# Patient Record
Sex: Male | Born: 1980 | Race: White | Hispanic: No | Marital: Married
Health system: Southern US, Academic
[De-identification: ages and names within clinical notes are randomized; demographics above are authoritative.]

## PROBLEM LIST (undated history)

## (undated) ENCOUNTER — Encounter: Attending: Internal Medicine | Primary: Internal Medicine

## (undated) ENCOUNTER — Encounter: Attending: Adult Health | Primary: Adult Health

## (undated) ENCOUNTER — Ambulatory Visit: Payer: MEDICAID

## (undated) ENCOUNTER — Telehealth

## (undated) ENCOUNTER — Encounter

## (undated) ENCOUNTER — Telehealth: Attending: Infectious Disease | Primary: Infectious Disease

## (undated) ENCOUNTER — Encounter: Attending: Hematology | Primary: Hematology

## (undated) ENCOUNTER — Ambulatory Visit

## (undated) ENCOUNTER — Encounter: Attending: Infectious Disease | Primary: Infectious Disease

## (undated) ENCOUNTER — Encounter
Attending: Student in an Organized Health Care Education/Training Program | Primary: Student in an Organized Health Care Education/Training Program

## (undated) ENCOUNTER — Ambulatory Visit: Payer: MEDICAID | Attending: Vascular Surgery | Primary: Vascular Surgery

## (undated) ENCOUNTER — Ambulatory Visit: Payer: MEDICAID | Attending: Adult Health | Primary: Adult Health

## (undated) ENCOUNTER — Ambulatory Visit: Payer: MEDICAID | Attending: Infectious Disease | Primary: Infectious Disease

## (undated) ENCOUNTER — Ambulatory Visit
Payer: MEDICAID | Attending: Student in an Organized Health Care Education/Training Program | Primary: Student in an Organized Health Care Education/Training Program

## (undated) ENCOUNTER — Telehealth: Attending: Adult Health | Primary: Adult Health

## (undated) ENCOUNTER — Encounter: Attending: Pharmacist | Primary: Pharmacist

## (undated) ENCOUNTER — Other Ambulatory Visit

## (undated) ENCOUNTER — Ambulatory Visit: Payer: MEDICAID | Attending: Hematology | Primary: Hematology

## (undated) ENCOUNTER — Telehealth
Attending: Student in an Organized Health Care Education/Training Program | Primary: Student in an Organized Health Care Education/Training Program

## (undated) ENCOUNTER — Telehealth: Attending: Dermatology | Primary: Dermatology

## (undated) ENCOUNTER — Encounter: Attending: Surgery | Primary: Surgery

## (undated) ENCOUNTER — Ambulatory Visit: Payer: MEDICAID | Attending: Physical Medicine & Rehabilitation | Primary: Physical Medicine & Rehabilitation

## (undated) ENCOUNTER — Encounter: Attending: Dermatology | Primary: Dermatology

## (undated) ENCOUNTER — Ambulatory Visit: Payer: MEDICAID | Attending: Internal Medicine | Primary: Internal Medicine

## (undated) ENCOUNTER — Encounter: Attending: General Acute Care Hospital | Primary: General Acute Care Hospital

## (undated) ENCOUNTER — Telehealth: Attending: Internal Medicine | Primary: Internal Medicine

## (undated) ENCOUNTER — Ambulatory Visit: Payer: MEDICAID | Attending: Registered" | Primary: Registered"

## (undated) ENCOUNTER — Encounter: Attending: Psychiatric/Mental Health | Primary: Psychiatric/Mental Health

## (undated) ENCOUNTER — Ambulatory Visit: Attending: Physical Medicine & Rehabilitation | Primary: Physical Medicine & Rehabilitation

## (undated) ENCOUNTER — Telehealth: Attending: Pharmacist | Primary: Pharmacist

## (undated) ENCOUNTER — Encounter: Attending: Vascular Surgery | Primary: Vascular Surgery

## (undated) ENCOUNTER — Encounter: Attending: Hematology & Oncology | Primary: Hematology & Oncology

## (undated) ENCOUNTER — Ambulatory Visit: Payer: MEDICAID | Attending: Otolaryngology | Primary: Otolaryngology

## (undated) ENCOUNTER — Telehealth: Attending: Surgery | Primary: Surgery

## (undated) ENCOUNTER — Ambulatory Visit: Attending: Psychiatric/Mental Health | Primary: Psychiatric/Mental Health

## (undated) ENCOUNTER — Ambulatory Visit
Payer: MEDICAID | Attending: Rehabilitative and Restorative Service Providers" | Primary: Rehabilitative and Restorative Service Providers"

## (undated) ENCOUNTER — Telehealth: Attending: Hematology | Primary: Hematology

## (undated) ENCOUNTER — Encounter: Attending: Ophthalmology | Primary: Ophthalmology

## (undated) ENCOUNTER — Inpatient Hospital Stay

## (undated) ENCOUNTER — Ambulatory Visit: Payer: MEDICAID | Attending: Surgery | Primary: Surgery

## (undated) ENCOUNTER — Institutional Professional Consult (permissible substitution): Payer: MEDICAID | Attending: Pharmacist | Primary: Pharmacist

## (undated) ENCOUNTER — Telehealth: Attending: Registered" | Primary: Registered"

## (undated) ENCOUNTER — Encounter: Attending: Registered" | Primary: Registered"

## (undated) ENCOUNTER — Encounter: Attending: Clinical | Primary: Clinical

## (undated) ENCOUNTER — Ambulatory Visit: Payer: MEDICAID | Attending: Pharmacist | Primary: Pharmacist

## (undated) DIAGNOSIS — G4730 Sleep apnea, unspecified: Secondary | ICD-10-CM

## (undated) DIAGNOSIS — C801 Malignant (primary) neoplasm, unspecified: Secondary | ICD-10-CM

## (undated) DIAGNOSIS — Z87442 Personal history of urinary calculi: Secondary | ICD-10-CM

## (undated) DIAGNOSIS — F419 Anxiety disorder, unspecified: Secondary | ICD-10-CM

## (undated) DIAGNOSIS — R519 Headache, unspecified: Secondary | ICD-10-CM

## (undated) DIAGNOSIS — N200 Calculus of kidney: Secondary | ICD-10-CM

## (undated) DIAGNOSIS — R51 Headache: Secondary | ICD-10-CM

## (undated) HISTORY — PX: WISDOM TOOTH EXTRACTION: SHX21

## (undated) HISTORY — DX: Malignant (primary) neoplasm, unspecified: C80.1

## (undated) HISTORY — PX: APPENDECTOMY: SHX54

## (undated) MED ORDER — TRAMADOL 50 MG TABLET: Freq: Two times a day (BID) | ORAL | 0.00000 days | Status: SS | PRN

## (undated) MED ORDER — GABAPENTIN 300 MG CAPSULE: Freq: Three times a day (TID) | ORAL | 0 days

## (undated) MED ORDER — HYDROXYZINE HCL 25 MG TABLET: Freq: Three times a day (TID) | ORAL | 0 days

## (undated) NOTE — *Deleted (*Deleted)
History and Physical    Brad Mcfarland VOZ:[id number] DOB: April 25, 1980 DOA: 01/20/2020  PCP: Mick Sell, MD   Patient coming from: Home  I have personally briefly reviewed patient's old medical records in [facility]  Chief Complaint: Dizziness/fatigue  HPI: Brad Mcfarland is a 69 y.o. male with medical history significant for anxiety disorder, PTSD, history of migraines and kidney stones who presents to the emergency room with a 5-day history of cough, myalgias, fatigue, dizziness and chest pain.  He denies having any fever but complains of excessive sweating.  He has not had any improvement despite taking DayQuil and NyQuil. He denies having any sick contacts, denies having any nausea, vomiting, abdominal pain or changes in his bowel habits.  He denies having any urinary symptoms. Labs show sodium 134, potassium 4.1, chloride 98, bicarb 22, BUN 17, creatinine 1.37, calcium 9.2, alkaline phosphatase 248, albumin 3.9, AST 41, ALT 58, total protein 7.6, total bilirubin 3.4, lactic acid 2.2 >> 1.8, white count 68,000 with marked left shift, hemoglobin 10.1, hematocrit 28.7, MCV 82.9, RDW 16.5, platelet count 21,000 Respiratory viral panel still pending Twelve-lead EKG reviewed by me shows sinus rhythm.    ED Course: ***  Review of Systems: As per HPI otherwise 10 point review of systems negative. ***   Past Medical History:  Diagnosis Date  . Anxiety    hx of panic attacks  . Headache    hx of migraines  . History of kidney stones   . Kidney stones   . Sleep apnea     Past Surgical History:  Procedure Laterality Date  . APPENDECTOMY    . KNEE ARTHROSCOPY Left 04/22/2017  . UMBILICAL HERNIA REPAIR N/A 04/11/2018   Procedure: HERNIA REPAIR UMBILICAL WITH MESH;  Surgeon: Sung Amabile, DO;  Location: [facility] ORS;  Service: General;  Laterality: N/A;  . WISDOM TOOTH EXTRACTION       reports that he has been smoking cigarettes. He has been smoking about 2.00 packs per  day. He has never used smokeless tobacco. He reports current alcohol use. He reports that he does not use drugs.  Allergies  Allergen Reactions  . Pepto-Bismol [Bismuth] Nausea And Vomiting    No family history on file.   Prior to Admission medications   Medication Sig Start Date End Date Taking? Authorizing Provider  ALPRAZolam (XANAX) 0.25 MG tablet Take 0.25 mg by mouth 2 (two) times daily as needed.  01/09/20   [provider]  baclofen (LIORESAL) 10 MG tablet Take 1 tablet (10 mg total) by mouth daily. Patient not taking: Reported on 01/20/2020 04/24/19 04/23/20  Faythe Ghee, PA-C  buPROPion (WELLBUTRIN XL) 150 MG 24 hr tablet Take 150 mg by mouth daily. 10/30/19   [provider]  DULoxetine HCl 40 MG CPEP Take 1 capsule by mouth daily. 01/14/20   [provider]  gabapentin (NEURONTIN) 300 MG capsule Take 300 mg by mouth at bedtime. 10/15/19   [provider]  HYDROcodone-acetaminophen (NORCO/VICODIN) 5-325 MG tablet Take 1 tablet by mouth every 6 (six) hours as needed for moderate pain. Patient not taking: Reported on 01/20/2020 04/24/19   Faythe Ghee, PA-C  predniSONE (STERAPRED UNI-PAK 48 TAB) 10 MG (48) TBPK tablet Take 6 pills for 2 days, 5 pills x 2d, 4 pills x 2d, 3 pills x 2d, 2 pills x 2d, 1 pill x 2d Patient not taking: Reported on 01/20/2020 04/24/19   Faythe Ghee, PA-C  traMADol (ULTRAM) 50 MG  tablet Take 50 mg by mouth daily as needed for moderate pain.  12/23/19   [provider]    Physical Exam: Vitals:   01/20/20 1405 01/20/20 1535 01/20/20 1550 01/20/20 1600  BP:  140/85 138/82 (!) 151/89  Pulse:  100 97 100  Resp:  16  (!) 24  Temp:      TempSrc:      SpO2:  100% 99% 98%  Weight: 124.7 kg     Height: 6\' 2"  (1.88 m)        Vitals:   01/20/20 1405 01/20/20 1535 01/20/20 1550 01/20/20 1600  BP:  140/85 138/82 (!) 151/89  Pulse:  100 97 100  Resp:  16  (!) 24  Temp:      TempSrc:      SpO2:  100% 99%  98%  Weight: 124.7 kg     Height: 6\' 2"  (1.88 m)       Constitutional: NAD, alert and oriented x *** Eyes: PERRL, lids and conjunctivae normal ENMT: Mucous membranes are moist.  Neck: normal, supple, no masses, no thyromegaly Respiratory: clear to auscultation bilaterally, no wheezing, no crackles. Normal respiratory effort. No accessory muscle use. *** Cardiovascular: Regular rate and rhythm,*** no murmurs / rubs / gallops. No extremity edema. 2+ pedal pulses. No carotid bruits. *** Abdomen: no tenderness, no masses palpated. No hepatosplenomegaly. Bowel sounds positive.  Musculoskeletal: no clubbing / cyanosis. No joint deformity upper and lower extremities.  Skin: no rashes, lesions, ulcers.  Neurologic: No gross focal neurologic deficit.*** Psychiatric: Normal mood and affect.   Labs on Admission: I have personally reviewed following labs and imaging studies  CBC: Recent Labs  Lab 01/20/20 1416  WBC 68.0*  NEUTROABS 0.7*  HGB 10.1*  HCT 28.7*  MCV 82.9  PLT 21*   Basic Metabolic Panel: Recent Labs  Lab 01/20/20 1416  NA 134*  K 4.1  CL 98  CO2 22  GLUCOSE 96  BUN 17  CREATININE 1.37*  CALCIUM 9.2   GFR: Estimated Creatinine Clearance: 100.6 mL/min (A) (by C-G formula based on SCr of 1.37 mg/dL (H)). Liver Function Tests: Recent Labs  Lab 01/20/20 1416  AST 41  ALT 58*  ALKPHOS 248*  BILITOT 3.4*  PROT 7.6  ALBUMIN 3.9   No results for input(s): LIPASE, AMYLASE in the last 168 hours. No results for input(s): AMMONIA in the last 168 hours. Coagulation Profile: No results for input(s): INR, PROTIME in the last 168 hours. Cardiac Enzymes: No results for input(s): CKTOTAL, CKMB, CKMBINDEX, TROPONINI in the last 168 hours. BNP (last 3 results) No results for input(s): PROBNP in the last 8760 hours. HbA1C: No results for input(s): HGBA1C in the last 72 hours. CBG: No results for input(s): GLUCAP in the last 168 hours. Lipid Profile: No results for  input(s): CHOL, HDL, LDLCALC, TRIG, CHOLHDL, LDLDIRECT in the last 72 hours. Thyroid Function Tests: No results for input(s): TSH, T4TOTAL, FREET4, T3FREE, THYROIDAB in the last 72 hours. Anemia Panel: No results for input(s): VITAMINB12, FOLATE, FERRITIN, TIBC, IRON, RETICCTPCT in the last 72 hours. Urine analysis:    Component Value Date/Time   COLORURINE YELLOW (A) 04/07/2018 1827   APPEARANCEUR CLEAR (A) 04/07/2018 1827   APPEARANCEUR Clear 12/05/2013 1016   LABSPEC 1.025 04/07/2018 1827   LABSPEC 1.021 12/05/2013 1016   PHURINE 5.0 04/07/2018 1827   GLUCOSEU NEGATIVE 04/07/2018 1827   GLUCOSEU Negative 12/05/2013 1016   HGBUR NEGATIVE 04/07/2018 1827   BILIRUBINUR NEGATIVE 04/07/2018 1827  BILIRUBINUR Negative 12/05/2013 1016   KETONESUR NEGATIVE 04/07/2018 1827   PROTEINUR NEGATIVE 04/07/2018 1827   NITRITE NEGATIVE 04/07/2018 1827   LEUKOCYTESUR NEGATIVE 04/07/2018 1827   LEUKOCYTESUR Negative 12/05/2013 1016    Radiological Exams on Admission: DG Chest 2 View  Result Date: 01/20/2020 CLINICAL DATA:  Chest pain. EXAM: CHEST - 2 VIEW COMPARISON:  July 02, 2016 FINDINGS: The heart size and mediastinal contours are within normal limits. Both lungs are clear. No pleural effusions or pneumothorax. The visualized skeletal structures are unremarkable. IMPRESSION: No acute cardiopulmonary disease. Electronically Signed   By: Feliberto Harts MD   On: 01/20/2020 15:17    EKG: Independently reviewed. ***  Assessment/Plan Active Problems:   Leukocytosis      DVT prophylaxis: ***  Code Status: ***  Family Communication: ***  Disposition Plan: Back to previous home environment Consults called: ***     Tochukwu Agbata MD [facility]     01/20/2020, 5:10 PM

---

## 2001-09-13 ENCOUNTER — Encounter: Payer: Self-pay | Admitting: Specialist

## 2001-09-13 ENCOUNTER — Ambulatory Visit: Admission: RE | Admit: 2001-09-13 | Discharge: 2001-09-14 | Payer: Self-pay | Admitting: *Deleted

## 2012-10-17 ENCOUNTER — Emergency Department: Payer: Self-pay | Admitting: Emergency Medicine

## 2012-10-17 LAB — COMPREHENSIVE METABOLIC PANEL
Albumin: 4.4 g/dL (ref 3.4–5.0)
Alkaline Phosphatase: 89 U/L (ref 50–136)
BUN: 15 mg/dL (ref 7–18)
Co2: 26 mmol/L (ref 21–32)
Creatinine: 1.23 mg/dL (ref 0.60–1.30)
EGFR (African American): >60
EGFR (Non-African Amer.): >60
Glucose: 94 mg/dL (ref 65–99)
Osmolality: 278 (ref 275–301)
Potassium: 3.8 mmol/L (ref 3.5–5.1)
SGOT(AST): 21 U/L (ref 15–37)
Sodium: 139 mmol/L (ref 136–145)
Total Protein: 7.9 g/dL (ref 6.4–8.2)

## 2012-10-17 LAB — CBC
HCT: 45.6 % (ref 40.0–52.0)
MCH: 29.7 pg (ref 26.0–34.0)
MCHC: 35.4 g/dL (ref 32.0–36.0)
Platelet: 196 10*3/uL (ref 150–440)
RBC: 5.44 10*6/uL (ref 4.40–5.90)
WBC: 8.6 10*3/uL (ref 3.8–10.6)

## 2012-10-17 LAB — URINALYSIS, COMPLETE
Bacteria: NONE SEEN
Bilirubin,UR: NEGATIVE
Glucose,UR: NEGATIVE mg/dL (ref 0–75)
Leukocyte Esterase: NEGATIVE
Nitrite: NEGATIVE
Ph: 6 (ref 4.5–8.0)
RBC,UR: 1 /HPF (ref 0–5)
Squamous Epithelial: NONE SEEN

## 2012-10-26 ENCOUNTER — Ambulatory Visit: Payer: Self-pay | Admitting: Neurology

## 2012-12-10 ENCOUNTER — Emergency Department: Payer: Self-pay | Admitting: Emergency Medicine

## 2012-12-10 LAB — URINALYSIS, COMPLETE
Bacteria: NONE SEEN
Bilirubin,UR: NEGATIVE
Glucose,UR: NEGATIVE mg/dL (ref 0–75)
Nitrite: NEGATIVE
Ph: 5 (ref 4.5–8.0)
Protein: 30
Specific Gravity: 1.021 (ref 1.003–1.030)
Squamous Epithelial: NONE SEEN
WBC UR: 2 /HPF (ref 0–5)

## 2012-12-10 LAB — COMPREHENSIVE METABOLIC PANEL
Albumin: 3.9 g/dL (ref 3.4–5.0)
Anion Gap: 6 — ABNORMAL LOW (ref 7–16)
Bilirubin,Total: 0.5 mg/dL (ref 0.2–1.0)
Chloride: 110 mmol/L — ABNORMAL HIGH (ref 98–107)
Creatinine: 1.14 mg/dL (ref 0.60–1.30)
EGFR (Non-African Amer.): >60
Glucose: 99 mg/dL (ref 65–99)
Potassium: 3.9 mmol/L (ref 3.5–5.1)
Sodium: 139 mmol/L (ref 136–145)
Total Protein: 7.1 g/dL (ref 6.4–8.2)

## 2012-12-10 LAB — CBC
MCH: 29.7 pg (ref 26.0–34.0)
MCHC: 34.8 g/dL (ref 32.0–36.0)
MCV: 85 fL (ref 80–100)
RBC: 5.42 10*6/uL (ref 4.40–5.90)
RDW: 13.3 % (ref 11.5–14.5)

## 2012-12-13 ENCOUNTER — Emergency Department: Payer: Self-pay | Admitting: Internal Medicine

## 2012-12-13 LAB — URINALYSIS, COMPLETE
Bacteria: NONE SEEN
Bilirubin,UR: NEGATIVE
Blood: NEGATIVE
Glucose,UR: NEGATIVE mg/dL
Ketone: NEGATIVE
Leukocyte Esterase: NEGATIVE
Nitrite: NEGATIVE
Ph: 8
Protein: NEGATIVE
RBC,UR: 4 /HPF
Specific Gravity: 1.014
Squamous Epithelial: NONE SEEN
WBC UR: 1 /HPF

## 2012-12-13 LAB — CBC
HCT: 47.9 %
HGB: 16.9 g/dL
MCH: 29.6 pg
MCHC: 35.2 g/dL
MCV: 84 fL
Platelet: 189 x10 3/mm 3
RBC: 5.7 x10 6/mm 3
RDW: 12.9 %
WBC: 7.4 x10 3/mm 3

## 2012-12-13 LAB — COMPREHENSIVE METABOLIC PANEL
Albumin: 4.4 g/dL (ref 3.4–5.0)
Alkaline Phosphatase: 76 U/L (ref 50–136)
Anion Gap: 5 — ABNORMAL LOW (ref 7–16)
BUN: 11 mg/dL (ref 7–18)
Chloride: 104 mmol/L (ref 98–107)
Creatinine: 1.24 mg/dL (ref 0.60–1.30)
EGFR (African American): >60
EGFR (Non-African Amer.): >60
Glucose: 94 mg/dL (ref 65–99)
Osmolality: 273 (ref 275–301)
Potassium: 4.1 mmol/L (ref 3.5–5.1)
SGOT(AST): 26 U/L (ref 15–37)
SGPT (ALT): 33 U/L (ref 12–78)
Total Protein: 8 g/dL (ref 6.4–8.2)

## 2013-08-07 ENCOUNTER — Ambulatory Visit: Payer: Self-pay | Admitting: Family Medicine

## 2013-12-05 ENCOUNTER — Emergency Department: Payer: Self-pay | Admitting: Internal Medicine

## 2013-12-05 LAB — COMPREHENSIVE METABOLIC PANEL
ALBUMIN: 4.1 g/dL (ref 3.4–5.0)
Alkaline Phosphatase: 74 U/L
Anion Gap: 3 — ABNORMAL LOW (ref 7–16)
BILIRUBIN TOTAL: 0.6 mg/dL (ref 0.2–1.0)
BUN: 16 mg/dL (ref 7–18)
CO2: 27 mmol/L (ref 21–32)
CREATININE: 1.22 mg/dL (ref 0.60–1.30)
Calcium, Total: 8.8 mg/dL (ref 8.5–10.1)
Chloride: 108 mmol/L — ABNORMAL HIGH (ref 98–107)
EGFR (Non-African Amer.): >60
Glucose: 88 mg/dL (ref 65–99)
Osmolality: 276 (ref 275–301)
Potassium: 3.9 mmol/L (ref 3.5–5.1)
SGOT(AST): 17 U/L (ref 15–37)
SGPT (ALT): 26 U/L
SODIUM: 138 mmol/L (ref 136–145)
Total Protein: 7.7 g/dL (ref 6.4–8.2)

## 2013-12-05 LAB — URINALYSIS, COMPLETE
Bacteria: NONE SEEN
Bilirubin,UR: NEGATIVE
Glucose,UR: NEGATIVE mg/dL (ref 0–75)
Hyaline Cast: 1
KETONE: NEGATIVE
Leukocyte Esterase: NEGATIVE
Nitrite: NEGATIVE
Ph: 6 (ref 4.5–8.0)
Protein: NEGATIVE
RBC,UR: 296 /HPF (ref 0–5)
SPECIFIC GRAVITY: 1.021 (ref 1.003–1.030)
Squamous Epithelial: <1
WBC UR: 1 /HPF (ref 0–5)

## 2013-12-05 LAB — CBC
HCT: 46.1 % (ref 40.0–52.0)
HGB: 15.2 g/dL (ref 13.0–18.0)
MCH: 29.3 pg (ref 26.0–34.0)
MCHC: 33 g/dL (ref 32.0–36.0)
MCV: 89 fL (ref 80–100)
PLATELETS: 193 10*3/uL (ref 150–440)
RBC: 5.2 10*6/uL (ref 4.40–5.90)
RDW: 13.3 % (ref 11.5–14.5)
WBC: 8.3 10*3/uL (ref 3.8–10.6)

## 2013-12-27 ENCOUNTER — Ambulatory Visit: Payer: Self-pay | Admitting: Unknown Physician Specialty

## 2014-01-17 ENCOUNTER — Emergency Department: Payer: Self-pay | Admitting: Emergency Medicine

## 2014-05-09 ENCOUNTER — Emergency Department (HOSPITAL_COMMUNITY): Payer: Self-pay

## 2014-05-09 ENCOUNTER — Emergency Department (HOSPITAL_COMMUNITY)
Admission: EM | Admit: 2014-05-09 | Discharge: 2014-05-10 | Disposition: A | Discharge status: Home / Self Care | Payer: Self-pay | Attending: Emergency Medicine | Admitting: Emergency Medicine

## 2014-05-09 ENCOUNTER — Encounter (HOSPITAL_COMMUNITY): Payer: Self-pay | Admitting: Vascular Surgery

## 2014-05-09 DIAGNOSIS — S9002XA Contusion of left ankle, initial encounter: Secondary | ICD-10-CM | POA: Insufficient documentation

## 2014-05-09 DIAGNOSIS — S300XXA Contusion of lower back and pelvis, initial encounter: Secondary | ICD-10-CM | POA: Insufficient documentation

## 2014-05-09 DIAGNOSIS — S20229A Contusion of unspecified back wall of thorax, initial encounter: Secondary | ICD-10-CM | POA: Insufficient documentation

## 2014-05-09 DIAGNOSIS — W1789XA Other fall from one level to another, initial encounter: Secondary | ICD-10-CM | POA: Insufficient documentation

## 2014-05-09 DIAGNOSIS — Y998 Other external cause status: Secondary | ICD-10-CM | POA: Insufficient documentation

## 2014-05-09 DIAGNOSIS — W19XXXA Unspecified fall, initial encounter: Secondary | ICD-10-CM

## 2014-05-09 DIAGNOSIS — S199XXA Unspecified injury of neck, initial encounter: Secondary | ICD-10-CM | POA: Insufficient documentation

## 2014-05-09 DIAGNOSIS — Y9289 Other specified places as the place of occurrence of the external cause: Secondary | ICD-10-CM | POA: Insufficient documentation

## 2014-05-09 DIAGNOSIS — Z720 Tobacco use: Secondary | ICD-10-CM | POA: Insufficient documentation

## 2014-05-09 DIAGNOSIS — Y9389 Activity, other specified: Secondary | ICD-10-CM | POA: Insufficient documentation

## 2014-05-09 DIAGNOSIS — S0990XA Unspecified injury of head, initial encounter: Secondary | ICD-10-CM | POA: Insufficient documentation

## 2014-05-09 LAB — CBC WITH DIFFERENTIAL/PLATELET
Basophils Absolute: 0.1 10*3/uL (ref 0.0–0.1)
Basophils Relative: 1 % (ref 0–1)
EOS ABS: 0.1 10*3/uL (ref 0.0–0.7)
EOS PCT: 1 % (ref 0–5)
HCT: 46.1 % (ref 39.0–52.0)
HEMOGLOBIN: 16 g/dL (ref 13.0–17.0)
LYMPHS ABS: 4.8 10*3/uL — AB (ref 0.7–4.0)
Lymphocytes Relative: 34 % (ref 12–46)
MCH: 29.7 pg (ref 26.0–34.0)
MCHC: 34.7 g/dL (ref 30.0–36.0)
MCV: 85.7 fL (ref 78.0–100.0)
Monocytes Absolute: 0.7 10*3/uL (ref 0.1–1.0)
Monocytes Relative: 5 % (ref 3–12)
Neutro Abs: 8.3 10*3/uL — ABNORMAL HIGH (ref 1.7–7.7)
Neutrophils Relative %: 59 % (ref 43–77)
Platelets: 213 10*3/uL (ref 150–400)
RBC: 5.38 MIL/uL (ref 4.22–5.81)
RDW: 13.4 % (ref 11.5–15.5)
WBC: 14 10*3/uL — AB (ref 4.0–10.5)

## 2014-05-09 MED ORDER — ONDANSETRON HCL 4 MG/2ML IJ SOLN
4.0000 mg | Freq: Once | INTRAMUSCULAR | Status: AC
  Administered 2014-05-09: 4 mg via INTRAVENOUS
  Filled 2014-05-09: qty 2

## 2014-05-09 MED ORDER — HYDROMORPHONE HCL 1 MG/ML IJ SOLN
1.0000 mg | Freq: Once | INTRAMUSCULAR | Status: AC
  Administered 2014-05-09: 1 mg via INTRAVENOUS
  Filled 2014-05-09: qty 1

## 2014-05-09 MED ORDER — SODIUM CHLORIDE 0.9 % IV SOLN
Freq: Once | INTRAVENOUS | Status: AC
  Administered 2014-05-09: 23:00:00 via INTRAVENOUS

## 2014-05-09 NOTE — ED Provider Notes (Addendum)
CSN: [id number]     Arrival date & time 05/09/14  2211 History   First MD Initiated Contact with Patient 05/09/14 2211     Chief Complaint  Patient presents with  . Fall     (Consider location/radiation/quality/duration/timing/severity/associated sxs/prior Treatment) HPI Comments: [place] through ceiling hitting head, neck, back on beams/ladder during the fall also having pain in left ankle   Denies LOC Transported via EMS in full immobilization  Patient is covered in Education officer, museum   Patient is a 34 y.o. male presenting with fall. The history is provided by the patient.  Fall This is a new problem. The problem occurs constantly. The problem has been unchanged. Associated symptoms include arthralgias, headaches, joint swelling and neck pain. Pertinent negatives include no abdominal pain, fever, numbness or weakness. The symptoms are aggravated by exertion. He has tried immobilization for the symptoms. The treatment provided no relief.    History reviewed. No pertinent past medical history. Past Surgical History  Procedure Laterality Date  . Appendectomy    . Wisdom tooth extraction     No family history on file. History  Substance Use Topics  . Smoking status: Heavy Tobacco Smoker -- 2.00 packs/day    Types: Cigarettes  . Smokeless tobacco: Never Used  . Alcohol Use: No    Review of Systems  Constitutional: Negative for fever.  Gastrointestinal: Negative for abdominal pain.  Musculoskeletal: Positive for back pain, joint swelling, arthralgias and neck pain.  Skin: Negative for wound.  Neurological: Positive for headaches. Negative for dizziness, weakness and numbness.  All other systems reviewed and are negative.     Allergies  Review of patient's allergies indicates not on file.  Home Medications   Prior to Admission medications   Medication Sig Start Date End Date Taking? Authorizing Provider  cyclobenzaprine (FLEXERIL) 5 MG tablet Take 1 tablet (5 mg total)  by mouth 3 (three) times daily as needed for muscle spasms. 05/10/14   Garald Balding, NP  oxyCODONE-acetaminophen (PERCOCET/ROXICET) 5-325 MG per tablet Take 1 tablet by mouth every 6 (six) hours as needed for severe pain. 05/10/14   Garald Balding, NP   BP 130/84 mmHg  Pulse 77  Resp 15  SpO2 98% Physical Exam  Constitutional: He is oriented to person, place, and time. He appears well-developed and well-nourished.  HENT:  Head: Normocephalic.  Neck: Spinous process tenderness and muscular tenderness present.    Cardiovascular: Normal rate and regular rhythm.   Pulmonary/Chest: Effort normal and breath sounds normal.  Abdominal: Soft. Bowel sounds are normal.  Musculoskeletal: He exhibits edema and tenderness.       Cervical back: He exhibits tenderness and pain. He exhibits no edema and no deformity.       Back:  Neurological: He is alert and oriented to person, place, and time.  Skin: Skin is warm and dry. No erythema.  Psychiatric: His behavior is normal.  Nursing note and vitals reviewed.   ED Course  Procedures (including critical care time) Labs Review Labs Reviewed  CBC WITH DIFFERENTIAL/PLATELET - Abnormal; Notable for the following:    WBC 14.0 (*)    Neutro Abs 8.3 (*)    Lymphs Abs 4.8 (*)    All other components within normal limits  I-STAT CHEM 8, ED    Imaging Review Dg Thoracic Spine 2 View  05/09/2014   CLINICAL DATA:  Thoracic back pain after fall.  EXAM: THORACIC SPINE - 2 VIEW  COMPARISON:  Chest radiograph 01/17/2014  FINDINGS: The  alignment is maintained. Vertebral body heights are maintained. No significant disc space narrowing. Posterior elements appear intact. There is no paravertebral soft tissue abnormality.  IMPRESSION: No fracture or subluxation of the thoracic spine.   Electronically Signed   By: Jeb Levering M.D.   On: 05/09/2014 23:35   Dg Lumbar Spine Complete  05/09/2014   CLINICAL DATA:  Lumbar back pain after fall.  EXAM: LUMBAR SPINE -  COMPLETE 4+ VIEW  COMPARISON:  None.  FINDINGS: The alignment is maintained. Vertebral body heights are normal. There is no listhesis. The posterior elements are intact. Disc spaces are preserved. No fracture. Sacroiliac joints are symmetric.  IMPRESSION: No fracture or subluxation of the lumbar spine.   Electronically Signed   By: Jeb Levering M.D.   On: 05/09/2014 23:36   Dg Ankle Complete Left  05/09/2014   CLINICAL DATA:  Left ankle pain after fall.  EXAM: LEFT ANKLE COMPLETE - 3+ VIEW  COMPARISON:  None.  FINDINGS: No fracture or dislocation. The alignment and joint spaces are maintained. The ankle mortise is preserved. Accessory ossicle noted at the talonavicular joint. There is no focal soft tissue abnormality.  IMPRESSION: No fracture or dislocation of the left ankle.   Electronically Signed   By: Jeb Levering M.D.   On: 05/09/2014 23:37   Ct Head Wo Contrast  05/09/2014   CLINICAL DATA:  Head and neck pain after fall out of attic.  EXAM: CT HEAD WITHOUT CONTRAST  CT CERVICAL SPINE WITHOUT CONTRAST  TECHNIQUE: Multidetector CT imaging of the head and cervical spine was performed following the standard protocol without intravenous contrast. Multiplanar CT image reconstructions of the cervical spine were also generated.  COMPARISON:  Head CT 10/17/2012  FINDINGS: CT HEAD FINDINGS  No intracranial hemorrhage, mass effect, or midline shift. No hydrocephalus. The basilar cisterns are patent. No evidence of territorial infarct. No intracranial fluid collection. Calvarium is intact. There is progressive echoes all beginning involving the left maxillary sinus. Scattered mucosal thickening of the ethmoid air cells.  CT CERVICAL SPINE FINDINGS  Cervical spine alignment is maintained. Vertebral body heights and intervertebral disc spaces are preserved. The dens is intact. Posterior elements appear well-aligned. There is no evidence of fracture. No prevertebral soft tissue edema.  IMPRESSION: 1. No acute  intracranial abnormality. Progressive left maxillary sinus mucosal thickening. 2. No fracture or subluxation of the cervical spine.   Electronically Signed   By: Jeb Levering M.D.   On: 05/09/2014 23:56   Ct Cervical Spine Wo Contrast  05/09/2014   CLINICAL DATA:  Head and neck pain after fall out of attic.  EXAM: CT HEAD WITHOUT CONTRAST  CT CERVICAL SPINE WITHOUT CONTRAST  TECHNIQUE: Multidetector CT imaging of the head and cervical spine was performed following the standard protocol without intravenous contrast. Multiplanar CT image reconstructions of the cervical spine were also generated.  COMPARISON:  Head CT 10/17/2012  FINDINGS: CT HEAD FINDINGS  No intracranial hemorrhage, mass effect, or midline shift. No hydrocephalus. The basilar cisterns are patent. No evidence of territorial infarct. No intracranial fluid collection. Calvarium is intact. There is progressive echoes all beginning involving the left maxillary sinus. Scattered mucosal thickening of the ethmoid air cells.  CT CERVICAL SPINE FINDINGS  Cervical spine alignment is maintained. Vertebral body heights and intervertebral disc spaces are preserved. The dens is intact. Posterior elements appear well-aligned. There is no evidence of fracture. No prevertebral soft tissue edema.  IMPRESSION: 1. No acute intracranial abnormality. Progressive left maxillary sinus  mucosal thickening. 2. No fracture or subluxation of the cervical spine.   Electronically Signed   By: Jeb Levering M.D.   On: 05/09/2014 23:56     EKG Interpretation None      MDM   Final diagnoses:  Fall  Contusion of back wall of thorax, unspecified laterality, initial encounter  Contusion of lower back, initial encounter  Ankle contusion, left, initial encounter         Garald Balding, NP 05/09/14 [place], MD 05/09/14 2331  Garald Balding, NP 05/10/14 [id number]  Orpah Greek, MD 05/10/14 [phone]

## 2014-05-09 NOTE — ED Notes (Signed)
Pt reports to the ED for eval of posterior head, low back, and neck pain. Pt arrives with LSB and C-collar in place. Pt fell through an attic ceiling onto the floor below. Estimated fall is approx 10 ft. Denies any LOC, numbness, tingling, or paralysis. Insulation noted to entire body and clothing. Pt able to move all extremities. Pt A&Ox4, resp e/u, and skin warm and dry.

## 2014-05-10 MED ORDER — OXYCODONE-ACETAMINOPHEN 5-325 MG PO TABS
1.0000 | ORAL_TABLET | Freq: Once | ORAL | Status: AC
  Administered 2014-05-10: 1 via ORAL
  Filled 2014-05-10: qty 1

## 2014-05-10 MED ORDER — OXYCODONE-ACETAMINOPHEN 5-325 MG PO TABS: 1.0000 | ORAL_TABLET | Freq: Four times a day (QID) | ORAL | Status: DC | PRN

## 2014-05-10 MED ORDER — CYCLOBENZAPRINE HCL 10 MG PO TABS
5.0000 mg | ORAL_TABLET | Freq: Once | ORAL | Status: AC
  Administered 2014-05-10: 5 mg via ORAL
  Filled 2014-05-10: qty 1

## 2014-05-10 MED ORDER — CYCLOBENZAPRINE HCL 5 MG PO TABS: 5.0000 mg | ORAL_TABLET | Freq: Three times a day (TID) | ORAL | Status: DC | PRN

## 2014-05-10 NOTE — Discharge Instructions (Signed)
Contusion °A contusion is a deep bruise. Contusions happen when an injury causes bleeding under the skin. Signs of bruising include pain, puffiness (swelling), and discolored skin. The contusion may turn blue, purple, or yellow. °HOME CARE  °· Put ice on the injured area. °¨ Put ice in a plastic bag. °¨ Place a towel between your skin and the bag. °¨ Leave the ice on for 15-20 minutes, 03-04 times a day. °· Only take medicine as told by your doctor. °· Rest the injured area. °· If possible, raise (elevate) the injured area to lessen puffiness. °GET HELP RIGHT AWAY IF:  °· You have more bruising or puffiness. °· You have pain that is getting worse. °· Your puffiness or pain is not helped by medicine. °MAKE SURE YOU:  °· Understand these instructions. °· Will watch your condition. °· Will get help right away if you are not doing well or get worse. °Document Released: 09/07/2007 Document Revised: 06/13/2011 Document Reviewed: 01/24/2011 °[employer]® Patient Information ©2015 [employer]. This information is not intended to replace advice given to you by your health care provider. Make sure you discuss any questions you have with your health care provider. ° °Cryotherapy °Cryotherapy means treatment with cold. Ice or gel packs can be used to reduce both pain and swelling. Ice is the most helpful within the first 24 to 48 hours after an injury or flare-up from overusing a muscle or joint. Sprains, strains, spasms, burning pain, shooting pain, and aches can all be eased with ice. Ice can also be used when recovering from surgery. Ice is effective, has very few side effects, and is safe for most people to use. °PRECAUTIONS  °Ice is not a safe treatment option for people with: °· Raynaud phenomenon. This is a condition affecting small blood vessels in the extremities. Exposure to cold may cause your problems to return. °· Cold hypersensitivity. There are many forms of cold hypersensitivity, including: °¨ Cold urticaria.  Red, itchy hives appear on the skin when the tissues begin to warm after being iced. °¨ Cold erythema. This is a red, itchy rash caused by exposure to cold. °¨ Cold hemoglobinuria. Red blood cells break down when the tissues begin to warm after being iced. The hemoglobin that carry oxygen are passed into the urine because they cannot combine with blood proteins fast enough. °· Numbness or altered sensitivity in the area being iced. °If you have any of the following conditions, do not use ice until you have discussed cryotherapy with your caregiver: °· Heart conditions, such as arrhythmia, angina, or chronic heart disease. °· High blood pressure. °· Healing wounds or open skin in the area being iced. °· Current infections. °· Rheumatoid arthritis. °· Poor circulation. °· Diabetes. °Ice slows the blood flow in the region it is applied. This is beneficial when trying to stop inflamed tissues from spreading irritating chemicals to surrounding tissues. However, if you expose your skin to cold temperatures for too long or without the proper protection, you can damage your skin or nerves. Watch for signs of skin damage due to cold. °HOME CARE INSTRUCTIONS °Follow these tips to use ice and cold packs safely. °· Place a dry or damp towel between the ice and skin. A damp towel will cool the skin more quickly, so you may need to shorten the time that the ice is used. °· For a more rapid response, add gentle compression to the ice. °· Ice for no more than 10 to 20 minutes at a time.   The bonier the area you are icing, the less time it will take to get the benefits of ice.  Check your skin after 5 minutes to make sure there are no signs of a poor response to cold or skin damage.  Rest 20 minutes or more between uses.  Once your skin is numb, you can end your treatment. You can test numbness by very lightly touching your skin. The touch should be so light that you do not see the skin dimple from the pressure of your  fingertip. When using ice, most people will feel these normal sensations in this order: cold, burning, aching, and numbness.  Do not use ice on someone who cannot communicate their responses to pain, such as small children or people with dementia. HOW TO MAKE AN ICE PACK Ice packs are the most common way to use ice therapy. Other methods include ice massage, ice baths, and cryosprays. Muscle creams that cause a cold, tingly feeling do not offer the same benefits that ice offers and should not be used as a substitute unless recommended by your caregiver. To make an ice pack, do one of the following:  Place crushed ice or a bag of frozen vegetables in a sealable plastic bag. Squeeze out the excess air. Place this bag inside another plastic bag. Slide the bag into a pillowcase or place a damp towel between your skin and the bag.  Mix 3 parts water with 1 part rubbing alcohol. Freeze the mixture in a sealable plastic bag. When you remove the mixture from the freezer, it will be slushy. Squeeze out the excess air. Place this bag inside another plastic bag. Slide the bag into a pillowcase or place a damp towel between your skin and the bag. SEEK MEDICAL CARE IF:  You develop white spots on your skin. This may give the skin a blotchy (mottled) appearance.  Your skin turns blue or pale.  Your skin becomes waxy or hard.  Your swelling gets worse. MAKE SURE YOU:   Understand these instructions.  Will watch your condition.  Will get help right away if you are not doing well or get worse. Document Released: 11/15/2010 Document Revised: 08/05/2013 Document Reviewed: 11/15/2010 [facility] Patient Information 2015 [place], [place]. This information is not intended to replace advice given to you by your health care provider. Make sure you discuss any questions you have with your health care provider. All your x rays and scan are normal You have been given prescriptions for pain and muscle relaxer

## 2014-08-20 ENCOUNTER — Encounter: Payer: Self-pay | Admitting: Emergency Medicine

## 2014-08-20 ENCOUNTER — Emergency Department
Admission: EM | Admit: 2014-08-20 | Discharge: 2014-08-20 | Disposition: A | Discharge status: Home / Self Care | Payer: No Typology Code available for payment source | Attending: Emergency Medicine | Admitting: Emergency Medicine

## 2014-08-20 DIAGNOSIS — S81831A Puncture wound without foreign body, right lower leg, initial encounter: Secondary | ICD-10-CM | POA: Diagnosis not present

## 2014-08-20 DIAGNOSIS — Y998 Other external cause status: Secondary | ICD-10-CM | POA: Diagnosis not present

## 2014-08-20 DIAGNOSIS — W57XXXA Bitten or stung by nonvenomous insect and other nonvenomous arthropods, initial encounter: Secondary | ICD-10-CM | POA: Insufficient documentation

## 2014-08-20 DIAGNOSIS — Y9389 Activity, other specified: Secondary | ICD-10-CM | POA: Insufficient documentation

## 2014-08-20 DIAGNOSIS — Y9289 Other specified places as the place of occurrence of the external cause: Secondary | ICD-10-CM | POA: Insufficient documentation

## 2014-08-20 DIAGNOSIS — Z720 Tobacco use: Secondary | ICD-10-CM | POA: Diagnosis not present

## 2014-08-20 DIAGNOSIS — S90561A Insect bite (nonvenomous), right ankle, initial encounter: Secondary | ICD-10-CM | POA: Insufficient documentation

## 2014-08-20 DIAGNOSIS — Z79899 Other long term (current) drug therapy: Secondary | ICD-10-CM | POA: Diagnosis not present

## 2014-08-20 MED ORDER — SULFAMETHOXAZOLE-TRIMETHOPRIM 800-160 MG PO TABS
1.0000 | ORAL_TABLET | Freq: Two times a day (BID) | ORAL | Status: DC
Start: 2014-08-20 — End: 2014-09-29

## 2014-08-20 MED ORDER — OXYCODONE-ACETAMINOPHEN 5-325 MG PO TABS
ORAL_TABLET | ORAL | Status: AC
  Filled 2014-08-20: qty 1

## 2014-08-20 MED ORDER — TRAMADOL HCL 50 MG PO TABS: 50.0000 mg | ORAL_TABLET | Freq: Four times a day (QID) | ORAL | Status: DC | PRN

## 2014-08-20 MED ORDER — BACITRACIN-NEOMYCIN-POLYMYXIN OINTMENT TUBE
TOPICAL_OINTMENT | Freq: Once | CUTANEOUS | Status: AC
  Administered 2014-08-20: 1 via TOPICAL

## 2014-08-20 MED ORDER — OXYCODONE-ACETAMINOPHEN 5-325 MG PO TABS
1.0000 | ORAL_TABLET | Freq: Once | ORAL | Status: AC
  Administered 2014-08-20: 1 via ORAL

## 2014-08-20 MED ORDER — BACITRACIN-NEOMYCIN-POLYMYXIN 400-5-5000 EX OINT
TOPICAL_OINTMENT | CUTANEOUS | Status: AC
  Filled 2014-08-20: qty 1

## 2014-08-20 NOTE — ED Notes (Signed)
Pt ambulates to stat desk with reports of ankle pain.

## 2014-08-20 NOTE — ED Provider Notes (Signed)
[facility] Emergency Department Provider Note  ____________________________________________  Time seen: Approximately 2:52 PM  I have reviewed the triage vital signs and the nursing notes.   HISTORY  Chief Complaint Insect Bite    HPI Brad Mcfarland is a 34 y.o. male patient plane of pain to the right ankle is rating of his knee. Patient state this insect bite on the anterior distal right leg with redness and swelling. Patient's rate is pain is 6/10. Patient denies any fever chills nausea vomiting associated with this incident.   History reviewed. No pertinent past medical history.  There are no active problems to display for this patient.   Past Surgical History  Procedure Laterality Date  . Appendectomy    . Wisdom tooth extraction      Current Outpatient Rx  Name  Route  Sig  Dispense  Refill  . cyclobenzaprine (FLEXERIL) 5 MG tablet   Oral   Take 1 tablet (5 mg total) by mouth 3 (three) times daily as needed for muscle spasms.   30 tablet   0   . oxyCODONE-acetaminophen (PERCOCET/ROXICET) 5-325 MG per tablet   Oral   Take 1 tablet by mouth every 6 (six) hours as needed for severe pain.   20 tablet   0   . sulfamethoxazole-trimethoprim (BACTRIM DS,SEPTRA DS) 800-160 MG per tablet   Oral   Take 1 tablet by mouth 2 (two) times daily.   20 tablet   0   . traMADol (ULTRAM) 50 MG tablet   Oral   Take 1 tablet (50 mg total) by mouth every 6 (six) hours as needed for moderate pain.   12 tablet   0     Allergies Pepto-bismol  No family history on file.  Social History History  Substance Use Topics  . Smoking status: Heavy Tobacco Smoker -- 2.00 packs/day    Types: Cigarettes  . Smokeless tobacco: Never Used  . Alcohol Use: No    Review of Systems Constitutional: No fever/chills Eyes: No visual changes. ENT: No sore throat. Cardiovascular: Denies chest pain. Respiratory: Denies shortness of breath. Gastrointestinal: No  abdominal pain.  No nausea, no vomiting.  No diarrhea.  No constipation. Genitourinary: Negative for dysuria. Musculoskeletal: Negative for back pain. Skin: Redness and swelling right lower leg. Neurological: Negative for headaches, focal weakness or numbness. 10-point ROS otherwise negative.  ____________________________________________   PHYSICAL EXAM:  VITAL SIGNS: ED Triage Vitals  Enc Vitals Group     BP 08/20/14 1343 137/99 mmHg     Pulse Rate 08/20/14 1343 109     Resp 08/20/14 1343 18     Temp 08/20/14 1343 98 F (36.7 C)     Temp Source 08/20/14 1343 Oral     SpO2 08/20/14 1343 98 %     Weight 08/20/14 1343 250 lb (113.399 kg)     Height 08/20/14 1343 6\' 1"  (1.854 m)     Head Cir --      Peak Flow --      Pain Score 08/20/14 1344 6     Pain Loc --      Pain Edu? --      Excl. in [place]? --     Constitutional: Alert and oriented. Well appearing and in no acute distress. Eyes: Conjunctivae are normal. PERRL. EOMI. Head: Atraumatic. Nose: No congestion/rhinnorhea. Mouth/Throat: Mucous membranes are moist.  Oropharynx non-erythematous. Neck: No stridor. Neck is supple and nontender. Hematological/Lymphatic/Immunilogical: No cervical lymphadenopathy. Cardiovascular: Normal rate, regular rhythm. Grossly  normal heart sounds.  Good peripheral circulation. Respiratory: Normal respiratory effort.  No retractions. Lungs CTAB. Gastrointestinal: Soft and nontender. No distention. No abdominal bruits. No CVA tenderness. Musculoskeletal: No lower extremity tenderness nor edema.  No joint effusions. Neurologic:  Normal speech and language. No gross focal neurologic deficits are appreciated. Speech is normal. No gait instability. Skin:  Small puncture wound just the right leg. Mild edema and erythema in areas tender to palpation. There is no discharge from the wound site. Psychiatric: Mood and affect are normal. Speech and behavior are  normal.  ____________________________________________   LABS (all labs ordered are listed, but only abnormal results are displayed)  Labs Reviewed - No data to display ____________________________________________  EKG   ____________________________________________  RADIOLOGY   ____________________________________________   PROCEDURES  Procedure(s) performed: None  Critical Care performed: No  ____________________________________________   INITIAL IMPRESSION / ASSESSMENT AND PLAN / ED COURSE  Pertinent labs & imaging results that were available during my care of the patient were reviewed by me and considered in my medical decision making (see chart for details).  Skin infection secondary to insect bite. ____________________________________________   FINAL CLINICAL IMPRESSION(S) / ED DIAGNOSES  Final diagnoses:  Insect bite      Sable Feil, PA-C 08/20/14 1459

## 2014-08-20 NOTE — ED Notes (Signed)
Pt reports that he was bit by a spider or a tick, his right ankle is hurting up to his right knee. It is red and swollen.

## 2014-09-29 ENCOUNTER — Ambulatory Visit (INDEPENDENT_AMBULATORY_CARE_PROVIDER_SITE_OTHER): Payer: No Typology Code available for payment source | Admitting: Podiatry

## 2014-09-29 ENCOUNTER — Encounter: Payer: Self-pay | Admitting: Podiatry

## 2014-09-29 ENCOUNTER — Ambulatory Visit (INDEPENDENT_AMBULATORY_CARE_PROVIDER_SITE_OTHER): Payer: No Typology Code available for payment source

## 2014-09-29 VITALS — BP 133/84 | HR 87 | Resp 16

## 2014-09-29 DIAGNOSIS — M109 Gout, unspecified: Secondary | ICD-10-CM

## 2014-09-29 DIAGNOSIS — M7751 Other enthesopathy of right foot: Secondary | ICD-10-CM

## 2014-09-29 DIAGNOSIS — M778 Other enthesopathies, not elsewhere classified: Secondary | ICD-10-CM

## 2014-09-29 DIAGNOSIS — M779 Enthesopathy, unspecified: Secondary | ICD-10-CM

## 2014-09-29 MED ORDER — MELOXICAM 15 MG PO TABS: 15.0000 mg | ORAL_TABLET | Freq: Every day | ORAL | Status: DC

## 2014-09-29 MED ORDER — METHYLPREDNISOLONE 4 MG PO TBPK: ORAL_TABLET | ORAL | Status: DC

## 2014-09-29 NOTE — Progress Notes (Signed)
He presents today with several weeks pain to his right foot. He states that I think he might have gout as he refers to the first metatarsophalangeal joint of the right foot. He denies redness swelling pain. Denies any changes in his past medical history medications or allergies.  Objective: Vital signs are stable alert and oriented 3. Pulses are strongly palpable. Neurologic sensory is intact bilateral. Deep tendon reflexes are intact bilateral muscle strength +5 over 5 dorsiflexion plantar flexors and inverters everters MUSCULATURE IS INTACT. HE HAS PAIN ON RANGE OF MOTION AND FLUID IS PALPABLE WITHIN THE FIRST METATARSOPHALANGEAL JOINT. RADIOGRAPHS DEMONSTRATE NO OSSEOUS ABNORMALITIES.  ASSESSMENT: CAPSULITIS FIRST METATARSOPHALANGEAL JOINT RIGHT.  PLAN: I OFFERED HIM AN INJECTION TO THE FIRST METATARSOPHALANGEAL JOINT WHICH SHE DECLINED. I WROTE A PRESCRIPTION FOR A MEDROL DOSEPAK TO BE FOLLOWED BY MELOXICAM.

## 2014-10-27 ENCOUNTER — Ambulatory Visit: Payer: No Typology Code available for payment source | Admitting: Podiatry

## 2014-12-31 ENCOUNTER — Encounter: Payer: Self-pay | Admitting: Urgent Care

## 2014-12-31 ENCOUNTER — Emergency Department
Admission: EM | Admit: 2014-12-31 | Discharge: 2014-12-31 | Disposition: A | Discharge status: Home / Self Care | Payer: No Typology Code available for payment source | Attending: Emergency Medicine | Admitting: Emergency Medicine

## 2014-12-31 DIAGNOSIS — Y9289 Other specified places as the place of occurrence of the external cause: Secondary | ICD-10-CM | POA: Insufficient documentation

## 2014-12-31 DIAGNOSIS — Y998 Other external cause status: Secondary | ICD-10-CM | POA: Insufficient documentation

## 2014-12-31 DIAGNOSIS — Y9389 Activity, other specified: Secondary | ICD-10-CM | POA: Insufficient documentation

## 2014-12-31 DIAGNOSIS — Z720 Tobacco use: Secondary | ICD-10-CM | POA: Insufficient documentation

## 2014-12-31 DIAGNOSIS — R21 Rash and other nonspecific skin eruption: Secondary | ICD-10-CM

## 2014-12-31 DIAGNOSIS — S30861A Insect bite (nonvenomous) of abdominal wall, initial encounter: Secondary | ICD-10-CM | POA: Insufficient documentation

## 2014-12-31 DIAGNOSIS — L509 Urticaria, unspecified: Secondary | ICD-10-CM | POA: Insufficient documentation

## 2014-12-31 DIAGNOSIS — W57XXXA Bitten or stung by nonvenomous insect and other nonvenomous arthropods, initial encounter: Secondary | ICD-10-CM | POA: Insufficient documentation

## 2014-12-31 DIAGNOSIS — S60561A Insect bite (nonvenomous) of right hand, initial encounter: Secondary | ICD-10-CM | POA: Insufficient documentation

## 2014-12-31 DIAGNOSIS — S60562A Insect bite (nonvenomous) of left hand, initial encounter: Secondary | ICD-10-CM | POA: Insufficient documentation

## 2014-12-31 MED ORDER — METHYLPREDNISOLONE SODIUM SUCC 125 MG IJ SOLR
INTRAMUSCULAR | Status: AC
  Administered 2014-12-31: 125 mg via INTRAMUSCULAR
  Filled 2014-12-31: qty 2

## 2014-12-31 MED ORDER — METHYLPREDNISOLONE 4 MG PO TBPK: ORAL_TABLET | ORAL | Status: DC

## 2014-12-31 MED ORDER — DIPHENHYDRAMINE HCL 25 MG PO CAPS
50.0000 mg | ORAL_CAPSULE | Freq: Once | ORAL | Status: AC
  Administered 2014-12-31: 50 mg via ORAL

## 2014-12-31 MED ORDER — DIPHENHYDRAMINE HCL 25 MG PO CAPS: 25.0000 mg | ORAL_CAPSULE | ORAL | Status: DC | PRN

## 2014-12-31 MED ORDER — METHYLPREDNISOLONE SODIUM SUCC 125 MG IJ SOLR
125.0000 mg | Freq: Once | INTRAMUSCULAR | Status: AC
  Administered 2014-12-31: 125 mg via INTRAMUSCULAR

## 2014-12-31 MED ORDER — DIPHENHYDRAMINE HCL 25 MG PO CAPS
ORAL_CAPSULE | ORAL | Status: AC
Start: 2014-12-31 — End: 2014-12-31
  Administered 2014-12-31: 50 mg via ORAL
  Filled 2014-12-31: qty 2

## 2014-12-31 NOTE — ED Notes (Addendum)
Patient presents with hives to his entire body; unknown etiology. Reports that symptoms began without unknown cause at 1300. Denies new foods, meds, soaps, etc. (+) itching. No respiratory involved noted or reported in triage.

## 2014-12-31 NOTE — ED Provider Notes (Signed)
[facility] Emergency Department Provider Note     Time seen: ----------------------------------------- 8:49 PM on 12/31/2014 -----------------------------------------    I have reviewed the triage vital signs and the nursing notes.   HISTORY  Chief Complaint Urticaria    HPI Brad Mcfarland is a 34 y.o. male who presents ER with hives to his entire body, unknown etiology. Patient reports symptoms began about 1:00 today. 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 He has not had a history of this before, denies any contacts or exposures to any allergens.   History reviewed. No pertinent past medical history.  There are no active problems to display for this patient.   Past Surgical History  Procedure Laterality Date  . Appendectomy    . Wisdom tooth extraction      Allergies Pepto-bismol  Social History Social History  Substance Use Topics  . Smoking status: Heavy Tobacco Smoker -- 2.00 packs/day    Types: Cigarettes  . Smokeless tobacco: Never Used  . Alcohol Use: No    Review of Systems Constitutional: Negative for fever. Skin: Positive for rash and itching  ____________________________________________   PHYSICAL EXAM:  VITAL SIGNS: ED Triage Vitals  Enc Vitals Group     BP 12/31/14 1951 133/88 mmHg     Pulse Rate 12/31/14 1951 79     Resp 12/31/14 1951 18     Temp 12/31/14 1951 98.5 F (36.9 C)     Temp Source 12/31/14 1951 Oral     SpO2 12/31/14 1951 99 %     Weight 12/31/14 1951 250 lb (113.399 kg)     Height 12/31/14 1951 6\' 2"  (1.88 m)     Head Cir --      Peak Flow --      Pain Score 12/31/14 1951 5     Pain Loc --      Pain Edu? --      Excl. in [place]? --     Constitutional: Alert and oriented. Well appearing and in no distress. Skin: Patient with multiple grouped bites over his trunk and upper extremities. These appear to  be insect bites of indeterminate etiology. Possible bed bugs   ____________________________________________  ED COURSE:  Pertinent labs & imaging results that were available during my care of the patient were reviewed by me and considered in my medical decision making (see chart for details). Patient received IM Solu-Medrol, oral Benadryl. ____________________________________________     FINAL ASSESSMENT AND PLAN  Rash, insect bites  Plan: Symptoms are likely bed bug related. He'll continue on steroid taper, Benadryl as needed for itching. He stable for outpatient follow-up  Earleen Newport, MD   Earleen Newport, MD 12/31/14 2051

## 2014-12-31 NOTE — ED Notes (Signed)
Patient with complaint of hives all over. Denies known exposure or contacts that would have caused the reaction. No airway involvement.

## 2014-12-31 NOTE — Discharge Instructions (Signed)
Bedbugs Bedbugs are tiny bugs that live in and around beds. During the day, they hide in mattresses and other places near beds. They come out at night and bite people lying in bed. They need blood to live and grow. Bedbugs can be found in beds anywhere. Usually, they are found in places where many people come and go (hotels, shelters, hospitals). It does not matter whether the place is dirty or clean. Getting bitten by bedbugs rarely causes a medical problem. The biggest problem can be getting rid of them. This often takes the work of a Financial risk analyst. CAUSES  Less use of pesticides. Bedbugs were common before the 1950s. Then, strong pesticides such as DDT nearly wiped them out. Today, these pesticides are not used because they harm the environment and can cause health problems.  More travel. Besides mattresses, bedbugs can also live in clothing and luggage. They can come along as people travel from place to place. Bedbugs are more common in certain parts of the world. When people travel to those areas, the bugs can come home with them.  Presence of birds and bats. Bedbugs often infest birds and bats. If you have these animals in or near your home, bedbugs may infest your house, too. SYMPTOMS It does not hurt to be bitten by a bedbug. You will probably not wake up when you are bitten. Bedbugs usually bite areas of the skin that are not covered. Symptoms may show when you wake up, or they may take a day or more to show up. Symptoms may include:  Small red bumps on the skin. These might be lined up in a row or clustered in a group.  A darker red dot in the middle of red bumps.  Blisters on the skin. There may be swelling and very bad itching. These may be signs of an allergic reaction. This does not happen often. DIAGNOSIS Bedbug bites might look and feel like other types of insect bites. The bugs do not stay on the body like ticks or lice. They bite, drop off, and crawl away to hide. Your  caregiver will probably:  Ask about your symptoms.  Ask about your recent activities and travel.  Check your skin for bedbug bites.  Ask you to check at home for signs of bedbugs. You should look for:  Spots or stains on the bed or nearby. This could be from bedbugs that were crushed or from their eggs or waste.  Bedbugs themselves. They are reddish-brown, oval, and flat. They do not fly. They are about the size of an apple seed.  Places to look for bedbugs include:  Beds. Check mattresses, headboards, box springs, and bed frames.  On drapes and curtains near the bed.  Under carpeting in the bedroom.  Behind electrical outlets.  Behind any wallpaper that is peeling.  Inside luggage. TREATMENT Most bedbug bites do not need treatment. They usually go away on their own in a few days. The bites are not dangerous. However, treatment may be needed if you have scratched so much that your skin has become infected. You may also need treatment if you are allergic to bedbug bites. Treatment options include:  A drug that stops swelling and itching (corticosteroid). Usually, a cream is rubbed on the skin. If you have a bad rash, you may be given a corticosteroid pill.  Oral antihistamines. These are pills to help control itching.  Antibiotic medicines. An antibiotic may be prescribed for infected skin. HOME CARE INSTRUCTIONS  Take any medicine prescribed by your caregiver for your bites. Follow the directions carefully.  Consider wearing pajamas with long sleeves and pant legs.  Your bedroom may need to be treated. A pest control expert should make sure the bedbugs are gone. You may need to throw away mattresses or luggage. Ask the pest control expert what you can do to keep the bedbugs from coming back. Common suggestions include:  Putting a plastic cover over your mattress.  Washing and drying your clothes and bedding in hot water and a hot dryer. The temperature should be hotter  than 120 F (48.9 C). Bedbugs are killed by high temperatures.  Vacuuming carefully all around your bed. Vacuum in all cracks and crevices where the bugs might hide. Do this often.  Carefully checking all used furniture, bedding, or clothes that you bring into your house.  Eliminating bird nests and bat roosts.  If you get bedbug bites when traveling, check all your possessions carefully before bringing them into your house. If you find any bugs on clothes or in your luggage, consider throwing those items away. SEEK MEDICAL CARE IF:  You have red bug bites that keep coming back.  You have red bug bites that itch badly.  You have bug bites that cause a skin rash.  You have scratch marks that are red and sore. SEEK IMMEDIATE MEDICAL CARE IF: You have a fever. Document Released: 04/23/2010 Document Revised: 06/13/2011 Document Reviewed: 04/23/2010 [facility] Patient Information 2015 [place], [place]. This information is not intended to replace advice given to you by your health care provider. Make sure you discuss any questions you have with your health care provider.  Rash A rash is a change in the color or texture of your skin. There are many different types of rashes. You may have other problems that accompany your rash. CAUSES   Infections.  Allergic reactions. This can include allergies to pets or foods.  Certain medicines.  Exposure to certain chemicals, soaps, or cosmetics.  Heat.  Exposure to poisonous plants.  Tumors, both cancerous and noncancerous. SYMPTOMS   Redness.  Scaly skin.  Itchy skin.  Dry or cracked skin.  Bumps.  Blisters.  Pain. DIAGNOSIS  Your caregiver may do a physical exam to determine what type of rash you have. A skin sample (biopsy) may be taken and examined under a microscope. TREATMENT  Treatment depends on the type of rash you have. Your caregiver may prescribe certain medicines. For serious conditions, you may need to see a skin  doctor (dermatologist). HOME CARE INSTRUCTIONS   Avoid the substance that caused your rash.  Do not scratch your rash. This can cause infection.  You may take cool baths to help stop itching.  Only take over-the-counter or prescription medicines as directed by your caregiver.  Keep all follow-up appointments as directed by your caregiver. SEEK IMMEDIATE MEDICAL CARE IF:  You have increasing pain, swelling, or redness.  You have a fever.  You have new or severe symptoms.  You have body aches, diarrhea, or vomiting.  Your rash is not better after 3 days. MAKE SURE YOU:  Understand these instructions.  Will watch your condition.  Will get help right away if you are not doing well or get worse. Document Released: 03/11/2002 Document Revised: 06/13/2011 Document Reviewed: 01/03/2011 [facility] Patient Information 2015 [place], [place]. This information is not intended to replace advice given to you by your health care provider. Make sure you discuss any questions you have with your health  care provider.  

## 2015-09-22 ENCOUNTER — Emergency Department
Admission: EM | Admit: 2015-09-22 | Discharge: 2015-09-22 | Disposition: A | Discharge status: Home / Self Care | Payer: Self-pay | Attending: Emergency Medicine | Admitting: Emergency Medicine

## 2015-09-22 ENCOUNTER — Other Ambulatory Visit: Payer: Self-pay

## 2015-09-22 ENCOUNTER — Encounter: Payer: Self-pay | Admitting: Emergency Medicine

## 2015-09-22 ENCOUNTER — Emergency Department: Payer: Self-pay

## 2015-09-22 DIAGNOSIS — R42 Dizziness and giddiness: Secondary | ICD-10-CM | POA: Insufficient documentation

## 2015-09-22 DIAGNOSIS — F17210 Nicotine dependence, cigarettes, uncomplicated: Secondary | ICD-10-CM | POA: Insufficient documentation

## 2015-09-22 DIAGNOSIS — H6121 Impacted cerumen, right ear: Secondary | ICD-10-CM | POA: Insufficient documentation

## 2015-09-22 LAB — CBC
HEMATOCRIT: 44.6 % (ref 40.0–52.0)
HEMOGLOBIN: 15.7 g/dL (ref 13.0–18.0)
MCH: 29.6 pg (ref 26.0–34.0)
MCHC: 35.2 g/dL (ref 32.0–36.0)
MCV: 84.2 fL (ref 80.0–100.0)
Platelets: 182 10*3/uL (ref 150–440)
RBC: 5.29 MIL/uL (ref 4.40–5.90)
RDW: 13.1 % (ref 11.5–14.5)
WBC: 10.8 10*3/uL — ABNORMAL HIGH (ref 3.8–10.6)

## 2015-09-22 LAB — BASIC METABOLIC PANEL
ANION GAP: 7 (ref 5–15)
BUN: 14 mg/dL (ref 6–20)
CALCIUM: 9 mg/dL (ref 8.9–10.3)
CO2: 21 mmol/L — AB (ref 22–32)
Chloride: 109 mmol/L (ref 101–111)
Creatinine, Ser: 1.1 mg/dL (ref 0.61–1.24)
GFR calc Af Amer: >60 mL/min (ref >60)
GFR calc non Af Amer: >60 mL/min (ref >60)
GLUCOSE: 104 mg/dL — AB (ref 65–99)
POTASSIUM: 3.8 mmol/L (ref 3.5–5.1)
Sodium: 137 mmol/L (ref 135–145)

## 2015-09-22 LAB — TROPONIN I

## 2015-09-22 MED ORDER — MECLIZINE HCL 25 MG PO TABS
25.0000 mg | ORAL_TABLET | Freq: Once | ORAL | Status: AC
  Administered 2015-09-22: 25 mg via ORAL
  Filled 2015-09-22: qty 1

## 2015-09-22 MED ORDER — METOCLOPRAMIDE HCL 5 MG/ML IJ SOLN
10.0000 mg | Freq: Once | INTRAMUSCULAR | Status: AC
  Administered 2015-09-22: 10 mg via INTRAVENOUS
  Filled 2015-09-22: qty 2

## 2015-09-22 MED ORDER — DIPHENHYDRAMINE HCL 50 MG/ML IJ SOLN
25.0000 mg | Freq: Once | INTRAMUSCULAR | Status: AC
  Administered 2015-09-22: 25 mg via INTRAVENOUS
  Filled 2015-09-22: qty 1

## 2015-09-22 MED ORDER — SODIUM CHLORIDE 0.9 % IV BOLUS (SEPSIS)
1000.0000 mL | Freq: Once | INTRAVENOUS | Status: AC
  Administered 2015-09-22: 1000 mL via INTRAVENOUS

## 2015-09-22 MED ORDER — MECLIZINE HCL 25 MG PO TABS: 25.0000 mg | ORAL_TABLET | Freq: Three times a day (TID) | ORAL | Status: DC | PRN

## 2015-09-22 MED ORDER — CARBAMIDE PEROXIDE 6.5 % OT SOLN: 5.0000 [drp] | Freq: Two times a day (BID) | OTIC | Status: DC

## 2015-09-22 NOTE — ED Notes (Signed)
Pt to ed with c/o chest pain that has been intermittent for the last few weeks,  Reports this am started with dizziness for the last hour.

## 2015-09-22 NOTE — Discharge Instructions (Signed)
Take meclizine as needed for dizziness.   Use debrox (peroxide) drops to the right ear twice daily as needed.   See your doctor.   Return to ER if you have worse dizziness, vertigo, vomiting, weakness.

## 2015-09-22 NOTE — ED Notes (Signed)
To CT Scan via stretcher. AAOx3.  Skin warm and dry. NAD. Continue to monitor.

## 2015-09-22 NOTE — ED Provider Notes (Signed)
CSN: [id number]     Arrival date & time 09/22/15  1030 History   First MD Initiated Contact with Patient 09/22/15 1106     Chief Complaint  Patient presents with  . Chest Pain     (Consider location/radiation/quality/duration/timing/severity/associated sxs/prior Treatment) The history is provided by the patient.  PIERSEN HOGENSON is a 35 y.o. male history appendectomy here presenting with chest pain, dizziness. Intermittent chest pain for the last several weeks that is not getting worse. 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 He had sudden onset of dizziness over the last hour. Felt like the room was spinning and he feels nauseated. Denies any near syncope or chest pain. States that he is otherwise healthy and no history of stroke or heart attacks.   History reviewed. No pertinent past medical history. Past Surgical History  Procedure Laterality Date  . Appendectomy    . Wisdom tooth extraction     History reviewed. No pertinent family history. Social History  Substance Use Topics  . Smoking status: Heavy Tobacco Smoker -- 2.00 packs/day    Types: Cigarettes  . Smokeless tobacco: Never Used  . Alcohol Use: No    Review of Systems  Cardiovascular: Positive for chest pain.  Neurological: Positive for dizziness.  All other systems reviewed and are negative.     Allergies  Pepto-bismol  Home Medications   Prior to Admission medications   Not on File   BP 103/64 mmHg  Pulse 80  Resp 16 Physical Exam  Constitutional: He is oriented to person, place, and time.  Uncomfortable   HENT:  Head: Normocephalic.  Mouth/Throat: Oropharynx is clear and moist.  R cerumen impaction   Eyes: Pupils are equal, round, and reactive to light.  ? R horizontal nystagmus, no rotatory or vertical nystagmus   Neck: Normal range of motion. Neck supple.  Cardiovascular: Normal rate, regular rhythm and normal heart sounds.   Pulmonary/Chest: Effort normal and breath sounds normal. No  respiratory distress. He has no wheezes. He has no rales.  Abdominal: Soft. Bowel sounds are normal. He exhibits no distension. There is no tenderness. There is no rebound.  Musculoskeletal: Normal range of motion. He exhibits no edema or tenderness.  Neurological: He is alert and oriented to person, place, and time.  CN  2-12 intact. Nl strength throughout. Nl finger to nose. Negative rhomberg. Nl gait   Skin: Skin is warm and dry.  Psychiatric: He has a normal mood and affect. His behavior is normal. Judgment and thought content normal.  Nursing note and vitals reviewed.   ED Course  .Ear Cerumen Removal Date/Time: 09/22/2015 2:02 PM Performed by: Wandra Arthurs Authorized by: Wandra Arthurs Consent: Verbal consent obtained. Risks and benefits: risks, benefits and alternatives were discussed Consent given by: patient Patient understanding: patient states understanding of the procedure being performed Patient consent: the patient's understanding of the procedure matches consent given Procedure consent: procedure consent matches procedure scheduled Relevant documents: relevant documents present and verified Test results: test results available and properly labeled Patient identity confirmed: verbally with patient Local anesthetic: none Ceruminolytics applied: Ceruminolytics applied prior to the procedure. Location details: right ear Procedure type: curette and irrigation Patient sedated: no Patient tolerance: Patient tolerated the procedure well with no immediate complications   (including critical care time)   Labs Review Labs Reviewed  BASIC METABOLIC PANEL - Abnormal; Notable for the following:    CO2 21 (*)    Glucose, Bld 104 (*)  All other components within normal limits  CBC - Abnormal; Notable for the following:    WBC 10.8 (*)    All other components within normal limits  TROPONIN I    Imaging Review Dg Chest 2 View  09/22/2015  CLINICAL DATA:  Chest pain for  several weeks. Dizziness and shortness of breath beginning today. EXAM: CHEST  2 VIEW COMPARISON:  01/17/2014 FINDINGS: The heart size and mediastinal contours are within normal limits. Both lungs are clear. The visualized skeletal structures are unremarkable. IMPRESSION: No active cardiopulmonary disease. Electronically Signed   By: Earle Gell M.D.   On: 09/22/2015 11:23   Ct Head Wo Contrast  09/22/2015  CLINICAL DATA:  Acute onset dizziness today.  Vertigo. EXAM: CT HEAD WITHOUT CONTRAST TECHNIQUE: Contiguous axial images were obtained from the base of the skull through the vertex without intravenous contrast. COMPARISON:  05/09/2014 FINDINGS: No evidence of intracranial hemorrhage, brain edema, or other signs of acute infarction. No evidence of intracranial mass lesion or mass effect. No abnormal extraaxial fluid collections identified. Ventricles are normal in size. No skull abnormality identified. Previously seen partial opacification of left maxillary sinus has resolved since previous study. IMPRESSION: Negative noncontrast head CT. Electronically Signed   By: Earle Gell M.D.   On: 09/22/2015 12:04   I have personally reviewed and evaluated these images and lab results as part of my medical decision-making.   EKG Interpretation None       ED ECG REPORT I, Morgin Halls, the attending physician, personally viewed and interpreted this ECG.   Date: 09/22/2015  EKG Time: 10:37  Rate: 86  Rhythm: normal EKG, normal sinus rhythm  Axis: normal  Intervals:none  ST&T Change: nonspecific    MDM   Final diagnoses:  None   KAZIMIR DEVISSER is a 35 y.o. male here with chest pain, dizziness. Chest pain for several weeks and I doubt ACS or PE or dissection. Dizziness and vertigo acute onset for the last hour. Does have cerumen impaction which can contribute to it. Will get CT head and give meclizine and IVF and reassess. Low suspicion for posterior stroke.   1:47 PM CT head, labs, CXR clear.  Felt better with meclizine. R ear irrigated by me and large amount of cerumen came out. Able to ambulate well. Likely peripheral vertigo from cerumen impaction. Will dc home with meclizine prn, debrox drops.      Wandra Arthurs, MD 09/22/15 [phone]

## 2015-09-22 NOTE — ED Notes (Signed)
AAOx3.  Skin warm and dry.  Denies c/o chest pain but c/o feeling dizzy.  Dizziness worsens with movement.  Denies headache.

## 2015-09-22 NOTE — ED Notes (Signed)
AAOx3.  Skin warm and dry.  NAD 

## 2015-12-28 ENCOUNTER — Emergency Department: Payer: 59

## 2015-12-28 ENCOUNTER — Encounter: Payer: Self-pay | Admitting: Emergency Medicine

## 2015-12-28 ENCOUNTER — Emergency Department
Admission: EM | Admit: 2015-12-28 | Discharge: 2015-12-28 | Disposition: A | Discharge status: Home / Self Care | Payer: 59 | Attending: Emergency Medicine | Admitting: Emergency Medicine

## 2015-12-28 DIAGNOSIS — F17210 Nicotine dependence, cigarettes, uncomplicated: Secondary | ICD-10-CM | POA: Diagnosis not present

## 2015-12-28 DIAGNOSIS — R1033 Periumbilical pain: Secondary | ICD-10-CM | POA: Diagnosis present

## 2015-12-28 DIAGNOSIS — K5732 Diverticulitis of large intestine without perforation or abscess without bleeding: Secondary | ICD-10-CM | POA: Diagnosis not present

## 2015-12-28 DIAGNOSIS — R112 Nausea with vomiting, unspecified: Secondary | ICD-10-CM

## 2015-12-28 DIAGNOSIS — R197 Diarrhea, unspecified: Secondary | ICD-10-CM

## 2015-12-28 DIAGNOSIS — K5792 Diverticulitis of intestine, part unspecified, without perforation or abscess without bleeding: Secondary | ICD-10-CM

## 2015-12-28 LAB — COMPREHENSIVE METABOLIC PANEL
ALT: 25 U/L (ref 17–63)
AST: 22 U/L (ref 15–41)
Albumin: 4.3 g/dL (ref 3.5–5.0)
Alkaline Phosphatase: 61 U/L (ref 38–126)
Anion gap: 9 (ref 5–15)
BUN: 13 mg/dL (ref 6–20)
CHLORIDE: 109 mmol/L (ref 101–111)
CO2: 22 mmol/L (ref 22–32)
CREATININE: 1.15 mg/dL (ref 0.61–1.24)
Calcium: 9.3 mg/dL (ref 8.9–10.3)
Glucose, Bld: 94 mg/dL (ref 65–99)
POTASSIUM: 4 mmol/L (ref 3.5–5.1)
Sodium: 140 mmol/L (ref 135–145)
Total Bilirubin: 0.9 mg/dL (ref 0.3–1.2)
Total Protein: 7.5 g/dL (ref 6.5–8.1)

## 2015-12-28 LAB — URINALYSIS COMPLETE WITH MICROSCOPIC (ARMC ONLY)
BILIRUBIN URINE: NEGATIVE
Bacteria, UA: NONE SEEN
Glucose, UA: NEGATIVE mg/dL
HGB URINE DIPSTICK: NEGATIVE
KETONES UR: NEGATIVE mg/dL
LEUKOCYTES UA: NEGATIVE
Nitrite: NEGATIVE
PH: 6 (ref 5.0–8.0)
Protein, ur: NEGATIVE mg/dL
RBC / HPF: NONE SEEN RBC/hpf (ref 0–5)
Specific Gravity, Urine: 1.02 (ref 1.005–1.030)
Squamous Epithelial / LPF: NONE SEEN

## 2015-12-28 LAB — CBC WITH DIFFERENTIAL/PLATELET
Basophils Absolute: 0.1 10*3/uL (ref 0–0.1)
Basophils Relative: 1 %
EOS PCT: 2 %
Eosinophils Absolute: 0.2 10*3/uL (ref 0–0.7)
HCT: 42.4 % (ref 40.0–52.0)
Hemoglobin: 15.2 g/dL (ref 13.0–18.0)
LYMPHS ABS: 2.8 10*3/uL (ref 1.0–3.6)
LYMPHS PCT: 25 %
MCH: 29.9 pg (ref 26.0–34.0)
MCHC: 35.8 g/dL (ref 32.0–36.0)
MCV: 83.5 fL (ref 80.0–100.0)
MONO ABS: 0.6 10*3/uL (ref 0.2–1.0)
Monocytes Relative: 5 %
Neutro Abs: 7.5 10*3/uL — ABNORMAL HIGH (ref 1.4–6.5)
Neutrophils Relative %: 67 %
PLATELETS: 185 10*3/uL (ref 150–440)
RBC: 5.07 MIL/uL (ref 4.40–5.90)
RDW: 13.2 % (ref 11.5–14.5)
WBC: 11.1 10*3/uL — ABNORMAL HIGH (ref 3.8–10.6)

## 2015-12-28 LAB — LIPASE, BLOOD: LIPASE: 22 U/L (ref 11–51)

## 2015-12-28 MED ORDER — IOPAMIDOL (ISOVUE-300) INJECTION 61%
100.0000 mL | Freq: Once | INTRAVENOUS | Status: AC | PRN
  Administered 2015-12-28: 100 mL via INTRAVENOUS

## 2015-12-28 MED ORDER — IOPAMIDOL (ISOVUE-300) INJECTION 61%
30.0000 mL | Freq: Once | INTRAVENOUS | Status: AC
  Administered 2015-12-28: 30 mL via ORAL

## 2015-12-28 MED ORDER — MORPHINE SULFATE (PF) 4 MG/ML IV SOLN
4.0000 mg | Freq: Once | INTRAVENOUS | Status: AC
Start: 2015-12-28 — End: 2015-12-28
  Administered 2015-12-28: 4 mg via INTRAVENOUS
  Filled 2015-12-28: qty 1

## 2015-12-28 MED ORDER — METRONIDAZOLE 500 MG PO TABS
500.0000 mg | ORAL_TABLET | Freq: Once | ORAL | Status: AC
  Administered 2015-12-28: 500 mg via ORAL
  Filled 2015-12-28: qty 1

## 2015-12-28 MED ORDER — MORPHINE SULFATE (PF) 4 MG/ML IV SOLN
INTRAVENOUS | Status: AC
  Administered 2015-12-28: 4 mg
  Filled 2015-12-28: qty 1

## 2015-12-28 MED ORDER — CIPROFLOXACIN HCL 500 MG PO TABS: 500.0000 mg | ORAL_TABLET | Freq: Two times a day (BID) | ORAL | 0 refills | Status: AC

## 2015-12-28 MED ORDER — METRONIDAZOLE 500 MG PO TABS: 500.0000 mg | ORAL_TABLET | Freq: Three times a day (TID) | ORAL | 0 refills | Status: AC

## 2015-12-28 MED ORDER — CIPROFLOXACIN HCL 500 MG PO TABS
500.0000 mg | ORAL_TABLET | Freq: Once | ORAL | Status: AC
  Administered 2015-12-28: 500 mg via ORAL
  Filled 2015-12-28: qty 1

## 2015-12-28 MED ORDER — ONDANSETRON HCL 4 MG/2ML IJ SOLN
4.0000 mg | Freq: Once | INTRAMUSCULAR | Status: AC
  Administered 2015-12-28: 4 mg via INTRAVENOUS
  Filled 2015-12-28: qty 2

## 2015-12-28 MED ORDER — SODIUM CHLORIDE 0.9 % IV BOLUS (SEPSIS)
1000.0000 mL | Freq: Once | INTRAVENOUS | Status: AC
  Administered 2015-12-28: 1000 mL via INTRAVENOUS

## 2015-12-28 MED ORDER — OXYCODONE-ACETAMINOPHEN 5-325 MG PO TABS: 1.0000 | ORAL_TABLET | Freq: Four times a day (QID) | ORAL | 0 refills | Status: DC | PRN

## 2015-12-28 NOTE — ED Notes (Signed)
Pt rerturns from CT scan at this time.

## 2015-12-28 NOTE — ED Provider Notes (Signed)
[facility] Emergency Department Provider Note   ____________________________________________   First MD Initiated Contact with Patient 12/28/15 [phone]     (approximate)  I have reviewed the triage vital signs and the nursing notes.   HISTORY  Chief Complaint Abdominal Pain   HPI Brad Mcfarland is a 34 y.o. male with a history of IBS was presenting with periumbilical abdominal pain as well as nausea, vomiting and diarrhea. He says the pain started this morning and is sharp like a stabbing pain in his knee that of 10. It does not radiate. He says that he is also had several episodes of vomiting. He says that he feels a lump just above his bellybutton. Said that he did a lot of heavy lifting and pulling this weekend that a tractor pull. Denies any known sick contacts. Had 3 bowel movements this morning already.   History reviewed. No pertinent past medical history.  There are no active problems to display for this patient.   Past Surgical History:  Procedure Laterality Date  . APPENDECTOMY    . WISDOM TOOTH EXTRACTION      Prior to Admission medications   Medication Sig Start Date End Date Taking? Authorizing Provider  carbamide peroxide (DEBROX) 6.5 % otic solution Place 5 drops into the right ear 2 (two) times daily. Patient not taking: Reported on 12/28/2015 09/22/15 09/21/16  Drenda Freeze, MD  meclizine (ANTIVERT) 25 MG tablet Take 1 tablet (25 mg total) by mouth 3 (three) times daily as needed for dizziness. Patient not taking: Reported on 12/28/2015 09/22/15   Drenda Freeze, MD    Allergies Pepto-bismol [bismuth]  No family history on file.  Social History Social History  Substance Use Topics  . Smoking status: Heavy Tobacco Smoker    Packs/day: 2.00    Types: Cigarettes  . Smokeless tobacco: Never Used  . Alcohol use No    Review of Systems Constitutional: No fever/chills Eyes: No visual changes. ENT: No sore  throat. Cardiovascular: Denies chest pain. Respiratory: Denies shortness of breath. Gastrointestinal:  No constipation. Genitourinary: Negative for dysuria. Musculoskeletal: Negative for back pain. Skin: Negative for rash. Neurological: Negative for headaches, focal weakness or numbness.  10-point ROS otherwise negative.  ____________________________________________   PHYSICAL EXAM:  VITAL SIGNS: ED Triage Vitals  Enc Vitals Group     BP 12/28/15 0940 133/90     Pulse Rate 12/28/15 0940 95     Resp 12/28/15 0940 20     Temp 12/28/15 0940 98 F (36.7 C)     Temp Source 12/28/15 0940 Oral     SpO2 12/28/15 0940 100 %     Weight 12/28/15 0941 250 lb (113.4 kg)     Height 12/28/15 0941 6\' 2"  (1.88 m)     Head Circumference --      Peak Flow --      Pain Score 12/28/15 0941 8     Pain Loc --      Pain Edu? --      Excl. in [place]? --     Constitutional: Alert and oriented. Appears uncomfortable Eyes: Conjunctivae are normal. PERRL. EOMI. Head: Atraumatic. Nose: No congestion/rhinnorhea. Mouth/Throat: Mucous membranes are moist.  Neck: No stridor.   Cardiovascular: Normal rate, regular rhythm. Grossly normal heart sounds.   Respiratory: Normal respiratory effort.  No retractions. Lungs CTAB. Gastrointestinal: Soft with moderate tenderness to the right upper quadrant with a negative Murphy sign. Also with periumbilical tenderness just superior to the umbilicus  without a palpable hernia sac. No distention.No CVA tenderness. Musculoskeletal: No lower extremity tenderness nor edema.  No joint effusions. Neurologic:  Normal speech and language. No gross focal neurologic deficits are appreciated. Skin:  Skin is warm, dry and intact. No rash noted. Psychiatric: Mood and affect are normal. Speech and behavior are normal.  ____________________________________________   LABS (all labs ordered are listed, but only abnormal results are displayed)  Labs Reviewed  CBC WITH  DIFFERENTIAL/PLATELET - Abnormal; Notable for the following:       Result Value   WBC 11.1 (*)    Neutro Abs 7.5 (*)    All other components within normal limits  URINALYSIS COMPLETEWITH MICROSCOPIC ([facility] ONLY) - Abnormal; Notable for the following:    Color, Urine YELLOW (*)    APPearance CLEAR (*)    All other components within normal limits  COMPREHENSIVE METABOLIC PANEL  LIPASE, BLOOD   ____________________________________________  EKG   ____________________________________________  RADIOLOGY  CT Abdomen Pelvis W Contrast (Accession [id number]) (Order [id number])  Imaging  Date: 12/28/2015 Department: [facility] EMERGENCY DEPARTMENT Released By/Authorizing: Orbie Pyo, MD (auto-released)  PACS Images   Show images for CT Abdomen Pelvis W Contrast  Study Result   CLINICAL DATA:  Acute epigastric and right upper quadrant abdominal pain with vomiting beginning this morning.  EXAM: CT ABDOMEN AND PELVIS WITH CONTRAST  TECHNIQUE: Multidetector CT imaging of the abdomen and pelvis was performed using the standard protocol following bolus administration of intravenous contrast.  CONTRAST:  145mL ISOVUE-300 IOPAMIDOL (ISOVUE-300) INJECTION 61%  COMPARISON:  noncontrast AP CT on 12/05/2013  FINDINGS: Lower Chest: No acute findings.  Hepatobiliary: No mass identified. Gallbladder is unremarkable. No evidence of biliary ductal dilatation.  Pancreas:  No mass or inflammatory changes.  Spleen:  Unremarkable.  Adrenals/Urinary Tract: No masses identified. No evidence of hydronephrosis.  Stomach/Bowel: Diffuse colonic diverticulosis is again demonstrated. Mild wall thickening and pericolonic inflammatory change is seen involving the transverse colon, consistent with mild diverticulitis. No evidence of abscess or extraluminal air.  Vascular/Lymphatic: No pathologically enlarged lymph nodes. No abdominal aortic  aneurysm.  Reproductive:  No mass identified.  Other: Stable small supraumbilical ventral hernia and small bilateral inguinal hernias containing only fat. No evidence of herniated bowel loops.  Musculoskeletal:  No suspicious bone lesions identified.  IMPRESSION: Mild acute diverticulitis involving transverse colon. No evidence of abscess or other complication.   Electronically Signed   By: Earle Gell M.D.   On: 12/28/2015 12:04     ____________________________________________   PROCEDURES  Procedure(s) performed:   Procedures  Critical Care performed:   ____________________________________________   INITIAL IMPRESSION / ASSESSMENT AND PLAN / ED COURSE  Pertinent labs & imaging results that were available during my care of the patient were reviewed by me and considered in my medical decision making (see chart for details).  ----------------------------------------- 2:08 PM on 12/28/2015 -----------------------------------------  Patient with mild diverticulitis found on CAT scan. Patient says after 2 doses of morphine his pain is still 5 out of 10. I offered him admission to the hospital but he said he would rather try going home with a course of Percocet as well as Cipro and Flagyl. We discussed dietary changes including increasing the fiber content of his diet. He'll be following up with his primary care doctor. We also discussed strict return precautions including worsening pain, nausea vomiting and especially fever. The patient understands the plan and is willing to comply. Patient able to tolerate by mouth contrast.  Clinical Course     ____________________________________________   FINAL CLINICAL IMPRESSION(S) / ED DIAGNOSES  Diverticulitis. Nausea vomiting and diarrhea.    NEW MEDICATIONS STARTED DURING THIS VISIT:  New Prescriptions   No medications on file     Note:  This document was prepared using Dragon voice recognition software  and may include unintentional dictation errors.    Orbie Pyo, MD 12/28/15 1409

## 2015-12-28 NOTE — ED Triage Notes (Signed)
Reports onset of abd pain this am, bulge noted by umbilicus

## 2016-01-14 ENCOUNTER — Encounter: Payer: Self-pay | Admitting: Emergency Medicine

## 2016-01-14 ENCOUNTER — Emergency Department: Payer: 59

## 2016-01-14 ENCOUNTER — Emergency Department
Admission: EM | Admit: 2016-01-14 | Discharge: 2016-01-14 | Disposition: A | Discharge status: Home / Self Care | Payer: 59 | Attending: Emergency Medicine | Admitting: Emergency Medicine

## 2016-01-14 DIAGNOSIS — J439 Emphysema, unspecified: Secondary | ICD-10-CM | POA: Diagnosis not present

## 2016-01-14 DIAGNOSIS — R109 Unspecified abdominal pain: Secondary | ICD-10-CM | POA: Diagnosis present

## 2016-01-14 DIAGNOSIS — F17210 Nicotine dependence, cigarettes, uncomplicated: Secondary | ICD-10-CM | POA: Insufficient documentation

## 2016-01-14 DIAGNOSIS — N390 Urinary tract infection, site not specified: Secondary | ICD-10-CM | POA: Diagnosis not present

## 2016-01-14 DIAGNOSIS — R319 Hematuria, unspecified: Secondary | ICD-10-CM

## 2016-01-14 HISTORY — DX: Calculus of kidney: N20.0

## 2016-01-14 LAB — CBC
HEMATOCRIT: 45.5 % (ref 40.0–52.0)
Hemoglobin: 15.7 g/dL (ref 13.0–18.0)
MCH: 29.2 pg (ref 26.0–34.0)
MCHC: 34.6 g/dL (ref 32.0–36.0)
MCV: 84.4 fL (ref 80.0–100.0)
Platelets: 196 10*3/uL (ref 150–440)
RBC: 5.39 MIL/uL (ref 4.40–5.90)
RDW: 13.3 % (ref 11.5–14.5)
WBC: 8.3 10*3/uL (ref 3.8–10.6)

## 2016-01-14 LAB — COMPREHENSIVE METABOLIC PANEL
ALT: 25 U/L (ref 17–63)
ANION GAP: 8 (ref 5–15)
AST: 23 U/L (ref 15–41)
Albumin: 4.5 g/dL (ref 3.5–5.0)
Alkaline Phosphatase: 63 U/L (ref 38–126)
BILIRUBIN TOTAL: 0.9 mg/dL (ref 0.3–1.2)
BUN: 13 mg/dL (ref 6–20)
CHLORIDE: 111 mmol/L (ref 101–111)
CO2: 22 mmol/L (ref 22–32)
Calcium: 9.2 mg/dL (ref 8.9–10.3)
Creatinine, Ser: 1.21 mg/dL (ref 0.61–1.24)
Glucose, Bld: 121 mg/dL — ABNORMAL HIGH (ref 65–99)
POTASSIUM: 3.6 mmol/L (ref 3.5–5.1)
Sodium: 141 mmol/L (ref 135–145)
TOTAL PROTEIN: 7.4 g/dL (ref 6.5–8.1)

## 2016-01-14 LAB — URINALYSIS COMPLETE WITH MICROSCOPIC (ARMC ONLY)
BILIRUBIN URINE: NEGATIVE
Glucose, UA: NEGATIVE mg/dL
KETONES UR: NEGATIVE mg/dL
LEUKOCYTES UA: NEGATIVE
NITRITE: NEGATIVE
PH: 5 (ref 5.0–8.0)
PROTEIN: 100 mg/dL — AB
SPECIFIC GRAVITY, URINE: 1.031 — AB (ref 1.005–1.030)
SQUAMOUS EPITHELIAL / LPF: NONE SEEN

## 2016-01-14 LAB — LIPASE, BLOOD: LIPASE: 20 U/L (ref 11–51)

## 2016-01-14 MED ORDER — HYDROMORPHONE HCL 1 MG/ML IJ SOLN
1.0000 mg | Freq: Once | INTRAMUSCULAR | Status: AC
  Administered 2016-01-14: 1 mg via INTRAVENOUS
  Filled 2016-01-14: qty 1

## 2016-01-14 MED ORDER — KETOROLAC TROMETHAMINE 30 MG/ML IJ SOLN
30.0000 mg | Freq: Once | INTRAMUSCULAR | Status: AC
  Administered 2016-01-14: 30 mg via INTRAVENOUS
  Filled 2016-01-14: qty 1

## 2016-01-14 MED ORDER — SULFAMETHOXAZOLE-TRIMETHOPRIM 800-160 MG PO TABS: 1.0000 | ORAL_TABLET | Freq: Two times a day (BID) | ORAL | 0 refills | Status: DC

## 2016-01-14 MED ORDER — ONDANSETRON HCL 4 MG/2ML IJ SOLN
4.0000 mg | Freq: Once | INTRAMUSCULAR | Status: AC
  Administered 2016-01-14: 4 mg via INTRAVENOUS
  Filled 2016-01-14: qty 2

## 2016-01-14 MED ORDER — IOPAMIDOL (ISOVUE-370) INJECTION 76%
100.0000 mL | Freq: Once | INTRAVENOUS | Status: AC | PRN
  Administered 2016-01-14: 100 mL via INTRAVENOUS
  Filled 2016-01-14: qty 100

## 2016-01-14 MED ORDER — SODIUM CHLORIDE 0.9 % IV BOLUS (SEPSIS)
1000.0000 mL | Freq: Once | INTRAVENOUS | Status: AC
  Administered 2016-01-14: 1000 mL via INTRAVENOUS

## 2016-01-14 MED ORDER — OXYCODONE-ACETAMINOPHEN 5-325 MG PO TABS: 1.0000 | ORAL_TABLET | ORAL | 0 refills | Status: DC | PRN

## 2016-01-14 NOTE — ED Notes (Signed)
Patient transported to CT 

## 2016-01-14 NOTE — ED Triage Notes (Signed)
Seen through ED about 2 weeks ago and was diagnosed with diverticulitis-- sent home with antibiotics and pain medication.  States abdominal pain has never completely resolved.  Today the abdominal  Pain is worse, c/o bilateral flank pain and hematuria.

## 2016-01-14 NOTE — ED Notes (Signed)
Pt c/o nausea.  

## 2016-01-14 NOTE — Discharge Instructions (Signed)
Begin taking Bactrim DS twice a day for 14 days. Increase fluids. Follow-up with [facility] clinic [place]  or primary care physician of your choice. Norco if needed for severe pain. Return to the emergency room if any severe worsening of your symptoms, nausea vomiting, fever, chills, inability to urinate.

## 2016-01-14 NOTE — ED Provider Notes (Signed)
[facility] Emergency Department Provider Note  ____________________________________________   First MD Initiated Contact with Patient 01/14/16 1230     (approximate)  I have reviewed the triage vital signs and the nursing notes.   HISTORY  Chief Complaint Abdominal Pain    HPI Brad Mcfarland is a 35 y.o. male is here with complaint of bilateral flank pain and hematuria beginning 2 days ago. Patient states that he occasionally had some initial urinary stream issues where he dribbled. He states he saw some blood in his urine. He states that he has had a history of kidney stones in the past. Patient states he's had nausea since this morning but no vomiting. Patient denies any fever or chills. He states he has had kidney stones in the past. He is currently he does not have a PCP.Patient rates his pain as an 8 out of 10 at this time.  Patient was also in the emergency room on 12/28/15 for abdominal pain at which time he was diagnosed with diverticulitis and sent home on Cipro, Flagyl and pain medication. Patient states that the pain did not completely go away. Patient did not follow-up with his primary care doctor is he has not seen Dr. Kary Kos in over 4 years.   Past Medical History:  Diagnosis Date  . Kidney stones     There are no active problems to display for this patient.   Past Surgical History:  Procedure Laterality Date  . APPENDECTOMY    . WISDOM TOOTH EXTRACTION      Prior to Admission medications   Medication Sig Start Date End Date Taking? Authorizing Provider  oxyCODONE-acetaminophen (PERCOCET) 5-325 MG tablet Take 1 tablet by mouth every 4 (four) hours as needed for severe pain. 01/14/16   Johnn Hai, PA-C  sulfamethoxazole-trimethoprim (BACTRIM DS,SEPTRA DS) 800-160 MG tablet Take 1 tablet by mouth 2 (two) times daily. 01/14/16   Johnn Hai, PA-C    Allergies Pepto-bismol [bismuth]  No family history on file.  Social  History Social History  Substance Use Topics  . Smoking status: Heavy Tobacco Smoker    Packs/day: 2.00    Types: Cigarettes  . Smokeless tobacco: Never Used  . Alcohol use No    Review of Systems Constitutional: No fever/chills ENT: No sore throat. Cardiovascular: Denies chest pain. Respiratory: Denies shortness of breath. Gastrointestinal: No abdominal pain.  Positive nausea, no vomiting.  No diarrhea.  No constipation. Genitourinary: Positive for hematuria Musculoskeletal: Positive back pain. Skin: Negative for rash. Neurological: Negative for headaches, focal weakness or numbness.  10-point ROS otherwise negative.  ____________________________________________   PHYSICAL EXAM:  VITAL SIGNS: ED Triage Vitals [01/14/16 1129]  Enc Vitals Group     BP 125/83     Pulse Rate 96     Resp 16     Temp 97.9 F (36.6 C)     Temp Source Oral     SpO2 97 %     Weight 250 lb (113.4 kg)     Height 6\' 2"  (1.88 m)     Head Circumference      Peak Flow      Pain Score 8     Pain Loc      Pain Edu?      Excl. in [place]?     Constitutional: Alert and oriented. Well appearing and in no acute distress. Eyes: Conjunctivae are normal. PERRL. EOMI. Head: Atraumatic. Nose: No congestion/rhinnorhea. Neck: No stridor.   Cardiovascular: Normal rate, regular  rhythm. Grossly normal heart sounds.  Good peripheral circulation. Respiratory: Normal respiratory effort.  No retractions. Lungs CTAB. Gastrointestinal: Soft With minimal diffuse tenderness. No distention. Bowel sounds normoactive 4 quadrants. Tender to percussion bilateral CVA areas and soft tissue tender bilaterally as well. Musculoskeletal: Moves upper and lower extremities without any difficulty. Normal gait was noted. Neurologic:  Normal speech and language. No gross focal neurologic deficits are appreciated. No gait instability. Skin:  Skin is warm, dry and intact. No rash noted. Psychiatric: Mood and affect are normal. Speech  and behavior are normal.  ____________________________________________   LABS (all labs ordered are listed, but only abnormal results are displayed)  Labs Reviewed  COMPREHENSIVE METABOLIC PANEL - Abnormal; Notable for the following:       Result Value   Glucose, Bld 121 (*)    All other components within normal limits  URINALYSIS COMPLETEWITH MICROSCOPIC ([facility] ONLY) - Abnormal; Notable for the following:    Color, Urine AMBER (*)    APPearance CLOUDY (*)    Specific Gravity, Urine 1.031 (*)    Hgb urine dipstick 3+ (*)    Protein, ur 100 (*)    Bacteria, UA RARE (*)    All other components within normal limits  URINE CULTURE  LIPASE, BLOOD  CBC    RADIOLOGY CT scan without contrast for renal stone study was negative for stone per radiologist. CT angiogram of chest/abdomen/pelvis per radiologist: IMPRESSION:  1. Negative for acute PE or thoracic aortic dissection.  2. No significant intra abdominal vascular findings.  3. Colonic diverticulosis as before.  4. Pulmonary emphysema.     ____________________________________________   PROCEDURES  Procedure(s) performed: None  Procedures  Critical Care performed: No  ____________________________________________   INITIAL IMPRESSION / ASSESSMENT AND PLAN / ED COURSE  Pertinent labs & imaging results that were available during my care of the patient were reviewed by me and considered in my medical decision making (see chart for details).  Discussed with Dr. Clearnce Hasten while patient was in the emergency room. CT angiogram chest, abdomen, pelvis was ordered.   Clinical Course  Patient has some nausea while in the emergency room was given Zofran IV and nausea was resolved. Urine culture was ordered. At this time patient was placed on Septra DS twice a day for 14 days along with Percocet as needed for pain. Patient is follow-up with [facility] clinic acute care or PCP of his choice.Patient was encouraged to drink fluids. He  is to return to the emergency room if any severe or sudden worsening of his symptoms such as fever, vomiting, chest pain or inability to take antibiotics.  ___ _________________________________________   FINAL CLINICAL IMPRESSION(S) / ED DIAGNOSES  Final diagnoses:  Flank pain  Hematuria  Urinary tract infection with hematuria, site unspecified      NEW MEDICATIONS STARTED DURING THIS VISIT:  Discharge Medication List as of 01/14/2016  3:54 PM    START taking these medications   Details  sulfamethoxazole-trimethoprim (BACTRIM DS,SEPTRA DS) 800-160 MG tablet Take 1 tablet by mouth 2 (two) times daily., Starting Thu 01/14/2016, Print         Note:  This document was prepared using Dragon voice recognition software and may include unintentional dictation errors.    Johnn Hai, PA-C 01/15/16 [place], MD 01/16/16 [phone]

## 2016-01-16 LAB — URINE CULTURE
Culture: NO GROWTH
SPECIAL REQUESTS: NORMAL

## 2016-07-02 ENCOUNTER — Emergency Department: Payer: No Typology Code available for payment source

## 2016-07-02 ENCOUNTER — Emergency Department
Admission: EM | Admit: 2016-07-02 | Discharge: 2016-07-02 | Disposition: A | Discharge status: Home / Self Care | Payer: No Typology Code available for payment source | Attending: Emergency Medicine | Admitting: Emergency Medicine

## 2016-07-02 DIAGNOSIS — Z888 Allergy status to other drugs, medicaments and biological substances status: Secondary | ICD-10-CM | POA: Insufficient documentation

## 2016-07-02 DIAGNOSIS — R0602 Shortness of breath: Secondary | ICD-10-CM

## 2016-07-02 DIAGNOSIS — T782XXA Anaphylactic shock, unspecified, initial encounter: Secondary | ICD-10-CM | POA: Insufficient documentation

## 2016-07-02 DIAGNOSIS — F17210 Nicotine dependence, cigarettes, uncomplicated: Secondary | ICD-10-CM | POA: Insufficient documentation

## 2016-07-02 LAB — BASIC METABOLIC PANEL
ANION GAP: 7 (ref 5–15)
BUN: 17 mg/dL (ref 6–20)
CO2: 25 mmol/L (ref 22–32)
Calcium: 9 mg/dL (ref 8.9–10.3)
Chloride: 107 mmol/L (ref 101–111)
Creatinine, Ser: 1.2 mg/dL (ref 0.61–1.24)
GFR calc Af Amer: >60 mL/min (ref >60)
Glucose, Bld: 105 mg/dL — ABNORMAL HIGH (ref 65–99)
POTASSIUM: 4.3 mmol/L (ref 3.5–5.1)
SODIUM: 139 mmol/L (ref 135–145)

## 2016-07-02 LAB — CBC WITH DIFFERENTIAL/PLATELET
BASOS ABS: 0.1 10*3/uL (ref 0–0.1)
BASOS PCT: 1 %
Eosinophils Absolute: 0.2 10*3/uL (ref 0–0.7)
Eosinophils Relative: 2 %
HEMATOCRIT: 46.2 % (ref 40.0–52.0)
Hemoglobin: 16 g/dL (ref 13.0–18.0)
Lymphocytes Relative: 28 %
Lymphs Abs: 2.5 10*3/uL (ref 1.0–3.6)
MCH: 29.4 pg (ref 26.0–34.0)
MCHC: 34.7 g/dL (ref 32.0–36.0)
MCV: 84.6 fL (ref 80.0–100.0)
MONO ABS: 0.5 10*3/uL (ref 0.2–1.0)
Monocytes Relative: 6 %
NEUTROS ABS: 5.5 10*3/uL (ref 1.4–6.5)
Neutrophils Relative %: 63 %
PLATELETS: 212 10*3/uL (ref 150–440)
RBC: 5.46 MIL/uL (ref 4.40–5.90)
RDW: 13.6 % (ref 11.5–14.5)
WBC: 8.7 10*3/uL (ref 3.8–10.6)

## 2016-07-02 LAB — TROPONIN I

## 2016-07-02 MED ORDER — DIPHENHYDRAMINE HCL 50 MG/ML IJ SOLN
25.0000 mg | Freq: Once | INTRAMUSCULAR | Status: AC
  Administered 2016-07-02: 25 mg via INTRAVENOUS

## 2016-07-02 MED ORDER — FAMOTIDINE IN NACL 20-0.9 MG/50ML-% IV SOLN
20.0000 mg | Freq: Once | INTRAVENOUS | Status: AC
  Administered 2016-07-02: 20 mg via INTRAVENOUS

## 2016-07-02 MED ORDER — EPINEPHRINE 0.3 MG/0.3ML IJ SOAJ
INTRAMUSCULAR | Status: AC
  Administered 2016-07-02: 0.3 mg via INTRAMUSCULAR
  Filled 2016-07-02: qty 0.3

## 2016-07-02 MED ORDER — EPINEPHRINE 0.3 MG/0.3ML IJ SOAJ: 0.3000 mg | Freq: Once | INTRAMUSCULAR | 2 refills | Status: AC

## 2016-07-02 MED ORDER — DIPHENHYDRAMINE HCL 50 MG/ML IJ SOLN
INTRAMUSCULAR | Status: AC
  Administered 2016-07-02: 25 mg via INTRAVENOUS
  Filled 2016-07-02: qty 1

## 2016-07-02 MED ORDER — FAMOTIDINE IN NACL 20-0.9 MG/50ML-% IV SOLN
INTRAVENOUS | Status: AC
  Administered 2016-07-02: 20 mg via INTRAVENOUS
  Filled 2016-07-02: qty 50

## 2016-07-02 MED ORDER — METHYLPREDNISOLONE SODIUM SUCC 125 MG IJ SOLR
INTRAMUSCULAR | Status: AC
  Administered 2016-07-02: 125 mg via INTRAVENOUS
  Filled 2016-07-02: qty 2

## 2016-07-02 MED ORDER — EPINEPHRINE 0.3 MG/0.3ML IJ SOAJ
0.3000 mg | Freq: Once | INTRAMUSCULAR | Status: AC
  Administered 2016-07-02: 0.3 mg via INTRAMUSCULAR

## 2016-07-02 MED ORDER — ASPIRIN 81 MG PO CHEW
CHEWABLE_TABLET | ORAL | Status: AC
  Administered 2016-07-02: 324 mg via ORAL
  Filled 2016-07-02: qty 4

## 2016-07-02 MED ORDER — ASPIRIN 81 MG PO CHEW
324.0000 mg | CHEWABLE_TABLET | Freq: Once | ORAL | Status: AC
  Administered 2016-07-02: 324 mg via ORAL

## 2016-07-02 MED ORDER — PREDNISONE 10 MG PO TABS: ORAL_TABLET | ORAL | 0 refills | Status: DC

## 2016-07-02 MED ORDER — METHYLPREDNISOLONE SODIUM SUCC 125 MG IJ SOLR
125.0000 mg | Freq: Once | INTRAMUSCULAR | Status: AC
  Administered 2016-07-02: 125 mg via INTRAVENOUS

## 2016-07-02 NOTE — ED Provider Notes (Addendum)
[facility] Emergency Department Provider Note ____________________________________________   I have reviewed the triage vital signs and the triage nursing note.  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 male presents with hives and lip swelling and chest pain/tightness noted upon awakening around 6:30am.  Patient has no known history of allergic reactions. No new foods or other procedures.  Patient states he woke up and noticed itching and hives on his chest wall below his arms. He notes swelling to the top lip. Denies tongue swelling or trouble swallowing. He does feel like he is having chest tightness and mild chest pain. He feels some dyspnea.  No pleuritic chest pain. He is flushed in the face.  He did at home take 25 mg Benadryl tablet. No improvement.    Past Medical History:  Diagnosis Date  . Kidney stones     There are no active problems to display for this patient.   Past Surgical History:  Procedure Laterality Date  . APPENDECTOMY    . WISDOM TOOTH EXTRACTION      Prior to Admission medications   Medication Sig Start Date End Date Taking? Authorizing Provider  EPINEPHrine (AUVI-Q) 0.3 mg/0.3 mL IJ SOAJ injection Inject 0.3 mLs (0.3 mg total) into the muscle once. 07/02/16 07/02/16  Lisa Roca, MD  EPINEPHrine (EPIPEN 2-PAK) 0.3 mg/0.3 mL IJ SOAJ injection Inject 0.3 mLs (0.3 mg total) into the muscle once. 07/02/16 07/02/16  Lisa Roca, MD  EPINEPHrine 0.3 mg/0.3 mL IJ SOAJ injection Inject 0.3 mLs (0.3 mg total) into the muscle once. Inject 0.66mls (0.3mg  total) into muscle of outer thigh once as needed for severe allergic reaction called anaphylaxis. 07/02/16 07/02/16  Lisa Roca, MD  oxyCODONE-acetaminophen (PERCOCET) 5-325 MG tablet Take 1 tablet by mouth every 4 (four) hours as needed for severe pain. 01/14/16   Johnn Hai, PA-C  predniSONE (DELTASONE)  10 MG tablet 50mg  for 1 day then 40mg  for 1 day then 30mg  for 1 day then 20mg  for 1 day then  10mg  for 1 day 07/02/16   Lisa Roca, MD  sulfamethoxazole-trimethoprim (BACTRIM DS,SEPTRA DS) 800-160 MG tablet Take 1 tablet by mouth 2 (two) times daily. 01/14/16   Johnn Hai, PA-C    Allergies  Allergen Reactions  . Pepto-Bismol [Bismuth] Nausea And Vomiting    No family history on file.  Social History Social History  Substance Use Topics  . Smoking status: Heavy Tobacco Smoker    Packs/day: 2.00    Types: Cigarettes  . Smokeless tobacco: Never Used  . Alcohol use No    Review of Systems  Constitutional: Negative for fever. Eyes: Negative for visual changes. ENT: Negative for sore throat. Cardiovascular: Positive for chest pain. Respiratory: Positive for shortness of breath. Gastrointestinal: Negative for abdominal pain, vomiting and diarrhea. Genitourinary: Negative for dysuria. Musculoskeletal: Negative for back pain. Skin: Positive for rash. Neurological: Negative for headache. 10 point Review of Systems otherwise negative ____________________________________________   PHYSICAL EXAM:  VITAL SIGNS: ED Triage Vitals  Enc Vitals Group     BP 07/02/16 0900 (!) 162/108     Pulse Rate 07/02/16 0900 78     Resp 07/02/16 0900 15     Temp 07/02/16 0902 97.8 F (36.6 C)     Temp Source 07/02/16 0902 Oral     SpO2 07/02/16 0900 100 %     Weight 07/02/16 0851 260 lb (117.9 kg)  Height 07/02/16 0851 6\' 2"  (1.88 m)     Head Circumference --      Peak Flow --      Pain Score --      Pain Loc --      Pain Edu? --      Excl. in [place]? --      Constitutional: Alert and oriented. Looks like he is quite uncomfortable, red in the face, breathing slowly and holding onto his chest at times. HEENT   Head: Normocephalic and atraumatic.  Small hive/welt forehead.      Eyes: Conjunctivae are normal. PERRL. Normal extraocular movements.      Ears:         Nose: No  congestion/rhinnorhea.   Mouth/Throat: Mucous membranes are moist.  Patient reported lip swelling, not visible on my inspection. Tongue appears normal.   Neck: No stridor. Cardiovascular/Chest: Normal rate, regular rhythm.  No murmurs, rubs, or gallops. Respiratory: Normal respiratory effort without tachypnea nor retractions. Breath sounds are clear and equal bilaterally. No wheezes/rales/rhonchi. Gastrointestinal: Soft. No distention, no guarding, no rebound. Nontender.    Genitourinary/rectal:Deferred Musculoskeletal: Nontender with normal range of motion in all extremities. No joint effusions.  No lower extremity tenderness.  No edema. Neurologic:  Normal speech and language. No gross or focal neurologic deficits are appreciated. Skin:  Skin is warm, dry and intact.  Flushed face and chest diffusely.  Hives under arms,on lateral chest wall.  Small hive/welt forehead, right foot. Psychiatric: Mood and affect are normal. Speech and behavior are normal. Patient exhibits appropriate insight and judgment.   ____________________________________________  LABS (pertinent positives/negatives)  Labs Reviewed  BASIC METABOLIC PANEL - Abnormal; Notable for the following:       Result Value   Glucose, Bld 105 (*)    All other components within normal limits  TROPONIN I  CBC WITH DIFFERENTIAL/PLATELET    ____________________________________________    EKG I, Lisa Roca, MD, the attending physician have personally viewed and interpreted all ECGs.  80 bpm. Normal sinus rhythm. Narrow QRS. Normal axis. Normal ST and T-wave. ____________________________________________  RADIOLOGY All Xrays were viewed by me. Imaging interpreted by Radiologist.  Chest x-ray portable: No active disease __________________________________________  PROCEDURES  Procedure(s) performed: None  Critical Care performed: CRITICAL CARE Performed by: Lisa Roca   Total critical care time: 30  minutes  Critical care time was exclusive of separately billable procedures and treating other patients.  Critical care was necessary to treat or prevent imminent or life-threatening deterioration.  Critical care was time spent personally by me on the following activities: development of treatment plan with patient and/or surrogate as well as nursing, discussions with consultants, evaluation of patient's response to treatment, examination of patient, obtaining history from patient or surrogate, ordering and performing treatments and interventions, ordering and review of laboratory studies, ordering and review of radiographic studies, pulse oximetry and re-evaluation of patient's condition.   ____________________________________________   ED COURSE / ASSESSMENT AND PLAN  Pertinent labs & imaging results that were available during my care of the patient were reviewed by me and considered in my medical decision making (see chart for details).   I was called to room for a patient with allergic reaction. The patient was flushed all over and complaining of itching as well as central chest pain. Patient's spouse were concern for allergic reaction. Patient has no history of allergic reaction, no known trigger or exposure.  On exam he is complaining of shortness of breath, but no hypoxia. No  tachycardia. He is complaining of some central chest tightness and pressure and pain. His EKG was normal in appearance. Initially he just looked flushed, but he did have some hives on his chest wall. He taken Benadryl at home and was not getting any better.  Given multiple systems involved, cardiac/respiratory and skin, clinically anaphylaxis.  Patient given epi autoinjector.  We discussed use and had him self administer, as practice.  He experienced relief of chest discomfort, lip swelling, shortness of breath, within about 5 minutes. His high blood pressure came down as well.  He is receiving adjunctive  allergic reaction treatments pepcid, Solu-Medrol and Benadryl. We discussed these as adjunctive treatment singes to proceed.  Labs and CXR were obtained due chest pain initially.  All reassuring.  I am not suspicous for CAD.  We discussed epi and autoinjector is no given prescription for multiple types to see what his insurance will cover.  We discussed duration of ER monitoring, no specific guideline.  His wife is an EMT, they have access to epi at home and have RX for epi autoinjector.   Symptoms resolved.  I think it's reasonable for discharge, they understand return precautions.    CONSULTATIONS:   None   Patient / Family / Caregiver informed of clinical course, medical decision-making process, and agree with plan.   I discussed return precautions, follow-up instructions, and discharge instructions with patient and/or family.  Discharge instructions:  You were treated for anaphylaxis, system wide severe allergic reaction and were given epinephrine as well as pepcid, benadryl and solumedrol in the ER.  Take Benadryl 25 mg every 4 hours for any skin rash or itching.  Take over-the-counter Zantac 150 mg daily for 4 more days.  Take prescription prednisone steroid as directed.  Return to emergency department immediately for any trouble breathing, chest pain, vomiting or swelling with allergic reaction, or anytime you use the epinephrine auto injector.  Although rare, there is a chance of recurrence of this anaphylaxis over the next few days or re exposure. Use the epi autoinjector if you have recurrent anaphylaxis with trouble breathing, chest tightness, throat or tongue swelling, trouble swallowing, passing out, or vomiting and associated hives/itching. ___________________________________________   FINAL CLINICAL IMPRESSION(S) / ED DIAGNOSES   Final diagnoses:  SOB (shortness of breath)  Anaphylaxis, initial encounter              Note: This dictation was prepared  with Dragon dictation. Any transcriptional errors that result from this process are unintentional    Lisa Roca, MD 07/02/16 [place], MD 07/02/16 1047

## 2016-07-02 NOTE — Discharge Instructions (Signed)
You were treated for anaphylaxis, system wide severe allergic reaction and were given epinephrine as well as pepcid, benadryl and solumedrol in the ER.  Take Benadryl 25 mg every 4 hours for any skin rash or itching.  Take over-the-counter Zantac 150 mg daily for 4 more days.  Take prescription prednisone steroid as directed.  Return to emergency department immediately for any trouble breathing, chest pain, vomiting or swelling with allergic reaction, or anytime you use the epinephrine auto injector.  Although rare, there is a chance of recurrence of this anaphylaxis over the next few days or reexposure. Use the epi autoinjector if you have recurrent anaphylaxis with trouble breathing, chest tightness, throat or tongue swelling, trouble swallowing, passing out, or vomiting and associated hives/itching.

## 2016-07-02 NOTE — ED Triage Notes (Signed)
Pt came to ED from home. Pt reports woke up at 0630 and broke out into hives on arms, back,forehead. Pt reports some itching. Has lip swelling, c/o sob and chest pressure. Took benadryl at home.

## 2017-04-10 ENCOUNTER — Other Ambulatory Visit: Payer: Self-pay | Admitting: Sports Medicine

## 2017-04-10 DIAGNOSIS — M25562 Pain in left knee: Secondary | ICD-10-CM

## 2017-04-15 ENCOUNTER — Ambulatory Visit
Admission: RE | Admit: 2017-04-15 | Discharge: 2017-04-15 | Disposition: A | Discharge status: Home / Self Care | Payer: No Typology Code available for payment source | Source: Ambulatory Visit | Attending: Sports Medicine | Admitting: Sports Medicine

## 2017-04-15 DIAGNOSIS — M25562 Pain in left knee: Secondary | ICD-10-CM

## 2017-04-22 HISTORY — PX: KNEE ARTHROSCOPY: SUR90

## 2017-10-07 ENCOUNTER — Emergency Department: Payer: Self-pay

## 2017-10-07 ENCOUNTER — Encounter: Payer: Self-pay | Admitting: Emergency Medicine

## 2017-10-07 ENCOUNTER — Other Ambulatory Visit: Payer: Self-pay

## 2017-10-07 ENCOUNTER — Emergency Department
Admission: EM | Admit: 2017-10-07 | Discharge: 2017-10-07 | Disposition: A | Discharge status: Home / Self Care | Payer: Self-pay | Attending: Emergency Medicine | Admitting: Emergency Medicine

## 2017-10-07 DIAGNOSIS — S161XXA Strain of muscle, fascia and tendon at neck level, initial encounter: Secondary | ICD-10-CM | POA: Insufficient documentation

## 2017-10-07 DIAGNOSIS — W500XXA Accidental hit or strike by another person, initial encounter: Secondary | ICD-10-CM | POA: Insufficient documentation

## 2017-10-07 DIAGNOSIS — F17210 Nicotine dependence, cigarettes, uncomplicated: Secondary | ICD-10-CM | POA: Insufficient documentation

## 2017-10-07 DIAGNOSIS — R1310 Dysphagia, unspecified: Secondary | ICD-10-CM

## 2017-10-07 DIAGNOSIS — Y998 Other external cause status: Secondary | ICD-10-CM | POA: Insufficient documentation

## 2017-10-07 DIAGNOSIS — Y9383 Activity, rough housing and horseplay: Secondary | ICD-10-CM | POA: Insufficient documentation

## 2017-10-07 DIAGNOSIS — Y92016 Swimming-pool in single-family (private) house or garden as the place of occurrence of the external cause: Secondary | ICD-10-CM | POA: Insufficient documentation

## 2017-10-07 LAB — CBC WITH DIFFERENTIAL/PLATELET
Basophils Absolute: 0.1 10*3/uL (ref 0–0.1)
Basophils Relative: 1 %
EOS ABS: 0.2 10*3/uL (ref 0–0.7)
EOS PCT: 2 %
HCT: 47.7 % (ref 40.0–52.0)
Hemoglobin: 16.5 g/dL (ref 13.0–18.0)
LYMPHS ABS: 2.8 10*3/uL (ref 1.0–3.6)
Lymphocytes Relative: 23 %
MCH: 29.5 pg (ref 26.0–34.0)
MCHC: 34.6 g/dL (ref 32.0–36.0)
MCV: 85.4 fL (ref 80.0–100.0)
MONO ABS: 0.6 10*3/uL (ref 0.2–1.0)
Monocytes Relative: 5 %
Neutro Abs: 8.7 10*3/uL — ABNORMAL HIGH (ref 1.4–6.5)
Neutrophils Relative %: 69 %
PLATELETS: 199 10*3/uL (ref 150–440)
RBC: 5.59 MIL/uL (ref 4.40–5.90)
RDW: 13.5 % (ref 11.5–14.5)
WBC: 12.5 10*3/uL — ABNORMAL HIGH (ref 3.8–10.6)

## 2017-10-07 LAB — BASIC METABOLIC PANEL
Anion gap: 7 (ref 5–15)
BUN: 15 mg/dL (ref 6–20)
CALCIUM: 9.3 mg/dL (ref 8.9–10.3)
CO2: 27 mmol/L (ref 22–32)
CREATININE: 1.2 mg/dL (ref 0.61–1.24)
Chloride: 108 mmol/L (ref 98–111)
GFR calc Af Amer: >60 mL/min (ref >60)
GLUCOSE: 92 mg/dL (ref 70–99)
Potassium: 4.4 mmol/L (ref 3.5–5.1)
SODIUM: 142 mmol/L (ref 135–145)

## 2017-10-07 LAB — ETHANOL

## 2017-10-07 MED ORDER — OXYCODONE-ACETAMINOPHEN 5-325 MG PO TABS: 1.0000 | ORAL_TABLET | Freq: Three times a day (TID) | ORAL | 0 refills | Status: DC | PRN

## 2017-10-07 MED ORDER — HYDROMORPHONE HCL 1 MG/ML IJ SOLN
0.5000 mg | Freq: Once | INTRAMUSCULAR | Status: AC
  Administered 2017-10-07: 0.5 mg via INTRAVENOUS
  Filled 2017-10-07: qty 1

## 2017-10-07 MED ORDER — HYDROMORPHONE HCL 1 MG/ML IJ SOLN
1.0000 mg | Freq: Once | INTRAMUSCULAR | Status: AC
  Administered 2017-10-07: 1 mg via INTRAVENOUS
  Filled 2017-10-07: qty 1

## 2017-10-07 MED ORDER — IOHEXOL 300 MG/ML  SOLN
75.0000 mL | Freq: Once | INTRAMUSCULAR | Status: AC | PRN
  Administered 2017-10-07: 75 mL via INTRAVENOUS

## 2017-10-07 NOTE — ED Notes (Signed)
Patient transported to CT 

## 2017-10-07 NOTE — ED Notes (Signed)
ED Provider at bedside. 
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
## 2017-10-07 NOTE — ED Notes (Signed)
Pt returned from ct

## 2018-01-11 ENCOUNTER — Other Ambulatory Visit: Payer: Self-pay

## 2018-01-11 ENCOUNTER — Emergency Department: Payer: Self-pay

## 2018-01-11 ENCOUNTER — Emergency Department
Admission: EM | Admit: 2018-01-11 | Discharge: 2018-01-11 | Disposition: A | Discharge status: Home / Self Care | Payer: Self-pay | Attending: Emergency Medicine | Admitting: Emergency Medicine

## 2018-01-11 ENCOUNTER — Encounter: Payer: Self-pay | Admitting: Intensive Care

## 2018-01-11 DIAGNOSIS — M25562 Pain in left knee: Secondary | ICD-10-CM | POA: Insufficient documentation

## 2018-01-11 DIAGNOSIS — Z79899 Other long term (current) drug therapy: Secondary | ICD-10-CM | POA: Insufficient documentation

## 2018-01-11 DIAGNOSIS — M25462 Effusion, left knee: Secondary | ICD-10-CM | POA: Insufficient documentation

## 2018-01-11 DIAGNOSIS — F17210 Nicotine dependence, cigarettes, uncomplicated: Secondary | ICD-10-CM | POA: Insufficient documentation

## 2018-01-11 MED ORDER — MELOXICAM 15 MG PO TABS: 15.0000 mg | ORAL_TABLET | Freq: Every day | ORAL | 0 refills | Status: DC

## 2018-01-11 MED ORDER — MORPHINE SULFATE (PF) 4 MG/ML IV SOLN
4.0000 mg | Freq: Once | INTRAVENOUS | Status: AC
  Administered 2018-01-11: 4 mg via INTRAMUSCULAR
  Filled 2018-01-11: qty 1

## 2018-01-11 MED ORDER — HYDROCODONE-ACETAMINOPHEN 5-325 MG PO TABS: 1.0000 | ORAL_TABLET | Freq: Four times a day (QID) | ORAL | 0 refills | Status: DC | PRN

## 2018-01-11 NOTE — Discharge Instructions (Addendum)
Please rest ice and elevate the left knee.  He may work on gentle knee range of motion as tolerated.  Use crutches to help with weightbearing until you are able to ambulate without a limp.  Call orthopedic office tomorrow to schedule follow-up appointment
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
## 2018-01-11 NOTE — ED Triage Notes (Signed)
Patient reports during physical activity today feeling his L knee pop. Had a cyst removed in February from L knee. Able to ambulate on knee with pain

## 2018-04-07 ENCOUNTER — Emergency Department
Admission: EM | Admit: 2018-04-07 | Discharge: 2018-04-08 | Disposition: A | Discharge status: Home / Self Care | Payer: Self-pay | Attending: Emergency Medicine | Admitting: Emergency Medicine

## 2018-04-07 ENCOUNTER — Other Ambulatory Visit: Payer: Self-pay

## 2018-04-07 DIAGNOSIS — R110 Nausea: Secondary | ICD-10-CM | POA: Insufficient documentation

## 2018-04-07 DIAGNOSIS — F17210 Nicotine dependence, cigarettes, uncomplicated: Secondary | ICD-10-CM | POA: Insufficient documentation

## 2018-04-07 DIAGNOSIS — R1033 Periumbilical pain: Secondary | ICD-10-CM | POA: Insufficient documentation

## 2018-04-07 LAB — COMPREHENSIVE METABOLIC PANEL
ALK PHOS: 70 U/L (ref 38–126)
ALT: 23 U/L (ref 0–44)
ANION GAP: 6 (ref 5–15)
AST: 17 U/L (ref 15–41)
Albumin: 4.9 g/dL (ref 3.5–5.0)
BUN: 15 mg/dL (ref 6–20)
CALCIUM: 9.3 mg/dL (ref 8.9–10.3)
CO2: 25 mmol/L (ref 22–32)
Chloride: 105 mmol/L (ref 98–111)
Creatinine, Ser: 1.04 mg/dL (ref 0.61–1.24)
GFR calc non Af Amer: >60 mL/min (ref >60)
Glucose, Bld: 92 mg/dL (ref 70–99)
Potassium: 3.8 mmol/L (ref 3.5–5.1)
SODIUM: 136 mmol/L (ref 135–145)
Total Bilirubin: 0.7 mg/dL (ref 0.3–1.2)
Total Protein: 8 g/dL (ref 6.5–8.1)

## 2018-04-07 LAB — URINALYSIS, COMPLETE (UACMP) WITH MICROSCOPIC
Bacteria, UA: NONE SEEN
Bilirubin Urine: NEGATIVE
Glucose, UA: NEGATIVE mg/dL
HGB URINE DIPSTICK: NEGATIVE
Ketones, ur: NEGATIVE mg/dL
Leukocytes, UA: NEGATIVE
Nitrite: NEGATIVE
PH: 5 (ref 5.0–8.0)
PROTEIN: NEGATIVE mg/dL
SQUAMOUS EPITHELIAL / LPF: NONE SEEN (ref 0–5)
Specific Gravity, Urine: 1.025 (ref 1.005–1.030)

## 2018-04-07 LAB — CBC
HCT: 49.3 % (ref 39.0–52.0)
HEMOGLOBIN: 16.5 g/dL (ref 13.0–17.0)
MCH: 28.9 pg (ref 26.0–34.0)
MCHC: 33.5 g/dL (ref 30.0–36.0)
MCV: 86.3 fL (ref 80.0–100.0)
Platelets: 247 10*3/uL (ref 150–400)
RBC: 5.71 MIL/uL (ref 4.22–5.81)
RDW: 12.7 % (ref 11.5–15.5)
WBC: 11.1 10*3/uL — AB (ref 4.0–10.5)
nRBC: 0 % (ref 0.0–0.2)

## 2018-04-07 LAB — LIPASE, BLOOD: Lipase: 31 U/L (ref 11–51)

## 2018-04-07 MED ORDER — ONDANSETRON HCL 4 MG/2ML IJ SOLN
4.0000 mg | Freq: Once | INTRAMUSCULAR | Status: AC
  Administered 2018-04-08: 4 mg via INTRAVENOUS
  Filled 2018-04-07: qty 2

## 2018-04-07 MED ORDER — MORPHINE SULFATE (PF) 4 MG/ML IV SOLN
4.0000 mg | Freq: Once | INTRAVENOUS | Status: AC
  Administered 2018-04-08: 4 mg via INTRAVENOUS
  Filled 2018-04-07: qty 1

## 2018-04-07 MED ORDER — SODIUM CHLORIDE 0.9 % IV BOLUS
1000.0000 mL | Freq: Once | INTRAVENOUS | Status: AC
  Administered 2018-04-08: 1000 mL via INTRAVENOUS

## 2018-04-07 NOTE — ED Triage Notes (Signed)
A&O, ambulatory.   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 States abd pain and nausea. Pain x 3 days. Normal BM. No vomiting.

## 2018-04-07 NOTE — ED Notes (Signed)
Pt to stat desk to ask about wait time. Pt informed that he will be seen but this RN cannot give out times. Pt verbalized understanding and is ambulating around lobby at this time with steady gait and in NAD.

## 2018-04-07 NOTE — ED Provider Notes (Signed)
[facility] Emergency Department Provider Note   ____________________________________________   First MD Initiated Contact with Patient 04/07/18 2345     (approximate)  I have reviewed the triage vital signs and the nursing notes.   HISTORY  Chief Complaint Abdominal pain   HPI Brad Mcfarland is a 38 y.o. male who presents to the ED from home with a chief complaint of abdominal pain.  Patient has a history of diverticulitis and umbilical hernia.  States umbilical hernia has "hurt for years", but never like this.  Reports a 2 to 3-day history of periumbilical pain associated with nausea.  Denies associated fever, chills, chest pain, shortness of breath, dysuria, diarrhea, testicular pain or swelling.  Denies recent travel or trauma.  Denies anticoagulant use.   Past Medical History:  Diagnosis Date  . Kidney stones     There are no active problems to display for this patient.   Past Surgical History:  Procedure Laterality Date  . APPENDECTOMY    . WISDOM TOOTH EXTRACTION      Prior to Admission medications   Medication Sig Start Date End Date Taking? Authorizing Provider  HYDROcodone-acetaminophen (NORCO) 5-325 MG tablet Take 1 tablet by mouth every 6 (six) hours as needed for moderate pain. 01/11/18   Duanne Guess, PA-C  meloxicam (MOBIC) 15 MG tablet Take 1 tablet (15 mg total) by mouth daily. 01/11/18   Duanne Guess, PA-C  ondansetron (ZOFRAN ODT) 4 MG disintegrating tablet Take 1 tablet (4 mg total) by mouth every 8 (eight) hours as needed for nausea or vomiting. 04/08/18   Paulette Blanch, MD  oxyCODONE-acetaminophen (PERCOCET/ROXICET) 5-325 MG tablet Take 1 tablet by mouth every 4 (four) hours as needed for severe pain. 04/08/18   Paulette Blanch, MD  predniSONE (DELTASONE) 10 MG tablet 50mg  for 1 day then 40mg  for 1 day then 30mg  for 1 day then 20mg  for 1 day then  10mg  for 1 day 07/02/16   Lisa Roca, MD  sulfamethoxazole-trimethoprim  (BACTRIM DS,SEPTRA DS) 800-160 MG tablet Take 1 tablet by mouth 2 (two) times daily. 01/14/16   Johnn Hai, PA-C    Allergies Pepto-bismol [bismuth]  History reviewed. No pertinent family history.  Social History Social History   Tobacco Use  . Smoking status: Heavy Tobacco Smoker    Packs/day: 2.00    Types: Cigarettes  . Smokeless tobacco: Never Used  Substance Use Topics  . Alcohol use: Yes  . Drug use: No    Review of Systems  Constitutional: No fever/chills Eyes: No visual changes. ENT: No sore throat. Cardiovascular: Denies chest pain. Respiratory: Denies shortness of breath. Gastrointestinal: Positive for abdominal pain and nausea, no vomiting.  No diarrhea.  No constipation. Genitourinary: Negative for dysuria. Musculoskeletal: Negative for back pain. Skin: Negative for rash. Neurological: Negative for headaches, focal weakness or numbness.   ____________________________________________   PHYSICAL EXAM:  VITAL SIGNS: ED Triage Vitals  Enc Vitals Group     BP 04/07/18 1824 (!) 143/99     Pulse Rate 04/07/18 1824 89     Resp 04/07/18 1824 18     Temp 04/07/18 1824 97.7 F (36.5 C)     Temp Source 04/07/18 1824 Oral     SpO2 04/07/18 1824 97 %     Weight 04/07/18 1825 275 lb (124.7 kg)     Height 04/07/18 1825 6\' 2"  (1.88 m)     Head Circumference --      Peak Flow --  Pain Score 04/07/18 1825 7     Pain Loc --      Pain Edu? --      Excl. in [place]? --     Constitutional: Alert and oriented. Well appearing and in mild acute distress. Eyes: Conjunctivae are normal. PERRL. EOMI. Head: Atraumatic. Nose: No congestion/rhinnorhea. Mouth/Throat: Mucous membranes are moist.  Oropharynx non-erythematous. Neck: No stridor.   Cardiovascular: Normal rate, regular rhythm. Grossly normal heart sounds.  Good peripheral circulation. Respiratory: Normal respiratory effort.  No retractions. Lungs CTAB. Gastrointestinal: Soft and moderately diffusely  tender to palpation without rebound or guarding.  Tender to umbilicus.  No distention. No abdominal bruits. No CVA tenderness. Musculoskeletal: No lower extremity tenderness nor edema.  No joint effusions. Neurologic:  Normal speech and language. No gross focal neurologic deficits are appreciated. No gait instability. Skin:  Skin is warm, dry and intact. No rash noted. Psychiatric: Mood and affect are normal. Speech and behavior are normal.  ____________________________________________   LABS (all labs ordered are listed, but only abnormal results are displayed)  Labs Reviewed  CBC - Abnormal; Notable for the following components:      Result Value   WBC 11.1 (*)    All other components within normal limits  URINALYSIS, COMPLETE (UACMP) WITH MICROSCOPIC - Abnormal; Notable for the following components:   Color, Urine YELLOW (*)    APPearance CLEAR (*)    All other components within normal limits  LIPASE, BLOOD  COMPREHENSIVE METABOLIC PANEL  I-STAT CG4 LACTIC ACID, ED  CG4 I-STAT (LACTIC ACID)   ____________________________________________  EKG  None ____________________________________________  RADIOLOGY  ED MD interpretation:  Small umbilical hernia with mild underlying omental inflammation; diverticulosis without diverticulitis  Official radiology report(s): Ct Abdomen Pelvis W Contrast  Result Date: 04/08/2018 CLINICAL DATA:  Acute onset of generalized abdominal pain and nausea. EXAM: CT ABDOMEN AND PELVIS WITH CONTRAST TECHNIQUE: Multidetector CT imaging of the abdomen and pelvis was performed using the standard protocol following bolus administration of intravenous contrast. CONTRAST:  124mL OMNIPAQUE IOHEXOL 300 MG/ML  SOLN COMPARISON:  CTA of the chest, abdomen and pelvis performed 01/14/2016 FINDINGS: Lower chest: The visualized lung bases are grossly clear. The visualized portions of the mediastinum are unremarkable. Hepatobiliary: The liver is unremarkable in  appearance. The gallbladder is unremarkable in appearance. The common bile duct remains normal in caliber. Pancreas: The pancreas is within normal limits. Spleen: The spleen is unremarkable in appearance. Adrenals/Urinary Tract: The adrenal glands are unremarkable in appearance. The kidneys are within normal limits. There is no evidence of hydronephrosis. No renal or ureteral stones are identified. No perinephric stranding is seen. Stomach/Bowel: The stomach is unremarkable in appearance. The small bowel is within normal limits. The patient is status post appendectomy. Scattered diverticulosis is noted along the entirety of the colon, without evidence of diverticulitis. Vascular/Lymphatic: The abdominal aorta is unremarkable in appearance. The inferior vena cava is grossly unremarkable. No retroperitoneal lymphadenopathy is seen. No pelvic sidewall lymphadenopathy is identified. Reproductive: The bladder is mildly distended and grossly unremarkable. A small urachal remnant is incidentally noted. The prostate remains normal in size. Other: A small umbilical hernia is noted, containing fat and fluid, with mild underlying omental inflammation. Musculoskeletal: No acute osseous abnormalities are identified. The visualized musculature is unremarkable in appearance. IMPRESSION: 1. Small umbilical hernia, containing fat and fluid, with mild underlying omental inflammation. No evidence of bowel herniation. Would correlate for associated symptoms. 2. Scattered diverticulosis along the entirety of the colon, without evidence of diverticulitis.  Electronically Signed   By: Garald Balding M.D.   On: 04/08/2018 01:46    ____________________________________________   PROCEDURES  Procedure(s) performed: None  Procedures  Critical Care performed: No  ____________________________________________   INITIAL IMPRESSION / ASSESSMENT AND PLAN / ED COURSE  As part of my medical decision making, I reviewed the following  data within the [place] History obtained from family, Nursing notes reviewed and incorporated, Labs reviewed, Radiograph reviewed and Notes from prior ED visits   38 year old male with a history of diverticulitis and umbilical hernia who presents with periumbilical abdominal pain and nausea. Differential diagnosis includes, but is not limited to, acute appendicitis, renal colic, testicular torsion, urinary tract infection/pyelonephritis, prostatitis,  epididymitis, diverticulitis, small bowel obstruction or ileus, colitis, abdominal aortic aneurysm, gastroenteritis, hernia, etc.  Laboratory and urinalysis results unremarkable.  Will check lactate for concern of incarcerated hernia.  Administer 4 mg IV morphine for pain, paired with 4 mg IV Zofran for nausea and proceed with CT abdomen/pelvis to evaluate for intra-abdominal etiology of patient's pain.   Clinical Course as of Apr 09 235  Daiva Eves Patient feeling better.  Updated patient and spouse on CT results.  He will take NSAIDs at home.  Will prescribe Percocet for pain, Zofran as needed for nausea.  Will refer to surgery for outpatient follow-up.  Strict return precautions given.  Patient and spouse verbalize understanding and agree with plan of care.   [JS]    Clinical Course User Index [JS] Paulette Blanch, MD     ____________________________________________   FINAL CLINICAL IMPRESSION(S) / ED DIAGNOSES  Final diagnoses:  Periumbilical abdominal pain  Nausea  Omental Inflammation   ED Discharge Orders         Ordered    oxyCODONE-acetaminophen (PERCOCET/ROXICET) 5-325 MG tablet  Every 4 hours PRN     04/08/18 0234    ondansetron (ZOFRAN ODT) 4 MG disintegrating tablet  Every 8 hours PRN     04/08/18 0234           Note:  This document was prepared using Dragon voice recognition software and may include unintentional dictation errors.    Paulette Blanch, MD 04/08/18 [phone]

## 2018-04-08 ENCOUNTER — Emergency Department: Payer: Self-pay

## 2018-04-08 ENCOUNTER — Encounter: Payer: Self-pay | Admitting: Radiology

## 2018-04-08 LAB — CG4 I-STAT (LACTIC ACID): Lactic Acid, Venous: 0.85 mmol/L (ref 0.5–1.9)

## 2018-04-08 MED ORDER — IOHEXOL 300 MG/ML  SOLN
125.0000 mL | Freq: Once | INTRAMUSCULAR | Status: AC | PRN
  Administered 2018-04-08: 125 mL via INTRAVENOUS

## 2018-04-08 MED ORDER — IOPAMIDOL (ISOVUE-300) INJECTION 61%
30.0000 mL | Freq: Once | INTRAVENOUS | Status: AC
Start: 2018-04-08 — End: 2018-04-08
  Administered 2018-04-08: 30 mL via ORAL

## 2018-04-08 MED ORDER — OXYCODONE-ACETAMINOPHEN 5-325 MG PO TABS
1.0000 | ORAL_TABLET | Freq: Once | ORAL | Status: AC
  Administered 2018-04-08: 1 via ORAL
  Filled 2018-04-08: qty 1

## 2018-04-08 MED ORDER — KETOROLAC TROMETHAMINE 30 MG/ML IJ SOLN
15.0000 mg | Freq: Once | INTRAMUSCULAR | Status: AC
  Administered 2018-04-08: 15 mg via INTRAVENOUS
  Filled 2018-04-08: qty 1

## 2018-04-08 MED ORDER — OXYCODONE-ACETAMINOPHEN 5-325 MG PO TABS: 1.0000 | ORAL_TABLET | ORAL | 0 refills | Status: DC | PRN

## 2018-04-08 MED ORDER — ONDANSETRON HCL 4 MG/2ML IJ SOLN
4.0000 mg | Freq: Once | INTRAMUSCULAR | Status: AC
  Administered 2018-04-08: 4 mg via INTRAVENOUS
  Filled 2018-04-08: qty 2

## 2018-04-08 MED ORDER — MORPHINE SULFATE (PF) 4 MG/ML IV SOLN
4.0000 mg | Freq: Once | INTRAVENOUS | Status: AC
  Administered 2018-04-08: 4 mg via INTRAVENOUS
  Filled 2018-04-08: qty 1

## 2018-04-08 MED ORDER — ONDANSETRON 4 MG PO TBDP: 4.0000 mg | ORAL_TABLET | Freq: Three times a day (TID) | ORAL | 0 refills | Status: DC | PRN

## 2018-04-08 NOTE — Discharge Instructions (Signed)
1.  Take OTC 800 mg Ibuprofen 3 times daily x5 days. 2.  You may take Percocet and Zofran as needed for pain and nausea (#30). 3.  Return to the ER for worsening symptoms, persistent vomiting, difficulty breathing or other concerns.

## 2018-04-08 NOTE — ED Notes (Signed)
Patient educated about not driving or performing other critical tasks (such as operating heavy machinery, caring for infant/toddler/child) due to sedative nature of narcotic medications received while in the ED.  Pt/caregiver verbalized understanding.   

## 2018-04-11 ENCOUNTER — Ambulatory Visit: Payer: Medicaid Other | Admitting: Anesthesiology

## 2018-04-11 ENCOUNTER — Encounter
Admission: RE | Disposition: A | Discharge status: Home / Self Care | Payer: Self-pay | Source: Ambulatory Visit | Attending: Surgery

## 2018-04-11 ENCOUNTER — Ambulatory Visit: Payer: Self-pay | Admitting: Surgery

## 2018-04-11 ENCOUNTER — Observation Stay
Admission: RE | Admit: 2018-04-11 | Discharge: 2018-04-12 | Disposition: A | Discharge status: Home / Self Care | Payer: Medicaid Other | Source: Ambulatory Visit | Attending: Surgery | Admitting: Surgery

## 2018-04-11 ENCOUNTER — Other Ambulatory Visit: Payer: Self-pay

## 2018-04-11 DIAGNOSIS — F17210 Nicotine dependence, cigarettes, uncomplicated: Secondary | ICD-10-CM | POA: Diagnosis not present

## 2018-04-11 DIAGNOSIS — K436 Other and unspecified ventral hernia with obstruction, without gangrene: Secondary | ICD-10-CM | POA: Diagnosis not present

## 2018-04-11 DIAGNOSIS — G4730 Sleep apnea, unspecified: Secondary | ICD-10-CM | POA: Insufficient documentation

## 2018-04-11 DIAGNOSIS — Z6833 Body mass index (BMI) 33.0-33.9, adult: Secondary | ICD-10-CM | POA: Diagnosis not present

## 2018-04-11 DIAGNOSIS — K420 Umbilical hernia with obstruction, without gangrene: Secondary | ICD-10-CM | POA: Diagnosis present

## 2018-04-11 DIAGNOSIS — E669 Obesity, unspecified: Secondary | ICD-10-CM | POA: Insufficient documentation

## 2018-04-11 HISTORY — PX: UMBILICAL HERNIA REPAIR: SHX196

## 2018-04-11 HISTORY — DX: Anxiety disorder, unspecified: F41.9

## 2018-04-11 HISTORY — DX: Sleep apnea, unspecified: G47.30

## 2018-04-11 HISTORY — DX: Personal history of urinary calculi: Z87.442

## 2018-04-11 HISTORY — DX: Headache: R51

## 2018-04-11 HISTORY — DX: Headache, unspecified: R51.9

## 2018-04-11 LAB — CBC
HCT: 46 % (ref 39.0–52.0)
Hemoglobin: 15.2 g/dL (ref 13.0–17.0)
MCH: 29.2 pg (ref 26.0–34.0)
MCHC: 33 g/dL (ref 30.0–36.0)
MCV: 88.3 fL (ref 80.0–100.0)
Platelets: 212 10*3/uL (ref 150–400)
RBC: 5.21 MIL/uL (ref 4.22–5.81)
RDW: 12.6 % (ref 11.5–15.5)
WBC: 14.4 10*3/uL — ABNORMAL HIGH (ref 4.0–10.5)
nRBC: 0 % (ref 0.0–0.2)

## 2018-04-11 LAB — CREATININE, SERUM
CREATININE: 1.29 mg/dL — AB (ref 0.61–1.24)
GFR calc Af Amer: >60 mL/min (ref >60)
GFR calc non Af Amer: >60 mL/min (ref >60)

## 2018-04-11 SURGERY — REPAIR, HERNIA, UMBILICAL, ADULT
Anesthesia: General | Site: Abdomen

## 2018-04-11 MED ORDER — MIDAZOLAM HCL 2 MG/2ML IJ SOLN
INTRAMUSCULAR | Status: AC
  Filled 2018-04-11: qty 2

## 2018-04-11 MED ORDER — BUPIVACAINE-EPINEPHRINE (PF) 0.5% -1:200000 IJ SOLN
INTRAMUSCULAR | Status: DC | PRN
  Administered 2018-04-11: 5 mL via PERINEURAL

## 2018-04-11 MED ORDER — FENTANYL CITRATE (PF) 100 MCG/2ML IJ SOLN
25.0000 ug | INTRAMUSCULAR | Status: AC | PRN
  Administered 2018-04-11 (×6): 25 ug via INTRAVENOUS

## 2018-04-11 MED ORDER — ACETAMINOPHEN 500 MG PO TABS
1000.0000 mg | ORAL_TABLET | ORAL | Status: AC
  Administered 2018-04-11: 1000 mg via ORAL

## 2018-04-11 MED ORDER — ACETAMINOPHEN 325 MG PO TABS: 650.0000 mg | ORAL_TABLET | Freq: Three times a day (TID) | ORAL | 0 refills | Status: AC | PRN

## 2018-04-11 MED ORDER — CELECOXIB 200 MG PO CAPS
ORAL_CAPSULE | ORAL | Status: AC
  Administered 2018-04-11: 200 mg via ORAL
  Filled 2018-04-11: qty 1

## 2018-04-11 MED ORDER — ROCURONIUM BROMIDE 100 MG/10ML IV SOLN
INTRAVENOUS | Status: DC | PRN
  Administered 2018-04-11: 15 mg via INTRAVENOUS
  Administered 2018-04-11: 10 mg via INTRAVENOUS
  Administered 2018-04-11: 20 mg via INTRAVENOUS
  Administered 2018-04-11: 5 mg via INTRAVENOUS

## 2018-04-11 MED ORDER — FAMOTIDINE 20 MG PO TABS
20.0000 mg | ORAL_TABLET | Freq: Once | ORAL | Status: AC
  Administered 2018-04-11: 20 mg via ORAL

## 2018-04-11 MED ORDER — PROPOFOL 10 MG/ML IV BOLUS
INTRAVENOUS | Status: DC | PRN
  Administered 2018-04-11: 200 mg via INTRAVENOUS

## 2018-04-11 MED ORDER — MEPERIDINE HCL 25 MG/ML IJ SOLN
25.0000 mg | Freq: Once | INTRAMUSCULAR | Status: AC
  Administered 2018-04-11: 25 mg via INTRAVENOUS

## 2018-04-11 MED ORDER — FENTANYL CITRATE (PF) 100 MCG/2ML IJ SOLN
INTRAMUSCULAR | Status: DC | PRN
  Administered 2018-04-11: 100 ug via INTRAVENOUS
  Administered 2018-04-11: 25 ug via INTRAVENOUS
  Administered 2018-04-11: 50 ug via INTRAVENOUS
  Administered 2018-04-11: 25 ug via INTRAVENOUS

## 2018-04-11 MED ORDER — MEPERIDINE HCL 50 MG/ML IJ SOLN
INTRAMUSCULAR | Status: AC
  Filled 2018-04-11: qty 1

## 2018-04-11 MED ORDER — ONDANSETRON HCL 4 MG/2ML IJ SOLN
4.0000 mg | Freq: Once | INTRAMUSCULAR | Status: AC | PRN
  Administered 2018-04-11: 4 mg via INTRAVENOUS

## 2018-04-11 MED ORDER — IBUPROFEN 800 MG PO TABS: 800.0000 mg | ORAL_TABLET | Freq: Three times a day (TID) | ORAL | 0 refills | Status: DC | PRN

## 2018-04-11 MED ORDER — DOCUSATE SODIUM 100 MG PO CAPS: 100.0000 mg | ORAL_CAPSULE | Freq: Two times a day (BID) | ORAL | 0 refills | Status: AC | PRN

## 2018-04-11 MED ORDER — SUGAMMADEX SODIUM 500 MG/5ML IV SOLN
INTRAVENOUS | Status: AC
  Filled 2018-04-11: qty 5

## 2018-04-11 MED ORDER — GABAPENTIN 300 MG PO CAPS
ORAL_CAPSULE | ORAL | Status: AC
  Administered 2018-04-11: 300 mg via ORAL
  Filled 2018-04-11: qty 1

## 2018-04-11 MED ORDER — CELECOXIB 200 MG PO CAPS
200.0000 mg | ORAL_CAPSULE | Freq: Two times a day (BID) | ORAL | Status: DC
  Filled 2018-04-11 (×4): qty 1

## 2018-04-11 MED ORDER — SODIUM CHLORIDE FLUSH 0.9 % IV SOLN
INTRAVENOUS | Status: AC
  Filled 2018-04-11: qty 10

## 2018-04-11 MED ORDER — FENTANYL CITRATE (PF) 100 MCG/2ML IJ SOLN
INTRAMUSCULAR | Status: AC
  Filled 2018-04-11: qty 2

## 2018-04-11 MED ORDER — SUGAMMADEX SODIUM 500 MG/5ML IV SOLN
INTRAVENOUS | Status: DC | PRN
  Administered 2018-04-11: 250 mg via INTRAVENOUS

## 2018-04-11 MED ORDER — ENOXAPARIN SODIUM 40 MG/0.4ML ~~LOC~~ SOLN: 40.0000 mg | SUBCUTANEOUS | Status: DC

## 2018-04-11 MED ORDER — CEFAZOLIN SODIUM-DEXTROSE 2-4 GM/100ML-% IV SOLN
INTRAVENOUS | Status: AC
  Filled 2018-04-11: qty 100

## 2018-04-11 MED ORDER — GABAPENTIN 300 MG PO CAPS
300.0000 mg | ORAL_CAPSULE | ORAL | Status: AC
  Administered 2018-04-11: 300 mg via ORAL

## 2018-04-11 MED ORDER — LIDOCAINE HCL (CARDIAC) PF 100 MG/5ML IV SOSY
PREFILLED_SYRINGE | INTRAVENOUS | Status: DC | PRN
  Administered 2018-04-11: 100 mg via INTRAVENOUS

## 2018-04-11 MED ORDER — PROPOFOL 10 MG/ML IV BOLUS
INTRAVENOUS | Status: AC
  Filled 2018-04-11: qty 20

## 2018-04-11 MED ORDER — DEXAMETHASONE SODIUM PHOSPHATE 10 MG/ML IJ SOLN
INTRAMUSCULAR | Status: DC | PRN
  Administered 2018-04-11: 10 mg via INTRAVENOUS

## 2018-04-11 MED ORDER — ONDANSETRON 4 MG PO TBDP: 4.0000 mg | ORAL_TABLET | Freq: Four times a day (QID) | ORAL | Status: DC | PRN

## 2018-04-11 MED ORDER — MIDAZOLAM HCL 2 MG/2ML IJ SOLN
INTRAMUSCULAR | Status: DC | PRN
  Administered 2018-04-11: 2 mg via INTRAVENOUS

## 2018-04-11 MED ORDER — ACETAMINOPHEN 325 MG PO TABS: 650.0000 mg | ORAL_TABLET | Freq: Four times a day (QID) | ORAL | Status: DC | PRN

## 2018-04-11 MED ORDER — ONDANSETRON HCL 4 MG/2ML IJ SOLN: 4.0000 mg | Freq: Four times a day (QID) | INTRAMUSCULAR | Status: DC | PRN

## 2018-04-11 MED ORDER — LACTATED RINGERS IV SOLN
INTRAVENOUS | Status: DC
  Administered 2018-04-11 (×2): via INTRAVENOUS

## 2018-04-11 MED ORDER — CELECOXIB 200 MG PO CAPS
200.0000 mg | ORAL_CAPSULE | ORAL | Status: AC
  Administered 2018-04-11: 200 mg via ORAL

## 2018-04-11 MED ORDER — GABAPENTIN 300 MG PO CAPS
300.0000 mg | ORAL_CAPSULE | Freq: Two times a day (BID) | ORAL | Status: DC
  Administered 2018-04-12: 300 mg via ORAL
  Filled 2018-04-11: qty 1

## 2018-04-11 MED ORDER — DEXAMETHASONE SODIUM PHOSPHATE 10 MG/ML IJ SOLN
INTRAMUSCULAR | Status: AC
  Filled 2018-04-11: qty 1

## 2018-04-11 MED ORDER — ONDANSETRON HCL 4 MG/2ML IJ SOLN
INTRAMUSCULAR | Status: AC
  Administered 2018-04-11: 4 mg via INTRAVENOUS
  Filled 2018-04-11: qty 2

## 2018-04-11 MED ORDER — ACETAMINOPHEN 650 MG RE SUPP: 650.0000 mg | Freq: Four times a day (QID) | RECTAL | Status: DC | PRN

## 2018-04-11 MED ORDER — PHENYLEPHRINE HCL 10 MG/ML IJ SOLN
INTRAMUSCULAR | Status: DC | PRN
  Administered 2018-04-11 (×2): 100 ug via INTRAVENOUS

## 2018-04-11 MED ORDER — OXYCODONE-ACETAMINOPHEN 5-325 MG PO TABS
1.0000 | ORAL_TABLET | ORAL | Status: DC | PRN
  Administered 2018-04-12: 1 via ORAL
  Filled 2018-04-11: qty 1

## 2018-04-11 MED ORDER — BUPIVACAINE LIPOSOME 1.3 % IJ SUSP
INTRAMUSCULAR | Status: DC | PRN
  Administered 2018-04-11: 20 mL

## 2018-04-11 MED ORDER — FENTANYL CITRATE (PF) 100 MCG/2ML IJ SOLN
INTRAMUSCULAR | Status: AC
  Administered 2018-04-11: 25 ug via INTRAVENOUS
  Filled 2018-04-11: qty 2

## 2018-04-11 MED ORDER — ONDANSETRON HCL 4 MG/2ML IJ SOLN
INTRAMUSCULAR | Status: DC | PRN
  Administered 2018-04-11: 4 mg via INTRAVENOUS

## 2018-04-11 MED ORDER — SUCCINYLCHOLINE CHLORIDE 20 MG/ML IJ SOLN
INTRAMUSCULAR | Status: DC | PRN
  Administered 2018-04-11: 120 mg via INTRAVENOUS

## 2018-04-11 MED ORDER — ONDANSETRON HCL 4 MG/2ML IJ SOLN
INTRAMUSCULAR | Status: AC
  Filled 2018-04-11: qty 2

## 2018-04-11 MED ORDER — ACETAMINOPHEN 500 MG PO TABS
ORAL_TABLET | ORAL | Status: AC
  Administered 2018-04-11: 1000 mg via ORAL
  Filled 2018-04-11: qty 2

## 2018-04-11 MED ORDER — CEFAZOLIN SODIUM-DEXTROSE 2-4 GM/100ML-% IV SOLN
2.0000 g | INTRAVENOUS | Status: AC
  Administered 2018-04-11: 2 g via INTRAVENOUS

## 2018-04-11 MED ORDER — FAMOTIDINE 20 MG PO TABS
ORAL_TABLET | ORAL | Status: AC
  Administered 2018-04-11: 20 mg via ORAL
  Filled 2018-04-11: qty 1

## 2018-04-11 MED ORDER — PROMETHAZINE HCL 25 MG/ML IJ SOLN
INTRAMUSCULAR | Status: AC
  Administered 2018-04-11: 6.25 mg via INTRAVENOUS
  Filled 2018-04-11: qty 1

## 2018-04-11 MED ORDER — CHLORHEXIDINE GLUCONATE CLOTH 2 % EX PADS: 6.0000 | MEDICATED_PAD | Freq: Once | CUTANEOUS | Status: DC

## 2018-04-11 MED ORDER — PROMETHAZINE HCL 25 MG/ML IJ SOLN
6.2500 mg | Freq: Once | INTRAMUSCULAR | Status: AC
  Administered 2018-04-11: 6.25 mg via INTRAVENOUS

## 2018-04-11 SURGICAL SUPPLY — 37 items
ADH SKN CLS APL DERMABOND .7 (GAUZE/BANDAGES/DRESSINGS) ×1
BLADE SURG 15 STRL LF DISP TIS (BLADE) ×1 IMPLANT
BLADE SURG 15 STRL SS (BLADE) ×4
CANISTER SUCT 1200ML W/VALVE (MISCELLANEOUS) ×2 IMPLANT
CHLORAPREP W/TINT 26ML (MISCELLANEOUS) ×2 IMPLANT
COVER WAND RF STERILE (DRAPES) ×2 IMPLANT
DERMABOND ADVANCED (GAUZE/BANDAGES/DRESSINGS) ×1
DERMABOND ADVANCED .7 DNX12 (GAUZE/BANDAGES/DRESSINGS) ×1 IMPLANT
DRAPE LAPAROTOMY 77X122 PED (DRAPES) ×2 IMPLANT
DRSG OPSITE POSTOP 3X4 (GAUZE/BANDAGES/DRESSINGS) ×1 IMPLANT
ELECT CAUTERY BLADE 6.4 (BLADE) ×1 IMPLANT
ELECT REM PT RETURN 9FT ADLT (ELECTROSURGICAL) ×2
ELECTRODE REM PT RTRN 9FT ADLT (ELECTROSURGICAL) ×1 IMPLANT
GLOVE BIOGEL PI IND STRL 7.0 (GLOVE) ×1 IMPLANT
GLOVE BIOGEL PI INDICATOR 7.0 (GLOVE) ×1
GLOVE SURG SYN 7.0 (GLOVE) ×2 IMPLANT
GLOVE SURG SYN 7.0 PF PI (GLOVE) ×1 IMPLANT
GOWN STRL REUS W/ TWL LRG LVL3 (GOWN DISPOSABLE) ×3 IMPLANT
GOWN STRL REUS W/TWL LRG LVL3 (GOWN DISPOSABLE) ×6
KIT TURNOVER KIT A (KITS) ×2 IMPLANT
LABEL OR SOLS (LABEL) ×2 IMPLANT
MESH VENTRALEX ST 8CM LRG (Mesh General) ×1 IMPLANT
NEEDLE HYPO 22GX1.5 SAFETY (NEEDLE) ×2 IMPLANT
NS IRRIG 500ML POUR BTL (IV SOLUTION) ×2 IMPLANT
PACK BASIN MINOR ARMC (MISCELLANEOUS) ×2 IMPLANT
STAPLER SKIN PROX 35W (STAPLE) ×1 IMPLANT
SUT ETHIBOND NAB MO 7 #0 18IN (SUTURE) ×3 IMPLANT
SUT MNCRL 4-0 (SUTURE) ×2
SUT MNCRL 4-0 27XMFL (SUTURE) ×1
SUT VIC AB 2-0 SH 27 (SUTURE) ×2
SUT VIC AB 2-0 SH 27XBRD (SUTURE) ×1 IMPLANT
SUT VIC AB 3-0 SH 27 (SUTURE) ×2
SUT VIC AB 3-0 SH 27X BRD (SUTURE) ×1 IMPLANT
SUTURE MNCRL 4-0 27XMF (SUTURE) ×1 IMPLANT
SYR 10ML LL (SYRINGE) ×4 IMPLANT
TOWEL OR 17X26 4PK STRL BLUE (TOWEL DISPOSABLE) ×2 IMPLANT
WATER STERILE IRR 1000ML POUR (IV SOLUTION) ×2 IMPLANT

## 2018-04-11 NOTE — Anesthesia Post-op Follow-up Note (Signed)
Anesthesia QCDR form completed.        

## 2018-04-11 NOTE — Transfer of Care (Signed)
Immediate Anesthesia Transfer of Care Note  Patient: OLLY SHINER  Procedure(s) Performed: HERNIA REPAIR UMBILICAL WITH MESH (N/A Abdomen)  Patient Location: PACU  Anesthesia Type:General  Level of Consciousness: sedated  Airway & Oxygen Therapy: Patient Spontanous Breathing and Patient connected to face mask oxygen  Post-op Assessment: Report given to RN and Post -op Vital signs reviewed and stable  Post vital signs: Reviewed and stable  Last Vitals:  Vitals Value Taken Time  BP 118/70 04/11/2018  7:28 PM  Temp 36.2 C 04/11/2018  7:27 PM  Pulse 66 04/11/2018  7:29 PM  Resp 14 04/11/2018  7:29 PM  SpO2 92 % 04/11/2018  7:29 PM  Vitals shown include unvalidated device data.  Last Pain:  Vitals:   04/11/18 1927  TempSrc:   PainSc: Asleep         Complications: No apparent anesthesia complications

## 2018-04-11 NOTE — Anesthesia Postprocedure Evaluation (Signed)
Anesthesia Post Note  Patient: Brad Mcfarland  Procedure(s) Performed: HERNIA REPAIR UMBILICAL WITH MESH (N/A Abdomen)  Patient location during evaluation: PACU Anesthesia Type: General Level of consciousness: awake and alert and oriented Pain management: pain level controlled Vital Signs Assessment: post-procedure vital signs reviewed and stable Respiratory status: spontaneous breathing Cardiovascular status: blood pressure returned to baseline Anesthetic complications: no     Last Vitals:  Vitals:   04/11/18 2145 04/11/18 2157  BP: 114/85 121/78  Pulse: 65 65  Resp: 15 12  Temp:    SpO2: 95% 96%    Last Pain:  Vitals:   04/11/18 2157  TempSrc:   PainSc: 5                  Colletta Spillers

## 2018-04-11 NOTE — OR Nursing (Addendum)
Dr Amie Critchley reviewed ECG from 2017, no new orders.  Asked Dr Kayleen Memos  to listen to lungs based on surgeon's documentation of lung sounds on H&P.

## 2018-04-11 NOTE — Anesthesia Procedure Notes (Signed)
Procedure Name: Intubation Date/Time: 04/11/2018 5:40 PM Performed by: Hedda Slade, CRNA Pre-anesthesia Checklist: Patient identified, Patient being monitored, Timeout performed, Emergency Drugs available and Suction available Patient Re-evaluated:Patient Re-evaluated prior to induction Oxygen Delivery Method: Circle system utilized Preoxygenation: Pre-oxygenation with 100% oxygen Induction Type: IV induction Ventilation: Mask ventilation without difficulty Laryngoscope Size: Mac and 4 Grade View: Grade II Tube type: Oral Tube size: 7.0 mm Number of attempts: 1 Airway Equipment and Method: Stylet Placement Confirmation: ETT inserted through vocal cords under direct vision,  positive ETCO2 and breath sounds checked- equal and bilateral Secured at: 22 cm Tube secured with: Tape Dental Injury: Teeth and Oropharynx as per pre-operative assessment

## 2018-04-11 NOTE — Discharge Instructions (Signed)

## 2018-04-11 NOTE — Progress Notes (Signed)
Pt waiting in pre-op bay 13 for surgery. Handoff report given to Myles Lipps, RN

## 2018-04-11 NOTE — H&P (Signed)
Subjective:   CC: umbilical hernia  HPI:  Brad Mcfarland is a 38 y.o. male who was referred by [facility] for evaluation of above. Patient states he has had this umbilical hernia for several years now with intermittent episodes of discomfort.  However an acute worsening of this pain happened a few days ago which made him go to the ED.  At that time the CT scan showed some inflammation of periumbilical fat within the hernia.  Per report patient symptoms improved so therefore was discharged and asked to follow-up on an outpatient basis.  Today the focal pain on the umbilicus remains as bad as it was in the emergency department, despite use of narcotics Tylenol and Advil.  He states the pain is slowly radiating beyond the hernia site as well.  Hernia itself is never been reducible.   Associated with some nausea, exacerbated by movement.   Patient has no symptoms of  chronic constipation.    Past Medical History:  has a past medical history of Migraine, Nephrolithiasis, Obesity, Panic attacks, and Tobacco use.  Past Surgical History:       Past Surgical History:  Procedure Laterality Date  . APPENDECTOMY    . wisdom tooth removal      Family History: family history includes Asthma in his mother; Ovarian cancer in his mother.  Social History:  reports that he has been smoking. He has been smoking about 1.50 packs per day. He has never used smokeless tobacco. He reports that he does not drink alcohol or use drugs.  Current Medications: has a current medication list which includes the following prescription(s): acetaminophen, hydrocodone-acetaminophen, and ibuprofen.  Allergies:       Allergies as of 04/11/2018 - Reviewed 04/11/2018  Allergen Reaction Noted  . Bismuth Nausea And Vomiting 08/20/2014  . Pepto-bismol [bismuth subsalicylate] Vomiting 09/32/6712    ROS:  A 15 point review of systems was performed and pertinent positives and negatives noted in HPI   Objective:   BP  136/87   Pulse 83   Temp 36.2 C (97.1 F) (Oral)   Ht 188 cm (6\' 2" )   Wt (!) 123.4 kg (272 lb)   BMI 34.92 kg/m   Constitutional :  alert, appears stated age, cooperative and no distress  Lymphatics/Throat:  no asymmetry, masses, or scars  Respiratory:  no wheezing, rhonchi  Cardiovascular:  regular rate and rhythm, S1, S2 normal, no murmur, click, rub or gallop and regular rate and rhythm  Gastrointestinal: soft, but focal guarding even with sligh palpation of incarcerated umbilicus without any overlying skin changes.  Musculoskeletal: Steady gait and movement  Skin: Cool and moist.  Psychiatric: Normal affect, non-agitated, not confused       LABS:  n/a   RADS: CLINICAL DATA: Acute onset of generalized abdominal pain and nausea.  EXAM: CT ABDOMEN AND PELVIS WITH CONTRAST  TECHNIQUE: Multidetector CT imaging of the abdomen and pelvis was performed using the standard protocol following bolus administration of intravenous contrast.  CONTRAST: 143mL OMNIPAQUE IOHEXOL 300 MG/ML SOLN  COMPARISON: CTA of the chest, abdomen and pelvis performed 01/14/2016  FINDINGS: Lower chest: The visualized lung bases are grossly clear. The visualized portions of the mediastinum are unremarkable.  Hepatobiliary: The liver is unremarkable in appearance. The gallbladder is unremarkable in appearance. The common bile duct remains normal in caliber.  Pancreas: The pancreas is within normal limits.  Spleen: The spleen is unremarkable in appearance.  Adrenals/Urinary Tract: The adrenal glands are unremarkable in appearance. The kidneys  are within normal limits. There is no evidence of hydronephrosis. No renal or ureteral stones are identified. No perinephric stranding is seen.  Stomach/Bowel: The stomach is unremarkable in appearance. The small bowel is within normal limits. The patient is status post appendectomy.  Scattered diverticulosis is noted along the  entirety of the colon, without evidence of diverticulitis.  Vascular/Lymphatic: The abdominal aorta is unremarkable in appearance. The inferior vena cava is grossly unremarkable. No retroperitoneal lymphadenopathy is seen. No pelvic sidewall lymphadenopathy is identified.  Reproductive: The bladder is mildly distended and grossly unremarkable. A small urachal remnant is incidentally noted. The prostate remains normal in size.  Other: A small umbilical hernia is noted, containing fat and fluid, with mild underlying omental inflammation.  Musculoskeletal: No acute osseous abnormalities are identified. The visualized musculature is unremarkable in appearance.  IMPRESSION: 1. Small umbilical hernia, containing fat and fluid, with mild underlying omental inflammation. No evidence of bowel herniation. Would correlate for associated symptoms. 2. Scattered diverticulosis along the entirety of the colon, without evidence of diverticulitis.   Electronically Signed By: Garald Balding M.D. On: 04/08/2018 01:46  Assessment:       Incarcerated umbilical hernia [R42.7]  Plan:   1. Incarcerated umbilical hernia [C62.3]   Discussed the risk of surgery including recurrence, which can be up to 50% in the case of incisional or complex hernias, possible use of prosthetic materials (mesh) and the increased risk of mesh infxn if used, bleeding, chronic pain, post-op infxn, post-op SBO or ileus, and possible re-operation to address said risks. The risks of general anesthetic, if used, includes MI, CVA, sudden death or even reaction to anesthetic medications also discussed. Alternatives include continued observation.  Benefits include possible symptom relief, prevention of incarceration, strangulation, enlargement in size over time, and the risk of emergency surgery in the face of strangulation.   Typical post-op recovery time of 3-5 days with 4-6 weeks of activity restrictions were  also discussed.  ED return precautions given for sudden increase in pain, size of hernia with accompanying fever, nausea, and/or vomiting.  The patient verbalized understanding and all questions were answered to the patient's satisfaction.   2. Patient has elected to proceed with surgical treatment. Procedure will be scheduled.  Written consent was obtained. Due to persistent pain, will proceed with repair today

## 2018-04-11 NOTE — Anesthesia Preprocedure Evaluation (Signed)
Anesthesia Evaluation  Patient identified by MRN, date of birth, ID band Patient awake    Reviewed: Allergy & Precautions, NPO status , Patient's Chart, lab work & pertinent test results  Airway Mallampati: II  TM Distance: >3 FB     Dental   Pulmonary sleep apnea , Current Smoker,    Pulmonary exam normal        Cardiovascular negative cardio ROS Normal cardiovascular exam     Neuro/Psych  Headaches, Anxiety    GI/Hepatic Neg liver ROS,   Endo/Other  negative endocrine ROS  Renal/GU stones  negative genitourinary   Musculoskeletal negative musculoskeletal ROS (+)   Abdominal   Peds negative pediatric ROS (+)  Hematology negative hematology ROS (+)   Anesthesia Other Findings Past Medical History: No date: Anxiety     Comment:  hx of panic attacks No date: Headache     Comment:  hx of migraines No date: History of kidney stones No date: Kidney stones No date: Sleep apnea  Reproductive/Obstetrics                             Anesthesia Physical Anesthesia Plan  ASA: II and emergent  Anesthesia Plan: General   Post-op Pain Management:    Induction: Intravenous, Rapid sequence and Cricoid pressure planned  PONV Risk Score and Plan:   Airway Management Planned: Oral ETT  Additional Equipment:   Intra-op Plan:   Post-operative Plan: Extubation in OR  Informed Consent: I have reviewed the patients History and Physical, chart, labs and discussed the procedure including the risks, benefits and alternatives for the proposed anesthesia with the patient or authorized representative who has indicated his/her understanding and acceptance.   Dental advisory given  Plan Discussed with: CRNA and Surgeon  Anesthesia Plan Comments:         Anesthesia Quick Evaluation

## 2018-04-11 NOTE — H&P (View-Only) (Signed)
Subjective:   CC: umbilical hernia  HPI:  Brad Mcfarland is a 38 y.o. male who was referred by [facility] for evaluation of above. Patient states he has had this umbilical hernia for several years now with intermittent episodes of discomfort.  However an acute worsening of this pain happened a few days ago which made him go to the ED.  At that time the CT scan showed some inflammation of periumbilical fat within the hernia.  Per report patient symptoms improved so therefore was discharged and asked to follow-up on an outpatient basis.  Today the focal pain on the umbilicus remains as bad as it was in the emergency department, despite use of narcotics Tylenol and Advil.  He states the pain is slowly radiating beyond the hernia site as well.  Hernia itself is never been reducible.   Associated with some nausea, exacerbated by movement.   Patient has no symptoms of  chronic constipation.    Past Medical History:  has a past medical history of Migraine, Nephrolithiasis, Obesity, Panic attacks, and Tobacco use.  Past Surgical History:       Past Surgical History:  Procedure Laterality Date  . APPENDECTOMY    . wisdom tooth removal      Family History: family history includes Asthma in his mother; Ovarian cancer in his mother.  Social History:  reports that he has been smoking. He has been smoking about 1.50 packs per day. He has never used smokeless tobacco. He reports that he does not drink alcohol or use drugs.  Current Medications: has a current medication list which includes the following prescription(s): acetaminophen, hydrocodone-acetaminophen, and ibuprofen.  Allergies:       Allergies as of 04/11/2018 - Reviewed 04/11/2018  Allergen Reaction Noted  . Bismuth Nausea And Vomiting 08/20/2014  . Pepto-bismol [bismuth subsalicylate] Vomiting 13/24/4010    ROS:  A 15 point review of systems was performed and pertinent positives and negatives noted in HPI   Objective:   BP  136/87   Pulse 83   Temp 36.2 C (97.1 F) (Oral)   Ht 188 cm (6\' 2" )   Wt (!) 123.4 kg (272 lb)   BMI 34.92 kg/m   Constitutional :  alert, appears stated age, cooperative and no distress  Lymphatics/Throat:  no asymmetry, masses, or scars  Respiratory:  no wheezing, rhonchi  Cardiovascular:  regular rate and rhythm, S1, S2 normal, no murmur, click, rub or gallop and regular rate and rhythm  Gastrointestinal: soft, but focal guarding even with sligh palpation of incarcerated umbilicus without any overlying skin changes.  Musculoskeletal: Steady gait and movement  Skin: Cool and moist.  Psychiatric: Normal affect, non-agitated, not confused       LABS:  n/a   RADS: CLINICAL DATA: Acute onset of generalized abdominal pain and nausea.  EXAM: CT ABDOMEN AND PELVIS WITH CONTRAST  TECHNIQUE: Multidetector CT imaging of the abdomen and pelvis was performed using the standard protocol following bolus administration of intravenous contrast.  CONTRAST: 131mL OMNIPAQUE IOHEXOL 300 MG/ML SOLN  COMPARISON: CTA of the chest, abdomen and pelvis performed 01/14/2016  FINDINGS: Lower chest: The visualized lung bases are grossly clear. The visualized portions of the mediastinum are unremarkable.  Hepatobiliary: The liver is unremarkable in appearance. The gallbladder is unremarkable in appearance. The common bile duct remains normal in caliber.  Pancreas: The pancreas is within normal limits.  Spleen: The spleen is unremarkable in appearance.  Adrenals/Urinary Tract: The adrenal glands are unremarkable in appearance. The kidneys  are within normal limits. There is no evidence of hydronephrosis. No renal or ureteral stones are identified. No perinephric stranding is seen.  Stomach/Bowel: The stomach is unremarkable in appearance. The small bowel is within normal limits. The patient is status post appendectomy.  Scattered diverticulosis is noted along the  entirety of the colon, without evidence of diverticulitis.  Vascular/Lymphatic: The abdominal aorta is unremarkable in appearance. The inferior vena cava is grossly unremarkable. No retroperitoneal lymphadenopathy is seen. No pelvic sidewall lymphadenopathy is identified.  Reproductive: The bladder is mildly distended and grossly unremarkable. A small urachal remnant is incidentally noted. The prostate remains normal in size.  Other: A small umbilical hernia is noted, containing fat and fluid, with mild underlying omental inflammation.  Musculoskeletal: No acute osseous abnormalities are identified. The visualized musculature is unremarkable in appearance.  IMPRESSION: 1. Small umbilical hernia, containing fat and fluid, with mild underlying omental inflammation. No evidence of bowel herniation. Would correlate for associated symptoms. 2. Scattered diverticulosis along the entirety of the colon, without evidence of diverticulitis.   Electronically Signed By: Garald Balding M.D. On: 04/08/2018 01:46  Assessment:       Incarcerated umbilical hernia [Q76.1]  Plan:   1. Incarcerated umbilical hernia [P50.9]   Discussed the risk of surgery including recurrence, which can be up to 50% in the case of incisional or complex hernias, possible use of prosthetic materials (mesh) and the increased risk of mesh infxn if used, bleeding, chronic pain, post-op infxn, post-op SBO or ileus, and possible re-operation to address said risks. The risks of general anesthetic, if used, includes MI, CVA, sudden death or even reaction to anesthetic medications also discussed. Alternatives include continued observation.  Benefits include possible symptom relief, prevention of incarceration, strangulation, enlargement in size over time, and the risk of emergency surgery in the face of strangulation.   Typical post-op recovery time of 3-5 days with 4-6 weeks of activity restrictions were  also discussed.  ED return precautions given for sudden increase in pain, size of hernia with accompanying fever, nausea, and/or vomiting.  The patient verbalized understanding and all questions were answered to the patient's satisfaction.   2. Patient has elected to proceed with surgical treatment. Procedure will be scheduled.  Written consent was obtained. Due to persistent pain, will proceed with repair today

## 2018-04-11 NOTE — Interval H&P Note (Signed)
History and Physical Interval Note:  04/11/2018 5:04 PM  Brad Mcfarland  has presented today for surgery, with the diagnosis of INCARCERATED UMBILICAL HERNIA  The various methods of treatment have been discussed with the patient and family. After consideration of risks, benefits and other options for treatment, the patient has consented to  Procedure(s): HERNIA REPAIR UMBILICAL ADULT-INCARCERATED POSSIBLE SALVAGE (N/A) as a surgical intervention .  The patient's history has been reviewed, patient examined, no change in status, stable for surgery.  I have reviewed the patient's chart and labs.  Questions were answered to the patient's satisfaction.     Nicola Heinemann Lysle Pearl

## 2018-04-12 ENCOUNTER — Encounter: Payer: Self-pay | Admitting: Surgery

## 2018-04-12 DIAGNOSIS — K436 Other and unspecified ventral hernia with obstruction, without gangrene: Secondary | ICD-10-CM | POA: Diagnosis not present

## 2018-04-12 MED ORDER — CELECOXIB 200 MG PO CAPS: 200.0000 mg | ORAL_CAPSULE | Freq: Two times a day (BID) | ORAL | 0 refills | Status: AC

## 2018-04-12 NOTE — Op Note (Signed)
Preoperative diagnosis: umbilical hernia, incarcerated Postoperative diagnosis: ventral hernia, incarcerated  Procedure:  Open ventral hernia repair with mesh  Anesthesia: LMA  Surgeon: Benjamine Sprague  Wound Classification: Clean  Specimen: none  Complications: None  Estimated Blood Loss: minimal  Indications:see HPI  Findings: 1. 4cm x 4cm incarcerated ventral hernia 4. Tension free repair achieved with mesh and suture 5. Adequate hemostasis  Description of procedure: The patient was brought to the operating room and general anesthesia was induced. A time-out was completed verifying correct patient, procedure, site, positioning, and implant(s) and/or special equipment prior to beginning this procedure. Antibiotics were administered prior to making the incision. SCDs placed. The anterior abdominal wall was prepped and draped in the standard sterile fashion.   An incision was made after infusing the preplanned incision with half percent Marcaine.  Dissection carried down to fascia where there was noted to be a small incarcerated ventral hernia.  The hernia sac was dissected from the surrounding structure and attempt was made to reduce the sac, but was unsuccessful due to dense adhesions at the hernia defect site.  The defect was extended until the sac was completely free of its attachments.  At that point the sac was entered and there was noted to be viable omentum within it.  The omentum was reduced and excess hernia sac was suture-ligated using 3-0 Vicryl.  Hemostasis was confirmed prior to reducing the sac remnant. Due to the now open defect measuring approximately 4 cm across, decision was made to proceed with a mesh repair.    A 8cm umbilical mesh was placed within the preperitoneal cavity through the present defect and secured in place on the abdominal wall using interrupted 0 Ethibond sutures.  Once the mesh was noted to be laying flat and secured to the abdominal wall the defect  itself was primary closed using 0 Ethibond in a simple interrupted fashion.  The fascia as well as the skin incision was then infused with 20 mL's of Exparel.  After confirming hemostasis,the wound was irrigated and closed in a multilayer fashion, using 3-0 Vicryl for the deep dermal layer in an interrupted fashion and running 4-0 Monocryl in a subcuticular fashion.  Wound was then dressed with staples and dressed.  Patient was then successfully awakened and transferred to PACU in stable condition.  At the end of the procedure sponge and instrument counts were correct

## 2018-04-12 NOTE — Progress Notes (Signed)
Patient discharge teaching given, including activity, diet, follow-up appoints, and medications. Patient verbalized understanding of all discharge instructions. IV access was d/c'd. Vitals are stable. Skin is intact except as charted in most recent assessments. Pt to be escorted out, to be driven home by family.  Brad Mcfarland [employer]

## 2018-04-12 NOTE — Care Management (Signed)
Patient discharged prior to [facility] assessment.  Patient listed as self pay.  PCP listed as [place], which is also reflected in care everywhere

## 2018-04-13 LAB — SURGICAL PATHOLOGY

## 2018-04-13 NOTE — Discharge Summary (Signed)
Physician Discharge Summary  Patient ID: Brad Mcfarland MRN: [id number] DOB/AGE: [id number] y.o.  Admit date: 04/11/2018 Discharge date: 04/13/2018  Admission Diagnoses: incarcerated umbilical hernia  Discharge Diagnoses:  Incarcerated ventral hernia  Discharged Condition: good  Hospital Course: presented to clinic for incarcerated hernia, taken urgently to OR.  See OR note for details.  Recovered well afterwards in obs, deemed stable for discharge with pain under control with oral meds, tolerating diet.  Consults: None  Discharge Exam: Blood pressure 122/84, pulse 77, temperature (!) 97.5 F (36.4 C), temperature source Oral, resp. rate 16, height 6\' 2"  (1.88 m), weight 119 kg, SpO2 92 %. General appearance: alert, cooperative, appears stated age and no distress GI: soft, non-tender; bowel sounds normal; no masses,  no organomegaly Incision/Wound: c/d/i  Disposition:  Discharge disposition: 01-Home or Self Care       Discharge Instructions    Discharge patient   Complete by:  As directed    Discharge disposition:  01-Home or Self Care   Discharge patient date:  04/11/2018     Allergies as of 04/12/2018      Reactions   Pepto-bismol [bismuth] Nausea And Vomiting      Medication List    STOP taking these medications   HYDROcodone-acetaminophen 5-325 MG tablet Commonly known as:  NORCO   meloxicam 15 MG tablet Commonly known as:  MOBIC   ondansetron 4 MG disintegrating tablet Commonly known as:  ZOFRAN ODT   predniSONE 10 MG tablet Commonly known as:  DELTASONE   sulfamethoxazole-trimethoprim 800-160 MG tablet Commonly known as:  BACTRIM DS,SEPTRA DS     TAKE these medications   acetaminophen 325 MG tablet Commonly known as:  TYLENOL Take 2 tablets (650 mg total) by mouth every 8 (eight) hours as needed for mild pain.   celecoxib 200 MG capsule Commonly known as:  CELEBREX Take 1 capsule (200 mg total) by mouth 2 (two) times daily for 10 days.    docusate sodium 100 MG capsule Commonly known as:  COLACE Take 1 capsule (100 mg total) by mouth 2 (two) times daily as needed for up to 10 days for mild constipation.   oxyCODONE-acetaminophen 5-325 MG tablet Commonly known as:  PERCOCET/ROXICET Take 1 tablet by mouth every 4 (four) hours as needed for severe pain.      Follow-up Information    Grandin, Ezra Denne, DO. Go on 04/20/2018.   Specialty:  Surgery Why:  @10am   For staple removal Contact information: [address] [place] [address] [phone]            Total time spent arranging discharge was >87min. Signed: Benjamine Sprague 04/13/2018, 8:41 AM

## 2018-12-07 ENCOUNTER — Other Ambulatory Visit: Payer: Self-pay

## 2018-12-07 DIAGNOSIS — Z021 Encounter for pre-employment examination: Secondary | ICD-10-CM

## 2018-12-07 DIAGNOSIS — K420 Umbilical hernia with obstruction, without gangrene: Secondary | ICD-10-CM

## 2018-12-07 NOTE — Progress Notes (Signed)
Pre-employment Drug Screen collected using [employer] form on Account [phone]. Requisition # [id number]  AMD

## 2019-01-09 ENCOUNTER — Emergency Department: Payer: Self-pay

## 2019-01-09 ENCOUNTER — Emergency Department
Admission: EM | Admit: 2019-01-09 | Discharge: 2019-01-09 | Disposition: A | Discharge status: Home / Self Care | Payer: Self-pay | Attending: Emergency Medicine | Admitting: Emergency Medicine

## 2019-01-09 ENCOUNTER — Encounter: Payer: Self-pay | Admitting: Emergency Medicine

## 2019-01-09 ENCOUNTER — Other Ambulatory Visit: Payer: Self-pay

## 2019-01-09 DIAGNOSIS — M25562 Pain in left knee: Secondary | ICD-10-CM | POA: Insufficient documentation

## 2019-01-09 DIAGNOSIS — G8929 Other chronic pain: Secondary | ICD-10-CM

## 2019-01-09 DIAGNOSIS — F17210 Nicotine dependence, cigarettes, uncomplicated: Secondary | ICD-10-CM | POA: Insufficient documentation

## 2019-01-09 DIAGNOSIS — M5442 Lumbago with sciatica, left side: Secondary | ICD-10-CM | POA: Insufficient documentation

## 2019-01-09 MED ORDER — PREDNISONE 10 MG PO TABS: ORAL_TABLET | ORAL | 0 refills | Status: DC

## 2019-01-09 MED ORDER — TRAMADOL HCL 50 MG PO TABS: 50.0000 mg | ORAL_TABLET | Freq: Three times a day (TID) | ORAL | 0 refills | Status: DC | PRN

## 2019-01-09 NOTE — ED Notes (Signed)
This RN went to discharge pt, pt responded with " do you not ask the depression questions now since covid" This RN asked pt about mental states and if he needed to be seen by a psychiatrist today. Pt refused, states " no i'm leaving now but I am depressed because of the pain i'm in" this RN asked pt if he had thoughts of hurting himself, pt denied but state I am depressed. This RN encouraged pt multiple times to stay and see psychiatrist if pt felt depressed and wanted help. Pt refuses and states " I'm good, I'm leaving now"

## 2019-01-09 NOTE — ED Provider Notes (Signed)
[facility] Emergency Department Provider Note  ____________________________________________   First MD Initiated Contact with Patient 01/09/19 [phone]     (approximate)  I have reviewed the triage vital signs and the nursing notes.   HISTORY  Chief Complaint Back Pain and Knee Pain   HPI Brad Mcfarland is a 38 y.o. male presents to the ED with complaint of exacerbation of his chronic left lower back pain radiating down into his left thigh.  He also has history of chronic left knee pain but states that there is an area on the medial aspect that concerns him as it is tender.  Patient states that he has had knee pain for approximately 18 years.  He also reports that he injured his back in the [employer] many years ago but did not give a approximation of when this happened.  He states that he had back surgery in 2018.  He denies any recent injury.  He also has history of sciatica.  Currently rates his pain as an 8 out of 10.      Past Medical History:  Diagnosis Date  . Anxiety    hx of panic attacks  . Headache    hx of migraines  . History of kidney stones   . Kidney stones   . Sleep apnea     Patient Active Problem List   Diagnosis Date Noted  . Incarcerated umbilical hernia 0000000    Past Surgical History:  Procedure Laterality Date  . APPENDECTOMY    . KNEE ARTHROSCOPY Left 04/22/2017  . UMBILICAL HERNIA REPAIR N/A 04/11/2018   Procedure: HERNIA REPAIR UMBILICAL WITH MESH;  Surgeon: Benjamine Sprague, DO;  Location: [facility] ORS;  Service: General;  Laterality: N/A;  . WISDOM TOOTH EXTRACTION      Prior to Admission medications   Medication Sig Start Date End Date Taking? Authorizing Provider  predniSONE (DELTASONE) 10 MG tablet Take 6 tablets  today, on day 2 take 5 tablets, day 3 take 4 tablets, day 4 take 3 tablets, day 5 take  2 tablets and 1 tablet the last day 01/09/19   Johnn Hai, PA-C  traMADol (ULTRAM) 50 MG tablet Take 1 tablet (50 mg  total) by mouth every 8 (eight) hours as needed. 01/09/19   Johnn Hai, PA-C    Allergies Pepto-bismol [bismuth]  History reviewed. No pertinent family history.  Social History Social History   Tobacco Use  . Smoking status: Heavy Tobacco Smoker    Packs/day: 2.00    Types: Cigarettes  . Smokeless tobacco: Never Used  Substance Use Topics  . Alcohol use: Yes  . Drug use: No    Review of Systems Constitutional: No fever/chills Cardiovascular: Denies chest pain. Respiratory: Denies shortness of breath. Musculoskeletal: Positive for chronic left low back pain.  Positive chronic left knee pain.  History of left sciatica. Skin: Negative for rash. Neurological: Negative for headaches, focal weakness or numbness. ____________________________________________   PHYSICAL EXAM:  VITAL SIGNS: ED Triage Vitals  Enc Vitals Group     BP 01/09/19 0807 137/85     Pulse Rate 01/09/19 0807 80     Resp 01/09/19 0807 18     Temp 01/09/19 0807 (!) 97.4 F (36.3 C)     Temp Source 01/09/19 0807 Oral     SpO2 01/09/19 0807 98 %     Weight 01/09/19 0809 275 lb (124.7 kg)     Height 01/09/19 0809 6\' 1"  (1.854 m)  Head Circumference --      Peak Flow --      Pain Score 01/09/19 0809 8     Pain Loc --      Pain Edu? --      Excl. in [place]? --    Constitutional: Alert and oriented. Well appearing and in no acute distress. Eyes: Conjunctivae are normal.  Head: Atraumatic. Neck: No stridor.   Cardiovascular: Normal rate, regular rhythm. Grossly normal heart sounds.  Good peripheral circulation. Respiratory: Normal respiratory effort.  No retractions. Lungs CTAB. Gastrointestinal: Soft and nontender. No distention. Musculoskeletal: Examination of the lower back there is no gross deformity however patient is tender on palpation of the sacrum and especially on the left SI joint area.  Range of motion is slow and guarded secondary to pain.  Patient is able move left lower extremity  without assistance.  He was noted to be ambulatory with a slight limp.  Good muscle strength bilaterally and straight leg raises were approximately 30 degrees bilaterally.  Also left knee there is no erythema or discoloration.  No effusion is noted.  Range of motion with mild crepitus.  There is a tender cystic formation on the medial aspect of the left knee that patient is concerned about.  Area is mobile and without warmth.  Skin is intact. Neurologic:  Normal speech and language. No gross focal neurologic deficits are appreciated. No gait instability. Skin:  Skin is warm, dry.  No rash noted. Psychiatric: Mood and affect are normal. Speech and behavior are normal.  ____________________________________________   LABS (all labs ordered are listed, but only abnormal results are displayed)  Labs Reviewed - No data to display  RADIOLOGY  Official radiology report(s): Dg Knee Complete 4 Views Left  Result Date: 01/09/2019 CLINICAL DATA:  Chronic knee pain. EXAM: LEFT KNEE - COMPLETE 4+ VIEW COMPARISON:  01/11/2018 FINDINGS: The joint spaces are maintained. No acute bony findings or degenerative changes. No osteochondral lesion. No definite joint effusion. IMPRESSION: No acute bony findings, significant degenerative changes or joint effusion. Electronically Signed   By: Marijo Sanes M.D.   On: 01/09/2019 09:21    ____________________________________________   PROCEDURES  Procedure(s) performed (including Critical Care):  Procedures   ____________________________________________   INITIAL IMPRESSION / ASSESSMENT AND PLAN / ED COURSE  As part of my medical decision making, I reviewed the following data within the electronic MEDICAL RECORD NUMBER Notes from prior ED visits and [place] Controlled Substance Database  38 year old male presents to the ED with complaint of chronic back pain for approximately 18 years.  Patient also has chronic left knee pain and is here for exacerbation of his pain  without history of recent injury.  Patient states that he is waiting for his insurance to go through so that he can get his benefits through the [facility] hospital.  In looking through his record he has been to multiple physicians.  There were least 3 orthopedist that he was seen in his chart review.  He also is received a cortisone injection in his left knee and was seen at [facility] where he told them that he would come back for an MRI in December 2020.  Left knee x-ray was negative for any acute bony injury.  Patient was made aware.  Patient was given a prescription for prednisone tapering dose for the next 6 days.  He was also given a limited prescription for tramadol to take every 8 hours if needed for pain.  He was encouraged to  follow-up with [facility] for his chronic pain and further evaluation of his left knee.  ____________________________________________   FINAL CLINICAL IMPRESSION(S) / ED DIAGNOSES  Final diagnoses:  Chronic left-sided low back pain with left-sided sciatica  Chronic pain of left knee     ED Discharge Orders         Ordered    predniSONE (DELTASONE) 10 MG tablet     01/09/19 0933    traMADol (ULTRAM) 50 MG tablet  Every 8 hours PRN     01/09/19 0933           Note:  This document was prepared using Dragon voice recognition software and may include unintentional dictation errors.    Johnn Hai, PA-C 01/09/19 1526    Earleen Newport, MD 01/10/19 1505

## 2019-01-09 NOTE — ED Notes (Signed)
Pt upset stating that he [facility] not [facility] because he is currently waiting on his insurance to go through. Suanne Marker, [place] aware.

## 2019-01-09 NOTE — ED Triage Notes (Signed)
Pt here with chronic lower left sided back pain that radiates down his leg, and chronic left knee pain for 18 years. States he was injured in the [employer] many years ago. Walked with limp to triage.

## 2019-01-09 NOTE — Discharge Instructions (Signed)
Follow-up with the [facility] if any continued problems.  Begin taking prednisone tapering dose over the next 6 days.  Tramadol if needed for pain.  You may use ice to your back and knee discomfort which may reduce your pain.

## 2019-04-24 ENCOUNTER — Emergency Department
Admission: EM | Admit: 2019-04-24 | Discharge: 2019-04-24 | Disposition: A | Discharge status: Home / Self Care | Payer: Self-pay | Attending: Emergency Medicine | Admitting: Emergency Medicine

## 2019-04-24 ENCOUNTER — Other Ambulatory Visit: Payer: Self-pay

## 2019-04-24 ENCOUNTER — Encounter: Payer: Self-pay | Admitting: Emergency Medicine

## 2019-04-24 DIAGNOSIS — Z87442 Personal history of urinary calculi: Secondary | ICD-10-CM | POA: Insufficient documentation

## 2019-04-24 DIAGNOSIS — M5442 Lumbago with sciatica, left side: Secondary | ICD-10-CM | POA: Insufficient documentation

## 2019-04-24 DIAGNOSIS — F17210 Nicotine dependence, cigarettes, uncomplicated: Secondary | ICD-10-CM | POA: Insufficient documentation

## 2019-04-24 MED ORDER — PREDNISONE 10 MG (48) PO TBPK: ORAL_TABLET | ORAL | 0 refills | Status: DC

## 2019-04-24 MED ORDER — BACLOFEN 10 MG PO TABS: 10.0000 mg | ORAL_TABLET | Freq: Every day | ORAL | 1 refills | Status: AC

## 2019-04-24 MED ORDER — HYDROCODONE-ACETAMINOPHEN 5-325 MG PO TABS: 1.0000 | ORAL_TABLET | Freq: Four times a day (QID) | ORAL | 0 refills | Status: DC | PRN

## 2019-04-24 NOTE — ED Provider Notes (Signed)
[facility] Emergency Department Provider Note  ____________________________________________   First MD Initiated Contact with Patient 04/24/19 1658     (approximate)  I have reviewed the triage vital signs and the nursing notes.   HISTORY  Chief Complaint Back Pain and Leg Pain    HPI Brad Mcfarland is a 39 y.o. 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  He states it is making it very difficult for him to work.  He states he got in the truck today to check the equipment and had severe amount of pain.   Past Medical History:  Diagnosis Date  . Anxiety    hx of panic attacks  . Headache    hx of migraines  . History of kidney stones   . Kidney stones   . Sleep apnea     Patient Active Problem List   Diagnosis Date Noted  . Incarcerated umbilical hernia 0000000    Past Surgical History:  Procedure Laterality Date  . APPENDECTOMY    . KNEE ARTHROSCOPY Left 04/22/2017  . UMBILICAL HERNIA REPAIR N/A 04/11/2018   Procedure: HERNIA REPAIR UMBILICAL WITH MESH;  Surgeon: Benjamine Sprague, DO;  Location: [facility] ORS;  Service: General;  Laterality: N/A;  . WISDOM TOOTH EXTRACTION      Prior to Admission medications   Medication Sig Start Date End Date Taking? Authorizing Provider  baclofen (LIORESAL) 10 MG tablet Take 1 tablet (10 mg total) by mouth daily. 04/24/19 04/23/20  Landen Knoedler, Linden Dolin, PA-C  HYDROcodone-acetaminophen (NORCO/VICODIN) 5-325 MG tablet Take 1 tablet by mouth every 6 (six) hours as needed for moderate pain. 04/24/19   Cadance Raus, Linden Dolin,  PA-C  predniSONE (STERAPRED UNI-PAK 48 TAB) 10 MG (48) TBPK tablet Take 6 pills for 2 days, 5 pills x 2d, 4 pills x 2d, 3 pills x 2d, 2 pills x 2d, 1 pill x 2d 04/24/19   Versie Starks, PA-C    Allergies Pepto-bismol [bismuth]  No family history on file.  Social History Social History   Tobacco Use  . Smoking status: Heavy Tobacco Smoker    Packs/day: 2.00    Types: Cigarettes  . Smokeless tobacco: Never Used  Substance Use Topics  . Alcohol use: Yes  . Drug use: No    Review of Systems  Constitutional: No fever/chills Eyes: No visual changes. ENT: No sore throat. Respiratory: Denies cough Genitourinary: Negative for dysuria. Musculoskeletal: Positive for back pain. Skin: Negative for rash.    ____________________________________________   PHYSICAL EXAM:  VITAL SIGNS: ED Triage Vitals [04/24/19 1602]  Enc Vitals Group     BP (!) 159/87     Pulse Rate 90     Resp 16     Temp (!) 97.5 F (36.4 C)     Temp Source Oral     SpO2 97 %     Weight 275 lb (124.7 kg)     Height 6\' 2"  (1.88 m)     Head Circumference      Peak Flow      Pain Score 8  Pain Loc      Pain Edu?      Excl. in [place]?     Constitutional: Alert and oriented. Well appearing and in no acute distress. Eyes: Conjunctivae are normal.  Head: Atraumatic. Nose: No congestion/rhinnorhea. Mouth/Throat: Mucous membranes are moist.   Neck:  supple no lymphadenopathy noted Cardiovascular: Normal rate, regular rhythm.  Respiratory: Normal respiratory effort.  No retractions,  GU: deferred Musculoskeletal: FROM all extremities, warm and well perfused.  Decreased rom of back due to discomfort, lumbar spine mildly tender, good strength in great toes b/l, good strength in lower legs, n/v intact, patient is able to ambulate without difficulty Neurologic:  Normal speech and language.  Skin:  Skin is warm, dry and intact. No rash noted. Psychiatric: Mood and affect are normal. Speech and behavior are  normal.  ____________________________________________   LABS (all labs ordered are listed, but only abnormal results are displayed)  Labs Reviewed - No data to display ____________________________________________   ____________________________________________  RADIOLOGY    ____________________________________________   PROCEDURES  Procedure(s) performed: No  Procedures    ____________________________________________   INITIAL IMPRESSION / ASSESSMENT AND PLAN / ED COURSE  Pertinent labs & imaging results that were available during my care of the patient were reviewed by me and considered in my medical decision making (see chart for details).   Patient is 39 year old male presents emergency department with back pain.  See HPI  Physical exam shows the lumbar area to be tender.  Decreased range of motion secondary discomfort.  Explained the findings to the patient.  He was given a prescription for Sterapred 12-day Dosepak, baclofen, and hydrocodone.  He is to follow-up with [facility] clinic neurosurgery.  Return emergency department if worsening.  He was given back exercises to perform.  Explained to him to use ice to decrease the amount of inflammation on the lower back.  States he understands will comply.  He was discharged stable condition.     As part of my medical decision making, I reviewed the following data within the [facility] notes reviewed and incorporated, Old chart reviewed, Notes from prior ED visits and  Controlled Substance Database  ____________________________________________   FINAL CLINICAL IMPRESSION(S) / ED DIAGNOSES  Final diagnoses:  Acute midline low back pain with left-sided sciatica      NEW MEDICATIONS STARTED DURING THIS VISIT:  Discharge Medication List as of 04/24/2019  5:00 PM    START taking these medications   Details  baclofen (LIORESAL) 10 MG tablet Take 1 tablet (10 mg total) by mouth daily.,  Starting Wed 04/24/2019, Until Thu 04/23/2020, Normal    HYDROcodone-acetaminophen (NORCO/VICODIN) 5-325 MG tablet Take 1 tablet by mouth every 6 (six) hours as needed for moderate pain., Starting Wed 04/24/2019, Normal    predniSONE (STERAPRED UNI-PAK 48 TAB) 10 MG (48) TBPK tablet Take 6 pills for 2 days, 5 pills x 2d, 4 pills x 2d, 3 pills x 2d, 2 pills x 2d, 1 pill x 2d, Normal         Note:  This document was prepared using Dragon voice recognition software and may include unintentional dictation errors.     Versie Starks, PA-C 04/24/19 1837    Earleen Newport, MD 04/24/19 [phone]

## 2019-04-24 NOTE — ED Notes (Signed)
[place] signature pad not working to sign discharge.  Discharge instruction reviewed with patient, pt verbalized understanding

## 2019-04-24 NOTE — ED Triage Notes (Signed)
Patient reports back pain and left leg pain and numbness. History of sciatica. Patient reports pain has been worsening x1 month. Patient has physical job, however denies any know injury.

## 2019-04-24 NOTE — Discharge Instructions (Addendum)
Follow-up with [facility] clinic neurosurgery.  Please call for an appointment.  Take medications as prescribed.  You may also take over-the-counter Tylenol.  Do not take ibuprofen or Aleve while taking the steroid pack.  Apply ice to your lower back.  Do not carry anything in your back pockets as when you sit this will throw your back out of place.  Consider seeing a chiropractor.

## 2019-05-08 ENCOUNTER — Other Ambulatory Visit: Payer: Self-pay | Admitting: Surgery

## 2019-05-08 DIAGNOSIS — K429 Umbilical hernia without obstruction or gangrene: Secondary | ICD-10-CM

## 2019-05-14 ENCOUNTER — Ambulatory Visit: Payer: Self-pay

## 2019-05-28 ENCOUNTER — Ambulatory Visit
Admission: RE | Admit: 2019-05-28 | Discharge: 2019-05-28 | Disposition: A | Discharge status: Home / Self Care | Payer: Self-pay | Source: Ambulatory Visit | Attending: Surgery | Admitting: Surgery

## 2019-05-28 ENCOUNTER — Other Ambulatory Visit: Payer: Self-pay

## 2019-05-28 DIAGNOSIS — K429 Umbilical hernia without obstruction or gangrene: Secondary | ICD-10-CM | POA: Insufficient documentation

## 2019-07-19 ENCOUNTER — Other Ambulatory Visit: Payer: Self-pay | Admitting: Physical Medicine and Rehabilitation

## 2019-07-19 DIAGNOSIS — M5416 Radiculopathy, lumbar region: Secondary | ICD-10-CM

## 2019-08-17 ENCOUNTER — Other Ambulatory Visit: Payer: Self-pay

## 2019-08-17 ENCOUNTER — Ambulatory Visit
Admission: RE | Admit: 2019-08-17 | Discharge: 2019-08-17 | Disposition: A | Discharge status: Home / Self Care | Payer: No Typology Code available for payment source | Source: Ambulatory Visit | Attending: Physical Medicine and Rehabilitation | Admitting: Physical Medicine and Rehabilitation

## 2019-08-17 DIAGNOSIS — M5416 Radiculopathy, lumbar region: Secondary | ICD-10-CM

## 2019-12-17 ENCOUNTER — Other Ambulatory Visit: Payer: Self-pay | Admitting: Physical Medicine and Rehabilitation

## 2019-12-17 DIAGNOSIS — M5416 Radiculopathy, lumbar region: Secondary | ICD-10-CM

## 2019-12-18 ENCOUNTER — Other Ambulatory Visit: Payer: Self-pay

## 2019-12-18 ENCOUNTER — Ambulatory Visit
Admission: RE | Admit: 2019-12-18 | Discharge: 2019-12-18 | Disposition: A | Discharge status: Home / Self Care | Payer: No Typology Code available for payment source | Source: Ambulatory Visit | Attending: Physical Medicine and Rehabilitation | Admitting: Physical Medicine and Rehabilitation

## 2019-12-18 DIAGNOSIS — M5416 Radiculopathy, lumbar region: Secondary | ICD-10-CM

## 2020-01-20 ENCOUNTER — Other Ambulatory Visit: Payer: Self-pay

## 2020-01-20 ENCOUNTER — Emergency Department: Payer: Medicaid Other

## 2020-01-20 ENCOUNTER — Inpatient Hospital Stay
Admission: EM | Admit: 2020-01-20 | Discharge: 2020-01-20 | DRG: 835 | Disposition: A | Discharge status: Other Acute Inpatient Hospital | Payer: Medicaid Other | Attending: Internal Medicine | Admitting: Internal Medicine

## 2020-01-20 ENCOUNTER — Encounter: Payer: Self-pay | Admitting: Emergency Medicine

## 2020-01-20 DIAGNOSIS — F17210 Nicotine dependence, cigarettes, uncomplicated: Secondary | ICD-10-CM | POA: Diagnosis present

## 2020-01-20 DIAGNOSIS — E872 Acidosis, unspecified: Secondary | ICD-10-CM

## 2020-01-20 DIAGNOSIS — N179 Acute kidney failure, unspecified: Secondary | ICD-10-CM | POA: Diagnosis present

## 2020-01-20 DIAGNOSIS — C9590 Leukemia, unspecified not having achieved remission: Secondary | ICD-10-CM

## 2020-01-20 DIAGNOSIS — E860 Dehydration: Secondary | ICD-10-CM

## 2020-01-20 DIAGNOSIS — Z20822 Contact with and (suspected) exposure to covid-19: Secondary | ICD-10-CM | POA: Diagnosis present

## 2020-01-20 DIAGNOSIS — D696 Thrombocytopenia, unspecified: Secondary | ICD-10-CM | POA: Diagnosis present

## 2020-01-20 DIAGNOSIS — Z79899 Other long term (current) drug therapy: Secondary | ICD-10-CM

## 2020-01-20 DIAGNOSIS — R0789 Other chest pain: Secondary | ICD-10-CM | POA: Diagnosis present

## 2020-01-20 DIAGNOSIS — F4310 Post-traumatic stress disorder, unspecified: Secondary | ICD-10-CM | POA: Diagnosis present

## 2020-01-20 DIAGNOSIS — D72829 Elevated white blood cell count, unspecified: Secondary | ICD-10-CM | POA: Diagnosis present

## 2020-01-20 DIAGNOSIS — C9500 Acute leukemia of unspecified cell type not having achieved remission: Secondary | ICD-10-CM

## 2020-01-20 LAB — CBC WITH DIFFERENTIAL/PLATELET
Abs Immature Granulocytes: 0.33 10*3/uL — ABNORMAL HIGH (ref 0.00–0.07)
Basophils Absolute: 0.3 10*3/uL — ABNORMAL HIGH (ref 0.0–0.1)
Basophils Relative: 0 %
Eosinophils Absolute: 0.1 10*3/uL (ref 0.0–0.5)
Eosinophils Relative: 0 %
HCT: 28.7 % — ABNORMAL LOW (ref 39.0–52.0)
Hemoglobin: 10.1 g/dL — ABNORMAL LOW (ref 13.0–17.0)
Immature Granulocytes: 1 %
Lymphocytes Relative: 87 %
Lymphs Abs: 58.9 10*3/uL — ABNORMAL HIGH (ref 0.7–4.0)
MCH: 29.2 pg (ref 26.0–34.0)
MCHC: 35.2 g/dL (ref 30.0–36.0)
MCV: 82.9 fL (ref 80.0–100.0)
Monocytes Absolute: 7.7 10*3/uL — ABNORMAL HIGH (ref 0.1–1.0)
Monocytes Relative: 11 %
Neutro Abs: 0.7 10*3/uL — ABNORMAL LOW (ref 1.7–7.7)
Neutrophils Relative %: 1 %
Platelets: 21 10*3/uL — CL (ref 150–400)
RBC: 3.46 MIL/uL — ABNORMAL LOW (ref 4.22–5.81)
RDW: 16.5 % — ABNORMAL HIGH (ref 11.5–15.5)
Smear Review: DECREASED
WBC: 68 10*3/uL (ref 4.0–10.5)
nRBC: 0 % (ref 0.0–0.2)

## 2020-01-20 LAB — COMPREHENSIVE METABOLIC PANEL
ALT: 58 U/L — ABNORMAL HIGH (ref 0–44)
AST: 41 U/L (ref 15–41)
Albumin: 3.9 g/dL (ref 3.5–5.0)
Alkaline Phosphatase: 248 U/L — ABNORMAL HIGH (ref 38–126)
Anion gap: 14 (ref 5–15)
BUN: 17 mg/dL (ref 6–20)
CO2: 22 mmol/L (ref 22–32)
Calcium: 9.2 mg/dL (ref 8.9–10.3)
Chloride: 98 mmol/L (ref 98–111)
Creatinine, Ser: 1.37 mg/dL — ABNORMAL HIGH (ref 0.61–1.24)
GFR, Estimated: >60 mL/min (ref >60)
Glucose, Bld: 96 mg/dL (ref 70–99)
Potassium: 4.1 mmol/L (ref 3.5–5.1)
Sodium: 134 mmol/L — ABNORMAL LOW (ref 135–145)
Total Bilirubin: 3.4 mg/dL — ABNORMAL HIGH (ref 0.3–1.2)
Total Protein: 7.6 g/dL (ref 6.5–8.1)

## 2020-01-20 LAB — PHOSPHORUS: Phosphorus: 4.5 mg/dL (ref 2.5–4.6)

## 2020-01-20 LAB — PROTIME-INR
INR: 1.1 (ref 0.8–1.2)
Prothrombin Time: 13.5 seconds (ref 11.4–15.2)

## 2020-01-20 LAB — TROPONIN I (HIGH SENSITIVITY)
Troponin I (High Sensitivity): 19 ng/L — ABNORMAL HIGH (ref <18)
Troponin I (High Sensitivity): 17 ng/L (ref <18)

## 2020-01-20 LAB — RESPIRATORY PANEL BY RT PCR (FLU A&B, COVID)
Influenza A by PCR: NEGATIVE
Influenza B by PCR: NEGATIVE
SARS Coronavirus 2 by RT PCR: NEGATIVE

## 2020-01-20 LAB — FIBRINOGEN: Fibrinogen: 406 mg/dL (ref 210–475)

## 2020-01-20 LAB — APTT: aPTT: 38 seconds — ABNORMAL HIGH (ref 24–36)

## 2020-01-20 LAB — LACTIC ACID, PLASMA
Lactic Acid, Venous: 2.2 mmol/L (ref 0.5–1.9)
Lactic Acid, Venous: 1.8 mmol/L (ref 0.5–1.9)

## 2020-01-20 LAB — URIC ACID: Uric Acid, Serum: 7.5 mg/dL (ref 3.7–8.6)

## 2020-01-20 LAB — LACTATE DEHYDROGENASE: LDH: 288 U/L — ABNORMAL HIGH (ref 98–192)

## 2020-01-20 LAB — PATHOLOGIST SMEAR REVIEW

## 2020-01-20 MED ORDER — ACETAMINOPHEN 500 MG PO TABS
1000.0000 mg | ORAL_TABLET | Freq: Once | ORAL | Status: AC
  Administered 2020-01-20: 1000 mg via ORAL

## 2020-01-20 MED ORDER — LACTATED RINGERS IV BOLUS
1000.0000 mL | Freq: Once | INTRAVENOUS | Status: AC
  Administered 2020-01-20: 1000 mL via INTRAVENOUS

## 2020-01-20 MED ORDER — SODIUM CHLORIDE 0.9 % IV BOLUS
1000.0000 mL | Freq: Once | INTRAVENOUS | Status: AC
  Administered 2020-01-20: 1000 mL via INTRAVENOUS

## 2020-01-20 MED ORDER — HYDROXYUREA 500 MG PO CAPS
2000.0000 mg | ORAL_CAPSULE | Freq: Once | ORAL | Status: AC
  Administered 2020-01-20: 2000 mg via ORAL
  Filled 2020-01-20: qty 4

## 2020-01-20 MED ORDER — LACTATED RINGERS IV SOLN: INTRAVENOUS | Status: DC

## 2020-01-20 MED ORDER — ALLOPURINOL 300 MG PO TABS: 600.0000 mg | ORAL_TABLET | Freq: Every day | ORAL | Status: DC

## 2020-01-20 MED ORDER — ALLOPURINOL 300 MG PO TABS
600.0000 mg | ORAL_TABLET | Freq: Once | ORAL | Status: AC
  Administered 2020-01-20: 600 mg via ORAL
  Filled 2020-01-20: qty 2

## 2020-01-20 NOTE — Progress Notes (Signed)
Full note to follow. 39 yr old male presenting with myalgias and generalized weakness. Wbc 68, platelets 21 and hb 10. 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  TLS and DIC labs have been ordered and need to be drawn. Please start patient on allopurinol 300 mg daily and IVF. Total bili elevated at 3.4  Patient needs to be transferred to tertiary center like [facility] or [facility] for further management of acute leukemia. This cannot be treated here.  I have spoken to Dr. Clint Bolder from [place] and he is aware that if patient comes to [facility] ER, they will accept him.  Dr. Tamala Julian from [facility] ER notified as soon as I got a call from pathology about his smear  Dr. Randa Evens, MD, MPH [facility] at [facility] [phone] 01/20/2020 6:48 PM

## 2020-01-20 NOTE — Consult Note (Addendum)
Hematology/Oncology Consult note [facility] [phone] [phone]  Patient Care Team: Leonel Ramsay, MD as PCP - General (Infectious Diseases)   Name of the patient: Brad Mcfarland  [id number]  February 10, 1981    Reason for consult: leucocytosis   Requesting physician : Dr. Francine Graven  Date of visit: 01/20/2020    History of presenting illness-patient is a 39 year old male with a past medical history significant for sleep apnea and anxiety among other medical problems.  He presented to the ER with symptoms of myalgias and chest pain which has been ongoing For the last 1 week.  He has been feeling progressively fatigued.  Denies any cough or shortness of breath.  Denies any headaches or episodes of confusion.  Denies any gum bleeds or nosebleeds.  Denies any chest pain presently.  At baseline patient does not have any significant medical problems.  He has not been vaccinated against COVID-19.On arrival to the ER patient was noted to have white cell count of 68,000 with a platelets of 21 and H&H of 10.1/28.7.  CMP was significant for elevated total bilirubin of 3.4 and mildly elevated ALT of 58.  Alkaline phosphatase elevated at 248.  Lactic acid was normal.  Hematology consulted for concern for acute leukemia.  ECOG PS- 0  Pain scale- 0   Review of systems- Review of Systems  Constitutional: Positive for malaise/fatigue. Negative for chills, fever and weight loss.       Generalized weakness  HENT: Negative for congestion, ear discharge and nosebleeds.   Eyes: Negative for blurred vision.  Respiratory: Negative for cough, hemoptysis, sputum production, shortness of breath and wheezing.   Cardiovascular: Negative for chest pain, palpitations, orthopnea and claudication.  Gastrointestinal: Negative for abdominal pain, blood in stool, constipation, diarrhea, heartburn, melena, nausea and vomiting.  Genitourinary: Negative for dysuria, flank  pain, frequency, hematuria and urgency.  Musculoskeletal: Negative for back pain, joint pain and myalgias.  Skin: Negative for rash.  Neurological: Negative for dizziness, tingling, focal weakness, seizures, weakness and headaches.  Endo/Heme/Allergies: Does not bruise/bleed easily.  Psychiatric/Behavioral: Negative for depression and suicidal ideas. The patient does not have insomnia.     Allergies  Allergen Reactions  . Pepto-Bismol [Bismuth] Nausea And Vomiting    Patient Active Problem List   Diagnosis Date Noted  . Leukocytosis 01/20/2020  . Incarcerated umbilical hernia 45/62/5638     Past Medical History:  Diagnosis Date  . Anxiety    hx of panic attacks  . Headache    hx of migraines  . History of kidney stones   . Kidney stones   . Sleep apnea      Past Surgical History:  Procedure Laterality Date  . APPENDECTOMY    . KNEE ARTHROSCOPY Left 04/22/2017  . UMBILICAL HERNIA REPAIR N/A 04/11/2018   Procedure: HERNIA REPAIR UMBILICAL WITH MESH;  Surgeon: Benjamine Sprague, DO;  Location: [facility] ORS;  Service: General;  Laterality: N/A;  . WISDOM TOOTH EXTRACTION      Social History   Socioeconomic History  . Marital status: Married    Spouse name: Not on file  . Number of children: Not on file  . Years of education: Not on file  . Highest education level: Not on file  Occupational History  . Not on file  Tobacco Use  . Smoking status: Heavy Tobacco Smoker    Packs/day: 2.00    Types: Cigarettes  . Smokeless tobacco: Never Used  Substance and Sexual Activity  . Alcohol use:  Yes  . Drug use: No  . Sexual activity: Not on file  Other Topics Concern  . Not on file  Social History Narrative  . Not on file   Social Determinants of Health   Financial Resource Strain:   . Difficulty of Paying Living Expenses: Not on file  Food Insecurity:   . Worried About Charity fundraiser in the Last Year: Not on file  . Ran Out of Food in the Last Year: Not on file    Transportation Needs:   . Lack of Transportation (Medical): Not on file  . Lack of Transportation (Non-Medical): Not on file  Physical Activity:   . Days of Exercise per Week: Not on file  . Minutes of Exercise per Session: Not on file  Stress:   . Feeling of Stress : Not on file  Social Connections:   . Frequency of Communication with Friends and Family: Not on file  . Frequency of Social Gatherings with Friends and Family: Not on file  . Attends Religious Services: Not on file  . Active Member of Clubs or Organizations: Not on file  . Attends Archivist Meetings: Not on file  . Marital Status: Not on file  Intimate Partner Violence:   . Fear of Current or Ex-Partner: Not on file  . Emotionally Abused: Not on file  . Physically Abused: Not on file  . Sexually Abused: Not on file     No family history on file.   Current Facility-Administered Medications:  .  allopurinol (ZYLOPRIM) tablet 600 mg, 600 mg, Oral, Once, Vladimir Crofts, MD .  lactated ringers infusion, , Intravenous, Continuous, Vladimir Crofts, MD  Current Outpatient Medications:  .  ALPRAZolam (XANAX) 0.25 MG tablet, Take 0.25 mg by mouth 2 (two) times daily as needed. , Disp: , Rfl:  .  baclofen (LIORESAL) 10 MG tablet, Take 1 tablet (10 mg total) by mouth daily. (Patient not taking: Reported on 01/20/2020), Disp: 30 tablet, Rfl: 1 .  buPROPion (WELLBUTRIN XL) 150 MG 24 hr tablet, Take 150 mg by mouth daily., Disp: , Rfl:  .  DULoxetine HCl 40 MG CPEP, Take 1 capsule by mouth daily., Disp: , Rfl:  .  gabapentin (NEURONTIN) 300 MG capsule, Take 300 mg by mouth at bedtime., Disp: , Rfl:  .  HYDROcodone-acetaminophen (NORCO/VICODIN) 5-325 MG tablet, Take 1 tablet by mouth every 6 (six) hours as needed for moderate pain. (Patient not taking: Reported on 01/20/2020), Disp: 15 tablet, Rfl: 0 .  predniSONE (STERAPRED UNI-PAK 48 TAB) 10 MG (48) TBPK tablet, Take 6 pills for 2 days, 5 pills x 2d, 4 pills x 2d, 3 pills  x 2d, 2 pills x 2d, 1 pill x 2d (Patient not taking: Reported on 01/20/2020), Disp: 48 tablet, Rfl: 0 .  traMADol (ULTRAM) 50 MG tablet, Take 50 mg by mouth daily as needed for moderate pain. , Disp: , Rfl:    Physical exam:  Vitals:   01/20/20 1600 01/20/20 1730 01/20/20 1800 01/20/20 1830  BP: (!) 151/89 (!) 149/87 (!) 152/84 (!) 133/104  Pulse: 100 100 (!) 110 (!) 104  Resp: (!) 24 (!) 23 (!) 23 (!) 22  Temp:      TempSrc:      SpO2: 98% 96% 98% 94%  Weight:      Height:       Physical Exam Constitutional:      General: He is not in acute distress. Cardiovascular:     Rate and Rhythm:  Normal rate and regular rhythm.     Heart sounds: Normal heart sounds.  Pulmonary:     Effort: Pulmonary effort is normal.     Breath sounds: Normal breath sounds.  Abdominal:     General: Bowel sounds are normal.     Palpations: Abdomen is soft.  Skin:    General: Skin is warm and dry.  Neurological:     General: No focal deficit present.     Mental Status: He is alert and oriented to person, place, and time.        CMP Latest Ref Rng & Units 01/20/2020  Glucose 70 - 99 mg/dL 96  BUN 6 - 20 mg/dL 17  Creatinine 0.61 - 1.24 mg/dL 1.37(H)  Sodium 135 - 145 mmol/L 134(L)  Potassium 3.5 - 5.1 mmol/L 4.1  Chloride 98 - 111 mmol/L 98  CO2 22 - 32 mmol/L 22  Calcium 8.9 - 10.3 mg/dL 9.2  Total Protein 6.5 - 8.1 g/dL 7.6  Total Bilirubin 0.3 - 1.2 mg/dL 3.4(H)  Alkaline Phos 38 - 126 U/L 248(H)  AST 15 - 41 U/L 41  ALT 0 - 44 U/L 58(H)   CBC Latest Ref Rng & Units 01/20/2020  WBC 4.0 - 10.5 K/uL 68.0(HH)  Hemoglobin 13.0 - 17.0 g/dL 10.1(L)  Hematocrit 39 - 52 % 28.7(L)  Platelets 150 - 400 K/uL 21(LL)    @IMAGES @  DG Chest 2 View  Result Date: 01/20/2020 CLINICAL DATA:  Chest pain. EXAM: CHEST - 2 VIEW COMPARISON:  July 02, 2016 FINDINGS: The heart size and mediastinal contours are within normal limits. Both lungs are clear. No pleural effusions or pneumothorax. The  visualized skeletal structures are unremarkable. IMPRESSION: No acute cardiopulmonary disease. Electronically Signed   By: Margaretha Sheffield MD   On: 01/20/2020 15:17    Assessment and plan- Patient is a 39 y.o. male with no significant past medical history admitted for myalgias and generalized weakness found to have hyperleukocytosis as well as severe thrombocytopenia concerning for acute leukemia  Patient's peripheral smear was reviewed by pathology and I have personally reviewed his smear as well.  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  Thrombocytopenia was also noted.  I explained to patient and his wife that his hyperleukocytosis and thrombocytopenia as well as peripheral smear findings are highly concerning for acute leukemia.  Given that he is young with no significant comorbidities this will need to be treated at a tertiary center with induction chemotherapy requiring a prolonged hospital stay and potential bone marrow transplant in the future.  This cannot be treated here at [facility] and therefore patient will need an ER to ER transfer to a tertiary center.  I did speak to [facility] attending who would accept the patient if the patient came to [facility] ER.  However [facility] and [facility] ER do not have any available beds and therefore the patient is in the process of being transferred to [facility].   I did obtain baseline tumor lysis syndrome labs including uric acid and phosphorus as well as DIC labs including PT PTT INR fibrinogen and LDH.  Based on his labs patient has mild AKI but he is not in overt tumor lysis syndrome.  His potassium phosphorus as well as uric acid is normal.  He does not appear to be in DIC as well with a normal PT/INR and a mildly elevated APTT of 38.  Fibrinogen level is also normal.  It is therefore unlikely that patient has  acute promyelocytic leukemia.  While the patient is awaiting transfer I have recommended that he should be started on IV fluids at  least at 100 cc an hour and started on allopurinol for TLS prophylaxis.  I have also given him a dose of hydroxyurea 2 g for cytoreduction.  Patient does not have any clinical signs and symptoms of leukostasis.   Recommend holding off on any blood or platelet transfusions unless patient is having active bleeding as it can further increase serum viscosity.  I have explained to the patient and his wife in detail about what to expect in terms of acute leukemia treatment and the need to transfer to a tertiary center.  They verbalized understanding    Visit Diagnosis 1. 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 Dehydration   3. Lactic acid acidosis   4. Other chest pain   5. Thrombocytopenia ([place])   6. 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 Randa Evens, MD, MPH [facility] at [facility] [phone] 01/20/2020

## 2020-01-20 NOTE — ED Provider Notes (Addendum)
[facility] Emergency Department Provider Note ____________________________________________   First MD Initiated Contact with Patient 01/20/20 1549     (approximate)  I have reviewed the triage vital signs and the nursing notes.  HISTORY  Chief Complaint Chest Pain and Generalized Body Aches   HPI Brad Mcfarland is a 39 y.o. Brad Mcfarland presents to the ED for evaluation of myalgias and chest pain.  Chart review indicates history of obesity, cigarette smoking chronic, back pain.  History nephrolithiasis remotely.  PTSD and former [employer]. Not vaccinated for COVID-19.  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 Live locally.  No family history of leukemia that he can recall.   Patient reports about 5-6 days of generalized weakness, generalized myalgias and intermittent chest pains.  He reports left-sided chest pain that is tender to palpation, worse when laying on his left side and is nonexertional.  Up to 5/10 intensity, lasting a matter of seconds at minutes before self resolving.  Aching in nature.  He reports associated presyncope without syncope, poor p.o. intake and frequent drinking of water.  Reports normal urinary output and decreased stool output.  Denies fevers, but reports night sweats.  Denies bleeding diatheses such as gums bleeding or brushing teeth, nosebleeds, melena, hematuria.  Past Medical History:  Diagnosis Date  . Anxiety    hx of panic attacks  . Headache    hx of migraines  . History of kidney stones   . Kidney stones   . Sleep apnea     Patient Active Problem List   Diagnosis Date Noted  . Leukocytosis 01/20/2020  . Incarcerated umbilical hernia [id number]/7371    Past Surgical History:  Procedure Laterality Date  . APPENDECTOMY    . KNEE ARTHROSCOPY Left 04/22/2017  . UMBILICAL HERNIA REPAIR N/A 04/11/2018   Procedure: HERNIA REPAIR UMBILICAL WITH MESH;  Surgeon: Benjamine Sprague, DO;  Location: [facility] ORS;  Service: General;  Laterality:  N/A;  . WISDOM TOOTH EXTRACTION      Prior to Admission medications   Medication Sig Start Date End Date Taking? Authorizing Provider  ALPRAZolam (XANAX) 0.25 MG tablet Take 0.25 mg by mouth 2 (two) times daily as needed.  01/09/20   [provider]  baclofen (LIORESAL) 10 MG tablet Take 1 tablet (10 mg total) by mouth daily. Patient not taking: Reported on 01/20/2020 04/24/19 04/23/20  Versie Starks, PA-C  buPROPion (WELLBUTRIN XL) 150 MG 24 hr tablet Take 150 mg by mouth daily. 10/30/19   [provider]  DULoxetine HCl 40 MG CPEP Take 1 capsule by mouth daily. 01/14/20   [provider]  gabapentin (NEURONTIN) 300 MG capsule Take 300 mg by mouth at bedtime. 10/15/19   [provider]  HYDROcodone-acetaminophen (NORCO/VICODIN) 5-325 MG tablet Take 1 tablet by mouth every 6 (six) hours as needed for moderate pain. Patient not taking: Reported on 01/20/2020 04/24/19   Versie Starks, PA-C  predniSONE (STERAPRED UNI-PAK 48 TAB) 10 MG (48) TBPK tablet Take 6 pills for 2 days, 5 pills x 2d, 4 pills x 2d, 3 pills x 2d, 2 pills x 2d, 1 pill x 2d Patient not taking: Reported on 01/20/2020 04/24/19   Versie Starks, PA-C  traMADol (ULTRAM) 50 MG tablet Take 50 mg by mouth daily as needed for moderate pain.  12/23/19   [provider]    Allergies Pepto-bismol [bismuth]  No family history on file.  Social History Social History   Tobacco Use  . Smoking status:  Heavy Tobacco Smoker    Packs/day: 2.00    Types: Cigarettes  . Smokeless tobacco: Never Used  Substance Use Topics  . Alcohol use: Yes  . Drug use: No    Review of Systems  Constitutional: No fever/chills Eyes: No visual changes. ENT: No sore throat. Cardiovascular: Denies chest pain. Respiratory: Denies shortness of breath. Gastrointestinal: No abdominal pain.  No nausea, no vomiting.  No diarrhea.  No constipation. Genitourinary: Negative for dysuria. Musculoskeletal: Negative  for back pain. Skin: Negative for rash. Neurological: Negative for headaches, focal weakness or numbness.  ____________________________________________   PHYSICAL EXAM:  VITAL SIGNS: Vitals:   01/20/20 2130 01/20/20 2200  BP: (!) 150/87 134/67  Pulse: (!) 101 (!) 110  Resp: 19 20  Temp: 98 F (36.7 C)   SpO2: 95% 96%      Constitutional: Alert and oriented.  Uncomfortable-appearing.  Conversational full sentences.. Eyes: Conjunctivae are normal. PERRL. EOMI. Head: Atraumatic. Nose: No congestion/rhinnorhea. Mouth/Throat: Mucous membranes are dry and pale.  Oropharynx non-erythematous. Neck: No stridor. No cervical spine tenderness to palpation. Cardiovascular: Tachycardic, regular rhythm. Grossly normal heart sounds.  Good peripheral circulation. Respiratory: Minimal conversational tachypnea, otherwise no distress.  No retractions. Lungs CTAB. Gastrointestinal: Soft , nondistended, nontender to palpation. No abdominal bruits. No CVA tenderness. Musculoskeletal: No lower extremity tenderness nor edema.  No joint effusions. No signs of acute trauma. Neurologic:  Normal speech and language. No gross focal neurologic deficits are appreciated. No gait instability noted. Skin:  Skin is warm, dry and intact. No rash noted. Psychiatric: Mood and affect are normal. Speech and behavior are normal.  ____________________________________________   LABS (all labs ordered are listed, but only abnormal results are displayed)  Labs Reviewed  LACTIC ACID, PLASMA - Abnormal; Notable for the following components:      Result Value   Lactic Acid, Venous 2.2 (*)    All other components within normal limits  COMPREHENSIVE METABOLIC PANEL - Abnormal; Notable for the following components:   Sodium 134 (*)    Creatinine, Ser 1.37 (*)    ALT 58 (*)    Alkaline Phosphatase 248 (*)    Total Bilirubin 3.4 (*)    All other components within normal limits  CBC WITH DIFFERENTIAL/PLATELET -  Abnormal; Notable for the following components:   WBC 68.0 (*)    RBC 3.46 (*)    Hemoglobin 10.1 (*)    HCT 28.7 (*)    RDW 16.5 (*)    Platelets 21 (*)    Neutro Abs 0.7 (*)    Lymphs Abs 58.9 (*)    Monocytes Absolute 7.7 (*)    Basophils Absolute 0.3 (*)    Abs Immature Granulocytes 0.33 (*)    All other components within normal limits  LACTATE DEHYDROGENASE - Abnormal; Notable for the following components:   LDH 288 (*)    All other components within normal limits  APTT - Abnormal; Notable for the following components:   aPTT 38 (*)    All other components within normal limits  TROPONIN I (HIGH SENSITIVITY) - Abnormal; Notable for the following components:   Troponin I (High Sensitivity) 19 (*)    All other components within normal limits  RESPIRATORY PANEL BY RT PCR (FLU A&B, COVID)  LACTIC ACID, PLASMA  PATHOLOGIST SMEAR REVIEW  FIBRINOGEN  PROTIME-INR  URIC ACID  PHOSPHORUS  URINALYSIS, COMPLETE (UACMP) WITH MICROSCOPIC  COMP PANEL: LEUKEMIA/LYMPHOMA  TROPONIN I (HIGH SENSITIVITY)   ____________________________________________  12 Lead EKG  Sinus rhythm,  rate of 99 bpm, normal axis, normal intervals, no evidence of acute ischemia. ____________________________________________  RADIOLOGY  ED MD interpretation: 2 view CXR without evidence of acute cardiopulmonary pathology  Official radiology report(s): DG Chest 2 View  Result Date: 01/20/2020 CLINICAL DATA:  Chest pain. EXAM: CHEST - 2 VIEW COMPARISON:  July 02, 2016 FINDINGS: The heart size and mediastinal contours are within normal limits. Both lungs are clear. No pleural effusions or pneumothorax. The visualized skeletal structures are unremarkable. IMPRESSION: No acute cardiopulmonary disease. Electronically Signed   By: Margaretha Sheffield MD   On: 01/20/2020 15:17    ____________________________________________   PROCEDURES and INTERVENTIONS  Procedure(s) performed (including Critical Care):  .1-3  Lead EKG Interpretation Performed by: Vladimir Crofts, MD Authorized by: Vladimir Crofts, MD     Interpretation: abnormal     ECG rate:  110   ECG rate assessment: tachycardic     Rhythm: sinus tachycardia     Ectopy: none     Conduction: normal   .Critical Care Performed by: Vladimir Crofts, MD Authorized by: Vladimir Crofts, MD   Critical care provider statement:    Critical care time (minutes):  65   Critical care was necessary to treat or prevent imminent or life-threatening deterioration of the following conditions:  Dehydration and metabolic crisis   Critical care was time spent personally by me on the following activities:  Discussions with consultants, evaluation of patient's response to treatment, examination of patient, ordering and performing treatments and interventions, ordering and review of laboratory studies, ordering and review of radiographic studies, pulse oximetry, re-evaluation of patient's condition, obtaining history from patient or surrogate and review of old charts    Medications  lactated ringers infusion ( Intravenous New Bag/Given 01/20/20 2039)  sodium chloride 0.9 % bolus 1,000 mL (0 mLs Intravenous Stopped 01/20/20 1618)  lactated ringers bolus 1,000 mL (0 mLs Intravenous Stopped 01/20/20 1850)  hydroxyurea (HYDREA) capsule 2,000 mg (2,000 mg Oral Given 01/20/20 1848)  allopurinol (ZYLOPRIM) tablet 600 mg (600 mg Oral Given 01/20/20 2038)  acetaminophen (TYLENOL) tablet 1,000 mg (1,000 mg Oral Given 01/20/20 2147)    ____________________________________________   MDM / ED COURSE 39 year old male presents from home with evidence of new onset leukemia with associated dehydration requiring medical admission.  Patient tachycardic, otherwise hemodynamically stable.  Exam demonstrates a tired appearing male without evidence of distress, trauma or bleeding disorders.  Blood work demonstrates significant hematologic derangements with leukocytosis, thrombocytopenia and  anemia.  No urgent indications for transfusion.  EKG is nonischemic and troponin is low, we will trend this.  We will provide additional IV fluids for symptomatic care and admit the patient to hospitalist for further work-up and management.  Briefly after being admitted to hospitalist, heme/onc see the patient and indicates that they do not perform induction chemotherapy at this facility.  I was unaware of this.  Upon the recommendations, we transferred to a facility with this capability.  Tumor lysis labs without evidence of severe tumor lysis syndrome.  Started the patient on hydroxyurea and allopurinol to help prevent this while we rehydrate him and arrange transport.  No acute events while in the ED.  No evidence of acute infection, no indications for antibiotics.  Clinical Course as of Jan 20 2223  Mon Jan 20, 2020  1623 Spoke with admitting hospitalist who agrees to see the patient for medical admission.  She will contact hematology/oncology   [DS]  1700 Hematologist/oncologist on call in the ER to see the patient.  She  reports that we typically do not perform induction chemotherapy at this hospital, which I was unaware of, and that patient may need transfer to another institution to facilitate this.  She will go to Path lab to review his peripheral smear and talk to the patient, before she speaks to me again about the possibility of transfer.   [DS]  0076 Heme/onc, Dr. Janese Banks, requested transfer to either [facility] or [facility]. Will start with [place], asked secretary to start transfer process   [DS]  1724 Spoke with [facility] transfer center   [DS]  1801 Path Review: Blood smear is reviewed. [DS]  1803 Spoke with Heme fellow, Dr. ? Accepted to Dr. Royce Macadamia Requesting fax and peripheral smear slide.    [DS]  1859 Secretary calls [facility] transfer center and they indicate that they do not know if patient will have a bed tonight and they can give an answer if and when bed will become available.  When  discussing the case with my oncologist, I am concerned about the patient and thinks he needs to be transferred to soon as possible.  We will reach out to [facility] and see if they have any beds available.   [DS]  1920 Callback from [facility] transfer center They are at capacity too and cannot tell me when a bed will become available.   [DS]  1947 Callback from [facility] transfer center. Dr. Florene Glen, heme/onc attending on call.  We discussed the patient and he agrees to accept the patient tonight.  They have open beds.  We will transfer to them.  I educated patient with his developments.  We are working on transport now.   [DS]  2040 Before [facility] calls back with a bed assignment, [facility] because of the bed assignment in their facility.  Accepted to their facility and will arrange transport to [facility].  Patient updated on this development.   [DS]  2224 EMS here for the patient now.   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   Vladimir Crofts, MD 01/20/20 [place], [place], MD 01/20/20 2226

## 2020-01-20 NOTE — ED Notes (Signed)
[facility]  CALLED  PER D R  Etta Quill MD

## 2020-01-20 NOTE — ED Triage Notes (Signed)
C/O 5 day history of cough, body aches, fatigue, dizziness, chest pain.  Has been taking dayquil and nyquil.  No known fever, but states he sweats a lot after taking medications.

## 2020-01-20 NOTE — ED Notes (Signed)
Patient is diaphoretic.  Placed in recliner chair and kept in triage area.  Awaiting available bed.

## 2020-01-21 ENCOUNTER — Encounter
Admit: 2020-01-21 | Discharge: 2020-04-01 | Disposition: A | Discharge status: Rehabilitation Facility | Payer: MEDICAID | Source: Other Acute Inpatient Hospital | Attending: Anesthesiology | Admitting: Hematology

## 2020-01-21 ENCOUNTER — Ambulatory Visit
Admit: 2020-01-21 | Discharge: 2020-04-01 | Disposition: A | Discharge status: Rehabilitation Facility | Payer: MEDICAID | Source: Other Acute Inpatient Hospital | Admitting: Hematology

## 2020-01-21 ENCOUNTER — Encounter
Admit: 2020-01-21 | Discharge: 2020-04-01 | Disposition: A | Discharge status: Rehabilitation Facility | Payer: MEDICAID | Source: Other Acute Inpatient Hospital | Attending: Student in an Organized Health Care Education/Training Program | Admitting: Hematology

## 2020-01-23 LAB — COMP PANEL: LEUKEMIA/LYMPHOMA: Immunophenotypic Profile: 88

## 2020-02-03 DIAGNOSIS — R531 Weakness: Principal | ICD-10-CM

## 2020-02-03 DIAGNOSIS — A4102 Sepsis due to Methicillin resistant Staphylococcus aureus: Principal | ICD-10-CM

## 2020-02-03 DIAGNOSIS — M609 Myositis, unspecified: Principal | ICD-10-CM

## 2020-02-03 DIAGNOSIS — E440 Moderate protein-calorie malnutrition: Principal | ICD-10-CM

## 2020-02-03 DIAGNOSIS — G8929 Other chronic pain: Principal | ICD-10-CM

## 2020-02-03 DIAGNOSIS — K1230 Oral mucositis (ulcerative), unspecified: Principal | ICD-10-CM

## 2020-02-03 DIAGNOSIS — I10 Essential (primary) hypertension: Principal | ICD-10-CM

## 2020-02-03 DIAGNOSIS — G928 Other toxic encephalopathy: Principal | ICD-10-CM

## 2020-02-03 DIAGNOSIS — C9100 Acute lymphoblastic leukemia not having achieved remission: Principal | ICD-10-CM

## 2020-02-03 DIAGNOSIS — H81311 Aural vertigo, right ear: Principal | ICD-10-CM

## 2020-02-03 DIAGNOSIS — G893 Neoplasm related pain (acute) (chronic): Principal | ICD-10-CM

## 2020-02-03 DIAGNOSIS — R6521 Severe sepsis with septic shock: Principal | ICD-10-CM

## 2020-02-03 DIAGNOSIS — F32A Depression, unspecified: Principal | ICD-10-CM

## 2020-02-03 DIAGNOSIS — R161 Splenomegaly, not elsewhere classified: Principal | ICD-10-CM

## 2020-02-03 DIAGNOSIS — I76 Septic arterial embolism: Principal | ICD-10-CM

## 2020-02-03 DIAGNOSIS — Z7409 Other reduced mobility: Principal | ICD-10-CM

## 2020-02-03 DIAGNOSIS — B958 Unspecified staphylococcus as the cause of diseases classified elsewhere: Principal | ICD-10-CM

## 2020-02-03 DIAGNOSIS — E870 Hyperosmolality and hypernatremia: Principal | ICD-10-CM

## 2020-02-03 DIAGNOSIS — N170 Acute kidney failure with tubular necrosis: Principal | ICD-10-CM

## 2020-02-03 DIAGNOSIS — E871 Hypo-osmolality and hyponatremia: Principal | ICD-10-CM

## 2020-02-03 DIAGNOSIS — D61810 Antineoplastic chemotherapy induced pancytopenia: Principal | ICD-10-CM

## 2020-02-03 DIAGNOSIS — E872 Acidosis: Principal | ICD-10-CM

## 2020-02-03 DIAGNOSIS — G4733 Obstructive sleep apnea (adult) (pediatric): Principal | ICD-10-CM

## 2020-02-03 DIAGNOSIS — I313 Pericardial effusion (noninflammatory): Principal | ICD-10-CM

## 2020-02-03 DIAGNOSIS — H90A21 Sensorineural hearing loss, unilateral, right ear, with restricted hearing on the contralateral side: Principal | ICD-10-CM

## 2020-02-03 DIAGNOSIS — I330 Acute and subacute infective endocarditis: Principal | ICD-10-CM

## 2020-02-03 DIAGNOSIS — L89153 Pressure ulcer of sacral region, stage 3: Principal | ICD-10-CM

## 2020-02-03 DIAGNOSIS — F419 Anxiety disorder, unspecified: Principal | ICD-10-CM

## 2020-02-03 DIAGNOSIS — B377 Candidal sepsis: Principal | ICD-10-CM

## 2020-02-03 DIAGNOSIS — R066 Hiccough: Principal | ICD-10-CM

## 2020-02-03 DIAGNOSIS — K5900 Constipation, unspecified: Principal | ICD-10-CM

## 2020-02-03 DIAGNOSIS — M545 Low back pain, unspecified: Principal | ICD-10-CM

## 2020-02-03 DIAGNOSIS — J9601 Acute respiratory failure with hypoxia: Principal | ICD-10-CM

## 2020-02-03 DIAGNOSIS — D6959 Other secondary thrombocytopenia: Principal | ICD-10-CM

## 2020-02-03 DIAGNOSIS — J189 Pneumonia, unspecified organism: Principal | ICD-10-CM

## 2020-02-03 DIAGNOSIS — R21 Rash and other nonspecific skin eruption: Principal | ICD-10-CM

## 2020-02-03 DIAGNOSIS — B3789 Other sites of candidiasis: Principal | ICD-10-CM

## 2020-02-03 DIAGNOSIS — E883 Tumor lysis syndrome: Principal | ICD-10-CM

## 2020-02-03 DIAGNOSIS — L89306 Pressure-induced deep tissue damage of unspecified buttock: Principal | ICD-10-CM

## 2020-02-03 DIAGNOSIS — Z20822 Contact with and (suspected) exposure to covid-19: Principal | ICD-10-CM

## 2020-02-03 DIAGNOSIS — E875 Hyperkalemia: Principal | ICD-10-CM

## 2020-02-04 DIAGNOSIS — Z7409 Other reduced mobility: Principal | ICD-10-CM

## 2020-02-04 DIAGNOSIS — I10 Essential (primary) hypertension: Principal | ICD-10-CM

## 2020-02-04 DIAGNOSIS — E440 Moderate protein-calorie malnutrition: Principal | ICD-10-CM

## 2020-02-04 DIAGNOSIS — M545 Low back pain, unspecified: Principal | ICD-10-CM

## 2020-02-04 DIAGNOSIS — E875 Hyperkalemia: Principal | ICD-10-CM

## 2020-02-04 DIAGNOSIS — E871 Hypo-osmolality and hyponatremia: Principal | ICD-10-CM

## 2020-02-04 DIAGNOSIS — F32A Depression, unspecified: Principal | ICD-10-CM

## 2020-02-04 DIAGNOSIS — E872 Acidosis: Principal | ICD-10-CM

## 2020-02-04 DIAGNOSIS — I313 Pericardial effusion (noninflammatory): Principal | ICD-10-CM

## 2020-02-04 DIAGNOSIS — L89153 Pressure ulcer of sacral region, stage 3: Principal | ICD-10-CM

## 2020-02-04 DIAGNOSIS — F419 Anxiety disorder, unspecified: Principal | ICD-10-CM

## 2020-02-04 DIAGNOSIS — D6959 Other secondary thrombocytopenia: Principal | ICD-10-CM

## 2020-02-04 DIAGNOSIS — B377 Candidal sepsis: Principal | ICD-10-CM

## 2020-02-04 DIAGNOSIS — M609 Myositis, unspecified: Principal | ICD-10-CM

## 2020-02-04 DIAGNOSIS — Z20822 Contact with and (suspected) exposure to covid-19: Principal | ICD-10-CM

## 2020-02-04 DIAGNOSIS — D61810 Antineoplastic chemotherapy induced pancytopenia: Principal | ICD-10-CM

## 2020-02-04 DIAGNOSIS — J9601 Acute respiratory failure with hypoxia: Principal | ICD-10-CM

## 2020-02-04 DIAGNOSIS — J189 Pneumonia, unspecified organism: Principal | ICD-10-CM

## 2020-02-04 DIAGNOSIS — E883 Tumor lysis syndrome: Principal | ICD-10-CM

## 2020-02-04 DIAGNOSIS — L89306 Pressure-induced deep tissue damage of unspecified buttock: Principal | ICD-10-CM

## 2020-02-04 DIAGNOSIS — G893 Neoplasm related pain (acute) (chronic): Principal | ICD-10-CM

## 2020-02-04 DIAGNOSIS — G4733 Obstructive sleep apnea (adult) (pediatric): Principal | ICD-10-CM

## 2020-02-04 DIAGNOSIS — R21 Rash and other nonspecific skin eruption: Principal | ICD-10-CM

## 2020-02-04 DIAGNOSIS — R066 Hiccough: Principal | ICD-10-CM

## 2020-02-04 DIAGNOSIS — I76 Septic arterial embolism: Principal | ICD-10-CM

## 2020-02-04 DIAGNOSIS — H81311 Aural vertigo, right ear: Principal | ICD-10-CM

## 2020-02-04 DIAGNOSIS — I330 Acute and subacute infective endocarditis: Principal | ICD-10-CM

## 2020-02-04 DIAGNOSIS — K5900 Constipation, unspecified: Principal | ICD-10-CM

## 2020-02-04 DIAGNOSIS — A4102 Sepsis due to Methicillin resistant Staphylococcus aureus: Principal | ICD-10-CM

## 2020-02-04 DIAGNOSIS — H90A21 Sensorineural hearing loss, unilateral, right ear, with restricted hearing on the contralateral side: Principal | ICD-10-CM

## 2020-02-04 DIAGNOSIS — R161 Splenomegaly, not elsewhere classified: Principal | ICD-10-CM

## 2020-02-04 DIAGNOSIS — K1230 Oral mucositis (ulcerative), unspecified: Principal | ICD-10-CM

## 2020-02-04 DIAGNOSIS — E870 Hyperosmolality and hypernatremia: Principal | ICD-10-CM

## 2020-02-04 DIAGNOSIS — B3789 Other sites of candidiasis: Principal | ICD-10-CM

## 2020-02-04 DIAGNOSIS — B958 Unspecified staphylococcus as the cause of diseases classified elsewhere: Principal | ICD-10-CM

## 2020-02-04 DIAGNOSIS — R6521 Severe sepsis with septic shock: Principal | ICD-10-CM

## 2020-02-04 DIAGNOSIS — G928 Other toxic encephalopathy: Principal | ICD-10-CM

## 2020-02-04 DIAGNOSIS — R531 Weakness: Principal | ICD-10-CM

## 2020-02-04 DIAGNOSIS — C9100 Acute lymphoblastic leukemia not having achieved remission: Principal | ICD-10-CM

## 2020-02-04 DIAGNOSIS — G8929 Other chronic pain: Principal | ICD-10-CM

## 2020-02-04 DIAGNOSIS — N170 Acute kidney failure with tubular necrosis: Principal | ICD-10-CM

## 2020-02-07 DIAGNOSIS — G4733 Obstructive sleep apnea (adult) (pediatric): Principal | ICD-10-CM

## 2020-02-07 DIAGNOSIS — E871 Hypo-osmolality and hyponatremia: Principal | ICD-10-CM

## 2020-02-07 DIAGNOSIS — R21 Rash and other nonspecific skin eruption: Principal | ICD-10-CM

## 2020-02-07 DIAGNOSIS — J9601 Acute respiratory failure with hypoxia: Principal | ICD-10-CM

## 2020-02-07 DIAGNOSIS — R6521 Severe sepsis with septic shock: Principal | ICD-10-CM

## 2020-02-07 DIAGNOSIS — E872 Acidosis: Principal | ICD-10-CM

## 2020-02-07 DIAGNOSIS — I313 Pericardial effusion (noninflammatory): Principal | ICD-10-CM

## 2020-02-07 DIAGNOSIS — H90A21 Sensorineural hearing loss, unilateral, right ear, with restricted hearing on the contralateral side: Principal | ICD-10-CM

## 2020-02-07 DIAGNOSIS — H81311 Aural vertigo, right ear: Principal | ICD-10-CM

## 2020-02-07 DIAGNOSIS — I10 Essential (primary) hypertension: Principal | ICD-10-CM

## 2020-02-07 DIAGNOSIS — J189 Pneumonia, unspecified organism: Principal | ICD-10-CM

## 2020-02-07 DIAGNOSIS — D61810 Antineoplastic chemotherapy induced pancytopenia: Principal | ICD-10-CM

## 2020-02-07 DIAGNOSIS — L89306 Pressure-induced deep tissue damage of unspecified buttock: Principal | ICD-10-CM

## 2020-02-07 DIAGNOSIS — E870 Hyperosmolality and hypernatremia: Principal | ICD-10-CM

## 2020-02-07 DIAGNOSIS — B958 Unspecified staphylococcus as the cause of diseases classified elsewhere: Principal | ICD-10-CM

## 2020-02-07 DIAGNOSIS — Z20822 Contact with and (suspected) exposure to covid-19: Principal | ICD-10-CM

## 2020-02-07 DIAGNOSIS — E883 Tumor lysis syndrome: Principal | ICD-10-CM

## 2020-02-07 DIAGNOSIS — R531 Weakness: Principal | ICD-10-CM

## 2020-02-07 DIAGNOSIS — Z7409 Other reduced mobility: Principal | ICD-10-CM

## 2020-02-07 DIAGNOSIS — M545 Low back pain, unspecified: Principal | ICD-10-CM

## 2020-02-07 DIAGNOSIS — D6959 Other secondary thrombocytopenia: Principal | ICD-10-CM

## 2020-02-07 DIAGNOSIS — R066 Hiccough: Principal | ICD-10-CM

## 2020-02-07 DIAGNOSIS — K5900 Constipation, unspecified: Principal | ICD-10-CM

## 2020-02-07 DIAGNOSIS — R161 Splenomegaly, not elsewhere classified: Principal | ICD-10-CM

## 2020-02-07 DIAGNOSIS — G8929 Other chronic pain: Principal | ICD-10-CM

## 2020-02-07 DIAGNOSIS — B3789 Other sites of candidiasis: Principal | ICD-10-CM

## 2020-02-07 DIAGNOSIS — E440 Moderate protein-calorie malnutrition: Principal | ICD-10-CM

## 2020-02-07 DIAGNOSIS — I76 Septic arterial embolism: Principal | ICD-10-CM

## 2020-02-07 DIAGNOSIS — G893 Neoplasm related pain (acute) (chronic): Principal | ICD-10-CM

## 2020-02-07 DIAGNOSIS — E875 Hyperkalemia: Principal | ICD-10-CM

## 2020-02-07 DIAGNOSIS — B377 Candidal sepsis: Principal | ICD-10-CM

## 2020-02-07 DIAGNOSIS — A4102 Sepsis due to Methicillin resistant Staphylococcus aureus: Principal | ICD-10-CM

## 2020-02-07 DIAGNOSIS — L89153 Pressure ulcer of sacral region, stage 3: Principal | ICD-10-CM

## 2020-02-07 DIAGNOSIS — F32A Depression, unspecified: Principal | ICD-10-CM

## 2020-02-07 DIAGNOSIS — F419 Anxiety disorder, unspecified: Principal | ICD-10-CM

## 2020-02-07 DIAGNOSIS — K1230 Oral mucositis (ulcerative), unspecified: Principal | ICD-10-CM

## 2020-02-07 DIAGNOSIS — G928 Other toxic encephalopathy: Principal | ICD-10-CM

## 2020-02-07 DIAGNOSIS — I330 Acute and subacute infective endocarditis: Principal | ICD-10-CM

## 2020-02-07 DIAGNOSIS — N170 Acute kidney failure with tubular necrosis: Principal | ICD-10-CM

## 2020-02-07 DIAGNOSIS — C9100 Acute lymphoblastic leukemia not having achieved remission: Principal | ICD-10-CM

## 2020-02-07 DIAGNOSIS — M609 Myositis, unspecified: Principal | ICD-10-CM

## 2020-02-07 MED ORDER — DASATINIB 70 MG TABLET
ORAL_TABLET | ORAL | 0 refills | 15.00000 days | Status: CP
Start: 2020-02-07 — End: 2020-03-31

## 2020-02-10 DIAGNOSIS — I76 Septic arterial embolism: Principal | ICD-10-CM

## 2020-02-10 DIAGNOSIS — I313 Pericardial effusion (noninflammatory): Principal | ICD-10-CM

## 2020-02-10 DIAGNOSIS — G4733 Obstructive sleep apnea (adult) (pediatric): Principal | ICD-10-CM

## 2020-02-10 DIAGNOSIS — E870 Hyperosmolality and hypernatremia: Principal | ICD-10-CM

## 2020-02-10 DIAGNOSIS — M545 Low back pain, unspecified: Principal | ICD-10-CM

## 2020-02-10 DIAGNOSIS — C9100 Acute lymphoblastic leukemia not having achieved remission: Principal | ICD-10-CM

## 2020-02-10 DIAGNOSIS — Z7409 Other reduced mobility: Principal | ICD-10-CM

## 2020-02-10 DIAGNOSIS — F32A Depression, unspecified: Principal | ICD-10-CM

## 2020-02-10 DIAGNOSIS — R531 Weakness: Principal | ICD-10-CM

## 2020-02-10 DIAGNOSIS — D6959 Other secondary thrombocytopenia: Principal | ICD-10-CM

## 2020-02-10 DIAGNOSIS — R161 Splenomegaly, not elsewhere classified: Principal | ICD-10-CM

## 2020-02-10 DIAGNOSIS — K5900 Constipation, unspecified: Principal | ICD-10-CM

## 2020-02-10 DIAGNOSIS — E872 Acidosis: Principal | ICD-10-CM

## 2020-02-10 DIAGNOSIS — J9601 Acute respiratory failure with hypoxia: Principal | ICD-10-CM

## 2020-02-10 DIAGNOSIS — Z20822 Contact with and (suspected) exposure to covid-19: Principal | ICD-10-CM

## 2020-02-10 DIAGNOSIS — L89153 Pressure ulcer of sacral region, stage 3: Principal | ICD-10-CM

## 2020-02-10 DIAGNOSIS — H81311 Aural vertigo, right ear: Principal | ICD-10-CM

## 2020-02-10 DIAGNOSIS — L89306 Pressure-induced deep tissue damage of unspecified buttock: Principal | ICD-10-CM

## 2020-02-10 DIAGNOSIS — I330 Acute and subacute infective endocarditis: Principal | ICD-10-CM

## 2020-02-10 DIAGNOSIS — N170 Acute kidney failure with tubular necrosis: Principal | ICD-10-CM

## 2020-02-10 DIAGNOSIS — R066 Hiccough: Principal | ICD-10-CM

## 2020-02-10 DIAGNOSIS — R21 Rash and other nonspecific skin eruption: Principal | ICD-10-CM

## 2020-02-10 DIAGNOSIS — J189 Pneumonia, unspecified organism: Principal | ICD-10-CM

## 2020-02-10 DIAGNOSIS — B3789 Other sites of candidiasis: Principal | ICD-10-CM

## 2020-02-10 DIAGNOSIS — E440 Moderate protein-calorie malnutrition: Principal | ICD-10-CM

## 2020-02-10 DIAGNOSIS — G8929 Other chronic pain: Principal | ICD-10-CM

## 2020-02-10 DIAGNOSIS — D61810 Antineoplastic chemotherapy induced pancytopenia: Principal | ICD-10-CM

## 2020-02-10 DIAGNOSIS — F419 Anxiety disorder, unspecified: Principal | ICD-10-CM

## 2020-02-10 DIAGNOSIS — H90A21 Sensorineural hearing loss, unilateral, right ear, with restricted hearing on the contralateral side: Principal | ICD-10-CM

## 2020-02-10 DIAGNOSIS — A4102 Sepsis due to Methicillin resistant Staphylococcus aureus: Principal | ICD-10-CM

## 2020-02-10 DIAGNOSIS — G893 Neoplasm related pain (acute) (chronic): Principal | ICD-10-CM

## 2020-02-10 DIAGNOSIS — I10 Essential (primary) hypertension: Principal | ICD-10-CM

## 2020-02-10 DIAGNOSIS — M609 Myositis, unspecified: Principal | ICD-10-CM

## 2020-02-10 DIAGNOSIS — G928 Other toxic encephalopathy: Principal | ICD-10-CM

## 2020-02-10 DIAGNOSIS — B377 Candidal sepsis: Principal | ICD-10-CM

## 2020-02-10 DIAGNOSIS — R6521 Severe sepsis with septic shock: Principal | ICD-10-CM

## 2020-02-10 DIAGNOSIS — B958 Unspecified staphylococcus as the cause of diseases classified elsewhere: Principal | ICD-10-CM

## 2020-02-10 DIAGNOSIS — K1230 Oral mucositis (ulcerative), unspecified: Principal | ICD-10-CM

## 2020-02-10 DIAGNOSIS — E871 Hypo-osmolality and hyponatremia: Principal | ICD-10-CM

## 2020-02-10 DIAGNOSIS — E883 Tumor lysis syndrome: Principal | ICD-10-CM

## 2020-02-10 DIAGNOSIS — E875 Hyperkalemia: Principal | ICD-10-CM

## 2020-02-11 DIAGNOSIS — G8929 Other chronic pain: Principal | ICD-10-CM

## 2020-02-11 DIAGNOSIS — R21 Rash and other nonspecific skin eruption: Principal | ICD-10-CM

## 2020-02-11 DIAGNOSIS — Z20822 Contact with and (suspected) exposure to covid-19: Principal | ICD-10-CM

## 2020-02-11 DIAGNOSIS — I10 Essential (primary) hypertension: Principal | ICD-10-CM

## 2020-02-11 DIAGNOSIS — J189 Pneumonia, unspecified organism: Principal | ICD-10-CM

## 2020-02-11 DIAGNOSIS — A4102 Sepsis due to Methicillin resistant Staphylococcus aureus: Principal | ICD-10-CM

## 2020-02-11 DIAGNOSIS — J9601 Acute respiratory failure with hypoxia: Principal | ICD-10-CM

## 2020-02-11 DIAGNOSIS — E875 Hyperkalemia: Principal | ICD-10-CM

## 2020-02-11 DIAGNOSIS — E883 Tumor lysis syndrome: Principal | ICD-10-CM

## 2020-02-11 DIAGNOSIS — D6959 Other secondary thrombocytopenia: Principal | ICD-10-CM

## 2020-02-11 DIAGNOSIS — I330 Acute and subacute infective endocarditis: Principal | ICD-10-CM

## 2020-02-11 DIAGNOSIS — D61810 Antineoplastic chemotherapy induced pancytopenia: Principal | ICD-10-CM

## 2020-02-11 DIAGNOSIS — F32A Depression, unspecified: Principal | ICD-10-CM

## 2020-02-11 DIAGNOSIS — I76 Septic arterial embolism: Principal | ICD-10-CM

## 2020-02-11 DIAGNOSIS — M609 Myositis, unspecified: Principal | ICD-10-CM

## 2020-02-11 DIAGNOSIS — R531 Weakness: Principal | ICD-10-CM

## 2020-02-11 DIAGNOSIS — H90A21 Sensorineural hearing loss, unilateral, right ear, with restricted hearing on the contralateral side: Principal | ICD-10-CM

## 2020-02-11 DIAGNOSIS — L89153 Pressure ulcer of sacral region, stage 3: Principal | ICD-10-CM

## 2020-02-11 DIAGNOSIS — H81311 Aural vertigo, right ear: Principal | ICD-10-CM

## 2020-02-11 DIAGNOSIS — Z7409 Other reduced mobility: Principal | ICD-10-CM

## 2020-02-11 DIAGNOSIS — L89306 Pressure-induced deep tissue damage of unspecified buttock: Principal | ICD-10-CM

## 2020-02-11 DIAGNOSIS — R066 Hiccough: Principal | ICD-10-CM

## 2020-02-11 DIAGNOSIS — R6521 Severe sepsis with septic shock: Principal | ICD-10-CM

## 2020-02-11 DIAGNOSIS — K1230 Oral mucositis (ulcerative), unspecified: Principal | ICD-10-CM

## 2020-02-11 DIAGNOSIS — K5900 Constipation, unspecified: Principal | ICD-10-CM

## 2020-02-11 DIAGNOSIS — B377 Candidal sepsis: Principal | ICD-10-CM

## 2020-02-11 DIAGNOSIS — G893 Neoplasm related pain (acute) (chronic): Principal | ICD-10-CM

## 2020-02-11 DIAGNOSIS — B958 Unspecified staphylococcus as the cause of diseases classified elsewhere: Principal | ICD-10-CM

## 2020-02-11 DIAGNOSIS — N170 Acute kidney failure with tubular necrosis: Principal | ICD-10-CM

## 2020-02-11 DIAGNOSIS — E440 Moderate protein-calorie malnutrition: Principal | ICD-10-CM

## 2020-02-11 DIAGNOSIS — G4733 Obstructive sleep apnea (adult) (pediatric): Principal | ICD-10-CM

## 2020-02-11 DIAGNOSIS — M545 Low back pain, unspecified: Principal | ICD-10-CM

## 2020-02-11 DIAGNOSIS — C9100 Acute lymphoblastic leukemia not having achieved remission: Principal | ICD-10-CM

## 2020-02-11 DIAGNOSIS — G928 Other toxic encephalopathy: Principal | ICD-10-CM

## 2020-02-11 DIAGNOSIS — I313 Pericardial effusion (noninflammatory): Principal | ICD-10-CM

## 2020-02-11 DIAGNOSIS — F419 Anxiety disorder, unspecified: Principal | ICD-10-CM

## 2020-02-11 DIAGNOSIS — E871 Hypo-osmolality and hyponatremia: Principal | ICD-10-CM

## 2020-02-11 DIAGNOSIS — E872 Acidosis: Principal | ICD-10-CM

## 2020-02-11 DIAGNOSIS — B3789 Other sites of candidiasis: Principal | ICD-10-CM

## 2020-02-11 DIAGNOSIS — E870 Hyperosmolality and hypernatremia: Principal | ICD-10-CM

## 2020-02-11 DIAGNOSIS — R161 Splenomegaly, not elsewhere classified: Principal | ICD-10-CM

## 2020-02-12 DIAGNOSIS — I76 Septic arterial embolism: Principal | ICD-10-CM

## 2020-02-12 DIAGNOSIS — B377 Candidal sepsis: Principal | ICD-10-CM

## 2020-02-12 DIAGNOSIS — L89153 Pressure ulcer of sacral region, stage 3: Principal | ICD-10-CM

## 2020-02-12 DIAGNOSIS — K5900 Constipation, unspecified: Principal | ICD-10-CM

## 2020-02-12 DIAGNOSIS — D61810 Antineoplastic chemotherapy induced pancytopenia: Principal | ICD-10-CM

## 2020-02-12 DIAGNOSIS — F419 Anxiety disorder, unspecified: Principal | ICD-10-CM

## 2020-02-12 DIAGNOSIS — A4102 Sepsis due to Methicillin resistant Staphylococcus aureus: Principal | ICD-10-CM

## 2020-02-12 DIAGNOSIS — B3789 Other sites of candidiasis: Principal | ICD-10-CM

## 2020-02-12 DIAGNOSIS — Z20822 Contact with and (suspected) exposure to covid-19: Principal | ICD-10-CM

## 2020-02-12 DIAGNOSIS — E871 Hypo-osmolality and hyponatremia: Principal | ICD-10-CM

## 2020-02-12 DIAGNOSIS — M545 Low back pain, unspecified: Principal | ICD-10-CM

## 2020-02-12 DIAGNOSIS — E872 Acidosis: Principal | ICD-10-CM

## 2020-02-12 DIAGNOSIS — R6521 Severe sepsis with septic shock: Principal | ICD-10-CM

## 2020-02-12 DIAGNOSIS — R21 Rash and other nonspecific skin eruption: Principal | ICD-10-CM

## 2020-02-12 DIAGNOSIS — L89306 Pressure-induced deep tissue damage of unspecified buttock: Principal | ICD-10-CM

## 2020-02-12 DIAGNOSIS — D6959 Other secondary thrombocytopenia: Principal | ICD-10-CM

## 2020-02-12 DIAGNOSIS — E440 Moderate protein-calorie malnutrition: Principal | ICD-10-CM

## 2020-02-12 DIAGNOSIS — E883 Tumor lysis syndrome: Principal | ICD-10-CM

## 2020-02-12 DIAGNOSIS — Z7409 Other reduced mobility: Principal | ICD-10-CM

## 2020-02-12 DIAGNOSIS — G893 Neoplasm related pain (acute) (chronic): Principal | ICD-10-CM

## 2020-02-12 DIAGNOSIS — R066 Hiccough: Principal | ICD-10-CM

## 2020-02-12 DIAGNOSIS — G8929 Other chronic pain: Principal | ICD-10-CM

## 2020-02-12 DIAGNOSIS — J189 Pneumonia, unspecified organism: Principal | ICD-10-CM

## 2020-02-12 DIAGNOSIS — M609 Myositis, unspecified: Principal | ICD-10-CM

## 2020-02-12 DIAGNOSIS — C9100 Acute lymphoblastic leukemia not having achieved remission: Principal | ICD-10-CM

## 2020-02-12 DIAGNOSIS — N170 Acute kidney failure with tubular necrosis: Principal | ICD-10-CM

## 2020-02-12 DIAGNOSIS — I330 Acute and subacute infective endocarditis: Principal | ICD-10-CM

## 2020-02-12 DIAGNOSIS — G928 Other toxic encephalopathy: Principal | ICD-10-CM

## 2020-02-12 DIAGNOSIS — I10 Essential (primary) hypertension: Principal | ICD-10-CM

## 2020-02-12 DIAGNOSIS — E875 Hyperkalemia: Principal | ICD-10-CM

## 2020-02-12 DIAGNOSIS — H90A21 Sensorineural hearing loss, unilateral, right ear, with restricted hearing on the contralateral side: Principal | ICD-10-CM

## 2020-02-12 DIAGNOSIS — J9601 Acute respiratory failure with hypoxia: Principal | ICD-10-CM

## 2020-02-12 DIAGNOSIS — R531 Weakness: Principal | ICD-10-CM

## 2020-02-12 DIAGNOSIS — F32A Depression, unspecified: Principal | ICD-10-CM

## 2020-02-12 DIAGNOSIS — H81311 Aural vertigo, right ear: Principal | ICD-10-CM

## 2020-02-12 DIAGNOSIS — R161 Splenomegaly, not elsewhere classified: Principal | ICD-10-CM

## 2020-02-12 DIAGNOSIS — B958 Unspecified staphylococcus as the cause of diseases classified elsewhere: Principal | ICD-10-CM

## 2020-02-12 DIAGNOSIS — E870 Hyperosmolality and hypernatremia: Principal | ICD-10-CM

## 2020-02-12 DIAGNOSIS — G4733 Obstructive sleep apnea (adult) (pediatric): Principal | ICD-10-CM

## 2020-02-12 DIAGNOSIS — I313 Pericardial effusion (noninflammatory): Principal | ICD-10-CM

## 2020-02-12 DIAGNOSIS — K1230 Oral mucositis (ulcerative), unspecified: Principal | ICD-10-CM

## 2020-02-13 DIAGNOSIS — I10 Essential (primary) hypertension: Principal | ICD-10-CM

## 2020-02-13 DIAGNOSIS — E440 Moderate protein-calorie malnutrition: Principal | ICD-10-CM

## 2020-02-13 DIAGNOSIS — F419 Anxiety disorder, unspecified: Principal | ICD-10-CM

## 2020-02-13 DIAGNOSIS — L89153 Pressure ulcer of sacral region, stage 3: Principal | ICD-10-CM

## 2020-02-13 DIAGNOSIS — G4733 Obstructive sleep apnea (adult) (pediatric): Principal | ICD-10-CM

## 2020-02-13 DIAGNOSIS — K1230 Oral mucositis (ulcerative), unspecified: Principal | ICD-10-CM

## 2020-02-13 DIAGNOSIS — G928 Other toxic encephalopathy: Principal | ICD-10-CM

## 2020-02-13 DIAGNOSIS — H81311 Aural vertigo, right ear: Principal | ICD-10-CM

## 2020-02-13 DIAGNOSIS — J189 Pneumonia, unspecified organism: Principal | ICD-10-CM

## 2020-02-13 DIAGNOSIS — D6959 Other secondary thrombocytopenia: Principal | ICD-10-CM

## 2020-02-13 DIAGNOSIS — H90A21 Sensorineural hearing loss, unilateral, right ear, with restricted hearing on the contralateral side: Principal | ICD-10-CM

## 2020-02-13 DIAGNOSIS — L89306 Pressure-induced deep tissue damage of unspecified buttock: Principal | ICD-10-CM

## 2020-02-13 DIAGNOSIS — R6521 Severe sepsis with septic shock: Principal | ICD-10-CM

## 2020-02-13 DIAGNOSIS — E883 Tumor lysis syndrome: Principal | ICD-10-CM

## 2020-02-13 DIAGNOSIS — N170 Acute kidney failure with tubular necrosis: Principal | ICD-10-CM

## 2020-02-13 DIAGNOSIS — G893 Neoplasm related pain (acute) (chronic): Principal | ICD-10-CM

## 2020-02-13 DIAGNOSIS — D61810 Antineoplastic chemotherapy induced pancytopenia: Principal | ICD-10-CM

## 2020-02-13 DIAGNOSIS — R21 Rash and other nonspecific skin eruption: Principal | ICD-10-CM

## 2020-02-13 DIAGNOSIS — C9100 Acute lymphoblastic leukemia not having achieved remission: Principal | ICD-10-CM

## 2020-02-13 DIAGNOSIS — M545 Low back pain, unspecified: Principal | ICD-10-CM

## 2020-02-13 DIAGNOSIS — I76 Septic arterial embolism: Principal | ICD-10-CM

## 2020-02-13 DIAGNOSIS — I313 Pericardial effusion (noninflammatory): Principal | ICD-10-CM

## 2020-02-13 DIAGNOSIS — B377 Candidal sepsis: Principal | ICD-10-CM

## 2020-02-13 DIAGNOSIS — B3789 Other sites of candidiasis: Principal | ICD-10-CM

## 2020-02-13 DIAGNOSIS — A4102 Sepsis due to Methicillin resistant Staphylococcus aureus: Principal | ICD-10-CM

## 2020-02-13 DIAGNOSIS — E871 Hypo-osmolality and hyponatremia: Principal | ICD-10-CM

## 2020-02-13 DIAGNOSIS — R161 Splenomegaly, not elsewhere classified: Principal | ICD-10-CM

## 2020-02-13 DIAGNOSIS — I330 Acute and subacute infective endocarditis: Principal | ICD-10-CM

## 2020-02-13 DIAGNOSIS — E875 Hyperkalemia: Principal | ICD-10-CM

## 2020-02-13 DIAGNOSIS — J9601 Acute respiratory failure with hypoxia: Principal | ICD-10-CM

## 2020-02-13 DIAGNOSIS — F32A Depression, unspecified: Principal | ICD-10-CM

## 2020-02-13 DIAGNOSIS — K5900 Constipation, unspecified: Principal | ICD-10-CM

## 2020-02-13 DIAGNOSIS — E872 Acidosis: Principal | ICD-10-CM

## 2020-02-13 DIAGNOSIS — R531 Weakness: Principal | ICD-10-CM

## 2020-02-13 DIAGNOSIS — E870 Hyperosmolality and hypernatremia: Principal | ICD-10-CM

## 2020-02-13 DIAGNOSIS — Z20822 Contact with and (suspected) exposure to covid-19: Principal | ICD-10-CM

## 2020-02-13 DIAGNOSIS — Z7409 Other reduced mobility: Principal | ICD-10-CM

## 2020-02-13 DIAGNOSIS — B958 Unspecified staphylococcus as the cause of diseases classified elsewhere: Principal | ICD-10-CM

## 2020-02-13 DIAGNOSIS — M609 Myositis, unspecified: Principal | ICD-10-CM

## 2020-02-13 DIAGNOSIS — R066 Hiccough: Principal | ICD-10-CM

## 2020-02-13 DIAGNOSIS — G8929 Other chronic pain: Principal | ICD-10-CM

## 2020-02-14 DIAGNOSIS — F419 Anxiety disorder, unspecified: Principal | ICD-10-CM

## 2020-02-14 DIAGNOSIS — R6521 Severe sepsis with septic shock: Principal | ICD-10-CM

## 2020-02-14 DIAGNOSIS — R531 Weakness: Principal | ICD-10-CM

## 2020-02-14 DIAGNOSIS — I10 Essential (primary) hypertension: Principal | ICD-10-CM

## 2020-02-14 DIAGNOSIS — I330 Acute and subacute infective endocarditis: Principal | ICD-10-CM

## 2020-02-14 DIAGNOSIS — G928 Other toxic encephalopathy: Principal | ICD-10-CM

## 2020-02-14 DIAGNOSIS — E440 Moderate protein-calorie malnutrition: Principal | ICD-10-CM

## 2020-02-14 DIAGNOSIS — E875 Hyperkalemia: Principal | ICD-10-CM

## 2020-02-14 DIAGNOSIS — B958 Unspecified staphylococcus as the cause of diseases classified elsewhere: Principal | ICD-10-CM

## 2020-02-14 DIAGNOSIS — H90A21 Sensorineural hearing loss, unilateral, right ear, with restricted hearing on the contralateral side: Principal | ICD-10-CM

## 2020-02-14 DIAGNOSIS — F32A Depression, unspecified: Principal | ICD-10-CM

## 2020-02-14 DIAGNOSIS — G4733 Obstructive sleep apnea (adult) (pediatric): Principal | ICD-10-CM

## 2020-02-14 DIAGNOSIS — E870 Hyperosmolality and hypernatremia: Principal | ICD-10-CM

## 2020-02-14 DIAGNOSIS — M545 Low back pain, unspecified: Principal | ICD-10-CM

## 2020-02-14 DIAGNOSIS — M609 Myositis, unspecified: Principal | ICD-10-CM

## 2020-02-14 DIAGNOSIS — E872 Acidosis: Principal | ICD-10-CM

## 2020-02-14 DIAGNOSIS — E871 Hypo-osmolality and hyponatremia: Principal | ICD-10-CM

## 2020-02-14 DIAGNOSIS — D61810 Antineoplastic chemotherapy induced pancytopenia: Principal | ICD-10-CM

## 2020-02-14 DIAGNOSIS — Z7409 Other reduced mobility: Principal | ICD-10-CM

## 2020-02-14 DIAGNOSIS — R161 Splenomegaly, not elsewhere classified: Principal | ICD-10-CM

## 2020-02-14 DIAGNOSIS — L89306 Pressure-induced deep tissue damage of unspecified buttock: Principal | ICD-10-CM

## 2020-02-14 DIAGNOSIS — H81311 Aural vertigo, right ear: Principal | ICD-10-CM

## 2020-02-14 DIAGNOSIS — G8929 Other chronic pain: Principal | ICD-10-CM

## 2020-02-14 DIAGNOSIS — E883 Tumor lysis syndrome: Principal | ICD-10-CM

## 2020-02-14 DIAGNOSIS — C9100 Acute lymphoblastic leukemia not having achieved remission: Principal | ICD-10-CM

## 2020-02-14 DIAGNOSIS — A4102 Sepsis due to Methicillin resistant Staphylococcus aureus: Principal | ICD-10-CM

## 2020-02-14 DIAGNOSIS — K5900 Constipation, unspecified: Principal | ICD-10-CM

## 2020-02-14 DIAGNOSIS — Z20822 Contact with and (suspected) exposure to covid-19: Principal | ICD-10-CM

## 2020-02-14 DIAGNOSIS — N170 Acute kidney failure with tubular necrosis: Principal | ICD-10-CM

## 2020-02-14 DIAGNOSIS — I313 Pericardial effusion (noninflammatory): Principal | ICD-10-CM

## 2020-02-14 DIAGNOSIS — G893 Neoplasm related pain (acute) (chronic): Principal | ICD-10-CM

## 2020-02-14 DIAGNOSIS — D6959 Other secondary thrombocytopenia: Principal | ICD-10-CM

## 2020-02-14 DIAGNOSIS — I76 Septic arterial embolism: Principal | ICD-10-CM

## 2020-02-14 DIAGNOSIS — J9601 Acute respiratory failure with hypoxia: Principal | ICD-10-CM

## 2020-02-14 DIAGNOSIS — L89153 Pressure ulcer of sacral region, stage 3: Principal | ICD-10-CM

## 2020-02-14 DIAGNOSIS — R066 Hiccough: Principal | ICD-10-CM

## 2020-02-14 DIAGNOSIS — B377 Candidal sepsis: Principal | ICD-10-CM

## 2020-02-14 DIAGNOSIS — R21 Rash and other nonspecific skin eruption: Principal | ICD-10-CM

## 2020-02-14 DIAGNOSIS — K1230 Oral mucositis (ulcerative), unspecified: Principal | ICD-10-CM

## 2020-02-14 DIAGNOSIS — B3789 Other sites of candidiasis: Principal | ICD-10-CM

## 2020-02-14 DIAGNOSIS — J189 Pneumonia, unspecified organism: Principal | ICD-10-CM

## 2020-02-15 DIAGNOSIS — B3789 Other sites of candidiasis: Principal | ICD-10-CM

## 2020-02-15 DIAGNOSIS — D6959 Other secondary thrombocytopenia: Principal | ICD-10-CM

## 2020-02-15 DIAGNOSIS — R161 Splenomegaly, not elsewhere classified: Principal | ICD-10-CM

## 2020-02-15 DIAGNOSIS — F32A Depression, unspecified: Principal | ICD-10-CM

## 2020-02-15 DIAGNOSIS — Z20822 Contact with and (suspected) exposure to covid-19: Principal | ICD-10-CM

## 2020-02-15 DIAGNOSIS — G8929 Other chronic pain: Principal | ICD-10-CM

## 2020-02-15 DIAGNOSIS — L89306 Pressure-induced deep tissue damage of unspecified buttock: Principal | ICD-10-CM

## 2020-02-15 DIAGNOSIS — G893 Neoplasm related pain (acute) (chronic): Principal | ICD-10-CM

## 2020-02-15 DIAGNOSIS — M545 Low back pain, unspecified: Principal | ICD-10-CM

## 2020-02-15 DIAGNOSIS — J9601 Acute respiratory failure with hypoxia: Principal | ICD-10-CM

## 2020-02-15 DIAGNOSIS — E872 Acidosis: Principal | ICD-10-CM

## 2020-02-15 DIAGNOSIS — F419 Anxiety disorder, unspecified: Principal | ICD-10-CM

## 2020-02-15 DIAGNOSIS — G928 Other toxic encephalopathy: Principal | ICD-10-CM

## 2020-02-15 DIAGNOSIS — Z7409 Other reduced mobility: Principal | ICD-10-CM

## 2020-02-15 DIAGNOSIS — D61810 Antineoplastic chemotherapy induced pancytopenia: Principal | ICD-10-CM

## 2020-02-15 DIAGNOSIS — E883 Tumor lysis syndrome: Principal | ICD-10-CM

## 2020-02-15 DIAGNOSIS — C9100 Acute lymphoblastic leukemia not having achieved remission: Principal | ICD-10-CM

## 2020-02-15 DIAGNOSIS — E871 Hypo-osmolality and hyponatremia: Principal | ICD-10-CM

## 2020-02-15 DIAGNOSIS — K5900 Constipation, unspecified: Principal | ICD-10-CM

## 2020-02-15 DIAGNOSIS — A4102 Sepsis due to Methicillin resistant Staphylococcus aureus: Principal | ICD-10-CM

## 2020-02-15 DIAGNOSIS — R531 Weakness: Principal | ICD-10-CM

## 2020-02-15 DIAGNOSIS — G4733 Obstructive sleep apnea (adult) (pediatric): Principal | ICD-10-CM

## 2020-02-15 DIAGNOSIS — R21 Rash and other nonspecific skin eruption: Principal | ICD-10-CM

## 2020-02-15 DIAGNOSIS — I10 Essential (primary) hypertension: Principal | ICD-10-CM

## 2020-02-15 DIAGNOSIS — E870 Hyperosmolality and hypernatremia: Principal | ICD-10-CM

## 2020-02-15 DIAGNOSIS — I313 Pericardial effusion (noninflammatory): Principal | ICD-10-CM

## 2020-02-15 DIAGNOSIS — R066 Hiccough: Principal | ICD-10-CM

## 2020-02-15 DIAGNOSIS — E440 Moderate protein-calorie malnutrition: Principal | ICD-10-CM

## 2020-02-15 DIAGNOSIS — B377 Candidal sepsis: Principal | ICD-10-CM

## 2020-02-15 DIAGNOSIS — K1230 Oral mucositis (ulcerative), unspecified: Principal | ICD-10-CM

## 2020-02-15 DIAGNOSIS — M609 Myositis, unspecified: Principal | ICD-10-CM

## 2020-02-15 DIAGNOSIS — I330 Acute and subacute infective endocarditis: Principal | ICD-10-CM

## 2020-02-15 DIAGNOSIS — J189 Pneumonia, unspecified organism: Principal | ICD-10-CM

## 2020-02-15 DIAGNOSIS — E875 Hyperkalemia: Principal | ICD-10-CM

## 2020-02-15 DIAGNOSIS — R6521 Severe sepsis with septic shock: Principal | ICD-10-CM

## 2020-02-15 DIAGNOSIS — N170 Acute kidney failure with tubular necrosis: Principal | ICD-10-CM

## 2020-02-15 DIAGNOSIS — B958 Unspecified staphylococcus as the cause of diseases classified elsewhere: Principal | ICD-10-CM

## 2020-02-15 DIAGNOSIS — L89153 Pressure ulcer of sacral region, stage 3: Principal | ICD-10-CM

## 2020-02-15 DIAGNOSIS — I76 Septic arterial embolism: Principal | ICD-10-CM

## 2020-02-15 DIAGNOSIS — H81311 Aural vertigo, right ear: Principal | ICD-10-CM

## 2020-02-15 DIAGNOSIS — H90A21 Sensorineural hearing loss, unilateral, right ear, with restricted hearing on the contralateral side: Principal | ICD-10-CM

## 2020-02-17 DIAGNOSIS — L89153 Pressure ulcer of sacral region, stage 3: Principal | ICD-10-CM

## 2020-02-17 DIAGNOSIS — I313 Pericardial effusion (noninflammatory): Principal | ICD-10-CM

## 2020-02-17 DIAGNOSIS — R161 Splenomegaly, not elsewhere classified: Principal | ICD-10-CM

## 2020-02-17 DIAGNOSIS — E883 Tumor lysis syndrome: Principal | ICD-10-CM

## 2020-02-17 DIAGNOSIS — E870 Hyperosmolality and hypernatremia: Principal | ICD-10-CM

## 2020-02-17 DIAGNOSIS — I330 Acute and subacute infective endocarditis: Principal | ICD-10-CM

## 2020-02-17 DIAGNOSIS — L89306 Pressure-induced deep tissue damage of unspecified buttock: Principal | ICD-10-CM

## 2020-02-17 DIAGNOSIS — E875 Hyperkalemia: Principal | ICD-10-CM

## 2020-02-17 DIAGNOSIS — N170 Acute kidney failure with tubular necrosis: Principal | ICD-10-CM

## 2020-02-17 DIAGNOSIS — G928 Other toxic encephalopathy: Principal | ICD-10-CM

## 2020-02-17 DIAGNOSIS — H90A21 Sensorineural hearing loss, unilateral, right ear, with restricted hearing on the contralateral side: Principal | ICD-10-CM

## 2020-02-17 DIAGNOSIS — M609 Myositis, unspecified: Principal | ICD-10-CM

## 2020-02-17 DIAGNOSIS — C9100 Acute lymphoblastic leukemia not having achieved remission: Principal | ICD-10-CM

## 2020-02-17 DIAGNOSIS — A4102 Sepsis due to Methicillin resistant Staphylococcus aureus: Principal | ICD-10-CM

## 2020-02-17 DIAGNOSIS — G4733 Obstructive sleep apnea (adult) (pediatric): Principal | ICD-10-CM

## 2020-02-17 DIAGNOSIS — B377 Candidal sepsis: Principal | ICD-10-CM

## 2020-02-17 DIAGNOSIS — M545 Low back pain, unspecified: Principal | ICD-10-CM

## 2020-02-17 DIAGNOSIS — I10 Essential (primary) hypertension: Principal | ICD-10-CM

## 2020-02-17 DIAGNOSIS — D61810 Antineoplastic chemotherapy induced pancytopenia: Principal | ICD-10-CM

## 2020-02-17 DIAGNOSIS — E871 Hypo-osmolality and hyponatremia: Principal | ICD-10-CM

## 2020-02-17 DIAGNOSIS — G893 Neoplasm related pain (acute) (chronic): Principal | ICD-10-CM

## 2020-02-17 DIAGNOSIS — J189 Pneumonia, unspecified organism: Principal | ICD-10-CM

## 2020-02-17 DIAGNOSIS — B3789 Other sites of candidiasis: Principal | ICD-10-CM

## 2020-02-17 DIAGNOSIS — B958 Unspecified staphylococcus as the cause of diseases classified elsewhere: Principal | ICD-10-CM

## 2020-02-17 DIAGNOSIS — Z20822 Contact with and (suspected) exposure to covid-19: Principal | ICD-10-CM

## 2020-02-17 DIAGNOSIS — F32A Depression, unspecified: Principal | ICD-10-CM

## 2020-02-17 DIAGNOSIS — R21 Rash and other nonspecific skin eruption: Principal | ICD-10-CM

## 2020-02-17 DIAGNOSIS — Z7409 Other reduced mobility: Principal | ICD-10-CM

## 2020-02-17 DIAGNOSIS — G8929 Other chronic pain: Principal | ICD-10-CM

## 2020-02-17 DIAGNOSIS — R066 Hiccough: Principal | ICD-10-CM

## 2020-02-17 DIAGNOSIS — F419 Anxiety disorder, unspecified: Principal | ICD-10-CM

## 2020-02-17 DIAGNOSIS — E872 Acidosis: Principal | ICD-10-CM

## 2020-02-17 DIAGNOSIS — H81311 Aural vertigo, right ear: Principal | ICD-10-CM

## 2020-02-17 DIAGNOSIS — J9601 Acute respiratory failure with hypoxia: Principal | ICD-10-CM

## 2020-02-17 DIAGNOSIS — I76 Septic arterial embolism: Principal | ICD-10-CM

## 2020-02-17 DIAGNOSIS — E440 Moderate protein-calorie malnutrition: Principal | ICD-10-CM

## 2020-02-17 DIAGNOSIS — D6959 Other secondary thrombocytopenia: Principal | ICD-10-CM

## 2020-02-17 DIAGNOSIS — R531 Weakness: Principal | ICD-10-CM

## 2020-02-17 DIAGNOSIS — K5900 Constipation, unspecified: Principal | ICD-10-CM

## 2020-02-17 DIAGNOSIS — K1230 Oral mucositis (ulcerative), unspecified: Principal | ICD-10-CM

## 2020-02-17 DIAGNOSIS — R6521 Severe sepsis with septic shock: Principal | ICD-10-CM

## 2020-02-20 DIAGNOSIS — E875 Hyperkalemia: Principal | ICD-10-CM

## 2020-02-20 DIAGNOSIS — G8929 Other chronic pain: Principal | ICD-10-CM

## 2020-02-20 DIAGNOSIS — E872 Acidosis: Principal | ICD-10-CM

## 2020-02-20 DIAGNOSIS — K5900 Constipation, unspecified: Principal | ICD-10-CM

## 2020-02-20 DIAGNOSIS — J189 Pneumonia, unspecified organism: Principal | ICD-10-CM

## 2020-02-20 DIAGNOSIS — I313 Pericardial effusion (noninflammatory): Principal | ICD-10-CM

## 2020-02-20 DIAGNOSIS — E871 Hypo-osmolality and hyponatremia: Principal | ICD-10-CM

## 2020-02-20 DIAGNOSIS — L89153 Pressure ulcer of sacral region, stage 3: Principal | ICD-10-CM

## 2020-02-20 DIAGNOSIS — L89306 Pressure-induced deep tissue damage of unspecified buttock: Principal | ICD-10-CM

## 2020-02-20 DIAGNOSIS — G928 Other toxic encephalopathy: Principal | ICD-10-CM

## 2020-02-20 DIAGNOSIS — I10 Essential (primary) hypertension: Principal | ICD-10-CM

## 2020-02-20 DIAGNOSIS — F32A Depression, unspecified: Principal | ICD-10-CM

## 2020-02-20 DIAGNOSIS — G4733 Obstructive sleep apnea (adult) (pediatric): Principal | ICD-10-CM

## 2020-02-20 DIAGNOSIS — Z20822 Contact with and (suspected) exposure to covid-19: Principal | ICD-10-CM

## 2020-02-20 DIAGNOSIS — E883 Tumor lysis syndrome: Principal | ICD-10-CM

## 2020-02-20 DIAGNOSIS — I330 Acute and subacute infective endocarditis: Principal | ICD-10-CM

## 2020-02-20 DIAGNOSIS — N170 Acute kidney failure with tubular necrosis: Principal | ICD-10-CM

## 2020-02-20 DIAGNOSIS — R066 Hiccough: Principal | ICD-10-CM

## 2020-02-20 DIAGNOSIS — B377 Candidal sepsis: Principal | ICD-10-CM

## 2020-02-20 DIAGNOSIS — E440 Moderate protein-calorie malnutrition: Principal | ICD-10-CM

## 2020-02-20 DIAGNOSIS — R161 Splenomegaly, not elsewhere classified: Principal | ICD-10-CM

## 2020-02-20 DIAGNOSIS — B3789 Other sites of candidiasis: Principal | ICD-10-CM

## 2020-02-20 DIAGNOSIS — J9601 Acute respiratory failure with hypoxia: Principal | ICD-10-CM

## 2020-02-20 DIAGNOSIS — M545 Low back pain, unspecified: Principal | ICD-10-CM

## 2020-02-20 DIAGNOSIS — Z7409 Other reduced mobility: Principal | ICD-10-CM

## 2020-02-20 DIAGNOSIS — H81311 Aural vertigo, right ear: Principal | ICD-10-CM

## 2020-02-20 DIAGNOSIS — K1230 Oral mucositis (ulcerative), unspecified: Principal | ICD-10-CM

## 2020-02-20 DIAGNOSIS — B958 Unspecified staphylococcus as the cause of diseases classified elsewhere: Principal | ICD-10-CM

## 2020-02-20 DIAGNOSIS — R531 Weakness: Principal | ICD-10-CM

## 2020-02-20 DIAGNOSIS — D6959 Other secondary thrombocytopenia: Principal | ICD-10-CM

## 2020-02-20 DIAGNOSIS — I76 Septic arterial embolism: Principal | ICD-10-CM

## 2020-02-20 DIAGNOSIS — R6521 Severe sepsis with septic shock: Principal | ICD-10-CM

## 2020-02-20 DIAGNOSIS — E870 Hyperosmolality and hypernatremia: Principal | ICD-10-CM

## 2020-02-20 DIAGNOSIS — H90A21 Sensorineural hearing loss, unilateral, right ear, with restricted hearing on the contralateral side: Principal | ICD-10-CM

## 2020-02-20 DIAGNOSIS — F419 Anxiety disorder, unspecified: Principal | ICD-10-CM

## 2020-02-20 DIAGNOSIS — A4102 Sepsis due to Methicillin resistant Staphylococcus aureus: Principal | ICD-10-CM

## 2020-02-20 DIAGNOSIS — M609 Myositis, unspecified: Principal | ICD-10-CM

## 2020-02-20 DIAGNOSIS — D61810 Antineoplastic chemotherapy induced pancytopenia: Principal | ICD-10-CM

## 2020-02-20 DIAGNOSIS — C9100 Acute lymphoblastic leukemia not having achieved remission: Principal | ICD-10-CM

## 2020-02-20 DIAGNOSIS — R21 Rash and other nonspecific skin eruption: Principal | ICD-10-CM

## 2020-02-20 DIAGNOSIS — G893 Neoplasm related pain (acute) (chronic): Principal | ICD-10-CM

## 2020-02-21 DIAGNOSIS — J9601 Acute respiratory failure with hypoxia: Principal | ICD-10-CM

## 2020-02-21 DIAGNOSIS — B3789 Other sites of candidiasis: Principal | ICD-10-CM

## 2020-02-21 DIAGNOSIS — E872 Acidosis: Principal | ICD-10-CM

## 2020-02-21 DIAGNOSIS — A4102 Sepsis due to Methicillin resistant Staphylococcus aureus: Principal | ICD-10-CM

## 2020-02-21 DIAGNOSIS — G928 Other toxic encephalopathy: Principal | ICD-10-CM

## 2020-02-21 DIAGNOSIS — D61810 Antineoplastic chemotherapy induced pancytopenia: Principal | ICD-10-CM

## 2020-02-21 DIAGNOSIS — I10 Essential (primary) hypertension: Principal | ICD-10-CM

## 2020-02-21 DIAGNOSIS — I76 Septic arterial embolism: Principal | ICD-10-CM

## 2020-02-21 DIAGNOSIS — R21 Rash and other nonspecific skin eruption: Principal | ICD-10-CM

## 2020-02-21 DIAGNOSIS — Z7409 Other reduced mobility: Principal | ICD-10-CM

## 2020-02-21 DIAGNOSIS — H81311 Aural vertigo, right ear: Principal | ICD-10-CM

## 2020-02-21 DIAGNOSIS — E875 Hyperkalemia: Principal | ICD-10-CM

## 2020-02-21 DIAGNOSIS — L89306 Pressure-induced deep tissue damage of unspecified buttock: Principal | ICD-10-CM

## 2020-02-21 DIAGNOSIS — R6521 Severe sepsis with septic shock: Principal | ICD-10-CM

## 2020-02-21 DIAGNOSIS — E440 Moderate protein-calorie malnutrition: Principal | ICD-10-CM

## 2020-02-21 DIAGNOSIS — E871 Hypo-osmolality and hyponatremia: Principal | ICD-10-CM

## 2020-02-21 DIAGNOSIS — Z20822 Contact with and (suspected) exposure to covid-19: Principal | ICD-10-CM

## 2020-02-21 DIAGNOSIS — H90A21 Sensorineural hearing loss, unilateral, right ear, with restricted hearing on the contralateral side: Principal | ICD-10-CM

## 2020-02-21 DIAGNOSIS — R066 Hiccough: Principal | ICD-10-CM

## 2020-02-21 DIAGNOSIS — R161 Splenomegaly, not elsewhere classified: Principal | ICD-10-CM

## 2020-02-21 DIAGNOSIS — G4733 Obstructive sleep apnea (adult) (pediatric): Principal | ICD-10-CM

## 2020-02-21 DIAGNOSIS — C9100 Acute lymphoblastic leukemia not having achieved remission: Principal | ICD-10-CM

## 2020-02-21 DIAGNOSIS — N170 Acute kidney failure with tubular necrosis: Principal | ICD-10-CM

## 2020-02-21 DIAGNOSIS — K5900 Constipation, unspecified: Principal | ICD-10-CM

## 2020-02-21 DIAGNOSIS — D6959 Other secondary thrombocytopenia: Principal | ICD-10-CM

## 2020-02-21 DIAGNOSIS — K1230 Oral mucositis (ulcerative), unspecified: Principal | ICD-10-CM

## 2020-02-21 DIAGNOSIS — F32A Depression, unspecified: Principal | ICD-10-CM

## 2020-02-21 DIAGNOSIS — F419 Anxiety disorder, unspecified: Principal | ICD-10-CM

## 2020-02-21 DIAGNOSIS — G8929 Other chronic pain: Principal | ICD-10-CM

## 2020-02-21 DIAGNOSIS — E883 Tumor lysis syndrome: Principal | ICD-10-CM

## 2020-02-21 DIAGNOSIS — M609 Myositis, unspecified: Principal | ICD-10-CM

## 2020-02-21 DIAGNOSIS — M545 Low back pain, unspecified: Principal | ICD-10-CM

## 2020-02-21 DIAGNOSIS — J189 Pneumonia, unspecified organism: Principal | ICD-10-CM

## 2020-02-21 DIAGNOSIS — L89153 Pressure ulcer of sacral region, stage 3: Principal | ICD-10-CM

## 2020-02-21 DIAGNOSIS — B958 Unspecified staphylococcus as the cause of diseases classified elsewhere: Principal | ICD-10-CM

## 2020-02-21 DIAGNOSIS — G893 Neoplasm related pain (acute) (chronic): Principal | ICD-10-CM

## 2020-02-21 DIAGNOSIS — R531 Weakness: Principal | ICD-10-CM

## 2020-02-21 DIAGNOSIS — I330 Acute and subacute infective endocarditis: Principal | ICD-10-CM

## 2020-02-21 DIAGNOSIS — B377 Candidal sepsis: Principal | ICD-10-CM

## 2020-02-21 DIAGNOSIS — E870 Hyperosmolality and hypernatremia: Principal | ICD-10-CM

## 2020-02-21 DIAGNOSIS — I313 Pericardial effusion (noninflammatory): Principal | ICD-10-CM

## 2020-02-23 DIAGNOSIS — L89306 Pressure-induced deep tissue damage of unspecified buttock: Principal | ICD-10-CM

## 2020-02-23 DIAGNOSIS — E875 Hyperkalemia: Principal | ICD-10-CM

## 2020-02-23 DIAGNOSIS — R6521 Severe sepsis with septic shock: Principal | ICD-10-CM

## 2020-02-23 DIAGNOSIS — E872 Acidosis: Principal | ICD-10-CM

## 2020-02-23 DIAGNOSIS — F419 Anxiety disorder, unspecified: Principal | ICD-10-CM

## 2020-02-23 DIAGNOSIS — E871 Hypo-osmolality and hyponatremia: Principal | ICD-10-CM

## 2020-02-23 DIAGNOSIS — E440 Moderate protein-calorie malnutrition: Principal | ICD-10-CM

## 2020-02-23 DIAGNOSIS — M609 Myositis, unspecified: Principal | ICD-10-CM

## 2020-02-23 DIAGNOSIS — G8929 Other chronic pain: Principal | ICD-10-CM

## 2020-02-23 DIAGNOSIS — B3789 Other sites of candidiasis: Principal | ICD-10-CM

## 2020-02-23 DIAGNOSIS — I10 Essential (primary) hypertension: Principal | ICD-10-CM

## 2020-02-23 DIAGNOSIS — E870 Hyperosmolality and hypernatremia: Principal | ICD-10-CM

## 2020-02-23 DIAGNOSIS — G4733 Obstructive sleep apnea (adult) (pediatric): Principal | ICD-10-CM

## 2020-02-23 DIAGNOSIS — G893 Neoplasm related pain (acute) (chronic): Principal | ICD-10-CM

## 2020-02-23 DIAGNOSIS — J9601 Acute respiratory failure with hypoxia: Principal | ICD-10-CM

## 2020-02-23 DIAGNOSIS — L89153 Pressure ulcer of sacral region, stage 3: Principal | ICD-10-CM

## 2020-02-23 DIAGNOSIS — I76 Septic arterial embolism: Principal | ICD-10-CM

## 2020-02-23 DIAGNOSIS — E883 Tumor lysis syndrome: Principal | ICD-10-CM

## 2020-02-23 DIAGNOSIS — H81311 Aural vertigo, right ear: Principal | ICD-10-CM

## 2020-02-23 DIAGNOSIS — A4102 Sepsis due to Methicillin resistant Staphylococcus aureus: Principal | ICD-10-CM

## 2020-02-23 DIAGNOSIS — D61810 Antineoplastic chemotherapy induced pancytopenia: Principal | ICD-10-CM

## 2020-02-23 DIAGNOSIS — R21 Rash and other nonspecific skin eruption: Principal | ICD-10-CM

## 2020-02-23 DIAGNOSIS — D6959 Other secondary thrombocytopenia: Principal | ICD-10-CM

## 2020-02-23 DIAGNOSIS — B958 Unspecified staphylococcus as the cause of diseases classified elsewhere: Principal | ICD-10-CM

## 2020-02-23 DIAGNOSIS — M545 Low back pain, unspecified: Principal | ICD-10-CM

## 2020-02-23 DIAGNOSIS — R066 Hiccough: Principal | ICD-10-CM

## 2020-02-23 DIAGNOSIS — R161 Splenomegaly, not elsewhere classified: Principal | ICD-10-CM

## 2020-02-23 DIAGNOSIS — K5900 Constipation, unspecified: Principal | ICD-10-CM

## 2020-02-23 DIAGNOSIS — K1230 Oral mucositis (ulcerative), unspecified: Principal | ICD-10-CM

## 2020-02-23 DIAGNOSIS — N170 Acute kidney failure with tubular necrosis: Principal | ICD-10-CM

## 2020-02-23 DIAGNOSIS — J189 Pneumonia, unspecified organism: Principal | ICD-10-CM

## 2020-02-23 DIAGNOSIS — Z7409 Other reduced mobility: Principal | ICD-10-CM

## 2020-02-23 DIAGNOSIS — B377 Candidal sepsis: Principal | ICD-10-CM

## 2020-02-23 DIAGNOSIS — G928 Other toxic encephalopathy: Principal | ICD-10-CM

## 2020-02-23 DIAGNOSIS — I330 Acute and subacute infective endocarditis: Principal | ICD-10-CM

## 2020-02-23 DIAGNOSIS — Z20822 Contact with and (suspected) exposure to covid-19: Principal | ICD-10-CM

## 2020-02-23 DIAGNOSIS — H90A21 Sensorineural hearing loss, unilateral, right ear, with restricted hearing on the contralateral side: Principal | ICD-10-CM

## 2020-02-23 DIAGNOSIS — C9100 Acute lymphoblastic leukemia not having achieved remission: Principal | ICD-10-CM

## 2020-02-23 DIAGNOSIS — I313 Pericardial effusion (noninflammatory): Principal | ICD-10-CM

## 2020-02-23 DIAGNOSIS — F32A Depression, unspecified: Principal | ICD-10-CM

## 2020-02-23 DIAGNOSIS — R531 Weakness: Principal | ICD-10-CM

## 2020-02-24 DIAGNOSIS — R066 Hiccough: Principal | ICD-10-CM

## 2020-02-24 DIAGNOSIS — R21 Rash and other nonspecific skin eruption: Principal | ICD-10-CM

## 2020-02-24 DIAGNOSIS — G928 Other toxic encephalopathy: Principal | ICD-10-CM

## 2020-02-24 DIAGNOSIS — K1230 Oral mucositis (ulcerative), unspecified: Principal | ICD-10-CM

## 2020-02-24 DIAGNOSIS — I330 Acute and subacute infective endocarditis: Principal | ICD-10-CM

## 2020-02-24 DIAGNOSIS — C9100 Acute lymphoblastic leukemia not having achieved remission: Principal | ICD-10-CM

## 2020-02-24 DIAGNOSIS — D6959 Other secondary thrombocytopenia: Principal | ICD-10-CM

## 2020-02-24 DIAGNOSIS — R161 Splenomegaly, not elsewhere classified: Principal | ICD-10-CM

## 2020-02-24 DIAGNOSIS — D61810 Antineoplastic chemotherapy induced pancytopenia: Principal | ICD-10-CM

## 2020-02-24 DIAGNOSIS — E440 Moderate protein-calorie malnutrition: Principal | ICD-10-CM

## 2020-02-24 DIAGNOSIS — G8929 Other chronic pain: Principal | ICD-10-CM

## 2020-02-24 DIAGNOSIS — B3789 Other sites of candidiasis: Principal | ICD-10-CM

## 2020-02-24 DIAGNOSIS — L89306 Pressure-induced deep tissue damage of unspecified buttock: Principal | ICD-10-CM

## 2020-02-24 DIAGNOSIS — J9601 Acute respiratory failure with hypoxia: Principal | ICD-10-CM

## 2020-02-24 DIAGNOSIS — G4733 Obstructive sleep apnea (adult) (pediatric): Principal | ICD-10-CM

## 2020-02-24 DIAGNOSIS — I76 Septic arterial embolism: Principal | ICD-10-CM

## 2020-02-24 DIAGNOSIS — H81311 Aural vertigo, right ear: Principal | ICD-10-CM

## 2020-02-24 DIAGNOSIS — R531 Weakness: Principal | ICD-10-CM

## 2020-02-24 DIAGNOSIS — F419 Anxiety disorder, unspecified: Principal | ICD-10-CM

## 2020-02-24 DIAGNOSIS — M545 Low back pain, unspecified: Principal | ICD-10-CM

## 2020-02-24 DIAGNOSIS — F32A Depression, unspecified: Principal | ICD-10-CM

## 2020-02-24 DIAGNOSIS — I313 Pericardial effusion (noninflammatory): Principal | ICD-10-CM

## 2020-02-24 DIAGNOSIS — Z20822 Contact with and (suspected) exposure to covid-19: Principal | ICD-10-CM

## 2020-02-24 DIAGNOSIS — B377 Candidal sepsis: Principal | ICD-10-CM

## 2020-02-24 DIAGNOSIS — G893 Neoplasm related pain (acute) (chronic): Principal | ICD-10-CM

## 2020-02-24 DIAGNOSIS — R6521 Severe sepsis with septic shock: Principal | ICD-10-CM

## 2020-02-24 DIAGNOSIS — J189 Pneumonia, unspecified organism: Principal | ICD-10-CM

## 2020-02-24 DIAGNOSIS — B958 Unspecified staphylococcus as the cause of diseases classified elsewhere: Principal | ICD-10-CM

## 2020-02-24 DIAGNOSIS — E883 Tumor lysis syndrome: Principal | ICD-10-CM

## 2020-02-24 DIAGNOSIS — A4102 Sepsis due to Methicillin resistant Staphylococcus aureus: Principal | ICD-10-CM

## 2020-02-24 DIAGNOSIS — L89153 Pressure ulcer of sacral region, stage 3: Principal | ICD-10-CM

## 2020-02-24 DIAGNOSIS — M609 Myositis, unspecified: Principal | ICD-10-CM

## 2020-02-24 DIAGNOSIS — N170 Acute kidney failure with tubular necrosis: Principal | ICD-10-CM

## 2020-02-24 DIAGNOSIS — K5900 Constipation, unspecified: Principal | ICD-10-CM

## 2020-02-24 DIAGNOSIS — E872 Acidosis: Principal | ICD-10-CM

## 2020-02-24 DIAGNOSIS — H90A21 Sensorineural hearing loss, unilateral, right ear, with restricted hearing on the contralateral side: Principal | ICD-10-CM

## 2020-02-24 DIAGNOSIS — Z7409 Other reduced mobility: Principal | ICD-10-CM

## 2020-02-24 DIAGNOSIS — E871 Hypo-osmolality and hyponatremia: Principal | ICD-10-CM

## 2020-02-24 DIAGNOSIS — I10 Essential (primary) hypertension: Principal | ICD-10-CM

## 2020-02-24 DIAGNOSIS — E875 Hyperkalemia: Principal | ICD-10-CM

## 2020-02-24 DIAGNOSIS — E870 Hyperosmolality and hypernatremia: Principal | ICD-10-CM

## 2020-02-26 DIAGNOSIS — R531 Weakness: Principal | ICD-10-CM

## 2020-02-26 DIAGNOSIS — L89153 Pressure ulcer of sacral region, stage 3: Principal | ICD-10-CM

## 2020-02-26 DIAGNOSIS — B958 Unspecified staphylococcus as the cause of diseases classified elsewhere: Principal | ICD-10-CM

## 2020-02-26 DIAGNOSIS — G893 Neoplasm related pain (acute) (chronic): Principal | ICD-10-CM

## 2020-02-26 DIAGNOSIS — B377 Candidal sepsis: Principal | ICD-10-CM

## 2020-02-26 DIAGNOSIS — A4102 Sepsis due to Methicillin resistant Staphylococcus aureus: Principal | ICD-10-CM

## 2020-02-26 DIAGNOSIS — I10 Essential (primary) hypertension: Principal | ICD-10-CM

## 2020-02-26 DIAGNOSIS — B3789 Other sites of candidiasis: Principal | ICD-10-CM

## 2020-02-26 DIAGNOSIS — E872 Acidosis: Principal | ICD-10-CM

## 2020-02-26 DIAGNOSIS — R161 Splenomegaly, not elsewhere classified: Principal | ICD-10-CM

## 2020-02-26 DIAGNOSIS — F419 Anxiety disorder, unspecified: Principal | ICD-10-CM

## 2020-02-26 DIAGNOSIS — R6521 Severe sepsis with septic shock: Principal | ICD-10-CM

## 2020-02-26 DIAGNOSIS — J189 Pneumonia, unspecified organism: Principal | ICD-10-CM

## 2020-02-26 DIAGNOSIS — E440 Moderate protein-calorie malnutrition: Principal | ICD-10-CM

## 2020-02-26 DIAGNOSIS — K5900 Constipation, unspecified: Principal | ICD-10-CM

## 2020-02-26 DIAGNOSIS — J9601 Acute respiratory failure with hypoxia: Principal | ICD-10-CM

## 2020-02-26 DIAGNOSIS — L89306 Pressure-induced deep tissue damage of unspecified buttock: Principal | ICD-10-CM

## 2020-02-26 DIAGNOSIS — Z20822 Contact with and (suspected) exposure to covid-19: Principal | ICD-10-CM

## 2020-02-26 DIAGNOSIS — G8929 Other chronic pain: Principal | ICD-10-CM

## 2020-02-26 DIAGNOSIS — Z7409 Other reduced mobility: Principal | ICD-10-CM

## 2020-02-26 DIAGNOSIS — E875 Hyperkalemia: Principal | ICD-10-CM

## 2020-02-26 DIAGNOSIS — I76 Septic arterial embolism: Principal | ICD-10-CM

## 2020-02-26 DIAGNOSIS — I330 Acute and subacute infective endocarditis: Principal | ICD-10-CM

## 2020-02-26 DIAGNOSIS — I313 Pericardial effusion (noninflammatory): Principal | ICD-10-CM

## 2020-02-26 DIAGNOSIS — G928 Other toxic encephalopathy: Principal | ICD-10-CM

## 2020-02-26 DIAGNOSIS — G4733 Obstructive sleep apnea (adult) (pediatric): Principal | ICD-10-CM

## 2020-02-26 DIAGNOSIS — N170 Acute kidney failure with tubular necrosis: Principal | ICD-10-CM

## 2020-02-26 DIAGNOSIS — M545 Low back pain, unspecified: Principal | ICD-10-CM

## 2020-02-26 DIAGNOSIS — E870 Hyperosmolality and hypernatremia: Principal | ICD-10-CM

## 2020-02-26 DIAGNOSIS — D6959 Other secondary thrombocytopenia: Principal | ICD-10-CM

## 2020-02-26 DIAGNOSIS — D61810 Antineoplastic chemotherapy induced pancytopenia: Principal | ICD-10-CM

## 2020-02-26 DIAGNOSIS — F32A Depression, unspecified: Principal | ICD-10-CM

## 2020-02-26 DIAGNOSIS — M609 Myositis, unspecified: Principal | ICD-10-CM

## 2020-02-26 DIAGNOSIS — R066 Hiccough: Principal | ICD-10-CM

## 2020-02-26 DIAGNOSIS — E871 Hypo-osmolality and hyponatremia: Principal | ICD-10-CM

## 2020-02-26 DIAGNOSIS — H81311 Aural vertigo, right ear: Principal | ICD-10-CM

## 2020-02-26 DIAGNOSIS — C9100 Acute lymphoblastic leukemia not having achieved remission: Principal | ICD-10-CM

## 2020-02-26 DIAGNOSIS — H90A21 Sensorineural hearing loss, unilateral, right ear, with restricted hearing on the contralateral side: Principal | ICD-10-CM

## 2020-02-26 DIAGNOSIS — R21 Rash and other nonspecific skin eruption: Principal | ICD-10-CM

## 2020-02-26 DIAGNOSIS — E883 Tumor lysis syndrome: Principal | ICD-10-CM

## 2020-02-26 DIAGNOSIS — K1230 Oral mucositis (ulcerative), unspecified: Principal | ICD-10-CM

## 2020-03-02 DIAGNOSIS — K1230 Oral mucositis (ulcerative), unspecified: Principal | ICD-10-CM

## 2020-03-02 DIAGNOSIS — R21 Rash and other nonspecific skin eruption: Principal | ICD-10-CM

## 2020-03-02 DIAGNOSIS — H90A21 Sensorineural hearing loss, unilateral, right ear, with restricted hearing on the contralateral side: Principal | ICD-10-CM

## 2020-03-02 DIAGNOSIS — B3789 Other sites of candidiasis: Principal | ICD-10-CM

## 2020-03-02 DIAGNOSIS — F32A Depression, unspecified: Principal | ICD-10-CM

## 2020-03-02 DIAGNOSIS — I330 Acute and subacute infective endocarditis: Principal | ICD-10-CM

## 2020-03-02 DIAGNOSIS — B958 Unspecified staphylococcus as the cause of diseases classified elsewhere: Principal | ICD-10-CM

## 2020-03-02 DIAGNOSIS — G8929 Other chronic pain: Principal | ICD-10-CM

## 2020-03-02 DIAGNOSIS — G928 Other toxic encephalopathy: Principal | ICD-10-CM

## 2020-03-02 DIAGNOSIS — Z20822 Contact with and (suspected) exposure to covid-19: Principal | ICD-10-CM

## 2020-03-02 DIAGNOSIS — E872 Acidosis: Principal | ICD-10-CM

## 2020-03-02 DIAGNOSIS — B377 Candidal sepsis: Principal | ICD-10-CM

## 2020-03-02 DIAGNOSIS — F419 Anxiety disorder, unspecified: Principal | ICD-10-CM

## 2020-03-02 DIAGNOSIS — D6959 Other secondary thrombocytopenia: Principal | ICD-10-CM

## 2020-03-02 DIAGNOSIS — C9100 Acute lymphoblastic leukemia not having achieved remission: Principal | ICD-10-CM

## 2020-03-02 DIAGNOSIS — Z7409 Other reduced mobility: Principal | ICD-10-CM

## 2020-03-02 DIAGNOSIS — R531 Weakness: Principal | ICD-10-CM

## 2020-03-02 DIAGNOSIS — G4733 Obstructive sleep apnea (adult) (pediatric): Principal | ICD-10-CM

## 2020-03-02 DIAGNOSIS — E875 Hyperkalemia: Principal | ICD-10-CM

## 2020-03-02 DIAGNOSIS — I76 Septic arterial embolism: Principal | ICD-10-CM

## 2020-03-02 DIAGNOSIS — N170 Acute kidney failure with tubular necrosis: Principal | ICD-10-CM

## 2020-03-02 DIAGNOSIS — M545 Low back pain, unspecified: Principal | ICD-10-CM

## 2020-03-02 DIAGNOSIS — R6521 Severe sepsis with septic shock: Principal | ICD-10-CM

## 2020-03-02 DIAGNOSIS — J9601 Acute respiratory failure with hypoxia: Principal | ICD-10-CM

## 2020-03-02 DIAGNOSIS — R161 Splenomegaly, not elsewhere classified: Principal | ICD-10-CM

## 2020-03-02 DIAGNOSIS — E870 Hyperosmolality and hypernatremia: Principal | ICD-10-CM

## 2020-03-02 DIAGNOSIS — L89306 Pressure-induced deep tissue damage of unspecified buttock: Principal | ICD-10-CM

## 2020-03-02 DIAGNOSIS — G893 Neoplasm related pain (acute) (chronic): Principal | ICD-10-CM

## 2020-03-02 DIAGNOSIS — E440 Moderate protein-calorie malnutrition: Principal | ICD-10-CM

## 2020-03-02 DIAGNOSIS — L89153 Pressure ulcer of sacral region, stage 3: Principal | ICD-10-CM

## 2020-03-02 DIAGNOSIS — E883 Tumor lysis syndrome: Principal | ICD-10-CM

## 2020-03-02 DIAGNOSIS — J189 Pneumonia, unspecified organism: Principal | ICD-10-CM

## 2020-03-02 DIAGNOSIS — I10 Essential (primary) hypertension: Principal | ICD-10-CM

## 2020-03-02 DIAGNOSIS — D61810 Antineoplastic chemotherapy induced pancytopenia: Principal | ICD-10-CM

## 2020-03-02 DIAGNOSIS — K5900 Constipation, unspecified: Principal | ICD-10-CM

## 2020-03-02 DIAGNOSIS — I313 Pericardial effusion (noninflammatory): Principal | ICD-10-CM

## 2020-03-02 DIAGNOSIS — A4102 Sepsis due to Methicillin resistant Staphylococcus aureus: Principal | ICD-10-CM

## 2020-03-02 DIAGNOSIS — M609 Myositis, unspecified: Principal | ICD-10-CM

## 2020-03-02 DIAGNOSIS — R066 Hiccough: Principal | ICD-10-CM

## 2020-03-02 DIAGNOSIS — E871 Hypo-osmolality and hyponatremia: Principal | ICD-10-CM

## 2020-03-02 DIAGNOSIS — H81311 Aural vertigo, right ear: Principal | ICD-10-CM

## 2020-03-08 DIAGNOSIS — R161 Splenomegaly, not elsewhere classified: Principal | ICD-10-CM

## 2020-03-08 DIAGNOSIS — K5900 Constipation, unspecified: Principal | ICD-10-CM

## 2020-03-08 DIAGNOSIS — Z7409 Other reduced mobility: Principal | ICD-10-CM

## 2020-03-08 DIAGNOSIS — A4102 Sepsis due to Methicillin resistant Staphylococcus aureus: Principal | ICD-10-CM

## 2020-03-08 DIAGNOSIS — R066 Hiccough: Principal | ICD-10-CM

## 2020-03-08 DIAGNOSIS — R21 Rash and other nonspecific skin eruption: Principal | ICD-10-CM

## 2020-03-08 DIAGNOSIS — B3789 Other sites of candidiasis: Principal | ICD-10-CM

## 2020-03-08 DIAGNOSIS — E883 Tumor lysis syndrome: Principal | ICD-10-CM

## 2020-03-08 DIAGNOSIS — I10 Essential (primary) hypertension: Principal | ICD-10-CM

## 2020-03-08 DIAGNOSIS — H90A21 Sensorineural hearing loss, unilateral, right ear, with restricted hearing on the contralateral side: Principal | ICD-10-CM

## 2020-03-08 DIAGNOSIS — I313 Pericardial effusion (noninflammatory): Principal | ICD-10-CM

## 2020-03-08 DIAGNOSIS — L89153 Pressure ulcer of sacral region, stage 3: Principal | ICD-10-CM

## 2020-03-08 DIAGNOSIS — E871 Hypo-osmolality and hyponatremia: Principal | ICD-10-CM

## 2020-03-08 DIAGNOSIS — L89306 Pressure-induced deep tissue damage of unspecified buttock: Principal | ICD-10-CM

## 2020-03-08 DIAGNOSIS — D61810 Antineoplastic chemotherapy induced pancytopenia: Principal | ICD-10-CM

## 2020-03-08 DIAGNOSIS — R531 Weakness: Principal | ICD-10-CM

## 2020-03-08 DIAGNOSIS — E870 Hyperosmolality and hypernatremia: Principal | ICD-10-CM

## 2020-03-08 DIAGNOSIS — K1230 Oral mucositis (ulcerative), unspecified: Principal | ICD-10-CM

## 2020-03-08 DIAGNOSIS — F32A Depression, unspecified: Principal | ICD-10-CM

## 2020-03-08 DIAGNOSIS — J189 Pneumonia, unspecified organism: Principal | ICD-10-CM

## 2020-03-08 DIAGNOSIS — F419 Anxiety disorder, unspecified: Principal | ICD-10-CM

## 2020-03-08 DIAGNOSIS — B958 Unspecified staphylococcus as the cause of diseases classified elsewhere: Principal | ICD-10-CM

## 2020-03-08 DIAGNOSIS — E872 Acidosis: Principal | ICD-10-CM

## 2020-03-08 DIAGNOSIS — M545 Low back pain, unspecified: Principal | ICD-10-CM

## 2020-03-08 DIAGNOSIS — I76 Septic arterial embolism: Principal | ICD-10-CM

## 2020-03-08 DIAGNOSIS — E440 Moderate protein-calorie malnutrition: Principal | ICD-10-CM

## 2020-03-08 DIAGNOSIS — I330 Acute and subacute infective endocarditis: Principal | ICD-10-CM

## 2020-03-08 DIAGNOSIS — H81311 Aural vertigo, right ear: Principal | ICD-10-CM

## 2020-03-08 DIAGNOSIS — R6521 Severe sepsis with septic shock: Principal | ICD-10-CM

## 2020-03-08 DIAGNOSIS — D6959 Other secondary thrombocytopenia: Principal | ICD-10-CM

## 2020-03-08 DIAGNOSIS — J9601 Acute respiratory failure with hypoxia: Principal | ICD-10-CM

## 2020-03-08 DIAGNOSIS — E875 Hyperkalemia: Principal | ICD-10-CM

## 2020-03-08 DIAGNOSIS — C9100 Acute lymphoblastic leukemia not having achieved remission: Principal | ICD-10-CM

## 2020-03-08 DIAGNOSIS — G893 Neoplasm related pain (acute) (chronic): Principal | ICD-10-CM

## 2020-03-08 DIAGNOSIS — G928 Other toxic encephalopathy: Principal | ICD-10-CM

## 2020-03-08 DIAGNOSIS — B377 Candidal sepsis: Principal | ICD-10-CM

## 2020-03-08 DIAGNOSIS — M609 Myositis, unspecified: Principal | ICD-10-CM

## 2020-03-08 DIAGNOSIS — Z20822 Contact with and (suspected) exposure to covid-19: Principal | ICD-10-CM

## 2020-03-08 DIAGNOSIS — G4733 Obstructive sleep apnea (adult) (pediatric): Principal | ICD-10-CM

## 2020-03-08 DIAGNOSIS — N170 Acute kidney failure with tubular necrosis: Principal | ICD-10-CM

## 2020-03-08 DIAGNOSIS — G8929 Other chronic pain: Principal | ICD-10-CM

## 2020-03-10 DIAGNOSIS — L89306 Pressure-induced deep tissue damage of unspecified buttock: Principal | ICD-10-CM

## 2020-03-10 DIAGNOSIS — G928 Other toxic encephalopathy: Principal | ICD-10-CM

## 2020-03-10 DIAGNOSIS — R21 Rash and other nonspecific skin eruption: Principal | ICD-10-CM

## 2020-03-10 DIAGNOSIS — E872 Acidosis: Principal | ICD-10-CM

## 2020-03-10 DIAGNOSIS — K5900 Constipation, unspecified: Principal | ICD-10-CM

## 2020-03-10 DIAGNOSIS — D61810 Antineoplastic chemotherapy induced pancytopenia: Principal | ICD-10-CM

## 2020-03-10 DIAGNOSIS — B3789 Other sites of candidiasis: Principal | ICD-10-CM

## 2020-03-10 DIAGNOSIS — D6959 Other secondary thrombocytopenia: Principal | ICD-10-CM

## 2020-03-10 DIAGNOSIS — J9601 Acute respiratory failure with hypoxia: Principal | ICD-10-CM

## 2020-03-10 DIAGNOSIS — F419 Anxiety disorder, unspecified: Principal | ICD-10-CM

## 2020-03-10 DIAGNOSIS — G8929 Other chronic pain: Principal | ICD-10-CM

## 2020-03-10 DIAGNOSIS — M545 Low back pain, unspecified: Principal | ICD-10-CM

## 2020-03-10 DIAGNOSIS — Z7409 Other reduced mobility: Principal | ICD-10-CM

## 2020-03-10 DIAGNOSIS — M609 Myositis, unspecified: Principal | ICD-10-CM

## 2020-03-10 DIAGNOSIS — I313 Pericardial effusion (noninflammatory): Principal | ICD-10-CM

## 2020-03-10 DIAGNOSIS — I10 Essential (primary) hypertension: Principal | ICD-10-CM

## 2020-03-10 DIAGNOSIS — H90A21 Sensorineural hearing loss, unilateral, right ear, with restricted hearing on the contralateral side: Principal | ICD-10-CM

## 2020-03-10 DIAGNOSIS — H81311 Aural vertigo, right ear: Principal | ICD-10-CM

## 2020-03-10 DIAGNOSIS — R6521 Severe sepsis with septic shock: Principal | ICD-10-CM

## 2020-03-10 DIAGNOSIS — B958 Unspecified staphylococcus as the cause of diseases classified elsewhere: Principal | ICD-10-CM

## 2020-03-10 DIAGNOSIS — F32A Depression, unspecified: Principal | ICD-10-CM

## 2020-03-10 DIAGNOSIS — E875 Hyperkalemia: Principal | ICD-10-CM

## 2020-03-10 DIAGNOSIS — Z20822 Contact with and (suspected) exposure to covid-19: Principal | ICD-10-CM

## 2020-03-10 DIAGNOSIS — E883 Tumor lysis syndrome: Principal | ICD-10-CM

## 2020-03-10 DIAGNOSIS — E871 Hypo-osmolality and hyponatremia: Principal | ICD-10-CM

## 2020-03-10 DIAGNOSIS — E440 Moderate protein-calorie malnutrition: Principal | ICD-10-CM

## 2020-03-10 DIAGNOSIS — J189 Pneumonia, unspecified organism: Principal | ICD-10-CM

## 2020-03-10 DIAGNOSIS — C9100 Acute lymphoblastic leukemia not having achieved remission: Principal | ICD-10-CM

## 2020-03-10 DIAGNOSIS — L89153 Pressure ulcer of sacral region, stage 3: Principal | ICD-10-CM

## 2020-03-10 DIAGNOSIS — I330 Acute and subacute infective endocarditis: Principal | ICD-10-CM

## 2020-03-10 DIAGNOSIS — G4733 Obstructive sleep apnea (adult) (pediatric): Principal | ICD-10-CM

## 2020-03-10 DIAGNOSIS — A4102 Sepsis due to Methicillin resistant Staphylococcus aureus: Principal | ICD-10-CM

## 2020-03-10 DIAGNOSIS — N170 Acute kidney failure with tubular necrosis: Principal | ICD-10-CM

## 2020-03-10 DIAGNOSIS — R531 Weakness: Principal | ICD-10-CM

## 2020-03-10 DIAGNOSIS — R161 Splenomegaly, not elsewhere classified: Principal | ICD-10-CM

## 2020-03-10 DIAGNOSIS — I76 Septic arterial embolism: Principal | ICD-10-CM

## 2020-03-10 DIAGNOSIS — K1230 Oral mucositis (ulcerative), unspecified: Principal | ICD-10-CM

## 2020-03-10 DIAGNOSIS — G893 Neoplasm related pain (acute) (chronic): Principal | ICD-10-CM

## 2020-03-10 DIAGNOSIS — E870 Hyperosmolality and hypernatremia: Principal | ICD-10-CM

## 2020-03-10 DIAGNOSIS — B377 Candidal sepsis: Principal | ICD-10-CM

## 2020-03-10 DIAGNOSIS — R066 Hiccough: Principal | ICD-10-CM

## 2020-03-12 DIAGNOSIS — B3789 Other sites of candidiasis: Principal | ICD-10-CM

## 2020-03-12 DIAGNOSIS — H81311 Aural vertigo, right ear: Principal | ICD-10-CM

## 2020-03-12 DIAGNOSIS — K1230 Oral mucositis (ulcerative), unspecified: Principal | ICD-10-CM

## 2020-03-12 DIAGNOSIS — E883 Tumor lysis syndrome: Principal | ICD-10-CM

## 2020-03-12 DIAGNOSIS — I313 Pericardial effusion (noninflammatory): Principal | ICD-10-CM

## 2020-03-12 DIAGNOSIS — E870 Hyperosmolality and hypernatremia: Principal | ICD-10-CM

## 2020-03-12 DIAGNOSIS — G8929 Other chronic pain: Principal | ICD-10-CM

## 2020-03-12 DIAGNOSIS — I10 Essential (primary) hypertension: Principal | ICD-10-CM

## 2020-03-12 DIAGNOSIS — L89306 Pressure-induced deep tissue damage of unspecified buttock: Principal | ICD-10-CM

## 2020-03-12 DIAGNOSIS — G928 Other toxic encephalopathy: Principal | ICD-10-CM

## 2020-03-12 DIAGNOSIS — M609 Myositis, unspecified: Principal | ICD-10-CM

## 2020-03-12 DIAGNOSIS — R21 Rash and other nonspecific skin eruption: Principal | ICD-10-CM

## 2020-03-12 DIAGNOSIS — R161 Splenomegaly, not elsewhere classified: Principal | ICD-10-CM

## 2020-03-12 DIAGNOSIS — R531 Weakness: Principal | ICD-10-CM

## 2020-03-12 DIAGNOSIS — H90A21 Sensorineural hearing loss, unilateral, right ear, with restricted hearing on the contralateral side: Principal | ICD-10-CM

## 2020-03-12 DIAGNOSIS — A4102 Sepsis due to Methicillin resistant Staphylococcus aureus: Principal | ICD-10-CM

## 2020-03-12 DIAGNOSIS — N170 Acute kidney failure with tubular necrosis: Principal | ICD-10-CM

## 2020-03-12 DIAGNOSIS — F419 Anxiety disorder, unspecified: Principal | ICD-10-CM

## 2020-03-12 DIAGNOSIS — Z7409 Other reduced mobility: Principal | ICD-10-CM

## 2020-03-12 DIAGNOSIS — C9100 Acute lymphoblastic leukemia not having achieved remission: Principal | ICD-10-CM

## 2020-03-12 DIAGNOSIS — F32A Depression, unspecified: Principal | ICD-10-CM

## 2020-03-12 DIAGNOSIS — R066 Hiccough: Principal | ICD-10-CM

## 2020-03-12 DIAGNOSIS — E871 Hypo-osmolality and hyponatremia: Principal | ICD-10-CM

## 2020-03-12 DIAGNOSIS — B377 Candidal sepsis: Principal | ICD-10-CM

## 2020-03-12 DIAGNOSIS — B958 Unspecified staphylococcus as the cause of diseases classified elsewhere: Principal | ICD-10-CM

## 2020-03-12 DIAGNOSIS — K5900 Constipation, unspecified: Principal | ICD-10-CM

## 2020-03-12 DIAGNOSIS — E872 Acidosis: Principal | ICD-10-CM

## 2020-03-12 DIAGNOSIS — I76 Septic arterial embolism: Principal | ICD-10-CM

## 2020-03-12 DIAGNOSIS — D61810 Antineoplastic chemotherapy induced pancytopenia: Principal | ICD-10-CM

## 2020-03-12 DIAGNOSIS — L89153 Pressure ulcer of sacral region, stage 3: Principal | ICD-10-CM

## 2020-03-12 DIAGNOSIS — J9601 Acute respiratory failure with hypoxia: Principal | ICD-10-CM

## 2020-03-12 DIAGNOSIS — J189 Pneumonia, unspecified organism: Principal | ICD-10-CM

## 2020-03-12 DIAGNOSIS — Z20822 Contact with and (suspected) exposure to covid-19: Principal | ICD-10-CM

## 2020-03-12 DIAGNOSIS — R6521 Severe sepsis with septic shock: Principal | ICD-10-CM

## 2020-03-12 DIAGNOSIS — G893 Neoplasm related pain (acute) (chronic): Principal | ICD-10-CM

## 2020-03-12 DIAGNOSIS — E875 Hyperkalemia: Principal | ICD-10-CM

## 2020-03-12 DIAGNOSIS — G4733 Obstructive sleep apnea (adult) (pediatric): Principal | ICD-10-CM

## 2020-03-12 DIAGNOSIS — D6959 Other secondary thrombocytopenia: Principal | ICD-10-CM

## 2020-03-12 DIAGNOSIS — M545 Low back pain, unspecified: Principal | ICD-10-CM

## 2020-03-12 DIAGNOSIS — E440 Moderate protein-calorie malnutrition: Principal | ICD-10-CM

## 2020-03-12 DIAGNOSIS — I330 Acute and subacute infective endocarditis: Principal | ICD-10-CM

## 2020-03-14 DIAGNOSIS — G8929 Other chronic pain: Principal | ICD-10-CM

## 2020-03-14 DIAGNOSIS — B377 Candidal sepsis: Principal | ICD-10-CM

## 2020-03-14 DIAGNOSIS — Z20822 Contact with and (suspected) exposure to covid-19: Principal | ICD-10-CM

## 2020-03-14 DIAGNOSIS — K1230 Oral mucositis (ulcerative), unspecified: Principal | ICD-10-CM

## 2020-03-14 DIAGNOSIS — L89153 Pressure ulcer of sacral region, stage 3: Principal | ICD-10-CM

## 2020-03-14 DIAGNOSIS — R066 Hiccough: Principal | ICD-10-CM

## 2020-03-14 DIAGNOSIS — G893 Neoplasm related pain (acute) (chronic): Principal | ICD-10-CM

## 2020-03-14 DIAGNOSIS — K5900 Constipation, unspecified: Principal | ICD-10-CM

## 2020-03-14 DIAGNOSIS — M609 Myositis, unspecified: Principal | ICD-10-CM

## 2020-03-14 DIAGNOSIS — R531 Weakness: Principal | ICD-10-CM

## 2020-03-14 DIAGNOSIS — N170 Acute kidney failure with tubular necrosis: Principal | ICD-10-CM

## 2020-03-14 DIAGNOSIS — E440 Moderate protein-calorie malnutrition: Principal | ICD-10-CM

## 2020-03-14 DIAGNOSIS — I330 Acute and subacute infective endocarditis: Principal | ICD-10-CM

## 2020-03-14 DIAGNOSIS — R6521 Severe sepsis with septic shock: Principal | ICD-10-CM

## 2020-03-14 DIAGNOSIS — R161 Splenomegaly, not elsewhere classified: Principal | ICD-10-CM

## 2020-03-14 DIAGNOSIS — H90A21 Sensorineural hearing loss, unilateral, right ear, with restricted hearing on the contralateral side: Principal | ICD-10-CM

## 2020-03-14 DIAGNOSIS — B958 Unspecified staphylococcus as the cause of diseases classified elsewhere: Principal | ICD-10-CM

## 2020-03-14 DIAGNOSIS — J9601 Acute respiratory failure with hypoxia: Principal | ICD-10-CM

## 2020-03-14 DIAGNOSIS — E883 Tumor lysis syndrome: Principal | ICD-10-CM

## 2020-03-14 DIAGNOSIS — M545 Low back pain, unspecified: Principal | ICD-10-CM

## 2020-03-14 DIAGNOSIS — Z7409 Other reduced mobility: Principal | ICD-10-CM

## 2020-03-14 DIAGNOSIS — F419 Anxiety disorder, unspecified: Principal | ICD-10-CM

## 2020-03-14 DIAGNOSIS — J189 Pneumonia, unspecified organism: Principal | ICD-10-CM

## 2020-03-14 DIAGNOSIS — F32A Depression, unspecified: Principal | ICD-10-CM

## 2020-03-14 DIAGNOSIS — D6959 Other secondary thrombocytopenia: Principal | ICD-10-CM

## 2020-03-14 DIAGNOSIS — A4102 Sepsis due to Methicillin resistant Staphylococcus aureus: Principal | ICD-10-CM

## 2020-03-14 DIAGNOSIS — I10 Essential (primary) hypertension: Principal | ICD-10-CM

## 2020-03-14 DIAGNOSIS — E871 Hypo-osmolality and hyponatremia: Principal | ICD-10-CM

## 2020-03-14 DIAGNOSIS — C9100 Acute lymphoblastic leukemia not having achieved remission: Principal | ICD-10-CM

## 2020-03-14 DIAGNOSIS — E872 Acidosis: Principal | ICD-10-CM

## 2020-03-14 DIAGNOSIS — D61810 Antineoplastic chemotherapy induced pancytopenia: Principal | ICD-10-CM

## 2020-03-14 DIAGNOSIS — I76 Septic arterial embolism: Principal | ICD-10-CM

## 2020-03-14 DIAGNOSIS — H81311 Aural vertigo, right ear: Principal | ICD-10-CM

## 2020-03-14 DIAGNOSIS — E875 Hyperkalemia: Principal | ICD-10-CM

## 2020-03-14 DIAGNOSIS — G4733 Obstructive sleep apnea (adult) (pediatric): Principal | ICD-10-CM

## 2020-03-14 DIAGNOSIS — R21 Rash and other nonspecific skin eruption: Principal | ICD-10-CM

## 2020-03-14 DIAGNOSIS — L89306 Pressure-induced deep tissue damage of unspecified buttock: Principal | ICD-10-CM

## 2020-03-14 DIAGNOSIS — B3789 Other sites of candidiasis: Principal | ICD-10-CM

## 2020-03-14 DIAGNOSIS — G928 Other toxic encephalopathy: Principal | ICD-10-CM

## 2020-03-14 DIAGNOSIS — E870 Hyperosmolality and hypernatremia: Principal | ICD-10-CM

## 2020-03-14 DIAGNOSIS — I313 Pericardial effusion (noninflammatory): Principal | ICD-10-CM

## 2020-03-22 DIAGNOSIS — E871 Hypo-osmolality and hyponatremia: Principal | ICD-10-CM

## 2020-03-22 DIAGNOSIS — R531 Weakness: Principal | ICD-10-CM

## 2020-03-22 DIAGNOSIS — Z20822 Contact with and (suspected) exposure to covid-19: Principal | ICD-10-CM

## 2020-03-22 DIAGNOSIS — M545 Low back pain, unspecified: Principal | ICD-10-CM

## 2020-03-22 DIAGNOSIS — G4733 Obstructive sleep apnea (adult) (pediatric): Principal | ICD-10-CM

## 2020-03-22 DIAGNOSIS — J9601 Acute respiratory failure with hypoxia: Principal | ICD-10-CM

## 2020-03-22 DIAGNOSIS — G8929 Other chronic pain: Principal | ICD-10-CM

## 2020-03-22 DIAGNOSIS — R21 Rash and other nonspecific skin eruption: Principal | ICD-10-CM

## 2020-03-22 DIAGNOSIS — N170 Acute kidney failure with tubular necrosis: Principal | ICD-10-CM

## 2020-03-22 DIAGNOSIS — I10 Essential (primary) hypertension: Principal | ICD-10-CM

## 2020-03-22 DIAGNOSIS — K1230 Oral mucositis (ulcerative), unspecified: Principal | ICD-10-CM

## 2020-03-22 DIAGNOSIS — D61810 Antineoplastic chemotherapy induced pancytopenia: Principal | ICD-10-CM

## 2020-03-22 DIAGNOSIS — C9100 Acute lymphoblastic leukemia not having achieved remission: Principal | ICD-10-CM

## 2020-03-22 DIAGNOSIS — H81311 Aural vertigo, right ear: Principal | ICD-10-CM

## 2020-03-22 DIAGNOSIS — E870 Hyperosmolality and hypernatremia: Principal | ICD-10-CM

## 2020-03-22 DIAGNOSIS — Z7409 Other reduced mobility: Principal | ICD-10-CM

## 2020-03-22 DIAGNOSIS — G928 Other toxic encephalopathy: Principal | ICD-10-CM

## 2020-03-22 DIAGNOSIS — F419 Anxiety disorder, unspecified: Principal | ICD-10-CM

## 2020-03-22 DIAGNOSIS — I330 Acute and subacute infective endocarditis: Principal | ICD-10-CM

## 2020-03-22 DIAGNOSIS — B958 Unspecified staphylococcus as the cause of diseases classified elsewhere: Principal | ICD-10-CM

## 2020-03-22 DIAGNOSIS — R161 Splenomegaly, not elsewhere classified: Principal | ICD-10-CM

## 2020-03-22 DIAGNOSIS — F32A Depression, unspecified: Principal | ICD-10-CM

## 2020-03-22 DIAGNOSIS — I76 Septic arterial embolism: Principal | ICD-10-CM

## 2020-03-22 DIAGNOSIS — L89153 Pressure ulcer of sacral region, stage 3: Principal | ICD-10-CM

## 2020-03-22 DIAGNOSIS — E883 Tumor lysis syndrome: Principal | ICD-10-CM

## 2020-03-22 DIAGNOSIS — B3789 Other sites of candidiasis: Principal | ICD-10-CM

## 2020-03-22 DIAGNOSIS — A4102 Sepsis due to Methicillin resistant Staphylococcus aureus: Principal | ICD-10-CM

## 2020-03-22 DIAGNOSIS — M609 Myositis, unspecified: Principal | ICD-10-CM

## 2020-03-22 DIAGNOSIS — R066 Hiccough: Principal | ICD-10-CM

## 2020-03-22 DIAGNOSIS — B377 Candidal sepsis: Principal | ICD-10-CM

## 2020-03-22 DIAGNOSIS — H90A21 Sensorineural hearing loss, unilateral, right ear, with restricted hearing on the contralateral side: Principal | ICD-10-CM

## 2020-03-22 DIAGNOSIS — E440 Moderate protein-calorie malnutrition: Principal | ICD-10-CM

## 2020-03-22 DIAGNOSIS — L89306 Pressure-induced deep tissue damage of unspecified buttock: Principal | ICD-10-CM

## 2020-03-22 DIAGNOSIS — D6959 Other secondary thrombocytopenia: Principal | ICD-10-CM

## 2020-03-22 DIAGNOSIS — G893 Neoplasm related pain (acute) (chronic): Principal | ICD-10-CM

## 2020-03-22 DIAGNOSIS — K5900 Constipation, unspecified: Principal | ICD-10-CM

## 2020-03-22 DIAGNOSIS — E872 Acidosis: Principal | ICD-10-CM

## 2020-03-22 DIAGNOSIS — I313 Pericardial effusion (noninflammatory): Principal | ICD-10-CM

## 2020-03-22 DIAGNOSIS — E875 Hyperkalemia: Principal | ICD-10-CM

## 2020-03-22 DIAGNOSIS — J189 Pneumonia, unspecified organism: Principal | ICD-10-CM

## 2020-03-22 DIAGNOSIS — R6521 Severe sepsis with septic shock: Principal | ICD-10-CM

## 2020-03-23 DIAGNOSIS — J9601 Acute respiratory failure with hypoxia: Principal | ICD-10-CM

## 2020-03-23 DIAGNOSIS — G4733 Obstructive sleep apnea (adult) (pediatric): Principal | ICD-10-CM

## 2020-03-23 DIAGNOSIS — N170 Acute kidney failure with tubular necrosis: Principal | ICD-10-CM

## 2020-03-23 DIAGNOSIS — Z20822 Contact with and (suspected) exposure to covid-19: Principal | ICD-10-CM

## 2020-03-23 DIAGNOSIS — K5900 Constipation, unspecified: Principal | ICD-10-CM

## 2020-03-23 DIAGNOSIS — M609 Myositis, unspecified: Principal | ICD-10-CM

## 2020-03-23 DIAGNOSIS — E440 Moderate protein-calorie malnutrition: Principal | ICD-10-CM

## 2020-03-23 DIAGNOSIS — B377 Candidal sepsis: Principal | ICD-10-CM

## 2020-03-23 DIAGNOSIS — E883 Tumor lysis syndrome: Principal | ICD-10-CM

## 2020-03-23 DIAGNOSIS — R161 Splenomegaly, not elsewhere classified: Principal | ICD-10-CM

## 2020-03-23 DIAGNOSIS — G893 Neoplasm related pain (acute) (chronic): Principal | ICD-10-CM

## 2020-03-23 DIAGNOSIS — R6521 Severe sepsis with septic shock: Principal | ICD-10-CM

## 2020-03-23 DIAGNOSIS — B958 Unspecified staphylococcus as the cause of diseases classified elsewhere: Principal | ICD-10-CM

## 2020-03-23 DIAGNOSIS — R21 Rash and other nonspecific skin eruption: Principal | ICD-10-CM

## 2020-03-23 DIAGNOSIS — B3789 Other sites of candidiasis: Principal | ICD-10-CM

## 2020-03-23 DIAGNOSIS — G8929 Other chronic pain: Principal | ICD-10-CM

## 2020-03-23 DIAGNOSIS — I330 Acute and subacute infective endocarditis: Principal | ICD-10-CM

## 2020-03-23 DIAGNOSIS — G928 Other toxic encephalopathy: Principal | ICD-10-CM

## 2020-03-23 DIAGNOSIS — I76 Septic arterial embolism: Principal | ICD-10-CM

## 2020-03-23 DIAGNOSIS — E870 Hyperosmolality and hypernatremia: Principal | ICD-10-CM

## 2020-03-23 DIAGNOSIS — F32A Depression, unspecified: Principal | ICD-10-CM

## 2020-03-23 DIAGNOSIS — D61810 Antineoplastic chemotherapy induced pancytopenia: Principal | ICD-10-CM

## 2020-03-23 DIAGNOSIS — R066 Hiccough: Principal | ICD-10-CM

## 2020-03-23 DIAGNOSIS — M545 Low back pain, unspecified: Principal | ICD-10-CM

## 2020-03-23 DIAGNOSIS — E871 Hypo-osmolality and hyponatremia: Principal | ICD-10-CM

## 2020-03-23 DIAGNOSIS — H90A21 Sensorineural hearing loss, unilateral, right ear, with restricted hearing on the contralateral side: Principal | ICD-10-CM

## 2020-03-23 DIAGNOSIS — I313 Pericardial effusion (noninflammatory): Principal | ICD-10-CM

## 2020-03-23 DIAGNOSIS — I10 Essential (primary) hypertension: Principal | ICD-10-CM

## 2020-03-23 DIAGNOSIS — K1230 Oral mucositis (ulcerative), unspecified: Principal | ICD-10-CM

## 2020-03-23 DIAGNOSIS — A4102 Sepsis due to Methicillin resistant Staphylococcus aureus: Principal | ICD-10-CM

## 2020-03-23 DIAGNOSIS — E872 Acidosis: Principal | ICD-10-CM

## 2020-03-23 DIAGNOSIS — Z7409 Other reduced mobility: Principal | ICD-10-CM

## 2020-03-23 DIAGNOSIS — L89306 Pressure-induced deep tissue damage of unspecified buttock: Principal | ICD-10-CM

## 2020-03-23 DIAGNOSIS — E875 Hyperkalemia: Principal | ICD-10-CM

## 2020-03-23 DIAGNOSIS — D6959 Other secondary thrombocytopenia: Principal | ICD-10-CM

## 2020-03-23 DIAGNOSIS — F419 Anxiety disorder, unspecified: Principal | ICD-10-CM

## 2020-03-23 DIAGNOSIS — H81311 Aural vertigo, right ear: Principal | ICD-10-CM

## 2020-03-23 DIAGNOSIS — C9100 Acute lymphoblastic leukemia not having achieved remission: Principal | ICD-10-CM

## 2020-03-23 DIAGNOSIS — L89153 Pressure ulcer of sacral region, stage 3: Principal | ICD-10-CM

## 2020-03-23 DIAGNOSIS — J189 Pneumonia, unspecified organism: Principal | ICD-10-CM

## 2020-03-23 DIAGNOSIS — R531 Weakness: Principal | ICD-10-CM

## 2020-04-01 ENCOUNTER — Ambulatory Visit
Admission: TF | Admit: 2020-04-01 | Discharge: 2020-04-08 | Disposition: A | Discharge status: Home / Self Care | Payer: MEDICAID | Source: Intra-hospital

## 2020-04-01 DIAGNOSIS — C9100 Acute lymphoblastic leukemia not having achieved remission: Principal | ICD-10-CM

## 2020-04-01 DIAGNOSIS — H903 Sensorineural hearing loss, bilateral: Principal | ICD-10-CM

## 2020-04-01 DIAGNOSIS — R7401 Elevation of levels of liver transaminase levels: Principal | ICD-10-CM

## 2020-04-01 DIAGNOSIS — R42 Dizziness and giddiness: Principal | ICD-10-CM

## 2020-04-01 DIAGNOSIS — D849 Immunodeficiency, unspecified: Principal | ICD-10-CM

## 2020-04-01 DIAGNOSIS — H905 Unspecified sensorineural hearing loss: Principal | ICD-10-CM

## 2020-04-01 DIAGNOSIS — M545 Low back pain, unspecified: Principal | ICD-10-CM

## 2020-04-01 DIAGNOSIS — R110 Nausea: Principal | ICD-10-CM

## 2020-04-01 DIAGNOSIS — H9190 Unspecified hearing loss, unspecified ear: Principal | ICD-10-CM

## 2020-04-01 DIAGNOSIS — G8929 Other chronic pain: Principal | ICD-10-CM

## 2020-04-01 DIAGNOSIS — T451X5A Adverse effect of antineoplastic and immunosuppressive drugs, initial encounter: Principal | ICD-10-CM

## 2020-04-01 DIAGNOSIS — R5381 Other malaise: Principal | ICD-10-CM

## 2020-04-01 DIAGNOSIS — L89153 Pressure ulcer of sacral region, stage 3: Principal | ICD-10-CM

## 2020-04-01 DIAGNOSIS — K5900 Constipation, unspecified: Principal | ICD-10-CM

## 2020-04-01 DIAGNOSIS — F32A Depression, unspecified: Principal | ICD-10-CM

## 2020-04-01 DIAGNOSIS — C9101 Acute lymphoblastic leukemia, in remission: Principal | ICD-10-CM

## 2020-04-01 DIAGNOSIS — F17210 Nicotine dependence, cigarettes, uncomplicated: Principal | ICD-10-CM

## 2020-04-01 DIAGNOSIS — B377 Candidal sepsis: Principal | ICD-10-CM

## 2020-04-01 DIAGNOSIS — F419 Anxiety disorder, unspecified: Principal | ICD-10-CM

## 2020-04-01 DIAGNOSIS — D61810 Antineoplastic chemotherapy induced pancytopenia: Principal | ICD-10-CM

## 2020-04-01 DIAGNOSIS — I10 Essential (primary) hypertension: Principal | ICD-10-CM

## 2020-04-01 DIAGNOSIS — C9500 Acute leukemia of unspecified cell type not having achieved remission: Principal | ICD-10-CM

## 2020-04-07 MED ORDER — MICAFUNGIN IVPB (50 MG VIAL) IN 100 ML
INTRAVENOUS | 0 refills | 0.00000 days | Status: CP
Start: 2020-04-07 — End: 2020-05-13

## 2020-04-08 DIAGNOSIS — G8929 Other chronic pain: Principal | ICD-10-CM

## 2020-04-08 DIAGNOSIS — R5381 Other malaise: Principal | ICD-10-CM

## 2020-04-08 DIAGNOSIS — F32A Depression, unspecified: Principal | ICD-10-CM

## 2020-04-08 DIAGNOSIS — R7401 Elevation of levels of liver transaminase levels: Principal | ICD-10-CM

## 2020-04-08 DIAGNOSIS — D61810 Antineoplastic chemotherapy induced pancytopenia: Principal | ICD-10-CM

## 2020-04-08 DIAGNOSIS — C9100 Acute lymphoblastic leukemia not having achieved remission: Principal | ICD-10-CM

## 2020-04-08 DIAGNOSIS — I10 Essential (primary) hypertension: Principal | ICD-10-CM

## 2020-04-08 DIAGNOSIS — M545 Low back pain, unspecified: Principal | ICD-10-CM

## 2020-04-08 DIAGNOSIS — H903 Sensorineural hearing loss, bilateral: Principal | ICD-10-CM

## 2020-04-08 DIAGNOSIS — L89153 Pressure ulcer of sacral region, stage 3: Principal | ICD-10-CM

## 2020-04-08 DIAGNOSIS — C9101 Acute lymphoblastic leukemia, in remission: Principal | ICD-10-CM

## 2020-04-08 DIAGNOSIS — F17210 Nicotine dependence, cigarettes, uncomplicated: Principal | ICD-10-CM

## 2020-04-08 DIAGNOSIS — K5900 Constipation, unspecified: Principal | ICD-10-CM

## 2020-04-08 DIAGNOSIS — R42 Dizziness and giddiness: Principal | ICD-10-CM

## 2020-04-08 DIAGNOSIS — F419 Anxiety disorder, unspecified: Principal | ICD-10-CM

## 2020-04-08 DIAGNOSIS — D849 Immunodeficiency, unspecified: Principal | ICD-10-CM

## 2020-04-08 DIAGNOSIS — T451X5A Adverse effect of antineoplastic and immunosuppressive drugs, initial encounter: Principal | ICD-10-CM

## 2020-04-08 DIAGNOSIS — B377 Candidal sepsis: Principal | ICD-10-CM

## 2020-04-08 MED ORDER — CARVEDILOL 6.25 MG TABLET
ORAL_TABLET | Freq: Two times a day (BID) | ORAL | 0 refills | 30.00000 days | Status: CP
Start: 2020-04-08 — End: 2020-04-15
  Filled 2020-04-08: qty 60, 30d supply, fill #0

## 2020-04-08 MED ORDER — LORAZEPAM 0.5 MG TABLET
ORAL_TABLET | Freq: Every day | ORAL | 0 refills | 5.00000 days | Status: CP
Start: 2020-04-08 — End: 2020-04-13
  Filled 2020-04-08: qty 5, 5d supply, fill #0

## 2020-04-08 MED ORDER — HYDROMORPHONE 2 MG TABLET
ORAL_TABLET | 0 refills | 0 days | Status: CP
Start: 2020-04-08 — End: 2020-04-16
  Filled 2020-04-08: qty 20, 4d supply, fill #0

## 2020-04-08 MED ORDER — DULOXETINE 30 MG CAPSULE,DELAYED RELEASE
ORAL_CAPSULE | Freq: Two times a day (BID) | ORAL | 0 refills | 30 days | Status: CP
Start: 2020-04-08 — End: 2020-04-15
  Filled 2020-04-08: qty 60, 30d supply, fill #0

## 2020-04-08 MED ORDER — SODIUM CHLORIDE 0.65 % NASAL SPRAY AEROSOL
Freq: Three times a day (TID) | NASAL | 0 refills | 74 days | Status: CP
Start: 2020-04-08 — End: ?
  Filled 2020-04-08: qty 44, 74d supply, fill #0

## 2020-04-08 MED ORDER — ONDANSETRON HCL 8 MG TABLET
ORAL_TABLET | Freq: Three times a day (TID) | ORAL | 0 refills | 5.00000 days | Status: CP | PRN
Start: 2020-04-08 — End: 2020-04-15
  Filled 2020-04-08: qty 15, 5d supply, fill #0

## 2020-04-08 MED ORDER — FLUTICASONE PROPIONATE 50 MCG/ACTUATION NASAL SPRAY,SUSPENSION
Freq: Two times a day (BID) | NASAL | 0 refills | 0 days | Status: CP
Start: 2020-04-08 — End: ?
  Filled 2020-04-08: qty 16, 30d supply, fill #0

## 2020-04-08 MED ORDER — MIRTAZAPINE 15 MG TABLET
ORAL_TABLET | Freq: Every evening | ORAL | 3 refills | 30 days | Status: CP
Start: 2020-04-08 — End: ?
  Filled 2020-04-08: qty 30, 30d supply, fill #0

## 2020-04-08 MED ORDER — AMLODIPINE 10 MG TABLET
ORAL_TABLET | Freq: Every day | ORAL | 0 refills | 30 days | Status: CP
Start: 2020-04-08 — End: 2020-04-15
  Filled 2020-04-08: qty 30, 30d supply, fill #0

## 2020-04-08 MED ORDER — MECLIZINE 25 MG TABLET
ORAL_TABLET | Freq: Three times a day (TID) | ORAL | 0 refills | 30.00000 days | Status: CP
Start: 2020-04-08 — End: 2020-05-08
  Filled 2020-04-08: qty 90, 30d supply, fill #0

## 2020-04-08 MED ORDER — FAMOTIDINE 20 MG TABLET
ORAL_TABLET | Freq: Every day | ORAL | 0 refills | 30 days | Status: CP
Start: 2020-04-08 — End: 2020-04-15
  Filled 2020-04-08: qty 30, 30d supply, fill #0

## 2020-04-08 MED ORDER — VALACYCLOVIR 500 MG TABLET
ORAL_TABLET | Freq: Every day | ORAL | 0 refills | 30 days | Status: CP
Start: 2020-04-08 — End: 2020-04-15
  Filled 2020-04-08: qty 30, 30d supply, fill #0

## 2020-04-08 MED FILL — MIRTAZAPINE 15 MG TABLET: 30 days supply | Qty: 30 | Fill #0 | Status: AC

## 2020-04-08 MED FILL — ONDANSETRON HCL 8 MG TABLET: 5 days supply | Qty: 15 | Fill #0 | Status: AC

## 2020-04-08 MED FILL — SULFAMETHOXAZOLE 800 MG-TRIMETHOPRIM 160 MG TABLET: ORAL | 28 days supply | Qty: 16 | Fill #0

## 2020-04-08 MED FILL — FLUTICASONE PROPIONATE 50 MCG/ACTUATION NASAL SPRAY,SUSPENSION: 30 days supply | Qty: 16 | Fill #0 | Status: AC

## 2020-04-08 MED FILL — DULOXETINE 30 MG CAPSULE,DELAYED RELEASE: 30 days supply | Qty: 60 | Fill #0 | Status: AC

## 2020-04-08 MED FILL — FAMOTIDINE 20 MG TABLET: 30 days supply | Qty: 30 | Fill #0 | Status: AC

## 2020-04-08 MED FILL — LORAZEPAM 0.5 MG TABLET: 5 days supply | Qty: 5 | Fill #0 | Status: AC

## 2020-04-08 MED FILL — CARVEDILOL 6.25 MG TABLET: 30 days supply | Qty: 60 | Fill #0 | Status: AC

## 2020-04-08 MED FILL — AMLODIPINE 10 MG TABLET: 30 days supply | Qty: 30 | Fill #0 | Status: AC

## 2020-04-08 MED FILL — SULFAMETHOXAZOLE 800 MG-TRIMETHOPRIM 160 MG TABLET: 28 days supply | Qty: 16 | Fill #0 | Status: AC

## 2020-04-08 MED FILL — VALACYCLOVIR 500 MG TABLET: 30 days supply | Qty: 30 | Fill #0 | Status: AC

## 2020-04-08 MED FILL — HYDROMORPHONE 2 MG TABLET: 4 days supply | Qty: 20 | Fill #0 | Status: AC

## 2020-04-08 MED FILL — MECLIZINE 25 MG TABLET: 30 days supply | Qty: 90 | Fill #0 | Status: AC

## 2020-04-08 MED FILL — DEEP SEA NASAL 0.65 % SPRAY AEROSOL: 74 days supply | Qty: 44 | Fill #0 | Status: AC

## 2020-04-11 MED ORDER — SULFAMETHOXAZOLE 800 MG-TRIMETHOPRIM 160 MG TABLET
ORAL_TABLET | Freq: Two times a day (BID) | ORAL | 0 refills | 28 days | Status: CP
Start: 2020-04-11 — End: 2020-04-15

## 2020-04-13 ENCOUNTER — Ambulatory Visit: Admit: 2020-04-13 | Discharge: 2020-05-12 | Payer: MEDICAID

## 2020-04-13 ENCOUNTER — Ambulatory Visit
Admit: 2020-04-13 | Discharge: 2020-05-12 | Payer: MEDICAID | Attending: Rehabilitative and Restorative Service Providers" | Primary: Rehabilitative and Restorative Service Providers"

## 2020-04-13 ENCOUNTER — Encounter
Admit: 2020-04-13 | Discharge: 2020-05-12 | Payer: MEDICAID | Attending: Student in an Organized Health Care Education/Training Program | Primary: Student in an Organized Health Care Education/Training Program

## 2020-04-13 ENCOUNTER — Encounter: Admit: 2020-04-13 | Discharge: 2020-05-12 | Payer: MEDICAID

## 2020-04-13 ENCOUNTER — Ambulatory Visit: Admit: 2020-04-13 | Discharge: 2020-04-13 | Payer: MEDICAID | Attending: Adult Health | Primary: Adult Health

## 2020-04-13 ENCOUNTER — Ambulatory Visit
Admit: 2020-04-13 | Discharge: 2020-05-12 | Payer: MEDICAID | Attending: Student in an Organized Health Care Education/Training Program | Primary: Student in an Organized Health Care Education/Training Program

## 2020-04-13 ENCOUNTER — Other Ambulatory Visit: Admit: 2020-04-13 | Discharge: 2020-04-13 | Payer: MEDICAID

## 2020-04-13 DIAGNOSIS — R42 Dizziness and giddiness: Principal | ICD-10-CM

## 2020-04-13 DIAGNOSIS — M79606 Pain in leg, unspecified: Principal | ICD-10-CM

## 2020-04-13 DIAGNOSIS — H905 Unspecified sensorineural hearing loss: Principal | ICD-10-CM

## 2020-04-13 DIAGNOSIS — Z79899 Other long term (current) drug therapy: Principal | ICD-10-CM

## 2020-04-13 DIAGNOSIS — C9100 Acute lymphoblastic leukemia not having achieved remission: Principal | ICD-10-CM

## 2020-04-13 DIAGNOSIS — Z992 Dependence on renal dialysis: Principal | ICD-10-CM

## 2020-04-13 DIAGNOSIS — G8929 Other chronic pain: Principal | ICD-10-CM

## 2020-04-13 DIAGNOSIS — Z7409 Other reduced mobility: Principal | ICD-10-CM

## 2020-04-13 DIAGNOSIS — R2689 Other abnormalities of gait and mobility: Principal | ICD-10-CM

## 2020-04-13 DIAGNOSIS — R5381 Other malaise: Principal | ICD-10-CM

## 2020-04-13 DIAGNOSIS — N179 Acute kidney failure, unspecified: Principal | ICD-10-CM

## 2020-04-13 DIAGNOSIS — M549 Dorsalgia, unspecified: Principal | ICD-10-CM

## 2020-04-13 DIAGNOSIS — I10 Essential (primary) hypertension: Principal | ICD-10-CM

## 2020-04-13 DIAGNOSIS — R3915 Urgency of urination: Principal | ICD-10-CM

## 2020-04-13 DIAGNOSIS — M5440 Lumbago with sciatica, unspecified side: Principal | ICD-10-CM

## 2020-04-13 DIAGNOSIS — M25559 Pain in unspecified hip: Principal | ICD-10-CM

## 2020-04-14 DIAGNOSIS — C9100 Acute lymphoblastic leukemia not having achieved remission: Principal | ICD-10-CM

## 2020-04-15 ENCOUNTER — Ambulatory Visit: Admit: 2020-04-15 | Discharge: 2020-04-15 | Payer: MEDICAID | Attending: Pharmacist | Primary: Pharmacist

## 2020-04-15 ENCOUNTER — Other Ambulatory Visit: Admit: 2020-04-15 | Discharge: 2020-04-15 | Payer: MEDICAID

## 2020-04-15 ENCOUNTER — Ambulatory Visit: Admit: 2020-04-15 | Discharge: 2020-04-15 | Payer: MEDICAID | Attending: Internal Medicine | Primary: Internal Medicine

## 2020-04-15 DIAGNOSIS — Z79899 Other long term (current) drug therapy: Principal | ICD-10-CM

## 2020-04-15 DIAGNOSIS — F17210 Nicotine dependence, cigarettes, uncomplicated: Principal | ICD-10-CM

## 2020-04-15 DIAGNOSIS — C9100 Acute lymphoblastic leukemia not having achieved remission: Principal | ICD-10-CM

## 2020-04-15 DIAGNOSIS — R110 Nausea: Principal | ICD-10-CM

## 2020-04-15 DIAGNOSIS — C9500 Acute leukemia of unspecified cell type not having achieved remission: Principal | ICD-10-CM

## 2020-04-15 MED ORDER — ONDANSETRON HCL 8 MG TABLET
ORAL_TABLET | Freq: Three times a day (TID) | ORAL | 2 refills | 20 days | Status: CP | PRN
Start: 2020-04-15 — End: ?
  Filled 2020-04-15: qty 60, 20d supply, fill #0

## 2020-04-15 MED ORDER — AMLODIPINE 10 MG TABLET
ORAL_TABLET | Freq: Every day | ORAL | 0 refills | 30 days | Status: CP
Start: 2020-04-15 — End: ?

## 2020-04-15 MED ORDER — VALACYCLOVIR 500 MG TABLET
ORAL_TABLET | Freq: Every day | ORAL | 3 refills | 90 days | Status: CP
Start: 2020-04-15 — End: ?
  Filled 2020-05-13: qty 30, 30d supply, fill #0

## 2020-04-15 MED ORDER — CARVEDILOL 6.25 MG TABLET
ORAL_TABLET | Freq: Two times a day (BID) | ORAL | 0 refills | 30.00000 days | Status: CP
Start: 2020-04-15 — End: 2020-04-24

## 2020-04-15 MED ORDER — DULOXETINE 30 MG CAPSULE,DELAYED RELEASE
ORAL_CAPSULE | Freq: Two times a day (BID) | ORAL | 0 refills | 30 days | Status: CP
Start: 2020-04-15 — End: ?
  Filled 2020-05-22: qty 60, 30d supply, fill #0

## 2020-04-15 MED ORDER — MECLIZINE 25 MG TABLET
ORAL_TABLET | Freq: Three times a day (TID) | ORAL | 2 refills | 30.00000 days | Status: CP
Start: 2020-04-15 — End: 2020-06-05
  Filled 2020-05-06: qty 90, 30d supply, fill #0

## 2020-04-15 MED ORDER — LORAZEPAM 0.5 MG TABLET
ORAL_TABLET | Freq: Every day | ORAL | 0 refills | 30.00000 days | Status: CP
Start: 2020-04-15 — End: ?
  Filled 2020-04-15: qty 30, 30d supply, fill #0

## 2020-04-15 MED ORDER — FAMOTIDINE 20 MG TABLET
ORAL_TABLET | Freq: Every day | ORAL | 11 refills | 30 days | Status: CP
Start: 2020-04-15 — End: ?
  Filled 2020-05-06: qty 30, 30d supply, fill #0

## 2020-04-16 ENCOUNTER — Ambulatory Visit: Admit: 2020-04-16 | Discharge: 2020-04-17 | Payer: MEDICAID

## 2020-04-16 ENCOUNTER — Other Ambulatory Visit: Admit: 2020-04-16 | Discharge: 2020-04-17 | Payer: MEDICAID

## 2020-04-16 ENCOUNTER — Ambulatory Visit: Admit: 2020-04-16 | Discharge: 2020-04-17 | Payer: MEDICAID | Attending: Adult Health | Primary: Adult Health

## 2020-04-16 ENCOUNTER — Ambulatory Visit
Admit: 2020-04-16 | Discharge: 2020-04-17 | Payer: MEDICAID | Attending: Physician Assistant | Primary: Physician Assistant

## 2020-04-16 DIAGNOSIS — C9100 Acute lymphoblastic leukemia not having achieved remission: Principal | ICD-10-CM

## 2020-04-16 DIAGNOSIS — Z20822 Contact with and (suspected) exposure to covid-19: Principal | ICD-10-CM

## 2020-04-16 DIAGNOSIS — C9500 Acute leukemia of unspecified cell type not having achieved remission: Principal | ICD-10-CM

## 2020-04-16 DIAGNOSIS — M549 Dorsalgia, unspecified: Principal | ICD-10-CM

## 2020-04-16 MED ORDER — HYDROMORPHONE 2 MG TABLET
ORAL_TABLET | ORAL | 0 refills | 0.00000 days | Status: CP
Start: 2020-04-16 — End: 2020-04-21

## 2020-04-17 DIAGNOSIS — C9100 Acute lymphoblastic leukemia not having achieved remission: Principal | ICD-10-CM

## 2020-04-18 MED ORDER — SULFAMETHOXAZOLE 800 MG-TRIMETHOPRIM 160 MG TABLET
ORAL_TABLET | Freq: Two times a day (BID) | ORAL | 3 refills | 84 days | Status: CP
Start: 2020-04-18 — End: ?
  Filled 2020-05-06: qty 16, 28d supply, fill #0

## 2020-04-21 ENCOUNTER — Ambulatory Visit: Admit: 2020-04-21 | Discharge: 2020-04-22 | Payer: MEDICAID

## 2020-04-21 ENCOUNTER — Ambulatory Visit: Admit: 2020-04-21 | Discharge: 2020-04-22 | Payer: MEDICAID | Attending: Adult Health | Primary: Adult Health

## 2020-04-21 ENCOUNTER — Other Ambulatory Visit: Admit: 2020-04-21 | Discharge: 2020-04-22 | Payer: MEDICAID

## 2020-04-21 DIAGNOSIS — G629 Polyneuropathy, unspecified: Principal | ICD-10-CM

## 2020-04-21 DIAGNOSIS — Z79899 Other long term (current) drug therapy: Principal | ICD-10-CM

## 2020-04-21 DIAGNOSIS — C9100 Acute lymphoblastic leukemia not having achieved remission: Principal | ICD-10-CM

## 2020-04-21 MED ORDER — PROCHLORPERAZINE MALEATE 10 MG TABLET
ORAL_TABLET | Freq: Four times a day (QID) | ORAL | 1 refills | 15 days | Status: CP | PRN
Start: 2020-04-21 — End: 2020-05-21

## 2020-04-23 DIAGNOSIS — C9100 Acute lymphoblastic leukemia not having achieved remission: Principal | ICD-10-CM

## 2020-04-23 DIAGNOSIS — C9101 Acute lymphoblastic leukemia, in remission: Principal | ICD-10-CM

## 2020-04-23 MED ORDER — DASATINIB 70 MG TABLET
ORAL_TABLET | Freq: Every day | ORAL | 7 refills | 30 days | Status: CP
Start: 2020-04-23 — End: 2020-04-30

## 2020-04-24 DIAGNOSIS — C9500 Acute leukemia of unspecified cell type not having achieved remission: Principal | ICD-10-CM

## 2020-04-24 DIAGNOSIS — C9100 Acute lymphoblastic leukemia not having achieved remission: Principal | ICD-10-CM

## 2020-04-24 MED ORDER — CARVEDILOL 12.5 MG TABLET
ORAL_TABLET | Freq: Two times a day (BID) | ORAL | 3 refills | 30 days | Status: CP
Start: 2020-04-24 — End: 2020-05-13

## 2020-04-27 DIAGNOSIS — Z86711 Personal history of pulmonary embolism: Principal | ICD-10-CM

## 2020-04-27 DIAGNOSIS — N179 Acute kidney failure, unspecified: Principal | ICD-10-CM

## 2020-04-27 DIAGNOSIS — Z8614 Personal history of Methicillin resistant Staphylococcus aureus infection: Principal | ICD-10-CM

## 2020-04-27 DIAGNOSIS — R9431 Abnormal electrocardiogram [ECG] [EKG]: Principal | ICD-10-CM

## 2020-04-27 DIAGNOSIS — I10 Essential (primary) hypertension: Principal | ICD-10-CM

## 2020-04-27 DIAGNOSIS — C9201 Acute myeloblastic leukemia, in remission: Principal | ICD-10-CM

## 2020-04-27 DIAGNOSIS — F17210 Nicotine dependence, cigarettes, uncomplicated: Principal | ICD-10-CM

## 2020-04-27 DIAGNOSIS — Z79899 Other long term (current) drug therapy: Principal | ICD-10-CM

## 2020-04-27 DIAGNOSIS — R918 Other nonspecific abnormal finding of lung field: Principal | ICD-10-CM

## 2020-04-27 DIAGNOSIS — R0600 Dyspnea, unspecified: Principal | ICD-10-CM

## 2020-04-27 DIAGNOSIS — Z20822 Contact with and (suspected) exposure to covid-19: Principal | ICD-10-CM

## 2020-04-27 DIAGNOSIS — R0602 Shortness of breath: Principal | ICD-10-CM

## 2020-04-28 ENCOUNTER — Ambulatory Visit
Admit: 2020-04-28 | Discharge: 2020-04-28 | Disposition: A | Discharge status: Home / Self Care | Payer: MEDICAID | Attending: Emergency Medicine

## 2020-04-28 ENCOUNTER — Emergency Department
Admit: 2020-04-28 | Discharge: 2020-04-28 | Disposition: A | Discharge status: Home / Self Care | Payer: MEDICAID | Attending: Emergency Medicine

## 2020-04-28 DIAGNOSIS — Z79899 Other long term (current) drug therapy: Principal | ICD-10-CM

## 2020-04-28 DIAGNOSIS — R9431 Abnormal electrocardiogram [ECG] [EKG]: Principal | ICD-10-CM

## 2020-04-28 DIAGNOSIS — Z8614 Personal history of Methicillin resistant Staphylococcus aureus infection: Principal | ICD-10-CM

## 2020-04-28 DIAGNOSIS — R0602 Shortness of breath: Principal | ICD-10-CM

## 2020-04-28 DIAGNOSIS — C9201 Acute myeloblastic leukemia, in remission: Principal | ICD-10-CM

## 2020-04-28 DIAGNOSIS — Z20822 Contact with and (suspected) exposure to covid-19: Principal | ICD-10-CM

## 2020-04-28 DIAGNOSIS — R918 Other nonspecific abnormal finding of lung field: Principal | ICD-10-CM

## 2020-04-28 DIAGNOSIS — Z86711 Personal history of pulmonary embolism: Principal | ICD-10-CM

## 2020-04-28 DIAGNOSIS — I10 Essential (primary) hypertension: Principal | ICD-10-CM

## 2020-04-28 DIAGNOSIS — C9100 Acute lymphoblastic leukemia not having achieved remission: Principal | ICD-10-CM

## 2020-04-28 DIAGNOSIS — R0689 Other abnormalities of breathing: Principal | ICD-10-CM

## 2020-04-28 DIAGNOSIS — F17210 Nicotine dependence, cigarettes, uncomplicated: Principal | ICD-10-CM

## 2020-04-28 DIAGNOSIS — N179 Acute kidney failure, unspecified: Principal | ICD-10-CM

## 2020-04-29 ENCOUNTER — Ambulatory Visit: Admit: 2020-04-29 | Discharge: 2020-04-29 | Payer: MEDICAID | Attending: Internal Medicine | Primary: Internal Medicine

## 2020-04-29 ENCOUNTER — Other Ambulatory Visit: Admit: 2020-04-29 | Discharge: 2020-04-29 | Payer: MEDICAID

## 2020-04-29 DIAGNOSIS — C9101 Acute lymphoblastic leukemia, in remission: Principal | ICD-10-CM

## 2020-04-29 DIAGNOSIS — R0603 Acute respiratory distress: Principal | ICD-10-CM

## 2020-04-29 DIAGNOSIS — Z79899 Other long term (current) drug therapy: Principal | ICD-10-CM

## 2020-04-29 DIAGNOSIS — C9100 Acute lymphoblastic leukemia not having achieved remission: Principal | ICD-10-CM

## 2020-04-29 DIAGNOSIS — R0689 Other abnormalities of breathing: Principal | ICD-10-CM

## 2020-04-29 DIAGNOSIS — N179 Acute kidney failure, unspecified: Principal | ICD-10-CM

## 2020-04-29 DIAGNOSIS — B377 Candidal sepsis: Principal | ICD-10-CM

## 2020-04-29 DIAGNOSIS — J810 Acute pulmonary edema: Principal | ICD-10-CM

## 2020-04-29 MED ORDER — POSACONAZOLE 100 MG TABLET,DELAYED RELEASE
ORAL_TABLET | Freq: Every day | ORAL | 6 refills | 30 days | Status: CP
Start: 2020-04-29 — End: 2020-05-13
  Filled 2020-04-29: qty 90, 30d supply, fill #0

## 2020-04-30 DIAGNOSIS — C9100 Acute lymphoblastic leukemia not having achieved remission: Principal | ICD-10-CM

## 2020-04-30 MED ORDER — DASATINIB 20 MG TABLET
ORAL_TABLET | 5 refills | 0 days | Status: CP
Start: 2020-04-30 — End: 2020-05-13

## 2020-05-01 ENCOUNTER — Ambulatory Visit: Admit: 2020-05-01 | Discharge: 2020-05-02 | Payer: MEDICAID

## 2020-05-01 DIAGNOSIS — J810 Acute pulmonary edema: Principal | ICD-10-CM

## 2020-05-01 DIAGNOSIS — C9100 Acute lymphoblastic leukemia not having achieved remission: Principal | ICD-10-CM

## 2020-05-05 ENCOUNTER — Ambulatory Visit: Admit: 2020-05-05 | Discharge: 2020-05-05 | Payer: MEDICAID

## 2020-05-05 ENCOUNTER — Telehealth
Admit: 2020-05-05 | Discharge: 2020-05-05 | Payer: MEDICAID | Attending: Infectious Disease | Primary: Infectious Disease

## 2020-05-05 DIAGNOSIS — A4902 Methicillin resistant Staphylococcus aureus infection, unspecified site: Principal | ICD-10-CM

## 2020-05-05 DIAGNOSIS — I079 Rheumatic tricuspid valve disease, unspecified: Principal | ICD-10-CM

## 2020-05-05 DIAGNOSIS — B377 Candidal sepsis: Principal | ICD-10-CM

## 2020-05-06 DIAGNOSIS — M79606 Pain in leg, unspecified: Principal | ICD-10-CM

## 2020-05-06 DIAGNOSIS — R5381 Other malaise: Principal | ICD-10-CM

## 2020-05-06 DIAGNOSIS — Z7409 Other reduced mobility: Principal | ICD-10-CM

## 2020-05-06 DIAGNOSIS — G8929 Other chronic pain: Principal | ICD-10-CM

## 2020-05-06 DIAGNOSIS — R2689 Other abnormalities of gait and mobility: Principal | ICD-10-CM

## 2020-05-06 DIAGNOSIS — M5440 Lumbago with sciatica, unspecified side: Principal | ICD-10-CM

## 2020-05-06 DIAGNOSIS — C9100 Acute lymphoblastic leukemia not having achieved remission: Principal | ICD-10-CM

## 2020-05-06 DIAGNOSIS — R42 Dizziness and giddiness: Principal | ICD-10-CM

## 2020-05-06 DIAGNOSIS — M25559 Pain in unspecified hip: Principal | ICD-10-CM

## 2020-05-08 DIAGNOSIS — M25559 Pain in unspecified hip: Principal | ICD-10-CM

## 2020-05-08 DIAGNOSIS — M79606 Pain in leg, unspecified: Principal | ICD-10-CM

## 2020-05-08 DIAGNOSIS — G8929 Other chronic pain: Principal | ICD-10-CM

## 2020-05-08 DIAGNOSIS — R42 Dizziness and giddiness: Principal | ICD-10-CM

## 2020-05-08 DIAGNOSIS — R2689 Other abnormalities of gait and mobility: Principal | ICD-10-CM

## 2020-05-08 DIAGNOSIS — R5381 Other malaise: Principal | ICD-10-CM

## 2020-05-08 DIAGNOSIS — M5440 Lumbago with sciatica, unspecified side: Principal | ICD-10-CM

## 2020-05-08 DIAGNOSIS — Z7409 Other reduced mobility: Principal | ICD-10-CM

## 2020-05-08 DIAGNOSIS — C9100 Acute lymphoblastic leukemia not having achieved remission: Principal | ICD-10-CM

## 2020-05-12 DIAGNOSIS — C9100 Acute lymphoblastic leukemia not having achieved remission: Principal | ICD-10-CM

## 2020-05-13 ENCOUNTER — Encounter: Admit: 2020-05-13 | Discharge: 2020-05-14 | Payer: MEDICAID

## 2020-05-13 ENCOUNTER — Other Ambulatory Visit: Admit: 2020-05-13 | Discharge: 2020-05-14 | Payer: MEDICAID

## 2020-05-13 ENCOUNTER — Encounter: Admit: 2020-05-13 | Discharge: 2020-05-14 | Payer: MEDICAID | Attending: Pharmacist | Primary: Pharmacist

## 2020-05-13 ENCOUNTER — Encounter: Admit: 2020-05-13 | Discharge: 2020-05-14 | Payer: MEDICAID | Attending: Internal Medicine | Primary: Internal Medicine

## 2020-05-13 DIAGNOSIS — C9100 Acute lymphoblastic leukemia not having achieved remission: Principal | ICD-10-CM

## 2020-05-13 DIAGNOSIS — K859 Acute pancreatitis without necrosis or infection, unspecified: Principal | ICD-10-CM

## 2020-05-13 DIAGNOSIS — N179 Acute kidney failure, unspecified: Principal | ICD-10-CM

## 2020-05-13 DIAGNOSIS — N189 Chronic kidney disease, unspecified: Principal | ICD-10-CM

## 2020-05-13 DIAGNOSIS — R0601 Orthopnea: Principal | ICD-10-CM

## 2020-05-13 DIAGNOSIS — Z79899 Other long term (current) drug therapy: Principal | ICD-10-CM

## 2020-05-13 DIAGNOSIS — F418 Other specified anxiety disorders: Principal | ICD-10-CM

## 2020-05-13 DIAGNOSIS — Z9221 Personal history of antineoplastic chemotherapy: Principal | ICD-10-CM

## 2020-05-13 DIAGNOSIS — B9562 Methicillin resistant Staphylococcus aureus infection as the cause of diseases classified elsewhere: Principal | ICD-10-CM

## 2020-05-13 DIAGNOSIS — R0689 Other abnormalities of breathing: Principal | ICD-10-CM

## 2020-05-13 DIAGNOSIS — R0603 Acute respiratory distress: Principal | ICD-10-CM

## 2020-05-13 DIAGNOSIS — C9101 Acute lymphoblastic leukemia, in remission: Principal | ICD-10-CM

## 2020-05-13 DIAGNOSIS — C9500 Acute leukemia of unspecified cell type not having achieved remission: Principal | ICD-10-CM

## 2020-05-13 MED ORDER — PONATINIB 30 MG TABLET
ORAL_TABLET | Freq: Every day | ORAL | 5 refills | 30 days | Status: CP
Start: 2020-05-13 — End: 2020-06-12

## 2020-05-13 MED ORDER — CARVEDILOL 12.5 MG TABLET
ORAL_TABLET | Freq: Two times a day (BID) | ORAL | 3 refills | 30 days | Status: CP
Start: 2020-05-13 — End: ?
  Filled 2020-06-16: qty 60, 30d supply, fill #0

## 2020-05-14 ENCOUNTER — Encounter: Admit: 2020-05-14 | Discharge: 2020-06-11 | Payer: MEDICAID

## 2020-05-14 ENCOUNTER — Ambulatory Visit: Admit: 2020-05-14 | Discharge: 2020-06-11 | Payer: MEDICAID

## 2020-05-14 ENCOUNTER — Ambulatory Visit
Admit: 2020-05-14 | Discharge: 2020-06-11 | Payer: MEDICAID | Attending: Rehabilitative and Restorative Service Providers" | Primary: Rehabilitative and Restorative Service Providers"

## 2020-05-14 ENCOUNTER — Ambulatory Visit
Admit: 2020-05-14 | Discharge: 2020-06-11 | Payer: MEDICAID | Attending: Student in an Organized Health Care Education/Training Program | Primary: Student in an Organized Health Care Education/Training Program

## 2020-05-14 ENCOUNTER — Encounter
Admit: 2020-05-14 | Discharge: 2020-06-11 | Payer: MEDICAID | Attending: Rehabilitative and Restorative Service Providers" | Primary: Rehabilitative and Restorative Service Providers"

## 2020-05-14 DIAGNOSIS — C9100 Acute lymphoblastic leukemia not having achieved remission: Principal | ICD-10-CM

## 2020-05-14 DIAGNOSIS — R5381 Other malaise: Principal | ICD-10-CM

## 2020-05-14 DIAGNOSIS — Z7409 Other reduced mobility: Principal | ICD-10-CM

## 2020-05-15 DIAGNOSIS — C9100 Acute lymphoblastic leukemia not having achieved remission: Principal | ICD-10-CM

## 2020-05-15 DIAGNOSIS — R0602 Shortness of breath: Principal | ICD-10-CM

## 2020-05-15 MED ORDER — FUROSEMIDE 20 MG TABLET
ORAL_TABLET | Freq: Once | ORAL | 0 refills | 0 days | Status: CP | PRN
Start: 2020-05-15 — End: ?

## 2020-05-18 MED ORDER — DASATINIB 70 MG TABLET
ORAL_TABLET | Freq: Every day | ORAL | 0 refills | 30 days | Status: CP
Start: 2020-05-18 — End: ?

## 2020-05-21 DIAGNOSIS — C9100 Acute lymphoblastic leukemia not having achieved remission: Principal | ICD-10-CM

## 2020-05-22 ENCOUNTER — Ambulatory Visit: Admit: 2020-05-22 | Discharge: 2020-05-23 | Payer: MEDICAID

## 2020-05-22 ENCOUNTER — Other Ambulatory Visit: Admit: 2020-05-22 | Discharge: 2020-05-23 | Payer: MEDICAID

## 2020-05-22 DIAGNOSIS — C9100 Acute lymphoblastic leukemia not having achieved remission: Principal | ICD-10-CM

## 2020-05-25 ENCOUNTER — Telehealth
Admit: 2020-05-25 | Discharge: 2020-05-26 | Payer: MEDICAID | Attending: Student in an Organized Health Care Education/Training Program | Primary: Student in an Organized Health Care Education/Training Program

## 2020-05-25 NOTE — Unmapped (Signed)
[facility]  Comprehensive Cancer Support Program/Psychiatry   New Patient Evaluation - Outpatient    Name: Adam Keith  Date: 05/25/2020  MRN: [id number]  DOB: 1980-05-23  REFERRING PROVIDER: Self, Referred    Assessment:   Adam Keith is a  40 y.o. 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 The course was complicated by septic shock, candida krusei fungemia, MRSA bacteremia with septic emboli c/b acute renal failure and respiratory distress requiring dialysis and intubation. Current plan for ponatinib/blinatumumab in context of concurrent posaconazole treatment., referred by Benita Gutter, MD for evaluation of medication recommendations for depression.    On chart review and initial clinical encounter the patient's presentation with low mood, anxiety, irritability, and mild anhedonia in the context of ongoing cancer care, consistent with Adjustment disorder with mixed depressed and anxious mood. He reports long-standing history of hypervigilance, avoidance, survivor's guilt, and intermittent truamatic memories consistent with chronic PTSD. The patient is not interested in continuing current medication regimen at this time due to lack of perceived efficacy, but is open to psychotherapy. He is open to continuing with this provider, but could also be well-served by trauma-focused psychotherapy in the community.    I have reviewed this patient's records including medical and psychiatric history, imaging, medication history, and when applicable, the PDMP and summarized above.    Risk Assessment:  A suicide and violence risk assessment was performed as part of this evaluation. There patient is deemed to be at chronic elevated risk for self-harm/suicide given the following factors: chronic severe medical condition, chronic mental illness > 5 years and rigid thinking. There patient is deemed to be at chronic elevated risk for violence given the following factors: male gender and younger age. These risk factors are mitigated by the following factors:lack of active SI/HI, no history of previous suicide attempts , motivation for treatment, sense of responsibility to family and social supports, minor children living at home, enjoyment of leisure actvities, expresses purpose for living, effective problem solving skills, safe housing and support system in agreement with treatment recommendations. There is no acute risk for suicide or violence at this time. The patient was educated about relevant modifiable risk factors including following recommendations for treatment of psychiatric illness and abstaining from substance abuse. While future psychiatric events cannot be accurately predicted, the patient does not currently require  acute inpatient psychiatric care and does not currently meet [facility] involuntary commitment criteria.     Diagnoses:   Patient Active Problem List   Diagnosis   ??? Anxiety   ??? Tobacco use disorder   ??? AKI (acute kidney injury) (CMS-HCC)   ??? Transaminitis   ??? 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 Hyperphosphatemia   ??? MRSA bacteremia   ??? Septic shock due to Staphylococcus aureus (CMS-HCC)   ??? Hyperkalemia   ??? Hiccups   ??? Hypernatremia   ??? Red blood cell antibody positive        Stressors: ongoing cancer care, inability to work as EMT, medical sequela of cancer treatment    Disability Assessment Scale: estimated moderate     Plan:    #Adjustment disorder - # PTSD  -- STOP Cymbalta given pt preference  -- Continue Ativan 0.5mg  every day PRN --> will consider taper after establishing clinical stability  -- Begin ACT psychotherapy starting at next visit    Patient was provided with information regarding available CCSP resources.  Follow-up appointment on 06/15/20.    Revised Medication(s) Post Visit:  Outpatient Encounter Medications as of 05/25/2020   Medication Sig Dispense Refill   ??? acetaminophen (TYLENOL) 325 MG tablet Take 650 mg by mouth every six (6) hours as needed for pain.      ??? amLODIPine (NORVASC) 10 MG tablet Take 1 tablet (10 mg total) by mouth daily. 30 tablet 0   ??? carvediloL (COREG) 12.5 MG tablet Take 1 tablet (12.5 mg total) by mouth Two (2) times a day. 60 tablet 3   ??? dasatinib (SPRYCEL) 70 MG tablet Take 2 tablets (140 mg total) by mouth daily. 60 tablet 0   ??? DULoxetine (CYMBALTA) 30 MG capsule Take 1 capsule (30 mg total) by mouth Two (2) times a day. 60 capsule 0   ??? famotidine (PEPCID) 20 MG tablet Take 1 tablet (20 mg total) by mouth daily. Take 2 hours after Sprycel dose 30 tablet 11   ??? fluticasone propionate (FLONASE) 50 mcg/actuation nasal spray 1 spray into each nostril Two (2) times a day. 16 g 0   ??? furosemide (LASIX) 20 MG tablet Take 1 tablet (20 mg total) by mouth once as needed (Difficulty breathing) for up to 1 dose. 30 tablet 0   ??? HYDROmorphone (DILAUDID) 2 MG tablet Take 1 tablet by mouth nightly.     ??? LORazepam (ATIVAN) 0.5 MG tablet Take 1 tablet (0.5 mg total) by mouth daily. 30 tablet 0   ??? meclizine (ANTIVERT) 25 mg tablet Take 1 tablet (25 mg total) by mouth Three (3) times a day. 90 tablet 2   ??? mirtazapine (REMERON) 15 MG tablet Take 1 tablet (15 mg total) by mouth nightly. 30 tablet 3   ??? ondansetron (ZOFRAN) 8 MG tablet Take 1 tablet (8 mg total) by mouth every eight (8) hours as needed for nausea. 60 tablet 2   ??? PONATinib (ICLUSIG) 30 mg tablet Take 1 tablet (30 mg total) by mouth daily. Swallow tablets whole. Do not crush, break, cut or chew tablets. 30 tablet 5   ??? [EXPIRED] prochlorperazine (COMPAZINE) 10 MG tablet Take 1 tablet (10 mg total) by mouth every six (6) hours as needed for nausea (if no relief from zofran (ondansetron)). 60 tablet 1   ??? sodium chloride (OCEAN) 0.65 % nasal spray 1 spray into each nostril Three (3) times a day. 44 mL 0   ??? sulfamethoxazole-trimethoprim (BACTRIM DS) 800-160 mg per tablet Take 1 tablet (160 mg of trimethoprim total) by mouth 2 times a day on Saturday, Sunday. For prophylaxis while on chemo. 48 tablet 3   ??? valACYclovir (VALTREX) 500 MG tablet Take 1 tablet (500 mg total) by mouth daily. 90 tablet 3     No facility-administered encounter medications on file as of 05/25/2020.       Patient was provided with my contact information. He/she was instructed to call [phone] for emergencies.     Thank you for allowing me to participate in the care of this patient  Russ Halo, MD    Subjective:    Psychiatric Chief Concern:  Initial evaluation    HPI:   -- Spoke to patient over video chat for  -- He reports that he has prior diagnoses of PTSD and developed depression after being diagnosed with ALL in October 2021  -- He reports that he was hospitalized for ???multi-system failure, needed to be on a ventilator. It just really took a toll on my body. And I am now  immunocompromised and can???t really do anything outside the house???  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 I used to be the one that everybody comes to, but I can???t do that anymore???  -- He reports getting a call from his EMT colleague that they found a person that had committed suicide after getting diagnosed with cancer, ???and I get it. I don???t want to do it, but I get it. It changes your whole life???  -- He endorses that ???I have a lot to live for. I want to walk my daughters down the aisle, I want to see them get through college, spend time on his cabin boat with his family  -- He reports that his depression presents as: ???feeling all alone, feeling off, crying??? He states that sleep has been fine and that Dilaudid 2mg  is helpful for sleep. He endorses his energy level is ???good in the morning but wears out around lunch and little more energy in the afternoon???. He reports his appetite is ???touch and go???, as he has noticing a change in his taste from chemotherapy. He reports intermittent nausea, but denies emesis. 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 He endorses numbness/tingling in fingers and toes. 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 He states that ???I am just waiting for things to go back to where they were???  -- The patient values being: Hardworking, independent  -- He really enjoys woodworking and has been making tables, clocks, cutting boards, etc.   -- He reports PTSD related to serving in the [employer], but did not see combat. 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 He reports additional trauma related to the death of his son in March 06, 2021who died from a gun-related accident. He is open to discussing these stressors more in the future  -- He denies previous psychiatric hospitalization or consistent treatment with a psychiatrist or therapist  -- Discussed with patient that his symptoms are most consistent with adjustment disorder, and that medication may not be necessary if he is not interested. He reports that he would like to trial off Cymbalta for now and will continue to work with this provider on psychotherapeutic techniques. He is open to considering trial of SSRI in the future if indicated.    Prior medication trials:  - Currently Cymbalta 30mg  BID  - Previously Wellbutrin (unable to tolerate SE)    Allergies:  Bupropion hcl, Dapsone, Onion, Vancomycin analogues, Bismuth subsalicylate, Ceftaroline fosamil, and Furosemide    Medications:   No current facility-administered medications for this visit.     No current outpatient medications on file.     Facility-Administered Medications Ordered in Other Visits   Medication Dose Route Frequency Provider Last Rate Last Admin   ??? [START ON 06/20/2020] amLODIPine (NORVASC) tablet 10 mg  10 mg Oral Daily Herbert Moors, [place]       ??? carvediloL (COREG) tablet 12.5 mg  12.5 mg Oral BID Herbert Moors, AGNP       ??? hydrOXYzine (ATARAX) tablet 25 mg  25 mg  Oral Q6H PRN Herbert Moors, AGNP       ??? loperamide (IMODIUM) capsule 2 mg  2 mg Oral Q2H PRN Herbert Moors, AGNP       ??? loperamide (IMODIUM) capsule 4 mg  4 mg Oral Once PRN Herbert Moors, AGNP       ??? magnesium sulfate 2gm/63mL IVPB  2 g Intravenous Once Herbert Moors, AGNP 25 mL/hr at 06/19/20 1348 2 g at 06/19/20 1348   ??? mirtazapine (REMERON) tablet 15 mg  15 mg Oral Nightly Herbert Moors, [place]       ??? [START ON 06/20/2020] predniSONE (DELTASONE) tablet 50 mg  50 mg Oral Daily Herbert Moors, AGNP       ??? sodium chloride (NS) 0.9 % flush 10 mL  10 mL Intravenous BID Herbert Moors, AGNP       ??? sodium chloride (NS) 0.9 % flush 10 mL  10 mL Intravenous BID Herbert Moors, AGNP       ??? sodium chloride (NS) 0.9 % flush 10 mL  10 mL Intravenous BID Herbert Moors, AGNP       ??? sodium chloride (NS) 0.9 % infusion  20 mL/hr Intravenous Continuous Herbert Moors, AGNP       ??? [START ON 06/20/2020] sulfamethoxazole-trimethoprim (BACTRIM DS) 800-160 mg tablet 160 mg of trimethoprim  1 tablet Oral BID Herbert Moors, AGNP       ??? [START ON 06/20/2020] valACYclovir (VALTREX) tablet 500 mg  500 mg Oral Daily Herbert Moors, [place]           Psychiatric/Medical History:  Past Medical History:   Diagnosis Date   ??? 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 BONE MARROW BIOPSY & ASPIRATION  01/21/2020        ??? IR INSERT PORT AGE GREATER THAN 5 YRS  03/10/2020    IR INSERT PORT AGE GREATER THAN 5 YRS 03/10/2020 [name], MD IMG VIR [facility]       Social History:  Social History     Socioeconomic History   ??? Marital status: Married     Spouse name: Not on file   ??? Number of children: Not on file   ??? Years of education: Not on file   ??? Highest education level: Not on file   Occupational History   ??? Not on file   Tobacco Use   ??? Smoking status: Current Every Day Smoker     Packs/day: 2.00     Types: Cigarettes   ??? Smokeless tobacco: Former Neurosurgeon     Types: Financial planner   ??? Vaping Use: Never used   Substance and Sexual Activity   ??? Alcohol use: Not on file   ??? Drug use: Not on file   ??? Sexual activity: Not on file   Other Topics Concern   ??? Not on file   Social History Narrative   ??? Not on file     Social Determinants of Health     Financial Resource Strain: Low Risk    ??? Difficulty of Paying Living Expenses: Not very hard   Food Insecurity: No Food Insecurity   ??? Worried About Running Out of Food in the Last Year: Never true   ??? Ran Out of Food in the Last Year: Never  true   Transportation Needs: No Transportation Needs   ??? Lack of Transportation (Medical): No   ??? Lack of Transportation (Non-Medical): No   Physical Activity: Not on file   Stress: Not on file   Social Connections: Not on file       Family History:  The patient's family history is not on file..    ROS: per interview; otherwise 12pt ROS negative     Objective:     Vitals:   There were no vitals filed for this visit.    Mental Status Exam:  Appearance:    Appears stated age   Motor:   No abnormal movements   Speech/Language:    Normal rate, volume, tone, fluency   Mood:   alright   Affect:    Decreased range   Thought process:   Logical, linear, clear, coherent, goal directed   Thought content:     Denies SI, HI, self harm, delusions, obsessions, paranoid ideation, or ideas of reference   Perceptual disturbances:     Denies auditory and visual hallucinations, behavior not concerning for response to internal stimuli     Orientation:   Oriented to person, place, time, and general circumstances   Attention:   Able to fully attend without fluctuations in consciousness   Concentration:   Able to fully concentrate and attend   Memory:   Immediate, short-term, long-term, and recall grossly intact    Fund of knowledge:    Consistent with level of education and development   Insight:     Intact   Judgment:    Intact   Impulse Control:   Intact     PE:   Gen - in NAD  Pulm - normal work of breathing  Neuro - moving all extremities spontaneously, EOM grossly intact to conversation without nystagmus, no tremor, normal gait    Test Results:  Data Review:   CBC - Results in Past 2 Days  Result Component Current Result   WBC 16.9 (H) (06/19/2020)   RBC 2.53 (L) (06/19/2020)   HGB 8.2 (L) (06/19/2020)   HCT 23.3 (L) (06/19/2020)   MCV 92.1 (06/19/2020)   MCH 32.4 (06/19/2020)   MCHC 35.2 (06/19/2020)   MPV 7.2 (06/19/2020)   Platelet 294 (06/19/2020)     CMP-  Results in Past 2 Days  Result Component Current Result   Sodium 139 (06/19/2020)   Potassium 3.4 (06/19/2020)   Chloride 105 (06/19/2020)   CO2 22.0 (06/19/2020)   BUN 28 (H) (06/19/2020)   Creatinine 2.02 (H) (06/19/2020)   EST.GFR (MDRD) Not in Time Range   Glucose 96 (06/19/2020)   Calcium 8.2 (L) (06/19/2020)   Albumin 3.6 (06/19/2020)   Total Protein 6.2 (06/19/2020)   Total Bilirubin 0.3 (06/19/2020)   ALT 16 (06/19/2020)   AST 19 (06/19/2020)   Alkaline Phosphatase 81 (06/19/2020)     HgbA1C-   No results found for requested labs within last 2 days.     Lipid Panel (Non-fasting)-   No results found for requested labs within last 2 days.     LFT's - Results in Past 2 Days  Result Component Current Result   Albumin 3.6 (06/19/2020)   ALT 16 (06/19/2020)   AST 19 (06/19/2020)   Alkaline Phosphatase 81 (06/19/2020)   Total Bilirubin 0.3 (06/19/2020)   Bilirubin, Direct <0.10 (06/19/2020)    Not in Time Range     Imaging: None     Encounter Description/Consent: This encounter was conducted from provider's home via  live, face-to-face video conference with the patient. Jaques Mineer was located in [place]. Discussed the choice to participate in care through the use of telepsychiatry service. Telepsychiatry enables health care providers at different locations to provide safe, effective, and convenient care through the use of technology. As with any health care service, there are risks associated with the use of telepsychiatry, including intermittent difficulty with visualization and that there may be instances were they need to come to clinic to complete the assessment. Patient verbally understands the risks and benefits of telepsychaitry as explained. All questions regarding telepsychiatry answered.    I notified him that because this is a special type of visit, he may get a bill for a copay or coinsurance. He is OK with proceeding.    Time Spent: 45 minutes      Russ Halo, MD  05/25/2020

## 2020-05-27 ENCOUNTER — Ambulatory Visit: Admit: 2020-05-27 | Payer: MEDICAID

## 2020-05-28 ENCOUNTER — Ambulatory Visit
Admit: 2020-05-28 | Discharge: 2020-06-26 | Payer: MEDICAID | Attending: Rehabilitative and Restorative Service Providers" | Primary: Rehabilitative and Restorative Service Providers"

## 2020-05-28 ENCOUNTER — Ambulatory Visit: Admit: 2020-05-28 | Discharge: 2020-05-29 | Payer: MEDICAID | Attending: Adult Health | Primary: Adult Health

## 2020-05-28 ENCOUNTER — Ambulatory Visit: Admit: 2020-05-28 | Discharge: 2020-05-29 | Payer: MEDICAID | Attending: Ophthalmology | Primary: Ophthalmology

## 2020-05-28 ENCOUNTER — Other Ambulatory Visit: Admit: 2020-05-28 | Discharge: 2020-05-29 | Payer: MEDICAID

## 2020-05-28 ENCOUNTER — Ambulatory Visit
Admit: 2020-05-28 | Discharge: 2020-05-29 | Payer: MEDICAID | Attending: Physician Assistant | Primary: Physician Assistant

## 2020-05-28 ENCOUNTER — Ambulatory Visit: Admit: 2020-05-28 | Discharge: 2020-06-26 | Payer: MEDICAID

## 2020-05-28 ENCOUNTER — Ambulatory Visit: Admit: 2020-05-28 | Discharge: 2020-05-29 | Payer: MEDICAID

## 2020-05-28 DIAGNOSIS — C9100 Acute lymphoblastic leukemia not having achieved remission: Principal | ICD-10-CM

## 2020-05-29 DIAGNOSIS — U071 COVID-19: Principal | ICD-10-CM

## 2020-05-29 MED ORDER — MOLNUPIRAVIR 200 MG CAPSULE (EUA)
ORAL_CAPSULE | Freq: Two times a day (BID) | ORAL | 0 refills | 5 days | Status: CP
Start: 2020-05-29 — End: 2020-06-03
  Filled 2020-05-29: qty 40, 5d supply, fill #0

## 2020-05-31 ENCOUNTER — Ambulatory Visit: Admit: 2020-05-31 | Discharge: 2020-06-01 | Payer: MEDICAID

## 2020-06-02 NOTE — Unmapped (Addendum)
Hello,    Please scheduled patient Srihari Shellhammer on Friday, June 19, 2020 for a SCHED ADM THROUGH INF at  11:00 AM     ?? Reason for Admission: Scheduled chemotherapy   ?? Primary Diagnosis:  Encounter for antineoplastic chemotherapy for [place] positive acute lymphoblastic leukemia  ?? Primary Diagnosis ICD-10 Code:  Z51.11 & C91.00  ?? Expected length of stay: 5 Days   ?? CPT Code:  29518   ?? CPT Code Description: Under Injection and Intravenous Infusion Chemotherapy and Other Highly Complex Drug or Highly Complex Biologic Agent Administration   ?? Treating Attending: Dr. Cristal Deer Dittus  ?? Need for PICC placement in Infusion? No    Infusion Scheduling: Please notify the patient of the appointment date and time once scheduled. Please document this information within this encounter and then route to the navigator prior to closing the encounter.       Scheduled Admissions Assessment        Assessment  Response  Intervention    Type of insurance  Medicare Financial counselor referral is not warranted this time.     Reliable Trasportation  Yes Referral for Social Work is not warranted this time.   Central Line Access  Yes Patient has line access and does not require an intervention at this time     Other Pertinent Information:  Start cycle 1 of blinatumomab and ponatinib (patient supplied)      ?? Originally positive for COVID-19 on 05/28/2020 and received monoclonal antibody therapy.    ?? IT chemotherapy was originally administered on 05/28/2020 in conjunction with this cycle of therapy.    ?? Blinatumomab bag changes will be provided through the infusion center at [facility].    ?? Anticipate repeat bone marrow biopsy on week 5 of therapy.    ?? Clinic follow-up with the Leukemia Care Team to be set for week 6.    Thank you,  Arvid Right

## 2020-06-04 DIAGNOSIS — L270 Generalized skin eruption due to drugs and medicaments taken internally: Principal | ICD-10-CM

## 2020-06-04 MED ORDER — TRIAMCINOLONE ACETONIDE 0.1 % TOPICAL CREAM
Freq: Two times a day (BID) | TOPICAL | 1 refills | 0.00000 days | Status: CP
Start: 2020-06-04 — End: 2021-06-04

## 2020-06-05 DIAGNOSIS — L270 Generalized skin eruption due to drugs and medicaments taken internally: Principal | ICD-10-CM

## 2020-06-05 MED ORDER — PREDNISONE 10 MG TABLET
ORAL_TABLET | Freq: Every day | ORAL | 0 refills | 5.00000 days | Status: CP
Start: 2020-06-05 — End: 2020-06-10

## 2020-06-12 ENCOUNTER — Other Ambulatory Visit: Admit: 2020-06-12 | Discharge: 2020-06-12 | Payer: MEDICAID

## 2020-06-12 ENCOUNTER — Ambulatory Visit: Admit: 2020-06-12 | Discharge: 2020-06-12 | Payer: MEDICAID | Attending: Dermatology | Primary: Dermatology

## 2020-06-12 DIAGNOSIS — C9100 Acute lymphoblastic leukemia not having achieved remission: Principal | ICD-10-CM

## 2020-06-12 DIAGNOSIS — U071 COVID-19: Principal | ICD-10-CM

## 2020-06-12 DIAGNOSIS — R21 Rash and other nonspecific skin eruption: Principal | ICD-10-CM

## 2020-06-12 MED ORDER — PREDNISONE 10 MG TABLET
ORAL_TABLET | ORAL | 0 refills | 0.00000 days | Status: CP
Start: 2020-06-12 — End: ?

## 2020-06-12 MED ORDER — TRIAMCINOLONE ACETONIDE 0.1 % TOPICAL CREAM
Freq: Two times a day (BID) | TOPICAL | 1 refills | 0.00000 days | Status: CP
Start: 2020-06-12 — End: 2021-06-12

## 2020-06-12 MED ORDER — HYDROCORTISONE 2.5 % TOPICAL CREAM
Freq: Two times a day (BID) | TOPICAL | 0 refills | 0.00000 days | Status: CP
Start: 2020-06-12 — End: 2021-06-12

## 2020-06-12 MED ORDER — HYDROXYZINE HCL 25 MG TABLET
ORAL_TABLET | Freq: Four times a day (QID) | ORAL | 1 refills | 15.00000 days | Status: CP | PRN
Start: 2020-06-12 — End: 2020-07-12

## 2020-06-14 DIAGNOSIS — H9190 Unspecified hearing loss, unspecified ear: Principal | ICD-10-CM

## 2020-06-15 ENCOUNTER — Telehealth
Admit: 2020-06-15 | Discharge: 2020-06-16 | Payer: MEDICAID | Attending: Student in an Organized Health Care Education/Training Program | Primary: Student in an Organized Health Care Education/Training Program

## 2020-06-15 DIAGNOSIS — R21 Rash and other nonspecific skin eruption: Principal | ICD-10-CM

## 2020-06-15 MED ORDER — TRAMADOL 50 MG TABLET
0 days
Start: 2020-06-15 — End: ?

## 2020-06-16 ENCOUNTER — Ambulatory Visit: Admit: 2020-06-16 | Discharge: 2020-06-17 | Payer: MEDICAID

## 2020-06-16 DIAGNOSIS — C9101 Acute lymphoblastic leukemia, in remission: Principal | ICD-10-CM

## 2020-06-16 LAB — CBC W/ AUTO DIFF
BASOPHILS ABSOLUTE COUNT: 0.1 10*9/L (ref 0.0–0.1)
BASOPHILS RELATIVE PERCENT: 0.9 %
EOSINOPHILS ABSOLUTE COUNT: 0.1 10*9/L (ref 0.0–0.5)
EOSINOPHILS RELATIVE PERCENT: 0.9 %
HEMATOCRIT: 24 % — ABNORMAL LOW (ref 39.0–48.0)
HEMOGLOBIN: 8.7 g/dL — ABNORMAL LOW (ref 12.9–16.5)
LYMPHOCYTES ABSOLUTE COUNT: 1.6 10*9/L (ref 1.1–3.6)
LYMPHOCYTES RELATIVE PERCENT: 22.4 %
MEAN CORPUSCULAR HEMOGLOBIN CONC: 36.3 g/dL — ABNORMAL HIGH (ref 32.0–36.0)
MEAN CORPUSCULAR HEMOGLOBIN: 34.2 pg — ABNORMAL HIGH (ref 25.9–32.4)
MEAN CORPUSCULAR VOLUME: 94.3 fL (ref 77.6–95.7)
MEAN PLATELET VOLUME: 7.5 fL (ref 6.8–10.7)
MONOCYTES ABSOLUTE COUNT: 0.6 10*9/L (ref 0.3–0.8)
MONOCYTES RELATIVE PERCENT: 8.8 %
NEUTROPHILS ABSOLUTE COUNT: 4.7 10*9/L (ref 1.8–7.8)
NEUTROPHILS RELATIVE PERCENT: 67 %
NUCLEATED RED BLOOD CELLS: 3 /100{WBCs} (ref <=4)
PLATELET COUNT: 301 10*9/L (ref 150–450)
RED BLOOD CELL COUNT: 2.54 10*12/L — ABNORMAL LOW (ref 4.26–5.60)
RED CELL DISTRIBUTION WIDTH: 17.1 % — ABNORMAL HIGH (ref 12.2–15.2)
WBC ADJUSTED: 7 10*9/L (ref 3.6–11.2)

## 2020-06-16 LAB — COMPREHENSIVE METABOLIC PANEL
ALBUMIN: 3.5 g/dL (ref 3.4–5.0)
ALKALINE PHOSPHATASE: 72 U/L (ref 46–116)
ALT (SGPT): 20 U/L (ref 10–49)
ANION GAP: 9 mmol/L (ref 5–14)
AST (SGOT): 17 U/L (ref <=34)
BILIRUBIN TOTAL: 0.3 mg/dL (ref 0.3–1.2)
BLOOD UREA NITROGEN: 21 mg/dL (ref 9–23)
BUN / CREAT RATIO: 10
CALCIUM: 8.9 mg/dL (ref 8.7–10.4)
CHLORIDE: 107 mmol/L (ref 98–107)
CO2: 24 mmol/L (ref 20.0–31.0)
CREATININE: 2.19 mg/dL — ABNORMAL HIGH
EGFR CKD-EPI AA MALE: 42 mL/min/{1.73_m2} — ABNORMAL LOW (ref >=60)
EGFR CKD-EPI NON-AA MALE: 36 mL/min/{1.73_m2} — ABNORMAL LOW (ref >=60)
GLUCOSE RANDOM: 111 mg/dL (ref 70–179)
POTASSIUM: 3.1 mmol/L — ABNORMAL LOW (ref 3.4–4.8)
PROTEIN TOTAL: 6.2 g/dL (ref 5.7–8.2)
SODIUM: 140 mmol/L (ref 135–145)

## 2020-06-16 LAB — SLIDE REVIEW

## 2020-06-16 LAB — HHV-6 PCR, BLOOD: HHV6 PCR, BLOOD: NEGATIVE

## 2020-06-16 LAB — PERIPHERAL BLOOD SMEAR, PATH REVIEW

## 2020-06-16 MED ORDER — PONATINIB 30 MG TABLET
ORAL_TABLET | Freq: Every day | ORAL | 5 refills | 30 days | Status: CP
Start: 2020-06-16 — End: ?
  Filled 2020-07-07: qty 30, 30d supply, fill #0

## 2020-06-16 MED FILL — MIRTAZAPINE 15 MG TABLET: ORAL | 30 days supply | Qty: 30 | Fill #1

## 2020-06-16 MED FILL — SULFAMETHOXAZOLE 800 MG-TRIMETHOPRIM 160 MG TABLET: ORAL | 28 days supply | Qty: 16 | Fill #1

## 2020-06-16 MED FILL — VALACYCLOVIR 500 MG TABLET: ORAL | 30 days supply | Qty: 30 | Fill #1

## 2020-06-16 NOTE — Unmapped (Signed)
Labs show resolution of eosinophilia from blood draw on 3/11, as well as consistently normal LFTs and taken in combination with his improved symptoms would continue to suspect DRESS but gratefully this does not at present need a dose escalation of prednisone.   We may consider a longer taper if symptoms recur as we dose reduce.     Still not clear inciting agent.     AWF

## 2020-06-16 NOTE — Unmapped (Signed)
As requested, I have contacted the patient to review the following:    ?? Verified that today's visit to the infusion center and cancel due to treatment plan changes.    ?? Earlier today, repeat lab work was completed as a walk-in visit to the [facility] pertaining to the following testing:  ?? CBC with differential   ?? CMP  ?? HHV-6 PCR    ?? Telehealth follow-up with [name] remains tentatively set for Tuesday, June 17, 2020.    ?? Scheduled admission to start ponatinib/blinatumomab remains tentatively scheduled for Friday, June 19, 2020.    ?? Otherwise, Adam Keith reports that he is doing well today, accomplishing some woodworking activities and looking forward to speaking with [name] tomorrow to review the leukemia care plan.    Should Adam Keith have any other questions or concerns, he will contact the clinic back at that time.    No other actions taken and this updates been shared with the Leukemia Care Team.

## 2020-06-16 NOTE — Unmapped (Signed)
Completed.

## 2020-06-16 NOTE — Unmapped (Signed)
[facility] Specialty Medication Onboarding    Specialty Medication: Iclusig 30mg  tablet  Prior Authorization: Not Required   Financial Assistance: No - copay  <$25  Final Copay/Day Supply: $3 / 30 days    Insurance Restrictions: None     Notes to Pharmacist:     The triage team has completed the benefits investigation and has determined that the patient is able to fill this medication at [facility]. Please contact the patient to complete the onboarding or follow up with the prescribing physician as needed.

## 2020-06-16 NOTE — Unmapped (Signed)
The patient reports they are currently: at home. I spent 29 minutes on the real-time audio and video with the patient on the date of service. I spent an additional 5 minutes on pre- and post-visit activities on the date of service.     The patient was physically located in [place] or a state in which I am permitted to provide care. The patient and/or parent/guardian understood that s/he may incur co-pays and cost sharing, and agreed to the telemedicine visit. The visit was reasonable and appropriate under the circumstances given the patient's presentation at the time.    The patient and/or parent/guardian has been advised of the potential risks and limitations of this mode of treatment (including, but not limited to, the absence of in-person examination) and has agreed to be treated using telemedicine. The patient's/patient's family's questions regarding telemedicine have been answered.     If the visit was completed in an ambulatory setting, the patient and/or parent/guardian has also been advised to contact their provider???s office for worsening conditions, and seek emergency medical treatment and/or call 911 if the patient deems either necessary.        [facility]    Patient Name: Adam Keith  Patient Age: 40 y.o.  Encounter Date: 06/17/2020    Primary Care Provider:  [name], MD    Referring Physician:  Referred Self  No address on file    Reason for visit:  Ph+ B-ALL follow up to consider timing of therapy change    Assessment:  A 39 y.o. 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 The course was complicated by septic shock, candida krusei fungemia, MRSA bacteremia with septic emboli c/b acute renal failure and respiratory distress requiring dialysis and intubation.  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  He has subsequently had difficulty tolerating single agent dasatinib (as a bridge to planned ponatinib-blinatumomab--had fluid retention and pleural effusion).  Most recently he developed COVID-19 and what may be a drug reaction (DRESS, potentially to sotrovimab).  Needless to say, his performance status, general frailty, and CKD have not improved.   He remains a less ideal candidate for additional hyperCVAD-based therapy than even a few weeks ago.  Thus the rationale for moving to a more targeted but also highly efficacious approach such as blinatumomab and ponatinib remains stronger than ever.    I discussed that he is currently on no active antileukemia therapy.  The leukemia will not remain quiescent for very long unless we pivoted quickly to reinstitute therapy.  I would like for his skin condition to improve somewhat before starting blinatumomab and ponatinib, and it appears that it is improving on prednisone taper. Due to aversion to topical therapies he is not amenable to be admitted to the hospital for wet wraps for the most rapid improvement.  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  - LP with IT triple therapy as soon as possible and then 1x per cycle  -could consider intensification of therapy at later date, depending on continued PS and renal function improvements    DRESS:  Appreciate consultation with Dr. Caryn Section.  He will continue prednisone taper and PRN hydroxyzine.  I recommended neosporin for areas of excoriation.  --timing of onset occurred proximate to time when dose of dasatinib was being adjusted, posaconazole given and sotrovimab.  Likely one of these changes was responsible for the DRESS    Dyspnea, PND, edema, pleural effusion:  Likely due to dasatinib.  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 Now s/p dialysis  - stable Cr appears to have developed CKD due to incompletely healed acute injury.  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  She feels that the risk of recurrent fungal infection is low if we can avoid neutropenia.  I expect little to no neutropenia with the planned therapy so I discontinued posaconazole.      Symptom management: bowel regimen as listed in medication list, analgesics as listed in medication list or symptoms reviewed and due to acceptable control, no changes made  **Peripheral neuropathy, sensory and motor  - Grade 1-2 monitor  **Chronic Back Pain, complicated by hospital stay and procedures  - now transitioned to Tramadol 100mg  nightly  -Noncancer pain should be managed by primary care physician  **Depression/anxiety:  Will discuss appropriate psychologist/psychiatrist pairing with [facility].  I have contacted Dr. Electa Sniff who is aware of the patient but we have not yet been able to connect to formulate a follow up plan.  If he is available when patient hospitalized, this would be a reasonable way to introduce his services.    Venous access: port-a-cath in place     Supportive Care Recommendations:  We recommend based on the patient???s underlying diagnosis and treatment history the following supportive care:      1. Antimicrobial prophylaxis:  ALL (in remission, receiving post-remission therapy): Bacterial: Levofloxacin 500mg  PO daily (when absolute neutrophils </= 0.5);   Fungal: Fluconazole 400mg  PO daily (when absolute neutrophils </= 0.5);   Viral: Valacyclovir 500mg  PO daily (Continuous);   PJP:  SMX/TMP DS PO BID twice weekly (Sat/Sun)  -Evusheld 04/21/2020-recommend another dose to achieve FDA recommendation update.    2. Blood product support:  I recommend the following intervals for laboratory monitoring to determine transfusion needs and monitor for hematologic effects of therapy and underlying disease: no routine monitoring outside of clinic follow-up    Leukoreduced blood products are required.  Irradiated blood products are preferred, but in case of urgent transfusion needs non-irradiated blood products may be used:     -  RBC transfusion threshold: transfuse 2 units for Hgb < 8 g/dL.  -  Platelet transfusion threshold: transfuse 1 unit of platelets for platelet count < 10, or for bleeding or need for invasive procedure.    3. Hematopoietic growth factor support: none      Care coordination: The next [facility] Follow up requested:  -Will be according to when we get of blinatumomab and ponatinib.  I requested follow-up from approximately 6 weeks from now.    These services include the following elements of medical decision making:  addressing a hematologic cancer that poses a threat to life and/or bone marrow function  interpretation of diagnostic test reports or ordering of diagnostic tests and independent interpretation of diagnostic tests  High risk of morbidity due to drug therapy requiring monitoring for toxicity or decisions regarding goals and continuation  of antineoplastic therapy            Troy Sine. [name], MD  Leukemia Program  Division of Hematology/Oncology  [facility]    Nurse Navigator (non-clinical trial patients): Colletta Maryland, RN        Tel. [phone]       Fax. [phone]  Toll-free appointments: [phone]  Scheduling assistance: [phone]  After hours/weekends: [phone] (ask for adult hematology/oncology on-call)        History of Present Illness:  We had the pleasure of seeing Danney Bungert in the Leukemia Clinic at the [place] [facility] [place] on 06/17/2020.  He is a 40 y.o. male with Ph+ B-ALL.          His oncologic history is as follows:      Oncology History Overview Note   Referring/Local Oncologist: None    Diagnosis:Ph+ ALL    Genetics:    Karyotype/FISH:Abnormal Karyotype: 46,XY,t(9;22)(q34;q11.2)[1]/45,XY,der(7;9)(q10;q10)t(9;22)(q34;q11.2),der(22)t(9;22)[11]/46,sdl,+der(22)t(9;22)[5]/46,XY[3]     Abnormal FISH: A BCR/ABL1 interphase FISH assay shows an abnormal signal pattern in 97% of the 100 cells scored. 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  The findings support a diagnosis of leukemia and have implications for targeted therapy and for monitoring residual disease.      Molecular Genetics:  BCR-ABL1 p210 transcripts were detected at a level of 46.479 IS% ratio in bone marrow.  BCR-ABL1 p190 transcripts were detected at a level of 4 in 100,000 cells in bone marrow.    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)     01/24/2020 - 04/02/2020 Chemotherapy    IP/OP LEUKEMIA GRAAPH-2005 + RITUXIMAB < 60 YO  rituximab hypercvad (odd and even course)     02/24/2020 Remission    CR with PCR MRD+; marrow showing 70% blasts with <1% blasts, negative flow MRD, BCR-ABL p210 PCR 0.197%; CNS negative for blasts     04/16/2020 Biopsy    BM Bx with 40-50% cellularity, <1% blasts, MRD flow cytometry negative, BCR-ABL p210 transcripts 0.036%     05/28/2020 - 05/28/2020 Chemotherapy    OP AML - CNS THERAPY (INTRATHECAL CYTARABINE, INTRATHECAL METHOTREXATE, OR INTRATHECAL TRIPLE)  Select one of the following: cytarabine IT 100 mg with hydrocortisone 50 mg, cytarabine IT 40 mg with methotrexate 15 mg with hydrocortisone 50 mg, OR methotrexate IT 12 mg with hydrocortisone 50 mg         Interim History:  Since his last visit he saw Dr Caryn Section with dermatology who felt that rash is DRESS, from uncertain medication. He has had prednisone and the intensity of rash is less, but still intensely pruritic.  Days are better than nights.  He has night sweats and irritation of rash.  He is scratching so hard at night that he has had some bleeding.  He does not like like applying topical medications.      continues to work with physical therapy on strength and conditioning.  He is still not back to previous levels of activity.  His port site is healed.  He has developed some episodes where he has to suddenly take a deep breath though no further episodes of orthopnea or PND.  He continues  to have ankle edema bilaterally.      Past Medical, Surgical and Family History were reviewed and pertinent updates were made in the Electronic Medical Record      ECOG Performance Status: 2    Medications:    Current Outpatient Medications   Medication Sig Dispense Refill   ??? acetaminophen (TYLENOL) 325 MG tablet Take 650 mg by mouth every six (6) hours as needed for pain.      ??? amLODIPine (NORVASC) 10 MG tablet Take 1 tablet (10 mg total) by mouth daily. 30 tablet 0   ??? carvediloL (COREG) 12.5 MG tablet Take 1 tablet (12.5 mg total) by mouth Two (2) times a day. 60 tablet 3   ??? fluticasone propionate (FLONASE) 50 mcg/actuation nasal spray 1 spray into each nostril Two (2) times a day. 16 g 0   ??? HYDROmorphone (DILAUDID) 2 MG tablet Take 1 tablet by mouth nightly.     ??? hydrOXYzine (ATARAX) 25 MG tablet Take 1 tablet (25 mg total) by mouth every six (6) hours as needed. 60 tablet 1   ??? LORazepam (ATIVAN) 0.5 MG tablet Take 1 tablet (0.5 mg total) by mouth daily. 30 tablet 0   ??? mirtazapine (REMERON) 15 MG tablet Take 1 tablet (15 mg total) by mouth nightly. 30 tablet 3   ??? ondansetron (ZOFRAN) 8 MG tablet Take 1 tablet (8 mg total) by mouth every eight (8) hours as needed for nausea. 60 tablet 2   ??? predniSONE (DELTASONE) 10 MG tablet Take 6 tablets in the AM by mouth for 1 week, then taper by 10 mg each week until taper is complete 160 tablet 0   ??? prochlorperazine (COMPAZINE) 10 MG tablet Take 1 tablet (10 mg total) by mouth every six (6) hours as needed for nausea (if no relief from zofran (ondansetron)). 60 tablet 1   ??? sodium chloride (OCEAN) 0.65 % nasal spray 1 spray into each nostril Three (3) times a day. 44 mL 0   ??? sulfamethoxazole-trimethoprim (BACTRIM DS) 800-160 mg per tablet Take 1 tablet (160 mg of trimethoprim total) by mouth 2 times a day on Saturday, Sunday. For prophylaxis while on chemo. 48 tablet 3   ??? valACYclovir (VALTREX) 500 MG tablet Take 1 tablet (500 mg total) by mouth daily. 90 tablet 3   ??? hydrocortisone 2.5 % cream Apply 1 application topically Two (2) times a day. To face under wet wraps as directed (Patient not taking: Reported on 06/17/2020) 30 g 0   ??? PONATinib (ICLUSIG) 30 mg tablet Take 1 tablet (30 mg total) by mouth daily. Swallow tablets whole. Do not crush, break, cut or chew tablets. (Patient not taking: Reported on 06/17/2020) 30 tablet 5   ??? triamcinolone (KENALOG) 0.1 % cream Apply topically Two (2) times a day. (Patient not taking: Reported on 06/17/2020) 28.4 g 1   ??? triamcinolone (KENALOG) 0.1 % cream Apply topically Two (2) times a day. To rash under wet wraps as directed (Patient not taking: Reported on 06/17/2020) 454 g 1     No current facility-administered medications for this visit.       Vital Signs:  There were no vitals filed for this visit.      Relevant Physical Exam Findings:  Did not visualize rash on screen    Relevant Laboratory, radiology and pathology results:  I personally viewed the most recent internal records, laboratory results, hematopathology report and molecular pathology reports  and discussed the available results with the patient and  family  A summary of results follows:  Appointment on 06/16/2020   Component Date Value Ref Range Status ??? HHV6 PCR, Blood 06/16/2020 Negative  Negative Final   ??? Sodium 06/16/2020 140  135 - 145 mmol/L Final   ??? Potassium 06/16/2020 3.1 (A) 3.4 - 4.8 mmol/L Final   ??? Chloride 06/16/2020 107  98 - 107 mmol/L Final   ??? Anion Gap 06/16/2020 9  5 - 14 mmol/L Final   ??? CO2 06/16/2020 24.0  20.0 - 31.0 mmol/L Final   ??? BUN 06/16/2020 21  9 - 23 mg/dL Final   ??? Creatinine 06/16/2020 2.19 (A) 0.60 - 1.10 mg/dL Final   ??? BUN/Creatinine Ratio 06/16/2020 10   Final   ??? EGFR CKD-EPI Non-African American,* 06/16/2020 36 (A) >=60 mL/min/1.52m2 Final   ??? EGFR CKD-EPI African American, Male 06/16/2020 42 (A) >=60 mL/min/1.11m2 Final   ??? Glucose 06/16/2020 111  70 - 179 mg/dL Final   ??? Calcium 16/01/9603 8.9  8.7 - 10.4 mg/dL Final   ??? Albumin 54/12/8117 3.5  3.4 - 5.0 g/dL Final   ??? Total Protein 06/16/2020 6.2  5.7 - 8.2 g/dL Final   ??? Total Bilirubin 06/16/2020 0.3  0.3 - 1.2 mg/dL Final   ??? AST 14/78/2956 17  <=34 U/L Final   ??? ALT 06/16/2020 20  10 - 49 U/L Final   ??? Alkaline Phosphatase 06/16/2020 72  46 - 116 U/L Final   ??? WBC 06/16/2020 7.0  3.6 - 11.2 10*9/L Final   ??? RBC 06/16/2020 2.54 (A) 4.26 - 5.60 10*12/L Final   ??? HGB 06/16/2020 8.7 (A) 12.9 - 16.5 g/dL Final   ??? HCT 21/30/8657 24.0 (A) 39.0 - 48.0 % Final   ??? MCV 06/16/2020 94.3  77.6 - 95.7 fL Final   ??? MCH 06/16/2020 34.2 (A) 25.9 - 32.4 pg Final   ??? MCHC 06/16/2020 36.3 (A) 32.0 - 36.0 g/dL Final   ??? RDW 84/69/6295 17.1 (A) 12.2 - 15.2 % Final   ??? MPV 06/16/2020 7.5  6.8 - 10.7 fL Final   ??? Platelet 06/16/2020 301  150 - 450 10*9/L Final   ??? nRBC 06/16/2020 3  <=4 /100 WBCs Final   ??? Neutrophils % 06/16/2020 67.0  % Final   ??? Lymphocytes % 06/16/2020 22.4  % Final   ??? Monocytes % 06/16/2020 8.8  % Final   ??? Eosinophils % 06/16/2020 0.9  % Final   ??? Basophils % 06/16/2020 0.9  % Final   ??? Absolute Neutrophils 06/16/2020 4.7  1.8 - 7.8 10*9/L Final   ??? Absolute Lymphocytes 06/16/2020 1.6  1.1 - 3.6 10*9/L Final   ??? Absolute Monocytes 06/16/2020 0.6  0.3 - 0.8 10*9/L Final   ??? Absolute Eosinophils 06/16/2020 0.1  0.0 - 0.5 10*9/L Final   ??? Absolute Basophils 06/16/2020 0.1  0.0 - 0.1 10*9/L Final   ??? Anisocytosis 06/16/2020 Slight (A) Not Present Final   ??? Smear Review Comments 06/16/2020 See Comment (A) Undefined Final    [id number]  Slide reviewed. Promyelocytes present. Myelocytes present.     ??? Polychromasia 06/16/2020 Slight (A) Not Present Final   ??? Dohle Bodies 06/16/2020 Present (A) Not Present Final   ??? Toxic Granulation 06/16/2020 Present (A) Not Present Final   ??? Neutrophil Left Shift 06/16/2020 Present (A) Not Present Final   ??? Pathologist Smear Interpretation 06/16/2020 Confirmed by Hemepath Specialist/Senior Tech  Confirmed by Hemepath Fellow, Confirmed by Hemepath Specialist/Senior Tech, Confirmed by Core Specialist/Senior Tech, To  Be Accessioned - Case Report to Follow Final   Lab on 06/12/2020   Component Date Value Ref Range Status   ??? Blood Type 06/12/2020 O POS   Final   ??? Select Screen 4 06/12/2020 NEG   Final    Patient has a history of red cell antibodies   ??? LDH 06/12/2020 370 (A) 120 - 246 U/L Final   ??? Uric Acid 06/12/2020 7.7  3.7 - 9.2 mg/dL Final   ??? Sodium 09/60/4540 143  135 - 145 mmol/L Final   ??? Potassium 06/12/2020 3.2 (A) 3.4 - 4.8 mmol/L Final   ??? Chloride 06/12/2020 110 (A) 98 - 107 mmol/L Final   ??? Anion Gap 06/12/2020 11  5 - 14 mmol/L Final   ??? CO2 06/12/2020 22.0  20.0 - 31.0 mmol/L Final   ??? BUN 06/12/2020 21  9 - 23 mg/dL Final   ??? Creatinine 06/12/2020 2.35 (A) 0.60 - 1.10 mg/dL Final   ??? BUN/Creatinine Ratio 06/12/2020 9   Final   ??? EGFR CKD-EPI Non-African American,* 06/12/2020 33 (A) >=60 mL/min/1.60m2 Final   ??? EGFR CKD-EPI African American, Male 06/12/2020 39 (A) >=60 mL/min/1.26m2 Final   ??? Glucose 06/12/2020 79  70 - 179 mg/dL Final   ??? Calcium 98/02/9146 9.3  8.7 - 10.4 mg/dL Final   ??? Albumin 82/95/6213 4.1  3.4 - 5.0 g/dL Final   ??? Total Protein 06/12/2020 6.8  5.7 - 8.2 g/dL Final   ??? Total Bilirubin 06/12/2020 0.4  0.3 - 1.2 mg/dL Final   ??? AST 08/65/7846 17  <=34 U/L Final   ??? ALT 06/12/2020 22  10 - 49 U/L Final   ??? Alkaline Phosphatase 06/12/2020 91  46 - 116 U/L Final   ??? Sodium 06/12/2020 142  135 - 145 mmol/L Final   ??? Potassium 06/12/2020 3.2 (A) 3.4 - 4.8 mmol/L Final   ??? Chloride 06/12/2020 108 (A) 98 - 107 mmol/L Final   ??? Anion Gap 06/12/2020 11  5 - 14 mmol/L Final   ??? CO2 06/12/2020 23.0  20.0 - 31.0 mmol/L Final   ??? BUN 06/12/2020 21  9 - 23 mg/dL Final   ??? Creatinine 06/12/2020 2.34 (A) 0.60 - 1.10 mg/dL Final   ??? BUN/Creatinine Ratio 06/12/2020 9   Final   ??? EGFR CKD-EPI Non-African American,* 06/12/2020 34 (A) >=60 mL/min/1.10m2 Final   ??? EGFR CKD-EPI African American, Male 06/12/2020 39 (A) >=60 mL/min/1.21m2 Final   ??? Glucose 06/12/2020 79  70 - 179 mg/dL Final   ??? Calcium 96/29/5284 9.2  8.7 - 10.4 mg/dL Final   ??? Albumin 13/24/4010 4.0  3.4 - 5.0 g/dL Final   ??? Total Protein 06/12/2020 6.8  5.7 - 8.2 g/dL Final   ??? Total Bilirubin 06/12/2020 0.4  0.3 - 1.2 mg/dL Final   ??? AST 27/25/3664 18  <=34 U/L Final   ??? ALT 06/12/2020 20  10 - 49 U/L Final   ??? Alkaline Phosphatase 06/12/2020 95  46 - 116 U/L Final   ??? Sodium 06/12/2020 143  135 - 145 mmol/L Final   ??? Potassium 06/12/2020 3.2 (A) 3.4 - 4.8 mmol/L Final   ??? Chloride 06/12/2020 110 (A) 98 - 107 mmol/L Final   ??? Anion Gap 06/12/2020 11  5 - 14 mmol/L Final   ??? CO2 06/12/2020 22.0  20.0 - 31.0 mmol/L Final   ??? BUN 06/12/2020 21  9 - 23 mg/dL Final   ??? Creatinine 06/12/2020 2.37 (A) 0.60 - 1.10  mg/dL Final   ??? BUN/Creatinine Ratio 06/12/2020 9   Final   ??? EGFR CKD-EPI Non-African American,* 06/12/2020 33 (A) >=60 mL/min/1.52m2 Final   ??? EGFR CKD-EPI African American, Male 06/12/2020 38 (A) >=60 mL/min/1.21m2 Final   ??? Glucose 06/12/2020 79  70 - 179 mg/dL Final   ??? Calcium 16/01/9603 9.4  8.7 - 10.4 mg/dL Final   ??? Albumin 54/12/8117 4.1  3.4 - 5.0 g/dL Final   ??? Total Protein 06/12/2020 6.9  5.7 - 8.2 g/dL Final   ??? Total Bilirubin 06/12/2020 0.4  0.3 - 1.2 mg/dL Final   ??? AST 14/78/2956 17  <=34 U/L Final   ??? ALT 06/12/2020 16  10 - 49 U/L Final   ??? Alkaline Phosphatase 06/12/2020 92  46 - 116 U/L Final   ??? Results Verified by Slide Scan 06/12/2020 Slide Reviewed   Final   ??? WBC 06/12/2020 4.3  3.6 - 11.2 10*9/L Final   ??? RBC 06/12/2020 2.84 (A) 4.26 - 5.60 10*12/L Final   ??? HGB 06/12/2020 9.0 (A) 12.9 - 16.5 g/dL Final   ??? HCT 21/30/8657 25.4 (A) 39.0 - 48.0 % Final   ??? MCV 06/12/2020 89.5  77.6 - 95.7 fL Final   ??? MCH 06/12/2020 31.8  25.9 - 32.4 pg Final   ??? MCHC 06/12/2020 35.5  32.0 - 36.0 g/dL Final   ??? RDW 84/69/6295 15.4 (A) 12.2 - 15.2 % Final   ??? MPV 06/12/2020 7.9  6.8 - 10.7 fL Final   ??? Platelet 06/12/2020 166  150 - 450 10*9/L Final   ??? nRBC 06/12/2020 1  <=4 /100 WBCs Final   ??? Neutrophils % 06/12/2020 23.3  % Final   ??? Lymphocytes % 06/12/2020 31.4  % Final   ??? Monocytes % 06/12/2020 10.7  % Final   ??? Eosinophils % 06/12/2020 34.0  % Final   ??? Basophils % 06/12/2020 0.6  % Final   ??? Absolute Neutrophils 06/12/2020 1.0 (A) 1.8 - 7.8 10*9/L Final   ??? Absolute Lymphocytes 06/12/2020 1.3  1.1 - 3.6 10*9/L Final   ??? Absolute Monocytes 06/12/2020 0.5  0.3 - 0.8 10*9/L Final   ??? Absolute Eosinophils 06/12/2020 1.5 (A) 0.0 - 0.5 10*9/L Final   ??? Absolute Basophils 06/12/2020 0.0  0.0 - 0.1 10*9/L Final   ??? WBC 06/12/2020 4.3  3.6 - 11.2 10*9/L Final   ??? RBC 06/12/2020 2.86 (A) 4.26 - 5.60 10*12/L Final   ??? HGB 06/12/2020 9.2 (A) 12.9 - 16.5 g/dL Final   ??? HCT 28/41/3244 25.6 (A) 39.0 - 48.0 % Final   ??? MCV 06/12/2020 89.7  77.6 - 95.7 fL Final   ??? MCH 06/12/2020 32.1  25.9 - 32.4 pg Final   ??? MCHC 06/12/2020 35.7  32.0 - 36.0 g/dL Final   ??? RDW 04/06/7251 15.4 (A) 12.2 - 15.2 % Final   ??? MPV 06/12/2020 7.8  6.8 - 10.7 fL Final   ??? Platelet 06/12/2020 161  150 - 450 10*9/L Final   ??? Neutrophils % 06/12/2020 22.6  % Final   ??? Lymphocytes % 06/12/2020 32.3  % Final   ??? Monocytes % 06/12/2020 10.2  % Final   ??? Eosinophils % 06/12/2020 34.3  % Final   ??? Basophils % 06/12/2020 0.6  % Final   ??? Absolute Neutrophils 06/12/2020 1.0 (A) 1.8 - 7.8 10*9/L Final   ??? Absolute Lymphocytes 06/12/2020 1.4  1.1 - 3.6 10*9/L Final   ??? Absolute Monocytes 06/12/2020 0.4  0.3 - 0.8 10*9/L Final   ??? Absolute Eosinophils 06/12/2020 1.5 (A) 0.0 - 0.5 10*9/L Final   ??? Absolute Basophils 06/12/2020 0.0  0.0 - 0.1 10*9/L Final   ??? Smear Review Comments 06/12/2020 See Comment (A) Undefined Final    Slide Reviewed  Myelocytes present - rare.  Reactive lymphocytes present.  Irregularly contracted RBCs present.     ??? Schistocytes 06/12/2020 Rare (A) Not Present Final   Office Visit on 06/12/2020   Component Date Value Ref Range Status   ??? Case Report 06/12/2020    Final                    Value:Surgical Pathology Report                         Case: [id number]                                 Authorizing Provider:  Danton Clap, MD          Collected:           06/12/2020 1525              Ordering Location:     [facility]  Received:            06/15/2020 0981                                     [place]                                                                  Pathologist:           Delmar Landau, MD                                                        Specimen:    Skin, Left arm, punch                                                                     ??? Diagnosis 06/12/2020    Final                    Value:This result contains rich text formatting which cannot be displayed here.   ??? Diagnosis Comment 06/12/2020    Final                    Value:This result contains rich text formatting which cannot be displayed here.   ??? Clinical History 06/12/2020    Final  Value:This result contains rich text formatting which cannot be displayed here.   ??? Gross Description 06/12/2020    Final                    Value:This result contains rich text formatting which cannot be displayed here.   ??? Microscopic Description 06/12/2020    Final Value:This result contains rich text formatting which cannot be displayed here.     Chest X-ray PA and lateral ??Increased interstitial edema versus atypical infection/inflammation.  ??  Small right pleural effusion.    Final Diagnosis   Date Value Ref Range Status   05/28/2020   Final    A:  Cerebrospinal fluid, cytospin review and flow cytometry   -  No blasts identified    This electronic signature is attestation that the pathologist personally reviewed the submitted material(s) and the final diagnosis reflects that evaluation.       Final Diagnosis   Date Value Ref Range Status   05/28/2020   Final    A:  Cerebrospinal fluid, cytospin review and flow cytometry   -  No blasts identified    This electronic signature is attestation that the pathologist personally reviewed the submitted material(s) and the final diagnosis reflects that evaluation.     04/16/2020   Final    Bone marrow, right iliac, aspiration and biopsy  -  Normoocellular bone marrow (40-50%) with trilineage hematopoiesis and <1% blasts by manual aspirate differential  -   Flow cytometry MRD analysis shows no definitive immunophenotypic evidence of residual B-lymphoblastic leukemia/lymphoma.      -  BCR-ABL1 (p210) RT-PCR studies were positive at a level of 0.036 IS % ratio; see the linked report [id number] for details.  -  See linked reports for associated Ancillary Studies.      Peripheral blood, smear review  -  Leukocytosis with left-shifted neutrophilia, including a rare blast  -  Normocytic anemia  -  Slight thrombocytosis    This electronic signature is attestation that the pathologist personally reviewed the submitted material(s) and the final diagnosis reflects that evaluation.     03/13/2020   Final    A:  Cerebrospinal fluid, cytospin review   -   No blasts identified    This electronic signature is attestation that the pathologist personally reviewed the submitted material(s) and the final diagnosis reflects that evaluation.

## 2020-06-17 ENCOUNTER — Telehealth: Admit: 2020-06-17 | Discharge: 2020-06-18 | Payer: MEDICAID | Attending: Internal Medicine | Primary: Internal Medicine

## 2020-06-17 DIAGNOSIS — C9100 Acute lymphoblastic leukemia not having achieved remission: Principal | ICD-10-CM

## 2020-06-17 NOTE — Unmapped (Signed)
Reviewed Meds and Allergies, confirmed pharmacy and informed how to use Doximity.

## 2020-06-18 NOTE — Unmapped (Signed)
Hi,     Lasaunth with [employer] contacted the Communication Center regarding the following:    - Called reporting to Dr [name] that this patient receiving their medication from [facility].    Please contact Lasaunth at [phone].    Thanks in advance,    Christell Faith  [facility]   [phone]

## 2020-06-19 ENCOUNTER — Ambulatory Visit: Admit: 2020-06-19 | Discharge: 2020-06-27 | Disposition: A | Discharge status: Home / Self Care | Payer: MEDICAID

## 2020-06-19 ENCOUNTER — Other Ambulatory Visit: Admit: 2020-06-19 | Discharge: 2020-06-27 | Disposition: A | Discharge status: Home / Self Care | Payer: MEDICAID

## 2020-06-19 DIAGNOSIS — C9100 Acute lymphoblastic leukemia not having achieved remission: Principal | ICD-10-CM

## 2020-06-19 DIAGNOSIS — R21 Rash and other nonspecific skin eruption: Principal | ICD-10-CM

## 2020-06-19 LAB — BASIC METABOLIC PANEL
ANION GAP: 12 mmol/L (ref 5–14)
BLOOD UREA NITROGEN: 28 mg/dL — ABNORMAL HIGH (ref 9–23)
BUN / CREAT RATIO: 14
CALCIUM: 8.2 mg/dL — ABNORMAL LOW (ref 8.7–10.4)
CHLORIDE: 105 mmol/L (ref 98–107)
CO2: 22 mmol/L (ref 20.0–31.0)
CREATININE: 2.02 mg/dL — ABNORMAL HIGH
EGFR CKD-EPI AA MALE: 46 mL/min/{1.73_m2} — ABNORMAL LOW (ref >=60)
EGFR CKD-EPI NON-AA MALE: 40 mL/min/{1.73_m2} — ABNORMAL LOW (ref >=60)
GLUCOSE RANDOM: 96 mg/dL (ref 70–179)
POTASSIUM: 3.4 mmol/L (ref 3.4–4.8)
SODIUM: 139 mmol/L (ref 135–145)

## 2020-06-19 LAB — CBC W/ AUTO DIFF
BASOPHILS ABSOLUTE COUNT: 0.1 10*9/L (ref 0.0–0.1)
BASOPHILS RELATIVE PERCENT: 0.8 %
EOSINOPHILS ABSOLUTE COUNT: 0 10*9/L (ref 0.0–0.5)
EOSINOPHILS RELATIVE PERCENT: 0.3 %
HEMATOCRIT: 23.3 % — ABNORMAL LOW (ref 39.0–48.0)
HEMOGLOBIN: 8.2 g/dL — ABNORMAL LOW (ref 12.9–16.5)
LYMPHOCYTES ABSOLUTE COUNT: 2.3 10*9/L (ref 1.1–3.6)
LYMPHOCYTES RELATIVE PERCENT: 13.3 %
MEAN CORPUSCULAR HEMOGLOBIN CONC: 35.2 g/dL (ref 32.0–36.0)
MEAN CORPUSCULAR HEMOGLOBIN: 32.4 pg (ref 25.9–32.4)
MEAN CORPUSCULAR VOLUME: 92.1 fL (ref 77.6–95.7)
MEAN PLATELET VOLUME: 7.2 fL (ref 6.8–10.7)
MONOCYTES ABSOLUTE COUNT: 1.7 10*9/L — ABNORMAL HIGH (ref 0.3–0.8)
MONOCYTES RELATIVE PERCENT: 10.2 %
NEUTROPHILS ABSOLUTE COUNT: 12.7 10*9/L — ABNORMAL HIGH (ref 1.8–7.8)
NEUTROPHILS RELATIVE PERCENT: 75.4 %
PLATELET COUNT: 294 10*9/L (ref 150–450)
RED BLOOD CELL COUNT: 2.53 10*12/L — ABNORMAL LOW (ref 4.26–5.60)
RED CELL DISTRIBUTION WIDTH: 18.9 % — ABNORMAL HIGH (ref 12.2–15.2)
WBC ADJUSTED: 16.9 10*9/L — ABNORMAL HIGH (ref 3.6–11.2)

## 2020-06-19 LAB — HEPATIC FUNCTION PANEL
ALBUMIN: 3.6 g/dL (ref 3.4–5.0)
ALKALINE PHOSPHATASE: 81 U/L (ref 46–116)
ALT (SGPT): 16 U/L (ref 10–49)
AST (SGOT): 19 U/L (ref <=34)
BILIRUBIN DIRECT: <0.1 mg/dL (ref 0.00–0.30)
BILIRUBIN TOTAL: 0.3 mg/dL (ref 0.3–1.2)
PROTEIN TOTAL: 6.2 g/dL (ref 5.7–8.2)

## 2020-06-19 LAB — SLIDE REVIEW

## 2020-06-19 LAB — PHOSPHORUS: PHOSPHORUS: 4.7 mg/dL (ref 2.4–5.1)

## 2020-06-19 LAB — C-REACTIVE PROTEIN: C-REACTIVE PROTEIN: <4 mg/L (ref <=10.0)

## 2020-06-19 LAB — MAGNESIUM: MAGNESIUM: 1.2 mg/dL — ABNORMAL LOW (ref 1.6–2.6)

## 2020-06-19 MED ORDER — HYDROXYZINE HCL 25 MG TABLET
ORAL_TABLET | Freq: Four times a day (QID) | ORAL | 1 refills | 90.00000 days | Status: SS | PRN
Start: 2020-06-19 — End: 2020-07-19

## 2020-06-19 MED ADMIN — magnesium sulfate 2gm/50mL IVPB: 2 g | INTRAVENOUS | @ 18:00:00 | Stop: 2020-06-19

## 2020-06-19 NOTE — Unmapped (Unsigned)
Port accessed and labs drawn with no complications by [place] [facility].  Port flushed with saline.

## 2020-06-19 NOTE — Unmapped (Addendum)
Adam 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 Developed worsening skin pain, torso erythema and temporal edema for which drug was stopped. Restarted and then developed chest pain, again drug stopped. Patient was pre-medicated again and started on 9 mcg x 3 days an then escalated to 28 mcg x 2 days. He tolerated this well. Remained on Ponatinib and was started on ASA. He continued to complain of burning skin pain and itching and started on Oxycontin and up titrated Gabapentin. Dermatology consulted and do not feel symptoms are related to DRESS and will continue Prednisone taper as previously prescribed. Palliative care was consulted. Outpatient referral placed for palliative care follow up. Significant anxiety throughout admission. Increased Klonopin to 1 mg BID and given on going itching changed Remeron to Nortriptyline. On morning discharge day, patient more somnolent, therefore klonopin decreased to 0.5mg  BID with BID PRN available and dilaudid interval increased to q8h PRN. Patient's wife is to keep medications in locked cabinet and administer to patient appropriately.  In afternoon, patient more alert, awake with ICANS of 10. Patient appropriate for discharge. Referral placed for outpatient CCSP. Follow up scheduled as below. 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 The course was complicated by septic shock, candida krusei fungemia, MRSA bacteremia with septic emboli c/b acute renal failure and respiratory distress requiring dialysis and intubation.  Post-induction bmbx (day 29) demonstrated MRD-negative remission but low-level BCR-ABL persisted. Ultimately due to CKD, performance status, COVID infection and significant complications noted above cycle 2 of HyperCVAD was delayed and ultimately differed in favor of Blinatumomab, cycle 1D1=3/21. Will continue ppx Valtrex and Bactrim as well as Ponatinib and ASA ppx.     Primary Oncologist: [name]  Chemotherapy: Blinatumomab (for MRD)  Cycle: 1 D1 = 3/18@ 2100, reaction x 2, plan as below started on 3/21  - Blinatumomab 9 mcg/day x 3 days (as inpatient)   - Blinatumomab 28 mcg/day x 2 days (as inpatient), then remainder of 28 day cycle outpatient.     Disposition   - Infusion appt on 3/26 at 12 for 48 hour bag change   - Infusion appt on 3/28 at 12 for 7 day bag change then weekly   - Scheduled bag disconnection - scheduled for 4/18  - BMBx - scheduled on 4/21  - Follow up with [name] on 4/27  - Palliative care and CCSP to arrange for follow-up to assist in pain medication management.   - Follow-up for interim APP f/u clinic requested on day of discharge.     AKI to CKD: Initial insult secondary to sepsis. Now s/p dialysis with CKD and stable Cr. Dose medicines for creatinine clearance of approximately 30-35.      DRESS:  Followed by Dr. Caryn Section with [facility]. S/p punch biopsy on 3/11. He will continue prednisone taper (see below) and PRN hydroxyzine. Timing of onset occurred proximate to time when dose of dasatinib was being adjusted, posaconazole given and sotrovimab. Sutures removed by patient himself on 3/25.   - Prednisone taper                    40mg  daily x 7 (3/25-3/31)              30mg  dalily x 7 (4/1- 4/7)               20mg  daily x  7 (4/8-4/14)               10 mg daily x 7 (4/15-4/21)    Skin pain/itching: Present since COVID diagnosis and potentially worsened by Blinatumomab. Will discharge with OxyContin 30 mg BID and Dilaudid 2 mg PO PRN (interval increased from q4h to q8h PRN on day of discharge) as well as Gabapentin 300 mg TID and Atarax PRN. Palliative care consulted and will follow as an outpatient. CCSP referral placed as well..      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 Patient reports hypersensitivity of left hearing and difficulty with complex sounds and multiple conversations  - AVOID Lasix/loop diuretics, other ototoxic medications     HTN: Chronic condition since diagnosis. Continue home regimen includes Amlodipine and Coreg.       Anxiety/Depression: Chronic condition. Was previously on scheduled Ativan. No longer taking. Admitted on Remoron 15 mg QHS. Anxiety worsened significantly since Blinatumomab initiation. Psychiatry consulted. Discontinued Remeron and will discharge on Nortriptyline 10 mg x 7 day followed by 25 mg nightly. Will continue Klonopin 1 mg BID with PRN available, decreased to 0.5mg  BID and BID PRN on day of discharge. Psychiatry to arrange follow up.     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 ???Close your eyes and stick out your tongue.??? 1/1 [employer] Ability to write a standard sentence (eg ???Our national bird is the Human resources officer??? 1/1 Point   Attention Ability to count backwards from 100 by 10 1/1 Point      Total 10/10 points

## 2020-06-20 LAB — COMPREHENSIVE METABOLIC PANEL
ALBUMIN: 3.4 g/dL (ref 3.4–5.0)
ALKALINE PHOSPHATASE: 82 U/L (ref 46–116)
ALT (SGPT): 15 U/L (ref 10–49)
ANION GAP: 11 mmol/L (ref 5–14)
AST (SGOT): 16 U/L (ref <=34)
BILIRUBIN TOTAL: 0.3 mg/dL (ref 0.3–1.2)
BLOOD UREA NITROGEN: 31 mg/dL — ABNORMAL HIGH (ref 9–23)
BUN / CREAT RATIO: 14
CALCIUM: 8.7 mg/dL (ref 8.7–10.4)
CHLORIDE: 108 mmol/L — ABNORMAL HIGH (ref 98–107)
CO2: 21 mmol/L (ref 20.0–31.0)
CREATININE: 2.18 mg/dL — ABNORMAL HIGH
EGFR CKD-EPI AA MALE: 42 mL/min/{1.73_m2} — ABNORMAL LOW (ref >=60)
EGFR CKD-EPI NON-AA MALE: 37 mL/min/{1.73_m2} — ABNORMAL LOW (ref >=60)
GLUCOSE RANDOM: 129 mg/dL (ref 70–179)
POTASSIUM: 4.3 mmol/L (ref 3.4–4.8)
PROTEIN TOTAL: 6 g/dL (ref 5.7–8.2)
SODIUM: 140 mmol/L (ref 135–145)

## 2020-06-20 LAB — CBC W/ AUTO DIFF
BASOPHILS ABSOLUTE COUNT: 0.1 10*9/L (ref 0.0–0.1)
BASOPHILS RELATIVE PERCENT: 0.5 %
EOSINOPHILS ABSOLUTE COUNT: 0 10*9/L (ref 0.0–0.5)
EOSINOPHILS RELATIVE PERCENT: 0 %
HEMATOCRIT: 25.8 % — ABNORMAL LOW (ref 39.0–48.0)
HEMOGLOBIN: 8.9 g/dL — ABNORMAL LOW (ref 12.9–16.5)
LYMPHOCYTES ABSOLUTE COUNT: 0.2 10*9/L — ABNORMAL LOW (ref 1.1–3.6)
LYMPHOCYTES RELATIVE PERCENT: 1.1 %
MEAN CORPUSCULAR HEMOGLOBIN CONC: 34.5 g/dL (ref 32.0–36.0)
MEAN CORPUSCULAR HEMOGLOBIN: 32.3 pg (ref 25.9–32.4)
MEAN CORPUSCULAR VOLUME: 93.5 fL (ref 77.6–95.7)
MEAN PLATELET VOLUME: 7.2 fL (ref 6.8–10.7)
MONOCYTES ABSOLUTE COUNT: 0.6 10*9/L (ref 0.3–0.8)
MONOCYTES RELATIVE PERCENT: 3.8 %
NEUTROPHILS ABSOLUTE COUNT: 14.7 10*9/L — ABNORMAL HIGH (ref 1.8–7.8)
NEUTROPHILS RELATIVE PERCENT: 94.6 %
PLATELET COUNT: 248 10*9/L (ref 150–450)
RED BLOOD CELL COUNT: 2.76 10*12/L — ABNORMAL LOW (ref 4.26–5.60)
RED CELL DISTRIBUTION WIDTH: 18.8 % — ABNORMAL HIGH (ref 12.2–15.2)
WBC ADJUSTED: 15.5 10*9/L — ABNORMAL HIGH (ref 3.6–11.2)

## 2020-06-20 LAB — HEPATIC FUNCTION PANEL
ALBUMIN: 3.4 g/dL (ref 3.4–5.0)
ALKALINE PHOSPHATASE: 82 U/L (ref 46–116)
ALT (SGPT): 15 U/L (ref 10–49)
AST (SGOT): 16 U/L (ref <=34)
BILIRUBIN DIRECT: <0.1 mg/dL (ref 0.00–0.30)
BILIRUBIN TOTAL: 0.3 mg/dL (ref 0.3–1.2)
PROTEIN TOTAL: 6 g/dL (ref 5.7–8.2)

## 2020-06-20 LAB — BASIC METABOLIC PANEL
ANION GAP: 11 mmol/L (ref 5–14)
BLOOD UREA NITROGEN: 31 mg/dL — ABNORMAL HIGH (ref 9–23)
BUN / CREAT RATIO: 14
CALCIUM: 8.7 mg/dL (ref 8.7–10.4)
CHLORIDE: 108 mmol/L — ABNORMAL HIGH (ref 98–107)
CO2: 21 mmol/L (ref 20.0–31.0)
CREATININE: 2.18 mg/dL — ABNORMAL HIGH
EGFR CKD-EPI AA MALE: 42 mL/min/{1.73_m2} — ABNORMAL LOW (ref >=60)
EGFR CKD-EPI NON-AA MALE: 37 mL/min/{1.73_m2} — ABNORMAL LOW (ref >=60)
GLUCOSE RANDOM: 129 mg/dL (ref 70–179)
POTASSIUM: 4.3 mmol/L (ref 3.4–4.8)
SODIUM: 140 mmol/L (ref 135–145)

## 2020-06-20 LAB — PHOSPHORUS: PHOSPHORUS: 4.4 mg/dL (ref 2.4–5.1)

## 2020-06-20 LAB — MAGNESIUM: MAGNESIUM: 1.7 mg/dL (ref 1.6–2.6)

## 2020-06-20 LAB — C-REACTIVE PROTEIN: C-REACTIVE PROTEIN: <4 mg/L (ref <=10.0)

## 2020-06-20 MED ADMIN — mirtazapine (REMERON) tablet 15 mg: 15 mg | ORAL | @ 03:00:00

## 2020-06-20 MED ADMIN — sulfamethoxazole-trimethoprim (BACTRIM DS) 800-160 mg tablet 160 mg of trimethoprim: 1 | ORAL | @ 13:00:00 | Stop: 2020-07-25

## 2020-06-20 MED ADMIN — blinatumomab (BLINCYTO) 28 mcg/day in sodium chloride 0.9% NON-PVC 0.9 % IVPB (24-HR INFUSION): 28 ug/d | INTRAVENOUS | @ 03:00:00 | Stop: 2020-06-22

## 2020-06-20 MED ADMIN — PONATinib (ICLUSIG) tablet 30 mg: 30 mg | ORAL | @ 04:00:00

## 2020-06-20 MED ADMIN — predniSONE (DELTASONE) tablet 50 mg: 50 mg | ORAL | @ 13:00:00

## 2020-06-20 MED ADMIN — dexamethasone (DECADRON) 4 mg/mL injection 16 mg: 16 mg | INTRAVENOUS | @ 03:00:00 | Stop: 2020-06-19

## 2020-06-20 MED ADMIN — carvediloL (COREG) tablet 12.5 mg: 12.5 mg | ORAL | @ 13:00:00

## 2020-06-20 MED ADMIN — valACYclovir (VALTREX) tablet 500 mg: 500 mg | ORAL | @ 13:00:00

## 2020-06-20 MED ADMIN — carvediloL (COREG) tablet 12.5 mg: 12.5 mg | ORAL | @ 03:00:00

## 2020-06-20 MED ADMIN — amLODIPine (NORVASC) tablet 10 mg: 10 mg | ORAL | @ 13:00:00

## 2020-06-20 MED ADMIN — sodium chloride (NS) 0.9 % flush 10 mL: 10 mL | INTRAVENOUS | @ 03:00:00

## 2020-06-20 NOTE — Unmapped (Signed)
Heme/Onc APP Daily Progress Note        Active Problems:    * No active hospital problems. *       LOS: 1 day     Assessment/Plan: Adam Keith is an 40 y.o. 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    Plan Summary: C1D2 Blinatumomab = 3/19 PM. Patient tolerating well thus far. Continue Valtrex, Bactrim ppx. Continue Ponatinib. Continue Prednisone taper, currently at 50 mg. Continue Amlodipine and Coreg daily. Further supportive care and chronic meds as noted below.     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 The course was complicated by septic shock, candida krusei fungemia, MRSA bacteremia with septic emboli c/b acute renal failure and respiratory distress requiring dialysis and intubation. ??Post-induction bmbx (day 29) demonstrated flow-based??MRD-negative remission but low-level BCR-ABL persisted. Ultimately due to CKD, performance status, COVID infection and significant complication noted above cycle 2 of HyperCVAD was delayed and ultimately differed in favor of Blinatumomab. Last Bone marrow biopsy on 1/18 showed a normo cellular marrow with less than 1% Blasts with 0.036 is ration for BCR/ABL.    - Valtrex, Bactrim ppx   - Potnatinib 30 mg daily   ??  Primary Oncologist: [name]  Chemotherapy: Blinatumomab (for MRD)  Cycle: 1 D1 = 3/18@ 2100  - Blinatumomab 28 mcg days 1-3 (as inpatient)  - Plan to move timing of dose 4 to convenient time for home health bag changes  - Blinatumomab 28 mcg days 4-28 as an outpatient  - Dexamethasone 20 mg day 1    [ ]  Assess for neuro toxicity - ICE score daily, include handwriting  [ ]  Assess for cytokine release syndrome  ??  Toxicities  - Frequent toxicities - pyrexia (60%), headache (34%), febrile neutropenia (28%), peripheral edema (26%), nausea (24%), hypokalemia (24%), constipation (21%), anemia (20%)  - 52% neurologic toxicity (mostly grade 1-2)  ??  Disposition   - Day prior to discharge, ensure nursing has instructions for discharge process for blinatumomab   - Bag changes at [facility] infusion due 3/21@1300   - twice weekly lab checks, and CVAD dressing changes.   [ ]  F/U appointment in clinic after cycle +/- bone marrow biopsy   ??  AKI to CKD: due to sepsis. Now s/p dialysis with stable Cr appears to have developed CKD due to incompletely healed acute injury.  - Dose medicines for creatinine clearance of approximately 30-35  ??  DRESS: ??Followed by Dr. Caryn Section with [facility]. S/p punch biopsy on 3/11 and will need sutures removed 14 days after placement (3/25). He will continue prednisone taper and PRN hydroxyzine. Timing of onset occurred proximate to time when dose of dasatinib was being adjusted, posaconazole given and sotrovimab. ??Likely one of these changes was responsible for the DRESS.   [ ]  Sutures will need to be removed ~3/25   - Prednisone taper 50mg  daily X7 day (3/18-3/25) then taper by 10mg  daily each week  ??  History of Dyspnea, PND, edema, pleural effusion:??Likely due to dasatinib. Monitor after therapy change.  - No current symptoms of SOB  - CTM    ??  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 Patient reports hypersensitivity of left hearing and difficulty with complex sounds and multiple conversations  - AVOID LAsix/loop diuretics, other ototoxic medications  ??  HTN: Chronic condition since diagnosis. Home regimen includes Amlodipine and Coreg.   -  Amlodipine daily   - Coreg daily   ??  Chronic Back Pain: Complicated by hospital stay and procedures.   - Tramadol 100mg  nightly  [ ]  PT/OT consulted  ??  History of Candidemia: Per [name] and Dr [name]??they feel that the risk of recurrent fungal infection is low if we can avoid neutropenia. No antifungal ppx at this time.  ??  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 NAEON. Patient without complaints this morning. He would like to go outside but understands why he is unable to do so. Tolerating treatment well thus far.     10 point ROS otherwise negative except as above in the HPI.     Objective:     Vital signs in last 24 hours:  Temp:  [35.6 ??C (96.1 ??F)-36.7 ??C (98.1 ??F)] 36.6 ??C (97.9 ??F)  Heart Rate:  [81-91] 81  Resp:  [16-18] 16  BP: (149-176)/(78-84) 149/83  SpO2:  [98 %-100 %] 98 %  BMI (Calculated):  [29.09] 29.09    Intake/Output last 3 shifts:  No intake/output data recorded.    Meds:  Current Facility-Administered Medications   Medication Dose Route Frequency Provider Last Rate Last Admin   ??? amLODIPine (NORVASC) tablet 10 mg  10 mg Oral Daily Herbert Moors, [place]       ??? blinatumomab (BLINCYTO) 28 mcg/day in sodium chloride 0.9% NON-PVC 0.9 % IVPB (24-HR INFUSION)  28 mcg/day Intravenous over 24 hr Guerry Bruin, MD 10 mL/hr at 06/19/20 2327 32.5 mcg at 06/19/20 2327   ??? carvediloL (COREG) tablet 12.5 mg  12.5 mg Oral BID Herbert Moors, AGNP   12.5 mg at 06/19/20 2326   ??? diphenhydrAMINE (BENADRYL) injection 25 mg  25 mg Intravenous Q4H PRN Guerry Bruin, MD       ??? EPINEPHrine [facility]) injection 0.3 mg  0.3 mg Intramuscular Daily PRN Guerry Bruin, MD       ??? famotidine (PF) (PEPCID) injection 20 mg  20 mg Intravenous Q4H PRN Guerry Bruin, MD       ??? hydrOXYzine (ATARAX) tablet 25 mg  25 mg Oral Q6H PRN Herbert Moors, AGNP       ??? IP OKAY TO TREAT   Other Continuous PRN Herbert Moors, AGNP       ??? loperamide (IMODIUM) capsule 2 mg  2 mg Oral Q2H PRN Herbert Moors, AGNP       ??? loperamide (IMODIUM) capsule 4 mg  4 mg Oral Once PRN Herbert Moors, AGNP       ??? meperidine (DEMEROL) injection 25 mg  25 mg Intravenous Q30 Min PRN Guerry Bruin, MD       ??? methylPREDNISolone sodium succinate (PF) (Solu-MEDROL) injection 125 mg  125 mg Intravenous Q4H PRN Guerry Bruin, MD       ??? mirtazapine (REMERON) tablet 15 mg  15 mg Oral Nightly Herbert Moors, AGNP   15 mg at 06/19/20 2326   ??? PONATinib (ICLUSIG) tablet 30 mg **PATIENT SUPPLIED**  30 mg Oral Q24H Dionicia Abler  [name], MD   30 mg at 06/19/20 2335   ??? predniSONE (DELTASONE) tablet 50 mg  50 mg Oral Daily Herbert Moors, [place]       ??? prochlorperazine (COMPAZINE) injection 10 mg  10 mg Intravenous Q6H PRN Guerry Bruin, MD       ??? prochlorperazine (COMPAZINE) tablet 10 mg  10 mg Oral Q6H PRN Guerry Bruin, MD       ??? sodium chloride (NS) 0.9 % flush 10 mL  10 mL Intravenous BID Herbert Moors, AGNP       ??? sodium chloride (NS) 0.9 % flush 10 mL  10 mL Intravenous BID Herbert Moors, AGNP       ??? sodium chloride (NS) 0.9 % flush 10 mL  10 mL Intravenous BID Herbert Moors, AGNP   10 mL at 06/19/20 2326   ??? sodium chloride (NS) 0.9 % infusion  20 mL/hr Intravenous Continuous Herbert Moors, AGNP       ??? sodium chloride (NS) 0.9 % infusion  20 mL/hr Intravenous Continuous PRN Guerry Bruin, MD       ??? sodium chloride 0.9% (NS) bolus 1,000 mL  1,000 mL Intravenous Daily PRN Guerry Bruin, MD       ??? sulfamethoxazole-trimethoprim (BACTRIM DS) 800-160 mg tablet 160 mg of trimethoprim  1 tablet Oral BID Herbert Moors, AGNP       ??? valACYclovir (VALTREX) tablet 500 mg  500 mg Oral Daily Herbert Moors, [place]           Physical Exam:  General: Resting, in no apparent distress, sitting up on the side of the bed   HEENT:  PER. No scleral icterus or conjunctival injection. MMM   Heart:  RRR. S1, S2. No murmurs, gallops, or rubs.  Lungs:  Breathing is unlabored, and patient is speaking full sentences with ease. CTAB.   Abdomen:  Nontender, nondistended. BS normoactive x 4. No palpable masses.  Skin:  No rashes, petechiae or purpura.  No areas of skin breakdown. Warm to touch, dry, smooth, and even.  Musculoskeletal:  No grossly-evident joint effusions or deformities.    Psychiatric:  Range of affect is appropriate.    Neurologic:  Alert and oriented to person, place, time and situation. CNII-CNXII grossly intact. ICE 10/10.   Extremities:  Appear well-perfused. No clubbing, edema, or cyanosis.  CVAD: R CW Port - no erythema, nontender; dressing CDI.      Labs:  Recent Labs     06/19/20  1000 06/20/20  0646   WBC 16.9* 15.5*   NEUTROABS 12.7* 14.7*   LYMPHSABS 2.3 0.2*   HGB 8.2* 8.9*   HCT 23.3* 25.8*   PLT 294 248   CREATININE 2.02*  --    BUN 28*  --    BILITOT 0.3  --    BILIDIR <0.10  --    AST 19  --    ALT 16  --    ALKPHOS 81  --    K 3.4  --    MG 1.2*  --    CALCIUM 8.2*  --    NA 139  --    CL 105  --    CO2 22.0  --    PHOS 4.7  --    CRP <4.0  --        Imaging:  No new.    Dionne Ano Shantai Tiedeman, PA-C  06/20/2020  8:10 AM   Physician  Assistant   Hematology/Oncology Department   [facility]   Group pager: [phone]

## 2020-06-20 NOTE — Unmapped (Signed)
Patient admitted to 4 ONC overnight to begin blinatumomab infusion. Treatment started at approximately 2330 with no adverse events. Patient also given first dose of ponatinib. Free from falls and injury. Patient expressed no concerns at this time. Discharge pending.       Problem: Adult Inpatient Plan of Care  Goal: Plan of Care Review  Outcome: Ongoing - Unchanged  Goal: Patient-Specific Goal (Individualized)  Outcome: Ongoing - Unchanged  Goal: Absence of Hospital-Acquired Illness or Injury  Outcome: Ongoing - Unchanged  Intervention: Identify and Manage Fall Risk  Recent Flowsheet Documentation  Taken 06/19/2020 2325 by Fleeta Emmer, RN  Safety Interventions:   bleeding precautions   chemotherapeutic agent precautions   commode/urinal/bedpan at bedside   environmental modification   family at bedside   lighting adjusted for tasks/safety   low bed   nonskid shoes/slippers when out of bed  Intervention: Prevent Skin Injury  Recent Flowsheet Documentation  Taken 06/19/2020 2230 by Fleeta Emmer, RN  Skin Protection: adhesive use limited  Goal: Optimal Comfort and Wellbeing  Outcome: Ongoing - Unchanged  Goal: Readiness for Transition of Care  Outcome: Ongoing - Unchanged  Goal: Rounds/Family Conference  Outcome: Ongoing - Unchanged

## 2020-06-20 NOTE — Unmapped (Cosign Needed)
Hematology/Oncology     Attending Physician :  Providence Crosby Dittu*  Accepting Service  : Oncology/Hematology (MDE)  Reason for Admission:  Cycle 1 MRD BLinatumomab    Problem List:   Patient Active Problem List   Diagnosis   ??? Anxiety   ??? Tobacco use disorder   ??? AKI (acute kidney injury) (CMS-HCC)   ??? Transaminitis   ??? 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 Hyperphosphatemia   ??? MRSA bacteremia   ??? Septic shock due to Staphylococcus aureus (CMS-HCC)   ??? Hyperkalemia   ??? Hiccups   ??? Hypernatremia   ??? Red blood cell antibody positive   ??? Hypokalemia   ??? Hypomagnesemia   ??? COVID-19   ??? Dyspnea   ??? Depression with anxiety         Assessment/Plan: Adam Keith is an 40 y.o. 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 The course was complicated by septic shock, candida krusei fungemia, MRSA bacteremia with septic emboli c/b acute renal failure and respiratory distress requiring dialysis and intubation.  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 Ultimately due to CKD, performance status, COVID infection and significant complication noted above cycle 2 of HyperCVAD was delayed and ultimately differed in favor of Blinatumomab. Last Bone marrow biopsy on 1/18 showed a normo cellular marrow with less than 1% Blasts with 0.036 is ration for BCR/ABL.      Primary Oncologist: [name]  Chemotherapy: Blinatumomab (for MRD)  Cycle: 1 D1 = 3/18@ 2100  - Blinatumomab 28 mcg days 1-3 (as inpatient)  - Plan to move timing of dose 4 to convenient time for home health bag changes  - Blinatumomab 28 mcg days 4-28 as an outpatient  - Dexamethasone 20 mg day 1    -Daily Ponatinib 30mg   [ ]  Assess for neuro toxicity - ICE score daily, include handwriting  [ ]  Assess for cytokine release syndrome    Toxicities  - Frequent toxicities - pyrexia (60%), headache (34%), febrile neutropenia (28%), peripheral edema (26%), nausea (24%), hypokalemia (24%), constipation (21%), anemia (20%)  - 52% neurologic toxicity (mostly grade 1-2)    Prophylaxis:  Valacyclovir 500 mg PO daily  - Bactrim DS on Saturday/Sunday    Disposition   - Day prior to discharge, ensure nursing has instructions for discharge process for blinatumomab   - Bag changes at [facility] infusion due 3/21@1300   - twice weekly lab checks, and CVAD dressing changes.   [ ]  F/U appointment in clinic after cycle +/- bone marrow biopsy     AKI-->CKD, due to sepsis. Now s/p dialysis  - stable Cr appears to have developed CKD due to incompletely healed acute injury.  -Dose medicines for creatinine clearance of approximately 30-35    DRESS:  Appreciate consultation with Dr. Caryn Section.  He will continue prednisone taper and PRN hydroxyzine. Timing of onset occurred proximate to time when dose of dasatinib was being adjusted, posaconazole given and sotrovimab.  Likely one of these changes was responsible for the DRESS  [ ]  Sutures will need to be removed   -Prednisone taper  50mg  daily X7 day (3/18-3/25) then taper by 10mg  daily each week    History of Dyspnea, PND, edema, pleural effusion:  Likely due to dasatinib.  Monitor after therapy change.  - No current symptoms of sob   ??  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  Patient reports hypersensitivity of left hearing and difficulty with complex sounds and multiple conversations  - AVOID LAsix/loop diuretics, other ototoxic medications  ??  HTN: Patient with persistent hypertension will continue home regimen as blewlow  - amlodipine, coreg    Chronic Back Pain, complicated by hospital stay and procedures  - now transitioned to Tramadol 100mg  nightly  [ ]  PT/OT consulted  ??  History of Candidemia  Per [name] and Dr [name] they feel that the risk of recurrent fungal infection is low if we can avoid neutropenia. No antifungal ppx at this time.  ??  Peripheral neuropathy, sensory and motor  - Grade 1-2  Present on admission has not changed since induction therapy  - Continue to monitor for worsening     Immunocompromised status: Patient is immunocompromised secondary  PH+ ALL disease and chemotherapy  -Antimicrobial prophylaxis as above     Hypokalemia secondary to CKD, present on admission      Last potassium level   Lab Results   Component Value Date    K 3.4 06/19/2020      Patient is receiving potassium repletion by Manual repletion per [facility] protocool  Potassium level checked Daily       Hypomagnesemia secondary to CKD, present on admision    Patient is currently receiving magnesium repletion via Manual repletion per [facility] protocool  Last magnesium level   Lab Results   Component Value Date    MG 1.2 (L) 06/19/2020     Magnesium levels checked with  Daily and repleted as above  Oral scheduled repletion has been considered, patient currently declining       Impending Pancytopenia secondary to Acute Leukemia/chemotherapy:   - Transfuse 1 unit of pRBCs for hgb >7  - Transfuse 1 unit plt for plts >10K       HPI: Adam Keith is an 40 y.o. male who presented for consideration of Cycle 1 of MRD Blinatumomab with Ponatinib.  Patient reports feeling generally well with improvement of fatigue, pain and performance status following tie from induction chemotherapy and subsequent prolonged hospital stay.    Denies headache, changes in vision, sore throat, cough, SOB, chest pain, abdominal pain, N/V/D/C, changes in urination, fevers, night sweats or chills.     Review of Systems: All positive and pertinent negatives are noted in the HPI; otherwise all other systems are negative    Oncologic History:   Primary Oncologist: [name]  Oncology History Overview Note   Referring/Local Oncologist: None    Diagnosis:Ph+ ALL    Genetics:    Karyotype/FISH:Abnormal Karyotype: 46,XY,t(9;22)(q34;q11.2)[1]/45,XY,der(7;9)(q10;q10)t(9;22)(q34;q11.2),der(22)t(9;22)[11]/46,sdl,+der(22)t(9;22)[5]/46,XY[3]     Abnormal FISH: A BCR/ABL1 interphase FISH assay shows an abnormal signal pattern in 97% of the 100 cells scored. 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  The findings support a diagnosis of leukemia and have implications for targeted therapy and for monitoring residual disease.      Molecular Genetics:  BCR-ABL1 p210 transcripts were detected at a level of 46.479 IS% ratio in bone marrow.  BCR-ABL1 p190 transcripts were detected at a level of 4 in 100,000 cells in bone marrow.    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)     01/24/2020 - 04/02/2020 Chemotherapy    IP/OP LEUKEMIA GRAAPH-2005 + RITUXIMAB < 60 YO  rituximab hypercvad (odd and even course)     02/24/2020 Remission    CR with PCR MRD+; marrow showing 70% blasts with <1% blasts, negative flow MRD, BCR-ABL p210 PCR 0.197%; CNS negative for blasts     04/16/2020 Biopsy    BM Bx with 40-50% cellularity, <1% blasts, MRD flow cytometry negative, BCR-ABL p210 transcripts 0.036%     05/28/2020 - 05/28/2020 Chemotherapy    OP AML - CNS THERAPY (INTRATHECAL CYTARABINE, INTRATHECAL METHOTREXATE, OR INTRATHECAL TRIPLE)  Select one of the following: cytarabine IT 100 mg with hydrocortisone 50 mg, cytarabine IT 40 mg with methotrexate 15 mg with hydrocortisone 50 mg, OR methotrexate IT 12 mg with hydrocortisone 50 mg         Medical History:  PCP: Dierdre Harness, MD  Past Medical History:   Diagnosis Date   ??? 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 Surgical History:  Past Surgical History:   Procedure Laterality Date   ??? BONE MARROW BIOPSY & ASPIRATION  01/21/2020        ??? IR INSERT PORT AGE GREATER THAN 5 YRS  03/10/2020    IR INSERT PORT AGE GREATER THAN 5 YRS 03/10/2020 [name], MD IMG VIR [facility]      Social History:  Social History     Socioeconomic History   ??? Marital status: Married     Spouse name: Not on file   ??? Number of children: Not on file   ??? Years of education: Not on file   ??? Highest education level: Not on file   Occupational History   ??? Not on file   Tobacco Use   ??? Smoking status: Former Smoker     Packs/day: 2.00     Types: Cigarettes     Quit date: 01/20/2020     Years since quitting: 0.4   ??? Smokeless tobacco: Former Neurosurgeon     Types: Musician Use   ??? Vaping Use: Never used   Substance and Sexual Activity   ??? Alcohol use: Not on file   ??? Drug use: Not on file   ??? Sexual activity: Not on file   Other Topics Concern   ??? Do you use sunscreen? No   ??? Tanning bed use? No   ??? Are you easily burned? Yes   ??? Excessive sun exposure? No   ??? Blistering sunburns? Yes   Social History Narrative   ??? Not on file     Social Determinants of Health     Financial Resource Strain: Low Risk    ??? Difficulty of Paying Living Expenses: Not very hard   Food Insecurity: No Food Insecurity   ??? Worried About Running Out of Food in the Last Year: Never true   ??? Ran Out of Food in the Last Year: Never true   Transportation Needs: No Transportation Needs   ??? Lack of Transportation (Medical): No   ??? Lack of Transportation (Non-Medical): No   Physical Activity: Not on file   Stress: Not on file   Social Connections: Not on file      Family History:   Patient denies family history of cancer or hematologic disorders.       Allergies: is  allergic to bupropion hcl, dapsone, onion, vancomycin analogues, bismuth subsalicylate, ceftaroline fosamil, and furosemide.    Medications:   Meds:  ??? [START ON 06/20/2020] amLODIPine  10 mg Oral Daily   ??? blinatumomab (BLINCYTO) 15 mcg/m2/day (max 28 mcg/day) 24-hr infusion  28 mcg/day Intravenous over 24 hr   ??? carvediloL  12.5 mg Oral BID   ??? dexamethasone  16 mg Intravenous Once   ??? mirtazapine  15 mg Oral Nightly   ??? PONATinib  30 mg Oral Q24H   ??? [START ON 06/20/2020] predniSONE  50 mg Oral Daily   ??? sodium chloride  10 mL Intravenous BID   ??? sodium chloride  10 mL Intravenous BID   ??? sodium chloride  10 mL Intravenous BID   ??? [START ON 06/20/2020] sulfamethoxazole-trimethoprim  1 tablet Oral BID   ??? [START ON 06/20/2020] valACYclovir  500 mg Oral Daily     Continuous Infusions:  ??? IP okay to treat     ??? sodium chloride     ??? sodium chloride       PRN Meds:.diphenhydrAMINE, EPINEPHrine IM, famotidine (PEPCID) IV, hydrOXYzine, IP okay to treat, loperamide, loperamide, meperidine, methylPREDNISolone sodium succinate (PF), prochlorperazine, prochlorperazine, sodium chloride, sodium chloride 0.9%    Objective:   Vitals: Temp:  [36.7 ??C (98.1 ??F)] 36.7 ??C (98.1 ??F)  Heart Rate:  [90] 90  Resp:  [18] 18  BP: (151)/(82) 151/82  BMI (Calculated):  [29.09] 29.09    Physical Exam:  General: Resting, in no apparent distress, lying in bed  HEENT:  PERRL. No scleral icterus or conjunctival injection. MMM without ulceration, erythema or exudate. No cervical or axillary lymphadenopathy.   Heart:  RRR. S1, S2. No murmurs, gallops, or rubs.  Lungs:  Breathing is unlabored, and patient is speaking full sentences with ease.  No stridor.  CTAB. No rales, ronchi, or crackles.    Abdomen:  No distention or pain on palpation.  Bowel sounds are present and normoactive x 4.  No palpable hepatomegaly or splenomegaly.  No palpable masses.  Skin:  No rashes, petechiae or purpura.  No areas of skin breakdown. Warm to touch, dry, smooth, and even.  Musculoskeletal:  No grossly-evident joint effusions or deformities.  Range of motion about the shoulder, elbow, hips and knees is grossly normal.  Psychiatric:  Range of affect is appropriate.    Neurologic:  Alert and oriented to person, place, time and situation.  Gait is normal.  No Nystagmus Cerebellar tasks (finger-to-nose, rapid hand movement, heel along shin) are completed with ease and are symmetric. CNII-CNXII grossly intact.  Extremities:  Appear well-perfused. No clubbing, edema, or cyanosis.  CVAD: R CW Port - no erythema, nontender; dressing CDI.      Test Results  Recent Labs     06/19/20  1000   WBC 16.9*   NEUTROABS 12.7*   HGB 8.2*   PLT 294     Recent Labs     06/19/20  1000   NA 139   K 3.4   CL 105   CO2 22.0   BUN 28*   CREATININE 2.02*   MG 1.2*   PHOS 4.7   CALCIUM 8.2*       Imaging: None    DVT PPX Indicated: no, ambulatory  FEN:  Discharge Plan:  - fluids: no  - electrolytes: Daily repletion  - diet: regular     Need for PT: yes  Anticipated Discharge: her home    Code Status: @PATFPLSTATCODE @  Full Code      Time spent on counseling/coordination of care: 1 Hour 15 Minutes  Total time spent with patient: 45 Minutes    Vinisha Faxon K. Oliva Bustard  Nurse Practitioner  Hematology/Oncology  [facility]    Group pager: [phone]

## 2020-06-21 LAB — CBC W/ AUTO DIFF
BASOPHILS ABSOLUTE COUNT: 0 10*9/L (ref 0.0–0.1)
BASOPHILS RELATIVE PERCENT: 0.2 %
EOSINOPHILS ABSOLUTE COUNT: 0 10*9/L (ref 0.0–0.5)
EOSINOPHILS RELATIVE PERCENT: 0 %
HEMATOCRIT: 24 % — ABNORMAL LOW (ref 39.0–48.0)
HEMOGLOBIN: 8.3 g/dL — ABNORMAL LOW (ref 12.9–16.5)
LYMPHOCYTES ABSOLUTE COUNT: 0.3 10*9/L — ABNORMAL LOW (ref 1.1–3.6)
LYMPHOCYTES RELATIVE PERCENT: 1.3 %
MEAN CORPUSCULAR HEMOGLOBIN CONC: 34.7 g/dL (ref 32.0–36.0)
MEAN CORPUSCULAR HEMOGLOBIN: 31.6 pg (ref 25.9–32.4)
MEAN CORPUSCULAR VOLUME: 91.1 fL (ref 77.6–95.7)
MEAN PLATELET VOLUME: 7.4 fL (ref 6.8–10.7)
MONOCYTES ABSOLUTE COUNT: 1.5 10*9/L — ABNORMAL HIGH (ref 0.3–0.8)
MONOCYTES RELATIVE PERCENT: 7.8 %
NEUTROPHILS ABSOLUTE COUNT: 17.4 10*9/L — ABNORMAL HIGH (ref 1.8–7.8)
NEUTROPHILS RELATIVE PERCENT: 90.7 %
PLATELET COUNT: 222 10*9/L (ref 150–450)
RED BLOOD CELL COUNT: 2.63 10*12/L — ABNORMAL LOW (ref 4.26–5.60)
RED CELL DISTRIBUTION WIDTH: 19.3 % — ABNORMAL HIGH (ref 12.2–15.2)
WBC ADJUSTED: 19.2 10*9/L — ABNORMAL HIGH (ref 3.6–11.2)

## 2020-06-21 LAB — HEPATIC FUNCTION PANEL
ALBUMIN: 3.3 g/dL — ABNORMAL LOW (ref 3.4–5.0)
ALKALINE PHOSPHATASE: 86 U/L (ref 46–116)
ALT (SGPT): 12 U/L (ref 10–49)
AST (SGOT): 17 U/L (ref <=34)
BILIRUBIN DIRECT: <0.1 mg/dL (ref 0.00–0.30)
BILIRUBIN TOTAL: 0.3 mg/dL (ref 0.3–1.2)
PROTEIN TOTAL: 5.8 g/dL (ref 5.7–8.2)

## 2020-06-21 LAB — BASIC METABOLIC PANEL
ANION GAP: 9 mmol/L (ref 5–14)
BLOOD UREA NITROGEN: 23 mg/dL (ref 9–23)
BUN / CREAT RATIO: 12
CALCIUM: 8.6 mg/dL — ABNORMAL LOW (ref 8.7–10.4)
CHLORIDE: 110 mmol/L — ABNORMAL HIGH (ref 98–107)
CO2: 22 mmol/L (ref 20.0–31.0)
CREATININE: 1.94 mg/dL — ABNORMAL HIGH
EGFR CKD-EPI AA MALE: 49 mL/min/{1.73_m2} — ABNORMAL LOW (ref >=60)
EGFR CKD-EPI NON-AA MALE: 42 mL/min/{1.73_m2} — ABNORMAL LOW (ref >=60)
GLUCOSE RANDOM: 108 mg/dL (ref 70–179)
POTASSIUM: 3.7 mmol/L (ref 3.4–4.8)
SODIUM: 141 mmol/L (ref 135–145)

## 2020-06-21 LAB — C-REACTIVE PROTEIN: C-REACTIVE PROTEIN: 15 mg/L — ABNORMAL HIGH (ref <=10.0)

## 2020-06-21 LAB — PHOSPHORUS: PHOSPHORUS: 1.9 mg/dL — ABNORMAL LOW (ref 2.4–5.1)

## 2020-06-21 LAB — MAGNESIUM: MAGNESIUM: 1.6 mg/dL (ref 1.6–2.6)

## 2020-06-21 LAB — HIGH SENSITIVITY TROPONIN I - SINGLE: HIGH SENSITIVITY TROPONIN I: 16 ng/L (ref <=53)

## 2020-06-21 MED ADMIN — famotidine (PF) (PEPCID) injection 20 mg: 20 mg | INTRAVENOUS | @ 03:00:00

## 2020-06-21 MED ADMIN — HYDROmorphone (PF) (DILAUDID) injection 0.5 mg: 0.5 mg | INTRAVENOUS | @ 05:00:00 | Stop: 2020-06-21

## 2020-06-21 MED ADMIN — predniSONE (DELTASONE) tablet 50 mg: 50 mg | ORAL | @ 14:00:00

## 2020-06-21 MED ADMIN — amLODIPine (NORVASC) tablet 10 mg: 10 mg | ORAL | @ 14:00:00

## 2020-06-21 MED ADMIN — PONATinib (ICLUSIG) tablet 30 mg **PATIENT SUPPLIED**: 30 mg | ORAL | @ 23:00:00

## 2020-06-21 MED ADMIN — sulfamethoxazole-trimethoprim (BACTRIM DS) 800-160 mg tablet 160 mg of trimethoprim: 1 | ORAL | @ 14:00:00 | Stop: 2020-07-25

## 2020-06-21 MED ADMIN — LORazepam (ATIVAN) injection 0.5 mg: .5 mg | INTRAVENOUS | @ 04:00:00 | Stop: 2020-06-21

## 2020-06-21 MED ADMIN — HYDROmorphone (DILAUDID) tablet 2 mg: 2 mg | ORAL | @ 08:00:00 | Stop: 2020-06-21

## 2020-06-21 MED ADMIN — valACYclovir (VALTREX) tablet 500 mg: 500 mg | ORAL | @ 14:00:00

## 2020-06-21 MED ADMIN — blinatumomab (BLINCYTO) 28 mcg/day in sodium chloride 0.9% NON-PVC 0.9 % IVPB (24-HR INFUSION): 28 ug/d | INTRAVENOUS | @ 17:00:00 | Stop: 2020-06-22

## 2020-06-21 MED ADMIN — diphenhydrAMINE (BENADRYL) injection 25 mg: 25 mg | INTRAVENOUS | @ 03:00:00

## 2020-06-21 MED ADMIN — carvediloL (COREG) tablet 12.5 mg: 12.5 mg | ORAL | @ 14:00:00

## 2020-06-21 MED ADMIN — sodium chloride (NS) 0.9 % flush 10 mL: 10 mL | INTRAVENOUS | @ 14:00:00 | Stop: 2020-06-21

## 2020-06-21 MED ADMIN — dexamethasone (DECADRON) 4 mg/mL injection 16 mg: 16 mg | INTRAVENOUS | @ 17:00:00 | Stop: 2020-06-21

## 2020-06-21 MED ADMIN — mirtazapine (REMERON) tablet 15 mg: 15 mg | ORAL | @ 02:00:00

## 2020-06-21 MED ADMIN — sulfamethoxazole-trimethoprim (BACTRIM DS) 800-160 mg tablet 160 mg of trimethoprim: 1 | ORAL | @ 02:00:00 | Stop: 2020-07-25

## 2020-06-21 MED ADMIN — carvediloL (COREG) tablet 12.5 mg: 12.5 mg | ORAL | @ 02:00:00

## 2020-06-21 MED ADMIN — methylPREDNISolone sodium succinate (PF) (Solu-MEDROL) injection 125 mg: 125 mg | INTRAVENOUS | @ 03:00:00

## 2020-06-21 NOTE — Unmapped (Signed)
Problem: Adult Inpatient Plan of Care  Goal: Plan of Care Review  Outcome: Ongoing - Unchanged  Goal: Patient-Specific Goal (Individualized)  Outcome: Ongoing - Unchanged  Goal: Absence of Hospital-Acquired Illness or Injury  Outcome: Ongoing - Unchanged  Intervention: Prevent Skin Injury  Recent Flowsheet Documentation  Taken 06/20/2020 2040 by Fleeta Emmer, RN  Skin Protection: adhesive use limited  Goal: Optimal Comfort and Wellbeing  Outcome: Ongoing - Unchanged  Goal: Readiness for Transition of Care  Outcome: Ongoing - Unchanged  Goal: Rounds/Family Conference  Outcome: Ongoing - Unchanged     Problem: Impaired Wound Healing  Goal: Optimal Wound Healing  Outcome: Ongoing - Unchanged     Problem: Hypertension Comorbidity  Goal: Blood Pressure in Desired Range  Outcome: Ongoing - Unchanged

## 2020-06-21 NOTE — Unmapped (Signed)
Care Management  Initial Transition Planning Assessment              General  Care Manager assessed the patient by : In person interview with patient, Medical record review, In person interview with family, Telephone conversation with patient (Wife at bedside)  Orientation Level: Oriented X4  Functional level prior to admission: Independent  Reason for referral: Home Health    CM met with patient in pt room.  Pt/visitors were not wearing hospital provided masks for the duration of the interaction with CM.   CM was wearing hospital provided surgical mask and hospital provided eye protection.  CM was not within 6 foot of the patient/visitors during this interaction.     Contact/Decision Maker  Extended Emergency Contact Information  Primary Emergency Contact: Quach, tonya  Mobile Phone: [phone]  Relation: Spouse    Legal Next of Kin / Guardian / POA / Advance Directives     HCDM (patient stated preference): Hadden, [name] Spouse - [phone]    Advance Directive (Medical Treatment)  Does patient have an advance directive covering medical treatment?: Patient does not have advance directive covering medical treatment.  Reason patient does not have an advance directive covering medical treatment:: Patient does not wish to complete one at this time.    Health Care Decision Maker [HCDM] (Medical & Mental Health Treatment)  Healthcare Decision Maker: HCDM documented in the HCDM/Contact Info section.  Information offered on HCDM, Medical & Mental Health advance directives:: Patient declined information.    Advance Directive (Mental Health Treatment)  Does patient have an advance directive covering mental health treatment?: Patient does not have advance directive covering mental health treatment.    Patient Information  Lives with: Spouse/significant other, Children    Type of Residence: Private residence       Type of Residence: Physical Address: [address] [place], [place] [address]  Contacts: Patient Phone Number:   Telephone Information:   Mobile [phone]            Medical Provider(s): [name], MD  Reason for Admission: Admitting Diagnosis:  leukemia  Past Medical History:   has a past medical history of Red blood cell antibody positive (02/14/2020).  Past Surgical History:   has a past surgical history that includes Bone Marrow Biopsy & Aspiration (01/21/2020) and IR Insert Port Age Greater Than 5 Years (03/10/2020).   Previous admit date: 04/01/2020    Primary Insurance- Payor: MEDICAID [place] / Plan: MEDICAID [place] / Product Type: *No Product type* /   Secondary Insurance ??? None  Prescription Coverage ??? Yes  Preferred Pharmacy - [facility]  [id number] [place], [place] - [address]  [facility]    Transportation home: Private vehicle, wife will drive patient home at discharge       Support Systems/Concerns: Children, Family Members, Spouse    Responsibilities/Dependents at home?: Yes (Describe)    Home Care services in place prior to admission?: No                  Equipment Currently Used at Home: oxygen, walker, rolling, commode chair, shower chair       Currently receiving outpatient dialysis?: No       Financial Information       Need for financial assistance?: No       Social Determinants of Health  Social Determinants of Health     Tobacco Use: Medium Risk   ???  Smoking Tobacco Use: Former Smoker   ??? Smokeless Tobacco Use: Former Neurosurgeon   Alcohol Use: Not on Programmer, applications Strain: Low Risk    ??? Difficulty of Paying Living Expenses: Not very hard   Food Insecurity: No Food Insecurity   ??? Worried About Running Out of Food in the Last Year: Never true   ??? Ran Out of Food in the Last Year: Never true   Transportation Needs: No Transportation Needs   ??? Lack of Transportation (Medical): No   ??? Lack of Transportation (Non-Medical): No   Physical Activity: Not on file   Stress: Not on file   Social Connections: Not on file   Intimate Partner Violence: Not on file   Depression: Not on file   Housing/Utilities: Low Risk    ??? Within the past 12 months, have you ever stayed: outside, in a car, in a tent, in an overnight shelter, or temporarily in someone else's home (i.e. couch-surfing)?: No   ??? Are you worried about losing your housing?: No   ??? Within the past 12 months, have you been unable to get utilities (heat, electricity) when it was really needed?: No   Substance Use: Not on file   Health Literacy: Not on file       Discharge Needs Assessment  Concerns to be Addressed: discharge planning    Clinical Risk Factors: Principal Diagnosis: Cancer, Stroke, COPD, Heart Failure, AMI, Pneumonia, Joint Replacment    Barriers to taking medications: No    Prior overnight hospital stay or ED visit in last 90 days: Yes    Readmission Within the Last 30 Days: no previous admission in last 30 days         Anticipated Changes Related to Illness: none    Equipment Needed After Discharge: none    Discharge Facility/Level of Care Needs: other (see comments) (home with infusion services)    Readmission  Risk of Unplanned Readmission Score: UNPLANNED READMISSION SCORE: 37%  Predictive Model Details          37% (High)  Factor Value    Calculated 06/21/2020 16:03 21% Number of active Rx orders 46    [place] Risk of Unplanned Readmission Model 7% Number of ED visits in last six months 2     7% Diagnosis of cancer present     6% Number of hospitalizations in last year 2     6% Active antipsychotic Rx order present     6% ECG/EKG order present in last 6 months     6% Latest calcium low (8.6 mg/dL)     5% Encounter of ten days or longer in last year present     5% Diagnosis of electrolyte disorder present     5% Restraint order present in last 6 months     4% Imaging order present in last 6 months     4% Latest hemoglobin low (8.3 g/dL)     4% Phosphorous result present     3% Active corticosteroid Rx order present     3% Latest creatinine high (1.94 mg/dL) 2% Age 40     2% Charlson Comorbidity Index 2     1% Current length of stay 2.084 days     1% Future appointment scheduled     1% Active ulcer medication Rx order present      Readmitted Within the Last 30 Days? (No if blank)   Patient at risk for readmission?: Yes    Discharge Plan  Screen findings are: Discharge planning needs identified or anticipated (Comment). (home infusion services)    Expected Discharge Date: 06/23/2020        Quality data for continuing care services shared with patient and/or representative?: N/A  Patient and/or family were provided with choice of facilities / services that are available and appropriate to meet post hospital care needs?: N/A   List choices in order highest to lowest preferred, if applicable. : Has used [facility] for infusion in the past, would like to use again    Initial Assessment complete?: Yes

## 2020-06-21 NOTE — Unmapped (Signed)
Patient afebrile. Vitals stable and normalized after initial blinatumomab reaction that occurred overnight. Rapid response was called overnight d/t infusion reaction- see previous notes for further details. Oral ponatinib held overnight after reaction and on call ONC fellow and attending physician were contacted regarding whether ponatinib should be administered. Oral agent was ultimately held while waiting for further instruction from attending physician. Patient free from falls and injury. Patient's wife at bedside entire shift and active in patient care. Pt c/o anxiety and medicated with prn ativan with relief of symptoms noted. Pt also c/o pain and medicated with oral and IV pain meds with relief noted. See previous nursing notes for more details regarding shift. Discharge pending at this time.

## 2020-06-21 NOTE — Unmapped (Signed)
Heme/Onc APP Daily Progress Note        Active Problems:    * No active hospital problems. *       LOS: 2 days     Assessment/Plan: Adam Keith is an 40 y.o. 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    Plan Summary: C1D3 Blinatumomab = 3/20. Drug stopped overnight for Grade 1 reaction. Patient with rash and temporal edema, given complicated reaction, drug stopped. Restarted today after discussion. Re-dosed with Dex IV and continued 28 mcg/day Blinatumomab dosing. Continue Valtrex, Bactrim ppx. Continue Ponatinib. Continue Prednisone taper, currently at 50 mg. Continue Amlodipine and Coreg daily. Further supportive care and chronic meds as noted below.     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 The course was complicated by septic shock, candida krusei fungemia, MRSA bacteremia with septic emboli c/b acute renal failure and respiratory distress requiring dialysis and intubation. ??Post-induction bmbx (day 29) demonstrated flow-based??MRD-negative remission but low-level BCR-ABL persisted. Ultimately due to CKD, performance status, COVID infection and significant complication noted above cycle 2 of HyperCVAD was delayed and ultimately differed in favor of Blinatumomab. Last Bone marrow biopsy on 1/18 showed a normo cellular marrow with less than 1% Blasts with 0.036 is ration for BCR/ABL.    - Valtrex, Bactrim ppx   - Potnatinib 30 mg daily   ??  Primary Oncologist: [name]  Chemotherapy: Blinatumomab (for MRD)  Cycle: 1 D1 = 3/18@ 2100  - Blinatumomab 28 mcg days 1-3 (as inpatient)  - Plan to move timing of dose 4 to convenient time for home health bag changes  - Blinatumomab 28 mcg days 4-28 as an outpatient  - Dexamethasone 20 mg day 1    [ ]  Assess for neuro toxicity - ICE score daily, include handwriting  [ ]  Assess for cytokine release syndrome  ??  Toxicities  - Frequent toxicities - pyrexia (60%), headache (34%), febrile neutropenia (28%), peripheral edema (26%), nausea (24%), hypokalemia (24%), constipation (21%), anemia (20%)  - 52% neurologic toxicity (mostly grade 1-2)  ??  Disposition   - Day prior to discharge, ensure nursing has instructions for discharge process for blinatumomab   - Bag changes at [facility] infusion due 3/21@1300    - twice weekly lab checks, and CVAD dressing changes.   [ ]  F/U appointment in clinic after cycle +/- bone marrow biopsy     Reaction Log:   - 3/19 PM, Grade 1 reaction with upper torso erythema and temporal edema. Given complicated reaction with temporal edema, drug stopped. Patient was medicated with reaction meds. Drug restarted 3/20 after re-dosing with Dexamethasone.   ??  AKI to CKD: due to sepsis. Now s/p dialysis with stable Cr appears to have developed CKD due to incompletely healed acute injury.  - Dose medicines for creatinine clearance of approximately 30-35  ??  DRESS: ??Followed by Dr. Caryn Section with [facility]. S/p punch biopsy on 3/11 and will need sutures removed 14 days after placement (3/25). He will continue prednisone taper and PRN hydroxyzine. Timing of onset occurred proximate to time when dose of dasatinib was being adjusted, posaconazole given and sotrovimab. ??Likely one of these changes was responsible for the DRESS.   [ ]  Sutures will need to be removed ~3/25   - Prednisone taper 50mg  daily X7 day (3/18-3/25) then taper by 10mg  daily each week  ??  History of Dyspnea, PND, edema, pleural effusion:??Likely due to dasatinib. Monitor after therapy change.  -  No current symptoms of SOB  - CTM    ??  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 Patient reports hypersensitivity of left hearing and difficulty with complex sounds and multiple conversations  - AVOID LAsix/loop diuretics, other ototoxic medications  ??  HTN: Chronic condition since diagnosis. Home regimen includes Amlodipine and Coreg.   - Amlodipine daily   - Coreg daily   ??  Chronic Back Pain: Complicated by hospital stay and procedures.   - Tramadol 100mg  nightly  [ ]  PT/OT consulted  ??  History of Candidemia: Per [name] and Dr [name]??they feel that the risk of recurrent fungal infection is low if we can avoid neutropenia. No antifungal ppx at this time.  ??  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 However patient with RRT called overnight for drug reaction to Blinatumomab. Patient with upper body rash and temporal edema. Increased anxiety after reaction, see significant event note for further details. Work up negative for CP developed after RRT. This morning patient feeling much better, still having some itching although rash has resolved. Denies HA, CP, SOB, chills, rigors, or tremors.       10 point ROS otherwise negative except as above in the HPI.     Objective:     Vital signs in last 24 hours:  Temp:  [36.4 ??C (97.5 ??F)-36.9 ??C (98.4 ??F)] 36.5 ??C (97.7 ??F)  Heart Rate:  [82-103] 82  Resp:  [16-22] 16  BP: (142-195)/(67-97) 142/67  MAP (mmHg):  [103-135] 123  SpO2:  [96 %-100 %] 99 %    Intake/Output last 3 shifts:  No intake/output data recorded.    Meds:  Current Facility-Administered Medications   Medication Dose Route Frequency Provider Last Rate Last Admin   ??? amLODIPine (NORVASC) tablet 10 mg  10 mg Oral Daily Herbert Moors, AGNP   10 mg at 06/20/20 1914   ??? blinatumomab (BLINCYTO) 28 mcg/day in sodium chloride 0.9% NON-PVC 0.9 % IVPB (24-HR INFUSION)  28 mcg/day Intravenous over 24 hr Guerry Bruin, MD   Stopped at 06/20/20 2259   ??? carvediloL (COREG) tablet 12.5 mg  12.5 mg Oral BID Herbert Moors, AGNP   12.5 mg at 06/20/20 2200   ??? diphenhydrAMINE (BENADRYL) injection 25 mg  25 mg Intravenous Q4H PRN Guerry Bruin, MD   25 mg at 06/20/20 2308   ??? EPINEPHrine (EPIPEN) injection 0.3 mg  0.3 mg Intramuscular Daily PRN Guerry Bruin, MD       ??? famotidine (PF) (PEPCID) injection 20 mg  20 mg Intravenous Q4H PRN Guerry Bruin, MD   20 mg at 06/20/20 2304   ??? HYDROmorphone (DILAUDID) tablet 2 mg  2 mg Oral Q4H PRN Mariana Kaufman, PA   2 mg at 06/21/20 0410   ??? hydrOXYzine (ATARAX) tablet 25 mg  25 mg Oral Q6H PRN Herbert Moors, AGNP       ??? IP OKAY TO TREAT   Other Continuous PRN Herbert Moors, AGNP       ???  loperamide (IMODIUM) capsule 2 mg  2 mg Oral Q2H PRN Herbert Moors, AGNP       ??? loperamide (IMODIUM) capsule 4 mg  4 mg Oral Once PRN Herbert Moors, AGNP       ??? meperidine (DEMEROL) injection 25 mg  25 mg Intravenous Q30 Min PRN Guerry Bruin, MD       ??? methylPREDNISolone sodium succinate (PF) (Solu-MEDROL) injection 125 mg  125 mg Intravenous Q4H PRN Guerry Bruin, MD   125 mg at 06/20/20 2309   ??? mirtazapine (REMERON) tablet 15 mg  15 mg Oral Nightly Herbert Moors, AGNP   15 mg at 06/20/20 2200   ??? PONATinib (ICLUSIG) tablet 30 mg **PATIENT SUPPLIED**  30 mg Oral Q24H Guerry Bruin, MD   30 mg at 06/19/20 2335   ??? predniSONE (DELTASONE) tablet 50 mg  50 mg Oral Daily Herbert Moors, AGNP   50 mg at 06/20/20 1610   ??? prochlorperazine (COMPAZINE) injection 10 mg  10 mg Intravenous Q6H PRN Guerry Bruin, MD       ??? prochlorperazine (COMPAZINE) tablet 10 mg  10 mg Oral Q6H PRN Guerry Bruin, MD       ??? sodium chloride (NS) 0.9 % flush 10 mL  10 mL Intravenous BID Herbert Moors, AGNP       ??? sodium chloride (NS) 0.9 % flush 10 mL  10 mL Intravenous BID Herbert Moors, AGNP       ??? sodium chloride (NS) 0.9 % flush 10 mL  10 mL Intravenous BID Herbert Moors, AGNP   10 mL at 06/19/20 2326   ??? sodium chloride (NS) 0.9 % infusion  20 mL/hr Intravenous Continuous Herbert Moors, AGNP       ??? sodium chloride (NS) 0.9 % infusion  20 mL/hr Intravenous Continuous PRN Guerry Bruin, MD       ??? sodium chloride 0.9% (NS) bolus 1,000 mL  1,000 mL Intravenous Daily PRN Guerry Bruin, MD       ??? sulfamethoxazole-trimethoprim (BACTRIM DS) 800-160 mg tablet 160 mg of trimethoprim  1 tablet Oral BID Herbert Moors, AGNP   160 mg of trimethoprim at 06/20/20 2200   ??? valACYclovir (VALTREX) tablet 500 mg  500 mg Oral Daily Herbert Moors, AGNP   500 mg at 06/20/20 9604       Physical Exam:  General: Resting, in no apparent distress, sitting up in bed   HEENT:  PER. No scleral icterus or conjunctival injection. MMM. Bilateral temporal edema, soft, non-tender upon palpation. No bruits heard upon auscultation.   Heart:  RRR. S1, S2. No murmurs, gallops, or rubs.  Lungs:  Breathing is unlabored, and patient is speaking full sentences with ease. CTAB.   Abdomen:  Nontender, nondistended. BS normoactive x 4. No palpable masses.  Skin:  No rashes, petechiae or purpura.  No areas of skin breakdown. Warm to touch, dry, smooth, and even.  Musculoskeletal:  No grossly-evident joint effusions or deformities.    Psychiatric:  Range of affect is appropriate.    Neurologic:  Alert and oriented to person, place, time and situation. CNII-CNXII grossly intact. ICE 10/10.   Extremities:  Appear well-perfused. No clubbing, edema, or cyanosis.  CVAD: R CW Port - no erythema, nontender; dressing CDI.      Labs:  Recent Labs     06/19/20  1000 06/20/20  0646 06/20/20  0647 06/21/20  0054  WBC 16.9* 15.5*  --  19.2*   NEUTROABS 12.7* 14.7*  --  17.4*   LYMPHSABS 2.3 0.2*  --  0.3*   HGB 8.2* 8.9*  --  8.3*   HCT 23.3* 25.8*  --  24.0*   PLT 294 248  --  222   CREATININE 2.02*  --  2.18* - 2.18* 1.94*   BUN 28*  --  31* - 31* 23   BILITOT 0.3  --  0.3 - 0.3 0.3   BILIDIR <0.10  --  <0.10 <0.10   AST 19  --  16 - 16 17   ALT 16  --  15 - 15 12   ALKPHOS 81  --  82 - 82 86   K 3.4  --  4.3 - 4.3 3.7   MG 1.2*  --  1.7 1.6   CALCIUM 8.2*  --  8.7 - 8.7 8.6*   NA 139  --  140 - 140 141   CL 105  --  108* - 108* 110*   CO2 22.0  --  21.0 - 21.0 22.0   PHOS 4.7  --  4.4 1.9*   CRP <4.0  --  <4.0 15.0*       Imaging:  No new.    Dionne Ano Collier Monica, PA-C  06/21/2020  8:03 AM   Physician Assistant   Hematology/Oncology Department   [facility]   Group pager: [phone]

## 2020-06-22 LAB — BASIC METABOLIC PANEL
ANION GAP: 9 mmol/L (ref 5–14)
BLOOD UREA NITROGEN: 26 mg/dL — ABNORMAL HIGH (ref 9–23)
BUN / CREAT RATIO: 14
CALCIUM: 8.4 mg/dL — ABNORMAL LOW (ref 8.7–10.4)
CHLORIDE: 112 mmol/L — ABNORMAL HIGH (ref 98–107)
CO2: 22 mmol/L (ref 20.0–31.0)
CREATININE: 1.84 mg/dL — ABNORMAL HIGH
EGFR CKD-EPI AA MALE: 52 mL/min/{1.73_m2} — ABNORMAL LOW (ref >=60)
EGFR CKD-EPI NON-AA MALE: 45 mL/min/{1.73_m2} — ABNORMAL LOW (ref >=60)
GLUCOSE RANDOM: 135 mg/dL (ref 70–179)
POTASSIUM: 3.8 mmol/L (ref 3.4–4.8)
SODIUM: 143 mmol/L (ref 135–145)

## 2020-06-22 LAB — HIGH SENSITIVITY TROPONIN I - SERIAL: HIGH SENSITIVITY TROPONIN I: 14 ng/L (ref <=53)

## 2020-06-22 LAB — HIGH SENSITIVITY TROPONIN I - 4 HOUR SERIAL
HIGH SENSITIVITY TROPONIN - DELTA (2-6H): 1 ng/L (ref <=7)
HIGH-SENSITIVITY TROPONIN I - 6 HOUR: 13 ng/L (ref <=53)

## 2020-06-22 LAB — CBC W/ AUTO DIFF
BASOPHILS ABSOLUTE COUNT: 0.1 10*9/L (ref 0.0–0.1)
BASOPHILS RELATIVE PERCENT: 0.4 %
EOSINOPHILS ABSOLUTE COUNT: 0 10*9/L (ref 0.0–0.5)
EOSINOPHILS RELATIVE PERCENT: 0 %
HEMATOCRIT: 23.9 % — ABNORMAL LOW (ref 39.0–48.0)
HEMOGLOBIN: 8 g/dL — ABNORMAL LOW (ref 12.9–16.5)
LYMPHOCYTES ABSOLUTE COUNT: 0.6 10*9/L — ABNORMAL LOW (ref 1.1–3.6)
LYMPHOCYTES RELATIVE PERCENT: 2.7 %
MEAN CORPUSCULAR HEMOGLOBIN CONC: 33.5 g/dL (ref 32.0–36.0)
MEAN CORPUSCULAR HEMOGLOBIN: 30.7 pg (ref 25.9–32.4)
MEAN CORPUSCULAR VOLUME: 91.8 fL (ref 77.6–95.7)
MEAN PLATELET VOLUME: 7.4 fL (ref 6.8–10.7)
MONOCYTES ABSOLUTE COUNT: 1.7 10*9/L — ABNORMAL HIGH (ref 0.3–0.8)
MONOCYTES RELATIVE PERCENT: 7.2 %
NEUTROPHILS ABSOLUTE COUNT: 20.5 10*9/L — ABNORMAL HIGH (ref 1.8–7.8)
NEUTROPHILS RELATIVE PERCENT: 89.7 %
PLATELET COUNT: 217 10*9/L (ref 150–450)
RED BLOOD CELL COUNT: 2.6 10*12/L — ABNORMAL LOW (ref 4.26–5.60)
RED CELL DISTRIBUTION WIDTH: 19.5 % — ABNORMAL HIGH (ref 12.2–15.2)
WBC ADJUSTED: 22.9 10*9/L — ABNORMAL HIGH (ref 3.6–11.2)

## 2020-06-22 LAB — HEPATIC FUNCTION PANEL
ALBUMIN: 3 g/dL — ABNORMAL LOW (ref 3.4–5.0)
ALKALINE PHOSPHATASE: 79 U/L (ref 46–116)
ALT (SGPT): 20 U/L (ref 10–49)
AST (SGOT): 18 U/L (ref <=34)
BILIRUBIN DIRECT: <0.1 mg/dL (ref 0.00–0.30)
BILIRUBIN TOTAL: 0.3 mg/dL (ref 0.3–1.2)
PROTEIN TOTAL: 5.5 g/dL — ABNORMAL LOW (ref 5.7–8.2)

## 2020-06-22 LAB — MAGNESIUM: MAGNESIUM: 1.6 mg/dL (ref 1.6–2.6)

## 2020-06-22 LAB — PHOSPHORUS: PHOSPHORUS: 2.9 mg/dL (ref 2.4–5.1)

## 2020-06-22 LAB — HIGH SENSITIVITY TROPONIN I - 2H/6H SERIAL
HIGH SENSITIVITY TROPONIN - DELTA (0-2H): 0 ng/L (ref <=7)
HIGH SENSITIVITY TROPONIN - DELTA (0-2H): 0 ng/L (ref <=7)
HIGH-SENSITIVITY TROPONIN I - 2 HOUR: 14 ng/L (ref <=53)
HIGH-SENSITIVITY TROPONIN I - 2 HOUR: 14 ng/L (ref <=53)

## 2020-06-22 LAB — C-REACTIVE PROTEIN: C-REACTIVE PROTEIN: 5 mg/L (ref <=10.0)

## 2020-06-22 MED ADMIN — HYDROmorphone (PF) (DILAUDID) injection 0.5 mg: 0.5 mg | INTRAVENOUS | @ 05:00:00 | Stop: 2020-06-22

## 2020-06-22 MED ADMIN — HYDROmorphone (PF) (DILAUDID) injection 1 mg: 1 mg | INTRAVENOUS | @ 07:00:00 | Stop: 2020-06-22

## 2020-06-22 MED ADMIN — clonazePAM (KlonoPIN) tablet 0.5 mg: .5 mg | ORAL | @ 23:00:00 | Stop: 2020-06-22

## 2020-06-22 MED ADMIN — amLODIPine (NORVASC) tablet 10 mg: 10 mg | ORAL | @ 13:00:00

## 2020-06-22 MED ADMIN — blinatumomab (BLINCYTO) 9 mcg/day in sodium chloride 0.9% NON-PVC 0.9 % IVPB (24-HR INFUSION): 9 ug/d | INTRAVENOUS | @ 19:00:00 | Stop: 2020-06-25

## 2020-06-22 MED ADMIN — sulfamethoxazole-trimethoprim (BACTRIM DS) 800-160 mg tablet 160 mg of trimethoprim: 1 | ORAL | Stop: 2020-07-25

## 2020-06-22 MED ADMIN — LORazepam (ATIVAN) tablet 0.5 mg: .5 mg | ORAL | @ 02:00:00 | Stop: 2020-06-21

## 2020-06-22 MED ADMIN — dexamethasone (DECADRON) 4 mg/mL injection 16 mg: 16 mg | INTRAVENOUS | @ 10:00:00 | Stop: 2020-06-22

## 2020-06-22 MED ADMIN — diphenhydrAMINE (BENADRYL) injection 25 mg: 25 mg | INTRAVENOUS | @ 05:00:00

## 2020-06-22 MED ADMIN — mirtazapine (REMERON) tablet 15 mg: 15 mg | ORAL

## 2020-06-22 MED ADMIN — LORazepam (ATIVAN) injection 1 mg: 1 mg | INTRAVENOUS | Stop: 2020-06-22

## 2020-06-22 MED ADMIN — carvediloL (COREG) tablet 12.5 mg: 12.5 mg | ORAL

## 2020-06-22 MED ADMIN — HYDROmorphone (DILAUDID) tablet 2 mg: 2 mg | ORAL | @ 06:00:00 | Stop: 2020-06-22

## 2020-06-22 MED ADMIN — PONATinib (ICLUSIG) tablet 30 mg **PATIENT SUPPLIED**: 30 mg | ORAL | @ 23:00:00

## 2020-06-22 MED ADMIN — predniSONE (DELTASONE) tablet 50 mg: 50 mg | ORAL | @ 13:00:00

## 2020-06-22 MED ADMIN — dexamethasone (DECADRON) 4 mg/mL injection 4 mg: 4 mg | INTRAVENOUS | @ 19:00:00 | Stop: 2020-06-22

## 2020-06-22 MED ADMIN — hydrOXYzine (ATARAX) tablet 25 mg: 25 mg | ORAL | @ 05:00:00

## 2020-06-22 MED ADMIN — clonazePAM (KlonoPIN) tablet 0.5 mg: .5 mg | ORAL | @ 17:00:00

## 2020-06-22 MED ADMIN — methylPREDNISolone sodium succinate (PF) (Solu-MEDROL) injection 125 mg: 125 mg | INTRAVENOUS | @ 05:00:00

## 2020-06-22 MED ADMIN — HYDROmorphone (DILAUDID) injection 1 mg: 1 mg | INTRAVENOUS | @ 19:00:00 | Stop: 2020-07-06

## 2020-06-22 MED ADMIN — HYDROmorphone (DILAUDID) tablet 2 mg: 2 mg | ORAL | @ 11:00:00 | Stop: 2020-06-22

## 2020-06-22 MED ADMIN — valACYclovir (VALTREX) tablet 500 mg: 500 mg | ORAL | @ 13:00:00

## 2020-06-22 MED ADMIN — carvediloL (COREG) tablet 12.5 mg: 12.5 mg | ORAL | @ 13:00:00

## 2020-06-22 MED ADMIN — famotidine (PF) (PEPCID) injection 20 mg: 20 mg | INTRAVENOUS | @ 05:00:00

## 2020-06-22 MED ADMIN — HYDROmorphone (DILAUDID) injection 1 mg: 1 mg | INTRAVENOUS | Stop: 2020-07-06

## 2020-06-22 NOTE — Unmapped (Signed)
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    Plan Summary: C1D4 Blinatumomab = 3/21. Drug stopped overnight for Grade 2 reaction. Patient with CP, skin burning/itching and anxiety. Given this is his 2nd reaction, will redose Decadron, then change Blinatumomab dosing to 9 mcg/day x 3 days, then increase back to 28 mcg on D4. Will also start scheduled and PRN Klonopin for anxiety and IV Dilaudid for skin pain. Continue Valtrex, Bactrim ppx. Continue Ponatinib. Continue Prednisone taper for DRESS, currently at 50 mg. Continue Amlodipine and Coreg daily. Further supportive care and chronic meds as noted below.     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 The course was complicated by septic shock, candida krusei fungemia, MRSA bacteremia with septic emboli c/b acute renal failure and respiratory distress requiring dialysis and intubation. ??Post-induction bmbx (day 29) demonstrated flow-based??MRD-negative remission but low-level BCR-ABL persisted. Ultimately due to CKD, performance status, COVID infection and significant complications noted above cycle 2 of HyperCVAD was delayed and ultimately differed in favor of Blinatumomab. Last Bone marrow biopsy on 1/18 showed a normocellular marrow with less than 1% Blasts with 0.036% ratio for BCR/ABL.    - Valtrex, Bactrim ppx   - Ponatinib 30 mg daily   ??  Primary Oncologist: [name]  Chemotherapy: Blinatumomab (for MRD)  Cycle: 1 D1 = 3/18@ 2100, reaction x 2, plan as below started on 3/21  - Blinatumomab 9 mcg/day x 3 days (as inpatient)  - Blinatumomab 28 mcg/day x 2 days (as inpatient), then remainder of 28 day cycle outpatient.  - Plan to move timing of dose 6 to convenient time for home health bag changes  - Dexamethasone 20 mg day 1 and 4    [ ]  Assess for neuro toxicity - ICE score daily, include handwriting  [ ]  Assess for cytokine release syndrome  ??  Toxicities  - Frequent toxicities - pyrexia (60%), headache (34%), febrile neutropenia (28%), peripheral edema (26%), nausea (24%), hypokalemia (24%), constipation (21%), anemia (20%)  - 52% neurologic toxicity (mostly grade 1-2)  ??  Disposition   - Day prior to discharge, ensure nursing has instructions for discharge process for blinatumomab   - Bag changes at [facility] infusion due 3/21@1300  - will need to be changed pending tolerability of blinatumomab  - twice weekly lab checks, and CVAD dressing changes.   [ ]  F/U appointment in clinic after cycle +/- bone marrow biopsy     Reaction Log:   - 3/19 PM, Grade 1 reaction with upper torso erythema and temporal edema. Given complicated reaction with temporal edema, drug stopped. Patient was medicated with reaction meds. Drug restarted 3/20 after re-dosing with Dexamethasone.   - 3/20 PM, Grade 2 reaction with chest pain, anxiety, and generalized skin pain and itching. Drug stopped. On 3/21, dose reduced to 9 mcg as detailed above, with Dexamethasone pre-med.  ??  AKI to CKD: due to sepsis. Now s/p dialysis with stable Cr appears to have developed CKD due to incompletely healed acute injury.  - Dose medicines for creatinine clearance of approximately 30-35  ??  DRESS: ??Followed by Dr. Caryn Section with [facility]. S/p punch biopsy  on 3/11 and will need sutures removed 14 days after placement (3/25). He will continue prednisone taper and PRN hydroxyzine. Timing of onset occurred proximate to time when dose of dasatinib was being adjusted, posaconazole given and sotrovimab. ??Likely one of these changes was responsible for the DRESS.   [ ]  Sutures will need to be removed ~3/25   - Prednisone taper 50mg  daily X7 day (3/18-3/25) then taper by 10mg  daily each week  ??  History of Dyspnea, PND, edema, pleural effusion:??Likely due to dasatinib. Monitor after therapy change.  - No current symptoms of SOB  ??  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 Patient reports hypersensitivity of left hearing and difficulty with complex sounds and multiple conversations  - AVOID Lasix/loop diuretics, other ototoxic medications  ??  HTN: Chronic condition since diagnosis. Home regimen includes Amlodipine and Coreg.   - Amlodipine daily   - Coreg daily     Anxiety/Depression: Chronic condition. Was previously on scheduled Ativan. No longer taking. On Remoron 15 mg QHS. Anxiety worsened significantly since Blinatumomab initiation.  - Klonopin 0.5 mg PO BID scheduled and daily PRN  - Continue Remeron 15 mg  QHS  ??  Chronic Back Pain: Complicated by prolonged hospital stays and procedures.   Skin pain/itching: Began with Blinatumomab initiation. Has been more responsive to IV pain meds than PO.  - Dilaudid 0.5-1 mg IV Q4HPRN for mod-severe pain  - transition to PO as able  - Atarax for itching as above  ??  History of Candidemia: Per [name] and Dr [name]??they feel that the risk of recurrent fungal infection is low if we can avoid neutropenia. No antifungal ppx at this time.  ??  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 However patient with RRT called overnight for drug reaction to Blinatumomab. Patient with CP, skin pain/itching. Cardiac w/u negative, but given Grade 2 reaction, drug stopped. See significant event note for further details. This morning pt remains visibly anxious, CP has resovled, still having some skin pain/itching although rash has resolved.    10 point ROS otherwise negative except as above in the HPI.     Objective:     Vital signs in last 24 hours:  Temp:  [36.1 ??C (97 ??F)-36.7 ??C (98.1 ??F)] 36.6 ??C (97.8 ??F)  Heart Rate:  [71-102] 102  Resp:  [16-20] 18  BP: (135-177)/(67-86) 148/72  MAP (mmHg):  [97-123] 103  SpO2:  [97 %-100 %] 100 %    Intake/Output last 3 shifts:  No intake/output data recorded.    Meds:  Current Facility-Administered Medications   Medication Dose Route Frequency Provider Last Rate Last Admin   ??? amLODIPine (NORVASC) tablet 10 mg  10 mg Oral Daily Herbert Moors, AGNP   10 mg at 06/22/20 1610   ??? [START ON 06/25/2020] blinatumomab (BLINCYTO) 28 mcg/day in sodium chloride 0.9% NON-PVC 0.9 % IVPB (24-HR INFUSION)  28 mcg/day Intravenous over 24 hr Providence Crosby Dittus, MD       ??? blinatumomab (BLINCYTO) 9 mcg/day in sodium  chloride 0.9% NON-PVC 0.9 % IVPB (24-HR INFUSION)  9 mcg/day Intravenous over 24 hr Nita Sells, MD       ??? carvediloL (COREG) tablet 12.5 mg  12.5 mg Oral BID Herbert Moors, AGNP   12.5 mg at 06/22/20 6789   ??? CHEMO CLARIFICATION ORDER   Other Continuous PRN [place], PA       ??? CHEMO CLARIFICATION ORDER   Other Continuous PRN [place] Ezekiel Menzer, AGNP       ??? clonazePAM (KlonoPIN) tablet 0.5 mg  0.5 mg Oral BID [facility], AGNP   0.5 mg at 06/22/20 1313   ??? clonazePAM (KlonoPIN) tablet 0.5 mg  0.5 mg Oral Daily PRN [place] [name], AGNP       ??? [START ON 06/25/2020] dexamethasone (DECADRON) 4 mg/mL injection 16 mg  16 mg Intravenous Once [place] Crosby Dittus, MD       ??? dexamethasone (DECADRON) 4 mg/mL injection 4 mg  4 mg Intravenous Once [place] Crosby Dittus, MD       ??? diphenhydrAMINE (BENADRYL) injection 25 mg  25 mg Intravenous Q4H PRN Guerry Bruin, MD   25 mg at 06/22/20 0104   ??? EPINEPHrine (EPIPEN) injection 0.3 mg  0.3 mg Intramuscular Daily PRN Guerry Bruin, MD       ??? famotidine (PF) (PEPCID) injection 20 mg  20 mg Intravenous Q4H PRN Guerry Bruin, MD   20 mg at 06/22/20 0104   ??? HYDROmorphone (DILAUDID) injection 0.5 mg  0.5 mg Intravenous Q4H PRN [facility], AGNP        Or   ??? HYDROmorphone (DILAUDID) injection 1 mg  1 mg Intravenous Q4H PRN Ernie Hew, AGNP       ??? hydrOXYzine (ATARAX) tablet 25 mg  25 mg Oral Q6H PRN Herbert Moors, AGNP   25 mg at 06/22/20 0045   ??? IP OKAY TO TREAT   Other Continuous PRN Herbert Moors, AGNP       ??? IP OKAY TO TREAT   Other Continuous PRN Ernie Hew, AGNP       ??? loperamide (IMODIUM) capsule 2 mg  2 mg Oral Q2H PRN Herbert Moors, AGNP       ??? loperamide (IMODIUM) capsule 4 mg  4 mg Oral Once PRN Herbert Moors, AGNP       ??? meperidine (DEMEROL) injection 25 mg  25 mg Intravenous Q30 Min PRN Guerry Bruin, MD       ??? methylPREDNISolone sodium succinate (PF) (Solu-MEDROL) injection 125 mg  125 mg Intravenous Q4H PRN Guerry Bruin, MD   125 mg at 06/22/20 0104   ??? mirtazapine (REMERON) tablet 15 mg  15 mg Oral Nightly Herbert Moors, AGNP   15 mg at 06/21/20 2006   ??? PONATinib (ICLUSIG) tablet 30 mg **PATIENT SUPPLIED**  30 mg Oral Q24H Guerry Bruin, MD   30 mg at 06/21/20 1843   ??? predniSONE (DELTASONE) tablet 50 mg  50 mg Oral Daily Herbert Moors, AGNP   50 mg at 06/22/20 3810   ??? prochlorperazine (COMPAZINE) injection 10 mg  10 mg Intravenous Q6H PRN Guerry Bruin, MD       ??? prochlorperazine (COMPAZINE) tablet 10 mg  10 mg Oral Q6H PRN Guerry Bruin, MD       ??? sodium chloride (NS) 0.9 % infusion  20 mL/hr Intravenous Continuous Herbert Moors, AGNP       ???  sodium chloride (NS) 0.9 % infusion  20 mL/hr Intravenous Continuous PRN Guerry Bruin, MD       ??? sodium chloride 0.9% (NS) bolus 1,000 mL  1,000 mL Intravenous Daily PRN Guerry Bruin, MD       ??? sulfamethoxazole-trimethoprim (BACTRIM DS) 800-160 mg tablet 160 mg of trimethoprim  1 tablet Oral BID Herbert Moors, AGNP   160 mg of trimethoprim at 06/21/20 2006   ??? valACYclovir (VALTREX) tablet 500 mg  500 mg Oral Daily Herbert Moors, AGNP   500 mg at 06/22/20 2841       Physical Exam:  General: Anxious, in no acute distress, OOB to sofa.  HEENT:  PER. No scleral icterus or conjunctival injection. MMM.   Heart:  RRR. S1, S2. No murmurs, gallops, or rubs.  Lungs:  Breathing is unlabored, and patient is speaking full sentences with ease. CTAB.   Abdomen:  Nontender, nondistended. BS normoactive. No palpable masses.  Skin:  No rashes, petechiae or purpura.  No areas of skin breakdown. Warm to touch, dry, smooth, and even.  Musculoskeletal:  No grossly-evident joint effusions or deformities.    Psychiatric:  Range of affect is appropriate.    Neurologic:  Alert and oriented to person, place, time and situation. CNII-CNXII grossly intact. ICE 10/10.   Extremities:  Appear well-perfused. No clubbing, pitting edema, or cyanosis.  CVAD: R CW Port - no erythema, nontender; dressing CDI.      Labs:  Recent Labs     06/20/20  0646 06/20/20  0647 06/21/20  0054 06/22/20  0239 06/22/20  0240   WBC 15.5*  --  19.2*  --  22.9*   NEUTROABS 14.7*  --  17.4*  --  20.5*   LYMPHSABS 0.2*  --  0.3*  --  0.6*   HGB 8.9*  --  8.3*  --  8.0*   HCT 25.8*  --  24.0*  --  23.9*   PLT 248  --  222  --  217   CREATININE  --  2.18* - 2.18* 1.94* 1.84*  --    BUN  --  31* - 31* 23 26*  --    BILITOT  --  0.3 - 0.3 0.3 0.3  --    BILIDIR  --  <0.10 <0.10 <0.10  --    AST  --  16 - 16 17 18   --    ALT  --  15 - 15 12 20   --    ALKPHOS  --  82 - 82 86 79  --    K  --  4.3 - 4.3 3.7 3.8  --    MG  --  1.7 1.6 1.6  --    CALCIUM  --  8.7 - 8.7 8.6* 8.4*  --    NA  --  140 - 140 141 143  --    CL  --  108* - 108* 110* 112*  --    CO2  --  21.0 - 21.0 22.0 22.0  --    PHOS  --  4.4 1.9* 2.9  --    CRP  --  <4.0 15.0* 5.0  --        Imaging:  No new.    Glendell Docker, NP  Hematology/Oncology  Group pager: [phone]    06/22/20

## 2020-06-22 NOTE — Unmapped (Addendum)
Patient with Grade 2 Hypersensitivity to Blina overnight (chest pain and skin rash). Described as heaviness on his chest. Exam was benign (Cardiac RRR and Lungs CTAB) with mild redness of anterior torso/arms that was less severe then last night. However the chest heaviness was new and states it doesn't feel like prior episodes. Cardiac workup unremarkable (CXR, ECG, trop). Vitals were stable.     A/P: Grade 2 Hypersensitivity to Blina. (Does not clearly fit into CRS algorithm but can be considered grade 1)  - Pepcid, Benadryl, Solumedrol per standing orders  - Dilaudid 0.5 IV for significant skin pain, will reassess and can try PO if still refractory  - Cardiac workup unremarkable  - Will attempt to restart infusion if symptoms resolve within 4 hour mark at 1/2 speed per discussion with attending.    Case was discussed with Dr. Ezzie Dural PA-C  Physician Assistant  Hematology/Oncology  [facility]     Symptoms did not resolve until >4 hours later. Page was sent to fellow and will attempt to restart at 1/2 rate. They will place order for dexamethasone 20mg  IV 30 minutes prior to infusion along with chemo clarification order.

## 2020-06-22 NOTE — Unmapped (Signed)
Grade 2 hypersensitivity reaction to blina overnight with c/o skin burning and chest pain; also endorsing significant anxiety. VSS and consistent with baseline throughout. Cardiac workup, CXR, and emergency meds completed. Blina held for about six hours so dex re-ordered and infusion rate reduced by 50% per malignant heme fellow. Given pain, anxiety, and pruritis prns (see MAR). Will CTM closely.     Problem: Adult Inpatient Plan of Care  Goal: Plan of Care Review  Outcome: Not Progressing  Goal: Patient-Specific Goal (Individualized)  Outcome: Not Progressing  Goal: Absence of Hospital-Acquired Illness or Injury  Outcome: Not Progressing  Intervention: Identify and Manage Fall Risk  Recent Flowsheet Documentation  Taken 06/21/2020 2106 by Joya Salm, RN  Safety Interventions: fall reduction program maintained  Intervention: Prevent and Manage VTE (Venous Thromboembolism) Risk  Recent Flowsheet Documentation  Taken 06/21/2020 2106 by Joya Salm, RN  Activity Management: activity adjusted per tolerance  Intervention: Prevent Infection  Recent Flowsheet Documentation  Taken 06/21/2020 2106 by Joya Salm, RN  Infection Prevention: cohorting utilized  Goal: Optimal Comfort and Wellbeing  Outcome: Not Progressing  Goal: Readiness for Transition of Care  Outcome: Not Progressing  Goal: Rounds/Family Conference  Outcome: Not Progressing

## 2020-06-23 ENCOUNTER — Ambulatory Visit
Admit: 2020-06-19 | Payer: MEDICAID | Attending: Rehabilitative and Restorative Service Providers" | Primary: Rehabilitative and Restorative Service Providers"

## 2020-06-23 ENCOUNTER — Ambulatory Visit
Admit: 2020-06-23 | Payer: MEDICAID | Attending: Rehabilitative and Restorative Service Providers" | Primary: Rehabilitative and Restorative Service Providers"

## 2020-06-23 LAB — BASIC METABOLIC PANEL
ANION GAP: 9 mmol/L (ref 5–14)
BLOOD UREA NITROGEN: 33 mg/dL — ABNORMAL HIGH (ref 9–23)
BUN / CREAT RATIO: 17
CALCIUM: 8.6 mg/dL — ABNORMAL LOW (ref 8.7–10.4)
CHLORIDE: 108 mmol/L — ABNORMAL HIGH (ref 98–107)
CO2: 24 mmol/L (ref 20.0–31.0)
CREATININE: 1.89 mg/dL — ABNORMAL HIGH
EGFR CKD-EPI AA MALE: 50 mL/min/{1.73_m2} — ABNORMAL LOW (ref >=60)
EGFR CKD-EPI NON-AA MALE: 43 mL/min/{1.73_m2} — ABNORMAL LOW (ref >=60)
GLUCOSE RANDOM: 113 mg/dL (ref 70–179)
POTASSIUM: 4.3 mmol/L (ref 3.4–4.8)
SODIUM: 141 mmol/L (ref 135–145)

## 2020-06-23 LAB — CBC W/ AUTO DIFF
BASOPHILS ABSOLUTE COUNT: 0 10*9/L (ref 0.0–0.1)
BASOPHILS RELATIVE PERCENT: 0.1 %
EOSINOPHILS ABSOLUTE COUNT: 0 10*9/L (ref 0.0–0.5)
EOSINOPHILS RELATIVE PERCENT: 0 %
HEMATOCRIT: 25.5 % — ABNORMAL LOW (ref 39.0–48.0)
HEMOGLOBIN: 8.7 g/dL — ABNORMAL LOW (ref 12.9–16.5)
LYMPHOCYTES ABSOLUTE COUNT: 0.9 10*9/L — ABNORMAL LOW (ref 1.1–3.6)
LYMPHOCYTES RELATIVE PERCENT: 3.4 %
MEAN CORPUSCULAR HEMOGLOBIN CONC: 34.2 g/dL (ref 32.0–36.0)
MEAN CORPUSCULAR HEMOGLOBIN: 31.3 pg (ref 25.9–32.4)
MEAN CORPUSCULAR VOLUME: 91.5 fL (ref 77.6–95.7)
MEAN PLATELET VOLUME: 7.6 fL (ref 6.8–10.7)
MONOCYTES ABSOLUTE COUNT: 1.8 10*9/L — ABNORMAL HIGH (ref 0.3–0.8)
MONOCYTES RELATIVE PERCENT: 6.8 %
NEUTROPHILS ABSOLUTE COUNT: 23.9 10*9/L — ABNORMAL HIGH (ref 1.8–7.8)
NEUTROPHILS RELATIVE PERCENT: 89.7 %
PLATELET COUNT: 198 10*9/L (ref 150–450)
RED BLOOD CELL COUNT: 2.79 10*12/L — ABNORMAL LOW (ref 4.26–5.60)
RED CELL DISTRIBUTION WIDTH: 19.1 % — ABNORMAL HIGH (ref 12.2–15.2)
WBC ADJUSTED: 26.7 10*9/L — ABNORMAL HIGH (ref 3.6–11.2)

## 2020-06-23 LAB — HEPATIC FUNCTION PANEL
ALBUMIN: 3 g/dL — ABNORMAL LOW (ref 3.4–5.0)
ALKALINE PHOSPHATASE: 65 U/L (ref 46–116)
ALT (SGPT): 15 U/L (ref 10–49)
AST (SGOT): 15 U/L (ref <=34)
BILIRUBIN DIRECT: <0.1 mg/dL (ref 0.00–0.30)
BILIRUBIN TOTAL: 0.3 mg/dL (ref 0.3–1.2)
PROTEIN TOTAL: 5.5 g/dL — ABNORMAL LOW (ref 5.7–8.2)

## 2020-06-23 LAB — C-REACTIVE PROTEIN: C-REACTIVE PROTEIN: <4 mg/L (ref <=10.0)

## 2020-06-23 LAB — MAGNESIUM: MAGNESIUM: 1.6 mg/dL (ref 1.6–2.6)

## 2020-06-23 LAB — PHOSPHORUS: PHOSPHORUS: 4.7 mg/dL (ref 2.4–5.1)

## 2020-06-23 MED ADMIN — carvediloL (COREG) tablet 12.5 mg: 12.5 mg | ORAL | @ 01:00:00

## 2020-06-23 MED ADMIN — PONATinib (ICLUSIG) tablet 30 mg **PATIENT SUPPLIED**: 30 mg | ORAL | @ 23:00:00

## 2020-06-23 MED ADMIN — clonazePAM (KlonoPIN) tablet 0.5 mg: .5 mg | ORAL | @ 01:00:00

## 2020-06-23 MED ADMIN — carvediloL (COREG) tablet 12.5 mg: 12.5 mg | ORAL | @ 12:00:00

## 2020-06-23 MED ADMIN — HYDROmorphone (DILAUDID) injection 1 mg: 1 mg | INTRAVENOUS | @ 21:00:00 | Stop: 2020-06-23

## 2020-06-23 MED ADMIN — LORazepam (ATIVAN) injection 1 mg: 1 mg | INTRAVENOUS | @ 23:00:00 | Stop: 2020-06-23

## 2020-06-23 MED ADMIN — mirtazapine (REMERON) tablet 15 mg: 15 mg | ORAL | @ 01:00:00

## 2020-06-23 MED ADMIN — hydrOXYzine (ATARAX) tablet 25 mg: 25 mg | ORAL | @ 15:00:00

## 2020-06-23 MED ADMIN — predniSONE (DELTASONE) tablet 50 mg: 50 mg | ORAL | @ 12:00:00

## 2020-06-23 MED ADMIN — amLODIPine (NORVASC) tablet 10 mg: 10 mg | ORAL | @ 12:00:00

## 2020-06-23 MED ADMIN — HYDROmorphone (DILAUDID) injection 1 mg: 1 mg | INTRAVENOUS | @ 19:00:00 | Stop: 2020-07-06

## 2020-06-23 MED ADMIN — hydrOXYzine (ATARAX) tablet 25 mg: 25 mg | ORAL | @ 23:00:00

## 2020-06-23 MED ADMIN — HYDROmorphone (DILAUDID) injection 1 mg: 1 mg | INTRAVENOUS | @ 12:00:00 | Stop: 2020-07-06

## 2020-06-23 MED ADMIN — clonazePAM (KlonoPIN) tablet 0.5 mg: .5 mg | ORAL | @ 12:00:00

## 2020-06-23 MED ADMIN — clonazePAM (KlonoPIN) tablet 1 mg: 1 mg | ORAL | @ 15:00:00

## 2020-06-23 MED ADMIN — blinatumomab (BLINCYTO) 9 mcg/day in sodium chloride 0.9% NON-PVC 0.9 % IVPB (24-HR INFUSION): 9 ug/d | INTRAVENOUS | @ 19:00:00 | Stop: 2020-06-25

## 2020-06-23 MED ADMIN — valACYclovir (VALTREX) tablet 500 mg: 500 mg | ORAL | @ 12:00:00

## 2020-06-23 MED ADMIN — HYDROmorphone (DILAUDID) injection 1 mg: 1 mg | INTRAVENOUS | @ 07:00:00 | Stop: 2020-07-06

## 2020-06-23 MED ADMIN — clonazePAM (KlonoPIN) tablet 1 mg: 1 mg | ORAL | @ 21:00:00

## 2020-06-23 MED ADMIN — HYDROmorphone (DILAUDID) injection 1 mg: 1 mg | INTRAVENOUS | Stop: 2020-06-23

## 2020-06-23 NOTE — Unmapped (Signed)
Vitals within baseline. Pt alert and oriented x4. No acute events this shift. Blinatumomab restarted. Pt tolerating well, although very anxious. Scheduled and PRN Klonopin given. Awaiting results of PRN response. Provider notified. Pt remained free of falls and injuries. Will continue to monitor.

## 2020-06-23 NOTE — Unmapped (Signed)
VSS with no falls or injuries. Patient continues to endorse sensation of burning beneath skin and anxiety. Given ativan x1 and dilaudid x2. Continuing blina. Able to sleep overnight. Wife at bedside. Will CTM.     Problem: Adult Inpatient Plan of Care  Goal: Plan of Care Review  Outcome: Progressing  Goal: Patient-Specific Goal (Individualized)  Outcome: Progressing  Goal: Absence of Hospital-Acquired Illness or Injury  Outcome: Progressing  Intervention: Identify and Manage Fall Risk  Recent Flowsheet Documentation  Taken 06/22/2020 2100 by Joya Salm, RN  Safety Interventions:   chemotherapeutic agent precautions   family at bedside   lighting adjusted for tasks/safety   low bed   nonskid shoes/slippers when out of bed  Intervention: Prevent and Manage VTE (Venous Thromboembolism) Risk  Recent Flowsheet Documentation  Taken 06/22/2020 2100 by Joya Salm, RN  Activity Management: up ad lib  Goal: Optimal Comfort and Wellbeing  Outcome: Progressing  Goal: Readiness for Transition of Care  Outcome: Progressing  Goal: Rounds/Family Conference  Outcome: Progressing

## 2020-06-23 NOTE — Unmapped (Signed)
[facility]  [address] [place], [place] [address]    [phone]    [name] did not show for his scheduled Physical Therapy follow-up session. Per chart review, the patient is currently hospitalized.  Please contact me if you have any questions or concerns.    Thank you for this referral,     Signed: Eulas Post, PT  06/23/2020 9:35 AM

## 2020-06-23 NOTE — Unmapped (Signed)
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    Plan Summary: C1D2=3/22 at 1500 of Blinatumomab at 9 mcg + Ponatinib. ICANs 10 today. Plan for 9 mcg x3 day then increased to if tolerating. On Prednisone taper (see below) for DRESS, suture removal on 3/25. Continue Valtrex, Bactrim ppx. On Klonopin BID for anxiety and IV Dilaudid for skin pain. Continue Amlodipine and Coreg daily. Further supportive care and chronic meds as noted below.     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 The course was complicated by septic shock, candida krusei fungemia, MRSA bacteremia with septic emboli c/b acute renal failure and respiratory distress requiring dialysis and intubation. ??Post-induction bmbx (day 29) demonstrated flow-based??MRD-negative remission but low-level BCR-ABL persisted. Ultimately due to CKD, performance status, COVID infection and significant complications noted above cycle 2 of HyperCVAD was delayed and ultimately differed in favor of Blinatumomab. Last Bone marrow biopsy on 1/18 showed a normocellular marrow with less than 1% Blasts with 0.036% ratio for BCR/ABL.    - Valtrex, Bactrim ppx   - Ponatinib 30 mg daily   ??  Primary Oncologist: [name]  Chemotherapy: Blinatumomab (for MRD)  Cycle: 1 D1 = 3/18@ 2100, reaction x 2, plan as below started on 3/21  - Blinatumomab 9 mcg/day x 3 days (as inpatient)   - Blinatumomab 28 mcg/day x 2 days (as inpatient), then remainder of 28 day cycle outpatient.  - Plan to move timing of dose 6 to convenient time for home health bag changes  - Dexamethasone 20 mg day 1 and 4    [ ]  Assess for neuro toxicity - ICE score daily, include handwriting  [ ]  Assess for cytokine release syndrome  ??  Toxicities  - Frequent toxicities - pyrexia (60%), headache (34%), febrile neutropenia (28%), peripheral edema (26%), nausea (24%), hypokalemia (24%), constipation (21%), anemia (20%)  - 52% neurologic toxicity (mostly grade 1-2)  ??  Disposition   - Day prior to discharge, ensure nursing has instructions for discharge process for blinatumomab   - Bag changes at [facility] infusion due 3/21@1300  - will need to be changed pending tolerability of blinatumomab  - twice weekly lab checks, and CVAD dressing changes.   [ ]  F/U appointment in clinic after cycle +/- bone marrow biopsy     Reaction Log:   - 3/19 PM, Grade 1 reaction with upper torso erythema and temporal edema. Given complicated reaction with temporal edema, drug stopped. Patient was medicated with reaction meds. Drug restarted 3/20 after re-dosing with Dexamethasone.   - 3/20 PM, Grade 2 reaction with chest pain, anxiety, and generalized skin pain and itching. Drug stopped. On 3/21, dose reduced to 9 mcg as detailed above, with Dexamethasone pre-med.  - 3/21 PM - D1 of 9 mcg, plan x 3 day then increase to 28 mcg   ??  AKI to CKD: due to sepsis. Now s/p dialysis with stable Cr appears to have developed CKD due to incompletely healed acute injury.  - Dose medicines for creatinine clearance of approximately 30-35  ??  DRESS: ??Followed by Dr. Caryn Section with [facility]. S/p punch biopsy on 3/11 and will  need sutures removed 14 days after placement (3/25). He will continue prednisone taper and PRN hydroxyzine. Timing of onset occurred proximate to time when dose of dasatinib was being adjusted, posaconazole given and sotrovimab. ??Likely one of these changes was responsible for the DRESS.   [ ]  Sutures will need to be removed ~3/25   - Prednisone taper 50mg  daily X7 day (3/18-3/25) then taper by 10mg  daily each week  ??  History of Dyspnea, PND, edema, pleural effusion:??Likely due to dasatinib. Monitor after therapy change.  - No current symptoms of SOB  ??  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 Patient reports hypersensitivity of left hearing and difficulty with complex sounds and multiple conversations  - AVOID Lasix/loop diuretics, other ototoxic medications  ??  HTN: Chronic condition since diagnosis. Home regimen includes Amlodipine and Coreg.   - Amlodipine daily   - Coreg daily     Anxiety/Depression: Chronic condition. Was previously on scheduled Ativan. No longer taking. On Remoron 15 mg QHS. Anxiety worsened significantly since Blinatumomab initiation.  - Klonopin 0.5 mg PO BID scheduled and daily PRN  - Continue Remeron 15 mg  QHS  ??  Chronic Back Pain: Complicated by prolonged hospital stays and procedures.     Skin pain/itching: Began with Blinatumomab initiation. Has been more responsive to IV pain meds than PO.  - Dilaudid 0.5-1 mg IV Q4HPRN for mod-severe pain  - transition to PO as able  - Atarax for itching as above  ??  History of Candidemia: Per [name] and Dr [name]??they feel that the risk of recurrent fungal infection is low if we can avoid neutropenia. No antifungal ppx at this time.  ??  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  Immunocompromised status: Patient is immunocompromised secondary  PH+ ALL disease and chemotherapy  - Antimicrobial prophylaxis as above    Nutrition:                        Subjective:   Afebrile, NAEON. No complaints today. Requesting to leave the floor but understands the restrictions. Tired this morning.     10 point ROS otherwise negative except as above in the HPI.     Objective:     Vital signs in last 24 hours:  Temp:  [36.2 ??C (97.1 ??F)-36.6 ??C (97.9 ??F)] 36.5 ??C (97.7 ??F)  Heart Rate:  [71-89] 78  Resp:  [16-20] 20  BP: (138-166)/(69-95) 138/69  MAP (mmHg):  [98-124] 98  SpO2:  [94 %-100 %] 98 %    Intake/Output last 3 shifts:  I/O last 3 completed shifts:  In: 100 [P.O.:100]  Out: -     Meds:  Current Facility-Administered Medications   Medication Dose Route Frequency Provider Last Rate Last Admin   ??? amLODIPine (NORVASC) tablet 10 mg  10 mg Oral Daily Herbert Moors, AGNP   10 mg at 06/23/20 2440   ??? [START ON 06/25/2020] blinatumomab (BLINCYTO) 28 mcg/day in sodium chloride 0.9% NON-PVC 0.9 % IVPB (24-HR INFUSION)  28 mcg/day Intravenous over 24 hr Providence Crosby Dittus, MD       ??? blinatumomab (BLINCYTO) 9 mcg/day in sodium chloride 0.9% NON-PVC 0.9 % IVPB (24-HR INFUSION)  9 mcg/day Intravenous over 24 hr Providence Crosby Dittus, MD 10 mL/hr at 06/22/20 1524 10.375 mcg at  06/22/20 1524   ??? carvediloL (COREG) tablet 12.5 mg  12.5 mg Oral BID Herbert Moors, AGNP   12.5 mg at 06/23/20 3664   ??? CHEMO CLARIFICATION ORDER   Other Continuous PRN Court Joy [place], PA       ??? CHEMO CLARIFICATION ORDER   Other Continuous PRN Army Chaco Vogler, AGNP       ??? clonazePAM (KlonoPIN) tablet 0.5 mg  0.5 mg Oral BID [facility], AGNP   0.5 mg at 06/23/20 4034   ??? clonazePAM (KlonoPIN) tablet 1 mg  1 mg Oral BID PRN Army Chaco Vogler, AGNP       ??? [START ON 06/25/2020] dexamethasone (DECADRON) 4 mg/mL injection 16 mg  16 mg Intravenous Once Providence Crosby Dittus, MD       ??? diphenhydrAMINE (BENADRYL) injection 25 mg  25 mg Intravenous Q4H PRN Guerry Bruin, MD   25 mg at 06/22/20 0104   ??? EPINEPHrine (EPIPEN) injection 0.3 mg  0.3 mg Intramuscular Daily PRN Guerry Bruin, MD       ??? famotidine (PF) (PEPCID) injection 20 mg  20 mg Intravenous Q4H PRN Guerry Bruin, MD   20 mg at 06/22/20 0104   ??? HYDROmorphone (DILAUDID) injection 0.5 mg  0.5 mg Intravenous Q4H PRN [facility], AGNP        Or   ??? HYDROmorphone (DILAUDID) injection 1 mg  1 mg Intravenous Q4H PRN Army Chaco Vogler, AGNP   1 mg at 06/23/20 7425   ??? hydrOXYzine (ATARAX) tablet 25 mg  25 mg Oral Q6H PRN Herbert Moors, AGNP   25 mg at 06/22/20 0045   ??? IP OKAY TO TREAT   Other Continuous PRN Herbert Moors, AGNP       ??? IP OKAY TO TREAT   Other Continuous PRN Ernie Hew, AGNP       ??? loperamide (IMODIUM) capsule 2 mg  2 mg Oral Q2H PRN Herbert Moors, AGNP       ??? loperamide (IMODIUM) capsule 4 mg  4 mg Oral Once PRN Herbert Moors, AGNP       ??? meperidine (DEMEROL) injection 25 mg  25 mg Intravenous Q30 Min PRN Guerry Bruin, MD       ??? methylPREDNISolone sodium succinate (PF) (Solu-MEDROL) injection 125 mg  125 mg Intravenous Q4H PRN Guerry Bruin, MD   125 mg at 06/22/20 0104   ??? mirtazapine (REMERON) tablet 15 mg  15 mg Oral Nightly Herbert Moors, AGNP   15 mg at 06/22/20 2113   ??? PONATinib (ICLUSIG) tablet 30 mg **PATIENT SUPPLIED**  30 mg Oral Q24H Guerry Bruin, MD   30 mg at 06/22/20 1834   ??? predniSONE (DELTASONE) tablet 50 mg  50 mg Oral Daily Herbert Moors, AGNP   50 mg at 06/23/20 9563   ??? prochlorperazine (COMPAZINE) injection 10 mg  10 mg Intravenous Q6H PRN Guerry Bruin, MD       ??? prochlorperazine (COMPAZINE) tablet 10 mg  10 mg Oral Q6H PRN Guerry Bruin, MD       ??? sodium chloride (NS) 0.9 % infusion  20 mL/hr Intravenous Continuous Herbert Moors, AGNP       ??? sodium chloride (NS) 0.9 % infusion  20 mL/hr Intravenous Continuous PRN Guerry Bruin, MD       ??? sodium chloride 0.9% (NS) bolus 1,000 mL  1,000 mL Intravenous Daily PRN Molli Hazard  Pamelia Hoit, MD       ??? sulfamethoxazole-trimethoprim (BACTRIM DS) 800-160 mg tablet 160 mg of trimethoprim  1 tablet Oral BID Herbert Moors, AGNP   160 mg of trimethoprim at 06/21/20 2006   ??? valACYclovir (VALTREX) tablet 500 mg  500 mg Oral Daily Herbert Moors, AGNP   500 mg at 06/23/20 1610       Physical Exam:  General: Anxious, in no acute distress, OOB to sofa.  HEENT:  PER. No scleral icterus or conjunctival injection. MMM.   Heart:  RRR. S1, S2. No murmurs, gallops, or rubs.  Lungs:  Breathing is unlabored, and patient is speaking full sentences with ease. CTAB.   Abdomen:  Nontender, nondistended. BS normoactive. No palpable masses.  Skin:  No rashes, petechiae or purpura.  No areas of skin breakdown. Warm to touch, dry, smooth, and even.  Musculoskeletal:  No grossly-evident joint effusions or deformities.    Psychiatric:  Range of affect is appropriate.    Neurologic:  Alert and oriented to person, place, time and situation. CNII-CNXII grossly intact. ICE 10/10.   Extremities:  Appear well-perfused. No clubbing, pitting edema, or cyanosis.  CVAD: R CW Port - no erythema, nontender; dressing CDI.      Labs:  Recent Labs     06/21/20  0054 06/22/20  0239 06/22/20  0240 06/23/20  0548   WBC 19.2*  --  22.9* 26.7*   NEUTROABS 17.4*  --  20.5* 23.9*   LYMPHSABS 0.3*  --  0.6* 0.9*   HGB 8.3*  --  8.0* 8.7*   HCT 24.0*  --  23.9* 25.5*   PLT 222  --  217 198   CREATININE 1.94* 1.84*  --  1.89*   BUN 23 26*  --  33*   BILITOT 0.3 0.3  --  0.3   BILIDIR <0.10 <0.10  --  <0.10   AST 17 18  --  15   ALT 12 20  --  15   ALKPHOS 86 79  --  65   K 3.7 3.8  --  4.3   MG 1.6 1.6  --  1.6   CALCIUM 8.6* 8.4*  --  8.6*   NA 141 143  --  141   CL 110* 112*  --  108*   CO2 22.0 22.0  --  24.0   PHOS 1.9* 2.9  --  4.7   CRP 15.0* 5.0  --  <4.0       Imaging:  No new.    Irena Cords, PA-C  06/23/2020  11:14 AM   Physician Assistant   Hematology/Oncology Department   [facility]   Group Pager: [phone]     06/23/20

## 2020-06-23 NOTE — Unmapped (Signed)
VENOUS ACCESS ULTRASOUND PROCEDURE NOTE    Indications:   Poor venous access.    The Venous Access Team has assessed this patient for the placement of a PIV. Ultrasound guidance was necessary to obtain access.     Procedure Details:  Identity of the patient was confirmed via name, medical record number and date of birth. The availability of the correct equipment was verified.    The vein was identified for ultrasound catheter insertion.  Field was prepared with necessary supplies and equipment.  Probe cover and sterile gel utilized.  Insertion site was prepped with chlorhexidine solution and allowed to dry.  The catheter extension was primed with normal saline.A(n) 22g x 1.75 inch catheter was placed in the right forearm with 1 attempt(s). See LDA for additional details.    Catheter aspirated, 4 mL blood return present. The catheter was then flushed with 10 mL of normal saline. Insertion site cleansed, and dressing applied per manufacturer guidelines. The catheter was inserted without difficulty  by Jacqulyn Liner RN.     [place] RN was notified.     Thank you,     Jacqulyn Liner RN Venous Access Team   [phone]     Workup / Procedure Time:  30 minutes    See vein image below:

## 2020-06-24 LAB — BASIC METABOLIC PANEL
ANION GAP: 11 mmol/L (ref 5–14)
BLOOD UREA NITROGEN: 49 mg/dL — ABNORMAL HIGH (ref 9–23)
BUN / CREAT RATIO: 24
CALCIUM: 8.6 mg/dL — ABNORMAL LOW (ref 8.7–10.4)
CHLORIDE: 109 mmol/L — ABNORMAL HIGH (ref 98–107)
CO2: 22 mmol/L (ref 20.0–31.0)
CREATININE: 2.02 mg/dL — ABNORMAL HIGH
EGFR CKD-EPI AA MALE: 46 mL/min/{1.73_m2} — ABNORMAL LOW (ref >=60)
EGFR CKD-EPI NON-AA MALE: 40 mL/min/{1.73_m2} — ABNORMAL LOW (ref >=60)
GLUCOSE RANDOM: 84 mg/dL (ref 70–179)
POTASSIUM: 4 mmol/L (ref 3.5–5.1)
SODIUM: 142 mmol/L (ref 135–145)

## 2020-06-24 LAB — CBC W/ AUTO DIFF
BASOPHILS ABSOLUTE COUNT: 0.1 10*9/L (ref 0.0–0.1)
BASOPHILS RELATIVE PERCENT: 0.2 %
EOSINOPHILS ABSOLUTE COUNT: 0 10*9/L (ref 0.0–0.5)
EOSINOPHILS RELATIVE PERCENT: 0 %
HEMATOCRIT: 27.8 % — ABNORMAL LOW (ref 39.0–48.0)
HEMOGLOBIN: 9.4 g/dL — ABNORMAL LOW (ref 12.9–16.5)
LYMPHOCYTES ABSOLUTE COUNT: 1.8 10*9/L (ref 1.1–3.6)
LYMPHOCYTES RELATIVE PERCENT: 7.5 %
MEAN CORPUSCULAR HEMOGLOBIN CONC: 33.8 g/dL (ref 32.0–36.0)
MEAN CORPUSCULAR HEMOGLOBIN: 31.2 pg (ref 25.9–32.4)
MEAN CORPUSCULAR VOLUME: 92.4 fL (ref 77.6–95.7)
MEAN PLATELET VOLUME: 7.1 fL (ref 6.8–10.7)
MONOCYTES ABSOLUTE COUNT: 1.7 10*9/L — ABNORMAL HIGH (ref 0.3–0.8)
MONOCYTES RELATIVE PERCENT: 7.2 %
NEUTROPHILS ABSOLUTE COUNT: 20.2 10*9/L — ABNORMAL HIGH (ref 1.8–7.8)
NEUTROPHILS RELATIVE PERCENT: 85.1 %
PLATELET COUNT: 182 10*9/L (ref 150–450)
RED BLOOD CELL COUNT: 3.01 10*12/L — ABNORMAL LOW (ref 4.26–5.60)
RED CELL DISTRIBUTION WIDTH: 20 % — ABNORMAL HIGH (ref 12.2–15.2)
WBC ADJUSTED: 23.7 10*9/L — ABNORMAL HIGH (ref 3.6–11.2)

## 2020-06-24 LAB — HEPATIC FUNCTION PANEL
ALBUMIN: 3.2 g/dL — ABNORMAL LOW (ref 3.4–5.0)
ALKALINE PHOSPHATASE: 72 U/L (ref 46–116)
ALT (SGPT): 18 U/L (ref 10–49)
AST (SGOT): 15 U/L (ref <=34)
BILIRUBIN DIRECT: <0.1 mg/dL (ref 0.00–0.30)
BILIRUBIN TOTAL: 0.3 mg/dL (ref 0.3–1.2)
PROTEIN TOTAL: 5.6 g/dL — ABNORMAL LOW (ref 5.7–8.2)

## 2020-06-24 LAB — C-REACTIVE PROTEIN: C-REACTIVE PROTEIN: <4 mg/L (ref <=10.0)

## 2020-06-24 LAB — PHOSPHORUS: PHOSPHORUS: 4.5 mg/dL (ref 2.4–5.1)

## 2020-06-24 LAB — MAGNESIUM: MAGNESIUM: 1.6 mg/dL (ref 1.6–2.6)

## 2020-06-24 MED ORDER — OXYCODONE ER 30 MG TABLET,CRUSH RESISTANT,EXTENDED RELEASE 12 HR
ORAL_TABLET | Freq: Two times a day (BID) | ORAL | 0 refills | 4 days | Status: CP
Start: 2020-06-24 — End: 2020-06-24

## 2020-06-24 MED ADMIN — mirtazapine (REMERON) tablet 15 mg: 15 mg | ORAL

## 2020-06-24 MED ADMIN — amLODIPine (NORVASC) tablet 10 mg: 10 mg | ORAL | @ 14:00:00

## 2020-06-24 MED ADMIN — HYDROmorphone (DILAUDID) injection 1 mg: 1 mg | INTRAVENOUS | @ 01:00:00 | Stop: 2020-07-06

## 2020-06-24 MED ADMIN — clonazePAM (KlonoPIN) tablet 0.5 mg: .5 mg | ORAL | @ 14:00:00 | Stop: 2020-06-24

## 2020-06-24 MED ADMIN — clonazePAM (KlonoPIN) tablet 1 mg: 1 mg | ORAL | @ 12:00:00 | Stop: 2020-06-24

## 2020-06-24 MED ADMIN — carvediloL (COREG) tablet 12.5 mg: 12.5 mg | ORAL

## 2020-06-24 MED ADMIN — blinatumomab (BLINCYTO) 9 mcg/day in sodium chloride 0.9% NON-PVC 0.9 % IVPB (24-HR INFUSION): 9 ug/d | INTRAVENOUS | @ 19:00:00 | Stop: 2020-06-25

## 2020-06-24 MED ADMIN — HYDROmorphone (DILAUDID) injection 1 mg: 1 mg | INTRAVENOUS | @ 20:00:00 | Stop: 2020-07-06

## 2020-06-24 MED ADMIN — carvediloL (COREG) tablet 12.5 mg: 12.5 mg | ORAL | @ 14:00:00

## 2020-06-24 MED ADMIN — HYDROmorphone (DILAUDID) injection 0.6 mg: .6 mg | INTRAVENOUS | @ 15:00:00 | Stop: 2020-06-24

## 2020-06-24 MED ADMIN — predniSONE (DELTASONE) tablet 50 mg: 50 mg | ORAL | @ 12:00:00

## 2020-06-24 MED ADMIN — PONATinib (ICLUSIG) tablet 30 mg **PATIENT SUPPLIED**: 30 mg | ORAL | @ 23:00:00

## 2020-06-24 MED ADMIN — HYDROmorphone (DILAUDID) injection 1 mg: 1 mg | INTRAVENOUS | @ 12:00:00 | Stop: 2020-07-06

## 2020-06-24 MED ADMIN — hydrOXYzine (ATARAX) tablet 25 mg: 25 mg | ORAL | @ 12:00:00

## 2020-06-24 MED ADMIN — gabapentin (NEURONTIN) capsule 100 mg: 100 mg | ORAL | @ 14:00:00

## 2020-06-24 MED ADMIN — gabapentin (NEURONTIN) capsule 100 mg: 100 mg | ORAL | @ 19:00:00

## 2020-06-24 MED ADMIN — HYDROmorphone (DILAUDID) injection 1 mg: 1 mg | INTRAVENOUS | @ 05:00:00 | Stop: 2020-07-06

## 2020-06-24 MED ADMIN — valACYclovir (VALTREX) tablet 500 mg: 500 mg | ORAL | @ 14:00:00

## 2020-06-24 MED ADMIN — HYDROmorphone (DILAUDID) injection 1 mg: 1 mg | INTRAVENOUS | @ 08:00:00 | Stop: 2020-07-06

## 2020-06-24 MED ADMIN — HYDROmorphone (DILAUDID) injection 1 mg: 1 mg | INTRAVENOUS | @ 16:00:00 | Stop: 2020-07-06

## 2020-06-24 MED ADMIN — hydrOXYzine (ATARAX) tablet 25 mg: 25 mg | ORAL | @ 18:00:00

## 2020-06-24 MED ADMIN — clonazePAM (KlonoPIN) tablet 0.5 mg: .5 mg | ORAL

## 2020-06-24 NOTE — Unmapped (Signed)
Assessed Adam Keith at bedside for nursing concerns for patient status. Per nursing, Adam Keith does not feel like himself and is feeling very frustrated.     On my assessment he endorses anxiety, 8/10 skin pain (not new, has been present since starting blinatumomab, being treated with IV dilaudid). He is frustrated because he is on a continuous infusion and has not been allowed to go outside. He understands the needs for the continuous blina infusion and is trying to be accommodating, but he feels trapped in his room. No psyc history other than depression/anxiety.    Denies SI/HI, unwanted thoughts. He is not planning to leave AMA as of yet, although has considered it so he can have the infusion paused so he can spend time outdoors. Both he and his wife Adam Keith say that he is usually not frustrated like this, and his frustration feels situational. ICE score 10/10, no S/S CRS or neurotoxicity.    Plan:  -1mg  IV dilaudid for pain  -1mg  IV ativan for anxiety  -Recommend rec therapy consult, PT/OT consult, or arranging with nursing (as able) to coordinate visit off floor Wednesday. Would need [facility] staff present given continuous chemo infusion.   -Adam Keith is aware that if he has any further concerns, he should alert nursing/provider.  -Continue to monitor overnight    The plan was discussed with Mr. & Mrs. Keith and nursing staff.    Jasmine Pang. Aurther Loft, MSN, APRN, AGNP-C  Nurse Practitioner  Hematology/Oncology  [facility]  Group pager: [phone]

## 2020-06-24 NOTE — Unmapped (Signed)
[facility]  Initial Psychiatry Consult Note     Service Date: June 24, 2020  LOS:  LOS: 5 days      Assessment:   Adam [name] with pertinent PMH of PH+ B cell ALL (diagnosed 01/20/20), adjustment disorder, anxiety, PTSD admitted 06/19/2020  1:23 PM for C1 Blinatumomab.  Patient was seen in consultation by Psychiatry at the request of Providence Crosby Dittu* with Oncology/Hematology (MDE) for evaluation of Anxiety and Medication recommendations.     Patient's previous hospital course was notable for several medical complications. During his hospitalization in January 2022, patient was amenable to psychotherapy with Dr. Lanae Boast to address coping with mood symptoms and demoralization. Over the course of that therapy, patient's mood improved significantly.    The patient's current presentation of persistent worried behavior and increased agitation towards leaving AMA in the setting of current frustration with chemotherapy treatment  is most consistent with adjustment disorder with anxious mood. These symptoms are exacerbated by patient's previous history of PTSD and recent death of a family member. Patient expresses strong desire to try medication that will help decrease anxiety regarding current situation. At this time, plan to adjust klonopin dose as detailed below. Additionally, we recommend follow-up with Dr. Lanae Boast for supportive psychotherapy throughout patient's hospitalization.     Please see below for detailed recommendations.    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    Safety Risk Assessment:  A suicide and violence risk assessment was performed as part of this evaluation. Risk factors for self-harm/suicide: recent onset of serious medical condition.  Protective factors against self-harm/suicide:  lack of active SI, no history of previous suicide attempts , motivation for treatment, supportive family, sense of responsibility to family and social supports, minor children living at home, presence of a significant relationship, presence of an available support system, enjoyment of leisure actvities, expresses purpose for living, current treatment compliance and safe housing.  Risk factors for harm to others: N/A.  Protective factors against harm to others: no active symptoms of psychosis, no active symptoms of mania and connectedness to family.  While future psychiatric events cannot be accurately predicted, the patient is not currently at elevated acute risk, and is not at elevated chronic risk of harm to self and is not currently at elevated acute risk, and is not at elevated chronic risk of harm to others.       Recommendations:   ## Safety:   -- No acute safety concerns.  Please see safety assessment for further discussion.    ## Medications:   -- INCREASE Klonopin to 1 mg PO BID  -- DECREASE PRN Klonopin to 0.5 mg PO BID  -- Continue mirtazapine 15 mg PO nightly     ## Medical Decision Making Capacity:   -- A formal capacity assessement was not performed as a part of this evaluation.  If specific capacity questions arise, please contact our team as below.     ## Further Work-up:   -- No recommendations at this time.    ## Disposition:   -- The patient will follow-up with ACT team for mental health care at the time of discharge.    ## Behavioral / Environmental:   -- Utilize compassion and acknowledge the patient's experiences while  setting clear and realistic expectations for care.     Thank you for this consult request. Recommendations have been communicated to the primary team.  We will follow as needed at this time. Please page [phone] for any questions or concerns.     This patient was evaluated in person.    Discussed with and seen by Attending, Voncille Lo, MD, who agrees with the assessment and plan.    Lowry Bowl, PA-S2    I saw and evaluated the patient, participating in the key portions of the service.  I reviewed the resident???s note.  I agree with the resident???s findings and plan.     Hubert Azure, MD   History:   Relevant Aspects of Hospital Course:   Admitted on 06/19/2020 for C1 Blinatumomab treatments. Has had two separate reactions to blinatumomab including 3/19 PM, Grade 1 reaction with upper torso erythema and temporal edema. Given complicated reaction with temporal edema, drug stopped. Patient was medicated with reaction meds. Drug restarted 3/20 after re-dosing with Dexamethasone. 3/20 PM, Grade 2 reaction with chest pain, anxiety, and generalized skin pain and itching. Drug stopped. On 3/21, dose reduced to 9 mcg as detailed above, with Dexamethasone pre-med. Currently on prednisone taper for DRESS.     Patient Report:   Patient seen laying on his side in bed, currently receiving blinatumumab infusion. States now is not a good time to talk with MD team. He reports significant pain and is due to receive Dilaudid soon, but states it is what it is. He reports only continuing with current treatment because he trusts Dr. Mercy Riding recommendation's for therapy. Otherwise, he thinks about of leaving AMA in order to go home. 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 When at home, he enjoys working on restoring boats and on woodworking, stating he appreciates making things that other people can't. In terms of how psychiatry can assist with his treatment, patient asks to be put into a coma or be given a medication that will make him sleep for 7 days so he can wake up and be finished with infusions. When asked if Klonopin or Ativan has worked better to help with anxiety in the past, patient is unsure. He is amenable to increasing current dose of Klonopin. Denies SI, stating suicide would be a selfish choice.     ROS:   All systems reviewed as negative/unremarkable aside from the following pertinent positives and negatives: pain due to infusion.     Collateral information:   - Reviewed medical records in Epic    Psychiatric History:   Information collected from patient interview and chart review.  Prior psychiatric diagnoses: Adjustment disorder with depressed and anxious mood, PTSD  Psychiatric hospitalizations: None  Suicide attempts / Non-suicidal self-injury: None  Medication trials/compliance: wellbutrin up to 300 mg daily (stopped d/t worsening depression/suicidal ideation), cymbalta up to 60 mg daily, ativan  Current OP psychiatric medication regimen: Mirtazapine 15 mg   Current psychiatrist: Does not appear patient currently has an outpatient psychiatrist-they are looking for a new clinician  Current therapist: None      Medical History:  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       Surgical History:  Past Surgical History:   Procedure Laterality Date    BONE MARROW BIOPSY & ASPIRATION  01/21/2020         IR INSERT PORT AGE GREATER  THAN 5 YRS  03/10/2020    IR INSERT PORT AGE GREATER THAN 5 YRS 03/10/2020 [name], MD IMG [facility]IR [facility]       Medications:     Current Facility-Administered Medications:     amLODIPine (NORVASC) tablet 10 mg, 10 mg, Oral, Daily, Herbert Moors, AGNP, 10 mg at 06/24/20 0952    [START ON 06/25/2020] blinatumomab (BLINCYTO) 28 mcg/day in sodium chloride 0.9% NON-PVC 0.9 % IVPB (24-HR INFUSION), 28 mcg/day, Intravenous, over 24 hr, Providence Crosby Dittus, MD    blinatumomab (BLINCYTO) 9 mcg/day in sodium chloride 0.9% NON-PVC 0.9 % IVPB (24-HR INFUSION), 9 mcg/day, Intravenous, over 24 hr, Providence Crosby Dittus, MD, Last Rate: 10 mL/hr at 06/23/20 1513, 10.375 mcg at 06/23/20 1513    carvediloL (COREG) tablet 12.5 mg, 12.5 mg, Oral, BID, Herbert Moors, AGNP, 12.5 mg at 06/24/20 2841    CHEMO CLARIFICATION ORDER, , Other, Continuous PRN, [address] [place], PA    CHEMO CLARIFICATION ORDER, , Other, Continuous PRN, [employer] [place] Vogler, AGNP    clonazePAM (KlonoPIN) tablet 0.5 mg, 0.5 mg, Oral, BID, [address] Vogler, AGNP, 0.5 mg at 06/24/20 3244    clonazePAM (KlonoPIN) tablet 1 mg, 1 mg, Oral, BID PRN, [employer] Chaco Vogler, AGNP, 1 mg at 06/24/20 0801    [START ON 06/25/2020] dexamethasone (DECADRON) 4 mg/mL injection 16 mg, 16 mg, Intravenous, Once, [facility] Crosby Dittus, MD    diphenhydrAMINE (BENADRYL) injection 25 mg, 25 mg, Intravenous, Q4H PRN, Guerry Bruin, MD, 25 mg at 06/22/20 0104    EPINEPHrine (EPIPEN) injection 0.3 mg, 0.3 mg, Intramuscular, Daily PRN, Guerry Bruin, MD    famotidine (PF) (PEPCID) injection 20 mg, 20 mg, Intravenous, Q4H PRN, Guerry Bruin, MD, 20 mg at 06/22/20 0104    gabapentin (NEURONTIN) capsule 100 mg, 100 mg, Oral, TID, Maryanna Shape, PA, 100 mg at 06/24/20 0102    hydrocortisone 2.5 % cream 1 application, 1 application, Topical, BID, Maryanna Shape, PA    HYDROmorphone (DILAUDID) injection 0.5 mg, 0.5 mg, Intravenous, Q4H PRN **OR** HYDROmorphone (DILAUDID) injection 1 mg, 1 mg, Intravenous, Q4H PRN, Army Chaco Vogler, AGNP, 1 mg at 06/24/20 0805    HYDROmorphone (DILAUDID) injection 0.6 mg, 0.6 mg, Intravenous, Once, Maryanna Shape, PA    hydrOXYzine (ATARAX) tablet 25 mg, 25 mg, Oral, Q6H PRN, Herbert Moors, AGNP, 25 mg at 06/24/20 0801    IP OKAY TO TREAT, , Other, Continuous PRN, Herbert Moors, AGNP    IP OKAY TO TREAT, , Other, Continuous PRN, [employer] Chaco Vogler, AGNP    loperamide (IMODIUM) capsule 2 mg, 2 mg, Oral, Q2H PRN, Herbert Moors, AGNP    loperamide (IMODIUM) capsule 4 mg, 4 mg, Oral, Once PRN, Herbert Moors, AGNP    meperidine (DEMEROL) injection 25 mg, 25 mg, Intravenous, Q30 Min PRN, Guerry Bruin, MD    methylPREDNISolone sodium succinate (PF) (Solu-MEDROL) injection 125 mg, 125 mg, Intravenous, Q4H PRN, Guerry Bruin, MD, 125 mg at 06/22/20 0104    mirtazapine (REMERON) tablet 15 mg, 15 mg, Oral, Nightly, Herbert Moors, AGNP, 15 mg at 06/23/20 2012    PONATinib (ICLUSIG) tablet 30 mg **PATIENT SUPPLIED**, 30 mg, Oral, Q24H, Guerry Bruin, MD, 30 mg at 06/23/20 1834    predniSONE (DELTASONE) tablet 50 mg, 50 mg, Oral, Daily, Herbert Moors, AGNP, 50 mg at 06/24/20 0801    prochlorperazine (COMPAZINE) injection 10 mg, 10 mg, Intravenous, Q6H PRN, Molli Hazard  Pamelia Hoit, MD    prochlorperazine (COMPAZINE) tablet 10 mg, 10 mg, Oral, Q6H PRN, Guerry Bruin, MD    sodium chloride (NS) 0.9 % infusion, 20 mL/hr, Intravenous, Continuous, Herbert Moors, AGNP    sodium chloride (NS) 0.9 % infusion, 20 mL/hr, Intravenous, Continuous PRN, Guerry Bruin, MD    sodium chloride 0.9% (NS) bolus 1,000 mL, 1,000 mL, Intravenous, Daily PRN, Guerry Bruin, MD    sulfamethoxazole-trimethoprim (BACTRIM DS) 800-160 mg tablet 160 mg of trimethoprim, 1 tablet, Oral, BID, Herbert Moors, AGNP, 160 mg of trimethoprim at 06/21/20 2006    triamcinolone (KENALOG) 0.1 % ointment, , Topical, BID, Maryanna Shape, PA    valACYclovir (VALTREX) tablet 500 mg, 500 mg, Oral, Daily, Herbert Moors, AGNP, 500 mg at 06/24/20 1610    Allergies:  Allergies   Allergen Reactions    Bupropion Hcl Other (See Comments)     Per patient out of touch with reality, suicidal, homicidal    Dapsone Other (See Comments) and Anaphylaxis     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    Onion Anaphylaxis    Vancomycin Analogues      Hearing loss with Lasix  Other reaction(s): Other (See Comments)  Hearing loss  lasix    Bismuth Subsalicylate Nausea And Vomiting    Ceftaroline Fosamil Rash and Other (See Comments)     Rash X 2 and pancytopenia 02/2020    Furosemide      With Vancomycin caused hearing loss  Other reaction(s): Other (See Comments)  With Vancomycin caused hearing loss       Social History: Living situation: the patient lives with their spouse. Has 17 year old twin girls from a previous marriage. 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   Abuse/Neglect/Trauma: He reports additional trauma related to the death of his son in 02-10-2021who died from a gun-related accident.     Tobacco use: Former smoker  Alcohol use: None.  Drug use: None    Family History: Reviewed and updated  The patient's family history is not on file.      Objective:   Vital signs:   Temp:  [35.6 ??C (96 ??F)-36.6 ??C (97.9 ??F)] 35.6 ??C (96 ??F)  Heart Rate:  [71-94] 71  Resp:  [16-20] 18  BP: (130-166)/(63-90) 150/86  MAP (mmHg):  [72-121] 72  SpO2:  [97 %-99 %] 98 %    Physical Exam:  Gen: No acute distress.  Pulm: Normal work of breathing.  Neuro/MSK: Normal bulk/tone. Gait/station deferred given patient lying in bed.  Skin: pale.    Mental Status Exam:  Appearance:  appears stated age and lying in bed, chronically ill appearing   Attitude:    Guarded but polite   Behavior/Psychomotor:  appropriate eye contact and continually rubbing head due to pain from infusion    Speech/Language:   normal rate, volume, tone, fluency and language intact, well formed   Mood:  ???in a lot of pain???   Affect:  blunted   Thought process:  logical, linear, clear, coherent, goal  directed   Thought content:    denies thoughts of self-harm. Denies SI, plans, or intent. Denies HI.  No grandiose, self-referential, persecutory, or paranoid delusions noted.   Perceptual disturbances:   behavior not concerning for response to internal stimuli   Attention:   able to complete DOWB correctly    Concentration:  Able to fully concentrate and attend   Orientation:  Oriented to person, place, city, date and situation.   Memory:  not formally tested, but grossly intact   Fund of knowledge:   consistent with level of education and development   Insight:    Intact   Judgment:   Fair   Impulse Control:  Intact       Data Reviewed:  I reviewed labs from the last 24 hours.     Additional Psychometric Testing:  Not applicable.

## 2020-06-24 NOTE — Unmapped (Signed)
Pt agitated at start of shift, reporting ongoing generalized burning and pain that was increasingly worsening. NP/Covering Provider made aware and Pt given Ativan for agitation and dilaudid PRN with good results. Pt has ongoing pain throughout the night, and receives some relief from PRN 1mg  dilaudid. Pt sleeps throughout most of the night. Blinatumimab cont at 32ml/hr per treatment plan with no new reaction. No blood return checks performed per Chemo treatment plan. Wife at bedside throughout shift. Pt VSS. Writing test performed per Chemo treatment plan, assessed by RN, Pt able to write and answers questions correctly. Pt has no falls or other adverse events.

## 2020-06-24 NOTE — Unmapped (Signed)
Vitals within baseline. Pt drowsy and oriented x4. PRN klonopin, atarax, and dilaudid given for anxiety, itching, and pain with good effect. Blinatumomab continued. Pt reports not feeling like himself. Provider notified. Blina continued. Symptoms treated. Family at bedside. Pt remained free of falls and injuries. Will continue to monitor.

## 2020-06-24 NOTE — Unmapped (Signed)
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    Plan Summary: C1D3=3/23 at 1500 of Blinatumomab at 9 mcg + Ponatinib. ICANs 10 today. Plan for 9 mcg x3 day then increased to (on 3/24) if tolerating. On Prednisone taper (see below) for DRESS, suture removal on 3/25. Re-Consulted dermatology given worsening skin symptoms. Refusing wet wraps. Continue Valtrex, Bactrim ppx. Consulted Psych given worsening anxiety, increased scheduled Klonopin. Started OxyContin 30 mg BID with IV Dilaudid and Gabapentin for skin pain. Continue Amlodipine and Coreg daily. Further supportive care and chronic meds as noted below.     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 The course was complicated by septic shock, candida krusei fungemia, MRSA bacteremia with septic emboli c/b acute renal failure and respiratory distress requiring dialysis and intubation. ??Post-induction bmbx (day 29) demonstrated flow-based??MRD-negative remission but low-level BCR-ABL persisted. Ultimately due to CKD, performance status, COVID infection and significant complications noted above cycle 2 of HyperCVAD was delayed and ultimately differed in favor of Blinatumomab. Last Bone marrow biopsy on 1/18 showed a normocellular marrow with less than 1% Blasts with 0.036% ratio for BCR/ABL.    - Valtrex, Bactrim ppx   - Ponatinib 30 mg daily   ??  Primary Oncologist: [name]  Chemotherapy: Blinatumomab (for MRD)  Cycle: 1 D1 = 3/18@ 2100, reaction x 2, plan as below started on 3/21  - Blinatumomab 9 mcg/day x 3 days (as inpatient)   - Blinatumomab 28 mcg/day x 2 days (as inpatient), then remainder of 28 day cycle outpatient.  - Plan to move timing of dose 6 to convenient time for home health bag changes  - Dexamethasone 20 mg day 1 and 4    [ ]  Assess for neuro toxicity - ICE score daily, include handwriting  [ ]  Assess for cytokine release syndrome  ??  Toxicities  - Frequent toxicities - pyrexia (60%), headache (34%), febrile neutropenia (28%), peripheral edema (26%), nausea (24%), hypokalemia (24%), constipation (21%), anemia (20%)  - 52% neurologic toxicity (mostly grade 1-2)  ??  Disposition   - Day prior to discharge, ensure nursing has instructions for discharge process for blinatumomab   - Bag changes at [facility] infusion due 3/21@1300  - will need to be changed pending tolerability of blinatumomab  - twice weekly lab checks, and CVAD dressing changes.   [ ]  F/U appointment in clinic after cycle +/- bone marrow biopsy     Reaction Log:   - 3/19 PM, Grade 1 reaction with upper torso erythema and temporal edema. Given complicated reaction with temporal edema, drug stopped. Patient was medicated with reaction meds. Drug restarted 3/20 after re-dosing with Dexamethasone.   - 3/20 PM, Grade 2 reaction with chest pain, anxiety, and generalized skin pain and itching. Drug stopped. On 3/21, dose reduced to 9 mcg as detailed above, with Dexamethasone pre-med.  - 3/21 PM - D1 of 9 mcg, plan x 3 day then increase to 28 mcg   ??  AKI to CKD: due to sepsis. Now s/p dialysis with stable Cr appears to have developed CKD due to incompletely healed acute injury.  - Dose medicines for creatinine clearance  of approximately 30-35  ??  DRESS: ??Followed by Dr. Caryn Section with [facility]. S/p punch biopsy on 3/11 and will need sutures removed 14 days after placement (3/25). He will continue prednisone taper and PRN hydroxyzine. Timing of onset occurred proximate to time when dose of dasatinib was being adjusted, posaconazole given and sotrovimab. ??Likely one of these changes was responsible for the DRESS.   [ ]  Sutures will need to be removed ~3/25   - Prednisone taper 50mg  daily X7 day (3/18-3/25) then taper by 10mg  daily each week  ??  History of Dyspnea, PND, edema, pleural effusion:??Likely due to dasatinib. Monitor after therapy change.  - No current symptoms of SOB  ??  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 Patient reports hypersensitivity of left hearing and difficulty with complex sounds and multiple conversations  - AVOID Lasix/loop diuretics, other ototoxic medications  ??  HTN: Chronic condition since diagnosis. Home regimen includes Amlodipine and Coreg.   - Amlodipine daily   - Coreg daily     Anxiety/Depression: Chronic condition. Was previously on scheduled Ativan. No longer taking. On Remoron 15 mg QHS. Anxiety worsened significantly since Blinatumomab initiation.  - Klonopin 1 mg PO BID scheduled and 0.5 mg PRN  - Continue Remeron 15 mg  QHS  ??  Chronic Back Pain: Complicated by prolonged hospital stays and procedures.     Skin pain/itching: Began with Blinatumomab initiation. Has been more responsive to IV pain meds than PO.  - Dilaudid 0.5-1 mg IV Q4HPRN for mod-severe pain  - Gabapentin 100 mg TID   - OxyContin 30 mg BID   - Atarax for itching as above  ??  History of Candidemia: Per [name] and Dr [name]??they feel that the risk of recurrent fungal infection is low if we can avoid neutropenia. No antifungal ppx at this time.  ??  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  Immunocompromised status: Patient is immunocompromised secondary  PH+ ALL disease and chemotherapy  - Antimicrobial prophylaxis as above    Nutrition:                        Subjective:   Afebrile, NAEON. Increased frustration this morning. Complaining of full body itching and pain under the skin. Declining wet wraps as this makes him feel claustrophobic.     10 point ROS otherwise negative except as above in the HPI.     Objective:     Vital signs in last 24 hours:  Temp:  [35.6 ??C (96 ??F)-36.6 ??C (97.9 ??F)] 36.1 ??C (97 ??F)  Heart Rate:  [71-94] 75  Resp:  [16-20] 16  BP: (142-166)/(69-90) 142/70  MAP (mmHg):  [72-121] 100  SpO2:  [97 %-98 %] 97 %    Intake/Output last 3 shifts:  I/O last 3 completed shifts:  In: 1058 [P.O.:700; I.V.:358]  Out: -     Meds:  Current Facility-Administered Medications   Medication Dose Route Frequency Provider Last Rate Last Admin   ??? amLODIPine (NORVASC) tablet 10 mg  10 mg Oral Daily Herbert Moors, AGNP   10 mg at 06/24/20 1610   ??? [START ON 06/25/2020] blinatumomab (BLINCYTO) 28 mcg/day in sodium chloride 0.9% NON-PVC 0.9 % IVPB (24-HR INFUSION)  28 mcg/day Intravenous over 24 hr Nita Sells, MD       ???  blinatumomab (BLINCYTO) 9 mcg/day in sodium chloride 0.9% NON-PVC 0.9 % IVPB (24-HR INFUSION)  9 mcg/day Intravenous over 24 hr Providence Crosby Dittus, MD 10 mL/hr at 06/24/20 1456 10.375 mcg at 06/24/20 1456   ??? carvediloL (COREG) tablet 12.5 mg  12.5 mg Oral BID Herbert Moors, AGNP   12.5 mg at 06/24/20 2956   ??? CHEMO CLARIFICATION ORDER   Other Continuous PRN [place], PA       ??? CHEMO CLARIFICATION ORDER   Other Continuous PRN Army Chaco Vogler, AGNP       ??? clonazePAM (KlonoPIN) tablet 0.5 mg  0.5 mg Oral BID PRN Maryanna Shape, PA       ??? clonazePAM (KlonoPIN) tablet 1 mg  1 mg Oral BID Maryanna Shape, PA       ??? [START ON 06/25/2020] dexamethasone (DECADRON) 4 mg/mL injection 16 mg  16 mg Intravenous Once Providence Crosby Dittus, MD       ??? diphenhydrAMINE (BENADRYL) injection 25 mg  25 mg Intravenous Q4H PRN Guerry Bruin, MD   25 mg at 06/22/20 0104   ??? EPINEPHrine (EPIPEN) injection 0.3 mg  0.3 mg Intramuscular Daily PRN Guerry Bruin, MD       ??? famotidine (PF) (PEPCID) injection 20 mg  20 mg Intravenous Q4H PRN Guerry Bruin, MD   20 mg at 06/22/20 0104   ??? gabapentin (NEURONTIN) capsule 100 mg  100 mg Oral TID Maryanna Shape, PA 100 mg at 06/24/20 1456   ??? hydrocortisone 2.5 % cream 1 application  1 application Topical BID Maryanna Shape, [place]       ??? HYDROmorphone (DILAUDID) injection 0.5 mg  0.5 mg Intravenous Q4H PRN [facility], AGNP        Or   ??? HYDROmorphone (DILAUDID) injection 1 mg  1 mg Intravenous Q4H PRN Army Chaco Vogler, AGNP   1 mg at 06/24/20 1221   ??? hydrOXYzine (ATARAX) tablet 25 mg  25 mg Oral Q6H PRN Herbert Moors, AGNP   25 mg at 06/24/20 1401   ??? IP OKAY TO TREAT   Other Continuous PRN Herbert Moors, AGNP       ??? IP OKAY TO TREAT   Other Continuous PRN Ernie Hew, AGNP       ??? loperamide (IMODIUM) capsule 2 mg  2 mg Oral Q2H PRN Herbert Moors, AGNP       ??? loperamide (IMODIUM) capsule 4 mg  4 mg Oral Once PRN Herbert Moors, AGNP       ??? meperidine (DEMEROL) injection 25 mg  25 mg Intravenous Q30 Min PRN Guerry Bruin, MD       ??? methylPREDNISolone sodium succinate (PF) (Solu-MEDROL) injection 125 mg  125 mg Intravenous Q4H PRN Guerry Bruin, MD   125 mg at 06/22/20 0104   ??? mirtazapine (REMERON) tablet 15 mg  15 mg Oral Nightly Herbert Moors, AGNP   15 mg at 06/23/20 2012   ??? oxyCODONE (OXYCONTIN) 12 hr crush resistant ER/CR tablet 30 mg  30 mg Oral Q12H SCH Maryanna Shape, [place]       ??? PONATinib (ICLUSIG) tablet 30 mg **PATIENT SUPPLIED**  30 mg Oral Q24H Guerry Bruin, MD   30 mg at 06/23/20 1834   ??? predniSONE (DELTASONE) tablet 50 mg  50 mg Oral Daily Herbert Moors, AGNP   50 mg at 06/24/20 2130   ??? prochlorperazine (COMPAZINE)  injection 10 mg  10 mg Intravenous Q6H PRN Guerry Bruin, MD       ??? prochlorperazine (COMPAZINE) tablet 10 mg  10 mg Oral Q6H PRN Guerry Bruin, MD       ??? sodium chloride (NS) 0.9 % infusion  20 mL/hr Intravenous Continuous Herbert Moors, AGNP       ??? sodium chloride (NS) 0.9 % infusion  20 mL/hr Intravenous Continuous PRN Guerry Bruin, MD       ??? sodium chloride 0.9% (NS) bolus 1,000 mL  1,000 mL Intravenous Daily PRN Guerry Bruin, MD       ??? sulfamethoxazole-trimethoprim (BACTRIM DS) 800-160 mg tablet 160 mg of trimethoprim  1 tablet Oral BID Herbert Moors, AGNP   160 mg of trimethoprim at 06/21/20 2006   ??? triamcinolone (KENALOG) 0.1 % ointment   Topical BID Maryanna Shape, PA       ??? valACYclovir (VALTREX) tablet 500 mg  500 mg Oral Daily Herbert Moors, AGNP   500 mg at 06/24/20 6440       Physical Exam:  General: Anxious, in no acute distress, OOB to sofa.  HEENT:  PER. No scleral icterus or conjunctival injection. MMM.   Heart:  RRR. S1, S2. No murmurs, gallops, or rubs.  Lungs:  Breathing is unlabored, and patient is speaking full sentences with ease. CTAB.   Abdomen:  Nontender, nondistended. BS normoactive. No palpable masses.  Skin:  No rashes, petechiae or purpura.  No areas of skin breakdown. Warm to touch, dry, smooth, and even.  Musculoskeletal:  No grossly-evident joint effusions or deformities.    Psychiatric:  Range of affect is appropriate.    Neurologic:  Alert and oriented to person, place, time and situation. CNII-CNXII grossly intact. ICE 10/10.   Extremities:  Appear well-perfused. No clubbing, pitting edema, or cyanosis.  CVAD: R CW Port - no erythema, nontender; dressing CDI.      Labs:  Recent Labs     06/22/20  0239 06/22/20  0240 06/23/20  0548 06/24/20  0605   WBC  --  22.9* 26.7* 23.7*   NEUTROABS  --  20.5* 23.9* 20.2*   LYMPHSABS  --  0.6* 0.9* 1.8   HGB  --  8.0* 8.7* 9.4*   HCT  --  23.9* 25.5* 27.8*   PLT  --  217 198 182   CREATININE 1.84*  --  1.89* 2.02*   BUN 26*  --  33* 49*   BILITOT 0.3  --  0.3 0.3   BILIDIR <0.10  --  <0.10 <0.10   AST 18  --  15 15   ALT 20  --  15 18   ALKPHOS 79  --  65 72   K 3.8  --  4.3 4.0   MG 1.6  --  1.6 1.6   CALCIUM 8.4*  --  8.6* 8.6*   NA 143  --  141 142   CL 112*  --  108* 109*   CO2 22.0  --  24.0 22.0   PHOS 2.9  --  4.7 4.5   CRP 5.0  --  <4.0 <4.0       Imaging:  No new.    Irena Cords, PA-C  06/24/2020  3:41 PM   Physician Assistant   Hematology/Oncology Department   [facility]   Group Pager: [phone]     06/24/20

## 2020-06-25 ENCOUNTER — Ambulatory Visit
Admit: 2020-06-23 | Payer: MEDICAID | Attending: Rehabilitative and Restorative Service Providers" | Primary: Rehabilitative and Restorative Service Providers"

## 2020-06-25 LAB — CBC W/ AUTO DIFF
BASOPHILS ABSOLUTE COUNT: 0 10*9/L (ref 0.0–0.1)
BASOPHILS RELATIVE PERCENT: 0.1 %
EOSINOPHILS ABSOLUTE COUNT: 0 10*9/L (ref 0.0–0.5)
EOSINOPHILS RELATIVE PERCENT: 0.1 %
HEMATOCRIT: 28.8 % — ABNORMAL LOW (ref 39.0–48.0)
HEMOGLOBIN: 9.8 g/dL — ABNORMAL LOW (ref 12.9–16.5)
LYMPHOCYTES ABSOLUTE COUNT: 2.2 10*9/L (ref 1.1–3.6)
LYMPHOCYTES RELATIVE PERCENT: 9.1 %
MEAN CORPUSCULAR HEMOGLOBIN CONC: 34 g/dL (ref 32.0–36.0)
MEAN CORPUSCULAR HEMOGLOBIN: 31.4 pg (ref 25.9–32.4)
MEAN CORPUSCULAR VOLUME: 92.4 fL (ref 77.6–95.7)
MEAN PLATELET VOLUME: 7.1 fL (ref 6.8–10.7)
MONOCYTES ABSOLUTE COUNT: 1.3 10*9/L — ABNORMAL HIGH (ref 0.3–0.8)
MONOCYTES RELATIVE PERCENT: 5.5 %
NEUTROPHILS ABSOLUTE COUNT: 20.6 10*9/L — ABNORMAL HIGH (ref 1.8–7.8)
NEUTROPHILS RELATIVE PERCENT: 85.2 %
PLATELET COUNT: 175 10*9/L (ref 150–450)
RED BLOOD CELL COUNT: 3.12 10*12/L — ABNORMAL LOW (ref 4.26–5.60)
RED CELL DISTRIBUTION WIDTH: 19.4 % — ABNORMAL HIGH (ref 12.2–15.2)
WBC ADJUSTED: 24.1 10*9/L — ABNORMAL HIGH (ref 3.6–11.2)

## 2020-06-25 LAB — BASIC METABOLIC PANEL
ANION GAP: 8 mmol/L (ref 5–14)
BLOOD UREA NITROGEN: 34 mg/dL — ABNORMAL HIGH (ref 9–23)
BUN / CREAT RATIO: 18
CALCIUM: 8.6 mg/dL — ABNORMAL LOW (ref 8.7–10.4)
CHLORIDE: 108 mmol/L — ABNORMAL HIGH (ref 98–107)
CO2: 25 mmol/L (ref 20.0–31.0)
CREATININE: 1.88 mg/dL — ABNORMAL HIGH
EGFR CKD-EPI AA MALE: 51 mL/min/{1.73_m2} — ABNORMAL LOW (ref >=60)
EGFR CKD-EPI NON-AA MALE: 44 mL/min/{1.73_m2} — ABNORMAL LOW (ref >=60)
GLUCOSE RANDOM: 85 mg/dL (ref 70–179)
POTASSIUM: 4.1 mmol/L (ref 3.4–4.8)
SODIUM: 141 mmol/L (ref 135–145)

## 2020-06-25 LAB — C-REACTIVE PROTEIN: C-REACTIVE PROTEIN: <4 mg/L (ref <=10.0)

## 2020-06-25 LAB — HEPATIC FUNCTION PANEL
ALBUMIN: 3.2 g/dL — ABNORMAL LOW (ref 3.4–5.0)
ALKALINE PHOSPHATASE: 69 U/L (ref 46–116)
ALT (SGPT): 19 U/L (ref 10–49)
AST (SGOT): 18 U/L (ref <=34)
BILIRUBIN DIRECT: 0.1 mg/dL (ref 0.00–0.30)
BILIRUBIN TOTAL: 0.4 mg/dL (ref 0.3–1.2)
PROTEIN TOTAL: 5.7 g/dL (ref 5.7–8.2)

## 2020-06-25 LAB — PHOSPHORUS: PHOSPHORUS: 4.7 mg/dL (ref 2.4–5.1)

## 2020-06-25 LAB — MAGNESIUM: MAGNESIUM: 1.7 mg/dL (ref 1.6–2.6)

## 2020-06-25 MED ADMIN — predniSONE (DELTASONE) tablet 50 mg: 50 mg | ORAL | @ 13:00:00

## 2020-06-25 MED ADMIN — gabapentin (NEURONTIN) capsule 100 mg: 100 mg | ORAL

## 2020-06-25 MED ADMIN — hydrOXYzine (ATARAX) tablet 25 mg: 25 mg | ORAL | @ 11:00:00

## 2020-06-25 MED ADMIN — clonazePAM (KlonoPIN) tablet 0.5 mg: .5 mg | ORAL | @ 23:00:00

## 2020-06-25 MED ADMIN — HYDROmorphone (DILAUDID) injection 0.6 mg: .6 mg | INTRAVENOUS | @ 15:00:00 | Stop: 2020-07-06

## 2020-06-25 MED ADMIN — carvediloL (COREG) tablet 12.5 mg: 12.5 mg | ORAL | @ 13:00:00

## 2020-06-25 MED ADMIN — clonazePAM (KlonoPIN) tablet 1 mg: 1 mg | ORAL | @ 13:00:00

## 2020-06-25 MED ADMIN — gabapentin (NEURONTIN) capsule 200 mg: 200 mg | ORAL | @ 18:00:00

## 2020-06-25 MED ADMIN — clonazePAM (KlonoPIN) tablet 1 mg: 1 mg | ORAL

## 2020-06-25 MED ADMIN — oxyCODONE (OXYCONTIN) 12 hr crush resistant ER/CR tablet 20 mg: 20 mg | ORAL | @ 13:00:00 | Stop: 2020-06-25

## 2020-06-25 MED ADMIN — mirtazapine (REMERON) tablet 15 mg: 15 mg | ORAL

## 2020-06-25 MED ADMIN — dexamethasone (DECADRON) 4 mg/mL injection 16 mg: 16 mg | INTRAVENOUS | @ 18:00:00 | Stop: 2020-06-25

## 2020-06-25 MED ADMIN — HYDROmorphone (DILAUDID) injection 0.6 mg: .6 mg | INTRAVENOUS | @ 22:00:00 | Stop: 2020-07-06

## 2020-06-25 MED ADMIN — HYDROmorphone (DILAUDID) injection 1 mg: 1 mg | INTRAVENOUS | Stop: 2020-07-06

## 2020-06-25 MED ADMIN — PONATinib (ICLUSIG) tablet 30 mg **PATIENT SUPPLIED**: 30 mg | ORAL | @ 21:00:00

## 2020-06-25 MED ADMIN — hydrOXYzine (ATARAX) tablet 25 mg: 25 mg | ORAL

## 2020-06-25 MED ADMIN — valACYclovir (VALTREX) tablet 500 mg: 500 mg | ORAL | @ 13:00:00

## 2020-06-25 MED ADMIN — oxyCODONE (OXYCONTIN) 12 hr crush resistant ER/CR tablet 30 mg: 30 mg | ORAL | Stop: 2020-07-08

## 2020-06-25 MED ADMIN — HYDROmorphone (DILAUDID) injection 0.6 mg: .6 mg | INTRAVENOUS | @ 19:00:00 | Stop: 2020-07-06

## 2020-06-25 MED ADMIN — carvediloL (COREG) tablet 12.5 mg: 12.5 mg | ORAL

## 2020-06-25 MED ADMIN — aspirin chewable tablet 81 mg: 81 mg | ORAL | @ 18:00:00

## 2020-06-25 MED ADMIN — hydrOXYzine (ATARAX) tablet 25 mg: 25 mg | ORAL | @ 18:00:00

## 2020-06-25 MED ADMIN — amLODIPine (NORVASC) tablet 10 mg: 10 mg | ORAL | @ 13:00:00

## 2020-06-25 MED ADMIN — HYDROmorphone (DILAUDID) injection 1 mg: 1 mg | INTRAVENOUS | @ 11:00:00 | Stop: 2020-06-25

## 2020-06-25 MED ADMIN — gabapentin (NEURONTIN) capsule 200 mg: 200 mg | ORAL | @ 13:00:00

## 2020-06-25 MED ADMIN — blinatumomab (BLINCYTO) 28 mcg/day in sodium chloride 0.9% NON-PVC 0.9 % IVPB (24-HR INFUSION): 28 ug/d | INTRAVENOUS | @ 19:00:00 | Stop: 2020-06-27

## 2020-06-25 NOTE — Unmapped (Signed)
Dermatology Inpatient Consult Note    Requesting Attending Physician :  Providence Crosby Dittu*  Service Requesting Consult : Oncology/Hematology (MDE)  Consulting Attending Physician: Gabriel Carina MD    Reason for Consult: Adam Keith is seen in consultation today by Gabriel Carina MD at the request of Dr. Providence Crosby Dittu* of the Oncology/Hematology (MDE) service for evaluation of burning, itching skin.    Assessment/Recommendations:    Significant Pruritus and Burning Pain of the skin  - No notable cutaneous features to give insight into current symptoms  - Given normal absolute eosinophil count, ALT, AST and lack of skin findings, unlikely that this is indicative of a flare of his previously diagnosed DRESS  - Diffuse pruritus without obvious rash can result secondary to underlying malignancy, polycythemia vera (his HGB is low, not elevated), cholestasis, uremia, and hypothyroidism. He does have a chronically elevated creatinine, but no other signs concerning for uremia. Pruritus can also be neurogenic in origin.  - With inability to use topical medications, treatment options are limited    Recommendations:  - Consider increasing dose of gabapentin as tolerated   - Opiate analgesics may contribute to itching; attempt to avoid these if able    Prior History of DRESS  - Unclear underlying medication trigger; he has had exposure to multiple cephalosporins intermittently in the past few months and varying elevations in absolute eosinophil count since early 12/21, however his cutaneous eruption was not present until around 05/31/20.   - Prior H&E from outpatient dermatology visit on 3/11 consistent with an eczematous drug reaction. Given his elevated eosinophil count, concern was for DRESS at that time. He had a calculated RegiSCAR score of 4.   - Well controlled at this time with absolute eosinophil count at 0, normal ALT and AST, and no cutaneous findings consistent with DRESS  - HHV-6 negative    Recommendations:  - Continue on prednisone taper as previously prescribed  - continue to monitor CBC, Cr, LFTs   - reconsult dermatology if concerns for recurrence     Thank you for the consult. We will sign off at this time. Please page [phone] to re-consult if new or worsening skin involvement.  ______________________________________________________________________    History of Present Illness: :  Adam [name] with recent diagnosis of ALL s/p induction therapy with course complicated by septic shock, candida fungemia, MRSA bacteremia with septic emboli, acute renal failure and respiratory distress requiring dialysis and intubation; now on blinatumomab. He was admitted on 06/19/2020 for initiation of blinatumomab.     We have been consulted to evaluate itching and burning of his skin. He reports that starting around 05/30/20 when he was diagnosed with COVID, he developed a rash that was associated with itching and burning of his skin. He states that it itches so badly from the inside of the skin out that he will scratch it, and then it is so painful as a result that he wants to cut his skin off. He was evaluated in outpatient dermatology on 06/12/20 and a biopsy was performed that revealed features concerning for an eczematous medication reaction. Due to his facial edema, elevated eosinophils and calculated RegiSCAR score of 4, concern was for DRESS without a clear medication culprit. He was continued on a prednisone taper (starting at 60mg  and decreasing by 10mg  every week) and benadryl. His rash has since cleared, but he continues to have the burning and itchy sensations in his skin all over his body. He  continues on prednisone 50mg  daily at this time. He refuses topical therapies for management of his symptoms, and his symptoms are so significant at this time that he is requesting dilaudid for relief. His primary team has initiated gabapentin today.    No other complaints today.    Allergies:  Bupropion hcl, Dapsone, Onion, Vancomycin analogues, Bismuth subsalicylate, Ceftaroline fosamil, and Furosemide    Medications:   Current medication list reviewed in Epic.    Medical History: reviewed in Epic.  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  Negative for chronic skin disease.     Review of Systems:  Pertinent positives in HPI.  All systems were reviewed and were negative unless mentioned in HPI.     Objective: :    Vitals:  Vitals:    06/24/20 0026 06/24/20 0715 06/24/20 1115 06/24/20 1515   BP: 144/69 150/86 142/70 144/81   Pulse: 87 71 75 88   Resp: 20 18 16 18    Temp: 36.5 ??C 35.6 ??C 36.1 ??C 36.1 ??C   TempSrc: Oral Oral Oral Oral   SpO2: 98% 98% 97% 98%   Weight:       Height:           Physical Exam:  GEN:  uncomfortable  appearing in NAD  NEURO: alert and oriented, interacts appropriately  SKIN: Examination with inspection and palpation of the head, neck, chest, abdomen, back, buttocks, external genitalia, right upper extremity, left upper extremity, right lower extremity, left lower extremity, was performed and notable for the following. All other areas examined were normal or had no significant findings.  - excoriations on the genital skin  - ? Mild erythema of the upper arms, back and chest    Labs / Studies    Diagnosis   Date Value Ref Range Status   06/12/2020   Final Left arm, punch, two specimens  - Mild spongiosis with superficial dermal perivascular lymphoeosinophilic infiltrate, consistent with eczematous drug reaction, see comment         Recent Labs   Lab Units 06/24/20  0605 06/23/20  0548 06/22/20  0240   WBC 10*9/L 23.7* 26.7* 22.9*   RBC 10*12/L 3.01* 2.79* 2.60*   HEMOGLOBIN g/dL 9.4* 8.7* 8.0*   HEMATOCRIT % 27.8* 25.5* 23.9*   PLATELET COUNT (1) 10*9/L 182 198 217   MONO ABS 10*9/L 1.7* 1.8* 1.7*   BASOS ABS 10*9/L 0.1 0.0 0.1       Recent Labs   Lab Units 06/24/20  0605 06/23/20  0548 06/22/20  0239   SODIUM mmol/L 142 141 143   POTASSIUM mmol/L 4.0 4.3 3.8   CHLORIDE mmol/L 109* 108* 112*  CO2 mmol/L 22.0 24.0 22.0   BUN mg/dL 49* 33* 26*   CREATININE mg/dL 1.61* 0.96* 0.45*   EGFR CKD-EPI AA MALE mL/min/1.29m2 46* 50* 52*   EGFR CKD-EPI NON-AA MALE mL/min/1.39m2 40* 43* 45*   GLUCOSE mg/dL 84 409 811   CALCIUM mg/dL 8.6* 8.6* 8.4*   ALBUMIN g/dL 3.2* 3.0* 3.0*   PROTEIN TOTAL g/dL 5.6* 5.5* 5.5*   BILIRUBIN TOTAL mg/dL 0.3 0.3 0.3   ALK PHOS U/L 72 65 79   ALT U/L 18 15 20    AST U/L 15 15 18

## 2020-06-25 NOTE — Unmapped (Signed)
[facility]  Follow-Up Psychiatry Consult Note     Service Date: June 25, 2020  LOS:  LOS: 6 days      Assessment:   Adam [name] with pertinent PMH of PH+ B cell ALL (diagnosed 01/20/20), adjustment disorder, anxiety, PTSD admitted 06/19/2020  1:23 PM for C1 Blinatumomab.  Patient was seen in consultation by Psychiatry at the request of Providence Crosby Dittu* with Oncology/Hematology ([facility]DE) for evaluation of Anxiety and Medication recommendations.     Patient's previous hospital course was notable for several medical complications. During his hospitalization in January 2022, patient was amenable to psychotherapy with Dr. Lanae Boast to address coping with mood symptoms and demoralization. Over the course of that therapy, patient's mood improved significantly.    The patient's current presentation of persistent worried behavior and increased agitation towards leaving AMA in the setting of current frustration with chemotherapy treatment  is most consistent with adjustment disorder with anxious mood. These symptoms are exacerbated by patient's previous history of PTSD and recent death of a family member. Patient expresses strong desire to try medication that will help decrease anxiety regarding current situation. At this time, plan to adjust klonopin dose as detailed below. Additionally, we recommend follow-up with Dr. Lanae Boast for supportive psychotherapy throughout patient's hospitalization.     Today, notably more sedated and is unsure about impact on anxiety. Appears calmer. No changes to medication regimen today but may adjust klonopin in the future if causing oversedation during the day.     Please see below for detailed recommendations.    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    Safety Risk Assessment:  A suicide and violence risk assessment was performed as part of this evaluation. Risk factors for self-harm/suicide: recent onset of serious medical condition.  Protective factors against self-harm/suicide:  lack of active SI, no history of previous suicide attempts , motivation for treatment, supportive family, sense of responsibility to family and social supports, minor children living at home, presence of a significant relationship, presence of an available support system, enjoyment of leisure actvities, expresses purpose for living, current treatment compliance and safe housing.  Risk factors for harm to others: N/A.  Protective factors against harm to others: no active symptoms of psychosis, no active symptoms of mania and connectedness to family.  While future psychiatric events cannot be accurately predicted, the patient is not currently at elevated acute risk, and is not at elevated chronic risk of harm to self and is not currently at elevated acute risk, and is not at elevated chronic risk of harm to others.       Recommendations:   ## Safety:   -- No acute safety concerns.  Please see safety assessment for further discussion.    ## Medications:   -- Continue Klonopin 1 mg PO BID (would add hold for sedation to order)  -- Continue PRN Klonopin 0.5 mg PO BID  -- Continue mirtazapine 15 mg PO nightly     ## Medical Decision Making Capacity:   -- A formal capacity assessement was not performed as a part of this evaluation.  If specific capacity questions arise, please contact our team as below.     ## Further Work-up:   -- No recommendations at this time.    ##  Disposition:   -- The patient will follow-up with ACT team for mental health care at the time of discharge.    ## Behavioral / Environmental:   -- Utilize compassion and acknowledge the patient's experiences while setting clear and realistic expectations for care.     Thank you for this consult request. Recommendations have been communicated to the primary team.  We will follow as needed at this time. Please page [phone] for any questions or concerns.     This patient was evaluated in person.    Discussed with and seen by Attending, Voncille Lo, MD, who agrees with the assessment and plan.    Janora Norlander, MD    I saw and evaluated the patient, participating in the key portions of the service.  I reviewed the resident???s note.  I agree with the resident???s findings and plan.     Hubert Azure, MD     Interval History:   Relevant Aspects of Hospital Course:   Admitted on 06/19/2020 for C1 Blinatumomab treatments. Has had two separate reactions to blinatumomab including 3/19 PM, Grade 1 reaction with upper torso erythema and temporal edema. Given complicated reaction with temporal edema, drug stopped. Patient was medicated with reaction meds. Drug restarted 3/20 after re-dosing with Dexamethasone. 3/20 PM, Grade 2 reaction with chest pain, anxiety, and generalized skin pain and itching. Drug stopped. On 3/21, dose reduced to 9 mcg as detailed above, with Dexamethasone pre-med. Currently on prednisone taper for DRESS.     NAEO.    Patient Report:   Patient seen laying on his side in bed, currently receiving blinatumumab infusion. Sleeping on arrival but awakens to voice-frequently falls asleep during interview. Amenable to brief interview this AM. Says he feels the same this morning, like crap. Slept well overnight. Is unsure if his anxiety is any better or if he has noticed any difference in anxiety with the medication change, I'm anxious when my wife leaves. He is unsure if she is coming today, I turned my phone off. When asked how he is feeling about everything in general he says I don't know but then later says he wants to go home, I've got a ton of stuff to do. Is unsure how long he will be here. Does not feel confused or more sedated. Physically he is feeling not too bad this morning because I haven't have to deal with people yet. Oriented to person, place, month/year but not date, oriented to situation.     ROS:   All systems reviewed as negative/unremarkable aside from the following pertinent positives and negatives: pain due to infusion.     Collateral information:   - Reviewed medical records in Epic    Psychiatric, medical, surgical, social history updated as indicated       Medications:     Current Facility-Administered Medications:     amLODIPine (NORVASC) tablet 10 mg, 10 mg, Oral, Daily, Herbert Moors, AGNP, 10 mg at 06/25/20 0920    blinatumomab (BLINCYTO) 28 mcg/day in sodium chloride 0.9% NON-PVC 0.9 % IVPB (24-HR INFUSION), 28 mcg/day, Intravenous, over 24 hr, Providence Crosby Dittus, MD    blinatumomab (BLINCYTO) 9 mcg/day in sodium chloride 0.9% NON-PVC 0.9 % IVPB (24-HR INFUSION), 9 mcg/day, Intravenous, over 24 hr, Providence Crosby Dittus, MD, Last Rate: 10 mL/hr at 06/24/20 1456, 10.375 mcg at 06/24/20 1456    carvediloL (COREG) tablet 12.5 mg, 12.5 mg, Oral, BID, Herbert Moors, AGNP, 12.5 mg at 06/25/20 0920    CHEMO  CLARIFICATION ORDER, , Other, Continuous PRN, Court Joy [place], PA    CHEMO CLARIFICATION ORDER, , Other, Continuous PRN, Army Chaco Vogler, AGNP    clonazePAM (KlonoPIN) tablet 0.5 mg, 0.5 mg, Oral, BID PRN, Maryanna Shape, PA    clonazePAM (KlonoPIN) tablet 1 mg, 1 mg, Oral, BID, Maryanna Shape, PA, 1 mg at 06/25/20 0920    dexamethasone (DECADRON) 4 mg/mL injection 16 mg, 16 mg, Intravenous, Once, Providence Crosby Dittus, MD    diphenhydrAMINE (BENADRYL) injection 25 mg, 25 mg, Intravenous, Q4H PRN, Guerry Bruin, MD, 25 mg at 06/22/20 0104    EPINEPHrine [facility]) injection 0.3 mg, 0.3 mg, Intramuscular, Daily PRN, Guerry Bruin, MD    famotidine (PF) (PEPCID) injection 20 mg, 20 mg, Intravenous, Q4H PRN, Guerry Bruin, MD, 20 mg at 06/22/20 0104    gabapentin (NEURONTIN) capsule 200 mg, 200 mg, Oral, TID, Maryanna Shape, PA, 200 mg at 06/25/20 0920    hydrocortisone 2.5 % cream 1 application, 1 application, Topical, BID, Maryanna Shape, PA    HYDROmorphone (DILAUDID) injection 0.2 mg, 0.2 mg, Intravenous, Q4H PRN **OR** HYDROmorphone (DILAUDID) injection 0.6 mg, 0.6 mg, Intravenous, Q4H PRN, Maryanna Shape, PA    hydrOXYzine (ATARAX) tablet 25 mg, 25 mg, Oral, Q6H PRN, Herbert Moors, AGNP, 25 mg at 06/25/20 9604    IP OKAY TO TREAT, , Other, Continuous PRN, Herbert Moors, AGNP    IP OKAY TO TREAT, , Other, Continuous PRN, Army Chaco Vogler, AGNP    loperamide (IMODIUM) capsule 2 mg, 2 mg, Oral, Q2H PRN, Herbert Moors, AGNP    loperamide (IMODIUM) capsule 4 mg, 4 mg, Oral, Once PRN, Herbert Moors, AGNP    meperidine (DEMEROL) injection 25 mg, 25 mg, Intravenous, Q30 Min PRN, Guerry Bruin, MD    methylPREDNISolone sodium succinate (PF) (Solu-MEDROL) injection 125 mg, 125 mg, Intravenous, Q4H PRN, Guerry Bruin, MD, 125 mg at 06/22/20 0104    mirtazapine (REMERON) tablet 15 mg, 15 mg, Oral, Nightly, Herbert Moors, AGNP, 15 mg at 06/24/20 2009    oxyCODONE (OXYCONTIN) 12 hr crush resistant ER/CR tablet 20 mg, 20 mg, Oral, Q12H SCH, Maryanna Shape, PA, 20 mg at 06/25/20 0920    PONATinib (ICLUSIG) tablet 30 mg **PATIENT SUPPLIED**, 30 mg, Oral, Q24H, Guerry Bruin, MD, 30 mg at 06/24/20 1920    predniSONE (DELTASONE) tablet 50 mg, 50 mg, Oral, Daily, Herbert Moors, AGNP, 50 mg at 06/25/20 0920    prochlorperazine (COMPAZINE) injection 10 mg, 10 mg, Intravenous, Q6H PRN, Guerry Bruin, MD    prochlorperazine (COMPAZINE) tablet 10 mg, 10 mg, Oral, Q6H PRN, Guerry Bruin, MD    sodium chloride (NS) 0.9 % infusion, 20 mL/hr, Intravenous, Continuous, Herbert Moors, AGNP    sodium chloride (NS) 0.9 % infusion, 20 mL/hr, Intravenous, Continuous PRN, Guerry Bruin, MD    sodium chloride 0.9% (NS) bolus 1,000 mL, 1,000 mL, Intravenous, Daily PRN, Guerry Bruin, MD    sulfamethoxazole-trimethoprim (BACTRIM DS) 800-160 mg tablet 160 mg of trimethoprim, 1 tablet, Oral, BID, Herbert Moors, AGNP, 160 mg of trimethoprim at 06/21/20 2006    triamcinolone (KENALOG) 0.1 % ointment, , Topical, BID, Maryanna Shape, PA    valACYclovir (VALTREX) tablet 500 mg, 500 mg, Oral, Daily, Herbert Moors, AGNP, 500 mg at 06/25/20 5409    Allergies:  Allergies   Allergen Reactions    Bupropion Hcl Other (See Comments)  Per patient out of touch with reality, suicidal, homicidal    Dapsone Other (See Comments) and Anaphylaxis     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    Onion Anaphylaxis    Vancomycin Analogues      Hearing loss with Lasix  Other reaction(s): Other (See Comments)  Hearing loss  lasix    Bismuth Subsalicylate Nausea And Vomiting    Ceftaroline Fosamil Rash and Other (See Comments)     Rash X 2 and pancytopenia 02/2020    Furosemide      With Vancomycin caused hearing loss  Other reaction(s): Other (See Comments)  With Vancomycin caused hearing loss       Objective:   Vital signs:   Temp:  [36 ??C-36.7 ??C] 36.2 ??C  Heart Rate:  [65-97] 65  Resp:  [16-18] 18  BP: (142-147)/(70-91) 145/91  MAP (mmHg):  [100-112] 112  SpO2:  [97 %-99 %] 98 %    Physical Exam:  Gen: No acute distress.  Pulm: Normal work of breathing.  Neuro/MSK: Normal bulk/tone. Gait/station deferred given patient lying in bed.  Skin: pale.    Mental Status Exam:  Appearance:  appears stated age and lying in bed, chronically ill appearing   Attitude:   sedated and minimally interactive   Behavior/Psychomotor:  limited eye contact and no abnormal movements   Speech/Language:   normal rate, volume, tone, fluency and language intact, well formed   Mood:  ???The same   Affect:  sedated   Thought process:  logical, linear, clear, coherent, goal directed   Thought content:     No endorsement of suicidal or homicidal ideation   Perceptual disturbances:   behavior not concerning for response to internal stimuli   Attention:  inattentive, falls asleep repeatedly   Concentration:  Distractible   Orientation:  Oriented to person, place, city, month, year and situation. and Disoriented to  date and day.   Memory:  not formally tested, but grossly intact   Fund of knowledge:   consistent with level of education and development   Insight:    Intact   Judgment:   Fair   Impulse Control:  Intact       Data Reviewed:  I reviewed labs from the last 24 hours.     Additional Psychometric Testing:  Not applicable.

## 2020-06-25 NOTE — Unmapped (Signed)
Pt slept well overnight with no complaints after giving bedtime medications. Pain + itching medication given x1 dose each prior to bed. Pt did not want to use prescribed creams. VSS and afebrile. Will continue to monitor.

## 2020-06-25 NOTE — Unmapped (Signed)
VSS. Day #4 of Blinatumomab at 1504. Wife at [facility] assisting in care. No falls or injuries. Atarax given x2 Dilaudid given x3. Continues to refuse wraps and creams. Monitoring   Problem: Adult Inpatient Plan of Care  Goal: Plan of Care Review  Outcome: Ongoing - Unchanged  Goal: Patient-Specific Goal (Individualized)  Outcome: Ongoing - Unchanged  Goal: Absence of Hospital-Acquired Illness or Injury  Outcome: Ongoing - Unchanged  Intervention: Identify and Manage Fall Risk  Recent Flowsheet Documentation  Taken 06/25/2020 0722 by Silvano Bilis, RN  Safety Interventions:   low bed   chemotherapeutic agent precautions   neutropenic precautions   nonskid shoes/slippers when out of bed  Goal: Optimal Comfort and Wellbeing  Outcome: Ongoing - Unchanged  Goal: Readiness for Transition of Care  Outcome: Ongoing - Unchanged  Goal: Rounds/Family Conference  Outcome: Ongoing - Unchanged     Problem: Impaired Wound Healing  Goal: Optimal Wound Healing  Outcome: Ongoing - Unchanged     Problem: Hypertension Comorbidity  Goal: Blood Pressure in Desired Range  Outcome: Ongoing - Unchanged

## 2020-06-25 NOTE — Unmapped (Signed)
[facility]  [address] [place], [place] [address]    [phone]    Breckan Cafiero was not present for his scheduled Physical Therapy follow-up session. Chart review indicates that the patient is currently in the hospital. This is his last scheduled PT visit. If the patient would like to return to outpatient PT, he will need a new prescription. Please contact me if you have any questions or concerns.    Thank you for this referral,     Signed: Eulas Post, PT  06/25/2020 8:55 AM

## 2020-06-25 NOTE — Unmapped (Signed)
[facility])   Consultation - Liaison (CL) Psychiatry  CL Psychology Psychotherapy Note         Service Date:  06/24/20  Admit Date:  06/19/20  Clinician:  Lanae Boast, Psy.D.     Intervention:  16-37 min Individual Psychotherapy     BACKGROUND INFORMATION AND REASON FOR REFERRAL: Please see H&P for full details. Briefly, the patient is a 40yo male with pertinent past medical and mental health history of PH+ B cell ALL (diagnosed 01/20/20), adjustment disorder, anxiety, PTSD admitted 06/19/2020  1:23 PM for C1 Blinatumomab. He is being followed by Oncology/Hematology for management of the concerns above. CL Psychiatry services were requested for evaluation of anxiety and medication recommendations. Per their evaluation, the patient???s presentation is consistent with adjustment disorder with anxious mood. CL Psychology services were requested to provide psychotherapy to address mood symptoms while medically hospitalized. Of note, this patient is known to this clinician from previous hospitalization in January 2022. He was seen today for psychotherapy session .    ASSESSMENT  IMPRESSIONS/SUMMARY    The patient endorsed significant anxiety in the setting of health-related stress and being in the hospital. His history is remarkable for depression, and suspect underlying component of depression is contributing to current presentation as well. The patient denied current or recent suicidal ideation, intent, or plan and is not at acute risk for harm. Will continue to follow while hospitalized and assess the patient???s interest in establishing outpatient mental health follow-up.      DIAGNOSTIC IMPRESSIONS  Adjustment disorder, with anxiety   Depressive disorder, unspecified  Tobacco use disorder, severe (by history)  ALL    PLAN  RECOMMENDATIONS  1. CL Psychology will continue to follow the patient during medical hospitalization.  2. patient is being followed by CL Psychiatry for medication management.   3. Safety: No acute safety concerns.  Please see safety assessment for further discussion.  4. Will discuss interest in continuing mental health treatment through [facility] at follow-up.    SUBJECTIVE  SESSION CONTENT   At bedside, the patient was accompanied by his wife, who he agreed to remaining present for session. The patient provided overview of changes to health/mental health since his last hospitalization. Reported anxiety is currently exacerbated in the setting of pain, health-related stress, and being in the hospital. Denied suicidal ideation, intent, or plan. Offered supportive listening and emotional validation. The patient was amenable to psychotherapy while medically hospitalized to address coping with hospitalization.     OBJECTIVE   MENTAL STATUS     Orientation and consciousness: AOx4   Appearance and behavior: Stated age, calm, cooperative  Speech: Within normal limits  Language: Intact  Mood: Irritable  Affect: Constricted  Perceptual disturbance (hallucinations, illusions): None  Thought process and association: Linear and coherent  Thought content (delusions, obsessions etc.): None  Suicidal ideation/intent/plans: Denied  Homicidal ideation/intent/plans: Denied  Insight: Good  Judgement: Good  Memory: WNL, although not formally assessed    SAFETY:  Risk Assessment:  A suicide and violence risk assessment was performed as part of this evaluation. The patient is deemed to be at chronic elevated risk for self-harm/suicide given the following factors: recent loss, recent onset of serious medical condition, current diagnosis of depression and previous acts of self-harm. The patient is deemed to be at chronic elevated risk for violence given the following factors: male gender, recent loss and agitation. These risk factors are mitigated by the following factors:lack of active SI/HI, no know access to weapons or firearms,  no history of violence, motivation for treatment, supportive family, sense of responsibility to family and social supports, presence of a significant relationship, presence of an available support system, employment or functioning in a structured work/academic setting, enjoyment of leisure actvities, expresses purpose for living and safe housing. There is no acute risk for suicide or violence at this time. The patient was educated about relevant modifiable risk factors including following recommendations for treatment of psychiatric illness and abstaining from substance abuse. While future psychiatric events cannot be accurately predicted, the patient does not currently require  acute inpatient psychiatric care and does not currently meet [facility] involuntary commitment criteria.        EDUCATION/INTERVENTIONS:    -Completed mood check-in and reviewed the patient's current stressors and strategies to manage stress.  -Supportive therapy  -Discussed the patient???s treatment goals, revisited discussion of treatment options, and   explored goals of care.

## 2020-06-26 LAB — CBC W/ AUTO DIFF
BASOPHILS ABSOLUTE COUNT: 0.1 10*9/L (ref 0.0–0.1)
BASOPHILS RELATIVE PERCENT: 0.3 %
EOSINOPHILS ABSOLUTE COUNT: 0 10*9/L (ref 0.0–0.5)
EOSINOPHILS RELATIVE PERCENT: 0.1 %
HEMATOCRIT: 28.5 % — ABNORMAL LOW (ref 39.0–48.0)
HEMOGLOBIN: 9.9 g/dL — ABNORMAL LOW (ref 12.9–16.5)
LYMPHOCYTES ABSOLUTE COUNT: 1.2 10*9/L (ref 1.1–3.6)
LYMPHOCYTES RELATIVE PERCENT: 4.5 %
MEAN CORPUSCULAR HEMOGLOBIN CONC: 34.8 g/dL (ref 32.0–36.0)
MEAN CORPUSCULAR HEMOGLOBIN: 32 pg (ref 25.9–32.4)
MEAN CORPUSCULAR VOLUME: 91.8 fL (ref 77.6–95.7)
MEAN PLATELET VOLUME: 7.4 fL (ref 6.8–10.7)
MONOCYTES ABSOLUTE COUNT: 1.1 10*9/L — ABNORMAL HIGH (ref 0.3–0.8)
MONOCYTES RELATIVE PERCENT: 3.9 %
NEUTROPHILS ABSOLUTE COUNT: 25.1 10*9/L — ABNORMAL HIGH (ref 1.8–7.8)
NEUTROPHILS RELATIVE PERCENT: 91.2 %
PLATELET COUNT: 166 10*9/L (ref 150–450)
RED BLOOD CELL COUNT: 3.1 10*12/L — ABNORMAL LOW (ref 4.26–5.60)
RED CELL DISTRIBUTION WIDTH: 19.5 % — ABNORMAL HIGH (ref 12.2–15.2)
WBC ADJUSTED: 27.5 10*9/L — ABNORMAL HIGH (ref 3.6–11.2)

## 2020-06-26 LAB — HEPATIC FUNCTION PANEL
ALBUMIN: 3.3 g/dL — ABNORMAL LOW (ref 3.4–5.0)
ALKALINE PHOSPHATASE: 70 U/L (ref 46–116)
ALT (SGPT): 22 U/L (ref 10–49)
AST (SGOT): 18 U/L (ref <=34)
BILIRUBIN DIRECT: 0.1 mg/dL (ref 0.00–0.30)
BILIRUBIN TOTAL: 0.4 mg/dL (ref 0.3–1.2)
PROTEIN TOTAL: 5.9 g/dL (ref 5.7–8.2)

## 2020-06-26 LAB — BASIC METABOLIC PANEL
ANION GAP: 13 mmol/L (ref 5–14)
BLOOD UREA NITROGEN: 37 mg/dL — ABNORMAL HIGH (ref 9–23)
BUN / CREAT RATIO: 19
CALCIUM: 8.9 mg/dL (ref 8.7–10.4)
CHLORIDE: 108 mmol/L — ABNORMAL HIGH (ref 98–107)
CO2: 21 mmol/L (ref 20.0–31.0)
CREATININE: 1.94 mg/dL — ABNORMAL HIGH
EGFR CKD-EPI AA MALE: 49 mL/min/{1.73_m2} — ABNORMAL LOW (ref >=60)
EGFR CKD-EPI NON-AA MALE: 42 mL/min/{1.73_m2} — ABNORMAL LOW (ref >=60)
GLUCOSE RANDOM: 113 mg/dL (ref 70–179)
POTASSIUM: 5 mmol/L — ABNORMAL HIGH (ref 3.4–4.8)
SODIUM: 142 mmol/L (ref 135–145)

## 2020-06-26 LAB — C-REACTIVE PROTEIN: C-REACTIVE PROTEIN: <4 mg/L (ref <=10.0)

## 2020-06-26 LAB — PHOSPHORUS: PHOSPHORUS: 5.5 mg/dL — ABNORMAL HIGH (ref 2.4–5.1)

## 2020-06-26 LAB — MAGNESIUM: MAGNESIUM: 1.9 mg/dL (ref 1.6–2.6)

## 2020-06-26 MED ORDER — CLONAZEPAM 0.5 MG TABLET
ORAL_TABLET | Freq: Two times a day (BID) | ORAL | 0 refills | 7 days | Status: CP | PRN
Start: 2020-06-26 — End: 2020-07-03

## 2020-06-26 MED ORDER — ONDANSETRON HCL 8 MG TABLET
ORAL_TABLET | Freq: Three times a day (TID) | ORAL | 2 refills | 20 days | Status: CP | PRN
Start: 2020-06-26 — End: 2020-07-26
  Filled 2020-06-27: qty 60, 20d supply, fill #0

## 2020-06-26 MED ORDER — NORTRIPTYLINE 10 MG CAPSULE
ORAL_CAPSULE | Freq: Every evening | ORAL | 0 refills | 7.00000 days | Status: CP
Start: 2020-06-26 — End: 2020-07-03
  Filled 2020-06-27: qty 7, 7d supply, fill #0

## 2020-06-26 MED ORDER — OXYCODONE ER 30 MG TABLET,CRUSH RESISTANT,EXTENDED RELEASE 12 HR
ORAL_TABLET | Freq: Two times a day (BID) | ORAL | 0 refills | 7 days | Status: CP
Start: 2020-06-26 — End: ?
  Filled 2020-06-27: qty 14, 7d supply, fill #0

## 2020-06-26 MED ORDER — GABAPENTIN 300 MG CAPSULE
ORAL_CAPSULE | Freq: Three times a day (TID) | ORAL | 0 refills | 30 days | Status: CP
Start: 2020-06-26 — End: 2020-07-26
  Filled 2020-06-27: qty 90, 30d supply, fill #0

## 2020-06-26 MED ORDER — CLONAZEPAM 1 MG TABLET
ORAL_TABLET | Freq: Two times a day (BID) | ORAL | 0 refills | 7 days | Status: CP
Start: 2020-06-26 — End: 2020-07-03

## 2020-06-26 MED ORDER — HYDROXYZINE HCL 25 MG TABLET
ORAL_TABLET | Freq: Four times a day (QID) | ORAL | 1 refills | 15.00000 days | Status: CP | PRN
Start: 2020-06-26 — End: 2020-07-26
  Filled 2020-06-27: qty 60, 15d supply, fill #0

## 2020-06-26 MED ORDER — HYDROMORPHONE 2 MG TABLET
ORAL_TABLET | ORAL | 0 refills | 1.00000 days | Status: CP | PRN
Start: 2020-06-26 — End: 2020-07-01

## 2020-06-26 MED ADMIN — mirtazapine (REMERON) tablet 15 mg: 15 mg | ORAL

## 2020-06-26 MED ADMIN — HYDROmorphone (DILAUDID) injection 0.6 mg: .6 mg | INTRAVENOUS | @ 02:00:00 | Stop: 2020-07-06

## 2020-06-26 MED ADMIN — clonazePAM (KlonoPIN) tablet 0.5 mg: .5 mg | ORAL | @ 12:00:00

## 2020-06-26 MED ADMIN — carvediloL (COREG) tablet 12.5 mg: 12.5 mg | ORAL

## 2020-06-26 MED ADMIN — valACYclovir (VALTREX) tablet 500 mg: 500 mg | ORAL | @ 13:00:00

## 2020-06-26 MED ADMIN — PONATinib (ICLUSIG) tablet 30 mg **PATIENT SUPPLIED**: 30 mg | ORAL | @ 22:00:00

## 2020-06-26 MED ADMIN — gabapentin (NEURONTIN) capsule 200 mg: 200 mg | ORAL

## 2020-06-26 MED ADMIN — hydrOXYzine (ATARAX) tablet 25 mg: 25 mg | ORAL

## 2020-06-26 MED ADMIN — gabapentin (NEURONTIN) capsule 300 mg: 300 mg | ORAL | @ 13:00:00

## 2020-06-26 MED ADMIN — aspirin chewable tablet 81 mg: 81 mg | ORAL | @ 13:00:00

## 2020-06-26 MED ADMIN — HYDROmorphone (DILAUDID) injection 0.6 mg: .6 mg | INTRAVENOUS | @ 09:00:00 | Stop: 2020-06-26

## 2020-06-26 MED ADMIN — HYDROmorphone (DILAUDID) injection 0.6 mg: .6 mg | INTRAVENOUS | @ 05:00:00 | Stop: 2020-06-26

## 2020-06-26 MED ADMIN — clonazePAM (KlonoPIN) tablet 1 mg: 1 mg | ORAL | @ 13:00:00

## 2020-06-26 MED ADMIN — predniSONE (DELTASONE) tablet 50 mg: 50 mg | ORAL | @ 12:00:00 | Stop: 2020-06-26

## 2020-06-26 MED ADMIN — predniSONE (DELTASONE) tablet 40 mg: 40 mg | ORAL | @ 13:00:00 | Stop: 2020-07-03

## 2020-06-26 MED ADMIN — hydrOXYzine (ATARAX) tablet 25 mg: 25 mg | ORAL | @ 09:00:00

## 2020-06-26 MED ADMIN — clonazePAM (KlonoPIN) tablet 1 mg: 1 mg | ORAL | @ 01:00:00

## 2020-06-26 MED ADMIN — gabapentin (NEURONTIN) capsule 300 mg: 300 mg | ORAL | @ 18:00:00

## 2020-06-26 MED ADMIN — amLODIPine (NORVASC) tablet 10 mg: 10 mg | ORAL | @ 13:00:00

## 2020-06-26 MED ADMIN — blinatumomab (BLINCYTO) 28 mcg/day in sodium chloride 0.9% NON-PVC 0.9 % IVPB (24-HR INFUSION): 28 ug/d | INTRAVENOUS | @ 19:00:00 | Stop: 2020-06-27

## 2020-06-26 MED ADMIN — carvediloL (COREG) tablet 12.5 mg: 12.5 mg | ORAL | @ 13:00:00

## 2020-06-26 MED ADMIN — oxyCODONE (OXYCONTIN) 12 hr crush resistant ER/CR tablet 30 mg: 30 mg | ORAL | Stop: 2020-07-08

## 2020-06-26 MED ADMIN — oxyCODONE (OXYCONTIN) 12 hr crush resistant ER/CR tablet 30 mg: 30 mg | ORAL | @ 13:00:00 | Stop: 2020-07-08

## 2020-06-26 MED ADMIN — oxyCODONE (OXYCONTIN) 12 hr crush resistant ER/CR tablet 30 mg: 30 mg | ORAL | @ 15:00:00 | Stop: 2020-07-08

## 2020-06-26 NOTE — Unmapped (Addendum)
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    Plan Summary: C1D5=3/25 at 1500 of Blinatumomab, 28 mcg today + Ponatinib. ICANs 10 today. On Prednisone taper (see below) for DRESS. Continue Valtrex, Bactrim ppx. Psych following, on scheduled Klonopin. Discontinued Remeron and started Nortriptyline. Palliative care consulted. On OxyContin 30 mg BID with PO Dilaudid PRN and Gabapentin TID for skin pain. Outpatient referrals for palliative care/CCSP placed. Continue Amlodipine and Coreg daily. Further supportive care and chronic meds as noted below. Plan to discharge tomorrow morning.     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 The course was complicated by septic shock, candida krusei fungemia, MRSA bacteremia with septic emboli c/b acute renal failure and respiratory distress requiring dialysis and intubation. ??Post-induction bmbx (day 29) demonstrated flow-based??MRD-negative remission but low-level BCR-ABL persisted. Ultimately due to CKD, performance status, COVID infection and significant complications noted above cycle 2 of HyperCVAD was delayed and ultimately differed in favor of Blinatumomab. Last Bone marrow biopsy on 1/18 showed a normocellular marrow with less than 1% Blasts with 0.036% ratio for BCR/ABL.    - Valtrex, Bactrim ppx   - Ponatinib 30 mg daily   - ASA ppx   ??  Primary Oncologist: [name]  Chemotherapy: Blinatumomab (for MRD)  Cycle: 1 D1 = 3/18@ 2100, reaction x 2, plan as below started on 3/21  - Blinatumomab 9 mcg/day x 3 days (as inpatient)   - Blinatumomab 28 mcg/day x 2 days (as inpatient), then remainder of 28 day cycle outpatient.  - Plan to move timing of dose 6 to convenient time for home health bag changes  - Dexamethasone 20 mg day 1 and 4    [ ]  Assess for neuro toxicity - ICE score daily, include handwriting  [ ]  Assess for cytokine release syndrome  ??  Toxicities  - Frequent toxicities - pyrexia (60%), headache (34%), febrile neutropenia (28%), peripheral edema (26%), nausea (24%), hypokalemia (24%), constipation (21%), anemia (20%)  - 52% neurologic toxicity (mostly grade 1-2)  ??  Disposition   - Infusion appt on 3/26 at 12 for 48 hour bag change   - Infusion appt on 3/28 at 12 for 7 day bag change then weekly   - scheduled bag disconnection - scheduled for 4/18  - BMBx - scheduled on 4/21  - Follow up with [name] on 4/27    Reaction Log:   - 3/19 PM, Grade 1 reaction with upper torso erythema and temporal edema. Given complicated reaction with temporal edema, drug stopped. Patient was medicated with reaction meds. Drug restarted 3/20 after re-dosing with Dexamethasone.   - 3/20 PM, Grade 2 reaction with chest pain, anxiety, and generalized skin pain and itching. Drug stopped. On 3/21, dose reduced to 9 mcg as detailed above, with Dexamethasone pre-med.  - 3/21 PM - D1 of 9 mcg, plan x 3 day then increase to 28 mcg   ??  AKI to CKD: due to sepsis. Now s/p dialysis with stable Cr appears to have developed CKD due to incompletely healed acute injury.  - Dose medicines for creatinine clearance of approximately 30-35  ??  DRESS: ??Followed by  Dr. Caryn Section with [facility]. S/p punch biopsy on 3/11 and will need sutures removed 14 days after placement (3/25). He will continue prednisone taper and PRN hydroxyzine. Timing of onset occurred proximate to time when dose of dasatinib was being adjusted, posaconazole given and sotrovimab. ??Likely one of these changes was responsible for the DRESS.   - Prednisone taper    40mg  daily x 7 (3/25-3/31)   30mg  dalily x 7 (4/1- 4/7)    20mg  daily x 7 (4/8-4/14)    10 mg daily x 7 (4/15-4/21)  ??  History of Dyspnea, PND, edema, pleural effusion:??Likely due to dasatinib. Monitor after therapy change.  - No current symptoms of SOB  ??  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 Patient reports hypersensitivity of left hearing and difficulty with complex sounds and multiple conversations  - AVOID Lasix/loop diuretics, other ototoxic medications  ??  HTN: Chronic condition since diagnosis. Home regimen includes Amlodipine and Coreg.   - Amlodipine daily   - Coreg daily     Anxiety/Depression: Chronic condition. Was previously on scheduled Ativan. No longer taking. On Remeron 15 mg QHS. Anxiety worsened significantly since Blinatumomab initiation.  - Klonopin 1 mg PO BID scheduled and 0.5 mg PRN  - Discontinued Remeron 15 mg  at bedtime  - started Nortriptyline 10 mg daily x7 days then 25 mg nightly thereafter   ??  Chronic Back Pain: Complicated by prolonged hospital stays and procedures.     Skin pain/itching: Began with Blinatumomab initiation. Has been more responsive to IV pain meds than PO.  - Dilaudid 1-2 mg PO Q4HPRN for mod-severe pain  - Gabapentin 300 mg TID   - OxyContin 30 mg BID   - Atarax for itching as above  ??  History of Candidemia: Per [name] and Dr [name]??they feel that the risk of recurrent fungal infection is low if we can avoid neutropenia. No antifungal ppx at this time.  ??  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  Immunocompromised status: Patient is immunocompromised secondary  PH+ ALL disease and chemotherapy  - Antimicrobial prophylaxis as above    Nutrition:                        Subjective:   Afebrile, NAEON.Continues to have burning pain and really wants to go home.     10 point ROS otherwise negative except as above in the HPI.     Objective:     Vital signs in last 24 hours:  Temp:  [36 ??C (96.8 ??F)-36.9 ??C (98.4 ??F)] 36.1 ??C (96.9 ??F)  Heart Rate:  [76-98] 95  Resp: [16-20] 18  BP: (125-178)/(69-99) 178/99  MAP (mmHg):  [94-116] 110  SpO2:  [95 %-98 %] 98 %    Intake/Output last 3 shifts:  I/O last 3 completed shifts:  In: 990 [P.O.:720; I.V.:270]  Out: -     Meds:  Current Facility-Administered Medications   Medication Dose Route Frequency Provider Last Rate Last Admin   ??? amLODIPine (NORVASC) tablet 10 mg  10 mg Oral Daily Herbert Moors, AGNP   10 mg at 06/26/20 7564   ??? aspirin chewable tablet 81 mg  81 mg Oral Daily Maryanna Shape, [place]   81 mg at 06/26/20 0911   ??? blinatumomab (BLINCYTO) 28  mcg/day in sodium chloride 0.9% NON-PVC 0.9 % IVPB (24-HR INFUSION)  28 mcg/day Intravenous over 24 hr Providence Crosby Dittus, MD 10 mL/hr at 06/25/20 1504 32.5 mcg at 06/25/20 1504   ??? carvediloL (COREG) tablet 12.5 mg  12.5 mg Oral BID Herbert Moors, AGNP   12.5 mg at 06/26/20 0913   ??? CHEMO CLARIFICATION ORDER   Other Continuous PRN [place], PA       ??? CHEMO CLARIFICATION ORDER   Other Continuous PRN Army Chaco Vogler, AGNP       ??? clonazePAM (KlonoPIN) tablet 0.5 mg  0.5 mg Oral BID PRN Maryanna Shape, PA   0.5 mg at 06/26/20 0756   ??? clonazePAM (KlonoPIN) tablet 1 mg  1 mg Oral BID Maryanna Shape, PA   1 mg at 06/26/20 2956   ??? diphenhydrAMINE (BENADRYL) injection 25 mg  25 mg Intravenous Q4H PRN Guerry Bruin, MD   25 mg at 06/22/20 0104   ??? EPINEPHrine (EPIPEN) injection 0.3 mg  0.3 mg Intramuscular Daily PRN Guerry Bruin, MD       ??? famotidine (PF) (PEPCID) injection 20 mg  20 mg Intravenous Q4H PRN Guerry Bruin, MD   20 mg at 06/22/20 0104   ??? gabapentin (NEURONTIN) capsule 300 mg  300 mg Oral TID Maryanna Shape, PA   300 mg at 06/26/20 2130   ??? hydrocortisone 2.5 % cream 1 application  1 application Topical BID Maryanna Shape, [place]       ??? HYDROmorphone (DILAUDID) tablet 1 mg  1 mg Oral Q4H PRN Maryanna Shape, PA        Followed by   ??? [START ON 07/10/2020] HYDROmorphone (DILAUDID) tablet 2 mg  2 mg Oral Q4H PRN Maryanna Shape, PA       ??? hydrOXYzine (ATARAX) tablet 25 mg  25 mg Oral Q6H PRN Herbert Moors, AGNP   25 mg at 06/26/20 8657   ??? IP OKAY TO TREAT   Other Continuous PRN Herbert Moors, AGNP       ??? IP OKAY TO TREAT   Other Continuous PRN Ernie Hew, AGNP       ??? loperamide (IMODIUM) capsule 2 mg  2 mg Oral Q2H PRN Herbert Moors, AGNP       ??? loperamide (IMODIUM) capsule 4 mg  4 mg Oral Once PRN Herbert Moors, AGNP       ??? meperidine (DEMEROL) injection 25 mg  25 mg Intravenous Q30 Min PRN Guerry Bruin, MD       ??? methylPREDNISolone sodium succinate (PF) (Solu-MEDROL) injection 125 mg  125 mg Intravenous Q4H PRN Guerry Bruin, MD   125 mg at 06/22/20 0104   ??? mirtazapine (REMERON) tablet 15 mg  15 mg Oral Nightly Herbert Moors, AGNP   15 mg at 06/25/20 2025   ??? oxyCODONE (OXYCONTIN) 12 hr crush resistant ER/CR tablet 30 mg  30 mg Oral Q12H [facility] Maryanna Shape, [place]   10 mg at 06/26/20 1057   ??? PONATinib (ICLUSIG) tablet 30 mg **PATIENT SUPPLIED**  30 mg Oral Q24H Guerry Bruin, MD   30 mg at 06/25/20 1718   ??? predniSONE (DELTASONE) tablet 40 mg  40 mg Oral Daily Maryanna Shape, [place]   40 mg at 06/26/20 0912    Followed by   ??? [START ON 07/03/2020] predniSONE (DELTASONE) tablet 30 mg  30 mg Oral Daily Vernona Rieger  Kieth Brightly, PA        Followed by   ??? [START ON 07/10/2020] predniSONE (DELTASONE) tablet 20 mg  20 mg Oral Daily Maryanna Shape, PA        Followed by   ??? [START ON 07/17/2020] predniSONE (DELTASONE) tablet 10 mg  10 mg Oral Daily Maryanna Shape, PA       ??? prochlorperazine (COMPAZINE) injection 10 mg  10 mg Intravenous Q6H PRN Guerry Bruin, MD       ??? prochlorperazine (COMPAZINE) tablet 10 mg  10 mg Oral Q6H PRN Guerry Bruin, MD       ??? sodium chloride (NS) 0.9 % infusion  20 mL/hr Intravenous Continuous Herbert Moors, AGNP       ??? sodium chloride (NS) 0.9 % infusion 20 mL/hr Intravenous Continuous PRN Guerry Bruin, MD       ??? sodium chloride 0.9% (NS) bolus 1,000 mL  1,000 mL Intravenous Daily PRN Guerry Bruin, MD       ??? sulfamethoxazole-trimethoprim (BACTRIM DS) 800-160 mg tablet 160 mg of trimethoprim  1 tablet Oral BID Herbert Moors, AGNP   160 mg of trimethoprim at 06/21/20 2006   ??? triamcinolone (KENALOG) 0.1 % ointment   Topical BID Maryanna Shape, PA       ??? valACYclovir (VALTREX) tablet 500 mg  500 mg Oral Daily Herbert Moors, AGNP   500 mg at 06/26/20 4540       Physical Exam:  General: Anxious, in no acute distress, OOB to sofa.  HEENT:  PER. No scleral icterus or conjunctival injection. MMM.   Heart:  RRR. S1, S2. No murmurs, gallops, or rubs.  Lungs:  Breathing is unlabored, and patient is speaking full sentences with ease. CTAB.   Abdomen:  Nontender, nondistended. BS normoactive. No palpable masses.  Skin:  No rashes, petechiae or purpura.  No areas of skin breakdown. Warm to touch, dry, smooth, and even.  Musculoskeletal:  No grossly-evident joint effusions or deformities.    Psychiatric:  Range of affect is appropriate.    Neurologic:  Alert and oriented to person, place, time and situation. CNII-CNXII grossly intact. ICE 10/10.   Extremities:  Appear well-perfused. No clubbing, pitting edema, or cyanosis.  CVAD: R CW Port - no erythema, nontender; dressing CDI.      Labs:  Recent Labs     06/24/20  0605 06/25/20  0713 06/26/20  0611   WBC 23.7* 24.1* 27.5*   NEUTROABS 20.2* 20.6* 25.1*   LYMPHSABS 1.8 2.2 1.2   HGB 9.4* 9.8* 9.9*   HCT 27.8* 28.8* 28.5*   PLT 182 175 166   CREATININE 2.02* 1.88* 1.94*   BUN 49* 34* 37*   BILITOT 0.3 0.4 0.4   BILIDIR <0.10 0.10 0.10   AST 15 18 18    ALT 18 19 22    ALKPHOS 72 69 70   K 4.0 4.1 5.0*   MG 1.6 1.7 1.9   CALCIUM 8.6* 8.6* 8.9   NA 142 141 142   CL 109* 108* 108*   CO2 22.0 25.0 21.0   PHOS 4.5 4.7 5.5*   CRP <4.0 <4.0 <4.0       Imaging:  No new.    Irena Cords, PA-C 06/26/2020  12:50 PM   Physician Assistant   Hematology/Oncology Department   [facility]   Group Pager: [phone]     06/26/20

## 2020-06-26 NOTE — Unmapped (Signed)
Pt has issue with port needle and pump indicating downstream occlusion, see RN & CVAD Liason & NP notes for details. Issue resolved with port needle pressure and reinforcement and line support & securement. Port site closely monitored Q3.5hrs with no blood return checks and no forward flushing per chemotherapy protocol for Blinatumimab. Port site has no signs of infiltration or extravasation. Pt drowsy and oriented, easily irritable and agitated when alert, appropriate for situation as pt reports extreme burning pain spread over entire body. Pt receives PRN Dilaudid 0.6mg  Q3.5hrs per Rx. Pt reports diffuse itching and also receives PRN atarax twice. Pt sleeps thru most of the night. VSS. Pt reports no nausea, slight decreased appetite. Pt has no falls or other acute events. Pt has no new signs of infection.

## 2020-06-26 NOTE — Unmapped (Signed)
CVAD Liaison Consult    CVAD Liaison Nurse was consulted for port troubleshoot:    Patient is currently receiving a 24 hour chemotherapy through the port that was administered at 3PM today. Per primary RN, the IV pump started beeping downstream occlusion. The nurse wiggled the port needle (slightly) and the downstream occlusion beeping stopped. The nurse thinks that the port needle may have been slightly positional and is now infusing okay. Unfortunately, per chemo protocol, we are not allowed to flush or check blood return to ensure good patency and safe infusion of the chemotherapy. At this time, the only thing that CVAD Liaison nurse can recommend is checking blood return to ensure that the port needle is still in position properly. However, the nurse at this time is comfortable continuing the infusion, as the risk of exposure is too high to change the bag, tubing, port needle etc. I recommended at least letting a fellow know to be safe.     The primary RN will closely monitor the port site overnight. The patient will sleep with his t shirt off for closer assessment of the port site. Patient is also aware to notify RN if he starts to feel anything different around the port site.     Thank you for this consult,  Elizebeth Brooking RN    Consult Time 30 minutes (min)

## 2020-06-26 NOTE — Unmapped (Addendum)
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    Plan Summary: C1D4=3/24 at 1500 of Blinatumomab, 28 mcg today + Ponatinib. ICANs 10 today. On Prednisone taper (see below) for DRESS, suture removal on 3/25. Continue Valtrex, Bactrim ppx. Consulted Psych given worsening anxiety, increased scheduled Klonopin. Continues to have burning pain, on OxyContin 30 mg BID with IV Dilaudid and Gabapentin for skin pain. Continue Amlodipine and Coreg daily. Further supportive care and chronic meds as noted below.     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 The course was complicated by septic shock, candida krusei fungemia, MRSA bacteremia with septic emboli c/b acute renal failure and respiratory distress requiring dialysis and intubation. ??Post-induction bmbx (day 29) demonstrated flow-based??MRD-negative remission but low-level BCR-ABL persisted. Ultimately due to CKD, performance status, COVID infection and significant complications noted above cycle 2 of HyperCVAD was delayed and ultimately differed in favor of Blinatumomab. Last Bone marrow biopsy on 1/18 showed a normocellular marrow with less than 1% Blasts with 0.036% ratio for BCR/ABL.    - Valtrex, Bactrim ppx   - Ponatinib 30 mg daily   ??  Primary Oncologist: [name]  Chemotherapy: Blinatumomab (for MRD)  Cycle: 1 D1 = 3/18@ 2100, reaction x 2, plan as below started on 3/21  - Blinatumomab 9 mcg/day x 3 days (as inpatient)   - Blinatumomab 28 mcg/day x 2 days (as inpatient), then remainder of 28 day cycle outpatient.  - Plan to move timing of dose 6 to convenient time for home health bag changes  - Dexamethasone 20 mg day 1 and 4    [ ]  Assess for neuro toxicity - ICE score daily, include handwriting  [ ]  Assess for cytokine release syndrome  ??  Toxicities  - Frequent toxicities - pyrexia (60%), headache (34%), febrile neutropenia (28%), peripheral edema (26%), nausea (24%), hypokalemia (24%), constipation (21%), anemia (20%)  - 52% neurologic toxicity (mostly grade 1-2)  ??  Disposition   - Day prior to discharge, ensure nursing has instructions for discharge process for blinatumomab   - Bag changes at [facility] infusion due 3/21@1300  - will need to be changed pending tolerability of blinatumomab  - twice weekly lab checks, and CVAD dressing changes.   [ ]  F/U appointment in clinic after cycle +/- bone marrow biopsy     Reaction Log:   - 3/19 PM, Grade 1 reaction with upper torso erythema and temporal edema. Given complicated reaction with temporal edema, drug stopped. Patient was medicated with reaction meds. Drug restarted 3/20 after re-dosing with Dexamethasone.   - 3/20 PM, Grade 2 reaction with chest pain, anxiety, and generalized skin pain and itching. Drug stopped. On 3/21, dose reduced to 9 mcg as detailed above, with Dexamethasone pre-med.  - 3/21 PM - D1 of 9 mcg, plan x 3 day then increase to 28 mcg   ??  AKI to CKD: due to sepsis. Now s/p dialysis with stable Cr appears to have developed CKD due to incompletely healed acute injury.  - Dose medicines for creatinine clearance of approximately 30-35  ??  DRESS: ??Followed by Dr. Caryn Section with [facility]. S/p punch biopsy on  3/11 and will need sutures removed 14 days after placement (3/25). He will continue prednisone taper and PRN hydroxyzine. Timing of onset occurred proximate to time when dose of dasatinib was being adjusted, posaconazole given and sotrovimab. ??Likely one of these changes was responsible for the DRESS.   [ ]  Sutures will need to be removed ~3/25   - Prednisone taper 50mg  daily X7 day (3/18-3/25) then taper by 10mg  daily each week  ??  History of Dyspnea, PND, edema, pleural effusion:??Likely due to dasatinib. Monitor after therapy change.  - No current symptoms of SOB  ??  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 Patient reports hypersensitivity of left hearing and difficulty with complex sounds and multiple conversations  - AVOID Lasix/loop diuretics, other ototoxic medications  ??  HTN: Chronic condition since diagnosis. Home regimen includes Amlodipine and Coreg.   - Amlodipine daily   - Coreg daily     Anxiety/Depression: Chronic condition. Was previously on scheduled Ativan. No longer taking. On Remoron 15 mg QHS. Anxiety worsened significantly since Blinatumomab initiation.  - Klonopin 1 mg PO BID scheduled and 0.5 mg PRN  - Continue Remeron 15 mg  QHS  ??  Chronic Back Pain: Complicated by prolonged hospital stays and procedures.     Skin pain/itching: Began with Blinatumomab initiation. Has been more responsive to IV pain meds than PO.  - Dilaudid 0.5-1 mg IV Q4HPRN for mod-severe pain  - Gabapentin 100 mg TID   - OxyContin 30 mg BID   - Atarax for itching as above  ??  History of Candidemia: Per [name] and Dr [name]??they feel that the risk of recurrent fungal infection is low if we can avoid neutropenia. No antifungal ppx at this time.  ??  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  Immunocompromised status: Patient is immunocompromised secondary  PH+ ALL disease and chemotherapy  - Antimicrobial prophylaxis as above    Nutrition:                        Subjective:   Afebrile, NAEON. Continues to have burning skin pain that is not improved with dilaudid, however better able to tolerate discomfort.     10 point ROS otherwise negative except as above in the HPI.     Objective:     Vital signs in last 24 hours:  Temp:  [36 ??C (96.8 ??F)-36.7 ??C (98 ??F)] 36.1 ??C (97 ??F)  Heart Rate:  [65-97] 93  Resp:  [16-18] 16  BP: (122-147)/(61-91) 145/78  MAP (mmHg):  [84-112] 101  SpO2:  [95 %-99 %] 98 %    Intake/Output last 3 shifts:  I/O last 3 completed shifts:  In: 480 [P.O.:480]  Out: -     Meds:  Current Facility-Administered Medications   Medication Dose Route Frequency Provider Last Rate Last Admin   ??? amLODIPine (NORVASC) tablet 10 mg  10 mg Oral Daily Herbert Moors, AGNP   10 mg at 06/25/20 0920   ??? aspirin chewable tablet 81 mg  81 mg Oral Daily Maryanna Shape, PA   81 mg at 06/25/20 1410   ??? blinatumomab (BLINCYTO) 28 mcg/day in sodium chloride 0.9% NON-PVC 0.9 % IVPB (24-HR INFUSION)  28 mcg/day Intravenous over 24 hr Providence Crosby Dittus, MD 10 mL/hr at 06/25/20 1504  32.5 mcg at 06/25/20 1504   ??? carvediloL (COREG) tablet 12.5 mg  12.5 mg Oral BID Herbert Moors, AGNP   12.5 mg at 06/25/20 0920   ??? CHEMO CLARIFICATION ORDER   Other Continuous PRN Court Joy Churchwell, PA       ??? CHEMO CLARIFICATION ORDER   Other Continuous PRN Army Chaco Vogler, AGNP       ??? clonazePAM (KlonoPIN) tablet 0.5 mg  0.5 mg Oral BID PRN Maryanna Shape, PA       ??? clonazePAM (KlonoPIN) tablet 1 mg  1 mg Oral BID Maryanna Shape, PA   1 mg at 06/25/20 0920   ??? diphenhydrAMINE (BENADRYL) injection 25 mg  25 mg Intravenous Q4H PRN Guerry Bruin, MD   25 mg at 06/22/20 0104   ??? EPINEPHrine (EPIPEN) injection 0.3 mg  0.3 mg Intramuscular Daily PRN Guerry Bruin, MD       ??? famotidine (PF) (PEPCID) injection 20 mg  20 mg Intravenous Q4H PRN Guerry Bruin, MD   20 mg at 06/22/20 0104   ??? gabapentin (NEURONTIN) capsule 200 mg  200 mg Oral TID Maryanna Shape, PA   200 mg at 06/25/20 1410   ??? hydrocortisone 2.5 % cream 1 application  1 application Topical BID Maryanna Shape, [place]       ??? HYDROmorphone (DILAUDID) injection 0.2 mg  0.2 mg Intravenous Q4H PRN Maryanna Shape, PA        Or   ??? HYDROmorphone (DILAUDID) injection 0.6 mg  0.6 mg Intravenous Q4H PRN Maryanna Shape, PA   0.6 mg at 06/25/20 1821 ??? hydrOXYzine (ATARAX) tablet 25 mg  25 mg Oral Q6H PRN Herbert Moors, AGNP   25 mg at 06/25/20 1410   ??? IP OKAY TO TREAT   Other Continuous PRN Herbert Moors, AGNP       ??? IP OKAY TO TREAT   Other Continuous PRN Ernie Hew, AGNP       ??? loperamide (IMODIUM) capsule 2 mg  2 mg Oral Q2H PRN Herbert Moors, AGNP       ??? loperamide (IMODIUM) capsule 4 mg  4 mg Oral Once PRN Herbert Moors, AGNP       ??? meperidine (DEMEROL) injection 25 mg  25 mg Intravenous Q30 Min PRN Guerry Bruin, MD       ??? methylPREDNISolone sodium succinate (PF) (Solu-MEDROL) injection 125 mg  125 mg Intravenous Q4H PRN Guerry Bruin, MD   125 mg at 06/22/20 0104   ??? mirtazapine (REMERON) tablet 15 mg  15 mg Oral Nightly Herbert Moors, AGNP   15 mg at 06/24/20 2009   ??? oxyCODONE (OXYCONTIN) 12 hr crush resistant ER/CR tablet 30 mg  30 mg Oral Q12H [facility] Maryanna Shape, [place]       ??? PONATinib (ICLUSIG) tablet 30 mg **PATIENT SUPPLIED**  30 mg Oral Q24H Guerry Bruin, MD   30 mg at 06/25/20 1718   ??? predniSONE (DELTASONE) tablet 50 mg  50 mg Oral Daily Herbert Moors, AGNP   50 mg at 06/25/20 0920   ??? prochlorperazine (COMPAZINE) injection 10 mg  10 mg Intravenous Q6H PRN Guerry Bruin, MD       ??? prochlorperazine (COMPAZINE) tablet 10 mg  10 mg Oral Q6H PRN Guerry Bruin, MD       ??? sodium chloride (NS) 0.9 % infusion  20 mL/hr Intravenous Continuous  Herbert Moors, AGNP       ??? sodium chloride (NS) 0.9 % infusion  20 mL/hr Intravenous Continuous PRN Guerry Bruin, MD       ??? sodium chloride 0.9% (NS) bolus 1,000 mL  1,000 mL Intravenous Daily PRN Guerry Bruin, MD       ??? sulfamethoxazole-trimethoprim (BACTRIM DS) 800-160 mg tablet 160 mg of trimethoprim  1 tablet Oral BID Herbert Moors, AGNP   160 mg of trimethoprim at 06/21/20 2006   ??? triamcinolone (KENALOG) 0.1 % ointment   Topical BID Maryanna Shape, PA       ??? valACYclovir (VALTREX) tablet 500 mg  500 mg Oral Daily Herbert Moors, AGNP   500 mg at 06/25/20 0920       Physical Exam:  General: Anxious, in no acute distress, OOB to sofa.  HEENT:  PER. No scleral icterus or conjunctival injection. MMM.   Heart:  RRR. S1, S2. No murmurs, gallops, or rubs.  Lungs:  Breathing is unlabored, and patient is speaking full sentences with ease. CTAB.   Abdomen:  Nontender, nondistended. BS normoactive. No palpable masses.  Skin:  No rashes, petechiae or purpura.  No areas of skin breakdown. Warm to touch, dry, smooth, and even.  Musculoskeletal:  No grossly-evident joint effusions or deformities.    Psychiatric:  Range of affect is appropriate.    Neurologic:  Alert and oriented to person, place, time and situation. CNII-CNXII grossly intact. ICE 10/10.   Extremities:  Appear well-perfused. No clubbing, pitting edema, or cyanosis.  CVAD: R CW Port - no erythema, nontender; dressing CDI.      Labs:  Recent Labs     06/23/20  0548 06/24/20  0605 06/25/20  0713   WBC 26.7* 23.7* 24.1*   NEUTROABS 23.9* 20.2* 20.6*   LYMPHSABS 0.9* 1.8 2.2   HGB 8.7* 9.4* 9.8*   HCT 25.5* 27.8* 28.8*   PLT 198 182 175   CREATININE 1.89* 2.02* 1.88*   BUN 33* 49* 34*   BILITOT 0.3 0.3 0.4   BILIDIR <0.10 <0.10 0.10   AST 15 15 18    ALT 15 18 19    ALKPHOS 65 72 69   K 4.3 4.0 4.1   MG 1.6 1.6 1.7   CALCIUM 8.6* 8.6* 8.6*   NA 141 142 141   CL 108* 109* 108*   CO2 24.0 22.0 25.0   PHOS 4.7 4.5 4.7   CRP <4.0 <4.0 <4.0       Imaging:  No new.    Irena Cords, PA-C  06/25/2020  7:01 PM   Physician Assistant   Hematology/Oncology Department   [facility]   Group Pager: [phone]     06/25/20

## 2020-06-26 NOTE — Unmapped (Signed)
[facility]  Follow-Up Psychiatry Consult Note     Service Date: June 26, 2020  LOS:  LOS: 7 days      Assessment:   Adam [name] with pertinent PMH of PH+ B cell ALL (diagnosed 01/20/20), adjustment disorder, anxiety, PTSD admitted 06/19/2020  1:23 PM for C1 Blinatumomab.  Patient was seen in consultation by Psychiatry at the request of Providence Crosby Dittu* with Oncology/Hematology ([facility]) for evaluation of Anxiety and Medication recommendations.     Patient's previous hospital course was notable for several medical complications. During his hospitalization in January 2022, patient was amenable to psychotherapy with Dr. Lanae Boast to address coping with mood symptoms and demoralization. Over the course of that therapy, patient's mood improved significantly.    The patient's current presentation of persistent worried behavior and increased agitation towards leaving AMA in the setting of current frustration with chemotherapy treatment  is most consistent with adjustment disorder with anxious mood. These symptoms are exacerbated by patient's previous history of PTSD and recent death of a family member. Patient expresses strong desire to try medication that will help decrease anxiety regarding current situation. At this time, plan to adjust klonopin dose as detailed below. Additionally, we recommend follow-up with Dr. Lanae Boast for supportive psychotherapy throughout patient's hospitalization.     Today, more alert, frustrated with hospital rules and wanting to go home. Discussed potential med changes which patient is not interested in at this time. Recommend outpatient therapy/medication management through [facility].     Please see below for detailed recommendations.    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    Safety Risk Assessment:  A suicide and violence risk assessment was performed as part of this evaluation. Risk factors for self-harm/suicide: recent onset of serious medical condition.  Protective factors against self-harm/suicide:  lack of active SI, no history of previous suicide attempts , motivation for treatment, supportive family, sense of responsibility to family and social supports, minor children living at home, presence of a significant relationship, presence of an available support system, enjoyment of leisure actvities, expresses purpose for living, current treatment compliance and safe housing.  Risk factors for harm to others: N/A.  Protective factors against harm to others: no active symptoms of psychosis, no active symptoms of mania and connectedness to family.  While future psychiatric events cannot be accurately predicted, the patient is not currently at elevated acute risk, and is not at elevated chronic risk of harm to self and is not currently at elevated acute risk, and is not at elevated chronic risk of harm to others.       Recommendations:   ## Safety:   -- No acute safety concerns.  Please see safety assessment for further discussion.    ## Medications:   -- Continue Klonopin 1 mg PO BID   -- Continue PRN Klonopin 0.5 mg PO BID  -- Continue mirtazapine 15 mg PO nightly     ## Medical Decision Making Capacity:   -- A formal capacity assessement was not performed as a part of this evaluation.  If specific capacity questions arise, please contact our team as below.     ## Further Work-up:   -- No recommendations at this time.    ## Disposition:   --  The patient will follow-up with ACT team for mental health care at the time of discharge.    ## Behavioral / Environmental:   -- Utilize compassion and acknowledge the patient's experiences while setting clear and realistic expectations for care.     Thank you for this consult request. Recommendations have been communicated to the primary team.  We will follow as needed at this time. Please page [phone] for any questions or concerns.     This patient was evaluated in person.    Discussed with and seen by Attending, Voncille Lo, MD, who agrees with the assessment and plan.    Janora Norlander, MD    I saw and evaluated the patient, participating in the key portions of the service.  Follow up can be with the [facility].  I reviewed the resident???s note.  I agree with the resident???s findings and plan.     Hubert Azure, MD     Interval History:   Relevant Aspects of Hospital Course:   Admitted on 06/19/2020 for C1 Blinatumomab treatments. Has had two separate reactions to blinatumomab including 3/19 PM, Grade 1 reaction with upper torso erythema and temporal edema. Given complicated reaction with temporal edema, drug stopped. Patient was medicated with reaction meds. Drug restarted 3/20 after re-dosing with Dexamethasone. 3/20 PM, Grade 2 reaction with chest pain, anxiety, and generalized skin pain and itching. Drug stopped. On 3/21, dose reduced to 9 mcg as detailed above, with Dexamethasone pre-med. Currently on prednisone taper for DRESS.     NAEO.    Patient Report:   Patient seen laying on his side in bed, currently receiving blinatumumab infusion. Patient's father at bedside. Patient expressing frustration about hospital menu and food and requests MD pick something for him, I was here 5 months it's all shit. Would like to go to the cafeteria to see the food himself but he says he is not allowed. Thinks about leaving AMA but is trying to be patient, kind, and understanding. Discussed his desire to work on his boat. He enjoys fishing as well. Says he is in pain this morning. Didn't feel like he slept well because he was up every hour. When asked about his anxiety level he says, it's hard for me to answer because I don't know how I'm supposed to feel. Does note that even though he is not a medications person he cries less when he is on medication. When discussing medication changes he says he is not interested unless this MD is the one who will be working with him long term, I only want to talk to one person, that's it.     ROS:   All systems reviewed as negative/unremarkable aside from the following pertinent positives and negatives: pain due to infusion.     Collateral information:   - Reviewed medical records in Epic    Psychiatric, medical, surgical, social history updated as indicated       Medications:     Current Facility-Administered Medications:     amLODIPine (NORVASC) tablet 10 mg, 10 mg, Oral, Daily, Herbert Moors, AGNP, 10 mg at 06/26/20 4540    aspirin chewable tablet 81 mg, 81 mg, Oral, Daily, Maryanna Shape, PA, 81 mg at 06/26/20 0911    blinatumomab (BLINCYTO) 28 mcg/day in sodium chloride 0.9% NON-PVC 0.9 % IVPB (24-HR INFUSION), 28 mcg/day, Intravenous, over 24 hr, Providence Crosby Dittus, MD, Last Rate: 10 mL/hr at 06/25/20 1504, 32.5 mcg at 06/25/20 1504    carvediloL (COREG)  tablet 12.5 mg, 12.5 mg, Oral, BID, Herbert Moors, AGNP, 12.5 mg at 06/26/20 0913    CHEMO CLARIFICATION ORDER, , Other, Continuous PRN, Court Joy Churchwell, PA    CHEMO CLARIFICATION ORDER, , Other, Continuous PRN, Army Chaco Vogler, AGNP    clonazePAM (KlonoPIN) tablet 0.5 mg, 0.5 mg, Oral, BID PRN, Maryanna Shape, PA, 0.5 mg at 06/26/20 0756    clonazePAM (KlonoPIN) tablet 1 mg, 1 mg, Oral, BID, Maryanna Shape, PA, 1 mg at 06/26/20 1610    diphenhydrAMINE (BENADRYL) injection 25 mg, 25 mg, Intravenous, Q4H PRN, Guerry Bruin, MD, 25 mg at 06/22/20 0104    EPINEPHrine [facility]) injection 0.3 mg, 0.3 mg, Intramuscular, Daily PRN, Guerry Bruin, MD    famotidine (PF) (PEPCID) injection 20 mg, 20 mg, Intravenous, Q4H PRN, Guerry Bruin, MD, 20 mg at 06/22/20 0104    gabapentin (NEURONTIN) capsule 300 mg, 300 mg, Oral, TID, Maryanna Shape, PA, 300 mg at 06/26/20 9604    hydrocortisone 2.5 % cream 1 application, 1 application, Topical, BID, Maryanna Shape, PA    HYDROmorphone (DILAUDID) tablet 1 mg, 1 mg, Oral, Q4H PRN **FOLLOWED BY** [START ON 07/10/2020] HYDROmorphone (DILAUDID) tablet 2 mg, 2 mg, Oral, Q4H PRN, Maryanna Shape, PA    hydrOXYzine (ATARAX) tablet 25 mg, 25 mg, Oral, Q6H PRN, Herbert Moors, AGNP, 25 mg at 06/26/20 5409    IP OKAY TO TREAT, , Other, Continuous PRN, Herbert Moors, AGNP    IP OKAY TO TREAT, , Other, Continuous PRN, Army Chaco Vogler, AGNP    loperamide (IMODIUM) capsule 2 mg, 2 mg, Oral, Q2H PRN, Herbert Moors, AGNP    loperamide (IMODIUM) capsule 4 mg, 4 mg, Oral, Once PRN, Herbert Moors, AGNP    meperidine (DEMEROL) injection 25 mg, 25 mg, Intravenous, Q30 Min PRN, Guerry Bruin, MD    methylPREDNISolone sodium succinate (PF) (Solu-MEDROL) injection 125 mg, 125 mg, Intravenous, Q4H PRN, Guerry Bruin, MD, 125 mg at 06/22/20 0104    mirtazapine (REMERON) tablet 15 mg, 15 mg, Oral, Nightly, Herbert Moors, AGNP, 15 mg at 06/25/20 2025    oxyCODONE (OXYCONTIN) 12 hr crush resistant ER/CR tablet 30 mg, 30 mg, Oral, Q12H SCH, Maryanna Shape, PA, 10 mg at 06/26/20 1057    PONATinib (ICLUSIG) tablet 30 mg **PATIENT SUPPLIED**, 30 mg, Oral, Q24H, Guerry Bruin, MD, 30 mg at 06/25/20 1718    predniSONE (DELTASONE) tablet 40 mg, 40 mg, Oral, Daily, 40 mg at 06/26/20 0912 **FOLLOWED BY** [START ON 07/03/2020] predniSONE (DELTASONE) tablet 30 mg, 30 mg, Oral, Daily **FOLLOWED BY** [START ON 07/10/2020] predniSONE (DELTASONE) tablet 20 mg, 20 mg, Oral, Daily **FOLLOWED BY** [START ON 07/17/2020] predniSONE (DELTASONE) tablet 10 mg, 10 mg, Oral, Daily, Maryanna Shape, PA    prochlorperazine (COMPAZINE) injection 10 mg, 10 mg, Intravenous, Q6H PRN, Guerry Bruin, MD    prochlorperazine (COMPAZINE) tablet 10 mg, 10 mg, Oral, Q6H PRN, Guerry Bruin, MD    sodium chloride (NS) 0.9 % infusion, 20 mL/hr, Intravenous, Continuous, Herbert Moors, AGNP    sodium chloride (NS) 0.9 % infusion, 20 mL/hr, Intravenous, Continuous PRN, Guerry Bruin, MD    sodium chloride 0.9% (NS) bolus 1,000 mL, 1,000 mL, Intravenous, Daily PRN, Guerry Bruin, MD    sulfamethoxazole-trimethoprim (BACTRIM DS) 800-160 mg tablet 160 mg of trimethoprim, 1 tablet, Oral, BID, Herbert Moors, AGNP, 160 mg of trimethoprim at 06/21/20 2006  triamcinolone (KENALOG) 0.1 % ointment, , Topical, BID, Maryanna Shape, PA    valACYclovir (VALTREX) tablet 500 mg, 500 mg, Oral, Daily, Herbert Moors, [place], 500 mg at 06/26/20 1610    Allergies:  Allergies   Allergen Reactions    Bupropion Hcl Other (See Comments)     Per patient out of touch with reality, suicidal, homicidal    Dapsone Other (See Comments) and Anaphylaxis     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    Onion Anaphylaxis    Vancomycin Analogues      Hearing loss with Lasix  Other reaction(s): Other (See Comments)  Hearing loss  lasix    Bismuth Subsalicylate Nausea And Vomiting    Ceftaroline Fosamil Rash and Other (See Comments)     Rash X 2 and pancytopenia 02/2020    Furosemide      With Vancomycin caused hearing loss  Other reaction(s): Other (See Comments)  With Vancomycin caused hearing loss       Objective:   Vital signs:   Temp:  [36 ??C-36.9 ??C] 36.1 ??C  Heart Rate:  [76-98] 95  Resp:  [16-20] 18  BP: (125-178)/(69-99) 178/99  MAP (mmHg):  [94-116] 110  SpO2:  [95 %-98 %] 98 %    Physical Exam:  Gen: No acute distress.  Pulm: Normal work of breathing.  Neuro/MSK: Normal bulk/tone. Gait/station deferred given patient lying in bed.  Skin: pale.    Mental Status Exam:  Appearance:  appears stated age and lying in bed, chronically ill appearing   Attitude:   calm, cooperative and polite   Behavior/Psychomotor:  appropriate eye contact and no abnormal movements   Speech/Language:   normal rate, volume, tone, fluency and language intact, well formed   Mood:  ???I'm trying to stay patient   Affect:   Mildly irritable   Thought process:  logical, linear, clear, coherent, goal directed   Thought content:     No endorsement of suicidal or homicidal ideation   Perceptual disturbances:   behavior not concerning for response to internal stimuli   Attention:  able to fully attend without fluctuations in consciousness   Concentration:  Able to fully concentrate and attend   Orientation:  grossly oriented.   Memory:  not formally tested, but grossly intact   Fund of knowledge:   consistent with level of education and development   Insight:    Intact   Judgment:   Fair   Impulse Control:  Intact       Data Reviewed:  I reviewed labs from the last 24 hours.     Additional Psychometric Testing:  Not applicable.

## 2020-06-26 NOTE — Unmapped (Signed)
Recreational Therapy Evaluation  06/26/2020     Patient Name:  Adam Keith       Medical Record Number: [id number]   Date of Birth: Dec 05, 1980  Sex: Male          Room/Bed:  4838/4838-01    Eval Duration: 5 Min.    Assessment  Adam 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 LRT met with pt on 06/26/20 to orient pt to RT tx services, addressing goals for admission, current level of function and adjustment to hospitalization. Pt declining services at this time reporting ???I appreciate you coming by but I???m not interested at all???. Will d/c RT tx services at this time as pt is declining services. LRT will be available for additional resources it needed however should situation change, please re-consult RT tx services during LOS.     Plan of Care  Patient Declines Services for: D/C Services   Planned Treatment Duration: Patient Declines Services  Subjective:    Current Situation: Pt received lying in bed in room upon LRT arrival; family/friend at bedside  Cognitive, Emotional, Physical, Social, and Leisure/Life functioning were assessed:: Patient Interviews, Review of Chart, Observation in Activities/Interventions, Treatment Team  Employment: Unemployed  Living Environment: House  Lives With: Spouse  Equipment / Environment: Patient not wearing mask for full session, Caregiver wearing mask for full session (LRT donned eye protection)    Past Medical History:   Diagnosis Date   ??? 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 Smoking status: Former Smoker     Packs/day: 2.00     Types: Cigarettes     Quit date: 01/20/2020     Years since quitting: 0.4   ??? Smokeless tobacco: Former Neurosurgeon     Types: Chew   Substance Use Topics   ??? Alcohol use: Not on file      Past Surgical History:   Procedure Laterality Date   ??? BONE MARROW BIOPSY & ASPIRATION  01/21/2020        ??? IR INSERT PORT AGE GREATER THAN 5 YRS  03/10/2020    IR INSERT PORT AGE GREATER THAN 5 YRS 03/10/2020 [name], MD IMG VIR H&V [facility]    Family History   Problem Relation Age of Onset   ??? Melanoma Neg Hx    ??? Basal cell carcinoma Neg Hx    ??? Squamous cell carcinoma Neg Hx         Allergies: Bupropion hcl, Dapsone, Onion, Vancomycin analogues, Bismuth subsalicylate, Ceftaroline fosamil, and Furosemide     Objective:  Emotional  Mood: Euthymic  Affect: Congruent      I attest that I have reviewed the above information.  Signed by Lynnell Grain Louella Medaglia LRT/CTRS   Filed 06/26/2020

## 2020-06-26 NOTE — Unmapped (Signed)
Palliative Care Consult Note    Consultation from Requesting Attending Physician:  Providence Crosby Dittu*  Service Requesting Consult:  Oncology/Hematology ([facility])  Reason for Consult Request from Attending Physician:  Evaluation of Symptoms  Primary Care Provider:  [name], MD    Assessment/Plan:      SUMMARY:  This 40 y.o. patient is seriously ill due to Ph+ B-ALL, complicated by co-morbid acute and chronic conditions including HTN, CKD and chronic pain.    Symptom Assessment and Recommendations:    #Neuropathic Pain associated with pruritus- unclear etiology but considerations include long COVID sequelae vs medication associated. Possible contribution of opioid-induced pruritus. This is in the setting of significant anxiety being managed by psychiatry. Given overlap in managing symptoms, will consider tricyclic antidepressant trial in addition to current regimen.  - Agree with increasing gabapentin to 300mg  TID, will take weeks to determine efficacy.  - Discussed with psychiatry and if patient is amenable, start nortriptyline 10mg  nightly in place of mirtazipine 15mg  nightly.   - If started, will need to titrate nortriptyline to 25mg  after 1 week. Will need EKG prior to uptitration.   - Continue oxycontin 30mg  BID  - Continue dilaudid 1-2mg  every 4 hours as needed for breakthrough pain  - Recommend zofran 8mg  every 8 hour as needed if pruritis uncontrolled after above (can be helpful with component of opioid associated pruritus)  - Can consider further diagnostic workup on outpatient basis, if symptoms are difficult to manage  - Place referral for [facility] and [facility] for outpatient follow up    Prognosis and Understanding:     Prognosis: unknown    Family prognostic understanding: not discussed    Goals of Care and Decision Making:     Decisional capacity at time of visit: full    Decision Maker if lacks capacity: Name:   HCDM (patient stated preference): Bonnet, [name] - Spouse - [phone] Advance Directive: no    Code status:   Code Status: Full Code     Goals of care: Life-prolonging/ no limitations    Practical, Emotional, Spiritual Support / Other Communication and Counseling:    - Acknowledged and empathized with frustrations around pain and admission  - Communicated recommendations to team    Thank you for this consult. Please page  [facility]  or Palliative Care [phone]) if there are any questions.     Arnaldo Natal, MD  [facility]    Subjective:     HPI:  Adam [name] with HTN, recent COVID infection, DRESS, Ph+B-ALL s/p induction c/b septic shock, fungemia, bacteremia, peripheral neuropathy and acute kidney injury, now with CKD, who was admitted for continuation of of therapy (blinatumomab/ponatinib). His stay has been complicated by worsening anxiety and full body/skin pain. We are being consulted regarding pain management.     Per chart review, he was diagnosed with COVID infection 05/28/20, and admitted between 2/27-28 for management (monoclonal antibody- sotrovimab) and observation. After discharge, he noted progressive generalized body rash (that began before his admission) associated with pruritus and pain. He was evaluated by Dermatology and diagnosed with DRESS and started on prednisone with a taper, with improvement of the rash but with progressive pruritus. On admission, he was continued on the steroid taper, with IV dilaudid prn for pain management. He has been continued on his home oxycontin 30mg  BID, and has receive between 4-6mg  of IV dilaudid daily. His gabapentin was increased to 300mg  TID today. He has been evaluated  by dermatology and his symptoms are not attributable to recurrent DRESS. He has also been evaluated with Psychiatry with plan for psychotherapy OP and med optimization (currently on mirtazipine and clonazepam).    He reports gradual progression of the whole body pain over the past few weeks- he describes the pain as burning from between his eyes to between his toes, and rates it currently at 8/10. He feels that the pain is worse at night and will be associated with sweating and then sever pruritus that he has broken skin scratching. He also notes that flannel bedding worsen intensity of symptoms. He feels that IV dilaudid works well for the pain and that oral form work but take time to kick in. He denies associated rash.   He denies chest pain, SOB, abdominal pain, nausea, vomiting, diarrhea, constipation. He is very anxious and frustrated with being admitted and would like to go home as soon as able.     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    Onion Anaphylaxis    Vancomycin Analogues      Hearing loss with Lasix  Other reaction(s): Other (See Comments)  Hearing loss  lasix    Bismuth Subsalicylate Nausea And Vomiting    Ceftaroline Fosamil Rash and Other (See Comments)     Rash X 2 and pancytopenia 02/2020    Furosemide      With Vancomycin caused hearing loss  Other reaction(s): Other (See Comments)  With Vancomycin caused hearing loss     Medications:  Scheduled Meds:   amLODIPine  10 mg Oral Daily    aspirin  81 mg Oral Daily    blinatumomab (BLINCYTO) 15 mcg/m2/day (max 28 mcg/day) 24-hr infusion  28 mcg/day Intravenous over 24 hr    carvediloL  12.5 mg Oral BID    clonazePAM  1 mg Oral BID    gabapentin  300 mg Oral TID    hydrocortisone  1 application Topical BID    mirtazapine  15 mg Oral Nightly    oxyCODONE 30 mg Oral Q12H SCH    PONATinib  30 mg Oral Q24H    predniSONE  40 mg Oral Daily    Followed by    Melene Muller ON 07/03/2020] predniSONE  30 mg Oral Daily    Followed by    Melene Muller ON 07/10/2020] predniSONE  20 mg Oral Daily    Followed by    Melene Muller ON 07/17/2020] predniSONE  10 mg Oral Daily    sulfamethoxazole-trimethoprim  1 tablet Oral BID    triamcinolone   Topical BID    valACYclovir  500 mg Oral Daily     Continuous Infusions:   Chemo Clarification Order      Chemo Clarification Order      IP okay to treat      IP okay to treat      sodium chloride      sodium chloride       PRN Meds:.Chemo Clarification  Order, Chemo Clarification Order, clonazePAM, diphenhydrAMINE, EPINEPHrine IM, famotidine (PEPCID) IV, HYDROmorphone **FOLLOWED BY** [START ON 07/10/2020] HYDROmorphone, hydrOXYzine, IP okay to treat, IP okay to treat, loperamide, loperamide, meperidine, methylPREDNISolone sodium succinate (PF), prochlorperazine, prochlorperazine, sodium chloride, sodium chloride 0.9%     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  Relevant family history includes N/A    Review of Systems:  A 12 system review of systems was negative except as noted in HPI.      Objective:   Function:  70% - Ambulation: Reduced / unable to do normal work, some evidence of disease / Self-Care: Full / Intake: Normal or reduced / Level of Conscious: Full    Temp:  [36 ??C (96.8 ??F)-36.9 ??C (98.4 ??F)] 36.5 ??C (97.7 ??F)  Heart Rate:  [76-98] 80  Resp:  [16-20] 18  BP: (122-159)/(61-89) 159/89  SpO2:  [95 %-98 %] 98 %    No intake/output data recorded.    Physical Exam:  Constitutional: awake, drowsy, in NAD   Eyes: anicteric sclera, no discharge  ENMT: moist oral mucosa  Pulm: normal effort of breathing, good flow of air to bases bilaterally   CV: normal rate, regular rhythm, no appreciable murmurs  Abd: +BS, soft, non-distended, non-tender to palpation  MSK: no appreciable sarcopenia, no LE edema  Skin: no appreciable rashes/lesion  Neuro: cognitive status intact, muscle strength deferred  Psych: mood and affect anxious    Test Results:  Lab Results   Component Value Date    WBC 27.5 (H) 06/26/2020    RBC 3.10 (L) 06/26/2020    HGB 9.9 (L) 06/26/2020    HCT 28.5 (L) 06/26/2020    MCV 91.8 06/26/2020    MCH 32.0 06/26/2020    MCHC 34.8 06/26/2020    RDW 19.5 (H) 06/26/2020    PLT 166 06/26/2020    MPV 7.4 06/26/2020     Lab Results   Component Value Date    NA 142 06/26/2020    K 5.0 (H) 06/26/2020    CL 108 (H) 06/26/2020    CO2 21.0 06/26/2020    BUN 37 (H) 06/26/2020    CREATININE 1.94 (H) 06/26/2020    GLU 113 06/26/2020    CALCIUM 8.9 06/26/2020    ALBUMIN 3.3 (L) 06/26/2020    PHOS 5.5 (H) 06/26/2020      Lab Results   Component Value Date    ALKPHOS 70 06/26/2020    BILITOT 0.4 06/26/2020    BILIDIR 0.10 06/26/2020    PROT 5.9 06/26/2020    ALBUMIN 3.3 (L) 06/26/2020    ALT 22 06/26/2020    AST 18 06/26/2020       Imaging: reviewed in Epic    Total time spent for evaluation & management (excluding ACP documented separately): 45 Minutes  Start time - stop time: 11:00-11:45am    Greater than 50% of this time spent on counseling/coordination of care:  Yes.   See  ACP Note from today for additional billable service:  No.

## 2020-06-26 NOTE — Unmapped (Addendum)
Adam Keith)   Consultation - Liaison ([facility]) Psychiatry  [facility] Psychology Psychotherapy Note         Service Date:  06/26/20  Admit Date:  06/19/20  Clinician:  Lanae Boast, Psy.D.     Intervention:  16-37 min Individual Psychotherapy     BACKGROUND INFORMATION AND REASON FOR REFERRAL: Please see H&P for full details. Briefly, the patient is a 40yo male with pertinent past medical and mental health history of PH+ B cell ALL (diagnosed 01/20/20), adjustment disorder, anxiety, PTSD admitted 06/19/2020  1:23 PM for C1 Blinatumomab. He is being followed by Oncology/Hematology for management of the concerns above. [facility] Psychiatry services were requested for evaluation of anxiety and medication recommendations. Per their evaluation, the patient???s presentation is consistent with adjustment disorder with anxious mood. [facility] services were requested to provide psychotherapy to address mood symptoms while medically hospitalized. Of note, this patient is known to this clinician from previous hospitalization in January 2022. Chart reviewed.     ASSESSMENT  IMPRESSIONS/SUMMARY    At follow-up, the patient continued to endorse anxiety and agitation in the setting of hospitalization. He demonstrates a tendency to utilize ineffective communication style in the setting of agitation/anxiety, and has acknowledged a tendency to push everyone away.  He responded well to supportive listening and emotional validation. He is amenable to ongoing follow-up with [facility] for medication management. He has outpatient therapist and was provided with my contact information should he be interested in establishing therapeutic services through [facility] in the future.     DIAGNOSTIC IMPRESSIONS  Adjustment disorder, with anxiety   Depressive disorder, unspecified  Tobacco use disorder, severe (by history)  ALL    PLAN  RECOMMENDATIONS  1. [facility] will continue to follow the patient during medical hospitalization.  2. The patient is being followed by [facility]ry for medication management.   3. Safety: No acute safety concerns.  Please see safety assessment for further discussion.  4. Will refer the patient for psychiatric follow-up through [facility]. The patient was given my contact information and encouraged to reach out as needed.     SUBJECTIVE  SESSION CONTENT   At bedside, the patient was accompanied by his father, who he agreed to remaining present for most of session. The patient reported that he is anticipated to discharge tomorrow. Expressed frustration with aspects surrounding hospitalization, including not feeling that his needs are heard/adequately addressed.He stated that he has been trying to be respectful and feels disrespected by some staff. The patient was provided supportive listening and emotional validation. The patient explored longstanding tendency to avoid going to the doctor and indicated he feels very uncomfortable in medical settings.  Discussed the patient's interest in ongoing mental health treatment. He is amenable to ongoing medication management through [facility]. Regarding psychotherapy, the patient expressed preference to re-engage with outpatient psychotherapist that he and his wife see weekly. He was given this clinician's contact information and encouraged to reach out should he be interested in establishing indivudal psychotherapy through [facility] in the future.     OBJECTIVE   MENTAL STATUS     Orientation and consciousness: AOx4   Appearance and behavior: Stated age, calm, cooperative  Speech: Within normal limits  Language: Intact  Mood: Irritable  Affect: Constricted  Perceptual disturbance (hallucinations, illusions): None  Thought process and association: Linear and coherent  Thought content (delusions, obsessions etc.): None  Suicidal ideation/intent/plans: Denied  Homicidal ideation/intent/plans: Denied  Insight: Limited  Judgement: Good  Memory: WNL, although not formally  assessed    SAFETY:  Risk Assessment:  A suicide and violence risk assessment was previously performed on 06/24/20. Risk assessment remains essentially unchanged.  The patient remains not at acutely elevated risk.     EDUCATION/INTERVENTIONS:    -Completed mood check-in and reviewed the patient's current stressors and strategies to manage stress.  -Supportive therapy  -Discussed the patient???s treatment goals, revisited discussion of treatment options, and   explored goals of care.

## 2020-06-26 NOTE — Unmapped (Signed)
Assessed port needle at bedside (see [facility] liaison note). IV pump had been beeping downstream occlusion, RN very gently pressed on port needle, and beeping resolved.     On my assessment, pt has no c/o pain at port site, no erythema, no s/s extravasation.     Discussed with MDQ attending Dr. Aviva Kluver, and given the very low likelihood of needle dislodgement or extravasation, he is in agreement to continue blinatumomab infusion as scheduled.    Plan:  -continue to monitor port site and patient overnight  -continue blinatumomab infusion, given no s/s extravasation or needle dislodgement.    Jasmine Pang. Aurther Loft, MSN, APRN, AGNP-C  Nurse Practitioner  Hematology/Oncology  [facility]  Group pager: [phone]

## 2020-06-27 LAB — HEPATIC FUNCTION PANEL
ALBUMIN: 3.5 g/dL (ref 3.4–5.0)
ALKALINE PHOSPHATASE: 75 U/L (ref 46–116)
ALT (SGPT): 24 U/L (ref 10–49)
AST (SGOT): 20 U/L (ref <=34)
BILIRUBIN DIRECT: 0.1 mg/dL (ref 0.00–0.30)
BILIRUBIN TOTAL: 0.4 mg/dL (ref 0.3–1.2)
PROTEIN TOTAL: 6 g/dL (ref 5.7–8.2)

## 2020-06-27 LAB — CBC W/ AUTO DIFF
BASOPHILS ABSOLUTE COUNT: 0.1 10*9/L (ref 0.0–0.1)
BASOPHILS RELATIVE PERCENT: 0.3 %
EOSINOPHILS ABSOLUTE COUNT: 0 10*9/L (ref 0.0–0.5)
EOSINOPHILS RELATIVE PERCENT: 0 %
HEMATOCRIT: 28.7 % — ABNORMAL LOW (ref 39.0–48.0)
HEMOGLOBIN: 9.7 g/dL — ABNORMAL LOW (ref 12.9–16.5)
LYMPHOCYTES ABSOLUTE COUNT: 1.5 10*9/L (ref 1.1–3.6)
LYMPHOCYTES RELATIVE PERCENT: 4.9 %
MEAN CORPUSCULAR HEMOGLOBIN CONC: 33.7 g/dL (ref 32.0–36.0)
MEAN CORPUSCULAR HEMOGLOBIN: 31.6 pg (ref 25.9–32.4)
MEAN CORPUSCULAR VOLUME: 93.7 fL (ref 77.6–95.7)
MEAN PLATELET VOLUME: 7.7 fL (ref 6.8–10.7)
MONOCYTES ABSOLUTE COUNT: 1.6 10*9/L — ABNORMAL HIGH (ref 0.3–0.8)
MONOCYTES RELATIVE PERCENT: 5.2 %
NEUTROPHILS ABSOLUTE COUNT: 27.7 10*9/L — ABNORMAL HIGH (ref 1.8–7.8)
NEUTROPHILS RELATIVE PERCENT: 89.6 %
PLATELET COUNT: 176 10*9/L (ref 150–450)
RED BLOOD CELL COUNT: 3.06 10*12/L — ABNORMAL LOW (ref 4.26–5.60)
RED CELL DISTRIBUTION WIDTH: 19.8 % — ABNORMAL HIGH (ref 12.2–15.2)
WBC ADJUSTED: 30.9 10*9/L — ABNORMAL HIGH (ref 3.6–11.2)

## 2020-06-27 LAB — BASIC METABOLIC PANEL
ANION GAP: 9 mmol/L (ref 5–14)
BLOOD UREA NITROGEN: 30 mg/dL — ABNORMAL HIGH (ref 9–23)
BUN / CREAT RATIO: 16
CALCIUM: 9.1 mg/dL (ref 8.7–10.4)
CHLORIDE: 107 mmol/L (ref 98–107)
CO2: 24 mmol/L (ref 20.0–31.0)
CREATININE: 1.92 mg/dL — ABNORMAL HIGH
EGFR CKD-EPI AA MALE: 49 mL/min/{1.73_m2} — ABNORMAL LOW (ref >=60)
EGFR CKD-EPI NON-AA MALE: 43 mL/min/{1.73_m2} — ABNORMAL LOW (ref >=60)
GLUCOSE RANDOM: 99 mg/dL (ref 70–179)
POTASSIUM: 4.8 mmol/L (ref 3.4–4.8)
SODIUM: 140 mmol/L (ref 135–145)

## 2020-06-27 LAB — MAGNESIUM: MAGNESIUM: 1.9 mg/dL (ref 1.6–2.6)

## 2020-06-27 LAB — PHOSPHORUS: PHOSPHORUS: 5.5 mg/dL — ABNORMAL HIGH (ref 2.4–5.1)

## 2020-06-27 LAB — C-REACTIVE PROTEIN: C-REACTIVE PROTEIN: <4 mg/L (ref <=10.0)

## 2020-06-27 MED ORDER — HYDROMORPHONE 2 MG TABLET
ORAL_TABLET | Freq: Three times a day (TID) | ORAL | 0 refills | 2 days | Status: CP | PRN
Start: 2020-06-27 — End: 2020-07-02
  Filled 2020-06-27: qty 5, 2d supply, fill #0

## 2020-06-27 MED ORDER — ASPIRIN 81 MG CHEWABLE TABLET
ORAL_TABLET | Freq: Every day | ORAL | 0 refills | 36 days | Status: CP
Start: 2020-06-27 — End: 2020-08-01
  Filled 2020-06-27: qty 36, 36d supply, fill #0

## 2020-06-27 MED ORDER — CLONAZEPAM 0.5 MG TABLET
ORAL_TABLET | Freq: Two times a day (BID) | ORAL | 0 refills | 14 days | Status: CP
Start: 2020-06-27 — End: 2020-07-11
  Filled 2020-06-27: qty 28, 14d supply, fill #0

## 2020-06-27 MED ORDER — PREDNISONE 10 MG TABLET
ORAL_TABLET | ORAL | 0 refills | 26 days | Status: CP
Start: 2020-06-27 — End: 2020-07-23
  Filled 2020-06-27: qty 62, 26d supply, fill #0

## 2020-06-27 MED ADMIN — gabapentin (NEURONTIN) capsule 300 mg: 300 mg | ORAL | @ 13:00:00 | Stop: 2020-06-27

## 2020-06-27 MED ADMIN — carvediloL (COREG) tablet 12.5 mg: 12.5 mg | ORAL | @ 13:00:00 | Stop: 2020-06-27

## 2020-06-27 MED ADMIN — clonazePAM (KlonoPIN) tablet 1 mg: 1 mg | ORAL | @ 13:00:00 | Stop: 2020-06-27

## 2020-06-27 MED ADMIN — carvediloL (COREG) tablet 12.5 mg: 12.5 mg | ORAL

## 2020-06-27 MED ADMIN — oxyCODONE (OXYCONTIN) 12 hr crush resistant ER/CR tablet 30 mg: 30 mg | ORAL | Stop: 2020-07-08

## 2020-06-27 MED ADMIN — clonazePAM (KlonoPIN) tablet 1 mg: 1 mg | ORAL

## 2020-06-27 MED ADMIN — predniSONE (DELTASONE) tablet 40 mg: 40 mg | ORAL | @ 13:00:00 | Stop: 2020-06-27

## 2020-06-27 MED ADMIN — amLODIPine (NORVASC) tablet 10 mg: 10 mg | ORAL | @ 13:00:00 | Stop: 2020-06-27

## 2020-06-27 MED ADMIN — nortriptyline (PAMELOR) capsule 10 mg: 10 mg | ORAL | @ 01:00:00

## 2020-06-27 MED ADMIN — oxyCODONE (OXYCONTIN) 12 hr crush resistant ER/CR tablet 30 mg: 30 mg | ORAL | @ 13:00:00 | Stop: 2020-06-27

## 2020-06-27 MED ADMIN — gabapentin (NEURONTIN) capsule 300 mg: 300 mg | ORAL

## 2020-06-27 MED ADMIN — valACYclovir (VALTREX) tablet 500 mg: 500 mg | ORAL | @ 13:00:00 | Stop: 2020-06-27

## 2020-06-27 MED ADMIN — sulfamethoxazole-trimethoprim (BACTRIM DS) 800-160 mg tablet 160 mg of trimethoprim: 1 | ORAL | @ 13:00:00 | Stop: 2020-06-27

## 2020-06-27 MED ADMIN — hydrOXYzine (ATARAX) tablet 25 mg: 25 mg | ORAL

## 2020-06-27 MED ADMIN — HYDROmorphone (DILAUDID) tablet 2 mg: 2 mg | ORAL | @ 01:00:00 | Stop: 2020-07-10

## 2020-06-27 MED ADMIN — blinatumomab (BLINCYTO) 28 mcg/day in sodium chloride 0.9% NON-PVC 0.9 % IVPB (48-HR HOME INFUSION): 28 ug/d | INTRAVENOUS | @ 16:00:00 | Stop: 2020-06-29

## 2020-06-27 MED ADMIN — aspirin chewable tablet 81 mg: 81 mg | ORAL | @ 13:00:00 | Stop: 2020-06-27

## 2020-06-27 MED FILL — NORTRIPTYLINE 25 MG CAPSULE: ORAL | 30 days supply | Qty: 30 | Fill #0

## 2020-06-27 NOTE — Unmapped (Addendum)
Day 5 of blinatumomab  and day 7 of ponatinib.  Port needle changed; 10 ml of blood drawn back and wasted before flushing port for needle change.  Expected discharge 3/26, midday. Patient is to take the ponatinib home with him.  ONS ponatinib oral chemo education sheet was given to the patient CTM.

## 2020-06-27 NOTE — Unmapped (Signed)
Labs found to be within parameters for treatment today. Request for drug sent to pharmacy.

## 2020-06-27 NOTE — Unmapped (Signed)
1205: Patient present from 4-ONC for Blinatumomab bag change, no acute concerns reported. Infusion running through SL port lumen, blood return noted; NO forward flush performed. New bag connected, pump settings checked with 2nd RN. Patient discharged home alert, oriented and in stable condition with family present.

## 2020-06-28 NOTE — Unmapped (Signed)
Physician Discharge Summary [facility]  4 ONC [facility]  [address]  [place] [address]  Dept: [phone]  Loc: [phone]     Identifying Information:   Boston Cookson  1980-09-18  [id number]    Primary Care Physician: [name], MD     Referring Physician: Guerry Bruin     Code Status: Full Code    Admit Date: 06/19/2020    Discharge Date: 06/27/2020     Discharge To: Home    Discharge Service: [facility] - Hematology APP Floor Team (MEDQ)     Discharge Attending Physician: No att. 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 *      Outpatient Provider Follow Up Issues:   - Patient discharged with benzos and opioids for pain/itching and anxiety. F/u to [facility] and palliative care requested on 3/25. Please ensure these appointments have been made to help in assistance of pain and anxiety.       Supportive Care Recommendations:  We recommend based on the patient???s underlying diagnosis and treatment history the following supportive care:    1. Antimicrobial prophylaxis:  ALL (in remission, receiving post-remission therapy): Bacterial: Levofloxacin 500mg  PO daily (when absolute neutrophils </= 0.5);   Fungal: Fluconazole 400mg  PO daily (when absolute neutrophils </= 0.5);   Viral: Valacyclovir 500mg  PO daily (Continuous);   PJP:  SMX/TMP DS PO BID twice weekly (Sat/Sun)    2. Blood product support:  Leukoreduced blood products are required.  Irradiated blood products are preferred, but in case of urgent transfusion needs non-irradiated blood products may be used:     -  RBC transfusion threshold: transfuse 2 units for Hgb < 8 g/dL.  -  Platelet transfusion threshold: transfuse 1 unit of platelets for platelet count < 10, or for bleeding or need for invasive procedure.    Based on the patient's disease status and intensity of therapy, complete blood count with differential should be evaluated 1 times per week and used to guide transfusion support    3. Hematopoietic growth factor support: none    Hospital Course:   Bertrand Vowels is a 40 y.o. 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 Developed worsening skin pain, torso erythema and temporal edema for which drug was stopped. Restarted and then developed chest pain, again drug stopped. Patient was pre-medicated again and started on 9 mcg x 3 days an then escalated to 28 mcg x 2 days. He tolerated this well. Remained on Ponatinib and was started on ASA. He continued to complain of burning skin pain and itching and started on Oxycontin and up titrated Gabapentin. Dermatology consulted and do not feel symptoms are related to DRESS and will continue Prednisone taper as previously prescribed. Palliative care was consulted. Outpatient referral placed for palliative care follow up. Significant anxiety throughout admission. Increased Klonopin to 1 mg BID and given on going itching changed Remeron to Nortriptyline. On morning discharge day, patient more somnolent, therefore klonopin decreased to 0.5mg  BID with BID PRN available and dilaudid interval increased to q8h PRN. Patient's wife is to keep medications in locked cabinet and administer to patient appropriately.  In afternoon, patient more alert, awake with ICANS of 10. Patient appropriate for discharge. Referral placed for outpatient [facility]. Follow up scheduled as below. 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 The course was complicated by septic shock, candida krusei fungemia, MRSA bacteremia with septic emboli c/b acute renal failure and respiratory distress requiring dialysis and intubation.  Post-induction bmbx (day 29) demonstrated MRD-negative remission but low-level BCR-ABL persisted. Ultimately due to CKD, performance status, COVID infection and significant complications noted above cycle 2 of HyperCVAD was delayed and ultimately differed in favor of Blinatumomab, cycle 1D1=3/21. Will continue ppx Valtrex and Bactrim as well as Ponatinib and ASA ppx.     Primary Oncologist: [name]  Chemotherapy: Blinatumomab (for MRD)  Cycle: 1 D1 = 3/18@ 2100, reaction x 2, plan as below started on 3/21  - Blinatumomab 9 mcg/day x 3 days (as inpatient)   - Blinatumomab 28 mcg/day x 2 days (as inpatient), then remainder of 28 day cycle outpatient.     Disposition   - Infusion appt on 3/26 at 12 for 48 hour bag change   - Infusion appt on 3/28 at 12 for 7 day bag change then weekly   - Scheduled bag disconnection - scheduled for 4/18  - BMBx - scheduled on 4/21  - Follow up with [name] on 4/27  - Palliative care and [facility] to arrange for follow-up to assist in pain medication management.   - Follow-up for interim APP f/u clinic requested on day of discharge.     AKI to CKD: Initial insult secondary to sepsis. Now s/p dialysis with CKD and stable Cr. Dose medicines for creatinine clearance of approximately 30-35.      DRESS:  Followed by Dr. Caryn Section with [facility]. S/p punch biopsy on 3/11. He will continue prednisone taper (see below) and PRN hydroxyzine. Timing of onset occurred proximate to time when dose of dasatinib was being adjusted, posaconazole given and sotrovimab. Sutures removed by patient himself on 3/25.   - Prednisone taper                    40mg  daily x 7 (3/25-3/31)              30mg  dalily x 7 (4/1- 4/7)               20mg  daily x 7 (4/8-4/14)               10 mg daily x 7 (4/15-4/21)    Skin pain/itching: Present since COVID diagnosis and potentially worsened by Blinatumomab. Will discharge with OxyContin 30 mg BID and Dilaudid 2 mg PO PRN (interval increased from q4h to q8h PRN on day of discharge) as well as Gabapentin 300 mg TID and Atarax PRN. Palliative care consulted and will follow as an outpatient. CCSP referral placed as well..      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 Patient reports hypersensitivity of left hearing and difficulty with complex sounds and multiple conversations  - AVOID Lasix/loop diuretics, other ototoxic medications     HTN: Chronic condition since diagnosis. Continue home regimen includes Amlodipine and Coreg.       Anxiety/Depression: Chronic condition. Was previously on scheduled Ativan. No longer taking. Admitted on Remoron 15 mg QHS. Anxiety worsened significantly since Blinatumomab initiation. Psychiatry consulted. Discontinued Remeron and will discharge on Nortriptyline 10 mg x 7 day followed by 25 mg nightly. Will continue Klonopin 1 mg BID with PRN available, decreased to 0.5mg  BID and BID PRN on day  of discharge. Psychiatry to arrange follow up.     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 ???Close your eyes and stick out your tongue.??? 1/1 [employer] Ability to write a standard sentence (eg ???Our national bird is the Human resources officer??? 1/1 Point   Attention Ability to count backwards from 100 by 10 1/1 Point      Total 10/10 points       Procedures:  None  No admission procedures for hospital encounter.  ______________________________________________________________________  Discharge Medications:     Your Medication List      STOP taking these medications    LORazepam 0.5 MG tablet  Commonly known as: ATIVAN meclizine 25 mg tablet  Commonly known as: ANTIVERT     mirtazapine 15 MG tablet  Commonly known as: REMERON     traMADoL 50 mg tablet  Commonly known as: ULTRAM        START taking these medications    aspirin 81 MG chewable tablet  Chew 1 tablet (81 mg total) daily.     clonazePAM 0.5 MG tablet  Commonly known as: KlonoPIN  Take 1 tablet (0.5 mg total) by mouth two (2) times a day as needed for anxiety for up to 7 days.     clonazePAM 0.5 MG tablet  Commonly known as: KlonoPIN  Take 1 tablet (0.5 mg total) by mouth every twelve (12) hours for 7 days. May take an additional 1 tablet (0.5 mg total) by mouth 2 times a day as needed for anxiety.     gabapentin 300 MG capsule  Commonly known as: NEURONTIN  Take 1 capsule (300 mg total) by mouth Three (3) times a day.     nortriptyline 10 MG capsule  Commonly known as: PAMELOR  Take 1 capsule (10 mg total) by mouth nightly for 7 days.     nortriptyline 25 MG capsule  Commonly known as: PAMELOR  Take 1 capsule (25 mg total) by mouth nightly. Start after initial 7 days of nortriptyline 10mg .  Start taking on: July 04, 2020     OxyCONTIN 30 mg Tr12 12 hr crush resistant ER/CR tablet  Generic drug: oxyCODONE  Take 1 tablet (30 mg total) by mouth every twelve (12) hours.        CHANGE how you take these medications    HYDROmorphone 2 MG tablet  Commonly known as: DILAUDID  Take 1 tablet (2 mg total) by mouth every eight (8) hours as needed for pain,severe (7-10) for up to 5 days.  What changed:   ?? when to take this  ?? reasons to take this     ondansetron 8 MG tablet  Commonly known as: ZOFRAN  Take 1 tablet (8 mg total) by mouth every eight (8) hours as needed for nausea (or itching).  What changed: reasons to take this     predniSONE 10 MG tablet  Commonly known as: DELTASONE  Take 4 tablets (40 mg  total) by mouth daily for 5 days, THEN 3 tablets (30 mg total) daily for 7 days, THEN 2 tablets (20 mg total) daily for 7 days, THEN 1 tablet (10 mg total) daily for 7 days.  Start taking on: June 27, 2020  What changed: See the new instructions.        CONTINUE taking these medications    acetaminophen 325 MG tablet  Commonly known as: TYLENOL  Take 650 mg by mouth every six (6) hours as needed for pain.     amLODIPine 10 MG tablet  Commonly known as: NORVASC  Take 1 tablet (10 mg total) by mouth daily.     carvediloL 12.5 MG tablet  Commonly known as: COREG  Take 1 tablet (12.5 mg total) by mouth Two (2) times a day.     hydrOXYzine 25 MG tablet  Commonly known as: ATARAX  Take 1 tablet (25 mg total) by mouth every six (6) hours as needed.     PONATinib 30 mg tablet  Commonly known as: ICLUSIG  Take 1 tablet (30 mg total) by mouth daily. Swallow tablets whole. Do not crush, break, cut or chew tablets.     prochlorperazine 10 MG tablet  Commonly known as: COMPAZINE  Take 1 tablet (10 mg total) by mouth every six (6) hours as needed for nausea (if no relief from zofran (ondansetron)).     sulfamethoxazole-trimethoprim 800-160 mg per tablet  Commonly known as: BACTRIM DS  Take 1 tablet (160 mg of trimethoprim total) by mouth 2 times a day on Saturday, Sunday. For prophylaxis while on chemo.     valACYclovir 500 MG tablet  Commonly known as: VALTREX  Take 1 tablet (500 mg total) by mouth daily.            Allergies:  Bupropion hcl, Dapsone, Onion, Vancomycin analogues, Bismuth subsalicylate, Ceftaroline fosamil, and Furosemide  ______________________________________________________________________  Pending Test Results (if blank, then none):      Most Recent Labs:  All lab results last 24 hours -   Recent Results (from the past 24 hour(s))   Basic Metabolic Panel    Collection Time: 06/27/20  5:23 AM   Result Value Ref Range    Sodium 140 135 - 145 mmol/L    Potassium 4.8 3.4 - 4.8 mmol/L    Chloride 107 98 - 107 mmol/L    CO2 24.0 20.0 - 31.0 mmol/L    Anion Gap 9 5 - 14 mmol/L    BUN 30 (H) 9 - 23 mg/dL    Creatinine 1.61 (H) 0.60 - 1.10 mg/dL    BUN/Creatinine Ratio 16     EGFR CKD-EPI Non-African American, Male 43 (L) >=60 mL/min/1.61m2    EGFR CKD-EPI African American, Male 50 (L) >=60 mL/min/1.48m2    Glucose 99 70 - 179 mg/dL    Calcium 9.1 8.7 - 09.6 mg/dL   C-reactive protein    Collection Time: 06/27/20  5:23 AM   Result Value Ref Range    CRP <4.0 <=10.0 mg/L   Hepatic Function Panel    Collection Time: 06/27/20  5:23 AM   Result Value Ref Range    Albumin 3.5 3.4 - 5.0 g/dL    Total Protein 6.0 5.7 - 8.2 g/dL    Total Bilirubin 0.4 0.3 - 1.2 mg/dL    Bilirubin, Direct 0.45 0.00 - 0.30 mg/dL    AST 20 <=40 U/L    ALT 24 10 - 49 U/L    Alkaline Phosphatase 75 46 -  116 U/L   Magnesium Level    Collection Time: 06/27/20  5:23 AM   Result Value Ref Range    Magnesium 1.9 1.6 - 2.6 mg/dL   Phosphorus Level    Collection Time: 06/27/20  5:23 AM   Result Value Ref Range    Phosphorus 5.5 (H) 2.4 - 5.1 mg/dL   CBC w/ Differential    Collection Time: 06/27/20  5:23 AM   Result Value Ref Range    WBC 30.9 (H) 3.6 - 11.2 10*9/L    RBC 3.06 (L) 4.26 - 5.60 10*12/L    HGB 9.7 (L) 12.9 - 16.5 g/dL    HCT 29.5 (L) 62.1 - 48.0 %    MCV 93.7 77.6 - 95.7 fL    MCH 31.6 25.9 - 32.4 pg    MCHC 33.7 32.0 - 36.0 g/dL    RDW 30.8 (H) 65.7 - 15.2 %    MPV 7.7 6.8 - 10.7 fL    Platelet 176 150 - 450 10*9/L    Neutrophils % 89.6 %    Lymphocytes % 4.9 %    Monocytes % 5.2 %    Eosinophils % 0.0 %    Basophils % 0.3 %    Absolute Neutrophils 27.7 (H) 1.8 - 7.8 10*9/L    Absolute Lymphocytes 1.5 1.1 - 3.6 10*9/L    Absolute Monocytes 1.6 (H) 0.3 - 0.8 10*9/L    Absolute Eosinophils 0.0 0.0 - 0.5 10*9/L    Absolute Basophils 0.1 0.0 - 0.1 10*9/L    Anisocytosis Moderate (A) Not Present       Relevant Studies/Radiology (if blank, then none):  No results found.  ______________________________________________________________________    Activity Instructions     Activity as tolerated      It is important to remember that you blood counts will continue to drop while you are at home. This will cause you to feel more tired and place you at an increased risk for bleeding. Please avoid extraneous activities.          Diet Instructions     Discharge diet (specify)      Nutrition Therapy: Other Comment - Neutropenic     Discharge Nutrition Therapy: Regular    While your blood counts are low, it is important to follow these Neutropenic precautions:  Wash your hands frequently with soap and water  Take your temperature when you have chills or are not feeling well  Do not get cuts, punctures or scratches on your skin  Use a soft toothbrush  Prevent constipation  Drink lots of fluids (water, juice, etc.) to prevent dehydration  Avoid people who have colds or the flu  Do not visit crowded areas such as malls or movie theaters  Avoid anyone who has received a live vaccination (shot) within the last three weeks  Maintain a well-balanced diet and eat healthy foods, but avoid raw or uncooked foods, including raw vegetables and fruits  Speak with your doctor before having any dental work done   Limit exposure to pet feces (e.g., litter box)   Do only as much activity as you can tolerate               Other Instructions     Call MD for:  difficulty breathing, headache or visual disturbances      Call MD for:  extreme fatigue      Call MD for:  hives      Call MD for:  persistent dizziness or  light-headedness      Call MD for:  persistent nausea or vomiting      Call MD for:  redness, tenderness, or signs of infection (pain, swelling, redness, odor or green/yellow discharge around incision site)      Call MD for:  severe uncontrolled pain      Call MD for:  temperature >38.5 Celsius      Please call your provider and go to the closest ER if your temperature is > 100.93F.    Discharge instructions      New/Important Medications:  - Valtrex - helps to prevent shingles  - Bactrim - help to prevent bacterial infections   - OxyContin 30 mg twice daily   - Dilaudid 2 mg as needed for pain   - Gabapentin 300 mg three times a day   - Baby Aspirin - to prevent blood clots while on Ponatinib   - Nortriptyline 10 mg nightly x 7 days then 25 mg nightly thereafter - to help with itching and anxiety   - Zofran - as needed for nausea or itching   - Klonopin 1 mg twice daily for anxiety   - Prednisone taper as prescribed for DRESS syndrome     Please have someone help you give you your medications on a schedule so that you do not take too much of the anxiety and pain medications at once to cause you to be overly sleepy, drowsy.  This can be dangerous and cause your breathing rate to be too low or even stop breathing.    Home Health Needs:    - Blinatumomab bag changes in the [facility] infusion center     Follow up Appointments:   - Bag change on 3/26 at 12   - Bag change on 3/28 at 12   - Bag change on 4/4 at 12   - Bag change on 4/11 at 12   - Bag disconnect on 4/18 at 12   - BMBx on 4/21   - Follow up with [name] on 4/27   - follow up with Palliative Care, [facility] and Psychiatry to be scheduled     Diagnosis: MRD+ ALL     Course complications:  -  continued skin burning and itching       When to Call Your [facility] Cancer Care Team:   Monday- Friday from 8:00 am - 5:00 pm   On Nights, Weekends and Holidays   Call [phone]     Call [phone]  Or Toll free [phone]    Ask the operator to page the   Ask to speak to the Triage Nurse  Oncology Fellow on Call     RED ZONE:  Take action now!  You need to be seen right away. Call 911 or go to your nearest hospital for help.   - Symptoms are at a severe level of discomfort    - Bleeding that will not stop  - Chest Pain    - Hard to breathe    - Fall or passing out    - New Seizure    - Thoughts of hurting yourself or others     YELLOW ZONE: Take action today  This is NOT an all-inclusive list. Pleae call with any new or worsening symptoms.   Call your doctor, nurse or other healthcare provider at [phone]  You can be seen by a provider the same day through our Same Day Acute Care for Patients with Cancer program. - Symptoms are new  or worsening; You are not within your goal range for:    - Pain          - Swelling (leg, arm, abdomen, face, neck)    - Shortness of breath       - Skin rash or skin changes    - Bleeding (nose, urine, stool, wound)    - Wound issues (redness, drainage, re-opened)    - Feeling sick to your stomach and throwing up    - Confusion    - Mouth sores/pain in your mouth or throat    - Vision changes   - Hard stool or very loose stools (increase in ostomy   - Fever >100.4 F, chills   Output)        - Worsening cough with mucus that is green, yellow or bloody   - No urine for 12 hours      - Pain or burning when going to the bathroom    - Feeding tube or other catheter/tube issue    - Home infusion pump issue - call [phone]   - Redness or pain at previous IV or port/catheter site    - Depressed or anxiety     GREEN ZONE: You are in control   Your symptoms are under control. Continue to take your medicine as ordered. Keep all visits to the doctor.   - No increase or worsening symptoms   - Able to take your medicine   - Able to drink and eat     For your safety and best care, please DO NOT use MyChart messages to report red or yellow symptoms.   MyChart messages are only checked during weekday normal business hours and you should receive a   Response within 2 business days.   Please use MyChart only for the follows:   - Non-urgent medication refills, scheduling requests or general questions.           Patient Education:     Your blood counts may continue to drop, so it is good to adhere to the following precautions:    - Wash your hands routinely with soap and water  - Take your temperature when you have chills or are not feeling well  - Use a soft toothbrush  - Avoid constipation or straining with bowel movements. This may mean you occasionally need to take over-the-counter stool softeners or laxatives.   - Avoid people who have colds or the flu, or are not feeling well.  - Wear a mask when visiting crowded places.  - Avoid anyone who has received a live vaccination (shot) within the last three weeks  - Maintain a well-balanced diet and eat healthy foods  - Speak with your doctor before having any dental work done  - Limit exposure to pet feces (e.g., litter box)  - Do only as much activity as you can tolerate    Other instructions:  - Don't use dental floss if your platelet count is below 50,000. Your doctor or nurse should tell you if this is the case.  - Use any mouthwashes given to you as directed.  - If you can't tolerate regular brushing, use an oral swab (bristle-less) toothbrush, or use salt and baking soda to clean your mouth. Mix 1 teaspoon of salt and 1 teaspoon of baking soda into an 8-ounce glass of warm water. Swish and spit.  - Watch your mouth and tongue for white patches. This is a sign of fungal infection, a common side  effect of chemotherapy. Be sure to tell your doctor about these patches. Medication/mouthwashes can be prescribed to help you fight the fungal infection.      COVID-19 is a new challenge, but [facility] is dedicated to providing you and your loved ones with the best possible cancer care and support in the safest way possible during this time. We made two videos about the ways we are working to keep you safe, such as offering the option to visit your care team over the phone or through a video, as well as support services offered for our patients and their caregivers. If you have any questions about your cancer care, please call your care team.  ??  Video #1: Keeping [facility] Cancer Care patients safe during the COVID-19 crisis  http://go.eabjmlille.com  ??  Video #2: Support for cancer patients and their caregivers during the COVID-19 pandemic  http://go.SecureGap.uy    No dressing needed            Follow Up instructions and Outpatient Referrals     Amb Referral to Palliative Care      Ambulatory referral to [facility]      Services requested: Counseling    Call MD for:  difficulty breathing, headache or visual disturbances      Call MD for:  extreme fatigue      Call MD for:  hives      Call MD for:  persistent dizziness or light-headedness      Call MD for:  persistent nausea or vomiting      Call MD for:  redness, tenderness, or signs of infection (pain, swelling,   redness, odor or green/yellow discharge around incision site)      Call MD for:  severe uncontrolled pain      Call MD for:  temperature >38.5 Celsius      Discharge instructions          Appointments which have been scheduled for you    Jun 29, 2020 12:00 PM  (Arrive by 11:30 AM)  NURSE LAB DRAW with ADULT ONC LAB  [facility] ADULT ONCOLOGY LAB DRAW STATION [facility] [place] [facility] REGION) [address]  [place] [address]  [phone]      Jun 29, 2020  1:00 PM  (Arrive by 12:30 PM)  LEVEL 150 with [employer] CHAIR 20  [place] ONCOLOGY INFUSION [place] [facility] REGION) [address]  [place] [address]  [phone]      Jul 06, 2020 12:00 PM  (Arrive by 11:30 AM)  NURSE LAB DRAW with ADULT ONC LAB  [facility] ADULT ONCOLOGY LAB DRAW STATION [place] [facility] REGION) [address]  [place] [address]  [phone]      Jul 06, 2020  1:00 PM  (Arrive by 12:30 PM)  LEVEL 150 with [employer] CHAIR 21  [place] ONCOLOGY INFUSION [place] [facility] REGION) [address]  [place] [address]  [phone]      Jul 08, 2020  8:30 AM  (Arrive by 8:15 AM)  ADD ON AUDIO with SPCAUD AUDIOLOGIST  [facility] AUDIOLOGY [address] [place] [facility] REGION) [address]  [address]  [place] [address]  [phone]      Jul 08, 2020  9:30 AM  (Arrive by 9:15 AM)  NEW ENT with Despina Hick, MD  [place] [facility] [address] [place] ([place]  [facility] REGION) [address]  [place] [address]  [phone]      Jul 13, 2020 12:00 PM  (Arrive by 11:30 AM)  NURSE LAB DRAW with ADULT ONC LAB  [facility] ADULT ONCOLOGY LAB DRAW STATION [place] [facility] REGION) [address]  [place] [id number]  [phone]      Jul 13, 2020  1:00 PM  (Arrive by 12:30 PM)  LEVEL 150 with [employer] CHAIR 18  [place] [facility] REGION) [address]  [place] [address]  [phone]      Jul 20, 2020  1:00 PM  (Arrive by 12:30 PM)  LEVEL 060 with [employer] CHAIR 16  [place] [facility] REGION) [address]  [place] [address]  [phone]      Jul 23, 2020 12:00 PM  (Arrive by 11:30 AM)  NURSE LAB DRAW with ADULT ONC LAB  [facility] ADULT ONCOLOGY LAB DRAW STATION [facility] REGION) [address]  [place] [address]  [phone]      Jul 23, 2020  1:00 PM  (Arrive by 12:30 PM)  BONE MARROW BIOPSY with Lenon Ahmadi, AGNP  [place] [facility] REGION) [address]  [place] [address]  [phone]      Jul 29, 2020 12:00 PM  (Arrive by 11:30 AM)  NURSE LAB DRAW with ADULT ONC LAB  [facility] ADULT ONCOLOGY LAB DRAW STATION  [facility] REGION) [address]  [place] [address]  [phone]      Jul 29, 2020  1:00 PM  (Arrive by 12:30 PM)  RETURN ACTIVE [place] with Lenon Ahmadi, [place]  [place] [facility] 2ND [facility] REGION) [address]  [place] [address]  [phone]           ______________________________________________________________________  Discharge Day Services:  BP 155/69  - Pulse 83  - Temp 37.1 ??C (98.8 ??F) (Axillary)  - Resp 20  - Ht 194 cm (6' 4.38)  - Wt (!) 107.2 kg (236 lb 5.3 oz)  - SpO2 97%  - BMI 28.48 kg/m??   Pt seen on the day of discharge and determined appropriate for discharge.    Condition at Discharge: good    Length of Discharge: I spent greater than 30 mins in the discharge of this patient.

## 2020-06-29 ENCOUNTER — Ambulatory Visit: Admit: 2020-06-29 | Discharge: 2020-06-30 | Payer: MEDICAID

## 2020-06-29 ENCOUNTER — Other Ambulatory Visit: Admit: 2020-06-29 | Discharge: 2020-06-30 | Payer: MEDICAID

## 2020-06-29 DIAGNOSIS — C9100 Acute lymphoblastic leukemia not having achieved remission: Principal | ICD-10-CM

## 2020-06-29 LAB — SLIDE REVIEW

## 2020-06-29 LAB — COMPREHENSIVE METABOLIC PANEL
ALBUMIN: 3.6 g/dL (ref 3.4–5.0)
ALKALINE PHOSPHATASE: 80 U/L (ref 46–116)
ALT (SGPT): 26 U/L (ref 10–49)
ANION GAP: 13 mmol/L (ref 5–14)
AST (SGOT): 22 U/L (ref <=34)
BILIRUBIN TOTAL: 0.4 mg/dL (ref 0.3–1.2)
BLOOD UREA NITROGEN: 31 mg/dL — ABNORMAL HIGH (ref 9–23)
BUN / CREAT RATIO: 16
CALCIUM: 9 mg/dL (ref 8.7–10.4)
CHLORIDE: 106 mmol/L (ref 98–107)
CO2: 22 mmol/L (ref 20.0–31.0)
CREATININE: 1.99 mg/dL — ABNORMAL HIGH
EGFR CKD-EPI AA MALE: 47 mL/min/{1.73_m2} — ABNORMAL LOW (ref >=60)
EGFR CKD-EPI NON-AA MALE: 41 mL/min/{1.73_m2} — ABNORMAL LOW (ref >=60)
GLUCOSE RANDOM: 130 mg/dL (ref 70–179)
POTASSIUM: 4.2 mmol/L (ref 3.4–4.8)
PROTEIN TOTAL: 6.5 g/dL (ref 5.7–8.2)
SODIUM: 141 mmol/L (ref 135–145)

## 2020-06-29 LAB — CBC W/ AUTO DIFF
BASOPHILS ABSOLUTE COUNT: 0.1 10*9/L (ref 0.0–0.1)
BASOPHILS RELATIVE PERCENT: 0.4 %
EOSINOPHILS ABSOLUTE COUNT: 0.1 10*9/L (ref 0.0–0.5)
EOSINOPHILS RELATIVE PERCENT: 0.2 %
HEMATOCRIT: 33.5 % — ABNORMAL LOW (ref 39.0–48.0)
HEMOGLOBIN: 11.4 g/dL — ABNORMAL LOW (ref 12.9–16.5)
LYMPHOCYTES ABSOLUTE COUNT: 1.4 10*9/L (ref 1.1–3.6)
LYMPHOCYTES RELATIVE PERCENT: 5.4 %
MEAN CORPUSCULAR HEMOGLOBIN CONC: 34 g/dL (ref 32.0–36.0)
MEAN CORPUSCULAR HEMOGLOBIN: 31.9 pg (ref 25.9–32.4)
MEAN CORPUSCULAR VOLUME: 93.8 fL (ref 77.6–95.7)
MEAN PLATELET VOLUME: 7.8 fL (ref 6.8–10.7)
MONOCYTES ABSOLUTE COUNT: 0.9 10*9/L — ABNORMAL HIGH (ref 0.3–0.8)
MONOCYTES RELATIVE PERCENT: 3.3 %
NEUTROPHILS ABSOLUTE COUNT: 23.2 10*9/L — ABNORMAL HIGH (ref 1.8–7.8)
NEUTROPHILS RELATIVE PERCENT: 90.7 %
PLATELET COUNT: 179 10*9/L (ref 150–450)
RED BLOOD CELL COUNT: 3.57 10*12/L — ABNORMAL LOW (ref 4.26–5.60)
RED CELL DISTRIBUTION WIDTH: 20.1 % — ABNORMAL HIGH (ref 12.2–15.2)
WBC ADJUSTED: 25.6 10*9/L — ABNORMAL HIGH (ref 3.6–11.2)

## 2020-06-29 LAB — MAGNESIUM: MAGNESIUM: 1.7 mg/dL (ref 1.6–2.6)

## 2020-06-29 LAB — PHOSPHORUS: PHOSPHORUS: 4.3 mg/dL (ref 2.4–5.1)

## 2020-06-29 MED ADMIN — blinatumomab (BLINCYTO) 28 mcg/day in sodium chloride 0.9% NON-PVC 0.9 % IVPB (7-DAY HOME INFUSION): 28 ug/d | INTRAVENOUS | @ 19:00:00 | Stop: 2020-07-06

## 2020-06-29 NOTE — Unmapped (Signed)
Patient arrived to Bed 8 for Blinatumumab CADD pump dis/reconnect. Old CADD pump stopped and disconnected; new Blinatumumab CADD pump started, verified running & unclamped prior to departure.

## 2020-06-29 NOTE — Unmapped (Signed)
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  I am speaking to patient and wife Adam Keith.    Was a Nurse, learning disability used for this call? No    Verified patient's date of birth / HIPAA.    Specialty medication(s) to be sent: Hematology/Oncology: Iclusig    Non-specialty medications/supplies to be sent: none    Medications not needed at this time: none     Iclusig (ponatinib)    The patient declined counseling on missed dose instructions, goals of therapy, side effects and monitoring parameters, warnings and precautions, drug/food interactions and storage, handling precautions, and disposal because they have taken the medication previously. The information in the declined sections below are for informational purposes only and was not discussed with patient.     Medication & Administration     Dosage: Take 1 tablet (30 mg total) by mouth daily. Swallow tablets whole. Do not crush, break, cut or chew tablets.    Administration:   ??? May be administered with or without food.  ??? Swallow tablets whole; do not break, crush or chew or dissolve.      Adherence/Missed dose instructions: If you miss a dose, skip the dose and go back to your normal time.  Do not take 2 doses at the same time.  Do not take extra doses.    Goals of Therapy     ??? To prevent disease progression    Side Effects & Monitoring Parameters     Commonly reported side effects  ??? Fatigue, loss of strength and energy  ??? Nausea, vomiting  ??? Diarrhea, constipation, abdominal pain  ??? Common cold symptoms, nose and throat irritation  ??? Lack of appetite, weight loss  ??? Dry skin  ??? Joint pain, bone pain, muscle pain/spasm  ??? Restlessness, difficulty seeping  ??? Hair loss    The following side effects should be reported to the provider:  ??? Signs of infection (fever >100.4, chills, mouth sores/irritation, sputum production)  ??? Signs of hypertension (severe headache, severe dizziness, chest pain, confusion, difficulty breathing, visual changes)  ??? Signs of pancreatitis (severe abdominal pain, severe back pain, severe nausea or vomiting)  ??? Signs of bleeding (vomiting or coughing up blood, blood that looks like coffee grounds, blood in the urine or black, red tarry stools, bruising that gets bigger without reason, any persistent or severe bleeding, impaired wound healing)  ??? Signs of blood clots (numbness or weakness on one side of the body, pain, redness, tenderness, warmth, or swelling in the arms or legs, change in color of an arm of leg, chest pain, shortness of breath, fast heartbeat)  ??? Signs of heart problems (cough, shortness of breath that is new or worse, swelling in ankles or legs, weight gain >5lbs in 24 hours, dizziness or passing out, abnormal heart beat, fast heartbeat, slow heartbeat)  ??? Signs of liver problems (dark urine, abdominal pain, light-colored stools, vomiting, yellow skin or eyes)  ??? Signs of cerebrovascular disease (change in strength on one side is greater than the other, trouble speaking or thinking, change in balance or vision changes)  ??? Signs of fluid and electrolyte problems (mood changes, confusion, muscle pain or weakness, abnormal/fast heartbeat, severe dizziness, passing out)  ??? Signs of high blood sugar (confusion, fatigue, increased thirst, passing a lot of urine, flushing, fast breathing or breath that smells like fruit.  ???  Pale extremities, blue/gray skin discoloration of extremities  ??? Signs of vision changes (eye pain, severe eye irritation, sensitivity to light, dry eye, floater in the eye)  ??? Abdominal swelling  ??? Signs of nerve problems, like sensitivity to heat or cold; decreased sense of touch; burning, numbness, or tingling; pain, or weakness in the arms, hands, legs, or feet)  ??? Signs of posterior reversible encephalopathy syndrome like confusion, not alert, vision changes, seizures, or severe headache  ??? Signs of tumor lysis syndrome like fast heartbeat or abnormal heartbeat; any passing out; unable to pass urine; muscle weakness or cramps; nausea, vomiting, diarrhea or lack of appetite; or feeling sluggish  ??? Signs of anaphylaxis (wheezing, chest tightness, swelling of face, lips, tongue or throat)    Monitoring Parameters:   ??? CBC with differential and platelets every 2 weeks for the first 3 months, then monthly or as clinically needed;   ??? Liver function tests at baseline and at least monthly thereafter or more frequently if clinically warranted  ??? Serum lipase every 2 weeks for the first 2 months and monthly thereafter (more frequently in patients with a history of pancreatitis or alcohol abuse)  ??? Serum electrolytes and uric acid;   ??? Cardiac function   ??? Blood pressure  ??? Signs/symptoms of arterial/venous occlusion or thromboembolism, hemorrhage, fluid retention, pancreatitis (clinical signs), gastrointestinal perforation/fistula, hepatotoxicity (jaundice, anorexia, bleeding, bruising), reversible posterior leukoencephalopathy syndrome, neuropathy  ??? Comprehensive ocular exam at baseline and periodically  ??? Adherence    Contraindications, Warnings, & Precautions     ; [US Boxed Warning]: Arterial occlusions have occurred in at least 35% of ponatinib-treated patients. Some patients experienced more than 1 type of event. Events included fatal myocardial infarction (MI), stroke, stenosis of large arterial vessels of the brain, severe peripheral vascular disease, and the need for urgent revascularization procedures; incidents were observed in patients with and without cardiovascular risk factors (including patients ?41 years of age). Monitor closely for arterial occlusion; interrupt or discontinue therapy immediately for arterial occlusion. Consider risk: benefit ratio when deciding to restart therapy     ; [US Boxed Warning]: Liver failure and death resulting from ponatinib-induced hepatotoxicity were observed; monitor liver function prior to and at least monthly (or as clinically indicated) during treatment. The median time to onset was 3 months (range: less than 1 month to 47 months). Hepatotoxicity may require treatment interruption (followed by dose reduction) or discontinuation.     ; [US Boxed Warning]: Serious heart failure (HF) or left ventricular dysfunction, including fatalities, were reported in clinical trials. Monitor for signs/symptoms of HF; interrupt or discontinue ponatinib therapy for new or worsening HF. The most commonly reported heart failure events were congestive heart failure and decreased ejection fraction. Treat as clinically warranted if HF develops. Consider ponatinib discontinuation in the event of serious HF.    ; [US Boxed Warning]: Venous occlusive events have occurred in 6% of ponatinib-treated patients. Monitor for evidence of venous thromboembolism. Consider dose modification or discontinuation of ponatinib in patients who develop serious venous thromboembolism. Venous thromboembolism, including deep vein thrombosis, pulmonary embolism, superficial thrombophlebitis, and retinal vein thrombosis have been reported.    ; Bone marrow suppression: Severe myelosuppression (grade 3 or 4) is commonly   observed with ponatinib, and the incidence was greater in patients with accelerated or blast phase CML and Ph+ ALL. The median onset to severe myelosuppression was 1 month (range: up to 40 months).     ; Fluid retention/edema: Serious fluid retention events,  including fatality due to brain edema (case report), were observed in ponatinib-treated patients. Peripheral edema, pleural effusions, pericardial effusions, and peripheral swelling were commonly seen.    ; GI perforation: Serious GI perforation (fistula) occurred very rarely; monitor for signs/symptoms of perforation and/or fistula. Permanently discontinue if GI perforation occurs.    ; Hemorrhage: Hemorrhagic events occurred in ponatinib-treated patients, including serious events such as cerebral (subdural hematoma) and GI hemorrhages; fatalities were reported. Serious bleeding episodes occurred more frequently in patients with accelerated or blast phase CML, and Ph+ ALL; most patients had grade 4 thrombocytopenia.    ; Hypertension: Treatment-emergent blood pressure elevations (systolic or diastolic) developed in over two-thirds of ponatinib-treated patients; symptomatic hypertension or hypertensive crisis were reported in several patients, requiring urgent intervention. Blood pressure may worsen in patients with preexisting hypertension    ; Arrhythmias: Cardiac arrhythmias (bradyarrhythmias and tachyarrhythmias) have been reported. The most commonly reported arrhythmia was atrial fibrillation; ~50% of events were grade 3 or 4. Other grade 3 or 4 rhythm disorders have occurred (case reports).     ; Neuropathy: Peripheral and cranial neuropathy have been reported. Peripheral neuropathy, paresthesia, hypoesthesia, hyperesthesia, dysgeusia, and muscular weakness occurred most frequently; cranial neuropathy occurred rarely. In one-quarter of patients who experienced symptoms, neuropathy developed during the first month of therapy.     ; Ocular toxicity: Serious ocular events such as blindness and blurred vision have occurred with ponatinib use. Macular edema, retinal vein occlusion, and retinal hemorrhage have been reported in a small percentage of patients; conjunctival irritation, corneal erosion or abrasion, dry eye, conjunctivitis, conjunctival hemorrhage, hyperaemia and edema, or eye pain occurred more frequently. Other toxicities include cataracts, periorbital edema, blepharitis, glaucoma, eyelid edema, ocular hyperaemia, iritis, iridocyclitis, and ulcerative keratitis.     ; Pancreatitis: Treatment-related lipase elevations and clinical pancreatitis occurred in clinical studies, including grade 3 and 4 events. The median time to onset was 14 days (range: 3 days to ~48 months); the majority of cases resolved within 2 weeks of therapy interruption or dose reduction.     ; Reversible posterior leukoencephalopathy syndrome: RPLS has been reported in post-marketing surveillance.     ; Tumor lysis syndrome: Hyperuricemia and serious tumor lysis syndrome (rare) were reported. Patients should receive adequate hydration and be monitored for elevated uric acid levels and/or the development of tumor lysis syndrome. Manage elevated uric acid levels prior to initiating therapy.    ; Wound healing impairment: As ponatinib inhibits vascular endothelial growth factor activity, therapy may impair wound healing. Hold therapy for at least 1 week prior to elective surgery; resume therapy ?2 weeks following major surgery and until adequate wound healing.    ; Cardiovascular disease: Patients with or without cardiovascular risk factors, and those with a prior history of ischemia, hypertension, diabetes, or hyperlipidemia may be at increased risk for vascular occlusion when treated with ponatinib. \    ; Ponatinib is not indicated and not recommended for treatment of newly diagnosed chronic phase CML.    ; Hepatic impairment: A single-dose (30 mg) pharmacokinetic study found that ponatinib exposure was not increased in patients with hepatic impairment (Child-Pugh class A, B, or C) as compared to patients with normal hepatic function. Monitor closely when administering to patients with impaired hepatic function. The starting dose should be reduced in patients with hepatic impairment.    ; Patients' ?41 years of age may be more likely to experience vascular occlusion, weakness, decreased appetite, dyspnea, increased lipase, muscle spasms, peripheral edema, and thrombocytopenia; monitor  closely. Cautious dose selection is recommended based on greater frequency of decreased hepatic, renal, or cardiac function, and of concomitant disease or other drug therapy.    ; Reproductive concerns Verify pregnancy status prior to initiating ponatinib treatment. Women of childbearing potential should use effective contraception during treatment and for 3 weeks after the last dose.  Based on animal data and its mechanism of action, ponatinib is expected to cause fetal harm if used during pregnancy.    ; Breastfeeding concerns: It is not known if ponatinib is excreted in breast milk. Due to the potential for serious adverse reactions in the nursing infant, the manufacturer recommends that breast-feeding be discontinued during therapy and for 6 days after the last dose.  Drug/Food Interactions     ??? Medication list reviewed in Epic. The patient was instructed to inform the care team before taking any new medications or supplements. No drug interactions identified  ??? Reduce ponatinib dose to 30 mg once daily when administered with concomitant strong CYP3A inhibitors  ??? Avoid grapefruit and grapefruit juice (may increase serum concentrations of ponatinib)    Storage, Handling Precautions, & Disposal   ??? Store at room temperature in the original container (do not use a pillbox or store with other medications).   ??? Caregivers helping administer medication should wear gloves and wash hands immediately after.    ??? Keep the lid tightly closed. Keep out of the reach of children and pets.  ??? Do not flush down a toilet or pour down a drain unless instructed to do so.  Check with your local police department or fire station about drug take-back programs in your area.      Current Medications (including OTC/herbals), Comorbidities and Allergies     Current Outpatient Medications   Medication Sig Dispense Refill   ??? acetaminophen (TYLENOL) 325 MG tablet Take 650 mg by mouth every six (6) hours as needed for pain.      ??? amLODIPine (NORVASC) 10 MG tablet Take 1 tablet (10 mg total) by mouth daily. 30 tablet 0   ??? aspirin 81 MG chewable tablet Chew 1 tablet (81 mg total) daily. 36 tablet 0 ??? carvediloL (COREG) 12.5 MG tablet Take 1 tablet (12.5 mg total) by mouth Two (2) times a day. 60 tablet 3   ??? clonazePAM (KLONOPIN) 0.5 MG tablet Take 1 tablet (0.5 mg total) by mouth two (2) times a day as needed for anxiety for up to 7 days. 14 tablet 0   ??? clonazePAM (KLONOPIN) 0.5 MG tablet Take 1 tablet (0.5 mg total) by mouth every twelve (12) hours for 7 days. May take an additional 1 tablet (0.5 mg total) by mouth 2 times a day as needed for anxiety. 28 tablet 0   ??? gabapentin (NEURONTIN) 300 MG capsule Take 1 capsule (300 mg total) by mouth Three (3) times a day. 90 capsule 0   ??? HYDROmorphone (DILAUDID) 2 MG tablet Take 1 tablet (2 mg total) by mouth every eight (8) hours as needed for pain,severe (7-10) for up to 5 days. 5 tablet 0   ??? hydrOXYzine (ATARAX) 25 MG tablet Take 1 tablet (25 mg total) by mouth every six (6) hours as needed. 60 tablet 1   ??? nortriptyline (PAMELOR) 10 MG capsule Take 1 capsule (10 mg total) by mouth nightly for 7 days. 7 capsule 0   ??? [START ON 07/04/2020] nortriptyline (PAMELOR) 25 MG capsule Take 1 capsule (25 mg total) by mouth nightly. Start after initial 7  days of nortriptyline 10mg . 30 capsule 0   ??? ondansetron (ZOFRAN) 8 MG tablet Take 1 tablet (8 mg total) by mouth every eight (8) hours as needed for nausea (or itching). 60 tablet 2   ??? oxyCODONE (OXYCONTIN) 30 mg TR12 12 hr crush resistant ER/CR tablet Take 1 tablet (30 mg total) by mouth every twelve (12) hours. 14 tablet 0   ??? PONATinib (ICLUSIG) 30 mg tablet Take 1 tablet (30 mg total) by mouth daily. Swallow tablets whole. Do not crush, break, cut or chew tablets. 30 tablet 5   ??? predniSONE (DELTASONE) 10 MG tablet Take 4 tablets (40 mg total) by mouth daily for 5 days, THEN 3 tablets (30 mg total) daily for 7 days, THEN 2 tablets (20 mg total) daily for 7 days, THEN 1 tablet (10 mg total) daily for 7 days. 62 tablet 0   ??? prochlorperazine (COMPAZINE) 10 MG tablet Take 1 tablet (10 mg total) by mouth every six (6) hours as needed for nausea (if no relief from zofran (ondansetron)). 60 tablet 1   ??? sulfamethoxazole-trimethoprim (BACTRIM DS) 800-160 mg per tablet Take 1 tablet (160 mg of trimethoprim total) by mouth 2 times a day on Saturday, Sunday. For prophylaxis while on chemo. 48 tablet 3   ??? valACYclovir (VALTREX) 500 MG tablet Take 1 tablet (500 mg total) by mouth daily. 90 tablet 3     No current facility-administered medications for this visit.       Allergies   Allergen Reactions   ??? Bupropion Hcl Other (See Comments)     Per patient out of touch with reality, suicidal, homicidal   ??? 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 Onion Anaphylaxis   ??? Vancomycin Analogues      Hearing loss with Lasix  Other reaction(s): Other (See Comments)  Hearing loss  lasix   ??? Bismuth Subsalicylate Nausea And Vomiting   ??? Ceftaroline Fosamil Rash and Other (See Comments)     Rash X 2 and pancytopenia 02/2020   ??? Furosemide      With Vancomycin caused hearing loss  Other reaction(s): Other (See Comments)  With Vancomycin caused hearing loss       Patient Active Problem List   Diagnosis   ??? Anxiety   ??? Tobacco use disorder   ??? AKI (acute kidney injury) (CMS-HCC)   ??? Transaminitis   ??? 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 Hyperphosphatemia   ??? MRSA bacteremia   ??? Septic shock due to Staphylococcus aureus (CMS-HCC)   ??? Hyperkalemia   ??? Hiccups   ??? Hypernatremia   ??? Red blood cell antibody positive   ??? Hypokalemia   ??? Hypomagnesemia   ??? COVID-19   ??? Dyspnea   ??? Depression with anxiety       Reviewed and up to date in Epic.    Appropriateness of Therapy     Acute infections noted within Epic:  No active infections  Patient reported infection: None    Is medication and dose appropriate based on diagnosis and infection status? Yes    Prescription has been clinically reviewed: Yes      Baseline Quality of Life Assessment      How many days over the past month did your ALL in remission  keep you from your normal activities? For example, brushing your teeth or getting up in the morning. 0    Financial Information  Medication Assistance provided: None Required    Anticipated copay of $3 / 30 days reviewed with patient. Verified delivery address.    Delivery Information     Scheduled delivery date: 07/03/20    Expected start date: continuing. Currently has 14 days on hand    Medication will be delivered via UPS to the prescription address in [facility].  This shipment will not require a signature.      Explained the services we provide at [facility] and that each month we would call to set up refills.  Stressed importance of returning phone calls so that we could ensure they receive their medications in time each month.  Informed patient that we should be setting up refills 7-10 days prior to when they will run out of medication.  A pharmacist will reach out to perform a clinical assessment periodically.  Informed patient that a welcome packet, containing information about our pharmacy and other support services, a Notice of Privacy Practices, and a drug information handout will be sent.      Patient verbalized understanding of the above information as well as how to contact the pharmacy at [phone] option 4 with any questions/concerns.  The pharmacy is open Monday through Friday 8:30am-4:30pm.  A pharmacist is available 24/7 via pager to answer any clinical questions they may have.    Patient Specific Needs     - Does the patient have any physical, cognitive, or cultural barriers? No    - Patient prefers to have medications discussed with  Patient and wife Adam Keith     - Is the patient or caregiver able to read and understand education materials at a high school level or above? Yes    - Patient's primary language is  English     - Is the patient high risk? Yes, patient is taking oral chemotherapy. Appropriateness of therapy as been assessed    - Does the patient require a Care Management Plan? No     - Does the patient require physician intervention or other additional services (i.e. nutrition, smoking cessation, social work)? No      Adam Keith A [facility] Specialty Pharmacist

## 2020-06-29 NOTE — Unmapped (Signed)
Referral to Outpatient Oncology Palliative Care Clinic (OOPC)     06/26/20: NEW pt referral to Outpatient Palliative Care Clinic [facility]) from IP DC team     Pt was followed by IP palliative care team during hospitalization.      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 The course was complicated by septic shock, candida krusei fungemia, MRSA bacteremia with septic emboli c/b acute renal failure and respiratory distress requiring dialysis and intubation. ??Post-induction bmbx (day 29) demonstrated MRD-negative remission but low-level BCR-ABL persisted.??Ultimately due to CKD, performance status, COVID infection and significant complications noted above cycle 2 of HyperCVAD was delayed and ultimately differed in favor of Blinatumomab, cycle 1D1=3/21. Will continue ppx??Valtrex and Bactrim.    Anxiety/Depression: Chronic condition. Was previously on scheduled Ativan. No longer taking. Admitted on Remoron 15 mg QHS. Anxiety worsened significantly since Blinatumomab initiation. Psychiatry consulted. Discontinued Remeron and will discharge on Nortriptyline 10 mg x 7 day followed by 25 mg nightly. Will continue Klonopin 1 mg BID with PRN available, decreased to 0.5mg  BID and BID PRN on day of discharge. Psychiatry to arrange follow up.      Plan:  Appointment request sent to [facility] scheduler for next available, new patient appointment.        Allegra Lai RN BSN OCN MS  RN Clinical Coordinator  [facility] Outpatient Oncology Palliative Care [facility])  [facility]  Providers: Dr. Kathlynn Grate, Burtis Junes ANP, Dr. Ulanda Edison, Dr. Mickie Hillier, Dr Darrin Nipper, [name] CPP  Phone: [phone]  Office: [phone]

## 2020-06-30 NOTE — Unmapped (Signed)
Hi,     [name] contacted the Communication Center requesting to speak with the care team of Adam Keith to discuss:    Patient wants to convert [name] appointment to a virtual appointment.    Please contact Adam Keith at [phone].    Thank you,   Kelli Hope  [facility]   [phone]

## 2020-06-30 NOTE — Unmapped (Signed)
Placed call to Adam Keith to discuss his upcoming transition-of-care appointment scheduled for tomorrow (Wednesday 3/30).  He discharged over the weekend so this appointment was made for him yesterday (Monday 3/28) which is why it wasn't on his discharge summary.  He lives about an hour away and voiced his frustration about coming in for an additional appointment when he was just here on Monday for his blinatumumab bag exchange.  We discussed that this appointment was requested by his inpatient team because several new pain and anxiety medications had been prescribed to him on his discharge.  There was concern that he would require another refill of these medications before he could be established with the outpatient palliative care team (per palliative team, will be scheduled with Dr. Genice Rouge on Monday 4/4).  The patient states he is feeling really well at home and has not had to take any of his prescribed pain or anxiety medications, he would like to cancel the visit for tomorrow.  He is scheduled appropriately for the rest of his blinatumumab bag changes, has follow-up with [facility] on 4/11, repeat BMBx on 4/21 and clinic follow-up on 4/27.

## 2020-07-01 NOTE — Unmapped (Signed)
Encounter addended by: Clover Mealy, RN on: 07/01/2020 7:29 AM   Actions taken: Charge Capture section accepted

## 2020-07-03 MED ORDER — PREDNISONE 10 MG TABLET
ORAL_TABLET | Freq: Every day | ORAL | 0 refills | 7 days | Status: CN
Start: 2020-07-03 — End: 2020-07-10

## 2020-07-04 MED ORDER — NORTRIPTYLINE 25 MG CAPSULE
ORAL_CAPSULE | Freq: Every evening | ORAL | 0 refills | 30 days | Status: CP
Start: 2020-07-04 — End: 2020-08-03

## 2020-07-06 ENCOUNTER — Ambulatory Visit
Admit: 2020-07-06 | Discharge: 2020-07-07 | Payer: MEDICAID | Attending: Student in an Organized Health Care Education/Training Program | Primary: Student in an Organized Health Care Education/Training Program

## 2020-07-06 ENCOUNTER — Ambulatory Visit: Admit: 2020-07-06 | Discharge: 2020-07-07 | Payer: MEDICAID

## 2020-07-06 ENCOUNTER — Other Ambulatory Visit: Admit: 2020-07-06 | Discharge: 2020-07-07 | Payer: MEDICAID

## 2020-07-06 DIAGNOSIS — K5900 Constipation, unspecified: Principal | ICD-10-CM

## 2020-07-06 DIAGNOSIS — F419 Anxiety disorder, unspecified: Principal | ICD-10-CM

## 2020-07-06 DIAGNOSIS — C9100 Acute lymphoblastic leukemia not having achieved remission: Principal | ICD-10-CM

## 2020-07-06 DIAGNOSIS — Z515 Encounter for palliative care: Principal | ICD-10-CM

## 2020-07-06 DIAGNOSIS — G893 Neoplasm related pain (acute) (chronic): Principal | ICD-10-CM

## 2020-07-06 DIAGNOSIS — R5383 Other fatigue: Principal | ICD-10-CM

## 2020-07-06 LAB — COMPREHENSIVE METABOLIC PANEL
ALBUMIN: 3.8 g/dL (ref 3.4–5.0)
ALKALINE PHOSPHATASE: 69 U/L (ref 46–116)
ALT (SGPT): 20 U/L (ref 10–49)
ANION GAP: 7 mmol/L (ref 5–14)
AST (SGOT): 13 U/L (ref <=34)
BILIRUBIN TOTAL: 0.3 mg/dL (ref 0.3–1.2)
BLOOD UREA NITROGEN: 27 mg/dL — ABNORMAL HIGH (ref 9–23)
BUN / CREAT RATIO: 14
CALCIUM: 9.1 mg/dL (ref 8.7–10.4)
CHLORIDE: 107 mmol/L (ref 98–107)
CO2: 23 mmol/L (ref 20.0–31.0)
CREATININE: 1.92 mg/dL — ABNORMAL HIGH
EGFR CKD-EPI AA MALE: 49 mL/min/{1.73_m2} — ABNORMAL LOW (ref >=60)
EGFR CKD-EPI NON-AA MALE: 43 mL/min/{1.73_m2} — ABNORMAL LOW (ref >=60)
GLUCOSE RANDOM: 91 mg/dL (ref 70–179)
POTASSIUM: 3.9 mmol/L (ref 3.4–4.8)
PROTEIN TOTAL: 6.5 g/dL (ref 5.7–8.2)
SODIUM: 137 mmol/L (ref 135–145)

## 2020-07-06 LAB — CBC W/ AUTO DIFF
BASOPHILS ABSOLUTE COUNT: 0.1 10*9/L (ref 0.0–0.1)
BASOPHILS RELATIVE PERCENT: 0.8 %
EOSINOPHILS ABSOLUTE COUNT: 0.3 10*9/L (ref 0.0–0.5)
EOSINOPHILS RELATIVE PERCENT: 1.7 %
HEMATOCRIT: 31.1 % — ABNORMAL LOW (ref 39.0–48.0)
HEMOGLOBIN: 10.6 g/dL — ABNORMAL LOW (ref 12.9–16.5)
LYMPHOCYTES ABSOLUTE COUNT: 2.3 10*9/L (ref 1.1–3.6)
LYMPHOCYTES RELATIVE PERCENT: 13.8 %
MEAN CORPUSCULAR HEMOGLOBIN CONC: 33.9 g/dL (ref 32.0–36.0)
MEAN CORPUSCULAR HEMOGLOBIN: 31.9 pg (ref 25.9–32.4)
MEAN CORPUSCULAR VOLUME: 94.1 fL (ref 77.6–95.7)
MEAN PLATELET VOLUME: 7.5 fL (ref 6.8–10.7)
MONOCYTES ABSOLUTE COUNT: 1 10*9/L — ABNORMAL HIGH (ref 0.3–0.8)
MONOCYTES RELATIVE PERCENT: 5.9 %
NEUTROPHILS ABSOLUTE COUNT: 13 10*9/L — ABNORMAL HIGH (ref 1.8–7.8)
NEUTROPHILS RELATIVE PERCENT: 77.8 %
PLATELET COUNT: 186 10*9/L (ref 150–450)
RED BLOOD CELL COUNT: 3.31 10*12/L — ABNORMAL LOW (ref 4.26–5.60)
RED CELL DISTRIBUTION WIDTH: 20.4 % — ABNORMAL HIGH (ref 12.2–15.2)
WBC ADJUSTED: 16.7 10*9/L — ABNORMAL HIGH (ref 3.6–11.2)

## 2020-07-06 LAB — MAGNESIUM: MAGNESIUM: 1.7 mg/dL (ref 1.6–2.6)

## 2020-07-06 LAB — PHOSPHORUS: PHOSPHORUS: 4.4 mg/dL (ref 2.4–5.1)

## 2020-07-06 LAB — C-REACTIVE PROTEIN: C-REACTIVE PROTEIN: <4 mg/L (ref <=10.0)

## 2020-07-06 MED ORDER — HYDROMORPHONE 2 MG TABLET
ORAL_TABLET | ORAL | 0 refills | 10.00000 days | Status: CP | PRN
Start: 2020-07-06 — End: 2020-07-20
  Filled 2020-07-06: qty 56, 10d supply, fill #0

## 2020-07-06 MED ADMIN — blinatumomab (BLINCYTO) 28 mcg/day in sodium chloride 0.9% NON-PVC 0.9 % IVPB (7-DAY HOME INFUSION): 28 ug/d | INTRAVENOUS | @ 18:00:00 | Stop: 2020-07-13

## 2020-07-06 NOTE — Unmapped (Signed)
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     Assessment/Plan:   1.  Pain: Combination of treatment related pain plus underlying history of chronic pain.  There was miscommunication about how to take his opioids at his last discharge.  Had not been taking OxyContin scheduled.  Given this, we will currently hold OxyContin and use short acting Dilaudid 2 mg, with plan to assess overall needs over the next week.  He has expressed desire to not take a long-acting opioid if he does not have to.  Reviewed plan that if he is consistently taking more than 4 doses of Dilaudid a day or waking up overnight and pain that he would likely significantly benefit from a long-acting opioid.    -Restart Hydromorphone 2mg . Can take up to every 4 hours as needed.   -If taking more than 4 times a day or waking up overnight in pain we will restart Oxycontin      2.  Opioid-induced constipation: Has had worse constipation over the last several months.  Likely from opioids as well as possible side effects of cancer treatment.  Educated on goal of a bowel movement at least every 2 days.  Encouraged him to message [place] if not consistently having regular bowel movements.  ???Start senna 1 tablet every night    3. Anxiety: Seen by the psychiatry consult liaison service while in the hospital.  Has follow-up with Adam Keith on April 11.  Will defer management of psychotropics to her.  Again, patient misunderstood discharge instructions and has not been taking nortriptyline regularly.  Only has 25 mg capsules at home.  Reviewed importance of taking amitriptyline nightly and that it takes several weeks to get into his system.  ???Restart nortriptyline 25 mg nightly  ???Continue Klonopin as needed, not using daily  ???Given his description of intermittent feelings of physical restlessness associated with his anxiety, could consider propanolol 10 to 20 mg every 8 hours as needed    4.  Advance care planning: Did not address at this visit.      # Controlled substances risk management.   - Patient does not have a signed pain medication agreement with our team.   - [place] database was reviewed today and it was appropriate.   - Urine drug screen was not performed at this visit. Findings: not applicable.   - Patient has received information about safe storage and administration of medications.   - Patient has not received a prescription for narcan; is not applicable.       F/u: 3 to 5 weeks.  Clinical pharmacist to call next week to check in.    ----------------------------------------  Referring Provider: Dr. Berline Keith  Oncology Team: Malignant hematology team  PCP: Adam Ezra Sites, MD      HPI: 40 year old man with a diagnosis of B-ALL, diagnosed in October 2021.  Recently had an 8-day hospitalization, discharged on 3/26.    Current cancer-directed therapy: Blinatumomab (for MRD)    Overall feels like his pain is about back to normal. Has broken his foot 7 times from being in [employer]	[place], also has previous knee injuries as well as chronic low back pain.     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 Is prescribed Oxycontin 30mg  twice a day, but was not educated on how to take it. Has taken  only 3 of those pills since he left the hospital. Last night took a dose and felt like it made him a little loopy.     Has taken the Dilaudid a few times has typically taken during the day, last dose 2-3 days.     Has not been taking his Nortiptyline nightly. Has also has been taking Klonopin as needed, had not taken every day.     Symptom Review:  General: Anxiety continues to be his primary concern.  Generally feels like he is slowly getting better since leaving the hospital.  Pain: See above  Fatigue: Improving  Mobility: Limited by pain  Sleep: Poor.  Feels like cancer medication makes him feel hopped up  Appetite: Stable  Nausea: None  Bowel function: Irregular.  Only having bowel movements every 3 to 4 days.  Dyspnea: None  Secretions: None  Mood: Significant anxiety.  Struggling with not being able to just fix things.  Benefits from frequent reframing and rationalizing which help him put things in perspective.    Palliative Performance Scale: 80% - Ambulation: Full / Normal Activity with effort, some evidence of disease / Self-Care:Full / Intake: Normal or reduced / Level of Conscious: Full    Coping/Support Issues: Some difficulty coping with his current illness.  However, well supported by his wife.    Goals of Care: Focused on treatment and cancer directed therapy.    Social History: Lives in [place] with his wife Adam Keith.  He was in the [employer]	[place].  Enjoys woodworking.  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      Advance Care Planning: Did not discuss.  We will follow-up at future visits.  HCPOA:  Natural surrogate decision maker:  Living Will:  ACP note:     Objective     Opioid Risk Tool:    Male  Male    Family history of substance abuse      Alcohol  1  3    Illegal drugs  2  3    Rx drugs  4  4    Personal history of substance abuse      Alcohol  3  3    Illegal drugs  4  4    Rx drugs  5  5    Age between 69???45 years  1  1    History of preadolescent sexual abuse  3  0    Psychological disease      ADD, OCD, bipolar, schizophrenia  2  2    Depression  1  1       Total: Moderate risk  (<3 low risk, 4-7 moderate risk, >8 high risk)    Oncology History Overview Note   Referring/Local Oncologist: None    Diagnosis:Ph+ ALL    Genetics:    Karyotype/FISH:Abnormal Karyotype: 46,XY,t(9;22)(q34;q11.2)[1]/45,XY,der(7;9)(q10;q10)t(9;22)(q34;q11.2),der(22)t(9;22)[11]/46,sdl,+der(22)t(9;22)[5]/46,XY[3]     Abnormal FISH: A BCR/ABL1 interphase FISH assay shows an abnormal signal pattern in 97% of the 100 cells scored. 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  The findings support a diagnosis of leukemia and have implications for targeted therapy and for monitoring residual disease.      Molecular Genetics:  BCR-ABL1 p210 transcripts were detected at a level of 46.479 IS% ratio in bone marrow.  BCR-ABL1 p190 transcripts were detected at a level of 4 in 100,000 cells in bone  marrow.    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)     01/24/2020 - 04/02/2020 Chemotherapy    IP/OP LEUKEMIA GRAAPH-2005 + RITUXIMAB < 60 YO  rituximab hypercvad (odd and even course)     02/24/2020 Remission    CR with PCR MRD+; marrow showing 70% blasts with <1% blasts, negative flow MRD, BCR-ABL p210 PCR 0.197%; CNS negative for blasts     04/16/2020 Biopsy    BM Bx with 40-50% cellularity, <1% blasts, MRD flow cytometry negative, BCR-ABL p210 transcripts 0.036%     05/28/2020 - 05/28/2020 Chemotherapy    OP AML - CNS THERAPY (INTRATHECAL CYTARABINE, INTRATHECAL METHOTREXATE, OR INTRATHECAL TRIPLE)  Select one of the following: cytarabine IT 100 mg with hydrocortisone 50 mg, cytarabine IT 40 mg with methotrexate 15 mg with hydrocortisone 50 mg, OR methotrexate IT 12 mg with hydrocortisone 50 mg         Patient Active Problem List   Diagnosis   ??? Anxiety   ??? Tobacco use disorder   ??? AKI (acute kidney injury) (CMS-HCC)   ??? Transaminitis   ??? 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 Hyperphosphatemia   ??? MRSA bacteremia   ??? Septic shock due to Staphylococcus aureus (CMS-HCC)   ??? Hyperkalemia   ??? Hiccups   ??? Hypernatremia   ??? Red blood cell antibody positive   ??? Hypokalemia   ??? Hypomagnesemia   ??? COVID-19   ??? Dyspnea   ??? Depression with anxiety       Past Medical History:   Diagnosis Date   ??? 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 BONE MARROW BIOPSY & ASPIRATION  01/21/2020        ??? IR INSERT PORT AGE GREATER THAN 5 YRS  03/10/2020    IR INSERT PORT AGE GREATER THAN 5 YRS 03/10/2020 [name], MD IMG VIR [facility]&V [facility]       Current Outpatient Medications   Medication Sig Dispense Refill   ??? acetaminophen (TYLENOL) 325 MG tablet Take 650 mg by mouth every six (6) hours as needed for pain.      ??? amLODIPine (NORVASC) 10 MG tablet Take 1 tablet (10 mg total) by mouth daily. 30 tablet 0   ??? aspirin 81 MG chewable tablet Chew 1 tablet (81 mg total) daily. 36 tablet 0   ??? carvediloL (COREG) 12.5 MG tablet Take 1 tablet (12.5 mg total) by mouth Two (2) times a day. 60 tablet 3   ??? clonazePAM (KLONOPIN) 0.5 MG tablet Take 1 tablet (0.5 mg total) by mouth every twelve (12) hours for 7 days. May take an additional 1 tablet (0.5 mg total) by mouth 2 times a day as needed for anxiety. 28 tablet 0   ??? gabapentin (NEURONTIN) 300 MG capsule Take 1 capsule (300 mg total) by mouth Three (3) times a day. 90 capsule 0   ??? hydrOXYzine (ATARAX) 25 MG tablet Take 1 tablet (25 mg total) by mouth every six (6) hours as needed. 60 tablet 1   ??? nortriptyline (PAMELOR) 25 MG capsule Take 1 capsule (25 mg total) by mouth nightly. Start after initial  7 days of nortriptyline 10mg . 30 capsule 0   ??? ondansetron (ZOFRAN) 8 MG tablet Take 1 tablet (8 mg total) by mouth every eight (8) hours as needed for nausea (or itching). 60 tablet 2   ??? oxyCODONE (OXYCONTIN) 30 mg TR12 12 hr crush resistant ER/CR tablet Take 1 tablet (30 mg total) by mouth every twelve (12) hours. 14 tablet 0   ??? PONATinib (ICLUSIG) 30 mg tablet Take 1 tablet (30 mg total) by mouth daily. Swallow tablets whole. Do not crush, break, cut or chew tablets. 30 tablet 5   ??? predniSONE (DELTASONE) 10 MG tablet Take 4 tablets (40 mg total) by mouth daily for 5 days, THEN 3 tablets (30 mg total) daily for 7 days, THEN 2 tablets (20 mg total) daily for 7 days, THEN 1 tablet (10 mg total) daily for 7 days. 62 tablet 0   ??? prochlorperazine (COMPAZINE) 10 MG tablet Take 1 tablet (10 mg total) by mouth every six (6) hours as needed for nausea (if no relief from zofran (ondansetron)). 60 tablet 1   ??? sulfamethoxazole-trimethoprim (BACTRIM DS) 800-160 mg per tablet Take 1 tablet (160 mg of trimethoprim total) by mouth 2 times a day on Saturday, Sunday. For prophylaxis while on chemo. 48 tablet 3   ??? traMADoL (ULTRAM) 50 mg tablet 1-2 tabs po bid prn     ??? valACYclovir (VALTREX) 500 MG tablet Take 1 tablet (500 mg total) by mouth daily. 90 tablet 3   ??? clonazePAM (KLONOPIN) 0.5 MG tablet Take 1 tablet (0.5 mg total) by mouth two (2) times a day as needed for anxiety for up to 7 days. (Patient not taking: Reported on 07/06/2020) 14 tablet 0     No current facility-administered medications for this visit.       Allergies:   Allergies   Allergen Reactions   ??? Bupropion Hcl Other (See Comments)     Per patient out of touch with reality, suicidal, homicidal   ??? 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 Onion Anaphylaxis   ??? Vancomycin Analogues      Hearing loss with Lasix  Other reaction(s): Other (See Comments)  Hearing loss  lasix   ??? Bismuth Subsalicylate Nausea And Vomiting   ??? Ceftaroline Fosamil Rash and Other (See Comments)     Rash X 2 and pancytopenia 02/2020   ??? Furosemide      With Vancomycin caused hearing loss  Other reaction(s): Other (See Comments)  With Vancomycin caused hearing loss       Family History:  Cancer-related family history is negative for Melanoma.  He indicated that the status of his neg hx is unknown.      REVIEW OF SYSTEMS:  A comprehensive review of 14 systems was negative except for pertinent positives noted in HPI.      PHYSICAL EXAM:   Vital signs for this encounter: VS reviewed in EPIC.  GEN: Awake and alert, pleasant appearing male in no acute distress  PSYCH: Alert and oriented to person, place and time. Euthymic.  HEENT: Pupils equally round without scleral icterus. No facial asymmetry.  CV: Regular rhythm with normal rate, no murmurs.  LUNGS: No increased work of breathing.  ABD: Soft and non-tender with no distention  SKIN: No rashes, petechiae or jaundice noted  EXT: No edema noted of the lower extremities  NEURO: Normal gait and coordination.    Lab Results   Component Value Date  CREATININE 1.99 (H) 06/29/2020     Lab Results   Component Value Date    ALKPHOS 80 06/29/2020    BILITOT 0.4 06/29/2020    BILIDIR 0.10 06/27/2020    PROT 6.5 06/29/2020    ALBUMIN 3.6 06/29/2020    ALT 26 06/29/2020    AST 22 06/29/2020            I personally spent 50 minutes face-to-face and non-face-to-face in the care of this patient, which includes all pre, intra, and post visit time on the date of service.     Kathlynn Grate, MD  [facility] Outpatient Oncology Palliative Care

## 2020-07-06 NOTE — Unmapped (Signed)
Patient arrived to chair 12.  No complaints noted.  Access of port intact with blood return.   CADD pump connected at 1359 . All clamps opened, green light flashing, pump reads running, and home supplies sent home with patient.

## 2020-07-06 NOTE — Unmapped (Signed)
Pain:   -Restart Hydromorphone 2mg . Can take up to every 4 hours as needed.   -If taking more than 4 times a day or waking up overnight in pain we will restart Oxycontin  -Catalina Gravel will call you next week to check in about this    Constipation: Very common side effect of opioids  -Start Senna 1 tablet every night  -Goal of 1 BM every day. If no BM for 3 days send me a mychart message    Anxiety:   -Restart Nortriptyline 25mg  nightly. If making you too sleepy, send me a message and I can send in new prescription for lower dose    Call [phone] to get in touch with our clinic       Here is some additional information about [facility] Outpatient Oncology Palliative Care:     What is Palliative Care?  Palliative Care is medical care that focuses on managing symptoms of cancer or side effects from cancer treatment. The Outpatient Oncology Palliative Care service uses a team approach to help patients and their caregivers with the goal of maintaining the best quality of life possible during a cancer diagnosis and cancer treatment.   The Outpatient Oncology Palliative Care team serves all outpatient oncology clinics including surgery, gynecology, medicine and radiation oncology.      Who provides Outpatient Palliative Care?  The Outpatient Oncology Palliative Care team includes doctors, nurses, pharmacists, social workers, counselors and nutritionists. The team works with a patient's primary oncology team to create care plans that can help manage symptoms related to cancer and cancer treatment.     What services are offered by Outpatient Oncology Palliative Care?  We can help cancer patients with:  ?? Pain  ?? Symptoms such as nausea, vomiting, anxiety, depression, fatigue, loss of appetite of shortness of breath  ?? Emotional, psychological, or spiritual suffering  ?? Nutritional problems  ?? Concern about medical decisions  ?? Difficulty communicating values, goals and personal choices.     Who are the Outpatient Oncology Palliative Care team members?  Physicians: Mickie Hillier, MD; Kathlynn Grate, MD; Doreatha Martin, MD  Physician Fellows:  Ulanda Edison, MD; Theodoro Clock, MD; Remus Blake, MD; Payton Doughty, MD; Flora Lipps, MD  Nurse Practitioner: Burtis Junes, FNP  Clinical Pharmacist Practitioner:   Nurse Clinical Coordinator: Allegra Lai, RN, BSN, MS, OCN  Administrative Specialist: Dolores Frame     When can I see the Outpatient Oncology Palliative Care team and how do I make contact in between visits?  We see patients Monday through Friday 8am - 4:30pm. If you have questions or concerns during clinic hours, please contact us. Any requests made outside of business hours will be addressed the following business day.      For appointments & questions Monday through Friday 8 AM??? 4:30 PM:  please call [phone] or Toll free [phone].     On Nights, Weekends and Holidays:  Call [phone] and ask for the Oncologist on call.

## 2020-07-07 NOTE — Unmapped (Unsigned)
Adam [name] who presents for the follow-up of hearing loss. He was seen inpatient by our consult service. He has B cell ALL and was admitted for inpatient chemotherapy (vincristine, cytarabine, and dasatinib). His hospital course was complicated by respiratory failure, MRSA bactermia and candidemia treated by vancomycin and micafungin. 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 He was treated with high dose steroids and then underwent myringotomy and evacuation of bilateral serous effusions and right-sided intratympanic steroid injection on 12/14. 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 He was not interested in pursuing additional injections. He returns today for repeat audiogram.     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    Past Surgical History  His  has a past surgical history that includes Bone Marrow Biopsy & Aspiration (01/21/2020) and IR Insert Port Age Greater Than 5 Years (03/10/2020).    Past Family History  His family history is not on file.    Past Social History  He  reports that he quit smoking about 5 months ago. His smoking use included cigarettes. He smoked 2.00 packs per day. He has quit using smokeless tobacco.  His smokeless tobacco use included chew.    Medications/Allergies  His current medication(s) include:    Current Outpatient Medications:   ???  acetaminophen (TYLENOL) 325 MG tablet, Take 650 mg by mouth every six (6) hours as needed for pain. , Disp: , Rfl:   ???  amLODIPine (NORVASC) 10 MG tablet, Take 1 tablet (10 mg total) by mouth daily., Disp: 30 tablet, Rfl: 0  ???  aspirin 81 MG chewable tablet, Chew 1 tablet (81 mg total) daily., Disp: 36 tablet, Rfl: 0  ???  carvediloL (COREG) 12.5 MG tablet, Take 1 tablet (12.5 mg total) by mouth Two (2) times a day., Disp: 60 tablet, Rfl: 3  ???  clonazePAM (KLONOPIN) 0.5 MG tablet, Take 1 tablet (0.5 mg total) by mouth two (2) times a day as needed for anxiety for up to 7 days. (Patient not taking: Reported on 07/06/2020), Disp: 14 tablet, Rfl: 0  ???  clonazePAM (KLONOPIN) 0.5 MG tablet, Take 1 tablet (0.5 mg total) by mouth every twelve (12) hours for 7 days. May take an additional 1 tablet (0.5 mg total) by mouth 2 times a day as needed for anxiety., Disp: 28 tablet, Rfl: 0  ???  gabapentin (NEURONTIN) 300 MG capsule, Take 1 capsule (300 mg total) by mouth Three (3) times a day., Disp: 90 capsule, Rfl: 0  ???  HYDROmorphone (DILAUDID) 2 MG tablet, Take 1 tablet (2 mg total) by mouth every four (4) hours as needed for pain,severe (7-10) for up to 14 days., Disp: 56 tablet, Rfl: 0  ???  hydrOXYzine (ATARAX) 25 MG tablet, Take 1 tablet (25 mg total) by mouth every six (6) hours as needed., Disp: 60 tablet, Rfl: 1  ???  nortriptyline (PAMELOR) 25 MG capsule, Take 1 capsule (25 mg total) by mouth nightly. Start after initial 7 days of nortriptyline 10mg ., Disp: 30 capsule, Rfl: 0  ???  ondansetron (ZOFRAN) 8 MG tablet, Take 1 tablet (8 mg total) by mouth every eight (8) hours as needed for nausea (or itching)., Disp: 60 tablet, Rfl: 2  ???  oxyCODONE (OXYCONTIN) 30 mg TR12 12 hr crush  resistant ER/CR tablet, Take 1 tablet (30 mg total) by mouth every twelve (12) hours., Disp: 14 tablet, Rfl: 0  ???  PONATinib (ICLUSIG) 30 mg tablet, Take 1 tablet (30 mg total) by mouth daily. Swallow tablets whole. Do not crush, break, cut or chew tablets., Disp: 30 tablet, Rfl: 5  ???  predniSONE (DELTASONE) 10 MG tablet, Take 4 tablets (40 mg total) by mouth daily for 5 days, THEN 3 tablets (30 mg total) daily for 7 days, THEN 2 tablets (20 mg total) daily for 7 days, THEN 1 tablet (10 mg total) daily for 7 days., Disp: 62 tablet, Rfl: 0  ???  prochlorperazine (COMPAZINE) 10 MG tablet, Take 1 tablet (10 mg total) by mouth every six (6) hours as needed for nausea (if no relief from zofran (ondansetron))., Disp: 60 tablet, Rfl: 1  ???  sulfamethoxazole-trimethoprim (BACTRIM DS) 800-160 mg per tablet, Take 1 tablet (160 mg of trimethoprim total) by mouth 2 times a day on Saturday, Sunday. For prophylaxis while on chemo., Disp: 48 tablet, Rfl: 3  ???  traMADoL (ULTRAM) 50 mg tablet, 1-2 tabs po bid prn, Disp: , Rfl:   ???  valACYclovir (VALTREX) 500 MG tablet, Take 1 tablet (500 mg total) by mouth daily., Disp: 90 tablet, Rfl: 3  Allergies: Bupropion hcl, Dapsone, Onion, Vancomycin analogues, Bismuth subsalicylate, Ceftaroline fosamil, and Furosemide,    Review of systems   Review of systems was reviewed on attached notes/patient intake forms.      Physical Examination  There were no vitals taken for this visit.    General: well appearing, stated age, no distress   Head - atraumatic, normocephalic   Nose: dorsum midline, no rhinorrhea  Neck: symmetric, trachea midline  Psychiatric: alert and oriented, appropriate mood and affect   Respiratory: no audible wheezing or stridor, normal work of breathing  Neurologic - cranial nerves 2-12 grossly intact  Facial Strength - HB 1/6 bilaterally    Ears - External ear- normal, no lesions, no malformations   Otoscopy - ***  Tuning fork test ??? ***    Procedures  ***    Audiogram  The audiogram was reviewed. This demonstrates a ***    Tympanogram  ***    Imaging  I personally reviewed the patients imaging studies. MRI Brain 03/23/20 - bilateral mastoid effusions, normal inner ear anatomy, no evidence of CPA lesions, diffuse sinus thickening    I reviewed medical records scanned into media, pertinent findings are summarized in the HPI.    Assessment and Plan  The patient is a 40 y.o. male with ***    The patient/family voiced understanding of the plan as detailed above and is in agreement. I appreciate the opportunity to participate in his care.    I attest to the above information and documentation. However, this note has been created using voice recognition software and may have errors that were not dictated and not seen in editing.    Cheryl Flash MD  Assistant Professor   Division of Otology/Neurotology  Department of [place], [place], [place]

## 2020-07-08 ENCOUNTER — Ambulatory Visit: Admit: 2020-07-08 | Payer: MEDICAID | Attending: Audiologist | Primary: Audiologist

## 2020-07-10 MED ORDER — PREDNISONE 20 MG TABLET
ORAL_TABLET | Freq: Every day | ORAL | 0 refills | 7 days | Status: CN
Start: 2020-07-10 — End: 2020-07-17

## 2020-07-13 ENCOUNTER — Ambulatory Visit: Admit: 2020-07-13 | Discharge: 2020-07-14 | Payer: MEDICAID

## 2020-07-13 ENCOUNTER — Telehealth: Admit: 2020-07-13 | Discharge: 2020-07-14 | Payer: MEDICAID

## 2020-07-13 ENCOUNTER — Other Ambulatory Visit: Admit: 2020-07-13 | Discharge: 2020-07-14 | Payer: MEDICAID

## 2020-07-13 DIAGNOSIS — F4310 Post-traumatic stress disorder, unspecified: Principal | ICD-10-CM

## 2020-07-13 DIAGNOSIS — G4700 Insomnia, unspecified: Principal | ICD-10-CM

## 2020-07-13 DIAGNOSIS — C9100 Acute lymphoblastic leukemia not having achieved remission: Principal | ICD-10-CM

## 2020-07-13 DIAGNOSIS — F331 Major depressive disorder, recurrent, moderate: Principal | ICD-10-CM

## 2020-07-13 DIAGNOSIS — F419 Anxiety disorder, unspecified: Principal | ICD-10-CM

## 2020-07-13 LAB — COMPREHENSIVE METABOLIC PANEL
ALBUMIN: 3.8 g/dL (ref 3.4–5.0)
ALKALINE PHOSPHATASE: 70 U/L (ref 46–116)
ALT (SGPT): 18 U/L (ref 10–49)
ANION GAP: 8 mmol/L (ref 5–14)
AST (SGOT): 20 U/L (ref <=34)
BILIRUBIN TOTAL: 0.2 mg/dL — ABNORMAL LOW (ref 0.3–1.2)
BLOOD UREA NITROGEN: 26 mg/dL — ABNORMAL HIGH (ref 9–23)
BUN / CREAT RATIO: 13
CALCIUM: 9.2 mg/dL (ref 8.7–10.4)
CHLORIDE: 107 mmol/L (ref 98–107)
CO2: 23 mmol/L (ref 20.0–31.0)
CREATININE: 1.99 mg/dL — ABNORMAL HIGH
EGFR CKD-EPI AA MALE: 47 mL/min/{1.73_m2} — ABNORMAL LOW (ref >=60)
EGFR CKD-EPI NON-AA MALE: 41 mL/min/{1.73_m2} — ABNORMAL LOW (ref >=60)
GLUCOSE RANDOM: 110 mg/dL (ref 70–179)
POTASSIUM: 4.1 mmol/L (ref 3.4–4.8)
PROTEIN TOTAL: 6.4 g/dL (ref 5.7–8.2)
SODIUM: 138 mmol/L (ref 135–145)

## 2020-07-13 LAB — CBC W/ AUTO DIFF
BASOPHILS ABSOLUTE COUNT: 0.1 10*9/L (ref 0.0–0.1)
BASOPHILS RELATIVE PERCENT: 1 %
EOSINOPHILS ABSOLUTE COUNT: 0.5 10*9/L (ref 0.0–0.5)
EOSINOPHILS RELATIVE PERCENT: 4.5 %
HEMATOCRIT: 31.5 % — ABNORMAL LOW (ref 39.0–48.0)
HEMOGLOBIN: 10.8 g/dL — ABNORMAL LOW (ref 12.9–16.5)
LYMPHOCYTES ABSOLUTE COUNT: 3.4 10*9/L (ref 1.1–3.6)
LYMPHOCYTES RELATIVE PERCENT: 29.5 %
MEAN CORPUSCULAR HEMOGLOBIN CONC: 34.2 g/dL (ref 32.0–36.0)
MEAN CORPUSCULAR HEMOGLOBIN: 31.6 pg (ref 25.9–32.4)
MEAN CORPUSCULAR VOLUME: 92.3 fL (ref 77.6–95.7)
MEAN PLATELET VOLUME: 7.4 fL (ref 6.8–10.7)
MONOCYTES ABSOLUTE COUNT: 0.6 10*9/L (ref 0.3–0.8)
MONOCYTES RELATIVE PERCENT: 4.8 %
NEUTROPHILS ABSOLUTE COUNT: 7 10*9/L (ref 1.8–7.8)
NEUTROPHILS RELATIVE PERCENT: 60.2 %
PLATELET COUNT: 201 10*9/L (ref 150–450)
RED BLOOD CELL COUNT: 3.41 10*12/L — ABNORMAL LOW (ref 4.26–5.60)
RED CELL DISTRIBUTION WIDTH: 19.3 % — ABNORMAL HIGH (ref 12.2–15.2)
WBC ADJUSTED: 11.7 10*9/L — ABNORMAL HIGH (ref 3.6–11.2)

## 2020-07-13 LAB — PHOSPHORUS: PHOSPHORUS: 3.7 mg/dL (ref 2.4–5.1)

## 2020-07-13 LAB — SLIDE REVIEW

## 2020-07-13 LAB — MAGNESIUM: MAGNESIUM: 1.9 mg/dL (ref 1.6–2.6)

## 2020-07-13 MED ADMIN — blinatumomab (BLINCYTO) 28 mcg/day in sodium chloride 0.9% NON-PVC 0.9 % IVPB (7-DAY HOME INFUSION): 28 ug/d | INTRAVENOUS | @ 18:00:00 | Stop: 2020-07-20

## 2020-07-13 NOTE — Unmapped (Signed)
Labs found to be within parameters for treatment today. Request for drug sent to pharmacy.

## 2020-07-13 NOTE — Unmapped (Addendum)
[name]  Psychiatry   Established Patient E&M Service - Outpatient       Assessment:    Adam Keith presents for follow-up evaluation. He states that due to confusion about his discharge instructions after his recent hospitalization, he has only been taking his nortriptyline 25 mg for one week and does not recognize any improvement in his mood or anxiety. He also reports that he is experiencing insomnia most nights. Educated patient that it may take some time before he benefits from the nortriptyline. Also, educated patient that high doses of steroids can worsen anxiety and cause irritability, agitation, and contribute to insomnia. He was also prescribed clonazepam 0.5 mg tablets as needed for anxiety at discharge from the hospital. He expressed concern that the clonazepam may be too sedating, so I encouraged him to take 1/2 tablet to see if that provides benefit without the sedating side effects. He states that he is working with a Futures trader and is in agreement with working with me for medication management. Will reassess in 3 weeks.    Identifying Information:  Adam [name] with a history of PH+ B cell ALL, major depressive disorder, PTSD, anxiety, and insomnia. He has a long struggle with intermittent depressive episodes with one suicide attempt at age 36. He was recently hospitalized for chemotherapy and was started on nortriptyline and clonazepam to manage his symptoms. Recuperating from DRESS and currently on a prednisone taper.     Risk Assessment:  A suicide and violence risk assessment was performed as part of this evaluation. There patient is deemed to be at chronic elevated risk for self-harm/suicide given the following factors: current diagnosis of depression and cancer diagnosis in 01/2020 with multiple complications, death of his son in 06/21/19, and remote history of suicide attempt at age 68. The patient is deemed to be at chronic elevated risk for violence given the following factors: male gender. These risk factors are mitigated by the following factors:lack of active SI/HI, no know access to weapons or firearms, no history of violence, motivation for treatment, utilization of positive coping skills, supportive family, sense of responsibility to family and social supports, minor children living at home, presence of a significant relationship, presence of an available support system, enjoyment of leisure actvities, expresses purpose for living, current treatment compliance, effective problem solving skills and safe housing. There is no acute risk for suicide or violence at this time. The patient was educated about relevant modifiable risk factors including following recommendations for treatment of psychiatric illness and abstaining from substance abuse.    While future psychiatric events cannot be accurately predicted, the patient does not currently require acute inpatient psychiatric care and does not currently meet [facility] involuntary commitment criteria.      Plan:    Problem 1: Major Depressive Disorder  Status of problem: new problem to this provider  Interventions:   ?? Continue nortriptyline 25 mg. Patient has only been taking this medication for 1 week and states that he believes he is tolerating it well. He was confused after his recent discharge from the hospital on his medication regimen. Education provided on how it takes time to recognize benefit from antidepressant medications.   ?? Continue psychotherapy with local therapist    Problem 2: Anxiety  Status of problem: new problem to this provider  Interventions:   ?? See nortriptyline plan above  ?? OK to use hydroxyzine 25 mg for breakthrough anxiety  ?? OK to  continue clonazepam 0.5 mg tablets. Patient concerned that this medication may be too sedating after two episodes of wetting his bed at night. Encouraged him to take 1/2 tablet to see if it provides relief. Patient states that he does not want to feel drugged and is only using this medication sparingly.    Problem 3: Insomnia  Status of problem:  new problem to this provider  Interventions:   ?? See nortriptyline and clonazepam plans above  ?? Will plan to educate re: sleep hygiene      Psychotherapy provided:  No billable psychotherapy service provided.    Patient has been given this writer's contact information as well as the [facility] Psychiatry urgent line number. The patient has been instructed to call 911 for emergencies.      Subjective:    Chief complaint:  Follow-up psychiatric evaluation for depression, anxiety, and insomnia    Interval History:   He reports the following depressive and anxiety symptoms: depressed mood, low self-esteem, insomnia at times, lack of motivation, difficulty concentrating, excessive worry and anxiety, restlessness, racing thoughts at night, and irritability. He denies anhedonia and explained, I have a hard time because I can't do things that I enjoy because I tire out easily.  He also stated, I enjoy helping my friends out and now people Keith't ask me for help because they know I'm too sick to help.  He has been working with a Futures trader since the summer of 2021 due to depressive symptoms after the death of his son in 05/30/2019 from a gun shot wound. Expresses feelings of demoralization and frustration over not being able to care for his family in the manner he is accustomed to. 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 He reports a long-standing history of hypervigilance, avoidance, survivor's guilt, and intermittent truamatic memories consistent with chronic PTSD. He states that he tried to commit suicide via overdose at the age of 30 when struggling with PTSD and depression. He denies psychiatric follow-up after his suicide attempt. He states that he has struggled with depression intermittently throughout his adulthood, but does not feel that anxiety has been especially problematic until his cancer diagnosis. He denies panic attacks, mania, or psychosis. When I asked him about suicidal ideation he stated, I understand why people take their lives with cancer, but I'm not going to take mine. He stated, I wake up every day feeling different---it's frustrating--I can't plan anything.  His symptoms are most consistent with major depressive disorder, anxiety, and chronic PTSD.     He started nortriptyline 25 mg one week ago and has clonazepam 1 mg tablets PRN. He states that he is tolerating the nortriptyline well without any side effects. Using clonazepam 1 mg tablets sparingly since discharge from the hospital and explained that he doesn't want to feel drugged. He reports a couple of episodes of bed wetting when he used clonazepam 1 mg at bedtime. Sleep is interrupted with frequent urination. Most nights it is difficult for patient to get back to sleep, but he does have some nights where he can sleep 7 hour.    Prior Psychiatric Medication Trials: bupropion (could not tolerate), duloxetine, mirtazapine (given during recent hospitalization), lorazepam    Psychiatric Review of Systems  ??? Depressive Symptoms: depressed mood, difficulty concentrating, fatigue, feelings of worthlessness/guilt and insomnia  ??? Manic Symptoms: N/A  ??? Psychosis Symptoms: N/A  ??? Anxiety Symptoms: excessive anxiety  and worry, difficulty controlling worry, easily fatigued, difficulty concentrating, irritability, muscle tension, difficulty falling asleep, difficulty staying asleep, restless, unsatisfying sleep   ??? Panic Symptoms: denies  ??? PTSD Symptoms: hypervigilance, avoidance, survivor's guilt, and intermittent truamatic memories  ??? Sleep disturbance: frequent night time awakening, difficulty falling asleep and non-restful sleep   ??? Appetite: Increased appetite.  ??? Exercise: Unable to engage in a daily exercise routine.        Objective:      Mental Status Exam:  Appearance:    Appears stated age, Well nourished and Clean/Neat   Motor:   No abnormal movements   Speech/Language:    Normal rate, volume, tone, fluency   Mood:   Depressed   Affect:   Blunted, Calm, Cooperative and Depressed   Thought process and Associations:   Logical, linear, clear, coherent, goal directed   Abnormal/psychotic thought content:     Denies SI, HI, self harm, delusions, obsessions, paranoid ideation, or ideas of reference   Perceptual disturbances:     Denies auditory and visual hallucinations, behavior not concerning for response to internal stimuli     Other:   insight and judgment intact     I personally spent 100 minutes face-to-face and non-face-to-face in the care of this patient, which includes all pre, intra, and post visit time on the date of service.    Visit was completed by video (or phone) and the appropriate disclaimer has been included below.    The patient reports they are currently: at home. I spent 50 minutes on the real-time audio and video with the patient on the date of service. I spent an additional 50 minutes on pre- and post-visit activities on the date of service.     The patient was physically located in [place] or a state in which I am permitted to provide care. The patient and/or parent/guardian understood that s/he may incur co-pays and cost sharing, and agreed to the telemedicine visit. The visit was reasonable and appropriate under the circumstances given the patient's presentation at the time.    The patient and/or parent/guardian has been advised of the potential risks and limitations of this mode of treatment (including, but not limited to, the absence of in-person examination) and has agreed to be treated using telemedicine. The patient's/patient's family's questions regarding telemedicine have been answered.     If the visit was completed in an ambulatory setting, the patient and/or parent/guardian has also been advised to contact their provider???s office for worsening conditions, and seek emergency medical treatment and/or call 911 if the patient deems either necessary.      Sheran Lawless, PMHNP  07/13/2020

## 2020-07-13 NOTE — Unmapped (Signed)
1425: Patient present for [place] bag change, no acute concerns reported. Previous infusion running through lumen, blood return noted; NO forward flush performed. New bag connected, pump settings checked with 2nd RN. Patient discharged home alert, oriented and in stable condition with family present.

## 2020-07-13 NOTE — Unmapped (Signed)
It was a pleasure meeting you today. Here is our plan:    ?? Continue the nortriptyline 25 mg at bedtime  ?? Continue clonazepam 1 mg tablets as needed for anxiety or insomnia. I recommend that you cut the tablet in half to avoid over-sedation.  ?? Ok to use hydroxyzine for breakthrough anxiety as needed    Please reach out with any questions or concerns.    Best,  [name]      Comprehensive Cancer Support Program (CCSP) - Psychiatry Outpatient Clinic   After Visit Summary    It was a pleasure to see you today in the [facility]???s Comprehensive Cancer Support Program (CCSP). The CCSP is a multidisciplinary program dedicated to helping patients, caregivers, and families with cancer treatment, recovery and survivorship.      To schedule, cancel, or change your appointment:  Please call the [facility] schedulers at [phone], Monday through Friday 8AM - 5PM.  Someone will return your call within 24 hours.      If you have a question about your medicines or you need to contact your provider:  Please call the CCSP program coordinator, Myrene Galas, at [phone].     For after hours urgent issues, you may call [phone] or call the I need to talk line at [phone] [phone] anytime 24/7.    CCSP Patient and Family Resource Center: [phone].    CCSP Website:  [email or web address]    For prescription refills, please allow at least 24 hours (during business hours, M-F) for providers to call in refills to your pharmacy. We are generally unable to accommodate same-day requests for refills.     If you are taking any controlled substances (such as anxiety or sleep medications), you must use them as the directions say to use them. We generally do not provide early refills over the phone without clear reason, and it would be inappropriate to obtain the medications from other doctors. We routinely use the [place] controlled substance database to monitor prescription drug use.

## 2020-07-14 NOTE — Unmapped (Signed)
OUTPATIENT ONCOLOGY PALLIATIVE CARE    Adam [name] with Ph+ B-ALL. Called patient today via telephone to follow-up on pain management.    Interval History:  Adam Keith reports that he is hanging in there today. His pain is located in the lower back (more towards the middle and closer to the spine) and along his left leg, starting from his buttocks down to behind his left knee. Describes his pain as a dull aching pain throughout the day. Reports taking both hydromorphone and OxyContin (differs from Dr. Williemae Natter instructions). Was previously taking OxyContin in the daytime but it caused him to feel woozy so he switched to taking it at night. Currently taking hydromorphone (max of 1 tablet) during the day as needed. He was taking OxyContin as needed at night but notes that ~3 nights ago, he started taking it nightly to help with his pain and insomnia/feelings of anxiety. He is on a prednisone taper for DRESS; currently in his second to last week of treatment. When he feels a sharp pain, that is his trigger to take his pain medication; rates his pain at 8/10 prior and 5/10 afterwards. However, his pain is not controlled but it does knock the edge off. He expressed wanting to have better pain control closer to 0-3/10. Additionally, he has ~1 bowel movement per day and is taking 1 tablet of senna nightly.    Of note, he expresses that his anxiety is still up there. Confirmed with patient that he had restarted nortriptyline nightly 1 week ago. Reports that he does not feel any different but understands that nortriptyline will take a couple weeks to take into effect. Also mentioned that he had a psychiatry appointment yesterday and that his NP recommended taking clonazepam nightly to help with his anxiety until his next follow-up visit. States that he took 1 tablet last night and slept well; he did not feel sleepy this morning.    Plan:  1. Pain  - INCREASE hydromorphone to 2-4 mg q4h PRN depending on pain severity       - If pain score 4-6/10, take 2 mg (1 tablet) q4h PRN       - If pain score 7-10/10, take 4 mg (2 tablets) q4h PRN  - STOP OxyContin 30 mg nightly (currently has 4 tablets left)  - Encouraged to take Tylenol 650 mg q6h PRN  - Continue gabapentin 300 mg TID    2. Constipation  - Continue senna 1 tablet nightly    3. Anxiety - follows with psychiatry  - Continue nortriptyline 25 mg nightly  - Continue clonazepam to 0.5 mg nightly    F/u: Adam Keith on 07/16/20    Time spent: 30 minutes    Chilton Greathouse, PharmD  PGY-2 Hematology/Oncology Pharmacy Resident

## 2020-07-15 MED FILL — ICLUSIG 30 MG TABLET: ORAL | 30 days supply | Qty: 30 | Fill #1

## 2020-07-15 NOTE — Unmapped (Signed)
A replacement for Iclusig was sent and new expected delivery is 4/14.

## 2020-07-16 DIAGNOSIS — G893 Neoplasm related pain (acute) (chronic): Principal | ICD-10-CM

## 2020-07-16 MED ORDER — OXYCONTIN 15 MG TABLET,CRUSH RESISTANT,EXTENDED RELEASE
ORAL_TABLET | Freq: Every evening | ORAL | 0 refills | 15.00000 days | Status: CP
Start: 2020-07-16 — End: ?

## 2020-07-16 NOTE — Unmapped (Signed)
OUTPATIENT ONCOLOGY PALLIATIVE CARE    [name] with Ph+ B-ALL. Called patient today via telephone to follow-up on pain management.    Interval History:  [name] reports that he is feeling more pain. Two days ago, he reports that he takes OxyContin at night and it causes him to feel woozy but helps him through sleep. Since stopping OxyContin at night and increasing his dose of hydromorphone as needed two days ago (4/12), he endorses increased pain. States that it slightly helps with his pain but still just knocks the edge off. Yesterday, he took a total of 6 tablets of hydromorphone, 2 tablets in the morning, 2 tablets around 3-4PM, and 2 tablets at night. States that his pain is the highest at night when laying on his back and side, and he was up all night due to pain. He did not take any additional hydromorphone during the night and into the morning when his pain was at 6-8/10. Additionally, he continues to have ~1 bowel movement per day and is taking 1 tablet of senna nightly. Denies any drowsiness or sedation from clonazepam.    Of note, he expresses that he would like to go back to his long-acting OxyContin 30 mg as that was controlling his pain at night. Pt had previously been taking OxyContin 30 mg on a PRN bases instead of scheduled and has not been on scheduled OxyContin 30 mg BID in the past month. Extensive discussion with pt on the risks/benefits of long-acting opioids when he is using so few PRN hydromorphone doses; shared my concern that we would cause too much sedation and adverse effects with dose of OxyContin 30 mg BID. Discussed with him that plan would be start with lower dose that matches his PRN use now, then uptitrate gradually.  He is amenable to trialing OxyContin at 15 mg nightly and continuing hydromorphone as needed. Patient verbalized understanding of plan.    Plan:  1. Pain  - Continue hydromorphone to 2-4 mg q4h PRN depending on pain severity - If pain score 4-6/10, take 2 mg (1 tablet) q4h PRN       - If pain score 7-10/10, take 4 mg (2 tablets) q4h PRN  - START OxyContin 15 mg nightly (15-day supply sent on 4/14)  - Encouraged to take Tylenol 650 mg q6h PRN  - Continue gabapentin 300 mg TID    2. Constipation  - Continue senna 1 tablet nightly    3. Anxiety - follows with psychiatry  - Continue nortriptyline 25 mg nightly  - Continue clonazepam to 0.5 mg nightly    F/u:   - 07/20/20 phone check in with [name], CPP  - 08/03/20 with [name], PMHNP    Time spent: 35 minutes    Chilton Greathouse, PharmD  PGY-2 Hematology/Oncology Pharmacy Resident     I participated in direct care for this patient; discussed new plan of resuming OxyContin 15 mg nightly over the phone with him along with pharmacy resident.    [name], PharmD, CPP  Outpatient Oncology Palliative Care  July 16, 2020 5:04 PM

## 2020-07-17 ENCOUNTER — Ambulatory Visit
Admit: 2020-07-17 | Discharge: 2020-07-21 | Discharge status: Against Medical Advice | Payer: MEDICAID | Admitting: Hematology & Oncology

## 2020-07-17 DIAGNOSIS — C9500 Acute leukemia of unspecified cell type not having achieved remission: Principal | ICD-10-CM

## 2020-07-17 LAB — COMPREHENSIVE METABOLIC PANEL
ALBUMIN: 4.2 g/dL (ref 3.4–5.0)
ALKALINE PHOSPHATASE: 69 U/L (ref 46–116)
ALT (SGPT): 15 U/L (ref 10–49)
ANION GAP: 11 mmol/L (ref 5–14)
AST (SGOT): 13 U/L (ref <=34)
BILIRUBIN TOTAL: 0.5 mg/dL (ref 0.3–1.2)
BLOOD UREA NITROGEN: 17 mg/dL (ref 9–23)
BUN / CREAT RATIO: 8
CALCIUM: 9.2 mg/dL (ref 8.7–10.4)
CHLORIDE: 103 mmol/L (ref 98–107)
CO2: 22 mmol/L (ref 20.0–31.0)
CREATININE: 2.07 mg/dL — ABNORMAL HIGH
EGFR CKD-EPI AA MALE: 45 mL/min/{1.73_m2} — ABNORMAL LOW (ref >=60)
EGFR CKD-EPI NON-AA MALE: 39 mL/min/{1.73_m2} — ABNORMAL LOW (ref >=60)
GLUCOSE RANDOM: 83 mg/dL (ref 70–179)
POTASSIUM: 4.2 mmol/L (ref 3.4–4.8)
PROTEIN TOTAL: 6.8 g/dL (ref 5.7–8.2)
SODIUM: 136 mmol/L (ref 135–145)

## 2020-07-17 LAB — CBC W/ AUTO DIFF
BASOPHILS ABSOLUTE COUNT: 0.1 10*9/L (ref 0.0–0.1)
BASOPHILS RELATIVE PERCENT: 0.5 %
EOSINOPHILS ABSOLUTE COUNT: 0.2 10*9/L (ref 0.0–0.5)
EOSINOPHILS RELATIVE PERCENT: 1.1 %
HEMATOCRIT: 35.1 % — ABNORMAL LOW (ref 39.0–48.0)
HEMOGLOBIN: 11.8 g/dL — ABNORMAL LOW (ref 12.9–16.5)
LYMPHOCYTES ABSOLUTE COUNT: 2.3 10*9/L (ref 1.1–3.6)
LYMPHOCYTES RELATIVE PERCENT: 15.5 %
MEAN CORPUSCULAR HEMOGLOBIN CONC: 33.6 g/dL (ref 32.0–36.0)
MEAN CORPUSCULAR HEMOGLOBIN: 31 pg (ref 25.9–32.4)
MEAN CORPUSCULAR VOLUME: 92.3 fL (ref 77.6–95.7)
MEAN PLATELET VOLUME: 7.5 fL (ref 6.8–10.7)
MONOCYTES ABSOLUTE COUNT: 1.3 10*9/L — ABNORMAL HIGH (ref 0.3–0.8)
MONOCYTES RELATIVE PERCENT: 8.6 %
NEUTROPHILS ABSOLUTE COUNT: 10.8 10*9/L — ABNORMAL HIGH (ref 1.8–7.8)
NEUTROPHILS RELATIVE PERCENT: 74.3 %
PLATELET COUNT: 162 10*9/L (ref 150–450)
RED BLOOD CELL COUNT: 3.8 10*12/L — ABNORMAL LOW (ref 4.26–5.60)
RED CELL DISTRIBUTION WIDTH: 18.9 % — ABNORMAL HIGH (ref 12.2–15.2)
WBC ADJUSTED: 14.6 10*9/L — ABNORMAL HIGH (ref 3.6–11.2)

## 2020-07-17 LAB — MAGNESIUM: MAGNESIUM: 1.8 mg/dL (ref 1.6–2.6)

## 2020-07-17 LAB — LACTATE SEPSIS, VENOUS: LACTATE BLOOD VENOUS: 2.1 mmol/L — ABNORMAL HIGH (ref 0.5–1.8)

## 2020-07-17 LAB — HIGH SENSITIVITY TROPONIN I - SINGLE: HIGH SENSITIVITY TROPONIN I: 18 ng/L (ref <=53)

## 2020-07-17 MED ORDER — PREDNISONE 10 MG TABLET
ORAL_TABLET | Freq: Every day | ORAL | 0 refills | 7 days | Status: CN
Start: 2020-07-17 — End: 2020-07-24

## 2020-07-17 MED ORDER — CARVEDILOL 12.5 MG TABLET
ORAL_TABLET | 1 refills | 0 days
Start: 2020-07-17 — End: ?

## 2020-07-18 LAB — BASIC METABOLIC PANEL
ANION GAP: 7 mmol/L (ref 5–14)
BLOOD UREA NITROGEN: 18 mg/dL (ref 9–23)
BUN / CREAT RATIO: 9
CALCIUM: 9.5 mg/dL (ref 8.7–10.4)
CHLORIDE: 104 mmol/L (ref 98–107)
CO2: 26 mmol/L (ref 20.0–31.0)
CREATININE: 2.1 mg/dL — ABNORMAL HIGH
EGFR CKD-EPI AA MALE: 44 mL/min/{1.73_m2} — ABNORMAL LOW (ref >=60)
EGFR CKD-EPI NON-AA MALE: 38 mL/min/{1.73_m2} — ABNORMAL LOW (ref >=60)
GLUCOSE RANDOM: 90 mg/dL (ref 70–179)
POTASSIUM: 5.1 mmol/L — ABNORMAL HIGH (ref 3.4–4.8)
SODIUM: 137 mmol/L (ref 135–145)

## 2020-07-18 LAB — URINALYSIS WITH CULTURE REFLEX
BACTERIA: NONE SEEN /HPF
BILIRUBIN UA: NEGATIVE
GLUCOSE UA: NEGATIVE
KETONES UA: NEGATIVE
LEUKOCYTE ESTERASE UA: NEGATIVE
NITRITE UA: NEGATIVE
PH UA: 5 (ref 5.0–9.0)
PROTEIN UA: 30 — AB
RBC UA: 1 /HPF (ref <=3)
SPECIFIC GRAVITY UA: 1.011 (ref 1.003–1.030)
SQUAMOUS EPITHELIAL: 1 /HPF (ref 0–5)
UROBILINOGEN UA: 0.2
WBC UA: 1 /HPF (ref <=2)

## 2020-07-18 LAB — CBC
HEMATOCRIT: 36.5 % — ABNORMAL LOW (ref 39.0–48.0)
HEMOGLOBIN: 12.1 g/dL — ABNORMAL LOW (ref 12.9–16.5)
MEAN CORPUSCULAR HEMOGLOBIN CONC: 33.3 g/dL (ref 32.0–36.0)
MEAN CORPUSCULAR HEMOGLOBIN: 30.8 pg (ref 25.9–32.4)
MEAN CORPUSCULAR VOLUME: 92.7 fL (ref 77.6–95.7)
MEAN PLATELET VOLUME: 7.5 fL (ref 6.8–10.7)
PLATELET COUNT: 160 10*9/L (ref 150–450)
RED BLOOD CELL COUNT: 3.94 10*12/L — ABNORMAL LOW (ref 4.26–5.60)
RED CELL DISTRIBUTION WIDTH: 19.4 % — ABNORMAL HIGH (ref 12.2–15.2)
WBC ADJUSTED: 19.8 10*9/L — ABNORMAL HIGH (ref 3.6–11.2)

## 2020-07-18 LAB — PHOSPHORUS: PHOSPHORUS: 3.9 mg/dL (ref 2.4–5.1)

## 2020-07-18 LAB — CK: CREATINE KINASE TOTAL: 38 U/L — ABNORMAL LOW

## 2020-07-18 LAB — ADDON DIFFERENTIAL ONLY
BASOPHILS ABSOLUTE COUNT: 0.2 10*9/L — ABNORMAL HIGH (ref 0.0–0.1)
BASOPHILS RELATIVE PERCENT: 0.9 %
EOSINOPHILS ABSOLUTE COUNT: 0.2 10*9/L (ref 0.0–0.5)
EOSINOPHILS RELATIVE PERCENT: 0.9 %
LYMPHOCYTES ABSOLUTE COUNT: 2.9 10*9/L (ref 1.1–3.6)
LYMPHOCYTES RELATIVE PERCENT: 15.5 %
MONOCYTES ABSOLUTE COUNT: 1.3 10*9/L — ABNORMAL HIGH (ref 0.3–0.8)
MONOCYTES RELATIVE PERCENT: 7.1 %
NEUTROPHILS ABSOLUTE COUNT: 14.1 10*9/L — ABNORMAL HIGH (ref 1.8–7.8)
NEUTROPHILS RELATIVE PERCENT: 75.6 %

## 2020-07-18 LAB — C-REACTIVE PROTEIN
C-REACTIVE PROTEIN: 201 mg/L — ABNORMAL HIGH (ref <=10.0)
C-REACTIVE PROTEIN: 256 mg/L — ABNORMAL HIGH (ref <=10.0)

## 2020-07-18 LAB — LACTATE DEHYDROGENASE: LACTATE DEHYDROGENASE: 207 U/L (ref 120–246)

## 2020-07-18 LAB — SEDIMENTATION RATE: ERYTHROCYTE SEDIMENTATION RATE: 30 mm/h — ABNORMAL HIGH (ref 0–15)

## 2020-07-18 LAB — URIC ACID: URIC ACID: 7.5 mg/dL

## 2020-07-18 LAB — SLIDE REVIEW

## 2020-07-18 LAB — MAGNESIUM: MAGNESIUM: 1.6 mg/dL (ref 1.6–2.6)

## 2020-07-18 LAB — LACTATE SEPSIS, VENOUS 2: LACTATE BLOOD VENOUS: 1.4 mmol/L (ref 0.5–1.8)

## 2020-07-18 LAB — TSH: THYROID STIMULATING HORMONE: 2.207 u[IU]/mL (ref 0.550–4.780)

## 2020-07-18 MED ADMIN — cefepime (MAXIPIME) 2 g in sodium chloride 0.9 % (NS) 100 mL IVPB-connector bag: 2 g | INTRAVENOUS | @ 02:00:00 | Stop: 2020-07-17

## 2020-07-18 MED ADMIN — HYDROmorphone (DILAUDID) tablet 2 mg: 2 mg | ORAL | @ 13:00:00 | Stop: 2020-07-18

## 2020-07-18 MED ADMIN — amLODIPine (NORVASC) tablet 10 mg: 10 mg | ORAL | @ 17:00:00

## 2020-07-18 MED ADMIN — HYDROmorphone (DILAUDID) tablet 4 mg: 4 mg | ORAL | @ 21:00:00 | Stop: 2020-07-18

## 2020-07-18 MED ADMIN — acetaminophen (TYLENOL) tablet 1,000 mg: 1000 mg | ORAL | @ 04:00:00 | Stop: 2020-07-17

## 2020-07-18 MED ADMIN — DAPTOmycin (CUBICIN) 650 mg in sodium chloride (NS) 0.9 % 50 mL IVPB: 6 mg/kg | INTRAVENOUS | @ 01:00:00 | Stop: 2020-07-17

## 2020-07-18 MED ADMIN — HYDROmorphone (DILAUDID) tablet 2 mg: 2 mg | ORAL | @ 01:00:00 | Stop: 2020-07-17

## 2020-07-18 MED ADMIN — valACYclovir (VALTREX) tablet 500 mg: 500 mg | ORAL | @ 13:00:00

## 2020-07-18 MED ADMIN — oxyCODONE (ROXICODONE) immediate release tablet 10 mg: 10 mg | ORAL | @ 07:00:00 | Stop: 2020-07-18

## 2020-07-18 MED ADMIN — gabapentin (NEURONTIN) capsule 300 mg: 300 mg | ORAL | @ 17:00:00

## 2020-07-18 MED ADMIN — predniSONE (DELTASONE) tablet 10 mg: 10 mg | ORAL | @ 13:00:00

## 2020-07-18 MED ADMIN — aspirin chewable tablet 81 mg: 81 mg | ORAL | @ 13:00:00

## 2020-07-18 MED ADMIN — HYDROmorphone (DILAUDID) tablet 4 mg: 4 mg | ORAL | @ 17:00:00 | Stop: 2020-07-18

## 2020-07-18 MED ADMIN — oxyCODONE (ROXICODONE) immediate release tablet 10 mg: 10 mg | ORAL | @ 11:00:00 | Stop: 2020-07-18

## 2020-07-18 MED ADMIN — carvediloL (COREG) tablet 12.5 mg: 12.5 mg | ORAL | @ 17:00:00

## 2020-07-18 MED ADMIN — lactated ringers bolus 2,631 mL: 30 mL/kg | INTRAVENOUS | @ 01:00:00 | Stop: 2020-07-17

## 2020-07-18 MED ADMIN — HYDROmorphone (DILAUDID) tablet 2 mg: 2 mg | ORAL | @ 09:00:00 | Stop: 2020-07-18

## 2020-07-18 MED ADMIN — oxyCODONE (ROXICODONE) immediate release tablet 10 mg: 10 mg | ORAL | @ 15:00:00 | Stop: 2020-07-18

## 2020-07-18 MED ADMIN — HYDROmorphone (DILAUDID) tablet 2 mg: 2 mg | ORAL | @ 03:00:00 | Stop: 2020-07-17

## 2020-07-18 MED ADMIN — PONATinib (ICLUSIG) tablet 30 mg **PATIENT SUPPLIED**: 30 mg | ORAL | @ 20:00:00

## 2020-07-18 MED ADMIN — gabapentin (NEURONTIN) capsule 300 mg: 300 mg | ORAL | @ 13:00:00

## 2020-07-18 MED ADMIN — cefepime (MAXIPIME) 2 g in sodium chloride 0.9 % (NS) 100 mL IVPB-connector bag: 2 g | INTRAVENOUS | @ 20:00:00 | Stop: 2020-07-23

## 2020-07-18 NOTE — Unmapped (Signed)
IMMUNOCOMPROMISED HOST INFECTIOUS DISEASE CONSULT NOTE    Adam Keith is being seen in consultation at the request of Avie Arenas,* for evaluation of pulmonary nodules.    Assessment  40 y.o. male  1. 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   -now day 27 of cycle 1 of Blinatumomab (anti-CD19/CD3)  - Intrathecal??ppx (rare blast on prior CSF), last 03/13/2020  - 04/16/2020 BMBx shows remission with negative MRD however with minimal BCRABL remaining  - Right IJ Port-A-Cath placed 04/15/2020  2.Drug Intolerances/allergies  Ceftaroline - rash and leukopenia  Dapsone - possible leukopenia  Vancomycin - probable ototoxicity (with furosemide)  Isavuconazole - elevated LFTs in the setting of TKI   3. 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 tree-in-bud nodularity, resolving on CT 04/28/20  7. COVID-19 05/28/20  8. Now presents with non-neutropenic fever and new pulmonary nodules/infiltrates    Recommendations:  Diagnostically:  awaiting CT chest read  consider pulmonary consultation for possible bronchoscopy with BAL  -blood cultures x2 pending    please check  -legionella antigen urine  -histoplasma antigen urine  -cryptococcal antigen serum  -galactomannan serum  -respiratory pathogen panel by nasopharyngeal swab  -CMV VL    Therapeutically:  DC daptomycin  cont cefepime  start posaconazole 300mg  bid orally for two loading doses, followed by 300mg  qd.    Leanord Hawking  Immunocompromised Host Infectious Diseases  Pager [phone]    HPI  Pt is a 40 yo gentleman who is well know to our service. He has a history of candidemia, MRSA IE. He completed posaconazole treatment on 05/13/20.  He developed COVID-19 on 05/28/20.   He is now admitted with new fevers that started yesterday morning. Over the last weeks, he has also developed sharp chest pains that last a few minutes. he has not had these pains previously. Not exacerbated by breathing, sometimes triggered by drinking. Not exercise related. He has a dry mild cough. no other respiratory symptoms.  Denies other new symptoms.  chronic low back pain is unchanged    Allergies:  Bupropion hcl, Dapsone, Onion, Vancomycin analogues, Bismuth subsalicylate, Ceftaroline fosamil, and Furosemide    Medications:   Scheduled Meds:  ??? amLODIPine  10 mg Oral Daily   ??? aspirin  81 mg Oral Daily   ??? carvediloL  12.5 mg Oral BID   ??? Cefepime  2 g Intravenous Q12H   ??? clonazePAM  0.5 mg Oral Nightly   ??? gabapentin  300 mg Oral TID   ??? nortriptyline  25 mg Oral Nightly   ??? polyethylene glycol  17 g Oral Daily   ??? PONATinib  30 mg Oral Q24H   ??? predniSONE  10 mg Oral Daily   ??? sulfamethoxazole-trimethoprim  1 tablet Oral BID   ??? [START ON 07/20/2020] sulfamethoxazole-trimethoprim  1 tablet Oral Once   ??? valACYclovir  500 mg Oral Daily     Continuous Infusions:  ??? IP okay to treat       PRN Meds:.aluminum-magnesium hydroxide-simethicone, clonazePAM, guaiFENesin, HYDROmorphone, IP okay to treat, melatonin, ondansetron **OR** ondansetron, ondansetron, senna    Other medications reviewed.     Medical History:  Past Medical History:   Diagnosis Date   ???  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 BONE MARROW BIOPSY & ASPIRATION  01/21/2020        ??? IR INSERT PORT AGE GREATER THAN 5 YRS  03/10/2020    IR INSERT PORT AGE GREATER THAN 5 YRS 03/10/2020 [name], MD IMG VIR H&V [facility]       Social History:  Tobacco use:    reports that he quit smoking about 5 months ago. His smoking use included cigarettes. He smoked 2.00 packs per day. He has quit using smokeless tobacco.  His smokeless tobacco use included chew.          Alcohol use:  ??  has no history on file for alcohol use.   Drug use:  ??  has no history on file for drug use.   Living situation: ?? Lives with spouse/partner   Residence:  ?? [place]   Birth place ?? ??   [place] travel:  ?? No [place] travel outside of [address] travel:  ?? No travel outside of the [employer]: ?? 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 Animal exposures include cats, dogs   Insect exposure: ?? No tick exposure   Hobbies: ?? Denies unusual environmental exposures   TB exposures: ?? No known TB exposure   Sexual history: ?? ??   Other significant exposures: ?? No exposure to well water, No exposure to raw/undercooked foods, No exposure to young children and Never incarcerated       Family History:  Family History   Problem Relation Age of Onset   ??? Melanoma Neg Hx    ??? Basal cell carcinoma Neg Hx    ??? Squamous cell carcinoma Neg Hx        Immunizations:  Immunization History   Administered Date(s) Administered   ??? COVID-19 VACCINE,MRNA(MODERNA)(PF)(IM) 02/25/2020, 03/23/2020       Review of Systems:  10 systems reviewed and negative except as per HPI.     Physical Exam:  Temp:  [36.1 ??C (97 ??F)-38.8 ??C (101.9 ??F)] 36.1 ??C (97 ??F)  Heart Rate:  [103-139] 104  Resp:  [20-38] 20  BP: (121-175)/(78-100) 132/78  MAP (mmHg):  [96-121] 96  SpO2:  [90 %-100 %] 94 %  heent no thrush, no oral ulcers  Conjunctivae clear  Neck supple  Spine non-tender  No CVA tenderness  Lungs cta  Heart s1,s2, no murmurs  abd soft, nt/nd  Ext no joint effusions  No edema  No rashes  Neuro: AOx3, no abnormal movements noted    Labs:  Lab Results   Component Value Date    WBC 19.8 (H) 07/18/2020    WBC 14.6 (H) 07/17/2020    WBC 11.7 (H) 07/13/2020    WBC 16.7 (H) 07/06/2020    WBC 25.6 (H) 06/29/2020 Absolute Neutrophils 14.1 (H) 07/18/2020    Absolute Neutrophils 10.8 (H) 07/17/2020    Absolute Neutrophils 7.0 07/13/2020    Absolute Eosinophils 0.2 07/18/2020    Absolute Eosinophils 0.2 07/17/2020    Platelet 160 07/18/2020     Lab Results   Component Value Date    Creatinine 2.10 (H) 07/18/2020    Creatinine 2.07 (H) 07/17/2020    Creatinine 1.99 (H) 07/13/2020  Creatinine 1.92 (H) 07/06/2020    Creatinine 1.99 (H) 06/29/2020     Lab Results   Component Value Date    CRP 256.0 (H) 07/18/2020     Lab Results   Component Value Date    Total Bilirubin 0.5 07/17/2020    AST 13 07/17/2020    ALT 15 07/17/2020    Alkaline Phosphatase 69 07/17/2020    Alkaline Phosphatase 70 07/13/2020    Lactate, Arterial 1.6 (H) 01/31/2020    Lactate, Arterial 2.6 (H) 01/30/2020    Lactate, Arterial 2.9 (H) 01/28/2020    Lactate, Venous 1.4 07/18/2020    Lactate, Venous 2.1 (H) 07/17/2020    Lactate, Venous 0.7 05/31/2020       Microbiology:    flu/RSV/COVID neg  blood culture pending    Studies:   CT chest pending  on my read several new nodular opacities with surrounding groundglass primarily in upper lobes

## 2020-07-18 NOTE — Unmapped (Signed)
Pt here wit c/o flu like sx  Pt spoke with Onc and was told to come in   Pt on the chemo tx on going - home pump -

## 2020-07-18 NOTE — Unmapped (Signed)
Hematology Resident (MEDE) History & Physical    Assessment & Plan:   Adam [name] with B-ALL on blinatumomab infusion, major depressive disorder, PTSD, anxiety, and insomnia. Presents with fever, leukocytosis, tachycardia with diffuse myalgias concerning for sepsis.    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 *    #Fever, c/f sepsis vs c/f cytokine release syndrome: Febrile, tachycardic, tachypnea, labs with leukocytosis (although on steroids) with generalized myalgias concerning for underlying infection in immunocompromised patient (onging chemotherapy, prednisone taper). He has history of MRSA bacteremia which required ICU admission, per chart review, with similar initial presentation. Hx vancomycin allergy (sensorineural hearing loss, concern 2/2 vanc), so started on dapto/meropenem. Consider cytokine release syndrome as well which can occur on blinatumomab, although has been on current chemotherapy regimen since 3/21. Monitor for nausea, fever, hypotension, acute changes in mental status or electrolyte abnormalities. If concern for CRS secondary to Blinatumomab, would potentially need dexamethasone 8 mg q8h x 3 days then taper over 4 days.  -Blood culture, urine culture  -Continue Dapto, meropenem  -Qshift neuro checks  -Daily CMP, CBC/Diff    #Ph+ B-ALL Diagnosed in 01/2020 s/p [id number] induction. Disease course c/b septic shock candida krusei fungemia, MRSA bacteremia with septic emboli c/b acute renal failure and respiratory distress requiring dialysis and intubation.  Post-induction bmbx (day 29) demonstrated MRD-negative remission but low-level BCR-ABL persisted. Ultimately due to CKD, performance status, COVID infection and significant complications noted above. Cycle 2 of HyperCVAD was delayed due to CKD, performance status, COVID infection and prior sepsis, and he was started on Blinatumomab, cycle 1D1=3/21. Had side effect of burning skin, itching, edema which were managed with premedication. Continued ponatinib and started ASA. Primary Oncologist: [name].  -Hold bactrim while on dapto/meropenem  -Patient to bring in home ponatinib  -Cont ASA  -Continue Valtrex    #MDD, anxiety (chronic): death of his son in 2019/06/17, and remote history of suicide attempt at age 21. Recently started nortriptyline 25 mg   Clonazepam prn, per pt, sedating, will hold.  -continue nortriptyline  -consider propanolol 10 to 20 mg every 8 hours as needed  -consider Klonopin to 0.5 mg BID, hold if sedation  -consider psychiatry consult    #Chronic pain: follows with palliative care at [facility]. Has not yet started oxycontin, so will hold here. Previously received benefit with oral dilaudid. Will restart here and titrate. Hx opioid induced constipation, typically BM every 2 days.   -dilaudid 2 mg q4h prn   -Miralax, senna    #AKI to CKD: Initial insult secondary to sepsis. Now s/p dialysis with CKD and stable Cr 2.10 (at baseline) this admission. Monitor.    #Hx DRESS syndrome:  Followed by Dr. Caryn Section with [facility]. S/p punch biopsy on 3/11. Onset proximate to dose adjustment of dasatinib, and administration of posaconazole and sotrovimab. Currently on prednisone taper: 40mg  daily x 7 (3/25-3/31); 30mg  dalily x 7 (4/1- 4/7); 20mg  daily x 7 (4/8-4/14); 10 mg daily x 7 (4/15-4/21).  -Prednisone 10 mg daily  -PRN hydroxyzine    #Peripheral neuropathy: Stable since starting chemotherapy. 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 Hypersensitivity of hearing in left ear.   -Avoid lasix/loop  diuretics, other ototoxic medications    #Hypertension: Chronic since B-ALL diagnosis. Hold amlodipine, coreg given concern for infection.    Daily Checklist:  Diet: Regular Diet  DVT PPx: SCDs  Electrolytes: Replete Potassium to >/= 3.6 and Magnesium to >/= 1.8  Code Status: Full Code    Chief Concern:   Fever    Subjective:   HPI:  Adam [name] with B-ALL on blinatumomab infusion, major depressive disorder, PTSD, anxiety, and insomnia.     He presents with fever, diffuse myalgias and gradual onset headache since this morning. Patient's wife, a Charity fundraiser, took his temperature at home and noted axillary Tmax 103.6??F. Symptoms prior to admission were notable for chronic cough, shortness of breath and mild sore throat. Patient denies chest pain, episodes of emesis, abdominal discomfort, change in bowel movements. Denies dysuria, hematuria or flank pain. Does endorse his chronic back pain (takes dilaudid 2 mg at home). Denies new confusion or increased sedation from his baseline. He is currently completing a prednisone taper for recent DRESS syndrome. Has been taking Bactrim and per chart has been on valtrex for prophylaxis.     In the ED: Patient is afebrile, normotensive to hypertensive, tachycardic HR 110s, tachypneic RR 32. Labs with leukocytosis WBC 14.6, Hgb 11.8. CMP, Cr: 2.07 (at baseline), Lactate 2.1 -> 1.4. Troponin wnr, ECG with sinus tachycardia. CXR without acute airspace disease. ECHO (04/2020) with EF 60%. Started on dapto, cefepime (vacomycin allergy).    Designated Environmental health practitioner:  Adam Keith currently has decisional capacity for healthcare decision-making and is able to designate a surrogate healthcare decision maker. Adam Keith designated healthcare decision maker(s) is/are Adam Keith, spouse per patient's stated preference.    Allergies:  Bupropion hcl, Dapsone, Onion, Vancomycin analogues, Bismuth subsalicylate, Ceftaroline fosamil, and Furosemide    Medications:   Prior to Admission medications    Medication Dose, Route, Frequency   acetaminophen (TYLENOL) 325 MG tablet 650 mg, Oral, Every 6 hours PRN   amLODIPine (NORVASC) 10 MG tablet 10 mg, Oral, Daily (standard)   aspirin 81 MG chewable tablet 81 mg, Oral, Daily (standard)   carvediloL (COREG) 12.5 MG tablet 12.5 mg, Oral, 2 times a day (standard)   clonazePAM (KLONOPIN) 0.5 MG tablet 0.5 mg, Oral, 2 times a day PRN  Patient not taking: Reported on 07/06/2020   clonazePAM (KLONOPIN) 0.5 MG tablet Take 1 tablet (0.5 mg total) by mouth every twelve (12) hours for 7 days. May take an additional 1 tablet (0.5 mg total) by mouth 2 times a day as needed for anxiety.   gabapentin (NEURONTIN) 300 MG capsule 300 mg, Oral, 3 times a day (standard)   HYDROmorphone (DILAUDID) 2 MG tablet 2 mg, Oral, Every 4 hours PRN   hydrOXYzine (ATARAX) 25 MG tablet 25 mg, Oral, Every 6 hours PRN   nortriptyline (PAMELOR) 25 MG capsule Take 1 capsule (25 mg total) by mouth nightly. Start after initial 7 days of nortriptyline 10mg .   ondansetron (ZOFRAN) 8 MG tablet 8 mg, Oral, Every 8 hours PRN   oxyCODONE (OXYCONTIN) 15 mg 12 hr crush resistant ER/CR tablet 15 mg, Oral, Nightly   oxyCODONE (OXYCONTIN) 30 mg TR12 12 hr crush resistant ER/CR tablet 30 mg, Oral, Every 12 hours   PONATinib (ICLUSIG) 30 mg tablet 30 mg, Oral, Daily (standard), Swallow tablets whole. Do not crush, break, cut or chew tablets.   predniSONE (DELTASONE) 10 MG tablet Take 4 tablets (40 mg total) by mouth daily for  5 days, THEN 3 tablets (30 mg total) daily for 7 days, THEN 2 tablets (20 mg total) daily for 7 days, THEN 1 tablet (10 mg total) daily for 7 days.   prochlorperazine (COMPAZINE) 10 MG tablet 10 mg, Oral, Every 6 hours PRN   sulfamethoxazole-trimethoprim (BACTRIM DS) 800-160 mg per tablet 1 tablet, Oral, 2 times a day on saturday, sunday, For prophylaxis while on chemo.   traMADoL (ULTRAM) 50 mg tablet 1-2 tabs po bid prn   valACYclovir (VALTREX) 500 MG tablet 500 mg, Oral, Daily (standard)       Medical History:  Past Medical History:   Diagnosis Date   ??? 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 BONE MARROW BIOPSY & ASPIRATION  01/21/2020        ??? IR INSERT PORT AGE GREATER THAN 5 YRS  03/10/2020    IR INSERT PORT AGE GREATER THAN 5 YRS 03/10/2020 [name], MD IMG VIR [facility]&V [facility]       Family History:   Family History   Problem Relation Age of Onset   ??? Melanoma Neg Hx    ??? Basal cell carcinoma Neg Hx    ??? Squamous cell carcinoma Neg Hx        Social History:    Social History     Tobacco Use   ??? Smoking status: Former Smoker     Packs/day: 2.00     Types: Cigarettes     Quit date: 01/20/2020     Years since quitting: 0.4   ??? Smokeless tobacco: Former Neurosurgeon     Types: Musician Use   ??? Vaping Use: Never used   Substance Use Topics   ??? Alcohol use: Not on file   ??? Drug use: Not on file        Review of Systems:  10 systems were reviewed and are negative unless otherwise mentioned in the HPI    Objective:   Physical Exam:  Temp:  [36.6 ??C-38.8 ??C] 36.6 ??C  Heart Rate:  [103-139] 139  Resp:  [20-38] 30  BP: (121-175)/(79-100) 121/100  SpO2:  [90 %-100 %] 94 %    Gen: Chronically ill, uncomfortable-appearing, NAD. Nontoxic appearing.  HEENT: atraumatic, sclera anicteric, tacky mucous membranees. OP w/o erythema or exudate   Heart: RRR, S1, S2, no M/R/G, no chest wall tenderness  Lungs: CTAB, no crackles or wheezes, no use of accessory muscles  Abdomen: Normoactive bowel sounds, soft, NTND, no rebound/guarding  Extremities: no clubbing, cyanosis, or edema  MSK: extremities, paraspinal muscles diffusely tender to palpation, neck supple  Skin: Skin is warm, dry and intact. No rash noted. Port right chest without evidence of erythema or drainage  Psych: Appropriate mood and affect    Labs/Studies/Imaging:  Labs, Studies, Imaging from the last 24hrs per EMR and personally reviewed

## 2020-07-18 NOTE — Unmapped (Signed)
[facility] Emergency Department Provider Note    ED Clinical Impression     Final diagnoses:   Sepsis, due to unspecified organism, unspecified whether acute organ dysfunction present (CMS-HCC) (Primary)       History     CHIEF COMPLAINT:   Chief Complaint   Patient presents with   ??? 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  Patient's wife took his fever T-max 103.6??F axillary.  Patient complains of chronic cough and shortness of breath.  He denies any chest pain, vomiting, abdominal pain, diarrhea, dysuria, hematuria.  He complains of chronic back pain which he takes Dilaudid 2 mg oral at home.  He denies any known sick contacts.  His wife denies any rash or redness around his central port in the right chest.     PAST MEDICAL HISTORY/PAST SURGICAL HISTORY:   Past Medical History:   Diagnosis Date   ??? 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 BONE MARROW BIOPSY & ASPIRATION  01/21/2020        ??? IR INSERT PORT AGE GREATER THAN 5 YRS  03/10/2020    IR INSERT PORT AGE GREATER THAN 5 YRS 03/10/2020 [name], MD IMG VIR [facility]       MEDICATIONS:     Current Facility-Administered Medications:   ???  acetaminophen (TYLENOL) tablet 1,000 mg, 1,000 mg, Oral, Once, Norva Riffle, MD    Current Outpatient Medications:   ???  acetaminophen (TYLENOL) 325 MG tablet, Take 650 mg by mouth every six (6) hours as needed for pain. , Disp: , Rfl:   ???  amLODIPine (NORVASC) 10 MG tablet, Take 1 tablet (10 mg total) by mouth daily., Disp: 30 tablet, Rfl: 0  ???  aspirin 81 MG chewable tablet, Chew 1 tablet (81 mg total) daily., Disp: 36 tablet, Rfl: 0  ???  carvediloL (COREG) 12.5 MG tablet, Take 1 tablet (12.5 mg total) by mouth Two (2) times a day., Disp: 60 tablet, Rfl: 3  ???  clonazePAM (KLONOPIN) 0.5 MG tablet, Take 1 tablet (0.5 mg total) by mouth two (2) times a day as needed for anxiety for up to 7 days. (Patient not taking: Reported on 07/06/2020), Disp: 14 tablet, Rfl: 0  ???  clonazePAM (KLONOPIN) 0.5 MG tablet, Take 1 tablet (0.5 mg total) by mouth every twelve (12) hours for 7 days. May take an additional 1 tablet (0.5 mg total) by mouth 2 times a day as needed for anxiety., Disp: 28 tablet, Rfl: 0  ???  gabapentin (NEURONTIN) 300 MG capsule, Take 1 capsule (300 mg total) by mouth Three (3) times a day., Disp: 90 capsule, Rfl: 0  ???  HYDROmorphone (DILAUDID) 2 MG tablet, Take 1 tablet (2 mg total) by mouth every four (4) hours as needed for pain,severe (7-10) for up to 14 days., Disp: 56 tablet, Rfl: 0  ???  hydrOXYzine (ATARAX) 25 MG tablet, Take 1 tablet (25 mg total) by mouth every six (6) hours as needed., Disp: 60 tablet, Rfl: 1  ???  nortriptyline (PAMELOR) 25 MG capsule, Take 1 capsule (25 mg total) by mouth nightly. Start after initial 7 days of nortriptyline 10mg ., Disp: 30 capsule, Rfl: 0  ???  ondansetron (ZOFRAN)  8 MG tablet, Take 1 tablet (8 mg total) by mouth every eight (8) hours as needed for nausea (or itching)., Disp: 60 tablet, Rfl: 2  ???  oxyCODONE (OXYCONTIN) 15 mg 12 hr crush resistant ER/CR tablet, Take 1 tablet (15 mg total) by mouth nightly., Disp: 15 tablet, Rfl: 0  ???  oxyCODONE (OXYCONTIN) 30 mg TR12 12 hr crush resistant ER/CR tablet, Take 1 tablet (30 mg total) by mouth every twelve (12) hours., Disp: 14 tablet, Rfl: 0  ???  PONATinib (ICLUSIG) 30 mg tablet, Take 1 tablet (30 mg total) by mouth daily. Swallow tablets whole. Do not crush, break, cut or chew tablets., Disp: 30 tablet, Rfl: 5  ???  predniSONE (DELTASONE) 10 MG tablet, Take 4 tablets (40 mg total) by mouth daily for 5 days, THEN 3 tablets (30 mg total) daily for 7 days, THEN 2 tablets (20 mg total) daily for 7 days, THEN 1 tablet (10 mg total) daily for 7 days., Disp: 62 tablet, Rfl: 0  ???  prochlorperazine (COMPAZINE) 10 MG tablet, Take 1 tablet (10 mg total) by mouth every six (6) hours as needed for nausea (if no relief from zofran (ondansetron))., Disp: 60 tablet, Rfl: 1  ???  sulfamethoxazole-trimethoprim (BACTRIM DS) 800-160 mg per tablet, Take 1 tablet (160 mg of trimethoprim total) by mouth 2 times a day on Saturday, Sunday. For prophylaxis while on chemo., Disp: 48 tablet, Rfl: 3  ???  traMADoL (ULTRAM) 50 mg tablet, 1-2 tabs po bid prn, Disp: , Rfl:   ???  valACYclovir (VALTREX) 500 MG tablet, Take 1 tablet (500 mg total) by mouth daily., Disp: 90 tablet, Rfl: 3    ALLERGIES:   Bupropion hcl, Dapsone, Onion, Vancomycin analogues, Bismuth subsalicylate, Ceftaroline fosamil, and Furosemide    SOCIAL HISTORY:   Social History     Tobacco Use   ??? Smoking status: Former Smoker     Packs/day: 2.00     Types: Cigarettes     Quit date: 01/20/2020     Years since quitting: 0.4   ??? Smokeless tobacco: Former Neurosurgeon     Types: Chew   Substance Use Topics   ??? Alcohol use: Not on file       FAMILY HISTORY:  Family History   Problem Relation Age of Onset   ??? Melanoma Neg Hx    ??? Basal cell carcinoma Neg Hx    ??? Squamous cell carcinoma Neg Hx           Review of Systems    A 10 point review of systems was performed and is negative other than positive elements noted in HPI   Constitutional: Negative for diaphoresis, weight loss.  Eyes: Negative for visual changes.  ENT: Negative for sore throat.  Cardiovascular: Negative for chest pain, palpitations.  Respiratory: Negative for shortness of breath, cough.  Gastrointestinal: Negative for abdominal pain, nausea, vomiting or diarrhea.   Genitourinary: Negative for dysuria, urinary frequency.  Musculoskeletal: Negative for swelling.  Skin: Negative for rash.  Neurological: Negative for  focal weakness or numbness.    Physical Exam     VITAL SIGNS:    BP 174/100  - Pulse 104  - Temp 36.7 ??C (98.1 ??F) (Oral)  - Resp (!) 36  - Wt (!) 109.5 kg (241 lb 6.5 oz)  - SpO2 97%  - BMI 29.09 kg/m??     Constitutional: Alert and oriented x4.  Chronically ill and uncomfortable appearing adult male and in no acute distress.  Patient  nontoxic-appearing.  Eyes: Conjunctivae are normal.  PERRL, EOMI.  ENT       Head: Normocephalic and atraumatic.       Nose: No congestion.       Mouth/Throat: Mucous membranes are tacky.       Neck: No stridor.  No meningismus neck is supple no tenderness midline or paraspinal musculature.  Hematological/Lymphatic/Immunological: No cervical lymphadenopathy.  Cardiovascular: Heart rate as documented above. Regular rhythm  Single S1, S2. 2+ and symmetric distal pulses are present in all extremities. Warm and well perfused. Brisk capillary refill.   Respiratory: Normal respiratory effort. Lungs clear to auscultation bilaterally.   Gastrointestinal: Soft and nontender. There is no CVA tenderness bilaterally.  Musculoskeletal: Normal active range of motion in all extremities.  Tenderness palpation in the paraspinal musculature thoracolumbar spine.  No midline step-offs or deformities.       Right lower leg: No tenderness. No edema.       Left lower leg: No tenderness. No edema.  Neurologic: Normal speech and language. No gross focal neurologic deficits are appreciated.  Skin: Skin is warm, dry and intact. No rash noted.  Right chest with central infusion port without surrounding erythema induration or tenderness.  Psychiatric: Mood and affect are normal. Speech and behavior are normal.    ED Course, Assessment and Plan     Initial Clinical Impression:    July 17, 2020 8:46 PM   Adam 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 On exam, pt is hemodynamically stable, oxygenating well on room air and is afebrile.   Physical exam revealed tenderness in musculature in the back, supple neck, normal neurologic exam, normal cardiopulmonary exam, patient appears mildly clinically dehydrated. Patient is nontoxic-appearing.  Neurologic exam unremarkable.  HEENT exam unremarkable.  Skin exam unremarkable no evidence of surrounding cellulitis on his central access port.    BP 174/100  - Pulse 104  - Temp 36.7 ??C (98.1 ??F) (Oral)  - Resp (!) 36  - Wt (!) 109.5 kg (241 lb 6.5 oz)  - SpO2 97%  - BMI 29.09 kg/m??     Medical decision making: Patient history no current clinical presentation concern for fever and immunocompromise patient.  He has a history of MRSA bacteremia requiring ICU admission with similar presentation.  Will work-up with sepsis bundle and give empiric antibiotics as recommended by his oncologist who sent patient to the ED.  Based on patient's allergies discussed with pharmacy and will treat with cefepime and daptomycin given allergy to vancomycin analogs.  Evaluate for metabolic derangement, electrolyte abnormality possible urinary source of infection.  Will treat with home dose of Dilaudid.    Will treat patient with:     Medications   acetaminophen (TYLENOL) tablet 1,000 mg (has no administration in time range)   lactated ringers bolus 2,631 mL (2,631 mL Intravenous New Bag 07/17/20 2123)   cefepime (MAXIPIME) 2 g in sodium chloride 0.9 % (NS) 100 mL IVPB-connector bag (0 g Intravenous Stopped 07/17/20 2242)   DAPTOmycin (CUBICIN) 650 mg in sodium chloride (NS) 0.9 % 50 mL IVPB (0 mg/kg ?? 109.5 kg Intravenous Stopped 07/17/20 2242)   HYDROmorphone (DILAUDID) tablet 2 mg (2 mg Oral Given 07/17/20 2048)   HYDROmorphone (DILAUDID) tablet 2 mg (2 mg Oral Given 07/17/20 2250)       Diagnostic orders as below.     Orders Placed This  Encounter   Procedures   ??? Blood Culture   ??? Blood Culture   ??? XR Chest Portable   ??? CBC w/ Differential   ??? Comprehensive Metabolic Panel   ??? Lactate Sepsis, Venous   ??? Urinalysis with Culture Reflex   ??? hsTroponin I (single, no delta)   ??? Magnesium Level   ??? Lactate Sepsis, Venous   ??? Misc nursing order (Sepsis Timer)   ??? Cardiac Monitor   ??? Oxygen sat continuous monitoring   ??? Nursing oxygen orders / instructions   ??? Notify Provider   ??? In and Out (I & O) cath   ??? Notify Provider   ??? Obtain medical records   ??? Vital signs   ??? Notify pharmacy immediately that the ED Adult Sepsis order set has been initiated.   ??? ECG 12 Lead   ??? Insert peripheral IV       ED Course:  ED Course as of 07/17/20 2258   Fri Jul 17, 2020   2053 Patient's chest x-ray unremarkable no acute airspace disease.   2235 Lactate elevated 2.1.   2240 CBC with leukocytosis, appears chronic.  No worsening anemia.   2258 FH patient out for admission for fever in immunocompromised patient.     Care patient was signed out to oncoming ED physician pending admission.    _____________________________________________________________________    The case was discussed with attending physician who is in agreement with the above assessment and plan    Additional Medical Decision Making     I have reviewed the vital signs and the nursing notes. Labs and radiology results that were available during my care of the patient were independently reviewed by me and considered in my medical decision making.   I independently visualized the EKG tracing if performed  I independently visualized the radiology images if performed  I reviewed the patient's prior medical records if available.  Additional history obtained from family if available    Radiology     XR Chest Portable   Final Result      No acute airspace disease.          Labs     Labs Reviewed   LACTATE SEPSIS, VENOUS - Abnormal; Notable for the following components:       Result Value    Lactate, Venous 2.1 (*)     All other components within normal limits   CBC W/ AUTO DIFF - Abnormal; Notable for the following components:    WBC 14.6 (*)     RBC 3.80 (*)     HGB 11.8 (*)     HCT 35.1 (*)     RDW 18.9 (*)     Absolute Neutrophils 10.8 (*)     Absolute Monocytes 1.3 (*)     Anisocytosis Slight (*)     All other components within normal limits   BLOOD CULTURE   BLOOD CULTURE   CBC W/ DIFFERENTIAL    Narrative:     The following orders were created for panel order CBC w/ Differential.  Procedure                               Abnormality         Status                     ---------                               -----------         ------  CBC w/ Differential[[id number]]         Abnormal            Final result                 Please view results for these tests on the individual orders.   COMPREHENSIVE METABOLIC PANEL   URINALYSIS WITH CULTURE REFLEX   HIGH SENSITIVITY TROPONIN I - SINGLE   MAGNESIUM   LACTATE SEPSIS, VENOUS 2         ECG   Sinus tachycardia. No ST or T wave changes to indicate ischemia.  Peaked T waves consistent with prior EKGs.  No PR, QT, or QRS interval prolongation.    Pertinent labs & imaging results that were available during my care of the patient were reviewed by me and considered in my medical decision making (see chart for details).    Please note- This chart has been created using [employer]. Chart creation errors have been sought, but may not always be located and such creation errors, especially pronoun confusion, do NOT reflect on the standard of medical care.    Alycia Rossetti, MD  EM PGY-1     Norva Riffle, MD  Resident  07/17/20 [phone]

## 2020-07-18 NOTE — Unmapped (Addendum)
Adam 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 Cytokine release syndrome  Patient febrile as of 4/15 with increased hoarseness, SOB, DOE, and increased sputum production who was found to be tachypneic and on CT chest have evidence of multifocal somewhat nodular consolidative opacities that were most concerning for a fungal pneumonia. Other sources of infection include urine which is unlikely in setting of normal UA and bloodstream. Alternative etiologies include CRS though this would be unusual as patient is D27 of treatment with blinatumomab. On admission, pt was started on daptomycin and cefepime but due to probable respiratory source of infection daptomycin was discontinued on 4/16. Then on 4/18, based on concern for fungal pneumonia on imaging pt was started on cresemba. Also on 4/18 patient underwent bronchoscopy to help determine cause of infection. Numerous infectious studies were sent during hospitalization from both expectorated sputum and BAL from bronchoscopy. Full respiratory pathogen panel was negative. Sputum studies were notable for MRSA though no evidence of AFB or fungi. As patient was symptomatically improving without MRSA coverage this was thought to be a contaminant and a MRSA screen was collected and sent. Preliminary BAL studies showed 1+ PMN but other studies had not yet resulted at time of discharge. Pt chose to leave AMA on 4/19 despite pending infectious studies. Infectious disease was consulted regarding antibiotic management throughout admission and recommend patient continue on linezolid and cresemba x 14 days.     Ph+ ALL   Followed by [name]. Diagnosed in 01/2020 now in CR without flow MRD after GRAAPH induction and currently cycle 1 of Blinatumomab + ponatinib. At time of admission, blinatumumab was held for 24 hours due to concern for cytokine release syndrome and was restarted after an infectious source was identified. Ponatinib and prophylactic valtrex, bactrim and aspirin were continued throughout admission. On final day of admission patient chose to discontinue blinatumumab 4 hours early in order to leave AMA.     DRESS syndrome:  Followed by Dr. Caryn Section with [facility]. S/p punch biopsy on 3/11. Onset proximate to dose adjustment of dasatinib, and administration of posaconazole and sotrovimab. At time of admission pt was on 10 mg of prednisone taper which was continued during hospitalization. Additionally PRN  hydroxyzine was prescribed from symptomatic management.       MDD and anxiety with remote suicide attempt (age 41)  On admission pt was continued on  nortriptyline 25mg   and Klonopin to 0.5 mg nightly with additional 0.5mg  PRN for breakthrough daytime anxiety. Pt had increased anxiety during hospitalization likely exacerbated by nicotine withdrawal. Pt declined any nicotine replacement during admission.     Chronic back pain: Follows with [facility] palliative care. Continued nightly MS Contin 15mg  nightly with PRN dilaudid 4 mg q4h for pain. Also continued miralax and senna for opioid induced constipation during hospitalization.     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 Avoid lasix/loop diuretics, other ototoxic medications    Hypertension:  Continued home Amlodipine and Coreg    CKD: Creatinine was at baseline during admission.

## 2020-07-18 NOTE — Unmapped (Addendum)
Mr. Lobello is a 40 yo with B-ALL who is on blinatumumab. Wife called in tonight due to concerns of fever (103.6), headache, body aches, and sore throat. Pt is taking ppx bactrim. Last counts look ok. Wife is a [employer] fundraiser and took pt's BP and it was 130s/90s. She gave him tylenol just now. She said his port has no signs of infection. No cough but chronically SOB with exertion. No AMS. Given his degree of fever, I instructed her to get him to ED immediately for evaluation. I discussed my concern for infection vs cytokine release syndrome.    Plan:  -on arrival to ED would do full infectious workup and consider starting empiric abx  -Suspect that patient will need admission.   -If cytokine release syndrome is culprit for fever and body aches, prompt admission of steroids will be needed.    Rosine Door  Heme-Onc Fellow

## 2020-07-18 NOTE — Unmapped (Signed)
Bed: 08-P  Expected date:   Expected time:   Means of arrival:   Comments:

## 2020-07-18 NOTE — Unmapped (Signed)
Care Management  Initial Transition Planning Assessment              General  Care Manager assessed the patient by : In person interview with patient, Discussion with Clinical Care team, Medical record review  Orientation Level: Oriented X4  Functional level prior to admission: Independent  Reason for referral: Discharge Planning    CM met with patient in pt room.  Pt/visitors were not wearing hospital provided masks for the duration of the interaction with CM.   CM was wearing hospital provided surgical mask and hospital provided eye protection.  CM was not within 6 foot of the patient/visitors during this interaction.     Contact/Decision Maker  Extended Emergency Contact Information  Primary Emergency Contact: Gratz, tonya  Mobile Phone: [phone]  Relation: Spouse    Legal Next of Kin / Guardian / POA / Advance Directives     HCDM (patient stated preference): Eastep, [name] Spouse - [phone]    Advance Directive (Medical Treatment)  Does patient have an advance directive covering medical treatment?: Patient does not have advance directive covering medical treatment.  Reason patient does not have an advance directive covering medical treatment:: Patient does not wish to complete one at this time.    Health Care Decision Maker [HCDM] (Medical & Mental Health Treatment)  Healthcare Decision Maker: HCDM documented in the HCDM/Contact Info section.  Information offered on HCDM, Medical & Mental Health advance directives:: Patient declined information.    Advance Directive (Mental Health Treatment)  Does patient have an advance directive covering mental health treatment?: Patient does not have advance directive covering mental health treatment.  Reason patient does not have an advance directive covering mental health treatment:: Patient does not wish to complete one at this time.    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  Past Surgical History:   has a past surgical history that includes Bone Marrow Biopsy & Aspiration (01/21/2020) and IR Insert Port Age Greater Than 5 Years (03/10/2020).   Previous admit date: 06/19/2020    Primary Insurance- Payor: MEDICAID [place] / Plan: MEDICAID [place] / Product Type: *No Product type* /   Secondary Insurance ??? None  Prescription Coverage ??? Yes  Preferred Pharmacy - [facility]  [id number] [place], [place] - [address]  [facility]    Transportation home: Private vehicle       Support Systems/Concerns: Spouse, Children, Family Members    Responsibilities/Dependents at home?: Yes (Describe)    Home Care services in place prior to admission?: No                  Equipment Currently Used at Home: oxygen, walker, rolling, commode chair, shower chair       Currently receiving outpatient dialysis?: No       Financial Information       Need  for financial assistance?: No       Social Determinants of Health  Social Determinants of Health     Tobacco Use: Medium Risk   ??? Smoking Tobacco Use: Former Smoker   ??? Smokeless Tobacco Use: Former Neurosurgeon   Alcohol Use: Not on Programmer, applications Strain: Low Risk    ??? Difficulty of Paying Living Expenses: Not very hard   Food Insecurity: No Food Insecurity   ??? Worried About Running Out of Food in the Last Year: Never true   ??? Ran Out of Food in the Last Year: Never true   Transportation Needs: No Transportation Needs   ??? Lack of Transportation (Medical): No   ??? Lack of Transportation (Non-Medical): No   Physical Activity: Not on file   Stress: Not on file   Social Connections: Not on file   Intimate Partner Violence: Not on file   Depression: Not on file   Housing/Utilities: Low Risk    ??? Within the past 12 months, have you ever stayed: outside, in a car, in a tent, in an overnight shelter, or temporarily in someone else's home (i.e. couch-surfing)?: No   ??? Are you worried about losing your housing?: No   ??? Within the past 12 months, have you been unable to get utilities (heat, electricity) when it was really needed?: No   Substance Use: Not on file   Health Literacy: Not on file       Discharge Needs Assessment  Concerns to be Addressed: no discharge needs identified    Clinical Risk Factors: Principal Diagnosis: Cancer, Stroke, COPD, Heart Failure, AMI, Pneumonia, Joint Replacment, New Diagnosis    Barriers to taking medications: No    Prior overnight hospital stay or ED visit in last 90 days: Yes    Readmission Within the Last 30 Days: current reason for admission unrelated to previous admission         Anticipated Changes Related to Illness: none    Equipment Needed After Discharge: none    Discharge Facility/Level of Care Needs: other (see comments) (home)    Readmission  Risk of Unplanned Readmission Score: UNPLANNED READMISSION SCORE: 33%  Predictive Model Details          33% (High)  Factor Value    Calculated 07/18/2020 12:03 17% Number of active Rx orders 34    [place] Risk of Unplanned Readmission Model 12% Number of ED visits in last six months 3     10% Number of hospitalizations in last year 3     7% Diagnosis of cancer present     7% Active antipsychotic Rx order present     7% ECG/EKG order present in last 6 months     5% Encounter of ten days or longer in last year present     5% Diagnosis of electrolyte disorder present     5% Restraint order present in last 6 months     5% Imaging order present in last 6 months     4% Latest hemoglobin low (12.1 g/dL)     4% Phosphorous result present     3% Active corticosteroid Rx order present     3% Latest creatinine high (2.10 mg/dL)     2% Age 40     2% Charlson Comorbidity Index 2     1% Future appointment scheduled     0% Current length of stay 0.404 days      Readmitted Within the Last 30 Days? (No if  blank) Yes  Patient at risk for readmission?: Yes    Discharge Plan  Screen findings are: Care Manager reviewed the plan of the patient's care with the Multidisciplinary Team. No discharge planning needs identified at this time. Care Manager will continue to manage plan and monitor patient's progress with the team.    Expected Discharge Date:           Quality data for continuing care services shared with patient and/or representative?: N/A  Patient and/or family were provided with choice of facilities / services that are available and appropriate to meet post hospital care needs?: N/A       Initial Assessment complete?: Yes

## 2020-07-19 LAB — CBC W/ AUTO DIFF
BASOPHILS ABSOLUTE COUNT: 0 10*9/L (ref 0.0–0.1)
BASOPHILS RELATIVE PERCENT: 0.2 %
EOSINOPHILS ABSOLUTE COUNT: 0.5 10*9/L (ref 0.0–0.5)
EOSINOPHILS RELATIVE PERCENT: 3.7 %
HEMATOCRIT: 30 % — ABNORMAL LOW (ref 39.0–48.0)
HEMOGLOBIN: 10.6 g/dL — ABNORMAL LOW (ref 12.9–16.5)
LYMPHOCYTES ABSOLUTE COUNT: 1.3 10*9/L (ref 1.1–3.6)
LYMPHOCYTES RELATIVE PERCENT: 9.2 %
MEAN CORPUSCULAR HEMOGLOBIN CONC: 35.4 g/dL (ref 32.0–36.0)
MEAN CORPUSCULAR HEMOGLOBIN: 32.2 pg (ref 25.9–32.4)
MEAN CORPUSCULAR VOLUME: 91 fL (ref 77.6–95.7)
MEAN PLATELET VOLUME: 7.1 fL (ref 6.8–10.7)
MONOCYTES ABSOLUTE COUNT: 0.6 10*9/L (ref 0.3–0.8)
MONOCYTES RELATIVE PERCENT: 4.5 %
NEUTROPHILS ABSOLUTE COUNT: 11.3 10*9/L — ABNORMAL HIGH (ref 1.8–7.8)
NEUTROPHILS RELATIVE PERCENT: 82.4 %
PLATELET COUNT: 165 10*9/L (ref 150–450)
RED BLOOD CELL COUNT: 3.3 10*12/L — ABNORMAL LOW (ref 4.26–5.60)
RED CELL DISTRIBUTION WIDTH: 18.7 % — ABNORMAL HIGH (ref 12.2–15.2)
WBC ADJUSTED: 13.7 10*9/L — ABNORMAL HIGH (ref 3.6–11.2)

## 2020-07-19 LAB — BASIC METABOLIC PANEL
ANION GAP: 7 mmol/L (ref 5–14)
BLOOD UREA NITROGEN: 21 mg/dL (ref 9–23)
BUN / CREAT RATIO: 11
CALCIUM: 9.1 mg/dL (ref 8.7–10.4)
CHLORIDE: 104 mmol/L (ref 98–107)
CO2: 26 mmol/L (ref 20.0–31.0)
CREATININE: 1.86 mg/dL — ABNORMAL HIGH
EGFR CKD-EPI AA MALE: 51 mL/min/{1.73_m2} — ABNORMAL LOW (ref >=60)
EGFR CKD-EPI NON-AA MALE: 44 mL/min/{1.73_m2} — ABNORMAL LOW (ref >=60)
GLUCOSE RANDOM: 104 mg/dL (ref 70–179)
POTASSIUM: 3.9 mmol/L (ref 3.4–4.8)
SODIUM: 137 mmol/L (ref 135–145)

## 2020-07-19 LAB — MAGNESIUM: MAGNESIUM: 1.9 mg/dL (ref 1.6–2.6)

## 2020-07-19 LAB — LEGIONELLA ANTIGEN, URINE: LEGIONELLA, URINARY ANTIGEN: NEGATIVE

## 2020-07-19 LAB — C-REACTIVE PROTEIN: C-REACTIVE PROTEIN: 292 mg/L — ABNORMAL HIGH (ref <=10.0)

## 2020-07-19 LAB — CRYPTOCOCCAL ANTIGEN, SERUM: CRYPTOCOCCAL ANTIGEN: NEGATIVE

## 2020-07-19 MED ADMIN — aspirin chewable tablet 81 mg: 81 mg | ORAL | @ 12:00:00

## 2020-07-19 MED ADMIN — cefepime (MAXIPIME) 2 g in sodium chloride 0.9 % (NS) 100 mL IVPB-connector bag: 2 g | INTRAVENOUS | @ 19:00:00 | Stop: 2020-07-23

## 2020-07-19 MED ADMIN — gabapentin (NEURONTIN) capsule 300 mg: 300 mg | ORAL | @ 01:00:00

## 2020-07-19 MED ADMIN — carvediloL (COREG) tablet 12.5 mg: 12.5 mg | ORAL

## 2020-07-19 MED ADMIN — HYDROmorphone (DILAUDID) tablet 4 mg: 4 mg | ORAL | @ 02:00:00 | Stop: 2020-07-25

## 2020-07-19 MED ADMIN — PONATinib (ICLUSIG) tablet 30 mg **PATIENT SUPPLIED**: 30 mg | ORAL | @ 19:00:00

## 2020-07-19 MED ADMIN — oxyCODONE (OxyCONTIN) 12 hr crush resistant ER/CR tablet 10 mg: 10 mg | ORAL | @ 02:00:00 | Stop: 2020-08-01

## 2020-07-19 MED ADMIN — sulfamethoxazole-trimethoprim (BACTRIM DS) 800-160 mg tablet 160 mg of trimethoprim: 1 | ORAL | Stop: 2020-08-01

## 2020-07-19 MED ADMIN — nortriptyline (PAMELOR) capsule 25 mg: 25 mg | ORAL | @ 01:00:00

## 2020-07-19 MED ADMIN — predniSONE (DELTASONE) tablet 10 mg: 10 mg | ORAL | @ 12:00:00

## 2020-07-19 MED ADMIN — sulfamethoxazole-trimethoprim (BACTRIM DS) 800-160 mg tablet 160 mg of trimethoprim: 1 | ORAL | @ 12:00:00 | Stop: 2020-08-01

## 2020-07-19 MED ADMIN — amLODIPine (NORVASC) tablet 10 mg: 10 mg | ORAL | @ 12:00:00

## 2020-07-19 MED ADMIN — cefepime (MAXIPIME) 2 g in sodium chloride 0.9 % (NS) 100 mL IVPB-connector bag: 2 g | INTRAVENOUS | @ 06:00:00 | Stop: 2020-07-23

## 2020-07-19 MED ADMIN — oxyCODONE (OxyCONTIN) 12 hr crush resistant ER/CR tablet 10 mg: 10 mg | ORAL | @ 12:00:00 | Stop: 2020-08-01

## 2020-07-19 MED ADMIN — blinatumomab (BLINCYTO) 28 mcg/day in sodium chloride 0.9% NON-PVC 0.9 % IVPB (24-HR INFUSION): 28 ug/d | INTRAVENOUS | @ 17:00:00 | Stop: 2020-07-21

## 2020-07-19 MED ADMIN — clonazePAM (KlonoPIN) tablet 0.5 mg: .5 mg | ORAL | @ 01:00:00

## 2020-07-19 MED ADMIN — HYDROmorphone (DILAUDID) tablet 4 mg: 4 mg | ORAL | @ 11:00:00 | Stop: 2020-07-25

## 2020-07-19 MED ADMIN — HYDROmorphone (DILAUDID) tablet 4 mg: 4 mg | ORAL | @ 18:00:00 | Stop: 2020-07-25

## 2020-07-19 MED ADMIN — gabapentin (NEURONTIN) capsule 300 mg: 300 mg | ORAL | @ 12:00:00

## 2020-07-19 MED ADMIN — carvediloL (COREG) tablet 12.5 mg: 12.5 mg | ORAL | @ 12:00:00

## 2020-07-19 MED ADMIN — hydrOXYzine (ATARAX) tablet 10 mg: 10 mg | ORAL | @ 19:00:00

## 2020-07-19 MED ADMIN — HYDROmorphone (DILAUDID) tablet 4 mg: 4 mg | ORAL | @ 14:00:00 | Stop: 2020-07-25

## 2020-07-19 MED ADMIN — valACYclovir (VALTREX) tablet 500 mg: 500 mg | ORAL | @ 12:00:00

## 2020-07-19 MED ADMIN — HYDROmorphone (DILAUDID) tablet 4 mg: 4 mg | ORAL | @ 22:00:00 | Stop: 2020-07-25

## 2020-07-19 MED ADMIN — clonazePAM (KlonoPIN) tablet 0.5 mg: .5 mg | ORAL | @ 17:00:00

## 2020-07-19 MED ADMIN — dexamethasone (DECADRON) 4 mg/mL injection 16 mg: 16 mg | INTRAVENOUS | @ 17:00:00 | Stop: 2020-07-19

## 2020-07-19 MED ADMIN — gabapentin (NEURONTIN) capsule 300 mg: 300 mg | ORAL | @ 18:00:00

## 2020-07-19 MED ADMIN — HYDROmorphone (DILAUDID) tablet 4 mg: 4 mg | ORAL | @ 06:00:00 | Stop: 2020-07-25

## 2020-07-19 NOTE — Unmapped (Signed)
Pt is resting quietly overnight, Denies N/V, Afebrile, VSS,  No falls/events noted. Call bell in reach.

## 2020-07-19 NOTE — Unmapped (Signed)
""  VENOUS ACCESS TEAM PROCEDURE    Order was placed for a \""PIV by Venous Access Team (VAT)\"".  Patient was assessed at bedside for placement of a PIV. PPE were donned per protocol.  Access was obtained. Blood return noted.  Dressing intact and device well secured.  Flushed with normal saline.  See LDA for details.  Pt advised to inform RN of any s/s of discomfort at the PIV site.    Workup / Procedure Time:  30 minutes       Care RN was notified.       Thank you,     Kynleigh Artz RN Venous Access Team""

## 2020-07-19 NOTE — Unmapped (Signed)
Hematology Resident (MEDE) Progress Note    Assessment & Plan:   Adam 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 *      Fever c/f Sepsis from likely pulmonary source vs. Cytokine release syndrome  Patient febrile as of 4/15 with increased hoarseness, SOB, DOE, and increased sputum production who was found to be tachypneic and on CT chest had findings concerning for possible fungal pathogen. Other sources of infection include urine which is unlikely in setting of normal UA and bloodstream. Alternative etiologies include CRS though this would be unusual as patient is D27 of treatment with blinatumomab.   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 Followed by [name].  -C1D27 (4/16) Blinatumumab + Ponatinib  -Continue ponatinib  -Restarting today Blinatumomab  -Continue prophylactic daily valtrex and Bactrim (2-3x/week) and ASA    DRESS??syndrome: ??Followed by Dr. Caryn Section with [facility]. S/p punch biopsy on 3/11. Onset proximate to dose adjustment of dasatinib, and administration of posaconazole and sotrovimab. Currently on prednisone taper: 40mg  daily??x 7??(3/25-3/31); 30mg  dalily x 7 (4/1- 4/7); 20mg  daily x 7 (4/8-4/14); 10 mg daily x 7 (4/15-4/21).  -Prednisone 10 mg daily  -PRN hydroxyzine??    Chronic Problems:  MDD and anxiety with remote suicide attempt (age 31)  - continue nortriptyline 25mg    -Continue Klonopin to 0.5 mg nightly with additional 0.5mg  PRN for breakthrough daytime anxiety    Chronic back pain: Follows with [facility] palliative care.  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 Avoid lasix/loop diuretics, other ototoxic medications  Hypertension:  Continue Amlodipine and Coreg  CKD: Creatinine at baseline    Daily Checklist:  Diet: Regular Diet  DVT PPx: Contraindicated 2/2 Held in setting of upcoming procedure  Electrolytes: No Repletion Needed  Code Status: Full Code  Dispo: continue floor care    Team Contact Information:   Primary Team: Hematology Resident (MEDE)  Primary Resident: Louie Bun, MD  Resident's Pager: [phone] (Hematology Intern - White)    Interval History:   No acute events overnight.  Requesting to go home when possible, feels better  All other systems were reviewed and are negative except as noted in the HPI    Objective:   Temp:  [35.7 ??C-37.2 ??C] 36.2 ??C  Heart Rate:  [87-115] 110  Resp:  [16-20] 16  BP: (112-150)/(58-77) 114/77  SpO2:  [91 %-99 %] 95 %    Gen: WDWN in NAD, answers questions appropriately  Eyes: sclera anicteric, EOMI  HENT:  atraumatic, MMM, OP w/o erythema or exudate   Heart: RRR, S1, S2, no M/R/G, no chest wall tenderness  Lungs: CTAB, no crackles or wheezes, no use of accessory muscles  Abdomen: Normoactive bowel sounds, soft, NTND, no rebound/guarding  Extremities: no clubbing, cyanosis, or edema in the BLEs  Psych: Alert, oriented, appropriate mood and affect    Labs/Studies: Labs and Studies from the last 24hrs per EMR and Reviewed

## 2020-07-19 NOTE — Unmapped (Addendum)
Patient completed care today without difficulty. C1 D6 IP/OP LEUKEMIA BLINATUMOMAB 7-DAY INFUSION, blinatumomab restarted today with inpatient 24 hour infusion, treatment parameters met. No signs/symptoms of intolerance. Pre-treatment sentence writing completed. Noted increased itching and rash on reddened skin areas, generalized. Evaluated by team. Pain managed to satisfaction with current regimen of scheduled medications. Anxiety managed to satisfaction with scheduled and prn medications. No further complaints, no further changes to plan of care.    Problem: Adult Inpatient Plan of Care  Goal: Plan of Care Review  Outcome: Progressing  Goal: Patient-Specific Goal (Individualized)  Outcome: Progressing  Goal: Absence of Hospital-Acquired Illness or Injury  Outcome: Progressing  Intervention: Identify and Manage Fall Risk  Recent Flowsheet Documentation  Taken 07/19/2020 0715 by Regina Eck, RN  Safety Interventions:  ??? chemotherapeutic agent precautions  ??? lighting adjusted for tasks/safety  ??? low bed  ??? nonskid shoes/slippers when out of bed  Goal: Optimal Comfort and Wellbeing  Outcome: Progressing  Goal: Readiness for Transition of Care  Outcome: Progressing  Goal: Rounds/Family Conference  Outcome: Progressing     Problem: Behavioral Health Comorbidity  Goal: Maintenance of Behavioral Health Symptom Control  Outcome: Progressing     Problem: Hypertension Comorbidity  Goal: Blood Pressure in Desired Range  Outcome: Progressing     Problem: Pain Chronic (Persistent) (Comorbidity Management)  Goal: Acceptable Pain Control and Functional Ability  Outcome: Progressing     Problem: Impaired Wound Healing  Goal: Optimal Wound Healing  Outcome: Progressing

## 2020-07-19 NOTE — Unmapped (Signed)
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 Patient with hx of candidemia and MRSA 10-02/2020 s/p treatment. Given his lack of localizing symptoms for a source of infection and his new nodular consolidations on imaging, will plan for bronchoscopy. While a bacterial PNA is possible, appearance is specifically concerning for fungal infection. Of note, pt is not neutropenic. He overall appears clinically well and feels much improved after receiving abx.     - Plan for bronchoscopy 07/20/20    - NPO at midnight    - Hold [facility] unless strong clinical indication    - Plts > 50 and Hgb >7    Thank you for allowing [place] to participate in this patient's care. Please do not hesitate to call the on-call pulmonary fellow at [phone] with any questions.    This patient was seen and discussed with attending physician, Dr. Lurena Nida who agrees with the assessment and plan above.    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 He was in his usual take of health until 4/15 when he felt dizzy upon standing. He then took a nap and woke up with fever and chills. Temp at home 103. He was advised to come to the hospital. He denies N/V, diarrhea, chest pain, cough that is different from his baseline cough, shortness of breath, sputum production, skin rashes, dysuria. Overall he feels much improved since starting abx.     Past Medical History:  Past Medical History:   Diagnosis Date   ??? 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 BONE MARROW BIOPSY & ASPIRATION  01/21/2020        ??? IR INSERT PORT AGE GREATER THAN 5 YRS  03/10/2020    IR INSERT PORT AGE GREATER THAN 5 YRS 03/10/2020 [name], MD IMG VIR H&V [facility]       Other History:  Family History   Problem Relation Age of Onset   ??? Melanoma Neg Hx    ??? Basal cell carcinoma Neg Hx    ??? Squamous cell carcinoma Neg Hx      Social History     Socioeconomic History   ??? Marital status: Married     Spouse name: Not on file   ??? Number of children: Not on file   ??? Years of education: Not on file   ??? Highest education level: Not on file   Occupational History   ??? Not on file   Tobacco Use   ??? Smoking status: Former Smoker     Packs/day: 2.00     Types: Cigarettes     Quit date: 01/20/2020     Years since quitting: 0.4   ??? Smokeless tobacco: Former Neurosurgeon     Types: Musician Use   ??? Vaping Use: Never used   Substance and Sexual Activity   ??? Alcohol  use: Not on file   ??? Drug use: Not on file   ??? Sexual activity: Not on file   Other Topics Concern   ??? Do you use sunscreen? No   ??? Tanning bed use? No   ??? Are you easily burned? Yes   ??? Excessive sun exposure? No   ??? Blistering sunburns? Yes   Social History Narrative   ??? Not on file     Social Determinants of Health     Financial Resource Strain: Low Risk    ??? Difficulty of Paying Living Expenses: Not very hard   Food Insecurity: No Food Insecurity   ??? Worried About Running Out of Food in the Last Year: Never true   ??? Ran Out of Food in the Last Year: Never true   Transportation Needs: No Transportation Needs   ??? Lack of Transportation (Medical): No   ??? Lack of Transportation (Non-Medical): No   Physical Activity: Not on file   Stress: Not on file   Social Connections: Not on file       Home Medications:  No current facility-administered medications on file prior to encounter.     Current Outpatient Medications on File Prior to Encounter   Medication Sig Dispense Refill   ??? acetaminophen (TYLENOL) 325 MG tablet Take 650 mg by mouth every six (6) hours as needed for pain.      ??? amLODIPine (NORVASC) 10 MG tablet Take 1 tablet (10 mg total) by mouth daily. 30 tablet 0   ??? aspirin 81 MG chewable tablet Chew 1 tablet (81 mg total) daily. 36 tablet 0   ??? carvediloL (COREG) 12.5 MG tablet Take 1 tablet (12.5 mg total) by mouth Two (2) times a day. 60 tablet 3   ??? gabapentin (NEURONTIN) 300 MG capsule Take 1 capsule (300 mg total) by mouth Three (3) times a day. 90 capsule 0   ??? HYDROmorphone (DILAUDID) 2 MG tablet Take 1 tablet (2 mg total) by mouth every four (4) hours as needed for pain,severe (7-10) for up to 14 days. 56 tablet 0   ??? nortriptyline (PAMELOR) 25 MG capsule Take 1 capsule (25 mg total) by mouth nightly. Start after initial 7 days of nortriptyline 10mg . 30 capsule 0   ??? ondansetron (ZOFRAN) 8 MG tablet Take 1 tablet (8 mg total) by mouth every eight (8) hours as needed for nausea (or itching). 60 tablet 2   ??? oxyCODONE (OXYCONTIN) 15 mg 12 hr crush resistant ER/CR tablet Take 1 tablet (15 mg total) by mouth nightly. 15 tablet 0   ??? oxyCODONE (OXYCONTIN) 30 mg TR12 12 hr crush resistant ER/CR tablet Take 1 tablet (30 mg total) by mouth every twelve (12) hours. (Patient taking differently: Take 15 mg by mouth every twelve (12) hours. ) 14 tablet 0   ??? PONATinib (ICLUSIG) 30 mg tablet Take 1 tablet (30 mg total) by mouth daily. Swallow tablets whole. Do not crush, break, cut or chew tablets. 30 tablet 5   ??? predniSONE (DELTASONE) 10 MG tablet Take 4 tablets (40 mg total) by mouth daily for 5 days, THEN 3 tablets (30 mg total) daily for 7 days, THEN 2 tablets (20 mg total) daily for 7 days, THEN 1 tablet (10 mg total) daily for 7 days. 62 tablet 0   ??? prochlorperazine (COMPAZINE) 10 MG tablet Take 1 tablet (10 mg total) by mouth every six (6) hours as needed for nausea (if no relief from zofran (ondansetron)). 60 tablet 1   ???  sulfamethoxazole-trimethoprim (BACTRIM DS) 800-160 mg per tablet Take 1 tablet (160 mg of trimethoprim total) by mouth 2 times a day on Saturday, Sunday. For prophylaxis while on chemo. 48 tablet 3   ??? valACYclovir (VALTREX) 500 MG tablet Take 1 tablet (500 mg total) by mouth daily. 90 tablet 3   ??? clonazePAM (KLONOPIN) 0.5 MG tablet Take 1 tablet (0.5 mg total) by mouth two (2) times a day as needed for anxiety for up to 7 days. (Patient not taking: Reported on 07/06/2020) 14 tablet 0   ??? clonazePAM (KLONOPIN) 0.5 MG tablet Take 1 tablet (0.5 mg total) by mouth every twelve (12) hours for 7 days. May take an additional 1 tablet (0.5 mg total) by mouth 2 times a day as needed for anxiety. 28 tablet 0   ??? hydrOXYzine (ATARAX) 25 MG tablet Take 1 tablet (25 mg total) by mouth every six (6) hours as needed. (Patient not taking: Reported on 07/18/2020) 60 tablet 1   ??? traMADoL (ULTRAM) 50 mg tablet 1-2 tabs po bid prn (Patient not taking: Reported on 07/18/2020)         In-Hospital Medications:  Scheduled:  ??? amLODIPine  10 mg Oral Daily   ??? aspirin  81 mg Oral Daily   ??? blinatumomab (BLINCYTO) 15 mcg/m2/day (max 28 mcg/day) 24-hr infusion  28 mcg/day Intravenous over 24 hr   ??? carvediloL  12.5 mg Oral BID   ??? Cefepime  2 g Intravenous Q12H   ??? clonazePAM  0.5 mg Oral Nightly   ??? dexamethasone  16 mg Intravenous Once   ??? gabapentin  300 mg Oral TID   ??? HYDROmorphone  4 mg Oral Q4H   ??? nortriptyline  25 mg Oral Nightly   ??? oxyCODONE  10 mg Oral Q12H SCH   ??? polyethylene glycol  17 g Oral Daily   ??? PONATinib  30 mg Oral Q24H   ??? predniSONE  10 mg Oral Daily   ??? sulfamethoxazole-trimethoprim  1 tablet Oral BID   ??? [START ON 07/20/2020] sulfamethoxazole-trimethoprim  1 tablet Oral Once   ??? valACYclovir  500 mg Oral Daily     Continuous Infusions:  ??? Chemo Clarification Order     ??? IP okay to treat     ??? IP okay to treat     ??? sodium chloride       PRN Medications:  aluminum-magnesium hydroxide-simethicone, Chemo Clarification Order, clonazePAM, diphenhydrAMINE, EPINEPHrine IM, famotidine (PEPCID) IV, guaiFENesin, IP okay to treat, IP okay to treat, melatonin, meperidine, methylPREDNISolone sodium succinate (PF), ondansetron **OR** ondansetron, ondansetron, oxyCODONE, prochlorperazine, prochlorperazine, senna, sodium chloride, sodium chloride 0.9%    Allergies:  Allergies as of 07/17/2020 - Reviewed 07/17/2020   Allergen Reaction Noted   ??? Bupropion hcl Other (See Comments) 12/23/2019   ??? Dapsone Other (See Comments) and Anaphylaxis 02/29/2020   ??? Onion Anaphylaxis 01/26/2020   ??? Vancomycin analogues  03/16/2020   ??? Bismuth subsalicylate Nausea And Vomiting 08/07/2013   ??? Ceftaroline fosamil Rash and Other (See Comments) 03/03/2020   ??? Furosemide  04/13/2020         PHYSICAL EXAM:   BP 126/64  - Pulse 107  - Temp 36.1 ??C (Oral)  - Resp 16  - Wt (!) 106.7 kg (235 lb 3.2 oz)  - SpO2 94%  - BMI 28.35 kg/m??     General: No acute distress  HEENT: Normal conjunctiva, moist mucus membranes   Cardiac: RRR, no murmur   Pulmonary: CTAB, no wheezing or  crackles, symmetric air movement   Abdomen: Non-distended   Vascular: Warm extremities, no edema   Skin: Port site without surrounding erythema or edema   Neuro: No obvious focal deficits, stable gait   Psych: Cooperative, appropriate mood and affect     LABORATORY and RADIOLOGY DATA:     Pertinent Laboratory Data:  Lab Results   Component Value Date    WBC 13.7 (H) 07/19/2020    HGB 10.6 (L) 07/19/2020    HCT 30.0 (L) 07/19/2020    PLT 165 07/19/2020       Lab Results   Component Value Date    NA 137 07/19/2020    K 3.9 07/19/2020    CL 104 07/19/2020    CO2 26.0 07/19/2020    BUN 21 07/19/2020    CREATININE 1.86 (H) 07/19/2020    GLU 104 07/19/2020    CALCIUM 9.1 07/19/2020    MG 1.9 07/19/2020    PHOS 3.9 07/18/2020       Lab Results   Component Value Date    BILITOT 0.5 07/17/2020    BILIDIR 0.10 06/27/2020    PROT 6.8 07/17/2020    ALBUMIN 4.2 07/17/2020    ALT 15 07/17/2020    AST 13 07/17/2020    ALKPHOS 69 07/17/2020       Lab Results   Component Value Date    INR 0.97 04/15/2020    APTT 38.0 (H) 04/15/2020       Pertinent Micro Data:  Microbiology Results (last day)     Procedure Component Value Date/Time Date/Time    Blood Culture [[id number]]  (Normal) Collected: 07/17/20 2140    Lab Status: Preliminary result Specimen: Blood from 1 Peripheral Draw Updated: 07/18/20 2245     Blood Culture, Routine No Growth at 24 hours    Blood Culture [[id number]]  (Normal) Collected: 07/17/20 2141    Lab Status: Preliminary result Specimen: Blood from 1 Peripheral Draw Updated: 07/18/20 2245     Blood Culture, Routine No Growth at 24 hours    Fungal Culture [[id number]]     Lab Status: No result Specimen: SPUTUM EXPECTORATED     AFB culture [[id number]]     Lab Status: No result Specimen: SPUTUM EXPECTORATED     Lower Respiratory Culture [[id number]]     Lab Status: No result Specimen: SPUTUM EXPECTORATED            Pertinent Imaging Data:  CT chest   IMPRESSION:  ??  1. Multifocal, somewhat nodular, consolidative opacities in both lungs are new relative to the comparison exam from April 28, 2020, and they are most concerning for infectious pneumonia. Given the nodular configuration of many of the findings, fungal pneumonia is my primary concern.  ??  2. Similar to CT imaging of the chest dating back to March 02, 2020, there is hyperattenuation within the wall of the left cardiac ventricle which likely reflects calcification related to myocardial infarction, myocarditis, or metastatic deposition of calcium (abnormal calcium homeostasis). Notably, this finding is new relative to imaging from October 2021

## 2020-07-20 LAB — BASIC METABOLIC PANEL
ANION GAP: 9 mmol/L (ref 5–14)
BLOOD UREA NITROGEN: 26 mg/dL — ABNORMAL HIGH (ref 9–23)
BUN / CREAT RATIO: 12
CALCIUM: 9.6 mg/dL (ref 8.7–10.4)
CHLORIDE: 104 mmol/L (ref 98–107)
CO2: 24 mmol/L (ref 20.0–31.0)
CREATININE: 2.15 mg/dL — ABNORMAL HIGH
EGFR CKD-EPI AA MALE: 43 mL/min/{1.73_m2} — ABNORMAL LOW (ref >=60)
EGFR CKD-EPI NON-AA MALE: 37 mL/min/{1.73_m2} — ABNORMAL LOW (ref >=60)
GLUCOSE RANDOM: 114 mg/dL (ref 70–179)
POTASSIUM: 4.6 mmol/L (ref 3.4–4.8)
SODIUM: 137 mmol/L (ref 135–145)

## 2020-07-20 LAB — CBC W/ AUTO DIFF
BASOPHILS ABSOLUTE COUNT: 0 10*9/L (ref 0.0–0.1)
BASOPHILS RELATIVE PERCENT: 0.2 %
EOSINOPHILS ABSOLUTE COUNT: 0 10*9/L (ref 0.0–0.5)
EOSINOPHILS RELATIVE PERCENT: 0.3 %
HEMATOCRIT: 30.9 % — ABNORMAL LOW (ref 39.0–48.0)
HEMOGLOBIN: 10.9 g/dL — ABNORMAL LOW (ref 12.9–16.5)
LYMPHOCYTES ABSOLUTE COUNT: 1.1 10*9/L (ref 1.1–3.6)
LYMPHOCYTES RELATIVE PERCENT: 9.7 %
MEAN CORPUSCULAR HEMOGLOBIN CONC: 35.2 g/dL (ref 32.0–36.0)
MEAN CORPUSCULAR HEMOGLOBIN: 32.2 pg (ref 25.9–32.4)
MEAN CORPUSCULAR VOLUME: 91.5 fL (ref 77.6–95.7)
MEAN PLATELET VOLUME: 7.6 fL (ref 6.8–10.7)
MONOCYTES ABSOLUTE COUNT: 0.5 10*9/L (ref 0.3–0.8)
MONOCYTES RELATIVE PERCENT: 4 %
NEUTROPHILS ABSOLUTE COUNT: 9.6 10*9/L — ABNORMAL HIGH (ref 1.8–7.8)
NEUTROPHILS RELATIVE PERCENT: 85.8 %
PLATELET COUNT: 194 10*9/L (ref 150–450)
RED BLOOD CELL COUNT: 3.38 10*12/L — ABNORMAL LOW (ref 4.26–5.60)
RED CELL DISTRIBUTION WIDTH: 18.7 % — ABNORMAL HIGH (ref 12.2–15.2)
WBC ADJUSTED: 11.2 10*9/L (ref 3.6–11.2)

## 2020-07-20 LAB — MAGNESIUM: MAGNESIUM: 2.2 mg/dL (ref 1.6–2.6)

## 2020-07-20 LAB — C-REACTIVE PROTEIN: C-REACTIVE PROTEIN: 210 mg/L — ABNORMAL HIGH (ref <=10.0)

## 2020-07-20 LAB — CMV DNA, QUANTITATIVE, PCR: CMV VIRAL LD: NOT DETECTED

## 2020-07-20 MED ADMIN — oxyCODONE (OxyCONTIN) 12 hr crush resistant ER/CR tablet 10 mg: 10 mg | ORAL | @ 01:00:00 | Stop: 2020-08-01

## 2020-07-20 MED ADMIN — gabapentin (NEURONTIN) capsule 300 mg: 300 mg | ORAL | @ 01:00:00

## 2020-07-20 MED ADMIN — clonazePAM (KlonoPIN) tablet 0.5 mg: .5 mg | ORAL | @ 12:00:00

## 2020-07-20 MED ADMIN — valACYclovir (VALTREX) tablet 500 mg: 500 mg | ORAL | @ 12:00:00

## 2020-07-20 MED ADMIN — carvediloL (COREG) tablet 12.5 mg: 12.5 mg | ORAL | @ 01:00:00

## 2020-07-20 MED ADMIN — lidocaine (PF) (XYLOCAINE-MPF) 10 mg/mL (1 %) injection: @ 17:00:00 | Stop: 2020-07-20

## 2020-07-20 MED ADMIN — sulfamethoxazole-trimethoprim (BACTRIM DS) 800-160 mg tablet 160 mg of trimethoprim: 1 | ORAL | @ 01:00:00 | Stop: 2020-08-01

## 2020-07-20 MED ADMIN — cefepime (MAXIPIME) 2 g in sodium chloride 0.9 % (NS) 100 mL IVPB-connector bag: 2 g | INTRAVENOUS | @ 18:00:00 | Stop: 2020-07-23

## 2020-07-20 MED ADMIN — remimazolam (BYFAVO) injection: INTRAVENOUS | @ 17:00:00 | Stop: 2020-07-20

## 2020-07-20 MED ADMIN — sodium chloride irrigation (NS) 0.9 % irrigation solution: @ 17:00:00 | Stop: 2020-07-20

## 2020-07-20 MED ADMIN — HYDROmorphone (DILAUDID) tablet 4 mg: 4 mg | ORAL | @ 10:00:00 | Stop: 2020-07-25

## 2020-07-20 MED ADMIN — HYDROmorphone (DILAUDID) tablet 4 mg: 4 mg | ORAL | @ 22:00:00 | Stop: 2020-07-25

## 2020-07-20 MED ADMIN — PONATinib (ICLUSIG) tablet 30 mg **PATIENT SUPPLIED**: 30 mg | ORAL | @ 19:00:00

## 2020-07-20 MED ADMIN — predniSONE (DELTASONE) tablet 10 mg: 10 mg | ORAL | @ 12:00:00

## 2020-07-20 MED ADMIN — carvediloL (COREG) tablet 12.5 mg: 12.5 mg | ORAL | @ 12:00:00

## 2020-07-20 MED ADMIN — sulfamethoxazole-trimethoprim (BACTRIM DS) 800-160 mg tablet 160 mg of trimethoprim: 1 | ORAL | @ 12:00:00 | Stop: 2020-07-20

## 2020-07-20 MED ADMIN — HYDROmorphone (DILAUDID) tablet 4 mg: 4 mg | ORAL | @ 14:00:00 | Stop: 2020-07-25

## 2020-07-20 MED ADMIN — hydrOXYzine (ATARAX) tablet 10 mg: 10 mg | ORAL | @ 18:00:00

## 2020-07-20 MED ADMIN — HYDROmorphone (DILAUDID) tablet 4 mg: 4 mg | ORAL | @ 18:00:00 | Stop: 2020-07-25

## 2020-07-20 MED ADMIN — nortriptyline (PAMELOR) capsule 25 mg: 25 mg | ORAL | @ 01:00:00

## 2020-07-20 MED ADMIN — aspirin chewable tablet 81 mg: 81 mg | ORAL | @ 12:00:00

## 2020-07-20 MED ADMIN — cefepime (MAXIPIME) 2 g in sodium chloride 0.9 % (NS) 100 mL IVPB-connector bag: 2 g | INTRAVENOUS | @ 06:00:00 | Stop: 2020-07-23

## 2020-07-20 MED ADMIN — fentaNYL (PF) (SUBLIMAZE) injection: INTRAVENOUS | @ 17:00:00 | Stop: 2020-07-20

## 2020-07-20 MED ADMIN — HYDROmorphone (DILAUDID) tablet 4 mg: 4 mg | ORAL | @ 06:00:00 | Stop: 2020-07-25

## 2020-07-20 MED ADMIN — oxyCODONE (OxyCONTIN) 12 hr crush resistant ER/CR tablet 10 mg: 10 mg | ORAL | @ 12:00:00 | Stop: 2020-08-01

## 2020-07-20 MED ADMIN — gabapentin (NEURONTIN) capsule 300 mg: 300 mg | ORAL | @ 18:00:00

## 2020-07-20 MED ADMIN — hydrOXYzine (ATARAX) tablet 10 mg: 10 mg | ORAL | @ 02:00:00

## 2020-07-20 MED ADMIN — clonazePAM (KlonoPIN) tablet 0.5 mg: .5 mg | ORAL | @ 01:00:00

## 2020-07-20 MED ADMIN — amLODIPine (NORVASC) tablet 10 mg: 10 mg | ORAL | @ 12:00:00

## 2020-07-20 MED ADMIN — blinatumomab (BLINCYTO) 28 mcg/day in sodium chloride 0.9% NON-PVC 0.9 % IVPB (24-HR INFUSION): 28 ug/d | INTRAVENOUS | @ 18:00:00 | Stop: 2020-07-21

## 2020-07-20 MED ADMIN — isavuconazonium sulfate (CRESEMBA) capsule 372 mg: 372 mg | ORAL | @ 20:00:00 | Stop: 2020-07-22

## 2020-07-20 MED ADMIN — gabapentin (NEURONTIN) capsule 300 mg: 300 mg | ORAL | @ 12:00:00

## 2020-07-20 MED ADMIN — HYDROmorphone (DILAUDID) tablet 4 mg: 4 mg | ORAL | @ 02:00:00 | Stop: 2020-07-25

## 2020-07-20 NOTE — Unmapped (Signed)
IMMUNOCOMPROMISED HOST INFECTIOUS DISEASE CONSULT NOTE    Adam Keith is being seen in consultation at the request of Avie Arenas,* for evaluation of pulmonary nodules.    Assessment  40 y.o. male  1. 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   -now day 27 of cycle 1 of Blinatumomab (anti-CD19/CD3)  - Intrathecal??ppx (rare blast on prior CSF), last 03/13/2020  - 04/16/2020 BMBx shows remission with negative MRD however with minimal BCRABL remaining  - Right IJ Port-A-Cath placed 04/15/2020  2.Drug Intolerances/allergies  Ceftaroline - rash and leukopenia  Dapsone - possible leukopenia  Vancomycin - probable ototoxicity (with furosemide)  Isavuconazole - elevated LFTs in the setting of TKI   3. 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 tree-in-bud nodularity, resolving on CT 04/28/20  7. COVID-19 05/28/20    8. Non-neutropenic fever and new pulmonary nodules/infiltrates 07/17/20  - 4/15 BC x2 NGTD  - 4/16 rapid flu/rsv/covid negative,   - 4/16 CT Chest: multifocal somewhat nodular consolidative opacities in b/l lungs that are relatively new, most concerning for infectious PNA (possibly fungal)  - 4/17 LRCx +2 S aureus, fungal and AFB pending too.   - 4/17 legionella Uag negative, serum crypto ag negative, Serum galactomannan pending, serum CMV neg, urine histo ag pending, fungitell pending   - 4/18 bronch with BAL --> RPP negative, rest of studies (legionella cx, AFB cx, bronch cx, fungal cx, CMV PCR, PJP DFA, AG galactomannan) pending  tx  4/15 cefepime, daptomycin --> 4/16 cefepime --> 4/18 cefepime/cresemba    Recommendations:  Diagnostically:  - f/u 4/18 bronch studies (RPP negative), gram stain negative  - 4/17 LRCx + S aureus   - please send MRSA screen  - f/u 4/17 sputum fungal/afb cx, 4/15 BC x2 (NGTD), serum AG galactomannan, fungitell ,urine histo ag, serum CMV PCR     Therapeutically:  - Continue cefepime  -start vancomycin (dosing per pharmacy)  - Start isavuconazonium ( d/t concern for DDIs with posa and ponatinib)      The ICH ID service will continue to follow.  Please page the ID Transplant/Liquid Oncology Fellow consult at [phone] with questions.  Patient discussed with Dr. Verlan Friends     Immunocompromised Host ID Attending Addendum  I saw and evaluated the patient. I discussed and agree with the findings and the plan of care as documented in the fellow???s note.The patient is at risk of decompensation from infection due to underlying immunocompromise and/or mucosal barrier defects and/or presence of medical devices.   I personally reviewed updated microbiological culture and susceptibility data.  I personally reviewed updated relevant radiological studies.    Timothy Lasso [facility]  Immunocompromised Host Infectious Diseases  Pager [phone]        Vertell Novak, MD  Clinical Fellow  [facility] Infectious Diseases      Interval Events;  NAEO, afebrile, HDS. Team planning on bronch with biopsy today, and holding fungal coverage until after bronch given clinical stability.   This am, tired, fatigued, malaised. No O2 requirements. Denies nausea, but no appetite. Has diffuse blanching rash that had been getting worse with steroid taper (burning/itchy). No arthralgias/myalgias        Medications:   Scheduled Meds:  ???  amLODIPine  10 mg Oral Daily   ??? aspirin  81 mg Oral Daily   ??? blinatumomab (BLINCYTO) 15 mcg/m2/day (max 28 mcg/day) 24-hr infusion  28 mcg/day Intravenous over 24 hr   ??? carvediloL  12.5 mg Oral BID   ??? Cefepime  2 g Intravenous Q12H   ??? clonazePAM  0.5 mg Oral Nightly   ??? gabapentin  300 mg Oral TID   ??? HYDROmorphone  4 mg Oral Q4H   ??? nortriptyline  25 mg Oral Nightly   ??? oxyCODONE  10 mg Oral Q12H SCH   ??? polyethylene glycol  17 g Oral Daily   ??? PONATinib  30 mg Oral Q24H   ??? predniSONE  10 mg Oral Daily   ??? sulfamethoxazole-trimethoprim  1 tablet Oral BID   ??? valACYclovir  500 mg Oral Daily       Physical Exam:  Temp:  [36.1 ??C-37 ??C] 36.2 ??C  Heart Rate:  [90-115] 90  Resp:  [16] 16  BP: (112-138)/(61-77) 138/74  MAP (mmHg):  [80-101] 101  SpO2:  [90 %-98 %] 98 %     Gen: tired appearing, NAD  HEENT: dry lips, clear conjunctiva  Cardiac: RRR, no murmurs appreciated, minimal edema of LE  Pulm: CTAB posteriorly  Abd: soft, NTND  Skin: diffuse red blanching rash mostly on face, inner thighs, inner arms  Neuro: no focal deficits    Labs:  Lab Results   Component Value Date    WBC 11.2 07/20/2020    WBC 13.7 (H) 07/19/2020    WBC 19.8 (H) 07/18/2020    WBC 14.6 (H) 07/17/2020    WBC 11.7 (H) 07/13/2020    Absolute Neutrophils 9.6 (H) 07/20/2020    Absolute Neutrophils 11.3 (H) 07/19/2020    Absolute Neutrophils 14.1 (H) 07/18/2020    Absolute Eosinophils 0.0 07/20/2020    Absolute Eosinophils 0.5 07/19/2020    Platelet 194 07/20/2020     Lab Results   Component Value Date    Creatinine 2.15 (H) 07/20/2020    Creatinine 1.86 (H) 07/19/2020    Creatinine 2.10 (H) 07/18/2020    Creatinine 2.07 (H) 07/17/2020    Creatinine 1.99 (H) 07/13/2020     Lab Results   Component Value Date    Sed Rate 30 (H) 07/18/2020    CRP 210.0 (H) 07/20/2020     Lab Results   Component Value Date    Total Bilirubin 0.5 07/17/2020    AST 13 07/17/2020    ALT 15 07/17/2020    Alkaline Phosphatase 69 07/17/2020    Alkaline Phosphatase 70 07/13/2020    Lactate, Arterial 1.6 (H) 01/31/2020    Lactate, Arterial 2.6 (H) 01/30/2020    Lactate, Arterial 2.9 (H) 01/28/2020    Lactate, Venous 1.4 07/18/2020    Lactate, Venous 2.1 (H) 07/17/2020    Lactate, Venous 0.7 05/31/2020       Microbiology:      Microbiology Results (last day) Procedure Component Value Date/Time Date/Time    Lower Respiratory Culture [[id number]]  (Abnormal) Collected: 07/19/20 1435    Lab Status: Preliminary result Specimen: SPUTUM EXPECTORATED Updated: 07/20/20 1543     Lower Respiratory Culture 2+ Staphylococcus aureus     Gram Stain >25 PMNS/LPF      10-25 Epithelial cells/LPF      1+ Smear Results Suggest Mixed oral flora      Acceptable for culture    Narrative:      Specimen Source: SPUTUM EXPECTORATED    Body  fluid cell count [[id number]] Collected: 07/20/20 1306    Lab Status: Final result Specimen: Lavage, Bronchial Updated: 07/20/20 1509     Fluid Type Lavage, Bronchial     Color, Fluid Straw     Appearance, Fluid Hazy     Nucleated Cells, Fluid 467 ul      RBC, Fluid 95 ul      Neutrophil %, Fluid 17.0 %      Lymphocytes %, Fluid 1.0 %      Mono/Macro % , Fluid 5.0 %      Other Cells %, Fluid 77.0 %      #Cells Counted BF Diff 100     Fluid Comments --     Comment: Tissue cells present        Legionella Culture [[id number]] Collected: 07/20/20 1306    Lab Status: In process Specimen: Lavage, Bronchial from Lung, Right Upper Lobe Updated: 07/20/20 1457    Bronchial culture [[id number]] Collected: 07/20/20 1306    Lab Status: Preliminary result Specimen: Lavage, Bronchial from Lung, Right Upper Lobe Updated: 07/20/20 1456     Gram Stain Result 1+ Polymorphonuclear leukocytes      No organisms seen    Narrative:      Specimen Source: Lung, Right Upper Lobe    Respiratory Pathogen Panel with COVID-19 [[id number]]  (Normal) Collected: 07/20/20 1307    Lab Status: Final result Specimen: Lavage, Bronchial from Lung, Right Upper Lobe Updated: 07/20/20 1453     Adenovirus Not Detected     Coronavirus HKU1 Not Detected     Coronavirus NL63 Not Detected     Coronavirus 229E Not Detected     Coronavirus OC43 PCR Not Detected     Metapneumovirus Not Detected     Rhinovirus/Enterovirus Not Detected     Influenza A Not Detected     Influenza B Not Detected     Parainfluenza 1 Not Detected     Parainfluenza 2 Not Detected     Parainfluenza 3 Not Detected     Parainfluenza 4 Not Detected     RSV Not Detected     Chlamydophila (Chlamydia) pneumoniae Not Detected     Mycoplasma pneumoniae Not Detected     SARS-CoV-2 PCR Not Detected    Narrative:      This result was obtained using the FDA-cleared [employer] Respiratory 2.1 Panel. Performance characteristics have been established and verified by the Clinical Molecular Microbiology Laboratory, [facility]. This assay does not distinguish between rhinovirus and enterovirus. Lower respiratory specimens will not be tested for Bordetella pertussis/parapertussis. For nasopharyngeal swabs, cross-reactivity may occur between B. pertussis and non-pertussis Bordetella species. All positive B. pertussis results will be automatically confirmed using our in-house PCR assay. Ct (cycle threshold) values for SARS-CoV-2 are not available for this test.    Pneumocystis DFA [[id number]] Collected: 07/20/20 1306    Lab Status: In process Specimen: Lavage, Bronchial from Lung, Right Upper Lobe Updated: 07/20/20 1329    Fungal Culture [[id number]] Collected: 07/20/20 1306    Lab Status: In process Specimen: Lavage, Bronchial from Lung, Right Upper Lobe Updated: 07/20/20 1326    AFB SMEAR [[id number]] Collected: 07/20/20 1306    Lab Status: In process Specimen: Lavage, Bronchial from Lung, Right Upper Lobe Updated: 07/20/20 1326    AFB culture [[id number]] Collected: 07/20/20 1306    Lab Status: In process Specimen: Lavage, Bronchial from Lung, Right Upper Lobe Updated: 07/20/20 1326    Aspergillus Galactomannan AG, BAL [[id number]] Collected: 07/20/20 1306  Lab Status: In process Specimen: Lavage, Bronchial from Lung, Right Upper Lobe Updated: 07/20/20 1326    CMV PCR, Qualitative, Not Blood [[id number]] Collected: 07/20/20 1306    Lab Status: In process Specimen: Lavage, Bronchial from Lung, Right Upper Lobe Updated: 07/20/20 1326    AFB SMEAR [[id number]] Collected: 07/19/20 1435    Lab Status: Final result Specimen: SPUTUM EXPECTORATED Updated: 07/20/20 1258     AFB Smear NO ACID FAST BACILLI SEEN- 3 negative smears do not exclude pulmonary TB. If active pulmonary TB is suspected, continue airborne isolation until pulmonary disease is excluded by negative cultures.    Fungal Culture [[id number]] Collected: 07/19/20 1435    Lab Status: Preliminary result Specimen: SPUTUM EXPECTORATED Updated: 07/20/20 0830     Fungus Stain NO FUNGI SEEN    Narrative:      Specimen Source: SPUTUM EXPECTORATED    Blood Culture [[id number]]  (Normal) Collected: 07/17/20 2140    Lab Status: Preliminary result Specimen: Blood from 1 Peripheral Draw Updated: 07/19/20 2245     Blood Culture, Routine No Growth at 48 hours    Blood Culture [[id number]]  (Normal) Collected: 07/17/20 2141    Lab Status: Preliminary result Specimen: Blood from 1 Peripheral Draw Updated: 07/19/20 2245     Blood Culture, Routine No Growth at 48 hours        Studies:   No results found.

## 2020-07-20 NOTE — Unmapped (Signed)
Brief Operative Note  (CSN: [id number])      Date of Surgery: 07/20/2020    Pre-op Diagnosis: BAL    Post-op Diagnosis: Pulmonary infiltrate    Procedure(s):  BRONCHOSCOPY, RIGID OR FLEXIBLE, INCLUDE FLUOROSCOPIC GUIDANCE WHEN PERFORMED; W/BRONCHIAL ALVEOLAR LAVAGE WITH MODERATE SEDATION: 40347 (CPT??)  Note: Revisions to procedures should be made in chart - see Procedures activity.    Performing Service: Pulmonary  Surgeon(s) and Role:     * Dellis Filbert, MD - Primary     * Tobi Bastos, MD - Assisting    Assistant: None    Findings: Tracheobronchial tree with friable mucosa, scattered thick mucous bilaterally.    Anesthesia: Conscious Sedation (Nurse Admin)    Estimated Blood Loss: 0 mL    Complications: None    Specimens: None collected    Implants: * No implants in log *    Surgeon Notes: I was present and scrubbed for the entire procedure    Tobi Bastos   Date: 07/20/2020  Time: 1:02 PM

## 2020-07-20 NOTE — Unmapped (Signed)
Please see separate procedure note under the Procedures tab in Epic.     Donita Brooks, MD  Pulmonology Fellow

## 2020-07-20 NOTE — Unmapped (Cosign Needed)
Hematology Resident (MEDE) Progress Note    Assessment & Plan:   Adam 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 He presents with fevers and concerning findings for sepsis from a likely pulmonary source.    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 *      Fever c/f Sepsis from likely pulmonary source vs. Cytokine release syndrome  Patient febrile as of 4/15 with increased hoarseness, SOB, DOE, and increased sputum production who was found to be tachypneic and on CT chest had findings concerning for possible fungal pathogen. Other sources of infection include urine which is unlikely in setting of normal UA and bloodstream. Alternative etiologies include CRS though this would be unusual as patient is D27 of treatment with blinatumomab.   -S/p Daptomycin (4/15-4/16)  -Continue Cefepime  2g Q8 hours  -Start Cresemba today (4/18-  -Bronchoscopy performed today   -F/u BAL studies  -F/u lower respiratory fungal, AFB, and bacterial cultures  -Consult to Pulmonology    Ph+ ALL   Diagnosed in 01/2020 now in CR without flow MRD after GRAAPH induction and currently cycle 1 of Blinatumomab + ponatinib. Followed by [name].  -C1D28 Blinatumumab + Ponatinib  -Continue ponatinib and Blinatumomab  -Continue prophylactic daily valtrex,  Bactrim (2-3x/week), and ASA    DRESS??syndrome: ??Followed by Dr. Caryn Section with [facility]. S/p punch biopsy on 3/11. Onset proximate to dose adjustment of dasatinib, and administration of posaconazole and sotrovimab. Currently on prednisone taper: 40mg  daily??x 7??(3/25-3/31); 30mg  dalily x 7 (4/1- 4/7); 20mg  daily x 7 (4/8-4/14); 10 mg daily x 7 (4/15-4/21).  -Prednisone 10 mg daily  -PRN hydroxyzine??    Chronic Problems:  Tobacco use - Nicotine withdrawal  -Nicotine lozenge and gum   -Pt working on cessation with outpatient psychiatrist    MDD and anxiety with remote suicide attempt (age 20)   - continue nortriptyline 25mg    -Continue Klonopin to 0.5 mg nightly with additional 0.5mg  PRN for breakthrough daytime anxiety    Chronic back pain: Follows with [facility] palliative care.  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 Avoid lasix/loop diuretics, other ototoxic medications  Hypertension:  Continue Amlodipine and Coreg  CKD: Creatinine at baseline    Daily Checklist:  Diet: Regular Diet  DVT PPx: Contraindicated 2/2 Held in setting of upcoming procedure  Electrolytes: No Repletion Needed  Code Status: Full Code  Dispo: continue floor care    Team Contact Information:   Primary Team: Hematology Resident (MEDE)  Primary Resident: Chrys Racer, MD  Resident's Pager: [phone] (Hematology Intern - White)    Interval History:   No acute events overnight.  Patient feeling much better this morning though with increased anxiety regarding bronchoscopy scheduled this morning and lack of usual coping mechanism (smoking).   All other systems were reviewed and are negative except as noted in the HPI    Objective:   Temp:  [35.7 ??C (96.2 ??F)-36.8 ??C (98.2 ??  F)] 36.2 ??C (97.2 ??F)  Heart Rate:  [71-106] 89  Resp:  [11-22] 16  BP: (112-146)/(61-105) 126/82  FiO2 (%):  [100 %] 100 %  SpO2:  [90 %-100 %] 96 %    Gen: WDWN in NAD, answers questions appropriately  HENT: atraumatic, MMM, OP w/o erythema or exudate   Heart: RRR, S1, S2, no M/R/G, no chest wall tenderness  Lungs: CTAB, no crackles or wheezes, no use of accessory muscles  Abdomen: Normoactive bowel sounds, soft, NTND, no rebound/guarding  Extremities: no clubbing, cyanosis, or edema in the BLEs  Skin: Erythematous patches of bilateral inner thighs  Psych: Alert, oriented, appropriate mood and affect    Labs/Studies: Labs and Studies from the last 24hrs per EMR and Reviewed    Note prepared by Chrys Racer, MS4      I attest that I have reviewed the student note and that the components of the history of the present illness, the physical exam, and the assessment and plan documented were performed by me or were performed in my presence by the student where I verified the documentation and performed (or re-performed) the exam and medical decision making.    Geoffry Paradise, MD PGY -1

## 2020-07-20 NOTE — Unmapped (Signed)
Patient completed care today without difficulty. C1 D7 IP/OP LEUKEMIA BLINATUMOMAB 7-DAY INFUSION, no signs/symptoms of intolerance. Pain and itching managed to satisfaction with scheduled and prn medications. OK to leave unit for reasonable amount of time when off chemo per MD order. Patient compliant. Sentence writing completed accurately. No changes to plan of care.    Problem: Adult Inpatient Plan of Care  Goal: Plan of Care Review  Outcome: Progressing  Goal: Patient-Specific Goal (Individualized)  Outcome: Progressing  Goal: Absence of Hospital-Acquired Illness or Injury  Outcome: Progressing  Intervention: Identify and Manage Fall Risk  Recent Flowsheet Documentation  Taken 07/20/2020 0705 by Regina Eck, RN  Safety Interventions:   chemotherapeutic agent precautions   lighting adjusted for tasks/safety   low bed   nonskid shoes/slippers when out of bed  Intervention: Prevent Infection  Recent Flowsheet Documentation  Taken 07/20/2020 0705 by Regina Eck, RN  Infection Prevention: cohorting utilized  Goal: Optimal Comfort and Wellbeing  Outcome: Progressing  Goal: Readiness for Transition of Care  Outcome: Progressing  Goal: Rounds/Family Conference  Outcome: Progressing     Problem: Behavioral Health Comorbidity  Goal: Maintenance of Behavioral Health Symptom Control  Outcome: Progressing     Problem: Hypertension Comorbidity  Goal: Blood Pressure in Desired Range  Outcome: Progressing     Problem: Pain Chronic (Persistent) (Comorbidity Management)  Goal: Acceptable Pain Control and Functional Ability  Outcome: Progressing     Problem: Impaired Wound Healing  Goal: Optimal Wound Healing  Outcome: Progressing

## 2020-07-20 NOTE — Unmapped (Incomplete Revision)
Hematology Resident (MEDE) Progress Note  I saw and evaluated the patient, participating in the key portions of the service.  I reviewed the resident???s note.  I agree with the resident???s findings and plan. Gae Bon, MD  Assessment & Plan:   Adam 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 He presents with fevers and concerning findings for sepsis from a likely pulmonary source.    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 *      Fever c/f Sepsis from likely pulmonary source vs. Cytokine release syndrome  Patient febrile as of 4/15 with increased hoarseness, SOB, DOE, and increased sputum production who was found to be tachypneic and on CT chest had findings concerning for possible fungal pathogen. Other sources of infection include urine which is unlikely in setting of normal UA and bloodstream. Alternative etiologies include CRS though this would be unusual as patient is D27 of treatment with blinatumomab.   -S/p Daptomycin (4/15-4/16)  -Continue Cefepime  2g Q8 hours  -Start Cresemba today (4/18-  -Bronchoscopy performed today   -F/u BAL studies  -F/u lower respiratory fungal, AFB, and bacterial cultures  -Consult to Pulmonology    Ph+ ALL   Diagnosed in 01/2020 now in CR without flow MRD after GRAAPH induction and currently cycle 1 of Blinatumomab + ponatinib. Followed by [name].  -C1D28 Blinatumumab + Ponatinib  -Continue ponatinib and Blinatumomab  -Continue prophylactic daily valtrex,  Bactrim (2-3x/week), and ASA    DRESS syndrome:  Followed by Dr. Caryn Section with [facility]. S/p punch biopsy on 3/11. Onset proximate to dose adjustment of dasatinib, and administration of posaconazole and sotrovimab. Currently on prednisone taper: 40mg  daily x 7 (3/25-3/31); 30mg  dalily x 7 (4/1- 4/7); 20mg  daily x 7 (4/8-4/14); 10 mg daily x 7 (4/15-4/21).  -Prednisone 10 mg daily  -PRN hydroxyzine     Chronic Problems:  Tobacco use - Nicotine withdrawal  -Nicotine lozenge and gum   -Pt working on cessation with outpatient psychiatrist    MDD and anxiety with remote suicide attempt (age 58)   - continue nortriptyline 25mg    -Continue Klonopin to 0.5 mg nightly with additional 0.5mg  PRN for breakthrough daytime anxiety    Chronic back pain: Follows with [facility] palliative care.  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 Avoid lasix/loop diuretics, other ototoxic medications  Hypertension:  Continue Amlodipine and Coreg  CKD: Creatinine at baseline    Daily Checklist:  Diet: Regular Diet  DVT PPx: Contraindicated 2/2 Held in setting of upcoming procedure  Electrolytes: No Repletion Needed  Code Status: Full Code  Dispo:  continue floor care    Team Contact Information:   Primary Team: Hematology Resident (MEDE)  Primary Resident: Chrys Racer, MD  Resident's Pager: [phone] (Hematology Intern - White)    Interval History:   No acute events overnight.  Patient feeling much better this morning though with increased anxiety regarding bronchoscopy scheduled this  morning and lack of usual coping mechanism (smoking).   All other systems were reviewed and are negative except as noted in the HPI    Objective:   Temp:  [35.7 ??C (96.2 ??F)-36.8 ??C (98.2 ??F)] 36.2 ??C (97.2 ??F)  Heart Rate:  [71-106] 89  Resp:  [11-22] 16  BP: (112-146)/(61-105) 126/82  FiO2 (%):  [100 %] 100 %  SpO2:  [90 %-100 %] 96 %    Gen: WDWN in NAD, answers questions appropriately  HENT: atraumatic, MMM, OP w/o erythema or exudate Heart: RRR, S1, S2, no M/R/G, no chest wall tenderness  Lungs: CTAB, no crackles or wheezes, no use of accessory muscles  Abdomen: Normoactive bowel sounds, soft, NTND, no rebound/guarding  Extremities: no clubbing, cyanosis, or edema in the BLEs  Skin: Erythematous patches of bilateral inner thighs  Psych: Alert, oriented, appropriate mood and affect    Labs/Studies: Labs and Studies from the last 24hrs per EMR and Reviewed    Note prepared by Chrys Racer, MS4      I attest that I have reviewed the student note and that the components of the history of the present illness, the physical exam, and the assessment and plan documented were performed by me or were performed in my presence by the student where I verified the documentation and performed (or re-performed) the exam and medical decision making.    Geoffry Paradise, MD PGY -1

## 2020-07-21 DIAGNOSIS — J189 Pneumonia, unspecified organism: Principal | ICD-10-CM

## 2020-07-21 DIAGNOSIS — C9100 Acute lymphoblastic leukemia not having achieved remission: Principal | ICD-10-CM

## 2020-07-21 DIAGNOSIS — A419 Sepsis, unspecified organism: Principal | ICD-10-CM

## 2020-07-21 DIAGNOSIS — D801 Nonfamilial hypogammaglobulinemia: Principal | ICD-10-CM

## 2020-07-21 LAB — BASIC METABOLIC PANEL
ANION GAP: 10 mmol/L (ref 5–14)
BLOOD UREA NITROGEN: 24 mg/dL — ABNORMAL HIGH (ref 9–23)
BUN / CREAT RATIO: 12
CALCIUM: 9.2 mg/dL (ref 8.7–10.4)
CHLORIDE: 110 mmol/L — ABNORMAL HIGH (ref 98–107)
CO2: 20 mmol/L (ref 20.0–31.0)
CREATININE: 1.97 mg/dL — ABNORMAL HIGH
EGFR CKD-EPI AA MALE: 48 mL/min/{1.73_m2} — ABNORMAL LOW (ref >=60)
EGFR CKD-EPI NON-AA MALE: 41 mL/min/{1.73_m2} — ABNORMAL LOW (ref >=60)
GLUCOSE RANDOM: 121 mg/dL (ref 70–179)
POTASSIUM: 3.9 mmol/L (ref 3.4–4.8)
SODIUM: 140 mmol/L (ref 135–145)

## 2020-07-21 LAB — CBC W/ AUTO DIFF
BASOPHILS ABSOLUTE COUNT: 0 10*9/L (ref 0.0–0.1)
BASOPHILS RELATIVE PERCENT: 0.3 %
EOSINOPHILS ABSOLUTE COUNT: 0.6 10*9/L — ABNORMAL HIGH (ref 0.0–0.5)
EOSINOPHILS RELATIVE PERCENT: 3 %
HEMATOCRIT: 33.9 % — ABNORMAL LOW (ref 39.0–48.0)
HEMOGLOBIN: 11 g/dL — ABNORMAL LOW (ref 12.9–16.5)
LYMPHOCYTES ABSOLUTE COUNT: 2.3 10*9/L (ref 1.1–3.6)
LYMPHOCYTES RELATIVE PERCENT: 12.2 %
MEAN CORPUSCULAR HEMOGLOBIN CONC: 32.4 g/dL (ref 32.0–36.0)
MEAN CORPUSCULAR HEMOGLOBIN: 30.3 pg (ref 25.9–32.4)
MEAN CORPUSCULAR VOLUME: 93.5 fL (ref 77.6–95.7)
MEAN PLATELET VOLUME: 7.6 fL (ref 6.8–10.7)
MONOCYTES ABSOLUTE COUNT: 0.6 10*9/L (ref 0.3–0.8)
MONOCYTES RELATIVE PERCENT: 3.2 %
NEUTROPHILS ABSOLUTE COUNT: 15.3 10*9/L — ABNORMAL HIGH (ref 1.8–7.8)
NEUTROPHILS RELATIVE PERCENT: 81.3 %
PLATELET COUNT: 229 10*9/L (ref 150–450)
RED BLOOD CELL COUNT: 3.62 10*12/L — ABNORMAL LOW (ref 4.26–5.60)
RED CELL DISTRIBUTION WIDTH: 18.6 % — ABNORMAL HIGH (ref 12.2–15.2)
WBC ADJUSTED: 18.8 10*9/L — ABNORMAL HIGH (ref 3.6–11.2)

## 2020-07-21 LAB — C-REACTIVE PROTEIN: C-REACTIVE PROTEIN: 82 mg/L — ABNORMAL HIGH (ref <=10.0)

## 2020-07-21 LAB — ASPERGILLUS GALACTOMANNAN ANTIGEN, SERUM: ASPERGILLUS AG SERUM: <0.5 (ref <0.5)

## 2020-07-21 LAB — SLIDE REVIEW

## 2020-07-21 LAB — FUNGITELL ASSAY
FUNGITELL QUALITATIVE: NEGATIVE
FUNGITELL: <31 pg/mL

## 2020-07-21 LAB — IGG: GAMMAGLOBULIN; IGG: 366 mg/dL — ABNORMAL LOW (ref 646–2013)

## 2020-07-21 LAB — MAGNESIUM: MAGNESIUM: 1.9 mg/dL (ref 1.6–2.6)

## 2020-07-21 MED ORDER — LINEZOLID 600 MG TABLET
ORAL_TABLET | Freq: Two times a day (BID) | ORAL | 0 refills | 30.00000 days | Status: CP
Start: 2020-07-21 — End: 2020-07-21
  Filled 2020-07-21: qty 28, 14d supply, fill #0

## 2020-07-21 MED ORDER — MICAFUNGIN 100MG IN 100ML IVPB
INTRAVENOUS | 0 refills | 30.00000 days | Status: CP
Start: 2020-07-21 — End: 2020-08-20

## 2020-07-21 MED ORDER — PREDNISONE 10 MG TABLET
ORAL_TABLET | ORAL | 0 refills | 28.00000 days | Status: CP
Start: 2020-07-21 — End: 2020-08-18
  Filled 2020-07-23: qty 42, 28d supply, fill #0

## 2020-07-21 MED ADMIN — aspirin chewable tablet 81 mg: 81 mg | ORAL | @ 14:00:00 | Stop: 2020-07-21

## 2020-07-21 MED ADMIN — gabapentin (NEURONTIN) capsule 300 mg: 300 mg | ORAL | @ 14:00:00 | Stop: 2020-07-21

## 2020-07-21 MED ADMIN — gabapentin (NEURONTIN) capsule 300 mg: 300 mg | ORAL | @ 17:00:00 | Stop: 2020-07-21

## 2020-07-21 MED ADMIN — gabapentin (NEURONTIN) capsule 300 mg: 300 mg | ORAL | @ 01:00:00

## 2020-07-21 MED ADMIN — oxyCODONE (OxyCONTIN) 12 hr crush resistant ER/CR tablet 10 mg: 10 mg | ORAL | @ 14:00:00 | Stop: 2020-07-21

## 2020-07-21 MED ADMIN — clonazePAM (KlonoPIN) tablet 0.5 mg: .5 mg | ORAL | @ 01:00:00

## 2020-07-21 MED ADMIN — carvediloL (COREG) tablet 12.5 mg: 12.5 mg | ORAL | @ 01:00:00

## 2020-07-21 MED ADMIN — linezolid in dextrose 5% (ZYVOX) 600 mg/300 mL IVPB 600 mg: 600 mg | INTRAVENOUS | @ 01:00:00 | Stop: 2020-07-30

## 2020-07-21 MED ADMIN — melatonin tablet 3 mg: 3 mg | ORAL | @ 01:00:00

## 2020-07-21 MED ADMIN — predniSONE (DELTASONE) tablet 10 mg: 10 mg | ORAL | @ 14:00:00 | Stop: 2020-07-21

## 2020-07-21 MED ADMIN — HYDROmorphone (DILAUDID) tablet 4 mg: 4 mg | ORAL | @ 11:00:00 | Stop: 2020-07-21

## 2020-07-21 MED ADMIN — nortriptyline (PAMELOR) capsule 25 mg: 25 mg | ORAL | @ 01:00:00

## 2020-07-21 MED ADMIN — oxyCODONE (OxyCONTIN) 12 hr crush resistant ER/CR tablet 10 mg: 10 mg | ORAL | @ 01:00:00 | Stop: 2020-08-01

## 2020-07-21 MED ADMIN — amLODIPine (NORVASC) tablet 10 mg: 10 mg | ORAL | @ 14:00:00 | Stop: 2020-07-21

## 2020-07-21 MED ADMIN — HYDROmorphone (DILAUDID) tablet 4 mg: 4 mg | ORAL | @ 02:00:00 | Stop: 2020-07-25

## 2020-07-21 MED ADMIN — cefepime (MAXIPIME) 2 g in sodium chloride 0.9 % (NS) 100 mL IVPB-connector bag: 2 g | INTRAVENOUS | @ 17:00:00 | Stop: 2020-07-21

## 2020-07-21 MED ADMIN — HYDROmorphone (DILAUDID) tablet 4 mg: 4 mg | ORAL | @ 06:00:00 | Stop: 2020-07-21

## 2020-07-21 MED ADMIN — carvediloL (COREG) tablet 12.5 mg: 12.5 mg | ORAL | @ 14:00:00 | Stop: 2020-07-21

## 2020-07-21 MED ADMIN — isavuconazonium sulfate (CRESEMBA) capsule 372 mg: 372 mg | ORAL | @ 04:00:00 | Stop: 2020-07-21

## 2020-07-21 MED ADMIN — hydrOXYzine (ATARAX) tablet 10 mg: 10 mg | ORAL | @ 02:00:00

## 2020-07-21 MED ADMIN — HYDROmorphone (DILAUDID) tablet 4 mg: 4 mg | ORAL | @ 14:00:00 | Stop: 2020-07-21

## 2020-07-21 MED ADMIN — cefepime (MAXIPIME) 2 g in sodium chloride 0.9 % (NS) 100 mL IVPB-connector bag: 2 g | INTRAVENOUS | @ 06:00:00 | Stop: 2020-07-21

## 2020-07-21 MED ADMIN — linezolid in dextrose 5% (ZYVOX) 600 mg/300 mL IVPB 600 mg: 600 mg | INTRAVENOUS | @ 14:00:00 | Stop: 2020-07-21

## 2020-07-21 MED ADMIN — isavuconazonium sulfate (CRESEMBA) capsule 372 mg: 372 mg | ORAL | @ 11:00:00 | Stop: 2020-07-21

## 2020-07-21 MED ADMIN — valACYclovir (VALTREX) tablet 500 mg: 500 mg | ORAL | @ 14:00:00 | Stop: 2020-07-21

## 2020-07-21 NOTE — Unmapped (Signed)
IMMUNOCOMPROMISED HOST INFECTIOUS DISEASE CONSULT NOTE    Adam Keith is being seen in consultation at the request of No att. providers found for evaluation of pulmonary nodules.    Assessment  40 y.o. male  1. 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   -now day 27 of cycle 1 of Blinatumomab (anti-CD19/CD3)  - Intrathecal??ppx (rare blast on prior CSF), last 03/13/2020  - 04/16/2020 BMBx shows remission with negative MRD however with minimal BCRABL remaining  - Right IJ Port-A-Cath placed 04/15/2020  2.Drug Intolerances/allergies  Ceftaroline - rash and leukopenia  Dapsone - possible leukopenia  Vancomycin - probable ototoxicity (with furosemide)  Isavuconazole - elevated LFTs in the setting of TKI   3. 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 tree-in-bud nodularity, resolving on CT 04/28/20  7. COVID-19 05/28/20    8. Non-neutropenic fever and new pulmonary nodules/infiltrates 07/17/20  - 4/15 BC x2 NGTD  - 4/16 rapid flu/rsv/covid negative,   - 4/16 CT Chest: multifocal somewhat nodular consolidative opacities in b/l lungs that are relatively new, most concerning for infectious PNA (possibly fungal, especially given his recent high dose steroid taper)  - 4/17 LRCx +2 MRSA, fungal and AFB pending too.   - 4/17 legionella Uag negative, serum crypto ag negative, Serum galactomannan pending, serum CMV neg, urine histo ag pending, fungitell pending   - 4/18 bronch with BAL --> bact cx + MRSA, RPP negative, rest of studies (legionella cx, AFB cx, fungal cx, CMV PCR, PJP DFA, AG galactomannan) pending  tx  4/15 cefepime, daptomycin --> 4/16 cefepime --> 4/18 cefepime/cresemba/linezolid -> 4/19 linezolid    #DRESS on steroids  - 3/11 punch bx diagnosed. ddx for causes included dasatinib, posaconazole, or sotrovimab  - received high dose steroids in 06/2020 (06/19/20 started at 50 mg prednisone) and has been on high dose pred taper since then (10 mg taper weekly)  - 4/15 weaned to prednisone 10 mg daily, and now is being increased to 20 mg prednisone daily      Recommendations:  Diagnostically:  - f/u 4/18 bronch studies (RPP negative), gram stain negative, bact cx + MRSA, remaining studies pending at time of discharge including BAL galactomannan, PJP DFA, CMV PCR, legionella cx, fungal cx.  - 4/17 LRCx + MRSA  - f/u 4/17 sputum fungal/afb cx, 4/15 BC x2 (NGTD), serum AG galactomannan, fungitell ,urine histo ag  - Ideally, if not on fungal coverage should have a repeat CT chest in 1 week.     Therapeutically:  - stop cefepime  - continue linezolid for MRSA (BAL Cx+ and multifocal PNA on CT) with plan for 14d course  - would start micafungin in outpatient setting,150 mg once daily, given concern for fungal pneumonia with plan for at least 2-3 week course while awaiting fungal studies but per patient discharge summary patient declined this and was discharged AMA off antifungal coverage.    Hematologic Malignancies and BMT Infectious Diseases Follow-up Instructions  - Appointment: requested in 2 weeks  - Location: 2nd Floor, [facility],[address], [place], [place]  - Labs: weekly CBC with differential, CMP  - Please fax labs to primary oncologist???s nurse navigator  - Antibiotics:   (a) linezolid  Antibiotic End Date: 08/03/20  (b) Ideally should be on fungal coverage for empiric treatment of fungal pneumonia with cresemba or micafungin 150 mg IV daily while awaiting BAL studies which was discussed in detail with the patient and his wife Adam Keith prior to discharge.     The [facility] ID service will sign off  Please page the ID Transplant/Liquid Oncology Fellow consult at [phone] with questions.  Patient discussed with Dr. Hassie Bruce, MD  Clinical Fellow  [facility] Infectious Diseases    Interval Events;  NAEO, afebrile, HDS. Eager for discharge home.     Medications:   Scheduled Meds:  ??? amLODIPine  10 mg Oral Daily   ??? aspirin  81 mg Oral Daily   ??? carvediloL  12.5 mg Oral BID   ??? Cefepime  2 g Intravenous Q12H   ??? clonazePAM  0.5 mg Oral Nightly   ??? gabapentin  300 mg Oral TID   ??? HYDROmorphone  4 mg Oral Q4H   ??? isavuconazonium sulfate  372 mg Oral Q8H    Followed by   ??? [START ON 07/23/2020] isavuconazonium sulfate  372 mg Oral Daily   ??? linezolid  600 mg Intravenous Q12H [facility]   ??? nortriptyline  25 mg Oral Nightly   ??? oxyCODONE  10 mg Oral Q12H SCH   ??? polyethylene glycol  17 g Oral Daily   ??? PONATinib  30 mg Oral Q24H   ??? predniSONE  10 mg Oral Daily   ??? sulfamethoxazole-trimethoprim  1 tablet Oral BID   ??? valACYclovir  500 mg Oral Daily       Physical Exam:  Temp:  [35.5 ??C-37 ??C] 36.9 ??C  Heart Rate:  [87-111] 87  Resp:  [18] 18  BP: (118-170)/(52-88) 153/88  MAP (mmHg):  [81-108] 108  SpO2:  [92 %-100 %] 97 %     Gen: ambulating around the room, well appearing  HEENT: clear connjunctiva  Cardiac: deferred  Pulm: normal work of breathing on room air  Abd: deferred  Skin: no rashes or bruising on clothed skin exam  Neuro: no focal deficits    Labs:  Lab Results   Component Value Date    WBC 18.8 (H) 07/21/2020    WBC 11.2 07/20/2020    WBC 13.7 (H) 07/19/2020    WBC 19.8 (H) 07/18/2020    WBC 14.6 (H) 07/17/2020    Absolute Neutrophils 15.3 (H) 07/21/2020    Absolute Neutrophils 9.6 (H) 07/20/2020    Absolute Neutrophils 11.3 (H) 07/19/2020    Absolute Eosinophils 0.6 (H) 07/21/2020    Absolute Eosinophils 0.0 07/20/2020    Platelet 229 07/21/2020     Lab Results   Component Value Date    Creatinine 1.97 (H) 07/21/2020    Creatinine 2.15 (H) 07/20/2020    Creatinine 1.86 (H) 07/19/2020    Creatinine 2.10 (H) 07/18/2020 Creatinine 2.07 (H) 07/17/2020     Lab Results   Component Value Date    Sed Rate 30 (H) 07/18/2020    CRP 82.0 (H) 07/21/2020    Total IgG 366 (L) 07/21/2020     Lab Results   Component Value Date    Total Bilirubin 0.5 07/17/2020    AST 13 07/17/2020    ALT 15 07/17/2020    Alkaline Phosphatase 69 07/17/2020    Alkaline Phosphatase 70 07/13/2020    Lactate, Arterial 1.6 (H) 01/31/2020    Lactate, Arterial 2.6 (H) 01/30/2020    Lactate, Arterial 2.9 (  H) 01/28/2020    Lactate, Venous 1.4 07/18/2020    Lactate, Venous 2.1 (H) 07/17/2020    Lactate, Venous 0.7 05/31/2020       Microbiology:      Microbiology Results (last day)     Procedure Component Value Date/Time Date/Time    AFB SMEAR [[id number]] Collected: 07/20/20 1306    Lab Status: Final result Specimen: Lavage, Bronchial from Lung, Right Upper Lobe Updated: 07/21/20 1418     AFB Smear NO ACID FAST BACILLI SEEN- 3 negative smears do not exclude pulmonary TB. If active pulmonary TB is suspected, continue airborne isolation until pulmonary disease is excluded by negative cultures.    Bronchial culture [[id number]]  (Abnormal) Collected: 07/20/20 1306    Lab Status: Preliminary result Specimen: Lavage, Bronchial from Lung, Right Upper Lobe Updated: 07/21/20 1121     Quantitative Bronchial Culture 20,000 CFU/mL Probable Methicillin Resistant Staphylococcus Aureus     Comment: Susceptibility Testing By Consultation Only        Gram Stain Result 1+ Polymorphonuclear leukocytes      No organisms seen    Narrative:      Specimen Source: Lung, Right Upper Lobe    Lower Respiratory Culture [[id number]]  (Abnormal)  (Susceptibility) Collected: 07/19/20 1435    Lab Status: Preliminary result Specimen: SPUTUM EXPECTORATED Updated: 07/21/20 1106     Lower Respiratory Culture 2+ Methicillin resistant Staphylococcus aureus      1+ Oropharyngeal Flora Isolated     Gram Stain >25 PMNS/LPF      10-25 Epithelial cells/LPF      1+ Smear Results Suggest Mixed oral flora Acceptable for culture    Narrative:      Specimen Source: SPUTUM EXPECTORATED    Susceptibility     Methicillin resistant Staphylococcus aureus (1)     Antibiotic Interpretation Microscan Method Status    Nafcillin Resistant  KIRBY BAUER Preliminary     Methicillin-resistant staphylococci are resistant to all currently available beta-lactam antibiotics EXCEPT ceftaroline.Contact Micro lab at [phone] to request ceftaroline susceptibility testing.       Erythromycin Resistant  KIRBY BAUER Preliminary    Clindamycin Resistant  KIRBY BAUER Preliminary    Doxycycline Susceptible  KIRBY BAUER Preliminary    Gentamicin Susceptible  KIRBY BAUER Preliminary     Gentamicin is used only in combination with other active agents that test susceptible       Linezolid Susceptible  KIRBY BAUER Preliminary    Trimethoprim + Sulfamethoxazole Resistant  KIRBY BAUER Preliminary    Vancomycin Susceptible 2 MIC SUSCEPTIBILITY RESULT Preliminary    Fluoroquinolone No Interpretation  KIRBY BAUER Preliminary     Fluoroquinolones are not indicated for the treatment of staphylococcal infections, including MRSA.                       Fungal Culture [[id number]] Collected: 07/20/20 1306    Lab Status: Preliminary result Specimen: Lavage, Bronchial from Lung, Right Upper Lobe Updated: 07/21/20 1049     Fungus Stain NO FUNGI SEEN    Narrative:      Specimen Source: Lung, Right Upper Lobe    Legionella Culture [[id number]] Collected: 07/20/20 1306    Lab Status: Preliminary result Specimen: Lavage, Bronchial from Lung, Right Upper Lobe Updated: 07/21/20 1004     Legionella Culture Culture in Progress    Narrative:      Specimen Source: Lung, Right Upper Lobe    Fungal Culture [[id number]] Collected: 07/19/20 1435    Lab Status: Preliminary  result Specimen: SPUTUM EXPECTORATED Updated: 07/21/20 0820     Fungal Pathogen Screen NEGATIVE TO DATE     Fungus Stain NO FUNGI SEEN    Narrative:      Specimen Source: SPUTUM EXPECTORATED    Blood Culture [[id number]]  (Normal) Collected: 07/17/20 2140    Lab Status: Preliminary result Specimen: Blood from 1 Peripheral Draw Updated: 07/20/20 2245     Blood Culture, Routine No Growth at 72 hours    Blood Culture [[id number]]  (Normal) Collected: 07/17/20 2141    Lab Status: Preliminary result Specimen: Blood from 1 Peripheral Draw Updated: 07/20/20 2245     Blood Culture, Routine No Growth at 72 hours    MRSA Screen [[id number]] Collected: 07/20/20 1751    Lab Status: In process Specimen: Nares from Nose (nasal passage) Updated: 07/20/20 1801    Body fluid cell count [[id number]] Collected: 07/20/20 1306    Lab Status: Final result Specimen: Lavage, Bronchial Updated: 07/20/20 1509     Fluid Type Lavage, Bronchial     Color, Fluid Straw     Appearance, Fluid Hazy     Nucleated Cells, Fluid 467 ul      RBC, Fluid 95 ul      Neutrophil %, Fluid 17.0 %      Lymphocytes %, Fluid 1.0 %      Mono/Macro % , Fluid 5.0 %      Other Cells %, Fluid 77.0 %      #Cells Counted BF Diff 100     Fluid Comments --     Comment: Tissue cells present        Respiratory Pathogen Panel with COVID-19 [[id number]]  (Normal) Collected: 07/20/20 1307    Lab Status: Final result Specimen: Lavage, Bronchial from Lung, Right Upper Lobe Updated: 07/20/20 1453     Adenovirus Not Detected     Coronavirus HKU1 Not Detected     Coronavirus NL63 Not Detected     Coronavirus 229E Not Detected     Coronavirus OC43 PCR Not Detected     Metapneumovirus Not Detected     Rhinovirus/Enterovirus Not Detected     Influenza A Not Detected     Influenza B Not Detected     Parainfluenza 1 Not Detected     Parainfluenza 2 Not Detected     Parainfluenza 3 Not Detected     Parainfluenza 4 Not Detected     RSV Not Detected     Chlamydophila (Chlamydia) pneumoniae Not Detected     Mycoplasma pneumoniae Not Detected     SARS-CoV-2 PCR Not Detected    Narrative:      This result was obtained using the FDA-cleared [employer] Respiratory 2.1 Panel. Performance characteristics have been established and verified by the Clinical Molecular Microbiology Laboratory, [facility]. This assay does not distinguish between rhinovirus and enterovirus. Lower respiratory specimens will not be tested for Bordetella pertussis/parapertussis. For nasopharyngeal swabs, cross-reactivity may occur between B. pertussis and non-pertussis Bordetella species. All positive B. pertussis results will be automatically confirmed using our in-house PCR assay. Ct (cycle threshold) values for SARS-CoV-2 are not available for this test.        Studies:   No results found.

## 2020-07-21 NOTE — Unmapped (Signed)
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 Discharge date = 4/19 AMA. The following is a summary of medication-related changes that occurred during admission, as well as any pharmacy-related follow-up and medication access needs.    Ph+ ALL:  - ponatinib 30mg  daily continued inpatient  - blinatumomab held on admission due to infectious work-up  - re-started blinatumomab on 07/19/20 and continued until 07/21/2020 to completely 28-day cycle.    Highlighted Medication Changes (with rationale, if applicable):  - Isavuconazole 372 mg Q8h x6 total doses started on 4/18. 4 doses administered inpatient. Then transition to 372 mg once daily. Isavuconazole used due to decreased risk for DDI with ponatinib. He does NOT have access to this medication at the time of discharge.  - linezolid 600 mg PO BID for 14 days of total therapy. First dose 4/18 at 2100. Continue for 14 days (through 5/2). Given documented MRSA on lower respiratory culture from bronch d/t concern for pneumonia.  - continue sulfamethoxazole/trimethoprim DS tablet BID on Saturday and Sunday for PJP prophylaxis.  - prednisone 10 mg x7 days taper started 4/15, discontinue after dose on 4/21 (originally started as outpatient prior to admission)    Appointments and Monitoring:  Future Appointments   Date Time Provider Department Center   07/23/2020 12:00 PM ADULT ONC LAB [facility]   07/23/2020  1:00 PM Sean Marrian Salvage, AGNP [id number]   07/29/2020 12:00 PM ADULT ONC LAB [facility]   07/29/2020  1:00 PM Sean Marrian Salvage, AGNP [id number]   07/29/2020  1:30 PM [name], CPP [id number]   08/03/2020  3:30 PM Melissa Ronalee Belts, PMHNP [id number]     Medication Access:  - Prior authorization pending for isavuconazole. Attempt to expedite with urgent PA was made due to potential for patient to leave AMA. The PA has been submitted per MAP team/referral tab.  - EXPO card to be used for linezolid prescription at discharge (called [facility] to ask them to use prior to discharge)    Outpatient Pharmacy Follow-Up:   [ ]  isavuconazole prior authorization and acquisition  [ ]  prednisone stop date after receives dose on 4/21    Fara Olden, PharmD    Ardyth Harps. Azucena Kuba, PharmD, BCOP  Hematology/Oncology Clinical Pharmacy Specialist

## 2020-07-21 NOTE — Unmapped (Signed)
Physician Discharge Summary [facility]  4 ONC [facility]  [address]  [place] [address]  Dept: [phone]  Loc: [phone]     Identifying Information:   Adam Keith  20-Apr-1980  [id number]    Primary Care Physician: [name], MD     Code Status: Full Code    Admit Date: 07/17/2020    Discharge Date: 07/21/2020     Discharge To: Against Medical Advice    Discharge Service: [facility] - Hematology Res Floor Team (MEDE)     Discharge Attending Physician: Avie Arenas, MD    Discharge Diagnoses:   Principal Problem:    Fever POA: Unknown  Active Problems:    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 *      Hospital Course:   Adam 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 Cytokine release syndrome  Patient febrile as of 4/15 with increased hoarseness, SOB, DOE, and increased sputum production who was found to be tachypneic and on CT chest have evidence of multifocal somewhat nodular consolidative opacities that were most concerning for a fungal pneumonia. Other sources of infection include urine which is unlikely in setting of normal UA and bloodstream. Alternative etiologies include CRS though this would be unusual as patient is D27 of treatment with blinatumomab. On admission, pt was started on daptomycin and cefepime but due to probable respiratory source of infection daptomycin was discontinued on 4/16. Then on 4/18, based on concern for fungal pneumonia on imaging pt was started on cresemba. Also on 4/18 patient underwent bronchoscopy to help determine cause of infection. Numerous infectious studies were sent during hospitalization from both expectorated sputum and BAL from bronchoscopy. Full respiratory pathogen panel was negative. Sputum studies were notable for MRSA though no evidence of AFB or fungi. As patient was symptomatically improving without MRSA coverage this was thought to be a contaminant and a MRSA screen was collected and sent. However, shortly before discharge his Bronchial cultures resulted showing probable MRSA, but other studies had not yet resulted at time of discharge. Pt chose to leave AMA on 4/19 despite pending infectious studies. Infectious disease was consulted regarding antibiotic management throughout admission and recommend patient continue on linezolid x14 days. He was also recommended to take Micafungin for fungal coverage however declined.     Ph+ ALL   Followed by [name]. Diagnosed in 01/2020 now in CR without flow MRD after GRAAPH induction and currently cycle 1 of Blinatumomab + ponatinib. At time of admission, blinatumumab was held for 24 hours due to concern for cytokine release syndrome and was restarted after an infectious source was identified. Ponatinib and prophylactic valtrex, bactrim and aspirin were continued throughout admission. On final day of admission patient chose to discontinue blinatumumab 4 hours early in order to leave AMA.     DRESS syndrome:  Followed by Dr. Caryn Section with [facility]. S/p punch biopsy on 3/11. Onset proximate to dose adjustment of dasatinib, and administration of posaconazole and sotrovimab. At time of admission pt  was on 10 mg of prednisone taper which was continued during hospitalization. Additionally PRN  hydroxyzine was prescribed from symptomatic management.       MDD and anxiety with remote suicide attempt (age 84)  On admission pt was continued on  nortriptyline 25mg   and Klonopin to 0.5 mg nightly with additional 0.5mg  PRN for breakthrough daytime anxiety. Pt had increased anxiety during hospitalization likely exacerbated by nicotine withdrawal. Pt declined any nicotine replacement during admission.     Chronic back pain: Follows with [facility] palliative care. Continued nightly MS Contin 15mg  nightly with PRN dilaudid 4 mg q4h for pain. Also continued miralax and senna for opioid induced constipation during hospitalization.     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 Avoided lasix/loop diuretics, other ototoxic medications    Hypertension:  Continued home Amlodipine and Coreg    CKD: Creatinine was at baseline during admission.            Outpatient Provider Follow Up Issues:   [ ]  F/u culture data as patient left before results were finalized      Touchbase with Outpatient Provider:  Warm Handoff: Not completed secondary to time    Procedures:  Bronchoscopy  ______________________________________________________________________  Discharge Medications:     Your Medication List        STOP taking these medications      traMADoL 50 mg tablet  Commonly known as: ULTRAM            START taking these medications      isavuconazonium sulfate 186 mg Cap capsule  Commonly known as: CRESEMBA  Take 2 capsules (372 mg total) by mouth daily.  Start taking on: July 23, 2020     linezolid 600 mg tablet  Commonly known as: ZYVOX  Take 1 tablet (600 mg total) by mouth every twelve (12) hours for 14 days.     micafungin 150 mg, OVERFILL 10 mL in sodium chloride 0.9 % 100 mL IVPB  Infuse 150 mg into a venous catheter daily.            CHANGE how you take these medications      carvediloL 12.5 MG tablet  Commonly known as: COREG  TAKE 1 TABLET BY MOUTH TWO TIMES A DAY.  What changed: See the new instructions.     OxyCONTIN 30 mg Tr12 12 hr crush resistant ER/CR tablet  Generic drug: oxyCODONE  Take 1 tablet (30 mg total) by mouth every twelve (12) hours.  What changed: how much to take     OxyCONTIN 15 mg 12 hr crush resistant ER/CR tablet  Generic drug: oxyCODONE  Take 1 tablet (15 mg total) by mouth nightly.  What changed: Another medication with the same name was changed. Make sure you understand how and when to take each.            CONTINUE taking these medications      acetaminophen 325 MG tablet  Commonly known as: TYLENOL  Take 650 mg by mouth every six (6) hours as needed for pain.     amLODIPine 10 MG tablet  Commonly known as: NORVASC  Take 1 tablet (10 mg total) by mouth daily.     aspirin 81 MG chewable tablet  Chew 1 tablet (81 mg total) daily.     clonazePAM 0.5 MG tablet  Commonly known as: KlonoPIN  Take 1 tablet (0.5 mg total) by mouth every twelve (12) hours for  7 days. May take an additional 1 tablet (0.5 mg total) by mouth 2 times a day as needed for anxiety.     gabapentin 300 MG capsule  Commonly known as: NEURONTIN  Take 1 capsule (300 mg total) by mouth Three (3) times a day.     hydrOXYzine 25 MG tablet  Commonly known as: ATARAX  Take 1 tablet (25 mg total) by mouth every six (6) hours as needed.     ICLUSIG 30 mg tablet  Generic drug: PONATinib  Take 1 tablet (30 mg total) by mouth daily. Swallow tablets whole. Do not crush, break, cut or chew tablets.     nortriptyline 25 MG capsule  Commonly known as: PAMELOR  Take 1 capsule (25 mg total) by mouth nightly. Start after initial 7 days of nortriptyline 10mg .     ondansetron 8 MG tablet  Commonly known as: ZOFRAN  Take 1 tablet (8 mg total) by mouth every eight (8) hours as needed for nausea (or itching).     predniSONE 10 MG tablet  Commonly known as: DELTASONE  Take 4 tablets (40 mg total) by mouth daily for 5 days, THEN 3 tablets (30 mg total) daily for 7 days, THEN 2 tablets (20 mg total) daily for 7 days, THEN 1 tablet (10 mg total) daily for 7 days.  Start taking on: June 27, 2020     prochlorperazine 10 MG tablet  Commonly known as: COMPAZINE  Take 1 tablet (10 mg total) by mouth every six (6) hours as needed for nausea (if no relief from zofran (ondansetron)).     sulfamethoxazole-trimethoprim 800-160 mg per tablet  Commonly known as: BACTRIM DS  Take 1 tablet (160 mg of trimethoprim total) by mouth 2 times a day on Saturday, Sunday. For prophylaxis while on chemo.     valACYclovir 500 MG tablet  Commonly known as: VALTREX  Take 1 tablet (500 mg total) by mouth daily.            ASK your doctor about these medications      HYDROmorphone 2 MG tablet  Commonly known as: DILAUDID  Take 1 tablet (2 mg total) by mouth every four (4) hours as needed for pain,severe (7-10) for up to 14 days.  Ask about: Should I take this medication?              Allergies:  Bupropion hcl, Dapsone, Onion, Vancomycin analogues, Bismuth subsalicylate, Ceftaroline fosamil, and Furosemide  ______________________________________________________________________  Pending Test Results:  Pending Labs       Order Current Status    AFB SMEAR In process    AFB culture In process    AFB culture In process    Aspergillus Galactomannan AG, BAL In process    Aspergillus Galactomannan Antigen, Serum In process    CMV PCR, Qualitative, Not Blood In process    Fungitell Assay In process    Histoplasma Antigen, Urine In process    MRSA Screen In process    Pneumocystis DFA In process    Blood Culture Preliminary result    Blood Culture Preliminary result    Bronchial culture Preliminary result    Fungal Culture Preliminary result    Fungal Culture Preliminary result    Legionella Culture Preliminary result    Lower Respiratory Culture Preliminary result            Most Recent Labs:  All lab results last 24 hours -   Recent Results (from the past 24 hour(s))  Basic metabolic panel    Collection Time: 07/21/20  6:52 AM   Result Value Ref Range    Sodium 140 135 - 145 mmol/L    Potassium 3.9 3.4 - 4.8 mmol/L    Chloride 110 (H) 98 - 107 mmol/L    CO2 20.0 20.0 - 31.0 mmol/L    Anion Gap 10 5 - 14 mmol/L BUN 24 (H) 9 - 23 mg/dL    Creatinine 0.98 (H) 0.60 - 1.10 mg/dL    BUN/Creatinine Ratio 12     EGFR CKD-EPI Non-African American, Male 41 (L) >=60 mL/min/1.29m2    EGFR CKD-EPI African American, Male 48 (L) >=60 mL/min/1.39m2    Glucose 121 70 - 179 mg/dL    Calcium 9.2 8.7 - 11.9 mg/dL   C-reactive protein    Collection Time: 07/21/20  6:52 AM   Result Value Ref Range    CRP 82.0 (H) <=10.0 mg/L   Magnesium Level    Collection Time: 07/21/20  6:52 AM   Result Value Ref Range    Magnesium 1.9 1.6 - 2.6 mg/dL   CBC w/ Differential    Collection Time: 07/21/20  6:52 AM   Result Value Ref Range    WBC 18.8 (H) 3.6 - 11.2 10*9/L    RBC 3.62 (L) 4.26 - 5.60 10*12/L    HGB 11.0 (L) 12.9 - 16.5 g/dL    HCT 14.7 (L) 82.9 - 48.0 %    MCV 93.5 77.6 - 95.7 fL    MCH 30.3 25.9 - 32.4 pg    MCHC 32.4 32.0 - 36.0 g/dL    RDW 56.2 (H) 13.0 - 15.2 %    MPV 7.6 6.8 - 10.7 fL    Platelet 229 150 - 450 10*9/L    Neutrophils % 81.3 %    Lymphocytes % 12.2 %    Monocytes % 3.2 %    Eosinophils % 3.0 %    Basophils % 0.3 %    Absolute Neutrophils 15.3 (H) 1.8 - 7.8 10*9/L    Absolute Lymphocytes 2.3 1.1 - 3.6 10*9/L    Absolute Monocytes 0.6 0.3 - 0.8 10*9/L    Absolute Eosinophils 0.6 (H) 0.0 - 0.5 10*9/L    Absolute Basophils 0.0 0.0 - 0.1 10*9/L    Anisocytosis Slight (A) Not Present   Morphology Review    Collection Time: 07/21/20  6:52 AM   Result Value Ref Range    Smear Review Comments See Comment (A) Undefined   IgG    Collection Time: 07/21/20  6:52 AM   Result Value Ref Range    Total IgG 366 (L) 646-2,013 mg/dL       Relevant Studies/Radiology:  ECG 12 Lead    Result Date: 07/18/2020  SINUS TACHYCARDIA POSSIBLE LEFT ATRIAL ENLARGEMENT RIGHTWARD AXIS BORDERLINE ECG WHEN COMPARED WITH ECG OF 22-Jun-2020 00:38, QT HAS SHORTENED Confirmed by Mariane Baumgarten ([id number]) on 07/18/2020 6:56:42 AM    XR Chest Portable    Result Date: 07/17/2020  EXAM: XR CHEST PORTABLE DATE: 07/17/2020 8:23 PM ACCESSION: [id number] UN DICTATED: 07/17/2020 8:29 PM INTERPRETATION LOCATION: [facility] CLINICAL INDICATION: 40 years old Male with FEVER  COMPARISON: Chest radiograph 06/22/2020 TECHNIQUE: Portable Chest Radiograph. FINDINGS: Right chest wall port terminates over the distal SVC. Streaky bibasilar opacities, likely atelectasis. No focal consolidation. The left costophrenic angle is out of the view. No right pleural effusion. No pneumothorax. Unchanged cardiomediastinal silhouette.     No acute airspace disease.    CT Chest Wo Contrast  Result Date: 07/18/2020  EXAM: CT CHEST WO CONTRAST DATE: 07/18/2020 12:17 PM ACCESSION: [id number] UN DICTATED: 07/18/2020 12:26 PM CLINICAL INDICATION: 39 years old Male with cough, fever, immunocompromis  COMPARISON: Same day chest radiograph and CTA chest 04/28/2020 TECHNIQUE: A helical CT scan was obtained without IV contrast from the thoracic inlet through the hemidiaphragms. Images were reconstructed in the axial plane.  Coronal and sagittal reformatted images of the chest were also provided for further evaluation of the lung parenchyma. FINDINGS: AIRWAYS, LUNGS, PLEURA: Clear central tracheobronchial tree.  No pleural effusion. Bilateral upper lobe ground glass opacities with intralobular septal thickening. There are multifocal consolidations with air bronchograms in the bilateral upper lobes. MEDIASTINUM: -Normal heart size. -Unchanged hyperattenuation within the left ventricle wall and the interventricular septum, which may reflect calcification related to remote infarct. No pericardial effusion. Normal caliber thoracic aorta. -Coronary artery calcifications. -No pericardial effusion. -Normal caliber thoracic aorta. -Multiple subcentimeter mediastinal lymph nodes. -Right IJ approach port catheter with tip terminating at the cavoatrial junction. IMAGED ABDOMEN: No acute findings the partially imaged abdomen. SOFT TISSUES: The right chest subcutaneous port. BONES: No acute osseous abnormalities.     1. Multifocal, somewhat nodular, consolidative opacities in both lungs are new relative to the comparison exam from April 28, 2020, and they are most concerning for infectious pneumonia. Given the nodular configuration of many of the findings, fungal pneumonia is my primary concern. 2. Similar to CT imaging of the chest dating back to March 02, 2020, there is hyperattenuation within the wall of the left cardiac ventricle which likely reflects calcification related to myocardial infarction, myocarditis, or metastatic deposition of calcium (abnormal calcium homeostasis). Notably, this finding is new relative to imaging from October 2021    ______________________________________________________________________  Discharge Instructions:                   Follow Up instructions and Outpatient Referrals     Call MD for:  extreme fatigue      Call MD for:  persistent dizziness or light-headedness      Call MD for:  persistent nausea or vomiting      Discharge instructions          Appointments which have been scheduled for you      Jul 23, 2020 12:00 PM  (Arrive by 11:30 AM)  NURSE LAB DRAW with ADULT ONC LAB  [facility] ADULT ONCOLOGY LAB DRAW STATION [facility] REGION) [address]  [place] [address]  [phone]        Jul 23, 2020  1:00 PM  (Arrive by 12:30 PM)  BONE MARROW BIOPSY with Lenon Ahmadi, AGNP  [place] ONCOLOGY INFUSION [facility] REGION) [address]  [place] [address]  [phone]        Jul 29, 2020 12:00 PM  (Arrive by 11:30 AM)  NURSE LAB DRAW with ADULT ONC LAB  [facility] ADULT ONCOLOGY LAB DRAW STATION [facility] REGION) [address]  [place] [address]  [phone]        Jul 29, 2020  1:00 PM  (Arrive by 12:30 PM)  RETURN ACTIVE [place] with Lenon Ahmadi, [place]  [place] HEMATOLOGY ONCOLOGY 2ND FLR CANCER HOSP [facility] REGION) [address]  [place] [address]  [phone] Jul 29, 2020  1:30 PM  (Arrive by 1:00 PM)  RETURN PALLIATIVE CARE with [name], CPP  [place] HEMATOLOGY ONCOLOGY 2ND FLR CANCER  HOSP [facility] REGION) [address]  [place] [address]  [phone]        Aug 03, 2020  3:30 PM  (Arrive by 3:00 PM)  RETURN VIDEO - EPIC AMWELL with Ward Givens, PMHNP  [facility] CCSP 2ND FLR CANCER HOSP [facility] REGION) [address]  [place] [address]  [phone]   Please sign into My [place] Chart at least 15 minutes before your appointment to complete any unfinished steps in the Get Started process. Get Started is required to be complete prior to your video visit.     Please visit [email or web address] for information about our safe promise as you receive care at [facility].    My [place] chart allows you to manage your health, send messages to your provider, view your test results, schedule and manage appointments, and request prescription refills securely and conveniently from your computer or mobile device.    You can go to [email or web address] to Sign in to your My [place] Chart account with your username and password. If you have forgotten them, please choose the Forgot Username? and/or Forgot Password? links to gain access. You can also access your My [place] Chart account with the free MyChart mobile app for Android or iPhone.    If you need further assistance with accessing your My [place] Chart account or for assistance in reaching your provider's office to reschedule or cancel your appointment  call [facility] at [phone].                ______________________________________________________________________  Discharge Day Services:  BP 158/75  - Pulse 88  - Temp 35.5 ??C (Oral)  - Resp 18  - Wt (!) 106.7 kg (235 lb 3.2 oz)  - SpO2 100%  - BMI 28.35 kg/m??     Pt seen on the day of discharge and determined appropriate for discharge.    Condition at Discharge: good    Length of Discharge: I spent greater than 30 mins in the discharge of this patient.    I saw and evaluated the patient, participating in the key portions of the service on the day of discharge.  I reviewed the resident???s note and agree with the discharge plans and disposition. I personally spent >30 minutes in discharge planning services.     We reviewed with the patiet and his wife that there was an increased risk of progression of pulmonary infection and illness if he left the hospital at this time before we had all of the results back from the bronch.  ID recommended that he be discharged on anti-fungal coverage and linezolid.  He declined micafungin since no fungus had shown up on the bonrch yet.  He will take linezolid for MRSA pneumonia.    Gae Bon, MD

## 2020-07-21 NOTE — Unmapped (Signed)
Pt is currently getting chemo without any issues. Pt VSS, afebrile. No falls/events noted. Call bell in reach,

## 2020-07-21 NOTE — Unmapped (Shared)
Hematology Resident (MEDE) Progress Note    Assessment & Plan:   Adam 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 He presents with fevers and concerning findings for sepsis from a likely pulmonary source.    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 *      Fever c/f Sepsis from likely pulmonary source vs. Cytokine release syndrome  Patient febrile as of 4/15 with increased hoarseness, SOB, DOE, and increased sputum production who was found to be tachypneic and on CT chest had findings concerning for possible fungal pathogen. Other sources of infection include urine which is unlikely in setting of normal UA and bloodstream. Alternative etiologies include CRS though this would be unusual as patient is D27 of treatment with blinatumomab. Infectious studies from sputum negative for fungi or AFB but did grow MRSA though based on pt's clinical improvement without MRSA coverage suspect this represents contamination.   -S/p Daptomycin (4/15-4/16)  -Continue Cefepime  2g Q8 hours and Cresemba   -F/u BAL studies  -F/u MRSA screen  -Consult to Pulmonology    Ph+ ALL   Diagnosed in 01/2020 now in CR without flow MRD after GRAAPH induction and currently cycle 1 of Blinatumomab + ponatinib. Followed by [name].  -C1D28 Blinatumumab + Ponatinib  -After completion of Blinatumumab today will have 2 wks prior to next cycle.   - Continue ponatinib.  -Continue prophylactic daily valtrex,  Bactrim (2-3x/week), and ASA    DRESS??syndrome: ??Followed by Dr. Caryn Section with [facility]. S/p punch biopsy on 3/11. Onset proximate to dose adjustment of dasatinib, and administration of posaconazole and sotrovimab. Currently on prednisone taper: 40mg  daily??x 7??(3/25-3/31); 30mg  dalily x 7 (4/1- 4/7); 20mg  daily x 7 (4/8-4/14); 10 mg daily x 7 (4/15-4/21).  -Prednisone 10 mg daily  -PRN hydroxyzine??    Chronic Problems:  Tobacco use - Nicotine withdrawal  -Nicotine lozenge and gum   -Pt working on cessation with outpatient psychiatrist    MDD and anxiety with remote suicide attempt (age 17)   - continue nortriptyline 25mg    -Continue Klonopin to 0.5 mg nightly with additional 0.5mg  PRN for breakthrough daytime anxiety    Chronic back pain: Follows with [facility] palliative care.  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 Avoid lasix/loop diuretics, other ototoxic medications  Hypertension:  Continue Amlodipine and Coreg  CKD: Creatinine at baseline    Daily Checklist:  Diet: Regular Diet  DVT PPx: Contraindicated 2/2 Held in setting of upcoming procedure  Electrolytes: No Repletion Needed  Code Status: Full Code  Dispo: continue floor care    Team Contact Information:   Primary Team: Hematology Resident (MEDE)  Primary Resident: Chrys Racer, MD  Resident's Pager: [phone] (Hematology Intern - White)    Interval History:   No acute events overnight.  Patient feeling much better this morning though with increased anxiety and wants to leave the hospital after completion of blinatumumab.  All other systems  were reviewed and are negative except as noted in the HPI    Objective:   Temp:  [35.7 ??C (96.2 ??F)-37 ??C (98.6 ??F)] 36.7 ??C (98.1 ??F)  Heart Rate:  [71-111] 111  Resp:  [11-22] 18  BP: (102-170)/(52-105) 170/77  FiO2 (%):  [100 %] 100 %  SpO2:  [92 %-100 %] 98 %    Gen: WDWN in NAD, answers questions appropriately  HENT: atraumatic, MMM, OP w/o erythema or exudate   Heart: RRR, S1, S2, no M/R/G, no chest wall tenderness  Lungs: CTAB, no crackles or wheezes, no use of accessory muscles  Abdomen: Normoactive bowel sounds, soft, NTND, no rebound/guarding  Extremities: no clubbing, cyanosis, or edema in the BLEs  Skin: Erythematous patches of bilateral inner thighs  Psych: Alert, oriented, appropriate mood and affect    Labs/Studies: Labs and Studies from the last 24hrs per EMR and Reviewed    Note prepared by Chrys Racer, MS4      I attest that I have reviewed the student note and that the components of the history of the present illness, the physical exam, and the assessment and plan documented were performed by me or were performed in my presence by the student where I verified the documentation and performed (or re-performed) the exam and medical decision making.    Geoffry Paradise, MD PGY -1

## 2020-07-21 NOTE — Unmapped (Signed)
Pulmonary Non-Visit Note    Reviewed 24 hour events in chart.    Pertinent findings include: Patient tolerated BAL without incident, and continues maintaining saturations on room air.    Our recommendations are: Follow-up pending infectious studies.    We will sign off at this time     This patient was discussed, but not seen, with Dr. Rosanne Ashing, MD  Pulmonary and Critical Care Fellow    Thank you for allowing [place] to participate in this patient's care. Please do not hesitate to call the on-call pulmonary fellow at [phone] with any questions.

## 2020-07-21 NOTE — Unmapped (Signed)
As requested, I have contacted the patient to review with him the following:    ?? [name] was able to confirm that he was discharged earlier today after completing his most recent cycle of blinatumomab in the presence of fever.    ?? [name] expressed that he was extremely unsatisfied with his experience during this most recent hospitalization.    ?? The patient remains tentatively scheduled for repeat bone marrow biopsy on Thursday, July 23, 2020 and will follow-up with the outpatient Leukemia Care Team on Wednesday, July 29, 2020.    ?? [name] did express that should he develop recurrent fever or any other concerning symptoms he will reach out to the outpatient Leukemia Care Team by telephone or [facility] MyChart message to address any concerns.    ?? With regard to discharge and care plan for infection management, I was able to confirm the following:  ?? Linezolid 600 mg tablets, dispense 28 representing a 14-day supplies was obtained by the patient at time of discharge.    ?? Unfortunately, the prescription for Cresemba was not dispensed as written due to needing prior authorization.  ?? Therefore, the patient is not on any outpatient antifungal therapy at the present time.  ?? Should prior authorization be obtained, the patient is amenable to obtaining Cresemba from the [facility] upon returning to campus on Thursday, July 23, 2020.    ?? Moreover, [name] reports that when his steroid taper of prednisone is down to approximately 1 tablet daily, the rash he experienced in March and was evaluated by the dermatology team begins to represent.  ?? [name] did confirm that taking a dose of hydroxyzine in conjunction with the minimal dose of steroids will keep the rash at bay for approximately 6 hours or so.  ?? Follow-up with Dr. Caryn Section from the dermatology team may still be warranted at this time.    Otherwise, the patient expressed verbal understanding and had no other questions or concerns at this time.  He is in agreement with the plan of care outlined above and will contact the clinic back sooner should he have any other issues.    No other actions taken and this updates been shared with the Leukemia Care Team

## 2020-07-22 LAB — HISTOPLASMA ANTIGEN, URINE
HISTOPLASMA AG, URINE RESULT: NOT DETECTED
HISTOPLASMA AG, URINE VALUE: NOT DETECTED ng/mL

## 2020-07-22 NOTE — Unmapped (Signed)
[facility] Specialty Medication Onboarding    Specialty Medication: Cresemba 186mg  capsules  Prior Authorization: Approved   Financial Assistance: No - copay  <$25  Final Copay/Day Supply: $3 / 28 days    Insurance Restrictions: None     Notes to Pharmacist:     The triage team has completed the benefits investigation and has determined that the patient is able to fill this medication at [facility]. Please contact the patient to complete the onboarding or follow up with the prescribing physician as needed.

## 2020-07-22 NOTE — Unmapped (Signed)
Spoke With  Mr. Baquero about scheduling a CT scan for 5/2 at [facility].

## 2020-07-23 ENCOUNTER — Other Ambulatory Visit: Admit: 2020-07-23 | Discharge: 2020-07-24 | Payer: MEDICAID

## 2020-07-23 ENCOUNTER — Ambulatory Visit: Admit: 2020-07-23 | Discharge: 2020-07-24 | Payer: MEDICAID | Attending: Adult Health | Primary: Adult Health

## 2020-07-23 DIAGNOSIS — D801 Nonfamilial hypogammaglobulinemia: Principal | ICD-10-CM

## 2020-07-23 DIAGNOSIS — C9100 Acute lymphoblastic leukemia not having achieved remission: Principal | ICD-10-CM

## 2020-07-23 LAB — COMPREHENSIVE METABOLIC PANEL
ALBUMIN: 3.5 g/dL (ref 3.4–5.0)
ALKALINE PHOSPHATASE: 77 U/L (ref 46–116)
ALT (SGPT): 12 U/L (ref 10–49)
ANION GAP: 5 mmol/L (ref 5–14)
AST (SGOT): 11 U/L (ref <=34)
BILIRUBIN TOTAL: 0.2 mg/dL — ABNORMAL LOW (ref 0.3–1.2)
BLOOD UREA NITROGEN: 24 mg/dL — ABNORMAL HIGH (ref 9–23)
BUN / CREAT RATIO: 13
CALCIUM: 9.6 mg/dL (ref 8.7–10.4)
CHLORIDE: 109 mmol/L — ABNORMAL HIGH (ref 98–107)
CO2: 26 mmol/L (ref 20.0–31.0)
CREATININE: 1.85 mg/dL — ABNORMAL HIGH
EGFR CKD-EPI AA MALE: 52 mL/min/{1.73_m2} — ABNORMAL LOW (ref >=60)
EGFR CKD-EPI NON-AA MALE: 45 mL/min/{1.73_m2} — ABNORMAL LOW (ref >=60)
GLUCOSE RANDOM: 96 mg/dL (ref 70–179)
POTASSIUM: 4.4 mmol/L (ref 3.4–4.8)
PROTEIN TOTAL: 6.4 g/dL (ref 5.7–8.2)
SODIUM: 140 mmol/L (ref 135–145)

## 2020-07-23 LAB — CBC W/ AUTO DIFF
BASOPHILS ABSOLUTE COUNT: 0.1 10*9/L (ref 0.0–0.1)
BASOPHILS RELATIVE PERCENT: 0.7 %
EOSINOPHILS ABSOLUTE COUNT: 0.3 10*9/L (ref 0.0–0.5)
EOSINOPHILS RELATIVE PERCENT: 1.7 %
HEMATOCRIT: 31 % — ABNORMAL LOW (ref 39.0–48.0)
HEMOGLOBIN: 10.4 g/dL — ABNORMAL LOW (ref 12.9–16.5)
LYMPHOCYTES ABSOLUTE COUNT: 2.5 10*9/L (ref 1.1–3.6)
LYMPHOCYTES RELATIVE PERCENT: 13.5 %
MEAN CORPUSCULAR HEMOGLOBIN CONC: 33.6 g/dL (ref 32.0–36.0)
MEAN CORPUSCULAR HEMOGLOBIN: 30.9 pg (ref 25.9–32.4)
MEAN CORPUSCULAR VOLUME: 92 fL (ref 77.6–95.7)
MEAN PLATELET VOLUME: 7.5 fL (ref 6.8–10.7)
MONOCYTES ABSOLUTE COUNT: 1.1 10*9/L — ABNORMAL HIGH (ref 0.3–0.8)
MONOCYTES RELATIVE PERCENT: 5.7 %
NEUTROPHILS ABSOLUTE COUNT: 14.7 10*9/L — ABNORMAL HIGH (ref 1.8–7.8)
NEUTROPHILS RELATIVE PERCENT: 78.4 %
PLATELET COUNT: 227 10*9/L (ref 150–450)
RED BLOOD CELL COUNT: 3.37 10*12/L — ABNORMAL LOW (ref 4.26–5.60)
RED CELL DISTRIBUTION WIDTH: 18.6 % — ABNORMAL HIGH (ref 12.2–15.2)
WBC ADJUSTED: 18.8 10*9/L — ABNORMAL HIGH (ref 3.6–11.2)

## 2020-07-23 LAB — SLIDE REVIEW

## 2020-07-23 LAB — IGA: GAMMAGLOBULIN; IGA: <15 mg/dL — ABNORMAL LOW (ref 70.0–400.0)

## 2020-07-23 MED ORDER — ISAVUCONAZONIUM SULFATE 186 MG CAPSULE
ORAL_CAPSULE | Freq: Every day | ORAL | 2 refills | 28.00000 days | Status: CP
Start: 2020-07-23 — End: ?

## 2020-07-23 MED ADMIN — HYDROmorphone (DILAUDID) injection 0.6 mg: .6 mg | INTRAVENOUS | @ 18:00:00 | Stop: 2020-07-23

## 2020-07-23 MED ADMIN — heparin, porcine (PF) 100 unit/mL injection 500 Units: 500 [IU] | INTRAVENOUS | @ 18:00:00 | Stop: 2020-07-24

## 2020-07-23 MED FILL — CRESEMBA 186 MG CAPSULE: ORAL | 28 days supply | Qty: 56 | Fill #0

## 2020-07-23 NOTE — Unmapped (Signed)
Date of Service: 07/23/2020      Patient Active Problem List   Diagnosis   ??? Tobacco use disorder   ??? AKI (acute kidney injury) (CMS-HCC)   ??? Transaminitis   ??? 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 Hyperphosphatemia   ??? MRSA bacteremia   ??? Septic shock due to Staphylococcus aureus (CMS-HCC)   ??? Hyperkalemia   ??? Hiccups   ??? Hypernatremia   ??? Red blood cell antibody positive   ??? Hypokalemia   ??? Hypomagnesemia   ??? COVID-19   ??? Dyspnea   ??? Major depressive disorder, recurrent, moderate (CMS-HCC)   ??? PTSD (post-traumatic stress disorder)   ??? Anxiety disorder   ??? Insomnia   ??? Fever   ??? Immunocompromised (CMS-HCC)       Indication:    Diagnosis ICD-10-CM Associated Orders   1. 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)  C91.00 HYDROmorphone (DILAUDID) injection 0.6 mg     heparin, porcine (PF) 100 unit/mL injection 500 Units     Hematopathology Order     Hematopathology Order     Cytogenetics Cancer/FISH NON-BLOOD     Cytogenetics Cancer/FISH NON-BLOOD     B-ALL Minimal Residual Disease (MRD), Flow Cytometry, Bone Marrow     B-ALL Minimal Residual Disease (MRD), Flow Cytometry, Bone Marrow     BCR/ABL1 p190/p210 Bone Marrow     BCR/ABL1 p190/p210 Bone Marrow     DNA Extract and Hold     DNA Extract and Hold     DNA Extract and Hold     DNA Extract and Hold     BCR/ABL1 p190/p210 Bone Marrow     BCR/ABL1 p190/p210 Bone Marrow       Premedication: Dilaudid IV 0.6 mg once  Driver confirmed: yes    Ordering Provider: Berline Lopes, MD    Clinician(s) Performing Procedure: [name], AGNP        Bone Marrow Aspirate and Biopsy, right side    The procedure risks and alternatives of the procedure were explained to the patient.  The patient verbalized understanding and signed informed consent. After a time-out in which his patient identifiers were checked by 2 providers, the patient was laid in prone position on the table.   The  posterior superior iliac spine and iliac crest were cleaned, prepped and draped in the usual sterile fashion.     Anesthetic agent used: 2% plain lidocaine.      Utilizing a Ranfac needle, a bone marrow aspiration and biopsy was performed.  Specimen was sent for routine histopathologic stains and sectioning, flow cytometry, cytogenetics and molecular analysis.     A pressure dressing was applied to the biopsy site.  Patient tolerated the procedure well.  Hemostasis was confirmed upon discharge.     The patient was given verbal instructions for wound care, such as to keep the biopsy site dry, covered for 24 hours, and to call your physician for a temperature > 100.5.  Tylenol may be taken for discomfort.    Specimens Collected:  EDTA x 2  Heparin x 2  Core biopsy x 1

## 2020-07-23 NOTE — Unmapped (Signed)
Please leave dressing in place and keep it dry for 24 hrs before removing. You can resume normal activities tomorrow, but take things easy today.     You may take tylenol as needed for discomfort at the area where the biopsy was taken.   After receiving Dilaudid, please do not drive today.     If you have questions between 8am to 5 pm Monday through Friday please call [phone] and speak to the operator.      For emergencies, evenings or weekends, please call [phone] and ask for oncology fellow on call.     Reasons to call emergency line may include:   Fever of 100.5 or greater   Nausea and/or vomiting not relieved with nausea medicine   Diarrhea or constipation   Severe pain not relieved with usual pain regimen       Lab on 07/23/2020   Component Date Value Ref Range Status   ??? Blood Type 07/23/2020 O POS   Final   ??? Select Screen 3 07/23/2020 NEG   Final    Patient has a history of red cell antibodies   ??? Sodium 07/23/2020 140  135 - 145 mmol/L Final   ??? Potassium 07/23/2020 4.4  3.4 - 4.8 mmol/L Final   ??? Chloride 07/23/2020 109 (A) 98 - 107 mmol/L Final   ??? Anion Gap 07/23/2020 5  5 - 14 mmol/L Final   ??? CO2 07/23/2020 26.0  20.0 - 31.0 mmol/L Final   ??? BUN 07/23/2020 24 (A) 9 - 23 mg/dL Final   ??? Creatinine 07/23/2020 1.85 (A) 0.60 - 1.10 mg/dL Final   ??? BUN/Creatinine Ratio 07/23/2020 13   Final   ??? EGFR CKD-EPI Non-African American,* 07/23/2020 45 (A) >=60 mL/min/1.57m2 Final   ??? EGFR CKD-EPI African American, Male 07/23/2020 52 (A) >=60 mL/min/1.33m2 Final   ??? Glucose 07/23/2020 96  70 - 179 mg/dL Final   ??? Calcium 29/56/2130 9.6  8.7 - 10.4 mg/dL Final   ??? Albumin 86/57/8469 3.5  3.4 - 5.0 g/dL Final   ??? Total Protein 07/23/2020 6.4  5.7 - 8.2 g/dL Final   ??? Total Bilirubin 07/23/2020 0.2 (A) 0.3 - 1.2 mg/dL Final   ??? AST 62/95/2841 11  <=34 U/L Final   ??? ALT 07/23/2020 12  10 - 49 U/L Final   ??? Alkaline Phosphatase 07/23/2020 77  46 - 116 U/L Final   ??? WBC 07/23/2020 18.8 (A) 3.6 - 11.2 10*9/L Final   ??? RBC 07/23/2020 3.37 (A) 4.26 - 5.60 10*12/L Final   ??? HGB 07/23/2020 10.4 (A) 12.9 - 16.5 g/dL Final   ??? HCT 32/44/0102 31.0 (A) 39.0 - 48.0 % Final   ??? MCV 07/23/2020 92.0  77.6 - 95.7 fL Final   ??? MCH 07/23/2020 30.9  25.9 - 32.4 pg Final   ??? MCHC 07/23/2020 33.6  32.0 - 36.0 g/dL Final   ??? RDW 72/53/6644 18.6 (A) 12.2 - 15.2 % Final   ??? MPV 07/23/2020 7.5  6.8 - 10.7 fL Final   ??? Platelet 07/23/2020 227  150 - 450 10*9/L Final   ??? Neutrophils % 07/23/2020 78.4  % Final   ??? Lymphocytes % 07/23/2020 13.5  % Final   ??? Monocytes % 07/23/2020 5.7  % Final   ??? Eosinophils % 07/23/2020 1.7  % Final   ??? Basophils % 07/23/2020 0.7  % Final   ??? Absolute Neutrophils 07/23/2020 14.7 (A) 1.8 - 7.8 10*9/L Final   ???  Absolute Lymphocytes 07/23/2020 2.5  1.1 - 3.6 10*9/L Final   ??? Absolute Monocytes 07/23/2020 1.1 (A) 0.3 - 0.8 10*9/L Final   ??? Absolute Eosinophils 07/23/2020 0.3  0.0 - 0.5 10*9/L Final   ??? Absolute Basophils 07/23/2020 0.1  0.0 - 0.1 10*9/L Final   ??? Anisocytosis 07/23/2020 Slight (A) Not Present Final   ??? Smear Review Comments 07/23/2020 See Comment (A) Undefined Final    Slide reviewed. Myelocytes present- rare.    ??? Neutrophil Left Shift 07/23/2020 Present (A) Not Present Final

## 2020-07-23 NOTE — Unmapped (Signed)
Patient arrived for BMBX. Meds given as ordered, driver for patient confirmed. Patient tolerated procedure well and was discharged by APP.

## 2020-07-26 DIAGNOSIS — C9100 Acute lymphoblastic leukemia not having achieved remission: Principal | ICD-10-CM

## 2020-07-27 DIAGNOSIS — C9100 Acute lymphoblastic leukemia not having achieved remission: Principal | ICD-10-CM

## 2020-07-27 MED ORDER — GABAPENTIN 300 MG CAPSULE
ORAL_CAPSULE | Freq: Three times a day (TID) | ORAL | 0 refills | 30 days | Status: CP
Start: 2020-07-27 — End: 2020-08-26
  Filled 2020-07-29: qty 90, 30d supply, fill #0

## 2020-07-27 MED ORDER — ASPIRIN 81 MG CHEWABLE TABLET
ORAL_TABLET | Freq: Every day | ORAL | 11 refills | 36.00000 days | Status: CP
Start: 2020-07-27 — End: 2020-08-26
  Filled 2020-07-29: qty 36, 36d supply, fill #0

## 2020-07-27 NOTE — Unmapped (Signed)
Please refill if appropriate

## 2020-07-28 DIAGNOSIS — C9100 Acute lymphoblastic leukemia not having achieved remission: Principal | ICD-10-CM

## 2020-07-28 MED ORDER — ASPIRIN 81 MG CHEWABLE TABLET
ORAL_TABLET | Freq: Every day | ORAL | 0 refills | 36 days | Status: CN
Start: 2020-07-28 — End: 2020-08-27

## 2020-07-28 NOTE — Unmapped (Signed)
Addended by: [name] T on: 07/27/2020 06:14 PM     Modules accepted: Orders

## 2020-07-29 ENCOUNTER — Ambulatory Visit: Admit: 2020-07-29 | Discharge: 2020-07-30 | Payer: MEDICAID | Attending: Adult Health | Primary: Adult Health

## 2020-07-29 ENCOUNTER — Ambulatory Visit
Admit: 2020-07-29 | Discharge: 2020-07-30 | Payer: MEDICAID | Attending: Student in an Organized Health Care Education/Training Program | Primary: Student in an Organized Health Care Education/Training Program

## 2020-07-29 ENCOUNTER — Other Ambulatory Visit: Admit: 2020-07-29 | Discharge: 2020-07-30 | Payer: MEDICAID

## 2020-07-29 DIAGNOSIS — C9100 Acute lymphoblastic leukemia not having achieved remission: Principal | ICD-10-CM

## 2020-07-29 LAB — CBC W/ AUTO DIFF
BASOPHILS ABSOLUTE COUNT: 0.2 10*9/L — ABNORMAL HIGH (ref 0.0–0.1)
BASOPHILS RELATIVE PERCENT: 1.1 %
EOSINOPHILS ABSOLUTE COUNT: 0.4 10*9/L (ref 0.0–0.5)
EOSINOPHILS RELATIVE PERCENT: 2.3 %
HEMATOCRIT: 34.4 % — ABNORMAL LOW (ref 39.0–48.0)
HEMOGLOBIN: 12 g/dL — ABNORMAL LOW (ref 12.9–16.5)
LYMPHOCYTES ABSOLUTE COUNT: 2.2 10*9/L (ref 1.1–3.6)
LYMPHOCYTES RELATIVE PERCENT: 11.3 %
MEAN CORPUSCULAR HEMOGLOBIN CONC: 34.9 g/dL (ref 32.0–36.0)
MEAN CORPUSCULAR HEMOGLOBIN: 32 pg (ref 25.9–32.4)
MEAN CORPUSCULAR VOLUME: 91.7 fL (ref 77.6–95.7)
MEAN PLATELET VOLUME: 7.3 fL (ref 6.8–10.7)
MONOCYTES ABSOLUTE COUNT: 0.4 10*9/L (ref 0.3–0.8)
MONOCYTES RELATIVE PERCENT: 1.9 %
NEUTROPHILS ABSOLUTE COUNT: 15.9 10*9/L — ABNORMAL HIGH (ref 1.8–7.8)
NEUTROPHILS RELATIVE PERCENT: 83.4 %
PLATELET COUNT: 245 10*9/L (ref 150–450)
RED BLOOD CELL COUNT: 3.75 10*12/L — ABNORMAL LOW (ref 4.26–5.60)
RED CELL DISTRIBUTION WIDTH: 18.8 % — ABNORMAL HIGH (ref 12.2–15.2)
WBC ADJUSTED: 19.1 10*9/L — ABNORMAL HIGH (ref 3.6–11.2)

## 2020-07-29 LAB — COMPREHENSIVE METABOLIC PANEL
ALBUMIN: 4 g/dL (ref 3.4–5.0)
ALKALINE PHOSPHATASE: 80 U/L (ref 46–116)
ALT (SGPT): 17 U/L (ref 10–49)
ANION GAP: 8 mmol/L (ref 5–14)
AST (SGOT): 13 U/L (ref <=34)
BILIRUBIN TOTAL: 0.3 mg/dL (ref 0.3–1.2)
BLOOD UREA NITROGEN: 24 mg/dL — ABNORMAL HIGH (ref 9–23)
BUN / CREAT RATIO: 12
CALCIUM: 9.4 mg/dL (ref 8.7–10.4)
CHLORIDE: 109 mmol/L — ABNORMAL HIGH (ref 98–107)
CO2: 22 mmol/L (ref 20.0–31.0)
CREATININE: 2.08 mg/dL — ABNORMAL HIGH
EGFR CKD-EPI AA MALE: 45 mL/min/{1.73_m2} — ABNORMAL LOW (ref >=60)
EGFR CKD-EPI NON-AA MALE: 39 mL/min/{1.73_m2} — ABNORMAL LOW (ref >=60)
GLUCOSE RANDOM: 104 mg/dL (ref 70–179)
POTASSIUM: 4.4 mmol/L (ref 3.4–4.8)
PROTEIN TOTAL: 6.9 g/dL (ref 5.7–8.2)
SODIUM: 139 mmol/L (ref 135–145)

## 2020-07-29 MED ORDER — OXYCONTIN 15 MG TABLET,CRUSH RESISTANT,EXTENDED RELEASE
ORAL_TABLET | Freq: Every evening | ORAL | 0 refills | 28 days | Status: CP
Start: 2020-07-29 — End: ?

## 2020-07-29 MED ORDER — OXYCODONE ER 10 MG TABLET,CRUSH RESISTANT,EXTENDED RELEASE 12 HR
ORAL_TABLET | Freq: Three times a day (TID) | ORAL | 0 refills | 7 days | Status: CN | PRN
Start: 2020-07-29 — End: 2021-07-29

## 2020-07-29 MED ORDER — CLONAZEPAM 0.5 MG TABLET
ORAL_TABLET | Freq: Two times a day (BID) | ORAL | 0 refills | 14 days | Status: CP | PRN
Start: 2020-07-29 — End: ?

## 2020-07-29 MED FILL — SULFAMETHOXAZOLE 800 MG-TRIMETHOPRIM 160 MG TABLET: ORAL | 28 days supply | Qty: 16 | Fill #2

## 2020-07-29 MED FILL — AMLODIPINE 10 MG TABLET: ORAL | 30 days supply | Qty: 30 | Fill #0

## 2020-07-29 MED FILL — VALACYCLOVIR 500 MG TABLET: ORAL | 30 days supply | Qty: 30 | Fill #2

## 2020-07-29 NOTE — Unmapped (Unsigned)
Port accessed by [employer], labs drawn and sent. Port flushed, heparin-locked, and de-accessed.
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
## 2020-07-30 DIAGNOSIS — Z20822 COVID-19 ruled out by laboratory testing: Principal | ICD-10-CM

## 2020-07-30 DIAGNOSIS — C9100 Acute lymphoblastic leukemia not having achieved remission: Principal | ICD-10-CM

## 2020-07-30 NOTE — Unmapped (Cosign Needed)
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     Assessment/Plan:   1.  Pain: Combination of treatment related pain plus underlying history of chronic pain.  There was miscommunication about how to take his opioids at a previous discharge.  Had not been taking OxyContin scheduled.  Currently taking 15 mg OxyContin at night and one dose of 4 mg Dilaudid during the day. He states that he is in more pain than this but does not like to take more Dilaudid during the day because of him working with tools. He would prefer to use long-acting during the day. Reviewed plan that if he is consistently taking more than ~4 doses of Dilaudid a day that he would likely benefit from a long-acting opioid during the daytime. We also reviewed the ultimate goal of weaning from this medication if/when his cancer goes into remission.  -Continue nighttime OxyContin 15 mg PO (refill provided)  -Hydromorphone 2-4 mg PO q4h PRN; if taking more than ~4 doses per day, then consider daytime OxyContin 15 mg PO q12h; [name] (CPP) to call on 5/2 to assess this  -Continue gabapentin 300 mg PO TID; discussed increasing this today, but patient preferred not to    2.  Opioid-induced constipation: Some issues recently. Likely from opioids as well as possible side effects of cancer treatment.  Educated on goal of a bowel movement at least every 2 days.  Encouraged him to message [place] if not consistently having regular bowel movements.  -Senna 1 tablet every night    3. Anxiety: Seen by the psychiatry consult liaison service while in the hospital.  Following with [name].  Will defer management of psychotropics to her.  Again, patient previously misunderstood discharge instructions and was not been taking nortriptyline regularly.  Reviewed importance of taking nightly and that it takes several weeks to get into his system.  -Nortriptyline 25 mg nightly  -Continue Klonopin 0.5 mg BID as needed  -Given his description of intermittent feelings of physical restlessness associated with his anxiety, could consider propanolol 10 to 20 mg every 8 hours as needed    4.  Advance care planning: Did not address at this visit. General states that he can't wait to be done with all of the cancer treatments.      # Controlled substances risk management.   - Patient does not have a signed pain medication agreement with our team.   - [place] database was reviewed today and it was appropriate.   - Urine drug screen was not performed at this visit. Findings: not applicable.   - Patient has received information about safe storage and administration of medications.   - Patient has not received a prescription for narcan; is not applicable.       F/u: 4 weeks.  Clinical pharmacist to call 5/2 to check in.    ----------------------------------------  Referring Provider: Dr. Berline Lopes  Oncology Team: Malignant hematology team  PCP: [name], MD      HPI: 40 year old man with a diagnosis of B-ALL, diagnosed in October 2021.  Recently had an 8-day hospitalization, discharged on 3/26.    Current cancer-directed therapy: Blinatumomab (for MRD)    Interval History: Optimistic results regarding response to treatment. However, he will be continuing on blinatumomab-ponatinib, which he is quite frustrated/disappointed about. He felt the side effects from the first cycle were severe and is not looking forward  to another cycle. Dr. Malen Keith counseled him on that the second round is likely to be better tolerated. [name] is okay proceeding but is unhappy about it. Otherwise, we discussed his pain. Pain is typically in his back and lower extremities. This is a mix of treat-associated pain and chronic pain. Currently taking 15 mg OxyContin at night and one dose of 4 mg Dilaudid during the day. He states that he is in more pain than this but does not like to take more Dilaudid during the day because of him working with tools. He would prefer to use long-acting during the day. Reviewed plan that if he is consistently taking more than ~4 doses of Dilaudid a day that he would likely benefit from a long-acting opioid during the daytime. We also reviewed the ultimate goal of weaning from this medication if/when his cancer goes into remission. He is okay with this plan. He continues to sleep poorly. Unclear contribution of pain vs anxiety. Otherwise, he denies any other significant symptoms. He has nausea sometimes, but this responds to Zofran and Compazine. Denies constipation.    Symptom Review:  General: Disappointed about more treatment  Pain: See above  Fatigue: Stable  Mobility: Limited by pain  Sleep: Poor.  Feels like cancer medication makes him feel hopped up  Appetite: Stable  Nausea: As above  Bowel function: No issues reported  Dyspnea: None  Secretions: None  Mood: Anxiety continues    Palliative Performance Scale: 80% - Ambulation: Full / Normal Activity with effort, some evidence of disease / Self-Care:Full / Intake: Normal or reduced / Level of Conscious: Full    Coping/Support Issues: Some difficulty coping with his current illness.  However, well supported by his wife.    Goals of Care: Focused on treatment and cancer directed therapy.    Social History: Lives in [place] with his wife Adam Keith.  He was in the [employer]	[place].  Enjoys woodworking.  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      Advance Care Planning: Did not discuss.  We will follow-up at future visits.  HCPOA:  Natural surrogate decision maker:  Living Will:  ACP note:     Objective     Opioid Risk Tool:    Male  Male    Family history of substance abuse      Alcohol  1  3    Illegal drugs  2  3    Rx drugs  4  4    Personal history of substance abuse      Alcohol  3  3    Illegal drugs  4  4    Rx drugs  5  5    Age between 73???44 years  1  1    History of preadolescent sexual abuse  3  0    Psychological disease      ADD, OCD, bipolar, schizophrenia  2  2    Depression  1  1       Total: Moderate risk  (<3 low risk, 4-7 moderate risk, >8 high risk)    Oncology History Overview Note   Referring/Local Oncologist: None    Diagnosis:Ph+ ALL    Genetics:    Karyotype/FISH:Abnormal Karyotype: 46,XY,t(9;22)(q34;q11.2)[1]/45,XY,der(7;9)(q10;q10)t(9;22)(q34;q11.2),der(22)t(9;22)[11]/46,sdl,+der(22)t(9;22)[5]/46,XY[3]     Abnormal FISH: A BCR/ABL1 interphase FISH assay shows an abnormal signal pattern in 97% of the 100 cells scored. Of note, 2/97 abnormal cells have an additional BCR/ABL1 fusion signal from the der(22) chromosome, consistent with  the additional copy of the der(22) seen in clone 3 by G-banding.  The findings support a diagnosis of leukemia and have implications for targeted therapy and for monitoring residual disease.      Molecular Genetics:  BCR-ABL1 p210 transcripts were detected at a level of 46.479 IS% ratio in bone marrow.  BCR-ABL1 p190 transcripts were detected at a level of 4 in 100,000 cells in bone marrow.    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)     01/24/2020 - 04/02/2020 Chemotherapy    IP/OP LEUKEMIA GRAAPH-2005 + RITUXIMAB < 60 YO  rituximab hypercvad (odd and even course)     02/24/2020 Remission    CR with PCR MRD+; marrow showing 70% blasts with <1% blasts, negative flow MRD, BCR-ABL p210 PCR 0.197%; CNS negative for blasts     03/09/2020 Progression    CSF - rare blast ID'ed - IT chemo given    03/13/20 - IT chemo, CSF negative     04/16/2020 Biopsy    BM Bx with 40-50% cellularity, <1% blasts, MRD flow cytometry negative, BCR-ABL p210 transcripts 0.036%     05/28/2020 - 05/28/2020 Chemotherapy    OP AML - CNS THERAPY (INTRATHECAL CYTARABINE, INTRATHECAL METHOTREXATE, OR INTRATHECAL TRIPLE)  Select one of the following: cytarabine IT 100 mg with hydrocortisone 50 mg, cytarabine IT 40 mg with methotrexate 15 mg with hydrocortisone 50 mg, OR methotrexate IT 12 mg with hydrocortisone 50 mg     06/19/2020 -  Chemotherapy    IP/OP LEUKEMIA BLINATUMOMAB 7-DAY INFUSION (MINIMAL RESIDUAL DISEASE; WT >= 22 KG) (HOME INFUSION)      Cycles 1*-4: Blinatumomab 28 mcg/day Days 1-28 of 6-week cycle.  *Given in the inpatient setting on Days 1-3 on Cycle 1 and Days 1-2 on Cycle 2, while other treatment days are given in the outpatient setting.    Cycle 1 MRD Dosing     07/18/2020 Adverse Reaction    Hospitalization: fevers. Pneumonia     07/23/2020 Biopsy    Diagnosis  Bone marrow, right iliac, aspiration and biopsy  -   Normocellular bone marrow (50%) with trilineage hematopoiesis and 1% blasts by manual aspirate differential  -   Flow cytometry MRD analysis reveals no definitive immunophenotypic evidence of residual B lymphoblastic leukemia   - BCR-ABL p210 0.006%             Patient Active Problem List   Diagnosis   ??? Tobacco use disorder   ??? AKI (acute kidney injury) (CMS-HCC)   ??? Transaminitis   ??? 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 Hyperphosphatemia   ??? MRSA bacteremia   ??? Septic shock due to Staphylococcus aureus (CMS-HCC)   ??? Hyperkalemia   ??? Hiccups   ??? Hypernatremia   ??? Red blood cell antibody positive   ??? Hypokalemia   ??? Hypomagnesemia   ??? COVID-19   ??? Dyspnea   ??? Major depressive disorder, recurrent, moderate (CMS-HCC)   ??? PTSD (post-traumatic stress disorder)   ??? Anxiety disorder   ??? Insomnia   ??? Fever   ??? Immunocompromised (CMS-HCC)   ??? Hypogammaglobulinemia (CMS-HCC)       Past Medical History:   Diagnosis Date   ??? Red blood cell antibody positive 02/14/2020  Anti-E       Past Surgical History:   Procedure Laterality Date   ??? BONE MARROW BIOPSY & ASPIRATION  01/21/2020        ??? IR INSERT PORT AGE GREATER THAN 5 YRS  03/10/2020    IR INSERT PORT AGE GREATER THAN 5 YRS 03/10/2020 [name], MD IMG VIR [facility]   ??? PR BRONCHOSCOPY,DIAGNOSTIC W LAVAGE Bilateral 07/20/2020    Procedure: BRONCHOSCOPY, RIGID OR FLEXIBLE, INCLUDE FLUOROSCOPIC GUIDANCE WHEN PERFORMED; W/BRONCHIAL ALVEOLAR LAVAGE WITH MODERATE SEDATION;  Surgeon: Dellis Filbert, MD;  Location: BRONCH PROCEDURE LAB [facility];  Service: Pulmonary       Current Outpatient Medications   Medication Sig Dispense Refill   ??? acetaminophen (TYLENOL) 325 MG tablet Take 650 mg by mouth every six (6) hours as needed for pain.      ??? amLODIPine (NORVASC) 10 MG tablet Take 1 tablet (10 mg total) by mouth daily. 30 tablet 0   ??? aspirin 81 MG chewable tablet Chew 1 tablet (81 mg total) daily. 36 tablet 11   ??? carvediloL (COREG) 12.5 MG tablet TAKE 1 TABLET BY MOUTH TWO TIMES A DAY. 180 tablet 1   ??? clonazePAM (KLONOPIN) 0.5 MG tablet Take 1 tablet (0.5 mg total) by mouth two (2) times a day as needed for anxiety. 28 tablet 0   ??? gabapentin (NEURONTIN) 300 MG capsule Take 1 capsule (300 mg total) by mouth Three (3) times a day. 90 capsule 0   ??? hydrOXYzine (ATARAX) 25 MG tablet Take 25 mg by mouth Three (3) times a day.     ??? isavuconazonium sulfate (CRESEMBA) 186 mg cap capsule Take 2 capsules (372 mg total) by mouth daily. 56 capsule 2   ??? linezolid (ZYVOX) 600 mg tablet Take 1 tablet (600 mg total) by mouth every twelve (12) hours for 14 days. 28 tablet 0   ??? nortriptyline (PAMELOR) 25 MG capsule Take 1 capsule (25 mg total) by mouth nightly. Start after initial 7 days of nortriptyline 10mg . 30 capsule 0   ??? oxyCODONE (OXYCONTIN) 15 mg 12 hr crush resistant ER/CR tablet Take 1 tablet (15 mg total) by mouth nightly. 28 tablet 0   ??? oxyCODONE (OXYCONTIN) 30 mg TR12 12 hr crush resistant ER/CR tablet Take 1 tablet (30 mg total) by mouth every twelve (12) hours. (Patient not taking: Reported on 07/29/2020) 14 tablet 0   ??? PONATinib (ICLUSIG) 30 mg tablet Take 1 tablet (30 mg total) by mouth daily. Swallow tablets whole. Do not crush, break, cut or chew tablets. 30 tablet 5   ??? predniSONE (DELTASONE) 10 MG tablet Take 2 tablets (20 mg total) by mouth daily for 14 days, THEN 1 tablet (10 mg total) daily for 14 days. 42 tablet 0   ??? prochlorperazine (COMPAZINE) 10 MG tablet Take 1 tablet (10 mg total) by mouth every six (6) hours as needed for nausea (if no relief from zofran (ondansetron)). 60 tablet 1   ??? sulfamethoxazole-trimethoprim (BACTRIM DS) 800-160 mg per tablet Take 1 tablet (160 mg of trimethoprim total) by mouth 2 times a day on Saturday, Sunday. For prophylaxis while on chemo. 48 tablet 3   ??? valACYclovir (VALTREX) 500 MG tablet Take 1 tablet (500 mg total) by mouth daily. 90 tablet 3     No current facility-administered medications for this visit.       Allergies:   Allergies   Allergen Reactions   ??? Bupropion Hcl Other (See Comments)  Per patient out of touch with reality, suicidal, homicidal   ??? 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 Onion Anaphylaxis   ??? Vancomycin Analogues      Hearing loss with Lasix  Other reaction(s): Other (See Comments)  Hearing loss  lasix   ??? Bismuth Subsalicylate Nausea And Vomiting   ??? Ceftaroline Fosamil Rash and Other (See Comments)     Rash X 2 and pancytopenia 02/2020   ??? Furosemide      With Vancomycin caused hearing loss  Other reaction(s): Other (See Comments)  With Vancomycin caused hearing loss       Family History:  Cancer-related family history is negative for Melanoma.  He indicated that the status of his neg hx is unknown.      REVIEW OF SYSTEMS:  A comprehensive review of 14 systems was negative except for pertinent positives noted in HPI.      PHYSICAL EXAM:   Vital signs for this encounter: VS reviewed in EPIC.  GEN: Awake and alert; no acute distress but frustrated  PSYCH: Alert. Normal thought processes. Euthymic.  HEENT: Pupils equally round without scleral icterus. No eye drainage. No facial asymmetry.  CV: No JVD; pink and perfused extremities  LUNGS: No increased work of breathing or accessory muscle use.  ABD: Non-distended. No pain with movement.  SKIN: No rashes, petechiae or jaundice noted.  EXT: No effusions or deformities. No sarcopenia.  NEURO: Ambulatory. Grossly symmetric extremity movement. Memory intact. No seizures or myoclonus.    Lab Results   Component Value Date    CREATININE 2.08 (H) 07/29/2020     Lab Results   Component Value Date    ALKPHOS 80 07/29/2020    BILITOT 0.3 07/29/2020    BILIDIR 0.10 06/27/2020    PROT 6.9 07/29/2020    ALBUMIN 4.0 07/29/2020    ALT 17 07/29/2020    AST 13 07/29/2020            I personally spent 55 minutes face-to-face and non-face-to-face in the care of this patient, which includes all pre, intra, and post visit time on the date of service.     Remus Blake, MD  [facility] and Palliative Medicine Fellow

## 2020-07-30 NOTE — Unmapped (Addendum)
It was nice to see you in clinic. As we discussed, we recommend taking the fast-acting Dilaudid (2-4 mg, depending on your pain) as needed every 4 hours during the day in addition to your long-acting OxyContin at night so that we can get an idea of the best dose of long-acting medicine during the day. Our pharmacist will call you on Monday (5/2) to check on how this is going. Please do not hesitate to reach out with questions in the meantime.    ---    Here is some additional information about [facility] Outpatient Oncology Palliative Care:     What is Palliative Care?  Palliative Care is medical care that focuses on managing symptoms of cancer or side effects from cancer treatment. The Outpatient Oncology Palliative Care service uses a team approach to help patients and their caregivers with the goal of maintaining the best quality of life possible during a cancer diagnosis and cancer treatment.   The Outpatient Oncology Palliative Care team serves all outpatient oncology clinics including surgery, gynecology, medicine and radiation oncology.      Who provides Outpatient Palliative Care?  The Outpatient Oncology Palliative Care team includes doctors, nurses, pharmacists, social workers, counselors and nutritionists. The team works with a patient's primary oncology team to create care plans that can help manage symptoms related to cancer and cancer treatment.     What services are offered by Outpatient Oncology Palliative Care?  We can help cancer patients with:  ?? Pain  ?? Symptoms such as nausea, vomiting, anxiety, depression, fatigue, loss of appetite of shortness of breath  ?? Emotional, psychological, or spiritual suffering  ?? Nutritional problems  ?? Concern about medical decisions  ?? Difficulty communicating values, goals and personal choices.     Who are the Outpatient Oncology Palliative Care team members?  Physicians: Mickie Hillier, MD; Kathlynn Grate, MD; Doreatha Martin, MD  Physician Fellows:  Ulanda Edison, MD; Theodoro Clock, MD; Remus Blake, MD; Payton Doughty, MD; Flora Lipps, MD  Nurse Practitioner: Burtis Junes, FNP  Clinical Pharmacist Practitioner:   Nurse Clinical Coordinator: Allegra Lai, RN, BSN, MS, OCN  Administrative Specialist: Dolores Frame     When can I see the Outpatient Oncology Palliative Care team and how do I make contact in between visits?  We see patients Monday through Friday 8am - 4:30pm. If you have questions or concerns during clinic hours, please contact us. Any requests made outside of business hours will be addressed the following business day.      For appointments & questions Monday through Friday 8 AM??? 4:30 PM:  please call [phone] or Toll free [phone].     On Nights, Weekends and Holidays:  Call [phone] and ask for the Oncologist on call.

## 2020-07-30 NOTE — Unmapped (Signed)
Hello,    Please scheduled patient Adam Keith on Monday, Aug 10, 2020 for a SCHED ADM THROUGH INF at  11:00 AM     ?? Reason for Admission: Scheduled chemotherapy   ?? Primary Diagnosis:  Encounter for antineoplastic chemotherapy for [place] positive acute lymphoblastic leukemia  ?? Primary Diagnosis ICD-10 Code:  Z51.11 & C91.00  ?? Expected length of stay: 2 Days   ?? CPT Code:  16109   ?? CPT Code Description: Under Injection and Intravenous Infusion Chemotherapy and Other Highly Complex Drug or Highly Complex Biologic Agent Administration   ?? Treating Attending: Dr. Liliane Shi  ?? Need for PICC placement in Infusion? No    Infusion Scheduling: Please notify the patient of the appointment date and time once scheduled. Please document this information within this encounter and then route to the navigator prior to closing the encounter.       Scheduled Admissions Assessment        Assessment  Response  Intervention    Type of insurance  Medicare Financial counselor referral is not warranted this time.     Reliable Trasportation  Yes Refer patient back to [employer].   Central Line Access  Yes Patient has line access and does not require an intervention at this time     Other Pertinent Information:  Cycle 2 of blinatumomab and ponatinib (patient supplied)      ?? Given history of infection and low IgG level it has been recommended that a dose of IVIG be administered once every 6 weeks prior to the start of each cycle of blinatumomab.  ?? The infusion center visit for IVIG has been coordinated for Friday, Aug 07, 2020.    ?? IT chemotherapy for this cycle is tentatively coordinated for Friday, Aug 07, 2020 and already scheduled.    ?? Repeat COVID 19 surveillance testing is to be coordinated for Friday, Aug 07, 2020 prior to scheduled admission for cycle 2 of blinatumomab/ponatinib.    ?? Moreover, I have clarified that [name] continues to take a prednisone to manage DRESS as directed by Dr. Caryn Section from the dermatology team.  ?? Restarted prednisone 20 mg once daily for 14 days on 07/21/2020.  ?? On 08/05/2020, [name] will taper to prednisone 10 mg once daily for 14 days.  ?? The patient has been advised that should the rash or any other concerning symptoms present as the steroid dose is tapered down, he is to notify our office and/or Dr. Caryn Section immediately.    ?? Follow-up with the leukemia care team in the clinic setting is in the process of being tentatively coordinated around the 09/09/2020 timeframe.    Thank you,  Arvid Right

## 2020-07-30 NOTE — Unmapped (Signed)
BLINATUMOMAB ADMISSION HANDOFF     Primary Oncologist: [name]  Nurse Navigator: Colletta Maryland     Scheduled admit date: Monday, Aug 10, 2020     Indication:  [] MRD positive ALL  [x] Relapsed/refractory ALL     Central Line Access:  [x] Port  [] PICC  [] [facility]  [] Other:     Prior Auth:  [x]  Outpatient approved     Outpatient bag changes with:  [] [facility] [address] Susy Frizzle [place] - Main #: [phone])  [] [facility] (Main #: [phone])  [x] [facility] (Infusion charge #: [id number])     Labs needed: CBC w/ diff, CMP, Phos, Mg, CRP (per tx plan)  Lab frequency: Weekly w/ bag changes     Discharge needs:   [] Line care  [] Lodging ([facility], etc.)  [x] Appointments with [facility]  [] Other:

## 2020-07-30 NOTE — Unmapped (Signed)
I am arranging for IVIG to start on 5/6 when you are here for the IT therapy.    Plan for admission 5/9 - will be 2 nights if you tolerate well.    If you tolerate the start of this cycle well, then future cycles will be given completely outpatient.    No bone marrow biopsies will be planned until the full 5 cycles are completed.    There will be one IT each cycle.

## 2020-08-03 ENCOUNTER — Ambulatory Visit: Admit: 2020-08-03 | Discharge: 2020-08-04 | Payer: MEDICAID

## 2020-08-03 ENCOUNTER — Telehealth: Admit: 2020-08-03 | Discharge: 2020-08-04 | Payer: MEDICAID

## 2020-08-03 MED ORDER — NORTRIPTYLINE 50 MG CAPSULE
ORAL_CAPSULE | Freq: Every evening | ORAL | 11 refills | 30 days | Status: CP
Start: 2020-08-03 — End: 2021-08-03

## 2020-08-03 NOTE — Unmapped (Signed)
PALLIATIVE CARE PAIN FOLLOW-UP      Assessment/Plan:  1. Pain - Inadequately relieved by nightly OxyContin and daytime PRN Dilaudid  - INCREASE OxyContin from 15 mg nightly to 15 mg BID  - Continue hydromorphone (Dilaudid) to 2-4 mg q4h PRN depending on pain severity - Pt counseled to only use on PRN basis vs. Scheduled now that he is starting daytime scheduled long-acting pain medication       - If pain score 4-6/10, take 2 mg (1 tablet) q4h PRN       - If pain score 7-10/10, take 4 mg (2 tablets) q4h PRN  - Encouraged to take Tylenol 650 mg q6h PRN  - Continue gabapentin 300 mg TID    2. Constipation - currently having daily BMs  - Continue senna 1 tablet nightly    3. Anxiety - follows with psychiatry  - Continue nortriptyline 25 mg nightly  - Continue clonazepam to 0.5 mg nightly    F/u:   - ~1 week phone call with CPP to assess changes  - 6/1 Virtual visit with Dr. Genice Rouge  ----------------------------------------      Principal supportive care diagnosis: Adam [name] with Ph+ B-ALL. Called patient today via telephone to follow-up on pain management. At last visit with Drs. Lavin/Markwalter of 4/27, OxyContin 15 mg nightly was continued and pt was encouraged to utilize PRN Hydromorphone 2-4 mg q4h PRN to assess pain needs and consider addition of daytime dose of OxyContin.    Summary of pain medicine: OxyContin 15 mg at bedtime; Dilaudid 2-4 mg PO q4h PRN    Interval History 5/2: VR, Adam Keith PharmD + Adam Keith  Today, Adam Keith states he is doing okay; hanging in there. Pain located primarily in his back; combination of chronic pain and treatment-related pain. States that since his visit last Wednesday he has taken Dilaudid 2 mg q4h while awake consistently, and occasionally will take 4 mg dose if 2 mg dose does not adequately relieve pain. States that even with 4 mg dose, Dilaudid takes the edge off pain but doesn't completely take it away. Estimates he is taking on average 10 tablets (20 mg) PO Dilaudid daily. States he is sleeping okay at night; some nights he sleeps well but others he wakes up with pain and has trouble getting back to sleep.    Constipation: taking senna nightly; having daily BMs    Nausea/vomiting: Managed with Zofran/Compazine      Time spent with patient was 8 minutes.  Additional 20 minutes were spent on preparation, documentation and coordinating care.      Adam Keith, PharmD, CPP  Outpatient Oncology Palliative Care  Aug 03, 2020 4:40 PM

## 2020-08-03 NOTE — Unmapped (Signed)
So nice talking with you today. Here is our plan:    ?? Increase your nortriptyline from 25 mg to 50 mg at bedtime. I'm hopeful that this medicine will help with depression, anxiety, and sleep.  ?? Continue using the clonazepam 0.5 mg at bedtime. It will help you relax and sleep.    Please let me know if you have any questions or concerns.    Best,  [name]

## 2020-08-03 NOTE — Unmapped (Signed)
[facility]  Psychiatry   Established Patient E&M Service - Outpatient       Assessment:    Adam Keith presents for follow-up evaluation. He states that he has been tolerating the nortriptyline 25 mg well without any side effects and that he feels like it has helped his anxiety and it is starting to help his depression. Insomnia remains a problem, but patient continues to require Prednisone and continues to struggle with pain, which patient believes is interrupting his sleep. Increasing his nortriptyline from 25 mg to 50 mg to see if helps with depression and insomnia. He continues to take clonazepam 0.5 mg at bedtime to help with sleep and nighttime anxiety. Will reassess on 09/01/20.    Identifying Information:  Adam [name] with a history of PH+ B cell ALL, major depressive disorder, PTSD, anxiety, and insomnia. He has a long struggle with intermittent depressive episodes with one suicide attempt at age 66. He was recently hospitalized for chemotherapy and was started on nortriptyline and clonazepam to manage his symptoms. Recuperating from DRESS and currently on a prednisone taper.     Risk Assessment:  An assessment of suicide and violence risk factors was performed as part of this evaluation and is not significantly changed from the last visit.   While future psychiatric events cannot be accurately predicted, the patient does not currently require acute inpatient psychiatric care and does not currently meet [facility] involuntary commitment criteria.      Plan:    Problem 1: Major Depressive Disorder  Status of problem: chronic with mild to moderate exacerbation  Interventions:   ?? Increase nortriptyline from 25 mg to 50 mg at bedtime daily.   ?? Continue psychotherapy with local therapist  ??  Problem 2: Anxiety  Status of problem: improving  Interventions:   ?? See nortriptyline plan above  ?? OK to use hydroxyzine 25 mg for breakthrough anxiety  ?? OK to continue clonazepam 0.5 mg tablets.     Problem 3: Insomnia  Status of problem:  ongoing problem  Interventions:   ?? See nortriptyline and clonazepam plans above  ?? Will plan to educate re: sleep hygiene      Psychotherapy provided:  No billable psychotherapy service provided.    Patient has been given this writer's contact information as well as the [facility] Psychiatry urgent line number. The patient has been instructed to call 911 for emergencies.      Subjective:    Chief complaint:  Follow-up psychiatric evaluation for depression, anxiety, and insomnia    Interval History:   Anxiety improving and some signs that depression may also be improving. Ongoing pain issues contributing to insomnia along with continued steroid use. Patient tolerating the nortriptyline well without any side effects. Patient open to increasing the nortriptyline to see if it can help more with mood, anxiety, and sleep.     Prior Psychiatric Medication Trials: bupropion (could not tolerate), duloxetine, mirtazapine (given during recent hospitalization), lorazepam    Psychiatric Review of Systems  ?? Depressive Symptoms: depressed mood, difficulty concentrating, fatigue, feelings of worthlessness/guilt and insomnia  ?? Manic Symptoms: N/A  ?? Psychosis Symptoms: N/A  ?? Anxiety Symptoms: excessive anxiety and worry (improving), difficulty controlling worry (improving), easily fatigued, difficulty concentrating, irritability, muscle tension, difficulty falling asleep, difficulty staying asleep, restless, unsatisfying sleep   ?? Panic Symptoms: denies  ?? PTSD Symptoms: hypervigilance, avoidance, survivor's guilt, and intermittent truamatic memories  ?? Sleep disturbance: frequent night  time awakening, difficulty falling asleep and non-restful sleep   ?? Exercise: Unable to engage in a daily exercise routine.      Objective:      Mental Status Exam:  Appearance:    Appears stated age, Well nourished and Clean/Neat   Motor:   No abnormal movements   Speech/Language: Normal rate, volume, tone, fluency   Mood:   Depressed   Affect:   Calm, Cooperative, Depressed and Irritable   Thought process and Associations:   Logical, linear, clear, coherent, goal directed   Abnormal/psychotic thought content:     Denies SI, HI, self harm, delusions, obsessions, paranoid ideation, or ideas of reference   Perceptual disturbances:     Denies auditory and visual hallucinations, behavior not concerning for response to internal stimuli     Other:   insight and judgment intact     I personally spent 30 minutes face-to-face and non-face-to-face in the care of this patient, which includes all pre, intra, and post visit time on the date of service.    Visit was completed by video (or phone) and the appropriate disclaimer has been included below.    The patient reports they are currently: at home. I spent 15 minutes on the real-time audio and video with the patient on the date of service. I spent an additional 15 minutes on pre- and post-visit activities on the date of service.     The patient was physically located in [place] or a state in which I am permitted to provide care. The patient and/or parent/guardian understood that s/he may incur co-pays and cost sharing, and agreed to the telemedicine visit. The visit was reasonable and appropriate under the circumstances given the patient's presentation at the time.    The patient and/or parent/guardian has been advised of the potential risks and limitations of this mode of treatment (including, but not limited to, the absence of in-person examination) and has agreed to be treated using telemedicine. The patient's/patient's family's questions regarding telemedicine have been answered.     If the visit was completed in an ambulatory setting, the patient and/or parent/guardian has also been advised to contact their provider???s office for worsening conditions, and seek emergency medical treatment and/or call 911 if the patient deems either necessary.      Sheran Lawless, PMHNP  08/03/2020

## 2020-08-04 ENCOUNTER — Ambulatory Visit
Admit: 2020-08-04 | Discharge: 2020-08-05 | Payer: MEDICAID | Attending: Infectious Disease | Primary: Infectious Disease

## 2020-08-04 DIAGNOSIS — R911 Solitary pulmonary nodule: Principal | ICD-10-CM

## 2020-08-04 DIAGNOSIS — J15212 Pneumonia due to Methicillin resistant Staphylococcus aureus: Principal | ICD-10-CM

## 2020-08-04 MED ORDER — OXYCONTIN 15 MG TABLET,CRUSH RESISTANT,EXTENDED RELEASE
ORAL_TABLET | Freq: Two times a day (BID) | ORAL | 0 refills | 30 days | Status: CP
Start: 2020-08-04 — End: ?

## 2020-08-04 MED ORDER — XTAMPZA ER 13.5 MG CAPSULE SPRINKLE
Freq: Two times a day (BID) | ORAL | 0 refills | 0.00000 days | Status: CP
Start: 2020-08-04 — End: ?

## 2020-08-04 NOTE — Unmapped (Signed)
As requested, I have contacted the patient to review concerns that were addressed earlier today and a series of [place] MyChart messages specific to the following:    ?? Mr. Shinsato reports that he plans to buy an automated home blood pressure cuff and will track his blood pressures 3 times daily with the hope that the overall trends would be consistent with either decreasing or hopefully eliminating his antihypertensive therapy regimen.    ?? Towards the end of the 28-day cycle of blinatumomab/ponatinib that is tentatively set to start 08/10/2020, the patient would like to become involved in a routine involving strength training exercises.  ?? Mr. Marte has inquired if our clinical pharmacist, Florentina Addison could speak with him about potentially incorporating a protein supplement into his dietary regimen.    ?? In addition, the patient is amenable to speaking with a member of the oncology dietary/nutrition team with regards to his caloric intake and incorporating other suggestions that may be helpful.    ?? Follow-up with the Leukemia Care Team is tentatively set on 09/09/2020 and coincides with the end of cycle 2 of ponatinib/blinatumomab.    Otherwise, the patient expressed verbal understanding and had no other questions or concerns at this time.  He is in agreement with the plan outlined above and will contact the clinic back sooner should he have any other issues.    No other actions taken and this updates been shared with the Leukemia Care Team.

## 2020-08-04 NOTE — Unmapped (Signed)
Received word from patient's pharmacy that OxyContin required a prior authorization. Resent Rx for dose increase of 15 mg BID and started process to submit PA; called pharmacy to confirm insurance coverage, and they stated that OxyContin 15 mg qty 60 tabs for 30 days was approved thru insurance with no PA; copay $3.00.    Informed pt that prescription is filled, covered, and ready for pickup.    [name], PharmD, CPP  Outpatient Oncology Palliative Care  Aug 04, 2020 11:21 AM

## 2020-08-04 NOTE — Unmapped (Addendum)
IMMUNOCOMPROMISED HOST INFECTIOUS DISEASE PROGRESS NOTE    Assessment/Plan:     [name] who presents for follow up of pulmonary nodule and MRSA infection.     ID Problem List:  # B-cell ALL dx 01/21/20  - Cancer-related complications:??TLS, ARF,??hyperbilirubinemia,??prolonged??neutropenia??(<0.5) 10/21 - 10/29  - s/p [id number] with dasatinib; Induction D1 01/24/2020: vincristine, dexamethasone and dasatinib. Followed by cycle 1 ponatinib/blinatumomab. BMB after cycle 1 MRD negative with minimal BCR-ABL. Admit 5/9 for cycle 2 with IT triple therapy (rare blast on prior CSF).  -??Right IJ Port-A-Cath??placed 04/15/2020    Drug Intolerances/allergies  Ceftaroline - rash??and leukopenia  Dapsone - possible leukopenia  Vancomycin - probable ototoxicity (with furosemide)  Isavuconazole - elevated LFTs in the setting of TKI     Pertinent comorbidities  # DRESS  - 3/11 punch bx diagnosed. 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   - 4/17 legionella Uag negative, serum crypto ag negative, Serum galactomannan pending, serum CMV neg, urine histo ag pending, fungitell pending   - 4/18 bronch with BAL --> bact cx + MRSA, RPP negative, legionella culture negative, PJP DFA negative, galactomannan negative, CMV PCR negative; rest of studies (AFB cx, fungal cx) pending  - 5/2 CT chest Multifocal linear and nodular consolidative opacities which are overall decreased in size compared to prior chest CT 07/18/2020. There has been interval resolution of groundglass opacification surrounding these consolidative areas. Findings consistent with improving multifocal infection.  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  Recommendations:  Chest pain  - history concerning for unstable angina, particularly in current smoker vs. esophageal spasm in the setting of possible GERD due to recent steroid use  - advised patient to present to ED, however patient would like to stay home unless feeling unwell; notes he will present on 5/9 for scheduled admission, and if notes increased chest pain/SOB before that then will present earlier. Aware that his pain may be representative of coronary artery disease and manifest in myocardial infarction.     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    Follow up 1 month, or earlier inpatient. Discussed with Dr. [name].    Adam Heir, MD  Infectious Diseases Fellow, PGY [address] [place], [place]  08/04/2020 10:08 AM    Attending attestation  I saw and evaluated the patient, participating in the key portions of the service.  I reviewed the resident???s note.  I agree with the resident???s findings and plan.   Adam Hamburger, MD      Subjective     Interval History:    I reviewed and summarized the medical records from hospitalization on 4/15 - 4/19  for fever which illustrated admission for pneumonia. BAL and LRCx with MRSA. Started treatment for MRSA as well as for possible mold infection.     [name] reports feeling overall better. He has not noted recurrent fevers. He has a chronic productive cough in the setting of smoking. He notes his breathing is ok. He started isavuconazole the day after discharge (4/20) and has had no notable side effects. He was taking linezolid and noticed no adverse effects. He has had no diarrhea. He continues on steroids for DRESS, currently at 20 mg of prednisone daily; his rash is improving, however did recur on a faster taper. His is taking Bactrim and Valtrex for ppx. 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     He does notice new onset chest pain that occurs randomly throughout the day, at least daily, lasts about 10 minutes, and does not radiate. The pain occurs both upon exertion and at rest without association with any activities, though at times it occurs after eating.  He does notice associated shortness of breath. It began 3 weeks ago. Per his wife, his legs are usually swollen by the end of the day and improve overnight.     He notes no pain or swelling at his port site.     Medications:  Antimicrobials: Isa, linezolid until yesterday, Bactrim, Valtrex  Prior/Current immunomodulators: Prednisone, blina/ponatinib to start in a few days  Other medications reviewed.    Objective     Vital signs:  BP 152/106  - Pulse 83  - Temp 36.2 ??C (97.2 ??F) (Temporal)  - Resp 18  - Wt (!) 111.4 kg (245 lb 11.2 oz)  - SpO2 100%  - BMI 29.61 kg/m??     Physical Exam:  GEN:  looks well, no apparent distress  EYES: sclerae anicteric and non injected  ENT: MMM  CV:RRR, no abnormal heart sounds noted and no peripheral edema; no chest wall tenderness  PULM:Decreased air movement throughout, normal work of breathing at rest, CTAB anteriorly and CTAB posteriorly  FA:OZHY, NTND  QM:VHQIONGE  RECTAL:deferred  SKIN:no petechiae, ecchymoses or obvious rashes on clothed exam  MSK:no swollen joints  NEURO:no tremor noted, facial expression symmetric and moves extremities equally  PSYCH:attentive, appropriate affect, good eye contact, fluent speech    Labs:  Lab Results   Component Value Date    WBC 19.1 (H) 07/29/2020    WBC 18.8 (H) 07/23/2020    WBC 18.8 (H) 07/21/2020    HGB 12.0 (L) 07/29/2020    Hemoglobin 6.5 (L) 01/31/2020    HCT 34.4 (L) 07/29/2020    Platelet 245 07/29/2020    Absolute Neutrophils 15.9 (H) 07/29/2020  Absolute Lymphocytes 2.2 07/29/2020    Absolute Eosinophils 0.4 07/29/2020    Sodium 139 07/29/2020    Sodium Whole Blood 140 01/31/2020    Potassium 4.4 07/29/2020    Potassium, Bld 4.2 01/31/2020    BUN 24 (H) 07/29/2020    Creatinine 2.08 (H) 07/29/2020    Creatinine 1.85 (H) 07/23/2020    Creatinine 1.97 (H) 07/21/2020    Glucose 104 07/29/2020    Magnesium 1.9 07/21/2020    Albumin 4.0 07/29/2020    Total Bilirubin 0.3 07/29/2020    AST 13 07/29/2020    ALT 17 07/29/2020    Alkaline Phosphatase 80 07/29/2020    INR 0.97 04/15/2020    Sed Rate 30 (H) 07/18/2020    CRP 82.0 (H) 07/21/2020    CRP 210.0 (H) 07/20/2020       Microbiology:  Past cultures were reviewed in Epic and CareEverywhere.    Imaging:  Independent visualization of images: I independently reviewed the CT chest without contrast from 5/2 and I agree with the findings/interpretation.

## 2020-08-05 NOTE — Unmapped (Signed)
Addended by: Colletta Maryland A on: 08/05/2020 01:46 PM     Modules accepted: Orders

## 2020-08-05 NOTE — Unmapped (Signed)
[facility] Clinical Assessment & Refill Coordination Note    Adam Keith, DOB: [birth year]  Phone: [phone] (home)     All above HIPAA information was verified with patient's family member, [name].     Was a Nurse, learning disability used for this call? No    Specialty Medication(s):   Hematology/Oncology: Iclusig     Current Outpatient Medications   Medication Sig Dispense Refill   ??? acetaminophen (TYLENOL) 325 MG tablet Take 650 mg by mouth every six (6) hours as needed for pain.      ??? amLODIPine (NORVASC) 10 MG tablet Take 1 tablet (10 mg total) by mouth daily. 30 tablet 0   ??? aspirin 81 MG chewable tablet Chew 1 tablet (81 mg total) daily. 36 tablet 11   ??? carvediloL (COREG) 12.5 MG tablet TAKE 1 TABLET BY MOUTH TWO TIMES A DAY. 180 tablet 1   ??? clonazePAM (KLONOPIN) 0.5 MG tablet Take 1 tablet (0.5 mg total) by mouth two (2) times a day as needed for anxiety. 28 tablet 0   ??? gabapentin (NEURONTIN) 300 MG capsule Take 1 capsule (300 mg total) by mouth Three (3) times a day. 90 capsule 0   ??? hydrOXYzine (ATARAX) 25 MG tablet Take 25 mg by mouth Three (3) times a day.     ??? isavuconazonium sulfate (CRESEMBA) 186 mg cap capsule Take 2 capsules (372 mg total) by mouth daily. 56 capsule 2   ??? nortriptyline (PAMELOR) 50 MG capsule Take 1 capsule (50 mg total) by mouth nightly. 30 capsule 11   ??? oxyCODONE (OXYCONTIN) 15 mg 12 hr crush resistant ER/CR tablet Take 1 tablet (15 mg total) by mouth two (2) times a day. 60 tablet 0   ??? oxyCODONE myristate (XTAMPZA ER) 13.5 mg CSpT Take 13.5 mg by mouth every twelve (12) hours. 60 each 0   ??? PONATinib (ICLUSIG) 30 mg tablet Take 1 tablet (30 mg total) by mouth daily. Swallow tablets whole. Do not crush, break, cut or chew tablets. 30 tablet 5   ??? predniSONE (DELTASONE) 10 MG tablet Take 2 tablets (20 mg total) by mouth daily for 14 days, THEN 1 tablet (10 mg total) daily for 14 days. 42 tablet 0   ??? prochlorperazine (COMPAZINE) 10 MG tablet Take 1 tablet (10 mg total) by mouth every six (6) hours as needed for nausea (if no relief from zofran (ondansetron)). 60 tablet 1   ??? sulfamethoxazole-trimethoprim (BACTRIM DS) 800-160 mg per tablet Take 1 tablet (160 mg of trimethoprim total) by mouth 2 times a day on Saturday, Sunday. For prophylaxis while on chemo. 48 tablet 3   ??? valACYclovir (VALTREX) 500 MG tablet Take 1 tablet (500 mg total) by mouth daily. 90 tablet 3     No current facility-administered medications for this visit.        Changes to medications: Arron reports no changes at this time.    Allergies   Allergen Reactions   ??? Bupropion Hcl Other (See Comments)     Per patient out of touch with reality, suicidal, homicidal   ??? 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 Onion Anaphylaxis   ??? Vancomycin Analogues      Hearing loss with Lasix  Other reaction(s): Other (See Comments)  Hearing loss  lasix   ??? Bismuth Subsalicylate Nausea And Vomiting   ??? Ceftaroline Fosamil Rash and Other (See Comments)     Rash X  2 and pancytopenia 02/2020   ??? Furosemide      With Vancomycin caused hearing loss  Other reaction(s): Other (See Comments)  With Vancomycin caused hearing loss       Changes to allergies: No    SPECIALTY MEDICATION ADHERENCE     Iclusig 30 mg: 10 days of medicine on hand     Medication Adherence    Patient reported X missed doses in the last month: 0  Specialty Medication: Iclusig 30 mg one daily  Patient is on additional specialty medications: No  Informant: patient  Confirmed plan for next specialty medication refill: delivery by pharmacy          Specialty medication(s) dose(s) confirmed: Regimen is correct and unchanged.     Are there any concerns with adherence? No    Adherence counseling provided? Not needed    CLINICAL MANAGEMENT AND INTERVENTION      Clinical Benefit Assessment:    Do you feel the medicine is effective or helping your condition? Yes    Clinical Benefit counseling provided? Not needed    Adverse Effects Assessment:    Are you experiencing any side effects? No    Are you experiencing difficulty administering your medicine? No    Quality of Life Assessment:    How many days over the past month did your ALL in remission  keep you from your normal activities? For example, brushing your teeth or getting up in the morning. 0    Have you discussed this with your provider? Not needed    Acute Infection Status:    Acute infections noted within Epic:  MRSA  Patient reported infection: None    Therapy Appropriateness:    Is therapy appropriate? Yes, therapy is appropriate and should be continued    DISEASE/MEDICATION-SPECIFIC INFORMATION      N/A    PATIENT SPECIFIC NEEDS     - Does the patient have any physical, cognitive, or cultural barriers? No    - Is the patient high risk? Yes, patient is taking oral chemotherapy. Appropriateness of therapy as been assessed    - Does the patient require a Care Management Plan? No     - Does the patient require physician intervention or other additional services (i.e. nutrition, smoking cessation, social work)? No      SHIPPING     Specialty Medication(s) to be Shipped:   Hematology/Oncology: Iclusig    Other medication(s) to be shipped: No additional medications requested for fill at this time     Changes to insurance: No    Delivery Scheduled: Yes, Expected medication delivery date: 08/12/20.     Medication will be delivered via UPS to the confirmed prescription address in [facility].    The patient will receive a drug information handout for each medication shipped and additional FDA Medication Guides as required.  Verified that patient has previously received a Conservation officer, historic buildings and a Surveyor, mining.    All of the patient's questions and concerns have been addressed.    Breck Coons [facility] Specialty Pharmacist

## 2020-08-05 NOTE — Unmapped (Signed)
I called Mr. Kugel back regarding a question forwarded to me about protein supplements. Mr. Wild states that he's working on getting his muscle mass back and re-bulking up following weakness attributed to long hospital stays and chemotherapy. I explained that while I agree getting strength back is important, I'd rather him focus on good well rounded nutrition instead of these sort of protein supplements which are normally designed for folks who are doing aggressive strength training to bulk up. These products sometimes include other ingredients and I'd rather not confound anything while he's actively on chemotherapy (blina/ponatinib). I recommended if he wanted something that has good protein in it he can look into Boost or Ensure shakes, and they even have low sugar options like Glucerna (since he's focused on macros). We will also try to get a referral in for nutrition to see him. He agreed to not take any of these supplements for now until seen next to discuss further with [name].      Manfred Arch, PharmD, BCOP, CPP  Pager: [phone]

## 2020-08-06 DIAGNOSIS — C9100 Acute lymphoblastic leukemia not having achieved remission: Principal | ICD-10-CM

## 2020-08-06 NOTE — Unmapped (Signed)
Addended by: Darlen Round on: 08/05/2020 06:45 PM     Modules accepted: Level of Service

## 2020-08-07 ENCOUNTER — Ambulatory Visit: Admit: 2020-08-07 | Discharge: 2020-08-08 | Payer: MEDICAID | Attending: Registered" | Primary: Registered"

## 2020-08-07 ENCOUNTER — Ambulatory Visit: Admit: 2020-08-07 | Discharge: 2020-08-08 | Payer: MEDICAID

## 2020-08-07 ENCOUNTER — Ambulatory Visit: Admit: 2020-08-07 | Discharge: 2020-08-08 | Payer: MEDICAID | Attending: Adult Health | Primary: Adult Health

## 2020-08-07 ENCOUNTER — Other Ambulatory Visit: Admit: 2020-08-07 | Discharge: 2020-08-08 | Payer: MEDICAID

## 2020-08-07 DIAGNOSIS — C9100 Acute lymphoblastic leukemia not having achieved remission: Principal | ICD-10-CM

## 2020-08-07 LAB — CBC W/ AUTO DIFF
BASOPHILS ABSOLUTE COUNT: 0.2 10*9/L — ABNORMAL HIGH (ref 0.0–0.1)
BASOPHILS RELATIVE PERCENT: 1.3 %
EOSINOPHILS ABSOLUTE COUNT: 0.5 10*9/L (ref 0.0–0.5)
EOSINOPHILS RELATIVE PERCENT: 3.8 %
HEMATOCRIT: 34.9 % — ABNORMAL LOW (ref 39.0–48.0)
HEMOGLOBIN: 12 g/dL — ABNORMAL LOW (ref 12.9–16.5)
LYMPHOCYTES ABSOLUTE COUNT: 2.3 10*9/L (ref 1.1–3.6)
LYMPHOCYTES RELATIVE PERCENT: 18.4 %
MEAN CORPUSCULAR HEMOGLOBIN CONC: 34.5 g/dL (ref 32.0–36.0)
MEAN CORPUSCULAR HEMOGLOBIN: 31.9 pg (ref 25.9–32.4)
MEAN CORPUSCULAR VOLUME: 92.7 fL (ref 77.6–95.7)
MEAN PLATELET VOLUME: 7.4 fL (ref 6.8–10.7)
MONOCYTES ABSOLUTE COUNT: 0.8 10*9/L (ref 0.3–0.8)
MONOCYTES RELATIVE PERCENT: 6.8 %
NEUTROPHILS ABSOLUTE COUNT: 8.7 10*9/L — ABNORMAL HIGH (ref 1.8–7.8)
NEUTROPHILS RELATIVE PERCENT: 69.7 %
PLATELET COUNT: 213 10*9/L (ref 150–450)
RED BLOOD CELL COUNT: 3.76 10*12/L — ABNORMAL LOW (ref 4.26–5.60)
RED CELL DISTRIBUTION WIDTH: 19 % — ABNORMAL HIGH (ref 12.2–15.2)
WBC ADJUSTED: 12.5 10*9/L — ABNORMAL HIGH (ref 3.6–11.2)

## 2020-08-07 LAB — SLIDE REVIEW

## 2020-08-07 MED ADMIN — cytarabine (PF) (ARA-C) 40 mg, methotrexate (Preservative Free) 15 mg, hydrocortisone sod succ (Solu-CORTEF) 50 mg in sodium chloride (NS) 0.9 % 4 mL INTRATHECAL syringe: INTRATHECAL | @ 17:00:00 | Stop: 2020-08-07

## 2020-08-07 MED ADMIN — immune globulin, IgA less than 1 microgram/mL (GAMMAGARD S/D) 5% intravenous solution 40 g: 40 g | INTRAVENOUS | @ 20:00:00 | Stop: 2020-08-07

## 2020-08-07 MED ADMIN — acetaminophen (TYLENOL) tablet 650 mg: 650 mg | ORAL | @ 19:00:00 | Stop: 2020-08-07

## 2020-08-07 MED ADMIN — heparin, porcine (PF) 100 unit/mL injection 500 Units: 500 [IU] | INTRAVENOUS | @ 23:00:00 | Stop: 2020-08-08

## 2020-08-07 MED ADMIN — HYDROmorphone (DILAUDID) injection 0.6 mg: .6 mg | INTRAVENOUS | @ 17:00:00 | Stop: 2020-08-07

## 2020-08-07 MED ADMIN — sodium chloride (NS) 0.9 % infusion: 20 mL/h | INTRAVENOUS | @ 19:00:00 | Stop: 2020-08-07

## 2020-08-07 MED ADMIN — diphenhydrAMINE (BENADRYL) capsule/tablet 25 mg: 25 mg | ORAL | @ 19:00:00 | Stop: 2020-08-07

## 2020-08-07 NOTE — Unmapped (Signed)
Lumbar Puncture Procedure Note  Name: Adam Keith  Date: 08/07/2020  MRN: [id number]  DOB: 1980-11-08    Diagnosis:    Diagnosis ICD-10-CM Associated Orders   1. 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  Contraindications to performing lumbar puncture: none  ??  Imaging prior to lumbar puncture: Generally, imaging should be obtained prior to performing a lumbar puncture if the patient is > 22 years old, immunocompromised, has had a seizure within one week of presentation, has an abnormal level of consciousness, or an abnormal neurologic exam (including papilledema).   ??  Antiplatelet Agents: This patient is not on an antiplatelet agent.  ??  Systemic Anticoagulation: This patient is not on full systemic anticoagulation.  ??  Significant Labs:  Plt =  213k      ??  Consent: Informed consent was obtained. Risks of the procedure were discussed including: infection, bleeding, pain and headache.    A time-out in which His patient identifiers were checked by 2 providers was performed.    Procedure Details   ??  The patient was positioned under sterile conditions. Betadine solution and sterile drapes were utilized. Local anesthesia with 1% lidocaine was applied subcutaneously then deep to the skin. A spinal needle was inserted at the L3 - L4 interspace. Spinal fluid was obtained and sent to the laboratory.    Intrathecal triple therapy was administered over 5 min.    The spinal needle with trocar was removed with minimal bleeding noted upon removal. A sterile bandage was placed over the puncture site after holding pressure.      Findings  6mL of clear spinal fluid was obtained.    Complications:  None; patient tolerated the procedure well.          Condition: stable    Plan  Bed rest for 1 hour.

## 2020-08-07 NOTE — Unmapped (Unsigned)
Port accessed and labs drawn with no complications by [employer].  Port flushed with saline.

## 2020-08-07 NOTE — Unmapped (Signed)
As requested, I have contacted the patient to review/clarify the following:    ?? For reasons that are not clear, Adam Keith did not receive a MyChart notification highlighting an appointment for repeat COVID-19 testing at the [facility] earlier today as previously planned.    ?? Therefore, the patient has been advised to notify the inpatient care team that repeat COVID-19 surveillance testing was not completed and that if it is still warranted, can be completed during his admission.    Otherwise, the patient expressed verbal understanding and had no other questions or concerns at this time.  He is in agreement with the plan outlined above and will contact clinic back sooner should he have any other issues.    No other actions taken this updates been shared with the Leukemia Care Team.

## 2020-08-07 NOTE — Unmapped (Signed)
Pt in unit for IT chemo per orders; pt in bed, NP at bedside. IVP dilaudid given per orders. Time out completed, consent witnessed.

## 2020-08-08 NOTE — Unmapped (Signed)
Pt in unit for IT chemo and IVIG per orders; pt resting in bed, call light in reach. Orders reviewed. Per pharmacy, dose of IVIG needs to be changed; NP reviewed and new orders released. IVIG given per orders; rates and VS recorded in epic. Pt left ambulatory.

## 2020-08-10 ENCOUNTER — Other Ambulatory Visit: Admit: 2020-08-10 | Discharge: 2020-08-12 | Disposition: A | Discharge status: Home / Self Care | Payer: MEDICAID

## 2020-08-10 ENCOUNTER — Ambulatory Visit: Admit: 2020-08-10 | Discharge: 2020-08-12 | Disposition: A | Discharge status: Home / Self Care | Payer: MEDICAID

## 2020-08-10 DIAGNOSIS — C9100 Acute lymphoblastic leukemia not having achieved remission: Principal | ICD-10-CM

## 2020-08-10 LAB — BASIC METABOLIC PANEL
ANION GAP: 6 mmol/L (ref 5–14)
BLOOD UREA NITROGEN: 30 mg/dL — ABNORMAL HIGH (ref 9–23)
BUN / CREAT RATIO: 12
CALCIUM: 9.9 mg/dL (ref 8.7–10.4)
CHLORIDE: 106 mmol/L (ref 98–107)
CO2: 25 mmol/L (ref 20.0–31.0)
CREATININE: 2.52 mg/dL — ABNORMAL HIGH
EGFR CKD-EPI AA MALE: 35 mL/min/{1.73_m2} — ABNORMAL LOW (ref >=60)
EGFR CKD-EPI NON-AA MALE: 30 mL/min/{1.73_m2} — ABNORMAL LOW (ref >=60)
GLUCOSE RANDOM: 109 mg/dL (ref 70–179)
POTASSIUM: 4.8 mmol/L (ref 3.4–4.8)
SODIUM: 137 mmol/L (ref 135–145)

## 2020-08-10 LAB — CBC W/ AUTO DIFF
BASOPHILS ABSOLUTE COUNT: 0.1 10*9/L (ref 0.0–0.1)
BASOPHILS ABSOLUTE COUNT: 0.1 10*9/L (ref 0.0–0.1)
BASOPHILS RELATIVE PERCENT: 1.2 %
BASOPHILS RELATIVE PERCENT: 1.1 %
EOSINOPHILS ABSOLUTE COUNT: 0.4 10*9/L (ref 0.0–0.5)
EOSINOPHILS ABSOLUTE COUNT: 0.3 10*9/L (ref 0.0–0.5)
EOSINOPHILS RELATIVE PERCENT: 3.5 %
EOSINOPHILS RELATIVE PERCENT: 2.5 %
HEMATOCRIT: 36.1 % — ABNORMAL LOW (ref 39.0–48.0)
HEMATOCRIT: 34.5 % — ABNORMAL LOW (ref 39.0–48.0)
HEMOGLOBIN: 12.7 g/dL — ABNORMAL LOW (ref 12.9–16.5)
HEMOGLOBIN: 12.4 g/dL — ABNORMAL LOW (ref 12.9–16.5)
LYMPHOCYTES ABSOLUTE COUNT: 2.1 10*9/L (ref 1.1–3.6)
LYMPHOCYTES ABSOLUTE COUNT: 1.3 10*9/L (ref 1.1–3.6)
LYMPHOCYTES RELATIVE PERCENT: 16.9 %
LYMPHOCYTES RELATIVE PERCENT: 10.9 %
MEAN CORPUSCULAR HEMOGLOBIN CONC: 35.2 g/dL (ref 32.0–36.0)
MEAN CORPUSCULAR HEMOGLOBIN CONC: 36.1 g/dL — ABNORMAL HIGH (ref 32.0–36.0)
MEAN CORPUSCULAR HEMOGLOBIN: 32.3 pg (ref 25.9–32.4)
MEAN CORPUSCULAR HEMOGLOBIN: 32.9 pg — ABNORMAL HIGH (ref 25.9–32.4)
MEAN CORPUSCULAR VOLUME: 92 fL (ref 77.6–95.7)
MEAN CORPUSCULAR VOLUME: 91.1 fL (ref 77.6–95.7)
MEAN PLATELET VOLUME: 7.7 fL (ref 6.8–10.7)
MEAN PLATELET VOLUME: 7.4 fL (ref 6.8–10.7)
MONOCYTES ABSOLUTE COUNT: 0.2 10*9/L — ABNORMAL LOW (ref 0.3–0.8)
MONOCYTES ABSOLUTE COUNT: 0.1 10*9/L — ABNORMAL LOW (ref 0.3–0.8)
MONOCYTES RELATIVE PERCENT: 1.3 %
MONOCYTES RELATIVE PERCENT: 1.1 %
NEUTROPHILS ABSOLUTE COUNT: 9.6 10*9/L — ABNORMAL HIGH (ref 1.8–7.8)
NEUTROPHILS ABSOLUTE COUNT: 9.7 10*9/L — ABNORMAL HIGH (ref 1.8–7.8)
NEUTROPHILS RELATIVE PERCENT: 77.1 %
NEUTROPHILS RELATIVE PERCENT: 84.4 %
PLATELET COUNT: 199 10*9/L (ref 150–450)
PLATELET COUNT: 206 10*9/L (ref 150–450)
RED BLOOD CELL COUNT: 3.92 10*12/L — ABNORMAL LOW (ref 4.26–5.60)
RED BLOOD CELL COUNT: 3.79 10*12/L — ABNORMAL LOW (ref 4.26–5.60)
RED CELL DISTRIBUTION WIDTH: 18.6 % — ABNORMAL HIGH (ref 12.2–15.2)
RED CELL DISTRIBUTION WIDTH: 18.6 % — ABNORMAL HIGH (ref 12.2–15.2)
WBC ADJUSTED: 12.4 10*9/L — ABNORMAL HIGH (ref 3.6–11.2)
WBC ADJUSTED: 11.5 10*9/L — ABNORMAL HIGH (ref 3.6–11.2)

## 2020-08-10 LAB — COMPREHENSIVE METABOLIC PANEL
ALBUMIN: 4.2 g/dL (ref 3.4–5.0)
ALKALINE PHOSPHATASE: 79 U/L (ref 46–116)
ALT (SGPT): 36 U/L (ref 10–49)
ANION GAP: 7 mmol/L (ref 5–14)
AST (SGOT): 24 U/L (ref <=34)
BILIRUBIN TOTAL: 0.4 mg/dL (ref 0.3–1.2)
BLOOD UREA NITROGEN: 27 mg/dL — ABNORMAL HIGH (ref 9–23)
BUN / CREAT RATIO: 13
CALCIUM: 9.6 mg/dL (ref 8.7–10.4)
CHLORIDE: 105 mmol/L (ref 98–107)
CO2: 22 mmol/L (ref 20.0–31.0)
CREATININE: 2.15 mg/dL — ABNORMAL HIGH
EGFR CKD-EPI AA MALE: 43 mL/min/{1.73_m2} — ABNORMAL LOW (ref >=60)
EGFR CKD-EPI NON-AA MALE: 37 mL/min/{1.73_m2} — ABNORMAL LOW (ref >=60)
GLUCOSE RANDOM: 94 mg/dL (ref 70–179)
POTASSIUM: 4.4 mmol/L (ref 3.4–4.8)
PROTEIN TOTAL: 7.4 g/dL (ref 5.7–8.2)
SODIUM: 134 mmol/L — ABNORMAL LOW (ref 135–145)

## 2020-08-10 LAB — MAGNESIUM: MAGNESIUM: 1.8 mg/dL (ref 1.6–2.6)

## 2020-08-10 LAB — HEPATIC FUNCTION PANEL
ALBUMIN: 4.2 g/dL (ref 3.4–5.0)
ALKALINE PHOSPHATASE: 79 U/L (ref 46–116)
ALT (SGPT): 36 U/L (ref 10–49)
AST (SGOT): 24 U/L (ref <=34)
BILIRUBIN DIRECT: 0.1 mg/dL (ref 0.00–0.30)
BILIRUBIN TOTAL: 0.4 mg/dL (ref 0.3–1.2)
PROTEIN TOTAL: 7.4 g/dL (ref 5.7–8.2)

## 2020-08-10 LAB — PHOSPHORUS
PHOSPHORUS: 5.3 mg/dL — ABNORMAL HIGH (ref 2.4–5.1)
PHOSPHORUS: 5.6 mg/dL — ABNORMAL HIGH (ref 2.4–5.1)

## 2020-08-10 LAB — C-REACTIVE PROTEIN: C-REACTIVE PROTEIN: 17 mg/L — ABNORMAL HIGH (ref <=10.0)

## 2020-08-10 MED ADMIN — blinatumomab (BLINCYTO) 28 mcg/day in sodium chloride NON-PVC 0.9 % IVPB (24-HR INFUSION): 28 ug/d | INTRAVENOUS | @ 22:00:00 | Stop: 2020-08-12

## 2020-08-10 MED ADMIN — dexamethasone (DECADRON) 4 mg/mL injection 16 mg: 16 mg | INTRAVENOUS | @ 21:00:00 | Stop: 2020-08-10

## 2020-08-10 MED ADMIN — PONATinib (ICLUSIG) tablet 30 mg **PATIENT SUPPLIED**: 30 mg | ORAL | @ 22:00:00

## 2020-08-10 MED ADMIN — sodium chloride (NS) 0.9 % flush 10 mL: 10 mL | INTRAVENOUS | @ 18:00:00

## 2020-08-10 MED ADMIN — gabapentin (NEURONTIN) capsule 300 mg: 300 mg | ORAL | @ 21:00:00

## 2020-08-10 NOTE — Unmapped (Addendum)
 Assessment/Plan: Adam 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 Pateient tolerated Cycle 2  well and was without complications.  Patient was discharged on D3.  Additional detail of the hospital course as below.       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 Initially diagnosed in 01/2020. Received GRAAPH induction. Induction course was c/b septic shock, candida krusei fungemia, MRSA bacteremia and septic emobli c/b acute renal failure and respiratory distress requriing dialysis and intubation. Post-induction BMBx demonstrated flow-based MRD-negative remission but low-level BCR-ABL persisted. He is s/p cycle 1 Blinatumomab that was c/b fevers and possibly MRSA pneumonia. He had an LP with IT chemo on 5/6 which was negative for disease. Plan is for one IT treatment per cycle. Next BMBx planned at the conclusion of cycle 5. He presents today for cycle 2 Blinatumomab at 28 mcg daily. C2D1=5/9   -Continue Valtrex, Isavoriconazole ppx on discharge  - Ponatinib 30 mg and ASA 81 mg daily while on Ponatinib both continued on discharge.       Disposition   - 5/11 will have bag hook up in infusion center   - Additional bag changes requested for 5/12, 5/14, 5/16, 5/23, and 5/30  - Follow up in clinic with [name], NP scheduled for 6/8   - LP with IT chemotherapy with Lorella Nimrod, NP scheduled for 6/17      Ok to treat order placed: 5/9         Nurse Navigator: Colletta Maryland, RN      Other Problems      DRESS: Developed rash previously while on multiple medications. Followed by Dr. Caryn Section in Dermatology. Likely culprit believed to be Dasatinib. Continues on prednisone taper. No rash seen on exam upon admission.   - Prednisone 10 mg daily      HTN: Chronic condition since diagnosis. Home regimen includes Amlodipine and Coreg.   - Amlodipine daily   - Coreg daily      Anxiety/Depression: Chronic condition. Followed by [facility] and sees local provider for ongoing Psychotherapy. Currently reporting better control since changing his meds during his last admission.   - Continue Nortriptyline 50 mg nightly and  Klonopin 1 mg PO BID PRN, on discharge     Chronic Back Pain - Skin pain/itching: CBP a chronic issue that has been complicated with previous prolonged hospital stays and procedures. The skin pain/itching began with Blinatumomab initiation. Followed by palliative care as an outpatient. Pain currently controlled after recently increasing OxyContin. He reports only needing additional Dilaudid ~1x daily. [place] formulary doesn't include OxyContin 15 mg therefore will try 10 mg BID and will adjust as needed.  Dilaudid 2-4 mg PO Q4HPRN for severe pain, Gabapentin 300 mg TID and OxyContin 10 mg BID will continue regimen on discharge     History of Candidemia: Recently restarted ppx with Isavoriconazole. Followed by [facility] as an outpatient.   - Isavoriconazole ppx daily as noted above      CKD: Arising from AKI and acute renal failure due to sepsis. Now s/p dialysis. His creatnine remains stable but appears to have developed CKD d/t incompletely healed AKI. Cr upon admission is stable at 2.15. Per chart review, his new baseline over the past few months is ~1.9 - 2.2.      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       Immunocompromised status: Patient is immunocompromised secondary to chemotherapy.   - Antimicrobial prophylaxis as above

## 2020-08-10 NOTE — Unmapped (Signed)
PALLIATIVE CARE PAIN FOLLOW-UP      Assessment/Plan:  1. Pain - Much improved today; previously inadequately relieved by nightly OxyContin and daytime PRN Dilaudid. Oxycontin increased from 15 mg nightly to 15 mg BID on 5/3.  - CONTINUE OxyContin15 mg BID; next refill due by 6/2  - Continue hydromorphone (Dilaudid) to 2-4 mg q4h PRN depending on pain severity - takes 0-1 times/day; has some days where he takes no doses; states he takes max 1 dose (2 mg)/day       - If pain score 4-6/10, take 2 mg (1 tablet) q4h PRN       - If pain score 7-10/10, take 4 mg (2 tablets) q4h PRN  - Encouraged to take Tylenol 650 mg q6h PRN  - Continue gabapentin 300 mg TID    2. Constipation - currently having daily BMs  - Continue senna 1 tablet nightly    3. Anxiety - follows with psychiatry  - Continue nortriptyline 50 mg nightly - Noted dose increase for nortriptyline from 25 mg daily to 50 mg per psychiatry; tolerating well  - Continue clonazepam to 0.5 mg nightly    F/u: 6/1 with Dr. Genice Rouge (virtual)    ----------------------------------------      Principal supportive care diagnosis: [name] with Ph+ B-ALL. Called patient today via telephone to follow-up on pain management. At last visit with Drs. Lavin/Markwalter of 4/27, OxyContin 15 mg nightly was continued and pt was encouraged to utilize PRN Hydromorphone 2-4 mg q4h PRN to assess pain needs and consider addition of daytime dose of OxyContin. Daytime dose of OxyContin added last week 5/2.    Summary of pain medicine: OxyContin 15 mg BID; Dilaudid 2-4 mg PO q4h PRN      Interval History 5/9: VR, [name] PharmD + Lavonda Jumbo increase of long-acting OxyContin from 15 mg nightly to 15 mg BID (started 5/3) well. Feels this manages his pain better than once nightly dosing. Not experiencing sleepiness or sedation during the day; still able to engage in daily activities.  Pain located primarily lower back/buttocks/legs. Using long-acting OxyContin 15 mg BID as well as hydromorphone 2 mg IR PRN pain very rarely; states some days he takes NO hydromorphone; takes max of 2 mg/day. Rates pain 5-8/10 depending on the day; pain worse with increased activity. Feels pain is manageable with current regimen.      Of note, pt seeing [name] PMHNP for psychiatry; on 5/2 nortriptyline was increased from 25 mg nightly to 50 mg. Last noted Scr 2.08; CrCl 64.7 mL/min. Pt tolerating dose increase well    Time spent with patient was 4 minutes.  Additional 10 minutes were spent on preparation, documentation and coordinating care.      [name], PharmD, CPP  Outpatient Oncology Palliative Care  Aug 03, 2020 4:40 PM

## 2020-08-10 NOTE — Unmapped (Unsigned)
Port accessed by [employer], labs drawn and sent.

## 2020-08-10 NOTE — Unmapped (Addendum)
Chemo C2D1.    Problem: Adult Inpatient Plan of Care  Goal: Plan of Care Review  Outcome: Ongoing - Unchanged  Goal: Patient-Specific Goal (Individualized)  Outcome: Ongoing - Unchanged  Goal: Absence of Hospital-Acquired Illness or Injury  Outcome: Ongoing - Unchanged  Goal: Optimal Comfort and Wellbeing  Outcome: Ongoing - Unchanged  Goal: Readiness for Transition of Care  Outcome: Ongoing - Unchanged  Goal: Rounds/Family Conference  Outcome: Ongoing - Unchanged     Problem: Infection  Goal: Absence of Infection Signs and Symptoms  Outcome: Ongoing - Unchanged

## 2020-08-10 NOTE — Unmapped (Signed)
Hematology/Oncology APP H&P    Date of Service: 08/10/2020  Primary Oncologist: [name]  Oncology Attending: Dr. Delynn Flavin     Assessment/Plan: Adam 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 Initially diagnosed in 01/2020. Received GRAAPH induction. Induction course was c/b septic shock, candida krusei fungemia, MRSA bacteremia and septic emobli c/b acute renal failure and respiratory distress requriing dialysis and intubation. Post-induction BMBx demonstrated flow-based MRD-negative remission but low-level BCR-ABL persisted. He is s/p cycle 1 Blinatumomab that was c/b fevers and possibly MRSA pneumonia. He had an LP with IT chemo on 5/6 which was negative for disease. Plan is for one IT treatment per cycle. Next BMBx planned at the conclusion of cycle 5. He presents today for cycle 2 Blinatumomab at 28 mcg daily.   - C2D1=5/9   - Valtrex, Isavoriconazole ppx   - Ponatinib 30 mg   - ASA 81 mg daily while on Ponatinib     Primary Oncologist:   Chemotherapy: Blinatumomab (for MRD)  Cycle: 2, D1 = 5/9 @ 28 mcg   - Blinatumomab 28 mcg days 1-3 (as inpatient)  - Plan to move timing of dose 4 to convenient time for home health bag changes  - Blinatumomab 28 mcg days 4-28 as an outpatient  - Dexamethasone 20 mg day 1    [ ]  Assess for neuro toxicity - ICE score daily, include handwriting  [ ]  Assess for cytokine release syndrome    Toxicities  - Frequent toxicities - pyrexia (60%), headache (34%), febrile neutropenia (28%), peripheral edema (26%), nausea (24%), hypokalemia (24%), constipation (21%), anemia (20%)  - 52% neurologic toxicity (mostly grade 1-2)    Prophylaxis:  Valacyclovir 500 mg PO daily    Disposition   - 5/11 will have bag hook up in infusion center   - Additional bag changes requested for 5/12, 5/14, 5/16, 5/23, and 5/30  - Follow up in clinic with [name], NP scheduled for 6/8   - LP with IT chemotherapy with Lorella Nimrod, NP scheduled for 6/17     Ok to treat order placed: 5/9      Future Appointments   Date Time Provider Department Center   08/13/2020 11:00 AM Amy Auburn Bilberry, MD [facility]   09/01/2020  1:00 PM Melissa Ronalee Belts, PMHNP [facility]   09/02/2020 12:30 PM Everlena Cooper, MD [id number]   09/08/2020  8:00 AM Park Breed, MD [facility]   09/09/2020 10:00 AM ADULT ONC LAB [facility]   09/09/2020 11:00 AM Sean Marrian Salvage, AGNP [id number]   09/09/2020 12:30 PM Everlena Cooper, MD [id number]       Nurse Navigator: Colletta [place], RN     Other Problems     DRESS: Developed rash previously while on multiple medications. Followed by Dr. Caryn Section in Dermatology. Likely culprit believed to be Dasatinib. Continues on prednisone taper. No rash seen on exam upon admission.   - Prednisone 10 mg daily     HTN: Chronic condition since diagnosis. Home regimen includes Amlodipine and Coreg.   - Amlodipine daily   - Coreg daily   ??  Anxiety/Depression: Chronic condition. Followed by [facility] and sees local provider for ongoing Psychotherapy. Currently reporting better control since changing his meds during his last admission.   -  Nortriptyline 50 mg nightly   - Klonopin 1 mg PO BID PRN  ??  Chronic Back Pain - Skin pain/itching: CBP a chronic issue that has been complicated with previous prolonged hospital stays and procedures. The skin pain/itching began with Blinatumomab initiation. Followed by palliative care as an outpatient. Pain currently controlled after recently increasing OxyContin. He reports only needing additional Dilaudid ~1x daily. [place] formulary doesn't include OxyContin 15 mg therefore will try 10 mg BID and will adjust as needed.   - Dilaudid 2-4 mg PO Q4HPRN for severe pain  - Gabapentin 300 mg TID   - OxyContin 10 mg BID   ??  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 Followed by [facility] as an outpatient.   - Isavoriconazole ppx daily as noted above     CKD: Arising from AKI and acute renal failure due to sepsis. Now s/p dialysis. His creatnine remains stable but appears to have developed CKD d/t incompletely healed AKI. Cr upon admission is stable at 2.15. Per chart review, his new baseline over the past few months is ~1.9 - 2.2.     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       Immunocompromised status: Patient is immunocompromised secondary to chemotherapy.   - Antimicrobial prophylaxis as above    DVT Ppx: no, encourage ambulation  FEN:  - fluids: yes, per chemo orders  - electrolytes: replace as needed   - diet: regular     Need for PT: no  Anticipated Discharge Date: 5/11    Code Status:   Full Code, confirmed on admission      HPI: Adam 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     Since last seen here, the patient has been doing well. He notes being able to complete his ADL's at home with little issue. He is able to work around his house in segments, taking breaks as needed when he gets too tired. He denies any fevers. Continues to have a cough, but nothing unchanged from previous. Denies sick contacts. No longer having SOB. His moods have improved since starting new medications. He also reports his rash has not returned after extending his Prednisone taper.     Otherwise, he denies new constitutional symptoms such as anorexia, weight loss, fatigue, night sweats or unexplained fevers.  Furthermore, he denies symptoms of marrow failure: unexplained bleeding or bruising, recurrent or unexplained intercurrent infections, dyspnea on exertion, lightheadedness, palpitations or chest pain.  There have been no new or unexplained pains or self-identified masses, swelling or enlarged lymph nodes.    Review of Systems: A ten point review of systems was performed. Pertinent positives are listed above, all others are negative.    Oncologic History:   Primary Oncologist: [name]   Oncology History Overview Note   Referring/Local Oncologist: None    Diagnosis:Ph+ ALL    Genetics:    Karyotype/FISH:Abnormal Karyotype: 46,XY,t(9;22)(q34;q11.2)[1]/45,XY,der(7;9)(q10;q10)t(9;22)(q34;q11.2),der(22)t(9;22)[11]/46,sdl,+der(22)t(9;22)[5]/46,XY[3]     Abnormal FISH: A BCR/ABL1 interphase FISH assay shows an abnormal signal pattern in 97% of the 100 cells scored. Of note, 2/97 abnormal cells have an additional BCR/ABL1 fusion signal from the der(22) chromosome, consistent with the additional copy of the  der(22) seen in clone 3 by G-banding.  The findings support a diagnosis of leukemia and have implications for targeted therapy and for monitoring residual disease.      Molecular Genetics:  BCR-ABL1 p210 transcripts were detected at a level of 46.479 IS% ratio in bone marrow.  BCR-ABL1 p190 transcripts were detected at a level of 4 in 100,000 cells in bone marrow.    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)     01/24/2020 - 04/02/2020 Chemotherapy    IP/OP LEUKEMIA GRAAPH-2005 + RITUXIMAB < 60 YO  rituximab hypercvad (odd and even course)     02/24/2020 Remission    CR with PCR MRD+; marrow showing 70% blasts with <1% blasts, negative flow MRD, BCR-ABL p210 PCR 0.197%; CNS negative for blasts     03/09/2020 Progression    CSF - rare blast ID'ed - IT chemo given    03/13/20 - IT chemo, CSF negative     04/16/2020 Biopsy    BM Bx with 40-50% cellularity, <1% blasts, MRD flow cytometry negative, BCR-ABL p210 transcripts 0.036%     05/28/2020 - 05/28/2020 Chemotherapy    OP AML - CNS THERAPY (INTRATHECAL CYTARABINE, INTRATHECAL METHOTREXATE, OR INTRATHECAL TRIPLE)  Select one of the following: cytarabine IT 100 mg with hydrocortisone 50 mg, cytarabine IT 40 mg with methotrexate 15 mg with hydrocortisone 50 mg, OR methotrexate IT 12 mg with hydrocortisone 50 mg     06/19/2020 -  Chemotherapy    IP/OP LEUKEMIA BLINATUMOMAB 7-DAY INFUSION (MINIMAL RESIDUAL DISEASE; WT >= 22 KG) (HOME INFUSION)      Cycles 1*-4: Blinatumomab 28 mcg/day Days 1-28 of 6-week cycle.  *Given in the inpatient setting on Days 1-3 on Cycle 1 and Days 1-2 on Cycle 2, while other treatment days are given in the outpatient setting.    Cycle 1 MRD Dosing     07/18/2020 Adverse Reaction    Hospitalization: fevers. Pneumonia     07/23/2020 Biopsy    Diagnosis  Bone marrow, right iliac, aspiration and biopsy  -   Normocellular bone marrow (50%) with trilineage hematopoiesis and 1% blasts by manual aspirate differential  -   Flow cytometry MRD analysis reveals no definitive immunophenotypic evidence of residual B lymphoblastic leukemia   - BCR-ABL p210 0.006%             Medical History:  PCP: Dierdre Harness, MD  Past Medical History:   Diagnosis Date   ??? 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 BONE MARROW BIOPSY & ASPIRATION  01/21/2020        ??? IR INSERT PORT AGE GREATER THAN 5 YRS  03/10/2020    IR INSERT PORT AGE GREATER THAN 5 YRS 03/10/2020 [name], MD IMG VIR H&V [facility]   ??? PR BRONCHOSCOPY,DIAGNOSTIC W LAVAGE Bilateral 07/20/2020    Procedure: BRONCHOSCOPY, RIGID OR FLEXIBLE, INCLUDE FLUOROSCOPIC GUIDANCE WHEN PERFORMED; W/BRONCHIAL ALVEOLAR LAVAGE WITH MODERATE SEDATION;  Surgeon: Dellis Filbert, MD;  Location: BRONCH PROCEDURE LAB [facility];  Service: Pulmonary      Social History:  Social History     Socioeconomic History   ??? Marital status: Married     Spouse  name: Not on file   ??? Number of children: Not on file   ??? Years of education: Not on file   ??? Highest education level: Not on file   Occupational History   ??? Not on file   Tobacco Use   ??? Smoking status: Former Smoker     Packs/day: 2.00     Types: Cigarettes     Quit date: 01/20/2020     Years since quitting: 0.5   ??? Smokeless tobacco: Former Neurosurgeon     Types: Musician Use   ??? Vaping Use: Never used   Substance and Sexual Activity   ??? Alcohol use: Not on file   ??? Drug use: Not on file   ??? Sexual activity: Not on file   Other Topics Concern   ??? Do you use sunscreen? No   ??? Tanning bed use? No   ??? Are you easily burned? Yes   ??? Excessive sun exposure? No   ??? Blistering sunburns? Yes   Social History Narrative   ??? Not on file     Social Determinants of Health     Financial Resource Strain: Low Risk    ??? Difficulty of Paying Living Expenses: Not very hard   Food Insecurity: No Food Insecurity   ??? Worried About Running Out of Food in the Last Year: Never true   ??? Ran Out of Food in the Last Year: Never true   Transportation Needs: No Transportation Needs   ??? Lack of Transportation (Medical): No   ??? Lack of Transportation (Non-Medical): No   Physical Activity: Not on file   Stress: Not on file   Social Connections: Not on file     Living situation: the patient lives with his/her family  Functional Status:     ECOG Performance Status: 1 - Restricted in physically strenuous activity but ambulatory and able to carry out work of a light or sedentary nature, e.g., light house work, office work       Family History   Problem Relation Age of Onset   ??? Melanoma Neg Hx    ??? Basal cell carcinoma Neg Hx    ??? Squamous cell carcinoma Neg Hx    No family history of blood cancers or blood disorders reported upon admission.      Allergies: is allergic to bupropion hcl, dapsone, onion, vancomycin analogues, bismuth subsalicylate, ceftaroline fosamil, and furosemide.    Medications:  No current facility-administered medications on file prior to encounter.     Current Outpatient Medications on File Prior to Encounter   Medication Sig Dispense Refill   ??? acetaminophen (TYLENOL) 325 MG tablet Take 650 mg by mouth every six (6) hours as needed for pain.      ??? amLODIPine (NORVASC) 10 MG tablet Take 1 tablet (10 mg total) by mouth daily. 30 tablet 0   ??? aspirin 81 MG chewable tablet Chew 1 tablet (81 mg total) daily. 36 tablet 11   ??? carvediloL (COREG) 12.5 MG tablet TAKE 1 TABLET BY MOUTH TWO TIMES A DAY. 180 tablet 1   ??? clonazePAM (KLONOPIN) 0.5 MG tablet Take 1 tablet (0.5 mg total) by mouth two (2) times a day as needed for anxiety. 28 tablet 0   ??? gabapentin (NEURONTIN) 300 MG capsule Take 1 capsule (300 mg total) by mouth Three (3) times a day. 90 capsule 0   ??? [EXPIRED] HYDROmorphone (DILAUDID) 2 MG tablet Take 1 tablet (2 mg total) by mouth every  four (4) hours as needed for pain,severe (7-10) for up to 14 days. 56 tablet 0   ??? [EXPIRED] hydrOXYzine (ATARAX) 25 MG tablet Take 1 tablet (25 mg total) by mouth every six (6) hours as needed. (Patient not taking: Reported on 07/18/2020) 60 tablet 1   ??? hydrOXYzine (ATARAX) 25 MG tablet Take 25 mg by mouth Three (3) times a day.     ??? isavuconazonium sulfate (CRESEMBA) 186 mg cap capsule Take 2 capsules (372 mg total) by mouth daily. 56 capsule 2   ??? [EXPIRED] linezolid (ZYVOX) 600 mg tablet Take 1 tablet (600 mg total) by mouth every twelve (12) hours for 14 days. 28 tablet 0   ??? nortriptyline (PAMELOR) 50 MG capsule Take 1 capsule (50 mg total) by mouth nightly. 30 capsule 11   ??? [EXPIRED] ondansetron (ZOFRAN) 8 MG tablet Take 1 tablet (8 mg total) by mouth every eight (8) hours as needed for nausea (or itching). 60 tablet 2   ??? oxyCODONE (OXYCONTIN) 15 mg 12 hr crush resistant ER/CR tablet Take 1 tablet (15 mg total) by mouth two (2) times a day. 60 tablet 0   ??? oxyCODONE myristate (XTAMPZA ER) 13.5 mg CSpT Take 13.5 mg by mouth every twelve (12) hours. 60 each 0   ??? PONATinib (ICLUSIG) 30 mg tablet Take 1 tablet (30 mg total) by mouth daily. Swallow tablets whole. Do not crush, break, cut or chew tablets. 30 tablet 5   ??? predniSONE (DELTASONE) 10 MG tablet Take 2 tablets (20 mg total) by mouth daily for 14 days, THEN 1 tablet (10 mg total) daily for 14 days. 42 tablet 0   ??? prochlorperazine (COMPAZINE) 10 MG tablet Take 1 tablet (10 mg total) by mouth every six (6) hours as needed for nausea (if no relief from zofran (ondansetron)). 60 tablet 1   ??? sulfamethoxazole-trimethoprim (BACTRIM DS) 800-160 mg per tablet Take 1 tablet (160 mg of trimethoprim total) by mouth 2 times a day on Saturday, Sunday. For prophylaxis while on chemo. 48 tablet 3   ??? valACYclovir (VALTREX) 500 MG tablet Take 1 tablet (500 mg total) by mouth daily. 90 tablet 3       Objective:   Temp:  [35.7 ??C (96.3 ??F)-36.6 ??C (97.9 ??F)] 36.6 ??C (97.9 ??F)  Heart Rate:  [94-97] 94  Resp:  [18] 18  BP: (142-144)/(84-89) 142/84  BMI (Calculated):  [29.52] 29.52          Physical Exam:  General: Resting, in no apparent distress   HEENT: PER. No scleral icterus or conjunctival injection. Moist mucous membranes. Oropharhynx without ulceration, erythema, or exudate.   Lymphatics: No lymphadenopathy in the anterior/posterior cervical, supraclavicular, or axillary basins.  CV: Regular rate and rhthym.  S1, S2 normal.  No murmurs, gallops, or rubs. Extremities appear well-perfused.   Resp: Breathing is unlabored, and patient is speaking full sentences with ease. CTAB.    GI: Non-distended, non-tender. BS normoactive x4.  No palpable hepatomegaly or splenomegaly.  No palpable masses.  Skin: Warm, dry, intact. No rashes, petechiae, or purpura.  No areas of skin breakdown.  Musculoskeletal: No grossly-evident joint effusions or deformities.  Range of motion in shoulders, elbows, hips and knees is grossly normal.  Psychiatric: Attentive, calm, cooperative, conversant, euthymic mood, appropriate affect, organized thinking free of delusion, reasonable insight and judgement.  Neurologic: A&Ox4. Gait is normal. CNII-CNXII grossly intact, no focal deficits.   CVAD: R CW Port - nontender, no erythema or exudate; Dressing CDI.  Test Results  Data Review:    All lab results last 24 hours:    Recent Results (from the past 24 hour(s))   Magnesium Level    Collection Time: 08/10/20 10:14 AM   Result Value Ref Range    Magnesium 1.8 1.6 - 2.6 mg/dL   Hepatic function panel    Collection Time: 08/10/20 10:14 AM   Result Value Ref Range    Albumin 4.2 3.4 - 5.0 g/dL    Total Protein 7.4 5.7 - 8.2 g/dL    Total Bilirubin 0.4 0.3 - 1.2 mg/dL    Bilirubin, Direct 1.61 0.00 - 0.30 mg/dL    AST 24 <=09 U/L    ALT 36 10 - 49 U/L    Alkaline Phosphatase 79 46 - 116 U/L   C-reactive protein    Collection Time: 08/10/20 10:14 AM   Result Value Ref Range    CRP 17.0 (H) <=10.0 mg/L   Comprehensive Metabolic Panel    Collection Time: 08/10/20 10:14 AM   Result Value Ref Range    Sodium 134 (L) 135 - 145 mmol/L    Potassium 4.4 3.4 - 4.8 mmol/L    Chloride 105 98 - 107 mmol/L    Anion Gap 7 5 - 14 mmol/L    CO2 22.0 20.0 - 31.0 mmol/L    BUN 27 (H) 9 - 23 mg/dL    Creatinine 6.04 (H) 0.60 - 1.10 mg/dL    BUN/Creatinine Ratio 13     EGFR CKD-EPI Non-African American, Male 37 (L) >=60 mL/min/1.59m2    EGFR CKD-EPI African American, Male 43 (L) >=60 mL/min/1.45m2    Glucose 94 70 - 179 mg/dL    Calcium 9.6 8.7 - 54.0 mg/dL    Albumin 4.2 3.4 - 5.0 g/dL    Total Protein 7.4 5.7 - 8.2 g/dL    Total Bilirubin 0.4 0.3 - 1.2 mg/dL    AST 24 <=98 U/L    ALT 36 10 - 49 U/L    Alkaline Phosphatase 79 46 - 116 U/L   Phosphorus    Collection Time: 08/10/20 10:14 AM   Result Value Ref Range    Phosphorus 5.3 (H) 2.4 - 5.1 mg/dL   CBC w/ Differential    Collection Time: 08/10/20 10:14 AM   Result Value Ref Range    WBC 12.4 (H) 3.6 - 11.2 10*9/L    RBC 3.92 (L) 4.26 - 5.60 10*12/L    HGB 12.7 (L) 12.9 - 16.5 g/dL    HCT 11.9 (L) 14.7 - 48.0 %    MCV 92.0 77.6 - 95.7 fL    MCH 32.3 25.9 - 32.4 pg    MCHC 35.2 32.0 - 36.0 g/dL    RDW 82.9 (H) 56.2 - 15.2 %    MPV 7.7 6.8 - 10.7 fL    Platelet 199 150 - 450 10*9/L    Neutrophils % 77.1 %    Lymphocytes % 16.9 %    Monocytes % 1.3 %    Eosinophils % 3.5 %    Basophils % 1.2 %    Absolute Neutrophils 9.6 (H) 1.8 - 7.8 10*9/L    Absolute Lymphocytes 2.1 1.1 - 3.6 10*9/L    Absolute Monocytes 0.2 (L) 0.3 - 0.8 10*9/L    Absolute Eosinophils 0.4 0.0 - 0.5 10*9/L    Absolute Basophils 0.1 0.0 - 0.1 10*9/L    Anisocytosis Slight (A) Not Present     Imaging: None      Time spent  on counseling/coordination of care: 1 Hour  Total time spent with patient: 19 Minutes      Gevena Mart, PA-C   Hematology/Oncology Department   [facility]   Pager: [phone]    08/10/20

## 2020-08-11 LAB — CBC W/ AUTO DIFF
BASOPHILS ABSOLUTE COUNT: 0 10*9/L (ref 0.0–0.1)
BASOPHILS RELATIVE PERCENT: 0.2 %
EOSINOPHILS ABSOLUTE COUNT: 0 10*9/L (ref 0.0–0.5)
EOSINOPHILS RELATIVE PERCENT: 0.1 %
HEMATOCRIT: 35.5 % — ABNORMAL LOW (ref 39.0–48.0)
HEMOGLOBIN: 12.2 g/dL — ABNORMAL LOW (ref 12.9–16.5)
LYMPHOCYTES ABSOLUTE COUNT: 0.6 10*9/L — ABNORMAL LOW (ref 1.1–3.6)
LYMPHOCYTES RELATIVE PERCENT: 6.3 %
MEAN CORPUSCULAR HEMOGLOBIN CONC: 34.3 g/dL (ref 32.0–36.0)
MEAN CORPUSCULAR HEMOGLOBIN: 31.6 pg (ref 25.9–32.4)
MEAN CORPUSCULAR VOLUME: 92.3 fL (ref 77.6–95.7)
MEAN PLATELET VOLUME: 7.7 fL (ref 6.8–10.7)
MONOCYTES ABSOLUTE COUNT: 0 10*9/L — ABNORMAL LOW (ref 0.3–0.8)
MONOCYTES RELATIVE PERCENT: 0.5 %
NEUTROPHILS ABSOLUTE COUNT: 8.1 10*9/L — ABNORMAL HIGH (ref 1.8–7.8)
NEUTROPHILS RELATIVE PERCENT: 92.9 %
PLATELET COUNT: 189 10*9/L (ref 150–450)
RED BLOOD CELL COUNT: 3.85 10*12/L — ABNORMAL LOW (ref 4.26–5.60)
RED CELL DISTRIBUTION WIDTH: 18.4 % — ABNORMAL HIGH (ref 12.2–15.2)
WBC ADJUSTED: 8.7 10*9/L (ref 3.6–11.2)

## 2020-08-11 LAB — C-REACTIVE PROTEIN: C-REACTIVE PROTEIN: 20 mg/L — ABNORMAL HIGH (ref <=10.0)

## 2020-08-11 LAB — BASIC METABOLIC PANEL
ANION GAP: 8 mmol/L (ref 5–14)
BLOOD UREA NITROGEN: 39 mg/dL — ABNORMAL HIGH (ref 9–23)
BUN / CREAT RATIO: 17
CALCIUM: 10.2 mg/dL (ref 8.7–10.4)
CHLORIDE: 105 mmol/L (ref 98–107)
CO2: 23 mmol/L (ref 20.0–31.0)
CREATININE: 2.23 mg/dL — ABNORMAL HIGH
EGFR CKD-EPI AA MALE: 41 mL/min/{1.73_m2} — ABNORMAL LOW (ref >=60)
EGFR CKD-EPI NON-AA MALE: 35 mL/min/{1.73_m2} — ABNORMAL LOW (ref >=60)
GLUCOSE RANDOM: 132 mg/dL (ref 70–179)
POTASSIUM: 5.1 mmol/L — ABNORMAL HIGH (ref 3.4–4.8)
SODIUM: 136 mmol/L (ref 135–145)

## 2020-08-11 LAB — HEPATIC FUNCTION PANEL
ALBUMIN: 4.1 g/dL (ref 3.4–5.0)
ALKALINE PHOSPHATASE: 74 U/L (ref 46–116)
ALT (SGPT): 32 U/L (ref 10–49)
AST (SGOT): 26 U/L (ref <=34)
BILIRUBIN DIRECT: 0.1 mg/dL (ref 0.00–0.30)
BILIRUBIN TOTAL: 0.4 mg/dL (ref 0.3–1.2)
PROTEIN TOTAL: 7.4 g/dL (ref 5.7–8.2)

## 2020-08-11 LAB — MAGNESIUM: MAGNESIUM: 2.2 mg/dL (ref 1.6–2.6)

## 2020-08-11 LAB — PHOSPHORUS: PHOSPHORUS: 4.7 mg/dL (ref 2.4–5.1)

## 2020-08-11 MED ADMIN — PONATinib (ICLUSIG) tablet 30 mg **PATIENT SUPPLIED**: 30 mg | ORAL | @ 21:00:00

## 2020-08-11 MED ADMIN — gabapentin (NEURONTIN) capsule 300 mg: 300 mg | ORAL | @ 18:00:00

## 2020-08-11 MED ADMIN — predniSONE (DELTASONE) tablet 10 mg: 10 mg | ORAL | @ 14:00:00

## 2020-08-11 MED ADMIN — sodium chloride (NS) 0.9 % flush 10 mL: 10 mL | INTRAVENOUS | @ 14:00:00

## 2020-08-11 MED ADMIN — carvediloL (COREG) tablet 12.5 mg: 12.5 mg | ORAL | @ 14:00:00

## 2020-08-11 MED ADMIN — HYDROmorphone (DILAUDID) tablet 2 mg: 2 mg | ORAL | @ 22:00:00 | Stop: 2020-08-24

## 2020-08-11 MED ADMIN — blinatumomab (BLINCYTO) 28 mcg/day in sodium chloride NON-PVC 0.9 % IVPB (24-HR INFUSION): 28 ug/d | INTRAVENOUS | @ 22:00:00 | Stop: 2020-08-12

## 2020-08-11 MED ADMIN — isavuconazonium sulfate (CRESEMBA) capsule 372 mg: 372 mg | ORAL | @ 14:00:00

## 2020-08-11 MED ADMIN — carvediloL (COREG) tablet 12.5 mg: 12.5 mg | ORAL | @ 01:00:00

## 2020-08-11 MED ADMIN — HYDROmorphone (DILAUDID) tablet 2 mg: 2 mg | ORAL | @ 01:00:00 | Stop: 2020-08-24

## 2020-08-11 MED ADMIN — amLODIPine (NORVASC) tablet 10 mg: 10 mg | ORAL | @ 14:00:00

## 2020-08-11 MED ADMIN — gabapentin (NEURONTIN) capsule 300 mg: 300 mg | ORAL | @ 01:00:00

## 2020-08-11 MED ADMIN — aspirin chewable tablet 81 mg: 81 mg | ORAL | @ 14:00:00

## 2020-08-11 MED ADMIN — oxyCODONE (OxyCONTIN) 12 hr crush resistant ER/CR tablet 10 mg: 10 mg | ORAL | @ 01:00:00 | Stop: 2020-08-24

## 2020-08-11 MED ADMIN — gabapentin (NEURONTIN) capsule 300 mg: 300 mg | ORAL | @ 14:00:00

## 2020-08-11 MED ADMIN — oxyCODONE (OxyCONTIN) 12 hr crush resistant ER/CR tablet 10 mg: 10 mg | ORAL | @ 14:00:00 | Stop: 2020-08-24

## 2020-08-11 MED ADMIN — valACYclovir (VALTREX) tablet 500 mg: 500 mg | ORAL | @ 14:00:00

## 2020-08-11 MED ADMIN — nortriptyline (PAMELOR) capsule 50 mg: 50 mg | ORAL | @ 03:00:00

## 2020-08-11 MED FILL — ICLUSIG 30 MG TABLET: ORAL | 30 days supply | Qty: 30 | Fill #2

## 2020-08-11 NOTE — Unmapped (Signed)
Care Management  Initial Transition Planning Assessment              General  Care Manager assessed the patient by : In person interview with patient, Medical record review, Discussion with Clinical Care team  Orientation Level: Oriented X4  Functional level prior to admission: Independent  Reason for referral: Discharge Planning     CM met with patient in pt room.  Pt/visitors were not wearing hospital provided masks for the duration of the interaction with CM.   CM was wearing hospital provided surgical mask and hospital provided eye protection.  CM was not within 6 foot of the patient/visitors during this interaction.     Contact/Decision Maker  Extended Emergency Contact Information  Primary Emergency Contact: Adam Keith, Adam Keith  Mobile Phone: [phone]  Relation: Spouse  Preferred language: ENGLISH  Interpreter needed? No    Legal Next of Kin / Guardian / POA / Advance Directives     HCDM (patient stated preference): Adam Keith, Adam Keith - Spouse - [phone]    Advance Directive (Medical Treatment)  Does patient have an advance directive covering medical treatment?: Patient does not have advance directive covering medical treatment.  Reason patient does not have an advance directive covering medical treatment:: Patient does not wish to complete one at this time.    Health Care Decision Maker [HCDM] (Medical & Mental Health Treatment)  Healthcare Decision Maker: HCDM documented in the HCDM/Contact Info section.  Information offered on HCDM, Medical & Mental Health advance directives:: Patient declined information.         Patient Information  Lives with: Spouse/significant other    Type of Residence: Private residence     [address]  [place] [address]     Physical address:  [address]  [place], [place] [address]             Support Systems/Concerns: Spouse, Children    Responsibilities/Dependents at home?: No    Home Care services in place prior to admission?: No                  Equipment Currently Used at Home: none (Previously had oxygen but states he is returning it. Has RW, BSC, and shower chair, if needed, but is not currently using.)       Currently receiving outpatient dialysis?: No       Financial Information       Need for financial assistance?: No       Social Determinants of Health  Social Determinants of Health     Tobacco Use: Medium Risk   ??? Smoking Tobacco Use: Former Smoker   ??? Smokeless Tobacco Use: Former Neurosurgeon   Alcohol Use: Not on Programmer, applications Strain: Low Risk    ??? Difficulty of Paying Living Expenses: Not hard at all   Food Insecurity: No Food Insecurity   ??? Worried About Programme researcher, broadcasting/film/video in the Last Year: Never true   ??? Ran Out of Food in the Last Year: Never true   Transportation Needs: No Transportation Needs   ??? Lack of Transportation (Medical): No   ??? Lack of Transportation (Non-Medical): No   Physical Activity: Not on file   Stress: Not on file   Social Connections: Not on file   Intimate Partner Violence: Not on file   Depression: Not on file   Housing/Utilities: Low Risk    ??? Within the past 12 months, have you ever stayed: outside, in a car, in  a tent, in an overnight shelter, or temporarily in someone else's home (i.e. couch-surfing)?: No   ??? Are you worried about losing your housing?: No   ??? Within the past 12 months, have you been unable to get utilities (heat, electricity) when it was really needed?: No   Substance Use: Not on file   Health Literacy: Not on file       Discharge Needs Assessment  Concerns to be Addressed: discharge planning    Clinical Risk Factors: Principal Diagnosis: Cancer, Stroke, COPD, Heart Failure, AMI, Pneumonia, Joint Replacment, Multiple Diagnoses (Chronic)    Barriers to taking medications: No    Prior overnight hospital stay or ED visit in last 90 days: Yes    Readmission Within the Last 30 Days: planned readmission         Anticipated Changes Related to Illness: none    Equipment Needed After Discharge: none    Discharge Facility/Level of Care Needs:  (N/A)    Readmission  Risk of Unplanned Readmission Score: UNPLANNED READMISSION SCORE: 38%  Predictive Model Details          38% (High)  Factor Value    Calculated 08/11/2020 12:03 19% Number of active Rx orders 43    [place] Risk of Unplanned Readmission Model 13% Number of hospitalizations in last year 4     11% Number of ED visits in last six months 3     7% Diagnosis of cancer present     6% Active antipsychotic Rx order present     6% ECG/EKG order present in last 6 months     5% Latest BUN high (39 mg/dL)     5% Encounter of ten days or longer in last year present     5% Diagnosis of electrolyte disorder present     4% Imaging order present in last 6 months     4% Latest hemoglobin low (12.2 g/dL)     4% Phosphorous result present     3% Active corticosteroid Rx order present     3% Latest creatinine high (2.23 mg/dL)     2% Age 40     2% Charlson Comorbidity Index 2     1% Future appointment scheduled     1% Current length of stay 1.027 days     1% Active ulcer medication Rx order present      Readmitted Within the Last 30 Days? (No if blank) Yes  Patient at risk for readmission?: Yes    Discharge Plan  Screen findings are: Care Manager reviewed the plan of the patient's care with the Multidisciplinary Team. No discharge planning needs identified at this time. Care Manager will continue to manage plan and monitor patient's progress with the team.    Expected Discharge Date: 08/12/2020    Expected Transfer from Critical Care:  (N/A)    Quality data for continuing care services shared with patient and/or representative?: No  Patient and/or family were provided with choice of facilities / services that are available and appropriate to meet post hospital care needs?: No       Initial Assessment complete?: Yes

## 2020-08-11 NOTE — Unmapped (Signed)
Hematology/Oncology APP H&P    Date of Service: 08/11/2020  Primary Oncologist: [name]  Oncology Attending: Dr. Delynn Flavin     Assessment/Plan: Adam 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     Today's Plan: D2 Cycle 2 HyperCVAD=5/10.  Patient tolerating treatment well thus far.  No signs of CRS on exam.  Patient's ICE score 10/10 today.  Continue Valtrex and Cresemba ppx.  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 Continue chronic medications from hypertension, chronic back pain, and anxiety/depression; detailed below.    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 Initially diagnosed in 01/2020. Received GRAAPH induction. Induction course was c/b septic shock, candida krusei fungemia, MRSA bacteremia and septic emobli c/b acute renal failure and respiratory distress requriing dialysis and intubation. Post-induction BMBx demonstrated flow-based MRD-negative remission but low-level BCR-ABL persisted. He is s/p cycle 1 Blinatumomab that was c/b fevers and possibly MRSA pneumonia. He had an LP with IT chemo on 5/6 which was negative for disease. Plan is for one IT treatment per cycle. Next BMBx planned at the conclusion of cycle 5. He presents today for cycle 2 Blinatumomab at 28 mcg daily.   - C2D1=5/9   - Valtrex, Isavoriconazole ppx   - Ponatinib 30 mg   - ASA 81 mg daily while on Ponatinib     Primary Oncologist:   Chemotherapy: Blinatumomab (for MRD)  Cycle: 2, D1 = 5/9 @ 28 mcg   - Blinatumomab 28 mcg days 1-3 (as inpatient)  - Plan to move timing of dose 4 to convenient time for home health bag changes  - Blinatumomab 28 mcg days 4-28 as an outpatient  - Dexamethasone 20 mg day 1    [ ]  Assess for neuro toxicity - ICE score daily, include handwriting  [ ]  Assess for cytokine release syndrome    Toxicities  - Frequent toxicities - pyrexia (60%), headache (34%), febrile neutropenia (28%), peripheral edema (26%), nausea (24%), hypokalemia (24%), constipation (21%), anemia (20%)  - 52% neurologic toxicity (mostly grade 1-2)    Prophylaxis:  - Valtrex, Isavoriconazole ppx     Disposition   - 5/11 will have bag hook up in infusion center   - Additional bag changes requested for 5/12, 5/14, 5/16, 5/23, and 5/30  - Follow up in clinic with [name], NP scheduled for 6/8   - LP with IT chemotherapy with Lorella Nimrod, NP scheduled for 6/17     Ok to treat order placed: 5/9      Future Appointments   Date Time Provider Department Center   08/12/2020  2:30 PM ONCINF CHAIR 26 [id number]   08/13/2020 11:00 AM Amy Auburn Bilberry, MD DERMMRKT [facility]   08/13/2020  2:00 PM ONCINF CHAIR 03 [facility]   08/13/2020  2:20 PM Maryanna Shape Holliday, RD/LDN DIETUCA [facility]   08/15/2020  1:00 PM ONCINF CHAIR 02 [facility]   08/17/2020  2:00 PM ONCINF CHAIR 30 [id number]   08/24/2020  2:00 PM ONCINF CHAIR 24 [facility]   08/31/2020  1:00 PM ONCINF CHAIR 19 [facility]   09/01/2020  1:00 PM Melissa Ronalee Belts, PMHNP [id number] [facility]   09/02/2020 12:30 PM Everlena Cooper, MD [facility]   09/07/2020  2:00 PM ONCINF CHAIR 41 [facility]   09/08/2020  8:00 AM Park Breed, MD [facility]  09/09/2020 10:00 AM ADULT ONC LAB [facility]   09/09/2020 11:00 AM Sean Marrian Salvage, AGNP [facility]   09/09/2020 12:30 PM Everlena Cooper, MD [facility]   09/18/2020 12:30 PM ADULT ONC LAB [facility]   09/18/2020  1:30 PM Vernie Murders, AGNP [id number]       Nurse Navigator: Colletta Maryland, RN     Other Problems     DRESS: Developed rash previously while on multiple medications. Followed by Dr. Caryn Section in Dermatology. Likely culprit believed to be Dasatinib. Continues on prednisone taper. No rash seen on exam upon admission.   - Prednisone 10 mg daily     HTN: Chronic condition since diagnosis. Home regimen includes Amlodipine and Coreg.   - Amlodipine daily   - Coreg daily   ??  Anxiety/Depression: Chronic condition. Followed by [facility] and sees local provider for ongoing Psychotherapy. Currently reporting better control since changing his meds during his last admission.   - Nortriptyline 50 mg nightly   - Klonopin 1 mg PO BID PRN  ??  Chronic Back Pain - Skin pain/itching: CBP a chronic issue that has been complicated with previous prolonged hospital stays and procedures. The skin pain/itching began with Blinatumomab initiation. Followed by palliative care as an outpatient. Pain currently controlled after recently increasing OxyContin. He reports only needing additional Dilaudid ~1x daily. [place] formulary doesn't include OxyContin 15 mg therefore will try 10 mg BID and will adjust as needed.   - Dilaudid 2-4 mg PO Q4HPRN for severe pain  - Gabapentin 300 mg TID   - OxyContin 10 mg BID   ??  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 Followed by [facility] as an outpatient.   - Isavoriconazole ppx daily as noted above     CKD: Arising from AKI and acute renal failure due to sepsis. Now s/p dialysis. His creatnine remains stable but appears to have developed CKD d/t incompletely healed AKI. Cr upon admission is stable at 2.15. Per chart review, his new baseline over the past few months is ~1.9 - 2.2.     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       Immunocompromised status: Patient is immunocompromised secondary to chemotherapy.   - Antimicrobial prophylaxis as above    DVT Ppx: no, encourage ambulation  FEN:  - fluids: yes, per chemo orders  - electrolytes: replace as needed   - diet: regular     Need for PT: no  Anticipated Discharge Date: 5/11    Code Status:   Full Code, confirmed on admission      HPI: Adam 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     Since last seen here, the patient has been doing well. He notes being able to complete his ADL's at home with little issue. He is able to work around his house in segments, taking breaks as needed when he gets too tired. He denies any fevers. Continues to have a cough, but nothing unchanged from previous. Denies sick contacts. No longer having SOB. His moods have improved since starting new medications. He also reports his rash has not  returned after extending his Prednisone taper.     Otherwise, he denies new constitutional symptoms such as anorexia, weight loss, fatigue, night sweats or unexplained fevers.  Furthermore, he denies symptoms of marrow failure: unexplained bleeding or bruising, recurrent or unexplained intercurrent infections, dyspnea on exertion, lightheadedness, palpitations or chest pain.  There have been no new or unexplained pains or self-identified masses, swelling or enlarged lymph nodes.    Review of Systems: A ten point review of systems was performed. Pertinent positives are listed above, all others are negative.    Oncologic History:   Primary Oncologist: [name]   Oncology History Overview Note   Referring/Local Oncologist: None    Diagnosis:Ph+ ALL    Genetics:    Karyotype/FISH:Abnormal Karyotype: 46,XY,t(9;22)(q34;q11.2)[1]/45,XY,der(7;9)(q10;q10)t(9;22)(q34;q11.2),der(22)t(9;22)[11]/46,sdl,+der(22)t(9;22)[5]/46,XY[3]     Abnormal FISH: A BCR/ABL1 interphase FISH assay shows an abnormal signal pattern in 97% of the 100 cells scored. 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  The findings support a diagnosis of leukemia and have implications for targeted therapy and for monitoring residual disease.      Molecular Genetics:  BCR-ABL1 p210 transcripts were detected at a level of 46.479 IS% ratio in bone marrow.  BCR-ABL1 p190 transcripts were detected at a level of 4 in 100,000 cells in bone marrow.    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)     01/24/2020 - 04/02/2020 Chemotherapy    IP/OP LEUKEMIA GRAAPH-2005 + RITUXIMAB < 60 YO  rituximab hypercvad (odd and even course)     02/24/2020 Remission    CR with PCR MRD+; marrow showing 70% blasts with <1% blasts, negative flow MRD, BCR-ABL p210 PCR 0.197%; CNS negative for blasts     03/09/2020 Progression    CSF - rare blast ID'ed - IT chemo given    03/13/20 - IT chemo, CSF negative     04/16/2020 Biopsy    BM Bx with 40-50% cellularity, <1% blasts, MRD flow cytometry negative, BCR-ABL p210 transcripts 0.036%     05/28/2020 - 05/28/2020 Chemotherapy    OP AML - CNS THERAPY (INTRATHECAL CYTARABINE, INTRATHECAL METHOTREXATE, OR INTRATHECAL TRIPLE)  Select one of the following: cytarabine IT 100 mg with hydrocortisone 50 mg, cytarabine IT 40 mg with methotrexate 15 mg with hydrocortisone 50 mg, OR methotrexate IT 12 mg with hydrocortisone 50 mg     06/19/2020 -  Chemotherapy    IP/OP LEUKEMIA BLINATUMOMAB 7-DAY INFUSION (MINIMAL RESIDUAL DISEASE; WT >= 22 KG) (HOME INFUSION)      Cycles 1*-4: Blinatumomab 28 mcg/day Days 1-28 of 6-week cycle.  *Given in the inpatient setting on Days 1-3 on Cycle 1 and Days 1-2 on Cycle 2, while other treatment days are given in the outpatient setting.    Cycle 1 MRD Dosing     07/18/2020 Adverse Reaction    Hospitalization: fevers. Pneumonia     07/23/2020 Biopsy    Diagnosis  Bone marrow, right iliac, aspiration and biopsy  -   Normocellular bone marrow (50%) with trilineage hematopoiesis and 1% blasts by manual aspirate differential  -   Flow cytometry MRD analysis reveals no definitive immunophenotypic evidence of residual B lymphoblastic leukemia   - BCR-ABL p210  0.006%             Medical History:  PCP: Dierdre Harness, MD  Past Medical History:   Diagnosis Date   ??? 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 BONE MARROW BIOPSY & ASPIRATION  01/21/2020        ??? IR INSERT PORT AGE GREATER THAN 5 YRS  03/10/2020    IR INSERT PORT AGE GREATER THAN 5 YRS 03/10/2020 [name], MD IMG VIR H&V [facility]   ??? PR BRONCHOSCOPY,DIAGNOSTIC W LAVAGE Bilateral 07/20/2020    Procedure: BRONCHOSCOPY, RIGID OR FLEXIBLE, INCLUDE FLUOROSCOPIC GUIDANCE WHEN PERFORMED; W/BRONCHIAL ALVEOLAR LAVAGE WITH MODERATE SEDATION;  Surgeon: Dellis Filbert, MD;  Location: BRONCH PROCEDURE LAB [facility];  Service: Pulmonary      Social History:  Social History     Socioeconomic History   ??? Marital status: Married     Spouse name: Not on file   ??? Number of children: Not on file   ??? Years of education: Not on file   ??? Highest education level: Not on file   Occupational History   ??? Not on file   Tobacco Use   ??? Smoking status: Former Smoker     Packs/day: 2.00     Types: Cigarettes     Quit date: 01/20/2020     Years since quitting: 0.5   ??? Smokeless tobacco: Former Neurosurgeon     Types: Musician Use   ??? Vaping Use: Never used   Substance and Sexual Activity   ??? Alcohol use: Not on file   ??? Drug use: Not on file   ??? Sexual activity: Not on file   Other Topics Concern   ??? Do you use sunscreen? No   ??? Tanning bed use? No   ??? Are you easily burned? Yes   ??? Excessive sun exposure? No   ??? Blistering sunburns? Yes   Social History Narrative   ??? Not on file     Social Determinants of Health     Financial Resource Strain: Low Risk    ??? Difficulty of Paying Living Expenses: Not very hard   Food Insecurity: No Food Insecurity   ??? Worried About Running Out of Food in the Last Year: Never true   ??? Ran Out of Food in the Last Year: Never true   Transportation Needs: No Transportation Needs   ??? Lack of Transportation (Medical): No   ??? Lack of Transportation (Non-Medical): No   Physical Activity: Not on file   Stress: Not on file   Social Connections: Not on file     Living situation: the patient lives with his/her family  Functional Status:     ECOG Performance Status: 1 - Restricted in physically strenuous activity but ambulatory and able to carry out work of a light or sedentary nature, e.g., light house work, office work       Family History   Problem Relation Age of Onset   ??? Melanoma Neg Hx    ??? Basal cell carcinoma Neg Hx    ??? Squamous cell carcinoma Neg Hx    No family history of blood cancers or blood disorders reported upon admission.      Allergies: is allergic to bupropion hcl, dapsone, onion, vancomycin analogues, bismuth subsalicylate, ceftaroline fosamil, and furosemide.    Medications:  No current facility-administered medications on  file prior to encounter.     Current Outpatient Medications on File Prior to Encounter   Medication Sig Dispense Refill   ??? acetaminophen (TYLENOL) 325 MG tablet Take 650 mg by mouth every six (6) hours as needed for pain.      ??? amLODIPine (NORVASC) 10 MG tablet Take 1 tablet (10 mg total) by mouth daily. 30 tablet 0   ??? aspirin 81 MG chewable tablet Chew 1 tablet (81 mg total) daily. 36 tablet 11   ??? carvediloL (COREG) 12.5 MG tablet TAKE 1 TABLET BY MOUTH TWO TIMES A DAY. 180 tablet 1   ??? clonazePAM (KLONOPIN) 0.5 MG tablet Take 1 tablet (0.5 mg total) by mouth two (2) times a day as needed for anxiety. 28 tablet 0   ??? gabapentin (NEURONTIN) 300 MG capsule Take 1 capsule (300 mg total) by mouth Three (3) times a day. 90 capsule 0   ??? [EXPIRED] HYDROmorphone (DILAUDID) 2 MG tablet Take 1 tablet (2 mg total) by mouth every four (4) hours as needed for pain,severe (7-10) for up to 14 days. 56 tablet 0   ??? [EXPIRED] hydrOXYzine (ATARAX) 25 MG tablet Take 1 tablet (25 mg total) by mouth every six (6) hours as needed. (Patient not taking: Reported on 07/18/2020) 60 tablet 1   ??? hydrOXYzine (ATARAX) 25 MG tablet Take 25 mg by mouth Three (3) times a day.     ??? isavuconazonium sulfate (CRESEMBA) 186 mg cap capsule Take 2 capsules (372 mg total) by mouth daily. 56 capsule 2   ??? [EXPIRED] linezolid (ZYVOX) 600 mg tablet Take 1 tablet (600 mg total) by mouth every twelve (12) hours for 14 days. 28 tablet 0   ??? nortriptyline (PAMELOR) 50 MG capsule Take 1 capsule (50 mg total) by mouth nightly. 30 capsule 11   ??? [EXPIRED] ondansetron (ZOFRAN) 8 MG tablet Take 1 tablet (8 mg total) by mouth every eight (8) hours as needed for nausea (or itching). 60 tablet 2   ??? oxyCODONE (OXYCONTIN) 15 mg 12 hr crush resistant ER/CR tablet Take 1 tablet (15 mg total) by mouth two (2) times a day. 60 tablet 0   ??? oxyCODONE myristate (XTAMPZA ER) 13.5 mg CSpT Take 13.5 mg by mouth every twelve (12) hours. 60 each 0   ??? PONATinib (ICLUSIG) 30 mg tablet Take 1 tablet (30 mg total) by mouth daily. Swallow tablets whole. Do not crush, break, cut or chew tablets. 30 tablet 5   ??? predniSONE (DELTASONE) 10 MG tablet Take 2 tablets (20 mg total) by mouth daily for 14 days, THEN 1 tablet (10 mg total) daily for 14 days. 42 tablet 0   ??? prochlorperazine (COMPAZINE) 10 MG tablet Take 1 tablet (10 mg total) by mouth every six (6) hours as needed for nausea (if no relief from zofran (ondansetron)). 60 tablet 1   ??? sulfamethoxazole-trimethoprim (BACTRIM DS) 800-160 mg per tablet Take 1 tablet (160 mg of trimethoprim total) by mouth 2 times a day on Saturday, Sunday. For prophylaxis while on chemo. 48 tablet 3   ??? valACYclovir (VALTREX) 500 MG tablet Take 1 tablet (500 mg total) by mouth daily. 90 tablet 3       Objective:   Temp:  [35.7 ??C (96.3 ??F)-36.8 ??C (98.2 ??F)] 36.6 ??C (97.9 ??F)  Heart Rate:  [77-97] 85  Resp:  [18-19] 19  BP: (132-144)/(77-89) 143/88  MAP (mmHg):  [99-105] 105  SpO2:  [95 %-100 %] 98 %  BMI (Calculated):  [29.52]  29.52          Physical Exam:  General: Resting, in no apparent distress   HEENT: PER. No scleral icterus or conjunctival injection. Moist mucous membranes. Oropharhynx without ulceration, erythema, or exudate.   Lymphatics: No lymphadenopathy in the anterior/posterior cervical, supraclavicular, or axillary basins.  CV: Regular rate and rhthym.  S1, S2 normal.  No murmurs, gallops, or rubs. Extremities appear well-perfused.   Resp: Breathing is unlabored, and patient is speaking full sentences with ease. CTAB.    GI: Non-distended, non-tender. BS normoactive x4.  No palpable hepatomegaly or splenomegaly.  No palpable masses.  Skin: Warm, dry, intact. No rashes, petechiae, or purpura.  No areas of skin breakdown.  Musculoskeletal: No grossly-evident joint effusions or deformities.  Range of motion in shoulders, elbows, hips and knees is grossly normal.  Psychiatric: Attentive, calm, cooperative, conversant, euthymic mood, appropriate affect, organized thinking free of delusion, reasonable insight and judgement.  Neurologic: A&Ox4. Gait is normal. CNII-CNXII grossly intact, no focal deficits.   CVAD: R CW Port - nontender, no erythema or exudate; Dressing CDI.      Test Results  Data Review:    All lab results last 24 hours:    Recent Results (from the past 24 hour(s))   Magnesium Level    Collection Time: 08/10/20 10:14 AM   Result Value Ref Range    Magnesium 1.8 1.6 - 2.6 mg/dL   Hepatic function panel    Collection Time: 08/10/20 10:14 AM   Result Value Ref Range    Albumin 4.2 3.4 - 5.0 g/dL    Total Protein 7.4 5.7 - 8.2 g/dL    Total Bilirubin 0.4 0.3 - 1.2 mg/dL    Bilirubin, Direct 4.69 0.00 - 0.30 mg/dL    AST 24 <=62 U/L    ALT 36 10 - 49 U/L    Alkaline Phosphatase 79 46 - 116 U/L   C-reactive protein    Collection Time: 08/10/20 10:14 AM   Result Value Ref Range    CRP 17.0 (H) <=10.0 mg/L   Comprehensive Metabolic Panel    Collection Time: 08/10/20 10:14 AM   Result Value Ref Range    Sodium 134 (L) 135 - 145 mmol/L    Potassium 4.4 3.4 - 4.8 mmol/L    Chloride 105 98 - 107 mmol/L    Anion Gap 7 5 - 14 mmol/L    CO2 22.0 20.0 - 31.0 mmol/L    BUN 27 (H) 9 - 23 mg/dL    Creatinine 9.52 (H) 0.60 - 1.10 mg/dL    BUN/Creatinine Ratio 13     EGFR CKD-EPI Non-African American, Male 37 (L) >=60 mL/min/1.56m2    EGFR CKD-EPI African American, Male 43 (L) >=60 mL/min/1.32m2    Glucose 94 70 - 179 mg/dL    Calcium 9.6 8.7 - 84.1 mg/dL    Albumin 4.2 3.4 - 5.0 g/dL    Total Protein 7.4 5.7 - 8.2 g/dL    Total Bilirubin 0.4 0.3 - 1.2 mg/dL    AST 24 <=32 U/L    ALT 36 10 - 49 U/L    Alkaline Phosphatase 79 46 - 116 U/L   Phosphorus    Collection Time: 08/10/20 10:14 AM   Result Value Ref Range    Phosphorus 5.3 (H) 2.4 - 5.1 mg/dL   CBC w/ Differential    Collection Time: 08/10/20 10:14 AM   Result Value Ref Range    WBC 12.4 (H) 3.6 - 11.2 10*9/L  RBC 3.92 (L) 4.26 - 5.60 10*12/L    HGB 12.7 (L) 12.9 - 16.5 g/dL    HCT 16.1 (L) 09.6 - 48.0 %    MCV 92.0 77.6 - 95.7 fL    MCH 32.3 25.9 - 32.4 pg    MCHC 35.2 32.0 - 36.0 g/dL    RDW 04.5 (H) 40.9 - 15.2 %    MPV 7.7 6.8 - 10.7 fL    Platelet 199 150 - 450 10*9/L    Neutrophils % 77.1 %    Lymphocytes % 16.9 %    Monocytes % 1.3 %    Eosinophils % 3.5 %    Basophils % 1.2 %    Absolute Neutrophils 9.6 (H) 1.8 - 7.8 10*9/L    Absolute Lymphocytes 2.1 1.1 - 3.6 10*9/L    Absolute Monocytes 0.2 (L) 0.3 - 0.8 10*9/L    Absolute Eosinophils 0.4 0.0 - 0.5 10*9/L    Absolute Basophils 0.1 0.0 - 0.1 10*9/L    Anisocytosis Slight (A) Not Present   Basic Metabolic Panel    Collection Time: 08/10/20  2:26 PM   Result Value Ref Range    Sodium 137 135 - 145 mmol/L    Potassium 4.8 3.4 - 4.8 mmol/L    Chloride 106 98 - 107 mmol/L    CO2 25.0 20.0 - 31.0 mmol/L    Anion Gap 6 5 - 14 mmol/L    BUN 30 (H) 9 - 23 mg/dL    Creatinine 8.11 (H) 0.60 - 1.10 mg/dL    BUN/Creatinine Ratio 12     EGFR CKD-EPI Non-African American, Male 30 (L) >=60 mL/min/1.18m2    EGFR CKD-EPI African American, Male 35 (L) >=60 mL/min/1.38m2    Glucose 109 70 - 179 mg/dL    Calcium 9.9 8.7 - 91.4 mg/dL   Phosphorus Level    Collection Time: 08/10/20  2:26 PM   Result Value Ref Range    Phosphorus 5.6 (H) 2.4 - 5.1 mg/dL   CBC w/ Differential    Collection Time: 08/10/20  2:26 PM   Result Value Ref Range    WBC 11.5 (H) 3.6 - 11.2 10*9/L    RBC 3.79 (L) 4.26 - 5.60 10*12/L    HGB 12.4 (L) 12.9 - 16.5 g/dL    HCT 78.2 (L) 95.6 - 48.0 %    MCV 91.1 77.6 - 95.7 fL    MCH 32.9 (H) 25.9 - 32.4 pg    MCHC 36.1 (H) 32.0 - 36.0 g/dL    RDW 21.3 (H) 08.6 - 15.2 %    MPV 7.4 6.8 - 10.7 fL    Platelet 206 150 - 450 10*9/L    Neutrophils % 84.4 %    Lymphocytes % 10.9 %    Monocytes % 1.1 %    Eosinophils % 2.5 %    Basophils % 1.1 %    Absolute Neutrophils 9.7 (H) 1.8 - 7.8 10*9/L    Absolute Lymphocytes 1.3 1.1 - 3.6 10*9/L    Absolute Monocytes 0.1 (L) 0.3 - 0.8 10*9/L    Absolute Eosinophils 0.3 0.0 - 0.5 10*9/L    Absolute Basophils 0.1 0.0 - 0.1 10*9/L    Anisocytosis Slight (A) Not Present   Type and Screen    Collection Time: 08/10/20  5:02 PM   Result Value Ref Range    Blood Type O POS     Select Screen 3 NEG    Basic Metabolic Panel    Collection Time: 08/11/20  5:31 AM   Result Value Ref Range    Sodium 136 135 - 145 mmol/L    Potassium 5.1 (H) 3.4 - 4.8 mmol/L    Chloride 105 98 - 107 mmol/L    CO2 23.0 20.0 - 31.0 mmol/L    Anion Gap 8 5 - 14 mmol/L    BUN 39 (H) 9 - 23 mg/dL    Creatinine 6.96 (H) 0.60 - 1.10 mg/dL    BUN/Creatinine Ratio 17     EGFR CKD-EPI Non-African American, Male 35 (L) >=60 mL/min/1.69m2    EGFR CKD-EPI African American, Male 41 (L) >=60 mL/min/1.107m2    Glucose 132 70 - 179 mg/dL    Calcium 29.5 8.7 - 28.4 mg/dL   C-reactive protein    Collection Time: 08/11/20  5:31 AM   Result Value Ref Range    CRP 20.0 (H) <=10.0 mg/L   Hepatic Function Panel    Collection Time: 08/11/20  5:31 AM   Result Value Ref Range    Albumin 4.1 3.4 - 5.0 g/dL    Total Protein 7.4 5.7 - 8.2 g/dL    Total Bilirubin 0.4 0.3 - 1.2 mg/dL    Bilirubin, Direct 1.32 0.00 - 0.30 mg/dL    AST 26 <=44 U/L    ALT 32 10 - 49 U/L    Alkaline Phosphatase 74 46 - 116 U/L   Magnesium Level    Collection Time: 08/11/20  5:31 AM   Result Value Ref Range    Magnesium 2.2 1.6 - 2.6 mg/dL   Phosphorus Level    Collection Time: 08/11/20  5:31 AM   Result Value Ref Range    Phosphorus 4.7 2.4 - 5.1 mg/dL   CBC w/ Differential    Collection Time: 08/11/20  5:31 AM   Result Value Ref Range    WBC 8.7 3.6 - 11.2 10*9/L    RBC 3.85 (L) 4.26 - 5.60 10*12/L    HGB 12.2 (L) 12.9 - 16.5 g/dL    HCT 01.0 (L) 27.2 - 48.0 %    MCV 92.3 77.6 - 95.7 fL    MCH 31.6 25.9 - 32.4 pg    MCHC 34.3 32.0 - 36.0 g/dL    RDW 53.6 (H) 64.4 - 15.2 %    MPV 7.7 6.8 - 10.7 fL    Platelet 189 150 - 450 10*9/L    Neutrophils % 92.9 %    Lymphocytes % 6.3 %    Monocytes % 0.5 %    Eosinophils % 0.1 %    Basophils % 0.2 %    Absolute Neutrophils 8.1 (H) 1.8 - 7.8 10*9/L    Absolute Lymphocytes 0.6 (L) 1.1 - 3.6 10*9/L    Absolute Monocytes 0.0 (L) 0.3 - 0.8 10*9/L    Absolute Eosinophils 0.0 0.0 - 0.5 10*9/L    Absolute Basophils 0.0 0.0 - 0.1 10*9/L    Anisocytosis Slight (A) Not Present     Imaging: None      Time spent on counseling/coordination of care: 1 Hour  Total time spent with patient: 30 Minutes      Herbert Moors, [facility]  Nurse Practitioner  Hematology/Oncology Department   [facility]   Pager: [phone]    08/11/20

## 2020-08-12 LAB — CBC W/ AUTO DIFF
BASOPHILS ABSOLUTE COUNT: 0 10*9/L (ref 0.0–0.1)
BASOPHILS ABSOLUTE COUNT: 0.1 10*9/L (ref 0.0–0.1)
BASOPHILS RELATIVE PERCENT: 0.4 %
BASOPHILS RELATIVE PERCENT: 0.6 %
EOSINOPHILS ABSOLUTE COUNT: 0.5 10*9/L (ref 0.0–0.5)
EOSINOPHILS ABSOLUTE COUNT: 0.6 10*9/L — ABNORMAL HIGH (ref 0.0–0.5)
EOSINOPHILS RELATIVE PERCENT: 5.9 %
EOSINOPHILS RELATIVE PERCENT: 6.3 %
HEMATOCRIT: 33.1 % — ABNORMAL LOW (ref 39.0–48.0)
HEMATOCRIT: 34.7 % — ABNORMAL LOW (ref 39.0–48.0)
HEMOGLOBIN: 12 g/dL — ABNORMAL LOW (ref 12.9–16.5)
HEMOGLOBIN: 12.2 g/dL — ABNORMAL LOW (ref 12.9–16.5)
LYMPHOCYTES ABSOLUTE COUNT: 1.7 10*9/L (ref 1.1–3.6)
LYMPHOCYTES ABSOLUTE COUNT: 1.7 10*9/L (ref 1.1–3.6)
LYMPHOCYTES RELATIVE PERCENT: 18.7 %
LYMPHOCYTES RELATIVE PERCENT: 19.3 %
MEAN CORPUSCULAR HEMOGLOBIN CONC: 34.6 g/dL (ref 32.0–36.0)
MEAN CORPUSCULAR HEMOGLOBIN CONC: 36.7 g/dL — ABNORMAL HIGH (ref 32.0–36.0)
MEAN CORPUSCULAR HEMOGLOBIN: 31.8 pg (ref 25.9–32.4)
MEAN CORPUSCULAR HEMOGLOBIN: 33 pg — ABNORMAL HIGH (ref 25.9–32.4)
MEAN CORPUSCULAR VOLUME: 89.7 fL (ref 77.6–95.7)
MEAN CORPUSCULAR VOLUME: 92 fL (ref 77.6–95.7)
MEAN PLATELET VOLUME: 7.4 fL (ref 6.8–10.7)
MEAN PLATELET VOLUME: 7.9 fL (ref 6.8–10.7)
MONOCYTES ABSOLUTE COUNT: 0 10*9/L — ABNORMAL LOW (ref 0.3–0.8)
MONOCYTES ABSOLUTE COUNT: 0 10*9/L — ABNORMAL LOW (ref 0.3–0.8)
MONOCYTES RELATIVE PERCENT: 0.4 %
MONOCYTES RELATIVE PERCENT: 0.5 %
NEUTROPHILS ABSOLUTE COUNT: 6.4 10*9/L (ref 1.8–7.8)
NEUTROPHILS ABSOLUTE COUNT: 6.8 10*9/L (ref 1.8–7.8)
NEUTROPHILS RELATIVE PERCENT: 73.9 %
NEUTROPHILS RELATIVE PERCENT: 74 %
PLATELET COUNT: 174 10*9/L (ref 150–450)
PLATELET COUNT: 176 10*9/L (ref 150–450)
RED BLOOD CELL COUNT: 3.69 10*12/L — ABNORMAL LOW (ref 4.26–5.60)
RED BLOOD CELL COUNT: 3.78 10*12/L — ABNORMAL LOW (ref 4.26–5.60)
RED CELL DISTRIBUTION WIDTH: 17.9 % — ABNORMAL HIGH (ref 12.2–15.2)
RED CELL DISTRIBUTION WIDTH: 18.2 % — ABNORMAL HIGH (ref 12.2–15.2)
WBC ADJUSTED: 8.7 10*9/L (ref 3.6–11.2)
WBC ADJUSTED: 9.2 10*9/L (ref 3.6–11.2)

## 2020-08-12 LAB — BASIC METABOLIC PANEL
ANION GAP: 4 mmol/L — ABNORMAL LOW (ref 5–14)
ANION GAP: 5 mmol/L (ref 5–14)
BLOOD UREA NITROGEN: 43 mg/dL — ABNORMAL HIGH (ref 9–23)
BLOOD UREA NITROGEN: 41 mg/dL — ABNORMAL HIGH (ref 9–23)
BUN / CREAT RATIO: 21
BUN / CREAT RATIO: 21
CALCIUM: 9.6 mg/dL (ref 8.7–10.4)
CALCIUM: 9.3 mg/dL (ref 8.7–10.4)
CHLORIDE: 109 mmol/L — ABNORMAL HIGH (ref 98–107)
CHLORIDE: 105 mmol/L (ref 98–107)
CO2: 25 mmol/L (ref 20.0–31.0)
CO2: 25 mmol/L (ref 20.0–31.0)
CREATININE: 2.06 mg/dL — ABNORMAL HIGH
CREATININE: 1.99 mg/dL — ABNORMAL HIGH
EGFR CKD-EPI AA MALE: 45 mL/min/{1.73_m2} — ABNORMAL LOW (ref >=60)
EGFR CKD-EPI AA MALE: 47 mL/min/{1.73_m2} — ABNORMAL LOW (ref >=60)
EGFR CKD-EPI NON-AA MALE: 39 mL/min/{1.73_m2} — ABNORMAL LOW (ref >=60)
EGFR CKD-EPI NON-AA MALE: 41 mL/min/{1.73_m2} — ABNORMAL LOW (ref >=60)
GLUCOSE RANDOM: 98 mg/dL (ref 70–179)
GLUCOSE RANDOM: 95 mg/dL (ref 70–179)
POTASSIUM: 4.4 mmol/L (ref 3.4–4.8)
POTASSIUM: 4.3 mmol/L (ref 3.4–4.8)
SODIUM: 138 mmol/L (ref 135–145)
SODIUM: 135 mmol/L (ref 135–145)

## 2020-08-12 LAB — MAGNESIUM
MAGNESIUM: 1.9 mg/dL (ref 1.6–2.6)
MAGNESIUM: 1.8 mg/dL (ref 1.6–2.6)

## 2020-08-12 LAB — PHOSPHORUS
PHOSPHORUS: 4.6 mg/dL (ref 2.4–5.1)
PHOSPHORUS: 4.7 mg/dL (ref 2.4–5.1)

## 2020-08-12 LAB — C-REACTIVE PROTEIN: C-REACTIVE PROTEIN: 8 mg/L (ref <=10.0)

## 2020-08-12 MED ORDER — ONDANSETRON HCL 8 MG TABLET
ORAL_TABLET | Freq: Three times a day (TID) | ORAL | 2 refills | 20 days | Status: CP | PRN
Start: 2020-08-12 — End: 2020-09-11
  Filled 2020-08-12: qty 60, 20d supply, fill #0

## 2020-08-12 MED ORDER — HYDROMORPHONE 2 MG TABLET
ORAL_TABLET | ORAL | 0 refills | 5 days | Status: CP | PRN
Start: 2020-08-12 — End: 2020-08-17
  Filled 2020-08-12: qty 30, 5d supply, fill #0

## 2020-08-12 MED ADMIN — blinatumomab (BLINCYTO) 28 mcg/day in sodium chloride NON-PVC 0.9 % IVPB (24-HR HOME INFUSION): 28 ug/d | INTRAVENOUS | @ 18:00:00 | Stop: 2020-08-13

## 2020-08-12 MED ADMIN — predniSONE (DELTASONE) tablet 10 mg: 10 mg | ORAL | @ 12:00:00 | Stop: 2020-08-12

## 2020-08-12 MED ADMIN — sodium chloride (NS) 0.9 % flush 10 mL: 10 mL | INTRAVENOUS | @ 01:00:00

## 2020-08-12 MED ADMIN — amLODIPine (NORVASC) tablet 10 mg: 10 mg | ORAL | @ 12:00:00 | Stop: 2020-08-12

## 2020-08-12 MED ADMIN — nortriptyline (PAMELOR) capsule 50 mg: 50 mg | ORAL | @ 01:00:00

## 2020-08-12 MED ADMIN — carvediloL (COREG) tablet 12.5 mg: 12.5 mg | ORAL | @ 01:00:00

## 2020-08-12 MED ADMIN — oxyCODONE (OxyCONTIN) 12 hr crush resistant ER/CR tablet 10 mg: 10 mg | ORAL | @ 01:00:00 | Stop: 2020-08-24

## 2020-08-12 MED ADMIN — HYDROmorphone (DILAUDID) tablet 2 mg: 2 mg | ORAL | @ 09:00:00 | Stop: 2020-08-12

## 2020-08-12 MED ADMIN — aspirin chewable tablet 81 mg: 81 mg | ORAL | @ 12:00:00 | Stop: 2020-08-12

## 2020-08-12 MED ADMIN — gabapentin (NEURONTIN) capsule 300 mg: 300 mg | ORAL | @ 12:00:00 | Stop: 2020-08-12

## 2020-08-12 MED ADMIN — carvediloL (COREG) tablet 12.5 mg: 12.5 mg | ORAL | @ 12:00:00 | Stop: 2020-08-12

## 2020-08-12 MED ADMIN — valACYclovir (VALTREX) tablet 500 mg: 500 mg | ORAL | @ 12:00:00 | Stop: 2020-08-12

## 2020-08-12 MED ADMIN — gabapentin (NEURONTIN) capsule 300 mg: 300 mg | ORAL | @ 01:00:00

## 2020-08-12 MED ADMIN — isavuconazonium sulfate (CRESEMBA) capsule 372 mg: 372 mg | ORAL | @ 12:00:00 | Stop: 2020-08-12

## 2020-08-12 MED ADMIN — oxyCODONE (OxyCONTIN) 12 hr crush resistant ER/CR tablet 10 mg: 10 mg | ORAL | @ 12:00:00 | Stop: 2020-08-12

## 2020-08-12 MED ADMIN — gabapentin (NEURONTIN) capsule 300 mg: 300 mg | ORAL | @ 17:00:00 | Stop: 2020-08-12

## 2020-08-12 NOTE — Unmapped (Signed)
Discharged to infusion with chemo running via port. AVS reviewed, follow up instructions provided.    Problem: Adult Inpatient Plan of Care  Goal: Plan of Care Review  Outcome: Discharged to Home  Goal: Patient-Specific Goal (Individualized)  Outcome: Discharged to Home  Goal: Absence of Hospital-Acquired Illness or Injury  Outcome: Discharged to Home  Intervention: Identify and Manage Fall Risk  Recent Flowsheet Documentation  Taken 08/12/2020 0744 by Ronie Spies, RN  Safety Interventions:   chemotherapeutic agent precautions   fall reduction program maintained   infection management   isolation precautions   lighting adjusted for tasks/safety   low bed   nonskid shoes/slippers when out of bed  Intervention: Prevent and Manage VTE (Venous Thromboembolism) Risk  Recent Flowsheet Documentation  Taken 08/12/2020 0744 by Ronie Spies, RN  Activity Management: activity encouraged  Intervention: Prevent Infection  Recent Flowsheet Documentation  Taken 08/12/2020 0744 by Ronie Spies, RN  Infection Prevention: hand hygiene promoted  Goal: Optimal Comfort and Wellbeing  Outcome: Discharged to Home  Goal: Readiness for Transition of Care  Outcome: Discharged to Home  Goal: Rounds/Family Conference  Outcome: Discharged to Home     Problem: Infection  Goal: Absence of Infection Signs and Symptoms  Outcome: Discharged to Home  Intervention: Prevent or Manage Infection  Recent Flowsheet Documentation  Taken 08/12/2020 0744 by Ronie Spies, RN  Infection Management: aseptic technique maintained     Problem: Impaired Wound Healing  Goal: Optimal Wound Healing  Outcome: Discharged to Home  Intervention: Promote Wound Healing  Recent Flowsheet Documentation  Taken 08/12/2020 0744 by Ronie Spies, RN  Activity Management: activity encouraged

## 2020-08-12 NOTE — Unmapped (Signed)
Patient arrived from inpatient unit. Blina infusion, handoff completed with inpatient RN. Blina bag changed, blood return noted from PORT and patient discharged to home.

## 2020-08-12 NOTE — Unmapped (Signed)
C2D3 of blina. Infused overnight without issue. Dilaudid given x1 for back pain. Will discharge to infusion clinic and then home today.       Problem: Adult Inpatient Plan of Care  Goal: Plan of Care Review  Outcome: Progressing  Goal: Patient-Specific Goal (Individualized)  Outcome: Progressing  Goal: Absence of Hospital-Acquired Illness or Injury  Outcome: Progressing  Intervention: Identify and Manage Fall Risk  Recent Flowsheet Documentation  Taken 08/12/2020 0400 by Rondall Allegra, RN  Safety Interventions:   chemotherapeutic agent precautions   commode/urinal/bedpan at bedside   fall reduction program maintained   lighting adjusted for tasks/safety   low bed  Taken 08/12/2020 0200 by Rondall Allegra, RN  Safety Interventions:   commode/urinal/bedpan at bedside   fall reduction program maintained   lighting adjusted for tasks/safety   low bed  Taken 08/12/2020 0000 by Rondall Allegra, RN  Safety Interventions:   commode/urinal/bedpan at bedside   fall reduction program maintained   lighting adjusted for tasks/safety   low bed  Taken 08/11/2020 2200 by Rondall Allegra, RN  Safety Interventions:   commode/urinal/bedpan at bedside   fall reduction program maintained   lighting adjusted for tasks/safety   low bed  Taken 08/11/2020 2000 by Rondall Allegra, RN  Safety Interventions:   commode/urinal/bedpan at bedside   fall reduction program maintained   low bed   lighting adjusted for tasks/safety   nonskid shoes/slippers when out of bed  Goal: Optimal Comfort and Wellbeing  Outcome: Progressing  Goal: Readiness for Transition of Care  Outcome: Progressing  Goal: Rounds/Family Conference  Outcome: Progressing     Problem: Infection  Goal: Absence of Infection Signs and Symptoms  Outcome: Progressing     Problem: Impaired Wound Healing  Goal: Optimal Wound Healing  Outcome: Progressing

## 2020-08-12 NOTE — Unmapped (Signed)
Physician Discharge Summary [facility]  4 ONC [facility]  [address]  [place] [address]  Dept: [phone]  Loc: [phone]     Identifying Information:   Adam Keith  1980/11/05  [id number]    Primary Care Physician: [name], MD     Referring Physician: [place] Silos Lachiewicz     Code Status: Full Code    Admit Date: 08/10/2020    Discharge Date: 08/12/2020     Discharge To: Home    Discharge Service: [facility] - Hematology APP Floor Team (MEDQ)     Discharge Attending Physician: Francine Graven, MD    Discharge Diagnoses:  Active Problems:    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 *      Outpatient Provider Follow Up Issues:   Supportive Care Recommendations:  We recommend based on the patient???s underlying diagnosis and treatment history the following supportive care:    1. Antimicrobial prophylaxis:  ALL (in remission, receiving post-remission therapy): Bacterial: Levofloxacin 500mg  PO daily (when absolute neutrophils </= 0.5);   Fungal: Cresemba PO daily;   Viral: Valacyclovir 500mg  PO daily (Continuous);   PJP:  SMX/TMP DS PO BID twice weekly (Sat/Sun)    2. Blood product support:  Leukoreduced blood products are required.  Irradiated blood products are preferred, but in case of urgent transfusion needs non-irradiated blood products may be used:     -  RBC transfusion threshold: transfuse 1 units for Hgb < 7 g/dL.  -  Platelet transfusion threshold: transfuse 1 unit of platelets for platelet count < 20, or for bleeding or need for invasive procedure.    Based on the patient's disease status and intensity of therapy, complete blood count with differential should be evaluated 1 times per week and used to guide transfusion support    3. Hematopoietic growth factor support: none    Hospital Course:   Assessment/Plan: Adam 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 Pateient tolerated Cycle 2  well and was without complications.  Patient was discharged on D3.  Additional detail of the hospital course as below.       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 Initially diagnosed in 01/2020. Received GRAAPH induction. Induction course was c/b septic shock, candida krusei fungemia, MRSA bacteremia and septic emobli c/b acute renal failure and respiratory distress requriing dialysis and intubation. Post-induction BMBx demonstrated flow-based MRD-negative remission but low-level BCR-ABL persisted. He is s/p cycle 1 Blinatumomab that was c/b fevers and possibly MRSA pneumonia. He had an LP with IT chemo on 5/6 which was negative for disease. Plan is for one IT treatment per cycle. Next BMBx planned at the conclusion of cycle 5. He presents today for cycle 2 Blinatumomab at 28 mcg daily. C2D1=5/9   -Continue Valtrex, Isavoriconazole ppx on discharge  - Ponatinib 30 mg and ASA 81 mg daily while on Ponatinib both continued on discharge.       Disposition   - 5/11 will have bag hook up in infusion center   - Additional bag changes requested for 5/12, 5/14, 5/16, 5/23, and 5/30  - Follow up in clinic with [name], NP scheduled for 6/8   - LP with IT chemotherapy with Lorella Nimrod, NP scheduled for 6/17      Ok to treat order placed: 5/9  Nurse Navigator: Colletta [place], RN      Other Problems      DRESS: Developed rash previously while on multiple medications. Followed by Dr. Caryn Section in Dermatology. Likely culprit believed to be Dasatinib. Continues on prednisone taper. No rash seen on exam upon admission.   - Prednisone 10 mg daily      HTN: Chronic condition since diagnosis. Home regimen includes Amlodipine and Coreg.   - Amlodipine daily   - Coreg daily      Anxiety/Depression: Chronic condition. Followed by [facility] Psych[facility] and sees local provider for ongoing Psychotherapy. Currently reporting better control since changing his meds during his last admission.   - Continue Nortriptyline 50 mg nightly and  Klonopin 1 mg PO BID PRN, on discharge     Chronic Back Pain - Skin pain/itching: CBP a chronic issue that has been complicated with previous prolonged hospital stays and procedures. The skin pain/itching began with Blinatumomab initiation. Followed by palliative care as an outpatient. Pain currently controlled after recently increasing OxyContin. He reports only needing additional Dilaudid ~1x daily. [place] formulary doesn't include OxyContin 15 mg therefore will try 10 mg BID and will adjust as needed.  Dilaudid 2-4 mg PO Q4HPRN for severe pain, Gabapentin 300 mg TID and OxyContin 10 mg BID will continue regimen on discharge     History of Candidemia: Recently restarted ppx with Isavoriconazole. Followed by [facility] as an outpatient.   - Isavoriconazole ppx daily as noted above      CKD: Arising from AKI and acute renal failure due to sepsis. Now s/p dialysis. His creatnine remains stable but appears to have developed CKD d/t incompletely healed AKI. Cr upon admission is stable at 2.15. Per chart review, his new baseline over the past few months is ~1.9 - 2.2.      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       Immunocompromised status: Patient is immunocompromised secondary to chemotherapy.   - Antimicrobial prophylaxis as above      Procedures:  None  No admission procedures for hospital encounter.  ______________________________________________________________________  Discharge Medications:     Your Medication List      CHANGE how you take these medications    predniSONE 10 MG tablet  Commonly known as: DELTASONE  Take 1 tablet (10 mg total) by mouth daily for 7 days.  Start taking on: Aug 13, 2020  What changed: See the new instructions.        CONTINUE taking these medications    acetaminophen 325 MG tablet  Commonly known as: TYLENOL  Take 650 mg by mouth every six (6) hours as needed for pain.     amLODIPine 10 MG tablet  Commonly known as: NORVASC  Take 1 tablet (10 mg total) by mouth daily.     aspirin 81 MG chewable tablet  Chew 1 tablet (81 mg total) daily.     carvediloL 12.5 MG tablet  Commonly known as: COREG  TAKE 1 TABLET BY MOUTH TWO TIMES A DAY.     clonazePAM 0.5 MG tablet  Commonly known as: KlonoPIN  Take 1 tablet (0.5 mg total) by mouth two (2) times a day as needed for anxiety.     CRESEMBA 186 mg Cap capsule  Generic  drug: isavuconazonium sulfate  Take 2 capsules (372 mg total) by mouth daily.     gabapentin 300 MG capsule  Commonly known as: NEURONTIN  Take 1 capsule (300 mg total) by mouth Three (3) times a day.     HYDROmorphone 2 MG tablet  Commonly known as: DILAUDID  Take 1 tablet (2 mg total) by mouth every four (4) hours as needed for pain,severe (7-10) for up to 5 days.     hydrOXYzine 25 MG tablet  Commonly known as: ATARAX  Take 1 tablet (25 mg total) by mouth every six (6) hours as needed.     ICLUSIG 30 mg tablet  Generic drug: PONATinib  Take 1 tablet (30 mg total) by mouth daily. Swallow tablets whole. Do not crush, break, cut or chew tablets.     nortriptyline 50 MG capsule  Commonly known as: PAMELOR  Take 1 capsule (50 mg total) by mouth nightly.     ondansetron 8 MG tablet  Commonly known as: ZOFRAN  Take 1 tablet (8 mg total) by mouth every eight (8) hours as needed for nausea (or itching).     OxyCONTIN 15 mg 12 hr crush resistant ER/CR tablet  Generic drug: oxyCODONE  Take 1 tablet (15 mg total) by mouth two (2) times a day.     prochlorperazine 10 MG tablet  Commonly known as: COMPAZINE  Take 1 tablet (10 mg total) by mouth every six (6) hours as needed for nausea (if no relief from zofran (ondansetron)).     sulfamethoxazole-trimethoprim 800-160 mg per tablet  Commonly known as: BACTRIM DS  Take 1 tablet (160 mg of trimethoprim total) by mouth 2 times a day on Saturday, Sunday. For prophylaxis while on chemo.     valACYclovir 500 MG tablet  Commonly known as: VALTREX  Take 1 tablet (500 mg total) by mouth daily.            Allergies:  Bupropion hcl, Dapsone, Onion, Vancomycin analogues, Bismuth subsalicylate, Ceftaroline fosamil, and Furosemide  ______________________________________________________________________  Pending Test Results (if blank, then none):      Most Recent Labs:  All lab results last 24 hours -   Recent Results (from the past 24 hour(s))   Basic Metabolic Panel    Collection Time: 08/12/20  6:15 AM   Result Value Ref Range    Sodium 138 135 - 145 mmol/L    Potassium 4.4 3.4 - 4.8 mmol/L    Chloride 109 (H) 98 - 107 mmol/L    CO2 25.0 20.0 - 31.0 mmol/L    Anion Gap 4 (L) 5 - 14 mmol/L    BUN 43 (H) 9 - 23 mg/dL    Creatinine 1.61 (H) 0.60 - 1.10 mg/dL    BUN/Creatinine Ratio 21     EGFR CKD-EPI Non-African American, Male 39 (L) >=60 mL/min/1.52m2    EGFR CKD-EPI African American, Male 45 (L) >=60 mL/min/1.51m2    Glucose 98 70 - 179 mg/dL    Calcium 9.6 8.7 - 09.6 mg/dL   Basic Metabolic Panel    Collection Time: 08/12/20  6:15 AM   Result Value Ref Range    Sodium 135 135 - 145 mmol/L    Potassium 4.3 3.4 - 4.8 mmol/L    Chloride 105 98 - 107 mmol/L    CO2 25.0 20.0 - 31.0 mmol/L    Anion Gap 5 5 - 14 mmol/L    BUN 41 (H) 9 - 23 mg/dL    Creatinine 0.45 (H) 0.60 -  1.10 mg/dL    BUN/Creatinine Ratio 21     EGFR CKD-EPI Non-African American, Male 41 (L) >=60 mL/min/1.27m2    EGFR CKD-EPI African American, Male 2 (L) >=60 mL/min/1.102m2    Glucose 95 70 - 179 mg/dL    Calcium 9.3 8.7 - 56.2 mg/dL   C-reactive protein    Collection Time: 08/12/20  6:15 AM   Result Value Ref Range    CRP 8.0 <=10.0 mg/L   Magnesium Level    Collection Time: 08/12/20  6:15 AM   Result Value Ref Range Magnesium 1.9 1.6 - 2.6 mg/dL   Magnesium Level    Collection Time: 08/12/20  6:15 AM   Result Value Ref Range    Magnesium 1.8 1.6 - 2.6 mg/dL   Phosphorus Level    Collection Time: 08/12/20  6:15 AM   Result Value Ref Range    Phosphorus 4.6 2.4 - 5.1 mg/dL   Phosphorus Level    Collection Time: 08/12/20  6:15 AM   Result Value Ref Range    Phosphorus 4.7 2.4 - 5.1 mg/dL   CBC w/ Differential    Collection Time: 08/12/20  6:15 AM   Result Value Ref Range    WBC 8.7 3.6 - 11.2 10*9/L    RBC 3.69 (L) 4.26 - 5.60 10*12/L    HGB 12.2 (L) 12.9 - 16.5 g/dL    HCT 13.0 (L) 86.5 - 48.0 %    MCV 89.7 77.6 - 95.7 fL    MCH 33.0 (H) 25.9 - 32.4 pg    MCHC 36.7 (H) 32.0 - 36.0 g/dL    RDW 78.4 (H) 69.6 - 15.2 %    MPV 7.4 6.8 - 10.7 fL    Platelet 174 150 - 450 10*9/L    Neutrophils % 73.9 %    Lymphocytes % 19.3 %    Monocytes % 0.5 %    Eosinophils % 5.9 %    Basophils % 0.4 %    Absolute Neutrophils 6.4 1.8 - 7.8 10*9/L    Absolute Lymphocytes 1.7 1.1 - 3.6 10*9/L    Absolute Monocytes 0.0 (L) 0.3 - 0.8 10*9/L    Absolute Eosinophils 0.5 0.0 - 0.5 10*9/L    Absolute Basophils 0.0 0.0 - 0.1 10*9/L    Anisocytosis Slight (A) Not Present   CBC w/ Differential    Collection Time: 08/12/20  6:15 AM   Result Value Ref Range    WBC 9.2 3.6 - 11.2 10*9/L    RBC 3.78 (L) 4.26 - 5.60 10*12/L    HGB 12.0 (L) 12.9 - 16.5 g/dL    HCT 29.5 (L) 28.4 - 48.0 %    MCV 92.0 77.6 - 95.7 fL    MCH 31.8 25.9 - 32.4 pg    MCHC 34.6 32.0 - 36.0 g/dL    RDW 13.2 (H) 44.0 - 15.2 %    MPV 7.9 6.8 - 10.7 fL    Platelet 176 150 - 450 10*9/L    Neutrophils % 74.0 %    Lymphocytes % 18.7 %    Monocytes % 0.4 %    Eosinophils % 6.3 %    Basophils % 0.6 %    Absolute Neutrophils 6.8 1.8 - 7.8 10*9/L    Absolute Lymphocytes 1.7 1.1 - 3.6 10*9/L    Absolute Monocytes 0.0 (L) 0.3 - 0.8 10*9/L    Absolute Eosinophils 0.6 (H) 0.0 - 0.5 10*9/L    Absolute Basophils 0.1 0.0 - 0.1 10*9/L  Anisocytosis Slight (A) Not Present       Relevant Studies/Radiology (if blank, then none):  No results found.  ______________________________________________________________________    Activity Instructions     Activity as tolerated            Diet Instructions     Discharge diet (specify)      Discharge Nutrition Therapy: Regular          Other Instructions     Call MD for:      Fever greater than 100.76F    Call MD for:  difficulty breathing, headache or visual disturbances      Call MD for:  extreme fatigue      Call MD for:  hives      Call MD for:  persistent dizziness or light-headedness      Call MD for:  persistent nausea or vomiting      Call MD for:  redness, tenderness, or signs of infection (pain, swelling, redness, odor or green/yellow discharge around incision site)      Call MD for:  severe uncontrolled pain      Call MD for:  vaginal bleeding saturating more than 1 pad per hour.      Discharge instructions      New/Important Medications:  - Valtrex - helps to prevent shingles  - Bactrim - helps prevent certain types of pneumonia  -Cresemba- helps prevent fungal infections  - Prednisone 10 mg continue until 5/19 unless you hear otherwise from Dermatology on 5/12    Home Health Needs:    - Blincyto infusion    Follow up Appointments:   -  [name] APP on 6/8 at [facility]   -IT chemotherapy with APP Sadiq at [facility] on 6/17    Diagnosis: PH+ B Cell ALL    Course complications:  -  None      When to Call Your [facility] Cancer Care Team:   Monday- Friday from 8:00 am - 5:00 pm   On Nights, Weekends and Holidays   Call [phone]     Call [phone]  Or Toll free [phone]    Ask the operator to page the   Ask to speak to the Triage Nurse  Oncology Fellow on Call     RED ZONE:  Take action now!  You need to be seen right away. Call 911 or go to your nearest hospital for help.   - Symptoms are at a severe level of discomfort    - Bleeding that will not stop  - Chest Pain    - Hard to breathe    - Fall or passing out    - New Seizure    - Thoughts of hurting yourself or others     YELLOW ZONE: Take action today  This is NOT an all-inclusive list. Pleae call with any new or worsening symptoms.   Call your doctor, nurse or other healthcare provider at [phone]  You can be seen by a provider the same day through our Same Day Acute Care for Patients with Cancer program.   - Symptoms are new or worsening; You are not within your goal range for:    - Pain          - Swelling (leg, arm, abdomen, face, neck)    - Shortness of breath       - Skin rash or skin changes    - Bleeding (nose, urine, stool, wound)    - Wound issues (redness, drainage, re-opened)    -  Feeling sick to your stomach and throwing up    - Confusion    - Mouth sores/pain in your mouth or throat    - Vision changes   - Hard stool or very loose stools (increase in ostomy   - Fever >100.4 F, chills   Output)        - Worsening cough with mucus that is green, yellow or bloody   - No urine for 12 hours      - Pain or burning when going to the bathroom    - Feeding tube or other catheter/tube issue    - Home infusion pump issue - call [phone]   - Redness or pain at previous IV or port/catheter site    - Depressed or anxiety     GREEN ZONE: You are in control   Your symptoms are under control. Continue to take your medicine as ordered. Keep all visits to the doctor.   - No increase or worsening symptoms   - Able to take your medicine   - Able to drink and eat     For your safety and best care, please DO NOT use MyChart messages to report red or yellow symptoms.   MyChart messages are only checked during weekday normal business hours and you should receive a   Response within 2 business days.   Please use MyChart only for the follows:   - Non-urgent medication refills, scheduling requests or general questions.           Patient Education:     Your blood counts may continue to drop, so it is good to adhere to the following precautions:    - Wash your hands routinely with soap and water  - Take your temperature when you have chills or are not feeling well  - Use a soft toothbrush  - Avoid constipation or straining with bowel movements. This may mean you occasionally need to take over-the-counter stool softeners or laxatives.   - Avoid people who have colds or the flu, or are not feeling well.  - Wear a mask when visiting crowded places.  - Avoid anyone who has received a live vaccination (shot) within the last three weeks  - Maintain a well-balanced diet and eat healthy foods  - Speak with your doctor before having any dental work done  - Limit exposure to pet feces (e.g., litter box)  - Do only as much activity as you can tolerate    Other instructions:  - Don't use dental floss if your platelet count is below 50,000. Your doctor or nurse should tell you if this is the case.  - Use any mouthwashes given to you as directed.  - If you can't tolerate regular brushing, use an oral swab (bristle-less) toothbrush, or use salt and baking soda to clean your mouth. Mix 1 teaspoon of salt and 1 teaspoon of baking soda into an 8-ounce glass of warm water. Swish and spit.  - Watch your mouth and tongue for white patches. This is a sign of fungal infection, a common side effect of chemotherapy. Be sure to tell your doctor about these patches. Medication/mouthwashes can be prescribed to help you fight the fungal infection.      COVID-19 is a new challenge, but [place] and the [facility] is dedicated to providing you and your loved ones with the best possible cancer care and support in the safest way possible during this time. We made two videos about the  ways we are working to keep you safe, such as offering the option to visit your care team over the phone or through a video, as well as support services offered for our patients and their caregivers. If you have any questions about your cancer care, please call your care team.  ??  Video #1: Keeping [facility] Cancer Care patients safe during the COVID-19 crisis  http://go.eabjmlille.com  ??  Video #2: Support for cancer patients and their caregivers during the COVID-19 pandemic  http://go.SecureGap.uy          Follow Up instructions and Outpatient Referrals     Call MD for:      Call MD for:  difficulty breathing, headache or visual disturbances      Call MD for:  extreme fatigue      Call MD for:  hives      Call MD for:  persistent dizziness or light-headedness      Call MD for:  persistent nausea or vomiting      Call MD for:  redness, tenderness, or signs of infection (pain, swelling,   redness, odor or green/yellow discharge around incision site)      Call MD for:  severe uncontrolled pain      Call MD for:  vaginal bleeding saturating more than 1 pad per hour.      Discharge instructions          Appointments which have been scheduled for you    Aug 12, 2020  1:30 PM  (Arrive by 1:00 PM)  NURSE LAB DRAW with ADULT ONC LAB  [facility] ADULT ONCOLOGY LAB DRAW STATION [place] [facility] REGION) [address]  [place] [address]  [phone]      Aug 12, 2020  2:30 PM  (Arrive by 2:00 PM)  LEVEL 150 with [employer] CHAIR 26  [place] ONCOLOGY INFUSION [facility] REGION) [address]  [place] [address]  [phone]      Aug 13, 2020 11:00 AM  (Arrive by 10:45 AM)  RETURN  GENERAL with Amy Auburn Bilberry, MD  [facility] DERMATOLOGY [address] [place] [facility] REGION) [address] [place] [address]  [phone]      Aug 13, 2020  1:00 PM  (Arrive by 12:30 PM)  NURSE LAB DRAW with ADULT ONC LAB  [facility] ADULT ONCOLOGY LAB DRAW STATION [facility] REGION) [address]  [place] [address]  [phone]      Aug 13, 2020  2:00 PM  (Arrive by 1:30 PM)  LEVEL 150 with [employer] CHAIR 03  [place] ONCOLOGY INFUSION [place] [facility] REGION) [address]  [place] [address]  [phone]      Aug 13, 2020 2:20 PM  (Arrive by 1:50 PM)  NEW NUTRITION with Howie Ill, RD/LDN  [facility] NUTRITION SERVICES CANCER HOSP [place] [facility] REGION) [address]  [place] [address]  [phone]      Aug 15, 2020  1:00 PM  (Arrive by 12:30 PM)  LEVEL 150 with [employer] CHAIR 02   ONCOLOGY INFUSION [facility] REGION) [address]  [place] [address]  [phone]      Aug 17, 2020  1:00 PM  (Arrive by 12:30 PM)  NURSE LAB DRAW with ADULT ONC LAB  [facility] ADULT ONCOLOGY LAB DRAW STATION [place]) [address]  [place] [address]  [phone]      Aug 17, 2020  2:00 PM  (Arrive by 1:30 PM)  LEVEL 150 with [employer] CHAIR 30  [place] ONCOLOGY INFUSION [place] [facility] REGION) [address]  [place] [address]  [phone]      Aug 24, 2020  2:00 PM  (Arrive by 1:30 PM)  LEVEL 150 with [employer] CHAIR 24  [place] ONCOLOGY INFUSION [facility] REGION) [address]  [place] [address]  [phone]      Aug 31, 2020  1:00 PM  (Arrive by 12:30 PM)  LEVEL 150 with [employer] CHAIR 19  [place] ONCOLOGY INFUSION [place] [facility] REGION) [address]  [place] [address]  [phone]      Sep 01, 2020  1:00 PM  (Arrive by 12:30 PM)  RETURN VIDEO - EPIC AMWELL with Ward Givens, PMHNP  [facility] CCSP 2ND FLR CANCER HOSP [facility] REGION) [address]  [place] [address]  [phone]   Please sign into My [place] Chart at least 15 minutes before your appointment to complete any unfinished steps in the Get Started process. Get Started is required to be complete prior to your video visit.     Please visit [email or web address] for information about our safe promise as you receive care at [facility].    My [place] chart allows you to manage your health, send messages to your provider, view your test results, schedule and manage appointments, and request prescription refills securely and conveniently from your computer or mobile device.    You can go to [email or web address] to Sign in to your My [place] Chart account with your username and password. If you have forgotten them, please choose the Forgot Username? and/or Forgot Password? links to gain access. You can also access your My [place] Chart account with the free MyChart mobile app for Android or iPhone.    If you need further assistance with accessing your My [place] Chart account or for assistance in reaching your provider's office to reschedule or cancel your appointment  call [facility] at [phone].         Sep 02, 2020 12:30 PM  (Arrive by 12:00 PM)  RETURN VIDEO - OTHER with Everlena Cooper, MD  [facility] HEMATOLOGY ONCOLOGY 2ND FLR CANCER HOSP [facility] REGION) [address]  [place] [address]  [phone]      Sep 07, 2020  2:00 PM  (Arrive by 1:30 PM)  LEVEL 150 with [employer] CHAIR 41  [place] ONCOLOGY INFUSION [place] [facility] REGION) [address]  [place] [address]  [phone]      Sep 08, 2020  8:00 AM  (Arrive by 7:30 AM)  RETURN ACTIVE [place] with Park Breed, MD  [facility] HEMATOLOGY ONCOLOGY 2ND FLR CANCER HOSP [facility] REGION) [address]  [place] [address]  [phone]      Sep 09, 2020 10:00 AM  (Arrive by 9:30 AM)  LAB ONLY [place] with ADULT ONC LAB  [facility] ADULT ONCOLOGY LAB DRAW STATION [place] [facility] REGION) [address]  [place] [address]  [phone]      Sep 09, 2020 11:00 AM  (Arrive by 10:30 AM)  RETURN ACTIVE [place] with Lenon Ahmadi, AGNP  [place] HEMATOLOGY ONCOLOGY 2ND FLR CANCER HOSP ([place] REGION) [address]  [place] [address]  [phone]      Sep 09, 2020 12:30 PM  (Arrive by 12:00 PM)  RETURN PALLIATIVE CARE with Everlena Cooper, MD  [facility] HEMATOLOGY ONCOLOGY 2ND FLR CANCER HOSP [facility] REGION) [address]  [place] [address]  [phone]      Sep 18, 2020 12:30 PM  (Arrive by 12:00 PM)  NURSE LAB DRAW with ADULT ONC LAB  [facility] ADULT ONCOLOGY LAB DRAW STATION [place] [facility] REGION) [address]  [place] [address]  [phone]      Sep 18, 2020  1:30 PM  (Arrive by 1:00 PM)  LUMBAR PUNCTURE with Vernie Murders, AGNP  [facility] REGION) [address]  [place] [address]  [phone]           ______________________________________________________________________  Discharge Day Services:  BP 132/98  - Pulse 83  - Temp 36.8 ??C (98.3 ??F) (Oral)  - Resp 18  - Ht 194 cm (6' 4.38)  - Wt (!) 111.1 kg (244 lb 14.9 oz)  - SpO2 100%  - BMI 29.52 kg/m??   Pt seen on the day of discharge and determined appropriate for discharge.    Condition at Discharge: good    Length of Discharge: I spent greater than 30 mins in the discharge of this patient.    Vena Rua. Oliva Bustard  Nurse Practitioner  Hematology/Oncology  [facility]    Group pager: [phone]

## 2020-08-12 NOTE — Unmapped (Signed)
Adam Keith is here with ALL getting IV blinatumumab w/o incident. Pt was disconnected for 1hr from infusion between bags. Planning for discharge tomorrow. Pt declines smoking cessation meds. VSS. WCTM

## 2020-08-12 NOTE — Unmapped (Signed)
Admission on 08/10/2020, Discharged on 08/12/2020   Component Date Value Ref Range Status   ??? Sodium 08/10/2020 137  135 - 145 mmol/L Final   ??? Potassium 08/10/2020 4.8  3.4 - 4.8 mmol/L Final   ??? Chloride 08/10/2020 106  98 - 107 mmol/L Final   ??? CO2 08/10/2020 25.0  20.0 - 31.0 mmol/L Final   ??? Anion Gap 08/10/2020 6  5 - 14 mmol/L Final   ??? BUN 08/10/2020 30 (A) 9 - 23 mg/dL Final   ??? Creatinine 08/10/2020 2.52 (A) 0.60 - 1.10 mg/dL Final   ??? BUN/Creatinine Ratio 08/10/2020 12   Final   ??? EGFR CKD-EPI Non-African American,* 08/10/2020 30 (A) >=60 mL/min/1.18m2 Final   ??? EGFR CKD-EPI African American, Male 08/10/2020 35 (A) >=60 mL/min/1.36m2 Final   ??? Glucose 08/10/2020 109  70 - 179 mg/dL Final   ??? Calcium 09/81/1914 9.9  8.7 - 10.4 mg/dL Final   ??? Phosphorus 08/10/2020 5.6 (A) 2.4 - 5.1 mg/dL Final   ??? WBC 78/29/5621 11.5 (A) 3.6 - 11.2 10*9/L Final   ??? RBC 08/10/2020 3.79 (A) 4.26 - 5.60 10*12/L Final   ??? HGB 08/10/2020 12.4 (A) 12.9 - 16.5 g/dL Final   ??? HCT 30/86/5784 34.5 (A) 39.0 - 48.0 % Final   ??? MCV 08/10/2020 91.1  77.6 - 95.7 fL Final   ??? MCH 08/10/2020 32.9 (A) 25.9 - 32.4 pg Final   ??? MCHC 08/10/2020 36.1 (A) 32.0 - 36.0 g/dL Final   ??? RDW 69/62/9528 18.6 (A) 12.2 - 15.2 % Final   ??? MPV 08/10/2020 7.4  6.8 - 10.7 fL Final   ??? Platelet 08/10/2020 206  150 - 450 10*9/L Final   ??? Neutrophils % 08/10/2020 84.4  % Final   ??? Lymphocytes % 08/10/2020 10.9  % Final   ??? Monocytes % 08/10/2020 1.1  % Final   ??? Eosinophils % 08/10/2020 2.5  % Final   ??? Basophils % 08/10/2020 1.1  % Final   ??? Absolute Neutrophils 08/10/2020 9.7 (A) 1.8 - 7.8 10*9/L Final   ??? Absolute Lymphocytes 08/10/2020 1.3  1.1 - 3.6 10*9/L Final   ??? Absolute Monocytes 08/10/2020 0.1 (A) 0.3 - 0.8 10*9/L Final   ??? Absolute Eosinophils 08/10/2020 0.3  0.0 - 0.5 10*9/L Final   ??? Absolute Basophils 08/10/2020 0.1  0.0 - 0.1 10*9/L Final   ??? Anisocytosis 08/10/2020 Slight (A) Not Present Final   ??? Blood Type 08/10/2020 O POS   Final   ??? Select Screen 3 08/10/2020 NEG   Final    Patient has a history of red cell antibodies   ??? Sodium 08/11/2020 136  135 - 145 mmol/L Final   ??? Potassium 08/11/2020 5.1 (A) 3.4 - 4.8 mmol/L Final   ??? Chloride 08/11/2020 105  98 - 107 mmol/L Final   ??? CO2 08/11/2020 23.0  20.0 - 31.0 mmol/L Final   ??? Anion Gap 08/11/2020 8  5 - 14 mmol/L Final   ??? BUN 08/11/2020 39 (A) 9 - 23 mg/dL Final   ??? Creatinine 08/11/2020 2.23 (A) 0.60 - 1.10 mg/dL Final   ??? BUN/Creatinine Ratio 08/11/2020 17   Final   ??? EGFR CKD-EPI Non-African American,* 08/11/2020 35 (A) >=60 mL/min/1.39m2 Final   ??? EGFR CKD-EPI African American, Male 08/11/2020 41 (A) >=60 mL/min/1.74m2 Final   ??? Glucose 08/11/2020 132  70 - 179 mg/dL Final   ??? Calcium 41/32/4401 10.2  8.7 - 10.4 mg/dL Final   ??? CRP  08/11/2020 20.0 (A) <=10.0 mg/L Final   ??? Albumin 08/11/2020 4.1  3.4 - 5.0 g/dL Final   ??? Total Protein 08/11/2020 7.4  5.7 - 8.2 g/dL Final   ??? Total Bilirubin 08/11/2020 0.4  0.3 - 1.2 mg/dL Final   ??? Bilirubin, Direct 08/11/2020 0.10  0.00 - 0.30 mg/dL Final   ??? AST 81/19/1478 26  <=34 U/L Final   ??? ALT 08/11/2020 32  10 - 49 U/L Final   ??? Alkaline Phosphatase 08/11/2020 74  46 - 116 U/L Final   ??? Magnesium 08/11/2020 2.2  1.6 - 2.6 mg/dL Final   ??? Phosphorus 08/11/2020 4.7  2.4 - 5.1 mg/dL Final   ??? WBC 29/56/2130 8.7  3.6 - 11.2 10*9/L Final   ??? RBC 08/11/2020 3.85 (A) 4.26 - 5.60 10*12/L Final   ??? HGB 08/11/2020 12.2 (A) 12.9 - 16.5 g/dL Final   ??? HCT 86/57/8469 35.5 (A) 39.0 - 48.0 % Final   ??? MCV 08/11/2020 92.3  77.6 - 95.7 fL Final   ??? MCH 08/11/2020 31.6  25.9 - 32.4 pg Final   ??? MCHC 08/11/2020 34.3  32.0 - 36.0 g/dL Final   ??? RDW 62/95/2841 18.4 (A) 12.2 - 15.2 % Final   ??? MPV 08/11/2020 7.7  6.8 - 10.7 fL Final   ??? Platelet 08/11/2020 189  150 - 450 10*9/L Final   ??? Neutrophils % 08/11/2020 92.9  % Final   ??? Lymphocytes % 08/11/2020 6.3  % Final   ??? Monocytes % 08/11/2020 0.5  % Final   ??? Eosinophils % 08/11/2020 0.1  % Final   ??? Basophils % 08/11/2020 0.2  % Final   ??? Absolute Neutrophils 08/11/2020 8.1 (A) 1.8 - 7.8 10*9/L Final   ??? Absolute Lymphocytes 08/11/2020 0.6 (A) 1.1 - 3.6 10*9/L Final   ??? Absolute Monocytes 08/11/2020 0.0 (A) 0.3 - 0.8 10*9/L Final   ??? Absolute Eosinophils 08/11/2020 0.0  0.0 - 0.5 10*9/L Final   ??? Absolute Basophils 08/11/2020 0.0  0.0 - 0.1 10*9/L Final   ??? Anisocytosis 08/11/2020 Slight (A) Not Present Final   ??? Sodium 08/12/2020 138  135 - 145 mmol/L Final   ??? Potassium 08/12/2020 4.4  3.4 - 4.8 mmol/L Final   ??? Chloride 08/12/2020 109 (A) 98 - 107 mmol/L Final   ??? CO2 08/12/2020 25.0  20.0 - 31.0 mmol/L Final   ??? Anion Gap 08/12/2020 4 (A) 5 - 14 mmol/L Final   ??? BUN 08/12/2020 43 (A) 9 - 23 mg/dL Final   ??? Creatinine 08/12/2020 2.06 (A) 0.60 - 1.10 mg/dL Final   ??? BUN/Creatinine Ratio 08/12/2020 21   Final   ??? EGFR CKD-EPI Non-African American,* 08/12/2020 39 (A) >=60 mL/min/1.59m2 Final   ??? EGFR CKD-EPI African American, Male 08/12/2020 45 (A) >=60 mL/min/1.65m2 Final   ??? Glucose 08/12/2020 98  70 - 179 mg/dL Final   ??? Calcium 32/44/0102 9.6  8.7 - 10.4 mg/dL Final   ??? Sodium 72/53/6644 135  135 - 145 mmol/L Final   ??? Potassium 08/12/2020 4.3  3.4 - 4.8 mmol/L Final   ??? Chloride 08/12/2020 105  98 - 107 mmol/L Final   ??? CO2 08/12/2020 25.0  20.0 - 31.0 mmol/L Final   ??? Anion Gap 08/12/2020 5  5 - 14 mmol/L Final   ??? BUN 08/12/2020 41 (A) 9 - 23 mg/dL Final   ??? Creatinine 08/12/2020 1.99 (A) 0.60 - 1.10 mg/dL Final   ??? BUN/Creatinine Ratio 08/12/2020 21  Final   ??? EGFR CKD-EPI Non-African American,* 08/12/2020 41 (A) >=60 mL/min/1.2m2 Final   ??? EGFR CKD-EPI African American, Male 08/12/2020 47 (A) >=60 mL/min/1.41m2 Final   ??? Glucose 08/12/2020 95  70 - 179 mg/dL Final   ??? Calcium 16/01/9603 9.3  8.7 - 10.4 mg/dL Final   ??? CRP 54/12/8117 8.0  <=10.0 mg/L Final   ??? Magnesium 08/12/2020 1.9  1.6 - 2.6 mg/dL Final   ??? Magnesium 14/78/2956 1.8  1.6 - 2.6 mg/dL Final   ??? Phosphorus 08/12/2020 4.6  2.4 - 5.1 mg/dL Final ??? Phosphorus 21/30/8657 4.7  2.4 - 5.1 mg/dL Final   ??? WBC 84/69/6295 8.7  3.6 - 11.2 10*9/L Final   ??? RBC 08/12/2020 3.69 (A) 4.26 - 5.60 10*12/L Final   ??? HGB 08/12/2020 12.2 (A) 12.9 - 16.5 g/dL Final   ??? HCT 28/41/3244 33.1 (A) 39.0 - 48.0 % Final   ??? MCV 08/12/2020 89.7  77.6 - 95.7 fL Final   ??? MCH 08/12/2020 33.0 (A) 25.9 - 32.4 pg Final   ??? MCHC 08/12/2020 36.7 (A) 32.0 - 36.0 g/dL Final   ??? RDW 04/06/7251 18.2 (A) 12.2 - 15.2 % Final   ??? MPV 08/12/2020 7.4  6.8 - 10.7 fL Final   ??? Platelet 08/12/2020 174  150 - 450 10*9/L Final   ??? Neutrophils % 08/12/2020 73.9  % Final   ??? Lymphocytes % 08/12/2020 19.3  % Final   ??? Monocytes % 08/12/2020 0.5  % Final   ??? Eosinophils % 08/12/2020 5.9  % Final   ??? Basophils % 08/12/2020 0.4  % Final   ??? Absolute Neutrophils 08/12/2020 6.4  1.8 - 7.8 10*9/L Final   ??? Absolute Lymphocytes 08/12/2020 1.7  1.1 - 3.6 10*9/L Final   ??? Absolute Monocytes 08/12/2020 0.0 (A) 0.3 - 0.8 10*9/L Final   ??? Absolute Eosinophils 08/12/2020 0.5  0.0 - 0.5 10*9/L Final   ??? Absolute Basophils 08/12/2020 0.0  0.0 - 0.1 10*9/L Final   ??? Anisocytosis 08/12/2020 Slight (A) Not Present Final   ??? WBC 08/12/2020 9.2  3.6 - 11.2 10*9/L Final   ??? RBC 08/12/2020 3.78 (A) 4.26 - 5.60 10*12/L Final   ??? HGB 08/12/2020 12.0 (A) 12.9 - 16.5 g/dL Final   ??? HCT 66/44/0347 34.7 (A) 39.0 - 48.0 % Final   ??? MCV 08/12/2020 92.0  77.6 - 95.7 fL Final   ??? MCH 08/12/2020 31.8  25.9 - 32.4 pg Final   ??? MCHC 08/12/2020 34.6  32.0 - 36.0 g/dL Final   ??? RDW 42/59/5638 17.9 (A) 12.2 - 15.2 % Final   ??? MPV 08/12/2020 7.9  6.8 - 10.7 fL Final   ??? Platelet 08/12/2020 176  150 - 450 10*9/L Final   ??? Neutrophils % 08/12/2020 74.0  % Final   ??? Lymphocytes % 08/12/2020 18.7  % Final   ??? Monocytes % 08/12/2020 0.4  % Final   ??? Eosinophils % 08/12/2020 6.3  % Final   ??? Basophils % 08/12/2020 0.6  % Final   ??? Absolute Neutrophils 08/12/2020 6.8  1.8 - 7.8 10*9/L Final   ??? Absolute Lymphocytes 08/12/2020 1.7  1.1 - 3.6 10*9/L Final   ??? Absolute Monocytes 08/12/2020 0.0 (A) 0.3 - 0.8 10*9/L Final   ??? Absolute Eosinophils 08/12/2020 0.6 (A) 0.0 - 0.5 10*9/L Final   ??? Absolute Basophils 08/12/2020 0.1  0.0 - 0.1 10*9/L Final   ??? Anisocytosis 08/12/2020 Slight (A) Not Present Final

## 2020-08-13 ENCOUNTER — Ambulatory Visit: Admit: 2020-08-13 | Discharge: 2020-08-14 | Payer: MEDICAID | Attending: Registered" | Primary: Registered"

## 2020-08-13 ENCOUNTER — Ambulatory Visit: Admit: 2020-08-13 | Discharge: 2020-08-14 | Payer: MEDICAID

## 2020-08-13 ENCOUNTER — Ambulatory Visit: Admit: 2020-08-13 | Discharge: 2020-08-14 | Payer: MEDICAID | Attending: Dermatology | Primary: Dermatology

## 2020-08-13 DIAGNOSIS — T50905A Adverse effect of unspecified drugs, medicaments and biological substances, initial encounter: Principal | ICD-10-CM

## 2020-08-13 DIAGNOSIS — D7212 DRESS syndrome: Principal | ICD-10-CM

## 2020-08-13 DIAGNOSIS — C9100 Acute lymphoblastic leukemia not having achieved remission: Principal | ICD-10-CM

## 2020-08-13 LAB — CBC W/ AUTO DIFF
BASOPHILS ABSOLUTE COUNT: 0.1 10*9/L (ref 0.0–0.1)
BASOPHILS RELATIVE PERCENT: 1.8 %
EOSINOPHILS ABSOLUTE COUNT: 0.4 10*9/L (ref 0.0–0.5)
EOSINOPHILS RELATIVE PERCENT: 9.2 %
HEMATOCRIT: 33.3 % — ABNORMAL LOW (ref 39.0–48.0)
HEMOGLOBIN: 11.8 g/dL — ABNORMAL LOW (ref 12.9–16.5)
LYMPHOCYTES ABSOLUTE COUNT: 1.1 10*9/L (ref 1.1–3.6)
LYMPHOCYTES RELATIVE PERCENT: 27.7 %
MEAN CORPUSCULAR HEMOGLOBIN CONC: 35.5 g/dL (ref 32.0–36.0)
MEAN CORPUSCULAR HEMOGLOBIN: 32.3 pg (ref 25.9–32.4)
MEAN CORPUSCULAR VOLUME: 91 fL (ref 77.6–95.7)
MEAN PLATELET VOLUME: 7.3 fL (ref 6.8–10.7)
MONOCYTES ABSOLUTE COUNT: 0 10*9/L — ABNORMAL LOW (ref 0.3–0.8)
MONOCYTES RELATIVE PERCENT: 1 %
NEUTROPHILS ABSOLUTE COUNT: 2.5 10*9/L (ref 1.8–7.8)
NEUTROPHILS RELATIVE PERCENT: 60.3 %
PLATELET COUNT: 162 10*9/L (ref 150–450)
RED BLOOD CELL COUNT: 3.66 10*12/L — ABNORMAL LOW (ref 4.26–5.60)
RED CELL DISTRIBUTION WIDTH: 17.9 % — ABNORMAL HIGH (ref 12.2–15.2)
WBC ADJUSTED: 4.1 10*9/L (ref 3.6–11.2)

## 2020-08-13 LAB — COMPREHENSIVE METABOLIC PANEL
ALBUMIN: 3.9 g/dL (ref 3.4–5.0)
ALKALINE PHOSPHATASE: 65 U/L (ref 46–116)
ALT (SGPT): 18 U/L (ref 10–49)
ANION GAP: 6 mmol/L (ref 5–14)
AST (SGOT): 16 U/L (ref <=34)
BILIRUBIN TOTAL: 0.4 mg/dL (ref 0.3–1.2)
BLOOD UREA NITROGEN: 28 mg/dL — ABNORMAL HIGH (ref 9–23)
BUN / CREAT RATIO: 14
CALCIUM: 9 mg/dL (ref 8.7–10.4)
CHLORIDE: 108 mmol/L — ABNORMAL HIGH (ref 98–107)
CO2: 23 mmol/L (ref 20.0–31.0)
CREATININE: 1.99 mg/dL — ABNORMAL HIGH
EGFR CKD-EPI AA MALE: 47 mL/min/{1.73_m2} — ABNORMAL LOW (ref >=60)
EGFR CKD-EPI NON-AA MALE: 41 mL/min/{1.73_m2} — ABNORMAL LOW (ref >=60)
GLUCOSE RANDOM: 93 mg/dL (ref 70–179)
POTASSIUM: 4.7 mmol/L (ref 3.4–4.8)
PROTEIN TOTAL: 6.6 g/dL (ref 5.7–8.2)
SODIUM: 137 mmol/L (ref 135–145)

## 2020-08-13 LAB — PHOSPHORUS: PHOSPHORUS: 3.6 mg/dL (ref 2.4–5.1)

## 2020-08-13 LAB — MAGNESIUM: MAGNESIUM: 1.7 mg/dL (ref 1.6–2.6)

## 2020-08-13 MED ORDER — PREDNISONE 10 MG TABLET
ORAL_TABLET | Freq: Every day | ORAL | 0 refills | 40.00000 days
Start: 2020-08-13 — End: 2020-09-10

## 2020-08-13 MED ADMIN — blinatumomab (BLINCYTO) 28 mcg/day in sodium chloride NON-PVC 0.9 % IVPB (48-HR HOME INFUSION): 28 ug/d | INTRAVENOUS | @ 19:00:00 | Stop: 2020-08-15

## 2020-08-13 NOTE — Unmapped (Signed)
Patient arrived to chair 40 for Blinatumumab bag change.  No complaints noted. Port intact with blood return. Labs drawn and sent. CADD pump connected. All clamps opened, green light flashing, pump reads running, and home supplies sent home with patient. AVS declined. Patient discharged to home, NAD.

## 2020-08-13 NOTE — Unmapped (Signed)
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 Possible drug culprits include ceftaroline, dapsone, vancomycin, isavuconazole. All have been added to patient's allergy list   - biopsy 3/15 demonstrates mild spongiosis with superficial dermal perivascular lymphoeosinophilic infiltrate, consistent with eczematous drug reaction  - Last bloodwork 08/12/2020 shows eosinophils 0.5, elevated from 0.0 when he was on prednisone 20 mg. Creatinine elevated at baseline ~2.1. LFT's normal.   - We will continue to correspond with patient's oncology team given history of MRSA infection and need to balance risks of prednisone immunosuppression with need for treatment for his rash  - Continue prednisone, will increase to 15 mg and maintain at this dose for 4 weeks, consider taper to 10 mg at next visit if cleared. He will let us know in 1 week if he is continuing to flare, at which point we will increase to 20 mg.   - continue hydroxyzine 25 mg as needed for itching  - will check HHV-6 and EBV PCR, ordered to be collected with his next labs     The patient was advised to call for an appointment should any new, changing ,or symptomatic lesion develop.     RTC: Return in about 4 weeks (around 09/10/2020) for DRESS. or sooner as needed   ___________________________________________________________________    Referring Provider: Danton Clap, MD     Chief Complaint     Chief Complaint   Patient presents with   ??? Rash     patient reports flare up since prednisone dose change     HPI     Adam Keith is a pleasant 40 y.o. male, who returns for follow-up of suspected drug induced hypersensitivity syndrome. At last visit in March, a biopsy was done which demonstrated spongiotic eczematous process. He was clear on prednisone 20 mg, but then flared with taper to 10 mg. He is not using any topical medicines.Recently admitted for resumption of chemotherapy. Overall is feeling ok and getting over recent MRSA pneumonia. Feels like energy levels are improving. Denies, fever, abdominal pain, diarrhea or constipation.    The patient denies any other new or changing lesions or areas of concern.     Pertinent Past Medical History     B-cell ALL     Past Medical History, Family History, Social History , Med List, Allergies, Problem List reviewed in the rooming section of Epic     ROS: Other than symptoms mentioned in the HPI, endorses muscle and joint aches and diffuse itching, otherwise ROS negative    Physical Examination     General: Well-appearing male, anxious appearing and shivering  Neuro: Alert and oriented, answers questions appropriately  Full Skin Exam: Examination of the scalp, face, eyelids, lips, nose, ears, neck, chest, abdomen, back, arms, legs, hands, feet, palms, soles, nails, buttocks was performed.  Skin examination was notable for the following:  - Confluent monomorphic erythematous papules and plaques on trunk, BUE, BLE and periorbital region with edema of the upper extremities    All areas not commented on are within normal limits or unremarkable.

## 2020-08-13 NOTE — Unmapped (Signed)
[facility] releases most results to you as soon as they are available. Therefore, you may see some results before we do. Please give us 2 business days to review the tests and contact you by phone or through MyChart. If you are concerned that some results may be upsetting or confusing, you may wish to wait until we contact you before looking at the report in MyChart.   If you have an urgent question, you can send us a message or call our clinic. Otherwise, we prefer that you wait 2 business days for us to contact you.     Dermatology

## 2020-08-15 ENCOUNTER — Ambulatory Visit: Admit: 2020-08-15 | Discharge: 2020-08-16 | Payer: MEDICAID

## 2020-08-15 LAB — CBC W/ AUTO DIFF
BASOPHILS ABSOLUTE COUNT: 0 10*9/L (ref 0.0–0.1)
BASOPHILS RELATIVE PERCENT: 0.5 %
EOSINOPHILS ABSOLUTE COUNT: 0.5 10*9/L (ref 0.0–0.5)
EOSINOPHILS RELATIVE PERCENT: 10.4 %
HEMATOCRIT: 32.9 % — ABNORMAL LOW (ref 39.0–48.0)
HEMOGLOBIN: 11.5 g/dL — ABNORMAL LOW (ref 12.9–16.5)
LYMPHOCYTES ABSOLUTE COUNT: 0.9 10*9/L — ABNORMAL LOW (ref 1.1–3.6)
LYMPHOCYTES RELATIVE PERCENT: 16.7 %
MEAN CORPUSCULAR HEMOGLOBIN CONC: 35 g/dL (ref 32.0–36.0)
MEAN CORPUSCULAR HEMOGLOBIN: 32 pg (ref 25.9–32.4)
MEAN CORPUSCULAR VOLUME: 91.5 fL (ref 77.6–95.7)
MEAN PLATELET VOLUME: 7.1 fL (ref 6.8–10.7)
MONOCYTES ABSOLUTE COUNT: 0.1 10*9/L — ABNORMAL LOW (ref 0.3–0.8)
MONOCYTES RELATIVE PERCENT: 1.1 %
NEUTROPHILS ABSOLUTE COUNT: 3.7 10*9/L (ref 1.8–7.8)
NEUTROPHILS RELATIVE PERCENT: 71.3 %
PLATELET COUNT: 122 10*9/L — ABNORMAL LOW (ref 150–450)
RED BLOOD CELL COUNT: 3.6 10*12/L — ABNORMAL LOW (ref 4.26–5.60)
RED CELL DISTRIBUTION WIDTH: 17.6 % — ABNORMAL HIGH (ref 12.2–15.2)
WBC ADJUSTED: 5.2 10*9/L (ref 3.6–11.2)

## 2020-08-15 LAB — PHOSPHORUS: PHOSPHORUS: 2.9 mg/dL (ref 2.4–5.1)

## 2020-08-15 LAB — COMPREHENSIVE METABOLIC PANEL
ALBUMIN: 3.8 g/dL (ref 3.4–5.0)
ALKALINE PHOSPHATASE: 60 U/L (ref 46–116)
ALT (SGPT): 19 U/L (ref 10–49)
ANION GAP: 4 mmol/L — ABNORMAL LOW (ref 5–14)
AST (SGOT): 15 U/L (ref <=34)
BILIRUBIN TOTAL: 0.4 mg/dL (ref 0.3–1.2)
BLOOD UREA NITROGEN: 28 mg/dL — ABNORMAL HIGH (ref 9–23)
BUN / CREAT RATIO: 15
CALCIUM: 9.1 mg/dL (ref 8.7–10.4)
CHLORIDE: 107 mmol/L (ref 98–107)
CO2: 23 mmol/L (ref 20.0–31.0)
CREATININE: 1.81 mg/dL — ABNORMAL HIGH
EGFR CKD-EPI AA MALE: 53 mL/min/{1.73_m2} — ABNORMAL LOW (ref >=60)
EGFR CKD-EPI NON-AA MALE: 45 mL/min/{1.73_m2} — ABNORMAL LOW (ref >=60)
GLUCOSE RANDOM: 95 mg/dL (ref 70–179)
POTASSIUM: 4.2 mmol/L (ref 3.4–4.8)
PROTEIN TOTAL: 6.7 g/dL (ref 5.7–8.2)
SODIUM: 134 mmol/L — ABNORMAL LOW (ref 135–145)

## 2020-08-15 LAB — MAGNESIUM: MAGNESIUM: 1.7 mg/dL (ref 1.6–2.6)

## 2020-08-15 MED ADMIN — blinatumomab (BLINCYTO) 28 mcg/day in sodium chloride NON-PVC 0.9 % IVPB (48-HR HOME INFUSION): 28 ug/d | INTRAVENOUS | @ 17:00:00 | Stop: 2020-08-17

## 2020-08-15 NOTE — Unmapped (Signed)
1330: Patient present for Blinatumomab bag change, no acute concerns reported. Labs due but no treatment parameters. 20 mL blood wasted; labs drawn and sent for analysis. New bag connected, pump settings checked with 2nd RN. Patient discharged home alert, oriented and in stable condition with family present.

## 2020-08-17 ENCOUNTER — Other Ambulatory Visit: Admit: 2020-08-17 | Discharge: 2020-08-18 | Payer: MEDICAID

## 2020-08-17 ENCOUNTER — Ambulatory Visit: Admit: 2020-08-17 | Discharge: 2020-08-18 | Payer: MEDICAID

## 2020-08-17 DIAGNOSIS — C9100 Acute lymphoblastic leukemia not having achieved remission: Principal | ICD-10-CM

## 2020-08-17 LAB — CBC W/ AUTO DIFF
BASOPHILS ABSOLUTE COUNT: 0 10*9/L (ref 0.0–0.1)
BASOPHILS RELATIVE PERCENT: 0.4 %
EOSINOPHILS ABSOLUTE COUNT: 0.4 10*9/L (ref 0.0–0.5)
EOSINOPHILS RELATIVE PERCENT: 4.6 %
HEMATOCRIT: 30.2 % — ABNORMAL LOW (ref 39.0–48.0)
HEMOGLOBIN: 10.8 g/dL — ABNORMAL LOW (ref 12.9–16.5)
LYMPHOCYTES ABSOLUTE COUNT: 1.5 10*9/L (ref 1.1–3.6)
LYMPHOCYTES RELATIVE PERCENT: 15.7 %
MEAN CORPUSCULAR HEMOGLOBIN CONC: 35.9 g/dL (ref 32.0–36.0)
MEAN CORPUSCULAR HEMOGLOBIN: 32.7 pg — ABNORMAL HIGH (ref 25.9–32.4)
MEAN CORPUSCULAR VOLUME: 91.3 fL (ref 77.6–95.7)
MONOCYTES ABSOLUTE COUNT: 0.2 10*9/L — ABNORMAL LOW (ref 0.3–0.8)
MONOCYTES RELATIVE PERCENT: 2.6 %
NEUTROPHILS ABSOLUTE COUNT: 7.2 10*9/L (ref 1.8–7.8)
NEUTROPHILS RELATIVE PERCENT: 76.7 %
PLATELET COUNT: >90 10*9/L — ABNORMAL LOW (ref 150–450)
RED BLOOD CELL COUNT: 3.3 10*12/L — ABNORMAL LOW (ref 4.26–5.60)
RED CELL DISTRIBUTION WIDTH: 17.8 % — ABNORMAL HIGH (ref 12.2–15.2)
WBC ADJUSTED: 9.4 10*9/L (ref 3.6–11.2)

## 2020-08-17 LAB — COMPREHENSIVE METABOLIC PANEL
ALBUMIN: 4.2 g/dL (ref 3.4–5.0)
ALKALINE PHOSPHATASE: 69 U/L (ref 46–116)
ALT (SGPT): 20 U/L (ref 10–49)
ANION GAP: 7 mmol/L (ref 5–14)
AST (SGOT): 18 U/L (ref <=34)
BILIRUBIN TOTAL: 0.2 mg/dL — ABNORMAL LOW (ref 0.3–1.2)
BLOOD UREA NITROGEN: 19 mg/dL (ref 9–23)
BUN / CREAT RATIO: 9
CALCIUM: 8.9 mg/dL (ref 8.7–10.4)
CHLORIDE: 110 mmol/L — ABNORMAL HIGH (ref 98–107)
CO2: 20 mmol/L (ref 20.0–31.0)
CREATININE: 2.05 mg/dL — ABNORMAL HIGH
EGFR CKD-EPI AA MALE: 45 mL/min/{1.73_m2} — ABNORMAL LOW (ref >=60)
EGFR CKD-EPI NON-AA MALE: 39 mL/min/{1.73_m2} — ABNORMAL LOW (ref >=60)
GLUCOSE RANDOM: 109 mg/dL (ref 70–179)
POTASSIUM: 4 mmol/L (ref 3.4–4.8)
PROTEIN TOTAL: 6.6 g/dL (ref 5.7–8.2)
SODIUM: 137 mmol/L (ref 135–145)

## 2020-08-17 LAB — PHOSPHORUS: PHOSPHORUS: 2.3 mg/dL — ABNORMAL LOW (ref 2.4–5.1)

## 2020-08-17 LAB — SLIDE REVIEW

## 2020-08-17 LAB — MAGNESIUM: MAGNESIUM: 1.7 mg/dL (ref 1.6–2.6)

## 2020-08-17 MED ADMIN — blinatumomab (BLINCYTO) 28 mcg/day in sodium chloride NON-PVC 0.9 % IVPB (7-DAY HOME INFUSION): 28 ug/d | INTRAVENOUS | @ 20:00:00 | Stop: 2020-08-24

## 2020-08-17 MED FILL — CRESEMBA 186 MG CAPSULE: ORAL | 28 days supply | Qty: 56 | Fill #1

## 2020-08-17 NOTE — Unmapped (Unsigned)
Labs drawn by peripheral stick. Blina infusing, waste withdrawn then flushed, de-accessed. Port re-accessed by [employer].

## 2020-08-18 NOTE — Unmapped (Signed)
Pt here for home chemo pump exchanged.  Tolerated pump exchange without difficulty.  Pt was stable at discharge via self ambulation.

## 2020-08-24 ENCOUNTER — Other Ambulatory Visit: Admit: 2020-08-24 | Discharge: 2020-08-25 | Payer: MEDICAID

## 2020-08-24 ENCOUNTER — Ambulatory Visit: Admit: 2020-08-24 | Discharge: 2020-08-25 | Payer: MEDICAID

## 2020-08-24 DIAGNOSIS — F419 Anxiety disorder, unspecified: Principal | ICD-10-CM

## 2020-08-24 DIAGNOSIS — C9100 Acute lymphoblastic leukemia not having achieved remission: Principal | ICD-10-CM

## 2020-08-24 DIAGNOSIS — G4700 Insomnia, unspecified: Principal | ICD-10-CM

## 2020-08-24 DIAGNOSIS — F331 Major depressive disorder, recurrent, moderate: Principal | ICD-10-CM

## 2020-08-24 LAB — CBC W/ AUTO DIFF
BASOPHILS ABSOLUTE COUNT: 0.1 10*9/L (ref 0.0–0.1)
BASOPHILS RELATIVE PERCENT: 2.4 %
EOSINOPHILS ABSOLUTE COUNT: 0.2 10*9/L (ref 0.0–0.5)
EOSINOPHILS RELATIVE PERCENT: 4.7 %
HEMATOCRIT: 33.5 % — ABNORMAL LOW (ref 39.0–48.0)
HEMOGLOBIN: 11.6 g/dL — ABNORMAL LOW (ref 12.9–16.5)
LYMPHOCYTES ABSOLUTE COUNT: 2.2 10*9/L (ref 1.1–3.6)
LYMPHOCYTES RELATIVE PERCENT: 54 %
MEAN CORPUSCULAR HEMOGLOBIN CONC: 34.6 g/dL (ref 32.0–36.0)
MEAN CORPUSCULAR HEMOGLOBIN: 32.4 pg (ref 25.9–32.4)
MEAN CORPUSCULAR VOLUME: 93.6 fL (ref 77.6–95.7)
MEAN PLATELET VOLUME: 7.8 fL (ref 6.8–10.7)
MONOCYTES ABSOLUTE COUNT: 0.7 10*9/L (ref 0.3–0.8)
MONOCYTES RELATIVE PERCENT: 17.1 %
NEUTROPHILS ABSOLUTE COUNT: 0.9 10*9/L — ABNORMAL LOW (ref 1.8–7.8)
NEUTROPHILS RELATIVE PERCENT: 21.8 %
PLATELET COUNT: 228 10*9/L (ref 150–450)
RED BLOOD CELL COUNT: 3.57 10*12/L — ABNORMAL LOW (ref 4.26–5.60)
RED CELL DISTRIBUTION WIDTH: 19.5 % — ABNORMAL HIGH (ref 12.2–15.2)
WBC ADJUSTED: 4 10*9/L (ref 3.6–11.2)

## 2020-08-24 LAB — COMPREHENSIVE METABOLIC PANEL
ALBUMIN: 3.9 g/dL (ref 3.4–5.0)
ALKALINE PHOSPHATASE: 69 U/L (ref 46–116)
ALT (SGPT): 11 U/L (ref 10–49)
ANION GAP: 4 mmol/L — ABNORMAL LOW (ref 5–14)
AST (SGOT): 12 U/L (ref <=34)
BILIRUBIN TOTAL: 0.2 mg/dL — ABNORMAL LOW (ref 0.3–1.2)
BLOOD UREA NITROGEN: 17 mg/dL (ref 9–23)
BUN / CREAT RATIO: 9
CALCIUM: 8.8 mg/dL (ref 8.7–10.4)
CHLORIDE: 111 mmol/L — ABNORMAL HIGH (ref 98–107)
CO2: 24 mmol/L (ref 20.0–31.0)
CREATININE: 1.89 mg/dL — ABNORMAL HIGH
EGFR CKD-EPI (2021) MALE: 45 mL/min/{1.73_m2} — ABNORMAL LOW (ref >=60)
GLUCOSE RANDOM: 98 mg/dL (ref 70–179)
POTASSIUM: 3.6 mmol/L (ref 3.4–4.8)
PROTEIN TOTAL: 6.4 g/dL (ref 5.7–8.2)
SODIUM: 139 mmol/L (ref 135–145)

## 2020-08-24 LAB — PHOSPHORUS: PHOSPHORUS: 3.4 mg/dL (ref 2.4–5.1)

## 2020-08-24 LAB — MAGNESIUM: MAGNESIUM: 1.7 mg/dL (ref 1.6–2.6)

## 2020-08-24 LAB — C-REACTIVE PROTEIN: C-REACTIVE PROTEIN: <4 mg/L (ref <=10.0)

## 2020-08-24 MED ORDER — NORTRIPTYLINE 50 MG CAPSULE
ORAL_CAPSULE | Freq: Every evening | ORAL | 11 refills | 30 days | Status: CP
Start: 2020-08-24 — End: 2021-08-24
  Filled 2020-08-31: qty 30, 30d supply, fill #0

## 2020-08-24 MED ADMIN — alteplase (ACTIVASE) injection small catheter clearance 2 mg: 2 mg | INTRAVENOUS | @ 17:00:00 | Stop: 2020-08-24

## 2020-08-24 MED ADMIN — blinatumomab (BLINCYTO) 28 mcg/day in sodium chloride NON-PVC 0.9 % IVPB (7-DAY HOME INFUSION): 28 ug/d | INTRAVENOUS | @ 20:00:00 | Stop: 2020-08-31

## 2020-08-24 MED ADMIN — dexamethasone (DECADRON) 4 mg/mL injection 16 mg: 16 mg | INTRAVENOUS | @ 20:00:00 | Stop: 2020-08-24

## 2020-08-24 NOTE — Unmapped (Signed)
Pt cam to lab.  His port needle had come out during the night with the infusion running.  Port re accessed by [employer].  10 ml waste removed.  Labs drawn and port flushed with saline.  Blood return sluggish so TPA instilled.      Kennon Holter pharm D was notified by Katheren Puller that pump was off since last night.

## 2020-08-25 NOTE — Unmapped (Signed)
Pt to clinic for neuro exam and Blinatomumab CADD pump replacement.  CADD pump attached, started, and running. Pt left ambulatory with spouse.

## 2020-08-25 NOTE — Unmapped (Signed)
Lab on 08/24/2020   Component Date Value Ref Range Status    Sodium 08/24/2020 139  135 - 145 mmol/L Final    Potassium 08/24/2020 3.6  3.4 - 4.8 mmol/L Final    Chloride 08/24/2020 111 (A) 98 - 107 mmol/L Final    CO2 08/24/2020 24.0  20.0 - 31.0 mmol/L Final    Anion Gap 08/24/2020 4 (A) 5 - 14 mmol/L Final    BUN 08/24/2020 17  9 - 23 mg/dL Final    Creatinine 16/01/9603 1.89 (A) 0.60 - 1.10 mg/dL Final    BUN/Creatinine Ratio 08/24/2020 9   Final    eGFR CKD-EPI (2021) Male 08/24/2020 45 (A) >=60 mL/min/1.48m2 Final    eGFR calculated with CKD-EPI 2021 equation in accordance with [employer] and [employer] of Nephrology Task Force recommendations.    Glucose 08/24/2020 98  70 - 179 mg/dL Final    Calcium 54/12/8117 8.8  8.7 - 10.4 mg/dL Final    Albumin 14/78/2956 3.9  3.4 - 5.0 g/dL Final    Total Protein 08/24/2020 6.4  5.7 - 8.2 g/dL Final    Total Bilirubin 08/24/2020 0.2 (A) 0.3 - 1.2 mg/dL Final    AST 21/30/8657 12  <=34 U/L Final    ALT 08/24/2020 11  10 - 49 U/L Final    Alkaline Phosphatase 08/24/2020 69  46 - 116 U/L Final    Magnesium 08/24/2020 1.7  1.6 - 2.6 mg/dL Final    Phosphorus 84/69/6295 3.4  2.4 - 5.1 mg/dL Final    CRP 28/41/3244 <4.0  <=10.0 mg/L Final    WBC 08/24/2020 4.0  3.6 - 11.2 10*9/L Final    RBC 08/24/2020 3.57 (A) 4.26 - 5.60 10*12/L Final    HGB 08/24/2020 11.6 (A) 12.9 - 16.5 g/dL Final    HCT 04/06/7251 33.5 (A) 39.0 - 48.0 % Final    MCV 08/24/2020 93.6  77.6 - 95.7 fL Final    MCH 08/24/2020 32.4  25.9 - 32.4 pg Final    MCHC 08/24/2020 34.6  32.0 - 36.0 g/dL Final    RDW 66/44/0347 19.5 (A) 12.2 - 15.2 % Final    MPV 08/24/2020 7.8  6.8 - 10.7 fL Final    Platelet 08/24/2020 228  150 - 450 10*9/L Final    Neutrophils % 08/24/2020 21.8  % Final    Lymphocytes % 08/24/2020 54.0  % Final    Monocytes % 08/24/2020 17.1  % Final    Eosinophils % 08/24/2020 4.7  % Final    Basophils % 08/24/2020 2.4  % Final    Absolute Neutrophils 08/24/2020 0.9 (A) 1.8 - 7.8 10*9/L Final    Absolute Lymphocytes 08/24/2020 2.2  1.1 - 3.6 10*9/L Final    Absolute Monocytes 08/24/2020 0.7  0.3 - 0.8 10*9/L Final    Absolute Eosinophils 08/24/2020 0.2  0.0 - 0.5 10*9/L Final    Absolute Basophils 08/24/2020 0.1  0.0 - 0.1 10*9/L Final    Anisocytosis 08/24/2020 Moderate (A) Not Present Final

## 2020-08-28 NOTE — Unmapped (Signed)
[facility] Refill Coordination Note    Specialty Medication(s) to be Shipped:   Hematology/Oncology: Iclusig    Other medication(s) to be shipped: No additional medications requested for fill at this time     Adam Keith, DOB: 1981-01-18  Phone: [phone] (home)       All above HIPAA information was verified with patient.     Was a Nurse, learning disability used for this call? No    Completed refill call assessment today to schedule patient's medication shipment from the [facility] [phone]).  All relevant notes have been reviewed.     Specialty medication(s) and dose(s) confirmed: Regimen is correct and unchanged.   Changes to medications: [name] reports no changes at this time.  Changes to insurance: No  New side effects reported not previously addressed with a pharmacist or physician: None reported  Questions for the pharmacist: No    Confirmed patient received a Conservation officer, historic buildings and a Surveyor, mining with first shipment. The patient will receive a drug information handout for each medication shipped and additional FDA Medication Guides as required.       DISEASE/MEDICATION-SPECIFIC INFORMATION        N/A    SPECIALTY MEDICATION ADHERENCE     Medication Adherence    Patient reported X missed doses in the last month: 0  Specialty Medication: Iclusig 30 mg  Patient is on additional specialty medications: No  Informant: patient              Were doses missed due to medication being on hold? No    Iclusig 30 mg: 14 days of medicine on hand       REFERRAL TO PHARMACIST     Referral to the pharmacist: Not needed      [facility]     Shipping address confirmed in Epic.     Delivery Scheduled: Yes, Expected medication delivery date: 09/09/20.     Medication will be delivered via UPS to the prescription address in Epic [place].    Adam Keith   [facility] Specialty Technician

## 2020-08-31 ENCOUNTER — Ambulatory Visit: Admit: 2020-08-31 | Discharge: 2020-09-01 | Payer: MEDICAID

## 2020-08-31 LAB — CBC W/ AUTO DIFF
BASOPHILS ABSOLUTE COUNT: 0.2 10*9/L — ABNORMAL HIGH (ref 0.0–0.1)
BASOPHILS RELATIVE PERCENT: 1.4 %
EOSINOPHILS ABSOLUTE COUNT: 0.2 10*9/L (ref 0.0–0.5)
EOSINOPHILS RELATIVE PERCENT: 1.2 %
HEMATOCRIT: 36.3 % — ABNORMAL LOW (ref 39.0–48.0)
HEMOGLOBIN: 12.7 g/dL — ABNORMAL LOW (ref 12.9–16.5)
LYMPHOCYTES ABSOLUTE COUNT: 2.4 10*9/L (ref 1.1–3.6)
LYMPHOCYTES RELATIVE PERCENT: 18.5 %
MEAN CORPUSCULAR HEMOGLOBIN CONC: 35 g/dL (ref 32.0–36.0)
MEAN CORPUSCULAR HEMOGLOBIN: 33.2 pg — ABNORMAL HIGH (ref 25.9–32.4)
MEAN CORPUSCULAR VOLUME: 94.8 fL (ref 77.6–95.7)
MEAN PLATELET VOLUME: 7.3 fL (ref 6.8–10.7)
MONOCYTES ABSOLUTE COUNT: 1.3 10*9/L — ABNORMAL HIGH (ref 0.3–0.8)
MONOCYTES RELATIVE PERCENT: 10.1 %
NEUTROPHILS ABSOLUTE COUNT: 8.9 10*9/L — ABNORMAL HIGH (ref 1.8–7.8)
NEUTROPHILS RELATIVE PERCENT: 68.8 %
PLATELET COUNT: 304 10*9/L (ref 150–450)
RED BLOOD CELL COUNT: 3.82 10*12/L — ABNORMAL LOW (ref 4.26–5.60)
RED CELL DISTRIBUTION WIDTH: 19.3 % — ABNORMAL HIGH (ref 12.2–15.2)
WBC ADJUSTED: 12.9 10*9/L — ABNORMAL HIGH (ref 3.6–11.2)

## 2020-08-31 LAB — COMPREHENSIVE METABOLIC PANEL
ALBUMIN: 4 g/dL (ref 3.4–5.0)
ALKALINE PHOSPHATASE: 79 U/L (ref 46–116)
ALT (SGPT): 15 U/L (ref 10–49)
ANION GAP: 7 mmol/L (ref 5–14)
AST (SGOT): 19 U/L (ref <=34)
BILIRUBIN TOTAL: 0.2 mg/dL — ABNORMAL LOW (ref 0.3–1.2)
BLOOD UREA NITROGEN: 19 mg/dL (ref 9–23)
BUN / CREAT RATIO: 10
CALCIUM: 9.2 mg/dL (ref 8.7–10.4)
CHLORIDE: 108 mmol/L — ABNORMAL HIGH (ref 98–107)
CO2: 24 mmol/L (ref 20.0–31.0)
CREATININE: 1.95 mg/dL — ABNORMAL HIGH
EGFR CKD-EPI (2021) MALE: 44 mL/min/{1.73_m2} — ABNORMAL LOW (ref >=60)
GLUCOSE RANDOM: 92 mg/dL (ref 70–179)
POTASSIUM: 4.3 mmol/L (ref 3.4–4.8)
PROTEIN TOTAL: 6.5 g/dL (ref 5.7–8.2)
SODIUM: 139 mmol/L (ref 135–145)

## 2020-08-31 LAB — SLIDE REVIEW

## 2020-08-31 LAB — MAGNESIUM: MAGNESIUM: 1.8 mg/dL (ref 1.6–2.6)

## 2020-08-31 LAB — PHOSPHORUS: PHOSPHORUS: 3.3 mg/dL (ref 2.4–5.1)

## 2020-08-31 MED ADMIN — blinatumomab (BLINCYTO) 28 mcg/day in sodium chloride NON-PVC 0.9 % IVPB (7-DAY HOME INFUSION): 28 ug/d | INTRAVENOUS | @ 17:00:00 | Stop: 2020-09-07

## 2020-08-31 MED FILL — SULFAMETHOXAZOLE 800 MG-TRIMETHOPRIM 160 MG TABLET: ORAL | 28 days supply | Qty: 16 | Fill #3

## 2020-08-31 MED FILL — ASPIRIN 81 MG CHEWABLE TABLET: ORAL | 36 days supply | Qty: 36 | Fill #1

## 2020-08-31 MED FILL — VALACYCLOVIR 500 MG TABLET: ORAL | 30 days supply | Qty: 30 | Fill #3

## 2020-08-31 NOTE — Unmapped (Signed)
Encounter addended by: Satira Mccallum, RN on: 08/31/2020 3:31 PM   Actions taken: Flowsheet accepted

## 2020-08-31 NOTE — Unmapped (Signed)
Pt arrived to infusion in NAD. Labs collected and sent for analysis. Port needle changed, +BR prior to connecting to new bag of blinatumomab. Pt connected to home pump. 2 RN's checked all settings, clamps, and connections. Pump read running, green light flashing.

## 2020-08-31 NOTE — Unmapped (Signed)
Labs found to be within parameters for treatment today. Request for drug sent to pharmacy.

## 2020-09-01 ENCOUNTER — Telehealth: Admit: 2020-09-01 | Discharge: 2020-09-02 | Payer: MEDICAID

## 2020-09-01 DIAGNOSIS — F3341 Major depressive disorder, recurrent, in partial remission: Principal | ICD-10-CM

## 2020-09-01 DIAGNOSIS — B49 Unspecified mycosis: Principal | ICD-10-CM

## 2020-09-01 DIAGNOSIS — C9100 Acute lymphoblastic leukemia not having achieved remission: Principal | ICD-10-CM

## 2020-09-01 DIAGNOSIS — F419 Anxiety disorder, unspecified: Principal | ICD-10-CM

## 2020-09-01 MED ORDER — CLONAZEPAM 0.5 MG TABLET
ORAL_TABLET | Freq: Two times a day (BID) | ORAL | 0 refills | 30 days | Status: CP | PRN
Start: 2020-09-01 — End: ?

## 2020-09-01 NOTE — Unmapped (Signed)
[facility]  Psychiatry   Established Patient E&M Service - Outpatient       Assessment:    Adam Keith presents for follow-up evaluation. He states that his nortriptyline 50 mg is providing help with his sleep, anxiety, and depression and he feels that the 50 mg dose is a good dose for him. He continues to use clonazepam 0.5 mg PRN anxiety or sleep. No medication changes needed today. Will reassess on 10/27/20.    Identifying Information:  Adam [name] with a history of PH+ B cell ALL, major depressive disorder, PTSD, anxiety, and insomnia.??He has a long struggle with intermittent depressive episodes with one suicide attempt at age 72.??He was recently hospitalized for chemotherapy and was started on nortriptyline and clonazepam to manage his symptoms. Recuperating from DRESS and currently on a prednisone taper.??    Risk Assessment:  An assessment of suicide and violence risk factors was performed as part of this evaluation and is not significantly changed from the last visit.   While future psychiatric events cannot be accurately predicted, the patient does not currently require acute inpatient psychiatric care and does not currently meet [facility] involuntary commitment criteria.      Plan:    Problem 1:??Major Depressive Disorder  Status of problem:??chronic with mild exacerbation  Interventions:??  ?? Continue nortriptyline 50 mg at bedtime    ?? Continue psychotherapy with local therapist. Patient states that he has been doing well and has not been meeting with his therapist recently.  ??  Problem 2:??Anxiety  Status of problem:??chronic and stable  Interventions:??  ?? See nortriptyline plan above  ?? OK to use hydroxyzine 25 mg for breakthrough anxiety  ?? OK to continue clonazepam??0.5??mg tablets.   ??  Problem 3:??Insomnia  Status of problem:????improved  Interventions:??  ?? See nortriptyline and clonazepam plans above  ?? Will plan to educate re: sleep hygiene      Psychotherapy provided:  No billable psychotherapy service provided.    Patient has been given this writer's contact information as well as the [facility] urgent line number. The patient has been instructed to call 911 for emergencies.      Subjective:    Chief complaint:  Follow-up psychiatric evaluation for anxiety, depression, and insomnia    Interval History: Depression and anxiety improving. Patient tolerating the nortriptyline 50 mg dose well and states that he thinks that the recent dose increase did improve his mood and anxiety. Patient using the clonazepam to help with sleep at night. Patient now sleeping well. States that he has not needed to meet with his local therapist lately.     Prior Psychiatric Medication Trials: bupropion (could not tolerate), duloxetine, mirtazapine (given during recent hospitalization), lorazepam    Psychiatric Review of Systems  ??? Depressive Symptoms: depressed mood at times, fatigue, and difficulty concentrating  ??? Manic Symptoms: N/A  ??? Psychosis Symptoms: N/A  ??? Anxiety Symptoms: easily fatigued, difficulty concentrating, excessive anxiety and worry at times   ??? Panic Symptoms: denies  ??? PTSD Symptoms: no recent struggles with PTSD symptoms. Previously reported hypervigiliance, avoidance, survivor's guilt, and intermittent traumatic memories  ??? Sleep disturbance: denies   ??? Exercise: Unable to engage in a daily exercise routine.        Objective:      Mental Status Exam:  Appearance:    Appears stated age, Well nourished and Clean/Neat   Motor:   No abnormal movements   Speech/Language:  Normal rate, volume, tone, fluency   Mood:   I'm doing better   Affect:   Calm, Cooperative and Euthymic   Thought process and Associations:   Logical, linear, clear, coherent, goal directed   Abnormal/psychotic thought content:     Denies SI, HI, self harm, delusions, obsessions, paranoid ideation, or ideas of reference   Perceptual disturbances:     Denies auditory and visual hallucinations, behavior not concerning for response to internal stimuli     Other:   insight and judgment intact     I personally spent 26 minutes face-to-face and non-face-to-face in the care of this patient, which includes all pre, intra, and post visit time on the date of service.    Visit was completed by video (or phone) and the appropriate disclaimer has been included below.    The patient reports they are currently: at home. I spent 12 minutes on the real-time audio and video with the patient on the date of service. I spent an additional 14 minutes on pre- and post-visit activities on the date of service.     The patient was physically located in [place] or a state in which I am permitted to provide care. The patient and/or parent/guardian understood that s/he may incur co-pays and cost sharing, and agreed to the telemedicine visit. The visit was reasonable and appropriate under the circumstances given the patient's presentation at the time.    The patient and/or parent/guardian has been advised of the potential risks and limitations of this mode of treatment (including, but not limited to, the absence of in-person examination) and has agreed to be treated using telemedicine. The patient's/patient's family's questions regarding telemedicine have been answered.     If the visit was completed in an ambulatory setting, the patient and/or parent/guardian has also been advised to contact their provider???s office for worsening conditions, and seek emergency medical treatment and/or call 911 if the patient deems either necessary.      Sheran Lawless, PMHNP  09/01/2020

## 2020-09-01 NOTE — Unmapped (Signed)
It was very nice meeting with you today. No medication changes needed today. Continue taking your nortriptyline 50 mg at bedtime. Clonazepam prescription provided for you to take as needed for anxiety or insomnia.    Please let me know if you have any questions or concerns. We will plan to meet again on 7/26.    Best,  [name]      Com[facility] Cancer Support Program ([facility]SP) - Psychiatry Outpatient Clinic   After Visit Summary    It was a pleasure to see you today in the [facility]???s Comprehensive Cancer Support Program (CCSP). The CCSP is a multidisciplinary program dedicated to helping patients, caregivers, and families with cancer treatment, recovery and survivorship.      To schedule, cancel, or change your appointment:  Please call the [facility] schedulers at [phone], Monday through Friday 8AM - 5PM.  Someone will return your call within 24 hours.      If you have a question about your medicines or you need to contact your provider:  Please call the CCSP program coordinator, Myrene Galas, at [phone].     For after hours urgent issues, you may call [phone] or call the I need to talk line at [phone]-TALK ([phone]) anytime 24/7.    CCSP Patient and Family Resource Center: [phone].    CCSP Website:  [email or web address]    For prescription refills, please allow at least 24 hours (during business hours, M-F) for providers to call in refills to your pharmacy. We are generally unable to accommodate same-day requests for refills.     If you are taking any controlled substances (such as anxiety or sleep medications), you must use them as the directions say to use them. We generally do not provide early refills over the phone without clear reason, and it would be inappropriate to obtain the medications from other doctors. We routinely use the [place] controlled substance database to monitor prescription drug use.

## 2020-09-02 ENCOUNTER — Telehealth
Admit: 2020-09-02 | Discharge: 2020-09-03 | Payer: MEDICAID | Attending: Student in an Organized Health Care Education/Training Program | Primary: Student in an Organized Health Care Education/Training Program

## 2020-09-02 DIAGNOSIS — G893 Neoplasm related pain (acute) (chronic): Principal | ICD-10-CM

## 2020-09-02 DIAGNOSIS — Z515 Encounter for palliative care: Principal | ICD-10-CM

## 2020-09-02 MED ORDER — OXYCONTIN 15 MG TABLET,CRUSH RESISTANT,EXTENDED RELEASE
ORAL_TABLET | Freq: Two times a day (BID) | ORAL | 0 refills | 28.00000 days | Status: CP
Start: 2020-09-02 — End: 2020-09-30

## 2020-09-02 MED ORDER — HYDROMORPHONE 2 MG TABLET
ORAL_TABLET | Freq: Four times a day (QID) | ORAL | 0 refills | 8.00000 days | Status: CP | PRN
Start: 2020-09-02 — End: 2020-09-30

## 2020-09-02 NOTE — Unmapped (Signed)
[facility]    Patient Name: Adam Keith  Patient Age: 40 y.o.  Encounter Date: 09/09/2020    Primary Care Provider:  [name], MD    Referring Physician:  Lenon Ahmadi, AGNP  [address]  [id number]  [place],  [place] [address]    Reason for visit:  Ph+ B-ALL follow up to consider timing of therapy change    Assessment:  A 40 y.o. 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 The course was complicated by septic shock, candida krusei fungemia, MRSA bacteremia with septic emboli c/b acute renal failure and respiratory distress requiring dialysis and intubation.  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  He subsequently had difficulty tolerating single agent dasatinib (as a bridge to planned ponatinib-blinatumomab--had fluid retention and pleural effusion).      He is now s/p cycle 2 of ponatinib-blinatumomab. BCR-ABL after Cycle 1 was 0.006% in the marrow.  Cycle 2 was uncomplicated. Although he reports he feels poorly each cycle, he feels this cycle went relatively well.Marland Kitchen BCR-ABL from peripheral blood is pending.  He has an IT therapy planned for next Friday.    We discussed that he can now proceed to starting each cycle of blinatumomab outpatient.  The plan remains to complete 5 cycles.  We will check BCR-ABL level each cycle.  If the BCR-ABL level becomes undetectable we will dose reduce the ponatinib to 15 mg.    IVIG will be continued - once every 6 weeks.  Continues to be followed by [facility]. Isavuconazole trough level collected today and they have planned a repeat Chest CT in July.  Rash has not recurred, but he does continue to itch. 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 Caryn Section, dermatolgy  - rash currently resolved but itching persists  - continue prednisone     Dyspnea, PND, edema, pleural effusion:  Likely due to dasatinib.  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 Now s/p dialysis  - stable Cr appears to have developed CKD due to incompletely healed acute injury.  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 [name]  **hx Pneumonia - MRSA likely, fungal tests negative  - Chest CT in July, per Dr. [name]     Symptom management: bowel regimen as listed in medication list, analgesics as listed in medication list or symptoms reviewed and due to acceptable control, no changes made  **Peripheral  neuropathy, sensory and motor  - Grade 1-2 monitor  **Chronic Back Pain, complicated by hospital stay and procedures  - Followed by Dr. Genice Rouge, Palliative Care  **Depression/anxiety: met with [name], [facility]  - nortriptyline  - PRN clonazepam  - has not been using PRN hydroxyzine      Venous access: port-a-cath in place     Supportive Care Recommendations:  We recommend based on the patient???s underlying diagnosis and treatment history the following supportive care:      1. Antimicrobial prophylaxis:    - Viral - valacyclovir 500mg  daily  - Bacterial - on treatment linezolid currently  - Fungal - on treatment isavuconazole  - PJP - Bactrim DS BID Sat/Sun  -Evusheld 04/21/2020-recommend another dose to achieve FDA recommendation update.    2. Blood product support:  I recommend the following intervals for laboratory monitoring to determine transfusion needs and monitor for hematologic effects of therapy and underlying disease: no routine monitoring outside of clinic follow-up    Leukoreduced blood products are required.  Irradiated blood products are preferred, but in case of urgent transfusion needs non-irradiated blood products may be used:     -  RBC transfusion threshold: transfuse 2 units for Hgb < 8 g/dL.  -  Platelet transfusion threshold: transfuse 1 unit of platelets for platelet count < 10, or for bleeding or need for invasive procedure.    3. Hematopoietic growth factor support: none      Care coordination: The next [facility] Follow up requested:  - see notes above    These services include the following elements of medical decision making:  addressing a hematologic cancer that poses a threat to life and/or bone marrow function  interpretation of diagnostic test reports or ordering of diagnostic tests and independent interpretation of diagnostic tests  High risk of morbidity due to drug therapy requiring monitoring for toxicity or decisions regarding goals and continuation of antineoplastic therapy    [name], AGPCNP-BC  Nurse Practitioner  Hematology/Oncology  [facility]      Troy Sine. [name], MD  Leukemia Program  Division of Hematology/Oncology  [facility]    Nurse Navigator (non-clinical trial patients): Colletta Maryland, RN        Tel. [phone]       Fax. [phone]  Toll-free appointments: [phone]  Scheduling assistance: [phone]  After hours/weekends: [phone] (ask for adult hematology/oncology on-call)        History of Present Illness:  We had the pleasure of seeing Koji Niehoff in the Leukemia Clinic at the [facility] [place] on 09/09/2020.  He is a 40 y.o. male with Ph+ B-ALL.          His oncologic history is as follows:      Oncology History Overview Note   Referring/Local Oncologist: None    Diagnosis:Ph+ ALL    Genetics:    Karyotype/FISH:Abnormal Karyotype: 46,XY,t(9;22)(q34;q11.2)[1]/45,XY,der(7;9)(q10;q10)t(9;22)(q34;q11.2),der(22)t(9;22)[11]/46,sdl,+der(22)t(9;22)[5]/46,XY[3]     Abnormal FISH: A BCR/ABL1 interphase FISH assay shows an abnormal signal pattern in 97% of the 100 cells scored. 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  The findings support a diagnosis of leukemia and have implications for targeted therapy and for monitoring residual disease.      Molecular Genetics:  BCR-ABL1 p210 transcripts were detected at a level of 46.479 IS% ratio in bone marrow.  BCR-ABL1 p190 transcripts were detected  at a level of 4 in 100,000 cells in bone marrow.    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)     01/24/2020 - 04/02/2020 Chemotherapy    IP/OP LEUKEMIA GRAAPH-2005 + RITUXIMAB < 60 YO  rituximab hypercvad (odd and even course)     02/24/2020 Remission    CR with PCR MRD+; marrow showing 70% blasts with <1% blasts, negative flow MRD, BCR-ABL p210 PCR 0.197%; CNS negative for blasts     03/09/2020 Progression    CSF - rare blast ID'ed - IT chemo given    03/13/20 - IT chemo, CSF negative     04/16/2020 Biopsy    BM Bx with 40-50% cellularity, <1% blasts, MRD flow cytometry negative, BCR-ABL p210 transcripts 0.036%     05/28/2020 - 05/28/2020 Chemotherapy    OP AML - CNS THERAPY (INTRATHECAL CYTARABINE, INTRATHECAL METHOTREXATE, OR INTRATHECAL TRIPLE)  Select one of the following: cytarabine IT 100 mg with hydrocortisone 50 mg, cytarabine IT 40 mg with methotrexate 15 mg with hydrocortisone 50 mg, OR methotrexate IT 12 mg with hydrocortisone 50 mg     06/19/2020 -  Chemotherapy    IP/OP LEUKEMIA BLINATUMOMAB 7-DAY INFUSION (MINIMAL RESIDUAL DISEASE; WT >= 22 KG) (HOME INFUSION)      Cycles 1*-4: Blinatumomab 28 mcg/day Days 1-28 of 6-week cycle.  *Given in the inpatient setting on Days 1-3 on Cycle 1 and Days 1-2 on Cycle 2, while other treatment days are given in the outpatient setting.    Cycle 1 MRD Dosing     07/18/2020 Adverse Reaction    Hospitalization: fevers. Pneumonia     07/23/2020 Biopsy    Diagnosis  Bone marrow, right iliac, aspiration and biopsy  -   Normocellular bone marrow (50%) with trilineage hematopoiesis and 1% blasts by manual aspirate differential  -   Flow cytometry MRD analysis reveals no definitive immunophenotypic evidence of residual B lymphoblastic leukemia   - BCR-ABL p210 0.006%         08/10/2020 -  Chemotherapy    Cycle 2 blinatumomab-ponatinib  Ponatinib 30mg     1 IT per cycle         Interim History:  Since his last visit he completed a second cycle of blinatumomab. He reports that he felt bad during the cycle, but it was better than before, with no major complications. His rash did not return but his skin still gets itchy. Dermatology has been adjusting the prednisone dose and it feels controlled currently.  Last week he had a day of nausea/vomiting and not feeling well, but that resolved quickly. He is feeling fairly good today and better overall than he felt on blinatumomab.      Past Medical, Surgical and Family History were reviewed and pertinent updates were made in the Electronic Medical Record      ECOG Performance Status: 1    Medications:    Current Outpatient Medications   Medication Sig Dispense Refill   ??? acetaminophen (TYLENOL) 325 MG tablet Take 650 mg by mouth every six (6) hours as needed for pain.      ??? amLODIPine (NORVASC) 10 MG tablet Take 1 tablet (10 mg total) by mouth daily. 30 tablet 0   ??? aspirin 81 MG chewable tablet Chew 1 tablet (81 mg  total) daily. 36 tablet 11   ??? carvediloL (COREG) 12.5 MG tablet TAKE 1 TABLET BY MOUTH TWO TIMES A DAY. 180 tablet 1   ??? clonazePAM (KLONOPIN) 0.5 MG tablet Take 1 tablet (0.5 mg total) by mouth two (2) times a day as needed for anxiety or sleep. 60 tablet 0   ??? gabapentin (NEURONTIN) 300 MG capsule Take 300 mg by mouth Three (3) times a day.     ??? HYDROmorphone (DILAUDID) 2 MG tablet Take 1 tablet (2 mg total) by mouth every six (6) hours as needed for pain,severe (7-10) for up to 28 days. 30 tablet 0   ??? HYDROmorphone (DILAUDID) 2 MG tablet Take 1 tablet (2 mg total) by mouth every six (6) hours as needed for pain,severe (7-10) for up to 28 days. 30 tablet 0   ??? hydrOXYzine (ATARAX) 25 MG tablet Take 1 tablet (25 mg total) by mouth every six (6) hours as needed. 60 tablet 1   ??? isavuconazonium sulfate (CRESEMBA) 186 mg cap capsule Take 2 capsules (372 mg total) by mouth daily. 56 capsule 2   ??? nortriptyline (PAMELOR) 50 MG capsule Take 1 capsule (50 mg total) by mouth nightly. 30 capsule 11   ??? ondansetron (ZOFRAN) 8 MG tablet Take 1 tablet (8 mg total) by mouth every eight (8) hours as needed for nausea (or itching). 60 tablet 2   ??? oxyCODONE (OXYCONTIN) 15 mg 12 hr crush resistant ER/CR tablet Take 1 tablet (15 mg total) by mouth two (2) times a day for 28 days. 56 tablet 0   ??? oxyCODONE (OXYCONTIN) 15 mg 12 hr crush resistant ER/CR tablet Take 1 tablet (15 mg total) by mouth two (2) times a day for 28 days. 56 tablet 0   ??? PONATinib (ICLUSIG) 30 mg tablet Take 1 tablet (30 mg total) by mouth daily. Swallow tablets whole. Do not crush, break, cut or chew tablets. 30 tablet 5   ??? prochlorperazine (COMPAZINE) 10 MG tablet Take 1 tablet (10 mg total) by mouth every six (6) hours as needed for nausea (if no relief from zofran (ondansetron)). 60 tablet 1   ??? sulfamethoxazole-trimethoprim (BACTRIM DS) 800-160 mg per tablet Take 1 tablet (160 mg of trimethoprim total) by mouth 2 times a day on Saturday, Sunday. For prophylaxis while on chemo. 48 tablet 3   ??? valACYclovir (VALTREX) 500 MG tablet Take 1 tablet (500 mg total) by mouth daily. 90 tablet 3   ??? gabapentin (NEURONTIN) 300 MG capsule Take 1 capsule (300 mg total) by mouth Three (3) times a day. 90 capsule 0     No current facility-administered medications for this visit.       Vital Signs:  Vitals:    09/09/20 1132   BP: 135/88   Pulse: 81   Resp: 16   Temp: 36.6 ??C (97.9 ??F)   SpO2: 98%         Relevant Physical Exam Findings:  Well appearing, accompanied by his wife  No rash present.   Wheezes bilaterally but non-labored breathing.    Relevant Laboratory, radiology and pathology results:  I personally viewed the most recent internal records, laboratory results, hematopathology report and molecular pathology reports  and discussed the available results with the patient and family  A summary of results follows:  Lab on 09/09/2020   Component Date Value Ref Range Status   ??? Sodium 09/09/2020 138  135 - 145 mmol/L Final   ??? Potassium 09/09/2020 4.2  3.4 - 4.8  mmol/L Final   ??? Chloride 09/09/2020 114 (A) 98 - 107 mmol/L Final   ??? CO2 09/09/2020 22.0  20.0 - 31.0 mmol/L Final   ??? Anion Gap 09/09/2020 2 (A) 5 - 14 mmol/L Final   ??? BUN 09/09/2020 18  9 - 23 mg/dL Final   ??? Creatinine 09/09/2020 1.83 (A) 0.60 - 1.10 mg/dL Final   ??? BUN/Creatinine Ratio 09/09/2020 10   Final   ??? eGFR CKD-EPI (2021) Male 09/09/2020 47 (A) >=60 mL/min/1.8m2 Final    eGFR calculated with CKD-EPI 2021 equation in accordance with [employer] and [employer] of Nephrology Task Force recommendations.   ??? Glucose 09/09/2020 91  70 - 179 mg/dL Final   ??? Calcium 81/19/1478 9.4  8.7 - 10.4 mg/dL Final   ??? Albumin 29/56/2130 3.9  3.4 - 5.0 g/dL Final   ??? Total Protein 09/09/2020 6.5  5.7 - 8.2 g/dL Final   ??? Total Bilirubin 09/09/2020 0.2 (A) 0.3 - 1.2 mg/dL Final   ??? AST 86/57/8469 17  <=34 U/L Final   ??? ALT 09/09/2020 26  10 - 49 U/L Final   ??? Alkaline Phosphatase 09/09/2020 81  46 - 116 U/L Final   ??? Collection 09/09/2020 Collected   Final   ??? WBC 09/09/2020 18.2 (A) 3.6 - 11.2 10*9/L Final   ??? RBC 09/09/2020 3.85 (A) 4.26 - 5.60 10*12/L Final   ??? HGB 09/09/2020 12.7 (A) 12.9 - 16.5 g/dL Final   ??? HCT 62/95/2841 36.9 (A) 39.0 - 48.0 % Final   ??? MCV 09/09/2020 95.9 (A) 77.6 - 95.7 fL Final   ??? MCH 09/09/2020 33.0 (A) 25.9 - 32.4 pg Final   ??? MCHC 09/09/2020 34.5  32.0 - 36.0 g/dL Final   ??? RDW 32/44/0102 17.7 (A) 12.2 - 15.2 % Final   ??? MPV 09/09/2020 7.8  6.8 - 10.7 fL Final   ??? Platelet 09/09/2020 226  150 - 450 10*9/L Final   ??? Neutrophils % 09/09/2020 70.6  % Final   ??? Lymphocytes % 09/09/2020 19.0  % Final   ??? Monocytes % 09/09/2020 8.5  % Final   ??? Eosinophils % 09/09/2020 1.0  % Final   ??? Basophils % 09/09/2020 0.9  % Final   ??? Absolute Neutrophils 09/09/2020 12.9 (A) 1.8 - 7.8 10*9/L Final   ??? Absolute Lymphocytes 09/09/2020 3.5  1.1 - 3.6 10*9/L Final   ??? Absolute Monocytes 09/09/2020 1.5 (A) 0.3 - 0.8 10*9/L Final   ??? Absolute Eosinophils 09/09/2020 0.2  0.0 - 0.5 10*9/L Final   ??? Absolute Basophils 09/09/2020 0.2 (A) 0.0 - 0.1 10*9/L Final   ??? Anisocytosis 09/09/2020 Slight (A) Not Present Final   ??? Case Report 09/09/2020    Final                    Value:Molecular Genetics Report                         Case: [id number]                                 Authorizing Provider:  Lenon Ahmadi,     Collected:           09/09/2020 1049  AGNP                                                                         Ordering Location:     [facility] AD[facility]    Received:            09/09/2020 1057                                     [address] [place]                                                     Pathologist:           Lesly Dukes, MD                                                              Specimen:    Blood                                                                                     ??? Specimen Type 09/09/2020    Final                    Value:Blood   ??? BCR/ABL1 p210 Assay 09/09/2020 Positive   Final   ??? BCR/ABL1 p210 IS% Ratio 09/09/2020 0.005   Final   ??? BCR/ABL1 p210 Assay Results 09/09/2020    Final                    Value:This result contains rich text formatting which cannot be displayed here.   ??? Smear Review Comments 09/09/2020 See Comment (A) Undefined Final    Slide reviewed.   Myelocytes present.     ??? Neutrophil Left Shift 09/09/2020 Present (A) Not Present Final     Chest X-ray PA and lateral ??Increased interstitial edema versus atypical infection/inflammation.  ??  Small right pleural effusion.    Diagnosis   Date Value Ref Range Status   08/07/2020   Final    A:  Cerebrospinal fluid, cytospin review and flow cytometry   -  No blasts identified    This electronic signature is attestation that the pathologist personally reviewed the submitted material(s) and the final diagnosis reflects that evaluation.       Diagnosis   Date Value Ref Range Status   08/07/2020   Final    A:  Cerebrospinal fluid, cytospin review and flow cytometry   -  No blasts identified    This electronic signature is attestation that the pathologist personally reviewed the submitted material(s) and the final diagnosis reflects that evaluation.     07/23/2020   Final    Bone marrow, right iliac, aspiration and biopsy  -   Normocellular bone marrow (50%) with trilineage hematopoiesis and 1% blasts by manual aspirate differential  -   Flow cytometry MRD analysis reveals no definitive immunophenotypic evidence of residual B lymphoblastic leukemia   -  See linked reports for associated Ancillary Studies.      This electronic signature is attestation that the pathologist personally reviewed the submitted material(s) and the final diagnosis reflects that evaluation.     07/20/2020   Final    Lung, right upper lobe, bronchoalveolar lavage:  - No malignant cells identified  - Acute inflammation and alveolar macrophages present  - Correlate with microbiology studies    This electronic signature is attestation that the pathologist personally reviewed the submitted material(s) and the final diagnosis reflects that evaluation.

## 2020-09-02 NOTE — Unmapped (Signed)
OUTPATIENT ONCOLOGY PALLIATIVE CARE    Principal Diagnosis: Mr. Furio is a 40 y.o. 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     Assessment/Plan:   1.  Pain: Combination of treatment related pain plus underlying history of chronic pain.  There was miscommunication about how to take his opioids at a previouslye.  However, this has been fixed and pain is significantly improved.  -Continue nighttime OxyContin 15 mg PO q 12h  -Hydromorphone 2 mg PO q4h PRN; only taking once daily currently  -Continue gabapentin 300 mg PO TID    2.  Opioid-induced constipation: Improved.  Encouraged him to message [place] if not consistently having regular bowel movements.  -Senna 1 tablet every night    3. Anxiety: Seen by the psychiatry consult liaison service while in the hospital.  Following with [name].  Significantly improved recently  -Nortriptyline 50 mg nightly  -Continue Klonopin 0.5 mg BID as needed  -Given his description of intermittent feelings of physical restlessness associated with his anxiety, could consider propanolol 10 to 20 mg every 8 hours as needed    4.  Advance care planning: Did not address at this visit. General states that he can't wait to be done with all of the cancer treatments. However, currenlty tolerating treatment ok.       # Controlled substances risk management.   - Patient does not have a signed pain medication agreement with our team.   - [place] database was reviewed today and it was appropriate.   - Urine drug screen was not performed at this visit. Findings: not applicable.   - Patient has received information about safe storage and administration of medications.   - Patient has not received a prescription for narcan; is not applicable.       F/u: 4 weeks.  Clinical pharmacist to call 5/2 to check in.    ----------------------------------------  Referring Provider: Dr. Berline Lopes  Oncology Team: Malignant hematology team  PCP: [name], MD      HPI: 40 year old man with a diagnosis of B-ALL, diagnosed in October 2021.  Recently had an 8-day hospitalization, discharged on 3/26.    Current cancer-directed therapy: Blinatumomab (for MRD)    Interval History:   Back on his cancer treatment and feels like he feels like crap. Feels like he has the flu, feels achey all over and fatigued. Worse at night. Has been able to sleep through the night. Hoping to finish after this bag, hopeful he will done on Monday.     Feels like his pain medication is working well. Taking Oxycontin 15mg  q12h and then Dilaudid 2mg  once a day around 5 or 6. Feels like this has controlled his pain. Also feels like his mood has gotten better. Continues to stay positive. Staying busy and using distraction a lot. Has bene doing more wood working which has been helpful. Is working on coaster, clocks and lots of other projects.     Also enjoys golf, but unfortunately his balance has been keeping. [name] doing well, working today. Still having regular BMs. Takes senna 1 tab daily, having daily BMs. Previously had IBS and was going 2-3 times a day. Makes a point to go every day.     Nausea has been well controlled.     Symptom Review:  General: Doing very well  Pain: See above  Fatigue: Stable  Mobility: Limited by pain  Sleep: Improved  Appetite: Stable  Nausea: As above  Bowel function: No issues  reported  Dyspnea: None  Secretions: None  Mood: Anxiety continues    Palliative Performance Scale: 80% - Ambulation: Full / Normal Activity with effort, some evidence of disease / Self-Care:Full / Intake: Normal or reduced / Level of Conscious: Full    Coping/Support Issues: Some difficulty coping with his current illness.  However, well supported by his wife.    Goals of Care: Focused on treatment and cancer directed therapy.    Social History: Lives in [place] with [name].  He was in the [employer]	[place].  Enjoys woodworking.  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      Advance Care Planning: Did not discuss.  We will follow-up at future visits.  HCPOA:  Natural surrogate decision maker:  Living Will:  ACP note:     Objective     Opioid Risk Tool:    Male  Male    Family history of substance abuse      Alcohol  1  3    Illegal drugs  2  3    Rx drugs  4  4    Personal history of substance abuse      Alcohol  3  3    Illegal drugs  4  4    Rx drugs  5  5    Age between 16--45 years  1  1    History of preadolescent sexual abuse  3  0    Psychological disease      ADD, OCD, bipolar, schizophrenia  2  2    Depression  1  1       Total: Moderate risk  (<3 low risk, 4-7 moderate risk, >8 high risk)    Oncology History Overview Note   Referring/Local Oncologist: None    Diagnosis:Ph+ ALL    Genetics:    Karyotype/FISH:Abnormal Karyotype: 46,XY,t(9;22)(q34;q11.2)[1]/45,XY,der(7;9)(q10;q10)t(9;22)(q34;q11.2),der(22)t(9;22)[11]/46,sdl,+der(22)t(9;22)[5]/46,XY[3]     Abnormal FISH: A BCR/ABL1 interphase FISH assay shows an abnormal signal pattern in 97% of the 100 cells scored. 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  The findings support a diagnosis of leukemia and have implications for targeted therapy and for monitoring residual disease.      Molecular Genetics:  BCR-ABL1 p210 transcripts were detected at a level of 46.479 IS% ratio in bone marrow.  BCR-ABL1 p190 transcripts were detected at a level of 4 in 100,000 cells in bone marrow.    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)     01/24/2020 - 04/02/2020 Chemotherapy    IP/OP LEUKEMIA GRAAPH-2005 + RITUXIMAB < 60 YO  rituximab hypercvad (odd and even course)     02/24/2020 Remission    CR with PCR MRD+; marrow showing 70% blasts with <1% blasts, negative flow MRD, BCR-ABL p210 PCR 0.197%; CNS negative for blasts     03/09/2020 Progression    CSF - rare blast ID'ed - IT chemo given    03/13/20 - IT chemo, CSF negative     04/16/2020 Biopsy    BM Bx with 40-50% cellularity, <1% blasts, MRD flow cytometry negative, BCR-ABL p210 transcripts 0.036%     05/28/2020 - 05/28/2020 Chemotherapy  OP AML - CNS THERAPY (INTRATHECAL CYTARABINE, INTRATHECAL METHOTREXATE, OR INTRATHECAL TRIPLE)  Select one of the following: cytarabine IT 100 mg with hydrocortisone 50 mg, cytarabine IT 40 mg with methotrexate 15 mg with hydrocortisone 50 mg, OR methotrexate IT 12 mg with hydrocortisone 50 mg     06/19/2020 -  Chemotherapy    IP/OP LEUKEMIA BLINATUMOMAB 7-DAY INFUSION (MINIMAL RESIDUAL DISEASE; WT >= 22 KG) (HOME INFUSION)      Cycles 1*-4: Blinatumomab 28 mcg/day Days 1-28 of 6-week cycle.  *Given in the inpatient setting on Days 1-3 on Cycle 1 and Days 1-2 on Cycle 2, while other treatment days are given in the outpatient setting.    Cycle 1 MRD Dosing     07/18/2020 Adverse Reaction    Hospitalization: fevers. Pneumonia     07/23/2020 Biopsy    Diagnosis  Bone marrow, right iliac, aspiration and biopsy  -   Normocellular bone marrow (50%) with trilineage hematopoiesis and 1% blasts by manual aspirate differential  -   Flow cytometry MRD analysis reveals no definitive immunophenotypic evidence of residual B lymphoblastic leukemia   - BCR-ABL p210 0.006%             Patient Active Problem List   Diagnosis   ??? Tobacco use disorder   ??? AKI (acute kidney injury) (CMS-HCC)   ??? Transaminitis   ??? 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 Hyperphosphatemia   ??? MRSA bacteremia   ??? Septic shock due to Staphylococcus aureus (CMS-HCC)   ??? Hyperkalemia   ??? Hiccups   ??? Hypernatremia   ??? Red blood cell antibody positive   ??? Hypokalemia   ??? Hypomagnesemia   ??? COVID-19   ??? Dyspnea   ??? Recurrent major depressive disorder, in partial remission (CMS-HCC)   ??? PTSD (post-traumatic stress disorder)   ??? Anxiety   ??? Insomnia   ??? Fever   ??? Immunocompromised (CMS-HCC)   ??? Hypogammaglobulinemia (CMS-HCC)       Past Medical History:   Diagnosis Date   ??? 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 BONE MARROW BIOPSY & ASPIRATION  01/21/2020        ??? IR INSERT PORT AGE GREATER THAN 5 YRS  03/10/2020    IR INSERT PORT AGE GREATER THAN 5 YRS 03/10/2020 [name], MD IMG VIR H&V [facility]   ??? PR BRONCHOSCOPY,DIAGNOSTIC W LAVAGE Bilateral 07/20/2020    Procedure: BRONCHOSCOPY, RIGID OR FLEXIBLE, INCLUDE FLUOROSCOPIC GUIDANCE WHEN PERFORMED; W/BRONCHIAL ALVEOLAR LAVAGE WITH MODERATE SEDATION;  Surgeon: Dellis Filbert, MD;  Location: BRONCH PROCEDURE LAB [facility];  Service: Pulmonary       Current Outpatient Medications   Medication Sig Dispense Refill   ??? acetaminophen (TYLENOL) 325 MG tablet Take 650 mg by mouth every six (6) hours as needed for pain.      ??? amLODIPine (NORVASC) 10 MG tablet Take 1 tablet (10 mg total) by mouth daily. 30 tablet 0   ??? aspirin 81 MG chewable tablet Chew 1 tablet (81 mg total) daily. 36 tablet 11   ??? carvediloL (COREG) 12.5 MG tablet TAKE 1 TABLET BY MOUTH TWO TIMES A DAY. 180 tablet 1   ??? clonazePAM (KLONOPIN) 0.5 MG tablet Take 1 tablet (0.5 mg total) by mouth two (2) times a day as needed for anxiety or sleep. 60 tablet 0   ???  gabapentin (NEURONTIN) 300 MG capsule Take 1 capsule (300 mg total) by mouth Three (3) times a day. 90 capsule 0   ??? hydrOXYzine (ATARAX) 25 MG tablet Take 1 tablet (25 mg total) by mouth every six (6) hours as needed. 60 tablet 1   ??? isavuconazonium sulfate (CRESEMBA) 186 mg cap capsule Take 2 capsules (372 mg total) by mouth daily. 56 capsule 2   ??? nortriptyline (PAMELOR) 50 MG capsule Take 1 capsule (50 mg total) by mouth nightly. 30 capsule 11   ??? ondansetron (ZOFRAN) 8 MG tablet Take 1 tablet (8 mg total) by mouth every eight (8) hours as needed for nausea (or itching). 60 tablet 2   ??? oxyCODONE (OXYCONTIN) 15 mg 12 hr crush resistant ER/CR tablet Take 1 tablet (15 mg total) by mouth two (2) times a day. 60 tablet 0   ??? PONATinib (ICLUSIG) 30 mg tablet Take 1 tablet (30 mg total) by mouth daily. Swallow tablets whole. Do not crush, break, cut or chew tablets. 30 tablet 5   ??? predniSONE (DELTASONE) 10 MG tablet Take 1.5 tablets (15 mg total) by mouth daily for 28 days. 60 tablet 0   ??? prochlorperazine (COMPAZINE) 10 MG tablet Take 1 tablet (10 mg total) by mouth every six (6) hours as needed for nausea (if no relief from zofran (ondansetron)). 60 tablet 1   ??? sulfamethoxazole-trimethoprim (BACTRIM DS) 800-160 mg per tablet Take 1 tablet (160 mg of trimethoprim total) by mouth 2 times a day on Saturday, Sunday. For prophylaxis while on chemo. 48 tablet 3   ??? valACYclovir (VALTREX) 500 MG tablet Take 1 tablet (500 mg total) by mouth daily. 90 tablet 3     No current facility-administered medications for this visit.       Allergies:   Allergies   Allergen Reactions   ??? Bupropion Hcl Other (See Comments)     Per patient out of touch with reality, suicidal, homicidal   ??? 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 Onion Anaphylaxis   ??? Vancomycin Analogues      Hearing loss with Lasix  Other reaction(s): Other (See Comments)  Hearing loss  lasix   ??? Bismuth Subsalicylate Nausea And Vomiting   ??? Ceftaroline Fosamil Rash and Other (See Comments)     Rash X 2 and pancytopenia 02/2020   ??? Furosemide      With Vancomycin caused hearing loss  Other reaction(s): Other (See Comments)  With Vancomycin caused hearing loss       Family History:  Cancer-related family history is negative for Melanoma.  He indicated that the status of his neg hx is unknown.      REVIEW OF SYSTEMS:  A comprehensive review of 14 systems was negative except for pertinent positives noted in HPI.      PHYSICAL EXAM:   Vital signs for this encounter: VS reviewed in EPIC.  GEN: Awake and alert; no acute distress but frustrated  PSYCH: Alert. Normal thought processes. Euthymic.  HEENT: Pupils equally round without scleral icterus. No eye drainage. No facial asymmetry.  LUNGS: No increased work of breathing or accessory muscle use.  SKIN: No rashes, petechiae or jaundice noted on visible skin.  NEURO: Ambulatory. Grossly symmetric extremity movement. Memory intact. No seizures or myoclonus.    Lab Results   Component Value Date    CREATININE 1.95 (H) 08/31/2020     Lab Results   Component Value Date  ALKPHOS 79 08/31/2020    BILITOT 0.2 (L) 08/31/2020    BILIDIR 0.10 08/11/2020    PROT 6.5 08/31/2020    ALBUMIN 4.0 08/31/2020    ALT 15 08/31/2020    AST 19 08/31/2020                The patient reports they are currently: at home. I spent 25 minutes on the real-time audio and video with the patient on the date of service. I spent an additional 10 minutes on pre- and post-visit activities on the date of service.     The patient was physically located in [place] or a state in which I am permitted to provide care. The patient and/or parent/guardian understood that s/he may incur co-pays and cost sharing, and agreed to the telemedicine visit. The visit was reasonable and appropriate under the circumstances given the patient's presentation at the time.    The patient and/or parent/guardian has been advised of the potential risks and limitations of this mode of treatment (including, but not limited to, the absence of in-person examination) and has agreed to be treated using telemedicine. The patient's/patient's family's questions regarding telemedicine have been answered.     If the visit was completed in an ambulatory setting, the patient and/or parent/guardian has also been advised to contact their provider???s office for worsening conditions, and seek emergency medical treatment and/or call 911 if the patient deems either necessary.    Marita [place] <D

## 2020-09-07 ENCOUNTER — Ambulatory Visit: Admit: 2020-09-07 | Discharge: 2020-09-08 | Payer: MEDICAID

## 2020-09-07 MED ADMIN — heparin, porcine (PF) 100 unit/mL injection 500 Units: 500 [IU] | INTRAVENOUS | @ 19:00:00 | Stop: 2020-09-08

## 2020-09-07 NOTE — Unmapped (Signed)
Pt arrived to infusion. Reports feeling well. Home infusion disconnected. 10ml wasted. Port flushed, heparin locked. Pump returned to [facility]. Port deaccessed. Pt discharged with no further needs.

## 2020-09-08 DIAGNOSIS — C9100 Acute lymphoblastic leukemia not having achieved remission: Principal | ICD-10-CM

## 2020-09-08 MED FILL — ICLUSIG 30 MG TABLET: ORAL | 30 days supply | Qty: 30 | Fill #3

## 2020-09-09 ENCOUNTER — Ambulatory Visit
Admit: 2020-09-09 | Discharge: 2020-09-10 | Payer: MEDICAID | Attending: Student in an Organized Health Care Education/Training Program | Primary: Student in an Organized Health Care Education/Training Program

## 2020-09-09 ENCOUNTER — Ambulatory Visit: Admit: 2020-09-09 | Discharge: 2020-09-10 | Payer: MEDICAID | Attending: Adult Health | Primary: Adult Health

## 2020-09-09 ENCOUNTER — Other Ambulatory Visit: Admit: 2020-09-09 | Discharge: 2020-09-10 | Payer: MEDICAID

## 2020-09-09 DIAGNOSIS — B49 Unspecified mycosis: Principal | ICD-10-CM

## 2020-09-09 DIAGNOSIS — C9100 Acute lymphoblastic leukemia not having achieved remission: Principal | ICD-10-CM

## 2020-09-09 LAB — CBC W/ AUTO DIFF
BASOPHILS ABSOLUTE COUNT: 0.2 10*9/L — ABNORMAL HIGH (ref 0.0–0.1)
BASOPHILS RELATIVE PERCENT: 0.9 %
EOSINOPHILS ABSOLUTE COUNT: 0.2 10*9/L (ref 0.0–0.5)
EOSINOPHILS RELATIVE PERCENT: 1 %
HEMATOCRIT: 36.9 % — ABNORMAL LOW (ref 39.0–48.0)
HEMOGLOBIN: 12.7 g/dL — ABNORMAL LOW (ref 12.9–16.5)
LYMPHOCYTES ABSOLUTE COUNT: 3.5 10*9/L (ref 1.1–3.6)
LYMPHOCYTES RELATIVE PERCENT: 19 %
MEAN CORPUSCULAR HEMOGLOBIN CONC: 34.5 g/dL (ref 32.0–36.0)
MEAN CORPUSCULAR HEMOGLOBIN: 33 pg — ABNORMAL HIGH (ref 25.9–32.4)
MEAN CORPUSCULAR VOLUME: 95.9 fL — ABNORMAL HIGH (ref 77.6–95.7)
MEAN PLATELET VOLUME: 7.8 fL (ref 6.8–10.7)
MONOCYTES ABSOLUTE COUNT: 1.5 10*9/L — ABNORMAL HIGH (ref 0.3–0.8)
MONOCYTES RELATIVE PERCENT: 8.5 %
NEUTROPHILS ABSOLUTE COUNT: 12.9 10*9/L — ABNORMAL HIGH (ref 1.8–7.8)
NEUTROPHILS RELATIVE PERCENT: 70.6 %
PLATELET COUNT: 226 10*9/L (ref 150–450)
RED BLOOD CELL COUNT: 3.85 10*12/L — ABNORMAL LOW (ref 4.26–5.60)
RED CELL DISTRIBUTION WIDTH: 17.7 % — ABNORMAL HIGH (ref 12.2–15.2)
WBC ADJUSTED: 18.2 10*9/L — ABNORMAL HIGH (ref 3.6–11.2)

## 2020-09-09 LAB — COMPREHENSIVE METABOLIC PANEL
ALBUMIN: 3.9 g/dL (ref 3.4–5.0)
ALKALINE PHOSPHATASE: 81 U/L (ref 46–116)
ALT (SGPT): 26 U/L (ref 10–49)
ANION GAP: 2 mmol/L — ABNORMAL LOW (ref 5–14)
AST (SGOT): 17 U/L (ref <=34)
BILIRUBIN TOTAL: 0.2 mg/dL — ABNORMAL LOW (ref 0.3–1.2)
BLOOD UREA NITROGEN: 18 mg/dL (ref 9–23)
BUN / CREAT RATIO: 10
CALCIUM: 9.4 mg/dL (ref 8.7–10.4)
CHLORIDE: 114 mmol/L — ABNORMAL HIGH (ref 98–107)
CO2: 22 mmol/L (ref 20.0–31.0)
CREATININE: 1.83 mg/dL — ABNORMAL HIGH
EGFR CKD-EPI (2021) MALE: 47 mL/min/{1.73_m2} — ABNORMAL LOW (ref >=60)
GLUCOSE RANDOM: 91 mg/dL (ref 70–179)
POTASSIUM: 4.2 mmol/L (ref 3.4–4.8)
PROTEIN TOTAL: 6.5 g/dL (ref 5.7–8.2)
SODIUM: 138 mmol/L (ref 135–145)

## 2020-09-09 LAB — SLIDE REVIEW

## 2020-09-11 NOTE — Unmapped (Signed)
As directed by [name], I have contacted the patient to review/clarify the following:    ?? As reviewed in the clinic visit with [name], NP on 09/09/2020, the following has been updated in conjunction with the plan of care:    ?? A repeat dose of IT chemotherapy is coordinated to be completed on 09/18/2020 with the procedure being done bedside in the infusion center.    ?? The 6-week dose of IVIG (Gammagard) last administered on 08/07/2020 is next due on 09/18/2020 and will be administered in conjunction with the other visits for that day.    ?? Cycle 3 of blinatumomab will be started as an outpatient on Monday, September 21, 2020 with all corresponding bag changes to be done once weekly at the infusion center for the upcoming cycle.    ?? Clinic follow-up with the Leukemia Care Team is next tentatively set during the week of 10/26/2020 along with same-day repeat lab work.    Otherwise, the patient expressed verbal understanding and had no other questions or concerns at this time.  He is in agreement with the plan outlined above and will contact the clinic back sooner should he have any other issues.    No other actions taken and this updates been shared with the Leukemia Care Team.

## 2020-09-11 NOTE — Unmapped (Signed)
Hello Adam Keith,    This is the BCR-ABL result. In case you can't see the report, the level is 0.005%. The result from your most recent bone marrow biopsy was 0.006%. This is a stable, low level. We will continue to check each cycle.    I spoke with [name] and we will continue the ponatinib at the current dose of 30mg . If the BCR-ABL becomes undetectable, the plan will be to reduce the dose to 15mg .    Also, when you come in next Friday for the IT therapy, we will also plan a infusion of IVIG (to lower the risk of infections). We will plan to include that each 6 weeks.     Adam Keith, AGPCNP-BC  Nurse Practitioner  Hematology/Oncology  [facility]

## 2020-09-14 LAB — ISAVUCONAZOLE: ISAVUCONAZOLE: 2.6 ug/mL

## 2020-09-18 ENCOUNTER — Ambulatory Visit: Admit: 2020-09-18 | Discharge: 2020-09-19 | Payer: MEDICAID | Attending: Adult Health | Primary: Adult Health

## 2020-09-18 ENCOUNTER — Other Ambulatory Visit: Admit: 2020-09-18 | Discharge: 2020-09-19 | Payer: MEDICAID

## 2020-09-18 ENCOUNTER — Ambulatory Visit: Admit: 2020-09-18 | Discharge: 2020-09-19 | Payer: MEDICAID

## 2020-09-18 DIAGNOSIS — C9100 Acute lymphoblastic leukemia not having achieved remission: Principal | ICD-10-CM

## 2020-09-18 LAB — SLIDE REVIEW

## 2020-09-18 LAB — CBC W/ AUTO DIFF
BASOPHILS ABSOLUTE COUNT: 0.2 10*9/L — ABNORMAL HIGH (ref 0.0–0.1)
BASOPHILS RELATIVE PERCENT: 1 %
EOSINOPHILS ABSOLUTE COUNT: 0.6 10*9/L — ABNORMAL HIGH (ref 0.0–0.5)
EOSINOPHILS RELATIVE PERCENT: 3 %
HEMATOCRIT: 37.2 % — ABNORMAL LOW (ref 39.0–48.0)
HEMOGLOBIN: 13.1 g/dL (ref 12.9–16.5)
LYMPHOCYTES ABSOLUTE COUNT: 1.7 10*9/L (ref 1.1–3.6)
LYMPHOCYTES RELATIVE PERCENT: 9 %
MEAN CORPUSCULAR HEMOGLOBIN CONC: 35.1 g/dL (ref 32.0–36.0)
MEAN CORPUSCULAR HEMOGLOBIN: 33.4 pg — ABNORMAL HIGH (ref 25.9–32.4)
MEAN CORPUSCULAR VOLUME: 95.1 fL (ref 77.6–95.7)
MEAN PLATELET VOLUME: 7.9 fL (ref 6.8–10.7)
MONOCYTES ABSOLUTE COUNT: 0.8 10*9/L (ref 0.3–0.8)
MONOCYTES RELATIVE PERCENT: 4.4 %
NEUTROPHILS ABSOLUTE COUNT: 15.2 10*9/L — ABNORMAL HIGH (ref 1.8–7.8)
NEUTROPHILS RELATIVE PERCENT: 82.6 %
PLATELET COUNT: 256 10*9/L (ref 150–450)
RED BLOOD CELL COUNT: 3.91 10*12/L — ABNORMAL LOW (ref 4.26–5.60)
RED CELL DISTRIBUTION WIDTH: 17.9 % — ABNORMAL HIGH (ref 12.2–15.2)
WBC ADJUSTED: 18.4 10*9/L — ABNORMAL HIGH (ref 3.6–11.2)

## 2020-09-18 MED ADMIN — heparin, porcine (PF) 100 unit/mL injection 500 Units: 500 [IU] | INTRAVENOUS | Stop: 2020-09-19

## 2020-09-18 MED ADMIN — cytarabine (PF) (ARA-C) 40 mg, methotrexate (Preservative Free) 15 mg, hydrocortisone sod succ (Solu-CORTEF) 50 mg in sodium chloride (NS) 0.9 % 4 mL INTRATHECAL syringe: INTRATHECAL | @ 19:00:00 | Stop: 2020-09-18

## 2020-09-18 MED ADMIN — acetaminophen (TYLENOL) tablet 650 mg: 650 mg | ORAL | @ 19:00:00 | Stop: 2020-09-18

## 2020-09-18 MED ADMIN — diphenhydrAMINE (BENADRYL) capsule/tablet 25 mg: 25 mg | ORAL | @ 20:00:00 | Stop: 2020-09-18

## 2020-09-18 MED ADMIN — immune globulin, IgA less than 1 microgram/mL (GAMMAGARD S/D) 5% intravenous solution 40 g: 40 g | INTRAVENOUS | @ 21:00:00 | Stop: 2020-09-18

## 2020-09-18 MED ADMIN — HYDROmorphone (DILAUDID) injection 0.6 mg: .6 mg | INTRAVENOUS | @ 19:00:00 | Stop: 2020-09-18

## 2020-09-18 NOTE — Unmapped (Signed)
Lumbar Puncture Procedure Note  Name: Adam Keith  Date: 09/18/2020  MRN: [id number]  DOB: April 22, 1980    Diagnosis: Ph+ B-ALL    Indications: IT PPX    Premed: Dilaudid 0.6mg  IV once    Pre-procedural Planning   ??  Contraindications to performing lumbar puncture: none  ??  Imaging prior to lumbar puncture: Generally, imaging should be obtained prior to performing a lumbar puncture if the patient is > 41 years old, immunocompromised, has had a seizure within one week of presentation, has an abnormal level of consciousness, or an abnormal neurologic exam (including papilledema).   ??  Antiplatelet Agents: This patient is not on an antiplatelet agent.  ??  Systemic Anticoagulation: This patient is not on full systemic anticoagulation.  ??  Significant Labs:  Plt =  256k      ??  Consent: Informed consent was obtained. Risks of the procedure were discussed including: infection, bleeding, pain and headache.    A time-out in which His patient identifiers were checked by 2 providers was performed.    Procedure Details   ??  The patient was positioned under sterile conditions. Betadine solution and sterile drapes were utilized. Local anesthesia with 1% lidocaine was applied subcutaneously then deep to the skin. A spinal needle was inserted at the L3 - L4 interspace. Spinal fluid was obtained and sent to the laboratory.    Intrathecal MTX, ara-c, hydrocortisone was administered over 5 min.    The spinal needle with trocar was removed with minimal bleeding noted upon removal. A sterile bandage was placed over the puncture site after holding pressure.      Findings  6mL of clear spinal fluid was obtained.    Complications:  None; patient tolerated the procedure well.          Condition: stable    Plan  Bed rest for 1 hour.

## 2020-09-18 NOTE — Unmapped (Signed)
Addended by: Leane Platt on: 09/18/2020 01:44 PM     Modules accepted: Orders

## 2020-09-18 NOTE — Unmapped (Signed)
Labs found to be within parameters for treatment today. Request for drug sent to pharmacy.  Labs are within parameters today for the patient to have their procedure. Patient sent on to procedure appointment.

## 2020-09-18 NOTE — Unmapped (Signed)
Port accessed by [employer], labs drawn and sent.

## 2020-09-19 NOTE — Unmapped (Signed)
Patient arrived to room 10 for LP with IT chemo. Patient consented for procedure by NP, all questions/concerns addressed. Time out completed and premedicated. Procedure completed w/o complication, patient to lay flat until 1600.

## 2020-09-19 NOTE — Unmapped (Signed)
Patient in bed 10 recovering from LP with IT chemo. Patient lying flat in NAD. Access of port intact with blood return. Pre-medicated per IVIG therapy plan orders. Patient completed and tolerated treatment. Port de-accessed after 500 unit Heparin flush, site covered with band-aid dressing. AVS declined. Pt discharged to home, NAD.

## 2020-09-21 ENCOUNTER — Other Ambulatory Visit: Admit: 2020-09-21 | Discharge: 2020-09-22 | Payer: MEDICAID

## 2020-09-21 ENCOUNTER — Ambulatory Visit: Admit: 2020-09-21 | Discharge: 2020-09-22 | Payer: MEDICAID

## 2020-09-21 ENCOUNTER — Ambulatory Visit: Admit: 2020-09-21 | Discharge: 2020-09-22 | Payer: MEDICAID | Attending: Pharmacist | Primary: Pharmacist

## 2020-09-21 DIAGNOSIS — C9100 Acute lymphoblastic leukemia not having achieved remission: Principal | ICD-10-CM

## 2020-09-21 DIAGNOSIS — C9101 Acute lymphoblastic leukemia, in remission: Principal | ICD-10-CM

## 2020-09-21 LAB — CBC W/ AUTO DIFF
BASOPHILS ABSOLUTE COUNT: 0.1 10*9/L (ref 0.0–0.1)
BASOPHILS RELATIVE PERCENT: 1.1 %
EOSINOPHILS ABSOLUTE COUNT: 0.5 10*9/L (ref 0.0–0.5)
EOSINOPHILS RELATIVE PERCENT: 4.2 %
HEMATOCRIT: 37.2 % — ABNORMAL LOW (ref 39.0–48.0)
HEMOGLOBIN: 13 g/dL (ref 12.9–16.5)
LYMPHOCYTES ABSOLUTE COUNT: 2.2 10*9/L (ref 1.1–3.6)
LYMPHOCYTES RELATIVE PERCENT: 18.7 %
MEAN CORPUSCULAR HEMOGLOBIN CONC: 35 g/dL (ref 32.0–36.0)
MEAN CORPUSCULAR HEMOGLOBIN: 33.3 pg — ABNORMAL HIGH (ref 25.9–32.4)
MEAN CORPUSCULAR VOLUME: 95.1 fL (ref 77.6–95.7)
MEAN PLATELET VOLUME: 7.8 fL (ref 6.8–10.7)
MONOCYTES ABSOLUTE COUNT: 0.2 10*9/L — ABNORMAL LOW (ref 0.3–0.8)
MONOCYTES RELATIVE PERCENT: 1.3 %
NEUTROPHILS ABSOLUTE COUNT: 8.6 10*9/L — ABNORMAL HIGH (ref 1.8–7.8)
NEUTROPHILS RELATIVE PERCENT: 74.7 %
PLATELET COUNT: 213 10*9/L (ref 150–450)
RED BLOOD CELL COUNT: 3.91 10*12/L — ABNORMAL LOW (ref 4.26–5.60)
RED CELL DISTRIBUTION WIDTH: 17.3 % — ABNORMAL HIGH (ref 12.2–15.2)
WBC ADJUSTED: 11.6 10*9/L — ABNORMAL HIGH (ref 3.6–11.2)

## 2020-09-21 LAB — COMPREHENSIVE METABOLIC PANEL
ALBUMIN: 3.9 g/dL (ref 3.4–5.0)
ALKALINE PHOSPHATASE: 75 U/L (ref 46–116)
ALT (SGPT): 23 U/L (ref 10–49)
ANION GAP: 3 mmol/L — ABNORMAL LOW (ref 5–14)
AST (SGOT): 21 U/L (ref <=34)
BILIRUBIN TOTAL: 0.3 mg/dL (ref 0.3–1.2)
BLOOD UREA NITROGEN: 21 mg/dL (ref 9–23)
BUN / CREAT RATIO: 11
CALCIUM: 9.5 mg/dL (ref 8.7–10.4)
CHLORIDE: 107 mmol/L (ref 98–107)
CO2: 24 mmol/L (ref 20.0–31.0)
CREATININE: 1.86 mg/dL — ABNORMAL HIGH
EGFR CKD-EPI (2021) MALE: 46 mL/min/{1.73_m2} — ABNORMAL LOW (ref >=60)
GLUCOSE RANDOM: 91 mg/dL (ref 70–179)
POTASSIUM: 3.9 mmol/L (ref 3.4–4.8)
PROTEIN TOTAL: 7 g/dL (ref 5.7–8.2)
SODIUM: 134 mmol/L — ABNORMAL LOW (ref 135–145)

## 2020-09-21 LAB — MAGNESIUM: MAGNESIUM: 1.8 mg/dL (ref 1.6–2.6)

## 2020-09-21 LAB — C-REACTIVE PROTEIN: C-REACTIVE PROTEIN: 14 mg/L — ABNORMAL HIGH (ref <=10.0)

## 2020-09-21 LAB — PHOSPHORUS: PHOSPHORUS: 4 mg/dL (ref 2.4–5.1)

## 2020-09-21 MED ORDER — AMOXICILLIN 875 MG-POTASSIUM CLAVULANATE 125 MG TABLET
ORAL_TABLET | Freq: Two times a day (BID) | ORAL | 0 refills | 7.00000 days | Status: CP
Start: 2020-09-21 — End: 2020-09-28

## 2020-09-21 MED ADMIN — heparin, porcine (PF) 100 unit/mL injection 500 Units: 500 [IU] | INTRAVENOUS | @ 18:00:00 | Stop: 2020-09-22

## 2020-09-21 MED FILL — CRESEMBA 186 MG CAPSULE: ORAL | 28 days supply | Qty: 56 | Fill #2

## 2020-09-21 NOTE — Unmapped (Signed)
Port accessed.  Labs drawn & sent for analysis.  To next appt.  Care provided by Shelly Wood RN.

## 2020-09-21 NOTE — Unmapped (Signed)
Pt to clinic for treatment and evaluation.  Pt report having nasal congestion and body aches.  FLU/COVID swap resulted in a positive COVID test.  Pt will not be receiving treatment today per APP.  Port de-accessed.  Pt left ambulatory with spouse.

## 2020-09-21 NOTE — Unmapped (Addendum)
Your test results came back positive for SARS COVID-19 this could be the likely cause of your worsening of shortness of breath, productive cough and body aches. Be sure to isolate yourself,     When you are interested in quitting smoking please let a healthcare provider know or call 1-800-QUIT NOW for assistance.     We will hold treatment today.     You will be contacted by your primary team to schedule an appointment for next week to assess your symptoms.     For heartburn/indigestion it is okay to restart Famotidine once daily. Be sure to implement some of the diet changes as well (avoiding spicy, greasy foods, carbonated beverages etc) as this will help improve your symptoms too.         Montel Culver, ANP  ADVANCED PRACTICE PROVIDER FOR INFUSION PROGRAM

## 2020-09-21 NOTE — Unmapped (Signed)
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 presents to clinic today with [name] and following up today in consideration for Cycle 3 of Blinaumomab/Ponatinib. [name] reports doing very poorly this last few weeks. He has been feeling very fatigued, SOB, increased cough, blocked sinus, and yellow/green sputum. This started 2 weeks ago and has progressively gotten worse. He has some chest pain but attributes this to the cough. He denies fevers, and chills at home. He reports no other concerns but wishes to address what seems to be an upper respiratory infection. Patient continues ponatinib and aspirin with no missed doses. Current medications reviewed today.    On labs, Hgb 13, PLT 213, ANC 8.6. CMP notable for SCr 1.86 (stable). BP 150/96 and HR 88. COVID PCR+ Today     Oncologic History:  Oncology History Overview Note   Referring/Local Oncologist: None    Diagnosis:Ph+ ALL    Genetics:    Karyotype/FISH:Abnormal Karyotype: 46,XY,t(9;22)(q34;q11.2)[1]/45,XY,der(7;9)(q10;q10)t(9;22)(q34;q11.2),der(22)t(9;22)[11]/46,sdl,+der(22)t(9;22)[5]/46,XY[3]     Abnormal FISH: A BCR/ABL1 interphase FISH assay shows an abnormal signal pattern in 97% of the 100 cells scored. 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  The findings support a diagnosis of leukemia and have implications for targeted therapy and for monitoring residual disease.      Molecular Genetics:  BCR-ABL1 p210 transcripts were detected at a level of 46.479 IS% ratio in bone marrow.  BCR-ABL1 p190 transcripts were detected at a level of 4 in 100,000 cells in bone marrow.    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)     01/24/2020 - 04/02/2020 Chemotherapy    IP/OP LEUKEMIA GRAAPH-2005 + RITUXIMAB < 60 YO  rituximab hypercvad (odd and even course)     02/24/2020 Remission    CR with PCR MRD+; marrow showing 70% blasts with <1% blasts, negative flow MRD, BCR-ABL p210 PCR 0.197%; CNS negative for blasts     03/09/2020 Progression    CSF - rare blast ID'ed - IT chemo given    03/13/20 - IT chemo, CSF negative     04/16/2020 Biopsy    BM Bx with 40-50% cellularity, <1% blasts, MRD flow cytometry negative, BCR-ABL p210 transcripts 0.036%     05/28/2020 - 05/28/2020 Chemotherapy    OP AML - CNS THERAPY (INTRATHECAL CYTARABINE, INTRATHECAL METHOTREXATE, OR INTRATHECAL TRIPLE)  Select one of the following: cytarabine IT 100 mg with hydrocortisone 50 mg, cytarabine IT 40 mg with methotrexate 15 mg with hydrocortisone 50 mg, OR methotrexate IT 12 mg with hydrocortisone 50 mg     06/19/2020 -  Chemotherapy    IP/OP LEUKEMIA BLINATUMOMAB 7-DAY INFUSION (MINIMAL RESIDUAL DISEASE; WT >= 22 KG) (HOME INFUSION)      Cycles 1*-4: Blinatumomab 28 mcg/day Days 1-28 of 6-week cycle.  *Given in  the inpatient setting on Days 1-3 on Cycle 1 and Days 1-2 on Cycle 2, while other treatment days are given in the outpatient setting.    Cycle 1 MRD Dosing     07/18/2020 Adverse Reaction    Hospitalization: fevers. Pneumonia     07/23/2020 Biopsy    Diagnosis  Bone marrow, right iliac, aspiration and biopsy  -   Normocellular bone marrow (50%) with trilineage hematopoiesis and 1% blasts by manual aspirate differential  -   Flow cytometry MRD analysis reveals no definitive immunophenotypic evidence of residual B lymphoblastic leukemia   - BCR-ABL p210 0.006%         08/10/2020 -  Chemotherapy    Cycle 2 blinatumomab-ponatinib  Ponatinib 30mg     1 IT per cycle     ALL (acute lymphoblastic leukemia) (CMS-HCC)   06/11/2020 -  Chemotherapy    IP/OP LEUKEMIA BLINATUMOMAB 7-DAY INFUSION (MINIMAL RESIDUAL DISEASE; WT >= 22 KG) (HOME INFUSION)  Cycles 1*-4: Blinatumomab 28 mcg/day Days 1-28 of 6-week cycle.  *Given in the inpatient setting on Days 1-3 on Cycle 1 and Days 1-2 on Cycle 2, while other treatment days are given in the outpatient setting.     09/21/2020 Initial Diagnosis    ALL (acute lymphoblastic leukemia) (CMS-HCC)         Weight and Vitals:  Wt Readings from Last 3 Encounters:   09/21/20 (!) 112.9 kg (248 lb 14.4 oz)   09/21/20 (!) 112.9 kg (248 lb 14.4 oz)   09/18/20 (!) 112.4 kg (247 lb 12.8 oz)     Temp Readings from Last 3 Encounters:   09/21/20 36.5 ??C (97.7 ??F) (Oral)   09/21/20 36.6 ??C (97.8 ??F) (Temporal)   09/18/20 36.5 ??C (97.7 ??F) (Oral)     BP Readings from Last 3 Encounters:   09/21/20 176/98   09/21/20 150/96   09/18/20 169/91     Pulse Readings from Last 3 Encounters:   09/21/20 77   09/21/20 88   09/18/20 89       Pertinent Labs:  Hospital Outpatient Visit on 09/21/2020   Component Date Value Ref Range Status   ??? SARS-CoV-2 PCR 09/21/2020 Positive (A) Negative Final   ??? Influenza A 09/21/2020 Negative  Negative Final   ??? Influenza B 09/21/2020 Negative  Negative Final   ??? RSV 09/21/2020 Negative  Negative Final   ??? SARS-CoV-2 CT Value 09/21/2020 37.0   Final    SARS-CoV-2 cycle numbers (Ct values) should not be used to guide clinical care without infectious disease consultation. Cycle numbers are provided solely for use by infectious diseases. Variation between testing platforms, laboratories, and sampling techniques limits comparison across tests. Validated clinical algorithms using cycle numbers are not currently available.   Lab on 09/21/2020   Component Date Value Ref Range Status   ??? Sodium 09/21/2020 134 (A) 135 - 145 mmol/L Final   ??? Potassium 09/21/2020 3.9  3.4 - 4.8 mmol/L Final   ??? Chloride 09/21/2020 107  98 - 107 mmol/L Final   ??? CO2 09/21/2020 24.0  20.0 - 31.0 mmol/L Final   ??? Anion Gap 09/21/2020 3 (A) 5 - 14 mmol/L Final   ??? BUN 09/21/2020 21  9 - 23 mg/dL Final   ??? Creatinine 09/21/2020 1.86 (A) 0.60 - 1.10 mg/dL Final   ??? BUN/Creatinine Ratio 09/21/2020 11   Final   ??? eGFR CKD-EPI (2021) Male 09/21/2020 46 (A) >=60 mL/min/1.60m2 Final    eGFR calculated with CKD-EPI 2021 equation in  accordance with [employer] and [employer] of Nephrology Task Force recommendations.   ??? Glucose 09/21/2020 91  70 - 179 mg/dL Final   ??? Calcium 08/65/7846 9.5  8.7 - 10.4 mg/dL Final   ??? Albumin 96/29/5284 3.9  3.4 - 5.0 g/dL Final   ??? Total Protein 09/21/2020 7.0  5.7 - 8.2 g/dL Final   ??? Total Bilirubin 09/21/2020 0.3  0.3 - 1.2 mg/dL Final   ??? AST 13/24/4010 21  <=34 U/L Final   ??? ALT 09/21/2020 23  10 - 49 U/L Final   ??? Alkaline Phosphatase 09/21/2020 75  46 - 116 U/L Final   ??? Magnesium 09/21/2020 1.8  1.6 - 2.6 mg/dL Final   ??? Phosphorus 09/21/2020 4.0  2.4 - 5.1 mg/dL Final   ??? CRP 27/25/3664 14.0 (A) <=10.0 mg/L Final   ??? WBC 09/21/2020 11.6 (A) 3.6 - 11.2 10*9/L Final   ??? RBC 09/21/2020 3.91 (A) 4.26 - 5.60 10*12/L Final   ??? HGB 09/21/2020 13.0  12.9 - 16.5 g/dL Final   ??? HCT 40/34/7425 37.2 (A) 39.0 - 48.0 % Final   ??? MCV 09/21/2020 95.1  77.6 - 95.7 fL Final   ??? MCH 09/21/2020 33.3 (A) 25.9 - 32.4 pg Final   ??? MCHC 09/21/2020 35.0  32.0 - 36.0 g/dL Final   ??? RDW 95/63/8756 17.3 (A) 12.2 - 15.2 % Final   ??? MPV 09/21/2020 7.8  6.8 - 10.7 fL Final   ??? Platelet 09/21/2020 213  150 - 450 10*9/L Final   ??? Neutrophils % 09/21/2020 74.7  % Final   ??? Lymphocytes % 09/21/2020 18.7  % Final   ??? Monocytes % 09/21/2020 1.3  % Final   ??? Eosinophils % 09/21/2020 4.2  % Final   ??? Basophils % 09/21/2020 1.1  % Final   ??? Absolute Neutrophils 09/21/2020 8.6 (A) 1.8 - 7.8 10*9/L Final   ??? Absolute Lymphocytes 09/21/2020 2.2  1.1 - 3.6 10*9/L Final   ??? Absolute Monocytes 09/21/2020 0.2 (A) 0.3 - 0.8 10*9/L Final   ??? Absolute Eosinophils 09/21/2020 0.5  0.0 - 0.5 10*9/L Final   ??? Absolute Basophils 09/21/2020 0.1  0.0 - 0.1 10*9/L Final   ??? Anisocytosis 09/21/2020 Slight (A) Not Present Final       Allergies:   Allergies   Allergen Reactions   ??? Bupropion Hcl Other (See Comments)     Per patient out of touch with reality, suicidal, homicidal   ??? 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 Onion Anaphylaxis   ??? Vancomycin Analogues      Hearing loss with Lasix  Other reaction(s): Other (See Comments)  Hearing loss  lasix   ??? Bismuth Subsalicylate Nausea And Vomiting   ??? Ceftaroline Fosamil Rash and Other (See Comments)     Rash X 2 and pancytopenia 02/2020   ??? Furosemide      With Vancomycin caused hearing loss  Other reaction(s): Other (See Comments)  With Vancomycin caused hearing loss       Drug Interactions:   - caution with addition of strong 3A4 inh with ponatinib - currently on Cresemba so no dose reductions needed as this is a moderate 3A4 inh      Current Medications:  Current Outpatient Medications   Medication Sig Dispense Refill   ??? acetaminophen (TYLENOL) 325 MG tablet Take 650 mg by mouth every six (6) hours as needed for pain.      ???  amLODIPine (NORVASC) 10 MG tablet Take 1 tablet (10 mg total) by mouth daily. 30 tablet 0   ??? aspirin 81 MG chewable tablet Chew 1 tablet (81 mg total) daily. 36 tablet 11   ??? carvediloL (COREG) 12.5 MG tablet TAKE 1 TABLET BY MOUTH TWO TIMES A DAY. 180 tablet 1   ??? clonazePAM (KLONOPIN) 0.5 MG tablet Take 1 tablet (0.5 mg total) by mouth two (2) times a day as needed for anxiety or sleep. 60 tablet 0   ??? gabapentin (NEURONTIN) 300 MG capsule Take 300 mg by mouth Three (3) times a day.     ??? HYDROmorphone (DILAUDID) 2 MG tablet Take 1 tablet (2 mg total) by mouth every six (6) hours as needed for pain,severe (7-10) for up to 28 days. 30 tablet 0   ??? HYDROmorphone (DILAUDID) 2 MG tablet Take 1 tablet (2 mg total) by mouth every six (6) hours as needed for pain,severe (7-10) for up to 28 days. 30 tablet 0   ??? hydrOXYzine (ATARAX) 25 MG tablet Take 1 tablet (25 mg total) by mouth every six (6) hours as needed. 60 tablet 1   ??? isavuconazonium sulfate (CRESEMBA) 186 mg cap capsule Take 2 capsules (372 mg total) by mouth daily. 56 capsule 2   ??? nortriptyline (PAMELOR) 50 MG capsule Take 1 capsule (50 mg total) by mouth nightly. 30 capsule 11   ??? ondansetron (ZOFRAN) 8 MG tablet Take 1 tablet (8 mg total) by mouth every eight (8) hours as needed for nausea (or itching). 60 tablet 2   ??? oxyCODONE (OXYCONTIN) 15 mg 12 hr crush resistant ER/CR tablet Take 1 tablet (15 mg total) by mouth two (2) times a day for 28 days. 56 tablet 0   ??? oxyCODONE (OXYCONTIN) 15 mg 12 hr crush resistant ER/CR tablet Take 1 tablet (15 mg total) by mouth two (2) times a day for 28 days. 56 tablet 0   ??? PONATinib (ICLUSIG) 30 mg tablet Take 1 tablet (30 mg total) by mouth daily. Swallow tablets whole. Do not crush, break, cut or chew tablets. 30 tablet 5   ??? predniSONE (DELTASONE) 10 MG tablet Take 1.5 tablets (15 mg total) by mouth daily for 28 days. 60 tablet 0   ??? prochlorperazine (COMPAZINE) 10 MG tablet Take 1 tablet (10 mg total) by mouth every six (6) hours as needed for nausea (if no relief from zofran (ondansetron)). 60 tablet 1   ??? sulfamethoxazole-trimethoprim (BACTRIM DS) 800-160 mg per tablet Take 1 tablet (160 mg of trimethoprim total) by mouth 2 times a day on Saturday, Sunday. For prophylaxis while on chemo. 48 tablet 3   ??? valACYclovir (VALTREX) 500 MG tablet Take 1 tablet (500 mg total) by mouth daily. 90 tablet 3     No current facility-administered medications for this visit.     Facility-Administered Medications Ordered in Other Visits   Medication Dose Route Frequency Provider Last Rate Last Admin   ??? heparin, porcine (PF) 100 unit/mL injection 500 Units  500 Units Intravenous Q30 Min PRN Guerry Bruin, MD   500 Units at 09/21/20 1424       Adherence: no barriers/issues identified, [name], his wife, is heavily involved in med administration      Assessment: 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 He presents today for Cycle 3. While patient is MRD-negative on flow, he still has positive BCR-ABL transcripts so decision to continue ponatinib 30 mg  daily as discussed with team with plans to reduce to 15 mg daily with undetectable BCR-ABL (or when deemed to be clinically appropriate). His current symptoms were concerning for URI and instructed patient to proceed to infusion clinic to be assessed by provider prior to starting blinatumomab.     Update: Rapid COVID-19 PCR test was positive. Given symptom onset is > 1 week out, he does not qualify for COVID-19 therapy at this time.    Plan:   - Will delay Cycle 3 blinatumomab given COVID-19 infection and reassess next week. Plan adjusted  - START Augmentin for a PNA course due to productive cough in setting of Covid infection. Supportive care therapy for COVID-19 infection as needed  - Continue ponatinib 30 mg daily. Plan to dose reduce to 15 mg daily when BCR-ABL is undetectable or sooner if team determine clinically necessary  - Continue bactrim and valtrex for ppx. Continue Cresemba for secondary ppx/treatment  - Visit with Gregary Signs 6/27 for consideration of C3. Moved plan and re-requested labs/infusion for 6/27 for C3D1 blina.      F/u:  Future Appointments   Date Time Provider Department Center   09/28/2020  9:30 AM Sean Jonathon [facility]   10/05/2020 11:00 AM ONCINF CHAIR 13 [id number]   10/12/2020 10:30 AM ADULT ONC LAB [facility]   10/12/2020 11:30 AM ONCINF CHAIR 28 [id number]   10/19/2020 11:30 AM ADULT ONC LAB [facility]   10/19/2020 12:30 PM ONCINF CHAIR 41 [id number]   10/21/2020  9:00 AM ADULT ONC LAB [facility]   10/21/2020 10:00 AM Sean Marrian Salvage, AGNP [id number]   10/21/2020 11:00 AM ONCINF CHAIR 04 [id number]   10/21/2020  1:30 PM Bernerd Limbo, FNP [id number]   10/21/2020  2:30 PM Everlena Cooper, MD [id number]   10/27/2020  1:00 PM Melissa Ronalee Belts, PMHNP [id number]   10/28/2020  1:15 PM ADULT ONC LAB [facility]   10/28/2020  2:15 PM Guerry Bruin, MD [id number]   10/30/2020 12:00 PM ADULT ONC LAB [facility]   10/30/2020  1:30 PM Vernie Murders, AGNP [id number]   11/13/2020  8:00 AM [facility] CT RM 5 [facility] [place]   11/13/2020 10:00 AM Park Breed, MD [id number]       I spent 20 minutes with [name] in direct patient care.      Jacqueline Dela Pe??a, PharmD  PGY-2 Oncology Pharmacy Resident      Manfred Arch, PharmD, BCOP, CPP  Pager: [phone]

## 2020-09-21 NOTE — Unmapped (Addendum)
As requested, I have contacted the patient to review/clarify the following:    ?? Over the past several weeks, Adam Keith has had ongoing cold-like symptoms presenting with:  ?? Productive cough with yellow-colored sputum  ?? Generalized body aches  ?? Ongoing low energy status  ?? Recurrent heartburn that is keeping him from sleeping  ?? Thankfully, the patient did not report any issues with fever/chills, abdominal pain/discomfort or difficulty breathing/shortness of breath.    ?? Follow-up with the Leukemia Care Team remains tentatively set next on Monday, September 21, 2020 to see Kendal Hymen, CCP, PhamD in the clinic setting and then proceed with a blinatumomab bag change.    ?? Moreover, arrangements have been made for Adam Keith to be seen urgently in the infusion center by one of the midlevel providers given his symptoms noted above.    No other actions taken at this time.

## 2020-09-21 NOTE — Unmapped (Addendum)
Infusion Center Progress Note    Patient Name: Adam Keith  Patient Age: 40 y.o.  Encounter Date: 09/21/2020    Reason for visit  Chief Complaint: SOB, cough, body aches  Current Therapy: Ponatinib + Blinatumomab        Assessment/Plan:  SOB, cough, body aches  Patient has been confirmed to be infected with SARS-COVID 19. He is a current tobacco user, smoking 1.5ppd with no interest in quitting at this time. The etiology of his symptoms are certainly multifactorial given active viral infection, tobacco use and chemotherapy side effects.   --Labs: CBC w/ Differential, CMP (SCr 1.86-stable, NA 134), Magnesium, Phosphorus, C-RP (14), Rapid Influenza/RSV/ Covid PCR   --Stable oxygen saturation 96% at room air  --Chest X-ray: No pleural effusion, pneumothorax, or focal consolidation.   --Start Augmentin 875-125 twice daily x 7 days for prophylaxis COVID pneumonia  --Patient is not a candidate for COVID-19 therapeutics given the uncertainty in the date of symptom onset as he reports couple of weeks. He is outside the 5-7 days window to start any treatment.    --Will defer Cycle 3 blinatumomab treatment today   --Recommend smoking cessation. Patient provided with 1-800 QUIT now resource when he feels he is ready to take this step. Advised he could notify primary healthcare team for additional assistance when he is ready.   --Symptom management: menthol lozenges, warm fluids are okay.     Dyspepsia  Patient with ongoing issue with symptoms of indigestion. Has tried OTC Tums with little relief. Symptoms mostly present in the evening.   --Start Famotidine 20 mg daily   --Discussed avoiding spicy, greasy and fatty foods. Recommend he avoid carbonated beverages to avoid worsening symptoms. Recommend he avoid eating at least 2 hours prior to bedtime and try eating 5-6 smaller, frequent meals.     Fatigue  Patient with fatigue and low energy. Etiology is multifactorial given he is battling active viral infection, currently smoking and is on active chemotherapy. I recommend at this time we focus on rest with physical activity as tolerated. He has been provided with resources if he is interested in smoking cessation. He has been advised to notify his primary oncology team if he feels his symptoms are worsening as this will require re-evaluation.        I have discussed the case (exam, laboratory results, and plan) with Arlee Muslim, NP. Patient and team are all in agreement with plan outlined above.      Disposition: Adam Keith did not receive chemotherapy in clinic today given how poorly he is feeling and new SARS COVID-19 diagnosis. He remains stable and is cleared for discharge to home with instructions to notify his primary oncology team if he has worsening of his symptoms. He will be contacted by the Leukemia team to schedule appointment with [name], NP for wellness check next week. He and his spouse verbalize understanding of this information.         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 The course was complicated by septic shock, candida krusei fungemia, MRSA bacteremia with septic emboli c/b acute renal failure and respiratory distress requiring dialysis and intubation.  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  He subsequently had difficulty tolerating single agent dasatinib (as a bridge to planned ponatinib-blinatumomab--had fluid retention and pleural effusion).  He presents to the infusion clinic for Cycle 3 Blinatumomab 7  day home infusion, but is being evaluated for c/o SOB, cough and body aches.     Patient with chronic dry cough and shortness of breath for months, but in the last couple of weeks his cough has become productive with yellow phlegm. He is current everyday smoker, currently at 1.5ppd. He denies chest pain or hemoptysis. He denies any sick contacts, fevers or chills. He has oxygen monitor and supplemental oxygen at home, but does not use either. He remains independent with ADL's.     Patient with body aches in the last couple of weeks. Exact date of onset unable to be determined. Again, he denies fevers, chills or sick contacts. He is also c/o indigestion and has tried OTC TUMS without relief. He has been on Famotidine in the past for this when he was on therapy with Dasatinib and doesn't remember if it helped or not, but spouse seems to think it did. He has been fatigued and overall not feeling well lately, but reluctant to notify his healthcare providers in fear of being admitted to the hospital.         Oncology History:  Oncology History Overview Note   Referring/Local Oncologist: None    Diagnosis:Ph+ ALL    Genetics:    Karyotype/FISH:Abnormal Karyotype: 46,XY,t(9;22)(q34;q11.2)[1]/45,XY,der(7;9)(q10;q10)t(9;22)(q34;q11.2),der(22)t(9;22)[11]/46,sdl,+der(22)t(9;22)[5]/46,XY[3]     Abnormal FISH: A BCR/ABL1 interphase FISH assay shows an abnormal signal pattern in 97% of the 100 cells scored. 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  The findings support a diagnosis of leukemia and have implications for targeted therapy and for monitoring residual disease.      Molecular Genetics:  BCR-ABL1 p210 transcripts were detected at a level of 46.479 IS% ratio in bone marrow.  BCR-ABL1 p190 transcripts were detected at a level of 4 in 100,000 cells in bone marrow.    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)     01/24/2020 - 04/02/2020 Chemotherapy    IP/OP LEUKEMIA GRAAPH-2005 + RITUXIMAB < 60 YO  rituximab hypercvad (odd and even course)     02/24/2020 Remission    CR with PCR MRD+; marrow showing 70% blasts with <1% blasts, negative flow MRD, BCR-ABL p210 PCR 0.197%; CNS negative for blasts     03/09/2020 Progression    CSF - rare blast ID'ed - IT chemo given    03/13/20 - IT chemo, CSF negative     04/16/2020 Biopsy    BM Bx with 40-50% cellularity, <1% blasts, MRD flow cytometry negative, BCR-ABL p210 transcripts 0.036%     05/28/2020 - 05/28/2020 Chemotherapy    OP AML - CNS THERAPY (INTRATHECAL CYTARABINE, INTRATHECAL METHOTREXATE, OR INTRATHECAL TRIPLE)  Select one of the following: cytarabine IT 100 mg with hydrocortisone 50 mg, cytarabine IT 40 mg with methotrexate 15 mg with hydrocortisone 50 mg, OR methotrexate IT 12 mg with hydrocortisone 50 mg     06/19/2020 -  Chemotherapy    IP/OP LEUKEMIA BLINATUMOMAB 7-DAY INFUSION (MINIMAL RESIDUAL DISEASE; WT >= 22 KG) (HOME INFUSION)      Cycles 1*-4: Blinatumomab 28 mcg/day Days 1-28 of 6-week cycle.  *Given in the inpatient setting on Days 1-3 on Cycle 1 and Days 1-2 on Cycle  2, while other treatment days are given in the outpatient setting.    Cycle 1 MRD Dosing     07/18/2020 Adverse Reaction    Hospitalization: fevers. Pneumonia     07/23/2020 Biopsy    Diagnosis  Bone marrow, right iliac, aspiration and biopsy  -   Normocellular bone marrow (50%) with trilineage hematopoiesis and 1% blasts by manual aspirate differential  -   Flow cytometry MRD analysis reveals no definitive immunophenotypic evidence of residual B lymphoblastic leukemia   - BCR-ABL p210 0.006%         08/10/2020 -  Chemotherapy    Cycle 2 blinatumomab-ponatinib  Ponatinib 30mg     1 IT per cycle     ALL (acute lymphoblastic leukemia) (CMS-HCC)   06/11/2020 -  Chemotherapy    IP/OP LEUKEMIA BLINATUMOMAB 7-DAY INFUSION (MINIMAL RESIDUAL DISEASE; WT >= 22 KG) (HOME INFUSION)  Cycles 1*-4: Blinatumomab 28 mcg/day Days 1-28 of 6-week cycle.  *Given in the inpatient setting on Days 1-3 on Cycle 1 and Days 1-2 on Cycle 2, while other treatment days are given in the outpatient setting.     09/21/2020 Initial Diagnosis    ALL (acute lymphoblastic leukemia) (CMS-HCC)         Allergies:  Allergies   Allergen Reactions   ??? Bupropion Hcl Other (See Comments)     Per patient out of touch with reality, suicidal, homicidal   ??? 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 Onion Anaphylaxis   ??? Vancomycin Analogues      Hearing loss with Lasix  Other reaction(s): Other (See Comments)  Hearing loss  lasix   ??? Bismuth Subsalicylate Nausea And Vomiting   ??? Ceftaroline Fosamil Rash and Other (See Comments)     Rash X 2 and pancytopenia 02/2020   ??? Furosemide      With Vancomycin caused hearing loss  Other reaction(s): Other (See Comments)  With Vancomycin caused hearing loss       Medications:  Prior to Admission medications    Medication Dose, Route, Frequency   acetaminophen (TYLENOL) 325 MG tablet 650 mg, Oral, Every 6 hours PRN   amLODIPine (NORVASC) 10 MG tablet 10 mg, Oral, Daily (standard)   amoxicillin-clavulanate (AUGMENTIN) 875-125 mg per tablet 1 tablet, Oral, 2 times a day (standard)   aspirin 81 MG chewable tablet Chew 1 tablet (81 mg total) daily.   carvediloL (COREG) 12.5 MG tablet TAKE 1 TABLET BY MOUTH TWO TIMES A DAY.   clonazePAM (KLONOPIN) 0.5 MG tablet 0.5 mg, Oral, 2 times a day PRN   gabapentin (NEURONTIN) 300 MG capsule 300 mg, Oral, 3 times a day (standard)   HYDROmorphone (DILAUDID) 2 MG tablet 2 mg, Oral, Every 6 hours PRN   HYDROmorphone (DILAUDID) 2 MG tablet 2 mg, Oral, Every 6 hours PRN   hydrOXYzine (ATARAX) 25 MG tablet 25 mg, Oral, Every 6 hours PRN   isavuconazonium sulfate (CRESEMBA) 186 mg cap capsule Take 2 capsules (372 mg total) by mouth daily.   nortriptyline (PAMELOR) 50 MG capsule 50 mg, Oral, Nightly   oxyCODONE (OXYCONTIN) 15 mg 12 hr crush resistant ER/CR tablet 15 mg, Oral, 2 times a day   oxyCODONE (OXYCONTIN) 15 mg 12 hr crush resistant ER/CR tablet 15 mg, Oral, 2 times a day   PONATinib (ICLUSIG) 30 mg tablet Take 1 tablet (30 mg total) by mouth daily. Swallow tablets whole. Do not crush, break, cut or chew tablets.  prochlorperazine (COMPAZINE) 10 MG tablet 10 mg, Oral, Every 6 hours PRN   sulfamethoxazole-trimethoprim (BACTRIM DS) 800-160 mg per tablet 1 tablet, Oral, 2 times a day on saturday, sunday, For prophylaxis while on chemo.   valACYclovir (VALTREX) 500 MG tablet Take 1 tablet (500 mg total) by mouth daily.       Review of Systems:  Review of Systems   Constitutional: Positive for fatigue. Negative for appetite change, chills, diaphoresis, fever and unexpected weight change.   HENT:   Positive for hearing loss and sore throat. Negative for mouth sores, nosebleeds, trouble swallowing and voice change.         Right ear hearing loss.   Respiratory: Positive for cough, shortness of breath and wheezing. Negative for hemoptysis.         Acute worsening of chronic cough. Productive with yellow phlegm. Acute worsening of shortness of breath. Wheezes are ongoing.    Cardiovascular: Negative for chest pain, leg swelling and palpitations.   Gastrointestinal: Negative for abdominal distention, abdominal pain, blood in stool, constipation, diarrhea, nausea and vomiting.   Genitourinary: Negative for difficulty urinating, dysuria, frequency and hematuria.    Musculoskeletal: Positive for back pain.        Chronic back pain.   Skin: Positive for itching. Negative for rash and wound.        Diagnosed with DRESS and follows with ICID. Rash has resolved. Remains with generalized itching; takes prednisone.    Neurological: Positive for dizziness. Negative for extremity weakness, headaches and light-headedness.        Ongoing intermittent episodes of dizziness. Not present at this time.    Hematological: Negative for adenopathy. Does not bruise/bleed easily.   All other systems reviewed and are negative.      Physical Exam:  Vital Signs:    BP 176/98   HR 77  RR 18   Temp 36.5 (97.7)  O2 saturation 96% RA Wt 112.9kg     Physical Exam  Vitals and nursing note reviewed.   Constitutional:       General: He is not in acute distress.     Appearance: He is ill-appearing. He is not toxic-appearing or diaphoretic.   HENT:      Head: Normocephalic and atraumatic.      Nose: Nose normal.      Mouth/Throat:      Mouth: Mucous membranes are moist.      Pharynx: Oropharynx is clear. No oropharyngeal exudate or posterior oropharyngeal erythema.   Eyes:      Extraocular Movements: Extraocular movements intact.      Conjunctiva/sclera: Conjunctivae normal.      Pupils: Pupils are equal, round, and reactive to light.   Cardiovascular:      Rate and Rhythm: Normal rate and regular rhythm.      Pulses: Normal pulses.      Heart sounds: Normal heart sounds. No murmur heard.    No friction rub. No gallop.   Pulmonary:      Effort: Pulmonary effort is normal. No respiratory distress.      Breath sounds: No stridor. Wheezing present. No rhonchi or rales.      Comments: Diminished breath sounds throughout all lung fields with quiet expiratory wheeze heard in anterior bases bilaterally.   Chest:      Chest wall: No tenderness.   Abdominal:      General: Abdomen is flat. Bowel sounds are normal. There is no distension.      Palpations: Abdomen is  soft.      Tenderness: There is no abdominal tenderness. There is no guarding.   Musculoskeletal:      Right lower leg: No edema.      Left lower leg: No edema.   Skin:     General: Skin is warm.      Findings: No bruising or rash.   Neurological:      General: No focal deficit present.      Mental Status: He is alert and oriented to person, place, and time.           Results:  Hospital Outpatient Visit on 09/21/2020   Component Date Value Ref Range Status   ??? SARS-CoV-2 PCR 09/21/2020 Positive (A) Negative Final   ??? Influenza A 09/21/2020 Negative  Negative Final   ??? Influenza B 09/21/2020 Negative  Negative Final   ??? RSV 09/21/2020 Negative  Negative Final   ??? SARS-CoV-2 CT Value 09/21/2020 37.0   Final    SARS-CoV-2 cycle numbers (Ct values) should not be used to guide clinical care without infectious disease consultation. Cycle numbers are provided solely for use by infectious diseases. Variation between testing platforms, laboratories, and sampling techniques limits comparison across tests. Validated clinical algorithms using cycle numbers are not currently available.   Lab on 09/21/2020   Component Date Value Ref Range Status   ??? Sodium 09/21/2020 134 (A) 135 - 145 mmol/L Final   ??? Potassium 09/21/2020 3.9  3.4 - 4.8 mmol/L Final   ??? Chloride 09/21/2020 107  98 - 107 mmol/L Final   ??? CO2 09/21/2020 24.0  20.0 - 31.0 mmol/L Final   ??? Anion Gap 09/21/2020 3 (A) 5 - 14 mmol/L Final   ??? BUN 09/21/2020 21  9 - 23 mg/dL Final   ??? Creatinine 09/21/2020 1.86 (A) 0.60 - 1.10 mg/dL Final   ??? BUN/Creatinine Ratio 09/21/2020 11   Final   ??? eGFR CKD-EPI (2021) Male 09/21/2020 46 (A) >=60 mL/min/1.23m2 Final    eGFR calculated with CKD-EPI 2021 equation in accordance with [employer] and [employer] of Nephrology Task Force recommendations.   ??? Glucose 09/21/2020 91  70 - 179 mg/dL Final   ??? Calcium 16/01/9603 9.5  8.7 - 10.4 mg/dL Final   ??? Albumin 54/12/8117 3.9  3.4 - 5.0 g/dL Final   ??? Total Protein 09/21/2020 7.0  5.7 - 8.2 g/dL Final   ??? Total Bilirubin 09/21/2020 0.3  0.3 - 1.2 mg/dL Final   ??? AST 14/78/2956 21  <=34 U/L Final   ??? ALT 09/21/2020 23  10 - 49 U/L Final   ??? Alkaline Phosphatase 09/21/2020 75  46 - 116 U/L Final   ??? Magnesium 09/21/2020 1.8  1.6 - 2.6 mg/dL Final   ??? Phosphorus 09/21/2020 4.0  2.4 - 5.1 mg/dL Final   ??? CRP 21/30/8657 14.0 (A) <=10.0 mg/L Final   ??? WBC 09/21/2020 11.6 (A) 3.6 - 11.2 10*9/L Final   ??? RBC 09/21/2020 3.91 (A) 4.26 - 5.60 10*12/L Final   ??? HGB 09/21/2020 13.0  12.9 - 16.5 g/dL Final   ??? HCT 84/69/6295 37.2 (A) 39.0 - 48.0 % Final   ??? MCV 09/21/2020 95.1  77.6 - 95.7 fL Final   ??? MCH 09/21/2020 33.3 (A) 25.9 - 32.4 pg Final   ??? MCHC 09/21/2020 35.0  32.0 - 36.0 g/dL Final   ??? RDW 28/41/3244 17.3 (A) 12.2 - 15.2 % Final   ??? MPV 09/21/2020 7.8  6.8 - 10.7 fL Final   ???  Platelet 09/21/2020 213  150 - 450 10*9/L Final   ??? Neutrophils % 09/21/2020 74.7  % Final   ??? Lymphocytes % 09/21/2020 18.7  % Final   ??? Monocytes % 09/21/2020 1.3  % Final   ??? Eosinophils % 09/21/2020 4.2  % Final   ??? Basophils % 09/21/2020 1.1  % Final   ??? Absolute Neutrophils 09/21/2020 8.6 (A) 1.8 - 7.8 10*9/L Final   ??? Absolute Lymphocytes 09/21/2020 2.2  1.1 - 3.6 10*9/L Final   ??? Absolute Monocytes 09/21/2020 0.2 (A) 0.3 - 0.8 10*9/L Final   ??? Absolute Eosinophils 09/21/2020 0.5  0.0 - 0.5 10*9/L Final   ??? Absolute Basophils 09/21/2020 0.1  0.0 - 0.1 10*9/L Final   ??? Anisocytosis 09/21/2020 Slight (A) Not Present Final           I personally spent 35 minutes face-to-face and non-face-to-face in the care of this patient, which includes all pre, intra, and post visit time on the date of service.         Ronney Lion, ANP   PAGER [phone]  OFFICE [phone]

## 2020-09-23 NOTE — Unmapped (Signed)
Encounter addended by: Ronney Lion, ANP on: 09/23/2020 9:24 AM   Actions taken: Clinical Note Signed, Charge Capture section accepted

## 2020-09-28 ENCOUNTER — Telehealth: Admit: 2020-09-28 | Discharge: 2020-09-29 | Payer: MEDICAID | Attending: Adult Health | Primary: Adult Health

## 2020-09-28 ENCOUNTER — Ambulatory Visit: Admit: 2020-09-28 | Discharge: 2020-09-29 | Payer: MEDICAID

## 2020-09-28 MED ORDER — GABAPENTIN 300 MG CAPSULE
ORAL_CAPSULE | Freq: Three times a day (TID) | ORAL | 6 refills | 90 days | Status: CP
Start: 2020-09-28 — End: ?

## 2020-09-28 MED ORDER — VALACYCLOVIR 500 MG TABLET
ORAL_TABLET | Freq: Every day | ORAL | 11 refills | 90 days | Status: CP
Start: 2020-09-28 — End: ?

## 2020-09-28 NOTE — Unmapped (Signed)
The patient reports they are currently: at home. I spent 15 minutes on the real-time audio and video with the patient on the date of service. I spent an additional 20 minutes on pre- and post-visit activities on the date of service.     The patient was physically located in [place] or a state in which I am permitted to provide care. The patient and/or parent/guardian understood that s/he may incur co-pays and cost sharing, and agreed to the telemedicine visit. The visit was reasonable and appropriate under the circumstances given the patient's presentation at the time.    The patient and/or parent/guardian has been advised of the potential risks and limitations of this mode of treatment (including, but not limited to, the absence of in-person examination) and has agreed to be treated using telemedicine. The patient's/patient's family's questions regarding telemedicine have been answered.     If the visit was completed in an ambulatory setting, the patient and/or parent/guardian has also been advised to contact their provider???s office for worsening conditions, and seek emergency medical treatment and/or call 911 if the patient deems either necessary.         [facility]    Patient Name: Adam Keith  Patient Age: 40 y.o.  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 The course was complicated by septic shock, candida krusei fungemia, MRSA bacteremia with septic emboli c/b acute renal failure and respiratory distress requiring dialysis and intubation.  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  He subsequently had difficulty tolerating single agent dasatinib (as a bridge to planned ponatinib-blinatumomab--had fluid retention and pleural effusion).        He is now s/p cycle 2 of ponatinib-blinatumomab. BCR-ABL after Cycle 1 was 0.006% in the marrow.     Cycle 3 delayed 1 week (as of today) due to COVID-19 infection. His symptoms have recovered and he is completing a course of Augmentin today (started out of concern for development of a bacterial superinfection on Covid). Has continued the ponatinib. As long as he remains symptom free, clinically stable, will start Cycle 3 blinatumomab next week. Blinatumomab does not have a high level of immunosuppression and recent Covid strains have not tended to have the same level of mortality as previous.    The plan remains to complete 5 cycles.  We will check BCR-ABL level (peripheral blood) each cycle.  If the BCR-ABL level becomes undetectable we will dose reduce the ponatinib to 15 mg.    IVIG will be continued - once every 6 weeks.  Continues to be followed by [facility]. Isavuconazole trough level collected today and they have planned a repeat Chest CT in July.  Rash has not recurred, but he does continue to itch. He continues the steroids for DRESS and is followed by Dermatology      Plan and Recommendations:  Ph+ B-ALL: CSF with rare blasts 12/6, now in CR (low level BCR-ABL) and no blasts in CSF  -PLAN to START Cycle 3 blinatumomab-ponatinib on 7/4 as outpatient  - Ponatinib 30mg  daily until BCR-ABL level is undetectable, then decrease to 15mg    -  Continue aspirin while on ponatinib   - Plan a total of 5 cycles and then transition to ponatinib maintenance   - next bone marrow biopsy planned for the end of cycle 5   - IT triple therapy 1x per cycle  -could consider intensification of therapy at later date, depending on continued PS and renal function improvements    DRESS: followed by Dr. Caryn Section, dermatolgy  - rash currently resolved but itching persists  - continue prednisone Dyspnea, PND, edema, pleural effusion:  Likely due to dasatinib.  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 Now s/p dialysis  - stable Cr appears to have developed CKD due to incompletely healed acute injury.  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 [name]  **hx Pneumonia - MRSA likely, fungal tests negative  - Chest CT in July, per Dr. [name]   - Covid-19   - see assessment notes above    Symptom management: bowel regimen as listed in medication list, analgesics as listed in medication list or symptoms reviewed and due to acceptable control, no changes made  **Peripheral neuropathy, sensory and motor  - Grade 1-2 monitor  **Chronic Back Pain, complicated by hospital stay and procedures  - Followed by Dr. Genice Rouge, Palliative Care  **Depression/anxiety: met with [name], [facility]  - nortriptyline  - PRN clonazepam  - has not been using PRN hydroxyzine      Venous access: port-a-cath in place     Supportive Care Recommendations:  We recommend based on the patient???s underlying diagnosis and treatment history the following supportive care:      1. Antimicrobial prophylaxis:    - Viral - valacyclovir 500mg  daily  - Bacterial - on treatment linezolid currently  - Fungal - on treatment isavuconazole  - PJP - Bactrim DS BID Sat/Sun  -Evusheld 04/21/2020-recommend another dose to achieve FDA recommendation update.    2. Blood product support:  I recommend the following intervals for laboratory monitoring to determine transfusion needs and monitor for hematologic effects of therapy and underlying disease: no routine monitoring outside of clinic follow-up    Leukoreduced blood products are required.  Irradiated blood products are preferred, but in case of urgent transfusion needs non-irradiated blood products may be used:     -  RBC transfusion threshold: transfuse 2 units for Hgb < 8 g/dL.  -  Platelet transfusion threshold: transfuse 1 unit of platelets for platelet count < 10, or for bleeding or need for invasive procedure.    3. Hematopoietic growth factor support: none      Care coordination: The next [facility] Follow up requested:  - see notes above  - IT is scheduled    These services include the following elements of medical decision making:  addressing a hematologic cancer that poses a threat to life and/or bone marrow function  interpretation of diagnostic test reports or ordering of diagnostic tests and independent interpretation of diagnostic tests  High risk of morbidity due to drug therapy requiring monitoring for toxicity or decisions regarding goals and continuation of antineoplastic therapy    [name], AGPCNP-BC  Nurse Practitioner  Hematology/Oncology  [facility]      Troy Sine. [name], MD  Leukemia Program  Division of Hematology/Oncology  [facility]    Nurse Navigator (non-clinical trial patients): Colletta Maryland, RN        Tel. [phone]       Fax. [phone]  Toll-free appointments: [phone]  Scheduling assistance: [phone]  After hours/weekends: [phone] (ask for adult hematology/oncology on-call)        History of Present Illness:  We had the pleasure of seeing Adam Keith in the Leukemia Clinic at the [facility] [place] on 09/28/2020.  He is a 40 y.o. male with Ph+ B-ALL.          His oncologic history is as follows:      Oncology History Overview Note   Referring/Local Oncologist: None    Diagnosis:Ph+ ALL    Genetics:    Karyotype/FISH:Abnormal Karyotype: 46,XY,t(9;22)(q34;q11.2)[1]/45,XY,der(7;9)(q10;q10)t(9;22)(q34;q11.2),der(22)t(9;22)[11]/46,sdl,+der(22)t(9;22)[5]/46,XY[3]     Abnormal FISH: A BCR/ABL1 interphase FISH assay shows an abnormal signal pattern in 97% of the 100 cells scored. 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  The findings support a diagnosis of leukemia and have implications for targeted therapy and for monitoring residual disease.      Molecular Genetics:  BCR-ABL1 p210 transcripts were detected at a level of 46.479 IS% ratio in bone marrow.  BCR-ABL1 p190 transcripts were detected at a level of 4 in 100,000 cells in bone marrow.    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)     01/24/2020 - 04/02/2020 Chemotherapy    IP/OP LEUKEMIA GRAAPH-2005 + RITUXIMAB < 60 YO  rituximab hypercvad (odd and even course)     02/24/2020 Remission    CR with PCR MRD+; marrow showing 70% blasts with <1% blasts, negative flow MRD, BCR-ABL p210 PCR 0.197%; CNS negative for blasts     03/09/2020 Progression    CSF - rare blast ID'ed - IT chemo given    03/13/20 - IT chemo, CSF negative     04/16/2020 Biopsy    BM Bx with 40-50% cellularity, <1% blasts, MRD flow cytometry negative, BCR-ABL p210 transcripts 0.036%     05/28/2020 - 05/28/2020 Chemotherapy    OP AML - CNS THERAPY (INTRATHECAL CYTARABINE, INTRATHECAL METHOTREXATE, OR INTRATHECAL TRIPLE)  Select one of the following: cytarabine IT 100 mg with hydrocortisone 50 mg, cytarabine IT 40 mg with methotrexate 15 mg with hydrocortisone 50 mg, OR methotrexate IT 12 mg with hydrocortisone 50 mg     06/19/2020 -  Chemotherapy    IP/OP LEUKEMIA BLINATUMOMAB 7-DAY INFUSION (MINIMAL RESIDUAL DISEASE; WT >= 22 KG) (HOME INFUSION)      Cycles 1*-4: Blinatumomab 28 mcg/day Days 1-28 of 6-week cycle.  *Given in the inpatient setting on Days 1-3 on Cycle 1 and Days 1-2 on Cycle 2, while other treatment days are given in the outpatient setting.    Cycle 1 MRD Dosing     07/18/2020 Adverse Reaction    Hospitalization: fevers. Pneumonia     07/23/2020 Biopsy    Diagnosis  Bone marrow, right iliac, aspiration and biopsy  -   Normocellular bone marrow (50%) with trilineage hematopoiesis and 1% blasts by manual aspirate differential  -   Flow cytometry MRD analysis reveals  no definitive immunophenotypic evidence of residual B lymphoblastic leukemia   - BCR-ABL p210 0.006%         08/10/2020 -  Chemotherapy    Cycle 2 blinatumomab-ponatinib  Ponatinib 30mg     1 IT per cycle     ALL (acute lymphoblastic leukemia) (CMS-HCC)   06/11/2020 -  Chemotherapy    IP/OP LEUKEMIA BLINATUMOMAB 7-DAY INFUSION (MINIMAL RESIDUAL DISEASE; WT >= 22 KG) (HOME INFUSION)  Cycles 1*-4: Blinatumomab 28 mcg/day Days 1-28 of 6-week cycle.  *Given in the inpatient setting on Days 1-3 on Cycle 1 and Days 1-2 on Cycle 2, while other treatment days are given in the outpatient setting.     09/21/2020 Initial Diagnosis    ALL (acute lymphoblastic leukemia) (CMS-HCC)         Interim History:  Since his last visit was due to start Cycle 3 blina but was developed URI symptoms and was diagnosed with COVID (test > 5 days from symptom onset so too late for Covid therapeutics).  Marta Lamas was held and he was treated with Augmentin out of concern he may develop a secondary bacterial pneumonia.    Today patient reports his URI symptoms have completely resolved. He is not having any fevers. His only complaints are some GI upset in the past couple of days. He thinks this is due to the Augmentin.Two nights ago he vomited twice. Last 2 days, some loose stools, but some formed as well.    He feels he would be ready to start blina next week.    He still has some itching and is on 1mg  of blina.        Past Medical, Surgical and Family History were reviewed and pertinent updates were made in the Electronic Medical Record      ECOG Performance Status: 1    Medications:    Current Outpatient Medications   Medication Sig Dispense Refill   ??? acetaminophen (TYLENOL) 325 MG tablet Take 650 mg by mouth every six (6) hours as needed for pain.      ??? amLODIPine (NORVASC) 10 MG tablet Take 1 tablet (10 mg total) by mouth daily. 30 tablet 0   ??? amoxicillin-clavulanate (AUGMENTIN) 875-125 mg per tablet Take 1 tablet by mouth Two (2) times a day for 7 days. 14 tablet 0   ??? aspirin 81 MG chewable tablet Chew 1 tablet (81 mg total) daily. 36 tablet 11   ??? carvediloL (COREG) 12.5 MG tablet TAKE 1 TABLET BY MOUTH TWO TIMES A DAY. 180 tablet 1   ??? clonazePAM (KLONOPIN) 0.5 MG tablet Take 1 tablet (0.5 mg total) by mouth two (2) times a day as needed for anxiety or sleep. 60 tablet 0   ??? gabapentin (NEURONTIN) 300 MG capsule Take 300 mg by mouth Three (3) times a day.     ??? HYDROmorphone (DILAUDID) 2 MG tablet Take 1 tablet (2 mg total) by mouth every six (6) hours as needed for pain,severe (7-10) for up to 28 days. 30 tablet 0   ??? HYDROmorphone (DILAUDID) 2 MG tablet Take 1 tablet (2 mg total) by mouth every six (6) hours as needed for pain,severe (7-10) for up to 28 days. 30 tablet 0   ??? hydrOXYzine (ATARAX) 25 MG tablet Take 1 tablet (25 mg total) by mouth every six (6) hours as needed. 60 tablet 1   ??? isavuconazonium sulfate (CRESEMBA) 186 mg cap capsule Take 2 capsules (372 mg total) by mouth daily. 56 capsule 2   ???  nortriptyline (PAMELOR) 50 MG capsule Take 1 capsule (50 mg total) by mouth nightly. 30 capsule 11   ??? oxyCODONE (OXYCONTIN) 15 mg 12 hr crush resistant ER/CR tablet Take 1 tablet (15 mg total) by mouth two (2) times a day for 28 days. 56 tablet 0   ??? oxyCODONE (OXYCONTIN) 15 mg 12 hr crush resistant ER/CR tablet Take 1 tablet (15 mg total) by mouth two (2) times a day for 28 days. 56 tablet 0   ??? PONATinib (ICLUSIG) 30 mg tablet Take 1 tablet (30 mg total) by mouth daily. Swallow tablets whole. Do not crush, break, cut or chew tablets. 30 tablet 5   ??? prochlorperazine (COMPAZINE) 10 MG tablet Take 1 tablet (10 mg total) by mouth every six (6) hours as needed for nausea (if no relief from zofran (ondansetron)). 60 tablet 1   ??? sulfamethoxazole-trimethoprim (BACTRIM DS) 800-160 mg per tablet Take 1 tablet (160 mg of trimethoprim total) by mouth 2 times a day on Saturday, Sunday. For prophylaxis while on chemo. 48 tablet 3   ??? valACYclovir (VALTREX) 500 MG tablet Take 1 tablet (500 mg total) by mouth daily. 90 tablet 3     No current facility-administered medications for this visit.       Vital Signs:  There were no vitals filed for this visit.      Relevant Physical Exam Findings:  Well appearing, ON VIDEO      Relevant Laboratory, radiology and pathology results:  I personally viewed the most recent internal records, laboratory results, hematopathology report and molecular pathology reports  and discussed the available results with the patient and family  A summary of results follows:  No visits with results within 1 Week(s) from this visit.   Latest known visit with results is:   Hospital Outpatient Visit on 09/21/2020   Component Date Value Ref Range Status   ??? SARS-CoV-2 PCR 09/21/2020 Positive (A) Negative Final   ??? Influenza A 09/21/2020 Negative  Negative Final   ??? Influenza B 09/21/2020 Negative  Negative Final   ??? RSV 09/21/2020 Negative  Negative Final   ??? SARS-CoV-2 CT Value 09/21/2020 37.0   Final    SARS-CoV-2 cycle numbers (Ct values) should not be used to guide clinical care without infectious disease consultation. Cycle numbers are provided solely for use by infectious diseases. Variation between testing platforms, laboratories, and sampling techniques limits comparison across tests. Validated clinical algorithms using cycle numbers are not currently available.     Chest X-ray PA and lateral ??Increased interstitial edema versus atypical infection/inflammation.  ??  Small right pleural effusion.    Diagnosis   Date Value Ref Range Status   09/18/2020   Final    A:  Cerebrospinal fluid, cytospin review    -  Bloody specimen with no blasts identified    This electronic signature is attestation that the pathologist personally reviewed the submitted material(s) and the final diagnosis reflects that evaluation.       Diagnosis   Date Value Ref Range Status   09/18/2020   Final    A:  Cerebrospinal fluid, cytospin review    -  Bloody specimen with no blasts identified    This electronic signature is attestation that the pathologist personally reviewed the submitted material(s) and the final diagnosis reflects that evaluation.     05 /09/2020   Final    A:  Cerebrospinal fluid, cytospin review and flow cytometry   -  No blasts identified    This electronic  signature is attestation that the pathologist personally reviewed the submitted material(s) and the final diagnosis reflects that evaluation.     07/23/2020   Final    Bone marrow, right iliac, aspiration and biopsy  -   Normocellular bone marrow (50%) with trilineage hematopoiesis and 1% blasts by manual aspirate differential  -   Flow cytometry MRD analysis reveals no definitive immunophenotypic evidence of residual B lymphoblastic leukemia   -  See linked reports for associated Ancillary Studies.      This electronic signature is attestation that the pathologist personally reviewed the submitted material(s) and the final diagnosis reflects that evaluation.

## 2020-09-29 NOTE — Unmapped (Signed)
As requested, I have contacted the patient to review/clarify the following:    ?? Given another COVID-19 diagnosis on 09/21/2020, the patient was unable to start cycle 3 of blinatumomab as expected on 09/21/2020.    ?? Therefore, arrangements have been made to start cycle 3 of blinatumomab on Tuesday, October 06, 2020 using the COVID-positive workflow in the infusion center with diet changes to be done once every 7 days.    ?? The next dose of IVIG is anticipated on 10/30/2020 which is exactly 6 weeks from the prior dose administered on 09/18/2020.  ?? If approved by [name], this therapy may be administered next on 10/27/2020 in commensurate with blinatumomab bag changes.    ?? Clinic follow-up with the Leukemia Care Team is anticipated next around 10/21/2020.    Otherwise, the patient expressed verbal understanding and had no other questions or concerns at this time.  He is in agreement with the plan of care outlined above and will contact clinic back sooner should he have any other issues.    No other actions taken and this updates been shared with the Leukemia Care Team.    No other actions taken.

## 2020-09-29 NOTE — Unmapped (Signed)
Cancelled 7/29

## 2020-10-02 NOTE — Unmapped (Signed)
[facility] Refill Coordination Note    Specialty Medication(s) to be Shipped:   Hematology/Oncology: Iclusig    Other medication(s) to be shipped: No additional medications requested for fill at this time     Selassie Spatafore, DOB: 19-Jul-1980  Phone: [phone] (home)       All above HIPAA information was verified with patient.     Was a Nurse, learning disability used for this call? No    Completed refill call assessment today to schedule patient's medication shipment from the [facility] [phone]).  All relevant notes have been reviewed.     Specialty medication(s) and dose(s) confirmed: Regimen is correct and unchanged.   Changes to medications: Alben reports no changes at this time.  Changes to insurance: No  New side effects reported not previously addressed with a pharmacist or physician: None reported  Questions for the pharmacist: No    Confirmed patient received a Conservation officer, historic buildings and a Surveyor, mining with first shipment. The patient will receive a drug information handout for each medication shipped and additional FDA Medication Guides as required.       DISEASE/MEDICATION-SPECIFIC INFORMATION        N/A    SPECIALTY MEDICATION ADHERENCE     Medication Adherence    Patient reported X missed doses in the last month: 0  Specialty Medication: Iclusig 30 mg  Patient is on additional specialty medications: No  Informant: patient              Were doses missed due to medication being on hold? No    Iclusig 30 mg: 12 days of medicine on hand       REFERRAL TO PHARMACIST     Referral to the pharmacist: Not needed      [facility]     Shipping address confirmed in Epic.     Delivery Scheduled: Yes, Expected medication delivery date: 10/09/20.     Medication will be delivered via UPS to the prescription address in Epic [place].    [name]   [facility] Specialty Technician

## 2020-10-03 MED ORDER — SULFAMETHOXAZOLE 800 MG-TRIMETHOPRIM 160 MG TABLET
ORAL_TABLET | Freq: Two times a day (BID) | ORAL | 3 refills | 84 days | Status: CP
Start: 2020-10-03 — End: ?

## 2020-10-06 ENCOUNTER — Ambulatory Visit: Admit: 2020-10-06 | Discharge: 2020-10-07 | Payer: MEDICAID

## 2020-10-06 DIAGNOSIS — R0600 Dyspnea, unspecified: Principal | ICD-10-CM

## 2020-10-06 DIAGNOSIS — B49 Unspecified mycosis: Principal | ICD-10-CM

## 2020-10-06 LAB — CBC W/ AUTO DIFF
BASOPHILS ABSOLUTE COUNT: 0.1 10*9/L (ref 0.0–0.1)
BASOPHILS RELATIVE PERCENT: 1.2 %
EOSINOPHILS ABSOLUTE COUNT: 0.2 10*9/L (ref 0.0–0.5)
EOSINOPHILS RELATIVE PERCENT: 2.8 %
HEMATOCRIT: 37.5 % — ABNORMAL LOW (ref 39.0–48.0)
HEMOGLOBIN: 13 g/dL (ref 12.9–16.5)
LYMPHOCYTES ABSOLUTE COUNT: 2.3 10*9/L (ref 1.1–3.6)
LYMPHOCYTES RELATIVE PERCENT: 26.4 %
MEAN CORPUSCULAR HEMOGLOBIN CONC: 34.7 g/dL (ref 32.0–36.0)
MEAN CORPUSCULAR HEMOGLOBIN: 32.8 pg — ABNORMAL HIGH (ref 25.9–32.4)
MEAN CORPUSCULAR VOLUME: 94.3 fL (ref 77.6–95.7)
MEAN PLATELET VOLUME: 7.8 fL (ref 6.8–10.7)
MONOCYTES ABSOLUTE COUNT: 0.8 10*9/L (ref 0.3–0.8)
MONOCYTES RELATIVE PERCENT: 9.8 %
NEUTROPHILS ABSOLUTE COUNT: 5.1 10*9/L (ref 1.8–7.8)
NEUTROPHILS RELATIVE PERCENT: 59.8 %
PLATELET COUNT: 270 10*9/L (ref 150–450)
RED BLOOD CELL COUNT: 3.98 10*12/L — ABNORMAL LOW (ref 4.26–5.60)
RED CELL DISTRIBUTION WIDTH: 16.5 % — ABNORMAL HIGH (ref 12.2–15.2)
WBC ADJUSTED: 8.6 10*9/L (ref 3.6–11.2)

## 2020-10-06 LAB — COMPREHENSIVE METABOLIC PANEL
ALBUMIN: 3.7 g/dL (ref 3.4–5.0)
ALKALINE PHOSPHATASE: 75 U/L (ref 46–116)
ALT (SGPT): 18 U/L (ref 10–49)
ANION GAP: 3 mmol/L — ABNORMAL LOW (ref 5–14)
AST (SGOT): 16 U/L (ref <=34)
BILIRUBIN TOTAL: 0.2 mg/dL — ABNORMAL LOW (ref 0.3–1.2)
BLOOD UREA NITROGEN: 17 mg/dL (ref 9–23)
BUN / CREAT RATIO: 9
CALCIUM: 9.5 mg/dL (ref 8.7–10.4)
CHLORIDE: 110 mmol/L — ABNORMAL HIGH (ref 98–107)
CO2: 24 mmol/L (ref 20.0–31.0)
CREATININE: 1.87 mg/dL — ABNORMAL HIGH
EGFR CKD-EPI (2021) MALE: 46 mL/min/{1.73_m2} — ABNORMAL LOW (ref >=60)
GLUCOSE RANDOM: 84 mg/dL (ref 70–179)
POTASSIUM: 3.8 mmol/L (ref 3.4–4.8)
PROTEIN TOTAL: 6.8 g/dL (ref 5.7–8.2)
SODIUM: 137 mmol/L (ref 135–145)

## 2020-10-06 LAB — SLIDE REVIEW

## 2020-10-06 LAB — C-REACTIVE PROTEIN: C-REACTIVE PROTEIN: 53 mg/L — ABNORMAL HIGH (ref <=10.0)

## 2020-10-06 LAB — MAGNESIUM: MAGNESIUM: 1.9 mg/dL (ref 1.6–2.6)

## 2020-10-06 LAB — PHOSPHORUS: PHOSPHORUS: 3.4 mg/dL (ref 2.4–5.1)

## 2020-10-06 MED ORDER — OXYCONTIN 15 MG TABLET,CRUSH RESISTANT,EXTENDED RELEASE
0 days
Start: 2020-10-06 — End: ?

## 2020-10-06 MED ADMIN — blinatumomab (BLINCYTO) 28 mcg/day in sodium chloride NON-PVC 0.9 % IVPB (7-DAY HOME INFUSION): 28 ug/d | INTRAVENOUS | @ 22:00:00 | Stop: 2020-10-13

## 2020-10-06 MED ADMIN — dexamethasone (DECADRON) 4 mg/mL injection 16 mg: 16 mg | INTRAVENOUS | @ 20:00:00 | Stop: 2020-10-06

## 2020-10-07 NOTE — Unmapped (Signed)
Noeh Sparacino called me to check in because Adam Keith is still very short of breath and coughing a lot.   I recommended taking the molnupiravir course that they had at home to see if it helped.   Will move up CT chest scan, possibly to when they are here next Tuesday.  Obtain a pulse ox to follow at home with exercise and sleep.   Counseled on smoking cessation.  Referral to pulmonary medicine.

## 2020-10-07 NOTE — Unmapped (Signed)
Patient presented to infusion bed 56 accompanied by staff as per COVID protocol. VS stable, Port accessed without difficulty with 20g 3/4 inch power needle and CHG gel with dressing for home infusion pump connection. Flushes well and blood return noted. Labs drawn and awaited results prior to releasing. Labs within treatment parameters. Decadron given and awaiting delivery of medication and pump. Medication delayed from pharmacy. Pump settings verified with second nurse and pump confirmed running as reading on pump screen. Additional batteries given to patient and pump to run for 7 days and he returns 7/12 for bag change and re access of port. Dressing intact and patient was discharged via wheelchair accompanied by staff members as per protocol.

## 2020-10-08 MED FILL — ICLUSIG 30 MG TABLET: ORAL | 30 days supply | Qty: 30 | Fill #4

## 2020-10-13 ENCOUNTER — Ambulatory Visit: Admit: 2020-10-13 | Discharge: 2020-10-14 | Payer: MEDICAID

## 2020-10-13 ENCOUNTER — Other Ambulatory Visit: Admit: 2020-10-13 | Discharge: 2020-10-14 | Payer: MEDICAID

## 2020-10-13 DIAGNOSIS — C9100 Acute lymphoblastic leukemia not having achieved remission: Principal | ICD-10-CM

## 2020-10-13 DIAGNOSIS — C9101 Acute lymphoblastic leukemia, in remission: Principal | ICD-10-CM

## 2020-10-13 LAB — CBC W/ AUTO DIFF
BASOPHILS ABSOLUTE COUNT: 0.2 10*9/L — ABNORMAL HIGH (ref 0.0–0.1)
BASOPHILS RELATIVE PERCENT: 1.4 %
EOSINOPHILS ABSOLUTE COUNT: 0.3 10*9/L (ref 0.0–0.5)
EOSINOPHILS RELATIVE PERCENT: 1.6 %
HEMATOCRIT: 40.1 % (ref 39.0–48.0)
HEMOGLOBIN: 13.8 g/dL (ref 12.9–16.5)
LYMPHOCYTES ABSOLUTE COUNT: 3.9 10*9/L — ABNORMAL HIGH (ref 1.1–3.6)
LYMPHOCYTES RELATIVE PERCENT: 21.7 %
MEAN CORPUSCULAR HEMOGLOBIN CONC: 34.3 g/dL (ref 32.0–36.0)
MEAN CORPUSCULAR HEMOGLOBIN: 32.3 pg (ref 25.9–32.4)
MEAN CORPUSCULAR VOLUME: 94.2 fL (ref 77.6–95.7)
MEAN PLATELET VOLUME: 7.6 fL (ref 6.8–10.7)
MONOCYTES ABSOLUTE COUNT: 1.4 10*9/L — ABNORMAL HIGH (ref 0.3–0.8)
MONOCYTES RELATIVE PERCENT: 7.7 %
NEUTROPHILS ABSOLUTE COUNT: 12 10*9/L — ABNORMAL HIGH (ref 1.8–7.8)
NEUTROPHILS RELATIVE PERCENT: 67.6 %
PLATELET COUNT: 331 10*9/L (ref 150–450)
RED BLOOD CELL COUNT: 4.26 10*12/L (ref 4.26–5.60)
RED CELL DISTRIBUTION WIDTH: 16.6 % — ABNORMAL HIGH (ref 12.2–15.2)
WBC ADJUSTED: 17.8 10*9/L — ABNORMAL HIGH (ref 3.6–11.2)

## 2020-10-13 LAB — MAGNESIUM: MAGNESIUM: 1.9 mg/dL (ref 1.6–2.6)

## 2020-10-13 LAB — COMPREHENSIVE METABOLIC PANEL
ALBUMIN: 3.9 g/dL (ref 3.4–5.0)
ALKALINE PHOSPHATASE: 102 U/L (ref 46–116)
ALT (SGPT): 41 U/L (ref 10–49)
ANION GAP: 3 mmol/L — ABNORMAL LOW (ref 5–14)
AST (SGOT): 24 U/L (ref <=34)
BILIRUBIN TOTAL: 0.2 mg/dL — ABNORMAL LOW (ref 0.3–1.2)
BLOOD UREA NITROGEN: 18 mg/dL (ref 9–23)
BUN / CREAT RATIO: 10
CALCIUM: 9.1 mg/dL (ref 8.7–10.4)
CHLORIDE: 107 mmol/L (ref 98–107)
CO2: 25 mmol/L (ref 20.0–31.0)
CREATININE: 1.86 mg/dL — ABNORMAL HIGH
EGFR CKD-EPI (2021) MALE: 46 mL/min/{1.73_m2} — ABNORMAL LOW (ref >=60)
GLUCOSE RANDOM: 82 mg/dL (ref 70–179)
POTASSIUM: 4.1 mmol/L (ref 3.4–4.8)
PROTEIN TOTAL: 6.9 g/dL (ref 5.7–8.2)
SODIUM: 135 mmol/L (ref 135–145)

## 2020-10-13 LAB — PHOSPHORUS: PHOSPHORUS: 3.8 mg/dL (ref 2.4–5.1)

## 2020-10-13 LAB — SLIDE REVIEW

## 2020-10-13 MED ADMIN — blinatumomab (BLINCYTO) 28 mcg/day in sodium chloride NON-PVC 0.9 % IVPB (7-DAY HOME INFUSION): 28 ug/d | INTRAVENOUS | @ 17:00:00 | Stop: 2020-10-20

## 2020-10-13 NOTE — Unmapped (Signed)
Blina infusion paused, withdrew waste, then flushed and de-accessed port. RN KBavin Re-accessed port with new needles, labs drawn. Pump re-connected and reads as running.

## 2020-10-13 NOTE — Unmapped (Signed)
1305: Pt here for scheduled pump bag change.     1325: Pt tolerating home treatment  W/O difficulty. Home IV pump changed over to new bag. Port left accessed per protocol for home infusion.   Pt left infusion center ambulatory. NAD, no questions nor complaints voiced at D/C. Pt aware of follow up.

## 2020-10-13 NOTE — Unmapped (Signed)
Labs found to be within parameters for treatment today. Request for drug sent to pharmacy.

## 2020-10-13 NOTE — Unmapped (Addendum)
As requested, I have contacted the patient to review/clarify the following:    ?? Adam Keith presented to [facility] today as scheduled for his sequential blinatumomab bag change.    ?? With regard to testing positive for SARS-CoV-2 on 09/21/2020, it was reviewed with the patient that he completed the full course of molnupiravir on Sunday October 11, 2020.  ?? In addition, the patient was also able to confirm that he has not obtained a home SPO2 device just yet.    ?? The patient continues to report experiencing the following symptoms:  ?? Intermittent to frequent episodes of coughing  ?? Nasal/chest congestion  ?? Head cold associated symptoms  ?? Reassuringly, the symptoms noted above are no worse and are described as continuing to linger.  ?? No reports of any fever.    ?? The patient has also required if a series of appointments can be adjusted to minimize lab draws despite seeing the care team on days not associated with blinatumomab bag changes as follows:  ?? Cancel labs on 10/21/2020 in conjunction with seeing [name], NP as lab work will have been collected on 10/20/2020 along with the corresponding blinatumomab bag change.    ?? Cancel labs on 10/28/2020 in conjunction with seeing [name] as lab work will have been collected on 10/27/2020 along with the corresponding blinatumomab bag change.    ?? In addition, the patient has inquired whether or not clinic follow-up on 10/20/2020 and again on 10/27/2020 could be consolidated and/or potentially transition to telehealth.  ?? This request has been shared with the Leukemia Care Team for review.    ?? Moreover, the patient is in agreement with starting cycle 4 of blinatumomab to start on 11/12/2020 if approved by the care team.    Otherwise, the patient expressed verbal understanding and had no other questions or concerns at this time.  He is in agreement with the plan of care outlined above and will contact clinic back sooner should he have any other issues.

## 2020-10-14 DIAGNOSIS — R0600 Dyspnea, unspecified: Principal | ICD-10-CM

## 2020-10-14 DIAGNOSIS — B49 Unspecified mycosis: Principal | ICD-10-CM

## 2020-10-14 NOTE — Unmapped (Signed)
Is the visit  moving from 7/20 to 7/19 is that ok to be a virtual visit?

## 2020-10-15 NOTE — Unmapped (Signed)
Completed.

## 2020-10-15 NOTE — Unmapped (Signed)
Ok here is my dilemma Adam Keith only has a 8:00 time slot left and his bag change is not until 12:00 and infusion is not coming him time up this late. Could we use and emergent or end slot for Adam Keith?

## 2020-10-15 NOTE — Unmapped (Signed)
Spoke with Adam Keith about scheduling a clinic visit for 7/19 with [facility].

## 2020-10-20 ENCOUNTER — Ambulatory Visit: Admit: 2020-10-20 | Discharge: 2020-10-21 | Payer: MEDICAID | Attending: Adult Health | Primary: Adult Health

## 2020-10-20 ENCOUNTER — Ambulatory Visit: Admit: 2020-10-20 | Discharge: 2020-10-21 | Payer: MEDICAID

## 2020-10-20 ENCOUNTER — Other Ambulatory Visit: Admit: 2020-10-20 | Discharge: 2020-10-21 | Payer: MEDICAID

## 2020-10-20 DIAGNOSIS — A419 Sepsis, unspecified organism: Principal | ICD-10-CM

## 2020-10-20 DIAGNOSIS — C9100 Acute lymphoblastic leukemia not having achieved remission: Principal | ICD-10-CM

## 2020-10-20 DIAGNOSIS — C9101 Acute lymphoblastic leukemia, in remission: Principal | ICD-10-CM

## 2020-10-20 MED ORDER — CRESEMBA 186 MG CAPSULE
ORAL_CAPSULE | Freq: Every day | ORAL | 2 refills | 28 days | Status: CP
Start: 2020-10-20 — End: ?
  Filled 2020-10-27: qty 56, 28d supply, fill #0

## 2020-10-20 MED ADMIN — blinatumomab (BLINCYTO) 28 mcg/day in sodium chloride NON-PVC 0.9 % IVPB (7-DAY HOME INFUSION): 28 ug/d | INTRAVENOUS | @ 17:00:00 | Stop: 2020-10-27

## 2020-10-20 NOTE — Unmapped (Signed)
Lab on 10/20/2020   Component Date Value Ref Range Status    Sodium 10/20/2020 135  135 - 145 mmol/L Final    Potassium 10/20/2020 4.3  3.4 - 4.8 mmol/L Final    Chloride 10/20/2020 106  98 - 107 mmol/L Final    CO2 10/20/2020 23.0  20.0 - 31.0 mmol/L Final    Anion Gap 10/20/2020 6  5 - 14 mmol/L Final    BUN 10/20/2020 18  9 - 23 mg/dL Final    Creatinine 16/01/9603 1.90 (A) 0.60 - 1.10 mg/dL Final    BUN/Creatinine Ratio 10/20/2020 9   Final    eGFR CKD-EPI (2021) Male 10/20/2020 45 (A) >=60 mL/min/1.65m2 Final    eGFR calculated with CKD-EPI 2021 equation in accordance with [employer] and [employer] of Nephrology Task Force recommendations.    Glucose 10/20/2020 90  70 - 179 mg/dL Final    Calcium 54/12/8117 9.1  8.7 - 10.4 mg/dL Final    Albumin 14/78/2956 4.0  3.4 - 5.0 g/dL Final    Total Protein 10/20/2020 6.6  5.7 - 8.2 g/dL Final    Total Bilirubin 10/20/2020 0.2 (A) 0.3 - 1.2 mg/dL Final    AST 21/30/8657 18  <=34 U/L Final    ALT 10/20/2020 31  10 - 49 U/L Final    Alkaline Phosphatase 10/20/2020 96  46 - 116 U/L Final    Magnesium 10/20/2020 1.7  1.6 - 2.6 mg/dL Final    Phosphorus 84/69/6295 3.1  2.4 - 5.1 mg/dL Final    CRP 28/41/3244 15.0 (A) <=10.0 mg/L Final    WBC 10/20/2020 20.7 (A) 3.6 - 11.2 10*9/L Final    RBC 10/20/2020 4.12 (A) 4.26 - 5.60 10*12/L Final    HGB 10/20/2020 13.6  12.9 - 16.5 g/dL Final    HCT 04/06/7251 38.6 (A) 39.0 - 48.0 % Final    MCV 10/20/2020 93.7  77.6 - 95.7 fL Final    MCH 10/20/2020 32.9 (A) 25.9 - 32.4 pg Final    MCHC 10/20/2020 35.2  32.0 - 36.0 g/dL Final    RDW 66/44/0347 16.5 (A) 12.2 - 15.2 % Final    MPV 10/20/2020 7.9  6.8 - 10.7 fL Final    Platelet 10/20/2020 246  150 - 450 10*9/L Final    Neutrophils % 10/20/2020 84.0  % Final    Lymphocytes % 10/20/2020 9.4  % Final    Monocytes % 10/20/2020 5.3  % Final    Eosinophils % 10/20/2020 0.6  % Final    Basophils % 10/20/2020 0.7  % Final    Absolute Neutrophils 10/20/2020 17.4 (A) 1.8 - 7.8 10*9/L Final    Absolute Lymphocytes 10/20/2020 1.9  1.1 - 3.6 10*9/L Final    Absolute Monocytes 10/20/2020 1.1 (A) 0.3 - 0.8 10*9/L Final    Absolute Eosinophils 10/20/2020 0.1  0.0 - 0.5 10*9/L Final    Absolute Basophils 10/20/2020 0.2 (A) 0.0 - 0.1 10*9/L Final    Anisocytosis 10/20/2020 Slight (A) Not Present Final

## 2020-10-20 NOTE — Unmapped (Signed)
[facility]    Patient Name: Adam Keith  Patient Age: 40 y.o.  Encounter Date: 10/20/2020    Primary Care Provider:  [name], MD    Referring Physician:  Referred Self  No address on file    Reason for visit:  Ph+ B-ALL follow up to consider timing of therapy change    Assessment:  A 40 y.o. 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 The course was complicated by septic shock, candida krusei fungemia, MRSA bacteremia with septic emboli c/b acute renal failure and respiratory distress requiring dialysis and intubation.  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  He subsequently had difficulty tolerating single agent dasatinib (as a bridge to planned ponatinib-blinatumomab--had fluid retention and pleural effusion).        He is now Day 15 cycle 3 of ponatinib-blinatumomab. BCR-ABL after Cycle 2 was 0.005% in the marrow. His symptoms were slow to improve s/p COVID-19 infection but he reports symptoms are now resolved. He has fatigue and heat-intolerance when receiving the blina, but is otherwise tolerating.    The plan remains to complete 5 cycles.  We will check BCR-ABL level (peripheral blood) each cycle.  If the BCR-ABL level becomes undetectable we will dose reduce the ponatinib to 15 mg.    IVIG will be continued - once every 6 weeks.  Continues to be followed by [facility] -they have planned a repeat Chest CT scheduled for tomorrow  Rash has not recurred, but he does continue to itch. He continues the steroids for DRESS and is followed by Dermatology    Sept 9-11 - [place] trip. Per current schedule he should complete the course of blina the day prior.     Plan and Recommendations:  Ph+ B-ALL: CSF with rare blasts 12/6, now in CR (low level BCR-ABL) and no blasts in CSF  -Continue Cycle 3 blinatumomab-ponatinib   - Ponatinib 30mg  daily until BCR-ABL level is undetectable, then decrease to 15mg    - Continue aspirin while on ponatinib   - Plan a total of 5 cycles and then transition to ponatinib maintenance   - next bone marrow biopsy planned for the end of cycle 5   - IT triple therapy 1x per cycle  -could consider intensification of therapy at later date, depending on continued PS and renal function improvements    DRESS: followed by Dr. Caryn Section, dermatolgy  - rash currently resolved but itching persists  - continue prednisone     Dyspnea, PND, edema, pleural effusion:  Likely due to dasatinib.  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 Now s/p dialysis  - stable Cr appears to have developed CKD due to incompletely healed acute injury.  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 [name]  **hx Pneumonia - MRSA likely, fungal tests negative  - Chest CT in July, per Dr. [name]   - Covid-19   - symptoms now resolved    Symptom management: bowel regimen as listed in medication list, analgesics as listed in medication list or symptoms reviewed and due to acceptable control, no changes made  **Peripheral neuropathy, sensory  and motor  - Grade 1-2 monitor  **Chronic Back Pain, complicated by hospital stay and procedures  - Followed by Dr. Genice Rouge, Palliative Care  **Depression/anxiety: met with [name], [facility]  - nortriptyline  - PRN clonazepam  - has not been using PRN hydroxyzine      Venous access: port-a-cath in place     Supportive Care Recommendations:  We recommend based on the patient???s underlying diagnosis and treatment history the following supportive care:      1. Antimicrobial prophylaxis:    - Viral - valacyclovir 500mg  daily  - Bacterial - on treatment linezolid currently  - Fungal - on treatment isavuconazole  - PJP - Bactrim DS BID Sat/Sun  -Evusheld 04/21/2020-recommend another dose to achieve FDA recommendation update.    2. Blood product support:  I recommend the following intervals for laboratory monitoring to determine transfusion needs and monitor for hematologic effects of therapy and underlying disease: no routine monitoring outside of clinic follow-up    Leukoreduced blood products are required.  Irradiated blood products are preferred, but in case of urgent transfusion needs non-irradiated blood products may be used:     -  RBC transfusion threshold: transfuse 2 units for Hgb < 8 g/dL.  -  Platelet transfusion threshold: transfuse 1 unit of platelets for platelet count < 10, or for bleeding or need for invasive procedure.    3. Hematopoietic growth factor support: none      Care coordination: The next [facility] Follow up requested:  - see notes above  - IT is scheduled    These services include the following elements of medical decision making:  addressing a hematologic cancer that poses a threat to life and/or bone marrow function  interpretation of diagnostic test reports or ordering of diagnostic tests and independent interpretation of diagnostic tests  High risk of morbidity due to drug therapy requiring monitoring for toxicity or decisions regarding goals and continuation of antineoplastic therapy    [name], AGPCNP-BC  Nurse Practitioner  Hematology/Oncology  [facility]      Troy Sine. [name], MD  Leukemia Program  Division of Hematology/Oncology  [facility]    Nurse Navigator (non-clinical trial patients): Colletta Maryland, RN        Tel. [phone]       Fax. [phone]  Toll-free appointments: [phone]  Scheduling assistance: [phone]  After hours/weekends: [phone] (ask for adult hematology/oncology on-call)        History of Present Illness:  We had the pleasure of seeing Adam Keith in the Leukemia Clinic at the [facility] on 10/20/2020.  He is a 40 y.o. male with Ph+ B-ALL.          His oncologic history is as follows:      Oncology History Overview Note   Referring/Local Oncologist: None    Diagnosis:Ph+ ALL    Genetics:    Karyotype/FISH:Abnormal Karyotype: 46,XY,t(9;22)(q34;q11.2)[1]/45,XY,der(7;9)(q10;q10)t(9;22)(q34;q11.2),der(22)t(9;22)[11]/46,sdl,+der(22)t(9;22)[5]/46,XY[3]     Abnormal FISH: A BCR/ABL1 interphase FISH assay shows an abnormal signal pattern in 97% of the 100 cells scored. 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  The findings support a diagnosis of leukemia and have implications for targeted therapy and for monitoring residual disease.      Molecular Genetics:  BCR-ABL1 p210 transcripts were detected at a level of 46.479 IS% ratio in bone marrow.  BCR-ABL1 p190  transcripts were detected at a level of 4 in 100,000 cells in bone marrow.    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)     01/24/2020 - 04/02/2020 Chemotherapy    IP/OP LEUKEMIA GRAAPH-2005 + RITUXIMAB < 60 YO  rituximab hypercvad (odd and even course)     02/24/2020 Remission    CR with PCR MRD+; marrow showing 70% blasts with <1% blasts, negative flow MRD, BCR-ABL p210 PCR 0.197%; CNS negative for blasts     03/09/2020 Progression    CSF - rare blast ID'ed - IT chemo given    03/13/20 - IT chemo, CSF negative     04/16/2020 Biopsy    BM Bx with 40-50% cellularity, <1% blasts, MRD flow cytometry negative, BCR-ABL p210 transcripts 0.036%     05/28/2020 - 05/28/2020 Chemotherapy    OP AML - CNS THERAPY (INTRATHECAL CYTARABINE, INTRATHECAL METHOTREXATE, OR INTRATHECAL TRIPLE)  Select one of the following: cytarabine IT 100 mg with hydrocortisone 50 mg, cytarabine IT 40 mg with methotrexate 15 mg with hydrocortisone 50 mg, OR methotrexate IT 12 mg with hydrocortisone 50 mg     06/19/2020 -  Chemotherapy    IP/OP LEUKEMIA BLINATUMOMAB 7-DAY INFUSION (MINIMAL RESIDUAL DISEASE; WT >= 22 KG) (HOME INFUSION)      Cycles 1*-4: Blinatumomab 28 mcg/day Days 1-28 of 6-week cycle.  *Given in the inpatient setting on Days 1-3 on Cycle 1 and Days 1-2 on Cycle 2, while other treatment days are given in the outpatient setting.    Cycle 1 MRD Dosing     07/18/2020 Adverse Reaction    Hospitalization: fevers. Pneumonia     07/23/2020 Biopsy    Diagnosis  Bone marrow, right iliac, aspiration and biopsy  -   Normocellular bone marrow (50%) with trilineage hematopoiesis and 1% blasts by manual aspirate differential  -   Flow cytometry MRD analysis reveals no definitive immunophenotypic evidence of residual B lymphoblastic leukemia   - BCR-ABL p210 0.006%         08/10/2020 -  Chemotherapy    Cycle 2 blinatumomab-ponatinib  Ponatinib 30mg     1 IT per cycle     09/21/2020 Adverse Reaction    Covid-19, symptomatic but not requiring hospitalization.     ALL (acute lymphoblastic leukemia) (CMS-HCC)   06/11/2020 -  Chemotherapy    IP/OP LEUKEMIA BLINATUMOMAB 7-DAY INFUSION (MINIMAL RESIDUAL DISEASE; WT >= 22 KG) (HOME INFUSION)  Cycles 1*-4: Blinatumomab 28 mcg/day Days 1-28 of 6-week cycle.  *Given in the inpatient setting on Days 1-3 on Cycle 1 and Days 1-2 on Cycle 2, while other treatment days are given in the outpatient setting.     09/21/2020 Initial Diagnosis    ALL (acute lymphoblastic leukemia) (CMS-HCC)         Interim History:  Since his last visit he started Cycle 3 of blina. He reports the symptoms from Covid were slow to improve and he questions if they were actually covid-related symptoms vs another viral infection because he did not notice a difference with the covid therapeutics.    He reports that he is tired and not tolerating the heat - both of which are worse when he is on blina infusions. Otherwise he reports he is tolerating okay.    Skin - no new  rashes. He does have occasional itching. The prednisone dose has not changed in a couple of months (15mg  pred).      Past Medical, Surgical and Family History were reviewed and pertinent updates were made in the Electronic Medical Record      ECOG Performance Status: 1    Medications:    Current Outpatient Medications   Medication Sig Dispense Refill   ??? acetaminophen (TYLENOL) 325 MG tablet Take 650 mg by mouth every six (6) hours as needed for pain.      ??? amLODIPine (NORVASC) 10 MG tablet Take 1 tablet (10 mg total) by mouth daily. 30 tablet 0   ??? aspirin 81 MG chewable tablet Chew 1 tablet (81 mg total) daily. 36 tablet 11   ??? carvediloL (COREG) 12.5 MG tablet TAKE 1 TABLET BY MOUTH TWO TIMES A DAY. 180 tablet 1   ??? clonazePAM (KLONOPIN) 0.5 MG tablet Take 1 tablet (0.5 mg total) by mouth two (2) times a day as needed for anxiety or sleep. 60 tablet 0   ??? gabapentin (NEURONTIN) 300 MG capsule Take 1 capsule (300 mg total) by mouth Three (3) times a day. 270 capsule 6   ??? hydrOXYzine (ATARAX) 25 MG tablet Take 1 tablet (25 mg total) by mouth every six (6) hours as needed. 60 tablet 1   ??? nortriptyline (PAMELOR) 50 MG capsule Take 1 capsule (50 mg total) by mouth nightly. 30 capsule 11   ??? OXYCONTIN 15 mg 12 hr crush resistant ER/CR tablet TAKE 1 TABLET (15 MG TOTAL) BY MOUTH TWO (2) TIMES A DAY FOR 28 DAYS.     ??? PONATinib (ICLUSIG) 30 mg tablet Take 1 tablet (30 mg total) by mouth daily. Swallow tablets whole. Do not crush, break, cut or chew tablets. 30 tablet 5   ??? prochlorperazine (COMPAZINE) 10 MG tablet Take 1 tablet (10 mg total) by mouth every six (6) hours as needed for nausea (if no relief from zofran (ondansetron)). 60 tablet 1   ??? sulfamethoxazole-trimethoprim (BACTRIM DS) 800-160 mg per tablet Take 1 tablet (160 mg of trimethoprim total) by mouth 2 times a day on Saturday, Sunday. For prophylaxis while on chemo. 48 tablet 3   ??? valACYclovir (VALTREX) 500 MG tablet Take 1 tablet (500 mg total) by mouth daily. 90 tablet 11   ??? isavuconazonium sulfate (CRESEMBA) 186 mg cap capsule Take 2 capsules (372 mg total) by mouth daily. 56 capsule 2     No current facility-administered medications for this visit.     Facility-Administered Medications Ordered in Other Visits   Medication Dose Route Frequency Provider Last Rate Last Admin   ??? blinatumomab (BLINCYTO) 28 mcg/day in sodium chloride NON-PVC 0.9 % IVPB (7-DAY HOME INFUSION)  28 mcg/day Intravenous over 168 hr Guerry Bruin, MD       ??? OKAY TO SEND MEDICATION/CHEMOTHERAPY TO UNIT   Other Once Guerry Bruin, MD           Vital Signs:  Vitals:    10/20/20 0922   BP: 158/98   Pulse: 87   Resp: 18   Temp: 36.6 ??C (97.9 ??F)   SpO2: 99%         Relevant Physical Exam Findings:  General: Resting in no apparent distress, accompanied by spouse  HEENT:  Clear sclera, conjunctiva, mask in place  LYMPH:  no palpable cervical, supraclavicular, axillary, or inguinal nodes  CARDAC: RRR, no R,M,Gs, no pitting peripheral edema  RESP: nonlabored, bilaterally CTA  GI:  Soft, nontender, active bowel sounds, no hepatic or splenomegaly  NEURO: alert, 0x4, steady gait, no focal deficits  PSYCH: appropriate  DERM: no visible rashes, lesions  LINE: none      Relevant Laboratory, radiology and pathology results:  I personally viewed the most recent internal records, laboratory results, hematopathology report and molecular pathology reports  and discussed the available results with the patient and family  A summary of results follows:  Lab on 10/20/2020   Component Date Value Ref Range Status   ??? Sodium 10/20/2020 135  135 - 145 mmol/L Final   ??? Potassium 10/20/2020 4.3  3.4 - 4.8 mmol/L Final   ??? Chloride 10/20/2020 106  98 - 107 mmol/L Final   ??? CO2 10/20/2020 23.0  20.0 - 31.0 mmol/L Final   ??? Anion Gap 10/20/2020 6  5 - 14 mmol/L Final   ??? BUN 10/20/2020 18  9 - 23 mg/dL Final   ??? Creatinine 10/20/2020 1.90 (A) 0.60 - 1.10 mg/dL Final   ??? BUN/Creatinine Ratio 10/20/2020 9   Final   ??? eGFR CKD-EPI (2021) Male 10/20/2020 45 (A) >=60 mL/min/1.15m2 Final    eGFR calculated with CKD-EPI 2021 equation in accordance with [employer] and [employer] of Nephrology Task Force recommendations.   ??? Glucose 10/20/2020 90  70 - 179 mg/dL Final   ??? Calcium 29/56/2130 9.1  8.7 - 10.4 mg/dL Final   ??? Albumin 86/57/8469 4.0  3.4 - 5.0 g/dL Final   ??? Total Protein 10/20/2020 6.6  5.7 - 8.2 g/dL Final   ??? Total Bilirubin 10/20/2020 0.2 (A) 0.3 - 1.2 mg/dL Final   ??? AST 62/95/2841 18  <=34 U/L Final   ??? ALT 10/20/2020 31  10 - 49 U/L Final   ??? Alkaline Phosphatase 10/20/2020 96  46 - 116 U/L Final   ??? Magnesium 10/20/2020 1.7  1.6 - 2.6 mg/dL Final   ??? Phosphorus 10/20/2020 3.1  2.4 - 5.1 mg/dL Final   ??? CRP 32/44/0102 15.0 (A) <=10.0 mg/L Final   ??? WBC 10/20/2020 20.7 (A) 3.6 - 11.2 10*9/L Final   ??? RBC 10/20/2020 4.12 (A) 4.26 - 5.60 10*12/L Final   ??? HGB 10/20/2020 13.6  12.9 - 16.5 g/dL Final   ??? HCT 72/53/6644 38.6 (A) 39.0 - 48.0 % Final   ??? MCV 10/20/2020 93.7  77.6 - 95.7 fL Final   ??? MCH 10/20/2020 32.9 (A) 25.9 - 32.4 pg Final   ??? MCHC 10/20/2020 35.2  32.0 - 36.0 g/dL Final   ??? RDW 03/47/4259 16.5 (A) 12.2 - 15.2 % Final   ??? MPV 10/20/2020 7.9  6.8 - 10.7 fL Final   ??? Platelet 10/20/2020 246  150 - 450 10*9/L Final   ??? Neutrophils % 10/20/2020 84.0  % Final   ??? Lymphocytes % 10/20/2020 9.4  % Final   ??? Monocytes % 10/20/2020 5.3  % Final   ??? Eosinophils % 10/20/2020 0.6  % Final   ??? Basophils % 10/20/2020 0.7  % Final   ??? Absolute Neutrophils 10/20/2020 17.4 (A) 1.8 - 7.8 10*9/L Final   ??? Absolute Lymphocytes 10/20/2020 1.9  1.1 - 3.6 10*9/L Final   ??? Absolute Monocytes 10/20/2020 1.1 (A) 0.3 - 0.8 10*9/L Final   ??? Absolute Eosinophils 10/20/2020 0.1  0.0 - 0.5 10*9/L Final   ??? Absolute Basophils 10/20/2020 0.2 (A) 0.0 - 0.1 10*9/L Final   ??? Anisocytosis 10/20/2020 Slight (A) Not Present Final     Chest X-ray  PA and lateral ??Increased interstitial edema versus atypical infection/inflammation.  ??  Small right pleural effusion.    Diagnosis   Date Value Ref Range Status   09/18/2020   Final    A:  Cerebrospinal fluid, cytospin review    -  Bloody specimen with no blasts identified    This electronic signature is attestation that the pathologist personally reviewed the submitted material(s) and the final diagnosis reflects that evaluation.       Diagnosis   Date Value Ref Range Status   09/18/2020   Final    A:  Cerebrospinal fluid, cytospin review    -  Bloody specimen with no blasts identified    This electronic signature is attestation that the pathologist personally reviewed the submitted material(s) and the final diagnosis reflects that evaluation.     08/07/2020   Final    A:  Cerebrospinal fluid, cytospin review and flow cytometry   -  No blasts identified    This electronic signature is attestation that the pathologist personally reviewed the submitted material(s) and the final diagnosis reflects that evaluation.     07/23/2020   Final    Bone marrow, right iliac, aspiration and biopsy  -   Normocellular bone marrow (50%) with trilineage hematopoiesis and 1% blasts by manual aspirate differential  -   Flow cytometry MRD analysis reveals no definitive immunophenotypic evidence of residual B lymphoblastic leukemia   -  See linked reports for associated Ancillary Studies.      This electronic signature is attestation that the pathologist personally reviewed the submitted material(s) and the final diagnosis reflects that evaluation.

## 2020-10-20 NOTE — Unmapped (Signed)
Patient arrived to chair 12.  No complaints noted.  Access of port NOT forward flushed. CADD pump connected. All clamps opened, green light flashing, and pump reads running.. AVS declined. Patient discharged to home, NAD.

## 2020-10-20 NOTE — Unmapped (Signed)
Port accessed and labs drawn with no complications by Pearline Cables flushed with saline.

## 2020-10-20 NOTE — Unmapped (Signed)
Patient need refill if appropriate.

## 2020-10-21 ENCOUNTER — Telehealth
Admit: 2020-10-21 | Discharge: 2020-10-21 | Payer: MEDICAID | Attending: Student in an Organized Health Care Education/Training Program | Primary: Student in an Organized Health Care Education/Training Program

## 2020-10-21 ENCOUNTER — Ambulatory Visit: Admit: 2020-10-21 | Discharge: 2020-10-21 | Payer: MEDICAID

## 2020-10-21 DIAGNOSIS — G893 Neoplasm related pain (acute) (chronic): Principal | ICD-10-CM

## 2020-10-21 DIAGNOSIS — Z515 Encounter for palliative care: Principal | ICD-10-CM

## 2020-10-21 MED ORDER — OXYCONTIN 15 MG TABLET,CRUSH RESISTANT,EXTENDED RELEASE
ORAL_TABLET | Freq: Two times a day (BID) | ORAL | 0 refills | 28 days | Status: CP
Start: 2020-10-21 — End: 2020-11-18

## 2020-10-21 NOTE — Unmapped (Signed)
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     Assessment/Plan:   1.  Pain: Combination of treatment related pain plus underlying history of chronic pain.  There was miscommunication about how to take his opioids at a previouslye.  However, this has been fixed and pain is significantly improved.  -Continue OxyContin 15 mg PO q 12h  -Hydromorphone 2 mg PO q4h PRN  -Continue gabapentin 300 mg PO TID    2.  Opioid-induced constipation: Improved.  Encouraged him to message [place] if not consistently having regular bowel movements.  -Senna 1 tablet every night    3. Anxiety: Seen by the psychiatry consult liaison service while in the hospital.  Following with [name].  Significantly improved recently  -Nortriptyline 50 mg nightly  -Continue Klonopin 0.5 mg BID as needed  -Given his description of intermittent feelings of physical restlessness associated with his anxiety, could consider propanolol 10 to 20 mg every 8 hours as needed    4.  Advance care planning: Did not address at this visit. General states that he can't wait to be done with all of the cancer treatments. However, currenlty tolerating treatment ok.       # Controlled substances risk management.   - Patient does not have a signed pain medication agreement with our team.   - [place] database was reviewed today and it was appropriate.   - Urine drug screen was not performed at this visit. Findings: not applicable.   - Patient has received information about safe storage and administration of medications.   - Patient has not received a prescription for narcan; is not applicable.       F/u: 4 weeks.  Clinical pharmacist to call 5/2 to check in.    ----------------------------------------  Referring Provider: Dr. Berline Lopes  Oncology Team: Malignant hematology team  PCP: [name], MD      HPI: 40 year old man with a diagnosis of B-ALL, diagnosed in October 2021.  Recently had an 8-day hospitalization, discharged on 3/26.    Current cancer-directed therapy: Blinatumomab (for MRD)    Interval History:   Has been having more difficulty sleeping, attributes it to the chemo that he uses at night. Continues to be active during the day. Not taking the Dilaudid as much, typically taking a couple times a week.     Hopeful he will be done with treatment in October.     Ongoing fatigue. 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 [employer] [place] one of his favorite places.         Back on his cancer treatment and feels like he feels like crap. Feels like he has the flu, feels achey all over and fatigued. Worse at night. Has been able to sleep through the night. Hoping to finish after this bag, hopeful he will done on Monday.     Feels like his pain medication is working well. Taking Oxycontin 15mg  q12h and then Dilaudid 2mg  once a day around 5 or 6. Feels like this has controlled his pain. Also feels like his mood has gotten better. Continues to stay positive. Staying busy and using distraction a lot. Has bene doing more wood working which has been helpful. Is working on coaster, clocks and lots of other projects.     Also enjoys golf, but unfortunately his balance has been keeping. [name] doing  well, working today. Still having regular BMs. Takes senna 1 tab daily, having daily BMs. Previously had IBS and was going 2-3 times a day. Makes a point to go every day.     Nausea has been well controlled.     Symptom Review:  General: Doing very well  Pain: See above  Fatigue: Stable  Mobility: Limited by pain  Sleep: Improved  Appetite: Stable  Nausea: As above  Bowel function: No issues reported  Dyspnea: None  Secretions: None  Mood: Anxiety continues    Palliative Performance Scale: 80% - Ambulation: Full / Normal Activity with effort, some evidence of disease / Self-Care:Full / Intake: Normal or reduced / Level of Conscious: Full    Coping/Support Rx drugs  4  4    Personal history of substance abuse      Alcohol  3  3    Illegal drugs  4  4    Rx drugs  5  5    Age between 16--45 years  1  1    History of preadolescent sexual abuse  3  0    Psychological disease      ADD, OCD, bipolar, schizophrenia  2  2    Depression  1  1       Total: Moderate risk  (<3 low risk, 4-7 moderate risk, >8 high risk)    Oncology History Overview Note   Referring/Local Oncologist: None    Diagnosis:Ph+ ALL    Genetics:    Karyotype/FISH:Abnormal Karyotype: 46,XY,t(9;22)(q34;q11.2)[1]/45,XY,der(7;9)(q10;q10)t(9;22)(q34;q11.2),der(22)t(9;22)[11]/46,sdl,+der(22)t(9;22)[5]/46,XY[3]     Abnormal FISH: A BCR/ABL1 interphase FISH assay shows an abnormal signal pattern in 97% of the 100 cells scored. 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  The findings support a diagnosis of leukemia and have implications for targeted therapy and for monitoring residual disease.      Molecular Genetics:  BCR-ABL1 p210 transcripts were detected at a level of 46.479 IS% ratio in bone marrow.  BCR-ABL1 p190 transcripts were detected at a level of 4 in 100,000 cells in bone marrow.    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)     01/24/2020 - 04/02/2020 Chemotherapy    IP/OP LEUKEMIA GRAAPH-2005 + RITUXIMAB < 60 YO  rituximab hypercvad (odd and even course)     02/24/2020 Remission    CR with PCR MRD+; marrow showing 70% blasts with <1% blasts, negative flow MRD, BCR-ABL p210 PCR 0.197%; CNS negative for blasts     03/09/2020 Progression    CSF - rare blast ID'ed - IT chemo given    03/13/20 - IT chemo, CSF negative     04/16/2020 Biopsy prognostic estimate: High Risk, Ph+       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)   01/23/2020 Initial Diagnosis    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)     01/24/2020 - 04/02/2020 Chemotherapy    IP/OP LEUKEMIA GRAAPH-2005 + RITUXIMAB < 60 YO  rituximab hypercvad (odd and even course)     02/24/2020 Remission    CR with PCR MRD+; marrow showing 70% blasts with <1% blasts, negative flow MRD, BCR-ABL p210 PCR 0.197%;  CNS negative for blasts     03/09/2020 Progression    CSF - rare blast ID'ed - IT chemo given    03/13/20 - IT chemo, CSF negative     04/16/2020 Biopsy    BM Bx with 40-50% cellularity, <1% blasts, MRD flow cytometry negative, BCR-ABL p210 transcripts 0.036%     05/28/2020 - 05/28/2020 Chemotherapy    OP AML - CNS THERAPY (INTRATHECAL CYTARABINE, INTRATHECAL METHOTREXATE, OR INTRATHECAL TRIPLE)  Select one of the following: cytarabine IT 100 mg with hydrocortisone 50 mg, cytarabine IT 40 mg with methotrexate 15 mg with hydrocortisone 50 mg, OR methotrexate IT 12 mg with hydrocortisone 50 mg     06/19/2020 -  Chemotherapy    IP/OP LEUKEMIA BLINATUMOMAB 7-DAY INFUSION (MINIMAL RESIDUAL DISEASE; WT >= 22 KG) (HOME INFUSION)      Cycles 1*-4: Blinatumomab 28 mcg/day Days 1-28 of 6-week cycle.  *Given in the inpatient setting on Days 1-3 on Cycle 1 and Days 1-2 on Cycle 2, while other treatment days are given in the outpatient setting.    Cycle 1 MRD Dosing     07/18/2020 Adverse Reaction    Hospitalization: fevers. Pneumonia     07/23/2020 Biopsy    Diagnosis  Bone marrow, right iliac, aspiration and biopsy  -   Normocellular bone marrow (50%) with trilineage hematopoiesis and 1% blasts by manual aspirate differential  -   Flow cytometry MRD analysis reveals no definitive immunophenotypic evidence of residual B lymphoblastic leukemia   - BCR-ABL p210 0.006%         08/10/2020 -  Chemotherapy    Cycle 2 blinatumomab-ponatinib  Ponatinib 30mg     1 IT per cycle     09/21/2020 Adverse Reaction    Covid-19, symptomatic but not requiring hospitalization.     ALL (acute lymphoblastic leukemia) (CMS-HCC)   06/11/2020 -  Chemotherapy    IP/OP LEUKEMIA BLINATUMOMAB 7-DAY INFUSION (MINIMAL RESIDUAL DISEASE; WT >= 22 KG) (HOME INFUSION)  Cycles 1*-4: Blinatumomab 28 mcg/day Days 1-28 of 6-week cycle.  *Given in the inpatient setting on Days 1-3 on Cycle 1 and Days 1-2 on Cycle 2, while other treatment days are given in the outpatient setting.     09/21/2020 Initial Diagnosis    ALL (acute lymphoblastic leukemia) (CMS-HCC)         Patient Active Problem List   Diagnosis   ??? Tobacco use disorder   ??? AKI (acute kidney injury) (CMS-HCC)   ??? Transaminitis   ??? 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 Hyperphosphatemia   ??? MRSA bacteremia   ??? Septic shock due to Staphylococcus aureus (CMS-HCC)   ??? Hyperkalemia   ??? Hiccups   ??? Hypernatremia   ??? Red blood cell antibody positive   ??? Hypokalemia   ??? Hypomagnesemia   ??? COVID-19   ??? Dyspnea   ??? Recurrent major depressive disorder, in partial remission (CMS-HCC)   ??? PTSD (post-traumatic stress disorder)   ??? Anxiety   ??? Insomnia   ??? Fever   ??? Immunocompromised (CMS-HCC)   ??? Hypogammaglobulinemia (CMS-HCC)   ??? ALL (acute lymphoblastic leukemia) (CMS-HCC)       Past Medical History:   Diagnosis Date   ??? 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 BONE MARROW BIOPSY & ASPIRATION  01/21/2020        ??? IR INSERT PORT  AGE GREATER THAN 5 YRS  03/10/2020    IR INSERT PORT AGE GREATER THAN 5 YRS 03/10/2020 [name], MD IMG VIR [facility]   ??? PR BRONCHOSCOPY,DIAGNOSTIC W LAVAGE Bilateral 07/20/2020    Procedure: BRONCHOSCOPY, RIGID OR FLEXIBLE, INCLUDE FLUOROSCOPIC GUIDANCE WHEN PERFORMED; W/BRONCHIAL ALVEOLAR LAVAGE WITH MODERATE SEDATION;  Surgeon: Dellis Filbert, MD;  Location: BRON[facility] PROCEDURE LAB [facility];  Service: Pulmonary total) by mouth Three (3) times a day. 270 capsule 6   ??? hydrOXYzine (ATARAX) 25 MG tablet Take 1 tablet (25 mg total) by mouth every six (6) hours as needed. 60 tablet 1   ??? isavuconazonium sulfate (CRESEMBA) 186 mg cap capsule Take 2 capsules (372 mg total) by mouth daily. 56 capsule 2   ??? nortriptyline (PAMELOR) 50 MG capsule Take 1 capsule (50 mg total) by mouth nightly. 30 capsule 11   ??? OXYCONTIN 15 mg 12 hr crush resistant ER/CR tablet TAKE 1 TABLET (15 MG TOTAL) BY MOUTH TWO (2) TIMES A DAY FOR 28 DAYS.     ??? PONATinib (ICLUSIG) 30 mg tablet Take 1 tablet (30 mg total) by mouth daily. Swallow tablets whole. Do not crush, break, cut or chew tablets. 30 tablet 5   ??? prochlorperazine (COMPAZINE) 10 MG tablet Take 1 tablet (10 mg total) by mouth every six (6) hours as needed for nausea (if no relief from zofran (ondansetron)). 60 tablet 1   ??? sulfamethoxazole-trimethoprim (BACTRIM DS) 800-160 mg per tablet Take 1 tablet (160 mg of trimethoprim total) by mouth 2 times a day on Saturday, Sunday. For prophylaxis while on chemo. 48 tablet 3   ??? valACYclovir (VALTREX) 500 MG tablet Take 1 tablet (500 mg total) by mouth daily. 90 tablet 11     No current facility-administered medications for this visit.       Allergies:   Allergies   Allergen Reactions   ??? Bupropion Hcl Other (See Comments)     Per patient out of touch with reality, suicidal, homicidal   ??? 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 Onion Anaphylaxis   ??? Vancomycin Analogues      Hearing loss with Lasix  Other reaction(s): Other (See Comments)  Hearing loss  lasix   ??? Bismuth Subsalicylate Nausea And Vomiting   ??? Ceftaroline Fosamil Rash and Other (See Comments)     Rash X 2 and pancytopenia 02/2020   ??? Furosemide      With Vancomycin caused hearing loss  Other reaction(s): Other (See Comments)  With Vancomycin caused hearing loss       Family History:  Cancer-related family history is negative for Comments)     Per patient out of touch with reality, suicidal, homicidal   ??? 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 Onion Anaphylaxis   ??? Vancomycin Analogues      Hearing loss with Lasix  Other reaction(s): Other (See Comments)  Hearing loss  lasix   ??? Bismuth Subsalicylate Nausea And Vomiting   ??? Ceftaroline Fosamil Rash and Other (See Comments)     Rash X 2 and pancytopenia 02/2020   ??? Furosemide      With Vancomycin caused hearing loss  Other reaction(s): Other (See Comments)  With Vancomycin caused hearing loss       Family History:  Cancer-related family history is negative for Melanoma.  He indicated that the status of his neg hx is unknown.  REVIEW OF SYSTEMS:  A comprehensive review of 14 systems was negative except for pertinent positives noted in HPI.      PHYSICAL EXAM:   Vital signs for this encounter: VS reviewed in EPIC.  GEN: Awake and alert; no acute distress but frustrated  PSYCH: Alert. Normal thought processes. Euthymic.  HEENT: Pupils equally round without scleral icterus. No eye drainage. No facial asymmetry.  LUNGS: No increased work of breathing or accessory muscle use.  SKIN: No rashes, petechiae or jaundice noted on visible skin.  NEURO: Ambulatory. Grossly symmetric extremity movement. Memory intact. No seizures or myoclonus.    Lab Results   Component Value Date    CREATININE 1.90 (H) 10/20/2020     Lab Results   Component Value Date    ALKPHOS 96 10/20/2020    BILITOT 0.2 (L) 10/20/2020    BILIDIR 0.10 08/11/2020    PROT 6.6 10/20/2020    ALBUMIN 4.0 10/20/2020    ALT 31 10/20/2020    AST 18 10/20/2020            {    Coding tips - Do not edit this text, it will delete upon signing of note!    ?? Telephone visits [phone] for Physicians and APP??s and [phone] for Non- Physician Clinicians)- Only use minutes on the phone to determine level of service.    ?? Video visits [phone]) - Use both minutes on video and pre/post minutes parent/guardian has also been advised to contact their provider???s office for worsening conditions, and seek emergency medical treatment and/or call 911 if the patient deems either necessary.    Marita [place]

## 2020-10-27 ENCOUNTER — Other Ambulatory Visit: Admit: 2020-10-27 | Discharge: 2020-10-28 | Payer: MEDICAID

## 2020-10-27 ENCOUNTER — Ambulatory Visit: Admit: 2020-10-27 | Discharge: 2020-10-28 | Payer: MEDICAID

## 2020-10-27 ENCOUNTER — Telehealth: Admit: 2020-10-27 | Discharge: 2020-10-28 | Payer: MEDICAID

## 2020-10-27 DIAGNOSIS — F419 Anxiety disorder, unspecified: Principal | ICD-10-CM

## 2020-10-27 DIAGNOSIS — C9100 Acute lymphoblastic leukemia not having achieved remission: Principal | ICD-10-CM

## 2020-10-27 DIAGNOSIS — C9101 Acute lymphoblastic leukemia, in remission: Principal | ICD-10-CM

## 2020-10-27 DIAGNOSIS — F331 Major depressive disorder, recurrent, moderate: Principal | ICD-10-CM

## 2020-10-27 DIAGNOSIS — G4700 Insomnia, unspecified: Principal | ICD-10-CM

## 2020-10-27 MED ORDER — NORTRIPTYLINE 75 MG CAPSULE
ORAL_CAPSULE | Freq: Every evening | ORAL | 11 refills | 30 days | Status: CP
Start: 2020-10-27 — End: 2021-10-27

## 2020-10-27 MED ORDER — CLONAZEPAM 0.5 MG TABLET
ORAL_TABLET | Freq: Two times a day (BID) | ORAL | 1 refills | 30 days | Status: CP | PRN
Start: 2020-10-27 — End: ?

## 2020-10-27 MED ADMIN — blinatumomab (BLINCYTO) 28 mcg/day in sodium chloride NON-PVC 0.9 % IVPB (7-DAY HOME INFUSION): 28 ug/d | INTRAVENOUS | @ 18:00:00 | Stop: 2020-11-03

## 2020-10-27 MED FILL — ASPIRIN 81 MG CHEWABLE TABLET: ORAL | 36 days supply | Qty: 36 | Fill #2

## 2020-10-27 NOTE — Unmapped (Signed)
Port de-accessed & reaccessed.  CHG dressing applied. Labs drawn & sent for analysis.  To next appt.  Care provided by Mariane Masters RN.

## 2020-10-27 NOTE — Unmapped (Signed)
[facility]  Psychiatry   Established Patient E&M Service - Outpatient       Assessment:    Adam Keith presents for follow-up evaluation. He states that he is struggling with worsening depressive and anxiety symptoms over the past few weeks and that he continues to struggle with insomnia. He recognizes that he sleeps a lot during the day, which causes him to awaken around 2:30 am and he can't get back to sleep. Providing education re: sleep hygiene. Increasing his nortriptyline from 50 mg to 75 mg and continuing his clonazepam 0.5 mg PRN. Encouraged patient to reconnect with his local therapist, which he has agreed to do.     Identifying Information:  Adam [name] with a history of PH+ B cell ALL, major depressive disorder, PTSD, anxiety, and insomnia.??He has a long struggle with intermittent depressive episodes with one suicide attempt at age 72.??He was recently hospitalized for chemotherapy and was started on nortriptyline and clonazepam to manage his symptoms. Recuperating from DRESS and currently on a prednisone taper.??    Risk Assessment:  An assessment of suicide and violence risk factors was performed as part of this evaluation and is not significantly changed from the last visit.   While future psychiatric events cannot be accurately predicted, the patient does not currently require acute inpatient psychiatric care and does not currently meet [facility] involuntary commitment criteria.      Plan:    Problem 1:??Major Depressive Disorder  Status of problem:??chronic with moderate exacerbation  Interventions:??  ?? Increase nortriptyline from 50 mg to 75 mg at bedtime   ?? Monitor nortriptyline level given elevated creatinine levels. Will add on lab to his next lab appointment in a few weeks.  ?? Continue psychotherapy with local therapist. Patient states that he has been doing well and has not been meeting with his therapist recently. Encouraged patient to reconnect, which he has agreed to do.  ??  Problem 2:??Anxiety  Status of problem:??chronic with mild to moderate exacerbation  Interventions:??  ?? See nortriptyline plan above  ?? OK to continue clonazepam??0.5??mg tablets BID PRN. Patient states that he has only been using this medication at bedtime.  ??  Problem 3:??Insomnia  Status of problem:?? chronic with moderate exacerbation  Interventions:??  ?? See nortriptyline and clonazepam plans above  ?? Education re: sleep hygiene      Psychotherapy provided:  No billable psychotherapy service provided.    Patient has been given this writer's contact information as well as the [facility] Psychiatry urgent line number. The patient has been instructed to call 911 for emergencies.      Subjective:    Chief complaint:  Follow-up psychiatric evaluation for depression, anxiety, and sleep    Interval History:   Worsening depressive and anxiety symptoms reported along with worsening sleep. Patient sleeping during the day causing disruptive sleep at night. Tolerating the nortriptyline well without any adverse effects. Taking the clonazepam at bedtime. States that he wants to reconnect with his local therapist. He states that she has experience working with cancer patients and that his wife is also seeing her.    Prior Psychiatric Medication Trials: bupropion (could not tolerate), duloxetine, mirtazapine (given during recent hospitalization), lorazepam    Psychiatric Review of Systems  ??? Depressive Symptoms: anhedonia, depressed mood, difficulty concentrating, fatigue, hopelessness and insomnia  ??? Manic Symptoms: N/A  ??? Psychosis Symptoms: N/A  ??? Anxiety Symptoms: excessive anxiety and worry, difficulty controlling worry, restlessness, easily  fatigued, difficulty concentrating, irritability, muscle tension, difficulty staying asleep, restless, unsatisfying sleep   ??? Panic Symptoms: denies  ??? PTSD Symptoms: no recent issues  ??? Sleep disturbance: frequent night time awakening and non-restful sleep   ??? Appetite: Other: no change.  ??? Exercise: Considering a daily exercise routine.        Objective:      Mental Status Exam:  Appearance:    Appears stated age, Well nourished and Clean/Neat   Motor:   No abnormal movements   Speech/Language:    Normal rate, volume, tone, fluency   Mood:   Depressed   Affect:   Blunted, Calm, Cooperative and Depressed   Thought process and Associations:   Logical, linear, clear, coherent, goal directed   Abnormal/psychotic thought content:     Denies SI, HI, self harm, delusions, obsessions, paranoid ideation, or ideas of reference   Perceptual disturbances:     Denies auditory and visual hallucinations, behavior not concerning for response to internal stimuli     Other:   insight and judgment intact     I personally spent 30 minutes face-to-face and non-face-to-face in the care of this patient, which includes all pre, intra, and post visit time on the date of service.    Visit was completed by video (or phone) and the appropriate disclaimer has been included below.    The patient reports they are currently: not at home. He was currently in between clinic visits at the hospital. I spent 15 minutes on the real-time audio and video with the patient on the date of service. I spent an additional 15 minutes on pre- and post-visit activities on the date of service.     The patient was physically located in [place] or a state in which I am permitted to provide care. The patient and/or parent/guardian understood that s/he may incur co-pays and cost sharing, and agreed to the telemedicine visit. The visit was reasonable and appropriate under the circumstances given the patient's presentation at the time.    The patient and/or parent/guardian has been advised of the potential risks and limitations of this mode of treatment (including, but not limited to, the absence of in-person examination) and has agreed to be treated using telemedicine. The patient's/patient's family's questions regarding telemedicine have been answered.     If the visit was completed in an ambulatory setting, the patient and/or parent/guardian has also been advised to contact their provider???s office for worsening conditions, and seek emergency medical treatment and/or call 911 if the patient deems either necessary.      Sheran Lawless, PMHNP  10/27/2020

## 2020-10-27 NOTE — Unmapped (Addendum)
It was a pleasure meeting with you today. I'm so sorry that you have been feeling depressed and anxious. Here is our plan:    Increasing your nortriptyline dose from 50 mg to 75 mg. A new prescription for the 75 mg  dose has been sent to your pharmacy so you can start this new dose tonight.  Continue your clonazepam 0.5 mg at bedtime  We will meet again on 11/24/20 at 1:00.  Go ahead and connect with your local therapist since you have been struggling recently.    Please feel free to reach out with any questions or concerns.    Best,  [name]      Comprehensive Cancer Support Program (CCSP) - Psychiatry Outpatient Clinic   After Visit Summary    It was a pleasure to see you today in the [facility]???s Comprehensive Cancer Support Program (CCSP). The CCSP is a multidisciplinary program dedicated to helping patients, caregivers, and families with cancer treatment, recovery and survivorship.      To schedule, cancel, or change your appointment:  Please call the [facility] schedulers at [phone], Monday through Friday 8AM - 5PM.  Someone will return your call within 24 hours.      If you have a question about your medicines or you need to contact your provider:  Please call the CCSP program coordinator, Myrene Galas, at [phone].     For after hours urgent issues, you may call [phone] or call the I need to talk line at [phone]-TALK ([phone] anytime 24/7.    CCSP Patient and Family Resource Center: [phone].    CCSP Website:  [email or web address]    For prescription refills, please allow at least 24 hours (during business hours, M-F) for providers to call in refills to your pharmacy. We are generally unable to accommodate same-day requests for refills.     If you are taking any controlled substances (such as anxiety or sleep medications), you must use them as the directions say to use them. We generally do not provide early refills over the phone without clear reason, and it would be inappropriate to obtain the medications from other doctors. We routinely use the [place] controlled substance database to monitor prescription drug use.

## 2020-10-28 NOTE — Unmapped (Signed)
Patient arrived to infusion clinic ambulatory in no distress accompanied by family member. Patient to have Blina pump changed today. Port needle and dressing changed today at earlier lab appointment. Labs reviewed and within treatment parameters. New batteries placed in pump and volume reset. Patient dose verified and settings reviewed and verified with second nurse and verification pump running and all clams open prior to discharging patient to home. Patient left infusion clinic ambulatory in no distress accompanied by family member.

## 2020-10-29 ENCOUNTER — Ambulatory Visit: Admit: 2020-10-29 | Discharge: 2020-10-30 | Payer: MEDICAID

## 2020-10-29 NOTE — Unmapped (Signed)
[facility] Refill Coordination Note    Specialty Medication(s) to be Shipped:   Hematology/Oncology: Iclusig    Other medication(s) to be shipped: No additional medications requested for fill at this time     Adam Keith, DOB: May 07, 1980  Phone: [phone] (home)       All above HIPAA information was verified with patient.     Was a Nurse, learning disability used for this call? No    Completed refill call assessment today to schedule patient's medication shipment from the [facility] [phone]).  All relevant notes have been reviewed.     Specialty medication(s) and dose(s) confirmed: Regimen is correct and unchanged.   Changes to medications: Naszir reports no changes at this time.  Changes to insurance: No  New side effects reported not previously addressed with a pharmacist or physician: None reported  Questions for the pharmacist: No    Confirmed patient received a Conservation officer, historic buildings and a Surveyor, mining with first shipment. The patient will receive a drug information handout for each medication shipped and additional FDA Medication Guides as required.       DISEASE/MEDICATION-SPECIFIC INFORMATION        N/A    SPECIALTY MEDICATION ADHERENCE     Medication Adherence    Patient reported X missed doses in the last month: 0  Specialty Medication: Iclusig 30 mg  Patient is on additional specialty medications: No  Informant: patient              Were doses missed due to medication being on hold? No    Iclusig 30 mg: 14 days of medicine on hand       REFERRAL TO PHARMACIST     Referral to the pharmacist: Not needed      [facility]     Shipping address confirmed in Epic.     Delivery Scheduled: Yes, Expected medication delivery date: 11/11/20.     Medication will be delivered via UPS to the prescription address in Epic [place].    Adam Keith   [facility] Specialty Technician

## 2020-11-01 ENCOUNTER — Encounter
Admit: 2020-11-01 | Discharge: 2020-11-10 | Disposition: A | Discharge status: Home Health | Payer: MEDICAID | Attending: Anesthesiology | Admitting: Pulmonary Disease

## 2020-11-01 ENCOUNTER — Ambulatory Visit
Admit: 2020-11-01 | Discharge: 2020-11-10 | Disposition: A | Discharge status: Home Health | Payer: MEDICAID | Admitting: Pulmonary Disease

## 2020-11-01 ENCOUNTER — Encounter
Admit: 2020-11-01 | Discharge: 2020-11-10 | Disposition: A | Discharge status: Home Health | Payer: MEDICAID | Attending: Pulmonary Disease | Admitting: Pulmonary Disease

## 2020-11-01 LAB — CBC W/ AUTO DIFF
BASOPHILS ABSOLUTE COUNT: 0.2 10*9/L — ABNORMAL HIGH (ref 0.0–0.1)
BASOPHILS ABSOLUTE COUNT: 0.1 10*9/L (ref 0.0–0.1)
BASOPHILS RELATIVE PERCENT: 0.5 %
BASOPHILS RELATIVE PERCENT: 0.4 %
EOSINOPHILS ABSOLUTE COUNT: 0 10*9/L (ref 0.0–0.5)
EOSINOPHILS ABSOLUTE COUNT: 0 10*9/L (ref 0.0–0.5)
EOSINOPHILS RELATIVE PERCENT: 0 %
EOSINOPHILS RELATIVE PERCENT: 0.1 %
HEMATOCRIT: 46.4 % (ref 39.0–48.0)
HEMATOCRIT: 39.6 % (ref 39.0–48.0)
HEMOGLOBIN: 16.1 g/dL (ref 12.9–16.5)
HEMOGLOBIN: 13.6 g/dL (ref 12.9–16.5)
LYMPHOCYTES ABSOLUTE COUNT: 1.1 10*9/L (ref 1.1–3.6)
LYMPHOCYTES ABSOLUTE COUNT: 0.7 10*9/L — ABNORMAL LOW (ref 1.1–3.6)
LYMPHOCYTES RELATIVE PERCENT: 2.8 %
LYMPHOCYTES RELATIVE PERCENT: 2.5 %
MEAN CORPUSCULAR HEMOGLOBIN CONC: 34.7 g/dL (ref 32.0–36.0)
MEAN CORPUSCULAR HEMOGLOBIN CONC: 34.3 g/dL (ref 32.0–36.0)
MEAN CORPUSCULAR HEMOGLOBIN: 32.6 pg — ABNORMAL HIGH (ref 25.9–32.4)
MEAN CORPUSCULAR HEMOGLOBIN: 32.5 pg — ABNORMAL HIGH (ref 25.9–32.4)
MEAN CORPUSCULAR VOLUME: 94 fL (ref 77.6–95.7)
MEAN CORPUSCULAR VOLUME: 94.6 fL (ref 77.6–95.7)
MEAN PLATELET VOLUME: 8.4 fL (ref 6.8–10.7)
MEAN PLATELET VOLUME: 8.3 fL (ref 6.8–10.7)
MONOCYTES ABSOLUTE COUNT: 2.1 10*9/L — ABNORMAL HIGH (ref 0.3–0.8)
MONOCYTES ABSOLUTE COUNT: 0.7 10*9/L (ref 0.3–0.8)
MONOCYTES RELATIVE PERCENT: 5.5 %
MONOCYTES RELATIVE PERCENT: 2.7 %
NEUTROPHILS ABSOLUTE COUNT: 35.3 10*9/L — ABNORMAL HIGH (ref 1.8–7.8)
NEUTROPHILS ABSOLUTE COUNT: 24.2 10*9/L — ABNORMAL HIGH (ref 1.8–7.8)
NEUTROPHILS RELATIVE PERCENT: 91.2 %
NEUTROPHILS RELATIVE PERCENT: 94.3 %
PLATELET COUNT: 185 10*9/L (ref 150–450)
PLATELET COUNT: 198 10*9/L (ref 150–450)
RED BLOOD CELL COUNT: 4.94 10*12/L (ref 4.26–5.60)
RED BLOOD CELL COUNT: 4.19 10*12/L — ABNORMAL LOW (ref 4.26–5.60)
RED CELL DISTRIBUTION WIDTH: 16.3 % — ABNORMAL HIGH (ref 12.2–15.2)
RED CELL DISTRIBUTION WIDTH: 16.1 % — ABNORMAL HIGH (ref 12.2–15.2)
WBC ADJUSTED: 38.7 10*9/L — ABNORMAL HIGH (ref 3.6–11.2)
WBC ADJUSTED: 25.7 10*9/L — ABNORMAL HIGH (ref 3.6–11.2)

## 2020-11-01 LAB — COMPREHENSIVE METABOLIC PANEL
ALBUMIN: 3.8 g/dL (ref 3.4–5.0)
ALBUMIN: 3.2 g/dL — ABNORMAL LOW (ref 3.4–5.0)
ALKALINE PHOSPHATASE: 104 U/L (ref 46–116)
ALKALINE PHOSPHATASE: 93 U/L (ref 46–116)
ALT (SGPT): 61 U/L — ABNORMAL HIGH (ref 10–49)
ALT (SGPT): 48 U/L (ref 10–49)
ANION GAP: 9 mmol/L (ref 5–14)
ANION GAP: 8 mmol/L (ref 5–14)
AST (SGOT): 21 U/L (ref <=34)
AST (SGOT): 16 U/L (ref <=34)
BILIRUBIN TOTAL: 0.5 mg/dL (ref 0.3–1.2)
BILIRUBIN TOTAL: 0.3 mg/dL (ref 0.3–1.2)
BLOOD UREA NITROGEN: 27 mg/dL — ABNORMAL HIGH (ref 9–23)
BLOOD UREA NITROGEN: 22 mg/dL (ref 9–23)
BUN / CREAT RATIO: 11
BUN / CREAT RATIO: 9
CALCIUM: 9.8 mg/dL (ref 8.7–10.4)
CALCIUM: 8.9 mg/dL (ref 8.7–10.4)
CHLORIDE: 104 mmol/L (ref 98–107)
CHLORIDE: 102 mmol/L (ref 98–107)
CO2: 22 mmol/L (ref 20.0–31.0)
CO2: 22 mmol/L (ref 20.0–31.0)
CREATININE: 2.46 mg/dL — ABNORMAL HIGH
CREATININE: 2.4 mg/dL — ABNORMAL HIGH
EGFR CKD-EPI (2021) MALE: 33 mL/min/{1.73_m2} — ABNORMAL LOW (ref >=60)
EGFR CKD-EPI (2021) MALE: 34 mL/min/{1.73_m2} — ABNORMAL LOW (ref >=60)
GLUCOSE RANDOM: 108 mg/dL (ref 70–179)
GLUCOSE RANDOM: 104 mg/dL — ABNORMAL HIGH (ref 70–99)
POTASSIUM: 4.4 mmol/L (ref 3.4–4.8)
POTASSIUM: 4.4 mmol/L (ref 3.4–4.8)
PROTEIN TOTAL: 7.4 g/dL (ref 5.7–8.2)
PROTEIN TOTAL: 6.4 g/dL (ref 5.7–8.2)
SODIUM: 135 mmol/L (ref 135–145)
SODIUM: 132 mmol/L — ABNORMAL LOW (ref 135–145)

## 2020-11-01 LAB — BLOOD GAS CRITICAL CARE PANEL, ARTERIAL
BASE EXCESS ARTERIAL: -6.2 — ABNORMAL LOW (ref -2.0–2.0)
BASE EXCESS ARTERIAL: -8.2 — ABNORMAL LOW (ref -2.0–2.0)
CALCIUM IONIZED ARTERIAL (MG/DL): 4.46 mg/dL (ref 4.40–5.40)
CALCIUM IONIZED ARTERIAL (MG/DL): 4.17 mg/dL — ABNORMAL LOW (ref 4.40–5.40)
GLUCOSE WHOLE BLOOD: 113 mg/dL (ref 70–179)
GLUCOSE WHOLE BLOOD: 105 mg/dL (ref 70–179)
HCO3 ARTERIAL: 19 mmol/L — ABNORMAL LOW (ref 22–27)
HCO3 ARTERIAL: 17 mmol/L — ABNORMAL LOW (ref 22–27)
HEMOGLOBIN BLOOD GAS: 16.4 g/dL
HEMOGLOBIN BLOOD GAS: 10.3 g/dL — ABNORMAL LOW
LACTATE BLOOD ARTERIAL: 1.1 mmol/L (ref <1.3)
LACTATE BLOOD ARTERIAL: 1.2 mmol/L (ref <1.3)
O2 SATURATION ARTERIAL: 94.5 % (ref 94.0–100.0)
O2 SATURATION ARTERIAL: 96 % (ref 94.0–100.0)
PCO2 ARTERIAL: 38.2 mmHg (ref 35.0–45.0)
PCO2 ARTERIAL: 33.9 mmHg — ABNORMAL LOW (ref 35.0–45.0)
PH ARTERIAL: 7.31 — ABNORMAL LOW (ref 7.35–7.45)
PH ARTERIAL: 7.32 — ABNORMAL LOW (ref 7.35–7.45)
PO2 ARTERIAL: 71.6 mmHg — ABNORMAL LOW (ref 80.0–110.0)
PO2 ARTERIAL: 81.7 mmHg (ref 80.0–110.0)
POTASSIUM WHOLE BLOOD: 4.5 mmol/L (ref 3.4–4.6)
POTASSIUM WHOLE BLOOD: 3.9 mmol/L (ref 3.4–4.6)
SODIUM WHOLE BLOOD: 134 mmol/L — ABNORMAL LOW (ref 135–145)
SODIUM WHOLE BLOOD: 132 mmol/L — ABNORMAL LOW (ref 135–145)

## 2020-11-01 LAB — BLOOD GAS, VENOUS
BASE EXCESS VENOUS: -3.3 — ABNORMAL LOW (ref -2.0–2.0)
HCO3 VENOUS: 23 mmol/L (ref 22–27)
O2 SATURATION VENOUS: 96.2 % — ABNORMAL HIGH (ref 40.0–85.0)
PCO2 VENOUS: 43 mmHg (ref 40–60)
PH VENOUS: 7.33 (ref 7.32–7.43)
PO2 VENOUS: 84 mmHg — ABNORMAL HIGH (ref 30–55)

## 2020-11-01 LAB — PROTIME-INR
INR: 1.19
PROTIME: 13.9 s — ABNORMAL HIGH (ref 10.3–13.4)

## 2020-11-01 LAB — BASIC METABOLIC PANEL
ANION GAP: 11 mmol/L (ref 5–14)
ANION GAP: 6 mmol/L (ref 5–14)
BLOOD UREA NITROGEN: 21 mg/dL (ref 9–23)
BLOOD UREA NITROGEN: 36 mg/dL — ABNORMAL HIGH (ref 9–23)
BUN / CREAT RATIO: 9
BUN / CREAT RATIO: 15
CALCIUM: 9.8 mg/dL (ref 8.7–10.4)
CALCIUM: 7.5 mg/dL — ABNORMAL LOW (ref 8.7–10.4)
CHLORIDE: 104 mmol/L (ref 98–107)
CHLORIDE: 109 mmol/L — ABNORMAL HIGH (ref 98–107)
CO2: 18 mmol/L — ABNORMAL LOW (ref 20.0–31.0)
CO2: 19 mmol/L — ABNORMAL LOW (ref 20.0–31.0)
CREATININE: 2.24 mg/dL — ABNORMAL HIGH
CREATININE: 2.44 mg/dL — ABNORMAL HIGH
EGFR CKD-EPI (2021) MALE: 37 mL/min/{1.73_m2} — ABNORMAL LOW (ref >=60)
EGFR CKD-EPI (2021) MALE: 33 mL/min/{1.73_m2} — ABNORMAL LOW (ref >=60)
GLUCOSE RANDOM: 94 mg/dL (ref 70–179)
GLUCOSE RANDOM: 110 mg/dL (ref 70–179)
POTASSIUM: 4.8 mmol/L (ref 3.5–5.1)
SODIUM: 133 mmol/L — ABNORMAL LOW (ref 135–145)
SODIUM: 134 mmol/L — ABNORMAL LOW (ref 135–145)

## 2020-11-01 LAB — HIGH SENSITIVITY TROPONIN I - 6 HOUR SERIAL
HIGH SENSITIVITY TROPONIN - DELTA (2-6H): 1 ng/L (ref <=7)
HIGH-SENSITIVITY TROPONIN I - 6 HOUR: 16 ng/L (ref <=53)

## 2020-11-01 LAB — IGA: GAMMAGLOBULIN; IGA: <15 mg/dL — ABNORMAL LOW (ref 70.0–400.0)

## 2020-11-01 LAB — MAGNESIUM
MAGNESIUM: 1.7 mg/dL (ref 1.6–2.6)
MAGNESIUM: 1.6 mg/dL (ref 1.6–2.6)
MAGNESIUM: 1.3 mg/dL — ABNORMAL LOW (ref 1.6–2.6)

## 2020-11-01 LAB — D-DIMER, QUANTITATIVE: D-DIMER QUANTITATIVE (CW,ML,HL,HS,CH): 770 ng{FEU}/mL — ABNORMAL HIGH (ref <=500)

## 2020-11-01 LAB — SLIDE REVIEW

## 2020-11-01 LAB — HIGH SENSITIVITY TROPONIN I - 2 HOUR SERIAL
HIGH SENSITIVITY TROPONIN - DELTA (0-2H): 2 ng/L (ref <=7)
HIGH-SENSITIVITY TROPONIN I - 2 HOUR: 17 ng/L (ref <=53)

## 2020-11-01 LAB — PHOSPHORUS
PHOSPHORUS: 5.1 mg/dL (ref 2.4–5.1)
PHOSPHORUS: 5.4 mg/dL — ABNORMAL HIGH (ref 2.4–5.1)
PHOSPHORUS: 4.8 mg/dL (ref 2.4–5.1)

## 2020-11-01 LAB — IGG: GAMMAGLOBULIN; IGG: 266 mg/dL — ABNORMAL LOW (ref 646–2013)

## 2020-11-01 LAB — IGM: GAMMAGLOBULIN; IGM: <8 mg/dL — ABNORMAL LOW (ref 40–230)

## 2020-11-01 LAB — LACTATE, VENOUS, WHOLE BLOOD: LACTATE BLOOD VENOUS: 2.2 mmol/L — ABNORMAL HIGH (ref 0.5–1.8)

## 2020-11-01 LAB — LACTATE SEPSIS, VENOUS: LACTATE BLOOD VENOUS: 1.2 mmol/L (ref 0.5–1.8)

## 2020-11-01 LAB — HIGH SENSITIVITY TROPONIN I - SERIAL: HIGH SENSITIVITY TROPONIN I: 19 ng/L (ref <=53)

## 2020-11-01 LAB — POTASSIUM: POTASSIUM: 4.4 mmol/L (ref 3.4–4.8)

## 2020-11-01 MED ADMIN — midazolam (VERSED) injection 6 mg: 6 mg | INTRAVENOUS | @ 18:00:00 | Stop: 2020-11-01

## 2020-11-01 MED ADMIN — isavuconazonium sulfate (CRESEMBA) capsule 372 mg: 372 mg | ORAL | @ 13:00:00

## 2020-11-01 MED ADMIN — lactated ringers bolus 1,000 mL: 1000 mL | INTRAVENOUS | @ 14:00:00 | Stop: 2020-11-01

## 2020-11-01 MED ADMIN — HYDROmorphone (PF) (DILAUDID) 1 mg/mL injection: INTRAVENOUS | @ 14:00:00 | Stop: 2020-11-01

## 2020-11-01 MED ADMIN — linezolid in dextrose 5% (ZYVOX) 600 mg/300 mL IVPB 600 mg: 600 mg | INTRAVENOUS | @ 13:00:00 | Stop: 2020-11-11

## 2020-11-01 MED ADMIN — phenylephrine 0.8 mg/10 mL (80 mcg/mL) injection: INTRAVENOUS | @ 18:00:00 | Stop: 2020-11-01

## 2020-11-01 MED ADMIN — vancomycin (VANCOCIN) 2000 mg IVPB in 500 mL sodium chloride 0.9% (premix): 2000 mg | INTRAVENOUS | @ 08:00:00 | Stop: 2020-11-01

## 2020-11-01 MED ADMIN — meropenem (MERREM) 500 mg in sodium chloride 0.9 % (NS) 100 mL IVPB-connector bag: 500 mg | INTRAVENOUS | @ 10:00:00 | Stop: 2020-11-01

## 2020-11-01 MED ADMIN — melatonin tablet 3 mg: 3 mg | GASTROENTERAL | @ 22:00:00

## 2020-11-01 MED ADMIN — norepinephrine 8 mg in dextrose 5 % 250 mL (32 mcg/mL) infusion PMB: 0-30 ug/min | INTRAVENOUS | @ 20:00:00

## 2020-11-01 MED ADMIN — propofol (DIPRIVAN) infusion 10 mg/mL: 0-80 ug/kg/min | INTRAVENOUS | @ 20:00:00

## 2020-11-01 MED ADMIN — azithromycin (ZITHROMAX) 500 mg in sodium chloride (NS) 0.9 % 250 mL IVPB: 500 mg | INTRAVENOUS | @ 16:00:00 | Stop: 2020-11-08

## 2020-11-01 MED ADMIN — HYDROmorphone (PF) (DILAUDID) injection 0.5 mg: 0.5 mg | INTRAVENOUS | @ 14:00:00 | Stop: 2020-11-01

## 2020-11-01 MED ADMIN — MORPhine 4 mg/mL injection 4 mg: 4 mg | INTRAVENOUS | @ 08:00:00 | Stop: 2020-11-01

## 2020-11-01 MED ADMIN — propofol (DIPRIVAN) infusion 10 mg/mL: 0-80 ug/kg/min | INTRAVENOUS | @ 21:00:00

## 2020-11-01 MED ADMIN — heparin (porcine) 5,000 unit/mL injection 5,000 Units: 5000 [IU] | SUBCUTANEOUS | @ 20:00:00

## 2020-11-01 MED ADMIN — lactated ringers bolus 1,000 mL: 1000 mL | INTRAVENOUS | @ 12:00:00 | Stop: 2020-11-01

## 2020-11-01 MED ADMIN — oxyCODONE (ROXICODONE) immediate release tablet 10 mg: 10 mg | GASTROENTERAL | @ 21:00:00 | Stop: 2020-11-15

## 2020-11-01 MED ADMIN — fentaNYL PF (SUBLIMAZE) (50 mcg/mL) infusion (bag): 0-200 ug/h | INTRAVENOUS | @ 20:00:00 | Stop: 2020-11-15

## 2020-11-01 MED ADMIN — lactated ringers bolus 1,000 mL: 1000 mL | INTRAVENOUS | @ 06:00:00 | Stop: 2020-11-01

## 2020-11-01 MED ADMIN — aztreonam (AZACTAM) 2 g in sodium chloride 0.9 % (NS) 100 mL IVPB-connector bag: 2 g | INTRAVENOUS | @ 06:00:00 | Stop: 2020-11-01

## 2020-11-01 MED ADMIN — succinylcholine (ANECTINE) injection: INTRAVENOUS | @ 18:00:00 | Stop: 2020-11-01

## 2020-11-01 MED ADMIN — cefepime (MAXIPIME) 2 g in sodium chloride 0.9 % (NS) 100 mL IVPB-connector bag: 2 g | INTRAVENOUS | @ 15:00:00 | Stop: 2020-11-08

## 2020-11-01 MED ADMIN — fluconazole (DIFLUCAN) 2 mg/mL in sodium chloride 0.9% 400 mg: 400 mg | INTRAVENOUS | @ 06:00:00 | Stop: 2020-11-01

## 2020-11-01 MED ADMIN — valACYclovir (VALTREX) tablet 500 mg: 500 mg | ORAL | @ 13:00:00 | Stop: 2020-11-01

## 2020-11-01 MED ADMIN — propofoL (DIPRIVAN) injection: INTRAVENOUS | @ 18:00:00 | Stop: 2020-11-01

## 2020-11-01 MED ADMIN — DAPTOmycin (CUBICIN) 1,150 mg in sodium chloride (NS) 0.9 % 50 mL IVPB: 10 mg/kg | INTRAVENOUS | @ 21:00:00 | Stop: 2020-11-15

## 2020-11-01 MED ADMIN — HYDROmorphone (DILAUDID) tablet 2 mg: 2 mg | ORAL | @ 16:00:00 | Stop: 2020-11-01

## 2020-11-01 MED ADMIN — aspirin chewable tablet 81 mg: 81 mg | ORAL | @ 13:00:00 | Stop: 2020-11-01

## 2020-11-01 MED ADMIN — fentaNYL (PF) (SUBLIMAZE) injection 100 mcg: 100 ug | INTRAVENOUS | @ 18:00:00 | Stop: 2020-11-01

## 2020-11-01 MED ADMIN — clonazePAM (KlonoPIN) tablet 0.5 mg: .5 mg | ORAL | @ 11:00:00 | Stop: 2020-11-01

## 2020-11-01 MED ADMIN — acetaminophen (TYLENOL) tablet 1,000 mg: 1000 mg | ORAL | @ 05:00:00 | Stop: 2020-11-01

## 2020-11-01 MED ADMIN — HYDROmorphone (PF) (DILAUDID) 1 mg/mL injection: INTRAVENOUS | @ 13:00:00 | Stop: 2020-11-01

## 2020-11-01 MED ADMIN — HYDROmorphone (DILAUDID) tablet 2 mg: 2 mg | ORAL | @ 11:00:00 | Stop: 2020-11-01

## 2020-11-01 MED ADMIN — HYDROmorphone (PF) (DILAUDID) injection 0.5 mg: 0.5 mg | INTRAVENOUS | @ 06:00:00 | Stop: 2020-11-01

## 2020-11-01 MED ADMIN — gabapentin (NEURONTIN) capsule 300 mg: 300 mg | ORAL | @ 13:00:00 | Stop: 2020-11-01

## 2020-11-01 MED ADMIN — HYDROmorphone (PF) (DILAUDID) injection 0.5 mg: 0.5 mg | INTRAVENOUS | @ 17:00:00 | Stop: 2020-11-01

## 2020-11-01 MED ADMIN — albuterol 2.5 mg /3 mL (0.083 %) nebulizer solution 5 mg: 5 mg | RESPIRATORY_TRACT | @ 08:00:00 | Stop: 2020-11-01

## 2020-11-01 MED ADMIN — ipratropium-albuteroL (DUO-NEB) 0.5-2.5 mg/3 mL nebulizer solution 3 mL: 3 mL | RESPIRATORY_TRACT | @ 13:00:00

## 2020-11-01 MED ADMIN — ipratropium (ATROVENT) 0.02 % nebulizer solution 500 mcg: 500 ug | RESPIRATORY_TRACT | @ 08:00:00 | Stop: 2020-11-01

## 2020-11-01 MED ADMIN — acetaminophen (TYLENOL) tablet 1,000 mg: 1000 mg | GASTROENTERAL | @ 20:00:00 | Stop: 2020-12-01

## 2020-11-01 MED ADMIN — midazolam (VERSED) 1 mg/mL injection: @ 18:00:00 | Stop: 2020-11-01

## 2020-11-01 MED ADMIN — HYDROmorphone (PF) (DILAUDID) injection 0.5 mg: 0.5 mg | INTRAVENOUS | @ 13:00:00 | Stop: 2020-11-01

## 2020-11-01 MED ADMIN — heparin (porcine) 5,000 unit/mL injection 5,000 Units: 5000 [IU] | SUBCUTANEOUS | @ 10:00:00

## 2020-11-01 MED ADMIN — oxyCODONE (OxyCONTIN) 12 hr crush resistant ER/CR tablet 10 mg: 10 mg | ORAL | @ 13:00:00 | Stop: 2020-11-01

## 2020-11-01 NOTE — Unmapped (Signed)
[facility]  Emergency Department Medical Screening Examination     Subjective     Adam 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 The patient reports 2 days of sharp R rib pain with associated dyspnea, cough, and chills. No recent trauma or falls. He reports an episode of dry heaving last night and endorses PO intolerance 2/2 pain. No histotu of similar episodes, although he notes that he sometimes feels ill during his infusions. No recent positive COVID test. No known sick contacts. No history of DVT or PE. Denies fevers.     Per chart review, patient has a history of neutropenia, MRSA, and sepsis.     Abbreviated Review of Systems/Covid Screen  Constitutional: Negative for fever  Respiratory: Positive for cough. Positive for difficulty breathing.    Objective     ED Triage Vitals [10/31/20 2330]   Enc Vitals Group      BP 119/77      Heart Rate 114      SpO2 Pulse 114      Resp 20      Temp 37.3 ??C (99.1 ??F)      Temp Source Oral      SpO2 94 %      Weight (!) 113.4 kg (250 lb)      Height 1.88 m (6' 2)        Focused Physical Exam  Constitutional: Patient is ill-appearing and uncomfortable in triage.   Respiratory: Non-labored respirations.  Neurological: Clear speech. No gross focal neurologic deficits are appreciated.    Assessment & Plan     At time of triage, concern for sepsis- pneumonia vs viral etiology. On current infusion and with hx of neutropenia. Plan for CXR, lactate, blood cultures, mag, phos, CBC, and BMP.     A medical screening exam has been performed. At the time of this evaluation, no emergency medical condition requiring immediate stabilization has been identified nor is there suspicion for imminent decompensation. Appropriate triage protocols will be implemented and a comprehensive ED evaluation with disposition will be completed by a healthcare provider when an appropriate ED location becomes available. The patient is aware that this is an initial encounter only and verbalizes understanding and agreement with the plan.     Emergency Department operations continue to be impacted by the COVID-19 pandemic.     Rusty Aus  October 31, 2020 11:27 PM    Documentation assistance was provided by Everlene Balls, on October 31, 2020 at 11:27 PM for Jiles Crocker, MD.      Documentation assistance was provided by the scribe in my presence.  The documentation recorded by the scribe has been reviewed by me and accurately reflects the services I personally performed.

## 2020-11-01 NOTE — Unmapped (Signed)
MICU History & Physical     Date of Service: 11/01/2020    Problem List:   Principal Problem:    Pneumonia  Active Problems:    Tobacco use disorder    AKI (acute kidney injury) (CMS-HCC)    Recurrent major depressive disorder, in partial remission (CMS-HCC)    Hypogammaglobulinemia (CMS-HCC)    ALL (acute lymphoblastic leukemia) (CMS-HCC)    Respiratory failure with hypoxia (CMS-HCC)  Resolved Problems:    * No resolved hospital problems. *      HPI: Adam 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    History provided by patient and wife. He was in his normal state of health until 10/29/20 when he started developing worsening SOB, productive cough and chills. Coughing spells were intermittently associated with he denies sick contacts, fever, diarrhea, dysuria, polyuria, aspiration or focalizing pain. Pt endorses taking all his medicines as prescribed including antifungal and ppx meds.     Of not pt hospitalized 08/2020 for induction therapy that was complicated by Septic Shock due to MRSA and Candida krusei fungemia, acute renal failure necessitating temporary iHD and AHRB requiring intubation and stay in MICU    Pt endorses being full code status and his decision maker is his wife as listed in epic.     Neurological   Chronic Pain, Anxiety Followed by Palliative care  -- continue home oxycontin 15mg  bid  -- continue home dilaudid 2mg  po q4 prn mod sev pain  -- continue home klonipin 0.5mg  bid prn anxiety    Depression  -- Continue home nortriptyline    Pulmonary   AHRF 2/2 Pneumonia with History of Intubation  Arrived in ed needing bcpapplacement 70%. Duonebs given 2/2 history of copd and wheezing RLL in area of consolidation. Pt down to 65% with transition to Bipap 10/5.  -- Continuous pulse ox  -- Continue bipap, sat >88%  -- duonebs scheduled q 6  -- may benefit from hypertonics for airway clearance    OSA  Pt with known sleep apnea on sleep study. Does not use cpap.  -- Cpap at night while inpatient  Cardiovascular   Shortness of Breath with ACS Risk factors including Hyperlididemia, Active smoking and obesity  The 10-year ASCVD risk score Denman George DC Jr., et al., 2013) is: 3.7% LDS 127. Not on statin.ECG in ed with some concern for 1mm st changes in contiguous leadsV3-5. Will repeat ecg and check troponin. Pt denies chest pain.  --ecg  --trend troponin     Renal   AKI on CKD  Baseline Cr around 1.8. AKI to 2.4 most likely pre-renal in setting of NV from pneumonia and illness.  -- trend I/O  -- 1 L LR  -- Daily BMP, Mg, Ph  Infectious Disease/Autoimmune   Pneumonia l PPX  48 hours of chills, sob and productive cough with cxr cf rll pneumonia and leukocytosis. Most likely bacterial infectyion as pt on ppx  With cresemba, bactrim and valtrex. Initial lactate 2.2 down to 1.2 with IVF resusitation. Will start broad abx, continue bipap as above and complete infectious wu with urine and sputum samples. Will consult ICID for antibiiotic assistance. Will stop vancomycin given hx of hearing loss and not utilize cephalosporins given hx dress to ceftaroline.  Cultures:  No results found for: BLOOD CULTURE, URINE CULTURE, LOWER RESPIRATORY  CULTURE  WBC (10*9/L)   Date Value   11/01/2020 38.7 (H)      -- Stop vancomycin  -- Start Meropenem (11/01/20 -)  -- Start Linezolid (11/01/20- )  -- Continue Home crescemba  -- Continue home bactrim, ss MWF  -- continue home valtrex 500mg  daily  -- Consult ICID   Sepsis Protocol Documentation:     Note Type:  ED Initial Sepsis Exam    Patient's Weight:  (!) 113.4 kg (250 lb), Ideal body weight: 82.2 kg (181 lb 3.5 oz), Body mass index is 32.1 kg/m??.    Pre-Arrival Fluid Volume:  No data recorded    Did the patient have pre-arrival hypotension (SBP <90 and/or MAP <65)?      Were there 2 or more episodes of hypotension within the last 3 hours?          Initial:     Time Infection Was Suspected or Identified:  11/01/2020 5:00 AM   Fluid Requirements Assessment:      Has the patient had 2 hypotensive episodes in the last 3 hours:  No    Has the patient had a lactate > or = 4 in the last 3 hours:  No  The following findings which occurred in the prior six hours (including pre-hospital and ED, if applicable) are either normal for the patient, secondary to chronic condition, secondary to an acute condition due to a non-infectious process, secondary to medication effect, or reflective of an error/erroneous entry, and do not reflect SIRS or acute end-organ dysfunction or persistent hypotension from sepsis:  RR >20, WBC > 12K or <4 or Bands > 10%, Cr > 2 and Acute respiratory failure = Need for invasive or non-invasive mechanical ventilation        History of Dress  Hx of dress on cephalosporins was on prednisone taper in may 2022 now off.  -- monitor for dress while undergoing trx with abx  FEN/GI   NAI     Malnutrition Assessment: Not done yet.  Body mass index is 32.1 kg/m??.               Heme/Coag   Ph+ B-ALL  Followed by [facility] [name] on  C3D27 ponatinib-binatumomab currently running infusion. Will consult oncology for mgmt of chemotherapy/immunotherapy while inpatient.  -- Consult Hem/Onc regarding chemotherapy/immunotherapy while having active infection.   -- consintue asa 81mg  daily while on ponatinib per onc recs  Hypogammaglobulinemia  Recieves IVIG q 6 weeks. Will check Ig levels as may benefit from IVIG in setting of infection.  --IgM, IgA, IgG  Endocrine   NAI    Integumentary   NAI    Prophylaxis/LDA/Restraints/Consults   Can CVC be removed? No: need for medications requiring central access (e.g. pressors)   Can A-line be removed? N/A, no A-line present  Can Foley be removed? N/A, no Foley present  Mobility plan: Step 4 - Out of bed assessment (sit at bedside)    Feeding: NPO for procedure  Analgesia: No pain issues  Sedation SAT/SBT: N/A  Thromboembolic ppx: SQ heparin  Head of bed >30 degrees: Yes  Ulcer ppx: Not indicated  Glucose within target range: Yes, in range    Does patient need/have an active type/screen? Yes    RASS at goal? N/A, not on sedation  [place] Agitation Assessment Scale (RASS) : Anxious, apprehensive , but not aggressive (06/23/2020  8:12 PM)     Can antipsychotics be stopped? N/A, not on antipsychotics  CAM-ICU Result: CAM-ICU Negative (  02/24/2020  8:30 PM)      Would hospice care be appropriate for this patient? No, patient improving or expected to improve    Patient Lines/Drains/Airways Status     Active Active Lines, Drains, & Airways     Name Placement date Placement time Site Days    Power Port--a-Cath Single Hub 03/10/20 Right Internal jugular 03/10/20  0935  Internal jugular  235    Peripheral IV 11/01/20 Right Forearm 11/01/20  0135  Forearm  less than 1    Peripheral IV 11/01/20 Left Forearm 11/01/20  0330  Forearm  less than 1              Patient Lines/Drains/Airways Status     Active Wounds     Name Placement date Placement time Site Days    Wound 01/30/20 Pressure Injury Coccyx Mid Stage 3 01/30/20  --  Coccyx  276                Goals of Care     Code Status: Full Code    Designated Healthcare Decision Maker:  Mr. Maule current decisional capacity for healthcare decision-making is Full capacity. His designated Educational psychologist) is/are   HCDM (patient stated preference): Byrom, [name] - Spouse - [phone].      Subjective     HPI:  See above    ROS: Ten-point review of systems is obtained and is negative except as noted in HPI.    Allergies  Allergies   Allergen Reactions   ??? Bupropion Hcl Other (See Comments)     Per patient out of touch with reality, suicidal, homicidal   ??? 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 Onion Anaphylaxis   ??? Vancomycin Analogues      Hearing loss with Lasix  Other reaction(s): Other (See Comments)  Hearing loss  lasix   ??? Bismuth Subsalicylate Nausea And Vomiting   ??? Ceftaroline Fosamil Rash and Other (See Comments)     Rash X 2 and pancytopenia 02/2020   ??? Furosemide      With Vancomycin caused hearing loss  Other reaction(s): Other (See Comments)  With Vancomycin caused hearing loss       Meds  No current facility-administered medications on file prior to encounter.     Current Outpatient Medications on File Prior to Encounter   Medication Sig   ??? acetaminophen (TYLENOL) 325 MG tablet Take 650 mg by mouth every six (6) hours as needed for pain.    ??? amLODIPine (NORVASC) 10 MG tablet Take 1 tablet (10 mg total) by mouth daily.   ??? aspirin 81 MG chewable tablet Chew 1 tablet (81 mg total) daily.   ??? carvediloL (COREG) 12.5 MG tablet TAKE 1 TABLET BY MOUTH TWO TIMES A DAY.   ??? clonazePAM (KLONOPIN) 0.5 MG tablet Take 1 tablet (0.5 mg total) by mouth two (2) times a day as needed for anxiety or sleep.   ??? gabapentin (NEURONTIN) 300 MG capsule Take 1 capsule (300 mg total) by mouth Three (3) times a day.   ??? hydrOXYzine (ATARAX) 25 MG tablet Take 1 tablet (25 mg total) by mouth every six (6) hours as needed.   ??? isavuconazonium sulfate (CRESEMBA) 186 mg cap capsule Take 2 capsules (372 mg total) by mouth daily.   ??? oxyCODONE (OXYCONTIN) 15 mg 12 hr crush resistant ER/CR tablet Take 1 tablet (15 mg total) by mouth two (2) times a day  for 28 days.   ??? OXYCONTIN 15 mg 12 hr crush resistant ER/CR tablet TAKE 1 TABLET (15 MG TOTAL) BY MOUTH TWO (2) TIMES A DAY FOR 28 DAYS.   ??? PONATinib (ICLUSIG) 30 mg tablet Take 1 tablet (30 mg total) by mouth daily. Swallow tablets whole. Do not crush, break, cut or chew tablets.   ??? prochlorperazine (COMPAZINE) 10 MG tablet Take 1 tablet (10 mg total) by mouth every six (6) hours as needed for nausea (if no relief from zofran (ondansetron)).   ??? sulfamethoxazole-trimethoprim (BACTRIM DS) 800-160 mg per tablet Take 1 tablet (160 mg of trimethoprim total) by mouth 2 times a day on Saturday, Sunday. For prophylaxis while on chemo.   ??? valACYclovir (VALTREX) 500 MG tablet Take 1 tablet (500 mg total) by mouth daily.   ??? nortriptyline (PAMELOR) 75 MG capsule Take 1 capsule (75 mg total) by mouth nightly.       Past Medical History  Past Medical History:   Diagnosis Date   ??? 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 BONE MARROW BIOPSY & ASPIRATION  01/21/2020        ??? IR INSERT PORT AGE GREATER THAN 5 YRS  03/10/2020    IR INSERT PORT AGE GREATER THAN 5 YRS 03/10/2020 [name], MD IMG VIR H&V [facility]   ??? PR BRONCHOSCOPY,DIAGNOSTIC W LAVAGE Bilateral 07/20/2020    Procedure: BRONCHOSCOPY, RIGID OR FLEXIBLE, INCLUDE FLUOROSCOPIC GUIDANCE WHEN PERFORMED; W/BRONCHIAL ALVEOLAR LAVAGE WITH MODERATE SEDATION;  Surgeon: Dellis Filbert, MD;  Location: BRONCH PROCEDURE LAB [facility];  Service: Pulmonary       Family History  Family History   Problem Relation Age of Onset   ??? Melanoma Neg Hx    ??? Basal cell carcinoma Neg Hx    ??? Squamous cell carcinoma Neg Hx        Social History  Social History     Socioeconomic History   ??? Marital status: Married   Tobacco Use   ??? Smoking status: Former Smoker     Packs/day: 2.00     Types: Cigarettes     Quit date: 01/20/2020     Years since quitting: 0.7   ??? Smokeless tobacco: Former Neurosurgeon     Types: Musician Use   ??? Vaping Use: Never used   Other Topics Concern   ??? Do you use sunscreen? No   ??? Tanning bed use? No   ??? Are you easily burned? Yes   ??? Excessive sun exposure? No   ??? Blistering sunburns? Yes     Social Determinants of Health     Financial Resource Strain: Low Risk    ??? Difficulty of Paying Living Expenses: Not hard at all   Food Insecurity: No Food Insecurity   ??? Worried About Programme researcher, broadcasting/film/video in the Last Year: Never true   ??? Ran Out of Food in the Last Year: Never true   Transportation Needs: No Transportation Needs   ??? Lack of Transportation (Medical): No   ??? Lack of Transportation (Non-Medical): No           Objective     Vitals - past 24 hours  Temp:  [36.6 ??C (97.8 ??F)-37.3 ??C (99.1 ??F)] 36.6 ??C (97.8 ??F)  Heart Rate:  [85-114] 90  SpO2 Pulse:  [85-114] 89  Resp:  [  16-27] 24  BP: (118-156)/(73-114) 126/76  FiO2 (%):  [65 %-75 %] 65 %  SpO2:  [91 %-100 %] 93 % Intake/Output  No intake/output data recorded.     Physical Exam:    General: Fatigued, NAD, Answers questions appropriately  HEENT: Bipap on face, EOMI, MOM  CV: Soft S1,S2, no murmurs, no le edema  Pulm: Diminished lung sounds RLL with Expiratory wheezes and crackles RLL  GI:  NTND, BS+ no rebound or guarding  MSK: normal muslce tone  Skin: no rashes   Neuro: No focal deficits    The patient's hospital stay has been complicated by the following clinically significant conditions requiring additional evaluation and treatment or having a significant effect of this patient's care: - Chronic kidney disease POA requiring further investigation, treatment, or monitoring    Body mass index is 32.1 kg/m??.            Wt Readings from Last 12 Encounters:   10/31/20 (!) 113.4 kg (250 lb)   10/27/20 (!) 114.3 kg (251 lb 15.8 oz)   10/20/20 (!) 114.4 kg (252 lb 4.8 oz)   10/13/20 (!) 111.5 kg (245 lb 14.4 oz)   10/13/20 (!) 111.5 kg (245 lb 14.4 oz)   10/06/20 (!) 110.1 kg (242 lb 11.2 oz)   09/21/20 (!) 112.9 kg (248 lb 14.4 oz)   09/21/20 (!) 112.9 kg (248 lb 14.4 oz)   09/18/20 (!) 112.4 kg (247 lb 12.8 oz)   09/09/20 (!) 112.8 kg (248 lb 11.2 oz)   08/31/20 (!) 113 kg (249 lb 1.9 oz)   08/17/20 (!) 113.9 kg (251 lb 1.7 oz)       Continuous Infusions:       Scheduled Medications:   ??? albuterol  5 mg Nebulization Once   ??? aspirin  81 mg Oral Daily   ??? gabapentin  300 mg Oral TID   ??? heparin (porcine) for subcutaneous use  5,000 Units Subcutaneous Q8H [facility]   ??? ipratropium-albuteroL  3 mL Nebulization Q6H (RT)   ??? isavuconazonium sulfate  372 mg Oral Daily   ??? linezolid  600 mg Intravenous Q12H [facility]   ??? meropenem  500 mg Intravenous Q6H   ??? nortriptyline  75 mg Oral Nightly   ??? oxyCODONE  10 mg Oral BID   ??? senna  2 tablet Oral Nightly   ??? [START ON 11/02/2020] sulfamethoxazole-trimethoprim  1 tablet Oral Once per day on Mon Wed Fri   ??? valACYclovir  500 mg Oral Daily       PRN medications:  acetaminophen, clonazePAM, hydrOXYzine, melatonin, polyethylene glycol, prochlorperazine    Data/Imaging Review: Reviewed in Epic and personally interpreted on 11/01/2020. See EMR for detailed results.

## 2020-11-01 NOTE — Unmapped (Signed)
Arterial Line Insertion Procedure Note     Date of Service: 11/01/2020    Patient Name:: Adam Keith  Patient MRN: [id number]    Indications: Respiratory failure    Procedure Details:   Informed consent verbally was obtained after explanation of the risks and benefits of the procedure, refer to the consent documentation.    Time-out was performed immediately prior to the procedure to verify correct patient, procedure, site, positioning, and special equipment if applicable.    Freida Busman???s test was performed to ensure adequate perfusion. The patient???s left wrist was prepped and draped in sterile fashion. 1% Lidocaine was used to anesthetize the area. A 20 G Arrow line was introduced into the radial artery with ultrasound guidance. The catheter was threaded over the guide wire and the needle was removed with appropriate pulsatile blood return. The catheter was then sutured in place to the skin and a sterile CHG dressing applied. Perfusion to the extremity distal to the point of catheter insertion was checked and found to be adequate.  A pressure transducer was connected sterilely to the arterial line and an arterial line waveform was noted on the monitor.     Estimated Blood Loss: trace ml    Condition:  The patient tolerated the procedure well and remains in the same condition as pre-procedure.    Complications:  The patient tolerated the procedure well and there were no complications.    Plan:  - hemodynamic monitoring  - serial abg q4      Dallys Nowakowski M Gildardo Cranker, ACNP

## 2020-11-01 NOTE — Unmapped (Signed)
ALL    Right chest pain, cough, no fever    Fever 38 (16)    Hypoxemic 15L now BiPap 70%    RLL infiltrate    In our ER

## 2020-11-01 NOTE — Unmapped (Signed)
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 Malignant hematology was consulted for B-ALL.     Patient presented with new onset SOB, productive cough, and chills as well as right sided chest pain. HE did not report any altered mental status or neurologic changes.  CXR in ED with RLL opacity concerning for pneumonia. His respiratory status declined and he was emergently intubated in MICU.  Febrile to 38.9.  WBC elevated to 38.7 with 90+% neutrophils. Presentation most consistent with sepsis from pulmonary source.  Also on differential but considered significantly less likely CRS is related to blinatumomab given timing (D27).  Had similar presentation in April with suspected MRSA vs fungal pneumonia.  Will hold blinatumomab but do not recommend tocilizumab currently given high concern for sepsis. Increased superinfection risk with tocilizumab has been described in patients with COVID-19 pneumonia treated with tocilizumab (1).  There were no symptoms suggestive of blinatumab neurotoxicity so no indication to start steroids at this time.        Recommendations:   -Appreciate excellent care of MICU team  -Hold blinatumomab and ponatinib  -ICID following, will defer antibiotics/antibiotics to ID team  -Primary oncologist notified of his admission  -Transfuse with leukoreduced blood products (irradiated preferred) for Hgb <7 and platelets <10.     This patient has been staffed with Dr. Elmon Kirschner. These recommendations were discussed with the primary team.     Please contact the malignant hematology fellow at [phone] with any further questions.    Malignant Hematology will continue to follow.    [name], MD  [facility] Hematology & Oncology Fellow, PGY-4    Reference  (1) [email or web address]    I was available via phone/pager. I discussed the entire case the the resident on the consult service.  I reviewed the blinatumumab packager insert. I think it is very unlikely that this is blinatumumab related CRS because that typically occurs within the first few days of starting drug and he is currently day 27 of cycle 3 of treatent, which would be very unusual.  Upon arrive to the ED he did not report any neurologic toxicity to suggest blina neurotox.  He is sedated and intubated at this time so we couldn't do our own assessment of neuro stats.  Labs are not suggestive of ALL relapse as cause of symptoms since elevated WBC is 90% neutrophils.  Given how sick he is I think that it is reasonable to stop the blinatumumab and the ponatinib and treat broadly with anti-microbials as per the ICU team.  However, I think that clinical picture and timing are more c/w infection than blina toxicity so we will not recommend toci at this time since there is some data suggesting that it can increase of sepsis/infection. No steroids recommended for blina neuro tox since he didn't have any symptoms of this either.   Gae Bon, MD    -------------------------------------------------------------    HPI:  Adam Keith is a 40 y.o. 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 Malignant hematology was consulted for B-ALL. Patient unable to provide history so history obtained from chart review.  He presented to ED on 7/30 with 2 days of SOB, right sided chest pain, productive cough and chills. Initially only required 2L but had worsening respiratory symptoms requiring Bipap and then emergent intubation in MICU on 7/31. Currently intubated and sedated.     He had similar admission in 07/2020 with SOB, increased sputum, fever, and CT chest finding concerning for fungal infection.  Additionally, bronchoscopy performed during that admission, positive for MRSA.  Followed by ID and treated with lenzolid and isavuconazole.  He had recent CT chest on 7/28 that was negative for new or persistent infection.    Review of Systems: Review of Systems - Unable to obtain as patient is sedated    Oncologic History:  Oncology History Overview Note   Referring/Local Oncologist: None    Diagnosis:Ph+ ALL    Genetics:    Karyotype/FISH:Abnormal Karyotype: 46,XY,t(9;22)(q34;q11.2)[1]/45,XY,der(7;9)(q10;q10)t(9;22)(q34;q11.2),der(22)t(9;22)[11]/46,sdl,+der(22)t(9;22)[5]/46,XY[3]     Abnormal FISH: A BCR/ABL1 interphase FISH assay shows an abnormal signal pattern in 97% of the 100 cells scored. 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  The findings support a diagnosis of leukemia and have implications for targeted therapy and for monitoring residual disease.      Molecular Genetics:  BCR-ABL1 p210 transcripts were detected at a level of 46.479 IS% ratio in bone marrow.  BCR-ABL1 p190 transcripts were detected at a level of 4 in 100,000 cells in bone marrow.    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)     01/24/2020 - 04/02/2020 Chemotherapy    IP/OP LEUKEMIA GRAAPH-2005 + RITUXIMAB < 60 YO  rituximab hypercvad (odd and even course)     02/24/2020 Remission    CR with PCR MRD+; marrow showing 70% blasts with <1% blasts, negative flow MRD, BCR-ABL p210 PCR 0.197%; CNS negative for blasts     03/09/2020 Progression    CSF - rare blast ID'ed - IT chemo given    03/13/20 - IT chemo, CSF negative     04/16/2020 Biopsy    BM Bx with 40-50% cellularity, <1% blasts, MRD flow cytometry negative, BCR-ABL p210 transcripts 0.036%     05/28/2020 - 05/28/2020 Chemotherapy    OP AML - CNS THERAPY (INTRATHECAL CYTARABINE, INTRATHECAL METHOTREXATE, OR INTRATHECAL TRIPLE)  Select one of the following: cytarabine IT 100 mg with hydrocortisone 50 mg, cytarabine IT 40 mg with methotrexate 15 mg with hydrocortisone 50 mg, OR methotrexate IT 12 mg with hydrocortisone 50 mg     06/19/2020 -  Chemotherapy    IP/OP LEUKEMIA BLINATUMOMAB 7-DAY INFUSION (MINIMAL RESIDUAL DISEASE; WT >= 22 KG) (HOME INFUSION)      Cycles 1*-4: Blinatumomab 28 mcg/day Days 1-28 of 6-week cycle.  *Given in the inpatient setting on Days 1-3 on Cycle 1 and Days 1-2 on Cycle 2, while other treatment days are given in the outpatient setting.    Cycle 1 MRD Dosing     07/18/2020 Adverse Reaction    Hospitalization: fevers. Pneumonia     07/23/2020 Biopsy    Diagnosis  Bone marrow, right iliac, aspiration and biopsy  -   Normocellular bone marrow (50%) with trilineage hematopoiesis and 1% blasts by manual aspirate differential  -  Flow cytometry MRD analysis reveals no definitive immunophenotypic evidence of residual B lymphoblastic leukemia   - BCR-ABL p210 0.006%         08/10/2020 -  Chemotherapy    Cycle 2 blinatumomab-ponatinib  Ponatinib 30mg     1 IT per cycle     09/21/2020 Adverse Reaction    Covid-19, symptomatic but not requiring hospitalization.     ALL (acute lymphoblastic leukemia) (CMS-HCC)   06/11/2020 -  Chemotherapy    IP/OP LEUKEMIA BLINATUMOMAB 7-DAY INFUSION (MINIMAL RESIDUAL DISEASE; WT >= 22 KG) (HOME INFUSION)  Cycles 1*-4: Blinatumomab 28 mcg/day Days 1-28 of 6-week cycle.  *Given in the inpatient setting on Days 1-3 on Cycle 1 and Days 1-2 on Cycle 2, while other treatment days are given in the outpatient setting.     09/21/2020 Initial Diagnosis    ALL (acute lymphoblastic leukemia) (CMS-HCC)         Medical, Surgical, Social, and Family History reviewed and updated.    Social History     Social History Narrative    Not on file       Allergies: is allergic to bupropion hcl, dapsone, onion, vancomycin analogues, bismuth subsalicylate, ceftaroline fosamil, and furosemide.    Medications:   Meds:   acetaminophen  1,000 mg Enteral tube: gastric  Q8H    [START ON 11/02/2020] aspirin  81 mg Enteral tube: gastric  Daily    azithromycin  500 mg Intravenous Daily    Cefepime  2 g Intravenous BID    chlorhexidine  15 mL Topical BID    famotidine  20 mg Enteral tube: gastric  Daily    fentaNYL (PF)        gabapentin  300 mg Enteral tube: gastric  TID    heparin (porcine) for subcutaneous use  5,000 Units Subcutaneous Q8H SCH    HYDROmorphone (PF)        ipratropium-albuteroL  3 mL Nebulization Q6H (RT)    isavuconazonium sulfate  372 mg Oral Daily    linezolid  600 mg Intravenous Q12H SCH    melatonin  3 mg Enteral tube: gastric  QPM    midazolam  6 mg Intravenous Once    norepinephrine bitartrate-D5W        nortriptyline  75 mg Oral Nightly    oxyCODONE  10 mg Enteral tube: gastric  Q6H    polyethylene glycol  17 g Enteral tube: gastric  BID    propofoL        senna  2 tablet Oral Nightly    [START ON 11/02/2020] sulfamethoxazole-trimethoprim  1 tablet Enteral tube: gastric  Once per day on Mon Wed Fri    [START ON 11/02/2020] valACYclovir  500 mg Enteral tube: gastric  Daily    VECuronium         Continuous Infusions:   fentaNYL citrate (PF) 50 mcg/mL infusion 100 mcg/hr (11/01/20 1532)    norepinephrine bitartrate-NS 8 mcg/min (11/01/20 1534)    propofol 10 mg/mL infusion 30 mcg/kg/min (11/01/20 1534)     PRN Meds:.fentaNYL (PF) **OR** fentaNYL (PF), HYDROmorphone    Objective:   Vitals: Temp:  [36.3 ??C (97.3 ??F)-37.3 ??C (99.1 ??F)] 36.8 ??C (98.3 ??F)  Core Temp:  [38.9 ??C (102.02 ??F)] 38.9 ??C (102.02 ??F)  Heart Rate:  [85-139] 107  SpO2 Pulse:  [85-138] 108  Resp:  [16-40] 32  BP: (118-168)/(73-114) 147/101  MAP (mmHg):  [88-126] 108  A BP-2: (78-101)/(51-71) 78/51  MAP:  [61 mmHg-81 mmHg] 61 mmHg  FiO2 (%):  [65 %-100 %] 100 %  SpO2:  [81 %-100 %] 81 %  BMI (Calculated):  [32.08] 32.08    Physical Exam:    GEN: Intubated and sedated   HEENT: sclerae anicteric, ETT in place  NECK: Supple, no JVD  HEME/LYMPH: No palpable cervical or supraclavicular lymphadenopathy  CVL: Right chest port with dressing c/d/i, no surrounding erythema, warmth, tenderness or drainage  CV: Tachycardia, regular rhythm  RESP: Mechanically ventilated breath sounds  GI: Soft, non-tender, non-distended, normoactive bowel sounds, no hepatosplenomegaly  EXT: warm, well-perfused, no lower extremity edema  MSK:  No bony pain or tenderness.   SKIN: No rashes or ecchymoses  NEURO: Sedated  PSYCH: Unable to assess      Test Results  Recent Labs     11/01/20  0004 11/01/20  0628 11/01/20  1322 11/01/20  1427   WBC 38.7* 25.7*  --   --    NEUTROABS 35.3* 24.2*  --   --    HGB 16.1 13.6 13.9 13.3*   PLT 185 198  --   --        Imaging: Radiology studies were personally reviewed

## 2020-11-01 NOTE — Unmapped (Addendum)
[facility]  Emergency Department Provider Note       ED Clinical Impression     Final diagnoses:   Pneumonia of right lower lobe due to infectious organism (Primary)   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)   Respiratory distress        Impression, Medical Decision Making, ED Course     Impression: 40 y.o. male who has a past medical history of ALL currently on ponatinib-blinatumomab, history of septic shock, candida krausei fungemia, MRSA bacteremia w/ septic emboli causing renal failure and respiratory distress requiring intubation in the past who presents with lateral chest pain as described below.    BP 130/88  - Pulse 87  - Temp 37.3 ??C (99.1 ??F) (Oral)  - Resp 22  - Ht 188 cm (6' 2)  - Wt (!) 113.4 kg (250 lb)  - SpO2 93%  - BMI 32.10 kg/m??     Initial vitals as above.  Patient is tachycardic but otherwise hemodynamically stable. Upon initial evaluation in the emergency department, patient is lying in bed and appears uncomfortable due to pain. On exam, patient is tachycardic. Satting 95% on 2L. He has mild epigastric tenderness to palpation. Given patient's history of ALL on monoclonal antibody therapy, history of MRSA and candidemia in the past, with new onset chest pain and shortness of breath, plan for sepsis workup. Patient has a history of DRESS in response to cefepime in the past, thus will provide broad spectrum antibiotic coverage with Vancomycin, aztreonam, and fluconazole. Will give dilaudid for pain and 1L LR given his decreased PO intake.     Diagnostic workup as below.     Orders Placed This Encounter   Procedures   ??? Blood Culture #1   ??? Blood Culture #2   ??? Rapid Influenza/RSV/COVID PCR   ??? XR Chest 2 views   ??? CBC w/ Differential   ??? Basic metabolic panel   ??? Magnesium Level   ??? Phosphorus Level   ??? Lactate, Venous, Whole Blood   ??? Potassium Level   ??? Comprehensive Metabolic Panel   ??? Lactate Sepsis, Venous   ??? Urinalysis with Culture Reflex   ??? Blood Gas, Venous   ??? Insert Peripheral IV   ??? Misc nursing order (Sepsis Timer)   ??? Cardiac Monitor   ??? Oxygen sat continuous monitoring   ??? Nursing oxygen orders / instructions   ??? Notify Provider   ??? In and Out (I & O) cath   ??? Notify Provider   ??? Obtain medical records   ??? Vital signs   ??? Notify pharmacy immediately that the ED Adult Sepsis order set has been initiated.   ??? ECG 12 lead (Adult)   ??? Place Patient in Bed   ??? ED Admit Decision (ADT9)   ??? ED Admit Decision     ED Course as of 11/01/20 0500   Sun Nov 01, 2020   0200 Lactate 2.2. BMP shows Cr of 2.46, up from 1.82 on 1/26.    0330 CXR shows right sided consolidation concerning for pneumonia.    1610 Upon reevaluation of the patient, patient's O2 sat decreased to mid 80s.  Patient placed on NRB at 15L with improvement to 90%. Patient has expiratory wheezes bilaterally. Will place patient on BiPap and give DuoNebs.    9604 Patient to be admitted to MDI.        ____________________________________________    The case was discussed with Dr. Irven Baltimore, who  is in agreement with the above assessment and plan.    Dictation software was used while making this note. Please excuse any errors made with dictation software.     Additional Medical Decision Making     I have reviewed the vital signs and the nursing notes. Labs and radiology results that were available during my care of the patient were independently reviewed by me and considered in my medical decision making.     I independently visualized the EKG tracing if performed.  I independently visualized the radiology images if performed.  I reviewed the patient's prior medical records if available.  Additional history obtained from family if available.  I discussed the case with the admitting provider and the consulting services if the patient was admitted and/or consulting services were utilized.     History     Chief Complaint  Chief Complaint   Patient presents with   ??? Chest Pain   ??? Cough       HPI Adam [name] with a past medical history of ALL currently on ponatinib-blinatumomab, history of septic shock, candida krausei fungemia, MRSA bacteremia w/ septic emboli causing renal failure and respiratory distress requiring intubation in the past presents for evaluation of chest pain and cough. Patient reports sudden onset of right-sided lateral chest and rib pain 2 days ago with associated shortness of breath and cough.  Endorses the pain has progressively worsened since onset.  Denies recent trauma or falls.  Denies fever, nausea, vomiting, abdominal pain.  Reports decrease p.o. intake secondary to the pain.  Denies recent sick contacts.  No history of DVT or PE in the past.     ROS:   Constitutional: Negative for fever.  Eyes: Negative for visual changes.  ENT: Negative for sore throat.  Cardiovascular: Right lateral chest pain. Negative for palpitations.   Respiratory: Shortness of breath and cough.   Gastrointestinal: Negative for abdominal pain, vomiting, or diarrhea.  Genitourinary: Negative for dysuria.   Musculoskeletal: Negative for back pain.  Skin: Negative for rash.  Neurological: Negative for headaches, focal weakness, or numbness.     All other systems have been reviewed and are negative except as otherwise documented.    Past Medical History:   Diagnosis Date   ??? 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 BONE MARROW BIOPSY & ASPIRATION  01/21/2020        ??? IR INSERT PORT AGE GREATER THAN 5 YRS  03/10/2020    IR INSERT PORT AGE GREATER THAN 5 YRS 03/10/2020 [name], MD IMG VIR [facility]   ??? PR BRONCHOSCOPY,DIAGNOSTIC W LAVAGE Bilateral 07/20/2020    Procedure: BRONCHOSCOPY, RIGID OR FLEXIBLE, INCLUDE FLUOROSCOPIC GUIDANCE WHEN PERFORMED; W/BRONCHIAL ALVEOLAR LAVAGE WITH MODERATE SEDATION;  Surgeon: Dellis Filbert, MD;  Location: BRONCH PROCEDURE LAB [facility];  Service: Pulmonary         Current Facility-Administered Medications:   ???  albuterol 2.5 mg /3 mL (0.083 %) nebulizer solution 5 mg, 5 mg, Nebulization, Once, Suzzanne Cloud, MD  ???  albuterol 2.5 mg /3 mL (0.083 %) nebulizer solution 5 mg, 5 mg, Nebulization, Once, Suzzanne Cloud, MD  ???  ipratropium (ATROVENT) 0.02 % nebulizer solution 500 mcg, 500 mcg, Nebulization, Once, Suzzanne Cloud, MD  ???  ipratropium (ATROVENT) 0.02 % nebulizer solution 500 mcg, 500 mcg, Nebulization, Once, Suzzanne Cloud, MD  ???  lidocaine (LIDODERM) 5 % patch 1 patch, 1 patch, Transdermal, Once, Collyn Huston Foley, MD  ???  vancomycin (VANCOCIN) 2000 mg IVPB in 500 mL sodium chloride 0.9% (premix), 2,000 mg, Intravenous, Once, Suzzanne Cloud, MD, Last Rate: 250 mL/hr at 11/01/20 0338, 2,000 mg at 11/01/20 1308    Current Outpatient Medications:   ???  acetaminophen (TYLENOL) 325 MG tablet, Take 650 mg by mouth every six (6) hours as needed for pain. , Disp: , Rfl:   ???  amLODIPine (NORVASC) 10 MG tablet, Take 1 tablet (10 mg total) by mouth daily., Disp: 30 tablet, Rfl: 0  ???  aspirin 81 MG chewable tablet, Chew 1 tablet (81 mg total) daily., Disp: 36 tablet, Rfl: 11  ???  carvediloL (COREG) 12.5 MG tablet, TAKE 1 TABLET BY MOUTH TWO TIMES A DAY., Disp: 180 tablet, Rfl: 1  ???  clonazePAM (KLONOPIN) 0.5 MG tablet, Take 1 tablet (0.5 mg total) by mouth two (2) times a day as needed for anxiety or sleep., Disp: 60 tablet, Rfl: 1  ???  gabapentin (NEURONTIN) 300 MG capsule, Take 1 capsule (300 mg total) by mouth Three (3) times a day., Disp: 270 capsule, Rfl: 6  ???  hydrOXYzine (ATARAX) 25 MG tablet, Take 1 tablet (25 mg total) by mouth every six (6) hours as needed., Disp: 60 tablet, Rfl: 1  ???  isavuconazonium sulfate (CRESEMBA) 186 mg cap capsule, Take 2 capsules (372 mg total) by mouth daily., Disp: 56 capsule, Rfl: 2  ???  nortriptyline (PAMELOR) 75 MG capsule, Take 1 capsule (75 mg total) by mouth nightly., Disp: 30 capsule, Rfl: 11  ???  oxyCODONE (OXYCONTIN) 15 mg 12 hr crush resistant ER/CR tablet, Take 1 tablet (15 mg total) by mouth two (2) times a day for 28 days., Disp: 56 tablet, Rfl: 0  ???  OXYCONTIN 15 mg 12 hr crush resistant ER/CR tablet, TAKE 1 TABLET (15 MG TOTAL) BY MOUTH TWO (2) TIMES A DAY FOR 28 DAYS., Disp: , Rfl:   ???  PONATinib (ICLUSIG) 30 mg tablet, Take 1 tablet (30 mg total) by mouth daily. Swallow tablets whole. Do not crush, break, cut or chew tablets., Disp: 30 tablet, Rfl: 5  ???  prochlorperazine (COMPAZINE) 10 MG tablet, Take 1 tablet (10 mg total) by mouth every six (6) hours as needed for nausea (if no relief from zofran (ondansetron))., Disp: 60 tablet, Rfl: 1  ???  sulfamethoxazole-trimethoprim (BACTRIM DS) 800-160 mg per tablet, Take 1 tablet (160 mg of trimethoprim total) by mouth 2 times a day on Saturday, Sunday. For prophylaxis while on chemo., Disp: 48 tablet, Rfl: 3  ???  valACYclovir (VALTREX) 500 MG tablet, Take 1 tablet (500 mg total) by mouth daily., Disp: 90 tablet, Rfl: 11    Allergies  Bupropion hcl, Dapsone, Onion, Vancomycin analogues, Bismuth subsalicylate, Ceftaroline fosamil, and Furosemide    Family History   Problem Relation Age of Onset   ??? Melanoma Neg Hx    ??? Basal cell carcinoma Neg Hx    ??? Squamous cell carcinoma Neg Hx        Social History  Social History     Tobacco Use   ??? Smoking status: Former Smoker     Packs/day: 2.00     Types: Cigarettes     Quit date: 01/20/2020     Years since quitting: 0.7   ??? Smokeless tobacco: Former Neurosurgeon     Types: Musician Use   ??? Vaping Use: Never used  Physical Exam     VITAL SIGNS:      Vitals:    10/31/20 2330 11/01/20 0208 11/01/20 0301 11/01/20 0401   BP: 119/77 123/79 133/73 130/88   Pulse: 114 102 94 87   Resp: 20  22 22    Temp: 37.3 ??C (99.1 ??F)      TempSrc: Oral      SpO2: 94%  91% 93%   Weight: (!) 113.4 kg (250 lb)      Height: 188 cm (6' 2)          Constitutional: Alert and oriented. Appears uncomfortable.   Eyes: Conjunctivae are normal.  HEENT: Normocephalic and atraumatic. Conjunctivae clear. No congestion. Moist mucous membranes.   Cardiovascular: Tachycardic rate, regular rhythm. Tenderness over right lateral chest wall. Normal and symmetric distal pulses. Brisk capillary refill.   Respiratory: Normal respiratory effort. Mild expiratory wheezing bilaterally.  Gastrointestinal: Soft, non-distended, mild epigastric tenderness to palpation without rebound or guarding.   Genitourinary: Deferred.  Musculoskeletal: No lower extremity swelling or edema.   Neurologic: Normal speech and language. No gross focal neurologic deficits are appreciated. Patient is moving all extremities equally, face is symmetric at rest and with speech.  Skin: Skin is warm, dry and intact. No rash noted.  Psychiatric: Mood and affect are normal. Speech and behavior are normal.     Radiology     XR Chest 2 views   Preliminary Result      Right-sided consolidations, concerning for pneumonia           Laboratory Data     Lab Results   Component Value Date    WBC 38.7 (H) 11/01/2020    HGB 16.1 11/01/2020    HCT 46.4 11/01/2020    PLT 185 11/01/2020       Lab Results   Component Value Date    NA 135 11/01/2020    K 4.4 11/01/2020    K 4.4 11/01/2020    CL 104 11/01/2020    CO2 22.0 11/01/2020    BUN 27 (H) 11/01/2020    CREATININE 2.46 (H) 11/01/2020    GLU 108 11/01/2020    CALCIUM 9.8 11/01/2020    MG 1.7 11/01/2020    PHOS 5.1 11/01/2020       Lab Results   Component Value Date    BILITOT 0.5 11/01/2020    BILIDIR 0.10 08/11/2020    PROT 7.4 11/01/2020    ALBUMIN 3.8 11/01/2020    ALT 61 (H) 11/01/2020    AST 21 11/01/2020    ALKPHOS 104 11/01/2020       Lab Results   Component Value Date    INR 0.97 04/15/2020    APTT 38.0 (H) 04/15/2020       Pertinent labs & imaging results that were available during my care of the patient were reviewed by me and considered in my medical decision making (see chart for details).    Portions of this record have been created using Scientist, clinical (histocompatibility and immunogenetics). Dictation errors have been sought, but may not have been identified and corrected.     Suzzanne Cloud, MD  Resident  11/01/20 0500       Suzzanne Cloud, MD  Resident  11/01/20 [phone]

## 2020-11-01 NOTE — Unmapped (Signed)
Pt WC to triage. Per pt, he is having SOB & pain in his Rt rib that started 2 days ago. +Cough. Dry heaves last night. On chemo for leukemia, running it currently. Denies any fevers. No trauma to chest recently. No fever. Hx of neutropenia, but not recently. CBC last checked on Tuesday. Pt in obvious discomfort but otherwise in NAD.

## 2020-11-01 NOTE — Unmapped (Signed)
MICU Daily Progress Note     Date of Service: 11/01/2020    Problem List:   Principal Problem:    Pneumonia  Active Problems:    Tobacco use disorder    AKI (acute kidney injury) (CMS-HCC)    Recurrent major depressive disorder, in partial remission (CMS-HCC)    Hypogammaglobulinemia (CMS-HCC)    ALL (acute lymphoblastic leukemia) (CMS-HCC)    Respiratory failure with hypoxia (CMS-HCC)    Chronic pain  Resolved Problems:    * No resolved hospital problems. *      HPI: Adam [name] with PH+ B-ALL, COPD, hospitalization 07/2020 w/ MRSA bacteremia, Candida Krusei fungemia, active smoking, sp Covid infection 09/2020 followed by [facility] ID who presented to [facility] with worsening SOB x48 hours.    History provided by patient and wife. He was in his normal state of health until 10/29/20 when he started developing worsening SOB, productive cough and chills. Coughing spells were intermittently associated with he denies sick contacts, fever, diarrhea, dysuria, polyuria, aspiration or focalizing pain. Pt endorses taking all his medicines as prescribed including antifungal and ppx meds.     Of not pt hospitalized 08/2020 for induction therapy that was complicated by Septic Shock due to MRSA and Candida krusei fungemia, acute renal failure necessitating temporary iHD and AHRB requiring intubation and stay in MICU    Pt endorses being full code status and his decision maker is his wife as listed in epic.     Neurological   Chronic Pain, Anxiety Followed by Palliative care  -- holding home oxycontin 15mg  bid, dilaudid 2mg  po q4 prn mod sev pain and klonipin 0.5mg  bid prn anxiety  - cont home gabapentin     Depression  -- Continue home nortriptyline    #Sedation, acute metabolic encephalopathy  - neuro checks: q4h  - prop and fent gtts for vent compliance and comfort, goal RASS 2- to 3-  - scheduled oxy 10q6      Pulmonary   AHRF 2/2 Pneumonia with History of Intubation, tobacco abuse   Arrived in ed needing bipap placement 70%. Duonebs given 2/2 history of copd and wheezing RLL in area of consolidation. Pt down to 65% with transition to Bipap 10/5.  - amidst scheduled nebs, airway clearance and narcotic for splinting, pt progressed to needing intubation d/t inc WOB and worsening hypoxia  - RSI per anesthesia w difficulty oxygenating afterwards, likely r/t de-recruitment and shunt physiology  - cont duoneb scheduled q6    OSA  Pt with known sleep apnea on sleep study. Does not use cpap.    Cardiovascular   Tachycardia  Hypotension s/p intubationLD  - normotensive on arrival, tachy improved w fluid and analgesia  - EKG no evid acs and trop flat  - addtl bolus given on arrival to MICU and peri intubation  - levo started for MAP goals >65  - POCUS suggests nml EF BIV, uta IVC w rim pericardial effusion  - formal echo pending  - cont home regimen asa    Renal   AKI on CKD  Metabolic acidosis  Baseline Cr around 1.8. AKI to 2.4 most likely pre-renal in setting of NV from pneumonia and illness.  -- trend I/O w foley   -- 1 L LR prior to transfer  -- downtrend Cr since admit w volume repletion    Infectious Disease/Autoimmune   Pneumonia w hx MRSA bacteremia, cand fungemia  48 hours of chills, sob and productive cough with  cxr cf rll pneumonia and leukocytosis. Most likely bacterial infection as pt on ppx  With cresemba, bactrim and valtrex. Initial lactate 2.2 down to 1.2 with IVF resusitation. Given vanc in ED and will stop vancomycin given hx of hearing loss.    - febrile, leukocytosis   - transferred to [facility] on linezolid, mero, mica >> changed mero to dapto and cefepime given past tolerance to cefepime  - cont abd regimen per ICID  - scheduled apap for fevers  - f/u cx data >> one of two blodd cx positive for GPC, known prior mrsa  - cont contact precautions for MRSA  - holding BAL for now given tenuous resp status and reasonable trach aspirate     PPX:  -- Continue Home crescemba  -- Continue home bactrim, ss MWF  -- continue home valtrex 500mg  daily    Cultures:  Blood Culture, Routine (no units)   Date Value   11/01/2020 Positive Culture, Results to Follow (A)     WBC (10*9/L)   Date Value   11/01/2020 25.7 (H)         History of Dress  Hx of dress on ceftaroline was on prednisone taper in may 2022 now off.  -- monitor for dress while undergoing tx with abx    FEN/GI   #NAI  - started TF, adv to goal as tol  - nutrition consult pending  - pepcid for gi ppx >> stop when TF at goal  - senna and miralax for bowel regimen       Malnutrition Assessment: Not done yet.  Body mass index is 32.1 kg/m??.               Heme/Coag   Ph+ B-ALL  Followed by [facility] [name] on  C3D27 ponatinib-binatumomab running infusion upon transfer to MICU.   -- Consult Hem/Onc regarding chemotherapy/immunotherapy >> holding infusion for now, stopped 7/31  -- cont asa 81mg  daily while on ponatinib per onc recs    Hypogammaglobulinemia  Recieves IVIG q 6 weeks. Will check Ig levels as may benefit from IVIG in setting of infection.  --IgM, IgA, IgG    Endocrine   NAI  - accu checks q6    Integumentary   #NAI  - WOCN consulted for high risk skin assessment Yes.  - WOCN recs >> pending   - cont pressure mitigating precautions per skin policy    Prophylaxis/LDA/Restraints/Consults   Can CVC be removed? No: need for medications requiring central access (e.g. pressors)   Can A-line be removed? N/A, no A-line present  Can Foley be removed? N/A, no Foley present  Mobility plan: Step 4 - Out of bed assessment (sit at bedside)    Feeding: Trickle feeds, advance as tolerated  Analgesia: Pain not adequately controlled, titrating medications  Sedation SAT/SBT: No FiO2 > .50, PEEP > 8 and Pressors, excluding vasopressin or dopamine </=5 mcg/kg/min  Thromboembolic ppx: SQ heparin  Head of bed >30 degrees: Yes  Ulcer ppx: Not indicated  Glucose within target range: Yes, in range    Does patient need/have an active type/screen? Yes    RASS at goal? No - adjusting sedation or order to reflect need  [place] Agitation Assessment Scale (RASS) : Frequent nonpurposeful movement, fights ventilator (11/01/2020  7:01 AM)     Can antipsychotics be stopped? N/A, not on antipsychotics  CAM-ICU Result: CAM-ICU Negative (02/24/2020  8:30 PM)      Would hospice care be appropriate for this patient? No, patient  improving or expected to improve    Patient Lines/Drains/Airways Status     Active Active Lines, Drains, & Airways     Name Placement date Placement time Site Days    Power Port--a-Cath Single Hub 03/10/20 Right Internal jugular 03/10/20  0935  Internal jugular  235    Peripheral IV 11/01/20 Right Forearm 11/01/20  0135  Forearm  less than 1    Peripheral IV 11/01/20 Left Forearm 11/01/20  0330  Forearm  less than 1              Patient Lines/Drains/Airways Status     Active Wounds     Name Placement date Placement time Site Days    Wound 01/30/20 Pressure Injury Coccyx Mid Stage 3 01/30/20  --  Coccyx  276                Goals of Care     Code Status: Full Code    Designated Healthcare Decision Maker:  Mr. Rajewski current decisional capacity for healthcare decision-making is Full capacity. His designated Educational psychologist) is/are   HCDM (patient stated preference): Seubert, [name] - Spouse - [phone].      Subjective     HPI:  See above      Allergies  Allergies   Allergen Reactions   ??? Bupropion Hcl Other (See Comments)     Per patient out of touch with reality, suicidal, homicidal   ??? 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 Onion Anaphylaxis   ??? Vancomycin Analogues      Hearing loss with Lasix  Other reaction(s): Other (See Comments)  Hearing loss  lasix   ??? Bismuth Subsalicylate Nausea And Vomiting   ??? Ceftaroline Fosamil Rash and Other (See Comments)     Rash X 2 and pancytopenia 02/2020   ??? Furosemide      With Vancomycin caused hearing loss  Other reaction(s): Other (See Comments)  With Vancomycin caused hearing loss       Meds  No current facility-administered medications on file prior to encounter.     Current Outpatient Medications on File Prior to Encounter   Medication Sig   ??? acetaminophen (TYLENOL) 325 MG tablet Take 650 mg by mouth every six (6) hours as needed for pain.    ??? amLODIPine (NORVASC) 10 MG tablet Take 1 tablet (10 mg total) by mouth daily.   ??? aspirin 81 MG chewable tablet Chew 1 tablet (81 mg total) daily.   ??? carvediloL (COREG) 12.5 MG tablet TAKE 1 TABLET BY MOUTH TWO TIMES A DAY.   ??? clonazePAM (KLONOPIN) 0.5 MG tablet Take 1 tablet (0.5 mg total) by mouth two (2) times a day as needed for anxiety or sleep.   ??? gabapentin (NEURONTIN) 300 MG capsule Take 1 capsule (300 mg total) by mouth Three (3) times a day.   ??? hydrOXYzine (ATARAX) 25 MG tablet Take 1 tablet (25 mg total) by mouth every six (6) hours as needed.   ??? isavuconazonium sulfate (CRESEMBA) 186 mg cap capsule Take 2 capsules (372 mg total) by mouth daily.   ??? oxyCODONE (OXYCONTIN) 15 mg 12 hr crush resistant ER/CR tablet Take 1 tablet (15 mg total) by mouth two (2) times a day for 28 days.   ??? OXYCONTIN 15 mg 12 hr crush resistant ER/CR tablet TAKE 1 TABLET (15 MG TOTAL) BY MOUTH TWO (2) TIMES A DAY FOR 28 DAYS.   ??? PONATinib (  ICLUSIG) 30 mg tablet Take 1 tablet (30 mg total) by mouth daily. Swallow tablets whole. Do not crush, break, cut or chew tablets.   ??? prochlorperazine (COMPAZINE) 10 MG tablet Take 1 tablet (10 mg total) by mouth every six (6) hours as needed for nausea (if no relief from zofran (ondansetron)).   ??? sulfamethoxazole-trimethoprim (BACTRIM DS) 800-160 mg per tablet Take 1 tablet (160 mg of trimethoprim total) by mouth 2 times a day on Saturday, Sunday. For prophylaxis while on chemo.   ??? valACYclovir (VALTREX) 500 MG tablet Take 1 tablet (500 mg total) by mouth daily.   ??? nortriptyline (PAMELOR) 75 MG capsule Take 1 capsule (75 mg total) by mouth nightly.       Past Medical History  Past Medical History:   Diagnosis Date   ??? 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 BONE MARROW BIOPSY & ASPIRATION  01/21/2020        ??? IR INSERT PORT AGE GREATER THAN 5 YRS  03/10/2020    IR INSERT PORT AGE GREATER THAN 5 YRS 03/10/2020 [name], MD IMG VIR [facility]   ??? PR BRONCHOSCOPY,DIAGNOSTIC W LAVAGE Bilateral 07/20/2020    Procedure: BRONCHOSCOPY, RIGID OR FLEXIBLE, INCLUDE FLUOROSCOPIC GUIDANCE WHEN PERFORMED; W/BRONCHIAL ALVEOLAR LAVAGE WITH MODERATE SEDATION;  Surgeon: Dellis Filbert, MD;  Location: BRONCH PROCEDURE LAB [facility];  Service: Pulmonary         Objective     Vitals - past 24 hours  Temp:  [36.6 ??C (97.8 ??F)-37.3 ??C (99.1 ??F)] 36.6 ??C (97.8 ??F)  Heart Rate:  [85-114] 104  SpO2 Pulse:  [85-114] 103  Resp:  [16-30] 30  BP: (118-168)/(73-114) 168/93  FiO2 (%):  [65 %-75 %] 65 %  SpO2:  [91 %-100 %] 91 % Intake/Output  No intake/output data recorded.     Physical Exam:    General: Fatigued, anxious and uncomfortable prior to intubation, labored  HEENT: MMM,   CV: Soft S1,S2, no murmurs, no BLE edema  Pulm: Diminished throughout and bases w R>L, apices few scattered rhonchi, no wheezing  GI:  NTND, BS+ no rebound or guarding x near Rt CPA and chest wall  MSK: normal muslce tone  Skin: no rashes/esions, clammy   Neuro: No focal deficits before or after intibation    Continuous Infusions:   ??? fentaNYL citrate (PF) 50 mcg/mL infusion 100 mcg/hr (11/01/20 1532)   ??? norepinephrine bitartrate-NS 8 mcg/min (11/01/20 1534)   ??? propofol 10 mg/mL infusion 30 mcg/kg/min (11/01/20 1727)           Scheduled Medications:   ??? aspirin  81 mg Oral Daily   ??? gabapentin  300 mg Oral TID   ??? heparin (porcine) for subcutaneous use  5,000 Units Subcutaneous Q8H [facility]   ??? ipratropium-albuteroL  3 mL Nebulization Q6H (RT)   ??? isavuconazonium sulfate  372 mg Oral Daily   ??? lactated ringers  1,000 mL Intravenous Once   ??? lactated ringers  1,000 mL Intravenous Once   ??? linezolid  600 mg Intravenous Q12H [facility]   ??? meropenem  500 mg Intravenous Q6H   ??? nortriptyline  75 mg Oral Nightly   ??? oxyCODONE  10 mg Oral BID   ??? senna  2 tablet Oral Nightly   ??? [START ON 11/02/2020] sulfamethoxazole-trimethoprim  1 tablet Oral Once per day on Mon  Wed Fri   ??? valACYclovir  500 mg Oral Daily       PRN medications:  fentaNYL (PF) **OR** fentaNYL (PF), HYDROmorphone    Data/Imaging Review: Reviewed in Epic and personally interpreted on 11/01/2020. See EMR for detailed results.      Critical Care Attestation     This patient is critically ill or injured with the impairment of vital organ systems such that there is a high probability of imminent or life threatening deterioration in the patient's condition. This patient must remain in the ICU for ongoing evaluation of the comprehensive management plan outlined in this note. I directly provided critical care services as documented in this note and the critical care time spent (70  min) is exclusive of separately billable procedures.  In addition to time spent for critical care management, I also provided advance care planning services for 0  minutes (see ACP note for details)???. Total billable critical care time 70  minutes.    Naisha Wisdom Fonnie Mu, ACNP

## 2020-11-01 NOTE — Unmapped (Signed)
_______________________________________________________________________    Attending attestation  I saw and evaluated the patient. I discussed and agree with the findings and the plan of care as documented in the fellow???s note.The patient is at risk of decompensation from infection due to underlying immunocompromise and/or mucosal barrier defects. The risks and benefits of initiating and/or continuing potentially toxic anti-infective therapies and diagnostic procedures were discussed with the care teams.     I personally reviewed updated microbiological culture and susceptibility data.     I personally reviewed updated relevant radiological studies.    Jori Moll, MD  Immunocompromised ID  Pager [phone]  _______________________________________________________________________    NEW IMMUNOCOMPROMISED HOST INFECTIOUS DISEASE CONSULT NOTE      Adam Keith is being seen in consultation at the request of Ria Comment, MD for evaluation of hypoxemia and suspected RLL pneumonia.    Assessment/Recommendations:    Adam 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  - 4/21 BMBx normocellular bone marrow 50% w/ 1% blasts (MRD)  - Extent of disease/CNS involvement: rare blast on prior CSF, intrathecal ppx (cytarabine, methotrexate, hydrocortisone) last 09/18/2020  - Cancer-related complications: TLS, hyperbilirubinemia, MRSA bacteremia w/ septic emboli/renal failure+dialysis/intubation, C. krusei fungemia  - Prior chemotherapy: [id number] induction with dasatinib (vincristine, dexamethasone and dasatinib); C1D1 01/24/2020; difficulty tolerating single-agent dasatinib  - Current chemotherapy: blinatumomab (anti-CD19/CD3), C1D1 = 3/18 (symtomatic, titrated up dose and restarted 3/21) + ponatinib (TKI)  - currently cycle 3/5  - Right IJ Port-A-Cath placed 04/15/2020  - Infection prophylaxis prior to admission: Bactrim 1 DS MWF, valacyclovir, s/p Evusheld 04/21/2020 x2 doses, isavuconazole  - on pred 15mg  daily as below    Likely DIHS/DRESS, 06/12/2020  - followed by Dr. 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    Infection History  Active infections:    #MRSA bacteremia, 11/01/2020  #RLL pulmonary consolidation on 11/01/2020 CXR, new from 7/28 CT  - 7/31 BCx 1/2 GPCs in chains at 15h  Rx: 7/31 linezolid/cefepime --> linezolid + daptomycin/cefepime    #Non-neutropenic fever and new pulmonary nodules/infiltrates, 07/17/20  - 4/15 BC x2 NGTD  - 4/16 CT Chest: multifocal somewhat nodular consolidative opacities in b/l lungs that are relatively new, most concerning for infectious PNA (possibly fungal, especially given his recent high dose steroid taper)  - 4/17 LRCx +2 MRSA  - 4/17 Legionella uAg, serum crypto Ag, serum Asp galactomannan, urine histo ag, B-D-glucan (Fungitell), serum CMV neg  - 4/18 bronch with UJW:JXBJ cx + MRSA, OP flora, RPP negative, Legionella cx, AFB cx, fungal cx, CMV PCR, PJP DFA, Asp galactomannan neg  - 7/28 CT chest resolved  Rx:  4/15 cefepime/dapto --> 4/16 cefepime --> 4/18 cefepime/isavu/linezolid -> 4/19 linezolid --> 4/21 isavuconazole/linezolid --> 5/10 isavuconazole -->    #Intertrigo in groin, 11/01/2020    Prior infections:    #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 Placebo  Tx: 10/25 vanc/cefepime --> 10/27 dapto/ceftaroline --> 11/5 ceftaroline/vanc  -> 11/16 vanc (stopped due to ototoxicity though other meds were more likely) -->Ceftaroline (w/ 1 dose DAP) 11/28 ->12/02-->Vanc 12/02 - ?    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 Trend BCx q48h until clear  ??? TTE  ??? F/u 7/31 BCx  ??? F/u 7/31 LRCx, 2+ GPCs  ??? RPP, sent  ??? Urine histo Ag  ??? Urine legionella Ag  ??? Serum crypto Ag  ??? Send CK weekly (added on)    Antibiotic toxicity monitoring  ?? At least weekly CBC w/ diff, CMP, CK    Treatment  For MRSA bacteremia + suspected MRSA PNA  ??? Daptomycin 10mg /kg, dosing per pharmacy  ??? Linezolid 600mg  q12h, via OGT or IV  ??? START vanc locks to port if sufficient time without infusion  ??? Will require port removal  ??? Recommend postponing bronchoscopy  ??? CONT cefepime 2g q8h equivalent for now  ??? STOP azithro, recommended prior to culture data    For intertrigo  ?? Apply topical antifungal, clotrimazole or nystatin powder, to groin    For hx pulmonary nodules  ?? CONT isavuconazole 372mg  daily (via OG or IV) for now    For history of DRESS  ?? Clarification of suspected triggers: reconciliation of allergy list (outpatient dermatologist?), as allergies not updated and 3/23 note reports unknown trigger whereas 5/12 dermatology note reports ceftaroline, dapsone, vancomycin and isavuconazole as possible culprits (he remains on isavuconazole and vancomycin not documented as DRESS culprit in other notes)    Prophylaxis  ?? CONT valacyclovir  ?? CONT Bactrim 1 DS tab MWF    Immunizations  ?? COVID-19 booster prior to discharge  ?? Needs Prevnar-20  ?? Shingrix  All can be given prior to discharge            The [facility] ID service will continue to follow.  Please page the ID Transplant/Liquid Oncology Fellow consult at [phone] with questions.  Patient discussed with Dr. Kari Baars.    [name], MD, MPH  Fellow, Division of Infectious Diseases    History of Present Illness:      Source of information includes:  Electronic Medical Records and Discussion with primary team MD and APP .  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 Subsequently found to have GPC bacteremia,  now known to be MRSA.     Admitting provider history provided by patient and [name] (I was only able to speak with [name] as patient had been intubated in the interim). He was in his normal state of health until 10/29/20 when he started developing worsening SOB, R sided chest pain, productive cough and chills.He denied sick contacts, fever, diarrhea, dysuria, polyuria, or aspiration. Pt endorsed taking all his medicines as prescribed including antifungal and ppx meds ([name] confirmed this hx).     Patient w/ leukocytosis to 40 on admission w/ >90% PMNs. Subsequently developed high fevers to high 38s. Initially on BiPAP w/ increasing tachypnea and hypoxemia on 100% FiO2, requiring intubation. Developed low pressor requirement after sedation.    Allergies:  Allergies   Allergen Reactions   ??? Bupropion Hcl Other (See Comments)     Per patient out of touch with reality, suicidal, homicidal   ??? 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 Onion Anaphylaxis   ??? Vancomycin Analogues      Hearing loss with Lasix  Other reaction(s): Other (See Comments)  Hearing loss  lasix   ??? Bismuth Subsalicylate Nausea And Vomiting   ??? Ceftaroline Fosamil Rash and Other (See Comments)     Rash X 2 and pancytopenia 02/2020   ??? Furosemide      With Vancomycin caused hearing loss  Other reaction(s): Other (See Comments)  With Vancomycin caused hearing loss       Medications:   Antimicrobials:  Anti-infectives (From admission, onward)      Start     Dose/Rate Route Frequency Ordered Stop    11/02/20 0900  sulfamethoxazole-trimethoprim (BACTRIM DS) 800-160 mg tablet 160 mg of trimethoprim         1 tablet Oral Once per day on Mon Wed Fri 11/01/20 0531 12/02/20 0859    11/01/20 0900  isavuconazonium sulfate (CRESEMBA) capsule 372 mg         372 mg Oral Daily (standard) 11/01/20 0531      11/01/20 0900  valACYclovir (VALTREX) tablet 500 mg         500 mg Oral Daily (standard) 11/01/20 0531      11/01/20 0900  linezolid in dextrose 5% (ZYVOX) 600 mg/300 mL IVPB 600 mg         600 mg  300 mL/hr over 60 Minutes Intravenous Every 12 hours scheduled 11/01/20 0531 11/11/20 0859    11/01/20 0600  meropenem (MERREM) 500 mg in sodium chloride 0.9 % (NS) 100 mL IVPB-connector bag         500 mg  200 mL/hr over 30 Minutes Intravenous Every 6 hours 11/01/20 0531 11/08/20 0559            Current/Prior immunomodulators:  ALL, prednisone 15, blinatumomab (anti-CD19/CD3)    Other medications reviewed.     Medical History:  Past Medical History:   Diagnosis Date   ??? 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 BONE MARROW BIOPSY & ASPIRATION  01/21/2020 ??? IR INSERT PORT AGE GREATER THAN 5 YRS  03/10/2020    IR INSERT PORT AGE GREATER THAN 5 YRS 03/10/2020 [name], MD IMG VIR [facility]   ??? PR BRONCHOSCOPY,DIAGNOSTIC W LAVAGE Bilateral 07/20/2020    Procedure: BRONCHOSCOPY, RIGID OR FLEXIBLE, INCLUDE FLUOROSCOPIC GUIDANCE WHEN PERFORMED; W/BRONCHIAL ALVEOLAR  LAVAGE WITH MODERATE SEDATION;  Surgeon: Dellis Filbert, MD;  Location: BRON[facility] PROCEDURE LAB [facility];  Service: Pulmonary       Social History:  Tobacco use:   reports that he quit smoking about 9 months ago. His smoking use included cigarettes. He smoked 2.00 packs per day. He has quit using smokeless tobacco.  His smokeless tobacco use included chew.   Alcohol use:     has no history on file for alcohol use.   Drug use:     has no history on file for drug use.   Living situation:   Lives with spouse/partner   [place]idence[place]       [place] travel:    No [place] travel outside of [employer] travel:    No travel outside of the [place] Engineer, maintenance service:   Has served in the [employer] in [employer], stateside   Employment:   [employer], works as Counsellor exposure:   Animal exposures include cats, dogs   Insect exposure:   No tick exposure   Hobbies:   Denies unusual environmental exposures   TB exposures:   No known TB exposure   Sexual history:       Other significant exposures:   No exposure to well water, No exposure to raw/undercooked foods, No exposure to young children and Never incarcerated -- 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 Melanoma Neg Hx    ??? Basal cell carcinoma Neg Hx    ??? Squamous cell carcinoma Neg Hx        Review of Systems:  All other systems reviewed are negative.          Vital Signs last 24 hours:  Temp:  [36.6 ??C (97.8 ??F)-37.3 ??C (99.1 ??F)] 36.6 ??C (97.8 ??F)  Heart Rate:  [85-114] 104  SpO2 Pulse:  [85-114] 103  Resp:  [16-30] 30  BP: (118-168)/(73-114) 168/93  MAP (mmHg):  [88-126] 116  FiO2 (%):  [65 %-75 %] 65 %  SpO2:  [91 %-100 %] 91 %  BMI (Calculated):  [32.08] 32.08    Physical Exam:  Patient Lines/Drains/Airways Status       Active Active Lines, Drains, & Airways       Name Placement date Placement time Site Days    Power Port--a-Cath Single Hub 03/10/20 Right Internal jugular 03/10/20  0935  Internal jugular  235    Peripheral IV 11/01/20 Right Forearm 11/01/20  0135  Forearm  less than 1    Peripheral IV 11/01/20 Left Forearm 11/01/20  0330  Forearm  less than 1                  GEN:   intubated, sedated  EYES: sclerae anicteric and non injected  ENT: intubated, no perioral lesions  CV: tachy, regular, no abnormal heart sounds noted and no peripheral edema  PULM: intubated, ventilated, sedated, and CTAB anteriorly. Difficult to access/hear RLL  GI: overweight, soft, nondistended, compressible  GU: intertrigo without grossly visible skin breakdown, no penile or scrotal lesions  RECTAL:deferred  SKIN:no petechiae, ecchymoses or rashes on full anterior inspection  MSK:no swollen joints (knees, ankles, elbows)  NEURO: sedated  PSYCH: sedated, unable to assess  LINES: R port, accessed and appears clean    Data for Medical Decision Making     Recent Labs   Lab Units 11/01/20  0628 11/01/20  0148 11/01/20  0004 10/27/20  1127   WBC 10*9/L 25.7*  --  38.7* 16.7*   HEMOGLOBIN g/dL 16.1  --  09.6 04.5   PLATELET COUNT (1) 10*9/L 198  --  185 225   NEUTRO ABS 10*9/L 24.2*  --  35.3* 13.8*   LYMPHO ABS 10*9/L 0.7*  --  1.1 1.7   EOSINO ABS 10*9/L 0.0  --  0.0 0.3   BUN mg/dL 22 27* 21 18   CREATININE mg/dL 4.09* 8.11* 9.14* 7.82*   AST U/L 16 21  --  18   ALT U/L 48 61*  --  31   BILIRUBIN TOTAL mg/dL 0.3 0.5  --  0.3   ALK PHOS U/L 93 104  --  89   POTASSIUM mmol/L 4.4 4.4 - 4.4  --  4.2   MAGNESIUM mg/dL 1.6  --  1.7 1.7   PHOSPHORUS mg/dL 5.4*  --  5.1 3.5   CALCIUM mg/dL 8.9 9.8 9.8 9.0 New Culture Data     Microbiology Results (last day)       Procedure Component Value Date/Time Date/Time    BC-GP Assay [[id number]]  (Abnormal) Collected: 11/01/20 0146    Lab Status: Final result Specimen: Blood from 1 Peripheral Draw Updated: 11/01/20 1847     BC-GP ASSAY Staphylococcus aureus detected; methicillin resistance detected    Narrative:      The BC-GP assay is an FDA-cleared [employer] Verigene rapid molecular test used to identify organisms directly from positive blood cultures.  The assay's performance has been verified by the Clinical Molecular Microbiology Laboratory, [facility].    Blood Culture #1 [[id number]]  (Abnormal) Collected: 11/01/20 0146    Lab Status: Preliminary result Specimen: Blood from 1 Peripheral Draw Updated: 11/01/20 1721     Blood Culture, Routine Positive Culture, Results to Follow     Gram Stain Result Gram positive cocci in clusters    Lower Respiratory Culture [[id number]] Collected: 11/01/20 1547    Lab Status: Preliminary result Specimen: Aspirate, Tracheal from Tracheal aspirate Updated: 11/01/20 1714     Gram Stain >25 PMNS/LPF      <10  Epithelial cells/LPF      2+ Gram positive cocci      Acceptable for culture    Narrative:      Specimen Source: Tracheal aspirate    Urine Culture [[id number]] Collected: 11/01/20 1602    Lab Status: In process Specimen: Urine from Clean Catch Updated: 11/01/20 1602    Fungal Culture [[id number]]     Lab Status: No result Specimen: Lavage, Bronchial from Lung, Right Lower Lobe     Legionella Culture [[id number]]     Lab Status: No result Specimen: Lavage, Bronchial from Lung, Right     Mycoplasma pneumoniae PCR [[id number]]     Lab Status: No result Specimen: Lavage, Bronchial     Pneumocystis DFA [[id number]]     Lab Status: No result Specimen: Lavage, Bronchial from Lung, Right Lower Lobe     Actinomyces Screen [[id number]]     Lab Status: No result Specimen: Lavage, Bronchial from Lung, Right Lower Lobe     AFB culture [[id number]]     Lab Status: No result Specimen: Lavage, Bronchial from Lung, Right     Aspergillus Galactomannan AG, BAL [[id number]]     Lab Status: No  result Specimen: Lavage, Bronchial from Lung, Right     Bronchial culture [[id number]]     Lab Status: No result Specimen: Lavage, Bronchial from Lung, Right Lower Lobe     Body fluid cell count [[phone]]     Lab Status: No result Specimen: Lavage, Bronchial     Respiratory Pathogen Panel with COVID-19 [[id number]]  (Normal) Collected: 11/01/20 1133    Lab Status: Final result Specimen: Nasopharyngeal Swab from Nasopharyngeal/Oropharyngeal Updated: 11/01/20 1248     Adenovirus Not Detected     Coronavirus HKU1 Not Detected     Coronavirus NL63 Not Detected     Coronavirus 229E Not Detected     Coronavirus OC43 PCR Not Detected     Metapneumovirus Not Detected     Rhinovirus/Enterovirus Not Detected     Influenza A Not Detected     Influenza B Not Detected     Parainfluenza 1 Not Detected     Parainfluenza 2 Not Detected     Parainfluenza 3 Not Detected     Parainfluenza 4 Not Detected     RSV Not Detected     Bordetella pertussis Not Detected     Comment: If B. pertussis/parapertussis infection is suspected, the Bordetella pertussis/parapertussis Qualitative PCR test should be ordered.        Bordetella parapertussis Not Detected     Chlamydophila (Chlamydia) pneumoniae Not Detected     Mycoplasma pneumoniae Not Detected     SARS-CoV-2 PCR Not Detected    Narrative:      This result was obtained using the FDA-cleared BioFire Respiratory 2.1 Panel. Performance characteristics have been established and verified by the Clinical Molecular Microbiology Laboratory, [facility]. This assay does not distinguish between rhinovirus and enterovirus. Lower respiratory specimens will not be tested for Bordetella pertussis/parapertussis. For nasopharyngeal swabs, cross-reactivity may occur between B. pertussis and non-pertussis Bordetella species. All positive B. pertussis results will be automatically confirmed using our in-house PCR assay. Ct (cycle threshold) values for SARS-CoV-2 are not available for this test.    MRSA Screen [[id number]] Collected: 11/01/20 1146    Lab Status: In process Specimen: Nares from Nose (nasal passage) Updated: 11/01/20 1146    Blood Culture #2 [[id number]] Collected: 11/01/20 0355    Lab Status: In process Specimen: Blood from 1 Peripheral Draw Updated: 11/01/20 0408    Rapid Influenza/RSV/COVID PCR [[id number]]  (Normal) Collected: 11/01/20 0136    Lab Status: Final result Specimen: Nasopharyngeal Swab Updated: 11/01/20 0229     SARS-CoV-2 PCR Negative     Influenza A Negative     Influenza B Negative     RSV Negative    Narrative:      This test was performed using the Cepheid Xpert Xpress CoV-2/Flu/RSV plus assay, which has been validated by the CLIA-certified, CAP-inspected [employer] FDA has granted Emergency Use Authorization for this test. Negative results do not preclude infection and should be interpreted along with clinical observations, patient history, and epidemiological information. Information for providers and patients can be found here: [email or web address]            Recent Studies     CT Chest Wo Contrast  Result Date: 10/29/2020  1. No significant imaging evidence of persistent or new infectious disease involving the thorax.     2. Unusual extensive calcifications associated with the myocardium of the left ventricle is again evident. Notably, the pattern of calcification is not typical for ischemic disease. If clinically relevant, MR imaging may provide additional characterization  the myocardium and assessment of wall motion.     XR Chest 2 views  Result Date: 11/01/2020    Right-sided chest port overlying the lower SVC.     Low lung volumes accentuate the pulmonary vasculature and interstitium. Asymmetrical elevation of the right hemidiaphragm. Patchy opacification along the medial right lung base, nonspecific.     No pleural effusion or pneumothorax.     Unremarkable cardiomediastinal silhouette.     No acute osseous abnormalities.       Serologies:  Lab Results   Component Value Date    Hep B Surface Ag Nonreactive 01/21/2020    Hep B S Ab Nonreactive 01/21/2020    Hep B Surf Ab Quant <8.00 01/21/2020    Hepatitis C Ab Nonreactive 01/21/2020    HSV 1 IgG Positive (A) 01/21/2020    HSV 2 IgG Negative 01/21/2020    Toxoplasma Gondii IgG Negative 02/11/2020       Immunizations:  Immunization History   Administered Date(s) Administered   ??? COVID-19 VACCINE,MRNA(MODERNA)(PF)(IM) 02/25/2020, 03/23/2020

## 2020-11-02 LAB — BLOOD GAS CRITICAL CARE PANEL, ARTERIAL
BASE EXCESS ARTERIAL: -3.6 — ABNORMAL LOW (ref -2.0–2.0)
BASE EXCESS ARTERIAL: -3.6 — ABNORMAL LOW (ref -2.0–2.0)
BASE EXCESS ARTERIAL: -4.2 — ABNORMAL LOW (ref -2.0–2.0)
BASE EXCESS ARTERIAL: -4.7 — ABNORMAL LOW (ref -2.0–2.0)
BASE EXCESS ARTERIAL: -5.2 — ABNORMAL LOW (ref -2.0–2.0)
BASE EXCESS ARTERIAL: -5.4 — ABNORMAL LOW (ref -2.0–2.0)
CALCIUM IONIZED ARTERIAL (MG/DL): 4.26 mg/dL — ABNORMAL LOW (ref 4.40–5.40)
CALCIUM IONIZED ARTERIAL (MG/DL): 4.38 mg/dL — ABNORMAL LOW (ref 4.40–5.40)
CALCIUM IONIZED ARTERIAL (MG/DL): 4.4 mg/dL (ref 4.40–5.40)
CALCIUM IONIZED ARTERIAL (MG/DL): 4.52 mg/dL (ref 4.40–5.40)
CALCIUM IONIZED ARTERIAL (MG/DL): 4.62 mg/dL (ref 4.40–5.40)
CALCIUM IONIZED ARTERIAL (MG/DL): 4.64 mg/dL (ref 4.40–5.40)
FIO2 ARTERIAL: 70
GLUCOSE WHOLE BLOOD: 112 mg/dL (ref 70–179)
GLUCOSE WHOLE BLOOD: 116 mg/dL (ref 70–179)
GLUCOSE WHOLE BLOOD: 117 mg/dL (ref 70–179)
GLUCOSE WHOLE BLOOD: 117 mg/dL (ref 70–179)
GLUCOSE WHOLE BLOOD: 117 mg/dL (ref 70–179)
GLUCOSE WHOLE BLOOD: 128 mg/dL (ref 70–179)
HCO3 ARTERIAL: 19 mmol/L — ABNORMAL LOW (ref 22–27)
HCO3 ARTERIAL: 20 mmol/L — ABNORMAL LOW (ref 22–27)
HCO3 ARTERIAL: 20 mmol/L — ABNORMAL LOW (ref 22–27)
HCO3 ARTERIAL: 21 mmol/L — ABNORMAL LOW (ref 22–27)
HCO3 ARTERIAL: 22 mmol/L (ref 22–27)
HCO3 ARTERIAL: 22 mmol/L (ref 22–27)
HEMOGLOBIN BLOOD GAS: 10.5 g/dL — ABNORMAL LOW
HEMOGLOBIN BLOOD GAS: 10.6 g/dL — ABNORMAL LOW
HEMOGLOBIN BLOOD GAS: 12.4 g/dL — ABNORMAL LOW (ref 13.50–17.50)
HEMOGLOBIN BLOOD GAS: 13.7 g/dL (ref 13.50–17.50)
HEMOGLOBIN BLOOD GAS: 8.5 g/dL — ABNORMAL LOW (ref 13.50–17.50)
HEMOGLOBIN BLOOD GAS: 9.9 g/dL — ABNORMAL LOW
LACTATE BLOOD ARTERIAL: 0.7 mmol/L (ref <1.3)
LACTATE BLOOD ARTERIAL: 0.8 mmol/L (ref <1.3)
LACTATE BLOOD ARTERIAL: 0.8 mmol/L (ref <1.3)
LACTATE BLOOD ARTERIAL: 0.9 mmol/L (ref <1.3)
LACTATE BLOOD ARTERIAL: 0.9 mmol/L (ref <1.3)
LACTATE BLOOD ARTERIAL: 1 mmol/L (ref <1.3)
O2 SATURATION ARTERIAL: 93.8 % — ABNORMAL LOW (ref 94.0–100.0)
O2 SATURATION ARTERIAL: 93.8 % — ABNORMAL LOW (ref 94.0–100.0)
O2 SATURATION ARTERIAL: 94.9 % (ref 94.0–100.0)
O2 SATURATION ARTERIAL: 95.7 % (ref 94.0–100.0)
O2 SATURATION ARTERIAL: 95.9 % (ref 94.0–100.0)
O2 SATURATION ARTERIAL: 96.2 % (ref 94.0–100.0)
PCO2 ARTERIAL: 34.7 mmHg — ABNORMAL LOW (ref 35.0–45.0)
PCO2 ARTERIAL: 38.1 mmHg (ref 35.0–45.0)
PCO2 ARTERIAL: 38.6 mmHg (ref 35.0–45.0)
PCO2 ARTERIAL: 39.6 mmHg (ref 35.0–45.0)
PCO2 ARTERIAL: 40.6 mmHg (ref 35.0–45.0)
PCO2 ARTERIAL: 41 mmHg (ref 35.0–45.0)
PH ARTERIAL: 7.32 — ABNORMAL LOW (ref 7.35–7.45)
PH ARTERIAL: 7.33 — ABNORMAL LOW (ref 7.35–7.45)
PH ARTERIAL: 7.33 — ABNORMAL LOW (ref 7.35–7.45)
PH ARTERIAL: 7.35 (ref 7.35–7.45)
PH ARTERIAL: 7.36 (ref 7.35–7.45)
PH ARTERIAL: 7.36 (ref 7.35–7.45)
PO2 ARTERIAL: 65.1 mmHg — ABNORMAL LOW (ref 80.0–110.0)
PO2 ARTERIAL: 70.2 mmHg — ABNORMAL LOW (ref 80.0–110.0)
PO2 ARTERIAL: 73.6 mmHg — ABNORMAL LOW (ref 80.0–110.0)
PO2 ARTERIAL: 73.8 mmHg — ABNORMAL LOW (ref 80.0–110.0)
PO2 ARTERIAL: 78.2 mmHg — ABNORMAL LOW (ref 80.0–110.0)
PO2 ARTERIAL: 79.9 mmHg — ABNORMAL LOW (ref 80.0–110.0)
POTASSIUM WHOLE BLOOD: 3.5 mmol/L (ref 3.4–4.6)
POTASSIUM WHOLE BLOOD: 3.9 mmol/L (ref 3.4–4.6)
POTASSIUM WHOLE BLOOD: 4 mmol/L (ref 3.4–4.6)
POTASSIUM WHOLE BLOOD: 4.1 mmol/L (ref 3.4–4.6)
POTASSIUM WHOLE BLOOD: 4.1 mmol/L (ref 3.4–4.6)
POTASSIUM WHOLE BLOOD: 4.3 mmol/L (ref 3.4–4.6)
SODIUM WHOLE BLOOD: 132 mmol/L — ABNORMAL LOW (ref 135–145)
SODIUM WHOLE BLOOD: 133 mmol/L — ABNORMAL LOW (ref 135–145)
SODIUM WHOLE BLOOD: 134 mmol/L — ABNORMAL LOW (ref 135–145)
SODIUM WHOLE BLOOD: 135 mmol/L (ref 135–145)
SODIUM WHOLE BLOOD: 135 mmol/L (ref 135–145)
SODIUM WHOLE BLOOD: 136 mmol/L (ref 135–145)

## 2020-11-02 LAB — CK: CREATINE KINASE TOTAL: 38 U/L — ABNORMAL LOW

## 2020-11-02 LAB — COMPREHENSIVE METABOLIC PANEL
ALBUMIN: 2.2 g/dL — ABNORMAL LOW (ref 3.4–5.0)
ALKALINE PHOSPHATASE: 87 U/L (ref 46–116)
ALT (SGPT): 27 U/L (ref 10–49)
ANION GAP: 8 mmol/L (ref 5–14)
AST (SGOT): 9 U/L (ref <=34)
BILIRUBIN TOTAL: 0.2 mg/dL — ABNORMAL LOW (ref 0.3–1.2)
BLOOD UREA NITROGEN: 42 mg/dL — ABNORMAL HIGH (ref 9–23)
BUN / CREAT RATIO: 15
CALCIUM: 8.3 mg/dL — ABNORMAL LOW (ref 8.7–10.4)
CHLORIDE: 106 mmol/L (ref 98–107)
CO2: 20 mmol/L (ref 20.0–31.0)
CREATININE: 2.74 mg/dL — ABNORMAL HIGH
EGFR CKD-EPI (2021) MALE: 29 mL/min/{1.73_m2} — ABNORMAL LOW (ref >=60)
GLUCOSE RANDOM: 115 mg/dL (ref 70–179)
POTASSIUM: 4.5 mmol/L (ref 3.4–4.8)
PROTEIN TOTAL: 4.9 g/dL — ABNORMAL LOW (ref 5.7–8.2)
SODIUM: 134 mmol/L — ABNORMAL LOW (ref 135–145)

## 2020-11-02 LAB — CBC W/ AUTO DIFF
BASOPHILS ABSOLUTE COUNT: 0.1 10*9/L (ref 0.0–0.1)
BASOPHILS RELATIVE PERCENT: 0.6 %
EOSINOPHILS ABSOLUTE COUNT: 1.1 10*9/L — ABNORMAL HIGH (ref 0.0–0.5)
EOSINOPHILS RELATIVE PERCENT: 4.8 %
HEMATOCRIT: 32.9 % — ABNORMAL LOW (ref 39.0–48.0)
HEMOGLOBIN: 11.3 g/dL — ABNORMAL LOW (ref 12.9–16.5)
LYMPHOCYTES ABSOLUTE COUNT: 0.4 10*9/L — ABNORMAL LOW (ref 1.1–3.6)
LYMPHOCYTES RELATIVE PERCENT: 2 %
MEAN CORPUSCULAR HEMOGLOBIN CONC: 34.3 g/dL (ref 32.0–36.0)
MEAN CORPUSCULAR HEMOGLOBIN: 32.2 pg (ref 25.9–32.4)
MEAN CORPUSCULAR VOLUME: 94 fL (ref 77.6–95.7)
MEAN PLATELET VOLUME: 8.7 fL (ref 6.8–10.7)
MONOCYTES ABSOLUTE COUNT: 0.6 10*9/L (ref 0.3–0.8)
MONOCYTES RELATIVE PERCENT: 2.5 %
NEUTROPHILS ABSOLUTE COUNT: 20 10*9/L — ABNORMAL HIGH (ref 1.8–7.8)
NEUTROPHILS RELATIVE PERCENT: 90.1 %
PLATELET COUNT: 192 10*9/L (ref 150–450)
RED BLOOD CELL COUNT: 3.5 10*12/L — ABNORMAL LOW (ref 4.26–5.60)
RED CELL DISTRIBUTION WIDTH: 16 % — ABNORMAL HIGH (ref 12.2–15.2)
WBC ADJUSTED: 22.3 10*9/L — ABNORMAL HIGH (ref 3.6–11.2)

## 2020-11-02 LAB — BASIC METABOLIC PANEL
ANION GAP: 6 mmol/L (ref 5–14)
BLOOD UREA NITROGEN: 42 mg/dL — ABNORMAL HIGH (ref 9–23)
BUN / CREAT RATIO: 17
CALCIUM: 7.9 mg/dL — ABNORMAL LOW (ref 8.7–10.4)
CHLORIDE: 108 mmol/L — ABNORMAL HIGH (ref 98–107)
CO2: 21 mmol/L (ref 20.0–31.0)
CREATININE: 2.54 mg/dL — ABNORMAL HIGH
EGFR CKD-EPI (2021) MALE: 32 mL/min/{1.73_m2} — ABNORMAL LOW (ref >=60)
GLUCOSE RANDOM: 101 mg/dL (ref 70–179)
POTASSIUM: 4.3 mmol/L (ref 3.5–5.1)
SODIUM: 135 mmol/L (ref 135–145)

## 2020-11-02 LAB — PHOSPHORUS: PHOSPHORUS: 5.3 mg/dL — ABNORMAL HIGH (ref 2.4–5.1)

## 2020-11-02 LAB — CREATININE, URINE: CREATININE, URINE: 170 mg/dL

## 2020-11-02 LAB — CRYPTOCOCCAL ANTIGEN, SERUM: CRYPTOCOCCAL ANTIGEN: NEGATIVE

## 2020-11-02 LAB — TRIGLYCERIDES
TRIGLYCERIDES: 510 mg/dL — ABNORMAL HIGH (ref 0–150)
TRIGLYCERIDES: 817 mg/dL — ABNORMAL HIGH (ref 0–150)

## 2020-11-02 LAB — MAGNESIUM: MAGNESIUM: 2.2 mg/dL (ref 1.6–2.6)

## 2020-11-02 LAB — CMV DNA, QUANTITATIVE, PCR: CMV VIRAL LD: NOT DETECTED

## 2020-11-02 LAB — LEGIONELLA ANTIGEN, URINE: LEGIONELLA, URINARY ANTIGEN: NEGATIVE

## 2020-11-02 LAB — SODIUM, URINE, RANDOM: SODIUM URINE: 10 mmol/L

## 2020-11-02 MED ADMIN — gabapentin (NEURONTIN) capsule 300 mg: 300 mg | GASTROENTERAL | @ 13:00:00

## 2020-11-02 MED ADMIN — oxyCODONE (ROXICODONE) immediate release tablet 10 mg: 10 mg | GASTROENTERAL | @ 04:00:00 | Stop: 2020-11-15

## 2020-11-02 MED ADMIN — linezolid in dextrose 5% (ZYVOX) 600 mg/300 mL IVPB 600 mg: 600 mg | INTRAVENOUS | @ 03:00:00 | Stop: 2020-11-11

## 2020-11-02 MED ADMIN — cefepime (MAXIPIME) 2 g in sodium chloride 0.9 % (NS) 100 mL IVPB-connector bag: 2 g | INTRAVENOUS | @ 01:00:00 | Stop: 2020-11-08

## 2020-11-02 MED ADMIN — valACYclovir (VALTREX) tablet 500 mg: 500 mg | GASTROENTERAL | @ 13:00:00

## 2020-11-02 MED ADMIN — chlorhexidine (PERIDEX) 0.12 % solution 5 mL: 5 mL | TOPICAL | @ 12:00:00

## 2020-11-02 MED ADMIN — heparin (porcine) 5,000 unit/mL injection 5,000 Units: 5000 [IU] | SUBCUTANEOUS | @ 17:00:00

## 2020-11-02 MED ADMIN — ipratropium-albuteroL (DUO-NEB) 0.5-2.5 mg/3 mL nebulizer solution 3 mL: 3 mL | RESPIRATORY_TRACT | @ 15:00:00

## 2020-11-02 MED ADMIN — fentaNYL (PF) (SUBLIMAZE) injection 50 mcg: 50 ug | INTRAVENOUS | @ 13:00:00 | Stop: 2020-11-15

## 2020-11-02 MED ADMIN — chlorhexidine (PERIDEX) 0.12 % solution 5 mL: 5 mL | TOPICAL

## 2020-11-02 MED ADMIN — sodium bicarbonate tablet 1,300 mg: 1300 mg | GASTROENTERAL | @ 21:00:00

## 2020-11-02 MED ADMIN — acetaminophen (TYLENOL) tablet 1,000 mg: 1000 mg | GASTROENTERAL | @ 04:00:00 | Stop: 2020-12-01

## 2020-11-02 MED ADMIN — sulfamethoxazole-trimethoprim (BACTRIM DS) 800-160 mg tablet 160 mg of trimethoprim: 1 | GASTROENTERAL | @ 13:00:00 | Stop: 2020-12-02

## 2020-11-02 MED ADMIN — isavuconazonium sulfate (CRESEMBA) capsule 372 mg: 372 mg | ORAL | @ 13:00:00

## 2020-11-02 MED ADMIN — ipratropium-albuteroL (DUO-NEB) 0.5-2.5 mg/3 mL nebulizer solution 3 mL: 3 mL | RESPIRATORY_TRACT | @ 09:00:00

## 2020-11-02 MED ADMIN — fentaNYL PF (SUBLIMAZE) (50 mcg/mL) infusion (bag): 0-200 ug/h | INTRAVENOUS | @ 13:00:00 | Stop: 2020-11-15

## 2020-11-02 MED ADMIN — acetaminophen (TYLENOL) tablet 1,000 mg: 1000 mg | GASTROENTERAL | @ 20:00:00 | Stop: 2020-12-01

## 2020-11-02 MED ADMIN — aspirin chewable tablet 81 mg: 81 mg | GASTROENTERAL | @ 13:00:00

## 2020-11-02 MED ADMIN — heparin (porcine) 5,000 unit/mL injection 5,000 Units: 5000 [IU] | SUBCUTANEOUS | @ 09:00:00

## 2020-11-02 MED ADMIN — nortriptyline (PAMELOR) capsule 75 mg: 75 mg | ORAL | @ 01:00:00

## 2020-11-02 MED ADMIN — sodium bicarbonate tablet 1,300 mg: 1300 mg | GASTROENTERAL | @ 15:00:00

## 2020-11-02 MED ADMIN — famotidine (PEPCID) tablet 20 mg: 20 mg | GASTROENTERAL | @ 15:00:00

## 2020-11-02 MED ADMIN — sodium bicarbonate tablet 1,300 mg: 1300 mg | GASTROENTERAL | @ 03:00:00

## 2020-11-02 MED ADMIN — propofol (DIPRIVAN) infusion 10 mg/mL: 0-80 ug/kg/min | INTRAVENOUS | @ 02:00:00

## 2020-11-02 MED ADMIN — linezolid in dextrose 5% (ZYVOX) 600 mg/300 mL IVPB 600 mg: 600 mg | INTRAVENOUS | @ 13:00:00 | Stop: 2020-11-11

## 2020-11-02 MED ADMIN — ipratropium-albuteroL (DUO-NEB) 0.5-2.5 mg/3 mL nebulizer solution 3 mL: 3 mL | RESPIRATORY_TRACT | @ 21:00:00

## 2020-11-02 MED ADMIN — gabapentin (NEURONTIN) capsule 300 mg: 300 mg | GASTROENTERAL | @ 01:00:00

## 2020-11-02 MED ADMIN — norepinephrine 8 mg in dextrose 5 % 250 mL (32 mcg/mL) infusion PMB: 0-30 ug/min | INTRAVENOUS | @ 17:00:00

## 2020-11-02 MED ADMIN — gabapentin (NEURONTIN) capsule 300 mg: 300 mg | GASTROENTERAL | @ 17:00:00

## 2020-11-02 MED ADMIN — oxyCODONE (ROXICODONE) immediate release tablet 10 mg: 10 mg | GASTROENTERAL | @ 09:00:00 | Stop: 2020-11-15

## 2020-11-02 MED ADMIN — sodium bicarbonate tablet 1,300 mg: 1300 mg | GASTROENTERAL | @ 09:00:00

## 2020-11-02 MED ADMIN — ipratropium-albuteroL (DUO-NEB) 0.5-2.5 mg/3 mL nebulizer solution 3 mL: 3 mL | RESPIRATORY_TRACT | @ 02:00:00

## 2020-11-02 MED ADMIN — propofol (DIPRIVAN) infusion 10 mg/mL: 0-80 ug/kg/min | INTRAVENOUS | @ 06:00:00 | Stop: 2020-11-02

## 2020-11-02 MED ADMIN — melatonin tablet 3 mg: 3 mg | GASTROENTERAL | @ 21:00:00

## 2020-11-02 MED ADMIN — predniSONE (DELTASONE) tablet 20 mg: 20 mg | GASTROENTERAL | @ 23:00:00

## 2020-11-02 MED ADMIN — polyethylene glycol (MIRALAX) packet 17 g: 17 g | GASTROENTERAL | @ 13:00:00

## 2020-11-02 MED ADMIN — cefepime (MAXIPIME) 2 g in sodium chloride 0.9 % (NS) 100 mL IVPB-connector bag: 2 g | INTRAVENOUS | @ 13:00:00 | Stop: 2020-11-02

## 2020-11-02 MED ADMIN — oxyCODONE (ROXICODONE) immediate release tablet 10 mg: 10 mg | GASTROENTERAL | @ 15:00:00 | Stop: 2020-11-15

## 2020-11-02 MED ADMIN — oxyCODONE (ROXICODONE) immediate release tablet 10 mg: 10 mg | GASTROENTERAL | @ 21:00:00 | Stop: 2020-11-15

## 2020-11-02 MED ADMIN — DAPTOmycin (CUBICIN) 1,150 mg in sodium chloride (NS) 0.9 % 50 mL IVPB: 10 mg/kg | INTRAVENOUS | @ 21:00:00 | Stop: 2020-11-15

## 2020-11-02 MED ADMIN — polyethylene glycol (MIRALAX) packet 17 g: 17 g | GASTROENTERAL | @ 01:00:00

## 2020-11-02 MED ADMIN — propofol (DIPRIVAN) infusion 10 mg/mL: 0-80 ug/kg/min | INTRAVENOUS | @ 10:00:00 | Stop: 2020-11-02

## 2020-11-02 MED ADMIN — propofol (DIPRIVAN) infusion 10 mg/mL: 0-80 ug/kg/min | INTRAVENOUS | @ 14:00:00 | Stop: 2020-11-02

## 2020-11-02 MED ADMIN — senna (SENOKOT) tablet 2 tablet: 2 | ORAL | @ 01:00:00

## 2020-11-02 MED ADMIN — heparin (porcine) 5,000 unit/mL injection 5,000 Units: 5000 [IU] | SUBCUTANEOUS | @ 01:00:00

## 2020-11-02 MED ADMIN — acetaminophen (TYLENOL) tablet 1,000 mg: 1000 mg | GASTROENTERAL | @ 11:00:00 | Stop: 2020-12-01

## 2020-11-02 MED ADMIN — magnesium sulfate 2gm/50mL IVPB: 2 g | INTRAVENOUS | @ 04:00:00

## 2020-11-02 NOTE — Unmapped (Signed)
Patient Summary: No adverse events throughout the night. Patient intubated and sedated. Family at bedside throughout the night.  Neuro: UTA. Patient on sedation. See MAR. Patient localizes to pain  Cardiac: NSR HR 80s  Respiratory: intubated. PRVC FiO2 90% PEEP 12  GU: TF to up titrate   GI: Foley draining to BSB  Problem: Adult Inpatient Plan of Care  Goal: Plan of Care Review  Outcome: Progressing  Goal: Patient-Specific Goal (Individualized)  Outcome: Progressing  Goal: Absence of Hospital-Acquired Illness or Injury  Outcome: Progressing  Intervention: Identify and Manage Fall Risk  Recent Flowsheet Documentation  Taken 11/01/2020 2000 by Arlyn Dunning, RN BSN  Safety Interventions:   aspiration precautions   enteral feeding safety   environmental modification   fall reduction program maintained   family at bedside   lighting adjusted for tasks/safety   low bed   chemotherapeutic agent precautions  Intervention: Prevent Skin Injury  Recent Flowsheet Documentation  Taken 11/01/2020 2000 by Arlyn Dunning, RN BSN  Skin Protection:   adhesive use limited   incontinence pads utilized   pulse oximeter probe site changed   skin-to-device areas padded   skin-to-skin areas padded   transparent dressing maintained   tubing/devices free from skin contact  Intervention: Prevent and Manage VTE (Venous Thromboembolism) Risk  Recent Flowsheet Documentation  Taken 11/02/2020 0600 by Arlyn Dunning, RN BSN  Activity Management: bedrest  Taken 11/02/2020 0400 by Arlyn Dunning, RN BSN  Activity Management: bedrest  Taken 11/02/2020 0200 by Arlyn Dunning, RN BSN  Activity Management: bedrest  Taken 11/02/2020 0000 by Arlyn Dunning, RN BSN  Activity Management: bedrest  Taken 11/01/2020 2200 by Arlyn Dunning, RN BSN  Activity Management: bedrest  Taken 11/01/2020 2000 by Arlyn Dunning, RN BSN  Activity Management: bedrest  Goal: Optimal Comfort and Wellbeing  Outcome: Progressing  Goal: Readiness for Transition of Care  Outcome: Progressing  Goal: Rounds/Family Conference  Outcome: Progressing     Problem: Infection  Goal: Absence of Infection Signs and Symptoms  Outcome: Progressing     Problem: Fluid Imbalance (Pneumonia)  Goal: Fluid Balance  Outcome: Progressing     Problem: Infection (Pneumonia)  Goal: Resolution of Infection Signs and Symptoms  Outcome: Progressing     Problem: Respiratory Compromise (Pneumonia)  Goal: Effective Oxygenation and Ventilation  Outcome: Progressing  Intervention: Optimize Oxygenation and Ventilation  Recent Flowsheet Documentation  Taken 11/02/2020 0600 by Arlyn Dunning, RN BSN  Head of Bed [facility]) Positioning: HOB at 30 degrees  Taken 11/02/2020 0400 by Arlyn Dunning, RN BSN  Head of Bed [facility]) Positioning: HOB at 30 degrees  Taken 11/02/2020 0200 by Arlyn Dunning, RN BSN  Head of Bed [facility]) Positioning: HOB at 30 degrees  Taken 11/02/2020 0000 by Arlyn Dunning, RN BSN  Head of Bed [facility]) Positioning: HOB at 30 degrees  Taken 11/01/2020 2200 by Arlyn Dunning, RN BSN  Head of Bed [facility]) Positioning: HOB at 30 degrees  Taken 11/01/2020 2000 by Arlyn Dunning, RN BSN  Head of Bed [facility]) Positioning: HOB at 30 degrees     Problem: Fall Injury Risk  Goal: Absence of Fall and Fall-Related Injury  Outcome: Progressing  Intervention: Promote Injury-Free Environment  Recent Flowsheet Documentation  Taken 11/01/2020 2000 by Arlyn Dunning, RN BSN  Safety Interventions:   aspiration precautions   enteral feeding safety   environmental modification   fall reduction program maintained   family at bedside  lighting adjusted for tasks/safety   low bed   chemotherapeutic agent precautions     Problem: Skin Injury Risk Increased  Goal: Skin Health and Integrity  Outcome: Progressing  Intervention: Optimize Skin Protection  Recent Flowsheet Documentation  Taken 11/02/2020 0600 by Arlyn Dunning, RN BSN  Head of Bed [facility]) Positioning: HOB at 30 degrees  Taken 11/02/2020 0400 by Arlyn Dunning, RN BSN  Head of Bed [facility]) Positioning: HOB at 30 degrees  Taken 11/02/2020 0200 by Arlyn Dunning, RN BSN  Head of Bed [facility]) Positioning: HOB at 30 degrees  Taken 11/02/2020 0000 by Arlyn Dunning, RN BSN  Head of Bed [facility]) Positioning: HOB at 30 degrees  Taken 11/01/2020 2200 by Arlyn Dunning, RN BSN  Head of Bed [facility]) Positioning: HOB at 30 degrees  Taken 11/01/2020 2000 by Arlyn Dunning, RN BSN  Pressure Reduction Techniques:   heels elevated off bed   pressure points protected   weight shift assistance provided  Head of Bed (HOB) Positioning: HOB at 30 degrees  Pressure Reduction Devices: pressure-redistributing mattress utilized  Skin Protection:   adhesive use limited   incontinence pads utilized   pulse oximeter probe site changed   skin-to-device areas padded   skin-to-skin areas padded   transparent dressing maintained   tubing/devices free from skin contact     Problem: Self-Care Deficit  Goal: Improved Ability to Complete Activities of Daily Living  Outcome: Progressing     Problem: Impaired Wound Healing  Goal: Optimal Wound Healing  Outcome: Progressing  Intervention: Promote Wound Healing  Recent Flowsheet Documentation  Taken 11/02/2020 0600 by Arlyn Dunning, RN BSN  Activity Management: bedrest  Taken 11/02/2020 0400 by Arlyn Dunning, RN BSN  Activity Management: bedrest  Taken 11/02/2020 0200 by Arlyn Dunning, RN BSN  Activity Management: bedrest  Taken 11/02/2020 0000 by Arlyn Dunning, RN BSN  Activity Management: bedrest  Taken 11/01/2020 2200 by Arlyn Dunning, RN BSN  Activity Management: bedrest  Taken 11/01/2020 2000 by Arlyn Dunning, RN BSN  Activity Management: bedrest     Problem: Non-Violent Restraints  Goal: Patient will remain free of restraint events  Outcome: Progressing  Goal: Patient will remain free of physical injury  Outcome: Progressing  Intervention: Utilize least restrictive measures  Recent Flowsheet Documentation  Taken 11/02/2020 0600 by Arlyn Dunning, RN BSN  Less Restrictive Alternative: Repositioning   Comfort Measures   Family/visitor at bedside   Pain management  Taken 11/02/2020 0400 by Arlyn Dunning, RN BSN  Less Restrictive Alternative:   Repositioning   Comfort Measures   Family/visitor at bedside   Pain management  Taken 11/02/2020 0200 by Arlyn Dunning, RN BSN  Less Restrictive Alternative:   Repositioning   Comfort Measures   Pain management  Taken 11/02/2020 0000 by Arlyn Dunning, RN BSN  Less Restrictive Alternative:   Repositioning   Comfort Measures   Pain management  Taken 11/01/2020 2200 by Arlyn Dunning, RN BSN  Less Restrictive Alternative:   Repositioning   Comfort Measures   Family/visitor at bedside   Pain management  Taken 11/01/2020 2000 by Arlyn Dunning, RN BSN  Less Restrictive Alternative:   Repositioning   Pain management   Comfort Measures  Intervention: Patient Monitoring  Recent Flowsheet Documentation  Taken 11/02/2020 0600 by Arlyn Dunning, RN BSN  Psychological Status/Visual Check: Subdued  Circulation/Skin Integrity: No signs of injury  Range of Motion: Performed  Fluids:  IV fluids  Food/Meal: Enteral feeding/TPN  Elimination: Urinary catheter  Taken 11/02/2020 0400 by Arlyn Dunning, RN BSN  Psychological Status/Visual Check: Subdued  Circulation/Skin Integrity: No signs of injury  Range of Motion: Performed  Fluids: IV fluids  Food/Meal: Enteral feeding/TPN  Elimination: Urinary catheter  Taken 11/02/2020 0200 by Arlyn Dunning, RN BSN  Psychological Status/Visual Check: Subdued  Circulation/Skin Integrity: No signs of injury  Range of Motion: Performed  Fluids: IV fluids  Food/Meal: Enteral feeding/TPN  Elimination: Urinary catheter  Taken 11/02/2020 0000 by Arlyn Dunning, RN BSN  Psychological Status/Visual Check: Subdued  Circulation/Skin Integrity: No signs of injury  Range of Motion: Performed  Fluids: IV fluids  Food/Meal: Enteral feeding/TPN  Elimination: Urinary catheter  Taken 11/01/2020 2200 by Arlyn Dunning, RN BSN  Psychological Status/Visual Check: Subdued  Circulation/Skin Integrity: No signs of injury  Range of Motion: Performed  Fluids: IV fluids  Food/Meal: Enteral feeding/TPN  Elimination: Urinary catheter  Taken 11/01/2020 2000 by Arlyn Dunning, RN BSN  Psychological Status/Visual Check: Subdued  Circulation/Skin Integrity: No signs of injury  Range of Motion: Performed  Fluids: IV fluids  Food/Meal: Enteral feeding/TPN  Elimination: Urinary catheter  Intervention: Patient Education  Recent Flowsheet Documentation  Taken 11/01/2020 2000 by Arlyn Dunning, RN BSN  Criteria Explained: Yes  Patient's Response: Patient Unable to respond  Family Notification: (Father at bedside)   Other   Spouse/significant other     Problem: Hypertension Comorbidity  Goal: Blood Pressure in Desired Range  Outcome: Progressing     Problem: Obstructive Sleep Apnea Risk or Actual Comorbidity Management  Goal: Unobstructed Breathing During Sleep  Outcome: Progressing     Problem: Communication Impairment (Mechanical Ventilation, Invasive)  Goal: Effective Communication  Outcome: Progressing     Problem: Device-Related Complication Risk (Mechanical Ventilation, Invasive)  Goal: Optimal Device Function  Outcome: Progressing  Intervention: Optimize Device Care and Function  Recent Flowsheet Documentation  Taken 11/01/2020 2000 by Arlyn Dunning, RN BSN  Aspiration Precautions:   respiratory status monitored   tube feeding placement verified   upright posture maintained     Problem: Inability to Wean (Mechanical Ventilation, Invasive)  Goal: Mechanical Ventilation Liberation  Outcome: Progressing     Problem: Nutrition Impairment (Mechanical Ventilation, Invasive)  Goal: Optimal Nutrition Delivery  Outcome: Progressing     Problem: Skin and Tissue Injury (Mechanical Ventilation, Invasive)  Goal: Absence of Device-Related Skin and Tissue Injury  Outcome: Progressing  Intervention: Maintain Skin and Tissue Health  Recent Flowsheet Documentation  Taken 11/01/2020 2000 by Arlyn Dunning, RN BSN  Device Skin Pressure Protection:   absorbent pad utilized/changed   adhesive use limited   pressure points protected   skin-to-device areas padded     Problem: Ventilator-Induced Lung Injury (Mechanical Ventilation, Invasive)  Goal: Absence of Ventilator-Induced Lung Injury  Outcome: Progressing  Intervention: Prevent Ventilator-Associated Pneumonia  Recent Flowsheet Documentation  Taken 11/02/2020 0600 by Arlyn Dunning, RN BSN  Head of Bed [facility]) Positioning: HOB at 30 degrees  Oral Care:   lip/mouth moisturizer applied   mouth swabbed   oral rinse provided   suction provided  Taken 11/02/2020 0400 by Arlyn Dunning, RN BSN  Head of Bed [facility]) Positioning: HOB at 30 degrees  Oral Care:   lip/mouth moisturizer applied   mouth swabbed   oral rinse provided   suction provided  Suction Type: Oral  Taken 11/02/2020 0200 by Arlyn Dunning, RN BSN  Head of Bed [facility])  Positioning: HOB at 30 degrees  Oral Care:   lip/mouth moisturizer applied   mouth swabbed   oral rinse provided   suction provided  Taken 11/02/2020 0000 by Arlyn Dunning, RN BSN  Head of Bed [facility]) Positioning: HOB at 30 degrees  Oral Care:   lip/mouth moisturizer applied   mouth swabbed   oral rinse provided  Suction Type: Oral  Taken 11/01/2020 2200 by Arlyn Dunning, RN BSN  Head of Bed [facility]) Positioning: HOB at 30 degrees  Oral Care:   lip/mouth moisturizer applied   mouth swabbed   oral rinse provided   suction provided  Taken 11/01/2020 2000 by Arlyn Dunning, RN BSN  Head of Bed [facility]) Positioning: HOB at 30 degrees  Oral Care:   lip/mouth moisturizer applied   mouth swabbed   oral rinse provided   suction provided   teeth brushed   tongue brushed  Suction Type: Subglottic

## 2020-11-02 NOTE — Unmapped (Signed)
Dermatology Inpatient Consult Note    Requesting Attending Physician :  Adam Keith, *  Service Requesting Consult : Medical ICU (MDI)  Consulting Attending Physician: Adam Curb MD    Reason for Consult: [name] is seen in consultation today by Adam Curb MD at the request of Dr. Maurilio Lovely Chancy Keith, * of the Medical ICU (MDI) service for evaluation of erythematous rash on the chest.    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 DIHS/DRESS can encompass a spectrum of findings including morbilliform rash, fever, lymphadenopathy, and end-organ damage. It is important to note that there are no specific diagnostic tests to confirm a diagnosis of DRESS/DIHS; it is a diagnosis of exclusion. Notably, skin biopsy cannot reliably differentiate between a simple/morbilliform drug eruption and DIHS/DRESS.   - Prior H&E from outpatient dermatology visit on 3/11 consistent with an eczematous drug reaction. Given his elevated eosinophil count, concern was for DRESS at that time. He had a calculated RegiSCAR score of 4. Possible drug culprits include vancomycin and multiple cephalosporins, specifically ceftaroline.  - Patient was on a prednisone taper for known DRESS. His most recently tolerated dose was prednisone 15mg  which was last taken on 7/30.  - Patient is now flaring with erythema of the neck, chest, and knees and absolute eosinophils up from 0 on 7/31 to 1.1 on 8/1. Rash looks similar to rash patient had when he was first diagnosed with DRESS. This is likely due to missing about 48 hrs of prednisone and possibly due to initiation of cefepime and vancomycin for his pneumonia with MRSA bacteremia.  - Unclear if worsening kidney function is associated with flaring DRESS or critical illness / pre-renal.    Recommendations:    ?? Avoid cephalosporins and vancomycin and list as severe allergy. Please discuss alternative therapies w/ /ID.  ?? Recommend prednisone 20 mg daily.  ?? Recommend cake-layer frosting of triamcinolone 0.1% ointment twice daily to erythematous areas (knees, chest, neck); please order 454g jar of ointment to bedside.  ?? Please trend CBC w/diff and CMP daily so we can follow eos and LFTs.  ?? Please check HHV6, CMV, and EBV serum PCRs, as viral reactivation can be triggering factor for DIHS/DRESS.  ?? Should hypotension occur, consider checking AM cortisol to rule out adrenal insufficiency, as this is also associated with DIHS/DRESS.  ?? Recommend checking TSH and Free T4 to rule out eosinophilic infiltration of thryoid.    Thank you for the consult. We will continue to follow. Please page [phone] with any questions or concerns.  ______________________________________________________________________    History of Present Illness: :  Adam 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 admitted on 11/01/2020 for Respiratory distress [R06.03] and was found to have Pneumonia and MRSA bacteremia.    We have been consulted to evaluate erythematous rash on the chest.      Dermatology is familiar with this patient.  We have previously seen him in the outpatient and inpatient setting for DRESS thought to be due to vancomycin, ceftaroline or other drug exposure.  We most recently saw this patient on 08/13/2020.  At that time, patient was continued on a prednisone taper.  He was advised to take 15 mg daily for 4 weeks  then taper to 10 mg daily if cleared.  Patient flared when he decreased to 10 mg daily so went back up to 15 mg.  Per wife, patient has had no erythematous rash since then.  His most recent dose of prednisone 15 mg was on 10/31/2020. He has not received any steroids this hospitalization and has been on several antibiotics including cefepime and vancomycin. Today, wife noticed erythema on the chest.  During her exam, we asked about baseline skin tone and she reported that face looks normal, but erythema on the neck, chest, and knees is new.    No other complaints today.    Allergies:  Bupropion hcl, Ceftaroline fosamil, Dapsone, Onion, Vancomycin analogues, Bismuth subsalicylate, and Furosemide    Medications:   Current medication list reviewed in Epic.    Medical History: reviewed in Epic.  Past Medical History:   Diagnosis Date   ??? 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 Tobacco use disorder   ??? AKI (acute kidney injury) (CMS-HCC)   ??? Transaminitis   ??? 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 Hyperphosphatemia   ??? MRSA bacteremia   ??? Septic shock due to Staphylococcus aureus (CMS-HCC)   ??? Hyperkalemia   ??? Hiccups   ??? Hypernatremia   ??? Red blood cell antibody positive   ??? Hypokalemia   ??? Hypomagnesemia   ??? COVID-19   ??? Dyspnea   ??? Recurrent major depressive disorder, in partial remission (CMS-HCC)   ??? PTSD (post-traumatic stress disorder)   ??? Anxiety   ??? Insomnia   ??? Fever   ??? Immunocompromised (CMS-HCC)   ??? Hypogammaglobulinemia (CMS-HCC)   ??? ALL (acute lymphoblastic leukemia) (CMS-HCC)   ??? Respiratory failure with hypoxia (CMS-HCC)   ??? Pneumonia   ??? Chronic pain       Social History:  Lives in [place] [id number]    Family History: reviewed in Epic.    Review of Systems:  Unable to obtain due to patient's status.    Objective: :    Vitals:  Vitals:    11/02/20 1400 11/02/20 1406 11/02/20 1500 11/02/20 1600   BP:  137/81     Pulse: 104 101 93 88   Resp: 15 16 26 26    Temp:       TempSrc:       SpO2: 93% 93% 93% 91%   Weight:       Height:  188 cm (6' 2.02)         Physical Exam:  GEN: ill appearing, currently intubated and sedated  NEURO: sedated  SKIN: Examination with inspection and palpation of the head, neck, chest,  abdomen,  right upper extremity, left upper extremity, right lower extremity, left lower extremity, was performed and notable for the following. All other areas examined were normal or had no significant findings.  - Patient has a ruddy appearance at baseline, per wife his face and rosy cheeks are normal  - Per wife, the erythematous patches on the neck, chest, and extensor knees are new    Labs / Studies:    Blood Culture 1/2: MRSA  Absolute eosinophils 1.1    Recent Labs   Lab Units 11/02/20  1602 11/02/20  1114 11/02/20  0827 11/02/20  0510 11/01/20  1646 11/01/20  1547 11/01/20  1322 11/01/20  0628 11/01/20  0148   SODIUM WHOLE  BLOOD mmol/L 132* 134* 136 133*   < >  --    < >  --   --    SODIUM mmol/L  --   --   --  134*  --  134*  --  132* 135   POTASSIUM WHOLE BLOOD mmol/L 4.0 3.5 3.4 4.3   < >  --    < >  --   --    POTASSIUM mmol/L  --   --   --  4.5  --  4.8  --  4.4 4.4 - 4.4   CHLORIDE mmol/L  --   --   --  106  --  109*  --  102 104   CO2 mmol/L  --   --   --  20.0  --  19.0*  --  22.0 22.0   BUN mg/dL  --   --   --  42*  --  36*  --  22 27*   CREATININE mg/dL  --   --   --  6.21*  --  2.44*  --  2.40* 2.46*   GLUCOSE mg/dL  --   --   --  308  --  110  --  104* 108   CALCIUM mg/dL  --   --   --  8.3*  --  7.5*  --  8.9 9.8   ALBUMIN g/dL  --   --   --  2.2*  --   --   --  3.2* 3.8   PROTEIN TOTAL g/dL  --   --   --  4.9*  --   --   --  6.4 7.4   BILIRUBIN TOTAL mg/dL  --   --   --  0.2*  --   --   --  0.3 0.5   ALK PHOS U/L  --   --   --  87  --   --   --  93 104   ALT U/L  --   --   --  27  --   --   --  48 61*   AST U/L  --   --   --  9  --   --   --  16 21    < > = values in this interval not displayed.

## 2020-11-02 NOTE — Unmapped (Signed)
Adult Nutrition Assessment Note    Visit Type: MD Consult  Reason for Visit: Enteral Nutrition      HPI & PMH:  Per provider notes:  40 y.o. male with PH+ B-ALL, COPD, hospitalization 07/2020 w/ MRSA bacteremia, Candida Krusei fungemia, active smoking, sp Covid infection 09/2020 followed by [facility] who presented to [facility] with worsening SOB x48 hours. Of note pt hospitalized 08/2020 for induction therapy that was complicated by Septic Shock due to MRSA and Candida krusei fungemia, acute renal failure necessitating temporary iHD and AHRB requiring intubation and stay in MICU.     Anthropometric Data:  Height: 188 cm (6' 2.02)   Admission weight: (!) 113.4 kg (250 lb)  Last recorded weight: (!) 113.4 kg (250 lb)  IBW: 86.27 kg  Percent IBW: 131.51 %  BMI: Body mass index is 32.08 kg/m??.   Usual Body Weight: Unable to obtain at this time     Weight history prior to admission: Noted weight loss last year during prolonged hospital stay.  Low weight of 89 kg on 04/01/20. Weight has steadily trended up since this time to current weight.     Wt Readings from Last 10 Encounters:   10/31/20 (!) 113.4 kg (250 lb)   10/27/20 (!) 114.3 kg (251 lb 15.8 oz)   10/20/20 (!) 114.4 kg (252 lb 4.8 oz)   10/13/20 (!) 111.5 kg (245 lb 14.4 oz)   10/13/20 (!) 111.5 kg (245 lb 14.4 oz)   10/06/20 (!) 110.1 kg (242 lb 11.2 oz)   09/21/20 (!) 112.9 kg (248 lb 14.4 oz)   09/21/20 (!) 112.9 kg (248 lb 14.4 oz)   09/18/20 (!) 112.4 kg (247 lb 12.8 oz)   09/09/20 (!) 112.8 kg (248 lb 11.2 oz)        Weight changes this admission:   Last 5 Recorded Weights    10/31/20 2330   Weight: (!) 113.4 kg (250 lb)        Nutrition Focused Physical Exam:  Unable to complete at this time due to patient's clinical condition       NUTRITIONALLY RELEVANT DATA     Medications:   Nutritionally pertinent medications reviewed and evaluated for potential food and/or medication interactions.    Oxycodone, miralax BID, senokot,  Propofol @ 6.8 ml/hr (180 kcals), fentanyl    Labs:     Results in Past 7 Days  Result Component Current Result   Sodium 134 (L) (11/02/2020)   Potassium 4.5 (11/02/2020)   Chloride 106 (11/02/2020)   CO2 20.0 (11/02/2020)   BUN 42 (H) (11/02/2020)   Creatinine 2.74 (H) (11/02/2020)   Glucose 115 (11/02/2020)   Calcium 8.3 (L) (11/02/2020)   Calcium, Ionized Arterial 4.26 (L) (11/02/2020)   Magnesium 2.2 (11/02/2020)   Phosphorus 5.3 (H) (11/02/2020)   Albumin 2.2 (L) (11/02/2020)   Total Bilirubin 0.2 (L) (11/02/2020)   AST 9 (11/02/2020)   ALT 27 (11/02/2020)   Alkaline Phosphatase 87 (11/02/2020)   Triglycerides 817 (H) (11/02/2020)   Glucose, POC 123 (11/02/2020)       Nutrition History:   November 02, 2020: Prior to admission: Patient unable to provide at this time. Starting tube feeds.  Currently on Vital High Protein @ 20 ml/hr with some increased residuals (220 mls)    Allergies, Intolerances, Sensitivities, and/or Cultural/Religious Dietary Restrictions: include onions.     Current Nutrition:  Enteral nutrition via NG tube       Nutrition Orders   (From admission, onward)  Start     Ordered    11/01/20 1510  Adult Enteral Nutrition Vital High Protein (Critical Care Low Cal/HP)  (Adult Enteral Nutrition + Nutrition Consult Panel)  Effective now        Comments: (Adult Enteral Nutrition After Hours in Critical Care Areas)  Enter / Use Only the following Products:  Promote ([place]/HBR)  Vital High Protein (PRDH/CLDH/JHH/JHCH/NSHH/WAYH)  Vital AF (CHT)   Question Answer Comment   Enteral Nutrition Formula: Vital High Protein (Critical Care Low Cal/HP)    Feeding Route: NG Tube    Enteral Nutrition Continuous initial rate (mL/hr): 20    Enteral Nutrition Increasing goal rate 10 q4    Final Goal (mL/hr) 50    Feeding Tube Flush (mL) 30 mL    Feeding Tube Flush Type: Water    Feeding Tube Flush Frequency: Every 4 hours        11/01/20 1511    11/01/20 1353  NPO No Exceptions; Medically necessary  Effective now        Question Answer Comment   NPO Except: No Exceptions Reason: Medically necessary        11/01/20 1353                   Nutritional Needs:   Daily Estimated Nutrient Needs:  Energy: 1243-1582 kcals 11-14 kcal/kg (per ASPEN/SCCM guidelines for the critically ill obese, BMI 30-50) using admission body weight, 113 kg (11/02/20 1431)]  Protein: 173 gm [> 2 gm/kg (per ASPEN/SCCM guidelines for the critically ill obese, BMI 30-39.9) using ideal body weight, 86 kg (11/02/20 1431)]  Carbohydrate:   [no restriction]  Fluid:   mL [per MD team]    As compared to [facility] for mechanically vented pts [place] using the following minute volume/temperature: 13.5/37.7  ~2525 kcal/day    Will aim to feed between above calculations.     Malnutrition assessment not yet completed at this time due lack of nutrition history and inability to complete nutrition focused physical exam (NFPE).     GOALS and EVALUATION     ??? Patient to meet 65% or greater of nutritional needs via enteral nutrition within the first week of ICU admission. - New    Motivation, Barriers, and Compliance:  Evaluation of motivation, barriers, and compliance pending at this time due to clinical status.     NUTRITION ASSESSMENT     ??? Patient appropriate for enteral nutrition support to meet nutritional needs given respiratory failure requiring mechanical ventilation.   ??? Current propofol at 6.8 ml/hr providing 180 kcals from fat. No adjustment to tube feeding goal needed as propofol providing insignificant kcalories and unlikely to cause respiratory compromise with concurrent use.    ??? Patient appropriate for low electrolyte enteral nutrition with worsening AKI, PO4 5.3 and K 4.5   ??? Unable to meet protein needs on renal tube feed product.      Discharge Planning:   Monitor for potential discharge needs with multi-disciplinary team.       NUTRITION INTERVENTIONS and RECOMMENDATION     1.      Recommend Novasource Renal at goal rate 50 mL/hr. This provides 2100 kcals, 96 g protein, 193 g carbohydrate, 105 g fat, and 756 mL free water.  2. Free water flush per team, minimum 30 mls Q 4 hours.   3. RD monitoring lytes for appropriate tube feed provision.    4. Recommend discontinue propofol due to elevated triglyceride levels.   5. Weekly weights.  Follow-Up Parameters:   1-2 times per week (and more frequent as indicated)    Joesphine Bare, RDN, LDN, CNSC  Pager [phone]          {

## 2020-11-02 NOTE — Unmapped (Signed)
MICU Evening Summary     Date of Service: 11/01/2020    Interval History: Adam 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 He presented 7/31 to ED w two days of SOB and tachycardia c/f sepsis w new RLL consolidation (recent CT negative 3d pta). Critical care services are indicated for:    Principal Problem:    Pneumonia  Active Problems:    Tobacco use disorder    AKI (acute kidney injury) (CMS-HCC)    Recurrent major depressive disorder, in partial remission (CMS-HCC)    Hypogammaglobulinemia (CMS-HCC)    ALL (acute lymphoblastic leukemia) (CMS-HCC)    Respiratory failure with hypoxia (CMS-HCC)    Chronic pain  Resolved Problems:    * No resolved hospital problems. *        Assessment & Plan     N: cont home gaba, elavil, baseline deaf Rt ear s/p lasix/vanc, fent/prop gtts for RASS 2- to 3-, scheduled oxy/apap for pleuritic chest pain and CBP, sedation needed for vent compliance,   P: bipap prior to intubation w persistent hypoxia and inc WOB, intubated 7/31 significant derecruitment (desat to 70s) s/p RSI w reduced BBS and air trapping >> dec I time, peep and RR, Slow to re-recruit and desats w HOB flat and turn to Right (shunt phys), cont duoneb scheduled, hold steroids for copd  CV: levo added since RSI, MAP>65, tachycard improved w fluid resusc, formal echo pending  GI: tube feed started, adv as tol to goal, cont bowel regimen  R:s/p 2L LR for metab acidosis in setting of AKI, bicarb added, of note previously on pred 15daily for DRESS   H: heparin ppx, pvl pending,   E: accu checks  Onc: hold chemo infusion for now,   ID: ppx w cresemba, bactrim, valtrex, cont linezolid, mero, mica >> changed mero to cefepime and added azithr per ICID recs, prelim cx w GPC bacteremia and pna, MRSA screen pending.*    Critical Care Attestation     This patient is critically ill or injured with the impairment of vital organ systems such that there is a high probability of imminent or life threatening deterioration in the patient's condition. This patient must remain in the ICU for ongoing evaluation of the comprehensive management plan outlined in this note. I directly provided critical care services as documented in this note and the critical care time spent (55  min) is exclusive of separately billable procedures.  In addition to time spent for critical care management, I also provided advance care planning services for 0  minutes (see ACP note for details)???. Total billable critical care time 55  minutes.    Thara Searing Fonnie Mu, ACNP

## 2020-11-02 NOTE — Unmapped (Signed)
Malignant Hematology Consult Note    Requesting Attending Physician :  Gean Maidens, *  Service Requesting Consult : Medical ICU (MDI)  Reason for Consult: B-ALL  Primary Oncologist: [name]    Assessment: Adam Keith is a 40 y.o. 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 Malignant hematology was consulted for B-ALL.     Patient presented with new onset SOB, productive cough, and chills as well as right sided chest pain. HE did not report any altered mental status or neurologic changes.  CXR in ED with RLL opacity concerning for pneumonia. His respiratory status declined and he was emergently intubated in MICU.  Febrile to 38.9.  WBC elevated to 38.7 with 90+% neutrophils. Presentation most consistent with sepsis from pulmonary source.  Also on differential but considered significantly less likely CRS is related to blinatumomab given timing (D27).  Had similar presentation in April with suspected MRSA vs fungal pneumonia. Minimal concern for CRS and/or neurotoxicity r/t blinatumomab. No indication for toci or steroids.    He remains in tenuous condition presently requiring mechanical ventilation and vasopressors, which is likely driven by sepsis rather than ALL. That said, oncologic control is important in this younger gentleman. Will continue to hold blinatumomab and ponatinib. Ideally, will plan to restart ponatinib as able pending clinical status and enteral access given that he is MRD positive (by BCR-ABL pcr on 09/09/20). Due for IT triple therapy this cycle, though this is not time sensitive and this can be delayed until clinical status improved.       Recommendations:   -Appreciate excellent care of MICU team  -Appreciate ICID assistance in antibiosis  -Continue holding blinatumomab   -Continue holding ponatinib  -Transfuse with leukoreduced blood products (irradiated preferred) for Hgb <7 and platelets <10  -To discuss case with primary oncologist, [name], to discuss timing of repeat LP w/ IT chemo    This patient has been staffed with Dr. Barbette Merino. These recommendations were discussed with the primary team.     Please contact the malignant hematology fellow at [phone] with any further questions.    Malignant Hematology will continue to follow.    Malva Cogan, MD  PGY-4, Hematology/Oncology Fellow  [facility]  Pager: [phone]    -------------------------------------------------------------    HPI:  Adam Keith is a 40 y.o. 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 Malignant hematology was consulted for B-ALL. Patient unable to provide history so history obtained from chart review.    He presented to ED on 7/30 with 2 days of SOB, right sided chest pain, productive cough and chills. Initially only required 2L but had worsening respiratory symptoms requiring Bipap and then emergent intubation in MICU on 7/31. Currently intubated and sedated.     He had similar admission in 07/2020 with SOB, increased sputum, fever, and CT chest finding concerning for fungal infection.  Additionally, bronchoscopy performed during that admission, positive for MRSA.  Followed by ID and treated with lenzolid and isavuconazole.  He had recent CT chest on 7/28 that was negative for new or persistent infection.    Review of Systems: Review of Systems - Unable to obtain as patient is sedated    Oncologic History:  Oncology History Overview Note   Referring/Local Oncologist: None    Diagnosis:Ph+ ALL    Genetics:    Karyotype/FISH:Abnormal Karyotype:  46,XY,t(9;22)(q34;q11.2)[1]/45,XY,der(7;9)(q10;q10)t(9;22)(q34;q11.2),der(22)t(9;22)[11]/46,sdl,+der(22)t(9;22)[5]/46,XY[3]     Abnormal FISH: A BCR/ABL1 interphase FISH assay shows an abnormal signal pattern in 97% of the 100 cells scored. 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  The findings support a diagnosis of leukemia and have implications for targeted therapy and for monitoring residual disease.      Molecular Genetics:  BCR-ABL1 p210 transcripts were detected at a level of 46.479 IS% ratio in bone marrow.  BCR-ABL1 p190 transcripts were detected at a level of 4 in 100,000 cells in bone marrow.    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)     01/24/2020 - 04/02/2020 Chemotherapy    IP/OP LEUKEMIA GRAAPH-2005 + RITUXIMAB < 60 YO  rituximab hypercvad (odd and even course)     02/24/2020 Remission    CR with PCR MRD+; marrow showing 70% blasts with <1% blasts, negative flow MRD, BCR-ABL p210 PCR 0.197%; CNS negative for blasts     03/09/2020 Progression    CSF - rare blast ID'ed - IT chemo given    03/13/20 - IT chemo, CSF negative     04/16/2020 Biopsy    BM Bx with 40-50% cellularity, <1% blasts, MRD flow cytometry negative, BCR-ABL p210 transcripts 0.036%     05/28/2020 - 05/28/2020 Chemotherapy    OP AML - CNS THERAPY (INTRATHECAL CYTARABINE, INTRATHECAL METHOTREXATE, OR INTRATHECAL TRIPLE)  Select one of the following: cytarabine IT 100 mg with hydrocortisone 50 mg, cytarabine IT 40 mg with methotrexate 15 mg with hydrocortisone 50 mg, OR methotrexate IT 12 mg with hydrocortisone 50 mg     06/19/2020 -  Chemotherapy    IP/OP LEUKEMIA BLINATUMOMAB 7-DAY INFUSION (MINIMAL RESIDUAL DISEASE; WT >= 22 KG) (HOME INFUSION)      Cycles 1*-4: Blinatumomab 28 mcg/day Days 1-28 of 6-week cycle.  *Given in the inpatient setting on Days 1-3 on Cycle 1 and Days 1-2 on Cycle 2, while other treatment days are given in the outpatient setting.    Cycle 1 MRD Dosing     07/18/2020 Adverse Reaction    Hospitalization: fevers. Pneumonia     07/23/2020 Biopsy    Diagnosis  Bone marrow, right iliac, aspiration and biopsy  -   Normocellular bone marrow (50%) with trilineage hematopoiesis and 1% blasts by manual aspirate differential  -   Flow cytometry MRD analysis reveals no definitive immunophenotypic evidence of residual B lymphoblastic leukemia   - BCR-ABL p210 0.006%         08/10/2020 -  Chemotherapy    Cycle 2 blinatumomab-ponatinib  Ponatinib 30mg     1 IT per cycle     09/21/2020 Adverse Reaction    Covid-19, symptomatic but not requiring hospitalization.     ALL (acute lymphoblastic leukemia) (CMS-HCC)   06/11/2020 -  Chemotherapy    IP/OP LEUKEMIA BLINATUMOMAB 7-DAY INFUSION (MINIMAL RESIDUAL DISEASE; WT >= 22 KG) (HOME INFUSION)  Cycles 1*-4: Blinatumomab 28 mcg/day Days 1-28 of 6-week cycle.  *Given in the inpatient setting on Days 1-3 on Cycle 1 and Days 1-2 on Cycle 2, while other treatment days are given in the outpatient setting.     09/21/2020 Initial Diagnosis    ALL (acute  lymphoblastic leukemia) (CMS-HCC)         Medical, Surgical, Social, and Family History reviewed and updated.    Social History     Social History Narrative   ??? Not on file       Allergies: is allergic to bupropion hcl, ceftaroline fosamil, dapsone, onion, vancomycin analogues, bismuth subsalicylate, and furosemide.    Medications:   Meds:  ??? acetaminophen  1,000 mg Enteral tube: gastric  Q8H   ??? aspirin  81 mg Enteral tube: gastric  Daily   ??? Cefepime  2 g Intravenous BID   ??? chlorhexidine  5 mL Topical BID   ??? DAPTOmycin  10 mg/kg Intravenous Q24H   ??? famotidine  20 mg Enteral tube: gastric  Daily   ??? gabapentin  300 mg Enteral tube: gastric  TID   ??? heparin (porcine) for subcutaneous use  5,000 Units Subcutaneous Q8H [facility]   ??? ipratropium-albuteroL  3 mL Nebulization Q6H (RT)   ??? isavuconazonium sulfate  372 mg Oral Daily   ??? linezolid  600 mg Intravenous Q12H [facility]   ??? melatonin  3 mg Enteral tube: gastric  QPM   ??? nortriptyline  75 mg Enteral tube: gastric  Nightly   ??? oxyCODONE  10 mg Enteral tube: gastric  Q6H   ??? polyethylene glycol  17 g Enteral tube: gastric  BID   ??? senna  2 tablet Enteral tube: gastric  Nightly   ??? sodium bicarbonate  1,300 mg Enteral tube: gastric  QID   ??? sulfamethoxazole-trimethoprim  1 tablet Enteral tube: gastric  Once per day on Mon Wed Fri   ??? valACYclovir  500 mg Enteral tube: gastric  Daily     Continuous Infusions:  ??? fentaNYL citrate (PF) 50 mcg/mL infusion 200 mcg/hr (11/02/20 1237)   ??? norepinephrine bitartrate-NS Stopped (11/02/20 1311)   ??? propofol 10 mg/mL infusion 10 mcg/kg/min (11/02/20 1237)     PRN Meds:.fentaNYL (PF) **OR** fentaNYL (PF)    Objective:   Vitals: Core Temp:  [37.2 ??C (98.96 ??F)-38.9 ??C (102.02 ??F)] 37.2 ??C (98.96 ??F)  Heart Rate:  [80-117] 87  SpO2 Pulse:  [79-117] 87  Resp:  [15-35] 26  BP: (102-119)/(61-62) 119/62  MAP (mmHg):  [71] 71  A BP-2: (74-140)/(48-85) 131/75  MAP:  [57 mmHg-103 mmHg] 91 mmHg  FiO2 (%):  [80 %-100 %] 80 %  SpO2:  [81 %-97 %] 91 %    Physical Exam:    GEN: Intubated and sedated   HEENT: sclerae anicteric, ETT in place  NECK: Supple, no JVD  HEME/LYMPH: No palpable cervical or supraclavicular lymphadenopathy  CVL: Right chest port with dressing c/d/i, no surrounding erythema, warmth, tenderness or drainage  CV: Tachycardia, regular rhythm  RESP: Mechanically ventilated breath sounds  GI: Soft, non-tender, non-distended, normoactive bowel sounds, no hepatosplenomegaly  EXT: warm, well-perfused, no lower extremity edema  MSK:  No bony pain or tenderness.   SKIN: No rashes or ecchymoses  NEURO: Sedated  PSYCH: Unable to assess      Test Results  Recent Labs     11/01/20  0004 11/01/20  0628 11/01/20  1322 11/01/20  1427 11/02/20  0510 11/02/20  0827   WBC 38.7* 25.7*  --   --  22.3*  --    NEUTROABS 35.3* 24.2*  --   --  20.0*  --    HGB 16.1 13.6   < > 13.3* 11.3* 6.9* PLT 185 198  --   --  192  --     < > =  values in this interval not displayed.       Imaging: Radiology studies were personally reviewed

## 2020-11-02 NOTE — Unmapped (Signed)
MICU Daily Progress Note     Date of Service: 11/02/2020    Problem List:   Principal Problem:    Pneumonia  Active Problems:    Tobacco use disorder    AKI (acute kidney injury) (CMS-HCC)    Recurrent major depressive disorder, in partial remission (CMS-HCC)    Hypogammaglobulinemia (CMS-HCC)    ALL (acute lymphoblastic leukemia) (CMS-HCC)    Respiratory failure with hypoxia (CMS-HCC)    Chronic pain  Resolved Problems:    * No resolved hospital problems. *      HPI: Adam [name] with PH+ B-ALL, COPD, hospitalization 07/2020 w/ MRSA bacteremia, Candida Krusei fungemia, active smoking, sp Covid infection 09/2020 followed by [facility] ID who presented to [facility] with worsening SOB x48 hours.    History provided by patient and wife. He was in his normal state of health until 10/29/20 when he started developing worsening SOB, productive cough and chills. Coughing spells were intermittently associated with he denies sick contacts, fever, diarrhea, dysuria, polyuria, aspiration or focalizing pain. Pt endorses taking all his medicines as prescribed including antifungal and ppx meds.     Of not pt hospitalized 08/2020 for induction therapy that was complicated by Septic Shock due to MRSA and Candida krusei fungemia, acute renal failure necessitating temporary iHD and AHRB requiring intubation and stay in MICU    Pt endorses being full code status and his decision maker is his wife as listed in epic.     Interval Events:  - intubated needing 100%  - desat w/ turns  - 1/2 blood cx w/ staph     Neurological   Chronic Pain, Anxiety Followed by Palliative care  -- holding home oxycontin 15mg  bid, dilaudid 2mg  po q4 prn mod sev pain and klonipin 0.5mg  bid prn anxiety  - cont home gabapentin     Depression  -- Continue home nortriptyline    #Sedation, acute metabolic encephalopathy  - neuro checks: q4h  - prop and fent gtts for vent compliance and comfort, goal RASS 2- to 3-  - scheduled oxy 10q6  - increase fentanyl as he is endorsing pain and wean off propofol        Pulmonary   AHRF 2/2 Pneumonia with History of Intubation, tobacco abuse   Arrived in ed needing bipap placement 70%. Duonebs given 2/2 history of copd and wheezing RLL in area of consolidation. Pt down to 65% with transition to Bipap 10/5.  - amidst scheduled nebs, airway clearance and narcotic for splinting, pt progressed to needing intubation d/t inc WOB and worsening hypoxia  - RSI per anesthesia w difficulty oxygenating afterwards, likely r/t de-recruitment and shunt physiology  - cont duoneb scheduled q6  - weaned to 80% + 12 this am.  No desats with awakening today, but does with turn.  Cont to wean for PaO2 > 55%.  DP stable on PEEP of 12     OSA  Pt with known sleep apnea on sleep study. Does not use cpap.    Cardiovascular   Tachycardia  Hypotension s/p intubationLD  - normotensive on arrival, tachy improved w fluid and analgesia  - EKG no evid acs and trop flat  - addtl bolus given on arrival to MICU and peri intubation  - levo started for MAP goals >65  - formal echo: normal   - cont home regimen asa    Renal   AKI on CKD  Metabolic acidosis  Baseline Cr around 1.8. AKI  to 2.4 most likely pre-renal in setting of NV from pneumonia and illness.  -- trend I/O w foley   -- 1 L LR prior to transfer  -- downtrend Cr since admit w volume repletion  - urine lytes pending     Infectious Disease/Autoimmune   Pneumonia w hx MRSA bacteremia, cand fungemia  48 hours of chills, sob and productive cough with cxr cf rll pneumonia and leukocytosis. Most likely bacterial infection as pt on ppx  With cresemba, bactrim and valtrex. Initial lactate 2.2 down to 1.2 with IVF resusitation. Given vanc in ED and will stop vancomycin given hx of hearing loss.    - febrile, leukocytosis   - transferred to [facility] on linezolid, mero, mica >> changed mero to dapto and cefepime given past tolerance to cefepime  - cont abd regimen per ICID  - scheduled apap for fevers  - f/u cx data >> one of two blodd cx positive for GPC, known prior mrsa  - cont contact precautions for MRSA  - holding BAL for now given tenuous resp status and reasonable trach aspirate   - Will require port removal: discussed with IR and they are able to come to MICU to remove if needed.  Discuss with ID   - repeat cx q 48 hrs  -Urine histo Ag pending  -Urine legionella Ag pending  -Serum crypto Ag pending     For MRSA bacteremia + suspected MRSA PNA  ?? Daptomycin  ?? Linezolid 600mg  q12h,  ?? cefepime 2g q8h     PPX:  -- Continue Home crescemba  -- Continue home bactrim, ss MWF  -- continue home valtrex 500mg  daily    Cultures:  Blood Culture, Routine (no units)   Date Value   11/01/2020 No Growth at 24 hours     Urine Culture, Comprehensive (no units)   Date Value   11/01/2020 NO GROWTH     WBC (10*9/L)   Date Value   11/02/2020 22.3 (H)         History of Dress  Hx of dress on ceftaroline was on prednisone taper in may 2022, but may still have been on 15 mg/day.  Verifying with wife.    -- monitor for dress while undergoing tx with abx  - derm consult     FEN/GI   #NAI  - started TF, adv to goal as tol  - nutrition consult pending  - pepcid for gi ppx >> stop when TF at goal  - senna and miralax for bowel regimen       Malnutrition Assessment: Not done yet.  Body mass index is 32.08 kg/m??.               Heme/Coag   Ph+ B-ALL  Followed by [facility] [name] on  C3D27 ponatinib-binatumomab running infusion upon transfer to MICU.   -- Consult Hem/Onc regarding chemotherapy/immunotherapy >> holding infusion for now, stopped 7/31  -- cont asa 81mg  daily while on ponatinib per onc recs    Hypogammaglobulinemia  Recieves IVIG q 6 weeks. Will check Ig levels as may benefit from IVIG in setting of infection.  --IgM, IgA, IgG    Endocrine   NAI  - accu checks q6    Integumentary   #NAI  - WOCN consulted for high risk skin assessment Yes.  - WOCN recs >> pending   - cont pressure mitigating precautions per skin policy    Prophylaxis/LDA/Restraints/Consults   Can CVC be removed? No: need for medications  requiring central access (e.g. pressors)   Can A-line be removed? N/A, no A-line present  Can Foley be removed? N/A, no Foley present  Mobility plan: Step 4 - Out of bed assessment (sit at bedside)    Feeding: Trickle feeds, advance as tolerated  Analgesia: Pain not adequately controlled, titrating medications  Sedation SAT/SBT: No FiO2 > .50, PEEP > 8 and Pressors, excluding vasopressin or dopamine </=5 mcg/kg/min  Thromboembolic ppx: SQ heparin  Head of bed >30 degrees: Yes  Ulcer ppx: Not indicated  Glucose within target range: Yes, in range    Does patient need/have an active type/screen? Yes    RASS at goal? No - adjusting sedation or order to reflect need  [place] Agitation Assessment Scale (RASS) : light sedation, briefly awakens to voice (eye (11/02/2020 12:00 PM)     Can antipsychotics be stopped? N/A, not on antipsychotics  CAM-ICU Result: CAM-ICU Positive (11/02/2020  8:00 AM)      Would hospice care be appropriate for this patient? No, patient improving or expected to improve    Patient Lines/Drains/Airways Status     Active Active Lines, Drains, & Airways     Name Placement date Placement time Site Days    ETT  7.5 11/01/20  1400  -- less than 1    Power Port--a-Cath Single Hub 03/10/20 Right Internal jugular 03/10/20  0935  Internal jugular  237    NG/OG Tube 16 Fr. Left nostril 11/01/20  1400  Left nostril  less than 1    Urethral Catheter 11/01/20  1600  --  less than 1    Peripheral IV 11/01/20 Right Forearm 11/01/20  0135  Forearm  1    Peripheral IV 11/01/20 Left Forearm 11/01/20  0330  Forearm  1    Peripheral IV 11/01/20 Anterior;Proximal;Right Forearm 11/01/20  1200  Forearm  1    Arterial Line 11/01/20 Left Radial 11/01/20  1200  Radial  1              Patient Lines/Drains/Airways Status     Active Wounds     Name Placement date Placement time Site Days    Wound 01/30/20 Pressure Injury Coccyx Mid Stage 3 01/30/20  --  Coccyx  277                Goals of Care     Code Status: Full Code    Designated Healthcare Decision Maker:  Mr. Winton current decisional capacity for healthcare decision-making is Full capacity. His designated Educational psychologist) is/are   HCDM (patient stated preference): Ledin, [name] - Spouse - [phone].      Subjective   C/o back pain           Allergies  Allergies   Allergen Reactions   ??? Bupropion Hcl Other (See Comments)     Per patient out of touch with reality, suicidal, homicidal   ??? Ceftaroline Fosamil Rash and Other (See Comments)     Rash X 2 02/2020, suspected DRESS 06/2020   ??? 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 Onion Anaphylaxis   ??? Vancomycin Analogues      Hearing loss with Lasix  Other reaction(s): Other (See Comments)  Hearing loss  lasix   ??? Bismuth Subsalicylate Nausea And Vomiting   ??? Furosemide      With Vancomycin caused hearing loss  Other reaction(s): Other (See Comments)  With Vancomycin caused hearing loss  Meds  No current facility-administered medications on file prior to encounter.     Current Outpatient Medications on File Prior to Encounter   Medication Sig   ??? acetaminophen (TYLENOL) 325 MG tablet Take 650 mg by mouth every six (6) hours as needed for pain.    ??? amLODIPine (NORVASC) 10 MG tablet Take 1 tablet (10 mg total) by mouth daily.   ??? aspirin 81 MG chewable tablet Chew 1 tablet (81 mg total) daily.   ??? carvediloL (COREG) 12.5 MG tablet TAKE 1 TABLET BY MOUTH TWO TIMES A DAY.   ??? clonazePAM (KLONOPIN) 0.5 MG tablet Take 1 tablet (0.5 mg total) by mouth two (2) times a day as needed for anxiety or sleep.   ??? gabapentin (NEURONTIN) 300 MG capsule Take 1 capsule (300 mg total) by mouth Three (3) times a day.   ??? hydrOXYzine (ATARAX) 25 MG tablet Take 1 tablet (25 mg total) by mouth every six (6) hours as needed.   ??? isavuconazonium sulfate (CRESEMBA) 186 mg cap capsule Take 2 capsules (372 mg total) by mouth daily.   ??? oxyCODONE (OXYCONTIN) 15 mg 12 hr crush resistant ER/CR tablet Take 1 tablet (15 mg total) by mouth two (2) times a day for 28 days.   ??? OXYCONTIN 15 mg 12 hr crush resistant ER/CR tablet TAKE 1 TABLET (15 MG TOTAL) BY MOUTH TWO (2) TIMES A DAY FOR 28 DAYS.   ??? PONATinib (ICLUSIG) 30 mg tablet Take 1 tablet (30 mg total) by mouth daily. Swallow tablets whole. Do not crush, break, cut or chew tablets.   ??? prochlorperazine (COMPAZINE) 10 MG tablet Take 1 tablet (10 mg total) by mouth every six (6) hours as needed for nausea (if no relief from zofran (ondansetron)).   ??? sulfamethoxazole-trimethoprim (BACTRIM DS) 800-160 mg per tablet Take 1 tablet (160 mg of trimethoprim total) by mouth 2 times a day on Saturday, Sunday. For prophylaxis while on chemo.   ??? valACYclovir (VALTREX) 500 MG tablet Take 1 tablet (500 mg total) by mouth daily.   ??? nortriptyline (PAMELOR) 75 MG capsule Take 1 capsule (75 mg total) by mouth nightly.       Past Medical History  Past Medical History:   Diagnosis Date   ??? 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 BONE MARROW BIOPSY & ASPIRATION  01/21/2020        ??? IR INSERT PORT AGE GREATER THAN 5 YRS  03/10/2020    IR INSERT PORT AGE GREATER THAN 5 YRS 03/10/2020 [name], MD IMG VIR H&V [facility]   ??? PR BRONCHOSCOPY,DIAGNOSTIC W LAVAGE Bilateral 07/20/2020    Procedure: BRONCHOSCOPY, RIGID OR FLEXIBLE, INCLUDE FLUOROSCOPIC GUIDANCE WHEN PERFORMED; W/BRONCHIAL ALVEOLAR LAVAGE WITH MODERATE SEDATION;  Surgeon: Dellis Filbert, MD;  Location: BRONCH PROCEDURE LAB [facility];  Service: Pulmonary         Objective     Vitals - past 24 hours  Heart Rate:  [80-139] 84  SpO2 Pulse:  [79-138] 84  Resp:  [15-40] 26  BP: (102-147)/(61-101) 119/62  FiO2 (%):  [80 %-100 %] 80 %  SpO2:  [81 %-99 %] 92 % Intake/Output  I/O last 3 completed shifts:  In: 4192.2 [P.O.:240; I.V.:456.8; NG/GT:325; IV Piggyback:3170.4]  Out: 1000 [Urine:1000]     Physical Exam:    General: intubated, eyes opened to voice   HEENT:  MMM,   CV: Soft S1,S2, no murmurs, no BLE edema  Pulm: Diminished throughout and bases w R>L, apices few scattered rhonchi, no wheezing  GI:  NTND, BS+ no rebound or guarding x near Rt CPA and chest wall  MSK: normal muslce tone  Skin: no rashes/esions, clammy   Neuro: FC, mAE     Continuous Infusions:   ??? fentaNYL citrate (PF) 50 mcg/mL infusion 150 mcg/hr (11/02/20 0901)   ??? norepinephrine bitartrate-NS 2 mcg/min (11/02/20 0915)   ??? propofol 10 mg/mL infusion 20 mcg/kg/min (11/02/20 1029)       ??? fentaNYL citrate (PF) 50 mcg/mL infusion 150 mcg/hr (11/02/20 0901)   ??? norepinephrine bitartrate-NS 2 mcg/min (11/02/20 0915)   ??? propofol 10 mg/mL infusion 20 mcg/kg/min (11/02/20 1029)       Scheduled Medications:   ??? acetaminophen  1,000 mg Enteral tube: gastric  Q8H   ??? aspirin  81 mg Enteral tube: gastric  Daily   ??? Cefepime  2 g Intravenous BID   ??? chlorhexidine  5 mL Topical BID   ??? DAPTOmycin  10 mg/kg Intravenous Q24H   ??? famotidine  20 mg Enteral tube: gastric  Daily   ??? gabapentin  300 mg Enteral tube: gastric  TID   ??? heparin (porcine) for subcutaneous use  5,000 Units Subcutaneous Q8H [facility]   ??? ipratropium-albuteroL  3 mL Nebulization Q6H (RT)   ??? isavuconazonium sulfate  372 mg Oral Daily   ??? linezolid  600 mg Intravenous Q12H [facility]   ??? melatonin  3 mg Enteral tube: gastric  QPM   ??? nortriptyline  75 mg Enteral tube: gastric  Nightly   ??? oxyCODONE  10 mg Enteral tube: gastric  Q6H   ??? polyethylene glycol  17 g Enteral tube: gastric  BID   ??? senna  2 tablet Enteral tube: gastric  Nightly   ??? sodium bicarbonate  1,300 mg Enteral tube: gastric  QID   ??? sulfamethoxazole-trimethoprim  1 tablet Enteral tube: gastric  Once per day on Mon Wed Fri   ??? valACYclovir  500 mg Enteral tube: gastric  Daily       PRN medications:  fentaNYL (PF) **OR** fentaNYL (PF)    Data/Imaging Review: Reviewed in Epic and personally interpreted on 11/02/2020. See EMR for detailed results.      Critical Care Attestation     This patient is critically ill or injured with the impairment of vital organ systems such that there is a high probability of imminent or life threatening deterioration in the patient's condition. This patient must remain in the ICU for ongoing evaluation of the comprehensive management plan outlined in this note. I directly provided critical care services as documented in this note and the critical care time spent (50  min) is exclusive of separately billable procedures.  In addition to time spent for critical care management, I also provided advance care planning services for 0  minutes (see ACP note for details)???. Total billable critical care time 50 minutes.    Kloe Oates Paulino Door, PA

## 2020-11-03 LAB — BLOOD GAS, ARTERIAL
BASE EXCESS ARTERIAL: -2 (ref -2.0–2.0)
FIO2 ARTERIAL: 60
HCO3 ARTERIAL: 23 mmol/L (ref 22–27)
O2 SATURATION ARTERIAL: 91.4 % — ABNORMAL LOW (ref 94.0–100.0)
PCO2 ARTERIAL: 39.9 mmHg (ref 35.0–45.0)
PH ARTERIAL: 7.37 (ref 7.35–7.45)
PO2 ARTERIAL: 62.8 mmHg — ABNORMAL LOW (ref 80.0–110.0)

## 2020-11-03 LAB — BLOOD GAS CRITICAL CARE PANEL, ARTERIAL
BASE EXCESS ARTERIAL: -3.6 — ABNORMAL LOW (ref -2.0–2.0)
BASE EXCESS ARTERIAL: -1.7 (ref -2.0–2.0)
CALCIUM IONIZED ARTERIAL (MG/DL): 4.39 mg/dL — ABNORMAL LOW (ref 4.40–5.40)
CALCIUM IONIZED ARTERIAL (MG/DL): 4.55 mg/dL (ref 4.40–5.40)
GLUCOSE WHOLE BLOOD: 143 mg/dL (ref 70–179)
GLUCOSE WHOLE BLOOD: 119 mg/dL (ref 70–179)
HCO3 ARTERIAL: 21 mmol/L — ABNORMAL LOW (ref 22–27)
HCO3 ARTERIAL: 23 mmol/L (ref 22–27)
HEMOGLOBIN BLOOD GAS: 9.8 g/dL — ABNORMAL LOW
HEMOGLOBIN BLOOD GAS: 10.4 g/dL — ABNORMAL LOW
LACTATE BLOOD ARTERIAL: 0.9 mmol/L (ref <1.3)
LACTATE BLOOD ARTERIAL: 0.7 mmol/L (ref <1.3)
O2 SATURATION ARTERIAL: 97.6 % (ref 94.0–100.0)
O2 SATURATION ARTERIAL: 96.6 % (ref 94.0–100.0)
PCO2 ARTERIAL: 35.9 mmHg (ref 35.0–45.0)
PCO2 ARTERIAL: 39.5 mmHg (ref 35.0–45.0)
PH ARTERIAL: 7.38 (ref 7.35–7.45)
PH ARTERIAL: 7.38 (ref 7.35–7.45)
PO2 ARTERIAL: 94.7 mmHg (ref 80.0–110.0)
PO2 ARTERIAL: 89.5 mmHg (ref 80.0–110.0)
POTASSIUM WHOLE BLOOD: 3.6 mmol/L (ref 3.4–4.6)
POTASSIUM WHOLE BLOOD: 3.7 mmol/L (ref 3.4–4.6)
SODIUM WHOLE BLOOD: 133 mmol/L — ABNORMAL LOW (ref 135–145)
SODIUM WHOLE BLOOD: 136 mmol/L (ref 135–145)

## 2020-11-03 LAB — COMPREHENSIVE METABOLIC PANEL
ALBUMIN: 2 g/dL — ABNORMAL LOW (ref 3.4–5.0)
ALKALINE PHOSPHATASE: 100 U/L (ref 46–116)
ALT (SGPT): 22 U/L (ref 10–49)
ANION GAP: 6 mmol/L (ref 5–14)
AST (SGOT): 16 U/L (ref <=34)
BILIRUBIN TOTAL: 0.2 mg/dL — ABNORMAL LOW (ref 0.3–1.2)
BLOOD UREA NITROGEN: 45 mg/dL — ABNORMAL HIGH (ref 9–23)
BUN / CREAT RATIO: 18
CALCIUM: 8.1 mg/dL — ABNORMAL LOW (ref 8.7–10.4)
CHLORIDE: 105 mmol/L (ref 98–107)
CO2: 22 mmol/L (ref 20.0–31.0)
CREATININE: 2.55 mg/dL — ABNORMAL HIGH
EGFR CKD-EPI (2021) MALE: 32 mL/min/{1.73_m2} — ABNORMAL LOW (ref >=60)
GLUCOSE RANDOM: 140 mg/dL (ref 70–179)
POTASSIUM: 4.3 mmol/L (ref 3.4–4.8)
PROTEIN TOTAL: 4.9 g/dL — ABNORMAL LOW (ref 5.7–8.2)
SODIUM: 133 mmol/L — ABNORMAL LOW (ref 135–145)

## 2020-11-03 LAB — CBC W/ AUTO DIFF
BASOPHILS ABSOLUTE COUNT: 0.1 10*9/L (ref 0.0–0.1)
BASOPHILS RELATIVE PERCENT: 0.6 %
EOSINOPHILS ABSOLUTE COUNT: 0.1 10*9/L (ref 0.0–0.5)
EOSINOPHILS RELATIVE PERCENT: 0.8 %
HEMATOCRIT: 28.8 % — ABNORMAL LOW (ref 39.0–48.0)
HEMOGLOBIN: 9.9 g/dL — ABNORMAL LOW (ref 12.9–16.5)
LYMPHOCYTES ABSOLUTE COUNT: 0.2 10*9/L — ABNORMAL LOW (ref 1.1–3.6)
LYMPHOCYTES RELATIVE PERCENT: 1.6 %
MEAN CORPUSCULAR HEMOGLOBIN CONC: 34.5 g/dL (ref 32.0–36.0)
MEAN CORPUSCULAR HEMOGLOBIN: 32.5 pg — ABNORMAL HIGH (ref 25.9–32.4)
MEAN CORPUSCULAR VOLUME: 94.3 fL (ref 77.6–95.7)
MEAN PLATELET VOLUME: 8.8 fL (ref 6.8–10.7)
MONOCYTES ABSOLUTE COUNT: 0.3 10*9/L (ref 0.3–0.8)
MONOCYTES RELATIVE PERCENT: 2.2 %
NEUTROPHILS ABSOLUTE COUNT: 12.5 10*9/L — ABNORMAL HIGH (ref 1.8–7.8)
NEUTROPHILS RELATIVE PERCENT: 94.8 %
PLATELET COUNT: 173 10*9/L (ref 150–450)
RED BLOOD CELL COUNT: 3.06 10*12/L — ABNORMAL LOW (ref 4.26–5.60)
RED CELL DISTRIBUTION WIDTH: 16.4 % — ABNORMAL HIGH (ref 12.2–15.2)
WBC ADJUSTED: 13.1 10*9/L — ABNORMAL HIGH (ref 3.6–11.2)

## 2020-11-03 LAB — PHOSPHORUS: PHOSPHORUS: 4.2 mg/dL (ref 2.4–5.1)

## 2020-11-03 LAB — CMV DNA, QUANTITATIVE, PCR: CMV VIRAL LD: NOT DETECTED

## 2020-11-03 LAB — MAGNESIUM: MAGNESIUM: 2.5 mg/dL (ref 1.6–2.6)

## 2020-11-03 LAB — TRIGLYCERIDES: TRIGLYCERIDES: 934 mg/dL — ABNORMAL HIGH (ref 0–150)

## 2020-11-03 LAB — HHV-6 PCR, BLOOD: HHV6 PCR, BLOOD: NEGATIVE

## 2020-11-03 MED ADMIN — DAPTOmycin (CUBICIN) 1,150 mg in sodium chloride (NS) 0.9 % 50 mL IVPB: 10 mg/kg | INTRAVENOUS | @ 22:00:00 | Stop: 2020-11-15

## 2020-11-03 MED ADMIN — valACYclovir (VALTREX) tablet 500 mg: 500 mg | GASTROENTERAL | @ 13:00:00

## 2020-11-03 MED ADMIN — nortriptyline (PAMELOR) capsule 75 mg: 75 mg | GASTROENTERAL

## 2020-11-03 MED ADMIN — aspirin chewable tablet 81 mg: 81 mg | GASTROENTERAL | @ 13:00:00

## 2020-11-03 MED ADMIN — sodium bicarbonate tablet 1,300 mg: 1300 mg | GASTROENTERAL | @ 09:00:00 | Stop: 2020-11-03

## 2020-11-03 MED ADMIN — gabapentin (NEURONTIN) capsule 300 mg: 300 mg | GASTROENTERAL

## 2020-11-03 MED ADMIN — ipratropium-albuteroL (DUO-NEB) 0.5-2.5 mg/3 mL nebulizer solution 3 mL: 3 mL | RESPIRATORY_TRACT | @ 20:00:00

## 2020-11-03 MED ADMIN — polyethylene glycol (MIRALAX) packet 17 g: 17 g | GASTROENTERAL | @ 13:00:00

## 2020-11-03 MED ADMIN — acetaminophen (TYLENOL) tablet 1,000 mg: 1000 mg | GASTROENTERAL | @ 04:00:00 | Stop: 2020-12-01

## 2020-11-03 MED ADMIN — oxyCODONE (ROXICODONE) immediate release tablet 20 mg: 20 mg | GASTROENTERAL | @ 21:00:00 | Stop: 2020-11-17

## 2020-11-03 MED ADMIN — melatonin tablet 3 mg: 3 mg | GASTROENTERAL | @ 21:00:00

## 2020-11-03 MED ADMIN — isavuconazonium sulfate (CRESEMBA) capsule 372 mg: 372 mg | ORAL | @ 15:00:00

## 2020-11-03 MED ADMIN — fentaNYL (PF) (SUBLIMAZE) injection 50 mcg: 50 ug | INTRAVENOUS | Stop: 2020-11-15

## 2020-11-03 MED ADMIN — fentaNYL PF (SUBLIMAZE) (50 mcg/mL) infusion (bag): 0-200 ug/h | INTRAVENOUS | @ 14:00:00 | Stop: 2020-11-03

## 2020-11-03 MED ADMIN — linezolid in dextrose 5% (ZYVOX) 600 mg/300 mL IVPB 600 mg: 600 mg | INTRAVENOUS | Stop: 2020-11-11

## 2020-11-03 MED ADMIN — sodium bicarbonate tablet 1,300 mg: 1300 mg | GASTROENTERAL

## 2020-11-03 MED ADMIN — heparin (porcine) 5,000 unit/mL injection 5,000 Units: 5000 [IU] | SUBCUTANEOUS | @ 01:00:00

## 2020-11-03 MED ADMIN — famotidine (PEPCID) tablet 20 mg: 20 mg | GASTROENTERAL | @ 14:00:00 | Stop: 2020-11-03

## 2020-11-03 MED ADMIN — gabapentin (NEURONTIN) capsule 300 mg: 300 mg | GASTROENTERAL | @ 13:00:00

## 2020-11-03 MED ADMIN — linezolid in dextrose 5% (ZYVOX) 600 mg/300 mL IVPB 600 mg: 600 mg | INTRAVENOUS | @ 15:00:00 | Stop: 2020-11-11

## 2020-11-03 MED ADMIN — sodium bicarbonate tablet 650 mg: 650 mg | GASTROENTERAL | @ 21:00:00

## 2020-11-03 MED ADMIN — acetaminophen (TYLENOL) tablet 1,000 mg: 1000 mg | GASTROENTERAL | @ 21:00:00 | Stop: 2020-12-01

## 2020-11-03 MED ADMIN — oxyCODONE (ROXICODONE) immediate release tablet 20 mg: 20 mg | GASTROENTERAL | @ 17:00:00 | Stop: 2020-11-17

## 2020-11-03 MED ADMIN — lidocaine (LIDODERM) 5 % patch 1 patch: 1 | TRANSDERMAL | @ 17:00:00

## 2020-11-03 MED ADMIN — oxyCODONE (ROXICODONE) immediate release tablet 10 mg: 10 mg | GASTROENTERAL | @ 04:00:00 | Stop: 2020-11-15

## 2020-11-03 MED ADMIN — gabapentin (NEURONTIN) capsule 300 mg: 300 mg | GASTROENTERAL | @ 18:00:00

## 2020-11-03 MED ADMIN — heparin (porcine) 5,000 unit/mL injection 5,000 Units: 5000 [IU] | SUBCUTANEOUS | @ 18:00:00

## 2020-11-03 MED ADMIN — predniSONE (DELTASONE) tablet 20 mg: 20 mg | GASTROENTERAL | @ 13:00:00

## 2020-11-03 MED ADMIN — chlorhexidine (PERIDEX) 0.12 % solution 5 mL: 5 mL | TOPICAL | @ 12:00:00 | Stop: 2020-11-03

## 2020-11-03 MED ADMIN — ipratropium-albuteroL (DUO-NEB) 0.5-2.5 mg/3 mL nebulizer solution 3 mL: 3 mL | RESPIRATORY_TRACT | @ 13:00:00

## 2020-11-03 MED ADMIN — acetaminophen (TYLENOL) tablet 1,000 mg: 1000 mg | GASTROENTERAL | @ 13:00:00 | Stop: 2020-12-01

## 2020-11-03 MED ADMIN — senna (SENOKOT) tablet 2 tablet: 2 | GASTROENTERAL

## 2020-11-03 MED ADMIN — oxyCODONE (ROXICODONE) immediate release tablet 10 mg: 10 mg | GASTROENTERAL | @ 09:00:00 | Stop: 2020-11-03

## 2020-11-03 MED ADMIN — ipratropium-albuteroL (DUO-NEB) 0.5-2.5 mg/3 mL nebulizer solution 3 mL: 3 mL | RESPIRATORY_TRACT | @ 09:00:00

## 2020-11-03 MED ADMIN — polyethylene glycol (MIRALAX) packet 17 g: 17 g | GASTROENTERAL

## 2020-11-03 MED ADMIN — ipratropium-albuteroL (DUO-NEB) 0.5-2.5 mg/3 mL nebulizer solution 3 mL: 3 mL | RESPIRATORY_TRACT | @ 01:00:00

## 2020-11-03 MED ADMIN — fentaNYL PF (SUBLIMAZE) (50 mcg/mL) infusion (bag): 0-200 ug/h | INTRAVENOUS | @ 01:00:00 | Stop: 2020-11-15

## 2020-11-03 MED ADMIN — heparin (porcine) 5,000 unit/mL injection 5,000 Units: 5000 [IU] | SUBCUTANEOUS | @ 09:00:00

## 2020-11-03 MED ADMIN — sodium bicarbonate tablet 650 mg: 650 mg | GASTROENTERAL | @ 17:00:00

## 2020-11-03 NOTE — Unmapped (Signed)
This shift, patient remains on ventilator- FiO2 weaned as tolerated. Patient alert/cooperative/follows commands, sedation continued with fentanyl drip, propofol discontinued. Scheduled oxycodone continued for chronic pain history. Bilateral wrist restraints continued for patient safety, but frequently released to allow patient to stretch.     Problem: Adult Inpatient Plan of Care  Goal: Plan of Care Review  Outcome: Ongoing - Unchanged  Goal: Patient-Specific Goal (Individualized)  Outcome: Ongoing - Unchanged  Goal: Absence of Hospital-Acquired Illness or Injury  Outcome: Ongoing - Unchanged  Intervention: Identify and Manage Fall Risk  Recent Flowsheet Documentation  Taken 11/02/2020 0800 by Bertrum Sol, RN BSN  Safety Interventions:   aspiration precautions   bleeding precautions   enteral feeding safety   fall reduction program maintained   isolation precautions   lighting adjusted for tasks/safety   low bed   neutropenic precautions  Intervention: Prevent Skin Injury  Recent Flowsheet Documentation  Taken 11/02/2020 1600 by Bertrum Sol, RN BSN  Skin Protection:   adhesive use limited   cleansing with dimethicone incontinence wipes   incontinence pads utilized   pulse oximeter probe site changed   transparent dressing maintained   zinc oxide barrier cream   tubing/devices free from skin contact  Taken 11/02/2020 1200 by Bertrum Sol, RN BSN  Skin Protection:   cleansing with dimethicone incontinence wipes   adhesive use limited   incontinence pads utilized   zinc oxide barrier cream   tubing/devices free from skin contact   pulse oximeter probe site changed   silicone foam dressing in place  Taken 11/02/2020 0800 by Bertrum Sol, RN BSN  Skin Protection:   adhesive use limited   cleansing with dimethicone incontinence wipes   incontinence pads utilized   pulse oximeter probe site changed   silicone foam dressing in place   tubing/devices free from skin contact   zinc oxide barrier cream transparent dressing maintained  Intervention: Prevent and Manage VTE (Venous Thromboembolism) Risk  Recent Flowsheet Documentation  Taken 11/02/2020 1800 by Bertrum Sol, RN BSN  Activity Management: bedrest  Taken 11/02/2020 1600 by Bertrum Sol, RN BSN  Activity Management: bedrest  VTE Prevention/Management: anticoagulant therapy  Taken 11/02/2020 1400 by Bertrum Sol, RN BSN  Activity Management: bedrest  Taken 11/02/2020 1200 by Bertrum Sol, RN BSN  Activity Management: bedrest  VTE Prevention/Management: anticoagulant therapy  Taken 11/02/2020 1000 by Bertrum Sol, RN BSN  Activity Management: bedrest  Taken 11/02/2020 0800 by Bertrum Sol, RN BSN  Activity Management: bedrest  Goal: Optimal Comfort and Wellbeing  Outcome: Ongoing - Unchanged  Goal: Readiness for Transition of Care  Outcome: Ongoing - Unchanged  Goal: Rounds/Family Conference  Outcome: Ongoing - Unchanged     Problem: Fluid Imbalance (Pneumonia)  Goal: Fluid Balance  Outcome: Ongoing - Unchanged     Problem: Infection  Goal: Absence of Infection Signs and Symptoms  Outcome: Ongoing - Unchanged  Intervention: Prevent or Manage Infection  Recent Flowsheet Documentation  Taken 11/02/2020 0800 by Bertrum Sol, RN BSN  Isolation Precautions:   protective precautions maintained   contact precautions maintained     Problem: Respiratory Compromise (Pneumonia)  Goal: Effective Oxygenation and Ventilation  Outcome: Ongoing - Unchanged  Intervention: Optimize Oxygenation and Ventilation  Recent Flowsheet Documentation  Taken 11/02/2020 1800 by Bertrum Sol, RN BSN  Head of Bed [facility]) Positioning: HOB at 30-45 degrees  Taken 11/02/2020 1600 by Bertrum Sol, RN BSN  Head of Bed [facility])  Positioning: HOB at 30-45 degrees  Taken 11/02/2020 1400 by Bertrum Sol, RN BSN  Head of Bed [facility]) Positioning: HOB at 30-45 degrees  Taken 11/02/2020 1200 by Bertrum Sol, RN BSN  Head of Bed [facility]) Positioning: HOB at 30-45 degrees  Taken 11/02/2020 1000 by Bertrum Sol, RN BSN  Head of Bed [facility]) Positioning: HOB at 30-45 degrees  Taken 11/02/2020 0800 by Bertrum Sol, RN BSN  Head of Bed [facility]) Positioning: HOB at 30-45 degrees

## 2020-11-03 NOTE — Unmapped (Signed)
Care Management  Initial Transition Planning Assessment    Per chart review, patient is a 40 y.o. male with PH+ B-ALL, COPD, hospitalization 07/2020 w/ MRSA bacteremia, Candida Krusei fungemia, active smoking, sp Covid infection 09/2020 followed by [facility] ID who presented to [facility] with worsening SOB x48 hours.    Patient lives with Adam Keith (wife) in [place] [facility]) in a  single level home with four steps to enter. At baseline patient ambulates without assistance and is independent with ADL's. He does not use home health services and has no DME.  [name] will transport home and assist with basic care. Patient is vaccinated and received a booster.              General  Care Manager assessed the patient by : Telephone conversation with family, Medical record review, Discussion with Clinical Care team  Orientation Level: Oriented X4  Functional level prior to admission: Independent  Reason for referral: Discharge Planning    Contact/Decision Maker  Extended Emergency Contact Information  Primary Emergency Contact: Basista,[name]  Mobile Phone: [phone]  Relation: Spouse  Preferred language: ENGLISH  Interpreter needed? No    Legal Next of Kin / Guardian / POA / Advance Directives     HCDM (patient stated preference): [place], [place] - [phone]    Advance Directive (Medical Treatment)  Does patient have an advance directive covering medical treatment?: Patient does not have advance directive covering medical treatment.  Reason patient does not have an advance directive covering medical treatment:: Patient does not wish to complete one at this time.    Health Care Decision Maker [HCDM] (Medical & Mental Health Treatment)  Healthcare Decision Maker: HCDM documented in the HCDM/Contact Info section. Duffy Rhody (Wife))  Information offered on HCDM, Medical & Mental Health advance directives:: Patient declined information.         Readmission Information    Have you been hospitalized in the last 30 days?: No                                Did the following happen with your discharge?                                                     Patient Information  Lives with: Spouse/significant other    Type of Residence: Private residence (Single level home with 4 steps to enter)        Location/Detail: [address]  [place], [place] [address]    Support Systems/Concerns: Spouse    Responsibilities/Dependents at home?: No    Home Care services in place prior to admission?: No                  Equipment Currently Used at Home: none       Currently receiving outpatient dialysis?: No       Financial Information       Need for financial assistance?: No       Social Determinants of Health  Social Determinants of Health     Tobacco Use: Medium Risk   ??? Smoking Tobacco Use: Former Smoker   ??? Smokeless Tobacco Use: Former Neurosurgeon   Alcohol Use: Not on Programmer, applications Strain: Low Risk    ??? Difficulty of  Paying Living Expenses: Not very hard   Food Insecurity: No Food Insecurity   ??? Worried About Running Out of Food in the Last Year: Never true   ??? Ran Out of Food in the Last Year: Never true   Transportation Needs: No Transportation Needs   ??? Lack of Transportation (Medical): No   ??? Lack of Transportation (Non-Medical): No   Physical Activity: Not on file   Stress: Not on file   Social Connections: Not on file   Intimate Partner Violence: Not on file   Depression: Not on file   Housing/Utilities: Low Risk    ??? Within the past 12 months, have you ever stayed: outside, in a car, in a tent, in an overnight shelter, or temporarily in someone else's home (i.e. couch-surfing)?: No   ??? Are you worried about losing your housing?: No   ??? Within the past 12 months, have you been unable to get utilities (heat, electricity) when it was really needed?: No   Substance Use: Not on file   Health Literacy: Not on file       Complex Discharge Information    Is patient identified as a difficult/complex discharge?: No Interventions:       Discharge Needs Assessment  Concerns to be Addressed: discharge planning    Clinical Risk Factors: New Diagnosis, Principal Diagnosis: Cancer, Stroke, COPD, Heart Failure, AMI, Pneumonia, Joint Replacment    Barriers to taking medications: No    Prior overnight hospital stay or ED visit in last 90 days: No         Patient's Choice of Community Agency(s): Family is agreeable to home health services if recommended, no preference of agency but would like to look at a list of agencies if [facility] is recommended.    Anticipated Changes Related to Illness: other (see comments) (Likely will need time to recover before resuming usual activities.)    Equipment Needed After Discharge: other (see comments) (CM will follow for DME needs.)    Discharge Facility/Level of Care Needs: other (see comments) (CM will follow for DC needs.)    Readmission  Risk of Unplanned Readmission Score: UNPLANNED READMISSION SCORE: 51.92%  Predictive Model Details          52% (High)  Factor Value    Calculated 11/03/2020 12:04 15% Number of active Rx orders 39    Keller Risk of Unplanned Readmission Model 13% Number of hospitalizations in last year 5     9% Number of ED visits in last six months 3     6% Diagnosis of cancer present     5% Active antipsychotic Rx order present     5% ECG/EKG order present in last 6 months     5% Latest calcium low (8.1 mg/dL)     4% Latest BUN high (45 mg/dL)     4% Encounter of ten days or longer in last year present     4% Diagnosis of electrolyte disorder present     4% Restraint order present in last 6 months     4% Imaging order present in last 6 months     3% Latest hemoglobin low (9.9 g/dL)     3% Phosphorous result present     3% Charlson Comorbidity Index 4     2% Active anticoagulant Rx order present     2% Active corticosteroid Rx order present     2% Latest creatinine high (2.55 mg/dL)     2% Diagnosis of renal failure present  2% Age 52     1% Current length of stay 2.312 days     1% Future appointment scheduled     1% Active ulcer medication Rx order present      Readmitted Within the Last 30 Days? (No if blank)   Patient at risk for readmission?: Yes (Ongoing chemotherapy treatments)    Discharge Plan  Screen findings are: Discharge planning needs identified or anticipated (Comment). (CM will follow for DC needs.)    Expected Discharge Date:     Expected Transfer from Critical Care: 11/03/20       Patient and/or family were provided with choice of facilities / services that are available and appropriate to meet post hospital care needs?: Other (Comment) (Choice of facilities/services will be provided if deemed medically necessary.)       Initial Assessment complete?: Yes

## 2020-11-03 NOTE — Unmapped (Signed)
WOCN Consult Services                                                 Wound Evaluation: Pressure Injury    Reason for Consult:   - Initial  - Pressure Injury    Problem List:   Principal Problem:    Pneumonia  Active Problems:    Tobacco use disorder    AKI (acute kidney injury) (CMS-HCC)    MRSA bacteremia    Recurrent major depressive disorder, in partial remission (CMS-HCC)    Immunocompromised (CMS-HCC)    Hypogammaglobulinemia (CMS-HCC)    ALL (acute lymphoblastic leukemia) (CMS-HCC)    Respiratory failure with hypoxia (CMS-HCC)    Chronic pain    Acute kidney injury superimposed on CKD (CMS-HCC)    COPD (chronic obstructive pulmonary disease) (CMS-HCC)    Assessment:   Per EMR, pt with ALL, AKI, RF with hypoxia, MRSA, Covid + in late June 2022.      WOCN initial consult for Stage 3 Pressure Injury.  Upon arrival, pt intubated in ICU; RN in with patient.  Introduced self/role to pt, who gives permission to assess.  Turned pt to side and noted no skin breakdown at present to  Sacral, coccygeal, buttocks area, or anywhere else on backside.  Noted pt has a midline coccyx linear scar from previous full thickness wound -- which patient confirms.  Pt is able to turn self in bed, and RN will apply mepilex every other day preventatively.  No picture taken today of coccyx area.  Heels, hips, elbows all intact at this time.  Focused assessment today.    Continence Status:   Continent    Moisture Associated Skin Damage:   - n/a     Lab Results   Component Value Date    WBC 13.1 (H) 11/03/2020    HGB 9.9 (L) 11/03/2020    HCT 28.8 (L) 11/03/2020    ESR 30 (H) 07/18/2020    CRP 15.0 (H) 10/20/2020    A1C 5.0 05/13/2020    GLU 140 11/03/2020    POCGLU 123 11/02/2020    ALBUMIN 2.0 (L) 11/03/2020    PROT 4.9 (L) 11/03/2020     Risk Factors:   - Extended Hospitalization  - Friction/shear  - Immunosuppression  - Multiple co-morbidities    Braden Scale Score: 14 Support Surface:   - Low Air Loss - ICU    Type Debridement Completed By WOCN:  N/A    Teaching:  - Offloading  - Routine skin care  - Turning and repositioning  - mepilex dressings for prevention    WOCN Recommendations:   - See nursing orders for wound care instructions.  - Contact WOCN with questions, concerns, or wound deterioration.  - Mepilex sacral bordered foam dressing prophylactically.    Topical Therapy/Interventions:   - Silicone bordered foam    Recommended Consults:   - Not Applicable    WOCN Follow Up:  - We will sign off at this time  May reconsult as needed in future    Plan of Care Discussed With:   - Patient  - RN Mardella Layman    Supplies Ordered: No    Workup Time:   30 minutes     Marzetta Merino, [place]  [phone]

## 2020-11-03 NOTE — Unmapped (Signed)
MICU Evening Summary     Date of Service: 11/02/2020    Interval History: Adam 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 He presented 7/31 to ED w two days of SOB and tachycardia c/f sepsis w new RLL consolidation (recent CT negative 3d pta). Critical care services are indicated for:    Principal Problem:    Pneumonia  Active Problems:    Tobacco use disorder    AKI (acute kidney injury) (CMS-HCC)    Recurrent major depressive disorder, in partial remission (CMS-HCC)    Hypogammaglobulinemia (CMS-HCC)    ALL (acute lymphoblastic leukemia) (CMS-HCC)    Respiratory failure with hypoxia (CMS-HCC)    Chronic pain  Resolved Problems:    * No resolved hospital problems. *        Assessment & Plan     N: cont home gaba, elavil, baseline deaf Rt ear s/p lasix/vanc, fent/prop gtts for RASS 2- to 3-, scheduled oxy/apap for pleuritic chest pain and CBP, sedation needed for vent compliance,   P: bipap prior to intubation w persistent hypoxia and inc WOB, intubated 7/31 significant derecruitment (desat to 70s) s/p RSI w reduced BBS and air trapping >> dec I time, peep and RR, Slow to re-recruit and desats w HOB flat and turn to Right (shunt phys), cont duoneb scheduled, hold steroids for copd  CV: normotensive, echo wnl  GI: tube feed started, adv as tol to goal, cont bowel regimen  R:s/p 2L LR for metab acidosis in setting of AKI, bicarb added, pred inc to 20 for DRESS.  H: heparin ppx, pvl neg,   E: accu checks  Onc: hold chemo infusion for now,   ID: ppx w cresemba, bactrim, valtrex, cont linezolid, mero, mica >> changed mero to cefepime and added azithr per ICID recs, prelim cx w [facility] bacteremia and pna, MRSA screen neg    Critical Care Attestation     This patient is critically ill or injured with the impairment of vital organ systems such that there is a high probability of imminent or life threatening deterioration in the patient's condition. This patient must remain in the ICU for ongoing evaluation of the comprehensive management plan outlined in this note. I directly provided critical care services as documented in this note and the critical care time spent (40  min) is exclusive of separately billable procedures.  In addition to time spent for critical care management, I also provided advance care planning services for 0  minutes (see ACP note for details)???. Total billable critical care time 40  minutes.    Lanae Crumbly, NP

## 2020-11-03 NOTE — Unmapped (Signed)
IMMUNOCOMPROMISED HOST INFECTIOUS DISEASE PROGRESS NOTE    Assessment/Plan:     Adam 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  - 4/21 BMBx normocellular bone marrow 50% w/ 1% blasts (MRD)  - Extent of disease/CNS involvement: rare blast on prior CSF, intrathecal??ppx (cytarabine, methotrexate, hydrocortisone) last 09/18/2020  - Cancer-related complications: TLS, hyperbilirubinemia, MRSA bacteremia w/ septic emboli/renal failure+dialysis/intubation, C. krusei fungemia  - Prior chemotherapy: [id number] induction with dasatinib (vincristine, dexamethasone and dasatinib); C1D1 01/24/2020; difficulty tolerating single-agent dasatinib  - Current chemotherapy: blinatumomab (anti-CD19/CD3), C1D1 = 3/18 (symtomatic, titrated up dose and restarted 3/21) + ponatinib (TKI)  - currently cycle 3/5  -??Right IJ Port-A-Cath??placed 04/15/2020  - Infection prophylaxis prior to admission: Bactrim 1 DS MWF, valacyclovir, s/p Evusheld 04/21/2020 x2 doses, isavuconazole  - on pred 15mg  daily as below  ??  Likely DIHS/DRESS, 06/12/2020 and new rash 8/01  - followed by Dr. Caryn Section, dermatology  - 3/11 punch bx  - per prior ID notes possible culprits ceftaroline, posaconazole, dasatinib, sotrovimab  - received high dose steroids in 06/2020, 3/3 - 3/7 pred 50 x5d; 06/19/20 started at 50 mg prednisone taper  -??flare on pred 10 mg daily, currently pred 15mg  daily  - pink rash noted on chest on 8/01, with rising eosinophils. Dermatology consulted and noted concern for DRESS flare, so cefepime discontinued  - 8/02 improvement in rash and resolution in eosinophilia  ??  Pertinent Co-morbidities  None  ??  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  Infection History  Active infections:??  #MRSA bacteremia, 11/01/2020  #RLL pulmonary consolidation on 11/01/2020 CXR, new from 7/28 CT  - 7/31 BCx 1/2 Staph aureus  - 7/31 LRCx Staph aureus  Rx: 7/31 linezolid/cefepime --> linezolid/daptomycin/cefepime -->8/02 linezolid/daptomycin  ??  #Non-neutropenic fever and new pulmonary nodules/infiltrates, 07/17/20  - 4/15 BC x2 NGTD  - 4/16 CT Chest: multifocal somewhat nodular consolidative opacities in b/l lungs that are relatively new, most concerning for infectious PNA (possibly fungal, especially given his recent high dose steroid taper)  - 4/17 LRCx +2??MRSA  - 4/17 Legionella uAg, serum crypto Ag, serum Asp galactomannan, urine histo ag, B-D-glucan (Fungitell), serum CMV neg  - 4/18 bronch with GNF:AOZH cx + MRSA, OP flora,??RPP negative, Legionella cx, AFB cx, fungal cx, CMV PCR, PJP DFA, Asp galactomannan neg  - 7/28 CT chest resolved  Rx:  4/15 cefepime/dapto --> 4/16 cefepime --> 4/18 cefepime/isavu/linezolid -> 4/19 linezolid --> 4/21 isavuconazole/linezolid --> 5/10 isavuconazole -->  ??  #Intertrigo in groin, 11/01/2020  ??  Prior infections:  ??  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  #COVID-19 Pneumonia, pos 05/28/2020 and 09/21/2020  - vaccinated 02/2020, 03/2020  - s/p Evusheld x1 04/21/2020  - prescribed molnupiravir at home, per prior notes did not take  - admitted 2/27 - 06/01/20 for observation, given sotrovimab  ??  #MRSA bacteremia + PNA + TV IE 01/27/20  - bacteremic 10/25-11/6, cleared 11/8  - pyomyositis thoracic spine, L arm  - 10/29 DISRUPT trial, exebacase  v. Placebo  Tx: 10/25 vanc/cefepime -->??10/27 dapto/ceftaroline -->??11/5 ceftaroline/vanc ??->??11/16 vanc (stopped due to ototoxicity though other meds were more likely) -->Ceftaroline (w/ 1 dose DAP) 11/28 ->12/02-->Vanc 12/02 - ?  ??  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  #tree-in-bud nodularity, resolving on CT 04/28/20  ??  Antimicrobial Intolerance/allergy  ??  Cefepime - rash with concern for DRESS flare  Ceftaroline - DRESS  Dapsone - possible agranulocytosis, per chart anaphylaxis  Vancomycin - probable ototoxicity (in combination with furosemide)  Isavuconazole - elevated LFTs in the setting of TKI       RECOMMENDATIONS FOR 11/03/2020    Diagnostic  ?? Trend BCx q48h until clear (drawn today)  ?? Follow up urine histo Ag  ??  Antibiotic toxicity monitoring  ?? At least weekly CBC w/ diff, CMP, CK    Treatment  For MRSA bacteremia + suspected MRSA PNA  ?? CONT Daptomycin 10mg /kg (dosing per pharmacy), linezolid 600mg  q12h (via OGT or IV)  ?? Vanc locks to port if sufficient time without infusion  ?? Will require port removal  ??  For intertrigo  ?? Cont topical antifungal, clotrimazole or nystatin powder, to groin  ??  For hx pulmonary nodules  ?? CONT isavuconazole 372mg  daily (via OG or IV) for now  ??  For history of DRESS and new rash  ?? Appreciate dermatology consult  ?? Avoid cephalosporins  ?? Ok to increase steroids if concern for DRESS  ??  Prophylaxis  ?? CONT valacyclovir  ?? CONT Bactrim 1 DS tab MWF  ?? CONT isavuconazole  ??  Immunizations  ?? COVID-19 booster prior to discharge  ?? Needs Prevnar-20  ?? Shingrix  All can be given prior to discharge            The [facility] ID service will continue to follow.  Please page the ID Transplant/Liquid Oncology Fellow consult at [phone] with questions.  Patient discussed with Dr. Para March.    Sarita Haver, MSPH, MD  Fellow, [facility] Division of Infectious Diseases    Subjective:     Interval History: Clinically improving overnight, and now off pressors and more interactive, with plan to extubate. ROS limited by clinical status (intubated and sedated), but patient able to indicate that he is comfortable and has pain in his right side, not improved by analgesics. He was noting double vision when looking at near objects, but this may be improving per nurse.  History obtained from:patient and nurse.    Medications:  Antimicrobials:  Anti-infectives (From admission, onward)    Start     Dose/Rate Route Frequency Ordered Stop    11/01/20 1700  DAPTOmycin (CUBICIN) 1,150 mg in sodium chloride (NS) 0.9 % 50 mL IVPB         10 mg/kg ?? 113.4 kg  160 mL/hr over 30 Minutes Intravenous Every 24 hours 11/01/20 1630 11/15/20 1659    11/01/20 0900  isavuconazonium sulfate (CRESEMBA) capsule 372 mg         372 mg Oral Daily (standard) 11/01/20 0531      11/01/20 0900  linezolid in dextrose 5% (ZYVOX) 600 mg/300 mL IVPB 600 mg         600 mg  300 mL/hr over 60 Minutes Intravenous Every 12 hours scheduled 11/01/20 0531 11/11/20 0859          Prior/Current immunomodulators: prednisone 20 mg daily, blinatumomab (anti-CD19/CD3)    Other medications reviewed.    Objective:     Vital Signs last 24 hours:  Core Temp:  [37.2 ??C (98.96 ??F)-37.6 ??C (99.68 ??F)] 37.2 ??C (98.96 ??F)  Heart Rate:  [81-104] 94  SpO2 Pulse:  [81-103] 94  Resp:  [13-26] 16  BP: (137)/(81) 137/81  A BP-2: (99-191)/(57-85) 191/85  MAP:  [70 mmHg-117 mmHg] 117 mmHg  FiO2 (%):  [50 %-80 %] 50 %  SpO2:  [90 %-95 %] 90 %    Physical Exam:  Patient Lines/Drains/Airways Status     Active Active Lines, Drains, & Airways     Name Placement date Placement time Site Days    ETT  7.5 11/01/20  1400  -- 1    Power Port--a-Cath Single Hub 03/10/20 Right Internal jugular 03/10/20  0935  Internal jugular  237    NG/OG Tube 16 Fr. Left nostril 11/01/20  1400  Left nostril  1    Urethral Catheter 11/01/20  1600  --  1    Peripheral IV 11/01/20 Left Forearm 11/01/20  0330  Forearm  2    Peripheral IV 11/01/20 Anterior;Proximal;Right Forearm 11/01/20  1200  Forearm  1    Arterial Line 11/01/20 Left Radial 11/01/20  1200  Radial  1              GEN:  intubated, sedated, able to nod/shake head and make thumbs up sign  EYES: sclerae anicteric and non injected  ZOX:WRUEA mucous membranes, intubated  LYMPH:no cervical or supraclavicular LAD  CV:RRR, no abnormal heart sounds noted and no peripheral edema  PULM:intubated and ventilated, TTP on RL chest  VW:UJWJ, NTND  XB:JYNWGNFA  RECTAL:deferred  SKIN:light pink rash on bilateral chest, resolving  MSK:no swollen joints  NEURO:sedated, nodding and shaking head  PSYCH:unable to assess    Data for Medical Decision Making     Recent Labs   Lab Units 11/03/20  0506 11/02/20  2331 11/02/20  2021 11/02/20  1712 11/02/20  1114 11/02/20  0827 11/02/20  0510 11/01/20  1646 11/01/20  1547 11/01/20  1427 11/01/20  1322 11/01/20  1322 11/01/20  0628 11/01/20  0148 11/01/20  0004 10/27/20  1127   WBC 10*9/L 13.1*  --   --   --   --   --  22.3*  --   --   --   --   --  25.7*  --  38.7* 16.7*   HEMOGLOBIN g/dL 9.9*  --   --   --   --   --  11.3*  --   --   --   --   --  13.6  --  16.1 13.8   HEMOGLOBIN BG g/dL  --   --   --   --   --  6.9*  --   --   --  13.3*  --  13.9  --   --   --   --    PLATELET COUNT (1) 10*9/L 173  --   --   --   --   --  192  --   --   --   --   --  198  --  185 225   NEUTRO ABS 10*9/L 12.5*  --   --   --   --   --  20.0*  --   --   --   --   --  24.2*  --  35.3* 13.8*   LYMPHO ABS 10*9/L 0.2*  --   --   --   --   --  0.4*  --   --   --   --   --  0.7*  --  1.1 1.7   EOSINO ABS 10*9/L 0.1  --   --   --   --   --  1.1*  --   --   --   --   --  0.0  --  0.0 0.3   BUN mg/dL 45*  --   --  42*  --   --  42*  --  36*  --   --   --  22 27* 21 18   CREATININE mg/dL 1.61*  --   --  0.96*  --   --  2.74*  --  2.44*  --   --   --  2.40* 2.46* 2.24* 1.82*   AST U/L 16  --   --   --   --   --  9  --   --   --   --   --  16 21  --  18   ALT U/L 22  --   --   --   --   --  27  --   --   --   --   --  48 61*  --  31   BILIRUBIN TOTAL mg/dL 0.2*  --   --   --   --   --  0.2*  --   --   --   --   --  0.3 0.5  --  0.3   ALK PHOS U/L 100  --   --   --   --   --  87  --   --   --   --   --  93 104  -- 89   POTASSIUM WHOLE BLOOD mmol/L 3.6 3.9 4.1  --    < > 3.4 4.3   < >  --  4.2   < > 5.4*  --   --   --   --    POTASSIUM mmol/L 4.3  --   --  4.3  --   --  4.5  --  4.8  --   --   --  4.4 4.4 - 4.4  --  4.2   MAGNESIUM mg/dL 2.5  --   --   --   --   --  2.2  --  1.3*  --   --   --  1.6  --  1.7 1.7   PHOSPHORUS mg/dL 4.2  --   --   --   --   --  5.3*  --  4.8  --   --   --  5.4*  --  5.1 3.5   CALCIUM mg/dL 8.1*  --   --  7.9*  --   --  8.3*  --  7.5*  --   --   --  8.9 9.8 9.8 9.0   CK TOTAL U/L  --   --   --   --   --   --   --   --  38.0*  --   --   --   --   --   --   --     < > = values in this interval not displayed.       New Micro Data  7/31 BCx MRSA in 1/2   7/31 LRCx Staph aureus  8/01 urine legionella Ag negative  8/01 SCrAg negative  8/01 urine histo Ag pending    Recent Studies  CXR 7/30:  Patchy opacification  along the medial right lung base with asymmetrical elevation of the hemidiaphragm, may reflect atelectasis, however, developing infectious process (e.g. pneumonia) is not entirely excluded.    CXR 7/31:  1.Interval placement of an endotracheal tube with tip terminating above the level of the carina.  2.Suspect small-to-moderate bilateral pleural effusions with associated passive atelectasis of adjacent lung parenchyma (right greater than left).  3.Additional findings suggestive of mild interstitial pulmonary edema, as above.    Abdominal XR 8/01:  Esophagogastric tube in appropriate positioning.    TTE 8/01:    1. No evidence of valvular or endocardial vegetation.    2. The left ventricle is normal in size with normal wall thickness.    3. The left ventricular systolic function is normal, LVEF is visually  estimated at > 55%.    4. The right ventricle is normal in size, with normal systolic function.    PVL VENOUS DUPLEX LOWER EXTREMITY BILATERAL 8/01:  Right  There is no evidence of DVT in the lower extremity. There is no evidence of obstruction proximal to the inguinal ligament or in the common femoral vein.  Left  There is no evidence of DVT in the lower extremity. There is no evidence of obstruction proximal to the inguinal ligament or in the common femoral vein.    PVL VENOUS DUPLEX UPPER EXTREMITY BILATERAL 8/01:  Right  No evidence of DVT detected in the central veins or arm veins.  Left  No evidence of DVT detected in the central veins or arm veins.     CXR 8/02:   No acute changes.

## 2020-11-03 NOTE — Unmapped (Signed)
Dermatology Inpatient Progress Note    Assessment/Recommendations:    Flare of Drug-induced Hypersensitivity/DRESS Syndrome in setting of missed prednisone doses while critically ill  - Drug-induced Hypersensitivity Syndrome (DIHS), also known as DRESS Syndrome (Drug Reaction with Eosinophilia and Systemic Symptoms), is a severe, potentially life-threatening drug reaction with delayed onset. DIHS/DRESS can encompass a spectrum of findings including morbilliform rash, fever, lymphadenopathy, and end-organ damage. It is important to note that there are no specific diagnostic tests to confirm a diagnosis of DRESS/DIHS; it is a diagnosis of exclusion. Notably, skin biopsy cannot reliably differentiate between a simple/morbilliform drug eruption and DIHS/DRESS.   - Prior H&E from outpatient dermatology visit on 3/11 consistent with an eczematous drug reaction. Given his elevated eosinophil count, concern was for DRESS at that time. He had a calculated RegiSCAR score of 4. Possible drug culprits include vancomycin and multiple cephalosporins, specifically ceftaroline.  - Patient was on a prednisone taper for known DRESS. His most recently tolerated home dose was prednisone 15mg  which was last taken on 7/30.  - Patient began flaring with erythema of the neck, chest, and knees and absolute eosinophils up from 0 on 7/31 to 1.1 on 8/1 in the setting of missing 2 days of prednisone. Patient was started on prednisone 20mg  daily 8/1 PM and absolute eosinophils dropped to 0.1 on 8.2. This is likely due to missing about 48 hrs of prednisone and possibly due to initiation of cefepime and vancomycin for his pneumonia with MRSA bacteremia. Patient is no longer on vancomycin or cefepime. LFTs are stable and creatinine is improving.  ??  Recommendations:    ?? Please avoid cephalosporins and vancomycin and list as severe allergy.   ?? Recommend prednisone 20 mg daily given flare of DRESS until patient has outpatient follow up with dermatology at which time taper can be discussed. We will help facilitate follow up in our clinic.  ?? Recommend cake-layer frosting of triamcinolone 0.1% ointment twice daily to erythematous areas (chest, neck, R arm); please order [id number] jar of ointment to bedside.  ?? Please trend CBC w/diff and CMP daily to watch for flaring of eosinophils or changes in LFTs.  ?? Follow up HHV6, HHV8, and EBV which are pending, as viral reactivation can be triggering factor for DIHS/DRESS. CMV negative.  ?? Should hypotension occur, consider checking AM cortisol to rule out adrenal insufficiency, as this is also associated with DIHS/DRESS.  ?? Recommend checking TSH and Free T4 to rule out eosinophilic infiltration of thryoid.  ??  Vesicles and Bullae on the edematous RUE  - Vesicles on the lower arm appear to be associated w/ edema. Skin was likely already fragile in the setting of flaring DRESS.  - Bullae are located near the adhesive covering an IV site so there may be a component of allergic contact dermatitis.  - Recommend elevation of RUE.  - Defer volume management to primary team.    Thank you for the consult. Dermatology will sign off. Please page [phone] should any additional questions or change in patient???s status arise (worsening erythematous rash, elevation in eosinophils) requiring re-evaluation.     _____________________________________________________________________________________    HPI:    This AM: Patient awake, but still intubated when seen this AM. Wife at bedside. Patient denies pruritus or skin discomfort. Wife felt that erythema over the knees had improved. Erythema on chest and neck was stable. Patient's right arm is edematous and has vesicles and bullae.    This PM: Patient successfully extubated. Patient reports taking prednisone  15mg  daily at home. He denies skin itching or discomfort.    Pertinent PMH:  Reviewed in epic  Patient Active Problem List   Diagnosis   ??? Tobacco use disorder   ??? AKI (acute kidney injury) (CMS-HCC)   ??? Transaminitis   ??? 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 Hyperphosphatemia   ??? MRSA bacteremia   ??? Septic shock due to Staphylococcus aureus (CMS-HCC)   ??? Hyperkalemia   ??? Hiccups   ??? Hypernatremia   ??? Red blood cell antibody positive   ??? Hypokalemia   ??? Hypomagnesemia   ??? COVID-19   ??? Dyspnea   ??? Recurrent major depressive disorder, in partial remission (CMS-HCC)   ??? PTSD (post-traumatic stress disorder)   ??? Anxiety   ??? Insomnia   ??? Fever   ??? Immunocompromised (CMS-HCC)   ??? Hypogammaglobulinemia (CMS-HCC)   ??? ALL (acute lymphoblastic leukemia) (CMS-HCC)   ??? Respiratory failure with hypoxia (CMS-HCC)   ??? Pneumonia   ??? Chronic pain   ??? Acute kidney injury superimposed on CKD (CMS-HCC)   ??? COPD (chronic obstructive pulmonary disease) (CMS-HCC)       ROS: Negative except as noted in HPI.    Vitals:    11/03/20 1000 11/03/20 1100 11/03/20 1143 11/03/20 1200   BP:       Pulse: 97 92 83 80   Resp: 20 17 15 14    Temp:       TempSrc:       SpO2: 96% 92% 95% 95%   Weight:       Height:           Physical Exam:  GEN: ill-appearing in NAD  NEURO: alert and oriented, interacts appropriately  SKIN: Examination with inspection and palpation of the head, neck, chest,  abdomen,  right upper extremity, left upper extremity, right lower extremity, left lower extremity, was performed and notable for the following. All other areas examined were normal or had no significant findings.  - Patches of erythema on the neck and chest improved from prior  - Faint erythematous patches on the knees resolved  - New vesicles and bullae on edematous on stable pink R arm. The bullae are located near adhesive.                  Pertinent labs / studies:    Recent Labs   Lab Units 11/03/20  0506 11/02/20  0827 11/02/20  0510 11/01/20  1322 11/01/20  1133 11/01/20  0628   WBC 10*9/L 13.1*  --  22.3*  --   --  25.7*   RBC 10*12/L 3.06*  --  3.50*  --   --  4.19*   HEMOGLOBIN g/dL 9.9* --  16.1*  --   --  13.6   HEMOGLOBIN BG g/dL  --  6.9*  --    < >  --   --    HEMATOCRIT % 28.8*  --  32.9*  --   --  39.6   MCV fL 94.3  --  94.0  --   --  94.6   MCH pg 32.5*  --  32.2  --   --  32.5*   MCHC g/dL 09.6  --  04.5  --   --  34.3   RDW % 16.4*  --  16.0*  --   --  16.1*   PLATELET COUNT (1) 10*9/L 173  --  192  --   --  198   MPV fL 8.8  --  8.7  --   --  8.3   HUMAN METAPNEUMOVIRUS PCR   --   --   --   --  Not Detected  --     < > = values in this interval not displayed.      Recent Labs   Lab Units 11/03/20  1015 11/03/20  0506 11/02/20  2021 11/02/20  1712 11/02/20  0827 11/02/20  0510 11/01/20  1322 11/01/20  0628   SODIUM WHOLE BLOOD mmol/L 136 133*   < >  --    < > 133*   < >  --    SODIUM mmol/L  --  133*  --  135  --  134*   < > 132*   POTASSIUM WHOLE BLOOD mmol/L 3.7 3.6   < >  --    < > 4.3   < >  --    POTASSIUM mmol/L  --  4.3  --  4.3  --  4.5   < > 4.4   CHLORIDE mmol/L  --  105  --  108*  --  106   < > 102   CO2 mmol/L  --  22.0  --  21.0  --  20.0   < > 22.0   BUN mg/dL  --  45*  --  42*  --  42*   < > 22   CREATININE mg/dL  --  5.62*  --  1.30*  --  2.74*   < > 2.40*   GLUCOSE mg/dL  --  865  --  784  --  115   < > 104*   CALCIUM mg/dL  --  8.1*  --  7.9*  --  8.3*   < > 8.9   ALBUMIN g/dL  --  2.0*  --   --   --  2.2*  --  3.2*   PROTEIN TOTAL g/dL  --  4.9*  --   --   --  4.9*  --  6.4   BILIRUBIN TOTAL mg/dL  --  0.2*  --   --   --  0.2*  --  0.3   ALK PHOS U/L  --  100  --   --   --  87  --  93   ALT U/L  --  22  --   --   --  27  --  48   AST U/L  --  16  --   --   --  9  --  16    < > = values in this interval not displayed.     Absolute eosinophils 0.1 down from 1.1 yesterday

## 2020-11-03 NOTE — Unmapped (Signed)
_______________________________________________________________________    Attending attestation  I saw and evaluated the patient. I discussed and agree with the findings and the plan of care as documented in the fellow???s note.The patient is at risk of decompensation from infection due to underlying immunocompromise and/or mucosal barrier defects. The risks and benefits of initiating and/or continuing potentially toxic anti-infective therapies and diagnostic procedures were discussed with the care teams.     I personally reviewed updated microbiological culture and susceptibility data.     I personally reviewed updated relevant radiological studies.    Jori Moll, MD  Immunocompromised ID  Pager [phone]  _______________________________________________________________________    IMMUNOCOMPROMISED HOST INFECTIOUS DISEASE PROGRESS NOTE    Assessment/Plan:     Adam 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  - 4/21 BMBx normocellular bone marrow 50% w/ 1% blasts (MRD)  - Extent of disease/CNS involvement: rare blast on prior CSF, intrathecal??ppx (cytarabine, methotrexate, hydrocortisone) last 09/18/2020  - Cancer-related complications: TLS, hyperbilirubinemia, MRSA bacteremia w/ septic emboli/renal failure+dialysis/intubation, C. krusei fungemia  - Prior chemotherapy: GRAAPPH-2005 induction with dasatinib (vincristine, dexamethasone and dasatinib); C1D1 01/24/2020; difficulty tolerating single-agent dasatinib  - Current chemotherapy: blinatumomab (anti-CD19/CD3), C1D1 = 3/18 (symtomatic, titrated up dose and restarted 3/21) + ponatinib (TKI)  - currently cycle 3/5  -??Right IJ Port-A-Cath??placed 04/15/2020  - Infection prophylaxis prior to admission: Bactrim 1 DS MWF, valacyclovir, s/p Evusheld 04/21/2020 x2 doses, isavuconazole  - on pred 15mg  daily as below  ??  Likely DIHS/DRESS, 06/12/2020 and new rash 8/01  - followed by Dr. Caryn Section, dermatology  - 3/11 punch bx  - per prior ID notes possible culprits ceftaroline, posaconazole, dasatinib, sotrovimab  - received high dose steroids in 06/2020, 3/3 - 3/7 pred 50 x5d; 06/19/20 started at 50 mg prednisone taper  -??flare on pred 10 mg daily, currently pred 15mg  daily  - pink rash noted on chest on 8/01, with rising eosinophils  ??  Pertinent Co-morbidities  None  ??  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  Infection History  Active infections:??  #MRSA bacteremia, 11/01/2020  #RLL pulmonary consolidation on 11/01/2020 CXR, new from 7/28 CT  - 7/31 BCx 1/2 Staph aureus  Rx: 7/31 linezolid/cefepime --> linezolid/daptomycin/cefepime  ??  #Non-neutropenic fever and new pulmonary nodules/infiltrates, 07/17/20  - 4/15 BC x2 NGTD  - 4/16 CT Chest: multifocal somewhat nodular consolidative opacities in b/l lungs that are relatively new, most concerning for infectious PNA (possibly fungal, especially given his recent high dose steroid taper)  - 4/17 LRCx +2??MRSA  - 4/17 Legionella uAg, serum crypto Ag, serum Asp galactomannan, urine histo ag, B-D-glucan (Fungitell), serum CMV neg  - 4/18 bronch with UJW:JXBJ cx + MRSA, OP flora,??RPP negative, Legionella cx, AFB cx, fungal cx, CMV PCR, PJP DFA, Asp galactomannan neg  - 7/28 CT chest resolved  Rx:  4/15 cefepime/dapto --> 4/16 cefepime --> 4/18 cefepime/isavu/linezolid -> 4/19 linezolid --> 4/21 isavuconazole/linezolid --> 5/10 isavuconazole -->  ??  #Intertrigo in groin, 11/01/2020  ??  Prior infections:  ??  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  #COVID-19 Pneumonia, pos 05/28/2020 and 09/21/2020  - vaccinated 02/2020, 03/2020  - s/p Evusheld x1 04/21/2020  - prescribed molnupiravir at home, per prior notes did not take  - admitted 2/27 - 06/01/20 for observation, given sotrovimab  ??  #MRSA bacteremia + PNA + TV IE 01/27/20  - bacteremic 10/25-11/6, cleared 11/8  - pyomyositis thoracic spine, L arm  - 10/29 DISRUPT trial, exebacase v. Placebo  Tx: 10/25 vanc/cefepime -->??10/27 dapto/ceftaroline -->??11/5 ceftaroline/vanc ??->??11/16 vanc (stopped due to ototoxicity though other meds were more likely) -->Ceftaroline (w/ 1 dose DAP) 11/28 ->12/02-->Vanc 12/02 - ?  ??  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  #tree-in-bud nodularity, resolving on CT 04/28/20  ??  Antimicrobial Intolerance/allergy  ??  Ceftaroline - DRESS  Dapsone - possible agranulocytosis, per chart anaphylaxis  Vancomycin - probable ototoxicity (in combination with furosemide)  Isavuconazole - elevated LFTs in the setting of TKI       RECOMMENDATIONS FOR 11/02/2020    Diagnostic  ?? Trend BCx q48h until clear (due 8/02)  ?? F/u 7/31 BCx, 7/31 LRCx, urine histo Ag, serum crypto Ag  ?? Dermatology consult for rash and esoinophilia  ?? Remove port when able  ??  Antibiotic toxicity monitoring  ?? At least weekly CBC w/ diff, CMP, CK    Treatment  For MRSA bacteremia + suspected MRSA PNA  ?? CONT Daptomycin 10mg /kg (dosing per pharmacy), linezolid 600mg  q12h (via OGT or IV)  ?? Vanc locks to port if sufficient time without infusion  ?? Will require port removal  ?? STOP cefepime 2g q8h equivalent--concern for rising eos and rash  ??  For intertrigo  ?? Cont topical antifungal, clotrimazole or nystatin powder, to groin  ??  For hx pulmonary nodules  ?? CONT isavuconazole 372mg  daily (via OG or IV) for now  ??  For history of DRESS and new rash  ?? Dermatology consult  ?? STOP cefepime  ?? Ok to increase steroids if concern for DRESS  ??  Prophylaxis  ?? CONT valacyclovir  ?? CONT Bactrim 1 DS tab MWF  ??  Immunizations  ?? COVID-19 booster prior to discharge  ?? Needs Prevnar-20  ?? Shingrix  All can be given prior to discharge            The [facility] ID service will continue to follow.  Please page the ID Transplant/Liquid Oncology Fellow consult at [phone] with questions.  Patient discussed with Dr. Kari Baars.    Sarita Haver, MSPH, MD  Fellow, [facility] Division of Infectious Diseases    Subjective:     Interval History: Clinically improving overnight, with decreasing norepinephrine. ROS limited by clinical status (intubated and sedated), but patient able to indicate that he is comfortable and not in any pain. Family and primary team note a new rash on the chest.  History obtained from:patient, family member and nurse.    Medications:  Antimicrobials:  Anti-infectives (From admission, onward)    Start     Dose/Rate Route Frequency Ordered Stop    11/01/20 1700  DAPTOmycin (CUBICIN) 1,150 mg in sodium chloride (NS) 0.9 % 50 mL IVPB         10 mg/kg ?? 113.4 kg  160 mL/hr over 30 Minutes Intravenous Every 24 hours 11/01/20 1630 11/15/20 1659    11/01/20 0900  isavuconazonium sulfate (CRESEMBA) capsule 372 mg         372 mg Oral Daily (  standard) 11/01/20 0531      11/01/20 0900  linezolid in dextrose 5% (ZYVOX) 600 mg/300 mL IVPB 600 mg         600 mg  300 mL/hr over 60 Minutes Intravenous Every 12 hours scheduled 11/01/20 0531 11/11/20 0859          Prior/Current immunomodulators: prednisone 15 mg daily, blinatumomab (anti-CD19/CD3)    Other medications reviewed.    Objective:     Vital Signs last 24 hours:  Core Temp:  [37.2 ??C (98.96 ??F)-38.3 ??C (100.94 ??F)] 37.5 ??C (99.5 ??F)  Heart Rate:  [80-104] 90  SpO2 Pulse:  [79-103] 90  Resp:  [15-35] 26  BP: (102-137)/(61-81) 137/81  MAP (mmHg):  [71] 71  A BP-2: (74-140)/(48-85) 105/57  MAP:  [57 mmHg-103 mmHg] 71 mmHg  FiO2 (%):  [70 %-100 %] 70 %  SpO2:  [91 %-97 %] 92 %    Physical Exam:  Patient Lines/Drains/Airways Status     Active Active Lines, Drains, & Airways     Name Placement date Placement time Site Days    ETT  7.5 11/01/20  1400  -- 1    Power Port--a-Cath Single Hub 03/10/20 Right Internal jugular 03/10/20  0935  Internal jugular  237    NG/OG Tube 16 Fr. Left nostril 11/01/20  1400  Left nostril  1    Urethral Catheter 11/01/20  1600  --  1    Peripheral IV 11/01/20 Right Forearm 11/01/20  0135  Forearm  1    Peripheral IV 11/01/20 Left Forearm 11/01/20  0330  Forearm  1    Peripheral IV 11/01/20 Anterior;Proximal;Right Forearm 11/01/20  1200  Forearm  1    Arterial Line 11/01/20 Left Radial 11/01/20  1200  Radial  1              GEN:  intubated, sedated, able to nod/shake head  EYES: sclerae anicteric and non injected  XLK:GMWNU mucous membranes, intubated  LYMPH:no cervical or supraclavicular LAD  CV:RRR, no abnormal heart sounds noted and no peripheral edema  PULM:intubated and ventilated  UV:OZDG, NTND  UY:QIHKVQQV  RECTAL:deferred  SKIN:light pink rash on bilateral chest  MSK:no swollen joints  NEURO:sedated, nodding and shaking head  PSYCH:unable to assess    Data for Medical Decision Making     Recent Labs   Lab Units 11/02/20  1602 11/02/20  1114 11/02/20  0827 11/02/20  0510 11/01/20  1646 11/01/20  1547 11/01/20  1427 11/01/20  1322 11/01/20  1322 11/01/20  0628 11/01/20  0148 11/01/20  0004 10/27/20  1127   WBC 10*9/L  --   --   --  22.3*  --   --   --   --   --  25.7*  --  38.7* 16.7*   HEMOGLOBIN g/dL  --   --   --  95.6*  --   --   --   --   --  13.6  --  16.1 13.8   HEMOGLOBIN BG g/dL  --   --  6.9*  --   --   --  13.3*  --  13.9  --   --   --   --    PLATELET COUNT (1) 10*9/L  --   --   --  192  --   --   --   --   --  198  --  185 225   NEUTRO ABS 10*9/L  --   --   --  20.0*  --   --   --   --   --  24.2*  --  35.3* 13.8*   LYMPHO ABS 10*9/L  --   --   --  0.4*  --   --   --   --   --  0.7*  --  1.1 1.7   EOSINO ABS 10*9/L  --   --   --  1.1*  --   --   --   --   --  0.0  -- 0.0 0.3   BUN mg/dL  --   --   --  42*  --  36*  --   --   --  22 27* 21 18   CREATININE mg/dL  --   --   --  1.61*  --  2.44*  --   --   --  2.40* 2.46* 2.24* 1.82*   AST U/L  --   --   --  9  --   --   --   --   --  16 21  --  18   ALT U/L  --   --   --  27  --   --   --   --   --  48 61*  --  31   BILIRUBIN TOTAL mg/dL  --   --   --  0.2*  --   --   --   --   --  0.3 0.5  --  0.3   ALK PHOS U/L  --   --   --  87  --   --   --   --   --  93 104  --  89   POTASSIUM WHOLE BLOOD mmol/L 4.0 3.5 3.4 4.3   < >  --  4.2   < > 5.4*  --   --   --   --    POTASSIUM mmol/L  --   --   --  4.5  --  4.8  --   --   --  4.4 4.4 - 4.4  --  4.2   MAGNESIUM mg/dL  --   --   --  2.2  --  1.3*  --   --   --  1.6  --  1.7 1.7   PHOSPHORUS mg/dL  --   --   --  5.3*  --  4.8  --   --   --  5.4*  --  5.1 3.5   CALCIUM mg/dL  --   --   --  8.3*  --  7.5*  --   --   --  8.9 9.8 9.8 9.0   CK TOTAL U/L  --   --   --   --   --  38.0*  --   --   --   --   --   --   --     < > = values in this interval not displayed.       New Micro Data  7/31 BCx Staph aureus in 1/2   7/31 LRCx GPCs, no signs of GNRs per micro lab  8/01 urine legionella Ag negative  8/01 SCrAg pending  8/01 urine histo Ag pending    Recent Studies  CXR 7/30:  Patchy opacification along the medial right lung base with asymmetrical elevation of the hemidiaphragm, may reflect atelectasis, however, developing infectious process (e.g. pneumonia) is not entirely excluded.  CXR 7/31:  1.Interval placement of an endotracheal tube with tip terminating above the level of the carina.  2.Suspect small-to-moderate bilateral pleural effusions with associated passive atelectasis of adjacent lung parenchyma (right greater than left).  3.Additional findings suggestive of mild interstitial pulmonary edema, as above.    Abdominal XR 8/01:  Esophagogastric tube in appropriate positioning.    TTE 8/01:    1. No evidence of valvular or endocardial vegetation.    2. The left ventricle is normal in size with normal wall thickness.    3. The left ventricular systolic function is normal, LVEF is visually  estimated at > 55%.    4. The right ventricle is normal in size, with normal systolic function.

## 2020-11-03 NOTE — Unmapped (Signed)
Malignant Hematology Consult Note    Requesting Attending Physician :  [name], MD  Service Requesting Consult : Medical ICU (MDI)  Reason for Consult: B-ALL  Primary Oncologist: [name]    Assessment: Adam Keith is a 40 y.o. 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 Malignant hematology was consulted for B-ALL.     Blinatumomab is complete for this cycle. He can restart his ponatinib today. IT triple therapy due for this cycle, which can be deferred to outpatient.     Recommendations:   -Appreciate excellent care of MICU team, transfer to 4Onc as clinically able  -Appreciate ICID assistance in antibiosis  -Port removal scheduled 8/3 with VIR  -Continue holding blinatumomab   -Patient may resume Ponatinib - wife to bring in home supply  -Transfuse with leukoreduced blood products (irradiated preferred) for Hgb <7 and platelets <10    This patient has been staffed with Dr. Barbette Merino. These recommendations were discussed with the primary team.     Please contact the malignant hematology fellow at [phone] with any further questions.    Malignant Hematology will continue to follow.    Adam Cogan, MD  PGY-4, Hematology/Oncology Fellow  [facility]  Pager: [phone]    -------------------------------------------------------------    HPI:  Adam Keith is a 40 y.o. 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 Malignant hematology was consulted for B-ALL. Patient unable to provide history so history obtained from chart review.    He presented to ED on 7/30 with 2 days of SOB, right sided chest pain, productive cough and chills. Initially only required 2L but had worsening respiratory symptoms requiring Bipap and then emergent intubation in MICU on 7/31. Currently intubated and sedated.     He had similar admission in 07/2020 with SOB, increased sputum, fever, and CT chest finding concerning for fungal infection.  Additionally, bronchoscopy performed during that admission, positive for MRSA.  Followed by ID and treated with lenzolid and isavuconazole.  He had recent CT chest on 7/28 that was negative for new or persistent infection.    Patient presented with new onset SOB, productive cough, and chills as well as right sided chest pain. HE did not report any altered mental status or neurologic changes.  CXR in ED with RLL opacity concerning for pneumonia. His respiratory status declined and he was emergently intubated in MICU.  Febrile to 38.9.  WBC elevated to 38.7 with 90+% neutrophils. He has been found to have MRSA bacteremia and suspected MRSA PNA. He has been treated with Daptomycin (7/31 - p) and Linezolid (7/31 - p) with marked improvement and extubation on 8/2.     Interval HPI:  Extubated on AM of 8/2. Off vasopressors since 8/1 afternoon. Will need his port removed per ICID    Review of Systems: Review of Systems - Unable to obtain as patient is sedated    Oncologic History:  Oncology History Overview Note   Referring/Local Oncologist: None    Diagnosis:Ph+ ALL    Genetics:    Karyotype/FISH:Abnormal Karyotype: 46,XY,t(9;22)(q34;q11.2)[1]/45,XY,der(7;9)(q10;q10)t(9;22)(q34;q11.2),der(22)t(9;22)[11]/46,sdl,+der(22)t(9;22)[5]/46,XY[3]     Abnormal FISH: A BCR/ABL1 interphase FISH assay shows an abnormal signal pattern in 97% of the 100 cells scored. Of note, 2/97 abnormal cells have an additional BCR/ABL1 fusion signal from the der(22) chromosome, consistent  with the additional copy of the der(22) seen in clone 3 by G-banding.  The findings support a diagnosis of leukemia and have implications for targeted therapy and for monitoring residual disease.      Molecular Genetics:  BCR-ABL1 p210 transcripts were detected at a level of 46.479 IS% ratio in bone marrow.  BCR-ABL1 p190 transcripts were detected at a level of 4 in 100,000 cells in bone marrow.    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)     01/24/2020 - 04/02/2020 Chemotherapy    IP/OP LEUKEMIA GRAAPH-2005 + RITUXIMAB < 60 YO  rituximab hypercvad (odd and even course)     02/24/2020 Remission    CR with PCR MRD+; marrow showing 70% blasts with <1% blasts, negative flow MRD, BCR-ABL p210 PCR 0.197%; CNS negative for blasts     03/09/2020 Progression    CSF - rare blast ID'ed - IT chemo given    03/13/20 - IT chemo, CSF negative     04/16/2020 Biopsy    BM Bx with 40-50% cellularity, <1% blasts, MRD flow cytometry negative, BCR-ABL p210 transcripts 0.036%     05/28/2020 - 05/28/2020 Chemotherapy    OP AML - CNS THERAPY (INTRATHECAL CYTARABINE, INTRATHECAL METHOTREXATE, OR INTRATHECAL TRIPLE)  Select one of the following: cytarabine IT 100 mg with hydrocortisone 50 mg, cytarabine IT 40 mg with methotrexate 15 mg with hydrocortisone 50 mg, OR methotrexate IT 12 mg with hydrocortisone 50 mg     06/19/2020 -  Chemotherapy    IP/OP LEUKEMIA BLINATUMOMAB 7-DAY INFUSION (MINIMAL RESIDUAL DISEASE; WT >= 22 KG) (HOME INFUSION)      Cycles 1*-4: Blinatumomab 28 mcg/day Days 1-28 of 6-week cycle.  *Given in the inpatient setting on Days 1-3 on Cycle 1 and Days 1-2 on Cycle 2, while other treatment days are given in the outpatient setting.    Cycle 1 MRD Dosing     07/18/2020 Adverse Reaction    Hospitalization: fevers. Pneumonia     07/23/2020 Biopsy    Diagnosis  Bone marrow, right iliac, aspiration and biopsy  -   Normocellular bone marrow (50%) with trilineage hematopoiesis and 1% blasts by manual aspirate differential  -   Flow cytometry MRD analysis reveals no definitive immunophenotypic evidence of residual B lymphoblastic leukemia   - BCR-ABL p210 0.006%         08/10/2020 -  Chemotherapy    Cycle 2 blinatumomab-ponatinib  Ponatinib 30mg     1 IT per cycle     09/21/2020 Adverse Reaction    Covid-19, symptomatic but not requiring hospitalization.     ALL (acute lymphoblastic leukemia) (CMS-HCC)   06/11/2020 -  Chemotherapy    IP/OP LEUKEMIA BLINATUMOMAB 7-DAY INFUSION (MINIMAL RESIDUAL DISEASE; WT >= 22 KG) (HOME INFUSION)  Cycles 1*-4: Blinatumomab 28 mcg/day Days 1-28 of 6-week cycle.  *Given in the inpatient setting on Days 1-3 on Cycle 1 and Days 1-2 on Cycle 2, while other treatment days are given in the outpatient setting.     09/21/2020 Initial Diagnosis    ALL (acute lymphoblastic leukemia) (CMS-HCC)         Medical, Surgical, Social, and Family History reviewed and updated.    Social History     Social History Narrative   ??? Not on file  Allergies: is allergic to bupropion hcl, ceftaroline fosamil, dapsone, onion, vancomycin analogues, bismuth subsalicylate, and furosemide.    Medications:   Meds:  ??? acetaminophen  1,000 mg Enteral tube: gastric  Q8H   ??? aspirin  81 mg Enteral tube: gastric  Daily   ??? DAPTOmycin  10 mg/kg Intravenous Q24H   ??? gabapentin  300 mg Enteral tube: gastric  TID   ??? heparin (porcine) for subcutaneous use  5,000 Units Subcutaneous Q8H [facility]   ??? ipratropium-albuteroL  3 mL Nebulization Q6H (RT)   ??? isavuconazonium sulfate  372 mg Oral Daily   ??? lidocaine  1 patch Transdermal Daily   ??? linezolid  600 mg Intravenous Q12H [facility]   ??? melatonin  3 mg Enteral tube: gastric  QPM   ??? nortriptyline  75 mg Enteral tube: gastric  Nightly   ??? oxyCODONE  20 mg Enteral tube: gastric  Q6H [facility]   ??? polyethylene glycol  17 g Enteral tube: gastric  BID   ??? predniSONE  20 mg Enteral tube: gastric  Daily   ??? senna  2 tablet Enteral tube: gastric  Nightly   ??? sodium bicarbonate  650 mg Enteral tube: gastric  QID   ??? sulfamethoxazole-trimethoprim  1 tablet Enteral tube: gastric  Once per day on Mon Wed Fri   ??? valACYclovir  500 mg Enteral tube: gastric  Daily     Continuous Infusions:    PRN Meds:.oxyCODONE    Objective:   Vitals: Core Temp:  [36.7 ??C (98.06 ??F)-37.6 ??C (99.68 ??F)] 36.9 ??C (98.42 ??F)  Heart Rate:  [80-101] 83  SpO2 Pulse:  [80-100] 83  Resp:  [13-26] 16  A BP-2: (105-191)/(57-87) 177/87  MAP:  [71 mmHg-117 mmHg] 114 mmHg  FiO2 (%):  [40 %-80 %] 80 %  SpO2:  [89 %-96 %] 93 %    Physical Exam:    GEN: Awake, alert, drinking water bottle in bed.   HEENT: sclerae anicteric, NGT remains in place  NECK: Supple, no JVD  HEME/LYMPH: No palpable cervical or supraclavicular lymphadenopathy  CVL: Right chest port with dressing c/d/i, no surrounding erythema, warmth, tenderness or drainage  CV: rrr  RESP: normal wob  GI: Soft, non-tender, non-distended, normoactive bowel sounds, no hepatosplenomegaly  EXT: warm, well-perfused, no trace LE edema  MSK:  No bony pain or tenderness.   SKIN: No ecchymoses  NEURO: A&Ox4, following commands  PSYCH: Cooperative, normal mood and affect      Test Results  Recent Labs     11/01/20  0628 11/01/20  1322 11/02/20  0510 11/02/20  0827 11/03/20  0506 11/03/20  1524   WBC 25.7*  --  22.3*  --  13.1*  --    NEUTROABS 24.2*  --  20.0*  --  12.5*  --    HGB 13.6   < > 11.3* 6.9* 9.9* 11.0*   PLT 198  --  192  --  173  --     < > = values in this interval not displayed.       Imaging: Radiology studies were personally reviewed

## 2020-11-03 NOTE — Unmapped (Signed)
Pt peak pressures have improved as well as BBS after suctioning and lavage for several mucous plugs and thick brown secretions.

## 2020-11-03 NOTE — Unmapped (Signed)
VASCULAR INTERVENTIONAL RADIOLOGY INPATIENT CVC CONSULTATION     Requesting Attending Physician: [name], MD  Service Requesting Consult: Medical ICU (MDI)    Date of Service: 11/03/2020  Consulting Interventional Radiologist: Dr. Orlando Penner     HPI:     Reason for consult: removal of tunneled HD catheter     History of Present Illness:   Adam [name] with h/o B-ALL, CKD, COPD/emphysema, MRSA bacteremia and candida fungemia. Right internal jugular Port-a-Cath placed by VIR on 03/10/20. Patient is currently admitted for sepsis with 1/2 blood cultures on 7/31 growing MRSA. Patient seen in consultation at the request of primary care team for consideration for port removal.    Review of Systems:  Pertinent items are noted in HPI.    Medical History:     Past Medical History:  Past Medical History:   Diagnosis Date   ??? 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 BONE MARROW BIOPSY & ASPIRATION  01/21/2020        ??? IR INSERT PORT AGE GREATER THAN 5 YRS  03/10/2020    IR INSERT PORT AGE GREATER THAN 5 YRS 03/10/2020 [name], MD IMG VIR [facility]   ??? PR BRONCHOSCOPY,DIAGNOSTIC W LAVAGE Bilateral 07/20/2020    Procedure: BRONCHOSCOPY, RIGID OR FLEXIBLE, INCLUDE FLUOROSCOPIC GUIDANCE WHEN PERFORMED; W/BRONCHIAL ALVEOLAR LAVAGE WITH MODERATE SEDATION;  Surgeon: Dellis Filbert, MD;  Location: BRONCH PROCEDURE LAB [facility];  Service: Pulmonary       Family History:  Family History   Problem Relation Age of Onset   ??? Melanoma Neg Hx    ??? Basal cell carcinoma Neg Hx    ??? Squamous cell carcinoma Neg Hx        Medications:   Current Facility-Administered Medications   Medication Dose Route Frequency Provider Last Rate Last Admin   ??? acetaminophen (TYLENOL) tablet 1,000 mg  1,000 mg Enteral tube: gastric  Q8H Honey Monet Gildardo Cranker, ACNP   1,000 mg at 11/03/20 1610   ??? aspirin chewable tablet 81 mg  81 mg Enteral tube: gastric  Daily Honey Lynnae January, ACNP   81 mg at 11/03/20 9604   ??? chlorhexidine (PERIDEX) 0.12 % solution 5 mL  5 mL Topical BID Ria Comment, MD   5 mL at 11/03/20 0743   ??? DAPTOmycin (CUBICIN) 1,150 mg in sodium chloride (NS) 0.9 % 50 mL IVPB  10 mg/kg Intravenous Q24H Ria Comment, MD   Stopped at 11/02/20 1749   ??? famotidine (PEPCID) tablet 20 mg  20 mg Enteral tube: gastric  Daily Honey Lynnae January, ACNP   20 mg at 11/03/20 1006   ??? fentaNYL (PF) (SUBLIMAZE) injection 25 mcg  25 mcg Intravenous Q30 Min PRN Honey Monet Gildardo Cranker, ACNP        Or   ??? fentaNYL (PF) (SUBLIMAZE) injection 50 mcg  50 mcg Intravenous Q30 Min PRN Honey Lynnae January, ACNP   50 mcg at 11/02/20 2018   ??? fentaNYL PF (SUBLIMAZE) (50 mcg/mL) infusion (bag)  0-200 mcg/hr Intravenous Continuous Honey Monet Gildardo Cranker, ACNP 2 mL/hr at 11/03/20 1003 100 mcg/hr at 11/03/20 1003   ??? gabapentin (NEURONTIN) capsule 300 mg  300 mg Enteral tube: gastric  TID Honey Monet Gildardo Cranker, ACNP   300 mg at 11/03/20 5409   ???  heparin (porcine) 5,000 unit/mL injection 5,000 Units  5,000 Units Subcutaneous [facility] Bertram Gala, MD   5,000 Units at 11/03/20 0506   ??? ipratropium-albuteroL (DUO-NEB) 0.5-2.5 mg/3 mL nebulizer solution 3 mL  3 mL Nebulization Q6H (RT) Bertram Gala, MD   3 mL at 11/03/20 0919   ??? isavuconazonium sulfate (CRESEMBA) capsule 372 mg  372 mg Oral Daily Bertram Gala, MD   372 mg at 11/03/20 1037   ??? linezolid in dextrose 5% (ZYVOX) 600 mg/300 mL IVPB 600 mg  600 mg Intravenous Q12H [facility] Bertram Gala, MD 300 mL/hr at 11/03/20 1037 600 mg at 11/03/20 1037   ??? melatonin tablet 3 mg  3 mg Enteral tube: gastric  QPM Honey Monet Gildardo Cranker, ACNP   3 mg at 11/02/20 1712   ??? norepinephrine 8 mg in dextrose 5 % 250 mL (32 mcg/mL) infusion PMB  0-30 mcg/min Intravenous Continuous Honey Monet Gildardo Cranker, ACNP   Paused at 11/02/20 1311   ??? nortriptyline (PAMELOR) capsule 75 mg  75 mg Enteral tube: gastric  Nightly Amy Ross Vota, PA   75 mg at 11/02/20 2017   ??? oxyCODONE (ROXICODONE) immediate release tablet 10 mg  10 mg Enteral tube: gastric  Q6H Honey Lynnae January, ACNP   10 mg at 11/03/20 0506   ??? polyethylene glycol (MIRALAX) packet 17 g  17 g Enteral tube: gastric  BID Honey Lynnae January, ACNP   17 g at 11/03/20 1610   ??? predniSONE (DELTASONE) tablet 20 mg  20 mg Enteral tube: gastric  Daily Amy Ladon Applebaum, PA   20 mg at 11/03/20 0854   ??? senna (SENOKOT) tablet 2 tablet  2 tablet Enteral tube: gastric  Nightly Amy Ladon Applebaum, PA   2 tablet at 11/02/20 2017   ??? sodium bicarbonate tablet 650 mg  650 mg Enteral tube: gastric  QID Amy Ladon Applebaum, PA       ??? sulfamethoxazole-trimethoprim (BACTRIM DS) 800-160 mg tablet 160 mg of trimethoprim  1 tablet Enteral tube: gastric  Once per day on Mon Wed Fri Honey Monet Gildardo Cranker, ACNP   160 mg of trimethoprim at 11/02/20 9604   ??? valACYclovir (VALTREX) tablet 500 mg  500 mg Enteral tube: gastric  Daily Honey Monet Gildardo Cranker, ACNP   500 mg at 11/03/20 5409       Allergies:  Bupropion hcl, Ceftaroline fosamil, Dapsone, Onion, Vancomycin analogues, Bismuth subsalicylate, and Furosemide    Social History:  Social History     Tobacco Use   ??? Smoking status: Former Smoker     Packs/day: 2.00     Types: Cigarettes     Quit date: 01/20/2020     Years since quitting: 0.7   ??? Smokeless tobacco: Former Neurosurgeon     Types: Musician Use   ??? Vaping Use: Never used       Objective:      Vital Signs:  Core Temp:  [37.2 ??C (98.96 ??F)-37.6 ??C (99.68 ??F)] 37.2 ??C (98.96 ??F)  Heart Rate:  [81-104] 97  SpO2 Pulse:  [81-103] 100  Resp:  [13-26] 20  BP: (137)/(81) 137/81  A BP-2: (99-191)/(57-85) 162/81  MAP:  [70 mmHg-117 mmHg] 107 mmHg  FiO2 (%):  [40 %-80 %] 40 %  SpO2:  [90 %-96 %] 96 %    Physical Exam:      Vitals:    11/03/20 1000   BP:    Pulse: 97  Resp: 20   Temp:    SpO2: 96%     ASA Grade: ASA 3 - Patient with moderate systemic disease with functional limitations  General: No apparent distress.  Lungs: Breathing comfortably on room air.  Heart:  Regular rate and rhythm.   Neuro: No obvious focal deficits.    Airway assessment: Class 4 - Can visualize hard palate only    Diagnostic Studies:  I reviewed all pertinent diagnostic studies, including:  CT chest 08/03/20    Labs:    Recent Labs     11/01/20  0628 11/01/20  1322 11/02/20  0510 11/02/20  0827 11/03/20  0506   WBC 25.7*  --  22.3*  --  13.1*   HGB 13.6   < > 11.3* 6.9* 9.9*   HCT 39.6  --  32.9*  --  28.8*   PLT 198  --  192  --  173    < > = values in this interval not displayed.     Recent Labs     11/02/20  0510 11/02/20  0827 11/02/20  1712 11/02/20  2021 11/03/20  0506 11/03/20  1015   NA 134* - 133*   < > 135   < > 133* - 133* 136   K 4.5 - 4.3   < > 4.3   < > 4.3 - 3.6 3.7   CL 106  --  108*  --  105  --    BUN 42*  --  42*  --  45*  --    CREATININE 2.74*  --  2.54*  --  2.55*  --    GLU 115  --  101  --  140  --     < > = values in this interval not displayed.     Recent Labs     11/01/20  0628 11/02/20  0510 11/03/20  0506   PROT 6.4 4.9* 4.9*   ALBUMIN 3.2* 2.2* 2.0*   AST 16 9 16    ALT 48 27 22   ALKPHOS 93 87 100   BILITOT 0.3 0.2* 0.2*     Recent Labs     11/01/20  0628   INR 1.19       Blood Cultures Pending:  Yes.  Does Anticoagulation need to be held:  Yes.    Assessment and Recommendations:     Mr. Siek is a 40 y.o. male admitted for MRSA bacteremia. VIR consulted for port removal.     Recommendations:  - Proceed with removal of Port- single lumen  - Anticipated procedure date: 11/04/20  - Please make NPO night prior to procedure  - Please ensure recent CBC, Creatinine, and INR are available    Informed Consent:  This procedure has been fully reviewed with the patient/patient???s authorized representative. The risks, benefits and alternatives have been explained, and the patient/patient???s authorized representative has consented to the procedure.  --The patient will accept blood products in an emergent situation.  --The patient does not have a Do Not Resuscitate order in effect.    Thank you for involving [place] in the care of this patient. Please page the VIR consult pager [phone]) with further questions, concerns, or if new issues arise.

## 2020-11-03 NOTE — Unmapped (Signed)
MICU Daily Progress Note     Date of Service: 11/03/2020    Problem List:   Principal Problem:    Pneumonia  Active Problems:    Tobacco use disorder    AKI (acute kidney injury) (CMS-HCC)    MRSA bacteremia    Recurrent major depressive disorder, in partial remission (CMS-HCC)    Immunocompromised (CMS-HCC)    Hypogammaglobulinemia (CMS-HCC)    ALL (acute lymphoblastic leukemia) (CMS-HCC)    Respiratory failure with hypoxia (CMS-HCC)    Chronic pain    Acute kidney injury superimposed on CKD (CMS-HCC)    COPD (chronic obstructive pulmonary disease) (CMS-HCC)  Resolved Problems:    * No resolved hospital problems. *      HPI: Adam [name] with PH+ B-ALL, COPD, hospitalization 07/2020 w/ MRSA bacteremia, Candida Krusei fungemia, active smoking, sp Covid infection 09/2020 followed by [facility] ID who presented to [facility] with worsening SOB x48 hours.    History provided by patient and wife. He was in his normal state of health until 10/29/20 when he started developing worsening SOB, productive cough and chills. Coughing spells were intermittently associated with he denies sick contacts, fever, diarrhea, dysuria, polyuria, aspiration or focalizing pain. Pt endorses taking all his medicines as prescribed including antifungal and ppx meds.     Of not pt hospitalized 08/2020 for induction therapy that was complicated by Septic Shock due to MRSA and Candida krusei fungemia, acute renal failure necessitating temporary iHD and AHRB requiring intubation and stay in MICU    Pt endorses being full code status and his decision maker is his wife as listed in epic.     Interval Events:  - weaned off propofol  - vent weaned     Neurological   Acute on Chronic Pain, Anxiety Followed by Palliative care  -- holding home oxycontin 15mg  bid, dilaudid 2mg  po q4 prn mod sev pain and klonipin 0.5mg  bid prn anxiety  - cont home gabapentin   - wean of fentanyl gtt - increase scheduled oxy to 20 q 6 hr and add oral PRN's. Depression  -- Continue home nortriptyline    Pulmonary   AHRF 2/2 Pneumonia with History of Intubation, tobacco abuse   Arrived in ed needing bipap placement 70%. Duonebs given 2/2 history of copd and wheezing RLL in area of consolidation. Pt down to 65% with transition to Bipap 10/5.  - amidst scheduled nebs, airway clearance and narcotic for splinting, pt progressed to needing intubation d/t inc WOB and worsening hypoxia  - RSI per anesthesia w difficulty oxygenating afterwards, likely r/t de-recruitment and shunt physiology  - cont duoneb scheduled q6  - weaned to 80% + 12 this am.  No desats with awakening today, but does with turn.  Cont to wean for PaO2 > 55%.  DP stable on PEEP of 12   - 8/2: stable on PS this am and passed SBT.  Extubated to cpap and now on HFNC 65%.  Still desats laying flat or turning on his right side     OSA  Pt with known sleep apnea on sleep study. Does not use cpap.    Cardiovascular   Tachycardia  Hypotension s/p intubationLD: resolved   - normotensive on arrival, tachy improved w fluid and analgesia  - EKG no evid acs and trop flat  - addtl bolus given on arrival to MICU and peri intubation  - levo started for MAP goals >65: OFF   - formal echo: normal   -  cont home regimen asa    Renal   AKI on CKD  Metabolic acidosis  Baseline Cr around 1.8. AKI to 2.4 most likely pre-renal in setting of NV from pneumonia and illness.  -- trend I/O w foley   -- downtrend Cr since admit w volume repletion  - stopped oral bicarb     Infectious Disease/Autoimmune   Pneumonia w hx MRSA bacteremia, cand fungemia  48 hours of chills, sob and productive cough with cxr cf rll pneumonia and leukocytosis. Most likely bacterial infection as pt on ppx  With cresemba, bactrim and valtrex. Initial lactate 2.2 down to 1.2 with IVF resusitation. Given vanc in ED and will stop vancomycin given hx of hearing loss.    - febrile, leukocytosis   - transferred to [facility] on linezolid, mero, mica >> changed mero to dapto and cefepime given past tolerance to cefepime  - cont abd regimen per ICID  - scheduled apap for fevers  - f/u cx data >> one of two blodd cx positive for GPC, known prior mrsa  - cont contact precautions for MRSA  - holding BAL for now given tenuous resp status and reasonable trach aspirate   - Will require port removal: discussed with IR and they are able to come to MICU to remove if needed.  Discuss with ID   - repeat cx q 48 hrs  -Urine histo Ag pending  -Urine legionella Ag neg   -Serum crypto Ag neg     For MRSA bacteremia + suspected MRSA PNA  ?? Daptomycin  ?? Linezolid 600mg  q12h,    PPX:  -- Continue Home crescemba  -- Continue home bactrim, ss MWF  -- continue home valtrex 500mg  daily    Cultures:  Blood Culture, Routine (no units)   Date Value   11/01/2020 No Growth at 48 hours     Urine Culture, Comprehensive (no units)   Date Value   11/01/2020 NO GROWTH     Lower Respiratory Culture (no units)   Date Value   11/01/2020 3+ Staphylococcus aureus (A)     WBC (10*9/L)   Date Value   11/03/2020 13.1 (H)         History of Dress  Hx of dress on ceftaroline was on prednisone taper in may 2022, but may still have been on 15 mg/day.  Verifying with wife.    -- monitor for dress while undergoing tx with abx  - derm consult   - pred 20 mg restarted 8/1.    - stopped cefepime     FEN/GI   #NAI  - senna and miralax for bowel regimen  - reg diet        Malnutrition Assessment: Not done yet.  Body mass index is 32.08 kg/m??.               Heme/Coag   Ph+ B-ALL  Followed by [facility] [name] on  C3D27 ponatinib-binatumomab running infusion upon transfer to MICU.   -- Consult Hem/Onc regarding chemotherapy/immunotherapy >> holding infusion for now, stopped 7/31  -- cont asa 81mg  daily while on ponatinib per onc recs    Hypogammaglobulinemia  Recieves IVIG q 6 weeks. Will check Ig levels as may benefit from IVIG in setting of infection.  --IgM, IgA, IgG    Endocrine   NAI  - accu checks q6    Integumentary #NAI  - WOCN consulted for high risk skin assessment Yes.  - WOCN recs >> pending   - cont  pressure mitigating precautions per skin policy    Prophylaxis/LDA/Restraints/Consults   Can CVC be removed? yes  Can A-line be removed? N/A, no A-line present  Can Foley be removed? N/A, no Foley present  Mobility plan: Step 4 - Out of bed assessment (sit at bedside)    Feeding: reg diet   Analgesia: Pain not adequately controlled, titrating medications  Sedation SAT/SBT: yes  Thromboembolic ppx: SQ heparin  Head of bed >30 degrees: Yes  Ulcer ppx: Not indicated  Glucose within target range: Yes, in range    Does patient need/have an active type/screen? Yes    RASS at goal? No - adjusting sedation or order to reflect need  [place] Agitation Assessment Scale (RASS) : awakens to voice (eye opening/contact) >10 sec (11/03/2020  6:00 AM)     Can antipsychotics be stopped? N/A, not on antipsychotics  CAM-ICU Result: CAM-ICU Positive (11/02/2020  8:00 AM)      Would hospice care be appropriate for this patient? No, patient improving or expected to improve    Patient Lines/Drains/Airways Status     Active Active Lines, Drains, & Airways     Name Placement date Placement time Site Days    Power Port--a-Cath Single Hub 03/10/20 Right Internal jugular 03/10/20  0935  Internal jugular  238    NG/OG Tube 16 Fr. Left nostril 11/01/20  1400  Left nostril  1    Urethral Catheter 11/01/20  1600  --  1    Peripheral IV 11/01/20 Left Forearm 11/01/20  0330  Forearm  2    Peripheral IV 11/01/20 Anterior;Proximal;Right Forearm 11/01/20  1200  Forearm  2    Arterial Line 11/01/20 Left Radial 11/01/20  1200  Radial  2              Patient Lines/Drains/Airways Status     Active Wounds     None                Goals of Care     Code Status: Full Code    Designated Healthcare Decision Maker:  Mr. Yohn current decisional capacity for healthcare decision-making is Full capacity. His designated Educational psychologist) is/are   HCDM (patient stated preference): Argelio, Granier - Spouse - [phone].      Subjective   C/o back pain and R lower chest pain           Allergies  Allergies   Allergen Reactions   ??? Bupropion Hcl Other (See Comments)     Per patient out of touch with reality, suicidal, homicidal   ??? Ceftaroline Fosamil Rash and Other (See Comments)     Rash X 2 02/2020, suspected DRESS 06/2020   ??? 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 Onion Anaphylaxis   ??? Vancomycin Analogues      Hearing loss with Lasix  Other reaction(s): Other (See Comments)  Hearing loss  lasix   ??? Bismuth Subsalicylate Nausea And Vomiting   ??? Furosemide      With Vancomycin caused hearing loss  Other reaction(s): Other (See Comments)  With Vancomycin caused hearing loss       Meds  No current facility-administered medications on file prior to encounter.     Current Outpatient Medications on File Prior to Encounter   Medication Sig   ??? acetaminophen (TYLENOL) 325 MG tablet Take 650 mg by mouth every six (6) hours as needed for pain.    ??? amLODIPine (NORVASC)  10 MG tablet Take 1 tablet (10 mg total) by mouth daily.   ??? aspirin 81 MG chewable tablet Chew 1 tablet (81 mg total) daily.   ??? carvediloL (COREG) 12.5 MG tablet TAKE 1 TABLET BY MOUTH TWO TIMES A DAY.   ??? clonazePAM (KLONOPIN) 0.5 MG tablet Take 1 tablet (0.5 mg total) by mouth two (2) times a day as needed for anxiety or sleep.   ??? gabapentin (NEURONTIN) 300 MG capsule Take 1 capsule (300 mg total) by mouth Three (3) times a day.   ??? hydrOXYzine (ATARAX) 25 MG tablet Take 1 tablet (25 mg total) by mouth every six (6) hours as needed.   ??? isavuconazonium sulfate (CRESEMBA) 186 mg cap capsule Take 2 capsules (372 mg total) by mouth daily.   ??? oxyCODONE (OXYCONTIN) 15 mg 12 hr crush resistant ER/CR tablet Take 1 tablet (15 mg total) by mouth two (2) times a day for 28 days.   ??? OXYCONTIN 15 mg 12 hr crush resistant ER/CR tablet TAKE 1 TABLET (15 MG TOTAL) BY MOUTH TWO (2) TIMES A DAY FOR 28 DAYS.   ??? PONATinib (ICLUSIG) 30 mg tablet Take 1 tablet (30 mg total) by mouth daily. Swallow tablets whole. Do not crush, break, cut or chew tablets.   ??? prochlorperazine (COMPAZINE) 10 MG tablet Take 1 tablet (10 mg total) by mouth every six (6) hours as needed for nausea (if no relief from zofran (ondansetron)).   ??? sulfamethoxazole-trimethoprim (BACTRIM DS) 800-160 mg per tablet Take 1 tablet (160 mg of trimethoprim total) by mouth 2 times a day on Saturday, Sunday. For prophylaxis while on chemo.   ??? valACYclovir (VALTREX) 500 MG tablet Take 1 tablet (500 mg total) by mouth daily.   ??? nortriptyline (PAMELOR) 75 MG capsule Take 1 capsule (75 mg total) by mouth nightly.       Past Medical History  Past Medical History:   Diagnosis Date   ??? 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 BONE MARROW BIOPSY & ASPIRATION  01/21/2020        ??? IR INSERT PORT AGE GREATER THAN 5 YRS  03/10/2020    IR INSERT PORT AGE GREATER THAN 5 YRS 03/10/2020 [name], MD IMG VIR H&V [facility]   ??? PR BRONCHOSCOPY,DIAGNOSTIC W LAVAGE Bilateral 07/20/2020    Procedure: BRONCHOSCOPY, RIGID OR FLEXIBLE, INCLUDE FLUOROSCOPIC GUIDANCE WHEN PERFORMED; W/BRONCHIAL ALVEOLAR LAVAGE WITH MODERATE SEDATION;  Surgeon: Dellis Filbert, MD;  Location: BRONCH PROCEDURE LAB [facility];  Service: Pulmonary         Objective     Vitals - past 24 hours  Heart Rate:  [81-104] 83  SpO2 Pulse:  [81-103] 94  Resp:  [13-26] 15  BP: (137)/(81) 137/81  FiO2 (%):  [40 %-80 %] 60 %  SpO2:  [90 %-96 %] 95 % Intake/Output  I/O last 3 completed shifts:  In: 3201.5 [I.V.:626.1; NG/GT:1385; IV Piggyback:1190.4]  Out: 1735 [Urine:1735]     Physical Exam:    General:  Awake and alert   HEENT: MMM,   CV: Soft S1,S2, no murmurs, no BLE edema  Pulm: Diminished throughout and bases w R>L, apices few scattered rhonchi, no wheezing  GI:  NTND, BS+ no rebound or guarding x near Rt CPA and chest wall  MSK: normal muslce tone  Skin: no rashes/esions, clammy   Neuro: FC, mAE  Continuous Infusions:           Scheduled Medications:   ??? acetaminophen  1,000 mg Enteral tube: gastric  Q8H   ??? aspirin  81 mg Enteral tube: gastric  Daily   ??? chlorhexidine  5 mL Topical BID   ??? DAPTOmycin  10 mg/kg Intravenous Q24H   ??? famotidine  20 mg Enteral tube: gastric  Daily   ??? gabapentin  300 mg Enteral tube: gastric  TID   ??? heparin (porcine) for subcutaneous use  5,000 Units Subcutaneous Q8H [facility]   ??? ipratropium-albuteroL  3 mL Nebulization Q6H (RT)   ??? isavuconazonium sulfate  372 mg Oral Daily   ??? lidocaine  1 patch Transdermal Daily   ??? linezolid  600 mg Intravenous Q12H [facility]   ??? melatonin  3 mg Enteral tube: gastric  QPM   ??? nortriptyline  75 mg Enteral tube: gastric  Nightly   ??? oxyCODONE  20 mg Enteral tube: gastric  Q6H   ??? polyethylene glycol  17 g Enteral tube: gastric  BID   ??? predniSONE  20 mg Enteral tube: gastric  Daily   ??? senna  2 tablet Enteral tube: gastric  Nightly   ??? sodium bicarbonate  650 mg Enteral tube: gastric  QID   ??? sulfamethoxazole-trimethoprim  1 tablet Enteral tube: gastric  Once per day on Mon Wed Fri   ??? valACYclovir  500 mg Enteral tube: gastric  Daily       PRN medications:  oxyCODONE    Data/Imaging Review: Reviewed in Epic and personally interpreted on 11/03/2020. See EMR for detailed results.      Critical Care Attestation     This patient is critically ill or injured with the impairment of vital organ systems such that there is a high probability of imminent or life threatening deterioration in the patient's condition. This patient must remain in the ICU for ongoing evaluation of the comprehensive management plan outlined in this note. I directly provided critical care services as documented in this note and the critical care time spent (50  min) is exclusive of separately billable procedures.  In addition to time spent for critical care management, I also provided advance care planning services for 0  minutes (see ACP note for details)???. Total billable critical care time 50 minutes.    Verlean Allport Paulino Door, PA

## 2020-11-04 LAB — COMPREHENSIVE METABOLIC PANEL
ALBUMIN: 2.4 g/dL — ABNORMAL LOW (ref 3.4–5.0)
ALKALINE PHOSPHATASE: 106 U/L (ref 46–116)
ALT (SGPT): 24 U/L (ref 10–49)
ANION GAP: 5 mmol/L (ref 5–14)
AST (SGOT): 19 U/L (ref <=34)
BILIRUBIN TOTAL: 0.2 mg/dL — ABNORMAL LOW (ref 0.3–1.2)
BLOOD UREA NITROGEN: 28 mg/dL — ABNORMAL HIGH (ref 9–23)
BUN / CREAT RATIO: 17
CALCIUM: 8.8 mg/dL (ref 8.7–10.4)
CHLORIDE: 103 mmol/L (ref 98–107)
CO2: 27 mmol/L (ref 20.0–31.0)
CREATININE: 1.66 mg/dL — ABNORMAL HIGH
EGFR CKD-EPI (2021) MALE: 53 mL/min/{1.73_m2} — ABNORMAL LOW (ref >=60)
GLUCOSE RANDOM: 118 mg/dL (ref 70–179)
POTASSIUM: 3.7 mmol/L (ref 3.4–4.8)
PROTEIN TOTAL: 6 g/dL (ref 5.7–8.2)
SODIUM: 135 mmol/L (ref 135–145)

## 2020-11-04 LAB — CBC W/ AUTO DIFF
BASOPHILS ABSOLUTE COUNT: 0.1 10*9/L (ref 0.0–0.1)
BASOPHILS RELATIVE PERCENT: 0.6 %
EOSINOPHILS ABSOLUTE COUNT: 0.6 10*9/L — ABNORMAL HIGH (ref 0.0–0.5)
EOSINOPHILS RELATIVE PERCENT: 4.7 %
HEMATOCRIT: 29 % — ABNORMAL LOW (ref 39.0–48.0)
HEMOGLOBIN: 10.1 g/dL — ABNORMAL LOW (ref 12.9–16.5)
LYMPHOCYTES ABSOLUTE COUNT: 0.5 10*9/L — ABNORMAL LOW (ref 1.1–3.6)
LYMPHOCYTES RELATIVE PERCENT: 4.1 %
MEAN CORPUSCULAR HEMOGLOBIN CONC: 34.8 g/dL (ref 32.0–36.0)
MEAN CORPUSCULAR HEMOGLOBIN: 32.6 pg — ABNORMAL HIGH (ref 25.9–32.4)
MEAN CORPUSCULAR VOLUME: 93.8 fL (ref 77.6–95.7)
MEAN PLATELET VOLUME: 8.6 fL (ref 6.8–10.7)
MONOCYTES ABSOLUTE COUNT: 0.5 10*9/L (ref 0.3–0.8)
MONOCYTES RELATIVE PERCENT: 4.3 %
NEUTROPHILS ABSOLUTE COUNT: 10.5 10*9/L — ABNORMAL HIGH (ref 1.8–7.8)
NEUTROPHILS RELATIVE PERCENT: 86.3 %
PLATELET COUNT: 191 10*9/L (ref 150–450)
RED BLOOD CELL COUNT: 3.09 10*12/L — ABNORMAL LOW (ref 4.26–5.60)
RED CELL DISTRIBUTION WIDTH: 16.3 % — ABNORMAL HIGH (ref 12.2–15.2)
WBC ADJUSTED: 12.1 10*9/L — ABNORMAL HIGH (ref 3.6–11.2)

## 2020-11-04 LAB — MAGNESIUM: MAGNESIUM: 2 mg/dL (ref 1.6–2.6)

## 2020-11-04 LAB — PHOSPHORUS: PHOSPHORUS: 2.7 mg/dL (ref 2.4–5.1)

## 2020-11-04 LAB — PROTIME-INR
INR: 0.92
PROTIME: 10.8 s (ref 10.3–13.4)

## 2020-11-04 MED ADMIN — heparin (porcine) 5,000 unit/mL injection 5,000 Units: 5000 [IU] | SUBCUTANEOUS | @ 10:00:00

## 2020-11-04 MED ADMIN — oxyCODONE (ROXICODONE) immediate release tablet 20 mg: 20 mg | ORAL | @ 13:00:00 | Stop: 2020-11-17

## 2020-11-04 MED ADMIN — sodium bicarbonate tablet 650 mg: 650 mg | GASTROENTERAL | @ 10:00:00

## 2020-11-04 MED ADMIN — lidocaine (XYLOCAINE) 10 mg/mL (1 %) injection: SUBCUTANEOUS | @ 15:00:00 | Stop: 2020-11-04

## 2020-11-04 MED ADMIN — sodium bicarbonate tablet 650 mg: 650 mg | GASTROENTERAL

## 2020-11-04 MED ADMIN — oxyCODONE (ROXICODONE) immediate release tablet 20 mg: 20 mg | GASTROENTERAL | @ 04:00:00 | Stop: 2020-11-17

## 2020-11-04 MED ADMIN — oxyCODONE (ROXICODONE) immediate release tablet 20 mg: 20 mg | GASTROENTERAL | @ 10:00:00 | Stop: 2020-11-17

## 2020-11-04 MED ADMIN — acetaminophen (TYLENOL) tablet 1,000 mg: 1000 mg | GASTROENTERAL | @ 13:00:00 | Stop: 2020-12-01

## 2020-11-04 MED ADMIN — sulfamethoxazole-trimethoprim (BACTRIM DS) 800-160 mg tablet 160 mg of trimethoprim: 1 | GASTROENTERAL | @ 13:00:00 | Stop: 2020-12-02

## 2020-11-04 MED ADMIN — midazolam (VERSED) injection: INTRAVENOUS | @ 14:00:00 | Stop: 2020-11-04

## 2020-11-04 MED ADMIN — oxyCODONE (ROXICODONE) immediate release tablet 20 mg: 20 mg | GASTROENTERAL | @ 21:00:00 | Stop: 2020-11-17

## 2020-11-04 MED ADMIN — aspirin chewable tablet 81 mg: 81 mg | GASTROENTERAL | @ 13:00:00

## 2020-11-04 MED ADMIN — acetaminophen (TYLENOL) tablet 1,000 mg: 1000 mg | GASTROENTERAL | @ 20:00:00 | Stop: 2020-12-01

## 2020-11-04 MED ADMIN — midazolam (VERSED) injection: INTRAVENOUS | @ 15:00:00 | Stop: 2020-11-04

## 2020-11-04 MED ADMIN — gabapentin (NEURONTIN) capsule 300 mg: 300 mg | GASTROENTERAL | @ 19:00:00

## 2020-11-04 MED ADMIN — sodium bicarbonate tablet 650 mg: 650 mg | GASTROENTERAL | @ 21:00:00

## 2020-11-04 MED ADMIN — linezolid in dextrose 5% (ZYVOX) 600 mg/300 mL IVPB 600 mg: 600 mg | INTRAVENOUS | Stop: 2020-11-11

## 2020-11-04 MED ADMIN — DAPTOmycin (CUBICIN) 1,150 mg in sodium chloride (NS) 0.9 % 50 mL IVPB: 10 mg/kg | INTRAVENOUS | @ 21:00:00 | Stop: 2020-11-15

## 2020-11-04 MED ADMIN — LORazepam (ATIVAN) tablet 0.5 mg: .5 mg | ORAL | @ 02:00:00 | Stop: 2020-11-03

## 2020-11-04 MED ADMIN — predniSONE (DELTASONE) tablet 20 mg: 20 mg | GASTROENTERAL | @ 13:00:00

## 2020-11-04 MED ADMIN — acetaminophen (TYLENOL) tablet 1,000 mg: 1000 mg | GASTROENTERAL | @ 04:00:00 | Stop: 2020-12-01

## 2020-11-04 MED ADMIN — ipratropium-albuteroL (DUO-NEB) 0.5-2.5 mg/3 mL nebulizer solution 3 mL: 3 mL | RESPIRATORY_TRACT | @ 20:00:00

## 2020-11-04 MED ADMIN — oxyCODONE (ROXICODONE) immediate release tablet 20 mg: 20 mg | GASTROENTERAL | @ 16:00:00 | Stop: 2020-11-17

## 2020-11-04 MED ADMIN — senna (SENOKOT) tablet 2 tablet: 2 | GASTROENTERAL

## 2020-11-04 MED ADMIN — valACYclovir (VALTREX) tablet 500 mg: 500 mg | GASTROENTERAL | @ 13:00:00

## 2020-11-04 MED ADMIN — gabapentin (NEURONTIN) capsule 300 mg: 300 mg | GASTROENTERAL

## 2020-11-04 MED ADMIN — ipratropium-albuteroL (DUO-NEB) 0.5-2.5 mg/3 mL nebulizer solution 3 mL: 3 mL | RESPIRATORY_TRACT | @ 01:00:00

## 2020-11-04 MED ADMIN — ketorolac (TORADOL) injection 15 mg: 15 mg | INTRAVENOUS | @ 16:00:00 | Stop: 2020-11-04

## 2020-11-04 MED ADMIN — melatonin tablet 3 mg: 3 mg | GASTROENTERAL | @ 21:00:00

## 2020-11-04 MED ADMIN — heparin (porcine) 5,000 unit/mL injection 5,000 Units: 5000 [IU] | SUBCUTANEOUS | @ 03:00:00

## 2020-11-04 MED ADMIN — oxyCODONE (ROXICODONE) immediate release tablet 20 mg: 20 mg | ORAL | Stop: 2020-11-17

## 2020-11-04 MED ADMIN — heparin (porcine) 5,000 unit/mL injection 5,000 Units: 5000 [IU] | SUBCUTANEOUS | @ 20:00:00

## 2020-11-04 MED ADMIN — PONATinib (ICLUSIG) tablet 30 mg **PATIENT SUPPLIED**: 30 mg | ORAL | @ 21:00:00

## 2020-11-04 MED ADMIN — sodium bicarbonate tablet 650 mg: 650 mg | GASTROENTERAL | @ 16:00:00

## 2020-11-04 MED ADMIN — isavuconazonium sulfate (CRESEMBA) capsule 372 mg: 372 mg | ORAL | @ 13:00:00

## 2020-11-04 MED ADMIN — oxyCODONE (ROXICODONE) immediate release tablet 20 mg: 20 mg | ORAL | @ 06:00:00 | Stop: 2020-11-17

## 2020-11-04 MED ADMIN — gabapentin (NEURONTIN) capsule 300 mg: 300 mg | GASTROENTERAL | @ 13:00:00

## 2020-11-04 MED ADMIN — nortriptyline (PAMELOR) capsule 75 mg: 75 mg | GASTROENTERAL

## 2020-11-04 MED ADMIN — linezolid in dextrose 5% (ZYVOX) 600 mg/300 mL IVPB 600 mg: 600 mg | INTRAVENOUS | @ 13:00:00 | Stop: 2020-11-11

## 2020-11-04 MED ADMIN — carvediloL (COREG) tablet 12.5 mg: 12.5 mg | ORAL | @ 13:00:00

## 2020-11-04 NOTE — Unmapped (Signed)
MICU Daily Progress Note     Date of Service: 11/04/2020    Problem List:   Principal Problem:    Pneumonia  Active Problems:    Tobacco use disorder    AKI (acute kidney injury) (CMS-HCC)    MRSA bacteremia    Recurrent major depressive disorder, in partial remission (CMS-HCC)    Immunocompromised (CMS-HCC)    Hypogammaglobulinemia (CMS-HCC)    ALL (acute lymphoblastic leukemia) (CMS-HCC)    Respiratory failure with hypoxia (CMS-HCC)    Chronic pain    Acute kidney injury superimposed on CKD (CMS-HCC)    COPD (chronic obstructive pulmonary disease) (CMS-HCC)  Resolved Problems:    * No resolved hospital problems. *      HPI: Adam [name] with PH+ B-ALL, COPD, hospitalization 07/2020 w/ MRSA bacteremia, Candida Krusei fungemia, active smoking, sp Covid infection 09/2020 followed by [facility] [place] [facility] who presented to [facility] with worsening SOB x48 hours.    History provided by patient and wife. He was in his normal state of health until 10/29/20 when he started developing worsening SOB, productive cough and chills. Coughing spells were intermittently associated with he denies sick contacts, fever, diarrhea, dysuria, polyuria, aspiration or focalizing pain. Pt endorses taking all his medicines as prescribed including antifungal and ppx meds.     Of not pt hospitalized 08/2020 for induction therapy that was complicated by Septic Shock due to MRSA and Candida krusei fungemia, acute renal failure necessitating temporary iHD and AHRB requiring intubation and stay in MICU    Pt endorses being full code status and his decision maker is his wife as listed in epic.     Interval Events:  - extubated to CPAP    Neurological   Acute on Chronic Pain, Anxiety Followed by Palliative care  -- holding home oxycontin 15mg  bid, dilaudid 2mg  po q4 prn mod sev pain and klonipin 0.5mg  bid prn anxiety  - cont home gabapentin   - wean of fentanyl gtt - increase scheduled oxy to 20 q 6 hr and add oral PRN's. Depression  -- Continue home nortriptyline    Pulmonary   AHRF 2/2 Pneumonia with History of Intubation, tobacco abuse   Arrived in ed needing bipap placement 70%. Duonebs given 2/2 history of copd and wheezing RLL in area of consolidation. Pt down to 65% with transition to Bipap 10/5.  - amidst scheduled nebs, airway clearance and narcotic for splinting, pt progressed to needing intubation d/t inc WOB and worsening hypoxia  - RSI per anesthesia w difficulty oxygenating afterwards, likely r/t de-recruitment and shunt physiology  - cont duoneb scheduled q6  - weaned to 80% + 12 this am.  No desats with awakening today, but does with turn.  Cont to wean for PaO2 > 55%.  DP stable on PEEP of 12   - 8/2: stable on PS this am and passed SBT.  Extubated to cpap and now on HFNC 65%.  Still desats laying flat or turning on his right side   - 8/3: CPAP 40% but HFNC 80%.  Pain with inspiration, will try dose of toradol. Otherwise comfortably breathing. ultrasound of R chest does not show large effusion or concern for empyema.     OSA  Pt with known sleep apnea on sleep study. Does not use cpap.    Cardiovascular   Tachycardia  Hypotension s/p intubationLD: resolved   - normotensive on arrival, tachy improved w fluid and analgesia  - EKG no evid acs and  trop flat  - addtl bolus given on arrival to MICU and peri intubation  - levo started for MAP goals >65: OFF   - formal echo: normal   - cont home regimen asa    Renal   AKI on CKD: resolved   Metabolic acidosis: resolved   Baseline Cr around 1.8. AKI to 2.4 most likely pre-renal in setting of NV from pneumonia and illness.  -- trend I/O w foley   -- downtrend Cr since admit w volume repletion    Infectious Disease/Autoimmune   Pneumonia w hx MRSA bacteremia, cand fungemia  48 hours of chills, sob and productive cough with cxr cf rll pneumonia and leukocytosis. Most likely bacterial infection as pt on ppx  With cresemba, bactrim and valtrex. Initial lactate 2.2 down to 1.2 with IVF resusitation. Given vanc in ED and will stop vancomycin given hx of hearing loss.    - febrile, leukocytosis   - transferred to [facility] on linezolid, mero, mica >> changed mero to dapto and cefepime given past tolerance to cefepime  - cont abd regimen per ICID  - scheduled apap for fevers  - f/u cx data >> one of two blodd cx positive for GPC, known prior mrsa  - cont contact precautions for MRSA  - holding BAL for now given tenuous resp status and reasonable trach aspirate   - Will require port removal: discussed with IR and they are able to come to MICU to remove if needed.  Discuss with ID   - repeat cx q 48 hrs  -Urine histo Ag pending  -Urine legionella Ag neg   -Serum crypto Ag neg     For MRSA bacteremia + suspected MRSA PNA  ?? Daptomycin  ?? Linezolid 600mg  q12h,  ?? Port REMOVED 8/3     PPX:  -- Continue Home crescemba  -- Continue home bactrim, ss MWF  -- continue home valtrex 500mg  daily    Cultures:  Blood Culture, Routine (no units)   Date Value   11/03/2020 No Growth at 24 hours     Urine Culture, Comprehensive (no units)   Date Value   11/01/2020 NO GROWTH     Lower Respiratory Culture (no units)   Date Value   11/01/2020 3+ Methicillin resistant Staphylococcus aureus (A)     WBC (10*9/L)   Date Value   11/04/2020 12.1 (H)         History of Dress  Hx of dress on ceftaroline was on prednisone taper in may 2022, but may still have been on 15 mg/day.  Verifying with wife.    -- monitor for dress while undergoing tx with abx  - derm consult   - pred 20 mg restarted 8/1.    - stopped cefepime     FEN/GI   #NAI  - senna and miralax for bowel regimen  - reg diet        Malnutrition Assessment: Not done yet.  Body mass index is 32.08 kg/m??.               Heme/Coag   Ph+ B-ALL  Followed by [facility] [name] on  C3D27 ponatinib-binatumomab running infusion upon transfer to MICU.   -- Consult Hem/Onc regarding chemotherapy/immunotherapy >> holding infusion for now, stopped 7/31  -- cont asa 81mg  daily while on ponatinib per onc recs    Hypogammaglobulinemia  Recieves IVIG q 6 weeks. Will check Ig levels as may benefit from IVIG in setting of infection.  --IgM, IgA,  IgG    Endocrine   NAI  - accu checks q6    Integumentary   #NAI  - WOCN consulted for high risk skin assessment Yes.  - WOCN recs >> pending   - cont pressure mitigating precautions per skin policy    Prophylaxis/LDA/Restraints/Consults   Can CVC be removed? yes  Can A-line be removed? N/A, no A-line present  Can Foley be removed? N/A, no Foley present  Mobility plan: Step 4 - Out of bed assessment (sit at bedside)    Feeding: reg diet   Analgesia: Pain not adequately controlled, titrating medications  Sedation SAT/SBT: yes  Thromboembolic ppx: SQ heparin  Head of bed >30 degrees: Yes  Ulcer ppx: Not indicated  Glucose within target range: Yes, in range    Does patient need/have an active type/screen? Yes    RASS at goal? No - adjusting sedation or order to reflect need  [place] Agitation Assessment Scale (RASS) : Alert and calm (11/04/2020 11:25 AM)     Can antipsychotics be stopped? N/A, not on antipsychotics  CAM-ICU Result: CAM-ICU Negative (11/03/2020  8:00 PM)      Would hospice care be appropriate for this patient? No, patient improving or expected to improve    Patient Lines/Drains/Airways Status     Active Active Lines, Drains, & Airways     Name Placement date Placement time Site Days    Urethral Catheter 11/01/20  1600  --  2    Peripheral IV 11/01/20 Left Forearm 11/01/20  0330  Forearm  3    Peripheral IV 11/01/20 Anterior;Proximal;Right Forearm 11/01/20  1200  Forearm  2    Arterial Line 11/01/20 Left Radial 11/01/20  1200  Radial  2              Patient Lines/Drains/Airways Status     Active Wounds     None                Goals of Care     Code Status: Full Code    Designated Healthcare Decision Maker:  Mr. Holzhauer current decisional capacity for healthcare decision-making is Full capacity. His designated Educational psychologist) is/are   HCDM (patient stated preference): Hilario, [place] - Spouse - [phone].      Subjective   C/o severe pain with deep breathing          Allergies  Allergies   Allergen Reactions   ??? Bupropion Hcl Other (See Comments)     Per patient out of touch with reality, suicidal, homicidal   ??? Ceftaroline Fosamil Rash and Other (See Comments)     Rash X 2 02/2020, suspected DRESS 06/2020   ??? 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 Onion Anaphylaxis   ??? Vancomycin Analogues      Hearing loss with Lasix  Other reaction(s): Other (See Comments)  Hearing loss  lasix   ??? Bismuth Subsalicylate Nausea And Vomiting   ??? Furosemide      With Vancomycin caused hearing loss  Other reaction(s): Other (See Comments)  With Vancomycin caused hearing loss       Meds  No current facility-administered medications on file prior to encounter.     Current Outpatient Medications on File Prior to Encounter   Medication Sig   ??? acetaminophen (TYLENOL) 325 MG tablet Take 650 mg by mouth every six (6) hours as needed for pain.    ??? amLODIPine (NORVASC) 10 MG tablet Take  1 tablet (10 mg total) by mouth daily.   ??? aspirin 81 MG chewable tablet Chew 1 tablet (81 mg total) daily.   ??? carvediloL (COREG) 12.5 MG tablet TAKE 1 TABLET BY MOUTH TWO TIMES A DAY.   ??? clonazePAM (KLONOPIN) 0.5 MG tablet Take 1 tablet (0.5 mg total) by mouth two (2) times a day as needed for anxiety or sleep.   ??? gabapentin (NEURONTIN) 300 MG capsule Take 1 capsule (300 mg total) by mouth Three (3) times a day.   ??? hydrOXYzine (ATARAX) 25 MG tablet Take 1 tablet (25 mg total) by mouth every six (6) hours as needed.   ??? isavuconazonium sulfate (CRESEMBA) 186 mg cap capsule Take 2 capsules (372 mg total) by mouth daily.   ??? OXYCONTIN 15 mg 12 hr crush resistant ER/CR tablet TAKE 1 TABLET (15 MG TOTAL) BY MOUTH TWO (2) TIMES A DAY FOR 28 DAYS.   ??? PONATinib (ICLUSIG) 30 mg tablet Take 1 tablet (30 mg total) by mouth daily. Swallow tablets whole. Do not crush, break, cut or chew tablets.   ??? sulfamethoxazole-trimethoprim (BACTRIM DS) 800-160 mg per tablet Take 1 tablet (160 mg of trimethoprim total) by mouth 2 times a day on Saturday, Sunday. For prophylaxis while on chemo.   ??? valACYclovir (VALTREX) 500 MG tablet Take 1 tablet (500 mg total) by mouth daily.   ??? nortriptyline (PAMELOR) 75 MG capsule Take 1 capsule (75 mg total) by mouth nightly.       Past Medical History  Past Medical History:   Diagnosis Date   ??? 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 BONE MARROW BIOPSY & ASPIRATION  01/21/2020        ??? IR INSERT PORT AGE GREATER THAN 5 YRS  03/10/2020    IR INSERT PORT AGE GREATER THAN 5 YRS 03/10/2020 [name], MD IMG VIR [facility]   ??? PR BRONCHOSCOPY,DIAGNOSTIC W LAVAGE Bilateral 07/20/2020    Procedure: BRONCHOSCOPY, RIGID OR FLEXIBLE, INCLUDE FLUOROSCOPIC GUIDANCE WHEN PERFORMED; W/BRONCHIAL ALVEOLAR LAVAGE WITH MODERATE SEDATION;  Surgeon: Dellis Filbert, MD;  [facility]ON[facility] PROCEDURE LAB [facility];  Service: Pulmonary         Objective     Vitals - past 24 hours  Temp:  [36.7 ??C (98 ??F)-36.8 ??C (98.3 ??F)] 36.7 ??C (98.1 ??F)  Heart Rate:  [73-94] 86  SpO2 Pulse:  [73-94] 94  Resp:  [9-32] 19  BP: (167-171)/(88-105) 171/105  FiO2 (%):  [40 %-80 %] 50 %  SpO2:  [89 %-98 %] 95 % Intake/Output  I/O last 3 completed shifts:  In: 1559.5 [P.O.:100; I.V.:69.5; NG/GT:440; IV Piggyback:950]  Out: 3600 [Urine:3600]     Physical Exam:    General:  Awake and alert   HEENT: MMM,   CV: Soft S1,S2, no murmurs, no BLE edema  Pulm: Diminished throughout and bases w R>L, apices few scattered rhonchi, no wheezing  GI:  NTND, BS+ no rebound or guarding x near Rt CPA and chest wall  MSK: normal muslce tone  Skin: no rashes/esions, clammy   Neuro: FC, mAE     Continuous Infusions:   ??? IP okay to treat         ??? IP okay to treat         Scheduled Medications:   ??? acetaminophen  1,000 mg Enteral tube: gastric  Q8H   ???  aspirin  81 mg Enteral tube: gastric  Daily   ??? carvediloL  12.5 mg Oral BID   ??? DAPTOmycin  10 mg/kg Intravenous Q24H   ??? gabapentin  300 mg Enteral tube: gastric  TID   ??? heparin (porcine) for subcutaneous use  5,000 Units Subcutaneous Q8H [facility]   ??? ipratropium-albuteroL  3 mL Nebulization Q6H (RT)   ??? isavuconazonium sulfate  372 mg Oral Daily   ??? ketorolac  15 mg Intravenous Once   ??? lidocaine  1 patch Transdermal Daily   ??? linezolid  600 mg Intravenous Q12H [facility]   ??? melatonin  3 mg Enteral tube: gastric  QPM   ??? nortriptyline  75 mg Enteral tube: gastric  Nightly   ??? oxyCODONE  20 mg Enteral tube: gastric  Q6H [facility]   ??? polyethylene glycol  17 g Enteral tube: gastric  BID   ??? predniSONE  20 mg Enteral tube: gastric  Daily   ??? senna  2 tablet Enteral tube: gastric  Nightly   ??? sodium bicarbonate  650 mg Enteral tube: gastric  QID   ??? sulfamethoxazole-trimethoprim  1 tablet Enteral tube: gastric  Once per day on Mon Wed Fri   ??? valACYclovir  500 mg Enteral tube: gastric  Daily       PRN medications:  IP okay to treat, oxyCODONE    Data/Imaging Review: Reviewed in Epic and personally interpreted on 11/04/2020. See EMR for detailed results.      Critical Care Attestation     This patient is critically ill or injured with the impairment of vital organ systems such that there is a high probability of imminent or life threatening deterioration in the patient's condition. This patient must remain in the ICU for ongoing evaluation of the comprehensive management plan outlined in this note. I directly provided critical care services as documented in this note and the critical care time spent (50  min) is exclusive of separately billable procedures.  In addition to time spent for critical care management, I also provided advance care planning services for 0  minutes (see ACP note for details)???. Total billable critical care time 50 minutes.    Arnecia Ector Paulino Door, PA

## 2020-11-04 NOTE — Unmapped (Signed)
[place] INTERVENTIONAL RADIOLOGY - Operative Note     VIR Post-Procedure Note    Procedure Name: removal of right internal jugular port    Pre-Op Diagnosis: MRSA bacteremia    Post-Op Diagnosis: Same as pre-operative diagnosis    VIR Providers  Attending: Benjamine Mola, MD  Resident: Everrett Coombe, MD    Description of procedure: Successful removal of right internal jugular Port-a-Cath.    Estimated Blood Loss: approximately 2 mL  Complications: None    See detailed procedure note with images in PACS [facility]).    The patient tolerated the procedure well without incident or complication and left the room in stable condition.    Everrett Coombe, MD  11/04/2020 11:24 AM

## 2020-11-04 NOTE — Unmapped (Signed)
Adam Keith was alert and oriented, following commands, and able to help with turns and repositions - NSR with intermittent HTN w/systolic to 170, afebrile - Alternating between CPAP at 50% for procedure, and HFNC 50-65% with episodes of desat with talking, eating, or exertion - Port removed in VIR this morning - Adequate urine output via foley -  Family at bedside and updated.     Problem: Skin Injury Risk Increased  Goal: Skin Health and Integrity  Intervention: Optimize Skin Protection  Recent Flowsheet Documentation  Taken 11/04/2020 1200 by Craige Cotta, RN  Pressure Reduction Techniques: frequent weight shift encouraged  Head of Bed (HOB) Positioning: HOB at 30-45 degrees  Pressure Reduction Devices:  ??? pressure-redistributing mattress utilized  ??? positioning supports utilized  Taken 11/04/2020 0800 by Craige Cotta, RN  Pressure Reduction Techniques: frequent weight shift encouraged  Head of Bed (HOB) Positioning: HOB at 30-45 degrees  Pressure Reduction Devices:  ??? pressure-redistributing mattress utilized  ??? positioning supports utilized     Problem: Impaired Wound Healing  Goal: Optimal Wound Healing  Intervention: Promote Wound Healing  Recent Flowsheet Documentation  Taken 11/04/2020 1000 by Craige Cotta, RN  Activity Management: activity adjusted per tolerance  Taken 11/04/2020 0800 by Craige Cotta, RN  Activity Management: activity adjusted per tolerance     Problem: Respiratory Compromise (Pneumonia)  Goal: Effective Oxygenation and Ventilation  Outcome: Ongoing - Unchanged  Intervention: Optimize Oxygenation and Ventilation  Recent Flowsheet Documentation  Taken 11/04/2020 1200 by Craige Cotta, RN  Head of Bed [facility]) Positioning: HOB at 30-45 degrees  Taken 11/04/2020 0800 by Craige Cotta, RN  Head of Bed [facility]) Positioning: HOB at 30-45 degrees     Problem: Respiratory Compromise (Pneumonia)  Goal: Effective Oxygenation and Ventilation  Intervention: Optimize Oxygenation and Ventilation  Recent Flowsheet Documentation  Taken 11/04/2020 1200 by Craige Cotta, RN  Head of Bed [facility]) Positioning: HOB at 30-45 degrees  Taken 11/04/2020 0800 by Craige Cotta, RN  Head of Bed [facility]) Positioning: HOB at 30-45 degrees     Problem: Infection  Goal: Absence of Infection Signs and Symptoms  Intervention: Prevent or Manage Infection  Recent Flowsheet Documentation  Taken 11/04/2020 0800 by Craige Cotta, RN  Infection Management: aseptic technique maintained  Isolation Precautions:  ??? contact precautions maintained  ??? protective precautions maintained

## 2020-11-04 NOTE — Unmapped (Signed)
Problem: Adult Inpatient Plan of Care  Goal: Plan of Care Review  Outcome: Progressing  Goal: Patient-Specific Goal (Individualized)  Outcome: Progressing  Goal: Absence of Hospital-Acquired Illness or Injury  Outcome: Progressing  Intervention: Identify and Manage Fall Risk  Recent Flowsheet Documentation  Taken 11/03/2020 2000 by Shawnee Knapp, RN  Safety Interventions:   aspiration precautions   fall reduction program maintained   family at bedside   isolation precautions   lighting adjusted for tasks/safety   low bed  Intervention: Prevent Skin Injury  Recent Flowsheet Documentation  Taken 11/04/2020 0400 by Shawnee Knapp, RN  Skin Protection: adhesive use limited  Taken 11/04/2020 0200 by Shawnee Knapp, RN  Skin Protection: adhesive use limited  Taken 11/04/2020 0000 by Shawnee Knapp, RN  Skin Protection: adhesive use limited  Taken 11/03/2020 2200 by Shawnee Knapp, RN  Skin Protection: adhesive use limited  Taken 11/03/2020 2000 by Shawnee Knapp, RN  Skin Protection:   adhesive use limited   silicone foam dressing in place   transparent dressing maintained   tubing/devices free from skin contact  Intervention: Prevent and Manage VTE (Venous Thromboembolism) Risk  Recent Flowsheet Documentation  Taken 11/04/2020 0400 by Shawnee Knapp, RN  Activity Management: activity adjusted per tolerance  Range of Motion: Bilateral Upper and Lower Extremities  Taken 11/04/2020 0200 by Shawnee Knapp, RN  Activity Management: activity adjusted per tolerance  Taken 11/04/2020 0000 by Shawnee Knapp, RN  Range of Motion: Bilateral Upper and Lower Extremities  Taken 11/03/2020 2200 by Shawnee Knapp, RN  Activity Management: activity adjusted per tolerance  Taken 11/03/2020 2000 by Shawnee Knapp, RN  Activity Management: activity adjusted per tolerance  VTE Prevention/Management: anticoagulant therapy  Range of Motion: Bilateral Upper and Lower Extremities  Goal: Optimal Comfort and Wellbeing  Outcome: Progressing  Goal: Readiness for Transition of Care  Outcome: Progressing  Goal: Rounds/Family Conference  Outcome: Progressing     Problem: Infection  Goal: Absence of Infection Signs and Symptoms  Outcome: Progressing  Intervention: Prevent or Manage Infection  Recent Flowsheet Documentation  Taken 11/03/2020 2000 by Shawnee Knapp, RN  Isolation Precautions:   protective precautions maintained   contact precautions maintained     Problem: Fluid Imbalance (Pneumonia)  Goal: Fluid Balance  Outcome: Progressing     Problem: Infection (Pneumonia)  Goal: Resolution of Infection Signs and Symptoms  Outcome: Progressing  Intervention: Prevent Infection Progression  Recent Flowsheet Documentation  Taken 11/03/2020 2000 by Shawnee Knapp, RN  Isolation Precautions:   protective precautions maintained   contact precautions maintained     Problem: Respiratory Compromise (Pneumonia)  Goal: Effective Oxygenation and Ventilation  Outcome: Progressing  Intervention: Promote Airway Secretion Clearance  Recent Flowsheet Documentation  Taken 11/03/2020 2000 by Shawnee Knapp, RN  Cough And Deep Breathing: done independently per patient  Intervention: Optimize Oxygenation and Ventilation  Recent Flowsheet Documentation  Taken 11/04/2020 0400 by Shawnee Knapp, RN  Head of Bed [facility]) Positioning: HOB at 30-45 degrees  Taken 11/04/2020 0200 by Shawnee Knapp, RN  Head of Bed [facility]) Positioning: HOB at 30-45 degrees  Taken 11/04/2020 0000 by Shawnee Knapp, RN  Head of Bed [facility]) Positioning: HOB at 30-45 degrees  Taken 11/03/2020 2200 by Shawnee Knapp, RN  Head of Bed [facility]) Positioning: HOB at 30-45 degrees  Taken 11/03/2020 2000 by Shawnee Knapp, RN  Head of Bed [facility]) Positioning: HOB at 30-45 degrees  Airway/Ventilation  Management:   airway patency maintained   calming measures promoted   humidification applied   pulmonary hygiene promoted     Problem: Fall Injury Risk  Goal: Absence of Fall and Fall-Related Injury  Outcome: Progressing  Intervention: Promote Scientist, clinical (histocompatibility and immunogenetics) Documentation  Taken 11/03/2020 2000 by Shawnee Knapp, RN  Safety Interventions:   aspiration precautions   fall reduction program maintained   family at bedside   isolation precautions   lighting adjusted for tasks/safety   low bed     Problem: Skin Injury Risk Increased  Goal: Skin Health and Integrity  Outcome: Progressing  Intervention: Optimize Skin Protection  Recent Flowsheet Documentation  Taken 11/04/2020 0400 by Shawnee Knapp, RN  Pressure Reduction Techniques:   frequent weight shift encouraged   heels elevated off bed   pressure points protected  Head of Bed (HOB) Positioning: HOB at 30-45 degrees  Pressure Reduction Devices:   positioning supports utilized   pressure-redistributing mattress utilized   specialty bed utilized  Skin Protection: adhesive use limited  Taken 11/04/2020 0200 by Shawnee Knapp, RN  Pressure Reduction Techniques:   frequent weight shift encouraged   heels elevated off bed   pressure points protected  Head of Bed (HOB) Positioning: HOB at 30-45 degrees  Pressure Reduction Devices:   positioning supports utilized   pressure-redistributing mattress utilized   specialty bed utilized  Skin Protection: adhesive use limited  Taken 11/04/2020 0000 by Shawnee Knapp, RN  Pressure Reduction Techniques:   frequent weight shift encouraged   heels elevated off bed   pressure points protected  Head of Bed (HOB) Positioning: HOB at 30-45 degrees  Pressure Reduction Devices:   positioning supports utilized   pressure-redistributing mattress utilized   specialty bed utilized  Skin Protection: adhesive use limited  Taken 11/03/2020 2200 by Shawnee Knapp, RN  Pressure Reduction Techniques:   frequent weight shift encouraged   heels elevated off bed  Head of Bed (HOB) Positioning: HOB at 30-45 degrees  Pressure Reduction Devices:   positioning supports utilized   heel offloading device utilized   pressure-redistributing mattress utilized  Skin Protection: adhesive use limited  Taken 11/03/2020 2000 by Shawnee Knapp, RN  Pressure Reduction Techniques:   frequent weight shift encouraged   heels elevated off bed   positioned off wounds  Head of Bed (HOB) Positioning: HOB at 30-45 degrees  Pressure Reduction Devices:   positioning supports utilized   heel offloading device utilized   pressure-redistributing mattress utilized  Skin Protection:   adhesive use limited   silicone foam dressing in place   transparent dressing maintained   tubing/devices free from skin contact     Problem: Self-Care Deficit  Goal: Improved Ability to Complete Activities of Daily Living  Outcome: Progressing     Problem: Impaired Wound Healing  Goal: Optimal Wound Healing  Outcome: Progressing  Intervention: Promote Wound Healing  Recent Flowsheet Documentation  Taken 11/04/2020 0400 by Shawnee Knapp, RN  Activity Management: activity adjusted per tolerance  Taken 11/04/2020 0200 by Shawnee Knapp, RN  Activity Management: activity adjusted per tolerance  Taken 11/03/2020 2200 by Shawnee Knapp, RN  Activity Management: activity adjusted per tolerance  Taken 11/03/2020 2000 by Shawnee Knapp, RN  Activity Management: activity adjusted per tolerance  Sleep/Rest Enhancement: consistent schedule promoted     Problem: Hypertension Comorbidity  Goal: Blood Pressure in Desired Range  Outcome: Progressing     Problem: Obstructive Sleep Apnea Risk  or Actual Comorbidity Management  Goal: Unobstructed Breathing During Sleep  Outcome: Progressing     Problem: Non-Violent Restraints  Goal: Patient will remain free of restraint events  Outcome: Resolved  Goal: Patient will remain free of physical injury  Outcome: Resolved     Problem: Communication Impairment (Mechanical Ventilation, Invasive)  Goal: Effective Communication  Outcome: Resolved     Problem: Device-Related Complication Risk (Mechanical Ventilation, Invasive)  Goal: Optimal Device Function  Outcome: Resolved  Intervention: Optimize Device Care and Function  Recent Flowsheet Documentation  Taken 11/03/2020 2000 by Shawnee Knapp, RN  Aspiration Precautions:   awake/alert before oral intake   distractions minimized during oral intake   oral hygiene care promoted   respiratory status monitored  Airway Safety Measures: suction at bedside     Problem: Inability to Wean (Mechanical Ventilation, Invasive)  Goal: Mechanical Ventilation Liberation  Outcome: Resolved  Intervention: Promote Extubation and Mechanical Ventilation Liberation  Recent Flowsheet Documentation  Taken 11/03/2020 2000 by Shawnee Knapp, RN  Sleep/Rest Enhancement: consistent schedule promoted     Problem: Nutrition Impairment (Mechanical Ventilation, Invasive)  Goal: Optimal Nutrition Delivery  Outcome: Resolved     Problem: Skin and Tissue Injury (Mechanical Ventilation, Invasive)  Goal: Absence of Device-Related Skin and Tissue Injury  Outcome: Resolved  Intervention: Maintain Skin and Tissue Health  Recent Flowsheet Documentation  Taken 11/04/2020 0400 by Shawnee Knapp, RN  Device Skin Pressure Protection:   absorbent pad utilized/changed   adhesive use limited  Taken 11/04/2020 0200 by Shawnee Knapp, RN  Device Skin Pressure Protection:   absorbent pad utilized/changed   adhesive use limited  Taken 11/04/2020 0000 by Shawnee Knapp, RN  Device Skin Pressure Protection:   absorbent pad utilized/changed   adhesive use limited  Taken 11/03/2020 2200 by Shawnee Knapp, RN  Device Skin Pressure Protection: absorbent pad utilized/changed  Taken 11/03/2020 2000 by Shawnee Knapp, RN  Device Skin Pressure Protection:   absorbent pad utilized/changed   adhesive use limited     Problem: Ventilator-Induced Lung Injury (Mechanical Ventilation, Invasive)  Goal: Absence of Ventilator-Induced Lung Injury  Outcome: Resolved  Intervention: Prevent Ventilator-Associated Pneumonia  Recent Flowsheet Documentation  Taken 11/04/2020 0400 by Shawnee Knapp, RN  Head of Bed [facility]) Positioning: HOB at 30-45 degrees  Oral Care:   oral rinse provided   mouth swabbed  Taken 11/04/2020 0200 by Shawnee Knapp, RN  Head of Bed [facility]) Positioning: HOB at 30-45 degrees  Oral Care:   oral rinse provided   mouth swabbed  Taken 11/04/2020 0000 by Shawnee Knapp, RN  Head of Bed [facility]) Positioning: HOB at 30-45 degrees  Oral Care:   oral rinse provided   mouth swabbed  Taken 11/03/2020 2200 by Shawnee Knapp, RN  Head of Bed [facility]) Positioning: HOB at 30-45 degrees  Oral Care:   mouth swabbed   oral rinse provided  Taken 11/03/2020 2000 by Shawnee Knapp, RN  Head of Bed [facility]) Positioning: HOB at 30-45 degrees  Oral Care:   mouth swabbed   oral rinse provided

## 2020-11-04 NOTE — Unmapped (Signed)
MICU Evening Summary     Date of Service: 11/03/2020    Interval History: Adam 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 He presented 7/31 to ED w two days of SOB and tachycardia c/f sepsis w new RLL consolidation (recent CT negative 3d pta). Critical care services are indicated for:    Principal Problem:    Pneumonia  Active Problems:    Tobacco use disorder    AKI (acute kidney injury) (CMS-HCC)    MRSA bacteremia    Recurrent major depressive disorder, in partial remission (CMS-HCC)    Immunocompromised (CMS-HCC)    Hypogammaglobulinemia (CMS-HCC)    ALL (acute lymphoblastic leukemia) (CMS-HCC)    Respiratory failure with hypoxia (CMS-HCC)    Chronic pain    Acute kidney injury superimposed on CKD (CMS-HCC)    COPD (chronic obstructive pulmonary disease) (CMS-HCC)  Resolved Problems:    * No resolved hospital problems. *        Assessment & Plan     N: cont home gaba, elavil, baseline deaf Rt ear  P: HFNC/CPAP at night.   CV: normotensive, echo wnl  GI: tube feed started, adv as tol to goal, cont bowel regimen  R:s/p 2L LR for metab acidosis in setting of AKI, bicarb added, pred inc to 20 for DRESS.  H: heparin ppx, pvl neg,   E: accu checks  Onc: ok to restart chemo per onc  ID: ppx w cresemba, bactrim, valtrex, cont linezolid, mero, mica >> changed mero to cefepime and added azithr per ICID recs, prelim cx w [facility] bacteremia and pna, MRSA    Critical Care Attestation     This patient is critically ill or injured with the impairment of vital organ systems such that there is a high probability of imminent or life threatening deterioration in the patient's condition. This patient must remain in the ICU for ongoing evaluation of the comprehensive management plan outlined in this note. I directly provided critical care services as documented in this note and the critical care time spent (40  min) is exclusive of separately billable procedures.  In addition to time spent for critical care management, I also provided advance care planning services for 0  minutes (see ACP note for details)???. Total billable critical care time 40  minutes.    Lanae Crumbly, NP

## 2020-11-04 NOTE — Unmapped (Cosign Needed)
IMMUNOCOMPROMISED HOST INFECTIOUS DISEASE PROGRESS NOTE    Assessment/Plan:     Adam 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  - 4/21 BMBx normocellular bone marrow 50% w/ 1% blasts (MRD)  - Extent of disease/CNS involvement: rare blast on prior CSF, intrathecal??ppx (cytarabine, methotrexate, hydrocortisone) last 09/18/2020  - Cancer-related complications: TLS, hyperbilirubinemia, MRSA bacteremia w/ septic emboli/renal failure+dialysis/intubation, C. krusei fungemia  - Prior chemotherapy: [id number] induction with dasatinib (vincristine, dexamethasone and dasatinib); C1D1 01/24/2020; difficulty tolerating single-agent dasatinib  - Current chemotherapy: blinatumomab (anti-CD19/CD3), C1D1 = 3/18 (symtomatic, titrated up dose and restarted 3/21) + ponatinib (TKI)  - currently cycle 3/5  -??Right IJ Port-A-Cath??placed 04/15/2020  - Infection prophylaxis prior to admission: Bactrim 1 DS MWF, valacyclovir, s/p Evusheld 04/21/2020 x2 doses, isavuconazole  - on pred 15mg  daily PTA as below  ??  Likely DIHS/DRESS, 06/12/2020 and new rash 8/01  - followed by Dr. Caryn Section, dermatology  - 3/11 punch bx  - per prior ID notes possible culprits ceftaroline, posaconazole, dasatinib, sotrovimab  - received high dose steroids in 06/2020, 3/3 - 3/7 pred 50 x5d; 06/19/20 started at 50 mg prednisone taper  -??flare on pred 10 mg daily  - pink rash noted on chest on 8/01, with rising eosinophils. Dermatology consulted and noted concern for DRESS flare, so cefepime discontinued and started on prednisone  - 8/02 improvement in rash and resolution in eosinophilia  - 8/03 itching at baseline, but rising eosinophils  ??  Pertinent Co-morbidities  None  ??  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  Infection History  Active infections:??  #MRSA bacteremia, 11/01/2020  #RLL pulmonary consolidation on 11/01/2020 CXR, new from 7/28 CT  - 7/31 BCx 1/2 Staph aureus  - 7/31 LRCx Staph aureus  - 8/01 TTE without evidence of endocarditis  - S/p Port-a-cath removal on 8/03  Rx: 7/31 linezolid/cefepime --> linezolid/daptomycin/cefepime -->8/02 linezolid/daptomycin  ??  #Non-neutropenic fever and new pulmonary nodules/infiltrates, 07/17/20  - 4/15 BC x2 NGTD  - 4/16 CT Chest: multifocal somewhat nodular consolidative opacities in b/l lungs that are relatively new, most concerning for infectious PNA (possibly fungal, especially given his recent high dose steroid taper)  - 4/17 LRCx +2??MRSA  - 4/17 Legionella uAg, serum crypto Ag, serum Asp galactomannan, urine histo ag, B-D-glucan (Fungitell), serum CMV neg  - 4/18 bronch with FUX:NATF cx + MRSA, OP flora,??RPP negative, Legionella cx, AFB cx, fungal cx, CMV PCR, PJP DFA, Asp galactomannan neg  - 7/28 CT chest resolved  Rx:  4/15 cefepime/dapto --> 4/16 cefepime --> 4/18 cefepime/isavu/linezolid -> 4/19 linezolid --> 4/21 isavuconazole/linezolid --> 5/10 isavuconazole -->  ??  #Intertrigo in groin, 11/01/2020  ??  Prior infections:  ??  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  #COVID-19 Pneumonia, pos 05/28/2020 and 09/21/2020  - vaccinated 02/2020, 03/2020  - s/p Evusheld x1 04/21/2020  - prescribed molnupiravir at home, per prior notes did not take  - admitted 2/27 - 06/01/20 for observation, given sotrovimab  ??  #MRSA bacteremia +  PNA + TV IE 01/27/20  - bacteremic 10/25-11/6, cleared 11/8  - pyomyositis thoracic spine, L arm  - 10/29 DISRUPT trial, exebacase v. Placebo  Tx: 10/25 vanc/cefepime -->??10/27 dapto/ceftaroline -->??11/5 ceftaroline/vanc ??->??11/16 vanc (stopped due to ototoxicity though other meds were more likely) -->Ceftaroline (w/ 1 dose DAP) 11/28 ->12/02-->Vanc 12/02 - ?  ??  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  #tree-in-bud nodularity, resolving on CT 04/28/20  ??  Antimicrobial Intolerance/allergy  ??  Cefepime - rash with concern for DRESS flare  Ceftaroline - DRESS  Dapsone - possible agranulocytosis, per chart anaphylaxis  Vancomycin - probable ototoxicity (in combination with furosemide)  Isavuconazole - elevated LFTs in the setting of TKI       RECOMMENDATIONS FOR 11/04/2020    Diagnostic  ?? Follow up 8/02 BCx  ?? Daily diff to trend eosinophils   ?? Trend BCx q48h until clear  ?? Follow up urine histo Ag  ??  Antibiotic toxicity monitoring  ?? At least weekly CBC w/ diff, CMP, CK    Treatment  For MRSA bacteremia + suspected MRSA PNA  ?? CONT Daptomycin 10mg /kg (dosing per pharmacy) for bacteremia, linezolid 600mg  q12h (via OGT or IV) for PNA  ??  For intertrigo  ?? Topical antifungal, clotrimazole or nystatin powder, to groin  ??  For hx pulmonary nodules  ?? CONT isavuconazole 372mg  daily (via OG or IV) for now  ??  For history of DRESS and new rash  ?? Appreciate dermatology consult  ?? Avoid cephalosporins  ?? Ok to increase steroids if concern for DRESS  ??  Prophylaxis  ?? CONT valacyclovir  ?? CONT Bactrim 1 DS tab MWF  ?? CONT isavuconazole  ??  Immunizations  ?? COVID-19 booster prior to discharge  ?? Prevnar-20  ?? Shingrix  All can be given prior to discharge            The [facility] ID service will continue to follow.  Please page the ID Transplant/Liquid Oncology Fellow consult at [phone] with questions.  Patient discussed with Dr. Para March.    Sarita Haver, MSPH, MD  Fellow, [facility] Division of Infectious Diseases    Subjective:     Interval History: Extubated yesterday, and went for port removal this AM. This afternoon, he reports that he is feeling well. He is not feeling short of breath on the HFNC, although he has to remind himself to breathe through his nose. He has had some mild coughing. The pain in his right lower chest has persisted, but is well-controlled with pain medications, and he is able to take deep breaths when he receives his pain medication. His red chest rash has resolved, but he says he continues to feel itchy all over. He notes that this is normal for him, and he does not think that this is worse than his baseline. He has been scratching his back, which is where he is the itchiest. He has not had subjective fevers, chills, nausea, vomiting, or diarrhea.  History obtained from:patient and nurse.    Medications:  Antimicrobials:  Anti-infectives (From admission, onward)    Start     Dose/Rate Route Frequency Ordered Stop    11/01/20 1700  DAPTOmycin (CUBICIN) 1,150 mg in sodium chloride (NS) 0.9 % 50 mL IVPB         10 mg/kg ?? 113.4 kg  160 mL/hr over 30 Minutes Intravenous Every 24 hours 11/01/20 1630 11/15/20 1659    11/01/20 0900  isavuconazonium sulfate (CRESEMBA) capsule 372 mg  372 mg Oral Daily (standard) 11/01/20 0531      11/01/20 0900  linezolid in dextrose 5% (ZYVOX) 600 mg/300 mL IVPB 600 mg         600 mg  300 mL/hr over 60 Minutes Intravenous Every 12 hours scheduled 11/01/20 0531 11/11/20 0859          Prior/Current immunomodulators: prednisone 20 mg daily, blinatumomab (anti-CD19/CD3)    Other medications reviewed.    Objective:     Vital Signs last 24 hours:  Temp:  [36.6 ??C (97.9 ??F)-37 ??C (98.6 ??F)] 36.6 ??C (97.9 ??F)  Core Temp:  [36.6 ??C (97.88 ??F)-37.3 ??C (99.14 ??F)] 36.7 ??C (98.06 ??F)  Heart Rate:  [73-94] 75  SpO2 Pulse:  [73-94] 75  Resp:  [9-37] 14  BP: (167-171)/(88-105) 171/105  MAP (mmHg):  [122] 122  A BP-2: (145-189)/(76-104) 162/85  MAP:  [98 mmHg-130 mmHg] 108 mmHg  FiO2 (%):  [40 %-80 %] 50 %  SpO2:  [90 %-98 %] 96 %    Physical Exam:  Patient Lines/Drains/Airways Status     Active Active Lines, Drains, & Airways     Name Placement date Placement time Site Days    Urethral Catheter 11/01/20  1600  --  3    Peripheral IV 11/01/20 Left Forearm 11/01/20  0330  Forearm  3    Peripheral IV 11/01/20 Anterior;Proximal;Right Forearm 11/01/20  1200  Forearm  3    Arterial Line 11/01/20 Left Radial 11/01/20  1200  Radial  3              GEN:  looks well, no apparent distress  EYES: sclerae anicteric and non injected  ZOX:WRUEAVWUJ good and moist mucous membranes  LYMPH:no cervical or supraclavicular LAD  CV:RRR, no abnormal heart sounds noted and no peripheral edema  PULM:normal work of breathing at rest, TTP on R lower lateral chest, breathing comfortably on HFNC  WJ:XBJY, NTND  NW:GNFAOZHY  RECTAL:deferred  SKIN:erythema on back, resolution of chest erythema, no raised lesions  MSK:no swollen joints  NEURO:no tremor noted, facial expression symmetric and moves extremities equally  PSYCH:interactive and pleasant    Data for Medical Decision Making     Recent Labs   Lab Units 11/04/20  0418 11/03/20  1524 11/03/20  1015 11/03/20  0506 11/02/20  2021 11/02/20  1712 11/02/20  1114 11/02/20  0827 11/02/20  0510 11/01/20  1646 11/01/20  1547 11/01/20  1322 11/01/20  0628 11/01/20  0148 11/01/20  0004 11/01/20  0004   WBC 10*9/L 12.1*  --   --  13.1*  --   --   --   --  22.3*  --   --   --  25.7*  --   --  38.7*   HEMOGLOBIN g/dL 86.5*  --   --  9.9*  --   --   --   --  11.3*  --   --   --  13.6  --   --  16.1   HEMOGLOBIN BG g/dL  --  78.4*  --   --   --   --   --  6.9*  --   --   --    < >  --   --   --   --    PLATELET COUNT (1) 10*9/L 191  --   --  173  --   --   --   --  192  --   --   --  198  --   --  185   NEUTRO ABS 10*9/L 10.5*  --   --  12.5*  --   --   --   --  20.0*  --   --   --  24.2*  --   --  35.3*   LYMPHO ABS 10*9/L 0.5*  --   --  0.2*  --   --   --   --  0.4*  --   --   --  0.7*  --   --  1.1   EOSINO ABS 10*9/L 0.6*  --   --  0.1  --   --   --   --  1.1*  --   --   --  0.0  --   --  0.0   BUN mg/dL 28*  --   --  45*  --  42*  --   --  42*  -- 36*  --  22 27*  --  21   CREATININE mg/dL 0.96*  --   --  0.45*  --  2.54*  --   --  2.74*  --  2.44*  --  2.40* 2.46*  --  2.24*   AST U/L 19  --   --  16  --   --   --   --  9  --   --   --  16 21  --   --    ALT U/L 24  --   --  22  --   --   --   --  27  --   --   --  48 61*  --   --    BILIRUBIN TOTAL mg/dL 0.2*  --   --  0.2*  --   --   --   --  0.2*  --   --   --  0.3 0.5  --   --    ALK PHOS U/L 106  --   --  100  --   --   --   --  87  --   --   --  93 104  --   --    POTASSIUM WHOLE BLOOD mmol/L  --  3.8 3.7 3.6   < >  --    < > 3.4 4.3   < >  --    < >  --   --   --   --    POTASSIUM mmol/L 3.7  --   --  4.3  --  4.3  --   --  4.5  --  4.8  --  4.4 4.4 - 4.4   < >  --    MAGNESIUM mg/dL 2.0  --   --  2.5  --   --   --   --  2.2  --  1.3*  --  1.6  --   --  1.7   PHOSPHORUS mg/dL 2.7  --   --  4.2  --   --   --   --  5.3*  --  4.8  --  5.4*  --   --  5.1   CALCIUM mg/dL 8.8  --   --  8.1*  --  7.9*  --   --  8.3*  --  7.5*  --  8.9 9.8  --  9.8   CK TOTAL U/L  --   --   --   --   --   --   --   --   --   --  38.0*  --   --   --   --   --     < > = values in this interval not displayed.       New Micro Data  7/31 BCx MRSA in 1/2   7/31 LRCx MRSA  7/31 UCx NG  8/01 urine legionella Ag negative  8/01 SCrAg negative  8/01 urine histo Ag pending  8/02 BCx NG  8/06 Catheter tip Cx pending    Recent Studies  CXR 7/30:  Patchy opacification along the medial right lung base with asymmetrical elevation of the hemidiaphragm, may reflect atelectasis, however, developing infectious process (e.g. pneumonia) is not entirely excluded.    CXR 7/31:  1.Interval placement of an endotracheal tube with tip terminating above the level of the carina.  2.Suspect small-to-moderate bilateral pleural effusions with associated passive atelectasis of adjacent lung parenchyma (right greater than left).  3.Additional findings suggestive of mild interstitial pulmonary edema, as above.    Abdominal XR 8/01:  Esophagogastric tube in appropriate positioning.    TTE 8/01:    1. No evidence of valvular or endocardial vegetation.    2. The left ventricle is normal in size with normal wall thickness.    3. The left ventricular systolic function is normal, LVEF is visually  estimated at > 55%.    4. The right ventricle is normal in size, with normal systolic function.    PVL VENOUS DUPLEX LOWER EXTREMITY BILATERAL 8/01:  Right  There is no evidence of DVT in the lower extremity. There is no evidence of obstruction proximal to the inguinal ligament or in the common femoral vein.  Left  There is no evidence of DVT in the lower extremity. There is no evidence of obstruction proximal to the inguinal ligament or in the common femoral vein.    PVL VENOUS DUPLEX UPPER EXTREMITY BILATERAL 8/01:  Right  No evidence of DVT detected in the central veins or arm veins.  Left  No evidence of DVT detected in the central veins or arm veins.     CXR 8/02:   No acute changes.    CXR 8/02 PM:  Persistent bibasilar consolidation right greater than left. Endotracheal tube removed.

## 2020-11-04 NOTE — Unmapped (Signed)
ADULT SPECIALTY CARE TEAM  Transport Summary Note     Departing Unit: MICU Departure Time: 1000   Unit Returned To: MICU Return Time: 1125           Report received from primary nurse via SBARq. Patient prepared to transport to Interventional Radiology via stretcher under ICU Transport Protocol. Vital signs during transport, see vital signs section of DocFlowsheet for further details. Patient is alert and able to follow commands. O2 via CPAP @ 50 %. Patient tolerated procedure well. Pandemic precautions maintained throughout transport.     Returned to MICU, update and care given to primary nurse. See Doc Flowsheet for additional transport documentation.

## 2020-11-04 NOTE — Unmapped (Signed)
AM assessment, Pt awake and alert, interacting with family and caregivers. Was able to do aggressive wean on ventilator, weaning peep from 12 to 8, then weaning to PS. Pt performed an SBT and passed. Pt extubated to CPAP, then HFNC. Pt is in no distress and comfortable, but has desated throughout the day to mid 80s. FIO2 has been increased to 80% and SPO2 in low 90s. Continue to monitor.

## 2020-11-04 NOTE — Unmapped (Signed)
[facility]  Initial Psychiatry Consult Note     Service Date: November 04, 2020  LOS:  LOS: 3 days      Assessment:   Adam [name] with pertinent past medical and psychiatric diagnoses of PH+ B cell ALL, major depressive disorder, PTSD, anxiety, and insomnia. admitted 11/01/2020 12:02 AM for respiratory failure due to pneumonia.Patient was seen in consultation by Psychiatry at the request of [name], MD with Medical ICU (MDI) for evaluation of Depression.     The patient's current presentation of low mood, anhedonia, and negative thoughts is most consistent with his historical diagnosis of depression. He has had increased anxiety during his hospital stay mostly triggered by frustration and the thought of a long hospitalization. He reports past symptoms consistent with previous PTSD diagnosis (military trauma, first responder trauma) as well, but depression and anxiety symptoms are more at this poin with very rare nightmares. His nortriptyline was recently increased by his outpatient provider for his mood, and he identifies anxiety as the main symptom we can help him address in the hospital. He is not getting his home clonazepam while here due to concerns about a long-acting benzodiazepine being more risky for respiratory depression, and since he has had some success with lorazepam, we recommend lorazepam as needed for anxiety, with details below. There is potential for serotonin syndrome with the combination of nortriptyline and linezolid, but we are not concerned for this currently. He declined additional [facility] support while hospitalized and has his outpatient therapist's support information. We will follow up to assess for use and efficacy of lorazepam prns and to assess for any signs of serotonin syndrome or delirium.    Please see below for detailed recommendations.    Diagnoses:   Active Hospital problems:  Principal Problem:    Pneumonia  Active Problems:    Tobacco use disorder AKI (acute kidney injury) (CMS-HCC)    MRSA bacteremia    Recurrent major depressive disorder, in partial remission (CMS-HCC)    Immunocompromised (CMS-HCC)    Hypogammaglobulinemia (CMS-HCC)    ALL (acute lymphoblastic leukemia) (CMS-HCC)    Respiratory failure with hypoxia (CMS-HCC)    Chronic pain    Acute kidney injury superimposed on CKD (CMS-HCC)    COPD (chronic obstructive pulmonary disease) (CMS-HCC)     Problems edited/added by me:  No problems updated.    Safety Risk Assessment:  ASQ    Last ASQ Risk Calculation: No intervention is necessary (10/31/20 2332)  No BSA was done since ASQ showed no intervention necessary. A suicide and violence risk assessment was performed as part of this evaluation. Specific questioning about thoughts, plans, suicidal intent, and self-harm: Denies thoughts of self-harm. Denies suicidal ideation, plans, or intent. . Risk factors for self-harm/suicide: previous suicide attempt(s), current diagnosis of depression, recent bereavement and recent onset of serious medical condition. Protective factors against self-harm/suicide:  lack of active SI, motivation for treatment, currently receiving mental health treatment, has access to clinical interventions and support, utilization of positive coping skills, supportive family, sense of responsibility to family and social supports, presence of a significant relationship, presence of an available support system, expresses purpose for living, current treatment compliance, effective problem solving skills, safe housing and support system in agreement with treatment recommendations. Risk factors for harm to others: recent loss. Protective factors against harm to others: no active symptoms of psychosis, no active symptoms of mania, intolerant attititude toward deviance, positive social orientation and connectedness to family. Based  on my clinical evaluation, I estimate the patient to be at low risk for suicide in the current setting. Recommendations:   ## Safety and Observation Level:   -- Based on the BSA or psychiatric evaluation, we estimate the patient to be at low risk for suicide in the current setting. We recommend routine level of observation on medical unit. This decision is based on my review of the chart including patient's history and current presentation, interview of the patient, mental status examination, and consideration of suicide risk including evaluating suicidal ideation, plan, intent, suicidal or self-harm behaviors, risk factors, and protective factors. This judgment is based on our ability to directly address??suicide??risk, implement??suicide??prevention strategies and develop a safety plan while the patient is in the clinical setting.   -- If the patient attempts to leave against medical advice and it is felt to be unsafe for them to leave, please call a Behavioral Response and page Psychiatry at [phone].  - Please contact our team if there is a concern that risk level has changed.    ## Medications:   -- Start lorazepam 0.5mg  BID prn anxiety. Home medication is clonazepam 0.5mg  nightly   -- Continue nortriptyline 75mg  daily. Home medication, dose recently increased before hospitalization  -- Continue Melatonin 3 mg q1800 for sleep and circadian rhythm regulation.   -- Offered NRT but patient declined    ## Medical Decision Making Capacity:   -- A formal capacity assessement was not performed as a part of this evaluation.  If specific capacity questions arise, please contact our team as below.     ## Further Work-up:   -- If patient becomes tachycardic, flushed, diaphoretic, febrile, agitated, or shows other signs of serotonin syndrome, linezolid combined with nortriptyline could be the cause  -- Continue to work-up and treat possible medical conditions that may be contributing to current presentation.     ## Disposition:   -- The patient will follow-up with [name] NP with [facility] for mental health care at the time of discharge.    ## Behavioral / Environmental:   -- Although not currently delirious, the patient is at an elevated risk for developing delirium. Please utilize delirium prevention protocol.  -- Please order Delirium (prevention) protocol: the following can be copied into a single misc nursing order.        - RN to open blinds every morning.        - To bedside: glasses, hearing aide, patient's own shoes. Make available to patient's when possible and encourage use.        - RN to assess orientation (person, place, & time) qam and prn, with frequent reorientation (verbal & whiteboard) & introduction of caregivers. ??         - Recommend extended visiting hours with familiar family/friends as feasible.        - Encourage normal sleep-wake cycle by promoting a dark, quiet environment at night and stimulating, light environment during the day. ??        - Turn the TV off when patient is asleep or not in use.    Thank you for this consult request. Recommendations have been communicated to the primary team.  We will follow as needed at this time. Please page [phone] for any questions or concerns.     Scribe's Attestation: Maralyn Sago, MD obtained and performed the history, physical exam and medical decision making elements that were entered into the chart. Signed by Franne Forts, Scribe, November 04, 2020 3:42 PM.  This patient was evaluated in person.    Discussed with and seen by Attending, Horton Marshall, MD, who agrees with the assessment and plan.    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 Patient presented 7/30 with new onset SOB, productive cough, and chills as well as right sided chest pain. HE did not report any altered mental status or neurologic changes.  CXR in ED with RLL opacity concerning for pneumonia. His respiratory status declined and he was emergently intubated in MICU. Febrile to 38.9.  WBC elevated to 38.7 with 90+% neutrophils. He has been found to have MRSA bacteremia and suspected MRSA PNA. He has been treated with Daptomycin (7/31 - p) and Linezolid (7/31 - p) with marked improvement and extubation on 8/2. He had similar admission in 07/2020, MRSA on bronch.    Patient Report:   Pt is lying comfortably in bed resting with his wife present at bedside. When asked about his mood, pt states, ???I haven???t been too well,??? 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 Explains that once he gets over one hurdle, there is always something else. Describes his mood more recently as ???depressed, very anxious, very down.??? When feeling down, pt states he doesn???t have a rhyme or reason to feel down but just does - states he is just waiting for the next negative thing to happen. When asked about SI, pt states he has considered why people would end their lives and can understand it in the setting of his medical condition, although he notes he has no intentions to end his life. Identifies his family as a protective factor. Explains he will not put his family through another loss, given he and his wife recently lost their son. Endorses seeing a therapist outside of the hospital but says he does not really talk to anyone except his wife (as his desire to talk comes in spells and his wife is most often the only one around). States he has not met with his therapist in a while, who is outside of [facility]. When asked if he would be interested in meeting with the therapists or social workers in the cancer hospital, pt states he would rather speak with his therapist or his wife. Denies currently being a part of any support groups and has no interest in joining one.     When asked about anxiety, pt describes it as both mental and physical (including sweating and shaking). States anxiety comes out of nowhere and is like a panic attack -- pt eventually able to identify these symptoms as anxiety. Things like the oxygen mask and beeping of machinery can trigger his anxiety. Pt states his anxiety worsened prior to this hospitalization. Per wife, pt told his therapist his anxiety had worsened which is why pt's home nortriptyline dose was increased (50 mg to 75 mg). However, pt feels the new dose did not have long enough to take effect before he got sick/ was hospitalized (only about 2-3 days). When asked if nortriptyline was helpful when pt first started it, pt states he had different things going on and he therefore cannot compare now to then. Identifies having his wife close by as helpful to alleviating his anxiety. Per wife, prior to receiving Ativan last night, pt was very upset and near tears - states her presence helped but did  not do much for the pt. Per wife, Ativan was helpful for pt's anxiety last night after a while. Pt is taking gabapentin for neuropathy, so discussed possibility of using gabapentin to target anxiety. Also discussed starting a PRN for anxiety, such as Ativan, to which pt states he thinks he already has Ativan in place -- assured pt would check on this. States he has both Ativan and Klonopin available for anxiety at home. Denies using Ativan the week prior to hospitalization but endorses using Klonopin nightly (1 tablet), regardless of how he was feeling due to benefits to sleep. Endorses taking Nortriptyline daily. States he last had to take an earlier dose of Klonopin about a month ago, when he thought his 80 y/o daughter had cancer. Has had PTSD for about 22 years. Endorses nightmares but has never been on a medication for them. Uninterested in starting a medication for nightmares as they do not occur often (could be a whole year between nightmares). Denies previous psychiatric hospitalizations and denies any other psychiatric diagnoses.    When asked what could change to make him feel better during this hospitalization, pt states going home but he has received no estimated discharge date. States he wants to leave right now and staying a couple more days is not an option. When asked if he will think about actually leaving, pt states he has done it before and he has oxygen at home. When asked if he thinks he will stay another night or two, pt states he is unsure -- wife states pt is going to stay. Explained to pt concerns around him leaving right now (i.e. currently on IV antibiotics and oxygen support). Pt then acknowledges he could not walk down to his car right now, as he cannot even walk to the bathroom.     Pt states he wakes about every hour throughout the night and has not been receiving Klonopin this hospitalization. He does not expect to sleep well due to the uncomfortable bed, does not want extra sleep medication. Endorses confusion at times, such as asking his wife what day it is -- wife states that was right after pt came off of ventilator support and was on fentanyl. Denies mind playing tricks and AH/VH. Denies previously experiencing delirium. Oriented to date, month, year, unit, city, and hometown. On MOYB, pt initially says January in place of November, catches himself, starts over, and completes without error on second attempt. Endorses smoking (1.5-2 ppd) but not amenable to using nicotine patch/ lozenges, as they make his cravings worse. Denies substance use. .     ROS:   All systems reviewed as negative/unremarkable aside from the following pertinent positives and negatives: (+) anxiety, low mood, difficulty sleeping, intermittent confusion; (-) SI/plans/intents, AH/VH.    Collateral information:   - Reviewed medical records in Epic.  - Spoke with wife, [name], in person (8/3): Asks what the plans are to manage pt???s anxiety. Informed wife will plan to start PRN Ativan. Wife states pt was previously on hydroxyzine and it was unhelpful.     Psychiatric History:   Information collected from patient interview.  Prior psychiatric diagnoses: PTSD (~22 yrs), depression (more recent)  Psychiatric hospitalizations: denies  Substance abuse treatment: denies  Suicide attempts / Non-suicidal self-injury: once age 66 per chart, via overdose did not seek medical help  Medication trials/compliance: wellbutrin up to 300 mg daily (stopped d/t worsening depression/suicidal ideation), cymbalta up to 40 mg daily (stopped at admission), mirtazapine in the hospital (stopped d/t neutropenia).  Nortriptyline, clonazepam  Current OP psychiatric medication regimen: nortriptyline 75mg  daily increased July 2022, clonazepam 0.5mg  nightly (written as BID ubt takes nightly)  Current psychiatrist: [name] psych NP, [facility]  Current therapist: not currently seeing but recently had private therapist    Medical History:  Past Medical History:   Diagnosis Date   ??? 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 BONE MARROW BIOPSY & ASPIRATION  01/21/2020        ??? IR INSERT PORT AGE GREATER THAN 5 YRS  03/10/2020    IR INSERT PORT AGE GREATER THAN 5 YRS 03/10/2020 [name], MD IMG VIR H&V [facility]   ??? PR BRONCHOSCOPY,DIAGNOSTIC W LAVAGE Bilateral 07/20/2020    Procedure: BRONCHOSCOPY, RIGID OR FLEXIBLE, INCLUDE FLUOROSCOPIC GUIDANCE WHEN PERFORMED; W/BRONCHIAL ALVEOLAR LAVAGE WITH MODERATE SEDATION;  Surgeon: Dellis Filbert, MD;  Location: BRONCH PROCEDURE LAB [facility];  Service: Pulmonary     Medications:     Current Facility-Administered Medications:   ???  acetaminophen (TYLENOL) tablet 1,000 mg, 1,000 mg, Enteral tube: gastric , Q8H, Honey Monet Gildardo Cranker, ACNP, 1,000 mg at 11/04/20 0900  ???  aspirin chewable tablet 81 mg, 81 mg, Enteral tube: gastric , Daily, Honey Monet Gildardo Cranker, ACNP, 81 mg at 11/04/20 1610  ???  carvediloL (COREG) tablet 12.5 mg, 12.5 mg, Oral, BID, [name], MD, 12.5 mg at 11/04/20 9604  ???  DAPTOmycin (CUBICIN) 1,150 mg in sodium chloride (NS) 0.9 % 50 mL IVPB, 10 mg/kg, Intravenous, Q24H, Ria Comment, MD, Stopped at 11/03/20 1856  ???  gabapentin (NEURONTIN) capsule 300 mg, 300 mg, Enteral tube: gastric , TID, Honey Monet Gildardo Cranker, ACNP, 300 mg at 11/04/20 5409  ???  heparin (porcine) 5,000 unit/mL injection 5,000 Units, 5,000 Units, Subcutaneous, Q8H SCH, Bertram Gala, MD, 5,000 Units at 11/04/20 0556  ???  IP OKAY TO TREAT, , Other, Continuous PRN, Malva Cogan, MD  ???  ipratropium-albuteroL (DUO-NEB) 0.5-2.5 mg/3 mL nebulizer solution 3 mL, 3 mL, Nebulization, Q6H (RT), Bertram Gala, MD, 3 mL at 11/03/20 2119  ???  isavuconazonium sulfate (CRESEMBA) capsule 372 mg, 372 mg, Oral, Daily, Bertram Gala, MD, 372 mg at 11/04/20 0916  ???  lidocaine (LIDODERM) 5 % patch 1 patch, 1 patch, Transdermal, Daily, Amy Ladon Applebaum, [place], 1 patch at 11/03/20 1237  ???  linezolid in dextrose 5% (ZYVOX) 600 mg/300 mL IVPB 600 mg, 600 mg, Intravenous, Q12H SCH, Bertram Gala, MD, Stopped at 11/04/20 1017  ???  melatonin tablet 3 mg, 3 mg, Enteral tube: gastric , QPM, Honey Monet Gildardo Cranker, ACNP, 3 mg at 11/03/20 1700  ???  nortriptyline (PAMELOR) capsule 75 mg, 75 mg, Enteral tube: gastric , Nightly, Amy Ross Vota, PA, 75 mg at 11/03/20 2022  ???  oxyCODONE (ROXICODONE) immediate release tablet 20 mg, 20 mg, Oral, Q4H PRN, Amy Ross Vota, PA, 20 mg at 11/04/20 0916  ???  oxyCODONE (ROXICODONE) immediate release tablet 20 mg, 20 mg, Enteral tube: gastric , Q6H SCH, Amy Ross Vota, PA, 20 mg at 11/04/20 1150  ???  polyethylene glycol (MIRALAX) packet 17 g, 17 g, Enteral tube: gastric , BID, Honey Monet Gildardo Cranker, ACNP, 17 g at 11/03/20 8119  ???  predniSONE (DELTASONE) tablet 20 mg, 20 mg, Enteral tube: gastric , Daily, Amy Ross Vota, PA, 20 mg at 11/04/20 0900  ???  senna (SENOKOT) tablet 2 tablet, 2 tablet, Enteral tube: gastric , Nightly, Amy Ladon Applebaum, PA, 2 tablet at 11/03/20 2001/10/04  ???  sodium bicarbonate tablet 650 mg, 650 mg, Enteral tube: gastric , QID, Amy Ross Vota, PA, 650 mg at 11/04/20 1150  ???  sulfamethoxazole-trimethoprim (BACTRIM DS) 800-160 mg tablet 160 mg of trimethoprim, 1 tablet, Enteral tube: gastric , Once per day on 10/05/2022 Fri, Honey Monet Buckhorn, ACNP, 160 mg of trimethoprim at 11/04/20 3557  ???  valACYclovir (VALTREX) tablet 500 mg, 500 mg, Enteral tube: gastric , Daily, Honey Monet Gildardo Cranker, ACNP, 500 mg at 11/04/20 3220    Allergies:  Allergies   Allergen Reactions   ??? Bupropion Hcl Other (See Comments)     Per patient out of touch with reality, suicidal, homicidal   ??? Cefepime Rash     DRESS   ??? Ceftaroline Fosamil Rash and Other (See Comments)     Rash X 2 02/2020, suspected DRESS 06/2020   ??? 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 Onion Anaphylaxis   ??? Vancomycin Analogues      Hearing loss with Lasix  Other reaction(s): Other (See Comments)  Hearing loss  lasix   ??? Bismuth Subsalicylate Nausea And Vomiting   ??? Furosemide      With Vancomycin caused hearing loss  Other reaction(s): Other (See Comments)  With Vancomycin caused hearing loss     Social History:   Living situation: the patient lives with their family.  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  Alcohol use: Denies alcohol use while undergoing chemo but limits to one beer per day at other times.  Drug use: Denies drug use.     Family History:  The patient's family history is not on file.      Objective:   Vital signs:   Temp:  [36.7 ??C (98 ??F)-37 ??C (98.6 ??F)] 37 ??C (98.6 ??F)  Core Temp:  [36.6 ??C (97.88 ??F)-37.3 ??C (99.14 ??F)] 37.3 ??C (99.14 ??F)  Heart Rate:  [73-94] 87  SpO2 Pulse:  [73-94] 80  Resp:  [9-32] 17  BP: (167-171)/(88-105) 171/105  MAP (mmHg):  [122] 122  A BP-2: (145-189)/(78-104) 159/85  MAP:  [98 mmHg-130 mmHg] 108 mmHg  FiO2 (%):  [40 %-80 %] 75 %  SpO2:  [90 %-98 %] 95 %    Physical Exam:  Gen: No acute distress.  Pulm: Wearing high flow [place]  Neuro/MSK: Normal bulk/tone. Gait/station deferred given PNA/oxygen support.  Skin: normal skin tone.    Mental Status Exam:  Appearance:  appears stated age, clean/Neat, lying in bed and wearing high flow [place]. Wife present at bedside.    Attitude:   calm, cooperative and polite   Behavior/Psychomotor:  appropriate eye contact, no abnormal movements and no psychomotor agitation   Speech/Language:   normal rate, volume, tone, fluency and language intact, well formed   Mood:  ???depressed, very anxious, very down   Affect:  dysthymic and mood congruent   Thought process:  logical, linear, clear, coherent, goal directed   Thought content:    Denies thoughts of self-harm or SI/plans/intents. Explains he can understand why  some people choose to commit suicide, however, is adamant he has no intentions to; identifies his family as a protective factor. No expressed HI. No delusional thought content noted. Expresses recent anxiety and low/depressed mood, as well as a desire to go home.   Perceptual disturbances:   denies auditory and visual hallucinations and behavior not concerning for response to internal stimuli   Attention:  able to fully attend without fluctuations in consciousness and on months of the year backwards pt initially says Jan in place of Nov, but catches self, restarts, and completes without error on second attempt.   Concentration:  Able to fully concentrate and attend   Orientation:  Oriented to person, city, date, month, year, situation and hospital unit.   Memory:  not formally tested, but grossly intact   Fund of knowledge:   not formally assessed   Insight:    Fair   Judgment:   Fair   Impulse Control:  Intact     Data Reviewed:  I reviewed labs from the last 24 hours.     Additional Psychometric Testing:  Not applicable.

## 2020-11-04 NOTE — Unmapped (Signed)
Malignant Hematology Consult Note    Requesting Attending Physician :  [name], MD  Service Requesting Consult : Medical ICU (MDI)  Reason for Consult: B-ALL  Primary Oncologist: [name]    Assessment: Adam Keith is a 40 y.o. 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 Malignant hematology was consulted for B-ALL. Port removed on 11/04/20.     Blinatumomab is complete for this cycle. He can continue his home ponatinib today. IT triple therapy due for this cycle, which can be deferred while we are treating his infection. Will eventually need to reestablish central access for OPAT and future chemoimmunotherapy.    Recommendations:   -Appreciate excellent care of MICU team  -Appreciate ICID assistance in antibiosis and guidance on when to reestablish central access  -Blinatumomab completed this cycle  -Continue home Ponatinib 30mg  (home supply)  -PPx: valacyclovir, SMX-TMP 1DS tab m/w/f, isavuconazole  -Transfuse with leukoreduced blood products (irradiated preferred) for Hgb <7 and platelets <10    This patient has been staffed with Dr. Barbette Merino. These recommendations were discussed with the primary team.     Please contact the malignant hematology fellow at [phone] with any further questions.    Malignant Hematology will continue to follow.    Malva Cogan, MD  PGY-4, Hematology/Oncology Fellow  [facility]  Pager: [phone]    -------------------------------------------------------------    HPI:  Adam Keith is a 40 y.o. 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 Malignant hematology was consulted for B-ALL. Patient unable to provide history so history obtained from chart review.    He presented to ED on 7/30 with 2 days of SOB, right sided chest pain, productive cough and chills. Initially only required 2L but had worsening respiratory symptoms requiring Bipap and then emergent intubation in MICU on 7/31. Currently intubated and sedated.     He had similar admission in 07/2020 with SOB, increased sputum, fever, and CT chest finding concerning for fungal infection.  Additionally, bronchoscopy performed during that admission, positive for MRSA.  Followed by ID and treated with lenzolid and isavuconazole.  He had recent CT chest on 7/28 that was negative for new or persistent infection.    Patient presented with new onset SOB, productive cough, and chills as well as right sided chest pain. HE did not report any altered mental status or neurologic changes.  CXR in ED with RLL opacity concerning for pneumonia. His respiratory status declined and he was emergently intubated in MICU.  Febrile to 38.9.  WBC elevated to 38.7 with 90+% neutrophils. He has been found to have MRSA bacteremia and suspected MRSA PNA. He has been treated with Daptomycin (7/31 - p) and Linezolid (7/31 - p) with marked improvement and extubation on 8/2.     Interval HPI:  Remains afebrile, vitally stable with good MAPs. Has been tolerating PO well. Satting well but still requiring HFNC. Down in VIR for port removal this morning.       Review of Systems: Review of Systems - Unable to obtain as patient is sedated    Oncologic History:  Oncology History Overview Note   Referring/Local Oncologist: None    Diagnosis:Ph+ ALL    Genetics:    Karyotype/FISH:Abnormal Karyotype: 46,XY,t(9;22)(q34;q11.2)[1]/45,XY,der(7;9)(q10;q10)t(9;22)(q34;q11.2),der(22)t(9;22)[11]/46,sdl,+der(22)t(9;22)[5]/46,XY[3]     Abnormal FISH: A BCR/ABL1 interphase  FISH assay shows an abnormal signal pattern in 97% of the 100 cells scored. 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  The findings support a diagnosis of leukemia and have implications for targeted therapy and for monitoring residual disease.      Molecular Genetics:  BCR-ABL1 p210 transcripts were detected at a level of 46.479 IS% ratio in bone marrow.  BCR-ABL1 p190 transcripts were detected at a level of 4 in 100,000 cells in bone marrow.    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)     01/24/2020 - 04/02/2020 Chemotherapy    IP/OP LEUKEMIA GRAAPH-2005 + RITUXIMAB < 60 YO  rituximab hypercvad (odd and even course)     02/24/2020 Remission    CR with PCR MRD+; marrow showing 70% blasts with <1% blasts, negative flow MRD, BCR-ABL p210 PCR 0.197%; CNS negative for blasts     03/09/2020 Progression    CSF - rare blast ID'ed - IT chemo given    03/13/20 - IT chemo, CSF negative     04/16/2020 Biopsy    BM Bx with 40-50% cellularity, <1% blasts, MRD flow cytometry negative, BCR-ABL p210 transcripts 0.036%     05/28/2020 - 05/28/2020 Chemotherapy    OP AML - CNS THERAPY (INTRATHECAL CYTARABINE, INTRATHECAL METHOTREXATE, OR INTRATHECAL TRIPLE)  Select one of the following: cytarabine IT 100 mg with hydrocortisone 50 mg, cytarabine IT 40 mg with methotrexate 15 mg with hydrocortisone 50 mg, OR methotrexate IT 12 mg with hydrocortisone 50 mg     06/19/2020 -  Chemotherapy    IP/OP LEUKEMIA BLINATUMOMAB 7-DAY INFUSION (MINIMAL RESIDUAL DISEASE; WT >= 22 KG) (HOME INFUSION)      Cycles 1*-4: Blinatumomab 28 mcg/day Days 1-28 of 6-week cycle.  *Given in the inpatient setting on Days 1-3 on Cycle 1 and Days 1-2 on Cycle 2, while other treatment days are given in the outpatient setting.    Cycle 1 MRD Dosing     07/18/2020 Adverse Reaction    Hospitalization: fevers. Pneumonia 07/23/2020 Biopsy    Diagnosis  Bone marrow, right iliac, aspiration and biopsy  -   Normocellular bone marrow (50%) with trilineage hematopoiesis and 1% blasts by manual aspirate differential  -   Flow cytometry MRD analysis reveals no definitive immunophenotypic evidence of residual B lymphoblastic leukemia   - BCR-ABL p210 0.006%         08/10/2020 -  Chemotherapy    Cycle 2 blinatumomab-ponatinib  Ponatinib 30mg     1 IT per cycle     09/21/2020 Adverse Reaction    Covid-19, symptomatic but not requiring hospitalization.     ALL (acute lymphoblastic leukemia) (CMS-HCC)   06/11/2020 -  Chemotherapy    IP/OP LEUKEMIA BLINATUMOMAB 7-DAY INFUSION (MINIMAL RESIDUAL DISEASE; WT >= 22 KG) (HOME INFUSION)  Cycles 1*-4: Blinatumomab 28 mcg/day Days 1-28 of 6-week cycle.  *Given in the inpatient setting on Days 1-3 on Cycle 1 and Days 1-2 on Cycle 2, while other treatment days are given in the outpatient setting.     09/21/2020 Initial Diagnosis    ALL (acute lymphoblastic leukemia) (CMS-HCC)         Medical, Surgical, Social,  and Family History reviewed and updated.    Social History     Social History Narrative   ??? Not on file       Allergies: is allergic to bupropion hcl, cefepime, ceftaroline fosamil, dapsone, onion, vancomycin analogues, bismuth subsalicylate, and furosemide.    Medications:   Meds:  ??? acetaminophen  1,000 mg Enteral tube: gastric  Q8H   ??? aspirin  81 mg Enteral tube: gastric  Daily   ??? carvediloL  12.5 mg Oral BID   ??? DAPTOmycin  10 mg/kg Intravenous Q24H   ??? gabapentin  300 mg Enteral tube: gastric  TID   ??? heparin (porcine) for subcutaneous use  5,000 Units Subcutaneous Q8H [facility]   ??? ipratropium-albuteroL  3 mL Nebulization Q6H (RT)   ??? isavuconazonium sulfate  372 mg Oral Daily   ??? lidocaine  1 patch Transdermal Daily   ??? linezolid  600 mg Intravenous Q12H [facility]   ??? melatonin  3 mg Enteral tube: gastric  QPM   ??? nortriptyline  75 mg Enteral tube: gastric  Nightly   ??? oxyCODONE  20 mg Enteral tube: gastric Q6H [facility]   ??? polyethylene glycol  17 g Enteral tube: gastric  BID   ??? predniSONE  20 mg Enteral tube: gastric  Daily   ??? senna  2 tablet Enteral tube: gastric  Nightly   ??? sodium bicarbonate  650 mg Enteral tube: gastric  QID   ??? sulfamethoxazole-trimethoprim  1 tablet Enteral tube: gastric  Once per day on Mon Wed Fri   ??? valACYclovir  500 mg Enteral tube: gastric  Daily     Continuous Infusions:  ??? IP okay to treat       PRN Meds:.IP okay to treat, oxyCODONE    Objective:   Vitals: Temp:  [36.7 ??C (98 ??F)-37 ??C (98.6 ??F)] 37 ??C (98.6 ??F)  Core Temp:  [36.6 ??C (97.88 ??F)-37.3 ??C (99.14 ??F)] 37.3 ??C (99.14 ??F)  Heart Rate:  [73-94] 87  SpO2 Pulse:  [73-94] 80  Resp:  [9-32] 17  BP: (167-171)/(88-105) 171/105  MAP (mmHg):  [122] 122  A BP-2: (145-189)/(78-104) 159/85  MAP:  [98 mmHg-130 mmHg] 108 mmHg  FiO2 (%):  [40 %-80 %] 75 %  SpO2:  [90 %-98 %] 95 %    Physical Exam:     GEN: well developed, well nourished, in no distress  PSYCH: full and appropriate range of affect with good insight and judgement  HEENT: NCAT, pupils equal, sclerae anicteric  LYMPH: no cervical, supraclavicular, periumbilical adenopathy  PULM: HFNC in place, no increased WOB  COR: rrr, no m/r/g, no LE edema  GI: bowel sounds present, abdomen soft, nontender, nondistended, no palpable hepatosplenomegaly   EXT: warm and well perfused, no edema  SKIN: No rashes, right upper chest port removed, bandaging in place        Test Results  Recent Labs     11/02/20  0510 11/02/20  0827 11/03/20  0506 11/03/20  1524 11/04/20  0418   WBC 22.3*  --  13.1*  --  12.1*   NEUTROABS 20.0*  --  12.5*  --  10.5*   HGB 11.3*   < > 9.9* 11.0* 10.1*   PLT 192  --  173  --  191    < > = values in this interval not displayed.       Imaging: Radiology studies were personally reviewed

## 2020-11-05 LAB — CBC W/ AUTO DIFF
BASOPHILS ABSOLUTE COUNT: 0.1 10*9/L (ref 0.0–0.1)
BASOPHILS RELATIVE PERCENT: 0.6 %
EOSINOPHILS ABSOLUTE COUNT: 0.6 10*9/L — ABNORMAL HIGH (ref 0.0–0.5)
EOSINOPHILS RELATIVE PERCENT: 6.3 %
HEMATOCRIT: 28.7 % — ABNORMAL LOW (ref 39.0–48.0)
HEMOGLOBIN: 9.9 g/dL — ABNORMAL LOW (ref 12.9–16.5)
LYMPHOCYTES ABSOLUTE COUNT: 0.6 10*9/L — ABNORMAL LOW (ref 1.1–3.6)
LYMPHOCYTES RELATIVE PERCENT: 7.3 %
MEAN CORPUSCULAR HEMOGLOBIN CONC: 34.7 g/dL (ref 32.0–36.0)
MEAN CORPUSCULAR HEMOGLOBIN: 33 pg — ABNORMAL HIGH (ref 25.9–32.4)
MEAN CORPUSCULAR VOLUME: 95.2 fL (ref 77.6–95.7)
MEAN PLATELET VOLUME: 8.1 fL (ref 6.8–10.7)
MONOCYTES ABSOLUTE COUNT: 0.4 10*9/L (ref 0.3–0.8)
MONOCYTES RELATIVE PERCENT: 4.5 %
NEUTROPHILS ABSOLUTE COUNT: 7.1 10*9/L (ref 1.8–7.8)
NEUTROPHILS RELATIVE PERCENT: 81.3 %
PLATELET COUNT: 188 10*9/L (ref 150–450)
RED BLOOD CELL COUNT: 3.01 10*12/L — ABNORMAL LOW (ref 4.26–5.60)
RED CELL DISTRIBUTION WIDTH: 16.3 % — ABNORMAL HIGH (ref 12.2–15.2)
WBC ADJUSTED: 8.8 10*9/L (ref 3.6–11.2)

## 2020-11-05 LAB — COMPREHENSIVE METABOLIC PANEL
ALBUMIN: 2.4 g/dL — ABNORMAL LOW (ref 3.4–5.0)
ALKALINE PHOSPHATASE: 110 U/L (ref 46–116)
ALT (SGPT): 28 U/L (ref 10–49)
ANION GAP: 4 mmol/L — ABNORMAL LOW (ref 5–14)
AST (SGOT): 18 U/L (ref <=34)
BILIRUBIN TOTAL: 0.2 mg/dL — ABNORMAL LOW (ref 0.3–1.2)
BLOOD UREA NITROGEN: 27 mg/dL — ABNORMAL HIGH (ref 9–23)
BUN / CREAT RATIO: 17
CALCIUM: 8.7 mg/dL (ref 8.7–10.4)
CHLORIDE: 104 mmol/L (ref 98–107)
CO2: 28 mmol/L (ref 20.0–31.0)
CREATININE: 1.63 mg/dL — ABNORMAL HIGH
EGFR CKD-EPI (2021) MALE: 54 mL/min/{1.73_m2} — ABNORMAL LOW (ref >=60)
GLUCOSE RANDOM: 99 mg/dL (ref 70–179)
POTASSIUM: 3.6 mmol/L (ref 3.4–4.8)
PROTEIN TOTAL: 5.9 g/dL (ref 5.7–8.2)
SODIUM: 136 mmol/L (ref 135–145)

## 2020-11-05 LAB — HISTOPLASMA ANTIGEN, URINE
HISTOPLASMA AG, URINE RESULT: NOT DETECTED
HISTOPLASMA AG, URINE VALUE: NOT DETECTED ng/mL

## 2020-11-05 LAB — MAGNESIUM: MAGNESIUM: 1.9 mg/dL (ref 1.6–2.6)

## 2020-11-05 LAB — PHOSPHORUS: PHOSPHORUS: 3.4 mg/dL (ref 2.4–5.1)

## 2020-11-05 MED ADMIN — sodium bicarbonate tablet 650 mg: 650 mg | GASTROENTERAL | @ 11:00:00 | Stop: 2020-11-05

## 2020-11-05 MED ADMIN — amLODIPine (NORVASC) tablet 10 mg: 10 mg | ORAL | @ 12:00:00

## 2020-11-05 MED ADMIN — ondansetron (ZOFRAN) 4 mg/2 mL injection: INTRAVENOUS | @ 23:00:00 | Stop: 2020-11-05

## 2020-11-05 MED ADMIN — zinc sulfate (ZINCATE) capsule 220 mg: 220 mg | ORAL | @ 21:00:00 | Stop: 2020-11-19

## 2020-11-05 MED ADMIN — heparin (porcine) 5,000 unit/mL injection 5,000 Units: 5000 [IU] | SUBCUTANEOUS | @ 11:00:00

## 2020-11-05 MED ADMIN — ondansetron (ZOFRAN) injection 4 mg: 4 mg | INTRAVENOUS | @ 23:00:00

## 2020-11-05 MED ADMIN — carvediloL (COREG) tablet 12.5 mg: 12.5 mg | ORAL | @ 01:00:00

## 2020-11-05 MED ADMIN — amLODIPine (NORVASC) tablet 10 mg: 10 mg | ORAL | @ 02:00:00

## 2020-11-05 MED ADMIN — potassium chloride (KLOR-CON) CR tablet 40 mEq: 40 meq | ORAL | @ 12:00:00 | Stop: 2020-11-05

## 2020-11-05 MED ADMIN — aspirin chewable tablet 81 mg: 81 mg | GASTROENTERAL | @ 12:00:00

## 2020-11-05 MED ADMIN — acetaminophen (TYLENOL) tablet 1,000 mg: 1000 mg | GASTROENTERAL | @ 04:00:00 | Stop: 2020-12-01

## 2020-11-05 MED ADMIN — acetaminophen (TYLENOL) tablet 1,000 mg: 1000 mg | GASTROENTERAL | @ 21:00:00 | Stop: 2020-12-01

## 2020-11-05 MED ADMIN — oxyCODONE (ROXICODONE) immediate release tablet 20 mg: 20 mg | GASTROENTERAL | @ 04:00:00 | Stop: 2020-11-17

## 2020-11-05 MED ADMIN — oxyCODONE (ROXICODONE) immediate release tablet 20 mg: 20 mg | GASTROENTERAL | @ 16:00:00 | Stop: 2020-11-17

## 2020-11-05 MED ADMIN — heparin (porcine) 5,000 unit/mL injection 5,000 Units: 5000 [IU] | SUBCUTANEOUS | @ 18:00:00

## 2020-11-05 MED ADMIN — ipratropium-albuteroL (DUO-NEB) 0.5-2.5 mg/3 mL nebulizer solution 3 mL: 3 mL | RESPIRATORY_TRACT | @ 07:00:00

## 2020-11-05 MED ADMIN — heparin (porcine) 5,000 unit/mL injection 5,000 Units: 5000 [IU] | SUBCUTANEOUS | @ 01:00:00

## 2020-11-05 MED ADMIN — oxyCODONE (ROXICODONE) immediate release tablet 20 mg: 20 mg | GASTROENTERAL | @ 21:00:00 | Stop: 2020-11-17

## 2020-11-05 MED ADMIN — LORazepam (ATIVAN) tablet 0.5 mg: .5 mg | ORAL

## 2020-11-05 MED ADMIN — sulfamethoxazole-trimethoprim (BACTRIM DS) 800-160 mg tablet 160 mg of trimethoprim: 1 | GASTROENTERAL | @ 12:00:00 | Stop: 2020-12-02

## 2020-11-05 MED ADMIN — linezolid in dextrose 5% (ZYVOX) 600 mg/300 mL IVPB 600 mg: 600 mg | INTRAVENOUS | @ 12:00:00 | Stop: 2020-11-11

## 2020-11-05 MED ADMIN — valACYclovir (VALTREX) tablet 500 mg: 500 mg | GASTROENTERAL | @ 12:00:00

## 2020-11-05 MED ADMIN — ipratropium-albuteroL (DUO-NEB) 0.5-2.5 mg/3 mL nebulizer solution 3 mL: 3 mL | RESPIRATORY_TRACT | @ 02:00:00

## 2020-11-05 MED ADMIN — PONATinib (ICLUSIG) tablet 30 mg **PATIENT SUPPLIED**: 30 mg | ORAL | @ 21:00:00

## 2020-11-05 MED ADMIN — DAPTOmycin (CUBICIN) 1,150 mg in sodium chloride (NS) 0.9 % 50 mL IVPB: 10 mg/kg | INTRAVENOUS | @ 22:00:00 | Stop: 2020-11-15

## 2020-11-05 MED ADMIN — predniSONE (DELTASONE) tablet 20 mg: 20 mg | GASTROENTERAL | @ 12:00:00

## 2020-11-05 MED ADMIN — carvediloL (COREG) tablet 12.5 mg: 12.5 mg | ORAL | @ 12:00:00

## 2020-11-05 MED ADMIN — magnesium oxide (MAG-OX) tablet 800 mg: 800 mg | GASTROENTERAL | @ 14:00:00 | Stop: 2020-11-05

## 2020-11-05 MED ADMIN — ipratropium-albuteroL (DUO-NEB) 0.5-2.5 mg/3 mL nebulizer solution 3 mL: 3 mL | RESPIRATORY_TRACT | @ 14:00:00

## 2020-11-05 MED ADMIN — nortriptyline (PAMELOR) capsule 75 mg: 75 mg | GASTROENTERAL | @ 01:00:00

## 2020-11-05 MED ADMIN — gabapentin (NEURONTIN) capsule 300 mg: 300 mg | GASTROENTERAL | @ 12:00:00

## 2020-11-05 MED ADMIN — linezolid in dextrose 5% (ZYVOX) 600 mg/300 mL IVPB 600 mg: 600 mg | INTRAVENOUS | @ 01:00:00 | Stop: 2020-11-11

## 2020-11-05 MED ADMIN — gabapentin (NEURONTIN) capsule 300 mg: 300 mg | GASTROENTERAL | @ 18:00:00

## 2020-11-05 MED ADMIN — isavuconazonium sulfate (CRESEMBA) capsule 372 mg: 372 mg | ORAL | @ 12:00:00

## 2020-11-05 MED ADMIN — oxyCODONE (ROXICODONE) immediate release tablet 20 mg: 20 mg | GASTROENTERAL | @ 11:00:00 | Stop: 2020-11-17

## 2020-11-05 MED ADMIN — oxyCODONE (ROXICODONE) immediate release tablet 20 mg: 20 mg | ORAL | @ 12:00:00 | Stop: 2020-11-17

## 2020-11-05 MED ADMIN — acetaminophen (TYLENOL) tablet 1,000 mg: 1000 mg | GASTROENTERAL | @ 12:00:00 | Stop: 2020-12-01

## 2020-11-05 MED ADMIN — oxyCODONE (ROXICODONE) immediate release tablet 20 mg: 20 mg | ORAL | @ 18:00:00 | Stop: 2020-11-17

## 2020-11-05 MED ADMIN — ipratropium-albuteroL (DUO-NEB) 0.5-2.5 mg/3 mL nebulizer solution 3 mL: 3 mL | RESPIRATORY_TRACT | @ 21:00:00

## 2020-11-05 MED ADMIN — senna (SENOKOT) tablet 2 tablet: 2 | GASTROENTERAL | @ 01:00:00

## 2020-11-05 MED ADMIN — gabapentin (NEURONTIN) capsule 300 mg: 300 mg | GASTROENTERAL | @ 01:00:00

## 2020-11-05 MED ADMIN — LORazepam (ATIVAN) tablet 0.5 mg: .5 mg | ORAL | @ 03:00:00

## 2020-11-05 MED ADMIN — sodium bicarbonate tablet 650 mg: 650 mg | GASTROENTERAL | @ 01:00:00

## 2020-11-05 NOTE — Unmapped (Signed)
Malignant Hematology Consult Note    Requesting Attending Physician :  [name], MD  Service Requesting Consult : Medical ICU (MDI)  Reason for Consult: B-ALL  Primary Oncologist: [name]    Assessment: Adam Keith is a 40 y.o. 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 Malignant hematology was consulted for B-ALL. Port removed on 11/04/20. His blinatumomab is complete for the cycle and he has been resumed on home ponatinib on 8/3 without issue. Will eventually need to reestablish central access for OPAT and future chemoimmunotherapy. Would appreciate PICC placement sometime prior to discharge (non-urgent).    Recommendations:   -Appreciate excellent care of MICU team  -Reestablish central access when possible per ICID  -Continue home Ponatinib 30mg  (home supply)  -PPx: valacyclovir, SMX-TMP 1DS tab m/w/f, isavuconazole  -Transfuse with leukoreduced blood products (irradiated preferred) for Hgb <7 and platelets <10  -Follow up to be coordinated closer to discharge.     This patient has been staffed with Dr. Barbette Merino. These recommendations were discussed with the primary team.     Malignant Hematology will sign off. Please let us know when patient is ready for discharge to coordinate follow up or if you have any further oncology-related questions.     Malva Cogan, MD  PGY-4, Hematology/Oncology Fellow  [facility]  Pager: [phone]    -------------------------------------------------------------    HPI:  Adam Keith is a 40 y.o. 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 Malignant hematology was consulted for B-ALL. Patient unable to provide history so history obtained from chart review.    He presented to ED on 7/30 with 2 days of SOB, right sided chest pain, productive cough and chills. Initially only required 2L but had worsening respiratory symptoms requiring Bipap and then emergent intubation in MICU on 7/31. Currently intubated and sedated.     He had similar admission in 07/2020 with SOB, increased sputum, fever, and CT chest finding concerning for fungal infection.  Additionally, bronchoscopy performed during that admission, positive for MRSA.  Followed by ID and treated with lenzolid and isavuconazole.  He had recent CT chest on 7/28 that was negative for new or persistent infection.    Patient presented with new onset SOB, productive cough, and chills as well as right sided chest pain. HE did not report any altered mental status or neurologic changes.  CXR in ED with RLL opacity concerning for pneumonia. His respiratory status declined and he was emergently intubated in MICU.  Febrile to 38.9.  WBC elevated to 38.7 with 90+% neutrophils. He has been found to have MRSA bacteremia and suspected MRSA PNA. He has been treated with Daptomycin (7/31 - p) and Linezolid (7/31 - p) with marked improvement and extubation on 8/2.     Interval HPI:  Remains afebrile and vitally stable. Remains on HFNC recently bumped up to 60-70%FiO2 (50L) No new concerns today per patient, subjectively feeling fine.     Review of Systems: Review of Systems - negative unless specified above    Oncologic History:  Oncology History Overview Note   Referring/Local Oncologist: None    Diagnosis:Ph+ ALL    Genetics:    Karyotype/FISH:Abnormal Karyotype: 46,XY,t(9;22)(q34;q11.2)[1]/45,XY,der(7;9)(q10;q10)t(9;22)(q34;q11.2),der(22)t(9;22)[11]/46,sdl,+der(22)t(9;22)[5]/46,XY[3]     Abnormal FISH: A BCR/ABL1 interphase FISH assay shows an abnormal signal pattern in  97% of the 100 cells scored. 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  The findings support a diagnosis of leukemia and have implications for targeted therapy and for monitoring residual disease.      Molecular Genetics:  BCR-ABL1 p210 transcripts were detected at a level of 46.479 IS% ratio in bone marrow.  BCR-ABL1 p190 transcripts were detected at a level of 4 in 100,000 cells in bone marrow.    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)     01/24/2020 - 04/02/2020 Chemotherapy    IP/OP LEUKEMIA GRAAPH-2005 + RITUXIMAB < 60 YO  rituximab hypercvad (odd and even course)     02/24/2020 Remission    CR with PCR MRD+; marrow showing 70% blasts with <1% blasts, negative flow MRD, BCR-ABL p210 PCR 0.197%; CNS negative for blasts     03/09/2020 Progression    CSF - rare blast ID'ed - IT chemo given    03/13/20 - IT chemo, CSF negative     04/16/2020 Biopsy    BM Bx with 40-50% cellularity, <1% blasts, MRD flow cytometry negative, BCR-ABL p210 transcripts 0.036%     05/28/2020 - 05/28/2020 Chemotherapy    OP AML - CNS THERAPY (INTRATHECAL CYTARABINE, INTRATHECAL METHOTREXATE, OR INTRATHECAL TRIPLE)  Select one of the following: cytarabine IT 100 mg with hydrocortisone 50 mg, cytarabine IT 40 mg with methotrexate 15 mg with hydrocortisone 50 mg, OR methotrexate IT 12 mg with hydrocortisone 50 mg     06/19/2020 -  Chemotherapy    IP/OP LEUKEMIA BLINATUMOMAB 7-DAY INFUSION (MINIMAL RESIDUAL DISEASE; WT >= 22 KG) (HOME INFUSION)      Cycles 1*-4: Blinatumomab 28 mcg/day Days 1-28 of 6-week cycle.  *Given in the inpatient setting on Days 1-3 on Cycle 1 and Days 1-2 on Cycle 2, while other treatment days are given in the outpatient setting.    Cycle 1 MRD Dosing     07/18/2020 Adverse Reaction    Hospitalization: fevers. Pneumonia     07/23/2020 Biopsy Diagnosis  Bone marrow, right iliac, aspiration and biopsy  -   Normocellular bone marrow (50%) with trilineage hematopoiesis and 1% blasts by manual aspirate differential  -   Flow cytometry MRD analysis reveals no definitive immunophenotypic evidence of residual B lymphoblastic leukemia   - BCR-ABL p210 0.006%         08/10/2020 -  Chemotherapy    Cycle 2 blinatumomab-ponatinib  Ponatinib 30mg     1 IT per cycle     09/21/2020 Adverse Reaction    Covid-19, symptomatic but not requiring hospitalization.     ALL (acute lymphoblastic leukemia) (CMS-HCC)   06/11/2020 -  Chemotherapy    IP/OP LEUKEMIA BLINATUMOMAB 7-DAY INFUSION (MINIMAL RESIDUAL DISEASE; WT >= 22 KG) (HOME INFUSION)  Cycles 1*-4: Blinatumomab 28 mcg/day Days 1-28 of 6-week cycle.  *Given in the inpatient setting on Days 1-3 on Cycle 1 and Days 1-2 on Cycle 2, while other treatment days are given in the outpatient setting.     09/21/2020 Initial Diagnosis    ALL (acute lymphoblastic leukemia) (CMS-HCC)         Medical, Surgical, Social, and Family History reviewed and updated.  Social History     Social History Narrative   ??? Not on file       Allergies: is allergic to bupropion hcl, cefepime, ceftaroline fosamil, dapsone, onion, vancomycin analogues, bismuth subsalicylate, and furosemide.    Medications:   Meds:  ??? acetaminophen  1,000 mg Enteral tube: gastric  Q8H   ??? amLODIPine  10 mg Oral Daily   ??? aspirin  81 mg Enteral tube: gastric  Daily   ??? carvediloL  12.5 mg Oral BID   ??? DAPTOmycin  10 mg/kg Intravenous Q24H   ??? gabapentin  300 mg Enteral tube: gastric  TID   ??? heparin (porcine) for subcutaneous use  5,000 Units Subcutaneous Q8H [facility]   ??? ipratropium-albuteroL  3 mL Nebulization Q6H (RT)   ??? isavuconazonium sulfate  372 mg Oral Daily   ??? lidocaine  1 patch Transdermal Daily   ??? linezolid  600 mg Intravenous Q12H [facility]   ??? melatonin  3 mg Enteral tube: gastric  QPM   ??? nortriptyline  75 mg Enteral tube: gastric  Nightly   ??? oxyCODONE  20 mg Enteral tube: gastric  Q6H [facility]   ??? polyethylene glycol  17 g Enteral tube: gastric  BID   ??? PONATinib  30 mg Oral Q24H   ??? predniSONE  20 mg Enteral tube: gastric  Daily   ??? senna  2 tablet Enteral tube: gastric  Nightly   ??? sulfamethoxazole-trimethoprim  1 tablet Enteral tube: gastric  Once per day on Mon Wed Fri   ??? valACYclovir  500 mg Enteral tube: gastric  Daily     Continuous Infusions:  ??? IP okay to treat       PRN Meds:.IP okay to treat, LORazepam, oxyCODONE    Objective:   Vitals: Temp:  [36.5 ??C (97.7 ??F)-36.6 ??C (97.9 ??F)] 36.5 ??C (97.7 ??F)  Core Temp:  [36.7 ??C (98.06 ??F)-37.4 ??C (99.32 ??F)] 37.3 ??C (99.14 ??F)  Heart Rate:  [67-88] 67  SpO2 Pulse:  [66-88] 67  Resp:  [11-24] 17  BP: (126-138)/(70-81) 133/78  MAP (mmHg):  [88-94] 94  A BP-2: (151-199)/(80-101) 172/90  MAP:  [103 mmHg-131 mmHg] 114 mmHg  FiO2 (%):  [50 %-70 %] 60 %  SpO2:  [86 %-99 %] 95 %    Physical Exam:     GEN: well developed, well nourished, in no distress  PSYCH: full and appropriate range of affect with good insight and judgement  HEENT: NCAT, pupils equal, sclerae anicteric  LYMPH: deferred  PULM: HFNC in place, no increased WOB today  COR: rrr, no m/r/g  GI: deferred   EXT: No edema  SKIN: No rashes, right upper chest port removed, bandaging in place        Test Results  Recent Labs     11/03/20  0506 11/03/20  1524 11/04/20  0418 11/05/20  0415   WBC 13.1*  --  12.1* 8.8   NEUTROABS 12.5*  --  10.5* 7.1   HGB 9.9* 11.0* 10.1* 9.9*   PLT 173  --  191 188       Imaging: Radiology studies were personally reviewed

## 2020-11-05 NOTE — Unmapped (Signed)
This shift, arterial line and foley catheter discontinued per order. Patient remains on HFNC, plan to wean FiO2 as patient tolerates.      Problem: Adult Inpatient Plan of Care  Goal: Plan of Care Review  11/05/2020 1606 by Bertrum Sol, RN BSN  Outcome: Progressing  11/05/2020 1606 by Bertrum Sol, RN BSN  Outcome: Ongoing - Unchanged  Goal: Patient-Specific Goal (Individualized)  11/05/2020 1606 by Bertrum Sol, RN BSN  Outcome: Progressing  11/05/2020 1606 by Bertrum Sol, RN BSN  Outcome: Ongoing - Unchanged  Goal: Absence of Hospital-Acquired Illness or Injury  11/05/2020 1606 by Bertrum Sol, RN BSN  Outcome: Progressing  11/05/2020 1606 by Bertrum Sol, RN BSN  Outcome: Ongoing - Unchanged  Intervention: Identify and Manage Fall Risk  Recent Flowsheet Documentation  Taken 11/05/2020 0800 by Bertrum Sol, RN BSN  Safety Interventions:   aspiration precautions   bleeding precautions   fall reduction program maintained   isolation precautions   lighting adjusted for tasks/safety   low bed   infection management   neutropenic precautions  Intervention: Prevent Skin Injury  Recent Flowsheet Documentation  Taken 11/05/2020 1600 by Bertrum Sol, RN BSN  Skin Protection:   adhesive use limited   cleansing with dimethicone incontinence wipes   incontinence pads utilized   pulse oximeter probe site changed   silicone foam dressing in place   tubing/devices free from skin contact   zinc oxide barrier cream   transparent dressing maintained  Taken 11/05/2020 1200 by Bertrum Sol, RN BSN  Skin Protection:   adhesive use limited   cleansing with dimethicone incontinence wipes   incontinence pads utilized   transparent dressing maintained   tubing/devices free from skin contact   zinc oxide barrier cream  Taken 11/05/2020 0800 by Bertrum Sol, RN BSN  Skin Protection:   adhesive use limited   cleansing with dimethicone incontinence wipes   incontinence pads utilized   pulse oximeter probe site changed   silicone foam dressing in place   transparent dressing maintained   tubing/devices free from skin contact   zinc oxide barrier cream  Intervention: Prevent and Manage VTE (Venous Thromboembolism) Risk  Recent Flowsheet Documentation  Taken 11/05/2020 1600 by Bertrum Sol, RN BSN  Activity Management: activity adjusted per tolerance  VTE Prevention/Management: anticoagulant therapy  Taken 11/05/2020 1400 by Bertrum Sol, RN BSN  Activity Management: activity adjusted per tolerance  Taken 11/05/2020 1200 by Bertrum Sol, RN BSN  Activity Management: activity adjusted per tolerance  Taken 11/05/2020 1000 by Bertrum Sol, RN BSN  Activity Management: activity adjusted per tolerance  Taken 11/05/2020 0800 by Bertrum Sol, RN BSN  Activity Management: activity adjusted per tolerance  Intervention: Prevent Infection  Recent Flowsheet Documentation  Taken 11/05/2020 0800 by Bertrum Sol, RN BSN  Infection Prevention:   cohorting utilized   environmental surveillance performed   hand hygiene promoted   personal protective equipment utilized   equipment surfaces disinfected   rest/sleep promoted   single patient room provided   visitors restricted/screened  Goal: Optimal Comfort and Wellbeing  11/05/2020 1606 by Bertrum Sol, RN BSN  Outcome: Progressing  11/05/2020 1606 by Bertrum Sol, RN BSN  Outcome: Ongoing - Unchanged  Goal: Readiness for Transition of Care  11/05/2020 1606 by Bertrum Sol, RN BSN  Outcome: Progressing  11/05/2020 1606 by Bertrum Sol, RN BSN  Outcome: Ongoing - Unchanged  Goal:  Rounds/Family Conference  11/05/2020 1606 by Bertrum Sol, RN BSN  Outcome: Progressing  11/05/2020 1606 by Bertrum Sol, RN BSN  Outcome: Ongoing - Unchanged     Problem: Respiratory Compromise (Pneumonia)  Goal: Effective Oxygenation and Ventilation  11/05/2020 1606 by Bertrum Sol, RN BSN  Outcome: Progressing  11/05/2020 1606 by Bertrum Sol, RN BSN  Outcome: Ongoing - Unchanged  Intervention: Promote Airway Secretion Clearance  Recent Flowsheet Documentation  Taken 11/05/2020 1600 by Bertrum Sol, RN BSN  Cough And Deep Breathing: done independently per patient  Taken 11/05/2020 1200 by Bertrum Sol, RN BSN  Cough And Deep Breathing: done independently per patient  Taken 11/05/2020 0800 by Bertrum Sol, RN BSN  Cough And Deep Breathing: done independently per patient  Intervention: Optimize Oxygenation and Ventilation  Recent Flowsheet Documentation  Taken 11/05/2020 1600 by Bertrum Sol, RN BSN  Head of Bed [facility]) Positioning: HOB at 30-45 degrees  Airway/Ventilation Management:   airway patency maintained   calming measures promoted   pulmonary hygiene promoted   humidification applied  Taken 11/05/2020 1400 by Bertrum Sol, RN BSN  Head of Bed [facility]) Positioning: HOB at 30-45 degrees  Taken 11/05/2020 1200 by Bertrum Sol, RN BSN  Head of Bed [facility]) Positioning: HOB at 30-45 degrees  Airway/Ventilation Management:   airway patency maintained   calming measures promoted   humidification applied   pulmonary hygiene promoted  Taken 11/05/2020 1000 by Bertrum Sol, RN BSN  Head of Bed [facility]) Positioning: HOB at 30-45 degrees  Taken 11/05/2020 0800 by Bertrum Sol, RN BSN  Head of Bed [facility]) Positioning: HOB at 30-45 degrees  Airway/Ventilation Management:   airway patency maintained   calming measures promoted   humidification applied   pulmonary hygiene promoted     Problem: Self-Care Deficit  Goal: Improved Ability to Complete Activities of Daily Living  11/05/2020 1606 by Bertrum Sol, RN BSN  Outcome: Progressing  11/05/2020 1606 by Bertrum Sol, RN BSN  Outcome: Ongoing - Unchanged  Intervention: Promote Activity and Functional Independence  Recent Flowsheet Documentation  Taken 11/05/2020 1600 by Bertrum Sol, RN BSN  Self-Care Promotion:   independence encouraged   BADL personal objects within reach  Taken 11/05/2020 1200 by Bertrum Sol, RN BSN  Self-Care Promotion:   independence encouraged   BADL personal objects within reach  Taken 11/05/2020 0800 by Bertrum Sol, RN BSN  Self-Care Promotion:   independence encouraged   BADL personal objects within reach

## 2020-11-05 NOTE — Unmapped (Signed)
MICU Evening Summary     Date of Service: 11/05/2020    Interval History: Adam 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 He presented 7/31 to ED w two days of SOB and tachycardia c/f sepsis w new RLL consolidation (recent CT negative 3d pta). Critical care services are indicated for:    Principal Problem:    Pneumonia  Active Problems:    Tobacco use disorder    AKI (acute kidney injury) (CMS-HCC)    MRSA bacteremia    Recurrent major depressive disorder, in partial remission (CMS-HCC)    Immunocompromised (CMS-HCC)    Hypogammaglobulinemia (CMS-HCC)    ALL (acute lymphoblastic leukemia) (CMS-HCC)    Respiratory failure with hypoxia (CMS-HCC)    Chronic pain    Acute kidney injury superimposed on CKD (CMS-HCC)    COPD (chronic obstructive pulmonary disease) (CMS-HCC)  Resolved Problems:    * No resolved hospital problems. *        Assessment & Plan     N: cont home gaba, elavil, PRN klonopin and oxy, baseline deaf Rt ear  P: HFNC/CPAP at night.   CV: normotensive, echo wnl  GI: regular diet  R: Cr improved after IVF, on pred 20mg  for DRESS  H: heparin ppx, pvl neg,   E: accu checks  Onc: PO chemo restarted. Will need to discuss with oncology regarding ponatinib-binatumomab infusion which was stopped 7/31   ID: ppx w cresemba, bactrim, valtrex, cont linezolid, mero, mica >> changed mero to cefepime and added azithr per ICID recs 7/31 >> off cefepime on 8/2 still on zyvox, LRCx with MRSA otherwise cultures negative    Rosary Lively, MD  Pulmonary and Critical Care Fellow  Personal Pager: [phone] - 24-hr consult pager: [phone]        Critical Care Attestation     This patient is critically ill or injured with the impairment of vital organ systems such that there is a high probability of imminent or life threatening deterioration in the patient's condition. This patient must remain in the ICU for ongoing evaluation of the comprehensive management plan outlined in this note. I directly provided critical care services as documented in this note and the critical care time spent (40  min) is exclusive of separately billable procedures.  In addition to time spent for critical care management, I also provided advance care planning services for 0  minutes (see ACP note for details)???. Total billable critical care time 40  minutes.

## 2020-11-05 NOTE — Unmapped (Signed)
Patient transported to and from VIR on CPAP of 10, 50% without incidence.    Patient placed on HFNC at documented settings. FIO2 and Liter flow weaned this shift. Non- rebreather at bedside for recovery is needed. Respiratory to continue to monitor.

## 2020-11-05 NOTE — Unmapped (Cosign Needed)
IMMUNOCOMPROMISED HOST INFECTIOUS DISEASE PROGRESS NOTE    Assessment/Plan:     Adam [name] with PMH of B-ALL on chemo, prior MRSA bacteremia and PNA requiring ICU care 01/2020, prior Candida krusei fungemia, DRESS to ceftaroline, and prior COVID infection, who presented with PNA and MRSA bacteremia and was intubated and admitted to the ICU, now extubated and clinically improved.    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  - 4/21 BMBx normocellular bone marrow 50% w/ 1% blasts (MRD)  - Extent of disease/CNS involvement: rare blast on prior CSF, intrathecal??ppx (cytarabine, methotrexate, hydrocortisone) last 09/18/2020  - Cancer-related complications: TLS, hyperbilirubinemia, MRSA bacteremia w/ septic emboli/renal failure+dialysis/intubation, C. krusei fungemia  - Prior chemotherapy: [id number] induction with dasatinib (vincristine, dexamethasone and dasatinib); C1D1 01/24/2020; difficulty tolerating single-agent dasatinib  - Current chemotherapy: blinatumomab (anti-CD19/CD3), C1D1 = 3/18 (symtomatic, titrated up dose and restarted 3/21) + ponatinib (TKI)  - currently cycle 3/5  -??Right IJ Port-A-Cath??placed 04/15/2020, removed 11/04/2020  - Infection prophylaxis prior to admission: Bactrim 1 DS MWF, valacyclovir, s/p Evusheld 04/21/2020 x2 doses, isavuconazole  - on pred 15mg  daily PTA as below  ??  Likely DIHS/DRESS, 06/12/2020 and new rash 8/01  - followed by Dr. Caryn Section, dermatology  - 3/11 punch bx  - per prior ID notes possible culprits ceftaroline, posaconazole, dasatinib, sotrovimab  - received high dose steroids in 06/2020, 3/3 - 3/7 pred 50 x5d; 06/19/20 started at 50 mg prednisone taper  -??flare on pred 10 mg daily  - pink rash noted on chest on 8/01, with rising eosinophils. Dermatology consulted and noted concern for DRESS flare, so cefepime discontinued and started on prednisone  - 8/02 improvement in rash and resolution in eosinophilia  - 8/03 itching at baseline, but rising eosinophils  ??  Pertinent Co-morbidities  None  ??  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  Infection History  Active infections:??  #MRSA bacteremia, 11/01/2020  #RLL pulmonary consolidation on 11/01/2020 CXR, new from 7/28 CT  - 7/31 BCx 1/2 Staph aureus  - 7/31 LRCx Staph aureus  - 8/01 TTE without evidence of endocarditis  - S/p Port-a-cath removal on 8/03  Rx: 7/31 linezolid/cefepime --> linezolid/daptomycin/cefepime -->8/02 linezolid/daptomycin  ??  #Non-neutropenic fever and new pulmonary nodules/infiltrates, 07/17/20  - 4/15 BC x2 NGTD  - 4/16 CT Chest: multifocal somewhat nodular consolidative opacities in b/l lungs that are relatively new, most concerning for infectious PNA (possibly fungal, especially given his recent high dose steroid taper)  - 4/17 LRCx +2??MRSA  - 4/17 Legionella uAg, serum crypto Ag, serum Asp galactomannan, urine histo ag, B-D-glucan (Fungitell), serum CMV neg  - 4/18 bronch with ZOX:WRUE cx + MRSA, OP flora,??RPP negative, Legionella cx, AFB cx, fungal cx, CMV PCR, PJP DFA, Asp galactomannan neg  - 7/28 CT chest resolved  Rx:  4/15 cefepime/dapto --> 4/16 cefepime --> 4/18 cefepime/isavu/linezolid -> 4/19 linezolid --> 4/21 isavuconazole/linezolid --> 5/10 isavuconazole -->  - Plan to complete 7 days of treatment with linezolid for MRSA PNA, and 14 days of daptomycin for MRSA bacteremia, pending culture clearance??    #Intertrigo in groin, 11/01/2020  ??  Prior infections:  ??  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  #COVID-19 Pneumonia, pos 05/28/2020 and 09/21/2020  - vaccinated 02/2020, 03/2020  - s/p Evusheld x1  04/21/2020  - prescribed molnupiravir at home, per prior notes did not take  - admitted 2/27 - 06/01/20 for observation, given sotrovimab  ??  #MRSA bacteremia + PNA + TV IE 01/27/20  - bacteremic 10/25-11/6, cleared 11/8  - pyomyositis thoracic spine, L arm  - 10/29 DISRUPT trial, exebacase v. Placebo  Tx: 10/25 vanc/cefepime -->??10/27 dapto/ceftaroline -->??11/5 ceftaroline/vanc ??->??11/16 vanc (stopped due to ototoxicity though other meds were more likely) -->Ceftaroline (w/ 1 dose DAP) 11/28 ->12/02-->Vanc 12/02 - ?  ??  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  #tree-in-bud nodularity, resolving on CT 04/28/20  ??  Antimicrobial Intolerance/allergy  ??  Cefepime - rash with concern for DRESS flare  Ceftaroline - DRESS  Dapsone - possible agranulocytosis, per chart anaphylaxis  Vancomycin - probable ototoxicity (in combination with furosemide)  Isavuconazole - elevated LFTs in the setting of TKI       RECOMMENDATIONS FOR 11/05/2020    Diagnostic  ?? Follow up 8/02 BCx  ?? Daily diff to trend eosinophils   ?? Trend BCx q48h until clear  ?? Follow up urine histo Ag  ??  Antibiotic toxicity monitoring  ?? At least weekly CBC w/ diff, CMP, CK    Treatment  For MRSA bacteremia + suspected MRSA PNA  ?? CONT Daptomycin 10mg /kg (dosing per pharmacy) for bacteremia, linezolid 600mg  q12h (via OGT or IV) for PNA  ??  For intertrigo  ?? Topical antifungal, clotrimazole or nystatin powder, to groin  ??  For hx pulmonary nodules  ?? CONT isavuconazole 372mg  daily (via OG or IV) for now  ??  For history of DRESS and new rash  ?? Appreciate dermatology consult  ?? Avoid cephalosporins  ?? Ok to increase steroids if concern for DRESS  ??  Prophylaxis  ?? CONT valacyclovir  ?? CONT Bactrim 1 DS tab MWF  ?? CONT isavuconazole  ??  Immunizations  ?? COVID-19 booster prior to discharge  ?? Prevnar-20  ?? Shingrix  All can be given prior to discharge            The [facility] ID service will continue to follow.  Please page the ID Transplant/Liquid Oncology Fellow consult at [phone] with questions.  Patient discussed with Dr. Para March.    Sarita Haver, MSPH, MD  Fellow, [facility] Division of Infectious Diseases    Subjective:     Interval History: Feeling well this morning. Still with mild cough and pain in R side. He has not had subjective fevers, chills, nausea, vomiting, or diarrhea.  History obtained from:patient and nurse.    Medications:  Antimicrobials:  Anti-infectives (From admission, onward)    Start     Dose/Rate Route Frequency Ordered Stop    11/01/20 1700  DAPTOmycin (CUBICIN) 1,150 mg in sodium chloride (NS) 0.9 % 50 mL IVPB         10 mg/kg ?? 113.4 kg  160 mL/hr over 30 Minutes Intravenous Every 24 hours 11/01/20 1630 11/15/20 1659    11/01/20 0900  isavuconazonium sulfate (CRESEMBA) capsule 372 mg         372 mg Oral Daily (standard) 11/01/20 0531      11/01/20 0900  linezolid in dextrose 5% (ZYVOX) 600 mg/300 mL IVPB 600 mg         600 mg  300 mL/hr over 60 Minutes Intravenous Every 12 hours scheduled 11/01/20 0531 11/11/20 0859          Prior/Current immunomodulators: prednisone 20 mg daily, blinatumomab (anti-CD19/CD3)    Other medications  reviewed.    Objective:     Vital Signs last 24 hours:  Temp:  [36.5 ??C (97.7 ??F)] 36.5 ??C (97.7 ??F)  Core Temp:  [36.8 ??C (98.24 ??F)-37.4 ??C (99.32 ??F)] 37.3 ??C (99.14 ??F)  Heart Rate:  [67-88] 68  SpO2 Pulse:  [66-88] 68  Resp:  [11-24] 17  BP: (126-138)/(70-81) 137/72  MAP (mmHg):  [88-94] 94  A BP-2: (151-199)/(80-101) 172/90  MAP:  [103 mmHg-131 mmHg] 114 mmHg  FiO2 (%):  [50 %-70 %] 60 %  SpO2:  [86 %-99 %] 96 %    Physical Exam:  Patient Lines/Drains/Airways Status     Active Active Lines, Drains, & Airways     Name Placement date Placement time Site Days    Peripheral IV 11/01/20 Left Forearm 11/01/20  0330  Forearm  4    Peripheral IV 11/01/20 Anterior;Proximal;Right Forearm 11/01/20  1200  Forearm  4              GEN:  looks well, no apparent distress  EYES: sclerae anicteric and non injected  ZOX:WRUEAVWUJ good and moist mucous membranes  LYMPH:no cervical or supraclavicular LAD  CV:RRR, no abnormal heart sounds noted and no peripheral edema  PULM:normal work of breathing at rest, TTP on R lower lateral chest, breathing comfortably on HFNC  WJ:XBJY, NTND  NW:GNFAOZHY  RECTAL:deferred  SKIN:erythema on back, resolution of chest erythema, no raised lesions  MSK:no swollen joints  NEURO:no tremor noted, facial expression symmetric and moves extremities equally  PSYCH:interactive and pleasant    Data for Medical Decision Making     Recent Labs   Lab Units 11/05/20  0415 11/04/20  0418 11/03/20  1524 11/03/20  1015 11/03/20  0506 11/02/20  2021 11/02/20  1712 11/02/20  1114 11/02/20  0827 11/02/20  0510 11/01/20  1646 11/01/20  1547 11/01/20  1322 11/01/20  0628   WBC 10*9/L 8.8 12.1*  --   --  13.1*  --   --   --   --  22.3*  --   --   --  25.7*   HEMOGLOBIN g/dL 9.9* 86.5*  --   --  9.9*  --   --   --   --  11.3*  --   --   --  13.6   HEMOGLOBIN BG g/dL  --   --  78.4*  --   --   --   --   --  6.9*  --   --   --    < >  --    PLATELET COUNT (1) 10*9/L 188 191  --   --  173  --   --   --   --  192  --   --   --  198   NEUTRO ABS 10*9/L 7.1 10.5*  --   --  12.5*  --   --   --   --  20.0*  --   --   --  24.2*   LYMPHO ABS 10*9/L 0.6* 0.5*  --   --  0.2*  --   --   --   --  0.4*  --   --   --  0.7*   EOSINO ABS 10*9/L 0.6* 0.6*  --   --  0.1  --   --   --   --  1.1*  --   --   --  0.0   BUN mg/dL 27* 28*  --   --  45*  --  42*  --   --  42*  --  36*  --  22   CREATININE mg/dL 1.47* 8.29*  --   --  5.62*  --  2.54*  --   --  2.74*  --  2.44*  --  2.40*   AST U/L 18 19  --   --  16  --   --   --   --  9  --   --   --  16   ALT U/L 28 24  --   --  22  --   --   --   --  27  --   --   --  48   BILIRUBIN TOTAL mg/dL 0.2* 0.2*  --   --  0.2*  --   --   --   --  0.2*  --   --   --  0.3   ALK PHOS U/L 110 106  --   --  100  --   --   --   --  87  --   --   --  93   POTASSIUM WHOLE BLOOD mmol/L  --   --  3.8 3.7 3.6   < >  --    < > 3.4 4.3   < >  --    < >  --    POTASSIUM mmol/L 3.6 3.7  --   --  4.3  --  4.3  --   --  4.5  --  4.8  --  4.4   MAGNESIUM mg/dL 1.9 2.0  --   --  2.5  --   --   --   --  2.2  --  1.3*  --  1.6   PHOSPHORUS mg/dL 3.4 2.7  --   --  4.2  --   --   --   --  5.3*  --  4.8  --  5.4*   CALCIUM mg/dL 8.7 8.8  --   --  8.1*  --  7.9*  --   --  8.3*  --  7.5*  --  8.9   CK TOTAL U/L  --   --   --   --   --   --   --   --   --   --   --  38.0*  --   --     < > = values in this interval not displayed.       New Micro Data  7/31 BCx MRSA in 1/2   7/31 LRCx MRSA  7/31 UCx NG  8/01 urine legionella Ag negative  8/01 SCrAg negative  8/01 urine histo Ag pending  8/02 BCx NG  8/06 Catheter tip Cx pending    Recent Studies  CXR 7/30:  Patchy opacification along the medial right lung base with asymmetrical elevation of the hemidiaphragm, may reflect atelectasis, however, developing infectious process (e.g. pneumonia) is not entirely excluded.    CXR 7/31:  1.Interval placement of an endotracheal tube with tip terminating above the level of the carina.  2.Suspect small-to-moderate bilateral pleural effusions with associated passive atelectasis of adjacent lung parenchyma (right greater than left).  3.Additional findings suggestive of mild interstitial pulmonary edema, as above.    Abdominal XR 8/01:  Esophagogastric tube in appropriate positioning.    TTE 8/01:    1. No evidence of valvular or endocardial vegetation.    2. The left ventricle is normal in size  with normal wall thickness.    3. The left ventricular systolic function is normal, LVEF is visually  estimated at > 55%.    4. The right ventricle is normal in size, with normal systolic function.    PVL VENOUS DUPLEX LOWER EXTREMITY BILATERAL 8/01:  Right  There is no evidence of DVT in the lower extremity. There is no evidence of obstruction proximal to the inguinal ligament or in the common femoral vein.  Left  There is no evidence of DVT in the lower extremity. There is no evidence of obstruction proximal to the inguinal ligament or in the common femoral vein.    PVL VENOUS DUPLEX UPPER EXTREMITY BILATERAL 8/01:  Right  No evidence of DVT detected in the central veins or arm veins.  Left  No evidence of DVT detected in the central veins or arm veins.     CXR 8/02:   No acute changes.    CXR 8/02 PM:  Persistent bibasilar consolidation right greater than left. Endotracheal tube removed.

## 2020-11-05 NOTE — Unmapped (Signed)
MICU Daily Progress Note     Date of Service: 11/05/2020    Problem List:   Principal Problem:    Pneumonia  Active Problems:    Tobacco use disorder    AKI (acute kidney injury) (CMS-HCC)    MRSA bacteremia    Recurrent major depressive disorder, in partial remission (CMS-HCC)    Immunocompromised (CMS-HCC)    Hypogammaglobulinemia (CMS-HCC)    ALL (acute lymphoblastic leukemia) (CMS-HCC)    Respiratory failure with hypoxia (CMS-HCC)    Chronic pain    Acute kidney injury superimposed on CKD (CMS-HCC)    COPD (chronic obstructive pulmonary disease) (CMS-HCC)  Resolved Problems:    * No resolved hospital problems. *      HPI: Gradyn Shein is a 40 y.o. male with PH+ B-ALL, COPD, hospitalization 07/2020 w/ MRSA bacteremia, Candida Krusei fungemia, active smoking, sp Covid infection 09/2020 followed by [facility] ID who presented to [facility] with worsening SOB x48 hours.    History provided by patient and wife. He was in his normal state of health until 10/29/20 when he started developing worsening SOB, productive cough and chills. Coughing spells were intermittently associated with he denies sick contacts, fever, diarrhea, dysuria, polyuria, aspiration or focalizing pain. Pt endorses taking all his medicines as prescribed including antifungal and ppx meds.     Of not pt hospitalized 08/2020 for induction therapy that was complicated by Septic Shock due to MRSA and Candida krusei fungemia, acute renal failure necessitating temporary iHD and AHRB requiring intubation and stay in MICU    Pt endorses being full code status and his decision maker is his wife as listed in epic.     Interval Events:  - remained on HFNC overnight  - anxious overnight got a dose of ativan   - restarted oral chemo yesterday    Neurological   Acute on Chronic Pain, Anxiety Followed by Palliative care  -- holding home oxycontin 15mg  bid, dilaudid 2mg  po q4 prn mod sev pain and klonipin 0.5mg  bid prn anxiety  - cont home gabapentin   -  scheduled oxy to 20 q 6 hr and add oral PRN's.    - appreciate psychiatry's assistance, added ativan 0.5 mg bid PRN     Depression  -- Continue home nortriptyline    Pulmonary   AHRF 2/2 Pneumonia with History of Intubation, tobacco abuse   Arrived in ed needing bipap placement 70%. Duonebs given 2/2 history of copd and wheezing RLL in area of consolidation. Pt down to 65% with transition to Bipap 10/5.  - amidst scheduled nebs, airway clearance and narcotic for splinting, pt progressed to needing intubation d/t inc WOB and worsening hypoxia  - RSI per anesthesia w difficulty oxygenating afterwards, likely r/t de-recruitment and shunt physiology  - cont duoneb scheduled q6  - weaned to 80% + 12 this am.  No desats with awakening today, but does with turn.  Cont to wean for PaO2 > 55%.  DP stable on PEEP of 12   - 8/2: stable on PS this am and passed SBT.  Extubated to cpap and now on HFNC 65%.  Still desats laying flat or turning on his right side   - 8/3: CPAP 40% but HFNC 80%.  Pain with inspiration, will try dose of toradol. Otherwise comfortably breathing. ultrasound of R chest does not show large effusion or concern for empyema.   - goal wean HFNC to [facility] [employer]    OSA  Pt with known sleep apnea on sleep  study. Does not use cpap.    Cardiovascular   Tachycardia  Hypotension s/p intubationLD: resolved   - normotensive on arrival, tachy improved w fluid and analgesia  - EKG no evid acs and trop flat  - addtl bolus given on arrival to MICU and peri intubation  - levo started for MAP goals >65: OFF   - formal echo: normal   - cont home regimen asa    Renal   AKI on CKD: resolved   Metabolic acidosis: resolved   Baseline Cr around 1.8. AKI to 2.4 most likely pre-renal in setting of NV from pneumonia and illness.  -- trend I/O w foley   -- downtrend Cr since admit w volume repletion    Infectious Disease/Autoimmune   Pneumonia w hx MRSA bacteremia, cand fungemia  48 hours of chills, sob and productive cough with cxr cf rll pneumonia and leukocytosis. Most likely bacterial infection as pt on ppx  With cresemba, bactrim and valtrex. Initial lactate 2.2 down to 1.2 with IVF resusitation. Given vanc in ED and will stop vancomycin given hx of hearing loss.    - febrile, leukocytosis   - transferred to [facility] on linezolid, mero, mica >> changed mero to dapto and cefepime given past tolerance to cefepime  - cont abd regimen per ICID  - scheduled apap for fevers  - f/u cx data >> one of two blodd cx positive for GPC, known prior mrsa  - cont contact precautions for MRSA  - holding BAL for now given tenuous resp status and reasonable trach aspirate   - Will require port removal: discussed with IR and they are able to come to MICU to remove if needed.  Discuss with ID   - repeat cx q 48 hrs  -Urine histo Ag pending  -Urine legionella Ag neg   -Serum crypto Ag neg     For MRSA bacteremia + suspected MRSA PNA  ?? Daptomycin  ?? Linezolid 600mg  q12h,  ?? Port REMOVED 8/3     PPX:  -- Continue Home crescemba  -- Continue home bactrim, ss MWF  -- continue home valtrex 500mg  daily    Cultures:  Blood Culture, Routine (no units)   Date Value   11/03/2020 No Growth at 48 hours     Urine Culture, Comprehensive (no units)   Date Value   11/01/2020 NO GROWTH     Lower Respiratory Culture (no units)   Date Value   11/01/2020 3+ Methicillin resistant Staphylococcus aureus (A)     WBC (10*9/L)   Date Value   11/05/2020 8.8         History of Dress  Hx of dress on ceftaroline was on prednisone taper in may 2022, but may still have been on 15 mg/day.  Verifying with wife.    -- monitor for dress while undergoing tx with abx  - derm consult   - pred 20 mg restarted 8/1.    - stopped cefepime     FEN/GI   #NAI  - senna and miralax for bowel regimen  - reg diet        Malnutrition Assessment: Not done yet.  Body mass index is 32.08 kg/m??.               Heme/Coag   Ph+ B-ALL  Followed by [facility] [name] on  C3D27 ponatinib-binatumomab running infusion upon transfer to MICU.   -- Consult Hem/Onc regarding chemotherapy/immunotherapy >> holding infusion for now, stopped 7/31  -- cont  asa 81mg  daily while on ponatinib per onc recs  - oral chemo resumed 8/3.  Will need new line for iv chemo. Timing per onc/ICID     Hypogammaglobulinemia  Recieves IVIG q 6 weeks. Will check Ig levels as may benefit from IVIG in setting of infection.  --IgM, IgA, IgG    Endocrine   NAI  - accu checks q6    Integumentary   #NAI  - WOCN consulted for high risk skin assessment Yes.  - WOCN recs >> pending   - cont pressure mitigating precautions per skin policy    Prophylaxis/LDA/Restraints/Consults   Can CVC be removed? yes  Can A-line be removed? N/A, no A-line present  Can Foley be removed? N/A, no Foley present  Mobility plan: Step 4 - Out of bed assessment (sit at bedside)    Feeding: reg diet   Analgesia: Pain not adequately controlled, titrating medications  Sedation SAT/SBT: yes  Thromboembolic ppx: SQ heparin  Head of bed >30 degrees: Yes  Ulcer ppx: Not indicated  Glucose within target range: Yes, in range    Does patient need/have an active type/screen? Yes    RASS at goal? No - adjusting sedation or order to reflect need  [place] Agitation Assessment Scale (RASS) : Alert and calm (11/05/2020 10:00 AM)     Can antipsychotics be stopped? N/A, not on antipsychotics  CAM-ICU Result: CAM-ICU Negative (11/05/2020  8:00 AM)      Would hospice care be appropriate for this patient? No, patient improving or expected to improve    Patient Lines/Drains/Airways Status     Active Active Lines, Drains, & Airways     Name Placement date Placement time Site Days    Peripheral IV 11/01/20 Left Forearm 11/01/20  0330  Forearm  4    Peripheral IV 11/01/20 Anterior;Proximal;Right Forearm 11/01/20  1200  Forearm  3              Patient Lines/Drains/Airways Status     Active Wounds     None                Goals of Care     Code Status: Full Code    Designated Healthcare Decision Maker:  Mr. Priebe current decisional capacity for healthcare decision-making is Full capacity. His designated Educational psychologist) is/are   HCDM (patient stated preference): Derral, Colucci - Spouse - [phone].      Subjective   Wants to go home           Allergies  Allergies   Allergen Reactions   ??? Bupropion Hcl Other (See Comments)     Per patient out of touch with reality, suicidal, homicidal   ??? Cefepime Rash     DRESS   ??? Ceftaroline Fosamil Rash and Other (See Comments)     Rash X 2 02/2020, suspected DRESS 06/2020   ??? 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 Onion Anaphylaxis   ??? Vancomycin Analogues      Hearing loss with Lasix  Other reaction(s): Other (See Comments)  Hearing loss  lasix   ??? Bismuth Subsalicylate Nausea And Vomiting   ??? Furosemide      With Vancomycin caused hearing loss  Other reaction(s): Other (See Comments)  With Vancomycin caused hearing loss       Meds  No current facility-administered medications on file prior to encounter.     Current Outpatient Medications on File Prior to Encounter  Medication Sig   ??? acetaminophen (TYLENOL) 325 MG tablet Take 650 mg by mouth every six (6) hours as needed for pain.    ??? amLODIPine (NORVASC) 10 MG tablet Take 1 tablet (10 mg total) by mouth daily.   ??? aspirin 81 MG chewable tablet Chew 1 tablet (81 mg total) daily.   ??? carvediloL (COREG) 12.5 MG tablet TAKE 1 TABLET BY MOUTH TWO TIMES A DAY.   ??? clonazePAM (KLONOPIN) 0.5 MG tablet Take 1 tablet (0.5 mg total) by mouth two (2) times a day as needed for anxiety or sleep.   ??? gabapentin (NEURONTIN) 300 MG capsule Take 1 capsule (300 mg total) by mouth Three (3) times a day.   ??? hydrOXYzine (ATARAX) 25 MG tablet Take 1 tablet (25 mg total) by mouth every six (6) hours as needed.   ??? isavuconazonium sulfate (CRESEMBA) 186 mg cap capsule Take 2 capsules (372 mg total) by mouth daily.   ??? OXYCONTIN 15 mg 12 hr crush resistant ER/CR tablet TAKE 1 TABLET (15 MG TOTAL) BY MOUTH TWO (2) TIMES A DAY FOR 28 DAYS.   ??? PONATinib (ICLUSIG) 30 mg tablet Take 1 tablet (30 mg total) by mouth daily. Swallow tablets whole. Do not crush, break, cut or chew tablets.   ??? sulfamethoxazole-trimethoprim (BACTRIM DS) 800-160 mg per tablet Take 1 tablet (160 mg of trimethoprim total) by mouth 2 times a day on Saturday, Sunday. For prophylaxis while on chemo.   ??? valACYclovir (VALTREX) 500 MG tablet Take 1 tablet (500 mg total) by mouth daily.   ??? nortriptyline (PAMELOR) 75 MG capsule Take 1 capsule (75 mg total) by mouth nightly.       Past Medical History  Past Medical History:   Diagnosis Date   ??? 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 BONE MARROW BIOPSY & ASPIRATION  01/21/2020        ??? IR INSERT PORT AGE GREATER THAN 5 YRS  03/10/2020    IR INSERT PORT AGE GREATER THAN 5 YRS 03/10/2020 [name], MD IMG VIR H&V [facility]   ??? PR BRONCHOSCOPY,DIAGNOSTIC W LAVAGE Bilateral 07/20/2020    Procedure: BRONCHOSCOPY, RIGID OR FLEXIBLE, INCLUDE FLUOROSCOPIC GUIDANCE WHEN PERFORMED; W/BRONCHIAL ALVEOLAR LAVAGE WITH MODERATE SEDATION;  Surgeon: Dellis Filbert, MD;  [facility]: [facility] PROC[facility];  Service: Pulmonary         Objective     Vitals - past 24 hours  Temp:  [36.6 ??C (97.9 ??F)-37 ??C (98.6 ??F)] 36.6 ??C (97.9 ??F)  Heart Rate:  [70-89] 83  SpO2 Pulse:  [66-93] 83  Resp:  [12-37] 15  BP: (134-138)/(75) 134/75  FiO2 (%):  [40 %-75 %] 70 %  SpO2:  [86 %-99 %] 90 % Intake/Output  I/O last 3 completed shifts:  In: 590 [P.O.:150; I.V.:140; IV Piggyback:300]  Out: 2970 [Urine:2970]     Physical Exam:    General:  Awake and alert   HEENT: MMM,   CV: Soft S1,S2, no murmurs, no BLE edema  Pulm: Diminished throughout and bases w R>L, apices few scattered rhonchi, no wheezing  GI:  NTND, BS+ no rebound or guarding x near Rt CPA and chest wall  MSK: normal muslce tone  Skin: no rashes/esions, clammy   Neuro: FC, mAE Continuous Infusions:   ??? IP okay to treat         ???  IP okay to treat         Scheduled Medications:   ??? acetaminophen  1,000 mg Enteral tube: gastric  Q8H   ??? amLODIPine  10 mg Oral Daily   ??? aspirin  81 mg Enteral tube: gastric  Daily   ??? carvediloL  12.5 mg Oral BID   ??? DAPTOmycin  10 mg/kg Intravenous Q24H   ??? gabapentin  300 mg Enteral tube: gastric  TID   ??? heparin (porcine) for subcutaneous use  5,000 Units Subcutaneous Q8H [facility]   ??? ipratropium-albuteroL  3 mL Nebulization Q6H (RT)   ??? isavuconazonium sulfate  372 mg Oral Daily   ??? lidocaine  1 patch Transdermal Daily   ??? linezolid  600 mg Intravenous Q12H [facility]   ??? melatonin  3 mg Enteral tube: gastric  QPM   ??? nortriptyline  75 mg Enteral tube: gastric  Nightly   ??? oxyCODONE  20 mg Enteral tube: gastric  Q6H SCH   ??? polyethylene glycol  17 g Enteral tube: gastric  BID   ??? PONATinib  30 mg Oral Q24H   ??? predniSONE  20 mg Enteral tube: gastric  Daily   ??? senna  2 tablet Enteral tube: gastric  Nightly   ??? sulfamethoxazole-trimethoprim  1 tablet Enteral tube: gastric  Once per day on Mon Wed Fri   ??? valACYclovir  500 mg Enteral tube: gastric  Daily       PRN medications:  IP okay to treat, LORazepam, oxyCODONE    Data/Imaging Review: Reviewed in Epic and personally interpreted on 11/05/2020. See EMR for detailed results.      Critical Care Attestation     This patient is critically ill or injured with the impairment of vital organ systems such that there is a high probability of imminent or life threatening deterioration in the patient's condition. This patient must remain in the ICU for ongoing evaluation of the comprehensive management plan outlined in this note. I directly provided critical care services as documented in this note and the critical care time spent (45  min) is exclusive of separately billable procedures.  In addition to time spent for critical care management, I also provided advance care planning services for 0  minutes (see ACP note for details)???. Total billable critical care time 45 minutes.    Aarvi Stotts Paulino Door, PA

## 2020-11-05 NOTE — Unmapped (Signed)
Remains with HFNC 50 LPM @40 % FiO2 during the day, and CPAP +10 @50 % at night.    X1 PRN Ativan for anxiety

## 2020-11-06 LAB — COMPREHENSIVE METABOLIC PANEL
ALBUMIN: 2.6 g/dL — ABNORMAL LOW (ref 3.4–5.0)
ALKALINE PHOSPHATASE: 127 U/L — ABNORMAL HIGH (ref 46–116)
ALT (SGPT): 28 U/L (ref 10–49)
ANION GAP: 5 mmol/L (ref 5–14)
AST (SGOT): 19 U/L (ref <=34)
BILIRUBIN TOTAL: 0.2 mg/dL — ABNORMAL LOW (ref 0.3–1.2)
BLOOD UREA NITROGEN: 23 mg/dL (ref 9–23)
BUN / CREAT RATIO: 15
CALCIUM: 9 mg/dL (ref 8.7–10.4)
CHLORIDE: 107 mmol/L (ref 98–107)
CO2: 26 mmol/L (ref 20.0–31.0)
CREATININE: 1.51 mg/dL — ABNORMAL HIGH
EGFR CKD-EPI (2021) MALE: 59 mL/min/{1.73_m2} — ABNORMAL LOW (ref >=60)
GLUCOSE RANDOM: 101 mg/dL (ref 70–179)
POTASSIUM: 4.2 mmol/L (ref 3.4–4.8)
PROTEIN TOTAL: 6.1 g/dL (ref 5.7–8.2)
SODIUM: 138 mmol/L (ref 135–145)

## 2020-11-06 LAB — CBC W/ AUTO DIFF
BASOPHILS ABSOLUTE COUNT: 0.1 10*9/L (ref 0.0–0.1)
BASOPHILS RELATIVE PERCENT: 0.6 %
EOSINOPHILS ABSOLUTE COUNT: 0.5 10*9/L (ref 0.0–0.5)
EOSINOPHILS RELATIVE PERCENT: 5.3 %
HEMATOCRIT: 30.6 % — ABNORMAL LOW (ref 39.0–48.0)
HEMOGLOBIN: 10.5 g/dL — ABNORMAL LOW (ref 12.9–16.5)
LYMPHOCYTES ABSOLUTE COUNT: 1.1 10*9/L (ref 1.1–3.6)
LYMPHOCYTES RELATIVE PERCENT: 11 %
MEAN CORPUSCULAR HEMOGLOBIN CONC: 34.2 g/dL (ref 32.0–36.0)
MEAN CORPUSCULAR HEMOGLOBIN: 32.8 pg — ABNORMAL HIGH (ref 25.9–32.4)
MEAN CORPUSCULAR VOLUME: 95.8 fL — ABNORMAL HIGH (ref 77.6–95.7)
MEAN PLATELET VOLUME: 8.4 fL (ref 6.8–10.7)
MONOCYTES ABSOLUTE COUNT: 0.5 10*9/L (ref 0.3–0.8)
MONOCYTES RELATIVE PERCENT: 4.9 %
NEUTROPHILS ABSOLUTE COUNT: 7.7 10*9/L (ref 1.8–7.8)
NEUTROPHILS RELATIVE PERCENT: 78.2 %
PLATELET COUNT: 218 10*9/L (ref 150–450)
RED BLOOD CELL COUNT: 3.2 10*12/L — ABNORMAL LOW (ref 4.26–5.60)
RED CELL DISTRIBUTION WIDTH: 16.5 % — ABNORMAL HIGH (ref 12.2–15.2)
WBC ADJUSTED: 9.8 10*9/L (ref 3.6–11.2)

## 2020-11-06 LAB — MAGNESIUM: MAGNESIUM: 1.9 mg/dL (ref 1.6–2.6)

## 2020-11-06 LAB — EBV QUANTITATIVE PCR, BLOOD: EBV VIRAL LOAD RESULT: NOT DETECTED

## 2020-11-06 LAB — HISTOPLASMA ANTIGEN, URINE
HISTOPLASMA AG, URINE RESULT: NOT DETECTED
HISTOPLASMA AG, URINE VALUE: NOT DETECTED ng/mL

## 2020-11-06 LAB — CK: CREATINE KINASE TOTAL: 22 U/L — ABNORMAL LOW

## 2020-11-06 LAB — PHOSPHORUS: PHOSPHORUS: 3.4 mg/dL (ref 2.4–5.1)

## 2020-11-06 MED ADMIN — ipratropium-albuteroL (DUO-NEB) 0.5-2.5 mg/3 mL nebulizer solution 3 mL: 3 mL | RESPIRATORY_TRACT | @ 20:00:00

## 2020-11-06 MED ADMIN — gabapentin (NEURONTIN) capsule 300 mg: 300 mg | GASTROENTERAL | @ 01:00:00

## 2020-11-06 MED ADMIN — senna (SENOKOT) tablet 2 tablet: 2 | GASTROENTERAL | @ 01:00:00

## 2020-11-06 MED ADMIN — carvediloL (COREG) tablet 12.5 mg: 12.5 mg | ORAL | @ 01:00:00

## 2020-11-06 MED ADMIN — ketorolac (TORADOL) injection 15 mg: 15 mg | INTRAVENOUS | @ 23:00:00 | Stop: 2020-11-11

## 2020-11-06 MED ADMIN — ketorolac (TORADOL) injection 15 mg: 15 mg | INTRAVENOUS | @ 15:00:00 | Stop: 2020-11-11

## 2020-11-06 MED ADMIN — PONATinib (ICLUSIG) tablet 30 mg **PATIENT SUPPLIED**: 30 mg | ORAL | @ 21:00:00

## 2020-11-06 MED ADMIN — heparin (porcine) 5,000 unit/mL injection 5,000 Units: 5000 [IU] | SUBCUTANEOUS | @ 03:00:00

## 2020-11-06 MED ADMIN — predniSONE (DELTASONE) tablet 20 mg: 20 mg | ORAL | @ 13:00:00

## 2020-11-06 MED ADMIN — sulfamethoxazole-trimethoprim (BACTRIM DS) 800-160 mg tablet 160 mg of trimethoprim: 1 | ORAL | @ 13:00:00 | Stop: 2020-12-02

## 2020-11-06 MED ADMIN — oxyCODONE (ROXICODONE) immediate release tablet 20 mg: 20 mg | ORAL | @ 13:00:00 | Stop: 2020-11-17

## 2020-11-06 MED ADMIN — ipratropium-albuteroL (DUO-NEB) 0.5-2.5 mg/3 mL nebulizer solution 3 mL: 3 mL | RESPIRATORY_TRACT | @ 10:00:00

## 2020-11-06 MED ADMIN — linezolid in dextrose 5% (ZYVOX) 600 mg/300 mL IVPB 600 mg: 600 mg | INTRAVENOUS | @ 13:00:00 | Stop: 2020-11-11

## 2020-11-06 MED ADMIN — carvediloL (COREG) tablet 12.5 mg: 12.5 mg | ORAL | @ 13:00:00

## 2020-11-06 MED ADMIN — gabapentin (NEURONTIN) capsule 300 mg: 300 mg | ORAL | @ 17:00:00

## 2020-11-06 MED ADMIN — acetaminophen (TYLENOL) tablet 1,000 mg: 1000 mg | ORAL | @ 13:00:00 | Stop: 2020-12-01

## 2020-11-06 MED ADMIN — enoxaparin (LOVENOX) syringe 40 mg: 40 mg | SUBCUTANEOUS | @ 23:00:00

## 2020-11-06 MED ADMIN — isavuconazonium sulfate (CRESEMBA) capsule 372 mg: 372 mg | ORAL | @ 13:00:00

## 2020-11-06 MED ADMIN — oxyCODONE (ROXICODONE) immediate release tablet 20 mg: 20 mg | GASTROENTERAL | @ 04:00:00 | Stop: 2020-11-17

## 2020-11-06 MED ADMIN — zinc sulfate (ZINCATE) capsule 220 mg: 220 mg | ORAL | @ 13:00:00 | Stop: 2020-11-19

## 2020-11-06 MED ADMIN — nortriptyline (PAMELOR) capsule 75 mg: 75 mg | GASTROENTERAL | @ 01:00:00

## 2020-11-06 MED ADMIN — aspirin chewable tablet 81 mg: 81 mg | ORAL | @ 13:00:00

## 2020-11-06 MED ADMIN — gabapentin (NEURONTIN) capsule 300 mg: 300 mg | ORAL | @ 13:00:00

## 2020-11-06 MED ADMIN — amLODIPine (NORVASC) tablet 10 mg: 10 mg | ORAL | @ 13:00:00

## 2020-11-06 MED ADMIN — oxyCODONE (ROXICODONE) immediate release tablet 20 mg: 20 mg | GASTROENTERAL | @ 10:00:00 | Stop: 2020-11-06

## 2020-11-06 MED ADMIN — heparin (porcine) 5,000 unit/mL injection 5,000 Units: 5000 [IU] | SUBCUTANEOUS | @ 10:00:00 | Stop: 2020-11-06

## 2020-11-06 MED ADMIN — acetaminophen (TYLENOL) tablet 1,000 mg: 1000 mg | GASTROENTERAL | @ 04:00:00 | Stop: 2020-12-01

## 2020-11-06 MED ADMIN — acetaminophen (TYLENOL) tablet 1,000 mg: 1000 mg | ORAL | @ 21:00:00 | Stop: 2020-12-01

## 2020-11-06 MED ADMIN — linezolid in dextrose 5% (ZYVOX) 600 mg/300 mL IVPB 600 mg: 600 mg | INTRAVENOUS | @ 01:00:00 | Stop: 2020-11-11

## 2020-11-06 MED ADMIN — ipratropium-albuteroL (DUO-NEB) 0.5-2.5 mg/3 mL nebulizer solution 3 mL: 3 mL | RESPIRATORY_TRACT | @ 01:00:00

## 2020-11-06 MED ADMIN — oxyCODONE (ROXICODONE) immediate release tablet 20 mg: 20 mg | ORAL | @ 17:00:00 | Stop: 2020-11-17

## 2020-11-06 MED ADMIN — DAPTOmycin (CUBICIN) 1,150 mg in sodium chloride (NS) 0.9 % 50 mL IVPB: 10 mg/kg | INTRAVENOUS | @ 22:00:00 | Stop: 2020-11-15

## 2020-11-06 MED ADMIN — melatonin tablet 3 mg: 3 mg | GASTROENTERAL | @ 04:00:00

## 2020-11-06 MED ADMIN — valACYclovir (VALTREX) tablet 500 mg: 500 mg | ORAL | @ 13:00:00

## 2020-11-06 MED ADMIN — melatonin tablet 3 mg: 3 mg | ORAL | @ 22:00:00

## 2020-11-06 MED ADMIN — oxyCODONE (ROXICODONE) immediate release tablet 20 mg: 20 mg | ORAL | @ 21:00:00 | Stop: 2020-11-17

## 2020-11-06 NOTE — Unmapped (Signed)
MICU Nightshift Note     Date of Service: 11/05/2020    Principal Problem:    Pneumonia  Active Problems:    Tobacco use disorder    AKI (acute kidney injury) (CMS-HCC)    MRSA bacteremia    Recurrent major depressive disorder, in partial remission (CMS-HCC)    Immunocompromised (CMS-HCC)    Hypogammaglobulinemia (CMS-HCC)    ALL (acute lymphoblastic leukemia) (CMS-HCC)    Respiratory failure with hypoxia (CMS-HCC)    Chronic pain    Acute kidney injury superimposed on CKD (CMS-HCC)    COPD (chronic obstructive pulmonary disease) (CMS-HCC)  Resolved Problems:    * No resolved hospital problems. *      Valton Schwartz is a 40 y.o. 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  Course notable for ongoing hypoxemic respiratory failure requiring HFNC.    Cross cover summary       No acute events overnight.  Attempted to wean down FiO2 on HFNC with some success; transitioned to BIPAP for sleep.  Would consider trial of large bore [place] in the morning.      [name], MD  [facility] Pulmonary and Critical Care Fellow

## 2020-11-06 NOTE — Unmapped (Signed)
Patient initiated shift on HFNC with setting weaned. Pt then placed on CPAP to rest overnight. Breathing treatments given with no adverse reaction noted.

## 2020-11-06 NOTE — Unmapped (Signed)
HFNC decreased to 40LPM and 40% Fio@= pt tolerated well.  On CPAP overnight with same settings, +10 and 50%.      VSS, will continue to monitor.

## 2020-11-06 NOTE — Unmapped (Signed)
MICU Daily Progress Note     Date of Service: 11/06/2020    Problem List:   Principal Problem:    Pneumonia  Active Problems:    Tobacco use disorder    AKI (acute kidney injury) (CMS-HCC)    MRSA bacteremia    Recurrent major depressive disorder, in partial remission (CMS-HCC)    Immunocompromised (CMS-HCC)    Hypogammaglobulinemia (CMS-HCC)    ALL (acute lymphoblastic leukemia) (CMS-HCC)    Respiratory failure with hypoxia (CMS-HCC)    Chronic pain    Acute kidney injury superimposed on CKD (CMS-HCC)    COPD (chronic obstructive pulmonary disease) (CMS-HCC)  Resolved Problems:    * No resolved hospital problems. *      HPI: Adam [name] with PH+ B-ALL, COPD, hospitalization 07/2020 w/ MRSA bacteremia, Candida Krusei fungemia, active smoking, sp Covid infection 09/2020 followed by [facility] who presented to [facility] with worsening SOB x48 hours.    History provided by patient and wife. He was in his normal state of health until 10/29/20 when he started developing worsening SOB, productive cough and chills. Coughing spells were intermittently associated with he denies sick contacts, fever, diarrhea, dysuria, polyuria, aspiration or focalizing pain. Pt endorses taking all his medicines as prescribed including antifungal and ppx meds.     Of not pt hospitalized 08/2020 for induction therapy that was complicated by Septic Shock due to MRSA and Candida krusei fungemia, acute renal failure necessitating temporary iHD and AHRB requiring intubation and stay in MICU    Pt endorses being full code status and his decision maker is his wife as listed in epic.     Interval Events:  - remained on HFNC overnight  - No acute issues     Neurological   Acute on Chronic Pain, Anxiety Followed by Palliative care  -- holding home oxycontin 15mg  bid, dilaudid 2mg  po q4 prn mod sev pain and klonipin 0.5mg  bid prn anxiety  - cont home gabapentin   -  scheduled oxy to 20 q 6 hr and add oral PRN's.    - appreciate psychiatry's assistance, added ativan 0.5 mg bid PRN     Depression  -- Continue home nortriptyline    Pulmonary   AHRF 2/2 Pneumonia with History of Intubation, tobacco abuse   Arrived in ed needing bipap placement 70%. Duonebs given 2/2 history of copd and wheezing RLL in area of consolidation. Pt down to 65% with transition to Bipap 10/5.  - amidst scheduled nebs, airway clearance and narcotic for splinting, pt progressed to needing intubation d/t inc WOB and worsening hypoxia  - RSI per anesthesia w difficulty oxygenating afterwards, likely r/t de-recruitment and shunt physiology  - cont duoneb scheduled q6  - weaned to 80% + 12 this am.  No desats with awakening today, but does with turn.  Cont to wean for PaO2 > 55%.  DP stable on PEEP of 12   - 8/2: stable on PS this am and passed SBT.  Extubated to cpap and now on HFNC   - goal wean HFNC to [facility], will attempt today     OSA  Pt with known sleep apnea on sleep study. Does not use cpap.    Cardiovascular   Tachycardia  Hypotension s/p intubationLD: resolved   - normotensive on arrival, tachy improved w fluid and analgesia  - EKG no evid acs and trop flat  - addtl bolus given on arrival to MICU and peri intubation  -  levo started for MAP goals >65: OFF   - formal echo: normal   - cont home regimen asa    Renal   AKI on CKD: resolved   Metabolic acidosis: resolved   Baseline Cr around 1.8. AKI to 2.4 most likely pre-renal in setting of NV from pneumonia and illness.  -- trend I/O w foley   -- downtrend Cr since admit w volume repletion    Infectious Disease/Autoimmune   Pneumonia w hx MRSA bacteremia, cand fungemia  48 hours of chills, sob and productive cough with cxr cf rll pneumonia and leukocytosis. Most likely bacterial infection as pt on ppx  With cresemba, bactrim and valtrex. Initial lactate 2.2 down to 1.2 with IVF resusitation. Given vanc in ED and will stop vancomycin given hx of hearing loss.    - febrile, leukocytosis   - transferred to [facility] on linezolid, mero, mica >> changed mero to dapto and cefepime given past tolerance to cefepime  - cont abd regimen per ICID  - scheduled apap for fevers  - f/u cx data >> one of two blodd cx positive for GPC, known prior mrsa  - cont contact precautions for MRSA  - holding BAL for now given tenuous resp status and reasonable trach aspirate   - Will require port removal: discussed with IR and they are able to come to MICU to remove if needed.  Discuss with ID   - repeat cx q 48 hrs  -Urine histo Ag pending  -Urine legionella Ag neg   -Serum crypto Ag neg     For MRSA bacteremia + suspected MRSA PNA  ?? Daptomycin  ?? Linezolid 600mg  q12h,  ?? Port REMOVED 8/3     PPX:  -- Continue Home crescemba  -- Continue home bactrim, ss MWF  -- continue home valtrex 500mg  daily    Cultures:  Blood Culture, Routine (no units)   Date Value   11/03/2020 No Growth at 72 hours     Urine Culture, Comprehensive (no units)   Date Value   11/01/2020 NO GROWTH     Lower Respiratory Culture (no units)   Date Value   11/01/2020 3+ Methicillin resistant Staphylococcus aureus (A)     WBC (10*9/L)   Date Value   11/06/2020 9.8         History of Dress  Hx of dress on ceftaroline was on prednisone taper in may 2022, but may still have been on 15 mg/day.  Verifying with wife.    -- monitor for dress while undergoing tx with abx  - derm consult   - pred 20 mg restarted 8/1.    - stopped cefepime     FEN/GI   #NAI  - senna and miralax for bowel regimen  - reg diet        Malnutrition Assessment: Not done yet.  Body mass index is 32.08 kg/m??.  Patient does not meet AND/ASPEN criteria for malnutrition at this time (11/05/20 1324)            Heme/Coag   Ph+ B-ALL  Followed by [facility] Oncologist [name] on  C3D27 ponatinib-binatumomab running infusion upon transfer to MICU.   -- Consult Hem/Onc regarding chemotherapy/immunotherapy >> holding infusion for now, stopped 7/31  -- cont asa 81mg  daily while on ponatinib per onc recs  - oral chemo resumed 8/3. Will need new line for iv chemo. Timing per onc/ICID     Hypogammaglobulinemia  Recieves IVIG q 6 weeks. Will check Ig levels as  may benefit from IVIG in setting of infection.  --IgM, IgA, IgG    Endocrine   NAI  - accu checks q6    Integumentary   #NAI  - WOCN consulted for high risk skin assessment Yes.  - WOCN recs >> pending   - cont pressure mitigating precautions per skin policy    Prophylaxis/LDA/Restraints/Consults   Can CVC be removed? yes  Can A-line be removed? N/A, no A-line present  Can Foley be removed? N/A, no Foley present  Mobility plan: Step 4 - Out of bed assessment (sit at bedside)    Feeding: reg diet   Analgesia: Pain not adequately controlled, titrating medications  Sedation SAT/SBT: yes  Thromboembolic ppx: SQ heparin  Head of bed >30 degrees: Yes  Ulcer ppx: Not indicated  Glucose within target range: Yes, in range    Does patient need/have an active type/screen? Yes    RASS at goal? No - adjusting sedation or order to reflect need  [place] Agitation Assessment Scale (RASS) : Alert and calm (11/06/2020  4:00 AM)     Can antipsychotics be stopped? N/A, not on antipsychotics  CAM-ICU Result: CAM-ICU Negative (11/05/2020  8:00 PM)      Would hospice care be appropriate for this patient? No, patient improving or expected to improve    Patient Lines/Drains/Airways Status     Active Active Lines, Drains, & Airways     Name Placement date Placement time Site Days    Peripheral IV 11/01/20 Left Forearm 11/01/20  0330  Forearm  5    Peripheral IV 11/01/20 Anterior;Proximal;Right Forearm 11/01/20  1200  Forearm  4              Patient Lines/Drains/Airways Status     Active Wounds     None                Goals of Care     Code Status: Full Code    Designated Healthcare Decision Maker:  Mr. Ream current decisional capacity for healthcare decision-making is Full capacity. His designated Educational psychologist) is/are   HCDM (patient stated preference): [place], [place] - Spouse - [phone].      Subjective   Wants to go home           Allergies  Allergies   Allergen Reactions   ??? Bupropion Hcl Other (See Comments)     Per patient out of touch with reality, suicidal, homicidal   ??? Cefepime Rash     DRESS   ??? Ceftaroline Fosamil Rash and Other (See Comments)     Rash X 2 02/2020, suspected DRESS 06/2020   ??? 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 Onion Anaphylaxis   ??? Vancomycin Analogues      Hearing loss with Lasix  Other reaction(s): Other (See Comments)  Hearing loss  lasix   ??? Bismuth Subsalicylate Nausea And Vomiting   ??? Furosemide      With Vancomycin caused hearing loss  Other reaction(s): Other (See Comments)  With Vancomycin caused hearing loss       Meds  No current facility-administered medications on file prior to encounter.     Current Outpatient Medications on File Prior to Encounter   Medication Sig   ??? acetaminophen (TYLENOL) 325 MG tablet Take 650 mg by mouth every six (6) hours as needed for pain.    ??? amLODIPine (NORVASC) 10 MG tablet Take 1 tablet (10 mg total) by  mouth daily.   ??? aspirin 81 MG chewable tablet Chew 1 tablet (81 mg total) daily.   ??? carvediloL (COREG) 12.5 MG tablet TAKE 1 TABLET BY MOUTH TWO TIMES A DAY.   ??? clonazePAM (KLONOPIN) 0.5 MG tablet Take 1 tablet (0.5 mg total) by mouth two (2) times a day as needed for anxiety or sleep.   ??? gabapentin (NEURONTIN) 300 MG capsule Take 1 capsule (300 mg total) by mouth Three (3) times a day.   ??? hydrOXYzine (ATARAX) 25 MG tablet Take 1 tablet (25 mg total) by mouth every six (6) hours as needed.   ??? isavuconazonium sulfate (CRESEMBA) 186 mg cap capsule Take 2 capsules (372 mg total) by mouth daily.   ??? OXYCONTIN 15 mg 12 hr crush resistant ER/CR tablet TAKE 1 TABLET (15 MG TOTAL) BY MOUTH TWO (2) TIMES A DAY FOR 28 DAYS.   ??? PONATinib (ICLUSIG) 30 mg tablet Take 1 tablet (30 mg total) by mouth daily. Swallow tablets whole. Do not crush, break, cut or chew tablets. ??? sulfamethoxazole-trimethoprim (BACTRIM DS) 800-160 mg per tablet Take 1 tablet (160 mg of trimethoprim total) by mouth 2 times a day on Saturday, Sunday. For prophylaxis while on chemo.   ??? valACYclovir (VALTREX) 500 MG tablet Take 1 tablet (500 mg total) by mouth daily.   ??? nortriptyline (PAMELOR) 75 MG capsule Take 1 capsule (75 mg total) by mouth nightly.       Past Medical History  Past Medical History:   Diagnosis Date   ??? 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 BONE MARROW BIOPSY & ASPIRATION  01/21/2020        ??? IR INSERT PORT AGE GREATER THAN 5 YRS  03/10/2020    IR INSERT PORT AGE GREATER THAN 5 YRS 03/10/2020 [name], MD IMG VIR [facility]   ??? PR BRONCHOSCOPY,DIAGNOSTIC W LAVAGE Bilateral 07/20/2020    Procedure: BRONCHOSCOPY, RIGID OR FLEXIBLE, INCLUDE FLUOROSCOPIC GUIDANCE WHEN PERFORMED; W/BRONCHIAL ALVEOLAR LAVAGE WITH MODERATE SEDATION;  Surgeon: Dellis Filbert, MD;  Location: [facility];  Service: Pulmonary         Objective     Vitals - past 24 hours  Temp:  [36.5 ??C (97.7 ??F)-36.7 ??C (98.1 ??F)] 36.7 ??C (98.1 ??F)  Heart Rate:  [66-92] 66  SpO2 Pulse:  [66-91] 66  Resp:  [11-23] 17  BP: (120-155)/(67-93) 121/67  FiO2 (%):  [45 %-70 %] 50 %  SpO2:  [88 %-98 %] 98 % Intake/Output  I/O last 3 completed shifts:  In: 860 [P.O.:480; IV Piggyback:380]  Out: 2450 [Urine:2450]     Physical Exam:    General:  Awake and alert   HEENT: MMM,   CV: Soft S1,S2, no murmurs, no BLE edema  Pulm: Diminished throughout and bases w R>L, apices few scattered rhonchi, no wheezing  GI:  NTND, BS+ no rebound or guarding x near Rt CPA and chest wall  MSK: normal muslce tone  Skin: no rashes/esions, clammy   Neuro: FC, mAE     Continuous Infusions:   ??? IP okay to treat         ??? IP okay to treat         Scheduled Medications:   ??? acetaminophen  1,000 mg Oral Q8H   ??? amLODIPine  10 mg Oral Daily   ??? aspirin  81 mg Oral Daily   ??? carvediloL  12.5 mg Oral BID   ??? DAPTOmycin  10 mg/kg Intravenous Q24H   ??? enoxaparin (LOVENOX) injection  40 mg Subcutaneous QPM   ??? gabapentin  300 mg Oral TID   ??? ipratropium-albuteroL  3 mL Nebulization Q6H (RT)   ??? isavuconazonium sulfate  372 mg Oral Daily   ??? lidocaine  1 patch Transdermal Daily   ??? linezolid  600 mg Intravenous Q12H [facility]   ??? melatonin  3 mg Oral QPM   ??? nortriptyline  75 mg Oral Nightly   ??? oxyCODONE  20 mg Oral Q6H SCH   ??? polyethylene glycol  17 g Oral BID   ??? PONATinib  30 mg Oral Q24H   ??? predniSONE  20 mg Oral Daily   ??? senna  2 tablet Oral Nightly   ??? sulfamethoxazole-trimethoprim  1 tablet Oral Once per day on Mon Wed Fri   ??? valACYclovir  500 mg Oral Daily   ??? zinc sulfate  220 mg Oral Daily       PRN medications:  IP okay to treat, LORazepam, ondansetron **OR** ondansetron, oxyCODONE    Data/Imaging Review: Reviewed in Epic and personally interpreted on 11/06/2020. See EMR for detailed results.      Critical Care Attestation     This patient is critically ill or injured with the impairment of vital organ systems such that there is a high probability of imminent or life threatening deterioration in the patient's condition. This patient must remain in the ICU for ongoing evaluation of the comprehensive management plan outlined in this note. I directly provided critical care services as documented in this note and the critical care time spent (35  min) is exclusive of separately billable procedures.  In addition to time spent for critical care management, I also provided advance care planning services for 0  minutes (see ACP note for details)???. Total billable critical care time 35 minutes.    Daryel November, ACNP

## 2020-11-07 LAB — CBC W/ AUTO DIFF
BASOPHILS ABSOLUTE COUNT: 0.1 10*9/L (ref 0.0–0.1)
BASOPHILS RELATIVE PERCENT: 0.8 %
EOSINOPHILS ABSOLUTE COUNT: 0.3 10*9/L (ref 0.0–0.5)
EOSINOPHILS RELATIVE PERCENT: 3 %
HEMATOCRIT: 31.7 % — ABNORMAL LOW (ref 39.0–48.0)
HEMOGLOBIN: 10.9 g/dL — ABNORMAL LOW (ref 12.9–16.5)
LYMPHOCYTES ABSOLUTE COUNT: 1 10*9/L — ABNORMAL LOW (ref 1.1–3.6)
LYMPHOCYTES RELATIVE PERCENT: 10.4 %
MEAN CORPUSCULAR HEMOGLOBIN CONC: 34.3 g/dL (ref 32.0–36.0)
MEAN CORPUSCULAR HEMOGLOBIN: 32.8 pg — ABNORMAL HIGH (ref 25.9–32.4)
MEAN CORPUSCULAR VOLUME: 95.8 fL — ABNORMAL HIGH (ref 77.6–95.7)
MEAN PLATELET VOLUME: 7.9 fL (ref 6.8–10.7)
MONOCYTES ABSOLUTE COUNT: 0.5 10*9/L (ref 0.3–0.8)
MONOCYTES RELATIVE PERCENT: 5.3 %
NEUTROPHILS ABSOLUTE COUNT: 7.8 10*9/L (ref 1.8–7.8)
NEUTROPHILS RELATIVE PERCENT: 80.5 %
PLATELET COUNT: 227 10*9/L (ref 150–450)
RED BLOOD CELL COUNT: 3.31 10*12/L — ABNORMAL LOW (ref 4.26–5.60)
RED CELL DISTRIBUTION WIDTH: 16.2 % — ABNORMAL HIGH (ref 12.2–15.2)
WBC ADJUSTED: 9.7 10*9/L (ref 3.6–11.2)

## 2020-11-07 LAB — COMPREHENSIVE METABOLIC PANEL
ALBUMIN: 2.5 g/dL — ABNORMAL LOW (ref 3.4–5.0)
ALKALINE PHOSPHATASE: 142 U/L — ABNORMAL HIGH (ref 46–116)
ALT (SGPT): 41 U/L (ref 10–49)
ANION GAP: 5 mmol/L (ref 5–14)
AST (SGOT): 27 U/L (ref <=34)
BILIRUBIN TOTAL: 0.2 mg/dL — ABNORMAL LOW (ref 0.3–1.2)
BLOOD UREA NITROGEN: 22 mg/dL (ref 9–23)
BUN / CREAT RATIO: 14
CALCIUM: 8.8 mg/dL (ref 8.7–10.4)
CHLORIDE: 105 mmol/L (ref 98–107)
CO2: 25 mmol/L (ref 20.0–31.0)
CREATININE: 1.62 mg/dL — ABNORMAL HIGH
EGFR CKD-EPI (2021) MALE: 54 mL/min/{1.73_m2} — ABNORMAL LOW (ref >=60)
GLUCOSE RANDOM: 96 mg/dL (ref 70–179)
POTASSIUM: 4.1 mmol/L (ref 3.4–4.8)
PROTEIN TOTAL: 6.1 g/dL (ref 5.7–8.2)
SODIUM: 135 mmol/L (ref 135–145)

## 2020-11-07 LAB — PHOSPHORUS: PHOSPHORUS: 4.2 mg/dL (ref 2.4–5.1)

## 2020-11-07 LAB — MAGNESIUM: MAGNESIUM: 1.9 mg/dL (ref 1.6–2.6)

## 2020-11-07 MED ADMIN — PONATinib (ICLUSIG) tablet 30 mg **PATIENT SUPPLIED**: 30 mg | ORAL | @ 19:00:00

## 2020-11-07 MED ADMIN — gabapentin (NEURONTIN) capsule 300 mg: 300 mg | ORAL | @ 19:00:00

## 2020-11-07 MED ADMIN — oxyCODONE (ROXICODONE) immediate release tablet 20 mg: 20 mg | ORAL | Stop: 2020-11-17

## 2020-11-07 MED ADMIN — ketorolac (TORADOL) injection 15 mg: 15 mg | INTRAVENOUS | @ 15:00:00 | Stop: 2020-11-11

## 2020-11-07 MED ADMIN — carvediloL (COREG) tablet 12.5 mg: 12.5 mg | ORAL | @ 01:00:00

## 2020-11-07 MED ADMIN — ipratropium-albuteroL (DUO-NEB) 0.5-2.5 mg/3 mL nebulizer solution 3 mL: 3 mL | RESPIRATORY_TRACT | @ 02:00:00

## 2020-11-07 MED ADMIN — ipratropium-albuteroL (DUO-NEB) 0.5-2.5 mg/3 mL nebulizer solution 3 mL: 3 mL | RESPIRATORY_TRACT | @ 08:00:00 | Stop: 2020-11-07

## 2020-11-07 MED ADMIN — acetaminophen (TYLENOL) tablet 1,000 mg: 1000 mg | ORAL | @ 19:00:00 | Stop: 2020-12-01

## 2020-11-07 MED ADMIN — LORazepam (ATIVAN) tablet 0.5 mg: .5 mg | ORAL | @ 03:00:00

## 2020-11-07 MED ADMIN — predniSONE (DELTASONE) tablet 20 mg: 20 mg | ORAL | @ 13:00:00

## 2020-11-07 MED ADMIN — acetaminophen (TYLENOL) tablet 1,000 mg: 1000 mg | ORAL | @ 03:00:00 | Stop: 2020-12-01

## 2020-11-07 MED ADMIN — isavuconazonium sulfate (CRESEMBA) capsule 372 mg: 372 mg | ORAL | @ 13:00:00

## 2020-11-07 MED ADMIN — linezolid in dextrose 5% (ZYVOX) 600 mg/300 mL IVPB 600 mg: 600 mg | INTRAVENOUS | @ 13:00:00 | Stop: 2020-11-11

## 2020-11-07 MED ADMIN — enoxaparin (LOVENOX) syringe 40 mg: 40 mg | SUBCUTANEOUS | @ 22:00:00

## 2020-11-07 MED ADMIN — zinc sulfate (ZINCATE) capsule 220 mg: 220 mg | ORAL | @ 13:00:00 | Stop: 2020-11-19

## 2020-11-07 MED ADMIN — aspirin chewable tablet 81 mg: 81 mg | ORAL | @ 13:00:00

## 2020-11-07 MED ADMIN — carvediloL (COREG) tablet 12.5 mg: 12.5 mg | ORAL | @ 13:00:00

## 2020-11-07 MED ADMIN — amLODIPine (NORVASC) tablet 10 mg: 10 mg | ORAL | @ 13:00:00

## 2020-11-07 MED ADMIN — gabapentin (NEURONTIN) capsule 300 mg: 300 mg | ORAL | @ 01:00:00

## 2020-11-07 MED ADMIN — oxyCODONE (ROXICODONE) immediate release tablet 20 mg: 20 mg | ORAL | @ 16:00:00 | Stop: 2020-11-17

## 2020-11-07 MED ADMIN — oxyCODONE (ROXICODONE) immediate release tablet 20 mg: 20 mg | ORAL | @ 03:00:00 | Stop: 2020-11-17

## 2020-11-07 MED ADMIN — acetaminophen (TYLENOL) tablet 1,000 mg: 1000 mg | ORAL | @ 13:00:00 | Stop: 2020-12-01

## 2020-11-07 MED ADMIN — gabapentin (NEURONTIN) capsule 300 mg: 300 mg | ORAL | @ 13:00:00

## 2020-11-07 MED ADMIN — nortriptyline (PAMELOR) capsule 75 mg: 75 mg | ORAL | @ 01:00:00

## 2020-11-07 MED ADMIN — linezolid in dextrose 5% (ZYVOX) 600 mg/300 mL IVPB 600 mg: 600 mg | INTRAVENOUS | @ 01:00:00 | Stop: 2020-11-11

## 2020-11-07 MED ADMIN — oxyCODONE (ROXICODONE) immediate release tablet 20 mg: 20 mg | ORAL | @ 22:00:00 | Stop: 2020-11-17

## 2020-11-07 MED ADMIN — valACYclovir (VALTREX) tablet 500 mg: 500 mg | ORAL | @ 13:00:00

## 2020-11-07 MED ADMIN — oxyCODONE (ROXICODONE) immediate release tablet 20 mg: 20 mg | ORAL | @ 11:00:00 | Stop: 2020-11-17

## 2020-11-07 NOTE — Unmapped (Signed)
Pt is A&Ox4. MAP >65. O2 Sats >90%, was able to wean pt to 4L LBNC overnight. Patient used CPAP when he was asleep. Pt NSR.  Afebrile. UO adequate. Pt's appetite has been adequate. Skin intact. Contact/Chemo precautions maintained. Family at bedside. All monitors with appropriate alarm settings. Please see MAR and flowsheet for more detail. Will continue to monitor and maintain safety.    Problem: Adult Inpatient Plan of Care  Goal: Plan of Care Review  Outcome: Ongoing - Unchanged  Goal: Patient-Specific Goal (Individualized)  Outcome: Ongoing - Unchanged  Goal: Absence of Hospital-Acquired Illness or Injury  Outcome: Ongoing - Unchanged  Intervention: Identify and Manage Fall Risk  Recent Flowsheet Documentation  Taken 11/06/2020 2000 by Cora Collum, RN  Safety Interventions:   bleeding precautions   chemotherapeutic agent precautions   family at bedside  Goal: Optimal Comfort and Wellbeing  Outcome: Ongoing - Unchanged  Goal: Readiness for Transition of Care  Outcome: Ongoing - Unchanged  Goal: Rounds/Family Conference  Outcome: Ongoing - Unchanged     Problem: Infection  Goal: Absence of Infection Signs and Symptoms  Outcome: Ongoing - Unchanged     Problem: Fluid Imbalance (Pneumonia)  Goal: Fluid Balance  Outcome: Ongoing - Unchanged     Problem: Infection (Pneumonia)  Goal: Resolution of Infection Signs and Symptoms  Outcome: Ongoing - Unchanged     Problem: Respiratory Compromise (Pneumonia)  Goal: Effective Oxygenation and Ventilation  Outcome: Ongoing - Unchanged     Problem: Fall Injury Risk  Goal: Absence of Fall and Fall-Related Injury  Outcome: Ongoing - Unchanged  Intervention: Promote Injury-Free Environment  Recent Flowsheet Documentation  Taken 11/06/2020 2000 by Cora Collum, RN  Safety Interventions:   bleeding precautions   chemotherapeutic agent precautions   family at bedside     Problem: Skin Injury Risk Increased  Goal: Skin Health and Integrity  Outcome: Ongoing - Unchanged     Problem: Self-Care Deficit  Goal: Improved Ability to Complete Activities of Daily Living  Outcome: Ongoing - Unchanged     Problem: Impaired Wound Healing  Goal: Optimal Wound Healing  Outcome: Ongoing - Unchanged     Problem: Hypertension Comorbidity  Goal: Blood Pressure in Desired Range  Outcome: Ongoing - Unchanged     Problem: Obstructive Sleep Apnea Risk or Actual Comorbidity Management  Goal: Unobstructed Breathing During Sleep  Outcome: Ongoing - Unchanged

## 2020-11-07 NOTE — Unmapped (Signed)
Pt is on 4L/[place] pt's nebulizers were changed to Q6 PRN, pt stated he did not need or want the treatments.  No signs of respiratory distress

## 2020-11-07 NOTE — Unmapped (Signed)
MICU Nightshift Note     Date of Service: 11/06/2020    Principal Problem:    Pneumonia  Active Problems:    Tobacco use disorder    AKI (acute kidney injury) (CMS-HCC)    MRSA bacteremia    Recurrent major depressive disorder, in partial remission (CMS-HCC)    Immunocompromised (CMS-HCC)    Hypogammaglobulinemia (CMS-HCC)    ALL (acute lymphoblastic leukemia) (CMS-HCC)    Respiratory failure with hypoxia (CMS-HCC)    Chronic pain    Acute kidney injury superimposed on CKD (CMS-HCC)    COPD (chronic obstructive pulmonary disease) (CMS-HCC)  Resolved Problems:    * No resolved hospital problems. *      Adam 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  Course notable for ongoing hypoxemic respiratory failure requiring HFNC.    Cross cover summary       No acute events overnight.  Remained on LBNC in the evening, switched to CPAP for sleep.    [name], MD  [facility] Pulmonary and Critical Care Fellow

## 2020-11-07 NOTE — Unmapped (Signed)
WOCN Consult Services                                                                 Wound Evaluation     Reason for Consult:   - Initial  - blisters/ concerns for DRESS    Problem List:   Principal Problem:    Pneumonia  Active Problems:    Tobacco use disorder    AKI (acute kidney injury) (CMS-HCC)    MRSA bacteremia    Recurrent major depressive disorder, in partial remission (CMS-HCC)    Immunocompromised (CMS-HCC)    Hypogammaglobulinemia (CMS-HCC)    ALL (acute lymphoblastic leukemia) (CMS-HCC)    Respiratory failure with hypoxia (CMS-HCC)    Chronic pain    Acute kidney injury superimposed on CKD (CMS-HCC)    COPD (chronic obstructive pulmonary disease) (CMS-HCC)    Assessment: Adam 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  Course notable for ongoing hypoxemic respiratory failure requiring HFNC.  ??    Consult received from nursing for blistering concerns to backside and arms. Per nursing, there is a concern for DRESS and derm has been consulted.   CWOCN called the RN to discuss the following interventions. Foam to backside blisters to be left in place until DERM can eval. Optifoam and kerlix roll to arm blisters.  CWOCN team will sign off at this time. RN in agreement with plan. If there are future needs, please re consult service.       Lab Results   Component Value Date    WBC 9.7 11/07/2020    HGB 10.9 (L) 11/07/2020    HCT 31.7 (L) 11/07/2020    ESR 30 (H) 07/18/2020    CRP 15.0 (H) 10/20/2020    A1C 5.0 05/13/2020    GLU 96 11/07/2020    POCGLU 109 11/05/2020    ALBUMIN 2.5 (L) 11/07/2020    PROT 6.1 11/07/2020   WOCN Recommendations:   - Reconsult if further needs after DERM completes eval.    Workup Time:   15 minutes     Hamilton Capri, DNP, RN, AGPCNP-C, [facility]  [facility] Wound Arboriculturist   [phone]

## 2020-11-07 NOTE — Unmapped (Signed)
MICU Daily Progress Note     Date of Service: 11/07/2020    Problem List:   Principal Problem:    Pneumonia  Active Problems:    Tobacco use disorder    AKI (acute kidney injury) (CMS-HCC)    MRSA bacteremia    Recurrent major depressive disorder, in partial remission (CMS-HCC)    Immunocompromised (CMS-HCC)    Hypogammaglobulinemia (CMS-HCC)    ALL (acute lymphoblastic leukemia) (CMS-HCC)    Respiratory failure with hypoxia (CMS-HCC)    Chronic pain    Acute kidney injury superimposed on CKD (CMS-HCC)    COPD (chronic obstructive pulmonary disease) (CMS-HCC)  Resolved Problems:    * No resolved hospital problems. *      HPI: Marguis Mathieson is a 40 y.o. male with PH+ B-ALL, COPD, hospitalization 07/2020 w/ MRSA bacteremia, Candida Krusei fungemia, active smoking, sp Covid infection 09/2020 followed by [facility] ID who presented to [facility] with worsening SOB x48 hours.    History provided by patient and wife. He was in his normal state of health until 10/29/20 when he started developing worsening SOB, productive cough and chills. Coughing spells were intermittently associated with he denies sick contacts, fever, diarrhea, dysuria, polyuria, aspiration or focalizing pain. Pt endorses taking all his medicines as prescribed including antifungal and ppx meds.     Of not pt hospitalized 08/2020 for induction therapy that was complicated by Septic Shock due to MRSA and Candida krusei fungemia, acute renal failure necessitating temporary iHD and AHRB requiring intubation and stay in MICU    Pt endorses being full code status and his decision maker is his wife as listed in epic.     Interval Events:  - O2 requirement weaned nicely, now down to 4L [place]   - No acute issues     Neurological   Acute on Chronic Pain, Anxiety Followed by Palliative care  -- holding home oxycontin 15mg  bid, dilaudid 2mg  po q4 prn mod sev pain and klonipin 0.5mg  bid prn anxiety  - cont home gabapentin   -  scheduled oxy to 20 q 6 hr and add oral PRN's.    - appreciate psychiatry's assistance, added ativan 0.5 mg bid PRN     Depression  -- Continue home nortriptyline    Pulmonary   AHRF 2/2 Pneumonia with History of Intubation, tobacco abuse   Arrived in ed needing bipap placement 70%. Duonebs given 2/2 history of copd and wheezing RLL in area of consolidation. Pt down to 65% with transition to Bipap 10/5.  - amidst scheduled nebs, airway clearance and narcotic for splinting, pt progressed to needing intubation d/t inc WOB and worsening hypoxia  - cont duoneb scheduled q6  - 8/2: extubated  - LBNC 4L    OSA  Pt with known sleep apnea on sleep study. Does not use cpap.    Cardiovascular   Tachycardia  Hypotension s/p intubationLD: resolved   - normotensive on arrival, tachy improved w fluid and analgesia  - EKG no evid acs and trop flat  - addtl bolus given on arrival to MICU and peri intubation  - levo started for MAP goals >65: OFF   - formal echo: normal   - cont home regimen asa    Renal   AKI on CKD: resolved   Metabolic acidosis: resolved   Baseline Cr around 1.8. AKI to 2.4 most likely pre-renal in setting of NV from pneumonia and illness.  -- trend I/O w foley   -- stable Cr  Infectious Disease/Autoimmune   Pneumonia w hx MRSA bacteremia, cand fungemia  48 hours of chills, sob and productive cough with cxr cf rll pneumonia and leukocytosis. Most likely bacterial infection as pt on ppx  With cresemba, bactrim and valtrex. Initial lactate 2.2 down to 1.2 with IVF resusitation. Given vanc in ED and will stop vancomycin given hx of hearing loss.    - febrile, leukocytosis   - transferred to [facility] on linezolid, mero, mica >> changed mero to dapto and cefepime given past tolerance to cefepime  - cont abd regimen per ICID    For MRSA bacteremia + suspected MRSA PNA  ?? Daptomycin stopped 8/5  ?? Linezolid 600mg  q12h,  ?? Port REMOVED 8/3     PPX:  -- Continue Home crescemba  -- Continue home bactrim, ss MWF  -- continue home valtrex 500mg  daily    Cultures:  Blood Culture, Routine (no units)   Date Value   11/03/2020 No Growth at 4 days     Urine Culture, Comprehensive (no units)   Date Value   11/01/2020 NO GROWTH     Lower Respiratory Culture (no units)   Date Value   11/01/2020 3+ Methicillin resistant Staphylococcus aureus (A)     WBC (10*9/L)   Date Value   11/07/2020 9.7         History of Dress  Hx of dress on ceftaroline was on prednisone taper in may 2022, but may still have been on 15 mg/day.  Verifying with wife.    -- monitor for dress while undergoing tx with abx  - derm consult   - pred 20 mg restarted 8/1.    - stopped cefepime     FEN/GI   #NAI  - senna and miralax for bowel regimen  - reg diet        Malnutrition Assessment: Not done yet.  Body mass index is 32.08 kg/m??.  Patient does not meet AND/ASPEN criteria for malnutrition at this time (11/05/20 1324)            Heme/Coag   Ph+ B-ALL  Followed by [facility] Oncologist [name] on  [id number] ponatinib-binatumomab running infusion upon transfer to MICU.   -- Consult Hem/Onc regarding chemotherapy/immunotherapy >> holding infusion for now, stopped 7/31  -- cont asa 81mg  daily while on ponatinib per onc recs  - oral chemo resumed 8/3.  Will need new line for iv chemo. Timing per onc/ICID     Hypogammaglobulinemia  Recieves IVIG q 6 weeks. Will check Ig levels as may benefit from IVIG in setting of infection.  --IgM, IgA, IgG    Endocrine   NAI  - accu checks q6    Integumentary   #NAI  - WOCN consulted for high risk skin assessment Yes.  - WOCN recs >> pending   - cont pressure mitigating precautions per skin policy    Prophylaxis/LDA/Restraints/Consults   Can CVC be removed? yes  Can A-line be removed? N/A, no A-line present  Can Foley be removed? N/A, no Foley present  Mobility plan: Step 4 - Out of bed assessment (sit at bedside)    Feeding: reg diet   Analgesia: Pain adequately controlled  Sedation SAT/SBT: yes  Thromboembolic ppx: SQ heparin  Head of bed >30 degrees: Yes  Ulcer ppx: Not indicated  Glucose within target range: Yes, in range    Does patient need/have an active type/screen? Yes    RASS at goal? No - adjusting sedation or order to reflect need  [place] Agitation Assessment Scale (RASS) : Alert and calm (11/07/2020  8:00 AM)     Can antipsychotics be stopped? N/A, not on antipsychotics  CAM-ICU Result: CAM-ICU Negative (11/06/2020  8:00 PM)      Would hospice care be appropriate for this patient? No, patient improving or expected to improve    Patient Lines/Drains/Airways Status     Active Active Lines, Drains, & Airways     Name Placement date Placement time Site Days    Peripheral IV 11/01/20 Anterior;Proximal;Right Forearm 11/01/20  1200  Forearm  5              Patient Lines/Drains/Airways Status     Active Wounds     Name Placement date Placement time Site Days    Wound 11/06/20 Other (comment) Back Mid Blisters/excoriation/abrasion from scratching 11/06/20  0800  Back  1                Goals of Care     Code Status: Full Code    Designated Healthcare Decision Maker:  Mr. Marriott current decisional capacity for healthcare decision-making is Full capacity. His designated Educational psychologist) is/are   HCDM (patient stated preference): Ronte, Parker - Spouse - [phone].      Subjective   Wants to go home           Allergies  Allergies   Allergen Reactions   ??? Bupropion Hcl Other (See Comments)     Per patient out of touch with reality, suicidal, homicidal   ??? Cefepime Rash     DRESS   ??? Ceftaroline Fosamil Rash and Other (See Comments)     Rash X 2 02/2020, suspected DRESS 06/2020   ??? 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 Onion Anaphylaxis   ??? Vancomycin Analogues      Hearing loss with Lasix  Other reaction(s): Other (See Comments)  Hearing loss  lasix   ??? Bismuth Subsalicylate Nausea And Vomiting   ??? Furosemide      With Vancomycin caused hearing loss  Other reaction(s): Other (See Comments)  With Vancomycin caused hearing loss       Meds  No current facility-administered medications on file prior to encounter.     Current Outpatient Medications on File Prior to Encounter   Medication Sig   ??? acetaminophen (TYLENOL) 325 MG tablet Take 650 mg by mouth every six (6) hours as needed for pain.    ??? amLODIPine (NORVASC) 10 MG tablet Take 1 tablet (10 mg total) by mouth daily.   ??? aspirin 81 MG chewable tablet Chew 1 tablet (81 mg total) daily.   ??? carvediloL (COREG) 12.5 MG tablet TAKE 1 TABLET BY MOUTH TWO TIMES A DAY.   ??? clonazePAM (KLONOPIN) 0.5 MG tablet Take 1 tablet (0.5 mg total) by mouth two (2) times a day as needed for anxiety or sleep.   ??? gabapentin (NEURONTIN) 300 MG capsule Take 1 capsule (300 mg total) by mouth Three (3) times a day.   ??? hydrOXYzine (ATARAX) 25 MG tablet Take 1 tablet (25 mg total) by mouth every six (6) hours as needed.   ??? isavuconazonium sulfate (CRESEMBA) 186 mg cap capsule Take 2 capsules (372 mg total) by mouth daily.   ??? OXYCONTIN 15 mg 12 hr crush resistant ER/CR tablet TAKE 1 TABLET (15 MG TOTAL) BY MOUTH TWO (2) TIMES A DAY FOR 28 DAYS.   ??? PONATinib (ICLUSIG) 30 mg tablet  Take 1 tablet (30 mg total) by mouth daily. Swallow tablets whole. Do not crush, break, cut or chew tablets.   ??? sulfamethoxazole-trimethoprim (BACTRIM DS) 800-160 mg per tablet Take 1 tablet (160 mg of trimethoprim total) by mouth 2 times a day on Saturday, Sunday. For prophylaxis while on chemo.   ??? valACYclovir (VALTREX) 500 MG tablet Take 1 tablet (500 mg total) by mouth daily.   ??? nortriptyline (PAMELOR) 75 MG capsule Take 1 capsule (75 mg total) by mouth nightly.       Past Medical History  Past Medical History:   Diagnosis Date   ??? 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 BONE MARROW BIOPSY & ASPIRATION  01/21/2020        ??? IR INSERT PORT AGE GREATER THAN 5 YRS  03/10/2020    IR INSERT PORT AGE GREATER THAN 5 YRS 03/10/2020 [name], MD IMG VIR [facility]   ??? PR BRONCHOSCOPY,DIAGNOSTIC W LAVAGE Bilateral 07/20/2020    Procedure: BRONCHOSCOPY, RIGID OR FLEXIBLE, INCLUDE FLUOROSCOPIC GUIDANCE WHEN PERFORMED; W/BRONCHIAL ALVEOLAR LAVAGE WITH MODERATE SEDATION;  Surgeon: Dellis Filbert, MD;  Location: BRON[facility] PROCEDURE LAB [facility];  Service: Pulmonary         Objective     Vitals - past 24 hours  Temp:  [36.3 ??C (97.3 ??F)-36.7 ??C (98.1 ??F)] 36.5 ??C (97.7 ??F)  Heart Rate:  [66-94] 72  SpO2 Pulse:  [65-99] 71  Resp:  [14-27] 20  BP: (115-161)/(57-86) 126/59  FiO2 (%):  [40 %] 40 %  SpO2:  [92 %-98 %] 92 % Intake/Output  I/O last 3 completed shifts:  In: 1110 [P.O.:490; I.V.:20; IV Piggyback:600]  Out: 1950 [Urine:1950]     Physical Exam:    General:  Awake and alert   HEENT: MMM,   CV: Soft S1,S2, no murmurs, no BLE edema  Pulm: Diminished throughout and bases w R>L, apices few scattered rhonchi, no wheezing  GI:  NTND, BS+ no rebound or guarding x near Rt CPA and chest wall  MSK: normal muslce tone  Skin: no rashes/esions, clammy   Neuro: FC, mAE     Continuous Infusions:   ??? IP okay to treat         ??? IP okay to treat         Scheduled Medications:   ??? acetaminophen  1,000 mg Oral Q8H   ??? amLODIPine  10 mg Oral Daily   ??? aspirin  81 mg Oral Daily   ??? carvediloL  12.5 mg Oral BID   ??? enoxaparin (LOVENOX) injection  40 mg Subcutaneous QPM   ??? gabapentin  300 mg Oral TID   ??? ipratropium-albuteroL  3 mL Nebulization Q6H (RT)   ??? isavuconazonium sulfate  372 mg Oral Daily   ??? linezolid  600 mg Intravenous Q12H [facility]   ??? melatonin  3 mg Oral QPM   ??? nortriptyline  75 mg Oral Nightly   ??? oxyCODONE  20 mg Oral Q6H SCH   ??? polyethylene glycol  17 g Oral BID   ??? PONATinib  30 mg Oral Q24H   ??? predniSONE  20 mg Oral Daily   ??? senna  2 tablet Oral Nightly   ??? sulfamethoxazole-trimethoprim  1 tablet Oral Once per day on Mon Wed Fri   ??? valACYclovir  500 mg Oral Daily   ??? zinc  sulfate  220 mg Oral Daily       PRN medications:  IP okay to treat, ketorolac, LORazepam, ondansetron **OR** ondansetron, oxyCODONE    Data/Imaging Review: Reviewed in Epic and personally interpreted on 11/07/2020. See EMR for detailed results.      Daryel November, ACNP

## 2020-11-08 LAB — COMPREHENSIVE METABOLIC PANEL
ALBUMIN: 2.7 g/dL — ABNORMAL LOW (ref 3.4–5.0)
ALKALINE PHOSPHATASE: 152 U/L — ABNORMAL HIGH (ref 46–116)
ALT (SGPT): 46 U/L (ref 10–49)
ANION GAP: 4 mmol/L — ABNORMAL LOW (ref 5–14)
AST (SGOT): 32 U/L (ref <=34)
BILIRUBIN TOTAL: <0.2 mg/dL — ABNORMAL LOW (ref 0.3–1.2)
BLOOD UREA NITROGEN: 21 mg/dL (ref 9–23)
BUN / CREAT RATIO: 14
CALCIUM: 9.3 mg/dL (ref 8.7–10.4)
CHLORIDE: 105 mmol/L (ref 98–107)
CO2: 25 mmol/L (ref 20.0–31.0)
CREATININE: 1.53 mg/dL — ABNORMAL HIGH
EGFR CKD-EPI (2021) MALE: 58 mL/min/{1.73_m2} — ABNORMAL LOW (ref >=60)
GLUCOSE RANDOM: 105 mg/dL (ref 70–179)
POTASSIUM: 4.4 mmol/L (ref 3.4–4.8)
PROTEIN TOTAL: 6.2 g/dL (ref 5.7–8.2)
SODIUM: 134 mmol/L — ABNORMAL LOW (ref 135–145)

## 2020-11-08 LAB — MAGNESIUM: MAGNESIUM: 1.7 mg/dL (ref 1.6–2.6)

## 2020-11-08 LAB — CBC W/ AUTO DIFF
BASOPHILS ABSOLUTE COUNT: 0.2 10*9/L — ABNORMAL HIGH (ref 0.0–0.1)
BASOPHILS RELATIVE PERCENT: 1.5 %
EOSINOPHILS ABSOLUTE COUNT: 0.3 10*9/L (ref 0.0–0.5)
EOSINOPHILS RELATIVE PERCENT: 2.7 %
HEMATOCRIT: 32.7 % — ABNORMAL LOW (ref 39.0–48.0)
HEMOGLOBIN: 10.9 g/dL — ABNORMAL LOW (ref 12.9–16.5)
LYMPHOCYTES ABSOLUTE COUNT: 1.2 10*9/L (ref 1.1–3.6)
LYMPHOCYTES RELATIVE PERCENT: 10.9 %
MEAN CORPUSCULAR HEMOGLOBIN CONC: 33.2 g/dL (ref 32.0–36.0)
MEAN CORPUSCULAR HEMOGLOBIN: 31.5 pg (ref 25.9–32.4)
MEAN CORPUSCULAR VOLUME: 94.9 fL (ref 77.6–95.7)
MEAN PLATELET VOLUME: 8.1 fL (ref 6.8–10.7)
MONOCYTES ABSOLUTE COUNT: 0.4 10*9/L (ref 0.3–0.8)
MONOCYTES RELATIVE PERCENT: 3.5 %
NEUTROPHILS ABSOLUTE COUNT: 9.1 10*9/L — ABNORMAL HIGH (ref 1.8–7.8)
NEUTROPHILS RELATIVE PERCENT: 81.4 %
PLATELET COUNT: 239 10*9/L (ref 150–450)
RED BLOOD CELL COUNT: 3.45 10*12/L — ABNORMAL LOW (ref 4.26–5.60)
RED CELL DISTRIBUTION WIDTH: 16.3 % — ABNORMAL HIGH (ref 12.2–15.2)
WBC ADJUSTED: 11.2 10*9/L (ref 3.6–11.2)

## 2020-11-08 LAB — PHOSPHORUS: PHOSPHORUS: 4.2 mg/dL (ref 2.4–5.1)

## 2020-11-08 LAB — SLIDE REVIEW

## 2020-11-08 MED ADMIN — enoxaparin (LOVENOX) syringe 40 mg: 40 mg | SUBCUTANEOUS | @ 22:00:00

## 2020-11-08 MED ADMIN — gabapentin (NEURONTIN) capsule 300 mg: 300 mg | ORAL | @ 20:00:00

## 2020-11-08 MED ADMIN — oxyCODONE (ROXICODONE) immediate release tablet 20 mg: 20 mg | ORAL | @ 16:00:00 | Stop: 2020-11-17

## 2020-11-08 MED ADMIN — oxyCODONE (ROXICODONE) immediate release tablet 20 mg: 20 mg | ORAL | @ 08:00:00 | Stop: 2020-11-17

## 2020-11-08 MED ADMIN — linezolid in dextrose 5% (ZYVOX) 600 mg/300 mL IVPB 600 mg: 600 mg | INTRAVENOUS | @ 13:00:00 | Stop: 2020-11-08

## 2020-11-08 MED ADMIN — HYDROmorphone (DILAUDID) tablet 2 mg: 2 mg | ORAL | @ 21:00:00 | Stop: 2020-11-08

## 2020-11-08 MED ADMIN — PONATinib (ICLUSIG) tablet 30 mg **PATIENT SUPPLIED**: 30 mg | ORAL | @ 20:00:00

## 2020-11-08 MED ADMIN — LORazepam (ATIVAN) tablet 0.5 mg: .5 mg | ORAL

## 2020-11-08 MED ADMIN — carvediloL (COREG) tablet 12.5 mg: 12.5 mg | ORAL

## 2020-11-08 MED ADMIN — carvediloL (COREG) tablet 12.5 mg: 12.5 mg | ORAL | @ 13:00:00

## 2020-11-08 MED ADMIN — isavuconazonium sulfate (CRESEMBA) capsule 372 mg: 372 mg | ORAL | @ 13:00:00

## 2020-11-08 MED ADMIN — oxyCODONE (ROXICODONE) immediate release tablet 20 mg: 20 mg | ORAL | @ 22:00:00 | Stop: 2020-11-17

## 2020-11-08 MED ADMIN — aspirin chewable tablet 81 mg: 81 mg | ORAL | @ 13:00:00

## 2020-11-08 MED ADMIN — zinc sulfate (ZINCATE) capsule 220 mg: 220 mg | ORAL | @ 13:00:00 | Stop: 2020-11-19

## 2020-11-08 MED ADMIN — linezolid in dextrose 5% (ZYVOX) 600 mg/300 mL IVPB 600 mg: 600 mg | INTRAVENOUS | Stop: 2020-11-11

## 2020-11-08 MED ADMIN — gabapentin (NEURONTIN) capsule 300 mg: 300 mg | ORAL | @ 13:00:00

## 2020-11-08 MED ADMIN — oxyCODONE (ROXICODONE) immediate release tablet 20 mg: 20 mg | ORAL | @ 04:00:00 | Stop: 2020-11-17

## 2020-11-08 MED ADMIN — acetaminophen (TYLENOL) tablet 1,000 mg: 1000 mg | ORAL | @ 20:00:00 | Stop: 2020-12-01

## 2020-11-08 MED ADMIN — DAPTOmycin (CUBICIN) 1,150 mg in sodium chloride (NS) 0.9 % 50 mL IVPB: 1150 mg | INTRAVENOUS | @ 22:00:00 | Stop: 2020-11-18

## 2020-11-08 MED ADMIN — acetaminophen (TYLENOL) tablet 1,000 mg: 1000 mg | ORAL | @ 13:00:00 | Stop: 2020-12-01

## 2020-11-08 MED ADMIN — oxyCODONE (ROXICODONE) immediate release tablet 20 mg: 20 mg | ORAL | @ 19:00:00 | Stop: 2020-11-17

## 2020-11-08 MED ADMIN — oxyCODONE (ROXICODONE) immediate release tablet 20 mg: 20 mg | ORAL | @ 10:00:00 | Stop: 2020-11-17

## 2020-11-08 MED ADMIN — predniSONE (DELTASONE) tablet 20 mg: 20 mg | ORAL | @ 13:00:00

## 2020-11-08 MED ADMIN — LORazepam (ATIVAN) tablet 0.5 mg: .5 mg | ORAL | @ 16:00:00

## 2020-11-08 MED ADMIN — HYDROmorphone (DILAUDID) tablet 2 mg: 2 mg | ORAL | @ 02:00:00 | Stop: 2020-11-07

## 2020-11-08 MED ADMIN — valACYclovir (VALTREX) tablet 500 mg: 500 mg | ORAL | @ 13:00:00

## 2020-11-08 MED ADMIN — nortriptyline (PAMELOR) capsule 75 mg: 75 mg | ORAL

## 2020-11-08 MED ADMIN — gabapentin (NEURONTIN) capsule 300 mg: 300 mg | ORAL

## 2020-11-08 MED ADMIN — amLODIPine (NORVASC) tablet 10 mg: 10 mg | ORAL | @ 13:00:00

## 2020-11-08 MED ADMIN — acetaminophen (TYLENOL) tablet 1,000 mg: 1000 mg | ORAL | @ 04:00:00 | Stop: 2020-12-01

## 2020-11-08 NOTE — Unmapped (Signed)
IMMUNOCOMPROMISED HOST INFECTIOUS DISEASE PROGRESS NOTE    Assessment/Plan:     Adam [name] with PMH of B-ALL on chemo, prior MRSA bacteremia and PNA requiring ICU care 01/2020, prior Candida krusei fungemia, DRESS to ceftaroline, and prior COVID infection, who presented with PNA and MRSA bacteremia and was intubated and admitted to the ICU, now extubated and clinically improved.    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  - 4/21 BMBx normocellular bone marrow 50% w/ 1% blasts (MRD)  - Extent of disease/CNS involvement: rare blast on prior CSF, intrathecal??ppx (cytarabine, methotrexate, hydrocortisone) last 09/18/2020  - Cancer-related complications: TLS, hyperbilirubinemia, MRSA bacteremia w/ septic emboli/renal failure+dialysis/intubation, C. krusei fungemia  - Prior chemotherapy: [id number] induction with dasatinib (vincristine, dexamethasone and dasatinib); C1D1 01/24/2020; difficulty tolerating single-agent dasatinib  - Current chemotherapy: blinatumomab (anti-CD19/CD3), C1D1 = 3/18 (symtomatic, titrated up dose and restarted 3/21) + ponatinib (TKI)  - currently cycle 3/5  -??Right IJ Port-A-Cath??placed 04/15/2020, removed 11/04/2020  - Infection prophylaxis prior to admission: Bactrim 1 DS MWF, valacyclovir, s/p Evusheld 04/21/2020 x2 doses, isavuconazole  - on pred 15mg  daily PTA as below  ??  Likely DIHS/DRESS, 06/12/2020 and new rash 8/01  - followed by Dr. Caryn Section, dermatology  - 3/11 punch bx  - per prior ID notes possible culprits ceftaroline, posaconazole, dasatinib, sotrovimab  - received high dose steroids in 06/2020, 3/3 - 3/7 pred 50 x5d; 06/19/20 started at 50 mg prednisone taper  -??flare on pred 10 mg daily  - pink rash noted on chest on 8/01, with rising eosinophils. Dermatology consulted and noted concern for DRESS flare, so cefepime discontinued and started on prednisone  - 8/02 improvement in rash and resolution in eosinophilia  - 8/03 itching at baseline, but rising eosinophils  - 8/07 itching at baseline, eosinophils within normal limits  ??  Pertinent Co-morbidities  None  ??  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  Infection History  Active infections:??  #MRSA bacteremia, 11/01/2020  #RLL pulmonary consolidation on 11/01/2020 CXR, new from 7/28 CT  - 7/31 BCx 1/2 Staph aureus  - 7/31 LRCx Staph aureus  - 8/01 TTE without evidence of endocarditis  - S/p Port-a-cath removal on 8/03  Rx: 7/31 linezolid/cefepime --> linezolid/daptomycin/cefepime -->8/02 linezolid/daptomycin  ??  #Non-neutropenic fever and new pulmonary nodules/infiltrates, 07/17/20  - 4/15 BC x2 NGTD  - 4/16 CT Chest: multifocal somewhat nodular consolidative opacities in b/l lungs that are relatively new, most concerning for infectious PNA (possibly fungal, especially given his recent high dose steroid taper)  - 4/17 LRCx +2??MRSA  - 4/17 Legionella uAg, serum crypto Ag, serum Asp galactomannan, urine histo ag, B-D-glucan (Fungitell), serum CMV neg  - 4/18 bronch with ZOX:WRUE cx + MRSA, OP flora,??RPP negative, Legionella cx, AFB cx, fungal cx, CMV PCR, PJP DFA, Asp galactomannan neg  - 7/28 CT chest resolved  Rx:  4/15 cefepime/dapto --> 4/16 cefepime --> 4/18 cefepime/isavu/linezolid -> 4/19 linezolid --> 4/21 isavuconazole/linezolid --> 5/10 isavuconazole -->  - Plan to complete 7 days of treatment with linezolid for MRSA PNA, and 14 days of daptomycin for MRSA bacteremia, pending culture clearance??    #Intertrigo in groin, 11/01/2020  ??  Prior infections:  ??  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  #COVID-19 Pneumonia, pos 05/28/2020 and 09/21/2020  -  vaccinated 02/2020, 03/2020  - s/p Evusheld x1 04/21/2020  - prescribed molnupiravir at home, per prior notes did not take  - admitted 2/27 - 06/01/20 for observation, given sotrovimab  ??  #MRSA bacteremia + PNA + TV IE 01/27/20  - bacteremic 10/25-11/6, cleared 11/8  - pyomyositis thoracic spine, L arm  - 10/29 DISRUPT trial, exebacase v. Placebo  Tx: 10/25 vanc/cefepime -->??10/27 dapto/ceftaroline -->??11/5 ceftaroline/vanc ??->??11/16 vanc (stopped due to ototoxicity though other meds were more likely) -->Ceftaroline (w/ 1 dose DAP) 11/28 ->12/02-->Vanc 12/02 - ?  ??  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  #tree-in-bud nodularity, resolving on CT 04/28/20  ??  Antimicrobial Intolerance/allergy  ??  Cefepime - rash with concern for DRESS flare  Ceftaroline - DRESS  Dapsone - possible agranulocytosis, per chart anaphylaxis  Vancomycin - probable ototoxicity (in combination with furosemide)  Isavuconazole - elevated LFTs in the setting of TKI       RECOMMENDATIONS FOR 11/08/2020    Diagnostic  ?? Follow up 8/03 catheter tip culture  ??  Antibiotic toxicity monitoring  ?? At least weekly CBC w/ diff, CMP, CK    Treatment  For MRSA bacteremia + suspected MRSA PNA  ?? CONT Daptomycin 10mg /kg (dosing per pharmacy) for 14 day course from date of port removal (EOT 8/17) for treatment of bacteremia  ?? Stop linezolid 600mg  q12h, given completion of 7 day course (EOT 8/07) for treatment of PNA  ??  For intertrigo  ?? Topical antifungal, clotrimazole or nystatin powder, to groin  ??  For hx pulmonary nodules  ?? CONT isavuconazole 372mg  daily  ??  For history of DRESS and new rash  ?? Avoid cephalosporins  ?? Ok to increase steroids if concern for DRESS  ??  Prophylaxis  ?? CONT valacyclovir  ?? CONT Bactrim 1 DS tab MWF  ?? CONT isavuconazole  ??  Immunizations  ?? COVID-19 booster prior to discharge  ?? Prevnar-20  ?? Shingrix  All can be given prior to discharge          Hematologic Malignancies and BMT Infectious Diseases Follow-up Instructions  - Appointment: pending, message sent to clinic scheduler  - Location: 2nd Floor, [facility],[address], [place], [place]  - Labs: weekly CBC with differential, CMP, CK   - Please fax labs to primary oncologist???s nurse navigator  - Antibiotics:  Daptomycin 10mg /kg (dosing per pharmacy) for 14 day course (EOT 8/17)    The [facility] ID service will sign off, but please feel free to reach out with any questions!  Please page the ID Transplant/Liquid Oncology Fellow consult at [phone] with questions.  Patient discussed with Dr. Para March.    Sarita Haver, MSPH, MD  Fellow, [facility] Division of Infectious Diseases    Subjective:     Interval History: Feeling well this morning, and anxious to go home. Still with mild cough and pain in R side. He has not had subjective fevers, chills, nausea, vomiting, or diarrhea. Rash has resolved completely, and back itchiness is at baseline.  History obtained from:patient and nurse.    Medications:  Antimicrobials:  Anti-infectives (From admission, onward)    Start     Dose/Rate Route Frequency Ordered Stop    11/08/20 1500  DAPTOmycin (CUBICIN) 1,150 mg in sodium chloride (NS) 0.9 % 50 mL IVPB         1,150 mg  160 mL/hr over 30 Minutes Intravenous Every 24 hours 11/08/20 1439 11/18/20 1459    11/06/20 0900  sulfamethoxazole-trimethoprim (BACTRIM DS) 800-160 mg tablet 160 mg of trimethoprim         1 tablet Oral Once per day on Mon Wed Fri 11/06/20 0757 12/02/20 0859    11/06/20 0900  valACYclovir (VALTREX) tablet 500 mg         500 mg Oral Daily (standard) 11/06/20 0757      11/01/20 0900  isavuconazonium sulfate (CRESEMBA) capsule 372 mg         372 mg Oral Daily (standard) 11/01/20 0531            Prior/Current immunomodulators: prednisone 20 mg daily, blinatumomab (anti-CD19/CD3)    Other medications reviewed.    Objective:     Vital Signs last 24 hours:  Temp:  [35.9 ??C (96.7 ??F)-37.4 ??C (99.3 ??F)] 36.2 ??C (97.1 ??F)  Heart Rate:  [65-89] 89  SpO2 Pulse:  [65-88] 88  Resp:  [11-21] 21  BP: (116-158)/(58-92) 152/87  MAP (mmHg):  [74-110] 108  FiO2 (%):  [36 %-40 %] 40 %  SpO2:  [89 %-99 %] 92 %    Physical Exam:  Patient Lines/Drains/Airways Status     Active Active Lines, Drains, & Airways     Name Placement date Placement time Site Days    Peripheral IV 11/01/20 Anterior;Proximal;Right Forearm 11/01/20  1200  Forearm  7              GEN:  looks well, no apparent distress  EYES: sclerae anicteric and non injected  ZOX:WRUEAVWUJ good and moist mucous membranes  LYMPH:no cervical or supraclavicular LAD  CV:RRR, no abnormal heart sounds noted and no peripheral edema  PULM:normal work of breathing at rest, TTP on R lower lateral chest  WJ:XBJY, NTND  NW:GNFAOZHY  RECTAL:deferred  SKIN:erythema on back, resolution of chest erythema, no raised lesions  MSK:no swollen joints  NEURO:no tremor noted, facial expression symmetric and moves extremities equally  PSYCH:interactive    Data for Medical Decision Making     Recent Labs   Lab Units 11/08/20  0422 11/07/20  0458 11/06/20  0429 11/05/20  0415 11/04/20  0418 11/01/20  1646 11/01/20  1547   WBC 10*9/L 11.2 9.7 9.8 8.8 12.1*   < >  --    HEMOGLOBIN g/dL 86.5* 78.4* 69.6* 9.9* 10.1*   < >  --    HEMOGLOBIN BG   --   --   --   --   --    < >  --    PLATELET COUNT (1) 10*9/L 239 227 218 188 191   < >  --    NEUTRO ABS 10*9/L 9.1* 7.8 7.7 7.1 10.5*   < >  --    LYMPHO ABS 10*9/L 1.2 1.0* 1.1 0.6* 0.5*   < >  --    EOSINO ABS 10*9/L 0.3 0.3 0.5 0.6* 0.6*   < >  --    BUN mg/dL 21 22 23  27* 28*   < > 36*   CREATININE mg/dL 2.95* 2.84* 1.32* 4.40* 1.66*   < > 2.44*   AST U/L 32 27 19 18 19    < >  --    ALT U/L 46 41 28 28 24    < >  --    BILIRUBIN TOTAL mg/dL <1.0* 0.2* 0.2* 0.2* 0.2*   < >  --    ALK PHOS U/L 152* 142* 127* 110 106   < >  --    POTASSIUM WHOLE BLOOD   --   --   --   --   --    < >  --  POTASSIUM mmol/L 4.4 4.1 4.2 3.6 3.7   < > 4.8   MAGNESIUM mg/dL 1.7 1.9 1.9 1.9 2.0   < > 1.3*   PHOSPHORUS mg/dL 4.2 4.2 3.4 3.4 2.7   < > 4.8   CALCIUM mg/dL 9.3 8.8 9.0 8.7 8.8   < > 7.5*   CK TOTAL U/L  --   --  22.0*  --   --   --  38.0*    < > = values in this interval not displayed.       New Micro Data  7/31 BCx MRSA in 1/2   7/31 LRCx MRSA  7/31 UCx NG  8/01 urine legionella Ag negative  8/01 SCrAg negative  8/01 urine histo Ag pending  8/02 BCx NG  8/06 Catheter tip Cx NG    Recent Studies  CXR 7/30:  Patchy opacification along the medial right lung base with asymmetrical elevation of the hemidiaphragm, may reflect atelectasis, however, developing infectious process (e.g. pneumonia) is not entirely excluded.    CXR 7/31:  1.Interval placement of an endotracheal tube with tip terminating above the level of the carina.  2.Suspect small-to-moderate bilateral pleural effusions with associated passive atelectasis of adjacent lung parenchyma (right greater than left).  3.Additional findings suggestive of mild interstitial pulmonary edema, as above.    Abdominal XR 8/01:  Esophagogastric tube in appropriate positioning.    TTE 8/01:    1. No evidence of valvular or endocardial vegetation.    2. The left ventricle is normal in size with normal wall thickness.    3. The left ventricular systolic function is normal, LVEF is visually  estimated at > 55%.    4. The right ventricle is normal in size, with normal systolic function.    PVL VENOUS DUPLEX LOWER EXTREMITY BILATERAL 8/01:  Right  There is no evidence of DVT in the lower extremity. There is no evidence of obstruction proximal to the inguinal ligament or in the common femoral vein.  Left  There is no evidence of DVT in the lower extremity. There is no evidence of obstruction proximal to the inguinal ligament or in the common femoral vein.    PVL VENOUS DUPLEX UPPER EXTREMITY BILATERAL 8/01:  Right  No evidence of DVT detected in the central veins or arm veins.  Left  No evidence of DVT detected in the central veins or arm veins.     CXR 8/02:   No acute changes.    CXR 8/02 PM:  Persistent bibasilar consolidation right greater than left. Endotracheal tube removed.    CXR 8/05:  Portacatheter is been removed. No pneumothorax or soft tissue emphysema. Decreased airspace opacities. Increased right effusion.

## 2020-11-08 NOTE — Unmapped (Signed)
MICU Daily Progress Note     Date of Service: 11/08/2020    Problem List:   Principal Problem:    Pneumonia  Active Problems:    Tobacco use disorder    AKI (acute kidney injury) (CMS-HCC)    MRSA bacteremia    Recurrent major depressive disorder, in partial remission (CMS-HCC)    Immunocompromised (CMS-HCC)    Hypogammaglobulinemia (CMS-HCC)    ALL (acute lymphoblastic leukemia) (CMS-HCC)    Respiratory failure with hypoxia (CMS-HCC)    Chronic pain    Acute kidney injury superimposed on CKD (CMS-HCC)    COPD (chronic obstructive pulmonary disease) (CMS-HCC)  Resolved Problems:    * No resolved hospital problems. *      HPI: Adam [name] with PH+ B-ALL, COPD, hospitalization 07/2020 w/ MRSA bacteremia, Candida Krusei fungemia, active smoking, sp Covid infection 09/2020 followed by [facility] who presented to [facility] with worsening SOB x48 hours.    History provided by patient and wife. He was in his normal state of health until 10/29/20 when he started developing worsening SOB, productive cough and chills. Coughing spells were intermittently associated with he denies sick contacts, fever, diarrhea, dysuria, polyuria, aspiration or focalizing pain. Pt endorses taking all his medicines as prescribed including antifungal and ppx meds.     Of not pt hospitalized 08/2020 for induction therapy that was complicated by Septic Shock due to MRSA and Candida krusei fungemia, acute renal failure necessitating temporary iHD and AHRB requiring intubation and stay in MICU    Pt endorses being full code status and his decision maker is his wife as listed in epic.     Interval Events:  - Now down to 4L [place]   - No acute issues     Neurological   Acute on Chronic Pain, Anxiety Followed by Palliative care  -- holding home oxycontin 15mg  bid, dilaudid 2mg  po q4 prn mod sev pain and klonipin 0.5mg  bid prn anxiety  - cont home gabapentin   -  scheduled oxy to 20 q 6 hr and add oral PRN's.    - appreciate psychiatry's assistance, added ativan 0.5 mg bid PRN  - given additional PO dilaudid overnight    Depression  -- Continue home nortriptyline    Pulmonary   AHRF 2/2 Pneumonia with History of Intubation, tobacco abuse   Arrived in ed needing bipap placement 70%. Duonebs given 2/2 history of copd and wheezing RLL in area of consolidation. Pt down to 65% with transition to Bipap 10/5.  - amidst scheduled nebs, airway clearance and narcotic for splinting, pt progressed to needing intubation d/t inc WOB and worsening hypoxia  - cont duoneb scheduled q6  - 8/2: extubated  - LBNC 4L    OSA  Pt with known sleep apnea on sleep study. Does not use cpap.  - has been using one here     Cardiovascular   Tachycardia  Hypotension s/p intubationLD: resolved   - normotensive on arrival, tachy improved w fluid and analgesia  - EKG no evid acs and trop flat  - addtl bolus given on arrival to MICU and peri intubation  - levo started for MAP goals >65: OFF   - formal echo: normal   - cont home regimen asa    Renal   AKI on CKD: resolved   Metabolic acidosis: resolved   Baseline Cr around 1.8. AKI to 2.4 most likely pre-renal in setting of NV from pneumonia and illness.  --  trend I/O w foley   -- stable Cr     Infectious Disease/Autoimmune   Pneumonia w hx MRSA bacteremia, cand fungemia  48 hours of chills, sob and productive cough with cxr cf rll pneumonia and leukocytosis. Most likely bacterial infection as pt on ppx  With cresemba, bactrim and valtrex. Initial lactate 2.2 down to 1.2 with IVF resusitation. Given vanc in ED and will stop vancomycin given hx of hearing loss.    - febrile, leukocytosis   - transferred to [facility] on linezolid, mero, mica >> changed mero to dapto and cefepime given past tolerance to cefepime  - cont abd regimen per ICID    For MRSA bacteremia + suspected MRSA PNA  ?? Daptomycin stopped 8/5  ?? Linezolid 600mg  q12h,  ?? Port REMOVED 8/3     PPX:  -- Continue Home crescemba  -- Continue home bactrim, ss MWF  -- continue home valtrex 500mg  daily    Cultures:  Blood Culture, Routine (no units)   Date Value   11/03/2020 No Growth at 5 days     Urine Culture, Comprehensive (no units)   Date Value   11/01/2020 NO GROWTH     Lower Respiratory Culture (no units)   Date Value   11/01/2020 3+ Methicillin resistant Staphylococcus aureus (A)     WBC (10*9/L)   Date Value   11/08/2020 11.2         History of Dress  Hx of dress on ceftaroline was on prednisone taper in may 2022, but may still have been on 15 mg/day.  Verifying with wife.    -- monitor for dress while undergoing tx with abx  - derm consult   - pred 20 mg restarted 8/1.    - stopped cefepime     FEN/GI   #NAI  - senna and miralax for bowel regimen  - reg diet        Malnutrition Assessment: Not done yet.  Body mass index is 32.08 kg/m??.  Patient does not meet AND/ASPEN criteria for malnutrition at this time (11/05/20 1324)            Heme/Coag   Ph+ B-ALL  Followed by [facility] Oncologist [name] on  C3D27 ponatinib-binatumomab running infusion upon transfer to MICU.   -- Consult Hem/Onc regarding chemotherapy/immunotherapy >> holding infusion for now, stopped 7/31  -- cont asa 81mg  daily while on ponatinib per onc recs  - oral chemo resumed 8/3.  Will need new line for iv chemo. Timing per onc/ICID     Hypogammaglobulinemia  Recieves IVIG q 6 weeks. Will check Ig levels as may benefit from IVIG in setting of infection.  --IgM, IgA, IgG    Endocrine   NAI  - accu checks q6    Integumentary   #NAI  - WOCN consulted for high risk skin assessment Yes.  - WOCN recs >> pending   - cont pressure mitigating precautions per skin policy    Prophylaxis/LDA/Restraints/Consults   Can CVC be removed? yes  Can A-line be removed? N/A, no A-line present  Can Foley be removed? N/A, no Foley present  Mobility plan: Step 4 - Out of bed assessment (sit at bedside)    Feeding: reg diet   Analgesia: Pain adequately controlled  Sedation SAT/SBT: yes  Thromboembolic ppx: SQ heparin  Head of bed >30 degrees: Yes  Ulcer ppx: Not indicated  Glucose within target range: Yes, in range    Does patient need/have an active type/screen? Yes  RASS at goal? No - adjusting sedation or order to reflect need  [place] Agitation Assessment Scale (RASS) : Alert and calm (11/08/2020  8:00 AM)     Can antipsychotics be stopped? N/A, not on antipsychotics  CAM-ICU Result: CAM-ICU Negative (11/08/2020  8:00 AM)      Would hospice care be appropriate for this patient? No, patient improving or expected to improve    Patient Lines/Drains/Airways Status     Active Active Lines, Drains, & Airways     Name Placement date Placement time Site Days    Peripheral IV 11/01/20 Anterior;Proximal;Right Forearm 11/01/20  1200  Forearm  6              Patient Lines/Drains/Airways Status     Active Wounds     Name Placement date Placement time Site Days    Wound 11/06/20 Other (comment) Back Mid Blisters/excoriation/abrasion from scratching 11/06/20  0800  Back  2                Goals of Care     Code Status: Full Code    Designated Healthcare Decision Maker:  Mr. Brouillard current decisional capacity for healthcare decision-making is Full capacity. His designated Educational psychologist) is/are   HCDM (patient stated preference): Deadrian, Toya - Spouse - [phone].      Subjective   Wants to go home           Allergies  Allergies   Allergen Reactions   ??? Bupropion Hcl Other (See Comments)     Per patient out of touch with reality, suicidal, homicidal   ??? Cefepime Rash     DRESS   ??? Ceftaroline Fosamil Rash and Other (See Comments)     Rash X 2 02/2020, suspected DRESS 06/2020   ??? 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 Onion Anaphylaxis   ??? Vancomycin Analogues      Hearing loss with Lasix  Other reaction(s): Other (See Comments)  Hearing loss  lasix   ??? Bismuth Subsalicylate Nausea And Vomiting   ??? Furosemide      With Vancomycin caused hearing loss  Other reaction(s): Other (See Comments)  With Vancomycin caused hearing loss       Meds  No current facility-administered medications on file prior to encounter.     Current Outpatient Medications on File Prior to Encounter   Medication Sig   ??? acetaminophen (TYLENOL) 325 MG tablet Take 650 mg by mouth every six (6) hours as needed for pain.    ??? amLODIPine (NORVASC) 10 MG tablet Take 1 tablet (10 mg total) by mouth daily.   ??? aspirin 81 MG chewable tablet Chew 1 tablet (81 mg total) daily.   ??? carvediloL (COREG) 12.5 MG tablet TAKE 1 TABLET BY MOUTH TWO TIMES A DAY.   ??? clonazePAM (KLONOPIN) 0.5 MG tablet Take 1 tablet (0.5 mg total) by mouth two (2) times a day as needed for anxiety or sleep.   ??? gabapentin (NEURONTIN) 300 MG capsule Take 1 capsule (300 mg total) by mouth Three (3) times a day.   ??? hydrOXYzine (ATARAX) 25 MG tablet Take 1 tablet (25 mg total) by mouth every six (6) hours as needed.   ??? isavuconazonium sulfate (CRESEMBA) 186 mg cap capsule Take 2 capsules (372 mg total) by mouth daily.   ??? OXYCONTIN 15 mg 12 hr crush resistant ER/CR tablet TAKE 1 TABLET (15 MG TOTAL) BY MOUTH TWO (2) TIMES  A DAY FOR 28 DAYS.   ??? PONATinib (ICLUSIG) 30 mg tablet Take 1 tablet (30 mg total) by mouth daily. Swallow tablets whole. Do not crush, break, cut or chew tablets.   ??? sulfamethoxazole-trimethoprim (BACTRIM DS) 800-160 mg per tablet Take 1 tablet (160 mg of trimethoprim total) by mouth 2 times a day on Saturday, Sunday. For prophylaxis while on chemo.   ??? valACYclovir (VALTREX) 500 MG tablet Take 1 tablet (500 mg total) by mouth daily.   ??? nortriptyline (PAMELOR) 75 MG capsule Take 1 capsule (75 mg total) by mouth nightly.       Past Medical History  Past Medical History:   Diagnosis Date   ??? 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 BONE MARROW BIOPSY & ASPIRATION  01/21/2020        ??? IR INSERT PORT AGE GREATER THAN 5 YRS  03/10/2020    IR INSERT PORT AGE GREATER THAN 5 YRS 03/10/2020 [name], MD IMG VIR [facility]   ??? PR BRONCHOSCOPY,DIAGNOSTIC W LAVAGE Bilateral 07/20/2020    Procedure: BRONCHOSCOPY, RIGID OR FLEXIBLE, INCLUDE FLUOROSCOPIC GUIDANCE WHEN PERFORMED; W/BRONCHIAL ALVEOLAR LAVAGE WITH MODERATE SEDATION;  Surgeon: Dellis Filbert, MD;  Location: BRONCH PROCEDURE LAB [facility];  Service: Pulmonary         Objective     Vitals - past 24 hours  Temp:  [35.9 ??C (96.7 ??F)-37.4 ??C (99.3 ??F)] 35.9 ??C (96.7 ??F)  Heart Rate:  [65-85] 77  SpO2 Pulse:  [65-90] 69  Resp:  [13-24] 15  BP: (105-163)/(58-92) 157/92  FiO2 (%):  [36 %-40 %] 40 %  SpO2:  [89 %-99 %] 92 % Intake/Output  I/O last 3 completed shifts:  In: 1950 [P.O.:1020; I.V.:30; IV Piggyback:900]  Out: 2275 [Urine:2275]     Physical Exam:    General:  Awake and alert   HEENT: MMM,   CV: Soft S1,S2, no murmurs, no BLE edema  Pulm: Diminished throughout and bases w R>L, apices few scattered rhonchi, no wheezing  GI:  NTND, BS+ no rebound or guarding x near Rt CPA and chest wall  MSK: normal muslce tone  Skin: no rashes/esions, clammy   Neuro: FC, mAE     Continuous Infusions:   ??? IP okay to treat         ??? IP okay to treat         Scheduled Medications:   ??? acetaminophen  1,000 mg Oral Q8H   ??? amLODIPine  10 mg Oral Daily   ??? aspirin  81 mg Oral Daily   ??? carvediloL  12.5 mg Oral BID   ??? enoxaparin (LOVENOX) injection  40 mg Subcutaneous QPM   ??? gabapentin  300 mg Oral TID   ??? isavuconazonium sulfate  372 mg Oral Daily   ??? linezolid  600 mg Intravenous Q12H [facility]   ??? melatonin  3 mg Oral QPM   ??? nortriptyline  75 mg Oral Nightly   ??? oxyCODONE  20 mg Oral Q6H SCH   ??? polyethylene glycol  17 g Oral BID   ??? PONATinib  30 mg Oral Q24H   ??? predniSONE  20 mg Oral Daily   ??? senna  2 tablet Oral Nightly   ??? sulfamethoxazole-trimethoprim  1 tablet Oral Once per day on Mon Wed Fri   ??? valACYclovir  500 mg Oral Daily   ???  zinc sulfate  220 mg Oral Daily       PRN medications:  IP okay to treat, ipratropium-albuteroL, ketorolac, LORazepam, ondansetron **OR** ondansetron, oxyCODONE    Data/Imaging Review: Reviewed in Epic and personally interpreted on 11/08/2020. See EMR for detailed results.      Daryel November, ACNP

## 2020-11-08 NOTE — Unmapped (Signed)
PHYSICAL THERAPY  Evaluation (11/08/20 1124)  Seen with Henrietta Hoover, OT        Patient Name:?? Adam Keith????????   Medical Record Number: [id number]   Date of Birth: [birth year]  Sex: Male??  ??    Treatment Diagnosis: decreased endurance     Activity Tolerance: Tolerated treatment well     ASSESSMENT  Problem List: Decreased endurance      Assessment : Tremont Gavitt is a 40 y.o. 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 Pt presents to PT with decreased endurance, however demonstrates the ability to transfer and ambulate with independence and no device, steady with no LOB. Pt expresses no concerns re: returning home and safely negotiating home environment. Reports he has home O2 for use PRN and has a pulse ox. Will d/c acute PT at this time. No f/u PT or DME needs anticipated. After a review of the personal factors, comorbidities, clinical presentation, and examination of the number of affected body systems, the patient presents as a low complexity case.       Today's Interventions: Eval, ther act, transfers and ambulation, pt/caregiver ed: role of PT, pacing, symptom monitoring/management, all questions answered                            PLAN  Planned Frequency of Treatment:?? D/C Services for: D/C Services          Post-Discharge Physical Therapy Recommendations:?? PT services not indicated     PT DME Recommendations: None??????????       Goals:   Patient and Family Goals: to get discharged today            Prognosis:?? Excellent  Positive Indicators: PLOF/CLOF  Barriers to Discharge: None     SUBJECTIVE  Patient reports: Pt/RN agreeable to PT. All this is for is to make sure I don't pass out when I get up  Current Functional Status: Pt presents and session concluded with pt sitting EOB, all needs in reach, family present, RN aware.     Prior Functional Status: Indep wtih all mobility and ADL, driving. Has O2 at home but hasn't had to use it.  Equipment available at home: Shower chair with back, Bedside commode, Rolling Walker, Oxygen      Past Medical History:   Diagnosis Date   ??? 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 Smoking status: Former Smoker     Packs/day: 2.00     Types: Cigarettes     Quit date: 01/20/2020     Years since quitting: 0.8   ??? Smokeless tobacco: Former Neurosurgeon     Types: Chew   Substance Use Topics   ??? Alcohol use: Not on file       Past Surgical History:   Procedure Laterality Date   ??? BONE MARROW BIOPSY & ASPIRATION  01/21/2020        ??? IR INSERT PORT AGE GREATER THAN 5 YRS  03/10/2020    IR INSERT PORT AGE GREATER THAN 5 YRS 03/10/2020 [name], MD IMG  VIR H&V [facility]   ??? PR BRONCHOSCOPY,DIAGNOSTIC W LAVAGE Bilateral 07/20/2020    Procedure: BRONCHOSCOPY, RIGID OR FLEXIBLE, INCLUDE FLUOROSCOPIC GUIDANCE WHEN PERFORMED; W/BRONCHIAL ALVEOLAR LAVAGE WITH MODERATE SEDATION;  Surgeon: Dellis Filbert, MD;  Location: BRONCH PROCEDURE LAB [facility];  Service: Pulmonary             Family History   Problem Relation Age of Onset   ??? Melanoma Neg Hx    ??? Basal cell carcinoma Neg Hx    ??? Squamous cell carcinoma Neg Hx         Allergies: Bupropion hcl, Cefepime, Ceftaroline fosamil, Dapsone, Onion, Vancomycin analogues, Bismuth subsalicylate, and Furosemide                  Objective Findings  Precautions / Restrictions  Precautions: Isolation precautions, Falls precautions (contact)  Weight Bearing Status: Non-applicable  Required Braces or Orthoses: Non-applicable     Communication Preference: Verbal          Pain Comments: pt reported well controlled pain  Medical Tests / Procedures: vitals, labs, orders reviewed in Epic  Equipment / Environment: Vascular access (PIV, TLC, Port-a-cath, PICC), Telemetry, Supplemental oxygen, Patient not wearing mask for full session, Caregiver wearing mask for full session (2L LBNC)     At Rest: HR 78, SPO2 95% on 2.5L LBNC  With Activity: SpO2 91% on 2L LBNC  Orthostatics: sitting 169/94, standing 152/87. Asymptomatic  Airway Clearance: mobility/sitting EOB     Living Situation  Living Environment: House  Lives With: Spouse  Home Living: One level home, Stairs to enter with rails, Walk-in shower, Grab bars in shower, Shower chair with back, Standard height toilet  Rail placement (outside): Bilateral rails  Number of Stairs to Enter (outside): 5      Cognition: [facility]        Skin Inspection: Intact where visualized     Upper Extremities  UE ROM: Right WFL, Left WFL  UE Strength: Right WFL, Left WFL    Lower Extremities  LE ROM: Right WFL, Left WFL  LE Strength: Right WFL, Left WFL     Balance comment: static sitting indep, static standing indep with no device      Bed Mobility: NT 2/2 presents sitting EOB     Transfers: Sit to Stand  Transfer comments: sit<>stand indep with no device      Gait Level of Assistance: Independent  Gait Assistive Device: None  Gait Distance Ambulated (ft): 400 ft  Gait: amb ~400 ft indep with no device, steady with no LOB     Stairs: performed high knee marching indep with no UE support x6 marches; demonstrates functional strength necessary for stair negotiation            Endurance: decreased from baseline (currently requiring 2L LBNC, no baseline requirement), however reports he has home O2 for use PRN     Physical Therapy Session Duration  PT Individual [mins]: 16     Medical Staff Made Aware: RN     I attest that I have reviewed the above information.  Signed: Aldean Ast, PT  Filed 11/08/2020

## 2020-11-08 NOTE — Unmapped (Signed)
PICC LINE TRIAGE NOTE    The Venous Access Team has received an order for PICC placement.  This patient has been triaged and  VAT will attempt bedside placement as schedule permits . Discussed patient with Nephrology in light of h/o CKD; renal approved by L. Sims (Nephrology).      Thank You,    Jacqulyn Liner RN Venous Access Team [phone]      Workup Time:  30 minutes

## 2020-11-08 NOTE — Unmapped (Signed)
OCCUPATIONAL THERAPY  Evaluation (11/08/20 1125)    Patient Name:  Adam Keith       Medical Record Number: [id number]   Date of Birth: [birth year]  Sex: Male          OT Treatment Diagnosis:  Functional endurance deficits    Adam 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  Course notable for ongoing hypoxemic respiratory failure requiring HFNC.    Assessment  Problem List: Decreased endurance  Clinical Decision Making: Low  Assessment: Pt presents to acute OT demonstrating overall modified independence with ADL and functional mobility. 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 He has a shower chair w/ back at home for seated bathing as needed. Pt has no further skilled acute or post-acute OT needs and no DME needs.  Today's Interventions: EDUCATION: OT role, POC, importance of supplemental O2 use in shower if indicated, encouraged spot checking SpO2 at home.    Activity Tolerance During Today's Session  Tolerated treatment well    Plan  Planned Frequency of Treatment:  D/C Services for: D/C Services       Planned Interventions:       Post-Discharge Occupational Therapy Recommendations:   OT services not indicated   OT DME Recommendations: None -        GOALS:   Patient and Family Goals: I want to go home.                  Prognosis:  Good  Positive Indicators:  PLOF, CLOF, motivation, cognition, social support, DME at home, experience with post-acute rehab  Barriers to Discharge: None    Subjective  Current Status Pt rec'd and left sitting EOB, call bell in reach, wife present, RN aware  Prior Functional Status Independent with ADL and functional mobility. Drives.    Medical Tests / Procedures: Reviewed imaging       Patient / Caregiver reports: I won't need oxygen at home. I know when to sit and rest if I'm short of breath.    Past Medical History:   Diagnosis Date   ??? 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 Smoking status: Former Smoker     Packs/day: 2.00     Types: Cigarettes     Quit date: 01/20/2020     Years since quitting: 0.8   ??? Smokeless tobacco: Former Neurosurgeon     Types: Chew   Substance Use Topics   ??? Alcohol use: Not on file      Past Surgical History:   Procedure Laterality Date   ??? BONE MARROW BIOPSY & ASPIRATION  01/21/2020        ??? IR INSERT PORT AGE GREATER THAN 5 YRS  03/10/2020    IR INSERT PORT AGE GREATER THAN 5 YRS 03/10/2020 Adam Gibbon, MD IMG [facility]   ??? PR BRONCHOSCOPY,DIAGNOSTIC W LAVAGE Bilateral 07/20/2020    Procedure: BRONCHOSCOPY, RIGID OR FLEXIBLE, INCLUDE FLUOROSCOPIC GUIDANCE WHEN PERFORMED; W/BRONCHIAL ALVEOLAR LAVAGE  WITH MODERATE SEDATION;  Surgeon: Adam Filbert, MD;  Location: BRONCH PROCEDURE LAB [facility];  Service: Pulmonary    Family History   Problem Relation Age of Onset   ??? Melanoma Neg Hx    ??? Basal cell carcinoma Neg Hx    ??? Squamous cell carcinoma Neg Hx         Bupropion hcl, Cefepime, Ceftaroline fosamil, Dapsone, Onion, Vancomycin analogues, Bismuth subsalicylate, and Furosemide     Objective Findings  Precautions / Restrictions  Isolation precautions, Falls precautions (contact)    Weight Bearing  Non-applicable    Required Braces or Orthoses  Non-applicable    Communication Preference  Verbal, Visual    Pain  Pt reported well-controlled pain, not quantified    Equipment / Environment  Vascular access (PIV, TLC, Port-a-cath, PICC), Telemetry, Supplemental oxygen, Patient not wearing mask for full session, Caregiver wearing mask for full session (2L LBNC)    Living Situation  Living Environment: House  Lives With: Spouse  Home Living: One level home, Stairs to enter with rails, Walk-in shower, Grab bars in shower, Shower chair with back, Standard height toilet  Rail placement (outside): Bilateral rails  Number of Stairs to Enter (outside): 5  Equipment available at home: Shower chair with back, Bedside commode, Rolling Walker, Oxygen     Cognition   Orientation Level:  Oriented x 4   Arousal/Alertness:  Appropriate responses to stimuli   Attention Span:  Appears intact (limited by frustration 2/2 recent news he cannot DC today)   Memory:  Appears intact   Following Commands:  Follows all commands and directions without difficulty   Safety Judgment:  Good awareness of safety precautions   Awareness of Errors:  Good awareness of errors made   Problem Solving:  Able to problem solve independently   Comments:      Vision / Hearing         Hearing: No deficit identified         Hand Function:  Right Hand Function: Right hand grip strength, ROM and coordination WNL  Left Hand Function: Left hand grip strength, ROM and coordination WNL    Skin Inspection:  Skin Inspection: Intact where visualized (back and UE blisters/wounds not visualized)    ROM / Strength:  UE ROM/Strength: Right WFL, Left WFL  LE ROM/Strength: Right WFL, Left WFL (AROM testing for figure 4 deferred 2/2 pt frustrated by recent news and preferring to complete mobility)    Coordination:  Coordination: WFL    Sensation:       Balance:  Independent in sitting and standing    Functional Mobility  Transfer Assistance Needed: No  Bed Mobility Assistance Needed: No  Ambulation: Independent for mobility in room and hall.      ADLs  ADLs: Modified Independent  IADLs: mod (I)      Vitals / Orthostatics  At Rest: HR 78, SpO2 95%, BP 169/94  With Activity: SpO2 low of 91% on 2L LBNC toward the end of hall level mobility  Orthostatics: Standing BP 152/87      Medical Staff Made Aware: RN Adam Keith      Occupational Therapy Session Duration  OT Individual [mins]: 15 (c PT Adam Keith)  Reason for Co-treatment: Poor pain control         I attest that I have reviewed the above information.  Signed: Bernarda Caffey, OT  Filed 11/08/2020

## 2020-11-08 NOTE — Unmapped (Signed)
MICU Nightshift Note     Date of Service: 11/07/2020    Principal Problem:    Pneumonia  Active Problems:    Tobacco use disorder    AKI (acute kidney injury) (CMS-HCC)    MRSA bacteremia    Recurrent major depressive disorder, in partial remission (CMS-HCC)    Immunocompromised (CMS-HCC)    Hypogammaglobulinemia (CMS-HCC)    ALL (acute lymphoblastic leukemia) (CMS-HCC)    Respiratory failure with hypoxia (CMS-HCC)    Chronic pain    Acute kidney injury superimposed on CKD (CMS-HCC)    COPD (chronic obstructive pulmonary disease) (CMS-HCC)  Resolved Problems:    * No resolved hospital problems. *      Adam 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  Course notable for ongoing hypoxemic respiratory failure requiring HFNC.    Cross cover summary       No acute events overnight.  Having pain over right side of chest, gave one-time dose of dilaudid 2mg  PO.    [name], MD  [facility] Pulmonary and Critical Care Fellow

## 2020-11-08 NOTE — Unmapped (Signed)
VSS this shift. Pain management ongoing - patient states, I got toradol today, but it didn't do anything for the pain. One time dose dilaudid ordered and given, with improvement in patient pain score from 8/10 to 7/10. Pain otherwise managed with scheduled and PRN doses of oxycodone, see MAR and flowsheets for times and assessments.    Otherwise uneventful shift. See flowsheets for further detail.      Problem: Adult Inpatient Plan of Care  Goal: Plan of Care Review  Outcome: Ongoing - Unchanged  Goal: Patient-Specific Goal (Individualized)  Outcome: Ongoing - Unchanged  Goal: Absence of Hospital-Acquired Illness or Injury  Outcome: Ongoing - Unchanged  Intervention: Identify and Manage Fall Risk  Recent Flowsheet Documentation  Taken 11/07/2020 2000 by Marguerite Olea, RN  Safety Interventions: chemotherapeutic agent precautions  Intervention: Prevent Skin Injury  Recent Flowsheet Documentation  Taken 11/07/2020 2000 by Marguerite Olea, RN  Skin Protection: adhesive use limited  Intervention: Prevent and Manage VTE (Venous Thromboembolism) Risk  Recent Flowsheet Documentation  Taken 11/08/2020 0600 by Marguerite Olea, RN  Activity Management: activity adjusted per tolerance  Taken 11/08/2020 0400 by Marguerite Olea, RN  Activity Management: activity adjusted per tolerance  Taken 11/08/2020 0200 by Marguerite Olea, RN  Activity Management: activity adjusted per tolerance  Taken 11/08/2020 0000 by Marguerite Olea, RN  Activity Management: activity adjusted per tolerance  Taken 11/07/2020 2200 by Marguerite Olea, RN  Activity Management: activity adjusted per tolerance  Taken 11/07/2020 2000 by Marguerite Olea, RN  Activity Management: activity adjusted per tolerance  VTE Prevention/Management: anticoagulant therapy  Range of Motion: Bilateral Upper and Lower Extremities  Intervention: Prevent Infection  Recent Flowsheet Documentation  Taken 11/07/2020 2000 by Marguerite Olea, RN  Infection Prevention:   cohorting utilized single patient room provided  Goal: Optimal Comfort and Wellbeing  Outcome: Ongoing - Unchanged  Goal: Readiness for Transition of Care  Outcome: Ongoing - Unchanged  Goal: Rounds/Family Conference  Outcome: Ongoing - Unchanged     Problem: Infection  Goal: Absence of Infection Signs and Symptoms  Outcome: Ongoing - Unchanged  Intervention: Prevent or Manage Infection  Recent Flowsheet Documentation  Taken 11/08/2020 0600 by Marguerite Olea, RN  Isolation Precautions: contact precautions maintained  Taken 11/08/2020 0400 by Marguerite Olea, RN  Isolation Precautions: contact precautions maintained  Taken 11/08/2020 0200 by Marguerite Olea, RN  Isolation Precautions: contact precautions maintained  Taken 11/08/2020 0000 by Marguerite Olea, RN  Isolation Precautions: contact precautions maintained  Taken 11/07/2020 2200 by Marguerite Olea, RN  Isolation Precautions: contact precautions maintained  Taken 11/07/2020 2000 by Marguerite Olea, RN  Infection Management: aseptic technique maintained  Isolation Precautions: contact precautions maintained     Problem: Fluid Imbalance (Pneumonia)  Goal: Fluid Balance  Outcome: Ongoing - Unchanged     Problem: Infection (Pneumonia)  Goal: Resolution of Infection Signs and Symptoms  Outcome: Ongoing - Unchanged  Intervention: Prevent Infection Progression  Recent Flowsheet Documentation  Taken 11/08/2020 0600 by Marguerite Olea, RN  Isolation Precautions: contact precautions maintained  Taken 11/08/2020 0400 by Marguerite Olea, RN  Isolation Precautions: contact precautions maintained  Taken 11/08/2020 0200 by Marguerite Olea, RN  Isolation Precautions: contact precautions maintained  Taken 11/08/2020 0000 by Marguerite Olea, RN  Isolation Precautions: contact precautions maintained  Taken 11/07/2020 2200 by Marguerite Olea, RN  Isolation Precautions: contact precautions maintained  Taken 11/07/2020 2000 by Marguerite Olea, RN  Infection Management: aseptic technique maintained  Isolation Precautions: contact precautions maintained     Problem: Respiratory Compromise (Pneumonia)  Goal: Effective Oxygenation and Ventilation  Outcome: Ongoing - Unchanged  Intervention: Optimize Oxygenation and Ventilation  Recent Flowsheet Documentation  Taken 11/08/2020 0600 by Marguerite Olea, RN  Head of Bed [place]) Positioning: HOB at 30-45 degrees  Taken 11/08/2020 0400 by Marguerite Olea, RN  Head of Bed [facility]) Positioning: HOB at 30-45 degrees  Taken 11/08/2020 0200 by Marguerite Olea, RN  Head of Bed [facility]) Positioning: HOB at 30 degrees  Taken 11/08/2020 0000 by Marguerite Olea, RN  Head of Bed [facility]) Positioning: HOB at 45 degrees  Taken 11/07/2020 2200 by Marguerite Olea, RN  Head of Bed [facility]) Positioning: HOB at 45 degrees  Taken 11/07/2020 2000 by Marguerite Olea, RN  Head of Bed [facility]) Positioning: HOB at 45 degrees     Problem: Fall Injury Risk  Goal: Absence of Fall and Fall-Related Injury  Outcome: Ongoing - Unchanged  Intervention: Identify and Manage Contributors  Recent Flowsheet Documentation  Taken 11/07/2020 2000 by Marguerite Olea, RN  Self-Care Promotion: independence encouraged  Intervention: Promote Injury-Free Environment  Recent Flowsheet Documentation  Taken 11/07/2020 2000 by Marguerite Olea, RN  Safety Interventions: chemotherapeutic agent precautions     Problem: Skin Injury Risk Increased  Goal: Skin Health and Integrity  Outcome: Ongoing - Unchanged  Intervention: Optimize Skin Protection  Recent Flowsheet Documentation  Taken 11/08/2020 0600 by Marguerite Olea, RN  Head of Bed [facility]) Positioning: HOB at 30-45 degrees  Taken 11/08/2020 0400 by Marguerite Olea, RN  Head of Bed [facility]) Positioning: HOB at 30-45 degrees  Taken 11/08/2020 0200 by Marguerite Olea, RN  Head of Bed [facility]) Positioning: HOB at 30 degrees  Taken 11/08/2020 0000 by Marguerite Olea, RN  Head of Bed [facility]) Positioning: HOB at 45 degrees  Taken 11/07/2020 2200 by Marguerite Olea, RN  Head of Bed [facility]) Positioning: HOB at 45 degrees  Taken 11/07/2020 2000 by Marguerite Olea, RN  Pressure Reduction Techniques: frequent weight shift encouraged  Head of Bed (HOB) Positioning: HOB at 45 degrees  Pressure Reduction Devices: pressure-redistributing mattress utilized  Skin Protection: adhesive use limited     Problem: Self-Care Deficit  Goal: Improved Ability to Complete Activities of Daily Living  Outcome: Ongoing - Unchanged  Intervention: Promote Activity and Functional Independence  Recent Flowsheet Documentation  Taken 11/07/2020 2000 by Marguerite Olea, RN  Self-Care Promotion: independence encouraged     Problem: Impaired Wound Healing  Goal: Optimal Wound Healing  Outcome: Ongoing - Unchanged  Intervention: Promote Wound Healing  Recent Flowsheet Documentation  Taken 11/08/2020 0600 by Marguerite Olea, RN  Activity Management: activity adjusted per tolerance  Taken 11/08/2020 0400 by Marguerite Olea, RN  Activity Management: activity adjusted per tolerance  Taken 11/08/2020 0200 by Marguerite Olea, RN  Activity Management: activity adjusted per tolerance  Taken 11/08/2020 0000 by Marguerite Olea, RN  Activity Management: activity adjusted per tolerance  Taken 11/07/2020 2200 by Marguerite Olea, RN  Activity Management: activity adjusted per tolerance  Taken 11/07/2020 2000 by Marguerite Olea, RN  Activity Management: activity adjusted per tolerance  Sleep/Rest Enhancement: awakenings minimized     Problem: Hypertension Comorbidity  Goal: Blood Pressure in Desired Range  Outcome: Ongoing - Unchanged     Problem: Obstructive Sleep Apnea Risk or Actual Comorbidity Management  Goal: Unobstructed Breathing During Sleep  Outcome: Ongoing - Unchanged

## 2020-11-09 DIAGNOSIS — C9101 Acute lymphoblastic leukemia, in remission: Principal | ICD-10-CM

## 2020-11-09 LAB — CBC W/ AUTO DIFF
BASOPHILS ABSOLUTE COUNT: 0.2 10*9/L — ABNORMAL HIGH (ref 0.0–0.1)
BASOPHILS RELATIVE PERCENT: 1.3 %
EOSINOPHILS ABSOLUTE COUNT: 0.2 10*9/L (ref 0.0–0.5)
EOSINOPHILS RELATIVE PERCENT: 1.8 %
HEMATOCRIT: 35.8 % — ABNORMAL LOW (ref 39.0–48.0)
HEMOGLOBIN: 11.9 g/dL — ABNORMAL LOW (ref 12.9–16.5)
LYMPHOCYTES ABSOLUTE COUNT: 2.1 10*9/L (ref 1.1–3.6)
LYMPHOCYTES RELATIVE PERCENT: 15.8 %
MEAN CORPUSCULAR HEMOGLOBIN CONC: 33.2 g/dL (ref 32.0–36.0)
MEAN CORPUSCULAR HEMOGLOBIN: 31.8 pg (ref 25.9–32.4)
MEAN CORPUSCULAR VOLUME: 95.8 fL — ABNORMAL HIGH (ref 77.6–95.7)
MEAN PLATELET VOLUME: 8 fL (ref 6.8–10.7)
MONOCYTES ABSOLUTE COUNT: 0.6 10*9/L (ref 0.3–0.8)
MONOCYTES RELATIVE PERCENT: 4.5 %
NEUTROPHILS ABSOLUTE COUNT: 10 10*9/L — ABNORMAL HIGH (ref 1.8–7.8)
NEUTROPHILS RELATIVE PERCENT: 76.6 %
PLATELET COUNT: 265 10*9/L (ref 150–450)
RED BLOOD CELL COUNT: 3.74 10*12/L — ABNORMAL LOW (ref 4.26–5.60)
RED CELL DISTRIBUTION WIDTH: 15.9 % — ABNORMAL HIGH (ref 12.2–15.2)
WBC ADJUSTED: 13.1 10*9/L — ABNORMAL HIGH (ref 3.6–11.2)

## 2020-11-09 LAB — COMPREHENSIVE METABOLIC PANEL
ALBUMIN: 3 g/dL — ABNORMAL LOW (ref 3.4–5.0)
ALKALINE PHOSPHATASE: 175 U/L — ABNORMAL HIGH (ref 46–116)
ALT (SGPT): 53 U/L — ABNORMAL HIGH (ref 10–49)
ANION GAP: 4 mmol/L — ABNORMAL LOW (ref 5–14)
AST (SGOT): 30 U/L (ref <=34)
BILIRUBIN TOTAL: 0.2 mg/dL — ABNORMAL LOW (ref 0.3–1.2)
BLOOD UREA NITROGEN: 17 mg/dL (ref 9–23)
BUN / CREAT RATIO: 11
CALCIUM: 9.4 mg/dL (ref 8.7–10.4)
CHLORIDE: 105 mmol/L (ref 98–107)
CO2: 26 mmol/L (ref 20.0–31.0)
CREATININE: 1.56 mg/dL — ABNORMAL HIGH
EGFR CKD-EPI (2021) MALE: 57 mL/min/{1.73_m2} — ABNORMAL LOW (ref >=60)
GLUCOSE RANDOM: 83 mg/dL (ref 70–179)
POTASSIUM: 4.7 mmol/L (ref 3.4–4.8)
PROTEIN TOTAL: 6.8 g/dL (ref 5.7–8.2)
SODIUM: 135 mmol/L (ref 135–145)

## 2020-11-09 LAB — PHOSPHORUS: PHOSPHORUS: 4.4 mg/dL (ref 2.4–5.1)

## 2020-11-09 LAB — MAGNESIUM: MAGNESIUM: 1.7 mg/dL (ref 1.6–2.6)

## 2020-11-09 MED ORDER — PREDNISONE 20 MG TABLET
ORAL_TABLET | Freq: Every day | ORAL | 0 refills | 21 days | Status: CP
Start: 2020-11-09 — End: 2020-11-30

## 2020-11-09 MED ADMIN — sulfamethoxazole-trimethoprim (BACTRIM DS) 800-160 mg tablet 160 mg of trimethoprim: 1 | ORAL | @ 13:00:00 | Stop: 2020-12-02

## 2020-11-09 MED ADMIN — acetaminophen (TYLENOL) tablet 1,000 mg: 1000 mg | ORAL | @ 03:00:00 | Stop: 2020-12-01

## 2020-11-09 MED ADMIN — LORazepam (ATIVAN) tablet 0.5 mg: .5 mg | ORAL | @ 03:00:00

## 2020-11-09 MED ADMIN — gabapentin (NEURONTIN) capsule 300 mg: 300 mg | ORAL | @ 18:00:00

## 2020-11-09 MED ADMIN — isavuconazonium sulfate (CRESEMBA) capsule 372 mg: 372 mg | ORAL | @ 13:00:00

## 2020-11-09 MED ADMIN — zinc sulfate (ZINCATE) capsule 220 mg: 220 mg | ORAL | @ 13:00:00 | Stop: 2020-11-19

## 2020-11-09 MED ADMIN — oxyCODONE (ROXICODONE) immediate release tablet 20 mg: 20 mg | ORAL | @ 16:00:00 | Stop: 2020-11-17

## 2020-11-09 MED ADMIN — diphenhydrAMINE (BENADRYL) injection 25 mg: 25 mg | INTRAVENOUS | @ 20:00:00

## 2020-11-09 MED ADMIN — oxyCODONE (ROXICODONE) immediate release tablet 20 mg: 20 mg | ORAL | @ 13:00:00 | Stop: 2020-11-17

## 2020-11-09 MED ADMIN — acetaminophen (TYLENOL) tablet 1,000 mg: 1000 mg | ORAL | @ 13:00:00 | Stop: 2020-12-01

## 2020-11-09 MED ADMIN — sodium chloride (NS) 0.9 % flush 10 mL: 10 mL | INTRAVENOUS | @ 16:00:00

## 2020-11-09 MED ADMIN — oxyCODONE (ROXICODONE) immediate release tablet 20 mg: 20 mg | ORAL | @ 03:00:00 | Stop: 2020-11-17

## 2020-11-09 MED ADMIN — acetaminophen (TYLENOL) tablet 1,000 mg: 1000 mg | ORAL | @ 23:00:00 | Stop: 2020-12-01

## 2020-11-09 MED ADMIN — enoxaparin (LOVENOX) syringe 40 mg: 40 mg | SUBCUTANEOUS | @ 23:00:00

## 2020-11-09 MED ADMIN — predniSONE (DELTASONE) tablet 20 mg: 20 mg | ORAL | @ 13:00:00

## 2020-11-09 MED ADMIN — meperidine (DEMEROL) injection 25 mg: 25 mg | INTRAVENOUS | @ 20:00:00 | Stop: 2020-11-23

## 2020-11-09 MED ADMIN — sodium chloride (NS) 0.9 % flush 10 mL: 10 mL | INTRAVENOUS | @ 17:00:00

## 2020-11-09 MED ADMIN — oxyCODONE (ROXICODONE) immediate release tablet 20 mg: 20 mg | ORAL | @ 23:00:00 | Stop: 2020-11-17

## 2020-11-09 MED ADMIN — carvediloL (COREG) tablet 12.5 mg: 12.5 mg | ORAL | @ 13:00:00

## 2020-11-09 MED ADMIN — oxyCODONE (ROXICODONE) immediate release tablet 20 mg: 20 mg | ORAL | @ 11:00:00 | Stop: 2020-11-17

## 2020-11-09 MED ADMIN — oxyCODONE (ROXICODONE) immediate release tablet 20 mg: 20 mg | ORAL | @ 01:00:00 | Stop: 2020-11-17

## 2020-11-09 MED ADMIN — amLODIPine (NORVASC) tablet 10 mg: 10 mg | ORAL | @ 13:00:00

## 2020-11-09 MED ADMIN — gabapentin (NEURONTIN) capsule 300 mg: 300 mg | ORAL | @ 01:00:00

## 2020-11-09 MED ADMIN — gabapentin (NEURONTIN) capsule 300 mg: 300 mg | ORAL | @ 13:00:00

## 2020-11-09 MED ADMIN — nortriptyline (PAMELOR) capsule 75 mg: 75 mg | ORAL | @ 01:00:00

## 2020-11-09 MED ADMIN — aspirin chewable tablet 81 mg: 81 mg | ORAL | @ 13:00:00

## 2020-11-09 MED ADMIN — melatonin tablet 3 mg: 3 mg | ORAL | @ 03:00:00

## 2020-11-09 MED ADMIN — immun glob G(IgG)-pro-IgA 0-50 (PRIVIGEN) 10 % intravenous solution 45 g: 45 g | INTRAVENOUS | @ 18:00:00 | Stop: 2020-11-09

## 2020-11-09 MED ADMIN — DAPTOmycin (CUBICIN) 1,150 mg in sodium chloride (NS) 0.9 % 50 mL IVPB: 1150 mg | INTRAVENOUS | @ 14:00:00 | Stop: 2020-11-18

## 2020-11-09 MED ADMIN — carvediloL (COREG) tablet 12.5 mg: 12.5 mg | ORAL | @ 01:00:00

## 2020-11-09 MED ADMIN — PONATinib (ICLUSIG) tablet 30 mg **PATIENT SUPPLIED**: 30 mg | ORAL | @ 23:00:00

## 2020-11-09 MED ADMIN — valACYclovir (VALTREX) tablet 500 mg: 500 mg | ORAL | @ 13:00:00

## 2020-11-09 NOTE — Unmapped (Signed)
MICU Discharge Summary     Admit Date: 11/01/2020  Discharge Date: 11/09/2020  Admitting Physician: Montine Circle, MD   Discharge Physician: Montine Circle, *   Primary Care Provider: [name], MD    Admission Diagnoses:   sepsis    Discharge Diagnoses:   Patient Active Problem List    Diagnosis Date Noted   ??? Acute kidney injury superimposed on CKD (CMS-HCC) 11/02/2020   ??? COPD (chronic obstructive pulmonary disease) (CMS-HCC) 11/02/2020   ??? Respiratory failure with hypoxia (CMS-HCC) 11/01/2020   ??? Pneumonia 11/01/2020   ??? Chronic pain 11/01/2020   ??? ALL (acute lymphoblastic leukemia) (CMS-HCC) 09/21/2020   ??? Hypogammaglobulinemia (CMS-HCC) 07/29/2020   ??? Fever 07/18/2020   ??? Immunocompromised (CMS-HCC) 07/18/2020   ??? Recurrent major depressive disorder, in partial remission (CMS-HCC) 07/13/2020   ??? PTSD (post-traumatic stress disorder) 07/13/2020   ??? Anxiety 07/13/2020   ??? Insomnia 07/13/2020   ??? Hypokalemia 05/31/2020   ??? Hypomagnesemia 05/31/2020   ??? COVID-19 05/31/2020   ??? Dyspnea 05/31/2020   ??? Red blood cell antibody positive 02/14/2020   ??? Hiccups 02/06/2020   ??? Hypernatremia 02/06/2020   ??? MRSA bacteremia 01/28/2020   ??? Septic shock due to Staphylococcus aureus (CMS-HCC) 01/28/2020   ??? Hyperkalemia 01/28/2020   ??? Hyperphosphatemia 01/25/2020   ??? 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) 01/23/2020   ??? Tobacco use disorder 01/21/2020   ??? AKI (acute kidney injury) (CMS-HCC) 01/21/2020   ??? Transaminitis 01/21/2020         Brief History of Present Illness:   40yo with past medical history of ALL was on a ponatinib-blinatumomab continuous infusion before transfer to the ICU this admission.??He has a history of septic shock, candida krausei fungemia, MRSA bacteremia w/ septic emboli p/w acute hypoxia and chest pain found to have sepsis from pneumonia. Intubated 8/1 after failing bipap. ????However on 8/2 he looked ok and so we trialed extubation and he has titrated down on oxygen requirements this week. From [place], nighttime BiPAP to now on 4L [place] with increased needs with ambulation.??  ??   IR removed his Port 8/3 and oncology recommends PICC placement.  He will go home with daptomycin daily through 8/17.      He also has DRESS and is on prednisone 20 mg daily driven by dermatology. He will have dermatology follow up in two weeks.   ??    Consult Orders:  IP CONSULT TO INFECTIOUS DISEASES  IP CONSULT TO ONCOLOGY  IP CONSULT TO NUTRITION SERVICES  IP CONSULT TO WOUND NURSE  IP CONSULT TO DERMATOLOGY  IP CONSULT TO INTERVENTIONAL RADIOLOGY  IP CONSULT TO PSYCHIATRY  IP CONSULT TO WOUND NURSE     Discharge Vitals:   BP 172/100  - Pulse 107  - Temp 36.5 ??C (97.7 ??F)  - Resp 16  - Ht 188 cm (6' 2.02)  - Wt (!) 113.4 kg (250 lb)  - SpO2 97%  - BMI 32.08 kg/m??     Discharge Physical Exam:   General: NAD, well nourished  HEENT:  anicteric, no scleral edema, no conjuctival erythema/drainage ear canal and nares without any discharge, Well hydrated moist mucous membrane of the oral cavity. oropharhynx without any erythema or exudate.  CV: RRR. S1 and S2 normal, no m/r/c/g. no bruit, orJVD. DP Pulses 2  equal. No BLE edema.  Lungs:   CTA bilat, no wheezes/crackles/rhonchi. Good air movement.  Skin:   w/d/i, jaundiced, no rashes, lesions,  petechiae or breakdown. no drng, erythema.    Abd:   Normoactive bowel sounds x 4 quads, abdomen soft, non-tender and not distended. no rebound tenderness or guarding.   Ext: No cyanosis, bruising, clubbing or edema.     Anticipated needs:   Activity: regular  Diet: regular  See AVS for patient instructions    Discharge Disposition: fair    Discharge Medications:    Current Discharge Medication List      CONTINUE these medications which have NOT CHANGED    Details   acetaminophen (TYLENOL) 325 MG tablet Take 650 mg by mouth every six (6) hours as needed for pain.       amLODIPine (NORVASC) 10 MG tablet Take 1 tablet (10 mg total) by mouth daily.  Qty: 30 tablet, Refills: 0    Associated Diagnoses: 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)      aspirin 81 MG chewable tablet Chew 1 tablet (81 mg total) daily.  Qty: 36 tablet, Refills: 11    Associated Diagnoses: 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)      carvediloL (COREG) 12.5 MG tablet TAKE 1 TABLET BY MOUTH TWO TIMES A DAY.  Qty: 180 tablet, Refills: 1    Associated Diagnoses: Acute leukemia not having achieved remission (CMS-HCC)      clonazePAM (KLONOPIN) 0.5 MG tablet Take 1 tablet (0.5 mg total) by mouth two (2) times a day as needed for anxiety or sleep.  Qty: 60 tablet, Refills: 1    Associated Diagnoses: Anxiety; 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)      gabapentin (NEURONTIN) 300 MG capsule Take 1 capsule (300 mg total) by mouth Three (3) times a day.  Qty: 270 capsule, Refills: 6      hydrOXYzine (ATARAX) 25 MG tablet Take 1 tablet (25 mg total) by mouth every six (6) hours as needed.  Qty: 60 tablet, Refills: 1    Associated Diagnoses: Rash      isavuconazonium sulfate (CRESEMBA) 186 mg cap capsule Take 2 capsules (372 mg total) by mouth daily.  Qty: 56 capsule, Refills: 2    Associated Diagnoses: Sepsis, due to unspecified organism, unspecified whether acute organ dysfunction present (CMS-HCC)      OXYCONTIN 15 mg 12 hr crush resistant ER/CR tablet TAKE 1 TABLET (15 MG TOTAL) BY MOUTH TWO (2) TIMES A DAY FOR 28 DAYS.      PONATinib (ICLUSIG) 30 mg tablet Take 1 tablet (30 mg total) by mouth daily. Swallow tablets whole. Do not crush, break, cut or chew tablets.  Qty: 30 tablet, Refills: 5    Associated Diagnoses: Acute lymphoblastic leukemia (ALL) in remission (CMS-HCC)      sulfamethoxazole-trimethoprim (BACTRIM DS) 800-160 mg per tablet Take 1 tablet (160 mg of trimethoprim total) by mouth 2 times a day on Saturday, Sunday. For prophylaxis while on chemo.  Qty: 48 tablet, Refills: 3    Associated Diagnoses: 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)      valACYclovir (VALTREX) 500 MG tablet Take 1 tablet (500 mg total) by mouth daily.  Qty: 90 tablet, Refills: 11    Associated Diagnoses: Acute leukemia not having achieved remission (CMS-HCC)      nortriptyline (PAMELOR) 75 MG capsule Take 1 capsule (75 mg total) by mouth nightly.  Qty: 30 capsule, Refills: 11    Associated Diagnoses: Major depressive disorder, recurrent, moderate (CMS-HCC); Anxiety; Insomnia, unspecified type  Allergies:  Allergies   Allergen Reactions   ??? Bupropion Hcl Other (See Comments)     Per patient out of touch with reality, suicidal, homicidal   ??? Cefepime Rash     DRESS   ??? Ceftaroline Fosamil Rash and Other (See Comments)     Rash X 2 02/2020, suspected DRESS 06/2020   ??? 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 Onion Anaphylaxis   ??? Vancomycin Analogues      Hearing loss with Lasix  Other reaction(s): Other (See Comments)  Hearing loss  lasix   ??? Bismuth Subsalicylate Nausea And Vomiting   ??? Furosemide      With Vancomycin caused hearing loss  Other reaction(s): Other (See Comments)  With Vancomycin caused hearing loss       Code Status:   Full Code      Future Appointments   Date Time Provider Department Center   11/11/2020 10:30 AM ADULT ONC LAB [facility]   11/11/2020 11:30 AM Sean Marrian Salvage, AGNP [id number]   11/13/2020 10:00 AM Park Breed, MD [id number]   11/18/2020  2:00 PM DERMAT SV RESIDENT 1 [id number] [facility]   11/24/2020  1:00 PM Melissa Ronalee Belts, PMHNP [id number] [facility]   12/03/2020 10:00 AM Everlena Cooper, MD [id number]   01/25/2021 11:00 AM PFT 1 [facility]ULSP[facility]ET [facility]   01/25/2021  1:30 PM Joyice Faster, MD UNCPULSPCLET [facility]        Discharge Management:   This plan was discussed with Dr. Colon Branch who is available and in agreement. I personally performed this service and I spent less than 30 minutes in the discharge of this patient.      Lanae Crumbly, NP    11/09/20 11:14 AM

## 2020-11-09 NOTE — Unmapped (Signed)
PICC LINE INSERTION PROCEDURE NOTE    Indications:  Chemotherapy    Consent/Time Out:    Risks, benefits and alternatives discussed with patient and guardian.  Written Consent was obtained prior to the procedure and is detailed in the medical record.  Prior to the start of the procedure, a time out was taken and the identity of the patient was confirmed via name, medical record number and  date of birth.  The availability of the correct equipment was verified.    Procedure Details:  The vein was identified and measured for appropriate catheter length. Maximum sterile techniques including PPE were utilized per protocol.  Sterile field prepared with necessary supplies and equipment. Insertion site was prepped with chlorhexidine solution and allowed to dry. Lidocaine 2 mL subcutaneously and intradermally administered to insertion site.  The catheter was primed with normal saline.  A 4 FR Double lumen was inserted to the R Basilic vein with 1 insertion attempt(s).  Catheter aspirated, 6 mL blood return present.  The catheter was then flushed with 20 mL of normal saline.   Insertion site cleansed, and sterile dressing applied per manufacturer guidelines. The Central Line Checklist was referenced.  The catheter was inserted without difficulty by Adrian Prows RN and assisted by Cyndie Mull RN.    Findings:  Manufacturer:  [employer]ard  Lot #:  [id number]  CT Injectable (power):  Yes  Arm Circumference:  34  cm  Total catheter length:  43 cm.    External length:  0 cm.  Catheter trimmed:  yes  Port reserved:  No    Vein Size:   Right arm true basilic vein compressible:  Yes, measurement:   (see image)    Tip Placement Verification:   3[employer]    Workup Time:    45 minutes    Recommendations:  PICC brochure given to patient with teaching instructions      See vein image below:

## 2020-11-09 NOTE — Unmapped (Signed)
Shift Summary: Patient weaned off nasal cannula. Ambulated hall with PT/OT. Awaiting floor bed.     Problem: Adult Inpatient Plan of Care  Goal: Plan of Care Review  Outcome: Progressing  Goal: Patient-Specific Goal (Individualized)  Outcome: Progressing  Goal: Absence of Hospital-Acquired Illness or Injury  Outcome: Progressing  Intervention: Identify and Manage Fall Risk  Recent Flowsheet Documentation  Taken 11/08/2020 0800 by Sherlyn Lick, RN  Safety Interventions:   family at bedside   isolation precautions  Intervention: Prevent and Manage VTE (Venous Thromboembolism) Risk  Recent Flowsheet Documentation  Taken 11/08/2020 1800 by Sherlyn Lick, RN  Activity Management: bedrest  Taken 11/08/2020 1600 by Sherlyn Lick, RN  Activity Management: bedrest  Taken 11/08/2020 1400 by Sherlyn Lick, RN  Activity Management: bedrest  Taken 11/08/2020 1200 by Sherlyn Lick, RN  Activity Management: up ad lib  Taken 11/08/2020 1000 by Sherlyn Lick, RN  Activity Management: bedrest  Taken 11/08/2020 0800 by Sherlyn Lick, RN  Activity Management: bedrest  Goal: Optimal Comfort and Wellbeing  Outcome: Progressing  Goal: Readiness for Transition of Care  Outcome: Progressing  Goal: Rounds/Family Conference  Outcome: Progressing     Problem: Infection  Goal: Absence of Infection Signs and Symptoms  Outcome: Progressing  Intervention: Prevent or Manage Infection  Recent Flowsheet Documentation  Taken 11/08/2020 0800 by Sherlyn Lick, RN  Isolation Precautions: contact precautions maintained     Problem: Fluid Imbalance (Pneumonia)  Goal: Fluid Balance  Outcome: Progressing     Problem: Infection (Pneumonia)  Goal: Resolution of Infection Signs and Symptoms  Outcome: Progressing  Intervention: Prevent Infection Progression  Recent Flowsheet Documentation  Taken 11/08/2020 0800 by Sherlyn Lick, RN  Isolation Precautions: contact precautions maintained     Problem: Respiratory Compromise (Pneumonia)  Goal: Effective Oxygenation and Ventilation  Outcome: Progressing  Intervention: Optimize Oxygenation and Ventilation  Recent Flowsheet Documentation  Taken 11/08/2020 1800 by Sherlyn Lick, RN  Head of Bed [facility]) Positioning: HOB at 30-45 degrees  Taken 11/08/2020 1600 by Sherlyn Lick, RN  Head of Bed [facility]) Positioning: HOB at 30-45 degrees  Taken 11/08/2020 1400 by Sherlyn Lick, RN  Head of Bed [facility]) Positioning: HOB at 30-45 degrees  Taken 11/08/2020 1200 by Sherlyn Lick, RN  Head of Bed [facility]) Positioning: HOB at 30-45 degrees  Taken 11/08/2020 1000 by Sherlyn Lick, RN  Head of Bed [facility]) Positioning: HOB at 30-45 degrees  Taken 11/08/2020 0800 by Sherlyn Lick, RN  Head of Bed [facility]) Positioning: HOB at 30-45 degrees     Problem: Fall Injury Risk  Goal: Absence of Fall and Fall-Related Injury  Outcome: Progressing  Intervention: Promote Injury-Free Environment  Recent Flowsheet Documentation  Taken 11/08/2020 0800 by Sherlyn Lick, RN  Safety Interventions:   family at bedside   isolation precautions     Problem: Skin Injury Risk Increased  Goal: Skin Health and Integrity  Outcome: Progressing  Intervention: Optimize Skin Protection  Recent Flowsheet Documentation  Taken 11/08/2020 1800 by Sherlyn Lick, RN  Head of Bed [facility]) Positioning: HOB at 30-45 degrees  Taken 11/08/2020 1600 by Sherlyn Lick, RN  Head of Bed [facility]) Positioning: HOB at 30-45 degrees  Taken 11/08/2020 1400 by Sherlyn Lick, RN  Head of Bed [facility]) Positioning: HOB at 30-45 degrees  Taken 11/08/2020 1200 by Sherlyn Lick, RN  Head of Bed [facility]) Positioning: HOB at 30-45 degrees  Taken 11/08/2020 1000 by  Sherlyn Lick, RN  Head of Bed [facility]) Positioning: HOB at 30-45 degrees  Taken 11/08/2020 0800 by Sherlyn Lick, RN  Head of Bed [facility]) Positioning: HOB at 30-45 degrees     Problem: Self-Care Deficit  Goal: Improved Ability to Complete Activities of Daily Living  Outcome: Progressing     Problem: Impaired Wound Healing  Goal: Optimal Wound Healing  Outcome: Progressing  Intervention: Promote Wound Healing  Recent Flowsheet Documentation  Taken 11/08/2020 1800 by Sherlyn Lick, RN  Activity Management: bedrest  Taken 11/08/2020 1600 by Sherlyn Lick, RN  Activity Management: bedrest  Taken 11/08/2020 1400 by Sherlyn Lick, RN  Activity Management: bedrest  Taken 11/08/2020 1200 by Sherlyn Lick, RN  Activity Management: up ad lib  Taken 11/08/2020 1000 by Sherlyn Lick, RN  Activity Management: bedrest  Taken 11/08/2020 0800 by Sherlyn Lick, RN  Activity Management: bedrest     Problem: Hypertension Comorbidity  Goal: Blood Pressure in Desired Range  Outcome: Progressing     Problem: Obstructive Sleep Apnea Risk or Actual Comorbidity Management  Goal: Unobstructed Breathing During Sleep  Outcome: Progressing

## 2020-11-09 NOTE — Unmapped (Signed)
Order placed for patient to be off telemetry for ambulation off unit. Patient was off unit 2100 until 2215. No adverse events noted.    VSS this shift. Chronic R chest wall pain managed with scheduled and PRN doses of oxycodone, no additional analgesia requested this shift.    Plan to discharge after PICC placement today.    See flowsheets for additional detail.      Problem: Adult Inpatient Plan of Care  Goal: Plan of Care Review  Outcome: Ongoing - Unchanged  Goal: Patient-Specific Goal (Individualized)  Outcome: Ongoing - Unchanged  Goal: Absence of Hospital-Acquired Illness or Injury  Outcome: Ongoing - Unchanged  Intervention: Identify and Manage Fall Risk  Recent Flowsheet Documentation  Taken 11/08/2020 2215 by Marguerite Olea, RN  Safety Interventions: nonskid shoes/slippers when out of bed  Taken 11/08/2020 2000 by Marguerite Olea, RN  Safety Interventions: nonskid shoes/slippers when out of bed  Intervention: Prevent Skin Injury  Recent Flowsheet Documentation  Taken 11/08/2020 2000 by Marguerite Olea, RN  Skin Protection: adhesive use limited  Intervention: Prevent and Manage VTE (Venous Thromboembolism) Risk  Recent Flowsheet Documentation  Taken 11/09/2020 0400 by Marguerite Olea, RN  Activity Management: activity adjusted per tolerance  Taken 11/09/2020 0200 by Marguerite Olea, RN  Activity Management: activity adjusted per tolerance  Taken 11/09/2020 0000 by Marguerite Olea, RN  Activity Management: activity adjusted per tolerance  Taken 11/08/2020 2215 by Marguerite Olea, RN  Activity Management: activity adjusted per tolerance  Taken 11/08/2020 2000 by Marguerite Olea, RN  Activity Management: activity adjusted per tolerance  VTE Prevention/Management: anticoagulant therapy  Range of Motion: Bilateral Upper and Lower Extremities  Intervention: Prevent Infection  Recent Flowsheet Documentation  Taken 11/08/2020 2000 by Marguerite Olea, RN  Infection Prevention:   cohorting utilized   single patient room provided  Goal: Optimal Comfort and Wellbeing  Outcome: Ongoing - Unchanged  Goal: Readiness for Transition of Care  Outcome: Ongoing - Unchanged  Goal: Rounds/Family Conference  Outcome: Ongoing - Unchanged     Problem: Infection  Goal: Absence of Infection Signs and Symptoms  Outcome: Progressing  Intervention: Prevent or Manage Infection  Recent Flowsheet Documentation  Taken 11/09/2020 0400 by Marguerite Olea, RN  Isolation Precautions: contact precautions maintained  Taken 11/09/2020 0200 by Marguerite Olea, RN  Isolation Precautions: contact precautions maintained  Taken 11/09/2020 0000 by Marguerite Olea, RN  Isolation Precautions: contact precautions maintained  Taken 11/08/2020 2215 by Marguerite Olea, RN  Isolation Precautions: contact precautions maintained  Taken 11/08/2020 2000 by Marguerite Olea, RN  Infection Management: aseptic technique maintained  Isolation Precautions: contact precautions maintained     Problem: Fluid Imbalance (Pneumonia)  Goal: Fluid Balance  Outcome: Ongoing - Unchanged     Problem: Infection (Pneumonia)  Goal: Resolution of Infection Signs and Symptoms  Outcome: Progressing  Intervention: Prevent Infection Progression  Recent Flowsheet Documentation  Taken 11/09/2020 0400 by Marguerite Olea, RN  Isolation Precautions: contact precautions maintained  Taken 11/09/2020 0200 by Marguerite Olea, RN  Isolation Precautions: contact precautions maintained  Taken 11/09/2020 0000 by Marguerite Olea, RN  Isolation Precautions: contact precautions maintained  Taken 11/08/2020 2215 by Marguerite Olea, RN  Isolation Precautions: contact precautions maintained  Taken 11/08/2020 2000 by Marguerite Olea, RN  Infection Management: aseptic technique maintained  Isolation Precautions: contact precautions maintained     Problem: Respiratory Compromise (Pneumonia)  Goal: Effective Oxygenation and Ventilation  Outcome: Progressing  Intervention: Optimize Oxygenation and Ventilation  Recent Flowsheet Documentation  Taken 11/09/2020 0435 by Marguerite Olea, RN  Head of Bed [facility]) Positioning: HOB at 20-30 degrees  Taken 11/09/2020 0400 by Marguerite Olea, RN  Head of Bed [facility]) Positioning: HOB at 20-30 degrees  Taken 11/09/2020 0200 by Marguerite Olea, RN  Head of Bed [facility]) Positioning: HOB at 30-45 degrees  Taken 11/09/2020 0000 by Marguerite Olea, RN  Head of Bed [facility]) Positioning: HOB at 30-45 degrees  Taken 11/08/2020 2301 by Marguerite Olea, RN  Head of Bed [facility]) Positioning: HOB at 30-45 degrees  Taken 11/08/2020 2215 by Marguerite Olea, RN  Head of Bed [facility]) Positioning: HOB at 30-45 degrees  Taken 11/08/2020 2000 by Marguerite Olea, RN  Head of Bed [facility]) Positioning: HOB at 30-45 degrees  Taken 11/08/2020 1955 by Marguerite Olea, RN  Head of Bed [facility]) Positioning: HOB at 30-45 degrees     Problem: Fall Injury Risk  Goal: Absence of Fall and Fall-Related Injury  Outcome: Ongoing - Unchanged  Intervention: Identify and Manage Contributors  Recent Flowsheet Documentation  Taken 11/08/2020 2000 by Marguerite Olea, RN  Self-Care Promotion: independence encouraged  Intervention: Promote Injury-Free Environment  Recent Flowsheet Documentation  Taken 11/08/2020 2215 by Marguerite Olea, RN  Safety Interventions: nonskid shoes/slippers when out of bed  Taken 11/08/2020 2000 by Marguerite Olea, RN  Safety Interventions: nonskid shoes/slippers when out of bed     Problem: Skin Injury Risk Increased  Goal: Skin Health and Integrity  Outcome: Ongoing - Unchanged  Intervention: Optimize Skin Protection  Recent Flowsheet Documentation  Taken 11/09/2020 0435 by Marguerite Olea, RN  Head of Bed [facility]) Positioning: HOB at 20-30 degrees  Taken 11/09/2020 0400 by Marguerite Olea, RN  Head of Bed [facility]) Positioning: HOB at 20-30 degrees  Taken 11/09/2020 0200 by Marguerite Olea, RN  Head of Bed [facility]) Positioning: HOB at 30-45 degrees  Taken 11/09/2020 0000 by Marguerite Olea, RN  Head of Bed [facility]) Positioning: HOB at 30-45 degrees  Taken 11/08/2020 2301 by Marguerite Olea, RN  Head of Bed [facility]) Positioning: HOB at 30-45 degrees  Taken 11/08/2020 2215 by Marguerite Olea, RN  Head of Bed [facility]) Positioning: HOB at 30-45 degrees  Taken 11/08/2020 2000 by Marguerite Olea, RN  Pressure Reduction Techniques: frequent weight shift encouraged  Head of Bed (HOB) Positioning: HOB at 30-45 degrees  Pressure Reduction Devices: pressure-redistributing mattress utilized  Skin Protection: adhesive use limited  Taken 11/08/2020 1955 by Marguerite Olea, RN  Head of Bed [facility]) Positioning: HOB at 30-45 degrees     Problem: Self-Care Deficit  Goal: Improved Ability to Complete Activities of Daily Living  Outcome: Ongoing - Unchanged  Intervention: Promote Activity and Functional Independence  Recent Flowsheet Documentation  Taken 11/08/2020 2000 by Marguerite Olea, RN  Self-Care Promotion: independence encouraged     Problem: Impaired Wound Healing  Goal: Optimal Wound Healing  Outcome: Ongoing - Unchanged  Intervention: Promote Wound Healing  Recent Flowsheet Documentation  Taken 11/09/2020 0400 by Marguerite Olea, RN  Activity Management: activity adjusted per tolerance  Taken 11/09/2020 0200 by Marguerite Olea, RN  Activity Management: activity adjusted per tolerance  Taken 11/09/2020 0000 by Marguerite Olea, RN  Activity Management: activity adjusted per tolerance  Taken 11/08/2020 2215 by Marguerite Olea, RN  Activity Management: activity adjusted per tolerance  Taken 11/08/2020 2000 by Marguerite Olea, RN  Activity Management: activity adjusted per tolerance     Problem: Hypertension Comorbidity  Goal: Blood Pressure in Desired Range  Outcome: Progressing     Problem: Obstructive Sleep Apnea Risk or Actual Comorbidity Management  Goal: Unobstructed Breathing During Sleep  Outcome: Ongoing - Unchanged

## 2020-11-09 NOTE — Unmapped (Signed)
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 Malignant hematology was consulted for B-ALL. Port removed on 11/04/20. His blinatumomab is complete for the cycle and he has been resumed on home ponatinib on 8/3 without issue. Will eventually need to reestablish central access for OPAT and future chemoimmunotherapy. Would appreciate PICC placement sometime prior to discharge (non-urgent).    Recommendations:   -Appreciate excellent care of MICU team  -Continue home Ponatinib 30mg  (home supply)  -PICC today  -Will give dose of IVIg today per primary onc team - Gammagard 45g (Privigen substitution OK per pharmacy)  -Appreciate ICID for recommendations of MRSA tx and concomitant PPx plan  -Follow up in place with Adam Keith scheduled 11/11/20    This patient has been staffed with Dr. Aviva Keith. These recommendations were discussed with the primary team.     Malignant Hematology will sign off.     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 Malignant hematology was consulted for B-ALL. Patient unable to provide history so history obtained from chart review.    He presented to ED on 7/30 with 2 days of SOB, right sided chest pain, productive cough and chills. Initially only required 2L but had worsening respiratory symptoms requiring Bipap and then emergent intubation in MICU on 7/31. Currently intubated and sedated.     He had similar admission in 07/2020 with SOB, increased sputum, fever, and CT chest finding concerning for fungal infection.  Additionally, bronchoscopy performed during that admission, positive for MRSA.  Followed by ID and treated with lenzolid and isavuconazole.  He had recent CT chest on 7/28 that was negative for new or persistent infection.    Patient presented with new onset SOB, productive cough, and chills as well as right sided chest pain. HE did not report any altered mental status or neurologic changes.  CXR in ED with RLL opacity concerning for pneumonia. His respiratory status declined and he was emergently intubated in MICU.  Febrile to 38.9.  WBC elevated to 38.7 with 90+% neutrophils. He has been found to have MRSA bacteremia and suspected MRSA PNA. He has been treated with Daptomycin (7/31 - p) and Linezolid (7/31 - p) with marked improvement and extubation on 8/2.     Interval HPI:  Remains afebrile and vitally stable. Not on supplemental O2 this morning. No new concerns today. Feeling well. Ready to leave after PICC placement today.    Review of Systems: Review of Systems - negative unless specified above    Oncologic History:  Oncology History Overview Note   Referring/Local Oncologist: None    Diagnosis:Ph+ ALL    Genetics:    Karyotype/FISH:Abnormal Karyotype: 46,XY,t(9;22)(q34;q11.2)[1]/45,XY,der(7;9)(q10;q10)t(9;22)(q34;q11.2),der(22)t(9;22)[11]/46,sdl,+der(22)t(9;22)[5]/46,XY[3]     Abnormal FISH: A BCR/ABL1 interphase FISH assay shows an abnormal signal pattern in 97% of the 100 cells scored. Of note, 2/97 abnormal cells have an additional BCR/ABL1 fusion signal from  the der(22) chromosome, consistent with the additional copy of the der(22) seen in clone 3 by G-banding.  The findings support a diagnosis of leukemia and have implications for targeted therapy and for monitoring residual disease.      Molecular Genetics:  BCR-ABL1 p210 transcripts were detected at a level of 46.479 IS% ratio in bone marrow.  BCR-ABL1 p190 transcripts were detected at a level of 4 in 100,000 cells in bone marrow.    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)     01/24/2020 - 04/02/2020 Chemotherapy    IP/OP LEUKEMIA GRAAPH-2005 + RITUXIMAB < 60 YO  rituximab hypercvad (odd and even course)     02/24/2020 Remission    CR with PCR MRD+; marrow showing 70% blasts with <1% blasts, negative flow MRD, BCR-ABL p210 PCR 0.197%; CNS negative for blasts     03/09/2020 Progression    CSF - rare blast ID'ed - IT chemo given    03/13/20 - IT chemo, CSF negative     04/16/2020 Biopsy    BM Bx with 40-50% cellularity, <1% blasts, MRD flow cytometry negative, BCR-ABL p210 transcripts 0.036%     05/28/2020 - 05/28/2020 Chemotherapy    OP AML - CNS THERAPY (INTRATHECAL CYTARABINE, INTRATHECAL METHOTREXATE, OR INTRATHECAL TRIPLE)  Select one of the following: cytarabine IT 100 mg with hydrocortisone 50 mg, cytarabine IT 40 mg with methotrexate 15 mg with hydrocortisone 50 mg, OR methotrexate IT 12 mg with hydrocortisone 50 mg     06/19/2020 -  Chemotherapy    IP/OP LEUKEMIA BLINATUMOMAB 7-DAY INFUSION (MINIMAL RESIDUAL DISEASE; WT >= 22 KG) (HOME INFUSION)      Cycles 1*-4: Blinatumomab 28 mcg/day Days 1-28 of 6-week cycle.  *Given in the inpatient setting on Days 1-3 on Cycle 1 and Days 1-2 on Cycle 2, while other treatment days are given in the outpatient setting.    Cycle 1 MRD Dosing     07/18/2020 Adverse Reaction    Hospitalization: fevers. Pneumonia     07/23/2020 Biopsy    Diagnosis  Bone marrow, right iliac, aspiration and biopsy  -   Normocellular bone marrow (50%) with trilineage hematopoiesis and 1% blasts by manual aspirate differential  -   Flow cytometry MRD analysis reveals no definitive immunophenotypic evidence of residual B lymphoblastic leukemia   - BCR-ABL p210 0.006%         08/10/2020 -  Chemotherapy    Cycle 2 blinatumomab-ponatinib  Ponatinib 30mg     1 IT per cycle     09/21/2020 Adverse Reaction    Covid-19, symptomatic but not requiring hospitalization.     ALL (acute lymphoblastic leukemia) (CMS-HCC)   06/11/2020 -  Chemotherapy    IP/OP LEUKEMIA BLINATUMOMAB 7-DAY INFUSION (MINIMAL RESIDUAL DISEASE; WT >= 22 KG) (HOME INFUSION)  Cycles 1*-4: Blinatumomab 28 mcg/day Days 1-28 of 6-week cycle.  *Given in the inpatient setting on Days 1-3 on Cycle 1 and Days 1-2 on Cycle 2, while other treatment days are given in the outpatient setting.     09/21/2020 Initial Diagnosis    ALL (acute lymphoblastic leukemia) (CMS-HCC)         Medical, Surgical, Social, and Family History reviewed and updated.    Social History     Social History Narrative   ??? Not  on file       Allergies: is allergic to bupropion hcl, cefepime, ceftaroline fosamil, dapsone, onion, vancomycin analogues, bismuth subsalicylate, and furosemide.    Medications:   Meds:  ??? acetaminophen  1,000 mg Oral Q8H   ??? amLODIPine  10 mg Oral Daily   ??? aspirin  81 mg Oral Daily   ??? carvediloL  12.5 mg Oral BID   ??? DAPTOmycin  1,150 mg Intravenous Q24H   ??? enoxaparin (LOVENOX) injection  40 mg Subcutaneous QPM   ??? gabapentin  300 mg Oral TID   ??? isavuconazonium sulfate  372 mg Oral Daily   ??? melatonin  3 mg Oral QPM   ??? nortriptyline  75 mg Oral Nightly   ??? oxyCODONE  20 mg Oral Q6H SCH   ??? polyethylene glycol  17 g Oral BID   ??? PONATinib  30 mg Oral Q24H   ??? predniSONE  20 mg Oral Daily   ??? senna  2 tablet Oral Nightly   ??? sodium chloride  10 mL Intravenous Q8H   ??? sodium chloride  10 mL Intravenous Q8H   ??? sulfamethoxazole-trimethoprim  1 tablet Oral Once per day on Mon Wed Fri   ??? valACYclovir  500 mg Oral Daily   ??? zinc sulfate 220 mg Oral Daily     Continuous Infusions:  ??? IP okay to treat       PRN Meds:.IP okay to treat, ipratropium-albuteroL, ketorolac, LORazepam, ondansetron **OR** ondansetron, oxyCODONE    Objective:   Vitals: Temp:  [35.1 ??C (95.1 ??F)-37.2 ??C (99 ??F)] 36.5 ??C (97.7 ??F)  Heart Rate:  [73-107] 107  SpO2 Pulse:  [64-106] 106  Resp:  [15-21] 16  BP: (136-175)/(69-100) 172/100  MAP (mmHg):  [106-123] 120  FiO2 (%):  [40 %] 40 %  SpO2:  [90 %-97 %] 97 %    Physical Exam:     GEN: well developed, well nourished, in no distress  PSYCH: full and appropriate range of affect with good insight and judgement  HEENT: NCAT, pupils equal, sclerae anicteric  LYMPH: deferred  PULM: normal WOB, no supplemental O2 in place  COR: rrr, no m/r/g  GI: deferred   EXT: No edema  SKIN: No rashes, right upper chest port removed well healed        Test Results  Recent Labs     11/07/20  0458 11/08/20  0422 11/09/20  0624   WBC 9.7 11.2 13.1*   NEUTROABS 7.8 9.1* 10.0*   HGB 10.9* 10.9* 11.9*   PLT [phone]       Imaging: Radiology studies were personally reviewed

## 2020-11-10 LAB — CBC W/ AUTO DIFF
BASOPHILS ABSOLUTE COUNT: 0.1 10*9/L (ref 0.0–0.1)
BASOPHILS RELATIVE PERCENT: 0.9 %
EOSINOPHILS ABSOLUTE COUNT: 0.2 10*9/L (ref 0.0–0.5)
EOSINOPHILS RELATIVE PERCENT: 1.9 %
HEMATOCRIT: 33.5 % — ABNORMAL LOW (ref 39.0–48.0)
HEMOGLOBIN: 11.3 g/dL — ABNORMAL LOW (ref 12.9–16.5)
LYMPHOCYTES ABSOLUTE COUNT: 1.5 10*9/L (ref 1.1–3.6)
LYMPHOCYTES RELATIVE PERCENT: 15.2 %
MEAN CORPUSCULAR HEMOGLOBIN CONC: 33.6 g/dL (ref 32.0–36.0)
MEAN CORPUSCULAR HEMOGLOBIN: 31.9 pg (ref 25.9–32.4)
MEAN CORPUSCULAR VOLUME: 95 fL (ref 77.6–95.7)
MEAN PLATELET VOLUME: 7.9 fL (ref 6.8–10.7)
MONOCYTES ABSOLUTE COUNT: 0.4 10*9/L (ref 0.3–0.8)
MONOCYTES RELATIVE PERCENT: 4.3 %
NEUTROPHILS ABSOLUTE COUNT: 7.7 10*9/L (ref 1.8–7.8)
NEUTROPHILS RELATIVE PERCENT: 77.7 %
PLATELET COUNT: 237 10*9/L (ref 150–450)
RED BLOOD CELL COUNT: 3.53 10*12/L — ABNORMAL LOW (ref 4.26–5.60)
RED CELL DISTRIBUTION WIDTH: 16.1 % — ABNORMAL HIGH (ref 12.2–15.2)
WBC ADJUSTED: 9.8 10*9/L (ref 3.6–11.2)

## 2020-11-10 LAB — COMPREHENSIVE METABOLIC PANEL
ALBUMIN: 2.8 g/dL — ABNORMAL LOW (ref 3.4–5.0)
ALKALINE PHOSPHATASE: 168 U/L — ABNORMAL HIGH (ref 46–116)
ALT (SGPT): 54 U/L — ABNORMAL HIGH (ref 10–49)
ANION GAP: 8 mmol/L (ref 5–14)
AST (SGOT): 35 U/L — ABNORMAL HIGH (ref <=34)
BILIRUBIN TOTAL: 0.2 mg/dL — ABNORMAL LOW (ref 0.3–1.2)
BLOOD UREA NITROGEN: 20 mg/dL (ref 9–23)
BUN / CREAT RATIO: 13
CALCIUM: 9.3 mg/dL (ref 8.7–10.4)
CHLORIDE: 103 mmol/L (ref 98–107)
CO2: 23 mmol/L (ref 20.0–31.0)
CREATININE: 1.58 mg/dL — ABNORMAL HIGH
EGFR CKD-EPI (2021) MALE: 56 mL/min/{1.73_m2} — ABNORMAL LOW (ref >=60)
GLUCOSE RANDOM: 84 mg/dL (ref 70–179)
POTASSIUM: 4 mmol/L (ref 3.4–4.8)
PROTEIN TOTAL: 6.7 g/dL (ref 5.7–8.2)
SODIUM: 134 mmol/L — ABNORMAL LOW (ref 135–145)

## 2020-11-10 LAB — HHV8 PCR
HHV-8 DNA, QN PCR: NOT DETECTED {copies}/mL
HHV8 DNA, QNT PCR: NOT DETECTED {Log_copies}/mL

## 2020-11-10 LAB — PHOSPHORUS: PHOSPHORUS: 4.3 mg/dL (ref 2.4–5.1)

## 2020-11-10 LAB — MAGNESIUM: MAGNESIUM: 1.8 mg/dL (ref 1.6–2.6)

## 2020-11-10 MED ORDER — DAPTOMYCIN (CUBICIN) IVPB IN 50 ML
INTRAVENOUS | 0 refills | 30.00000 days | Status: CP
Start: 2020-11-10 — End: 2020-11-09

## 2020-11-10 MED ADMIN — valACYclovir (VALTREX) tablet 500 mg: 500 mg | ORAL | @ 13:00:00 | Stop: 2020-11-10

## 2020-11-10 MED ADMIN — zinc sulfate (ZINCATE) capsule 220 mg: 220 mg | ORAL | @ 13:00:00 | Stop: 2020-11-10

## 2020-11-10 MED ADMIN — sodium chloride (NS) 0.9 % flush 10 mL: 10 mL | INTRAVENOUS | @ 03:00:00

## 2020-11-10 MED ADMIN — predniSONE (DELTASONE) tablet 20 mg: 20 mg | ORAL | @ 13:00:00 | Stop: 2020-11-10

## 2020-11-10 MED ADMIN — gabapentin (NEURONTIN) capsule 300 mg: 300 mg | ORAL | @ 01:00:00

## 2020-11-10 MED ADMIN — amLODIPine (NORVASC) tablet 10 mg: 10 mg | ORAL | @ 13:00:00 | Stop: 2020-11-10

## 2020-11-10 MED ADMIN — carvediloL (COREG) tablet 12.5 mg: 12.5 mg | ORAL | @ 01:00:00

## 2020-11-10 MED ADMIN — oxyCODONE (ROXICODONE) immediate release tablet 20 mg: 20 mg | ORAL | @ 01:00:00 | Stop: 2020-11-17

## 2020-11-10 MED ADMIN — oxyCODONE (ROXICODONE) immediate release tablet 20 mg: 20 mg | ORAL | @ 10:00:00 | Stop: 2020-11-10

## 2020-11-10 MED ADMIN — oxyCODONE (ROXICODONE) immediate release tablet 20 mg: 20 mg | ORAL | @ 03:00:00 | Stop: 2020-11-17

## 2020-11-10 MED ADMIN — acetaminophen (TYLENOL) tablet 1,000 mg: 1000 mg | ORAL | @ 03:00:00 | Stop: 2020-12-01

## 2020-11-10 MED ADMIN — aspirin chewable tablet 81 mg: 81 mg | ORAL | @ 13:00:00 | Stop: 2020-11-10

## 2020-11-10 MED ADMIN — gabapentin (NEURONTIN) capsule 300 mg: 300 mg | ORAL | @ 13:00:00 | Stop: 2020-11-10

## 2020-11-10 MED ADMIN — sodium chloride (NS) 0.9 % flush 10 mL: 10 mL | INTRAVENOUS | @ 11:00:00 | Stop: 2020-11-10

## 2020-11-10 MED ADMIN — acetaminophen (TYLENOL) tablet 1,000 mg: 1000 mg | ORAL | @ 13:00:00 | Stop: 2020-11-10

## 2020-11-10 MED ADMIN — nortriptyline (PAMELOR) capsule 75 mg: 75 mg | ORAL | @ 01:00:00

## 2020-11-10 MED ADMIN — carvediloL (COREG) tablet 12.5 mg: 12.5 mg | ORAL | @ 13:00:00 | Stop: 2020-11-10

## 2020-11-10 MED ADMIN — DAPTOmycin (CUBICIN) 1,150 mg in sodium chloride (NS) 0.9 % 50 mL IVPB: 1150 mg | INTRAVENOUS | @ 13:00:00 | Stop: 2020-11-10

## 2020-11-10 MED ADMIN — isavuconazonium sulfate (CRESEMBA) capsule 372 mg: 372 mg | ORAL | @ 13:00:00 | Stop: 2020-11-10

## 2020-11-10 MED FILL — ICLUSIG 30 MG TABLET: ORAL | 30 days supply | Qty: 30 | Fill #5

## 2020-11-10 NOTE — Unmapped (Signed)
Patient to discharge to home, follow up with home health for IV antibiotic administration. Appointments scheduled with provider for additional followup.    Patient alert and oriented. Patient continues to have pain, on scheduled pain medicine with positive effect. Vitals stable, on room air. Patient doesn't want to eat this morning, voiding. Patient did not have a BM in hospital today.    Patient given discharge instructions, all questions answered, wife to provide transport, at bedside. Patient declines transport to lobby.

## 2020-11-10 NOTE — Unmapped (Signed)
Problem: Adult Inpatient Plan of Care  Goal: Plan of Care Review  Outcome: Progressing  Goal: Patient-Specific Goal (Individualized)  Outcome: Progressing  Goal: Absence of Hospital-Acquired Illness or Injury  Outcome: Progressing  Intervention: Identify and Manage Fall Risk  Recent Flowsheet Documentation  Taken 11/09/2020 2200 by Cathey Endow, RN  Safety Interventions:   low bed   lighting adjusted for tasks/safety   isolation precautions   no IV/BP/blood draw right arm   nonskid shoes/slippers when out of bed  Intervention: Prevent Skin Injury  Recent Flowsheet Documentation  Taken 11/10/2020 0000 by Cathey Endow, RN  Skin Protection:   adhesive use limited   tubing/devices free from skin contact   transparent dressing maintained  Taken 11/09/2020 2100 by Cathey Endow, RN  Skin Protection:   adhesive use limited   tubing/devices free from skin contact   transparent dressing maintained  Intervention: Prevent and Manage VTE (Venous Thromboembolism) Risk  Recent Flowsheet Documentation  Taken 11/10/2020 0000 by Cathey Endow, RN  Activity Management: activity adjusted per tolerance  Taken 11/09/2020 2200 by Cathey Endow, RN  Activity Management: activity adjusted per tolerance  Taken 11/09/2020 2100 by Cathey Endow, RN  VTE Prevention/Management: anticoagulant therapy  Intervention: Prevent Infection  Recent Flowsheet Documentation  Taken 11/09/2020 2200 by Cathey Endow, RN  Infection Prevention:   cohorting utilized   equipment surfaces disinfected   environmental surveillance performed   hand hygiene promoted   personal protective equipment utilized  Goal: Optimal Comfort and Wellbeing  Outcome: Progressing  Goal: Readiness for Transition of Care  Outcome: Progressing  Goal: Rounds/Family Conference  Outcome: Progressing     Problem: Infection  Goal: Absence of Infection Signs and Symptoms  Outcome: Progressing  Intervention: Prevent or Manage Infection  Recent Flowsheet Documentation  Taken 11/09/2020 2200 by Cathey Endow, RN  Infection Management: aseptic technique maintained  Isolation Precautions: contact precautions maintained     Problem: Fluid Imbalance (Pneumonia)  Goal: Fluid Balance  Outcome: Progressing     Problem: Infection (Pneumonia)  Goal: Resolution of Infection Signs and Symptoms  Outcome: Progressing  Intervention: Prevent Infection Progression  Recent Flowsheet Documentation  Taken 11/09/2020 2200 by Cathey Endow, RN  Infection Management: aseptic technique maintained  Isolation Precautions: contact precautions maintained     Problem: Respiratory Compromise (Pneumonia)  Goal: Effective Oxygenation and Ventilation  Outcome: Progressing  Intervention: Optimize Oxygenation and Ventilation  Recent Flowsheet Documentation  Taken 11/10/2020 0000 by Cathey Endow, RN  Head of Bed [facility]) Positioning: HOB at 30-45 degrees  Taken 11/09/2020 2200 by Cathey Endow, RN  Head of Bed [facility]) Positioning: HOB at 30-45 degrees  Taken 11/09/2020 2100 by Cathey Endow, RN  Head of Bed [facility]) Positioning: HOB at 30-45 degrees     Problem: Fall Injury Risk  Goal: Absence of Fall and Fall-Related Injury  Outcome: Progressing  Intervention: Promote Injury-Free Environment  Recent Flowsheet Documentation  Taken 11/09/2020 2200 by Cathey Endow, RN  Safety Interventions:   low bed   lighting adjusted for tasks/safety   isolation precautions   no IV/BP/blood draw right arm   nonskid shoes/slippers when out of bed     Problem: Skin Injury Risk Increased  Goal: Skin Health and Integrity  Outcome: Progressing  Intervention: Optimize Skin Protection  Recent Flowsheet Documentation  Taken 11/10/2020 0000 by Cathey Endow, RN  Pressure Reduction Techniques: frequent weight shift encouraged  Head of Bed (HOB) Positioning: HOB at 30-45 degrees  Pressure Reduction Devices:   pressure-redistributing mattress utilized   positioning supports  utilized  Skin Protection:   adhesive use limited   tubing/devices free from skin contact   transparent dressing maintained  Taken 11/09/2020 2200 by Cathey Endow, RN  Head of Bed [facility]) Positioning: HOB at 30-45 degrees  Taken 11/09/2020 2100 by Cathey Endow, RN  Pressure Reduction Techniques:   frequent weight shift encouraged   pressure points protected  Head of Bed (HOB) Positioning: HOB at 30-45 degrees  Pressure Reduction Devices:   pressure-redistributing mattress utilized   positioning supports utilized  Skin Protection:   adhesive use limited   tubing/devices free from skin contact   transparent dressing maintained     Problem: Self-Care Deficit  Goal: Improved Ability to Complete Activities of Daily Living  Outcome: Progressing     Problem: Impaired Wound Healing  Goal: Optimal Wound Healing  Outcome: Progressing  Intervention: Promote Wound Healing  Recent Flowsheet Documentation  Taken 11/10/2020 0000 by Cathey Endow, RN  Activity Management: activity adjusted per tolerance  Taken 11/09/2020 2200 by Cathey Endow, RN  Activity Management: activity adjusted per tolerance     Problem: Hypertension Comorbidity  Goal: Blood Pressure in Desired Range  Outcome: Progressing     Problem: Obstructive Sleep Apnea Risk or Actual Comorbidity Management  Goal: Unobstructed Breathing During Sleep  Outcome: Progressing

## 2020-11-10 NOTE — Unmapped (Signed)
MICU Daily Progress Note     Date of Service: 11/10/2020    Problem List:   Principal Problem:    Pneumonia  Active Problems:    Tobacco use disorder    AKI (acute kidney injury) (CMS-HCC)    MRSA bacteremia    Recurrent major depressive disorder, in partial remission (CMS-HCC)    Immunocompromised (CMS-HCC)    Hypogammaglobulinemia (CMS-HCC)    ALL (acute lymphoblastic leukemia) (CMS-HCC)    Respiratory failure with hypoxia (CMS-HCC)    Chronic pain    Acute kidney injury superimposed on CKD (CMS-HCC)    COPD (chronic obstructive pulmonary disease) (CMS-HCC)  Resolved Problems:    * No resolved hospital problems. *      HPI: Adam [name] with PH+ B-ALL, COPD, hospitalization 07/2020 w/ MRSA bacteremia, Candida Krusei fungemia, active smoking, sp Covid infection 09/2020 followed by [facility] ID who presented to [facility] with worsening SOB x48 hours.    History provided by patient and wife. He was in his normal state of health until 10/29/20 when he started developing worsening SOB, productive cough and chills. Coughing spells were intermittently associated with he denies sick contacts, fever, diarrhea, dysuria, polyuria, aspiration or focalizing pain. Pt endorses taking all his medicines as prescribed including antifungal and ppx meds.     Of not pt hospitalized 08/2020 for induction therapy that was complicated by Septic Shock due to MRSA and Candida krusei fungemia, acute renal failure necessitating temporary iHD and AHRB requiring intubation and stay in MICU    Pt endorses being full code status and his decision maker is his wife as listed in epic.     Interval Events:  -s/p IVIG yesterday on room air  -home today    Neurological   Acute on Chronic Pain, Anxiety Followed by Palliative care  -- holding home oxycontin 15mg  bid, dilaudid 2mg  po q4 prn mod sev pain and klonipin 0.5mg  bid prn anxiety  - cont home gabapentin   -  scheduled oxy to 20 q 6 hr and add oral PRN's.    - appreciate psychiatry's assistance, added ativan 0.5 mg bid PRN  - given additional PO dilaudid overnight    Depression  -- Continue home nortriptyline    Pulmonary   AHRF 2/2 Pneumonia with History of Intubation, tobacco abuse   Arrived in ed needing bipap placement 70%. Duonebs given 2/2 history of copd and wheezing RLL in area of consolidation. Pt down to 65% with transition to Bipap 10/5.  - amidst scheduled nebs, airway clearance and narcotic for splinting, pt progressed to needing intubation d/t inc WOB and worsening hypoxia  - cont duoneb scheduled q6  - 8/2: extubated  - LBNC 4L    OSA  Pt with known sleep apnea on sleep study. Does not use cpap.  - has been using one here     Cardiovascular   Tachycardia  Hypotension s/p intubationLD: resolved   - normotensive on arrival, tachy improved w fluid and analgesia  - EKG no evid acs and trop flat  - addtl bolus given on arrival to MICU and peri intubation  - levo started for MAP goals >65: OFF   - formal echo: normal   - cont home regimen asa    Renal   AKI on CKD: resolved   Metabolic acidosis: resolved   Baseline Cr around 1.8. AKI to 2.4 most likely pre-renal in setting of NV from pneumonia and illness.  -- trend I/O w foley   --  stable Cr     Infectious Disease/Autoimmune   Pneumonia w hx MRSA bacteremia, cand fungemia  48 hours of chills, sob and productive cough with cxr cf rll pneumonia and leukocytosis. Most likely bacterial infection as pt on ppx  With cresemba, bactrim and valtrex. Initial lactate 2.2 down to 1.2 with IVF resusitation. Given vanc in ED and will stop vancomycin given hx of hearing loss.    - febrile, leukocytosis   - transferred to [facility] on linezolid, mero, mica >> changed mero to dapto and cefepime given past tolerance to cefepime  - cont abd regimen per ICID    For MRSA bacteremia + suspected MRSA PNA  ?? Daptomycin stopped 8/5  ?? Linezolid 600mg  q12h,  ?? Port REMOVED 8/3     PPX:  -- Continue Home crescemba  -- Continue home bactrim, ss MWF  -- continue home valtrex 500mg  daily    Cultures:  Blood Culture, Routine (no units)   Date Value   11/03/2020 No Growth at 5 days     Urine Culture, Comprehensive (no units)   Date Value   11/01/2020 NO GROWTH     Lower Respiratory Culture (no units)   Date Value   11/01/2020 3+ Methicillin resistant Staphylococcus aureus (A)     WBC (10*9/L)   Date Value   11/10/2020 9.8         History of Dress  Hx of dress on ceftaroline was on prednisone taper in may 2022, but may still have been on 15 mg/day.  Verifying with wife.    -- monitor for dress while undergoing tx with abx  - derm consult   - pred 20 mg restarted 8/1.    - stopped cefepime     FEN/GI   #NAI  - senna and miralax for bowel regimen  - reg diet        Malnutrition Assessment: Not done yet.  Body mass index is 32.08 kg/m??.  Patient does not meet AND/ASPEN criteria for malnutrition at this time (11/05/20 1324)            Heme/Coag   Ph+ B-ALL  Followed by [facility] Oncologist [name] on  C3D27 ponatinib-binatumomab running infusion upon transfer to MICU.   -- Consult Hem/Onc regarding chemotherapy/immunotherapy >> holding infusion for now, stopped 7/31  -- cont asa 81mg  daily while on ponatinib per onc recs  - oral chemo resumed 8/3.  Will need new line for iv chemo. Timing per onc/ICID     Hypogammaglobulinemia  Recieves IVIG q 6 weeks. Will check Ig levels as may benefit from IVIG in setting of infection.  --IgM, IgA, IgG    Endocrine   NAI  - accu checks q6    Integumentary   #NAI  - WOCN consulted for high risk skin assessment Yes.  - WOCN recs >> pending   - cont pressure mitigating precautions per skin policy    Prophylaxis/LDA/Restraints/Consults   Can CVC be removed? yes  Can A-line be removed? N/A, no A-line present  Can Foley be removed? N/A, no Foley present  Mobility plan: Step 4 - Out of bed assessment (sit at bedside)    Feeding: reg diet   Analgesia: Pain adequately controlled  Sedation SAT/SBT: yes  Thromboembolic ppx: SQ heparin  Head of bed >30 degrees: Yes  Ulcer ppx: Not indicated  Glucose within target range: Yes, in range    Does patient need/have an active type/screen? Yes    RASS at goal? No - adjusting sedation  or order to reflect need  [place] Agitation Assessment Scale (RASS) : Alert and calm (11/10/2020  8:00 AM)     Can antipsychotics be stopped? N/A, not on antipsychotics  CAM-ICU Result: CAM-ICU Negative (11/09/2020  9:00 PM)      Would hospice care be appropriate for this patient? No, patient improving or expected to improve    Patient Lines/Drains/Airways Status     Active Active Lines, Drains, & Airways     Name Placement date Placement time Site Days    PICC Double Lumen 11/09/20 Right Basilic 11/09/20  1204  Basilic  1              Patient Lines/Drains/Airways Status     Active Wounds     Name Placement date Placement time Site Days    Wound 11/06/20 Other (comment) Back Mid Blisters/excoriation/abrasion from scratching 11/06/20  0800  Back  4                Goals of Care     Code Status: Full Code    Designated Healthcare Decision Maker:  Mr. Rizzi current decisional capacity for healthcare decision-making is Full capacity. His designated Educational psychologist) is/are   HCDM (patient stated preference): Skippy, [place] - Spouse - [phone].      Subjective   Wants to go home           Allergies  Allergies   Allergen Reactions   ??? Bupropion Hcl Other (See Comments)     Per patient out of touch with reality, suicidal, homicidal   ??? Cefepime Rash     DRESS   ??? Ceftaroline Fosamil Rash and Other (See Comments)     Rash X 2 02/2020, suspected DRESS 06/2020   ??? 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 Onion Anaphylaxis   ??? Vancomycin Analogues      Hearing loss with Lasix  Other reaction(s): Other (See Comments)  Hearing loss  lasix   ??? Bismuth Subsalicylate Nausea And Vomiting   ??? Furosemide      With Vancomycin caused hearing loss  Other reaction(s): Other (See Comments)  With Vancomycin caused hearing loss       Meds  No current facility-administered medications on file prior to encounter.     Current Outpatient Medications on File Prior to Encounter   Medication Sig   ??? acetaminophen (TYLENOL) 325 MG tablet Take 650 mg by mouth every six (6) hours as needed for pain.    ??? amLODIPine (NORVASC) 10 MG tablet Take 1 tablet (10 mg total) by mouth daily.   ??? aspirin 81 MG chewable tablet Chew 1 tablet (81 mg total) daily.   ??? carvediloL (COREG) 12.5 MG tablet TAKE 1 TABLET BY MOUTH TWO TIMES A DAY.   ??? clonazePAM (KLONOPIN) 0.5 MG tablet Take 1 tablet (0.5 mg total) by mouth two (2) times a day as needed for anxiety or sleep.   ??? gabapentin (NEURONTIN) 300 MG capsule Take 1 capsule (300 mg total) by mouth Three (3) times a day.   ??? hydrOXYzine (ATARAX) 25 MG tablet Take 1 tablet (25 mg total) by mouth every six (6) hours as needed.   ??? isavuconazonium sulfate (CRESEMBA) 186 mg cap capsule Take 2 capsules (372 mg total) by mouth daily.   ??? OXYCONTIN 15 mg 12 hr crush resistant ER/CR tablet TAKE 1 TABLET (15 MG TOTAL) BY MOUTH TWO (2) TIMES A DAY FOR 28 DAYS.   ???  PONATinib (ICLUSIG) 30 mg tablet Take 1 tablet (30 mg total) by mouth daily. Swallow tablets whole. Do not crush, break, cut or chew tablets.   ??? sulfamethoxazole-trimethoprim (BACTRIM DS) 800-160 mg per tablet Take 1 tablet (160 mg of trimethoprim total) by mouth 2 times a day on Saturday, Sunday. For prophylaxis while on chemo.   ??? valACYclovir (VALTREX) 500 MG tablet Take 1 tablet (500 mg total) by mouth daily.   ??? nortriptyline (PAMELOR) 75 MG capsule Take 1 capsule (75 mg total) by mouth nightly.       Past Medical History  Past Medical History:   Diagnosis Date   ??? 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 BONE MARROW BIOPSY & ASPIRATION  01/21/2020        ??? IR INSERT PORT AGE GREATER THAN 5 YRS  03/10/2020    IR INSERT PORT AGE GREATER THAN 5 YRS 03/10/2020 [name], MD IMG VIR [facility]   ??? PR BRONCHOSCOPY,DIAGNOSTIC W LAVAGE Bilateral 07/20/2020    Procedure: BRONCHOSCOPY, RIGID OR FLEXIBLE, INCLUDE FLUOROSCOPIC GUIDANCE WHEN PERFORMED; W/BRONCHIAL ALVEOLAR LAVAGE WITH MODERATE SEDATION;  Surgeon: Dellis Filbert, MD;  Location: [facility]ON[facility] PROCEDURE LAB [facility];  Service: Pulmonary         Objective     Vitals - past 24 hours  Temp:  [36.4 ??C (97.5 ??F)-36.7 ??C (98.1 ??F)] 36.7 ??C (98.1 ??F)  Heart Rate:  [67-93] 75  SpO2 Pulse:  [73-118] 73  Resp:  [16] 16  BP: (105-173)/(72-121) 162/85  SpO2:  [88 %-98 %] 94 % Intake/Output  I/O last 3 completed shifts:  In: 520 [P.O.:440; IV Piggyback:80]  Out: 1825 [Urine:1825]     Physical Exam:    General:  Awake and alert   HEENT: MMM,   CV: Soft S1,S2, no murmurs, no BLE edema  Pulm: Diminished throughout and bases w R>L, apices few scattered rhonchi, no wheezing  GI:  NTND, BS+ no rebound or guarding x near Rt CPA and chest wall  MSK: normal muslce tone  Skin: no rashes/esions, clammy   Neuro: FC, mAE     Continuous Infusions:   ??? IP okay to treat     ??? sodium chloride         ??? IP okay to treat     ??? sodium chloride         Scheduled Medications:   ??? acetaminophen  1,000 mg Oral Q8H   ??? amLODIPine  10 mg Oral Daily   ??? aspirin  81 mg Oral Daily   ??? carvediloL  12.5 mg Oral BID   ??? DAPTOmycin  1,150 mg Intravenous Q24H   ??? enoxaparin (LOVENOX) injection  40 mg Subcutaneous QPM   ??? gabapentin  300 mg Oral TID   ??? isavuconazonium sulfate  372 mg Oral Daily   ??? melatonin  3 mg Oral QPM   ??? nortriptyline  75 mg Oral Nightly   ??? oxyCODONE  20 mg Oral Q6H SCH   ??? polyethylene glycol  17 g Oral BID   ??? PONATinib  30 mg Oral Q24H   ??? predniSONE  20 mg Oral Daily   ??? senna  2 tablet Oral Nightly   ??? sodium chloride  10 mL Intravenous Q8H   ??? sodium chloride  10 mL Intravenous Q8H   ??? sulfamethoxazole-trimethoprim  1 tablet Oral Once  per day on Mon Wed Fri   ??? valACYclovir  500 mg Oral Daily   ??? zinc sulfate  220 mg Oral Daily PRN medications:  acetaminophen **AND** diphenhydrAMINE, Implement **AND** Care order/instruction **AND** Vital signs **AND** Nursing oxygen orders / instructions **AND** sodium chloride **AND** sodium chloride 0.9% **AND** diphenhydrAMINE **AND** famotidine **AND** meperidine **AND** methylPREDNISolone sodium succinate (PF) **AND** EPINEPHrine, IP okay to treat, ipratropium-albuteroL, ketorolac, LORazepam, ondansetron **OR** ondansetron, oxyCODONE    Data/Imaging Review: Reviewed in Epic and personally interpreted on 11/10/2020. See EMR for detailed results.      Lanae Crumbly, NP

## 2020-11-10 NOTE — Unmapped (Signed)
Pt unfortunately unable to be discharged today due to inability to establish a home health nurse to care for PICC line and ABX administration. Pt also received a dose of IVIG and did not tolerate the drug, having a reaction requiring demerol x1 for rigors and benadryl 25mg  IV. Reaction subsided and IVIG as resumed at lower rate. Pt later refused and ordered the nurse to stop the infusion. Urine output adequate. Left unit for walk x 2 this shift. Pt safety maintained, .      Problem: Adult Inpatient Plan of Care  Goal: Plan of Care Review  Outcome: Progressing  Goal: Patient-Specific Goal (Individualized)  Outcome: Progressing  Goal: Absence of Hospital-Acquired Illness or Injury  Outcome: Progressing  Intervention: Prevent and Manage VTE (Venous Thromboembolism) Risk  Recent Flowsheet Documentation  Taken 11/09/2020 0800 by Nash Mantis, RN  Activity Management: activity adjusted per tolerance  Range of Motion: Bilateral Upper and Lower Extremities  Goal: Optimal Comfort and Wellbeing  Outcome: Progressing  Goal: Readiness for Transition of Care  Outcome: Progressing  Goal: Rounds/Family Conference  Outcome: Progressing     Problem: Infection  Goal: Absence of Infection Signs and Symptoms  Outcome: Progressing     Problem: Fluid Imbalance (Pneumonia)  Goal: Fluid Balance  Outcome: Progressing     Problem: Infection (Pneumonia)  Goal: Resolution of Infection Signs and Symptoms  Outcome: Progressing     Problem: Respiratory Compromise (Pneumonia)  Goal: Effective Oxygenation and Ventilation  Outcome: Progressing  Intervention: Optimize Oxygenation and Ventilation  Recent Flowsheet Documentation  Taken 11/09/2020 0800 by Nash Mantis, RN  Head of Bed [facility]) Positioning: HOB at 30-45 degrees     Problem: Fall Injury Risk  Goal: Absence of Fall and Fall-Related Injury  Outcome: Progressing     Problem: Skin Injury Risk Increased  Goal: Skin Health and Integrity  Outcome: Progressing  Intervention: Optimize Skin Protection  Recent Flowsheet Documentation  Taken 11/09/2020 0800 by Nash Mantis, RN  Pressure Reduction Techniques: frequent weight shift encouraged  Head of Bed (HOB) Positioning: HOB at 30-45 degrees  Pressure Reduction Devices: pressure-redistributing mattress utilized     Problem: Self-Care Deficit  Goal: Improved Ability to Complete Activities of Daily Living  Outcome: Progressing     Problem: Impaired Wound Healing  Goal: Optimal Wound Healing  Outcome: Progressing  Intervention: Promote Wound Healing  Recent Flowsheet Documentation  Taken 11/09/2020 0800 by Nash Mantis, RN  Activity Management: activity adjusted per tolerance     Problem: Hypertension Comorbidity  Goal: Blood Pressure in Desired Range  Outcome: Progressing     Problem: Obstructive Sleep Apnea Risk or Actual Comorbidity Management  Goal: Unobstructed Breathing During Sleep  Outcome: Progressing

## 2020-11-10 NOTE — Unmapped (Addendum)
As requested, I have contacted the patient to review/clarify the following:    ?? [name] has been discharged from the hospital today after being treated for sepsis and acute hypoxia.    ?? Home health services have been coordinated at time of discharge with regard to PICC line management/line care and will continue to receive daptomycin daily through 11/18/2020.  ?? Follow-up with ICID remains tentatively set for 11/13/2020.    ?? Follow-up with the Leukemia Care Team is tentatively set for Wednesday, November 11, 2020.  ?? The patient has expressed a desire for this appointment to be a phone or telehealth follow-up visit.  ?? Moreover, [name] has also indicated that he does not have available transportation to attend tomorrow's in person clinic visit.  ?? ADDENDA 11/10/2020 at 4:05 PM:  ?? [name] has contacted the clinic back and provided notification that he will attend tomorrow's return clinic visit as scheduled and not pursue a telehealth alternative.    ?? Should [name] be amenable, [name] is inclined to pursue scheduling an appointment for new port placement.    Otherwise, the patient expressed verbal understanding and had no other questions or concerns at this time.  He is in agreement with the plan outlined above and will contact clinic back sooner should he have any other issues.    No other actions taken and this updates been shared with the Leukemia Care Team.

## 2020-11-11 ENCOUNTER — Other Ambulatory Visit: Admit: 2020-11-11 | Discharge: 2020-11-11 | Payer: MEDICAID

## 2020-11-11 ENCOUNTER — Ambulatory Visit: Admit: 2020-11-11 | Discharge: 2020-11-11 | Payer: MEDICAID | Attending: Adult Health | Primary: Adult Health

## 2020-11-11 DIAGNOSIS — C9100 Acute lymphoblastic leukemia not having achieved remission: Principal | ICD-10-CM

## 2020-11-11 LAB — CBC W/ AUTO DIFF
BASOPHILS ABSOLUTE COUNT: 0.1 10*9/L (ref 0.0–0.1)
BASOPHILS RELATIVE PERCENT: 0.8 %
EOSINOPHILS ABSOLUTE COUNT: 0.2 10*9/L (ref 0.0–0.5)
EOSINOPHILS RELATIVE PERCENT: 1.3 %
HEMATOCRIT: 34.2 % — ABNORMAL LOW (ref 39.0–48.0)
HEMOGLOBIN: 11.5 g/dL — ABNORMAL LOW (ref 12.9–16.5)
LYMPHOCYTES ABSOLUTE COUNT: 1.2 10*9/L (ref 1.1–3.6)
LYMPHOCYTES RELATIVE PERCENT: 8.6 %
MEAN CORPUSCULAR HEMOGLOBIN CONC: 33.6 g/dL (ref 32.0–36.0)
MEAN CORPUSCULAR HEMOGLOBIN: 31.6 pg (ref 25.9–32.4)
MEAN CORPUSCULAR VOLUME: 94.1 fL (ref 77.6–95.7)
MEAN PLATELET VOLUME: 8 fL (ref 6.8–10.7)
MONOCYTES ABSOLUTE COUNT: 0.6 10*9/L (ref 0.3–0.8)
MONOCYTES RELATIVE PERCENT: 4 %
NEUTROPHILS ABSOLUTE COUNT: 11.9 10*9/L — ABNORMAL HIGH (ref 1.8–7.8)
NEUTROPHILS RELATIVE PERCENT: 85.3 %
PLATELET COUNT: 289 10*9/L (ref 150–450)
RED BLOOD CELL COUNT: 3.63 10*12/L — ABNORMAL LOW (ref 4.26–5.60)
RED CELL DISTRIBUTION WIDTH: 15.8 % — ABNORMAL HIGH (ref 12.2–15.2)
WBC ADJUSTED: 13.9 10*9/L — ABNORMAL HIGH (ref 3.6–11.2)

## 2020-11-11 LAB — COMPREHENSIVE METABOLIC PANEL
ALBUMIN: 3.1 g/dL — ABNORMAL LOW (ref 3.4–5.0)
ALKALINE PHOSPHATASE: 186 U/L — ABNORMAL HIGH (ref 46–116)
ALT (SGPT): 51 U/L — ABNORMAL HIGH (ref 10–49)
ANION GAP: 9 mmol/L (ref 5–14)
AST (SGOT): 28 U/L (ref <=34)
BILIRUBIN TOTAL: 0.2 mg/dL — ABNORMAL LOW (ref 0.3–1.2)
BLOOD UREA NITROGEN: 19 mg/dL (ref 9–23)
BUN / CREAT RATIO: 12
CALCIUM: 9.6 mg/dL (ref 8.7–10.4)
CHLORIDE: 102 mmol/L (ref 98–107)
CO2: 24 mmol/L (ref 20.0–31.0)
CREATININE: 1.63 mg/dL — ABNORMAL HIGH
EGFR CKD-EPI (2021) MALE: 54 mL/min/{1.73_m2} — ABNORMAL LOW (ref >=60)
GLUCOSE RANDOM: 104 mg/dL (ref 70–179)
POTASSIUM: 4 mmol/L (ref 3.4–4.8)
PROTEIN TOTAL: 7.3 g/dL (ref 5.7–8.2)
SODIUM: 135 mmol/L (ref 135–145)

## 2020-11-11 NOTE — Unmapped (Signed)
1232 Additional labs drawn and sent for analysis.

## 2020-11-11 NOTE — Unmapped (Signed)
As requested by the patient the following has been completed:    ?? Corresponding documentation pertaining to [facility] [place] has been received, completed, signed by [name] and returned to the patient in the clinic setting today.  ?? The original version of the return documentation is archived in the media section of the electronic medical record.  ??

## 2020-11-11 NOTE — Unmapped (Signed)
[facility]    Patient Name: Adam Keith  Patient Age: 40 y.o.  Encounter Date: 11/11/2020    Primary Care Provider:  [name], MD    Referring Physician:  Guerry Bruin, MD  [address]  [id number] [facility]  [place],  [place] [address]    Reason for visit:  Ph+ B-ALL follow up to consider timing of therapy change    Assessment:  A 40 y.o. 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 The course was complicated by septic shock, candida krusei fungemia, MRSA bacteremia with septic emboli c/b acute renal failure and respiratory distress requiring dialysis and intubation.  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  He subsequently had difficulty tolerating single agent dasatinib (as a bridge to planned ponatinib-blinatumomab--had fluid retention and pleural effusion).        He is now s/p cycle 3 of ponatinib-blinatumomab. This course was complicated by MRSA pneumonia requiring MICU level of care including intubation. Blinatumomab was stopped (Day 26) and ponatinib was held. Fortunately he recovered quickly and his home. Ponatinib was restarted prior to discharge. Daptomycin will continue through 8/17. We discussed his repeated challenges with MRSA infections and history of rapid/severe infections. We will discuss this further with ICID to evaluate what steps to take next to manage the MRSA colonization.    Plan to start Cycle 4 in September, to allow further time to recover and for him to take a much-anticipated family vacation.  UPDATE: BCR-ABL is negative in PB. Will D-R ponatinib at start of Cycle 4.    He had a significant reaction to IVIG and does not want to receive further IVIG.    Rash has not recurred, but he does continue to itch. He continues the steroids for DRESS and is followed by Dermatology    Sept 9-11 - Beach trip. 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 Caryn Section, dermatolgy  - rash currently resolved but itching persists  - continue prednisone     Dyspnea, PND, edema, pleural effusion:  Likely due to dasatinib.  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 Now s/p dialysis  - stable Cr appears to have developed CKD due to incompletely healed acute injury.  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 [name] 8/12  **Hx Candidemia during induction  -isavuconazole   - followed by Dr. [name]  **Hx Covid-19   - symptoms now resolved  Symptom management: bowel regimen as listed in medication list, analgesics as listed in medication list or symptoms reviewed and due to acceptable control, no changes made  **Peripheral neuropathy, sensory and motor  - Grade 1-2 monitor  **Chronic Back Pain, complicated by hospital stay and procedures  - Followed by Dr. Genice Rouge, Palliative Care  **Depression/anxiety: met with [name], [facility]  - nortriptyline  - PRN clonazepam  - has not been using PRN hydroxyzine      Venous access: PICC line in place     Supportive Care Recommendations:  We recommend based on the patient???s underlying diagnosis and treatment history the following supportive care:      1. Antimicrobial prophylaxis:    - Viral - valacyclovir 500mg  daily  - Bacterial - on treatment linezolid currently  - Fungal - on treatment isavuconazole  - PJP - Bactrim DS BID Sat/Sun  -Evusheld 04/21/2020-recommend another dose to achieve FDA recommendation update.    2. Blood product support:  I recommend the following intervals for laboratory monitoring to determine transfusion needs and monitor for hematologic effects of therapy and underlying disease: no routine monitoring outside of clinic follow-up    Leukoreduced blood products are required.  Irradiated blood products are preferred, but in case of urgent transfusion needs non-irradiated blood products may be used:     -  RBC transfusion threshold: transfuse 2 units for Hgb < 8 g/dL.  -  Platelet transfusion threshold: transfuse 1 unit of platelets for platelet count < 10, or for bleeding or need for invasive procedure.    3. Hematopoietic growth factor support: none      Care coordination: The next [facility] Follow up requested:  - see notes above  - IT is scheduled    These services include the following elements of medical decision making:  addressing a hematologic cancer that poses a threat to life and/or bone marrow function  interpretation of diagnostic test reports or ordering of diagnostic tests and independent interpretation of diagnostic tests  High risk of morbidity due to drug therapy requiring monitoring for toxicity or decisions regarding goals and continuation of antineoplastic therapy    [name], AGPCNP-BC  Nurse Practitioner  Hematology/Oncology  [facility]      Troy Sine. [name], MD  Leukemia Program  Division of Hematology/Oncology  [facility]    Nurse Navigator (non-clinical trial patients): Colletta Maryland, RN        Tel. [phone]       Fax. [phone]  Toll-free appointments: [phone]  Scheduling assistance: [phone]  After hours/weekends: [phone] (ask for adult hematology/oncology on-call)        History of Present Illness:  We had the pleasure of seeing Adam Keith in the Leukemia Clinic at the [place] [facility] [place] on 11/11/2020.  He is a 40 y.o. male with Ph+ B-ALL.          His oncologic history is as follows:      Oncology History Overview Note   Referring/Local Oncologist: None    Diagnosis:Ph+ ALL    Genetics:    Karyotype/FISH:Abnormal Karyotype: 46,XY,t(9;22)(q34;q11.2)[1]/45,XY,der(7;9)(q10;q10)t(9;22)(q34;q11.2),der(22)t(9;22)[11]/46,sdl,+der(22)t(9;22)[5]/46,XY[3]     Abnormal FISH: A BCR/ABL1 interphase FISH assay shows an abnormal signal pattern in 97% of the 100 cells scored. 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  The findings support a diagnosis of leukemia and have implications for targeted therapy  and for monitoring residual disease.      Molecular Genetics:  BCR-ABL1 p210 transcripts were detected at a level of 46.479 IS% ratio in bone marrow.  BCR-ABL1 p190 transcripts were detected at a level of 4 in 100,000 cells in bone marrow.    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)     01/24/2020 - 04/02/2020 Chemotherapy    IP/OP LEUKEMIA GRAAPH-2005 + RITUXIMAB < 60 YO  rituximab hypercvad (odd and even course)     02/24/2020 Remission    CR with PCR MRD+; marrow showing 70% blasts with <1% blasts, negative flow MRD, BCR-ABL p210 PCR 0.197%; CNS negative for blasts     03/09/2020 Progression    CSF - rare blast ID'ed - IT chemo given    03/13/20 - IT chemo, CSF negative     04/16/2020 Biopsy    BM Bx with 40-50% cellularity, <1% blasts, MRD flow cytometry negative, BCR-ABL p210 transcripts 0.036%     05/28/2020 - 05/28/2020 Chemotherapy    OP AML - CNS THERAPY (INTRATHECAL CYTARABINE, INTRATHECAL METHOTREXATE, OR INTRATHECAL TRIPLE)  Select one of the following: cytarabine IT 100 mg with hydrocortisone 50 mg, cytarabine IT 40 mg with methotrexate 15 mg with hydrocortisone 50 mg, OR methotrexate IT 12 mg with hydrocortisone 50 mg     06/19/2020 -  Chemotherapy    IP/OP LEUKEMIA BLINATUMOMAB 7-DAY INFUSION (MINIMAL RESIDUAL DISEASE; WT >= 22 KG) (HOME INFUSION)      Cycles 1*-4: Blinatumomab 28 mcg/day Days 1-28 of 6-week cycle.  *Given in the inpatient setting on Days 1-3 on Cycle 1 and Days 1-2 on Cycle 2, while other treatment days are given in the outpatient setting.    Cycle 1 MRD Dosing     07/18/2020 Adverse Reaction    Hospitalization: fevers. Pneumonia     07/23/2020 Biopsy    Diagnosis  Bone marrow, right iliac, aspiration and biopsy  -   Normocellular bone marrow (50%) with trilineage hematopoiesis and 1% blasts by manual aspirate differential  -   Flow cytometry MRD analysis reveals no definitive immunophenotypic evidence of residual B lymphoblastic leukemia   - BCR-ABL p210 0.006%         08/10/2020 -  Chemotherapy    Cycle 2 blinatumomab-ponatinib  Ponatinib 30mg     1 IT per cycle     09/21/2020 Adverse Reaction    Covid-19, symptomatic but not requiring hospitalization.     ALL (acute lymphoblastic leukemia) (CMS-HCC)   06/11/2020 -  Chemotherapy    IP/OP LEUKEMIA BLINATUMOMAB 7-DAY INFUSION (MINIMAL RESIDUAL DISEASE; WT >= 22 KG) (HOME INFUSION)  Cycles 1*-4: Blinatumomab 28 mcg/day Days 1-28 of 6-week cycle.  *Given in the inpatient setting on Days 1-3 on Cycle 1 and Days 1-2 on Cycle 2, while other treatment days are given in the outpatient setting.     09/21/2020 Initial Diagnosis    ALL (acute lymphoblastic leukemia) (CMS-HCC) Interim History:  Since his last visit he was hospitalized, requiring intubation, for MRSA pneumonia. See hospital course for details.    Today he reports he feels weak, but okay. He has some chest wall discomfort that lingers and back pain was worsened by the hospitalization. His breathing is stable since discharge.    His biggest concern is that he became so  sick, so quickly. He is concerned about the risk of this happening again.    Past Medical, Surgical and Family History were reviewed and pertinent updates were made in the Electronic Medical Record      ECOG Performance Status: 1    Medications:    Current Outpatient Medications   Medication Sig Dispense Refill   ??? acetaminophen (TYLENOL) 325 MG tablet Take 650 mg by mouth every six (6) hours as needed for pain.      ??? amLODIPine (NORVASC) 10 MG tablet Take 1 tablet (10 mg total) by mouth daily. 30 tablet 0   ??? aspirin 81 MG chewable tablet Chew 1 tablet (81 mg total) daily. 36 tablet 11   ??? carvediloL (COREG) 12.5 MG tablet TAKE 1 TABLET BY MOUTH TWO TIMES A DAY. 180 tablet 1   ??? clonazePAM (KLONOPIN) 0.5 MG tablet Take 1 tablet (0.5 mg total) by mouth two (2) times a day as needed for anxiety or sleep. 60 tablet 1   ??? gabapentin (NEURONTIN) 300 MG capsule Take 1 capsule (300 mg total) by mouth Three (3) times a day. 270 capsule 6   ??? isavuconazonium sulfate (CRESEMBA) 186 mg cap capsule Take 2 capsules (372 mg total) by mouth daily. 56 capsule 2   ??? nortriptyline (PAMELOR) 75 MG capsule Take 1 capsule (75 mg total) by mouth nightly. 30 capsule 11   ??? OXYCONTIN 15 mg 12 hr crush resistant ER/CR tablet TAKE 1 TABLET (15 MG TOTAL) BY MOUTH TWO (2) TIMES A DAY FOR 28 DAYS.     ??? PONATinib (ICLUSIG) 30 mg tablet Take 1 tablet (30 mg total) by mouth daily. Swallow tablets whole. Do not crush, break, cut or chew tablets. 30 tablet 5   ??? predniSONE (DELTASONE) 20 MG tablet Take 1 tablet (20 mg total) by mouth daily for 21 days. 21 tablet 0   ??? sodium chloride 0.9 % SolP 50 mL with DAPTOmycin 500 mg SolR 1,150 mg, OVERFILL 50 Inj 7 mL Infuse 1,150 mg into a venous catheter daily. 11 each 0   ??? sulfamethoxazole-trimethoprim (BACTRIM DS) 800-160 mg per tablet Take 1 tablet (160 mg of trimethoprim total) by mouth 2 times a day on Saturday, Sunday. For prophylaxis while on chemo. 48 tablet 3   ??? valACYclovir (VALTREX) 500 MG tablet Take 1 tablet (500 mg total) by mouth daily. 90 tablet 11   ??? hydrOXYzine (ATARAX) 25 MG tablet Take 1 tablet (25 mg total) by mouth every six (6) hours as needed. 60 tablet 1   ??? linezolid (ZYVOX) 600 mg tablet Take 1 tablet (600 mg total) by mouth Two (2) times a day. 60 tablet 0     No current facility-administered medications for this visit.       Vital Signs:  Vitals:    11/11/20 1142   BP: 143/87   Pulse: 88   Resp: 17   Temp: 36.2 ??C (97.1 ??F)   SpO2: 97%         Relevant Physical Exam Findings:  General: Resting in no apparent distress, accompanied by spouse  HEENT:  Clear sclera, conjunctiva, mask in place  LYMPH:  no palpable cervical, supraclavicular, axillary, or inguinal nodes  CARDAC: RRR, no R,M,Gs, no pitting peripheral edema  RESP: nonlabored, bilaterally CTA  GI: Soft, nontender, active bowel sounds, no hepatic or splenomegaly  NEURO: alert, 0x4, steady gait, no focal deficits  PSYCH: anxious  DERM: no visible rashes, lesions  LINE: PICC  Relevant Laboratory, radiology and pathology results:  I personally viewed the most recent internal records, laboratory results, hematopathology report and molecular pathology reports  and discussed the available results with the patient and family  A summary of results follows:  Office Visit on 11/11/2020   Component Date Value Ref Range Status   ??? Collection 11/11/2020 Collected   Final   ??? Case Report 11/11/2020    Final                    Value:Molecular Genetics Report                         Case: [id number]                                 Authorizing Provider:  Lenon Ahmadi, Collected:           11/11/2020 1232                                     AGNP                                                                         Ordering Location:     [facility]    Received:            11/11/2020 1236                                     [facility]                                                          Pathologist:           Prudencio Burly, MD                                                      Specimen:    Blood                                                                                     ??? Specimen Type 11/11/2020    Final                    Value:Blood   ??? BCR/ABL1 p210 Assay 11/11/2020 Negative   Final   ???  BCR/ABL1 p210 Assay Results 11/11/2020    Final                    Value:This result contains rich text formatting which cannot be displayed here.   Lab on 11/11/2020   Component Date Value Ref Range Status   ??? Sodium 11/11/2020 135  135 - 145 mmol/L Final   ??? Potassium 11/11/2020 4.0  3.4 - 4.8 mmol/L Final   ??? Chloride 11/11/2020 102  98 - 107 mmol/L Final   ??? CO2 11/11/2020 24.0  20.0 - 31.0 mmol/L Final   ??? Anion Gap 11/11/2020 9  5 - 14 mmol/L Final   ??? BUN 11/11/2020 19  9 - 23 mg/dL Final   ??? Creatinine 11/11/2020 1.63 (A) 0.60 - 1.10 mg/dL Final   ??? BUN/Creatinine Ratio 11/11/2020 12   Final   ??? eGFR CKD-EPI (2021) Male 11/11/2020 54 (A) >=60 mL/min/1.19m2 Final    eGFR calculated with CKD-EPI 2021 equation in accordance with [employer] and [employer] of Nephrology Task Force recommendations.   ??? Glucose 11/11/2020 104  70 - 179 mg/dL Final   ??? Calcium 16/01/9603 9.6  8.7 - 10.4 mg/dL Final   ??? Albumin 54/12/8117 3.1 (A) 3.4 - 5.0 g/dL Final   ??? Total Protein 11/11/2020 7.3  5.7 - 8.2 g/dL Final   ??? Total Bilirubin 11/11/2020 0.2 (A) 0.3 - 1.2 mg/dL Final   ??? AST 14/78/2956 28  <=34 U/L Final   ??? ALT 11/11/2020 51 (A) 10 - 49 U/L Final   ??? Alkaline Phosphatase 11/11/2020 186 (A) 46 - 116 U/L Final   ??? WBC 11/11/2020 13.9 (A) 3.6 - 11.2 10*9/L Final   ??? RBC 11/11/2020 3.63 (A) 4.26 - 5.60 10*12/L Final   ??? HGB 11/11/2020 11.5 (A) 12.9 - 16.5 g/dL Final   ??? HCT 21/30/8657 34.2 (A) 39.0 - 48.0 % Final   ??? MCV 11/11/2020 94.1  77.6 - 95.7 fL Final   ??? MCH 11/11/2020 31.6  25.9 - 32.4 pg Final   ??? MCHC 11/11/2020 33.6  32.0 - 36.0 g/dL Final   ??? RDW 84/69/6295 15.8 (A) 12.2 - 15.2 % Final   ??? MPV 11/11/2020 8.0  6.8 - 10.7 fL Final   ??? Platelet 11/11/2020 289  150 - 450 10*9/L Final   ??? Neutrophils % 11/11/2020 85.3  % Final   ??? Lymphocytes % 11/11/2020 8.6  % Final   ??? Monocytes % 11/11/2020 4.0  % Final   ??? Eosinophils % 11/11/2020 1.3  % Final   ??? Basophils % 11/11/2020 0.8  % Final   ??? Absolute Neutrophils 11/11/2020 11.9 (A) 1.8 - 7.8 10*9/L Final   ??? Absolute Lymphocytes 11/11/2020 1.2  1.1 - 3.6 10*9/L Final   ??? Absolute Monocytes 11/11/2020 0.6  0.3 - 0.8 10*9/L Final   ??? Absolute Eosinophils 11/11/2020 0.2  0.0 - 0.5 10*9/L Final   ??? Absolute Basophils 11/11/2020 0.1  0.0 - 0.1 10*9/L Final   No results displayed because visit has over 200 results.        Chest X-ray PA and lateral ??Increased interstitial edema versus atypical infection/inflammation.  ??  Small right pleural effusion.    Diagnosis   Date Value Ref Range Status   09/18/2020   Final    A:  Cerebrospinal fluid, cytospin review    -  Bloody specimen with no blasts identified    This electronic signature is attestation that the pathologist personally  reviewed the submitted material(s) and the final diagnosis reflects that evaluation.       Diagnosis   Date Value Ref Range Status   09/18/2020   Final    A:  Cerebrospinal fluid, cytospin review    -  Bloody specimen with no blasts identified    This electronic signature is attestation that the pathologist personally reviewed the submitted material(s) and the final diagnosis reflects that evaluation.     08/07/2020   Final    A:  Cerebrospinal fluid, cytospin review and flow cytometry   -  No blasts identified    This electronic signature is attestation that the pathologist personally reviewed the submitted material(s) and the final diagnosis reflects that evaluation.     07/23/2020   Final    Bone marrow, right iliac, aspiration and biopsy  -   Normocellular bone marrow (50%) with trilineage hematopoiesis and 1% blasts by manual aspirate differential  -   Flow cytometry MRD analysis reveals no definitive immunophenotypic evidence of residual B lymphoblastic leukemia   -  See linked reports for associated Ancillary Studies.      This electronic signature is attestation that the pathologist personally reviewed the submitted material(s) and the final diagnosis reflects that evaluation.

## 2020-11-11 NOTE — Unmapped (Signed)
Labs collected via PICC. Both lines was hep flushed. Dressing changed. Labs sent for analysis.

## 2020-11-13 ENCOUNTER — Ambulatory Visit: Admit: 2020-11-13 | Discharge: 2020-11-14 | Payer: MEDICAID

## 2020-11-13 ENCOUNTER — Telehealth
Admit: 2020-11-13 | Discharge: 2020-11-14 | Payer: MEDICAID | Attending: Infectious Disease | Primary: Infectious Disease

## 2020-11-13 DIAGNOSIS — C9101 Acute lymphoblastic leukemia, in remission: Principal | ICD-10-CM

## 2020-11-13 DIAGNOSIS — B49 Unspecified mycosis: Principal | ICD-10-CM

## 2020-11-13 DIAGNOSIS — R7881 Bacteremia: Principal | ICD-10-CM

## 2020-11-13 DIAGNOSIS — J15212 Pneumonia due to Methicillin resistant Staphylococcus aureus: Principal | ICD-10-CM

## 2020-11-13 DIAGNOSIS — B9562 Methicillin resistant Staphylococcus aureus infection as the cause of diseases classified elsewhere: Principal | ICD-10-CM

## 2020-11-13 MED ORDER — LINEZOLID 600 MG TABLET
ORAL_TABLET | Freq: Two times a day (BID) | ORAL | 0 refills | 30 days | Status: CP
Start: 2020-11-13 — End: 2020-12-13

## 2020-11-13 NOTE — Unmapped (Addendum)
IMMUNOCOMPROMISED HOST INFECTIOUS DISEASE PROGRESS NOTE      The patient reports they are currently: not at home but in [place]. I spent 17 minutes on the real-time audio and video visit with the patient on the date of service. I spent an additional 15 minutes on pre- and post-visit activities on the date of service.     The patient was not located and I was located within 250 yards of a hospital based location during the real-time audio and video visit. The patient was physically located in [place] or a state in which I am permitted to provide care. The patient and/or parent/guardian understood that s/he may incur co-pays and cost sharing, and agreed to the telemedicine visit. The visit was reasonable and appropriate under the circumstances given the patient's presentation at the time.    The patient and/or parent/guardian has been advised of the potential risks and limitations of this mode of treatment (including, but not limited to, the absence of in-person examination) and has agreed to be treated using telemedicine. The patient's/patient's family's questions regarding telemedicine have been answered.    If the visit was completed in an ambulatory setting, the patient and/or parent/guardian has also been advised to contact their provider???s office for worsening conditions, and seek emergency medical treatment and/or call 911 if the patient deems either necessary.      Assessment/Plan:     [name] who presents for hospital follow up.     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  - 4/21 BMBx normocellular bone marrow 50% w/ 1% blasts (MRD)  - Extent of disease/CNS involvement:??rare blast on prior CSF,??intrathecal??ppx??(cytarabine, methotrexate, hydrocortisone)??last??09/18/2020  - Cancer-related complications:??TLS, hyperbilirubinemia, MRSA bacteremia w/ septic emboli/renal failure+dialysis/intubation, C. krusei fungemia  - Prior chemotherapy:??GRAAPPH-2005??induction??with dasatinib??(vincristine, dexamethasone and dasatinib);??C1D1 01/24/2020; difficulty tolerating single-agent dasatinib  - Current chemotherapy:??blinatumomab (anti-CD19/CD3),??C1D1 = 3/18 (symtomatic, titrated up dose and restarted 3/21) + ponatinib (TKI) s/p cycle 3/5    Pertinent Co-morbidities  # COPD  # DIHS/DRESS, 06/12/2020, relapse 11/02/2020  - ??followed by Dr. Caryn Section, dermatology  - per prior ID notes possible culprits ceftaroline,??posaconazole, dasatinib, sotrovimab  - 8/1 increased pred from 15 -> 20mg  daily with resolution of rash  ????  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  Infection History  Active infections:??  #MRSA RLL PNA complicated by bacteremia, 11/01/2020  - 7/31 BCx 1/2 Staph aureus  - 7/31 LRCx Staph aureus  - 8/01 TTE without evidence of endocarditis  - 8/3 S/p Port-a-cath removal; cx tip no growth  - 8/12 fever, worsening right chest pain  - 8/16 CXR possible right empyema  Rx: 7/31 linezolid/cefepime??-->??linezolid/daptomycin/cefepime -->8/02 linezolid/daptomycin -> 8/89 dapto -> 8/12 linezolid+dapto    #Possible COVID-18 associated fungal infection,??07/17/20  - 4/16 CT Chest: multifocal somewhat nodular consolidative opacities in b/l lungs that are relatively new, most concerning for infectious PNA (possibly fungal, especially given his recent high dose steroid taper)  - 4/18 bronch with ZOX:WRUE cx + MRSA, OP flora,??RPP negative,??Legionella cx, AFB cx, fungal cx, CMV PCR, PJP DFA, Asp??galactomannan??neg  - 7/28 CT chest -Linear bands of scarring and vague groundglass opacities are all that remain in the various sites of nodular consolidation evident on prior imaging.  Rx: 4/18 cefepime/isavu/linezolid -> 4/19 linezolid??--> 4/21 isavuconazole/linezolid --> 5/10 isavuconazole -->  ??  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  Recommendations:  New fever  Concerning MRSA pneumonia relapse in the setting of right chest pain  He needs to come in for blood cultures (?PICC line infection) if fever does not resolve within 24h of restarting linezolid    For MRSA PNA and bacteremia  ?? CONT daptomycin 10mg /kg daily for 14 day course from date of port removal (EOT 8/17) for treatment of bacteremia, then remove PICC line with home health  ?? Restart linezolid 600mg  bid for 2-4 pending platelet levels (may then change to doxy or tedizolid)  ?? 2V CXR in the next few days (ordered) -> if increased effusion he will need to be admitted for thoracentesis  ?? He will need weekly CBC while on linezolid    For hx pulmonary nodules  He has now completed 3 month of isavuconazole therapy and can discontinue this since he had improvement of disease on 7/28 CT chest. However, I do not think that we should stop until we sort this new fever and we need to discuss with oncology pharmacy whether TKI adjustment is needed on discontinuation of isavuconazole.   ??  Prophylaxis  ?? CONT valacyclovir  ?? CONT Bactrim 1 DS tab MWF  ??  Immunizations  ?? COVID-19 booster, due for redosing of Evusheld  ?? Prevnar-20  ?? Shingrix  He should get these all locally or with next inpatient visit    Follow up pending CXR results.     Addendum 11/17/2020  CXR review - concerning for possible right loculated empyema  Wbc increased, Cr increased  Recommended admission for CT chest, possible thoracentesis/VATS PRN  Continue linezolid/daptomycin  PharmD pricing out tedizolid    Subjective     Interval History:    I reviewed and summarized the medical records from hospitalization on 7/31-8/8 which illustrated hospitalization and ICU stay for MRSA pneumonia with bacteremia.   Complicated by intertrigo and DRESS flare with bump in steroids. He received 7 days of linezolid and daptomycin continued via PICC at d/c after port removed.   Breathing is shallow due to right-sided pain. Sometimes bring up something ~1 time per day. Right-sided pain. Fever last night 100.7. Not feeling sick.  Rash improved. No joint pain. Muscle aches.     Medications:  Antimicrobials: dapto 10, isa 372, valtrex 500, tmp/smx DS MWF  Prior/Current immunomodulators: prednisone 20mg  daily  Other medications reviewed.    Objective     Physical Exam:  GEN:  looks well, no apparent distress  EYES: sclerae anicteric and non injected  ENT:no thrush, leukoplakia or oral lesions  Prior port site c/d/i well-healing  PULM:normal work of breathing at rest  SKIN:no petechiae, ecchymoses or obvious rashes on clothed exam  NEURO:no tremor noted, facial expression symmetric and moves extremities equally  PSYCH:attentive, appropriate affect, good eye contact, fluent speech    Labs:  Lab Results   Component Value Date    WBC 13.9 (H) 11/11/2020    WBC 9.8 11/10/2020  WBC 13.1 (H) 11/09/2020    HGB 11.5 (L) 11/11/2020    Hemoglobin 11.0 (L) 11/03/2020    HCT 34.2 (L) 11/11/2020    Platelet 289 11/11/2020    Absolute Neutrophils 11.9 (H) 11/11/2020    Absolute Lymphocytes 1.2 11/11/2020    Absolute Eosinophils 0.2 11/11/2020    Sodium 135 11/11/2020    Sodium Whole Blood 137 11/03/2020    Potassium 4.0 11/11/2020    Potassium, Bld 3.8 11/03/2020    BUN 19 11/11/2020    Creatinine 1.63 (H) 11/11/2020 Creatinine 1.58 (H) 11/10/2020    Creatinine 1.56 (H) 11/09/2020    Glucose 104 11/11/2020    Magnesium 1.8 11/10/2020    Albumin 3.1 (L) 11/11/2020    Total Bilirubin 0.2 (L) 11/11/2020    AST 28 11/11/2020    ALT 51 (H) 11/11/2020    Alkaline Phosphatase 186 (H) 11/11/2020    INR 0.92 11/04/2020    Sed Rate 30 (H) 07/18/2020    CRP 15.0 (H) 10/20/2020    CRP 53.0 (H) 10/06/2020       Microbiology:  No new positives  Neg cath tip    Imaging:  8/5 CXR FINDINGS:  Portacatheter is been removed. No pneumothorax or soft tissue emphysema. Decreased airspace opacities. Increased right effusion.  IMPRESSION: Improved.    7/28 CT chest  LUNGS AND AIRWAYS:  -Similar advanced destructive changes of centrilobular emphysema  -Similar diffuse bronchial wall thickening  -Linear bands of scarring and vague groundglass opacities are all that remain in the various sites of nodular consolidation evident on prior imaging.  -No suspicious pulmonary nodules  -No consolidative airspace disease

## 2020-11-13 NOTE — Unmapped (Signed)
Reviewed Meds and Allergies, confirmed pharmacy and informed how to use Doximity.

## 2020-11-13 NOTE — Unmapped (Signed)
Addended by: Darlen Round on: 11/13/2020 10:45 AM     Modules accepted: Level of Service

## 2020-11-14 NOTE — Unmapped (Signed)
See Mychart messages

## 2020-11-14 NOTE — Unmapped (Signed)
Addended by: Darlen Round on: 11/13/2020 09:04 PM     Modules accepted: Level of Service

## 2020-11-17 ENCOUNTER — Ambulatory Visit
Admit: 2020-11-17 | Discharge: 2020-11-20 | Disposition: A | Discharge status: Home / Self Care | Payer: MEDICAID | Admitting: Internal Medicine

## 2020-11-17 ENCOUNTER — Ambulatory Visit: Admit: 2020-11-17 | Discharge: 2020-11-18 | Payer: MEDICAID

## 2020-11-17 ENCOUNTER — Encounter: Admit: 2020-11-17 | Discharge: 2020-11-17 | Payer: MEDICAID

## 2020-11-17 ENCOUNTER — Encounter
Admit: 2020-11-17 | Discharge: 2020-11-20 | Disposition: A | Discharge status: Home / Self Care | Payer: MEDICAID | Admitting: Internal Medicine

## 2020-11-17 DIAGNOSIS — J869 Pyothorax without fistula: Principal | ICD-10-CM

## 2020-11-17 DIAGNOSIS — R530 Neoplastic (malignant) related fatigue: Principal | ICD-10-CM

## 2020-11-17 DIAGNOSIS — C9100 Acute lymphoblastic leukemia not having achieved remission: Principal | ICD-10-CM

## 2020-11-17 LAB — CBC W/ AUTO DIFF
BASOPHILS ABSOLUTE COUNT: 0.2 10*9/L — ABNORMAL HIGH (ref 0.0–0.1)
BASOPHILS RELATIVE PERCENT: 1.5 %
EOSINOPHILS ABSOLUTE COUNT: 0.1 10*9/L (ref 0.0–0.5)
EOSINOPHILS RELATIVE PERCENT: 0.6 %
HEMATOCRIT: 36.4 % — ABNORMAL LOW (ref 39.0–48.0)
HEMOGLOBIN: 12.2 g/dL — ABNORMAL LOW (ref 12.9–16.5)
LYMPHOCYTES ABSOLUTE COUNT: 1.3 10*9/L (ref 1.1–3.6)
LYMPHOCYTES RELATIVE PERCENT: 8.1 %
MEAN CORPUSCULAR HEMOGLOBIN CONC: 33.6 g/dL (ref 32.0–36.0)
MEAN CORPUSCULAR HEMOGLOBIN: 31.3 pg (ref 25.9–32.4)
MEAN CORPUSCULAR VOLUME: 93.1 fL (ref 77.6–95.7)
MEAN PLATELET VOLUME: 8.9 fL (ref 6.8–10.7)
MONOCYTES ABSOLUTE COUNT: 0.5 10*9/L (ref 0.3–0.8)
MONOCYTES RELATIVE PERCENT: 3.1 %
NEUTROPHILS ABSOLUTE COUNT: 13.7 10*9/L — ABNORMAL HIGH (ref 1.8–7.8)
NEUTROPHILS RELATIVE PERCENT: 86.7 %
PLATELET COUNT: 380 10*9/L (ref 150–450)
RED BLOOD CELL COUNT: 3.91 10*12/L — ABNORMAL LOW (ref 4.26–5.60)
RED CELL DISTRIBUTION WIDTH: 15.4 % — ABNORMAL HIGH (ref 12.2–15.2)
WBC ADJUSTED: 15.8 10*9/L — ABNORMAL HIGH (ref 3.6–11.2)

## 2020-11-17 LAB — COMPREHENSIVE METABOLIC PANEL
ALBUMIN: 3.5 g/dL (ref 3.4–5.0)
ALKALINE PHOSPHATASE: 150 U/L — ABNORMAL HIGH (ref 46–116)
ALT (SGPT): 41 U/L (ref 10–49)
ANION GAP: 11 mmol/L (ref 5–14)
AST (SGOT): 24 U/L (ref <=34)
BILIRUBIN TOTAL: 0.2 mg/dL — ABNORMAL LOW (ref 0.3–1.2)
BLOOD UREA NITROGEN: 18 mg/dL (ref 9–23)
BUN / CREAT RATIO: 10
CALCIUM: 9.6 mg/dL (ref 8.7–10.4)
CHLORIDE: 104 mmol/L (ref 98–107)
CO2: 20 mmol/L (ref 20.0–31.0)
CREATININE: 1.87 mg/dL — ABNORMAL HIGH
EGFR CKD-EPI (2021) MALE: 46 mL/min/{1.73_m2} — ABNORMAL LOW (ref >=60)
GLUCOSE RANDOM: 89 mg/dL (ref 70–179)
POTASSIUM: 4.4 mmol/L (ref 3.4–4.8)
PROTEIN TOTAL: 7.3 g/dL (ref 5.7–8.2)
SODIUM: 135 mmol/L (ref 135–145)

## 2020-11-17 LAB — SLIDE REVIEW

## 2020-11-17 LAB — CK: CREATINE KINASE TOTAL: 33 U/L — ABNORMAL LOW

## 2020-11-17 MED ORDER — TEDIZOLID 200 MG TABLET
ORAL_TABLET | Freq: Every day | ORAL | 2 refills | 30 days | Status: CP
Start: 2020-11-17 — End: ?

## 2020-11-17 NOTE — Unmapped (Signed)
I have contacted the patient to review/clarify plan of care going forward as previously mentioned in a [facility] message that was received to the clinic the other day.    ?? Reassuringly, Adam Keith continues to take oral linezolid and IV daptomycin as prescribed without any missed doses.    ?? Since speaking with Dr. [name] on Friday, November 13, 2020, the patient reports the following improvements:  ?? No fevers since Saturday, August 13.  ?? Pain/discomfort that he was previously experiencing is noted to be significantly better with a current pain score between 0-4 with Adam Keith emphasizing not experiencing any discomfort over a 5 out of 10.    ?? It was reviewed with the patient that Dr. [name] has recommended that a 2 view chest x-ray be completed as quickly as possible.  ?? Adam Keith is in agreement with this recommendation and anticipates having a two-view chest x-ray completed later today, Tuesday November 17, 2020 at [facility].    ?? Follow-up with the Leukemia Care Team remains tentatively set next for ~12/14/2020    Otherwise, the patient expressed verbal understanding and had no other questions or concerns at this time.  He is in agreement with the plan outlined above and will contact clinic back sooner should he have any other issues.    No other actions taken and this updates been shared with the care team.

## 2020-11-17 NOTE — Unmapped (Signed)
As directed by Dr. [name], the following has been initiated:    ?? Repeat diagnostic x-ray imaging and lab work completed earlier today have been reviewed and warrant an urgent change in the plan of care.    ?? Recommendation has been made for Adam Keith to be directly admitted to the hospital for continued diagnostic work-up and evaluation of suspected empyema.    ?? Moreover, the plan of care is also been reviewed with the patient's primary treating oncologist, Dr. Benita Gutter who is in agreement and amenable to excepting Adam Keith on his service for medical management.  ?? In the event that direct admission is not a possibility today, Adam Keith has been advised to proceed with evaluation in the emergency department given this recent update to the plan of care.    Otherwise, the patient expressed verbal understanding and had no other questions or concerns at this time.  He is in agreement with the plan outlined above and will contact clinic back sooner should he have any other issues.    No other actions taken and this updates been shared with [name] & Dr. [name].

## 2020-11-17 NOTE — Unmapped (Signed)
Nortriptyline level ordered.

## 2020-11-18 DIAGNOSIS — G4700 Insomnia, unspecified: Principal | ICD-10-CM

## 2020-11-18 DIAGNOSIS — J869 Pyothorax without fistula: Principal | ICD-10-CM

## 2020-11-18 DIAGNOSIS — F331 Major depressive disorder, recurrent, moderate: Principal | ICD-10-CM

## 2020-11-18 DIAGNOSIS — F419 Anxiety disorder, unspecified: Principal | ICD-10-CM

## 2020-11-18 LAB — COMPREHENSIVE METABOLIC PANEL
ALBUMIN: 3.5 g/dL (ref 3.4–5.0)
ALKALINE PHOSPHATASE: 128 U/L — ABNORMAL HIGH (ref 46–116)
ALT (SGPT): 40 U/L (ref 10–49)
ANION GAP: 6 mmol/L (ref 5–14)
AST (SGOT): 21 U/L (ref <=34)
BILIRUBIN TOTAL: 0.2 mg/dL — ABNORMAL LOW (ref 0.3–1.2)
BLOOD UREA NITROGEN: 17 mg/dL (ref 9–23)
BUN / CREAT RATIO: 10
CALCIUM: 9.8 mg/dL (ref 8.7–10.4)
CHLORIDE: 108 mmol/L — ABNORMAL HIGH (ref 98–107)
CO2: 23 mmol/L (ref 20.0–31.0)
CREATININE: 1.74 mg/dL — ABNORMAL HIGH
EGFR CKD-EPI (2021) MALE: 50 mL/min/{1.73_m2} — ABNORMAL LOW (ref >=60)
GLUCOSE RANDOM: 94 mg/dL (ref 70–179)
POTASSIUM: 4.4 mmol/L (ref 3.4–4.8)
PROTEIN TOTAL: 7.6 g/dL (ref 5.7–8.2)
SODIUM: 137 mmol/L (ref 135–145)

## 2020-11-18 LAB — CBC W/ AUTO DIFF
BASOPHILS ABSOLUTE COUNT: 0.1 10*9/L (ref 0.0–0.1)
BASOPHILS RELATIVE PERCENT: 0.6 %
EOSINOPHILS ABSOLUTE COUNT: 0.1 10*9/L (ref 0.0–0.5)
EOSINOPHILS RELATIVE PERCENT: 0.6 %
HEMATOCRIT: 35.2 % — ABNORMAL LOW (ref 39.0–48.0)
HEMOGLOBIN: 12 g/dL — ABNORMAL LOW (ref 12.9–16.5)
LYMPHOCYTES ABSOLUTE COUNT: 2.8 10*9/L (ref 1.1–3.6)
LYMPHOCYTES RELATIVE PERCENT: 18 %
MEAN CORPUSCULAR HEMOGLOBIN CONC: 34.2 g/dL (ref 32.0–36.0)
MEAN CORPUSCULAR HEMOGLOBIN: 31.5 pg (ref 25.9–32.4)
MEAN CORPUSCULAR VOLUME: 91.8 fL (ref 77.6–95.7)
MEAN PLATELET VOLUME: 8.6 fL (ref 6.8–10.7)
MONOCYTES ABSOLUTE COUNT: 1.1 10*9/L — ABNORMAL HIGH (ref 0.3–0.8)
MONOCYTES RELATIVE PERCENT: 6.8 %
NEUTROPHILS ABSOLUTE COUNT: 11.6 10*9/L — ABNORMAL HIGH (ref 1.8–7.8)
NEUTROPHILS RELATIVE PERCENT: 74 %
PLATELET COUNT: 368 10*9/L (ref 150–450)
RED BLOOD CELL COUNT: 3.83 10*12/L — ABNORMAL LOW (ref 4.26–5.60)
RED CELL DISTRIBUTION WIDTH: 16 % — ABNORMAL HIGH (ref 12.2–15.2)
WBC ADJUSTED: 15.7 10*9/L — ABNORMAL HIGH (ref 3.6–11.2)

## 2020-11-18 MED ORDER — NORTRIPTYLINE 75 MG CAPSULE
ORAL_CAPSULE | 4 refills | 0 days
Start: 2020-11-18 — End: ?

## 2020-11-18 MED ADMIN — carvediloL (COREG) tablet 12.5 mg: 12.5 mg | ORAL | @ 19:00:00

## 2020-11-18 MED ADMIN — predniSONE (DELTASONE) tablet 20 mg: 20 mg | ORAL | @ 19:00:00

## 2020-11-18 MED ADMIN — oxyCODONE (ROXICODONE) immediate release tablet 5 mg: 5 mg | ORAL | @ 12:00:00 | Stop: 2020-11-20

## 2020-11-18 MED ADMIN — valACYclovir (VALTREX) tablet 500 mg: 500 mg | ORAL | @ 19:00:00

## 2020-11-18 MED ADMIN — oxyCODONE (ROXICODONE) immediate release tablet 5 mg: 5 mg | ORAL | @ 07:00:00 | Stop: 2020-11-18

## 2020-11-18 MED ADMIN — oxyCODONE (ROXICODONE) immediate release tablet 5 mg: 5 mg | ORAL | @ 19:00:00 | Stop: 2020-11-20

## 2020-11-18 MED ADMIN — isavuconazonium sulfate (CRESEMBA) capsule 372 mg: 372 mg | ORAL | @ 19:00:00

## 2020-11-18 MED ADMIN — sodium chloride (NS) 0.9 % flush 10 mL: 10 mL | INTRAVENOUS | @ 19:00:00

## 2020-11-18 MED ADMIN — aspirin chewable tablet 81 mg: 81 mg | ORAL | @ 19:00:00

## 2020-11-18 MED ADMIN — amLODIPine (NORVASC) tablet 10 mg: 10 mg | ORAL | @ 19:00:00

## 2020-11-18 MED ADMIN — DAPTOmycin (CUBICIN) 1,150 mg in sodium chloride (NS) 0.9 % 50 mL IVPB: 1150 mg | INTRAVENOUS | @ 21:00:00 | Stop: 2020-11-18

## 2020-11-18 MED ADMIN — gabapentin (NEURONTIN) capsule 300 mg: 300 mg | ORAL | @ 19:00:00

## 2020-11-18 MED ADMIN — oxyCODONE (OxyCONTIN) 12 hr crush resistant ER/CR tablet 10 mg: 10 mg | ORAL | @ 05:00:00 | Stop: 2020-11-18

## 2020-11-18 MED ADMIN — linezolid (ZYVOX) tablet 600 mg: 600 mg | ORAL | @ 19:00:00 | Stop: 2020-12-18

## 2020-11-18 NOTE — Unmapped (Signed)
Hematology/Oncology     Attending Physician :  Guerry Bruin, *  Accepting Service  : Oncology/Hematology (MDE)  Reason for Admission: pleuritic chest pain     Problem List:   Patient Active Problem List   Diagnosis   ??? Tobacco use disorder   ??? AKI (acute kidney injury) (CMS-HCC)   ??? Transaminitis   ??? 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 Hyperphosphatemia   ??? MRSA bacteremia   ??? Septic shock due to Staphylococcus aureus (CMS-HCC)   ??? Hyperkalemia   ??? Hiccups   ??? Hypernatremia   ??? Red blood cell antibody positive   ??? Hypokalemia   ??? Hypomagnesemia   ??? COVID-19   ??? Dyspnea   ??? Recurrent major depressive disorder, in partial remission (CMS-HCC)   ??? PTSD (post-traumatic stress disorder)   ??? Anxiety   ??? Insomnia   ??? Fever   ??? Immunocompromised (CMS-HCC)   ??? Hypogammaglobulinemia (CMS-HCC)   ??? ALL (acute lymphoblastic leukemia) (CMS-HCC)   ??? Respiratory failure with hypoxia (CMS-HCC)   ??? Pneumonia   ??? Chronic pain   ??? Acute kidney injury superimposed on CKD (CMS-HCC)   ??? COPD (chronic obstructive pulmonary disease) (CMS-HCC)   ??? Acute lymphoblastic leukemia in remission (CMS-HCC)        Assessment/Plan: Adam Keith is an 40 y.o. 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     Pleuritic chest pain, concerning for empyema: Febrile on 8/12 with right side pleuritic chest pain and started on Linezolid. CXR completed on 8/16 concerning for empyema. Admitted for expedited work up.   - Consulted Interventional pulm   - CT chest ordered   - Continue on ABX as below (Dapto/Linezolid)    MRSA PNA/bacteremia: Complex history with multiple episodes of MRSA PNA/bacteremia requiring intubation, most recent admission on 7/31. Discharged with Daptomycin course with plan to complete course on 8/17, however given new symptoms will continue Daptomycin on admission. Followed by [facility] with Dr. [name] in clinic.   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 Received GRAAPH induction. Induction course was c/b septic shock, candida krusei fungemia, MRSA bacteremia and septic emobli c/b acute renal failure and respiratory distress requriing dialysis and intubation. Post-induction BMBx demonstrated flow-based MRD-negative remission but low-level BCR-ABL persisted. He is s/p cycle 3 Blinatumomab and Ponatinib c/b MRSA pneumonia. Plan is for one IT treatment per cycle. Due for Cycle 4, however on HOLD until after patient's family vacation.   - Ponatinib nightly   - ASA   ??  CKD: Arising from AKI and acute renal failure due to sepsis. Now s/p dialysis. His creatnine remains stable but appears to have developed CKD d/t incompletely healed AKI. Cr upon admission is stable at 2.15. Per chart review, his new baseline over the past few months is ~1.9 - 2.2.   ??  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 Followed by [facility] as an outpatient.   - Isavoriconazole ppx daily as noted above     Hx DRESS:  Followed by Dr. Caryn Section in Dermatology. Takes Prednisone 20 mg  daily.   - Prednisone 20 mg daily     Anxiety/Depression: Chronic condition. Followed by [facility] and sees local provider for ongoing Psychotherapy.   - Nortriptyline 50 mg nightly   - Klonopin 1 mg PO BID PRN     HTN: Chronic condition since diagnosis. Home regimen includes Amlodipine and Coreg.   - Coreg   - Amlodipine     Chronic pain: Takes Oxycontin 15 mg BID and Gabapentin 300 mg TID. Followed by Palliative Care.    - Oxycontin BID   - Gabapentin 300 mg TID Cancer related fatigue:  Patient endorses fatigue with onset of cancer symptoms or treatment.      Immunocompromised status: Patient is immunocompromised secondary to chemotherapy.   -Antimicrobial prophylaxis as above     Impending Electrolyte Abnormality Secondary to Chemotherapy and/or IV Fluids  -Daily Electrolyte monitoring  -Replete per [facility] guidelines.       HPI: Adam Keith is an 40 y.o. 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     Patient reports overall feeling tired but ok. He report have right sided chest pain when he takes a deep breath resulting in some shortness of breath as he cant take deep breaths. He reports a chronic cough that is slightly worse then baseline and is only productive about once a week with a thick dark substance. He otherwise is feeling well.     Denies headache, changes in vision, sore throat, abdominal pain, N/V/D/C, changes in urination, fevers, night sweats or chills.       Review of Systems: All positive and pertinent negatives are noted in the HPI; otherwise all other systems are negative    Oncologic History:   Primary Oncologist: [name]   Oncology History Overview Note   Referring/Local Oncologist: None    Diagnosis:Ph+ ALL    Genetics:    Karyotype/FISH:Abnormal Karyotype: 46,XY,t(9;22)(q34;q11.2)[1]/45,XY,der(7;9)(q10;q10)t(9;22)(q34;q11.2),der(22)t(9;22)[11]/46,sdl,+der(22)t(9;22)[5]/46,XY[3]     Abnormal FISH: A BCR/ABL1 interphase FISH assay shows an abnormal signal pattern in 97% of the 100 cells scored. 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  The findings support a diagnosis of leukemia and have implications for targeted therapy and for monitoring residual disease.      Molecular Genetics:  BCR-ABL1 p210 transcripts were detected at a level of 46.479 IS% ratio in bone marrow.  BCR-ABL1 p190 transcripts were detected at a level of 4 in 100,000 cells in bone marrow.    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)     01/24/2020 - 04/02/2020 Chemotherapy    IP/OP LEUKEMIA GRAAPH-2005 + RITUXIMAB < 60 YO  rituximab hypercvad (odd and even course)     02/24/2020 Remission    CR with PCR MRD+; marrow showing 70% blasts with <1% blasts, negative flow MRD, BCR-ABL p210 PCR 0.197%; CNS negative for blasts     03/09/2020 Progression    CSF - rare blast ID'ed - IT chemo given    03/13/20 - IT chemo, CSF negative     04/16/2020 Biopsy    BM Bx with 40-50% cellularity, <1% blasts, MRD flow  cytometry negative, BCR-ABL p210 transcripts 0.036%     05/28/2020 - 05/28/2020 Chemotherapy    OP AML - CNS THERAPY (INTRATHECAL CYTARABINE, INTRATHECAL METHOTREXATE, OR INTRATHECAL TRIPLE)  Select one of the following: cytarabine IT 100 mg with hydrocortisone 50 mg, cytarabine IT 40 mg with methotrexate 15 mg with hydrocortisone 50 mg, OR methotrexate IT 12 mg with hydrocortisone 50 mg     06/19/2020 -  Chemotherapy    IP/OP LEUKEMIA BLINATUMOMAB 7-DAY INFUSION (MINIMAL RESIDUAL DISEASE; WT >= 22 KG) (HOME INFUSION)      Cycles 1*-4: Blinatumomab 28 mcg/day Days 1-28 of 6-week cycle.  *Given in the inpatient setting on Days 1-3 on Cycle 1 and Days 1-2 on Cycle 2, while other treatment days are given in the outpatient setting.    Cycle 1 MRD Dosing     07/18/2020 Adverse Reaction    Hospitalization: fevers. Pneumonia     07/23/2020 Biopsy    Diagnosis  Bone marrow, right iliac, aspiration and biopsy  -   Normocellular bone marrow (50%) with trilineage hematopoiesis and 1% blasts by manual aspirate differential  -   Flow cytometry MRD analysis reveals no definitive immunophenotypic evidence of residual B lymphoblastic leukemia   - BCR-ABL p210 0.006%         08/10/2020 -  Chemotherapy    Cycle 2 blinatumomab-ponatinib  Ponatinib 30mg     1 IT per cycle     09/21/2020 Adverse Reaction    Covid-19, symptomatic but not requiring hospitalization.     10/06/2020 -  Chemotherapy    Cycle 3 blinatumomab-ponatinib  Ponatinib 30mg        11/01/2020 Adverse Reaction    Hospitalization: MRSA Pneumonia requiring intubation    Blinatumomab stopped.  Ponatinib held until he stabilized.       ALL (acute lymphoblastic leukemia) (CMS-HCC)   06/11/2020 -  Chemotherapy    IP/OP LEUKEMIA BLINATUMOMAB 7-DAY INFUSION (MINIMAL RESIDUAL DISEASE; WT >= 22 KG) (HOME INFUSION)  Cycles 1*-4: Blinatumomab 28 mcg/day Days 1-28 of 6-week cycle.  *Given in the inpatient setting on Days 1-3 on Cycle 1 and Days 1-2 on Cycle 2, while other treatment days are given in the outpatient setting.     09/21/2020 Initial Diagnosis    ALL (acute lymphoblastic leukemia) (CMS-HCC)         Medical History:  PCP: Dierdre Harness, MD  Past Medical History:   Diagnosis Date   ??? 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 BONE MARROW BIOPSY & ASPIRATION  01/21/2020        ??? IR INSERT PORT AGE GREATER THAN 5 YRS  03/10/2020    IR INSERT PORT AGE GREATER THAN 5 YRS 03/10/2020 [name], MD IMG VIR [facility]   ??? PR BRONCHOSCOPY,DIAGNOSTIC W LAVAGE Bilateral 07/20/2020    Procedure: BRONCHOSCOPY, RIGID OR FLEXIBLE, INCLUDE FLUOROSCOPIC GUIDANCE WHEN PERFORMED; W/BRONCHIAL ALVEOLAR LAVAGE WITH MODERATE SEDATION;  Surgeon: Dellis Filbert, MD;  Location: [facility] PROCED[facility];  Service: Pulmonary      Social History:  Social History     Socioeconomic History   ??? Marital status: Married   Tobacco Use   ??? Smoking status: Former Smoker     Packs/day: 2.00     Types: Cigarettes Quit date: 01/20/2020     Years since quitting: 0.8   ??? Smokeless tobacco: Former Neurosurgeon     Types: Sports administrator  Vaping Use   ??? Vaping Use: Never used   Other Topics Concern   ??? Do you use sunscreen? No   ??? Tanning bed use? No   ??? Are you easily burned? Yes   ??? Excessive sun exposure? No   ??? Blistering sunburns? Yes     Social Determinants of Health     Financial Resource Strain: Low Risk    ??? Difficulty of Paying Living Expenses: Not very hard   Food Insecurity: No Food Insecurity   ??? Worried About Running Out of Food in the Last Year: Never true   ??? Ran Out of Food in the Last Year: Never true   Transportation Needs: No Transportation Needs   ??? Lack of Transportation (Medical): No   ??? Lack of Transportation (Non-Medical): No      Family History:   No family history of blood cancers or blood disorders.      Allergies: is allergic to bupropion hcl, cefepime, ceftaroline fosamil, dapsone, onion, vancomycin analogues, bismuth subsalicylate, and furosemide.    Medications:   Meds:  ??? amLODIPine  10 mg Oral Daily   ??? aspirin  81 mg Oral Daily   ??? carvediloL  12.5 mg Oral BID   ??? DAPTOmycin (CUBICIN) IVPB in 50 mL  1,150 mg Intravenous Q24H   ??? gabapentin  300 mg Oral TID   ??? isavuconazonium sulfate  372 mg Oral Daily   ??? linezolid  600 mg Oral BID   ??? nortriptyline  75 mg Oral Nightly   ??? oxyCODONE  15 mg Oral Q12H SCH   ??? predniSONE  20 mg Oral Daily   ??? sodium chloride  10 mL Intravenous BID   ??? sodium chloride  10 mL Intravenous BID   ??? sodium chloride  10 mL Intravenous BID   ??? [START ON 11/21/2020] sulfamethoxazole-trimethoprim  1 tablet Oral BID   ??? valACYclovir  500 mg Oral Daily     Continuous Infusions:  ??? sodium chloride       PRN Meds:.clonazePAM, emollient combination no.92, hydrOXYzine, loperamide, loperamide, oxyCODONE, potassium chloride, potassium chloride, potassium chloride    Objective:   Vitals: Temp:  [36.6 ??C (97.8 ??F)-36.8 ??C (98.2 ??F)] 36.6 ??C (97.8 ??F)  Heart Rate:  [80-99] 80  SpO2 Pulse:  [76-85] 80  Resp:  [16-18] 18  BP: (97-169)/(71-118) 144/71  MAP (mmHg):  [80-130] 93  SpO2:  [88 %-100 %] 99 %    Physical Exam:  General: Resting, in no apparent distress, lying in bed  HEENT:  PERRL. No scleral icterus or conjunctival injection. MMM without ulceration, erythema or exudate. No cervical or axillary lymphadenopathy.   Heart:  RRR. S1, S2. No murmurs, gallops, or rubs.  Lungs:  Breathing is unlabored, and patient is speaking full sentences with ease.  No stridor.  Diffuse wheezes throughout with diminished sound over R base. No rales, ronchi, or crackles.    Abdomen:  No distention or pain on palpation.  Bowel sounds are present and normoactive x 4.  No palpable hepatomegaly or splenomegaly.  No palpable masses.  Skin:  No rashes, petechiae or purpura.  No areas of skin breakdown. Warm to touch, dry, smooth, and even.  Musculoskeletal:  No grossly-evident joint effusions or deformities.  Range of motion about the shoulder, elbow, hips and knees is grossly normal.  Psychiatric:  Range of affect is appropriate.    Neurologic:  Alert and oriented to person, place, time and situation.  CNII-CNXII grossly intact.  Extremities:  Appear well-perfused. No clubbing, edema, or cyanosis.  CVAD: PICC - no erythema, nontender; dressing CDI.      Test Results  Recent Labs     11/17/20  1230 11/18/20  0054   WBC 15.8* 15.7*   NEUTROABS 13.7* 11.6*   HGB 12.2* 12.0*   PLT 380 368     Recent Labs     11/17/20  1230 11/18/20  0054   NA 135 137   K 4.4 4.4   CL 104 108*   CO2 20.0 23.0   BUN 18 17   CREATININE 1.87* 1.74*   CALCIUM 9.6 9.8       Imaging:   CT chest: ??  *New 5.5 summary longest diameter lobular masslike opacity in the lateral segment of the right middle lobe which could be postinfectious/inflammatory in etiology.  *New tubular and nodular opacities in the right upper and right middle lobes favored to represent mucus within dilated bronchi.  *New small laterally loculated right pleural effusion with no CT signs of empyema. If there is clinical concern for infection, consider fluid sampling via ultrasound-guided thoracentesis.  *Follow-up noncontrast CT chest is recommended 4-6 weeks after treatment to document resolution of the above findings.    DVT PPX Indicated:yes, intermittent pneumatic compression boots  FEN:  Discharge Plan:  - fluids: no  - electrolytes: stable   - diet: regular     Need for PT: no  Anticipated Discharge: Their home    Code Status:   Full Code    Time spent on counseling/coordination of care: 2 Hours  Total time spent with patient: 81 Minutes    Irena Cords, PA-C  11/18/2020  2:09 PM   Physician Assistant   Hematology/Oncology Department   [facility]   Group Pager: [phone]

## 2020-11-18 NOTE — Unmapped (Shared)
[facility]  Emergency Department Medical Screening Examination     Subjective     Adam [name] with a PMH of Ph+ ALL in remission on ponatinib-blinatumomab continuous infusion presenting for evaluation of Shortness of Breath. Per chart review, the patient was recently admitted to the ICU 11/01/20 - 11/09/20, at which time he was intubated from 11/02/20 to 11/03/20 after failing BiPAP. More recently, he was evaluated by Hem Onc earlier 4 days ago (11/13/20) for a new onset of a fever and R chest pain, concerning for MRSA PNA relapse. He was recommended for CXR, which was obtained on 11/17/20       nontxoc NAD  Direct admission for suspected ***    Abbreviated Review of Systems/Covid Screen  Constitutional: {POSITIVE NEGATIVE:25405} for fever  Respiratory: {POSITIVE NEGATIVE:25405} for cough. {POSITIVE NEGATIVE:25405} for difficulty breathing.    Objective     ED Triage Vitals [11/17/20 2008]   Enc Vitals Group      BP 169/105      Heart Rate 99      SpO2 Pulse       Resp 18      Temp 36.6 ??C (97.9 ??F)      Temp Source Oral      SpO2 100 %      Weight       Height       Head Circumference       Peak Flow       Pain Score       Pain Loc       Pain Edu?       Excl. in GC?         Focused Physical Exam  Constitutional: No acute distress.  Respiratory: Non-labored respirations.  Neurological: Clear speech. No gross focal neurologic deficits are appreciated.  ***  ?  Assessment & Plan     ***    {Blank single:19197::A medical screening exam has been performed. At the time of this evaluation, no emergency medical condition requiring immediate stabilization has been identified nor is there suspicion for imminent decompensation. Appropriate triage protocols will be implemented and a comprehensive ED evaluation with disposition will be completed by a healthcare provider when an appropriate ED location becomes available.,A medical screening exam has been performed. The patient will be moved imminently to an appropriate ED location for continued evaluation and stabilization of a possible emergency medical condition.} The patient is aware that this is an initial encounter only and verbalizes understanding and agreement with the plan.     Emergency Department operations continue to be impacted by the COVID-19 pandemic.     Lorrine Kin  November 17, 2020 10:03 PM

## 2020-11-18 NOTE — Unmapped (Shared)
[name]  Emergency Department Provider Note      ED Clinical Impression      Final diagnoses:   None           Impression, ED Course, Assessment and Plan      Impression: Patient is a 40 y.o. 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     On exam, the patient is chronically ill appearing but in no acute distress. VS are remarkable for mild HTN to 157/118, otherwise not tachycardic, satting appropriately, and afebrile. Exam is reassuring. No increased work of breathing and no use of accessory muscles.     Differential includes ***    Plan for *** . Will give ***.        Additional Medical Decision Making     I have reviewed the vital signs and the nursing notes. Labs and radiology results that were available during my care of the patient were independently reviewed by me and considered in my medical decision making.     I reviewed the patient's prior medical records (11/13/20).     {** Also document the following if done:  I directly visualized and independently interpreted the EKG tracing.   I independently visualized the radiology images.   I discussed the case with the *** consultant.   I discussed the case and plan for continuity of care with the admitting provider.   I discussed the case with the radiologist.   I discussed the case with the ED pharmacist.   I discussed the case with ***.   I obtained additional history from ***.   Use .PROCDOC to document critical care time within this note.  **}    Portions of this record have been created using Scientist, clinical (histocompatibility and immunogenetics). Dictation errors have been sought, but may not have been identified and corrected.  ____________________________________________         History        Chief Complaint  Shortness of Breath      HPI   Adam 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 Per chart review, the patient was recently admitted to the ICU 11/01/20 - 11/09/20, at which time he was intubated from 11/02/20 to 11/03/20 after failing BiPAP. More recently, he was evaluated by Hem Onc earlier 4 days ago (11/13/20) for a new onset of a fever and R chest pain, concerning for MRSA PNA relapse. He was recommended for CXR, which was obtained on 11/17/20 and returned with new pleural based opacity and pleural stripe thickening inferolaterally with the R hemithorax. Given this, the patient was recommended for ED presentation for direct admission.     Past Medical History:   Diagnosis Date   ??? 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 Tobacco use disorder   ??? AKI (acute kidney injury) (CMS-HCC)   ??? Transaminitis   ??? 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 Hyperphosphatemia   ??? MRSA bacteremia   ??? Septic shock due to Staphylococcus aureus (CMS-HCC)   ???  Hyperkalemia   ??? Hiccups   ??? Hypernatremia   ??? Red blood cell antibody positive   ??? Hypokalemia   ??? Hypomagnesemia   ??? COVID-19   ??? Dyspnea   ??? Recurrent major depressive disorder, in partial remission (CMS-HCC)   ??? PTSD (post-traumatic stress disorder)   ??? Anxiety   ??? Insomnia   ??? Fever   ??? Immunocompromised (CMS-HCC)   ??? Hypogammaglobulinemia (CMS-HCC)   ??? ALL (acute lymphoblastic leukemia) (CMS-HCC)   ??? Respiratory failure with hypoxia (CMS-HCC)   ??? Pneumonia   ??? Chronic pain   ??? Acute kidney injury superimposed on CKD (CMS-HCC)   ??? COPD (chronic obstructive pulmonary disease) (CMS-HCC)   ??? Acute lymphoblastic leukemia in remission (CMS-HCC)       Past Surgical History:   Procedure Laterality Date   ??? BONE MARROW BIOPSY & ASPIRATION  01/21/2020        ??? IR INSERT PORT AGE GREATER THAN 5 YRS  03/10/2020    IR INSERT PORT AGE GREATER THAN 5 YRS 03/10/2020 [name], MD IMG VIR H&[facility]   ??? PR BRONCHOSCOPY,DIAGNOSTIC W LAVAGE Bilateral 07/20/2020    Procedure: BRONCHOSCOPY, RIGID OR FLEXIBLE, INCLUDE FLUOROSCOPIC GUIDANCE WHEN PERFORMED; W/BRONCHIAL ALVEOLAR LAVAGE WITH MODERATE SEDATION;  Surgeon: Dellis Filbert, MD;  Location: BRONCH PROCEDURE LAB [facility];  Service: Pulmonary       No current facility-administered medications for this encounter.    Current Outpatient Medications:   ???  acetaminophen (TYLENOL) 325 MG tablet, Take 650 mg by mouth every six (6) hours as needed for pain. , Disp: , Rfl:   ???  amLODIPine (NORVASC) 10 MG tablet, Take 1 tablet (10 mg total) by mouth daily., Disp: 30 tablet, Rfl: 0  ???  aspirin 81 MG chewable tablet, Chew 1 tablet (81 mg total) daily., Disp: 36 tablet, Rfl: 11  ???  carvediloL (COREG) 12.5 MG tablet, TAKE 1 TABLET BY MOUTH TWO TIMES A DAY., Disp: 180 tablet, Rfl: 1  ???  clonazePAM (KLONOPIN) 0.5 MG tablet, Take 1 tablet (0.5 mg total) by mouth two (2) times a day as needed for anxiety or sleep., Disp: 60 tablet, Rfl: 1  ???  gabapentin (NEURONTIN) 300 MG capsule, Take 1 capsule (300 mg total) by mouth Three (3) times a day., Disp: 270 capsule, Rfl: 6  ???  hydrOXYzine (ATARAX) 25 MG tablet, Take 1 tablet (25 mg total) by mouth every six (6) hours as needed., Disp: 60 tablet, Rfl: 1  ???  isavuconazonium sulfate (CRESEMBA) 186 mg cap capsule, Take 2 capsules (372 mg total) by mouth daily., Disp: 56 capsule, Rfl: 2  ???  linezolid (ZYVOX) 600 mg tablet, Take 1 tablet (600 mg total) by mouth Two (2) times a day., Disp: 60 tablet, Rfl: 0  ???  nortriptyline (PAMELOR) 75 MG capsule, Take 1 capsule (75 mg total) by mouth nightly., Disp: 30 capsule, Rfl: 11  ???  OXYCONTIN 15 mg 12 hr crush resistant ER/CR tablet, TAKE 1 TABLET (15 MG TOTAL) BY MOUTH TWO (2) TIMES A DAY FOR 28 DAYS., Disp: , Rfl:   ???  PONATinib (ICLUSIG) 30 mg tablet, Take 1 tablet (30 mg total) by mouth daily. Swallow tablets whole. Do not crush, break, cut or chew tablets., Disp: 30 tablet, Rfl: 5  ???  predniSONE (DELTASONE) 20 MG tablet, Take 1 tablet (20 mg total) by mouth daily for 21 days., Disp: 21 tablet, Rfl: 0  ???  sodium chloride 0.9 % SolP 50 mL with DAPTOmycin  500 mg SolR 1,150 mg, OVERFILL 50 Inj 7 mL, Infuse 1,150 mg into a venous catheter daily., Disp: 11 each, Rfl: 0  ???  sulfamethoxazole-trimethoprim (BACTRIM DS) 800-160 mg per tablet, Take 1 tablet (160 mg of trimethoprim total) by mouth 2 times a day on Saturday, Sunday. For prophylaxis while on chemo., Disp: 48 tablet, Rfl: 3  ???  tedizolid 200 mg Tab, Take 1 tablet (200 mg total) by mouth daily., Disp: 30 tablet, Rfl: 2  ???  valACYclovir (VALTREX) 500 MG tablet, Take 1 tablet (500 mg total) by mouth daily., Disp: 90 tablet, Rfl: 11    Allergies  Bupropion hcl, Cefepime, Ceftaroline fosamil, Dapsone, Onion, Vancomycin analogues, Bismuth subsalicylate, and Furosemide    Family History   Problem Relation Age of Onset   ??? Melanoma Neg Hx    ??? Basal cell carcinoma Neg Hx    ??? Squamous cell carcinoma Neg Hx        Social History  Social History     Tobacco Use   ??? Smoking status: Former Smoker     Packs/day: 2.00     Types: Cigarettes     Quit date: 01/20/2020     Years since quitting: 0.8   ??? Smokeless tobacco: Former Neurosurgeon     Types: Financial planner   ??? Vaping Use: Never used       Review of Systems   Constitutional: Negative for fever.  Eyes: Negative for visual changes.  ENT: Negative for sore throat.  Cardiovascular: Negative for chest pain.  Respiratory: Negative for shortness of breath.  Gastrointestinal: Negative for abdominal pain, vomiting or diarrhea.  Genitourinary: Negative for dysuria.  Musculoskeletal: Negative for back pain.  Skin: Negative for rash.  Neurological: Negative for headaches, focal weakness or numbness.     Physical Exam     This provider entered the patient's room: Yes:    ??? If this provider did not enter the room, a comprehensive physical exam was not able to be performed due to increased infection risk to themselves, other providers, staff and other patients), as well as to conserve personal protective equipment (PPE) utilization during the COVID-19 pandemic.    ??? If this provider did enter the patient room, the following was PPE worn: N95, eye protection and gloves    ED Triage Vitals [11/17/20 2008]   Enc Vitals Group      BP 169/105      Heart Rate 99      SpO2 Pulse       Resp 18      Temp 36.6 ??C (97.9 ??F)      Temp Source Oral      SpO2 100 %     Constitutional: Alert and oriented. Well appearing and in no distress.  Eyes: Conjunctivae are normal.  ENT       Head: Normocephalic and atraumatic.       Nose: No congestion.       Mouth/Throat: Mucous membranes are moist.       Neck: No stridor.  Hematological/Lymphatic/Immunilogical: No cervical lymphadenopathy.  Cardiovascular: Normal rate, regular rhythm. Normal and symmetric distal pulses are present in all extremities.  Respiratory: Normal respiratory effort. Breath sounds are normal.  Gastrointestinal: Soft and nontender. There is no CVA tenderness.  Genitourinary: ***  Musculoskeletal: Normal range of motion in all extremities.       Right lower leg: No tenderness or edema.       Left lower leg: No tenderness  or edema.  Neurologic: Normal speech and language. No gross focal neurologic deficits are appreciated.  Skin: Skin is warm, dry and intact. No rash noted.  Psychiatric: Mood and affect are normal. Speech and behavior are normal.     EKG     ***     Radiology     No orders to display       Procedures     {** Use .PROCDOC to document procedure(s) within this note. **}    Documentation assistance was provided by Lorrine Kin, Scribe, on November 17, 2020 at 11:53 PM for Dewitt Hoes, MD.      {*** note to provider: please use .EDPROVSCRIBEATTEST to enter a scribe attestation}

## 2020-11-18 NOTE — Unmapped (Signed)
INTERVENTIONAL PULMONOLOGY INITIAL CONSULT NOTE    Assessment:     40 year old gentleman with history of ALL, in remission, septic shock secondary to MRSA bacteremia, presents with shortness of breath, found to have a right lower lobe opacity concerning for empyema, therefore IP consulted for effusion management, as well as right lung nodular consolidation    Plan:   - Will proceed with thoracentesis for diagnosis tomorrow  - Obtain chest CT ~ Sunday 8/21, ION scan, with plans for navigation bronchoscopy for sampling right lower lobe nodule, including BAL and cultures  - Please make patient NPO Sunday night    This patient was seen and evaluated with Dr. Erick Colace    Hulen Luster, MD  Interventional Pulmonology Fellow  Division of Pulmonary Diseases and Critical Care Medicine  Pager: [phone]    Please call the Interventional Pulmonary pager at [phone] with any questions.    History:     Requesting Physician and Service: Medicine    Reason for Consult: Empyema, Nodular consolidation    HPI:   Mr. Oshita is a 40 year old with history of ALL, positive Philadelphia chromosome, in remission, on ponatinib-blinatunamomab, COPD, presented as direct admit from Heme/Onc following a CXR that was concerning for pleural effusion.    Reviewing CXR, it appears that the right lower lobe has not been normal for at least a couple of weeks. Patient was recently discharged from the MICU for septic shock, candedemia, MRSA bacteremia, required endotracheal intubation. CT prior to discharged shows that there was bilateral nodular consolidation, some improvement, but now there appears to be a recurrence of the same process versus new infection. Patient was started on daptomycin as well as prednisone for possible DRESS.     At bedside patient is complaining of cough and shortness of breath.     Past Medical History:   Diagnosis Date   ??? 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 BONE MARROW BIOPSY & ASPIRATION  01/21/2020        ??? IR INSERT PORT AGE GREATER THAN 5 YRS  03/10/2020    IR INSERT PORT AGE GREATER THAN 5 YRS 03/10/2020 [name], MD IMG VIR H&V [facility]   ??? PR BRONCHOSCOPY,DIAGNOSTIC W LAVAGE Bilateral 07/20/2020    Procedure: BRONCHOSCOPY, RIGID OR FLEXIBLE, INCLUDE FLUOROSCOPIC GUIDANCE WHEN PERFORMED; W/BRONCHIAL ALVEOLAR LAVAGE WITH MODERATE SEDATION;  Surgeon: Dellis Filbert, MD;  Location: BRONCH PROCEDURE LAB [facility];  Service: Pulmonary       Current Facility-Administered Medications   Medication Dose Route Frequency Provider Last Rate Last Admin   ??? oxyCODONE (ROXICODONE) immediate release tablet 5 mg  5 mg Oral Q4H PRN Laurance Flatten, MD   5 mg at 11/18/20 4782     Current Outpatient Medications   Medication Sig Dispense Refill   ??? acetaminophen (TYLENOL) 325 MG tablet Take 650 mg by mouth every six (6) hours as needed for pain.      ??? amLODIPine (NORVASC) 10 MG tablet Take 1 tablet (10 mg total) by mouth daily. 30 tablet 0   ??? aspirin 81 MG chewable tablet Chew 1 tablet (81 mg total) daily. 36 tablet 11   ??? carvediloL (COREG) 12.5 MG tablet TAKE 1 TABLET BY MOUTH TWO TIMES A DAY. 180 tablet 1   ??? clonazePAM (KLONOPIN) 0.5 MG tablet Take 1 tablet (0.5 mg total) by mouth two (2) times a  day as needed for anxiety or sleep. 60 tablet 1   ??? gabapentin (NEURONTIN) 300 MG capsule Take 1 capsule (300 mg total) by mouth Three (3) times a day. 270 capsule 6   ??? hydrOXYzine (ATARAX) 25 MG tablet Take 1 tablet (25 mg total) by mouth every six (6) hours as needed. 60 tablet 1   ??? isavuconazonium sulfate (CRESEMBA) 186 mg cap capsule Take 2 capsules (372 mg total) by mouth daily. 56 capsule 2   ??? linezolid (ZYVOX) 600 mg tablet Take 1 tablet (600 mg total) by mouth Two (2) times a day. 60 tablet 0   ??? nortriptyline (PAMELOR) 75 MG capsule Take 1 capsule (75 mg total) by mouth nightly. 30 capsule 11   ??? OXYCONTIN 15 mg 12 hr crush resistant ER/CR tablet TAKE 1 TABLET (15 MG TOTAL) BY MOUTH TWO (2) TIMES A DAY FOR 28 DAYS.     ??? PONATinib (ICLUSIG) 30 mg tablet Take 1 tablet (30 mg total) by mouth daily. Swallow tablets whole. Do not crush, break, cut or chew tablets. 30 tablet 5   ??? predniSONE (DELTASONE) 20 MG tablet Take 1 tablet (20 mg total) by mouth daily for 21 days. 21 tablet 0   ??? sodium chloride 0.9 % SolP 50 mL with DAPTOmycin 500 mg SolR 1,150 mg, OVERFILL 50 Inj 7 mL Infuse 1,150 mg into a venous catheter daily. 11 each 0   ??? sulfamethoxazole-trimethoprim (BACTRIM DS) 800-160 mg per tablet Take 1 tablet (160 mg of trimethoprim total) by mouth 2 times a day on Saturday, Sunday. For prophylaxis while on chemo. 48 tablet 3   ??? tedizolid 200 mg Tab Take 1 tablet (200 mg total) by mouth daily. 30 tablet 2   ??? valACYclovir (VALTREX) 500 MG tablet Take 1 tablet (500 mg total) by mouth daily. 90 tablet 11       Allergies as of 11/17/2020 - Reviewed 11/17/2020   Allergen Reaction Noted   ??? Bupropion hcl Other (See Comments) 12/23/2019   ??? Cefepime Rash 11/04/2020   ??? Ceftaroline fosamil Rash and Other (See Comments) 03/03/2020   ??? Dapsone Other (See Comments) and Anaphylaxis 02/29/2020   ??? Onion Anaphylaxis 01/26/2020   ??? Vancomycin analogues  03/16/2020   ??? Bismuth subsalicylate Nausea And Vomiting 08/07/2013   ??? Furosemide  04/13/2020       Family History   Problem Relation Age of Onset   ??? Melanoma Neg Hx    ??? Basal cell carcinoma Neg Hx    ??? Squamous cell carcinoma Neg Hx        Social History     Socioeconomic History   ??? Marital status: Married   Tobacco Use   ??? Smoking status: Former Smoker     Packs/day: 2.00     Types: Cigarettes     Quit date: 01/20/2020     Years since quitting: 0.8   ??? Smokeless tobacco: Former Neurosurgeon     Types: Financial planner   ??? Vaping Use: Never used   Other Topics Concern   ??? Do you use sunscreen? No   ??? Tanning bed use? No   ??? Are you easily burned? Yes   ??? Excessive sun exposure? No   ??? Blistering sunburns? Yes     Social Determinants of Health     Financial Resource Strain: Low Risk    ??? Difficulty of Paying Living Expenses: Not very hard   Food Insecurity: No Food Insecurity   ???  Worried About Programme researcher, broadcasting/film/video in the Last Year: Never true   ??? Ran Out of Food in the Last Year: Never true   Transportation Needs: No Transportation Needs   ??? Lack of Transportation (Medical): No   ??? Lack of Transportation (Non-Medical): No     Review of Systems  A 12 point review of systems was negative except for pertinent items noted in the HPI.    Objective:     Physical Examination:   BP 144/71  - Pulse 80  - Temp 36.6 ??C (97.8 ??F) (Oral)  - Resp 18  - SpO2 99%   General appearance - alert, well appearing, and in no distress  Eyes - Sclera anicteric, conjunctiva pink  Mouth - mucous membranes moist, pharynx normal without lesions  Neck - Trachea supple and midline, (-) JVD   Lymphatics - no palpable lymphadenopathy  Heart - normal rate, regular rhythm, normal S1, S2, no murmurs, rubs, clicks or gallops  Chest - clear to auscultation, no wheezes, rales or rhonchi, symmetric air entry  Abdomen - soft, nontender, nondistended, no masses or organomegaly  Extremities - No pedal edema, no clubbing or cyanosis  Skin - normal coloration and turgor, no rashes, no suspicious skin lesions noted  Neurological - alert, oriented, normal speech, no focal findings or movement disorder noted    Labs and Imaging:  All pertinent labs and imaging reviewed

## 2020-11-18 NOTE — Unmapped (Signed)
INTERVENTIONAL PULMONOLOGY INITIAL CONSULT NOTE    Assessment:     40 year old gentleman with history of ALL, in remission, septic shock secondary to MRSA bacteremia, presents with shortness of breath, found to have a right lower lobe opacity concerning for empyema, therefore IP consulted for effusion management, as well as right lung nodular consolidation    Plan:   - Will proceed with thoracentesis for diagnosis tomorrow  - Obtain chest CT ~ Sunday 8/21, ION scan, with plans for navigation bronchoscopy for sampling right lower lobe nodule, including BAL and cultures  - Please make patient NPO Sunday night    This patient was seen and evaluated with Dr. Erick Colace    Hulen Luster, MD  Interventional Pulmonology Fellow  Division of Pulmonary Diseases and Critical Care Medicine  Pager: [phone]    Please call the Interventional Pulmonary pager at [phone] with any questions.    History:     Requesting Physician and Service: Medicine    Reason for Consult: Empyema, Nodular consolidation    HPI:   Adam Keith is a 40 year old with history of ALL, positive Philadelphia chromosome, in remission, on ponatinib-blinatunamomab, COPD, presented as direct admit from Heme/Onc following a CXR that was concerning for pleural effusion.    Reviewing CXR, it appears that the right lower lobe has not been normal for at least a couple of weeks. Patient was recently discharged from the MICU for septic shock, candedemia, MRSA bacteremia, required endotracheal intubation. CT prior to discharged shows that there was bilateral nodular consolidation, some improvement, but now there appears to be a recurrence of the same process versus new infection. Patient was started on daptomycin as well as prednisone for possible DRESS.     At bedside patient is complaining of cough and shortness of breath.     Past Medical History:   Diagnosis Date   ??? 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 BONE MARROW BIOPSY & ASPIRATION  01/21/2020        ??? IR INSERT PORT AGE GREATER THAN 5 YRS  03/10/2020    IR INSERT PORT AGE GREATER THAN 5 YRS 03/10/2020 [name], MD IMG VIR [facility]   ??? PR BRONCHOSCOPY,DIAGNOSTIC W LAVAGE Bilateral 07/20/2020    Procedure: BRONCHOSCOPY, RIGID OR FLEXIBLE, INCLUDE FLUOROSCOPIC GUIDANCE WHEN PERFORMED; W/BRONCHIAL ALVEOLAR LAVAGE WITH MODERATE SEDATION;  Surgeon: Dellis Filbert, MD;  Location: BRONCH PROCEDURE LAB [facility];  Service: Pulmonary       Current Facility-Administered Medications   Medication Dose Route Frequency Provider Last Rate Last Admin   ??? oxyCODONE (ROXICODONE) immediate release tablet 5 mg  5 mg Oral Q4H PRN Laurance Flatten, MD   5 mg at 11/18/20 4782     Current Outpatient Medications   Medication Sig Dispense Refill   ??? acetaminophen (TYLENOL) 325 MG tablet Take 650 mg by mouth every six (6) hours as needed for pain.      ??? amLODIPine (NORVASC) 10 MG tablet Take 1 tablet (10 mg total) by mouth daily. 30 tablet 0   ??? aspirin 81 MG chewable tablet Chew 1 tablet (81 mg total) daily. 36 tablet 11   ??? carvediloL (COREG) 12.5 MG tablet TAKE 1 TABLET BY MOUTH TWO TIMES A DAY. 180 tablet 1   ??? clonazePAM (KLONOPIN) 0.5 MG tablet Take 1 tablet (0.5 mg total) by mouth two (2) times a  day as needed for anxiety or sleep. 60 tablet 1   ??? gabapentin (NEURONTIN) 300 MG capsule Take 1 capsule (300 mg total) by mouth Three (3) times a day. 270 capsule 6   ??? hydrOXYzine (ATARAX) 25 MG tablet Take 1 tablet (25 mg total) by mouth every six (6) hours as needed. 60 tablet 1   ??? isavuconazonium sulfate (CRESEMBA) 186 mg cap capsule Take 2 capsules (372 mg total) by mouth daily. 56 capsule 2   ??? linezolid (ZYVOX) 600 mg tablet Take 1 tablet (600 mg total) by mouth Two (2) times a day. 60 tablet 0   ??? nortriptyline (PAMELOR) 75 MG capsule Take 1 capsule (75 mg total) by mouth nightly. 30 capsule 11   ??? OXYCONTIN 15 mg 12 hr crush resistant ER/CR tablet TAKE 1 TABLET (15 MG TOTAL) BY MOUTH TWO (2) TIMES A DAY FOR 28 DAYS.     ??? PONATinib (ICLUSIG) 30 mg tablet Take 1 tablet (30 mg total) by mouth daily. Swallow tablets whole. Do not crush, break, cut or chew tablets. 30 tablet 5   ??? predniSONE (DELTASONE) 20 MG tablet Take 1 tablet (20 mg total) by mouth daily for 21 days. 21 tablet 0   ??? sodium chloride 0.9 % SolP 50 mL with DAPTOmycin 500 mg SolR 1,150 mg, OVERFILL 50 Inj 7 mL Infuse 1,150 mg into a venous catheter daily. 11 each 0   ??? sulfamethoxazole-trimethoprim (BACTRIM DS) 800-160 mg per tablet Take 1 tablet (160 mg of trimethoprim total) by mouth 2 times a day on Saturday, Sunday. For prophylaxis while on chemo. 48 tablet 3   ??? tedizolid 200 mg Tab Take 1 tablet (200 mg total) by mouth daily. 30 tablet 2   ??? valACYclovir (VALTREX) 500 MG tablet Take 1 tablet (500 mg total) by mouth daily. 90 tablet 11       Allergies as of 11/17/2020 - Reviewed 11/17/2020   Allergen Reaction Noted   ??? Bupropion hcl Other (See Comments) 12/23/2019   ??? Cefepime Rash 11/04/2020   ??? Ceftaroline fosamil Rash and Other (See Comments) 03/03/2020   ??? Dapsone Other (See Comments) and Anaphylaxis 02/29/2020   ??? Onion Anaphylaxis 01/26/2020   ??? Vancomycin analogues  03/16/2020   ??? Bismuth subsalicylate Nausea And Vomiting 08/07/2013   ??? Furosemide  04/13/2020       Family History   Problem Relation Age of Onset   ??? Melanoma Neg Hx    ??? Basal cell carcinoma Neg Hx    ??? Squamous cell carcinoma Neg Hx        Social History     Socioeconomic History   ??? Marital status: Married   Tobacco Use   ??? Smoking status: Former Smoker     Packs/day: 2.00     Types: Cigarettes     Quit date: 01/20/2020     Years since quitting: 0.8   ??? Smokeless tobacco: Former Neurosurgeon     Types: Financial planner   ??? Vaping Use: Never used   Other Topics Concern   ??? Do you use sunscreen? No   ??? Tanning bed use? No   ??? Are you easily burned? Yes   ??? Excessive sun exposure? No   ??? Blistering sunburns? Yes     Social Determinants of Health     Financial Resource Strain: Low Risk    ??? Difficulty of Paying Living Expenses: Not very hard   Food Insecurity: No Food Insecurity   ???  Worried About Programme researcher, broadcasting/film/video in the Last Year: Never true   ??? Ran Out of Food in the Last Year: Never true   Transportation Needs: No Transportation Needs   ??? Lack of Transportation (Medical): No   ??? Lack of Transportation (Non-Medical): No     Review of Systems  A 12 point review of systems was negative except for pertinent items noted in the HPI.    Objective:     Physical Examination:   BP 144/71  - Pulse 80  - Temp 36.6 ??C (97.8 ??F) (Oral)  - Resp 18  - SpO2 99%   General appearance - alert, well appearing, and in no distress  Eyes - Sclera anicteric, conjunctiva pink  Mouth - mucous membranes moist, pharynx normal without lesions  Neck - Trachea supple and midline, (-) JVD   Lymphatics - no palpable lymphadenopathy  Heart - normal rate, regular rhythm, normal S1, S2, no murmurs, rubs, clicks or gallops  Chest - clear to auscultation, no wheezes, rales or rhonchi, symmetric air entry  Abdomen - soft, nontender, nondistended, no masses or organomegaly  Extremities - No pedal edema, no clubbing or cyanosis  Skin - normal coloration and turgor, no rashes, no suspicious skin lesions noted  Neurological - alert, oriented, normal speech, no focal findings or movement disorder noted    Labs and Imaging:  All pertinent labs and imaging reviewed

## 2020-11-18 NOTE — Unmapped (Signed)
States his cancer doctors told him to come in for a direct admit but no beds ready related to xray showing fluid on lungs. Discharged one week ago and was on vent. On Chemo. Shortness of breath.

## 2020-11-18 NOTE — Unmapped (Signed)
Patient NAD. Pt refused to sign AMA form. Stated that he had spoken with his doctor who had told him he would give him a call when his room was ready. Pt was explained that if he left he would have to re register and go back through the ED registration process again. Pt was adamant that doctor told him he would receive a call when room was ready. Pt ambulated through ED in stable condition with wife.

## 2020-11-18 NOTE — Unmapped (Signed)
NEW IMMUNOCOMPROMISED HOST INFECTIOUS DISEASE CONSULT NOTE      Adam Keith is being seen in consultation at the request of Guerry Bruin, * for evaluation of MRSA PNA .      Assessment/Recommendations:    Adam [name] male with MRSA PNA/bactermia and Ph+ ALL who is being admitted for worsening pleuritic chest pain and fevers.    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  - 4/21 BMBx normocellular bone marrow 50% w/ 1% blasts (MRD)  - Extent of disease/CNS involvement:??rare blast on prior CSF,??intrathecal??ppx??(cytarabine, methotrexate, hydrocortisone)??last??09/18/2020  - Cancer-related complications:??TLS, hyperbilirubinemia, MRSA bacteremia w/ septic emboli/renal failure+dialysis/intubation, C. krusei fungemia  - Prior chemotherapy:??GRAAPPH-2005??induction??with dasatinib??(vincristine, dexamethasone and dasatinib);??C1D1 01/24/2020; difficulty tolerating single-agent dasatinib  - Current chemotherapy:??blinatumomab (anti-CD19/CD3),??C1D1 = 3/18 (symtomatic, titrated up dose and restarted 3/21) + ponatinib (TKI)  - currently cycle 3/5  -??Right IJ Port-A-Cath??placed 04/15/2020  - Infection prophylaxis prior to admission:??Bactrim 1 DS MWF, valacyclovir, s/p Evusheld 04/21/2020 x2 doses, isavuconazole  - on pred 20mg  daily PTA as below    Likely DIHS/DRESS, 06/12/2020 and new rash 8/01  -??followed by Dr. Caryn Section, dermatology  - 3/11 punch bx  - per prior ID notes possible culprits ceftaroline,??posaconazole, dasatinib, sotrovimab  - received high dose steroids in 06/2020, 3/3 - 3/7 pred 50 x5d;??06/19/20 started at 50 mg prednisone??taper  -??flare on pred??10 mg daily  - pink rash noted on chest on 8/01, with rising eosinophils. 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  Infection History  Active infections:  #MRSA bacteremia, 11/01/2020  #RLL pulmonary consolidation on 11/01/2020 CXR, worsening effusion/consolidation+/-empyema 11/18/2020  - 7/31 BCx 1/2 Staph aureus  - 7/31 LRCx Staph aureus  - 8/01 TTE without evidence of endocarditis  - S/p Port-a-cath removal on 8/03  - Blood Culture negative since 8/03  - Rx: 7/31 linezolid/cefepime??-->??linezolid/daptomycin/cefepime -->8/02 linezolid/daptomycin--->Discharged home on 8/03 Daptomycin -->8/12 Restarted Linezolid/Daptomycin  -Represented to the hospital 8/17 due to worsening of R pleuritic chest pain      Prior infections    #Non-neutropenic fever and new pulmonary nodules/infiltrates,??07/17/20  - 4/15 BC x2 NGTD  - 4/16 CT Chest: multifocal somewhat nodular consolidative opacities in b/l lungs that are relatively new, most concerning for infectious PNA (possibly fungal, especially given his recent high dose steroid taper)  - 4/17 LRCx +2??MRSA  - 4/17??Legionella??uAg,??serum crypto??Ag, serum??Asp??galactomannan,??urine histo ag,??B-D-glucan (Fungitell),??serum CMV??neg  - 4/18 bronch with WUJ:WJXB cx + MRSA, OP flora,??RPP negative,??Legionella cx, AFB cx, fungal cx, CMV PCR, PJP DFA, Asp??galactomannan??neg  - 7/28 CT chest resolved  Rx:  4/15 cefepime/dapto --> 4/16 cefepime --> 4/18 cefepime/isavu/linezolid -> 4/19 linezolid??--> 4/21 isavuconazole/linezolid --> 5/10 isavuconazole -->  -8/17 : continues on Isavuconazole ( due to DDI with Ponatinib)  ??  #Intertrigo??in groin, 11/01/2020  ??  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  #COVID-19 Pneumonia, pos 05/28/2020 and 09/21/2020  - vaccinated 02/2020, 03/2020  - s/p Evusheld x1 04/21/2020  - prescribed molnupiravir at home, per prior notes did not take  - admitted 2/27 - 06/01/20??for observation, given sotrovimab  ??  #  MRSA??bacteremia + PNA +??TV IE 01/27/20  - bacteremic 10/25-11/6, cleared 11/8  - pyomyositis thoracic spine, L arm  - 10/29 DISRUPT trial, exebacase v. Placebo  Tx: 10/25 vanc/cefepime -->??10/27 dapto/ceftaroline -->??11/5 ceftaroline/vanc ??->??11/16 vanc (stopped due to ototoxicity though other meds were more likely) -->Ceftaroline (w/ 1 dose DAP) 11/28 ->12/02-->Vanc 12/02 -???  ??    Antimicrobial Intolerance/allergy  Cefepime - rash with concern for DRESS flare  Ceftaroline -??DRESS  Dapsone - possible??agranulocytosis, per chart anaphylaxis  Vancomycin - probable ototoxicity (in combination??with furosemide)  Isavuconazole - elevated LFTs in the setting of TKI           RECOMMENDATIONS    Diagnostic    ?? Recommend ultrasound guided thoracocentesis / pleural fluid analysis for diagnosis  ?? Please send pleural fluid for - microscopy/bacterial, fungal and AFB cultures  ?? Please repeat Blood cultures today    Lab monitoring for antimicrobial toxicities  ??? Monitor FBC/ eLFT's while on antimicrobials    Treatment    MRSA Pneumonia/complicated by effusion/empyema  ??? CONT PO Linezolid 600mg  BD  ??? Cease IV Daptomycin   ??? Agree with Pulmonary consult              The [facility] ID service will continue to follow.  Please page the ID Transplant/Liquid Oncology Fellow consult at [phone] with questions.  Patient discussed with Dr. Leanord Hawking      History of Present Illness:      Source of information includes:  Electronic Medical Records.  History obtained from:patient and family member.    Adam Keith is an 40 y.o. male with complex PMHx including MRSA PNA/bactermia, Candida Fungemia, COVID infection and Ph+ ALL who is being admitted for pleuritic chest pain concerning for worsening MRSA Pneumonia /Empyema  ??  Patient reports noticing improvement on discharge but then started experiencing right sided chest pain , pleuritic in nature. He reports a chronic cough that is slightly worse then baseline and is only intermittently productive. He reports mild shortness of breath on exertion without orthopnea or pedal edema. He otherwise is feeling well with no subjective fevers but reports episodes of feeling cold and sweaty.  ??  Denies headache, changes in vision, sore throat, abdominal pain, N/V/D/C, changes in urination, fevers, night sweats or chills.    He has been tolerating Linezolid with no issues and reports not missing doses.    Allergies:  Allergies   Allergen Reactions   ??? Bupropion Hcl Other (See Comments)     Per patient out of touch with reality, suicidal, homicidal   ??? Cefepime Rash     DRESS   ??? Ceftaroline Fosamil Rash and Other (See Comments)     Rash X 2 02/2020, suspected DRESS 06/2020   ??? 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 Onion Anaphylaxis   ??? Vancomycin Analogues      Hearing loss with Lasix  Other reaction(s): Other (See Comments)  Hearing loss  lasix   ??? Bismuth Subsalicylate Nausea And Vomiting   ??? Furosemide      With Vancomycin caused hearing loss  Other reaction(s): Other (See Comments)  With Vancomycin caused hearing loss       Medications:   Antimicrobials:  Anti-infectives (From admission, onward)    Start     Dose/Rate Route Frequency Ordered Stop    11/21/20 0900  sulfamethoxazole-trimethoprim (BACTRIM DS) 800-160 mg tablet 160 mg of trimethoprim  1 tablet Oral 2 times a day on saturday, sunday 11/18/20 1034 12/26/20 0859    11/18/20 1500  DAPTOmycin (CUBICIN) 1,150 mg in sodium chloride (NS) 0.9 % 50 mL IVPB         1,150 mg  160 mL/hr over 30 Minutes Intravenous Every 24 hours 11/18/20 1034 12/02/20 1459    11/18/20 1347  isavuconazonium sulfate (CRESEMBA) capsule 372 mg         372 mg Oral Daily (standard) 11/18/20 1034      11/18/20 1346  linezolid (ZYVOX) tablet 600 mg         600 mg Oral 2 times a day (standard) 11/18/20 1034 12/18/20 0859 11/18/20 1343  valACYclovir (VALTREX) tablet 500 mg         50 0 mg Oral Daily (standard) 11/18/20 1034            Current/Prior immunomodulators:  Blinatumomab, Ponatinib    Other medications reviewed.     Medical History:  Past Medical History:   Diagnosis Date   ??? 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 BONE MARROW BIOPSY & ASPIRATION  01/21/2020        ??? IR INSERT PORT AGE GREATER THAN 5 YRS  03/10/2020    IR INSERT PORT AGE GREATER THAN 5 YRS 03/10/2020 [name], MD IMG VIR H&V [facility]   ??? PR BRONCHOSCOPY,DIAGNOSTIC W LAVAGE Bilateral 07/20/2020    Procedure: BRONCHOSCOPY, RIGID OR FLEXIBLE, INCLUDE FLUOROSCOPIC GUIDANCE WHEN PERFORMED; W/BRONCHIAL ALVEOLAR LAVAGE WITH MODERATE SEDATION;  Surgeon: Dellis Filbert, MD;  Location: BRONCH PROCEDURE LAB [facility];  Service: Pulmonary       Social History:  Tobacco use:   reports that he quit smoking about 9 months ago. His smoking use included cigarettes. He smoked 2.00 packs per day. He has quit using smokeless tobacco.  His smokeless tobacco use included chew.   Alcohol use:    has no history on file for alcohol use.   Drug use:    has no history on file for drug use.   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 Melanoma Neg Hx    ??? Basal cell carcinoma Neg Hx    ??? Squamous cell carcinoma Neg Hx        Review of Systems:  All other  systems reviewed are negative.          Vital Signs last 24 hours:  Temp:  [36.6 ??C (97.8 ??F)-36.8 ??C (98.2 ??F)] 36.6 ??C (97.8 ??F)  Heart Rate:  [80-99] 80  SpO2 Pulse:  [76-85] 80  Resp:  [16-18] 18  BP: (97-169)/(71-118) 144/71  MAP (mmHg):  [80-130] 93  SpO2:  [88 %-100 %] 99 %    Physical Exam:  Patient Lines/Drains/Airways Status     Active Active Lines, Drains, & Airways     Name Placement date Placement time Site Days    PICC Double Lumen 11/09/20 Right Basilic 11/09/20  1204  Basilic  9    PICC Double Lumen 11/18/20 11/18/20  0221  --  less than 1              Const [x]  vital signs above    []  NAD, non-toxic appearance        Eyes [x]  Lids normal bilaterally, conjunctiva anicteric and noninjected OU     [x] PERRL  [] EOMI        ENMT [x]  Normal appearance of external nose and ears, no nasal discharge        []  MMM, no lesions on lips or gums [x]  No thrush, leukoplakia, oral lesions  [x]  Dentition good []  Edentulous []  Dental caries present  []  Hearing normal  []  TMs with good light reflexes bilaterally         Neck [x]  Neck of normal appearance and trachea midline        []  No thyromegaly, nodules, or tenderness   []  Full neck ROM        Lymph [x]  No LAD in neck     [x]  No LAD in supraclavicular area     []  No LAD in axillae   []  No LAD in epitrochlear chains     []  No LAD in inguinal areas        CV [x]  RRR            [x]  No peripheral edema     []  Pedal pulses intact   [x]  No abnormal heart sounds appreciated   []  Extremities WWP         Resp [x]  Normal WOB at rest    []  No breathlessness with speaking, no coughing  []  CTA anteriorly    []  CTA bilaterally Right Lower to mid zones decreased breath sounds      GI [x]  Normal inspection, NTND   []  NABS     []  No umbilical hernia on exam       [x]  No hepatosplenomegaly     []  Inspection of perineal and perianal areas normal        GU []  Normal external genitalia     [] No urinary catheter present in urethra   []  No CVA tenderness    []  No tenderness over renal allograft  Deferred      MSK []  No clubbing or cyanosis of hands       [x]  No vertebral point tenderness  [x]  No focal tenderness or abnormalities on palpation of joints in RUE, LUE, RLE, or LLE        Skin [x]  No rashes, lesions, or ulcers of visualized skin     []  Skin warm and dry to palpation         Neuro [x]  Face expression symmetric  []  Sensation to light touch grossly intact throughout    [x]  Moves extremities equally    [x]   No tremor noted        []  CNs II-XII grossly intact     []  DTRs normal and symmetric throughout []  Gait unremarkable        Psych [x]  Appropriate affect       [x]  Fluent speech         [x]  Attentive, good eye contact  [x]  Oriented to person, place, time          []  Judgment and insight are appropriate               Data for Medical Decision Making     Recent Labs   Lab Units 11/18/20  0054 11/17/20  1230   WBC 10*9/L 15.7* 15.8*   HEMOGLOBIN g/dL 16.1* 09.6*   PLATELET COUNT (1) 10*9/L 368 380   NEUTRO ABS 10*9/L 11.6* 13.7*   LYMPHO ABS 10*9/L 2.8 1.3   EOSINO ABS 10*9/L 0.1 0.1   BUN mg/dL 17 18   CREATININE mg/dL 0.45* 4.09*   AST U/L 21 24   ALT U/L 40 41   BILIRUBIN TOTAL mg/dL 0.2* 0.2*   ALK PHOS U/L 128* 150*   POTASSIUM mmol/L 4.4 4.4   CALCIUM mg/dL 9.8 9.6   CK TOTAL U/L  --  33.0*       New Culture Data   None    Recent Studies   CT Chest : 8/17  New 5.5 summary longest diameter lobular masslike opacity in the lateral segment of the right middle lobe which could be postinfectious/inflammatory in etiology.  *New tubular and nodular opacities in the right upper and right middle lobes favored to represent mucus within dilated bronchi.  *New small laterally loculated right pleural effusion with no CT signs of empyema. If there is clinical concern for infection, consider fluid sampling via ultrasound-guided thoracentesis.  *Follow-up noncontrast CT chest is recommended 4-6 weeks after treatment to document resolution of the above findings.  ??      Serologies:  Lab Results   Component Value Date    Hep B Surface Ag Nonreactive 01/21/2020    Hep B S Ab Nonreactive 01/21/2020    Hep B Surf Ab Quant <8.00 01/21/2020    Hepatitis C Ab Nonreactive 01/21/2020    HSV 1 IgG Positive (A) 01/21/2020    HSV 2 IgG Negative 01/21/2020    Toxoplasma Gondii IgG Negative 02/11/2020       Immunizations:  Immunization History   Administered Date(s) Administered   ??? COVID-19 VACCINE,MRNA(MODERNA)(PF)(IM) 02/25/2020, 03/23/2020

## 2020-11-18 NOTE — Unmapped (Signed)
[facility] Specialty Medication Onboarding    Specialty Medication: SIVEXTRO 200 mg Tab (tedizolid)  Prior Authorization: Approved   Financial Assistance: No - copay  <$25  Final Copay/Day Supply: $4 / 30    Insurance Restrictions: None     Notes to Pharmacist: n/a    The triage team has completed the benefits investigation and has determined that the patient is able to fill this medication at [facility]. Please contact the patient to complete the onboarding or follow up with the prescribing physician as needed.

## 2020-11-19 LAB — HEPATIC FUNCTION PANEL
ALBUMIN: 3.2 g/dL — ABNORMAL LOW (ref 3.4–5.0)
ALKALINE PHOSPHATASE: 112 U/L (ref 46–116)
ALT (SGPT): 28 U/L (ref 10–49)
AST (SGOT): 14 U/L (ref <=34)
BILIRUBIN DIRECT: <0.1 mg/dL (ref 0.00–0.30)
BILIRUBIN TOTAL: 0.2 mg/dL — ABNORMAL LOW (ref 0.3–1.2)
PROTEIN TOTAL: 6.8 g/dL (ref 5.7–8.2)

## 2020-11-19 LAB — CBC W/ AUTO DIFF
BASOPHILS ABSOLUTE COUNT: 0.1 10*9/L (ref 0.0–0.1)
BASOPHILS RELATIVE PERCENT: 1.1 %
EOSINOPHILS ABSOLUTE COUNT: 0.1 10*9/L (ref 0.0–0.5)
EOSINOPHILS RELATIVE PERCENT: 0.7 %
HEMATOCRIT: 33 % — ABNORMAL LOW (ref 39.0–48.0)
HEMOGLOBIN: 11.2 g/dL — ABNORMAL LOW (ref 12.9–16.5)
LYMPHOCYTES ABSOLUTE COUNT: 2.2 10*9/L (ref 1.1–3.6)
LYMPHOCYTES RELATIVE PERCENT: 17.4 %
MEAN CORPUSCULAR HEMOGLOBIN CONC: 33.9 g/dL (ref 32.0–36.0)
MEAN CORPUSCULAR HEMOGLOBIN: 30.9 pg (ref 25.9–32.4)
MEAN CORPUSCULAR VOLUME: 91.3 fL (ref 77.6–95.7)
MEAN PLATELET VOLUME: 8.5 fL (ref 6.8–10.7)
MONOCYTES ABSOLUTE COUNT: 0.8 10*9/L (ref 0.3–0.8)
MONOCYTES RELATIVE PERCENT: 6.6 %
NEUTROPHILS ABSOLUTE COUNT: 9.2 10*9/L — ABNORMAL HIGH (ref 1.8–7.8)
NEUTROPHILS RELATIVE PERCENT: 74.2 %
PLATELET COUNT: 360 10*9/L (ref 150–450)
RED BLOOD CELL COUNT: 3.61 10*12/L — ABNORMAL LOW (ref 4.26–5.60)
RED CELL DISTRIBUTION WIDTH: 15.6 % — ABNORMAL HIGH (ref 12.2–15.2)
WBC ADJUSTED: 12.4 10*9/L — ABNORMAL HIGH (ref 3.6–11.2)

## 2020-11-19 LAB — BASIC METABOLIC PANEL
ANION GAP: 5 mmol/L (ref 5–14)
BLOOD UREA NITROGEN: 14 mg/dL (ref 9–23)
BUN / CREAT RATIO: 9
CALCIUM: 9 mg/dL (ref 8.7–10.4)
CHLORIDE: 107 mmol/L (ref 98–107)
CO2: 22 mmol/L (ref 20.0–31.0)
CREATININE: 1.6 mg/dL — ABNORMAL HIGH
EGFR CKD-EPI (2021) MALE: 55 mL/min/{1.73_m2} — ABNORMAL LOW (ref >=60)
GLUCOSE RANDOM: 90 mg/dL (ref 70–179)
POTASSIUM: 3.9 mmol/L (ref 3.4–4.8)
SODIUM: 134 mmol/L — ABNORMAL LOW (ref 135–145)

## 2020-11-19 LAB — PHOSPHORUS: PHOSPHORUS: 3.9 mg/dL (ref 2.4–5.1)

## 2020-11-19 LAB — MAGNESIUM: MAGNESIUM: 1.9 mg/dL (ref 1.6–2.6)

## 2020-11-19 MED ADMIN — gabapentin (NEURONTIN) capsule 300 mg: 300 mg | ORAL | @ 13:00:00

## 2020-11-19 MED ADMIN — carvediloL (COREG) tablet 12.5 mg: 12.5 mg | ORAL | @ 01:00:00

## 2020-11-19 MED ADMIN — amLODIPine (NORVASC) tablet 10 mg: 10 mg | ORAL | @ 13:00:00

## 2020-11-19 MED ADMIN — gabapentin (NEURONTIN) capsule 300 mg: 300 mg | ORAL | @ 18:00:00

## 2020-11-19 MED ADMIN — linezolid (ZYVOX) tablet 600 mg: 600 mg | ORAL | @ 01:00:00 | Stop: 2020-12-18

## 2020-11-19 MED ADMIN — oxyCODONE (OxyCONTIN) 12 hr crush resistant ER/CR tablet 20 mg: 20 mg | ORAL | @ 03:00:00 | Stop: 2020-11-18

## 2020-11-19 MED ADMIN — PONATinib (ICLUSIG) tablet 30 mg **PATIENT SUPPLIED**: 30 mg | ORAL | @ 01:00:00

## 2020-11-19 MED ADMIN — gabapentin (NEURONTIN) capsule 300 mg: 300 mg | ORAL | @ 01:00:00

## 2020-11-19 MED ADMIN — carvediloL (COREG) tablet 12.5 mg: 12.5 mg | ORAL | @ 13:00:00

## 2020-11-19 MED ADMIN — sodium chloride (NS) 0.9 % flush 10 mL: 10 mL | INTRAVENOUS | @ 13:00:00

## 2020-11-19 MED ADMIN — nortriptyline (PAMELOR) capsule 75 mg: 75 mg | ORAL | @ 01:00:00

## 2020-11-19 MED ADMIN — linezolid (ZYVOX) tablet 600 mg: 600 mg | ORAL | @ 13:00:00 | Stop: 2020-12-18

## 2020-11-19 MED ADMIN — oxyCODONE (ROXICODONE) immediate release tablet 10 mg: 10 mg | ORAL | @ 13:00:00 | Stop: 2020-12-03

## 2020-11-19 MED ADMIN — clonazePAM (KlonoPIN) tablet 0.5 mg: .5 mg | ORAL | @ 03:00:00

## 2020-11-19 MED ADMIN — oxyCODONE (OxyCONTIN) 12 hr crush resistant ER/CR tablet 10 mg: 10 mg | ORAL | @ 14:00:00 | Stop: 2020-12-03

## 2020-11-19 MED ADMIN — valACYclovir (VALTREX) tablet 500 mg: 500 mg | ORAL | @ 13:00:00

## 2020-11-19 MED ADMIN — aspirin chewable tablet 81 mg: 81 mg | ORAL | @ 13:00:00

## 2020-11-19 MED ADMIN — alteplase (ACTIVASE) injection small catheter clearance 2 mg: 2 mg | INTRAVENOUS | @ 20:00:00 | Stop: 2020-11-20

## 2020-11-19 MED ADMIN — sodium chloride (NS) 0.9 % flush 10 mL: 10 mL | INTRAVENOUS | @ 01:00:00

## 2020-11-19 MED ADMIN — predniSONE (DELTASONE) tablet 20 mg: 20 mg | ORAL | @ 13:00:00

## 2020-11-19 MED ADMIN — oxyCODONE (ROXICODONE) immediate release tablet 10 mg: 10 mg | ORAL | @ 18:00:00 | Stop: 2020-12-03

## 2020-11-19 MED ADMIN — isavuconazonium sulfate (CRESEMBA) capsule 372 mg: 372 mg | ORAL | @ 13:00:00

## 2020-11-19 NOTE — Unmapped (Signed)
 IMMUNOCOMPROMISED HOST INFECTIOUS DISEASE PROGRESS NOTE    Assessment/Plan:     Adam 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 Jerrye  - 4/21 BMBx normocellular bone marrow 50% w/ 1% blasts (MRD)  - Extent of disease/CNS involvement: rare blast on prior CSF, intrathecal ppx (cytarabine , methotrexate , hydrocortisone ) last 09/18/2020  - Cancer-related complications: TLS, hyperbilirubinemia, MRSA bacteremia w/ septic emboli/renal failure+dialysis/intubation, C. krusei fungemia  - Prior chemotherapy: GRAAPPH-2005 induction with dasatinib  (vincristine, dexamethasone  and dasatinib ); C1D1 01/24/2020; difficulty tolerating single-agent dasatinib   - Current chemotherapy: blinatumomab  (anti-CD19/CD3), C1D1 = 3/18 (symtomatic, titrated up dose and restarted 3/21) + ponatinib  (TKI)  - currently cycle 3/5  - Right IJ Port-A-Cath placed 04/15/2020  - Infection prophylaxis prior to admission: Bactrim  1 DS MWF, valacyclovir , s/p Evusheld 04/21/2020 x2 doses, isavuconazole  - on pred 20mg  daily PTA as below     Likely DIHS/DRESS, 06/12/2020 and new rash 8/01  - followed by Dr. Brinda, dermatology  - 3/11 punch bx  - per prior ID notes possible culprits ceftaroline, posaconazole , dasatinib , sotrovimab  - received high dose steroids in 06/2020, 3/3 - 3/7 pred 50 x5d; 06/19/20 started at 50 mg prednisone  taper  - flare on pred 10 mg daily  - pink rash noted on chest on 8/01, with rising eosinophils. 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     Infection History  Active infections:  #MRSA bacteremia, 11/01/2020  #RLL pulmonary consolidation on 11/01/2020 CXR, worsening effusion/consolidation+/-empyema 11/18/2020, CT 8/17 - new nodules  - 7/31 BCx 1/2 Staph aureus  - 7/31 LRCx Staph aureus  - 8/01 TTE without evidence of endocarditis  - S/p Port-a-cath removal on 8/03  - Blood Culture negative since 8/03  - Rx: 7/31 linezolid /cefepime  --> linezolid /daptomycin /cefepime  -->8/02 linezolid /daptomycin --->Discharged home on 8/03 Daptomycin  -->8/12 Restarted Linezolid /Daptomycin   -Represented to the hospital 8/17 due to worsening of R pleuritic chest pain  -8/17--Linezolid  ( Daptomycin  ceased)     Prior infections     #Non-neutropenic fever and new pulmonary nodules/infiltrates, 07/17/20  - 4/15 BC x2 NGTD  - 4/16 CT Chest: multifocal somewhat nodular consolidative opacities in b/l lungs that are relatively new, most concerning for infectious PNA (possibly fungal, especially given his recent high dose steroid taper)  - 4/17 LRCx +2 MRSA  - 4/17 Legionella uAg, serum crypto Ag, serum Asp galactomannan, urine histo ag, B-D-glucan (Fungitell), serum CMV neg  - 4/18 bronch with AJO:ajru cx + MRSA, OP flora, RPP negative, Legionella cx, AFB cx, fungal cx, CMV PCR, PJP DFA, Asp galactomannan neg  - 7/28 CT chest resolved  Rx:  4/15 cefepime /dapto --> 4/16 cefepime  --> 4/18 cefepime /isavu/linezolid  -> 4/19 linezolid  --> 4/21 isavuconazole/linezolid  --> 5/10 isavuconazole -->  -8/17 : continues on Isavuconazole ( due to DDI with Ponatinib )     #Intertrigo in groin, 11/01/2020     #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 Placebo  Tx: 10/25 vanc/cefepime  --> 10/27 dapto/ceftaroline --> 11/5 ceftaroline/vanc  -> 11/16 vanc (stopped due to ototoxicity though other meds were more likely) -->Ceftaroline (w/ 1 dose DAP) 11/28 ->12/02-->Vanc 12/02 - ?        Antimicrobial Intolerance/allergy  Cefepime  - rash with concern for DRESS flare  Ceftaroline - DRESS  Dapsone - possible agranulocytosis, per chart anaphylaxis  Vancomycin  - probable ototoxicity (in combination with furosemide )  Isavuconazole - elevated LFTs in the setting of TKI             RECOMMENDATIONS    Diagnostic     Recommend bronchoscopy with BAL     Lab monitoring for antimicrobial toxicities  Monitor FBC/ eLFT's while on antimicrobials     Treatment     #MRSA Pneumonia/complicated by effusion/empyema  CONT PO Linezolid  600mg  BD            The [facility] ID service will continue to follow.  Please page the ID Transplant/Liquid Oncology Fellow consult at [phone] with questions.  Patient discussed with Dr. Alm Salinas Duin    Immunocompromised Host ID Attending Addendum  I saw and evaluated the patient. I discussed and agree with the findings and the plan of care as documented in the fellow???s note.The patient is at risk of decompensation from infection due to underlying immunocompromise and/or mucosal barrier defects and/or presence of medical devices.   I personally reviewed updated microbiological culture and susceptibility data.  I personally reviewed updated relevant radiological studies.    Alm salinas Duin  Immunocompromised Host Infectious Diseases  Pager [phone]      Subjective:     Interval History:    History obtained from:patient.    Well, no new symptoms  Right pleuritic pain persists  afebrile    Medications:  Antimicrobials:  Anti-infectives (From admission, onward)      Start     Dose/Rate Route Frequency Ordered Stop    11/21/20 0900  sulfamethoxazole -trimethoprim  (BACTRIM  DS) 800-160 mg tablet 160 mg of trimethoprim  1 tablet Oral 2 times a day on saturday, sunday 11/18/20 1034 12/26/20 0859    11/18/20 1347  isavuconazonium sulfate (CRESEMBA) capsule 372 mg         372 mg Oral Daily (standard) 11/18/20 1034      11/18/20 1346  linezolid (ZYVOX) tablet 600 mg         600 mg Oral 2 times a day (standard) 11/18/20 1034 12/18/20 0859    11/18/20 1343  valACYclovir (VALTREX) tablet 500 mg         50 0 mg Oral Daily (standard) 11/18/20 1034              Prior/Current immunomodulators:  Blinatumomab , Ponatinib     Other medications reviewed.    Objective:     Vital Signs last 24 hours:  Temp:  [36.4 ??C (97.5 ??F)-37.1 ??C (98.8 ??F)] 36.4 ??C (97.5 ??F)  Heart Rate:  [72-85] 81  Resp:  [18-20] 18  BP: (130-142)/(85-93) 133/91  MAP (mmHg):  [100-107] 104  SpO2:  [94 %-98 %] 94 %    Physical Exam:  Patient Lines/Drains/Airways Status       Active Active Lines, Drains, & Airways  Name Placement date Placement time Site Days    PICC Double Lumen 11/09/20 Right Basilic 11/09/20  1204  Basilic  10                  Const [x]  vital signs above    []  NAD, non-toxic appearance        Eyes [x]  Lids normal bilaterally, conjunctiva anicteric and noninjected OU     [] PERRL  [] EOMI        ENMT [x]  Normal appearance of external nose and ears, no nasal discharge        []  MMM, no lesions on lips or gums [x]  No thrush, leukoplakia, oral lesions  [x]  Dentition good []  Edentulous []  Dental caries present  []  Hearing normal  []  TMs with good light reflexes bilaterally         Neck [x]  Neck of normal appearance and trachea midline        []  No thyromegaly, nodules, or tenderness   []  Full neck ROM        Lymph [x]  No LAD in neck     []  No LAD in supraclavicular area     []  No LAD in axillae   []  No LAD in epitrochlear chains     []  No LAD in inguinal areas        CV [x]  RRR            []  No peripheral edema     []  Pedal pulses intact   [x]  No abnormal heart sounds appreciated   []  Extremities WWP         Resp [x]  Normal WOB at rest    []  No breathlessness with speaking, no coughing  []  CTA anteriorly    []  CTA bilaterally    Dec Breath sounds Right side      GI [x]  Normal inspection, NTND   [x]  NABS     []  No umbilical hernia on exam       []  No hepatosplenomegaly     []  Inspection of perineal and perianal areas normal        GU []  Normal external genitalia     [] No urinary catheter present in urethra   []  No CVA tenderness    []  No tenderness over renal allograft  Deferred      MSK []  No clubbing or cyanosis of hands       []  No vertebral point tenderness  [x]  No focal tenderness or abnormalities on palpation of joints in RUE, LUE, RLE, or LLE        Skin [x]  No rashes, lesions, or ulcers of visualized skin     []  Skin warm and dry to palpation         Neuro [x]  Face expression symmetric  []  Sensation to light touch grossly intact throughout    [x]  Moves extremities equally    [x]  No tremor noted        []  CNs II-XII grossly intact     []  DTRs normal and symmetric throughout []  Gait unremarkable        Psych [x]  Appropriate affect       [x]  Fluent speech         [x]  Attentive, good eye contact  [x]  Oriented to person, place, time          []  Judgment and insight are appropriate             Data for Medical Decision Making  Recent Labs   Lab Units 11/19/20  0607 11/18/20  0054 11/17/20  1230   WBC 10*9/L 12.4* 15.7* 15.8*   HEMOGLOBIN g/dL 88.7* 87.9* 87.7*   PLATELET COUNT (1) 10*9/L 360 368 380   NEUTRO ABS 10*9/L 9.2* 11.6* 13.7*   LYMPHO ABS 10*9/L 2.2 2.8 1.3   EOSINO ABS 10*9/L 0.1 0.1 0.1   BUN mg/dL 14 17 18    CREATININE mg/dL 8.39* 8.25* 8.12*   AST U/L 14 21 24    ALT U/L 28 40 41   BILIRUBIN TOTAL mg/dL 0.2* 0.2* 0.2*   ALK PHOS U/L 112 128* 150*   POTASSIUM mmol/L 3.9 4.4 4.4   MAGNESIUM  mg/dL 1.9  --   --    PHOSPHORUS mg/dL 3.9  --   --    CALCIUM mg/dL 9.0 9.8 9.6   CK TOTAL U/L  --   --  33.0*         New Micro Data  none    Recent Studies  none

## 2020-11-19 NOTE — Unmapped (Signed)
[facility] Liaison - Referred to Venous Access Team Note      [facility] Liaison was consulted for issues with PICC. Referred to Venous Access Team.     Thank you for this consult,  Shelva Majestic Cait Locust RN    Consult Time  15 minutes (min)

## 2020-11-19 NOTE — Unmapped (Signed)
Scheduled Oxycontin as ordered and PRN oxycodone given x2 thus far. Up in room and off unit with steady gait. Wife at [facility]. ABTs as ordered. Monitoring   Problem: Adult Inpatient Plan of Care  Goal: Plan of Care Review  Outcome: Ongoing - Unchanged  Goal: Patient-Specific Goal (Individualized)  Outcome: Ongoing - Unchanged  Goal: Absence of Hospital-Acquired Illness or Injury  Outcome: Ongoing - Unchanged  Intervention: Identify and Manage Fall Risk  Recent Flowsheet Documentation  Taken 11/19/2020 0704 by Silvano Bilis, RN  Safety Interventions:   family at bedside   low bed   nonskid shoes/slippers when out of bed  Intervention: Prevent and Manage VTE (Venous Thromboembolism) Risk  Recent Flowsheet Documentation  Taken 11/19/2020 1145 by Silvano Bilis, RN  Activity Management: ambulated outside room  Taken 11/19/2020 1000 by Silvano Bilis, RN  Activity Management: sitting, edge of bed  Goal: Optimal Comfort and Wellbeing  Outcome: Progressing  Goal: Readiness for Transition of Care  Outcome: Progressing  Goal: Rounds/Family Conference  Outcome: Progressing     Problem: Infection  Goal: Absence of Infection Signs and Symptoms  Outcome: Ongoing - Unchanged     Problem: Self-Care Deficit  Goal: Improved Ability to Complete Activities of Daily Living  Outcome: Progressing     Problem: COPD (Chronic Obstructive Pulmonary Disease) Comorbidity  Goal: Maintenance of COPD Symptom Control  Outcome: Progressing     Problem: Hypertension Comorbidity  Goal: Blood Pressure in Desired Range  Outcome: Progressing     Problem: Impaired Wound Healing  Goal: Optimal Wound Healing  Outcome: Resolved  Intervention: Promote Wound Healing  Recent Flowsheet Documentation  Taken 11/19/2020 1145 by Silvano Bilis, RN  Activity Management: ambulated outside room  Taken 11/19/2020 1000 by Silvano Bilis, RN  Activity Management: sitting, edge of bed

## 2020-11-19 NOTE — Unmapped (Signed)
Heme/Onc APP Daily Progress Note        Active Problems:    * No active hospital problems. *       LOS: 1 day     Assessment/Plan: Adam Keith is an 40 y.o. 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     Plan Summary: IP following, continue discussions regarding diagnostic thoracentesis and bronchoscopy. CT Chest shows loculated pleural effusion and worsening nodules. Per ICID recs, stopped Daptomycin. Continue Linezolid. Tidizolid outpatient script sent and approved. F/u 8/18 BCx - ordered. Further supportive care and chronic meds as noted below.     Pleuritic chest pain, concerning for empyema: Febrile on 8/12 with right side pleuritic chest pain and started on Linezolid. CXR completed on 8/16 concerning for empyema. CT Chest shows loculated pleural effusion. IP consulted upon admission for evaluation and consideration of diagnostic thoracentesis. CT Chest also shows worsening nodules and will also discuss bronchoscopy with IP as well. Appreciate ICID and IP help.   [ ]  ICID following, f/u recs   [ ]  Interventional pulm following, f/u recs   - Continue on ABX as below   ??  MRSA PNA/bacteremia: Complex history with multiple episodes of MRSA PNA/bacteremia requiring intubation, most recent admission on 7/31. Discharged with Daptomycin course with plan to complete course on 8/17, however given new symptoms will continue Daptomycin on admission. Followed by ICID with Dr. [name] in clinic. Per ICID recs, stopped Daptomycin on 8/17. Requested new BCx on 8/18.   - Linezolid   [ ]  F/u 8/18 BCx - ordered   ??  Ph+ ALL: Followed by [name]. Initially diagnosed in 01/2020. Received GRAAPH induction. Induction course was c/b septic shock, candida krusei fungemia, MRSA bacteremia and septic emobli c/b acute renal failure and respiratory distress requriing dialysis and intubation. Post-induction BMBx demonstrated flow-based MRD-negative remission but low-level BCR-ABL persisted. He is s/p cycle 3 Blinatumomab and Ponatinib c/b MRSA pneumonia. Plan is for one IT treatment per cycle. Due for Cycle 4, however on HOLD until after patient's family vacation.   - Ponatinib nightly   - ASA   ??  CKD:??Arising from AKI and acute renal failure due to sepsis. Now s/p dialysis. His creatnine remains stable but appears to have developed CKD d/t incompletely healed AKI. Cr upon admission is stable at 2.15. Per chart review, his new baseline over the past few months is ~1.9 - 2.2.   ??  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 Patient reports hypersensitivity of left hearing and difficulty with complex sounds and multiple conversations  - AVOID Lasix/loop diuretics, other ototoxic medications  ??  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 Followed by ICID as an outpatient.   - Isavoriconazole ppx daily as noted above   ??  Hx DRESS: ??Followed by Dr. Caryn Section in Dermatology. Takes Prednisone 20 mg daily.   - Prednisone 20 mg daily   ??  Anxiety/Depression: Chronic condition.??Followed by [facility] and sees local provider for ongoing Psychotherapy.   - Nortriptyline 50 mg nightly??  - Klonopin 1 mg PO BID??PRN   ??  HTN: Chronic condition since diagnosis. Home regimen includes Amlodipine and Coreg.   - Coreg   - Amlodipine   ??  Chronic pain: Takes Oxycontin 15 mg BID and Gabapentin 300 mg TID. Followed by Palliative Care.    - Oxycontin BID   -  Gabapentin 300 mg TID   ??  Cancer related fatigue:????Patient endorses fatigue with onset of cancer symptoms or treatment.????  ??  Immunocompromised status: Patient is immunocompromised secondary to chemotherapy.   - Antimicrobial prophylaxis as above  ??  Impending Electrolyte Abnormality Secondary to Chemotherapy and/or IV Fluids  - Daily Electrolyte monitoring  - Replete per [facility] guidelines.??  ??    Nutrition:                        Subjective:   Afebrile. NAEON. Having some pain on his right side controlled with pain medication. Otherwise no complaints.     10 point ROS otherwise negative except as above in the HPI.     Objective:     Vital signs in last 24 hours:  Temp:  [36.5 ??C (97.7 ??F)-37.1 ??C (98.8 ??F)] 36.7 ??C (98.1 ??F)  Heart Rate:  [72-91] 72  SpO2 Pulse:  [80] 80  Resp:  [18] 18  BP: (130-165)/(71-102) 142/92  MAP (mmHg):  [93-107] 107  SpO2:  [96 %-99 %] 96 %  BMI (Calculated):  [31.04] 31.04    Intake/Output last 3 shifts:  No intake/output data recorded.    Meds:  Current Facility-Administered Medications   Medication Dose Route Frequency Provider Last Rate Last Admin   ??? amLODIPine (NORVASC) tablet 10 mg  10 mg Oral Daily Maryanna Shape, [place]   10 mg at 11/18/20 1456   ??? aspirin chewable tablet 81 mg  81 mg Oral Daily Maryanna Shape, [place]   81 mg at 11/18/20 1455   ??? carvediloL (COREG) tablet 12.5 mg  12.5 mg Oral BID Maryanna Shape, PA   12.5 mg at 11/18/20 2048   ??? clonazePAM (KlonoPIN) tablet 0.5 mg  0.5 mg Oral BID PRN Maryanna Shape, PA   0.5 mg at 11/18/20 2254   ??? emollient combination no.92 (LUBRIDERM) lotion 1 application  1 application Topical Q1H PRN Maryanna Shape, PA       ??? gabapentin (NEURONTIN) capsule 300 mg  300 mg Oral TID Maryanna Shape, PA   300 mg at 11/18/20 2048   ??? hydrOXYzine (ATARAX) tablet 25 mg  25 mg Oral Q6H PRN Maryanna Shape, PA       ??? IP OKAY TO TREAT   Other Continuous PRN Maryanna Shape, PA       ??? isavuconazonium sulfate (CRESEMBA) capsule 372 mg  372 mg Oral Daily Maryanna Shape, [place]   372 mg at 11/18/20 1510   ??? linezolid (ZYVOX) tablet 600 mg  600 mg Oral BID Maryanna Shape, PA   600 mg at 11/18/20 2048   ??? loperamide (IMODIUM) capsule 2 mg  2 mg Oral Q2H PRN Maryanna Shape, PA       ??? loperamide (IMODIUM) capsule 4 mg  4 mg Oral Once PRN Maryanna Shape, PA       ??? nortriptyline (PAMELOR) capsule 75 mg  75 mg Oral Nightly Maryanna Shape, [place]   75 mg at 11/18/20 2048   ??? oxyCODONE (OxyCONTIN) 12 hr crush resistant ER/CR tablet 10 mg  10 mg Oral Q12H SCH [employer] Kindt Moreland Japneet Staggs, [place]       ??? oxyCODONE (ROXICODONE) immediate release tablet 5 mg  5 mg Oral Q4H PRN Court Joy Lyndon Chenoweth, PA        Or   ??? oxyCODONE (ROXICODONE) immediate release tablet 10 mg  10 mg Oral Q4H PRN  Court Joy Rylynn Kobs, PA       ??? PONATinib (ICLUSIG) tablet 30 mg **PATIENT SUPPLIED**  30 mg Oral Q24H Guerry Bruin, MD   30 mg at 11/18/20 2049   ??? potassium chloride (KLOR-CON) CR tablet 20 mEq  20 mEq Oral Q6H PRN Maryanna Shape, PA       ??? potassium chloride (KLOR-CON) CR tablet 40 mEq  40 mEq Oral Q6H PRN Maryanna Shape, PA       ??? potassium chloride (KLOR-CON) CR tablet 60 mEq  60 mEq Oral Q6H PRN Maryanna Shape, PA       ??? predniSONE (DELTASONE) tablet 20 mg  20 mg Oral Daily Maryanna Shape, [place]   20 mg at 11/18/20 1457   ??? sodium chloride (NS) 0.9 % flush 10 mL  10 mL Intravenous BID Maryanna Shape, [place]       ??? sodium chloride (NS) 0.9 % flush 10 mL  10 mL Intravenous BID Maryanna Shape, [place]   10 mL at 11/18/20 2047   ??? sodium chloride (NS) 0.9 % flush 10 mL  10 mL Intravenous BID Maryanna Shape, [place]   10 mL at 11/18/20 2047   ??? sodium chloride (NS) 0.9 % infusion  20 mL/hr Intravenous Continuous Maryanna Shape, [place]       ??? [START ON 11/21/2020] sulfamethoxazole-trimethoprim (BACTRIM DS) 800-160 mg tablet 160 mg of trimethoprim  1 tablet Oral BID Maryanna Shape, PA       ??? valACYclovir (VALTREX) tablet 500 mg  500 mg Oral Daily Maryanna Shape, PA   500 mg at 11/18/20 1457       Physical Exam:  General: Resting, in no apparent distress, lying in bed  HEENT:  PER. No scleral icterus or conjunctival injection. MMM   Heart:  RRR. S1, S2. No murmurs, gallops, or rubs.  Lungs:  Breathing is unlabored, and patient is speaking full sentences with ease. Diffuse wheezing, Diminished LS in RLL.   Abdomen:  Nontender, nondistended. BS normoactive x 4. No palpable masses.  Skin:  No rashes, petechiae or purpura.  No areas of skin breakdown. Warm to touch, dry, smooth, and even.  Musculoskeletal:  No grossly-evident joint effusions or deformities.  Range of motion about the shoulder, elbow, hips and knees is grossly normal.   Psychiatric:  Range of affect is appropriate.    Neurologic:  Alert and oriented to person, place, time and situation. CNII-CNXII grossly intact.  Extremities:  Appear well-perfused. No clubbing, edema, or cyanosis.  CVAD: R Arm PICC - no erythema, nontender; dressing CDI.      Labs:  Recent Labs     11/17/20  1230 11/18/20  0054 11/19/20  0607   WBC 15.8* 15.7* 12.4*   NEUTROABS 13.7* 11.6* 9.2*   LYMPHSABS 1.3 2.8 2.2   HGB 12.2* 12.0* 11.2*   HCT 36.4* 35.2* 33.0*   PLT 380 368 360   CREATININE 1.87* 1.74*  --    BUN 18 17  --    BILITOT 0.2* 0.2* 0.2*   BILIDIR  --   --  <0.10   AST 24 21 14    ALT 41 40 28   ALKPHOS 150* 128* 112   K 4.4 4.4  --    MG  --   --  1.9   CALCIUM 9.6 9.8  --    NA 135 137  --    CL 104 108*  --  CO2 20.0 23.0  --    PHOS  --   --  3.9   CKTOTAL 33.0*  --   --        Imaging:  No new.    Dionne Ano Lakashia Collison, PA-C  11/19/2020  8:15 AM   Physician Assistant   Hematology/Oncology Department   [facility]   Group pager: [phone]

## 2020-11-19 NOTE — Unmapped (Signed)
Pt new admit to the floor yesterday. Pt w/ no acute events overnight. Pt has R double PICC. Pt received scheduled Oycontin for pain and PRN Klonopin for anxiety. Pt remains on contact for MRSA. Pt wife at bedside. WCTM.    Problem: Adult Inpatient Plan of Care  Goal: Plan of Care Review  Outcome: Ongoing - Unchanged  Goal: Patient-Specific Goal (Individualized)  Outcome: Ongoing - Unchanged  Goal: Absence of Hospital-Acquired Illness or Injury  Outcome: Ongoing - Unchanged  Intervention: Identify and Manage Fall Risk  Recent Flowsheet Documentation  Taken 11/18/2020 2000 by Lyndee Leo, RN  Safety Interventions:   fall reduction program maintained   family at bedside   low bed   lighting adjusted for tasks/safety   nonskid shoes/slippers when out of bed  Intervention: Prevent and Manage VTE (Venous Thromboembolism) Risk  Recent Flowsheet Documentation  Taken 11/18/2020 2000 by Lyndee Leo, RN  Activity Management: activity adjusted per tolerance  Goal: Optimal Comfort and Wellbeing  Outcome: Ongoing - Unchanged  Goal: Readiness for Transition of Care  Outcome: Ongoing - Unchanged  Goal: Rounds/Family Conference  Outcome: Ongoing - Unchanged     Problem: Infection  Goal: Absence of Infection Signs and Symptoms  Outcome: Ongoing - Unchanged  Intervention: Prevent or Manage Infection  Recent Flowsheet Documentation  Taken 11/18/2020 2000 by Lyndee Leo, RN  Isolation Precautions: contact precautions maintained     Problem: COPD (Chronic Obstructive Pulmonary Disease) Comorbidity  Goal: Maintenance of COPD Symptom Control  Outcome: Ongoing - Unchanged

## 2020-11-20 LAB — CBC W/ AUTO DIFF
BASOPHILS ABSOLUTE COUNT: 0.1 10*9/L (ref 0.0–0.1)
BASOPHILS RELATIVE PERCENT: 0.5 %
EOSINOPHILS ABSOLUTE COUNT: 0.2 10*9/L (ref 0.0–0.5)
EOSINOPHILS RELATIVE PERCENT: 1.2 %
HEMATOCRIT: 34.1 % — ABNORMAL LOW (ref 39.0–48.0)
HEMOGLOBIN: 11.5 g/dL — ABNORMAL LOW (ref 12.9–16.5)
LYMPHOCYTES ABSOLUTE COUNT: 2.7 10*9/L (ref 1.1–3.6)
LYMPHOCYTES RELATIVE PERCENT: 20.2 %
MEAN CORPUSCULAR HEMOGLOBIN CONC: 33.8 g/dL (ref 32.0–36.0)
MEAN CORPUSCULAR HEMOGLOBIN: 30.7 pg (ref 25.9–32.4)
MEAN CORPUSCULAR VOLUME: 90.9 fL (ref 77.6–95.7)
MEAN PLATELET VOLUME: 8.1 fL (ref 6.8–10.7)
MONOCYTES ABSOLUTE COUNT: 0.9 10*9/L — ABNORMAL HIGH (ref 0.3–0.8)
MONOCYTES RELATIVE PERCENT: 6.4 %
NEUTROPHILS ABSOLUTE COUNT: 9.5 10*9/L — ABNORMAL HIGH (ref 1.8–7.8)
NEUTROPHILS RELATIVE PERCENT: 71.7 %
PLATELET COUNT: 388 10*9/L (ref 150–450)
RED BLOOD CELL COUNT: 3.75 10*12/L — ABNORMAL LOW (ref 4.26–5.60)
RED CELL DISTRIBUTION WIDTH: 16 % — ABNORMAL HIGH (ref 12.2–15.2)
WBC ADJUSTED: 13.3 10*9/L — ABNORMAL HIGH (ref 3.6–11.2)

## 2020-11-20 LAB — BASIC METABOLIC PANEL
ANION GAP: 6 mmol/L (ref 5–14)
BLOOD UREA NITROGEN: 20 mg/dL (ref 9–23)
BUN / CREAT RATIO: 11
CALCIUM: 9.3 mg/dL (ref 8.7–10.4)
CHLORIDE: 105 mmol/L (ref 98–107)
CO2: 26 mmol/L (ref 20.0–31.0)
CREATININE: 1.8 mg/dL — ABNORMAL HIGH
EGFR CKD-EPI (2021) MALE: 48 mL/min/{1.73_m2} — ABNORMAL LOW (ref >=60)
GLUCOSE RANDOM: 86 mg/dL (ref 70–99)
POTASSIUM: 3.7 mmol/L (ref 3.4–4.8)
SODIUM: 137 mmol/L (ref 135–145)

## 2020-11-20 LAB — PHOSPHORUS: PHOSPHORUS: 4.6 mg/dL (ref 2.4–5.1)

## 2020-11-20 LAB — MAGNESIUM: MAGNESIUM: 2.1 mg/dL (ref 1.6–2.6)

## 2020-11-20 MED ADMIN — PONATinib (ICLUSIG) tablet 30 mg **PATIENT SUPPLIED**: 30 mg | ORAL | @ 01:00:00

## 2020-11-20 MED ADMIN — oxyCODONE (OxyCONTIN) 12 hr crush resistant ER/CR tablet 10 mg: 10 mg | ORAL | @ 03:00:00 | Stop: 2020-12-03

## 2020-11-20 MED ADMIN — gabapentin (NEURONTIN) capsule 300 mg: 300 mg | ORAL | @ 16:00:00 | Stop: 2020-11-20

## 2020-11-20 MED ADMIN — carvediloL (COREG) tablet 12.5 mg: 12.5 mg | ORAL | @ 16:00:00 | Stop: 2020-11-20

## 2020-11-20 MED ADMIN — lidocaine (XYLOCAINE) 10 mg/mL (1 %) injection: @ 18:00:00 | Stop: 2020-11-20

## 2020-11-20 MED ADMIN — clonazePAM (KlonoPIN) tablet 0.5 mg: .5 mg | ORAL | @ 03:00:00

## 2020-11-20 MED ADMIN — valACYclovir (VALTREX) tablet 500 mg: 500 mg | ORAL | @ 16:00:00 | Stop: 2020-11-20

## 2020-11-20 MED ADMIN — gabapentin (NEURONTIN) capsule 300 mg: 300 mg | ORAL | @ 01:00:00

## 2020-11-20 MED ADMIN — sodium chloride (NS) 0.9 % flush 10 mL: 10 mL | INTRAVENOUS | @ 01:00:00

## 2020-11-20 MED ADMIN — nortriptyline (PAMELOR) capsule 75 mg: 75 mg | ORAL | @ 01:00:00

## 2020-11-20 MED ADMIN — carvediloL (COREG) tablet 12.5 mg: 12.5 mg | ORAL | @ 01:00:00

## 2020-11-20 MED ADMIN — sodium chloride (NS) 0.9 % flush 10 mL: 10 mL | INTRAVENOUS | @ 16:00:00 | Stop: 2020-11-20

## 2020-11-20 MED ADMIN — oxyCODONE (OxyCONTIN) 12 hr crush resistant ER/CR tablet 10 mg: 10 mg | ORAL | @ 16:00:00 | Stop: 2020-11-20

## 2020-11-20 MED ADMIN — linezolid (ZYVOX) tablet 600 mg: 600 mg | ORAL | @ 01:00:00 | Stop: 2020-12-18

## 2020-11-20 MED ADMIN — isavuconazonium sulfate (CRESEMBA) capsule 372 mg: 372 mg | ORAL | @ 16:00:00 | Stop: 2020-11-20

## 2020-11-20 MED ADMIN — oxyCODONE (ROXICODONE) immediate release tablet 5 mg: 5 mg | ORAL | @ 10:00:00 | Stop: 2020-11-20

## 2020-11-20 MED ADMIN — oxyCODONE (ROXICODONE) immediate release tablet 5 mg: 5 mg | ORAL | @ 01:00:00 | Stop: 2020-12-03

## 2020-11-20 MED ADMIN — linezolid (ZYVOX) tablet 600 mg: 600 mg | ORAL | @ 16:00:00 | Stop: 2020-11-20

## 2020-11-20 MED ADMIN — predniSONE (DELTASONE) tablet 20 mg: 20 mg | ORAL | @ 16:00:00 | Stop: 2020-11-20

## 2020-11-20 NOTE — Unmapped (Signed)
See procedure note in Epic    Hulen Luster, MD  Interventional Pulmonology Fellow  Division of Pulmonary Diseases and Critical Care Medicine  Pager: [phone]

## 2020-11-20 NOTE — Unmapped (Addendum)
Adam Keith is an 40 y.o. 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 CT Scan upon admission showed loculated pleural effusion c/f empyema. He also showed new pulmonary nodules that need evaluation. IP was consulted for diagnostic thoracentesis, which was attempted but unsuccessful. Due to scheduling, a bronchoscopy could be done until next week. CT Scan also to be scheduled as an outpatient. Plan is for bronchoscopy as an outpatient. These are being set up by the IP team. His further detailed hospital course is noted below.     Pleuritic chest pain, concerning for empyema: Febrile on 8/12 with right side pleuritic chest pain and started on Linezolid. CXR completed on 8/16 concerning for empyema. CT Chest shows loculated pleural effusion. IP consulted upon admission for evaluation and consideration of diagnostic thoracentesis. CT Chest also shows worsening nodules and will also discuss bronchoscopy with IP as well. Appreciated ICID and IP help. He underwent thoracentesis on 8/19 which was unsuccessful.     New Pulmonary Nodules: Seen incidentally on CT scan done as noted above. C/f new infection. IP already following and were planning on bronchoscopy and biopsy. However, due to scheduling, this could not be done until next week. Therefore given clinical stability of patient, this will be pursued as an outpatient. IP will be also coordinating outpatient follow up and bronchoscopy.      MRSA PNA/bacteremia: Complex history with multiple episodes of MRSA PNA/bacteremia requiring intubation, most recent admission on 7/31. Discharged with Daptomycin course with plan to complete course on 8/17, however given new symptoms will continue Daptomycin on admission. Followed by ICID with Dr. [name] in clinic. Per ICID recs, stopped Daptomycin on 8/17. Requested new BCx on 8/18.   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 Received GRAAPH induction. Induction course was c/b septic shock, candida krusei fungemia, MRSA bacteremia and septic emobli c/b acute renal failure and respiratory distress requriing dialysis and intubation. Post-induction BMBx demonstrated flow-based MRD-negative remission but low-level BCR-ABL persisted. He is s/p cycle 3 Blinatumomab and Ponatinib c/b MRSA pneumonia. Plan is for one IT treatment per cycle. Due for Cycle 4, however on HOLD until after patient's family vacation.   - Ponatinib nightly   - ASA      CKD: Arising from AKI and acute renal failure due to sepsis. Now s/p dialysis. His creatnine remains stable but appears to have developed CKD d/t incompletely healed AKI. Cr upon admission is stable at 2.15. Per chart review, his new baseline over the past few months is ~1.9 - 2.2.      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 Patient reports hypersensitivity of left hearing and difficulty with complex sounds and multiple conversations  - AVOID Lasix/loop diuretics, other ototoxic medications     History of Candidemia: Recently restarted ppx with Isavoriconazole. Followed by ICID as an outpatient.   - Isavoriconazole ppx daily as noted above      Hx DRESS:  Followed by Dr. Caryn Section in Dermatology. Takes Prednisone 20 mg daily.   - Prednisone 20 mg daily      Anxiety/Depression: Chronic condition. Followed by [facility] and sees local provider for ongoing Psychotherapy.   - Nortriptyline 50 mg nightly   - Klonopin 1 mg PO BID PRN  HTN: Chronic condition since diagnosis. Home regimen includes Amlodipine and Coreg.   - Coreg   - Amlodipine      Chronic pain: Takes Oxycontin 15 mg BID and Gabapentin 300 mg TID. Followed by Palliative Care.    - Oxycontin BID   - Gabapentin 300 mg TID      Cancer related fatigue:  Patient endorses fatigue with onset of cancer symptoms or treatment.       Immunocompromised status: Patient is immunocompromised secondary to chemotherapy.   - Antimicrobial prophylaxis as above     Impending Electrolyte Abnormality Secondary to Chemotherapy and/or IV Fluids  - Daily Electrolyte monitoring  - Replete per [facility] guidelines.

## 2020-11-20 NOTE — Unmapped (Signed)
Brief Operative Note  (CSN: [id number])      Date of Surgery: 11/20/2020    Pre-op Diagnosis: pleural effusion    Post-op Diagnosis: Pleural effusion    Procedure(s):  ULTRASOUND, CHEST, REAL TIME WITH IMAGE DOCUMENTATION: 98119 (CPT??)  Note: Revisions to procedures should be made in chart - see Procedures activity.    Performing Service: Pulmonary  Surgeon(s) and Role:     * Jerelyn Charles, MD - Primary     * Arsema Tusing Rogelia Mire, MD - Fellow - Interventional    Assistant: None    Findings:   Thoracentesis with effusion seen  Attempted, due to size, attempted once, minimal effusion to aspirate, procedure aborted  Post procedure ultrasound with lung sliding, no lung point sign    Anesthesia: None    Estimated Blood Loss: minimal    Complications: None    Specimens: None collected    Implants: * No implants in log *    Surgeon Notes: I was present and scrubbed for the entire procedure    Benjamine Strout A Marjarie Irion   Date: 11/20/2020  Time: 2:34 PM

## 2020-11-20 NOTE — Unmapped (Signed)
Adult Nutrition Assessment Note    Visit Type: RN Consult  Reason for Visit: Per Admission Nutrition Screen (Adult)      HPI & PMH:  40 y.o.??male??with complex PMHx including MRSA PNA/bactermia, Candida Fungemia, COVID infection and Ph+ ALL who is being admitted pleuritic chest pain concerning for infection.     Anthropometric Data:  Height: 184.7 cm (6' 0.72)   Admission weight: (!) 105.9 kg (233 lb 7.5 oz)  Last recorded weight: (!) 106.5 kg (234 lb 11.2 oz)  IBW: 82.74 kg  Percent IBW: 127.99 %  BMI: Body mass index is 31.21 kg/m??.   Usual Body Weight: Unable to obtain at this time     Weight history prior to admission: per chart 16 lb weight loss in 1 month (6.4% - severe)   Wt Readings from Last 10 Encounters:   11/19/20 (!) 106.5 kg (234 lb 11.2 oz)   11/11/20 (!) 109 kg (240 lb 3.2 oz)   10/31/20 (!) 113.4 kg (250 lb)   10/27/20 (!) 114.3 kg (251 lb 15.8 oz)   10/20/20 (!) 114.4 kg (252 lb 4.8 oz)   10/13/20 (!) 111.5 kg (245 lb 14.4 oz)   10/13/20 (!) 111.5 kg (245 lb 14.4 oz)   10/06/20 (!) 110.1 kg (242 lb 11.2 oz)   09/21/20 (!) 112.9 kg (248 lb 14.4 oz)   09/21/20 (!) 112.9 kg (248 lb 14.4 oz)        Weight changes this admission:   Last 5 Recorded Weights    11/18/20 1437 11/19/20 1624   Weight: (!) 105.9 kg (233 lb 7.5 oz) (!) 106.5 kg (234 lb 11.2 oz)        Nutrition Focused Physical Exam:  Unable to complete at this time due to patient availability       NUTRITIONALLY RELEVANT DATA     Medications:   Nutritionally pertinent medications reviewed and evaluated for potential food and/or medication interactions and include deltasone     Labs:   Nutritionally pertinent labs reviewed.     Nutrition History:   November 20, 2020: Prior to admission: Patient unable to provide at this time. Patient off unit for bronchoscopy this afternoon    Allergies, Intolerances, Sensitivities, and/or Cultural/Religious Dietary Restrictions: onion    Current Nutrition:  Oral intake        Nutrition Orders   (From admission, onward)             Start     Ordered    11/18/20 1035  Nutrition Therapy Regular/House  Effective now        Question:  Nutrition Therapy:  Answer:  Regular/House    11/18/20 1034                   Nutritional Needs:   Healthy balance of carbohydrate, protein, and fat.       Malnutrition assessment not yet completed at this time due lack of nutrition history and inability to complete nutrition focused physical exam (NFPE).     GOALS and EVALUATION     ??? Patient to meet 75% or greater of nutritional needs via combination of meals, snacks, and/or oral supplements within admit.  - New    Motivation, Barriers, and Compliance:  Evaluation of motivation, barriers, and compliance pending at this time due to patient availability     NUTRITION ASSESSMENT     ??? Current  nutrition therapy is appropriate although not meeting nutritional  needs at this time due to decreased  appetite   ??? Patient has consumed 25-50% of 2 meals documented so far      Discharge Planning:   Monitor for potential discharge needs with multi-disciplinary team.       NUTRITION INTERVENTIONS and RECOMMENDATION     1. Continue Regular diet as tolerated  2. Offer preferred Ensure prn  3. Small/frequent meals and snacks    Follow-Up Parameters:   1-2 times per week (and more frequent as indicated)    Gladys Damme MS, RD, LD, CNSC  [phone]

## 2020-11-20 NOTE — Unmapped (Signed)
Problem: Adult Inpatient Plan of Care  Goal: Plan of Care Review  Outcome: Ongoing - Unchanged  Flowsheets (Taken 11/20/2020 0524)  Progress: no change  Plan of Care Reviewed With: patient  Note: Pt VSS, afebrile. Oxy given for pain per MAR orders. Pt denies n/v/d overnight. NPO for procedure today. Pt addiment about not being confined to room after 2100 - explained policy to patient, charge RN gave one time pass overnight to leave the unit. Day team to address this today. Pt denies any other needs or concerns at this time.      Problem: Self-Care Deficit  Goal: Improved Ability to Complete Activities of Daily Living  Intervention: Promote Activity and Functional Independence  Recent Flowsheet Documentation  Taken 11/19/2020 1936 by Olevia Bowens, RN  Self-Care Promotion: independence encouraged     Problem: Adult Inpatient Plan of Care  Goal: Optimal Comfort and Wellbeing  Outcome: Ongoing - Unchanged     Problem: Infection  Goal: Absence of Infection Signs and Symptoms  Outcome: Ongoing - Unchanged     Problem: Adult Inpatient Plan of Care  Goal: Readiness for Transition of Care  Outcome: Ongoing - Unchanged

## 2020-11-20 NOTE — Unmapped (Signed)
Indication:  pleural effusion    Procedure:  Limited Chest Ultrasound right    Findings:  With the patient in the Sitting position, the Right was examined in the mid scapular line.      A Small was noted.    Interpretation:  Small pleural effusion Thoracentesis attempted once, unsuccessful, therefore aborted.    Attestation:  I personally performed and interpretted the [place] images which are saved in PACS or on the [place] machine for future review.

## 2020-11-20 NOTE — Unmapped (Signed)
TPA Administration Procedure note    Indication:  Total Occlusion    The Venous Access Team (VAT) was paged to assess this patient's central catheter for a total occlusion.  The patency of the occluded line was verified per protocol and found to be totally occluded.  TPA was instilled into the red port per protocol.  New clave was placed and line was flagged per protocol while TPA was allowed to dwell.    Prior to use, vat nurse applied the tpa to the red lumen and was then able to pull back and waste the tpa and flushed the line with good blood return noted.     Thank You,    Cyndie Mull RN Venous Access Team, [phone]       Workup Time:  30 minutes

## 2020-11-20 NOTE — Unmapped (Signed)
Care Management  Initial Transition Planning Assessment              General  Care Manager assessed the patient by : Medical record review, Discussion with Clinical Care team, Telephone conversation with family  Orientation Level: Oriented X4  Functional level prior to admission: Independent  Reason for referral: Discharge Planning    Contact/Decision Maker  Extended Emergency Contact Information  Primary Emergency Contact: Hofacker,[name]  Mobile Phone: [phone]  Relation: Spouse  Preferred language: ENGLISH  Interpreter needed? No    Legal Next of Kin / Guardian / POA / Advance Directives     HCDM (patient stated preference): Adam Keith, Adam Keith - [phone]    Advance Directive (Medical Treatment)  Does patient have an advance directive covering medical treatment?: Patient does not have advance directive covering medical treatment. (Patient deems his wife, [name], as his HCDM should he need one.)  Reason patient does not have an advance directive covering medical treatment:: Patient does not wish to complete one at this time.    Health Care Decision Maker [HCDM] (Medical & Mental Health Treatment)  Healthcare Decision Maker: HCDM documented in the HCDM/Contact Info section.  Information offered on HCDM, Medical & Mental Health advance directives:: Patient given information.         Readmission Information                                     Did the following happen with your discharge?                                                     Patient Information  Lives with: Spouse/significant other, Children    Type of Residence: Private residence        Location/Detail: 4 Steps to enter the front of this residence   Type of Residence: Mailing Address:  [address]  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  Past Surgical History:   has a past surgical history that includes Bone Marrow Biopsy & Aspiration (01/21/2020); IR Insert Port Age Greater Than 5 Years (03/10/2020); and pr bronchoscopy,diagnostic w lavage (Bilateral, 07/20/2020).   Previous admit date: 11/01/2020    Primary Insurance- Payor: MEDICAID  / Plan: MEDICAID [place] ACCESS / Product Type: *No Product type* /   Secondary Insurance - None  Prescription Coverage - Medicaid  Preferred Pharmacy - [facility]  [id number] [place], [place] - [address]  [facility]    Transportation home: Private vehicle (Family can drive him home)            Support Systems/Concerns: Spouse, Family Members    Responsibilities/Dependents at home?: No    Home Care services in place prior to admission?: Yes  Type of Home Care services in place  prior to admission: Home infusion  Current Home Care provider (Name/Phone #): [facility]            Equipment Currently Used at Home: none       Currently receiving outpatient dialysis?: No       Financial Information       Need for financial assistance?: No       Social Determinants of Health  Social Determinants of Health were addressed in provider documentation.  Please refer to patient history.    Complex Discharge Information    Is patient identified as a difficult/complex discharge?: No                                                               Interventions:       Discharge Needs Assessment  Concerns to be Addressed: discharge planning    Clinical Risk Factors: Principal Diagnosis: Cancer, Stroke, COPD, Heart Failure, AMI, Pneumonia, Joint Replacment    Barriers to taking medications: No    Prior overnight hospital stay or ED visit in last 90 days: Yes              Anticipated Changes Related to Illness: none    Equipment Needed After Discharge: none    Discharge Facility/Level of Care Needs: other (see comments) (Home)    Readmission  Risk of Unplanned Readmission Score: UNPLANNED READMISSION SCORE: 44.3%  Predictive Model Details          44% (High)  Factor Value    Calculated 11/19/2020 16:04 19% Number of active Rx orders 47    [place] Risk of Unplanned Readmission Model 17% Number of hospitalizations in last year 6     13% Number of ED visits in last six months 4     6% Diagnosis of cancer present     6% ECG/EKG order present in last 6 months     5% Encounter of ten days or longer in last year present     5% Diagnosis of electrolyte disorder present     4% Restraint order present in last 6 months     4% Imaging order present in last 6 months     4% Latest hemoglobin low (11.2 g/dL)     4% Phosphorous result present     3% Charlson Comorbidity Index 4     3% Active corticosteroid Rx order present     3% Latest creatinine high (1.60 mg/dL)     2% Diagnosis of renal failure present     2% Age 40     1% Future appointment scheduled     1% Current length of stay 1.23 days      Readmitted Within the Last 30 Days? (No if blank) Yes  Patient at risk for readmission?: Yes    Discharge Plan  Screen findings are: Discharge planning needs identified or anticipated (Comment).    Expected Discharge Date: 11/22/2020    Expected Transfer from Critical Care:      Quality data for continuing care services shared with patient and/or representative?: N/A  Patient and/or family were provided with choice of facilities / services that are available and appropriate to meet post hospital care needs?: N/A   List choices in order highest to lowest preferred, if applicable. : [facility]  Initial Assessment complete?: Yes

## 2020-11-20 NOTE — Unmapped (Signed)
Problem: Adult Inpatient Plan of Care  Goal: Plan of Care Review  Outcome: Ongoing - Unchanged  Goal: Patient-Specific Goal (Individualized)  Outcome: Ongoing - Unchanged  Goal: Absence of Hospital-Acquired Illness or Injury  Outcome: Ongoing - Unchanged  Intervention: Identify and Manage Fall Risk  Recent Flowsheet Documentation  Taken 11/20/2020 1157 by Oretha Ellis, RN  Safety Interventions:   low bed   fall reduction program maintained  Intervention: Prevent and Manage VTE (Venous Thromboembolism) Risk  Recent Flowsheet Documentation  Taken 11/20/2020 1157 by Oretha Ellis, RN  Activity Management:   activity adjusted per tolerance   sitting, edge of bed  Intervention: Prevent Infection  Recent Flowsheet Documentation  Taken 11/20/2020 1157 by Oretha Ellis, RN  Infection Prevention: hand hygiene promoted  Goal: Optimal Comfort and Wellbeing  Outcome: Ongoing - Unchanged  Goal: Readiness for Transition of Care  Outcome: Ongoing - Unchanged  Goal: Rounds/Family Conference  Outcome: Ongoing - Unchanged     Problem: Infection  Goal: Absence of Infection Signs and Symptoms  Outcome: Ongoing - Unchanged  Intervention: Prevent or Manage Infection  Recent Flowsheet Documentation  Taken 11/20/2020 1157 by Oretha Ellis, RN  Infection Management: aseptic technique maintained  Isolation Precautions: contact precautions maintained     Problem: Self-Care Deficit  Goal: Improved Ability to Complete Activities of Daily Living  Outcome: Ongoing - Unchanged     Problem: COPD (Chronic Obstructive Pulmonary Disease) Comorbidity  Goal: Maintenance of COPD Symptom Control  Outcome: Ongoing - Unchanged     Problem: Hypertension Comorbidity  Goal: Blood Pressure in Desired Range  Outcome: Ongoing - Unchanged

## 2020-11-20 NOTE — Unmapped (Signed)
Physician Discharge Summary [facility]  4 ONC [facility]CCA  [address]  [place] [address]  Dept: [phone]  Loc: [phone]     Identifying Information:   Adam Keith  1980/05/19  [id number]    Primary Care Physician: [name], MD     Referring Physician: Referred Self     Code Status: Full Code    Admit Date: 11/17/2020    Discharge Date: 11/20/2020     Discharge To: Home with Home Health and/or PT/OT    Discharge Service: [facility] - Hematology APP Floor Team (MEDQ)     Discharge Attending Physician: Guerry Bruin, MD    Discharge Diagnoses:  Active Problems:    * No active hospital problems. *  Resolved Problems:    * No resolved hospital problems. *      Outpatient Provider Follow Up Issues:   Supportive Care Recommendations:  We recommend based on the patient???s underlying diagnosis and treatment history the following supportive care:    1. Antimicrobial prophylaxis:  ALL (in remission, receiving post-remission therapy): Bacterial: Levofloxacin 500mg  PO daily (when absolute neutrophils </= 0.5);   Fungal: Fluconazole 400mg  PO daily (when absolute neutrophils </= 0.5);   Viral: Valacyclovir 500mg  PO daily (Continuous);   PJP:  SMX/TMP DS PO BID twice weekly (Sat/Sun)    2. Blood product support:  Leukoreduced blood products are required.  Irradiated blood products are preferred, but in case of urgent transfusion needs non-irradiated blood products may be used:     -  RBC transfusion threshold: transfuse 1 units for Hgb < 7 g/dL.  -  Platelet transfusion threshold: transfuse 1 unit of platelets for platelet count < 10, or for bleeding or need for invasive procedure.    Based on the patient's disease status and intensity of therapy, complete blood count with differential should be evaluated 1 times per week and used to guide transfusion support    3. Hematopoietic growth factor support: none    Hospital Course:   Adam Keith is an 40 y.o. 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 CT Scan upon admission showed loculated pleural effusion c/f empyema. He also showed new pulmonary nodules that need evaluation. IP was consulted for diagnostic thoracentesis, which was attempted but unsuccessful. Due to scheduling, a bronchoscopy could be done until next week. CT scan for planning completed before discharge.*** Also given the clinical stability of the patient, this will be pursued as an outpatient. Plan is for bronchoscopy on 8/22. These are being set up by the IP team. His further detailed hospital course is noted below.     Pleuritic chest pain, concerning for empyema: Febrile on 8/12 with right side pleuritic chest pain and started on Linezolid. CXR completed on 8/16 concerning for empyema. CT Chest shows loculated pleural effusion. IP consulted upon admission for evaluation and consideration of diagnostic thoracentesis. CT Chest also shows worsening nodules and will also discuss bronchoscopy with IP as well. Appreciated ICID and IP help. He underwent thoracentesis on 8/19 which was unsuccessful.     New Pulmonary Nodules: Seen incidentally on CT scan done as noted above. C/f new infection. IP already following and were planning on bronchoscopy and biopsy. However, due to scheduling, this could not be done until next week. Therefore given clinical stability of patient, this will be pursued as an outpatient. IP will be also coordinating outpatient follow up and  bronchoscopy.      MRSA PNA/bacteremia: Complex history with multiple episodes of MRSA PNA/bacteremia requiring intubation, most recent admission on 7/31. Discharged with Daptomycin course with plan to complete course on 8/17, however given new symptoms will continue Daptomycin on admission. Followed by ICID with Dr. [name] in clinic. Per ICID recs, stopped Daptomycin on 8/17. Requested new BCx on 8/18. 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 Received GRAAPH induction. Induction course was c/b septic shock, candida krusei fungemia, MRSA bacteremia and septic emobli c/b acute renal failure and respiratory distress requriing dialysis and intubation. Post-induction BMBx demonstrated flow-based MRD-negative remission but low-level BCR-ABL persisted. He is s/p cycle 3 Blinatumomab and Ponatinib c/b MRSA pneumonia. Plan is for one IT treatment per cycle. Due for Cycle 4, however on HOLD until after patient's family vacation.   - Ponatinib nightly   - ASA      CKD: Arising from AKI and acute renal failure due to sepsis. Now s/p dialysis. His creatnine remains stable but appears to have developed CKD d/t incompletely healed AKI. Cr upon admission is stable at 2.15. Per chart review, his new baseline over the past few months is ~1.9 - 2.2.      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 Patient reports hypersensitivity of left hearing and difficulty with complex sounds and multiple conversations  - AVOID Lasix/loop diuretics, other ototoxic medications     History of Candidemia: Recently restarted ppx with Isavoriconazole. Followed by ICID as an outpatient.   - Isavoriconazole ppx daily as noted above      Hx DRESS:  Followed by Dr. Caryn Section in Dermatology. Takes Prednisone 20 mg daily.   - Prednisone 20 mg daily      Anxiety/Depression: Chronic condition. Followed by [facility] and sees local provider for ongoing Psychotherapy.   - Nortriptyline 50 mg nightly   - Klonopin 1 mg PO BID PRN      HTN: Chronic condition since diagnosis. Home regimen includes Amlodipine and Coreg.   - Coreg   - Amlodipine      Chronic pain: Takes Oxycontin 15 mg BID and Gabapentin 300 mg TID. Followed by Palliative Care.    - Oxycontin BID   - Gabapentin 300 mg TID      Cancer related fatigue: Patient endorses fatigue with onset of cancer symptoms or treatment.       Immunocompromised status: Patient is immunocompromised secondary to chemotherapy.   - Antimicrobial prophylaxis as above     Impending Electrolyte Abnormality Secondary to Chemotherapy and/or IV Fluids  - Daily Electrolyte monitoring  - Replete per [facility] guidelines.          Procedures:  Thoracentesis: unsuccessful   No admission procedures for hospital encounter.  ______________________________________________________________________  Discharge Medications:     Your Medication List      ASK your doctor about these medications    acetaminophen 325 MG tablet  Commonly known as: TYLENOL  Take 650 mg by mouth every six (6) hours as needed for pain.     amLODIPine 10 MG tablet  Commonly known as: NORVASC  Take 1 tablet (10 mg total) by mouth daily.     aspirin 81 MG chewable tablet  Chew 1 tablet (81 mg total) daily.     carvediloL 12.5 MG tablet  Commonly known as: COREG  TAKE  1 TABLET BY MOUTH TWO TIMES A DAY.     clonazePAM 0.5 MG tablet  Commonly known as: KlonoPIN  Take 1 tablet (0.5 mg total) by mouth two (2) times a day as needed for anxiety or sleep.     CRESEMBA 186 mg Cap capsule  Generic drug: isavuconazonium sulfate  Take 2 capsules (372 mg total) by mouth daily.     gabapentin 300 MG capsule  Commonly known as: NEURONTIN  Take 1 capsule (300 mg total) by mouth Three (3) times a day.     hydrOXYzine 25 MG tablet  Commonly known as: ATARAX  Take 1 tablet (25 mg total) by mouth every six (6) hours as needed.     ICLUSIG 30 mg tablet  Generic drug: PONATinib  Take 1 tablet (30 mg total) by mouth daily. Swallow tablets whole. Do not crush, break, cut or chew tablets.     linezolid 600 mg tablet  Commonly known as: ZYVOX  Take 1 tablet (600 mg total) by mouth Two (2) times a day.     nortriptyline 75 MG capsule  Commonly known as: PAMELOR  Take 1 capsule (75 mg total) by mouth nightly.     OxyCONTIN 15 mg 12 hr crush resistant ER/CR tablet  Generic drug: oxyCODONE  TAKE 1 TABLET (15 MG TOTAL) BY MOUTH TWO (2) TIMES A DAY FOR 28 DAYS.     predniSONE 20 MG tablet  Commonly known as: DELTASONE  Take 1 tablet (20 mg total) by mouth daily for 21 days.     sodium chloride 0.9 % SolP 50 mL with DAPTOmycin 500 mg SolR 1,150 mg, OVERFILL 50 Inj 7 mL  Infuse 1,150 mg into a venous catheter daily.     sulfamethoxazole-trimethoprim 800-160 mg per tablet  Commonly known as: BACTRIM DS  Take 1 tablet (160 mg of trimethoprim total) by mouth 2 times a day on Saturday, Sunday. For prophylaxis while on chemo.     valACYclovir 500 MG tablet  Commonly known as: VALTREX  Take 1 tablet (500 mg total) by mouth daily.            Allergies:  Bupropion hcl, Cefepime, Ceftaroline fosamil, Dapsone, Onion, Vancomycin analogues, Bismuth subsalicylate, and Furosemide  ______________________________________________________________________  Pending Test Results (if blank, then none):  Pending Labs     Order Current Status    Blood Culture In process    Blood Culture In process          Most Recent Labs:  All lab results last 24 hours -   Recent Results (from the past 24 hour(s))   Basic Metabolic Panel    Collection Time: 11/20/20  5:51 AM   Result Value Ref Range    Sodium 137 135 - 145 mmol/L    Potassium 3.7 3.4 - 4.8 mmol/L    Chloride 105 98 - 107 mmol/L    CO2 26.0 20.0 - 31.0 mmol/L    Anion Gap 6 5 - 14 mmol/L    BUN 20 9 - 23 mg/dL    Creatinine 1.61 (H) 0.60 - 1.10 mg/dL    BUN/Creatinine Ratio 11     eGFR CKD-EPI (2021) Male 48 (L) >=60 mL/min/1.45m2    Glucose 86 70 - 99 mg/dL    Calcium 9.3 8.7 - 09.6 mg/dL   Magnesium Level    Collection Time: 11/20/20  5:51 AM   Result Value Ref Range    Magnesium 2.1 1.6 - 2.6 mg/dL   Phosphorus Level  Collection Time: 11/20/20  5:51 AM   Result Value Ref Range    Phosphorus 4.6 2.4 - 5.1 mg/dL   CBC w/ Differential    Collection Time: 11/20/20  5:51 AM   Result Value Ref Range    WBC 13.3 (H) 3.6 - 11.2 10*9/L    RBC 3.75 (L) 4.26 - 5.60 10*12/L    HGB 11.5 (L) 12.9 - 16.5 g/dL    HCT 10.9 (L) 32.3 - 48.0 %    MCV 90.9 77.6 - 95.7 fL    MCH 30.7 25.9 - 32.4 pg    MCHC 33.8 32.0 - 36.0 g/dL    RDW 55.7 (H) 32.2 - 15.2 %    MPV 8.1 6.8 - 10.7 fL    Platelet 388 150 - 450 10*9/L    Neutrophils % 71.7 %    Lymphocytes % 20.2 %    Monocytes % 6.4 %    Eosinophils % 1.2 %    Basophils % 0.5 %    Absolute Neutrophils 9.5 (H) 1.8 - 7.8 10*9/L    Absolute Lymphocytes 2.7 1.1 - 3.6 10*9/L    Absolute Monocytes 0.9 (H) 0.3 - 0.8 10*9/L    Absolute Eosinophils 0.2 0.0 - 0.5 10*9/L    Absolute Basophils 0.1 0.0 - 0.1 10*9/L       Relevant Studies/Radiology (if blank, then none):  CT Chest Wo Contrast    Result Date: 11/18/2020  EXAM: CT CHEST WO CONTRAST DATE: 11/18/2020 11:25 AM ACCESSION: [id number] UN DICTATED: 11/18/2020 11:27 AM INTERPRETATION LOCATION: [facility] CLINICAL INDICATION: 40 years old Male with concern for empyema on CXR, R side pleuritic chest pain ; Chest pain or SOB, pleurisy or effusion suspected  COMPARISON: CT chest dated 29 October 2020 and chest radiography dated 17 November 2020. TECHNIQUE: Contiguous noncontrast axial images were reconstructed through the chest following a single breath hold helical acquisition.  Images were reformatted in the axial and sagittal planes. MIP slabs were also constructed. FINDINGS: LUNGS AND AIRWAYS: Moderate centrilobular emphysema. New 5.5 cm longest diameter lobular masslike opacity in the lateral segment of the right middle lobe (5:105). Right middle lobe bronchial wall thickening. New low density (-5 to -20 Hounsfield units) tubular and nodular opacities in the anterior segment of the right upper lobe and in the lateral segment of the right middle lobe. The central airways are patent. PLEURA AND DIAPHRAGM: New in comparison to the prior study is a small volume pleural effusion with small areas of loculation inferolaterally within the right hemithorax. No pneumothorax. Normal diaphragm. MEDIASTINUM AND LYMPH NODES: No enlarged intrathoracic lymph nodes. Esophagus is normal. HEART AND PERICARDIUM: Left ventricular myocardial calcifications again identified. Normal heart size. No coronary artery calcifications. No pericardial effusion. VASCULATURE: Aorta is normal in caliber. Main pulmonary artery is normal in size.  BONES AND CHEST WALL: Normal visualized thoracic skeleton. No chest wall abnormality. UPPER ABDOMEN: Imaged upper abdomen is normal. OTHER: No actionable thyroid nodule. MEDICAL DEVICES: Right upper extremity PICC terminates in the lower superior vena cava.     *New 5.5 summary longest diameter lobular masslike opacity in the lateral segment of the right middle lobe which could be postinfectious/inflammatory in etiology. *New tubular and nodular opacities in the right upper and right middle lobes favored to represent mucus within dilated bronchi. *New small laterally loculated right pleural effusion with no CT signs of empyema. If there is clinical concern for infection, consider fluid sampling via ultrasound-guided thoracentesis. *Follow-up noncontrast CT chest is recommended 4-6 weeks after  treatment to document resolution of the above findings.     XR Chest 2 views    Result Date: 11/17/2020  EXAM: XR CHEST 2 VIEWS DATE: 11/17/2020 2:02 PM ACCESSION: [id number] UN DICTATED: 11/17/2020 2:12 PM INTERPRETATION LOCATION: [facility] CLINICAL INDICATION: 40 years old Male with [place] ; PNEUMONIA  - J15.212 - Pneumonia of right lung due to methicillin resistant Staphylococcus aureus (MRSA), unspecified part of lung (CMS - HCC) - R78.81 - MRSA bacteremia - B95.62 - MRSA bacteremia  COMPARISON: Chest radiography dated 06 November 2020 and CT chest dated 29 October 2020 TECHNIQUE: PA and Lateral Chest Radiographs. FINDINGS: MEDICAL DEVICES: Right upper extremity PICC terminates in the lower superior vena cava. HEART AND MEDIASTINUM: Cardiac silhouette is normal in size. Normal thoracic aorta. LUNGS AND PLEURA: New pleural-based opacity and pleural stripe thickening inferolaterally within the right hemithorax. Increased right lower lung linear opacifications. Left lung is clear and the left costophrenic sulcus is sharp. No pneumothorax. BONES AND SOFT TISSUES: Normal bones.     *New pleural-based opacity and pleural stripe thickening inferolaterally within the right hemithorax suspicious for loculated pleural effusion. Linear opacities of the right lower lung likely due to scarring and/or subsegmental atelectasis. If there is clinical concern for possible empyema, consider further evaluation with CT of the chest with contrast.    ______________________________________________________________________                    Appointments which have been scheduled for you    Nov 23, 2020  BRONCHOSCOPY, RIGID OR FLEXIBLE, INCLUDE FLUORO WHEN PERFORMED; W/COMPUTER-ASSIST, IMAGE-GUIDED NAVIGATION, BRONCH, RIGID OR FLEXIBLE, INCLUDING FLUORO GUIDANCE, WHEN PERFORMED; W EBUS GUIDED TRANSTRACHEAL AND/OR TRANSBRONCHIAL SAMPLING, 3 OR MORE MEDIASTINAL AND/OR HILAR LYMPH NODE STATIONS OR STRUCTURES with Wilfrid Lund, DO  MAIN PERIOP [facility] REGION) [address]  [place] [address]  [phone]   Nov 23, 2020 11:15 AM  (Arrive by 11:00 AM)  RETURN  GENERAL with Michel Bickers, MD  [facility] DERMATOLOGY [address] [place] [facility] REGION) [address] [place] [address]  [phone]        Nov 24, 2020  1:00 PM  (Arrive by 12:30 PM)  RETURN VIDEO MYCHART with Ward Givens, PMHNP  [facility] CCSP 2ND FLR CANCER HOSP [place] [facility] REGION) [address]  [place] [address]  [phone]   Please sign into My [place] Chart at least 15 minutes before your appointment to complete any unfinished steps in the eCheck-In process. eCheck-In must be completed before starting your video visit.    Please visit [email or web address] for information about our safe promise as you receive care at [facility].    My [place] chart allows you to manage your health, send messages to your provider, view your test results, schedule and manage appointments, and request prescription refills securely and conveniently from your computer or mobile device.    You can go to [email or web address] to Sign in to your My [place] Chart account with your username and password. If you have forgotten them, please choose the Forgot Username? and/or Forgot Password? links to gain access. You can also access your My [place] Chart account with the free MyChart mobile app for Android or iPhone.    If you need further assistance with accessing your My [place] Chart account or for assistance in reaching your provider's office to reschedule or cancel your appointment  call  [facility] at [phone].  Dec 03, 2020 10:00 AM  (Arrive by 9:30 AM)  RETURN VIDEO DIRECT LINK with Everlena Cooper, MD  [place] HEMATOLOGY ONCOLOGY 2ND FLR CANCER HOSP ([place])  Arrive at: This is a Video Visit [address]  [place] [address]  [phone]   A direct link will be sent to you by your provider at the time of your video appointment. DO NOT go to the clinic.            Dec 14, 2020  7:30 AM  (Arrive by 7:00 AM)  NURSE LAB DRAW with ADULT ONC LAB  [facility] ADULT ONCOLOGY LAB DRAW STATION [place] [facility] REGION) [address]  [place] [address]  [phone]        Dec 14, 2020  8:00 AM  (Arrive by 7:30 AM)  RETURN ACTIVE [place] with Lenon Ahmadi, [place]  [facility] HEMATOLOGY ONCOLOGY 2ND FLR CANCER HOSP [facility] REGION) [address]  [place] [address]  [phone]        Dec 14, 2020  8:30 AM  (Arrive by 8:00 AM)  LEVEL 150 with [employer] CHAIR 25  [place] ONCOLOGY INFUSION [place] [facility] REGION) [address]  [place] [address]  [phone] Dec 18, 2020 10:30 AM  (Arrive by 10:00 AM)  RETURN ACTIVE [place] with Park Breed, MD  [facility] HEMATOLOGY ONCOLOGY 2ND FLR CANCER HOSP [facility] REGION) [address]  [place] [address]  [phone]        Jan 25, 2021 11:00 AM  (Arrive by 10:45 AM)  NEW PFT 60 with PFT 1  [facility] PULMONARY SPECIALTY FUNCT [place] [facility] REGION) [address]  [place] [address]  [phone]        Jan 25, 2021  2:30 PM  (Arrive by 2:15 PM)  NEW  GENERAL with Burman Riis Diddams, MD  [facility] PULMONARY SPECIALTY CL [place] [facility] REGION) [address]  [place] [address]  [phone]             ______________________________________________________________________  Discharge Day Services:  BP 141/96  - Pulse 89  - Temp 36 ??C (96.8 ??F) (Tympanic)  - Resp 21  - Ht 184.7 cm (6' 0.72)  - Wt (!) 106.5 kg (234 lb 11.2 oz)  - SpO2 97%  - BMI 31.21 kg/m??   Pt seen on the day of discharge and determined appropriate for discharge.    Condition at Discharge: fair    Length of Discharge: I spent greater than 30 mins in the discharge of this patient.    Hinda Glatter, PA-C   Hematology/Oncology Department   [facility]   Pager: [phone] Care patients safe during the COVID-19 crisis  [email or web address]  ??  Video #2: Support for cancer patients and their caregivers during the COVID-19 pandemic  [email or web address]              Appointments which have been scheduled for you    Nov 23, 2020  ROBOT ION BRONCHOSCOPY,RIGID OR FLEXIBLE,INCLUDE FLUORO WHEN PERFORMED; W/COMPUTER-ASSIST,IMAGE-GUIDED NAVIGATION, BRONCH, RIGID OR FLEXIBLE, INCLUDING FLUORO GUIDANCE, WHEN PERFORMED; W EBUS GUIDED TRANSTRACHEAL AND/OR TRANSBRONCHIAL SAMPLING, 3 OR MORE MEDIASTINAL AND/OR HILAR LYMPH NODE STATIONS OR STRUCTURES with Wilfrid Lund, DO  MAIN PERIOP [facility] REGION) [address]  [place] [address]  [phone]   Nov 24, 2020  1:00 PM  (  Arrive by 12:30 PM)  RETURN VIDEO MYCHART with Ward Givens, PMHNP  [facility] CCSP 2ND FLR CANCER HOSP [place] [facility] REGION) [address]  [place] [address]  [phone]   Please sign into My [place] Chart at least 15 minutes before your appointment to complete any unfinished steps in the eCheck-In process. eCheck-In must be completed before starting your video visit.    Please visit [email or web address] for information about our safe promise as you receive care at [facility].    My [place] chart allows you to manage your health, send messages to your provider, view your test results, schedule and manage appointments, and request prescription refills securely and conveniently from your computer or mobile device.    You can go to [email or web address] to Sign in to your My [place] Chart account with your username and password. If you have forgotten them, please choose the Forgot Username? and/or Forgot Password? links to gain access. You can also access your My [place] Chart account with the free MyChart mobile app for Android or iPhone.    If you need further assistance with accessing your My [place] Chart account or for assistance in reaching your provider's office to reschedule or cancel your appointment  call [place] [facility] at [phone].         Dec 03, 2020 10:00 AM  (Arrive by 9:30 AM)  RETURN VIDEO DIRECT LINK with Everlena Cooper, MD  [place] HEMATOLOGY ONCOLOGY 2ND FLR CANCER HOSP ([place]ION)  Arrive at: This is a Video Visit [address]  [place] [address]  [phone]   A direct link will be sent to you by your provider at the time of your video appointment. DO NOT go to the clinic.          Dec 14, 2020  7:30 AM  (Arrive by 7:00 AM)  NURSE LAB DRAW with ADULT ONC LAB  [facility] ADULT ONCOLOGY LAB DRAW STATION [place] REGION) [address]  [place] [address]  [phone]      Dec 14, 2020  8:00 AM  (Arrive by 7:30 AM)  RETURN ACTIVE [place] with Lenon Ahmadi, [place]  [facility] HEMATOLOGY ONCOLOGY 2ND FLR CANCER HOSP [facility] REGION) [address]  [place] [address]  [phone]      Dec 14, 2020  8:30 AM  (Arrive by 8:00 AM)  LEVEL 150 with [employer] CHAIR 25   ONCOLOGY INFUSION [place] [facility] REGION) [address]  [place] [address]  [phone]      Dec 18, 2020 10:30 AM  (Arrive by 10:00 AM)  RETURN ACTIVE [place] with Park Breed, MD  [facility] HEMATOLOGY ONCOLOGY 2ND FLR CANCER HOSP [facility] REGION) [address]  [place] [address]  [phone]      Dec 28, 2020 11:00 AM  (Arrive by 10:45 AM)  RETURN  GENERAL with Michel Bickers, MD  [facility] DERMATOLOGY [address] [place] [facility] REGION) [address] [place] [address]  [phone]      Jan 25, 2021 11:00 AM  (Arrive by 10:45 AM)  NEW PFT 60 with PFT 1  [facility] PULMONARY SPECIALTY FUNCT [place]  [facility] REGION) [address]  [place] [address]  [phone]      Jan 25, 2021  2:30 PM  (Arrive by 2:15 PM)  NEW  GENERAL with Burman Riis Diddams, MD  [facility] PULMONARY SPECIALTY CL [place] [facility] REGION) [address]  [place] [address]  [phone]           ______________________________________________________________________  Discharge Day Services:  BP 141/96  - Pulse 89  - Temp 36 ??C (96.8 ??F) (Tympanic)  - Resp 21  - Ht 184.7 cm (6' 0.72)  - Wt (!) 106.5 kg (234 lb 11.2 oz)  - SpO2 97%  - BMI 31.21 kg/m??   Pt seen on the day of discharge and determined appropriate for discharge.    Condition at Discharge: fair    Length of Discharge: I spent greater than 30 mins in the discharge of this patient.    Hinda Glatter, PA-C   Hematology/Oncology Department   [employer]   Pager: [phone]

## 2020-11-21 NOTE — Unmapped (Signed)
Pt discharged to home w/ PICC per orders.    Problem: Adult Inpatient Plan of Care  Goal: Plan of Care Review  Outcome: Ongoing - Unchanged  Goal: Patient-Specific Goal (Individualized)  Outcome: Ongoing - Unchanged  Goal: Absence of Hospital-Acquired Illness or Injury  Outcome: Ongoing - Unchanged  Goal: Optimal Comfort and Wellbeing  Outcome: Ongoing - Unchanged  Goal: Readiness for Transition of Care  Outcome: Ongoing - Unchanged  Goal: Rounds/Family Conference  Outcome: Ongoing - Unchanged     Problem: Infection  Goal: Absence of Infection Signs and Symptoms  Outcome: Ongoing - Unchanged

## 2020-11-21 NOTE — Unmapped (Signed)
IMMUNOCOMPROMISED HOST INFECTIOUS DISEASE PROGRESS NOTE    Assessment/Plan:     Adam 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  - 4/21 BMBx normocellular bone marrow 50% w/ 1% blasts (MRD)  - Extent of disease/CNS involvement: rare blast on prior CSF, intrathecal ppx (cytarabine, methotrexate, hydrocortisone) last 09/18/2020  - Cancer-related complications: TLS, hyperbilirubinemia, MRSA bacteremia w/ septic emboli/renal failure+dialysis/intubation, C. krusei fungemia  - Prior chemotherapy: [id number] induction with dasatinib (vincristine, dexamethasone and dasatinib); C1D1 01/24/2020; difficulty tolerating single-agent dasatinib  - Current chemotherapy: blinatumomab (anti-CD19/CD3), C1D1 = 3/18 (symtomatic, titrated up dose and restarted 3/21) + ponatinib (TKI)  - currently cycle 3/5  - Right IJ Port-A-Cath placed 04/15/2020  - Infection prophylaxis prior to admission: Bactrim 1 DS MWF, valacyclovir, s/p Evusheld 04/21/2020 x2 doses, isavuconazole  - on pred 20mg  daily PTA as below     Likely DIHS/DRESS, 06/12/2020 and new rash 8/01  - followed by Dr. Caryn Section, dermatology  - 3/11 punch bx  - per prior ID notes possible culprits ceftaroline, posaconazole, dasatinib, sotrovimab  - received high dose steroids in 06/2020, 3/3 - 3/7 pred 50 x5d; 06/19/20 started at 50 mg prednisone taper  - flare on pred 10 mg daily  - pink rash noted on chest on 8/01, with rising eosinophils. 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     Infection History  Active infections:  #MRSA bacteremia, 11/01/2020  #RLL pulmonary consolidation on 11/01/2020 CXR, worsening effusion/consolidation+/-empyema 11/18/2020, CT 8/17 - new nodules  - 7/31 BCx 1/2 Staph aureus  - 7/31 LRCx Staph aureus  - 8/01 TTE without evidence of endocarditis  - S/p Port-a-cath removal on 8/03  - Blood Culture negative since 8/03  - Rx: 7/31 linezolid/cefepime --> linezolid/daptomycin/cefepime -->8/02 linezolid/daptomycin--->Discharged home on 8/03 Daptomycin -->8/12 Restarted Linezolid/Daptomycin  -Represented to the hospital 8/17 due to worsening of R pleuritic chest pain  -8/17--Linezolid ( Daptomycin ceased)-->  - 8/19 Failed Thoracocentesis by IP  - Planned for Bronch on 8/22    Prior infections     #Non-neutropenic fever and new pulmonary nodules/infiltrates, 07/17/20  - 4/15 BC x2 NGTD  - 4/16 CT Chest: multifocal somewhat nodular consolidative opacities in b/l lungs that are relatively new, most concerning for infectious PNA (possibly fungal, especially given his recent high dose steroid taper)  - 4/17 LRCx +2 MRSA  - 4/17 Legionella uAg, serum crypto Ag, serum Asp galactomannan, urine histo ag, B-D-glucan (Fungitell), serum CMV neg  - 4/18 bronch with ZOX:WRUE cx + MRSA, OP flora, RPP negative, Legionella cx, AFB cx, fungal cx, CMV PCR, PJP DFA, Asp galactomannan neg  - 7/28 CT chest resolved  Rx:  4/15 cefepime/dapto --> 4/16 cefepime --> 4/18 cefepime/isavu/linezolid -> 4/19 linezolid --> 4/21 isavuconazole/linezolid --> 5/10 isavuconazole -->  -8/17 : continues on Isavuconazole ( due to DDI with Ponatinib)     #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 Placebo  Tx: 10/25 vanc/cefepime --> 10/27 dapto/ceftaroline --> 11/5 ceftaroline/vanc  -> 11/16 vanc (stopped due to ototoxicity though other meds were more likely) -->Ceftaroline (w/ 1 dose DAP) 11/28 ->12/02-->Vanc 12/02 - ?        Antimicrobial Intolerance/allergy  Cefepime - rash with concern for DRESS flare  Ceftaroline - DRESS  Dapsone - possible agranulocytosis, per chart anaphylaxis  Vancomycin - probable ototoxicity (in combination with furosemide)  Isavuconazole - elevated LFTs in the setting of TKI             RECOMMENDATIONS    Diagnostic     ?? Recommend bronchoscopy with BAL  ?? Recommend - bacterial/fungal/AFB cultures on bronchoscopic samples     Lab monitoring for antimicrobial toxicities  ?? Monitor FBC/ eLFT's while on antimicrobials     Treatment     #MRSA Pneumonia/complicated by effusion/empyema  ?? CONT PO Linezolid 600mg  BD until post Bronchoscopy and pending clinical improvement.    In case patient is discharged we will follow up in ICID clinic    Hematologic Malignancies and BMT Infectious Diseases Follow-up  - Appointment: Date and Time to be advised  - Location: 2nd Floor, [facility],[address] [place], [place]  - Labs: weekly CBC with differential, CMP, ESR, CRP.  - Please fax labs to primary oncologist???s nurse navigator  - Antibiotics:   (a) PO Linezolid 600mg  BD End Date: To be finalized pending further Ix and clinical improvement          The [facility] ID service will continue to follow.  Please page the ID Transplant/Liquid Oncology Fellow consult at [phone] with questions.  Patient discussed with Dr. Leanord Hawking        Subjective:     Interval History:    History obtained from:patient.    Well, no new symptoms  Right pleuritic pain persists  Afebrile  Randel Pigg to pursue further IX as outpatient    Medications:  Antimicrobials:  Anti-infectives (From admission, onward)    Start Dose/Rate Route Frequency Ordered Stop    11/21/20 0900  sulfamethoxazole-trimethoprim (BACTRIM DS) 800-160 mg tablet 160 mg of trimethoprim         1 tablet Oral 2 times a day on saturday, sunday 11/18/20 1034 12/26/20 0859    11/18/20 1347  isavuconazonium sulfate (CRESEMBA) capsule 372 mg         372 mg Oral Daily (standard) 11/18/20 1034      11/18/20 1346  linezolid (ZYVOX) tablet 600 mg         600 mg Oral 2 times a day (standard) 11/18/20 1034 12/18/20 0859    11/18/20 1343  valACYclovir (VALTREX) tablet 500 mg         50 0 mg Oral Daily (standard) 11/18/20 1034            Prior/Current immunomodulators:  Blinatumomab, Ponatinib    Other medications reviewed.    Objective:     Vital Signs last 24 hours:  Temp:  [36 ??C (96.8 ??F)-37.2 ??C (99 ??F)] 36 ??C (96.8 ??F)  Heart Rate:  [75-98] 89  Resp:  [17-23] 21  BP: (127-152)/(85-100) 141/96  MAP (mmHg):  [96-115]  110  SpO2:  [96 %-100 %] 97 %    Physical Exam:  Patient Lines/Drains/Airways Status     Active Active Lines, Drains, & Airways     Name Placement date Placement time Site Days    PICC Double Lumen 11/09/20 Right Basilic 11/09/20  1204  Basilic  11              Const [x]  vital signs above    []  NAD, non-toxic appearance        Eyes [x]  Lids normal bilaterally, conjunctiva anicteric and noninjected OU     [] PERRL  [] EOMI        ENMT [x]  Normal appearance of external nose and ears, no nasal discharge        []  MMM, no lesions on lips or gums [x]  No thrush, leukoplakia, oral lesions  [x]  Dentition good []  Edentulous []  Dental caries present  []  Hearing normal  []  TMs with good light reflexes bilaterally         Neck [x]  Neck of normal appearance and trachea midline        []  No thyromegaly, nodules, or tenderness   []  Full neck ROM        Lymph [x]  No LAD in neck     []  No LAD in supraclavicular area     []  No LAD in axillae   []  No LAD in epitrochlear chains     []  No LAD in inguinal areas        CV [x]  RRR            []  No peripheral edema     []  Pedal pulses intact   [x]  No abnormal heart sounds appreciated   []  Extremities WWP         Resp [x]  Normal WOB at rest    []  No breathlessness with speaking, no coughing  []  CTA anteriorly    []  CTA bilaterally    Dec Breath sounds Right side      GI [x]  Normal inspection, NTND   [x]  NABS     []  No umbilical hernia on exam       []  No hepatosplenomegaly     []  Inspection of perineal and perianal areas normal        GU []  Normal external genitalia     [] No urinary catheter present in urethra   []  No CVA tenderness    []  No tenderness over renal allograft  Deferred      MSK []  No clubbing or cyanosis of hands       []  No vertebral point tenderness  [x]  No focal tenderness or abnormalities on palpation of joints in RUE, LUE, RLE, or LLE        Skin [x]  No rashes, lesions, or ulcers of visualized skin     []  Skin warm and dry to palpation         Neuro [x]  Face expression symmetric  []  Sensation to light touch grossly intact throughout    [x]  Moves extremities equally    [x]  No tremor noted        []  CNs II-XII grossly intact     []  DTRs normal and symmetric throughout []  Gait unremarkable        Psych [x]  Appropriate affect       [x]  Fluent speech         [x]  Attentive, good eye contact  [x]  Oriented to person, place, time          []   Judgment and insight are appropriate             Data for Medical Decision Making     Recent Labs   Lab Units 11/20/20  0551 11/19/20  0607 11/18/20  0054 11/17/20  1230   WBC 10*9/L 13.3* 12.4* 15.7* 15.8*   HEMOGLOBIN g/dL 16.1* 09.6* 04.5* 40.9*   PLATELET COUNT (1) 10*9/L 388 360 368 380   NEUTRO ABS 10*9/L 9.5* 9.2* 11.6* 13.7*   LYMPHO ABS 10*9/L 2.7 2.2 2.8 1.3   EOSINO ABS 10*9/L 0.2 0.1 0.1 0.1   BUN mg/dL 20 14 17 18    CREATININE mg/dL 8.11* 9.14* 7.82* 9.56*   AST U/L  --  14 21 24    ALT U/L  --  28 40 41   BILIRUBIN TOTAL mg/dL  --  0.2* 0.2* 0.2*   ALK PHOS U/L  --  112 128* 150*   POTASSIUM mmol/L 3.7 3.9 4.4 4.4   MAGNESIUM mg/dL 2.1 1.9  --   --    PHOSPHORUS mg/dL 4.6 3.9  -- --    CALCIUM mg/dL 9.3 9.0 9.8 9.6   CK TOTAL U/L  --   --   --  33.0*         New Micro Data  none    Recent Studies  none

## 2020-11-23 DIAGNOSIS — J15212 Pneumonia due to Methicillin resistant Staphylococcus aureus: Principal | ICD-10-CM

## 2020-11-23 DIAGNOSIS — J869 Pyothorax without fistula: Principal | ICD-10-CM

## 2020-11-23 MED ORDER — TEDIZOLID 200 MG TABLET
ORAL_TABLET | Freq: Every day | ORAL | 5 refills | 30 days | Status: CP
Start: 2020-11-23 — End: ?
  Filled 2020-12-03: qty 30, 30d supply, fill #0

## 2020-11-23 NOTE — Unmapped (Signed)
I called [name] to advice him not to come to the hospital today for the procedure (bronchoscopy).     He will be waiting for a call from our clinic to schedule pre-procedural CT chest and will be given time and date for the procedure.    He understood and will be waiting. All questions answered.    Hulen Luster, MD  Interventional Pulmonology Fellow  Division of Pulmonary Diseases and Critical Care Medicine  Pager: [phone]

## 2020-11-23 NOTE — Unmapped (Signed)
Pulmonology On-Call Telephone Note    Returned phone call placed to hospital operator.  Spoke with Adam Keith.    In brief, Adam [name] who is scheduled for a robotic bronchoscopy on 08/22. He was unsure as to the time of his procedure; he was informed that it was at 1400 and that he should arrive at least a couple of hours ahead of time to ensure adequate preparation time.     All questions answered. This note will be routed to their primary pulmonologist.    Hilda Blades, MD  [facility] Pulmonary & Critical Care Fellow, PGY-4  11/22/2020

## 2020-11-23 NOTE — Unmapped (Signed)
Spoke with Ms. Pietsch to cancel Mr. Avraj Lindroth. Will call back later today to reschedule.

## 2020-11-24 ENCOUNTER — Telehealth: Admit: 2020-11-24 | Discharge: 2020-11-25 | Payer: MEDICAID

## 2020-11-24 NOTE — Unmapped (Signed)
[facility]  Psychiatry   Established Patient E&M Service - Outpatient       Assessment:    Adam Keith presents for follow-up evaluation. Patient continues to struggle with depressive and anxiety symptoms. Significant frustration expressed over recent hospitalizations and ICU stay. Patient sleeping well now. Given how sick he has been over the past month, it's difficulty to assess if his increased nortriptyline dose of 75 mg is providing any benefit. Patient is interested in increasing his nortriptyline dose to 100 mg, but we are going to hold off due to his current Linezolid therapy. He continues to use clonazepam 0.5 mg in the evenings. Encouraged patient to reconnect with local therapist, which he has agreed to do, but he has been too sick to engage with her at this time.    Identifying Information:  Adam [name] with a history of PH+ B cell ALL, major depressive disorder, PTSD, anxiety, and insomnia.??He has a long struggle with intermittent depressive episodes with one suicide attempt at age 59.??He was recently hospitalized for chemotherapy and was started on nortriptyline and clonazepam to manage his symptoms. Recuperating from DRESS and currently on a prednisone taper.    Risk Assessment:  An assessment of suicide and violence risk factors was performed as part of this evaluation and is not significantly changed from the last visit.   While future psychiatric events cannot be accurately predicted, the patient does not currently require acute inpatient psychiatric care and does not currently meet [facility] involuntary commitment criteria.      Plan:    Problem 1:??Major Depressive Disorder  Status of problem:??chronic with moderate exacerbation  Interventions:??  ?? Continue nortriptyline 75 mg at bedtime while patient continues his linezolid therapy. Plan to increase dose to 100 mg when appropriate.  ?? Monitor nortriptyline level given elevated creatinine levels. Will add on lab to his next lab appointment in a few weeks. (Lab draw ordered for 8/18 was not drawn. Will request level at 9/12 oncology appointment.)  ?? Continue psychotherapy with local therapist. Encouraged patient to reconnect, which he has agreed to do. Recently, patient has been too sick to engage with his therapist.  ??  Problem 2:??Anxiety  Status of problem:??chronic with mild to moderate exacerbation  Interventions:??  ?? See nortriptyline plan above  ?? OK to continue clonazepam??0.5??mg tablets BID PRN. Patient states that he has only been using this medication at bedtime.    Problem 3:??Insomnia  Status of problem:?? chronic and stable  Interventions:??  ?? See nortriptyline and clonazepam plans above  ?? Education re: sleep hygiene      Psychotherapy provided:  No billable psychotherapy service provided.    Patient has been given this writer's contact information as well as the [facility] Psychiatry urgent line number. The patient has been instructed to call 911 for emergencies.      Subjective:    Chief complaint:  Follow-up psychiatric evaluation for depression and anxiety    Interval History:   Patient struggling with infection, which has led to hospitalizations and a stay in the ICU. Patient experiencing pain from his infection that wakes him up on occasion, but states that he is sleeping well most nights. He continues to struggle with anxiety and depressive symptoms, but given the severity of his pneumonia over the past month, it is difficult to assess if he is getting any benefit from the increase in nortriptyline. Patient's wife concerned that he likely did not receive all of  his nortriptyline doses while hospitalized. Patient experiencing extreme fatigue at this time along with frustration over his struggle with pneumonia.    Prior Psychiatric Medication Trials: bupropion (could not tolerate), duloxetine, mirtazapine (given during recent hospitalization), lorazepam      Psychiatric Review of Systems  ?? Depressive Symptoms: anhedonia, depressed mood, difficulty concentrating, fatigue, hopelessness and insomnia  ?? Manic Symptoms: N/A  ?? Psychosis Symptoms: N/A  ?? Anxiety Symptoms: excessive anxiety and worry, difficulty controlling worry, restlessness, easily fatigued, difficulty concentrating, irritability, muscle tension, difficulty staying asleep, restless, unsatisfying sleep   ?? Panic Symptoms: denies  ?? PTSD Symptoms: no recent issues  ?? Sleep disturbance: frequent night time awakening and non-restful sleep   ?? Appetite: Other: no change.  ?? Exercise: Considering a daily exercise routine.        Objective:      Mental Status Exam:  Appearance:    Appears stated age, Well nourished and Disheveled   Motor:   No abnormal movements   Speech/Language:    Normal rate, volume, tone, fluency   Mood:   Anxious   Affect:   Anxious and Cooperative   Thought process and Associations:   Logical, linear, clear, coherent, goal directed   Abnormal/psychotic thought content:     Denies SI, HI, self harm, delusions, obsessions, paranoid ideation, or ideas of reference   Perceptual disturbances:     Denies auditory and visual hallucinations, behavior not concerning for response to internal stimuli     Other:   insight and judgment intact     I personally spent 55 minutes face-to-face and non-face-to-face in the care of this patient, which includes all pre, intra, and post visit time on the date of service.    Visit was completed by video (or phone) and the appropriate disclaimer has been included below.    The patient reports they are currently: at home. I spent 35 minutes on the real-time audio and video with the patient on the date of service. I spent an additional 20 minutes on pre- and post-visit activities on the date of service.     The patient was physically located in [place] or a state in which I am permitted to provide care. The patient and/or parent/guardian understood that s/he may incur co-pays and cost sharing, and agreed to the telemedicine visit. The visit was reasonable and appropriate under the circumstances given the patient's presentation at the time.    The patient and/or parent/guardian has been advised of the potential risks and limitations of this mode of treatment (including, but not limited to, the absence of in-person examination) and has agreed to be treated using telemedicine. The patient's/patient's family's questions regarding telemedicine have been answered.     If the visit was completed in an ambulatory setting, the patient and/or parent/guardian has also been advised to contact their provider???s office for worsening conditions, and seek emergency medical treatment and/or call 911 if the patient deems either necessary.      Sheran Lawless, PMHNP  11/24/2020

## 2020-11-25 NOTE — Unmapped (Addendum)
It was a pleasure meeting with you. Given that you are continuing your antibiotics (specifically Linezolid), we will hold off on increasing your nortriptyline dose. We will plan for the nortriptyline lab draw to occur at your next oncology appointment scheduled for 9/12.     Please feel free to reach out with any questions or concerns.    Best,  [name]      Comprehensive Cancer Support Program (CCSP) - Psychiatry Outpatient Clinic   After Visit Summary    It was a pleasure to see you today in the [facility]???s Comprehensive Cancer Support Program (CCSP). The CCSP is a multidisciplinary program dedicated to helping patients, caregivers, and families with cancer treatment, recovery and survivorship.      To schedule, cancel, or change your appointment:  Please call the [facility] schedulers at [phone], Monday through Friday 8AM - 5PM.  Someone will return your call within 24 hours.      If you have a question about your medicines or you need to contact your provider:  Please call the CCSP program coordinator, Myrene Galas, at [phone].     For after hours urgent issues, you may call [phone] or call the I need to talk line at [phone]-TALK ([phone]) anytime 24/7.    CCSP Patient and Family Resource Center: [phone].    CCSP Website:  [email or web address]    For prescription refills, please allow at least 24 hours (during business hours, M-F) for providers to call in refills to your pharmacy. We are generally unable to accommodate same-day requests for refills.     If you are taking any controlled substances (such as anxiety or sleep medications), you must use them as the directions say to use them. We generally do not provide early refills over the phone without clear reason, and it would be inappropriate to obtain the medications from other doctors. We routinely use the [place] controlled substance database to monitor prescription drug use.

## 2020-11-26 DIAGNOSIS — C9101 Acute lymphoblastic leukemia, in remission: Principal | ICD-10-CM

## 2020-11-26 MED ORDER — PONATINIB 30 MG TABLET
ORAL_TABLET | Freq: Every day | ORAL | 5 refills | 30 days | Status: CP
Start: 2020-11-26 — End: ?
  Filled 2020-12-03: qty 30, 30d supply, fill #0

## 2020-11-26 NOTE — Unmapped (Deleted)
[facility] Clinical Assessment & Refill Coordination Note    Adam Keith, DOB: 1980-11-01  Phone: [phone] (home)     All above HIPAA information was verified with {Blank:19197::patient.,patient's caregiver, ***.,patient's family member, ***.}     Was a Nurse, learning disability used for this call? {Blank single:19197::Yes, ***. Patient language is appropriate in WAM,No}    Specialty Medication(s):   Hematology/Oncology: Iclusig     Current Outpatient Medications   Medication Sig Dispense Refill   ??? acetaminophen (TYLENOL) 325 MG tablet Take 650 mg by mouth every six (6) hours as needed for pain.      ??? amLODIPine (NORVASC) 10 MG tablet Take 1 tablet (10 mg total) by mouth daily. 30 tablet 0   ??? aspirin 81 MG chewable tablet Chew 1 tablet (81 mg total) daily. 36 tablet 11   ??? carvediloL (COREG) 12.5 MG tablet TAKE 1 TABLET BY MOUTH TWO TIMES A DAY. 180 tablet 1   ??? clonazePAM (KLONOPIN) 0.5 MG tablet Take 1 tablet (0.5 mg total) by mouth two (2) times a day as needed for anxiety or sleep. 60 tablet 1   ??? gabapentin (NEURONTIN) 300 MG capsule Take 1 capsule (300 mg total) by mouth Three (3) times a day. 270 capsule 6   ??? hydrOXYzine (ATARAX) 25 MG tablet Take 1 tablet (25 mg total) by mouth every six (6) hours as needed. 60 tablet 1   ??? isavuconazonium sulfate (CRESEMBA) 186 mg cap capsule Take 2 capsules (372 mg total) by mouth daily. 56 capsule 2   ??? linezolid (ZYVOX) 600 mg tablet Take 1 tablet (600 mg total) by mouth Two (2) times a day for 7 days. 14 tablet 0   ??? nortriptyline (PAMELOR) 75 MG capsule Take 1 capsule (75 mg total) by mouth nightly. 30 capsule 11   ??? OXYCONTIN 15 mg 12 hr crush resistant ER/CR tablet TAKE 1 TABLET (15 MG TOTAL) BY MOUTH TWO (2) TIMES A DAY FOR 28 DAYS.     ??? PONATinib (ICLUSIG) 30 mg tablet Take 1 tablet (30 mg total) by mouth daily. Swallow tablets whole. Do not crush, break, cut or chew tablets. 30 tablet 5   ??? predniSONE (DELTASONE) 20 MG tablet Take 1 tablet (20 mg total) by mouth daily for 21 days. 21 tablet 0   ??? sulfamethoxazole-trimethoprim (BACTRIM DS) 800-160 mg per tablet Take 1 tablet (160 mg of trimethoprim total) by mouth 2 times a day on Saturday, Sunday. For prophylaxis while on chemo. 48 tablet 3   ??? tedizolid 200 mg Tab Take 1 tablet (200 mg total) by mouth daily. 30 tablet 5   ??? valACYclovir (VALTREX) 500 MG tablet Take 1 tablet (500 mg total) by mouth daily. 90 tablet 11     No current facility-administered medications for this visit.        Changes to medications: {Blank:19197::Ferris reports starting the following medications: ***,Trysten Reports stopping the following medications: ***,Isley reports no changes at this time.}    Allergies   Allergen Reactions   ??? Bupropion Hcl Other (See Comments)     Per patient out of touch with reality, suicidal, homicidal   ??? Cefepime Rash     DRESS   ??? Ceftaroline Fosamil Rash and Other (See Comments)     Rash X 2 02/2020, suspected DRESS 06/2020   ??? 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 Onion Anaphylaxis   ??? Vancomycin Analogues  Hearing loss with Lasix  Other reaction(s): Other (See Comments)  Hearing loss  lasix   ??? Bismuth Subsalicylate Nausea And Vomiting   ??? Furosemide      With Vancomycin caused hearing loss  Other reaction(s): Other (See Comments)  With Vancomycin caused hearing loss       Changes to allergies: {Blank:19197::Yes: ***,No}    SPECIALTY MEDICATION ADHERENCE     *** *** {Blank:19197::mg,mg/ml,***}: *** days of medicine on hand   *** *** {Blank:19197::mg,mg/ml,***}: *** days of medicine on hand   *** *** {Blank:19197::mg,mg/ml,***}: *** days of medicine on hand   *** *** {Blank:19197::mg,mg/ml,***}: *** days of medicine on hand   *** *** {Blank:19197::mg,mg/ml,***}: *** days of medicine on hand          Specialty medication(s) dose(s) confirmed: {Blank:19197::Regimen is correct and unchanged.,Patient reports changes to the regimen as follows: ***}     Are there any concerns with adherence? {Blank:19197::Yes: ***,No}    Adherence counseling provided? {Blank:19197::Yes: ***,Not needed}    CLINICAL MANAGEMENT AND INTERVENTION      Clinical Benefit Assessment:    Do you feel the medicine is effective or helping your condition? {Blank:19197::Yes,No,Patient declined to answer}    Clinical Benefit counseling provided? {Blank:19197::Not needed,Reasonable expectations discussed: ***,Labs from *** show evidence of clinical benefit,Progress note from *** shows evidence of clinical benefit,consulted provider regarding clinical benefit concerns,***}    Adverse Effects Assessment:    Are you experiencing any side effects? {Blank:19197::Yes, patient reports experiencing ***. Side effect counseling provided: ***,No}    Are you experiencing difficulty administering your medicine? {Blank:19197::Yes, patient reports ***. Medication administration counseling provided: ***,No}    Quality of Life Assessment:    Quality of Life    Rheumatology  Oncology  Dermatology  Cystic Fibrosis          {DiseaseSpecificQOL:73897}    Have you discussed this with your provider? {Blank:19197::Not needed,Yes,No - pharmacist will consult provider}    Acute Infection Status:    Acute infections noted within Epic:  No active infections  Patient reported infection: {Blank single:19197::None,***- patient reported to provider,***- pharmacy reported to provider}    Therapy Appropriateness:    Is therapy appropriate? {Blank:19197::Yes, therapy is appropriate and should be continued,Pharmacist will consult provider}    DISEASE/MEDICATION-SPECIFIC INFORMATION      {clinicspecificinstructions:59274}    PATIENT SPECIFIC NEEDS     - Does the patient have any physical, cognitive, or cultural barriers? {Blank single:19197::No,Yes - ***}    - Is the patient high risk? {sschighriskpts:78327}    - Does the patient require a Care Management Plan? {Blank single:19197::No,Yes}     - Does the patient require physician intervention or other additional services (i.e. nutrition, smoking cessation, social work)? {Blank single:19197::No,Yes - ***}      SHIPPING     Specialty Medication(s) to be Shipped:   {specpharm:59087}    Other medication(s) to be shipped: {Blank:19197::No additional medications requested for fill at this time}     Changes to insurance: {Blank:19197::Yes: ***,No}    Delivery Scheduled: {Blank:19197::Yes, Expected medication delivery date: ***.,Yes, Expected medication delivery date: ***.  However, Rx request for refills was sent to the provider as there are none remaining.,Patient declined refill at this time due to ***.,No, cannot schedule delivery at this time as there are outstanding items that need addressed.  This note has been handed off to the provider for follow up.,Due to patient insurance changes, unable to fill at [facility], please route Rx to *** specialty pharmacy}  Medication will be delivered via {Blank:19197::UPS,Next Day Courier,Same Day Courier,Clinic Courier - *** clinic,***} to the confirmed {Blank:19197::prescription,temporary} address in [facility].    The patient will receive a drug information handout for each medication shipped and additional FDA Medication Guides as required.  Verified that patient has previously received a Conservation officer, historic buildings and a Surveyor, mining.    The patient or caregiver noted above participated in the development of this care plan and knows that they can request review of or adjustments to the care plan at any time.      All of the patient's questions and concerns have been addressed.    Nonie Hoyer   [facility] Specialty Pharmacist

## 2020-11-26 NOTE — Unmapped (Deleted)
[facility]   Patient Onboarding/Medication Counseling    Adam 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  I am speaking to {Blank:19197::the patient,the patient's caregiver, ***,the patient's family member, ***,***}.    Was a Nurse, learning disability used for this call? {Blank single:19197::Yes, ***. Patient language is appropriate in WAM,No}    Verified patient's date of birth / HIPAA.    Specialty medication(s) to be sent: Infectious Disease: Cresemba      Non-specialty medications/supplies to be sent: ***      Medications not needed at this time: ***           Cresemba (Isavuconazonium Sulfate)    Medication & Administration     Dosage:   Take 2 capsules (372 mg total) by mouth every morning.    Administration:   ??? Comes in a blister card packing. Do not remove from the blister pack until you are ready to take the medication.  Take the medication right after opening the blister pack.  Do not store the removed drug for future use.     ??? Be sure to only open the capsule pocket. Do not open the pocket that contains the desiccant (protects the capsule from moisture).  ??? May be administered with or without food.  ??? Swallow whole.  Do not chew, break, open or dissolve.    Adherence/Missed dose instructions: Take a missed dose as soon as you think about.  If it is close to the time for the next dose, skip the missed dose and go back to your normal time. Do not take 2 doses at the same time or extra doses.    Goals of Therapy     ??? To prevent and or treat fungal infections    Side Effects & Monitoring Parameters     Commonly reported side effects  ??? Loss of strength and energy  ??? Nausea, vomiting  ??? Diarrhea  ??? Constipation  ??? Cough  ??? Abdominal pain  ??? Back pain  ??? Headache   ??? Lack of appetite   ??? Trouble sleeping  ??? Anxiety    The following side effects should be reported to the provider:  ??? Signs of liver problems (dark urine, abdominal pain, light-colored stools, vomiting, yellow skin or eyes)  ??? Signs of fluid and electrolyte problems (mood changes, confusion, muscle pain or weakness, abnormal/fast heartbeat, severe dizziness, passing out)  ??? Signs of kidney problems (inability to pass urine, blood in the urine, change in amount of urine passed, weight gain)  ??? Chest pain  ??? Confusion  ??? Shortness of breath  ??? Swelling in arms or legs  ??? Severe dizziness, passing out  ??? Signs Stevens-Johnson syndrome/toxic epidermal necrolysis like red, swollen, blistered, or peeling skin (with or without fever); red or irritated eyes; or sores in mouth, throat, nose, or eyes.)  ??? Signs of anaphylaxis (wheezing, chest tightness, swelling of face, lips, tongue or throat)    Monitoring Parameters: Hypersensitivity reactions with initial doses, liver function tests at baseline and periodically during therapy. Monitor adherence.    Contraindications, Warnings, & Precautions     Contraindications:  ??? Hypersensitivity to isavuconazonium sulfate or any component of the formulation  ??? Concurrent use of strong CYP3A4 inhibitors   ??? Concurrent use of strong CYP3A4 inducers  ??? Familial short QT syndrome    Warnings & Precautions  ??? Hepatic effects: Severe reactions including hepatic failure [including fatalities],  hepatitis, and cholestasis) have been reported in patients with serious underlying medical conditions. Elevations in AST, ALT, alkaline phosphatase and total bilirubin have also been reported.  ??? Hypersensitivity: Serious hypersensitivity and severe skin reactions have been reported with other azole antifungal agents.  ??? Hepatic impairment: Use with caution and monitor for adverse effects in patients with severe hepatic impairment (Child-Pugh class C).  ??? Reproductive Considerations: Evaluate pregnancy status prior to use in females of reproductive potential. Females of reproductive potential should use effective contraception during therapy and for 28 days after the last isavuconazonium sulfate dose. Based on data from animal reproduction studies, in utero exposure to isavuconazonium sulfate may cause fetal harm.  ??? Breastfeeding Considerations: It is not known if isavuconazonium sulfate is present breast milk. The manufacturer recommends breastfeeding be discontinued during maternal isavuconazonium sulfate therapy.    Drug/Food Interactions     ??? Medication list reviewed in Epic. The patient was instructed to inform the care team before taking any new medications or supplements. {Blank single:19197::No drug interactions identified,***}.   ??? Moderate inhibitor of CYP3A4    Storage, Handling Precautions, & Disposal     ??? Store at room temperature in the original container.  ??? Store in a dry place. Do no store in a bathroom.   ??? Keep the lid tightly closed. Keep out of the reach of children and pets.  ??? Do not flush down a toilet or pour down a drain unless instructed to do so.  Check with your local police department or fire station about drug take-back programs in your area.        Current Medications (including OTC/herbals), Comorbidities and Allergies     Current Outpatient Medications   Medication Sig Dispense Refill   ??? acetaminophen (TYLENOL) 325 MG tablet Take 650 mg by mouth every six (6) hours as needed for pain.      ??? amLODIPine (NORVASC) 10 MG tablet Take 1 tablet (10 mg total) by mouth daily. 30 tablet 0   ??? aspirin 81 MG chewable tablet Chew 1 tablet (81 mg total) daily. 36 tablet 11   ??? carvediloL (COREG) 12.5 MG tablet TAKE 1 TABLET BY MOUTH TWO TIMES A DAY. 180 tablet 1   ??? clonazePAM (KLONOPIN) 0.5 MG tablet Take 1 tablet (0.5 mg total) by mouth two (2) times a day as needed for anxiety or sleep. 60 tablet 1   ??? gabapentin (NEURONTIN) 300 MG capsule Take 1 capsule (300 mg total) by mouth Three (3) times a day. 270 capsule 6   ??? hydrOXYzine (ATARAX) 25 MG tablet Take 1 tablet (25 mg total) by mouth every six (6) hours as needed. 60 tablet 1   ??? isavuconazonium sulfate (CRESEMBA) 186 mg cap capsule Take 2 capsules (372 mg total) by mouth daily. 56 capsule 2   ??? linezolid (ZYVOX) 600 mg tablet Take 1 tablet (600 mg total) by mouth Two (2) times a day for 7 days. 14 tablet 0   ??? nortriptyline (PAMELOR) 75 MG capsule Take 1 capsule (75 mg total) by mouth nightly. 30 capsule 11   ??? OXYCONTIN 15 mg 12 hr crush resistant ER/CR tablet TAKE 1 TABLET (15 MG TOTAL) BY MOUTH TWO (2) TIMES A DAY FOR 28 DAYS.     ??? PONATinib (ICLUSIG) 30 mg tablet Take 1 tablet (30 mg total) by mouth daily. Swallow tablets whole. Do not crush, break, cut or chew tablets. 30 tablet 5   ??? predniSONE (DELTASONE) 20 MG tablet Take 1 tablet (  20 mg total) by mouth daily for 21 days. 21 tablet 0   ??? sulfamethoxazole-trimethoprim (BACTRIM DS) 800-160 mg per tablet Take 1 tablet (160 mg of trimethoprim total) by mouth 2 times a day on Saturday, Sunday. For prophylaxis while on chemo. 48 tablet 3   ??? tedizolid 200 mg Tab Take 1 tablet (200 mg total) by mouth daily. 30 tablet 5   ??? valACYclovir (VALTREX) 500 MG tablet Take 1 tablet (500 mg total) by mouth daily. 90 tablet 11     No current facility-administered medications for this visit.       Allergies   Allergen Reactions   ??? Bupropion Hcl Other (See Comments)     Per patient out of touch with reality, suicidal, homicidal   ??? Cefepime Rash     DRESS   ??? Ceftaroline Fosamil Rash and Other (See Comments)     Rash X 2 02/2020, suspected DRESS 06/2020   ??? 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 Onion Anaphylaxis   ??? Vancomycin Analogues      Hearing loss with Lasix  Other reaction(s): Other (See Comments)  Hearing loss  lasix   ??? Bismuth Subsalicylate Nausea And Vomiting   ??? Furosemide      With Vancomycin caused hearing loss  Other reaction(s): Other (See Comments)  With Vancomycin caused hearing loss       Patient Active Problem List   Diagnosis   ??? Tobacco use disorder   ??? AKI (acute kidney injury) (CMS-HCC)   ??? Transaminitis   ??? 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 Hyperphosphatemia   ??? MRSA bacteremia   ??? Septic shock due to Staphylococcus aureus (CMS-HCC)   ??? Hyperkalemia   ??? Hiccups   ??? Hypernatremia   ??? Red blood cell antibody positive   ??? Hypokalemia   ??? Hypomagnesemia   ??? COVID-19   ??? Dyspnea   ??? Moderate episode of recurrent major depressive disorder (CMS-HCC)   ??? PTSD (post-traumatic stress disorder)   ??? Anxiety   ??? Insomnia   ??? Fever   ??? Immunocompromised (CMS-HCC)   ??? Hypogammaglobulinemia (CMS-HCC)   ??? ALL (acute lymphoblastic leukemia) (CMS-HCC)   ??? Respiratory failure with hypoxia (CMS-HCC)   ??? Pneumonia   ??? Chronic pain   ??? Acute kidney injury superimposed on CKD (CMS-HCC)   ??? COPD (chronic obstructive pulmonary disease) (CMS-HCC)   ??? Acute lymphoblastic leukemia in remission (CMS-HCC)       Reviewed and up to date in Epic.    Appropriateness of Therapy     Acute infections noted within Epic:  No active infections  Patient reported infection: {Blank single:19197::None,***- patient reported to provider,***- pharmacy reported to provider}    Is medication and dose appropriate based on diagnosis and infection status? {Blank single:19197::Yes,No - evidence provided by prescriber in *** note}    Prescription has been clinically reviewed: {Blank single:19197::Yes,***}      Baseline Quality of Life Assessment      {DiseaseSpecificQOL:73897}    Financial Information     Medication Assistance provided: {sscmedassist:65303}    Anticipated copay of $*** reviewed with patient. Verified delivery address.    Delivery Information     Scheduled delivery date: ***    Expected start date: ***    Medication will be delivered via {Blank:19197::UPS,Next Day Courier,Same Day Courier,Clinic Courier - *** clinic,***} to the {Blank:19197::prescription,temporary} address in Epic WAM.  This shipment {  Blank single:19197::will,will not} require a signature.      Explained the services we provide at [facility] and that each month we would call to set up refills.  Stressed importance of returning phone calls so that we could ensure they receive their medications in time each month.  Informed patient that we should be setting up refills 7-10 days prior to when they will run out of medication.  A pharmacist will reach out to perform a clinical assessment periodically.  Informed patient that a welcome packet, containing information about our pharmacy and other support services, a Notice of Privacy Practices, and a drug information handout will be sent.      The patient or caregiver noted above participated in the development of this care plan and knows that they can request review of or adjustments to the care plan at any time.      Patient or caregiver verbalized understanding of the above information as well as how to contact the pharmacy at [phone] option 4 with any questions/concerns.  The pharmacy is open Monday through Friday 8:30am-4:30pm.  A pharmacist is available 24/7 via pager to answer any clinical questions they may have.    Patient Specific Needs     - Does the patient have any physical, cognitive, or cultural barriers? {Blank single:19197::No,Yes - ***}    - Does the patient have adequate living arrangements? (i.e. the ability to store and take their medication appropriately) {Blank single:19197::Yes,No - ***}    - Did you identify any home environmental safety or security hazards? {Blank single:19197::No,Yes - ***}    - Patient prefers to have medications discussed with  {Blank single:19197::Patient,Family Member,Caregiver,Other}     - Is the patient or caregiver able to read and understand education materials at a high school level or above? {Blank single:19197::No,Yes}    - Patient's primary language is  {Blank single:19197::English,Spanish,***}     - Is the patient high risk? {sschighriskpts:78327}    - Does the patient require physician intervention or other additional services (i.e. dietary/nutrition, smoking cessation, social work)? {Blank single:19197::No,Yes - ***}      Nonie Hoyer  [facility] Specialty Pharmacist

## 2020-11-26 NOTE — Unmapped (Signed)
[facility]   Patient Onboarding/Medication Counseling    Adam 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 who I am counseling today on initiation of therapy.  I am speaking to the patient.    Was a Nurse, learning disability used for this call? No    Verified patient's date of birth / HIPAA.    Specialty medication(s) to be sent: Infectious Disease: Sivextro    Non-specialty medications/supplies to be sent: N/A    Medications not needed at this time: N/A     Sivextro (Tedizolid)    Medication & Administration     Dosage:  Take 1 tablet (200 mg) by mouth daily.    Administration:   ??? Administer orally at the same time each day.   ??? May be administered with or without food.  ??? Do not stop taking this medication even if you feel well.      Adherence/Missed dose instructions: Take a missed dose as soon as you think about.  If it is less than 8 hours until the next dose, skip the missed dose and go back to your normal time. Do not take 2 doses at the same time or extra doses.    Goals of Therapy     ??? To prevent and or treat infections    Side Effects & Monitoring Parameters     Commonly reported side effects  ??? Diarrhea  ??? Dizziness  ??? Headache  ??? Nausea  ??? Vomiting    The following side effects should be reported to the provider:  ??? Signs of c-diff associated diarrhea (abdominal pain or cramps, severe diarrhea or watery stools, or bloody stools)  ??? Signs of anaphylaxis (wheezing, chest tightness, swelling of face, lips, tongue or throat)    Monitoring Parameters: Baseline complete blood count (CBC) with differential.  Monitor for improvement in infection, new opportunistic infections, development of severe diarrhea. Monitor baseline CBC. Monitor adherence.     Contraindications, Warnings, & Precautions     ??? Superinfection: Prolonged use may result in fungal or bacterial superinfection, including C. difficile-associated diarrhea and pseudomembranous colitis.    ??? Neutropenia: Not recommended for use in patients with neutrophil counts <1000 cells/mm3. Alternative therapies should be considered when treating patients with neutropenia and acute bacterial skin and skin structure infections.    ??? Reproductive considerations: Adverse events were observed in animal reproduction studies.  ??? Breastfeeding considerations: It is not known if tedizolid is present in breast milk. According to the manufacturer, the decision to breastfeed during therapy should consider the risk of infant exposure, the benefits of breastfeeding to the infant, and benefits of treatment to the mother.    Drug/Food Interactions     ??? Medication list reviewed in Epic. The patient was instructed to inform the care team before taking any new medications or supplements. No drug interactions identified.     Storage, Handling Precautions, & Disposal   ??? Store at room temperature in a dry place.  Do not store in a bathroom.   ??? Keep the lid tightly closed. Keep out of the reach of children and pets.  ??? Do not flush down a toilet or pour down a drain unless instructed to do so.     Current Medications (including OTC/herbals), Comorbidities and Allergies     Current Outpatient Medications   Medication Sig Dispense Refill   ??? acetaminophen (TYLENOL) 325 MG tablet Take 650 mg by mouth every six (6) hours as needed for pain.      ???  amLODIPine (NORVASC) 10 MG tablet Take 1 tablet (10 mg total) by mouth daily. 30 tablet 0   ??? aspirin 81 MG chewable tablet Chew 1 tablet (81 mg total) daily. 36 tablet 11   ??? carvediloL (COREG) 12.5 MG tablet TAKE 1 TABLET BY MOUTH TWO TIMES A DAY. 180 tablet 1   ??? clonazePAM (KLONOPIN) 0.5 MG tablet Take 1 tablet (0.5 mg total) by mouth two (2) times a day as needed for anxiety or sleep. 60 tablet 1   ??? gabapentin (NEURONTIN) 300 MG capsule Take 1 capsule (300 mg total) by mouth Three (3) times a day. 270 capsule 6   ??? hydrOXYzine (ATARAX) 25 MG tablet Take 1 tablet (25 mg total) by mouth every six (6) hours as needed. 60 tablet 1   ??? isavuconazonium sulfate (CRESEMBA) 186 mg cap capsule Take 2 capsules (372 mg total) by mouth daily. 56 capsule 2   ??? linezolid (ZYVOX) 600 mg tablet Take 1 tablet (600 mg total) by mouth Two (2) times a day for 7 days. 14 tablet 0   ??? nortriptyline (PAMELOR) 75 MG capsule Take 1 capsule (75 mg total) by mouth nightly. 30 capsule 11   ??? OXYCONTIN 15 mg 12 hr crush resistant ER/CR tablet TAKE 1 TABLET (15 MG TOTAL) BY MOUTH TWO (2) TIMES A DAY FOR 28 DAYS.     ??? PONATinib (ICLUSIG) 30 mg tablet Take 1 tablet (30 mg total) by mouth daily. Swallow tablets whole. Do not crush, break, cut or chew tablets. 30 tablet 5   ??? predniSONE (DELTASONE) 20 MG tablet Take 1 tablet (20 mg total) by mouth daily for 21 days. 21 tablet 0   ??? sulfamethoxazole-trimethoprim (BACTRIM DS) 800-160 mg per tablet Take 1 tablet (160 mg of trimethoprim total) by mouth 2 times a day on Saturday, Sunday. For prophylaxis while on chemo. 48 tablet 3   ??? tedizolid 200 mg Tab Take 1 tablet (200 mg total) by mouth daily. 30 tablet 5   ??? valACYclovir (VALTREX) 500 MG tablet Take 1 tablet (500 mg total) by mouth daily. 90 tablet 11     No current facility-administered medications for this visit.       Allergies   Allergen Reactions   ??? Bupropion Hcl Other (See Comments)     Per patient out of touch with reality, suicidal, homicidal   ??? Cefepime Rash     DRESS   ??? Ceftaroline Fosamil Rash and Other (See Comments)     Rash X 2 02/2020, suspected DRESS 06/2020   ??? 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 Onion Anaphylaxis   ??? Vancomycin Analogues      Hearing loss with Lasix  Other reaction(s): Other (See Comments)  Hearing loss  lasix   ??? Bismuth Subsalicylate Nausea And Vomiting   ??? Furosemide      With Vancomycin caused hearing loss  Other reaction(s): Other (See Comments)  With Vancomycin caused hearing loss       Patient Active Problem List   Diagnosis   ??? Tobacco use disorder   ??? AKI (acute kidney injury) (CMS-HCC)   ??? Transaminitis   ??? 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 Hyperphosphatemia   ??? MRSA bacteremia   ??? Septic shock due to Staphylococcus aureus (CMS-HCC)   ??? Hyperkalemia   ??? Hiccups   ??? Hypernatremia   ??? Red blood cell antibody positive   ???  Hypokalemia   ??? Hypomagnesemia   ??? COVID-19   ??? Dyspnea   ??? Moderate episode of recurrent major depressive disorder (CMS-HCC)   ??? PTSD (post-traumatic stress disorder)   ??? Anxiety   ??? Insomnia   ??? Fever   ??? Immunocompromised (CMS-HCC)   ??? Hypogammaglobulinemia (CMS-HCC)   ??? ALL (acute lymphoblastic leukemia) (CMS-HCC)   ??? Respiratory failure with hypoxia (CMS-HCC)   ??? Pneumonia   ??? Chronic pain   ??? Acute kidney injury superimposed on CKD (CMS-HCC)   ??? COPD (chronic obstructive pulmonary disease) (CMS-HCC)   ??? Acute lymphoblastic leukemia in remission (CMS-HCC)       Reviewed and up to date in Epic.    Appropriateness of Therapy     Acute infections noted within Epic:  No active infections  Patient reported infection: None    Is medication and dose appropriate based on diagnosis and infection status? Yes    Prescription has been clinically reviewed: Yes      Baseline Quality of Life Assessment      How many days over the past month did your ALL  keep you from your normal activities? For example, brushing your teeth or getting up in the morning. 0    Financial Information     Medication Assistance provided: None Required    Anticipated copay of $4/30 day supply reviewed with patient. Verified delivery address.    Delivery Information     Scheduled delivery date: 12/04/20    Expected start date: 12/04/20    Medication will be delivered via UPS to the prescription address in [facility].  This shipment will not require a signature.      Explained the services we provide at [facility] and that each month we would call to set up refills.  Stressed importance of returning phone calls so that we could ensure they receive their medications in time each month.  Informed patient that we should be setting up refills 7-10 days prior to when they will run out of medication.  A pharmacist will reach out to perform a clinical assessment periodically.  Informed patient that a welcome packet, containing information about our pharmacy and other support services, a Notice of Privacy Practices, and a drug information handout will be sent.      The patient or caregiver noted above participated in the development of this care plan and knows that they can request review of or adjustments to the care plan at any time.      Patient or caregiver verbalized understanding of the above information as well as how to contact the pharmacy at [phone] option 4 with any questions/concerns.  The pharmacy is open Monday through Friday 8:30am-4:30pm.  A pharmacist is available 24/7 via pager to answer any clinical questions they may have.    Patient Specific Needs     - Does the patient have any physical, cognitive, or cultural barriers? No    - Does the patient have adequate living arrangements? (i.e. the ability to store and take their medication appropriately) Yes    - Did you identify any home environmental safety or security hazards? No    - Patient prefers to have medications discussed with  Caregiver     - Is the patient or caregiver able to read and understand education materials at a high school level or above? Yes    - Patient's primary language is  English     - Is the patient high risk?  Yes, patient is taking oral chemotherapy. Appropriateness of therapy as been assessed    - Does the patient require physician intervention or other additional services (i.e. dietary/nutrition, smoking cessation, social work)? No      Adam Keith, PharmD  PGY1 Community-based Pharmacy Resident  [facility]    I reviewed this patient case and all documentation provided by the learner and was readily available for consultation during their interaction with the patient.  I agree with the assessment and plan listed below.    Adam Keith [facility] Specialty Pharmacist

## 2020-11-26 NOTE — Unmapped (Signed)
[facility] Clinical Assessment & Refill Coordination Note    Adam Keith, DOB: [birth year]  Phone: [phone] (home)     All above HIPAA information was verified with patient.     Was a Nurse, learning disability used for this call? No    Specialty Medication(s):   Hematology/Oncology: Iclusig     Current Outpatient Medications   Medication Sig Dispense Refill   ??? acetaminophen (TYLENOL) 325 MG tablet Take 650 mg by mouth every six (6) hours as needed for pain.      ??? amLODIPine (NORVASC) 10 MG tablet Take 1 tablet (10 mg total) by mouth daily. 30 tablet 0   ??? aspirin 81 MG chewable tablet Chew 1 tablet (81 mg total) daily. 36 tablet 11   ??? carvediloL (COREG) 12.5 MG tablet TAKE 1 TABLET BY MOUTH TWO TIMES A DAY. 180 tablet 1   ??? clonazePAM (KLONOPIN) 0.5 MG tablet Take 1 tablet (0.5 mg total) by mouth two (2) times a day as needed for anxiety or sleep. 60 tablet 1   ??? gabapentin (NEURONTIN) 300 MG capsule Take 1 capsule (300 mg total) by mouth Three (3) times a day. 270 capsule 6   ??? hydrOXYzine (ATARAX) 25 MG tablet Take 1 tablet (25 mg total) by mouth every six (6) hours as needed. 60 tablet 1   ??? isavuconazonium sulfate (CRESEMBA) 186 mg cap capsule Take 2 capsules (372 mg total) by mouth daily. 56 capsule 2   ??? linezolid (ZYVOX) 600 mg tablet Take 1 tablet (600 mg total) by mouth Two (2) times a day for 7 days. 14 tablet 0   ??? nortriptyline (PAMELOR) 75 MG capsule Take 1 capsule (75 mg total) by mouth nightly. 30 capsule 11   ??? OXYCONTIN 15 mg 12 hr crush resistant ER/CR tablet TAKE 1 TABLET (15 MG TOTAL) BY MOUTH TWO (2) TIMES A DAY FOR 28 DAYS.     ??? PONATinib (ICLUSIG) 30 mg tablet Take 1 tablet (30 mg total) by mouth daily. Swallow tablets whole. Do not crush, break, cut or chew tablets. 30 tablet 5   ??? predniSONE (DELTASONE) 20 MG tablet Take 1 tablet (20 mg total) by mouth daily for 21 days. 21 tablet 0   ??? sulfamethoxazole-trimethoprim (BACTRIM DS) 800-160 mg per tablet Take 1 tablet (160 mg of trimethoprim total) by mouth 2 times a day on Saturday, Sunday. For prophylaxis while on chemo. 48 tablet 3   ??? tedizolid 200 mg Tab Take 1 tablet (200 mg total) by mouth daily. 30 tablet 5   ??? valACYclovir (VALTREX) 500 MG tablet Take 1 tablet (500 mg total) by mouth daily. 90 tablet 11     No current facility-administered medications for this visit.        Changes to medications: Fraser reports no changes at this time.    Allergies   Allergen Reactions   ??? Bupropion Hcl Other (See Comments)     Per patient out of touch with reality, suicidal, homicidal   ??? Cefepime Rash     DRESS   ??? Ceftaroline Fosamil Rash and Other (See Comments)     Rash X 2 02/2020, suspected DRESS 06/2020   ??? 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 Onion Anaphylaxis   ??? Vancomycin Analogues      Hearing loss with Lasix  Other reaction(s): Other (See Comments)  Hearing loss  lasix   ??? Bismuth Subsalicylate Nausea And Vomiting   ???  Furosemide      With Vancomycin caused hearing loss  Other reaction(s): Other (See Comments)  With Vancomycin caused hearing loss       Changes to allergies: No    SPECIALTY MEDICATION ADHERENCE     Iclusig (ponatinib) 30 mg: 14 days of medicine on hand       Medication Adherence    Patient reported X missed doses in the last month: 0  Specialty Medication: Iclusig (ponatinib)  Patient is on additional specialty medications: No  Informant: patient  Confirmed plan for next specialty medication refill: delivery by pharmacy  Refills needed for supportive medications: not needed          Specialty medication(s) dose(s) confirmed: Regimen is correct and unchanged.     Are there any concerns with adherence? No    Adherence counseling provided? Not needed    CLINICAL MANAGEMENT AND INTERVENTION      Clinical Benefit Assessment:    Do you feel the medicine is effective or helping your condition? Yes    Clinical Benefit counseling provided? Not needed    Adverse Effects Assessment:    Are you experiencing any side effects? No    Are you experiencing difficulty administering your medicine? No    Quality of Life Assessment:    Quality of Life    Rheumatology  1. What impact has your specialty medication had on the reduction of your daily pain level?: Some  Oncology  2. On a scale of 1-10, how would you rate your ability to manage side effects associated with your specialty medication? (1=no issues, 10 = unable to take medication due to side effects): 2  Dermatology  Cystic Fibrosis          How many days over the past month did your ALL  keep you from your normal activities? For example, brushing your teeth or getting up in the morning. 0    Have you discussed this with your provider? Not needed    Acute Infection Status:    Acute infections noted within Epic:  No active infections  Patient reported infection: Patient reports recent infection in lungs for which he was treated for during his most recent hospitalization- patient reported to provider    Therapy Appropriateness:    Is therapy appropriate? Yes, therapy is appropriate and should be continued    DISEASE/MEDICATION-SPECIFIC INFORMATION      N/A    PATIENT SPECIFIC NEEDS     - Does the patient have any physical, cognitive, or cultural barriers? No    - Is the patient high risk? Yes, patient is taking oral chemotherapy. Appropriateness of therapy as been assessed    - Does the patient require a Care Management Plan? No     - Does the patient require physician intervention or other additional services (i.e. nutrition, smoking cessation, social work)? No      SHIPPING     Specialty Medication(s) to be Shipped:   Hematology/Oncology: Iclusig    Other medication(s) to be shipped: No additional medications requested for fill at this time     Changes to insurance: No    Delivery Scheduled: Yes, Expected medication delivery date: 12/04/20.     Medication will be delivered via UPS to the confirmed prescription address in [facility].    The patient will receive a drug information handout for each medication shipped and additional FDA Medication Guides as required.  Verified that patient has previously received a Conservation officer, historic buildings and a Technical sales engineer  Practices.    The patient or caregiver noted above participated in the development of this care plan and knows that they can request review of or adjustments to the care plan at any time.      All of the patient's questions and concerns have been addressed.    Nonie Hoyer, PharmD  PGY1 Community-based Pharmacy Resident  [facility]    I reviewed this patient case and all documentation provided by the learner and was readily available for consultation during their interaction with the patient.  I agree with the assessment and plan listed below.    Breck Coons [facility] Specialty Pharmacist

## 2020-11-26 NOTE — Unmapped (Signed)
Error

## 2020-11-27 ENCOUNTER — Ambulatory Visit
Admit: 2020-11-27 | Discharge: 2020-11-28 | Payer: MEDICAID | Attending: Infectious Disease | Primary: Infectious Disease

## 2020-11-27 DIAGNOSIS — C9101 Acute lymphoblastic leukemia, in remission: Principal | ICD-10-CM

## 2020-11-27 DIAGNOSIS — R911 Solitary pulmonary nodule: Principal | ICD-10-CM

## 2020-11-27 NOTE — Unmapped (Addendum)
 IMMUNOCOMPROMISED HOST INFECTIOUS DISEASE PROGRESS NOTE      Assessment/Plan:     Adam [name] with MRSA bacteremia/pneumonia and new pulmonary nodules presenting for a review today.    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  - 4/21 BMBx normocellular bone marrow 50% w/ 1% blasts (MRD)  - Extent of disease/CNS involvement:??rare blast on prior CSF,??intrathecal??ppx??(cytarabine, methotrexate, hydrocortisone)??last??09/18/2020  - Cancer-related complications:??TLS, hyperbilirubinemia, MRSA bacteremia w/ septic emboli/renal failure+dialysis/intubation, C. krusei fungemia  - Prior chemotherapy:??GRAAPPH-2005??induction??with dasatinib??(vincristine, dexamethasone and dasatinib);??C1D1 01/24/2020; difficulty tolerating single-agent dasatinib  - Current chemotherapy:??blinatumomab (anti-CD19/CD3),??C1D1 = 3/18 (symptomatic, titrated up dose and restarted 3/21) + ponatinib (TKI) s/p cycle 3 of 5; plan for one IT treatment per cycle; due for cycle 4 but holding due to infection and complete molecular remission  - 8/26 remains on ponatinib (pred 20mg  for DRESS)  ??  Pertinent Co-morbidities  # COPD/empysema  # DIHS/DRESS ceftaroline 06/12/2020, relapse cefepume 11/02/2020  - ??followed by Dr. Caryn Section, dermatology  - per prior ID notes possible culprits ceftaroline,??posaconazole, dasatinib, sotrovimab  - 8/1 increased pred from 15 -> 20mg  daily with resolution of rash   ????  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  Infection History  Active infections:??  #MRSA RLL PNA complicated by bacteremia, 11/01/2020  #Possible right empyema 11/13/2020, s/p failed thoracentesis 11/20/20  - 7/31 BCx 1/2 Staph aureus  - 7/31 LRCx Staph aureus  - 8/01 TTE without evidence of endocarditis  - 8/3 S/p Port-a-cath removal; cx tip no growth  - 8/12 fever, worsening right chest pain, restarted linezolid  - 8/16 CXR possible right empyema  - 8/17 admitted for eval of R pleuritic chest pain  - 8/17 CT chest New in comparison to the prior study is a small volume pleural effusion with small areas of loculation inferolaterally within the right hemithorax.   - 8/19 Failed thoracocentesis by IP  Rx: 7/31 linezolid/cefepime??-->??linezolid/daptomycin/cefepime -->8/02 linezolid/daptomycin -> 8/9 dapto -> 8/12 linezolid+dapto -> 8/17 linezolid    # New RML opacity 11/18/2020  - 8/17 CT chest New 5.5 cm longest diameter lobular masslike opacity in the lateral segment of the right middle lobe  - 8/19 bronch was cancelled and rescheduled for 9/2  - 8/31 CT chest slightly improved  Rx: 8/17 linezolid+isavuconazole -> 9/1 linezolid  ??  #Possible COVID-18 associated fungal infection,??07/17/20  - 4/16 CT Chest: multifocal somewhat nodular consolidative opacities in b/l lungs that are relatively new, most concerning for infectious PNA (possibly fungal, especially given his recent high dose steroid taper)  - 4/18 bronch with ZOX:WRUE cx + MRSA, OP flora,??RPP negative,??Legionella cx, AFB cx, fungal cx, CMV PCR, PJP DFA, Asp??galactomannan??neg  - 7/28 CT chest -Linear bands of scarring and vague groundglass opacities are all that remain in the various sites of nodular consolidation evident on prior imaging.  Rx: 4/18 cefepime/isavu/linezolid -> 4/19 linezolid??--> 4/21 isavuconazole/linezolid --> 5/10 isavuconazole --> stop 9/1  ??  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  Antimicrobial Intolerance/allergy  Cefepime - DRESS  Ceftaroline -??DRESS  Dapsone - possible??agranulocytosis, per  chart anaphylaxis  Vancomycin - probable ototoxicity (in combination??with furosemide)  Isavuconazole - elevated LFTs in the setting of TKI       RECOMMENDATIONS    Diagnostic   ??? CT Chest 9/1 and Bronchoscopy on 9/2 for further evaluation of RML opacity    Lab monitoring for antimicrobial toxicities  ??? Monitor platelets every alternate weeks while on Linezolid.  ??? We will ask home health to obtain labs on 8/30 while they are there for PICC line dressing change    Treatment  ??? CONT oxazolidinone for total 6 weeks for R pleural effusion - tentative end date 12/25/2020  ??? He will change linezolid to tedizolid when it arrives in the mail on 8/31 (so 9/6 would be a reasonable time to increase his nortriptyline from 75 to 100mg ; we will relay this to Ms. Holt)  ??? He remains on isavuconazole for IFI (presumably resolved on 10/29/2020); we anticipate stopping this after the repeat CT chest on 12/03/2020 (I confirmed with Dr. Fredderick Phenix that no TKI adjustment was needed)  ??? Continue valtrex prophylaxis    Addendum 12/02/2020  I reviewed the CT scan report on 8/31 (I was not able to get the image to load this evening): overall slightly improved.   I defer the ultimate decision to Dr. Spero Curb but I think it is ok from my perspective if [name] prefers to defer the bronch at this time pending further linezolid/tedizolid treatment for another month. I will re-evaluate with repeat CT chest at that time.  I do worry about he elevated wbc so he warrants careful clinical monitoring.   Discontinue isavuconazole.           Patient discussed with Dr. [name]    Attending attestation  I saw and evaluated the patient, participating in the key portions of the service.  I reviewed the resident???s note.  I agree with the resident???s findings and plan.   [name], MD      Subjective     Interval History:    History obtained from:patient and family member.    Patient clinically feels well, no subjective fevers, chills .  No nausea , vomiting, headache  Ongoing Right pleuritic chest pain.  Tolerating Linezolid, no new or worsening peripheral neuropathic symptoms  No new vision changes  No thrombocytopenia  Can be switched to [employer] - Psychiatry recommends to increase Nortryptiline dose     Medications:  Antimicrobials:  Linezolid   isavulconazole  valtrex    Prior/Current immunomodulators:  Ponatinib  Prednisone 20mg     Other medications reviewed.    Objective     BP 147/94  - Pulse 82  - Temp 35.9 ??C (96.6 ??F) (Temporal)  - Resp 16  - Ht 184.7 cm (6' 0.72)  - Wt (!) 107.4 kg (236 lb 12.8 oz)  - SpO2 97%  - BMI 31.49 kg/m??     Physical Exam:  Right arm PICC line c/d/i  Const [x]  vital signs above    [x]  NAD, non-toxic appearance        Eyes [x]  Lids normal bilaterally, conjunctiva anicteric and noninjected OU     [] PERRL  [] EOMI        ENMT [x]  Normal appearance of external nose and ears, no nasal discharge        []  MMM, no lesions on lips or gums [x]  No thrush, leukoplakia, oral lesions  []  Dentition good []  Edentulous []  Dental caries present  []  Hearing normal  []   TMs with good light reflexes bilaterally         Neck [x]  Neck of normal appearance and trachea midline        []  No thyromegaly, nodules, or tenderness   []  Full neck ROM        Lymph []  No LAD in neck     []  No LAD in supraclavicular area     []  No LAD in axillae   []  No LAD in epitrochlear chains     []  No LAD in inguinal areas  Deferred      CV [x]  RRR            []  No peripheral edema     []  Pedal pulses intact   [x]  No abnormal heart sounds appreciated   []  Extremities WWP         Resp [x]  Normal WOB at rest    []  No breathlessness with speaking, no coughing  []  CTA anteriorly    []  CTA bilaterally    Bilateral Air entry good, with intermittent bronchial breath; continues to have right-sided tenderness      GI [x]  Normal inspection, NTND   [x]  NABS     []  No umbilical hernia on exam       []  No hepatosplenomegaly     []  Inspection of perineal and perianal areas normal        GU []  Normal external genitalia     [] No urinary catheter present in urethra   []  No CVA tenderness    []  No tenderness over renal allograft  Deferred      MSK []  No clubbing or cyanosis of hands       []  No vertebral point tenderness  [x]  No focal tenderness or abnormalities on palpation of joints in RUE, LUE, RLE, or LLE        Skin [x]  No rashes, lesions, or ulcers of visualized skin     []  Skin warm and dry to palpation         Neuro [x]  Face expression symmetric  []  Sensation to light touch grossly intact throughout    [x]  Moves extremities equally    [x]  No tremor noted        []  CNs II-XII grossly intact     []  DTRs normal and symmetric throughout []  Gait unremarkable        Psych [x]  Appropriate affect       [x]  Fluent speech         []  Attentive, good eye contact  []  Oriented to person, place, time          [x]  Judgment and insight are appropriate             Data for Medical Decision Making     8/19 labs reviewed  Cbc mildly elevated   plts good  Cr stable     New Micro Data   Micro Reviewed - blood cultures remain negative    Recent Studies  none

## 2020-11-27 NOTE — Unmapped (Signed)
Spoke with Adam Keith to schedule him for COVID testing and ION scan on 8/31 and a EBUS/RoboNav on  9/2. Pre procedure instructions given as well as check in location. Advised he will get a call on 9/1 with the arrival time. Confirmed he does not take any blood thinners.

## 2020-11-30 NOTE — Unmapped (Signed)
Clinic has received correspondence from [facility] pertaining to the following:    ?? [name] has reviewed and signed Home Health Certification and Plan of Care for home health services provided from 11/17/2020 through 12/18/2020.    ?? The original version of the signed Home Health Certification and Plan of Care along with the corresponding facsimile confirmation page indicating the documents were successfully received back to the provider team are both archived in the media section of the electronic medical record.    No other actions taken.

## 2020-12-01 ENCOUNTER — Encounter
Admit: 2020-12-01 | Discharge: 2020-12-01 | Payer: MEDICAID | Attending: Infectious Disease | Primary: Infectious Disease

## 2020-12-01 DIAGNOSIS — C9100 Acute lymphoblastic leukemia not having achieved remission: Principal | ICD-10-CM

## 2020-12-01 DIAGNOSIS — R530 Neoplastic (malignant) related fatigue: Principal | ICD-10-CM

## 2020-12-01 LAB — SLIDE REVIEW

## 2020-12-01 LAB — CBC W/ AUTO DIFF
BASOPHILS ABSOLUTE COUNT: 0.2 10*9/L — ABNORMAL HIGH (ref 0.0–0.1)
BASOPHILS RELATIVE PERCENT: 0.8 %
EOSINOPHILS ABSOLUTE COUNT: 0.3 10*9/L (ref 0.0–0.5)
EOSINOPHILS RELATIVE PERCENT: 1.5 %
HEMATOCRIT: 39.1 % (ref 39.0–48.0)
HEMOGLOBIN: 13 g/dL (ref 12.9–16.5)
LYMPHOCYTES ABSOLUTE COUNT: 1.9 10*9/L (ref 1.1–3.6)
LYMPHOCYTES RELATIVE PERCENT: 9.9 %
MEAN CORPUSCULAR HEMOGLOBIN CONC: 33.2 g/dL (ref 32.0–36.0)
MEAN CORPUSCULAR HEMOGLOBIN: 30.9 pg (ref 25.9–32.4)
MEAN CORPUSCULAR VOLUME: 93.1 fL (ref 77.6–95.7)
MEAN PLATELET VOLUME: 8.6 fL (ref 6.8–10.7)
MONOCYTES ABSOLUTE COUNT: 0.9 10*9/L — ABNORMAL HIGH (ref 0.3–0.8)
MONOCYTES RELATIVE PERCENT: 4.8 %
NEUTROPHILS ABSOLUTE COUNT: 16 10*9/L — ABNORMAL HIGH (ref 1.8–7.8)
NEUTROPHILS RELATIVE PERCENT: 83 %
PLATELET COUNT: 266 10*9/L (ref 150–450)
RED BLOOD CELL COUNT: 4.2 10*12/L — ABNORMAL LOW (ref 4.26–5.60)
RED CELL DISTRIBUTION WIDTH: 16.3 % — ABNORMAL HIGH (ref 12.2–15.2)
WBC ADJUSTED: 19.2 10*9/L — ABNORMAL HIGH (ref 3.6–11.2)

## 2020-12-01 LAB — COMPREHENSIVE METABOLIC PANEL
ALBUMIN: 3.8 g/dL (ref 3.4–5.0)
ALKALINE PHOSPHATASE: 116 U/L (ref 46–116)
ALT (SGPT): 27 U/L (ref 10–49)
ANION GAP: 6 mmol/L (ref 5–14)
AST (SGOT): 22 U/L (ref <=34)
BILIRUBIN TOTAL: 0.2 mg/dL — ABNORMAL LOW (ref 0.3–1.2)
BLOOD UREA NITROGEN: 21 mg/dL (ref 9–23)
BUN / CREAT RATIO: 12
CALCIUM: 9.4 mg/dL (ref 8.7–10.4)
CHLORIDE: 107 mmol/L (ref 98–107)
CO2: 24 mmol/L (ref 20.0–31.0)
CREATININE: 1.77 mg/dL — ABNORMAL HIGH
EGFR CKD-EPI (2021) MALE: 49 mL/min/{1.73_m2} — ABNORMAL LOW (ref >=60)
GLUCOSE RANDOM: 63 mg/dL — ABNORMAL LOW (ref 70–179)
POTASSIUM: 4.4 mmol/L (ref 3.4–4.8)
PROTEIN TOTAL: 6.9 g/dL (ref 5.7–8.2)
SODIUM: 137 mmol/L (ref 135–145)

## 2020-12-01 LAB — PROTIME-INR
INR: 0.84
PROTIME: 9.8 s — ABNORMAL LOW (ref 10.3–13.4)

## 2020-12-01 NOTE — Unmapped (Signed)
Received staff message from ID reporting plan to change from linezolid to tedizolid on 8/30, so it would be reasonable to increase his nortriptyline from 75 to 100mg  daily on 9/6. Grier Rocher, patient's wife, and reported this plan. Will follow up with lab on previously ordered nortriptyline level order on 9/12.

## 2020-12-02 ENCOUNTER — Ambulatory Visit: Admit: 2020-12-02 | Discharge: 2020-12-03 | Payer: MEDICAID

## 2020-12-02 DIAGNOSIS — Z01818 Encounter for other preprocedural examination: Principal | ICD-10-CM

## 2020-12-03 ENCOUNTER — Telehealth
Admit: 2020-12-03 | Discharge: 2020-12-04 | Payer: MEDICAID | Attending: Student in an Organized Health Care Education/Training Program | Primary: Student in an Organized Health Care Education/Training Program

## 2020-12-03 DIAGNOSIS — Z515 Encounter for palliative care: Principal | ICD-10-CM

## 2020-12-03 MED ORDER — HYDROMORPHONE 2 MG TABLET
ORAL_TABLET | Freq: Four times a day (QID) | ORAL | 0 refills | 8.00000 days | Status: CP | PRN
Start: 2020-12-03 — End: 2021-12-03
  Filled 2020-12-14: qty 30, 8d supply, fill #0

## 2020-12-03 NOTE — Unmapped (Signed)
I have contacted Adam Keith to review his concerns that were shared with our clinic via a [employer].    ?? At the present time, the patient is to continue his oral antibiotic therapy and repeat chest CT imaging is anticipated to be completed during the latter part of September 2022 as managed by Dr. [name] from the infectious disease team.    ?? With regard to maintaining central IV access, [name] has recommended Adam Keith keep his existing PICC line at least through completion of the next upcoming cycle of blinatumomab tentatively set to start on 12/14/2020.  ?? Adam Keith is amenable to this plan and if continued central IV access is warranted, he would like to pursue port placement as an option at that time.    ?? Adam Keith is set to return to the Leukemia Care Team for follow-up on 12/14/2020 and will proceed with cycle 4 blinatumomab.    ?? In addition, the following scheduling requests have been made pertaining to blinatumomab bag changes on the following days:  ?? 12/14/2020: Start blinatumomab cycle 4  ?? 12/21/2020: Blinatumomab bag change  ?? 12/28/2020: Blinatumomab bag change  ?? 01/04/2021: Blinatumomab bag change  ?? 01/11/2021: Blinatumomab pump disconnect     ?? In addition, infusion center services have also been coordinated on 01/11/2021 pertaining to the following:  ?? LP/IT chemotherapy  ?? End of cycle repeat bone marrow biopsy    ?? Follow-up with [name] will be coordinated next on Wednesday, January 13, 2021 to review the plan of care.    Otherwise, the patient expressed verbal understanding and had no other questions or concerns at this time.  He is in agreement with the plan outlined above and will contact her clinic back sooner should there be any other issues.    No other actions taken and this updates been shared with the Leukemia Care Team.

## 2020-12-03 NOTE — Unmapped (Signed)
Ok so he is getting an LP and bmbx on 10/10?

## 2020-12-03 NOTE — Unmapped (Signed)
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     Assessment/Plan:   #Pain: Increased following his recent hospitalization, with a higher daily PRN dilaudid requirement, but is now returning to baseline. Patient has 8 dilaudid pills left.  - Continue oxycontin 15mg  PO BID       - Refilled today, can fill 12/10/20  - Continue dilaudid 2mg  PO q6hr PRN        - Refilled today, can fill today  - Continue gabapentin 300mg  PO TID    #Opioid-Induced Constipation: Patient denies.   - Continue senna qnight    #Fatigue: Energy level is improving overall, and has been very good for the last few days.  - Continue to follow    #Anxiety: Mood overall has been good the last few days. Followed by [name] NP.  - Continue nortriptyline, PRN klonipin    #Advanced Care Planning: Did not address this visit, but patient is looking forward to completing his treatment, despite the setback of his hospitalization.  - Continue to follow    #Controlled substances risk management:  ??? Patient does not have a signed pain medication agreement with our team.  ??? [place] database was reviewed today and it was appropriate.  ??? Urine drug screen was not performed at this visit. Findings: not applicable.  ??? Patient has received information about safe storage and administration of medications.  ??? Patient has not received a prescription for narcan; is not applicable.     F/u: 1 month    ----------------------------------------  Referring Provider: Dr. Berline Lopes  Oncology Team: Malignant hematology team  PCP: [name], MD    HPI: Adam Keith with a diagnosis of B-ALL, diagnosed in October 2021.  Recently had an 8-day hospitalization, discharged on 3/26.    Patient was admitted to [facility] 11/17/20-11/22/20 for MRSA PNA/bacteremia, c/b sepsis requiring intubation. Following discharge, he had increased pain requiring additional dilaudid doses, as well as increased fatigue and anxiety. He still has some pain with cough, but his symptoms have improved over the last few days, and he has been able to spend more time in his wood shop (though he is still limited by his PICC line). While he recovers, his wife has been helping with chores and errands, and he is generally trying to avoid crowded spaces until he feels better.    Due to his hospitalization, his cancer infusions were put on hold, but he is hoping to finish these by the end of the year.    Symptom Review:  General: Feeling better these past few days  Pain: Improving back to baseline  Fatigue: Improving  Mobility: Improving  Sleep: Stable  Appetite: Stable  Nausea: Denies  Bowel function: Stable  Mood: Improving    Palliative Performance Scale: 70% - Ambulation: Reduced / unable to do normal work, some evidence of disease / Self-Care: Full / Intake: Normal or reduced / Level of Conscious: Full    Coping/Support Issues: Some difficulty coping with his current illness.  However, well supported by his wife.    Goals of Care: Focused on treatment and cancer directed therapy.    Social History:   Name of primary support: [name]  Occupation: Was in the [employer]	[place]  Hobbies: Training and development officer  Current residence / distance from [employer]: [facility]    Advance Care Planning: Did not discuss this visit.    Objective  Allergies:   Allergies   Allergen Reactions   ??? Bupropion Hcl Other (See Comments)     Per patient out of touch with reality, suicidal, homicidal   ??? Cefepime Rash     DRESS   ??? Ceftaroline Fosamil Rash and Other (See Comments)     Rash X 2 02/2020, suspected DRESS 06/2020   ??? 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 Onion Anaphylaxis   ??? Vancomycin Analogues      Hearing loss with Lasix  Other reaction(s): Other (See Comments)  Hearing loss  lasix   ??? Bismuth Subsalicylate Nausea And Vomiting   ??? Furosemide      With Vancomycin caused hearing loss  Other reaction(s): Other (See Comments)  With Vancomycin caused hearing loss       Family History:  Cancer-related family history is negative for Melanoma.  He indicated that the status of his neg hx is unknown.    REVIEW OF SYSTEMS:  A comprehensive review of 10 systems was negative except for pertinent positives noted in HPI.    PHYSICAL EXAM:   Virtual visit. Patient appeared comfortable, and breathing was normal.    Lab Results   Component Value Date    CREATININE 1.77 (H) 12/01/2020     Lab Results   Component Value Date    ALKPHOS 116 12/01/2020    BILITOT 0.2 (L) 12/01/2020    BILIDIR <0.10 11/19/2020    PROT 6.9 12/01/2020    ALBUMIN 3.8 12/01/2020    ALT 27 12/01/2020    AST 22 12/01/2020      I personally spent 40 minutes face-to-face and non-face-to-face in the care of this patient, which includes all pre, intra, and post visit time on the date of service.     Barbette Merino MD MPH  Hospice and Palliative Medicine Fellow

## 2020-12-03 NOTE — Unmapped (Signed)
I called Adam Keith to discuss his symptoms and his CT yesterday showing some improvement in the patchy consolidative opacities in the RUL and RML.     He reports he is feeling a lot better in the last couple of days and continues to improve. He has no respiratory complaints and would prefer not to have a procedure if it isn't absolutely necessary.    We'll plan to cancel his bronchoscopy for tomorrow and his ID team will treat him for another month and repeat chest imaging at that time. If the opacities don't continue to improve we can discuss bronchoscopy again at that time.    Adam Keith is agreeable to this plan.    Hollis Tuller R. Maple Mirza, DO  Assistant Professor of Medicine  Interventional Pulmonology  Division of Pulmonary Diseases and Critical Care Medicine

## 2020-12-04 NOTE — Unmapped (Signed)
Addended by: Marita Kansas on: 12/04/2020 12:10 PM     Modules accepted: Level of Service

## 2020-12-04 NOTE — Unmapped (Signed)
No Bedside Lumbar Puncture on Mondays.  Only available Wednesday, Thursday, and Friday

## 2020-12-04 NOTE — Unmapped (Signed)
Addended by: Darlen Round on: 12/04/2020 02:41 PM     Modules accepted: Orders

## 2020-12-05 MED ORDER — NORTRIPTYLINE 50 MG CAPSULE
ORAL_CAPSULE | Freq: Every evening | ORAL | 11 refills | 30 days | Status: CP
Start: 2020-12-05 — End: 2021-12-05

## 2020-12-08 DIAGNOSIS — C9101 Acute lymphoblastic leukemia, in remission: Principal | ICD-10-CM

## 2020-12-08 DIAGNOSIS — C9100 Acute lymphoblastic leukemia not having achieved remission: Principal | ICD-10-CM

## 2020-12-09 NOTE — Unmapped (Signed)
As requested by the patient, I have contacted him to review/clarify the following:    ?? Adam Keith has recently expressed a significant amount of dissatisfaction with receiving home care services related to his PICC line over the Labor Day holiday.    ?? Unfortunately, home health services coordinated by [facility] over the Labor Day holiday weekend were temporarily provided by an alternate home health provider that resulted in a suboptimal experience for Adam Keith.  ?? Thankfully, Adam Keith was able to work with the nursing team from [facility] and his PICC line dressing change needs have been resolved.    ?? Going forward, it is anticipated that PICC line dressing changes will be done once weekly on Mondays starting on 12/14/2020 and concluding on 01/11/2021 that coincides with the treatment plan for blinatumomab.    ?? In addition, Adam Keith also indicated that he may be inclined to have the bone marrow biopsy and LP/IT chemotherapy visits scheduled on 2 separate days.  ?? Should the scheduling change be necessary, the patient will notify our team via an updated [employer].    Otherwise, the patient expressed verbal understanding and had no other questions or concerns at this time.  He is agreeable to the plan outlined above and will contact the clinic back sooner should he have any other issues.    No other actions taken.

## 2020-12-10 MED ORDER — OXYCONTIN 15 MG TABLET,CRUSH RESISTANT,EXTENDED RELEASE
ORAL_TABLET | Freq: Two times a day (BID) | ORAL | 0 refills | 28.00000 days | Status: CP
Start: 2020-12-10 — End: ?

## 2020-12-11 NOTE — Unmapped (Signed)
[facility]    Patient Name: Adam Keith  Patient Age: 40 y.o.  Encounter Date: 12/14/2020    Primary Care Provider:  [name], MD    Referring Physician:  Lenon Ahmadi, AGNP  [address]  [id number]  [place],  [place] [address]    Reason for visit:  Ph+ B-ALL follow up to consider timing of therapy change    Assessment:  A 40 y.o. 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 The course was complicated by septic shock, candida krusei fungemia, MRSA bacteremia with septic emboli c/b acute renal failure and respiratory distress requiring dialysis and intubation.  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  He subsequently had difficulty tolerating single agent dasatinib (as a bridge to planned ponatinib-blinatumomab--had fluid retention and pleural effusion).        He is s/p cycle 3 of ponatinib-blinatumomab. This course was complicated by MRSA pneumonia requiring MICU level of care including intubation. He has recovered well and completed IV antibiotics. Initial plan was for repeat bronch but with improvement, Pulmonology, ICID, and [name] agreed to defer for now.    In the interim, BCR-ABL in peripheral blood is now negative. Will clarify with [name] if ponatinib canlbe dose-reduced now or await bone marrow biopsy.      Plan Evusheld when he returns to clinic later this week.    Other issues:   DRESS - now off the steroids (As of today)         Plan and Recommendations:  Ph+ B-ALL: CSF with rare blasts 12/6, now in CR (low level BCR-ABL) and no blasts in CSF  - START Cycle 4 blinatumomab-ponatinib   - Consider decreasing Ponatinib from 30mg  to 15mg  (see update above)   - Continue aspirin while on ponatinib   - Plan a total of 4 cycles and then transition to ponatinib maintenance   - next bone marrow biopsy planned for the end of cycle 4 - orders in place   - IT triple therapy 1x per cycle   -could consider intensification of therapy at later date, depending on continued PS and renal function improvements    DRESS: followed by Dr. 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 Now s/p dialysis  - stable Cr appears to have developed CKD due to incompletely healed acute injury.  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   - Dr. [name] and Dr. Maple Mirza following patient  **Hx Candidemia during induction  -isavuconazole   - followed by Dr. [name]  **Hx Covid-19   - symptoms now resolved    Symptom management: bowel regimen as listed in medication list, analgesics as listed in medication list or symptoms reviewed and due to acceptable control, no changes made  **Peripheral neuropathy, sensory and motor  - Grade 1-2 monitor  **Chronic Back Pain, complicated by hospital stay and procedures  - Followed by Dr. Genice Rouge, Palliative Care  **Depression/anxiety: met with [name], [facility]  - nortriptyline  - PRN clonazepam  -  has not been using PRN hydroxyzine      Venous access: PICC line in place     Supportive Care Recommendations:  We recommend based on the patient???s underlying diagnosis and treatment history the following supportive care:      1. Antimicrobial prophylaxis:    - Viral - valacyclovir 500mg  daily  - Bacterial - levofloxacin 500 mg if ANC <0.5  - Fungal - on treatment isavuconazole  - PJP - Bactrim DS BID Sat/Sun  -Evusheld 04/21/2020-recommend another dose to achieve FDA recommendation update.    2. Blood product support:  I recommend the following intervals for laboratory monitoring to determine transfusion needs and monitor for hematologic effects of therapy and underlying disease: no routine monitoring outside of clinic follow-up    Leukoreduced blood products are required.  Irradiated blood products are preferred, but in case of urgent transfusion needs non-irradiated blood products may be used:     -  RBC transfusion threshold: transfuse 2 units for Hgb < 8 g/dL.  -  Platelet transfusion threshold: transfuse 1 unit of platelets for platelet count < 10, or for bleeding or need for invasive procedure.    3. Hematopoietic growth factor support: none      Care coordination: The next [facility] Follow up requested:  - bone marrow biopsy s/p cycle  - RTC after bmbx      These services include the following elements of medical decision making:  addressing a hematologic cancer that poses a threat to life and/or bone marrow function  interpretation of diagnostic test reports or ordering of diagnostic tests and independent interpretation of diagnostic tests  High risk of morbidity due to drug therapy requiring monitoring for toxicity or decisions regarding goals and continuation of antineoplastic therapy    [name], AGPCNP-BC  Nurse Practitioner  Hematology/Oncology  [facility]      Troy Sine. [name], MD  Leukemia Program  Division of Hematology/Oncology  [facility]    Nurse Navigator (non-clinical trial patients): Colletta Maryland, RN        Tel. [phone]       Fax. [phone]  Toll-free appointments: [phone]  Scheduling assistance: [phone]  After hours/weekends: [phone] (ask for adult hematology/oncology on-call)        History of Present Illness:  We had the pleasure of seeing Adam Keith in the Leukemia Clinic at the [facility] on 12/14/2020.  He is a 40 y.o. male with Ph+ B-ALL.          His oncologic history is as follows:      Oncology History Overview Note   Referring/Local Oncologist: None    Diagnosis:Ph+ ALL    Genetics:    Karyotype/FISH:Abnormal Karyotype: 46,XY,t(9;22)(q34;q11.2)[1]/45,XY,der(7;9)(q10;q10)t(9;22)(q34;q11.2),der(22)t(9;22)[11]/46,sdl,+der(22)t(9;22)[5]/46,XY[3]     Abnormal FISH: A BCR/ABL1 interphase FISH assay shows an abnormal signal pattern in 97% of the 100 cells scored. 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  The findings support a diagnosis of leukemia and have implications for targeted therapy and for monitoring residual disease.      Molecular Genetics:  BCR-ABL1 p210 transcripts were detected at a level of 46.479 IS% ratio in bone marrow.  BCR-ABL1 p190 transcripts were detected at a level of 4 in 100,000 cells in bone marrow.    Pertinent Phenotypic data:    Disease-specific prognostic estimate: High Risk, Ph+  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)   01/23/2020 Initial Diagnosis    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)     01/24/2020 - 04/02/2020 Chemotherapy    IP/OP LEUKEMIA GRAAPH-2005 + RITUXIMAB < 60 YO  rituximab hypercvad (odd and even course)     02/24/2020 Remission    CR with PCR MRD+; marrow showing 70% blasts with <1% blasts, negative flow MRD, BCR-ABL p210 PCR 0.197%; CNS negative for blasts     03/09/2020 Progression    CSF - rare blast ID'ed - IT chemo given    03/13/20 - IT chemo, CSF negative     04/16/2020 Biopsy    BM Bx with 40-50% cellularity, <1% blasts, MRD flow cytometry negative, BCR-ABL p210 transcripts 0.036%     05/28/2020 - 05/28/2020 Chemotherapy    OP AML - CNS THERAPY (INTRATHECAL CYTARABINE, INTRATHECAL METHOTREXATE, OR INTRATHECAL TRIPLE)  Select one of the following: cytarabine IT 100 mg with hydrocortisone 50 mg, cytarabine IT 40 mg with methotrexate 15 mg with hydrocortisone 50 mg, OR methotrexate IT 12 mg with hydrocortisone 50 mg     06/19/2020 -  Chemotherapy    IP/OP LEUKEMIA BLINATUMOMAB 7-DAY INFUSION (MINIMAL RESIDUAL DISEASE; WT >= 22 KG) (HOME INFUSION)      Cycles 1*-4: Blinatumomab 28 mcg/day Days 1-28 of 6-week cycle.  *Given in the inpatient setting on Days 1-3 on Cycle 1 and Days 1-2 on Cycle 2, while other treatment days are given in the outpatient setting.    Cycle 1 MRD Dosing     07/18/2020 Adverse Reaction    Hospitalization: fevers. Pneumonia     07/23/2020 Biopsy    Diagnosis  Bone marrow, right iliac, aspiration and biopsy  -   Normocellular bone marrow (50%) with trilineage hematopoiesis and 1% blasts by manual aspirate differential  -   Flow cytometry MRD analysis reveals no definitive immunophenotypic evidence of residual B lymphoblastic leukemia   - BCR-ABL p210 0.006%         08/10/2020 -  Chemotherapy    Cycle 2 blinatumomab-ponatinib  Ponatinib 30mg     1 IT per cycle     09/21/2020 Adverse Reaction    Covid-19, symptomatic but not requiring hospitalization.     10/06/2020 -  Chemotherapy    Cycle 3 blinatumomab-ponatinib  Ponatinib 30mg        11/01/2020 Adverse Reaction    Hospitalization: MRSA Pneumonia requiring intubation    Blinatumomab stopped.  Ponatinib held until he stabilized.       ALL (acute lymphoblastic leukemia) (CMS-HCC)   06/11/2020 -  Chemotherapy    IP/OP LEUKEMIA BLINATUMOMAB 7-DAY INFUSION (MINIMAL RESIDUAL DISEASE; WT >= 22 KG) (HOME INFUSION)  Cycles 1*-4: Blinatumomab 28 mcg/day Days 1-28 of 6-week cycle.  *Given in the inpatient setting on Days 1-3 on Cycle 1 and Days 1-2 on Cycle 2, while other treatment days are given in the outpatient setting.     09/21/2020 Initial Diagnosis    ALL (acute lymphoblastic leukemia) (CMS-HCC)         Interim History:  Since his last visit he reports doing okay. He had to cancel his vacation due $$ concerns. His energy level is okay. He works in [employer] frequently and his wife helps him remember to take breaks.  He has a few abrasions from the woodworking but no other bleeding or unusual bruising.  Breathing feels okay.  No new pain.  No fevers.    Past Medical, Surgical and Family  History were reviewed and pertinent updates were made in the Electronic Medical Record      ECOG Performance Status: 1    Medications:    Current Outpatient Medications   Medication Sig Dispense Refill   ??? acetaminophen (TYLENOL) 325 MG tablet Take 650 mg by mouth every six (6) hours as needed for pain.      ??? amLODIPine (NORVASC) 10 MG tablet Take 1 tablet (10 mg total) by mouth daily. 30 tablet 0   ??? aspirin 81 MG chewable tablet Chew 1 tablet (81 mg total) daily. 36 tablet 11   ??? carvediloL (COREG) 12.5 MG tablet TAKE 1 TABLET BY MOUTH TWO TIMES A DAY. 180 tablet 1   ??? clonazePAM (KLONOPIN) 0.5 MG tablet Take 1 tablet (0.5 mg total) by mouth two (2) times a day as needed for anxiety or sleep. 60 tablet 1   ??? gabapentin (NEURONTIN) 300 MG capsule Take 1 capsule (300 mg total) by mouth Three (3) times a day. 270 capsule 6   ??? HYDROmorphone (DILAUDID) 2 MG tablet Take 1 tablet (2 mg total) by mouth every six (6) hours as needed for pain,moderate (4-6). 30 tablet 0   ??? hydrOXYzine (ATARAX) 25 MG tablet Take 1 tablet (25 mg total) by mouth every six (6) hours as needed. 60 tablet 1   ??? isavuconazonium sulfate (CRESEMBA) 186 mg cap capsule Take 2 capsules (372 mg total) by mouth daily. 56 capsule 2   ??? nortriptyline (PAMELOR) 50 MG capsule Take 2 capsules (100 mg total) by mouth nightly. 60 capsule 11   ??? OXYCONTIN 15 mg 12 hr crush resistant ER/CR tablet Take 1 tablet (15 mg total) by mouth Two (2) times a day. 56 tablet 0   ??? PONATinib (ICLUSIG) 30 mg tablet Take 1 tablet (30 mg total) by mouth daily. Swallow tablets whole. Do not crush, break, cut or chew tablets. 30 tablet 5   ??? sulfamethoxazole-trimethoprim (BACTRIM DS) 800-160 mg per tablet Take 1 tablet (160 mg of trimethoprim total) by mouth 2 times a day on Saturday, Sunday. For prophylaxis while on chemo. 48 tablet 3   ??? tedizolid 200 mg Tab Take 1 tablet (200 mg total) by mouth daily. 30 tablet 5   ??? valACYclovir (VALTREX) 500 MG tablet Take 1 tablet (500 mg total) by mouth daily. 90 tablet 11     No current facility-administered medications for this visit.       Vital Signs:  There were no vitals filed for this visit.      Relevant Physical Exam Findings:  General: Resting in no apparent distress, accompanied by spouse  HEENT:  Clear sclera, conjunctiva, mask in place  LYMPH:  no palpable cervical, supraclavicular, axillary, or inguinal nodes  CARDAC: RRR, no R,M,Gs, no pitting peripheral edema  RESP: nonlabored, scattered wheeze, otherwise CTA  GI: Soft, nontender, active bowel sounds, no hepatic or splenomegaly  NEURO: alert, 0x4, steady gait, no focal deficits  PSYCH: anxious  DERM: no visible rashes, lesions; few scattered scratches on forearms, foot  LINE: PICC      Relevant Laboratory, radiology and pathology results:  I personally viewed the most recent internal records, laboratory results, hematopathology report and molecular pathology reports  and discussed the available results with the patient and family  A summary of results follows:  No visits with results within 1 Week(s) from this visit.   Latest known visit with results is:   Appointment on 12/02/2020   Component Date Value Ref Range Status   ???  SARS-CoV-2 PCR 12/02/2020 Not Detected  Not Detected Final     Chest X-ray PA and lateral ??Increased interstitial edema versus atypical infection/inflammation.  ??  Small right pleural effusion.    Diagnosis   Date Value Ref Range Status   09/18/2020   Final    A:  Cerebrospinal fluid, cytospin review    -  Bloody specimen with no blasts identified    This electronic signature is attestation that the pathologist personally reviewed the submitted material(s) and the final diagnosis reflects that evaluation.       Diagnosis   Date Value Ref Range Status   09/18/2020   Final    A:  Cerebrospinal fluid, cytospin review    - Bloody specimen with no blasts identified    This electronic signature is attestation that the pathologist personally reviewed the submitted material(s) and the final diagnosis reflects that evaluation.     08/07/2020   Final    A:  Cerebrospinal fluid, cytospin review and flow cytometry   -  No blasts identified    This electronic signature is attestation that the pathologist personally reviewed the submitted material(s) and the final diagnosis reflects that evaluation.     07/23/2020   Final    Bone marrow, right iliac, aspiration and biopsy  -   Normocellular bone marrow (50%) with trilineage hematopoiesis and 1% blasts by manual aspirate differential  -   Flow cytometry MRD analysis reveals no definitive immunophenotypic evidence of residual B lymphoblastic leukemia   -  See linked reports for associated Ancillary Studies.      This electronic signature is attestation that the pathologist personally reviewed the submitted material(s) and the final diagnosis reflects that evaluation.

## 2020-12-12 DIAGNOSIS — G4700 Insomnia, unspecified: Principal | ICD-10-CM

## 2020-12-12 DIAGNOSIS — F419 Anxiety disorder, unspecified: Principal | ICD-10-CM

## 2020-12-12 DIAGNOSIS — F331 Major depressive disorder, recurrent, moderate: Principal | ICD-10-CM

## 2020-12-12 MED ORDER — NORTRIPTYLINE 50 MG CAPSULE
ORAL_CAPSULE | Freq: Every evening | ORAL | 11 refills | 30.00000 days
Start: 2020-12-12 — End: 2021-12-12

## 2020-12-14 ENCOUNTER — Ambulatory Visit: Admit: 2020-12-14 | Discharge: 2020-12-15 | Payer: MEDICAID | Attending: Adult Health | Primary: Adult Health

## 2020-12-14 ENCOUNTER — Ambulatory Visit: Admit: 2020-12-14 | Discharge: 2020-12-15 | Payer: MEDICAID

## 2020-12-14 ENCOUNTER — Other Ambulatory Visit: Admit: 2020-12-14 | Discharge: 2020-12-15 | Payer: MEDICAID

## 2020-12-14 DIAGNOSIS — C9101 Acute lymphoblastic leukemia, in remission: Principal | ICD-10-CM

## 2020-12-14 LAB — COMPREHENSIVE METABOLIC PANEL
ALBUMIN: 3.8 g/dL (ref 3.4–5.0)
ALKALINE PHOSPHATASE: 79 U/L (ref 46–116)
ALT (SGPT): 24 U/L (ref 10–49)
ANION GAP: 5 mmol/L (ref 5–14)
AST (SGOT): 19 U/L (ref <=34)
BILIRUBIN TOTAL: 0.2 mg/dL — ABNORMAL LOW (ref 0.3–1.2)
BLOOD UREA NITROGEN: 17 mg/dL (ref 9–23)
BUN / CREAT RATIO: 11
CALCIUM: 9.3 mg/dL (ref 8.7–10.4)
CHLORIDE: 110 mmol/L — ABNORMAL HIGH (ref 98–107)
CO2: 22 mmol/L (ref 20.0–31.0)
CREATININE: 1.51 mg/dL — ABNORMAL HIGH
EGFR CKD-EPI (2021) MALE: 59 mL/min/{1.73_m2} — ABNORMAL LOW (ref >=60)
GLUCOSE RANDOM: 105 mg/dL (ref 70–179)
POTASSIUM: 4.1 mmol/L (ref 3.4–4.8)
PROTEIN TOTAL: 6.4 g/dL (ref 5.7–8.2)
SODIUM: 137 mmol/L (ref 135–145)

## 2020-12-14 LAB — CBC W/ AUTO DIFF
BASOPHILS ABSOLUTE COUNT: 0.2 10*9/L — ABNORMAL HIGH (ref 0.0–0.1)
BASOPHILS RELATIVE PERCENT: 1.2 %
EOSINOPHILS ABSOLUTE COUNT: 0.4 10*9/L (ref 0.0–0.5)
EOSINOPHILS RELATIVE PERCENT: 2.8 %
HEMATOCRIT: 37.3 % — ABNORMAL LOW (ref 39.0–48.0)
HEMOGLOBIN: 12.6 g/dL — ABNORMAL LOW (ref 12.9–16.5)
LYMPHOCYTES ABSOLUTE COUNT: 2.8 10*9/L (ref 1.1–3.6)
LYMPHOCYTES RELATIVE PERCENT: 22.4 %
MEAN CORPUSCULAR HEMOGLOBIN CONC: 33.7 g/dL (ref 32.0–36.0)
MEAN CORPUSCULAR HEMOGLOBIN: 30.9 pg (ref 25.9–32.4)
MEAN CORPUSCULAR VOLUME: 91.7 fL (ref 77.6–95.7)
MEAN PLATELET VOLUME: 7.9 fL (ref 6.8–10.7)
MONOCYTES ABSOLUTE COUNT: 0.7 10*9/L (ref 0.3–0.8)
MONOCYTES RELATIVE PERCENT: 5.6 %
NEUTROPHILS ABSOLUTE COUNT: 8.6 10*9/L — ABNORMAL HIGH (ref 1.8–7.8)
NEUTROPHILS RELATIVE PERCENT: 68 %
PLATELET COUNT: 206 10*9/L (ref 150–450)
RED BLOOD CELL COUNT: 4.07 10*12/L — ABNORMAL LOW (ref 4.26–5.60)
RED CELL DISTRIBUTION WIDTH: 17.9 % — ABNORMAL HIGH (ref 12.2–15.2)
WBC ADJUSTED: 12.6 10*9/L — ABNORMAL HIGH (ref 3.6–11.2)

## 2020-12-14 LAB — C-REACTIVE PROTEIN: C-REACTIVE PROTEIN: 18 mg/L — ABNORMAL HIGH (ref <=10.0)

## 2020-12-14 LAB — PHOSPHORUS: PHOSPHORUS: 4 mg/dL (ref 2.4–5.1)

## 2020-12-14 LAB — MAGNESIUM: MAGNESIUM: 1.6 mg/dL (ref 1.6–2.6)

## 2020-12-14 MED ORDER — OXYCONTIN 15 MG TABLET,CRUSH RESISTANT,EXTENDED RELEASE
ORAL_TABLET | Freq: Two times a day (BID) | ORAL | 0 refills | 28 days | Status: CP
Start: 2020-12-14 — End: ?

## 2020-12-14 MED ORDER — NORTRIPTYLINE 50 MG CAPSULE
ORAL_CAPSULE | Freq: Every evening | ORAL | 11 refills | 30.00000 days | Status: CP
Start: 2020-12-14 — End: 2021-12-14

## 2020-12-14 MED ADMIN — blinatumomab (BLINCYTO) 28 mcg/day in sodium chloride NON-PVC 0.9 % IVPB (7-DAY HOME INFUSION): 28 ug/d | INTRAVENOUS | @ 15:00:00 | Stop: 2020-12-21

## 2020-12-14 MED ADMIN — dexamethasone (DECADRON) 4 mg/mL injection 16 mg: 16 mg | INTRAVENOUS | @ 14:00:00 | Stop: 2020-12-14

## 2020-12-14 NOTE — Unmapped (Signed)
Pt arrived to infusion in NAD. Labs reviewed, orders released. Pt connected to home pump. 2 RN's checked all settings, clamps, and connections. Pump read running, green light flashing. AVS declined, discharged from infusion in NAD.

## 2020-12-14 NOTE — Unmapped (Signed)
PT in lab for blood draws, labs obtained via PICC and dressing changed by Dulcy Fanny, labs sent for analysis. Site clean dry and no s/s of infection.

## 2020-12-14 NOTE — Unmapped (Signed)
Lab ordered.

## 2020-12-18 ENCOUNTER — Ambulatory Visit: Admit: 2020-12-18 | Discharge: 2020-12-18 | Payer: MEDICAID

## 2020-12-18 ENCOUNTER — Ambulatory Visit
Admit: 2020-12-18 | Discharge: 2020-12-18 | Payer: MEDICAID | Attending: Infectious Disease | Primary: Infectious Disease

## 2020-12-18 DIAGNOSIS — C9101 Acute lymphoblastic leukemia, in remission: Principal | ICD-10-CM

## 2020-12-18 DIAGNOSIS — R5383 Other fatigue: Principal | ICD-10-CM

## 2020-12-18 DIAGNOSIS — A4902 Methicillin resistant Staphylococcus aureus infection, unspecified site: Principal | ICD-10-CM

## 2020-12-18 MED ADMIN — tixagevimab-cilgavimab 300 mg/3 mL- 300 mg/3 mL injection 6 mL: 6 mL | INTRAMUSCULAR | @ 15:00:00 | Stop: 2020-12-18

## 2020-12-18 NOTE — Unmapped (Signed)
Addended by: Vanna Scotland R on: 12/18/2020 11:29 AM     Modules accepted: Orders, Level of Service

## 2020-12-18 NOTE — Unmapped (Signed)
Pre vitals checked. Allergies checked. Patient advised of reaction symptoms and to let front desk or nursing staff know if starting to feel any different after injection. Patient given Evusheld in left and right upper outer quandrants. Patient tolerated well  Patient advised to remain in clinic for 1 hour to monitor for any symptoms. Patient verbalized understanding.

## 2020-12-18 NOTE — Unmapped (Signed)
IMMUNOCOMPROMISED HOST INFECTIOUS DISEASE PROGRESS NOTE    Assessment/Plan:     Adam.Adam [name] who presents with MRSA bacteremia/pneumonia and new pulmonary nodules presenting for a review today.    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  - 4/21 BMBx normocellular bone marrow 50% w/ 1% blasts (MRD)  - Extent of disease/CNS involvement:??rare blast on prior CSF,??intrathecal??ppx??(cytarabine, methotrexate, hydrocortisone)??last??09/18/2020  - Cancer-related complications:??TLS, hyperbilirubinemia, MRSA bacteremia w/ septic emboli/renal failure+dialysis/intubation, C. krusei fungemia  - Prior chemotherapy:??GRAAPPH-2005??induction??with dasatinib??(vincristine, dexamethasone and dasatinib);??C1D1 01/24/2020; difficulty tolerating single-agent dasatinib  - Current chemotherapy:??blinatumomab (anti-CD19/CD3),??C1D1 = 3/18 (symptomatic, titrated up dose and restarted 3/21) + ponatinib (TKI)??s/p??cycle 3 of 5; plan for one IT treatment per cycle; due for cycle 4 but holding due to infection and complete molecular remission  - 8/26 remains on ponatinib  - 9/12 restarted blinatumomab   ??  Pertinent Co-morbidities  # COPD/empysema  #??DIHS/DRESS ceftaroline 06/12/2020, relapse cefepume 11/02/2020  -????followed by Dr. Caryn Section, dermatology  - per prior ID notes possible culprits ceftaroline,??posaconazole, dasatinib, sotrovimab  - 8/1 increased pred from 15 -> 20mg  daily with resolution of rash -> discontinued 9/12  ????  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  Infection History  Active infections:??  #MRSA??RLL PNA complicated by??bacteremia, 11/01/2020  #Possible right empyema 11/13/2020, s/p failed thoracentesis 11/20/20  - 7/31 BCx 1/2 Staph aureus  - 7/31 LRCx Staph aureus  - 8/01 TTE without evidence of endocarditis  -??8/3??S/p Port-a-cath removal; cx tip no growth  - 8/12 fever, worsening right chest pain, restarted linezolid  - 8/16 CXR possible right empyema  - 8/17 admitted for eval of R pleuritic chest pain  - 8/17 CT chest New in comparison to the prior study is a small volume pleural effusion with small areas of loculation inferolaterally within the right hemithorax.   - 8/19 Failed thoracocentesis by IP  -8/31: CT chest : interval improvement   Rx: 7/31 linezolid/cefepime??-->??linezolid/daptomycin/cefepime -->8/02 linezolid/daptomycin??-> 8/9 dapto -> 8/12 linezolid+dapto -> 8/17 linezolid-->8/31 Tedizolid  ??  # New RML opacity 11/18/2020, presumed relapsed MRSA pneumonia  - 8/17 CT chest New 5.5 cm longest diameter lobular masslike opacity in the lateral segment of the right middle lobe  - 8/19 bronch was cancelled and rescheduled for 9/2  - 8/31 CT chest slightly improved  Rx: 8/17 linezolid+isavuconazole -> 9/1 linezolid -> 8/31 tedizolid  ??  #Possible COVID-18 associated fungal infection,??07/17/20  - 4/16 CT Chest: multifocal somewhat nodular consolidative opacities in b/l lungs that are relatively new, most concerning for infectious PNA (possibly fungal, especially given his recent high dose steroid taper)  - 4/18 bronch with XBJ:YNWG cx + MRSA, OP flora,??RPP negative,??Legionella cx, AFB cx, fungal cx, CMV PCR, PJP DFA, Asp??galactomannan??neg  - 7/28 CT chest??-Linear bands of scarring and vague groundglass opacities are all that remain in the various sites of nodular consolidation evident on prior imaging.  Rx: 4/18 cefepime/isavu/linezolid -> 4/19 linezolid??--> 4/21 isavuconazole/linezolid --> 5/10 isavuconazole --> stop 9/1  ??  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  Antimicrobial Intolerance/allergy  Cefepime - DRESS  Ceftaroline -??DRESS  Dapsone - possible??agranulocytosis, per chart anaphylaxis  Vancomycin - probable ototoxicity (in combination??with furosemide)  Isavuconazole - elevated LFTs in the setting of TKI  ??      RECOMMENDATIONS    Diagnostic  History of fatigue and prolonged steriods use >20mg  pred daily -> Serum Cortisol -12/21/20  CT chest on 12/28/2020    Treatment  ??? CONT PO Tedizolid 200mg  daily- tentative end date 12/29/2020.    Monitoring for antimicrobial toxicities  Adam Keith is currently receiving drug therapy requiring intensive lab monitoring for toxicity.  ??? Weekly CBP and BMP while on antimicrobials.    Prophylaxis  - Viral - valacyclovir 500mg  daily  - PJP - Bactrim DS BID Sat/Sun    ??Follow up   -Televideo Review 12/29/20          Recommendations were communicated via shared medical record.    Attending attestation  I saw and evaluated the patient, participating in the key portions of the service.  I reviewed the resident???s note.  I agree with the resident???s findings and plan.   [name], MD        Subjective     Interval History:      Adam Keith reports feeling improved today from the respiratory symptoms point of view.     He reports pleuritic chest pain is much improved and is well controlled with current analgesia. He denies productive cough, subjective fevers, chills or rigors. He is tolerating Tedizolid well, no nausea and vomiting.    Review of other systems : negative     Medications:  Antimicrobials: Tedizolid 200mg  daily   Current immunomodulators: Blinatumomab , Ponatinib  Other medications reviewed.    Objective     Vital signs:  There were no vitals taken for this visit.    Physical Exam:  Const [x]  vital signs above    []  NAD, non-toxic appearance        Eyes [x]  Lids normal bilaterally, conjunctiva anicteric and noninjected OU     [] PERRL [] EOMI        ENMT [x]  Normal appearance of external nose and ears, no nasal discharge        []  MMM, no lesions on lips or gums [x]  No thrush, leukoplakia, oral lesions  []  Dentition good []  Edentulous []  Dental caries present  []  Hearing normal  []  TMs with good light reflexes bilaterally         Neck [x]  Neck of normal appearance and trachea midline        [x]  No thyromegaly, nodules, or tenderness   []  Full neck ROM        Lymph [x]  No LAD in neck     []  No LAD in supraclavicular area     []  No LAD in axillae   []  No LAD in epitrochlear chains     []  No LAD in inguinal areas        CV []  RRR            []  No peripheral edema     []  Pedal pulses intact   []  No abnormal heart sounds appreciated   []  Extremities WWP         Resp [x]  Normal WOB at rest    [x]  No breathlessness with speaking, no coughing  [x]  CTA anteriorly    []  CTA bilaterally    Right LLZ : expiratory wheeze       GI [x]  Normal inspection, NTND   [x]   NABS     []  No umbilical hernia on exam       []  No hepatosplenomegaly     []  Inspection of perineal and perianal areas normal        GU []  Normal external genitalia     [] No urinary catheter present in urethra   []  No CVA tenderness    []  No tenderness over renal allograft  Deferred      MSK []  No clubbing or cyanosis of hands       []  No vertebral point tenderness  [x]  No focal tenderness or abnormalities on palpation of joints in RUE, LUE, RLE, or LLE  Right Arm PICC - clean      Skin [x]  No rashes, lesions, or ulcers of visualized skin     []  Skin warm and dry to palpation         Neuro [x]  Face expression symmetric  []  Sensation to light touch grossly intact throughout    [x]  Moves extremities equally    [x]  No tremor noted        []  CNs II-XII grossly intact     []  DTRs normal and symmetric throughout []  Gait unremarkable        Psych []  Appropriate affect       []  Fluent speech         []  Attentive, good eye contact  [x]  Oriented to person, place, time          []  Judgment and insight are appropriate   Minimal eye contact          Labs:  Recent Labs   Lab Units 12/14/20  0805 12/14/20  0748   WBC 10*9/L  --  12.6*   HEMOGLOBIN g/dL  --  16.1*   PLATELET COUNT (1) 10*9/L  --  206   NEUTRO ABS 10*9/L  --  8.6*   LYMPHO ABS 10*9/L  --  2.8   EOSINO ABS 10*9/L  --  0.4   BUN mg/dL 17  --    CREATININE mg/dL 0.96*  --    AST U/L 19  --    ALT U/L 24  --    BILIRUBIN TOTAL mg/dL 0.2*  --    ALK PHOS U/L 79  --    POTASSIUM mmol/L 4.1  --    MAGNESIUM mg/dL 1.6  --    PHOSPHORUS mg/dL 4.0  --    CALCIUM mg/dL 9.3  --    CRP mg/L 04.5*  --      I reviewed and noted the following labs: Leucocytosis 12.6 (downtrending) , CRP :18     Microbiology:  Past cultures were reviewed in Epic and CareEverywhere.  No new cultures      Imaging:  No new imaging    Additional Studies:   (7/31) EKG QTc 456

## 2020-12-21 ENCOUNTER — Other Ambulatory Visit: Admit: 2020-12-21 | Discharge: 2020-12-22 | Payer: MEDICAID

## 2020-12-21 ENCOUNTER — Ambulatory Visit: Admit: 2020-12-21 | Discharge: 2020-12-22 | Payer: MEDICAID

## 2020-12-21 DIAGNOSIS — C9101 Acute lymphoblastic leukemia, in remission: Principal | ICD-10-CM

## 2020-12-21 DIAGNOSIS — Z79899 Other long term (current) drug therapy: Principal | ICD-10-CM

## 2020-12-21 LAB — COMPREHENSIVE METABOLIC PANEL
ALBUMIN: 3.8 g/dL (ref 3.4–5.0)
ALKALINE PHOSPHATASE: 86 U/L (ref 46–116)
ALT (SGPT): 22 U/L (ref 10–49)
ANION GAP: 5 mmol/L (ref 5–14)
AST (SGOT): 16 U/L (ref <=34)
BILIRUBIN TOTAL: 0.3 mg/dL (ref 0.3–1.2)
BLOOD UREA NITROGEN: 15 mg/dL (ref 9–23)
BUN / CREAT RATIO: 9
CALCIUM: 9.4 mg/dL (ref 8.7–10.4)
CHLORIDE: 110 mmol/L — ABNORMAL HIGH (ref 98–107)
CO2: 22 mmol/L (ref 20.0–31.0)
CREATININE: 1.74 mg/dL — ABNORMAL HIGH
EGFR CKD-EPI (2021) MALE: 50 mL/min/{1.73_m2} — ABNORMAL LOW (ref >=60)
GLUCOSE RANDOM: 99 mg/dL (ref 70–179)
POTASSIUM: 4.2 mmol/L (ref 3.4–4.8)
PROTEIN TOTAL: 6.5 g/dL (ref 5.7–8.2)
SODIUM: 137 mmol/L (ref 135–145)

## 2020-12-21 LAB — CBC W/ AUTO DIFF
BASOPHILS ABSOLUTE COUNT: 0.1 10*9/L (ref 0.0–0.1)
BASOPHILS RELATIVE PERCENT: 1.1 %
EOSINOPHILS ABSOLUTE COUNT: 0.6 10*9/L — ABNORMAL HIGH (ref 0.0–0.5)
EOSINOPHILS RELATIVE PERCENT: 6.1 %
HEMATOCRIT: 38.7 % — ABNORMAL LOW (ref 39.0–48.0)
HEMOGLOBIN: 13.2 g/dL (ref 12.9–16.5)
LYMPHOCYTES ABSOLUTE COUNT: 1.8 10*9/L (ref 1.1–3.6)
LYMPHOCYTES RELATIVE PERCENT: 19.5 %
MEAN CORPUSCULAR HEMOGLOBIN CONC: 34.2 g/dL (ref 32.0–36.0)
MEAN CORPUSCULAR HEMOGLOBIN: 30.9 pg (ref 25.9–32.4)
MEAN CORPUSCULAR VOLUME: 90.2 fL (ref 77.6–95.7)
MEAN PLATELET VOLUME: 8 fL (ref 6.8–10.7)
MONOCYTES ABSOLUTE COUNT: 0.6 10*9/L (ref 0.3–0.8)
MONOCYTES RELATIVE PERCENT: 6.9 %
NEUTROPHILS ABSOLUTE COUNT: 6 10*9/L (ref 1.8–7.8)
NEUTROPHILS RELATIVE PERCENT: 66.4 %
PLATELET COUNT: 205 10*9/L (ref 150–450)
RED BLOOD CELL COUNT: 4.29 10*12/L (ref 4.26–5.60)
RED CELL DISTRIBUTION WIDTH: 17.4 % — ABNORMAL HIGH (ref 12.2–15.2)
WBC ADJUSTED: 9.1 10*9/L (ref 3.6–11.2)

## 2020-12-21 LAB — PHOSPHORUS: PHOSPHORUS: 3.4 mg/dL (ref 2.4–5.1)

## 2020-12-21 LAB — MAGNESIUM: MAGNESIUM: 1.7 mg/dL (ref 1.6–2.6)

## 2020-12-21 MED ADMIN — blinatumomab (BLINCYTO) 28 mcg/day in sodium chloride NON-PVC 0.9 % IVPB (7-DAY HOME INFUSION): 28 ug/d | INTRAVENOUS | @ 18:00:00 | Stop: 2020-12-28

## 2020-12-21 NOTE — Unmapped (Signed)
Labs drawn from PICC line by Donivan Scull.  Other lumen infusing blinitumumab.

## 2020-12-23 NOTE — Unmapped (Signed)
Has requested by the patient following has been completed:    ?? Two individual [employer] Forms have been completed and returned via facsimile as requested by the patient.  ?? The original version of the submitted documents along with the facsimile confirmation page indicating they were successfully received are both archived in the media section of the electronic medical record.    ?? The next blinatumomab bag change remains tentatively set on 12/28/2020 and follow-up with the Leukemia Care Team in the clinic setting will be coordinated for around Wednesday, January 13, 2021.    Otherwise, the patient expressed verbal understanding and had no other questions or concerns at this time.  He is amenable to the plan outlined above and will contact clinic back sooner should he have any other issues.    No other actions taken and this updates been shared with Leukemia Care Team.

## 2020-12-25 LAB — NORTRIPTYLINE LEVEL: NORTRIPTYLINE: 66 ng/mL — ABNORMAL LOW

## 2020-12-25 NOTE — Unmapped (Signed)
Appointment rescheduled    Thanks!

## 2020-12-25 NOTE — Unmapped (Signed)
Patient needs to reschedule his appointment on 12/28/20 due to conflict with another Dr appointment.    Please schedule Patient for another appointment .    Thank you.

## 2020-12-28 ENCOUNTER — Ambulatory Visit: Admit: 2020-12-28 | Discharge: 2020-12-29 | Payer: MEDICAID

## 2020-12-28 ENCOUNTER — Ambulatory Visit: Admit: 2020-12-28 | Discharge: 2020-12-29 | Payer: MEDICAID | Attending: Internal Medicine | Primary: Internal Medicine

## 2020-12-28 ENCOUNTER — Other Ambulatory Visit: Admit: 2020-12-28 | Discharge: 2020-12-29 | Payer: MEDICAID

## 2020-12-28 DIAGNOSIS — C9101 Acute lymphoblastic leukemia, in remission: Principal | ICD-10-CM

## 2020-12-28 LAB — PHOSPHORUS: PHOSPHORUS: 3.2 mg/dL (ref 2.4–5.1)

## 2020-12-28 LAB — CBC W/ AUTO DIFF
BASOPHILS ABSOLUTE COUNT: 0.2 10*9/L — ABNORMAL HIGH (ref 0.0–0.1)
BASOPHILS RELATIVE PERCENT: 2.5 %
EOSINOPHILS ABSOLUTE COUNT: 0.5 10*9/L (ref 0.0–0.5)
EOSINOPHILS RELATIVE PERCENT: 5.1 %
HEMATOCRIT: 38.1 % — ABNORMAL LOW (ref 39.0–48.0)
HEMOGLOBIN: 13.1 g/dL (ref 12.9–16.5)
LYMPHOCYTES ABSOLUTE COUNT: 2 10*9/L (ref 1.1–3.6)
LYMPHOCYTES RELATIVE PERCENT: 21.4 %
MEAN CORPUSCULAR HEMOGLOBIN CONC: 34.4 g/dL (ref 32.0–36.0)
MEAN CORPUSCULAR HEMOGLOBIN: 30.5 pg (ref 25.9–32.4)
MEAN CORPUSCULAR VOLUME: 88.7 fL (ref 77.6–95.7)
MEAN PLATELET VOLUME: 8 fL (ref 6.8–10.7)
MONOCYTES ABSOLUTE COUNT: 0.7 10*9/L (ref 0.3–0.8)
MONOCYTES RELATIVE PERCENT: 7.6 %
NEUTROPHILS ABSOLUTE COUNT: 6 10*9/L (ref 1.8–7.8)
NEUTROPHILS RELATIVE PERCENT: 63.4 %
PLATELET COUNT: 261 10*9/L (ref 150–450)
RED BLOOD CELL COUNT: 4.3 10*12/L (ref 4.26–5.60)
RED CELL DISTRIBUTION WIDTH: 17.5 % — ABNORMAL HIGH (ref 12.2–15.2)
WBC ADJUSTED: 9.5 10*9/L (ref 3.6–11.2)

## 2020-12-28 LAB — COMPREHENSIVE METABOLIC PANEL
ALBUMIN: 3.9 g/dL (ref 3.4–5.0)
ALKALINE PHOSPHATASE: 102 U/L (ref 46–116)
ALT (SGPT): 15 U/L (ref 10–49)
ANION GAP: 7 mmol/L (ref 5–14)
AST (SGOT): 11 U/L (ref <=34)
BILIRUBIN TOTAL: 0.2 mg/dL — ABNORMAL LOW (ref 0.3–1.2)
BLOOD UREA NITROGEN: 13 mg/dL (ref 9–23)
BUN / CREAT RATIO: 7
CALCIUM: 9.2 mg/dL (ref 8.7–10.4)
CHLORIDE: 110 mmol/L — ABNORMAL HIGH (ref 98–107)
CO2: 23 mmol/L (ref 20.0–31.0)
CREATININE: 1.95 mg/dL — ABNORMAL HIGH
EGFR CKD-EPI (2021) MALE: 44 mL/min/{1.73_m2} — ABNORMAL LOW (ref >=60)
GLUCOSE RANDOM: 87 mg/dL (ref 70–179)
POTASSIUM: 4 mmol/L (ref 3.4–4.8)
PROTEIN TOTAL: 6.7 g/dL (ref 5.7–8.2)
SODIUM: 140 mmol/L (ref 135–145)

## 2020-12-28 LAB — C-REACTIVE PROTEIN: C-REACTIVE PROTEIN: 44 mg/L — ABNORMAL HIGH (ref <=10.0)

## 2020-12-28 LAB — MAGNESIUM: MAGNESIUM: 1.8 mg/dL (ref 1.6–2.6)

## 2020-12-28 MED ADMIN — blinatumomab (BLINCYTO) 28 mcg/day in sodium chloride NON-PVC 0.9 % IVPB (7-DAY HOME INFUSION): 28 ug/d | INTRAVENOUS | @ 18:00:00 | Stop: 2021-01-04

## 2020-12-28 NOTE — Unmapped (Signed)
Labs drawn from PICC line by Rosiland Oz. And flushed with saline. Dressing was changed yesterday.

## 2020-12-28 NOTE — Unmapped (Unsigned)
Pt arrived for scheduled appointment. Staff replaced the batteries in the home infusion pump. Pt discharged home with family.

## 2020-12-29 ENCOUNTER — Telehealth
Admit: 2020-12-29 | Discharge: 2020-12-30 | Payer: MEDICAID | Attending: Infectious Disease | Primary: Infectious Disease

## 2020-12-29 ENCOUNTER — Telehealth: Admit: 2020-12-29 | Discharge: 2020-12-30 | Payer: MEDICAID

## 2020-12-29 DIAGNOSIS — D849 Immunodeficiency, unspecified: Principal | ICD-10-CM

## 2020-12-29 DIAGNOSIS — F331 Major depressive disorder, recurrent, moderate: Principal | ICD-10-CM

## 2020-12-29 DIAGNOSIS — J15212 Pneumonia due to Methicillin resistant Staphylococcus aureus: Principal | ICD-10-CM

## 2020-12-29 DIAGNOSIS — F419 Anxiety disorder, unspecified: Principal | ICD-10-CM

## 2020-12-29 DIAGNOSIS — A4902 Methicillin resistant Staphylococcus aureus infection, unspecified site: Principal | ICD-10-CM

## 2020-12-29 DIAGNOSIS — C9101 Acute lymphoblastic leukemia, in remission: Principal | ICD-10-CM

## 2020-12-29 DIAGNOSIS — Z79899 Other long term (current) drug therapy: Principal | ICD-10-CM

## 2020-12-29 DIAGNOSIS — J869 Pyothorax without fistula: Principal | ICD-10-CM

## 2020-12-29 NOTE — Unmapped (Signed)
Spoke w/ pt and conf appt for 10/14, pt was offer a sooner appt but declined.     12/29/20, @ 303. LK

## 2020-12-29 NOTE — Unmapped (Signed)
[facility] Clinical Assessment & Refill Coordination Note    Adam Keith, DOB: Jul 15, 1980  Phone: [phone] (home)     All above HIPAA information was verified with patient's family member, wife Adam Keith.     Was a Nurse, learning disability used for this call? No    Specialty Medication(s):   Hematology/Oncology: Iclusig     Current Outpatient Medications   Medication Sig Dispense Refill   ??? acetaminophen (TYLENOL) 325 MG tablet Take 650 mg by mouth every six (6) hours as needed for pain.      ??? amLODIPine (NORVASC) 10 MG tablet Take 1 tablet (10 mg total) by mouth daily. 30 tablet 0   ??? aspirin 81 MG chewable tablet Chew 1 tablet (81 mg total) daily. 36 tablet 11   ??? carvediloL (COREG) 12.5 MG tablet TAKE 1 TABLET BY MOUTH TWO TIMES A DAY. 180 tablet 1   ??? clonazePAM (KLONOPIN) 0.5 MG tablet Take 1 tablet (0.5 mg total) by mouth two (2) times a day as needed for anxiety or sleep. 60 tablet 1   ??? gabapentin (NEURONTIN) 300 MG capsule Take 1 capsule (300 mg total) by mouth Three (3) times a day. 270 capsule 6   ??? HYDROmorphone (DILAUDID) 2 MG tablet Take 1 tablet (2 mg total) by mouth every six (6) hours as needed for pain,moderate (4-6). 30 tablet 0   ??? hydrOXYzine (ATARAX) 25 MG tablet Take 1 tablet (25 mg total) by mouth every six (6) hours as needed. 60 tablet 1   ??? nortriptyline (PAMELOR) 50 MG capsule Take 2 capsules (100 mg total) by mouth nightly. 60 capsule 11   ??? OXYCONTIN 15 mg 12 hr crush resistant ER/CR tablet Take 1 tablet (15 mg total) by mouth Two (2) times a day. 56 tablet 0   ??? PONATinib (ICLUSIG) 30 mg tablet Take 1 tablet (30 mg total) by mouth daily. Swallow tablets whole. Do not crush, break, cut or chew tablets. 30 tablet 5   ??? sulfamethoxazole-trimethoprim (BACTRIM DS) 800-160 mg per tablet Take 1 tablet (160 mg of trimethoprim total) by mouth 2 times a day on Saturday, Sunday. For prophylaxis while on chemo. 48 tablet 3   ??? tedizolid 200 mg Tab Take 1 tablet (200 mg total) by mouth daily. 30 tablet 5   ??? valACYclovir (VALTREX) 500 MG tablet Take 1 tablet (500 mg total) by mouth daily. 90 tablet 11     No current facility-administered medications for this visit.        Changes to medications: Adam Keith reports no changes at this time.    Allergies   Allergen Reactions   ??? Bupropion Hcl Other (See Comments)     Per patient out of touch with reality, suicidal, homicidal   ??? Cefepime Rash     DRESS   ??? Ceftaroline Fosamil Rash and Other (See Comments)     Rash X 2 02/2020, suspected DRESS 06/2020   ??? 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 Onion Anaphylaxis   ??? Vancomycin Analogues      Hearing loss with Lasix  Other reaction(s): Other (See Comments)  Hearing loss  lasix   ??? Bismuth Subsalicylate Nausea And Vomiting   ??? Furosemide      With Vancomycin caused hearing loss  Other reaction(s): Other (See Comments)  With Vancomycin caused hearing loss       Changes to allergies: No    SPECIALTY MEDICATION ADHERENCE  Iclusig 30 mg: about 10 days of medicine on hand   Sivextro 200 mg: unsure how many days of medicine on hand     Medication Adherence    Patient reported X missed doses in the last month: 0  Specialty Medication: Sivextro 200 mg  Patient is on additional specialty medications: Yes  Additional Specialty Medications: Iclusig 30 mg   Patient Reported Additional Medication X Missed Doses in the Last Month: 0  Patient is on more than two specialty medications: No  Informant: spouse  Confirmed plan for next specialty medication refill: delivery by pharmacy  Refills needed for supportive medications: not needed          Specialty medication(s) dose(s) confirmed: Patient reports changes to the regimen as follows: Stopping Sivextro once completes current supply     Are there any concerns with adherence? No    Adherence counseling provided? Not needed    CLINICAL MANAGEMENT AND INTERVENTION      Clinical Benefit Assessment:    Do you feel the medicine is effective or helping your condition? Yes    Clinical Benefit counseling provided? Not needed    Adverse Effects Assessment:    Are you experiencing any side effects? No    Are you experiencing difficulty administering your medicine? No    Quality of Life Assessment:    Quality of Life    Rheumatology  Oncology  1. What impact has your specialty medication had on the reduction of your daily pain or discomfort level?: Tremendous  2. On a scale of 1-10, how would you rate your ability to manage side effects associated with your specialty medication? (1=no issues, 10 = unable to take medication due to side effects): 1  Dermatology  Cystic Fibrosis          How many days over the past month did your Acute lymphoblastic leukemia in remission  keep you from your normal activities? For example, brushing your teeth or getting up in the morning. 0    Have you discussed this with your provider? Not needed    Acute Infection Status:    Acute infections noted within Epic:  No active infections  Patient reported infection: None    Therapy Appropriateness:    Is therapy appropriate and patient progressing towards therapeutic goals? Yes, therapy is appropriate and should be continued    DISEASE/MEDICATION-SPECIFIC INFORMATION      N/A    PATIENT SPECIFIC NEEDS     - Does the patient have any physical, cognitive, or cultural barriers? No    - Is the patient high risk? Yes, patient is taking oral chemotherapy. Appropriateness of therapy as been assessed    - Does the patient require a Care Management Plan? No     - Does the patient require physician intervention or other additional services (i.e. nutrition, smoking cessation, social work)? No      SHIPPING     Specialty Medication(s) to be Shipped:   Hematology/Oncology: Iclusig    Other medication(s) to be shipped: No additional medications requested for fill at this time     Changes to insurance: No    Delivery Scheduled: Yes, Expected medication delivery date: 01/05/21.     Medication will be delivered via UPS to the confirmed prescription address in [facility].    The patient will receive a drug information handout for each medication shipped and additional FDA Medication Guides as required.  Verified that patient has previously received a Conservation officer, historic buildings and a Notice of  Chief Technology Officer.    The patient or caregiver noted above participated in the development of this care plan and knows that they can request review of or adjustments to the care plan at any time.      All of the patient's questions and concerns have been addressed.    Breck Coons [facility] Specialty Pharmacist

## 2020-12-29 NOTE — Unmapped (Signed)
Pt confirmed MyChart visit and meds  Provider is On campus

## 2020-12-29 NOTE — Unmapped (Signed)
[facility]  Psychiatry   Established Patient E&M Service - Outpatient       Assessment:    Adam Keith presents for follow-up evaluation. He states that his depression and anxiety have improved slightly at the 100 mg dose of nortriptyline. Depression and anxiety remain a problem. Patient currently receiving blinatumomab infusion at home and patient wants to see how he feels after he completes his treatment before making any dose changes. Patient continues to require clonazepam at bedtime. Will reassess on 02/05/21.    Identifying Information:  Adam [name] with a history of PH+ B cell ALL, major depressive disorder, PTSD, anxiety, and insomnia.??He has a long struggle with intermittent depressive episodes with one suicide attempt at age 76.??    Risk Assessment:  An assessment of suicide and violence risk factors was performed as part of this evaluation and is not significantly changed from the last visit.   While future psychiatric events cannot be accurately predicted, the patient does not currently require acute inpatient psychiatric care and does not currently meet [facility] involuntary commitment criteria.      Plan:    Problem 1:??Major Depressive Disorder  Status of problem:??chronic with moderate??exacerbation  Interventions:??  ?? Continue??nortriptyline 100 mg??at bedtime. He started this dose on 12/08/20 after completing his linezolid.  ?? Monitor nortriptyline level given elevated creatinine levels. Nortriptyline level drawn on 12/21/20 was 66 (Ref range 70-170).    ?? Continue psychotherapy with local therapist.??Encouraged patient to reconnect, which he has agreed to do. Recently, patient has been too sick to engage with his therapist.    Problem 2:??Anxiety  Status of problem:??chronic??with moderate exacerbation  Interventions:??  ?? See nortriptyline plan above  ?? OK to continue clonazepam??0.5??mg tablets??BID PRN. Patient states that he has only been using this medication at bedtime. Last filled on 12/12/20 per PDMP review.  ??  Problem 3:??Insomnia  Status of problem:?? chronic with mild to moderate exacerbation  Interventions:??  ?? See nortriptyline and clonazepam plans above  ?? Education??re: sleep hygiene      Psychotherapy provided:  No billable psychotherapy service provided.    Patient has been given this writer's contact information as well as the [facility] Psychiatry urgent line number. The patient has been instructed to call 911 for emergencies.      Subjective:    Chief complaint:  Follow-up psychiatric evaluation for depression and anxiety    Interval History:   Patient upset about his oncologist leaving [place] at the beginning of November. States that [name] saved his life and he is fearful of losing him.     Ongoing issue with anxiety and depression. No difficulty tolerating the nortriptyline 100 mg dose. Patient currently getting continuous immunotherapy and the bag will not be taken down until 10/12. Patient states that when he is getting treatment, he doesn't feel well, so he doesn't feel like he can fully assess how he is feeling at the nortriptyline 100 mg dose. He asked that we reassess in 3-4 weeks after completing his last treatment.  He continues to use clonazepam daily.    Sleep remains disrupted. Explains that he cannot sleep in the best position because of the continue immunotherapy treatment bag keeping him from getting comfortable. Waking up 4-5 times a night, but no difficulty getting back to sleep.     Struggling with ongoing weakness and fatigue. Patient finding it difficult to do his work.     Prior Psychiatric Medication Trials: bupropion (  could not tolerate), duloxetine, mirtazapine (given during recent hospitalization), lorazepam    Psychiatric Review of Systems  ?? Depressive Symptoms:??depressed mood, difficulty concentrating, fatigue, hopelessness at times and insomnia  ?? Manic Symptoms:??N/A  ?? Psychosis Symptoms:??N/A  ?? Anxiety Symptoms:??excessive anxiety and worry, difficulty controlling worry, restlessness, easily fatigued, difficulty concentrating, irritability, muscle tension, difficulty staying asleep  ?? Panic Symptoms:??denies  ?? PTSD Symptoms:??no recent issues  ?? Sleep disturbance:??frequent night time awakening   ?? Appetite:??good  ?? Exercise:??Unable to engage in an exercise routine.??      Objective:      Mental Status Exam:  Appearance:    Appears stated age, Well nourished and Clean/Neat   Motor:   No abnormal movements   Speech/Language:    Normal rate, volume, tone, fluency   Mood:   Anxious   Affect:   Anxious and Cooperative   Thought process and Associations:   Logical, linear, clear, coherent, goal directed   Abnormal/psychotic thought content:     Denies SI, HI, self harm, delusions, obsessions, paranoid ideation, or ideas of reference   Perceptual disturbances:     Denies auditory and visual hallucinations, behavior not concerning for response to internal stimuli     Other:   insight and judgment intact     I personally spent 35 minutes face-to-face and non-face-to-face in the care of this patient, which includes all pre, intra, and post visit time on the date of service.    Visit was completed by video (or phone) and the appropriate disclaimer has been included below.    The patient reports they are currently: at home. I spent 20 minutes on the real-time audio and video with the patient on the date of service. I spent an additional 15 minutes on pre- and post-visit activities on the date of service.     The patient was physically located in [place] or a state in which I am permitted to provide care. The patient and/or parent/guardian understood that s/he may incur co-pays and cost sharing, and agreed to the telemedicine visit. The visit was reasonable and appropriate under the circumstances given the patient's presentation at the time.    The patient and/or parent/guardian has been advised of the potential risks and limitations of this mode of treatment (including, but not limited to, the absence of in-person examination) and has agreed to be treated using telemedicine. The patient's/patient's family's questions regarding telemedicine have been answered.     If the visit was completed in an ambulatory setting, the patient and/or parent/guardian has also been advised to contact their provider???s office for worsening conditions, and seek emergency medical treatment and/or call 911 if the patient deems either necessary.      Sheran Lawless, PMHNP  12/29/2020

## 2020-12-29 NOTE — Unmapped (Signed)
Comprehensive Cancer Support Program (CCSP) - Psychiatry Outpatient Clinic   After Visit Summary    It was a pleasure to see you today in the [facility]???s Comprehensive Cancer Support Program (CCSP). The CCSP is a multidisciplinary program dedicated to helping patients, caregivers, and families with cancer treatment, recovery and survivorship.      To schedule, cancel, or change your appointment:  Please call the [facility] schedulers at [phone], Monday through Friday 8AM - 5PM.  Someone will return your call within 24 hours.      If you have a question about your medicines or you need to contact your provider:  Please call the CCSP program coordinator, Myrene Galas, at [phone].     For after hours urgent issues, you may call [phone] or call the I need to talk line at [phone] anytime 24/7.    CCSP Patient and Family Resource Center: [phone].    CCSP Website:  [email or web address]    For prescription refills, please allow at least 24 hours (during business hours, M-F) for providers to call in refills to your pharmacy. We are generally unable to accommodate same-day requests for refills.     If you are taking any controlled substances (such as anxiety or sleep medications), you must use them as the directions say to use them. We generally do not provide early refills over the phone without clear reason, and it would be inappropriate to obtain the medications from other doctors. We routinely use the [place] controlled substance database to monitor prescription drug use.

## 2020-12-29 NOTE — Unmapped (Signed)
IMMUNOCOMPROMISED HOST INFECTIOUS DISEASE PROGRESS NOTE      The patient reports they are currently: at home. I spent 10 minutes on the real-time audio and video visit with the patient on the date of service. I spent an additional 25 minutes on pre- and post-visit activities on the date of service.     The patient was not located and I was located within 250 yards of a hospital based location during the real-time audio and video visit. The patient was physically located in [place] or a state in which I am permitted to provide care. The patient and/or parent/guardian understood that s/he may incur co-pays and cost sharing, and agreed to the telemedicine visit. The visit was reasonable and appropriate under the circumstances given the patient's presentation at the time.    The patient and/or parent/guardian has been advised of the potential risks and limitations of this mode of treatment (including, but not limited to, the absence of in-person examination) and has agreed to be treated using telemedicine. The patient's/patient's family's questions regarding telemedicine have been answered.    If the visit was completed in an ambulatory setting, the patient and/or parent/guardian has also been advised to contact their provider???s office for worsening conditions, and seek emergency medical treatment and/or call 911 if the patient deems either necessary.      Assessment/Plan:     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 krusei fungemia  - Prior chemotherapy:??GRAAPPH-2005??induction??with dasatinib??(vincristine, dexamethasone and dasatinib);??C1D1 01/24/2020; difficulty tolerating single-agent dasatinib  - Current chemotherapy:??blinatumomab (anti-CD19/CD3) + ponatinib (TKI)??s/p??C1 06/2020; cycle 4??of 5 12/14/20; plan for??one IT treatment per cycle next due 01/11/21  - 07/23/20 BMBx normocellular bone marrow 50% w/ 1% blasts (MRD)  ??  Pertinent Co-morbidities  # COPD/empysema  #??DIHS/DRESS??ceftaroline??06/12/2020, relapse??after cefepime??11/02/2020, relapsed after steroid withdrawal 12/29/20  -????followed by Dr. Caryn Section, dermatology  - per prior ID notes possible culprits ceftaroline,??posaconazole, dasatinib, sotrovimab  - 8/1 increased pred from 15 ->??20mg  daily with resolution of rash -> discontinued 12/14/20  ????  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  Infection History  Active infections:??  #MRSA??RLL PNA complicated by??bacteremia, 11/01/2020  #Possible right empyema 11/13/2020, s/p failed thoracentesis 11/20/20  #RML opacity, presumed relapsed MRSA pneumonia 11/18/2020  - 8/19 Failed??thoracocentesis by IP  - 9/26 CT chest parenchyma without e/o infection, persistent but decreased loculated effusions on right  Rx: 7/31 linezolid/cefepime??-->??linezolid/daptomycin/cefepime -->8/02 linezolid/daptomycin??-> 8/9 dapto -> 8/12 linezolid+dapto??-> 8/17 linezolid-->8/31 tedizolid    #Persistent right chest pain 12/29/20  ??  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  Antimicrobial Intolerance/allergy  Cefepime - DRESS  Ceftaroline -??DRESS  Dapsone - possible??agranulocytosis, per chart anaphylaxis  Vancomycin - probable ototoxicity (in combination??with furosemide)  Isavuconazole - elevated LFTs in the setting of TKI      RECOMMENDATIONS    Diagnostic  ???  Referral to thoracic surgery for persistent right chest pain; I am very worried about relapsed MRSA infection in the pleural space when we stop antibiotics so I think we should establish care with thoracic surgery    Treatment  ??? Continue tedizolid until 11/04/20, then we will monitor clinically for relapse    Monitoring for antimicrobial toxicities  Adam Keith is currently receiving drug therapy requiring intensive lab monitoring for toxicity.  ??? CBC w diff q1-2 weeks    Prophylaxis  ??? Continue valacyclovir and TMP/SMX as needed    Follow up PRN, his wife reaches out if problems arise        Recommendations were communicated via shared medical record.    Subjective     Interval History:    Neuropathy better in hands and worse in feet.  No vision problems   Right rib pain is worse, exacerbated by cough and deep breaths  Fever ~100 last Friday and felt poorly then felt ok the next day.  No thrush, diarrhea  Cough stable  Rash and itching is back after stopping prednisone    Medications:  Antimicrobials: tedizolid, tmp/smx, valacyclovir  Prior/Current immunomodulators: ponatinib, blinatumomab   Other medications reviewed.    Objective     Physical Exam:    Const []  vital signs above    [x]  NAD, non-toxic appearance        Eyes [x]  Lids normal bilaterally, conjunctiva anicteric and noninjected OU     [] PERRL  [] EOMI        ENMT [x]  Normal appearance of external nose and ears, no nasal discharge        [x]  MMM, no lesions on lips or gums [x]  No thrush, leukoplakia, oral lesions  []  Dentition good []  Edentulous []  Dental caries present  []  Hearing normal  []  TMs with good light reflexes bilaterally         Neck [x]  Neck of normal appearance and trachea midline        []  No thyromegaly, nodules, or tenderness []  Full neck ROM        Lymph []  No LAD in neck     []  No LAD in supraclavicular area     []  No LAD in axillae   []  No LAD in epitrochlear chains     []  No LAD in inguinal areas        CV []  RRR            []  No peripheral edema     []  Pedal pulses intact   []  No abnormal heart sounds appreciated   []  Extremities WWP   Right PICC line c/d/i      Resp [x]  Normal WOB at rest    [x]  No breathlessness with speaking, no coughing  []  CTA anteriorly    []  CTA bilaterally          GI [x]  Normal inspection, ND   []  NABS     []  No umbilical hernia on exam       []  No hepatosplenomegaly     []  Inspection of perineal and perianal areas normal        GU []  Normal external genitalia     [] No urinary catheter present in urethra   []  No CVA tenderness    []  No tenderness over renal allograft        MSK []  No clubbing or cyanosis of hands       []  No vertebral point tenderness  []  No  focal tenderness or abnormalities on palpation of joints in RUE, LUE, RLE, or LLE  Focal point tenderness at right flank between ribs, no visible lesion on exam      Skin []  No rashes, lesions, or ulcers of visualized skin     []  Skin warm and dry to palpation   Mild erythema throughout      Neuro []  Face expression symmetric  []  Sensation to light touch grossly intact throughout    []  Moves extremities equally    []  No tremor noted        []  CNs II-XII grossly intact     []  DTRs normal and symmetric throughout []  Gait unremarkable        Psych [x]  Appropriate affect       [x]  Fluent speech         [x]  Attentive, good eye contact  []  Oriented to person, place, time          []  Judgment and insight are appropriate             Labs:  Recent Labs   Lab Units 12/28/20  1254   WBC 10*9/L 9.5   HEMOGLOBIN g/dL 16.1   PLATELET COUNT (1) 10*9/L 261   NEUTRO ABS 10*9/L 6.0   LYMPHO ABS 10*9/L 2.0   EOSINO ABS 10*9/L 0.5   BUN mg/dL 13   CREATININE mg/dL 0.96*   AST U/L 11   ALT U/L 15   BILIRUBIN TOTAL mg/dL 0.2*   ALK PHOS U/L 045   POTASSIUM mmol/L 4.0 MAGNESIUM mg/dL 1.8   PHOSPHORUS mg/dL 3.2   CALCIUM mg/dL 9.2   CRP mg/L 40.9*     I reviewed and noted the following labs: wbc nl, CRP up, Cr stable    Microbiology:  No new micro    Imaging:  Recent Studies      CT Chest Wo Contrast    Result Date: 12/28/2020  EXAM: CT CHEST WO CONTRAST DATE: 12/28/2020 3:06 PM ACCESSION: [id number] UN DICTATED: 12/28/2020 3:07 PM INTERPRETATION LOCATION: [facility] CLINICAL INDICATION: 40 years old Male with f/u pneumonia ; Pneumonia, unresolved  - J15.212 - Pneumonia due to methicillin resistant Staphylococcus aureus (MRSA), unspecified laterality, unspecified part of lung (CMS - HCC) - R91.8 - Right middle lobe pulmonary infiltrate  TECHNIQUE: Contiguous noncontrast axial images were reconstructed through the chest following a single breath hold helical acquisition.  Images were reformatted in the axial and sagittal planes. MIP slabs were also constructed. COMPARISON: CT chest dated 02 December 2020. FINDINGS: LUNGS AND AIRWAYS: Moderate-to-advanced centrilobular emphysema. Scattered irregular linear opacities in the right lung may represent scarring and/or subsegmental atelectasis. No airspace consolidations. The central airways are patent. PLEURA. AND DIAPHRAGM: Similar to slight decreased size of small inferolaterally loculated right pleural effusion and decreased size of intrafissural loculated component. No pneumothorax. Normal diaphragm. MEDIASTINUM AND LYMPH NODES: No enlarged intrathoracic lymph nodes. Esophagus is normal. HEART AND PERICARDIUM: Heart is normal in size. Extensive left ventricular myocardial calcifications. Mild coronary artery calcifications. No pericardial effusion. VASCULATURE: Aorta is normal in caliber. Main pulmonary artery is normal in size.  BONES AND CHEST WALL: Normal visualized thoracic skeleton. No chest wall abnormality. UPPER ABDOMEN: Imaged upper abdomen is normal. OTHER: No actionable thyroid nodule. MEDICAL DEVICES: Right upper extremity PICC terminates at the level of the mid superior vena cava.     *Persistent, but decreased size of loculated inferolateral right pleural effusion. *Scattered areas of linear opacification the right lung due to scarring or  subsegmental atelectasis. *Moderate-to-advanced centrilobular emphysema. *Left ventricular myocardial calcifications.       Independent visualization of images: I independently reviewed the image from 9/26 and I agree with the findings/interpretation. Loculated effusions on right.

## 2020-12-30 DIAGNOSIS — T50905A Adverse effect of unspecified drugs, medicaments and biological substances, initial encounter: Principal | ICD-10-CM

## 2020-12-30 DIAGNOSIS — D7212 DRESS syndrome: Principal | ICD-10-CM

## 2020-12-30 MED ORDER — PREDNISONE 10 MG TABLET
ORAL_TABLET | Freq: Every day | ORAL | 0 refills | 30.00000 days | Status: CP
Start: 2020-12-30 — End: ?

## 2020-12-30 NOTE — Unmapped (Signed)
Patient declined tomorrow's cancellation.    Patient scheduled for 10/12 at 10:30 am in [place]. There is no urgent clinic for 10/05    Thanks!

## 2020-12-30 NOTE — Unmapped (Signed)
Patient wants to keep his 11/28 appointment    Thanks!

## 2020-12-31 DIAGNOSIS — T50905A Adverse effect of unspecified drugs, medicaments and biological substances, initial encounter: Principal | ICD-10-CM

## 2020-12-31 DIAGNOSIS — D7212 DRESS syndrome: Principal | ICD-10-CM

## 2020-12-31 MED ORDER — PREDNISONE 10 MG TABLET
ORAL_TABLET | Freq: Every day | ORAL | 0 refills | 30.00000 days
Start: 2020-12-31 — End: ?

## 2021-01-01 MED ORDER — PREDNISONE 10 MG TABLET
ORAL_TABLET | Freq: Every day | ORAL | 0 refills | 30.00000 days | Status: CP
Start: 2021-01-01 — End: ?

## 2021-01-04 ENCOUNTER — Ambulatory Visit: Admit: 2021-01-04 | Discharge: 2021-01-05 | Payer: MEDICAID

## 2021-01-04 ENCOUNTER — Other Ambulatory Visit: Admit: 2021-01-04 | Discharge: 2021-01-05 | Payer: MEDICAID

## 2021-01-04 DIAGNOSIS — C9101 Acute lymphoblastic leukemia, in remission: Principal | ICD-10-CM

## 2021-01-04 LAB — CBC W/ AUTO DIFF
BASOPHILS ABSOLUTE COUNT: 0.4 10*9/L — ABNORMAL HIGH (ref 0.0–0.1)
BASOPHILS RELATIVE PERCENT: 3.3 %
EOSINOPHILS ABSOLUTE COUNT: 0.5 10*9/L (ref 0.0–0.5)
EOSINOPHILS RELATIVE PERCENT: 5 %
HEMATOCRIT: 40.2 % (ref 39.0–48.0)
HEMOGLOBIN: 13.9 g/dL (ref 12.9–16.5)
LYMPHOCYTES ABSOLUTE COUNT: 2.6 10*9/L (ref 1.1–3.6)
LYMPHOCYTES RELATIVE PERCENT: 24.1 %
MEAN CORPUSCULAR HEMOGLOBIN CONC: 34.6 g/dL (ref 32.0–36.0)
MEAN CORPUSCULAR HEMOGLOBIN: 30.7 pg (ref 25.9–32.4)
MEAN CORPUSCULAR VOLUME: 88.6 fL (ref 77.6–95.7)
MEAN PLATELET VOLUME: 8.1 fL (ref 6.8–10.7)
MONOCYTES ABSOLUTE COUNT: 0.8 10*9/L (ref 0.3–0.8)
MONOCYTES RELATIVE PERCENT: 7.4 %
NEUTROPHILS ABSOLUTE COUNT: 6.4 10*9/L (ref 1.8–7.8)
NEUTROPHILS RELATIVE PERCENT: 60.2 %
PLATELET COUNT: 269 10*9/L (ref 150–450)
RED BLOOD CELL COUNT: 4.54 10*12/L (ref 4.26–5.60)
RED CELL DISTRIBUTION WIDTH: 17.1 % — ABNORMAL HIGH (ref 12.2–15.2)
WBC ADJUSTED: 10.7 10*9/L (ref 3.6–11.2)

## 2021-01-04 LAB — COMPREHENSIVE METABOLIC PANEL
ALBUMIN: 4 g/dL (ref 3.4–5.0)
ALKALINE PHOSPHATASE: 104 U/L (ref 46–116)
ALT (SGPT): 15 U/L (ref 10–49)
ANION GAP: 9 mmol/L (ref 5–14)
AST (SGOT): 15 U/L (ref <=34)
BILIRUBIN TOTAL: 0.2 mg/dL — ABNORMAL LOW (ref 0.3–1.2)
BLOOD UREA NITROGEN: 12 mg/dL (ref 9–23)
BUN / CREAT RATIO: 6
CALCIUM: 9.2 mg/dL (ref 8.7–10.4)
CHLORIDE: 108 mmol/L — ABNORMAL HIGH (ref 98–107)
CO2: 21 mmol/L (ref 20.0–31.0)
CREATININE: 1.98 mg/dL — ABNORMAL HIGH
EGFR CKD-EPI (2021) MALE: 43 mL/min/{1.73_m2} — ABNORMAL LOW (ref >=60)
GLUCOSE RANDOM: 113 mg/dL (ref 70–179)
POTASSIUM: 4.2 mmol/L (ref 3.4–4.8)
PROTEIN TOTAL: 6.7 g/dL (ref 5.7–8.2)
SODIUM: 138 mmol/L (ref 135–145)

## 2021-01-04 LAB — MAGNESIUM: MAGNESIUM: 1.7 mg/dL (ref 1.6–2.6)

## 2021-01-04 LAB — PHOSPHORUS: PHOSPHORUS: 3.5 mg/dL (ref 2.4–5.1)

## 2021-01-04 MED FILL — ICLUSIG 30 MG TABLET: ORAL | 30 days supply | Qty: 30 | Fill #1

## 2021-01-04 NOTE — Unmapped (Signed)
Labs drawn off of CVC by RN Eliezer Champagne. Dressing and claves changed. Line flushed and heparin-locked, labs sent for analysis.

## 2021-01-04 NOTE — Unmapped (Signed)
Pt arrived to room 28. No complaints. PICC already in with blood return. Pt tolerated treatment with no complaints. AVS declined. Pt discharged to home with CADD pump running.

## 2021-01-07 NOTE — Unmapped (Signed)
I have contacted the patient to review/clarify the following:    ?? I was able to confirm that our team has completed a series of cancer claim forms associated with [name] that pertain to admissions on the following dates:  ?? 01/20/2020 through 04/01/2020  ?? 06/19/2020 through 06/27/2020  ?? 07/17/2020 through 07/21/2020  ?? 08/10/2020 through 08/12/2020  ?? 11/01/2020 through 11/10/2020  ?? 11/17/2020 through 11/22/2020    ?? The original version of the completed claim forms are archived in the media section of the electronic medical record.    ?? A copy of the completed claim forms have been shared with the patient via a [name] message as requested.    ?? Adam Keith has indicated that he will share the completed claim forms along with other supporting documents with [employer] [place].    ?? Repeat bone marrow biopsy and lumbar puncture remains tentatively set next for 01/11/2021.    ?? Follow-up with [name] the clinic setting remains set next on 01/13/2021.    Otherwise, the patient expressed verbal understanding and had no other questions or concerns at this time.  He is amenable to the plan outlined above and will contact clinic back sooner should he have any other issues.    No other actions taken and this updates been shared with [name] from the Leukemia Care Team.

## 2021-01-08 MED ORDER — LORAZEPAM 1 MG TABLET
ORAL_TABLET | Freq: Once | ORAL | 0 refills | 1 days | Status: CP
Start: 2021-01-08 — End: 2021-01-08

## 2021-01-11 ENCOUNTER — Other Ambulatory Visit: Admit: 2021-01-11 | Discharge: 2021-01-12 | Payer: MEDICAID

## 2021-01-11 ENCOUNTER — Ambulatory Visit: Admit: 2021-01-11 | Discharge: 2021-01-12 | Payer: MEDICAID

## 2021-01-11 ENCOUNTER — Encounter: Admit: 2021-01-11 | Discharge: 2021-01-12 | Payer: MEDICAID | Attending: Adult Health | Primary: Adult Health

## 2021-01-11 DIAGNOSIS — C9101 Acute lymphoblastic leukemia, in remission: Principal | ICD-10-CM

## 2021-01-11 LAB — CBC W/ AUTO DIFF
BASOPHILS ABSOLUTE COUNT: 0.2 10*9/L — ABNORMAL HIGH (ref 0.0–0.1)
BASOPHILS RELATIVE PERCENT: 1.7 %
EOSINOPHILS ABSOLUTE COUNT: 0.6 10*9/L — ABNORMAL HIGH (ref 0.0–0.5)
EOSINOPHILS RELATIVE PERCENT: 5.7 %
HEMATOCRIT: 40 % (ref 39.0–48.0)
HEMOGLOBIN: 13.7 g/dL (ref 12.9–16.5)
LYMPHOCYTES ABSOLUTE COUNT: 2.2 10*9/L (ref 1.1–3.6)
LYMPHOCYTES RELATIVE PERCENT: 22.8 %
MEAN CORPUSCULAR HEMOGLOBIN CONC: 34.2 g/dL (ref 32.0–36.0)
MEAN CORPUSCULAR HEMOGLOBIN: 30.1 pg (ref 25.9–32.4)
MEAN CORPUSCULAR VOLUME: 88 fL (ref 77.6–95.7)
MEAN PLATELET VOLUME: 8.4 fL (ref 6.8–10.7)
MONOCYTES ABSOLUTE COUNT: 0.9 10*9/L — ABNORMAL HIGH (ref 0.3–0.8)
MONOCYTES RELATIVE PERCENT: 9 %
NEUTROPHILS ABSOLUTE COUNT: 6 10*9/L (ref 1.8–7.8)
NEUTROPHILS RELATIVE PERCENT: 60.8 %
PLATELET COUNT: 224 10*9/L (ref 150–450)
RED BLOOD CELL COUNT: 4.55 10*12/L (ref 4.26–5.60)
RED CELL DISTRIBUTION WIDTH: 16.6 % — ABNORMAL HIGH (ref 12.2–15.2)
WBC ADJUSTED: 9.8 10*9/L (ref 3.6–11.2)

## 2021-01-11 MED ORDER — NORTRIPTYLINE 50 MG CAPSULE
ORAL_CAPSULE | Freq: Every evening | ORAL | 11 refills | 30 days | Status: CP
Start: 2021-01-11 — End: 2022-01-11

## 2021-01-11 MED ORDER — OXYCONTIN 15 MG TABLET,CRUSH RESISTANT,EXTENDED RELEASE
ORAL_TABLET | Freq: Two times a day (BID) | ORAL | 0 refills | 28.00000 days | Status: CP
Start: 2021-01-11 — End: ?

## 2021-01-11 MED ADMIN — methotrexate (Preservative Free) 12 mg, hydrocortisone sod succ (Solu-CORTEF) 50 mg in sodium chloride (NS) 0.9 % 4.52 mL INTRATHECAL syringe: INTRATHECAL | @ 15:00:00 | Stop: 2021-01-11

## 2021-01-11 MED ADMIN — HYDROmorphone (DILAUDID) injection 1 mg: 1 mg | INTRAVENOUS | @ 14:00:00 | Stop: 2021-01-11

## 2021-01-11 NOTE — Unmapped (Unsigned)
Date of Service: 01/11/2021      Patient Active Problem List   Diagnosis   ??? Tobacco use disorder   ??? AKI (acute kidney injury) (CMS-HCC)   ??? Transaminitis   ??? 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 Hyperphosphatemia   ??? MRSA bacteremia   ??? Septic shock due to Staphylococcus aureus (CMS-HCC)   ??? Hyperkalemia   ??? Hiccups   ??? Hypernatremia   ??? Red blood cell antibody positive   ??? Hypokalemia   ??? Hypomagnesemia   ??? COVID-19   ??? Dyspnea   ??? Moderate episode of recurrent major depressive disorder (CMS-HCC)   ??? PTSD (post-traumatic stress disorder)   ??? Anxiety   ??? Insomnia   ??? Fever   ??? Immunocompromised (CMS-HCC)   ??? Hypogammaglobulinemia (CMS-HCC)   ??? ALL (acute lymphoblastic leukemia) (CMS-HCC)   ??? Respiratory failure with hypoxia (CMS-HCC)   ??? Pneumonia   ??? Chronic pain   ??? Acute kidney injury superimposed on CKD (CMS-HCC)   ??? COPD (chronic obstructive pulmonary disease) (CMS-HCC)   ??? Acute lymphoblastic leukemia in remission (CMS-HCC)   ??? Incarcerated umbilical hernia   ??? Late effect of fracture of multiple bones   ??? Leukocytosis   ??? Low back pain   ??? Migraine   ??? Obesity   ??? Pain in joint involving ankle and foot   ??? Panic attacks   ??? Tear of medial meniscus of knee       Indication:    Diagnosis ICD-10-CM Associated Orders   1. Acute lymphoblastic leukemia in remission (CMS-HCC)  C91.01 Hematopathology Order     Hematopathology Order     Cytogenetics Cancer/FISH NON-BLOOD     Cytogenetics Cancer/FISH NON-BLOOD     BCR/ABL1 p210 Bone Marrow     BCR/ABL1 p210 Bone Marrow     DNA Extract and Hold     DNA Extract and Hold     B-ALL Minimal Residual Disease (MRD), Flow Cytometry, Bone Marrow     B-ALL Minimal Residual Disease (MRD), Flow Cytometry, Bone Marrow     HYDROmorphone (DILAUDID) injection 1 mg     lidocaine (XYLOCAINE) 20 mg/mL (2 %) injection 10 mL   2. Tobacco use disorder  F17.200        Premedication: Dilauded 1mg  IV  Driver confirmed: yes    Ordering Provider: Berline Lopes, MD    Clinician(s) Performing Procedure: Reshard Guillet K. Thera Flake, PA-C        Bone Marrow Aspirate and Biopsy, leftt side    The procedure risks and alternatives of the procedure were explained to the patient.  The patient verbalized understanding and signed informed consent. After a time-out in which his patient identifiers were checked by 2 providers, the patient was laid in prone position on the table.   The  posterior superior iliac spine and iliac crest were cleaned, prepped and draped in the usual sterile fashion.     Anesthetic agent used: 2% plain lidocaine.      Utilizing a Ranfac needle, a bone marrow aspiration and biopsy was performed.  Specimen was sent for routine histopathologic stains and sectioning, flow cytometry, cytogenetics and molecular analysis.     A pressure dressing was applied to the biopsy site.  Patient tolerated the procedure well.  Hemostasis was confirmed upon discharge.     The patient was given verbal instructions for wound care, such as to keep the biopsy site dry, covered for 24 hours, and to call your  physician for a temperature > 100.5.  Tylenol may be taken for discomfort.    Specimens Collected:  EDTA x 2  Heparin x 2  Core biopsy x 1

## 2021-01-11 NOTE — Unmapped (Unsigned)
Pt moved to room 9. Had LP tolerated well. Patient stayed in room for one hour on his back resting after LP was completed. Patient stated he felt good and was ready to go home. AVS refused will use my chart. Patient left floor in no acute distress with wife.

## 2021-01-11 NOTE — Unmapped (Signed)
Lumbar Puncture Procedure (CPT N7149739) with ultrasound guidance (CPT [phone]) with Intrathecal Chemo Administration (CPT [phone])  Note for billing: Do not include CPT 62270 if intrathecal chemo is given. Please use CPT 96450, which includes LP, and add CPT [phone] if ultrasound is documented.    Pre-procedural Planning     Patient Name:: Adam Keith  Patient MRN: [id number]    Pre-operative Diagnosis:  Administration of intrathecal chemo    Post-operative Diagnosis: Administration of intrathecal chemo    Indications:  Administration of intrathecal chemo    Contraindications to performing lumbar puncture: Patient had no contraindications. Specifically, no local skin infections over the proposed puncture site, known elevated ICP other than in idiopathic intracranial hypertension or cryptococcal meningitis, and/or an uncontrolled bleeding diathesis.     Imaging prior to lumbar puncture: Generally, imaging should be obtained prior to performing a lumbar puncture if the patient is > 58 years old, immunocompromised, has had a seizure within one week of presentation, has an abnormal level of consciousness, or an abnormal neurologic exam (including papilledema). Patient has no indication for CNS imaging prior to proceding with lumbar puncture.    Known Bleeding Diathesis: Patient/caregiver denies any known bleeding or platelet disorder.     Antiplatelet Agents: This patient is not on an antiplatelet agent.    Systemic Anticoagulation: This patient is not on full systemic anticoagulation.    Significant Labs:  INR   Date Value Ref Range Status   12/01/2020 0.84  Final     PT   Date Value Ref Range Status   12/01/2020 9.8 (L) 10.3 - 13.4 sec Final     APTT   Date Value Ref Range Status   04/15/2020 38.0 (H) 24.9 - 36.9 sec Final     Platelet   Date Value Ref Range Status   01/11/2021 224 150 - 450 10*9/L Final       Consent: Informed consent was obtained after explanation of the risks (including pain, headache, backache, bleeding that could result in paralysis, and infection) and benefits of the procedure. Refer to the consent documentation.    Procedure Details     Time-out performed immediately prior to the procedure. If IT chemo given, 2 providers checked chemo orders prior to beginning procedure.    Patient was placed in the sitting position with hips and neck in flexion.    The superior aspect of the iliac crests were identified, with the traverse demarcating the L4-L5 interspace.  A bedside ultrasound was used to help identify the spinous processes at L2, L3 and L4 and the intervertebral spaces were located and marked.              This area was prepped with chlorhexidine and draped in the usual sterile fashion. Sterile technique was used including antiseptics, cap, gloves, hand hygiene, mask, and sterile sheet.  Local anesthesia with 1% lidocaine was applied subcutaneously then deep to the skin. The 20 gauge cutting (eg. Quincke) spinal needle with trocar was introduced at the L3 - L4 interspace.  CSF was obtained at 8 cm after 3 pass(es).  When CSF fluid was noted an opening pressure was not obtained.  Samples were collected and sent to the lab after proper labeling.     Intrathecal MTX was administered slowly over 5 min.    The spinal needle with trocar was removed with minimal bleeding noted upon removal. A sterile bandage was placed over the puncture site after holding pressure.    Findings  6 mL of clear spinal fluid was obtained.  The CSF was sent to lab for routine studies including cell count, protein, glucose.          Condition     The patient tolerated the procedure well and remains in the same condition as pre-procedure.    Complications and Recommendations     None; patient tolerated the procedure well.      Requesting Service: No service for patient encounter.    Time Requested: 1000  Time Completed: 1215  Comments: None    Resident(s) Performing Procedure: Celesta Gentile  Resident Year: PGY3    I was present and assisted for the entirety of the procedure. Roby Lofts, MD

## 2021-01-11 NOTE — Unmapped (Unsigned)
Peripheral stick done by Normajean Baxter, 23g L H, labs drawn and sent.

## 2021-01-11 NOTE — Unmapped (Unsigned)
Patient arrived to room 11 before having BMBX pump was disconnected. Infusion was completed. PICC line flushed with no issues and blood return.

## 2021-01-11 NOTE — Unmapped (Signed)
Please leave dressing in place and keep it dry for 24 hrs before removing. You can resume normal activities tomorrow, but take things easy today.     You may take tylenol as needed for discomfort at the area where the biopsy was taken.   After receiving Ativan, please do not drive today.     If you have questions between 8am to 5 pm Monday through Friday please call [phone] and speak to the operator.      For emergencies, evenings or weekends, please call [phone] and ask for oncology fellow on call.     Reasons to call emergency line may include:   Fever of 100.5 or greater   Nausea and/or vomiting not relieved with nausea medicine   Diarrhea or constipation   Severe pain not relieved with usual pain regimen       Lab on 01/11/2021   Component Date Value Ref Range Status    Blood Type 01/11/2021 O POS   Final    Select Screen 3 01/11/2021 NEG   Final    Patient has a history of red cell antibodies    WBC 01/11/2021 9.8  3.6 - 11.2 10*9/L Final    RBC 01/11/2021 4.55  4.26 - 5.60 10*12/L Final    HGB 01/11/2021 13.7  12.9 - 16.5 g/dL Final    HCT 29/56/2130 40.0  39.0 - 48.0 % Final    MCV 01/11/2021 88.0  77.6 - 95.7 fL Final    MCH 01/11/2021 30.1  25.9 - 32.4 pg Final    MCHC 01/11/2021 34.2  32.0 - 36.0 g/dL Final    RDW 86/57/8469 16.6 (A) 12.2 - 15.2 % Final    MPV 01/11/2021 8.4  6.8 - 10.7 fL Final    Platelet 01/11/2021 224  150 - 450 10*9/L Final    Neutrophils % 01/11/2021 60.8  % Final    Lymphocytes % 01/11/2021 22.8  % Final    Monocytes % 01/11/2021 9.0  % Final    Eosinophils % 01/11/2021 5.7  % Final    Basophils % 01/11/2021 1.7  % Final    Absolute Neutrophils 01/11/2021 6.0  1.8 - 7.8 10*9/L Final    Absolute Lymphocytes 01/11/2021 2.2  1.1 - 3.6 10*9/L Final    Absolute Monocytes 01/11/2021 0.9 (A) 0.3 - 0.8 10*9/L Final    Absolute Eosinophils 01/11/2021 0.6 (A) 0.0 - 0.5 10*9/L Final    Absolute Basophils 01/11/2021 0.2 (A) 0.0 - 0.1 10*9/L Final    Anisocytosis 01/11/2021 Slight (A) Not Present Final

## 2021-01-11 NOTE — Unmapped (Unsigned)
Patient arrived to room 11 for bone marrow biopsy. Patient consented for procedure by NP, all questions/concerns addressed. Procedure completed w/o complication, patient moved to room 9 for LP. Medicine procedure team was paged.

## 2021-01-12 DIAGNOSIS — C9101 Acute lymphoblastic leukemia, in remission: Principal | ICD-10-CM

## 2021-01-12 DIAGNOSIS — C9100 Acute lymphoblastic leukemia not having achieved remission: Principal | ICD-10-CM

## 2021-01-12 NOTE — Unmapped (Addendum)
I have contacted the patient to review/clarify the following:    ?? The new patient consult visit with Dr. Juliene Pina from the thoracic surgery team tentatively scheduled for 01/15/2021 is in need of being rescheduled as requested by the patient.  ?? Mr. Macaraeg would like to coordinate new patient consultation with Dr. Juliene Pina on either:  ?? -Friday, January 29, 2021 anytime before 1 PM if possible  ?? -Friday, February 05, 2021 at anytime    ?? In addition, Mr. Avakian most recently had repeat chest CT imaging completed last on 12/28/2020 and has inquired if repeat imaging is warranted.  ?? This concern is been routed to Dr. [name] for review.    ?? Follow-up with [name] and the Leukemia Care Team is tentatively set next for tomorrow, Wednesday, January 13, 2021 to review the most recent bone marrow biopsy and the plan of care.    Otherwise, the patient expressed verbal understanding and had no other questions or concerns at this time.  He is amenable to the plan outlined above and will contact clinic back sooner should there be any other issues.    No other actions taken and symptoms been shared with Leukemia Care Team.    ADDENDA: 01/13/2021  The following concerns have been addressed by Dr. [name] and reviewed with the patient and his wife while in the clinic setting.    ?? The thoracic surgery consult with Dr. Juliene Pina has been rescheduled to Friday, January 29, 2021.    ?? Dr. [name] has indicated that should Mr. Wailes not be experiencing any concerning symptoms pertaining to his most recent pulmonary infection come late October/early November, then it may be reasonable to defer evaluation by Dr. Juliene Pina.    ?? At the present time, Dr. [name] has identified a follow-up with her is not indicated unless things change.    ?? Mr. Porche remains set for evaluation by the pulmonary team on 01/25/2021 and if repeat chest imaging is warranted, that team will coordinate what is needed.    ?? Moreover, Mr. Cromie and his wife are both aware that should any concerning symptoms present themselves, they are to reach out to Dr. [name] ASAP.    Both Mr. & Mrs. Franchi expressed understanding and had no other questions or concerns.  In addition, [name] reviewed the plan of care during the clinic follow-up as well.    No other actions taken.

## 2021-01-13 ENCOUNTER — Ambulatory Visit: Admit: 2021-01-13 | Discharge: 2021-01-13 | Payer: MEDICAID | Attending: Dermatology | Primary: Dermatology

## 2021-01-13 ENCOUNTER — Ambulatory Visit: Admit: 2021-01-13 | Discharge: 2021-01-13 | Payer: MEDICAID | Attending: Internal Medicine | Primary: Internal Medicine

## 2021-01-13 ENCOUNTER — Other Ambulatory Visit: Admit: 2021-01-13 | Discharge: 2021-01-13 | Payer: MEDICAID

## 2021-01-13 DIAGNOSIS — C9101 Acute lymphoblastic leukemia, in remission: Principal | ICD-10-CM

## 2021-01-13 LAB — CBC W/ AUTO DIFF
BASOPHILS ABSOLUTE COUNT: 0.1 10*9/L (ref 0.0–0.1)
BASOPHILS RELATIVE PERCENT: 1.3 %
EOSINOPHILS ABSOLUTE COUNT: 0.3 10*9/L (ref 0.0–0.5)
EOSINOPHILS RELATIVE PERCENT: 3.7 %
HEMATOCRIT: 38.8 % — ABNORMAL LOW (ref 39.0–48.0)
HEMOGLOBIN: 13.4 g/dL (ref 12.9–16.5)
LYMPHOCYTES ABSOLUTE COUNT: 1.7 10*9/L (ref 1.1–3.6)
LYMPHOCYTES RELATIVE PERCENT: 20.7 %
MEAN CORPUSCULAR HEMOGLOBIN CONC: 34.4 g/dL (ref 32.0–36.0)
MEAN CORPUSCULAR HEMOGLOBIN: 30.2 pg (ref 25.9–32.4)
MEAN CORPUSCULAR VOLUME: 87.5 fL (ref 77.6–95.7)
MEAN PLATELET VOLUME: 8.2 fL (ref 6.8–10.7)
MONOCYTES ABSOLUTE COUNT: 0.5 10*9/L (ref 0.3–0.8)
MONOCYTES RELATIVE PERCENT: 5.9 %
NEUTROPHILS ABSOLUTE COUNT: 5.6 10*9/L (ref 1.8–7.8)
NEUTROPHILS RELATIVE PERCENT: 68.4 %
PLATELET COUNT: 232 10*9/L (ref 150–450)
RED BLOOD CELL COUNT: 4.43 10*12/L (ref 4.26–5.60)
RED CELL DISTRIBUTION WIDTH: 17 % — ABNORMAL HIGH (ref 12.2–15.2)
WBC ADJUSTED: 8.1 10*9/L (ref 3.6–11.2)

## 2021-01-13 LAB — COMPREHENSIVE METABOLIC PANEL
ALBUMIN: 3.7 g/dL (ref 3.4–5.0)
ALKALINE PHOSPHATASE: 85 U/L (ref 46–116)
ALT (SGPT): 21 U/L (ref 10–49)
ANION GAP: 6 mmol/L (ref 5–14)
AST (SGOT): 21 U/L (ref <=34)
BILIRUBIN TOTAL: 0.3 mg/dL (ref 0.3–1.2)
BLOOD UREA NITROGEN: 6 mg/dL — ABNORMAL LOW (ref 9–23)
BUN / CREAT RATIO: 3
CALCIUM: 9.3 mg/dL (ref 8.7–10.4)
CHLORIDE: 112 mmol/L — ABNORMAL HIGH (ref 98–107)
CO2: 24 mmol/L (ref 20.0–31.0)
CREATININE: 1.94 mg/dL — ABNORMAL HIGH
EGFR CKD-EPI (2021) MALE: 44 mL/min/{1.73_m2} — ABNORMAL LOW (ref >=60)
GLUCOSE RANDOM: 104 mg/dL (ref 70–179)
POTASSIUM: 4.3 mmol/L (ref 3.4–4.8)
PROTEIN TOTAL: 6.2 g/dL (ref 5.7–8.2)
SODIUM: 142 mmol/L (ref 135–145)

## 2021-01-13 LAB — MAGNESIUM: MAGNESIUM: 1.7 mg/dL (ref 1.6–2.6)

## 2021-01-13 LAB — URIC ACID: URIC ACID: 7.6 mg/dL

## 2021-01-13 LAB — LACTATE DEHYDROGENASE: LACTATE DEHYDROGENASE: 184 U/L (ref 120–246)

## 2021-01-13 LAB — PHOSPHORUS: PHOSPHORUS: 4.4 mg/dL (ref 2.4–5.1)

## 2021-01-13 NOTE — Unmapped (Signed)
Dermatology Note     Assessment and Plan:      Adam Keith was seen today for rash.    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  -DIHS/DRESS can encompass a spectrum of findings including morbilliform rash, fever, lymphadenopathy, and end-organ damage. There are no specific diagnostic tests to confirm a diagnosis of DRESS/DIHS; it is a diagnosis of exclusion.   - Patient was on a prednisone taper for known DRESS from hospitalization 11/2020 through mid-September, 2022. Has been off prednisone for nearly 5-month. Most recent dose with good effect, prednisone 20mg .   - Most recent labs notable for: slightly elevated absolute eos above normal 10/10, Rising serum Cr 1.6 --> 1.98 10/3.  Dx  - Scaly patch on palm KOH negative  - Obtain lab work CMP, CBC today to evaluate Cr and absolute eos  Tx  - If absolute eos and/or Cr rising, will consider this as exacerbation of DRESS and initiate treatment with systemic oral steroids  - In the event that we restart prednisone, will discuss dosing and run this by oncology for approval first prednisone starting with 20 mg prednisone  - Pt defers topical medications       The patient was advised to call for an appointment should any new, changing ,or symptomatic lesion develop.     RTC: Return in about 4 weeks (around 02/10/2021). or sooner as needed   ___________________________________________________________________    Referring Provider: Jasmine December Fitzgeral*     Chief Complaint     Chief Complaint   Patient presents with    Rash     All over rash x 3 weeks.  Itchy.  Requesting Prednisone.     HPI     Adam Keith. Adam Keith is a pleasant 40 y.o. male, with history of DRESS syndrome 2/2 cephalosporin (ceftalorline),  B-cell ALL ( pH+), who presents for diffuse, generalized pruritus. He was recently noted to have exacerbation in DRESS 11/2020 due to cephalosporin exposure versus missed prednisone doses while critically ill 11/2020. He was restarted on several weeks of prednisone taper that completed mid-September. At that time, he reports the severe itching returned. Though he was prescribed prednisone around this time to bridge until evaluation, he never obtained this due to pharmacy location, medication availability, etc. Currently not on oral steroids. He does not use topical medications as he feels these make him feel suffocated.     Patient denies any new rash at this time. Reports itching and when he scratches, it hurts because his skin feels irritated/raw. The only thing that helps this itching is prednisone 20. He has not gotten relief from anything less than 20 and feels that more than 20 doesn't provide him with any additional benefit.     Labs being evaluated frequently. CBC diff (8/18-->10/10): normal absolute eos --> slightly elevated absolute eos above normal . CMP (8/18 --> 10/3): Serum Cr 1.6 --> 1.98    Has known ALL for which he is following with Oncology ([name]). Most recently obtained intrathecal chemo with methotrexate. He is going for labs and oncology evaluation later today.      The patient denies any other new or changing lesions or areas of concern.     Pertinent Past Medical History     No history of skin cancer    Problem List    None        Family History:  Negative for melanoma.    Past Medical History, Family History, Social History , Med List, Allergies, Problem List reviewed in the rooming section of Epic     ROS: Other than symptoms mentioned in the HPI, The patient's review of systems is negative. Specifically, the patient has not experienced any recent fever, chills, weight change, headache, cough, wheezing, chest pain, joint pain, muscle aches, vomiting, diarrhea, abdominal pain, vision change, red eyes, sores, blisters, mouth ulcers, frequent infections, poor balance, poor sleep, bleeding, bruising or signs of depression.    Physical Examination     General: Well-appearing male, anxious appearing and shivering  Neuro: Alert and oriented, answers questions appropriately  Focal Skin Exam: Per patient request, Focal examination of face, trunk, upper extremitities, hands, palms, feet, soles  Skin examination was notable for the following:  - Diffuse xerosis  - Well-circumscribed large coin-shaped patch with circumferential desquamation of left palm  - No new erythema, lesions, or rash of any kind on exam    All areas not commented on are within normal limits or unremarkable.

## 2021-01-13 NOTE — Unmapped (Addendum)
Your counts look great, as do your bone marrow and lumbar puncture.     You are finished with blinatumomab. You will stay on single agent ponatinib as maintenance therapy. Continue taking 30 mg for now. I will be in touch later this week with your BCR-ABL results and if negative, we will decrease the posatinib dose.    Follow up in 1 month with Dr. Senaida Ores, who will take over management of your leukemia.    Results for orders placed or performed in visit on 01/13/21   Comprehensive Metabolic Panel   Result Value Ref Range    Sodium 142 135 - 145 mmol/L    Potassium 4.3 3.4 - 4.8 mmol/L    Chloride 112 (H) 98 - 107 mmol/L    CO2 24.0 20.0 - 31.0 mmol/L    Anion Gap 6 5 - 14 mmol/L    BUN 6 (L) 9 - 23 mg/dL    Creatinine 7.25 (H) 0.60 - 1.10 mg/dL    BUN/Creatinine Ratio 3     eGFR CKD-EPI (2021) Male 44 (L) >=60 mL/min/1.23m2    Glucose 104 70 - 179 mg/dL    Calcium 9.3 8.7 - 36.6 mg/dL    Albumin 3.7 3.4 - 5.0 g/dL    Total Protein 6.2 5.7 - 8.2 g/dL    Total Bilirubin 0.3 0.3 - 1.2 mg/dL    AST 21 <=44 U/L    ALT 21 10 - 49 U/L    Alkaline Phosphatase 85 46 - 116 U/L   Uric acid   Result Value Ref Range    Uric Acid 7.6 3.7 - 9.2 mg/dL   Magnesium Level   Result Value Ref Range    Magnesium 1.7 1.6 - 2.6 mg/dL   Lactate dehydrogenase   Result Value Ref Range    LDH 184 120 - 246 U/L   Phosphorus Level   Result Value Ref Range    Phosphorus 4.4 2.4 - 5.1 mg/dL   CBC w/ Differential   Result Value Ref Range    WBC 8.1 3.6 - 11.2 10*9/L    RBC 4.43 4.26 - 5.60 10*12/L    HGB 13.4 12.9 - 16.5 g/dL    HCT 03.4 (L) 74.2 - 48.0 %    MCV 87.5 77.6 - 95.7 fL    MCH 30.2 25.9 - 32.4 pg    MCHC 34.4 32.0 - 36.0 g/dL    RDW 59.5 (H) 63.8 - 15.2 %    MPV 8.2 6.8 - 10.7 fL    Platelet 232 150 - 450 10*9/L    Neutrophils % 68.4 %    Lymphocytes % 20.7 %    Monocytes % 5.9 %    Eosinophils % 3.7 %    Basophils % 1.3 %    Absolute Neutrophils 5.6 1.8 - 7.8 10*9/L    Absolute Lymphocytes 1.7 1.1 - 3.6 10*9/L    Absolute Monocytes 0.5 0.3 - 0.8 10*9/L    Absolute Eosinophils 0.3 0.0 - 0.5 10*9/L    Absolute Basophils 0.1 0.0 - 0.1 10*9/L    Anisocytosis Slight (A) Not Present

## 2021-01-13 NOTE — Unmapped (Signed)
[facility]    Patient Name: Adam Keith  Patient Age: 40 y.o.  Encounter Date: 01/13/2021    Primary Care Provider:  [name], MD    Referring Physician:  Dierdre Harness, MD  [address]  [place],  [place] [address]    Reason for visit:  Ph+ B-ALL follow up to consider timing of therapy change    Assessment:  A 40 y.o. 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 The course was complicated by septic shock, candida krusei fungemia, MRSA bacteremia with septic emboli c/b acute renal failure and respiratory distress requiring dialysis and intubation.  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  He subsequently had difficulty tolerating single agent dasatinib (as a bridge to planned ponatinib-blinatumomab--had fluid retention and pleural effusion).      He is s/p cycle 4 of ponatinib-blinatumomab, initiated for treatment of MRD in the setting of poor tolerance of standard therapy. His counts have recovered nicely and he is in fllow MRD negative remission.  Assuming he is also in molecular remission, we will proceed with single agent ponatinib as maintenance therapy. Would favor single agent given immunosuppression on blinatumomab in the context of his history of recurrent PNA, succession of life-threatening infections and well controlled leukemia. I clarified with him that he understands the rationale for IVIg therapy, but is adamant that he does not want to resume this line of treatment for his hypogammaglobulinemia and infection history.    Plan and Recommendations:  Ph+ B-ALL: CSF with rare blasts 12/6, now in CR (low level BCR-ABL) and no blasts in CSF  - Completed total of 4 cycles blinatumomab-ponatinib for eradication of MRD after initial GRAAPH-2005 + dasatinib.  - Transition to ponatinib monotherapy maintenance  ?? Reduce dose of ponatinib from 30 mg to 15 mg if BCR-ABL comes back negative (change rx at next refill)  ?? Continue aspirin while on ponatinib  - Will follow up on BCR-ABL results    DRESS: followed by Dr. 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 Now s/p dialysis  - stable Cr appears to have developed CKD due to incompletely healed acute injury.  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   - Dr. [name] and Dr. Maple Mirza following patient.  He will see thoracic surgery as well given recurrent pain in area of effusion.  **Hx Candidemia during induction  -isavuconazole   - followed by Dr. [name]  **Hx Covid-19   - symptoms now resolved    Symptom management: bowel regimen as listed in medication list, analgesics as listed in medication list or symptoms reviewed and due to acceptable control, no changes made  **Peripheral neuropathy, sensory and motor  - Grade 1-2 monitor  **Chronic Back Pain, complicated by hospital stay and procedures  - Followed by Dr. Genice Rouge, Palliative Care  **Depression/anxiety: met with [name], [facility]  - nortriptyline  - PRN clonazepam  - has not been using PRN hydroxyzine    Venous access: I recommend the following change for venous access : PICC line removal  Supportive Care Recommendations:  We recommend based on the patient???s underlying diagnosis and treatment history the following supportive care:      1. Antimicrobial prophylaxis:    - Viral - valacyclovir 500mg  daily  - Bacterial - levofloxacin 500 mg if ANC <0.5  - Fungal - on treatment isavuconazole  - PJP - Bactrim DS BID Sat/Sun  -Evusheld 04/21/2020-recommend another dose to achieve FDA recommendation update.    2. Blood product support:  I recommend the following intervals for laboratory monitoring to determine transfusion needs and monitor for hematologic effects of therapy and underlying disease: no routine monitoring outside of clinic follow-up    Leukoreduced blood products are required.  Irradiated blood products are preferred, but in case of urgent transfusion needs non-irradiated blood products may be used:     -  RBC transfusion threshold: transfuse 2 units for Hgb < 8 g/dL.  -  Platelet transfusion threshold: transfuse 1 unit of platelets for platelet count < 10, or for bleeding or need for invasive procedure.    3. Hematopoietic growth factor support: none      Care coordination: The next [facility] Follow up requested:  - bone marrow biopsy s/p cycle  - RTC after bmbx      These services include the following elements of medical decision making:  addressing a hematologic cancer that poses a threat to life and/or bone marrow function  interpretation of diagnostic test reports or ordering of diagnostic tests and independent interpretation of diagnostic tests  High risk of morbidity due to drug therapy requiring monitoring for toxicity or decisions regarding goals and continuation of antineoplastic therapy      ______________________________________________________________________    Documentation assistance was provided by Graceann Congress, Scribe, on 01/13/2021 at 4:39 PM for Troy Sine. [name], MD    Documentation assistance provided by Medical Scribe, Graceann Congress, who was present during the entirety of the visit. I reviewed the note below and validated all of the information provided to ensure accuracy and completeness.       Troy Sine [name], MD  Leukemia Program  Division of Hematology/Oncology  [facility]    Nurse Navigator (non-clinical trial patients): Colletta Maryland, RN        Tel. [phone]       Fax. [phone]  Toll-free appointments: [phone]  Scheduling assistance: [phone]  After hours/weekends: [phone] (ask for adult hematology/oncology on-call)          History of Present Illness:  We had the pleasure of seeing Adam Keith in the Leukemia Clinic at the [facility] on 01/13/2021.  He is a 40 y.o. male with Ph+ B-ALL.          His oncologic history is as follows:      Oncology History Overview Note   Referring/Local Oncologist: None    Diagnosis:Ph+ ALL    Genetics:    Karyotype/FISH:Abnormal Karyotype: 46,XY,t(9;22)(q34;q11.2)[1]/45,XY,der(7;9)(q10;q10)t(9;22)(q34;q11.2),der(22)t(9;22)[11]/46,sdl,+der(22)t(9;22)[5]/46,XY[3]     Abnormal FISH: A BCR/ABL1 interphase FISH assay shows an abnormal signal pattern in 97% of the 100 cells scored. 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  The findings support a diagnosis of leukemia and have implications for targeted therapy and for monitoring residual disease.      Molecular Genetics:  BCR-ABL1 p210 transcripts were detected at a level of 46.479 IS% ratio in bone marrow.  BCR-ABL1 p190 transcripts were  detected at a level of 4 in 100,000 cells in bone marrow.    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)     01/24/2020 - 04/02/2020 Chemotherapy    IP/OP LEUKEMIA GRAAPH-2005 + RITUXIMAB < 60 YO  rituximab hypercvad (odd and even course)     02/24/2020 Remission    CR with PCR MRD+; marrow showing 70% blasts with <1% blasts, negative flow MRD, BCR-ABL p210 PCR 0.197%; CNS negative for blasts     03/09/2020 Progression    CSF - rare blast ID'ed - IT chemo given    03/13/20 - IT chemo, CSF negative 04/16/2020 Biopsy    BM Bx with 40-50% cellularity, <1% blasts, MRD flow cytometry negative, BCR-ABL p210 transcripts 0.036%     05/28/2020 - 05/28/2020 Chemotherapy    OP AML - CNS THERAPY (INTRATHECAL CYTARABINE, INTRATHECAL METHOTREXATE, OR INTRATHECAL TRIPLE)  Select one of the following: cytarabine IT 100 mg with hydrocortisone 50 mg, cytarabine IT 40 mg with methotrexate 15 mg with hydrocortisone 50 mg, OR methotrexate IT 12 mg with hydrocortisone 50 mg     06/19/2020 -  Chemotherapy    IP/OP LEUKEMIA BLINATUMOMAB 7-DAY INFUSION (MINIMAL RESIDUAL DISEASE; WT >= 22 KG) (HOME INFUSION)      Cycles 1*-4: Blinatumomab 28 mcg/day Days 1-28 of 6-week cycle.  *Given in the inpatient setting on Days 1-3 on Cycle 1 and Days 1-2 on Cycle 2, while other treatment days are given in the outpatient setting.    Cycle 1 MRD Dosing     07/18/2020 Adverse Reaction    Hospitalization: fevers. Pneumonia     07/23/2020 Biopsy    Diagnosis  Bone marrow, right iliac, aspiration and biopsy  -   Normocellular bone marrow (50%) with trilineage hematopoiesis and 1% blasts by manual aspirate differential  -   Flow cytometry MRD analysis reveals no definitive immunophenotypic evidence of residual B lymphoblastic leukemia   - BCR-ABL p210 0.006%         08/10/2020 -  Chemotherapy    Cycle 2 blinatumomab-ponatinib  Ponatinib 30mg     1 IT per cycle     09/21/2020 Adverse Reaction    Covid-19, symptomatic but not requiring hospitalization.     10/06/2020 -  Chemotherapy    Cycle 3 blinatumomab-ponatinib  Ponatinib 30mg        11/01/2020 Adverse Reaction    Hospitalization: MRSA Pneumonia requiring intubation    Blinatumomab stopped.  Ponatinib held until he stabilized.       12/14/2020 -  Chemotherapy    Cycle 4 blinatumomab 28 mcg/day days 1-28, ponatinib 30 mg per day IT triple therapy day 29     ALL (acute lymphoblastic leukemia) (CMS-HCC)   06/11/2020 -  Chemotherapy    IP/OP LEUKEMIA BLINATUMOMAB 7-DAY INFUSION (MINIMAL RESIDUAL DISEASE; WT >= 22 KG) (HOME INFUSION)  Cycles 1*-4: Blinatumomab 28 mcg/day Days 1-28 of 6-week cycle.  *Given in the inpatient setting on Days 1-3 on Cycle 1 and Days 1-2 on Cycle 2, while other treatment days are given in the outpatient setting.     09/21/2020 Initial Diagnosis    ALL (acute lymphoblastic leukemia) (CMS-HCC)         Interim History:  Since last seen here, doing well. Unfortunately had to cancel planned vacation due to financial  constraints.  Denies any side effects. Occasional dyspnea and CP with activity due to deconditioning. Otherwise no swelling or abdominal pain. He inquired whether he can come off any of his medications at this time.    Past Medical, Surgical and Family History were reviewed and pertinent updates were made in the Electronic Medical Record      ECOG Performance Status: 1    Medications:    Current Outpatient Medications   Medication Sig Dispense Refill   ??? acetaminophen (TYLENOL) 325 MG tablet Take 650 mg by mouth every six (6) hours as needed for pain.      ??? amLODIPine (NORVASC) 10 MG tablet Take 1 tablet (10 mg total) by mouth daily. 30 tablet 0   ??? aspirin 81 MG chewable tablet Chew 1 tablet (81 mg total) daily. 36 tablet 11   ??? carvediloL (COREG) 12.5 MG tablet TAKE 1 TABLET BY MOUTH TWO TIMES A DAY. 180 tablet 1   ??? clonazePAM (KLONOPIN) 0.5 MG tablet Take 1 tablet (0.5 mg total) by mouth two (2) times a day as needed for anxiety or sleep. 60 tablet 1   ??? gabapentin (NEURONTIN) 300 MG capsule Take 1 capsule (300 mg total) by mouth Three (3) times a day. 270 capsule 6   ??? HYDROmorphone (DILAUDID) 2 MG tablet Take 1 tablet (2 mg total) by mouth every six (6) hours as needed for pain,moderate (4-6). 30 tablet 0   ??? nortriptyline (PAMELOR) 50 MG capsule Take 2 capsules (100 mg total) by mouth nightly. 60 capsule 11   ??? OXYCONTIN 15 mg 12 hr crush resistant ER/CR tablet Take 1 tablet (15 mg total) by mouth Two (2) times a day. 56 tablet 0   ??? PONATinib (ICLUSIG) 30 mg tablet Take 1 tablet (30 mg total) by mouth daily. Swallow tablets whole. Do not crush, break, cut or chew tablets. 30 tablet 5   ??? sulfamethoxazole-trimethoprim (BACTRIM DS) 800-160 mg per tablet Take 1 tablet (160 mg of trimethoprim total) by mouth 2 times a day on Saturday, Sunday. For prophylaxis while on chemo. 48 tablet 3   ??? valACYclovir (VALTREX) 500 MG tablet Take 1 tablet (500 mg total) by mouth daily. 90 tablet 11     No current facility-administered medications for this visit.       Vital Signs:  Vitals:    01/13/21 1542   BP: 142/95   Pulse: 94   Resp: 18   Temp: 36.7 ??C (98.1 ??F)   SpO2: 100%         Relevant Physical Exam Findings:  General: Resting in no apparent distress, accompanied by spouse  HEENT:  Clear sclera, conjunctiva, mask in place  LYMPH:  no palpable cervical, supraclavicular, axillary, or inguinal nodes  CARDAC: RRR, no R,M,Gs, no pitting peripheral edema  RESP: nonlabored, scattered wheeze, otherwise CTA  GI: Soft, nontender, active bowel sounds, no hepatic or splenomegaly  NEURO: alert, and oriented x4, steady gait, no focal deficits  PSYCH: anxious affect  DERM: no visible rashes, lesions  LINE: PICC      Relevant Laboratory, radiology and pathology results:  I personally viewed the most recent internal records, laboratory results, hematopathology report and molecular pathology reports  and discussed the available results with the patient and family  A summary of results follows:  Lab on 01/13/2021   Component Date Value Ref Range Status   ??? Sodium 01/13/2021 142  135 - 145 mmol/L Final   ??? Potassium 01/13/2021 4.3  3.4 - 4.8  mmol/L Final   ??? Chloride 01/13/2021 112 (A) 98 - 107 mmol/L Final   ??? CO2 01/13/2021 24.0  20.0 - 31.0 mmol/L Final   ??? Anion Gap 01/13/2021 6  5 - 14 mmol/L Final   ??? BUN 01/13/2021 6 (A) 9 - 23 mg/dL Final   ??? Creatinine 01/13/2021 1.94 (A) 0.60 - 1.10 mg/dL Final   ??? BUN/Creatinine Ratio 01/13/2021 3   Final   ??? eGFR CKD-EPI (2021) Male 01/13/2021 44 (A) >=60 mL/min/1.75m2 Final eGFR calculated with CKD-EPI 2021 equation in accordance with [employer] and [employer] of Nephrology Task Force recommendations.   ??? Glucose 01/13/2021 104  70 - 179 mg/dL Final   ??? Calcium 09/81/1914 9.3  8.7 - 10.4 mg/dL Final   ??? Albumin 78/29/5621 3.7  3.4 - 5.0 g/dL Final   ??? Total Protein 01/13/2021 6.2  5.7 - 8.2 g/dL Final   ??? Total Bilirubin 01/13/2021 0.3  0.3 - 1.2 mg/dL Final   ??? AST 30/86/5784 21  <=34 U/L Final   ??? ALT 01/13/2021 21  10 - 49 U/L Final   ??? Alkaline Phosphatase 01/13/2021 85  46 - 116 U/L Final   ??? Uric Acid 01/13/2021 7.6  3.7 - 9.2 mg/dL Final   ??? Magnesium 69/62/9528 1.7  1.6 - 2.6 mg/dL Final   ??? LDH 01/13/2021 184  120 - 246 U/L Final   ??? Phosphorus 01/13/2021 4.4  2.4 - 5.1 mg/dL Final   ??? WBC 41/32/4401 8.1  3.6 - 11.2 10*9/L Final   ??? RBC 01/13/2021 4.43  4.26 - 5.60 10*12/L Final   ??? HGB 01/13/2021 13.4  12.9 - 16.5 g/dL Final   ??? HCT 02/72/5366 38.8 (A) 39.0 - 48.0 % Final   ??? MCV 01/13/2021 87.5  77.6 - 95.7 fL Final   ??? MCH 01/13/2021 30.2  25.9 - 32.4 pg Final   ??? MCHC 01/13/2021 34.4  32.0 - 36.0 g/dL Final   ??? RDW 44/06/4740 17.0 (A) 12.2 - 15.2 % Final   ??? MPV 01/13/2021 8.2  6.8 - 10.7 fL Final   ??? Platelet 01/13/2021 232  150 - 450 10*9/L Final   ??? Neutrophils % 01/13/2021 68.4  % Final   ??? Lymphocytes % 01/13/2021 20.7  % Final   ??? Monocytes % 01/13/2021 5.9  % Final   ??? Eosinophils % 01/13/2021 3.7  % Final   ??? Basophils % 01/13/2021 1.3  % Final   ??? Absolute Neutrophils 01/13/2021 5.6  1.8 - 7.8 10*9/L Final   ??? Absolute Lymphocytes 01/13/2021 1.7  1.1 - 3.6 10*9/L Final   ??? Absolute Monocytes 01/13/2021 0.5  0.3 - 0.8 10*9/L Final   ??? Absolute Eosinophils 01/13/2021 0.3  0.0 - 0.5 10*9/L Final   ??? Absolute Basophils 01/13/2021 0.1  0.0 - 0.1 10*9/L Final   ??? Anisocytosis 01/13/2021 Slight (A) Not Present Final   Hospital Outpatient Visit on 01/11/2021   Component Date Value Ref Range Status   ??? Protein, CSF 01/11/2021 43  19 - 57 mg/dL Final ??? Glucose, CSF 01/11/2021 55  48 - 79 mg/dL Final   ??? Tube # CSF 01/11/2021 Tube 1   Final   ??? Color, CSF 01/11/2021 Colorless   Final   ??? Appearance, CSF 01/11/2021 Clear   Final   ??? Nucleated Cells, CSF 01/11/2021 20 (A) 0 - 5 ul Final   ??? RBC, CSF 01/11/2021 108 (A) 0 ul Final   ??? #Cells counted for Diff  01/11/2021 100   Final   ??? Lymphs %, CSF 01/11/2021 88.0  % Final   ??? Mono/Macrophage %, CSF 01/11/2021 12.0  % Final   ??? Diagnosis 01/11/2021    Final                    Value:This result contains rich text formatting which cannot be displayed here.   ??? Clinical History 01/11/2021    Final                    Value:This result contains rich text formatting which cannot be displayed here.   ??? Gross Description 01/11/2021    Final                    Value:This result contains rich text formatting which cannot be displayed here.   ??? Microscopic Description 01/11/2021    Final                    Value:This result contains rich text formatting which cannot be displayed here.   ??? Disclaimer 01/11/2021    Final                    Value:This result contains rich text formatting which cannot be displayed here.   ??? Flow Cytometry Summary 01/11/2021    Final                    Value:Flow Cytometric Immunophenotyping Results:    CSF fluid  Viability:   QNS  Total of Markers Charged 8  Markers-1 Charged  7    Gated Population:  80%    Description of Gated Cells (% of Gated)     B-Cell Markers:    CD19   <1%  CD20   3%  Kappa   <1%  Lambda   <1%    T-Cell Markers:    CD5   99%    Miscellaneous:    CD10    <1%  CD38   11%  CD45    100%    Flow Cytometry Interpretation:  Flow cytometric analysis of the CSF fluid reveals 80% of cells within the lymphocyte region.    Cells within the lymphocyte region are composed of 99%, CD5-positive, CD19-negative,  presumed T-cells and less than 1% B-cells.  A CD19/CD10 positive population is not identified.    This test, utilizing analyte-specific reagents (ASR) was developed and its performance characteristics determined by the [facility].  It has not been cleared or approved by the U.S. Food and Drug Administration (FDA).  The FDA has determined that such clearance or approval is not necessary.                            This test is used for clinical purposes.  It should not be regarded as investigational or for research.      Hospital Outpatient Visit on 01/11/2021   Component Date Value Ref Range Status   ??? Diagnosis 01/11/2021    Final                    Value:This result contains rich text formatting which cannot be displayed here.   ??? Diagnosis Comment 01/11/2021    Final                    Value:This result contains rich text  formatting which cannot be displayed here.   ??? Clinical History 01/11/2021    Final                    Value:This result contains rich text formatting which cannot be displayed here.   ??? Gross Description 01/11/2021    Final                    Value:This result contains rich text formatting which cannot be displayed here.   ??? Microscopic Description 01/11/2021    Final                    Value:This result contains rich text formatting which cannot be displayed here.   ??? Disclaimer 01/11/2021    Final                    Value:This result contains rich text formatting which cannot be displayed here.   ??? Cytogenetics Test, Other 01/11/2021 Collected   Final   ??? Collection 01/11/2021 Collected   Final    Specimen received in laboratory. Pathology report will be generated upon completion of testing   ??? MRD Value, Bone Marrow 01/11/2021 <0.01  % (Mononuclear Cells) Final   ??? MRD Interpretation, Bone Marrow 01/11/2021    Final    There is no definitive immunophenotypic evidence of residual B lymphoblastic leukemia/lymphoma by flow cytometry.     CD19+ B-cells comprise <0.10% of sample cellularity.     ??? Comments, MRD Bone Marrow 01/11/2021    Final    The following antibodies were tested:      CD3, CD9, CD10, CD13 & CD33, CD 19, CD20, CD34, CD38, CD45, CD58, CD71.    Assay limit of detection is 0.01%    This test, utilizing analyte-specific reagents (ASR), was developed and its performance characteristics determined by the [facility].  It has not been cleared or approved by the U.S. Food and Drug Administration (FDA).  The FDA has determined that such clearance or approval is not necessary.  The test is used for clinical purposes.  It should not be regarded as investigational or for research.   The flow cytometry lab is CLIA certified and CAP accredited to perform high complexity testing and is approved by the [facility] to perform B-ALL MRD testing.     ??? Collection 01/11/2021 Collected   Final   Lab on 01/11/2021   Component Date Value Ref Range Status   ??? Blood Type 01/11/2021 O POS   Final   ??? Select Screen 3 01/11/2021 NEG   Final    Patient has a history of red cell antibodies   ??? WBC 01/11/2021 9.8  3.6 - 11.2 10*9/L Final   ??? RBC 01/11/2021 4.55  4.26 - 5.60 10*12/L Final   ??? HGB 01/11/2021 13.7  12.9 - 16.5 g/dL Final   ??? HCT 29/56/2130 40.0  39.0 - 48.0 % Final   ??? MCV 01/11/2021 88.0  77.6 - 95.7 fL Final   ??? MCH 01/11/2021 30.1  25.9 - 32.4 pg Final   ??? MCHC 01/11/2021 34.2  32.0 - 36.0 g/dL Final   ??? RDW 86/57/8469 16.6 (A) 12.2 - 15.2 % Final   ??? MPV 01/11/2021 8.4  6.8 - 10.7 fL Final   ??? Platelet 01/11/2021 224  150 - 450 10*9/L Final   ??? Neutrophils % 01/11/2021 60.8  % Final   ??? Lymphocytes % 01/11/2021 22.8  %  Final   ??? Monocytes % 01/11/2021 9.0  % Final   ??? Eosinophils % 01/11/2021 5.7  % Final   ??? Basophils % 01/11/2021 1.7  % Final   ??? Absolute Neutrophils 01/11/2021 6.0  1.8 - 7.8 10*9/L Final   ??? Absolute Lymphocytes 01/11/2021 2.2  1.1 - 3.6 10*9/L Final   ??? Absolute Monocytes 01/11/2021 0.9 (A) 0.3 - 0.8 10*9/L Final   ??? Absolute Eosinophils 01/11/2021 0.6 (A) 0.0 - 0.5 10*9/L Final   ??? Absolute Basophils 01/11/2021 0.2 (A) 0.0 - 0.1 10*9/L Final   ??? Anisocytosis 01/11/2021 Slight (A) Not Present Final     Chest X-ray PA and lateral ??Increased interstitial edema versus atypical infection/inflammation.  ??  Small right pleural effusion.    Diagnosis   Date Value Ref Range Status   01/11/2021   Final    A:  Cerebrospinal fluid, cytospin review and flow cytometry   -  No blasts identified by morphology or flow cytometric analysis    This electronic signature is attestation that the pathologist personally reviewed the submitted material(s) and the final diagnosis reflects that evaluation.       Diagnosis   Date Value Ref Range Status   01/11/2021   Final    A:  Cerebrospinal fluid, cytospin review and flow cytometry   -  No blasts identified by morphology or flow cytometric analysis    This electronic signature is attestation that the pathologist personally reviewed the submitted material(s) and the final diagnosis reflects that evaluation.     01/11/2021   Final    Bone marrow, left iliac, aspiration and biopsy  -  Normocellular bone marrow (30% overall) with trilineage hematopoiesis and less than 1% blasts by manual aspirate differential  -   Flow cytometry MRD analysis reveals no definitive immunophenotypic evidence of residual B lymphoblastic leukemia (see Comment)     -  See linked reports for associated Ancillary Studies.      This electronic signature is attestation that the pathologist personally reviewed the submitted material(s) and the final diagnosis reflects that evaluation.     09/18/2020   Final    A:  Cerebrospinal fluid, cytospin review    -  Bloody specimen with no blasts identified    This electronic signature is attestation that the pathologist personally reviewed the submitted material(s) and the final diagnosis reflects that evaluation.

## 2021-01-13 NOTE — Unmapped (Addendum)
[facility] releases most results to you as soon as they are available. Therefore, you may see some results before we do. Please give [place] 3 business days to review the tests and contact you by phone or through MyChart. If you are concerned that some results may be upsetting or confusing, you may wish to wait until we contact you before looking at the report in MyChart. If you have an urgent question, you can send [place] a message or call our clinic. Otherwise, we prefer that you wait 3 business days for [place] to contact you.    [facility]     Today your resident physician was: Dr. Alan Ripper Korion Cuevas    At today's visit, we reviewed the following:     Plan Summary:    Itching: We will see what your labs show before we consider starting steroids back and we will talk to oncology about this plan.       Thank you for choosing [facility].  If you have any other questions or concerns, please contact us using [facility] or call [phone].      If you would like to get into contact with your dermatologist, please call our office at [phone] or, for non-urgent questions, send a MyChart message to the attending physician for the visit. Any message sent to the visit's attending physician on MyChart will be routed to the resident physician who participated in your care. Please allow 3 usiness days for response via MyChart.

## 2021-01-14 NOTE — Unmapped (Signed)
1713 PICC line removed intact, catheter length 43 cm. Dressing applied after hemostasis. Reviewed to not lift anything over 5 lbs, pull self up with arm watch for oozing and apply pressure for 15 minutes over site. If continues to ooze to apply pressure again for 15 minutes, if arm swells or continues to bleed go to ED. Watch for signs of infection, fever, chills redness or pus like drainage. Remove dressing in 24 hours and may shower, no tub bathes, swimming or jacuzzi patches for 5 days.

## 2021-01-14 NOTE — Unmapped (Unsigned)
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     Assessment/Plan:   #Pain: Increased following his recent hospitalization, with a higher daily PRN dilaudid requirement, but is now returning to baseline. Patient has 8 dilaudid pills left.  - Continue oxycontin 15mg  PO BID       - Refilled today, can fill 12/10/20  - Continue dilaudid 2mg  PO q6hr PRN        - Refilled today, can fill today  - Continue gabapentin 300mg  PO TID    #Opioid-Induced Constipation: Patient denies.   - Continue senna qnight    #Fatigue: Energy level is improving overall, and has been very good for the last few days.  - Continue to follow    #Anxiety: Mood overall has been good the last few days. Followed by [name] NP.  - Continue nortriptyline, PRN klonipin    #Advanced Care Planning: Did not address this visit, but patient is looking forward to completing his treatment, despite the setback of his hospitalization.  - Continue to follow    #Controlled substances risk management:  ??? Patient does not have a signed pain medication agreement with our team.  ??? [place] database was reviewed today and it was appropriate.  ??? Urine drug screen was not performed at this visit. Findings: not applicable.  ??? Patient has received information about safe storage and administration of medications.  ??? Patient has not received a prescription for narcan; is not applicable.     F/u: 1 month    ----------------------------------------  Referring Provider: Dr. Berline Lopes  Oncology Team: Malignant hematology team  PCP: [name], MD    HPI: 40 year old man with a diagnosis of B-ALL, diagnosed in October 2021.  Recently had an 8-day hospitalization, discharged on 3/26.    Patient was admitted to [facility] 11/17/20-11/22/20 for MRSA PNA/bacteremia, c/b sepsis requiring intubation. Following discharge, he had increased pain requiring additional dilaudid doses, as well as increased fatigue and anxiety. He still has some pain with cough, but his symptoms have improved over the last few days, and he has been able to spend more time in his wood shop (though he is still limited by his PICC line). While he recovers, his wife has been helping with chores and errands, and he is generally trying to avoid crowded spaces until he feels better.    Due to his hospitalization, his cancer infusions were put on hold, but he is hoping to finish these by the end of the year.    Symptom Review:  General: Feeling better these past few days  Pain: Improving back to baseline  Fatigue: Improving  Mobility: Improving  Sleep: Stable  Appetite: Stable  Nausea: Denies  Bowel function: Stable  Mood: Improving    Palliative Performance Scale: 70% - Ambulation: Reduced / unable to do normal work, some evidence of disease / Self-Care: Full / Intake: Normal or reduced / Level of Conscious: Full    Coping/Support Issues: Some difficulty coping with his current illness.  However, well supported by his wife.    Goals of Care: Focused on treatment and cancer directed therapy.    Social History:   Name of primary support: Wife Adam Keith  Occupation: Was in the [employer]	[place]  Hobbies: Training and development officer  Current residence / distance from [employer]: [facility]    Advance Care Planning: Did not discuss this visit.    Objective  Allergies:   Allergies   Allergen Reactions   ??? Bupropion Hcl Other (See Comments)     Per patient out of touch with reality, suicidal, homicidal   ??? Cefepime Rash     DRESS   ??? Ceftaroline Fosamil Rash and Other (See Comments)     Rash X 2 02/2020, suspected DRESS 06/2020   ??? 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 Onion Anaphylaxis   ??? Vancomycin Analogues      Hearing loss with Lasix  Other reaction(s): Other (See Comments)  Hearing loss  lasix   ??? Bismuth Subsalicylate Nausea And Vomiting   ??? Furosemide      With Vancomycin caused hearing loss  Other reaction(s): Other (See Comments)  With Vancomycin caused hearing loss       Family History:  Cancer-related family history is negative for Melanoma.  He indicated that the status of his neg hx is unknown.    REVIEW OF SYSTEMS:  A comprehensive review of 10 systems was negative except for pertinent positives noted in HPI.    PHYSICAL EXAM:   Virtual visit. Patient appeared comfortable, and breathing was normal.    Lab Results   Component Value Date    CREATININE 1.94 (H) 01/13/2021     Lab Results   Component Value Date    ALKPHOS 85 01/13/2021    BILITOT 0.3 01/13/2021    BILIDIR <0.10 11/19/2020    PROT 6.2 01/13/2021    ALBUMIN 3.7 01/13/2021    ALT 21 01/13/2021    AST 21 01/13/2021      I personally spent 40 minutes face-to-face and non-face-to-face in the care of this patient, which includes all pre, intra, and post visit time on the date of service.     Barbette Merino MD MPH  Hospice and Palliative Medicine Fellow

## 2021-01-14 NOTE — Unmapped (Addendum)
Labs without evidence of recurrent DRESS syndrome, will defer to oncology if want to start prednisone for pruritus.     ----- Message from Gabriel Carina, MD sent at 01/13/2021  8:26 PM EDT -----  Regarding: RE: lab f/u  Yeah- I would defer to onc.  If they want to start it to treat pruritus ok but not for dress.     Thanks!    Fleet Contras   ----- Message -----  From: Warner Mccreedy, MD  Sent: 01/13/2021   5:10 PM EDT  To: Gabriel Carina, MD  Subject: FW: lab f/u                                      Hi Dr. Francia Greaves,   Looks like eos within normal limits, back down in normal range and Cr is stable (1.98-->1.94). Seems less likely DRESS is flaring, would it be reasonable at this point not to restart pred?    ----- Message -----  From: Warner Mccreedy, MD  Sent: 01/13/2021  11:34 AM EDT  To: Warner Mccreedy, MD  Subject: lab f/u                                          Follow-up labs   [facility]

## 2021-01-18 NOTE — Unmapped (Addendum)
I have contacted the patient to review/clarify the following:    ?? In conjunction with the patient's most recent clinic visit with [name] on 01/13/2021, [name] is in agreement with the patient returning to work so long as he is able to to qualify for light duty status.    ?? This update has been shared with the patient via an electronic letter that was sent to the patient via [facility].    Otherwise, the patient expressed understanding and had no other questions or concerns at this time.  He is in agreement with the plan of care outlined above and will contact clinic back sooner should he have any other issues.    No other actions taken and this updates been shared with the Leukemia Care Team.

## 2021-01-21 DIAGNOSIS — C9500 Acute leukemia of unspecified cell type not having achieved remission: Principal | ICD-10-CM

## 2021-01-21 MED ORDER — CARVEDILOL 12.5 MG TABLET
ORAL_TABLET | Freq: Two times a day (BID) | ORAL | 1 refills | 90.00000 days | Status: CP
Start: 2021-01-21 — End: ?

## 2021-01-21 NOTE — Unmapped (Signed)
The clinic has received notification from the patient's local [facility] regarding the following:    ?? A medication refill request for carvedilol 12.5 mg tablets, dispense 180 has been received to the clinic and will be evaluated by the provider team.    ?? This medication was last prescribed on 07/20/2020 by [name], NP.    ?? Moreover, Mr. Oboyle is in the process of coordinating a follow-up visit with his primary care provider at [facility], Dr. Dinah Beers.  ?? Unfortunately, Mr. Pherigo has not seen Dr. Luan Pulling since his initial diagnosis of ALL back in October 2021.    ?? Follow-up with the Leukemia Care Team remains tentatively scheduled for Thursday, February 11, 2021 with [name], NP and will include same-day repeat lab work.    Otherwise, the patient expressed verbal understanding and had no other questions or concerns at this time.  He is amenable to the plan outlined above and will contact clinic back sooner should he have any other issues.    No other actions taken and this updates been shared with Leukemia Care Team.

## 2021-01-22 DIAGNOSIS — R0602 Shortness of breath: Principal | ICD-10-CM

## 2021-01-25 ENCOUNTER — Ambulatory Visit: Admit: 2021-01-25 | Discharge: 2021-01-26 | Payer: MEDICAID

## 2021-01-25 DIAGNOSIS — R0600 Dyspnea, unspecified: Principal | ICD-10-CM

## 2021-01-25 MED ORDER — UMECLIDINIUM 62.5 MCG-VILANTEROL 25 MCG/ACTUATION POWDR FOR INHALATION
Freq: Every day | RESPIRATORY_TRACT | 11 refills | 30 days | Status: CP
Start: 2021-01-25 — End: ?

## 2021-01-25 MED ORDER — ALBUTEROL SULFATE HFA 90 MCG/ACTUATION AEROSOL INHALER
Freq: Four times a day (QID) | RESPIRATORY_TRACT | 11 refills | 0.00000 days | Status: CP | PRN
Start: 2021-01-25 — End: ?

## 2021-01-25 NOTE — Unmapped (Signed)
[facility] Specialty [facility] Pharmacist Visit     Adam [name] being seen by Adam Keith for a new patient visit with his wife who provides most of his history due to his hearing loss and that she primarily managing his medications.      They report he has had trouble breathing for awhile, but has recently gotten worse since his MRSA infection. Adam Keith made the distinction that he doesn't consider it shortness of breath, but rather that he cannot catch his breath. He has home O2, but has not needed to utilize it since switching from dasatinib to ponatinib which also caused fluid build-up.     Chief Complaint/Reason for referral: inability to catch his breath, prior history of COPD    OUTPATIENT MEDICATION LIST     Current Outpatient Medications on File Prior to Visit   Medication Sig   ??? acetaminophen (TYLENOL) 325 MG tablet Take 650 mg by mouth every six (6) hours as needed for pain. Not needing frequently   ??? amLODIPine (NORVASC) 10 MG tablet Take 1 tablet (10 mg total) by mouth daily.   ??? aspirin 81 MG chewable tablet Chew 1 tablet (81 mg total) daily.   ??? carvediloL (COREG) 12.5 MG tablet Take 1 tablet (12.5 mg total) by mouth Two (2) times a day.   ??? clonazePAM (KLONOPIN) 0.5 MG tablet Take 1 tablet (0.5 mg total) by mouth two (2) times a day as needed for anxiety or sleep. Usually nightly, rarely BID   ??? gabapentin (NEURONTIN) 300 MG capsule Take 1 capsule (300 mg total) by mouth Three (3) times a day. Taking differently - BID   ??? HYDROmorphone (DILAUDID) 2 MG tablet Take 1 tablet (2 mg total) by mouth every six (6) hours as needed for pain,moderate (4-6). Rarely uses, for breakthrough pain   ??? nortriptyline (PAMELOR) 50 MG capsule Take 2 capsules (100 mg total) by mouth nightly.   ??? OXYCONTIN 15 mg 12 hr crush resistant ER/CR tablet Take 1 tablet (15 mg total) by mouth Two (2) times a day.   ??? PONATinib (ICLUSIG) 30 mg tablet Take 1 tablet (30 mg total) by mouth daily. Swallow tablets whole. Do not crush, break, cut or chew tablets.   ??? sulfamethoxazole-trimethoprim (BACTRIM DS) 800-160 mg per tablet Take 1 tablet (160 mg of trimethoprim total) by mouth 2 times a day on Saturday, Sunday. For prophylaxis while on chemo.   ??? valACYclovir (VALTREX) 500 MG tablet Take 1 tablet (500 mg total) by mouth daily.     No current facility-administered medications on file prior to visit.       PULMONARY MEDICATIONS     Current pulmonary medications:   ??? None    Previously tried pulmonary medications:   ??? None     Other considerations:  ??? Antibiotic use: denies  ??? Oral steroid use: denies    MEDICATION MANAGEMENT   ??? Medications Management: Medication management assisted by wife  ??? Adherence: Excellent  ??? Access: No issues related to access   ??? New Prescriptions: N/A  ??? [facility]erred [facility]acy: [facility] [address] [place], [place]     Other: Performed Pubmed search to identify if TKI or specifically dasatinib or ponatinib could be associated with possible bronchiolitis obliterans (BO). Per pneumotox database dasatinib is associated most commonly with pleural effusion and less commonly bronchiolitis obliterans organizing pneumonia (BOOP) in the literature, though not BO. Ponatinib associated more so with pulmonary cardiovascular and  vascular manifestations according to Pneumotox. No association identified with active ALL and BO, more so post-BMT or lung transplant in addition to inhaled exposures.    I spent a total of 10 minutes face to face with the patient delivering clinical care and providing education/counseling. Medications reviewed in EPIC medication station and updated today by the clinical pharmacist practitioner.     Recommendations and medication-related problems were discussed directly with pulmonologist, Allayne Butcher, MD.    Electronically signed:  Prince Solian, PharmD, CPP  Clinical Pharmacist Practitioner  [facility]rosis/Pulmonary Clinic  [phone]    I am located on-site and the patient is located on-site for this visit.

## 2021-01-25 NOTE — Unmapped (Addendum)
TO DO:  - Try an inhaler today  - Using the Ellipta Inhaler Device ([email or web address]/)

## 2021-01-25 NOTE — Unmapped (Signed)
Pulmonary Clinic - Initial Visit    Referring Physician :  Park Breed  PCP:     [name], MD  Reason for Consult:   DOE      ASSESSMENT and PLAN     Adam [name] with B-ALL dx 01/21/20 currently on blinatumomab (anti-CD19/CD3) + ponatinib (TKI), COPD, DISH/DRESS 2/2 ceftaroline and cefepime, MRSA TV IE, right pleural space infection with persistent pain presenting for evaluation of shortness of breath.     COPD 2B:  - Start LABA/LAMA  - Repeat PFTs in 3 months  - Exacerbations: none so far  - Pulmonary Rehab:declines  - Lung Cancer Screening:not eligible  - Vaccines:declines  - Smoking cessation: Strongly encouraged but he declines.   - Hypercarbia/NIPPV screen: Declines currentlly  - Advanced therapies? Not indicated by PFTs      Vaccines:  Immunization History   Administered Date(s) Administered Comments   ??? COVID-19 VACCINE,MRNA(MODERNA)(PF)(IM) 02/25/2020    ???  03/23/2020    Deferred Date(s) Deferred Comments   ??? COVID-19 VACC,MRNA,(PFIZER)(PF)(IM) 01/24/2020    ??? Influenza Vaccine Quad (IIV4 PF) 33mo+ injectable 04/08/2020            Diagnoses and all orders for this visit:    Dyspnea, unspecified type  -     Ambulatory referral to Pulmonology        Plan of care was discussed with the patient who acknowledged understanding and is in agreement.    Patient will return to clinic in 3 months or sooner if needed.    This patient was seen and discussed with attending physician, Dr. Etta Quill who agrees with the assessment and plan above.     ZO:XWRU Minna Antis, [name], MD    Burman Riis Kishia Shackett MD  Pulmonary and Critical Care Fellow  01/25/21   ~~~~~    HISTORY:     History of Present Illness:  Adam [name] with a history of the above whom we are seeing in consultation requested by Park Breed for evaluation of DOE.     Patient developed DOE after his cancer diagnosis and chemotherapy. Trouble breathing since MRSA infection. Not SOB, more DOE. Takes shallower breaths due to pain in his right side. It takes him longer to recover. 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 Now he can't do a flight of stairs without stopping.    Currently smokes 2ppd and has done so for 10-20 years. Not interested in quitting for everyone else's sake.    Hearing loss, wife assists with meds and history.    Discussed therapies and implications of smoking. Recommended an inhaler as well as close follow-up for progression.     Past Medical History:  Past Medical History:   Diagnosis Date   ??? 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 BONE MARROW BIOPSY & ASPIRATION  01/21/2020        ??? CHG [place], CHEST,REAL TIME  11/20/2020    Procedure: ULTRASOUND, CHEST, REAL TIME WITH IMAGE DOCUMENTATION;  Surgeon: Jerelyn Charles, MD;  [facility];  Service: Pulmonary   ??? IR INSERT PORT AGE GREATER THAN 5 YRS  03/10/2020    IR INSERT PORT  AGE GREATER THAN 5 YRS 03/10/2020 [name], MD IMG VIR [facility]   ??? PR BRONCHOSCOPY,DIAGNOSTIC W LAVAGE Bilateral 07/20/2020    Procedure: BRONCHOSCOPY, RIGID OR FLEXIBLE, INCLUDE FLUOROSCOPIC GUIDANCE WHEN PERFORMED; W/BRONCHIAL ALVEOLAR LAVAGE WITH MODERATE SEDATION;  Surgeon: Dellis Filbert, MD;  Location: [facility];  Service: Pulmonary       Other History:  The social history and family history were personally reviewed and updated in the patient's electronic medical record.    Family History   Problem Relation Age of Onset   ??? Melanoma Neg Hx    ??? Basal cell carcinoma Neg Hx    ??? Squamous cell carcinoma Neg Hx      Social History     Socioeconomic History   ??? Marital status: Married   Tobacco Use   ??? Smoking status: Former Smoker     Packs/day: 2.00     Types: Cigarettes     Quit date: 01/20/2020 Years since quitting: 1.0   ??? Smokeless tobacco: Former Neurosurgeon     Types: Musician Use   ??? Vaping Use: Never used   Other Topics Concern   ??? Do you use sunscreen? No   ??? Tanning bed use? No   ??? Are you easily burned? Yes   ??? Excessive sun exposure? No   ??? Blistering sunburns? Yes     Social Determinants of Health     Financial Resource Strain: Low Risk    ??? Difficulty of Paying Living Expenses: Not very hard   Food Insecurity: No Food Insecurity   ??? Worried About Running Out of Food in the Last Year: Never true   ??? Ran Out of Food in the Last Year: Never true   Transportation Needs: No Transportation Needs   ??? Lack of Transportation (Medical): No   ??? Lack of Transportation (Non-Medical): No       Home Medications:  Current Outpatient Medications on File Prior to Visit   Medication Sig Dispense Refill   ??? acetaminophen (TYLENOL) 325 MG tablet Take 650 mg by mouth every six (6) hours as needed for pain.      ??? amLODIPine (NORVASC) 10 MG tablet Take 1 tablet (10 mg total) by mouth daily. 30 tablet 0   ??? aspirin 81 MG chewable tablet Chew 1 tablet (81 mg total) daily. 36 tablet 11   ??? carvediloL (COREG) 12.5 MG tablet Take 1 tablet (12.5 mg total) by mouth Two (2) times a day. 180 tablet 1   ??? clonazePAM (KLONOPIN) 0.5 MG tablet Take 1 tablet (0.5 mg total) by mouth two (2) times a day as needed for anxiety or sleep. 60 tablet 1   ??? gabapentin (NEURONTIN) 300 MG capsule Take 1 capsule (300 mg total) by mouth Three (3) times a day. 270 capsule 6   ??? HYDROmorphone (DILAUDID) 2 MG tablet Take 1 tablet (2 mg total) by mouth every six (6) hours as needed for pain,moderate (4-6). 30 tablet 0   ??? nortriptyline (PAMELOR) 50 MG capsule Take 2 capsules (100 mg total) by mouth nightly. 60 capsule 11   ??? OXYCONTIN 15 mg 12 hr crush resistant ER/CR tablet Take 1 tablet (15 mg total) by mouth Two (2) times a day. 56 tablet 0   ??? PONATinib (ICLUSIG) 30 mg tablet Take 1 tablet (30 mg total) by mouth daily. Swallow tablets whole. Do not crush, break, cut or chew tablets. 30 tablet 5   ??? sulfamethoxazole-trimethoprim (BACTRIM DS)  800-160 mg per tablet Take 1 tablet (160 mg of trimethoprim total) by mouth 2 times a day on Saturday, "Sunday. For prophylaxis while on chemo. 48 tablet 3   ??? valACYclovir (VALTREX) 500 MG tablet Take 1 tablet (500 mg total) by mouth daily. 90 tablet 11     No current facility-administered medications on file prior to visit.       Allergies:  Allergies as of 01/25/2021 - Reviewed 01/13/2021   Allergen Reaction Noted   ??? Bupropion hcl Other (See Comments) 12/23/2019   ??? Cefepime Rash 11/04/2020   ??? Ceftaroline fosamil Rash and Other (See Comments) 03/03/2020   ??? Dapsone Other (See Comments) and Anaphylaxis 02/29/2020   ??? Onion Anaphylaxis 01/26/2020   ??? Vancomycin analogues  03/16/2020   ??? Bismuth subsalicylate Nausea And Vomiting 08/07/2013   ??? Furosemide  04/13/2020       Review of Systems:  A comprehensive review of systems was completed and negative except as noted in HPI.    PHYSICAL EXAM:   BP 122/85 (BP Site: L Arm, BP Position: Sitting)  - Pulse 93  - Temp 36.3 ??C (97.3 ??F) (Temporal)  - Ht 187.4 cm (6' 1.78)  - Wt (!) 109.3 kg (241 lb)  - SpO2 95%  - BMI 31.13 kg/m??   GEN: NAD, sitting in chair, taciturn  EYES: EOMI, sclera anicteric  ENT: Trachea midline, MMM  CV: RRR, no murmurs appreciated  PULM: CTA B, normal WoB, no stridor, prolonged expiratory phase but no wheeze  ABD: soft, non-tender, non-distended  EXT: No edema  NEURO: Grossly Non-focal, moving all extremities normally  PSYCH: A+Ox3, appropriate  MSK: no obvious joint deformities of b/l hands      LABORATORY and RADIOLOGY DATA:     Pulmonary Function Tests/Interpretation:      10" /24/22 - My interp: moderate obstructive lung disease without significant bronchodilator response.       Pertinent Laboratory Data:    Personally reviewed in EMR    Pertinent Imaging Data:  Personally reviewed in EMR    CT Chest 8/17 to 9/26 improvement

## 2021-01-28 DIAGNOSIS — M791 Myalgia, unspecified site: Principal | ICD-10-CM

## 2021-01-28 DIAGNOSIS — R509 Fever, unspecified: Principal | ICD-10-CM

## 2021-01-28 NOTE — Unmapped (Unsigned)
THORACIC SURGERY NEW PATIENT CONSULT NOTE    Patient Care Team:  Dierdre Harness, MD as PCP - General  Guerry Bruin, MD as Referred to Provider (Hematology and Oncology)  Arvid Right, RN as Registered Nurse (Oncology Navigator)  [facility]  Belva Bertin, RN as Registered Nurse (Palliative Medicine)  Doroteo Glassman, MD as Consulting Physician (Palliative Medicine)  Ward Givens, PMHNP as Nurse Practitioner (Psychiatry)  Dario Ave as Nurse Practitioner (Hematology and Oncology)  Everlena Cooper, MD as Consulting Physician (Palliative Medicine)  Referring provider: Park Breed,*    Chief Complaint: MRSA infection with empyema, bilateral pulmonary nodules    History of Present Illness:    History obtained from chart review.    [name] who presents for evaluation and management of possible empyema in the setting of known MRSA infection and newly found pulmonary nodules. 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 He also has a history of HTN, is immunocompromised status, COVID infection, and is a former smoker using 2 packs per day, for ** years, quit date of 01/2020. He has had multiple episodes of MRSA pneumonia/bacteremia requiring intubation. In August he was admitted for a fever and right sided pleuritic chest pain. On 11/17/2020 he had a chest XR which was concerning for empyema, and a CT chest showing loculated pleural effusion. He underwent thoracentesis on 11/20/2020 which was unsuccessful. During this admission, he was also reported to have new pulmonary nodules which were found indecently on his CT. He was recommended for bronchoscopy and biopsy, however due to scheduling this was unable to be done. He has since been following with pulmonary disease in the outpatient setting. CT chest from 12/28/2020 shows ***. Thoracic surgery consultation is requested.     Leukemia diagnosed in 01/21/2020, currently receiving chemotherapy     Today, ***.     Otherwise, the patient has been doing generally well and denies other new or acute complaints. Specifically, the patient denies fevers, productive cough, hemoptysis, shortness of breath, weight loss and other symptoms of concern. He is able to walk *** miles per day and *** flights of stairs.  The ECOG performance status is {findings; ecog performance status:31780}.    REVIEW OF SYSTEMS  A comprehensive review of 10 systems was negative except for pertinent positives noted in HPI.      PAST MEDICAL HISTORY  Cardiopulmonary history:   The patient does have a history of HTN, MRSA PNA/bacterimea, COVID infection, tobacco abuse, COPD. He does not have a history of CHF, CAD, myocardial infarction, atrial fibrillation, valvular heart disease, pulmonary hypertension, interstitial lung disease.    General history:  The patient does have a history of leukemia complicated by septic shock, candida krusei fungemia, MRSA bacteremia and septic emobli, acute renal failure, immunosuppression. He has no cerebrovascular, or neuromuscular history. No endocrine/GI history. No history of mediastinal radiation. He is not on home oxygen. No history of dementia/neurocognitive dysfunction, major psychiatric disorder.     Other Past Medical History  Past Medical History:   Diagnosis Date   ??? 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 BONE MARROW BIOPSY & ASPIRATION  01/21/2020        ??? CHG [place], CHEST,REAL TIME  11/20/2020    Procedure: ULTRASOUND, CHEST, REAL TIME WITH IMAGE DOCUMENTATION;  Surgeon: Jerelyn Charles, MD;  Location: [facility];  Service: Pulmonary   ??? IR INSERT PORT AGE GREATER THAN 5 YRS  03/10/2020    IR INSERT PORT AGE GREATER THAN 5 YRS 03/10/2020 [name], MD IMG VIR H&V [facility]   ??? PR BRONCHOSCOPY,DIAGNOSTIC W LAVAGE Bilateral 07/20/2020    Procedure: BRONCHOSCOPY, RIGID OR FLEXIBLE, INCLUDE FLUOROSCOPIC GUIDANCE WHEN PERFORMED; W/BRONCHIAL ALVEOLAR LAVAGE WITH MODERATE SEDATION;  Surgeon: Dellis Filbert, MD;  Location: [facility];  Service: Pulmonary       FAMILY MEDICAL HISTORY  Family history of heart disease: {yes/no:21137}  Family history of lung cancer:  {yes/no:21137}     Family History   Problem Relation Age of Onset   ??? Melanoma Neg Hx    ??? Basal cell carcinoma Neg Hx    ??? Squamous cell carcinoma Neg Hx        SOCIAL HISTORY  Smoking history - {smoker non-smoker:42720}  Other Nicotine Use - {yes/no:21137}  Narcotic dependency - {yes/no:21137}  Alcohol abuse- {yes/no:21137}  Living status - {living situation & with whom:33006}    Social History     Socioeconomic History   ??? Marital status: Married   Tobacco Use   ??? Smoking status: Former Smoker     Packs/day: 2.00     Types: Cigarettes     Quit date: 01/20/2020     Years since quitting: 1.0   ??? Smokeless tobacco: Former Neurosurgeon     Types: Musician Use   ??? Vaping Use: Never used   Other Topics Concern   ??? Do you use sunscreen? No   ??? Tanning bed use? No   ??? Are you easily burned? Yes   ??? Excessive sun exposure? No   ??? Blistering sunburns? Yes     Social Determinants of Health     Financial Resource Strain: Low Risk    ??? Difficulty of Paying Living Expenses: Not very hard   Food Insecurity: No Food Insecurity   ??? Worried About Running Out of Food in the Last Year: Never true   ??? Ran Out of Food in the Last Year: Never true   Transportation Needs: No Transportation Needs   ??? Lack of Transportation (Medical): No   ??? Lack of Transportation (Non-Medical): No        MEDICATIONS  Current Outpatient Medications Medication Sig Dispense Refill   ??? acetaminophen (TYLENOL) 325 MG tablet Take 650 mg by mouth every six (6) hours as needed for pain.      ??? albuterol HFA 90 mcg/actuation inhaler Inhale 2 puffs every six (6) hours as needed for wheezing. 8 g 11   ??? amLODIPine (NORVASC) 10 MG tablet Take 1 tablet (10 mg total) by mouth daily. 30 tablet 0   ??? aspirin 81 MG chewable tablet Chew 1 tablet (81 mg total) daily. 36 tablet 11   ??? carvediloL (COREG) 12.5 MG tablet Take 1 tablet (12.5 mg total) by mouth Two (2) times a day. 180 tablet 1   ??? clonazePAM (KLONOPIN) 0.5 MG tablet Take 1 tablet (0.5 mg total) by mouth two (2) times a day as needed for anxiety or sleep. 60 tablet 1   ??? gabapentin (NEURONTIN) 300 MG capsule Take 1  capsule (300 mg total) by mouth Three (3) times a day. 270 capsule 6   ??? HYDROmorphone (DILAUDID) 2 MG tablet Take 1 tablet (2 mg total) by mouth every six (6) hours as needed for pain,moderate (4-6). 30 tablet 0   ??? nortriptyline (PAMELOR) 50 MG capsule Take 2 capsules (100 mg total) by mouth nightly. 60 capsule 11   ??? OXYCONTIN 15 mg 12 hr crush resistant ER/CR tablet Take 1 tablet (15 mg total) by mouth Two (2) times a day. 56 tablet 0   ??? PONATinib (ICLUSIG) 30 mg tablet Take 1 tablet (30 mg total) by mouth daily. Swallow tablets whole. Do not crush, break, cut or chew tablets. 30 tablet 5   ??? sulfamethoxazole-trimethoprim (BACTRIM DS) 800-160 mg per tablet Take 1 tablet (160 mg of trimethoprim total) by mouth 2 times a day on Saturday, Sunday. For prophylaxis while on chemo. 48 tablet 3   ??? umeclidinium-vilanteroL (ANORO ELLIPTA) 62.5-25 mcg/actuation inhaler Inhale 1 puff daily. 60 each 11   ??? valACYclovir (VALTREX) 500 MG tablet Take 1 tablet (500 mg total) by mouth daily. 90 tablet 11     No current facility-administered medications for this visit.       Blood thinners/antiplatelets: {yes/no:21137}    ALLERGIES  is allergic to bupropion hcl, cefepime, ceftaroline fosamil, dapsone, onion, vancomycin analogues, bismuth subsalicylate, and furosemide.      Physical Exam:  There were no vitals filed for this visit.    The patient appears well and is in no acute distress. The head is normocephalic and atraumatic with intact extraocular muscles and pupils that are equal, round, and reactive to light. The neck is supple without mass or thyromegaly. There is no cervical, supraclavicular, or axillary adenopathy appreciated. The chest is clear to ascultation and percussion bilaterally without rales, rhonchi, wheezing, or abnormal fremitus change. The heart is regular in rate and rhythm without obvious murmur, rub, gallop, or any JVD. The abdomen is soft without tenderness or distention, and there is no mass or hepatosplenomegaly appreciated. The extremities are warm without cyanosis, clubbing, edema, or calf tenderness. The patient is calm, alert, and oriented with a normal gait. There is no focal motor deficit and the cranial nerves are grossly intact.     LABS/DATA:  Notable for the following:     PFTs from ***  FEV1 ***%  DLCO ***%  TLC ***%  RV ***%    IMAGING:  I have personally viewed and interpreted the *** images from ***.     Assessment:  In summary, Mr. Bunt is a 40 y.o. male who presents with ***.       Plan:  - ***      Thank you for this kind referral. Please feel free to contact me with any questions or concerns.     Gita N. Juliene Pina, MD, MPH, FACS  Division of Cardiothoracic Surgery

## 2021-01-29 NOTE — Unmapped (Signed)
I have contacted the patient to review/clarify concerns addressed in a [facility] message that was received to the clinic earlier today that include the following:    ?? Unfortunately, Mr. Archila was unable to attend today's clinic visit with Dr. Juliene Pina with the oncology thoracic surgery team.    ?? Moreover, Mr. Oscar shared correspondence from [facility] [place] who administers his cancer coverage that highlights an inquiry into the following:  ?? Obtaining a copy of pathology reports and itemized billing statements identifying the 5 digit CPT procedure code pertaining to the following dates:  ?? 01/31/2020  ?? 02/26/2020  ?? 07/20/2020    ?? In addition, the guarantor is also requested a copy of the pharmacy charges associated with ponatinib being dispensed starting in August 2022.

## 2021-01-29 NOTE — Unmapped (Signed)
Wife reports that he has not been feeling good with a lot of muscle aches and weakness.   He may have a fever. She asked me for blood culture. We can see if someone will perform a RPP as well.

## 2021-02-01 NOTE — Unmapped (Signed)
[facility] Refill Coordination Note    Specialty Medication(s) to be Shipped:   Hematology/Oncology: Iclusig    Other medication(s) to be shipped: No additional medications requested for fill at this time     Adam Keith, DOB: 07-04-1980  Phone: [phone] (home)       All above HIPAA information was verified with patient.     Was a Nurse, learning disability used for this call? No    Completed refill call assessment today to schedule patient's medication shipment from the [facility] [phone]).  All relevant notes have been reviewed.     Specialty medication(s) and dose(s) confirmed: Regimen is correct and unchanged.   Changes to medications: Muneer reports no changes at this time.  Changes to insurance: No  New side effects reported not previously addressed with a pharmacist or physician: None reported  Questions for the pharmacist: No    Confirmed patient received a Conservation officer, historic buildings and a Surveyor, mining with first shipment. The patient will receive a drug information handout for each medication shipped and additional FDA Medication Guides as required.       DISEASE/MEDICATION-SPECIFIC INFORMATION        N/A    SPECIALTY MEDICATION ADHERENCE     Medication Adherence    Patient reported X missed doses in the last month: 0  Specialty Medication: Iclusig 30 mg  Patient is on additional specialty medications: No  Informant: patient              Were doses missed due to medication being on hold? No    Iclusig 30 mg: 7 days of medicine on hand       REFERRAL TO PHARMACIST     Referral to the pharmacist: Not needed      [facility]     Shipping address confirmed in Epic.     Delivery Scheduled: Yes, Expected medication delivery date: 02/04/21.     Medication will be delivered via UPS to the prescription address in Epic [place].    [name]   [facility] Specialty Technician

## 2021-02-02 ENCOUNTER — Ambulatory Visit: Admit: 2021-02-02 | Discharge: 2021-02-03 | Payer: MEDICAID

## 2021-02-02 ENCOUNTER — Ambulatory Visit: Admit: 2021-02-02 | Discharge: 2021-02-03 | Payer: MEDICAID | Attending: Surgery | Primary: Surgery

## 2021-02-02 ENCOUNTER — Ambulatory Visit
Admit: 2021-02-02 | Discharge: 2021-02-03 | Payer: MEDICAID | Attending: Infectious Disease | Primary: Infectious Disease

## 2021-02-02 DIAGNOSIS — R509 Fever, unspecified: Principal | ICD-10-CM

## 2021-02-02 DIAGNOSIS — M791 Myalgia, unspecified site: Principal | ICD-10-CM

## 2021-02-02 NOTE — Unmapped (Signed)
IMMUNOCOMPROMISED HOST INFECTIOUS DISEASE PROGRESS NOTE    Assessment/Plan:     [name] who presents for low grade fever.     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 krusei fungemia  - Prior chemotherapy:??GRAAPPH-2005??induction??with dasatinib??(vincristine, dexamethasone and dasatinib);??C1D1 01/24/2020; difficulty tolerating single-agent dasatinib  - Current chemotherapy:??blinatumomab (anti-CD19/CD3) + ponatinib (TKI)??s/p??C4 on 12/14/20; 01/2021 now on ponatinib monotherapy maintenance  - 01/11/21: Normocellular bone marrow (30% overall) with trilineage hematopoiesis and less than 1% blasts by manual aspirate differential; Flow cytometry MRD analysis reveals no definitive immunophenotypic evidence of residual B lymphoblastic leukemia; BCR-ABL p210 transcripts were detected at a level of 0.002 IS % ratio in bone marrow.    # Hypogammaglobulimia  - 11/01/20 IgG 266 declined IVIG  ??  Pertinent Co-morbidities  # COPD/empysema  #??DIHS/DRESS??ceftaroline??06/12/2020, relapse??after cefepime??11/02/2020  - per prior ID notes possible culprits ceftaroline,??posaconazole, dasatinib, sotrovimab  - 12/14/20 discontinued steroids  ????  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  Infection History  Active infections:??  #MRSA??RLL PNA complicated by??bacteremia, 11/01/2020  #Possible right empyema 11/13/2020, s/p failed thoracentesis 11/20/20  #RML opacity, presumed relapsed MRSA pneumonia 11/18/2020  - 8/19 Failed??thoracocentesis by IP  - 9/26 CT chest parenchyma without e/o infection, persistent but decreased loculated effusions on right  Rx: Treated with MRSA antibiotics until 01/04/2021    #Persistent right chest pain 12/29/20  - 02/02/2021 seen by Dr. Juliene Pina who feels that chest pain is appropriate in the setting of a medically managed empyema and risk of thoracotomy at this point likely outweighs the benefit given his clinical symptoms    # Low grade fever, weakness 01/28/21  - 02/02/21 CXR clear, blood cultures pending  ??  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  Antimicrobial Intolerance/allergy  Cefepime - DRESS  Ceftaroline -??DRESS  Dapsone - possible??agranulocytosis, per chart anaphylaxis  Vancomycin - probable ototoxicity (in combination??with furosemide)  Isavuconazole - elevated LFTs in the setting of TKI      RECOMMENDATIONS    Diagnostic  ??? Blood cultures today  ??? 2v CXR today  ??? AM cortisol when back for labs on 11/10  ??? We deferred the RPP and sputum sample today has he has no congestion or productive cough    Treatment  ??? Continue to monitor off MRSA directed antibiotics at this time.   ??? Continue inhaler as instructed by Dr. Windell Moment  ??? Case discussed with Dr. Juliene Pina who feels his pain is a expected and his CXR today reassuring.     Prophylaxis  ??? Continue valacyclovir and TMP/SMX as needed   ??? He needs influenza and BV covid vaccine; Evusheld ordered 12/2020 but not scheduled or discussed today    Follow up PRN, his wife reaches out if problems arise        Recommendations were communicated via shared medical record.    Subjective     Interval History:    Wife reached out to  me on 10/27 that she was worried about fatigue and evening fever.  He was not able to come for blood cultures on Friday since he developed acute vomiting and diarrhea that has since resolved. Since his last visit with me he saw pulmonary on 01/25/21. His PFTs are decreased. He was started on a new inhaler Anoro Ellipta. He coughs occasionally but it is not productive. His right chest hurts only when the oxy he takes for chronic pain wears off. No sinus congestion.     Medications:  Antimicrobials: tmp/smx, valacyclovir  Prior/Current immunomodulators: ponatinib  Other medications reviewed.    Objective     Physical Exam:    Const []  vital signs above    [x]  NAD, non-toxic appearance        Eyes [x]  Lids normal bilaterally, conjunctiva anicteric and noninjected OU     [] PERRL  [] EOMI        ENMT [x]  Normal appearance of external nose and ears, no nasal discharge        []  MMM, no lesions on lips or gums []  No thrush, leukoplakia, oral lesions  []  Dentition good []  Edentulous []  Dental caries present  []  Hearing normal  []  TMs with good light reflexes bilaterally         Neck [x]  Neck of normal appearance and trachea midline        []  No thyromegaly, nodules, or tenderness   []  Full neck ROM        Lymph []  No LAD in neck     []  No LAD in supraclavicular area     []  No LAD in axillae   []  No LAD in epitrochlear chains     []  No LAD in inguinal areas        CV []  RRR            []  No peripheral edema     []  Pedal pulses intact   []  No abnormal heart sounds appreciated   []  Extremities WWP         Resp [x]  Normal WOB at rest    [x]  No breathlessness with speaking, no coughing  []  CTA anteriorly    []  CTA bilaterally          GI [x]  Normal inspection, ND   []  NABS     []  No umbilical hernia on exam       []  No hepatosplenomegaly     []  Inspection of perineal and perianal areas normal        GU []  Normal external genitalia     [] No urinary catheter present in urethra   []  No CVA tenderness    []  No tenderness over renal allograft        MSK []  No clubbing or cyanosis of hands       []  No vertebral point tenderness  []  No focal tenderness or abnormalities on palpation of joints in RUE, LUE, RLE, or LLE  Focal point tenderness at right flank between ribs, no visible lesion on exam      Skin []  No rashes, lesions, or ulcers of visualized skin     []  Skin warm and dry to palpation   Mild erythema throughout      Neuro [x]  Face expression symmetric  []  Sensation to light touch grossly intact throughout    [x]  Moves extremities equally    [x]  No tremor noted        []  CNs II-XII grossly intact     []   DTRs normal and symmetric throughout [x]  Gait unremarkable        Psych [x]  Appropriate affect       [x]  Fluent speech         [x]  Attentive, good eye contact  []  Oriented to person, place, time          []  Judgment and insight are appropriate             Labs:  I reviewed and noted the following labs from 10/12: wbc nl 8.1, ALT normal, Cr stable 1.94    Microbiology:  No new micro    Imaging:  CXR today personally reviewed right pleural thickening remains but no focal pneumonia

## 2021-02-02 NOTE — Unmapped (Signed)
Thoracic Surgery Clinic Note      Assessment/Plan:     Adam [name], with B-cell ALL, MRSA bacteremia and RLL PNA c/b possible empyema, s/p failed thoracentesis by IP, who was referred to clinic today by his ID physician for further evaluation of potential relapse of MRSA infection of the pleural space. Given that he is nontoxic appearing, his imaging is reassuring, and that we would expect active MRSA infection to cause noticeable symptoms, do not think surgical intervention is necessary at this time. Instructed to return to clinic if has objective fever, positive BCx, positive sputum Cx, and/or has worsening interval changes on imaging. Recent PFT's reassuring that he would tolerate surgery from a respiratory standpoint. Extensive discussion was had with the patient, his wife, Dr. [name] (ID), and Dr. Juliene Pina about the indications for surgical intervention. All were in agreement and verbalized understanding.  Plan:  ?? Pt to obtain thermometer to accurately assess for fever  ?? RTC for surgical planning if fevers, has positive cultures, and/or worsening imaging    Subjective     Adam [name] with a PMH of B-cell acute lymphocytic leukemia (currently in remission and only on maintenance therapy), MRSA bacteremia and RLL PNA, possible empyema s/p failed thoracentesis by IP on 11/20/20, and COPD (followed by Pulmonology), who was referred by ID to thoracic surgery for evaluation of persistent right sided chest pain and c/f MRSA infection relapse of the pleural space. He recently completed abx therapy for the MRSA infection on 01/04/21 and is now having subjective fevers, right sided chest pain, generalized malaise, and myalgias. Per his wife, who is present with him in clinic today, the symptoms he is exhibiting are similar to when he initially presented with bacteremia earlier this year. He states that his symptoms of fatigue and myalgias are worse at night and exacerbated by activity. The right sided chest pain is induced by coughing and yawning and temporarily relieved with oxycodone. He reports a decreased appetite over the past couple of weeks. Also, of note, he is a current every day smoker. CT completed on 12/28/20 demonstrated stable, small volume, loculated collection in the posterior RLL. CXR done today and showed a stable RLL opacity with trace pleural effusion.      Social History     Tobacco Use   Smoking Status Former Smoker   ??? Packs/day: 2.00   ??? Types: Cigarettes   ??? Quit date: 01/20/2020   ??? Years since quitting: 1.0   Smokeless Tobacco Former Neurosurgeon   ??? Types: Chew       Objective     Physical Exam:  There were no vitals filed for this visit.  General: Well-developed, well-nourished male sitting in exam room in no acute distress.  HEENT: Normocephalic, sclera anicteric, EOMI, mucous membranes moist.  Neck: supple, trachea midline  Pulm: Normal work of breathing on room air. No audible wheezes  CVS: Normal rate, regular rhythm. No edema.  GI: Abdomen soft, non-tender, non-distended  MSK: Exremities warm and well-perfused, gait steady  Neuro: Awake, alert and oriented x3, no focal deficits  Psych: Speech clear and coherent. Mood and affect appropriate    Data Review:  Imaging: Radiology studies were personally reviewed    Joellen Jersey, MD  [facility] General Surgery PGY-1

## 2021-02-03 MED FILL — ICLUSIG 30 MG TABLET: ORAL | 30 days supply | Qty: 30 | Fill #2

## 2021-02-05 ENCOUNTER — Telehealth: Admit: 2021-02-05 | Discharge: 2021-02-06 | Payer: MEDICAID

## 2021-02-05 DIAGNOSIS — F331 Major depressive disorder, recurrent, moderate: Principal | ICD-10-CM

## 2021-02-05 DIAGNOSIS — F419 Anxiety disorder, unspecified: Principal | ICD-10-CM

## 2021-02-05 DIAGNOSIS — C9100 Acute lymphoblastic leukemia not having achieved remission: Principal | ICD-10-CM

## 2021-02-05 MED ORDER — CLONAZEPAM 0.5 MG TABLET
ORAL_TABLET | Freq: Two times a day (BID) | ORAL | 1 refills | 30 days | Status: CP | PRN
Start: 2021-02-05 — End: ?

## 2021-02-05 MED ORDER — OXYCONTIN 15 MG TABLET,CRUSH RESISTANT,EXTENDED RELEASE
ORAL_TABLET | Freq: Two times a day (BID) | ORAL | 0 refills | 28 days | Status: CP
Start: 2021-02-05 — End: ?

## 2021-02-05 NOTE — Unmapped (Signed)
[facility]  Psychiatry   Established Patient E&M Service - Outpatient       Assessment:    Adam Keith presents for follow-up evaluation. He states that his anxiety and depression is only a problem at night and is especially challenging on nights when his wife is at work. He states that his nortriptyline 100 mg is managing his depressive and anxiety symptoms well during the day. Encouraged him to reconnect with his therapist given his challenges in the evenings. He is resistant to a dose adjustment of his nortriptyline. He continues to use clonazepam at bedtime, but recognizes that he is taking it too late in the evening, which causes him to sleep until noon each day. Will reassess in 4 weeks.    Identifying Information:  Adam [name] with a history of PH+ B cell ALL, major depressive disorder, PTSD, anxiety, and insomnia.??He has a long struggle with intermittent depressive episodes with one suicide attempt at age 71.??    Risk Assessment:  An assessment of suicide and violence risk factors was performed as part of this evaluation and is not significantly changed from the last visit.   While future psychiatric events cannot be accurately predicted, the patient does not currently require acute inpatient psychiatric care and does not currently meet [facility] involuntary commitment criteria.      Plan:    Problem 1:??Major Depressive Disorder  Status of problem:??improving  Interventions:??  ?? Continue??nortriptyline 100 mg??at bedtime. He started this dose on 12/08/20 after completing his linezolid. He still has depression/anxiety in the evenings, but he does not want to make a dose change at this time.  ?? Monitor nortriptyline level given elevated creatinine levels. Nortriptyline level drawn on 12/21/20 was 66 (Ref range 70-170).    ?? Encouraged patient to reconnect with his local therapist. He struggles with anxiety and depressed mood in the evenings, which is worse when his wife is at work. Patient agreed that he should pursue therapy to work on his issues surrounding his difficulty being alone.     Problem 2:??Anxiety  Status of problem:??chronic??with moderate exacerbation only in the evenings  Interventions:??  ?? See nortriptyline plan above  ?? OK to continue clonazepam??0.5??mg tablets??BID PRN. Patient states that he has only been using this medication at bedtime. Last filled on 12/12/20 per PDMP review.  ?? We explored non-pharmacologic methods of managing anxiety. Patient believes that distraction may be helpful, especially when his wife is at work. He states that he is going to try playing video games on evenings when his wife is at work.  ??  Problem 3:??Insomnia  Status of problem:?? improved  Interventions:??  ?? See nortriptyline and clonazepam plans above  ?? Education??re: sleep hygiene      Psychotherapy provided:  No billable psychotherapy service provided.    Patient has been given this writer's contact information as well as the [facility] Psychiatry urgent line number. The patient has been instructed to call 911 for emergencies.      Subjective:    Chief complaint:  Follow-up psychiatric evaluation for depression and anxiety    Interval History:   He states that his depression and anxiety are now only a problem at night and especially problematic when his wife goes to work. She works as an Museum/gallery exhibitions officer and works 24 hour shifts every 4 days. He is resistant to making any medication changes because he is really doing well during the day and is still hopeful that  his nights will improve. He has been sleeping well at night (8-10 hours per night) and is concerned that he goes to bed too late due to elevated anxiety at bedtime, which leads him to sleep until noon the next day. He was willing to strategize with me on how to better manage his anxiety at night. He agreed to try taking his clonazepam earlier in the evenings (he has been waiting until midnight) to see if that will improve his anxiety in the evenings and help him relax before bed. He is pleased that his fatigue during the day is also improving. He does report that he did experience some passive suicidal thoughts when his wife was at work last Monday, but denies further dark thoughts.     Prior Psychiatric Medication Trials: bupropion (could not tolerate), duloxetine, mirtazapine (given during recent hospitalization), lorazepam      Psychiatric Review of Systems  ?? Depressive Symptoms:??depressed mood (only at night)  ?? Manic Symptoms:??N/A  ?? Psychosis Symptoms:??N/A  ?? Anxiety Symptoms:??excessive anxiety and worry, difficulty controlling worry  ?? Panic Symptoms:??denies  ?? PTSD Symptoms:??no recent issues  ?? Sleep disturbance:??denies   ?? Appetite:??good  ?? Exercise:??Unable to engage in an exercise routine.??      Objective:      Mental Status Exam:  Appearance:    Appears stated age, Well nourished and Clean/Neat   Motor:   No abnormal movements   Speech/Language:    Normal rate, volume, tone, fluency   Mood:   I'm doing alot better   Affect:   Calm, Cooperative and Euthymic   Thought process and Associations:   Logical, linear, clear, coherent, goal directed   Abnormal/psychotic thought content:     Denies SI, HI, self harm, delusions, obsessions, paranoid ideation, or ideas of reference   Perceptual disturbances:     Denies auditory and visual hallucinations, behavior not concerning for response to internal stimuli     Other:   insight and judgment intact     I personally spent 45 minutes face-to-face and non-face-to-face in the care of this patient, which includes all pre, intra, and post visit time on the date of service.    Visit was completed by video (or phone) and the appropriate disclaimer has been included below.    The patient reports they are currently: at home. I spent 30 minutes on the real-time audio and video with the patient on the date of service. I spent an additional 15 minutes on pre- and post-visit activities on the date of service.     The patient was physically located in [place] or a state in which I am permitted to provide care. The patient and/or parent/guardian understood that s/he may incur co-pays and cost sharing, and agreed to the telemedicine visit. The visit was reasonable and appropriate under the circumstances given the patient's presentation at the time.    The patient and/or parent/guardian has been advised of the potential risks and limitations of this mode of treatment (including, but not limited to, the absence of in-person examination) and has agreed to be treated using telemedicine. The patient's/patient's family's questions regarding telemedicine have been answered.     If the visit was completed in an ambulatory setting, the patient and/or parent/guardian has also been advised to contact their provider???s office for worsening conditions, and seek emergency medical treatment and/or call 911 if the patient deems either necessary.      Sheran Lawless, PMHNP  02/05/2021

## 2021-02-06 NOTE — Unmapped (Signed)
Comprehensive Cancer Support Program (CCSP) - Psychiatry Outpatient Clinic   After Visit Summary    It was a pleasure to see you today in the [facility]???s Comprehensive Cancer Support Program (CCSP). The CCSP is a multidisciplinary program dedicated to helping patients, caregivers, and families with cancer treatment, recovery and survivorship.      To schedule, cancel, or change your appointment:  Please call the [facility] schedulers at [phone], Monday through Friday 8AM - 5PM.  Someone will return your call within 24 hours.      If you have a question about your medicines or you need to contact your provider:  Please call the CCSP program coordinator, Myrene Galas, at [phone].     For after hours urgent issues, you may call [phone] or call the I need to talk line at [phone] anytime 24/7.    CCSP Patient and Family Resource Center: [phone].    CCSP Website:  [email or web address]    For prescription refills, please allow at least 24 hours (during business hours, M-F) for providers to call in refills to your pharmacy. We are generally unable to accommodate same-day requests for refills.     If you are taking any controlled substances (such as anxiety or sleep medications), you must use them as the directions say to use them. We generally do not provide early refills over the phone without clear reason, and it would be inappropriate to obtain the medications from other doctors. We routinely use the [place] controlled substance database to monitor prescription drug use.

## 2021-02-11 ENCOUNTER — Other Ambulatory Visit: Admit: 2021-02-11 | Discharge: 2021-02-11 | Payer: MEDICAID

## 2021-02-11 ENCOUNTER — Encounter: Admit: 2021-02-11 | Discharge: 2021-02-11 | Payer: MEDICAID | Attending: Internal Medicine | Primary: Internal Medicine

## 2021-02-11 ENCOUNTER — Ambulatory Visit: Admit: 2021-02-11 | Discharge: 2021-02-11 | Payer: MEDICAID | Attending: Adult Health | Primary: Adult Health

## 2021-02-11 ENCOUNTER — Ambulatory Visit: Admit: 2021-02-11 | Discharge: 2021-02-11 | Payer: MEDICAID

## 2021-02-11 DIAGNOSIS — C9101 Acute lymphoblastic leukemia, in remission: Principal | ICD-10-CM

## 2021-02-11 DIAGNOSIS — C9100 Acute lymphoblastic leukemia not having achieved remission: Principal | ICD-10-CM

## 2021-02-11 DIAGNOSIS — M791 Myalgia, unspecified site: Principal | ICD-10-CM

## 2021-02-11 LAB — COMPREHENSIVE METABOLIC PANEL
ALBUMIN: 4.1 g/dL (ref 3.4–5.0)
ALKALINE PHOSPHATASE: 81 U/L (ref 46–116)
ALT (SGPT): 22 U/L (ref 10–49)
ANION GAP: 7 mmol/L (ref 5–14)
AST (SGOT): 17 U/L (ref <=34)
BILIRUBIN TOTAL: 0.3 mg/dL (ref 0.3–1.2)
BLOOD UREA NITROGEN: 13 mg/dL (ref 9–23)
BUN / CREAT RATIO: 8
CALCIUM: 9.6 mg/dL (ref 8.7–10.4)
CHLORIDE: 112 mmol/L — ABNORMAL HIGH (ref 98–107)
CO2: 23 mmol/L (ref 20.0–31.0)
CREATININE: 1.58 mg/dL — ABNORMAL HIGH
EGFR CKD-EPI (2021) MALE: 56 mL/min/{1.73_m2} — ABNORMAL LOW (ref >=60)
GLUCOSE RANDOM: 108 mg/dL (ref 70–179)
POTASSIUM: 3.9 mmol/L (ref 3.4–4.8)
PROTEIN TOTAL: 6.3 g/dL (ref 5.7–8.2)
SODIUM: 142 mmol/L (ref 135–145)

## 2021-02-11 LAB — CBC W/ AUTO DIFF
BASOPHILS ABSOLUTE COUNT: 0.2 10*9/L — ABNORMAL HIGH (ref 0.0–0.1)
BASOPHILS RELATIVE PERCENT: 1.2 %
EOSINOPHILS ABSOLUTE COUNT: 0.7 10*9/L — ABNORMAL HIGH (ref 0.0–0.5)
EOSINOPHILS RELATIVE PERCENT: 4.6 %
HEMATOCRIT: 41.4 % (ref 39.0–48.0)
HEMOGLOBIN: 14.3 g/dL (ref 12.9–16.5)
LYMPHOCYTES ABSOLUTE COUNT: 2.1 10*9/L (ref 1.1–3.6)
LYMPHOCYTES RELATIVE PERCENT: 14.7 %
MEAN CORPUSCULAR HEMOGLOBIN CONC: 34.5 g/dL (ref 32.0–36.0)
MEAN CORPUSCULAR HEMOGLOBIN: 29.9 pg (ref 25.9–32.4)
MEAN CORPUSCULAR VOLUME: 86.6 fL (ref 77.6–95.7)
MEAN PLATELET VOLUME: 7.8 fL (ref 6.8–10.7)
MONOCYTES ABSOLUTE COUNT: 0.8 10*9/L (ref 0.3–0.8)
MONOCYTES RELATIVE PERCENT: 5.6 %
NEUTROPHILS ABSOLUTE COUNT: 10.5 10*9/L — ABNORMAL HIGH (ref 1.8–7.8)
NEUTROPHILS RELATIVE PERCENT: 73.9 %
PLATELET COUNT: 238 10*9/L (ref 150–450)
RED BLOOD CELL COUNT: 4.78 10*12/L (ref 4.26–5.60)
RED CELL DISTRIBUTION WIDTH: 16.6 % — ABNORMAL HIGH (ref 12.2–15.2)
WBC ADJUSTED: 14.2 10*9/L — ABNORMAL HIGH (ref 3.6–11.2)

## 2021-02-11 LAB — URIC ACID: URIC ACID: 7 mg/dL

## 2021-02-11 LAB — CORTISOL: CORTISOL TOTAL: 26.4 ug/dL

## 2021-02-11 LAB — LACTATE DEHYDROGENASE: LACTATE DEHYDROGENASE: 225 U/L (ref 120–246)

## 2021-02-11 NOTE — Unmapped (Deleted)
Created By: Darron Doom  Date Created: 2021-02-10   Participant: 18      AE Term Start date End date Severity SAE? Relationship to study treatment Action Taken with Study Treatment: Outcome   Hypoxic Respiratory Failure 01-28-20 02-28-20 Severe Y Unrelated N/A Resolved   Acutre Renal Failure 01-29-20 04-01-20 Severe Y Unrelated N/A Resolved   Acute Liver Dysfunction 01-29-20 04-01-20 Moderate N Unrelated N/A Resolved   Hypernatremia 02-03-20 04-01-20 Moderate N Unrelated N/A Resolved   Oral Mucositis 02-04-20 03-15-20 Moderate N Unrelated N/A Resolved   Diarrhea 02-04-20 03-16-20 Moderate N Unrelated N/A Resolved   Candidemia 02-06-20 04-15-20 Moderate Y Unrelated N/A Recovering/Resolving   Hiccups 02-06-20 02-19-20 Moderate N Unrelated N/A Resolved   Back Rash 02-10-20 03-21-20 Mild  N Unrelated N/A Resolved   Deep Tissue Injury Coccyx Stage 3 02-11-20 04-08-20 Moderate N Unrelated N/A Resolved   Pulmonary Edema 02-14-20 04-15-20 Moderate N Unrelated N/A Recovering/Resolving   Red Blood Cell Antibody Positive 02-14-20 04-15-20 Mild  N Unrelated N/A Not Resolved   Insomnia 02-19-20 03-22-20 Moderate N Unrelated N/A Recovering/Resolving   Dry Mucous Membranes 02-19-20 04-08-20 Moderate N Unrelated N/A Resolved   Neutropenia, Drug-Induced 02-24-20 03-05-20 Moderate N Unrelated N/A Resolved   Hearing Loss Audiology Eval-Bilateral Conductive HL and Mild-Mod Right SN HL 02-25-20 04-15-20 Moderate Y Unrelated N/A Not Resolved   Contact Dermatitis 02-27-20 03-07-20 Mild  N Unrelated N/A Resolved   Depression/Anxiety 03-17-20 04-15-20 Moderate N Unrelated N/A Recovering/Resolving       Additional details and classfication reasoning can be found the Master AE Log.  File name: [email or web address] AE Log_2022-11-08

## 2021-02-11 NOTE — Unmapped (Signed)
[facility]    Patient Name: Adam Keith  Patient Age: 40 y.o.  Encounter Date: 02/11/2021    Primary Care Provider:  [name], MD    Referring Physician:  Guerry Bruin, MD  [address]  [id number] [facility]  [place],  [place] [address]    Reason for visit:  Ph+ B-ALL follow up to consider timing of therapy change    Assessment:  A 40 y.o. 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 The course was complicated by septic shock, candida krusei fungemia, MRSA bacteremia with septic emboli c/b acute renal failure and respiratory distress requiring dialysis and intubation.  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  He subsequently had difficulty tolerating single agent dasatinib (as a bridge to planned ponatinib-blinatumomab--had fluid retention and pleural effusion).  He then completed  4 cycles of ponatinib-blinatumomab, initiated for treatment of MRD in the setting of poor tolerance of standard therapy. He is now MRD-negative with BCR-ABL level of 0.002% on marrow.    Patient is now on ponatinib 30mg  monotherapy. He is tolerating well and feeling well overall. WBC/ANC are slightly elevated but other cell counts are normal. 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 We discussed starting with light-duty as a step towards full-duty.    With the slightly elevated WBC/ANC and no sign of infection I would like a repeat lab check in 1-2 weeks.      Plan and Recommendations:  Ph+ B-ALL: CSF with rare blasts 12/6, now in CR (low level BCR-ABL) and no blasts   ?? Reduce dose of ponatinib from 30 mg to 15 mg if BCR-ABL is negative  ?? Continue aspirin while on ponatinib  -  BCR-ABL q 3 months Next in January    DRESS: followed by Dr. 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 Now s/p dialysis  - stable Cr appears to have developed CKD due to incompletely healed acute injury.  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 [name] and Dr. Maple Mirza following patient.   **Hx Candidemia during induction - now s/p Cresemab  - followed by Dr. [name]  **Hx Covid-19   - symptoms now resolved    Symptom management: bowel regimen as listed in medication list, analgesics as listed in medication list or symptoms reviewed and due to acceptable control, no changes made  **Peripheral neuropathy, sensory and motor  - Grade 1-2 monitor  **Chronic Back Pain, complicated by hospital stay and procedures  - Followed by Dr. Genice Rouge, Palliative Care  **Depression/anxiety: met with [name], [facility]  - nortriptyline  - PRN clonazepam  - has not been using PRN hydroxyzine    Venous access: I recommend the following change for venous access : PICC line removal     Supportive Care Recommendations:  We recommend based on the patient???s underlying diagnosis and treatment history the following supportive care:      1. Antimicrobial prophylaxis:    - Viral - valacyclovir 500mg  daily  - Bacterial - levofloxacin 500 mg if  ANC <0.5  - Fungal - on treatment isavuconazole  - PJP - Bactrim DS BID Sat/Sun  -Evusheld 04/21/2020-recommend another dose to achieve FDA recommendation update.  - recommend Annual flu shot - declines thus far  - recommend updated Covid vaccine - declines    2. Blood product support:  I recommend the following intervals for laboratory monitoring to determine transfusion needs and monitor for hematologic effects of therapy and underlying disease: no routine monitoring outside of clinic follow-up    Leukoreduced blood products are required. Irradiated blood products are preferred, but in case of urgent transfusion needs non-irradiated blood products may be used:     -  RBC transfusion threshold: transfuse 2 units for Hgb < 8 g/dL.  -  Platelet transfusion threshold: transfuse 1 unit of platelets for platelet count < 10, or for bleeding or need for invasive procedure.    3. Hematopoietic growth factor support: none      Care coordination: The next [facility]uk[facility] Follow up requested:  - see notes above      These services include the following elements of medical decision making:  addressing a hematologic cancer that poses a threat to life and/or bone marrow function  interpretation of diagnostic test reports or ordering of diagnostic tests and independent interpretation of diagnostic tests  High risk of morbidity due to drug therapy requiring monitoring for toxicity or decisions regarding goals and continuation of antineoplastic therapy      ______________________________________________________________________    I personally spent 40 minutes face-to-face and non-face-to-face in the care of this patient, which includes all pre, intra, and post visit time on the date of service.    [name], AGPCNP-BC  Nurse Practitioner  Hematology/Oncology  [facility]    Leukemia Program  Division of Hematology/Oncology  [facility]    Nurse Navigator (non-clinical trial patients): Colletta Maryland, RN        Tel. [phone]       Fax. [phone]  Toll-free appointments: [phone]  Scheduling assistance: [phone]  After hours/weekends: [phone] (ask for adult hematology/oncology on-call)          History of Present Illness:  We had the pleasure of seeing Adam Keith in the Leukemia Clinic at the [place] [facility] [place] on 02/11/2021.  He is a 40 y.o. male with Ph+ B-ALL.          His oncologic history is as follows:      Oncology History Overview Note   Referring/Local Oncologist: None    Diagnosis:Ph+ ALL    Genetics:    Karyotype/FISH:Abnormal Karyotype: 46,XY,t(9;22)(q34;q11.2)[1]/45,XY,der(7;9)(q10;q10)t(9;22)(q34;q11.2),der(22)t(9;22)[11]/46,sdl,+der(22)t(9;22)[5]/46,XY[3]     Abnormal FISH: A BCR/ABL1 interphase FISH assay shows an abnormal signal pattern in 97% of the 100 cells scored. 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  The findings support a diagnosis of leukemia and have implications for targeted therapy and for monitoring residual disease.      Molecular Genetics:  BCR-ABL1 p210 transcripts were detected at a level of 46.479 IS% ratio in bone marrow.  BCR-ABL1 p190 transcripts were detected at a level of 4 in 100,000 cells in bone marrow.    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)   01/23/2020 Initial Diagnosis    Acute lymphoblastic leukemia (ALL) not  having achieved remission (CMS-HCC)     01/24/2020 - 04/02/2020 Chemotherapy    IP/OP LEUKEMIA GRAAPH-2005 + RITUXIMAB < 60 YO  rituximab hypercvad (odd and even course)     02/24/2020 Remission    CR with PCR MRD+; marrow showing 70% blasts with <1% blasts, negative flow MRD, BCR-ABL p210 PCR 0.197%; CNS negative for blasts     03/09/2020 Progression    CSF - rare blast ID'ed - IT chemo given    03/13/20 - IT chemo, CSF negative     04/16/2020 Biopsy    BM Bx with 40-50% cellularity, <1% blasts, MRD flow cytometry negative, BCR-ABL p210 transcripts 0.036%     05/28/2020 - 05/28/2020 Chemotherapy    OP AML - CNS THERAPY (INTRATHECAL CYTARABINE, INTRATHECAL METHOTREXATE, OR INTRATHECAL TRIPLE)  Select one of the following: cytarabine IT 100 mg with hydrocortisone 50 mg, cytarabine IT 40 mg with methotrexate 15 mg with hydrocortisone 50 mg, OR methotrexate IT 12 mg with hydrocortisone 50 mg     06/19/2020 -  Chemotherapy    IP/OP LEUKEMIA BLINATUMOMAB 7-DAY INFUSION (MINIMAL RESIDUAL DISEASE; WT >= 22 KG) (HOME INFUSION)      Cycles 1*-4: Blinatumomab 28 mcg/day Days 1-28 of 6-week cycle.  *Given in the inpatient setting on Days 1-3 on Cycle 1 and Days 1-2 on Cycle 2, while other treatment days are given in the outpatient setting.    Cycle 1 MRD Dosing     07/18/2020 Adverse Reaction    Hospitalization: fevers. Pneumonia     07/23/2020 Biopsy    Diagnosis  Bone marrow, right iliac, aspiration and biopsy  -   Normocellular bone marrow (50%) with trilineage hematopoiesis and 1% blasts by manual aspirate differential  -   Flow cytometry MRD analysis reveals no definitive immunophenotypic evidence of residual B lymphoblastic leukemia   - BCR-ABL p210 0.006%         08/10/2020 -  Chemotherapy    Cycle 2 blinatumomab-ponatinib  Ponatinib 30mg     1 IT per cycle     09/21/2020 Adverse Reaction    Covid-19, symptomatic but not requiring hospitalization.     10/06/2020 -  Chemotherapy    Cycle 3 blinatumomab-ponatinib  Ponatinib 30mg        11/01/2020 Adverse Reaction    Hospitalization: MRSA Pneumonia requiring intubation    Blinatumomab stopped.  Ponatinib held until he stabilized.       12/14/2020 -  Chemotherapy    Cycle 4 blinatumomab 28 mcg/day days 1-28, ponatinib 30 mg per day IT triple therapy day 29     ALL (acute lymphoblastic leukemia) (CMS-HCC)   06/11/2020 -  Chemotherapy    IP/OP LEUKEMIA BLINATUMOMAB 7-DAY INFUSION (MINIMAL RESIDUAL DISEASE; WT >= 22 KG) (HOME INFUSION)  Cycles 1*-4: Blinatumomab 28 mcg/day Days 1-28 of 6-week cycle.  *Given in the inpatient setting on Days 1-3 on Cycle 1 and Days 1-2 on Cycle 2, while other treatment days are given in the outpatient setting.     09/21/2020 Initial Diagnosis    ALL (acute lymphoblastic leukemia) (CMS-HCC)         Interim History:  Since last seen here, doing well. He is getting more active home. He works a lot in Environmental consultant. No fevers. Breathing - no complaints. Rarely uses the PRN albuterol inhaler.  He is eager to start working - he wants to start full duty but his wife is concerned that his strength/stamina have not recovered enough yet. He agrees that he is still weaker than  he needs to be to work independently in the [employer]        Past Medical, Surgical and Family History were reviewed and pertinent updates were made in the Electronic Medical Record      ECOG Performance Status: 1    Medications:    Current Outpatient Medications   Medication Sig Dispense Refill   ??? acetaminophen (TYLENOL) 325 MG tablet Take 650 mg by mouth every six (6) hours as needed for pain.      ??? albuterol HFA 90 mcg/actuation inhaler Inhale 2 puffs every six (6) hours as needed for wheezing. 8 g 11   ??? amLODIPine (NORVASC) 10 MG tablet Take 1 tablet (10 mg total) by mouth daily. 30 tablet 0   ??? aspirin 81 MG chewable tablet Chew 1 tablet (81 mg total) daily. 36 tablet 11   ??? carvediloL (COREG) 12.5 MG tablet Take 1 tablet (12.5 mg total) by mouth Two (2) times a day. 180 tablet 1   ??? clonazePAM (KLONOPIN) 0.5 MG tablet Take 1 tablet (0.5 mg total) by mouth two (2) times a day as needed for anxiety or sleep. 60 tablet 1   ??? gabapentin (NEURONTIN) 300 MG capsule Take 1 capsule (300 mg total) by mouth Three (3) times a day. 270 capsule 6   ??? HYDROmorphone (DILAUDID) 2 MG tablet Take 1 tablet (2 mg total) by mouth every six (6) hours as needed for pain,moderate (4-6). 30 tablet 0   ??? nortriptyline (PAMELOR) 50 MG capsule Take 2 capsules (100 mg total) by mouth nightly. 60 capsule 11   ??? OXYCONTIN 15 mg 12 hr crush resistant ER/CR tablet Take 1 tablet (15 mg total) by mouth Two (2) times a day. 56 tablet 0   ??? PONATinib (ICLUSIG) 30 mg tablet Take 1 tablet (30 mg total) by mouth daily. Swallow tablets whole. Do not crush, break, cut or chew tablets. 30 tablet 5   ??? sulfamethoxazole-trimethoprim (BACTRIM DS) 800-160 mg per tablet Take 1 tablet (160 mg of trimethoprim total) by mouth 2 times a day on Saturday, Sunday. For prophylaxis while on chemo. 48 tablet 3   ??? umeclidinium-vilanteroL (ANORO ELLIPTA) 62.5-25 mcg/actuation inhaler Inhale 1 puff daily. 60 each 11   ??? valACYclovir (VALTREX) 500 MG tablet Take 1 tablet (500 mg total) by mouth daily. 90 tablet 11     No current facility-administered medications for this visit.       Vital Signs:  Vitals:    02/11/21 0810   BP: (S) 154/94   Pulse: 96   Resp: 16   Temp: 36.4 ??C (97.5 ??F)   SpO2: 99%         Relevant Physical Exam Findings:  General: Resting in no apparent distress, accompanied by spouse  HEENT:  Clear sclera, conjunctiva, mask in place  LYMPH:  no palpable cervical, supraclavicular, axillary, or inguinal nodes  CARDAC: RRR, no R,M,Gs, no pitting peripheral edema  RESP: nonlabored, bilaterally CTA  GI: Soft, nontender, active bowel sounds, no hepatic or splenomegaly  NEURO: alert, and oriented x4, steady gait, no focal deficits  PSYCH: appropriate, mildly anxious  DERM: no visible rashes, lesions  LINE: PICC      Relevant Laboratory, radiology and pathology results:  I personally viewed the most recent internal records, laboratory results, hematopathology report and molecular pathology reports  and discussed the available results with the patient and family  A summary of results follows:  Lab on 02/11/2021   Component Date Value Ref Range Status   ???  Sodium 02/11/2021 142  135 - 145 mmol/L Final   ??? Potassium 02/11/2021 3.9  3.4 - 4.8 mmol/L Final   ??? Chloride 02/11/2021 112 (A) 98 - 107 mmol/L Final   ??? CO2 02/11/2021 23.0  20.0 - 31.0 mmol/L Final   ??? Anion Gap 02/11/2021 7  5 - 14 mmol/L Final   ??? BUN 02/11/2021 13  9 - 23 mg/dL Final   ??? Creatinine 02/11/2021 1.58 (A) 0.60 - 1.10 mg/dL Final   ??? BUN/Creatinine Ratio 02/11/2021 8   Final   ??? eGFR CKD-EPI (2021) Male 02/11/2021 56 (A) >=60 mL/min/1.58m2 Final    eGFR calculated with CKD-EPI 2021 equation in accordance with [employer] and [employer] of Nephrology Task Force recommendations.   ??? Glucose 02/11/2021 108  70 - 179 mg/dL Final   ??? Calcium 16/01/9603 9.6  8.7 - 10.4 mg/dL Final   ??? Albumin 54/12/8117 4.1  3.4 - 5.0 g/dL Final   ??? Total Protein 02/11/2021 6.3  5.7 - 8.2 g/dL Final   ??? Total Bilirubin 02/11/2021 0.3  0.3 - 1.2 mg/dL Final   ??? AST 14/78/2956 17  <=34 U/L Final   ??? ALT 02/11/2021 22  10 - 49 U/L Final   ??? Alkaline Phosphatase 02/11/2021 81  46 - 116 U/L Final   ??? LDH 02/11/2021 225  120 - 246 U/L Final   ??? Uric Acid 02/11/2021 7.0  3.7 - 9.2 mg/dL Final   ??? Blood Type 02/11/2021 O POS   Final   ??? Select Screen 3 02/11/2021 NEG   Final    Patient has a history of red cell antibodies   ??? Cortisol 02/11/2021 26.4  See Comment ug/dL Final   ??? WBC 21/30/8657 14.2 (A) 3.6 - 11.2 10*9/L Final   ??? RBC 02/11/2021 4.78  4.26 - 5.60 10*12/L Final   ??? HGB 02/11/2021 14.3  12.9 - 16.5 g/dL Final   ??? HCT 84/69/6295 41.4  39.0 - 48.0 % Final   ??? MCV 02/11/2021 86.6  77.6 - 95.7 fL Final   ??? MCH 02/11/2021 29.9  25.9 - 32.4 pg Final   ??? MCHC 02/11/2021 34.5  32.0 - 36.0 g/dL Final   ??? RDW 28/41/3244 16.6 (A) 12.2 - 15.2 % Final   ??? MPV 02/11/2021 7.8  6.8 - 10.7 fL Final   ??? Platelet 02/11/2021 238  150 - 450 10*9/L Final   ??? Neutrophils % 02/11/2021 73.9  % Final   ??? Lymphocytes % 02/11/2021 14.7  % Final   ??? Monocytes % 02/11/2021 5.6  % Final   ??? Eosinophils % 02/11/2021 4.6  % Final   ??? Basophils % 02/11/2021 1.2  % Final   ??? Absolute Neutrophils 02/11/2021 10.5 (A) 1.8 - 7.8 10*9/L Final   ??? Absolute Lymphocytes 02/11/2021 2.1  1.1 - 3.6 10*9/L Final   ??? Absolute Monocytes 02/11/2021 0.8  0.3 - 0.8 10*9/L Final   ??? Absolute Eosinophils 02/11/2021 0.7 (A) 0.0 - 0.5 10*9/L Final   ??? Absolute Basophils 02/11/2021 0.2 (A) 0.0 - 0.1 10*9/L Final   ??? Anisocytosis 02/11/2021 Slight (A) Not Present Final     Chest X-ray PA and lateral ??Increased interstitial edema versus atypical infection/inflammation.  ??  Small right pleural effusion.    Diagnosis   Date Value Ref Range Status   01/11/2021   Final    A:  Cerebrospinal fluid, cytospin review and flow cytometry   -  No blasts identified by morphology or  flow cytometric analysis    This electronic signature is attestation that the pathologist personally reviewed the submitted material(s) and the final diagnosis reflects that evaluation.       Diagnosis   Date Value Ref Range Status   01/11/2021   Final    A:  Cerebrospinal fluid, cytospin review and flow cytometry   -  No blasts identified by morphology or flow cytometric analysis    This electronic signature is attestation that the pathologist personally reviewed the submitted material(s) and the final diagnosis reflects that evaluation.     01/11/2021   Final    Bone marrow, left iliac, aspiration and biopsy  -  Normocellular bone marrow (30% overall) with trilineage hematopoiesis and less than 1% blasts by manual aspirate differential  -   Flow cytometry MRD analysis reveals no definitive immunophenotypic evidence of residual B lymphoblastic leukemia (see Comment)     -  See linked reports for associated Ancillary Studies.      This electronic signature is attestation that the pathologist personally reviewed the submitted material(s) and the final diagnosis reflects that evaluation.     09/18/2020   Final    A:  Cerebrospinal fluid, cytospin review    -  Bloody specimen with no blasts identified    This electronic signature is attestation that the pathologist personally reviewed the submitted material(s) and the final diagnosis reflects that evaluation.

## 2021-02-11 NOTE — Unmapped (Signed)
0740 -  Blood drawn from Left hand with butterfly needle by Yisroel Ramming, RN.  Pt declined PIV.  Specimens sent to lab for analysis.

## 2021-02-11 NOTE — Unmapped (Cosign Needed)
DISRUPT - A Randomized, Double-Blind, Placebo-Controlled Study of the Efficacy and Safety of a Single Dose of Exebacase in Adult Patients Receiving Standard-of-Care Antibiotics for the Treatment of Staphylococcus aureus Bloodstream Infections (Bacteremia), Including Right-Sided Infective Endocarditis    IRB PPIRJ #[id number]     PI: Darlen Round, MD  Study Coordinator: Ivette Loyal    Adverse Event Summary Log           Created By: Darron Doom  Date Created: 2021-02-10   Participant: [id number]    AE Term Start date End date Severity SAE? Relationship to study treatment Action Taken with Study Treatment: Outcome   Hypoxic Respiratory Failure 01-28-20 02-28-20 Severe Y Unrelated N/A Resolved   Acutre Renal Failure 01-29-20 04-01-20 Severe Y Unrelated N/A Resolved   Acute Liver Dysfunction 01-29-20 04-01-20 Moderate N Unrelated N/A Resolved   Hypernatremia 02-03-20 04-01-20 Moderate N Unrelated N/A Resolved   Oral Mucositis 02-04-20 03-15-20 Moderate N Unrelated N/A Resolved   Diarrhea 02-04-20 03-16-20 Moderate N Unrelated N/A Resolved   Candidemia 02-06-20 04-15-20 Moderate Y Unrelated N/A Recovering/Resolving   Hiccups 02-06-20 02-19-20 Moderate N Unrelated N/A Resolved   Back Rash 02-10-20 03-21-20 Mild  N Unrelated N/A Resolved   Deep Tissue Injury Coccyx Stage 3 02-11-20 04-08-20 Moderate N Unrelated N/A Resolved   Pulmonary Edema 02-14-20 04-15-20 Moderate N Unrelated N/A Recovering/Resolving   Red Blood Cell Antibody Positive 02-14-20 04-15-20 Mild  N Unrelated N/A Not Resolved   Insomnia 02-19-20 03-22-20 Moderate N Unrelated N/A Recovering/Resolving   Dry Mucous Membranes 02-19-20 04-08-20 Moderate N Unrelated N/A Resolved   Neutropenia, Drug-Induced 02-24-20 03-05-20 Moderate N Unrelated N/A Resolved   Hearing Loss Audiology Eval-Bilateral Conductive HL and Mild-Mod Right SN HL 02-25-20 04-15-20 Moderate Y Unrelated N/A Not Resolved   Contact Dermatitis 02-27-20 03-07-20 Mild  N Unrelated N/A Resolved   Depression/Anxiety 03-17-20 04-15-20 Moderate N Unrelated N/A Recovering/Resolving     Additional details and classfication reasoning can be found the Master AE Log.  File name: DISRUPT_Master AE Log_2022-11-08

## 2021-02-11 NOTE — Unmapped (Signed)
I would like you to get a lab check in 1-2 weeks at [facility] to check your CBC. The [facility] are a little elevated so I want to follow those more closely.    Dr. Senaida Ores or I will see you back in 1 month. Plan monthly visits for now and BCR-ABL likely every 3 months.    I recommend light-duty work for now, building toward full-duty.

## 2021-02-11 NOTE — Unmapped (Signed)
[facility] order for CBC/differential placed. Plan for collection in 1-2 weeks.    [name], AGPCNP-BC  Nurse Practitioner  Hematology/Oncology  [facility]

## 2021-02-15 MED ORDER — NORTRIPTYLINE 75 MG CAPSULE
ORAL_CAPSULE | 4 refills | 0 days
Start: 2021-02-15 — End: ?

## 2021-02-22 NOTE — Unmapped (Signed)
[facility] Refill Coordination Note    Specialty Medication(s) to be Shipped:   Hematology/Oncology: Iclusig    Other medication(s) to be shipped: No additional medications requested for fill at this time     Adam Keith, DOB: [birth year]  Phone: [phone] (home)       All above HIPAA information was verified with patient.     Was a Nurse, learning disability used for this call? No    Completed refill call assessment today to schedule patient's medication shipment from the [facility] [phone]).  All relevant notes have been reviewed.     Specialty medication(s) and dose(s) confirmed: Regimen is correct and unchanged.   Changes to medications: Adam Keith reports no changes at this time.  Changes to insurance: No  New side effects reported not previously addressed with a pharmacist or physician: None reported  Questions for the pharmacist: No    Confirmed patient received a Conservation officer, historic buildings and a Surveyor, mining with first shipment. The patient will receive a drug information handout for each medication shipped and additional FDA Medication Guides as required.       DISEASE/MEDICATION-SPECIFIC INFORMATION        N/A    SPECIALTY MEDICATION ADHERENCE     Medication Adherence    Patient reported X missed doses in the last month: 0  Specialty Medication: Iclusig 30 mg  Patient is on additional specialty medications: No  Informant: spouse              Were doses missed due to medication being on hold? No    Iclusig 30 mg: 14 days of medicine on hand       REFERRAL TO PHARMACIST     Referral to the pharmacist: Not needed      [facility]     Shipping address confirmed in Epic.     Delivery Scheduled: Yes, Expected medication delivery date: 03/05/21.     Medication will be delivered via UPS to the prescription address in Epic [place].    Adam Keith   [facility] Specialty Technician

## 2021-02-24 ENCOUNTER — Telehealth
Admit: 2021-02-24 | Discharge: 2021-02-25 | Payer: MEDICAID | Attending: Student in an Organized Health Care Education/Training Program | Primary: Student in an Organized Health Care Education/Training Program

## 2021-02-24 MED ORDER — OXYCONTIN 15 MG TABLET,CRUSH RESISTANT,EXTENDED RELEASE
ORAL_TABLET | Freq: Two times a day (BID) | ORAL | 0 refills | 28 days | Status: CP
Start: 2021-02-24 — End: ?

## 2021-02-24 NOTE — Unmapped (Signed)
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     Assessment/Plan:   #Pain: Pain related to cancer treatment infusions is now gone since he is done with infusions.  However, does describe significant chronic pain that is worsened given his deconditioning.  Reviewed importance of nonopioid adjuvants including physical therapy.  Discussed plan to start titrating off of opioids in January/February of next year..  - Continue oxycontin 15mg  PO BID       - Refilled today 28 days x 2  - Continue dilaudid 2mg  PO q6hr PRN   - Continue gabapentin 300mg  PO TID    #Opioid-Induced Constipation: Patient denies.   - Continue senna qnight    #Fatigue: Energy level is improving overall.  - Continue to follow    #Anxiety: Mood overall has been good .  - Continue nortriptyline, PRN klonipin    #Advance Care Planning: Did not address this visit, but patient is looking forward to completing his treatment, despite the setback of his hospitalization.  - Continue to follow    #Controlled substances risk management:  ??? Patient does not have a signed pain medication agreement with our team.  ??? [place] database was reviewed today and it was appropriate.  ??? Urine drug screen was not performed at this visit. Findings: not applicable.  ??? Patient has received information about safe storage and administration of medications.  ??? Patient has not received a prescription for narcan; is not applicable.     F/u: 2 months    ----------------------------------------  Referring Provider: Dr. Berline Lopes  Oncology Team: Malignant hematology team  PCP: [name], MD    HPI: Adam Keith with a diagnosis of B-ALL, diagnosed in October 2021.  Recently had an 8-day hospitalization, discharged on 3/26.    Patient was admitted to [facility] 11/17/20-8/Adam/22 for MRSA PNA/bacteremia, c/b sepsis requiring intubation. Following discharge, he had increased pain requiring additional dilaudid doses, as well as increased fatigue and anxiety. He still has some pain with cough, but his symptoms have improved over the last few days, and he has been able to spend more time in his wood shop (though he is still limited by his PICC line). While he recovers, his wife has been helping with chores and errands, and he is generally trying to avoid crowded spaces until he feels better.    Due to his hospitalization, his cancer infusions were put on hold, but he is hoping to finish these by the end of the year.    Interval Events: Is now in remission and no longer on his infusion.  Very grateful about this.  Unfortunately has been having some worsening chronic pain. Does not feel like it is related to the chemo. Has been using some of the Dilaudid as needed again. Not using more than 1 a day.     [facility] and hoping to engage in more chronic pain treatment.  Reviewed importance of strength training and physical therapy to help address chronic pain.  Also discussed plan to hopefully titrate off of opioids in January/February of next year.    No other concerns today.  Looking forward to spending time with family over the holidays.    Symptom Review:  General: Feeling better these past few days  Pain: Improving back to baseline  Fatigue: Improving  Mobility: Improving  Sleep: Stable  Appetite: Stable  Nausea: Denies  Bowel function: Stable  Mood: Improving    Palliative Performance Scale: 80% - Ambulation: Full / Normal Activity with effort, some evidence of disease / Self-Care:Full / Intake: Normal or reduced / Level of Conscious: Full    Coping/Support Issues: Some difficulty coping with his current illness.  However, well supported by his wife.    Goals of Care: Focused on treatment and cancer directed therapy.    Social History:   Name of primary support: [name]  Occupation: Was in the [employer]	[place]  Hobbies: Training and development officer  Current residence / distance from [employer]: [facility]    Advance Care Planning: Did not discuss this visit.    Objective     Allergies:   Allergies   Allergen Reactions   ??? Bupropion Hcl Other (See Comments)     Per patient out of touch with reality, suicidal, homicidal   ??? Cefepime Rash     DRESS   ??? Ceftaroline Fosamil Rash and Other (See Comments)     Rash X 2 02/2020, suspected DRESS 06/2020   ??? 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 Onion Anaphylaxis   ??? Vancomycin Analogues      Hearing loss with Lasix  Other reaction(s): Other (See Comments)  Hearing loss  lasix   ??? Bismuth Subsalicylate Nausea And Vomiting   ??? Furosemide      With Vancomycin caused hearing loss  Other reaction(s): Other (See Comments)  With Vancomycin caused hearing loss       Family History:  Cancer-related family history is negative for Melanoma.  He indicated that the status of his neg hx is unknown.    REVIEW OF SYSTEMS:  A comprehensive review of 10 systems was negative except for pertinent positives noted in HPI.    PHYSICAL EXAM:   Virtual visit. Patient appeared comfortable, and breathing was normal.    Lab Results   Component Value Date    CREATININE 1.58 (H) 02/11/2021     Lab Results   Component Value Date    ALKPHOS 81 02/11/2021    BILITOT 0.3 02/11/2021    BILIDIR <0.10 11/19/2020    PROT 6.3 02/11/2021    ALBUMIN 4.1 02/11/2021    ALT 22 02/11/2021    AST 17 02/11/2021           The patient reports they are currently: at home. I spent 15 minutes on the real-time audio and video with the patient on the date of service. I spent an additional 10 minutes on pre- and post-visit activities on the date of service.     The patient was physically located in [place] or a state in which I am permitted to provide care. The patient and/or parent/guardian understood that s/he may incur co-pays and cost sharing, and agreed to the telemedicine visit. The visit was reasonable and appropriate under the circumstances given the patient's presentation at the time.    The patient and/or parent/guardian has been advised of the potential risks and limitations of this mode of treatment (including, but not limited to, the absence of in-person examination) and has agreed to be treated using telemedicine. The patient's/patient's family's questions regarding telemedicine have been answered.     If the visit was completed in an ambulatory setting, the patient and/or parent/guardian has also been advised to contact their provider???s office for worsening conditions, and seek emergency medical treatment and/or call 911 if the patient deems either necessary.  Marita Kansas MD

## 2021-02-25 IMAGING — MR MR LUMBAR SPINE W/O CM
4 of 5 series · 27 of 48 positions shown · non-contrast
Comparison: None.

CLINICAL DATA: Chronic low back pain with left lower extremity
numbness and weakness

EXAM:
MRI LUMBAR SPINE WITHOUT CONTRAST
TECHNIQUE: Multiplanar, multisequence MR imaging of the lumbar spine was
performed. No intravenous contrast was administered.

[Series 4: T1 · sagittal · 4.0mm · 0.55mm/px · 5 of 13 slices shown (1 of 2)]
[im 1/13]
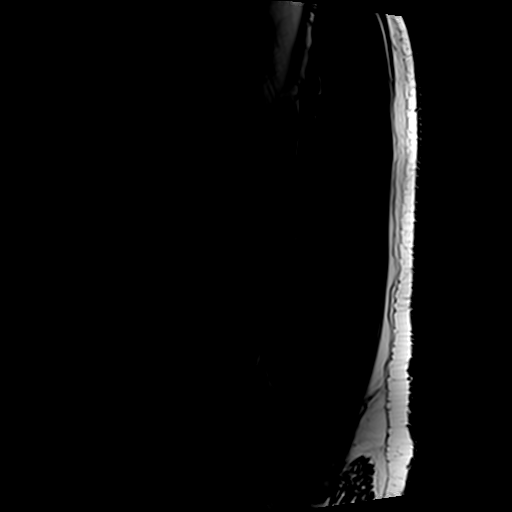
[im 4/13]
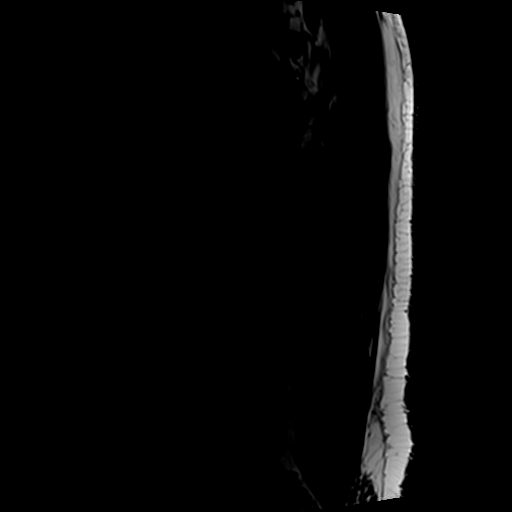
[im 7/13]
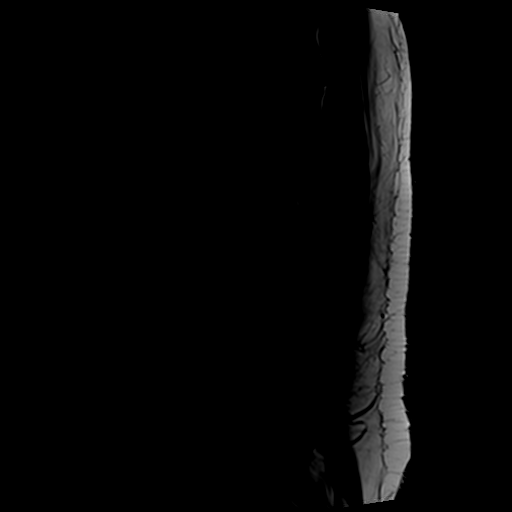
[im 10/13]
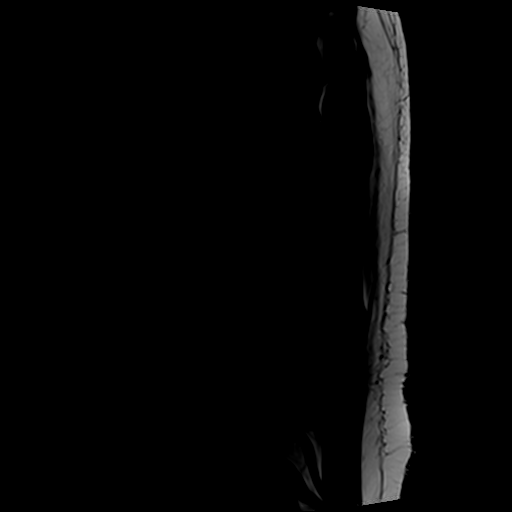
[im 13/13]
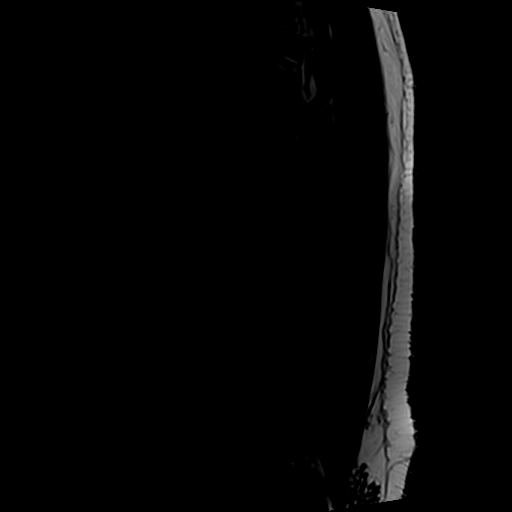

[Series 5: T2 · sagittal · 4.0mm · 0.55mm/px · 5 of 13 slices shown (1 of 2)]
[im 1/13]
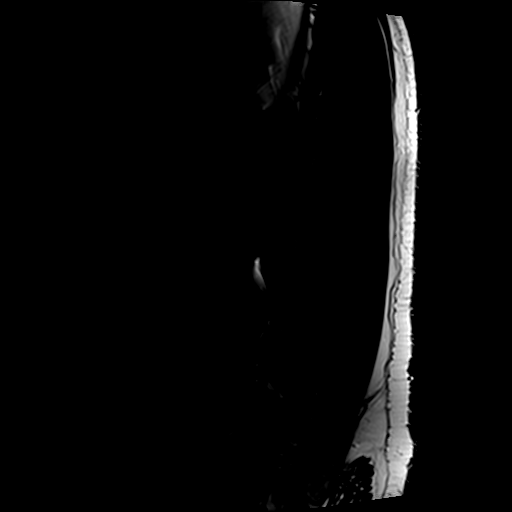
[im 4/13]
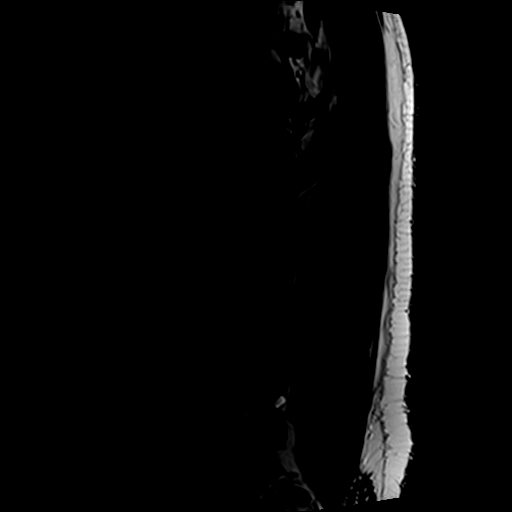
[im 7/13]
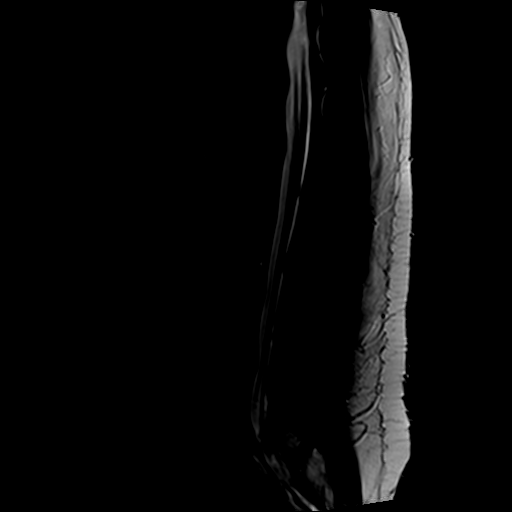
[im 10/13]
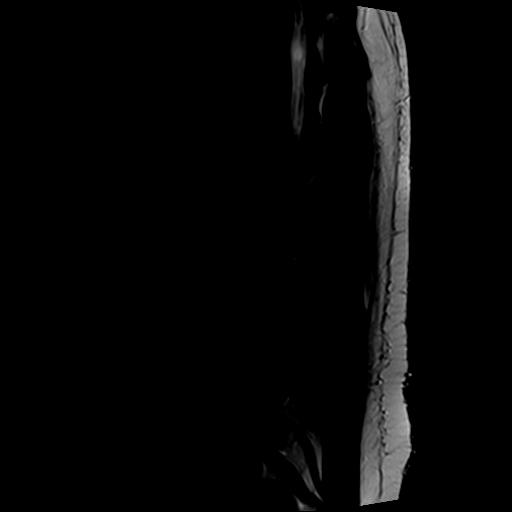
[im 13/13]
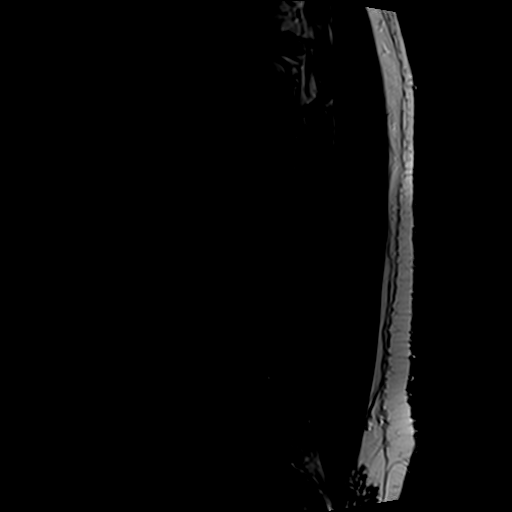

[Series 6: T2 · axial · 4.0mm · 0.70mm/px · z∈[-184,+33]mm · 11 of 44 slices shown (2 of 2)]
[im 3/44]
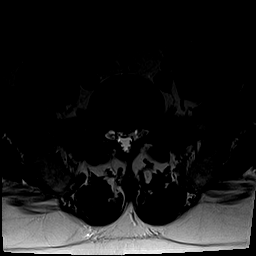
[im 6/44]
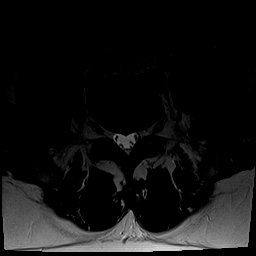
[im 9/44]
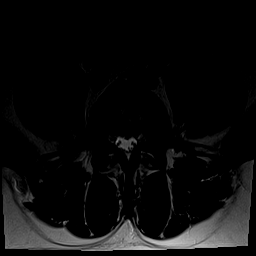
[im 14/44]
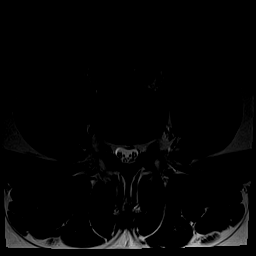
[im 19/44]
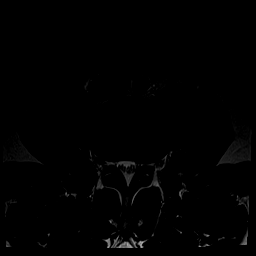
[im 22/44]
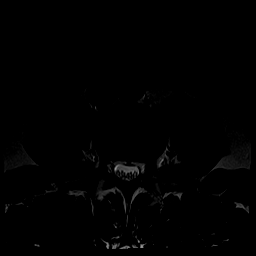
[im 25/44]
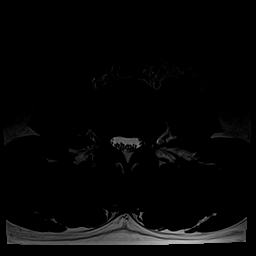
[im 30/44]
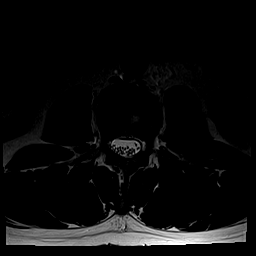
[im 35/44]
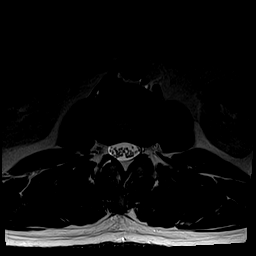
[im 38/44]
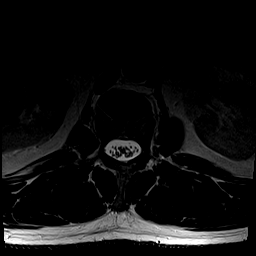
[im 41/44]
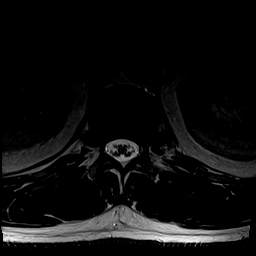

[Series 7: T1 · axial · 4.0mm · 0.35mm/px · z∈[-184,+17]mm · 6 of 44 slices shown (2 of 2)]
[im 3/44]
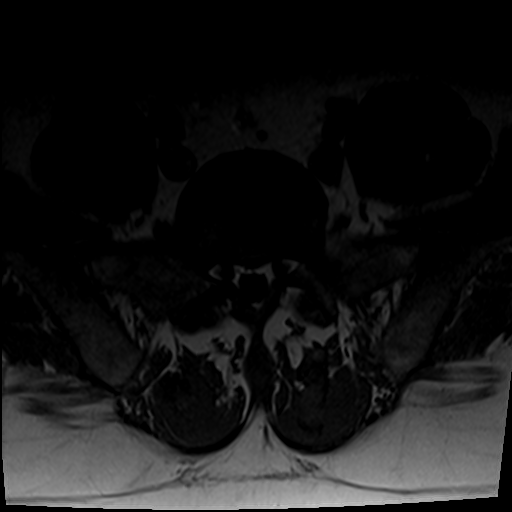
[im 6/44]
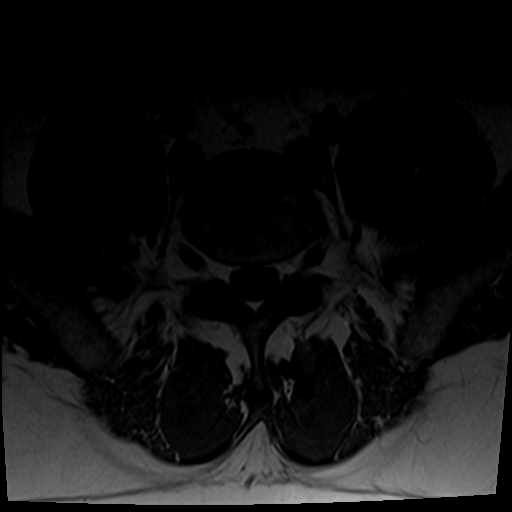
[im 9/44]
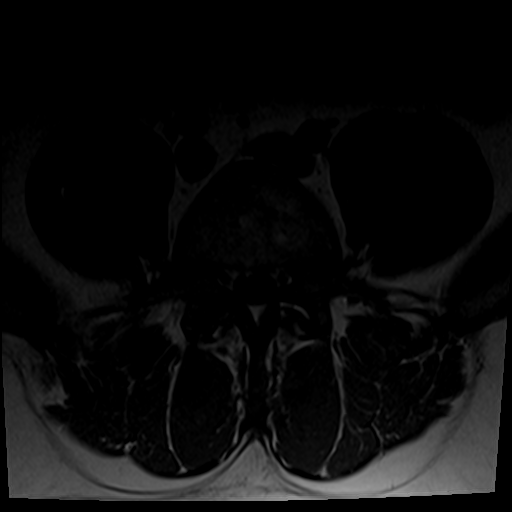
[im 14/44]
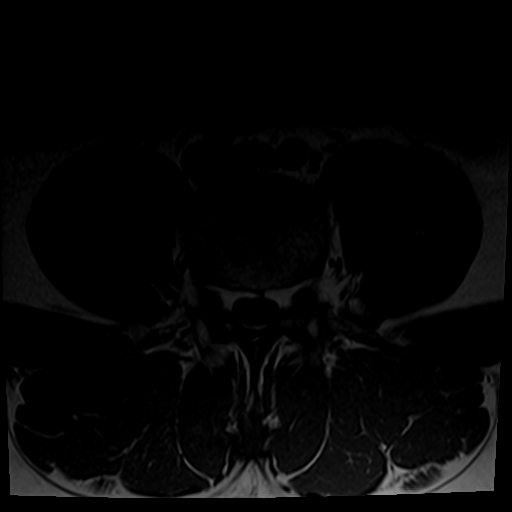
[im 22/44]
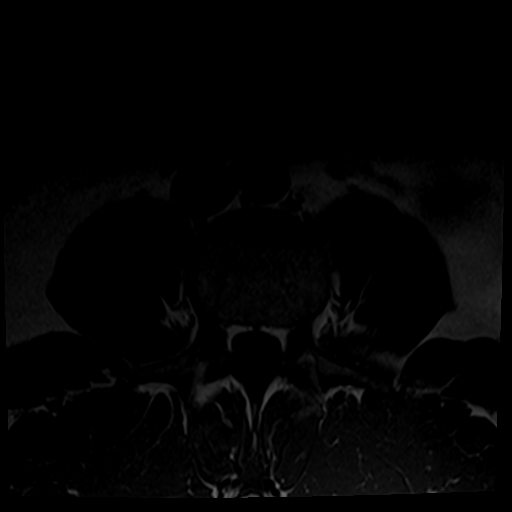
[im 38/44]
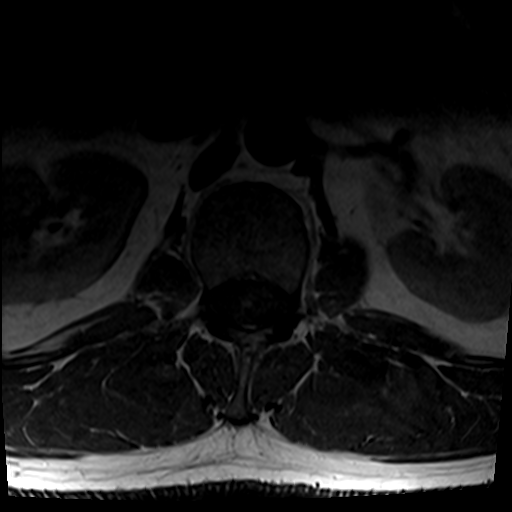

[27 of 48 positions shown; findings below may reference images not displayed]

FINDINGS: Segmentation:  Standard.

Alignment:  Physiologic.

Vertebrae:  No fracture, evidence of discitis, or bone lesion.

Conus medullaris and cauda equina: Conus extends to the L1 level.
Conus and cauda equina appear normal.

Paraspinal and other soft tissues: Negative.

Disc levels:

L1-L2: Normal disc space and facet joints. There is no spinal canal
stenosis. No neural foraminal stenosis.

L2-L3: Normal disc space and facet joints. There is no spinal canal
stenosis. No neural foraminal stenosis.

L3-L4: Mild disc bulge. There is no spinal canal stenosis. No neural
foraminal stenosis.

L4-L5: Intermediate disc bulge. Mild spinal canal stenosis. No
neural foraminal stenosis.

L5-S1: Normal disc space and facet joints. There is no spinal canal
stenosis. No neural foraminal stenosis.

Visualized sacrum: Normal.
IMPRESSION: 1. Mild L4-5 spinal canal stenosis.
2. No other spinal canal or neural foraminal stenosis.

## 2021-03-04 MED FILL — ICLUSIG 30 MG TABLET: ORAL | 30 days supply | Qty: 30 | Fill #3

## 2021-03-05 MED ORDER — OSELTAMIVIR 75 MG CAPSULE
ORAL_CAPSULE | Freq: Two times a day (BID) | ORAL | 0 refills | 5 days | Status: CP
Start: 2021-03-05 — End: 2021-03-10

## 2021-03-05 MED ORDER — LINEZOLID 600 MG TABLET
ORAL_TABLET | Freq: Two times a day (BID) | ORAL | 0 refills | 14 days | Status: CP
Start: 2021-03-05 — End: ?

## 2021-03-05 NOTE — Unmapped (Addendum)
IMMUNOCOMPROMISED HOST INFECTIOUS DISEASE PROGRESS NOTE    Assessment/Plan:     Adam [name] who presents for review of fevers and cough.     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 krusei fungemia  - Prior chemotherapy: [id number]-2005 induction with dasatinib (vincristine, dexamethasone and dasatinib); C1D1 01/24/2020; difficulty tolerating single-agent dasatinib  - Current chemotherapy: blinatumomab (anti-CD19/CD3) + ponatinib (TKI) s/p C4 on 12/14/20; 01/2021 now on ponatinib monotherapy maintenance  - 01/11/21: Normocellular bone marrow (30% overall) with trilineage hematopoiesis and less than 1% blasts by manual aspirate differential; Flow cytometry MRD analysis reveals no definitive immunophenotypic evidence of residual B lymphoblastic leukemia; BCR-ABL p210 transcripts were detected at a level of 0.002 IS % ratio in bone marrow.     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     Infection History  Active infections:   # Influenza infection with secondary bacterial pneumonia 03/04/21  - 02/28/21 Fever and respiratory symptoms   - 12/1 : RPP - Flu +ve  - 12/1 : CXR - Right Lower Lobe airspace disease  -12/1 : Tx : Linezolid /Oseltamivir---->    Prior infections:  #MRSA RLL PNA complicated by bacteremia 11/01/2020, probable right empyema 8/12/202; presumed relapsed RML MRSA pneumonia 11/18/2020  - 8/19 Failed thoracocentesis by IP  - 9/26 CT chest parenchyma without e/o infection, persistent but decreased loculated effusions on right  - s/p MRSA antibiotics until 01/04/2021  #Persistent right chest pain 12/29/20; Low grade fever, weakness 01/28/21  - 02/02/2021 seen by Dr. 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 Respiratory pathogen panel  ??? CBP w differential  ??? CMP Estimated Creatinine Clearance: 81 mL/min (A) (based on SCr of 1.58 mg/dL (H)).  ??? 2v Chest Xray   ??? He was unable to produce sputum for a culture today    Treatment  # Influenza infection with secondary bacterial pneumonia  ??? Start linezolid 600mg  bid 7-14 days pending clinical improvement  ??? Start oseltamivir 75mg  bid for 5 days  ??? Patient informed to seek medical attention if SOB worsens, fevers persistent  or not improving.    Monitoring for antimicrobial toxicities  Adam Keith is currently receiving drug therapy requiring intensive lab monitoring for toxicity.  ??? CBP and CMP weekly    Prophylaxis  Continue valacyclovir and TMP/SMX as needed.  Will need flu and COVID BV vaccines.    Follow up PRN. His wife has Dr. Audie Box phone number for questions or concerns.           Recommendations were communicated via shared medical record.    Attending attestation  I saw and evaluated the patient, participating in the key portions of the service.  I reviewed the resident???s note.  I agree with the resident???s findings and plan.   His wife offered the ED to him Thursday night and he declined, but he did agree to see me in clinic today. He left prior to having lab work performed today (he was unwilling to wait while lab ordering errors were corrected). We confirmed that both resting and ambulatory saturations were adequate today.   [name], MD      Subjective     Interval History:      Adam Keith reports sore throat, runny nose and dry productive cough for last 4-5 days associated with generalized myalgia's and malaise.     He does not report shortness of breath on exertion, localized pleuritic chest pain, palpitations or orthopnea. He also denies nausea, vomiting, abdominal pain, dysuria, arthralgia's or headaches.    He reports his wife also having similar symptoms recently.    In the clinic today his O2 saturation is >95 % both at rest and after ambulating.      Medications:  Antimicrobials:  Valacyclovir  Bactrim    Prior/Current immunomodulators:  Ponatinib    Other medications reviewed.    Objective     Vital signs:  BP 94/55  - Pulse (S) 106  - Temp 36.8 ??C (98.3 ??F) (Oral)  - Resp 18  - Ht 188 cm (6' 2.02)  - Wt (!) 109.5 kg (241 lb 4.8 oz)  - SpO2 96%  - BMI 30.97 kg/m??     Physical Exam:  Const [x]  vital signs above    [x]  NAD, non-toxic appearance []  Chronically ill-appearing, non-distressed        Eyes [x]  Lids normal bilaterally, conjunctiva anicteric and noninjected OU     [] PERRL  [] EOMI        ENMT [x]  Normal appearance of external nose and ears, no nasal discharge        [x]  MMM, no lesions on lips or gums [x]  No thrush, leukoplakia, oral lesions  []  Dentition good []  Edentulous []  Dental caries present  []  Hearing normal  []  TMs with good light reflexes bilaterally         Neck [x]  Neck of normal appearance and trachea midline        []  No thyromegaly, nodules, or tenderness   []  Full neck ROM        Lymph [x]  No LAD in neck     [x]  No LAD in supraclavicular area     []  No LAD in axillae   []  No LAD in epitrochlear chains     []  No LAD in inguinal areas        CV [x]  RRR            [x]  No peripheral edema     []  Pedal pulses intact   [x]  No abnormal heart sounds appreciated   []   Extremities WWP         Resp [x]  Normal WOB at rest    [x]  No breathlessness with speaking, no coughing  [x]  CTA anteriorly    [x]  CTA posteriorly          GI [x]  Normal inspection, NTND   [x]  NABS     []  No umbilical hernia on exam       [x]  No hepatosplenomegaly     []  Inspection of perineal and perianal areas normal        GU []  Normal external genitalia     [] No urinary catheter present in urethra   []  No CVA tenderness    []  No tenderness over renal allograft  deferred      MSK [x]  No clubbing or cyanosis of hands       []  No vertebral point tenderness  [x]  No focal tenderness or abnormalities on palpation of joints in RUE, LUE, RLE, or LLE        Skin [x]  No rashes, lesions, or ulcers of visualized skin     [x]  Skin warm and dry to palpation         Neuro [x]  Face expression symmetric  []  Sensation to light touch grossly intact throughout    [x]  Moves extremities equally    [x]  No tremor noted        []  CNs II-XII grossly intact     []  DTRs normal and symmetric throughout [x]  Gait unremarkable        Psych [x]  Appropriate affect       [x]  Fluent speech         [x]  Attentive, good eye contact  [x]  Oriented to person, place, time          []  Judgment and insight are appropriate             Labs:  Results in Past 30 Days  Result Component Current Result Ref Range Previous Result Ref Range   Absolute Eosinophils 0.7 (H) (02/11/2021) 0.0 - 0.5 10*9/L Not in Time Range    Absolute Lymphocytes 2.1 (02/11/2021) 1.1 - 3.6 10*9/L Not in Time Range    Absolute Neutrophils 10.5 (H) (02/11/2021) 1.8 - 7.8 10*9/L Not in Time Range    Alkaline Phosphatase 81 (02/11/2021) 46 - 116 U/L Not in Time Range    ALT 22 (02/11/2021) 10 - 49 U/L Not in Time Range    AST 17 (02/11/2021) <=34 U/L Not in Time Range    BUN 13 (02/11/2021) 9 - 23 mg/dL Not in Time Range    Calcium 9.6 (02/11/2021) 8.7 - 10.4 mg/dL Not in Time Range    Creatinine 1.58 (H) (02/11/2021) 0.60 - 1.10 mg/dL Not in Time Range    HGB 14.3 (02/11/2021) 12.9 - 16.5 g/dL Not in Time Range    Platelet 238 (02/11/2021) 150 - 450 10*9/L Not in Time Range    Potassium 3.9 (02/11/2021) 3.4 - 4.8 mmol/L Not in Time Range    Total Bilirubin 0.3 (02/11/2021) 0.3 - 1.2 mg/dL Not in Time Range    WBC 14.2 (H) (02/11/2021) 3.6 - 11.2 10*9/L Not in Time Range        Microbiology:  Past cultures were reviewed in Epic and CareEverywhere.  12/1 : Influenza A +ve      Imaging:  Chest X Ray 12/1  New superior segment right lower lobe airspace disease suspicious for pneumonia.   Consider follow-up chest radiography in 4-6 weeks after treatment to document  resolution.    Independent visualization of images: I independently reviewed the image from (date) and I agree with the findings/interpretation.

## 2021-03-05 NOTE — Unmapped (Unsigned)
[facility]  Psychiatry   Established Patient E&M Service - Outpatient       Assessment:    Adam Keith presents for follow-up evaluation. ***    He states that his anxiety and depression is only a problem at night and is especially challenging on nights when his wife is at work. He states that his nortriptyline 100 mg is managing his depressive and anxiety symptoms well during the day. Encouraged him to reconnect with his therapist given his challenges in the evenings. He is resistant to a dose adjustment of his nortriptyline. He continues to use clonazepam at bedtime, but recognizes that he is taking it too late in the evening, which causes him to sleep until noon each day. Will reassess in 4 weeks.    Identifying Information:  Adam [name] with a history of PH+ B cell ALL, major depressive disorder, PTSD, anxiety, and insomnia.??He has a long struggle with intermittent depressive episodes with one suicide attempt at age 30.??    Risk Assessment:  An assessment of suicide and violence risk factors was performed as part of this evaluation and is not significantly changed from the last visit.   While future psychiatric events cannot be accurately predicted, the patient does not currently require acute inpatient psychiatric care and does not currently meet [facility] involuntary commitment criteria.      Plan:    Problem 1:??Major Depressive Disorder  Status of problem:??improving  Interventions:??  ??? Continue??nortriptyline??100??mg??at bedtime. He started this dose on 12/08/20 after completing his linezolid. He still has depression/anxiety in the evenings, but he does not want to make a dose change at this time.  ??? Monitor nortriptyline level given elevated creatinine levels.??Nortriptyline level drawn on 12/21/20 was 66 (Ref range 70-170).????  ??? Encouraged patient to reconnect with his local therapist. He struggles with anxiety and depressed mood in the evenings, which is worse when his wife is at work. Patient agreed that he should pursue therapy to work on his issues surrounding his difficulty being alone.     Problem 2:??Anxiety  Status of problem:??chronic??with moderate exacerbation only in the evenings  Interventions:??  ??? See nortriptyline plan above  ??? OK to continue clonazepam??0.5??mg tablets??BID PRN. Patient states that he has only been using this medication at bedtime.??Last filled on 12/12/20 per PDMP review.  ??? We explored non-pharmacologic methods of managing anxiety. Patient believes that distraction may be helpful, especially when his wife is at work. He states that he is going to try playing video games on evenings when his wife is at work.    Problem 3:??Insomnia  Status of problem:?? improved  Interventions:??  ??? See nortriptyline and clonazepam plans above  ??? Education??re: sleep hygiene      Psychotherapy provided:  No billable psychotherapy service provided.    Patient has been given this writer's contact information as well as the [facility] Psychiatry urgent line number. The patient has been instructed to call 911 for emergencies.      Subjective:    Chief complaint:  Follow-up psychiatric evaluation for depression and anxiety    Interval History: ***    He states that his depression and anxiety are now only a problem at night and especially problematic when his wife goes to work. She works as an Museum/gallery exhibitions officer and works 24 hour shifts every 4 days. He is resistant to making any medication changes because he is really doing well during the day and is still hopeful that  his nights will improve. He has been sleeping well at night (8-10 hours per night) and is concerned that he goes to bed too late due to elevated anxiety at bedtime, which leads him to sleep until noon the next day. He was willing to strategize with me on how to better manage his anxiety at night. He agreed to try taking his clonazepam earlier in the evenings (he has been waiting until midnight) to see if that will improve his anxiety in the evenings and help him relax before bed. He is pleased that his fatigue during the day is also improving. He does report that he did experience some passive suicidal thoughts when his wife was at work last Monday, but denies further dark thoughts.    Prior Psychiatric Medication Trials: bupropion (could not tolerate), duloxetine, mirtazapine (given during recent hospitalization), lorazepam    Psychiatric Review of Systems  ??? Depressive Symptoms:??depressed mood (only at night)  ??? Manic Symptoms:??N/A  ??? Psychosis Symptoms:??N/A  ??? Anxiety Symptoms:??excessive anxiety and worry, difficulty controlling worry  ??? Panic Symptoms:??denies  ??? PTSD Symptoms:??no recent issues  ??? Sleep disturbance:??denies   ??? Appetite:??good  ??? Exercise:??Unable to engage in an exercise routine.    Psychiatric Review of Systems  ??? Depressive Symptoms: {Symptoms; depression:1002}  ??? Manic Symptoms: {jlm mania symptoms:57189}  ??? Psychosis Symptoms: {jlm psychosis symptoms:57190}  ??? Anxiety Symptoms: {AGH Anxiety Symptoms:35548}   ??? Panic Symptoms: {AGH Panic Symptoms:35842}  ??? PTSD Symptoms: {TL PTSD Symptoms:51663}  ??? Sleep disturbance: {Insomnia [phone]}   ??? Appetite: {JB3 NPIQ2 Appetite/Eating:35637}  ??? Exercise: {Daily exercise routine:51451}       Objective:      Mental Status Exam:  Appearance:    {PSY Appearance:23008}   Motor:   {PSY Motor:23010}   Speech/Language:    {PSY Speech/Lang:23011}   Mood:   {PSY Mood:23012}   Affect:   {PSY Affect:23013}   Thought process and Associations:   {PSY Thought Process revised:53234}   Abnormal/psychotic thought content:     {PSY Thought Content:23016}   Perceptual disturbances:     {PSY Perceptual Disturbances:23017}     Other:   insight and judgment intact     I personally spent *** minutes face-to-face and non-face-to-face in the care of this patient, which includes all pre, intra, and post visit time on the date of service.    Visit was completed by video (or phone) and the appropriate disclaimer has been included below.    The patient reports they are currently: {patient location:81390}. I spent *** minutes on the {phone audio video visit:67489} with the patient on the date of service. I spent an additional *** minutes on pre- and post-visit activities on the date of service.     The patient was physically located in [place] or a state in which I am permitted to provide care. The patient and/or parent/guardian understood that s/he may incur co-pays and cost sharing, and agreed to the telemedicine visit. The visit was reasonable and appropriate under the circumstances given the patient's presentation at the time.    The patient and/or parent/guardian has been advised of the potential risks and limitations of this mode of treatment (including, but not limited to, the absence of in-person examination) and has agreed to be treated using telemedicine. The patient's/patient's family's questions regarding telemedicine have been answered.     If the visit was completed in an ambulatory setting, the patient and/or parent/guardian has also been advised to contact their provider???s office for worsening conditions,  and seek emergency medical treatment and/or call 911 if the patient deems either necessary.      Sheran Lawless, PMHNP  03/04/2021

## 2021-03-06 ENCOUNTER — Ambulatory Visit: Admit: 2021-03-06 | Payer: MEDICAID

## 2021-03-06 ENCOUNTER — Ambulatory Visit
Admit: 2021-03-06 | Discharge: 2021-03-20 | Disposition: A | Discharge status: Home / Self Care | Payer: MEDICAID | Admitting: Critical Care Medicine

## 2021-03-06 ENCOUNTER — Ambulatory Visit: Admit: 2021-03-06 | Discharge: 2021-03-20 | Payer: MEDICAID

## 2021-03-06 ENCOUNTER — Encounter: Admit: 2021-03-06 | Payer: MEDICAID

## 2021-03-06 ENCOUNTER — Ambulatory Visit: Admit: 2021-03-06 | Payer: MEDICAID | Attending: Infectious Disease | Primary: Infectious Disease

## 2021-03-06 ENCOUNTER — Ambulatory Visit: Admit: 2021-03-05 | Discharge: 2021-03-06 | Payer: MEDICAID

## 2021-03-06 LAB — BLOOD GAS, VENOUS
BASE EXCESS VENOUS: -6.5 — ABNORMAL LOW (ref -2.0–2.0)
BASE EXCESS VENOUS: -8 — ABNORMAL LOW (ref -2.0–2.0)
BASE EXCESS VENOUS: -4.5 — ABNORMAL LOW (ref -2.0–2.0)
HCO3 VENOUS: 19 mmol/L — ABNORMAL LOW (ref 22–27)
HCO3 VENOUS: 17 mmol/L — ABNORMAL LOW (ref 22–27)
HCO3 VENOUS: 20 mmol/L — ABNORMAL LOW (ref 22–27)
O2 SATURATION VENOUS: 26.7 % — ABNORMAL LOW (ref 40.0–85.0)
O2 SATURATION VENOUS: 55.5 % (ref 40.0–85.0)
O2 SATURATION VENOUS: 77 % (ref 40.0–85.0)
PCO2 VENOUS: 41 mmHg (ref 40–60)
PCO2 VENOUS: 35 mmHg — ABNORMAL LOW (ref 40–60)
PCO2 VENOUS: 36 mmHg — ABNORMAL LOW (ref 40–60)
PH VENOUS: 7.29 — ABNORMAL LOW (ref 7.32–7.43)
PH VENOUS: 7.31 — ABNORMAL LOW (ref 7.32–7.43)
PH VENOUS: 7.36 (ref 7.32–7.43)
PO2 VENOUS: 20 mmHg — ABNORMAL LOW (ref 30–55)
PO2 VENOUS: 34 mmHg (ref 30–55)
PO2 VENOUS: 41 mmHg (ref 30–55)

## 2021-03-06 LAB — CBC W/ AUTO DIFF
BASOPHILS ABSOLUTE COUNT: 0.1 10*9/L (ref 0.0–0.1)
BASOPHILS RELATIVE PERCENT: 0.3 %
EOSINOPHILS ABSOLUTE COUNT: 0 10*9/L (ref 0.0–0.5)
EOSINOPHILS RELATIVE PERCENT: 0 %
HEMATOCRIT: 39.4 % (ref 39.0–48.0)
HEMOGLOBIN: 13.3 g/dL (ref 12.9–16.5)
LYMPHOCYTES ABSOLUTE COUNT: 0.8 10*9/L — ABNORMAL LOW (ref 1.1–3.6)
LYMPHOCYTES RELATIVE PERCENT: 3.6 %
MEAN CORPUSCULAR HEMOGLOBIN CONC: 33.8 g/dL (ref 32.0–36.0)
MEAN CORPUSCULAR HEMOGLOBIN: 29 pg (ref 25.9–32.4)
MEAN CORPUSCULAR VOLUME: 85.8 fL (ref 77.6–95.7)
MEAN PLATELET VOLUME: 8.3 fL (ref 6.8–10.7)
MONOCYTES ABSOLUTE COUNT: 1.1 10*9/L — ABNORMAL HIGH (ref 0.3–0.8)
MONOCYTES RELATIVE PERCENT: 5.2 %
NEUTROPHILS ABSOLUTE COUNT: 18.7 10*9/L — ABNORMAL HIGH (ref 1.8–7.8)
NEUTROPHILS RELATIVE PERCENT: 90.9 %
PLATELET COUNT: 179 10*9/L (ref 150–450)
RED BLOOD CELL COUNT: 4.59 10*12/L (ref 4.26–5.60)
RED CELL DISTRIBUTION WIDTH: 15.9 % — ABNORMAL HIGH (ref 12.2–15.2)
WBC ADJUSTED: 20.6 10*9/L — ABNORMAL HIGH (ref 3.6–11.2)

## 2021-03-06 LAB — COMPREHENSIVE METABOLIC PANEL
ALBUMIN: 3 g/dL — ABNORMAL LOW (ref 3.4–5.0)
ALKALINE PHOSPHATASE: 62 U/L (ref 46–116)
ALT (SGPT): 14 U/L (ref 10–49)
ANION GAP: 13 mmol/L (ref 5–14)
AST (SGOT): 13 U/L (ref <=34)
BILIRUBIN TOTAL: 0.7 mg/dL (ref 0.3–1.2)
BLOOD UREA NITROGEN: 21 mg/dL (ref 9–23)
BUN / CREAT RATIO: 7
CALCIUM: 7.9 mg/dL — ABNORMAL LOW (ref 8.7–10.4)
CHLORIDE: 106 mmol/L (ref 98–107)
CO2: 17 mmol/L — ABNORMAL LOW (ref 20.0–31.0)
CREATININE: 3.09 mg/dL — ABNORMAL HIGH
EGFR CKD-EPI (2021) MALE: 25 mL/min/{1.73_m2} — ABNORMAL LOW (ref >=60)
GLUCOSE RANDOM: 111 mg/dL (ref 70–179)
POTASSIUM: 4.3 mmol/L (ref 3.4–4.8)
PROTEIN TOTAL: 5.8 g/dL (ref 5.7–8.2)
SODIUM: 136 mmol/L (ref 135–145)

## 2021-03-06 LAB — SLIDE REVIEW

## 2021-03-06 LAB — URINALYSIS WITH CULTURE REFLEX
BILIRUBIN UA: NEGATIVE
GLUCOSE UA: NEGATIVE
GRANULAR CASTS: 6 /LPF — ABNORMAL HIGH (ref <=0)
HYALINE CASTS: 7 /LPF — ABNORMAL HIGH (ref 0–1)
NITRITE UA: NEGATIVE
PH UA: 5.5 (ref 5.0–9.0)
PROTEIN UA: 100 — AB
RBC UA: 1 /HPF (ref <=3)
SPECIFIC GRAVITY UA: 1.022 (ref 1.003–1.030)
SQUAMOUS EPITHELIAL: <1 /HPF (ref 0–5)
TRANSITIONAL EPITHELIAL: <1 /HPF (ref 0–2)
UROBILINOGEN UA: 2 — AB
WBC UA: 17 /HPF — ABNORMAL HIGH (ref <=2)

## 2021-03-06 LAB — LIPASE: LIPASE: 22 U/L (ref 12–53)

## 2021-03-06 LAB — IGG: GAMMAGLOBULIN; IGG: 148 mg/dL — ABNORMAL LOW (ref 646–2013)

## 2021-03-06 LAB — BASIC METABOLIC PANEL
ANION GAP: 10 mmol/L (ref 5–14)
BLOOD UREA NITROGEN: 24 mg/dL — ABNORMAL HIGH (ref 9–23)
BUN / CREAT RATIO: 10
CALCIUM: 6.9 mg/dL — ABNORMAL LOW (ref 8.7–10.4)
CHLORIDE: 110 mmol/L — ABNORMAL HIGH (ref 98–107)
CO2: 17 mmol/L — ABNORMAL LOW (ref 20.0–31.0)
CREATININE: 2.52 mg/dL — ABNORMAL HIGH
EGFR CKD-EPI (2021) MALE: 32 mL/min/{1.73_m2} — ABNORMAL LOW (ref >=60)
GLUCOSE RANDOM: 99 mg/dL (ref 70–179)
POTASSIUM: 3.8 mmol/L (ref 3.4–4.8)
SODIUM: 137 mmol/L (ref 135–145)

## 2021-03-06 LAB — MAGNESIUM
MAGNESIUM: 1.2 mg/dL — ABNORMAL LOW (ref 1.6–2.6)
MAGNESIUM: 2 mg/dL (ref 1.6–2.6)

## 2021-03-06 LAB — LACTATE SEPSIS, VENOUS 2: LACTATE BLOOD VENOUS: 2.5 mmol/L (ref 0.5–1.8)

## 2021-03-06 LAB — HIGH SENSITIVITY TROPONIN I - SINGLE: HIGH SENSITIVITY TROPONIN I: 24 ng/L (ref <=53)

## 2021-03-06 LAB — LACTATE, VENOUS, WHOLE BLOOD
LACTATE BLOOD VENOUS: 2.5 mmol/L — ABNORMAL HIGH (ref 0.5–1.8)
LACTATE BLOOD VENOUS: 1.5 mmol/L (ref 0.5–1.8)

## 2021-03-06 LAB — LACTATE SEPSIS, VENOUS: LACTATE BLOOD VENOUS: 4.1 mmol/L (ref 0.5–1.8)

## 2021-03-06 LAB — LACTATE SEPSIS, VENOUS 3: LACTATE BLOOD VENOUS: 2.5 mmol/L (ref 0.5–1.8)

## 2021-03-06 MED ADMIN — metroNIDAZOLE (FLAGYL) IVPB 500 mg: 500 mg | INTRAVENOUS | @ 15:00:00 | Stop: 2021-03-06

## 2021-03-06 MED ADMIN — MORPhine 4 mg/mL injection 4 mg: 4 mg | INTRAVENOUS | @ 15:00:00 | Stop: 2021-03-06

## 2021-03-06 MED ADMIN — gabapentin (NEURONTIN) capsule 100 mg: 100 mg | ORAL

## 2021-03-06 MED ADMIN — magnesium sulfate in water 2 gram/50 mL (4 %) IVPB 2 g: 2 g | INTRAVENOUS | @ 17:00:00 | Stop: 2021-03-06

## 2021-03-06 MED ADMIN — oseltamivir (TAMIFLU) capsule 75 mg: 75 mg | ORAL | Stop: 2021-03-06

## 2021-03-06 MED ADMIN — oxyCODONE (ROXICODONE) immediate release tablet 5 mg: 5 mg | ORAL | @ 22:00:00 | Stop: 2021-03-09

## 2021-03-06 MED ADMIN — lactated ringers bolus 500 mL: 500 mL | INTRAVENOUS | @ 15:00:00 | Stop: 2021-03-06

## 2021-03-06 MED ADMIN — acetaminophen (TYLENOL) tablet 650 mg: 650 mg | ORAL | @ 22:00:00

## 2021-03-06 MED ADMIN — lactated ringers bolus 500 mL: 500 mL | INTRAVENOUS | Stop: 2021-03-06

## 2021-03-06 MED ADMIN — linezolid in dextrose 5% (ZYVOX) 600 mg/300 mL IVPB 600 mg: 600 mg | INTRAVENOUS | @ 16:00:00 | Stop: 2021-03-06

## 2021-03-06 MED ADMIN — lactated ringers bolus 1,000 mL: 1000 mL | INTRAVENOUS | @ 17:00:00 | Stop: 2021-03-06

## 2021-03-06 MED ADMIN — ondansetron (ZOFRAN) injection 4 mg: 4 mg | INTRAVENOUS | @ 15:00:00 | Stop: 2021-03-06

## 2021-03-06 MED ADMIN — valACYclovir (VALTREX) tablet 500 mg: 500 mg | ORAL

## 2021-03-06 MED ADMIN — aztreonam (AZACTAM) 2 g in sodium chloride 0.9 % (NS) 100 mL IVPB-connector bag: 2 g | INTRAVENOUS | @ 15:00:00 | Stop: 2021-03-06

## 2021-03-06 NOTE — Unmapped (Signed)
NEW IMMUNOCOMPROMISED HOST INFECTIOUS DISEASE CONSULT NOTE      Adam Keith is being seen in consultation at the request of [name], MD for evaluation of pneumonia.      Assessment/Recommendations:    Adam 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 krusei fungemia  - Prior chemotherapy:??GRAAPPH-2005??induction??with dasatinib??(vincristine, dexamethasone and dasatinib);??C1D1 01/24/2020; difficulty tolerating single-agent dasatinib  - Current chemotherapy:??blinatumomab (anti-CD19/CD3) + ponatinib (TKI)??s/p??C4 on??12/14/20; 01/2021 now on ponatinib monotherapy maintenance  -??01/11/21:??Normocellular bone marrow (30% overall) with trilineage hematopoiesis and less than 1% blasts by manual aspirate differential;??Flow cytometry MRD analysis reveals no definitive immunophenotypic evidence of residual B lymphoblastic leukemia;??BCR-ABL p210 transcripts were detected at a level of 0.002 IS % ratio in bone marrow.    # Hypogammaglobulimia  - 11/01/20 IgG 266 declined IVIG  ??  Pertinent Co-morbidities  # COPD/empysema  #??DIHS/DRESS??ceftaroline??06/12/2020, relapse??after cefepime??11/02/2020  - per prior ID notes possible culprits ceftaroline,??posaconazole, dasatinib, sotrovimab  -??12/14/20??discontinued??steroids  ????  Pertinent Exposure History??  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  Infection History  Active infections:??  # Influenza infection with secondary bacterial pneumonia 03/05/21  - 02/28/21 Fever and respiratory symptoms   - 12/2 : RPP - Flu +ve  - 12/2 : CXR - Right Lower Lobe airspace disease  - 12/3: CXR worsened opacities  - 12/2 : Tx : Linezolid /Oseltamivir---> Linezolid/aztreonam/flagyl/oseltamivir 12/3 -->  - c/b AKI    Prior infections:  #MRSA??RLL PNA complicated by??bacteremia 11/01/2020, probable right empyema 8/12/202; presumed relapsed RML MRSA pneumonia 11/18/2020  - 8/19 Failed??thoracocentesis by IP  - 9/26 CT chest parenchyma without e/o infection, persistent but decreased loculated effusions on right  - s/p MRSA antibiotics until 01/04/2021  #Persistent right chest pain 12/29/20; Low grade fever, weakness 01/28/21  - 02/02/2021 seen by Dr. 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  Antimicrobial Intolerance/allergy  Cefepime - DRESS  Ceftaroline -??DRESS  Dapsone - possible??agranulocytosis, per chart anaphylaxis  Vancomycin - probable ototoxicity (in combination??with furosemide)  Isavuconazole - elevated LFTs in the setting of TKI     RECOMMENDATIONS    Diagnostic  ??? Please send sputum for cx when able, for aerobic/anaerobic cx  ??? Follow-up BCx 12/3  ??? Please check IgG levels    Monitoring for antimicrobial toxicities  ??? While on antibiotics, monitor CBC with diff and CMP at least weekly    Treatment  ??? CONTINUE linezolid 500 mg bid  ??? CONTINUE aztreonam for empiric gram-negative coverage  ??? CONTINUE oseltamivir 75 mg bid x 5 days (12/1 - 12/5)    Prophylaxis  ??? Cont valacyclovir, bactrim  The [facility] ID service will continue to follow.  Please page the ID Transplant/Liquid Oncology Fellow consult at [phone] with questions.      History of Present Illness:      Source of information includes:  Electronic Medical Records.  History obtained from:EMR .    58M with hx ALL, COPD, prior MRSA pneumonia c/b bacteremia 10/2020, recent influenza A infection 03/05/21, now presenting with worsening shortness of breath, cough, fevers.    Pt recently seen in ID clinic 03/04/21 reporting 4-5 days of runny nose, sore throat, dry productive cough, myalgias, and malaise. He denied shortness of breath and had O2 saturation >95% at rest and with ambulation in clinic. RPP was positive for FluA. CXR 12/2 with possible RLL opacity c/f developing pneumonia. Overall picture was concerning for Flu with secondary bacterial infection, particularly with history of MRSA pneumonia. Started on oseltamivir and linezolid 12/1, given ED precautions.    After returning home, noted worsening shortness of breath and cough and presented morning of 12/3 to ED. Found to be hypotensive with lactate 4.1, improving after fluids. WBC 20.6 and new AKI (SCr 3.09), overall c/f sepsis. UA with 17 wbcs; UCx pending. BCxs x2 on 12/2.Started on linezolid/aztreonam/metronidazole given abx allergies/intolerance. CXR this am with marked worsening Right sided infiltrates.    Allergies:  Allergies   Allergen Reactions   ??? Bupropion Hcl Other (See Comments)     Per patient out of touch with reality, suicidal, homicidal   ??? Cefepime Rash     DRESS   ??? Ceftaroline Fosamil Rash and Other (See Comments)     Rash X 2 02/2020, suspected DRESS 06/2020   ??? 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 Onion Anaphylaxis   ??? Vancomycin Analogues      Hearing loss with Lasix  Other reaction(s): Other (See Comments)  Hearing loss  lasix ??? Bismuth Subsalicylate Nausea And Vomiting   ??? Furosemide      With Vancomycin caused hearing loss  Other reaction(s): Other (See Comments)  With Vancomycin caused hearing loss       Medications:   Antimicrobials:  Anti-infectives (From admission, onward)    Start     Dose/Rate Route Frequency Ordered Stop    03/07/21 0900  oseltamivir (TAMIFLU) capsule 30 mg         30 mg Oral 2 times a day (standard) 03/06/21 1311      03/06/21 2100  linezolid in dextrose 5% (ZYVOX) 600 mg/300 mL IVPB 600 mg         600 mg  300 mL/hr over 60 Minutes Intravenous Every 12 hours scheduled 03/06/21 1226 03/13/21 2059    03/06/21 2100  sulfamethoxazole-trimethoprim (BACTRIM DS) 800-160 mg tablet 160 mg of trimethoprim         1 tablet Oral 2 times per day on Sun Sat 03/06/21 1501 03/28/21 0859    03/06/21 1503  DAPTOmycin (CUBICIN) 450 mg in sodium chloride (NS) 0.9 % 50 mL IVPB         4 mg/kg ?? 109.5 kg  132 mL/hr over 30 Minutes Intravenous Every 24 hours 03/06/21 1502 03/07/21 1458    03/06/21 1312  oseltamivir (TAMIFLU) capsule 75 mg         75 mg Oral Once 03/06/21 1311      03/06/21 1227  aztreonam (AZACTAM) 2 g in sodium chloride 0.9 % (NS) 100 mL IVPB-connector bag  2 g  300 mL/hr over 20 Minutes Intravenous Every 12 hours 03/06/21 1226 03/13/21 1226    03/06/21 1227  azithromycin (ZITHROMAX) 500 mg in sodium chloride (NS) 0.9 % 250 mL IVPB         500 mg  280 mL/hr over 60 Minutes Intravenous Every 24 hours 03/06/21 1226 03/09/21 1226    03/06/21 1218  valACYclovir (VALTREX) tablet 500 mg         500 mg Oral Daily (standard) 03/06/21 1217            Current/Prior immunomodulators:  ponatinib 30mg  monotherapy (held)    Other medications reviewed.     Medical History:  Past Medical History:   Diagnosis Date   ??? 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 BONE MARROW BIOPSY & ASPIRATION  01/21/2020        ??? CHG [place], CHEST,REAL TIME 11/20/2020    Procedure: ULTRASOUND, CHEST, REAL TIME WITH IMAGE DOCUMENTATION;  Surgeon: Jerelyn Charles, MD;  Location: [facility];  Service: Pulmonary   ??? IR INSERT PORT AGE GREATER THAN 5 YRS  03/10/2020    IR INSERT PORT AGE GREATER THAN 5 YRS 03/10/2020 [name], MD IMG VIR [facility]   ??? PR BRONCHOSCOPY,DIAGNOSTIC W LAVAGE Bilateral 07/20/2020    Procedure: BRONCHOSCOPY, RIGID OR FLEXIBLE, INCLUDE FLUOROSCOPIC GUIDANCE WHEN PERFORMED; W/BRONCHIAL ALVEOLAR LAVAGE WITH MODERATE SEDATION;  Surgeon: Dellis Filbert, MD;  Location: [facility];  Service: Pulmonary       I reviewed the medical and surgical history personally    Social History:  Tobacco use:   reports that he quit smoking about 13 months ago. His smoking use included cigarettes. He smoked an average of 2 packs per day. He has quit using smokeless tobacco.  His smokeless tobacco use included chew.   Alcohol use:    has no history on file for alcohol use.   Drug use:    has no history on file for drug use.   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 Melanoma Neg Hx    ??? Basal cell carcinoma Neg Hx    ??? Squamous cell carcinoma Neg Hx        Review of Systems:  All other systems reviewed are negative except that in HPI        Vital Signs last 24 hours:  Temp:  [36.7 ??C (98 ??F)] 36.7 ??C (98 ??F)  Heart Rate:  [117-121] 117  SpO2 Pulse:  [118-121] 118  Resp:  [23-35] 23  BP: (91-128)/(52-76) 102/52  MAP (mmHg):  [65-86] 67  FiO2 (%):  [40 %-100 %] 40 %  SpO2:  [94 %-100 %] 96 %    Physical Exam:   Patient Lines/Drains/Airways Status     Active Active Lines, Drains, & Airways     Name Placement date Placement time Site Days    Peripheral IV 03/06/21 Left Arm 03/06/21  --  Arm  less than 1              Const [x]  vital signs above    []  NAD, non-toxic appearance []  Chronically ill-appearing, non-distressed  Ill-appearing,       Eyes [x]  Lids normal bilaterally, conjunctiva anicteric and noninjected OU     [x] PERRL  [] EOMI        ENMT [x]  Normal appearance of external nose and ears, no nasal discharge        []  MMM, no lesions on lips or gums [x]  No thrush, leukoplakia, oral lesions  [x]  Dentition good []  Edentulous []  Dental caries present  [x]  Hearing normal  []  TMs with good light reflexes bilaterally   HFNC in place      Neck [x]  Neck of normal appearance and trachea midline        []  No thyromegaly, nodules, or tenderness   []  Full neck ROM        Lymph [x]  No LAD in neck     []  No LAD in supraclavicular area     []  No LAD in axillae   []  No LAD in epitrochlear chains     []  No LAD in inguinal areas        CV [x]  RRR            [x]  No peripheral edema     []  Pedal pulses intact   [x]  No abnormal heart sounds appreciated   [x]  Extremities WWP         Resp []  Normal WOB at rest    []  No breathlessness with speaking, no coughing  []  CTA anteriorly    []  CTA posteriorly    Diffuse rhonchi throughout anteriorally, mild increased work of breathing      GI [x]  Normal inspection, NTND   [x]  NABS     []  No umbilical hernia on exam       []  No hepatosplenomegaly     []  Inspection of perineal and perianal areas normal        GU [x]  Normal external genitalia     [] No urinary catheter present in urethra   []  No CVA tenderness    []  No tenderness over renal allograft MSK [x]  No clubbing or cyanosis of hands       []  No vertebral point tenderness  []  No focal tenderness or abnormalities on palpation of joints in RUE, LUE, RLE, or LLE        Skin [x]  No rashes, lesions, or ulcers of visualized skin     []  Skin warm and dry to palpation         Neuro [x]  Face expression symmetric  []  Sensation to light touch grossly intact throughout    []  Moves extremities equally    [x]  No tremor noted        []  CNs II-XII grossly intact     []  DTRs normal and symmetric throughout []  Gait unremarkable        Psych [x]  Appropriate affect       [x]  Fluent speech         []   Attentive, good eye contact  [x]  Oriented to person, place, time          []  Judgment and insight are appropriate           Data for Medical Decision Making     Recent Labs   Lab Units 03/06/21  0935   WBC 10*9/L 20.6*   HEMOGLOBIN g/dL 16.1   PLATELET COUNT (1) 10*9/L 179   NEUTRO ABS 10*9/L 18.7*   LYMPHO ABS 10*9/L 0.8*   EOSINO ABS 10*9/L 0.0   SODIUM mmol/L 136   POTASSIUM mmol/L 4.3   BUN mg/dL 21   CREATININE mg/dL 0.96*   GLUCOSE mg/dL 045   CALCIUM mg/dL 7.9*   MAGNESIUM mg/dL 1.2*   BILIRUBIN TOTAL mg/dL 0.7   AST U/L 13   ALT U/L 14     I reviewed and noted the following labs: Leukocytosis. Plt/Hgb stable. Cr up to 3.09 from baseline <2.     Microbiology:  Past cultures were reviewed in Epic and CareEverywhere.  Review of cultures with microbiology: NA    BCx 12/3 x2: pending  UCx 122/3: pending    Imaging:  XR Chest Portable 03/06/2021    Narrative  EXAM: XR CHEST PORTABLE  DATE: 03/06/2021 10:06 AM  ACCESSION: [id number]  DICTATED: 03/06/2021 10:23 AM  INTERPRETATION LOCATION: [facility]    CLINICAL INDICATION: 40 years old Male with PNEUMONIA    COMPARISON: 03/05/2021    TECHNIQUE: Portable Chest Radiograph.    FINDINGS:    Marked interval worsening of airspace consolidation in the right upper and midlung zones. The left lung is clear.    No pleural effusion or pneumothorax.    Stable cardiomediastinal silhouette.    Impression  1. Marked interval worsening of right lung consolidation.       I have reviewed the images from 03/06/21 and agree with the findings.    Independent visualization of images: I independently reviewed the image from (date) and I agree with the findings/interpretation.    Additional Studies:   (12/3) EKG QTc 427    Serologies:  Lab Results   Component Value Date    Hep B Surface Ag Nonreactive 01/21/2020    Hep B S Ab Nonreactive 01/21/2020    Hep B Surf Ab Quant <8.00 01/21/2020    Hepatitis C Ab Nonreactive 01/21/2020    HSV 1 IgG Positive (A) 01/21/2020    HSV 2 IgG Negative 01/21/2020    Toxoplasma Gondii IgG Negative 02/11/2020       Immunizations:  Immunization History   Administered Date(s) Administered   ??? COVID-19 VACCINE,MRNA(MODERNA)(PF) 02/25/2020, 03/23/2020

## 2021-03-06 NOTE — Unmapped (Signed)
Pt BIB EMS for possible pneumonia and sepsis.

## 2021-03-06 NOTE — Unmapped (Signed)
[facility]  Emergency Department Provider Note       ED Clinical Impression     Final diagnoses:   Pneumonia of right lung due to infectious organism, unspecified part of lung (Primary)   Sepsis, due to unspecified organism, unspecified whether acute organ dysfunction present (CMS-HCC)   Hypotension, unspecified hypotension type        HPI, MDM, ED Course   Chief Complaint  Chief Complaint   Patient presents with   ??? Possible Sepsis       HPI   Adam 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  Patient has been feeling unwell since Thanksgiving, has been having cough, shortness of breath, fevers, body aches, and decreased p.o. intake.  Endorses 3-4 episodes of vomiting yesterday.  He was brought in with EMS, he said when they initially arrived to his house he was satting in the 50s on room air, and his blood pressure was hypotensive.  They put him on a nonrebreather as well as with 2 L nasal cannula and his oxygen saturation popped up to 100%.  They also have given him 1.5 L of fluids, and another half liter is going in on their arrival.  Patient also endorses fevers.  Patient was seen yesterday in heme-onc clinic, and his x-ray was concerning for pneumonia, they sent him home on linezolid.  His wife arrived at the bedside, she said that he was looking worse, and so this morning he was acting like he asked when they need to intubate him.        MDM: Very ill-appearing 40 year old male with history above.  Given his hypotension, fevers, tachypnea, do have concern for sepsis secondary to his pneumonia that was diagnosed yesterday.  He met sepsis criteria at his office visit yesterday as well.  Patient has already received 1.5 L of fluids, and is getting another half liter right now.  We will add on another half liter to meet his 30 cc/kg of ideal body weight.  Will antibiosis with aztreonam, Flagyl, and linezolid given his allergies to cefepime and vancomycin.  Otherwise we will continue with septic work-up with labs, blood cultures, urinalysis, and repeat chest x-ray.  Patient also had an RPP done yesterday, and was diagnosed with the flu, and this may be contributing to his presentation today as well.        ED Course as of 03/06/21 1204   Sat Mar 06, 2021   1610 Patient's initial lactate of 4.1.   0955 WBC(!): 20.6   1019 Creatinine(!): 3.09   1037 Patient's magnesium 1.2.  We will replete with 2 g of  Magnesium.   1058 Discussed with Caryn Bee from the ICU team.  They are going to come down and evaluate the patient, and establishes appropriateness for ICU versus stepdown.   1130 Patient's lactate improved to 2.5.  His blood pressure has also improved after fluid.         ____________________________________________    The case was discussed with Dr. Ree Edman who is in agreement with the above assessment and plan    Dictation software was used while making this note. Please excuse any errors made with dictation software.    Additional Medical Decision Making     I have reviewed the vital signs and the nursing notes. Labs and radiology results that were available during my care of the patient  were independently reviewed by me and considered in my medical decision making.     I independently visualized the EKG tracing if performed  I independently visualized the radiology images if performed  I reviewed the patient's prior medical records if available.  Additional history obtained from family if available  I discussed the case with the admitting provider and the consulting services if the patient was admitted and/or consulting services were utilized.     History       ROS:     Review of Systems  Constitutional: Negative for fever, recent weight loss/weight gain, chills.  Eyes: Negative for visual changes.  ENT: Negative for sore throat.  Cardiovascular: Negative for chest pain  Respiratory: Negative for shortness of breath and cough  Gastrointestinal: Negative for abdominal pain, vomiting, diarrhea.  Genitourinary: Negative for dysuria and urinary frequency.  Musculoskeletal: Negative for back pain.  Skin: Negative for rash.  Neurological: Negative for headaches, focal weakness or numbness.      Past Medical History:   Diagnosis Date   ??? 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 BONE MARROW BIOPSY & ASPIRATION  01/21/2020        ??? CHG [place], CHEST,REAL TIME  11/20/2020    Procedure: ULTRASOUND, CHEST, REAL TIME WITH IMAGE DOCUMENTATION;  Surgeon: Jerelyn Charles, MD;  Location: [facility] PROCED[facility];  Service: Pulmonary   ??? IR INSERT PORT AGE GREATER THAN 5 YRS  03/10/2020    IR INSERT PORT AGE GREATER THAN 5 YRS 03/10/2020 [name], MD IMG VIR [facility]   ??? PR BRONCHOSCOPY,DIAGNOSTIC W LAVAGE Bilateral 07/20/2020    Procedure: BRONCHOSCOPY, RIGID OR FLEXIBLE, INCLUDE FLUOROSCOPIC GUIDANCE WHEN PERFORMED; W/BRONCHIAL ALVEOLAR LAVAGE WITH MODERATE SEDATION;  Surgeon: Dellis Filbert, MD;  Location: [facility] PROCED[facility] LAB [facility];  Service: Pulmonary         Current Facility-Administered Medications:   ???  lactated ringers bolus 1,000 mL, 1,000 mL, Intravenous, Once, Zettie Pho, MD  ???  magnesium sulfate in water 2 gram/50 mL (4 %) IVPB 2 g, 2 g, Intravenous, Once, Vilinda Blanks., MD    Current Outpatient Medications:   ???  acetaminophen (TYLENOL) 325 MG tablet, Take 650 mg by mouth every six (6) hours as needed for pain. , Disp: , Rfl:   ???  albuterol HFA 90 mcg/actuation inhaler, Inhale 2 puffs every six (6) hours as needed for wheezing., Disp: 8 g, Rfl: 11  ???  amLODIPine (NORVASC) 10 MG tablet, Take 1 tablet (10 mg total) by mouth daily., Disp: 30 tablet, Rfl: 0  ???  aspirin 81 MG chewable tablet, Chew 1 tablet (81 mg total) daily., Disp: 36 tablet, Rfl: 11  ???  carvediloL (COREG) 12.5 MG tablet, Take 1 tablet (12.5 mg total) by mouth Two (2) times a day., Disp: 180 tablet, Rfl: 1  ???  clonazePAM (KLONOPIN) 0.5 MG tablet, Take 1 tablet (0.5 mg total) by mouth two (2) times a day as needed for anxiety or sleep., Disp: 60 tablet, Rfl: 1  ???  gabapentin (NEURONTIN) 300 MG capsule, Take 1 capsule (300 mg total) by mouth Three (3) times a day., Disp: 270 capsule, Rfl: 6  ???  HYDROmorphone (DILAUDID) 2 MG tablet, Take 1 tablet (2 mg total) by mouth every six (6) hours as needed for pain,moderate (4-6)., Disp: 30 tablet, Rfl: 0  ???  linezolid (  ZYVOX) 600 mg tablet, Take 1 tablet (600 mg total) by mouth Two (2) times a day., Disp: 28 tablet, Rfl: 0  ???  nortriptyline (PAMELOR) 50 MG capsule, Take 2 capsules (100 mg total) by mouth nightly., Disp: 60 capsule, Rfl: 11  ???  oseltamivir (TAMIFLU) 75 MG capsule, Take 1 capsule (75 mg total) by mouth Two (2) times a day for 5 days., Disp: 10 capsule, Rfl: 0  ???  OXYCONTIN 15 mg 12 hr crush resistant ER/CR tablet, Take 1 tablet (15 mg total) by mouth Two (2) times a day., Disp: 56 tablet, Rfl: 0  ???  PONATinib (ICLUSIG) 30 mg tablet, Take 1 tablet (30 mg total) by mouth daily. Swallow tablets whole. Do not crush, break, cut or chew tablets., Disp: 30 tablet, Rfl: 5  ???  sulfamethoxazole-trimethoprim (BACTRIM DS) 800-160 mg per tablet, Take 1 tablet (160 mg of trimethoprim total) by mouth 2 times a day on Saturday, Sunday. For prophylaxis while on chemo., Disp: 48 tablet, Rfl: 3  ???  umeclidinium-vilanteroL (ANORO ELLIPTA) 62.5-25 mcg/actuation inhaler, Inhale 1 puff daily., Disp: 60 each, Rfl: 11  ???  valACYclovir (VALTREX) 500 MG tablet, Take 1 tablet (500 mg total) by mouth daily., Disp: 90 tablet, Rfl: 11  ???  OXYCONTIN 15 mg 12 hr crush resistant ER/CR tablet, Take 1 tablet (15 mg total) by mouth Two (2) times a day., Disp: 56 tablet, Rfl: 0    Allergies  Bupropion hcl, Cefepime, Ceftaroline fosamil, Dapsone, Onion, Vancomycin analogues, Bismuth subsalicylate, and Furosemide    Family History   Problem Relation Age of Onset ??? Melanoma Neg Hx    ??? Basal cell carcinoma Neg Hx    ??? Squamous cell carcinoma Neg Hx        Social History  Social History     Tobacco Use   ??? Smoking status: Former     Packs/day: 2.00     Types: Cigarettes     Quit date: 01/20/2020     Years since quitting: 1.1   ??? Smokeless tobacco: Former     Types: Financial planner   ??? Vaping Use: Never used        Physical Exam     VITAL SIGNS:      Vitals:    03/06/21 1015 03/06/21 1030 03/06/21 1045 03/06/21 1100   BP: 91/52 97/65 98/66  109/62   Pulse: 120 120 120 119   Resp: 28 (!) 32 30 28   Temp:       SpO2: 98%   94%       Constitutional: Alert and oriented. Well appearing and in no distress.  Eyes: Conjunctivae are normal.  Mouth/Throat: Mucous membranes are moist. No oropharyngeal exudate or erythema.  Cardiovascular: Normal rate, regular rhythm. No murmurs, rubs, or gallops appreciated.  Respiratory: Normal respiratory effort. Breath sounds are normal. No adventitious breath sounds.  Gastrointestinal: Soft and nontender to palpation. No rebound or guarding.   Genitourinary: No suprapubic tenderness.   Musculoskeletal: Normal range of motion in all extremities. No obvious deformity in any extremity.  Neurologic: Appearance without facial droop, language without expressive or receptive aphasia. Moves all extremities equally. No gross focal neurologic deficits are appreciated.   Skin: Skin is warm, dry and intact. No rash noted. No obvious skin breakdown.        Radiology     XR Chest Portable   Final Result      1. Marked interval worsening of right lung consolidation.  ED POCUS Chest Bilateral   Final Result           Laboratory Data     Lab Results   Component Value Date    WBC 20.6 (H) 03/06/2021    HGB 13.3 03/06/2021    HCT 39.4 03/06/2021    PLT 179 03/06/2021       Lab Results   Component Value Date    NA 136 03/06/2021    K 4.3 03/06/2021    CL 106 03/06/2021    CO2 17.0 (L) 03/06/2021    BUN 21 03/06/2021    CREATININE 3.09 (H) 03/06/2021    GLU 111 03/06/2021    CALCIUM 7.9 (L) 03/06/2021    MG 1.2 (L) 03/06/2021    PHOS 4.4 01/13/2021       Lab Results   Component Value Date    BILITOT 0.7 03/06/2021    BILIDIR <0.10 11/19/2020    PROT 5.8 03/06/2021    ALBUMIN 3.0 (L) 03/06/2021    ALT 14 03/06/2021    AST 13 03/06/2021    ALKPHOS 62 03/06/2021       Lab Results   Component Value Date    INR 0.84 12/01/2020    APTT 38.0 (H) 04/15/2020       Pertinent labs & imaging results that were available during my care of the patient were reviewed by me and considered in my medical decision making (see chart for details).    Portions of this record have been created using Scientist, clinical (histocompatibility and immunogenetics). Dictation errors have been sought, but may not have been identified and corrected.    Anitra Lauth, MD  EM     Len Blalock Cathlean Marseilles., MD  Resident  03/06/21 [phone]

## 2021-03-06 NOTE — Unmapped (Addendum)
Items for outpatient follow up:    [ ]  ***    Adam 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 Hospital course by problem below.    #Acute hypoxic respiratory failure 2/2 influenza and***bacterial pneumonia  Presented with several days of malaise, shortness of breath, coughing.  Was hypoxic requiring high flow nasal cannula, and unfortunately hypoxia progressed to requiring intubation, mechanical ventilation, and proning.  Immunocompromise infectious disease was consulted, and He was treated with linezolid, aztreonam, oseltamivir.  lower respiratory culture showed***.    Chronic loculated effusion:  Had prior attempts at thoracentesis which were unsuccessful in the outpatient setting.  He had seen thoracic surgery in the outpatient setting as well who recommended against surgical exploration.***    # Chronic Medical Problems  Chronic pain:  MDD and anxiety  COPD: Continued home Anoro Ellipta as possible  AKI: Likely prerenal in nature 2/2 poor oral intake and sepsis.  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

## 2021-03-06 NOTE — Unmapped (Signed)
ED Procedure Note    Critical Care  Performed by: Luther Redo, MD  Authorized by: Luther Redo, MD     Critical care provider statement:     Critical care time (minutes):  30    Critical care time was exclusive of:  Teaching time    Critical care was necessary to treat or prevent imminent or life-threatening deterioration of the following conditions:  Shock, sepsis and respiratory failure (hypoxic and hypotensive with EMS, give 2.5 liters IFV, placed on high flow nasal cannula, covered with broad spectrum Abx)    Critical care was time spent personally by me on the following activities:  Ordering and review of laboratory studies, ordering and performing treatments and interventions, ordering and review of radiographic studies, evaluation of patient's response to treatment, re-evaluation of patient's condition and examination of patient    Care discussed with: admitting provider    Comments:      After 2.5 liters IVF, MAP has stabilized, patient no longer hypotensive. patient still tachycardic.   On high flow Catherine patient's breathing seems less labored, SpO2 mid-upper 90s. ICU has assessed patient and will admit

## 2021-03-06 NOTE — Unmapped (Signed)
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 *      HPI: Adam [name] with PMHx of ALL, COPD, prior MRSA bacteremia requiring intubation, MDD, lower back pain, tobacco use hx, presenting with nausea, vomiting, fever initially found to be hypotensive and tachycardic at oncologists office, started on linezolid. SOB worsened in subsequent 24hrs prompting presentation to the ED. Admitted to the MICU due to c/f acute hypoxic respiratory failure secondary to influenza infection with superimposed PNA.     Neurological   Chronic Pain - Peripheral neuropathy  Has chronic pain at baseline from ALL. Follows with [facility]. Reduced oxycodone doses in efforts to not worsen respiratory drive. Reduced home gabapentin secondary to present AKI.   - Cont home oxycodone 10mg  q 12hr SCH   - Cont home gabapentin 100mg  TID [facility]  - PRNs: oxy 5mg  q6hr, tylenol 650mg  q 6  - Hold home Amytriptyline given delirium risk, low threshold to restart    MDD  Halved home klonopin to reduce efforts in worsening respiratory drive.  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 Patient's wife states he can receive each medication independently, but not together.     Pulmonary   AHRF 2/2 Influenza w/ superimposed PNA   Saturating well on 40% FiO2, initially presented to oncologist office outpatient with hypotension, tachypnea, started on linezolid. Had worsening shortness of breath over the past 24hrs per wife at bedside, prompting presentation to the ED.   - Continue HFNC  - Continue Abx as indicated below   - Droplet Precautions   - Follow serial VBGs to ensure appropriately compensating for acidosis    COPD  20 ppy tobacco use hx, followed by Pulmonology outpt. Per 01/25/21 no prior exacerbations. Utilizes home albuterol.   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 Underwent unsuccessful thoracentesis, at the time referred to CT surgery, at the time didn't think warranted surgical intervention.   - CTM    Cardiovascular   Sinus tachycardia 2/2 Sepsis:  Initially with MAPs < 65, but improved s/p 2.5 L  LR prior to admission. Given additional 1L upon evaluation by primary team.  - 500cc LR additional bolus  - CTM  - Will monitor fluid status closely    Renal   AKI  Likely prerenal. Dry on exam. S/p 1L.  - trend lactate  - f/u BMP  - Monitor I&O  - Avoid nephrotoxins    Infectious Disease/Autoimmune   Influenza c/b PNA - Hx of MRSA Bacteremia  Leukocytosis with neutrophil predominance, febrile, myalgias. CXR reveals alveolar process  covering the entire R side, with signifcant progression from 12/2 CXR. Empirically given Linezolid/Aztreonam in the ED. 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  - Infectious disease Immunocomp consulted, follow reccs.  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    Pre-Arrival Fluid Volume:  PTA IVF Intake (mL) : 2000    Did the patient have pre-arrival hypotension (SBP <90 and/or MAP <65)?      Were there 2 or more episodes of hypotension within the last 3 hours?        I personally examined this patient at 03/06/2021 2:00 PM and attest that I have completed a focused tissue perfusion assessment.    Physical Assessment:    Capillary Refill:  < or = 2 seconds    Skin Color:  No signs of cyanosis and Pale    Peripheral Pulses:  Peripheral pulses strong    History of DRESS syndrome 2/2 cephalosporin  - consulted ICID for reccs re: abx regimen  - Avoid beta lactams    FEN/GI   Hypomagnesemia  Found to be 1.2, repleted and wnl on repeat check.  - Maintain value > 2.0  - CTM     Malnutrition Assessment: Not done yet.  There is no height or weight on file to calculate BMI.           Heme/Coag   Ph+ B-ALL:  Diagnosed 01/21/2020, seen by [facility]. 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  He subsequently had difficulty tolerating single agent dasatinib (as a bridge to planned ponatinib-blinatumomab--had fluid retention and pleural effusion).  He then completed  4 cycles of ponatinib-blinatumomab, initiated for treatment of MRD in the setting of poor tolerance of standard therapy. He is now MRD-negative with BCR-ABL level of 0.002% on marrow. Now on ponatinib monotherapy  - heme on board: Recc holding home TKI    DVT PPx  - subcutaneous heparin 5000 units q 8hr    Endocrine NAI    Integumentary   - WOCN consulted for high risk skin assessment No. Reason: Not indicated..  - cont pressure mitigating precautions per skin policy    Prophylaxis/LDA/Restraints/Consults   Can CVC be removed? N/A, no CVC present (including vascular catheter for HD or PLEX)   Can A-line be removed? N/A, no A-line present  Can Foley be removed? N/A, no Foley present  Mobility plan: Step 2 - Head of bed elevation (>60 degrees)    Feeding: Oral diet  Analgesia: Pain adequately  controlled  Sedation SAT/SBT: N/A  Thromboembolic ppx: SQ heparin  Head of bed >30 degrees: Yes  Ulcer ppx: Not indicated  Glucose within target range: Yes, in range    Does patient need/have an active type/screen? No    RASS at goal? N/A, not on sedation  [place] Agitation Assessment Scale (RASS) : 0 (11/10/2020  8:00 AM)     Can antipsychotics be stopped? N/A, not on antipsychotics  CAM-ICU Result: Negative (11/09/2020  9:00 PM)      Would hospice care be appropriate for this patient? No, patient improving or expected to improve  Any unaddressed hospice/palliative care needs? no    Patient Lines/Drains/Airways Status     Active Active Lines, Drains, & Airways     Name Placement date Placement time Site Days    Peripheral IV 03/06/21 Left Arm 03/06/21  --  Arm  less than 1              Patient Lines/Drains/Airways Status     Active Wounds     None                Goals of Care     Code Status: Full Code    Designated Healthcare Decision Maker:  Mr. Eltzroth current decisional capacity for healthcare decision-making is Full capacity. His designated Educational psychologist) is/are   HCDM (patient stated preference): Jafet, [place] - Spouse - [phone].      Subjective     HPI:  Adam [name] with past medical history significant for Ph+ B-ALL, COPD, lower back pain, hx of prior intubations & MRSA bacteremia here with nausea, vomiting, diffuse myalgias, hypotension and tachycardia after presenting to outpatient oncologist day prior started on linezolid. Presented to ED due to acute worsening of shortness of breath within the last 24 hours. Wife at bedside endorses that patient has generally felt worse since about thanksgiving time. Per wife, patient has had multiple prior hospitalizations for MRSA bacteremia and additionally has a known pleural effusion that had been monitored by other doctors. Denies any recent sick contacts. Patient is currently taking ponatinib for B-ALL and several prophylactic agents for immunocompromised state. Wife at bedside additionally identifies that patient has sensorineural hearing loss secondary to lasix/vanc, states he can receive each medication independently. At this present moment, patient endorses nausea/vomiting, shortness of breath, chest pain, lower back pain and night sweats. Last known bowel movement was about 2 days ago. Denies productive cough, wheezing, new rash, radiation of chest pain to arms, abdominal pain, suprapubic pain, dysuria, increased frequency, notable swelling in LE or pain in calves.     ROS: Ten-point review of systems is obtained and is negative except as noted in HPI.    Allergies  Allergies   Allergen Reactions   ??? Bupropion Hcl Other (See Comments)     Per patient out of touch with reality, suicidal, homicidal   ??? Cefepime Rash     DRESS   ??? Ceftaroline Fosamil Rash and Other (See Comments)     Rash X 2 02/2020, suspected DRESS 06/2020   ??? 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 Onion Anaphylaxis   ??? Vancomycin Analogues      Hearing loss with Lasix  Other reaction(s): Other (See Comments)  Hearing loss  lasix   ??? Bismuth Subsalicylate Nausea And Vomiting   ??? Furosemide      With Vancomycin caused hearing loss  Other  reaction(s): Other (See Comments)  With Vancomycin caused hearing loss       Meds  No current facility-administered medications on file prior to encounter.     Current Outpatient Medications on File Prior to Encounter   Medication Sig   ??? acetaminophen (TYLENOL) 325 MG tablet Take 650 mg by mouth every six (6) hours as needed for pain.    ??? albuterol HFA 90 mcg/actuation inhaler Inhale 2 puffs every six (6) hours as needed for wheezing.   ??? amLODIPine (NORVASC) 10 MG tablet Take 1 tablet (10 mg total) by mouth daily.   ??? aspirin 81 MG chewable tablet Chew 1 tablet (81 mg total) daily.   ??? carvediloL (COREG) 12.5 MG tablet Take 1 tablet (12.5 mg total) by mouth Two (2) times a day.   ??? clonazePAM (KLONOPIN) 0.5 MG tablet Take 1 tablet (0.5 mg total) by mouth two (2) times a day as needed for anxiety or sleep.   ??? gabapentin (NEURONTIN) 300 MG capsule Take 1 capsule (300 mg total) by mouth Three (3) times a day.   ??? HYDROmorphone (DILAUDID) 2 MG tablet Take 1 tablet (2 mg total) by mouth every six (6) hours as needed for pain,moderate (4-6).   ??? linezolid (ZYVOX) 600 mg tablet Take 1 tablet (600 mg total) by mouth Two (2) times a day.   ??? nortriptyline (PAMELOR) 50 MG capsule Take 2 capsules (100 mg total) by mouth nightly.   ??? oseltamivir (TAMIFLU) 75 MG capsule Take 1 capsule (75 mg total) by mouth Two (2) times a day for 5 days.   ??? OXYCONTIN 15 mg 12 hr crush resistant ER/CR tablet Take 1 tablet (15 mg total) by mouth Two (2) times a day.   ??? PONATinib (ICLUSIG) 30 mg tablet Take 1 tablet (30 mg total) by mouth daily. Swallow tablets whole. Do not crush, break, cut or chew tablets.   ??? sulfamethoxazole-trimethoprim (BACTRIM DS) 800-160 mg per tablet Take 1 tablet (160 mg of trimethoprim total) by mouth 2 times a day on Saturday, Sunday. For prophylaxis while on chemo.   ??? umeclidinium-vilanteroL (ANORO ELLIPTA) 62.5-25 mcg/actuation inhaler Inhale 1 puff daily.   ??? valACYclovir (VALTREX) 500 MG tablet Take 1 tablet (500 mg total) by mouth daily.   ??? OXYCONTIN 15 mg 12 hr crush resistant ER/CR tablet Take 1 tablet (15 mg total) by mouth Two (2) times a day.       Past Medical History  Past Medical History: Diagnosis Date   ??? 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 BONE MARROW BIOPSY & ASPIRATION  01/21/2020        ??? CHG [place], CHEST,REAL TIME  11/20/2020    Procedure: ULTRASOUND, CHEST, REAL TIME WITH IMAGE DOCUMENTATION;  Surgeon: Jerelyn Charles, MD;  Location: BRON[facility] PROCEDURE LAB [facility];  Service: Pulmonary   ??? IR INSERT PORT AGE GREATER THAN 5 YRS  03/10/2020    IR INSERT PORT AGE GREATER THAN 5 YRS 03/10/2020 [name], MD IMG VIR H&V [facility]   ??? PR BRONCHOSCOPY,DIAGNOSTIC W LAVAGE Bilateral 07/20/2020    Procedure: BRONCHOSCOPY, RIGID OR FLEXIBLE, INCLUDE FLUOROSCOPIC GUIDANCE WHEN PERFORMED; W/BRONCHIAL ALVEOLAR LAVAGE WITH MODERATE SEDATION;  Surgeon: Dellis Filbert, MD;  Location: [facility] PROCEDURE LAB [facility];  Service: Pulmonary       Family History  Family History   Problem Relation Age  of Onset   ??? Melanoma Neg Hx    ??? Basal cell carcinoma Neg Hx    ??? Squamous cell carcinoma Neg Hx        Social History  Social History     Socioeconomic History   ??? Marital status: Married   Tobacco Use   ??? Smoking status: Former     Packs/day: 2.00     Types: Cigarettes     Quit date: 01/20/2020     Years since quitting: 1.1   ??? Smokeless tobacco: Former     Types: Financial planner   ??? Vaping Use: Never used   Other Topics Concern   ??? Do you use sunscreen? No   ??? Tanning bed use? No   ??? Are you easily burned? Yes   ??? Excessive sun exposure? No   ??? Blistering sunburns? Yes     Social Determinants of Health     Financial Resource Strain: Low Risk    ??? Difficulty of Paying Living Expenses: Not very hard   Food Insecurity: No Food Insecurity   ??? Worried About Running Out of Food in the Last Year: Never true   ??? Ran Out of Food in the Last Year: Never true   Transportation Needs: No Transportation Needs   ??? Lack of Transportation (Medical): No   ??? Lack of Transportation (Non-Medical): No       Objective     Vitals - past 24 hours  Temp:  [36.7 ??C (98 ??F)-38.4 ??C (101.1 ??F)] 38.4 ??C (101.1 ??F)  Heart Rate:  [117-130] 130  SpO2 Pulse:  [118-121] 118  Resp:  [23-35] 33  BP: (91-130)/(52-79) 130/79  FiO2 (%):  [40 %-100 %] 40 %  SpO2:  [94 %-100 %] 94 % Intake/Output  No intake/output data recorded.     Physical Exam:    General: Appears uncomfortable, shallow breathing.  HEENT: NCAT, reports sinus tenderness to palpation. EOMI. Dry mucus membranes.  CV: sinus tachycardia, no m/r/g. No clubbing  Pulm: Tachypnic, coarse crackles present in R > L.   GI: Abdomen distended, soft, non-tender. Bowel sounds rare.  MSK: able to freely move each extremity. No rigidity. Diffuse myalgia. No LE swelling/size discrepancy. Denies CVA tenderness, lower back tenderness.  Skin: No apparent rash. No evident bruising.   Neuro: AOx3, calm, mood normal.    The patient's hospital stay has been complicated by the following clinically significant conditions requiring additional evaluation and treatment or having a significant effect of this patient's care: - Hypomagnesaemia POA requiring further investigation, treatment, or monitoring  - Acidosis POA requiring further investigation, treatment, or monitoring  - Dehydration POA requiring further investigation, treatment, or monitoring    There is no height or weight on file to calculate BMI.            Wt Readings from Last 12 Encounters:   03/05/21 (!) 109.5 kg (241 lb 4.8 oz)   02/11/21 (!) 111 kg (244 lb 11.2 oz)   02/02/21 (!) 110.4 kg (243 lb 6.4 oz)   01/25/21 (!) 109.3 kg (241 lb)   01/13/21 (!) 110 kg (242 lb 6.4 oz)   01/11/21 (!) 110 kg (242 lb 8.1 oz)   01/04/21 (!) 110.2 kg (242 lb 15.2 oz)   12/28/20 (!) 107 kg (235 lb 12.8 oz)   12/28/20 (!) 107 kg (235 lb 12.8 oz)   12/18/20 (!) 108.3 kg (238 lb 11.2 oz)   12/14/20 (!) 109.6 kg (241 lb 10  oz)   12/14/20 (!) 109.6 kg (241 lb 11.2 oz)       Continuous Infusions:       Scheduled Medications:   ??? azithromycin  500 mg Intravenous Q24H   ??? aztreonam  2 g Intravenous Q12H   ??? DAPTOmycin  4 mg/kg Intravenous Q24H   ??? gabapentin  100 mg Oral TID   ??? heparin (porcine) for subcutaneous use  5,000 Units Subcutaneous [facility]   ??? lactated ringers  500 mL Intravenous Once   ??? linezolid  600 mg Intravenous Q12H [facility]   ??? [START ON 03/07/2021] oseltamivir  30 mg Oral BID   ??? oseltamivir  75 mg Oral Once   ??? oxyCODONE  10 mg Oral Q12H [facility]   ??? sulfamethoxazole-trimethoprim  1 tablet Oral 2 times per day on Sun Sat   ??? umeclidinium-vilanteroL  1 puff Inhalation Daily   ??? valACYclovir  500 mg Oral Daily       PRN medications:      Data/Imaging Review: Reviewed in Epic and personally interpreted on 03/06/2021. See EMR for detailed results.      Regino Schultze, MD  [facility], PGY-1

## 2021-03-06 NOTE — Unmapped (Addendum)
BRIEF HEMATOLOGY TREATMENT PLAN    40 y.o. 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 The course was complicated by septic shock, candida krusei fungemia, MRSA bacteremia with septic emboli c/b acute renal failure and respiratory distress requiring dialysis and intubation.  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  He subsequently had difficulty tolerating single agent dasatinib (as a bridge to planned ponatinib-blinatumomab--had fluid retention and pleural effusion).  He then completed  4 cycles of ponatinib-blinatumomab, initiated for treatment of MRD in the setting of poor tolerance of standard therapy. He is now MRD-negative with BCR-ABL level of 0.002% on marrow. Patient is now on ponatinib 30mg  monotherapy (since 01/2021). Seen in ID clinic 12/2 with fever/cough, positive for flu A, CXR concerning for pneumonia, sent home on linezolid. Re-presented to ED with worsening symptoms, including hypotension, hypoxia. WBC 20.6. Lactate 4.1 -> 2.5 after fluids. Admitted to MICU. Hematology consulted for continuation of TKI ponatanib for B-ALL.    -Full Hematology consult to follow  -Hold ponatanib in setting of sepsis (we will evaluate about restarting in next 48-72 hours) given risk of arterial thrombosis with ponatanib.    Patient discussed with Dr. Leotis Pain    [name], MD PhD  Hematology/Oncology, PGY-4

## 2021-03-07 LAB — BASIC METABOLIC PANEL
ANION GAP: 10 mmol/L (ref 5–14)
ANION GAP: 8 mmol/L (ref 5–14)
BLOOD UREA NITROGEN: 35 mg/dL — ABNORMAL HIGH (ref 9–23)
BLOOD UREA NITROGEN: 33 mg/dL — ABNORMAL HIGH (ref 9–23)
BUN / CREAT RATIO: 14
BUN / CREAT RATIO: 15
CALCIUM: 8.2 mg/dL — ABNORMAL LOW (ref 8.7–10.4)
CALCIUM: 8.1 mg/dL — ABNORMAL LOW (ref 8.7–10.4)
CHLORIDE: 105 mmol/L (ref 98–107)
CHLORIDE: 104 mmol/L (ref 98–107)
CO2: 20 mmol/L (ref 20.0–31.0)
CO2: 21 mmol/L (ref 20.0–31.0)
CREATININE: 2.5 mg/dL — ABNORMAL HIGH
CREATININE: 2.21 mg/dL — ABNORMAL HIGH
EGFR CKD-EPI (2021) MALE: 32 mL/min/{1.73_m2} — ABNORMAL LOW (ref >=60)
EGFR CKD-EPI (2021) MALE: 37 mL/min/{1.73_m2} — ABNORMAL LOW (ref >=60)
GLUCOSE RANDOM: 94 mg/dL (ref 70–179)
GLUCOSE RANDOM: 98 mg/dL (ref 70–99)
POTASSIUM: 4.5 mmol/L (ref 3.4–4.8)
POTASSIUM: 4.4 mmol/L (ref 3.4–4.8)
SODIUM: 135 mmol/L (ref 135–145)
SODIUM: 133 mmol/L — ABNORMAL LOW (ref 135–145)

## 2021-03-07 LAB — BLOOD GAS CRITICAL CARE PANEL, ARTERIAL
BASE EXCESS ARTERIAL: -4.8 — ABNORMAL LOW (ref -2.0–2.0)
BASE EXCESS ARTERIAL: -5 — ABNORMAL LOW (ref -2.0–2.0)
BASE EXCESS ARTERIAL: -4.8 — ABNORMAL LOW (ref -2.0–2.0)
BASE EXCESS ARTERIAL: -5.5 — ABNORMAL LOW (ref -2.0–2.0)
CALCIUM IONIZED ARTERIAL (MG/DL): 4.22 mg/dL — ABNORMAL LOW (ref 4.40–5.40)
CALCIUM IONIZED ARTERIAL (MG/DL): 4.45 mg/dL (ref 4.40–5.40)
CALCIUM IONIZED ARTERIAL (MG/DL): 4.37 mg/dL — ABNORMAL LOW (ref 4.40–5.40)
CALCIUM IONIZED ARTERIAL (MG/DL): 4.57 mg/dL (ref 4.40–5.40)
FIO2 ARTERIAL: 100
GLUCOSE WHOLE BLOOD: 109 mg/dL (ref 70–179)
GLUCOSE WHOLE BLOOD: 98 mg/dL (ref 70–179)
GLUCOSE WHOLE BLOOD: 98 mg/dL (ref 70–179)
GLUCOSE WHOLE BLOOD: 109 mg/dL (ref 70–179)
HCO3 ARTERIAL: 20 mmol/L — ABNORMAL LOW (ref 22–27)
HCO3 ARTERIAL: 19 mmol/L — ABNORMAL LOW (ref 22–27)
HCO3 ARTERIAL: 20 mmol/L — ABNORMAL LOW (ref 22–27)
HCO3 ARTERIAL: 20 mmol/L — ABNORMAL LOW (ref 22–27)
HEMOGLOBIN BLOOD GAS: 13 g/dL — ABNORMAL LOW
HEMOGLOBIN BLOOD GAS: 12.6 g/dL — ABNORMAL LOW
HEMOGLOBIN BLOOD GAS: 12.4 g/dL — ABNORMAL LOW
HEMOGLOBIN BLOOD GAS: 11.7 g/dL — ABNORMAL LOW
LACTATE BLOOD ARTERIAL: 1.1 mmol/L (ref <1.3)
LACTATE BLOOD ARTERIAL: 1 mmol/L (ref <1.3)
LACTATE BLOOD ARTERIAL: 0.9 mmol/L (ref <1.3)
LACTATE BLOOD ARTERIAL: 0.9 mmol/L (ref <1.3)
O2 SATURATION ARTERIAL: 94.7 % (ref 94.0–100.0)
O2 SATURATION ARTERIAL: 97.7 % (ref 94.0–100.0)
O2 SATURATION ARTERIAL: 93.8 % — ABNORMAL LOW (ref 94.0–100.0)
O2 SATURATION ARTERIAL: 97.8 % (ref 94.0–100.0)
PCO2 ARTERIAL: 34.6 mmHg — ABNORMAL LOW (ref 35.0–45.0)
PCO2 ARTERIAL: 35 mmHg (ref 35.0–45.0)
PCO2 ARTERIAL: 36.1 mmHg (ref 35.0–45.0)
PCO2 ARTERIAL: 43 mmHg (ref 35.0–45.0)
PH ARTERIAL: 7.37 (ref 7.35–7.45)
PH ARTERIAL: 7.36 (ref 7.35–7.45)
PH ARTERIAL: 7.36 (ref 7.35–7.45)
PH ARTERIAL: 7.29 — ABNORMAL LOW (ref 7.35–7.45)
PO2 ARTERIAL: 73.4 mmHg — ABNORMAL LOW (ref 80.0–110.0)
PO2 ARTERIAL: 96.5 mmHg (ref 80.0–110.0)
PO2 ARTERIAL: 73.1 mmHg — ABNORMAL LOW (ref 80.0–110.0)
PO2 ARTERIAL: 106 mmHg (ref 80.0–110.0)
POTASSIUM WHOLE BLOOD: 4.2 mmol/L (ref 3.4–4.6)
POTASSIUM WHOLE BLOOD: 4.2 mmol/L (ref 3.4–4.6)
POTASSIUM WHOLE BLOOD: 4.1 mmol/L (ref 3.4–4.6)
POTASSIUM WHOLE BLOOD: 4 mmol/L (ref 3.4–4.6)
SODIUM WHOLE BLOOD: 131 mmol/L — ABNORMAL LOW (ref 135–145)
SODIUM WHOLE BLOOD: 135 mmol/L (ref 135–145)
SODIUM WHOLE BLOOD: 133 mmol/L — ABNORMAL LOW (ref 135–145)
SODIUM WHOLE BLOOD: 133 mmol/L — ABNORMAL LOW (ref 135–145)

## 2021-03-07 LAB — HEPATIC FUNCTION PANEL
ALBUMIN: 2.6 g/dL — ABNORMAL LOW (ref 3.4–5.0)
ALKALINE PHOSPHATASE: 79 U/L (ref 46–116)
ALT (SGPT): 10 U/L (ref 10–49)
AST (SGOT): 9 U/L (ref <=34)
BILIRUBIN DIRECT: 0.2 mg/dL (ref 0.00–0.30)
BILIRUBIN TOTAL: 0.4 mg/dL (ref 0.3–1.2)
PROTEIN TOTAL: 5.7 g/dL (ref 5.7–8.2)

## 2021-03-07 LAB — BLOOD GAS, ARTERIAL
BASE EXCESS ARTERIAL: -4.2 — ABNORMAL LOW (ref -2.0–2.0)
HCO3 ARTERIAL: 20 mmol/L — ABNORMAL LOW (ref 22–27)
O2 SATURATION ARTERIAL: 93.2 % — ABNORMAL LOW (ref 94.0–100.0)
PCO2 ARTERIAL: 34.9 mmHg — ABNORMAL LOW (ref 35.0–45.0)
PH ARTERIAL: 7.38 (ref 7.35–7.45)
PO2 ARTERIAL: 69.4 mmHg — ABNORMAL LOW (ref 80.0–110.0)

## 2021-03-07 LAB — CBC W/ AUTO DIFF
BASOPHILS ABSOLUTE COUNT: 0 10*9/L (ref 0.0–0.1)
BASOPHILS RELATIVE PERCENT: 0.2 %
EOSINOPHILS ABSOLUTE COUNT: 0 10*9/L (ref 0.0–0.5)
EOSINOPHILS RELATIVE PERCENT: 0.2 %
HEMATOCRIT: 35.2 % — ABNORMAL LOW (ref 39.0–48.0)
HEMOGLOBIN: 12 g/dL — ABNORMAL LOW (ref 12.9–16.5)
LYMPHOCYTES ABSOLUTE COUNT: 0.5 10*9/L — ABNORMAL LOW (ref 1.1–3.6)
LYMPHOCYTES RELATIVE PERCENT: 2.3 %
MEAN CORPUSCULAR HEMOGLOBIN CONC: 34.2 g/dL (ref 32.0–36.0)
MEAN CORPUSCULAR HEMOGLOBIN: 28.9 pg (ref 25.9–32.4)
MEAN CORPUSCULAR VOLUME: 84.4 fL (ref 77.6–95.7)
MEAN PLATELET VOLUME: 8 fL (ref 6.8–10.7)
MONOCYTES ABSOLUTE COUNT: 0.7 10*9/L (ref 0.3–0.8)
MONOCYTES RELATIVE PERCENT: 3.6 %
NEUTROPHILS ABSOLUTE COUNT: 18.7 10*9/L — ABNORMAL HIGH (ref 1.8–7.8)
NEUTROPHILS RELATIVE PERCENT: 93.7 %
PLATELET COUNT: 183 10*9/L (ref 150–450)
RED BLOOD CELL COUNT: 4.17 10*12/L — ABNORMAL LOW (ref 4.26–5.60)
RED CELL DISTRIBUTION WIDTH: 16 % — ABNORMAL HIGH (ref 12.2–15.2)
WBC ADJUSTED: 20 10*9/L — ABNORMAL HIGH (ref 3.6–11.2)

## 2021-03-07 LAB — BLOOD GAS, VENOUS
BASE EXCESS VENOUS: -4.1 — ABNORMAL LOW (ref -2.0–2.0)
HCO3 VENOUS: 20 mmol/L — ABNORMAL LOW (ref 22–27)
O2 SATURATION VENOUS: 79.8 % (ref 40.0–85.0)
PCO2 VENOUS: 36 mmHg — ABNORMAL LOW (ref 40–60)
PH VENOUS: 7.37 (ref 7.32–7.43)
PO2 VENOUS: 44 mmHg (ref 30–55)

## 2021-03-07 LAB — SLIDE REVIEW

## 2021-03-07 LAB — PHOSPHORUS: PHOSPHORUS: 4.5 mg/dL (ref 2.4–5.1)

## 2021-03-07 LAB — MAGNESIUM
MAGNESIUM: 1.7 mg/dL (ref 1.6–2.6)
MAGNESIUM: 2 mg/dL (ref 1.6–2.6)

## 2021-03-07 LAB — LACTATE, VENOUS, WHOLE BLOOD: LACTATE BLOOD VENOUS: 1.6 mmol/L (ref 0.5–1.8)

## 2021-03-07 MED ADMIN — ipratropium-albuteroL (DUO-NEB) 0.5-2.5 mg/3 mL nebulizer solution 3 mL: 3 mL | RESPIRATORY_TRACT | @ 22:00:00

## 2021-03-07 MED ADMIN — oxyCODONE (ROXICODONE) immediate release tablet 10 mg: 10 mg | ORAL | @ 20:00:00 | Stop: 2021-03-21

## 2021-03-07 MED ADMIN — aztreonam (AZACTAM) 2 g in sodium chloride 0.9 % (NS) 100 mL IVPB-connector bag: 2 g | INTRAVENOUS | @ 15:00:00 | Stop: 2021-03-13

## 2021-03-07 MED ADMIN — oxyCODONE (OxyCONTIN) 12 hr crush resistant ER/CR tablet 10 mg: 10 mg | ORAL | @ 13:00:00 | Stop: 2021-03-07

## 2021-03-07 MED ADMIN — heparin (porcine) 5,000 unit/mL injection 5,000 Units: 5000 [IU] | SUBCUTANEOUS | @ 19:00:00

## 2021-03-07 MED ADMIN — ipratropium-albuteroL (DUO-NEB) 0.5-2.5 mg/3 mL nebulizer solution 3 mL: 3 mL | RESPIRATORY_TRACT | @ 15:00:00

## 2021-03-07 MED ADMIN — heparin (porcine) 5,000 unit/mL injection 5,000 Units: 5000 [IU] | SUBCUTANEOUS | @ 11:00:00

## 2021-03-07 MED ADMIN — azithromycin (ZITHROMAX) 500 mg in sodium chloride (NS) 0.9 % 250 mL IVPB: 500 mg | INTRAVENOUS | @ 02:00:00 | Stop: 2021-03-09

## 2021-03-07 MED ADMIN — gabapentin (NEURONTIN) capsule 100 mg: 100 mg | ORAL | @ 19:00:00 | Stop: 2021-03-07

## 2021-03-07 MED ADMIN — valACYclovir (VALTREX) tablet 500 mg: 500 mg | ORAL | @ 13:00:00

## 2021-03-07 MED ADMIN — linezolid in dextrose 5% (ZYVOX) 600 mg/300 mL IVPB 600 mg: 600 mg | INTRAVENOUS | @ 13:00:00 | Stop: 2021-03-13

## 2021-03-07 MED ADMIN — gabapentin (NEURONTIN) capsule 100 mg: 100 mg | ORAL | @ 02:00:00

## 2021-03-07 MED ADMIN — gabapentin (NEURONTIN) capsule 100 mg: 100 mg | ORAL | @ 13:00:00 | Stop: 2021-03-07

## 2021-03-07 MED ADMIN — sulfamethoxazole-trimethoprim (BACTRIM DS) 800-160 mg tablet 160 mg of trimethoprim: 1 | ORAL | @ 02:00:00 | Stop: 2021-03-28

## 2021-03-07 MED ADMIN — oxyCODONE (ROXICODONE) immediate release tablet 5 mg: 5 mg | ORAL | @ 16:00:00 | Stop: 2021-03-07

## 2021-03-07 MED ADMIN — oxyCODONE (OxyCONTIN) 12 hr crush resistant ER/CR tablet 10 mg: 10 mg | ORAL | @ 02:00:00 | Stop: 2021-03-20

## 2021-03-07 MED ADMIN — aztreonam (AZACTAM) 2 g in sodium chloride 0.9 % (NS) 100 mL IVPB-connector bag: 2 g | INTRAVENOUS | @ 03:00:00 | Stop: 2021-03-13

## 2021-03-07 MED ADMIN — ipratropium-albuteroL (DUO-NEB) 0.5-2.5 mg/3 mL nebulizer solution 3 mL: 3 mL | RESPIRATORY_TRACT | @ 11:00:00

## 2021-03-07 MED ADMIN — azithromycin (ZITHROMAX) 500 mg in sodium chloride (NS) 0.9 % 250 mL IVPB: 500 mg | INTRAVENOUS | @ 18:00:00 | Stop: 2021-03-09

## 2021-03-07 MED ADMIN — sulfamethoxazole-trimethoprim (BACTRIM DS) 800-160 mg tablet 160 mg of trimethoprim: 1 | ORAL | @ 13:00:00 | Stop: 2021-03-28

## 2021-03-07 MED ADMIN — heparin (porcine) 5,000 unit/mL injection 5,000 Units: 5000 [IU] | SUBCUTANEOUS | @ 04:00:00

## 2021-03-07 MED ADMIN — linezolid in dextrose 5% (ZYVOX) 600 mg/300 mL IVPB 600 mg: 600 mg | INTRAVENOUS | @ 03:00:00 | Stop: 2021-03-13

## 2021-03-07 MED ADMIN — oseltamivir (TAMIFLU) capsule 30 mg: 30 mg | ORAL | @ 13:00:00

## 2021-03-07 MED ADMIN — magnesium sulfate 2gm/50mL IVPB: 2 g | INTRAVENOUS | @ 18:00:00 | Stop: 2021-03-07

## 2021-03-07 MED ADMIN — lactated ringers bolus 1,000 mL: 1000 mL | INTRAVENOUS | @ 02:00:00 | Stop: 2021-03-06

## 2021-03-07 MED ADMIN — acetaminophen (TYLENOL) tablet 650 mg: 650 mg | ORAL | @ 09:00:00

## 2021-03-07 MED ADMIN — oxyCODONE (ROXICODONE) immediate release tablet 5 mg: 5 mg | ORAL | @ 09:00:00 | Stop: 2021-03-07

## 2021-03-07 NOTE — Unmapped (Incomplete)
Patient AxOx4, drowsy, following commands.

## 2021-03-07 NOTE — Unmapped (Signed)
Arterial Line Insertion Procedure Note     Date of Service: 03/07/2021    Patient Name:: Adam Keith  Patient MRN: [id number]    Indications: Respiratory failure    Procedure Details:   Informed consent was obtained after explanation of the risks and benefits of the procedure, refer to the consent documentation.      Time-out was performed immediately prior to the procedure to verify correct patient, procedure, site, positioning, and special equipment if applicable.    Freida Busman???s test was performed to ensure adequate perfusion. The patient???s right wrist was prepped and draped in sterile fashion. 1% Lidocaine was used to anesthetize the area. A 20 G Arrow line was introduced into the radial artery with ultrasound guidance. The catheter was threaded over the guide wire and the needle was removed with appropriate pulsatile blood return. The catheter was then sutured in place to the skin and a sterile CHG dressing applied. Perfusion to the extremity distal to the point of catheter insertion was checked and found to be adequate.  A pressure transducer was connected sterilely to the arterial line and an arterial line waveform was noted on the monitor.     Estimated Blood Loss: 0 ml    Condition:  The patient tolerated the procedure well and remains in the same condition as pre-procedure.    Complications:  The patient tolerated the procedure well and there were no complications.    Plan:  Monitor     Esther Hardy, MD

## 2021-03-07 NOTE — Unmapped (Signed)
IMMUNOCOMPROMISED HOST INFECTIOUS DISEASE PROGRESS NOTE      Adam Keith is being seen in consultation at the request of [name], MD for evaluation of pneumonia.    Assessment/Recommendations:    Adam 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 krusei fungemia  - Prior chemotherapy:??GRAAPPH-2005??induction??with dasatinib??(vincristine, dexamethasone and dasatinib);??C1D1 01/24/2020; difficulty tolerating single-agent dasatinib  - Current chemotherapy:??blinatumomab (anti-CD19/CD3) + ponatinib (TKI)??s/p??C4 on??12/14/20; 01/2021 now on ponatinib monotherapy maintenance  -??01/11/21:??Normocellular bone marrow (30% overall) with trilineage hematopoiesis and less than 1% blasts by manual aspirate differential;??Flow cytometry MRD analysis reveals no definitive immunophenotypic evidence of residual B lymphoblastic leukemia;??BCR-ABL p210 transcripts were detected at a level of 0.002 IS % ratio in bone marrow.    # Hypogammaglobulimia  - 11/01/20 IgG 266 declined IVIG  - 03/06/21 IgG 148    # AKI  Cr up to 3 from b/l ~1.6-1.8, improved with fluids.  ??  Pertinent Co-morbidities  # COPD/empysema  #??DIHS/DRESS??ceftaroline??06/12/2020, relapse??after cefepime??11/02/2020  - per prior ID notes possible culprits ceftaroline,??posaconazole, dasatinib, sotrovimab  -??12/14/20??discontinued??steroids  ????  Pertinent Exposure History??  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  Infection History  Active infections:??  # Influenza infection with secondary bacterial pneumonia 03/05/21  - 02/28/21 Fever and respiratory symptoms   - 12/2 : RPP - Flu +ve  - 12/2 : CXR - Right Lower Lobe airspace disease  - 12/3: CXR worsened opacities  Tx : Linezolid /Oseltamivir---> Linezolid/aztreonam/flagyl/azithromycin/oseltamivir 12/3 --> linezolid/aztreonam/osteltamivir 12/3 -->    Prior infections:  #MRSA??RLL PNA c/b bacteremia 11/01/2020, probable right empyema 8/12/202; presumed relapsed RML MRSA pneumonia 11/18/2020  - 8/19 Failed??thoracocentesis by IP  - 9/26 CT chest parenchyma without e/o infection, persistent but decreased loculated effusions on right  - s/p MRSA antibiotics until 01/04/2021  #Persistent right chest pain 12/29/20; Low grade fever, weakness 01/28/21  - 02/02/2021 - Dr. 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  Antimicrobial Intolerance/allergy  Cefepime - DRESS  Ceftaroline -??DRESS  Dapsone - possible??agranulocytosis, per chart anaphylaxis  Vancomycin - probable ototoxicity (in combination??with furosemide)  Isavuconazole - elevated LFTs in the setting of TKI     RECOMMENDATIONS    Diagnostic  ??? Please send sputum for cx when able, for aerobic/anaerobic cx  ??? Follow-up BCx 12/3   ??? IgG low at 148    Monitoring for antimicrobial toxicities  ??? While on antibiotics, monitor CBC with diff and CMP at least weekly    Treatment  ??? CONTINUE linezolid 500 mg bid  ??? CONTINUE aztreonam for empiric gram-negative coverage  ??? CONTINUE oseltamivir 75 mg bid x 5 days (12/1 - 12/5)  ??? STOP azithromycin  ???  Declined IVIG given prior bad reaction    Prophylaxis  ??? Cont valacyclovir, bactrim          The [facility] ID service will continue to follow.  Please page the ID Transplant/Liquid Oncology Fellow consult at [phone] with questions.        Interval HPI:      Source of information includes:  Electronic Medical Records.  History obtained from:EMR   Interval events since last encounter: NAEON. Remains on HFNC, increased to 60%/100L. Intermittently febrile, tmax 38.6. Pressures otherwise stable.  Today pt reports to me/ROS: Cough now productive; min change in SOB. Pain with cough.    Medications:   Antimicrobials:  Linezolid 12/2 ->  Oseltamivir 12/2 ->  Aztreonam 12/3 ->  Azithromycin 12/3 ->    Metronidazole 12/3 x 1    Ppx: TMP/SMX, valacyclovir      Current/Prior immunomodulators:  ponatinib 30mg  monotherapy (held)    Other medications reviewed.          Vital Signs last 24 hours:  Temp:  [36.5 ??C (97.7 ??F)-38.6 ??C (101.5 ??F)] 38.6 ??C (101.5 ??F)  Heart Rate:  [108-130] 108  SpO2 Pulse:  [107-129] 107  Resp:  [15-41] 24  BP: (91-130)/(46-79) 116/63  MAP (mmHg):  [61-97] 80  FiO2 (%):  [40 %-100 %] 50 %  SpO2:  [84 %-100 %] 92 %    Physical Exam:   Patient Lines/Drains/Airways Status     Active Active Lines, Drains, & Airways     Name Placement date Placement time Site Days    Peripheral IV 03/06/21 Left Arm 03/06/21  --  Arm  1    Peripheral IV 03/06/21 Anterior;Right Forearm 03/06/21  2030  Forearm  less than 1                Const [x]  vital signs above    []  NAD, non-toxic appearance []  Chronically ill-appearing, non-distressed  Ill-appearing,       Eyes [x]  Lids normal bilaterally, conjunctiva anicteric and noninjected OU     [x] PERRL  [] EOMI  Ill- appearing      ENMT [x]  Normal appearance of external nose and ears, no nasal discharge        []  MMM, no lesions on lips or gums [x]  No thrush, leukoplakia, oral lesions  [x]  Dentition good []  Edentulous []  Dental caries present  [x]  Hearing normal  [x]  TMs with good light reflexes bilaterally   HFNC in place      Neck [x]  Neck of normal appearance and trachea midline        []  No thyromegaly, nodules, or tenderness   []  Full neck ROM        Lymph []  No LAD in neck     []  No LAD in supraclavicular area     []  No LAD in axillae   []  No LAD in epitrochlear chains     []  No LAD in inguinal areas        CV [x]  RRR            [x]  No peripheral edema     []  Pedal pulses intact   [x]  No abnormal heart sounds appreciated   [x]  Extremities WWP         Resp []  Normal WOB at rest    []  No breathlessness with speaking, no coughing  []  CTA anteriorly    []  CTA posteriorly    Diffuse rhonchi throughout anteriorally, mild increased work of breathing  GI [x]  Normal inspection, NTND   [x]  NABS     []  No umbilical hernia on exam       []  No hepatosplenomegaly     []  Inspection of perineal and perianal areas normal        GU [x]  Normal external genitalia     [] No urinary catheter present in urethra   []  No CVA tenderness    []  No tenderness over renal allograft        MSK [x]  No clubbing or cyanosis of hands       []  No vertebral point tenderness  []  No focal tenderness or abnormalities on palpation of joints in RUE, LUE, RLE, or LLE        Skin [x]  No rashes, lesions, or ulcers of visualized skin     []  Skin warm and dry to palpation         Neuro [x]  Face expression symmetric  []  Sensation to light touch grossly intact throughout    []  Moves extremities equally    [x]  No tremor noted        []  CNs II-XII grossly intact     []  DTRs normal and symmetric throughout []  Gait unremarkable        Psych [x]  Appropriate affect       [x]  Fluent speech         []  Attentive, good eye contact  [x]  Oriented to person, place, time          []  Judgment and insight are appropriate           Data for Medical Decision Making     Recent Labs   Lab Units 03/07/21  0619 03/07/21  0332 03/07/21  0027 03/06/21  1426   WBC 10*9/L  --  20.0*  --   --    HEMOGLOBIN g/dL  --  16.1*  --   --    PLATELET COUNT (1) 10*9/L  --  183  --   --    NEUTRO ABS 10*9/L  --  18.7*  --   --    LYMPHO ABS 10*9/L  --  0.5*  --   --    EOSINO ABS 10*9/L  --  0.0  --   --    SODIUM WHOLE BLOOD mmol/L 135  --    < >  --    SODIUM mmol/L  --  135  --  137   POTASSIUM WHOLE BLOOD mmol/L 4.2  --    < >  --    POTASSIUM mmol/L  --  4.5  --  3.8   BUN mg/dL  --  35*  --  24*   CREATININE mg/dL  --  0.96*  --  0.45*   GLUCOSE mg/dL  --  94  --  99   CALCIUM mg/dL  --  8.2*  --  6.9*   MAGNESIUM mg/dL  --  1.7  --  2.0   PHOSPHORUS mg/dL  --  4.5  --   --    BILIRUBIN TOTAL mg/dL  --  0.4  --   --    AST U/L  --  9  --   --    ALT U/L  --  10  --   --    IGG mg/dL  --   --   --  409*    < > = values in this interval not displayed.     I reviewed and noted the  following labs: persistent leukocytosis at 20. Hgb/plt slightly decreased. Cr elevated from baseline, stable from yesterday    Microbiology:  Past cultures were reviewed in Epic and CareEverywhere.  Review of cultures with microbiology: NA    BCx 12/3 x2: pending  UCx 122/3: pending    Imaging:    XR Chest 12/4: Slight increase in nonspecific left basilar opacities. Patchy and confluent heterogeneous opacities in the right upper lung are similar.      XR Chest Portable 03/06/2021    Narrative  EXAM: XR CHEST PORTABLE  DATE: 03/06/2021 10:06 AM  ACCESSION: [id number]  DICTATED: 03/06/2021 10:23 AM  INTERPRETATION LOCATION: [facility]    CLINICAL INDICATION: 39 years old Male with PNEUMONIA    COMPARISON: 03/05/2021    TECHNIQUE: Portable Chest Radiograph.    FINDINGS:    Marked interval worsening of airspace consolidation in the right upper and midlung zones. The left lung is clear.    No pleural effusion or pneumothorax.    Stable cardiomediastinal silhouette.    Impression  1. Marked interval worsening of right lung consolidation.       I have reviewed the images from 03/07/21 and agree with the findings.    Independent visualization of images: I independently reviewed the image from (date) and I agree with the findings/interpretation.    Additional Studies:   (12/3) EKG QTc 427

## 2021-03-07 NOTE — Unmapped (Signed)
MICU Nightshift Note     Date of Service: 03/06/2021    Principal Problem:    Acute respiratory failure with hypoxia (CMS-HCC)  Active Problems:    AKI (acute kidney injury) (CMS-HCC)    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)    Hypomagnesemia    Dyspnea    Moderate episode of recurrent major depressive disorder (CMS-HCC)    Fever    Immunocompromised (CMS-HCC)    Pneumonia    Low back pain    Sepsis (CMS-HCC)    Influenza A    Bacterial lobar pneumonia  Resolved Problems:    * No resolved hospital problems. *          Cross cover summary       NAEON.      Cephus Slater, MD

## 2021-03-07 NOTE — Unmapped (Signed)
MICU Daily Progress Note     Date of Service: 03/07/2021    Problem List:   Principal Problem:    Acute respiratory failure with hypoxia (CMS-HCC)  Active Problems:    AKI (acute kidney injury) (CMS-HCC)    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)    Hypomagnesemia    Dyspnea    Moderate episode of recurrent major depressive disorder (CMS-HCC)    Fever    Immunocompromised (CMS-HCC)    Pneumonia    Low back pain    Sepsis (CMS-HCC)    Influenza A    Bacterial lobar pneumonia  Resolved Problems:    * No resolved hospital problems. *      Interval history: Adam [name] with PMHx of ALL, COPD, prior MRSA bacteremia requiring intubation, MDD, lower back pain, tobacco use hx, presenting with nausea, vomiting, fever initially found to be hypotensive and tachycardic at oncologists office, started on linezolid. SOB worsened in subsequent 24hrs prompting presentation to the ED. Admitted to the MICU due to c/f acute hypoxic respiratory failure secondary to influenza infection with superimposed PNA.     24 Hr Intv: Overnight, desat to 80% requiring incr in FiO2 to 100% after profuse coughing fit. Was able to wean, but then became hypoxic again. At bedside in AM reported feeling better with improvement of cough. Later on, expressed sadness at situation and is unsure of desired Code Status.     Neurological   Chronic Pain - Peripheral neuropathy  Has chronic pain at baseline from ALL. [facility]. Reduced oxycodone doses in efforts to not worsen respiratory drive. Reduced home gabapentin secondary to present AKI.   - Cont home oxycodone 10mg  q 12hr SCH   - Cont home gabapentin 100mg  TID [facility]  - PRNs: oxy 5mg  q6hr, tylenol 650mg  q 6  - Hold home Amytriptyline given delirium risk, low threshold to restart  ??  MDD  Halved home klonopin to reduce efforts in worsening respiratory drive.  - Klonopin 0.25mg   - Holding nortriptyline as above  ??  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 Stable.   ??  Pulmonary   AHRF 2/2 Influenza w/ superimposed PNA   Saturating well on 40% FiO2, initially presented to oncologist office outpatient with hypotension, tachypnea, started on linezolid. Had worsening shortness of breath over the past 24hrs per wife at bedside, prompting presentation to the ED.   - Continue HFNC  - Continue Abx as indicated below   - Droplet Precautions   - Follow serial VBGs to ensure appropriately compensating for acidosis  ??  COPD  20 ppy tobacco use hx, followed by Pulmonology outpt. Per 01/25/21 no prior exacerbations. Utilizes home albuterol.   - O2 Goal 88-92%  - cont ANORO Ellipta  - Holding on steroids  - ABx as listed below  - prn duonebs  ??  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 Underwent unsuccessful thoracentesis, at the time referred to CT surgery, at the time didn't think warranted surgical intervention.   - CTM    Cardiovascular   Sinus tachycardia 2/2 Sepsis:  Initially with MAPs < 65, but improved s/p 2.5 L  LR prior to admission. Given additional 1L upon evaluation by primary team.  - 500cc LR additional bolus  - CTM  - Will monitor fluid status closely    Renal   AKI  Likely prerenal. Dry on exam. S/p  1L.  - trend lactate  - f/u BMP  - Monitor I&O  - Avoid nephrotoxins    Infectious Disease/Autoimmune   Influenza c/b PNA - Hx of MRSA Bacteremia  Leukocytosis with neutrophil predominance, febrile, myalgias. CXR reveals alveolar process covering the entire R side, with signifcant progression from 12/2 CXR. Empirically given Linezolid/Aztreonam in the ED. 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 See infectious disease note by Dr. Cardell Peach from 03/06/21 for list of prior infections & notable antimicrobial intolerance/allergy list.   - Infectious disease Immunocomp consulted, follow reccs.  - Continue Tamiflu (12/2-12/6)  - Continue Linezolid, Aztreonam (12/3-12/9)  - Continue Azithromycin, may discontinue once urine legionella negative per ID reccs  - follow up LRCx  - follow up BCx  - consider CT Chest to further eval lung pathology per Heme/Onc 12/4 reccs.  ??  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  - continue home Valtrex 500mg  daily  - continue home Bactrium (normally BID Sat-Sun)  - holding TKI per heme reccs see below    History of DRESS syndrome??2/2 cephalosporin  - consulted ICID for reccs re: abx regimen  - Avoid beta lactams    Cultures:  Blood Culture, Routine (no units)   Date Value   03/06/2021 No Growth at 24 hours   03/06/2021 No Growth at 24 hours     Urine Culture, Comprehensive (no units)   Date Value   03/06/2021 NO GROWTH     WBC (10*9/L)   Date Value   03/07/2021 20.0 (H)     WBC, UA (/HPF)   Date Value   03/06/2021 17 (H)            FEN/GI   Hypomagnesemia  Found to be 1.2 on admission, repleted and wnl on repeat check.  - Maintain value > 2.0  - CTM                Malnutrition Assessment: Not done yet.  Body mass index is 31.84 kg/m??.            Heme/Coag   Ph+ B-ALL:  Diagnosed 01/21/2020, seen by [facility]. 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 ??He subsequently had difficulty tolerating single agent dasatinib (as a bridge to planned ponatinib-blinatumomab--had fluid retention and pleural effusion). ??He??then completed????4 cycles??of ponatinib-blinatumomab, initiated for treatment of MRD in the setting of poor tolerance of standard therapy.??He is now MRD-negative with BCR-ABL level of 0.002% on marrow. Now on ponatinib monotherapy  - heme on board: Recc holding home TKI until malignant hematology evals for ready to resume. Patient's family will need update to bring in home supply then.    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 Now down to 148.  - readdress IVIG   ??  DVT PPx  - subcutaneous heparin 5000 units q 8hr    Endocrine   NAI    Integumentary   - WOCN consulted for high risk skin assessment No. Reason: Not indicated..  - cont pressure mitigating precautions per skin policy    Prophylaxis/LDA/Restraints/Consults   Can CVC be removed? N/A, no CVC present (including vascular catheter for HD or PLEX)   Can A-line be removed? N/A, no A-line present  Can Foley be removed? N/A, no Foley present  Mobility plan: Step 2 - Head of bed elevation (>60 degrees)    Feeding: Oral diet  Analgesia: Pain  adequately controlled  Sedation SAT/SBT: N/A  Thromboembolic ppx: SQ heparin  Head of bed >30 degrees: Yes  Ulcer ppx: Not indicated  Glucose within target range: Yes, in range    Does patient need/have an active type/screen? No    RASS at goal? N/A, not on sedation  [place] Agitation Assessment Scale (RASS) : 0 (03/07/2021  8:00 AM)     Can antipsychotics be stopped? N/A, not on antipsychotics  CAM-ICU Result: Negative (11/09/2020  9:00 PM)      Would hospice care be appropriate for this patient? No, patient improving or expected to improve  Any unaddressed hospice/palliative care needs? no    Patient Lines/Drains/Airways Status     Active Active Lines, Drains, & Airways     Name Placement date Placement time Site Days    Peripheral IV 03/06/21 Left Arm 03/06/21  --  Arm  1    Peripheral IV 03/06/21 Anterior;Right Forearm 03/06/21  2030  Forearm  less than 1              Patient Lines/Drains/Airways Status     Active Wounds     None                Goals of Care     Code Status: Full Code    Designated Healthcare Decision Maker:  Mr. Kimberley current decisional capacity for healthcare decision-making is Full capacity. His designated Educational psychologist) is/are   HCDM (patient stated preference): Jamil, [place] - Spouse - [phone].      Subjective     See 24hr intv.    Objective     Vitals - past 24 hours  Temp:  [36.5 ??C (97.7 ??F)-38.6 ??C (101.5 ??F)] 36.5 ??C (97.7 ??F)  Heart Rate:  [108-130] 117  SpO2 Pulse:  [107-129] 118  Resp:  [15-41] 28  BP: (92-136)/(46-79) 115/65  FiO2 (%):  [10 %-100 %] 100 %  SpO2:  [84 %-99 %] 97 % Intake/Output  I/O last 3 completed shifts:  In: 5090.4 [P.O.:300; I.V.:2060; IV Piggyback:2730.4]  Out: 1000 [Urine:1000]     Physical Exam:    General: Appears uncomfortable, shallow breathing.  HEENT: NCAT, reports no sinus tenderness. Moist mucus membranes.  CV: sinus tachycardia, no m/r/g. No clubbing  Pulm: Tachypnic, coarse crackles present in R > L.   GI: Abdomen distended, soft, non-tender. Bowel sounds present.  MSK: able to freely move each extremity. No rigidity. Diffuse myalgia. No LE swelling/size discrepancy.  Skin: No apparent rash. No evident bruising.   Neuro: AOx3, calm, mood normal.    Continuous Infusions:       Scheduled Medications:   ??? azithromycin  500 mg Intravenous Q24H   ??? aztreonam  2 g Intravenous Q12H   ??? gabapentin  100 mg Oral TID   ??? heparin (porcine) for subcutaneous use  5,000 Units Subcutaneous [facility]   ??? flu vacc qs2022-23 6mos up(PF)  0.5 mL Intramuscular During hospitalization   ??? ipratropium-albuteroL  3 mL Nebulization Q6H (RT)   ??? linezolid  600 mg Intravenous Q12H [facility]   ??? magnesium sulfate  2 g Intravenous Once   ??? oseltamivir  30 mg Oral BID   ??? oxyCODONE  10 mg Oral Q12H [facility]   ??? sulfamethoxazole-trimethoprim  1 tablet Oral 2 times per day on Sun Sat   ??? valACYclovir  500 mg Oral Daily       PRN medications:  acetaminophen, clonazePAM, ipratropium-albuteroL, oxyCODONE    Data/Imaging Review: Reviewed in Epic and  personally interpreted on 03/07/2021. See EMR for detailed results.    Regino Schultze, MD  [facility], PGY-1

## 2021-03-07 NOTE — Unmapped (Signed)
Malignant Hematology Consult Note    Requesting Attending Physician :  [name], MD  Service Requesting Consult : Medical ICU (MDI)  Reason for Consult: B-ALL  Primary Oncologist: Dr Senaida Ores    Assessment: Adam Keith is a 40 y.o. 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 Malignant hematology was consulted for evaluation of whether to continue TKI for B-ALL.     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 The course was complicated by septic shock, candida krusei fungemia, MRSA bacteremia with septic emboli c/b acute renal failure and respiratory distress requiring dialysis and intubation. ??Post-induction bmbx (day 29) demonstrated flow-based MRD-negative remission but low-level BCR-ABL persisted. ??He subsequently had difficulty tolerating single agent dasatinib (as a bridge to planned ponatinib-blinatumomab--had fluid retention and pleural effusion). ??He??then completed????4 cycles??of ponatinib-blinatumomab, initiated for treatment of MRD in the setting of poor tolerance of standard therapy.??He is now MRD-negative with BCR-ABL level of 0.002% on marrow. Patient is now on ponatinib 30mg  monotherapy (since 01/2021).    His respiratory status remains tenuous, currently on 75% FiO2 at 60L/min, briefly required 100% FiO2 early AM in 12/4.  In light of his current infection/hypoxic respiratory failure, would not restart ponatanib until he is more stable. Will need to discuss additional strategies as outpatient to prevent recurrent infections (he has refused IVIG infusions and continued to not want them when brought up today).    Recommendations:   -Appreciate excellent care of MICU and consultation from ICID.  -Continue to hold ponatinib for now. Malignant Hematology will evaluate about when to resume. (please ensure patient's family can bring in home supply when ready to resume in the hospital).  -Consider CT chest to further evaluate lung pathology    This patient has been staffed with Dr. Senaida Ores. These recommendations were discussed with the primary team.     Please contact the malignant hematology fellow at [phone] with any further questions.    [name], MD  Hematology-Oncology Fellow    -------------------------------------------------------------    HPI: Adam Keith is a 40 y.o. 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 Malignant hematology was consulted for evaluation of whether to continue TKI for B-ALL.     Seen in ID clinic 12/2 with fever/cough, positive for flu A, CXR concerning for pneumonia, sent home on linezolid. Re-presented to ED on 12/3 with worsening symptoms, including hypotension, hypoxia. WBC 20.6. Admitted to MICU. Overnight had improvement in lactate with IVFs (normalized, initially 4.1 on admission).  Febrile to 38.6 overnight.  Early this morning ~5am, had desaturation to 80% and required 100% FiO2 briefly, was able to be weaned to 75% FiO2 when our team saw him in MICU ~9:30 am.  6AM ABG 7.36/pO2 96/PCO2 35.    He reports significant congestion/cough/SOB but reports he overall feels improved compared to yesterday.  Continues to decline IVIG.    Review of Systems: All positive and pertinent negatives are noted in the HPI; otherwise all other systems are negative    Oncologic History:  Oncology History Overview Note   Referring/Local Oncologist: None    Diagnosis:Ph+ ALL    Genetics:    Karyotype/FISH:Abnormal Karyotype: 46,XY,t(9;22)(q34;q11.2)[1]/45,XY,der(7;9)(q10;q10)t(9;22)(q34;q11.2),der(22)t(9;22)[11]/46,sdl,+der(22)t(9;22)[5]/46,XY[3]     Abnormal FISH: A BCR/ABL1 interphase FISH assay shows an abnormal signal pattern  in 97% of the 100 cells scored. 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  The findings support a diagnosis of leukemia and have implications for targeted therapy and for monitoring residual disease.      Molecular Genetics:  BCR-ABL1 p210 transcripts were detected at a level of 46.479 IS% ratio in bone marrow.  BCR-ABL1 p190 transcripts were detected at a level of 4 in 100,000 cells in bone marrow.    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)     01/24/2020 - 04/02/2020 Chemotherapy    IP/OP LEUKEMIA GRAAPH-2005 + RITUXIMAB < 60 YO  rituximab hypercvad (odd and even course)     02/24/2020 Remission    CR with PCR MRD+; marrow showing 70% blasts with <1% blasts, negative flow MRD, BCR-ABL p210 PCR 0.197%; CNS negative for blasts     03/09/2020 Progression    CSF - rare blast ID'ed - IT chemo given    03/13/20 - IT chemo, CSF negative     04/16/2020 Biopsy    BM Bx with 40-50% cellularity, <1% blasts, MRD flow cytometry negative, BCR-ABL p210 transcripts 0.036%     05/28/2020 - 05/28/2020 Chemotherapy    OP AML - CNS THERAPY (INTRATHECAL CYTARABINE, INTRATHECAL METHOTREXATE, OR INTRATHECAL TRIPLE)  Select one of the following: cytarabine IT 100 mg with hydrocortisone 50 mg, cytarabine IT 40 mg with methotrexate 15 mg with hydrocortisone 50 mg, OR methotrexate IT 12 mg with hydrocortisone 50 mg     06/19/2020 -  Chemotherapy    IP/OP LEUKEMIA BLINATUMOMAB 7-DAY INFUSION (MINIMAL RESIDUAL DISEASE; WT >= 22 KG) (HOME INFUSION)      Cycles 1*-4: Blinatumomab 28 mcg/day Days 1-28 of 6-week cycle.  *Given in the inpatient setting on Days 1-3 on Cycle 1 and Days 1-2 on Cycle 2, while other treatment days are given in the outpatient setting.    Cycle 1 MRD Dosing     07/18/2020 Adverse Reaction    Hospitalization: fevers. Pneumonia     07/23/2020 Biopsy    Diagnosis  Bone marrow, right iliac, aspiration and biopsy  -   Normocellular bone marrow (50%) with trilineage hematopoiesis and 1% blasts by manual aspirate differential  -   Flow cytometry MRD analysis reveals no definitive immunophenotypic evidence of residual B lymphoblastic leukemia   - BCR-ABL p210 0.006%         08/10/2020 -  Chemotherapy    Cycle 2 blinatumomab-ponatinib  Ponatinib 30mg     1 IT per cycle     09/21/2020 Adverse Reaction    Covid-19, symptomatic but not requiring hospitalization.     10/06/2020 -  Chemotherapy    Cycle 3 blinatumomab-ponatinib  Ponatinib 30mg        11/01/2020 Adverse Reaction    Hospitalization: MRSA Pneumonia requiring intubation    Blinatumomab stopped.  Ponatinib held until he stabilized.       12/14/2020 -  Chemotherapy    Cycle 4 blinatumomab 28 mcg/day days 1-28, ponatinib 30 mg per day IT triple therapy day 29     ALL (acute lymphoblastic leukemia) (CMS-HCC)   06/11/2020 -  Chemotherapy    IP/OP LEUKEMIA BLINATUMOMAB 7-DAY INFUSION (MINIMAL RESIDUAL  DISEASE; WT >= 22 KG) (HOME INFUSION)  Cycles 1*-4: Blinatumomab 28 mcg/day Days 1-28 of 6-week cycle.  *Given in the inpatient setting on Days 1-3 on Cycle 1 and Days 1-2 on Cycle 2, while other treatment days are given in the outpatient setting.     09/21/2020 Initial Diagnosis    ALL (acute lymphoblastic leukemia) (CMS-HCC)         Past Medical History:   Diagnosis Date   ??? 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 BONE MARROW BIOPSY & ASPIRATION  01/21/2020        ??? CHG [place], CHEST,REAL TIME  11/20/2020    Procedure: ULTRASOUND, CHEST, REAL TIME WITH IMAGE DOCUMENTATION;  Surgeon: Jerelyn Charles, MD;  Location: BRON[facility] PROCEDURE LAB [facility];  Service: Pulmonary   ??? IR INSERT PORT AGE GREATER THAN 5 YRS  03/10/2020    IR INSERT PORT AGE GREATER THAN 5 YRS 03/10/2020 [name], MD IMG VIR [facility]   ??? PR BRONCHOSCOPY,DIAGNOSTIC W LAVAGE Bilateral 07/20/2020    Procedure: BRONCHOSCOPY, RIGID OR FLEXIBLE, INCLUDE FLUOROSCOPIC GUIDANCE WHEN PERFORMED; W/BRONCHIAL ALVEOLAR LAVAGE WITH MODERATE SEDATION;  Surgeon: Dellis Filbert, MD;  Location: BRON[facility] PROCEDURE LAB [facility];  Service: Pulmonary       Family History   Problem Relation Age of Onset   ??? Melanoma Neg Hx    ??? Basal cell carcinoma Neg Hx    ??? Squamous cell carcinoma Neg Hx         Social History     Socioeconomic History   ??? Marital status: Married   Tobacco Use   ??? Smoking status: Former     Packs/day: 2.00     Types: Cigarettes     Quit date: 01/20/2020     Years since quitting: 1.1   ??? Smokeless tobacco: Former     Types: Financial planner   ??? Vaping Use: Never used   Other Topics Concern   ??? Do you use sunscreen? No   ??? Tanning bed use? No   ??? Are you easily burned? Yes   ??? Excessive sun exposure? No   ??? Blistering sunburns? Yes     Social Determinants of Health     Financial Resource Strain: Low Risk    ??? Difficulty of Paying Living Expenses: Not very hard   Food Insecurity: No Food Insecurity   ??? Worried About Running Out of Food in the Last Year: Never true   ??? Ran Out of Food in the Last Year: Never true   Transportation Needs: No Transportation Needs   ??? Lack of Transportation (Medical): No   ??? Lack of Transportation (Non-Medical): No       Social History     Social History Narrative   ??? Not on file       Allergies: is allergic to bupropion hcl, cefepime, ceftaroline fosamil, dapsone, onion, vancomycin analogues, bismuth subsalicylate, and furosemide.    Medications:   Meds:  ??? azithromycin  500 mg Intravenous Q24H   ??? aztreonam  2 g Intravenous Q12H   ??? DAPTOmycin  4 mg/kg Intravenous Q24H   ??? gabapentin  100 mg Oral TID   ??? heparin (porcine) for subcutaneous use 5,000 Units Subcutaneous Q8H [facility]   ??? ipratropium-albuteroL  3 mL Nebulization Q6H (RT)   ??? linezolid  600 mg Intravenous Q12H [facility]   ???  oseltamivir  30 mg Oral BID   ??? oxyCODONE  10 mg Oral Q12H [facility]   ??? sulfamethoxazole-trimethoprim  1 tablet Oral 2 times per day on Sun Sat   ??? valACYclovir  500 mg Oral Daily     Continuous Infusions:  PRN Meds:.acetaminophen, clonazePAM, ipratropium-albuteroL, oxyCODONE    Objective:   Vitals: Temp:  [36.5 ??C (97.7 ??F)-38.6 ??C (101.5 ??F)] 36.5 ??C (97.7 ??F)  Heart Rate:  [108-130] 114  SpO2 Pulse:  [107-129] 114  Resp:  [15-41] 22  BP: (91-136)/(46-79) 136/60  MAP (mmHg):  [61-97] 84  FiO2 (%):  [40 %-100 %] 100 %  SpO2:  [84 %-99 %] 89 %    Physical Exam:  BP 104/52  - Pulse 112  - Temp 36.5 ??C (97.7 ??F) (Oral)  - Resp 22  - SpO2 96%    General appearance - Nontoxic appearing but continued shallow breathing  Mental status - alert, oriented to person, place, and time   Eyes - pupils equal and reactive, extraocular eye movements intact   Nose - normal and patent, no erythema, discharge or polyps   Mouth - mucous membranes moist, pharynx normal without lesions   Neck - supple   Lymphatics - not examined  Pulmonary - On HFNC 75%  Cardiovascular- Sinus tachycardia on monitor  Gastrointestinal - Obese abdomen, distended, nontender.  Neurological - alert, oriented, normal speech, no focal findings or movement disorder noted   Musculoskeletal - no joint tenderness, deformity or swelling   Extremities - peripheral pulses normal, no pedal edema, no clubbing or cyanosis   Skin - normal coloration and turgor, no rashes, no suspicious skin lesions noted       Test Results  Recent Labs     03/06/21  0935 03/07/21  0332   WBC 20.6* 20.0*   NEUTROABS 18.7* 18.7*   HGB 13.3 12.0*   PLT 179 183       Imaging: Radiology studies were personally reviewed - Large consolidation of RUL, worse compared to 12/3.

## 2021-03-08 LAB — CBC W/ AUTO DIFF
BASOPHILS ABSOLUTE COUNT: 0 10*9/L (ref 0.0–0.1)
BASOPHILS RELATIVE PERCENT: 0.2 %
EOSINOPHILS ABSOLUTE COUNT: 0.1 10*9/L (ref 0.0–0.5)
EOSINOPHILS RELATIVE PERCENT: 0.6 %
HEMATOCRIT: 33.1 % — ABNORMAL LOW (ref 39.0–48.0)
HEMOGLOBIN: 11.1 g/dL — ABNORMAL LOW (ref 12.9–16.5)
LYMPHOCYTES ABSOLUTE COUNT: 0.5 10*9/L — ABNORMAL LOW (ref 1.1–3.6)
LYMPHOCYTES RELATIVE PERCENT: 2.6 %
MEAN CORPUSCULAR HEMOGLOBIN CONC: 33.6 g/dL (ref 32.0–36.0)
MEAN CORPUSCULAR HEMOGLOBIN: 28.9 pg (ref 25.9–32.4)
MEAN CORPUSCULAR VOLUME: 86 fL (ref 77.6–95.7)
MEAN PLATELET VOLUME: 8.4 fL (ref 6.8–10.7)
MONOCYTES ABSOLUTE COUNT: 1.1 10*9/L — ABNORMAL HIGH (ref 0.3–0.8)
MONOCYTES RELATIVE PERCENT: 5.5 %
NEUTROPHILS ABSOLUTE COUNT: 17.5 10*9/L — ABNORMAL HIGH (ref 1.8–7.8)
NEUTROPHILS RELATIVE PERCENT: 91.1 %
PLATELET COUNT: 209 10*9/L (ref 150–450)
RED BLOOD CELL COUNT: 3.85 10*12/L — ABNORMAL LOW (ref 4.26–5.60)
RED CELL DISTRIBUTION WIDTH: 16.7 % — ABNORMAL HIGH (ref 12.2–15.2)
WBC ADJUSTED: 19.2 10*9/L — ABNORMAL HIGH (ref 3.6–11.2)

## 2021-03-08 LAB — BASIC METABOLIC PANEL
ANION GAP: 6 mmol/L (ref 5–14)
ANION GAP: 8 mmol/L (ref 5–14)
BLOOD UREA NITROGEN: 33 mg/dL — ABNORMAL HIGH (ref 9–23)
BLOOD UREA NITROGEN: 35 mg/dL — ABNORMAL HIGH (ref 9–23)
BUN / CREAT RATIO: 14
BUN / CREAT RATIO: 13
CALCIUM: 8.1 mg/dL — ABNORMAL LOW (ref 8.7–10.4)
CALCIUM: 8.1 mg/dL — ABNORMAL LOW (ref 8.7–10.4)
CHLORIDE: 101 mmol/L (ref 98–107)
CHLORIDE: 102 mmol/L (ref 98–107)
CO2: 24 mmol/L (ref 20.0–31.0)
CO2: 22 mmol/L (ref 20.0–31.0)
CREATININE: 2.44 mg/dL — ABNORMAL HIGH
CREATININE: 2.69 mg/dL — ABNORMAL HIGH
EGFR CKD-EPI (2021) MALE: 33 mL/min/{1.73_m2} — ABNORMAL LOW (ref >=60)
EGFR CKD-EPI (2021) MALE: 30 mL/min/{1.73_m2} — ABNORMAL LOW (ref >=60)
GLUCOSE RANDOM: 123 mg/dL (ref 70–179)
GLUCOSE RANDOM: 97 mg/dL (ref 70–99)
POTASSIUM: 5 mmol/L — ABNORMAL HIGH (ref 3.4–4.8)
POTASSIUM: 4.1 mmol/L (ref 3.4–4.8)
SODIUM: 131 mmol/L — ABNORMAL LOW (ref 135–145)
SODIUM: 132 mmol/L — ABNORMAL LOW (ref 135–145)

## 2021-03-08 LAB — BLOOD GAS CRITICAL CARE PANEL, ARTERIAL
BASE EXCESS ARTERIAL: -4.5 — ABNORMAL LOW (ref -2.0–2.0)
BASE EXCESS ARTERIAL: -6 — ABNORMAL LOW (ref -2.0–2.0)
CALCIUM IONIZED ARTERIAL (MG/DL): 4.68 mg/dL (ref 4.40–5.40)
CALCIUM IONIZED ARTERIAL (MG/DL): 4.36 mg/dL — ABNORMAL LOW (ref 4.40–5.40)
FIO2 ARTERIAL: 80
GLUCOSE WHOLE BLOOD: 137 mg/dL (ref 70–179)
GLUCOSE WHOLE BLOOD: 118 mg/dL (ref 70–179)
HCO3 ARTERIAL: 23 mmol/L (ref 22–27)
HCO3 ARTERIAL: 19 mmol/L — ABNORMAL LOW (ref 22–27)
HEMOGLOBIN BLOOD GAS: 12 g/dL — ABNORMAL LOW
HEMOGLOBIN BLOOD GAS: 11 g/dL — ABNORMAL LOW
LACTATE BLOOD ARTERIAL: 0.7 mmol/L (ref <1.3)
LACTATE BLOOD ARTERIAL: 0.9 mmol/L (ref <1.3)
O2 SATURATION ARTERIAL: 91.1 % — ABNORMAL LOW (ref 94.0–100.0)
O2 SATURATION ARTERIAL: 96.8 % (ref 94.0–100.0)
PCO2 ARTERIAL: 67 mmHg (ref 35.0–45.0)
PCO2 ARTERIAL: 41.7 mmHg (ref 35.0–45.0)
PH ARTERIAL: 7.16 — CL (ref 7.35–7.45)
PH ARTERIAL: 7.29 — ABNORMAL LOW (ref 7.35–7.45)
PO2 ARTERIAL: 73 mmHg — ABNORMAL LOW (ref 80.0–110.0)
PO2 ARTERIAL: 85.3 mmHg (ref 80.0–110.0)
POTASSIUM WHOLE BLOOD: 4.7 mmol/L — ABNORMAL HIGH (ref 3.4–4.6)
POTASSIUM WHOLE BLOOD: 4.5 mmol/L (ref 3.4–4.6)
SODIUM WHOLE BLOOD: 130 mmol/L — ABNORMAL LOW (ref 135–145)
SODIUM WHOLE BLOOD: 131 mmol/L — ABNORMAL LOW (ref 135–145)

## 2021-03-08 LAB — MAGNESIUM
MAGNESIUM: 2.4 mg/dL (ref 1.6–2.6)
MAGNESIUM: 2.4 mg/dL (ref 1.6–2.6)

## 2021-03-08 LAB — BLOOD GAS, ARTERIAL
BASE EXCESS ARTERIAL: -4.1 — ABNORMAL LOW (ref -2.0–2.0)
BASE EXCESS ARTERIAL: -4.4 — ABNORMAL LOW (ref -2.0–2.0)
BASE EXCESS ARTERIAL: -5.1 — ABNORMAL LOW (ref -2.0–2.0)
HCO3 ARTERIAL: 21 mmol/L — ABNORMAL LOW (ref 22–27)
HCO3 ARTERIAL: 20 mmol/L — ABNORMAL LOW (ref 22–27)
HCO3 ARTERIAL: 21 mmol/L — ABNORMAL LOW (ref 22–27)
O2 SATURATION ARTERIAL: 97.2 % (ref 94.0–100.0)
O2 SATURATION ARTERIAL: 94.6 % (ref 94.0–100.0)
O2 SATURATION ARTERIAL: 97 % (ref 94.0–100.0)
PCO2 ARTERIAL: 39.4 mmHg (ref 35.0–45.0)
PCO2 ARTERIAL: 38.2 mmHg (ref 35.0–45.0)
PCO2 ARTERIAL: 41.2 mmHg (ref 35.0–45.0)
PH ARTERIAL: 7.34 — ABNORMAL LOW (ref 7.35–7.45)
PH ARTERIAL: 7.34 — ABNORMAL LOW (ref 7.35–7.45)
PH ARTERIAL: 7.31 — ABNORMAL LOW (ref 7.35–7.45)
PO2 ARTERIAL: 94.1 mmHg (ref 80.0–110.0)
PO2 ARTERIAL: 74.2 mmHg — ABNORMAL LOW (ref 80.0–110.0)
PO2 ARTERIAL: 88.8 mmHg (ref 80.0–110.0)

## 2021-03-08 LAB — PHOSPHORUS: PHOSPHORUS: 6.3 mg/dL — ABNORMAL HIGH (ref 2.4–5.1)

## 2021-03-08 LAB — TRIGLYCERIDES: TRIGLYCERIDES: 951 mg/dL — ABNORMAL HIGH (ref 0–150)

## 2021-03-08 LAB — HEPATIC FUNCTION PANEL
ALBUMIN: 2.4 g/dL — ABNORMAL LOW (ref 3.4–5.0)
ALKALINE PHOSPHATASE: 114 U/L (ref 46–116)
ALT (SGPT): 11 U/L (ref 10–49)
AST (SGOT): <8 U/L (ref <=34)
BILIRUBIN DIRECT: 0.1 mg/dL (ref 0.00–0.30)
BILIRUBIN TOTAL: 0.2 mg/dL — ABNORMAL LOW (ref 0.3–1.2)
PROTEIN TOTAL: 5.9 g/dL (ref 5.7–8.2)

## 2021-03-08 LAB — LEGIONELLA ANTIGEN, URINE: LEGIONELLA, URINARY ANTIGEN: NEGATIVE

## 2021-03-08 MED ADMIN — midazolam in sodium chloride 0.9% (1 mg/mL) infusion PMB: 0-10 mg/h | INTRAVENOUS | @ 03:00:00 | Stop: 2021-03-07

## 2021-03-08 MED ADMIN — heparin (porcine) 5,000 unit/mL injection 5,000 Units: 5000 [IU] | SUBCUTANEOUS | @ 19:00:00

## 2021-03-08 MED ADMIN — gabapentin (NEURONTIN) capsule 300 mg: 300 mg | ORAL | @ 14:00:00

## 2021-03-08 MED ADMIN — melatonin tablet 3 mg: 3 mg | GASTROENTERAL | @ 23:00:00

## 2021-03-08 MED ADMIN — lactated ringers bolus 1,000 mL: 1000 mL | INTRAVENOUS | @ 04:00:00

## 2021-03-08 MED ADMIN — linezolid in dextrose 5% (ZYVOX) 600 mg/300 mL IVPB 600 mg: 600 mg | INTRAVENOUS | @ 14:00:00 | Stop: 2021-03-13

## 2021-03-08 MED ADMIN — propofol (DIPRIVAN) infusion 10 mg/mL: 0-50 ug/kg/min | INTRAVENOUS | @ 10:00:00 | Stop: 2021-03-08

## 2021-03-08 MED ADMIN — acetaminophen (TYLENOL) tablet 650 mg: 650 mg | ORAL | @ 02:00:00

## 2021-03-08 MED ADMIN — midazolam in sodium chloride 0.9% (1 mg/mL) infusion PMB: 0-10 mg/h | INTRAVENOUS | @ 05:00:00 | Stop: 2021-03-08

## 2021-03-08 MED ADMIN — clonazePAM (KlonoPIN) disintegrating tablet 0.5 mg: .5 mg | GASTROENTERAL | @ 16:00:00

## 2021-03-08 MED ADMIN — propofol (DIPRIVAN) infusion 10 mg/mL: 0-50 ug/kg/min | INTRAVENOUS | @ 22:00:00 | Stop: 2021-03-08

## 2021-03-08 MED ADMIN — norepinephrine bitartrate-D5W 8 mg/250 mL (32 mcg/mL) infusion: INTRAVENOUS | @ 05:00:00 | Stop: 2021-03-07

## 2021-03-08 MED ADMIN — VECuronium (NORCURON) 10 mg injection: INTRAVENOUS | @ 08:00:00 | Stop: 2021-03-08

## 2021-03-08 MED ADMIN — propofoL (DIPRIVAN) injection: INTRAVENOUS | @ 02:00:00

## 2021-03-08 MED ADMIN — heparin (porcine) 5,000 unit/mL injection 5,000 Units: 5000 [IU] | SUBCUTANEOUS | @ 10:00:00

## 2021-03-08 MED ADMIN — midazolam (VERSED) injection 2 mg: 2 mg | INTRAVENOUS | @ 03:00:00 | Stop: 2021-03-07

## 2021-03-08 MED ADMIN — acetaminophen (TYLENOL) tablet 650 mg: 650 mg | ORAL | @ 12:00:00

## 2021-03-08 MED ADMIN — aztreonam (AZACTAM) 2 g in sodium chloride 0.9 % (NS) 100 mL IVPB-connector bag: 2 g | INTRAVENOUS | @ 14:00:00 | Stop: 2021-03-13

## 2021-03-08 MED ADMIN — valACYclovir (VALTREX) tablet 500 mg: 500 mg | ORAL | @ 14:00:00

## 2021-03-08 MED ADMIN — VECuronium (NORCURON) injection 10 mg: 10 mg | INTRAVENOUS | @ 08:00:00 | Stop: 2021-03-08

## 2021-03-08 MED ADMIN — aztreonam (AZACTAM) 2 g in sodium chloride 0.9 % (NS) 100 mL IVPB-connector bag: 2 g | INTRAVENOUS | @ 02:00:00 | Stop: 2021-03-13

## 2021-03-08 MED ADMIN — HYDROmorphone 1 mg/mL in 0.9% sodium chloride: 0-10 mg/h | INTRAVENOUS | @ 20:00:00 | Stop: 2021-03-21

## 2021-03-08 MED ADMIN — propofol (DIPRIVAN) infusion 10 mg/mL: 0-50 ug/kg/min | INTRAVENOUS | @ 18:00:00 | Stop: 2021-03-08

## 2021-03-08 MED ADMIN — propofol (DIPRIVAN) infusion 10 mg/mL: 0-50 ug/kg/min | INTRAVENOUS | @ 07:00:00 | Stop: 2021-03-08

## 2021-03-08 MED ADMIN — oseltamivir (TAMIFLU) capsule 30 mg: 30 mg | ORAL | @ 01:00:00

## 2021-03-08 MED ADMIN — succinylcholine (ANECTINE) injection: INTRAVENOUS | @ 02:00:00

## 2021-03-08 MED ADMIN — oxyCODONE (ROXICODONE) immediate release tablet 10 mg: 10 mg | ORAL | @ 20:00:00 | Stop: 2021-03-21

## 2021-03-08 MED ADMIN — propofol (DIPRIVAN) infusion 10 mg/mL: 0-80 ug/kg/min | INTRAVENOUS | @ 05:00:00

## 2021-03-08 MED ADMIN — ipratropium-albuteroL (DUO-NEB) 0.5-2.5 mg/3 mL nebulizer solution 3 mL: 3 mL | RESPIRATORY_TRACT | @ 01:00:00

## 2021-03-08 MED ADMIN — norepinephrine 8 mg in dextrose 5 % 250 mL (32 mcg/mL) infusion PMB: 0-30 ug/min | INTRAVENOUS | @ 05:00:00

## 2021-03-08 MED ADMIN — oxyCODONE (ROXICODONE) immediate release tablet 10 mg: 10 mg | ORAL | @ 05:00:00 | Stop: 2021-03-21

## 2021-03-08 MED ADMIN — linezolid in dextrose 5% (ZYVOX) 600 mg/300 mL IVPB 600 mg: 600 mg | INTRAVENOUS | @ 02:00:00 | Stop: 2021-03-13

## 2021-03-08 MED ADMIN — ipratropium-albuteroL (DUO-NEB) 0.5-2.5 mg/3 mL nebulizer solution 3 mL: 3 mL | RESPIRATORY_TRACT | @ 15:00:00

## 2021-03-08 MED ADMIN — gabapentin (NEURONTIN) capsule 300 mg: 300 mg | ORAL | @ 19:00:00

## 2021-03-08 MED ADMIN — HYDROmorphone (PF) (DILAUDID) injection 1 mg: 1 mg | INTRAVENOUS | @ 10:00:00 | Stop: 2021-03-08

## 2021-03-08 MED ADMIN — propofol (DIPRIVAN) infusion 10 mg/mL: 0-50 ug/kg/min | INTRAVENOUS | @ 14:00:00 | Stop: 2021-03-08

## 2021-03-08 MED ADMIN — acetaminophen (TYLENOL) tablet 650 mg: 650 mg | ORAL | @ 20:00:00

## 2021-03-08 MED ADMIN — sulfamethoxazole-trimethoprim (BACTRIM DS) 800-160 mg tablet 160 mg of trimethoprim: 1 | ORAL | @ 01:00:00 | Stop: 2021-03-28

## 2021-03-08 MED ADMIN — gabapentin (NEURONTIN) capsule 300 mg: 300 mg | ORAL | @ 01:00:00

## 2021-03-08 MED ADMIN — ipratropium-albuteroL (DUO-NEB) 0.5-2.5 mg/3 mL nebulizer solution 3 mL: 3 mL | RESPIRATORY_TRACT | @ 21:00:00

## 2021-03-08 MED ADMIN — HYDROmorphone (PF) (DILAUDID) injection 1 mg: 1 mg | INTRAVENOUS | @ 03:00:00 | Stop: 2021-03-07

## 2021-03-08 MED ADMIN — famotidine (PEPCID) tablet 20 mg: 20 mg | GASTROENTERAL | @ 16:00:00

## 2021-03-08 MED ADMIN — HYDROmorphone (PF) (DILAUDID) 1 mg/mL injection: @ 03:00:00 | Stop: 2021-03-07

## 2021-03-08 MED ADMIN — propofol (DIPRIVAN) infusion 10 mg/mL: 0-80 ug/kg/min | INTRAVENOUS | @ 03:00:00

## 2021-03-08 MED ADMIN — oseltamivir (TAMIFLU) capsule 30 mg: 30 mg | ORAL | @ 14:00:00 | Stop: 2021-03-10

## 2021-03-08 MED ADMIN — oxyCODONE (ROXICODONE) immediate release tablet 10 mg: 10 mg | ORAL | @ 12:00:00 | Stop: 2021-03-21

## 2021-03-08 MED ADMIN — heparin (porcine) 5,000 unit/mL injection 5,000 Units: 5000 [IU] | SUBCUTANEOUS | @ 05:00:00

## 2021-03-08 MED ADMIN — HYDROmorphone 1 mg/mL in 0.9% sodium chloride: 0-10 mg/h | INTRAVENOUS | @ 10:00:00 | Stop: 2021-03-21

## 2021-03-08 NOTE — Unmapped (Signed)
Patient remained free from falls/injury. Monitoring VS, om HFNC at 90%/60L.  Did not tolerate bipap, remained afebrile. Bed low and locked. No complaints expressed at this time, will continue to monitor.   Q2 hour turns/skin care completed and documented.      Problem: Adult Inpatient Plan of Care  Goal: Plan of Care Review  Outcome: Ongoing - Unchanged  Goal: Patient-Specific Goal (Individualized)  Outcome: Ongoing - Unchanged  Goal: Absence of Hospital-Acquired Illness or Injury  Outcome: Ongoing - Unchanged  Intervention: Identify and Manage Fall Risk  Recent Flowsheet Documentation  Taken 03/07/2021 0800 by Dalbert Garnet, RN  Safety Interventions:   aspiration precautions   bed alarm   commode/urinal/bedpan at bedside   environmental modification   fall reduction program maintained   infection management   isolation precautions   lighting adjusted for tasks/safety   nonskid shoes/slippers when out of bed   room near unit station   toileting scheduled  Intervention: Prevent Infection  Recent Flowsheet Documentation  Taken 03/07/2021 0800 by Dalbert Garnet, RN  Infection Prevention:   cohorting utilized   environmental surveillance performed   equipment surfaces disinfected   hand hygiene promoted   personal protective equipment utilized   rest/sleep promoted   single patient room provided   visitors restricted/screened  Goal: Optimal Comfort and Wellbeing  Outcome: Ongoing - Unchanged  Goal: Readiness for Transition of Care  Outcome: Ongoing - Unchanged  Goal: Rounds/Family Conference  Outcome: Ongoing - Unchanged     Problem: Infection  Goal: Absence of Infection Signs and Symptoms  Outcome: Ongoing - Unchanged  Intervention: Prevent or Manage Infection  Recent Flowsheet Documentation  Taken 03/07/2021 0800 by Dalbert Garnet, RN  Infection Management:   aseptic technique maintained   cultures obtained and sent to lab  Isolation Precautions: droplet precautions maintained     Problem: Skin Injury Risk Increased  Goal: Skin Health and Integrity  Outcome: Ongoing - Unchanged  Intervention: Optimize Skin Protection  Recent Flowsheet Documentation  Taken 03/07/2021 1800 by Dalbert Garnet, RN  Pressure Reduction Techniques: frequent weight shift encouraged  Taken 03/07/2021 1600 by Dalbert Garnet, RN  Head of Bed [facility]) Positioning: HOB at 45 degrees  Taken 03/07/2021 1200 by Dalbert Garnet, RN  Pressure Reduction Techniques:   frequent weight shift encouraged   heels elevated off bed  Head of Bed [facility]) Positioning: HOB at 45 degrees  Taken 03/07/2021 1000 by Dalbert Garnet, RN  Pressure Reduction Techniques: frequent weight shift encouraged  Taken 03/07/2021 0800 by Dalbert Garnet, RN  Pressure Reduction Techniques:   frequent weight shift encouraged   heels elevated off bed   weight shift assistance provided  Head of Bed (HOB) Positioning: HOB at 30-45 degrees     Problem: Fluid Imbalance (Pneumonia)  Goal: Fluid Balance  Outcome: Ongoing - Unchanged     Problem: Infection (Pneumonia)  Goal: Resolution of Infection Signs and Symptoms  Outcome: Ongoing - Unchanged  Intervention: Prevent Infection Progression  Recent Flowsheet Documentation  Taken 03/07/2021 0800 by Dalbert Garnet, RN  Infection Management:   aseptic technique maintained   cultures obtained and sent to lab  Isolation Precautions: droplet precautions maintained     Problem: Respiratory Compromise (Pneumonia)  Goal: Effective Oxygenation and Ventilation  Outcome: Ongoing - Unchanged  Intervention: Optimize Oxygenation and Ventilation  Recent Flowsheet Documentation  Taken 03/07/2021 1600 by Dalbert Garnet, RN  Head of Bed [facility]) Positioning: HOB at 45 degrees  Taken  03/07/2021 1200 by Dalbert Garnet, RN  Head of Bed [facility]) Positioning: HOB at 45 degrees  Taken 03/07/2021 0800 by Dalbert Garnet, RN  Head of Bed [facility]) Positioning: HOB at 30-45 degrees     Problem: Pain Chronic (Persistent) (Comorbidity Management)  Goal: Acceptable Pain Control and Functional Ability  Outcome: Ongoing - Unchanged     Problem: Self-Care Deficit  Goal: Improved Ability to Complete Activities of Daily Living  Outcome: Ongoing - Unchanged

## 2021-03-08 NOTE — Unmapped (Signed)
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 *      Interval history: Adam [name] with PMHx of ALL, COPD, prior MRSA bacteremia requiring intubation, MDD, lower back pain, tobacco use hx, presenting with nausea, vomiting, fever initially found to be hypotensive and tachycardic at oncologists office, started on linezolid. SOB worsened in subsequent 24hrs prompting presentation to the ED. Admitted to the MICU due to c/f acute hypoxic respiratory failure secondary to influenza infection with superimposed PNA.     24 Hr Intv: Overnight, desat to 70% requiring intubation. ETT, OGT, CVC placed. LRCx reveals 1+ gram + cocci. High tidal volumes despite sedation, s/p vecuronium x1. Proned on VC/AC FiO2 80%. Recurrence and persistence of fever. MRSA Screen positive. Will follow up Nutrition reccs for TF.     Neurological   Sedation while intubated  Intubated secondary to worsening hypoxia, s/p vecuronium x1.  - propofol gtt  - wean versed  - pain plan per below    Chronic Pain - Peripheral neuropathy  Has chronic pain at baseline from ALL. Follows with [facility]. Reduced oxycodone doses in efforts to not worsen respiratory drive. Reduced home gabapentin secondary to present AKI.   - Cont home oxycodone 10mg  q 12hr SCH   - Cont home gabapentin 300mg  TID [facility]  - New SCH: dilaudid gtt 1mg   - PRNs: dilaudid 2mg , tylenol 650mg  q 6  - Hold home Amytriptyline given delirium risk, low threshold to restart  ??  MDD  Halved home klonopin to reduce efforts in worsening respiratory drive.  - Klonopin 0.5mg  Enterally BID  - Holding nortriptyline as above  ??  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 Stable.   ??  Pulmonary   AHRF 2/2 Influenza w/ superimposed PNA - s/p Intubation  Saturating well on 40% FiO2, initially presented to oncologist office outpatient with hypotension, tachypnea, started on linezolid. Had worsening shortness of breath over the past 24hrs per wife at bedside, prompting presentation to the ED. S/p intubation 12/5 overnight. P/F ratio less than 150 currently, will assess at 16 hours. c/f ARDS given acute onset, bilateral infiltrates, low P/F ratio, no pulm edema.   - Continue VC/AC, continue proning  - Continue Abx as indicated below   - Droplet Precautions   - Follow serial VBGs to ensure appropriately compensating for acidosis  - Consider CT chest if acute worsening of respiratory status, but otherwise imaging would not change current management.  ??  COPD  20 ppy tobacco use hx, followed by Pulmonology outpt. Per 01/25/21 no prior exacerbations. Utilizes home albuterol.   - O2 Goal 88-92%  - cont ANORO Ellipta  - Holding on steroids  - ABx as listed below  - prn duonebs  ??  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 Underwent  unsuccessful thoracentesis, at the time referred to CT surgery, at the time didn't think warranted surgical intervention. Futility of POC bedside US of lungs discussed, may offer little change to clinical evaluation. Can consider further imaging if acute worsening of respiratory status.  - CTM    Cardiovascular   Sinus tachycardia 2/2 Sepsis:  - continue to wean norepi     Renal   AKI  Likely prerenal. Dry on exam. S/p 1L.  - trend lactate  - f/u BMP  - Monitor I&O  - Avoid nephrotoxins    Infectious Disease/Autoimmune   Influenza c/b PNA - Hx of MRSA Bacteremia  Leukocytosis with neutrophil predominance, febrile, myalgias. CXR reveals alveolar process covering the entire R side, with signifcant progression from 12/2 CXR. Empirically given Linezolid/Aztreonam in the ED. 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 See infectious disease note by Dr. Cardell Peach from 03/06/21 for list of prior infections & notable antimicrobial intolerance/allergy list. MRSA screen positive, though BCx remain negative. S/p Azithromycin 12/3-12/5, stopped given negative urine legionella.   - Infectious disease Immunocomp consulted, follow reccs.  - Continue Tamiflu (12/3-12/7)  - Continue Linezolid, Aztreonam (12/3-12/9)  - follow up LRCx: 1+ gram + cocci  - follow up BCx: Neg at 48hrs  - consider CT Chest to further eval lung pathology per Heme/Onc 12/4 reccs  - peridex while intubated  - May gain diagnostic clarity, but not stable enough to obtain. Will BAL samples for cx for aerobic/anaerobic cx , fungal and AFB cx when able  ??  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  - continue home Valtrex 500mg  daily  - continue home Bactrium (normally BID Sat-Sun)  - holding TKI per heme reccs see below    History of DRESS syndrome??2/2 cephalosporin  - consulted ICID for reccs re: abx regimen  - Avoid beta lactams    Cultures:  Blood Culture, Routine (no units)   Date Value   03/06/2021 No Growth at 24 hours   03/06/2021 No Growth at 24 hours     Urine Culture, Comprehensive (no units)   Date Value   03/06/2021 NO GROWTH     WBC (10*9/L)   Date Value   03/08/2021 19.2 (H)     WBC, UA (/HPF)   Date Value   03/06/2021 17 (H)        FEN/GI   Hypomagnesemia  Found to be 1.2 on admission, repleted and wnl on repeat check.  - Maintain value > 2.0  - CTM    Hyponatremia - Hyperkalemic to 131 and 5.0 respectively on labs today.   - CTM                Malnutrition Assessment:   Body mass index is 32.05 kg/m??.  - Vital High Protein and goal is 65ml/hr while he is on propofol. - Reach out to nutrition if his hyperkalemia worsens as TFs get to goal to switch formulas    Heme/Coag   Ph+ B-ALL:  Diagnosed 01/21/2020, seen by [facility]. 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 ??He subsequently had difficulty tolerating single agent dasatinib (as a bridge to planned ponatinib-blinatumomab--had fluid retention and pleural effusion). ??He??then completed????4 cycles??of ponatinib-blinatumomab, initiated for treatment of MRD in the setting of poor tolerance of standard therapy.??He is now MRD-negative with BCR-ABL level of 0.002% on marrow. Now on ponatinib monotherapy  - heme on board: Recc  holding home TKI until malignant hematology evals for ready to resume. Patient's family will need update to bring in home supply then.    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 Now down to 148.  - readdress IVIG   ??  DVT PPx  - subcutaneous heparin 5000 units q 8hr    Endocrine   NAI    Integumentary   - WOCN consulted for high risk skin assessment No. Reason: Not indicated..  - cont pressure mitigating precautions per skin policy    Prophylaxis/LDA/Restraints/Consults   Can CVC be removed? N/A, no CVC present (including vascular catheter for HD or PLEX)   Can A-line be removed? N/A, no A-line present  Can Foley be removed? N/A, no Foley present  Mobility plan: Step 2 - Head of bed elevation (>60 degrees)    Feeding: Oral diet  Analgesia: Pain adequately controlled  Sedation SAT/SBT: N/A  Thromboembolic ppx: SQ heparin  Head of bed >30 degrees: Yes  Ulcer ppx: Not indicated  Glucose within target range: Yes, in range    Does patient need/have an active type/screen? No    RASS at goal? N/A, not on sedation  [place] Agitation Assessment Scale (RASS) : -5 (03/08/2021  6:00 AM)     Can antipsychotics be stopped? N/A, not on antipsychotics  CAM-ICU Result: Negative (11/09/2020  9:00 PM)      Would hospice care be appropriate for this patient? No, patient improving or expected to improve  Any unaddressed hospice/palliative care needs? no    Patient Lines/Drains/Airways Status     Active Active Lines, Drains, & Airways     Name Placement date Placement time Site Days    ETT  7.5 03/07/21  2131  -- less than 1    CVC Triple Lumen 03/08/21 Non-tunneled Right Internal jugular 03/08/21  0132  Internal jugular  less than 1    NG/OG Tube Feedings Center mouth 03/08/21  0106  Center mouth  less than 1    Urethral Catheter 03/07/21  2200  --  less than 1    Peripheral IV 03/06/21 Left Arm 03/06/21  --  Arm  2    Peripheral IV 03/06/21 Anterior;Right Forearm 03/06/21  2030  Forearm  1    Peripheral IV 03/08/21 Right Antecubital 03/08/21  0102  Antecubital  less than 1    Arterial Line 03/07/21 Right Radial 03/07/21  1500  Radial  less than 1              Patient Lines/Drains/Airways Status     Active Wounds     None                Goals of Care     Code Status: Full Code    Designated Healthcare Decision Maker:  Mr. Strohecker current decisional capacity for healthcare decision-making is Full capacity. His designated Educational psychologist) is/are   HCDM (patient stated preference): [place], Warshawsky - Spouse - [phone].      Subjective     See 24hr intv.    Objective     Vitals - past 24 hours  Temp:  [36.5 ??C (97.7 ??F)-38.4 ??C (101.1 ??F)] 38.4 ??C (101.1 ??F)  Heart Rate:  [86-126] 105  SpO2 Pulse:  [86-126] 105  Resp:  [9-30] 28  BP: (104-136)/(52-74) 125/63  FiO2 (%):  [10 %-100 %] 80 %  SpO2:  [87 %-98 %] 97 % Intake/Output  I/O last 3 completed shifts:  In: 4545.8 [P.O.:1120; I.V.:575.8; NG/GT:90;  IV Piggyback:2760]  Out: 2800 [Urine:2800]     Physical Exam:    General: Appears uncomfortable, shallow breathing.  HEENT: NCAT, reports no sinus tenderness. Moist mucus membranes.  CV: sinus tachycardia, no m/r/g. No clubbing  Pulm: Tachypnic, coarse crackles present in R > L.   GI: Abdomen distended, soft, non-tender. Bowel sounds present.  MSK: able to freely move each extremity. No rigidity. Diffuse myalgia. No LE swelling/size discrepancy.  Skin: No apparent rash. No evident bruising.   Neuro: AOx3, calm, mood normal.    Continuous Infusions:   ??? HYDROmorphone 5 mg/hr (03/08/21 0503)   ??? midazolam (1 mg/mL) infusion 5 mg/hr (03/08/21 0030)   ??? norepinephrine bitartrate-NS 6 mcg/min (03/08/21 0400)   ??? propofol 10 mg/mL infusion 40 mcg/kg/min (03/08/21 0504)       Scheduled Medications:   ??? azithromycin  500 mg Intravenous Q24H   ??? aztreonam  2 g Intravenous Q12H   ??? gabapentin  300 mg Oral TID   ??? heparin (porcine) for subcutaneous use  5,000 Units Subcutaneous [facility]   ??? flu vacc qs2022-23 6mos up(PF)  0.5 mL Intramuscular During hospitalization   ??? ipratropium-albuteroL  3 mL Nebulization Q6H (RT)   ??? linezolid  600 mg Intravenous Q12H [facility]   ??? oseltamivir  30 mg Oral BID   ??? oxyCODONE  10 mg Oral Q8H   ??? ROCuronium  70 mg Intravenous Once   ??? sulfamethoxazole-trimethoprim  1 tablet Oral 2 times per day on Sun Sat   ??? valACYclovir  500 mg Oral Daily   ??? VECuronium           PRN medications:  acetaminophen, clonazePAM, HYDROmorphone, ipratropium-albuteroL, midazolam    Data/Imaging Review: Reviewed in Epic and personally interpreted on 03/08/2021. See EMR for detailed results.    Regino Schultze, MD  [facility], PGY-1

## 2021-03-08 NOTE — Unmapped (Signed)
Patient remains on HFNC 60L 100 %.  He was compliant with all breathing treatments. He was trialed on BiPAP 10/5 but refused to wear after 15 minutes. Patient states he will try to self prone.  Will continue to monitor.

## 2021-03-08 NOTE — Unmapped (Signed)
Central Venous Catheter Insertion Procedure Note (CPT [id number] and [id number])    Pre-procedural Planning     Patient Name:: Adam Keith  Patient MRN: [id number]    Line type:  Triple Lumen    Indications:  Medications requiring central access    Known Bleeding Diathesis: Patient/caregiver denies any known bleeding or platelet disorder.     Antiplatelet Agents: This patient is on an antiplatelet agent.    Systemic Anticoagulation: This patient is not on full systemic anticoagulation.    Significant Labs:  INR   Date Value Ref Range Status   12/01/2020 0.84  Final     PT   Date Value Ref Range Status   12/01/2020 9.8 (L) 10.3 - 13.4 sec Final     APTT   Date Value Ref Range Status   04/15/2020 38.0 (H) 24.9 - 36.9 sec Final     Platelet   Date Value Ref Range Status   03/07/2021 183 150 - 450 10*9/L Final       Consent: Informed consent was obtained after explanation of the risks (including arterial injury, pneumothorax, infection, and bleeding) and benefits of the procedure. Refer to the consent documentation.    Procedure Details     Time-out was performed immediately prior to the procedure.    The right internal jugular vein was identified using bedside ultrasound. This area was prepped and draped in the usual sterile fashion. Maximum sterile technique was used including antiseptics, cap, gloves, gown, hand hygiene, mask, and sterile sheet.  The patient was placed in Trendelenburg position. Local anesthesia with 1% lidocaine was applied subcutaneously then deep to the skin. The angiocath was then inserted into the internal jugular vein using ultrasound guidance. The angiocatheter placement was confirmed by manometry and the wire was imaged by ultrasound and noted to be properly positioned in the vein prior to dilation.    Using the Seldinger Technique a Triple Lumen was placed with each port easily flushed and freely drawing venous blood.    The catheter was secured with sutures, CHG dressings applied over the site.    Condition     The patient tolerated the procedure well and remains in the same condition as pre-procedure.    Complications and Recommendations     Complications:  None; patient tolerated the procedure well.    Plan:  CXR was ordered to verify catheter positioning.      Requesting Service: Medical ICU (MDI)    Time Requested: 12:50   Time Completed: 1:50  Comments: Patient tolerated the procedure well    Resident(s) Performing Procedure: Delaine Lame  Resident Year: PGY1

## 2021-03-08 NOTE — Unmapped (Signed)
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 *          Cross cover summary       - Desatting to 70s on 100%, intubated (propofol, dilaudid gtt, versed gtt)  - Inserted OG tube  - Lower Resp Cx with +1 Gram + cocci  - Gave 1L bolus LR  - Placed central line  - high tidal volumes despite sedation, gave one-time dose of vecuronium      Cephus Slater, MD

## 2021-03-08 NOTE — Unmapped (Signed)
CVAD Liaison - Insertion Note      The CVAD Liaison was contacted for the insertion of Central Venous Access Device (CVAD).  A chart review performed.   Indication: Medications requiring central line    Prior to the start of the procedure, a time out was performed and the identity of the patient was confirmed via name, medical record number and date of birth.  The sterile field was prepared with necessary supplies and equipment verified.  Insertion site was prepped with chlorhexidine solution and allowed to dry.  Maximum sterile techniques was utilized.    CVAD was inserted by Delaine Lame MD and Rick Duff.  Catheter was aspirated and flushed.  Insertion site cleansed, and sterile dressing applied per manufacturer guidelines.  The Central Line Checklist was referenced.  CVAD Liaison was present during entire procedure.  Report of the procedure given to the Primary Nurse.      Thank you for this consult,  Ivery Quale RN, CVAD Liaison     Consult Time 60 minutes (min)

## 2021-03-08 NOTE — Unmapped (Addendum)
Endotracheal Intubation     Indication for endotracheal intubation: Acute hypoxic respiratory failure (O2 sats low 80s on HFNC 100% 60L and NRB), increased WOB    A time-out was completed verifying correct patient, procedure, positioning, and special equipment if applicable. The patient was placed in a flat position.    Preoxygenation: HFNC 100% 60L and NRB   RSI: Yes  Sedation: Propofol 100 mg  Paralytic: succinylcholine 100 mg  Equipment: Glidescope S4  Size of Tube: 7.5   Cormack-Lehane View Grade: 1  Number of attempts: 1  Colorimetric change was visualized on capnometer. Breath sounds were heard in both lung fields equally.         ETT location confirmed by by auscultation and ETCO2 monitor.  Tube Secured Position: 24 at teeth    The patient tolerated the procedure well and there were no complications. A chest x-ray was ordered to assess for pneumothorax and verify endotracheal tube placement. Started on LTVV.    Lezlie Lye, MD and Theophilus Bones, MD from anesthesia were at bedside during the procedure in case of complications.     [name], MD  03/07/2021 22:17

## 2021-03-08 NOTE — Unmapped (Signed)
Respiratory Safety Check    Date: 03/08/2021     PPV Device at bedside:   [x]  Yes  []  Replaced  []  N/A    PPV mask at bedside:  [x]  Yes  []  Replaced  []  N/A    Checked humidifier, inspiratory limb, and expiratory limb:   [x]  Yes  []  Replaced  []  N/A    Backup trachs and supplies available at bedside:  []  Yes  []  Replaced  [x]  N/A    Documented trach airway form visible at bedside:   []  Yes  []  Replaced  [x]  N/A    iNO connected with PPV device:  []  Yes  []  Replaced  [x]  N/A

## 2021-03-08 NOTE — Unmapped (Signed)
Patient initially AxOx4 at start of shift and drowsy. Quickly began decompensating from a respiratory standpoint. Extremely labored and maxed out on HFNC w/ non-rebreather. MD called to bedside and decision was made to intubate. Patient now requiring high levels of sedation to maintain vent synchrony. See MAR for titrations. Pt paralyzed x1 and proned based on ABGs. See results for more information. Remains NS-ST and on low doses of levo for MAP>65. Febrile in the AM- tylenol given. OG tube placed for meds. Foley placed- good urine output. Central line placed. Wife at bedside and updated throughout shift.   Problem: Adult Inpatient Plan of Care  Goal: Plan of Care Review  Outcome: Ongoing - Unchanged  Goal: Patient-Specific Goal (Individualized)  Outcome: Ongoing - Unchanged  Goal: Absence of Hospital-Acquired Illness or Injury  Outcome: Ongoing - Unchanged  Intervention: Identify and Manage Fall Risk  Recent Flowsheet Documentation  Taken 03/08/2021 0600 by Ocie Cornfield, RN  Safety Interventions:   aspiration precautions   bleeding precautions  Taken 03/08/2021 0400 by Ocie Cornfield, RN  Safety Interventions:   aspiration precautions   family at bedside  Taken 03/08/2021 0000 by Ocie Cornfield, RN  Safety Interventions:   aspiration precautions   family at bedside  Taken 03/07/2021 2200 by Ocie Cornfield, RN  Safety Interventions: aspiration precautions  Taken 03/07/2021 2000 by Ocie Cornfield, RN  Safety Interventions: aspiration precautions  Intervention: Prevent and Manage VTE (Venous Thromboembolism) Risk  Recent Flowsheet Documentation  Taken 03/08/2021 0600 by Ocie Cornfield, RN  Activity Management: bedrest  Taken 03/08/2021 0400 by Ocie Cornfield, RN  Activity Management: bedrest  Taken 03/08/2021 0000 by Ocie Cornfield, RN  Activity Management: bedrest  Taken 03/07/2021 2200 by Ocie Cornfield, RN  Activity Management: bedrest  Taken 03/07/2021 2000 by Ocie Cornfield, RN  Activity Management: bedrest  Intervention: Prevent Infection  Recent Flowsheet Documentation  Taken 03/08/2021 0600 by Ocie Cornfield, RN  Infection Prevention:   environmental surveillance performed   equipment surfaces disinfected   hand hygiene promoted   personal protective equipment utilized   rest/sleep promoted   single patient room provided  Taken 03/08/2021 0400 by Ocie Cornfield, RN  Infection Prevention:   environmental surveillance performed   equipment surfaces disinfected   hand hygiene promoted   personal protective equipment utilized   single patient room provided   rest/sleep promoted  Taken 03/08/2021 0000 by Ocie Cornfield, RN  Infection Prevention:   rest/sleep promoted   single patient room provided  Taken 03/07/2021 2200 by Ocie Cornfield, RN  Infection Prevention:   equipment surfaces disinfected   personal protective equipment utilized   rest/sleep promoted   single patient room provided  Taken 03/07/2021 2000 by Ocie Cornfield, RN  Infection Prevention:   environmental surveillance performed   equipment surfaces disinfected   hand hygiene promoted   personal protective equipment utilized  Goal: Optimal Comfort and Wellbeing  Outcome: Ongoing - Unchanged  Goal: Readiness for Transition of Care  Outcome: Ongoing - Unchanged  Goal: Rounds/Family Conference  Outcome: Ongoing - Unchanged     Problem: Infection  Goal: Absence of Infection Signs and Symptoms  Outcome: Ongoing - Unchanged  Intervention: Prevent or Manage Infection  Recent Flowsheet Documentation  Taken 03/08/2021 0600 by Ocie Cornfield, RN  Infection Management: aseptic technique maintained  Isolation Precautions: spec airborne/contact precautions maintained  Taken 03/08/2021 0400 by Ocie Cornfield, RN  Infection Management: aseptic technique maintained  Isolation Precautions: spec airborne/contact precautions maintained  Taken 03/08/2021 0000 by Ocie Cornfield, RN  Infection  Management: aseptic technique maintained  Isolation Precautions: droplet precautions maintained  Taken 03/07/2021 2200 by Ocie Cornfield, RN  Infection Management: aseptic technique maintained  Isolation Precautions: droplet precautions maintained  Taken 03/07/2021 2000 by Ocie Cornfield, RN  Infection Management: aseptic technique maintained  Isolation Precautions: droplet precautions maintained     Problem: Skin Injury Risk Increased  Goal: Skin Health and Integrity  Outcome: Ongoing - Unchanged  Intervention: Optimize Skin Protection  Recent Flowsheet Documentation  Taken 03/08/2021 0600 by Ocie Cornfield, RN  Pressure Reduction Techniques: weight shift assistance provided  Head of Bed (HOB) Positioning: HOB at 30-45 degrees  Pressure Reduction Devices: positioning supports utilized  Taken 03/08/2021 0400 by Ocie Cornfield, RN  Pressure Reduction Techniques: weight shift assistance provided  Head of Bed (HOB) Positioning: HOB at 30-45 degrees  Pressure Reduction Devices: positioning supports utilized  Taken 03/08/2021 0000 by Ocie Cornfield, RN  Pressure Reduction Techniques:   frequent weight shift encouraged   weight shift assistance provided  Head of Bed [facility]) Positioning: HOB at 30-45 degrees  Taken 03/07/2021 2200 by Ocie Cornfield, RN  Head of Bed [facility]) Positioning: HOB at 30-45 degrees  Taken 03/07/2021 2000 by Ocie Cornfield, RN  Pressure Reduction Techniques: weight shift assistance provided  Head of Bed (HOB) Positioning: HOB at 30-45 degrees     Problem: Fluid Imbalance (Pneumonia)  Goal: Fluid Balance  Outcome: Ongoing - Unchanged     Problem: Infection (Pneumonia)  Goal: Resolution of Infection Signs and Symptoms  Outcome: Ongoing - Unchanged  Intervention: Prevent Infection Progression  Recent Flowsheet Documentation  Taken 03/08/2021 0600 by Ocie Cornfield, RN  Infection Management: aseptic technique maintained  Isolation Precautions: spec airborne/contact precautions maintained  Taken 03/08/2021 0400 by Ocie Cornfield, RN  Infection Management: aseptic technique maintained  Isolation Precautions: spec airborne/contact precautions maintained  Taken 03/08/2021 0000 by Ocie Cornfield, RN  Infection Management: aseptic technique maintained  Isolation Precautions: droplet precautions maintained  Taken 03/07/2021 2200 by Ocie Cornfield, RN  Infection Management: aseptic technique maintained  Isolation Precautions: droplet precautions maintained  Taken 03/07/2021 2000 by Ocie Cornfield, RN  Infection Management: aseptic technique maintained  Isolation Precautions: droplet precautions maintained     Problem: Respiratory Compromise (Pneumonia)  Goal: Effective Oxygenation and Ventilation  Outcome: Ongoing - Unchanged  Intervention: Optimize Oxygenation and Ventilation  Recent Flowsheet Documentation  Taken 03/08/2021 0600 by Ocie Cornfield, RN  Head of Bed [facility]) Positioning: HOB at 30-45 degrees  Taken 03/08/2021 0400 by Ocie Cornfield, RN  Head of Bed [facility]) Positioning: HOB at 30-45 degrees  Taken 03/08/2021 0000 by Ocie Cornfield, RN  Head of Bed [facility]) Positioning: HOB at 30-45 degrees  Taken 03/07/2021 2200 by Ocie Cornfield, RN  Head of Bed [facility]) Positioning: HOB at 30-45 degrees  Taken 03/07/2021 2000 by Ocie Cornfield, RN  Head of Bed [facility]) Positioning: HOB at 30-45 degrees     Problem: Pain Chronic (Persistent) (Comorbidity Management)  Goal: Acceptable Pain Control and Functional Ability  Outcome: Ongoing - Unchanged     Problem: Self-Care Deficit  Goal: Improved Ability to Complete Activities of Daily Living  Outcome: Ongoing - Unchanged     Problem: Non-Violent Restraints  Goal: Patient will remain free of restraint events  Outcome: Ongoing - Unchanged  Goal: Patient will remain free of physical injury  Outcome: Ongoing - Unchanged     Problem: Communication Impairment (Mechanical Ventilation, Invasive)  Goal: Effective Communication  Outcome: Ongoing - Unchanged     Problem: Device-Related Complication Risk (Mechanical Ventilation, Invasive)  Goal: Optimal Device Function  Outcome: Ongoing - Unchanged  Intervention: Optimize Device Care and Function  Recent Flowsheet Documentation  Taken 03/08/2021 0600 by Ocie Cornfield, RN  Aspiration Precautions:   upright posture maintained   tube feeding placement verified   respiratory status monitored   oral hygiene care promoted  Taken 03/08/2021 0400 by Ocie Cornfield, RN  Aspiration Precautions:   upright posture maintained   tube feeding placement verified  Taken 03/08/2021 0000 by Ocie Cornfield, RN  Aspiration Precautions:   upright posture maintained   tube feeding placement verified  Taken 03/07/2021 2200 by Ocie Cornfield, RN  Aspiration Precautions: upright posture maintained  Taken 03/07/2021 2000 by Ocie Cornfield, RN  Aspiration Precautions: awake/alert before oral intake     Problem: Inability to Wean (Mechanical Ventilation, Invasive)  Goal: Mechanical Ventilation Liberation  Outcome: Ongoing - Unchanged     Problem: Nutrition Impairment (Mechanical Ventilation, Invasive)  Goal: Optimal Nutrition Delivery  Outcome: Ongoing - Unchanged     Problem: Skin and Tissue Injury (Mechanical Ventilation, Invasive)  Goal: Absence of Device-Related Skin and Tissue Injury  Outcome: Ongoing - Unchanged     Problem: Ventilator-Induced Lung Injury (Mechanical Ventilation, Invasive)  Goal: Absence of Ventilator-Induced Lung Injury  Outcome: Ongoing - Unchanged  Intervention: Prevent Ventilator-Associated Pneumonia  Recent Flowsheet Documentation  Taken 03/08/2021 0600 by Ocie Cornfield, RN  Head of Bed [facility]) Positioning: HOB at 30-45 degrees  Oral Care: mouth swabbed  Suction Type: Oral  Taken 03/08/2021 0400 by Ocie Cornfield, RN  Head of Bed [facility]) Positioning: HOB at 30-45 degrees  Oral Care: mouth swabbed  Suction Type: Oral  Taken 03/08/2021 0000 by Ocie Cornfield, RN  Head of Bed [facility]) Positioning: HOB at 30-45 degrees  Oral Care:   suction provided   oral rinse provided  Suction Type: Oral  Taken 03/07/2021 2200 by Ocie Cornfield, RN  Head of Bed [facility]) Positioning: HOB at 30-45 degrees  Oral Care:   suction provided   mouth swabbed  Taken 03/07/2021 2000 by Ocie Cornfield, RN  Head of Bed [facility]) Positioning: HOB at 30-45 degrees  Oral Care: suction provided

## 2021-03-08 NOTE — Unmapped (Signed)
IMMUNOCOMPROMISED HOST INFECTIOUS DISEASE PROGRESS NOTE      Adam Keith is being seen in consultation at the request of [name], MD for evaluation of pneumonia.    Assessment/Recommendations:    Adam 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 krusei fungemia  - Prior chemotherapy:??[id number]-2005??induction??with dasatinib??(vincristine, dexamethasone and dasatinib);??C1D1 01/24/2020; difficulty tolerating single-agent dasatinib  - Current chemotherapy:??blinatumomab (anti-CD19/CD3) + ponatinib (TKI)??s/p??C4 on??12/14/20; 01/2021 now on ponatinib monotherapy maintenance  -??01/11/21:??Normocellular bone marrow (30% overall) with trilineage hematopoiesis and less than 1% blasts by manual aspirate differential;??Flow cytometry MRD analysis reveals no definitive immunophenotypic evidence of residual B lymphoblastic leukemia;??BCR-ABL p210 transcripts were detected at a level of 0.002 IS % ratio in bone marrow.    # Hypogammaglobulimia  - 11/01/20 IgG 266 declined IVIG  - 03/06/21 IgG 148    # AKI  Cr up to 3 from b/l ~1.6-1.8, improved with fluids.  ??  Pertinent Co-morbidities  # COPD/empysema  #??DIHS/DRESS??ceftaroline??06/12/2020, relapse??after cefepime??11/02/2020  - per prior ID notes possible culprits ceftaroline,??posaconazole, dasatinib, sotrovimab  -??12/14/20??discontinued??steroids  ????  Pertinent Exposure History??  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  Infection History  Active infections:??  # Influenza infection with secondary bacterial pneumonia 03/05/21  - 02/28/21 Fever and respiratory symptoms   - 12/2 : RPP - Flu +ve  - 12/2 : CXR - Right Lower Lobe airspace disease  - 12/3: CXR worsened opacities  Tx : Linezolid /Oseltamivir---> Linezolid/aztreonam/flagyl/azithromycin/oseltamivir 12/3 --> linezolid/aztreonam/osteltamivir 12/3 -->    Prior infections:  #MRSA??RLL PNA c/b bacteremia 11/01/2020, probable right empyema 8/12/202; presumed relapsed RML MRSA pneumonia 11/18/2020  - 8/19 Failed??thoracocentesis by IP  - 9/26 CT chest parenchyma without e/o infection, persistent but decreased loculated effusions on right  - s/p MRSA antibiotics until 01/04/2021  #Persistent right chest pain 12/29/20; Low grade fever, weakness 01/28/21  - 02/02/2021 - Dr. 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  Antimicrobial Intolerance/allergy  Cefepime - DRESS  Ceftaroline -??DRESS  Dapsone - possible??agranulocytosis, per chart anaphylaxis  Vancomycin - probable ototoxicity (in combination??with furosemide)  Isavuconazole - elevated LFTs in the setting of TKI     RECOMMENDATIONS    Diagnostic  ??? Please send BAL samples for cx for aerobic/anaerobic cx , fungal and AFB cx when able.  ??? Follow-up BCx 12/3   ??? IgG low at 148    Monitoring for antimicrobial toxicities  ??? While on antibiotics, monitor CBC with diff and CMP at least weekly    Treatment  ??? CONTINUE linezolid 500 mg bid  ??? CONTINUE aztreonam for empiric gram-negative coverage  ??? CONTINUE oseltamivir 75 mg bid x 5 days (12/1 -  12/5)  ??? Declined IVIG given prior bad reaction    Prophylaxis  ??? Cont valacyclovir, bactrim          The [facility] ID service will continue to follow.  Please page the ID Transplant/Liquid Oncology Fellow consult at [phone] with questions.    Kandice Moos, MD  Infectious Diseases Fellow  [facility]    Interval HPI:      Source of information includes:  Electronic Medical Records.  History obtained from:EMR   Interval events since last encounter: worsening respiratory status, intubated, Intermittently febrile, tmax 38.6. Pressures otherwise stable.  ROS: intubated    Medications:   Antimicrobials:  Linezolid 12/2 ->  Oseltamivir 12/2 ->  Aztreonam 12/3 ->  Azithromycin 12/3 ->    Metronidazole 12/3 x 1    Ppx: TMP/SMX, valacyclovir      Current/Prior immunomodulators:  ponatinib 30mg  monotherapy (held)    Other medications reviewed.          Vital Signs last 24 hours:  Temp:  [36.7 ??C (98.1 ??F)-38.7 ??C (101.7 ??F)] 38.7 ??C (101.7 ??F)  Heart Rate:  [86-126] 103  SpO2 Pulse:  [86-126] 103  Resp:  [9-30] 28  BP: (104-125)/(52-74) 125/63  MAP (mmHg):  [61-88] 77  A BP-2: (87-163)/(47-88) 93/51  MAP:  [61 mmHg-109 mmHg] 63 mmHg  FiO2 (%):  [10 %-100 %] 60 %  SpO2:  [87 %-98 %] 93 %  BMI (Calculated):  [32.06] 32.06    Physical Exam:   Patient Lines/Drains/Airways Status     Active Active Lines, Drains, & Airways     Name Placement date Placement time Site Days    ETT  7.5 03/07/21  2131  -- less than 1    CVC Triple Lumen 03/08/21 Non-tunneled Right Internal jugular 03/08/21  0132  Internal jugular  less than 1    NG/OG Tube Feedings Center mouth 03/08/21  0106  Center mouth  less than 1    Urethral Catheter 03/07/21  2200  --  less than 1    Peripheral IV 03/06/21 Left Arm 03/06/21  --  Arm  2    Peripheral IV 03/06/21 Anterior;Right Forearm 03/06/21  2030 Forearm  1    Peripheral IV 03/08/21 Right Antecubital 03/08/21  0102  Antecubital  less than 1    Arterial Line 03/07/21 Right Radial 03/07/21  1500  Radial  less than 1                Const [x]  vital signs above    []  NAD, non-toxic appearance []  Chronically ill-appearing, non-distressed  Ill-appearing,       Eyes [x]  Lids normal bilaterally, conjunctiva anicteric and noninjected OU     [x] PERRL  [] EOMI  Ill- appearing      ENMT [x]  Normal appearance of external nose and ears, no nasal discharge        []  MMM, no lesions on lips or gums [x]  No thrush, leukoplakia, oral lesions  []  Dentition good []  Edentulous []  Dental caries present  []  Hearing normal  []  TMs with good light reflexes bilaterally   Intubated and Proned      Neck [x]  Neck of normal appearance and trachea midline        []  No thyromegaly, nodules, or tenderness   []  Full neck ROM        Lymph []  No LAD in neck     []  No LAD in supraclavicular area     []  No LAD in axillae   []   No LAD in epitrochlear chains     []  No LAD in inguinal areas        CV [x]  RRR            [x]  No peripheral edema     []  Pedal pulses intact   [x]  No abnormal heart sounds appreciated   [x]  Extremities WWP         Resp []  Normal WOB at rest    []  No breathlessness with speaking, no coughing  []  CTA anteriorly    []  CTA posteriorly    Diffuse rhonchi throughout posteriorly, coarse crackles Right LLZ,      GI [x]  Normal inspection, NTND   [x]  NABS     []  No umbilical hernia on exam       []  No hepatosplenomegaly     []  Inspection of perineal and perianal areas normal        GU [x]  Normal external genitalia     [] No urinary catheter present in urethra   []  No CVA tenderness    []  No tenderness over renal allograft        MSK [x]  No clubbing or cyanosis of hands       []  No vertebral point tenderness  []  No focal tenderness or abnormalities on palpation of joints in RUE, LUE, RLE, or LLE        Skin [x]  No rashes, lesions, or ulcers of visualized skin     []  Skin warm and dry to palpation         Neuro [x]  Face expression symmetric  []  Sensation to light touch grossly intact throughout    []  Moves extremities equally    [x]  No tremor noted        []  CNs II-XII grossly intact     []  DTRs normal and symmetric throughout []  Gait unremarkable        Psych [x]  Appropriate affect       [x]  Fluent speech         []  Attentive, good eye contact  [x]  Oriented to person, place, time          []  Judgment and insight are appropriate           Data for Medical Decision Making     Recent Labs   Lab Units 03/08/21  0535 03/08/21  0435 03/08/21  0342 03/07/21  0027 03/06/21  1426   WBC 10*9/L  --   --  19.2*   < >  --    HEMOGLOBIN g/dL  --   --  16.1*   < >  --    HEMOGLOBIN BG g/dL  --  09.6*  --   --   --    PLATELET COUNT (1) 10*9/L  --   --  209   < >  --    NEUTRO ABS 10*9/L  --   --  17.5*   < >  --    LYMPHO ABS 10*9/L  --   --  0.5*   < >  --    EOSINO ABS 10*9/L  --   --  0.1   < >  --    SODIUM WHOLE BLOOD mmol/L 131* 133* 130*   < >  --    SODIUM mmol/L  --   --  131*   < > 137   POTASSIUM WHOLE BLOOD mmol/L 4.5 4.8* 4.7*   < >  --    POTASSIUM mmol/L  --   --  5.0*   < > 3.8   BUN mg/dL  --   --  33*   < > 24*   CREATININE mg/dL  --   --  1.61*   < > 0.96*   GLUCOSE mg/dL  --   --  045   < > 99   CALCIUM mg/dL  --   --  8.1*   < > 6.9*   MAGNESIUM mg/dL  --   --  2.4   < > 2.0   PHOSPHORUS mg/dL  --   --  6.3*   < >  --    BILIRUBIN TOTAL mg/dL  --   --  0.2*   < >  --    AST U/L  --   --  <8   < >  --    ALT U/L  --   --  11   < >  --    IGG mg/dL  --   --   --   --  409*    < > = values in this interval not displayed.     I reviewed and noted the following labs: persistent leukocytosis at 19.2. Hgb/plt slightly decreased. Cr elevated from baseline, stable from yesterday    Microbiology:  Past cultures were reviewed in Epic and CareEverywhere.  Review of cultures with microbiology: NA    BCx 12/3 x2: pending  UCx 122/3: pending    Imaging:  XR Chest 12/5 : Interval increase in right hemithorax consolidation. Persistent retrocardiac consolidation.No pleural effusion or pneumothorax    XR Chest 12/4: Slight increase in nonspecific left basilar opacities. Patchy and confluent heterogeneous opacities in the right upper lung are similar.      XR Chest Portable 03/06/2021    Narrative  EXAM: XR CHEST PORTABLE  DATE: 03/06/2021 10:06 AM  ACCESSION: [id number]  DICTATED: 03/06/2021 10:23 AM  INTERPRETATION LOCATION: [facility]    CLINICAL INDICATION: 40 years old Male with PNEUMONIA    COMPARISON: 03/05/2021    TECHNIQUE: Portable Chest Radiograph.    FINDINGS:    Marked interval worsening of airspace consolidation in the right upper and midlung zones. The left lung is clear.    No pleural effusion or pneumothorax.    Stable cardiomediastinal silhouette.    Impression  1. Marked interval worsening of right lung consolidation.       I have reviewed the images from 03/08/21 and agree with the findings.    Independent visualization of images: I independently reviewed the image from (date) and I agree with the findings/interpretation.    Additional Studies:   (12/3) EKG QTc 427

## 2021-03-08 NOTE — Unmapped (Signed)
Adult Nutrition Assessment Note    Visit Type: MD Consult  Reason for Visit: Enteral Nutrition      HPI & PMH:  Adam [name] with PMHx of ALL, COPD, prior MRSA bacteremia requiring intubation, MDD, lower back pain, tobacco use hx, presenting with??nausea, vomiting, fever initially found to be hypotensive and tachycardic at oncologists office, started on linezolid.??SOB worsened??in subsequent 24hrs??prompting presentation to the ED.??Admitted to the MICU due to c/f??acute hypoxic respiratory failure secondary to influenza infection with superimposed PNA.??  ??  24 Hr Intv: Overnight, desat to 70% requiring intubation. ETT, OGT, CVC placed. LRCx reveals 1+ gram + cocci.    Anthropometric Data:  Height: 185.4 cm (6' 1)   Admission weight: (!) 110.2 kg (242 lb 15.2 oz)  Last recorded weight: (!) 110.2 kg (242 lb 15.2 oz)  IBW: 83.51 kg  Percent IBW: 131.96 %  BMI: Body mass index is 32.05 kg/m??.   Usual Body Weight: ~240-245lbs per previous admission weights/trends    Weight history prior to admission:    Wt Readings from Last 10 Encounters:   03/07/21 (!) 110.2 kg (242 lb 15.2 oz)   03/05/21 (!) 109.5 kg (241 lb 4.8 oz)   02/11/21 (!) 111 kg (244 lb 11.2 oz)   02/02/21 (!) 110.4 kg (243 lb 6.4 oz)   01/25/21 (!) 109.3 kg (241 lb)   01/13/21 (!) 110 kg (242 lb 6.4 oz)   01/11/21 (!) 110 kg (242 lb 8.1 oz)   01/04/21 (!) 110.2 kg (242 lb 15.2 oz)   12/28/20 (!) 107 kg (235 lb 12.8 oz)   12/28/20 (!) 107 kg (235 lb 12.8 oz)        Weight changes this admission:   Last 5 Recorded Weights    03/07/21 1400   Weight: (!) 110.2 kg (242 lb 15.2 oz)        Nutrition Focused Physical Exam:  Nutrition Focused Physical Exam:                        Nutrition Evaluation  Overall Impressions: Nutrition-Focused Physical Exam not indicated due to lack of malnutrition risk factors. (03/08/21 1502)      NUTRITIONALLY RELEVANT DATA     Medications:   Nutritionally pertinent medications reviewed and evaluated for potential food and/or medication interactions.   Norepinephrine paused  Propofol at 70ml/hr = 686kcal/day  Dilaudid drip    Labs:     Results in Past 7 Days  Result Component Current Result   Sodium 131 (L) (03/08/2021)   Potassium 5.0 (H) (03/08/2021)   Chloride 101 (03/08/2021)   CO2 24.0 (03/08/2021)   BUN 33 (H) (03/08/2021)   Creatinine 2.44 (H) (03/08/2021)   Glucose 123 (03/08/2021)   Calcium 8.1 (L) (03/08/2021)   Calcium, Ionized Arterial 4.36 (L) (03/08/2021)   Magnesium 2.4 (03/08/2021)   Phosphorus 6.3 (H) (03/08/2021)   Albumin 2.4 (L) (03/08/2021)   Total Bilirubin 0.2 (L) (03/08/2021)   Bilirubin, Direct 0.10 (03/08/2021)   AST <8 (03/08/2021)   ALT 11 (03/08/2021)   Alkaline Phosphatase 114 (03/08/2021)   Triglycerides Not in Time Range       Nutrition History:   March 08, 2021: Prior to admission: Patient unable to provide at this time.     Allergies, Intolerances, Sensitivities, and/or Cultural/Religious Dietary Restrictions: none identified at this time     Current Nutrition:  Enteral nutrition via NG tube       Nutrition  Orders   (From admission, onward)             Start     Ordered    03/08/21 1450  Adult Enteral Nutrition Vital AF (Semi-Elemental)  (Adult Enteral Nutrition + Nutrition Consult Panel)  Effective now        Question Answer Comment   Enteral Nutrition Formula: Vital AF (Semi-Elemental)    Feeding Route: NG Tube    Enteral Nutrition Continuous initial rate (mL/hr): 10    Enteral Nutrition Increasing goal rate 10 q4    Final Goal (mL/hr) 60    Feeding Tube Flush (mL) 100 mL    Feeding Tube Flush Type: Water    Feeding Tube Flush Frequency: Every 4 hours        03/08/21 1450    03/06/21 1542  Nutrition Therapy Regular/House  Effective now        Question:  Nutrition Therapy:  Answer:  Regular/House    03/06/21 1541                   Nutritional Needs:   Daily Estimated Nutrient Needs:  Energy: 1210-1540 kcals 11-14 kcal/kg (per ASPEN/SCCM guidelines for the critically ill obese, BMI 30-50) using admission body weight, 110 kg (03/08/21 1501)]  Protein: 160 gm [> 2 gm/kg (per ASPEN/SCCM guidelines for the critically ill obese, BMI 30-39.9) using ideal body weight, 80 kg (03/08/21 1501)]  Carbohydrate:   [ ]   Fluid:   mL [ ]     As compared to [facility] for mechanically vented pts [place] using the following minute volume/temperature:  ~2626kcal/day    12.8   38.7         Malnutrition Assessment using AND/ASPEN Clinical Characteristics:    Patient does not meet AND/ASPEN criteria for malnutrition at this time (03/08/21 1502)       GOALS and EVALUATION     ??? Patient to meet 65% or greater of nutritional needs via enteral nutrition within the first week of ICU admission. - New    Motivation, Barriers, and Compliance:  Evaluation of motivation, barriers, and compliance pending at this time due to clinical status.     NUTRITION ASSESSMENT   Appropriate for tube feeds given mechanical ventilation    Will aim to feed between the above estimates    Will factor in propofol to avoid overfeeding.    Appropriate for high protein, standard lyte formula and can change to renal formula if hyperkalemia worsens with TF advancement    Discharge Planning:   Monitor for potential discharge needs with multi-disciplinary team.       NUTRITION INTERVENTIONS and RECOMMENDATION   For tube feeds on propofol, Recommend Vital High Protein at goal rate 85 mL/hr. This provides 1785 kcals, 157 g protein, 200 g carbohydrate, 42 g fat, and 1493 mL free water.  Start at 20-55ml/hr and increase by 20-25ml q4hrs to goal  FWF per team  Weekly weights    Follow-Up Parameters:   1-2 times per week (and more frequent as indicated)    Arcola Jansky MS, RD, LDN, CNSC

## 2021-03-09 LAB — BLOOD GAS, ARTERIAL
BASE EXCESS ARTERIAL: -3.6 — ABNORMAL LOW (ref -2.0–2.0)
BASE EXCESS ARTERIAL: -3.9 — ABNORMAL LOW (ref -2.0–2.0)
BASE EXCESS ARTERIAL: -4.4 — ABNORMAL LOW (ref -2.0–2.0)
BASE EXCESS ARTERIAL: -4.6 — ABNORMAL LOW (ref -2.0–2.0)
BASE EXCESS ARTERIAL: -4.9 — ABNORMAL LOW (ref -2.0–2.0)
BASE EXCESS ARTERIAL: -4.9 — ABNORMAL LOW (ref -2.0–2.0)
BASE EXCESS ARTERIAL: -5.6 — ABNORMAL LOW (ref -2.0–2.0)
FIO2 ARTERIAL: 60
FIO2 ARTERIAL: 65
FIO2 ARTERIAL: 65
HCO3 ARTERIAL: 20 mmol/L — ABNORMAL LOW (ref 22–27)
HCO3 ARTERIAL: 20 mmol/L — ABNORMAL LOW (ref 22–27)
HCO3 ARTERIAL: 20 mmol/L — ABNORMAL LOW (ref 22–27)
HCO3 ARTERIAL: 20 mmol/L — ABNORMAL LOW (ref 22–27)
HCO3 ARTERIAL: 21 mmol/L — ABNORMAL LOW (ref 22–27)
HCO3 ARTERIAL: 21 mmol/L — ABNORMAL LOW (ref 22–27)
HCO3 ARTERIAL: 21 mmol/L — ABNORMAL LOW (ref 22–27)
O2 SATURATION ARTERIAL: 88.5 % — ABNORMAL LOW (ref 94.0–100.0)
O2 SATURATION ARTERIAL: 90.4 % — ABNORMAL LOW (ref 94.0–100.0)
O2 SATURATION ARTERIAL: 91 % — ABNORMAL LOW (ref 94.0–100.0)
O2 SATURATION ARTERIAL: 96.5 % (ref 94.0–100.0)
O2 SATURATION ARTERIAL: 96.5 % (ref 94.0–100.0)
O2 SATURATION ARTERIAL: 97.1 % (ref 94.0–100.0)
O2 SATURATION ARTERIAL: 97.8 % (ref 94.0–100.0)
PCO2 ARTERIAL: 35.9 mmHg (ref 35.0–45.0)
PCO2 ARTERIAL: 38 mmHg (ref 35.0–45.0)
PCO2 ARTERIAL: 38.7 mmHg (ref 35.0–45.0)
PCO2 ARTERIAL: 39.1 mmHg (ref 35.0–45.0)
PCO2 ARTERIAL: 39.5 mmHg (ref 35.0–45.0)
PCO2 ARTERIAL: 39.5 mmHg (ref 35.0–45.0)
PCO2 ARTERIAL: 39.6 mmHg (ref 35.0–45.0)
PH ARTERIAL: 7.32 — ABNORMAL LOW (ref 7.35–7.45)
PH ARTERIAL: 7.33 — ABNORMAL LOW (ref 7.35–7.45)
PH ARTERIAL: 7.33 — ABNORMAL LOW (ref 7.35–7.45)
PH ARTERIAL: 7.34 — ABNORMAL LOW (ref 7.35–7.45)
PH ARTERIAL: 7.34 — ABNORMAL LOW (ref 7.35–7.45)
PH ARTERIAL: 7.35 (ref 7.35–7.45)
PH ARTERIAL: 7.38 (ref 7.35–7.45)
PO2 ARTERIAL: 57.2 mmHg — ABNORMAL LOW (ref 80.0–110.0)
PO2 ARTERIAL: 57.2 mmHg — ABNORMAL LOW (ref 80.0–110.0)
PO2 ARTERIAL: 60.1 mmHg — ABNORMAL LOW (ref 80.0–110.0)
PO2 ARTERIAL: 85.4 mmHg (ref 80.0–110.0)
PO2 ARTERIAL: 89.6 mmHg (ref 80.0–110.0)
PO2 ARTERIAL: 95.1 mmHg (ref 80.0–110.0)
PO2 ARTERIAL: 97.4 mmHg (ref 80.0–110.0)

## 2021-03-09 LAB — CBC
HEMATOCRIT: 30.6 % — ABNORMAL LOW (ref 39.0–48.0)
HEMOGLOBIN: 10.3 g/dL — ABNORMAL LOW (ref 12.9–16.5)
MEAN CORPUSCULAR HEMOGLOBIN CONC: 33.6 g/dL (ref 32.0–36.0)
MEAN CORPUSCULAR HEMOGLOBIN: 28.7 pg (ref 25.9–32.4)
MEAN CORPUSCULAR VOLUME: 85.5 fL (ref 77.6–95.7)
MEAN PLATELET VOLUME: 8 fL (ref 6.8–10.7)
PLATELET COUNT: 224 10*9/L (ref 150–450)
RED BLOOD CELL COUNT: 3.58 10*12/L — ABNORMAL LOW (ref 4.26–5.60)
RED CELL DISTRIBUTION WIDTH: 16.8 % — ABNORMAL HIGH (ref 12.2–15.2)
WBC ADJUSTED: 17.1 10*9/L — ABNORMAL HIGH (ref 3.6–11.2)

## 2021-03-09 LAB — BASIC METABOLIC PANEL
ANION GAP: 10 mmol/L (ref 5–14)
ANION GAP: 11 mmol/L (ref 5–14)
BLOOD UREA NITROGEN: 36 mg/dL — ABNORMAL HIGH (ref 9–23)
BLOOD UREA NITROGEN: 35 mg/dL — ABNORMAL HIGH (ref 9–23)
BUN / CREAT RATIO: 14
BUN / CREAT RATIO: 14
CALCIUM: 8.7 mg/dL (ref 8.7–10.4)
CALCIUM: 8.6 mg/dL — ABNORMAL LOW (ref 8.7–10.4)
CHLORIDE: 101 mmol/L (ref 98–107)
CHLORIDE: 102 mmol/L (ref 98–107)
CO2: 21 mmol/L (ref 20.0–31.0)
CO2: 21 mmol/L (ref 20.0–31.0)
CREATININE: 2.64 mg/dL — ABNORMAL HIGH
CREATININE: 2.45 mg/dL — ABNORMAL HIGH
EGFR CKD-EPI (2021) MALE: 30 mL/min/{1.73_m2} — ABNORMAL LOW (ref >=60)
EGFR CKD-EPI (2021) MALE: 33 mL/min/{1.73_m2} — ABNORMAL LOW (ref >=60)
GLUCOSE RANDOM: 113 mg/dL (ref 70–179)
GLUCOSE RANDOM: 105 mg/dL (ref 70–179)
POTASSIUM: 4.3 mmol/L (ref 3.4–4.8)
POTASSIUM: 4.2 mmol/L (ref 3.4–4.8)
SODIUM: 132 mmol/L — ABNORMAL LOW (ref 135–145)
SODIUM: 134 mmol/L — ABNORMAL LOW (ref 135–145)

## 2021-03-09 LAB — MAGNESIUM
MAGNESIUM: 2.6 mg/dL (ref 1.6–2.6)
MAGNESIUM: 2.4 mg/dL (ref 1.6–2.6)

## 2021-03-09 LAB — BLOOD GAS CRITICAL CARE PANEL, ARTERIAL
BASE EXCESS ARTERIAL: -5.7 — ABNORMAL LOW (ref -2.0–2.0)
CALCIUM IONIZED ARTERIAL (MG/DL): 4.14 mg/dL — ABNORMAL LOW (ref 4.40–5.40)
GLUCOSE WHOLE BLOOD: 102 mg/dL (ref 70–179)
HCO3 ARTERIAL: 20 mmol/L — ABNORMAL LOW (ref 22–27)
HEMOGLOBIN BLOOD GAS: 7.7 g/dL — ABNORMAL LOW (ref 13.50–17.50)
LACTATE BLOOD ARTERIAL: 0.7 mmol/L (ref <1.3)
O2 SATURATION ARTERIAL: 96.8 % (ref 94.0–100.0)
PCO2 ARTERIAL: 37.6 mmHg (ref 35.0–45.0)
PH ARTERIAL: 7.33 — ABNORMAL LOW (ref 7.35–7.45)
PO2 ARTERIAL: 82.2 mmHg (ref 80.0–110.0)
POTASSIUM WHOLE BLOOD: 3.4 mmol/L (ref 3.4–4.6)
SODIUM WHOLE BLOOD: 137 mmol/L (ref 135–145)

## 2021-03-09 LAB — CBC W/ AUTO DIFF
BASOPHILS ABSOLUTE COUNT: 0.1 10*9/L (ref 0.0–0.1)
BASOPHILS RELATIVE PERCENT: 0.9 %
EOSINOPHILS ABSOLUTE COUNT: 0.3 10*9/L (ref 0.0–0.5)
EOSINOPHILS RELATIVE PERCENT: 1.9 %
HEMATOCRIT: 30.6 % — ABNORMAL LOW (ref 39.0–48.0)
HEMOGLOBIN: 10.5 g/dL — ABNORMAL LOW (ref 12.9–16.5)
LYMPHOCYTES ABSOLUTE COUNT: 0.7 10*9/L — ABNORMAL LOW (ref 1.1–3.6)
LYMPHOCYTES RELATIVE PERCENT: 4.4 %
MEAN CORPUSCULAR HEMOGLOBIN CONC: 34.2 g/dL (ref 32.0–36.0)
MEAN CORPUSCULAR HEMOGLOBIN: 29.2 pg (ref 25.9–32.4)
MEAN CORPUSCULAR VOLUME: 85.2 fL (ref 77.6–95.7)
MEAN PLATELET VOLUME: 8.4 fL (ref 6.8–10.7)
MONOCYTES ABSOLUTE COUNT: 0.9 10*9/L — ABNORMAL HIGH (ref 0.3–0.8)
MONOCYTES RELATIVE PERCENT: 5.5 %
NEUTROPHILS ABSOLUTE COUNT: 14.3 10*9/L — ABNORMAL HIGH (ref 1.8–7.8)
NEUTROPHILS RELATIVE PERCENT: 87.3 %
PLATELET COUNT: 225 10*9/L (ref 150–450)
RED BLOOD CELL COUNT: 3.59 10*12/L — ABNORMAL LOW (ref 4.26–5.60)
RED CELL DISTRIBUTION WIDTH: 16.4 % — ABNORMAL HIGH (ref 12.2–15.2)
WBC ADJUSTED: 16.4 10*9/L — ABNORMAL HIGH (ref 3.6–11.2)

## 2021-03-09 LAB — TRIGLYCERIDES: TRIGLYCERIDES: 799 mg/dL — ABNORMAL HIGH (ref 0–150)

## 2021-03-09 LAB — HEPATIC FUNCTION PANEL
ALBUMIN: 2.2 g/dL — ABNORMAL LOW (ref 3.4–5.0)
ALKALINE PHOSPHATASE: 118 U/L — ABNORMAL HIGH (ref 46–116)
ALT (SGPT): <7 U/L — ABNORMAL LOW (ref 10–49)
AST (SGOT): <8 U/L (ref <=34)
BILIRUBIN DIRECT: 0.2 mg/dL (ref 0.00–0.30)
BILIRUBIN TOTAL: 0.4 mg/dL (ref 0.3–1.2)
PROTEIN TOTAL: 6.1 g/dL (ref 5.7–8.2)

## 2021-03-09 LAB — FUNGITELL ASSAY
FUNGITELL QUALITATIVE: NEGATIVE
FUNGITELL: <31 pg/mL

## 2021-03-09 LAB — PHOSPHORUS: PHOSPHORUS: 4.8 mg/dL (ref 2.4–5.1)

## 2021-03-09 MED ADMIN — oseltamivir (TAMIFLU) capsule 30 mg: 30 mg | ORAL | @ 01:00:00 | Stop: 2021-03-10

## 2021-03-09 MED ADMIN — propofol (DIPRIVAN) infusion 10 mg/mL: 0-50 ug/kg/min | INTRAVENOUS | @ 02:00:00 | Stop: 2021-03-08

## 2021-03-09 MED ADMIN — famotidine (PEPCID) tablet 20 mg: 20 mg | GASTROENTERAL | @ 13:00:00 | Stop: 2021-03-09

## 2021-03-09 MED ADMIN — valACYclovir (VALTREX) tablet 500 mg: 500 mg | ORAL | @ 13:00:00

## 2021-03-09 MED ADMIN — HYDROmorphone 1 mg/mL in 0.9% sodium chloride: 0-10 mg/h | INTRAVENOUS | @ 22:00:00 | Stop: 2021-03-21

## 2021-03-09 MED ADMIN — HYDROmorphone 1 mg/mL in 0.9% sodium chloride: 0-10 mg/h | INTRAVENOUS | @ 16:00:00 | Stop: 2021-03-09

## 2021-03-09 MED ADMIN — heparin (porcine) 5,000 unit/mL injection 5,000 Units: 5000 [IU] | SUBCUTANEOUS | @ 19:00:00

## 2021-03-09 MED ADMIN — HYDROmorphone 1 mg/mL in 0.9% sodium chloride: 0-10 mg/h | INTRAVENOUS | @ 06:00:00 | Stop: 2021-03-09

## 2021-03-09 MED ADMIN — linezolid in dextrose 5% (ZYVOX) 600 mg/300 mL IVPB 600 mg: 600 mg | INTRAVENOUS | @ 01:00:00 | Stop: 2021-03-13

## 2021-03-09 MED ADMIN — oxyCODONE (ROXICODONE) immediate release tablet 10 mg: 10 mg | ORAL | @ 11:00:00 | Stop: 2021-03-09

## 2021-03-09 MED ADMIN — HYDROmorphone (PF) (DILAUDID) injection 2 mg: 2 mg | INTRAVENOUS | @ 15:00:00 | Stop: 2021-03-21

## 2021-03-09 MED ADMIN — linezolid in dextrose 5% (ZYVOX) 600 mg/300 mL IVPB 600 mg: 600 mg | INTRAVENOUS | @ 14:00:00 | Stop: 2021-03-13

## 2021-03-09 MED ADMIN — ipratropium-albuteroL (DUO-NEB) 0.5-2.5 mg/3 mL nebulizer solution 3 mL: 3 mL | RESPIRATORY_TRACT | @ 02:00:00

## 2021-03-09 MED ADMIN — oxyCODONE (ROXICODONE) immediate release tablet 10 mg: 10 mg | GASTROENTERAL | @ 21:00:00 | Stop: 2021-03-23

## 2021-03-09 MED ADMIN — oxyCODONE (ROXICODONE) immediate release tablet 10 mg: 10 mg | ORAL | @ 03:00:00 | Stop: 2021-03-21

## 2021-03-09 MED ADMIN — melatonin tablet 3 mg: 3 mg | GASTROENTERAL | @ 22:00:00

## 2021-03-09 MED ADMIN — midazolam (VERSED) injection 1 mg: 1 mg | INTRAVENOUS | @ 15:00:00

## 2021-03-09 MED ADMIN — famotidine (PEPCID) tablet 20 mg: 20 mg | GASTROENTERAL | @ 01:00:00

## 2021-03-09 MED ADMIN — oxyCODONE (ROXICODONE) immediate release tablet 10 mg: 10 mg | GASTROENTERAL | @ 16:00:00 | Stop: 2021-03-23

## 2021-03-09 MED ADMIN — clonazePAM (KlonoPIN) disintegrating tablet 0.5 mg: .5 mg | GASTROENTERAL | @ 13:00:00 | Stop: 2021-03-09

## 2021-03-09 MED ADMIN — clonazePAM (KlonoPIN) disintegrating tablet 0.5 mg: .5 mg | GASTROENTERAL | @ 01:00:00

## 2021-03-09 MED ADMIN — aztreonam (AZACTAM) 2 g in sodium chloride 0.9 % (NS) 100 mL IVPB-connector bag: 2 g | INTRAVENOUS | @ 13:00:00 | Stop: 2021-03-09

## 2021-03-09 MED ADMIN — oseltamivir (TAMIFLU) capsule 30 mg: 30 mg | ORAL | @ 13:00:00 | Stop: 2021-03-10

## 2021-03-09 MED ADMIN — heparin (porcine) 5,000 unit/mL injection 5,000 Units: 5000 [IU] | SUBCUTANEOUS | @ 03:00:00

## 2021-03-09 MED ADMIN — ipratropium-albuteroL (DUO-NEB) 0.5-2.5 mg/3 mL nebulizer solution 3 mL: 3 mL | RESPIRATORY_TRACT | @ 09:00:00

## 2021-03-09 MED ADMIN — carboxymethylcellulose sodium (THERATEARS) 0.25 % ophthalmic solution 2 drop: 2 [drp] | OPHTHALMIC | @ 20:00:00

## 2021-03-09 MED ADMIN — heparin (porcine) 5,000 unit/mL injection 5,000 Units: 5000 [IU] | SUBCUTANEOUS | @ 10:00:00

## 2021-03-09 MED ADMIN — carboxymethylcellulose sodium (THERATEARS) 0.25 % ophthalmic solution 2 drop: 2 [drp] | OPHTHALMIC | @ 22:00:00

## 2021-03-09 MED ADMIN — chlorhexidine (PERIDEX) 0.12 % solution 15 mL: 15 mL | TOPICAL | @ 01:00:00

## 2021-03-09 MED ADMIN — midazolam in sodium chloride 0.9% (1 mg/mL) infusion PMB: 0-6 mg/h | INTRAVENOUS | @ 10:00:00 | Stop: 2021-03-09

## 2021-03-09 MED ADMIN — aztreonam (AZACTAM) 2 g in sodium chloride 0.9 % (NS) 100 mL IVPB-connector bag: 2 g | INTRAVENOUS | @ 01:00:00 | Stop: 2021-03-13

## 2021-03-09 MED ADMIN — gabapentin (NEURONTIN) capsule 300 mg: 300 mg | ORAL | @ 01:00:00

## 2021-03-09 MED ADMIN — acetaminophen (TYLENOL) tablet 650 mg: 650 mg | ORAL | @ 08:00:00

## 2021-03-09 MED ADMIN — gabapentin (NEURONTIN) capsule 300 mg: 300 mg | ORAL | @ 13:00:00

## 2021-03-09 MED ADMIN — LORazepam (ATIVAN) injection 4 mg: 4 mg | INTRAVENOUS | @ 21:00:00

## 2021-03-09 MED ADMIN — LORazepam (ATIVAN) injection 6 mg: 6 mg | INTRAVENOUS | @ 16:00:00 | Stop: 2021-03-09

## 2021-03-09 MED ADMIN — midazolam in sodium chloride 0.9% (1 mg/mL) infusion PMB: 0-6 mg/h | INTRAVENOUS | @ 03:00:00

## 2021-03-09 MED ADMIN — HYDROmorphone (PF) (DILAUDID) injection 2 mg: 2 mg | INTRAVENOUS | @ 06:00:00 | Stop: 2021-03-21

## 2021-03-09 MED ADMIN — gabapentin (NEURONTIN) capsule 300 mg: 300 mg | ORAL | @ 19:00:00

## 2021-03-09 NOTE — Unmapped (Signed)
Care Management  Initial Transition Planning Assessment      No family at bedside. CM talked with wife Adam Keith by phone to complete assessment as well as to confirm all information pertaining to any discharge planning needs. CM introduced self, explained role, and confirmed demographic information.     Patient lives with Adam Keith (wife) in [place] [facility]) in a single level home with five steps to enter. At baseline patient ambulates without assistance and is independent with ADL's. He does not use home health services. No DME  Adam Keith will transport home and assist with basic care. Patient is vaccinated but has not received a bivalent booster              General  Care Manager assessed the patient by : In person interview with family, Medical record review, Discussion with Clinical Care team  Orientation Level: Other (Comment) (UTA)  Functional level prior to admission: Independent  Reason for referral: Discharge Planning    Contact/Decision Maker  Extended Emergency Contact Information  Primary Emergency Contact: Adam Keith  Mobile Phone: [phone]  Relation: Spouse  Preferred language: ENGLISH  Interpreter needed? No    Legal Next of Kin / Guardian / POA / Advance Directives     HCDM (patient stated preference): Adam Keith - [phone]    Advance Directive (Medical Treatment)  Does patient have an advance directive covering medical treatment?: Patient does not have advance directive covering medical treatment.  Reason patient does not have an advance directive covering medical treatment:: Patient has been deemed unable to make medical decisions, cannot communicate.    Health Care Decision Maker [HCDM] (Medical & Mental Health Treatment)  Healthcare Decision Maker: HCDM documented in the HCDM/Contact Info section. Adam Keith (wife) [phone])  Information offered on HCDM, Medical & Mental Health advance directives:: Patient declined information.       Readmission Information    Have you been hospitalized in the last 30 days?: No        Patient Information  Lives with: Spouse/significant other    Type of Residence: Private residence (Single level [place] five steps to enter)        Location/Detail: [address]  [place], [place] [address]    Support Systems/Concerns: Spouse    Responsibilities/Dependents at home?: No    Home Care services in place prior to admission?: No      Equipment Currently Used at Home: none     Currently receiving outpatient dialysis?: No     Financial Information     Need for financial assistance?: No     Social Determinants of Health  Social Determinants of Health     Food Insecurity: No Food Insecurity   ??? Worried About Programme researcher, broadcasting/film/video in the Last Year: Never true   ??? Ran Out of Food in the Last Year: Never true   Tobacco Use: Medium Risk   ??? Smoking Tobacco Use: Former   ??? Smokeless Tobacco Use: Former   ??? Passive Exposure: Not on file   Transportation Needs: No Transportation Needs   ??? Lack of Transportation (Medical): No   ??? Lack of Transportation (Non-Medical): No   Alcohol Use: Not on file   Housing/Utilities: Low Risk    ??? Within the past 12 months, have you ever stayed: outside, in a car, in a tent, in an overnight shelter, or temporarily in someone else's home (i.e. couch-surfing)?: No   ??? Are you worried about losing your housing?: No   ???  Within the past 12 months, have you been unable to get utilities (heat, electricity) when it was really needed?: No   Substance Use: Not on file   Financial Resource Strain: Low Risk    ??? Difficulty of Paying Living Expenses: Not very hard   Physical Activity: Not on file   Health Literacy: Not on file   Stress: Not on file   Intimate Partner Violence: Not on file   Depression: Not on file   Social Connections: Not on file       Complex Discharge Information    Is patient identified as a difficult/complex discharge?: No       Discharge Needs Assessment  Concerns to be Addressed: discharge planning    Clinical Risk Factors: New Diagnosis, Multiple Diagnoses (Chronic), Principal Diagnosis: Cancer, Stroke, COPD, Heart Failure, AMI, Pneumonia, Joint Replacment    Barriers to taking medications: No    Prior overnight hospital stay or ED visit in last 90 days: No       Patient's Choice of Community Agency(s): Family is agreeable to home health services if recommended, no preference of agency but would like to look at a list of agencies if [facility] is recommended.    Anticipated Changes Related to Illness: other (see comments) (Likely will need time to recover before resuming usual activities.)    Equipment Needed After Discharge: other (see comments) (CM will follow for DME needs.)    Discharge Facility/Level of Care Needs: other (see comments) (CM will follow for DC needs.)    Readmission  Risk of Unplanned Readmission Score: UNPLANNED READMISSION SCORE: 47.19%  Predictive Model Details          47% (High)  Factor Value    Calculated 03/08/2021 16:04 20% Number of hospitalizations in last year 7    [facility] Risk of Unplanned Readmission Model 16% Number of active Rx orders 41     10% Number of ED visits in last six months 3     6% Diagnosis of cancer present     5% ECG/EKG order present in last 6 months     5% Latest calcium low (8.1 mg/dL)     5% Latest BUN high (33 mg/dL)     4% Diagnosis of electrolyte disorder present     4% Restraint order present in last 6 months     4% Imaging order present in last 6 months     3% Latest hemoglobin low (11.1 g/dL)     3% Phosphorous result present     3% Charlson Comorbidity Index 4     3% Active anticoagulant Rx order present     3% Latest creatinine high (2.44 mg/dL)     2% Diagnosis of renal failure present     2% Age 40     1% Current length of stay 2.188 days     1% Future appointment scheduled     1% Active ulcer medication Rx order present      Readmitted Within the Last 30 Days? (No if blank)   Patient at risk for readmission?: Yes    Discharge Plan  Screen findings are: Discharge planning needs identified or anticipated (Comment). (CM will follow for DC needs.)    Expected Discharge Date:     Expected Transfer from Critical Care:       Patient and/or family were provided with choice of facilities / services that are available and appropriate to meet post hospital care needs?: Other (Comment) (Choice of facilities/services will be provided  if deemed medically necessary.)     Initial Assessment complete?: Yes

## 2021-03-09 NOTE — Unmapped (Signed)
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 *          Cross cover summary       - TGs very elevated, switched from propofol > versed      Cephus Slater, MD

## 2021-03-09 NOTE — Unmapped (Signed)
Respiratory Safety Check    Date: 03/09/2021     PPV Device at bedside:   [x]  Yes  []  Replaced  []  N/A    PPV mask at bedside:  [x]  Yes  []  Replaced  []  N/A    Checked humidifier, inspiratory limb, and expiratory limb:   [x]  Yes  []  Replaced  []  N/A    Backup trachs and supplies available at bedside:  []  Yes  []  Replaced  [x]  N/A    Documented trach airway form visible at bedside:   []  Yes  []  Replaced  [x]  N/A    iNO connected with PPV device:  []  Yes  []  Replaced  [x]  N/A

## 2021-03-09 NOTE — Unmapped (Signed)
Pt remains sedated and proned. Weaned off versed, remains on propofol/dilaudid gtt for a RASS -4/-5 with ventilator compliance. FiO2 titrated down to 60%, pt tolerated well, satting 92-94% throughout shift. Norepinephrine titrated on/off multiple times throughout shift (see flowsheets/MAR), but pt unable to tolerate being completely off for longer than approx 1 hour. Urine output maintained <60/hr throughout shift. Tmax 38.7C, down to 38.1C with tylenol. All other VSS. Pt tolerated turns (swimming) without hemodynamic or oxygenation instability. Pt's [name] updated at bedside in the am and pm by RN and ICU Fellow.     Problem: Adult Inpatient Plan of Care  Goal: Plan of Care Review  Outcome: Progressing  Goal: Patient-Specific Goal (Individualized)  Outcome: Progressing  Goal: Absence of Hospital-Acquired Illness or Injury  Outcome: Progressing  Intervention: Identify and Manage Fall Risk  Recent Flowsheet Documentation  Taken 03/08/2021 1200 by Inocencio Homes, RN  Safety Interventions:   isolation precautions   infection management  Taken 03/08/2021 0800 by Inocencio Homes, RN  Safety Interventions:   aspiration precautions   bed alarm   enteral feeding safety   environmental modification   fall reduction program maintained   family at bedside   infection management   isolation precautions   lighting adjusted for tasks/safety   low bed  Intervention: Prevent Skin Injury  Recent Flowsheet Documentation  Taken 03/08/2021 0800 by Inocencio Homes, RN  Skin Protection:   pulse oximeter probe site changed   incontinence pads utilized   skin sealant/moisture barrier applied   skin-to-device areas padded   skin-to-skin areas padded   silicone foam dressing in place  Intervention: Prevent and Manage VTE (Venous Thromboembolism) Risk  Recent Flowsheet Documentation  Taken 03/08/2021 0800 by Inocencio Homes, RN  Activity Management: bedrest  Range of Motion: Bilateral Upper and Lower Extremities  Intervention: Prevent Infection  Recent Flowsheet Documentation  Taken 03/08/2021 1200 by Inocencio Homes, RN  Infection Prevention:   single patient room provided   rest/sleep promoted   hand hygiene promoted   personal protective equipment utilized   equipment surfaces disinfected   environmental surveillance performed  Taken 03/08/2021 0800 by Inocencio Homes, RN  Infection Prevention:   environmental surveillance performed   equipment surfaces disinfected   hand hygiene promoted   single patient room provided   personal protective equipment utilized   rest/sleep promoted  Goal: Optimal Comfort and Wellbeing  Outcome: Progressing  Goal: Readiness for Transition of Care  Outcome: Progressing  Goal: Rounds/Family Conference  Outcome: Progressing     Problem: Infection  Goal: Absence of Infection Signs and Symptoms  Outcome: Progressing  Intervention: Prevent or Manage Infection  Recent Flowsheet Documentation  Taken 03/08/2021 1200 by Inocencio Homes, RN  Isolation Precautions:   droplet precautions maintained   contact precautions initiated  Taken 03/08/2021 0800 by Inocencio Homes, RN  Infection Management: cultures obtained and sent to lab  Isolation Precautions: droplet precautions maintained     Problem: Skin Injury Risk Increased  Goal: Skin Health and Integrity  Outcome: Progressing  Intervention: Optimize Skin Protection  Recent Flowsheet Documentation  Taken 03/08/2021 0800 by Inocencio Homes, RN  Pressure Reduction Techniques:   frequent weight shift encouraged   pressure points protected   positioned off wounds   rest period provided between sit times  Head of Bed [facility]) Positioning: (proned)   other (see comments)   reverse trandelenberg  Pressure Reduction Devices:   positioning supports utilized   pressure-redistributing mattress utilized   specialty bed utilized  Skin Protection:  pulse oximeter probe site changed   incontinence pads utilized   skin sealant/moisture barrier applied   skin-to-device areas padded skin-to-skin areas padded   silicone foam dressing in place  Taken 03/08/2021 0700 by Inocencio Homes, RN  Head of Bed [facility]) Positioning: (proned)   reverse trandelenberg   other (see comments)     Problem: Fluid Imbalance (Pneumonia)  Goal: Fluid Balance  Outcome: Progressing  Intervention: Monitor and Manage Fluid Balance  Recent Flowsheet Documentation  Taken 03/08/2021 0800 by Inocencio Homes, RN  Fluid/Electrolyte Management: electrolyte supplement adjusted     Problem: Infection (Pneumonia)  Goal: Resolution of Infection Signs and Symptoms  Outcome: Progressing  Intervention: Prevent Infection Progression  Recent Flowsheet Documentation  Taken 03/08/2021 1200 by Inocencio Homes, RN  Isolation Precautions:   droplet precautions maintained   contact precautions initiated  Taken 03/08/2021 0800 by Inocencio Homes, RN  Infection Management: cultures obtained and sent to lab  Isolation Precautions: droplet precautions maintained     Problem: Respiratory Compromise (Pneumonia)  Goal: Effective Oxygenation and Ventilation  Outcome: Progressing  Intervention: Optimize Oxygenation and Ventilation  Recent Flowsheet Documentation  Taken 03/08/2021 0800 by Inocencio Homes, RN  Head of Bed [facility]) Positioning: (proned)   other (see comments)   reverse trandelenberg  Airway/Ventilation Management: airway patency maintained  Taken 03/08/2021 0700 by Inocencio Homes, RN  Head of Bed [facility]) Positioning: (proned)   reverse trandelenberg   other (see comments)     Problem: Pain Chronic (Persistent) (Comorbidity Management)  Goal: Acceptable Pain Control and Functional Ability  Outcome: Progressing     Problem: Self-Care Deficit  Goal: Improved Ability to Complete Activities of Daily Living  Outcome: Progressing     Problem: Non-Violent Restraints  Goal: Patient will remain free of restraint events  Outcome: Progressing  Goal: Patient will remain free of physical injury  Outcome: Progressing     Problem: Communication Impairment (Mechanical Ventilation, Invasive)  Goal: Effective Communication  Outcome: Progressing     Problem: Device-Related Complication Risk (Mechanical Ventilation, Invasive)  Goal: Optimal Device Function  Outcome: Progressing  Intervention: Optimize Device Care and Function  Recent Flowsheet Documentation  Taken 03/08/2021 0800 by Inocencio Homes, RN  Aspiration Precautions:   tube feeding placement verified   oral hygiene care promoted   respiratory status monitored     Problem: Inability to Wean (Mechanical Ventilation, Invasive)  Goal: Mechanical Ventilation Liberation  Outcome: Progressing     Problem: Nutrition Impairment (Mechanical Ventilation, Invasive)  Goal: Optimal Nutrition Delivery  Outcome: Progressing     Problem: Skin and Tissue Injury (Mechanical Ventilation, Invasive)  Goal: Absence of Device-Related Skin and Tissue Injury  Outcome: Progressing     Problem: Ventilator-Induced Lung Injury (Mechanical Ventilation, Invasive)  Goal: Absence of Ventilator-Induced Lung Injury  Outcome: Progressing  Intervention: Prevent Ventilator-Associated Pneumonia  Recent Flowsheet Documentation  Taken 03/08/2021 1753 by Inocencio Homes, RN  Oral Care: suction provided  Taken 03/08/2021 1700 by Inocencio Homes, RN  Suction Type: Oral  Taken 03/08/2021 1600 by Inocencio Homes, RN  Oral Care:   lip/mouth moisturizer applied   suction provided  Suction Type: Oral  Taken 03/08/2021 1500 by Inocencio Homes, RN  Suction Type: Oral  Taken 03/08/2021 1400 by Inocencio Homes, RN  Oral Care:   lip/mouth moisturizer applied   suction provided  Suction Type: Oral  Taken 03/08/2021 1300 by Inocencio Homes, RN  Suction Type: Oral  Taken 03/08/2021 1200 by Inocencio Homes, RN  Oral Care:   lip/mouth moisturizer applied  mouth swabbed  Suction Type: Oral  Taken 03/08/2021 1100 by Inocencio Homes, RN  Suction Type: Oral  Taken 03/08/2021 1000 by Inocencio Homes, RN  Oral Care:   lip/mouth moisturizer applied   suction provided  Suction Type: Oral  Taken 03/08/2021 0900 by Inocencio Homes, RN  Suction Type: Oral  Taken 03/08/2021 0800 by Inocencio Homes, RN  Head of Bed [facility]) Positioning: (proned)   other (see comments)   reverse trandelenberg  Oral Care: mouth swabbed  Suction Type: Oral  Taken 03/08/2021 0700 by Inocencio Homes, RN  Head of Bed [facility]) Positioning: (proned)   reverse trandelenberg   other (see comments)  Oral Care:   mouth swabbed   suction provided  Suction Type: Oral

## 2021-03-09 NOTE — Unmapped (Signed)
Patient remains intubated and sedated in the ICU. RASS of -3 to -5 throughout shift. Will open eyes to pain / when stimulated. Propofol titrated off midway through shift and versed re-started per provider. See Mar for details. Remains proned. ABGs trended. Remains NS - ST. Levo stopped this shift and pt maintaining MAPs>65. Febrile to 38.5. Blood cultures sent to lab. Tube feeds up titrated throughout shift toward goal with minimal residuals. No BM this shift. Sufficient foley output. Wife at bedside and updated throughout shift.   Problem: Adult Inpatient Plan of Care  Goal: Plan of Care Review  Outcome: Ongoing - Unchanged  Goal: Patient-Specific Goal (Individualized)  Outcome: Ongoing - Unchanged  Goal: Absence of Hospital-Acquired Illness or Injury  Outcome: Ongoing - Unchanged  Intervention: Identify and Manage Fall Risk  Recent Flowsheet Documentation  Taken 03/09/2021 0600 by Ocie Cornfield, RN  Safety Interventions:   aspiration precautions   family at bedside  Taken 03/09/2021 0400 by Ocie Cornfield, RN  Safety Interventions: aspiration precautions  Taken 03/09/2021 0200 by Ocie Cornfield, RN  Safety Interventions:   aspiration precautions   bleeding precautions  Taken 03/09/2021 0000 by Ocie Cornfield, RN  Safety Interventions:   aspiration precautions   family at bedside  Taken 03/08/2021 2200 by Ocie Cornfield, RN  Safety Interventions:   aspiration precautions   bleeding precautions  Taken 03/08/2021 2000 by Ocie Cornfield, RN  Safety Interventions: aspiration precautions  Taken 03/08/2021 1900 by Ocie Cornfield, RN  Safety Interventions:   aspiration precautions   bleeding precautions  Intervention: Prevent and Manage VTE (Venous Thromboembolism) Risk  Recent Flowsheet Documentation  Taken 03/09/2021 0600 by Ocie Cornfield, RN  Activity Management: bedrest  Taken 03/09/2021 0400 by Ocie Cornfield, RN  Activity Management: bedrest  Taken 03/09/2021 0200 by Ocie Cornfield, RN  Activity Management: bedrest  Taken 03/09/2021 0000 by Ocie Cornfield, RN  Activity Management: bedrest  Taken 03/08/2021 2200 by Ocie Cornfield, RN  Activity Management: bedrest  Taken 03/08/2021 2000 by Ocie Cornfield, RN  Activity Management: bedrest  Intervention: Prevent Infection  Recent Flowsheet Documentation  Taken 03/09/2021 0600 by Ocie Cornfield, RN  Infection Prevention:   environmental surveillance performed   equipment surfaces disinfected   hand hygiene promoted   personal protective equipment utilized   rest/sleep promoted   single patient room provided  Taken 03/09/2021 0400 by Ocie Cornfield, RN  Infection Prevention:   environmental surveillance performed   single patient room provided  Taken 03/09/2021 0200 by Ocie Cornfield, RN  Infection Prevention:   environmental surveillance performed   equipment surfaces disinfected   hand hygiene promoted   personal protective equipment utilized   rest/sleep promoted   single patient room provided   visitors restricted/screened  Taken 03/09/2021 0000 by Ocie Cornfield, RN  Infection Prevention:   environmental surveillance performed   equipment surfaces disinfected   hand hygiene promoted   rest/sleep promoted   visitors restricted/screened  Taken 03/08/2021 2200 by Ocie Cornfield, RN  Infection Prevention:   equipment surfaces disinfected   hand hygiene promoted   personal protective equipment utilized   single patient room provided  Taken 03/08/2021 2000 by Ocie Cornfield, RN  Infection Prevention:   equipment surfaces disinfected   hand hygiene promoted   personal protective equipment utilized   rest/sleep promoted   environmental surveillance performed  Taken 03/08/2021 1900 by Ocie Cornfield, RN  Infection Prevention:   environmental surveillance performed   equipment surfaces disinfected   hand hygiene promoted   rest/sleep promoted  single patient room provided   visitors restricted/screened  Goal: Optimal Comfort and Wellbeing  Outcome: Ongoing - Unchanged  Goal: Readiness for Transition of Care  Outcome: Ongoing - Unchanged  Goal: Rounds/Family Conference  Outcome: Ongoing - Unchanged     Problem: Infection  Goal: Absence of Infection Signs and Symptoms  Outcome: Ongoing - Unchanged  Intervention: Prevent or Manage Infection  Recent Flowsheet Documentation  Taken 03/09/2021 0600 by Ocie Cornfield, RN  Infection Management: aseptic technique maintained  Isolation Precautions: droplet precautions maintained  Taken 03/09/2021 0400 by Ocie Cornfield, RN  Infection Management:   aseptic technique maintained   cultures obtained and sent to lab  Isolation Precautions: droplet precautions maintained  Taken 03/09/2021 0200 by Ocie Cornfield, RN  Infection Management: aseptic technique maintained  Isolation Precautions: droplet precautions maintained  Taken 03/09/2021 0000 by Ocie Cornfield, RN  Infection Management: aseptic technique maintained  Isolation Precautions: droplet precautions maintained  Taken 03/08/2021 2200 by Ocie Cornfield, RN  Infection Management: aseptic technique maintained  Isolation Precautions: droplet precautions maintained  Taken 03/08/2021 2000 by Ocie Cornfield, RN  Infection Management: aseptic technique maintained  Isolation Precautions: droplet precautions maintained  Taken 03/08/2021 1900 by Ocie Cornfield, RN  Infection Management: aseptic technique maintained  Isolation Precautions: droplet precautions maintained     Problem: Skin Injury Risk Increased  Goal: Skin Health and Integrity  Outcome: Ongoing - Unchanged  Intervention: Optimize Skin Protection  Recent Flowsheet Documentation  Taken 03/09/2021 0600 by Ocie Cornfield, RN  Pressure Reduction Techniques: weight shift assistance provided  Head of Bed [facility]) Positioning: reverse trandelenberg  Pressure Reduction Devices:   positioning supports utilized   pressure-redistributing mattress utilized  Taken 03/09/2021 0400 by Ocie Cornfield, RN  Head of Bed [facility]) Positioning: reverse trandelenberg  Taken 03/09/2021 0200 by Ocie Cornfield, RN  Head of Bed [facility]) Positioning: reverse trandelenberg  Taken 03/09/2021 0000 by Ocie Cornfield, RN  Head of Bed [facility]) Positioning: HOB at 30-45 degrees  Taken 03/08/2021 2200 by Ocie Cornfield, RN  Pressure Reduction Techniques: weight shift assistance provided  Head of Bed [facility]) Positioning: reverse trandelenberg  Taken 03/08/2021 2000 by Ocie Cornfield, RN  Pressure Reduction Techniques: weight shift assistance provided  Head of Bed (HOB) Positioning: reverse trandelenberg  Pressure Reduction Devices: pressure-redistributing mattress utilized     Problem: Fluid Imbalance (Pneumonia)  Goal: Fluid Balance  Outcome: Ongoing - Unchanged     Problem: Infection (Pneumonia)  Goal: Resolution of Infection Signs and Symptoms  Outcome: Ongoing - Unchanged  Intervention: Prevent Infection Progression  Recent Flowsheet Documentation  Taken 03/09/2021 0600 by Ocie Cornfield, RN  Infection Management: aseptic technique maintained  Isolation Precautions: droplet precautions maintained  Taken 03/09/2021 0400 by Ocie Cornfield, RN  Infection Management:   aseptic technique maintained   cultures obtained and sent to lab  Isolation Precautions: droplet precautions maintained  Taken 03/09/2021 0200 by Ocie Cornfield, RN  Infection Management: aseptic technique maintained  Isolation Precautions: droplet precautions maintained  Taken 03/09/2021 0000 by Ocie Cornfield, RN  Infection Management: aseptic technique maintained  Isolation Precautions: droplet precautions maintained  Taken 03/08/2021 2200 by Ocie Cornfield, RN  Infection Management: aseptic technique maintained  Isolation Precautions: droplet precautions maintained  Taken 03/08/2021 2000 by Ocie Cornfield, RN  Infection Management: aseptic technique maintained  Isolation Precautions: droplet precautions maintained  Taken 03/08/2021 1900 by Ocie Cornfield, RN  Infection Management: aseptic technique maintained  Isolation Precautions: droplet precautions maintained     Problem: Respiratory Compromise (Pneumonia)  Goal: Effective Oxygenation and  Ventilation  Outcome: Ongoing - Unchanged  Intervention: Optimize Oxygenation and Ventilation  Recent Flowsheet Documentation  Taken 03/09/2021 0600 by Ocie Cornfield, RN  Head of Bed [facility]) Positioning: reverse trandelenberg  Taken 03/09/2021 0400 by Ocie Cornfield, RN  Head of Bed [facility]) Positioning: reverse trandelenberg  Taken 03/09/2021 0200 by Ocie Cornfield, RN  Head of Bed [facility]) Positioning: reverse trandelenberg  Taken 03/09/2021 0000 by Ocie Cornfield, RN  Head of Bed [facility]) Positioning: HOB at 30-45 degrees  Taken 03/08/2021 2200 by Ocie Cornfield, RN  Head of Bed [facility]) Positioning: reverse trandelenberg  Taken 03/08/2021 2000 by Ocie Cornfield, RN  Head of Bed [facility]) Positioning: reverse trandelenberg     Problem: Pain Chronic (Persistent) (Comorbidity Management)  Goal: Acceptable Pain Control and Functional Ability  Outcome: Ongoing - Unchanged     Problem: Self-Care Deficit  Goal: Improved Ability to Complete Activities of Daily Living  Outcome: Ongoing - Unchanged     Problem: Non-Violent Restraints  Goal: Patient will remain free of restraint events  Outcome: Ongoing - Unchanged  Goal: Patient will remain free of physical injury  Outcome: Ongoing - Unchanged     Problem: Communication Impairment (Mechanical Ventilation, Invasive)  Goal: Effective Communication  Outcome: Ongoing - Unchanged     Problem: Device-Related Complication Risk (Mechanical Ventilation, Invasive)  Goal: Optimal Device Function  Outcome: Ongoing - Unchanged  Intervention: Optimize Device Care and Function  Recent Flowsheet Documentation  Taken 03/09/2021 0600 by Ocie Cornfield, RN  Aspiration Precautions: respiratory status monitored  Taken 03/09/2021 0400 by Ocie Cornfield, RN  Aspiration Precautions: respiratory status monitored  Taken 03/09/2021 0200 by Ocie Cornfield, RN  Aspiration Precautions: upright posture maintained  Taken 03/09/2021 0000 by Ocie Cornfield, RN  Aspiration Precautions: tube feeding placement verified  Taken 03/08/2021 2200 by Ocie Cornfield, RN  Aspiration Precautions:   upright posture maintained   respiratory status monitored   oral hygiene care promoted   tube feeding placement verified  Taken 03/08/2021 2000 by Ocie Cornfield, RN  Aspiration Precautions:   respiratory status monitored   tube feeding placement verified  Taken 03/08/2021 1900 by Ocie Cornfield, RN  Aspiration Precautions:   respiratory status monitored   oral hygiene care promoted     Problem: Inability to Wean (Mechanical Ventilation, Invasive)  Goal: Mechanical Ventilation Liberation  Outcome: Ongoing - Unchanged     Problem: Nutrition Impairment (Mechanical Ventilation, Invasive)  Goal: Optimal Nutrition Delivery  Outcome: Ongoing - Unchanged     Problem: Skin and Tissue Injury (Mechanical Ventilation, Invasive)  Goal: Absence of Device-Related Skin and Tissue Injury  Outcome: Ongoing - Unchanged     Problem: Ventilator-Induced Lung Injury (Mechanical Ventilation, Invasive)  Goal: Absence of Ventilator-Induced Lung Injury  Outcome: Ongoing - Unchanged  Intervention: Prevent Ventilator-Associated Pneumonia  Recent Flowsheet Documentation  Taken 03/09/2021 0600 by Ocie Cornfield, RN  Head of Bed [facility]) Positioning: reverse trandelenberg  Oral Care: suction provided  Suction Type: Oral  Taken 03/09/2021 0400 by Ocie Cornfield, RN  Head of Bed [facility]) Positioning: reverse trandelenberg  Oral Care:   mouth swabbed   suction provided  Suction Type: Oral  Taken 03/09/2021 0200 by Ocie Cornfield, RN  Head of Bed [facility]) Positioning: reverse trandelenberg  Taken 03/09/2021 0000 by Ocie Cornfield, RN  Head of Bed [facility]) Positioning: HOB at 30-45 degrees  Oral Care: mouth swabbed  Suction Type: Oral  Taken 03/08/2021 2200 by Ocie Cornfield, RN  Head of Bed [facility]) Positioning: reverse trandelenberg  Oral Care: lip/mouth moisturizer applied  Suction Type: Oral  Taken 03/08/2021 2000 by Ocie Cornfield, RN  Head of Bed [facility]) Positioning: reverse trandelenberg  Oral Care: suction provided  Suction Type: Oral

## 2021-03-09 NOTE — Unmapped (Signed)
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    Patient remains critically ill in the ICU after being intubated yesterday. He has completed 5 days of oseltamivir and his wife has agreed for him to receive IVIG today. Lower respiratory culture now positive for MRSA. He continues on linezolid. Would stop aztreonam tomorrow if there is no growth of gram negatives on respiratory culture - our suspicion for an additional gram negative bacterial superinfection is low.     Given his h/o prior MRSA empyema would recommend CT chest when stable to re-assess for empyema (no obvious effusions on CXR.) CT surgery assessed him in November and at that time believed the risks of thoracotomy outweighed the benefits.     I saw and evaluated the patient. I discussed and agree with the findings and the plan of care as documented in the fellow???s note.The patient is at risk of decompensation from infection due to underlying immunocompromise and/or mucosal barrier defects and/or presence of medical devices.   I personally reviewed updated microbiological culture and susceptibility data.  I personally reviewed updated relevant radiological studies.    Drue Second  Immunocompromised Host Infectious Diseases  Pager [phone]    IMMUNOCOMPROMISED HOST INFECTIOUS DISEASE PROGRESS NOTE      Donovan Persley is being seen in consultation at the request of [name], MD for evaluation of pneumonia.    Assessment/Recommendations:    Adam 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 krusei fungemia  - Prior chemotherapy:??GRAAPPH-2005??induction??with dasatinib??(vincristine, dexamethasone and dasatinib);??C1D1 01/24/2020; difficulty tolerating single-agent dasatinib  - Current chemotherapy:??blinatumomab (anti-CD19/CD3) + ponatinib (TKI)??s/p??C4 on??12/14/20; 01/2021 now on ponatinib monotherapy maintenance  -??01/11/21:??Normocellular bone marrow (30% overall) with trilineage hematopoiesis and less than 1% blasts by manual aspirate differential;??Flow cytometry MRD analysis reveals no definitive immunophenotypic evidence of residual B lymphoblastic leukemia;??BCR-ABL p210 transcripts were detected at a level of 0.002 IS % ratio in bone marrow.    # Hypogammaglobulimia  - 11/01/20 IgG 266 declined IVIG  - 03/06/21 IgG 148    # AKI  Cr up to 3 from b/l ~1.6-1.8, improved with fluids.  ??  Pertinent Co-morbidities  # COPD/empysema  #??DIHS/DRESS??ceftaroline??06/12/2020, relapse??after cefepime??11/02/2020  - per prior ID notes possible culprits ceftaroline,??posaconazole, dasatinib, sotrovimab  -??12/14/20??discontinued??steroids  ????  Pertinent Exposure History??  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  Infection History  Active infections:??  # Influenza infection with superimposed MRSA bacterial pneumonia 03/05/21  - 02/28/21 Fever and respiratory symptoms   - 12/2 : RPP - Flu +ve  - 12/2 : CXR - Right Lower Lobe airspace disease  - 12/3: CXR worsened opacities  -12/5: intubated for worsening hypoxemic respiratory failure with concern for ARDS  -  Patient unvaccinated for influenza.  Tx : Linezolid /Oseltamivir---> Linezolid/aztreonam/flagyl/azithromycin/oseltamivir 12/3 --> linezolid/aztreonam/osteltamivir 12/3 -->    Prior infections:  #  MRSA??RLL PNA c/b bacteremia 11/01/2020, probable right empyema 8/12/202; presumed relapsed RML MRSA pneumonia 11/18/2020  - 8/19 Failed??thoracocentesis by IP  - 9/26 CT chest parenchyma without e/o infection, persistent but decreased loculated effusions on right  - s/p MRSA antibiotics until 01/04/2021  #Persistent right chest pain 12/29/20; Low grade fever, weakness 01/28/21  - 02/02/2021 - Dr. 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  Antimicrobial Intolerance/allergy  Cefepime - DRESS  Ceftaroline -??DRESS  Dapsone - possible??agranulocytosis, per chart anaphylaxis  Vancomycin - probable ototoxicity (in combination??with furosemide)  Isavuconazole - elevated LFTs in the setting of TKI     RECOMMENDATIONS    Diagnostic  ??? Please send BAL samples for cx for aerobic/anaerobic cx , fungal and AFB cx when able.  ??? Follow-up BCx 12/3 - NGTD    Monitoring for antimicrobial toxicities  ??? While on antibiotics, monitor CBC with diff and CMP at least weekly    Treatment  ??? CONTINUE linezolid 600 mg bid for treatment of MRSA pneumonia - anticipate 10-14 day course or longer if there is evidence of empyema on CT  ??? OK to stop aztreonam 2g bid for empiric gram-negative coverage if lower respiratory cultures show no growth of gram negative at 72hrs  ??? Recommend CT chest to evaluate for pleural effusions when patient is stable (no obvious effusions on XR, but difficult to assess)  ??? Completed  oseltamivir 75 mg bid x 5 days (12/1 - 12/5) course.  ??? Recommend  IVIG today (IgG low at 148)  Initially patient declined IVIG given prior bad reaction in 08/22. We note that patient tolerated 3 X doses of IVIG previously from 05/22-07/22 every 6 weeks. We think the benefit of IVIG in this patient with hypogammaglobulinemia and serious influenza infection outweighs the risks of possible adverse reaction. We rediscussed with patient's [name]) who understands this and is agreeable/consenting to IVIG administration with appropriate premedications to avoid reactions and at a slower rate with close monitoring for adverse reactions.     MIC[facility] team notified of the plan via epic chat.    Prophylaxis  ??? Cont valacyclovir, bactrim  ??? Recommend COVID BV after discharge          The [facility] ID service will continue to follow.  Please page the ID Transplant/Liquid Oncology Fellow consult at [phone] with questions.  Patient was discussed with Dr Jimmye Norman.    Kandice Moos, MD  Infectious Diseases Fellow  [facility]    Interval HPI:      Source of information includes:  Electronic Medical Records.  History obtained from:EMR   Interval events since last encounter: remains intubated, Intermittently febrile, tmax 38.6. Pressures otherwise stable.   Wife in attendance ,  Discussed progress and IVIG.  ROS: intubated    Medications:   Antimicrobials:  Linezolid 12/2 ->  Oseltamivir 12/2 ->  Aztreonam 12/3 ->  Azithromycin 12/3  X 1   Metronidazole 12/3 x 1    Ppx: TMP/SMX, valacyclovir      Current/Prior immunomodulators:  ponatinib 30mg  monotherapy (held)  Other medications reviewed.        Vital Signs last 24 hours:  Temp:  [37.7 ??C (99.9 ??F)-38.7 ??C (101.7 ??F)] 38.5 ??C (101.3 ??F)  Heart Rate:  [95-120] 113  SpO2 Pulse:  [95-120] 113  Resp:  [16-28] 28  A BP-2: (81-141)/(49-68) 108/57  MAP:  [59 mmHg-89 mmHg] 73 mmHg  FiO2 (%):  [60 %-80 %] 60 %  SpO2:  [92 %-100 %] 97 %    Physical Exam:   Patient Lines/Drains/Airways Status     Active Active Lines, Drains, & Airways     Name Placement date Placement time Site Days    ETT  7.5 03/07/21  2131  -- 1    CVC Triple Lumen 03/08/21 Non-tunneled Right Internal jugular 03/08/21  0132  Internal jugular  1    NG/OG Tube Feedings Center mouth 03/08/21  0106  Center mouth  1    Urethral Catheter 03/07/21  2200  --  1    Peripheral IV 03/06/21 Left Arm 03/06/21  --  Arm  3    Peripheral IV 03/06/21 Anterior;Right Forearm 03/06/21  2030  Forearm  2    Peripheral IV 03/08/21 Right Antecubital 03/08/21  0102  Antecubital  1    Arterial Line 03/07/21 Right Radial 03/07/21  1500  Radial  1                Const [x]  vital signs above    []  NAD, non-toxic appearance []  Chronically ill-appearing, non-distressed  Ill-appearing,       Eyes [x]  Lids normal bilaterally, conjunctiva anicteric and noninjected OU     [x] PERRL  [] EOMI  Ill- appearing      ENMT [x]  Normal appearance of external nose and ears, no nasal discharge        []  MMM, no lesions on lips or gums [x]  No thrush, leukoplakia, oral lesions  []  Dentition good []  Edentulous []  Dental caries present  []  Hearing normal  []  TMs with good light reflexes bilaterally   Intubated and Proned      Neck [x]  Neck of normal appearance and trachea midline        []  No thyromegaly, nodules, or tenderness   []  Full neck ROM        Lymph []  No LAD in neck     []  No LAD in supraclavicular area     []  No LAD in axillae   []  No LAD in epitrochlear chains     []  No LAD in inguinal areas        CV [x]  RRR            [x]  No peripheral edema     []  Pedal pulses intact   [x]  No abnormal heart sounds appreciated   [x]  Extremities WWP         Resp []  Normal WOB at rest    []  No breathlessness with speaking, no coughing  []  CTA anteriorly    []  CTA posteriorly    Diffuse rhonchi throughout posteriorly, coarse crackles Right LLZ,      GI [x]  Normal inspection, NTND   [x]  NABS     []  No umbilical hernia on exam       []  No hepatosplenomegaly     []  Inspection of perineal and perianal areas normal        GU [x]  Normal external genitalia     [] No urinary catheter present in urethra   []  No CVA tenderness    []   No tenderness over renal allograft        MSK [x]  No clubbing or cyanosis of hands       []  No vertebral point tenderness  []  No focal tenderness or abnormalities on palpation of joints in RUE, LUE, RLE, or LLE        Skin [x]  No rashes, lesions, or ulcers of visualized skin     []  Skin warm and dry to palpation         Neuro [x]  Face expression symmetric  []  Sensation to light touch grossly intact throughout    []  Moves extremities equally    [x]  No tremor noted        []  CNs II-XII grossly intact     []  DTRs normal and symmetric throughout []  Gait unremarkable        Psych [x]  Appropriate affect       [x]  Fluent speech         []  Attentive, good eye contact  [x]  Oriented to person, place, time          []  Judgment and insight are appropriate           Data for Medical Decision Making     Recent Labs   Lab Units 03/09/21  0319 03/07/21  0027 03/06/21  1426   WBC 10*9/L 16.4*   < >  --    HEMOGLOBIN g/dL 82.9*   < >  --    HEMOGLOBIN BG   --    < >  --    PLATELET COUNT (1) 10*9/L 225   < >  --    NEUTRO ABS 10*9/L 14.3*   < >  --    LYMPHO ABS 10*9/L 0.7*   < >  --    EOSINO ABS 10*9/L 0.3   < >  --    SODIUM WHOLE BLOOD   --    < >  --    SODIUM mmol/L 132*   < > 137   POTASSIUM WHOLE BLOOD   --    < >  --    POTASSIUM mmol/L 4.3   < > 3.8   BUN mg/dL 36*   < > 24*   CREATININE mg/dL 5.62*   < > 1.30*   GLUCOSE mg/dL 865   < > 99   CALCIUM mg/dL 8.7   < > 6.9*   MAGNESIUM mg/dL 2.6   < > 2.0   PHOSPHORUS mg/dL 4.8   < >  --    BILIRUBIN TOTAL mg/dL 0.4   < >  --    AST U/L <8   < >  --    ALT U/L <7*   < >  --    IGG mg/dL  --   --  784*    < > = values in this interval not displayed.     I reviewed and noted the following labs: persistent leukocytosis at 16.4. Hgb 10.5, . Cr elevated from baseline.    Microbiology:  Past cultures were reviewed in Epic and CareEverywhere.  Review of cultures with microbiology: NA    BCx 12/3 x2: pending  UCx 122/3: pending    Imaging:  XR Chest 12/5 : Interval increase in right hemithorax consolidation. Persistent retrocardiac consolidation.No pleural effusion or pneumothorax    XR Chest 12/4: Slight increase in nonspecific left basilar opacities. Patchy and confluent heterogeneous opacities in the right upper lung are similar.      XR Chest Portable  03/06/2021    Marked interval worsening of airspace consolidation in the right upper and midlung zones. The left lung is clear.  No pleural effusion or pneumothorax.  Stable cardiomediastinal silhouette.    I have reviewed the images from 03/09/21 and agree with the findings.    Additional Studies:   (12/3) EKG QTc 427

## 2021-03-09 NOTE — Unmapped (Signed)
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 *      Interval history: Adam [name] with PMHx of ALL, COPD, prior MRSA bacteremia requiring intubation, MDD, lower back pain, tobacco use hx, presenting with??nausea, vomiting, fever initially found to be hypotensive and tachycardic at oncologists office, started on linezolid.??SOB worsened??in subsequent 24hrs??prompting presentation to the ED.??Admitted to the MICU due to c/f??acute hypoxic respiratory failure secondary to influenza infection with superimposed PNA.??    24 hr inter: Overnight patient was switched from propofol to versed given patients high TG. [facility] was returned positive for staph aureus and is suspected MRSA given history. Stopped aztreonam, continuing Linezoild. Patient did become febrile, increased tachycardiac and decreaed Bps. BCX x2 were drawn. Patient has since been switched from proning to supine and last ABG shows improvement. Patient still on VC/AC, RR 28, Vt 500 and decreeased PEEP to 10.      Neurological   Sedation while intubated  Intubated secondary to worsening hypoxia, s/p vecuronium x1. Current plan is to wean sedation  - weaned propofol gtt  - wean versed  - pain plan per below    Chronic??Pain??- Peripheral neuropathy  Has chronic pain at baseline from??ALL. Follows with [facility]. Reduced oxycodone doses in efforts to not worsen respiratory drive. Reduced home gabapentin secondary to present AKI.   - Cont home oxycodone 10mg  q 12hr SCH   - Cont home gabapentin 300mg  TID SCH  - Continue SCH: dilaudid gtt 5mg  hr  - PRNs: dilaudid 2mg , tylenol 650mg  q 6  - Hold home Amytriptyline given delirium risk, low threshold to restart    MDD  Halved home klonopin to reduce efforts in worsening respiratory drive.  - Klonopin 0.5mg  Enterally BID  - Holding nortriptyline as above  ??  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 Stable.     Pulmonary   AHRF 2/2 Influenza w/ superimposed MRSA PNA - s/p Intubation  Saturating well on 40% FiO2, initially presented to oncologist office outpatient with hypotension, tachypnea, started on linezolid. Had worsening shortness of breath over the past 24hrs per wife at bedside, prompting presentation to the ED. S/p intubation 12/5 overnight. P/F ratio less than 150 currently, will assess at 16 hours. c/f ARDS given acute onset, bilateral infiltrates, low P/F ratio, no pulm edema.   - Continue VC/AC, switched to supine  - Continue Abx as indicated below   - Droplet Precautions??  - Follow serial VBGs to ensure appropriately compensating for acidosis  - Consider CT chest if acute worsening of respiratory status, but otherwise imaging would not change current management.  ??  COPD  20 ppy tobacco use hx, followed by Pulmonology outpt. Per 01/25/21 no prior exacerbations. Utilizes home albuterol.   - O2 Goal 88-92%  - cont ANORO Ellipta  -  Holding on steroids  - ABx as listed below  - prn duonebs  ??  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 Underwent unsuccessful thoracentesis, at the time referred to CT surgery, at the time didn't think warranted surgical intervention. Futility of POC bedside US of lungs discussed, may offer little change to clinical evaluation. Can consider further imaging if acute worsening of respiratory status.  - CTM    Cardiovascular   Sinus tachycardia??2/2 Sepsis:  - continue to wean norepi     Renal   AKI  Likely prerenal. Dry on exam. S/p 1L. Creatinine cont to downtrend  - f/u BMP  - Monitor I&O  - Avoid nephrotoxins    Infectious Disease/Autoimmune   Influenza c/b MRSA PNA - Hx of MRSA Bacteremia  Leukocytosis with neutrophil predominance, febrile, myalgias. CXR reveals??alveolar process covering the entire R side, with signifcant progression from 12/2 CXR. Empirically given Linezolid/Aztreonam in the ED. 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 See infectious disease note by Dr. Cardell Peach from 03/06/21 for list of prior infections & notable antimicrobial intolerance/allergy list. MRSA screen positive, though BCx remain negative. [facility] from 12/4 is positive for MRSA. S/p Azithromycin 12/3-12/5, stopped given negative urine legionella.   - Infectious disease Immunocomp consulted, follow reccs.  - Continue Tamiflu (12/3-12/7)  - Continue Linezolid (12/3-9)  -Discontinue Aztreonam (12/3-12/6)  - follow up BCx: Neg at 72hrs  - consider CT Chest to further eval lung pathology per Heme/Onc 12/4 reccs  - peridex while intubated  - May gain diagnostic clarity, but not stable enough to obtain. Will BAL samples??for cx for aerobic/anaerobic cx , fungal and AFB cx when able  ??  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  History of DRESS syndrome??2/2 cephalosporin  - consulted ICID for reccs re: abx regimen  - Avoid beta lactams    Cultures:  Blood Culture, Routine (no units)   Date Value   03/06/2021 No Growth at 72 hours   03/06/2021 No Growth at 72 hours     Urine Culture, Comprehensive (no units)   Date Value   03/06/2021 NO GROWTH     Lower Respiratory Culture (no units)   Date Value   03/07/2021 2+ Methicillin resistant Staphylococcus aureus (A)     WBC (10*9/L)   Date Value   03/09/2021 16.4 (H)     WBC, UA (/HPF)   Date Value   03/06/2021 17 (H)              FEN/GI   Hypomagnesemia  Found to be 1.2 on admission, repleted and wnl on repeat check.  - Maintain value > 2.0  - CTM  ??  Hyponatremia - Hyperkalemic to 132 and 4.3 respectively on labs today.   - CTM    Malnutrition Assessment: Not done yet.  Body mass index is 32.05 kg/m??.  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 ??He subsequently had difficulty tolerating single agent dasatinib (as a bridge to planned ponatinib-blinatumomab--had fluid retention and pleural effusion). ??He??then completed????4 cycles??of ponatinib-blinatumomab, initiated  for treatment of MRD in the setting of poor tolerance of standard therapy.??He is now MRD-negative with BCR-ABL level of 0.002% on marrow. Now on ponatinib monotherapy  - heme on board: Recc holding home TKI until malignant hematology evals for ready to resume. Patient's family will need update to bring in home supply then.    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 Now down to 148.  - IVIG today 0.5 mg/kg   ??  DVT PPx  - subcutaneous heparin 5000 units q 8hr    Endocrine   NAI    Integumentary     #  - WOCN consulted for high risk skin assessment No. Reason: Not indicated.  - cont pressure mitigating precautions per skin policy    Prophylaxis/LDA/Restraints/Consults   Can CVC be removed? N/A, no CVC present (including vascular catheter for HD or PLEX)   Can A-line be removed? No: frequent ABGs  Can Foley be removed? No: Need continuous I/O  Mobility plan: Step 1 - Range of motion    Feeding: Tube feeds at goal  Analgesia: Pain adequately controlled  Sedation SAT/SBT: N/A  Thromboembolic ppx: SQ heparin  Head of bed >30 degrees: Yes  Ulcer ppx: Yes, mechanical ventilation > 3 days, not on tube feeds  Glucose within target range: Yes, in range    Does patient need/have an active type/screen? No    RASS at goal? Yes  [place] Agitation Assessment Scale (RASS) : -3 (03/09/2021 12:00 PM)     Can antipsychotics be stopped? N/A, not on antipsychotics  CAM-ICU Result: Negative (11/09/2020  9:00 PM)      Would hospice care be appropriate for this patient? No, patient improving or expected to improve  Any unaddressed hospice/palliative care needs? no    Patient Lines/Drains/Airways Status     Active Active Lines, Drains, & Airways     Name Placement date Placement time Site Days    ETT  7.5 03/07/21  2131  -- 1    CVC Triple Lumen 03/08/21 Non-tunneled Right Internal jugular 03/08/21  0132  Internal jugular  1    NG/OG Tube Feedings Center mouth 03/08/21  0106  Center mouth  1    Urethral Catheter 03/07/21  2200  --  1    Peripheral IV 03/06/21 Left Arm 03/06/21  --  Arm  3    Peripheral IV 03/06/21 Anterior;Right Forearm 03/06/21  2030  Forearm  2    Peripheral IV 03/08/21 Right Antecubital 03/08/21  0102  Antecubital  1    Arterial Line 03/07/21 Right Radial 03/07/21  1500  Radial  1              Patient Lines/Drains/Airways Status     Active Wounds     Name Placement date Placement time Site Days    Wound 03/09/21 Pressure Injury Nose Mid Stage 2 03/09/21  1005  Nose  less than 1                Goals of Care     Code Status: Full Code    Public relations account executive Maker:  Mr. Leeds current decisional capacity for healthcare decision-making is Full capacity. His designated Educational psychologist) is/are   HCDM (patient stated preference): Eugine, Bubb - Spouse - [phone].      Subjective     See 24 hr int    Objective     Vitals - past 24 hours  Temp:  [37.7 ??C (99.9 ??F)-38.7 ??C (101.7 ??  F)] 37.8 ??C (100 ??F)  Heart Rate:  [95-120] 102  SpO2 Pulse:  [95-120] 102  Resp:  [25-28] 28  FiO2 (%):  [40 %-100 %] 40 %  SpO2:  [92 %-100 %] 97 % Intake/Output  I/O last 3 completed shifts:  In: 4451 [P.O.:120; I.V.:1161; NG/GT:970; IV Piggyback:2200]  Out: 2680 [Urine:2680]     Physical Exam:    General:??Appears uncomfortable, shallow breathing.  HEENT:??NCAT, reports no sinus tenderness. Moist mucus membranes.  CV:??sinus tachycardia, no m/r/g. No clubbing  Pulm:??Tachypnic, coarse crackles present in R > L.??  GI:??Abdomen distended, soft, non-tender. Bowel sounds present.  MSK:??able to freely move each extremity. No rigidity. Diffuse myalgia. No LE swelling/size discrepancy.  Skin:??No apparent rash. No evident bruising.??  Neuro:??AOx3, calm, mood normal.      Continuous Infusions:   ??? HYDROmorphone 5 mg/hr (03/09/21 1200)       Scheduled Medications:   ??? carboxymethylcellulose sodium  2 drop Both Eyes Q6H SCH   ??? chlorhexidine  15 mL Topical BID   ??? famotidine  20 mg Enteral tube: gastric  At bedtime   ??? gabapentin  300 mg Oral TID   ??? heparin (porcine) for subcutaneous use  5,000 Units Subcutaneous [facility]   ??? flu vacc qs2022-23 6mos up(PF)  0.5 mL Intramuscular During hospitalization   ??? ipratropium-albuteroL  3 mL Nebulization Q6H (RT)   ??? linezolid  600 mg Intravenous Q12H [facility]   ??? LORazepam  6 mg Intravenous Q4H   ??? melatonin  3 mg Enteral tube: gastric  QPM   ??? oseltamivir  30 mg Oral BID   ??? oxyCODONE  10 mg Enteral tube: gastric  Q4H   ??? sulfamethoxazole-trimethoprim  1 tablet Oral 2 times per day on Sun Sat   ??? valACYclovir  500 mg Oral Daily       PRN medications:  acetaminophen, albuterol, HYDROmorphone, LORazepam, midazolam    Data/Imaging Review: Reviewed in Epic and personally interpreted on 03/09/2021. See EMR for detailed results.

## 2021-03-09 NOTE — Unmapped (Signed)
Patient is currently on the ventilator and settings have not changed. Patient was compliant with all inhaled scheduled medications and was under no apparent distress. Pt is proned. Swim x 3.

## 2021-03-09 NOTE — Unmapped (Signed)
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 Per EMR, patient admitted on 03/06/21 with PMHx of ALL, COPD, prior MRSA bacteremia requiring intubation, MDD, lower back pain, tobacco use hx, presenting with??nausea, vomiting, fever initially found to be hypotensive and tachycardic at oncologists office, started on linezolid.??SOB worsened??in subsequent 24hrs??prompting presentation to the ED.??Admitted to the MICU due to c/f??acute hypoxic respiratory failure secondary to influenza infection with superimposed PNA.??  Patient assessed in MICU. He is on pronation therapy. Assisted with supination.   Head to toe skin assessment completed. RT at bedside noted wound on columella during ETT securement device change. Wound is non blanching and aligns with the cross piece.   Patient repositioned with pillows and wedges and heel offloading boots in place.        03/09/21 1005   Wound 03/09/21 Pressure Injury Nose Mid Stage 2   Date First Assessed/Time First Assessed: 03/09/21 1005   Present on Hospital Admission: No  Primary Wound Type: Pressure Injury  Location: (c) Nose  Wound Location Orientation: Mid  Staging: Stage 2  Medical Device Related Pressure Injury: (c) Yes  St...   Wound Image    Dressing Status No dressing   Wound Length (cm) 0.1 cm   Wound Width (cm) 0.2 cm   Wound Depth (cm) 0.1 cm   Wound Surface Area (cm^2) 0.02 cm^2   Wound Volume (cm^3) 0.002 cm^3   Wound Bed Pink   Odor None   Peri-wound Assessment      Intact   Exudate Type      Sero-sanguineous   Exudate Amnt      Scant   Tunneling      No   Undermining     No   Treatments Cleansed/Irrigation   Dressing Foam (Non-bordered)       Continence Status:   Incontinence of bladder: Foley in place  Incontinent of bowel: incontinence pad    Moisture Associated Skin Damage:   - none noted     Lab Results   Component Value Date    WBC 16.4 (H) 03/09/2021    HGB 10.5 (L) 03/09/2021    HCT 30.6 (L) 03/09/2021    ESR 30 (H) 07/18/2020    CRP 44.0 (H) 12/28/2020    A1C 5.0 05/13/2020    GLU 113 03/09/2021    POCGLU 109 11/05/2020    ALBUMIN 2.2 (L) 03/09/2021    PROT 6.1 03/09/2021     Risk Factors:   -  Immobility  - Lack of sensory perception  - Multiple co-morbidities  - pronation therapy    Braden Scale Score: 12         Support Surface:   - Low Air Loss - ICU    Type Debridement Completed By WOCN:  N/A    Teaching:  - n/a    WOCN Recommendations:   - See nursing orders for wound care instructions.  - Contact WOCN with questions, concerns, or wound deterioration.  - continue pressure injury prevention interventions and nutrition support.  - continue to pad ETT securement and wound with mepilex XT    Topical Therapy/Interventions:   - Foam    Recommended Consults:  - Not Applicable    WOCN Follow Up:  - Weekly    Plan of Care Discussed With:   - RN primary  - LIP Med I team    Supplies Ordered: No    Workup Time:   45 minutes   Reconsult for questions/concerns/deterioration.   PAGE VIA  [facility] Health Directory  Available by Epic Chat   Renaye Rakers RN, BSN, CWOCN

## 2021-03-10 LAB — BLOOD GAS, ARTERIAL
BASE EXCESS ARTERIAL: -5 — ABNORMAL LOW (ref -2.0–2.0)
BASE EXCESS ARTERIAL: -3.5 — ABNORMAL LOW (ref -2.0–2.0)
BASE EXCESS ARTERIAL: -3 — ABNORMAL LOW (ref -2.0–2.0)
BASE EXCESS ARTERIAL: -5.3 — ABNORMAL LOW (ref -2.0–2.0)
HCO3 ARTERIAL: 20 mmol/L — ABNORMAL LOW (ref 22–27)
HCO3 ARTERIAL: 22 mmol/L (ref 22–27)
HCO3 ARTERIAL: 21 mmol/L — ABNORMAL LOW (ref 22–27)
HCO3 ARTERIAL: 19 mmol/L — ABNORMAL LOW (ref 22–27)
O2 SATURATION ARTERIAL: 94.6 % (ref 94.0–100.0)
O2 SATURATION ARTERIAL: 91.9 % — ABNORMAL LOW (ref 94.0–100.0)
O2 SATURATION ARTERIAL: 93.3 % — ABNORMAL LOW (ref 94.0–100.0)
O2 SATURATION ARTERIAL: 97.7 % (ref 94.0–100.0)
PCO2 ARTERIAL: 38.6 mmHg (ref 35.0–45.0)
PCO2 ARTERIAL: 42 mmHg (ref 35.0–45.0)
PCO2 ARTERIAL: 38.1 mmHg (ref 35.0–45.0)
PCO2 ARTERIAL: 34.6 mmHg — ABNORMAL LOW (ref 35.0–45.0)
PH ARTERIAL: 7.33 — ABNORMAL LOW (ref 7.35–7.45)
PH ARTERIAL: 7.33 — ABNORMAL LOW (ref 7.35–7.45)
PH ARTERIAL: 7.37 (ref 7.35–7.45)
PH ARTERIAL: 7.36 (ref 7.35–7.45)
PO2 ARTERIAL: 75.8 mmHg — ABNORMAL LOW (ref 80.0–110.0)
PO2 ARTERIAL: 65.4 mmHg — ABNORMAL LOW (ref 80.0–110.0)
PO2 ARTERIAL: 69.6 mmHg — ABNORMAL LOW (ref 80.0–110.0)
PO2 ARTERIAL: 105 mmHg (ref 80.0–110.0)

## 2021-03-10 LAB — CBC W/ AUTO DIFF
BASOPHILS ABSOLUTE COUNT: 0.1 10*9/L (ref 0.0–0.1)
BASOPHILS RELATIVE PERCENT: 0.8 %
EOSINOPHILS ABSOLUTE COUNT: 0 10*9/L (ref 0.0–0.5)
EOSINOPHILS RELATIVE PERCENT: 0.2 %
HEMATOCRIT: 29.9 % — ABNORMAL LOW (ref 39.0–48.0)
HEMOGLOBIN: 10 g/dL — ABNORMAL LOW (ref 12.9–16.5)
LYMPHOCYTES ABSOLUTE COUNT: 0.4 10*9/L — ABNORMAL LOW (ref 1.1–3.6)
LYMPHOCYTES RELATIVE PERCENT: 2.5 %
MEAN CORPUSCULAR HEMOGLOBIN CONC: 33.5 g/dL (ref 32.0–36.0)
MEAN CORPUSCULAR HEMOGLOBIN: 28.9 pg (ref 25.9–32.4)
MEAN CORPUSCULAR VOLUME: 86.3 fL (ref 77.6–95.7)
MEAN PLATELET VOLUME: 8.1 fL (ref 6.8–10.7)
MONOCYTES ABSOLUTE COUNT: 1.5 10*9/L — ABNORMAL HIGH (ref 0.3–0.8)
MONOCYTES RELATIVE PERCENT: 9.4 %
NEUTROPHILS ABSOLUTE COUNT: 13.8 10*9/L — ABNORMAL HIGH (ref 1.8–7.8)
NEUTROPHILS RELATIVE PERCENT: 87.1 %
PLATELET COUNT: 253 10*9/L (ref 150–450)
RED BLOOD CELL COUNT: 3.47 10*12/L — ABNORMAL LOW (ref 4.26–5.60)
RED CELL DISTRIBUTION WIDTH: 16.6 % — ABNORMAL HIGH (ref 12.2–15.2)
WBC ADJUSTED: 15.8 10*9/L — ABNORMAL HIGH (ref 3.6–11.2)

## 2021-03-10 LAB — HEPATIC FUNCTION PANEL
ALBUMIN: 2 g/dL — ABNORMAL LOW (ref 3.4–5.0)
ALKALINE PHOSPHATASE: 110 U/L (ref 46–116)
ALT (SGPT): <7 U/L — ABNORMAL LOW (ref 10–49)
AST (SGOT): <8 U/L (ref <=34)
BILIRUBIN DIRECT: 0.6 mg/dL — ABNORMAL HIGH (ref 0.00–0.30)
BILIRUBIN TOTAL: 0.8 mg/dL (ref 0.3–1.2)
PROTEIN TOTAL: 5.9 g/dL (ref 5.7–8.2)

## 2021-03-10 LAB — BASIC METABOLIC PANEL
ANION GAP: 8 mmol/L (ref 5–14)
ANION GAP: 10 mmol/L (ref 5–14)
BLOOD UREA NITROGEN: 39 mg/dL — ABNORMAL HIGH (ref 9–23)
BLOOD UREA NITROGEN: 41 mg/dL — ABNORMAL HIGH (ref 9–23)
BUN / CREAT RATIO: 14
BUN / CREAT RATIO: 14
CALCIUM: 8.6 mg/dL — ABNORMAL LOW (ref 8.7–10.4)
CALCIUM: 8.6 mg/dL — ABNORMAL LOW (ref 8.7–10.4)
CHLORIDE: 105 mmol/L (ref 98–107)
CHLORIDE: 104 mmol/L (ref 98–107)
CO2: 22 mmol/L (ref 20.0–31.0)
CO2: 22 mmol/L (ref 20.0–31.0)
CREATININE: 2.79 mg/dL — ABNORMAL HIGH
CREATININE: 2.88 mg/dL — ABNORMAL HIGH
EGFR CKD-EPI (2021) MALE: 28 mL/min/{1.73_m2} — ABNORMAL LOW (ref >=60)
EGFR CKD-EPI (2021) MALE: 27 mL/min/{1.73_m2} — ABNORMAL LOW (ref >=60)
GLUCOSE RANDOM: 114 mg/dL (ref 70–179)
GLUCOSE RANDOM: 115 mg/dL (ref 70–179)
POTASSIUM: 4.2 mmol/L (ref 3.4–4.8)
POTASSIUM: 4.2 mmol/L (ref 3.4–4.8)
SODIUM: 135 mmol/L (ref 135–145)
SODIUM: 136 mmol/L (ref 135–145)

## 2021-03-10 LAB — PHOSPHORUS: PHOSPHORUS: 4.3 mg/dL (ref 2.4–5.1)

## 2021-03-10 LAB — MAGNESIUM
MAGNESIUM: 2.5 mg/dL (ref 1.6–2.6)
MAGNESIUM: 2.4 mg/dL (ref 1.6–2.6)

## 2021-03-10 MED ADMIN — heparin (porcine) 5,000 unit/mL injection 5,000 Units: 5000 [IU] | SUBCUTANEOUS | @ 12:00:00

## 2021-03-10 MED ADMIN — diphenhydrAMINE (BENADRYL) injection: 25 mg | INTRAVENOUS | @ 18:00:00 | Stop: 2021-03-10

## 2021-03-10 MED ADMIN — melatonin tablet 3 mg: 3 mg | GASTROENTERAL | @ 23:00:00

## 2021-03-10 MED ADMIN — LORazepam (ATIVAN) injection 4 mg: 4 mg | INTRAVENOUS | @ 09:00:00

## 2021-03-10 MED ADMIN — LORazepam (ATIVAN) injection 4 mg: 4 mg | INTRAVENOUS | @ 05:00:00

## 2021-03-10 MED ADMIN — linezolid in dextrose 5% (ZYVOX) 600 mg/300 mL IVPB 600 mg: 600 mg | INTRAVENOUS | @ 14:00:00 | Stop: 2021-03-13

## 2021-03-10 MED ADMIN — HYDROmorphone 1 mg/mL in 0.9% sodium chloride: 0-10 mg/h | INTRAVENOUS | @ 03:00:00 | Stop: 2021-03-21

## 2021-03-10 MED ADMIN — gabapentin (NEURONTIN) capsule 300 mg: 300 mg | ORAL | @ 14:00:00 | Stop: 2021-03-10

## 2021-03-10 MED ADMIN — linezolid in dextrose 5% (ZYVOX) 600 mg/300 mL IVPB 600 mg: 600 mg | INTRAVENOUS | @ 02:00:00 | Stop: 2021-03-13

## 2021-03-10 MED ADMIN — ipratropium-albuteroL (DUO-NEB) 0.5-2.5 mg/3 mL nebulizer solution 3 mL: 3 mL | RESPIRATORY_TRACT | @ 21:00:00

## 2021-03-10 MED ADMIN — polyethylene glycol (MIRALAX) packet 17 g: 17 g | ORAL | @ 22:00:00

## 2021-03-10 MED ADMIN — carboxymethylcellulose sodium (THERATEARS) 0.25 % ophthalmic solution 2 drop: 2 [drp] | OPHTHALMIC | @ 23:00:00

## 2021-03-10 MED ADMIN — oxyCODONE (ROXICODONE) immediate release tablet 10 mg: 10 mg | GASTROENTERAL | @ 05:00:00 | Stop: 2021-03-23

## 2021-03-10 MED ADMIN — ipratropium-albuteroL (DUO-NEB) 0.5-2.5 mg/3 mL nebulizer solution 3 mL: 3 mL | RESPIRATORY_TRACT | @ 04:00:00

## 2021-03-10 MED ADMIN — metoclopramide (REGLAN) injection 10 mg: 10 mg | INTRAVENOUS | @ 17:00:00 | Stop: 2021-03-10

## 2021-03-10 MED ADMIN — carboxymethylcellulose sodium (THERATEARS) 0.25 % ophthalmic solution 2 drop: 2 [drp] | OPHTHALMIC | @ 12:00:00

## 2021-03-10 MED ADMIN — HYDROmorphone (PF) (DILAUDID) injection 2 mg: 2 mg | INTRAVENOUS | @ 17:00:00 | Stop: 2021-03-21

## 2021-03-10 MED ADMIN — meperidine (DEMEROL) injection 25 mg: 25 mg | INTRAVENOUS | @ 20:00:00 | Stop: 2021-03-24

## 2021-03-10 MED ADMIN — LORazepam (ATIVAN) injection 4 mg: 4 mg | INTRAVENOUS | @ 20:00:00

## 2021-03-10 MED ADMIN — oxyCODONE (ROXICODONE) immediate release tablet 10 mg: 10 mg | GASTROENTERAL | @ 17:00:00 | Stop: 2021-03-23

## 2021-03-10 MED ADMIN — oxyCODONE (ROXICODONE) immediate release tablet 10 mg: 10 mg | GASTROENTERAL | @ 09:00:00 | Stop: 2021-03-23

## 2021-03-10 MED ADMIN — acetaminophen (TYLENOL) tablet 650 mg: 650 mg | ORAL | @ 18:00:00 | Stop: 2021-03-10

## 2021-03-10 MED ADMIN — carboxymethylcellulose sodium (THERATEARS) 0.25 % ophthalmic solution 2 drop: 2 [drp] | OPHTHALMIC | @ 05:00:00

## 2021-03-10 MED ADMIN — famotidine (PEPCID) tablet 20 mg: 20 mg | GASTROENTERAL | @ 02:00:00

## 2021-03-10 MED ADMIN — LORazepam (ATIVAN) injection 4 mg: 4 mg | INTRAVENOUS | @ 14:00:00

## 2021-03-10 MED ADMIN — senna (SENOKOT) tablet 2 tablet: 2 | ORAL | @ 09:00:00 | Stop: 2021-03-10

## 2021-03-10 MED ADMIN — HYDROmorphone (PF) (DILAUDID) injection 2 mg: 2 mg | INTRAVENOUS | @ 04:00:00 | Stop: 2021-03-21

## 2021-03-10 MED ADMIN — HYDROmorphone (PF) (DILAUDID) injection 2 mg: 2 mg | INTRAVENOUS | @ 20:00:00 | Stop: 2021-03-21

## 2021-03-10 MED ADMIN — immun glob G(IgG)-pro-IgA 0-50 (PRIVIGEN) 10 % intravenous solution 40 g: .5 g/kg | INTRAVENOUS | @ 19:00:00 | Stop: 2021-03-10

## 2021-03-10 MED ADMIN — chlorhexidine (PERIDEX) 0.12 % solution 15 mL: 15 mL | TOPICAL | @ 14:00:00

## 2021-03-10 MED ADMIN — LORazepam (ATIVAN) injection 2 mg: 2 mg | INTRAVENOUS | @ 20:00:00

## 2021-03-10 MED ADMIN — diphenhydrAMINE (BENADRYL) injection 25 mg: 25 mg | INTRAVENOUS | @ 20:00:00

## 2021-03-10 MED ADMIN — carboxymethylcellulose sodium (THERATEARS) 0.25 % ophthalmic solution 2 drop: 2 [drp] | OPHTHALMIC | @ 17:00:00

## 2021-03-10 MED ADMIN — acetaminophen (TYLENOL) tablet 650 mg: 650 mg | ORAL | @ 14:00:00

## 2021-03-10 MED ADMIN — LORazepam (ATIVAN) injection 4 mg: 4 mg | INTRAVENOUS | @ 02:00:00

## 2021-03-10 MED ADMIN — ipratropium-albuteroL (DUO-NEB) 0.5-2.5 mg/3 mL nebulizer solution 3 mL: 3 mL | RESPIRATORY_TRACT | @ 16:00:00

## 2021-03-10 MED ADMIN — heparin (porcine) 5,000 unit/mL injection 5,000 Units: 5000 [IU] | SUBCUTANEOUS | @ 04:00:00

## 2021-03-10 MED ADMIN — oseltamivir (TAMIFLU) capsule 30 mg: 30 mg | ORAL | @ 14:00:00 | Stop: 2021-03-10

## 2021-03-10 MED ADMIN — polyethylene glycol (MIRALAX) packet 17 g: 17 g | ORAL | @ 14:00:00 | Stop: 2021-03-10

## 2021-03-10 MED ADMIN — HYDROmorphone 1 mg/mL in 0.9% sodium chloride: 0-10 mg/h | INTRAVENOUS | @ 14:00:00 | Stop: 2021-03-10

## 2021-03-10 MED ADMIN — heparin (porcine) 5,000 unit/mL injection 5,000 Units: 5000 [IU] | SUBCUTANEOUS | @ 19:00:00

## 2021-03-10 MED ADMIN — oseltamivir (TAMIFLU) capsule 30 mg: 30 mg | ORAL | @ 02:00:00 | Stop: 2021-03-10

## 2021-03-10 MED ADMIN — oxyCODONE (ROXICODONE) immediate release tablet 10 mg: 10 mg | GASTROENTERAL | @ 22:00:00 | Stop: 2021-03-23

## 2021-03-10 MED ADMIN — gabapentin (NEURONTIN) capsule 300 mg: 300 mg | ORAL | @ 19:00:00 | Stop: 2021-03-10

## 2021-03-10 MED ADMIN — oxyCODONE (ROXICODONE) immediate release tablet 10 mg: 10 mg | GASTROENTERAL | @ 02:00:00 | Stop: 2021-03-23

## 2021-03-10 MED ADMIN — oxyCODONE (ROXICODONE) immediate release tablet 10 mg: 10 mg | GASTROENTERAL | @ 14:00:00 | Stop: 2021-03-23

## 2021-03-10 MED ADMIN — metoclopramide (REGLAN) injection 10 mg: 10 mg | INTRAVENOUS | @ 12:00:00 | Stop: 2021-03-10

## 2021-03-10 MED ADMIN — LORazepam (ATIVAN) injection 4 mg: 4 mg | INTRAVENOUS | @ 17:00:00

## 2021-03-10 MED ADMIN — gabapentin (NEURONTIN) capsule 300 mg: 300 mg | ORAL | @ 02:00:00

## 2021-03-10 MED ADMIN — valACYclovir (VALTREX) tablet 500 mg: 500 mg | ORAL | @ 14:00:00

## 2021-03-10 MED ADMIN — bisacodyL (DULCOLAX) suppository 10 mg: 10 mg | RECTAL | @ 22:00:00 | Stop: 2021-03-10

## 2021-03-10 NOTE — Unmapped (Signed)
Immunocompromised Host ID Attending Addendum  Patient persistently febrile and remains critically ill in the ICU with flu c/b ARDS and MRSA PNA. His vent settings have improved slightly with most recent FiO2 down to 50% and he remains HDS off pressors. Repeat blood cultures obtained yesterday morning remain negative to date.     For flu he is completing 5 days of oseltamivir today and will also receive IVIG today. Unclear how much of his critical illness is due to flu vs MRSA superinfection. We will re-check for flu today - if positive would continue oseltamivir for an additional 5 days (or until significant clinical improvement) given his critical illness and h/o immunosuppression.     He continues on linezolid for his MRSA PNA - no further growth from lower respiratory cultures.     It is certainly possible that his ongoing fevers are 2/2 severe flu and MRSA PNA. However, given his h/o prior loculated fluid collections recommend U/S of the chest wall of CT to assess for empyema.     We will continue to follow closely.     I saw and evaluated the patient. I discussed and agree with the findings and the plan of care as documented in the fellow???s note.The patient is at risk of decompensation from infection due to underlying immunocompromise and/or mucosal barrier defects and/or presence of medical devices.   I personally reviewed updated microbiological culture and susceptibility data.  I personally reviewed updated relevant radiological studies.    Drue Second  Immunocompromised Host Infectious Diseases  Pager [phone]       IMMUNOCOMPROMISED HOST INFECTIOUS DISEASE PROGRESS NOTE      Westen Dinino is being seen in consultation at the request of [name], MD for evaluation of pneumonia.    Assessment/Recommendations:    Kemper Heupel is a 40 y.o. 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 Patient received linezolid, aztreonam and oseltamavir ( completed course 12/7)  since admission . Given the cultures did not grow gram negative pathogen aztreonam is stopped on 12/6 and he continues on linezolid. We are concerned that his persistent fevers may be secondary to a MRSA pulmonary abscess/ empyema, and recommend when possible for CT chest to evaluate this further.     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 krusei fungemia  - Prior chemotherapy: [id number] induction with dasatinib (vincristine, dexamethasone and dasatinib); C1D1 01/24/2020; difficulty tolerating single-agent dasatinib  - Current chemotherapy: blinatumomab (anti-CD19/CD3) + ponatinib (TKI) s/p C4 on 12/14/20; 01/2021 now on ponatinib monotherapy maintenance  - 01/11/21: Normocellular bone marrow (30% overall) with trilineage hematopoiesis and less than 1% blasts by manual aspirate differential; Flow cytometry MRD analysis reveals no definitive immunophenotypic evidence of residual B lymphoblastic leukemia; BCR-ABL p210 transcripts were detected at a level of 0.002 IS % ratio in bone marrow.    # Hypogammaglobulimia  - 11/01/20 IgG 266 declined IVIG  - 03/06/21 IgG 148    # AKI  Cr up to 3 from b/l ~1.6-1.8, improved with fluids.     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  - 12/6 tracheal aspirate: MRSA  Tx : Linezolid /Oseltamivir---> Linezolid/aztreonam/flagyl/azithromycin/oseltamivir 12/3 --> linezolid/aztreonam/osteltamivir 12/3 -->linezolid [name] 12/5--->linezolid 12/6    Prior infections:  #MRSA RLL PNA c/b bacteremia 11/01/2020, probable right empyema 8/12/202; presumed relapsed RML MRSA pneumonia 11/18/2020  - 8/19 Failed thoracocentesis by IP  - 9/26 CT chest parenchyma without e/o infection, persistent but decreased loculated effusions on right  - s/p MRSA antibiotics until 01/04/2021  #Persistent right chest pain 12/29/20; Low grade fever, weakness 01/28/21  - 02/02/2021 - Dr. 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 Follow-up BCx 12/3 - NGTD  ??? Recommend CT chest to evaluate for pleural effusions/empyema when patient is stable (no obvious effusions on XR, but difficult to assess)   ??? Please repeat viral panel for flu - will extend oseltamivir dosing (for an additional 5 days or until clinical improvement) if the patient is still positive     Monitoring for antimicrobial toxicities  ??? While on antibiotics, monitor CBC with diff and CMP at least weekly    Treatment  ??? CONTINUE linezolid 600 mg bid for treatment of MRSA pneumonia - anticipate 10-14 day course or longer if there is evidence of empyema on CT  ??? Completed Oseltamavir 75 mg  Bid would continue for now and obtain repeat viral panel and extend for a 10 day course if still positive     Prophylaxis  ??? Cont valacyclovir, bactrim  ??? Recommend COVID BV after discharge          The [facility] ID service will continue to follow.  Please page the ID Transplant/Liquid Oncology Fellow consult at [phone] with questions.  Patient was discussed with Dr Jimmye Norman.    Kandice Moos, MD  Infectious Diseases Fellow  [facility]    Interval HPI:      Source of information includes:  Electronic Medical Records.  History obtained from: EMR   Interval events since last encounter: remains intubated, FiO2 70%,  Intermittently febrile, tmax 38.6. Pressures otherwise stable.   Wife in attendance , discussed progress and offered reassurance.  ROS: intubated    Medications:   Antimicrobials:  Linezolid 12/3 ->  Daptomycin 12/3 X 1  Oseltamivir 12/2 ->12/7  Aztreonam 12/3 -> 12/6  Azithromycin 12/3 -12/4   Metronidazole 12/3 x 1    Ppx: TMP/SMX, valacyclovir      Current/Prior immunomodulators:  ponatinib 30mg  monotherapy (held)    Other medications reviewed.        Vital Signs last 24 hours:  Core Temp:  [37.9 ??C (100.2 ??F)-39.1 ??C (102.4 ??F)] 38.1 ??C (100.6 ??F)  Heart Rate:  [109-120] 109  SpO2 Pulse:  [110-120] 110  Resp:  [20-28] 28  A BP-2: (108-148)/(60-68) 120/60  MAP:  [75 mmHg-87 mmHg] 75 mmHg  FiO2 (%):  [50 %-100 %] 50 %  SpO2:  [92 %-99 %] 95 %    Physical Exam:   Patient Lines/Drains/Airways Status       Active Active Lines, Drains, & Airways       Name Placement date Placement time Site Days    ETT  7.5 03/07/21  2131  -- 2    CVC Triple Lumen 03/08/21 Non-tunneled Right Internal jugular 03/08/21  0132  Internal jugular  2    NG/OG Tube Feedings Center mouth 03/08/21  0106  Center mouth  2    Urethral Catheter 03/07/21  2200  --  2    Peripheral IV 03/06/21 Left Arm 03/06/21  --  Arm  4    Peripheral IV 03/06/21 Anterior;Right Forearm 03/06/21  2030  Forearm  3    Peripheral IV 03/08/21 Right Antecubital 03/08/21  0102  Antecubital  2    Arterial Line 03/07/21 Right Radial 03/07/21  1500  Radial  2                    Const [x]  vital signs above    []  NAD, non-toxic appearance []  Chronically ill-appearing, non-distressed  Ill-appearing,       Eyes [x]  Lids normal bilaterally, conjunctiva anicteric and noninjected OU     [x] PERRL  [] EOMI  Ill- appearing      ENMT [x]  Normal appearance of external nose and ears, no nasal discharge        []  MMM, no lesions on lips or gums [x]  No thrush, leukoplakia, oral lesions  []  Dentition good []  Edentulous []  Dental caries present  []  Hearing normal  []  TMs with good light reflexes bilaterally   Intubated and Proned      Neck [x]  Neck of normal appearance and trachea midline        []  No thyromegaly, nodules, or tenderness   []  Full neck ROM        Lymph []  No LAD in neck     []  No LAD in supraclavicular area     []  No LAD in axillae   []  No LAD in epitrochlear chains     []  No LAD in inguinal areas        CV [x]  RRR            [x]  No peripheral edema     []  Pedal pulses intact   [x]  No abnormal heart sounds appreciated   [x]  Extremities WWP         Resp []  Normal WOB at rest    []  No breathlessness with speaking, no coughing  []  CTA anteriorly    []  CTA posteriorly    Diffuse rhonchi throughout posteriorly, coarse crackles Right LLZ,      GI [x]  Normal inspection, NTND   [x]  NABS     []  No umbilical hernia on  exam []  No hepatosplenomegaly     []  Inspection of perineal and perianal areas normal        GU [x]  Normal external genitalia     [] No urinary catheter present in urethra   []  No CVA tenderness    []  No tenderness over renal allograft        MSK [x]  No clubbing or cyanosis of hands       []  No vertebral point tenderness  []  No focal tenderness or abnormalities on palpation of joints in RUE, LUE, RLE, or LLE        Skin [x]  No rashes, lesions, or ulcers of visualized skin     []  Skin warm and dry to palpation         Neuro [x]  Face expression symmetric  []  Sensation to light touch grossly intact throughout    []  Moves extremities equally    [x]  No tremor noted        []  CNs II-XII grossly intact     []  DTRs normal and symmetric throughout []  Gait unremarkable        Psych [x]  Appropriate affect       [x]  Fluent speech         []  Attentive, good eye contact  [x]  Oriented to person, place, time          []  Judgment and insight are appropriate           Data for Medical Decision Making     Recent Labs   Lab Units 03/10/21  0348 03/07/21  0027 03/06/21  1426   WBC 10*9/L 15.8*   < >  --    HEMOGLOBIN g/dL 29.5*   < >  --    HEMOGLOBIN BG   --    < >  --    PLATELET COUNT (1) 10*9/L 253   < >  --    NEUTRO ABS 10*9/L 13.8*   < >  --    LYMPHO ABS 10*9/L 0.4*   < >  --    EOSINO ABS 10*9/L 0.0   < >  --    SODIUM WHOLE BLOOD   --    < >  --    SODIUM mmol/L 135   < > 137   POTASSIUM WHOLE BLOOD   --    < >  --    POTASSIUM mmol/L 4.2   < > 3.8   BUN mg/dL 39*   < > 24*   CREATININE mg/dL 6.21*   < > 3.08*   GLUCOSE mg/dL 657   < > 99   CALCIUM mg/dL 8.6*   < > 6.9*   MAGNESIUM mg/dL 2.5   < > 2.0   PHOSPHORUS mg/dL 4.3   < >  --    BILIRUBIN TOTAL mg/dL 0.8   < >  --    AST U/L <8   < >  --    ALT U/L <7*   < >  --    IGG mg/dL  --   --  846*    < > = values in this interval not displayed.     I reviewed and noted the following labs: persistent leukocytosis at 15.8. Hgb 10.0, . Cr elevated 2.79 from baseline.    Microbiology:  Past cultures were reviewed in Epic and CareEverywhere.  Microbiology Results (last day)       Procedure Component Value Date/Time Date/Time    Blood Culture [[id number]]  (Normal)  Collected: 03/06/21 0935    Lab Status: Preliminary result Specimen: Blood from 1 Peripheral Draw Updated: 03/10/21 1000     Blood Culture, Routine No Growth at 4 days    Blood Culture [[id number]]  (Normal) Collected: 03/06/21 0935    Lab Status: Preliminary result Specimen: Blood from 1 Peripheral Draw Updated: 03/10/21 1000     Blood Culture, Routine No Growth at 4 days    Lower Respiratory Culture [[id number]]  (Abnormal)  (Susceptibility) Collected: 03/07/21 2148    Lab Status: Preliminary result Specimen: SPUTUM EXPECTORATED Updated: 03/10/21 0726     Lower Respiratory Culture 2+ Methicillin resistant Staphylococcus aureus     Gram Stain <10  Epithelial cells per low power field      >25 PMNS/LPF      1+ Gram positive cocci      Acceptable for culture    Narrative:      Specimen Source: SPUTUM EXPECTORATED    Susceptibility       Methicillin resistant Staphylococcus aureus (1)       Antibiotic Interpretation Microscan Method Status    Nafcillin Resistant  KIRBY BAUER Preliminary     Methicillin-resistant staphylococci are resistant to all currently available beta-lactam antibiotics EXCEPT ceftaroline.Contact Micro lab at [phone] to request ceftaroline susceptibility testing.       Erythromycin Resistant  KIRBY BAUER Preliminary    Clindamycin Resistant  KIRBY BAUER Preliminary    Doxycycline Susceptible  KIRBY BAUER Preliminary    Gentamicin Susceptible  KIRBY BAUER Preliminary     Gentamicin is used only in combination with other active agents that test susceptible       Linezolid Susceptible  KIRBY BAUER Preliminary    Trimethoprim + Sulfamethoxazole Susceptible  KIRBY BAUER Preliminary    Vancomycin Susceptible 2 MIC SUSCEPTIBILITY RESULT Preliminary    Fluoroquinolone No Interpretation  KIRBY BAUER Preliminary     Fluoroquinolones are not indicated for the treatment of staphylococcal infections, including MRSA.                           Blood Culture #1 [[id number]]  (Normal) Collected: 03/09/21 0458    Lab Status: Preliminary result Specimen: Blood from 1 Peripheral Draw Updated: 03/10/21 0545     Blood Culture, Routine No Growth at 24 hours    Blood Culture #2 [[id number]]  (Normal) Collected: 03/09/21 0458    Lab Status: Preliminary result Specimen: Blood from 1 Peripheral Draw Updated: 03/10/21 0545     Blood Culture, Routine No Growth at 24 hours            Imaging:  XR Chest 12/7  Similar patchy and confluent consolidation in the right lung, greatest in the mid zone. Persistent left lower lung consolidation.   Small right pleural effusion. No pneumothorax.  Stable cardiomediastinal silhouette.     XR Chest 12/5 : Interval increase in right hemithorax consolidation. Persistent retrocardiac consolidation.No pleural effusion or pneumothorax    XR Chest 12/4: Slight increase in nonspecific left basilar opacities. Patchy and confluent heterogeneous opacities in the right upper lung are similar.      XR Chest Portable 03/06/2021    Marked interval worsening of airspace consolidation in the right upper and midlung zones. The left lung is clear.  No pleural effusion or pneumothorax.  Stable cardiomediastinal silhouette.    I have reviewed the images from 03/10/21 and agree with the findings.    Additional Studies:   (12/3) EKG QTc 427

## 2021-03-10 NOTE — Unmapped (Signed)
[facility] Nightshift Note     Date of Service: 03/09/2021    Principal Problem:    Acute respiratory failure with hypoxia (CMS-HCC)  Active Problems:    AKI (acute kidney injury) (CMS-HCC)    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)    Hypomagnesemia    Dyspnea    Moderate episode of recurrent major depressive disorder (CMS-HCC)    Fever    Immunocompromised (CMS-HCC)    Pneumonia    Low back pain    Sepsis (CMS-HCC)    Influenza A    Bacterial lobar pneumonia    CKD (chronic kidney disease)    Pneumonia of right lung due to methicillin resistant Staphylococcus aureus (MRSA) (CMS-HCC)  Resolved Problems:    * No resolved hospital problems. *          Cross cover summary     ABG revealed PO2 of 60, FiO2 increased to 70%, repeat PO2 stable at 85.  Did not need to prone.  Residuals noted to be 1000, stopped feeds.  Suspect secondary to Dilaudid.  Added metoclopramide, senna, MiraLAX.  Chest x-ray ordered to assess ET tube demonstrates in place s/p supination.  Also demonstrates improvement of consolidation.    Juanda Chance, MD

## 2021-03-10 NOTE — Unmapped (Cosign Needed)
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 *      Interval history: Adam [name] with PMHx of ALL, COPD, prior MRSA bacteremia requiring intubation, MDD, lower back pain, tobacco use hx, presenting with??nausea, vomiting, fever initially found to be hypotensive and tachycardic at oncologists office, started on linezolid.??SOB worsened??in subsequent 24hrs??prompting presentation to the ED.??Admitted to the MICU due to c/f??acute hypoxic respiratory failure secondary to influenza infection with superimposed PNA.??    24 hr inter: Patient unable to make BM, residuals noted to be at . Tube feeds stopped. Satrted bowel reg of Reglan, senna, and miralax. Chest x-ray showed properly placement after transfer to supine. Patient had reaction to IVIG, patient pretreated with benadryl and tylenol. Patient became hypertensive to 230s, tachycardic to 130s and developed rigors. Stopped infusion and patient returned to normal.     Neurological   Sedation while intubated  Intubated secondary to worsening hypoxia, s/p vecuronium x1. Current plan is to wean sedation. Patient has only became agitated once in last 24 hours, will continue diluadid gtt 4 mg to achieve RASS goal of -1 to -2  - stop propafol  - pain plan per below    Chronic??Pain??- Peripheral neuropathy  Has chronic pain at baseline from??ALL. Follows with [facility]. Reduced oxycodone doses in efforts to not worsen respiratory drive. Reduced home gabapentin secondary to present AKI.   - Cont home oxycodone 10mg  q 12hr SCH   - Cont home gabapentin 300mg  TID SCH  - Continue SCH: dilaudid 4 gtt mg hr  - PRNs: dilaudid 2mg , tylenol 650mg  q 6  - Hold home Amytriptyline given delirium risk, low threshold to restart    MDD  Halved home klonopin to reduce efforts in worsening respiratory drive.  - Klonopin 0.5mg  Enterally BID  - Holding nortriptyline as above  ??  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 Stable.     Pulmonary   AHRF 2/2 Influenza w/ superimposed MRSA PNA - s/p Intubation  S/p intubation 12/5 overnight. Patient previously proned, now transferred to supine. Patient FiO2 continues to decrease to 50% from 70% overnight. P/F ratio less than 150 currently, will assess at 16 hours. c/f ARDS given acute onset, bilateral infiltrates, low P/F ratio still in, no pulm edema.   - Continue VC/AC  - Continue Abx as indicated below   - Droplet Precautions??  - Follow serial VBGs to ensure appropriately compensating for acidosis  - Consider CT chest if acute worsening of respiratory status, but otherwise imaging would not change current management.  ??  COPD  20 ppy tobacco use hx, followed by Pulmonology outpt. Per 01/25/21 no prior exacerbations. Utilizes home albuterol.   - O2 Goal 88-92%  -  cont ANORO Ellipta  - Holding on steroids  - ABx as listed below  - prn duonebs  ??  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 Underwent unsuccessful thoracentesis, at the time referred to CT surgery, at the time didn't think warranted surgical intervention. Futility of POC bedside US of lungs discussed, may offer little change to clinical evaluation. Can consider further imaging if acute worsening of respiratory status.  - CTM    Cardiovascular   Sinus tachycardia??2/2 Sepsis:  - continue to wean norepi     Renal   AKI  Likely prerenal. Dry on exam. S/p 1L. Creatinine cont to downtrend  - f/u BMP  - Monitor I&O  - Avoid nephrotoxins    Infectious Disease/Autoimmune   Influenza c/b MRSA PNA - Hx of MRSA Bacteremia  Leukocytosis with neutrophil predominance, febrile, myalgias. CXR reveals??alveolar process covering the entire R side, with signifcant progression from 12/2 CXR. Empirically given Linezolid/Aztreonam in the ED. 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 See infectious disease note by Dr. Cardell Peach from 03/06/21 for list of prior infections & notable antimicrobial intolerance/allergy list. MRSA screen positive, though BCx remain negative. [facility] from 12/4 is positive for MRSA. S/p Azithromycin 12/3-12/5, stopped given negative urine legionella.   - Infectious disease Immunocomp consulted, follow reccs.  - Continue Tamiflu (12/3-12/7)  - Continue Linezolid (12/3-9)  -Discontinue Aztreonam (12/3-12/6)  - follow up BCx: Neg at 96 hrs and 12/6 Neg at 24 hr  - consider CT Chest to further eval lung pathology per Heme/Onc 12/4 reccs    ??  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  History of DRESS syndrome??2/2 cephalosporin  - consulted ICID for reccs re: abx regimen  - Avoid beta lactams    Cultures:  Blood Culture, Routine (no units)   Date Value   03/09/2021 No Growth at 24 hours   03/09/2021 No Growth at 24 hours     Urine Culture, Comprehensive (no units)   Date Value   03/06/2021 NO GROWTH     Lower Respiratory Culture (no units)   Date Value   03/07/2021 2+ Methicillin resistant Staphylococcus aureus (A)     WBC (10*9/L)   Date Value   03/10/2021 15.8 (H)     WBC, UA (/HPF)   Date Value   03/06/2021 17 (H)              FEN/GI   Hypomagnesemia  Found to be 1.2 on admission, repleted and wnl on repeat check.  - Maintain value > 2.0  - CTM  ??  Hyponatremia - Hyperkalemic to 135 and 4.2 respectively on labs today.   - CTM    Constipation-Possible residuals from tube feeds  Patient has not made BM for several days. Given patient's current status will decrease tube feeds to 65ml/hr. Started bowel regimen of Miralax BID, senna, and dulcolax. Reglan started to bowel motility.  -start miralax BID  -Start senna 2 tablets BID  -Daily dulcolax until regular BM are made.       Malnutrition Assessment: Not done yet.  Body mass index is 32.06 kg/m??.  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 ??He subsequently had difficulty tolerating single agent dasatinib. S/p 4 cycles of ponatinib-blinatumomab.??He is now MRD-negative and on ponatinib monotherapy  - heme on board: Recc holding home TKI until malignant hematology evals for ready to resume. Patient's family will need update to bring in home supply then.    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 Now down to 148.  - IVIG today 0.5 mg/kg   ??  DVT PPx  - subcutaneous heparin 5000 units q 8hr    Endocrine   NAI    Integumentary     #  - WOCN consulted for high risk skin assessment No. Reason: Not indicated.  - cont pressure mitigating precautions per skin policy    Prophylaxis/LDA/Restraints/Consults   Can CVC be removed? N/A, no CVC present (including vascular catheter for HD or PLEX)   Can A-line be removed? No: frequent ABGs  Can Foley be removed? No: Need continuous I/O  Mobility plan: Step 1 - Range of motion    Feeding: Tube feeds at goal  Analgesia: Pain adequately controlled  Sedation SAT/SBT: N/A  Thromboembolic ppx: SQ heparin  Head of bed >30 degrees: Yes  Ulcer ppx: Yes, mechanical ventilation > 3 days, not on tube feeds  Glucose within target range: Yes, in range    Does patient need/have an active type/screen? No    RASS at goal? Yes  [place] Agitation Assessment Scale (RASS) : -2 (03/10/2021  2:00 PM)     Can antipsychotics be stopped? N/A, not on antipsychotics  CAM-ICU Result: Negative (11/09/2020  9:00 PM)      Would hospice care be appropriate for this patient? No, patient improving or expected to improve  Any unaddressed hospice/palliative care needs? no    Patient Lines/Drains/Airways Status     Active Active Lines, Drains, & Airways     Name Placement date Placement time Site Days    ETT  7.5 03/07/21  2131  -- 2    CVC Triple Lumen 03/08/21 Non-tunneled Right Internal jugular 03/08/21  0132  Internal jugular  2    NG/OG Tube Feedings Center mouth 03/08/21  0106  Center mouth  2    Urethral Catheter 03/07/21  2200  --  2    Peripheral IV 03/06/21 Left Arm 03/06/21  --  Arm  4    Peripheral IV 03/06/21 Anterior;Right Forearm 03/06/21  2030  Forearm  3    Peripheral IV 03/08/21 Right Antecubital 03/08/21  0102  Antecubital  2    Arterial Line 03/07/21 Right Radial 03/07/21  1500  Radial  2              Patient Lines/Drains/Airways Status     Active Wounds     Name Placement date Placement time Site Days    Wound 03/09/21 Pressure Injury Nose Mid Stage 2 03/09/21  1005  Nose  1                Goals of Care     Code Status: Full Code    Designated Healthcare Decision Maker:  Mr. Pokorny current decisional capacity for healthcare decision-making is Full capacity. His designated Educational psychologist) is/are   HCDM (patient stated preference): [place], [place] - Spouse - [phone].      Subjective     See 24 hr int    Objective     Vitals -  past 24 hours  Temp:  [38.6 ??C (101.5 ??F)-39.1 ??C (102.4 ??F)] 39.1 ??C (102.4 ??F)  Heart Rate:  [111-122] 111  SpO2 Pulse:  [110-122] 110  Resp:  [20-28] 28  FiO2 (%):  [40 %-100 %] 50 %  SpO2:  [92 %-99 %] 95 % Intake/Output  I/O last 3 completed shifts:  In: 3845.4 [I.V.:640.4; NG/GT:2005; IV Piggyback:1200]  Out: 3320 [Urine:2820; Emesis/NG output:500]     Physical Exam:    General:??Appears comfortable, sedated in NAD   HEENT:??NCAT, reports no sinus tenderness. Moist mucus membranes.  CV:??sinus tachycardia, no m/r/g. No clubbing  Pulm:??Ventilator breath sounds  GI:??Abdomen distended, soft, non-tender. Bowel sounds present.  MSK:??able to freely move each extremity. No rigidity. Diffuse myalgia. No LE swelling/size discrepancy.  Skin:??No apparent rash. No evident bruising.??  Neuro:??Sedated, not able to follow commands or track.      Continuous Infusions:   ??? HYDROmorphone 4 mg/hr (03/10/21 1400)   ??? sodium chloride         Scheduled Medications:   ??? carboxymethylcellulose sodium  2 drop Both Eyes Q6H SCH   ??? chlorhexidine  15 mL Topical BID   ??? famotidine  20 mg Enteral tube: gastric  At bedtime   ??? gabapentin  300 mg Oral TID   ??? heparin (porcine) for subcutaneous use  5,000 Units Subcutaneous [facility]   ??? flu vacc qs2022-23 6mos up(PF)  0.5 mL Intramuscular During hospitalization   ??? ipratropium-albuteroL  3 mL Nebulization Q6H (RT)   ??? linezolid  600 mg Intravenous Q12H [facility]   ??? LORazepam  4 mg Intravenous Q4H   ??? melatonin  3 mg Enteral tube: gastric  QPM   ??? metoclopramide  10 mg Intravenous Q6H SCH   ??? oseltamivir  30 mg Oral BID   ??? oxyCODONE  10 mg Enteral tube: gastric  Q4H   ??? polyethylene glycol  17 g Oral BID   ??? senna  2 tablet Oral BID   ??? sulfamethoxazole-trimethoprim  1 tablet Oral 2 times per day on Sun Sat   ??? valACYclovir  500 mg Oral Daily       PRN medications:  acetaminophen, albuterol, Implement **AND** Care order/instruction **AND** Vital signs **AND** sodium chloride **AND** sodium chloride 0.9% **AND** diphenhydrAMINE **AND** famotidine **AND** meperidine **AND** methylPREDNISolone sodium succinate (PF) **AND** EPINEPHrine, haloperidol lactate, HYDROmorphone, LORazepam    Data/Imaging Review: Reviewed in Epic and personally interpreted on 03/10/2021. See EMR for detailed results.

## 2021-03-10 NOTE — Unmapped (Signed)
Malignant Hematology Consult Note    Requesting Attending Physician :  [name], MD  Service Requesting Consult : Medical ICU (MDI)  Reason for Consult: B-ALL  Primary Oncologist: Dr Senaida Ores    Assessment: Adam Keith is a 40 y.o. 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 Malignant hematology was consulted for evaluation of whether to continue TKI for B-ALL.     He is s/p induction per GRAAPH-2005. The course was complicated by septic shock, candida krusei fungemia, MRSA bacteremia with septic emboli c/b acute renal failure and respiratory distress requiring dialysis and intubation. ??Post-induction bmbx (day 29) demonstrated flow-based MRD-negative remission but low-level BCR-ABL persisted. ??He subsequently had difficulty tolerating single agent dasatinib (as a bridge to planned ponatinib-blinatumomab--had fluid retention and pleural effusion). ??He??then completed????4 cycles??of ponatinib-blinatumomab, initiated for treatment of MRD in the setting of poor tolerance of standard therapy.??He is now MRD-negative by flow with BCR-ABL level of 0.002% on marrow by PCR. Patient is now on ponatinib 30mg  monotherapy (since 01/2021).    His respiratory status remains tenuous, currently intubated on FiO2 60%, PEEP 12.  In light of his current infection/hypoxic respiratory failure, would not restart ponatanib until he is more stable. Will need to discuss additional strategies as outpatient to prevent recurrent infections (he has refused IVIG infusions).     Recommendations:   - Appreciate excellent care of MICU and consultation from ICID. Follow-up BAL studies (planned). Recommend CT chest given persistent hypoxia if stable for transport.   - Continue to hold ponatinib for now. Malignant Hematology will evaluate about when to resume. (please ensure patient's family can bring in home supply when ready to resume in the hospital).    This patient has been staffed with Dr. Senaida Ores. These recommendations were discussed with the primary team.     Please contact the malignant hematology fellow at [phone] with any further questions.    Napoleon Form, MD  Hematology-Oncology Fellow    -------------------------------------------------------------    HPI: Adam Keith is a 40 y.o. 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 Malignant hematology was consulted for evaluation of whether to continue TKI for B-ALL.     Seen in ID clinic 12/2 with fever/cough, positive for flu A, CXR concerning for pneumonia, sent home on linezolid. Re-presented to ED on 12/3 with worsening symptoms, including hypotension, hypoxia. WBC 20.6. Admitted to MICU. Overnight had improvement in lactate with IVFs (normalized, initially 4.1 on admission).  Febrile to 38.6 overnight.  Early this morning ~5am, had desaturation to 80% and required 100% FiO2 briefly, was able to be weaned to 75% FiO2 when our team saw him in MICU ~9:30 am.  6AM ABG 7.36/pO2 96/PCO2 35.    He reports significant congestion/cough/SOB but reports he overall feels improved compared to yesterday.  Continues to decline IVIG.    Review of Systems: All positive and pertinent negatives are noted in the HPI; otherwise all other systems are negative    Oncologic History:  Oncology History Overview Note   Referring/Local Oncologist: None    Diagnosis:Ph+ ALL    Genetics:    Karyotype/FISH:Abnormal Karyotype: 46,XY,t(9;22)(q34;q11.2)[1]/45,XY,der(7;9)(q10;q10)t(9;22)(q34;q11.2),der(22)t(9;22)[11]/46,sdl,+der(22)t(9;22)[5]/46,XY[3]     Abnormal FISH: A BCR/ABL1 interphase FISH assay shows an abnormal signal pattern in 97% of the  100 cells scored. 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  The findings support a diagnosis of leukemia and have implications for targeted therapy and for monitoring residual disease.      Molecular Genetics:  BCR-ABL1 p210 transcripts were detected at a level of 46.479 IS% ratio in bone marrow.  BCR-ABL1 p190 transcripts were detected at a level of 4 in 100,000 cells in bone marrow.    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)     01/24/2020 - 04/02/2020 Chemotherapy    IP/OP LEUKEMIA GRAAPH-2005 + RITUXIMAB < 60 YO  rituximab hypercvad (odd and even course)     02/24/2020 Remission    CR with PCR MRD+; marrow showing 70% blasts with <1% blasts, negative flow MRD, BCR-ABL p210 PCR 0.197%; CNS negative for blasts     03/09/2020 Progression    CSF - rare blast ID'ed - IT chemo given    03/13/20 - IT chemo, CSF negative     04/16/2020 Biopsy    BM Bx with 40-50% cellularity, <1% blasts, MRD flow cytometry negative, BCR-ABL p210 transcripts 0.036%     05/28/2020 - 05/28/2020 Chemotherapy    OP AML - CNS THERAPY (INTRATHECAL CYTARABINE, INTRATHECAL METHOTREXATE, OR INTRATHECAL TRIPLE)  Select one of the following: cytarabine IT 100 mg with hydrocortisone 50 mg, cytarabine IT 40 mg with methotrexate 15 mg with hydrocortisone 50 mg, OR methotrexate IT 12 mg with hydrocortisone 50 mg     06/19/2020 -  Chemotherapy    IP/OP LEUKEMIA BLINATUMOMAB 7-DAY INFUSION (MINIMAL RESIDUAL DISEASE; WT >= 22 KG) (HOME INFUSION)      Cycles 1*-4: Blinatumomab 28 mcg/day Days 1-28 of 6-week cycle.  *Given in the inpatient setting on Days 1-3 on Cycle 1 and Days 1-2 on Cycle 2, while other treatment days are given in the outpatient setting.    Cycle 1 MRD Dosing     07/18/2020 Adverse Reaction    Hospitalization: fevers. Pneumonia     07/23/2020 Biopsy    Diagnosis  Bone marrow, right iliac, aspiration and biopsy  -   Normocellular bone marrow (50%) with trilineage hematopoiesis and 1% blasts by manual aspirate differential  -   Flow cytometry MRD analysis reveals no definitive immunophenotypic evidence of residual B lymphoblastic leukemia   - BCR-ABL p210 0.006%         08/10/2020 -  Chemotherapy    Cycle 2 blinatumomab-ponatinib  Ponatinib 30mg     1 IT per cycle     09/21/2020 Adverse Reaction    Covid-19, symptomatic but not requiring hospitalization.     10/06/2020 -  Chemotherapy    Cycle 3 blinatumomab-ponatinib  Ponatinib 30mg        11/01/2020 Adverse Reaction    Hospitalization: MRSA Pneumonia requiring intubation    Blinatumomab stopped.  Ponatinib held until he stabilized.       12/14/2020 -  Chemotherapy    Cycle 4 blinatumomab 28 mcg/day days 1-28, ponatinib 30 mg per day IT triple therapy day 29     ALL (acute lymphoblastic leukemia) (CMS-HCC)   06/11/2020 -  Chemotherapy    IP/OP LEUKEMIA BLINATUMOMAB 7-DAY INFUSION (MINIMAL RESIDUAL DISEASE; WT >= 22  KG) (HOME INFUSION)  Cycles 1*-4: Blinatumomab 28 mcg/day Days 1-28 of 6-week cycle.  *Given in the inpatient setting on Days 1-3 on Cycle 1 and Days 1-2 on Cycle 2, while other treatment days are given in the outpatient setting.     09/21/2020 Initial Diagnosis    ALL (acute lymphoblastic leukemia) (CMS-HCC)         Past Medical History:   Diagnosis Date   ??? 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 BONE MARROW BIOPSY & ASPIRATION  01/21/2020        ??? CHG [place], CHEST,REAL TIME  11/20/2020    Procedure: ULTRASOUND, CHEST, REAL TIME WITH IMAGE DOCUMENTATION;  Surgeon: Jerelyn Charles, MD;  Location: BRON[facility] PROCEDURE LAB [facility]; Service: Pulmonary   ??? IR INSERT PORT AGE GREATER THAN 5 YRS  03/10/2020    IR INSERT PORT AGE GREATER THAN 5 YRS 03/10/2020 [name], MD IMG VIR [facility]   ??? PR BRONCHOSCOPY,DIAGNOSTIC W LAVAGE Bilateral 07/20/2020    Procedure: BRONCHOSCOPY, RIGID OR FLEXIBLE, INCLUDE FLUOROSCOPIC GUIDANCE WHEN PERFORMED; W/BRONCHIAL ALVEOLAR LAVAGE WITH MODERATE SEDATION;  Surgeon: Dellis Filbert, MD;  Location: BRON[facility] PROCEDURE LAB [facility];  Service: Pulmonary       Family History   Problem Relation Age of Onset   ??? Melanoma Neg Hx    ??? Basal cell carcinoma Neg Hx    ??? Squamous cell carcinoma Neg Hx         Social History     Socioeconomic History   ??? Marital status: Married   Tobacco Use   ??? Smoking status: Former     Packs/day: 2.00     Types: Cigarettes     Quit date: 01/20/2020     Years since quitting: 1.1   ??? Smokeless tobacco: Former     Types: Financial planner   ??? Vaping Use: Never used   Other Topics Concern   ??? Do you use sunscreen? No   ??? Tanning bed use? No   ??? Are you easily burned? Yes   ??? Excessive sun exposure? No   ??? Blistering sunburns? Yes     Social Determinants of Health     Financial Resource Strain: Low Risk    ??? Difficulty of Paying Living Expenses: Not very hard   Food Insecurity: No Food Insecurity   ??? Worried About Running Out of Food in the Last Year: Never true   ??? Ran Out of Food in the Last Year: Never true   Transportation Needs: No Transportation Needs   ??? Lack of Transportation (Medical): No   ??? Lack of Transportation (Non-Medical): No       Social History     Social History Narrative   ??? Not on file       Allergies: is allergic to bupropion hcl, cefepime, ceftaroline fosamil, dapsone, onion, vancomycin analogues, bismuth subsalicylate, and furosemide.    Medications:   Meds:  ??? acetaminophen  650 mg Oral Once    And   ??? diphenhydrAMINE  25 mg Oral Once   ??? carboxymethylcellulose sodium  2 drop Both Eyes Q6H SCH   ??? chlorhexidine  15 mL Topical BID   ??? famotidine  20 mg Enteral tube: gastric At bedtime   ??? gabapentin  300 mg Oral TID   ??? heparin (porcine) for subcutaneous use  5,000 Units Subcutaneous Q8H  [facility]   ??? immun glob G(IgG)-pro-IgA 0-50  0.5 g/kg (Ideal) Intravenous Once   ??? flu vacc qs2022-23 6mos up(PF)  0.5 mL Intramuscular During hospitalization   ??? ipratropium-albuteroL  3 mL Nebulization Q6H (RT)   ??? linezolid  600 mg Intravenous Q12H [facility]   ??? LORazepam  4 mg Intravenous Q4H   ??? melatonin  3 mg Enteral tube: gastric  QPM   ??? oseltamivir  30 mg Oral BID   ??? oxyCODONE  10 mg Enteral tube: gastric  Q4H   ??? sulfamethoxazole-trimethoprim  1 tablet Oral 2 times per day on Sun Sat   ??? valACYclovir  500 mg Oral Daily     Continuous Infusions:  ??? HYDROmorphone     ??? sodium chloride       PRN Meds:.acetaminophen, albuterol, Implement **AND** Care order/instruction **AND** Vital signs **AND** sodium chloride **AND** sodium chloride 0.9% **AND** diphenhydrAMINE **AND** famotidine **AND** meperidine **AND** methylPREDNISolone sodium succinate (PF) **AND** EPINEPHrine, HYDROmorphone, LORazepam, midazolam    Objective:   Vitals: Temp:  [37.7 ??C (99.9 ??F)-39.1 ??C (102.4 ??F)] 39.1 ??C (102.4 ??F)  Core Temp:  [39.1 ??C (102.4 ??F)-39.2 ??C (102.6 ??F)] 39.1 ??C (102.4 ??F)  Heart Rate:  [95-122] 119  SpO2 Pulse:  [95-122] 119  Resp:  [25-29] 28  A BP-2: (88-159)/(49-71) 148/63  MAP:  [60 mmHg-94 mmHg] 86 mmHg  FiO2 (%):  [40 %-100 %] 50 %  SpO2:  [93 %-100 %] 93 %    Physical Exam:  BP 125/63  - Pulse 119  - Temp (!) 39.1 ??C (102.4 ??F) (Axillary)  - Resp 28  - Ht 185.4 cm (6' 0.99)  - Wt (!) 110.2 kg (242 lb 15.2 oz)  - SpO2 93%  - BMI 32.06 kg/m??    General appearance - Nontoxic appearing but continued shallow breathing  Mental status - alert, oriented to person, place, and time   Eyes - pupils equal and reactive, extraocular eye movements intact   Nose - normal and patent, no erythema, discharge or polyps   Mouth - mucous membranes moist, pharynx normal without lesions   Neck - supple   Lymphatics - not examined  Pulmonary - On HFNC 75%  Cardiovascular- Sinus tachycardia on monitor  Gastrointestinal - Obese abdomen, distended, nontender.  Neurological - alert, oriented, normal speech, no focal findings or movement disorder noted   Musculoskeletal - no joint tenderness, deformity or swelling   Extremities - peripheral pulses normal, no pedal edema, no clubbing or cyanosis   Skin - normal coloration and turgor, no rashes, no suspicious skin lesions noted       Test Results  Recent Labs     03/07/21  0332 03/08/21  0342 03/08/21  0435 03/09/21  0319 03/09/21  1411   WBC 20.0* 19.2*  --  16.4* 17.1*   NEUTROABS 18.7* 17.5*  --  14.3*  --    HGB 12.0* 11.1* 11.1* 10.5* 10.3*   PLT 183 209  --  225 224       Imaging: Radiology studies were personally reviewed - Large consolidation of RUL, worse compared to 12/3.

## 2021-03-10 NOTE — Unmapped (Signed)
Respiratory Safety Check    Date: 03/10/2021     PPV Device at bedside:   [x]  Yes  []  Replaced  []  N/A    PPV mask at bedside:  [x]  Yes  []  Replaced  []  N/A    Checked humidifier, inspiratory limb, and expiratory limb:   [x]  Yes  []  Replaced  []  N/A    Backup trachs and supplies available at bedside:  []  Yes  []  Replaced  [x]  N/A    Documented trach airway form visible at bedside:   []  Yes  []  Replaced  [x]  N/A    iNO connected with PPV device:  []  Yes  []  Replaced  [x]  N/A

## 2021-03-10 NOTE — Unmapped (Signed)
Patient adversely reacted to IVIG at 1.2 ml/kg/h.  Tachycardic in the 140's, SBP in the 200's.  +Rigors, biting on tube, therefore desats in the 70's on 100%.   PRN benadryl, demerol, ativan, and dilaudid given.  Team notified, acknowledged.  Continuing to monitor for resolution of sxms.

## 2021-03-11 LAB — CBC W/ AUTO DIFF
BASOPHILS ABSOLUTE COUNT: 0.1 10*9/L (ref 0.0–0.1)
BASOPHILS RELATIVE PERCENT: 0.4 %
EOSINOPHILS ABSOLUTE COUNT: 0.1 10*9/L (ref 0.0–0.5)
EOSINOPHILS RELATIVE PERCENT: 0.6 %
HEMATOCRIT: 27.1 % — ABNORMAL LOW (ref 39.0–48.0)
HEMOGLOBIN: 9 g/dL — ABNORMAL LOW (ref 12.9–16.5)
LYMPHOCYTES ABSOLUTE COUNT: 0.6 10*9/L — ABNORMAL LOW (ref 1.1–3.6)
LYMPHOCYTES RELATIVE PERCENT: 4.7 %
MEAN CORPUSCULAR HEMOGLOBIN CONC: 33 g/dL (ref 32.0–36.0)
MEAN CORPUSCULAR HEMOGLOBIN: 28.4 pg (ref 25.9–32.4)
MEAN CORPUSCULAR VOLUME: 86 fL (ref 77.6–95.7)
MEAN PLATELET VOLUME: 8.3 fL (ref 6.8–10.7)
MONOCYTES ABSOLUTE COUNT: 1.1 10*9/L — ABNORMAL HIGH (ref 0.3–0.8)
MONOCYTES RELATIVE PERCENT: 7.8 %
NEUTROPHILS ABSOLUTE COUNT: 11.8 10*9/L — ABNORMAL HIGH (ref 1.8–7.8)
NEUTROPHILS RELATIVE PERCENT: 86.5 %
PLATELET COUNT: 262 10*9/L (ref 150–450)
RED BLOOD CELL COUNT: 3.15 10*12/L — ABNORMAL LOW (ref 4.26–5.60)
RED CELL DISTRIBUTION WIDTH: 17 % — ABNORMAL HIGH (ref 12.2–15.2)
WBC ADJUSTED: 13.6 10*9/L — ABNORMAL HIGH (ref 3.6–11.2)

## 2021-03-11 LAB — BLOOD GAS, ARTERIAL
BASE EXCESS ARTERIAL: -5 — ABNORMAL LOW (ref -2.0–2.0)
BASE EXCESS ARTERIAL: -5.8 — ABNORMAL LOW (ref -2.0–2.0)
BASE EXCESS ARTERIAL: -3.8 — ABNORMAL LOW (ref -2.0–2.0)
BASE EXCESS ARTERIAL: -3.9 — ABNORMAL LOW (ref -2.0–2.0)
FIO2 ARTERIAL: 80
HCO3 ARTERIAL: 20 mmol/L — ABNORMAL LOW (ref 22–27)
HCO3 ARTERIAL: 19 mmol/L — ABNORMAL LOW (ref 22–27)
HCO3 ARTERIAL: 21 mmol/L — ABNORMAL LOW (ref 22–27)
HCO3 ARTERIAL: 21 mmol/L — ABNORMAL LOW (ref 22–27)
O2 SATURATION ARTERIAL: 98.4 % (ref 94.0–100.0)
O2 SATURATION ARTERIAL: 98.2 % (ref 94.0–100.0)
O2 SATURATION ARTERIAL: 98.5 % (ref 94.0–100.0)
O2 SATURATION ARTERIAL: 96.9 % (ref 94.0–100.0)
PCO2 ARTERIAL: 35 mmHg (ref 35.0–45.0)
PCO2 ARTERIAL: 36.3 mmHg (ref 35.0–45.0)
PCO2 ARTERIAL: 38 mmHg (ref 35.0–45.0)
PCO2 ARTERIAL: 35.6 mmHg (ref 35.0–45.0)
PH ARTERIAL: 7.36 (ref 7.35–7.45)
PH ARTERIAL: 7.34 — ABNORMAL LOW (ref 7.35–7.45)
PH ARTERIAL: 7.36 (ref 7.35–7.45)
PH ARTERIAL: 7.38 (ref 7.35–7.45)
PO2 ARTERIAL: 113 mmHg — ABNORMAL HIGH (ref 80.0–110.0)
PO2 ARTERIAL: 109 mmHg (ref 80.0–110.0)
PO2 ARTERIAL: 128 mmHg — ABNORMAL HIGH (ref 80.0–110.0)
PO2 ARTERIAL: 81.3 mmHg (ref 80.0–110.0)

## 2021-03-11 LAB — BASIC METABOLIC PANEL
ANION GAP: 11 mmol/L (ref 5–14)
ANION GAP: 10 mmol/L (ref 5–14)
BLOOD UREA NITROGEN: 52 mg/dL — ABNORMAL HIGH (ref 9–23)
BLOOD UREA NITROGEN: 55 mg/dL — ABNORMAL HIGH (ref 9–23)
BUN / CREAT RATIO: 15
BUN / CREAT RATIO: 15
CALCIUM: 8.4 mg/dL — ABNORMAL LOW (ref 8.7–10.4)
CALCIUM: 8.4 mg/dL — ABNORMAL LOW (ref 8.7–10.4)
CHLORIDE: 105 mmol/L (ref 98–107)
CHLORIDE: 104 mmol/L (ref 98–107)
CO2: 21 mmol/L (ref 20.0–31.0)
CO2: 22 mmol/L (ref 20.0–31.0)
CREATININE: 3.4 mg/dL — ABNORMAL HIGH
CREATININE: 3.72 mg/dL — ABNORMAL HIGH
EGFR CKD-EPI (2021) MALE: 22 mL/min/{1.73_m2} — ABNORMAL LOW (ref >=60)
EGFR CKD-EPI (2021) MALE: 20 mL/min/{1.73_m2} — ABNORMAL LOW (ref >=60)
GLUCOSE RANDOM: 119 mg/dL (ref 70–179)
GLUCOSE RANDOM: 115 mg/dL (ref 70–179)
POTASSIUM: 4.3 mmol/L (ref 3.4–4.8)
POTASSIUM: 4.1 mmol/L (ref 3.4–4.8)
SODIUM: 137 mmol/L (ref 135–145)
SODIUM: 136 mmol/L (ref 135–145)

## 2021-03-11 LAB — IGG: GAMMAGLOBULIN; IGG: 213 mg/dL — ABNORMAL LOW (ref 646–2013)

## 2021-03-11 LAB — HEPATIC FUNCTION PANEL
ALBUMIN: 1.8 g/dL — ABNORMAL LOW (ref 3.4–5.0)
ALKALINE PHOSPHATASE: 123 U/L — ABNORMAL HIGH (ref 46–116)
ALT (SGPT): <7 U/L — ABNORMAL LOW (ref 10–49)
AST (SGOT): 15 U/L (ref <=34)
BILIRUBIN DIRECT: 1 mg/dL — ABNORMAL HIGH (ref 0.00–0.30)
BILIRUBIN TOTAL: 1.2 mg/dL (ref 0.3–1.2)
PROTEIN TOTAL: 5.8 g/dL (ref 5.7–8.2)

## 2021-03-11 LAB — SODIUM, URINE, RANDOM: SODIUM URINE: <10 mmol/L

## 2021-03-11 LAB — PHOSPHORUS: PHOSPHORUS: 4.3 mg/dL (ref 2.4–5.1)

## 2021-03-11 LAB — POTASSIUM, URINE, RANDOM: POTASSIUM URINE: 22.5 mmol/L

## 2021-03-11 LAB — CREATININE, URINE: CREATININE, URINE: 131.2 mg/dL

## 2021-03-11 LAB — UREA NITROGEN, URINE: UREA NITROGEN URINE: 637 mg/dL

## 2021-03-11 LAB — MAGNESIUM
MAGNESIUM: 2.6 mg/dL (ref 1.6–2.6)
MAGNESIUM: 2.5 mg/dL (ref 1.6–2.6)

## 2021-03-11 MED ADMIN — LORazepam (ATIVAN) injection 2 mg: 2 mg | INTRAVENOUS | @ 17:00:00

## 2021-03-11 MED ADMIN — chlorhexidine (PERIDEX) 0.12 % solution 15 mL: 15 mL | TOPICAL | @ 01:00:00

## 2021-03-11 MED ADMIN — senna (SENOKOT) tablet 2 tablet: 2 | ORAL | @ 13:00:00

## 2021-03-11 MED ADMIN — LORazepam (ATIVAN) injection 4 mg: 4 mg | INTRAVENOUS | @ 05:00:00

## 2021-03-11 MED ADMIN — chlorhexidine (PERIDEX) 0.12 % solution 15 mL: 15 mL | TOPICAL | @ 13:00:00

## 2021-03-11 MED ADMIN — LORazepam (ATIVAN) injection 2 mg: 2 mg | INTRAVENOUS | @ 21:00:00

## 2021-03-11 MED ADMIN — ipratropium-albuteroL (DUO-NEB) 0.5-2.5 mg/3 mL nebulizer solution 3 mL: 3 mL | RESPIRATORY_TRACT | @ 08:00:00

## 2021-03-11 MED ADMIN — norepinephrine 8 mg in dextrose 5 % 250 mL (32 mcg/mL) infusion PMB: 0-30 ug/min | INTRAVENOUS | @ 03:00:00

## 2021-03-11 MED ADMIN — carboxymethylcellulose sodium (THERATEARS) 0.25 % ophthalmic solution 2 drop: 2 [drp] | OPHTHALMIC | @ 16:00:00

## 2021-03-11 MED ADMIN — heparin (porcine) 5,000 unit/mL injection 5,000 Units: 5000 [IU] | SUBCUTANEOUS | @ 02:00:00

## 2021-03-11 MED ADMIN — ipratropium-albuteroL (DUO-NEB) 0.5-2.5 mg/3 mL nebulizer solution 3 mL: 3 mL | RESPIRATORY_TRACT | @ 02:00:00

## 2021-03-11 MED ADMIN — linezolid in dextrose 5% (ZYVOX) 600 mg/300 mL IVPB 600 mg: 600 mg | INTRAVENOUS | @ 02:00:00 | Stop: 2021-03-13

## 2021-03-11 MED ADMIN — oxyCODONE (ROXICODONE) immediate release tablet 10 mg: 10 mg | GASTROENTERAL | @ 17:00:00 | Stop: 2021-03-23

## 2021-03-11 MED ADMIN — metoclopramide (REGLAN) injection 10 mg: 10 mg | INTRAVENOUS | @ 02:00:00

## 2021-03-11 MED ADMIN — lactulose oral solution (30 mL cup): 20 g | ORAL | @ 13:00:00

## 2021-03-11 MED ADMIN — famotidine (PEPCID) tablet 20 mg: 20 mg | GASTROENTERAL | @ 02:00:00

## 2021-03-11 MED ADMIN — oxyCODONE (ROXICODONE) immediate release tablet 10 mg: 10 mg | GASTROENTERAL | @ 05:00:00 | Stop: 2021-03-23

## 2021-03-11 MED ADMIN — valACYclovir (VALTREX) tablet 500 mg: 500 mg | ORAL | @ 13:00:00

## 2021-03-11 MED ADMIN — carboxymethylcellulose sodium (THERATEARS) 0.25 % ophthalmic solution 2 drop: 2 [drp] | OPHTHALMIC | @ 05:00:00

## 2021-03-11 MED ADMIN — oxyCODONE (ROXICODONE) immediate release tablet 10 mg: 10 mg | GASTROENTERAL | @ 13:00:00 | Stop: 2021-03-23

## 2021-03-11 MED ADMIN — senna (SENOKOT) tablet 2 tablet: 2 | ORAL | @ 02:00:00

## 2021-03-11 MED ADMIN — oseltamivir (TAMIFLU) capsule 30 mg: 30 mg | ORAL | @ 02:00:00 | Stop: 2021-03-10

## 2021-03-11 MED ADMIN — ipratropium-albuteroL (DUO-NEB) 0.5-2.5 mg/3 mL nebulizer solution 3 mL: 3 mL | RESPIRATORY_TRACT | @ 14:00:00

## 2021-03-11 MED ADMIN — oxyCODONE (ROXICODONE) immediate release tablet 10 mg: 10 mg | GASTROENTERAL | @ 02:00:00 | Stop: 2021-03-23

## 2021-03-11 MED ADMIN — acetaminophen (TYLENOL) tablet 650 mg: 650 mg | ORAL | @ 21:00:00

## 2021-03-11 MED ADMIN — lactulose oral solution (30 mL cup): 20 g | ORAL | @ 17:00:00

## 2021-03-11 MED ADMIN — gabapentin (NEURONTIN) oral solution: 300 mg | GASTROENTERAL | @ 02:00:00

## 2021-03-11 MED ADMIN — melatonin tablet 3 mg: 3 mg | GASTROENTERAL | @ 23:00:00

## 2021-03-11 MED ADMIN — gabapentin (NEURONTIN) oral solution: 300 mg | GASTROENTERAL | @ 21:00:00

## 2021-03-11 MED ADMIN — metoclopramide (REGLAN) injection 10 mg: 10 mg | INTRAVENOUS | @ 19:00:00

## 2021-03-11 MED ADMIN — linezolid in dextrose 5% (ZYVOX) 600 mg/300 mL IVPB 600 mg: 600 mg | INTRAVENOUS | @ 13:00:00 | Stop: 2021-03-13

## 2021-03-11 MED ADMIN — oxyCODONE (ROXICODONE) immediate release tablet 10 mg: 10 mg | GASTROENTERAL | @ 21:00:00 | Stop: 2021-03-23

## 2021-03-11 MED ADMIN — carboxymethylcellulose sodium (THERATEARS) 0.25 % ophthalmic solution 2 drop: 2 [drp] | OPHTHALMIC | @ 23:00:00

## 2021-03-11 MED ADMIN — LORazepam (ATIVAN) injection 4 mg: 4 mg | INTRAVENOUS | @ 13:00:00 | Stop: 2021-03-11

## 2021-03-11 MED ADMIN — heparin (porcine) 5,000 unit/mL injection 5,000 Units: 5000 [IU] | SUBCUTANEOUS | @ 11:00:00

## 2021-03-11 MED ADMIN — LORazepam (ATIVAN) injection 4 mg: 4 mg | INTRAVENOUS | @ 02:00:00

## 2021-03-11 MED ADMIN — carboxymethylcellulose sodium (THERATEARS) 0.25 % ophthalmic solution 2 drop: 2 [drp] | OPHTHALMIC | @ 12:00:00

## 2021-03-11 MED ADMIN — LORazepam (ATIVAN) injection 2 mg: 2 mg | INTRAVENOUS | @ 11:00:00 | Stop: 2021-03-11

## 2021-03-11 MED ADMIN — polyethylene glycol (MIRALAX) packet 17 g: 17 g | ORAL | @ 02:00:00

## 2021-03-11 MED ADMIN — ipratropium-albuteroL (DUO-NEB) 0.5-2.5 mg/3 mL nebulizer solution 3 mL: 3 mL | RESPIRATORY_TRACT | @ 20:00:00

## 2021-03-11 MED ADMIN — lactated ringers bolus 1,000 mL: 1000 mL | INTRAVENOUS | @ 16:00:00 | Stop: 2021-03-11

## 2021-03-11 MED ADMIN — norepinephrine 8 mg in dextrose 5 % 250 mL (32 mcg/mL) infusion PMB: 0-30 ug/min | INTRAVENOUS | @ 12:00:00

## 2021-03-11 MED ADMIN — acetaminophen (TYLENOL) tablet 650 mg: 650 mg | ORAL | @ 09:00:00

## 2021-03-11 MED ADMIN — heparin (porcine) 5,000 unit/mL injection 5,000 Units: 5000 [IU] | SUBCUTANEOUS | @ 19:00:00

## 2021-03-11 MED ADMIN — gabapentin (NEURONTIN) oral solution: 300 mg | GASTROENTERAL | @ 11:00:00

## 2021-03-11 MED ADMIN — HYDROmorphone (PF) (DILAUDID) injection 2 mg: 2 mg | INTRAVENOUS | @ 11:00:00 | Stop: 2021-03-21

## 2021-03-11 MED ADMIN — LORazepam (ATIVAN) injection 4 mg: 4 mg | INTRAVENOUS | @ 09:00:00 | Stop: 2021-03-11

## 2021-03-11 MED ADMIN — HYDROmorphone 1 mg/mL in 0.9% sodium chloride: 0-4 mg/h | INTRAVENOUS | @ 03:00:00 | Stop: 2021-03-21

## 2021-03-11 MED ADMIN — lactulose oral solution (30 mL cup): 20 g | ORAL | @ 23:00:00

## 2021-03-11 MED ADMIN — metoclopramide (REGLAN) injection 10 mg: 10 mg | INTRAVENOUS | @ 11:00:00

## 2021-03-11 MED ADMIN — oxyCODONE (ROXICODONE) immediate release tablet 10 mg: 10 mg | GASTROENTERAL | @ 09:00:00 | Stop: 2021-03-23

## 2021-03-11 NOTE — Unmapped (Signed)
Patient remained free from falls/injury. Monitoring VS, intermittently febrile. Oxygen requirements are at 60%.  Remains sedated on dilaudid and off of levo.  Adverse reaction to IVIG, so partial dose received.  Tolerating trickle feeds- still no BM.  Bed low and locked. No complaints expressed at this time, will continue to monitor.   Q2 hour turns/skin care completed and documented.         Problem: Adult Inpatient Plan of Care  Goal: Plan of Care Review  Outcome: Ongoing - Unchanged  Goal: Patient-Specific Goal (Individualized)  Outcome: Ongoing - Unchanged  Goal: Absence of Hospital-Acquired Illness or Injury  Outcome: Ongoing - Unchanged  Intervention: Identify and Manage Fall Risk  Recent Flowsheet Documentation  Taken 03/10/2021 0800 by Dalbert Garnet, RN  Safety Interventions:   aspiration precautions   bed alarm   commode/urinal/bedpan at bedside   enteral feeding safety   environmental modification   fall reduction program maintained   infection management   isolation precautions   lighting adjusted for tasks/safety   room near unit station   toileting scheduled  Intervention: Prevent Skin Injury  Recent Flowsheet Documentation  Taken 03/10/2021 0800 by Dalbert Garnet, RN  Skin Protection:   adhesive use limited   cleansing with dimethicone incontinence wipes   incontinence pads utilized   mittens applied to hands   protective footwear used   pulse oximeter probe site changed   silicone foam dressing in place   skin-to-device areas padded   skin-to-skin areas padded   tubing/devices free from skin contact  Intervention: Prevent and Manage VTE (Venous Thromboembolism) Risk  Recent Flowsheet Documentation  Taken 03/10/2021 0800 by Dalbert Garnet, RN  Activity Management: bedrest  Range of Motion: Bilateral Upper and Lower Extremities  Intervention: Prevent Infection  Recent Flowsheet Documentation  Taken 03/10/2021 0800 by Dalbert Garnet, RN  Infection Prevention:   cohorting utilized   environmental surveillance performed   equipment surfaces disinfected   hand hygiene promoted   personal protective equipment utilized   rest/sleep promoted   single patient room provided   visitors restricted/screened  Goal: Optimal Comfort and Wellbeing  Outcome: Ongoing - Unchanged  Goal: Readiness for Transition of Care  Outcome: Ongoing - Unchanged  Goal: Rounds/Family Conference  Outcome: Ongoing - Unchanged     Problem: Infection  Goal: Absence of Infection Signs and Symptoms  Outcome: Ongoing - Unchanged  Intervention: Prevent or Manage Infection  Recent Flowsheet Documentation  Taken 03/10/2021 0800 by Dalbert Garnet, RN  Infection Management:   aseptic technique maintained   cultures obtained and sent to lab  Isolation Precautions:   droplet precautions maintained   contact precautions maintained     Problem: Skin Injury Risk Increased  Goal: Skin Health and Integrity  Outcome: Ongoing - Unchanged  Intervention: Optimize Skin Protection  Recent Flowsheet Documentation  Taken 03/10/2021 1800 by Dalbert Garnet, RN  Pressure Reduction Techniques:   frequent weight shift encouraged   heels elevated off bed   pressure points protected   weight shift assistance provided  Taken 03/10/2021 1600 by Dalbert Garnet, RN  Pressure Reduction Techniques:   frequent weight shift encouraged   heels elevated off bed   pressure points protected   weight shift assistance provided  Head of Bed (HOB) Positioning: HOB at 30 degrees  Taken 03/10/2021 1400 by Dalbert Garnet, RN  Pressure Reduction Techniques:   frequent weight shift encouraged   heels elevated off bed   pressure points protected  weight shift assistance provided  Taken 03/10/2021 1200 by Dalbert Garnet, RN  Pressure Reduction Techniques:   frequent weight shift encouraged   heels elevated off bed   pressure points protected   weight shift assistance provided  Taken 03/10/2021 1000 by Dalbert Garnet, RN  Pressure Reduction Techniques:   frequent weight shift encouraged   heels elevated off bed   pressure points protected   weight shift assistance provided  Taken 03/10/2021 0800 by Dalbert Garnet, RN  Pressure Reduction Techniques:   frequent weight shift encouraged   heels elevated off bed   pressure points protected   weight shift assistance provided  Head of Bed (HOB) Positioning: HOB at 30-45 degrees  Pressure Reduction Devices:   heel offloading device utilized   positioning supports utilized   specialty bed utilized  Skin Protection:   adhesive use limited   cleansing with dimethicone incontinence wipes   incontinence pads utilized   mittens applied to hands   protective footwear used   pulse oximeter probe site changed   silicone foam dressing in place   skin-to-device areas padded   skin-to-skin areas padded   tubing/devices free from skin contact     Problem: Fluid Imbalance (Pneumonia)  Goal: Fluid Balance  Outcome: Ongoing - Unchanged  Intervention: Monitor and Manage Fluid Balance  Recent Flowsheet Documentation  Taken 03/10/2021 0800 by Dalbert Garnet, RN  Fluid/Electrolyte Management: electrolyte supplement initiated     Problem: Infection (Pneumonia)  Goal: Resolution of Infection Signs and Symptoms  Outcome: Ongoing - Unchanged  Intervention: Prevent Infection Progression  Recent Flowsheet Documentation  Taken 03/10/2021 0800 by Dalbert Garnet, RN  Infection Management:   aseptic technique maintained   cultures obtained and sent to lab  Isolation Precautions:   droplet precautions maintained   contact precautions maintained     Problem: Respiratory Compromise (Pneumonia)  Goal: Effective Oxygenation and Ventilation  Outcome: Ongoing - Unchanged  Intervention: Optimize Oxygenation and Ventilation  Recent Flowsheet Documentation  Taken 03/10/2021 1600 by Dalbert Garnet, RN  Head of Bed [facility]) Positioning: HOB at 30 degrees  Taken 03/10/2021 0800 by Dalbert Garnet, RN  Head of Bed [facility]) Positioning: HOB at 30-45 degrees     Problem: Pain Chronic (Persistent) (Comorbidity Management)  Goal: Acceptable Pain Control and Functional Ability  Outcome: Ongoing - Unchanged     Problem: Self-Care Deficit  Goal: Improved Ability to Complete Activities of Daily Living  Outcome: Ongoing - Unchanged     Problem: Non-Violent Restraints  Goal: Patient will remain free of restraint events  Outcome: Ongoing - Unchanged  Goal: Patient will remain free of physical injury  Outcome: Ongoing - Unchanged     Problem: Communication Impairment (Mechanical Ventilation, Invasive)  Goal: Effective Communication  Outcome: Ongoing - Unchanged     Problem: Device-Related Complication Risk (Mechanical Ventilation, Invasive)  Goal: Optimal Device Function  Outcome: Ongoing - Unchanged  Intervention: Optimize Device Care and Function  Recent Flowsheet Documentation  Taken 03/10/2021 0800 by Dalbert Garnet, RN  Aspiration Precautions:   NPO pending swallow screening/evaluation   oral hygiene care promoted   respiratory status monitored   tube feeding placement verified   upright posture maintained     Problem: Inability to Wean (Mechanical Ventilation, Invasive)  Goal: Mechanical Ventilation Liberation  Outcome: Ongoing - Unchanged     Problem: Nutrition Impairment (Mechanical Ventilation, Invasive)  Goal: Optimal Nutrition Delivery  Outcome: Ongoing - Unchanged     Problem: Skin and  Tissue Injury (Mechanical Ventilation, Invasive)  Goal: Absence of Device-Related Skin and Tissue Injury  Outcome: Ongoing - Unchanged  Intervention: Maintain Skin and Tissue Health  Recent Flowsheet Documentation  Taken 03/10/2021 0800 by Dalbert Garnet, RN  Device Skin Pressure Protection:   absorbent pad utilized/changed   adhesive use limited   mittens applied to hands   positioning supports utilized   pressure points protected   skin-to-device areas padded   skin-to-skin areas padded     Problem: Ventilator-Induced Lung Injury (Mechanical Ventilation, Invasive)  Goal: Absence of Ventilator-Induced Lung Injury  Outcome: Ongoing - Unchanged  Intervention: Prevent Ventilator-Associated Pneumonia  Recent Flowsheet Documentation  Taken 03/10/2021 1800 by Dalbert Garnet, RN  Oral Care:   lip/mouth moisturizer applied   mouth swabbed   oral rinse provided   suction provided   tongue brushed  Taken 03/10/2021 1600 by Dalbert Garnet, RN  Head of Bed [facility]) Positioning: HOB at 30 degrees  Oral Care:   lip/mouth moisturizer applied   mouth swabbed   oral rinse provided   suction provided   tongue brushed  Suction Type: Subglottic  Taken 03/10/2021 1200 by Dalbert Garnet, RN  Oral Care:   lip/mouth moisturizer applied   mouth swabbed   oral rinse provided   suction provided   tongue brushed  Suction Type: Subglottic  Taken 03/10/2021 0800 by Dalbert Garnet, RN  Head of Bed [facility] Positioning: HOB at 30-45 degrees  Oral Care:   lip/mouth moisturizer applied   mouth swabbed   oral rinse provided   suction provided   teeth brushed   tongue brushed  Suction Type: Subglottic     Problem: Impaired Wound Healing  Goal: Optimal Wound Healing  Outcome: Ongoing - Unchanged  Intervention: Promote Wound Healing  Recent Flowsheet Documentation  Taken 03/10/2021 0800 by Dalbert Garnet, RN  Activity Management: bedrest

## 2021-03-11 NOTE — Unmapped (Signed)
Respiratory Safety Check    Date: 03/11/2021     PPV Device at bedside:   [x]  Yes  []  Replaced  []  N/A    PPV mask at bedside:  [x]  Yes  []  Replaced  []  N/A    Checked humidifier, inspiratory limb, and expiratory limb:   [x]  Yes  []  Replaced  []  N/A    Backup trachs and supplies available at bedside:  []  Yes  []  Replaced  [x]  N/A    Documented trach airway form visible at bedside:   []  Yes  []  Replaced  [x]  N/A    iNO connected with PPV device:  []  Yes  []  Replaced  [x]  N/A

## 2021-03-11 NOTE — Unmapped (Signed)
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 *        Cross cover summary     MAP dropped to ~60 with some decreased urine output.  Started on norepi 4 for MAP goal 65.  2000 ABG stable.  FiO2 weaned down to 50%.      Juanda Chance, MD

## 2021-03-11 NOTE — Unmapped (Signed)
Respiratory Safety Check    Date: 03/10/2021     PPV Device at bedside:   [x]  Yes  []  Replaced  []  N/A    PPV mask at bedside:  [x]  Yes  []  Replaced  []  N/A    Checked humidifier, inspiratory limb, and expiratory limb:   [x]  Yes  []  Replaced  []  N/A    Backup trachs and supplies available at bedside:  []  Yes  []  Replaced  [x]  N/A    Documented trach airway form visible at bedside:   []  Yes  []  Replaced  [x]  N/A    iNO connected with PPV device:  []  Yes  []  Replaced  [x]  N/A

## 2021-03-12 LAB — CBC W/ AUTO DIFF
BASOPHILS ABSOLUTE COUNT: 0.1 10*9/L (ref 0.0–0.1)
BASOPHILS RELATIVE PERCENT: 0.5 %
EOSINOPHILS ABSOLUTE COUNT: 0.1 10*9/L (ref 0.0–0.5)
EOSINOPHILS RELATIVE PERCENT: 0.8 %
HEMATOCRIT: 26.9 % — ABNORMAL LOW (ref 39.0–48.0)
HEMOGLOBIN: 8.9 g/dL — ABNORMAL LOW (ref 12.9–16.5)
LYMPHOCYTES ABSOLUTE COUNT: 1.1 10*9/L (ref 1.1–3.6)
LYMPHOCYTES RELATIVE PERCENT: 6.6 %
MEAN CORPUSCULAR HEMOGLOBIN CONC: 33 g/dL (ref 32.0–36.0)
MEAN CORPUSCULAR HEMOGLOBIN: 28.6 pg (ref 25.9–32.4)
MEAN CORPUSCULAR VOLUME: 86.6 fL (ref 77.6–95.7)
MEAN PLATELET VOLUME: 7.9 fL (ref 6.8–10.7)
MONOCYTES ABSOLUTE COUNT: 1.1 10*9/L — ABNORMAL HIGH (ref 0.3–0.8)
MONOCYTES RELATIVE PERCENT: 6.8 %
NEUTROPHILS ABSOLUTE COUNT: 14.1 10*9/L — ABNORMAL HIGH (ref 1.8–7.8)
NEUTROPHILS RELATIVE PERCENT: 85.3 %
PLATELET COUNT: 268 10*9/L (ref 150–450)
RED BLOOD CELL COUNT: 3.11 10*12/L — ABNORMAL LOW (ref 4.26–5.60)
RED CELL DISTRIBUTION WIDTH: 17.6 % — ABNORMAL HIGH (ref 12.2–15.2)
WBC ADJUSTED: 16.5 10*9/L — ABNORMAL HIGH (ref 3.6–11.2)

## 2021-03-12 LAB — BLOOD GAS CRITICAL CARE PANEL, ARTERIAL
BASE EXCESS ARTERIAL: -4.7 — ABNORMAL LOW (ref -2.0–2.0)
CALCIUM IONIZED ARTERIAL (MG/DL): 4.54 mg/dL (ref 4.40–5.40)
FIO2 ARTERIAL: 50
GLUCOSE WHOLE BLOOD: 107 mg/dL (ref 70–179)
HCO3 ARTERIAL: 20 mmol/L — ABNORMAL LOW (ref 22–27)
HEMOGLOBIN BLOOD GAS: 8.9 g/dL — ABNORMAL LOW
LACTATE BLOOD ARTERIAL: 0.7 mmol/L (ref <1.3)
O2 SATURATION ARTERIAL: 95.5 % (ref 94.0–100.0)
PCO2 ARTERIAL: 33.6 mmHg — ABNORMAL LOW (ref 35.0–45.0)
PH ARTERIAL: 7.38 (ref 7.35–7.45)
PO2 ARTERIAL: 77.7 mmHg — ABNORMAL LOW (ref 80.0–110.0)
POTASSIUM WHOLE BLOOD: 3.8 mmol/L (ref 3.4–4.6)
SODIUM WHOLE BLOOD: 140 mmol/L (ref 135–145)

## 2021-03-12 LAB — BASIC METABOLIC PANEL
ANION GAP: 13 mmol/L (ref 5–14)
ANION GAP: 11 mmol/L (ref 5–14)
BLOOD UREA NITROGEN: 65 mg/dL — ABNORMAL HIGH (ref 9–23)
BLOOD UREA NITROGEN: 64 mg/dL — ABNORMAL HIGH (ref 9–23)
BUN / CREAT RATIO: 17
BUN / CREAT RATIO: 18
CALCIUM: 8 mg/dL — ABNORMAL LOW (ref 8.7–10.4)
CALCIUM: 8.3 mg/dL — ABNORMAL LOW (ref 8.7–10.4)
CHLORIDE: 107 mmol/L (ref 98–107)
CHLORIDE: 107 mmol/L (ref 98–107)
CO2: 20 mmol/L (ref 20.0–31.0)
CO2: 21 mmol/L (ref 20.0–31.0)
CREATININE: 3.85 mg/dL — ABNORMAL HIGH
CREATININE: 3.46 mg/dL — ABNORMAL HIGH
EGFR CKD-EPI (2021) MALE: 19 mL/min/{1.73_m2} — ABNORMAL LOW (ref >=60)
EGFR CKD-EPI (2021) MALE: 22 mL/min/{1.73_m2} — ABNORMAL LOW (ref >=60)
GLUCOSE RANDOM: 112 mg/dL (ref 70–179)
GLUCOSE RANDOM: 110 mg/dL (ref 70–179)
POTASSIUM: 4.5 mmol/L (ref 3.4–4.8)
POTASSIUM: 3.9 mmol/L (ref 3.4–4.8)
SODIUM: 140 mmol/L (ref 135–145)
SODIUM: 139 mmol/L (ref 135–145)

## 2021-03-12 LAB — BLOOD GAS, ARTERIAL
BASE EXCESS ARTERIAL: -2.9 — ABNORMAL LOW (ref -2.0–2.0)
BASE EXCESS ARTERIAL: -6.3 — ABNORMAL LOW (ref -2.0–2.0)
BASE EXCESS ARTERIAL: -2.3 — ABNORMAL LOW (ref -2.0–2.0)
BASE EXCESS ARTERIAL: -3.1 — ABNORMAL LOW (ref -2.0–2.0)
HCO3 ARTERIAL: 21 mmol/L — ABNORMAL LOW (ref 22–27)
HCO3 ARTERIAL: 18 mmol/L — ABNORMAL LOW (ref 22–27)
HCO3 ARTERIAL: 22 mmol/L (ref 22–27)
HCO3 ARTERIAL: 21 mmol/L — ABNORMAL LOW (ref 22–27)
O2 SATURATION ARTERIAL: 94.7 % (ref 94.0–100.0)
O2 SATURATION ARTERIAL: 95.5 % (ref 94.0–100.0)
O2 SATURATION ARTERIAL: 90.9 % — ABNORMAL LOW (ref 94.0–100.0)
O2 SATURATION ARTERIAL: 96.3 % (ref 94.0–100.0)
PCO2 ARTERIAL: 35.5 mmHg (ref 35.0–45.0)
PCO2 ARTERIAL: 32.7 mmHg — ABNORMAL LOW (ref 35.0–45.0)
PCO2 ARTERIAL: 36.3 mmHg (ref 35.0–45.0)
PCO2 ARTERIAL: 33.5 mmHg — ABNORMAL LOW (ref 35.0–45.0)
PH ARTERIAL: 7.39 (ref 7.35–7.45)
PH ARTERIAL: 7.36 (ref 7.35–7.45)
PH ARTERIAL: 7.4 (ref 7.35–7.45)
PH ARTERIAL: 7.41 (ref 7.35–7.45)
PO2 ARTERIAL: 73.3 mmHg — ABNORMAL LOW (ref 80.0–110.0)
PO2 ARTERIAL: 84.1 mmHg (ref 80.0–110.0)
PO2 ARTERIAL: 63.4 mmHg — ABNORMAL LOW (ref 80.0–110.0)
PO2 ARTERIAL: 81.6 mmHg (ref 80.0–110.0)

## 2021-03-12 LAB — MAGNESIUM
MAGNESIUM: 2.5 mg/dL (ref 1.6–2.6)
MAGNESIUM: 2.2 mg/dL (ref 1.6–2.6)

## 2021-03-12 LAB — HEPATIC FUNCTION PANEL
ALBUMIN: 1.4 g/dL — ABNORMAL LOW (ref 3.4–5.0)
ALKALINE PHOSPHATASE: 211 U/L — ABNORMAL HIGH (ref 46–116)
ALT (SGPT): 21 U/L (ref 10–49)
AST (SGOT): 71 U/L — ABNORMAL HIGH (ref <=34)
BILIRUBIN DIRECT: 1.7 mg/dL — ABNORMAL HIGH (ref 0.00–0.30)
BILIRUBIN TOTAL: 1.9 mg/dL — ABNORMAL HIGH (ref 0.3–1.2)
PROTEIN TOTAL: 4.9 g/dL — ABNORMAL LOW (ref 5.7–8.2)

## 2021-03-12 LAB — PHOSPHORUS: PHOSPHORUS: 4.7 mg/dL (ref 2.4–5.1)

## 2021-03-12 MED ADMIN — gabapentin (NEURONTIN) oral solution: 100 mg | GASTROENTERAL | @ 19:00:00

## 2021-03-12 MED ADMIN — oxyCODONE (ROXICODONE) immediate release tablet 10 mg: 10 mg | GASTROENTERAL | @ 01:00:00 | Stop: 2021-03-23

## 2021-03-12 MED ADMIN — senna (SENOKOT) tablet 2 tablet: 2 | ORAL | @ 13:00:00

## 2021-03-12 MED ADMIN — linezolid in dextrose 5% (ZYVOX) 600 mg/300 mL IVPB 600 mg: 600 mg | INTRAVENOUS | @ 01:00:00 | Stop: 2021-03-13

## 2021-03-12 MED ADMIN — acetaminophen (TYLENOL) tablet 650 mg: 650 mg | ORAL | @ 17:00:00

## 2021-03-12 MED ADMIN — oxyCODONE (ROXICODONE) immediate release tablet 10 mg: 10 mg | GASTROENTERAL | @ 22:00:00 | Stop: 2021-03-19

## 2021-03-12 MED ADMIN — LORazepam (ATIVAN) injection 2 mg: 2 mg | INTRAVENOUS | @ 21:00:00

## 2021-03-12 MED ADMIN — chlorhexidine (PERIDEX) 0.12 % solution 15 mL: 15 mL | TOPICAL | @ 01:00:00

## 2021-03-12 MED ADMIN — immun glob G(IgG)-pro-IgA 0-50 (PRIVIGEN) 10 % intravenous solution 40 g: .5 g/kg | INTRAVENOUS | @ 20:00:00 | Stop: 2021-03-12

## 2021-03-12 MED ADMIN — oxyCODONE (ROXICODONE) immediate release tablet 10 mg: 10 mg | GASTROENTERAL | @ 13:00:00 | Stop: 2021-03-12

## 2021-03-12 MED ADMIN — valACYclovir (VALTREX) tablet 500 mg: 500 mg | ORAL | @ 13:00:00

## 2021-03-12 MED ADMIN — LORazepam (ATIVAN) injection 2 mg: 2 mg | INTRAVENOUS | @ 09:00:00

## 2021-03-12 MED ADMIN — carboxymethylcellulose sodium (THERATEARS) 0.25 % ophthalmic solution 2 drop: 2 [drp] | OPHTHALMIC | @ 05:00:00

## 2021-03-12 MED ADMIN — lactulose oral solution (30 mL cup): 20 g | ORAL | @ 10:00:00

## 2021-03-12 MED ADMIN — LORazepam (ATIVAN) injection 2 mg: 2 mg | INTRAVENOUS | @ 01:00:00

## 2021-03-12 MED ADMIN — acetaminophen (TYLENOL) tablet 650 mg: 650 mg | ORAL | @ 09:00:00

## 2021-03-12 MED ADMIN — chlorhexidine (PERIDEX) 0.12 % solution 15 mL: 15 mL | TOPICAL | @ 13:00:00

## 2021-03-12 MED ADMIN — ipratropium-albuteroL (DUO-NEB) 0.5-2.5 mg/3 mL nebulizer solution 3 mL: 3 mL | RESPIRATORY_TRACT | @ 21:00:00

## 2021-03-12 MED ADMIN — ipratropium-albuteroL (DUO-NEB) 0.5-2.5 mg/3 mL nebulizer solution 3 mL: 3 mL | RESPIRATORY_TRACT | @ 09:00:00

## 2021-03-12 MED ADMIN — oxyCODONE (ROXICODONE) immediate release tablet 10 mg: 10 mg | GASTROENTERAL | @ 17:00:00 | Stop: 2021-03-19

## 2021-03-12 MED ADMIN — norepinephrine 8 mg in dextrose 5 % 250 mL (32 mcg/mL) infusion PMB: 0-30 ug/min | INTRAVENOUS | @ 05:00:00

## 2021-03-12 MED ADMIN — heparin (porcine) 5,000 unit/mL injection 5,000 Units: 5000 [IU] | SUBCUTANEOUS | @ 04:00:00

## 2021-03-12 MED ADMIN — metoclopramide (REGLAN) injection 10 mg: 10 mg | INTRAVENOUS | @ 04:00:00

## 2021-03-12 MED ADMIN — heparin (porcine) 5,000 unit/mL injection 5,000 Units: 5000 [IU] | SUBCUTANEOUS | @ 19:00:00

## 2021-03-12 MED ADMIN — lactated ringers bolus 1,000 mL: 1000 mL | INTRAVENOUS | @ 13:00:00 | Stop: 2021-03-12

## 2021-03-12 MED ADMIN — senna (SENOKOT) tablet 2 tablet: 2 | ORAL | @ 01:00:00

## 2021-03-12 MED ADMIN — oxyCODONE (ROXICODONE) immediate release tablet 10 mg: 10 mg | GASTROENTERAL | @ 05:00:00 | Stop: 2021-03-23

## 2021-03-12 MED ADMIN — ipratropium-albuteroL (DUO-NEB) 0.5-2.5 mg/3 mL nebulizer solution 3 mL: 3 mL | RESPIRATORY_TRACT | @ 02:00:00

## 2021-03-12 MED ADMIN — metoclopramide (REGLAN) injection 10 mg: 10 mg | INTRAVENOUS | @ 10:00:00

## 2021-03-12 MED ADMIN — lactulose oral solution (30 mL cup): 20 g | ORAL | @ 17:00:00

## 2021-03-12 MED ADMIN — diphenhydrAMINE (BENADRYL) capsule/tablet 25 mg: 25 mg | ORAL | @ 19:00:00

## 2021-03-12 MED ADMIN — LORazepam (ATIVAN) injection 2 mg: 2 mg | INTRAVENOUS | @ 17:00:00

## 2021-03-12 MED ADMIN — oxyCODONE (ROXICODONE) immediate release tablet 10 mg: 10 mg | GASTROENTERAL | @ 09:00:00 | Stop: 2021-03-12

## 2021-03-12 MED ADMIN — ipratropium-albuteroL (DUO-NEB) 0.5-2.5 mg/3 mL nebulizer solution 3 mL: 3 mL | RESPIRATORY_TRACT | @ 14:00:00

## 2021-03-12 MED ADMIN — metoclopramide (REGLAN) injection 10 mg: 10 mg | INTRAVENOUS | @ 19:00:00

## 2021-03-12 MED ADMIN — famotidine (PEPCID) tablet 20 mg: 20 mg | GASTROENTERAL | @ 01:00:00

## 2021-03-12 MED ADMIN — melatonin tablet 3 mg: 3 mg | GASTROENTERAL | @ 22:00:00

## 2021-03-12 MED ADMIN — linezolid in dextrose 5% (ZYVOX) 600 mg/300 mL IVPB 600 mg: 600 mg | INTRAVENOUS | @ 13:00:00 | Stop: 2021-03-19

## 2021-03-12 MED ADMIN — HYDROmorphone (PF) (DILAUDID) injection 2 mg: 2 mg | INTRAVENOUS | @ 22:00:00 | Stop: 2021-03-21

## 2021-03-12 MED ADMIN — gabapentin (NEURONTIN) oral solution: 300 mg | GASTROENTERAL | @ 04:00:00

## 2021-03-12 MED ADMIN — acetaminophen (TYLENOL) tablet 650 mg: 650 mg | ORAL | @ 02:00:00

## 2021-03-12 MED ADMIN — carboxymethylcellulose sodium (THERATEARS) 0.25 % ophthalmic solution 2 drop: 2 [drp] | OPHTHALMIC | @ 17:00:00

## 2021-03-12 MED ADMIN — heparin (porcine) 5,000 unit/mL injection 5,000 Units: 5000 [IU] | SUBCUTANEOUS | @ 10:00:00

## 2021-03-12 MED ADMIN — carboxymethylcellulose sodium (THERATEARS) 0.25 % ophthalmic solution 2 drop: 2 [drp] | OPHTHALMIC | @ 22:00:00

## 2021-03-12 MED ADMIN — carboxymethylcellulose sodium (THERATEARS) 0.25 % ophthalmic solution 2 drop: 2 [drp] | OPHTHALMIC | @ 10:00:00

## 2021-03-12 MED ADMIN — gabapentin (NEURONTIN) oral solution: 300 mg | GASTROENTERAL | @ 10:00:00 | Stop: 2021-03-12

## 2021-03-12 MED ADMIN — lactulose oral solution (30 mL cup): 20 g | ORAL | @ 05:00:00

## 2021-03-12 MED ADMIN — LORazepam (ATIVAN) injection 2 mg: 2 mg | INTRAVENOUS | @ 05:00:00

## 2021-03-12 MED ADMIN — bisacodyL (DULCOLAX) suppository 10 mg: 10 mg | RECTAL | @ 22:00:00

## 2021-03-12 MED ADMIN — lactulose oral solution (30 mL cup): 20 g | ORAL | @ 22:00:00

## 2021-03-12 MED ADMIN — acetaminophen (TYLENOL) tablet 650 mg: 650 mg | ORAL | @ 22:00:00

## 2021-03-12 MED ADMIN — LORazepam (ATIVAN) injection 2 mg: 2 mg | INTRAVENOUS | @ 13:00:00

## 2021-03-12 NOTE — Unmapped (Signed)
Immunocompromised Host ID Attending Addendum  I saw and evaluated the patient. I discussed and agree with the findings and the plan of care as documented in the fellow???s note.The patient is at risk of decompensation from infection due to underlying immunocompromise and/or mucosal barrier defects and/or presence of medical devices.   I personally reviewed updated microbiological culture and susceptibility data.  I personally reviewed updated relevant radiological studies.    Adam Keith  Immunocompromised Host Infectious Diseases  Pager [phone]     IMMUNOCOMPROMISED HOST INFECTIOUS DISEASE PROGRESS NOTE      Adam Keith is being seen in consultation at the request of [name], MD for evaluation of pneumonia.    Assessment/Recommendations:    Adam 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 Patient received linezolid, aztreonam and oseltamavir ( completed course 12/7)  since admission . Given the cultures did not grow gram negative pathogen aztreonam is stopped on 12/6 and he continues on linezolid. We are concerned that his persistent fevers may be secondary to a MRSA pulmonary abscess/ empyema, and recommend when possible for CT chest to evaluate this further.     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 krusei fungemia  - Prior chemotherapy: [id number] induction with dasatinib (vincristine, dexamethasone and dasatinib); C1D1 01/24/2020; difficulty tolerating single-agent dasatinib  - Current chemotherapy: blinatumomab (anti-CD19/CD3) + ponatinib (TKI) s/p C4 on 12/14/20; 01/2021 now on ponatinib monotherapy maintenance  - 01/11/21: Normocellular bone marrow (30% overall) with trilineage hematopoiesis and less than 1% blasts by manual aspirate differential; Flow cytometry MRD analysis reveals no definitive immunophenotypic evidence of residual B lymphoblastic leukemia; BCR-ABL p210 transcripts were detected at a level of 0.002 IS % ratio in bone marrow.    # Hypogammaglobulimia  - 11/01/20 IgG 266 declined IVIG  - 03/06/21 IgG 148 - IVIG infusion stopped as patient developed rigors, tachycardia and HTN during infusion despite pre-medication     # AKI  Cr up to 3 from b/l ~1.6-1.8, improved with fluids.     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  - 12/6 tracheal aspirate: MRSA  Tx : Linezolid /Oseltamivir---> Linezolid/aztreonam/flagyl/azithromycin/oseltamivir 12/3 --> linezolid/aztreonam/osteltamivir 12/3 -->linezolid [name] 12/5--->linezolid 12/6    Prior infections:  #MRSA RLL PNA c/b bacteremia 11/01/2020, probable right empyema 8/12/202; presumed relapsed RML MRSA pneumonia 11/18/2020  - 8/19 Failed thoracocentesis by IP  - 9/26 CT chest parenchyma without e/o infection, persistent but decreased loculated effusions on right  -  s/p MRSA antibiotics until 01/04/2021  #Persistent right chest pain 12/29/20; Low grade fever, weakness 01/28/21  - 02/02/2021 - Dr. 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  #Possible COVID-18 associated fungal infection 07/17/20 s/p 07/23/20 isavuconazole until 12/03/20     Antimicrobial Intolerance/allergy  Cefepime - DRESS  Ceftaroline - DRESS  Dapsone - possible agranulocytosis, per chart anaphylaxis  Vancomycin - probable ototoxicity (in combination with furosemide)  Isavuconazole - elevated LFTs in the setting of TKI     RECOMMENDATIONS    Diagnostic  Follow-up BCx 12/3 - NGTD  Recommend CT chest/Bedside ultrasound to evaluate for pleural effusions/empyema when patient is stable (no obvious effusions on XR, but difficult to assess)     Monitoring for antimicrobial toxicities  Monitor CBC with diff and CMP at least twice weekly    Treatment  # Influenza with superimposed MRSA bacterial pneumonia 03/05/21  CONTINUE linezolid 600 mg bid for treatment of MRSA pneumonia - anticipate 10-14 day course or longer if there is evidence of empyema on CT     Prophylaxis  Cont valacyclovir, bactrim  Recommend COVID BV after discharge          The [facility] ID service will continue to follow.  Please page the ID Transplant/Liquid Oncology Fellow consult at [phone] with questions.  Patient was discussed with Dr Jimmye Norman.    Kandice Moos, MD  Infectious Diseases Fellow  [facility]    Interval HPI:      Source of information includes:  Electronic Medical Records.  History obtained from: EMR Interval events since last encounter: remains intubated, FiO2 70%,  Intermittently febrile, tmax 38.6. Pressures otherwise stable.   ROS: intubated    Medications:   Antimicrobials:  Linezolid 12/3 ->  Daptomycin 12/3 X 1  Oseltamivir 12/2 ->12/7  Aztreonam 12/3 -> 12/6  Azithromycin 12/3 -12/4   Metronidazole 12/3 x 1    Ppx: TMP/SMX, valacyclovir      Current/Prior immunomodulators:  ponatinib 30mg  monotherapy (held)    Other medications reviewed.        Vital Signs last 24 hours:  Core Temp:  [36.1 ??C (97 ??F)-39.3 ??C (102.7 ??F)] 39.3 ??C (102.7 ??F)  Heart Rate:  [86-115] 115  SpO2 Pulse:  [86-115] 115  Resp:  [17-28] 28  BP: (86-144)/(46-74) 144/74  MAP (mmHg):  [61-93] 93  A BP-2: (91-163)/(47-63) 163/62  MAP:  [61 mmHg-88 mmHg] 88 mmHg  FiO2 (%):  [50 %-80 %] 60 %  SpO2:  [85 %-98 %] 94 %    Physical Exam:   Patient Lines/Drains/Airways Status       Active Active Lines, Drains, & Airways       Name Placement date Placement time Site Days    ETT  7.5 03/07/21  2131  -- 3    CVC Triple Lumen 03/08/21 Non-tunneled Right Internal jugular 03/08/21  0132  Internal jugular  3    NG/OG Tube Feedings Center mouth 03/08/21  0106  Center mouth  3    Urethral Catheter 03/07/21  2200  --  3    Peripheral IV  03/06/21 Left Arm 03/06/21  --  Arm  5    Peripheral IV 03/08/21 Right Antecubital 03/08/21  0102  Antecubital  3    Arterial Line 03/07/21 Right Radial 03/07/21  1500  Radial  4                    Const [x]  vital signs above    []  NAD, non-toxic appearance []  Chronically ill-appearing, non-distressed  Ill-appearing,       Eyes [x]  Lids normal bilaterally, conjunctiva anicteric and noninjected OU     [x] PERRL  [] EOMI  Ill- appearing      ENMT [x]  Normal appearance of external nose and ears, no nasal discharge        []  MMM, no lesions on lips or gums [x]  No thrush, leukoplakia, oral lesions  []  Dentition good []  Edentulous []  Dental caries present  []  Hearing normal  []  TMs with good light reflexes bilaterally Intubated      Neck [x]  Neck of normal appearance and trachea midline        []  No thyromegaly, nodules, or tenderness   []  Full neck ROM        Lymph []  No LAD in neck     []  No LAD in supraclavicular area     []  No LAD in axillae   []  No LAD in epitrochlear chains     []  No LAD in inguinal areas        CV [x]  RRR            [x]  No peripheral edema     []  Pedal pulses intact   [x]  No abnormal heart sounds appreciated   [x]  Extremities WWP         Resp []  Normal WOB at rest    []  No breathlessness with speaking, no coughing  []  CTA anteriorly    []  CTA posteriorly    Diffuse rhonchi throughout anterolaterally, coarse crackles Right LLZ,      GI [x]  Normal inspection, NTND   [x]  NABS     []  No umbilical hernia on exam       []  No hepatosplenomegaly     []  Inspection of perineal and perianal areas normal        GU [x]  Normal external genitalia     [] No urinary catheter present in urethra   []  No CVA tenderness    []  No tenderness over renal allograft        MSK [x]  No clubbing or cyanosis of hands       []  No vertebral point tenderness  []  No focal tenderness or abnormalities on palpation of joints in RUE, LUE, RLE, or LLE        Skin [x]  No rashes, lesions, or ulcers of visualized skin     []  Skin warm and dry to palpation         Neuro [x]  Face expression symmetric  []  Sensation to light touch grossly intact throughout    []  Moves extremities equally    [x]  No tremor noted        []  CNs II-XII grossly intact     []  DTRs normal and symmetric throughout []  Gait unremarkable        Psych [x]  Appropriate affect       [x]  Fluent speech         []  Attentive, good eye contact  [x]  Oriented to person, place, time          []   Judgment and insight are appropriate           Data for Medical Decision Making     Recent Labs   Lab Units 03/11/21  1350 03/11/21  1346 03/11/21  1102 03/11/21  0416   WBC 10*9/L  --   --   --  13.6*   HEMOGLOBIN g/dL  --   --   --  9.0*   HEMOGLOBIN BG g/dL 9.3*  --   --   --    PLATELET COUNT (1) 10*9/L  --   --   --  262   NEUTRO ABS 10*9/L  --   --   --  11.8*   LYMPHO ABS 10*9/L  --   --   --  0.6*   EOSINO ABS 10*9/L  --   --   --  0.1   SODIUM WHOLE BLOOD mmol/L 137  --   --   --    SODIUM mmol/L  --  136  --  137   POTASSIUM WHOLE BLOOD mmol/L 4.0  --   --   --    POTASSIUM mmol/L  --  4.1  --  4.3   BUN mg/dL  --  55*  --  52*   CREATININE mg/dL  --  1.61*  --  0.96*   GLUCOSE mg/dL  --  045  --  409   CALCIUM mg/dL  --  8.4*  --  8.4*   MAGNESIUM mg/dL  --  2.5  --  2.6   PHOSPHORUS mg/dL  --   --   --  4.3   BILIRUBIN TOTAL mg/dL  --   --   --  1.2   AST U/L  --   --   --  15   ALT U/L  --   --   --  <7*   IGG mg/dL  --   --  811*  --      I reviewed and noted the following labs: persistent leukocytosis down trending 13.6. Anemia with Hgb 9 . Cr elevated 3.72 from baseline.    Microbiology:  Past cultures were reviewed in Epic and CareEverywhere.  Microbiology Results (last day)       Procedure Component Value Date/Time Date/Time    Blood Culture [[id number]]  (Normal) Collected: 03/06/21 0935    Lab Status: Preliminary result Specimen: Blood from 1 Peripheral Draw Updated: 03/10/21 1000     Blood Culture, Routine No Growth at 4 days    Blood Culture [[id number]]  (Normal) Collected: 03/06/21 0935    Lab Status: Preliminary result Specimen: Blood from 1 Peripheral Draw Updated: 03/10/21 1000     Blood Culture, Routine No Growth at 4 days    Lower Respiratory Culture [[id number]]  (Abnormal)  (Susceptibility) Collected: 03/07/21 2148    Lab Status: Preliminary result Specimen: SPUTUM EXPECTORATED Updated: 03/10/21 0726     Lower Respiratory Culture 2+ Methicillin resistant Staphylococcus aureus     Gram Stain <10  Epithelial cells per low power field      >25 PMNS/LPF      1+ Gram positive cocci      Acceptable for culture    Narrative:      Specimen Source: SPUTUM EXPECTORATED    Susceptibility       Methicillin resistant Staphylococcus aureus (1)       Antibiotic Interpretation Microscan Method Status    Nafcillin Resistant  KIRBY BAUER Preliminary     Methicillin-resistant staphylococci are resistant to  all currently available beta-lactam antibiotics EXCEPT ceftaroline.Contact Micro lab at [phone] to request ceftaroline susceptibility testing.       Erythromycin Resistant  KIRBY BAUER Preliminary    Clindamycin Resistant  KIRBY BAUER Preliminary    Doxycycline Susceptible  KIRBY BAUER Preliminary    Gentamicin Susceptible  KIRBY BAUER Preliminary     Gentamicin is used only in combination with other active agents that test susceptible       Linezolid Susceptible  KIRBY BAUER Preliminary    Trimethoprim + Sulfamethoxazole Susceptible  KIRBY BAUER Preliminary    Vancomycin Susceptible 2 MIC SUSCEPTIBILITY RESULT Preliminary    Fluoroquinolone No Interpretation  KIRBY BAUER Preliminary     Fluoroquinolones are not indicated for the treatment of staphylococcal infections, including MRSA.                           Blood Culture #1 [[id number]]  (Normal) Collected: 03/09/21 0458    Lab Status: Preliminary result Specimen: Blood from 1 Peripheral Draw Updated: 03/10/21 0545     Blood Culture, Routine No Growth at 24 hours    Blood Culture #2 [[id number]]  (Normal) Collected: 03/09/21 0458    Lab Status: Preliminary result Specimen: Blood from 1 Peripheral Draw Updated: 03/10/21 0545     Blood Culture, Routine No Growth at 24 hours            Imaging:  XR Chest 12/7  Similar patchy and confluent consolidation in the right lung, greatest in the mid zone. Persistent left lower lung consolidation.   Small right pleural effusion. No pneumothorax.  Stable cardiomediastinal silhouette.     XR Chest 12/5 : Interval increase in right hemithorax consolidation. Persistent retrocardiac consolidation.No pleural effusion or pneumothorax    XR Chest 12/4: Slight increase in nonspecific left basilar opacities. Patchy and confluent heterogeneous opacities in the right upper lung are similar.      XR Chest Portable 03/06/2021    Marked interval worsening of airspace consolidation in the right upper and midlung zones. The left lung is clear.  No pleural effusion or pneumothorax.  Stable cardiomediastinal silhouette.    I have reviewed the images from 03/11/21 and agree with the findings.    Additional Studies:   (12/3) EKG QTc 427

## 2021-03-12 NOTE — Unmapped (Signed)
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 *    Interval history: Adam [name] with PMHx of ALL, COPD, prior MRSA bacteremia requiring intubation, MDD, lower back pain, tobacco use hx, presenting with??nausea, vomiting, fever initially found to be hypotensive and tachycardic at oncologists office, started on linezolid.??SOB worsened??in subsequent 24hrs??prompting presentation to the ED.??Admitted to the MICU due to c/f??acute hypoxic respiratory failure secondary to influenza infection with superimposed PNA.??    24 hr inter: Rash noted on anterior abdomen. Weaned off dilaudid gtt. Now having BMs.    Neurological   Sedation   Intubated secondary to worsening hypoxia, s/p vecuronium x1. Continuing to wean sedation.  - Off dilaudid gtt   - Continue PRN dilaudid 2q 2hrs  - Continue scheduled Ativan 2 q4hrs  - Reduce schedule oxycodone to 10 q6hrs  - Continue gabapentin 100 q8hrs (dose reduced for renal function)    Chronic??Pain??- Peripheral neuropathy  Has chronic pain at baseline from??ALL. Follows with [facility].   - See above    MDD  Holding home clonazapam and nortriptyline   ??  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 Stable.     Pulmonary   AHRF 2/2 Influenza w/ superimposed MRSA PNA - s/p Intubation  S/p intubation 12/5 overnight. Patient previously proned, now transferred to supine. Patient FiO2 continues to decrease to 50% from 70% overnight. P/F ratio less than 150 currently, will assess at 16 hours. c/f ARDS given acute onset, bilateral infiltrates, low P/F ratio still in, no pulm edema. Patient FiO2 increased oveernight during bath, FiO2 increased to 80, since decreased to 60% in middle of the day. Will continue to ween O2 as tolerated   - Continue VC/AC  - Continue Abx as indicated below   - Droplet Precautions??  - Follow serial ABGs to ensure appropriately compensating for acidosis  - Consider CT chest if acute worsening of respiratory status, but otherwise imaging would not change current management.  ??  COPD  20 ppy tobacco use hx, followed by Pulmonology outpt. Per 01/25/21 no prior exacerbations. Utilizes home albuterol.   - O2 Goal 88-92%  - cont ANORO Ellipta  - Holding on steroids  - ABx as listed below  - prn duonebs  ??  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 Underwent unsuccessful thoracentesis, at the time referred to CT surgery, at the time didn't think warranted surgical intervention. Futility of POC bedside US of lungs discussed, may offer little change to clinical evaluation. Can consider further imaging if acute worsening of respiratory status.  - POCUS today    Cardiovascular   Sinus tachycardia??2/2 Sepsis:  - continue to wean norepi  Renal   AKI  Likely prerenal vs ATN. Has not resolved with increasing amounts of fluids so ATN more likely. Stable UOP.  - Consult nephrology, appreciate recommendations   - 1L LR and increase FWF to 100 q4hrs    Infectious Disease/Autoimmune   Influenza c/b MRSA PNA - Hx of MRSA Bacteremia  Leukocytosis with neutrophil predominance, febrile, myalgias. CXR reveals??alveolar process covering the entire R side, with signifcant progression from 12/2 CXR. Empirically given Linezolid/Aztreonam in the ED. 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 See infectious disease note by Dr. Cardell Peach from 03/06/21 for list of prior infections & notable antimicrobial intolerance/allergy list. MRSA screen positive, though BCx remain negative. [facility] from 12/4 is positive for MRSA. S/p Azithromycin 12/3-12/5, stopped given negative urine legionella.   - Infectious disease Immunocomp consulted, follow reccs.  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  History of DRESS syndrome??2/2 cephalosporin  - consulted ICID for reccs re: abx regimen  - Avoid beta lactams    Cultures:  Blood Culture, Routine (no units)   Date Value   03/09/2021 No Growth at 72 hours   03/09/2021 No Growth at 72 hours     Urine Culture, Comprehensive (no units)   Date Value   03/06/2021 NO GROWTH     Lower Respiratory Culture (no units)   Date Value   03/07/2021 2+ Methicillin resistant Staphylococcus aureus (A)     WBC (10*9/L)   Date Value   03/12/2021 16.5 (H)     WBC, UA (/HPF)   Date Value   03/06/2021 17 (H)          FEN/GI   Constipation-Possible residuals from tube feeds  Improved today. Having multiple BMs.  -Lactulose 20g q6h  -Reglan 10 mg IV TID  -Start senna 2 tablets BID  -Daily dulcolax until regular BM are made.     Malnutrition Assessment: Not done yet.  Body mass index is 32.06 kg/m??.  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 ??He subsequently had difficulty tolerating single agent dasatinib. S/p 4 cycles of ponatinib-blinatumomab.??He is now MRD-negative and on ponatinib monotherapy  - heme on board: Recc holding home TKI until malignant hematology evals for ready to resume. Patient's family will need update to bring in home supply then.    Hypogammaglobinemia  Reordered IVIG at slowest rate which patient had tolerated previously. Heme in agreement with this plan.   - IVIG today     DVT PPx  - subcutaneous heparin 5000 units q 8hr    Endocrine   NAI    Integumentary   Skin Rash over anterior abdomen   Pictures in media tab. Suspect due to injury during proning.  - Continue to monitor  - Wound care consult    -  WOCN consulted for high risk skin assessment Yes.  - cont pressure mitigating precautions per skin policy    Prophylaxis/LDA/Restraints/Consults   Can CVC be removed? N/A, no CVC present (including vascular catheter for HD or PLEX)   Can A-line be removed? No: frequent ABGs  Can Foley be removed? No: Need continuous I/O  Mobility plan: Step 1 - Range of motion    Feeding: Tube feeds at goal  Analgesia: Pain adequately controlled, weaning  Sedation SAT/SBT: N/A  Thromboembolic ppx: SQ heparin  Head of bed >30 degrees: Yes  Ulcer ppx: Yes, mechanical ventilation > 3 days, not on tube feeds  Glucose within target range: Yes, in range    Does patient need/have an active type/screen? No    RASS at goal? Yes  [place] Agitation Assessment Scale (RASS) : -2 (03/12/2021 12:00 PM)     Can antipsychotics be stopped? N/A, not on antipsychotics  CAM-ICU Result: Negative (11/09/2020  9:00 PM)      Would hospice care be appropriate for this patient? No, patient improving or expected to improve  Any unaddressed hospice/palliative care needs? no    Patient Lines/Drains/Airways Status     Active Active Lines, Drains, & Airways Name Placement date Placement time Site Days    ETT  7.5 03/07/21  2131  -- 4    CVC Triple Lumen 03/08/21 Non-tunneled Right Internal jugular 03/08/21  0132  Internal jugular  4    NG/OG Tube Feedings Center mouth 03/08/21  0106  Center mouth  4    Urethral Catheter 03/07/21  2200  --  4    Peripheral IV 03/06/21 Left Arm 03/06/21  --  Arm  6    Peripheral IV 03/08/21 Right Antecubital 03/08/21  0102  Antecubital  4    Arterial Line 03/07/21 Right Radial 03/07/21  1500  Radial  4              Patient Lines/Drains/Airways Status     Active Wounds     Name Placement date Placement time Site Days    Wound 03/09/21 Pressure Injury Nose Mid Stage 2 03/09/21  1005  Nose  3    Wound 03/09/21 Skin Tear Chest Left 03/09/21  1030  Chest  3                Goals of Care     Code Status: Full Code    Designated Healthcare Decision Maker:  Mr. Anctil current decisional capacity for healthcare decision-making is Full capacity. His designated Educational psychologist) is/are   HCDM (patient stated preference): [place], [place] - Spouse - [phone].      Subjective     See 24 hr int    Objective     Vitals - past 24 hours  Temp:  [37.1 ??C (98.8 ??F)-39.3 ??C (102.7 ??F)] 39.3 ??C (102.7 ??F)  Heart Rate:  [93-121] 111  SpO2 Pulse:  [95-121] 96  Resp:  [20-29] 29  BP: (93-144)/(53-74) 124/65  FiO2 (%):  [50 %-100 %] 50 %  SpO2:  [93 %-100 %] 95 % Intake/Output  I/O last 3 completed shifts:  In: 1568.9 [I.V.:98.9; NG/GT:1170; IV Piggyback:300]  Out: 1325 [Urine:1325]     Physical Exam:    General:??Appears comfortable, sedated in NAD   HEENT:??NCAT, reports no sinus tenderness. Moist mucus membranes.  CV:??sinus tachycardia, no m/r/g. No clubbing  Pulm:??Ventilator breath sounds  GI:??Abdomen distended, soft, non-tender. Bowel sounds present.  MSK:??No rigidity. Diffuse myalgia. No LE swelling/size discrepancy.  Skin:??Abrasion to left  anterior chest wall, no crepitus, linear erythema on anterior abdomen  Neuro:??Sedated, not able to follow commands or track.      Continuous Infusions:   ??? sodium chloride         Scheduled Medications:   ??? carboxymethylcellulose sodium  2 drop Both Eyes Q6H SCH   ??? chlorhexidine  15 mL Topical BID   ??? famotidine  20 mg Enteral tube: gastric  At bedtime   ??? gabapentin  100 mg Enteral tube: gastric  Q8H SCH   ??? heparin (porcine) for subcutaneous use  5,000 Units Subcutaneous Q8H SCH   ??? immun glob G(IgG)-pro-IgA 0-50  0.5 g/kg (Ideal) Intravenous Once   ??? flu vacc qs2022-23 6mos up(PF)  0.5 mL Intramuscular During hospitalization   ??? ipratropium-albuteroL  3 mL Nebulization Q6H (RT)   ??? lactulose  20 g Oral Q6H SCH   ??? linezolid  600 mg Intravenous Q12H [facility]   ??? LORazepam  2 mg Intravenous Q4H   ??? melatonin  3 mg Enteral tube: gastric  QPM   ??? metoclopramide  10 mg Intravenous [facility]   ??? oxyCODONE  10 mg Enteral tube: gastric  Q6H SCH   ??? senna  2 tablet Oral BID   ??? sulfamethoxazole-trimethoprim  1 tablet Oral 2 times per day on Sun Sat   ??? valACYclovir  500 mg Oral Daily       PRN medications:  acetaminophen, acetaminophen **AND** diphenhydrAMINE, albuterol, bisacodyL, Implement **AND** Care order/instruction **AND** Vital signs **AND** sodium chloride **AND** sodium chloride 0.9% **AND** diphenhydrAMINE **AND** famotidine **AND** meperidine **AND** methylPREDNISolone sodium succinate (PF) **AND** EPINEPHrine, haloperidol lactate, HYDROmorphone    Data/Imaging Review: Reviewed in Epic and personally interpreted on 03/12/2021. See EMR for detailed results.

## 2021-03-12 NOTE — Unmapped (Signed)
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 *          Cross cover summary     Wife at bedside identified new rashes on L chest and abdomen. Rash on L chest include two ulcerated lesions with blackened crust, asymmetric borders and surrounding erythema, non-draining. DDX includes pressure ulcer which is most likely, but consider ruling out infectious origin given immunocompromised status. Recommend day team consider dermatology or wound care consult for care.  0800 ABG stable, 0400 ABG with hypoxemia.  Worsening leukocytosis per 0400 CBC.  Continued fever.    Abdominal rash includes several erythematous linear slightly-raised patches. Wife at bedside states patient has been too sedated to have scratched himself.     Noted in photos below:            Juanda Chance, MD

## 2021-03-12 NOTE — Unmapped (Signed)
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  Per EMR, patient admitted on 03/06/21 with PMHx of ALL, COPD, prior MRSA bacteremia requiring intubation, MDD, lower back pain, tobacco use hx, presenting with??nausea, vomiting, fever initially found to be hypotensive and tachycardic at oncologists office, started on linezolid.??SOB worsened??in subsequent 24hrs??prompting presentation to the ED.??Admitted to the MICU due to c/f??acute hypoxic respiratory failure secondary to influenza infection with superimposed PNA.??  Patient assessed in MICU. He has remained supinated since last consult.   Focused assessment of abdomen and chest. The wound noted on the left upper chest was noted after supination on 03/09/21 with removal of silicone bordered foam dressing. Dried adherent blood noted. Dermagran gauze placed for moist wound healing.   Abdomen has striations noted. Likely a hyperemic response to pronation therapy, fevers and patient has red hair. Less concerned for an evolving wound.   Patient repositioned with RN. Fluidized positioner placed under head.        03/12/21 1048   Wound 03/09/21 Skin Tear Chest Left Date First Assessed/Time First Assessed: 03/09/21 1030   Present on Hospital Admission: No  Primary Wound Type: Skin Tear  Location: Chest  Wound Location Orientation: Left   Wound Image    Dressing Status      No dressing   Wound Length (cm) 3.7 cm   Wound Width (cm) 1.8 cm   Wound Depth (cm) 0.1 cm   Wound Surface Area (cm^2) 6.66 cm^2   Wound Volume (cm^3) 0.666 cm^3   Wound Bed   (Dried blood)   Odor None   Peri-wound Assessment      Intact   Exudate Amnt      None   Tunneling      No   Undermining     No   Treatments Cleansed/Irrigation   Dressing Hydrophilic dressing;Silicone foam bordered dressing       Continence Status:   Incontinence of bladder: Foley in place  Incontinent of bowel: incontinence pad    Moisture Associated Skin Damage:   - none noted     Lab Results   Component Value Date    WBC 16.5 (H) 03/12/2021    HGB 8.9 (L) 03/12/2021    HCT 26.9 (L) 03/12/2021    ESR 30 (H) 07/18/2020    CRP 44.0 (H) 12/28/2020    A1C 5.0 05/13/2020  GLU 112 03/12/2021    POCGLU 109 11/05/2020    ALBUMIN 1.4 (L) 03/12/2021    PROT 4.9 (L) 03/12/2021     Risk Factors:   - Friction/shear  - Immobility  - Lack of sensory perception  - Multiple co-morbidities  - pronation therapy    Braden Scale Score: 15         Support Surface:   - Low Air Loss - ICU    Type Debridement Completed By WOCN:  N/A    Teaching:  - n/a    WOCN Recommendations:   - See nursing orders for wound care instructions.  - Contact WOCN with questions, concerns, or wound deterioration.  - continue pressure injury prevention interventions and nutrition support.  - continue to pad ETT securement and wound with mepilex XT  - start dermagran gauze to left upper chest wound, change daily    Topical Therapy/Interventions:   - Hydrophilic Gauze  - Silicone bordered foam    Recommended Consults:  - Not Applicable    WOCN Follow Up:  - Weekly    Plan of Care Discussed With:   - Family  - RN primary    Supplies Ordered: No    Workup Time:   30 minutes Reconsult for questions/concerns/deterioration.   PAGE VIA  [facility] Directory  Available by Epic Chat   Renaye Rakers RN, BSN, CWOCN

## 2021-03-12 NOTE — Unmapped (Signed)
Respiratory Safety Check    Date: 03/11/2021     PPV Device at bedside:   [x]  Yes  []  Replaced  []  N/A    PPV mask at bedside:  [x]  Yes  []  Replaced  []  N/A    Checked humidifier, inspiratory limb, and expiratory limb:   [x]  Yes  []  Replaced  []  N/A    Backup trachs and supplies available at bedside:  []  Yes  []  Replaced   N/A  []   Documented trach airway form visible at bedside:   []  Yes  []  Replaced  [x]  N/A    iNO connected with PPV device:  []  Yes  []  Replaced  [x]  N/A

## 2021-03-12 NOTE — Unmapped (Signed)
Respiratory Safety Check    Date: 03/12/2021     PPV Device at bedside:   [x]  Yes  []  Replaced  []  N/A    PPV mask at bedside:  [x]  Yes  []  Replaced  []  N/A    Checked humidifier, inspiratory limb, and expiratory limb:   [x]  Yes  []  Replaced  []  N/A    Backup trachs and supplies available at bedside:  []  Yes  []  Replaced  [x]  N/A    Documented trach airway form visible at bedside:   []  Yes  []  Replaced  [x]  N/A    iNO connected with PPV device:  []  Yes  []  Replaced  [x]  N/A

## 2021-03-12 NOTE — Unmapped (Cosign Needed)
IMMUNOCOMPROMISED HOST INFECTIOUS DISEASE PROGRESS NOTE      Adam Keith is being seen in consultation at the request of [name], MD for evaluation of pneumonia.    Assessment/Recommendations:    Adam 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 Patient received linezolid, aztreonam and oseltamavir ( completed course 12/7)  since admission . Given the cultures did not grow gram negative pathogen aztreonam is stopped on 12/6 and he continues on linezolid. We are concerned that his persistent fevers may be secondary to a MRSA pulmonary abscess/ empyema, and recommend when possible for CT chest to evaluate this further. Patient tolerated IVIG 12/9, continues to be febrile.    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 krusei fungemia  - Prior chemotherapy: [id number] induction with dasatinib (vincristine, dexamethasone and dasatinib); C1D1 01/24/2020; difficulty tolerating single-agent dasatinib  - Current chemotherapy: blinatumomab (anti-CD19/CD3) + ponatinib (TKI) s/p C4 on 12/14/20; 01/2021 now on ponatinib monotherapy maintenance  - 01/11/21: Normocellular bone marrow (30% overall) with trilineage hematopoiesis and less than 1% blasts by manual aspirate differential; Flow cytometry MRD analysis reveals no definitive immunophenotypic evidence of residual B lymphoblastic leukemia; BCR-ABL p210 transcripts were detected at a level of 0.002 IS % ratio in bone marrow.    # Hypogammaglobulimia  - 11/01/20 IgG 266 declined IVIG  - 03/11/21 IgG 148 - IVIG infusion stopped as patient developed rigors, tachycardia and HTN during infusion despite pre-medication   - 03/12/21 IgG 213- completed IVIG with premedications and slower rate of infusion    # AKI  - Cr up to 3 from b/l ~1.6-1.8  - 12/9 Nephrology considering CRRT.     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  - 12/6 tracheal aspirate: MRSA  - 12/8 unable to tolerate IVIG , another trial today  Tx : Linezolid /Oseltamivir---> Linezolid/aztreonam/flagyl/azithromycin/oseltamivir 12/3 --> linezolid/aztreonam/osteltamivir 12/3 -->linezolid [name] 12/5--->linezolid 12/6    #Left upper chest, pressure ulcer from proning 03/11/21    Prior infections:  #MRSA RLL PNA c/b bacteremia 11/01/2020, probable right empyema 8/12/202; presumed relapsed RML MRSA pneumonia 11/18/2020  - 8/19 Failed thoracocentesis by IP  - 9/26 CT chest parenchyma without e/o infection, persistent but decreased loculated effusions on right  - s/p MRSA antibiotics until 01/04/2021  #Persistent right chest pain 12/29/20; Low grade fever, weakness 01/28/21  - 02/02/2021 - Dr. 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       Repeat blood cultures today  ?? Follow-up BCx 12/3 - NGTD  ?? Recommend CT chest/Bedside ultrasound to evaluate for pleural effusions/empyema when patient is stable (no obvious effusions on XR, but difficult to assess)     Monitoring for antimicrobial toxicities  ??? Monitor CBC with diff and CMP at least twice weekly    Treatment  # Influenza with superimposed MRSA bacterial pneumonia 03/05/21  ??? CONTINUE linezolid 600 mg bid for treatment of MRSA pneumonia - anticipate 10-14 day course or longer if there is evidence of empyema on CT     Prophylaxis  ??? Cont valacyclovir, bactrim  ??? Recommend COVID BV after discharge          The [facility] ID service will continue to follow.  Please page the ID Transplant/Liquid Oncology Fellow consult at [phone] with questions.  Patient was discussed with Dr Jimmye Norman.    Kandice Moos, MD  Infectious Diseases Fellow  [facility]    Interval HPI:      Source of information includes:  Electronic Medical Records.  History obtained from: EMR   Interval events since last encounter: remains intubated, FiO2 70%,  Intermittently febrile, tmax 38.6. Pressures otherwise stable.  Wife in attendance, updated about the clinical progress and provided reassurance.  ROS: intubated    Medications:   Antimicrobials:  Linezolid 12/3 ->  Daptomycin 12/3 X 1  Oseltamivir 12/2 ->12/7  Aztreonam 12/3 -> 12/6  Azithromycin 12/3 -12/4   Metronidazole 12/3 x 1    Ppx: TMP/SMX, valacyclovir    Current/Prior immunomodulators:  ponatinib 30mg  monotherapy (held)    Other medications reviewed.        Vital Signs last 24 hours:  Temp:  [37.1 ??C (98.8 ??F)-39.3 ??C (102.7 ??F)] 39.3 ??C (102.7 ??F)  Core Temp:  [34.2 ??C (93.6 ??F)-39.3 ??C (102.7 ??F)] 34.3 ??C (93.7 ??F)  Heart Rate:  [93-121] 111  SpO2 Pulse:  [95-121] 96  Resp:  [20-29] 29  BP: (93-144)/(53-74) 124/65  MAP (mmHg):  [65-93] 81  A BP-2: (98-171)/(49-81) 159/67  MAP:  [63 mmHg-106 mmHg] 92 mmHg  FiO2 (%):  [50 %-100 %] 50 %  SpO2:  [93 %-100 %] 95 %    Physical Exam:   Patient Lines/Drains/Airways Status     Active Active Lines, Drains, & Airways     Name Placement date Placement time Site Days    ETT  7.5 03/07/21  2131  -- 4    CVC Triple Lumen 03/08/21 Non-tunneled Right Internal jugular 03/08/21  0132  Internal jugular  4    NG/OG Tube Feedings Center mouth 03/08/21  0106  Center mouth  4    Urethral Catheter 03/07/21  2200  --  4    Peripheral IV 03/06/21 Left Arm 03/06/21  --  Arm  6    Peripheral IV 03/08/21 Right Antecubital 03/08/21  0102  Antecubital  4    Arterial Line 03/07/21 Right Radial 03/07/21  1500  Radial  4                Const [x]  vital signs above    []  NAD, non-toxic appearance []  Chronically ill-appearing, non-distressed  Ill-appearing,       Eyes [x]  Lids normal bilaterally, conjunctiva anicteric and noninjected OU     [x] PERRL  [] EOMI  Ill- appearing      ENMT [x]  Normal appearance of external nose and ears, no nasal discharge        []  MMM, no lesions on lips or gums [x]  No thrush, leukoplakia, oral lesions  []  Dentition good []  Edentulous []  Dental caries present  []  Hearing normal  []  TMs with good light reflexes bilaterally   Intubated      Neck [x]  Neck of normal appearance and trachea midline        []  No thyromegaly, nodules, or tenderness   []  Full neck ROM        Lymph []  No LAD in neck     []  No LAD in supraclavicular area     []  No LAD in axillae   []  No LAD in epitrochlear chains     []  No LAD in inguinal areas        CV [x]  RRR            [x]  No peripheral edema     []  Pedal pulses intact   [x]  No abnormal heart sounds appreciated   [x]  Extremities WWP         Resp []  Normal WOB at rest    []  No breathlessness with speaking, no coughing  []  CTA anteriorly    []  CTA posteriorly    Diffuse rhonchi throughout anterolaterally, coarse crackles Right LLZ,      GI [x]  Normal inspection, NTND   [x]  NABS     []  No umbilical hernia on exam       []  No hepatosplenomegaly     []  Inspection of perineal and perianal areas normal        GU [x]  Normal external genitalia     [] No urinary catheter present in urethra   []  No CVA tenderness    []  No tenderness over renal allograft        MSK [x]  No clubbing or cyanosis of hands       []  No vertebral point tenderness  []  No focal tenderness or abnormalities on palpation of joints in RUE, LUE, RLE, or LLE        Skin [x]  No rashes, lesions, or ulcers of visualized skin     []  Skin warm and dry to palpation         Neuro [x]  Face expression symmetric  []  Sensation to light touch grossly intact throughout    []  Moves extremities equally    [x]  No tremor noted        []  CNs II-XII grossly intact     []  DTRs normal and symmetric throughout []  Gait unremarkable        Psych [x]  Appropriate affect       [x]  Fluent speech         []  Attentive, good eye contact  [x]  Oriented to person, place, time          []   Judgment and insight are appropriate           Data for Medical Decision Making     Recent Labs   Lab Units 03/12/21  1151 03/12/21  0417 03/11/21  1346 03/11/21  1102   WBC 10*9/L  --  16.5*  --   --    HEMOGLOBIN g/dL  --  8.9*  --   --    HEMOGLOBIN BG   --   --    < >  --    PLATELET COUNT (1) 10*9/L  --  268  --   --    NEUTRO ABS 10*9/L  --  14.1*  --   --    LYMPHO ABS 10*9/L --  1.1  --   --    EOSINO ABS 10*9/L  --  0.1  --   --    SODIUM WHOLE BLOOD mmol/L 140  --    < >  --    SODIUM mmol/L  --  140   < >  --    POTASSIUM WHOLE BLOOD mmol/L 3.8  --    < >  --    POTASSIUM mmol/L  --  4.5   < >  --    BUN mg/dL  --  65*   < >  --    CREATININE mg/dL  --  3.87*   < >  --    GLUCOSE mg/dL  --  564   < >  --    CALCIUM mg/dL  --  8.0*   < >  --    MAGNESIUM mg/dL  --  2.5   < >  --    PHOSPHORUS mg/dL  --  4.7  --   --    BILIRUBIN TOTAL mg/dL  --  1.9*  --   --    AST U/L  --  71*  --   --    ALT U/L  --  21  --   --    IGG mg/dL  --   --   --  332*    < > = values in this interval not displayed.     I reviewed and noted the following labs: persistent leukocytosis 16.5 . Anemia with Hgb 8.9 . Cr elevated 3.85 from baseline.    Microbiology:  Past cultures were reviewed in Epic and CareEverywhere.  Microbiology Results (last day)       Procedure Component Value Date/Time Date/Time    Blood Culture [[id number]]  (Normal) Collected: 03/06/21 0935    Lab Status: Preliminary result Specimen: Blood from 1 Peripheral Draw Updated: 03/10/21 1000     Blood Culture, Routine No Growth at 4 days    Blood Culture [[id number]]  (Normal) Collected: 03/06/21 0935    Lab Status: Preliminary result Specimen: Blood from 1 Peripheral Draw Updated: 03/10/21 1000     Blood Culture, Routine No Growth at 4 days    Lower Respiratory Culture [[id number]]  (Abnormal)  (Susceptibility) Collected: 03/07/21 2148    Lab Status: Preliminary result Specimen: SPUTUM EXPECTORATED Updated: 03/10/21 0726     Lower Respiratory Culture 2+ Methicillin resistant Staphylococcus aureus     Gram Stain <10  Epithelial cells per low power field      >25 PMNS/LPF      1+ Gram positive cocci      Acceptable for culture    Narrative:      Specimen Source: SPUTUM EXPECTORATED    Susceptibility  Methicillin resistant Staphylococcus aureus (1)       Antibiotic Interpretation Microscan Method Status    Nafcillin Resistant  KIRBY BAUER Preliminary     Methicillin-resistant staphylococci are resistant to all currently available beta-lactam antibiotics EXCEPT ceftaroline.Contact Micro lab at [phone] to request ceftaroline susceptibility testing.       Erythromycin Resistant  KIRBY BAUER Preliminary    Clindamycin Resistant  KIRBY BAUER Preliminary    Doxycycline Susceptible  KIRBY BAUER Preliminary    Gentamicin Susceptible  KIRBY BAUER Preliminary     Gentamicin is used only in combination with other active agents that test susceptible       Linezolid Susceptible  KIRBY BAUER Preliminary    Trimethoprim + Sulfamethoxazole Susceptible  KIRBY BAUER Preliminary    Vancomycin Susceptible 2 MIC SUSCEPTIBILITY RESULT Preliminary    Fluoroquinolone No Interpretation  KIRBY BAUER Preliminary     Fluoroquinolones are not indicated for the treatment of staphylococcal infections, including MRSA.                           Blood Culture #1 [[id number]]  (Normal) Collected: 03/09/21 0458    Lab Status: Preliminary result Specimen: Blood from 1 Peripheral Draw Updated: 03/10/21 0545     Blood Culture, Routine No Growth at 24 hours    Blood Culture #2 [[id number]]  (Normal) Collected: 03/09/21 0458    Lab Status: Preliminary result Specimen: Blood from 1 Peripheral Draw Updated: 03/10/21 0545     Blood Culture, Routine No Growth at 24 hours            Imaging:  XR Chest 12/7  Similar patchy and confluent consolidation in the right lung, greatest in the mid zone. Persistent left lower lung consolidation.   Small right pleural effusion. No pneumothorax.  Stable cardiomediastinal silhouette.     XR Chest 12/5 : Interval increase in right hemithorax consolidation. Persistent retrocardiac consolidation.No pleural effusion or pneumothorax    XR Chest 12/4: Slight increase in nonspecific left basilar opacities. Patchy and confluent heterogeneous opacities in the right upper lung are similar.      XR Chest Portable 03/06/2021  Marked interval worsening of airspace consolidation in the right upper and midlung zones. The left lung is clear.  No pleural effusion or pneumothorax.  Stable cardiomediastinal silhouette.    I have reviewed the images from 03/12/21 and agree with the findings.    Additional Studies:   (12/3) EKG QTc 427

## 2021-03-12 NOTE — Unmapped (Signed)
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 *      Interval history: Adam [name] with PMHx of ALL, COPD, prior MRSA bacteremia requiring intubation, MDD, lower back pain, tobacco use hx, presenting with??nausea, vomiting, fever initially found to be hypotensive and tachycardic at oncologists office, started on linezolid.??SOB worsened??in subsequent 24hrs??prompting presentation to the ED.??Admitted to the MICU due to c/f??acute hypoxic respiratory failure secondary to influenza infection with superimposed PNA.??    24 hr inter: Patient unable to make BM, residuals noted to be at . Tube feeds increased to 20 mL Satrted bowel reg of Reglan, senna, and miralax. Chest x-ray showed properly placement after transfer to supine. Patient had reaction to IVIG, patient pretreated with benadryl and tylenol. Patient became hypertensive to 230s, tachycardic to 130s and developed rigors. Stopped infusion and patient returned to normal.     Neurological   Sedation while intubated  Intubated secondary to worsening hypoxia, s/p vecuronium x1. Current plan is to wean sedation. Patient has only became agitated once in last 24 hours, will continue diluadid gtt 2 mg and ativan 2 mg q4h to achieve RASS goal of -1 to -2    - pain plan per below    Chronic??Pain??- Peripheral neuropathy  Has chronic pain at baseline from??ALL. Follows with [facility]. Reduced oxycodone doses in efforts to not worsen respiratory drive. Reduced home gabapentin secondary to present AKI.   - Cont home oxycodone 10mg  q 12hr SCH   - Cont home gabapentin 300mg  TID SCH  - Continue SCH: dilaudid 2 gtt mg hr  - PRNs: dilaudid 2mg , tylenol 650mg  q 6  - Hold home Amytriptyline given delirium risk, low threshold to restart    MDD  Halved home klonopin to reduce efforts in worsening respiratory drive.  - Klonopin 0.5mg  Enterally BID  - Holding nortriptyline as above  ??  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 Stable.     Pulmonary   AHRF 2/2 Influenza w/ superimposed MRSA PNA - s/p Intubation  S/p intubation 12/5 overnight. Patient previously proned, now transferred to supine. Patient FiO2 continues to decrease to 50% from 70% overnight. P/F ratio less than 150 currently, will assess at 16 hours. c/f ARDS given acute onset, bilateral infiltrates, low P/F ratio still in, no pulm edema. Patient FiO2 increased oveernight during bath, FiO2 increased to 80, since decreased to 60% in middle of the day. Will continue to ween O2 as tolerated   - Continue VC/AC  - Continue Abx as indicated below   - Droplet Precautions??  - Follow serial ABGs to ensure appropriately compensating for acidosis  - Consider CT chest if acute worsening of respiratory status, but otherwise imaging would  not change current management.  ??  COPD  20 ppy tobacco use hx, followed by Pulmonology outpt. Per 01/25/21 no prior exacerbations. Utilizes home albuterol.   - O2 Goal 88-92%  - cont ANORO Ellipta  - Holding on steroids  - ABx as listed below  - prn duonebs  ??  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 Underwent unsuccessful thoracentesis, at the time referred to CT surgery, at the time didn't think warranted surgical intervention. Futility of POC bedside US of lungs discussed, may offer little change to clinical evaluation. Can consider further imaging if acute worsening of respiratory status.  - CTM    Cardiovascular   Sinus tachycardia??2/2 Sepsis:  - continue to wean norepi     Renal   AKI  Likely prerenal. Dry on exam. S/p 1L today. Creatinine uptrending to 3.4 today. Urine lytes ordered today. Patient urine output continues to decrease.  - f/u BMP  - Monitor I&O  - Avoid nephrotoxins    Infectious Disease/Autoimmune   Influenza c/b MRSA PNA - Hx of MRSA Bacteremia  Leukocytosis with neutrophil predominance, febrile, myalgias. CXR reveals??alveolar process covering the entire R side, with signifcant progression from 12/2 CXR. Empirically given Linezolid/Aztreonam in the ED. 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 See infectious disease note by Dr. Cardell Peach from 03/06/21 for list of prior infections & notable antimicrobial intolerance/allergy list. MRSA screen positive, though BCx remain negative. [facility] from 12/4 is positive for MRSA. S/p Azithromycin 12/3-12/5, stopped given negative urine legionella.   - Infectious disease Immunocomp consulted, follow reccs.  - Completed Tamiflu (12/3-12/7)  - Continue Linezolid (12/3-9)  -Discontinue Aztreonam (12/3-12/6)  - follow up BCx: Neg at 96 hrs and 12/6 Neg at 24 hr  - consider CT Chest to further eval lung pathology per Heme/Onc 12/4 reccs    ??  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  History of DRESS syndrome??2/2 cephalosporin  - consulted ICID for reccs re: abx regimen  - Avoid beta lactams    Cultures:  Blood Culture, Routine (no units)   Date Value   03/09/2021 No Growth at 48 hours   03/09/2021 No Growth at 48 hours     Urine Culture, Comprehensive (no units)   Date Value   03/06/2021 NO GROWTH     Lower Respiratory Culture (no units)   Date Value   03/07/2021 2+ Methicillin resistant Staphylococcus aureus (A)     WBC (10*9/L)   Date Value   03/11/2021 13.6 (H)     WBC, UA (/HPF)   Date Value   03/06/2021 17 (H)              FEN/GI   Hypomagnesemia  Found to be 1.2 on admission, repleted and wnl on repeat check.  - Maintain value > 2.0  - CTM  ??  Hyponatremia - Hyperkalemic to 135 and 4.2 respectively on labs today.   - CTM    Constipation-Possible residuals from tube feeds  Patient has not made BM for several days. Given patient's current status will decrease tube feeds to 37ml/hr. Started bowel regimen of Miralax BID, senna, and dulcolax. Reglan started to bowel motility. May consider methylnaltrexone if patient is unable to stool by tomorrow, constipation maybe  -Start lactulose 20g q6h  -  Reglan 10 mg IV TID  -Start senna 2 tablets BID  -Daily dulcolax until regular BM are made.       Malnutrition Assessment: Not done yet.  Body mass index is 32.06 kg/m??.  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 ??He subsequently had difficulty tolerating single agent dasatinib. S/p 4 cycles of ponatinib-blinatumomab.??He is now MRD-negative and on ponatinib monotherapy  - heme on board: Recc holding home TKI until malignant hematology evals for ready to resume. Patient's family will need update to bring in home supply then.    Hypogammaglobinemia  Attempted IVIG infusion yesterday, patient had reaction after 2 titrations. Patient experienced tachycardia, hypertension, rigors. Infusion stopped, Heme notified and discussed stopping infusion. IVIG recheck today 213 from 144 previously. Will retouch base with heme about plan going forward. Can potentially start infusion at low-dose flat rate.     ??  DVT PPx  - subcutaneous heparin 5000 units q 8hr    Endocrine   NAI    Integumentary     #  - WOCN consulted for high risk skin assessment No. Reason: Not indicated.  - cont pressure mitigating precautions per skin policy    Prophylaxis/LDA/Restraints/Consults   Can CVC be removed? N/A, no CVC present (including vascular catheter for HD or PLEX)   Can A-line be removed? No: frequent ABGs  Can Foley be removed? No: Need continuous I/O  Mobility plan: Step 1 - Range of motion    Feeding: Tube feeds at goal  Analgesia: Pain adequately controlled  Sedation SAT/SBT: N/A  Thromboembolic ppx: SQ heparin  Head of bed >30 degrees: Yes  Ulcer ppx: Yes, mechanical ventilation > 3 days, not on tube feeds  Glucose within target range: Yes, in range    Does patient need/have an active type/screen? No    RASS at goal? Yes  [place] Agitation Assessment Scale (RASS) : -2 (03/11/2021 12:00 PM)     Can antipsychotics be stopped? N/A, not on antipsychotics  CAM-ICU Result: Negative (11/09/2020  9:00 PM)      Would hospice care be appropriate for this patient? No, patient improving or expected to improve  Any unaddressed hospice/palliative care needs? no    Patient Lines/Drains/Airways Status     Active Active Lines, Drains, & Airways     Name Placement date Placement time Site Days    ETT  7.5 03/07/21  2131  -- 3    CVC Triple Lumen 03/08/21 Non-tunneled Right Internal jugular 03/08/21  0132  Internal jugular  3    NG/OG Tube Feedings Center mouth 03/08/21  0106  Center mouth  3    Urethral Catheter 03/07/21  2200  --  3    Peripheral IV 03/06/21 Left Arm 03/06/21  --  Arm  5    Peripheral IV 03/08/21 Right Antecubital 03/08/21  0102  Antecubital  3    Arterial Line 03/07/21 Right Radial 03/07/21  1500  Radial  4              Patient Lines/Drains/Airways Status     Active Wounds     Name Placement date Placement time Site Days    Wound 03/09/21 Pressure Injury Nose Mid Stage 2 03/09/21  1005  Nose  2 Goals of Care  Code Status: Full Code    Librarian, academic:  Adam Keith current decisional capacity for healthcare decision-making is Full capacity. His designated Educational psychologist) is/are   HCDM (patient stated preference): Jourdin, Connors - Spouse - [phone].      Subjective     See 24 hr int    Objective     Vitals - past 24 hours  Heart Rate:  [86-118] 115  SpO2 Pulse:  [86-118] 115  Resp:  [17-28] 28  BP: (86-144)/(46-74) 144/74  FiO2 (%):  [50 %-80 %] 60 %  SpO2:  [85 %-98 %] 94 % Intake/Output  I/O last 3 completed shifts:  In: 1842.4 [I.V.:137.4; NG/GT:1205; IV Piggyback:500]  Out: 1675 [Urine:1675]     Physical Exam:    General:??Appears comfortable, sedated in NAD   HEENT:??NCAT, reports no sinus tenderness. Moist mucus membranes.  CV:??sinus tachycardia, no m/r/g. No clubbing  Pulm:??Ventilator breath sounds  GI:??Abdomen distended, soft, non-tender. Bowel sounds present.  MSK:??able to freely move each extremity. No rigidity. Diffuse myalgia. No LE swelling/size discrepancy.  Skin:??No apparent rash. No evident bruising.??  Neuro:??Sedated, not able to follow commands or track.      Continuous Infusions:   ??? HYDROmorphone 2 mg/hr (03/11/21 1104)   ??? norepinephrine bitartrate-NS 0 mcg/min (03/11/21 1357)       Scheduled Medications:   ??? carboxymethylcellulose sodium  2 drop Both Eyes Q6H SCH   ??? chlorhexidine  15 mL Topical BID   ??? famotidine  20 mg Enteral tube: gastric  At bedtime   ??? gabapentin  300 mg Enteral tube: gastric  [facility]   ??? heparin (porcine) for subcutaneous use  5,000 Units Subcutaneous Q8H [facility]   ??? flu vacc qs2022-23 6mos up(PF)  0.5 mL Intramuscular During hospitalization   ??? ipratropium-albuteroL  3 mL Nebulization Q6H (RT)   ??? lactulose  20 g Oral Q6H SCH   ??? linezolid  600 mg Intravenous Q12H [facility]   ??? LORazepam  2 mg Intravenous Q4H   ??? melatonin  3 mg Enteral tube: gastric  QPM   ??? metoclopramide  10 mg Intravenous [facility]   ??? oxyCODONE  10 mg Enteral tube: gastric  Q4H   ??? senna  2 tablet Oral BID   ??? sulfamethoxazole-trimethoprim  1 tablet Oral 2 times per day on Sun Sat   ??? valACYclovir  500 mg Oral Daily       PRN medications:  acetaminophen, albuterol, haloperidol lactate, HYDROmorphone    Data/Imaging Review: Reviewed in Epic and personally interpreted on 03/11/2021. See EMR for detailed results.

## 2021-03-12 NOTE — Unmapped (Addendum)
Pt rested calmly throughout shift. Became more alert as shift progressed. Titrated off levo and down on dilaudid. 1L LR given. Wife and mother updated at bedside. Pt spike fever this afternoon, MD notified, ice packs applied and tylenol given.  Problem: Adult Inpatient Plan of Care  Goal: Plan of Care Review  Outcome: Ongoing - Unchanged  Goal: Patient-Specific Goal (Individualized)  Outcome: Ongoing - Unchanged  Goal: Absence of Hospital-Acquired Illness or Injury  Outcome: Ongoing - Unchanged  Intervention: Identify and Manage Fall Risk  Recent Flowsheet Documentation  Taken 03/11/2021 0800 by Franne Grip, RN  Safety Interventions:  ??? aspiration precautions  ??? bed alarm  ??? enteral feeding safety  ??? fall reduction program maintained  ??? family at bedside  ??? isolation precautions  ??? lighting adjusted for tasks/safety  ??? room near unit station  ??? nonskid shoes/slippers when out of bed  Intervention: Prevent Skin Injury  Recent Flowsheet Documentation  Taken 03/11/2021 1600 by Franne Grip, RN  Skin Protection:  ??? adhesive use limited  ??? incontinence pads utilized  Taken 03/11/2021 1400 by Franne Grip, RN  Skin Protection:  ??? adhesive use limited  ??? incontinence pads utilized  Taken 03/11/2021 0800 by Franne Grip, RN  Skin Protection:  ??? adhesive use limited  ??? incontinence pads utilized  Intervention: Prevent and Manage VTE (Venous Thromboembolism) Risk  Recent Flowsheet Documentation  Taken 03/11/2021 1600 by Franne Grip, RN  Activity Management: bedrest  Taken 03/11/2021 1400 by Franne Grip, RN  Activity Management: bedrest  Taken 03/11/2021 1000 by Franne Grip, RN  Activity Management: bedrest  Taken 03/11/2021 0800 by Franne Grip, RN  Activity Management: bedrest  Intervention: Prevent Infection  Recent Flowsheet Documentation  Taken 03/11/2021 0800 by Franne Grip, RN  Infection Prevention:  ??? environmental surveillance performed  ??? equipment surfaces disinfected  ??? hand hygiene promoted  ??? rest/sleep promoted  ??? personal protective equipment utilized  ??? single patient room provided  Goal: Optimal Comfort and Wellbeing  Outcome: Ongoing - Unchanged  Goal: Readiness for Transition of Care  Outcome: Ongoing - Unchanged  Goal: Rounds/Family Conference  Outcome: Ongoing - Unchanged     Problem: Infection  Goal: Absence of Infection Signs and Symptoms  Outcome: Ongoing - Unchanged  Intervention: Prevent or Manage Infection  Recent Flowsheet Documentation  Taken 03/11/2021 0800 by Franne Grip, RN  Infection Management: aseptic technique maintained  Isolation Precautions:  ??? contact precautions maintained  ??? droplet precautions maintained     Problem: Skin Injury Risk Increased  Goal: Skin Health and Integrity  Outcome: Ongoing - Unchanged  Intervention: Optimize Skin Protection  Recent Flowsheet Documentation  Taken 03/11/2021 1600 by Franne Grip, RN  Pressure Reduction Techniques:  ??? weight shift assistance provided  ??? rest period provided between sit times  ??? pressure points protected  Head of Bed (HOB) Positioning: HOB at 30-45 degrees  Pressure Reduction Devices: pressure-redistributing mattress utilized  Skin Protection:  ??? adhesive use limited  ??? incontinence pads utilized  Taken 03/11/2021 1400 by Franne Grip, RN  Pressure Reduction Techniques:  ??? weight shift assistance provided  ??? rest period provided between sit times  ??? pressure points protected  Head of Bed (HOB) Positioning: HOB at 30-45 degrees  Pressure Reduction Devices: pressure-redistributing mattress utilized  Skin Protection:  ??? adhesive use limited  ??? incontinence pads utilized  Taken 03/11/2021 1200 by Franne Grip, RN  Pressure Reduction Techniques:  ??? pressure points protected  ??? weight shift assistance provided  Pressure Reduction Devices:  ???  positioning supports utilized  ??? pressure-redistributing mattress utilized  Taken 03/11/2021 1000 by Franne Grip, RN  Pressure Reduction Techniques:  ??? weight shift assistance provided  ??? pressure points protected  Head of Bed (HOB) Positioning: HOB at 30-45 degrees  Pressure Reduction Devices: pressure-redistributing mattress utilized  Taken 03/11/2021 0800 by Franne Grip, RN  Pressure Reduction Techniques:  ??? weight shift assistance provided  ??? pressure points protected  ??? positioned off wounds  Head of Bed (HOB) Positioning: HOB at 30-45 degrees  Pressure Reduction Devices:  ??? pressure-redistributing mattress utilized  ??? positioning supports utilized  Skin Protection:  ??? adhesive use limited  ??? incontinence pads utilized     Problem: Fluid Imbalance (Pneumonia)  Goal: Fluid Balance  Outcome: Ongoing - Unchanged     Problem: Infection (Pneumonia)  Goal: Resolution of Infection Signs and Symptoms  Outcome: Ongoing - Unchanged  Intervention: Prevent Infection Progression  Recent Flowsheet Documentation  Taken 03/11/2021 0800 by Franne Grip, RN  Infection Management: aseptic technique maintained  Isolation Precautions:  ??? contact precautions maintained  ??? droplet precautions maintained     Problem: Respiratory Compromise (Pneumonia)  Goal: Effective Oxygenation and Ventilation  Outcome: Ongoing - Unchanged  Intervention: Optimize Oxygenation and Ventilation  Recent Flowsheet Documentation  Taken 03/11/2021 1600 by Franne Grip, RN  Head of Bed [facility]) Positioning: HOB at 30-45 degrees  Taken 03/11/2021 1400 by Franne Grip, RN  Head of Bed [facility]) Positioning: HOB at 30-45 degrees  Taken 03/11/2021 1000 by Franne Grip, RN  Head of Bed [facility]) Positioning: HOB at 30-45 degrees  Taken 03/11/2021 0800 by Franne Grip, RN  Head of Bed [facility]) Positioning: HOB at 30-45 degrees     Problem: Pain Chronic (Persistent) (Comorbidity Management)  Goal: Acceptable Pain Control and Functional Ability  Outcome: Ongoing - Unchanged     Problem: Self-Care Deficit  Goal: Improved Ability to Complete Activities of Daily Living  Outcome: Ongoing - Unchanged     Problem: Non-Violent Restraints  Goal: Patient will remain free of restraint events  Outcome: Ongoing - Unchanged  Goal: Patient will remain free of physical injury  Outcome: Ongoing - Unchanged     Problem: Communication Impairment (Mechanical Ventilation, Invasive)  Goal: Effective Communication  Outcome: Ongoing - Unchanged     Problem: Device-Related Complication Risk (Mechanical Ventilation, Invasive)  Goal: Optimal Device Function  Outcome: Ongoing - Unchanged  Intervention: Optimize Device Care and Function  Recent Flowsheet Documentation  Taken 03/11/2021 0800 by Franne Grip, RN  Aspiration Precautions:  ??? oral hygiene care promoted  ??? tube feeding placement verified     Problem: Inability to Wean (Mechanical Ventilation, Invasive)  Goal: Mechanical Ventilation Liberation  Outcome: Ongoing - Unchanged     Problem: Nutrition Impairment (Mechanical Ventilation, Invasive)  Goal: Optimal Nutrition Delivery  Outcome: Ongoing - Unchanged     Problem: Skin and Tissue Injury (Mechanical Ventilation, Invasive)  Goal: Absence of Device-Related Skin and Tissue Injury  Outcome: Ongoing - Unchanged  Intervention: Maintain Skin and Tissue Health  Recent Flowsheet Documentation  Taken 03/11/2021 0800 by Franne Grip, RN  Device Skin Pressure Protection:  ??? absorbent pad utilized/changed  ??? mittens applied to hands     Problem: Ventilator-Induced Lung Injury (Mechanical Ventilation, Invasive)  Goal: Absence of Ventilator-Induced Lung Injury  Outcome: Ongoing - Unchanged  Intervention: Prevent Ventilator-Associated Pneumonia  Recent Flowsheet Documentation  Taken 03/11/2021 1600 by Franne Grip, RN  Head of Bed [facility]) Positioning: HOB at 30-45 degrees  Oral Care:  ??? mouth swabbed  ???  suction provided  ??? teeth brushed  ??? tongue brushed  Taken 03/11/2021 1400 by Franne Grip, RN  Head of Bed [facility]) Positioning: HOB at 30-45 degrees  Taken 03/11/2021 1200 by Franne Grip, RN  Oral Care:  ??? mouth swabbed  ??? suction provided  ??? teeth brushed  Taken 03/11/2021 1000 by Franne Grip, RN  Head of Bed [facility]) Positioning: HOB at 30-45 degrees  Oral Care:  ??? mouth swabbed  ??? suction provided  Taken 03/11/2021 0800 by Franne Grip, RN  Head of Bed [facility]) Positioning: HOB at 30-45 degrees  Oral Care:  ??? oral rinse provided  ??? mouth swabbed  ??? teeth brushed  ??? tongue brushed  ??? suction provided     Problem: Impaired Wound Healing  Goal: Optimal Wound Healing  Outcome: Ongoing - Unchanged  Intervention: Promote Wound Healing  Recent Flowsheet Documentation  Taken 03/11/2021 1600 by Franne Grip, RN  Activity Management: bedrest  Taken 03/11/2021 1400 by Franne Grip, RN  Activity Management: bedrest  Taken 03/11/2021 1000 by Franne Grip, RN  Activity Management: bedrest  Taken 03/11/2021 0800 by Franne Grip, RN  Activity Management: bedrest

## 2021-03-12 NOTE — Unmapped (Signed)
Fio2 dec 55%. Norepi restarted to maintain map >65. O2 sats & bp lower with rside turns.     Problem: Adult Inpatient Plan of Care  Goal: Plan of Care Review  Outcome: Progressing  Goal: Patient-Specific Goal (Individualized)  Outcome: Progressing  Goal: Absence of Hospital-Acquired Illness or Injury  Outcome: Progressing  Intervention: Identify and Manage Fall Risk  Recent Flowsheet Documentation  Taken 03/11/2021 2000 by Noland Fordyce, RN  Safety Interventions: aspiration precautions  Intervention: Prevent Skin Injury  Recent Flowsheet Documentation  Taken 03/11/2021 2000 by Noland Fordyce, RN  Skin Protection: adhesive use limited  Intervention: Prevent and Manage VTE (Venous Thromboembolism) Risk  Recent Flowsheet Documentation  Taken 03/11/2021 2000 by Noland Fordyce, RN  Activity Management: bedrest  Range of Motion: Bilateral Upper and Lower Extremities  Intervention: Prevent Infection  Recent Flowsheet Documentation  Taken 03/11/2021 2000 by Noland Fordyce, RN  Infection Prevention: environmental surveillance performed  Goal: Optimal Comfort and Wellbeing  Outcome: Progressing  Goal: Readiness for Transition of Care  Outcome: Progressing  Goal: Rounds/Family Conference  Outcome: Progressing

## 2021-03-13 LAB — BLOOD GAS, ARTERIAL
BASE EXCESS ARTERIAL: -2 (ref -2.0–2.0)
BASE EXCESS ARTERIAL: -3.5 — ABNORMAL LOW (ref -2.0–2.0)
BASE EXCESS ARTERIAL: -2.8 — ABNORMAL LOW (ref -2.0–2.0)
BASE EXCESS ARTERIAL: -1.2 (ref -2.0–2.0)
HCO3 ARTERIAL: 23 mmol/L (ref 22–27)
HCO3 ARTERIAL: 21 mmol/L — ABNORMAL LOW (ref 22–27)
HCO3 ARTERIAL: 21 mmol/L — ABNORMAL LOW (ref 22–27)
HCO3 ARTERIAL: 23 mmol/L (ref 22–27)
O2 SATURATION ARTERIAL: 97.2 % (ref 94.0–100.0)
O2 SATURATION ARTERIAL: 96.9 % (ref 94.0–100.0)
O2 SATURATION ARTERIAL: 95.1 % (ref 94.0–100.0)
O2 SATURATION ARTERIAL: 94.9 % (ref 94.0–100.0)
PCO2 ARTERIAL: 36.5 mmHg (ref 35.0–45.0)
PCO2 ARTERIAL: 33.9 mmHg — ABNORMAL LOW (ref 35.0–45.0)
PCO2 ARTERIAL: 31.6 mmHg — ABNORMAL LOW (ref 35.0–45.0)
PCO2 ARTERIAL: 36.5 mmHg (ref 35.0–45.0)
PH ARTERIAL: 7.4 (ref 7.35–7.45)
PH ARTERIAL: 7.4 (ref 7.35–7.45)
PH ARTERIAL: 7.43 (ref 7.35–7.45)
PH ARTERIAL: 7.41 (ref 7.35–7.45)
PO2 ARTERIAL: 79.6 mmHg — ABNORMAL LOW (ref 80.0–110.0)
PO2 ARTERIAL: 78.6 mmHg — ABNORMAL LOW (ref 80.0–110.0)
PO2 ARTERIAL: 74.2 mmHg — ABNORMAL LOW (ref 80.0–110.0)
PO2 ARTERIAL: 74.5 mmHg — ABNORMAL LOW (ref 80.0–110.0)

## 2021-03-13 LAB — MAGNESIUM
MAGNESIUM: 2.2 mg/dL (ref 1.6–2.6)
MAGNESIUM: 2 mg/dL (ref 1.6–2.6)

## 2021-03-13 LAB — CBC W/ AUTO DIFF
BASOPHILS ABSOLUTE COUNT: 0.4 10*9/L — ABNORMAL HIGH (ref 0.0–0.1)
BASOPHILS RELATIVE PERCENT: 2.3 %
EOSINOPHILS ABSOLUTE COUNT: 0.1 10*9/L (ref 0.0–0.5)
EOSINOPHILS RELATIVE PERCENT: 0.5 %
HEMATOCRIT: 27.6 % — ABNORMAL LOW (ref 39.0–48.0)
HEMOGLOBIN: 9 g/dL — ABNORMAL LOW (ref 12.9–16.5)
LYMPHOCYTES ABSOLUTE COUNT: 0.9 10*9/L — ABNORMAL LOW (ref 1.1–3.6)
LYMPHOCYTES RELATIVE PERCENT: 5.5 %
MEAN CORPUSCULAR HEMOGLOBIN CONC: 32.7 g/dL (ref 32.0–36.0)
MEAN CORPUSCULAR HEMOGLOBIN: 28.2 pg (ref 25.9–32.4)
MEAN CORPUSCULAR VOLUME: 86.4 fL (ref 77.6–95.7)
MEAN PLATELET VOLUME: 7.8 fL (ref 6.8–10.7)
MONOCYTES ABSOLUTE COUNT: 1 10*9/L — ABNORMAL HIGH (ref 0.3–0.8)
MONOCYTES RELATIVE PERCENT: 5.8 %
NEUTROPHILS ABSOLUTE COUNT: 14.7 10*9/L — ABNORMAL HIGH (ref 1.8–7.8)
NEUTROPHILS RELATIVE PERCENT: 85.9 %
PLATELET COUNT: 266 10*9/L (ref 150–450)
RED BLOOD CELL COUNT: 3.19 10*12/L — ABNORMAL LOW (ref 4.26–5.60)
RED CELL DISTRIBUTION WIDTH: 17.8 % — ABNORMAL HIGH (ref 12.2–15.2)
WBC ADJUSTED: 17.1 10*9/L — ABNORMAL HIGH (ref 3.6–11.2)

## 2021-03-13 LAB — HEPATIC FUNCTION PANEL
ALBUMIN: 1.7 g/dL — ABNORMAL LOW (ref 3.4–5.0)
ALKALINE PHOSPHATASE: 197 U/L — ABNORMAL HIGH (ref 46–116)
ALT (SGPT): 24 U/L (ref 10–49)
AST (SGOT): 69 U/L — ABNORMAL HIGH (ref <=34)
BILIRUBIN DIRECT: 1.1 mg/dL — ABNORMAL HIGH (ref 0.00–0.30)
BILIRUBIN TOTAL: 1.4 mg/dL — ABNORMAL HIGH (ref 0.3–1.2)
PROTEIN TOTAL: 6.3 g/dL (ref 5.7–8.2)

## 2021-03-13 LAB — BASIC METABOLIC PANEL
ANION GAP: 9 mmol/L (ref 5–14)
ANION GAP: 11 mmol/L (ref 5–14)
BLOOD UREA NITROGEN: 64 mg/dL — ABNORMAL HIGH (ref 9–23)
BLOOD UREA NITROGEN: 63 mg/dL — ABNORMAL HIGH (ref 9–23)
BUN / CREAT RATIO: 20
BUN / CREAT RATIO: 23
CALCIUM: 8.5 mg/dL — ABNORMAL LOW (ref 8.7–10.4)
CALCIUM: 8.7 mg/dL (ref 8.7–10.4)
CHLORIDE: 107 mmol/L (ref 98–107)
CHLORIDE: 108 mmol/L — ABNORMAL HIGH (ref 98–107)
CO2: 22 mmol/L (ref 20.0–31.0)
CO2: 22 mmol/L (ref 20.0–31.0)
CREATININE: 3.13 mg/dL — ABNORMAL HIGH
CREATININE: 2.75 mg/dL — ABNORMAL HIGH
EGFR CKD-EPI (2021) MALE: 25 mL/min/{1.73_m2} — ABNORMAL LOW (ref >=60)
EGFR CKD-EPI (2021) MALE: 29 mL/min/{1.73_m2} — ABNORMAL LOW (ref >=60)
GLUCOSE RANDOM: 118 mg/dL (ref 70–179)
GLUCOSE RANDOM: 104 mg/dL (ref 70–179)
POTASSIUM: 3.8 mmol/L (ref 3.4–4.8)
POTASSIUM: 3.9 mmol/L (ref 3.4–4.8)
SODIUM: 138 mmol/L (ref 135–145)
SODIUM: 141 mmol/L (ref 135–145)

## 2021-03-13 LAB — PHOSPHORUS: PHOSPHORUS: 4.5 mg/dL (ref 2.4–5.1)

## 2021-03-13 MED ADMIN — gabapentin (NEURONTIN) oral solution: 100 mg | GASTROENTERAL | @ 19:00:00

## 2021-03-13 MED ADMIN — melatonin tablet 3 mg: 3 mg | GASTROENTERAL | @ 23:00:00

## 2021-03-13 MED ADMIN — ipratropium-albuteroL (DUO-NEB) 0.5-2.5 mg/3 mL nebulizer solution 3 mL: 3 mL | RESPIRATORY_TRACT | @ 08:00:00

## 2021-03-13 MED ADMIN — chlorhexidine (PERIDEX) 0.12 % solution 15 mL: 15 mL | TOPICAL | @ 02:00:00

## 2021-03-13 MED ADMIN — linezolid in dextrose 5% (ZYVOX) 600 mg/300 mL IVPB 600 mg: 600 mg | INTRAVENOUS | @ 14:00:00 | Stop: 2021-03-19

## 2021-03-13 MED ADMIN — ondansetron (ZOFRAN) injection 4 mg: 4 mg | INTRAVENOUS | @ 13:00:00 | Stop: 2021-03-13

## 2021-03-13 MED ADMIN — acetaminophen (TYLENOL) tablet 650 mg: 650 mg | ORAL | @ 23:00:00

## 2021-03-13 MED ADMIN — HYDROmorphone (PF) (DILAUDID) injection 2 mg: 2 mg | INTRAVENOUS | @ 20:00:00 | Stop: 2021-03-21

## 2021-03-13 MED ADMIN — valACYclovir (VALTREX) tablet 500 mg: 500 mg | ORAL | @ 14:00:00

## 2021-03-13 MED ADMIN — acetaminophen (TYLENOL) tablet 650 mg: 650 mg | ORAL | @ 08:00:00

## 2021-03-13 MED ADMIN — carboxymethylcellulose sodium (THERATEARS) 0.25 % ophthalmic solution 2 drop: 2 [drp] | OPHTHALMIC | @ 17:00:00

## 2021-03-13 MED ADMIN — sulfamethoxazole-trimethoprim (BACTRIM DS) 800-160 mg tablet 160 mg of trimethoprim: 1 | ORAL | @ 14:00:00 | Stop: 2021-03-28

## 2021-03-13 MED ADMIN — metoclopramide (REGLAN) injection 10 mg: 10 mg | INTRAVENOUS | @ 19:00:00

## 2021-03-13 MED ADMIN — carboxymethylcellulose sodium (THERATEARS) 0.25 % ophthalmic solution 2 drop: 2 [drp] | OPHTHALMIC | @ 23:00:00

## 2021-03-13 MED ADMIN — oxyCODONE (ROXICODONE) immediate release tablet 10 mg: 10 mg | GASTROENTERAL | @ 17:00:00 | Stop: 2021-03-19

## 2021-03-13 MED ADMIN — lactulose oral solution (30 mL cup): 20 g | ORAL | @ 17:00:00

## 2021-03-13 MED ADMIN — carboxymethylcellulose sodium (THERATEARS) 0.25 % ophthalmic solution 2 drop: 2 [drp] | OPHTHALMIC | @ 06:00:00

## 2021-03-13 MED ADMIN — HYDROmorphone (PF) (DILAUDID) injection 2 mg: 2 mg | INTRAVENOUS | @ 11:00:00 | Stop: 2021-03-21

## 2021-03-13 MED ADMIN — senna (SENOKOT) tablet 2 tablet: 2 | ORAL | @ 02:00:00

## 2021-03-13 MED ADMIN — famotidine (PEPCID) tablet 20 mg: 20 mg | GASTROENTERAL | @ 02:00:00

## 2021-03-13 MED ADMIN — linezolid in dextrose 5% (ZYVOX) 600 mg/300 mL IVPB 600 mg: 600 mg | INTRAVENOUS | @ 02:00:00 | Stop: 2021-03-19

## 2021-03-13 MED ADMIN — gabapentin (NEURONTIN) oral solution: 100 mg | GASTROENTERAL | @ 11:00:00

## 2021-03-13 MED ADMIN — oxyCODONE (ROXICODONE) immediate release tablet 10 mg: 10 mg | GASTROENTERAL | @ 23:00:00 | Stop: 2021-03-19

## 2021-03-13 MED ADMIN — heparin (porcine) 5,000 unit/mL injection 5,000 Units: 5000 [IU] | SUBCUTANEOUS | @ 11:00:00

## 2021-03-13 MED ADMIN — metoclopramide (REGLAN) injection 10 mg: 10 mg | INTRAVENOUS | @ 03:00:00

## 2021-03-13 MED ADMIN — oxyCODONE (ROXICODONE) immediate release tablet 10 mg: 10 mg | GASTROENTERAL | @ 11:00:00 | Stop: 2021-03-19

## 2021-03-13 MED ADMIN — alteplase (ACTIVASE) injection small catheter clearance 1 mg: 1 mg | INTRAVENOUS | @ 01:00:00 | Stop: 2021-03-12

## 2021-03-13 MED ADMIN — acetaminophen (TYLENOL) tablet 650 mg: 650 mg | ORAL | @ 14:00:00

## 2021-03-13 MED ADMIN — oxyCODONE (ROXICODONE) immediate release tablet 10 mg: 10 mg | GASTROENTERAL | @ 06:00:00 | Stop: 2021-03-19

## 2021-03-13 MED ADMIN — heparin (porcine) 5,000 unit/mL injection 5,000 Units: 5000 [IU] | SUBCUTANEOUS | @ 19:00:00

## 2021-03-13 MED ADMIN — LORazepam (ATIVAN) injection 2 mg: 2 mg | INTRAVENOUS | @ 06:00:00 | Stop: 2021-03-13

## 2021-03-13 MED ADMIN — senna (SENOKOT) tablet 2 tablet: 2 | ORAL | @ 13:00:00

## 2021-03-13 MED ADMIN — chlorhexidine (PERIDEX) 0.12 % solution 15 mL: 15 mL | TOPICAL | @ 14:00:00

## 2021-03-13 MED ADMIN — ipratropium-albuteroL (DUO-NEB) 0.5-2.5 mg/3 mL nebulizer solution 3 mL: 3 mL | RESPIRATORY_TRACT | @ 14:00:00

## 2021-03-13 MED ADMIN — lactulose oral solution (30 mL cup): 20 g | ORAL | @ 06:00:00

## 2021-03-13 MED ADMIN — LORazepam (ATIVAN) injection 2 mg: 2 mg | INTRAVENOUS | @ 19:00:00

## 2021-03-13 MED ADMIN — ipratropium-albuteroL (DUO-NEB) 0.5-2.5 mg/3 mL nebulizer solution 3 mL: 3 mL | RESPIRATORY_TRACT | @ 21:00:00

## 2021-03-13 MED ADMIN — LORazepam (ATIVAN) injection 2 mg: 2 mg | INTRAVENOUS | @ 13:00:00 | Stop: 2021-03-13

## 2021-03-13 MED ADMIN — LORazepam (ATIVAN) injection 2 mg: 2 mg | INTRAVENOUS | @ 02:00:00

## 2021-03-13 MED ADMIN — LORazepam (ATIVAN) injection 2 mg: 2 mg | INTRAVENOUS | @ 09:00:00 | Stop: 2021-03-13

## 2021-03-13 MED ADMIN — carboxymethylcellulose sodium (THERATEARS) 0.25 % ophthalmic solution 2 drop: 2 [drp] | OPHTHALMIC | @ 11:00:00

## 2021-03-13 MED ADMIN — lactulose oral solution (30 mL cup): 20 g | ORAL | @ 11:00:00

## 2021-03-13 MED ADMIN — heparin (porcine) 5,000 unit/mL injection 5,000 Units: 5000 [IU] | SUBCUTANEOUS | @ 03:00:00

## 2021-03-13 MED ADMIN — metoclopramide (REGLAN) injection 10 mg: 10 mg | INTRAVENOUS | @ 11:00:00

## 2021-03-13 MED ADMIN — gabapentin (NEURONTIN) oral solution: 100 mg | GASTROENTERAL | @ 03:00:00

## 2021-03-13 NOTE — Unmapped (Signed)
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 *          Cross cover summary       IVIG given overnight.  Fever persisted.  Episode of emesis at 0600, given Zofran.  Repeat EKG ordered for QTC monitoring given concurrent Reglan.  Noted to have BM upon repositioning.      Juanda Chance, MD

## 2021-03-13 NOTE — Unmapped (Signed)
Respiratory Safety Check    Date: 03/12/2021     PPV Device at bedside:   [x]  Yes  []  Replaced  []  N/A    PPV mask at bedside:  [x]  Yes  []  Replaced  []  N/A    Checked humidifier, inspiratory limb, and expiratory limb:   [x]  Yes  []  Replaced  []  N/A    Backup trachs and supplies available at bedside:  []  Yes  []  Replaced  [x]  N/A    Documented trach airway form visible at bedside:   []  Yes  []  Replaced  [x]  N/A    iNO connected with PPV device:  []  Yes  []  Replaced  [x]  N/A

## 2021-03-13 NOTE — Unmapped (Signed)
Respiratory Safety Check    Date: 03/13/2021     PPV Device at bedside:   [x]  Yes  []  Replaced  []  N/A    PPV mask at bedside:  [x]  Yes  []  Replaced  []  N/A    Checked humidifier, inspiratory limb, and expiratory limb:   [x]  Yes  []  Replaced  []  N/A    Backup trachs and supplies available at bedside:  []  Yes  []  Replaced  [x]  N/A    Documented trach airway form visible at bedside:   []  Yes  []  Replaced  [x]  N/A    iNO connected with PPV device:  []  Yes  []  Replaced  [x]  N/A

## 2021-03-13 NOTE — Unmapped (Signed)
Nephrology Consult Note      Assessment/Recommendations: 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 We have been consulted for evaluation of non-oliguric AKI.     Non-oliguric AKI 2/2 ATN  Baseline cr of 1.6-1.9 in recent months and presented w/ Cr of 3.09 on 12/3 which initially improved to 12/4 after 3L IVF. Subsequently worsening respiratory status from MRSA/influenza and required intubation which led to significant hypotension (vs component of septic shock). Since intubation, creatinine has continued to rise despite 3L of fluid though excellent UOP. Urine sediment with many muddy brown casts and few scattered WBCs, consistent with ATN, likely from hypotension. No obvious nephrotoxic culprits (bactrim stopped, valtrex at ppx dosing) and no WBC casts/frequent WBCs on sediment though AIN remains on the differential (?2/2 TKI). Depending on ongoing urine output, may need dialysis in coming days for volume/electrolyte management but no indication currently. Will cautiously watch UOP and respiratory status over next few days to assess need for renal replacement  - hold further fluids unless newly hypotensive - euvolemic on exam today  - maintain MAP > 65  - could use lasix for symptomatic control if urine output inadequate  - no acute indications for dialysis at this time  - Strict I/O's  - Avoid nephrotoxic drugs including but not limited to NSAIDs, contrasted studies, and fleets enemas   - Dose all meds for creatinine clearance   - Unless absolutely necessary, no MRIs with gadolinium given potential risk for NSF.     Hepatitis status: Hep B surface antigen negative on 01/21/2020    Thank you for this consult.  Nephrology will continue to follow along with the primary team.  Please page the renal fellow on call with questions/concerns.    Patient was seen and evaluated by Dr. Norvel Richards who agrees with this assessment and plan.     Requesting Attending Physician :  [name], MD  Service Requesting Consult : Medical ICU (MDI)    Reason for Consult: AKI    HISTORY OF PRESENT ILLNESS: 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    History obtained from chart and wife at bedside as patient intubated and sedated.   Pt initially presented to outpatient appointment on 12/2 and started on linezolid due to concern for MRSA infection with nausea/vomiting/myalgias. Presented to the ED on 12/3 due to progressive shortness of breath and found to have influenza and MRSA pneumonia from sputum culture. He was treated with tamiflu, linezolid, aztreonam, and azithromycin and his home prophylactic valtrex was continued as was his home bactrim initially. Given poor PO intake he was initially fluid resuscitated with 3L. His respiratory status worsened and he ultimately required intubation on 12/4 and received associated propofol and dilaudid for sedation. Due to difficulties oxygenating, he transiently required 1x vecuronium. His renal function initially improved after fluids and post intubation began to uptrend. He then received 3 additional L between 12/5-12/9 without significant improvement in creatinine though ongoing UOP.    Per wife, has hx of kidney stones. Previous hospitalization in 2021 for severe MRSA infection was also complicated by AHRF and  renal failure requiring dialysis.      Medications:   ??? carboxymethylcellulose sodium  2 drop Both Eyes Q6H [facility]   ??? chlorhexidine  15 mL Topical BID   ??? famotidine  20 mg Enteral tube: gastric  At bedtime   ??? gabapentin  100 mg Enteral tube: gastric  [facility]   ??? heparin (porcine) for subcutaneous use  5,000 Units Subcutaneous Q8H [facility]   ??? flu vacc qs2022-23 6mos up(PF)  0.5 mL Intramuscular During hospitalization   ??? ipratropium-albuteroL  3 mL Nebulization Q6H (RT)   ??? lactulose  20 g Oral Q6H [facility]   ??? linezolid  600 mg Intravenous Q12H [facility]   ??? LORazepam  2 mg Intravenous Q4H   ??? melatonin  3 mg Enteral tube: gastric  QPM   ??? metoclopramide  10 mg Intravenous [facility]   ??? oxyCODONE  10 mg Enteral tube: gastric  Q6H [facility]   ??? senna  2 tablet Oral BID   ??? sulfamethoxazole-trimethoprim  1 tablet Oral 2 times per day on Sun Sat   ??? valACYclovir  500 mg Oral Daily        ALLERGIES  Bupropion hcl, Cefepime, Ceftaroline fosamil, Dapsone, Onion, Vancomycin analogues, Bismuth subsalicylate, and Furosemide    MEDICAL HISTORY  Past Medical History:   Diagnosis Date   ??? 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 Marital status: Married     Spouse name: None   ??? Number of children: None   ??? Years of education: None   ??? Highest education level: None   Tobacco Use   ??? Smoking status: Former     Packs/day: 2.00     Types: Cigarettes     Quit date: 01/20/2020     Years since quitting: 1.1   ??? Smokeless tobacco: Former     Types: Financial planner   ??? Vaping Use: Never used   Other Topics Concern   ??? Do you use sunscreen? No   ??? Tanning bed use? No   ??? Are you easily burned? Yes   ??? Excessive sun exposure? No   ??? Blistering sunburns? Yes     Social Determinants of Health     Financial Resource Strain: Low Risk    ??? Difficulty of Paying Living Expenses: Not very hard   Food Insecurity: No Food Insecurity   ??? Worried About Running Out of Food in the Last Year: Never true   ??? Ran Out of Food in the Last Year: Never true   Transportation Needs: No Transportation Needs   ??? Lack of Transportation (Medical): No   ??? Lack of Transportation (Non-Medical): No        FAMILY HISTORY  Family History   Problem Relation Age of Onset   ??? Melanoma Neg Hx ??? Basal cell carcinoma Neg Hx    ??? Squamous cell carcinoma Neg Hx         Code Status:  Full Code    Review of Systems:  Review of systems was unobtainable due to patient factors.      Physical Exam:  Vitals:    03/12/21 1520   BP:    Pulse: 109   Resp: 28   Temp: (!) 38.4 ??C (101.1 ??F)   SpO2: 95%     I/O this shift:  In: 1270.1 [I.V.:20.1; NG/GT:650; IV  Piggyback:600]  Out: 1100 [Urine:1100]    Intake/Output Summary (Last 24 hours) at 03/12/2021 1618  Last data filed at 03/12/2021 1400  Gross per 24 hour   Intake 1629.74 ml   Output 1760 ml   Net -130.26 ml       CONSTITUTIONAL: intubated, sedated, no distress  HEENT: NG tube in place,   EYES: does not spontaneously open eyes.  CARDIOVASCULAR: Regular, normal S1/S2 heart sounds, no murmurs, no rubs.   PULM: Clear to auscultation bilaterally with ventilator sounds  GASTROINTESTINAL: Soft, active bowel sounds, nontender  EXTREMITIES: No lower extremity edema bilaterally.   SKIN: scab on Left chest covered in foam dressing.  NEUROLOGIC: unable to participate in exam as sedated    LABS    Creatinine Trend  Lab Results   Component Value Date    Creatinine 3.46 (H) 03/12/2021    Creatinine 3.85 (H) 03/12/2021    Creatinine 3.72 (H) 03/11/2021    Creatinine 3.40 (H) 03/11/2021    Creatinine 2.88 (H) 03/10/2021    Creatinine 2.79 (H) 03/10/2021    Creatinine 2.45 (H) 03/09/2021    Creatinine 2.64 (H) 03/09/2021    Creatinine 2.69 (H) 03/08/2021    Creatinine 2.44 (H) 03/08/2021    Creatinine 2.21 (H) 03/07/2021    Creatinine 2.50 (H) 03/07/2021    Creatinine 2.52 (H) 03/06/2021    Creatinine 3.09 (H) 03/06/2021    Creatinine 1.58 (H) 02/11/2021    Creatinine 1.94 (H) 01/13/2021    Creatinine 1.98 (H) 01/04/2021    Creatinine 1.95 (H) 12/28/2020    Creatinine 1.74 (H) 12/21/2020    Creatinine 1.51 (H) 12/14/2020        Lab Results   Component Value Date    Color, UA Light Orange 03/06/2021    Specific Gravity, UA 1.022 03/06/2021    pH, UA 5.5 03/06/2021    Protein, UA 100 mg/dL (A) 16/01/9603    Glucose, UA Negative 03/06/2021    Ketones, UA Trace (A) 03/06/2021    Blood, UA Trace (A) 03/06/2021    Nitrite, UA Negative 03/06/2021    Leukocyte Esterase, UA Small (A) 03/06/2021    Urobilinogen, UA 2.0 mg/dL (A) 54/12/8117    Bilirubin, UA Negative 03/06/2021       Lab Results   Component Value Date    Sodium, Ur <10 03/11/2021    Urea Nitrogen, Ur 637 03/11/2021    Creat U 131.2 03/11/2021    Protein/Creatinine Ratio, Urine 1.279 02/20/2020        Lab Results   Component Value Date    Sodium, Ur <10 03/11/2021    Sodium, Ur 10 11/02/2020    Sodium, Ur 53 02/09/2020        Lab Results   Component Value Date    Sodium 139 03/12/2021    Sodium Whole Blood 140 03/12/2021    Sodium Whole Blood 137 03/11/2021    Potassium 3.9 03/12/2021    Potassium, Bld 3.8 03/12/2021    Potassium, Bld 4.0 03/11/2021    Chloride 107 03/12/2021    CO2 21.0 03/12/2021    BUN 64 (H) 03/12/2021    Creatinine 3.46 (H) 03/12/2021     Lab Results   Component Value Date    Calcium 8.3 (L) 03/12/2021    Magnesium 2.2 03/12/2021    Phosphorus 4.7 03/12/2021    Albumin 1.4 (L) 03/12/2021        Lab Results   Component Value Date    WBC 16.5 (H) 03/12/2021  HGB 8.9 (L) 03/12/2021    Hemoglobin 9.3 (L) 03/11/2021    HCT 26.9 (L) 03/12/2021    Platelet 268 03/12/2021        IMAGING STUDIES:  ECG 12 Lead    Result Date: 03/07/2021  SINUS TACHYCARDIA RIGHTWARD AXIS BORDERLINE ECG WHEN COMPARED WITH ECG OF 01-Nov-2020 05:16, NO SIGNIFICANT CHANGE WAS FOUND Confirmed by Mariane Baumgarten ([id number]) on 03/07/2021 7:30:02 AM    XR Chest Portable    Result Date: 03/10/2021  EXAM: XR CHEST PORTABLE DATE: 03/10/2021 1:26 PM ACCESSION: [id number] UN DICTATED: 03/10/2021 1:28 PM INTERPRETATION LOCATION: [facility]     CLINICAL INDICATION: 40 years old Male with ETT (VENTILATOR/RESPIRATOR DEP STATUS)      COMPARISON: Prior day portable chest radiograph     TECHNIQUE: Portable Chest Radiograph.     FINDINGS:     Unchanged support devices. Similar patchy and confluent consolidation in the right lung, greatest in the mid zone. Persistent left lower lung consolidation.     Small right pleural effusion. No pneumothorax.     Stable cardiomediastinal silhouette.                 No significant interval change with persistent right lung airspace disease, densest in the mid zone, and left lower lung atelectasis/airspace disease.    XR Chest Portable    Result Date: 03/08/2021  EXAM: XR CHEST PORTABLE DATE: 03/07/2021 10:38 PM ACCESSION: [id number] UN DICTATED: 03/08/2021 8:22 AM INTERPRETATION LOCATION: [facility]     CLINICAL INDICATION: 40 years old Male with ETT (VENTILATOR/RESPIRATOR DEP STATUS)      COMPARISON: 03/07/2021     TECHNIQUE: Single frontal view of the chest.     FINDINGS: Placement of endotracheal tube with tip in 4.3 cm above carina. NG/OG tube courses below left hemidiaphragm with tip out of field of view. Similar air space consolidation in the right upper-mid lung with volume loss. Stable small left pleural effusion and basilar atelectasis. Normal cardiac silhouette unchanged. No pneumothorax.             Stable airspace disease in the right upper-mid lung.             XR Chest Portable    Result Date: 03/08/2021  EXAM: XR CHEST PORTABLE DATE: 03/08/2021 2:03 AM ACCESSION: [id number] UN DICTATED: 03/08/2021 2:48 AM INTERPRETATION LOCATION: [facility]     CLINICAL INDICATION: 40 years old Male with CATHETER  NON VASCULAR FIT&ADJ      COMPARISON: Chest radiograph 03/07/2021     TECHNIQUE: Portable Chest Radiograph.     FINDINGS:     Right IJ approach central venous catheter overlies the distal SVC. Remaining lines and tubes are unchanged.     Interval increase in right hemithorax consolidation. Persistent retrocardiac consolidation.     No pleural effusion or pneumothorax.     Stable cardiomediastinal silhouette.             -Support devices above.     -Interval worsening of right hemithorax aeration.    XR Chest Portable    Result Date: 03/07/2021  EXAM: XR CHEST PORTABLE DATE: 03/07/2021 5:19 AM ACCESSION: [id number] UN DICTATED: 03/07/2021 9:48 AM INTERPRETATION LOCATION:  campus     CLINICAL INDICATION: 40 years old Male with PNEUMONIA      COMPARISON: Single view of the chest 03/06/2021     TECHNIQUE:  Single AP portable view of the chest     FINDINGS:     No significant change in appearance of the cardiomediastinal silhouette.  No appreciable pneumothorax.     Probable trace right pleural effusion. Patchy and confluent heterogeneous right upper lung opacities are similar to prior exam. Additional scattered opacities at the lung bases, slightly increased on the left from prior.         Slight increase in nonspecific left basilar opacities. Patchy and confluent heterogeneous opacities in the right upper lung are similar.      XR Chest Portable    Result Date: 03/06/2021  EXAM: XR CHEST PORTABLE DATE: 03/06/2021 10:06 AM ACCESSION: [id number] UN DICTATED: 03/06/2021 10:23 AM INTERPRETATION LOCATION: [facility]     CLINICAL INDICATION: 39 years old Male with PNEUMONIA      COMPARISON: 03/05/2021     TECHNIQUE: Portable Chest Radiograph.     FINDINGS:     Marked interval worsening of airspace consolidation in the right upper and midlung zones. The left lung is clear.     No pleural effusion or pneumothorax.     Stable cardiomediastinal silhouette.                 1. Marked interval worsening of right lung consolidation.    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     FINDINGS:     Unchanged lines and tubes.     Interval improvement in heterogeneous airspace opacities bilaterally with residual patchy consolidation in right upper lobe and midlung.      Trace right pleural effusion. No pneumothorax.     Stable cardiomediastinal silhouette.                 Interval improvement in previously reported bilateral heterogeneous airspace opacities with residual patchy consolidation in right upper lobe and midlung.     Trace right pleural effusion.    ED POCUS Chest Bilateral    Result Date: 03/06/2021  Limited Cardiac Ultrasound (CPT [phone])     Indication: A focused ultrasound exam of the heart was performed to evaluate for pericardial effusion, tamponade, severe hypovolemia, or gross abnormalities of cardiac anatomy or function in this patient. The ultrasound was performed with the following indications, as noted in the H&P: Hypotension     Identified structures: The pericardial sac, myocardium, and 4 chambers were identified using the following views: subxiphoid, parasternal long axis, parasternal short axis, apical 4-chamber, and IVC (long axis)     Findings: Exam of the above structures revealed the following findings:      Pericardial effusion: Absent   Pericardial tamponade: N/A  Global LV function: Hyperdynamic  Right ventricular size: Normal   Signs of RV strain: N/A  IVC: Collapsed:  >50%      Other findings: None.     Limitations: None.     Impression:     No sonographic evidence of significant cardiac dysfunction, No sonographic evidence of significant pericardial effusion, Normal RV, and Sonographic findings suggestive of volume depletion     Other: None.     Interpreted by: Ardeth Perfect, MD     Quality Assurance  After review of the point-of-care ultrasound performed in this case I assess the overall image quality as: Image quality: Minimal criteria met for diagnosis, all structures imaged well and diagnosis  easily supported  The accuracy of interpretation of images as presented reflects a true positive.     This study does  meet minimum criteria for credentialing and billing.     Luther Redo, MD             XR Abdomen 1 View    Result Date: 03/08/2021  EXAM: XR ABDOMEN 1 VIEW DATE: 03/07/2021 10:38 PM ACCESSION: [id number] UN DICTATED: 03/08/2021 8:22 AM INTERPRETATION LOCATION: [facility]     CLINICAL INDICATION: 40 years old Male with NGT (CATHETER VASCULAR FIT & ADJ)      COMPARISON: Concurrent chest radiograph and 11/01/2020 abdominal radiograph.     TECHNIQUE: Semiupright view of the abdomen.     FINDINGS: Nonweighted esophagogastric tube with tip and side port overlying the left upper quadrant. Nonobstructive bowel gas pattern. Osseous structures and soft tissues unremarkable.         Esophagogastric tube with tip and side port overlying the gastric body.

## 2021-03-13 NOTE — Unmapped (Signed)
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 *    Interval history: Adam [name] with PMHx of ALL, COPD, prior MRSA bacteremia requiring intubation, MDD, lower back pain, tobacco use hx, presenting with??nausea, vomiting, fever initially found to be hypotensive and tachycardic at oncologists office, started on linezolid.??SOB worsened??in subsequent 24hrs??prompting presentation to the ED.??Admitted to the MICU due to c/f??acute hypoxic respiratory failure secondary to influenza infection with superimposed PNA.??    24 hr inter: Patient continues to fever. Patient FiO2 requirement continues to decrease to 50%. Patient now on PRVC vent mode.Patient made BM overnight. Patient had 2 episodes of emesis. Started Zofran and continued bowel regimen.    Neurological   Sedation   Intubated secondary to worsening hypoxia, s/p vecuronium x1. Continuing to wean sedation.   - Continue PRN dilaudid 2q 2hrs  - Decreased scheduled Ativan 2 q6hrs  - Reduce schedule oxycodone to 10 q6hrs  - Continue gabapentin 100 q8hrs (dose reduced for renal function)    Chronic??Pain??- Peripheral neuropathy  Has chronic pain at baseline from??ALL. Follows with Adam Keith.   - See above    MDD  Holding home clonazapam and nortriptyline   ??  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 Stable.     Pulmonary   AHRF 2/2 Influenza w/ superimposed MRSA PNA - s/p Intubation  S/p intubation 12/5 overnight. Patient previously proned, now transferred to supine. Patient FiO2 continues to decrease to 50% from 70% overnight. P/F ratio less than 150 currently, will assess at 16 hours. c/f ARDS given acute onset, bilateral infiltrates, low P/F continues to increase, no pulm edema. Patient decreased to 50% in middle of the day. Will continue to ween O2 as tolerated   -PRVC  - Continue Abx as indicated below   - Droplet Precautions??  - Follow serial ABGs to ensure appropriately compensating for acidosis  - Consider CT chest if acute worsening of respiratory status, but otherwise imaging would not change current management.  ??  COPD  20 ppy tobacco use hx, followed by Pulmonology outpt. Per 01/25/21 no prior exacerbations. Utilizes home albuterol.   - O2 Goal 88-92%  - cont ANORO Ellipta  - Holding on steroids  - ABx as listed below  - prn duonebs  ??  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 Underwent unsuccessful thoracentesis, at the time referred to CT surgery, at the time didn't think warranted surgical intervention. Futility of POC bedside US of lungs discussed, may offer little change to clinical evaluation. Can consider further imaging if acute worsening of respiratory status.  - POCUS today    Cardiovascular   Sinus tachycardia??2/2 Sepsis:  - continue  to wean norepi     Renal   AKI  Likely prerenal vs ATN. Has not resolved with increasing amounts of fluids so ATN more likely. Stable UOP.  - Consult nephrology, appreciate recommendations   - 1L LR and increase FWF to 100 q4hrs    Infectious Disease/Autoimmune   Influenza c/b MRSA PNA - Hx of MRSA Bacteremia  Leukocytosis with neutrophil predominance, febrile, myalgias. CXR reveals??alveolar process covering the entire R side, with signifcant progression from 12/2 CXR. Empirically given Linezolid/Aztreonam in the ED. 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 See infectious disease note by Dr. Cardell Keith from 03/06/21 for list of prior infections & notable antimicrobial intolerance/allergy list. MRSA screen positive, though BCx remain negative. Adam Keith from 12/4 is positive for MRSA. S/p Azithromycin 12/3-12/5, stopped given negative urine legionella.   - Infectious disease Immunocomp consulted, follow reccs.  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  History of DRESS syndrome??2/2 cephalosporin  - consulted ICID for reccs re: abx regimen  - Avoid beta lactams    Cultures:  Blood Culture, Routine (no units)   Date Value   03/09/2021 No Growth at 4 days   03/09/2021 No Growth at 4 days     Urine Culture, Comprehensive (no units)   Date Value   03/06/2021 NO GROWTH     Lower Respiratory Culture (no units)   Date Value   03/07/2021 2+ Methicillin resistant Staphylococcus aureus (A)     WBC (10*9/L)   Date Value   03/13/2021 17.1 (H)     WBC, UA (/HPF)   Date Value   03/06/2021 17 (H)          FEN/GI   Constipation-Possible residuals from tube feeds  Improved today. Having multiple BMs.  -Lactulose 20g q6h  -Reglan 10 mg IV TID  -Start senna 2 tablets BID  -Daily dulcolax until regular BM are made.     Emesis  Patient had 2 episodes of emesis today, most leilly due to constipation  Continue bowel regimen above  -Zofran 8 mg prn q8h    Malnutrition Assessment: Not done yet.  Body mass index is 32.06 kg/m??.  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 ??He subsequently had difficulty tolerating single agent dasatinib. S/p 4 cycles of ponatinib-blinatumomab.??He is now MRD-negative and on ponatinib monotherapy  - heme on board: Recc holding home TKI until malignant hematology evals for ready to resume. Patient's family will need update to bring in home supply then.    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 Discussed with heme will obtain repeat IgG next week.  DVT PPx  - subcutaneous heparin 5000 units q 8hr    Endocrine   NAI    Integumentary   Skin Rash over anterior abdomen   Pictures in media tab. Suspect due to injury during proning.  - Continue to monitor  - Wound Keith consult    - WOCN consulted for high risk skin assessment Yes.  - cont pressure mitigating precautions per skin policy    Prophylaxis/LDA/Restraints/Consults   Can CVC be removed? N/A, no CVC present (including vascular catheter for HD or PLEX)   Can A-line be removed? No: frequent ABGs  Can Foley be removed? No: Need continuous I/O  Mobility plan: Step 1 - Range of motion    Feeding: Tube feeds at goal  Analgesia: Pain adequately controlled, weaning  Sedation SAT/SBT: N/A  Thromboembolic ppx: SQ heparin  Head of bed >30 degrees: Yes  Ulcer ppx: Yes, mechanical ventilation > 3 days, not on tube feeds  Glucose within target range: Yes, in range    Does patient need/have an active type/screen? No    RASS at goal? Yes  [place] Agitation Assessment Scale (RASS) : -2 (03/13/2021 12:00 PM)     Can antipsychotics be stopped? N/A, not on antipsychotics  CAM-ICU Result: Negative (11/09/2020  9:00 PM)      Would hospice Keith be appropriate for this patient? No, patient improving or expected to improve  Any unaddressed hospice/palliative Keith needs? no    Patient Lines/Drains/Airways Status     Active Active Lines, Drains, & Airways     Name Placement date Placement time Site Days    ETT  7.5 03/07/21  2131  -- 5    CVC Triple Lumen 03/08/21 Non-tunneled Right Internal jugular 03/08/21  0132  Internal jugular  5    NG/OG Tube Feedings Center mouth 03/08/21  0106  Center mouth  5    Urethral Catheter 03/07/21  2200  --  5    Peripheral IV 03/06/21 Left Arm 03/06/21  --  Arm  7    Peripheral IV 03/08/21 Right Antecubital 03/08/21  0102  Antecubital  5    Arterial Line 03/07/21 Right Radial 03/07/21  1500  Radial  5              Patient Lines/Drains/Airways Status     Active Wounds     Name Placement date Placement time Site Days    Wound 03/09/21 Pressure Injury Nose Mid Stage 2 03/09/21  1005  Nose  4    Wound 03/09/21 Skin Tear Chest Left 03/09/21  1030  Chest  4                Goals of Keith     Code Status: Full Code    Designated Healthcare Decision Maker:  Adam Keith current decisional capacity for healthcare decision-making is Full capacity. His designated Educational psychologist) is/are   HCDM (patient stated preference): Adam Keith, Adam Keith - Spouse - [phone].      Subjective     See 24 hr int    Objective     Vitals - past 24 hours  Temp:  [38.4 ??C (101.1 ??F)-39.9 ??C (103.8 ??F)] 38.4 ??C (101.1 ??F)  Heart Rate:  [103-125] 104  SpO2 Pulse:  [103-125] 104  Resp:  [23-32] 28  FiO2 (%):  [50 %-60 %] 50 %  SpO2:  [95 %-98 %] 96 % Intake/Output  I/O last 3 completed shifts:  In: 2907.5 [I.V.:57.5; NG/GT:1950; IV Piggyback:900]  Out: 3960 [Urine:3960]  Physical Exam:    General:??Appears comfortable, sedated in NAD   HEENT:??NCAT, reports no sinus tenderness. Moist mucus membranes.  CV:??sinus tachycardia, no m/r/g. No clubbing  Pulm:??Ventilator breath sounds, 45% FiO2  GI:??Abdomen distended, soft, non-tender. Bowel sounds present.  MSK:??No rigidity. Diffuse myalgia. No LE swelling/size discrepancy.  Skin:??Unchanged Abrasion to left anterior chest wall, no crepitus, linear erythema on anterior abdomen  Neuro:??Sedated, not able to follow commands or track.      Continuous Infusions:   ??? sodium chloride         Scheduled Medications:   ??? alteplase  1 mg Intravenous Once   ??? carboxymethylcellulose sodium  2 drop Both Eyes Q6H SCH   ??? chlorhexidine  15 mL Topical BID   ??? famotidine  20 mg Enteral tube: gastric  At bedtime   ??? gabapentin  100 mg Enteral tube: gastric  [facility]   ??? heparin (porcine) for subcutaneous use  5,000 Units Subcutaneous Q8H [facility]   ??? flu vacc qs2022-23 6mos up(PF)  0.5 mL Intramuscular During hospitalization   ??? ipratropium-albuteroL  3 mL Nebulization Q6H (RT)   ??? lactulose  20 g Oral Q6H SCH   ??? linezolid  600 mg Intravenous Q12H [facility]   ??? LORazepam  2 mg Intravenous Q6H   ??? melatonin  3 mg Enteral tube: gastric  QPM   ??? metoclopramide  10 mg Intravenous [facility]   ??? oxyCODONE  10 mg Enteral tube: gastric  Q6H SCH   ??? senna  2 tablet Oral BID   ??? sulfamethoxazole-trimethoprim  1 tablet Oral 2 times per day on Sun Sat   ??? valACYclovir  500 mg Oral Daily       PRN medications:  acetaminophen, acetaminophen **AND** diphenhydrAMINE, albuterol, alteplase **AND** alteplase, [COMPLETED] alteplase **AND** alteplase, bisacodyL, Implement **AND** Keith order/instruction **AND** Vital signs **AND** sodium chloride **AND** sodium chloride 0.9% **AND** diphenhydrAMINE **AND** famotidine **AND** meperidine **AND** methylPREDNISolone sodium succinate (PF) **AND** EPINEPHrine, haloperidol lactate, HYDROmorphone, ondansetron    Data/Imaging Review: Reviewed in Epic and personally interpreted on 03/13/2021. See EMR for detailed results.

## 2021-03-14 LAB — BLOOD GAS CRITICAL CARE PANEL, ARTERIAL
BASE EXCESS ARTERIAL: -1.2 (ref -2.0–2.0)
CALCIUM IONIZED ARTERIAL (MG/DL): 4.69 mg/dL (ref 4.40–5.40)
FIO2 ARTERIAL: 50
GLUCOSE WHOLE BLOOD: 107 mg/dL (ref 70–179)
HCO3 ARTERIAL: 23 mmol/L (ref 22–27)
HEMOGLOBIN BLOOD GAS: 10 g/dL — ABNORMAL LOW
LACTATE BLOOD ARTERIAL: 0.8 mmol/L (ref <1.3)
O2 SATURATION ARTERIAL: 93.8 % — ABNORMAL LOW (ref 94.0–100.0)
PCO2 ARTERIAL: 34.7 mmHg — ABNORMAL LOW (ref 35.0–45.0)
PH ARTERIAL: 7.43 (ref 7.35–7.45)
PO2 ARTERIAL: 69.9 mmHg — ABNORMAL LOW (ref 80.0–110.0)
POTASSIUM WHOLE BLOOD: 3.2 mmol/L — ABNORMAL LOW (ref 3.4–4.6)
SODIUM WHOLE BLOOD: 144 mmol/L (ref 135–145)

## 2021-03-14 LAB — CBC W/ AUTO DIFF
BASOPHILS ABSOLUTE COUNT: 0.2 10*9/L — ABNORMAL HIGH (ref 0.0–0.1)
BASOPHILS RELATIVE PERCENT: 1.1 %
EOSINOPHILS ABSOLUTE COUNT: 0.1 10*9/L (ref 0.0–0.5)
EOSINOPHILS RELATIVE PERCENT: 0.4 %
HEMATOCRIT: 28.9 % — ABNORMAL LOW (ref 39.0–48.0)
HEMOGLOBIN: 9.4 g/dL — ABNORMAL LOW (ref 12.9–16.5)
LYMPHOCYTES ABSOLUTE COUNT: 0.9 10*9/L — ABNORMAL LOW (ref 1.1–3.6)
LYMPHOCYTES RELATIVE PERCENT: 4 %
MEAN CORPUSCULAR HEMOGLOBIN CONC: 32.5 g/dL (ref 32.0–36.0)
MEAN CORPUSCULAR HEMOGLOBIN: 27.8 pg (ref 25.9–32.4)
MEAN CORPUSCULAR VOLUME: 85.7 fL (ref 77.6–95.7)
MEAN PLATELET VOLUME: 8.1 fL (ref 6.8–10.7)
MONOCYTES ABSOLUTE COUNT: 1 10*9/L — ABNORMAL HIGH (ref 0.3–0.8)
MONOCYTES RELATIVE PERCENT: 4.3 %
NEUTROPHILS ABSOLUTE COUNT: 19.9 10*9/L — ABNORMAL HIGH (ref 1.8–7.8)
NEUTROPHILS RELATIVE PERCENT: 90.2 %
PLATELET COUNT: 262 10*9/L (ref 150–450)
RED BLOOD CELL COUNT: 3.37 10*12/L — ABNORMAL LOW (ref 4.26–5.60)
RED CELL DISTRIBUTION WIDTH: 17.4 % — ABNORMAL HIGH (ref 12.2–15.2)
WBC ADJUSTED: 22.1 10*9/L — ABNORMAL HIGH (ref 3.6–11.2)

## 2021-03-14 LAB — BASIC METABOLIC PANEL
ANION GAP: 13 mmol/L (ref 5–14)
ANION GAP: 12 mmol/L (ref 5–14)
BLOOD UREA NITROGEN: 55 mg/dL — ABNORMAL HIGH (ref 9–23)
BLOOD UREA NITROGEN: 53 mg/dL — ABNORMAL HIGH (ref 9–23)
BUN / CREAT RATIO: 23
BUN / CREAT RATIO: 25
CALCIUM: 8.8 mg/dL (ref 8.7–10.4)
CALCIUM: 8.5 mg/dL — ABNORMAL LOW (ref 8.7–10.4)
CHLORIDE: 106 mmol/L (ref 98–107)
CHLORIDE: 110 mmol/L — ABNORMAL HIGH (ref 98–107)
CO2: 22 mmol/L (ref 20.0–31.0)
CO2: 23 mmol/L (ref 20.0–31.0)
CREATININE: 2.36 mg/dL — ABNORMAL HIGH
CREATININE: 2.14 mg/dL — ABNORMAL HIGH
EGFR CKD-EPI (2021) MALE: 35 mL/min/{1.73_m2} — ABNORMAL LOW (ref >=60)
EGFR CKD-EPI (2021) MALE: 39 mL/min/{1.73_m2} — ABNORMAL LOW (ref >=60)
GLUCOSE RANDOM: 108 mg/dL (ref 70–179)
GLUCOSE RANDOM: 98 mg/dL (ref 70–179)
POTASSIUM: 3.8 mmol/L (ref 3.4–4.8)
POTASSIUM: 3.8 mmol/L (ref 3.4–4.8)
SODIUM: 141 mmol/L (ref 135–145)
SODIUM: 145 mmol/L (ref 135–145)

## 2021-03-14 LAB — BLOOD GAS, ARTERIAL
BASE EXCESS ARTERIAL: -1.3 (ref -2.0–2.0)
BASE EXCESS ARTERIAL: -2.2 — ABNORMAL LOW (ref -2.0–2.0)
BASE EXCESS ARTERIAL: -0.9 (ref -2.0–2.0)
BASE EXCESS ARTERIAL: -2.4 — ABNORMAL LOW (ref -2.0–2.0)
HCO3 ARTERIAL: 23 mmol/L (ref 22–27)
HCO3 ARTERIAL: 22 mmol/L (ref 22–27)
HCO3 ARTERIAL: 23 mmol/L (ref 22–27)
HCO3 ARTERIAL: 21 mmol/L — ABNORMAL LOW (ref 22–27)
O2 SATURATION ARTERIAL: 99.6 % (ref 94.0–100.0)
O2 SATURATION ARTERIAL: 93.7 % — ABNORMAL LOW (ref 94.0–100.0)
O2 SATURATION ARTERIAL: 87.8 % — ABNORMAL LOW (ref 94.0–100.0)
O2 SATURATION ARTERIAL: 92.5 % — ABNORMAL LOW (ref 94.0–100.0)
PCO2 ARTERIAL: 37.5 mmHg (ref 35.0–45.0)
PCO2 ARTERIAL: 35.2 mmHg (ref 35.0–45.0)
PCO2 ARTERIAL: 36.3 mmHg (ref 35.0–45.0)
PCO2 ARTERIAL: 29.5 mmHg — ABNORMAL LOW (ref 35.0–45.0)
PH ARTERIAL: 7.4 (ref 7.35–7.45)
PH ARTERIAL: 7.41 (ref 7.35–7.45)
PH ARTERIAL: 7.42 (ref 7.35–7.45)
PH ARTERIAL: 7.46 — ABNORMAL HIGH (ref 7.35–7.45)
PO2 ARTERIAL: 142 mmHg — ABNORMAL HIGH (ref 80.0–110.0)
PO2 ARTERIAL: 68.1 mmHg — ABNORMAL LOW (ref 80.0–110.0)
PO2 ARTERIAL: 54.7 mmHg — ABNORMAL LOW (ref 80.0–110.0)
PO2 ARTERIAL: 61.7 mmHg — ABNORMAL LOW (ref 80.0–110.0)

## 2021-03-14 LAB — MAGNESIUM
MAGNESIUM: 2 mg/dL (ref 1.6–2.6)
MAGNESIUM: 1.8 mg/dL (ref 1.6–2.6)

## 2021-03-14 LAB — HEPATIC FUNCTION PANEL
ALBUMIN: 1.9 g/dL — ABNORMAL LOW (ref 3.4–5.0)
ALKALINE PHOSPHATASE: 198 U/L — ABNORMAL HIGH (ref 46–116)
ALT (SGPT): 19 U/L (ref 10–49)
AST (SGOT): 37 U/L — ABNORMAL HIGH (ref <=34)
BILIRUBIN DIRECT: 0.7 mg/dL — ABNORMAL HIGH (ref 0.00–0.30)
BILIRUBIN TOTAL: 0.9 mg/dL (ref 0.3–1.2)
PROTEIN TOTAL: 6.5 g/dL (ref 5.7–8.2)

## 2021-03-14 LAB — URINALYSIS
BACTERIA: NONE SEEN /HPF
BILIRUBIN UA: NEGATIVE
GLUCOSE UA: NEGATIVE
KETONES UA: NEGATIVE
LEUKOCYTE ESTERASE UA: NEGATIVE
NITRITE UA: NEGATIVE
PH UA: 5.5 (ref 5.0–9.0)
PROTEIN UA: 30 — AB
RBC UA: 2 /HPF (ref <=3)
SPECIFIC GRAVITY UA: 1.011 (ref 1.003–1.030)
SQUAMOUS EPITHELIAL: <1 /HPF (ref 0–5)
UROBILINOGEN UA: <2
WBC UA: 4 /HPF — ABNORMAL HIGH (ref <=2)

## 2021-03-14 LAB — PHOSPHORUS: PHOSPHORUS: 3.7 mg/dL (ref 2.4–5.1)

## 2021-03-14 MED ADMIN — oxyCODONE (ROXICODONE) immediate release tablet 10 mg: 10 mg | GASTROENTERAL | @ 18:00:00 | Stop: 2021-03-24

## 2021-03-14 MED ADMIN — linezolid in dextrose 5% (ZYVOX) 600 mg/300 mL IVPB 600 mg: 600 mg | INTRAVENOUS | @ 14:00:00 | Stop: 2021-03-19

## 2021-03-14 MED ADMIN — gabapentin (NEURONTIN) oral solution: 100 mg | GASTROENTERAL | @ 10:00:00

## 2021-03-14 MED ADMIN — LORazepam (ATIVAN) injection 2 mg: 2 mg | INTRAVENOUS | @ 18:00:00

## 2021-03-14 MED ADMIN — oxyCODONE (ROXICODONE) immediate release tablet 10 mg: 10 mg | GASTROENTERAL | @ 05:00:00 | Stop: 2021-03-14

## 2021-03-14 MED ADMIN — ondansetron (ZOFRAN) injection 8 mg: 8 mg | INTRAVENOUS | @ 22:00:00

## 2021-03-14 MED ADMIN — valACYclovir (VALTREX) tablet 500 mg: 500 mg | ORAL | @ 14:00:00

## 2021-03-14 MED ADMIN — carboxymethylcellulose sodium (THERATEARS) 0.25 % ophthalmic solution 2 drop: 2 [drp] | OPHTHALMIC | @ 22:00:00

## 2021-03-14 MED ADMIN — melatonin tablet 3 mg: 3 mg | GASTROENTERAL | @ 22:00:00

## 2021-03-14 MED ADMIN — HYDROmorphone (PF) (DILAUDID) injection 2 mg: 2 mg | INTRAVENOUS | @ 03:00:00 | Stop: 2021-03-21

## 2021-03-14 MED ADMIN — heparin (porcine) 5,000 unit/mL injection 5,000 Units: 5000 [IU] | SUBCUTANEOUS | @ 03:00:00

## 2021-03-14 MED ADMIN — acetaminophen (TYLENOL) tablet 650 mg: 650 mg | ORAL | @ 07:00:00

## 2021-03-14 MED ADMIN — HYDROmorphone (PF) (DILAUDID) injection 2 mg: 2 mg | INTRAVENOUS | @ 05:00:00 | Stop: 2021-03-21

## 2021-03-14 MED ADMIN — furosemide (LASIX) injection 80 mg: 80 mg | INTRAVENOUS | @ 22:00:00 | Stop: 2021-03-14

## 2021-03-14 MED ADMIN — metoclopramide (REGLAN) injection 10 mg: 10 mg | INTRAVENOUS | @ 03:00:00

## 2021-03-14 MED ADMIN — oxyCODONE (ROXICODONE) immediate release tablet 10 mg: 10 mg | GASTROENTERAL | @ 10:00:00 | Stop: 2021-03-14

## 2021-03-14 MED ADMIN — chlorhexidine (PERIDEX) 0.12 % solution 15 mL: 15 mL | TOPICAL | @ 12:00:00

## 2021-03-14 MED ADMIN — LORazepam (ATIVAN) injection 2 mg: 2 mg | INTRAVENOUS | @ 14:00:00

## 2021-03-14 MED ADMIN — gabapentin (NEURONTIN) oral solution: 100 mg | GASTROENTERAL | @ 03:00:00

## 2021-03-14 MED ADMIN — LORazepam (ATIVAN) injection 2 mg: 2 mg | INTRAVENOUS | @ 07:00:00

## 2021-03-14 MED ADMIN — dexmedetomidine 400 mcg in sodium chloride 0.9% 100 ml (4 mcg/mL) infusion PMB: INTRAVENOUS | @ 07:00:00 | Stop: 2021-03-14

## 2021-03-14 MED ADMIN — sulfamethoxazole-trimethoprim (BACTRIM DS) 800-160 mg tablet 160 mg of trimethoprim: 1 | ORAL | @ 01:00:00 | Stop: 2021-03-28

## 2021-03-14 MED ADMIN — ipratropium-albuteroL (DUO-NEB) 0.5-2.5 mg/3 mL nebulizer solution 3 mL: 3 mL | RESPIRATORY_TRACT | @ 14:00:00

## 2021-03-14 MED ADMIN — oxyCODONE (ROXICODONE) immediate release tablet 10 mg: 10 mg | GASTROENTERAL | @ 22:00:00 | Stop: 2021-03-24

## 2021-03-14 MED ADMIN — carboxymethylcellulose sodium (THERATEARS) 0.25 % ophthalmic solution 2 drop: 2 [drp] | OPHTHALMIC | @ 10:00:00

## 2021-03-14 MED ADMIN — ipratropium-albuteroL (DUO-NEB) 0.5-2.5 mg/3 mL nebulizer solution 3 mL: 3 mL | RESPIRATORY_TRACT | @ 09:00:00

## 2021-03-14 MED ADMIN — famotidine (PEPCID) tablet 20 mg: 20 mg | GASTROENTERAL | @ 01:00:00

## 2021-03-14 MED ADMIN — lactulose oral solution (30 mL cup): 20 g | ORAL | @ 05:00:00 | Stop: 2021-03-14

## 2021-03-14 MED ADMIN — ipratropium-albuteroL (DUO-NEB) 0.5-2.5 mg/3 mL nebulizer solution 3 mL: 3 mL | RESPIRATORY_TRACT | @ 21:00:00

## 2021-03-14 MED ADMIN — senna (SENOKOT) tablet 2 tablet: 2 | ORAL | @ 01:00:00

## 2021-03-14 MED ADMIN — metoclopramide (REGLAN) injection 10 mg: 10 mg | INTRAVENOUS | @ 10:00:00 | Stop: 2021-03-14

## 2021-03-14 MED ADMIN — HYDROmorphone (PF) (DILAUDID) injection 2 mg: 2 mg | INTRAVENOUS | @ 21:00:00 | Stop: 2021-03-21

## 2021-03-14 MED ADMIN — sulfamethoxazole-trimethoprim (BACTRIM DS) 800-160 mg tablet 160 mg of trimethoprim: 1 | ORAL | @ 14:00:00 | Stop: 2021-03-28

## 2021-03-14 MED ADMIN — LORazepam (ATIVAN) injection 2 mg: 2 mg | INTRAVENOUS | @ 01:00:00

## 2021-03-14 MED ADMIN — senna (SENOKOT) tablet 2 tablet: 2 | ORAL | @ 14:00:00

## 2021-03-14 MED ADMIN — HYDROmorphone (PF) (DILAUDID) injection 2 mg: 2 mg | INTRAVENOUS | @ 18:00:00 | Stop: 2021-03-21

## 2021-03-14 MED ADMIN — ipratropium-albuteroL (DUO-NEB) 0.5-2.5 mg/3 mL nebulizer solution 3 mL: 3 mL | RESPIRATORY_TRACT | @ 02:00:00

## 2021-03-14 MED ADMIN — heparin (porcine) 5,000 unit/mL injection 5,000 Units: 5000 [IU] | SUBCUTANEOUS | @ 18:00:00

## 2021-03-14 MED ADMIN — carboxymethylcellulose sodium (THERATEARS) 0.25 % ophthalmic solution 2 drop: 2 [drp] | OPHTHALMIC | @ 05:00:00

## 2021-03-14 MED ADMIN — chlorhexidine (PERIDEX) 0.12 % solution 15 mL: 15 mL | TOPICAL | @ 01:00:00

## 2021-03-14 MED ADMIN — heparin (porcine) 5,000 unit/mL injection 5,000 Units: 5000 [IU] | SUBCUTANEOUS | @ 10:00:00

## 2021-03-14 MED ADMIN — oxyCODONE (ROXICODONE) immediate release tablet 10 mg: 10 mg | GASTROENTERAL | @ 15:00:00 | Stop: 2021-03-24

## 2021-03-14 MED ADMIN — lactulose oral solution (30 mL cup): 20 g | ORAL | @ 10:00:00 | Stop: 2021-03-14

## 2021-03-14 MED ADMIN — linezolid in dextrose 5% (ZYVOX) 600 mg/300 mL IVPB 600 mg: 600 mg | INTRAVENOUS | @ 01:00:00 | Stop: 2021-03-19

## 2021-03-14 MED ADMIN — gabapentin (NEURONTIN) oral solution: 100 mg | GASTROENTERAL | @ 18:00:00

## 2021-03-14 NOTE — Unmapped (Signed)
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 *    Interval history: Adam [name] with PMHx of ALL, COPD, prior MRSA bacteremia requiring intubation, MDD, lower back pain, tobacco use hx, presenting with??nausea, vomiting, fever initially found to be hypotensive and tachycardic at oncologists office, started on linezolid.??SOB worsened??in subsequent 24hrs??prompting presentation to the ED.??Admitted to the MICU due to c/f??acute hypoxic respiratory failure secondary to influenza infection with superimposed PNA.??    24 hr inter: Patient continues to fever. Patient FiO2 requirement continues to decrease slightly to 40% FiO2, vent ssetting have not changed. Will obtain echo, and potentially CT chest if patient O2 status does not improve.    Neurological   Sedation   Intubated secondary to worsening hypoxia, s/p vecuronium x1. Continuing to wean sedation. Patient became awake overnight, endorsed pain. Will increase analgesia and will continue sedation with ativan at same strength.     - Continue PRN dilaudid 2q 2hrs  - Continue scheduled Ativan 2 q6hrs  - increase schedule oxycodone to 10 q4hrs  - Continue gabapentin 100 q8hrs (dose reduced for renal function)    Chronic??Pain??- Peripheral neuropathy  Has chronic pain at baseline from??ALL. Follows with [facility].   - See above    MDD  Holding home clonazapam and nortriptyline   ??  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 Stable.     Pulmonary   AHRF 2/2 Influenza w/ superimposed MRSA PNA - s/p Intubation  S/p intubation 12/5 overnight. Patient previously proned, now transferred to supine. Patient FiO2 continues to decrease to 50% from 70% overnight. P/F ratio less than 150 currently, will assess at 16 hours. c/f ARDS given acute onset, bilateral infiltrates, low P/F continues to increase, no pulm edema. Patient continue to be on FiO2 40%. Will continue to ween O2 as tolerated. Patient making slow progress most likely 2/2 to loculated effusions.   -PRVC  - Continue Abx as indicated below   - Droplet Precautions??  - Follow serial ABGs to ensure appropriately compensating for acidosis (q6h)  - Consider CT chest w/o contrast if acute worsening of respiratory status, but otherwise imaging would not change current management.  ??  COPD  20 ppy tobacco use hx, followed by Pulmonology outpt. Per 01/25/21 no prior exacerbations. Utilizes home albuterol.   - O2 Goal 88-92%  - cont ANORO Ellipta  - Holding on steroids  - ABx as listed below  - prn duonebs  ??  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 Underwent unsuccessful thoracentesis, at the time referred to CT surgery, at the time didn't think warranted surgical intervention. POCUS attempted yesterday, did not show any dense consolidations. Obtain  Echo to assess if respiratory status is possibly cardio driven.   -TTE  -Possible CT chest w/o contrast if patient continues to not improve O2 status over the next 48-72 hours    Cardiovascular   Sinus tachycardia??2/2 Sepsis:  - continue to wean norepi     Renal   AKI  Likely prerenal vs ATN. Has not resolved with increasing amounts of fluids so ATN more likely. Stable UOP. Creatinine downtrending  - Consult nephrology, appreciate recommendations -CTM    Infectious Disease/Autoimmune   Influenza c/b MRSA PNA - Hx of MRSA Bacteremia  Leukocytosis with neutrophil predominance, febrile, myalgias. CXR reveals??alveolar process covering the entire R side, with signifcant progression from 12/2 CXR. Empirically given Linezolid/Aztreonam in the ED. 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 See infectious disease note by Dr. Cardell Peach from 03/06/21 for list of prior infections & notable antimicrobial intolerance/allergy list. MRSA screen positive, though BCx remain negative. [facility] from 12/4 is positive for MRSA. S/p Azithromycin 12/3-12/5, stopped given negative urine legionella.   - Infectious disease Immunocomp consulted, follow reccs.  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  History of DRESS syndrome??2/2 cephalosporin  - consulted ICID for reccs re: abx regimen  - Avoid beta lactams    Cultures:  Blood Culture, Routine (no units)   Date Value   03/09/2021 No Growth at 5 days   03/09/2021 No Growth at 5 days     Urine Culture, Comprehensive (no units)   Date Value   03/06/2021 NO GROWTH     Lower Respiratory Culture (no units)   Date Value   03/07/2021 2+ Methicillin resistant Staphylococcus aureus (A)     WBC (10*9/L)   Date Value   03/14/2021 22.1 (H)     WBC, UA (/HPF)   Date Value   03/06/2021 17 (H)          FEN/GI   Constipation-Possible residuals from tube feeds  Improved today. Having multiple BMs. Will decrease bowel regimen  -Lactulose 20g daily  -stop Reglan 10 mg IV TID  -Stop senna 2 tablets BID      Malnutrition Assessment: Not done yet.  Body mass index is 32.2 kg/m??.  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 ??He subsequently had difficulty tolerating single agent dasatinib. S/p 4 cycles of ponatinib-blinatumomab.??He is now MRD-negative and on ponatinib monotherapy  - heme on board: Recc holding home TKI until malignant hematology evals for ready to resume. Patient's family will need update to bring in home supply then.    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 Discussed with heme will obtain repeat IgG next week.       DVT  PPx  - subcutaneous heparin 5000 units q 8hr    Endocrine   NAI    Integumentary   Skin Rash over anterior abdomen   Pictures in media tab. Suspect due to injury during proning. Stable  - Continue to monitor  - Wound care consult    - WOCN consulted for high risk skin assessment Yes.  - cont pressure mitigating precautions per skin policy    Prophylaxis/LDA/Restraints/Consults   Can CVC be removed? N/A, no CVC present (including vascular catheter for HD or PLEX)   Can A-line be removed? No: frequent ABGs  Can Foley be removed? No: Need continuous I/O  Mobility plan: Step 1 - Range of motion    Feeding: Tube feeds at goal  Analgesia: Pain adequately controlled, weaning  Sedation SAT/SBT: N/A  Thromboembolic ppx: SQ heparin  Head of bed >30 degrees: Yes  Ulcer ppx: Yes, mechanical ventilation > 3 days, not on tube feeds  Glucose within target range: Yes, in range    Does patient need/have an active type/screen? No    RASS at goal? Yes  [place] Agitation Assessment Scale (RASS) : 0 (03/14/2021 10:58 AM)     Can antipsychotics be stopped? N/A, not on antipsychotics  CAM-ICU Result: Negative (11/09/2020  9:00 PM)      Would hospice care be appropriate for this patient? No, patient improving or expected to improve  Any unaddressed hospice/palliative care needs? no    Patient Lines/Drains/Airways Status     Active Active Lines, Drains, & Airways     Name Placement date Placement time Site Days    ETT  7.5 03/07/21  2131  -- 6    NG/OG Tube Feedings Center mouth 03/08/21  0106  Center mouth  6    Urethral Catheter 03/07/21  2200  --  6    Peripheral IV 03/06/21 Left Arm 03/06/21  --  Arm  8    Peripheral IV 03/08/21 Right Antecubital 03/08/21  0102  Antecubital  6    Arterial Line 03/07/21 Right Radial 03/07/21  1500  Radial  6              Patient Lines/Drains/Airways Status     Active Wounds     Name Placement date Placement time Site Days    Wound 03/09/21 Pressure Injury Nose Mid Stage 2 03/09/21  1005  Nose  5    Wound 03/09/21 Skin Tear Chest Left 03/09/21  1030  Chest  5                Goals of Care     Code Status: Full Code    Designated Healthcare Decision Maker:  Mr. Glinski current decisional capacity for healthcare decision-making is Full capacity. His designated Educational psychologist) is/are   HCDM (patient stated preference): [place], [place] - Spouse - [phone].      Subjective     See 24 hr int    Objective     Vitals - past 24 hours  Temp:  [38.4 ??C (101.1 ??F)-38.7 ??C (101.7 ??F)] 38.4 ??C (101.1 ??F)  Heart Rate:  [83-121] 89  SpO2 Pulse:  [83-121] 89  Resp:  [17-32] 28  FiO2 (%):  [50 %] 50 %  SpO2:  [93 %-100 %] 96 % Intake/Output  I/O last 3 completed shifts:  In: 2110.6 [I.V.:50.6; NG/GT:1160; IV Piggyback:900]  Out: 4825 [Urine:4825]     Physical Exam:    General:??Appears comfortable, sedated in NAD   HEENT:??NCAT, reports no sinus tenderness. Moist  mucus membranes.  CV:??sinus tachycardia, no m/r/g. No clubbing  Pulm:??Ventilator breath sounds, 40% FiO2  GI:??Abdomen distended, soft, non-tender. Bowel sounds present.  MSK:??No rigidity. Diffuse myalgia. No LE swelling/size discrepancy.  Skin:??Unchanged Abrasion to left anterior chest wall, no crepitus, linear erythema on anterior abdomen  Neuro:??Sedated, not able to follow commands or track.      Continuous Infusions:   ??? sodium chloride         Scheduled Medications:   ??? alteplase  1 mg Intravenous Once   ??? carboxymethylcellulose sodium  2 drop Both Eyes Q6H SCH   ??? chlorhexidine  15 mL Topical BID   ??? famotidine  20 mg Enteral tube: gastric  At bedtime   ??? gabapentin  100 mg Enteral tube: gastric  [facility]   ??? heparin (porcine) for subcutaneous use  5,000 Units Subcutaneous Q8H [facility]   ??? flu vacc qs2022-23 6mos up(PF)  0.5 mL Intramuscular During hospitalization   ??? ipratropium-albuteroL  3 mL Nebulization Q6H (RT)   ??? [START ON 03/15/2021] lactulose  20 g Enteral tube: gastric  Daily   ??? linezolid  600 mg Intravenous Q12H [facility]   ??? LORazepam  2 mg Intravenous Q6H   ??? melatonin  3 mg Enteral tube: gastric  QPM   ??? oxyCODONE  10 mg Enteral tube: gastric  Q4H SCH   ??? senna  2 tablet Oral BID   ??? sulfamethoxazole-trimethoprim  1 tablet Oral 2 times per day on Sun Sat   ??? valACYclovir  500 mg Oral Daily       PRN medications:  acetaminophen, acetaminophen **AND** diphenhydrAMINE, albuterol, bisacodyL, Implement **AND** Care order/instruction **AND** Vital signs **AND** sodium chloride **AND** sodium chloride 0.9% **AND** diphenhydrAMINE **AND** famotidine **AND** meperidine **AND** methylPREDNISolone sodium succinate (PF) **AND** EPINEPHrine, haloperidol lactate, HYDROmorphone, LORazepam, ondansetron    Data/Imaging Review: Reviewed in Epic and personally interpreted on 03/14/2021. See EMR for detailed results.

## 2021-03-14 NOTE — Unmapped (Signed)
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 *          Cross cover summary     Restarted TF since off pressors   No changes in vent settings, but ordered BCx per ID reccs: Concern is that the MRSA in his lungs could easily turn into bacteremia and linezolid would be inadequate in that case  Awake and uncomfortable despite PRNS, started precedex drip at 0.2, now 0.4.      Juanda Chance, MD

## 2021-03-14 NOTE — Unmapped (Signed)
Respiratory Safety Check    Date: 03/14/2021     PPV Device at bedside:   [x]  Yes  []  Replaced  []  N/A    PPV mask at bedside:  [x]  Yes  []  Replaced  []  N/A    Checked humidifier, inspiratory limb, and expiratory limb:   [x]  Yes  []  Replaced  []  N/A    Backup trachs and supplies available at bedside:  []  Yes  []  Replaced  [x]  N/A    Documented trach airway form visible at bedside:   []  Yes  []  Replaced  [x]  N/A    iNO connected with PPV device:  []  Yes  []  Replaced  [x]  N/A

## 2021-03-14 NOTE — Unmapped (Signed)
Respiratory Safety Check    Date: 03/13/2021     PPV Device at bedside:   [x]  Yes  []  Replaced  []  N/A    PPV mask at bedside:  []  Yes  []  Replaced  []  N/A    Checked humidifier, inspiratory limb, and expiratory limb:   [x]  Yes  []  Replaced  []  N/A    Backup trachs and supplies available at bedside:  []  Yes  []  Replaced  [x]  N/A    Documented trach airway form visible at bedside:   []  Yes  []  Replaced  [x]  N/A    iNO connected with PPV device:  []  Yes  []  Replaced  [x]  N/A

## 2021-03-14 NOTE — Unmapped (Signed)
IMMUNOCOMPROMISED HOST INFECTIOUS DISEASE PROGRESS NOTE      Adam Keith is being seen in consultation at the request of Adam Busman, MD for evaluation of pneumonia.    Assessment/Recommendations:    Adam 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 Patient received linezolid and oseltamavir (s/p 5d, through 12/7), as well as initial aztreonam. We are concerned that his persistent fevers may be secondary to a MRSA pulmonary abscess/empyema, and recommend when possible for CT chest to evaluate this further. Patient tolerated IVIG 12/9, continues to be febrile.    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 krusei fungemia  - Prior chemotherapy: [id number] induction with dasatinib (vincristine, dexamethasone and dasatinib); C1D1 01/24/2020; difficulty tolerating single-agent dasatinib  - Current chemotherapy: blinatumomab (anti-CD19/CD3) + ponatinib (TKI) s/p C4 on 12/14/20; 01/2021 now on ponatinib monotherapy maintenance  - 01/11/21: Normocellular bone marrow (30% overall) with trilineage hematopoiesis and less than 1% blasts by manual aspirate differential; Flow cytometry MRD analysis reveals no definitive immunophenotypic evidence of residual B lymphoblastic leukemia; BCR-ABL p210 transcripts were detected at a level of 0.002 IS % ratio in bone marrow.    # Hypogammaglobulimia  - 11/01/20 IgG 266 declined IVIG  - 03/11/21 IgG 148 - IVIG infusion stopped as patient developed rigors, tachycardia and HTN during infusion despite pre-medication   - 03/12/21 IgG 213 - completed IVIG with premedications and slower rate of infusion    # AKI  Estimated Creatinine Clearance: 53.5 mL/min (A) (based on SCr of 2.36 mg/dL (H)).  - b/l Cr ~1.6-1.8  - 12/9 Nephrology considering CRRT.     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  - 12/6 tracheal aspirate: MRSA  - 12/8 unable to tolerate IVIG  - 12/9 IVIg  Tx : Linezolid /Oseltamivir---> Linezolid/aztreonam/flagyl/azithromycin/oseltamivir 12/3 --> linezolid/aztreonam/osteltamivir 12/3 -->linezolid [name] 12/5--->linezolid 12/6 -->    #Left upper chest, pressure ulcer from proning 03/11/21    Prior infections:  #MRSA RLL PNA c/b bacteremia 11/01/2020, probable right empyema 8/12/202; presumed relapsed RML MRSA pneumonia 11/18/2020  - 8/19 Failed thoracocentesis by IP  - 9/26 CT chest parenchyma without e/o infection, persistent but decreased loculated effusions on right  - s/p MRSA antibiotics until 01/04/2021  #Persistent right chest pain 12/29/20; Low grade fever, weakness 01/28/21  - 02/02/2021 - Dr. 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 F/u 12/10 BCx and repeat BCx for new fever spikes  ?? CT chest when able; appreciate MICU team POCUS - goal to evaluate for empyema which would require additional drainage and source control  ?? Send C.diff if within guidelines    Monitoring for antimicrobial toxicities  ??? Monitor CBC with diff and CMP at least twice weekly    Treatment  # Influenza A with superimposed MRSA bacterial pneumonia 03/05/21  ??? CONTINUE linezolid 600 mg bid for treatment of MRSA pneumonia - anticipate 10-14 day course or longer if there is evidence of empyema on CT  ??? CONTINGENCY PLAN: If hypotensive, START daptomycin 10mg /kg + aztreonam + micafungin 100    Prophylaxis  ??? Cont valacyclovir, bactrim  ??? Recommend COVID BV after discharge          The [facility] ID service will continue to follow.  Please page the ID Transplant/Liquid Oncology Fellow consult at [phone] with questions.  Patient was discussed with Dr Jimmye Norman.    [name], MD, MPH  Fellow, Division of Infectious Diseases    Interval HPI:      Source of information includes:  Electronic Medical Records.  History obtained from: EMR , primary team, wife [name] by bedside  Interval events since last encounter: Steady fevers in 38s, this on 2g Tylenol/24h and cooling blanket. Borderline tachycardic. Remains intubated, FiO2 40-50% (improvement), PEEP 12. Not hypotensive.  HPI: Patient unable to provide. RN reports multiple bowel movements overnight. [name] did not see these. She says he is more alert and opening eyes on Precedex. She notes new bullae on R back.  ROS: intubated, sedated, unable to obtain    Medications:   Antimicrobials:  Linezolid 12/3 ->  Daptomycin 12/3 X 1  Oseltamivir 12/2 ->12/7  Aztreonam 12/3 -> 12/6  Azithromycin 12/3 -12/4   Metronidazole 12/3 x 1    Ppx: TMP/SMX, valacyclovir    Current/Prior immunomodulators:  ponatinib 30mg  monotherapy (held)    Other medications reviewed.        Vital Signs last 24 hours:  Temp:  [38.4 ??C (101.1 ??F)-38.7 ??C (101.7 ??F)] 38.4 ??C (101.1 ??F)  Heart Rate:  [83-121] 96  SpO2 Pulse:  [83-121] 96  Resp:  [17-32] 29  A BP-2: (108-211)/(57-93) 143/74  MAP:  [70 mmHg-127 mmHg] 93 mmHg  FiO2 (%):  [50 %-60 %] 50 %  SpO2:  [93 %-100 %] 94 %    Physical Exam:   Patient Lines/Drains/Airways Status     Active Active Lines, Drains, & Airways     Name Placement date Placement time Site Days    ETT  7.5 03/07/21  2131  -- 6    NG/OG Tube Feedings Center mouth 03/08/21  0106  Center mouth  6    Urethral Catheter 03/07/21  2200  --  6    Peripheral IV 03/06/21 Left Arm 03/06/21  --  Arm  8    Peripheral IV 03/08/21 Right Antecubital 03/08/21  0102  Antecubital  6    Arterial Line 03/07/21 Right Radial 03/07/21  1500  Radial  6                Const [x]  vital signs above    []  NAD, non-toxic appearance []  Chronically ill-appearing, non-distressed  Intubated, sedated but opening eyes and reacting some to exam, on vent      Eyes [x]  Lids normal bilaterally, conjunctiva anicteric and noninjected OU     [] PERRL  [] EOMI        ENMT [x]  Normal appearance of external nose and ears, no nasal discharge        [x]  MMM, no lesions on lips or gums []  No thrush, leukoplakia, oral lesions  []  Dentition good []  Edentulous []  Dental caries present  []  Hearing normal  []  TMs with good light reflexes bilaterally   Intubated      Neck [x]  Neck of normal appearance and trachea midline        []  No thyromegaly, nodules, or tenderness   []  Full neck ROM        Lymph []  No LAD in neck     []  No LAD in supraclavicular area     []  No LAD in axillae   []  No LAD in epitrochlear chains     []  No LAD in inguinal areas        CV []  RRR            [x]  No peripheral edema     [x]  Pedal pulses intact   [x]  No abnormal heart sounds appreciated   [x]  Extremities WWP   Regular, borderline tachy      Resp []  Normal WOB at rest    []  No breathlessness with speaking, no coughing  []  CTA anteriorly    []  CTA posteriorly    Ventilated; coarse breath sounds bilaterally      GI [x]  Normal inspection, NTND   [x]  NABS     [x]  No umbilical hernia on exam       []  No hepatosplenomegaly     []  Inspection of perineal and perianal areas normal        GU [x]  Normal external genitalia     [] No urinary catheter present in urethra   []  No CVA tenderness    []  No tenderness over renal allograft  Foley present      MSK [x]  No clubbing or cyanosis of hands       []  No vertebral point tenderness  [x]  No focal tenderness or abnormalities on palpation of joints in RUE, LUE, RLE, or LLE        Skin [x]  No rashes, lesions, or ulcers of visualized skin     [x]  Skin warm and dry to palpation   Bullae on back not visualized; L anterior chest pressure ulcer covered      Neuro [x]  Face expression symmetric  []  Sensation to light touch grossly intact throughout    []  Moves extremities equally    [x]  No tremor noted        []  CNs II-XII grossly intact     []  DTRs normal and symmetric throughout []  Gait unremarkable  Intubated, on Precedex, unable to assess  Psych []  Appropriate affect       []  Fluent speech         []  Attentive, good eye contact  []  Oriented to person, place, time          []  Judgment and insight are appropriate   Intubated, on Precedex, unable to assess Data for Medical Decision Making     Recent Labs   Lab Units 03/14/21  0315 03/11/21  1346 03/11/21  1102   WBC 10*9/L 22.1*   < >  --    HEMOGLOBIN g/dL 9.4*   < >  --    HEMOGLOBIN BG   --    < >  --    PLATELET COUNT (1) 10*9/L 262   < >  --    NEUTRO ABS 10*9/L 19.9*   < >  --    LYMPHO ABS 10*9/L 0.9*   < >  --    EOSINO ABS 10*9/L 0.1   < >  --    SODIUM WHOLE BLOOD   --    < >  --    SODIUM mmol/L 141   < >  --    POTASSIUM WHOLE BLOOD   --    < >  --    POTASSIUM mmol/L 3.8   < >  --    BUN mg/dL 55*   < >  --    CREATININE mg/dL 1.61*   < >  --    GLUCOSE mg/dL 096   < >  --    CALCIUM mg/dL 8.8   < >  --    MAGNESIUM mg/dL 2.0   < >  --    PHOSPHORUS mg/dL 3.7   < >  --    BILIRUBIN TOTAL mg/dL 0.9   < >  --    AST U/L 37*   < >  --    ALT U/L 19   < >  --    IGG mg/dL  --   --  045*    < > = values in this interval not displayed.     I reviewed and noted the following labs: persistent leukocytosis 22.1. Anemia with Hgb 9.4. Cr elevated 2.36, decreasing. AST/ALT 37/19.    Microbiology:  Past cultures were reviewed in Epic and CareEverywhere.  Microbiology Results (last day)     Procedure Component Value Date/Time Date/Time    Blood Culture #1 [[id number]]  (Normal) Collected: 03/09/21 0458    Lab Status: Final result Specimen: Blood from 1 Peripheral Draw Updated: 03/14/21 0545     Blood Culture, Routine No Growth at 5 days    Blood Culture #2 [[id number]]  (Normal) Collected: 03/09/21 0458    Lab Status: Final result Specimen: Blood from 1 Peripheral Draw Updated: 03/14/21 0545     Blood Culture, Routine No Growth at 5 days    Lower Respiratory Culture [[id number]] Collected: 03/14/21 0254    Lab Status: Preliminary result Specimen: Sputum from Lung, Combined Updated: 03/14/21 0323     Gram Stain <10  Epithelial cells/LPF      >25 PMNS/LPF      1+ Gram positive cocci      Yeast present      Acceptable for culture    Narrative:      Specimen Source: Lung, Combined    Blood Culture, Adult [[id number]] Collected: 03/13/21 2339    Lab Status: In process Specimen: Blood from 1 Peripheral Draw Updated: 03/13/21 2344    Blood  Culture, Adult [[id number]] Collected: 03/13/21 2339    Lab Status: In process Specimen: Blood from 1 Peripheral Draw Updated: 03/13/21 2344        Imaging:  No results found.  XR Chest 12/7  Similar patchy and confluent consolidation in the right lung, greatest in the mid zone. Persistent left lower lung consolidation.   Small right pleural effusion. No pneumothorax.  Stable cardiomediastinal silhouette.     XR Chest 12/5 : Interval increase in right hemithorax consolidation. Persistent retrocardiac consolidation.No pleural effusion or pneumothorax    XR Chest 12/4: Slight increase in nonspecific left basilar opacities. Patchy and confluent heterogeneous opacities in the right upper lung are similar.      XR Chest Portable 03/06/2021  Marked interval worsening of airspace consolidation in the right upper and midlung zones. The left lung is clear.  No pleural effusion or pneumothorax.  Stable cardiomediastinal silhouette.    I have reviewed the images from 03/10/21 and agree with the findings.    Additional Studies:   (12/10) EKG QTc 427

## 2021-03-14 NOTE — Unmapped (Signed)
Patient remains vent dependent with noted patent stable airway. Patient stable on current vent support. Patient tolerated PS 10/10 .50 x 2 hours today. Plan is to continue to wean vent support as tolerated and monitor respiratory status ongoing.

## 2021-03-14 NOTE — Unmapped (Signed)
Patient remains intubated and sedated. Following commands and moving all extremities. Precedex iniatiated overnight to maintain RASS of 0 to -1. Remains stable on vent. Febrile overnight. Cultures obtained and PRN tylenol given with good effect along with cooling blanket. BP stable. Normal sinus to Sinus tach. 3 Bms overnight. Tube feeds re-started at midnight. Good urine output through foley. Q2 turns. Wife at bedside throughout shift and updated throughout  Problem: Adult Inpatient Plan of Care  Goal: Plan of Care Review  Outcome: Ongoing - Unchanged  Goal: Patient-Specific Goal (Individualized)  Outcome: Ongoing - Unchanged  Goal: Absence of Hospital-Acquired Illness or Injury  Outcome: Ongoing - Unchanged  Intervention: Identify and Manage Fall Risk  Recent Flowsheet Documentation  Taken 03/14/2021 0600 by Ocie Cornfield, RN  Safety Interventions:   aspiration precautions   bed alarm  Taken 03/14/2021 0400 by Ocie Cornfield, RN  Safety Interventions: aspiration precautions  Taken 03/14/2021 0200 by Ocie Cornfield, RN  Safety Interventions:   aspiration precautions   bleeding precautions  Taken 03/14/2021 0000 by Ocie Cornfield, RN  Safety Interventions:   aspiration precautions   bed alarm   bleeding precautions  Taken 03/13/2021 2000 by Ocie Cornfield, RN  Safety Interventions:   aspiration precautions   bleeding precautions  Intervention: Prevent and Manage VTE (Venous Thromboembolism) Risk  Recent Flowsheet Documentation  Taken 03/14/2021 0600 by Ocie Cornfield, RN  Activity Management: bedrest  Taken 03/14/2021 0400 by Ocie Cornfield, RN  Activity Management: bedrest  Taken 03/14/2021 0200 by Ocie Cornfield, RN  Activity Management: bedrest  Taken 03/14/2021 0000 by Ocie Cornfield, RN  Activity Management: bedrest  Taken 03/13/2021 2200 by Ocie Cornfield, RN  Activity Management: bedrest  Taken 03/13/2021 2000 by Ocie Cornfield, RN  Activity Management: bedrest  Intervention: Prevent Infection  Recent Flowsheet Documentation  Taken 03/14/2021 0600 by Ocie Cornfield, RN  Infection Prevention:   environmental surveillance performed   equipment surfaces disinfected   hand hygiene promoted   personal protective equipment utilized   rest/sleep promoted   single patient room provided   visitors restricted/screened  Taken 03/14/2021 0400 by Ocie Cornfield, RN  Infection Prevention:   environmental surveillance performed   equipment surfaces disinfected   hand hygiene promoted   personal protective equipment utilized   rest/sleep promoted   single patient room provided   visitors restricted/screened  Taken 03/14/2021 0200 by Ocie Cornfield, RN  Infection Prevention:   environmental surveillance performed   equipment surfaces disinfected   hand hygiene promoted   personal protective equipment utilized   rest/sleep promoted   single patient room provided   visitors restricted/screened  Taken 03/14/2021 0000 by Ocie Cornfield, RN  Infection Prevention:   environmental surveillance performed   equipment surfaces disinfected   personal protective equipment utilized   rest/sleep promoted   single patient room provided   visitors restricted/screened  Taken 03/13/2021 2000 by Ocie Cornfield, RN  Infection Prevention:   environmental surveillance performed   equipment surfaces disinfected   hand hygiene promoted   personal protective equipment utilized   rest/sleep promoted   single patient room provided   visitors restricted/screened  Goal: Optimal Comfort and Wellbeing  Outcome: Ongoing - Unchanged  Goal: Readiness for Transition of Care  Outcome: Ongoing - Unchanged  Goal: Rounds/Family Conference  Outcome: Ongoing - Unchanged     Problem: Infection  Goal: Absence of Infection Signs and Symptoms  Outcome: Ongoing - Unchanged  Intervention: Prevent or Manage Infection  Recent Flowsheet Documentation  Taken 03/14/2021 0600 by Morrie Sheldon  Alm Bustard, RN  Infection Management: aseptic technique maintained  Isolation Precautions:   contact precautions maintained   droplet precautions maintained  Taken 03/14/2021 0400 by Ocie Cornfield, RN  Infection Management: aseptic technique maintained  Isolation Precautions: droplet precautions maintained  Taken 03/14/2021 0200 by Ocie Cornfield, RN  Infection Management: aseptic technique maintained  Isolation Precautions:   contact precautions maintained   droplet precautions maintained  Taken 03/14/2021 0000 by Ocie Cornfield, RN  Infection Management: aseptic technique maintained  Isolation Precautions:   contact precautions maintained   droplet precautions maintained  Taken 03/13/2021 2000 by Ocie Cornfield, RN  Infection Management:   aseptic technique maintained   cultures obtained and sent to lab  Isolation Precautions:   contact precautions maintained   droplet precautions maintained     Problem: Skin Injury Risk Increased  Goal: Skin Health and Integrity  Outcome: Ongoing - Unchanged  Intervention: Optimize Skin Protection  Recent Flowsheet Documentation  Taken 03/14/2021 0600 by Ocie Cornfield, RN  Pressure Reduction Techniques: weight shift assistance provided  Head of Bed [facility]) Positioning: HOB at 30-45 degrees  Taken 03/14/2021 0400 by Ocie Cornfield, RN  Pressure Reduction Techniques:   heels elevated off bed   weight shift assistance provided  Head of Bed (HOB) Positioning: HOB at 30-45 degrees  Pressure Reduction Devices: pressure-redistributing mattress utilized  Taken 03/14/2021 0200 by Ocie Cornfield, RN  Pressure Reduction Techniques:   heels elevated off bed   weight shift assistance provided  Taken 03/14/2021 0000 by Ocie Cornfield, RN  Pressure Reduction Techniques: weight shift assistance provided  Head of Bed (HOB) Positioning: HOB at 30-45 degrees  Pressure Reduction Devices: pressure-redistributing mattress utilized  Taken 03/13/2021 2200 by Ocie Cornfield, RN  Pressure Reduction Techniques: weight shift assistance provided  Head of Bed [facility]) Positioning: HOB at 30-45 degrees  Taken 03/13/2021 2000 by Ocie Cornfield, RN  Pressure Reduction Techniques: heels elevated off bed  Head of Bed (HOB) Positioning: HOB at 30-45 degrees  Pressure Reduction Devices: pressure-redistributing mattress utilized     Problem: Fluid Imbalance (Pneumonia)  Goal: Fluid Balance  Outcome: Ongoing - Unchanged     Problem: Infection (Pneumonia)  Goal: Resolution of Infection Signs and Symptoms  Outcome: Ongoing - Unchanged  Intervention: Prevent Infection Progression  Recent Flowsheet Documentation  Taken 03/14/2021 0600 by Ocie Cornfield, RN  Infection Management: aseptic technique maintained  Isolation Precautions:   contact precautions maintained   droplet precautions maintained  Taken 03/14/2021 0400 by Ocie Cornfield, RN  Infection Management: aseptic technique maintained  Isolation Precautions: droplet precautions maintained  Taken 03/14/2021 0200 by Ocie Cornfield, RN  Infection Management: aseptic technique maintained  Isolation Precautions:   contact precautions maintained   droplet precautions maintained  Taken 03/14/2021 0000 by Ocie Cornfield, RN  Infection Management: aseptic technique maintained  Isolation Precautions:   contact precautions maintained   droplet precautions maintained  Taken 03/13/2021 2000 by Ocie Cornfield, RN  Infection Management:   aseptic technique maintained   cultures obtained and sent to lab  Isolation Precautions:   contact precautions maintained   droplet precautions maintained     Problem: Respiratory Compromise (Pneumonia)  Goal: Effective Oxygenation and Ventilation  Outcome: Ongoing - Unchanged  Intervention: Optimize Oxygenation and Ventilation  Recent Flowsheet Documentation  Taken 03/14/2021 0600 by Ocie Cornfield, RN  Head of Bed [facility]) Positioning: HOB at 30-45 degrees  Taken 03/14/2021 0400 by Ocie Cornfield, RN  Head of Bed [facility]) Positioning: HOB at 30-45 degrees  Taken 03/14/2021 0000 by Ocie Cornfield,  RN  Head of Bed [facility]) Positioning: HOB at 30-45 degrees  Taken 03/13/2021 2200 by Ocie Cornfield, RN  Head of Bed [facility]) Positioning: HOB at 30-45 degrees  Taken 03/13/2021 2000 by Ocie Cornfield, RN  Head of Bed [facility]) Positioning: HOB at 30-45 degrees     Problem: Pain Chronic (Persistent) (Comorbidity Management)  Goal: Acceptable Pain Control and Functional Ability  Outcome: Ongoing - Unchanged     Problem: Self-Care Deficit  Goal: Improved Ability to Complete Activities of Daily Living  Outcome: Ongoing - Unchanged     Problem: Communication Impairment (Mechanical Ventilation, Invasive)  Goal: Effective Communication  Outcome: Ongoing - Unchanged     Problem: Device-Related Complication Risk (Mechanical Ventilation, Invasive)  Goal: Optimal Device Function  Outcome: Ongoing - Unchanged  Intervention: Optimize Device Care and Function  Recent Flowsheet Documentation  Taken 03/14/2021 0600 by Ocie Cornfield, RN  Aspiration Precautions:   oral hygiene care promoted   respiratory status monitored   tube feeding placement verified  Taken 03/14/2021 0400 by Ocie Cornfield, RN  Aspiration Precautions: upright posture maintained  Taken 03/14/2021 0200 by Ocie Cornfield, RN  Aspiration Precautions:   upright posture maintained   tube feeding placement verified   respiratory status monitored  Taken 03/14/2021 0000 by Ocie Cornfield, RN  Aspiration Precautions:   upright posture maintained   respiratory status monitored  Taken 03/13/2021 2000 by Ocie Cornfield, RN  Aspiration Precautions: oral hygiene care promoted     Problem: Inability to Wean (Mechanical Ventilation, Invasive)  Goal: Mechanical Ventilation Liberation  Outcome: Ongoing - Unchanged     Problem: Nutrition Impairment (Mechanical Ventilation, Invasive)  Goal: Optimal Nutrition Delivery  Outcome: Ongoing - Unchanged     Problem: Skin and Tissue Injury (Mechanical Ventilation, Invasive)  Goal: Absence of Device-Related Skin and Tissue Injury  Outcome: Ongoing - Unchanged     Problem: Ventilator-Induced Lung Injury (Mechanical Ventilation, Invasive)  Goal: Absence of Ventilator-Induced Lung Injury  Outcome: Ongoing - Unchanged  Intervention: Prevent Ventilator-Associated Pneumonia  Recent Flowsheet Documentation  Taken 03/14/2021 0600 by Ocie Cornfield, RN  Head of Bed [facility]) Positioning: HOB at 30-45 degrees  Oral Care: suction provided  Suction Type: Oral  Taken 03/14/2021 0400 by Ocie Cornfield, RN  Head of Bed [facility]) Positioning: HOB at 30-45 degrees  Oral Care:   mouth swabbed   suction provided  Suction Type: Oral  Taken 03/14/2021 0000 by Ocie Cornfield, RN  Head of Bed [facility]) Positioning: HOB at 30-45 degrees  Oral Care: mouth swabbed  Suction Type: Oral  Taken 03/13/2021 2200 by Ocie Cornfield, RN  Head of Bed [facility]) Positioning: HOB at 30-45 degrees  Taken 03/13/2021 2000 by Ocie Cornfield, RN  Head of Bed [facility]) Positioning: HOB at 30-45 degrees  Oral Care:   suction provided   mouth swabbed  Suction Type: Oral     Problem: Impaired Wound Healing  Goal: Optimal Wound Healing  Outcome: Ongoing - Unchanged  Intervention: Promote Wound Healing  Recent Flowsheet Documentation  Taken 03/14/2021 0600 by Ocie Cornfield, RN  Activity Management: bedrest  Taken 03/14/2021 0400 by Ocie Cornfield, RN  Activity Management: bedrest  Taken 03/14/2021 0200 by Ocie Cornfield, RN  Activity Management: bedrest  Taken 03/14/2021 0000 by Ocie Cornfield, RN  Activity Management: bedrest  Taken 03/13/2021 2200 by Ocie Cornfield, RN  Activity Management: bedrest  Taken 03/13/2021 2000 by Ocie Cornfield, RN  Activity Management: bedrest    the night.

## 2021-03-14 NOTE — Unmapped (Addendum)
IMMUNOCOMPROMISED HOST INFECTIOUS DISEASE PROGRESS NOTE      Adam Keith is being seen in consultation at the request of [name], MD for evaluation of pneumonia.    Assessment/Recommendations:    Adam 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 Patient received linezolid and oseltamavir (s/p 5d, through 12/7), as well as initial aztreonam. We are concerned that his persistent fevers may be secondary to a MRSA pulmonary abscess/empyema, and recommend when possible for CT chest to evaluate this further. Patient tolerated IVIG 12/9, continues to be febrile.    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 krusei fungemia  - Prior chemotherapy: [id number] induction with dasatinib (vincristine, dexamethasone and dasatinib); C1D1 01/24/2020; difficulty tolerating single-agent dasatinib  - Current chemotherapy: blinatumomab (anti-CD19/CD3) + ponatinib (TKI) s/p C4 on 12/14/20; 01/2021 now on ponatinib monotherapy maintenance  - 01/11/21: Normocellular bone marrow (30% overall) with trilineage hematopoiesis and less than 1% blasts by manual aspirate differential; Flow cytometry MRD analysis reveals no definitive immunophenotypic evidence of residual B lymphoblastic leukemia; BCR-ABL p210 transcripts were detected at a level of 0.002 IS % ratio in bone marrow.    # Hypogammaglobulimia  - 11/01/20 IgG 266 declined IVIG  - 03/11/21 IgG 148 - IVIG infusion stopped as patient developed rigors, tachycardia and HTN during infusion despite pre-medication   - 03/12/21 IgG 213- completed IVIG with premedications and slower rate of infusion    # AKI  Estimated Creatinine Clearance: 46 mL/min (A) (based on SCr of 2.75 mg/dL (H)).  - b/l Cr ~1.6-1.8  - 12/9 Nephrology considering CRRT.     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  - 12/6 tracheal aspirate: MRSA  - 12/8 unable to tolerate IVIG , another trial today  Tx : Linezolid /Oseltamivir---> Linezolid/aztreonam/flagyl/azithromycin/oseltamivir 12/3 --> linezolid/aztreonam/osteltamivir 12/3 -->linezolid [name] 12/5--->linezolid 12/6 -->    #Left upper chest, pressure ulcer from proning 03/11/21    Prior infections:  #MRSA RLL PNA c/b bacteremia 11/01/2020, probable right empyema 8/12/202; presumed relapsed RML MRSA pneumonia 11/18/2020  - 8/19 Failed thoracocentesis by IP  - 9/26 CT chest parenchyma without e/o infection, persistent but decreased loculated effusions on right  - s/p MRSA antibiotics until 01/04/2021  #Persistent right chest pain 12/29/20; Low grade fever, weakness 01/28/21  - 02/02/2021 - Dr. 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       Repeat BCx today and ~q48h for continued fevers  ?? F/u 12/6 BCx  ?? CT chest v. POCUS to evaluate for pleural effusions/empyema which would require additional drainage and source control    Monitoring for antimicrobial toxicities  ??? Monitor CBC with diff and CMP at least twice weekly    Treatment  # Influenza A with superimposed MRSA bacterial pneumonia 03/05/21  ??? CONTINUE linezolid 600 mg bid for treatment of MRSA pneumonia - anticipate 10-14 day course or longer if there is evidence of empyema on CT  ??? CONTINGENCY PLAN: If hypotensive, START aztreonam + micafungin 100    Prophylaxis  ??? Cont valacyclovir, bactrim  ??? Recommend COVID BV after discharge          The [facility] ID service will continue to follow.  Please page the ID Transplant/Liquid Oncology Fellow consult at [phone] with questions.  Patient was discussed with Dr Jimmye Norman.    [name], MD, MPH  Fellow, Division of Infectious Diseases    Interval HPI:      Source of information includes:  Electronic Medical Records.  History obtained from: EMR , primary team, mother by bedside, bedside RN  Interval events since last encounter: Febrile to high 39.9 around 8 PM. Tachycardic, 100s. Remains intubated, FiO2 60% (stable), PEEP 12. Not hypotensive.  HPI: Patient unable to provide. RN reports a small bowel movement overnight.  ROS: intubated, sedated, unable to obtain    Medications:   Antimicrobials:  Linezolid 12/3 ->  Daptomycin 12/3 X 1  Oseltamivir 12/2 ->12/7  Aztreonam 12/3 -> 12/6  Azithromycin 12/3 -12/4   Metronidazole 12/3 x 1    Ppx: TMP/SMX, valacyclovir    Current/Prior immunomodulators:  ponatinib 30mg  monotherapy (held)    Other medications reviewed.        Vital Signs last 24 hours:  Temp:  [38.4 ??C (101.1 ??F)-38.8 ??C (101.8 ??F)] 38.4 ??C (101.1 ??F)  Heart Rate:  [102-124] 102  SpO2 Pulse:  [102-124] 102  Resp:  [17-32] 28  A BP-2: (129-211)/(66-93) 137/70  MAP:  [84 mmHg-127 mmHg] 89 mmHg  FiO2 (%):  [50 %-60 %] 50 %  SpO2:  [93 %-98 %] 94 %    Physical Exam:   Patient Lines/Drains/Airways Status     Active Active Lines, Drains, & Airways     Name Placement date Placement time Site Days    ETT  7.5 03/07/21  2131  -- 5    NG/OG Tube Feedings Center mouth 03/08/21  0106  Center mouth  5    Urethral Catheter 03/07/21  2200  --  5    Peripheral IV 03/06/21 Left Arm 03/06/21  --  Arm  7    Peripheral IV 03/08/21 Right Antecubital 03/08/21  0102  Antecubital  5    Arterial Line 03/07/21 Right Radial 03/07/21  1500  Radial  6                Const [x]  vital signs above    []  NAD, non-toxic appearance []  Chronically ill-appearing, non-distressed  Intubated, sedated but opening eyes on vent      Eyes [x]  Lids normal bilaterally, conjunctiva anicteric and noninjected OU     [] PERRL  [] EOMI  Pupils equal      ENMT [x]  Normal appearance of external nose and ears, no nasal discharge        []  MMM, no lesions on lips or gums []  No thrush, leukoplakia, oral lesions  []  Dentition good []  Edentulous []  Dental caries present  []  Hearing normal  []  TMs with good light reflexes bilaterally   Intubated      Neck [x]  Neck of normal appearance and trachea midline        []  No thyromegaly, nodules, or tenderness   []  Full neck ROM        Lymph []  No LAD in neck     []  No LAD in supraclavicular area     []  No LAD in axillae   []  No LAD in epitrochlear chains     []  No LAD in inguinal areas        CV []  RRR            [x]  No peripheral edema     []  Pedal pulses intact   [x]  No abnormal heart sounds appreciated   [x]  Extremities WWP   Regular, tachy      Resp []  Normal WOB at rest    []  No breathlessness with speaking, no coughing  []  CTA anteriorly    []  CTA posteriorly    Ventilated; right lung with mild crackles posteriorly, L side relatively clear      GI []  Normal inspection, NTND   []  NABS     []  No umbilical hernia on exam       []  No hepatosplenomegaly     []  Inspection of perineal and perianal areas normal  Hyperactive bowel sounds; abdomen mildly distended and tender      GU [x]  Normal external genitalia     [] No urinary catheter present in urethra   []  No CVA tenderness    []  No tenderness over renal allograft  Foley present      MSK [x]  No clubbing or cyanosis of hands       []  No vertebral point tenderness  []  No focal tenderness or abnormalities on palpation of joints in RUE, LUE, RLE, or LLE        Skin [x]  No rashes, lesions, or ulcers of visualized skin     []  Skin warm and dry to palpation         Neuro [x]  Face expression symmetric  []  Sensation to light touch grossly intact throughout    []  Moves extremities equally    [x]  No tremor noted        []  CNs II-XII grossly intact     []  DTRs normal and symmetric throughout []  Gait unremarkable        Psych []  Appropriate affect       []  Fluent speech         []  Attentive, good eye contact  []  Oriented to person, place, time          []   Judgment and insight are appropriate           Data for Medical Decision Making     Recent Labs   Lab Units 03/13/21  1349 03/13/21  0349 03/11/21  1346 03/11/21  1102   WBC 10*9/L  --  17.1*   < >  --    HEMOGLOBIN g/dL  --  9.0*   < >  --    HEMOGLOBIN BG   --   --    < >  --    PLATELET COUNT (1) 10*9/L  --  266   < > --    NEUTRO ABS 10*9/L  --  14.7*   < >  --    LYMPHO ABS 10*9/L  --  0.9*   < >  --    EOSINO ABS 10*9/L  --  0.1   < >  --    SODIUM WHOLE BLOOD   --   --    < >  --    SODIUM mmol/L 141 138   < >  --    POTASSIUM WHOLE BLOOD   --   --    < >  --    POTASSIUM mmol/L 3.9 3.8   < >  --    BUN mg/dL 63* 64*   < >  --    CREATININE mg/dL 1.61* 0.96*   < >  --    GLUCOSE mg/dL 045 409   < >  --    CALCIUM mg/dL 8.7 8.5*   < >  --    MAGNESIUM mg/dL 2.0 2.2   < >  --    PHOSPHORUS mg/dL  --  4.5   < >  --    BILIRUBIN TOTAL mg/dL  --  1.4*   < >  --    AST U/L  --  69*   < >  --    ALT U/L  --  24   < >  --    IGG mg/dL  --   --   --  811*    < > = values in this interval not displayed.     I reviewed and noted the following labs: persistent leukocytosis 17.1 . Anemia with Hgb 9.0. Cr elevated 3.13, decreasing.    Microbiology:  Past cultures were reviewed in Epic and CareEverywhere.  Microbiology Results (last day)     Procedure Component Value Date/Time Date/Time    Blood Culture #1 [[id number]]     Lab Status: No result Specimen: Blood from 1 Peripheral Draw     Blood Culture #2 [[id number]]     Lab Status: No result Specimen: Blood from 1 Peripheral Draw     Lower Respiratory Culture [[id number]]     Lab Status: No result Specimen: Sputum from Lung, Combined     Blood Culture #1 [[id number]]  (Normal) Collected: 03/09/21 0458    Lab Status: Preliminary result Specimen: Blood from 1 Peripheral Draw Updated: 03/13/21 0545     Blood Culture, Routine No Growth at 4 days    Blood Culture #2 [[id number]]  (Normal) Collected: 03/09/21 0458    Lab Status: Preliminary result Specimen: Blood from 1 Peripheral Draw Updated: 03/13/21 0545     Blood Culture, Routine No Growth at 4 days          Imaging:  ECG 12 Lead    Result Date: 03/13/2021  SINUS TACHYCARDIA POSSIBLE LEFT ATRIAL ENLARGEMENT BORDERLINE ECG WHEN COMPARED WITH ECG OF 06-Mar-2021  13:39, NO SIGNIFICANT CHANGE WAS FOUND    XR Chest 12/7  Similar patchy and confluent consolidation in the right lung, greatest in the mid zone. Persistent left lower lung consolidation.   Small right pleural effusion. No pneumothorax.  Stable cardiomediastinal silhouette.     XR Chest 12/5 : Interval increase in right hemithorax consolidation. Persistent retrocardiac consolidation.No pleural effusion or pneumothorax    XR Chest 12/4: Slight increase in nonspecific left basilar opacities. Patchy and confluent heterogeneous opacities in the right upper lung are similar.      XR Chest Portable 03/06/2021  Marked interval worsening of airspace consolidation in the right upper and midlung zones. The left lung is clear.  No pleural effusion or pneumothorax.  Stable cardiomediastinal silhouette.    I have reviewed the images from 03/10/21 and agree with the findings.    Additional Studies:   (12/10) EKG QTc 427

## 2021-03-14 NOTE — Unmapped (Signed)
Patient remains vented on PRVC 50%, PEEP 12, TV 500. He is drowsy but able to follow simple commands at times. Bowel regimen successful during shift, BM x3 large during shift today. He remains febrile with temp max of 38.6 during shift. Placed on cooling blanket and given tylenol for relief. He was given dilaudid X1 PRN for discomfort, with facial grimacing noted. He did have one episode of emesis during shift, zofran given and tubefeeds remain held at this time per provider verbal order during rounds. CVL to right internal jugular removed, pressure dressing applied to area. Nephrology bedside for consult today. Vital signs have WDL during shift. Family participated in rounds today and updates have been provided continuously during shift. No acute changes in patient condition, will continue to monitor for changes and report to oncoming nurse at change of shift.       Problem: Adult Inpatient Plan of Care  Goal: Plan of Care Review  Outcome: Ongoing - Unchanged     Problem: Adult Inpatient Plan of Care  Goal: Optimal Comfort and Wellbeing  Outcome: Ongoing - Unchanged     Problem: Adult Inpatient Plan of Care  Goal: Rounds/Family Conference  Outcome: Ongoing - Unchanged

## 2021-03-15 LAB — HEPATIC FUNCTION PANEL
ALBUMIN: 1.9 g/dL — ABNORMAL LOW (ref 3.4–5.0)
ALKALINE PHOSPHATASE: 201 U/L — ABNORMAL HIGH (ref 46–116)
ALT (SGPT): 21 U/L (ref 10–49)
AST (SGOT): 40 U/L — ABNORMAL HIGH (ref <=34)
BILIRUBIN DIRECT: 0.5 mg/dL — ABNORMAL HIGH (ref 0.00–0.30)
BILIRUBIN TOTAL: 0.7 mg/dL (ref 0.3–1.2)
PROTEIN TOTAL: 6.3 g/dL (ref 5.7–8.2)

## 2021-03-15 LAB — BLOOD GAS, ARTERIAL
BASE EXCESS ARTERIAL: -1.2 (ref -2.0–2.0)
BASE EXCESS ARTERIAL: -0.4 (ref -2.0–2.0)
BASE EXCESS ARTERIAL: 0.1 (ref -2.0–2.0)
BASE EXCESS ARTERIAL: -2 (ref -2.0–2.0)
HCO3 ARTERIAL: 22 mmol/L (ref 22–27)
HCO3 ARTERIAL: 24 mmol/L (ref 22–27)
HCO3 ARTERIAL: 24 mmol/L (ref 22–27)
HCO3 ARTERIAL: 22 mmol/L (ref 22–27)
O2 SATURATION ARTERIAL: 93.5 % — ABNORMAL LOW (ref 94.0–100.0)
O2 SATURATION ARTERIAL: 93.8 % — ABNORMAL LOW (ref 94.0–100.0)
O2 SATURATION ARTERIAL: 95.4 % (ref 94.0–100.0)
O2 SATURATION ARTERIAL: 94.2 % (ref 94.0–100.0)
PCO2 ARTERIAL: 33.7 mmHg — ABNORMAL LOW (ref 35.0–45.0)
PCO2 ARTERIAL: 35.2 mmHg (ref 35.0–45.0)
PCO2 ARTERIAL: 35.7 mmHg (ref 35.0–45.0)
PCO2 ARTERIAL: 36.4 mmHg (ref 35.0–45.0)
PH ARTERIAL: 7.44 (ref 7.35–7.45)
PH ARTERIAL: 7.44 (ref 7.35–7.45)
PH ARTERIAL: 7.44 (ref 7.35–7.45)
PH ARTERIAL: 7.4 (ref 7.35–7.45)
PO2 ARTERIAL: 70.2 mmHg — ABNORMAL LOW (ref 80.0–110.0)
PO2 ARTERIAL: 65.8 mmHg — ABNORMAL LOW (ref 80.0–110.0)
PO2 ARTERIAL: 72.5 mmHg — ABNORMAL LOW (ref 80.0–110.0)
PO2 ARTERIAL: 81.1 mmHg (ref 80.0–110.0)

## 2021-03-15 LAB — CBC W/ AUTO DIFF
BASOPHILS ABSOLUTE COUNT: 0.3 10*9/L — ABNORMAL HIGH (ref 0.0–0.1)
BASOPHILS RELATIVE PERCENT: 1.4 %
EOSINOPHILS ABSOLUTE COUNT: 0.1 10*9/L (ref 0.0–0.5)
EOSINOPHILS RELATIVE PERCENT: 0.3 %
HEMATOCRIT: 28.1 % — ABNORMAL LOW (ref 39.0–48.0)
HEMOGLOBIN: 9 g/dL — ABNORMAL LOW (ref 12.9–16.5)
LYMPHOCYTES ABSOLUTE COUNT: 1 10*9/L — ABNORMAL LOW (ref 1.1–3.6)
LYMPHOCYTES RELATIVE PERCENT: 5.4 %
MEAN CORPUSCULAR HEMOGLOBIN CONC: 31.9 g/dL — ABNORMAL LOW (ref 32.0–36.0)
MEAN CORPUSCULAR HEMOGLOBIN: 27.4 pg (ref 25.9–32.4)
MEAN CORPUSCULAR VOLUME: 85.7 fL (ref 77.6–95.7)
MEAN PLATELET VOLUME: 8.7 fL (ref 6.8–10.7)
MONOCYTES ABSOLUTE COUNT: 0.8 10*9/L (ref 0.3–0.8)
MONOCYTES RELATIVE PERCENT: 4.1 %
NEUTROPHILS ABSOLUTE COUNT: 16.5 10*9/L — ABNORMAL HIGH (ref 1.8–7.8)
NEUTROPHILS RELATIVE PERCENT: 88.8 %
PLATELET COUNT: 239 10*9/L (ref 150–450)
RED BLOOD CELL COUNT: 3.28 10*12/L — ABNORMAL LOW (ref 4.26–5.60)
RED CELL DISTRIBUTION WIDTH: 17.6 % — ABNORMAL HIGH (ref 12.2–15.2)
WBC ADJUSTED: 18.6 10*9/L — ABNORMAL HIGH (ref 3.6–11.2)

## 2021-03-15 LAB — BASIC METABOLIC PANEL
ANION GAP: 10 mmol/L (ref 5–14)
ANION GAP: 11 mmol/L (ref 5–14)
BLOOD UREA NITROGEN: 51 mg/dL — ABNORMAL HIGH (ref 9–23)
BLOOD UREA NITROGEN: 48 mg/dL — ABNORMAL HIGH (ref 9–23)
BUN / CREAT RATIO: 22
BUN / CREAT RATIO: 21
CALCIUM: 8.7 mg/dL (ref 8.7–10.4)
CALCIUM: 8.4 mg/dL — ABNORMAL LOW (ref 8.7–10.4)
CHLORIDE: 109 mmol/L — ABNORMAL HIGH (ref 98–107)
CHLORIDE: 111 mmol/L — ABNORMAL HIGH (ref 98–107)
CO2: 23 mmol/L (ref 20.0–31.0)
CO2: 23 mmol/L (ref 20.0–31.0)
CREATININE: 2.3 mg/dL — ABNORMAL HIGH
CREATININE: 2.25 mg/dL — ABNORMAL HIGH
EGFR CKD-EPI (2021) MALE: 36 mL/min/{1.73_m2} — ABNORMAL LOW (ref >=60)
EGFR CKD-EPI (2021) MALE: 37 mL/min/{1.73_m2} — ABNORMAL LOW (ref >=60)
GLUCOSE RANDOM: 104 mg/dL (ref 70–179)
GLUCOSE RANDOM: 99 mg/dL (ref 70–99)
POTASSIUM: 3.4 mmol/L (ref 3.4–4.8)
POTASSIUM: 3.7 mmol/L (ref 3.4–4.8)
SODIUM: 142 mmol/L (ref 135–145)
SODIUM: 145 mmol/L (ref 135–145)

## 2021-03-15 LAB — MAGNESIUM
MAGNESIUM: 1.7 mg/dL (ref 1.6–2.6)
MAGNESIUM: 2.4 mg/dL (ref 1.6–2.6)

## 2021-03-15 LAB — PHOSPHORUS: PHOSPHORUS: 3.9 mg/dL (ref 2.4–5.1)

## 2021-03-15 MED ADMIN — ipratropium-albuteroL (DUO-NEB) 0.5-2.5 mg/3 mL nebulizer solution 3 mL: 3 mL | RESPIRATORY_TRACT | @ 09:00:00

## 2021-03-15 MED ADMIN — HYDROmorphone (PF) (DILAUDID) injection 2 mg: 2 mg | INTRAVENOUS | @ 18:00:00 | Stop: 2021-03-21

## 2021-03-15 MED ADMIN — heparin (porcine) 5,000 unit/mL injection 5,000 Units: 5000 [IU] | SUBCUTANEOUS | @ 18:00:00

## 2021-03-15 MED ADMIN — sulfur hexafluoride microsphreres (LUMASON) intravenous suspension 4 mL: 4 mL | INTRAVENOUS | @ 16:00:00 | Stop: 2021-03-15

## 2021-03-15 MED ADMIN — chlorhexidine (PERIDEX) 0.12 % solution 15 mL: 15 mL | TOPICAL | @ 14:00:00

## 2021-03-15 MED ADMIN — gabapentin (NEURONTIN) oral solution: 100 mg | GASTROENTERAL | @ 18:00:00

## 2021-03-15 MED ADMIN — LORazepam (ATIVAN) injection 2 mg: 2 mg | INTRAVENOUS | @ 22:00:00

## 2021-03-15 MED ADMIN — oxyCODONE (ROXICODONE) immediate release tablet 15 mg: 15 mg | GASTROENTERAL | @ 18:00:00 | Stop: 2021-03-24

## 2021-03-15 MED ADMIN — heparin (porcine) 5,000 unit/mL injection 5,000 Units: 5000 [IU] | SUBCUTANEOUS | @ 03:00:00

## 2021-03-15 MED ADMIN — carboxymethylcellulose sodium (THERATEARS) 0.25 % ophthalmic solution 2 drop: 2 [drp] | OPHTHALMIC | @ 11:00:00

## 2021-03-15 MED ADMIN — LORazepam (ATIVAN) injection 2 mg: 2 mg | INTRAVENOUS | @ 14:00:00 | Stop: 2021-03-15

## 2021-03-15 MED ADMIN — acetaminophen (TYLENOL) tablet 650 mg: 650 mg | ORAL | @ 14:00:00

## 2021-03-15 MED ADMIN — LORazepam (ATIVAN) injection 2 mg: 2 mg | INTRAVENOUS | @ 01:00:00

## 2021-03-15 MED ADMIN — HYDROmorphone (PF) (DILAUDID) injection 2 mg: 2 mg | INTRAVENOUS | @ 22:00:00 | Stop: 2021-03-15

## 2021-03-15 MED ADMIN — LORazepam (ATIVAN) injection 2 mg: 2 mg | INTRAVENOUS | @ 07:00:00 | Stop: 2021-03-15

## 2021-03-15 MED ADMIN — HYDROmorphone (PF) (DILAUDID) injection 2 mg: 2 mg | INTRAVENOUS | @ 12:00:00 | Stop: 2021-03-21

## 2021-03-15 MED ADMIN — LORazepam (ATIVAN) injection 2 mg: 2 mg | INTRAVENOUS | @ 23:00:00

## 2021-03-15 MED ADMIN — sulfamethoxazole-trimethoprim (BACTRIM DS) 800-160 mg tablet 160 mg of trimethoprim: 1 | ORAL | @ 01:00:00 | Stop: 2021-03-28

## 2021-03-15 MED ADMIN — linezolid in dextrose 5% (ZYVOX) 600 mg/300 mL IVPB 600 mg: 600 mg | INTRAVENOUS | @ 14:00:00 | Stop: 2021-03-19

## 2021-03-15 MED ADMIN — metoclopramide (REGLAN) injection 5 mg: 5 mg | INTRAVENOUS | @ 18:00:00

## 2021-03-15 MED ADMIN — famotidine (PEPCID) tablet 20 mg: 20 mg | GASTROENTERAL | @ 01:00:00

## 2021-03-15 MED ADMIN — ipratropium-albuteroL (DUO-NEB) 0.5-2.5 mg/3 mL nebulizer solution 3 mL: 3 mL | RESPIRATORY_TRACT | @ 02:00:00

## 2021-03-15 MED ADMIN — oxyCODONE (ROXICODONE) immediate release tablet 10 mg: 10 mg | GASTROENTERAL | @ 11:00:00 | Stop: 2021-03-15

## 2021-03-15 MED ADMIN — linezolid in dextrose 5% (ZYVOX) 600 mg/300 mL IVPB 600 mg: 600 mg | INTRAVENOUS | @ 01:00:00 | Stop: 2021-03-19

## 2021-03-15 MED ADMIN — oxyCODONE (ROXICODONE) immediate release tablet 15 mg: 15 mg | GASTROENTERAL | @ 23:00:00 | Stop: 2021-03-24

## 2021-03-15 MED ADMIN — oxyCODONE (ROXICODONE) immediate release tablet 15 mg: 15 mg | GASTROENTERAL | @ 15:00:00 | Stop: 2021-03-24

## 2021-03-15 MED ADMIN — oxyCODONE (ROXICODONE) immediate release tablet 10 mg: 10 mg | GASTROENTERAL | @ 03:00:00 | Stop: 2021-03-24

## 2021-03-15 MED ADMIN — gabapentin (NEURONTIN) oral solution: 100 mg | GASTROENTERAL | @ 03:00:00

## 2021-03-15 MED ADMIN — carboxymethylcellulose sodium (THERATEARS) 0.25 % ophthalmic solution 2 drop: 2 [drp] | OPHTHALMIC | @ 23:00:00

## 2021-03-15 MED ADMIN — oxyCODONE (ROXICODONE) immediate release tablet 10 mg: 10 mg | GASTROENTERAL | @ 07:00:00 | Stop: 2021-03-15

## 2021-03-15 MED ADMIN — carboxymethylcellulose sodium (THERATEARS) 0.25 % ophthalmic solution 2 drop: 2 [drp] | OPHTHALMIC | @ 18:00:00

## 2021-03-15 MED ADMIN — LORazepam (ATIVAN) injection 2 mg: 2 mg | INTRAVENOUS | @ 18:00:00

## 2021-03-15 MED ADMIN — chlorhexidine (PERIDEX) 0.12 % solution 15 mL: 15 mL | TOPICAL | @ 01:00:00

## 2021-03-15 MED ADMIN — ipratropium-albuteroL (DUO-NEB) 0.5-2.5 mg/3 mL nebulizer solution 3 mL: 3 mL | RESPIRATORY_TRACT | @ 21:00:00

## 2021-03-15 MED ADMIN — HYDROmorphone (PF) (DILAUDID) injection 2 mg: 2 mg | INTRAVENOUS | @ 21:00:00 | Stop: 2021-03-21

## 2021-03-15 MED ADMIN — melatonin tablet 3 mg: 3 mg | GASTROENTERAL | @ 23:00:00

## 2021-03-15 MED ADMIN — metoclopramide (REGLAN) injection 5 mg: 5 mg | INTRAVENOUS | @ 23:00:00

## 2021-03-15 MED ADMIN — LORazepam (ATIVAN) injection 2 mg: 2 mg | INTRAVENOUS | @ 15:00:00

## 2021-03-15 MED ADMIN — potassium chloride (KLOR-CON) packet 40 mEq: 40 meq | ORAL | @ 14:00:00 | Stop: 2021-03-15

## 2021-03-15 MED ADMIN — valACYclovir (VALTREX) tablet 500 mg: 500 mg | ORAL | @ 14:00:00

## 2021-03-15 MED ADMIN — ipratropium-albuteroL (DUO-NEB) 0.5-2.5 mg/3 mL nebulizer solution 3 mL: 3 mL | RESPIRATORY_TRACT | @ 15:00:00

## 2021-03-15 MED ADMIN — HYDROmorphone (PF) (DILAUDID) injection 2 mg: 2 mg | INTRAVENOUS | @ 01:00:00 | Stop: 2021-03-21

## 2021-03-15 MED ADMIN — carboxymethylcellulose sodium (THERATEARS) 0.25 % ophthalmic solution 2 drop: 2 [drp] | OPHTHALMIC | @ 04:00:00

## 2021-03-15 MED ADMIN — gabapentin (NEURONTIN) oral solution: 100 mg | GASTROENTERAL | @ 11:00:00

## 2021-03-15 MED ADMIN — heparin (porcine) 5,000 unit/mL injection 5,000 Units: 5000 [IU] | SUBCUTANEOUS | @ 11:00:00

## 2021-03-15 MED ADMIN — HYDROmorphone (PF) (DILAUDID) injection 2 mg: 2 mg | INTRAVENOUS | @ 14:00:00 | Stop: 2021-03-21

## 2021-03-15 MED ADMIN — magnesium sulfate 2gm/50mL IVPB: 2 g | INTRAVENOUS | @ 15:00:00 | Stop: 2021-03-15

## 2021-03-15 MED ADMIN — senna (SENOKOT) tablet 2 tablet: 2 | ORAL | @ 01:00:00

## 2021-03-15 MED ADMIN — acetaminophen (TYLENOL) tablet 650 mg: 650 mg | ORAL | @ 04:00:00

## 2021-03-15 NOTE — Unmapped (Signed)
Patient alert and following commands on vent. Vital signs stable but remains febrile. Multiple Bms and good foley output. Tube feeds held per MD for vomiting and persistent abdominal distention. Wife at bedside and updated throughout shift.   Problem: Adult Inpatient Plan of Care  Goal: Plan of Care Review  Outcome: Ongoing - Unchanged  Goal: Patient-Specific Goal (Individualized)  Outcome: Ongoing - Unchanged  Goal: Absence of Hospital-Acquired Illness or Injury  Outcome: Ongoing - Unchanged  Intervention: Identify and Manage Fall Risk  Recent Flowsheet Documentation  Taken 03/15/2021 0600 by Ocie Cornfield, RN  Safety Interventions:   aspiration precautions   bleeding precautions  Taken 03/15/2021 0400 by Ocie Cornfield, RN  Safety Interventions: aspiration precautions  Taken 03/15/2021 0200 by Ocie Cornfield, RN  Safety Interventions:   aspiration precautions   bed alarm  Taken 03/15/2021 0000 by Ocie Cornfield, RN  Safety Interventions:   aspiration precautions   bleeding precautions  Intervention: Prevent Skin Injury  Recent Flowsheet Documentation  Taken 03/15/2021 0000 by Ocie Cornfield, RN  Skin Protection: incontinence pads utilized  Intervention: Prevent and Manage VTE (Venous Thromboembolism) Risk  Recent Flowsheet Documentation  Taken 03/15/2021 0600 by Ocie Cornfield, RN  Activity Management: bedrest  Taken 03/15/2021 0400 by Ocie Cornfield, RN  Activity Management: bedrest  Taken 03/15/2021 0200 by Ocie Cornfield, RN  Activity Management: bedrest  Taken 03/15/2021 0000 by Ocie Cornfield, RN  Activity Management: bedrest  Intervention: Prevent Infection  Recent Flowsheet Documentation  Taken 03/15/2021 0600 by Ocie Cornfield, RN  Infection Prevention:   environmental surveillance performed   equipment surfaces disinfected   hand hygiene promoted   personal protective equipment utilized   single patient room provided  Taken 03/15/2021 0400 by Ocie Cornfield, RN  Infection Prevention:   environmental surveillance performed equipment surfaces disinfected   hand hygiene promoted  Taken 03/15/2021 0200 by Ocie Cornfield, RN  Infection Prevention:   environmental surveillance performed   equipment surfaces disinfected   hand hygiene promoted   single patient room provided  Taken 03/15/2021 0000 by Ocie Cornfield, RN  Infection Prevention:   environmental surveillance performed   equipment surfaces disinfected   hand hygiene promoted   personal protective equipment utilized   visitors restricted/screened   rest/sleep promoted   single patient room provided  Goal: Optimal Comfort and Wellbeing  Outcome: Ongoing - Unchanged  Goal: Readiness for Transition of Care  Outcome: Ongoing - Unchanged  Goal: Rounds/Family Conference  Outcome: Ongoing - Unchanged     Problem: Infection  Goal: Absence of Infection Signs and Symptoms  Outcome: Ongoing - Unchanged  Intervention: Prevent or Manage Infection  Recent Flowsheet Documentation  Taken 03/15/2021 0600 by Ocie Cornfield, RN  Infection Management: aseptic technique maintained  Isolation Precautions:   contact precautions maintained   droplet precautions maintained  Taken 03/15/2021 0400 by Ocie Cornfield, RN  Infection Management: aseptic technique maintained  Isolation Precautions:   contact precautions maintained   droplet precautions maintained  Taken 03/15/2021 0200 by Ocie Cornfield, RN  Infection Management: aseptic technique maintained  Isolation Precautions:   contact precautions maintained   droplet precautions maintained  Taken 03/15/2021 0000 by Ocie Cornfield, RN  Infection Management: aseptic technique maintained  Isolation Precautions:   contact precautions maintained   droplet precautions maintained     Problem: Skin Injury Risk Increased  Goal: Skin Health and Integrity  Outcome: Ongoing - Unchanged  Intervention: Optimize Skin Protection  Recent Flowsheet Documentation  Taken 03/15/2021 0600 by Ocie Cornfield, RN  Pressure  Reduction Techniques: weight shift assistance provided  Head of Bed [facility]) Positioning: HOB at 30-45 degrees  Taken 03/15/2021 0400 by Ocie Cornfield, RN  Pressure Reduction Techniques: heels elevated off bed  Head of Bed (HOB) Positioning: HOB at 30-45 degrees  Pressure Reduction Devices: pressure-redistributing mattress utilized  Taken 03/15/2021 0200 by Ocie Cornfield, RN  Pressure Reduction Techniques: weight shift assistance provided  Head of Bed [facility]) Positioning: HOB at 30-45 degrees  Taken 03/15/2021 0000 by Ocie Cornfield, RN  Pressure Reduction Techniques:   heels elevated off bed   weight shift assistance provided  Head of Bed (HOB) Positioning: HOB at 30-45 degrees  Pressure Reduction Devices: pressure-redistributing mattress utilized  Skin Protection: incontinence pads utilized     Problem: Fluid Imbalance (Pneumonia)  Goal: Fluid Balance  Outcome: Ongoing - Unchanged     Problem: Infection (Pneumonia)  Goal: Resolution of Infection Signs and Symptoms  Outcome: Ongoing - Unchanged  Intervention: Prevent Infection Progression  Recent Flowsheet Documentation  Taken 03/15/2021 0600 by Ocie Cornfield, RN  Infection Management: aseptic technique maintained  Isolation Precautions:   contact precautions maintained   droplet precautions maintained  Taken 03/15/2021 0400 by Ocie Cornfield, RN  Infection Management: aseptic technique maintained  Isolation Precautions:   contact precautions maintained   droplet precautions maintained  Taken 03/15/2021 0200 by Ocie Cornfield, RN  Infection Management: aseptic technique maintained  Isolation Precautions:   contact precautions maintained   droplet precautions maintained  Taken 03/15/2021 0000 by Ocie Cornfield, RN  Infection Management: aseptic technique maintained  Isolation Precautions:   contact precautions maintained   droplet precautions maintained     Problem: Respiratory Compromise (Pneumonia)  Goal: Effective Oxygenation and Ventilation  Outcome: Ongoing - Unchanged  Intervention: Optimize Oxygenation and Ventilation  Recent Flowsheet Documentation  Taken 03/15/2021 0600 by Ocie Cornfield, RN  Head of Bed [facility]) Positioning: HOB at 30-45 degrees  Taken 03/15/2021 0400 by Ocie Cornfield, RN  Head of Bed [facility]) Positioning: HOB at 30-45 degrees  Taken 03/15/2021 0200 by Ocie Cornfield, RN  Head of Bed [facility]) Positioning: HOB at 30-45 degrees  Taken 03/15/2021 0000 by Ocie Cornfield, RN  Head of Bed [facility]) Positioning: HOB at 30-45 degrees     Problem: Pain Chronic (Persistent) (Comorbidity Management)  Goal: Acceptable Pain Control and Functional Ability  Outcome: Ongoing - Unchanged     Problem: Self-Care Deficit  Goal: Improved Ability to Complete Activities of Daily Living  Outcome: Ongoing - Unchanged     Problem: Communication Impairment (Mechanical Ventilation, Invasive)  Goal: Effective Communication  Outcome: Ongoing - Unchanged     Problem: Device-Related Complication Risk (Mechanical Ventilation, Invasive)  Goal: Optimal Device Function  Outcome: Ongoing - Unchanged  Intervention: Optimize Device Care and Function  Recent Flowsheet Documentation  Taken 03/15/2021 0600 by Ocie Cornfield, RN  Aspiration Precautions: upright posture maintained  Taken 03/15/2021 0400 by Ocie Cornfield, RN  Aspiration Precautions:   upright posture maintained   tube feeding placement verified  Taken 03/15/2021 0200 by Ocie Cornfield, RN  Aspiration Precautions:   upright posture maintained   tube feeding placement verified   respiratory status monitored  Taken 03/15/2021 0000 by Ocie Cornfield, RN  Aspiration Precautions:   oral hygiene care promoted   respiratory status monitored   tube feeding placement verified     Problem: Inability to Wean (Mechanical Ventilation, Invasive)  Goal: Mechanical Ventilation Liberation  Outcome: Ongoing - Unchanged     Problem: Nutrition Impairment (Mechanical Ventilation, Invasive)  Goal: Optimal Nutrition Delivery  Outcome: Ongoing - Unchanged     Problem: Skin and Tissue Injury (Mechanical Ventilation, Invasive)  Goal: Absence of Device-Related Skin and Tissue Injury  Outcome: Ongoing - Unchanged  Intervention: Maintain Skin and Tissue Health  Recent Flowsheet Documentation  Taken 03/15/2021 0000 by Ocie Cornfield, RN  Device Skin Pressure Protection:   positioning supports utilized   absorbent pad utilized/changed     Problem: Ventilator-Induced Lung Injury (Mechanical Ventilation, Invasive)  Goal: Absence of Ventilator-Induced Lung Injury  Outcome: Ongoing - Unchanged  Intervention: Prevent Ventilator-Associated Pneumonia  Recent Flowsheet Documentation  Taken 03/15/2021 0600 by Ocie Cornfield, RN  Head of Bed [facility]) Positioning: HOB at 30-45 degrees  Oral Care:   mouth swabbed   suction provided  Taken 03/15/2021 0400 by Ocie Cornfield, RN  Head of Bed [facility]) Positioning: HOB at 30-45 degrees  Oral Care: mouth swabbed  Suction Type: Oral  Taken 03/15/2021 0200 by Ocie Cornfield, RN  Head of Bed [facility]) Positioning: HOB at 30-45 degrees  Oral Care: suction provided  Suction Type: Oral  Taken 03/15/2021 0000 by Ocie Cornfield, RN  Head of Bed [facility]) Positioning: HOB at 30-45 degrees  Oral Care:   mouth swabbed   suction provided   lip/mouth moisturizer applied  Suction Type: Oral     Problem: Impaired Wound Healing  Goal: Optimal Wound Healing  Outcome: Ongoing - Unchanged  Intervention: Promote Wound Healing  Recent Flowsheet Documentation  Taken 03/15/2021 0600 by Ocie Cornfield, RN  Activity Management: bedrest  Taken 03/15/2021 0400 by Ocie Cornfield, RN  Activity Management: bedrest  Taken 03/15/2021 0200 by Ocie Cornfield, RN  Activity Management: bedrest  Taken 03/15/2021 0000 by Ocie Cornfield, RN  Activity Management: bedrest

## 2021-03-15 NOTE — Unmapped (Signed)
Immunocompromised Host ID Attending Addendum  I saw and evaluated the patient. I discussed and agree with the findings and the plan of care as documented in the fellow???s note.The patient is at risk of decompensation from infection due to underlying immunocompromise and/or mucosal barrier defects and/or presence of medical devices.   I personally reviewed updated microbiological culture and susceptibility data.  I personally reviewed updated relevant radiological studies.    Drue Second  Immunocompromised Host Infectious Diseases  Pager [phone]    IMMUNOCOMPROMISED HOST INFECTIOUS DISEASE PROGRESS NOTE      Adam Keith is being seen in consultation at the request of [name], MD for evaluation of pneumonia.    Assessment/Recommendations:    Curties Conigliaro is a 40 y.o. 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 Patient received linezolid and oseltamavir (s/p 5d, through 12/7), as well as initial aztreonam. We are concerned that his persistent fevers may be secondary to a MRSA pulmonary abscess/empyema, and recommend when possible for CT chest to evaluate this further. Given his ongoing fevers while on appropriate therapy would also be reasonable to CT abdomen and pelvis to assess for any other potential sites of infection (although fevers could certainly be attributed to his severe pneumonia.) Patient tolerated IVIG 12/9, continues to be febrile.    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 krusei fungemia  - Prior chemotherapy: [id number] induction with dasatinib (vincristine, dexamethasone and dasatinib); C1D1 01/24/2020; difficulty tolerating single-agent dasatinib  - Current chemotherapy: blinatumomab (anti-CD19/CD3) + ponatinib (TKI) s/p C4 on 12/14/20; 01/2021 now on ponatinib monotherapy maintenance  - 01/11/21: Normocellular bone marrow (30% overall) with trilineage hematopoiesis and less than 1% blasts by manual aspirate differential; Flow cytometry MRD analysis reveals no definitive immunophenotypic evidence of residual B lymphoblastic leukemia; BCR-ABL p210 transcripts were detected at a level of 0.002 IS % ratio in bone marrow.    # Hypogammaglobulimia  - 11/01/20 IgG 266 declined IVIG  - 03/11/21 IgG 148 - IVIG infusion stopped as patient developed rigors, tachycardia and HTN during infusion despite pre-medication   - 03/12/21 IgG 213 - completed IVIG with premedications and slower rate of infusion    # AKI  Estimated Creatinine Clearance: 54.9 mL/min (A) (based on SCr of 2.3 mg/dL (H)).  - b/l Cr ~1.6-1.8  - 12/9 Nephrology considering CRRT.     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  - 12/6 tracheal aspirate: MRSA  - 12/8 unable to tolerate IVIG  - 12/9 IVIg  Tx : Linezolid /Oseltamivir---> Linezolid/aztreonam/flagyl/azithromycin/oseltamivir 12/3 --> linezolid/aztreonam/osteltamivir 12/3 -->linezolid [name] 12/5--->linezolid 12/6 -->    #Left upper chest, pressure ulcer from proning 03/11/21    Prior infections:  #MRSA RLL PNA c/b bacteremia 11/01/2020, probable right empyema 8/12/202; presumed relapsed RML MRSA pneumonia 11/18/2020  - 8/19 Failed thoracocentesis by IP  - 9/26 CT chest parenchyma without e/o infection, persistent but decreased loculated effusions on right  - s/p MRSA antibiotics until 01/04/2021  #Persistent right chest pain 12/29/20; Low grade fever, weakness 01/28/21  - 02/02/2021 - Dr. 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 F/u 12/10 BCx and repeat BCx for new fever spikes  ?? CT chest when able; appreciate MICU team POCUS - goal to evaluate for empyema which would require additional drainage and source control  ?? Consider CT A/P  ?? Send C.diff if within guidelines    Monitoring for antimicrobial toxicities  ??? Monitor CBC with diff and CMP at least twice weekly    Treatment  # Influenza A with superimposed MRSA bacterial pneumonia 03/05/21  ??? CONTINUE linezolid 600 mg bid for treatment of MRSA pneumonia - anticipate 10-14 day course or longer if there is evidence of empyema on CT    Prophylaxis  ??? Cont valacyclovir, bactrim  ??? Recommend COVID BV after discharge          The [facility] ID service will continue to follow.  Please page the ID Transplant/Liquid Oncology Fellow consult at [phone] with questions.  Patient was discussed with Dr Jimmye Norman.    Kandice Moos, MD  Fellow, Division of Infectious Diseases    Interval HPI:      Source of information includes:  Electronic Medical Records.  History obtained from: EMR , primary team  Interval events since last encounter: Steady fevers in 38s, this on 2g Tylenol/24h and cooling blanket. Borderline tachycardic. Remains intubated, FiO2 40-50% (improvement), PEEP 12. Not hypotensive. Will likely get CT chest today  HPI: Patient unable to provide.   ROS: intubated, sedated, unable to obtain    Medications:   Antimicrobials:  Linezolid 12/3 ->  Daptomycin 12/3 X 1  Oseltamivir 12/2 ->12/7  Aztreonam 12/3 -> 12/6  Azithromycin 12/3 -12/4   Metronidazole 12/3 x 1    Ppx: TMP/SMX, valacyclovir    Current/Prior immunomodulators:  ponatinib 30mg  monotherapy (held)    Other medications reviewed.        Vital Signs last 24 hours:  Temp:  [37.8 ??C (100.1 ??F)-38.7 ??C (101.7 ??F)] 38.4 ??C (101.1 ??F)  Heart Rate:  [92-114] 98  SpO2 Pulse:  [92-114] 98  Resp:  [  14-29] 28  A BP-2: (108-176)/(58-87) 133/73  MAP:  [73 mmHg-108 mmHg] 91 mmHg  FiO2 (%):  [40 %-50 %] 50 %  SpO2:  [90 %-97 %] 93 %    Physical Exam:   Patient Lines/Drains/Airways Status     Active Active Lines, Drains, & Airways     Name Placement date Placement time Site Days    ETT  7.5 03/07/21  2131  -- 7    NG/OG Tube Feedings Center mouth 03/08/21  0106  Center mouth  7    Urethral Catheter 03/07/21  2200  --  7    Peripheral IV 03/06/21 Left Arm 03/06/21  --  Arm  9    Peripheral IV 03/08/21 Right Antecubital 03/08/21  0102 Antecubital  7    Arterial Line 03/07/21 Right Radial 03/07/21  1500  Radial  7                Const [x]  vital signs above    []  NAD, non-toxic appearance []  Chronically ill-appearing, non-distressed  Intubated, sedated but opening eyes and reacting some to exam, on vent      Eyes [x]  Lids normal bilaterally, conjunctiva anicteric and noninjected OU     [] PERRL  [] EOMI        ENMT [x]  Normal appearance of external nose and ears, no nasal discharge        [x]  MMM, no lesions on lips or gums []  No thrush, leukoplakia, oral lesions  []  Dentition good []  Edentulous []  Dental caries present  []  Hearing normal  []  TMs with good light reflexes bilaterally   Intubated      Neck [x]  Neck of normal appearance and trachea midline        []  No thyromegaly, nodules, or tenderness   []  Full neck ROM        Lymph []  No LAD in neck     []  No LAD in supraclavicular area     []  No LAD in axillae   []  No LAD in epitrochlear chains     []  No LAD in inguinal areas        CV []  RRR            [x]  No peripheral edema     [x]  Pedal pulses intact   [x]  No abnormal heart sounds appreciated   [x]  Extremities WWP   Regular, borderline tachy      Resp []  Normal WOB at rest    []  No breathlessness with speaking, no coughing  []  CTA anteriorly    []  CTA posteriorly    Ventilated; coarse breath sounds bilaterally      GI [x]  Normal inspection, NTND   [x]  NABS     [x]  No umbilical hernia on exam       []  No hepatosplenomegaly     []  Inspection of perineal and perianal areas normal        GU [x]  Normal external genitalia     [] No urinary catheter present in urethra   []  No CVA tenderness    []  No tenderness over renal allograft  Foley present      MSK [x]  No clubbing or cyanosis of hands       []  No vertebral point tenderness  [x]  No focal tenderness or abnormalities on palpation of joints in RUE, LUE, RLE, or LLE        Skin [x]  No rashes, lesions, or ulcers of visualized skin     [x]  Skin  warm and dry to palpation   Bullae on back not visualized; L anterior chest pressure ulcer covered      Neuro [x]  Face expression symmetric  []  Sensation to light touch grossly intact throughout    []  Moves extremities equally    [x]  No tremor noted        []  CNs II-XII grossly intact     []  DTRs normal and symmetric throughout []  Gait unremarkable  Intubated, on Precedex, unable to assess      Psych []  Appropriate affect       []  Fluent speech         []  Attentive, good eye contact  []  Oriented to person, place, time          []  Judgment and insight are appropriate   Intubated, on Precedex, unable to assess        Data for Medical Decision Making     Recent Labs   Lab Units 03/15/21  0348 03/11/21  1346 03/11/21  1102   WBC 10*9/L 18.6*   < >  --    HEMOGLOBIN g/dL 9.0*   < >  --    HEMOGLOBIN BG   --    < >  --    PLATELET COUNT (1) 10*9/L 239   < >  --    NEUTRO ABS 10*9/L 16.5*   < >  --    LYMPHO ABS 10*9/L 1.0*   < >  --    EOSINO ABS 10*9/L 0.1   < >  --    SODIUM WHOLE BLOOD   --    < >  --    SODIUM mmol/L 142   < >  --    POTASSIUM WHOLE BLOOD   --    < >  --    POTASSIUM mmol/L 3.4   < >  --    BUN mg/dL 51*   < >  --    CREATININE mg/dL 9.56*   < >  --    GLUCOSE mg/dL 213   < >  --    CALCIUM mg/dL 8.7   < >  --    MAGNESIUM mg/dL 1.7   < >  --    PHOSPHORUS mg/dL 3.9   < >  --    BILIRUBIN TOTAL mg/dL 0.7   < >  --    AST U/L 40*   < >  --    ALT U/L 21   < >  --    IGG mg/dL  --   --  086*    < > = values in this interval not displayed.     I reviewed and noted the following labs: downtrending leukocytosis 18.6 . Anemia with Hgb 9.0. Cr elevated 2.30, decreasing. AST/ALT 37/19.    Microbiology:  Past cultures were reviewed in Epic and CareEverywhere.  Microbiology Results (last day)     Procedure Component Value Date/Time Date/Time    Blood Culture #1 [[id number]]  (Normal) Collected: 03/09/21 0458    Lab Status: Final result Specimen: Blood from 1 Peripheral Draw Updated: 03/14/21 0545     Blood Culture, Routine No Growth at 5 days    Blood Culture #2 [[id number]]  (Normal) Collected: 03/09/21 0458    Lab Status: Final result Specimen: Blood from 1 Peripheral Draw Updated: 03/14/21 0545     Blood Culture, Routine No Growth at 5 days    Lower Respiratory Culture [[id number]] Collected: 03/14/21 0254  Lab Status: Preliminary result Specimen: Sputum from Lung, Combined Updated: 03/14/21 0323     Gram Stain <10  Epithelial cells/LPF      >25 PMNS/LPF      1+ Gram positive cocci      Yeast present      Acceptable for culture    Narrative:      Specimen Source: Lung, Combined    Blood Culture, Adult [[id number]] Collected: 03/13/21 2339    Lab Status: In process Specimen: Blood from 1 Peripheral Draw Updated: 03/13/21 2344    Blood Culture, Adult [[id number]] Collected: 03/13/21 2339    Lab Status: In process Specimen: Blood from 1 Peripheral Draw Updated: 03/13/21 2344        Imaging:  No results found.  XR Chest 12/7  Similar patchy and confluent consolidation in the right lung, greatest in the mid zone. Persistent left lower lung consolidation.   Small right pleural effusion. No pneumothorax.  Stable cardiomediastinal silhouette.     XR Chest 12/5 : Interval increase in right hemithorax consolidation. Persistent retrocardiac consolidation.No pleural effusion or pneumothorax    XR Chest 12/4: Slight increase in nonspecific left basilar opacities. Patchy and confluent heterogeneous opacities in the right upper lung are similar.      XR Chest Portable 03/06/2021  Marked interval worsening of airspace consolidation in the right upper and midlung zones. The left lung is clear.  No pleural effusion or pneumothorax.  Stable cardiomediastinal silhouette.    I have reviewed the images from 03/10/21 and agree with the findings.    Additional Studies:   (12/10) EKG QTc 427

## 2021-03-15 NOTE — Unmapped (Signed)
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 *    Interval history: Adam [name] with PMHx of ALL, COPD, prior MRSA bacteremia requiring intubation, MDD, lower back pain, tobacco use hx, presenting with??nausea, vomiting, fever initially found to be hypotensive and tachycardic at oncologists office, started on linezolid.??SOB worsened??in subsequent 24hrs??prompting presentation to the ED.??Admitted to the MICU due to c/f??acute hypoxic respiratory failure secondary to influenza infection with superimposed PNA.??    24 hr inter: Patient continues to fever. Patient FiO2 requirement stable at 50% FiO2, PEEP increased to 12. ECHO pending, will obtain CT chest non contrast to help drive ID and pulmonary management. Sedation increased given patient's alertness. Echo was negative for vegitations    Neurological   Sedation   Intubated secondary to worsening hypoxia, s/p vecuronium x1. Continuing to wean sedation. Patient became awake overnight, endorsed pain. Will increase analgesia and will continue sedation with ativan at same strength.     - Continue PRN dilaudid 2q 2hrs  -Continue PRN ativan 2 mg q4h  - Increase scheduled Ativan 2 q4hrs  - increase schedule oxycodone to 15 q4hrs  - Continue gabapentin 100 q8hrs (dose reduced for renal function)    Chronic??Pain??- Peripheral neuropathy  Has chronic pain at baseline from??ALL. Follows with [facility].   - See above    MDD  Holding home clonazapam and nortriptyline   ??  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 Stable.     Pulmonary   AHRF 2/2 Influenza w/ superimposed MRSA PNA - s/p Intubation  S/p intubation 12/5 overnight. Patient previously proned, now transferred to supine. Patient FiO2 continues to decrease to 50% from 70% overnight. P/F ratio less than 150 currently, will assess at 16 hours. c/f ARDS given acute onset, bilateral infiltrates, low P/F continues to increase, no pulm edema. Patient continue to be on FiO2 40%. Will continue to ween O2 as tolerated. Patient making slow progress most likely 2/2 to loculated effusions.   -PRVC  - Continue Abx as indicated below   - Droplet Precautions??  - Follow serial ABGs to ensure appropriately compensating for acidosis (q6h)  - Consider CT chest w/o contrast if acute worsening of respiratory status, but otherwise imaging would not change current management.  ??  COPD  20 ppy tobacco use hx, followed by Pulmonology outpt. Per 01/25/21 no prior exacerbations. Utilizes home albuterol.   - O2 Goal 88-92%  - cont ANORO Ellipta  - Holding on steroids  - ABx as listed below  - prn duonebs  ??  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 Underwent unsuccessful thoracentesis, at the time referred to CT surgery, at the time didn't  think warranted surgical intervention. POCUS attempted yesterday, did not show any dense consolidations. Obtain Echo to assess for vegetations.   -TTE negative for vegetations.   -Possible CT chest w/o contrast today    Cardiovascular   NAI    Renal   AKI  Likely prerenal vs ATN. Has not resolved with increasing amounts of fluids so ATN more likely. Stable UOP. Creatinine downtrending  - Consult nephrology, appreciate recommendations   -CTM    Infectious Disease/Autoimmune   Influenza c/b MRSA PNA - Hx of MRSA Bacteremia  Leukocytosis with neutrophil predominance, febrile, myalgias. CXR reveals??alveolar process covering the entire R side, with signifcant progression from 12/2 CXR. Empirically given Linezolid/Aztreonam in the ED. 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 See infectious disease note by Dr. Cardell Peach from 03/06/21 for list of prior infections & notable antimicrobial intolerance/allergy list. MRSA screen positive, though BCx remain negative. Will obtain CT chest w/o contrast today to visualize/drive treatment of loculated effusions.  - Infectious disease Immunocomp consulted, follow reccs.  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  History of DRESS syndrome??2/2 cephalosporin  - consulted ICID for reccs re: abx regimen  - Avoid beta lactams    Cultures:  Blood Culture, Routine (no units)   Date Value   03/13/2021 No Growth at 24 hours   03/13/2021 No Growth at 24 hours     Urine Culture, Comprehensive (no units)   Date Value   03/06/2021 NO GROWTH     Lower Respiratory Culture (no units)   Date Value   03/14/2021 2+ Staphylococcus aureus (A)   03/14/2021 Remainder of Culture in Progress     WBC (10*9/L)   Date Value   03/15/2021 18.6 (H)     WBC, UA (/HPF)   Date Value   03/14/2021 4 (H)          FEN/GI   Constipation-Possible residuals from tube feeds  Improved today. Having multiple BMs. Will decrease bowel regimen  -Stop Lactulose 20g daily  -restart Reglan 5 mg IV TID  -Stop senna 2 tablets BID      Malnutrition Assessment: Not done yet.  Body mass index is 32.2 kg/m??.  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 ??He subsequently had difficulty tolerating single agent dasatinib. S/p 4 cycles of ponatinib-blinatumomab.??He is now MRD-negative and on ponatinib monotherapy  - heme on board: Recc holding home TKI until malignant hematology evals for ready to resume. Patient's family will need update to bring in home supply then.    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 Discussed with heme will obtain repeat IgG next week.  DVT PPx  - subcutaneous heparin 5000 units q 8hr    Endocrine   NAI    Integumentary   Skin Rash over anterior abdomen   Pictures in media tab. Suspect due to injury during proning. Stable  - Continue to monitor  - Wound care consult    - WOCN consulted for high risk skin assessment Yes.  - cont pressure mitigating precautions per skin policy    Prophylaxis/LDA/Restraints/Consults   Can CVC be removed? N/A, no CVC present (including vascular catheter for HD or PLEX)   Can A-line be removed? No: frequent ABGs  Can Foley be removed? No: Need continuous I/O  Mobility plan: Step 1 - Range of motion    Feeding: Tube feeds at goal  Analgesia: Pain adequately controlled, weaning  Sedation SAT/SBT: N/A  Thromboembolic ppx: SQ heparin  Head of bed >30 degrees: Yes  Ulcer ppx: Yes, mechanical ventilation > 3 days, not on tube feeds  Glucose within target range: Yes, in range    Does patient need/have an active type/screen? No    RASS at goal? Yes  [place] Agitation Assessment Scale (RASS) : 0 (03/15/2021  6:00 AM)     Can antipsychotics be stopped? N/A, not on antipsychotics  CAM-ICU Result: Negative (11/09/2020  9:00 PM)      Would hospice care be appropriate for this patient? No, patient improving or expected to improve  Any unaddressed hospice/palliative care needs? no    Patient Lines/Drains/Airways Status     Active Active Lines, Drains, & Airways     Name Placement date Placement time Site Days    ETT  7.5 03/07/21  2131  -- 7    NG/OG Tube Feedings Center mouth 03/08/21  0106  Center mouth  7    Urethral Catheter 03/07/21  2200  --  7    Peripheral IV 03/06/21 Left Arm 03/06/21  --  Arm  9    Peripheral IV 03/08/21 Right Antecubital 03/08/21  0102  Antecubital  7    Arterial Line 03/07/21 Right Radial 03/07/21  1500  Radial  7              Patient Lines/Drains/Airways Status     Active Wounds     Name Placement date Placement time Site Days    Wound 03/09/21 Pressure Injury Nose Mid Stage 2 03/09/21  1005  Nose  6    Wound 03/09/21 Skin Tear Chest Left 03/09/21  1030  Chest  6                Goals of Care     Code Status: Full Code    Designated Healthcare Decision Maker:  Mr. Keetch current decisional capacity for healthcare decision-making is Full capacity. His designated Educational psychologist) is/are   HCDM (patient stated preference): Rilen, Shukla - Spouse - [phone].      Subjective     See 24 hr int    Objective     Vitals - past 24 hours  Temp:  [37.9 ??C (100.2 ??F)-38.7 ??C (101.7 ??F)] 38.4 ??C (101.1 ??F)  Heart Rate:  [96-114] 98  SpO2 Pulse:  [96-114] 98  Resp:  [14-29] 28  FiO2 (%):  [50 %] 50 %  SpO2:  [91 %-97 %] 93 % Intake/Output  I/O last 3 completed shifts:  In: 2407.6 [I.V.:127.6; NG/GT:1380; IV Piggyback:900]  Out: 4480 [Urine:4480]     Physical Exam:    General:??Appears comfortable, sedated in NAD   HEENT:??NCAT, reports no sinus  tenderness. Moist mucus membranes.  CV:??sinus tachycardia, no m/r/g. No clubbing  Pulm:??Ventilator breath sounds, 40% FiO2  GI:??Abdomen distended, soft, non-tender. Bowel sounds present.  MSK:??No rigidity. Diffuse myalgia. No LE swelling/size discrepancy.  Skin:??Unchanged Abrasion to left anterior chest wall, no crepitus, linear erythema on anterior abdomen  Neuro:??Sedated, not able to follow commands or track.      Continuous Infusions:   ??? sodium chloride         Scheduled Medications:   ??? alteplase  1 mg Intravenous Once   ??? carboxymethylcellulose sodium  2 drop Both Eyes Q6H SCH   ??? chlorhexidine  15 mL Topical BID   ??? famotidine  20 mg Enteral tube: gastric  At bedtime   ??? gabapentin  100 mg Enteral tube: gastric  [facility]   ??? heparin (porcine) for subcutaneous use  5,000 Units Subcutaneous Q8H [facility]   ??? flu vacc qs2022-23 6mos up(PF)  0.5 mL Intramuscular During hospitalization   ??? ipratropium-albuteroL  3 mL Nebulization Q6H (RT)   ??? linezolid  600 mg Intravenous Q12H [facility]   ??? LORazepam  2 mg Intravenous Q4H [facility]   ??? melatonin  3 mg Enteral tube: gastric  QPM   ??? metoclopramide  5 mg Intravenous Q6H [facility]   ??? oxyCODONE  15 mg Enteral tube: gastric  Q4H [facility]   ??? potassium chloride  40 mEq Oral BID   ??? sulfamethoxazole-trimethoprim  1 tablet Oral 2 times per day on Sun Sat   ??? valACYclovir  500 mg Oral Daily       PRN medications:  acetaminophen, acetaminophen **AND** diphenhydrAMINE, albuterol, bisacodyL, Implement **AND** Care order/instruction **AND** Vital signs **AND** sodium chloride **AND** sodium chloride 0.9% **AND** diphenhydrAMINE **AND** famotidine **AND** meperidine **AND** methylPREDNISolone sodium succinate (PF) **AND** EPINEPHrine, haloperidol lactate, HYDROmorphone, LORazepam, ondansetron    Data/Imaging Review: Reviewed in Epic and personally interpreted on 03/15/2021. See EMR for detailed results.

## 2021-03-15 NOTE — Unmapped (Signed)
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 *        Cross cover summary       Patient continues to be febrile, noted to have high volume stools per nurse, stopped senna (progress note said to stop). Physical exam concerning for distended abdomen worsened from day prior, bowel sounds absent. Kept tube feeds off.     @2000  Incr PEEP to 12. PaO2 remains about 70 following change.     Wife at bedside present inquiring about why received lasix during the day time. Provider stated she was not sure and would relay message to the day team.       Juanda Chance, MD

## 2021-03-15 NOTE — Unmapped (Signed)
Respiratory Safety Check    Date: 03/14/2021     PPV Device at bedside:   [x]  Yes  []  Replaced  []  N/A    PPV mask at bedside:  [x]  Yes  []  Replaced  []  N/A    Checked humidifier, inspiratory limb, and expiratory limb:   [x]  Yes  []  Replaced  []  N/A    Backup trachs and supplies available at bedside:  []  Yes  []  Replaced  [x]  N/A    Documented trach airway form visible at bedside:   []  Yes  []  Replaced  [x]  N/A    iNO connected with PPV device:  []  Yes  []  Replaced  [x]  N/A

## 2021-03-15 NOTE — Unmapped (Signed)
Malignant Hematology Consult Note    Requesting Attending Physician :  [name], MD  Service Requesting Consult : Medical ICU (MDI)  Reason for Consult: B-ALL  Primary Oncologist: Dr Senaida Ores    Assessment: Adam Keith is a 40 y.o. 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 Malignant hematology was consulted for evaluation of whether to continue TKI for B-ALL.     #Ph(+) B-ALL  Oncology history:  S/p induction per GRAAPH-2005. Course complicated by septic shock, candida krusei fungemia, MRSA bacteremia with septic emboli c/b acute renal failure and respiratory distress requiring dialysis and intubation. ??Post-induction bmbx (day 29) demonstrated flow-based MRD-negative remission but persistent low-level BCR-ABL. ??Subsequently had difficulty tolerating single agent dasatinib (as a bridge to planned ponatinib-blinatumomab--had fluid retention and pleural effusion). ??He??then completed????4 cycles??of ponatinib-blinatumomab, initiated for treatment of MRD in the setting of poor tolerance of standard therapy.??He is now MRD-negative by flow with BCR-ABL level of 0.002% on marrow by PCR (01/11/21). Patient is now on ponatinib 30mg  monotherapy (since 01/2021).  Plan:  - Holding ponatinib until more stable     #Acute hypoxemic respiratory failure 2/2 MRSA pneumonia - Influenza A - Hypogammaglobulinemia   Respiratory status has stabilized but remains critical, currently intubated on FiO2 50%, PEEP 12. Persistent fevers, leukocytosis concerning for lack of source control.   - On linezolid per ICID (12/3-). S/p oseltamivir (12/3-12/7)   - s/p IVIG (12/7)   - CT C/A/P ordered by primary team   - Bactrim and valtrex prophylaxis    Recommendations:   - Appreciate excellent care of MICU and consultation from ICID.  - Follow-up CT C/A/P   - Continue to hold ponatinib     This patient has been staffed with Dr. Barbette Merino. These recommendations were discussed with the primary team.     Please contact the malignant hematology fellow at [phone] with any further questions.    Adam Form, MD  Hematology-Oncology Fellow    -------------------------------------------------------------    HPI: Adam Keith is a 40 y.o. 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 Malignant hematology was consulted for evaluation of whether to continue TKI for B-ALL.     Seen in ID clinic 12/2 with fever/cough, positive for flu A, CXR concerning for pneumonia, sent home on linezolid. Re-presented to ED on 12/3 with worsening symptoms, including hypotension, hypoxia. WBC 20.6. Admitted to MICU. Overnight had improvement in lactate with IVFs (normalized, initially 4.1 on admission).  Febrile to 38.6 overnight.  Early this morning ~5am, had desaturation to 80% and required 100% FiO2 briefly, was able to be weaned to 75% FiO2 when our team saw him in MICU ~9:30 am.  6AM ABG 7.36/pO2 96/PCO2 35.    Mental status improved off sedation, able to fist bump. Continues to require significant respiratory support and continues to be febrile with leukocytosis concerning for inadequate source control.     Review of Systems: All positive and pertinent negatives are noted in the HPI; otherwise all other systems are negative    Oncologic History:  Oncology History Overview Note   Referring/Local Oncologist: None    Diagnosis:Ph+ ALL    Genetics:    Karyotype/FISH:Abnormal Karyotype: 46,XY,t(9;22)(q34;q11.2)[1]/45,XY,der(7;9)(q10;q10)t(9;22)(q34;q11.2),der(22)t(9;22)[11]/46,sdl,+der(22)t(9;22)[5]/46,XY[3]     Abnormal FISH:  A BCR/ABL1 interphase FISH assay shows an abnormal signal pattern in 97% of the 100 cells scored. 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  The findings support a diagnosis of leukemia and have implications for targeted therapy and for monitoring residual disease.      Molecular Genetics:  BCR-ABL1 p210 transcripts were detected at a level of 46.479 IS% ratio in bone marrow.  BCR-ABL1 p190 transcripts were detected at a level of 4 in 100,000 cells in bone marrow.    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)     01/24/2020 - 04/02/2020 Chemotherapy    IP/OP LEUKEMIA GRAAPH-2005 + RITUXIMAB < 60 YO  rituximab hypercvad (odd and even course)     02/24/2020 Remission    CR with PCR MRD+; marrow showing 70% blasts with <1% blasts, negative flow MRD, BCR-ABL p210 PCR 0.197%; CNS negative for blasts     03/09/2020 Progression    CSF - rare blast ID'ed - IT chemo given    03/13/20 - IT chemo, CSF negative     04/16/2020 Biopsy    BM Bx with 40-50% cellularity, <1% blasts, MRD flow cytometry negative, BCR-ABL p210 transcripts 0.036%     05/28/2020 - 05/28/2020 Chemotherapy    OP AML - CNS THERAPY (INTRATHECAL CYTARABINE, INTRATHECAL METHOTREXATE, OR INTRATHECAL TRIPLE)  Select one of the following: cytarabine IT 100 mg with hydrocortisone 50 mg, cytarabine IT 40 mg with methotrexate 15 mg with hydrocortisone 50 mg, OR methotrexate IT 12 mg with hydrocortisone 50 mg     06/19/2020 -  Chemotherapy    IP/OP LEUKEMIA BLINATUMOMAB 7-DAY INFUSION (MINIMAL RESIDUAL DISEASE; WT >= 22 KG) (HOME INFUSION)      Cycles 1*-4: Blinatumomab 28 mcg/day Days 1-28 of 6-week cycle.  *Given in the inpatient setting on Days 1-3 on Cycle 1 and Days 1-2 on Cycle 2, while other treatment days are given in the outpatient setting.    Cycle 1 MRD Dosing     07/18/2020 Adverse Reaction    Hospitalization: fevers. Pneumonia     07/23/2020 Biopsy    Diagnosis  Bone marrow, right iliac, aspiration and biopsy  -   Normocellular bone marrow (50%) with trilineage hematopoiesis and 1% blasts by manual aspirate differential  -   Flow cytometry MRD analysis reveals no definitive immunophenotypic evidence of residual B lymphoblastic leukemia   - BCR-ABL p210 0.006%         08/10/2020 -  Chemotherapy    Cycle 2 blinatumomab-ponatinib  Ponatinib 30mg     1 IT per cycle     09/21/2020 Adverse Reaction    Covid-19, symptomatic but not requiring hospitalization.     10/06/2020 -  Chemotherapy    Cycle 3 blinatumomab-ponatinib  Ponatinib 30mg        11/01/2020 Adverse Reaction    Hospitalization: MRSA Pneumonia requiring intubation    Blinatumomab stopped.  Ponatinib held until he stabilized.       12/14/2020 -  Chemotherapy    Cycle 4 blinatumomab 28 mcg/day days 1-28, ponatinib 30 mg per day IT triple therapy day 29     ALL (acute lymphoblastic leukemia) (CMS-HCC)   06/11/2020 -  Chemotherapy  IP/OP LEUKEMIA BLINATUMOMAB 7-DAY INFUSION (MINIMAL RESIDUAL DISEASE; WT >= 22 KG) (HOME INFUSION)  Cycles 1*-4: Blinatumomab 28 mcg/day Days 1-28 of 6-week cycle.  *Given in the inpatient setting on Days 1-3 on Cycle 1 and Days 1-2 on Cycle 2, while other treatment days are given in the outpatient setting.     09/21/2020 Initial Diagnosis    ALL (acute lymphoblastic leukemia) (CMS-HCC)         Past Medical History:   Diagnosis Date   ??? 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 BONE MARROW BIOPSY & ASPIRATION  01/21/2020        ??? CHG [place], CHEST,REAL TIME  11/20/2020    Procedure: ULTRASOUND, CHEST, REAL TIME WITH IMAGE DOCUMENTATION;  Surgeon: Jerelyn Charles, MD;  Location: [facility] PROC[facility]; Service: Pulmonary   ??? IR INSERT PORT AGE GREATER THAN 5 YRS  03/10/2020    IR INSERT PORT AGE GREATER THAN 5 YRS 03/10/2020 [name], MD IMG VIR [facility]   ??? PR BRONCHOSCOPY,DIAGNOSTIC W LAVAGE Bilateral 07/20/2020    Procedure: BRONCHOSCOPY, RIGID OR FLEXIBLE, INCLUDE FLUOROSCOPIC GUIDANCE WHEN PERFORMED; W/BRONCHIAL ALVEOLAR LAVAGE WITH MODERATE SEDATION;  Surgeon: Dellis Filbert, MD;  Location: [facility] PROCED[facility];  Service: Pulmonary       Family History   Problem Relation Age of Onset   ??? Melanoma Neg Hx    ??? Basal cell carcinoma Neg Hx    ??? Squamous cell carcinoma Neg Hx         Social History     Socioeconomic History   ??? Marital status: Married   Tobacco Use   ??? Smoking status: Former     Packs/day: 2.00     Types: Cigarettes     Quit date: 01/20/2020     Years since quitting: 1.1   ??? Smokeless tobacco: Former     Types: Financial planner   ??? Vaping Use: Never used   Other Topics Concern   ??? Do you use sunscreen? No   ??? Tanning bed use? No   ??? Are you easily burned? Yes   ??? Excessive sun exposure? No   ??? Blistering sunburns? Yes     Social Determinants of Health     Financial Resource Strain: Low Risk    ??? Difficulty of Paying Living Expenses: Not very hard   Food Insecurity: No Food Insecurity   ??? Worried About Running Out of Food in the Last Year: Never true   ??? Ran Out of Food in the Last Year: Never true   Transportation Needs: No Transportation Needs   ??? Lack of Transportation (Medical): No   ??? Lack of Transportation (Non-Medical): No       Social History     Social History Narrative   ??? Not on file       Allergies: is allergic to bupropion hcl, cefepime, ceftaroline fosamil, dapsone, onion, vancomycin analogues, bismuth subsalicylate, and furosemide.    Medications:   Meds:  ??? alteplase  1 mg Intravenous Once   ??? carboxymethylcellulose sodium  2 drop Both Eyes Q6H [facility]   ??? chlorhexidine  15 mL Topical BID   ??? famotidine  20 mg Enteral tube: gastric  At bedtime   ??? gabapentin  100 mg Enteral tube: gastric  Q8H [facility]   ??? heparin (porcine) for subcutaneous use  5,000  Units Subcutaneous Q8H [facility]   ??? flu vacc qs2022-23 6mos up(PF)  0.5 mL Intramuscular During hospitalization   ??? ipratropium-albuteroL  3 mL Nebulization Q6H (RT)   ??? linezolid  600 mg Intravenous Q12H [facility]   ??? LORazepam  2 mg Intravenous Q4H [facility]   ??? melatonin  3 mg Enteral tube: gastric  QPM   ??? metoclopramide  5 mg Intravenous Q6H [facility]   ??? oxyCODONE  15 mg Enteral tube: gastric  Q4H SCH   ??? potassium chloride  40 mEq Oral BID   ??? sulfamethoxazole-trimethoprim  1 tablet Oral 2 times per day on Sun Sat   ??? valACYclovir  500 mg Oral Daily     Continuous Infusions:  ??? sodium chloride       PRN Meds:.acetaminophen, acetaminophen **AND** diphenhydrAMINE, albuterol, bisacodyL, Implement **AND** Care order/instruction **AND** Vital signs **AND** sodium chloride **AND** sodium chloride 0.9% **AND** diphenhydrAMINE **AND** famotidine **AND** meperidine **AND** methylPREDNISolone sodium succinate (PF) **AND** EPINEPHrine, haloperidol lactate, HYDROmorphone, LORazepam, ondansetron    Objective:   Vitals: Temp:  [37.9 ??C (100.2 ??F)-38.7 ??C (101.7 ??F)] 38.4 ??C (101.1 ??F)  Heart Rate:  [96-114] 98  SpO2 Pulse:  [96-114] 98  Resp:  [14-29] 28  A BP-2: (108-176)/(58-87) 133/73  MAP:  [73 mmHg-108 mmHg] 91 mmHg  FiO2 (%):  [50 %] 50 %  SpO2:  [91 %-97 %] 93 %    Physical Exam:  BP 124/65  - Pulse 98  - Temp (!) 38.4 ??C (101.1 ??F) (Axillary)  - Resp 28  - Ht 185 cm (6' 0.84)  - Wt (!) 110.2 kg (242 lb 15.2 oz)  - SpO2 93%  - BMI 32.20 kg/m??    General appearance - Nontoxic appearing man, intubated  Mental status - alert, awakens to voice.   Eyes - pupils equal and reactive, extraocular eye movements intact   Nose - normal and patent, no erythema, discharge or polyps   Mouth - ETT in place.   Neck - supple   Lymphatics - not examined  Pulmonary - Intubated. Diffuse crackles.   Cardiovascular- Sinus tachycardia   Gastrointestinal - Obese abdomen, distended, nontender.  Neurological - alert, oriented, normal speech, no focal findings or movement disorder noted   Musculoskeletal - no joint tenderness, deformity or swelling   Extremities - peripheral pulses normal, no pedal edema, no clubbing or cyanosis   Skin - normal coloration and turgor, no rashes, no suspicious skin lesions noted       Test Results  Recent Labs     03/13/21  0349 03/14/21  0315 03/15/21  0348   WBC 17.1* 22.1* 18.6*   NEUTROABS 14.7* 19.9* 16.5*   HGB 9.0* 9.4* 9.0*   PLT 266 262 239       Imaging: Radiology studies were personally reviewed - Large consolidation of RUL, worse compared to 12/3.

## 2021-03-16 LAB — HEPATIC FUNCTION PANEL
ALBUMIN: 2.1 g/dL — ABNORMAL LOW (ref 3.4–5.0)
ALKALINE PHOSPHATASE: 220 U/L — ABNORMAL HIGH (ref 46–116)
ALT (SGPT): 16 U/L (ref 10–49)
AST (SGOT): 30 U/L (ref <=34)
BILIRUBIN DIRECT: 0.5 mg/dL — ABNORMAL HIGH (ref 0.00–0.30)
BILIRUBIN TOTAL: 0.7 mg/dL (ref 0.3–1.2)
PROTEIN TOTAL: 6.5 g/dL (ref 5.7–8.2)

## 2021-03-16 LAB — CBC W/ AUTO DIFF
BASOPHILS ABSOLUTE COUNT: 0.1 10*9/L (ref 0.0–0.1)
BASOPHILS RELATIVE PERCENT: 0.8 %
EOSINOPHILS ABSOLUTE COUNT: 0.1 10*9/L (ref 0.0–0.5)
EOSINOPHILS RELATIVE PERCENT: 0.7 %
HEMATOCRIT: 28.4 % — ABNORMAL LOW (ref 39.0–48.0)
HEMOGLOBIN: 9.2 g/dL — ABNORMAL LOW (ref 12.9–16.5)
LYMPHOCYTES ABSOLUTE COUNT: 1.1 10*9/L (ref 1.1–3.6)
LYMPHOCYTES RELATIVE PERCENT: 6.1 %
MEAN CORPUSCULAR HEMOGLOBIN CONC: 32.6 g/dL (ref 32.0–36.0)
MEAN CORPUSCULAR HEMOGLOBIN: 28.2 pg (ref 25.9–32.4)
MEAN CORPUSCULAR VOLUME: 86.6 fL (ref 77.6–95.7)
MEAN PLATELET VOLUME: 8.9 fL (ref 6.8–10.7)
MONOCYTES ABSOLUTE COUNT: 0.9 10*9/L — ABNORMAL HIGH (ref 0.3–0.8)
MONOCYTES RELATIVE PERCENT: 5.1 %
NEUTROPHILS ABSOLUTE COUNT: 15.3 10*9/L — ABNORMAL HIGH (ref 1.8–7.8)
NEUTROPHILS RELATIVE PERCENT: 87.3 %
PLATELET COUNT: 281 10*9/L (ref 150–450)
RED BLOOD CELL COUNT: 3.28 10*12/L — ABNORMAL LOW (ref 4.26–5.60)
RED CELL DISTRIBUTION WIDTH: 17.2 % — ABNORMAL HIGH (ref 12.2–15.2)
WBC ADJUSTED: 17.5 10*9/L — ABNORMAL HIGH (ref 3.6–11.2)

## 2021-03-16 LAB — BASIC METABOLIC PANEL
ANION GAP: 10 mmol/L (ref 5–14)
ANION GAP: 10 mmol/L (ref 5–14)
BLOOD UREA NITROGEN: 42 mg/dL — ABNORMAL HIGH (ref 9–23)
BLOOD UREA NITROGEN: 41 mg/dL — ABNORMAL HIGH (ref 9–23)
BUN / CREAT RATIO: 20
BUN / CREAT RATIO: 20
CALCIUM: 8.7 mg/dL (ref 8.7–10.4)
CALCIUM: 8.7 mg/dL (ref 8.7–10.4)
CHLORIDE: 114 mmol/L — ABNORMAL HIGH (ref 98–107)
CHLORIDE: 115 mmol/L — ABNORMAL HIGH (ref 98–107)
CO2: 22 mmol/L (ref 20.0–31.0)
CO2: 21 mmol/L (ref 20.0–31.0)
CREATININE: 2.07 mg/dL — ABNORMAL HIGH
CREATININE: 2.02 mg/dL — ABNORMAL HIGH
EGFR CKD-EPI (2021) MALE: 41 mL/min/{1.73_m2} — ABNORMAL LOW (ref >=60)
EGFR CKD-EPI (2021) MALE: 42 mL/min/{1.73_m2} — ABNORMAL LOW (ref >=60)
GLUCOSE RANDOM: 106 mg/dL (ref 70–179)
GLUCOSE RANDOM: 99 mg/dL (ref 70–179)
POTASSIUM: 3.7 mmol/L (ref 3.4–4.8)
POTASSIUM: 4 mmol/L (ref 3.4–4.8)
SODIUM: 146 mmol/L — ABNORMAL HIGH (ref 135–145)
SODIUM: 146 mmol/L — ABNORMAL HIGH (ref 135–145)

## 2021-03-16 LAB — BLOOD GAS, ARTERIAL
BASE EXCESS ARTERIAL: -3.3 — ABNORMAL LOW (ref -2.0–2.0)
BASE EXCESS ARTERIAL: -2.8 — ABNORMAL LOW (ref -2.0–2.0)
BASE EXCESS ARTERIAL: -1.6 (ref -2.0–2.0)
BASE EXCESS ARTERIAL: -3.4 — ABNORMAL LOW (ref -2.0–2.0)
BASE EXCESS ARTERIAL: -3.4 — ABNORMAL LOW (ref -2.0–2.0)
FIO2 ARTERIAL: 30
HCO3 ARTERIAL: 21 mmol/L — ABNORMAL LOW (ref 22–27)
HCO3 ARTERIAL: 21 mmol/L — ABNORMAL LOW (ref 22–27)
HCO3 ARTERIAL: 22 mmol/L (ref 22–27)
HCO3 ARTERIAL: 21 mmol/L — ABNORMAL LOW (ref 22–27)
HCO3 ARTERIAL: 21 mmol/L — ABNORMAL LOW (ref 22–27)
O2 SATURATION ARTERIAL: 93.7 % — ABNORMAL LOW (ref 94.0–100.0)
O2 SATURATION ARTERIAL: 94.4 % (ref 94.0–100.0)
O2 SATURATION ARTERIAL: 98.9 % (ref 94.0–100.0)
O2 SATURATION ARTERIAL: 99.4 % (ref 94.0–100.0)
O2 SATURATION ARTERIAL: 89.8 % — ABNORMAL LOW (ref 94.0–100.0)
PCO2 ARTERIAL: 33 mmHg — ABNORMAL LOW (ref 35.0–45.0)
PCO2 ARTERIAL: 33 mmHg — ABNORMAL LOW (ref 35.0–45.0)
PCO2 ARTERIAL: 30.8 mmHg — ABNORMAL LOW (ref 35.0–45.0)
PCO2 ARTERIAL: 33.3 mmHg — ABNORMAL LOW (ref 35.0–45.0)
PCO2 ARTERIAL: 37.1 mmHg (ref 35.0–45.0)
PH ARTERIAL: 7.41 (ref 7.35–7.45)
PH ARTERIAL: 7.42 (ref 7.35–7.45)
PH ARTERIAL: 7.46 — ABNORMAL HIGH (ref 7.35–7.45)
PH ARTERIAL: 7.41 (ref 7.35–7.45)
PH ARTERIAL: 7.37 (ref 7.35–7.45)
PO2 ARTERIAL: 68.6 mmHg — ABNORMAL LOW (ref 80.0–110.0)
PO2 ARTERIAL: 74.8 mmHg — ABNORMAL LOW (ref 80.0–110.0)
PO2 ARTERIAL: 97.4 mmHg (ref 80.0–110.0)
PO2 ARTERIAL: 214 mmHg — ABNORMAL HIGH (ref 80.0–110.0)
PO2 ARTERIAL: 63.5 mmHg — ABNORMAL LOW (ref 80.0–110.0)

## 2021-03-16 LAB — MAGNESIUM
MAGNESIUM: 1.9 mg/dL (ref 1.6–2.6)
MAGNESIUM: 1.9 mg/dL (ref 1.6–2.6)

## 2021-03-16 LAB — IGG: GAMMAGLOBULIN; IGG: 632 mg/dL — ABNORMAL LOW (ref 646–2013)

## 2021-03-16 LAB — PHOSPHORUS: PHOSPHORUS: 3.4 mg/dL (ref 2.4–5.1)

## 2021-03-16 MED ADMIN — HYDROmorphone (PF) (DILAUDID) 1 mg/mL injection: INTRAVENOUS | @ 18:00:00 | Stop: 2021-03-16

## 2021-03-16 MED ADMIN — LORazepam (ATIVAN) injection 2 mg: 2 mg | INTRAVENOUS | @ 19:00:00

## 2021-03-16 MED ADMIN — fentaNYL (PF) (SUBLIMAZE) injection 100 mcg: 100 ug | INTRAVENOUS | @ 17:00:00 | Stop: 2021-03-16

## 2021-03-16 MED ADMIN — ipratropium-albuteroL (DUO-NEB) 0.5-2.5 mg/3 mL nebulizer solution 3 mL: 3 mL | RESPIRATORY_TRACT | @ 02:00:00

## 2021-03-16 MED ADMIN — gabapentin (NEURONTIN) oral solution: 100 mg | GASTROENTERAL | @ 02:00:00

## 2021-03-16 MED ADMIN — oxyCODONE (ROXICODONE) immediate release tablet 15 mg: 15 mg | GASTROENTERAL | @ 10:00:00 | Stop: 2021-03-24

## 2021-03-16 MED ADMIN — linezolid in dextrose 5% (ZYVOX) 600 mg/300 mL IVPB 600 mg: 600 mg | INTRAVENOUS | @ 14:00:00 | Stop: 2021-03-19

## 2021-03-16 MED ADMIN — melatonin tablet 3 mg: 3 mg | GASTROENTERAL | @ 22:00:00

## 2021-03-16 MED ADMIN — HYDROmorphone 1 mg/mL in 0.9% sodium chloride: 0-3 mg/h | INTRAVENOUS | @ 18:00:00 | Stop: 2021-03-30

## 2021-03-16 MED ADMIN — ipratropium-albuteroL (DUO-NEB) 0.5-2.5 mg/3 mL nebulizer solution 3 mL: 3 mL | RESPIRATORY_TRACT | @ 20:00:00

## 2021-03-16 MED ADMIN — HYDROmorphone (PF) (DILAUDID) injection 2 mg: 2 mg | INTRAVENOUS | @ 09:00:00 | Stop: 2021-03-21

## 2021-03-16 MED ADMIN — metoclopramide (REGLAN) injection 5 mg: 5 mg | INTRAVENOUS | @ 10:00:00 | Stop: 2021-03-16

## 2021-03-16 MED ADMIN — midazolam (VERSED) injection 1 mg: 1 mg | INTRAVENOUS | @ 17:00:00

## 2021-03-16 MED ADMIN — LORazepam (ATIVAN) injection 2 mg: 2 mg | INTRAVENOUS | @ 22:00:00

## 2021-03-16 MED ADMIN — valACYclovir (VALTREX) tablet 500 mg: 500 mg | ORAL | @ 14:00:00

## 2021-03-16 MED ADMIN — HYDROmorphone (PF) (DILAUDID) injection 2 mg: 2 mg | INTRAVENOUS | @ 01:00:00 | Stop: 2021-03-21

## 2021-03-16 MED ADMIN — oxyCODONE (ROXICODONE) immediate release tablet 15 mg: 15 mg | GASTROENTERAL | @ 02:00:00 | Stop: 2021-03-24

## 2021-03-16 MED ADMIN — ondansetron (ZOFRAN) injection 8 mg: 8 mg | INTRAVENOUS | @ 15:00:00

## 2021-03-16 MED ADMIN — LORazepam (ATIVAN) injection 2 mg: 2 mg | INTRAVENOUS | @ 02:00:00

## 2021-03-16 MED ADMIN — heparin (porcine) 5,000 unit/mL injection 5,000 Units: 5000 [IU] | SUBCUTANEOUS | @ 20:00:00

## 2021-03-16 MED ADMIN — carboxymethylcellulose sodium (THERATEARS) 0.25 % ophthalmic solution 2 drop: 2 [drp] | OPHTHALMIC | @ 10:00:00

## 2021-03-16 MED ADMIN — oxyCODONE (ROXICODONE) immediate release tablet 15 mg: 15 mg | GASTROENTERAL | @ 06:00:00 | Stop: 2021-03-24

## 2021-03-16 MED ADMIN — LORazepam (ATIVAN) injection 2 mg: 2 mg | INTRAVENOUS | @ 18:00:00

## 2021-03-16 MED ADMIN — potassium chloride oral solution: 40 meq | ORAL | @ 14:00:00 | Stop: 2021-03-16

## 2021-03-16 MED ADMIN — chlorhexidine (PERIDEX) 0.12 % solution 15 mL: 15 mL | TOPICAL | @ 02:00:00

## 2021-03-16 MED ADMIN — HYDROmorphone (PF) (DILAUDID) injection 2 mg: 2 mg | INTRAVENOUS | @ 05:00:00 | Stop: 2021-03-21

## 2021-03-16 MED ADMIN — ipratropium-albuteroL (DUO-NEB) 0.5-2.5 mg/3 mL nebulizer solution 3 mL: 3 mL | RESPIRATORY_TRACT | @ 09:00:00

## 2021-03-16 MED ADMIN — acetaminophen (TYLENOL) tablet 650 mg: 650 mg | ORAL | @ 22:00:00

## 2021-03-16 MED ADMIN — LORazepam (ATIVAN) injection 2 mg: 2 mg | INTRAVENOUS | @ 06:00:00

## 2021-03-16 MED ADMIN — oxyCODONE (ROXICODONE) immediate release tablet 15 mg: 15 mg | GASTROENTERAL | @ 22:00:00 | Stop: 2021-03-24

## 2021-03-16 MED ADMIN — heparin (porcine) 5,000 unit/mL injection 5,000 Units: 5000 [IU] | SUBCUTANEOUS | @ 02:00:00

## 2021-03-16 MED ADMIN — carboxymethylcellulose sodium (THERATEARS) 0.25 % ophthalmic solution 2 drop: 2 [drp] | OPHTHALMIC | @ 22:00:00

## 2021-03-16 MED ADMIN — ipratropium-albuteroL (DUO-NEB) 0.5-2.5 mg/3 mL nebulizer solution 3 mL: 3 mL | RESPIRATORY_TRACT | @ 15:00:00

## 2021-03-16 MED ADMIN — oxyCODONE (ROXICODONE) immediate release tablet 15 mg: 15 mg | GASTROENTERAL | @ 19:00:00 | Stop: 2021-03-24

## 2021-03-16 MED ADMIN — LORazepam (ATIVAN) injection 2 mg: 2 mg | INTRAVENOUS | @ 10:00:00

## 2021-03-16 MED ADMIN — HYDROmorphone (PF) (DILAUDID) injection 2 mg: 2 mg | INTRAVENOUS | @ 18:00:00 | Stop: 2021-03-21

## 2021-03-16 MED ADMIN — potassium chloride (KLOR-CON) packet 40 mEq: 40 meq | ORAL | @ 02:00:00 | Stop: 2021-03-15

## 2021-03-16 MED ADMIN — carboxymethylcellulose sodium (THERATEARS) 0.25 % ophthalmic solution 2 drop: 2 [drp] | OPHTHALMIC | @ 17:00:00

## 2021-03-16 MED ADMIN — gabapentin (NEURONTIN) oral solution: 100 mg | GASTROENTERAL | @ 10:00:00

## 2021-03-16 MED ADMIN — QUEtiapine (SEROquel) tablet 100 mg: 100 mg | GASTROENTERAL | @ 16:00:00 | Stop: 2021-03-16

## 2021-03-16 MED ADMIN — heparin (porcine) 5,000 unit/mL injection 5,000 Units: 5000 [IU] | SUBCUTANEOUS | @ 10:00:00

## 2021-03-16 MED ADMIN — gabapentin (NEURONTIN) oral solution: 100 mg | GASTROENTERAL | @ 20:00:00

## 2021-03-16 MED ADMIN — HYDROmorphone (PF) (DILAUDID) injection 2 mg: 2 mg | INTRAVENOUS | @ 14:00:00 | Stop: 2021-03-21

## 2021-03-16 MED ADMIN — linezolid in dextrose 5% (ZYVOX) 600 mg/300 mL IVPB 600 mg: 600 mg | INTRAVENOUS | @ 01:00:00 | Stop: 2021-03-19

## 2021-03-16 MED ADMIN — LORazepam (ATIVAN) injection 2 mg: 2 mg | INTRAVENOUS | @ 15:00:00

## 2021-03-16 MED ADMIN — HYDROmorphone (PF) (DILAUDID) injection 2 mg: 2 mg | INTRAVENOUS | @ 12:00:00 | Stop: 2021-03-21

## 2021-03-16 MED ADMIN — metoclopramide (REGLAN) injection 5 mg: 5 mg | INTRAVENOUS | @ 04:00:00

## 2021-03-16 MED ADMIN — chlorhexidine (PERIDEX) 0.12 % solution 15 mL: 15 mL | TOPICAL | @ 13:00:00

## 2021-03-16 MED ADMIN — metoclopramide (REGLAN) injection 5 mg: 5 mg | INTRAVENOUS | @ 22:00:00

## 2021-03-16 MED ADMIN — HYDROmorphone (PF) (DILAUDID) injection 2 mg: 2 mg | INTRAVENOUS | @ 18:00:00 | Stop: 2021-03-16

## 2021-03-16 MED ADMIN — aztreonam (AZACTAM) 1 g in sodium chloride 0.9 % (NS) 100 mL IVPB-connector bag: 1 g | INTRAVENOUS | @ 20:00:00 | Stop: 2021-03-23

## 2021-03-16 MED ADMIN — famotidine (PEPCID) tablet 20 mg: 20 mg | GASTROENTERAL | @ 02:00:00

## 2021-03-16 MED ADMIN — oxyCODONE (ROXICODONE) immediate release tablet 15 mg: 15 mg | GASTROENTERAL | @ 16:00:00 | Stop: 2021-03-24

## 2021-03-16 MED ADMIN — carboxymethylcellulose sodium (THERATEARS) 0.25 % ophthalmic solution 2 drop: 2 [drp] | OPHTHALMIC | @ 04:00:00

## 2021-03-16 NOTE — Unmapped (Signed)
See provation.    Despina Boan MD  Pulmonary and Critical Care Fellow

## 2021-03-16 NOTE — Unmapped (Signed)
ADULT SPECIALTY CARE TEAM  Transport Summary Note     Departing Unit: MICU Departure Time: 17:05   Unit Returned To: MICU Return Time: 17:29           Report received from primary nurse via SBARq. Patient prepared to transport to CT Scan via stretcher under ICU Transport Protocol. Vital signs during transport, see vital signs section of DocFlowsheet for further details. Patient is lethargic . O2 via ventilator @ 50 %. Patient tolerated procedure well. Contact precautions maintained throughout transport.     Returned to MICU, update and care given to primary nurse. See Doc Flowsheet for additional transport documentation.

## 2021-03-16 NOTE — Unmapped (Signed)
Brief Operative Note  (CSN: [id number])      Date of Surgery: 03/16/21    Pre-op Diagnosis: pneumonia from influenza A    Post-op Diagnosis: pneumonia from influenza A    Procedure(s):  Bronchoscopy with Bronchoalveolar Lavage     Performing Service:   MICU  Dr. Lisabeth Devoid  Dr. Layla Maw    Findings: diffuse inflamed airways with mucus proceeding from apical segment of RUL and into left mainstem bronchus. BAL performed with of NS into RUL, 30mL cloudy return. Washing peformed in LLL with 60mL and 20mL cloudy fluid out.     Anesthesia: midazolam and fentanyl via bedside nurse    Estimated Blood Loss: 0mL    Complications: None    Specimens: 30mL BAL and 20mL washing fluid sent to lab for analysis    Implants: n/a    Surgeon Notes: I was present and scrubbed for the entire procedure    Adam Keith   Date: 03/16/2021  Time: 12:14 PM

## 2021-03-16 NOTE — Unmapped (Signed)
Nephrology Consult Follow up Note    Requesting Attending Physician :  [name], MD  Service Requesting Consult : Medical ICU (MDI)    Reason for Consult: AKI    Interval history/subjective  Patient still intubated but does not require vasopressor requirements  Awake and able to follow simple commands.  Creatinine seems to have plateaued with robust urine output of 2 L in 24 hours    Assessment/Recommendations: 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  Nephrology following for  AKI 2/2 ATN    # Non-oliguric AKI on CKD  2/2 ATN - improving   - Baseline cr of 1.6-1.9 in recent months, peaked at 3.85 on 03/12/2021 and seems to have plateaued and slowly improving to current to low 2s.   -  Urine sediment previously done showed  many muddy brown casts and few scattered WBCs, consistent with ATN, likely from hypotension.   - maintain MAP > 65  - No acute indications for dialysis at this time  - Strict I/O's  - Avoid nephrotoxic drugs including but not limited to NSAIDs, contrasted studies, and fleets enemas   - Dose all meds for creatinine clearance   - Unless absolutely necessary, no MRIs with gadolinium given potential risk for NSF.     Thank you for this consult.  Nephrology will continue to follow along with the primary team.  Please page the renal fellow on call with questions/concerns.    Patient was seen and evaluated by Dr. Toni Arthurs who agrees with this assessment and plan.     Medications:   ??? alteplase  1 mg Intravenous Once   ??? aztreonam  1 g Intravenous Q8H   ??? carboxymethylcellulose sodium  2 drop Both Eyes Q6H [facility]   ??? chlorhexidine  15 mL Topical BID   ??? famotidine  20 mg Enteral tube: gastric  At bedtime   ??? gabapentin  100 mg Enteral tube: gastric  [facility]   ??? heparin (porcine) for subcutaneous use 5,000 Units Subcutaneous Q8H [facility]   ??? flu vacc qs2022-23 6mos up(PF)  0.5 mL Intramuscular During hospitalization   ??? ipratropium-albuteroL  3 mL Nebulization Q6H (RT)   ??? linezolid  600 mg Intravenous Q12H [facility]   ??? LORazepam  2 mg Intravenous Q4H [facility]   ??? melatonin  3 mg Enteral tube: gastric  QPM   ??? metoclopramide  5 mg Intravenous Q6H [facility]   ??? oxyCODONE  15 mg Enteral tube: gastric  Q4H [facility]   ??? QUEtiapine  100 mg Enteral tube: gastric  BID   ??? sulfamethoxazole-trimethoprim  1 tablet Oral 2 times per day on Sun Sat   ??? valACYclovir  500 mg Oral Daily        ALLERGIES  Bupropion hcl, Cefepime, Ceftaroline fosamil, Dapsone, Onion, Vancomycin analogues, Bismuth subsalicylate, and Furosemide    MEDICAL HISTORY  Past Medical History:   Diagnosis Date   ??? 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 Marital status: Married     Spouse name: None   ??? Number of children: None   ??? Years of education: None   ??? Highest education level: None   Tobacco  Use   ??? Smoking status: Former     Packs/day: 2.00     Types: Cigarettes     Quit date: 01/20/2020     Years since quitting: 1.1   ??? Smokeless tobacco: Former     Types: Financial planner   ??? Vaping Use: Never used   Other Topics Concern   ??? Do you use sunscreen? No   ??? Tanning bed use? No   ??? Are you easily burned? Yes   ??? Excessive sun exposure? No   ??? Blistering sunburns? Yes     Social Determinants of Health     Financial Resource Strain: Low Risk    ??? Difficulty of Paying Living Expenses: Not very hard   Food Insecurity: No Food Insecurity   ??? Worried About Running Out of Food in the Last Year: Never true   ??? Ran Out of Food in the Last Year: Never true   Transportation Needs: No Transportation Needs   ??? Lack of Transportation (Medical): No   ??? Lack of Transportation (Non-Medical): No        FAMILY HISTORY  Family History   Problem Relation Age of Onset   ??? Melanoma Neg Hx    ??? Basal cell carcinoma Neg Hx    ??? Squamous cell carcinoma Neg Hx         Code Status:  Full Code    Review of Systems:  Review of systems was unobtainable due to patient factors. intubated      Physical Exam:  Vitals:    03/16/21 1212   BP:    Pulse: 125   Resp: (!) 37   Temp:    SpO2: 99%     I/O this shift:  In: 20 [NG/GT:20]  Out: 300 [Urine:300]    Intake/Output Summary (Last 24 hours) at 03/16/2021 1316  Last data filed at 03/16/2021 0800  Gross per 24 hour   Intake 890 ml   Output 1840 ml   Net -950 ml       CONSTITUTIONAL: intubated, awake  CARDIOVASCULAR: Regular, normal S1/S2 heart sounds, no murmurs, no rubs.   PULM: Clear to auscultation bilaterally with ventilator sounds  GASTROINTESTINAL: Soft, active bowel sounds, nontender  EXTREMITIES: No lower extremity edema bilaterally.   SKIN: scab on Left chest covered in foam dressing.  NEUROLOGIC: able to follow simple commands    LABS    Creatinine Trend  Lab Results   Component Value Date    Creatinine 2.07 (H) 03/16/2021    Creatinine 2.25 (H) 03/15/2021    Creatinine 2.30 (H) 03/15/2021    Creatinine 2.14 (H) 03/14/2021    Creatinine 2.36 (H) 03/14/2021    Creatinine 2.75 (H) 03/13/2021    Creatinine 3.13 (H) 03/13/2021    Creatinine 3.46 (H) 03/12/2021    Creatinine 3.85 (H) 03/12/2021    Creatinine 3.72 (H) 03/11/2021    Creatinine 3.40 (H) 03/11/2021    Creatinine 2.88 (H) 03/10/2021    Creatinine 2.79 (H) 03/10/2021    Creatinine 2.45 (H) 03/09/2021    Creatinine 2.64 (H) 03/09/2021    Creatinine 2.69 (H) 03/08/2021    Creatinine 2.44 (H) 03/08/2021    Creatinine 2.21 (H) 03/07/2021    Creatinine 2.50 (H) 03/07/2021    Creatinine 2.52 (H) 03/06/2021        Lab Results   Component Value Date    Color, UA Light Yellow 03/14/2021    Specific Gravity, UA 1.011 03/14/2021    pH, UA  5.5 03/14/2021    Protein, UA 30 mg/dL (A) 54/12/8117    Glucose, UA Negative 03/14/2021    Ketones, UA Negative 03/14/2021    Blood, UA Trace (A) 03/14/2021    Nitrite, UA Negative 03/14/2021 Leukocyte Esterase, UA Negative 03/14/2021    Urobilinogen, UA <2.0 mg/dL 14/78/2956    Bilirubin, UA Negative 03/14/2021       Lab Results   Component Value Date    Sodium, Ur <10 03/11/2021    Urea Nitrogen, Ur 637 03/11/2021    Creat U 131.2 03/11/2021    Protein/Creatinine Ratio, Urine 1.279 02/20/2020        Lab Results   Component Value Date    Sodium, Ur <10 03/11/2021    Sodium, Ur 10 11/02/2020    Sodium, Ur 53 02/09/2020        Lab Results   Component Value Date    Sodium 146 (H) 03/16/2021    Sodium Whole Blood 137 03/11/2021    Potassium 3.7 03/16/2021    Potassium, Bld 4.0 03/11/2021    Chloride 114 (H) 03/16/2021    CO2 22.0 03/16/2021    BUN 42 (H) 03/16/2021    Creatinine 2.07 (H) 03/16/2021     Lab Results   Component Value Date    Calcium 8.7 03/16/2021    Magnesium 1.9 03/16/2021    Phosphorus 3.4 03/16/2021    Albumin 2.1 (L) 03/16/2021        Lab Results   Component Value Date    WBC 17.5 (H) 03/16/2021    HGB 9.2 (L) 03/16/2021    Hemoglobin 9.3 (L) 03/11/2021    HCT 28.4 (L) 03/16/2021    Platelet 281 03/16/2021        IMAGING STUDIES:  ECG 12 Lead    Result Date: 03/14/2021  SINUS TACHYCARDIA POSSIBLE LEFT ATRIAL ENLARGEMENT BORDERLINE ECG WHEN COMPARED WITH ECG OF 06-Mar-2021 13:39, NO SIGNIFICANT CHANGE WAS FOUND Confirmed by Vickey Huger [phone]) on 03/14/2021 9:54:35 PM    CT Abdomen Pelvis Wo Contrast    Result Date: 03/16/2021  EXAM: CT ABDOMEN PELVIS WO CONTRAST DATE: 03/15/2021 5:24 PM ACCESSION: [id number] UN DICTATED: 03/15/2021 5:42 PM INTERPRETATION LOCATION: [facility]     CLINICAL INDICATION: 40 year old with Infectious concern      COMPARISON: Abdominal CT from 02/13/2020     TECHNIQUE: A spiral CT scan was obtained without IV contrast from the lung bases to the pubic symphysis.  Images were reconstructed in the axial plane. Coronal and sagittal reformatted images were also provided for further evaluation.     Evaluation of the solid organs and vasculature is limited in the absence of intravenous contrast.         FINDINGS:     LOWER CHEST: Please see same day chest CT for findings above the diaphragm.     LIVER: Normal liver contour. No focal liver lesion on non-contrast examination.     BILIARY: No intrahepatic biliary ductal dilatation.  The gallbladder is normal in appearance     SPLEEN: Splenomegaly measuring up to 14.5 cm in craniocaudal dimension. Normal splenic contour.     PANCREAS: Normal pancreatic contour without signs of inflammation or gross ductal dilatation.     ADRENAL GLANDS: Normal appearance of the adrenal glands.     KIDNEYS/URETERS: Smooth renal contours.  No nephrolithiasis.  No ureteral dilatation or collecting system distention. Mild bilateral perinephric soft tissue stranding, likely physiologic.     BLADDER: Decompressed with Foley catheter in place. Small-volume  gas within the bladder, secondary to instrumentation.     REPRODUCTIVE ORGANS: Unremarkable.     GI TRACT: Esophagogastric tube with tip in the gastric body. No focal gastric wall thickening. Duodenum is normal in course and caliber. No evidence of bowel obstruction. Mildly dilated colon with associated gaseous distention. No focal transition point. Mild circumferential wall thickening involving the distal sigmoid colon/rectum with adjacent soft tissue stranding, for reference (3:63). Normal appendix.     PERITONEUM/RETROPERITONEUM AND MESENTERY: No free air. No fluid collection.     VASCULATURE: Normal caliber aorta. [space for atherosclerosis] Otherwise, limited evaluation without contrast.     LYMPH NODES: No adenopathy.     BONES and SOFT TISSUES: No aggressive osseous lesions. Small bilateral fat-containing hernias. Postsurgical changes of the anterior abdominal wall. Multifocal soft tissue thickening in the superficial soft tissues of the anterior abdominal wall in the right lower quadrant (1:125), likely sequelae of injection.         --Mild circumferential wall thickening with adjacent soft tissue stranding involving the distal sigmoid colon/rectum. Findings are nonspecific although can be seen in the setting of early colitis/proctitis.     --Additional chronic and incidental findings as detailed above.     --Please see same day chest CT for findings above the diaphragm.     ==================== ADDENDUM (03/16/2021 4:55 AM): On review, the following additional findings were noted:     Agree with preliminary report above. In addition to findings suggestive of colitis/proctitis, there is notably liquid stool with air-fluid levels throughout the colon. This can be seen in diarrheal illness.    XR Chest Portable    Result Date: 03/10/2021  EXAM: XR CHEST PORTABLE DATE: 03/10/2021 1:26 PM ACCESSION: [id number] UN DICTATED: 03/10/2021 1:28 PM INTERPRETATION LOCATION: [facility]     CLINICAL INDICATION: 40 years old Male with ETT (VENTILATOR/RESPIRATOR DEP STATUS)      COMPARISON: Prior day portable chest radiograph     TECHNIQUE: Portable Chest Radiograph.     FINDINGS:     Unchanged support devices.     Similar patchy and confluent consolidation in the right lung, greatest in the mid zone. Persistent left lower lung consolidation.     Small right pleural effusion. No pneumothorax.     Stable cardiomediastinal silhouette.                 No significant interval change with persistent right lung airspace disease, densest in the mid zone, and left lower lung atelectasis/airspace disease.    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     FINDINGS:     Unchanged lines and tubes.     Interval improvement in heterogeneous airspace opacities bilaterally with residual patchy consolidation in right upper lobe and midlung.      Trace right pleural effusion. No pneumothorax.     Stable cardiomediastinal silhouette.                 Interval improvement in previously reported bilateral heterogeneous airspace opacities with residual patchy consolidation in right upper lobe and midlung.     Trace right pleural effusion.    CT Chest Wo Contrast    Result Date: 03/16/2021  EXAM: CT CHEST WO CONTRAST DATE: 03/15/2021 5:24 PM ACCESSION: [id number] UN DICTATED: 03/15/2021 5:32 PM INTERPRETATION LOCATION: [facility]     CLINICAL INDICATION: 40 years old Male with For management of infection, c/f empyema      COMPARISON: Concurrent CT abdomen and pelvis. CTA chest 12/28/2020.     TECHNIQUE: Contiguous 0.6 or 1 mm and 2 mm axial images were reconstructed through the chest following a single breath hold helical acquisition.  Images were reformatted in the axial and sagittal planes.  MIP slabs were also constructed.     FINDINGS:     LUNGS AND AIRWAYS: Moderate upper lobe predominant centrilobular emphysema. New consolidation with air bronchograms in the right upper lobe (2:37), and left lower lobe (2:74). Upper lobe predominant smooth interlobular septal thickening. There is additional nodularity and groundglass, for reference right lower lobe (2:73).  Endotracheal tube terminates in the thoracic trachea, above the carina. Small volume debris adjacent to the tip of the endotracheal tube. Cardiac bronchus (2:56).     PLEURA: Small left pleural effusion without pleural thickening. No pneumothorax.     MEDIASTINUM AND LYMPH NODES: Multiple mildly enlarged mediastinal lymph nodes, for reference 1.0 cm right paratracheal node (2:38). No enlarged axillary lymph nodes. Enteric tube courses below the diaphragm with tip terminating at the level of the gastric body. No other mediastinal abnormality.     HEART AND VASCULATURE:The cardiac chambers are normal in size. Extensive left ventricular myocardial calcifications, unchanged. Trace pericardial effusion.  Ascending and descending aorta normal in caliber.  Pulmonary artery normal in size.     BONES AND SOFT TISSUES: Unremarkable.     UPPER ABDOMEN: Unremarkable.                 1. Multifocal consolidative airspace disease is consistent with the concern for infectious pneumonia.     2. With regards to a potential empyema, the only notable findings include a nonspecific small pleural effusion in the dependent aspect of the left pleural space and trace loculated collections within the lateral aspect of the right chest (images 63 and 73, series 2).     3. Extensive calcification of the left ventricular myocardium is again evident.             Echocardiogram Follow Up/Limited Echo With Contrast    Result Date: 03/15/2021  Patient Info Name:     Adam Keith Age:     40 years DOB:     29-Dec-1980 Gender:     Male MRN:     [id number] Accession #:     [id number] UN Ht:     183 cm Wt:     110 kg BSA:     2.39 m2 BP:     139 /     71 mmHg Technical Quality:     Poor Exam Date:     03/15/2021 8:13 AM Site Location:     [facility] Exam Location:     [facility] Admit Date:     03/06/2021     Exam Type:     ECHOCARDIOGRAM FOLLOW UP/LIMITED ECHO WITH CONTRAST     Study Info Indications      - r/u endocarditis     Limited 2D and color flow transthoracic echocardiogram is performed with contrast to opacify the left ventricle and to improve the delineation of the left ventricle endocardial borders.     Staff Referring Physician:     [id number], Jerrol Banana; Reading Fellow:     Merrily Pew MD Sonographer:  Amy Damaris Schooner, RVT Ordering Physician:     Bernita Buffy     Account #:     [id number]     Ultrasound Enhancing Agent/Agitated Saline     ------------------------------ UEA/Ag. Saline:     Lumason Amount:     4.00 ml Administered By:     Earnest Conroy RDCS,  RVT Existing IV Access:     Yes IV Access Condition:     patent with no signs of infiltration     Reason for Poor Study:     poor echocardiographic windows         Summary   1. Technically difficult study.   2. The left ventricle is normal in size with normal wall thickness.   3. The left ventricular systolic function is normal, LVEF is visually estimated at > 55%.   4. The right ventricle is not well visualized but probably normal in size.   5. No apparent valvular or endocardial vegetation, however there is suboptimal valvular visualization.     Recommendations   * If there is ongoing clinical concern for endocarditis, transesophageal imaging can be considered for further evaluation.         Left Ventricle   The left ventricle is normal in size with normal wall thickness.   The left ventricular systolic function is normal, LVEF is visually estimated at > 55%.   There is normal left ventricular diastolic function.     Right Ventricle   The right ventricle is not well visualized but probably normal in size.         Left Atrium   The left atrium is not well visualized but probably normal in size.     Right Atrium   The right atrium is not well visualized but probably normal  in size.         Aortic Valve   The aortic valve is not well visualized.   There is no significant aortic regurgitation.     Pulmonic Valve   Pulmonary valve is not well visualized.     Mitral Valve   The mitral valve is not well visualized.   The mitral valve leaflets are normal with normal leaflet mobility.   There is no significant mitral valve regurgitation.     Tricuspid Valve   The tricuspid valve is not well visualized.         Pericardium/Pleural   There is no pericardial effusion.         Mitral Valve ---------------------------------------------------------------------- Name                                 Value        Normal ----------------------------------------------------------------------     MV Diastolic Function ---------------------------------------------------------------------- MV E Peak Velocity                 65 cm/s               MV A Peak Velocity 53 cm/s               MV E/A                                 1.2                   MV Annular TDI ---------------------------------------------------------------------- MV Septal e' Velocity  15.1 cm/s         >=8.0 MV Lateral e' Velocity            7.3 cm/s        >=10.0 MV e' Average                         11.2               MV E/e' (Average)                      6.6     Ventricles ---------------------------------------------------------------------- Name                                 Value        Normal ----------------------------------------------------------------------     LV Dimensions 2D/MM ---------------------------------------------------------------------- IVS Diastolic Thickness (2D)        0.8 cm       0.6-1.0 LVID Diastole (2D)                  4.8 cm       4.2-5.8 LVPW Diastolic Thickness (2D)                                0.9 cm       0.6-1.0 LVID Systole (2D)                   3.1 cm       2.5-4.0         Report Signatures Finalized by Yaakov Guthrie  MD on 03/15/2021 01:44 PM Resident [name]  MD on 03/15/2021 11:07 AM

## 2021-03-16 NOTE — Unmapped (Signed)
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 *          Cross cover summary     CT A/P findings: Mild circumferential wall thickening with adjacent soft tissue stranding involving the distal sigmoid colon/rectum. Findings are nonspecific although can be seen in the setting of early colitis/proctitis  CT Chest: Multifocal consolidation involving the right upper lobe, and the entirety of the left lower lobe with associated air bronchograms. There is additional nodularity and groundglass in the right lower lobe. Findings are suggestive of multifocal infection. Follow-up imaging is recommended in 6-8 weeks for further evaluation. Suggestion of Empyema & reactive.   ABG at 2000 stable from prior. ABG at 0200 decreased PaO2 at 68.6 though largely stable from day prior.     Juanda Chance, MD

## 2021-03-16 NOTE — Unmapped (Signed)
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   Per EMR, patient admitted on 03/06/21 with PMHx of ALL, COPD, prior MRSA bacteremia requiring intubation, MDD, lower back pain, tobacco use hx, presenting with??nausea, vomiting, fever initially found to be hypotensive and tachycardic at oncologists office, started on linezolid.??SOB worsened??in subsequent 24hrs??prompting presentation to the ED.??Admitted to the MICU due to c/f??acute hypoxic respiratory failure secondary to influenza infection with superimposed PNA.??  Patient assessed in MICU. He has remained supinated since last consult.   Focused assessment of nose, chest and posterior surface. The wound noted on the left upper chest is healing with new epithelial tissue. Continued dermagran gauze for moist wound healing.   Wound from ETT holder on columella has a dried adherent scab covering. Open to air.  Skin tear noted on patient's right posterior flank. Applied dermagran gauze and silicone bordered foam dressing for moist wound healing.   Patient repositioned with RN. Fluidized positioner placed under head.        03/16/21 0837   Wound 03/09/21 Pressure Injury Nose Mid Stage 2   Date First Assessed/Time First Assessed: 03/09/21 1005   Present on Hospital Admission: No  Primary Wound Type: Pressure Injury  Location: (c) Nose  Wound Location Orientation: Mid  Staging: Stage 2  Medical Device Related Pressure Injury: (c) Yes  St...   Wound Image    Dressing Status      No dressing   Wound Length (cm) 0.1 cm   Wound Width (cm) 0.2 cm   Wound Depth (cm) 0.1 cm   Wound Surface Area (cm^2) 0.02 cm^2   Wound Volume (cm^3) 0.002 cm^3   Wound Healing % 0   Wound Bed Pink  (Scab)   Odor None   Peri-wound Assessment      Intact   Exudate Amnt      None   Tunneling      No   Undermining     No   Treatments Cleansed/Irrigation   Dressing Open to air        03/16/21 0839   Wound 03/09/21 Skin Tear Chest Left   Date First Assessed/Time First Assessed: 03/09/21 1030   Present on Hospital Admission: No  Primary Wound Type: Skin Tear  Location: Chest  Wound Location Orientation: Left   Wound Image    Dressing Status      Changed   Wound Length (cm) 1 cm  Wound Width (cm) 0.5 cm   Wound Depth (cm) 0.1 cm   Wound Surface Area (cm^2) 0.5 cm^2   Wound Volume (cm^3) 0.05 cm^3   Wound Healing % 92   Wound Bed Pink   Odor None   Peri-wound Assessment      Intact   Exudate Type      Sero-sanguineous   Exudate Amnt      Scant   Tunneling      No   Undermining     No   Treatments Cleansed/Irrigation   Dressing Hydrophilic dressing;Silicone foam bordered dressing        03/16/21 0851   Wound 03/16/21 Skin Tear Back Lower;Right   Date First Assessed/Time First Assessed: 03/16/21 0849   Present on Hospital Admission: No  Primary Wound Type: Skin Tear  Location: Back  Wound Location Orientation: Lower;Right   Dressing Status      Changed   Wound Length (cm) 0.5 cm   Wound Width (cm) 2 cm   Wound Depth (cm) 0.1 cm   Wound Surface Area (cm^2) 1 cm^2   Wound Volume (cm^3) 0.1 cm^3   Wound Bed Red  (Dried blood)   Odor None   Peri-wound Assessment      Intact   Exudate Type      Sero-sanguineous   Exudate Amnt      Small   Tunneling No   Undermining     No   Treatments Cleansed/Irrigation   Dressing Hydrophilic dressing;Silicone foam bordered dressing     Continence Status:   Incontinence of bladder: Foley in place  Incontinent of bowel: incontinence pad    Moisture Associated Skin Damage:   - none noted     Lab Results   Component Value Date    WBC 17.5 (H) 03/16/2021    HGB 9.2 (L) 03/16/2021    HCT 28.4 (L) 03/16/2021    ESR 30 (H) 07/18/2020    CRP 44.0 (H) 12/28/2020    A1C 5.0 05/13/2020    GLU 106 03/16/2021    POCGLU 109 11/05/2020    ALBUMIN 2.1 (L) 03/16/2021    PROT 6.5 03/16/2021     Risk Factors:   - Friction/shear  - Immobility  - Lack of sensory perception  - Multiple co-morbidities  - pronation therapy    Braden Scale Score: 13         Support Surface:   - Low Air Loss - ICU    Type Debridement Completed By WOCN:  N/A    Teaching:  - n/a    WOCN Recommendations:   - See nursing orders for wound care instructions.  - Contact WOCN with questions, concerns, or wound deterioration.  - continue pressure injury prevention interventions and nutrition support.  - ok for columella wound to be ota  - continue dermagran gauze to left upper chest wound and posterior right flank, change daily    Topical Therapy/Interventions:   - Hydrophilic Gauze  - Silicone bordered foam    Recommended Consults:  - Not Applicable    WOCN Follow Up:  - We will sign off at this time    Plan of Care Discussed With:   - Family  - RN primary    Supplies Ordered: No    Workup Time:   30 minutes   Reconsult for questions/concerns/deterioration.   PAGE VIA  [facility] Directory  Available by Epic Chat   Renaye Rakers RN, BSN, CWOCN

## 2021-03-16 NOTE — Unmapped (Signed)
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 *    Interval history: Adam [name] with PMHx of ALL, COPD, prior MRSA bacteremia requiring intubation, MDD, lower back pain, tobacco use hx, presenting with??nausea, vomiting, fever initially found to be hypotensive and tachycardic at oncologists office, started on linezolid.??SOB worsened??in subsequent 24hrs??prompting presentation to the ED.??Admitted to the MICU due to c/f??acute hypoxic respiratory failure secondary to influenza infection with superimposed PNA.??    24 hr inter: Patient continues to fever. Patient FiO2 requirement stable at 50% FiO2, PEEP increased to 12.     Neurological   Sedation   Intubated secondary to worsening hypoxia, s/p vecuronium x1. Will increase analgesia and sedation given patient will prone for 24 hours after bronchoscopy.   -Start Dilaudid ggt 1 mg/ml  - Continue PRN dilaudid 2q 2hrs  -Continue PRN ativan 2 mg q4h  - Continue scheduled Ativan 2 q4hrs  - Continue schedule oxycodone to 15 q4hrs  - Continue gabapentin 100 q8hrs (dose reduced for renal function)    Chronic??Pain??- Peripheral neuropathy  Has chronic pain at baseline from??ALL. Follows with [facility].   - See above    MDD  Holding home clonazapam and nortriptyline   ??  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 Stable.     Pulmonary   AHRF 2/2 Influenza w/ superimposed MRSA PNA - s/p Intubation  S/p intubation 12/5 overnight. P/F ratio less than 150 currently . c/f ARDS given acute onset, bilateral infiltrates, low P/F continues to increase, no pulm edema. Patient continue to be on FiO2 40%. Given patients inability to decrease FiO2, and concern for other microbiological processes plan for bronchoscopy this PM for BAL. After plan to prone patient for 24 hours to see improvement with oxygenation.   -PRVC  - Continue Abx as indicated below   - Droplet Precautions??  - Follow serial ABGs to ensure appropriately compensating for acidosis (q6h)  - Consider CT chest w/o contrast if acute worsening of respiratory status, but otherwise imaging would not change current management.  ??  COPD  20 ppy tobacco use hx, followed by Pulmonology outpt. Per 01/25/21 no prior exacerbations. Utilizes home albuterol.   - O2 Goal 88-92%  - cont ANORO Ellipta  - Holding on steroids  - ABx as listed below  - prn duonebs  ??  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 Underwent unsuccessful thoracentesis, at the time referred to CT surgery, at the time didn't think warranted surgical intervention. POCUS attempted yesterday, did not show any dense consolidations. Obtain Echo to assess for vegetations.   -TTE negative for vegetations.   -Possible CT chest w/o contrast today  Cardiovascular   NAI    Renal   AKI  Likely prerenal vs ATN. Has not resolved with increasing amounts of fluids so ATN more likely. Stable UOP. Creatinine downtrending  - Consult nephrology, appreciate recommendations   -CTM    Infectious Disease/Autoimmune   Influenza c/b MRSA PNA - Hx of MRSA Bacteremia   MRSA screen positive, LRC growing MRSA. though BCx remain negative. CT chest obtained yesterday showed worsening multifocal consolidative airspace, with potential empyema given trace loculated collections with lateral aspect of the right chest. Given these finding patients will undergo bronscopy with BAL (with micro labs order below  - Infectious disease Immunocomp consulted, follow reccs.  - Completed Tamiflu (12/3-12/7)  - Continue Linezolid  (12/3-???)  -Restart aztreonam 1g q8h after bronchoscopy   -Per ID If patient fevers obtain blood cultures  -F/u AFB, aspergillus, RPP, pneumocystis, CMV, lymphocytes, cell count, actinomyces, fungal , bronchial culture  - follow up BCx: NGTD at 48 days  Follow-up [facility]: Positive for staph aureus  - consider CT Chest to further eval lung pathology if O2 status does not improve    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  - continue home Valtrex 500mg  daily  - continue home Bactrium (normally BID Sat-Sun)  - holding TKI per heme reccs see below  ??  History of DRESS syndrome??2/2 cephalosporin  - consulted ICID for reccs re: abx regimen  - Avoid beta lactams    Cultures:  Blood Culture, Routine (no units)   Date Value   03/13/2021 No Growth at 48 hours   03/13/2021 No Growth at 48 hours     Urine Culture, Comprehensive (no units)   Date Value   03/06/2021 NO GROWTH     Lower Respiratory Culture (no units)   Date Value   03/14/2021 2+ Staphylococcus aureus (A)   03/14/2021 Remainder of Culture in Progress     WBC (10*9/L)   Date Value   03/16/2021 17.5 (H)     WBC, UA (/HPF)   Date Value   03/14/2021 4 (H)          FEN/GI   Constipation-Possible residuals from tube feeds  Improved today. Having multiple BMs. Will decrease bowel regimen  -Stop Lactulose 20g daily  -restart Reglan 5 mg IV TID  -Stop senna 2 tablets BID      Malnutrition Assessment: Not done yet.  Body mass index is 32.2 kg/m??.  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 ??He subsequently had difficulty tolerating single agent dasatinib. S/p 4 cycles of ponatinib-blinatumomab.??He is now MRD-negative and on ponatinib monotherapy  - heme on board: Recc holding home TKI until malignant hematology evals for ready to resume. Patient's family will need update to bring in home supply then.    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 Follow-up IgG levels today      DVT PPx  - subcutaneous heparin 5000 units q 8hr    Endocrine   NAI    Integumentary   Skin Rash over anterior abdomen   Pictures in media tab. Suspect due to injury during proning. Stable  - Continue to monitor  - Wound care consult    - WOCN consulted for high risk skin assessment Yes.  - cont pressure mitigating precautions per skin policy    Prophylaxis/LDA/Restraints/Consults  Can CVC be removed? N/A, no CVC present (including vascular catheter for HD or PLEX)   Can A-line be removed? No: frequent ABGs  Can Foley be removed? No: Need continuous I/O  Mobility plan: Step 1 - Range of motion    Feeding: Tube feeds at goal  Analgesia: Pain adequately controlled, weaning  Sedation SAT/SBT: N/A  Thromboembolic ppx: SQ heparin  Head of bed >30 degrees: Yes  Ulcer ppx: Yes, mechanical ventilation > 3 days, not on tube feeds  Glucose within target range: Yes, in range    Does patient need/have an active type/screen? No    RASS at goal? Yes  [place] Agitation Assessment Scale (RASS) : +1 (03/16/2021  8:00 AM)     Can antipsychotics be stopped? N/A, not on antipsychotics  CAM-ICU Result: Negative (11/09/2020  9:00 PM)      Would hospice care be appropriate for this patient? No, patient improving or expected to improve  Any unaddressed hospice/palliative care needs? no    Patient Lines/Drains/Airways Status     Active Active Lines, Drains, & Airways     Name Placement date Placement time Site Days    ETT  7.5 03/07/21  2131  -- 8    NG/OG Tube Feedings Center mouth 03/08/21  0106  Center mouth  8 Urethral Catheter 03/07/21  2200  --  8    Peripheral IV 03/06/21 Left Arm 03/06/21  --  Arm  10    Peripheral IV 03/08/21 Right Antecubital 03/08/21  0102  Antecubital  8    Arterial Line 03/07/21 Right Radial 03/07/21  1500  Radial  8              Patient Lines/Drains/Airways Status     Active Wounds     Name Placement date Placement time Site Days    Wound 03/09/21 Pressure Injury Nose Mid Stage 2 03/09/21  1005  Nose  7    Wound 03/09/21 Skin Tear Chest Left 03/09/21  1030  Chest  7    Wound 03/16/21 Skin Tear Back Lower;Right 03/16/21  0849  Back  less than 1                Goals of Care     Code Status: Full Code    Designated Healthcare Decision Maker:  Adam Keith current decisional capacity for healthcare decision-making is Full capacity. His designated Educational psychologist) is/are   HCDM (patient stated preference): Adam Keith, Adam Keith - Spouse - [phone].      Subjective     See 24 hr int    Objective     Vitals - past 24 hours  Temp:  [3.2 ??C (37.8 ??F)-38 ??C (100.4 ??F)] 3.2 ??C (37.8 ??F)  Heart Rate:  [84-133] 125  SpO2 Pulse:  [84-132] 125  Resp:  [19-37] 37  FiO2 (%):  [50 %-100 %] 100 %  SpO2:  [93 %-100 %] 99 % Intake/Output  I/O last 3 completed shifts:  In: 2190 [I.V.:130; NG/GT:1460; IV Piggyback:600]  Out: 3930 [Urine:3930]     Physical Exam:    General:??Appears comfortable, sedated in NAD   HEENT:??NCAT, reports no sinus tenderness. Moist mucus membranes.  CV:??sinus tachycardia, no m/r/g. No clubbing  Pulm:??Ventilator breath sounds, 40% FiO2  GI:??Abdomen distended, soft, non-tender. Bowel sounds present.  MSK:??No rigidity. Diffuse myalgia. No LE swelling/size discrepancy.  Skin:??Unchanged Abrasion to left anterior chest wall, no crepitus, linear erythema on anterior abdomen  Neuro:??Sedated, not able to follow commands or track.  Continuous Infusions:   ??? HYDROmorphone 2 mg/hr (03/16/21 1323)   ??? sodium chloride         Scheduled Medications:   ??? alteplase  1 mg Intravenous Once   ??? aztreonam  1 g Intravenous Q8H   ??? carboxymethylcellulose sodium  2 drop Both Eyes Q6H [facility]   ??? chlorhexidine  15 mL Topical BID   ??? famotidine  20 mg Enteral tube: gastric  At bedtime   ??? gabapentin  100 mg Enteral tube: gastric  [facility]   ??? heparin (porcine) for subcutaneous use  5,000 Units Subcutaneous Q8H [facility]   ??? flu vacc qs2022-23 6mos up(PF)  0.5 mL Intramuscular During hospitalization   ??? ipratropium-albuteroL  3 mL Nebulization Q6H (RT)   ??? linezolid  600 mg Intravenous Q12H [facility]   ??? LORazepam  2 mg Intravenous Q4H [facility]   ??? melatonin  3 mg Enteral tube: gastric  QPM   ??? metoclopramide  5 mg Intravenous Q6H [facility]   ??? oxyCODONE  15 mg Enteral tube: gastric  Q4H [facility]   ??? QUEtiapine  100 mg Enteral tube: gastric  BID   ??? sulfamethoxazole-trimethoprim  1 tablet Oral 2 times per day on Sun Sat   ??? valACYclovir  500 mg Oral Daily       PRN medications:  acetaminophen, acetaminophen **AND** diphenhydrAMINE, albuterol, bisacodyL, Implement **AND** Care order/instruction **AND** Vital signs **AND** sodium chloride **AND** sodium chloride 0.9% **AND** diphenhydrAMINE **AND** famotidine **AND** meperidine **AND** methylPREDNISolone sodium succinate (PF) **AND** EPINEPHrine, fentaNYL (PF), haloperidol lactate, HYDROmorphone, LORazepam, midazolam, ondansetron    Data/Imaging Review: Reviewed in Epic and personally interpreted on 03/16/2021. See EMR for detailed results.

## 2021-03-16 NOTE — Unmapped (Signed)
Patient ICU status, drowsy. Patient on scheduled Oxy and Ativan.    Patient received PRN IV 2 mg Dilaudid x4 for pain with improved effect. Patient anxious received PRN IV 2 mg Ativan for sedation, with positive effect.     Patient's heart rate NSR to ST, blood pressure systolic 120-180s, afebrile, Tmax 100.1. Patient on ventilator, FiO2 at 50%.    Patient on tube feeds at trickle, has foley. Urine output ~1150 mL. Patient did have a BM tonight, loose.    Patient on turn schedule, received SQ heparin for blood clot prevention. Spouse at bedside overnight.      Problem: Adult Inpatient Plan of Care  Goal: Plan of Care Review  Outcome: Progressing  Goal: Patient-Specific Goal (Individualized)  Outcome: Progressing  Goal: Absence of Hospital-Acquired Illness or Injury  Outcome: Progressing  Intervention: Prevent Skin Injury  Recent Flowsheet Documentation  Taken 03/15/2021 2000 by Ceasar Mons, RN  Skin Protection:  ??? adhesive use limited  ??? incontinence pads utilized  ??? mittens applied to hands  ??? tubing/devices free from skin contact  Goal: Optimal Comfort and Wellbeing  Outcome: Progressing  Goal: Readiness for Transition of Care  Outcome: Progressing  Goal: Rounds/Family Conference  Outcome: Progressing     Problem: Infection  Goal: Absence of Infection Signs and Symptoms  Outcome: Progressing     Problem: Skin Injury Risk Increased  Goal: Skin Health and Integrity  Outcome: Progressing  Intervention: Optimize Skin Protection  Recent Flowsheet Documentation  Taken 03/15/2021 2000 by Ceasar Mons, RN  Pressure Reduction Techniques:  ??? heels elevated off bed  ??? pressure points protected  ??? weight shift assistance provided  Pressure Reduction Devices:  ??? heel offloading device utilized  ??? positioning supports utilized  ??? pressure-redistributing mattress utilized  Skin Protection:  ??? adhesive use limited  ??? incontinence pads utilized  ??? mittens applied to hands  ??? tubing/devices free from skin contact     Problem: Fluid Imbalance (Pneumonia)  Goal: Fluid Balance  Outcome: Progressing     Problem: Infection (Pneumonia)  Goal: Resolution of Infection Signs and Symptoms  Outcome: Progressing     Problem: Respiratory Compromise (Pneumonia)  Goal: Effective Oxygenation and Ventilation  Outcome: Progressing     Problem: Pain Chronic (Persistent) (Comorbidity Management)  Goal: Acceptable Pain Control and Functional Ability  Outcome: Progressing     Problem: Self-Care Deficit  Goal: Improved Ability to Complete Activities of Daily Living  Outcome: Progressing     Problem: Communication Impairment (Mechanical Ventilation, Invasive)  Goal: Effective Communication  Outcome: Progressing     Problem: Device-Related Complication Risk (Mechanical Ventilation, Invasive)  Goal: Optimal Device Function  Outcome: Progressing     Problem: Inability to Wean (Mechanical Ventilation, Invasive)  Goal: Mechanical Ventilation Liberation  Outcome: Progressing     Problem: Nutrition Impairment (Mechanical Ventilation, Invasive)  Goal: Optimal Nutrition Delivery  Outcome: Progressing     Problem: Skin and Tissue Injury (Mechanical Ventilation, Invasive)  Goal: Absence of Device-Related Skin and Tissue Injury  Outcome: Progressing  Intervention: Maintain Skin and Tissue Health  Recent Flowsheet Documentation  Taken 03/15/2021 2000 by Ceasar Mons, RN  Device Skin Pressure Protection:  ??? absorbent pad utilized/changed  ??? positioning supports utilized  ??? pressure points protected  ??? mittens applied to hands     Problem: Ventilator-Induced Lung Injury (Mechanical Ventilation, Invasive)  Goal: Absence of Ventilator-Induced Lung Injury  Outcome: Progressing  Intervention: Prevent Ventilator-Associated Pneumonia  Recent Flowsheet Documentation  Taken  03/16/2021 0400 by Ceasar Mons, RN  Oral Care:  ??? lip/mouth moisturizer applied  ??? mouth swabbed  ??? oral rinse provided  ??? suction provided  Taken 03/16/2021 0000 by Ceasar Mons, RN  Oral Care:  ??? lip/mouth moisturizer applied  ??? mouth swabbed  ??? oral rinse provided  ??? teeth brushed  ??? tongue brushed  Taken 03/15/2021 2000 by Ceasar Mons, RN  Oral Care:  ??? lip/mouth moisturizer applied  ??? mouth swabbed  ??? oral rinse provided  ??? suction provided     Problem: Impaired Wound Healing  Goal: Optimal Wound Healing  Outcome: Progressing     Problem: Fall Injury Risk  Goal: Absence of Fall and Fall-Related Injury  Outcome: Progressing

## 2021-03-17 LAB — BASIC METABOLIC PANEL
ANION GAP: 9 mmol/L (ref 5–14)
ANION GAP: 11 mmol/L (ref 5–14)
BLOOD UREA NITROGEN: 41 mg/dL — ABNORMAL HIGH (ref 9–23)
BLOOD UREA NITROGEN: 38 mg/dL — ABNORMAL HIGH (ref 9–23)
BUN / CREAT RATIO: 20
BUN / CREAT RATIO: 20
CALCIUM: 8.9 mg/dL (ref 8.7–10.4)
CALCIUM: 8.6 mg/dL — ABNORMAL LOW (ref 8.7–10.4)
CHLORIDE: 114 mmol/L — ABNORMAL HIGH (ref 98–107)
CHLORIDE: 116 mmol/L — ABNORMAL HIGH (ref 98–107)
CO2: 22 mmol/L (ref 20.0–31.0)
CO2: 23 mmol/L (ref 20.0–31.0)
CREATININE: 2.02 mg/dL — ABNORMAL HIGH
CREATININE: 1.89 mg/dL — ABNORMAL HIGH
EGFR CKD-EPI (2021) MALE: 42 mL/min/{1.73_m2} — ABNORMAL LOW (ref >=60)
EGFR CKD-EPI (2021) MALE: 45 mL/min/{1.73_m2} — ABNORMAL LOW (ref >=60)
GLUCOSE RANDOM: 97 mg/dL (ref 70–179)
GLUCOSE RANDOM: 100 mg/dL (ref 70–179)
POTASSIUM: 4.1 mmol/L (ref 3.4–4.8)
POTASSIUM: 4.2 mmol/L (ref 3.4–4.8)
SODIUM: 145 mmol/L (ref 135–145)
SODIUM: 150 mmol/L — ABNORMAL HIGH (ref 135–145)

## 2021-03-17 LAB — CBC W/ AUTO DIFF
BASOPHILS ABSOLUTE COUNT: 0.1 10*9/L (ref 0.0–0.1)
BASOPHILS RELATIVE PERCENT: 0.5 %
EOSINOPHILS ABSOLUTE COUNT: 0.1 10*9/L (ref 0.0–0.5)
EOSINOPHILS RELATIVE PERCENT: 0.8 %
HEMATOCRIT: 27.4 % — ABNORMAL LOW (ref 39.0–48.0)
HEMOGLOBIN: 8.7 g/dL — ABNORMAL LOW (ref 12.9–16.5)
LYMPHOCYTES ABSOLUTE COUNT: 1 10*9/L — ABNORMAL LOW (ref 1.1–3.6)
LYMPHOCYTES RELATIVE PERCENT: 6.9 %
MEAN CORPUSCULAR HEMOGLOBIN CONC: 31.9 g/dL — ABNORMAL LOW (ref 32.0–36.0)
MEAN CORPUSCULAR HEMOGLOBIN: 28.2 pg (ref 25.9–32.4)
MEAN CORPUSCULAR VOLUME: 88.3 fL (ref 77.6–95.7)
MEAN PLATELET VOLUME: 9.3 fL (ref 6.8–10.7)
MONOCYTES ABSOLUTE COUNT: 0.6 10*9/L (ref 0.3–0.8)
MONOCYTES RELATIVE PERCENT: 4.4 %
NEUTROPHILS ABSOLUTE COUNT: 12.3 10*9/L — ABNORMAL HIGH (ref 1.8–7.8)
NEUTROPHILS RELATIVE PERCENT: 87.4 %
PLATELET COUNT: 289 10*9/L (ref 150–450)
RED BLOOD CELL COUNT: 3.1 10*12/L — ABNORMAL LOW (ref 4.26–5.60)
RED CELL DISTRIBUTION WIDTH: 17.1 % — ABNORMAL HIGH (ref 12.2–15.2)
WBC ADJUSTED: 14.1 10*9/L — ABNORMAL HIGH (ref 3.6–11.2)

## 2021-03-17 LAB — BLOOD GAS, ARTERIAL
BASE EXCESS ARTERIAL: -3.1 — ABNORMAL LOW (ref -2.0–2.0)
BASE EXCESS ARTERIAL: -3.1 — ABNORMAL LOW (ref -2.0–2.0)
BASE EXCESS ARTERIAL: -3.1 — ABNORMAL LOW (ref -2.0–2.0)
BASE EXCESS ARTERIAL: -6.7 — ABNORMAL LOW (ref -2.0–2.0)
HCO3 ARTERIAL: 21 mmol/L — ABNORMAL LOW (ref 22–27)
HCO3 ARTERIAL: 21 mmol/L — ABNORMAL LOW (ref 22–27)
HCO3 ARTERIAL: 22 mmol/L (ref 22–27)
HCO3 ARTERIAL: 18 mmol/L — ABNORMAL LOW (ref 22–27)
O2 SATURATION ARTERIAL: 94.7 % (ref 94.0–100.0)
O2 SATURATION ARTERIAL: 97.6 % (ref 94.0–100.0)
O2 SATURATION ARTERIAL: 94.3 % (ref 94.0–100.0)
O2 SATURATION ARTERIAL: 97.2 % (ref 94.0–100.0)
PCO2 ARTERIAL: 33.2 mmHg — ABNORMAL LOW (ref 35.0–45.0)
PCO2 ARTERIAL: 31.2 mmHg — ABNORMAL LOW (ref 35.0–45.0)
PCO2 ARTERIAL: 42.6 mmHg (ref 35.0–45.0)
PCO2 ARTERIAL: 31.5 mmHg — ABNORMAL LOW (ref 35.0–45.0)
PH ARTERIAL: 7.41 (ref 7.35–7.45)
PH ARTERIAL: 7.43 (ref 7.35–7.45)
PH ARTERIAL: 7.33 — ABNORMAL LOW (ref 7.35–7.45)
PH ARTERIAL: 7.37 (ref 7.35–7.45)
PO2 ARTERIAL: 73.6 mmHg — ABNORMAL LOW (ref 80.0–110.0)
PO2 ARTERIAL: 98.6 mmHg (ref 80.0–110.0)
PO2 ARTERIAL: 75 mmHg — ABNORMAL LOW (ref 80.0–110.0)
PO2 ARTERIAL: 99.4 mmHg (ref 80.0–110.0)

## 2021-03-17 LAB — MAGNESIUM
MAGNESIUM: 1.8 mg/dL (ref 1.6–2.6)
MAGNESIUM: 1.7 mg/dL (ref 1.6–2.6)

## 2021-03-17 LAB — HEPATIC FUNCTION PANEL
ALBUMIN: 2.1 g/dL — ABNORMAL LOW (ref 3.4–5.0)
ALKALINE PHOSPHATASE: 209 U/L — ABNORMAL HIGH (ref 46–116)
ALT (SGPT): 16 U/L (ref 10–49)
AST (SGOT): 27 U/L (ref <=34)
BILIRUBIN DIRECT: 0.5 mg/dL — ABNORMAL HIGH (ref 0.00–0.30)
BILIRUBIN TOTAL: 0.8 mg/dL (ref 0.3–1.2)
PROTEIN TOTAL: 6.4 g/dL (ref 5.7–8.2)

## 2021-03-17 LAB — PHOSPHORUS: PHOSPHORUS: 3.7 mg/dL (ref 2.4–5.1)

## 2021-03-17 MED ADMIN — ertapenem (INVanz) 1 g in sodium chloride 0.9 % (NS) 100 mL IVPB-connector bag: 1 g | INTRAVENOUS | @ 22:00:00 | Stop: 2021-03-17

## 2021-03-17 MED ADMIN — polyethylene glycol (MIRALAX) packet 17 g: 17 g | GASTROENTERAL | @ 18:00:00 | Stop: 2021-03-17

## 2021-03-17 MED ADMIN — oxyCODONE (ROXICODONE) immediate release tablet 15 mg: 15 mg | GASTROENTERAL | @ 23:00:00 | Stop: 2021-03-31

## 2021-03-17 MED ADMIN — gabapentin (NEURONTIN) oral solution: 100 mg | GASTROENTERAL | @ 11:00:00

## 2021-03-17 MED ADMIN — metoclopramide (REGLAN) injection 5 mg: 5 mg | INTRAVENOUS | @ 23:00:00

## 2021-03-17 MED ADMIN — HYDROmorphone 1 mg/mL in 0.9% sodium chloride: 0-3 mg/h | INTRAVENOUS | @ 05:00:00 | Stop: 2021-03-17

## 2021-03-17 MED ADMIN — senna (SENOKOT) tablet 1 tablet: 1 | GASTROENTERAL | @ 18:00:00

## 2021-03-17 MED ADMIN — carboxymethylcellulose sodium (THERATEARS) 0.25 % ophthalmic solution 2 drop: 2 [drp] | OPHTHALMIC | @ 05:00:00

## 2021-03-17 MED ADMIN — oxyCODONE (ROXICODONE) immediate release tablet 15 mg: 15 mg | GASTROENTERAL | @ 11:00:00 | Stop: 2021-03-17

## 2021-03-17 MED ADMIN — metoclopramide (REGLAN) injection 5 mg: 5 mg | INTRAVENOUS | @ 11:00:00

## 2021-03-17 MED ADMIN — ipratropium-albuteroL (DUO-NEB) 0.5-2.5 mg/3 mL nebulizer solution 3 mL: 3 mL | RESPIRATORY_TRACT | @ 02:00:00

## 2021-03-17 MED ADMIN — ipratropium-albuteroL (DUO-NEB) 0.5-2.5 mg/3 mL nebulizer solution 3 mL: 3 mL | RESPIRATORY_TRACT | @ 21:00:00

## 2021-03-17 MED ADMIN — HYDROmorphone (PF) (DILAUDID) injection 2 mg: 2 mg | INTRAVENOUS | @ 09:00:00 | Stop: 2021-03-21

## 2021-03-17 MED ADMIN — metoclopramide (REGLAN) injection 5 mg: 5 mg | INTRAVENOUS | @ 05:00:00

## 2021-03-17 MED ADMIN — melatonin tablet 3 mg: 3 mg | GASTROENTERAL | @ 23:00:00

## 2021-03-17 MED ADMIN — LORazepam (ATIVAN) injection 2 mg: 2 mg | INTRAVENOUS | @ 09:00:00

## 2021-03-17 MED ADMIN — gabapentin (NEURONTIN) oral solution: 100 mg | GASTROENTERAL | @ 18:00:00

## 2021-03-17 MED ADMIN — valACYclovir (VALTREX) tablet 500 mg: 500 mg | ORAL | @ 14:00:00

## 2021-03-17 MED ADMIN — chlorhexidine (PERIDEX) 0.12 % solution 15 mL: 15 mL | TOPICAL | @ 01:00:00

## 2021-03-17 MED ADMIN — HYDROmorphone (PF) (DILAUDID) injection 2 mg: 2 mg | INTRAVENOUS | @ 14:00:00 | Stop: 2021-03-21

## 2021-03-17 MED ADMIN — famotidine (PEPCID) tablet 20 mg: 20 mg | GASTROENTERAL | @ 01:00:00

## 2021-03-17 MED ADMIN — LORazepam (ATIVAN) injection 2 mg: 2 mg | INTRAVENOUS | @ 07:00:00 | Stop: 2021-03-17

## 2021-03-17 MED ADMIN — carboxymethylcellulose sodium (THERATEARS) 0.25 % ophthalmic solution 2 drop: 2 [drp] | OPHTHALMIC | @ 19:00:00

## 2021-03-17 MED ADMIN — linezolid in dextrose 5% (ZYVOX) 600 mg/300 mL IVPB 600 mg: 600 mg | INTRAVENOUS | @ 01:00:00 | Stop: 2021-03-19

## 2021-03-17 MED ADMIN — ipratropium-albuteroL (DUO-NEB) 0.5-2.5 mg/3 mL nebulizer solution 3 mL: 3 mL | RESPIRATORY_TRACT | @ 09:00:00

## 2021-03-17 MED ADMIN — carboxymethylcellulose sodium (THERATEARS) 0.25 % ophthalmic solution 2 drop: 2 [drp] | OPHTHALMIC

## 2021-03-17 MED ADMIN — ondansetron (ZOFRAN) injection 8 mg: 8 mg | INTRAVENOUS | @ 15:00:00 | Stop: 2021-03-19

## 2021-03-17 MED ADMIN — LORazepam (ATIVAN) injection 2 mg: 2 mg | INTRAVENOUS | @ 03:00:00

## 2021-03-17 MED ADMIN — gabapentin (NEURONTIN) oral solution: 100 mg | GASTROENTERAL | @ 03:00:00

## 2021-03-17 MED ADMIN — heparin (porcine) 5,000 unit/mL injection 5,000 Units: 5000 [IU] | SUBCUTANEOUS | @ 18:00:00

## 2021-03-17 MED ADMIN — aztreonam (AZACTAM) 1 g in sodium chloride 0.9 % (NS) 100 mL IVPB-connector bag: 1 g | INTRAVENOUS | @ 18:00:00 | Stop: 2021-03-17

## 2021-03-17 MED ADMIN — LORazepam (ATIVAN) injection 2 mg: 2 mg | INTRAVENOUS | @ 11:00:00 | Stop: 2021-03-17

## 2021-03-17 MED ADMIN — metoclopramide (REGLAN) injection 5 mg: 5 mg | INTRAVENOUS | @ 18:00:00

## 2021-03-17 MED ADMIN — acetaminophen (TYLENOL) tablet 650 mg: 650 mg | ORAL | @ 11:00:00

## 2021-03-17 MED ADMIN — chlorhexidine (PERIDEX) 0.12 % solution 15 mL: 15 mL | TOPICAL | @ 15:00:00 | Stop: 2021-03-17

## 2021-03-17 MED ADMIN — heparin (porcine) 5,000 unit/mL injection 5,000 Units: 5000 [IU] | SUBCUTANEOUS | @ 11:00:00

## 2021-03-17 MED ADMIN — HYDROmorphone (PF) (DILAUDID) injection 2 mg: 2 mg | INTRAVENOUS | @ 02:00:00 | Stop: 2021-03-21

## 2021-03-17 MED ADMIN — aztreonam (AZACTAM) 1 g in sodium chloride 0.9 % (NS) 100 mL IVPB-connector bag: 1 g | INTRAVENOUS | @ 03:00:00 | Stop: 2021-03-23

## 2021-03-17 MED ADMIN — oxyCODONE (ROXICODONE) immediate release tablet 15 mg: 15 mg | GASTROENTERAL | @ 14:00:00 | Stop: 2021-03-17

## 2021-03-17 MED ADMIN — heparin (porcine) 5,000 unit/mL injection 5,000 Units: 5000 [IU] | SUBCUTANEOUS | @ 03:00:00

## 2021-03-17 MED ADMIN — carboxymethylcellulose sodium (THERATEARS) 0.25 % ophthalmic solution 2 drop: 2 [drp] | OPHTHALMIC | @ 11:00:00

## 2021-03-17 MED ADMIN — ipratropium-albuteroL (DUO-NEB) 0.5-2.5 mg/3 mL nebulizer solution 3 mL: 3 mL | RESPIRATORY_TRACT | @ 15:00:00

## 2021-03-17 MED ADMIN — aztreonam (AZACTAM) 1 g in sodium chloride 0.9 % (NS) 100 mL IVPB-connector bag: 1 g | INTRAVENOUS | @ 11:00:00 | Stop: 2021-03-17

## 2021-03-17 MED ADMIN — linezolid in dextrose 5% (ZYVOX) 600 mg/300 mL IVPB 600 mg: 600 mg | INTRAVENOUS | @ 15:00:00 | Stop: 2021-03-19

## 2021-03-17 MED ADMIN — oxyCODONE (ROXICODONE) immediate release tablet 15 mg: 15 mg | GASTROENTERAL | @ 07:00:00 | Stop: 2021-03-17

## 2021-03-17 MED ADMIN — HYDROmorphone (PF) (DILAUDID) injection 2 mg: 2 mg | INTRAVENOUS | @ 05:00:00 | Stop: 2021-03-21

## 2021-03-17 MED ADMIN — LORazepam (ATIVAN) injection 2 mg: 2 mg | INTRAVENOUS | @ 14:00:00 | Stop: 2021-03-17

## 2021-03-17 MED ADMIN — LORazepam (ATIVAN) injection 2 mg: 2 mg | INTRAVENOUS | @ 02:00:00

## 2021-03-17 MED ADMIN — oxyCODONE (ROXICODONE) immediate release tablet 15 mg: 15 mg | GASTROENTERAL | @ 03:00:00 | Stop: 2021-03-24

## 2021-03-17 NOTE — Unmapped (Addendum)
Pt Swim/No distress or complications noted

## 2021-03-17 NOTE — Unmapped (Signed)
Pt swim/No distress or complications noted at this time.

## 2021-03-17 NOTE — Unmapped (Signed)
IMMUNOCOMPROMISED HOST INFECTIOUS DISEASE PROGRESS NOTE      Adam Keith is being seen in consultation at the request of [name], MD for evaluation of pneumonia.    Assessment/Recommendations:    Adam 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 Patient received linezolid and oseltamavir (s/p 5d, through 12/7), as well as initial aztreonam. We are concerned that his persistent fevers may be secondary to a MRSA pulmonary abscess/empyema, and recommend when possible for CT chest to evaluate this further. Given his ongoing fevers while on appropriate therapy would also be reasonable to CT abdomen and pelvis to assess for any other potential sites of infection (although fevers could certainly be attributed to his severe pneumonia.) Patient tolerated IVIG 12/9, continues to be febrile.     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 krusei fungemia  - Prior chemotherapy: [id number]-2005 induction with dasatinib (vincristine, dexamethasone and dasatinib); C1D1 01/24/2020; difficulty tolerating single-agent dasatinib  - Current chemotherapy: blinatumomab (anti-CD19/CD3) + ponatinib (TKI) s/p C4 on 12/14/20; 01/2021 now on ponatinib monotherapy maintenance  - 01/11/21: Normocellular bone marrow (30% overall) with trilineage hematopoiesis and less than 1% blasts by manual aspirate differential; Flow cytometry MRD analysis reveals no definitive immunophenotypic evidence of residual B lymphoblastic leukemia; BCR-ABL p210 transcripts were detected at a level of 0.002 IS % ratio in bone marrow.    # Hypogammaglobulimia  - 11/01/20 IgG 266 declined IVIG  - 03/11/21 IgG 148 - IVIG infusion stopped as patient developed rigors, tachycardia and HTN during infusion despite pre-medication   - 03/12/21 IgG 213 - completed IVIG with premedications and slower rate of infusion    # AKI  Estimated Creatinine Clearance: 61 mL/min (A) (based on SCr of 2.07 mg/dL (H)).  - b/l Cr ~1.6-1.8  - 12/9 Nephrology considering CRRT.     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  - 12/6 tracheal aspirate: MRSA  - 12/8 unable to tolerate IVIG  - 12/9 IVIg  - 12/11 Lower resp Cx - staph aureus 2+ , klebsiella oxytoca  - 12/11 CT Chest : Multifocal consolidative airspace disease, nonspecific small pleural effusion in the dependent aspect of the left pleural space and trace loculated collections within the lateral aspect of the right chest.  -12/11 CT abdomen: Mild circumferential wall thickening with adjacent soft tissue stranding involving the  distal sigmoid colon/rectum. Findings are nonspecific although can be seen in the setting of early colitis/proctitis  Tx : Linezolid /Oseltamivir---> Linezolid/aztreonam/flagyl/azithromycin/oseltamivir 12/3 --> linezolid/aztreonam/osteltamivir 12/3 -->linezolid [name] 12/5--->linezolid 12/6 -->    #Left upper chest, pressure ulcer from proning 03/11/21    Prior infections:  #MRSA RLL PNA c/b bacteremia 11/01/2020, probable right empyema 8/12/202; presumed relapsed RML MRSA pneumonia 11/18/2020  - 8/19 Failed thoracocentesis by IP  - 9/26 CT chest parenchyma without e/o infection, persistent but decreased loculated effusions on right  - s/p MRSA antibiotics until 01/04/2021  #Persistent right chest pain 12/29/20; Low grade fever, weakness 01/28/21  - 02/02/2021 - Dr. 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 F/u 12/10 BCx and repeat BCx for new fever spikes  ?? Send C.diff / GIPP if within guidelines  ?? Repeat Serum IG levels    Monitoring for antimicrobial toxicities  ??? Monitor CBC with diff and CMP at least twice weekly    Treatment  # Influenza A with superimposed  bacterial pneumonia 03/05/21 : MRSA/Klebsiella oxytoca  ?? START iv aztreonam 1g q8h  ?? CONTINUE linezolid 600 mg bid for treatment of MRSA pneumonia      Prophylaxis  ??? Cont valacyclovir, bactrim  ??? Recommend COVID BV and Flu shot after discharge          The [facility] ID service will continue to follow.  Please page the ID Transplant/Liquid Oncology Fellow consult at [phone] with questions.  Patient was discussed with Dr Raylene Miyamoto.    Kandice Moos, MD  Fellow, Division of Infectious Diseases    Interval HPI:      Source of information includes:  Electronic Medical Records.  History obtained from: EMR , primary team  Interval events since last encounter: Steady fevers in 38s, this on 2g Tylenol/24h and cooling blanket. Borderline tachycardic. Remains intubated, FiO2 40-50% (improvement), PEEP 12. Not hypotensive.  HPI: Patient unable to provide.   ROS: intubated, sedated, unable to obtain    Medications:   Antimicrobials:  Linezolid 12/3 ->  Daptomycin 12/3 X 1  Oseltamivir 12/2 ->12/7  Aztreonam 12/3 -> 12/6 12/13-->  Azithromycin 12/3 -12/4   Metronidazole 12/3 x 1    Ppx: TMP/SMX, valacyclovir    Current/Prior immunomodulators:  ponatinib 30mg  monotherapy (held)    Other medications reviewed.        Vital Signs last 24 hours:  Temp:  [3.2 ??C (37.8 ??F)-37.8 ??C (100.1 ??F)] 3.2 ??C (37.8 ??F)  Heart Rate:  [84-133] 125  SpO2 Pulse:  [84-132] 125  Resp:  [19-37] 37  A BP-2: (120-217)/(61-101) 189/81  MAP:  [77 mmHg-136 mmHg] 112 mmHg  FiO2 (%):  [40 %-100 %] 40 %  SpO2:  [93 %-100 %]  99 %    Physical Exam:   Patient Lines/Drains/Airways Status     Active Active Lines, Drains, & Airways     Name Placement date Placement time Site Days    ETT  7.5 03/07/21  2131  -- 8    NG/OG Tube Feedings Center mouth 03/08/21  0106  Center mouth  8    Urethral Catheter 03/07/21  2200  --  8    Peripheral IV 03/06/21 Left Arm 03/06/21  --  Arm  10    Peripheral IV 03/08/21 Right Antecubital 03/08/21  0102  Antecubital  8    Arterial Line 03/07/21 Right Radial 03/07/21  1500  Radial  9                Const [x]  vital signs above    []  NAD, non-toxic appearance []  Chronically ill-appearing, non-distressed  Intubated, sedated but opening eyes and reacting some to exam, on vent      Eyes [x]  Lids normal bilaterally, conjunctiva anicteric and noninjected OU     [] PERRL  [] EOMI        ENMT [x]  Normal appearance of external nose and ears, no nasal discharge        [x]  MMM, no lesions on lips or gums []  No thrush, leukoplakia, oral lesions  []  Dentition good []  Edentulous []  Dental caries present  []  Hearing normal  []  TMs with good light reflexes bilaterally   Intubated      Neck [x]  Neck of normal appearance and trachea midline        []  No thyromegaly, nodules, or tenderness   []  Full neck ROM        Lymph []  No LAD in neck     []  No LAD in supraclavicular area     []  No LAD in axillae   []  No LAD in epitrochlear chains     []  No LAD in inguinal areas        CV []  RRR            [x]  No peripheral edema     [x]  Pedal pulses intact   [x]  No abnormal heart sounds appreciated   [x]  Extremities WWP   Regular, borderline tachy      Resp []  Normal WOB at rest    []  No breathlessness with speaking, no coughing  []  CTA anteriorly    []  CTA posteriorly    Ventilated; coarse breath sounds bilaterally      GI [x]  Normal inspection, NTND   [x]  NABS     [x]  No umbilical hernia on exam       []  No hepatosplenomegaly     []  Inspection of perineal and perianal areas normal        GU [x]  Normal external genitalia     [] No urinary catheter present in urethra   []  No CVA tenderness    []  No tenderness over renal allograft  Foley present      MSK [x]  No clubbing or cyanosis of hands       []  No vertebral point tenderness  [x]  No focal tenderness or abnormalities on palpation of joints in RUE, LUE, RLE, or LLE        Skin [x]  No rashes, lesions, or ulcers of visualized skin     [x]  Skin warm and dry to palpation   Bullae on back not visualized; L anterior chest pressure ulcer covered      Neuro [x]  Face expression symmetric  []   Sensation to light touch grossly intact throughout    []  Moves extremities equally    [x]  No tremor noted        []  CNs II-XII grossly intact     []  DTRs normal and symmetric throughout []  Gait unremarkable  Intubated, on Precedex, unable to assess      Psych []  Appropriate affect       []  Fluent speech         []  Attentive, good eye contact  []  Oriented to person, place, time          []  Judgment and insight are appropriate   Intubated, on Precedex, unable to assess        Data for Medical Decision Making     Recent Labs   Lab Units 03/16/21  0331 03/11/21  1346 03/11/21  1102   WBC 10*9/L 17.5*   < >  --    HEMOGLOBIN g/dL 9.2*   < >  --    HEMOGLOBIN BG   --    < >  --    PLATELET COUNT (1) 10*9/L 281   < >  --    NEUTRO ABS 10*9/L 15.3*   < >  --    LYMPHO ABS 10*9/L 1.1   < >  --    EOSINO ABS 10*9/L 0.1   < >  --    SODIUM WHOLE BLOOD   --    < >  --    SODIUM mmol/L 146*   < >  --    POTASSIUM WHOLE BLOOD   --    < >  --    POTASSIUM mmol/L 3.7   < >  --    BUN mg/dL 42*   < >  --    CREATININE mg/dL 1.61*   < >  --    GLUCOSE mg/dL 096   < >  --    CALCIUM mg/dL 8.7   < >  --    MAGNESIUM mg/dL 1.9   < >  --    PHOSPHORUS mg/dL 3.4   < >  --    BILIRUBIN TOTAL mg/dL 0.7   < >  --    AST U/L 30   < >  --    ALT U/L 16   < >  --    IGG mg/dL  --   --  045*    < > = values in this interval not displayed.     I reviewed and noted the following labs: downtrending leukocytosis 17.5 . Anemia with Hgb 9.2 Cr elevated 2.07, decreasing. AST/ALT 37/19.    Microbiology:  Past cultures were reviewed in Epic and CareEverywhere.  Microbiology Results (last day)     Procedure Component Value Date/Time Date/Time    Blood Culture #1 [[id number]]  (Normal) Collected: 03/09/21 0458    Lab Status: Final result Specimen: Blood from 1 Peripheral Draw Updated: 03/14/21 0545     Blood Culture, Routine No Growth at 5 days    Blood Culture #2 [[id number]]  (Normal) Collected: 03/09/21 0458    Lab Status: Final result Specimen: Blood from 1 Peripheral Draw Updated: 03/14/21 0545     Blood Culture, Routine No Growth at 5 days    Lower Respiratory Culture [[id number]] Collected: 03/14/21 0254    Lab Status: Preliminary result Specimen: Sputum from Lung, Combined Updated: 03/14/21 0323     Gram Stain <10  Epithelial cells/LPF      >25 PMNS/LPF  1+ Gram positive cocci      Yeast present      Acceptable for culture    Narrative:      Specimen Source: Lung, Combined    Blood Culture, Adult [[id number]] Collected: 03/13/21 2339    Lab Status: In process Specimen: Blood from 1 Peripheral Draw Updated: 03/13/21 2344    Blood Culture, Adult [[id number]] Collected: 03/13/21 2339    Lab Status: In process Specimen: Blood from 1 Peripheral Draw Updated: 03/13/21 2344        Imaging:  CT Abdomen Pelvis Wo Contrast    Result Date: 03/16/2021  EXAM: CT ABDOMEN PELVIS WO CONTRAST DATE: 03/15/2021 5:24 PM ACCESSION: [id number] UN DICTATED: 03/15/2021 5:42 PM INTERPRETATION LOCATION: [facility] CLINICAL INDICATION: 40 year old with Infectious concern  COMPARISON: Abdominal CT from 02/13/2020 TECHNIQUE: A spiral CT scan was obtained without IV contrast from the lung bases to the pubic symphysis.  Images were reconstructed in the axial plane. Coronal and sagittal reformatted images were also provided for further evaluation. Evaluation of the solid organs and vasculature is limited in the absence of intravenous contrast. FINDINGS: LOWER CHEST: Please see same day chest CT for findings above the diaphragm. LIVER: Normal liver contour. No focal liver lesion on non-contrast examination. BILIARY: No intrahepatic biliary ductal dilatation.  The gallbladder is normal in appearance SPLEEN: Splenomegaly measuring up to 14.5 cm in craniocaudal dimension. Normal splenic contour. PANCREAS: Normal pancreatic contour without signs of inflammation or gross ductal dilatation. ADRENAL GLANDS: Normal appearance of the adrenal glands. KIDNEYS/URETERS: Smooth renal contours.  No nephrolithiasis.  No ureteral dilatation or collecting system distention. Mild bilateral perinephric soft tissue stranding, likely physiologic. BLADDER: Decompressed with Foley catheter in place. Small-volume gas within the bladder, secondary to instrumentation. REPRODUCTIVE ORGANS: Unremarkable. GI TRACT: Esophagogastric tube with tip in the gastric body. No focal gastric wall thickening. Duodenum is normal in course and caliber. No evidence of bowel obstruction. Mildly dilated colon with associated gaseous distention. No focal transition point. Mild circumferential wall thickening involving the distal sigmoid colon/rectum with adjacent soft tissue stranding, for reference (3:63). Normal appendix. PERITONEUM/RETROPERITONEUM AND MESENTERY: No free air. No fluid collection. VASCULATURE: Normal caliber aorta. [space for atherosclerosis] Otherwise, limited evaluation without contrast. LYMPH NODES: No adenopathy. BONES and SOFT TISSUES: No aggressive osseous lesions. Small bilateral fat-containing hernias. Postsurgical changes of the anterior abdominal wall. Multifocal soft tissue thickening in the superficial soft tissues of the anterior abdominal wall in the right lower quadrant (1:125), likely sequelae of injection.     --Mild circumferential wall thickening with adjacent soft tissue stranding involving the distal sigmoid colon/rectum. Findings are nonspecific although can be seen in the setting of early colitis/proctitis. --Additional chronic and incidental findings as detailed above. --Please see same day chest CT for findings above the diaphragm. ==================== ADDENDUM (03/16/2021 4:55 AM): On review, the following additional findings were noted: Agree with preliminary report above. In addition to findings suggestive of colitis/proctitis, there is notably liquid stool with air-fluid levels throughout the colon. This can be seen in diarrheal illness.    CT Chest Wo Contrast    Result Date: 03/16/2021  EXAM: CT CHEST WO CONTRAST DATE: 03/15/2021 5:24 PM ACCESSION: [id number] UN DICTATED: 03/15/2021 5:32 PM INTERPRETATION LOCATION: [facility] CLINICAL INDICATION: 40 years old Male with For management of infection, c/f empyema  COMPARISON: Concurrent CT abdomen and pelvis. CTA chest 12/28/2020. TECHNIQUE: Contiguous 0.6 or 1 mm and 2 mm axial images were reconstructed through the chest following a single breath hold helical  acquisition.  Images were reformatted in the axial and sagittal planes.  MIP slabs were also constructed. FINDINGS: LUNGS AND AIRWAYS: Moderate upper lobe predominant centrilobular emphysema. New consolidation with air bronchograms in the right upper lobe (2:37), and left lower lobe (2:74). Upper lobe predominant smooth interlobular septal thickening. There is additional nodularity and groundglass, for reference right lower lobe (2:73).  Endotracheal tube terminates in the thoracic trachea, above the carina. Small volume debris adjacent to the tip of the endotracheal tube. Cardiac bronchus (2:56). PLEURA: Small left pleural effusion without pleural thickening. No pneumothorax. MEDIASTINUM AND LYMPH NODES: Multiple mildly enlarged mediastinal lymph nodes, for reference 1.0 cm right paratracheal node (2:38). No enlarged axillary lymph nodes. Enteric tube courses below the diaphragm with tip terminating at the level of the gastric body. No other mediastinal abnormality. HEART AND VASCULATURE:The cardiac chambers are normal in size. Extensive left ventricular myocardial calcifications, unchanged. Trace pericardial effusion.  Ascending and descending aorta normal in caliber.  Pulmonary artery normal in size. BONES AND SOFT TISSUES: Unremarkable. UPPER ABDOMEN: Unremarkable.     1. Multifocal consolidative airspace disease is consistent with the concern for infectious pneumonia. 2. With regards to a potential empyema, the only notable findings include a nonspecific small pleural effusion in the dependent aspect of the left pleural space and trace loculated collections within the lateral aspect of the right chest (images 63 and 73, series 2). 3. Extensive calcification of the left ventricular myocardium is again evident.      XR Chest 12/7  Similar patchy and confluent consolidation in the right lung, greatest in the mid zone. Persistent left lower lung consolidation.   Small right pleural effusion. No pneumothorax.  Stable cardiomediastinal silhouette.      XR Chest 12/5 : Interval increase in right hemithorax consolidation. Persistent retrocardiac consolidation.No pleural effusion or pneumothorax    XR Chest 12/4: Slight increase in nonspecific left basilar opacities. Patchy and confluent heterogeneous opacities in the right upper lung are similar.      XR Chest Portable 03/06/2021  Marked interval worsening of airspace consolidation in the right upper and midlung zones. The left lung is clear.  No pleural effusion or pneumothorax.  Stable cardiomediastinal silhouette.    I have reviewed the images from 03/10/21 and agree with the findings.    Additional Studies:   (12/10) EKG QTc 427

## 2021-03-17 NOTE — Unmapped (Signed)
Respiratory Safety Check    Date: 03/17/2021     PPV Device at bedside:   [x]  Yes  []  Replaced  []  N/A    PPV mask at bedside:  [x]  Yes  []  Replaced  []  N/A    Checked humidifier, inspiratory limb, and expiratory limb:   [x]  Yes  []  Replaced  []  N/A    Backup trachs and supplies available at bedside:  []  Yes  []  Replaced  [x]  N/A    Documented trach airway form visible at bedside:   []  Yes  []  Replaced  [x]  N/A    iNO connected with PPV device:  []  Yes  []  Replaced  [x]  N/A

## 2021-03-17 NOTE — Unmapped (Signed)
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 *    Interval history: Adam [name] with PMHx of ALL, COPD, prior MRSA bacteremia requiring intubation, MDD, lower back pain, tobacco use hx, presenting with??nausea, vomiting, fever initially found to be hypotensive and tachycardic at oncologists office, started on linezolid.??SOB worsened??in subsequent 24hrs??prompting presentation to the ED.??Admitted to the MICU due to c/f??acute hypoxic respiratory failure secondary to influenza infection with superimposed PNA.??    24 hr inter: Patient prone yesterday after bronchoscopy, patient decreased FiO2 to 40% while prone, PEEP decreased to 10. Patient transferred to supine. Will attempt SBT given improvement. LRC resulted w/ Klebsiella resistant except for ertapenem.     Neurological   Sedation   Intubated secondary to worsening hypoxia, s/p vecuronium x1. Will increase analgesia and sedation given patient will prone for 24 hours after bronchoscopy.   -Discotninue Diluaidid ggt  - Continue PRN dilaudid 2q 2hrs  -Continue PRN ativan 2 mg q4h  - Decrease scheduled Ativan 2 q6hrs  - Continue schedule oxycodone to 15 q4hrs  - Continue gabapentin 100 q8hrs (dose reduced for renal function)    Chronic??Pain??- Peripheral neuropathy  Has chronic pain at baseline from??ALL. Follows with [facility].   - See above    MDD  Holding home clonazapam and nortriptyline   ??  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 Stable.     Pulmonary   AHRF 2/2 Influenza w/ superimposed MRSA PNA - s/p Intubation  S/p intubation 12/5 overnight. P/F ratio less than 150 currently . c/f ARDS given acute onset, bilateral infiltrates, low P/F continues to increase, no pulm edema. Patient continue to be on FiO2 40%. Given patients inability to decrease FiO2, and concern for other microbiological processes plan for bronchoscopy this PM for BAL. Proning patient helped improved oxygenation status, FiO2 decreased to 40%, PEEP decreased to 10. Attmepting SBT this PM and potentially extubating after.   - Continue Abx as indicated below   - Droplet Precautions??  - Follow serial ABGs to ensure appropriately compensating for acidosis (q6h)  - Consider CT chest w/o contrast if acute worsening of respiratory status, but otherwise imaging would not change current management.  ??  COPD  20 ppy tobacco use hx, followed by Pulmonology outpt. Per 01/25/21 no prior exacerbations. Utilizes home albuterol.   - O2 Goal 88-92%  - cont ANORO Ellipta  - Holding on steroids  - ABx as listed below  - prn duonebs  ??  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 Underwent unsuccessful thoracentesis, at the time referred to CT surgery, at the time didn't think warranted surgical intervention. POCUS attempted yesterday, did not show any  dense consolidations. Obtain Echo to assess for vegetations.   -TTE negative for vegetations.   -Possible CT chest w/o contrast today    Cardiovascular   NAI    Renal   AKI  Likely prerenal vs ATN. Has not resolved with increasing amounts of fluids so ATN more likely. Stable UOP. Creatinine downtrending  - Consult nephrology, appreciate recommendations   -CTM    Infectious Disease/Autoimmune   Influenza c/b MRSA, Klebsiella PNA - Hx of MRSA Bacteremia   MRSA screen positive, LRC growing MRSA. though BCx remain negative. CT chest obtained yesterday showed worsening multifocal consolidative airspace, with potential empyema given trace loculated collections with lateral aspect of the right chest. Given these finding patients will undergo bronscopy with BAL (with micro labs order below). Cell count showed 97% neutrophils. LRC from 12/11 grew klebsiella with sensitivities to ertapenem, meropenem, levofloxacin.   - Infectious disease Immunocomp consulted, follow reccs.  - Completed Tamiflu (12/3-12/7)  - Continue Linezolid  (12/3-???)  -Start Ertapenem 1g daily for 7 days  -Stop aztreonam 1g q8h after bronchoscopy   -Per ID If patient fevers obtain blood cultures  -F/u AFB, aspergillus, RPP, pneumocystis, CMV, lymphocytes, cell count, actinomyces, fungal , bronchial culture  - follow up BCx: NGTD at 48 days  Follow-up [facility]: Positive for staph aureus  - consider CT Chest to further eval lung pathology if O2 status does not improve    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  - continue home Valtrex 500mg  daily  - continue home Bactrium (normally BID Sat-Sun)  - holding TKI per heme reccs see below  ??  History of DRESS syndrome??2/2 cephalosporin  - consulted ICID for reccs re: abx regimen  - Avoid beta lactams    Cultures:  Blood Culture, Routine (no units)   Date Value   03/13/2021 No Growth at 72 hours   03/13/2021 No Growth at 72 hours     Urine Culture, Comprehensive (no units)   Date Value   03/06/2021 NO GROWTH     Lower Respiratory Culture (no units)   Date Value   03/14/2021 3+ Methicillin resistant Staphylococcus aureus (A)   03/14/2021 Remainder of Culture in Progress (A)     WBC (10*9/L)   Date Value   03/17/2021 14.1 (H)     WBC, UA (/HPF)   Date Value   03/14/2021 4 (H)          FEN/GI   Constipation-Possible residuals from tube feeds  Restarting tube feeds, will restart bowel regimen given now BM in last 24 hours  -Restart Lactulose 20g daily  -Cotninue Reglan 5 mg IV TID  -restart senna 1 tablets BID      Malnutrition Assessment: Not done yet.  Body mass index is 32.2 kg/m??.  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 ??He subsequently had difficulty tolerating single agent dasatinib. S/p 4 cycles of ponatinib-blinatumomab.??He is now MRD-negative and on ponatinib monotherapy  - heme on board: Recc holding home TKI until malignant hematology evals for ready to resume. Patient's family will need update to bring in home supply then.    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 IgG 632 in recheck      DVT PPx  - subcutaneous heparin 5000 units q 8hr    Endocrine   NAI  Integumentary   Skin Rash over anterior abdomen   Pictures in media tab. Suspect due to injury during proning. Stable  - Continue to monitor  - Wound care consult    - WOCN consulted for high risk skin assessment Yes.  - cont pressure mitigating precautions per skin policy    Prophylaxis/LDA/Restraints/Consults   Can CVC be removed? N/A, no CVC present (including vascular catheter for HD or PLEX)   Can A-line be removed? No: frequent ABGs  Can Foley be removed? No: Need continuous I/O  Mobility plan: Step 1 - Range of motion    Feeding: Tube feeds at goal  Analgesia: Pain adequately controlled, weaning  Sedation SAT/SBT: N/A  Thromboembolic ppx: SQ heparin  Head of bed >30 degrees: Yes  Ulcer ppx: Yes, mechanical ventilation > 3 days, not on tube feeds  Glucose within target range: Yes, in range    Does patient need/have an active type/screen? No    RASS at goal? Yes  [place] Agitation Assessment Scale (RASS) : -3 (03/17/2021  6:00 AM)     Can antipsychotics be stopped? N/A, not on antipsychotics  CAM-ICU Result: Negative (11/09/2020  9:00 PM)      Would hospice care be appropriate for this patient? No, patient improving or expected to improve  Any unaddressed hospice/palliative care needs? no    Patient Lines/Drains/Airways Status     Active Active Lines, Drains, & Airways     Name Placement date Placement time Site Days    ETT  7.5 03/07/21  2131  -- 9    NG/OG Tube Feedings Center mouth 03/08/21  0106  Center mouth  9    Urethral Catheter 03/07/21  2200  --  9    Peripheral IV 03/08/21 Right Antecubital 03/08/21  0102  Antecubital  9    Peripheral IV 03/17/21 Anterior;Left Forearm 03/17/21  1312  Forearm  less than 1    Arterial Line 03/07/21 Right Radial 03/07/21  1500  Radial  9              Patient Lines/Drains/Airways Status     Active Wounds     Name Placement date Placement time Site Days    Wound 03/09/21 Pressure Injury Nose Mid Stage 2 03/09/21  1005  Nose  8    Wound 03/09/21 Skin Tear Chest Left 03/09/21  1030  Chest  8    Wound 03/16/21 Skin Tear Back Lower;Right 03/16/21  0849  Back  1                Goals of Care     Code Status: Full Code    Designated Healthcare Decision Maker:  Mr. Holycross current decisional capacity for healthcare decision-making is Full capacity. His designated Educational psychologist) is/are   HCDM (patient stated preference): Jasman, Murri - Spouse - [phone].      Subjective     See 24 hr int    Objective     Vitals - past 24 hours  Temp:  [37.1 ??C (98.8 ??F)-39.1 ??C (102.4 ??F)] 37.1 ??C (98.8 ??F)  Heart Rate:  [98-121] 104  SpO2 Pulse:  [98-121] 104  Resp:  [16-28] 24  FiO2 (%):  [30 %-50 %] 40 %  SpO2:  [86 %-100 %] 95 % Intake/Output  I/O last 3 completed shifts:  In: 2139.4 [I.V.:89.4; NG/GT:820; IV Piggyback:1230]  Out: 3165 [Urine:3165]     Physical Exam:    General:??Appears comfortable, sedated in NAD   HEENT:??NCAT, reports no sinus tenderness.  Moist mucus membranes.  CV:??sinus tachycardia, no m/r/g. No clubbing  Pulm:??Ventilator breath sounds, 40% FiO2  GI:??Abdomen distended, soft, non-tender. Bowel sounds present.  MSK:??No rigidity. Diffuse myalgia. No LE swelling/size discrepancy.  Skin:??Unchanged Abrasion to left anterior chest wall, no crepitus, linear erythema on anterior abdomen  Neuro:??Sedated, not able to follow commands or track.      Continuous Infusions:   ??? sodium chloride         Scheduled Medications:   ??? alteplase  1 mg Intravenous Once   ??? carboxymethylcellulose sodium  2 drop Both Eyes Q6H [facility]   ??? chlorhexidine  15 mL Topical BID   ??? ertapenem  1 g Intravenous Q24H   ??? famotidine  20 mg Enteral tube: gastric  At bedtime   ??? gabapentin  100 mg Enteral tube: gastric  [facility]   ??? heparin (porcine) for subcutaneous use  5,000 Units Subcutaneous Q8H [facility]   ??? flu vacc qs2022-23 6mos up(PF)  0.5 mL Intramuscular During hospitalization   ??? ipratropium-albuteroL  3 mL Nebulization Q6H (RT)   ??? linezolid  600 mg Intravenous Q12H [facility]   ??? LORazepam  2 mg Intravenous Q4H [facility]   ??? melatonin  3 mg Enteral tube: gastric  QPM   ??? metoclopramide  5 mg Intravenous Q6H [facility]   ??? OLANZapine zydis  5 mg Sublingual Nightly   ??? ondansetron  8 mg Intravenous [facility]   ??? oxyCODONE  15 mg Enteral tube: gastric  Q4H [facility]   ??? polyethylene glycol  17 g Enteral tube: gastric  Daily   ??? polyethylene glycol  17 g Enteral tube: gastric  BID   ??? senna  1 tablet Enteral tube: gastric  BID   ??? sulfamethoxazole-trimethoprim  1 tablet Oral 2 times per day on Sun Sat   ??? valACYclovir  500 mg Oral Daily       PRN medications:  acetaminophen, acetaminophen **AND** diphenhydrAMINE, albuterol, bisacodyL, Implement **AND** Care order/instruction **AND** Vital signs **AND** sodium chloride **AND** sodium chloride 0.9% **AND** diphenhydrAMINE **AND** famotidine **AND** meperidine **AND** methylPREDNISolone sodium succinate (PF) **AND** EPINEPHrine, haloperidol lactate, HYDROmorphone, LORazepam, midazolam, ondansetron, senna    Data/Imaging Review: Reviewed in Epic and personally interpreted on 03/17/2021. See EMR for detailed results.

## 2021-03-17 NOTE — Unmapped (Signed)
VENOUS ACCESS TEAM PROCEDURE    Order was placed for a PIV by Venous Access Team (VAT).  Patient was assessed at bedside for placement of a PIV. PPE were donned per protocol.  Access was obtained. Blood return noted.  Dressing intact and device well secured.  Flushed with normal saline.  See LDA for details.  Pt advised to inform RN of any s/s of discomfort at the PIV site.    Workup / Procedure Time:  30 minutes        RN was notified.       Thank you,     Laurann Montana RN Venous Access Team

## 2021-03-17 NOTE — Unmapped (Signed)
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 *          Cross cover summary       - Prone positioning overnight  - Two episodes of oxygen desaturation when turning while the right lung was positioned up; resolved with repositioning and PRN agitation meds      Eppie Gibson, MD

## 2021-03-17 NOTE — Unmapped (Addendum)
Pt swim/No distress or complications noted

## 2021-03-17 NOTE — Unmapped (Signed)
Pt swim/no distress or complications noted

## 2021-03-17 NOTE — Unmapped (Signed)
Patient remains intubated and sedated. Opens eyes to pain and moves all extremities when stimulated. Multiple PRN doses needed to tolerate any turn or movement. MD aware. Remains proned. ABG's trended and FiO2 titrated accordingly. See Flowsheets. 2 episodes of desaturations overnight. Primarily after being positioned with R Lung elevated. PRN doses given and patient repositions with good effect. Febrile this AM. Tylenol given and cooling blanket re-applied. Remains normal sinus to sinus tach. BP stable. 1 BM this shift. Tube feeds remain on hold per MD. Good foley output. Bath complete. Wife at bedside throughout shift and updated.   Problem: Adult Inpatient Plan of Care  Goal: Plan of Care Review  Outcome: Ongoing - Unchanged  Goal: Patient-Specific Goal (Individualized)  Outcome: Ongoing - Unchanged  Goal: Absence of Hospital-Acquired Illness or Injury  Outcome: Ongoing - Unchanged  Intervention: Identify and Manage Fall Risk  Recent Flowsheet Documentation  Taken 03/17/2021 0600 by Ocie Cornfield, RN  Safety Interventions:   aspiration precautions   bed alarm   bleeding precautions  Taken 03/17/2021 0400 by Ocie Cornfield, RN  Safety Interventions:   aspiration precautions   bleeding precautions   fall reduction program maintained   family at bedside  Taken 03/17/2021 0200 by Ocie Cornfield, RN  Safety Interventions:   aspiration precautions   family at bedside  Taken 03/17/2021 0000 by Ocie Cornfield, RN  Safety Interventions:   aspiration precautions   bed alarm   bleeding precautions  Taken 03/16/2021 2200 by Ocie Cornfield, RN  Safety Interventions:   aspiration precautions   bed alarm   bleeding precautions  Taken 03/16/2021 2000 by Ocie Cornfield, RN  Safety Interventions:   aspiration precautions   bleeding precautions  Intervention: Prevent and Manage VTE (Venous Thromboembolism) Risk  Recent Flowsheet Documentation  Taken 03/17/2021 0600 by Ocie Cornfield, RN  Activity Management: bedrest  Taken 03/17/2021 0400 by Ocie Cornfield, RN  Activity Management: bedrest  Taken 03/17/2021 0200 by Ocie Cornfield, RN  Activity Management: bedrest  Taken 03/17/2021 0000 by Ocie Cornfield, RN  Activity Management: bedrest  VTE Prevention/Management: (SQ heparin) other (see comments)  Taken 03/16/2021 2200 by Ocie Cornfield, RN  Activity Management: bedrest  Taken 03/16/2021 2000 by Ocie Cornfield, RN  Activity Management: bedrest  Intervention: Prevent Infection  Recent Flowsheet Documentation  Taken 03/17/2021 0600 by Ocie Cornfield, RN  Infection Prevention:   environmental surveillance performed   hand hygiene promoted   rest/sleep promoted  Taken 03/17/2021 0400 by Ocie Cornfield, RN  Infection Prevention:   environmental surveillance performed   equipment surfaces disinfected   hand hygiene promoted   personal protective equipment utilized   rest/sleep promoted   single patient room provided   visitors restricted/screened  Taken 03/17/2021 0200 by Ocie Cornfield, RN  Infection Prevention:   equipment surfaces disinfected   hand hygiene promoted   personal protective equipment utilized   single patient room provided   rest/sleep promoted  Taken 03/17/2021 0000 by Ocie Cornfield, RN  Infection Prevention:   environmental surveillance performed   equipment surfaces disinfected   hand hygiene promoted   personal protective equipment utilized   rest/sleep promoted   single patient room provided   visitors restricted/screened  Taken 03/16/2021 2200 by Ocie Cornfield, RN  Infection Prevention:   equipment surfaces disinfected   hand hygiene promoted   personal protective equipment utilized   rest/sleep promoted   single patient room provided   visitors restricted/screened  Taken 03/16/2021 2000 by Ocie Cornfield, RN  Infection Prevention:   equipment  surfaces disinfected   hand hygiene promoted   personal protective equipment utilized   rest/sleep promoted   single patient room provided   visitors restricted/screened   environmental surveillance performed  Goal: Optimal Comfort and Wellbeing  Outcome: Ongoing - Unchanged  Goal: Readiness for Transition of Care  Outcome: Ongoing - Unchanged  Goal: Rounds/Family Conference  Outcome: Ongoing - Unchanged     Problem: Infection  Goal: Absence of Infection Signs and Symptoms  Outcome: Ongoing - Unchanged  Intervention: Prevent or Manage Infection  Recent Flowsheet Documentation  Taken 03/17/2021 0600 by Ocie Cornfield, RN  Infection Management: aseptic technique maintained  Isolation Precautions: contact precautions maintained  Taken 03/17/2021 0400 by Ocie Cornfield, RN  Infection Management: aseptic technique maintained  Isolation Precautions: contact precautions maintained  Taken 03/17/2021 0200 by Ocie Cornfield, RN  Infection Management: aseptic technique maintained  Isolation Precautions: contact precautions maintained  Taken 03/17/2021 0000 by Ocie Cornfield, RN  Infection Management: aseptic technique maintained  Isolation Precautions: contact precautions maintained  Taken 03/16/2021 2200 by Ocie Cornfield, RN  Infection Management: aseptic technique maintained  Isolation Precautions: contact precautions maintained  Taken 03/16/2021 2000 by Ocie Cornfield, RN  Infection Management: aseptic technique maintained  Isolation Precautions: contact precautions maintained     Problem: Skin Injury Risk Increased  Goal: Skin Health and Integrity  Outcome: Ongoing - Unchanged  Intervention: Optimize Skin Protection  Recent Flowsheet Documentation  Taken 03/17/2021 0600 by Ocie Cornfield, RN  Pressure Reduction Techniques: weight shift assistance provided  Head of Bed [facility]) Positioning: (prone) HOB flat  Pressure Reduction Devices: positioning supports utilized  Taken 03/17/2021 0400 by Ocie Cornfield, RN  Head of Bed [facility]) Positioning: HOB flat  Taken 03/17/2021 0200 by Ocie Cornfield, RN  Head of Bed [facility]) Positioning: reverse trandelenberg  Taken 03/17/2021 0000 by Ocie Cornfield, RN  Head of Bed [facility]) Positioning: HOB flat  Taken 03/16/2021 2200 by Ocie Cornfield, RN  Head of Bed [facility]) Positioning: reverse trandelenberg  Taken 03/16/2021 2000 by Ocie Cornfield, RN  Pressure Reduction Techniques: weight shift assistance provided  Head of Bed ([facility]OB) Positioning: reverse trandelenberg  Pressure Reduction Devices: positioning supports utilized     Problem: Fluid Imbalance (Pneumonia)  Goal: Fluid Balance  Outcome: Ongoing - Unchanged     Problem: Infection (Pneumonia)  Goal: Resolution of Infection Signs and Symptoms  Outcome: Ongoing - Unchanged  Intervention: Prevent Infection Progression  Recent Flowsheet Documentation  Taken 03/17/2021 0600 by Ocie Cornfield, RN  Infection Management: aseptic technique maintained  Isolation Precautions: contact precautions maintained  Taken 03/17/2021 0400 by Ocie Cornfield, RN  Infection Management: aseptic technique maintained  Isolation Precautions: contact precautions maintained  Taken 03/17/2021 0200 by Ocie Cornfield, RN  Infection Management: aseptic technique maintained  Isolation Precautions: contact precautions maintained  Taken 03/17/2021 0000 by Ocie Cornfield, RN  Infection Management: aseptic technique maintained  Isolation Precautions: contact precautions maintained  Taken 03/16/2021 2200 by Ocie Cornfield, RN  Infection Management: aseptic technique maintained  Isolation Precautions: contact precautions maintained  Taken 03/16/2021 2000 by Ocie Cornfield, RN  Infection Management: aseptic technique maintained  Isolation Precautions: contact precautions maintained     Problem: Respiratory Compromise (Pneumonia)  Goal: Effective Oxygenation and Ventilation  Outcome: Ongoing - Unchanged  Intervention: Optimize Oxygenation and Ventilation  Recent Flowsheet Documentation  Taken 03/17/2021 0600 by Ocie Cornfield, RN  Head of Bed [facility]) Positioning: (prone) HOB flat  Taken 03/17/2021 0400 by Ocie Cornfield, RN  Head of Bed [facility]) Positioning: HOB flat  Taken 03/17/2021  0200 by Ocie Cornfield, RN  Head of Bed [facility]) Positioning: reverse trandelenberg  Taken 03/17/2021 0000 by Ocie Cornfield, RN  Head of Bed [facility]) Positioning: HOB flat  Taken 03/16/2021 2200 by Ocie Cornfield, RN  Head of Bed [facility]) Positioning: reverse trandelenberg  Taken 03/16/2021 2000 by Ocie Cornfield, RN  Head of Bed [facility]) Positioning: reverse trandelenberg     Problem: Pain Chronic (Persistent) (Comorbidity Management)  Goal: Acceptable Pain Control and Functional Ability  Outcome: Ongoing - Unchanged     Problem: Self-Care Deficit  Goal: Improved Ability to Complete Activities of Daily Living  Outcome: Ongoing - Unchanged     Problem: Communication Impairment (Mechanical Ventilation, Invasive)  Goal: Effective Communication  Outcome: Ongoing - Unchanged     Problem: Device-Related Complication Risk (Mechanical Ventilation, Invasive)  Goal: Optimal Device Function  Outcome: Ongoing - Unchanged  Intervention: Optimize Device Care and Function  Recent Flowsheet Documentation  Taken 03/17/2021 0600 by Ocie Cornfield, RN  Aspiration Precautions:   upright posture maintained   respiratory status monitored  Taken 03/17/2021 0400 by Ocie Cornfield, RN  Aspiration Precautions: respiratory status monitored  Taken 03/17/2021 0200 by Ocie Cornfield, RN  Aspiration Precautions:   upright posture maintained   tube feeding placement verified   respiratory status monitored   oral hygiene care promoted  Taken 03/17/2021 0000 by Ocie Cornfield, RN  Aspiration Precautions:   upright posture maintained   oral hygiene care promoted   tube feeding placement verified  Taken 03/16/2021 2200 by Ocie Cornfield, RN  Aspiration Precautions:   oral hygiene care promoted   upright posture maintained   tube feeding placement verified   respiratory status monitored  Taken 03/16/2021 2000 by Ocie Cornfield, RN  Aspiration Precautions:   tube feeding placement verified   upright posture maintained   respiratory status monitored   oral hygiene care promoted     Problem: Inability to Wean (Mechanical Ventilation, Invasive)  Goal: Mechanical Ventilation Liberation  Outcome: Ongoing - Unchanged     Problem: Nutrition Impairment (Mechanical Ventilation, Invasive)  Goal: Optimal Nutrition Delivery  Outcome: Ongoing - Unchanged     Problem: Skin and Tissue Injury (Mechanical Ventilation, Invasive)  Goal: Absence of Device-Related Skin and Tissue Injury  Outcome: Ongoing - Unchanged     Problem: Ventilator-Induced Lung Injury (Mechanical Ventilation, Invasive)  Goal: Absence of Ventilator-Induced Lung Injury  Outcome: Ongoing - Unchanged  Intervention: Prevent Ventilator-Associated Pneumonia  Recent Flowsheet Documentation  Taken 03/17/2021 0600 by Ocie Cornfield, RN  Head of Bed [facility]) Positioning: (prone) HOB flat  Taken 03/17/2021 0400 by Ocie Cornfield, RN  Head of Bed [facility]) Positioning: HOB flat  Oral Care:   mouth swabbed   suction provided  Suction Type: Oral  Taken 03/17/2021 0200 by Ocie Cornfield, RN  Head of Bed [facility]) Positioning: reverse trandelenberg  Taken 03/17/2021 0000 by Ocie Cornfield, RN  Head of Bed [facility]) Positioning: HOB flat  Oral Care:   lip/mouth moisturizer applied   mouth swabbed   suction provided  Suction Type: Oral  Taken 03/16/2021 2200 by Ocie Cornfield, RN  Head of Bed [facility]) Positioning: reverse trandelenberg  Taken 03/16/2021 2000 by Ocie Cornfield, RN  Head of Bed [facility]) Positioning: reverse trandelenberg  Oral Care:   mouth swabbed   suction provided  Suction Type: Oral     Problem: Impaired Wound Healing  Goal: Optimal Wound Healing  Outcome: Ongoing - Unchanged  Intervention: Promote Wound Healing  Recent Flowsheet Documentation  Taken 03/17/2021 0600 by Ocie Cornfield, RN  Activity Management: bedrest  Taken 03/17/2021 0400 by Ocie Cornfield, RN  Activity Management: bedrest  Taken 03/17/2021 0200 by Ocie Cornfield, RN  Activity Management: bedrest  Taken 03/17/2021 0000 by Ocie Cornfield, RN  Activity Management: bedrest  Taken 03/16/2021 2200 by Ocie Cornfield, RN  Activity Management: bedrest  Taken 03/16/2021 2000 by Ocie Cornfield, RN  Activity Management: bedrest     Problem: Fall Injury Risk  Goal: Absence of Fall and Fall-Related Injury  Outcome: Ongoing - Unchanged  Intervention: Promote Injury-Free Environment  Recent Flowsheet Documentation  Taken 03/17/2021 0600 by Ocie Cornfield, RN  Safety Interventions:   aspiration precautions   bed alarm   bleeding precautions  Taken 03/17/2021 0400 by Ocie Cornfield, RN  Safety Interventions:   aspiration precautions   bleeding precautions   fall reduction program maintained   family at bedside  Taken 03/17/2021 0200 by Ocie Cornfield, RN  Safety Interventions:   aspiration precautions   family at bedside  Taken 03/17/2021 0000 by Ocie Cornfield, RN  Safety Interventions:   aspiration precautions   bed alarm   bleeding precautions  Taken 03/16/2021 2200 by Ocie Cornfield, RN  Safety Interventions:   aspiration precautions   bed alarm   bleeding precautions  Taken 03/16/2021 2000 by Ocie Cornfield, RN  Safety Interventions:   aspiration precautions   bleeding precautions

## 2021-03-17 NOTE — Unmapped (Signed)
Nephrology Consult Follow up Note    Requesting Attending Physician :  [name], MD  Service Requesting Consult : Medical ICU (MDI)    Reason for Consult: AKI    Interval history/subjective  Patient still intubated but does not require vasopressor requirements  Awake and able to follow simple commands.  Creatinine slowly improving with robust urine output of 2 L in 24 hours with no diuretic augmentation    Assessment/Recommendations: Adam 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  Nephrology following for  AKI 2/2 ATN    # Non-oliguric AKI on CKD  2/2 ATN - improving   - Baseline cr of 1.6-1.9 in recent months, peaked at 3.85 on 03/12/2021 and seems to have plateaued and slowly improving with robust urine output of 2 L in 24 hours with no diuretic augmentation  -  Urine sediment previously done showed  many muddy brown casts and few scattered WBCs, consistent with ATN, likely from hypotension.   - maintain MAP > 65  - No acute indications for dialysis at this time  - Strict I/O's  - Avoid nephrotoxic drugs including but not limited to NSAIDs, contrasted studies, and fleets enemas   - Dose all meds for creatinine clearance   - Unless absolutely necessary, no MRIs with gadolinium given potential risk for NSF.     Thank you for this consult.  Nephrology will sign off for now.   Please page the renal fellow on call with questions/concerns.    Patient was seen and evaluated by Dr. Toni Arthurs who agrees with this assessment and plan.     Medications:   ??? alteplase  1 mg Intravenous Once   ??? carboxymethylcellulose sodium  2 drop Both Eyes Q6H [facility]   ??? chlorhexidine  15 mL Topical BID   ??? ertapenem  1 g Intravenous Daily   ??? famotidine  20 mg Enteral tube: gastric  At bedtime   ??? gabapentin  100 mg Enteral tube: gastric  [facility]   ??? heparin (porcine) for subcutaneous use  5,000 Units Subcutaneous Q8H [facility]   ??? flu vacc qs2022-23 6mos up(PF)  0.5 mL Intramuscular During hospitalization   ??? ipratropium-albuteroL  3 mL Nebulization Q6H (RT)   ??? linezolid  600 mg Intravenous Q12H [facility]   ??? LORazepam  2 mg Intravenous Q6H   ??? melatonin  3 mg Enteral tube: gastric  QPM   ??? metoclopramide  5 mg Intravenous Q6H SCH   ??? OLANZapine zydis  5 mg Sublingual Nightly   ??? ondansetron  8 mg Intravenous [facility]   ??? oxyCODONE  15 mg Enteral tube: gastric  Q4H SCH   ??? polyethylene glycol  17 g Enteral tube: gastric  Daily   ??? polyethylene glycol  17 g Enteral tube: gastric  BID   ??? senna  1 tablet Enteral tube: gastric  BID   ??? sulfamethoxazole-trimethoprim  1 tablet Oral 2 times per day on Sun Sat   ??? valACYclovir  500 mg Oral Daily        ALLERGIES  Bupropion hcl, Cefepime, Ceftaroline fosamil, Dapsone, Onion, Vancomycin analogues, Bismuth subsalicylate, and Furosemide    MEDICAL HISTORY  Past Medical History:   Diagnosis Date   ??? 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 Marital status: Married     Spouse name: None   ??? Number of children: None   ??? Years of education: None   ??? Highest education level: None   Tobacco Use   ??? Smoking status: Former     Packs/day: 2.00     Types: Cigarettes     Quit date: 01/20/2020     Years since quitting: 1.1   ??? Smokeless tobacco: Former     Types: Financial planner   ??? Vaping Use: Never used   Other Topics Concern   ??? Do you use sunscreen? No   ??? Tanning bed use? No   ??? Are you easily burned? Yes   ??? Excessive sun exposure? No   ??? Blistering sunburns? Yes     Social Determinants of Health     Financial Resource Strain: Low Risk    ??? Difficulty of Paying Living Expenses: Not very hard   Food Insecurity: No Food Insecurity   ??? Worried About Running Out of Food in the Last Year: Never true   ??? Ran Out of Food in the Last Year: Never true Transportation Needs: No Transportation Needs   ??? Lack of Transportation (Medical): No   ??? Lack of Transportation (Non-Medical): No        FAMILY HISTORY  Family History   Problem Relation Age of Onset   ??? Melanoma Neg Hx    ??? Basal cell carcinoma Neg Hx    ??? Squamous cell carcinoma Neg Hx         Code Status:  Full Code    Review of Systems:  Review of systems was unobtainable due to patient factors. intubated      Physical Exam:  Vitals:    03/17/21 1410   BP:    Pulse: 104   Resp: 25   Temp:    SpO2: 96%     I/O this shift:  In: 81 [I.V.:42; NG/GT:40]  Out: 600 [Urine:600]    Intake/Output Summary (Last 24 hours) at 03/17/2021 1510  Last data filed at 03/17/2021 1200  Gross per 24 hour   Intake 1551.38 ml   Output 1900 ml   Net -348.62 ml       CONSTITUTIONAL: intubated, awake  CARDIOVASCULAR: Regular, normal S1/S2 heart sounds, no murmurs, no rubs.   PULM: Clear to auscultation bilaterally with ventilator sounds  GASTROINTESTINAL: Soft, active bowel sounds, nontender  EXTREMITIES: No lower extremity edema bilaterally.   NEUROLOGIC: able to follow simple commands    LABS    Creatinine Trend  Lab Results   Component Value Date    Creatinine 1.89 (H) 03/17/2021    Creatinine 2.02 (H) 03/17/2021    Creatinine 2.02 (H) 03/16/2021    Creatinine 2.07 (H) 03/16/2021    Creatinine 2.25 (H) 03/15/2021    Creatinine 2.30 (H) 03/15/2021    Creatinine 2.14 (H) 03/14/2021    Creatinine 2.36 (H) 03/14/2021    Creatinine 2.75 (H) 03/13/2021    Creatinine 3.13 (H) 03/13/2021    Creatinine 3.46 (H) 03/12/2021    Creatinine 3.85 (H) 03/12/2021    Creatinine 3.72 (H) 03/11/2021    Creatinine 3.40 (H) 03/11/2021    Creatinine 2.88 (H) 03/10/2021    Creatinine 2.79 (H) 03/10/2021    Creatinine 2.45 (H) 03/09/2021    Creatinine 2.64 (H) 03/09/2021    Creatinine 2.69 (H) 03/08/2021    Creatinine 2.44 (H)  03/08/2021        Lab Results   Component Value Date    Color, UA Light Yellow 03/14/2021    Specific Gravity, UA 1.011 03/14/2021 pH, UA 5.5 03/14/2021    Protein, UA 30 mg/dL (A) 16/01/9603    Glucose, UA Negative 03/14/2021    Ketones, UA Negative 03/14/2021    Blood, UA Trace (A) 03/14/2021    Nitrite, UA Negative 03/14/2021    Leukocyte Esterase, UA Negative 03/14/2021    Urobilinogen, UA <2.0 mg/dL 54/12/8117    Bilirubin, UA Negative 03/14/2021       Lab Results   Component Value Date    Sodium, Ur <10 03/11/2021    Urea Nitrogen, Ur 637 03/11/2021    Creat U 131.2 03/11/2021    Protein/Creatinine Ratio, Urine 1.279 02/20/2020        Lab Results   Component Value Date    Sodium, Ur <10 03/11/2021    Sodium, Ur 10 11/02/2020    Sodium, Ur 53 02/09/2020        Lab Results   Component Value Date    Sodium 150 (H) 03/17/2021    Sodium Whole Blood 137 03/11/2021    Potassium 4.2 03/17/2021    Potassium, Bld 4.0 03/11/2021    Chloride 116 (H) 03/17/2021    CO2 23.0 03/17/2021    BUN 38 (H) 03/17/2021    Creatinine 1.89 (H) 03/17/2021     Lab Results   Component Value Date    Calcium 8.6 (L) 03/17/2021    Magnesium 1.7 03/17/2021    Phosphorus 3.7 03/17/2021    Albumin 2.1 (L) 03/17/2021        Lab Results   Component Value Date    WBC 14.1 (H) 03/17/2021    HGB 8.7 (L) 03/17/2021    Hemoglobin 9.3 (L) 03/11/2021    HCT 27.4 (L) 03/17/2021    Platelet 289 03/17/2021        IMAGING STUDIES:  ECG 12 Lead    Result Date: 03/14/2021  SINUS TACHYCARDIA POSSIBLE LEFT ATRIAL ENLARGEMENT BORDERLINE ECG WHEN COMPARED WITH ECG OF 06-Mar-2021 13:39, NO SIGNIFICANT CHANGE WAS FOUND Confirmed by Vickey Huger [phone]) on 03/14/2021 9:54:35 PM    CT Abdomen Pelvis Wo Contrast    Result Date: 03/16/2021  EXAM: CT ABDOMEN PELVIS WO CONTRAST DATE: 03/15/2021 5:24 PM ACCESSION: [id number] UN DICTATED: 03/15/2021 5:42 PM INTERPRETATION LOCATION: [facility]     CLINICAL INDICATION: 40 year old with Infectious concern      COMPARISON: Abdominal CT from 02/13/2020     TECHNIQUE: A spiral CT scan was obtained without IV contrast from the lung bases to the pubic symphysis.  Images were reconstructed in the axial plane. Coronal and sagittal reformatted images were also provided for further evaluation.     Evaluation of the solid organs and vasculature is limited in the absence of intravenous contrast.         FINDINGS:     LOWER CHEST: Please see same day chest CT for findings above the diaphragm.     LIVER: Normal liver contour. No focal liver lesion on non-contrast examination.     BILIARY: No intrahepatic biliary ductal dilatation.  The gallbladder is normal in appearance     SPLEEN: Splenomegaly measuring up to 14.5 cm in craniocaudal dimension. Normal splenic contour.     PANCREAS: Normal pancreatic contour without signs of inflammation or gross ductal dilatation.     ADRENAL GLANDS: Normal appearance of the adrenal glands.  KIDNEYS/URETERS: Smooth renal contours.  No nephrolithiasis.  No ureteral dilatation or collecting system distention. Mild bilateral perinephric soft tissue stranding, likely physiologic.     BLADDER: Decompressed with Foley catheter in place. Small-volume gas within the bladder, secondary to instrumentation.     REPRODUCTIVE ORGANS: Unremarkable.     GI TRACT: Esophagogastric tube with tip in the gastric body. No focal gastric wall thickening. Duodenum is normal in course and caliber. No evidence of bowel obstruction. Mildly dilated colon with associated gaseous distention. No focal transition point. Mild circumferential wall thickening involving the distal sigmoid colon/rectum with adjacent soft tissue stranding, for reference (3:63). Normal appendix.     PERITONEUM/RETROPERITONEUM AND MESENTERY: No free air. No fluid collection.     VASCULATURE: Normal caliber aorta. [space for atherosclerosis] Otherwise, limited evaluation without contrast.     LYMPH NODES: No adenopathy.     BONES and SOFT TISSUES: No aggressive osseous lesions. Small bilateral fat-containing hernias. Postsurgical changes of the anterior abdominal wall. Multifocal soft tissue thickening in the superficial soft tissues of the anterior abdominal wall in the right lower quadrant (1:125), likely sequelae of injection.         --Mild circumferential wall thickening with adjacent soft tissue stranding involving the distal sigmoid colon/rectum. Findings are nonspecific although can be seen in the setting of early colitis/proctitis.     --Additional chronic and incidental findings as detailed above.     --Please see same day chest CT for findings above the diaphragm.     ==================== ADDENDUM (03/16/2021 4:55 AM): On review, the following additional findings were noted:     Agree with preliminary report above. In addition to findings suggestive of colitis/proctitis, there is notably liquid stool with air-fluid levels throughout the colon. This can be seen in diarrheal illness.    XR Chest Portable    Result Date: 03/17/2021  EXAM: XR CHEST PORTABLE DATE: 03/17/2021 10:47 AM ACCESSION: [id number] UN DICTATED: 03/17/2021 11:09 AM INTERPRETATION LOCATION: [facility]     CLINICAL INDICATION: 40 years old Male with HYPOXEMIA      TECHNIQUE: Portable Chest Radiograph.     COMPARISON: 03/10/2021 and priors.     FINDINGS:     MEDICAL DEVICES: Interval right IJ central venous catheter has been removed. Endotracheal tube and NG/OG tube unchanged.     LUNGS AND PLEURA: Similar right midlung airspace consolidation. Left lung is clear. No pleural effusion. No pneumothorax.     HEART AND MEDIASTINUM: Heart is normal in size. Normal thoracic aorta.     BONES AND SOFT TISSUES: No acute  osseous abnormalities.                 *Stable right midlung airspace consolidation consistent with pneumonia since prior, improving since December 5.    CT Chest Wo Contrast    Result Date: 03/16/2021  EXAM: CT CHEST WO CONTRAST DATE: 03/15/2021 5:24 PM ACCESSION: [id number] UN DICTATED: 03/15/2021 5:32 PM INTERPRETATION LOCATION: [facility]     CLINICAL INDICATION: 40 years old Male with For management of infection, c/f empyema      COMPARISON: Concurrent CT abdomen and pelvis. CTA chest 12/28/2020.     TECHNIQUE: Contiguous 0.6 or 1 mm and 2 mm axial images were reconstructed through the chest following a single breath hold helical acquisition.  Images were reformatted in the axial and sagittal planes.  MIP slabs were also constructed.     FINDINGS:     LUNGS AND AIRWAYS: Moderate upper lobe predominant centrilobular emphysema. New consolidation with  air bronchograms in the right upper lobe (2:37), and left lower lobe (2:74). Upper lobe predominant smooth interlobular septal thickening. There is additional nodularity and groundglass, for reference right lower lobe (2:73).  Endotracheal tube terminates in the thoracic trachea, above the carina. Small volume debris adjacent to the tip of the endotracheal tube. Cardiac bronchus (2:56).     PLEURA: Small left pleural effusion without pleural thickening. No pneumothorax.     MEDIASTINUM AND LYMPH NODES: Multiple mildly enlarged mediastinal lymph nodes, for reference 1.0 cm right paratracheal node (2:38). No enlarged axillary lymph nodes. Enteric tube courses below the diaphragm with tip terminating at the level of the gastric body. No other mediastinal abnormality.     HEART AND VASCULATURE:The cardiac chambers are normal in size. Extensive left ventricular myocardial calcifications, unchanged. Trace pericardial effusion.  Ascending and descending aorta normal in caliber.  Pulmonary artery normal in size.     BONES AND SOFT TISSUES: Unremarkable.     UPPER ABDOMEN: Unremarkable.                 1. Multifocal consolidative airspace disease is consistent with the concern for infectious pneumonia.     2. With regards to a potential empyema, the only notable findings include a nonspecific small pleural effusion in the dependent aspect of the left pleural space and trace loculated collections within the lateral aspect of the right chest (images 63 and 73, series 2).     3. Extensive calcification of the left ventricular myocardium is again evident.             Echocardiogram Follow Up/Limited Echo With Contrast    Result Date: 03/15/2021  Patient Info Name:     MONTREY DAVID Wampole Age:     40 years DOB:     1981/03/17 Gender:     Male MRN:     [id number] Accession #:     [id number] UN Ht:     183 cm Wt:     110 kg BSA:     2.39 m2 BP:     139 /     71 mmHg Technical Quality:     Poor Exam Date:     03/15/2021 8:13 AM Site Location:     [facility] Exam Location:     [facility] Admit Date:     03/06/2021     Exam Type:     ECHOCARDIOGRAM FOLLOW UP/LIMITED ECHO WITH CONTRAST     Study Info Indications      - r/u endocarditis     Limited 2D and color flow transthoracic echocardiogram is performed with contrast to opacify the left ventricle and to improve the delineation of the left ventricle endocardial borders.     Staff Referring Physician:     [id number], Jerrol Banana; Reading Fellow:     Merrily Pew MD Sonographer:     Earnest Conroy RDCS, RVT Ordering Physician:     Bernita Buffy     Account #:     [id number]     Ultrasound Enhancing Agent/Agitated Saline     ------------------------------ UEA/Ag. Saline:     Lumason Amount:     4.00 ml Administered By:     Earnest Conroy RDCS,  RVT Existing IV Access:     Yes IV Access Condition:     patent with no signs of infiltration     Reason for Poor Study:     poor echocardiographic windows         Summary   1. Technically  difficult study.   2. The left ventricle is normal in size with normal wall thickness.   3. The left ventricular systolic function is normal, LVEF is visually estimated at > 55%.   4. The right ventricle is not well visualized but probably normal in size.   5. No apparent valvular or endocardial vegetation, however there is suboptimal valvular visualization.     Recommendations   * If there is ongoing clinical concern for endocarditis, transesophageal imaging can be considered for further evaluation.         Left Ventricle   The left ventricle is normal in size with normal wall thickness.   The left ventricular systolic function is normal, LVEF is visually estimated at > 55%.   There is normal left ventricular diastolic function.     Right Ventricle   The right ventricle is not well visualized but probably normal in size.         Left Atrium   The left atrium is not well visualized but probably normal in size.     Right Atrium   The right atrium is not well visualized but probably normal  in size.         Aortic Valve   The aortic valve is not well visualized.   There is no significant aortic regurgitation.     Pulmonic Valve   Pulmonary valve is not well visualized.     Mitral Valve   The mitral valve is not well visualized.   The mitral valve leaflets are normal with normal leaflet mobility.   There is no significant mitral valve regurgitation.     Tricuspid Valve   The tricuspid valve is not well visualized.         Pericardium/Pleural   There is no pericardial effusion.         Mitral Valve ---------------------------------------------------------------------- Name                                 Value        Normal ----------------------------------------------------------------------     MV Diastolic Function ---------------------------------------------------------------------- MV E Peak Velocity                 65 cm/s               MV A Peak Velocity                 53 cm/s               MV E/A                                 1.2                   MV Annular TDI ---------------------------------------------------------------------- MV Septal e' Velocity            15.1 cm/s         >=8.0 MV Lateral e' Velocity            7.3 cm/s        >=10.0 MV e' Average                         11.2               MV E/e' (Average)  6.6     Ventricles ---------------------------------------------------------------------- Name                                 Value        Normal ----------------------------------------------------------------------     LV Dimensions 2D/MM ---------------------------------------------------------------------- IVS Diastolic Thickness (2D)        0.8 cm       0.6-1.0 LVID Diastole (2D)                  4.8 cm       4.2-5.8 LVPW Diastolic Thickness (2D)                                0.9 cm       0.6-1.0 LVID Systole (2D)                   3.1 cm       2.5-4.0         Report Signatures Finalized by Yaakov Guthrie  MD on 03/15/2021 01:44 PM Resident [name]  MD on 03/15/2021 11:07 AM    XR Abdomen 1 View    Result Date: 03/17/2021  EXAM: XR ABDOMEN 1 VIEW DATE: 03/17/2021 1:20 PM ACCESSION: [id number] UN DICTATED: 03/17/2021 1:39 PM INTERPRETATION LOCATION: [facility]     CLINICAL INDICATION: 40 years old Male with NGT (CATHETER VASCULAR FIT & ADJ)      TECHNIQUE: Supine view of the abdomen was obtained.      COMPARISON: 03/15/2021 CT abdomen and pelvis.     FINDINGS:     Nonweighted esophagogastric tube with tip and side-port overlying the left upper quadrant. Weighted esophagogastric tube with tip overlying the left upper quadrant.     Gas is seen in nondilated large and small bowel. There is no evidence of obstruction. No abnormal soft tissue masses or calcifications are noted. No acute osseous abnormality is identified.     Visualized lung bases are better evaluated on earlier same day chest radiograph.         -- Weighted and nonweighted esophagogastric tubes with tips overlying the gastric body.

## 2021-03-18 LAB — BLOOD GAS, ARTERIAL
BASE EXCESS ARTERIAL: -3.4 — ABNORMAL LOW (ref -2.0–2.0)
BASE EXCESS ARTERIAL: -3.9 — ABNORMAL LOW (ref -2.0–2.0)
BASE EXCESS ARTERIAL: -5.5 — ABNORMAL LOW (ref -2.0–2.0)
HCO3 ARTERIAL: 19 mmol/L — ABNORMAL LOW (ref 22–27)
HCO3 ARTERIAL: 21 mmol/L — ABNORMAL LOW (ref 22–27)
HCO3 ARTERIAL: 21 mmol/L — ABNORMAL LOW (ref 22–27)
O2 SATURATION ARTERIAL: 91.8 % — ABNORMAL LOW (ref 94.0–100.0)
O2 SATURATION ARTERIAL: 97.6 % (ref 94.0–100.0)
O2 SATURATION ARTERIAL: 97.9 % (ref 94.0–100.0)
PCO2 ARTERIAL: 32.6 mmHg — ABNORMAL LOW (ref 35.0–45.0)
PCO2 ARTERIAL: 34.5 mmHg — ABNORMAL LOW (ref 35.0–45.0)
PCO2 ARTERIAL: 39.9 mmHg (ref 35.0–45.0)
PH ARTERIAL: 7.35 (ref 7.35–7.45)
PH ARTERIAL: 7.38 (ref 7.35–7.45)
PH ARTERIAL: 7.39 (ref 7.35–7.45)
PO2 ARTERIAL: 109 mmHg (ref 80.0–110.0)
PO2 ARTERIAL: 61.9 mmHg — ABNORMAL LOW (ref 80.0–110.0)
PO2 ARTERIAL: 82.3 mmHg (ref 80.0–110.0)

## 2021-03-18 LAB — CBC W/ AUTO DIFF
BASOPHILS ABSOLUTE COUNT: 0.2 10*9/L — ABNORMAL HIGH (ref 0.0–0.1)
BASOPHILS RELATIVE PERCENT: 1.4 %
EOSINOPHILS ABSOLUTE COUNT: 0.2 10*9/L (ref 0.0–0.5)
EOSINOPHILS RELATIVE PERCENT: 1.9 %
HEMATOCRIT: 24.8 % — ABNORMAL LOW (ref 39.0–48.0)
HEMOGLOBIN: 7.9 g/dL — ABNORMAL LOW (ref 12.9–16.5)
LYMPHOCYTES ABSOLUTE COUNT: 0.8 10*9/L — ABNORMAL LOW (ref 1.1–3.6)
LYMPHOCYTES RELATIVE PERCENT: 6.8 %
MEAN CORPUSCULAR HEMOGLOBIN CONC: 31.8 g/dL — ABNORMAL LOW (ref 32.0–36.0)
MEAN CORPUSCULAR HEMOGLOBIN: 28.1 pg (ref 25.9–32.4)
MEAN CORPUSCULAR VOLUME: 88.2 fL (ref 77.6–95.7)
MEAN PLATELET VOLUME: 8.7 fL (ref 6.8–10.7)
MONOCYTES ABSOLUTE COUNT: 0.4 10*9/L (ref 0.3–0.8)
MONOCYTES RELATIVE PERCENT: 3.7 %
NEUTROPHILS ABSOLUTE COUNT: 10.3 10*9/L — ABNORMAL HIGH (ref 1.8–7.8)
NEUTROPHILS RELATIVE PERCENT: 86.2 %
PLATELET COUNT: 301 10*9/L (ref 150–450)
RED BLOOD CELL COUNT: 2.81 10*12/L — ABNORMAL LOW (ref 4.26–5.60)
RED CELL DISTRIBUTION WIDTH: 17.5 % — ABNORMAL HIGH (ref 12.2–15.2)
WBC ADJUSTED: 12 10*9/L — ABNORMAL HIGH (ref 3.6–11.2)

## 2021-03-18 LAB — MAGNESIUM: MAGNESIUM: 1.6 mg/dL (ref 1.6–2.6)

## 2021-03-18 LAB — BASIC METABOLIC PANEL
ANION GAP: 9 mmol/L (ref 5–14)
BLOOD UREA NITROGEN: 29 mg/dL — ABNORMAL HIGH (ref 9–23)
BUN / CREAT RATIO: 18
CALCIUM: 8.5 mg/dL — ABNORMAL LOW (ref 8.7–10.4)
CHLORIDE: 112 mmol/L — ABNORMAL HIGH (ref 98–107)
CO2: 23 mmol/L (ref 20.0–31.0)
CREATININE: 1.63 mg/dL — ABNORMAL HIGH
EGFR CKD-EPI (2021) MALE: 54 mL/min/{1.73_m2} — ABNORMAL LOW (ref >=60)
GLUCOSE RANDOM: 92 mg/dL (ref 70–179)
POTASSIUM: 3.7 mmol/L (ref 3.4–4.8)
SODIUM: 144 mmol/L (ref 135–145)

## 2021-03-18 LAB — HEPATIC FUNCTION PANEL
ALBUMIN: 2 g/dL — ABNORMAL LOW (ref 3.4–5.0)
ALKALINE PHOSPHATASE: 225 U/L — ABNORMAL HIGH (ref 46–116)
ALT (SGPT): 16 U/L (ref 10–49)
AST (SGOT): 40 U/L — ABNORMAL HIGH (ref <=34)
BILIRUBIN DIRECT: 0.4 mg/dL — ABNORMAL HIGH (ref 0.00–0.30)
BILIRUBIN TOTAL: 0.6 mg/dL (ref 0.3–1.2)
PROTEIN TOTAL: 5.9 g/dL (ref 5.7–8.2)

## 2021-03-18 LAB — PHOSPHORUS: PHOSPHORUS: 3.6 mg/dL (ref 2.4–5.1)

## 2021-03-18 MED ADMIN — ipratropium-albuteroL (DUO-NEB) 0.5-2.5 mg/3 mL nebulizer solution 3 mL: 3 mL | RESPIRATORY_TRACT | @ 21:00:00

## 2021-03-18 MED ADMIN — melatonin tablet 3 mg: 3 mg | ORAL | @ 23:00:00

## 2021-03-18 MED ADMIN — gabapentin (NEURONTIN) oral solution: 100 mg | GASTROENTERAL | @ 11:00:00 | Stop: 2021-03-18

## 2021-03-18 MED ADMIN — valACYclovir (VALTREX) tablet 500 mg: 500 mg | ORAL | @ 13:00:00

## 2021-03-18 MED ADMIN — senna (SENOKOT) tablet 1 tablet: 1 | ORAL | @ 14:00:00 | Stop: 2021-03-18

## 2021-03-18 MED ADMIN — oxyCODONE (OxyCONTIN) 12 hr crush resistant ER/CR tablet 10 mg: 10 mg | ORAL | @ 19:00:00 | Stop: 2021-03-28

## 2021-03-18 MED ADMIN — amikacin (AMIKIN) 500 mg in sodium chloride (NS) 0.9 % 4 mL inhalation: 500 mg | RESPIRATORY_TRACT | @ 15:00:00

## 2021-03-18 MED ADMIN — oxyCODONE (OxyCONTIN) 12 hr crush resistant ER/CR tablet 10 mg: 10 mg | ORAL | @ 16:00:00 | Stop: 2021-03-18

## 2021-03-18 MED ADMIN — HYDROmorphone (PF) injection Syrg 0.5 mg: .5 mg | INTRAVENOUS | @ 19:00:00 | Stop: 2021-03-18

## 2021-03-18 MED ADMIN — HYDROmorphone (DILAUDID) tablet 2 mg: 2 mg | ORAL | @ 23:00:00 | Stop: 2021-04-01

## 2021-03-18 MED ADMIN — oxyCODONE (ROXICODONE) immediate release tablet 15 mg: 15 mg | GASTROENTERAL | @ 11:00:00 | Stop: 2021-03-18

## 2021-03-18 MED ADMIN — HYDROmorphone (DILAUDID) tablet 2 mg: 2 mg | ORAL | @ 17:00:00 | Stop: 2021-04-01

## 2021-03-18 MED ADMIN — linezolid (ZYVOX) tablet 600 mg: 600 mg | ORAL | @ 16:00:00 | Stop: 2021-03-26

## 2021-03-18 MED ADMIN — polyethylene glycol (MIRALAX) packet 17 g: 17 g | ORAL | @ 14:00:00 | Stop: 2021-03-18

## 2021-03-18 MED ADMIN — potassium chloride CR tablet 40 mEq: 40 meq | ORAL | @ 13:00:00 | Stop: 2021-03-18

## 2021-03-18 MED ADMIN — gabapentin (NEURONTIN) oral solution: 100 mg | GASTROENTERAL | @ 03:00:00

## 2021-03-18 MED ADMIN — heparin (porcine) 5,000 unit/mL injection 5,000 Units: 5000 [IU] | SUBCUTANEOUS | @ 18:00:00

## 2021-03-18 MED ADMIN — heparin (porcine) 5,000 unit/mL injection 5,000 Units: 5000 [IU] | SUBCUTANEOUS | @ 10:00:00

## 2021-03-18 MED ADMIN — levoFLOXacin (LEVAQUIN) 750 mg/150 mL IVPB 750 mg: 750 mg | INTRAVENOUS | @ 02:00:00 | Stop: 2021-03-24

## 2021-03-18 MED ADMIN — gabapentin (NEURONTIN) capsule 200 mg: 200 mg | ORAL | @ 18:00:00

## 2021-03-18 MED ADMIN — ipratropium-albuteroL (DUO-NEB) 0.5-2.5 mg/3 mL nebulizer solution 3 mL: 3 mL | RESPIRATORY_TRACT | @ 08:00:00

## 2021-03-18 MED ADMIN — gabapentin (NEURONTIN) capsule 100 mg: 100 mg | ORAL | @ 14:00:00 | Stop: 2021-03-18

## 2021-03-18 MED ADMIN — ipratropium-albuteroL (DUO-NEB) 0.5-2.5 mg/3 mL nebulizer solution 3 mL: 3 mL | RESPIRATORY_TRACT | @ 15:00:00

## 2021-03-18 MED ADMIN — HYDROmorphone (PF) (DILAUDID) injection 2 mg: 2 mg | INTRAVENOUS | @ 02:00:00 | Stop: 2021-03-21

## 2021-03-18 MED ADMIN — HYDROmorphone (PF) (DILAUDID) injection 2 mg: 2 mg | INTRAVENOUS | @ 08:00:00 | Stop: 2021-03-18

## 2021-03-18 MED ADMIN — ondansetron (ZOFRAN) injection 8 mg: 8 mg | INTRAVENOUS | @ 03:00:00 | Stop: 2021-03-19

## 2021-03-18 MED ADMIN — levoFLOXacin (LEVAQUIN) tablet 750 mg: 750 mg | ORAL | @ 18:00:00 | Stop: 2021-03-24

## 2021-03-18 MED ADMIN — ipratropium-albuteroL (DUO-NEB) 0.5-2.5 mg/3 mL nebulizer solution 3 mL: 3 mL | RESPIRATORY_TRACT | @ 02:00:00

## 2021-03-18 MED ADMIN — heparin (porcine) 5,000 unit/mL injection 5,000 Units: 5000 [IU] | SUBCUTANEOUS | @ 03:00:00

## 2021-03-18 MED ADMIN — acetaminophen (TYLENOL) tablet 650 mg: 650 mg | ORAL | @ 18:00:00 | Stop: 2021-03-18

## 2021-03-18 MED ADMIN — linezolid in dextrose 5% (ZYVOX) 600 mg/300 mL IVPB 600 mg: 600 mg | INTRAVENOUS | @ 03:00:00 | Stop: 2021-03-19

## 2021-03-18 MED ADMIN — carboxymethylcellulose sodium (THERATEARS) 0.25 % ophthalmic solution 2 drop: 2 [drp] | OPHTHALMIC | @ 16:00:00 | Stop: 2021-03-18

## 2021-03-18 MED ADMIN — metoclopramide (REGLAN) injection 5 mg: 5 mg | INTRAVENOUS | @ 10:00:00 | Stop: 2021-03-18

## 2021-03-18 MED ADMIN — ondansetron (ZOFRAN) injection 8 mg: 8 mg | INTRAVENOUS | @ 11:00:00 | Stop: 2021-03-18

## 2021-03-18 MED ADMIN — metoclopramide (REGLAN) injection 5 mg: 5 mg | INTRAVENOUS | @ 05:00:00 | Stop: 2021-03-18

## 2021-03-18 MED ADMIN — OLANZapine zydis (ZyPREXA) disintegrating tablet 5 mg: 5 mg | SUBLINGUAL | @ 03:00:00

## 2021-03-18 MED ADMIN — clonazePAM (KlonoPIN) disintegrating tablet 0.5 mg: .5 mg | ORAL | @ 16:00:00

## 2021-03-18 MED ADMIN — linezolid in dextrose 5% (ZYVOX) 600 mg/300 mL IVPB 600 mg: 600 mg | INTRAVENOUS | @ 13:00:00 | Stop: 2021-03-18

## 2021-03-18 NOTE — Unmapped (Signed)
Patient started shift with a RASS of -2 but increased to a RASS of 0 by the end of shift. D/c'd dilaudid gtt. IV abx changed. Tube feeds held all day. Extubated around 1600 to HFNC, tolerating well. Bedside swallow eval performed, able to tolerate thin liquids and applesauce. PO meds crushed in applesauce and tolerated well. Foley with good urine output. Turned q2. OG tube removed with extubation. Corepak placed and removed with extubation. NSR/ST on tele. Afebrile all shift. No BM.     Problem: Adult Inpatient Plan of Care  Goal: Plan of Care Review  Outcome: Progressing  Goal: Patient-Specific Goal (Individualized)  Outcome: Progressing  Goal: Absence of Hospital-Acquired Illness or Injury  Outcome: Progressing  Intervention: Prevent Skin Injury  Recent Flowsheet Documentation  Taken 03/17/2021 1600 by Theora Gianotti, RN  Skin Protection: adhesive use limited  Taken 03/17/2021 1200 by Theora Gianotti, RN  Skin Protection:   adhesive use limited   incontinence pads utilized   skin sealant/moisture barrier applied  Taken 03/17/2021 0800 by Theora Gianotti, RN  Skin Protection:   adhesive use limited   incontinence pads utilized   mittens applied to hands   tubing/devices free from skin contact  Intervention: Prevent and Manage VTE (Venous Thromboembolism) Risk  Recent Flowsheet Documentation  Taken 03/17/2021 1600 by Theora Gianotti, RN  VTE Prevention/Management:   ambulation promoted   anticoagulant therapy   bleeding precautions maintained   bleeding risk factors identified   dorsiflexion/plantar flexion performed  Taken 03/17/2021 1200 by Theora Gianotti, RN  VTE Prevention/Management:   anticoagulant therapy   bleeding precautions maintained   dorsiflexion/plantar flexion performed  Taken 03/17/2021 0800 by Theora Gianotti, RN  Activity Management: bedrest  VTE Prevention/Management:   anticoagulant therapy   bleeding risk factors identified   bleeding precautions maintained  Intervention: Prevent Infection  Recent Flowsheet Documentation  Taken 03/17/2021 1600 by Theora Gianotti, RN  Infection Prevention:   cohorting utilized   environmental surveillance performed   equipment surfaces disinfected   hand hygiene promoted   personal protective equipment utilized   rest/sleep promoted   single patient room provided  Taken 03/17/2021 1200 by Theora Gianotti, RN  Infection Prevention:   cohorting utilized   environmental surveillance performed   equipment surfaces disinfected   hand hygiene promoted   personal protective equipment utilized   rest/sleep promoted   single patient room provided  Taken 03/17/2021 0800 by Theora Gianotti, RN  Infection Prevention:   cohorting utilized   environmental surveillance performed   equipment surfaces disinfected   hand hygiene promoted   personal protective equipment utilized   rest/sleep promoted   single patient room provided  Goal: Optimal Comfort and Wellbeing  Outcome: Progressing  Goal: Readiness for Transition of Care  Outcome: Progressing  Goal: Rounds/Family Conference  Outcome: Progressing     Problem: Infection  Goal: Absence of Infection Signs and Symptoms  Outcome: Progressing  Intervention: Prevent or Manage Infection  Recent Flowsheet Documentation  Taken 03/17/2021 1600 by Theora Gianotti, RN  Infection Management: aseptic technique maintained  Isolation Precautions: contact precautions maintained  Taken 03/17/2021 1200 by Theora Gianotti, RN  Infection Management: aseptic technique maintained  Isolation Precautions: contact precautions discontinued  Taken 03/17/2021 1000 by Theora Gianotti, RN  Isolation Precautions: enteric precautions discontinued  Taken 03/17/2021 0800 by Theora Gianotti, RN  Infection Management: aseptic technique maintained  Isolation Precautions:   contact precautions maintained   enteric precautions  initiated     Problem: Skin Injury Risk Increased  Goal: Skin Health and Integrity  Outcome: Progressing  Intervention: Optimize Skin Protection  Recent Flowsheet Documentation  Taken 03/17/2021 1600 by Theora Gianotti, RN  Pressure Reduction Techniques:   frequent weight shift encouraged   heels elevated off bed   positioned off wounds   pressure points protected  Pressure Reduction Devices:   heel offloading device utilized   positioning supports utilized   pressure-redistributing mattress utilized  Skin Protection: adhesive use limited  Taken 03/17/2021 1200 by Theora Gianotti, RN  Pressure Reduction Techniques:   frequent weight shift encouraged   heels elevated off bed   positioned off wounds   pressure points protected  Pressure Reduction Devices: heel offloading device utilized  Skin Protection:   adhesive use limited   incontinence pads utilized   skin sealant/moisture barrier applied  Taken 03/17/2021 0800 by Theora Gianotti, RN  Pressure Reduction Techniques:   frequent weight shift encouraged   heels elevated off bed   positioned off wounds   pressure points protected  Skin Protection:   adhesive use limited   incontinence pads utilized   mittens applied to hands   tubing/devices free from skin contact     Problem: Fluid Imbalance (Pneumonia)  Goal: Fluid Balance  Outcome: Progressing     Problem: Infection (Pneumonia)  Goal: Resolution of Infection Signs and Symptoms  Outcome: Progressing  Intervention: Prevent Infection Progression  Recent Flowsheet Documentation  Taken 03/17/2021 1600 by Theora Gianotti, RN  Infection Management: aseptic technique maintained  Isolation Precautions: contact precautions maintained  Taken 03/17/2021 1200 by Theora Gianotti, RN  Infection Management: aseptic technique maintained  Isolation Precautions: contact precautions discontinued  Taken 03/17/2021 1000 by Theora Gianotti, RN  Isolation Precautions: enteric precautions discontinued  Taken 03/17/2021 0800 by Theora Gianotti, RN  Infection Management: aseptic technique maintained  Isolation Precautions:   contact precautions maintained   enteric precautions initiated     Problem: Respiratory Compromise (Pneumonia)  Goal: Effective Oxygenation and Ventilation  Outcome: Progressing  Intervention: Optimize Oxygenation and Ventilation  Recent Flowsheet Documentation  Taken 03/17/2021 0800 by Theora Gianotti, RN  Airway/Ventilation Management:   airway patency maintained   calming measures promoted   humidification applied   pulmonary hygiene promoted     Problem: Pain Chronic (Persistent) (Comorbidity Management)  Goal: Acceptable Pain Control and Functional Ability  Outcome: Progressing  Intervention: Manage Persistent Pain  Recent Flowsheet Documentation  Taken 03/17/2021 0800 by Theora Gianotti, RN  Sleep/Rest Enhancement:   awakenings minimized   consistent schedule promoted   family presence promoted   noise level reduced     Problem: Self-Care Deficit  Goal: Improved Ability to Complete Activities of Daily Living  Outcome: Progressing     Problem: Communication Impairment (Mechanical Ventilation, Invasive)  Goal: Effective Communication  Outcome: Progressing     Problem: Device-Related Complication Risk (Mechanical Ventilation, Invasive)  Goal: Optimal Device Function  Outcome: Progressing  Intervention: Optimize Device Care and Function  Recent Flowsheet Documentation  Taken 03/17/2021 1600 by Theora Gianotti, RN  Aspiration Precautions:   awake/alert before oral intake   distractions minimized during oral intake   oral hygiene care promoted   respiratory status monitored   upright posture maintained  Taken 03/17/2021 1200 by Theora Gianotti, RN  Aspiration Precautions:   medication route adjusted   NPO pending swallow screening/evaluation   oral hygiene care promoted   respiratory status monitored  tube feeding placement verified   upright posture maintained  Taken 03/17/2021 0800 by Theora Gianotti, RN  Aspiration Precautions:   medication route adjusted   NPO pending swallow screening/evaluation   oral hygiene care promoted   respiratory status monitored   tube feeding placement verified   upright posture maintained  Airway Safety Measures: manual resuscitator/mask at bedside     Problem: Inability to Wean (Mechanical Ventilation, Invasive)  Goal: Mechanical Ventilation Liberation  Outcome: Progressing  Intervention: Promote Extubation and Mechanical Ventilation Liberation  Recent Flowsheet Documentation  Taken 03/17/2021 0800 by Theora Gianotti, RN  Sleep/Rest Enhancement:   awakenings minimized   consistent schedule promoted   family presence promoted   noise level reduced     Problem: Nutrition Impairment (Mechanical Ventilation, Invasive)  Goal: Optimal Nutrition Delivery  Outcome: Progressing     Problem: Skin and Tissue Injury (Mechanical Ventilation, Invasive)  Goal: Absence of Device-Related Skin and Tissue Injury  Outcome: Progressing  Intervention: Maintain Skin and Tissue Health  Recent Flowsheet Documentation  Taken 03/17/2021 1600 by Theora Gianotti, RN  Device Skin Pressure Protection:   absorbent pad utilized/changed   adhesive use limited   positioning supports utilized   pressure points protected  Taken 03/17/2021 1200 by Theora Gianotti, RN  Device Skin Pressure Protection:   absorbent pad utilized/changed   adhesive use limited   positioning supports utilized   pressure points protected   mittens applied to hands  Taken 03/17/2021 0800 by Theora Gianotti, RN  Device Skin Pressure Protection:   absorbent pad utilized/changed   adhesive use limited   mittens applied to hands   positioning supports utilized   pressure points protected     Problem: Ventilator-Induced Lung Injury (Mechanical Ventilation, Invasive)  Goal: Absence of Ventilator-Induced Lung Injury  Outcome: Progressing  Intervention: Prevent Ventilator-Associated Pneumonia  Recent Flowsheet Documentation  Taken 03/17/2021 0800 by Theora Gianotti, RN  Oral Care:   lip/mouth moisturizer applied   mouth swabbed   oral rinse provided   suction provided   teeth brushed     Problem: Impaired Wound Healing  Goal: Optimal Wound Healing  Outcome: Progressing  Intervention: Promote Wound Healing  Recent Flowsheet Documentation  Taken 03/17/2021 0800 by Theora Gianotti, RN  Activity Management: bedrest  Sleep/Rest Enhancement:   awakenings minimized   consistent schedule promoted   family presence promoted   noise level reduced     Problem: Fall Injury Risk  Goal: Absence of Fall and Fall-Related Injury  Outcome: Progressing

## 2021-03-18 NOTE — Unmapped (Signed)
Malignant Hematology Consult Note    Requesting Attending Physician :  [name], MD  Service Requesting Consult : Medical ICU (MDI)  Reason for Consult: B-ALL  Primary Oncologist: Dr Senaida Ores    Assessment: Adam Keith is a 40 y.o. 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 Malignant hematology was consulted for evaluation of whether to continue TKI for B-ALL.     #Ph(+) B-ALL  Oncology history:  S/p induction per GRAAPH-2005. Course complicated by septic shock, candida krusei fungemia, MRSA bacteremia with septic emboli c/b acute renal failure and respiratory distress requiring dialysis and intubation. ??Post-induction bmbx (day 29) demonstrated flow-based MRD-negative remission but persistent low-level BCR-ABL. ??Subsequently had difficulty tolerating single agent dasatinib (as a bridge to planned ponatinib-blinatumomab--had fluid retention and pleural effusion). ??He??then completed????4 cycles??of ponatinib-blinatumomab, initiated for treatment of MRD in the setting of poor tolerance of standard therapy.??He is now MRD-negative by flow with BCR-ABL level of 0.002% on marrow by PCR (01/11/21). Patient is now on ponatinib 30mg  monotherapy (since 01/2021).  Plan:  - Holding ponatinib until more stable. Anticipating resuming in the next few days.     #Acute hypoxemic respiratory failure 2/2 MRSA & Klebsiella oxytoca pneumonia - Influenza A - Hypogammaglobulinemia   Underwent bronch + BAL on 03/16/21 with the above findings. Intubated 03/07/21-03/17/21, currently on HFNC.   - On linezolid per ICID (12/3-) & Amikacin + Levofloxacin (12/14-).  S/p oseltamivir (12/3-12/7)   - s/p IVIG (12/7)   - Bactrim and valtrex prophylaxis    Recommendations:   - Appreciate excellent care of MICU and consultation from ICID.  - Continue to hold ponatinib. Anticipate resuming in the next few days.     This patient has been staffed with Dr. Barbette Merino. These recommendations were discussed with the primary team.     Please contact the malignant hematology fellow at [phone] with any further questions.    Napoleon Form, MD  Hematology-Oncology Fellow    -------------------------------------------------------------    HPI: Adam Keith is a 40 y.o. 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 Malignant hematology was consulted for evaluation of whether to continue TKI for B-ALL.     Seen in ID clinic 12/2 with fever/cough, positive for flu A, CXR concerning for pneumonia, sent home on linezolid. Re-presented to ED on 12/3 with worsening symptoms, including hypotension, hypoxia. WBC 20.6. Admitted to MICU. Overnight had improvement in lactate with IVFs (normalized, initially 4.1 on admission).  Febrile to 38.6 overnight.  Early this morning ~5am, had desaturation to 80% and required 100% FiO2 briefly, was able to be weaned to 75% FiO2 when our team saw him in MICU ~9:30 am.  6AM ABG 7.36/pO2 96/PCO2 35.    Conversant on HFNC this morning. Overall feels well.     Review of Systems: All positive and pertinent negatives are noted in the HPI; otherwise all other systems are negative    Oncologic History:  Oncology History Overview Note   Referring/Local Oncologist: None    Diagnosis:Ph+ ALL    Genetics:    Karyotype/FISH:Abnormal Karyotype: 46,XY,t(9;22)(q34;q11.2)[1]/45,XY,der(7;9)(q10;q10)t(9;22)(q34;q11.2),der(22)t(9;22)[11]/46,sdl,+der(22)t(9;22)[5]/46,XY[3]     Abnormal FISH: A BCR/ABL1 interphase FISH assay shows an abnormal signal pattern in 97% of the 100 cells scored. Of note,  2/97 abnormal cells have an additional BCR/ABL1 fusion signal from the der(22) chromosome, consistent with the additional copy of the der(22) seen in clone 3 by G-banding.  The findings support a diagnosis of leukemia and have implications for targeted therapy and for monitoring residual disease.      Molecular Genetics:  BCR-ABL1 p210 transcripts were detected at a level of 46.479 IS% ratio in bone marrow.  BCR-ABL1 p190 transcripts were detected at a level of 4 in 100,000 cells in bone marrow.    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)     01/24/2020 - 04/02/2020 Chemotherapy    IP/OP LEUKEMIA GRAAPH-2005 + RITUXIMAB < 60 YO  rituximab hypercvad (odd and even course)     02/24/2020 Remission    CR with PCR MRD+; marrow showing 70% blasts with <1% blasts, negative flow MRD, BCR-ABL p210 PCR 0.197%; CNS negative for blasts     03/09/2020 Progression    CSF - rare blast ID'ed - IT chemo given    03/13/20 - IT chemo, CSF negative     04/16/2020 Biopsy    BM Bx with 40-50% cellularity, <1% blasts, MRD flow cytometry negative, BCR-ABL p210 transcripts 0.036%     05/28/2020 - 05/28/2020 Chemotherapy    OP AML - CNS THERAPY (INTRATHECAL CYTARABINE, INTRATHECAL METHOTREXATE, OR INTRATHECAL TRIPLE)  Select one of the following: cytarabine IT 100 mg with hydrocortisone 50 mg, cytarabine IT 40 mg with methotrexate 15 mg with hydrocortisone 50 mg, OR methotrexate IT 12 mg with hydrocortisone 50 mg     06/19/2020 -  Chemotherapy    IP/OP LEUKEMIA BLINATUMOMAB 7-DAY INFUSION (MINIMAL RESIDUAL DISEASE; WT >= 22 KG) (HOME INFUSION)      Cycles 1*-4: Blinatumomab 28 mcg/day Days 1-28 of 6-week cycle.  *Given in the inpatient setting on Days 1-3 on Cycle 1 and Days 1-2 on Cycle 2, while other treatment days are given in the outpatient setting.    Cycle 1 MRD Dosing     07/18/2020 Adverse Reaction    Hospitalization: fevers. Pneumonia     07/23/2020 Biopsy    Diagnosis  Bone marrow, right iliac, aspiration and biopsy  -   Normocellular bone marrow (50%) with trilineage hematopoiesis and 1% blasts by manual aspirate differential  -   Flow cytometry MRD analysis reveals no definitive immunophenotypic evidence of residual B lymphoblastic leukemia   - BCR-ABL p210 0.006%         08/10/2020 -  Chemotherapy    Cycle 2 blinatumomab-ponatinib  Ponatinib 30mg     1 IT per cycle     09/21/2020 Adverse Reaction    Covid-19, symptomatic but not requiring hospitalization.     10/06/2020 -  Chemotherapy    Cycle 3 blinatumomab-ponatinib  Ponatinib 30mg        11/01/2020 Adverse Reaction    Hospitalization: MRSA Pneumonia requiring intubation    Blinatumomab stopped.  Ponatinib held until he stabilized.       12/14/2020 -  Chemotherapy    Cycle 4 blinatumomab 28 mcg/day days 1-28, ponatinib 30 mg per day IT triple therapy day 29     ALL (acute lymphoblastic leukemia) (CMS-HCC)   06/11/2020 -  Chemotherapy    IP/OP LEUKEMIA BLINATUMOMAB 7-DAY INFUSION (MINIMAL RESIDUAL DISEASE; WT >= 22 KG) (HOME INFUSION)  Cycles  1*-4: Blinatumomab 28 mcg/day Days 1-28 of 6-week cycle.  *Given in the inpatient setting on Days 1-3 on Cycle 1 and Days 1-2 on Cycle 2, while other treatment days are given in the outpatient setting.     09/21/2020 Initial Diagnosis    ALL (acute lymphoblastic leukemia) (CMS-HCC)         Past Medical History:   Diagnosis Date   ??? 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 BONE MARROW BIOPSY & ASPIRATION  01/21/2020        ??? CHG [place], CHEST,REAL TIME  11/20/2020    Procedure: ULTRASOUND, CHEST, REAL TIME WITH IMAGE DOCUMENTATION;  Surgeon: Jerelyn Charles, MD;  Location: [facility]ON[facility] PROCEDURE LAB [facility];  Service: Pulmonary   ??? IR INSERT PORT AGE GREATER THAN 5 YRS  03/10/2020    IR INSERT PORT AGE GREATER THAN 5 YRS 03/10/2020 [name], MD IMG VIR [facility]   ??? PR BRONCHOSCOPY,DIAGNOSTIC W LAVAGE Bilateral 07/20/2020    Procedure: BRONCHOSCOPY, RIGID OR FLEXIBLE, INCLUDE FLUOROSCOPIC GUIDANCE WHEN PERFORMED; W/BRONCHIAL ALVEOLAR LAVAGE WITH MODERATE SEDATION;  Surgeon: Dellis Filbert, MD;  Location[facility] PROCEDURE LAB [facility];  Service: Pulmonary       Family History   Problem Relation Age of Onset   ??? Melanoma Neg Hx    ??? Basal cell carcinoma Neg Hx    ??? Squamous cell carcinoma Neg Hx         Social History     Socioeconomic History   ??? Marital status: Married   Tobacco Use   ??? Smoking status: Former     Packs/day: 2.00     Types: Cigarettes     Quit date: 01/20/2020     Years since quitting: 1.1   ??? Smokeless tobacco: Former     Types: Financial planner   ??? Vaping Use: Never used   Other Topics Concern   ??? Do you use sunscreen? No   ??? Tanning bed use? No   ??? Are you easily burned? Yes   ??? Excessive sun exposure? No   ??? Blistering sunburns? Yes     Social Determinants of Health     Financial Resource Strain: Low Risk    ??? Difficulty of Paying Living Expenses: Not very hard   Food Insecurity: No Food Insecurity   ??? Worried About Running Out of Food in the Last Year: Never true   ??? Ran Out of Food in the Last Year: Never true   Transportation Needs: No Transportation Needs   ??? Lack of Transportation (Medical): No   ??? Lack of Transportation (Non-Medical): No       Social History     Social History Narrative   ??? Not on file       Allergies: is allergic to bupropion hcl, cefepime, ceftaroline fosamil, dapsone, onion, vancomycin analogues, bismuth subsalicylate, and furosemide.    Medications:   Meds:  ??? alteplase  1 mg Intravenous Once   ??? amikacin  500 mg Nebulization BID (RT)   ??? carboxymethylcellulose sodium  2 drop Both Eyes Q6H SCH   ??? clonazePAM  0.5 mg Oral BID   ??? gabapentin  200 mg Oral TID   ??? heparin (porcine) for subcutaneous use  5,000 Units Subcutaneous [facility]   ??? flu vacc qs2022-23 6mos up(PF)  0.5 mL Intramuscular During hospitalization   ??? ipratropium-albuteroL  3 mL Nebulization Q6H (RT)   ??? levoFLOXacin  750 mg Intravenous Q24H   ??? linezolid  600 mg Oral Q12H SCH   ??? melatonin  3 mg Oral QPM   ??? OLANZapine zydis  5 mg Sublingual Nightly   ??? ondansetron  8 mg Intravenous [facility]   ??? oxyCODONE  10 mg Oral Q8H SCH   ??? polyethylene glycol  17 g Oral BID   ??? senna  1 tablet Oral BID   ??? sulfamethoxazole-trimethoprim  1 tablet Oral 2 times per day on Sun Sat   ??? valACYclovir  500 mg Oral Daily     Continuous Infusions:    PRN Meds:.acetaminophen, albuterol, bisacodyL, ondansetron    Objective:   Vitals: Temp:  [36.7 ??C (98 ??F)-37.3 ??C (99.1 ??F)] 37.2 ??C (99 ??F)  Heart Rate:  [84-113] 86  SpO2 Pulse:  [83-112] 86  Resp:  [17-30] 19  A BP-2: (103-179)/(52-141) 169/68  MAP:  [72 mmHg-149 mmHg] 94 mmHg  FiO2 (%):  [40 %-80 %] 50 %  SpO2:  [90 %-100 %] 95 %    Physical Exam:  BP 124/65  - Pulse 86  - Temp 37.2 ??C (99 ??F) (Axillary)  - Resp 19  - Ht 185 cm (6' 0.84)  - Wt (!) 110.2 kg (242 lb 15.2 oz)  - SpO2 95%  - BMI 32.20 kg/m??    General appearance - Nontoxic appearing man sitting upright in ICU bed in no acute distress  Mental status - alert, awakens to voice.   Nose - HFNC in place.   Neck - supple   Pulmonary - Improving aeration.   Cardiovascular- Sinus tachycardia   Gastrointestinal - Obese abdomen, distended, nontender.  Neurological - alert, conversant  Musculoskeletal - no joint tenderness, deformity or swelling   Skin - no suspicious skin lesions noted       Test Results  Recent Labs     03/16/21  0331 03/17/21  0338 03/18/21  0337   WBC 17.5* 14.1* 12.0*   NEUTROABS 15.3* 12.3* 10.3*   HGB 9.2* 8.7* 7.9*   PLT [phone]       Imaging: Radiology studies were personally reviewed

## 2021-03-18 NOTE — Unmapped (Signed)
Pt oriented to self and place. Follows commands. PRN for pain. HR 90s-100s, NSR-ST. BP normotensive without use of vasopressors. Afebrile. Pulses palpable. Remains on HFNC, see respiratory for details. Intermittently tachypneic, but unlabored. Pt denies any symptoms of respiratory distress.  Diet order added. Tolerating thin liquids. Foley intact.  Maintains patent PIV access. Arterial line remains intact.  Wife visiting at bedside.  Safety precautions in place.    Problem: Adult Inpatient Plan of Care  Goal: Plan of Care Review  Outcome: Progressing  Goal: Patient-Specific Goal (Individualized)  Outcome: Progressing  Goal: Absence of Hospital-Acquired Illness or Injury  Outcome: Progressing  Intervention: Identify and Manage Fall Risk  Recent Flowsheet Documentation  Taken 03/17/2021 2000 by Silver Huguenin, RN  Safety Interventions:  ??? aspiration precautions  ??? lighting adjusted for tasks/safety  ??? low bed  ??? isolation precautions  ??? family at bedside  Intervention: Prevent Skin Injury  Recent Flowsheet Documentation  Taken 03/18/2021 0000 by Silver Huguenin, RN  Skin Protection:  ??? adhesive use limited  ??? tubing/devices free from skin contact  Taken 03/17/2021 2200 by Silver Huguenin, RN  Skin Protection:  ??? adhesive use limited  ??? tubing/devices free from skin contact  Taken 03/17/2021 2000 by Silver Huguenin, RN  Skin Protection:  ??? adhesive use limited  ??? tubing/devices free from skin contact  Intervention: Prevent and Manage VTE (Venous Thromboembolism) Risk  Recent Flowsheet Documentation  Taken 03/17/2021 2000 by Silver Huguenin, RN  Activity Management: bedrest  Intervention: Prevent Infection  Recent Flowsheet Documentation  Taken 03/17/2021 2000 by Silver Huguenin, RN  Infection Prevention:  ??? cohorting utilized  ??? rest/sleep promoted  ??? single patient room provided  Goal: Optimal Comfort and Wellbeing  Outcome: Progressing  Goal: Readiness for Transition of Care  Outcome: Progressing  Goal: Rounds/Family Conference  Outcome: Progressing     Problem: Infection  Goal: Absence of Infection Signs and Symptoms  Outcome: Progressing  Intervention: Prevent or Manage Infection  Recent Flowsheet Documentation  Taken 03/17/2021 2000 by Silver Huguenin, RN  Infection Management: aseptic technique maintained  Isolation Precautions: contact precautions maintained     Problem: Skin Injury Risk Increased  Goal: Skin Health and Integrity  Outcome: Progressing  Intervention: Optimize Skin Protection  Recent Flowsheet Documentation  Taken 03/18/2021 0000 by Silver Huguenin, RN  Pressure Reduction Techniques:  ??? frequent weight shift encouraged  ??? heels elevated off bed  Head of Bed (HOB) Positioning: HOB at 30-45 degrees  Pressure Reduction Devices: pressure-redistributing mattress utilized  Skin Protection:  ??? adhesive use limited  ??? tubing/devices free from skin contact  Taken 03/17/2021 2200 by Silver Huguenin, RN  Pressure Reduction Techniques:  ??? frequent weight shift encouraged  ??? heels elevated off bed  Head of Bed (HOB) Positioning: HOB at 30-45 degrees  Pressure Reduction Devices: pressure-redistributing mattress utilized  Skin Protection:  ??? adhesive use limited  ??? tubing/devices free from skin contact  Taken 03/17/2021 2000 by Silver Huguenin, RN  Pressure Reduction Techniques:  ??? frequent weight shift encouraged  ??? weight shift assistance provided  ??? heels elevated off bed  Head of Bed (HOB) Positioning: HOB at 30-45 degrees  Pressure Reduction Devices: pressure-redistributing mattress utilized  Skin Protection:  ??? adhesive use limited  ??? tubing/devices free from skin contact     Problem: Fluid Imbalance (Pneumonia)  Goal: Fluid Balance  Outcome: Progressing     Problem: Infection (Pneumonia)  Goal: Resolution of Infection Signs and  Symptoms  Outcome: Progressing  Intervention: Prevent Infection Progression  Recent Flowsheet Documentation  Taken 03/17/2021 2000 by Silver Huguenin, RN  Infection Management: aseptic technique maintained  Isolation Precautions: contact precautions maintained     Problem: Respiratory Compromise (Pneumonia)  Goal: Effective Oxygenation and Ventilation  Outcome: Progressing  Intervention: Optimize Oxygenation and Ventilation  Recent Flowsheet Documentation  Taken 03/18/2021 0000 by Silver Huguenin, RN  Head of Bed [facility]) Positioning: HOB at 30-45 degrees  Taken 03/17/2021 2200 by Silver Huguenin, RN  Head of Bed [facility]) Positioning: HOB at 30-45 degrees  Taken 03/17/2021 2000 by Silver Huguenin, RN  Head of Bed [facility]) Positioning: HOB at 30-45 degrees     Problem: Pain Chronic (Persistent) (Comorbidity Management)  Goal: Acceptable Pain Control and Functional Ability  Outcome: Progressing     Problem: Self-Care Deficit  Goal: Improved Ability to Complete Activities of Daily Living  Outcome: Progressing     Problem: Communication Impairment (Mechanical Ventilation, Invasive)  Goal: Effective Communication  Outcome: Progressing     Problem: Device-Related Complication Risk (Mechanical Ventilation, Invasive)  Goal: Optimal Device Function  Outcome: Progressing  Intervention: Optimize Device Care and Function  Recent Flowsheet Documentation  Taken 03/17/2021 2000 by Silver Huguenin, RN  Aspiration Precautions:  ??? awake/alert before oral intake  ??? respiratory status monitored  ??? upright posture maintained     Problem: Inability to Wean (Mechanical Ventilation, Invasive)  Goal: Mechanical Ventilation Liberation  Outcome: Progressing     Problem: Nutrition Impairment (Mechanical Ventilation, Invasive)  Goal: Optimal Nutrition Delivery  Outcome: Progressing     Problem: Skin and Tissue Injury (Mechanical Ventilation, Invasive)  Goal: Absence of Device-Related Skin and Tissue Injury  Outcome: Progressing  Intervention: Maintain Skin and Tissue Health  Recent Flowsheet Documentation  Taken 03/18/2021 0000 by Silver Huguenin, RN  Device Skin Pressure Protection: absorbent pad utilized/changed  Taken 03/17/2021 2200 by Silver Huguenin, RN  Device Skin Pressure Protection: absorbent pad utilized/changed  Taken 03/17/2021 2000 by Silver Huguenin, RN  Device Skin Pressure Protection: absorbent pad utilized/changed     Problem: Ventilator-Induced Lung Injury (Mechanical Ventilation, Invasive)  Goal: Absence of Ventilator-Induced Lung Injury  Outcome: Progressing  Intervention: Prevent Ventilator-Associated Pneumonia  Recent Flowsheet Documentation  Taken 03/18/2021 0000 by Silver Huguenin, RN  Head of Bed [facility]) Positioning: HOB at 30-45 degrees  Oral Care: oral rinse provided  Taken 03/17/2021 2200 by Silver Huguenin, RN  Head of Bed [facility]) Positioning: HOB at 30-45 degrees  Taken 03/17/2021 2000 by Silver Huguenin, RN  Head of Bed [facility]) Positioning: HOB at 30-45 degrees  Oral Care: mouth swabbed     Problem: Impaired Wound Healing  Goal: Optimal Wound Healing  Outcome: Progressing  Intervention: Promote Wound Healing  Recent Flowsheet Documentation  Taken 03/17/2021 2000 by Silver Huguenin, RN  Activity Management: bedrest     Problem: Fall Injury Risk  Goal: Absence of Fall and Fall-Related Injury  Outcome: Progressing  Intervention: Promote Injury-Free Environment  Recent Flowsheet Documentation  Taken 03/17/2021 2000 by Silver Huguenin, RN  Safety Interventions:  ??? aspiration precautions  ??? lighting adjusted for tasks/safety  ??? low bed  ??? isolation precautions  ??? family at bedside

## 2021-03-18 NOTE — Unmapped (Signed)
PHYSICAL THERAPY  Evaluation (03/18/21 1610)          Patient Name:?? Adam Keith????????   Medical Record Number: [id number]   Date of Birth: [birth year]  Sex: Male??  ??    Treatment Diagnosis: Impaired mobility, increased pain, decreased endurance, generalized weakness     Activity Tolerance: Tolerated treatment well     ASSESSMENT  Problem List: Decreased endurance, Decreased strength, Pain, Impaired sensation, Impaired hearing, Gait deviation, Shortness of breath, Decreased mobility (R ear deaf)      Assessment : Per EPIC:  Adam [name] with PMHx of ALL, COPD, prior MRSA bacteremia requiring intubation, MDD, lower back pain, tobacco use hx, presenting with nausea, vomiting, fever initially found to be hypotensive and tachycardic at oncologists office, started on linezolid. SOB worsened in subsequent 24hrs prompting presentation to the ED. Admitted to the MICU due to c/f acute hypoxic respiratory failure secondary to influenza infection with superimposed PNA.      On PT evaluation, pt demonstrated generalized weakness, decreased cardiovascular/muscular endurance,  able to transfer to sitting EOB but limited by pain and fatigue from earlier mobilization with OT.  Pt did not attempt standing this session but had no adverse response to upright positioning on EOB and bed level mobility.  Pt will benefit from skilled PT to address deficits above.  Recommend PT 5x weekly (high intensity) post acute discharge based on severity of deficits and decline from PLOF, however pt has potential to progress to 3x during admission.  After a review of the personal factors, comorbidities, clinical presentation, and examination of the number of affected body systems, the patient presents as a moderate complexity case.      Today's Interventions: Evaluation, PT education: role of PT and POC, activity pacing, continued mobility as tolerated with assist          PLAN  Planned Frequency of Treatment:?? 1-2x per day for: 3-4x week       Planned Interventions: Balance activities, Education - Patient, Education - Family / caregiver, Endurance activities, Functional mobility, Investment banker, operational, Home exercise program, Self-care / Home training, Stair training, Positioning, Therapeutic exercise, Therapeutic activity, Transfer training     Post-Discharge Physical Therapy Recommendations:?? 5x weekly, High intensity (progress)     PT DME Recommendations: Defer to post acute??????????       Goals:   Patient and Family Goals: None stated     Long Term Goal #1: TBD pending progress with mobility        SHORT GOAL #1: Pt will perform all bed mobility mod I  ?????????????????????? Time Frame : 2 weeks  SHORT GOAL #2: Pt will perform all functional transfers with LRAD mod I  ?????????????????????? Time Frame : 2 weeks  SHORT GOAL #3: Pt will participate with OOB mobility assessment  ??????????????????????       ??????????????????????       ??????????????????????       Prognosis:??       Barriers to Discharge: Inability to safely perform ADLS, Endurance deficits     SUBJECTIVE  Patient reports: Pt agreeable to therapy Didn't we just do this?  Current Functional Status: Pt received/left in semi fowlers with all needs met/within reach, lines intact, RN aware  Services patient receives:  (Pt reports he had completed inpatient rehab on [facility])  Prior Functional Status: Pt independent with ADL/mobility without AD, spouse present most days per week who can assist as needed  Equipment available at home: None  Past Medical History:   Diagnosis Date   ??? 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 Smoking status: Former     Packs/day: 2.00     Types: Cigarettes     Quit date: 01/20/2020     Years since quitting: 1.1   ??? Smokeless tobacco: Former     Types: Chew   Substance Use Topics   ??? Alcohol use: Not on file       Past Surgical History:   Procedure Laterality Date   ??? BONE MARROW BIOPSY & ASPIRATION  01/21/2020        ??? CHG [place], CHEST,REAL TIME 11/20/2020    Procedure: ULTRASOUND, CHEST, REAL TIME WITH IMAGE DOCUMENTATION;  Surgeon: Jerelyn Charles, MD;  Location: BRONCH PROCEDURE LAB [facility];  Service: Pulmonary   ??? IR INSERT PORT AGE GREATER THAN 5 YRS  03/10/2020    IR INSERT PORT AGE GREATER THAN 5 YRS 03/10/2020 [name], MD IMG VIR [facility]   ??? PR BRONCHOSCOPY,DIAGNOSTIC W LAVAGE Bilateral 07/20/2020    Procedure: BRONCHOSCOPY, RIGID OR FLEXIBLE, INCLUDE FLUOROSCOPIC GUIDANCE WHEN PERFORMED; W/BRONCHIAL ALVEOLAR LAVAGE WITH MODERATE SEDATION;  Surgeon: Dellis Filbert, MD;  Location: BRONCH PROCEDURE LAB [facility];  Service: Pulmonary             Family History   Problem Relation Age of Onset   ??? Melanoma Neg Hx    ??? Basal cell carcinoma Neg Hx    ??? Squamous cell carcinoma Neg Hx         Allergies: Bupropion hcl, Cefepime, Ceftaroline fosamil, Dapsone, Onion, Vancomycin analogues, Bismuth subsalicylate, and Furosemide                  Objective Findings  Precautions / Restrictions  Precautions: Falls precautions, Isolation precautions (contact)  Weight Bearing Status: Non-applicable  Required Braces or Orthoses: Non-applicable     Communication Preference: Verbal          Pain Comments: Pt endorsed 8/10 pain at rest, close to scheduled pain dose, RN aware  Medical Tests / Procedures: EMR reviewed  Equipment / Environment: Arterial line, Vascular access (PIV, TLC, Port-a-cath, PICC), Supplemental oxygen, Telemetry, Foley (60% FiO2, 60L HFNC)     At Rest: HR 88, SpO2 97%  With Activity: SpO2 >92% throughout without titration  Orthostatics: dizziness in sitting that did not resolve with time, BP stable  Airway Clearance: OOB mobility as tolerated     Living Situation  Living Environment: House  Lives With: Spouse  Home Living: One level home, Stairs to enter with rails, Walk-in shower, Grab bars in shower, Standard height toilet  Rail placement (outside): Bilateral rails  Number of Stairs to Enter (outside): 5      Cognition: WFL  Visual/Perception: Within Functional Limits     Skin Inspection: Intact where visualized     Upper Extremities  UE ROM: Right WFL, Left WFL  UE Strength: Right WFL, Left WFL    Lower Extremities  LE ROM: Right WFL, Left WFL  LE Strength: Right WFL, Left WFL     Sensation: Impaired, Numbness, Tingling  Sensation comment: diminished LT in B hands/feet, faintly intact B feet, tender to palpation  Balance comment: SBA sitting EOB, standing not attempted  Posture: WFL      Bed Mobility: Supine to Sit  Bed Mobility: supine <> sitting EOB  SBA     Transfer comments: NT                         Endurance: fair     Physical Therapy Session Duration  PT Individual [mins]: 26     Medical Staff Made Aware: RN Sophie     I attest that I have reviewed the above information.  Signed: Mahala Menghini, PT  Filed 03/18/2021

## 2021-03-18 NOTE — Unmapped (Signed)
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 *          Cross cover summary     - Discontinued ertapenem; started inhaled amikacin 500 mg BID and levofloxacin 750 daily per ID recommendations  - Advance oral diet as tolerated      Eppie Gibson, MD

## 2021-03-18 NOTE — Unmapped (Addendum)
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 Pt extubated 12/15 and is now on HFNC. Pt presents with the above stated problem list. Pt was pleasant and motivated to participate in occupational therapy evaluation. Pt was overall min-A for bed mobility. functional transfers and side steps w/ RW. Pt is 1 person assist for ADLs. Pt presents with good overall safety awareness and is able to communicate his needs well. Pt will benefit from skilled OT services during acute stay to maximize safety and progress function. Post-discharge, pt will benefit from skilled OT services at 5x high frequency, however pt may progress to 3x with progress during acute OT. After review of the patient's occupational profile and history, assessment of occupational performance, clinical decision making, and development of POC, the patient presents as a moderate complexity case.    Today's Interventions: ADL retraining, Education - Patient, Bed mobility, Balance activities, Functional mobility, Safety education, Compensatory tech. training, Conservation   Pt educated on role of OT, OT POC, energy conservation strategies (breathing strategies), LBD strategies and benefits of routine participation in out of bed activity/self-care routines. Pt assisted with and completed: bed mobility, sitting balance, donned socks seated EOB, sit<>stand from elevated bed, side steps along bed and session ended with pt supine in bed. See ADL/mobility section for specific assist levels.      Activity Tolerance During Today's Session  Tolerated treatment well    Plan  Planned Frequency of Treatment:  1-2x per day for: 3-4x week  Planned Treatment Duration: 04/01/21    Planned Interventions:  ADL retraining, Balance activities, Bed mobility, Compensatory tech. training, Conservation, Education - Patient, Education - Family / caregiver, Endurance activities, Environmental support, Functional mobility, Functional cognition, Field seismologist education, Home exercise program, Actor Occupational Therapy Recommendations:   5x weekly, High intensity   OT DME Recommendations: Defer to post acute -      GOALS:   Patient and Family Goals: To get stronger    IP Long Term Goal #1: Pt will score 24/24 on AMPAC in 6 weeks       Short Term:  Pt will complete toilet transfer with SBA + LRAD   Time Frame : 2 weeks  Pt will complete 3 minutes of standing ADLs with SBA + LRAD   Time Frame : 2 weeks  Pt will complete full LBD with SBA + LRAD   Time Frame : 2 weeks     Prognosis:  Good  Positive Indicators:  Motivated  Barriers to Discharge: Inability to safely perform ADLS, Endurance deficits    Subjective  Current Status Pt received/left sitting EOB, all lines in tact, call bell within reach, father at bedside, all immediate needs met. RN aware/updated.  Prior Functional Status Independent with ADL and functional  mobility without an assistive device. Drives. Lives with wife who can provide assist as needed.       Services patient receives:  (Pt reports he had completed inpatient rehab on 8BT)    Patient / Caregiver reports: I feel weak...but I feel okay.    Past Medical History:   Diagnosis Date   ??? 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 Smoking status: Former     Packs/day: 2.00     Types: Cigarettes     Quit date: 01/20/2020     Years since quitting: 1.1   ??? Smokeless tobacco: Former     Types: Chew   Substance Use Topics   ??? Alcohol use: Not on file      Past Surgical History:   Procedure Laterality Date   ??? BONE MARROW BIOPSY & ASPIRATION  01/21/2020        ??? CHG [place], CHEST,REAL TIME  11/20/2020    Procedure: ULTRASOUND, CHEST, REAL TIME WITH IMAGE DOCUMENTATION;  Surgeon: Jerelyn Charles, MD;  Location: [facility]ON[facility] PROCEDURE LAB [facility];  Service: Pulmonary   ??? IR INSERT PORT AGE GREATER THAN 5 YRS  03/10/2020    IR INSERT PORT AGE GREATER THAN 5 YRS 03/10/2020 [name], MD IMG VIR [facility]   ??? PR BRONCHOSCOPY,DIAGNOSTIC W LAVAGE Bilateral 07/20/2020    Procedure: BRONCHOSCOPY, RIGID OR FLEXIBLE, INCLUDE FLUOROSCOPIC GUIDANCE WHEN PERFORMED; W/BRONCHIAL ALVEOLAR LAVAGE WITH MODERATE SEDATION;  Surgeon: Dellis Filbert, MD;  Location: [facility]ON[facility] PROCEDURE LAB [facility];  Service: Pulmonary    Family History   Problem Relation Age of Onset   ??? Melanoma Neg Hx    ??? Basal cell carcinoma Neg Hx    ??? Squamous cell carcinoma Neg Hx         Bupropion hcl, Cefepime, Ceftaroline fosamil, Dapsone, Onion, Vancomycin analogues, Bismuth subsalicylate, and Furosemide     Objective Findings  Precautions / Restrictions  Falls precautions, Isolation precautions (contact)    Weight Bearing  Non-applicable    Required Braces or Orthoses  Non-applicable    Communication Preference  Verbal    Pain  Pt reports pain/discomfort in Foley insertion site. RN aware/udpated.    Equipment / Environment  Arterial line, Vascular access (PIV, TLC, Port-a-cath, PICC), Supplemental oxygen, Telemetry, Foley (60% FiO2, 60L HFNC)    Living Situation  Living Environment: House  Lives With: Spouse  Home Living: One level home, Stairs to enter with rails, Walk-in shower, Grab bars in shower, Standard height toilet (Confirm whether or not pt still has shower chair with back)  Rail placement (outside): Bilateral rails  Number of Stairs to Enter (outside): 5  Equipment available at home: None     Cognition   Orientation Level:  Oriented x 4   Arousal/Alertness:  Appropriate responses to stimuli   Attention Span:  Appears intact   Memory:  Appears intact   Following Commands:  Follows all commands and directions without difficulty   Safety Judgment:  Good awareness of safety precautions   Awareness of Errors:  Good awareness of errors made    Vision / Hearing   Vision: No acute deficits identified     Hearing: Severe impairment   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     Hand Function:  Right Hand Function: Right hand grip strength, ROM and coordination WNL  Left Hand Function: Left hand grip strength, ROM and coordination WNL  Hand Dominance: Right    Skin Inspection:  Skin Inspection: Intact where visualized    ROM / Strength:  UE ROM/Strength: Left WFL, Right WFL  LE ROM/Strength: Left Impaired/Limited, Right Impaired/Limited  RLE Impairment: Reduced strength  LLE Impairment: Reduced strength  LE ROM/ Strength Comment: Pt heavily bracing through UEs on RW when in standing 2-2 BLE weakness    Coordination:  Coordination: WFL    Sensation:  RUE Sensation: RUE impaired (Tingling in fingertips - neuropathy)  RUE Sensation Impairment: Tingling  LUE Sensation: LUE impaired (Tingling in fingertips - neuropathy)  LUE Sensation Impairment: Tingling  RLE Sensation: RLE impaired (Tingling in tips of toes - neuropathy)  RLE Sensation Impairment: Tingling  LLE Sensation: LLE impaired (Tingling in tips of toes - neuropathy)  LLE Sensation Impairment: Tingling    Balance:  Standing balance: CGA with RW    Functional Mobility  Transfer Assistance Needed: Yes (Min-A + RW from elevated bed; required increased time to get to fully upright stand)  Transfers - Needs Assistance: Min assist  Bed Mobility Assistance Needed: Yes  Bed Mobility - Needs Assistance: Contact Guard assist (CGA for supine to sit; Min-A for sitting EOB to supine (required assist for lifting legs back onto bed)  Ambulation: Min-A + RW for steps in place, side steps along bed    ADLs  ADLs: Needs assistance with ADLs  ADLs - Needs Assistance: Feeding, Grooming, Bathing, Toileting, UB dressing, LB dressing  Feeding - Needs Assistance: Set Up Assist  Grooming - Needs Assistance: Set Up Assist  Bathing - Needs Assistance: Performed seated, Min assist  Toileting - Needs Assistance: Min assist  UB Dressing - Needs Assistance: Performed seated, Set Up Assist  LB Dressing - Needs Assistance: Performed seated, Min assist (Pt donned socks seated EOB with figure-4 technique - SBA for R foot, Min-A for L (required support getting over feet to prevent toenail snagging, pt able to pull up sock once on foot). Anticipate Min-A for full lower body dressing)  IADLs: Pt reports prior to admission he was independently driving, running errands.    AM-PAC-Daily Activity  Lower Body Dressing assistance needs: A Little - Minimal/Contact Guard Assist/Supervision  Bathing assistance needs: A lot - Maximum/Moderate Assistance  Toileting assistance needs: A Little - Minimal/Contact Guard Assist/Supervision  Upper Body Dressing assistance needs: A Little - Minimal/Contact Guard Assist/Supervision  Personal Grooming assistance needs: A Little - Minimal/Contact Guard Assist/Supervision  Eating Meals assistance needs: A Little - Minimal/Contact Guard Assist/Supervision    Daily Activity Score:  Daily Activity Score: 17    Score (in points): % of Functional Impairment, Limitation, Restriction  6: 100% impaired, limited, restricted  7-8: At least 80%, but less than 100% impaired, limited restricted  9-13: At least 60%, but less than 80% impaired, limited restricted  14-19: At least 40%, but less than 60% impaired, limited restricted  20-22: At least 20%, but less than 40% impaired, limited restricted  23: At least 1%, but less than 20% impaired, limited restricted  24: 0% impaired, limited restricted    Vitals / Orthostatics  At Rest: Pt satting at 87-88% on 60% 60L HFNC, titrated up to 65% FiO2 prior to mobility, satting at 93%  With Activity: Pt satting at 88-90% while seated EOB on 65% FiO2, bumped up to 70% FiO2, satting at 94%; weaned back to 65% FiO2 at end of session with O2 sat 92-94%  Vitals/Orthostatics: Asymptomatic      Medical Staff Made Aware: RN Joretta Bachelor  Occupational Therapy Session Duration  OT Individual [mins]: 41         I attest that I have reviewed the above information.  Signed: Rosiland Oz, OT  Ceasar Mons 03/18/2021

## 2021-03-18 NOTE — Unmapped (Signed)
IMMUNOCOMPROMISED HOST INFECTIOUS DISEASE PROGRESS NOTE      Adam Keith is being seen in consultation at the request of [name], MD for evaluation of pneumonia.    Assessment/Recommendations:    Adam 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 Patient received linezolid and oseltamavir (s/p 5d, through 12/7), as well as initial aztreonam. We are concerned that his persistent fevers may be secondary to a MRSA pulmonary abscess/empyema, and recommend when possible for CT chest to evaluate this further. Given his ongoing fevers while on appropriate therapy would also be reasonable to CT abdomen and pelvis to assess for any other potential sites of infection (although fevers could certainly be attributed to his severe pneumonia.) Patient tolerated IVIG 12/9, continues to be febrile.     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 krusei fungemia  - Prior chemotherapy: [id number]-2005 induction with dasatinib (vincristine, dexamethasone and dasatinib); C1D1 01/24/2020; difficulty tolerating single-agent dasatinib  - Current chemotherapy: blinatumomab (anti-CD19/CD3) + ponatinib (TKI) s/p C4 on 12/14/20; 01/2021 now on ponatinib monotherapy maintenance  - 01/11/21: Normocellular bone marrow (30% overall) with trilineage hematopoiesis and less than 1% blasts by manual aspirate differential; Flow cytometry MRD analysis reveals no definitive immunophenotypic evidence of residual B lymphoblastic leukemia; BCR-ABL p210 transcripts were detected at a level of 0.002 IS % ratio in bone marrow.    # Hypogammaglobulimia  - 11/01/20 IgG 266 declined IVIG  - 03/11/21 IgG 148 - IVIG infusion stopped as patient developed rigors, tachycardia and HTN during infusion despite pre-medication   - 03/12/21 IgG 213 - completed IVIG with premedications and slower rate of infusion  -03/17/21 IgG 632    # AKI  Estimated Creatinine Clearance: 66.8 mL/min (A) (based on SCr of 1.89 mg/dL (H)).  - b/l Cr ~1.6-1.8  - 12/9 Nephrology considering CRRT.     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  - 12/6 tracheal aspirate: MRSA  - 12/8 unable to tolerate IVIG  - 12/9 IVIg  - 12/11 Lower resp Cx - staph aureus 2+ , klebsiella oxytoca ( sens : Levofloxacin, amikacin, tobramycin)  - 12/11 CT Chest : Multifocal consolidative airspace disease, nonspecific small pleural effusion in the dependent aspect of the left pleural space and trace loculated collections within the lateral aspect of the right chest.  -12/11 CT abdomen: Mild  circumferential wall thickening with adjacent soft tissue stranding involving the distal sigmoid colon/rectum. Findings are nonspecific although can be seen in the setting of early colitis/proctitis  Tx : Linezolid /Oseltamivir---> Linezolid/aztreonam/flagyl/azithromycin/oseltamivir 12/3 --> linezolid/aztreonam/osteltamivir 12/3 -->linezolid [name] 12/5--->linezolid 12/6 -->    #Left upper chest, pressure ulcer from proning 03/11/21, improving    Prior infections:  #MRSA RLL PNA c/b bacteremia 11/01/2020, probable right empyema 8/12/202; presumed relapsed RML MRSA pneumonia 11/18/2020  - 8/19 Failed thoracocentesis by IP  - 9/26 CT chest parenchyma without e/o infection, persistent but decreased loculated effusions on right  - s/p MRSA antibiotics until 01/04/2021  #Persistent right chest pain 12/29/20; Low grade fever, weakness 01/28/21  - 02/02/2021 - Dr. 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  #Possible COVID-18 associated fungal infection 07/17/20 s/p 07/23/20 isavuconazole until 12/03/20     Antimicrobial Intolerance/allergy  Cefepime - DRESS  Ceftaroline - DRESS  Dapsone - possible agranulocytosis, per chart anaphylaxis  Vancomycin - probable ototoxicity (in combination with furosemide)  Isavuconazole - elevated LFTs in the setting of TKI     RECOMMENDATIONS    Diagnostic  F/u 12/10 BCx and repeat BCx for new fever spikes      Monitoring for antimicrobial toxicities  Monitor CBC with diff and CMP at least twice weekly  Monitor QTc interval while on levofloxacin with ECG twice weekly.    Treatment  # Influenza A with superimposed  bacterial pneumonia 03/05/21 : MRSA/Klebsiella oxytoca  STOP iv aztreonam 1g q8h as klebsiella oxytoca is resistant to it and change to below regimen as per known susceptibilities.  START inhaled amikacin liposome inhalation - pharmacy assistance appreciated for dosing  START iv/enteral levofloxacin 750 mg q24h  CONTINUE linezolid 600 mg bid for treatment of MRSA pneumonia      Prophylaxis  Cont valacyclovir, bactrim  Recommend COVID BV and Flu shot after discharge          The [facility] ID service will continue to follow.  Please page the ID Transplant/Liquid Oncology Fellow consult at [phone] with questions.  Patient was discussed with Dr Raylene Miyamoto.    Kandice Moos, MD  Fellow, Division of Infectious Diseases    Interval HPI:      Source of information includes:  Electronic Medical Records.  History obtained from: EMR  , primary team  Interval events since last encounter: Steady fevers in 38s, this on 2g Tylenol/24h and cooling blanket. Borderline tachycardic. Remains intubated, FiO2 40-50% (improvement), PEEP 12. Not hypotensive.  HPI: Patient unable to provide.   ROS: intubated, sedated, unable to obtain    Medications:   Antimicrobials:  Linezolid 12/3 ->  Daptomycin 12/3 X 1  Oseltamivir 12/2 ->12/7  Aztreonam 12/3 -> 12/6 12/13-->  Azithromycin 12/3 -12/4   Metronidazole 12/3 x 1    Ppx: TMP/SMX, valacyclovir    Current/Prior immunomodulators:  ponatinib 30mg  monotherapy (held)    Other medications reviewed.        Vital Signs last 24 hours:  Temp:  [36.7 ??C (98.1 ??F)-39.1 ??C (102.4 ??F)] 36.7 ??C (98.1 ??F)  Heart Rate:  [98-121] 106  SpO2 Pulse:  [98-121] 106  Resp:  [16-29] 24  A BP-2: (108-179)/(58-94) 145/70  MAP:  [76 mmHg-117 mmHg] 92 mmHg  FiO2 (%):  [30 %-80 %] 60 %  SpO2:  [86 %-100 %] 97 %    Physical Exam:   Patient Lines/Drains/Airways Status       Active Active Lines, Drains, & Airways       Name Placement date Placement time Site Days    ETT  7.5 03/07/21  2131  -- 9    Urethral Catheter 03/07/21  2200  --  9    Peripheral IV 03/08/21 Right Antecubital 03/08/21  0102  Antecubital  9    Peripheral IV 03/17/21 Anterior;Left Forearm 03/17/21  1312  Forearm  less than 1    Arterial Line 03/07/21 Right Radial 03/07/21  1500  Radial  10                    Const [x]  vital signs above    []  NAD, non-toxic appearance []  Chronically ill-appearing, non-distressed  Intubated, sedated but opening eyes and reacting some to exam, on vent      Eyes [x]  Lids normal bilaterally, conjunctiva anicteric and noninjected OU     [] PERRL  [] EOMI        ENMT [x]  Normal appearance of external nose and ears, no nasal discharge        [x]  MMM, no lesions on lips or gums []  No thrush, leukoplakia, oral lesions  []  Dentition good []  Edentulous []  Dental caries present  []  Hearing normal  []  TMs with good light reflexes bilaterally   Intubated      Neck [x]  Neck of normal appearance and trachea midline        []  No thyromegaly, nodules, or tenderness   []  Full neck ROM        Lymph []  No LAD in neck     []  No LAD in supraclavicular area     []  No LAD in axillae   []  No LAD in epitrochlear chains     []  No LAD in inguinal areas        CV []  RRR            [x]  No peripheral edema     [x]  Pedal pulses intact   [x]  No abnormal heart sounds appreciated   [x]  Extremities WWP   Regular, borderline tachy      Resp []  Normal WOB at rest    []  No breathlessness with speaking, no coughing  []  CTA anteriorly    []  CTA posteriorly    Ventilated; coarse breath sounds bilaterally      GI [x]  Normal inspection, NTND   [x]  NABS     [x]  No umbilical hernia on exam       []  No hepatosplenomegaly     []  Inspection of perineal and perianal areas normal        GU [x]  Normal external genitalia     [] No urinary catheter present in urethra   []  No CVA tenderness    []  No tenderness over renal allograft  Foley present      MSK [x]  No clubbing or cyanosis of hands       []  No vertebral point tenderness  [x]  No focal tenderness or abnormalities on palpation of joints in RUE, LUE, RLE, or LLE        Skin [x]  No rashes, lesions, or ulcers of visualized skin     [  x] Skin warm and dry to palpation   Bullae on back not visualized; L anterior chest pressure ulcer covered      Neuro [x]  Face expression symmetric  []  Sensation to light touch grossly intact throughout    []  Moves extremities equally    [x]  No tremor noted        []  CNs II-XII grossly intact     []  DTRs normal and symmetric throughout []  Gait unremarkable  Intubated, on Precedex, unable to assess      Psych []  Appropriate affect       []  Fluent speech         []  Attentive, good eye contact  []  Oriented to person, place, time          []  Judgment and insight are appropriate   Intubated, on Precedex, unable to assess        Data for Medical Decision Making     Recent Labs   Lab Units 03/17/21  1407 03/17/21  0338 03/16/21  1518   WBC 10*9/L  --  14.1*  --    HEMOGLOBIN g/dL  --  8.7*  --    PLATELET COUNT (1) 10*9/L  --  289  --    NEUTRO ABS 10*9/L  --  12.3*  --    LYMPHO ABS 10*9/L  --  1.0*  --    EOSINO ABS 10*9/L  --  0.1  --    SODIUM mmol/L 150* 145 146*   POTASSIUM mmol/L 4.2 4.1 4.0   BUN mg/dL 38* 41* 41*   CREATININE mg/dL 1.61* 0.96* 0.45*   GLUCOSE mg/dL 409 97 99   CALCIUM mg/dL 8.6* 8.9 8.7   MAGNESIUM mg/dL 1.7 1.8 1.9   PHOSPHORUS mg/dL  --  3.7  --    BILIRUBIN TOTAL mg/dL  --  0.8  --    AST U/L  --  27  --    ALT U/L  --  16  --    IGG mg/dL  --   --  811*     I reviewed and noted the following labs: downtrending leukocytosis 14.1 . Anemia with Hgb 8.7 Cr elevated 1.89, decreasing. AST/ALT 27/16, IGG 632.    Microbiology:  Past cultures were reviewed in Epic and CareEverywhere.  Microbiology Results (last day)       Procedure Component Value Date/Time Date/Time    Blood Culture #1 [[id number]]  (Normal) Collected: 03/09/21 0458    Lab Status: Final result Specimen: Blood from 1 Peripheral Draw Updated: 03/14/21 0545     Blood Culture, Routine No Growth at 5 days    Blood Culture #2 [[id number]]  (Normal) Collected: 03/09/21 0458    Lab Status: Final result Specimen: Blood from 1 Peripheral Draw Updated: 03/14/21 0545     Blood Culture, Routine No Growth at 5 days    Lower Respiratory Culture [[id number]] Collected: 03/14/21 0254    Lab Status: Preliminary result Specimen: Sputum from Lung, Combined Updated: 03/14/21 0323     Gram Stain <10  Epithelial cells/LPF      >25 PMNS/LPF      1+ Gram positive cocci      Yeast present      Acceptable for culture    Narrative:      Specimen Source: Lung, Combined    Blood Culture, Adult [[id number]] Collected: 03/13/21 2339    Lab Status: In process Specimen: Blood from 1 Peripheral Draw Updated: 03/13/21 2344    Blood Culture, Adult [  [id number]] Collected: 03/13/21 2339    Lab Status: In process Specimen: Blood from 1 Peripheral Draw Updated: 03/13/21 2344          Imaging:  XR Chest Portable    Result Date: 03/17/2021  EXAM: XR CHEST PORTABLE DATE: 03/17/2021 10:47 AM ACCESSION: [id number] UN DICTATED: 03/17/2021 11:09 AM INTERPRETATION LOCATION: [facility] CLINICAL INDICATION: 40 years old Male with HYPOXEMIA  TECHNIQUE: Portable Chest Radiograph. COMPARISON: 03/10/2021 and priors. FINDINGS: MEDICAL DEVICES: Interval right IJ central venous catheter has been removed. Endotracheal tube and NG/OG tube unchanged. LUNGS AND PLEURA: Similar right midlung airspace consolidation. Left lung is clear. No pleural effusion. No pneumothorax. HEART AND MEDIASTINUM: Heart is normal in size. Normal thoracic aorta. BONES AND SOFT TISSUES: No acute  osseous abnormalities.     *Stable right midlung airspace consolidation consistent with pneumonia since prior, improving since December 5.    XR Abdomen 1 View    Result Date: 03/17/2021  EXAM: XR ABDOMEN 1 VIEW DATE: 03/17/2021 1:20 PM ACCESSION: [id number] UN DICTATED: 03/17/2021 1:39 PM INTERPRETATION LOCATION: [facility] CLINICAL INDICATION: 40 years old Male with NGT (CATHETER VASCULAR FIT & ADJ)  TECHNIQUE: Supine view of the abdomen was obtained.  COMPARISON: 03/15/2021 CT abdomen and pelvis. FINDINGS: Nonweighted esophagogastric tube with tip and side-port overlying the left upper quadrant. Weighted esophagogastric tube with tip overlying the left upper quadrant. Gas is seen in nondilated large and small bowel. There is no evidence of obstruction. No abnormal soft tissue masses or calcifications are noted. No acute osseous abnormality is identified. Visualized lung bases are better evaluated on earlier same day chest radiograph.     -- Weighted and nonweighted esophagogastric tubes with tips overlying the gastric body.     XR Chest 12/7  Similar patchy and confluent consolidation in the right lung, greatest in the mid zone. Persistent left lower lung consolidation.   Small right pleural effusion. No pneumothorax.  Stable cardiomediastinal silhouette.      XR Chest 12/5 : Interval increase in right hemithorax consolidation. Persistent retrocardiac consolidation.No pleural effusion or pneumothorax    XR Chest 12/4: Slight increase in nonspecific left basilar opacities. Patchy and confluent heterogeneous opacities in the right upper lung are similar.      XR Chest Portable 03/06/2021  Marked interval worsening of airspace consolidation in the right upper and midlung zones. The left lung is clear.  No pleural effusion or pneumothorax.  Stable cardiomediastinal silhouette.    I have reviewed the images from 03/10/21 and agree with the findings.    Additional Studies:   (12/10) EKG QTc 427

## 2021-03-18 NOTE — Unmapped (Signed)
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 *    Interval history: Adam [name] with PMHx of ALL, COPD, prior MRSA bacteremia requiring intubation, MDD, lower back pain, tobacco use hx, presenting with??nausea, vomiting, fever initially found to be hypotensive and tachycardic at oncologists office, started on linezolid.??SOB worsened??in subsequent 24hrs??prompting presentation to the ED.??Admitted to the MICU due to c/f??acute hypoxic respiratory failure secondary to influenza infection with superimposed PNA.??    24 hr inter: Patient extubated yesterday without complication. Currently 45% HiFLow, Last ABG 7.38, 32.6, 61.9 on 60% HiFlow. Patient's pain currently biggest concern is pain. Please see analgesia section for current regimen.     Neurological   Analgesia  S/p extubation. Will decrease pain regimen to home regimen in anticipation of discharge soon.   - Continue PRN oral dilaudid 2q 6hrs  - Continue schedule oxycodone to 10 TID  - Increase gabapentin 200 q8hrs     Chronic??Pain??- Peripheral neuropathy  Has chronic pain at baseline from??ALL. Follows with [facility].   - See above    MDD  Holding home nortriptyline   -Restart home clonazepam 0.5 mg BID  ??  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 Stable.     Pulmonary   AHRF 2/2 Influenza w/ superimposed MRSA, Klebsiella PNA - Extubated 12/14  S/p intubation 12/5. P/F ratio less than 150 currently . c/f ARDS given acute onset, bilateral infiltrates. Patient stagnated during intubation course. Broadened Abx, bronchoscopy on 12/13, proned after for 24 hours. Completed SBT after proning and extubated after. Patient course after extubation has been uncomplicated. Last ABGG 7.38, 32,6, 61.9 on 60% HiFlow. Patient has since been weaned to 45% HiFlow.  - Continue Abx as indicated below   - Droplet Precautions??  - Follow serial ABGs to ensure appropriately compensating for acidosis (q6h)  - Consider CT chest w/o contrast if acute worsening of respiratory status, but otherwise imaging would not change current management.  ??  COPD  20 ppy tobacco use hx, followed by Pulmonology outpt. Per 01/25/21 no prior exacerbations. Utilizes home albuterol.   - O2 Goal 88-92%  - cont ANORO Ellipta  - Holding on steroids  - ABx as listed below  - prn duonebs  ??  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 Underwent unsuccessful thoracentesis, at the time referred to CT surgery, at the time didn't think warranted surgical intervention. POCUS attempted yesterday, did not show any dense consolidations. Obtain Echo to assess for vegetations. CT scan showed multifocal consolidative airspace, also showed nonspecific small pleural effusion in left pleural space.  -TTE negative for vegetations.       Cardiovascular   NAI  Renal   AKI  Likely prerenal vs ATN. Has not resolved with increasing amounts of fluids so ATN more likely. Stable UOP. Creatinine downtrending  - Consult nephrology, appreciate recommendations   -CTM    Infectious Disease/Autoimmune   Influenza c/b MRSA, Klebsiella PNA - Hx of MRSA Bacteremia   MRSA screen positive, [facility] growing MRSA. though BCx remain negative. CT chest obtained yesterday showed worsening multifocal consolidative airspace, with potential empyema given trace loculated collections with lateral aspect of the right chest. Given these finding patients will undergo bronscopy with BAL (with micro labs order below). Cell count showed 97% neutrophils. L[facility] from 12/11 grew klebsiella with sensitivities to ertapenem, meropenem, levofloxacin.   - Infectious disease Immunocomp consulted, follow reccs.  - Continue Linezolid  (12/3-???)  -Start inhaled amikacin and oral levofloxacin 750 mg q24h  -Stop Ertapenem 1g daily for 7 days  -Stop aztreonam 1g q8h after bronchoscopy   -Per ID If patient fevers obtain blood cultures  -F/u AFB, aspergillus, RPP, pneumocystis, CMV, lymphocytes, cell count, actinomyces, fungal , bronchial culture  - follow up BCx: NGTD at 48 days  Follow-up [facility]: Positive for staph aureus  - consider CT Chest to further eval lung pathology if O2 status does not improve    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  - continue home Valtrex 500mg  daily  - continue home Bactrium (normally BID Sat-Sun)  - holding TKI per heme reccs see below  ??  History of DRESS syndrome??2/2 cephalosporin  - consulted ICID for reccs re: abx regimen  - Avoid beta lactams    Cultures:  Blood Culture, Routine (no units)   Date Value   03/13/2021 No Growth at 4 days   03/13/2021 No Growth at 4 days     Urine Culture, Comprehensive (no units)   Date Value   03/06/2021 NO GROWTH     Lower Respiratory Culture (no units)   Date Value   03/14/2021 3+ Methicillin resistant Staphylococcus aureus (A)   03/14/2021 1+ Oropharyngeal Flora Isolated     WBC (10*9/L)   Date Value   03/18/2021 12.0 (H)     WBC, UA (/HPF)   Date Value   03/14/2021 4 (H)          FEN/GI   Constipation-Possible residuals from tube feeds  Restarting tube feeds, will restart bowel regimen given now BM in last 48 hours  -Increase lactulose BID  -Cotninue Reglan 5 mg IV TID  -continue senna 1 tablets BID      Malnutrition Assessment: Not done yet.  Body mass index is 32.2 kg/m??.  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 ??He subsequently had difficulty tolerating single agent dasatinib. S/p 4 cycles of ponatinib-blinatumomab.??He is now MRD-negative and on ponatinib monotherapy  - heme on board: Recc holding home TKI until malignant hematology evals for ready to resume. Patient's family will need update to bring in home supply then.    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 IgG 632 in recheck      DVT PPx  - subcutaneous heparin 5000 units q 8hr    Endocrine   NAI    Integumentary   Skin Rash over anterior abdomen   Pictures in media tab. Suspect due to injury during proning. Stable  - Continue to monitor  -  Wound care consult    - WOCN consulted for high risk skin assessment Yes.  - cont pressure mitigating precautions per skin policy    Prophylaxis/LDA/Restraints/Consults   Can CVC be removed? N/A, no CVC present (including vascular catheter for HD or PLEX)   Can A-line be removed? No: frequent ABGs  Can Foley be removed? No: Need continuous I/O  Mobility plan: Step 1 - Range of motion    Feeding: Tube feeds at goal  Analgesia: Pain adequately controlled, weaning  Sedation SAT/SBT: N/A  Thromboembolic ppx: SQ heparin  Head of bed >30 degrees: Yes  Ulcer ppx: Yes, mechanical ventilation > 3 days, not on tube feeds  Glucose within target range: Yes, in range    Does patient need/have an active type/screen? No    RASS at goal? Yes  [place] Agitation Assessment Scale (RASS) : 0 (03/18/2021 12:00 PM)     Can antipsychotics be stopped? N/A, not on antipsychotics  CAM-ICU Result: Negative (11/09/2020  9:00 PM)      Would hospice care be appropriate for this patient? No, patient improving or expected to improve  Any unaddressed hospice/palliative care needs? no    Patient Lines/Drains/Airways Status     Active Active Lines, Drains, & Airways     Name Placement date Placement time Site Days    ETT  7.5 03/07/21  2131  -- 10    Urethral Catheter 03/07/21  2200  --  10    Peripheral IV 03/08/21 Right Antecubital 03/08/21  0102  Antecubital  10    Peripheral IV 03/17/21 Anterior;Left Forearm 03/17/21  1312  Forearm  1    Arterial Line 03/07/21 Right Radial 03/07/21  1500  Radial  10              Patient Lines/Drains/Airways Status     Active Wounds     Name Placement date Placement time Site Days    Wound 03/09/21 Pressure Injury Nose Mid Stage 2 03/09/21  1005  Nose  9    Wound 03/09/21 Skin Tear Chest Left 03/09/21  1030  Chest  9    Wound 03/16/21 Skin Tear Back Lower;Right 03/16/21  0849  Back  2                Goals of Care     Code Status: Full Code    Designated Healthcare Decision Maker:  Mr. Hackenberg current decisional capacity for healthcare decision-making is Full capacity. His designated Educational psychologist) is/are   HCDM (patient stated preference): Adam, Keith - Spouse - [phone].      Subjective     See 24 hr int    Objective     Vitals - past 24 hours  Temp:  [36.7 ??C (98 ??F)-37.3 ??C (99.1 ??F)] 37.3 ??C (99.1 ??F)  Heart Rate:  [84-113] 98  SpO2 Pulse:  [83-112] 95  Resp:  [17-34] 22  FiO2 (%):  [40 %-80 %] 45 %  SpO2:  [90 %-100 %] 98 % Intake/Output  I/O last 3 completed shifts:  In: 2061.9 [P.O.:420; I.V.:181.9; NG/GT:230; IV Piggyback:1230]  Out: 3285 [Urine:3285]     Physical Exam:    General:??Appears comfortable, sedated in NAD   HEENT:??NCAT, reports no sinus tenderness. Moist mucus membranes.  CV:??sinus tachycardia, no m/r/g. No clubbing  Pulm:??Ventilator breath sounds, 40% FiO2  GI:??Abdomen distended, soft, non-tender. Bowel sounds present.  MSK:??No rigidity. Diffuse myalgia. No LE swelling/size discrepancy.  Skin:??Unchanged Abrasion to left anterior chest wall, no crepitus, linear erythema on  anterior abdomen  Neuro:??Sedated, not able to follow commands or track.      Continuous Infusions:       Scheduled Medications:   ??? alteplase  1 mg Intravenous Once   ??? amikacin  500 mg Nebulization BID (RT)   ??? clonazePAM  0.5 mg Oral BID   ??? gabapentin  200 mg Oral TID   ??? heparin (porcine) for subcutaneous use  5,000 Units Subcutaneous [facility]   ??? HYDROmorphone  0.5 mg Intravenous Once   ??? flu vacc qs2022-23 6mos up(PF)  0.5 mL Intramuscular During hospitalization   ??? ipratropium-albuteroL  3 mL Nebulization Q6H (RT)   ??? levoFLOXacin  750 mg Oral Daily   ??? linezolid  600 mg Oral Q12H SCH   ??? melatonin  3 mg Oral QPM   ??? OLANZapine zydis  5 mg Sublingual Nightly   ??? oxyCODONE  10 mg Oral Q8H SCH   ??? polyethylene glycol  17 g Oral BID   ??? senna  1 tablet Oral BID   ??? sulfamethoxazole-trimethoprim  1 tablet Oral 2 times per day on Sun Sat   ??? valACYclovir  500 mg Oral Daily       PRN medications:  acetaminophen, albuterol, bisacodyL, carboxymethylcellulose sodium, HYDROmorphone, ondansetron    Data/Imaging Review: Reviewed in Epic and personally interpreted on 03/18/2021. See EMR for detailed results.

## 2021-03-19 LAB — HEPATIC FUNCTION PANEL
ALBUMIN: 2.1 g/dL — ABNORMAL LOW (ref 3.4–5.0)
ALKALINE PHOSPHATASE: 232 U/L — ABNORMAL HIGH (ref 46–116)
ALT (SGPT): 22 U/L (ref 10–49)
AST (SGOT): 49 U/L — ABNORMAL HIGH (ref <=34)
BILIRUBIN DIRECT: 0.3 mg/dL (ref 0.00–0.30)
BILIRUBIN TOTAL: 0.5 mg/dL (ref 0.3–1.2)
PROTEIN TOTAL: 5.6 g/dL — ABNORMAL LOW (ref 5.7–8.2)

## 2021-03-19 LAB — CBC W/ AUTO DIFF
BASOPHILS ABSOLUTE COUNT: 0.1 10*9/L (ref 0.0–0.1)
BASOPHILS RELATIVE PERCENT: 0.8 %
EOSINOPHILS ABSOLUTE COUNT: 0.2 10*9/L (ref 0.0–0.5)
EOSINOPHILS RELATIVE PERCENT: 1.4 %
HEMATOCRIT: 27 % — ABNORMAL LOW (ref 39.0–48.0)
HEMOGLOBIN: 8.3 g/dL — ABNORMAL LOW (ref 12.9–16.5)
LYMPHOCYTES ABSOLUTE COUNT: 1 10*9/L — ABNORMAL LOW (ref 1.1–3.6)
LYMPHOCYTES RELATIVE PERCENT: 7.4 %
MEAN CORPUSCULAR HEMOGLOBIN CONC: 31 g/dL — ABNORMAL LOW (ref 32.0–36.0)
MEAN CORPUSCULAR HEMOGLOBIN: 27.2 pg (ref 25.9–32.4)
MEAN CORPUSCULAR VOLUME: 87.7 fL (ref 77.6–95.7)
MEAN PLATELET VOLUME: 8.7 fL (ref 6.8–10.7)
MONOCYTES ABSOLUTE COUNT: 0.4 10*9/L (ref 0.3–0.8)
MONOCYTES RELATIVE PERCENT: 2.7 %
NEUTROPHILS ABSOLUTE COUNT: 11.6 10*9/L — ABNORMAL HIGH (ref 1.8–7.8)
NEUTROPHILS RELATIVE PERCENT: 87.7 %
PLATELET COUNT: 308 10*9/L (ref 150–450)
RED BLOOD CELL COUNT: 3.07 10*12/L — ABNORMAL LOW (ref 4.26–5.60)
RED CELL DISTRIBUTION WIDTH: 16.9 % — ABNORMAL HIGH (ref 12.2–15.2)
WBC ADJUSTED: 13.3 10*9/L — ABNORMAL HIGH (ref 3.6–11.2)

## 2021-03-19 LAB — BASIC METABOLIC PANEL
ANION GAP: 10 mmol/L (ref 5–14)
BLOOD UREA NITROGEN: 15 mg/dL (ref 9–23)
BUN / CREAT RATIO: 10
CALCIUM: 8.3 mg/dL — ABNORMAL LOW (ref 8.7–10.4)
CHLORIDE: 110 mmol/L — ABNORMAL HIGH (ref 98–107)
CO2: 19 mmol/L — ABNORMAL LOW (ref 20.0–31.0)
CREATININE: 1.44 mg/dL — ABNORMAL HIGH
EGFR CKD-EPI (2021) MALE: 63 mL/min/{1.73_m2} (ref >=60)
GLUCOSE RANDOM: 100 mg/dL (ref 70–179)
POTASSIUM: 4.1 mmol/L (ref 3.4–4.8)
SODIUM: 139 mmol/L (ref 135–145)

## 2021-03-19 LAB — PHOSPHORUS: PHOSPHORUS: 2.7 mg/dL (ref 2.4–5.1)

## 2021-03-19 LAB — SLIDE REVIEW

## 2021-03-19 LAB — MAGNESIUM: MAGNESIUM: 1.4 mg/dL — ABNORMAL LOW (ref 1.6–2.6)

## 2021-03-19 MED ADMIN — levoFLOXacin (LEVAQUIN) tablet 750 mg: 750 mg | ORAL | @ 14:00:00 | Stop: 2021-03-19

## 2021-03-19 MED ADMIN — linezolid (ZYVOX) tablet 600 mg: 600 mg | ORAL | @ 01:00:00 | Stop: 2021-03-26

## 2021-03-19 MED ADMIN — heparin (porcine) 5,000 unit/mL injection 5,000 Units: 5000 [IU] | SUBCUTANEOUS | @ 18:00:00

## 2021-03-19 MED ADMIN — linezolid (ZYVOX) tablet 600 mg: 600 mg | ORAL | @ 14:00:00 | Stop: 2021-03-26

## 2021-03-19 MED ADMIN — ondansetron (ZOFRAN) injection 8 mg: 8 mg | INTRAVENOUS | @ 10:00:00

## 2021-03-19 MED ADMIN — OLANZapine zydis (ZyPREXA) disintegrating tablet 5 mg: 5 mg | SUBLINGUAL | @ 01:00:00

## 2021-03-19 MED ADMIN — heparin (porcine) 5,000 unit/mL injection 5,000 Units: 5000 [IU] | SUBCUTANEOUS | @ 01:00:00

## 2021-03-19 MED ADMIN — melatonin tablet 3 mg: 3 mg | ORAL | @ 23:00:00

## 2021-03-19 MED ADMIN — MORPhine 4 mg/mL injection 4 mg: 4 mg | INTRAVENOUS | @ 02:00:00 | Stop: 2021-03-18

## 2021-03-19 MED ADMIN — ipratropium-albuteroL (DUO-NEB) 0.5-2.5 mg/3 mL nebulizer solution 3 mL: 3 mL | RESPIRATORY_TRACT | @ 21:00:00

## 2021-03-19 MED ADMIN — valACYclovir (VALTREX) tablet 500 mg: 500 mg | ORAL | @ 14:00:00

## 2021-03-19 MED ADMIN — polyethylene glycol (MIRALAX) packet 17 g: 17 g | ORAL | @ 01:00:00

## 2021-03-19 MED ADMIN — ondansetron (ZOFRAN) injection 8 mg: 8 mg | INTRAVENOUS | @ 18:00:00

## 2021-03-19 MED ADMIN — gabapentin (NEURONTIN) capsule 200 mg: 200 mg | ORAL | @ 18:00:00

## 2021-03-19 MED ADMIN — acetaminophen (TYLENOL) tablet 1,000 mg: 1000 mg | ORAL | @ 18:00:00

## 2021-03-19 MED ADMIN — oxyCODONE (OxyCONTIN) 12 hr crush resistant ER/CR tablet 10 mg: 10 mg | ORAL | @ 01:00:00 | Stop: 2021-03-28

## 2021-03-19 MED ADMIN — ipratropium-albuteroL (DUO-NEB) 0.5-2.5 mg/3 mL nebulizer solution 3 mL: 3 mL | RESPIRATORY_TRACT | @ 14:00:00

## 2021-03-19 MED ADMIN — carvediloL (COREG) tablet 12.5 mg: 12.5 mg | ORAL | @ 18:00:00

## 2021-03-19 MED ADMIN — HYDROmorphone (DILAUDID) tablet 2 mg: 2 mg | ORAL | @ 14:00:00 | Stop: 2021-04-01

## 2021-03-19 MED ADMIN — heparin (porcine) 5,000 unit/mL injection 5,000 Units: 5000 [IU] | SUBCUTANEOUS | @ 10:00:00

## 2021-03-19 MED ADMIN — senna (SENOKOT) tablet 2 tablet: 2 | ORAL | @ 01:00:00

## 2021-03-19 MED ADMIN — oxyCODONE (OxyCONTIN) 12 hr crush resistant ER/CR tablet 10 mg: 10 mg | ORAL | @ 11:00:00 | Stop: 2021-03-28

## 2021-03-19 MED ADMIN — ipratropium-albuteroL (DUO-NEB) 0.5-2.5 mg/3 mL nebulizer solution 3 mL: 3 mL | RESPIRATORY_TRACT | @ 04:00:00

## 2021-03-19 MED ADMIN — acetaminophen (TYLENOL) tablet 1,000 mg: 1000 mg | ORAL | @ 02:00:00

## 2021-03-19 MED ADMIN — gabapentin (NEURONTIN) capsule 200 mg: 200 mg | ORAL | @ 01:00:00

## 2021-03-19 MED ADMIN — amikacin (AMIKIN) 500 mg in sodium chloride (NS) 0.9 % 4 mL inhalation: 500 mg | RESPIRATORY_TRACT | @ 14:00:00 | Stop: 2021-03-24

## 2021-03-19 MED ADMIN — clonazePAM (KlonoPIN) disintegrating tablet 0.5 mg: .5 mg | ORAL | @ 01:00:00

## 2021-03-19 MED ADMIN — acetaminophen (TYLENOL) tablet 1,000 mg: 1000 mg | ORAL | @ 11:00:00

## 2021-03-19 MED ADMIN — clonazePAM (KlonoPIN) disintegrating tablet 0.5 mg: .5 mg | ORAL | @ 14:00:00

## 2021-03-19 MED ADMIN — gabapentin (NEURONTIN) capsule 200 mg: 200 mg | ORAL | @ 14:00:00

## 2021-03-19 MED ADMIN — amikacin (AMIKIN) 500 mg in sodium chloride (NS) 0.9 % 4 mL inhalation: 500 mg | RESPIRATORY_TRACT | @ 02:00:00

## 2021-03-19 MED ADMIN — oxyCODONE (OxyCONTIN) 12 hr crush resistant ER/CR tablet 10 mg: 10 mg | ORAL | @ 18:00:00 | Stop: 2021-03-28

## 2021-03-19 NOTE — Unmapped (Signed)
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 *          Cross cover summary     - No acute overnight events  - Morphine x1 for pain    Eppie Gibson, MD

## 2021-03-19 NOTE — Unmapped (Signed)
IMMUNOCOMPROMISED HOST INFECTIOUS DISEASE PROGRESS NOTE      Adam Keith is being seen in consultation at the request of Adam Labrador, MD for evaluation of pneumonia.    Assessment/Recommendations:    Adam 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 Patient received linezolid and oseltamavir (s/p 5d, through 12/7), as well as initial aztreonam. We are concerned that his persistent fevers may be secondary to a MRSA pulmonary abscess/empyema, and recommend when possible for CT chest to evaluate this further. Given his ongoing fevers while on appropriate therapy would also be reasonable to CT abdomen and pelvis to assess for any other potential sites of infection (although fevers could certainly be attributed to his severe pneumonia.) Patient tolerated IVIG 12/9, continues to be febrile.     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 krusei fungemia  - Prior chemotherapy: GRAAPPH-2005 induction with dasatinib (vincristine, dexamethasone and dasatinib); C1D1 01/24/2020; difficulty tolerating single-agent dasatinib  - Current chemotherapy: blinatumomab (anti-CD19/CD3) + ponatinib (TKI) s/p C4 on 12/14/20; 01/2021 now on ponatinib monotherapy maintenance  - 01/11/21: Normocellular bone marrow (30% overall) with trilineage hematopoiesis and less than 1% blasts by manual aspirate differential; Flow cytometry MRD analysis reveals no definitive immunophenotypic evidence of residual B lymphoblastic leukemia; BCR-ABL p210 transcripts were detected at a level of 0.002 IS % ratio in bone marrow.    # Hypogammaglobulimia  - 11/01/20 IgG 266 declined IVIG  - 03/11/21 IgG 148 - IVIG infusion stopped as patient developed rigors, tachycardia and HTN during infusion despite pre-medication   - 03/12/21 IgG 213 - completed IVIG with premedications and slower rate of infusion  -03/17/21 IgG 632    # AKI  Estimated Creatinine Clearance: 77.4 mL/min (A) (based on SCr of 1.63 mg/dL (H)).  - b/l Cr ~1.6-1.8  - 12/9 Nephrology considering CRRT.     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  - 12/6 tracheal aspirate: MRSA  - 12/8 unable to tolerate IVIG  - 12/9 IVIg  - 12/11 Lower resp Cx - staph aureus 2+ , klebsiella oxytoca ( sens : Levofloxacin, amikacin, tobramycin)  - 12/11 CT Chest : Multifocal consolidative airspace disease, nonspecific small pleural effusion in the dependent aspect of the left pleural space and trace loculated collections within the lateral  aspect of the right chest.  -12/11 CT abdomen: Mild circumferential wall thickening with adjacent soft tissue stranding involving the distal sigmoid colon/rectum. Findings are nonspecific although can be seen in the setting of early colitis/proctitis  Tx : Linezolid /Oseltamivir---> Linezolid/aztreonam/flagyl/azithromycin/oseltamivir 12/3 --> linezolid/aztreonam/osteltamivir 12/3 -->linezolid [name] 12/5--->linezolid 12/6 -->linezolid/aztreonam 11/13--->linezolid/levofloxacin/inhaled amikacin 12/14    #Left upper chest, pressure ulcer from proning 03/11/21, improving    Prior infections:  #MRSA RLL PNA c/b bacteremia 11/01/2020, probable right empyema 8/12/202; presumed relapsed RML MRSA pneumonia 11/18/2020  - 8/19 Failed thoracocentesis by IP  - 9/26 CT chest parenchyma without e/o infection, persistent but decreased loculated effusions on right  - s/p MRSA antibiotics until 01/04/2021  #Persistent right chest pain 12/29/20; Low grade fever, weakness 01/28/21  - 02/02/2021 - Dr. 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  #Possible COVID-18 associated fungal infection 07/17/20 s/p 07/23/20 isavuconazole until 12/03/20     Antimicrobial Intolerance/allergy  Cefepime - DRESS  Ceftaroline - DRESS  Dapsone - possible agranulocytosis, per chart anaphylaxis  Vancomycin - probable ototoxicity (in combination with furosemide)  Isavuconazole - elevated LFTs in the setting of TKI     RECOMMENDATIONS    Diagnostic  F/u 12/10 BCx and repeat BCx for new fever spikes    Monitoring for antimicrobial toxicities  Monitor CBC with diff and CMP at least twice weekly  Monitor QTc interval while on levofloxacin with ECG twice weekly.    Treatment  # Influenza A with superimposed  MRSA bacterial pneumonia  CONTINUE linezolid 600 mg bid for treatment of MRSA pneumonia  Anticipate a minimum duration of 4-6 weeks , final duration depends on clinical improvement and resolution of loculated effusion on CT.    #Klebsiella oxytoca - ventilator associated pneumonia  Continue inhaled amikacin liposome inhalation - pharmacy assistance appreciated for dosing  Continue levofloxacin 750 mg q24h  Aim for a duration of 5-7 days     Prophylaxis  Cont valacyclovir, bactrim  Recommend COVID BV and Flu shot after discharge          The [facility] ID service will continue to follow.  Please page the ID Transplant/Liquid Oncology Fellow consult at [phone] with questions.  Patient was discussed with Dr Raylene Miyamoto.    Kandice Moos, MD  Fellow, Division of Infectious Diseases    Interval HPI:      Source of information includes:  Electronic Medical Records.  History obtained from: EMR  , primary team  Interval events since last encounter: extubated , interactive ,Not hypotensive.  HPI: Feels well , intermittent cough   ROS: negative    Medications:   Antimicrobials:  Linezolid 12/3 ->  Daptomycin 12/3 X 1  Oseltamivir 12/2 ->12/7  Aztreonam 12/3 -> 12/6 12/13-->12/14  Inhaled amikacin 12/15-->  Levofloxacin 12/15-->  Azithromycin 12/3 -12/4   Metronidazole 12/3 x 1    Ppx: TMP/SMX, valacyclovir    Current/Prior immunomodulators:  ponatinib 30mg  monotherapy (held)    Other medications reviewed.        Vital Signs last 24 hours:  Temp:  [36.7 ??C (98 ??F)-37.3 ??C (99.1 ??F)]  37.2 ??C (99 ??F)  Heart Rate:  [74-101] 82  SpO2 Pulse:  [76-101] 82  Resp:  [16-34] 19  A BP-2: (82-171)/(51-141) 154/88  MAP:  [72 mmHg-263 mmHg] 104 mmHg  FiO2 (%):  [45 %-65 %] 45 %  SpO2:  [81 %-100 %] 93 %    Physical Exam:   Patient Lines/Drains/Airways Status       Active Active Lines, Drains, & Airways       Name Placement date Placement time Site Days    ETT  7.5 03/07/21  2131  -- 10    Peripheral IV 03/08/21 Right Antecubital 03/08/21  0102  Antecubital  10    Peripheral IV 03/17/21 Anterior;Left Forearm 03/17/21  1312  Forearm  1    Arterial Line 03/07/21 Right Radial 03/07/21  1500  Radial  11                    Const [x]  vital signs above    []  NAD, non-toxic appearance []  Chronically ill-appearing, non-distressed        Eyes [x]  Lids normal bilaterally, conjunctiva anicteric and noninjected OU     [] PERRL  [] EOMI        ENMT [x]  Normal appearance of external nose and ears, no nasal discharge        [x]  MMM, no lesions on lips or gums []  No thrush, leukoplakia, oral lesions  []  Dentition good []  Edentulous []  Dental caries present  []  Hearing normal  []  TMs with good light reflexes bilaterally         Neck [x]  Neck of normal appearance and trachea midline        []  No thyromegaly, nodules, or tenderness   []  Full neck ROM        Lymph []  No LAD in neck     []  No LAD in supraclavicular area     []  No LAD in axillae   []  No LAD in epitrochlear chains     []  No LAD in inguinal areas        CV []  RRR            [x]  No peripheral edema     [x]  Pedal pulses intact   [x]  No abnormal heart sounds appreciated   [x]  Extremities WWP   Regular, borderline tachy      Resp []  Normal WOB at rest    []  No breathlessness with speaking, no coughing  []  CTA anteriorly    []  CTA posteriorly    coarse breath sounds bilaterally      GI [x]  Normal inspection, NTND   [x]  NABS     [x]  No umbilical hernia on exam       []  No hepatosplenomegaly     []  Inspection of perineal and perianal areas normal        GU [x]  Normal external genitalia     [] No urinary catheter present in urethra   []  No CVA tenderness    []  No tenderness over renal allograft  Foley present      MSK [x]  No clubbing or cyanosis of hands       []  No vertebral point tenderness  [x]  No focal tenderness or abnormalities on palpation of joints in RUE, LUE, RLE, or LLE        Skin [x]  No rashes, lesions, or ulcers of visualized skin     [x]  Skin warm and dry to palpation    L anterior chest pressure  ulcer covered      Neuro [x]  Face expression symmetric  []  Sensation to light touch grossly intact throughout    [x]  Moves extremities equally    [x]  No tremor noted        []  CNs II-XII grossly intact     []  DTRs normal and symmetric throughout []  Gait unremarkable        Psych [x]  Appropriate affect       [x]  Fluent speech         []  Attentive, good eye contact  [x]  Oriented to person, place, time          []  Judgment and insight are appropriate           Data for Medical Decision Making     Recent Labs   Lab Units 03/18/21  0337 03/17/21  0338 03/16/21  1518   WBC 10*9/L 12.0*   < >  --    HEMOGLOBIN g/dL 7.9*   < >  --    PLATELET COUNT (1) 10*9/L 301   < >  --    NEUTRO ABS 10*9/L 10.3*   < >  --    LYMPHO ABS 10*9/L 0.8*   < >  --    EOSINO ABS 10*9/L 0.2   < >  --    SODIUM mmol/L 144   < > 146*   POTASSIUM mmol/L 3.7   < > 4.0   BUN mg/dL 29*   < > 41*   CREATININE mg/dL 1.61*   < > 0.96*   GLUCOSE mg/dL 92   < > 99   CALCIUM mg/dL 8.5*   < > 8.7   MAGNESIUM mg/dL 1.6   < > 1.9   PHOSPHORUS mg/dL 3.6   < >  --    BILIRUBIN TOTAL mg/dL 0.6   < >  --    AST U/L 40*   < >  --    ALT U/L 16   < >  --    IGG mg/dL  --   --  045*    < > = values in this interval not displayed.     I reviewed and noted the following labs: downtrending leukocytosis 12 . Anemia with Hgb 7.9 Cr elevated 1.63, decreasing. AST/ALT 40/16, IGG 632.    Microbiology:  Past cultures were reviewed in Epic and CareEverywhere.  Microbiology Results (last day)       Procedure Component Value Date/Time Date/Time    Blood Culture #1 [[id number]]  (Normal) Collected: 03/09/21 0458    Lab Status: Final result Specimen: Blood from 1 Peripheral Draw Updated: 03/14/21 0545     Blood Culture, Routine No Growth at 5 days    Blood Culture #2 [[id number]]  (Normal) Collected: 03/09/21 0458    Lab Status: Final result Specimen: Blood from 1 Peripheral Draw Updated: 03/14/21 0545     Blood Culture, Routine No Growth at 5 days    Lower Respiratory Culture [[id number]] Collected: 03/14/21 0254    Lab Status: Preliminary result Specimen: Sputum from Lung, Combined Updated: 03/14/21 0323     Gram Stain <10  Epithelial cells/LPF      >25 PMNS/LPF      1+ Gram positive cocci      Yeast present      Acceptable for culture    Narrative:      Specimen Source: Lung, Combined    Blood Culture, Adult [[id number]] Collected: 03/13/21 2339    Lab Status: In process  Specimen: Blood from 1 Peripheral Draw Updated: 03/13/21 2344    Blood Culture, Adult [[id number]] Collected: 03/13/21 2339    Lab Status: In process Specimen: Blood from 1 Peripheral Draw Updated: 03/13/21 2344          Imaging:  No results found.   XR Chest 12/7  Similar patchy and confluent consolidation in the right lung, greatest in the mid zone. Persistent left lower lung consolidation.   Small right pleural effusion. No pneumothorax.  Stable cardiomediastinal silhouette.      XR Chest 12/5 : Interval increase in right hemithorax consolidation. Persistent retrocardiac consolidation.No pleural effusion or pneumothorax    XR Chest 12/4: Slight increase in nonspecific left basilar opacities. Patchy and confluent heterogeneous opacities in the right upper lung are similar.      XR Chest Portable 03/06/2021  Marked interval worsening of airspace consolidation in the right upper and midlung zones. The left lung is clear.  No pleural effusion or pneumothorax.  Stable cardiomediastinal silhouette.    I have reviewed the images from 03/10/21 and agree with the findings.    Additional Studies:   (12/10) EKG QTc 427

## 2021-03-19 NOTE — Unmapped (Signed)
MICU Daily Progress Note     Date of Service: 03/19/2021    Problem List:   Principal Problem:    Acute respiratory failure with hypoxia (CMS-HCC)  Active Problems:    AKI (acute kidney injury) (CMS-HCC)    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)    Hypomagnesemia    Dyspnea    Moderate episode of recurrent major depressive disorder (CMS-HCC)    Fever    Immunocompromised (CMS-HCC)    Pneumonia    Low back pain    Sepsis (CMS-HCC)    Influenza A    Bacterial lobar pneumonia    CKD (chronic kidney disease)    Pneumonia of right lung due to methicillin resistant Staphylococcus aureus (MRSA) (CMS-HCC)  Resolved Problems:    * No resolved hospital problems. *    Interval history: Adam [name] with PMHx of ALL, COPD, prior MRSA bacteremia requiring intubation, MDD, lower back pain, tobacco use hx, presenting with??nausea, vomiting, fever initially found to be hypotensive and tachycardic at oncologists office, started on linezolid.??SOB worsened??in subsequent 24hrs??prompting presentation to the ED.??Admitted to the MICU due to c/f??acute hypoxic respiratory failure secondary to influenza infection with superimposed PNA.??    24 hr interval:  -Patient now on 2L LBNC  -Made step down status  -Removed Arterial Line  -BP increased to 150s, started home HTN   -Touched base with Oncology for when to restart Pronatinib   -Pain is adequately controlled, wants IV pain medications   -Floor Status    Neurological   Chronic??Pain??- Peripheral neuropathy  Has chronic pain at baseline from??ALL. Follows with [facility].   - Continue PRN oral dilaudid 2q 6hrs  - Continue schedule oxycodone to 10 TID  - Continue gabapentin 200 q8hrs     MDD  Holding home nortriptyline   -Restart home clonazepam 0.5 mg BID  ??  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 Stable.     Pulmonary   AHRF 2/2 Influenza w/ superimposed MRSA, Klebsiella PNA - Extubated 12/14  S/p intubation 12/5. P/F ratio less than 150 currently . c/f ARDS given acute onset, bilateral infiltrates. Patient extubated on 12/14. Oxygen and gases stable after extubation. Patient CT of described below.  - Droplet Precautions??  -ABX treatment outlined below  ??  COPD  20 ppy tobacco use hx, followed by Pulmonology outpt. Per 01/25/21 no prior exacerbations. Utilizes home albuterol.   - O2 Goal 88-92%  - cont ANORO Ellipta  - prn duonebs  ??  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 Underwent unsuccessful thoracentesis, at the time referred to CT surgery, at the time didn't think warranted surgical intervention. POCUS attempted yesterday, did not show any dense consolidations. Obtain Echo to assess for vegetations. CT scan on 12/14 showed multifocal consolidative airspace, also showed nonspecific small pleural effusion in left pleural space.   -TTE negative for vegetations.       Cardiovascular   HTN  Home regimen of amlodipine 10 mg and coreg 12.5 mg BID. Systolic on 150s, will restart coreg and hold off on amlodipine.    Renal   AKI  Likely prerenal vs ATN. Has not resolved with increasing amounts of fluids so ATN more likely. Stable UOP. Creatinine downtrending  - Consult nephrology, appreciate recommendations   -CTM    Infectious Disease/Autoimmune   Influenza c/b MRSA, Klebsiella PNA - Hx of MRSA Bacteremia  LRC growing MRSA on  12/2. though BCx remain negative. CT chest obtained showed worsening multifocal consolidative airspace, with potential empyema given trace loculated collections with lateral aspect of the right chest. Given these finding patients underwent bronscopy with BAL on 12/12 (with micro labs order below). Cell count showed 97% neutrophils. LRC from 12/11 grew klebsiella and MRSA. [facility] from Bronchoscopy positive for MRSA, discussed with ID will take time for clearance from system.   - Infectious disease Immunocomp consulted, follow reccs.  - Continue Linezolid  (12/3-???) final duration depends on clinical improvement and resolution of loculated effusion on CT  -Continue inhaled amikacin and oral levofloxacin 750 mg q24h (12/15-12/21)  -F/u AFB, aspergillus, RPP, pneumocystis, CMV, actinomyces, fungal , bronchial culture -> all negative to date  F/up BCx: NGTD at 5 days  LRC: Positive for staph aureus and Klebsiella       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  - continue home Valtrex 500mg  daily  - continue home Bactrium (normally BID Sat-Sun)  - holding TKI per heme reccs see below  ??  History of DRESS syndrome??2/2 cephalosporin  - consulted ICID for reccs re: abx regimen  - Avoid beta lactams    Cultures:  Blood Culture, Routine (no units)   Date Value   03/13/2021 No Growth at 5 days   03/13/2021 No Growth at 5 days     Urine Culture, Comprehensive (no units)   Date Value   03/06/2021 NO GROWTH     Lower Respiratory Culture (no units)   Date Value   03/14/2021 3+ Methicillin resistant Staphylococcus aureus (A)   03/14/2021 1+ Oropharyngeal Flora Isolated     WBC (10*9/L)   Date Value   03/19/2021 13.3 (H)     WBC, UA (/HPF)   Date Value   03/14/2021 4 (H)          FEN/GI   Constipation  Patient not able to make BM in 72 hours will increase bowel regimen given now BM in last 48 hours  -Start Lactulose BID  -increase senna 2 tablets BID      Malnutrition Assessment: Not done yet.  Body mass index is 32.2 kg/m??.  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 ??He subsequently had difficulty tolerating single agent dasatinib. S/p 4 cycles of ponatinib-blinatumomab.??He is now MRD-negative and on ponatinib monotherapy  - heme on board: Recc holding home TKI until malignant hematology evals for ready to resume. Patient's family will need update to bring in home supply then.    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 IgG 632 in recheck      DVT PPx  - subcutaneous heparin 5000 units q 8hr    Endocrine   NAI    Integumentary   Skin Rash over anterior abdomen   Pictures in media tab. Suspect due to injury during proning. Stable  - Continue to monitor  - Wound care consult    - WOCN consulted for high risk skin assessment Yes.  - cont pressure mitigating precautions per skin policy    Prophylaxis/LDA/Restraints/Consults   Can CVC be removed? N/A, no CVC present (including vascular catheter for HD or PLEX)   Can A-line be removed? No: frequent ABGs  Can Foley be removed? No: Need continuous I/O  Mobility plan: Step 1 - Range of motion  Feeding: Oral diet  Analgesia: Pain adequately controlled, weaning  Sedation SAT/SBT: N/A  Thromboembolic ppx: SQ heparin  Head of bed >30 degrees: Yes  Ulcer ppx: Not indicated   Glucose within target range: Yes, in range    Does patient need/have an active type/screen? No    RASS at goal? Yes  [place] Agitation Assessment Scale (RASS) : 0 (03/19/2021  9:00 AM)     Can antipsychotics be stopped? N/A, not on antipsychotics  CAM-ICU Result: Negative (11/09/2020  9:00 PM)      Would hospice care be appropriate for this patient? No, patient improving or expected to improve  Any unaddressed hospice/palliative care needs? no    Patient Lines/Drains/Airways Status     Active Active Lines, Drains, & Airways     Name Placement date Placement time Site Days    ETT  7.5 03/07/21  2131  -- 11    Peripheral IV 03/08/21 Right Antecubital 03/08/21  0102  Antecubital  11    Peripheral IV 03/17/21 Anterior;Left Forearm 03/17/21  1312  Forearm  1              Patient Lines/Drains/Airways Status     Active Wounds     Name Placement date Placement time Site Days    Wound 03/09/21 Pressure Injury Nose Mid Stage 2 03/09/21  1005  Nose  10    Wound 03/09/21 Skin Tear Chest Left 03/09/21  1030  Chest 10    Wound 03/16/21 Skin Tear Back Lower;Right 03/16/21  0849  Back  3                Goals of Care     Code Status: Full Code    Designated Healthcare Decision Maker:  Adam Keith current decisional capacity for healthcare decision-making is Full capacity. His designated Educational psychologist) is/are   HCDM (patient stated preference): Adam Keith, Adam Keith - Spouse - [phone].      Subjective     See 24 hr int    Objective     Vitals - past 24 hours  Temp:  [36.8 ??C (98.2 ??F)-37.3 ??C (99.1 ??F)] 36.8 ??C (98.2 ??F)  Heart Rate:  [74-98] 84  SpO2 Pulse:  [76-95] 85  Resp:  [15-34] 22  BP: (155)/(79) 155/79  FiO2 (%):  [32 %-65 %] 32 %  SpO2:  [81 %-100 %] 97 % Intake/Output  I/O last 3 completed shifts:  In: 970 [P.O.:970]  Out: 3437 [Urine:3435; Emesis/NG output:1; Stool:1]     Physical Exam:    General: Chronically ill gentleman turned over in bed  HEENT:??NCAT, reports no sinus tenderness. Moist mucus membranes.  CV:??sinus tachycardia, no m/r/g.  Pulm:??CTAB, no extra muscular movement  GI:??Abdomen distended, soft, non-tender. Bowel sounds present.  MSK:??No rigidity. Diffuse myalgia. No LE swelling/size discrepancy.  Skin:??Unchanged Abrasion to left anterior chest wall, no crepitus, linear erythema on anterior abdomen  Neuro:??Alert and oriented x3      Continuous Infusions:       Scheduled Medications:   ??? acetaminophen  1,000 mg Oral Q8H   ??? alteplase  1 mg Intravenous Once   ??? amikacin  500 mg Nebulization BID (RT)   ??? clonazePAM  0.5 mg Oral BID   ??? gabapentin  200 mg Oral TID   ??? heparin (porcine) for subcutaneous use  5,000 Units Subcutaneous [facility]   ??? flu vacc qs2022-23 6mos up(PF)  0.5 mL Intramuscular During hospitalization   ??? ipratropium-albuteroL  3 mL Nebulization Q6H (RT)   ??? [  START ON 03/20/2021] levoFLOXacin  750 mg Oral Daily   ??? lidocaine  1 patch Transdermal Daily   ??? linezolid  600 mg Oral Q12H SCH   ??? melatonin  3 mg Oral QPM   ??? OLANZapine zydis  5 mg Sublingual Nightly   ??? oxyCODONE  10 mg Oral Q8H SCH   ??? polyethylene glycol  17 g Oral BID   ??? senna  2 tablet Oral BID   ??? sulfamethoxazole-trimethoprim  1 tablet Oral 2 times per day on Sun Sat   ??? valACYclovir  500 mg Oral Daily       PRN medications:  albuterol, bisacodyL, carboxymethylcellulose sodium, HYDROmorphone, ondansetron    Data/Imaging Review: Reviewed in Epic and personally interpreted on 03/19/2021. See EMR for detailed results.

## 2021-03-19 NOTE — Unmapped (Signed)
Patient weaned from high flow nasal cannula to large bore cannula this shift. All treatments given without adverse reaction. Respiratory to continue to monitor.

## 2021-03-20 LAB — CBC W/ AUTO DIFF
BASOPHILS ABSOLUTE COUNT: 0.2 10*9/L — ABNORMAL HIGH (ref 0.0–0.1)
BASOPHILS RELATIVE PERCENT: 1.7 %
EOSINOPHILS ABSOLUTE COUNT: 0.2 10*9/L (ref 0.0–0.5)
EOSINOPHILS RELATIVE PERCENT: 1.5 %
HEMATOCRIT: 27.1 % — ABNORMAL LOW (ref 39.0–48.0)
HEMOGLOBIN: 8.7 g/dL — ABNORMAL LOW (ref 12.9–16.5)
LYMPHOCYTES ABSOLUTE COUNT: 1.2 10*9/L (ref 1.1–3.6)
LYMPHOCYTES RELATIVE PERCENT: 9.6 %
MEAN CORPUSCULAR HEMOGLOBIN CONC: 32.2 g/dL (ref 32.0–36.0)
MEAN CORPUSCULAR HEMOGLOBIN: 27.7 pg (ref 25.9–32.4)
MEAN CORPUSCULAR VOLUME: 86.2 fL (ref 77.6–95.7)
MEAN PLATELET VOLUME: 9.1 fL (ref 6.8–10.7)
MONOCYTES ABSOLUTE COUNT: 0.5 10*9/L (ref 0.3–0.8)
MONOCYTES RELATIVE PERCENT: 4.2 %
NEUTROPHILS ABSOLUTE COUNT: 10.1 10*9/L — ABNORMAL HIGH (ref 1.8–7.8)
NEUTROPHILS RELATIVE PERCENT: 83 %
PLATELET COUNT: 343 10*9/L (ref 150–450)
RED BLOOD CELL COUNT: 3.15 10*12/L — ABNORMAL LOW (ref 4.26–5.60)
RED CELL DISTRIBUTION WIDTH: 16.9 % — ABNORMAL HIGH (ref 12.2–15.2)
WBC ADJUSTED: 12.2 10*9/L — ABNORMAL HIGH (ref 3.6–11.2)

## 2021-03-20 LAB — HEPATIC FUNCTION PANEL
ALBUMIN: 2.1 g/dL — ABNORMAL LOW (ref 3.4–5.0)
ALKALINE PHOSPHATASE: 225 U/L — ABNORMAL HIGH (ref 46–116)
ALT (SGPT): 30 U/L (ref 10–49)
AST (SGOT): 49 U/L — ABNORMAL HIGH (ref <=34)
BILIRUBIN DIRECT: 0.3 mg/dL (ref 0.00–0.30)
BILIRUBIN TOTAL: 0.4 mg/dL (ref 0.3–1.2)
PROTEIN TOTAL: 5.7 g/dL (ref 5.7–8.2)

## 2021-03-20 LAB — BASIC METABOLIC PANEL
ANION GAP: 12 mmol/L (ref 5–14)
BLOOD UREA NITROGEN: 16 mg/dL (ref 9–23)
BUN / CREAT RATIO: 12
CALCIUM: 8.2 mg/dL — ABNORMAL LOW (ref 8.7–10.4)
CHLORIDE: 107 mmol/L (ref 98–107)
CO2: 22 mmol/L (ref 20.0–31.0)
CREATININE: 1.33 mg/dL — ABNORMAL HIGH
EGFR CKD-EPI (2021) MALE: 69 mL/min/{1.73_m2} (ref >=60)
GLUCOSE RANDOM: 99 mg/dL (ref 70–179)
POTASSIUM: 3.8 mmol/L (ref 3.4–4.8)
SODIUM: 141 mmol/L (ref 135–145)

## 2021-03-20 LAB — MAGNESIUM: MAGNESIUM: 1.6 mg/dL (ref 1.6–2.6)

## 2021-03-20 LAB — PHOSPHORUS: PHOSPHORUS: 3.5 mg/dL (ref 2.4–5.1)

## 2021-03-20 MED ORDER — LINEZOLID 600 MG TABLET
ORAL_TABLET | Freq: Two times a day (BID) | ORAL | 0 refills | 28 days | Status: CP
Start: 2021-03-20 — End: 2021-04-17

## 2021-03-20 MED ADMIN — magnesium sulfate in water 2 gram/50 mL (4 %) IVPB 2 g: 2 g | INTRAVENOUS | @ 02:00:00 | Stop: 2021-03-19

## 2021-03-20 MED ADMIN — clonazePAM (KlonoPIN) disintegrating tablet 0.5 mg: .5 mg | ORAL | @ 02:00:00

## 2021-03-20 MED ADMIN — heparin (porcine) 5,000 unit/mL injection 5,000 Units: 5000 [IU] | SUBCUTANEOUS | @ 11:00:00 | Stop: 2021-03-20

## 2021-03-20 MED ADMIN — carvediloL (COREG) tablet 12.5 mg: 12.5 mg | ORAL | @ 15:00:00 | Stop: 2021-03-20

## 2021-03-20 MED ADMIN — gabapentin (NEURONTIN) capsule 200 mg: 200 mg | ORAL | @ 15:00:00 | Stop: 2021-03-20

## 2021-03-20 MED ADMIN — amikacin (AMIKIN) 500 mg in sodium chloride (NS) 0.9 % 4 mL inhalation: 500 mg | RESPIRATORY_TRACT | @ 02:00:00 | Stop: 2021-03-24

## 2021-03-20 MED ADMIN — oxyCODONE (OxyCONTIN) 12 hr crush resistant ER/CR tablet 10 mg: 10 mg | ORAL | @ 11:00:00 | Stop: 2021-03-20

## 2021-03-20 MED ADMIN — gabapentin (NEURONTIN) capsule 200 mg: 200 mg | ORAL | @ 18:00:00 | Stop: 2021-03-20

## 2021-03-20 MED ADMIN — clonazePAM (KlonoPIN) disintegrating tablet 0.5 mg: .5 mg | ORAL | @ 15:00:00 | Stop: 2021-03-20

## 2021-03-20 MED ADMIN — sulfamethoxazole-trimethoprim (BACTRIM DS) 800-160 mg tablet 160 mg of trimethoprim: 1 | ORAL | @ 15:00:00 | Stop: 2021-03-20

## 2021-03-20 MED ADMIN — acetaminophen (TYLENOL) tablet 1,000 mg: 1000 mg | ORAL | @ 02:00:00

## 2021-03-20 MED ADMIN — levoFLOXacin (LEVAQUIN) tablet 750 mg: 750 mg | ORAL | @ 15:00:00 | Stop: 2021-03-20

## 2021-03-20 MED ADMIN — gabapentin (NEURONTIN) capsule 200 mg: 200 mg | ORAL | @ 02:00:00

## 2021-03-20 MED ADMIN — oxyCODONE (OxyCONTIN) 12 hr crush resistant ER/CR tablet 10 mg: 10 mg | ORAL | @ 18:00:00 | Stop: 2021-03-20

## 2021-03-20 MED ADMIN — valACYclovir (VALTREX) tablet 500 mg: 500 mg | ORAL | @ 15:00:00 | Stop: 2021-03-20

## 2021-03-20 MED ADMIN — heparin (porcine) 5,000 unit/mL injection 5,000 Units: 5000 [IU] | SUBCUTANEOUS | @ 02:00:00

## 2021-03-20 MED ADMIN — carvediloL (COREG) tablet 12.5 mg: 12.5 mg | ORAL | @ 02:00:00

## 2021-03-20 MED ADMIN — oxyCODONE (OxyCONTIN) 12 hr crush resistant ER/CR tablet 10 mg: 10 mg | ORAL | @ 02:00:00 | Stop: 2021-03-28

## 2021-03-20 MED ADMIN — HYDROmorphone (DILAUDID) tablet 2 mg: 2 mg | ORAL | @ 01:00:00 | Stop: 2021-04-01

## 2021-03-20 MED ADMIN — acetaminophen (TYLENOL) tablet 1,000 mg: 1000 mg | ORAL | @ 18:00:00 | Stop: 2021-03-20

## 2021-03-20 MED ADMIN — acetaminophen (TYLENOL) tablet 1,000 mg: 1000 mg | ORAL | @ 11:00:00 | Stop: 2021-03-20

## 2021-03-20 MED ADMIN — ipratropium-albuteroL (DUO-NEB) 0.5-2.5 mg/3 mL nebulizer solution 3 mL: 3 mL | RESPIRATORY_TRACT | @ 15:00:00 | Stop: 2021-03-20

## 2021-03-20 MED ADMIN — amikacin (AMIKIN) 500 mg in sodium chloride (NS) 0.9 % 4 mL inhalation: 500 mg | RESPIRATORY_TRACT | @ 15:00:00 | Stop: 2021-03-20

## 2021-03-20 MED ADMIN — linezolid (ZYVOX) tablet 600 mg: 600 mg | ORAL | @ 02:00:00 | Stop: 2021-03-26

## 2021-03-20 MED ADMIN — OLANZapine zydis (ZyPREXA) disintegrating tablet 5 mg: 5 mg | SUBLINGUAL | @ 02:00:00

## 2021-03-20 MED ADMIN — linezolid (ZYVOX) tablet 600 mg: 600 mg | ORAL | @ 15:00:00 | Stop: 2021-03-20

## 2021-03-20 MED ADMIN — ipratropium-albuteroL (DUO-NEB) 0.5-2.5 mg/3 mL nebulizer solution 3 mL: 3 mL | RESPIRATORY_TRACT | @ 02:00:00

## 2021-03-20 NOTE — Unmapped (Signed)
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 *          Cross cover summary     - No acute overnight events  - Replaced magnesium    Eppie Gibson, MD

## 2021-03-20 NOTE — Unmapped (Signed)
Pt Ox4 this shift.  Rass 0 to -1 this shift.   One PRN given for pain.  Pt had 1 large BM just prior to the start of this shift.  Pt voiding in urinal.   Pt continues to have diminished appetite.    Pt's wife has been at the bedside throughout the shift, and is actively engaged in pt care.    Pt on RA currently; 1-3L  used intermittently throughout the shift.  Afebrile; TMax 36.8.

## 2021-03-20 NOTE — Unmapped (Signed)
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 *    Interval history: Adam [name] with PMHx of ALL, COPD, prior MRSA bacteremia requiring intubation, MDD, lower back pain, tobacco use hx, presenting with??nausea, vomiting, fever initially found to be hypotensive and tachycardic at oncologists office, started on linezolid.??SOB worsened??in subsequent 24hrs??prompting presentation to the ED.??Admitted to the MICU due to c/f??acute hypoxic respiratory failure secondary to influenza infection with superimposed PNA.??    24 hr interval:  - Patient now on RA (2L overnight w/hx of OSA)  - and planning to go home not interested in PT    Neurological   Chronic??Pain??- Peripheral neuropathy  Has chronic pain at baseline from??ALL. Follows with [facility].   - Continue PRN oral dilaudid 2q 6hrs  - Continue schedule oxycodone to 10 TID  - Continue gabapentin 200 q8hrs     MDD  Holding home nortriptyline   -Restart home clonazepam 0.5 mg BID  ??  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 Stable.     Pulmonary   AHRF 2/2 Influenza w/ superimposed MRSA, Klebsiella PNA - Extubated 12/14  S/p intubation 12/5. P/F ratio less than 150 currently . c/f ARDS given acute onset, bilateral infiltrates. Patient extubated on 12/14. Oxygen and gases stable after extubation. Patient CT of described below.  - Droplet Precautions??  -ABX treatment outlined below  ??  COPD  20 ppy tobacco use hx, followed by Pulmonology outpt. Per 01/25/21 no prior exacerbations. Utilizes home albuterol.   - O2 Goal 88-92%  - cont ANORO Ellipta  - prn duonebs  ??  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 Underwent unsuccessful thoracentesis, at the time referred to CT surgery, at the time didn't think warranted surgical intervention. POCUS attempted yesterday, did not show any dense consolidations. Obtain Echo to assess for vegetations. CT scan on 12/14 showed multifocal consolidative airspace, also showed nonspecific small pleural effusion in left pleural space.   -TTE negative for vegetations.       Cardiovascular   HTN  Home regimen of amlodipine 10 mg and coreg 12.5 mg BID. Systolic on 150s, will restart coreg and hold off on amlodipine.    Renal   AKI  Likely prerenal vs ATN. Has not resolved with increasing amounts of fluids so ATN more likely. Stable UOP. Creatinine downtrending  - Consult nephrology, appreciate recommendations   -CTM    Infectious Disease/Autoimmune   Influenza c/b MRSA, Klebsiella PNA - Hx of MRSA Bacteremia  LRC growing MRSA on 12/2. though BCx remain negative. CT chest obtained showed worsening multifocal consolidative airspace, with potential empyema given trace loculated collections with lateral aspect of the  right chest. Given these finding patients underwent bronscopy with BAL on 12/12 (with micro labs order below). Cell count showed 97% neutrophils. LRC from 12/11 grew klebsiella and MRSA. [facility] from Bronchoscopy positive for MRSA, discussed with ID will take time for clearance from system.   - Infectious disease Immunocomp consulted, follow reccs.  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  History of DRESS syndrome??2/2 cephalosporin  - consulted ICID for reccs re: abx regimen  - Avoid beta lactams    Cultures:  Blood Culture, Routine (no units)   Date Value   03/13/2021 No Growth at 5 days   03/13/2021 No Growth at 5 days     Urine Culture, Comprehensive (no units)   Date Value   03/06/2021 NO GROWTH     Lower Respiratory Culture (no units)   Date Value   03/14/2021 3+ Methicillin resistant Staphylococcus aureus (A)   03/14/2021 1+ Oropharyngeal Flora Isolated     WBC (10*9/L)   Date Value   03/20/2021 12.2 (H)     WBC, UA (/HPF)   Date Value   03/14/2021 4 (H)          FEN/GI   Constipation  Patient not able to make BM in 72 hours will increase bowel regimen given now BM in last 48 hours  -Start Lactulose BID  -increase senna 2 tablets BID      Malnutrition Assessment: Not done yet.  Body mass index is 32.2 kg/m??.  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 ??He subsequently had difficulty tolerating single agent dasatinib. S/p 4 cycles of ponatinib-blinatumomab.??He is now MRD-negative and on ponatinib monotherapy  - heme on board: Recc holding home TKI until malignant hematology evals for ready to resume. Patient's family will need update to bring in home supply then.    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 IgG 632 in recheck      DVT PPx  - subcutaneous heparin 5000 units q 8hr    Endocrine   NAI    Integumentary   Skin Rash over anterior abdomen   Pictures in media tab. Suspect due to injury during proning. Stable  - Continue to monitor  - Wound care consult    - WOCN consulted for high risk skin assessment Yes.  - cont pressure mitigating precautions per skin policy    Prophylaxis/LDA/Restraints/Consults   Can CVC be removed? N/A, no CVC present (including vascular catheter for HD or PLEX)   Can A-line be removed? No: frequent ABGs  Can Foley be removed? No: Need continuous I/O  Mobility plan: Step 1 - Range of motion    Feeding: Oral diet  Analgesia: Pain adequately controlled, weaning  Sedation SAT/SBT: N/A  Thromboembolic ppx: SQ heparin  Head of bed >30 degrees: Yes  Ulcer ppx: Not indicated   Glucose within target range: Yes, in range    Does patient  need/have an active type/screen? No    RASS at goal? Yes  [place] Agitation Assessment Scale (RASS) : 0 (03/20/2021  8:00 AM)     Can antipsychotics be stopped? N/A, not on antipsychotics  CAM-ICU Result: Negative (11/09/2020  9:00 PM)      Would hospice care be appropriate for this patient? No, patient improving or expected to improve  Any unaddressed hospice/palliative care needs? no    Patient Lines/Drains/Airways Status     Active Active Lines, Drains, & Airways     Name Placement date Placement time Site Days    Peripheral IV 03/17/21 Anterior;Left Forearm 03/17/21  1312  Forearm  2              Patient Lines/Drains/Airways Status     Active Wounds     Name Placement date Placement time Site Days    Wound 03/09/21 Pressure Injury Nose Mid Stage 2 03/09/21  1005  Nose  11    Wound 03/09/21 Skin Tear Chest Left 03/09/21  1030  Chest  11    Wound 03/16/21 Skin Tear Back Lower;Right 03/16/21  0849  Back  4                Goals of Care     Code Status: Full Code    Designated Healthcare Decision Maker:  Adam Keith current decisional capacity for healthcare decision-making is Full capacity. His designated Educational psychologist) is/are   HCDM (patient stated preference): Adam Keith, Adam Keith - Spouse - [phone].      Subjective     See 24 hr int    Objective     Vitals - past 24 hours  Temp:  [36.5 ??C (97.7 ??F)-36.9 ??C (98.4 ??F)] 36.8 ??C (98.2 ??F)  Heart Rate:  [64-87] 70  SpO2 Pulse:  [64-85] 65  Resp:  [16-30] 21  BP: (140-151)/(57-96) 145/79  SpO2:  [90 %-100 %] 96 % Intake/Output  I/O last 3 completed shifts:  In: 300 [P.O.:300]  Out: 4182 [Urine:4180; Emesis/NG output:1; Stool:1]     Physical Exam:    General: Chronically ill gentleman turned over in bed  HEENT:??NCAT, reports no sinus tenderness. Moist mucus membranes.  CV:??sinus tachycardia, no m/r/g.  Pulm:??CTAB, no extra muscular movement  GI:??Abdomen distended, soft, non-tender. Bowel sounds present.  MSK:??No rigidity. Diffuse myalgia. No LE swelling/size discrepancy.  Skin:??Unchanged Abrasion to left anterior chest wall, no crepitus, linear erythema on anterior abdomen  Neuro:??Alert and oriented x3      Continuous Infusions:   ??? IP okay to treat         Scheduled Medications:   ??? acetaminophen  1,000 mg Oral Q8H   ??? alteplase  1 mg Intravenous Once   ??? amikacin  500 mg Nebulization BID (RT)   ??? carvediloL  12.5 mg Oral BID   ??? clonazePAM  0.5 mg Oral BID   ??? gabapentin  200 mg Oral TID   ??? heparin (porcine) for subcutaneous use  5,000 Units Subcutaneous [facility]   ??? flu vacc qs2022-23 6mos up(PF)  0.5 mL Intramuscular During hospitalization   ??? ipratropium-albuteroL  3 mL Nebulization Q6H (RT)   ??? lactulose  20 g Oral BID   ??? levoFLOXacin  750 mg Oral Daily   ??? lidocaine  1 patch Transdermal Daily   ??? linezolid  600 mg Oral Q12H SCH   ??? melatonin  3 mg Oral QPM   ??? OLANZapine zydis  5 mg Sublingual Nightly   ??? oxyCODONE  10 mg  Oral Q8H [facility]   ??? senna  2 tablet Oral BID   ??? sulfamethoxazole-trimethoprim  1 tablet Oral 2 times per day on Sun Sat   ??? valACYclovir  500 mg Oral Daily       PRN medications:  albuterol, bisacodyL, carboxymethylcellulose sodium, HYDROmorphone, IP okay to treat, ondansetron    Data/Imaging Review: Reviewed in Epic and personally interpreted on 03/20/2021. See EMR for detailed results.

## 2021-03-20 NOTE — Unmapped (Addendum)
Malignant Hematology Consult Note    Requesting Attending Physician :  [name], MD  Service Requesting Consult : Medical ICU (MDI)  Reason for Consult: B-ALL  Primary Oncologist: Dr Senaida Ores    Assessment: Adam Keith is a 40 y.o. 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 Malignant hematology was consulted for evaluation of whether to continue TKI for B-ALL.     #Ph(+) B-ALL  Oncology history:  S/p induction per GRAAPH-2005. Course complicated by septic shock, candida krusei fungemia, MRSA bacteremia with septic emboli c/b acute renal failure and respiratory distress requiring dialysis and intubation. ??Post-induction bmbx (day 29) demonstrated flow-based MRD-negative remission but persistent low-level BCR-ABL. ??Subsequently had difficulty tolerating single agent dasatinib (as a bridge to planned ponatinib-blinatumomab--had fluid retention and pleural effusion). ??He??then completed????4 cycles??of ponatinib-blinatumomab, initiated for treatment of MRD in the setting of poor tolerance of standard therapy.??He is now MRD-negative by flow with BCR-ABL level of 0.002% on marrow by PCR (01/11/21). Patient is now on ponatinib 30mg  monotherapy (since 01/2021).  Plan:  - Resume ponatinib 12/17 (wife bringing it in this afternoon)  - Will need non-urgent bone marrow biopsy in the coming weeks (as outpatient) to assess adequacy of this dose.     #Acute hypoxemic respiratory failure 2/2 MRSA & Klebsiella oxytoca pneumonia - Influenza A - Hypogammaglobulinemia   Underwent bronch + BAL on 03/16/21 with the above findings. Intubated 03/07/21-03/17/21, currently on HFNC.   - On linezolid per ICID (12/3-) & Amikacin + Levofloxacin (12/14-).  S/p oseltamivir (12/3-12/7)   - s/p IVIG (12/7)   - Bactrim and valtrex prophylaxis    Recommendations:   - Appreciate excellent care of MICU and consultation from ICID.  - Resume ponatinib (we will order), pharmacy will use home supply     This patient has been staffed with Dr. Barbette Merino. These recommendations were discussed with the primary team.     Please contact the malignant hematology fellow at [phone] with any further questions.    [name], MD PhD  Hematology/Oncology, PGY-4    -------------------------------------------------------------    HPI: Adam Keith is a 40 y.o. 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 Malignant hematology was consulted for evaluation of whether to continue TKI for B-ALL.     Seen in ID clinic 12/2 with fever/cough, positive for flu A, CXR concerning for pneumonia, sent home on linezolid. Re-presented to ED on 12/3 with worsening symptoms, including hypotension, hypoxia. WBC 20.6. Admitted to MICU. Overnight had improvement in lactate with IVFs (normalized, initially 4.1 on admission).  Febrile to 38.6 overnight.  Earlymorning ~5am, had desaturation to 80% and required 100% FiO2 briefly, was able to be weaned to 75% FiO2 when our team saw him in MICU ~9:30 am.  6AM ABG 7.36/pO2 96/PCO2 35. Was intubated from 12/5 - 12/15     Now on room air during day and 2L [place] at night. Overall feels well. Reports wife is bringing in home ponatanib this afternoon along with some clothes. He is hoping to leave the hospital soon.    Review of Systems: All positive and pertinent negatives are noted in the HPI; otherwise all other systems are negative    Oncologic History:  Oncology  History Overview Note   Referring/Local Oncologist: None    Diagnosis:Ph+ ALL    Genetics:    Karyotype/FISH:Abnormal Karyotype: 46,XY,t(9;22)(q34;q11.2)[1]/45,XY,der(7;9)(q10;q10)t(9;22)(q34;q11.2),der(22)t(9;22)[11]/46,sdl,+der(22)t(9;22)[5]/46,XY[3]     Abnormal FISH: A BCR/ABL1 interphase FISH assay shows an abnormal signal pattern in 97% of the 100 cells scored. 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  The findings support a diagnosis of leukemia and have implications for targeted therapy and for monitoring residual disease.      Molecular Genetics:  BCR-ABL1 p210 transcripts were detected at a level of 46.479 IS% ratio in bone marrow.  BCR-ABL1 p190 transcripts were detected at a level of 4 in 100,000 cells in bone marrow.    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)     01/24/2020 - 04/02/2020 Chemotherapy    IP/OP LEUKEMIA GRAAPH-2005 + RITUXIMAB < 60 YO  rituximab hypercvad (odd and even course)     02/24/2020 Remission    CR with PCR MRD+; marrow showing 70% blasts with <1% blasts, negative flow MRD, BCR-ABL p210 PCR 0.197%; CNS negative for blasts     03/09/2020 Progression    CSF - rare blast ID'ed - IT chemo given    03/13/20 - IT chemo, CSF negative     04/16/2020 Biopsy    BM Bx with 40-50% cellularity, <1% blasts, MRD flow cytometry negative, BCR-ABL p210 transcripts 0.036%     05/28/2020 - 05/28/2020 Chemotherapy    OP AML - CNS THERAPY (INTRATHECAL CYTARABINE, INTRATHECAL METHOTREXATE, OR INTRATHECAL TRIPLE)  Select one of the following: cytarabine IT 100 mg with hydrocortisone 50 mg, cytarabine IT 40 mg with methotrexate 15 mg with hydrocortisone 50 mg, OR methotrexate IT 12 mg with hydrocortisone 50 mg     06/19/2020 -  Chemotherapy    IP/OP LEUKEMIA BLINATUMOMAB 7-DAY INFUSION (MINIMAL RESIDUAL DISEASE; WT >= 22 KG) (HOME INFUSION)      Cycles 1*-4: Blinatumomab 28 mcg/day Days 1-28 of 6-week cycle.  *Given in the inpatient setting on Days 1-3 on Cycle 1 and Days 1-2 on Cycle 2, while other treatment days are given in the outpatient setting.    Cycle 1 MRD Dosing     07/18/2020 Adverse Reaction    Hospitalization: fevers. Pneumonia     07/23/2020 Biopsy    Diagnosis  Bone marrow, right iliac, aspiration and biopsy  -   Normocellular bone marrow (50%) with trilineage hematopoiesis and 1% blasts by manual aspirate differential  -   Flow cytometry MRD analysis reveals no definitive immunophenotypic evidence of residual B lymphoblastic leukemia   - BCR-ABL p210 0.006%         08/10/2020 -  Chemotherapy    Cycle 2 blinatumomab-ponatinib  Ponatinib 30mg     1 IT per cycle     09/21/2020 Adverse Reaction    Covid-19, symptomatic but not requiring hospitalization.     10/06/2020 -  Chemotherapy    Cycle 3 blinatumomab-ponatinib  Ponatinib 30mg        11/01/2020 Adverse Reaction    Hospitalization: MRSA Pneumonia requiring intubation    Blinatumomab stopped.  Ponatinib held until he stabilized.       12/14/2020 -  Chemotherapy    Cycle 4 blinatumomab  28 mcg/day days 1-28, ponatinib 30 mg per day IT triple therapy day 29     ALL (acute lymphoblastic leukemia) (CMS-HCC)   06/11/2020 -  Chemotherapy    IP/OP LEUKEMIA BLINATUMOMAB 7-DAY INFUSION (MINIMAL RESIDUAL DISEASE; WT >= 22 KG) (HOME INFUSION)  Cycles 1*-4: Blinatumomab 28 mcg/day Days 1-28 of 6-week cycle.  *Given in the inpatient setting on Days 1-3 on Cycle 1 and Days 1-2 on Cycle 2, while other treatment days are given in the outpatient setting.     09/21/2020 Initial Diagnosis    ALL (acute lymphoblastic leukemia) (CMS-HCC)     Acute lymphoblastic leukemia in remission (CMS-HCC)   06/11/2020 -  Chemotherapy    IP/OP LEUKEMIA BLINATUMOMAB 7-DAY INFUSION (MINIMAL RESIDUAL DISEASE; WT >= 22 KG) (HOME INFUSION)  Cycles 1*-4: Blinatumomab 28 mcg/day Days 1-28 of 6-week cycle.  *Given in the inpatient setting on Days 1-3 on Cycle 1 and Days 1-2 on Cycle 2, while other treatment days are given in the outpatient setting.     11/09/2020 Initial Diagnosis    Acute lymphoblastic leukemia in remission (CMS-HCC)         Past Medical History:   Diagnosis Date   ??? 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 BONE MARROW BIOPSY & ASPIRATION  01/21/2020        ??? CHG [place], CHEST,REAL TIME  11/20/2020    Procedure: ULTRASOUND, CHEST, REAL TIME WITH IMAGE DOCUMENTATION;  Surgeon: Jerelyn Charles, MD;  Location: [facility];  Service: Pulmonary   ??? IR INSERT PORT AGE GREATER THAN 5 YRS  03/10/2020    IR INSERT PORT AGE GREATER THAN 5 YRS 03/10/2020 [name], MD IMG VIR [facility]   ??? PR BRONCHOSCOPY,DIAGNOSTIC W LAVAGE Bilateral 07/20/2020    Procedure: BRONCHOSCOPY, RIGID OR FLEXIBLE, INCLUDE FLUOROSCOPIC GUIDANCE WHEN PERFORMED; W/BRONCHIAL ALVEOLAR LAVAGE WITH MODERATE SEDATION;  Surgeon: Dellis Filbert, MD;  Location: [facility];  Service: Pulmonary       Family History   Problem Relation Age of Onset   ??? Melanoma Neg Hx    ??? Basal cell carcinoma Neg Hx    ??? Squamous cell carcinoma Neg Hx         Social History     Socioeconomic History   ??? Marital status: Married   Tobacco Use   ??? Smoking status: Former     Packs/day: 2.00     Types: Cigarettes     Quit date: 01/20/2020     Years since quitting: 1.1   ??? Smokeless tobacco: Former     Types: Financial planner   ??? Vaping Use: Never used   Other Topics Concern   ??? Do you use sunscreen? No   ??? Tanning bed use? No   ??? Are you easily burned? Yes   ??? Excessive sun exposure? No   ??? Blistering sunburns? Yes     Social Determinants of Health     Financial Resource Strain: Low Risk    ??? Difficulty of Paying Living Expenses: Not very hard   Food Insecurity: No Food Insecurity   ??? Worried About Running Out of Food in the Last Year: Never true   ??? Ran Out of Food in the Last Year: Never true   Transportation Needs: No Transportation Needs   ??? Lack of Transportation (Medical): No   ???  Lack of Transportation (Non-Medical): No       Social History     Social History Narrative   ??? Not on file       Allergies: is allergic to bupropion hcl, cefepime, ceftaroline fosamil, dapsone, onion, vancomycin analogues, bismuth subsalicylate, and furosemide.    Medications:   Meds:  ??? acetaminophen  1,000 mg Oral Q8H   ??? alteplase  1 mg Intravenous Once   ??? amikacin  500 mg Nebulization BID (RT)   ??? carvediloL  12.5 mg Oral BID   ??? clonazePAM  0.5 mg Oral BID   ??? gabapentin  200 mg Oral TID   ??? heparin (porcine) for subcutaneous use  5,000 Units Subcutaneous [facility]   ??? flu vacc qs2022-23 6mos up(PF)  0.5 mL Intramuscular During hospitalization   ??? ipratropium-albuteroL  3 mL Nebulization Q6H (RT)   ??? lactulose  20 g Oral BID   ??? levoFLOXacin  750 mg Oral Daily   ??? lidocaine  1 patch Transdermal Daily   ??? linezolid  600 mg Oral Q12H SCH   ??? melatonin  3 mg Oral QPM   ??? OLANZapine zydis  5 mg Sublingual Nightly   ??? oxyCODONE  10 mg Oral Q8H SCH   ??? senna  2 tablet Oral BID   ??? sulfamethoxazole-trimethoprim  1 tablet Oral 2 times per day on Sun Sat   ??? valACYclovir  500 mg Oral Daily     Continuous Infusions:  ??? IP okay to treat       PRN Meds:.albuterol, bisacodyL, carboxymethylcellulose sodium, HYDROmorphone, IP okay to treat, ondansetron    Objective:   Vitals: Temp:  [36.5 ??C (97.7 ??F)-36.9 ??C (98.4 ??F)] 36.6 ??C (97.9 ??F)  Heart Rate:  [64-88] 87  SpO2 Pulse:  [64-86] 85  Resp:  [15-30] 16  BP: (140-151)/(57-96) 145/69  MAP (mmHg):  [79-111] 93  SpO2:  [90 %-99 %] 99 %    Physical Exam:  BP 145/69  - Pulse 87  - Temp 36.6 ??C (97.9 ??F) (Oral)  - Resp 16  - Ht 185 cm (6' 0.84)  - Wt (!) 110.2 kg (242 lb 15.2 oz)  - SpO2 99%  - BMI 32.20 kg/m??    General appearance - Nontoxic appearing man sitting upright in ICU bed in no acute distress  Mental status - alert, awakens to voice.   Nose - [place] in place.   Neck - supple   Pulmonary - Improving aeration.   Cardiovascular- Sinus tachycardia   Gastrointestinal - Obese abdomen, distended, nontender.  Neurological - alert, conversant  Musculoskeletal - no joint tenderness, deformity or swelling   Skin - no suspicious skin lesions noted       Test Results  Recent Labs     03/18/21  0337 03/19/21  0531 03/20/21  0720   WBC 12.0* 13.3* 12.2*   NEUTROABS 10.3* 11.6* 10.1*   HGB 7.9* 8.3* 8.7*   PLT 301 308 343       Imaging: Radiology studies were personally reviewed

## 2021-03-20 NOTE — Unmapped (Signed)
IMMUNOCOMPROMISED HOST INFECTIOUS DISEASE PROGRESS NOTE      Adam Keith is being seen in consultation at the request of [name], MD for evaluation of pneumonia.    Assessment/Recommendations:    Adam 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 Patient received linezolid and oseltamavir (s/p 5d, through 12/7), as well as initial aztreonam. We are concerned that his persistent fevers may be secondary to a MRSA pulmonary abscess/empyema, and recommend when possible for CT chest to evaluate this further. Given his ongoing fevers while on appropriate therapy would also be reasonable to CT abdomen and pelvis to assess for any other potential sites of infection (although fevers could certainly be attributed to his severe pneumonia.) Patient tolerated IVIG 12/9, continues to be febrile.     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 krusei fungemia  - Prior chemotherapy: [id number] induction with dasatinib (vincristine, dexamethasone and dasatinib); C1D1 01/24/2020; difficulty tolerating single-agent dasatinib  - Current chemotherapy: blinatumomab (anti-CD19/CD3) + ponatinib (TKI) s/p C4 on 12/14/20; 01/2021 now on ponatinib monotherapy maintenance  - 01/11/21: Normocellular bone marrow (30% overall) with trilineage hematopoiesis and less than 1% blasts by manual aspirate differential; Flow cytometry MRD analysis reveals no definitive immunophenotypic evidence of residual B lymphoblastic leukemia; BCR-ABL p210 transcripts were detected at a level of 0.002 IS % ratio in bone marrow.    # Hypogammaglobulimia  - 11/01/20 IgG 266 declined IVIG  - 03/11/21 IgG 148 - IVIG infusion stopped as patient developed rigors, tachycardia and HTN during infusion despite pre-medication   - 03/12/21 IgG 213 - completed IVIG with premedications and slower rate of infusion  -03/17/21 IgG 632    # AKI  Estimated Creatinine Clearance: 87.7 mL/min (A) (based on SCr of 1.44 mg/dL (H)).  - b/l Cr ~1.6-1.8  - 12/9 Nephrology considering CRRT.     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, industrial/product     Infection History  Recurrent (x3) severe MRSA infections (see below)    Active infections:   # Influenza infection without seasonal vaccination with superimposed MRSA bacterial pneumonia 03/05/21, resolving  # Klebsiella oxytoca  - ventilator associated pneumonia 03/14/21, resolving  - 02/28/21 Fever and respiratory symptoms   - 12/2 : RPP - Flu +ve  - 12/2 : CXR - Right Lower Lobe airspace disease  - 12/3: CXR worsened opacities  - 12/5: intubated for worsening hypoxemic respiratory failure with concern for ARDS  - 12/6 tracheal aspirate: MRSA  - 12/9 IVIg  - 12/11 Lower resp Cx - staph aureus 2+ , klebsiella oxytoca ( sens : Levofloxacin, amikacin, tobramycin)  - 12/11 CT Chest : Multifocal consolidative airspace disease, nonspecific small pleural effusion in the dependent aspect of the left pleural space and trace loculated collections within the lateral aspect of the right chest.  -  12/11 CT abdomen: Mild circumferential wall thickening with adjacent soft tissue stranding involving the distal sigmoid colon/rectum. Findings are nonspecific although can be seen in the setting of early colitis/proctitis  Tx : Linezolid /Oseltamivir---> Linezolid/aztreonam/flagyl/azithromycin/oseltamivir 12/3 --> linezolid/aztreonam/osteltamivir 12/3 -->linezolid [name] 12/5--->linezolid 12/6 -->linezolid/aztreonam 11/13--->linezolid/levofloxacin/inhaled amikacin 12/14    #Left upper chest, pressure ulcer from proning 03/11/21, improving    Prior infections:  #MRSA RLL PNA c/b bacteremia 11/01/2020, probable right empyema 8/12/202; presumed relapsed RML MRSA pneumonia 11/18/2020  - 8/19 Failed thoracocentesis by IP  - 9/26 CT chest parenchyma without e/o infection, persistent but decreased loculated effusions on right  - s/p MRSA antibiotics until 01/04/2021  #Persistent right chest pain 12/29/20; Low grade fever, weakness 01/28/21  - 02/02/2021 - Dr. 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    Treatment  # Influenza A with superimposed  MRSA bacterial pneumonia  CONTINUE linezolid 600 mg bid for treatment of MRSA pneumonia  Anticipate a minimum duration of 4-6 weeks , final duration depends on a) clinical improvement and resolution of loculated effusion on CT and b) ongoing immunosuppression needs. Given 3 episodes of MRSA pneumonia requiring hospitalization, he is a candidate for longterm suppression if immunosuppression is ongoing.    #Klebsiella oxytoca - ventilator associated pneumonia  Continue inhaled amikacin liposome inhalation - pharmacy assistance appreciated for dosing  Continue levofloxacin 750 mg q24h  Treat for a duration 7 days [STOP Date: 12/21]    Prophylaxis  Cont valacyclovir, bactrim  Monthly IVIG guided by total IgG  Will need COVID bivalent before discharge. Influenza vaccine in 3 weeks.          The [facility] ID service will continue to follow.  Please page the ID Transplant/Liquid Oncology Fellow consult at [phone] with questions.  Patient was discussed with Dr Raylene Miyamoto.    Kandice Moos, MD  Fellow, Division of Infectious Diseases    Interval HPI:      Source of information includes:  Electronic Medical Records.  History obtained from: EMR  , primary team  Interval events since last encounter: extubated , interactive ,Not hypotensive.  HPI: Feels well , intermittent cough   ROS: negative    Medications:   Antimicrobials:  Linezolid 12/3 ->  Daptomycin 12/3 X 1  Oseltamivir 12/2 ->12/7  Aztreonam 12/3 -> 12/6 12/13-->12/14  Inhaled amikacin 12/15-->  Levofloxacin 12/15-->  Azithromycin 12/3 -12/4   Metronidazole 12/3 x 1    Ppx: TMP/SMX, valacyclovir    Current/Prior immunomodulators:  ponatinib 30mg  monotherapy (  held)    Other medications reviewed.        Vital Signs last 24 hours:  Temp:  [36.5 ??C (97.7 ??F)-37.1 ??C (98.8 ??F)] 36.9 ??C (98.4 ??F)  Heart Rate:  [64-95] 76  SpO2 Pulse:  [64-95] 76  Resp:  [15-33] 16  BP: (140-155)/(57-96) 151/96  MAP (mmHg):  [79-111] 111  A BP-2: (105-235)/(63-225) 147/73  MAP:  [82 mmHg-228 mmHg] 96 mmHg  FiO2 (%):  [32 %] 32 %  SpO2:  [85 %-100 %] 94 %    Physical Exam:   Patient Lines/Drains/Airways Status       Active Active Lines, Drains, & Airways       Name Placement date Placement time Site Days    Peripheral IV 03/08/21 Right Antecubital 03/08/21  0102  Antecubital  11    Peripheral IV 03/17/21 Anterior;Left Forearm 03/17/21  1312  Forearm  2                    Const [x]  vital signs above    []  NAD, non-toxic appearance []  Chronically ill-appearing, non-distressed        Eyes [x]  Lids normal bilaterally, conjunctiva anicteric and noninjected OU     [] PERRL  [] EOMI        ENMT [x]  Normal appearance of external nose and ears, no nasal discharge        [x]  MMM, no lesions on lips or gums []  No thrush, leukoplakia, oral lesions  []  Dentition good []  Edentulous []  Dental caries present  []  Hearing normal  []  TMs with good light reflexes bilaterally         Neck [x]  Neck of normal appearance and trachea midline        []  No thyromegaly, nodules, or tenderness   []  Full neck ROM        Lymph []  No LAD in neck     []  No LAD in supraclavicular area     []  No LAD in axillae   []  No LAD in epitrochlear chains     []  No LAD in inguinal areas        CV []  RRR            [x]  No peripheral edema     [x]  Pedal pulses intact   [x]  No abnormal heart sounds appreciated   [x]  Extremities WWP   Regular, borderline tachy      Resp []  Normal WOB at rest    []  No breathlessness with speaking, no coughing  []  CTA anteriorly    []  CTA posteriorly    coarse breath sounds bilaterally      GI [x]  Normal inspection, NTND   [x]  NABS     [x]  No umbilical hernia on exam       []  No hepatosplenomegaly     []  Inspection of perineal and perianal areas normal        GU [x]  Normal external genitalia     [] No urinary catheter present in urethra   []  No CVA tenderness    []  No tenderness over renal allograft  Foley present      MSK [x]  No clubbing or cyanosis of hands       []  No vertebral point tenderness  [x]  No focal tenderness or abnormalities on palpation of joints in RUE, LUE, RLE, or LLE        Skin [x]  No rashes, lesions, or ulcers of visualized skin     [x]  Skin warm  and dry to palpation    L anterior chest pressure ulcer covered      Neuro [x]  Face expression symmetric  []  Sensation to light touch grossly intact throughout    [x]  Moves extremities equally    [x]  No tremor noted        []  CNs II-XII grossly intact     []  DTRs normal and symmetric throughout []  Gait unremarkable        Psych [x]  Appropriate affect       [x]  Fluent speech         []  Attentive, good eye contact  [x]  Oriented to person, place, time          []  Judgment and insight are appropriate           Data for Medical Decision Making     Recent Labs   Lab Units 03/19/21  0531 03/17/21  0338 03/16/21  1518   WBC 10*9/L 13.3*   < >  --    HEMOGLOBIN g/dL 8.3*   < >  --    PLATELET COUNT (1) 10*9/L 308   < >  --    NEUTRO ABS 10*9/L 11.6*   < >  --    LYMPHO ABS 10*9/L 1.0*   < >  --    EOSINO ABS 10*9/L 0.2   < >  --    SODIUM mmol/L 139   < > 146*   POTASSIUM mmol/L 4.1   < > 4.0   BUN mg/dL 15   < > 41*   CREATININE mg/dL 1.61*   < > 0.96*   GLUCOSE mg/dL 045   < > 99   CALCIUM mg/dL 8.3*   < > 8.7   MAGNESIUM mg/dL 1.4*   < > 1.9   PHOSPHORUS mg/dL 2.7   < >  --    BILIRUBIN TOTAL mg/dL 0.5   < >  --    AST U/L 49*   < >  --    ALT U/L 22   < >  --    IGG mg/dL  --   --  409*    < > = values in this interval not displayed.     I reviewed and noted the following labs: leukocytosis 13.3  . Anemia with Hgb 8.3, S Cr elevated 1.44, decreasing. AST/ALT 49/22, IGG 632.    Microbiology:  Past cultures were reviewed in Epic and CareEverywhere.  Microbiology Results (last day)       Procedure Component Value Date/Time Date/Time    Blood Culture #1 [[id number]]  (Normal) Collected: 03/09/21 0458    Lab Status: Final result Specimen: Blood from 1 Peripheral Draw Updated: 03/14/21 0545     Blood Culture, Routine No Growth at 5 days    Blood Culture #2 [[id number]]  (Normal) Collected: 03/09/21 0458    Lab Status: Final result Specimen: Blood from 1 Peripheral Draw Updated: 03/14/21 0545     Blood Culture, Routine No Growth at 5 days    Lower Respiratory Culture [[id number]] Collected: 03/14/21 0254    Lab Status: Preliminary result Specimen: Sputum from Lung, Combined Updated: 03/14/21 0323     Gram Stain <10  Epithelial cells/LPF      >25 PMNS/LPF      1+ Gram positive cocci      Yeast present      Acceptable for culture    Narrative:      Specimen Source: Lung, Combined    Blood Culture,  Adult [[id number]] Collected: 03/13/21 2339    Lab Status: In process Specimen: Blood from 1 Peripheral Draw Updated: 03/13/21 2344    Blood Culture, Adult [[id number]] Collected: 03/13/21 2339    Lab Status: In process Specimen: Blood from 1 Peripheral Draw Updated: 03/13/21 2344          Imaging:  No results found.   XR Chest 12/7  Similar patchy and confluent consolidation in the right lung, greatest in the mid zone. Persistent left lower lung consolidation.   Small right pleural effusion. No pneumothorax.  Stable cardiomediastinal silhouette.      XR Chest 12/5 : Interval increase in right hemithorax consolidation. Persistent retrocardiac consolidation.No pleural effusion or pneumothorax    XR Chest 12/4: Slight increase in nonspecific left basilar opacities. Patchy and confluent heterogeneous opacities in the right upper lung are similar.      XR Chest Portable 03/06/2021  Marked interval worsening of airspace consolidation in the right upper and midlung zones. The left lung is clear.  No pleural effusion or pneumothorax.  Stable cardiomediastinal silhouette.    I have reviewed the images from 03/10/21 and agree with the findings.    Additional Studies:   (12/10) EKG QTc 427

## 2021-03-20 NOTE — Unmapped (Signed)
No acute events overnight. Pt AOx4 and O2sat stable on RA during the day and 2L LBNC while sleeping. Pain well controlled on scheduled and PRN meds. UOP adequate. VSS. Please see flowsheets for vitals/trends. Will continue to monitor.   Problem: Adult Inpatient Plan of Care  Goal: Plan of Care Review  Outcome: Progressing  Goal: Patient-Specific Goal (Individualized)  Outcome: Progressing  Goal: Absence of Hospital-Acquired Illness or Injury  Outcome: Progressing  Intervention: Identify and Manage Fall Risk  Recent Flowsheet Documentation  Taken 03/19/2021 2000 by Josephina Gip, RN  Safety Interventions:   aspiration precautions   bleeding precautions   commode/urinal/bedpan at bedside   family at bedside   fall reduction program maintained   lighting adjusted for tasks/safety   low bed  Intervention: Prevent Skin Injury  Recent Flowsheet Documentation  Taken 03/19/2021 2000 by Josephina Gip, RN  Skin Protection: adhesive use limited  Intervention: Prevent and Manage VTE (Venous Thromboembolism) Risk  Recent Flowsheet Documentation  Taken 03/19/2021 2000 by Josephina Gip, RN  Activity Management: activity adjusted per tolerance  VTE Prevention/Management: anticoagulant therapy  Intervention: Prevent Infection  Recent Flowsheet Documentation  Taken 03/19/2021 2000 by Josephina Gip, RN  Infection Prevention: cohorting utilized  Goal: Optimal Comfort and Wellbeing  Outcome: Progressing  Goal: Readiness for Transition of Care  Outcome: Progressing  Goal: Rounds/Family Conference  Outcome: Progressing     Problem: Infection  Goal: Absence of Infection Signs and Symptoms  Outcome: Progressing  Intervention: Prevent or Manage Infection  Recent Flowsheet Documentation  Taken 03/19/2021 2000 by Josephina Gip, RN  Infection Management: aseptic technique maintained  Isolation Precautions: contact precautions maintained     Problem: Skin Injury Risk Increased  Goal: Skin Health and Integrity  Outcome: Progressing  Intervention: Optimize Skin Protection  Recent Flowsheet Documentation  Taken 03/20/2021 0000 by Josephina Gip, RN  Head of Bed [facility]) Positioning: HOB at 20 degrees  Taken 03/19/2021 2000 by Josephina Gip, RN  Pressure Reduction Techniques: frequent weight shift encouraged  Head of Bed (HOB) Positioning: HOB at 20-30 degrees  Pressure Reduction Devices: pressure-redistributing mattress utilized  Skin Protection: adhesive use limited     Problem: Fluid Imbalance (Pneumonia)  Goal: Fluid Balance  Outcome: Progressing     Problem: Infection (Pneumonia)  Goal: Resolution of Infection Signs and Symptoms  Outcome: Progressing  Intervention: Prevent Infection Progression  Recent Flowsheet Documentation  Taken 03/19/2021 2000 by Josephina Gip, RN  Infection Management: aseptic technique maintained  Isolation Precautions: contact precautions maintained     Problem: Respiratory Compromise (Pneumonia)  Goal: Effective Oxygenation and Ventilation  Outcome: Progressing  Intervention: Optimize Oxygenation and Ventilation  Recent Flowsheet Documentation  Taken 03/20/2021 0000 by Josephina Gip, RN  Head of Bed [facility]) Positioning: HOB at 20 degrees  Taken 03/19/2021 2000 by Josephina Gip, RN  Head of Bed [facility]) Positioning: HOB at 20-30 degrees     Problem: Pain Chronic (Persistent) (Comorbidity Management)  Goal: Acceptable Pain Control and Functional Ability  Outcome: Progressing     Problem: Self-Care Deficit  Goal: Improved Ability to Complete Activities of Daily Living  Outcome: Progressing     Problem: Communication Impairment (Mechanical Ventilation, Invasive)  Goal: Effective Communication  Outcome: Progressing     Problem: Device-Related Complication Risk (Mechanical Ventilation, Invasive)  Goal: Optimal Device Function  Outcome: Progressing  Intervention: Optimize Device Care and Function  Recent Flowsheet Documentation  Taken 03/19/2021 2000 by Josephina Gip, RN  Aspiration Precautions:  awake/alert before oral intake   upright posture maintained   oral hygiene care promoted     Problem: Inability to Wean (Mechanical Ventilation, Invasive)  Goal: Mechanical Ventilation Liberation  Outcome: Progressing     Problem: Nutrition Impairment (Mechanical Ventilation, Invasive)  Goal: Optimal Nutrition Delivery  Outcome: Progressing     Problem: Skin and Tissue Injury (Mechanical Ventilation, Invasive)  Goal: Absence of Device-Related Skin and Tissue Injury  Outcome: Progressing  Intervention: Maintain Skin and Tissue Health  Recent Flowsheet Documentation  Taken 03/19/2021 2000 by Josephina Gip, RN  Device Skin Pressure Protection: absorbent pad utilized/changed     Problem: Ventilator-Induced Lung Injury (Mechanical Ventilation, Invasive)  Goal: Absence of Ventilator-Induced Lung Injury  Outcome: Progressing  Intervention: Prevent Ventilator-Associated Pneumonia  Recent Flowsheet Documentation  Taken 03/20/2021 0000 by Josephina Gip, RN  Head of Bed [facility]) Positioning: HOB at 20 degrees  Taken 03/19/2021 2000 by Josephina Gip, RN  Head of Bed [facility]) Positioning: HOB at 20-30 degrees  Oral Care: patient refused intervention     Problem: Impaired Wound Healing  Goal: Optimal Wound Healing  Outcome: Progressing  Intervention: Promote Wound Healing  Recent Flowsheet Documentation  Taken 03/19/2021 2000 by Josephina Gip, RN  Activity Management: activity adjusted per tolerance     Problem: Fall Injury Risk  Goal: Absence of Fall and Fall-Related Injury  Outcome: Progressing  Intervention: Promote Injury-Free Environment  Recent Flowsheet Documentation  Taken 03/19/2021 2000 by Josephina Gip, RN  Safety Interventions:   aspiration precautions   bleeding precautions   commode/urinal/bedpan at bedside   family at bedside   fall reduction program maintained   lighting adjusted for tasks/safety   low bed

## 2021-03-20 NOTE — Unmapped (Signed)
Physician Discharge Summary [facility]  MICU [facility]  [address]  [place] [address]  Dept: [phone]  Loc: [phone]     Identifying Information:   Terik Haughey  09/10/1980  [id number]    Primary Care Physician: [name], MD     Code Status: Full Code    Admit Date: 03/06/2021    Discharge Date: 03/20/2021     Discharge To: Home    Discharge Service: [facility] - Medicine ICU Team A (MICU-A)     Discharge Attending Physician: [name], MD    Discharge Diagnoses:   Principal Problem:    Acute respiratory failure with hypoxia (CMS-HCC) POA: Yes  Active Problems:    AKI (acute kidney injury) (CMS-HCC) POA: Yes    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 [facility] Course:     Items for outpatient follow up:    [ ]  Patient will need BMP early next week to monitor resolution of AKI but he appears at baseline Cr  [ ]  Patient should take Linezolid for 4-6 weeks total. He will follow-up with Infectious Disease for final duration.  [ ]  Patient should take 1 additional day of Levaquin for a total 5d course  [ ]  Patient instructed to restart Ponatinib per Oncology on discharge as well as home ppx.   [ ]  Held home nortriptyline, can consider restarting outpatient if needed   [ ]  Experiencing constipation during admission, follow-up to ensure good bowel regimen    Jayten Gabbard is a 40 y.o. 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 He greatly improved with antibiotics and was discharged on room air. Hospital course by problem below.    Acute hypoxic respiratory failure 2/2 influenza and MRSA/Klebsiella pneumonia  Presented with several days of malaise, shortness of breath, coughing.  Was hypoxic requiring high flow nasal cannula, and unfortunately hypoxia progressed to requiring intubation, mechanical ventilation, and proning.  CT chest obtained showed worsening multifocal consolidative airspace, with potential empyema given trace loculated collections with lateral aspect of the right chest. Given these finding patients underwent bronscopy with BAL on 12/12 (with micro labs order below). Cell count showed 97% neutrophils. LRC from 12/11 grew klebsiella and MRSA. [facility] from Bronchoscopy positive for MRSA, discussed with ID will take time for clearance from system. TTE negative for vegetations. He received inhaled amikacin (12/15-12/17) and oral levofloxacin 750 mg q24h (12/15-12/18) per infectious disease. He also continued PO Linezolid which he will continue to 4-6 weeks total per follow-up with infectious disease.     Deconditioning: Patient qualified for AIR however declined PT services  at this time. Per PT he did not desaturate when walking and he has oxygen at home. He has DME supplies at home including a walker and preferred to leave today.     AKI, resolved  Likely prerenal vs ATN. Cr at likely baseline at discharge (1.33).    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 Continued home Bactrium (normally BID Sat-Sun)    Chronic loculated effusion:  Had prior attempts at thoracentesis which were unsuccessful in the outpatient setting.  He had seen thoracic surgery in the outpatient setting as well who recommended against surgical exploration.    # Chronic Medical Problems  Chronic pain:  MDD and anxiety: Hold nortriptyline. Continue clonazepam 0.5mg  BID  COPD: Continued home Anoro Ellipta on discharge  AKI: Likely prerenal in nature 2/2 poor oral intake and sepsis.  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  Avoided all beta-lactam's as possible while he was inpatient    The patient's hospital stay has been complicated by the following clinically significant conditions requiring additional evaluation and treatment or having a significant effect of this patient's care: - Malnutrition POA requiring further investigation, treatment, or monitoring  - Anemia POA requiring further investigation or monitoring  - Chronic kidney disease POA requiring further investigation, treatment, or monitoring     Nutrition Assessment: Patient does not meet AND/ASPEN criteria for malnutrition at this time (03/08/21 1502)       Touchbase with Outpatient Provider:  Warm Handoff: Completed on 03/20/21 by Theodis Shove  (Resident) via Phone with infectious disease and oncology.    Procedures:  None  ______________________________________________________________________  Discharge Medications:      Your Medication List      STOP taking these medications    nortriptyline 50 MG capsule  Commonly known as: PAMELOR     oseltamivir 75 MG capsule  Commonly known as: TAMIFLU        START taking these medications    levoFLOXacin 750 MG tablet  Commonly known as: LEVAQUIN  Take 1 tablet (750 mg total) by mouth daily.  Start taking on: March 21, 2021        CHANGE how you take these medications    linezolid 600 mg tablet  Commonly known as: ZYVOX  Take 1 tablet (600 mg total) by mouth every twelve (12) hours for 28 days. Continue this medication until instructed to stop by your infectious disease team  What changed:   ?? when to take this  ?? additional instructions     OxyCONTIN 15 mg 12 hr crush resistant ER/CR tablet  Generic drug: oxyCODONE  Take 1 tablet (15 mg total) by mouth Two (2) times a day.  What changed: Another medication with the same name was removed. Continue taking this medication, and follow the directions you see here.        CONTINUE taking these medications    acetaminophen 325 MG tablet  Commonly known as: TYLENOL  Take 650 mg by mouth every six (6) hours as needed for pain.     albuterol 90 mcg/actuation inhaler  Commonly known as: PROVENTIL HFA;VENTOLIN HFA  Inhale 2 puffs every six (6) hours as needed for wheezing.  amLODIPine 10 MG tablet  Commonly known as: NORVASC  Take 1 tablet (10 mg total) by mouth daily.     aspirin 81 MG chewable tablet  Chew 1 tablet (81 mg total) daily.     carvediloL 12.5 MG tablet  Commonly known as: COREG  Take 1 tablet (12.5 mg total) by mouth Two (2) times a day.     clonazePAM 0.5 MG tablet  Commonly known as: KlonoPIN  Take 1 tablet (0.5 mg total) by mouth two (2) times a day as needed for anxiety or sleep.     gabapentin 300 MG capsule  Commonly known as: NEURONTIN  Take 1 capsule (300 mg total) by mouth Three (3) times a day.     HYDROmorphone 2 MG tablet  Commonly known as: DILAUDID  Take 1 tablet (2 mg total) by mouth every six (6) hours as needed for pain,moderate (4-6).     ICLUSIG 30 mg tablet  Generic drug: PONATinib  Take 1 tablet (30 mg total) by mouth daily. Swallow tablets whole. Do not crush, break, cut or chew tablets.     sulfamethoxazole-trimethoprim 800-160 mg per tablet  Commonly known as: BACTRIM DS  Take 1 tablet (160 mg of trimethoprim total) by mouth 2 times a day on Saturday, Sunday. For prophylaxis while on chemo.     umeclidinium-vilanteroL 62.5-25 mcg/actuation inhaler  Commonly known as: ANORO ELLIPTA  Inhale 1 puff daily.     valACYclovir 500 MG tablet  Commonly known as: VALTREX  Take 1 tablet (500 mg total) by mouth daily.            Allergies:  Bupropion hcl, Cefepime, Ceftaroline fosamil, Dapsone, Onion, Vancomycin analogues, Bismuth subsalicylate, and Furosemide  ______________________________________________________________________  Pending Test Results:  Pending Labs     Order Current Status    AFB culture In process    Fungal Culture Preliminary result    Fungal Culture Preliminary result          Most Recent Labs:  All lab results last 24 hours -   Recent Results (from the past 24 hour(s))   Phosphorus Level    Collection Time: 03/20/21  7:20 AM   Result Value Ref Range    Phosphorus 3.5 2.4 - 5.1 mg/dL   Hepatic Function Panel    Collection Time: 03/20/21  7:20 AM   Result Value Ref Range    Albumin 2.1 (L) 3.4 - 5.0 g/dL    Total Protein 5.7 5.7 - 8.2 g/dL    Total Bilirubin 0.4 0.3 - 1.2 mg/dL    Bilirubin, Direct 1.61 0.00 - 0.30 mg/dL    AST 49 (H) <=09 U/L    ALT 30 10 - 49 U/L    Alkaline Phosphatase 225 (H) 46 - 116 U/L   Basic Metabolic Panel    Collection Time: 03/20/21  7:20 AM   Result Value Ref Range    Sodium 141 135 - 145 mmol/L    Potassium 3.8 3.4 - 4.8 mmol/L    Chloride 107 98 - 107 mmol/L    CO2 22.0 20.0 - 31.0 mmol/L    Anion Gap 12 5 - 14 mmol/L    BUN 16 9 - 23 mg/dL    Creatinine 6.04 (H) 0.60 - 1.10 mg/dL    BUN/Creatinine Ratio 12     eGFR CKD-EPI (2021) Male 69 >=60 mL/min/1.26m2    Glucose 99 70 - 179 mg/dL    Calcium 8.2 (L) 8.7 - 10.4 mg/dL   Magnesium Level  Collection Time: 03/20/21  7:20 AM   Result Value Ref Range    Magnesium 1.6 1.6 - 2.6 mg/dL   CBC w/ Differential    Collection Time: 03/20/21  7:20 AM   Result Value Ref Range    WBC 12.2 (H) 3.6 - 11.2 10*9/L    RBC 3.15 (L) 4.26 - 5.60 10*12/L    HGB 8.7 (L) 12.9 - 16.5 g/dL    HCT 29.5 (L) 62.1 - 48.0 %    MCV 86.2 77.6 - 95.7 fL    MCH 27.7 25.9 - 32.4 pg    MCHC 32.2 32.0 - 36.0 g/dL    RDW 30.8 (H) 65.7 - 15.2 %    MPV 9.1 6.8 - 10.7 fL    Platelet 343 150 - 450 10*9/L    Neutrophils % 83.0 %    Lymphocytes % 9.6 %    Monocytes % 4.2 %    Eosinophils % 1.5 %    Basophils % 1.7 %    Absolute Neutrophils 10.1 (H) 1.8 - 7.8 10*9/L    Absolute Lymphocytes 1.2 1.1 - 3.6 10*9/L    Absolute Monocytes 0.5 0.3 - 0.8 10*9/L    Absolute Eosinophils 0.2 0.0 - 0.5 10*9/L    Absolute Basophils 0.2 (H) 0.0 - 0.1 10*9/L    Anisocytosis Slight (A) Not Present       Relevant Studies/Radiology:  ECG 12 Lead    Result Date: 03/14/2021  SINUS TACHYCARDIA POSSIBLE LEFT ATRIAL ENLARGEMENT BORDERLINE ECG WHEN COMPARED WITH ECG OF 06-Mar-2021 13:39, NO SIGNIFICANT CHANGE WAS FOUND Confirmed by Vickey Huger [phone]) on 03/14/2021 9:54:35 PM    ECG 12 Lead    Result Date: 03/07/2021  SINUS TACHYCARDIA RIGHTWARD AXIS BORDERLINE ECG WHEN COMPARED WITH ECG OF 01-Nov-2020 05:16, NO SIGNIFICANT CHANGE WAS FOUND Confirmed by Mariane Baumgarten ([id number]) on 03/07/2021 7:30:02 AM    CT Abdomen Pelvis Wo Contrast    Result Date: 03/16/2021  EXAM: CT ABDOMEN PELVIS WO CONTRAST DATE: 03/15/2021 5:24 PM ACCESSION: [id number] UN DICTATED: 03/15/2021 5:42 PM INTERPRETATION LOCATION: [facility] CLINICAL INDICATION: 40 year old with Infectious concern  COMPARISON: Abdominal CT from 02/13/2020 TECHNIQUE: A spiral CT scan was obtained without IV contrast from the lung bases to the pubic symphysis.  Images were reconstructed in the axial plane. Coronal and sagittal reformatted images were also provided for further evaluation. Evaluation of the solid organs and vasculature is limited in the absence of intravenous contrast. FINDINGS: LOWER CHEST: Please see same day chest CT for findings above the diaphragm. LIVER: Normal liver contour. No focal liver lesion on non-contrast examination. BILIARY: No intrahepatic biliary ductal dilatation.  The gallbladder is normal in appearance SPLEEN: Splenomegaly measuring up to 14.5 cm in craniocaudal dimension. Normal splenic contour. PANCREAS: Normal pancreatic contour without signs of inflammation or gross ductal dilatation. ADRENAL GLANDS: Normal appearance of the adrenal glands. KIDNEYS/URETERS: Smooth renal contours.  No nephrolithiasis.  No ureteral dilatation or collecting system distention. Mild bilateral perinephric soft tissue stranding, likely physiologic. BLADDER: Decompressed with Foley catheter in place. Small-volume gas within the bladder, secondary to instrumentation. REPRODUCTIVE ORGANS: Unremarkable. GI TRACT: Esophagogastric tube with tip in the gastric body. No focal gastric wall thickening. Duodenum is normal in course and caliber. No evidence of bowel obstruction. Mildly dilated colon with associated gaseous distention. No focal transition point. Mild circumferential wall thickening involving the distal sigmoid colon/rectum with adjacent soft tissue stranding, for reference (3:63). Normal appendix. PERITONEUM/RETROPERITONEUM AND MESENTERY: No free air. No fluid collection. VASCULATURE:  Normal caliber aorta. [space for atherosclerosis] Otherwise, limited evaluation without contrast. LYMPH NODES: No adenopathy. BONES and SOFT TISSUES: No aggressive osseous lesions. Small bilateral fat-containing hernias. Postsurgical changes of the anterior abdominal wall. Multifocal soft tissue thickening in the superficial soft tissues of the anterior abdominal wall in the right lower quadrant (1:125), likely sequelae of injection.     --Mild circumferential wall thickening with adjacent soft tissue stranding involving the distal sigmoid colon/rectum. Findings are nonspecific although can be seen in the setting of early colitis/proctitis. --Additional chronic and incidental findings as detailed above. --Please see same day chest CT for findings above the diaphragm. ==================== ADDENDUM (03/16/2021 4:55 AM): On review, the following additional findings were noted: Agree with preliminary report above. In addition to findings suggestive of colitis/proctitis, there is notably liquid stool with air-fluid levels throughout the colon. This can be seen in diarrheal illness.    XR Chest Portable    Result Date: 03/17/2021  EXAM: XR CHEST PORTABLE DATE: 03/17/2021 10:47 AM ACCESSION: [id number] UN DICTATED: 03/17/2021 11:09 AM INTERPRETATION LOCATION: [facility] CLINICAL INDICATION: 41 years old Male with HYPOXEMIA  TECHNIQUE: Portable Chest Radiograph. COMPARISON: 03/10/2021 and priors. FINDINGS: MEDICAL DEVICES: Interval right IJ central venous catheter has been removed. Endotracheal tube and NG/OG tube unchanged. LUNGS AND PLEURA: Similar right midlung airspace consolidation. Left lung is clear. No pleural effusion. No pneumothorax. HEART AND MEDIASTINUM: Heart is normal in size. Normal thoracic aorta. BONES AND SOFT TISSUES: No acute  osseous abnormalities.     *Stable right midlung airspace consolidation consistent with pneumonia since prior, improving since December 5.    XR Chest Portable    Result Date: 03/10/2021  EXAM: XR CHEST PORTABLE DATE: 03/10/2021 1:26 PM ACCESSION: [id number] UN DICTATED: 03/10/2021 1:28 PM INTERPRETATION LOCATION: [facility] CLINICAL INDICATION: 40 years old Male with ETT (VENTILATOR/RESPIRATOR DEP STATUS)  COMPARISON: Prior day portable chest radiograph TECHNIQUE: Portable Chest Radiograph. FINDINGS: Unchanged support devices. Similar patchy and confluent consolidation in the right lung, greatest in the mid zone. Persistent left lower lung consolidation. Small right pleural effusion. No pneumothorax. Stable cardiomediastinal silhouette.     No significant interval change with persistent right lung airspace disease, densest in the mid zone, and left lower lung atelectasis/airspace disease.    XR Chest Portable    Result Date: 03/08/2021  EXAM: XR CHEST PORTABLE DATE: 03/07/2021 10:38 PM ACCESSION: [id number] UN DICTATED: 03/08/2021 8:22 AM INTERPRETATION LOCATION: [facility] CLINICAL INDICATION: 40 years old Male with ETT (VENTILATOR/RESPIRATOR DEP STATUS)  COMPARISON: 03/07/2021 TECHNIQUE: Single frontal view of the chest. FINDINGS: Placement of endotracheal tube with tip in 4.3 cm above carina. NG/OG tube courses below left hemidiaphragm with tip out of field of view. Similar air space consolidation in the right upper-mid lung with volume loss. Stable small left pleural effusion and basilar atelectasis. Normal cardiac silhouette unchanged. No pneumothorax.     Stable airspace disease in the right upper-mid lung.     XR Chest Portable    Result Date: 03/08/2021  EXAM: XR CHEST PORTABLE DATE: 03/08/2021 2:03 AM ACCESSION: [id number] UN DICTATED: 03/08/2021 2:48 AM INTERPRETATION LOCATION: [facility] CLINICAL INDICATION: 40 years old Male with CATHETER  NON VASCULAR FIT&ADJ  COMPARISON: Chest radiograph 03/07/2021 TECHNIQUE: Portable Chest Radiograph. FINDINGS: Right IJ approach central venous catheter overlies the distal SVC. Remaining lines and tubes are unchanged. Interval increase in right hemithorax consolidation. Persistent retrocardiac consolidation. No pleural effusion or pneumothorax. Stable cardiomediastinal silhouette.     -Support devices above. -Interval worsening of right hemithorax aeration.  XR Chest Portable    Result Date: 03/07/2021  EXAM: XR CHEST PORTABLE DATE: 03/07/2021 5:19 AM ACCESSION: [id number] UN DICTATED: 03/07/2021 9:48 AM INTERPRETATION LOCATION: [place] [facility] CLINICAL INDICATION: 40 years old Male with PNEUMONIA  COMPARISON: Single view of the chest 03/06/2021 TECHNIQUE:  Single AP portable view of the chest FINDINGS: No significant change in appearance of the cardiomediastinal silhouette. No appreciable pneumothorax. Probable trace right pleural effusion. Patchy and confluent heterogeneous right upper lung opacities are similar to prior exam. Additional scattered opacities at the lung bases, slightly increased on the left from prior.     Slight increase in nonspecific left basilar opacities. Patchy and confluent heterogeneous opacities in the right upper lung are similar.      XR Chest Portable    Result Date: 03/06/2021  EXAM: XR CHEST PORTABLE DATE: 03/06/2021 10:06 AM ACCESSION: [id number] UN DICTATED: 03/06/2021 10:23 AM INTERPRETATION LOCATION: [facility] CLINICAL INDICATION: 40 years old Male with PNEUMONIA  COMPARISON: 03/05/2021 TECHNIQUE: Portable Chest Radiograph. FINDINGS: Marked interval worsening of airspace consolidation in the right upper and midlung zones. The left lung is clear. No pleural effusion or pneumothorax. Stable cardiomediastinal silhouette.     1. Marked interval worsening of right lung consolidation.    XR Chest 1 view    Result Date: 03/09/2021  EXAM: XR CHEST 1 VIEW DATE: 03/09/2021 7:52 PM ACCESSION: [id number] UN DICTATED: 03/09/2021 8:54 PM INTERPRETATION LOCATION: [facility] CLINICAL INDICATION: 40 years old Male with ETT (VENTILATOR/RESPIRATOR DEP STATUS)  COMPARISON: Prior day chest radiograph TECHNIQUE: Portable Chest Radiograph. FINDINGS: Unchanged lines and tubes. Interval improvement in heterogeneous airspace opacities bilaterally with residual patchy consolidation in right upper lobe and midlung.  Trace right pleural effusion. No pneumothorax. Stable cardiomediastinal silhouette.     Interval improvement in previously reported bilateral heterogeneous airspace opacities with residual patchy consolidation in right upper lobe and midlung. Trace right pleural effusion.    CT Chest Wo Contrast    Result Date: 03/16/2021  EXAM: CT CHEST WO CONTRAST DATE: 03/15/2021 5:24 PM ACCESSION: [id number] UN DICTATED: 03/15/2021 5:32 PM INTERPRETATION LOCATION: [facility] CLINICAL INDICATION: 40 years old Male with For management of infection, c/f empyema  COMPARISON: Concurrent CT abdomen and pelvis. CTA chest 12/28/2020. TECHNIQUE: Contiguous 0.6 or 1 mm and 2 mm axial images were reconstructed through the chest following a single breath hold helical acquisition.  Images were reformatted in the axial and sagittal planes.  MIP slabs were also constructed. FINDINGS: LUNGS AND AIRWAYS: Moderate upper lobe predominant centrilobular emphysema. New consolidation with air bronchograms in the right upper lobe (2:37), and left lower lobe (2:74). Upper lobe predominant smooth interlobular septal thickening. There is additional nodularity and groundglass, for reference right lower lobe (2:73).  Endotracheal tube terminates in the thoracic trachea, above the carina. Small volume debris adjacent to the tip of the endotracheal tube. Cardiac bronchus (2:56). PLEURA: Small left pleural effusion without pleural thickening. No pneumothorax. MEDIASTINUM AND LYMPH NODES: Multiple mildly enlarged mediastinal lymph nodes, for reference 1.0 cm right paratracheal node (2:38). No enlarged axillary lymph nodes. Enteric tube courses below the diaphragm with tip terminating at the level of the gastric body. No other mediastinal abnormality. HEART AND VASCULATURE:The cardiac chambers are normal in size. Extensive left ventricular myocardial calcifications, unchanged. Trace pericardial effusion.  Ascending and descending aorta normal in caliber.  Pulmonary artery normal in size. BONES AND SOFT TISSUES: Unremarkable. UPPER ABDOMEN: Unremarkable.     1. Multifocal consolidative airspace disease is consistent with the concern for infectious pneumonia. 2. With regards to  a potential empyema, the only notable findings include a nonspecific small pleural effusion in the dependent aspect of the left pleural space and trace loculated collections within the lateral aspect of the right chest (images 63 and 73, series 2). 3. Extensive calcification of the left ventricular myocardium is again evident.     ED POCUS Chest Bilateral    Result Date: 03/06/2021  Limited Cardiac Ultrasound (CPT [phone]) Indication: A focused ultrasound exam of the heart was performed to evaluate for pericardial effusion, tamponade, severe hypovolemia, or gross abnormalities of cardiac anatomy or function in this patient. The ultrasound was performed with the following indications, as noted in the H&P: Hypotension Identified structures: The pericardial sac, myocardium, and 4 chambers were identified using the following views: subxiphoid, parasternal long axis, parasternal short axis, apical 4-chamber, and IVC (long axis) Findings: Exam of the above structures revealed the following findings:  Pericardial effusion: Absent   Pericardial tamponade: N/A  Global LV function: Hyperdynamic  Right ventricular size: Normal   Signs of RV strain: N/A  IVC: Collapsed:  >50%  Other findings: None. Limitations: None. Impression: No sonographic evidence of significant cardiac dysfunction, No sonographic evidence of significant pericardial effusion, Normal RV, and Sonographic findings suggestive of volume depletion Other: None. Interpreted by: Ardeth Perfect, MD Quality Assurance  After review of the point-of-care ultrasound performed in this case I assess the overall image quality as: Image quality: Minimal criteria met for diagnosis, all structures imaged well and diagnosis easily supported  The accuracy of interpretation of images as presented reflects a true positive. This study does  meet minimum criteria for credentialing and billing. Luther Redo, MD     Echocardiogram Follow Up/Limited Echo With Contrast    Result Date: 03/15/2021  Patient Info Name:     JOQUAN DAVID Dinger Age:     40 years DOB:     07-31-1980 Gender:     Male MRN:     [id number] Accession #:     [id number] UN Ht:     183 cm Wt:     110 kg BSA:     2.39 m2 BP:     139 /     71 mmHg Technical Quality:     Poor Exam Date:     03/15/2021 8:13 AM Site Location:     [facility] Exam Location:     [facility] Admit Date:     03/06/2021 Exam Type:     ECHOCARDIOGRAM FOLLOW UP/LIMITED ECHO WITH CONTRAST Study Info Indications      - r/u endocarditis Limited 2D and color flow transthoracic echocardiogram is performed with contrast to opacify the left ventricle and to improve the delineation of the left ventricle endocardial borders. Staff Referring Physician:     [id number], Jerrol Banana; Reading Fellow:     Merrily Pew MD Sonographer:     Earnest Conroy RDCS, RVT Ordering Physician:     Bernita Buffy Account #:     [id number] Ultrasound Enhancing Agent/Agitated Saline ------------------------------ UEA/Ag. Saline:     [place] Amount:     4.00 ml Administered By:     Earnest Conroy RDCS,  RVT Existing IV Access:     Yes IV Access Condition:     patent with no signs of infiltration Reason for Poor Study:     poor echocardiographic windows Summary   1. Technically difficult study.   2. The left ventricle is normal in size with normal wall thickness.   3. The left ventricular systolic function is normal,  LVEF is visually estimated at > 55%.   4. The right ventricle is not well visualized but probably normal in size.   5. No apparent valvular or endocardial vegetation, however there is suboptimal valvular visualization. Recommendations   * If there is ongoing clinical concern for endocarditis, transesophageal imaging can be considered for further evaluation. Left Ventricle   The left ventricle is normal in size with normal wall thickness.   The left ventricular systolic function is normal, LVEF is visually estimated at > 55%.   There is normal left ventricular diastolic function. Right Ventricle   The right ventricle is not well visualized but probably normal in size. Left Atrium   The left atrium is not well visualized but probably normal in size. Right Atrium   The right atrium is not well visualized but probably normal  in size. Aortic Valve   The aortic valve is not well visualized.   There is no significant aortic regurgitation. Pulmonic Valve   Pulmonary valve is not well visualized. Mitral Valve   The mitral valve is not well visualized.   The mitral valve leaflets are normal with normal leaflet mobility.   There is no significant mitral valve regurgitation. Tricuspid Valve   The tricuspid valve is not well visualized. Pericardium/Pleural   There is no pericardial effusion. Mitral Valve ---------------------------------------------------------------------- Name                                 Value        Normal ---------------------------------------------------------------------- MV Diastolic Function ---------------------------------------------------------------------- MV E Peak Velocity                 65 cm/s               MV A Peak Velocity                 53 cm/s               MV E/A                                 1.2               MV Annular TDI ---------------------------------------------------------------------- MV Septal e' Velocity            15.1 cm/s         >=8.0 MV Lateral e' Velocity            7.3 cm/s        >=10.0 MV e' Average                         11.2               MV E/e' (Average)                      6.6 Ventricles ---------------------------------------------------------------------- Name                                 Value        Normal ---------------------------------------------------------------------- LV Dimensions 2D/MM ---------------------------------------------------------------------- IVS Diastolic Thickness (2D)        0.8 cm       0.6-1.0 LVID Diastole (2D)  4.8 cm       4.2-5.8 LVPW Diastolic Thickness (2D)                                0.9 cm       0.6-1.0 LVID Systole (2D)                   3.1 cm       2.5-4.0 Report Signatures Finalized by Yaakov Guthrie  MD on 03/15/2021 01:44 PM Resident [name]  MD on 03/15/2021 11:07 AM    XR Abdomen 1 View    Result Date: 03/17/2021  EXAM: XR ABDOMEN 1 VIEW DATE: 03/17/2021 1:20 PM ACCESSION: [id number] UN DICTATED: 03/17/2021 1:39 PM INTERPRETATION LOCATION: [facility] CLINICAL INDICATION: 40 years old Male with NGT (CATHETER VASCULAR FIT & ADJ)  TECHNIQUE: Supine view of the abdomen was obtained.  COMPARISON: 03/15/2021 CT abdomen and pelvis. FINDINGS: Nonweighted esophagogastric tube with tip and side-port overlying the left upper quadrant. Weighted esophagogastric tube with tip overlying the left upper quadrant. Gas is seen in nondilated large and small bowel. There is no evidence of obstruction. No abnormal soft tissue masses or calcifications are noted. No acute osseous abnormality is identified. Visualized lung bases are better evaluated on earlier same day chest radiograph.     -- Weighted and nonweighted esophagogastric tubes with tips overlying the gastric body.    XR Abdomen 1 View    Result Date: 03/08/2021  EXAM: XR ABDOMEN 1 VIEW DATE: 03/07/2021 10:38 PM ACCESSION: [id number] UN DICTATED: 03/08/2021 8:22 AM INTERPRETATION LOCATION: [facility] CLINICAL INDICATION: 40 years old Male with NGT (CATHETER VASCULAR FIT & ADJ)  COMPARISON: Concurrent chest radiograph and 11/01/2020 abdominal radiograph. TECHNIQUE: Semiupright view of the abdomen. FINDINGS: Nonweighted esophagogastric tube with tip and side port overlying the left upper quadrant. Nonobstructive bowel gas pattern. Osseous structures and soft tissues unremarkable.     Esophagogastric tube with tip and side port overlying the gastric body.    ______________________________________________________________________  Discharge Instructions:   Activity Instructions     Activity as tolerated                         Appointments which have been scheduled for you    Mar 25, 2021 10:00 AM  (Arrive by 9:30 AM)  RETURN VIDEO MYCHART with Everlena Cooper, MD  [facility] HEMATOLOGY ONCOLOGY 2ND FLR [facility] H[facility]P [facility]) [address]  [place] [address]  [phone]   Please sign into My [place] Chart at least 15 minutes before your appointment to complete the eCheck-In process. You must complete eCheck-In before you can start your video visit. We also recommend testing your audio and video connection to troubleshoot any issues before your visit begins. Click ???Join Video Visit??? to complete these checks. Once you have completed eCheck-In and tested your audio and video, click ???Join Call??? to connect to your visit.     For your video visit, you will need a computer with a working camera, speaker and microphone, a smartphone, or a tablet with internet access.    My [place] Chart enables you to manage your health, send non-urgent messages to your provider, view your test results, schedule and manage appointments, and request prescription refills securely and conveniently from your computer or mobile device.    You can go to [email or web address] to sign in to your My [facility] Chart  account with your username and password. If you have forgotten your username or password, please choose the ???Forgot Username???? and/or ???Forgot Password???? links to gain access. You also can access your My [place] Chart account with the free MyChart mobile app for Android or iPhone.    If you need assistance accessing your My  Chart account or for assistance in reaching your provider's office to reschedule or cancel your appointment, please call [facility] [phone].            ______________________________________________________________________  Discharge Day Services:  BP 136/72  - Pulse 77  - Temp 36.9 ??C (98.4 ??F) (Axillary)  - Resp 28  - Ht 185 cm (6' 0.84)  - Wt (!) 110.2 kg (242 lb 15.2 oz)  - SpO2 97%  - BMI 32.20 kg/m??     Pt seen on the day of discharge and determined appropriate for discharge.    Condition at Discharge: fair    Length of Discharge: I spent greater than 30 mins in the discharge of this patient.

## 2021-03-20 NOTE — Unmapped (Cosign Needed)
Malignant Hematology Consult Note    Requesting Attending Physician :  [name], MD  Service Requesting Consult : Medical ICU (MDI)  Reason for Consult: B-ALL  Primary Oncologist: Dr Senaida Ores    Assessment: Adam Keith is a 40 y.o. 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 Malignant hematology was consulted for evaluation of whether to continue TKI for B-ALL.     #Ph(+) B-ALL  Oncology history:  S/p induction per GRAAPH-2005. Course complicated by septic shock, candida krusei fungemia, MRSA bacteremia with septic emboli c/b acute renal failure and respiratory distress requiring dialysis and intubation. ??Post-induction bmbx (day 29) demonstrated flow-based MRD-negative remission but persistent low-level BCR-ABL. ??Subsequently had difficulty tolerating single agent dasatinib (as a bridge to planned ponatinib-blinatumomab--had fluid retention and pleural effusion). ??He??then completed????4 cycles??of ponatinib-blinatumomab, initiated for treatment of MRD in the setting of poor tolerance of standard therapy.??He is now MRD-negative by flow with BCR-ABL level of 0.002% on marrow by PCR (01/11/21). Patient is now on ponatinib 30mg  monotherapy (since 01/2021).  Plan:  - Resume ponatinib  - Will need non-urgent bone marrow biopsy in the coming weeks to assess adequacy of this dose.     #Acute hypoxemic respiratory failure 2/2 MRSA & Klebsiella oxytoca pneumonia - Influenza A - Hypogammaglobulinemia   Underwent bronch + BAL on 03/16/21 with the above findings. Intubated 03/07/21-03/17/21, currently on HFNC.   - On linezolid per ICID (12/3-) & Amikacin + Levofloxacin (12/14-).  S/p oseltamivir (12/3-12/7)   - s/p IVIG (12/7)   - Bactrim and valtrex prophylaxis    Recommendations:   - Appreciate excellent care of MICU and consultation from ICID.  - Resume ponatinib    This patient has been staffed with Dr. Barbette Merino. These recommendations were discussed with the primary team.     Please contact the malignant hematology fellow at [phone] with any further questions.    Napoleon Form, MD  Hematology-Oncology Fellow    -------------------------------------------------------------    HPI: Adam Keith is a 40 y.o. 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 Malignant hematology was consulted for evaluation of whether to continue TKI for B-ALL.     Seen in ID clinic 12/2 with fever/cough, positive for flu A, CXR concerning for pneumonia, sent home on linezolid. Re-presented to ED on 12/3 with worsening symptoms, including hypotension, hypoxia. WBC 20.6. Admitted to MICU. Overnight had improvement in lactate with IVFs (normalized, initially 4.1 on admission).  Febrile to 38.6 overnight.  Early this morning ~5am, had desaturation to 80% and required 100% FiO2 briefly, was able to be weaned to 75% FiO2 when our team saw him in MICU ~9:30 am.  6AM ABG 7.36/pO2 96/PCO2 35.    Conversant on HFNC this morning. Overall feels well.     Review of Systems: All positive and pertinent negatives are noted in the HPI; otherwise all other systems are negative    Oncologic History:  Oncology History Overview Note   Referring/Local Oncologist: None    Diagnosis:Ph+ ALL    Genetics:    Karyotype/FISH:Abnormal Karyotype: 46,XY,t(9;22)(q34;q11.2)[1]/45,XY,der(7;9)(q10;q10)t(9;22)(q34;q11.2),der(22)t(9;22)[11]/46,sdl,+der(22)t(9;22)[5]/46,XY[3]     Abnormal FISH: A BCR/ABL1 interphase FISH assay shows an abnormal signal pattern in 97% of the 100 cells scored. Of note, 2/97 abnormal  cells have an additional BCR/ABL1 fusion signal from the der(22) chromosome, consistent with the additional copy of the der(22) seen in clone 3 by G-banding.  The findings support a diagnosis of leukemia and have implications for targeted therapy and for monitoring residual disease.      Molecular Genetics:  BCR-ABL1 p210 transcripts were detected at a level of 46.479 IS% ratio in bone marrow.  BCR-ABL1 p190 transcripts were detected at a level of 4 in 100,000 cells in bone marrow.    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)     01/24/2020 - 04/02/2020 Chemotherapy    IP/OP LEUKEMIA GRAAPH-2005 + RITUXIMAB < 60 YO  rituximab hypercvad (odd and even course)     02/24/2020 Remission    CR with PCR MRD+; marrow showing 70% blasts with <1% blasts, negative flow MRD, BCR-ABL p210 PCR 0.197%; CNS negative for blasts     03/09/2020 Progression    CSF - rare blast ID'ed - IT chemo given    03/13/20 - IT chemo, CSF negative     04/16/2020 Biopsy    BM Bx with 40-50% cellularity, <1% blasts, MRD flow cytometry negative, BCR-ABL p210 transcripts 0.036%     05/28/2020 - 05/28/2020 Chemotherapy    OP AML - CNS THERAPY (INTRATHECAL CYTARABINE, INTRATHECAL METHOTREXATE, OR INTRATHECAL TRIPLE)  Select one of the following: cytarabine IT 100 mg with hydrocortisone 50 mg, cytarabine IT 40 mg with methotrexate 15 mg with hydrocortisone 50 mg, OR methotrexate IT 12 mg with hydrocortisone 50 mg     06/19/2020 -  Chemotherapy    IP/OP LEUKEMIA BLINATUMOMAB 7-DAY INFUSION (MINIMAL RESIDUAL DISEASE; WT >= 22 KG) (HOME INFUSION)      Cycles 1*-4: Blinatumomab 28 mcg/day Days 1-28 of 6-week cycle.  *Given in the inpatient setting on Days 1-3 on Cycle 1 and Days 1-2 on Cycle 2, while other treatment days are given in the outpatient setting.    Cycle 1 MRD Dosing     07/18/2020 Adverse Reaction    Hospitalization: fevers. Pneumonia     07/23/2020 Biopsy    Diagnosis  Bone marrow, right iliac, aspiration and biopsy  -   Normocellular bone marrow (50%) with trilineage hematopoiesis and 1% blasts by manual aspirate differential  -   Flow cytometry MRD analysis reveals no definitive immunophenotypic evidence of residual B lymphoblastic leukemia   - BCR-ABL p210 0.006%         08/10/2020 -  Chemotherapy    Cycle 2 blinatumomab-ponatinib  Ponatinib 30mg     1 IT per cycle     09/21/2020 Adverse Reaction    Covid-19, symptomatic but not requiring hospitalization.     10/06/2020 -  Chemotherapy    Cycle 3 blinatumomab-ponatinib  Ponatinib 30mg        11/01/2020 Adverse Reaction    Hospitalization: MRSA Pneumonia requiring intubation    Blinatumomab stopped.  Ponatinib held until he stabilized.       12/14/2020 -  Chemotherapy    Cycle 4 blinatumomab 28 mcg/day days 1-28, ponatinib 30 mg per day IT triple therapy day 29     ALL (acute lymphoblastic leukemia) (CMS-HCC)   06/11/2020 -  Chemotherapy    IP/OP LEUKEMIA BLINATUMOMAB 7-DAY INFUSION (MINIMAL RESIDUAL DISEASE; WT >= 22 KG) (HOME INFUSION)  Cycles 1*-4: Blinatumomab  28 mcg/day Days 1-28 of 6-week cycle.  *Given in the inpatient setting on Days 1-3 on Cycle 1 and Days 1-2 on Cycle 2, while other treatment days are given in the outpatient setting.     09/21/2020 Initial Diagnosis    ALL (acute lymphoblastic leukemia) (CMS-HCC)     Acute lymphoblastic leukemia in remission (CMS-HCC)   06/11/2020 -  Chemotherapy    IP/OP LEUKEMIA BLINATUMOMAB 7-DAY INFUSION (MINIMAL RESIDUAL DISEASE; WT >= 22 KG) (HOME INFUSION)  Cycles 1*-4: Blinatumomab 28 mcg/day Days 1-28 of 6-week cycle.  *Given in the inpatient setting on Days 1-3 on Cycle 1 and Days 1-2 on Cycle 2, while other treatment days are given in the outpatient setting.     11/09/2020 Initial Diagnosis    Acute lymphoblastic leukemia in remission (CMS-HCC)         Past Medical History:   Diagnosis Date   ??? 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 BONE MARROW BIOPSY & ASPIRATION  01/21/2020        ??? CHG [place], CHEST,REAL TIME  11/20/2020    Procedure: ULTRASOUND, CHEST, REAL TIME WITH IMAGE DOCUMENTATION;  Surgeon: Jerelyn Charles, MD;  Location: [facility];  Service: Pulmonary   ??? IR INSERT PORT AGE GREATER THAN 5 YRS  03/10/2020    IR INSERT PORT AGE GREATER THAN 5 YRS 03/10/2020 [name], MD IMG VIR [facility]   ??? PR BRONCHOSCOPY,DIAGNOSTIC W LAVAGE Bilateral 07/20/2020    Procedure: BRONCHOSCOPY, RIGID OR FLEXIBLE, INCLUDE FLUOROSCOPIC GUIDANCE WHEN PERFORMED; W/BRONCHIAL ALVEOLAR LAVAGE WITH MODERATE SEDATION;  Surgeon: Dellis Filbert, MD;  Location: [facility];  Service: Pulmonary       Family History   Problem Relation Age of Onset   ??? Melanoma Neg Hx    ??? Basal cell carcinoma Neg Hx    ??? Squamous cell carcinoma Neg Hx         Social History     Socioeconomic History   ??? Marital status: Married   Tobacco Use   ??? Smoking status: Former     Packs/day: 2.00     Types: Cigarettes     Quit date: 01/20/2020     Years since quitting: 1.1   ??? Smokeless tobacco: Former     Types: Financial planner   ??? Vaping Use: Never used   Other Topics Concern   ??? Do you use sunscreen? No   ??? Tanning bed use? No   ??? Are you easily burned? Yes   ??? Excessive sun exposure? No   ??? Blistering sunburns? Yes     Social Determinants of Health     Financial Resource Strain: Low Risk    ??? Difficulty of Paying Living Expenses: Not very hard   Food Insecurity: No Food Insecurity   ??? Worried About Running Out of Food in the Last Year: Never true   ??? Ran Out of Food in the Last Year: Never true   Transportation Needs: No Transportation Needs   ??? Lack of Transportation (Medical): No   ??? Lack of Transportation (Non-Medical): No       Social History     Social History Narrative   ??? Not on file       Allergies: is allergic to bupropion hcl, cefepime, ceftaroline fosamil, dapsone, onion, vancomycin analogues, bismuth subsalicylate, and furosemide.  Medications:   Meds:  ??? acetaminophen  1,000 mg Oral Q8H   ??? alteplase  1 mg Intravenous Once   ??? amikacin  500 mg Nebulization BID (RT)   ??? carvediloL  12.5 mg Oral BID   ??? clonazePAM  0.5 mg Oral BID   ??? gabapentin  200 mg Oral TID   ??? heparin (porcine) for subcutaneous use  5,000 Units Subcutaneous [facility]   ??? flu vacc qs2022-23 6mos up(PF)  0.5 mL Intramuscular During hospitalization   ??? ipratropium-albuteroL  3 mL Nebulization Q6H (RT)   ??? lactulose  20 g Oral BID   ??? [START ON 03/20/2021] levoFLOXacin  750 mg Oral Daily   ??? lidocaine  1 patch Transdermal Daily   ??? linezolid  600 mg Oral Q12H SCH   ??? melatonin  3 mg Oral QPM   ??? OLANZapine zydis  5 mg Sublingual Nightly   ??? oxyCODONE  10 mg Oral [facility]   ??? senna  2 tablet Oral BID   ??? sulfamethoxazole-trimethoprim  1 tablet Oral 2 times per day on Sun Sat   ??? valACYclovir  500 mg Oral Daily     Continuous Infusions:    PRN Meds:.albuterol, bisacodyL, carboxymethylcellulose sodium, HYDROmorphone, ondansetron    Objective:   Vitals: Temp:  [36.8 ??C (98.2 ??F)-37.2 ??C (99 ??F)] 36.8 ??C (98.2 ??F)  Heart Rate:  [64-95] 72  SpO2 Pulse:  [64-95] 76  Resp:  [15-33] 21  BP: (140-155)/(57-79) 140/57  MAP (mmHg):  [79-102] 79  A BP-2: (105-235)/(51-225) 147/73  MAP:  [74 mmHg-228 mmHg] 96 mmHg  FiO2 (%):  [32 %] 32 %  SpO2:  [81 %-100 %] 94 %    Physical Exam:  BP 140/57  - Pulse 72  - Temp 36.8 ??C (98.2 ??F) (Oral)  - Resp 21  - Ht 185 cm (6' 0.84)  - Wt (!) 110.2 kg (242 lb 15.2 oz)  - SpO2 94%  - BMI 32.20 kg/m??    General appearance - Nontoxic appearing man sitting upright in ICU bed in no acute distress  Mental status - alert, awakens to voice.   Nose - [place] in place.   Neck - supple   Pulmonary - Improving aeration.   Cardiovascular- Sinus tachycardia   Gastrointestinal - Obese abdomen, distended, nontender.  Neurological - alert, conversant  Musculoskeletal - no joint tenderness, deformity or swelling   Skin - no suspicious skin lesions noted       Test Results  Recent Labs     03/17/21  0338 03/18/21  0337 03/19/21  0531   WBC 14.1* 12.0* 13.3*   NEUTROABS 12.3* 10.3* 11.6*   HGB 8.7* 7.9* 8.3*   PLT [phone]       Imaging: Radiology studies were personally reviewed

## 2021-03-21 MED ORDER — LEVOFLOXACIN 750 MG TABLET
ORAL_TABLET | Freq: Every day | ORAL | 0 refills | 1 days | Status: CP
Start: 2021-03-21 — End: ?

## 2021-03-22 NOTE — Unmapped (Signed)
Called and SW pt re appt for 12/27

## 2021-03-23 NOTE — Unmapped (Signed)
Received the following message via My Chart from patient's wife:   Sorry that he missed his last appointment with you. Adam Keith was in the ICU and then ended up on a vent for almost 2 weeks. He is back home now. On his discharge papers they have him stopping the nortripyline. I am wondering if that is because he is on the linezolid. He will be on that for the next 4 to 6 weeks. If I remember correctly they have an interaction with each other? So I don't know what you would like to do with his nortripyline but I feel that he needs something for his depression and for the fact that he is not sleeping. So if you have any thoughts that would be wonderful. If you feel like that he could stay on the nortripyline that will work as well.   Called wife back to discuss. I have asked her to continue to hold the nortriptyline for now. She will start him back on the medication at 1/2 the dose for 5 days (after he completes the linezolid) before increasing back to the full dose. Appointment scheduled for 05/14/21.

## 2021-03-23 NOTE — Unmapped (Signed)
Pt was in the hospital for 14 days and states he still has a month left of medication.

## 2021-03-25 ENCOUNTER — Telehealth
Admit: 2021-03-25 | Discharge: 2021-03-26 | Payer: MEDICAID | Attending: Student in an Organized Health Care Education/Training Program | Primary: Student in an Organized Health Care Education/Training Program

## 2021-03-25 DIAGNOSIS — Z515 Encounter for palliative care: Principal | ICD-10-CM

## 2021-03-25 NOTE — Unmapped (Signed)
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 He was recently admitted to [facility] with with complicated PNA requiring intubation.    Assessment/Plan:     #Pain: Cancer-treatment-related pain resolved since patient is done with infusions, however he now has significant somatic pain 2/2 deconditioning related to his recent ICU admission. He has been consistent in his desire to taper off opioids and is discouraged that he now needs them more than ever, though he still limits PRN doses as much as is tolerable.  - Continue oxycontin 15mg  PO BID       - Refilled 03/08/21, patient can pick up when needed  - Continue dilaudid 2mg  PO q6hr PRN        - Patient declined switching to alternate opioid       - Patient declined refill at this time  - Continue gabapentin 300mg  PO TID  - Continue APAP PRN  - Unable to tolerate NSAIDs, per patient  - Recommended hot/cold packs  - Encouraged patient to consider outpatient rehab     #Fatigue: Exacerbated by his recent hospitalization.  - Recommend rehab as above  - Continue to follow    #Mood: Patient is very discouraged and at times seems hopeless due to frequent hospitalizations and other clinical setbacks.   - Encouraged patient to f/u with psychiatry    #Advance Care Planning: Did not address this visit, but patient is still very motivated to get back to his former clinical baseline.   - Continue to follow    #Controlled substances risk management:  ??? Patient does not have a signed pain medication agreement with our team.  ??? [place] database was reviewed today and it was appropriate.  ??? Urine drug screen was not performed at this visit. Findings: not applicable.  ??? Patient has received information about safe storage and administration of medications.  ??? Patient has not received a prescription for narcan; is not applicable.     F/u: 2-3 months    ----------------------------------------  Referring Provider:??Dr. Berline Lopes  Oncology Team: Malignant hematology team  PCP:??[name], MD    HPI: 40 year old man with a diagnosis of B-ALL, diagnosed in October 2021, now in complete remission.??Recently admitted to the [facility] ICU 03/06/21-03/20/21 for AHRF d/t MRSA/klebsiella PNA, requiring intubation. This is following admissions in August, May, April, March, February, and January 2022.    He is very discouraged by all of these setbacks, and feels like whenever he takes one step forward, it's two steps back. He has significant back and hip pain due to his deconditioning that is only somewhat controlled with oxycontin and dilaudid (which he usually takes once/day); he also takes APAP. He has tried lidocaine patches, which only provide a few minutes of relief. He declined AIR at hospital discharge because he feels the exercises are silly, and isn't currently working with outpatient PT. Instead, he is trying to work through his symptoms on his own, and continues to do his daily activities even though they cause discomfort and can sometimes take a very long time.    He has consistently said he wants to wean off opioids, and was hoping to start in the new year, so is frustrated that this will likely be pushed back in the setting of his acute pain.    His mood is poor. He feels as bad as he did at the beginning of the year, which is incredibly discouraging  for hi. In particular, he doesn't like that he now has to ask others for help, when for most of his life he was the one helping others. Nonetheless, he is motivated to push through this setback and continue working toward his goals of returning to work and getting back to his hobbies.     Symptom Review:  General: Feeling bad overall  Pain: Significant somatic pain 2/2 deconditioning  Fatigue: Significant  Mobility: Limited by pain and fatigue  Mood: Poor    Palliative Performance Scale: 60% - Ambulation: Reduced / unable to do hobby or some housework, significant disease / Self-Care:Occasional assist as necessary / Intake:Normal or reduced / Level of Conscious: Full or confusion    Coping/Support Issues: Having a lot of difficulty coping with his current setback.??However, well supported by his wife and former co-workers. Also has a Patent attorney, though he hasn't seen them since discharge.    Goals of Care: Treatment and cancer-directed therapy    Social History:  Name of primary support: [name]  Occupation: Was in the [employer]	[place]  Hobbies: Training and development officer  Current residence / distance from [employer]: [facility]    Advance Care Planning: Did not discuss this visit.    Objective     Allergies:   Allergies   Allergen Reactions   ??? Bupropion Hcl Other (See Comments)     Per patient out of touch with reality, suicidal, homicidal   ??? Cefepime Rash     DRESS   ??? Ceftaroline Fosamil Rash and Other (See Comments)     Rash X 2 02/2020, suspected DRESS 06/2020   ??? 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 Onion Anaphylaxis   ??? Vancomycin Analogues      Hearing loss with Lasix  Other reaction(s): Other (See Comments)  Hearing loss  lasix   ??? Bismuth Subsalicylate Nausea And Vomiting   ??? Furosemide      With Vancomycin caused hearing loss  Other reaction(s): Other (See Comments)  With Vancomycin caused hearing loss       Family History:  Cancer-related family history is negative for Melanoma.  He indicated that the status of his neg hx is unknown.    REVIEW OF SYSTEMS:  A comprehensive review of 14 systems was negative except for pertinent positives noted in HPI.    Lab Results   Component Value Date    CREATININE 1.33 (H) 03/20/2021     Lab Results   Component Value Date    ALKPHOS 225 (H) 03/20/2021    BILITOT 0.4 03/20/2021    BILIDIR 0.30 03/20/2021    PROT 5.7 03/20/2021    ALBUMIN 2.1 (L) 03/20/2021    ALT 30 03/20/2021    AST 49 (H) 03/20/2021 The patient reports they are currently: at home. I spent 35 minutes on the real-time audio and video with the patient on the date of service. I spent an additional 15 minutes on pre- and post-visit activities on the date of service.     The patient was physically located in [place] or a state in which I am permitted to provide care. The patient and/or parent/guardian understood that s/he may incur co-pays and cost sharing, and agreed to the telemedicine visit. The visit was reasonable and appropriate under the circumstances given the patient's presentation at the time.    The patient and/or parent/guardian has been advised of the potential risks and limitations of this mode of treatment (including, but  not limited to, the absence of in-person examination) and has agreed to be treated using telemedicine. The patient's/patient's family's questions regarding telemedicine have been answered.     If the visit was completed in an ambulatory setting, the patient and/or parent/guardian has also been advised to contact their provider???s office for worsening conditions, and seek emergency medical treatment and/or call 911 if the patient deems either necessary.    Barbette Merino MD MPH  Hospice and Palliative Medicine Fellow

## 2021-03-26 DIAGNOSIS — A4902 Methicillin resistant Staphylococcus aureus infection, unspecified site: Principal | ICD-10-CM

## 2021-03-26 MED ORDER — DOXYCYCLINE HYCLATE 100 MG TABLET
ORAL_TABLET | Freq: Two times a day (BID) | ORAL | 0 refills | 30 days | Status: CP
Start: 2021-03-26 — End: ?

## 2021-03-29 DIAGNOSIS — C9100 Acute lymphoblastic leukemia not having achieved remission: Principal | ICD-10-CM

## 2021-03-30 ENCOUNTER — Ambulatory Visit: Admit: 2021-03-30 | Discharge: 2021-03-30 | Payer: MEDICAID | Attending: Adult Health | Primary: Adult Health

## 2021-03-30 ENCOUNTER — Ambulatory Visit
Admit: 2021-03-30 | Discharge: 2021-03-30 | Payer: MEDICAID | Attending: Infectious Disease | Primary: Infectious Disease

## 2021-03-30 ENCOUNTER — Other Ambulatory Visit: Admit: 2021-03-30 | Discharge: 2021-03-30 | Payer: MEDICAID

## 2021-03-30 DIAGNOSIS — C9100 Acute lymphoblastic leukemia not having achieved remission: Principal | ICD-10-CM

## 2021-03-30 DIAGNOSIS — D801 Nonfamilial hypogammaglobulinemia: Principal | ICD-10-CM

## 2021-03-30 DIAGNOSIS — A4902 Methicillin resistant Staphylococcus aureus infection, unspecified site: Principal | ICD-10-CM

## 2021-03-30 LAB — CBC W/ AUTO DIFF
BASOPHILS ABSOLUTE COUNT: 0.1 10*9/L (ref 0.0–0.1)
BASOPHILS RELATIVE PERCENT: 1.1 %
EOSINOPHILS ABSOLUTE COUNT: 0.3 10*9/L (ref 0.0–0.5)
EOSINOPHILS RELATIVE PERCENT: 3.1 %
HEMATOCRIT: 34.7 % — ABNORMAL LOW (ref 39.0–48.0)
HEMOGLOBIN: 11.2 g/dL — ABNORMAL LOW (ref 12.9–16.5)
LYMPHOCYTES ABSOLUTE COUNT: 2.3 10*9/L (ref 1.1–3.6)
LYMPHOCYTES RELATIVE PERCENT: 20.3 %
MEAN CORPUSCULAR HEMOGLOBIN CONC: 32.3 g/dL (ref 32.0–36.0)
MEAN CORPUSCULAR HEMOGLOBIN: 27.5 pg (ref 25.9–32.4)
MEAN CORPUSCULAR VOLUME: 85.2 fL (ref 77.6–95.7)
MEAN PLATELET VOLUME: 7.7 fL (ref 6.8–10.7)
MONOCYTES ABSOLUTE COUNT: 0.8 10*9/L (ref 0.3–0.8)
MONOCYTES RELATIVE PERCENT: 7.2 %
NEUTROPHILS ABSOLUTE COUNT: 7.8 10*9/L (ref 1.8–7.8)
NEUTROPHILS RELATIVE PERCENT: 68.3 %
PLATELET COUNT: 415 10*9/L (ref 150–450)
RED BLOOD CELL COUNT: 4.07 10*12/L — ABNORMAL LOW (ref 4.26–5.60)
RED CELL DISTRIBUTION WIDTH: 17.2 % — ABNORMAL HIGH (ref 12.2–15.2)
WBC ADJUSTED: 11.4 10*9/L — ABNORMAL HIGH (ref 3.6–11.2)

## 2021-03-30 LAB — COMPREHENSIVE METABOLIC PANEL
ALBUMIN: 3.3 g/dL — ABNORMAL LOW (ref 3.4–5.0)
ALKALINE PHOSPHATASE: 135 U/L — ABNORMAL HIGH (ref 46–116)
ALT (SGPT): 16 U/L (ref 10–49)
ANION GAP: 12 mmol/L (ref 5–14)
AST (SGOT): 11 U/L (ref <=34)
BILIRUBIN TOTAL: 0.4 mg/dL (ref 0.3–1.2)
BLOOD UREA NITROGEN: 10 mg/dL (ref 9–23)
BUN / CREAT RATIO: 5
CALCIUM: 9.6 mg/dL (ref 8.7–10.4)
CHLORIDE: 106 mmol/L (ref 98–107)
CO2: 25 mmol/L (ref 20.0–31.0)
CREATININE: 1.86 mg/dL — ABNORMAL HIGH
EGFR CKD-EPI (2021) MALE: 46 mL/min/{1.73_m2} — ABNORMAL LOW (ref >=60)
GLUCOSE RANDOM: 88 mg/dL (ref 70–179)
POTASSIUM: 3.7 mmol/L (ref 3.4–4.8)
PROTEIN TOTAL: 6.8 g/dL (ref 5.7–8.2)
SODIUM: 143 mmol/L (ref 135–145)

## 2021-03-30 LAB — IGG: GAMMAGLOBULIN; IGG: 462 mg/dL — ABNORMAL LOW (ref 646–2013)

## 2021-03-30 MED ORDER — DOXYCYCLINE HYCLATE 100 MG TABLET
ORAL_TABLET | Freq: Two times a day (BID) | ORAL | 3 refills | 90 days | Status: CP
Start: 2021-03-30 — End: ?

## 2021-03-30 MED ORDER — CHLORHEXIDINE GLUCONATE 4 % TOPICAL LIQUID
Freq: Every day | TOPICAL | 0 refills | 0 days | Status: CP
Start: 2021-03-30 — End: ?

## 2021-03-30 MED ORDER — MUPIROCIN 2 % TOPICAL OINTMENT
Freq: Every day | TOPICAL | 0 refills | 0 days | Status: CP
Start: 2021-03-30 — End: ?

## 2021-03-30 NOTE — Unmapped (Signed)
For your MRSA decolonization regimen you will do the following:  From Monday through Friday:  -In the evening, remove all clothing and jewelry  -get in shower and get wet  -turn off shower and soap with chlorhexidine soap  -rinse off  -dry with clean towel  -get dressed in clean pajamas  -put mupirocin in both nostrils (thin layer covering entire inside of your nose)  -get in clean bed with clean linens  -In morning, take towel, linens and pajamas and wash them on hot cycle in washing machine  -next evening repeat.    -do 3 cycles (3 weeks) of this. Then stop

## 2021-03-30 NOTE — Unmapped (Signed)
[facility]    Patient Name: Adam Keith  Patient Age: 40 y.o.  Encounter Date: 03/30/2021    Primary Care Provider:  Mick Sell, MD    Referring Physician:  Dierdre Harness, MD  [address]  [address]  [place],  [place] [address]    Reason for visit:  Ph+ B-ALL follow up to consider timing of therapy change    Assessment:  A 40 y.o. 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 The course was complicated by septic shock, candida krusei fungemia, MRSA bacteremia with septic emboli c/b acute renal failure and respiratory distress requiring dialysis and intubation.  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  He subsequently had difficulty tolerating single agent dasatinib (as a bridge to planned ponatinib-blinatumomab--had fluid retention and pleural effusion).  He then completed  4 cycles of ponatinib-blinatumomab, initiated for treatment of MRD in the setting of poor tolerance of standard therapy. He is now MRD-negative with BCR-ABL level of 0.002% on marrow.    Patient is now on ponatinib 30mg  monotherapy.     He is recovering from another major infection - MRSA/Klebsiella pneumonia - that resulted in ICU stay on ventilator. Fortunately he is recovering well. Ponatinib was held during hospitalization, it has restarted. BCR/ABL level from today is pending. He met with Dr. Verlan Friends today, ICID, who is planning long-term doxycycline with the goal of decolonizing the MRSA infection.    While in the hospital he did tolerate IVIG with premedication and slow rate of infusion. He had been refusing but with this succuss, I will call him this week to recommend continuing the IVIG outpatient.    Lingering symptoms:  - neuropathy - along left ulnar nerve path since hospitalization, likely due to positioning while in ICU - will update Dr. Genice Rouge. Patient currently on gabapentin 300mg  TID  - loss of taste - occurred during the hospital stay - focus on protein/calories/small meals    Plan and Recommendations:  Ph+ B-ALL: CSF with rare blasts 12/6, now in CR (low level BCR-ABL) and no blasts   ?? Consider reducing dose of ponatinib from 30 mg to 15 mg if BCR-ABL is negative  ?? Continue aspirin while on ponatinib  -  BCR-ABL q 3 months on peripheral blood    DRESS: followed by Dr. 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 Now s/p dialysis  - stable Cr appears to have developed CKD due to incompletely healed acute injury.  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 [name] and Dr. Verlan Friends and Dr. Maple Mirza following patient.    - long term doxycycline with goal of decolonization  **Hx Candidemia during induction - now s/p Cresemab  - followed by Dr. [name]  **Hx Covid-19   - symptoms now resolved    Symptom management: bowel regimen as listed in medication list, analgesics as listed in medication list or symptoms reviewed and due to acceptable control, no changes made  **Peripheral neuropathy, sensory and motor  - Grade 1-2 monitor  **  Chronic Back Pain, complicated by hospital stay and procedures  - Followed by Dr. Genice Rouge, Palliative Care  **Depression/anxiety: met with [name], [facility]  - nortriptyline  - PRN clonazepam  - has not been using PRN hydroxyzine    Venous access: I recommend the following change for venous access : PICC line removal     Supportive Care Recommendations:  We recommend based on the patient???s underlying diagnosis and treatment history the following supportive care:      1. Antimicrobial prophylaxis:    - Viral - valacyclovir 500mg  daily  - Bacterial - levofloxacin 500 mg if ANC <0.5  - Fungal - on treatment isavuconazole  - PJP - Bactrim DS BID Sat/Sun  - recommend Annual flu shot - declines thus far  - recommend updated Covid vaccine - declines    2. Blood product support:  I recommend the following intervals for laboratory monitoring to determine transfusion needs and monitor for hematologic effects of therapy and underlying disease: no routine monitoring outside of clinic follow-up    Leukoreduced blood products are required.  Irradiated blood products are preferred, but in case of urgent transfusion needs non-irradiated blood products may be used:     -  RBC transfusion threshold: transfuse 2 units for Hgb < 8 g/dL.  -  Platelet transfusion threshold: transfuse 1 unit of platelets for platelet count < 10, or for bleeding or need for invasive procedure.    3. Hematopoietic growth factor support: none      Care coordination: The next [facility] Follow up requested:  - see notes above      These services include the following elements of medical decision making:  addressing a hematologic cancer that poses a threat to life and/or bone marrow function  interpretation of diagnostic test reports or ordering of diagnostic tests and independent interpretation of diagnostic tests  High risk of morbidity due to drug therapy requiring monitoring for toxicity or decisions regarding goals and continuation of antineoplastic therapy      ______________________________________________________________________    I personally spent 60 minutes face-to-face and non-face-to-face in the care of this patient, which includes all pre, intra, and post visit time on the date of service.    [name], AGPCNP-BC  Nurse Practitioner  Hematology/Oncology  [facility]    Leukemia Program  Division of Hematology/Oncology  [facility]    Nurse Navigator (non-clinical trial patients): Colletta Maryland, RN        Tel. [phone]       Fax. [phone]  Toll-free appointments: [phone]  Scheduling assistance: [phone]  After hours/weekends: [phone] (ask for adult hematology/oncology on-call)          History of Present Illness:  We had the pleasure of seeing Adam Keith in the Leukemia Clinic at the [facility] on 03/30/2021.  He is a 40 y.o. male with Ph+ B-ALL.          His oncologic history is as follows:      Oncology History Overview Note   Referring/Local Oncologist: None    Diagnosis:Ph+ ALL    Genetics:    Karyotype/FISH:Abnormal Karyotype: 46,XY,t(9;22)(q34;q11.2)[1]/45,XY,der(7;9)(q10;q10)t(9;22)(q34;q11.2),der(22)t(9;22)[11]/46,sdl,+der(22)t(9;22)[5]/46,XY[3]     Abnormal FISH: A BCR/ABL1 interphase FISH assay shows an abnormal signal pattern in 97% of the 100 cells scored. 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  The findings support a diagnosis of  leukemia and have implications for targeted therapy and for monitoring residual disease.      Molecular Genetics:  BCR-ABL1 p210 transcripts were detected at a level of 46.479 IS% ratio in bone marrow.  BCR-ABL1 p190 transcripts were detected at a level of 4 in 100,000 cells in bone marrow.    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)     01/24/2020 - 04/02/2020 Chemotherapy    IP/OP LEUKEMIA GRAAPH-2005 + RITUXIMAB < 60 YO  rituximab hypercvad (odd and even course)     02/24/2020 Remission    CR with PCR MRD+; marrow showing 70% blasts with <1% blasts, negative flow MRD, BCR-ABL p210 PCR 0.197%; CNS negative for blasts 03/09/2020 Progression    CSF - rare blast ID'ed - IT chemo given    03/13/20 - IT chemo, CSF negative     04/16/2020 Biopsy    BM Bx with 40-50% cellularity, <1% blasts, MRD flow cytometry negative, BCR-ABL p210 transcripts 0.036%     05/28/2020 - 05/28/2020 Chemotherapy    OP AML - CNS THERAPY (INTRATHECAL CYTARABINE, INTRATHECAL METHOTREXATE, OR INTRATHECAL TRIPLE)  Select one of the following: cytarabine IT 100 mg with hydrocortisone 50 mg, cytarabine IT 40 mg with methotrexate 15 mg with hydrocortisone 50 mg, OR methotrexate IT 12 mg with hydrocortisone 50 mg     06/19/2020 -  Chemotherapy    IP/OP LEUKEMIA BLINATUMOMAB 7-DAY INFUSION (MINIMAL RESIDUAL DISEASE; WT >= 22 KG) (HOME INFUSION)      Cycles 1*-4: Blinatumomab 28 mcg/day Days 1-28 of 6-week cycle.  *Given in the inpatient setting on Days 1-3 on Cycle 1 and Days 1-2 on Cycle 2, while other treatment days are given in the outpatient setting.    Cycle 1 MRD Dosing     07/18/2020 Adverse Reaction    Hospitalization: fevers. Pneumonia     07/23/2020 Biopsy    Diagnosis  Bone marrow, right iliac, aspiration and biopsy  -   Normocellular bone marrow (50%) with trilineage hematopoiesis and 1% blasts by manual aspirate differential  -   Flow cytometry MRD analysis reveals no definitive immunophenotypic evidence of residual B lymphoblastic leukemia   - BCR-ABL p210 0.006%         08/10/2020 -  Chemotherapy    Cycle 2 blinatumomab-ponatinib  Ponatinib 30mg     1 IT per cycle     09/21/2020 Adverse Reaction    Covid-19, symptomatic but not requiring hospitalization.     10/06/2020 -  Chemotherapy    Cycle 3 blinatumomab-ponatinib  Ponatinib 30mg        11/01/2020 Adverse Reaction    Hospitalization: MRSA Pneumonia requiring intubation    Blinatumomab stopped.  Ponatinib held until he stabilized.       12/14/2020 -  Chemotherapy    Cycle 4 blinatumomab 28 mcg/day days 1-28, ponatinib 30 mg per day IT triple therapy day 29     ALL (acute lymphoblastic leukemia) (CMS-HCC) 06/11/2020 -  Chemotherapy    IP/OP LEUKEMIA BLINATUMOMAB 7-DAY INFUSION (MINIMAL RESIDUAL DISEASE; WT >= 22 KG) (HOME INFUSION)  Cycles 1*-4: Blinatumomab 28 mcg/day Days 1-28 of 6-week cycle.  *Given in the inpatient setting on Days 1-3 on Cycle 1 and Days 1-2 on Cycle 2, while other treatment days are given in the outpatient setting.  09/21/2020 Initial Diagnosis    ALL (acute lymphoblastic leukemia) (CMS-HCC)     Acute lymphoblastic leukemia in remission (CMS-HCC)   06/11/2020 -  Chemotherapy    IP/OP LEUKEMIA BLINATUMOMAB 7-DAY INFUSION (MINIMAL RESIDUAL DISEASE; WT >= 22 KG) (HOME INFUSION)  Cycles 1*-4: Blinatumomab 28 mcg/day Days 1-28 of 6-week cycle.  *Given in the inpatient setting on Days 1-3 on Cycle 1 and Days 1-2 on Cycle 2, while other treatment days are given in the outpatient setting.     11/09/2020 Initial Diagnosis    Acute lymphoblastic leukemia in remission (CMS-HCC)         Interim History:  Since last seen here he was hospitalized with MRSA/Klebsiella pneumonia requiring ICU level care with intubation/ventilation. See hospital course for details.    He reports that he has been recovering well. He was quite weak and using a walker at discharge. But his strength, stamina, mobility have improved significantly. Breathing feels stable.  His primary new symptoms  - loss of taste, resulting in loss of appetite. He lost 30 lbs in the hospital and is having trouble eating enough to gain it back  - left lower arm pain that shoots to pinkie and index finger. It comes/goes without clear pattern but can be severe/throbbing at time.      Past Medical, Surgical and Family History were reviewed and pertinent updates were made in the Electronic Medical Record      ECOG Performance Status: 1    Medications:    Current Outpatient Medications   Medication Sig Dispense Refill   ??? acetaminophen (TYLENOL) 325 MG tablet Take 650 mg by mouth every six (6) hours as needed for pain.      ??? albuterol HFA 90 mcg/actuation inhaler Inhale 2 puffs every six (6) hours as needed for wheezing. 8 g 11   ??? amLODIPine (NORVASC) 10 MG tablet Take 1 tablet (10 mg total) by mouth daily. 30 tablet 0   ??? aspirin 81 MG chewable tablet Chew 1 tablet (81 mg total) daily. 36 tablet 11   ??? carvediloL (COREG) 12.5 MG tablet Take 1 tablet (12.5 mg total) by mouth Two (2) times a day. 180 tablet 1   ??? clonazePAM (KLONOPIN) 0.5 MG tablet Take 1 tablet (0.5 mg total) by mouth two (2) times a day as needed for anxiety or sleep. 60 tablet 1   ??? gabapentin (NEURONTIN) 300 MG capsule Take 1 capsule (300 mg total) by mouth Three (3) times a day. 270 capsule 6   ??? HYDROmorphone (DILAUDID) 2 MG tablet Take 1 tablet (2 mg total) by mouth every six (6) hours as needed for pain,moderate (4-6). 30 tablet 0   ??? OXYCONTIN 15 mg 12 hr crush resistant ER/CR tablet Take 1 tablet (15 mg total) by mouth Two (2) times a day. 56 tablet 0   ??? PONATinib (ICLUSIG) 30 mg tablet Take 1 tablet (30 mg total) by mouth daily. Swallow tablets whole. Do not crush, break, cut or chew tablets. 30 tablet 5   ??? sulfamethoxazole-trimethoprim (BACTRIM DS) 800-160 mg per tablet Take 1 tablet (160 mg of trimethoprim total) by mouth 2 times a day on Saturday, Sunday. For prophylaxis while on chemo. 48 tablet 3   ??? umeclidinium-vilanteroL (ANORO ELLIPTA) 62.5-25 mcg/actuation inhaler Inhale 1 puff daily. 60 each 11   ??? valACYclovir (VALTREX) 500 MG tablet Take 1 tablet (500 mg total) by mouth daily. 90 tablet 11   ??? chlorhexidine (HIBICLENS) 4 % external liquid Apply topically daily. as  directed 946 mL 0   ??? doxycycline (VIBRA-TABS) 100 MG tablet Take 1 tablet (100 mg total) by mouth Two (2) times a day. 180 tablet 3   ??? mupirocin (BACTROBAN) 2 % ointment Apply topically daily. apply in each nostril prior to bed, as directed 15 g 0     No current facility-administered medications for this visit.       Vital Signs:  There were no vitals filed for this visit.  Vitals reviewed in EMR 03/30/21 and WNL    Relevant Physical Exam Findings:  General: Resting in no apparent distress, accompanied by spouse  HEENT:  Clear sclera, conjunctiva, mask in place  LYMPH:  no palpable cervical, supraclavicular, axillary, or inguinal nodes  CARDAC: RRR, no R,M,Gs, no pitting peripheral edema  RESP: nonlabored, bilateral wheezes  GI: Soft, nontender, active bowel sounds, no hepatic or splenomegaly  NEURO: alert, and oriented x4, steady gait, no focal deficits  PSYCH: appropriate, mildly anxious  DERM: no visible rashes, lesions  LINE: PICC      Relevant Laboratory, radiology and pathology results:  I personally viewed the most recent internal records, laboratory results, hematopathology report and molecular pathology reports  and discussed the available results with the patient and family  A summary of results follows:  Office Visit on 03/30/2021   Component Date Value Ref Range Status   ??? Total IgG 03/30/2021 462 (L)  646 - 2,013 mg/dL Final   Lab on 16/01/9603   Component Date Value Ref Range Status   ??? Sodium 03/30/2021 143  135 - 145 mmol/L Final   ??? Potassium 03/30/2021 3.7  3.4 - 4.8 mmol/L Final   ??? Chloride 03/30/2021 106  98 - 107 mmol/L Final   ??? CO2 03/30/2021 25.0  20.0 - 31.0 mmol/L Final   ??? Anion Gap 03/30/2021 12  5 - 14 mmol/L Final   ??? BUN 03/30/2021 10  9 - 23 mg/dL Final   ??? Creatinine 03/30/2021 1.86 (H)  0.60 - 1.10 mg/dL Final   ??? BUN/Creatinine Ratio 03/30/2021 5   Final   ??? eGFR CKD-EPI (2021) Male 03/30/2021 46 (L)  >=60 mL/min/1.62m2 Final    eGFR calculated with CKD-EPI 2021 equation in accordance with [employer] and [employer] of Nephrology Task Force recommendations.   ??? Glucose 03/30/2021 88  70 - 179 mg/dL Final   ??? Calcium 54/12/8117 9.6  8.7 - 10.4 mg/dL Final   ??? Albumin 14/78/2956 3.3 (L)  3.4 - 5.0 g/dL Final   ??? Total Protein 03/30/2021 6.8  5.7 - 8.2 g/dL Final   ??? Total Bilirubin 03/30/2021 0.4  0.3 - 1.2 mg/dL Final   ??? AST 21/30/8657 11  <=34 U/L Final   ??? ALT 03/30/2021 16  10 - 49 U/L Final   ??? Alkaline Phosphatase 03/30/2021 135 (H)  46 - 116 U/L Final   ??? Collection 03/30/2021 Collected   Final   ??? WBC 03/30/2021 11.4 (H)  3.6 - 11.2 10*9/L Final   ??? RBC 03/30/2021 4.07 (L)  4.26 - 5.60 10*12/L Final   ??? HGB 03/30/2021 11.2 (L)  12.9 - 16.5 g/dL Final   ??? HCT 84/69/6295 34.7 (L)  39.0 - 48.0 % Final   ??? MCV 03/30/2021 85.2  77.6 - 95.7 fL Final   ??? MCH 03/30/2021 27.5  25.9 - 32.4 pg Final   ??? MCHC 03/30/2021 32.3  32.0 - 36.0 g/dL Final   ??? RDW 28/41/3244 17.2 (H)  12.2 - 15.2 %  Final   ??? MPV 03/30/2021 7.7  6.8 - 10.7 fL Final   ??? Platelet 03/30/2021 415  150 - 450 10*9/L Final   ??? Neutrophils % 03/30/2021 68.3  % Final   ??? Lymphocytes % 03/30/2021 20.3  % Final   ??? Monocytes % 03/30/2021 7.2  % Final   ??? Eosinophils % 03/30/2021 3.1  % Final   ??? Basophils % 03/30/2021 1.1  % Final   ??? Absolute Neutrophils 03/30/2021 7.8  1.8 - 7.8 10*9/L Final   ??? Absolute Lymphocytes 03/30/2021 2.3  1.1 - 3.6 10*9/L Final   ??? Absolute Monocytes 03/30/2021 0.8  0.3 - 0.8 10*9/L Final   ??? Absolute Eosinophils 03/30/2021 0.3  0.0 - 0.5 10*9/L Final   ??? Absolute Basophils 03/30/2021 0.1  0.0 - 0.1 10*9/L Final   ??? Anisocytosis 03/30/2021 Slight (A)  Not Present Final     Chest X-ray PA and lateral ??Increased interstitial edema versus atypical infection/inflammation.  ??  Small right pleural effusion.    Diagnosis   Date Value Ref Range Status   03/16/2021   Final    A. Lung, right upper lobe, bronchoalveolar lavage:  - No cytologically malignant cells identified  - Abundant neutrophils present    This electronic signature is attestation that the pathologist personally reviewed the submitted material(s) and the final diagnosis reflects that evaluation.       Diagnosis   Date Value Ref Range Status   03/16/2021   Final    A. Lung, right upper lobe, bronchoalveolar lavage:  - No cytologically malignant cells identified  - Abundant neutrophils present    This electronic signature is attestation that the pathologist personally reviewed the submitted material(s) and the final diagnosis reflects that evaluation.     01/11/2021   Final    A:  Cerebrospinal fluid, cytospin review and flow cytometry   -  No blasts identified by morphology or flow cytometric analysis    This electronic signature is attestation that the pathologist personally reviewed the submitted material(s) and the final diagnosis reflects that evaluation.     01/11/2021   Final    Bone marrow, left iliac, aspiration and biopsy  -  Normocellular bone marrow (30% overall) with trilineage hematopoiesis and less than 1% blasts by manual aspirate differential  -   Flow cytometry MRD analysis reveals no definitive immunophenotypic evidence of residual B lymphoblastic leukemia (see Comment)     -  See linked reports for associated Ancillary Studies.      This electronic signature is attestation that the pathologist personally reviewed the submitted material(s) and the final diagnosis reflects that evaluation.

## 2021-03-30 NOTE — Unmapped (Signed)
[facility] Follow-Up Note    Assessment/Plan:   Adam Keith is a 40 y.o. 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 krusei fungemia  - Prior chemotherapy:??GRAAPPH-2005??induction??with dasatinib??(vincristine, dexamethasone and dasatinib);??C1D1 01/24/2020; difficulty tolerating single-agent dasatinib  - Current chemotherapy:??blinatumomab (anti-CD19/CD3) + ponatinib (TKI)??s/p??C4 on??12/14/20; 01/2021 now on ponatinib monotherapy maintenance  -??01/11/21:??Normocellular bone marrow (30% overall) with trilineage hematopoiesis and less than 1% blasts by manual aspirate differential;??Flow cytometry MRD analysis reveals no definitive immunophenotypic evidence of residual B lymphoblastic leukemia;??BCR-ABL p210 transcripts were detected at a level of 0.002 IS % ratio in bone marrow.  ??  # Hypogammaglobulimia  - 11/01/20 IgG 266 declined IVIG  - 03/11/21 IgG 148 - IVIG infusion stopped as patient developed rigors, tachycardia and HTN during infusion despite pre-medication   - 03/12/21 IgG 213 - completed IVIG with premedications and slower rate of infusion  -03/17/21 IgG 632  ??  # AKI  Estimated Creatinine Clearance: 87.7 mL/min (A) (based on SCr of 1.44 mg/dL (H)).  - b/l Cr ~1.6-1.8  - 12/9 Nephrology considering CRRT.  ??  Pertinent Co-morbidities  # COPD/emphysema  #??DIHS/DRESS??ceftaroline??06/12/2020, relapse??after cefepime??11/02/2020  - per prior ID notes possible culprits ceftaroline,??posaconazole, dasatinib, sotrovimab  -??12/14/20??discontinued??steroids  ????  Pertinent Exposure History??  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  Infection History  Recurrent (x3) severe MRSA infections (see below)  ??  Active infections:??  # Influenza??infection??without seasonal vaccination with superimposed MRSA bacterial pneumonia 03/05/21, resolving  # Klebsiella oxytoca  - ventilator associated pneumonia 03/14/21, resolving  - 02/28/21??Fever and respiratory symptoms   - 12/2 : RPP - Flu +ve  - 12/2 : CXR - Right Lower Lobe airspace disease  - 12/3: CXR worsened opacities  - 12/5: intubated for worsening hypoxemic respiratory failure with concern for ARDS  - 12/6 tracheal aspirate: MRSA  - 12/9 IVIg  - 12/11 Lower resp Cx - staph aureus 2+ , klebsiella oxytoca ( sens : Levofloxacin, amikacin, tobramycin)  - 12/11 CT Chest : Multifocal consolidative airspace disease, nonspecific small pleural effusion in the dependent aspect of the left pleural space and trace loculated collections within the lateral aspect of the right chest.  -12/11 CT abdomen: Mild circumferential wall thickening with adjacent soft tissue stranding involving the distal sigmoid colon/rectum. Findings are nonspecific although can be seen in the setting of early colitis/proctitis  Tx : Linezolid /Oseltamivir---> Linezolid/aztreonam/flagyl/azithromycin/oseltamivir 12/3 --> linezolid/aztreonam/osteltamivir 12/3 -->linezolid [name] 12/5--->linezolid 12/6 -->linezolid/aztreonam 11/13--->linezolid/levofloxacin/inhaled amikacin 12/14  ??  #Left upper chest, pressure ulcer from proning 03/11/21, improving  ??  Prior infections:  #MRSA??RLL PNA c/b bacteremia 11/01/2020, probable??right empyema 8/12/202;??presumed relapsed??RML??MRSA pneumonia 11/18/2020  - 8/19 Failed??thoracocentesis by IP  - 9/26 CT chest parenchyma without e/o infection, persistent but decreased loculated effusions on right  - s/p??MRSA antibiotics until 01/04/2021  #Persistent right chest pain 12/29/20;??Low grade fever, weakness 01/28/21  - 02/02/2021 - Dr. 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    Antimicrobial Intolerance/allergy  Cefepime - DRESS  Ceftaroline -??DRESS  Dapsone - possible??agranulocytosis, per chart anaphylaxis  Vancomycin - probable ototoxicity (in combination??with furosemide)  Isavuconazole - elevated LFTs in the setting of TKI    RECOMMENDATIONS:  cont doxycycline  will attempt MRSA decolonization protocol  For your MRSA decolonization regimen you will do the following:  From Monday through Friday:  -In the evening, remove all clothing and jewelry  -get in shower and get wet  -turn off shower and soap with chlorhexidine soap  -rinse off  -dry with clean towel  -get dressed in clean pajamas  -put mupirocin in both nostrils (thin layer covering entire inside of your nose)  -get in clean bed with clean linens  -In morning, take towel, linens and pajamas and wash them on hot cycle in washing machine  -next evening repeat.    -do 3 cycles (3 weeks) of this. Then stop      Adam Keith  Immunocompromised Host Infectious Diseases  Pager [phone]    Subjective:   Interval History:   he is feeling well. still has a cough, but improving and non-productive.  Denies any fevers, chills or sweats.  still feels weak and low energy, slowly improving after ICU stay.  He is taking doxycycline 100mg  bid.      medications  Current Outpatient Medications   Medication Sig Dispense Refill   ??? acetaminophen (TYLENOL) 325 MG tablet Take 650 mg by mouth every six (6) hours as needed for pain.      ??? albuterol HFA 90 mcg/actuation inhaler Inhale 2 puffs every six (6) hours as needed for wheezing. 8 g 11   ??? amLODIPine (NORVASC) 10 MG tablet Take 1 tablet (10 mg total) by mouth daily. 30 tablet 0   ??? aspirin 81 MG chewable tablet Chew 1 tablet (81 mg total) daily. 36 tablet 11   ??? carvediloL (COREG) 12.5 MG tablet Take 1 tablet (12.5 mg total) by mouth Two (2) times a day. 180 tablet 1   ??? clonazePAM (KLONOPIN) 0.5 MG tablet Take 1 tablet (0.5 mg total) by mouth two (2) times a day as needed for anxiety or sleep. 60 tablet 1   ??? doxycycline (VIBRA-TABS) 100 MG tablet Take 1 tablet (100 mg total) by mouth Two (2) times a day. 60 tablet 0   ??? gabapentin (NEURONTIN) 300 MG capsule Take 1 capsule (300 mg total) by mouth Three (3) times a day. 270 capsule 6   ??? HYDROmorphone (DILAUDID) 2 MG tablet Take 1 tablet (2 mg total) by mouth every six (6) hours as needed for pain,moderate (4-6). 30 tablet 0   ??? OXYCONTIN 15 mg 12 hr crush resistant ER/CR tablet Take 1 tablet (15 mg total) by mouth Two (2) times a day. 56 tablet 0   ??? PONATinib (ICLUSIG) 30 mg tablet Take 1 tablet (30 mg total) by mouth daily. Swallow tablets whole. Do not crush, break, cut or chew tablets. 30 tablet 5   ??? sulfamethoxazole-trimethoprim (BACTRIM DS) 800-160 mg per tablet Take 1 tablet (160 mg of trimethoprim total) by mouth 2 times a day on Saturday, Sunday. For prophylaxis while on chemo. 48 tablet 3   ??? umeclidinium-vilanteroL (ANORO ELLIPTA) 62.5-25 mcg/actuation inhaler Inhale 1 puff daily. 60 each 11   ??? valACYclovir (VALTREX) 500 MG tablet Take 1 tablet (500 mg total) by mouth daily. 90 tablet 11     No current  facility-administered medications for this visit.     Objective:  Physical Exam:  heent no thrush, no oral ulcers  Conjunctivae clear  Neck supple  Spine non-tender  No CVA tenderness  Lungs cta  Heart s1,s2, no murmurs  abd soft, nt/nd  Ext no joint effusions  No edema  No rashes  Neuro: AOx3, no abnormal movements noted    LABS:  Lab Results   Component Value Date    WBC 11.4 (H) 03/30/2021    WBC 12.2 (H) 03/20/2021    WBC 13.3 (H) 03/19/2021    WBC 12.0 (H) 03/18/2021    WBC 14.1 (H) 03/17/2021     Lab Results   Component Value Date    Creatinine 1.33 (H) 03/20/2021    Creatinine 1.44 (H) 03/19/2021    Creatinine 1.63 (H) 03/18/2021    Creatinine 1.89 (H) 03/17/2021    Creatinine 2.02 (H) 03/17/2021     Lab Results   Component Value Date    Sed Rate 30 (H) 07/18/2020    CRP 44.0 (H) 12/28/2020    Total IgG 632 (L) 03/16/2021

## 2021-04-07 NOTE — Unmapped (Signed)
Addended by: Marita Kansas on: 04/07/2021 08:43 AM     Modules accepted: Level of Service

## 2021-04-12 ENCOUNTER — Other Ambulatory Visit: Admit: 2021-04-12 | Discharge: 2021-04-13 | Payer: MEDICAID

## 2021-04-12 ENCOUNTER — Ambulatory Visit: Admit: 2021-04-12 | Discharge: 2021-04-13 | Payer: MEDICAID

## 2021-04-12 DIAGNOSIS — C9100 Acute lymphoblastic leukemia not having achieved remission: Principal | ICD-10-CM

## 2021-04-12 LAB — CBC W/ AUTO DIFF
BASOPHILS ABSOLUTE COUNT: 0.1 10*9/L (ref 0.0–0.1)
BASOPHILS RELATIVE PERCENT: 1.3 %
EOSINOPHILS ABSOLUTE COUNT: 0.8 10*9/L — ABNORMAL HIGH (ref 0.0–0.5)
EOSINOPHILS RELATIVE PERCENT: 7.2 %
HEMATOCRIT: 37.7 % — ABNORMAL LOW (ref 39.0–48.0)
HEMOGLOBIN: 12.2 g/dL — ABNORMAL LOW (ref 12.9–16.5)
LYMPHOCYTES ABSOLUTE COUNT: 2.4 10*9/L (ref 1.1–3.6)
LYMPHOCYTES RELATIVE PERCENT: 21.7 %
MEAN CORPUSCULAR HEMOGLOBIN CONC: 32.5 g/dL (ref 32.0–36.0)
MEAN CORPUSCULAR HEMOGLOBIN: 27.7 pg (ref 25.9–32.4)
MEAN CORPUSCULAR VOLUME: 85.4 fL (ref 77.6–95.7)
MEAN PLATELET VOLUME: 8.2 fL (ref 6.8–10.7)
MONOCYTES ABSOLUTE COUNT: 0.7 10*9/L (ref 0.3–0.8)
MONOCYTES RELATIVE PERCENT: 6.4 %
NEUTROPHILS ABSOLUTE COUNT: 7 10*9/L (ref 1.8–7.8)
NEUTROPHILS RELATIVE PERCENT: 63.4 %
PLATELET COUNT: 309 10*9/L (ref 150–450)
RED BLOOD CELL COUNT: 4.42 10*12/L (ref 4.26–5.60)
RED CELL DISTRIBUTION WIDTH: 16.7 % — ABNORMAL HIGH (ref 12.2–15.2)
WBC ADJUSTED: 11 10*9/L (ref 3.6–11.2)

## 2021-04-12 LAB — COMPREHENSIVE METABOLIC PANEL
ALBUMIN: 3.8 g/dL (ref 3.4–5.0)
ALKALINE PHOSPHATASE: 117 U/L — ABNORMAL HIGH (ref 46–116)
ALT (SGPT): <7 U/L — ABNORMAL LOW (ref 10–49)
ANION GAP: 10 mmol/L (ref 5–14)
AST (SGOT): 13 U/L (ref <=34)
BILIRUBIN TOTAL: 0.3 mg/dL (ref 0.3–1.2)
BLOOD UREA NITROGEN: 14 mg/dL (ref 9–23)
BUN / CREAT RATIO: 8
CALCIUM: 9.6 mg/dL (ref 8.7–10.4)
CHLORIDE: 106 mmol/L (ref 98–107)
CO2: 24 mmol/L (ref 20.0–31.0)
CREATININE: 1.74 mg/dL — ABNORMAL HIGH
EGFR CKD-EPI (2021) MALE: 50 mL/min/{1.73_m2} — ABNORMAL LOW (ref >=60)
GLUCOSE RANDOM: 92 mg/dL (ref 70–179)
POTASSIUM: 4.4 mmol/L (ref 3.4–4.8)
PROTEIN TOTAL: 7.1 g/dL (ref 5.7–8.2)
SODIUM: 140 mmol/L (ref 135–145)

## 2021-04-12 MED ADMIN — sodium chloride (NS) 0.9 % infusion: 20 mL/h | INTRAVENOUS | @ 20:00:00 | Stop: 2021-04-12

## 2021-04-12 MED ADMIN — methylPREDNISolone sodium succinate (PF) (Solu-MEDROL) injection 125 mg: 125 mg | INTRAVENOUS | @ 22:00:00

## 2021-04-12 MED ADMIN — meperidine (DEMEROL) injection 12.5 mg: 12.5 mg | INTRAVENOUS | @ 23:00:00 | Stop: 2021-04-12

## 2021-04-12 MED ADMIN — immune globulin, IgA less than 1 microgram/mL (GAMMAGARD S/D) 5% intravenous solution 30 g: .4 g/kg | INTRAVENOUS | @ 20:00:00 | Stop: 2021-04-12

## 2021-04-12 MED ADMIN — acetaminophen (TYLENOL) tablet 650 mg: 650 mg | ORAL | @ 20:00:00 | Stop: 2021-04-12

## 2021-04-12 MED ADMIN — diphenhydrAMINE (BENADRYL) capsule/tablet 25 mg: 25 mg | ORAL | @ 20:00:00 | Stop: 2021-04-12

## 2021-04-12 MED ADMIN — famotidine (PF) (PEPCID) injection 20 mg: 20 mg | INTRAVENOUS | @ 22:00:00

## 2021-04-12 MED ADMIN — meperidine (DEMEROL) injection 25 mg: 25 mg | INTRAVENOUS | @ 22:00:00 | Stop: 2021-04-12

## 2021-04-12 MED ADMIN — meperidine (DEMEROL) 25 mg/mL injection: INTRAVENOUS | @ 23:00:00 | Stop: 2021-04-12

## 2021-04-12 MED ADMIN — diphenhydrAMINE (BENADRYL) injection 25 mg: 25 mg | INTRAVENOUS | @ 22:00:00

## 2021-04-12 NOTE — Unmapped (Signed)
PIV placed.  Labs drawn & sent for analysis. To next appt.  Care provided by Daniel Maxwell RN.

## 2021-04-12 NOTE — Unmapped (Signed)
[facility] Note:    Paged re: sCr 1.74, IVIG instructions list sCr greater than 1.7 as risk factor for infusion and may benefit from lower concentration (5%).    Ok to proceed with IVIG today. Renal function within baseline and IVIG has already been converted to lower concentration.         Lavonda Jumbo, AGNP-BC  [facility]

## 2021-04-13 NOTE — Unmapped (Signed)
Lab on 04/12/2021   Component Date Value Ref Range Status    Sodium 04/12/2021 140  135 - 145 mmol/L Final    Potassium 04/12/2021 4.4  3.4 - 4.8 mmol/L Final    Chloride 04/12/2021 106  98 - 107 mmol/L Final    CO2 04/12/2021 24.0  20.0 - 31.0 mmol/L Final    Anion Gap 04/12/2021 10  5 - 14 mmol/L Final    BUN 04/12/2021 14  9 - 23 mg/dL Final    Creatinine 29/52/8413 1.74 (H)  0.60 - 1.10 mg/dL Final    BUN/Creatinine Ratio 04/12/2021 8   Final    eGFR CKD-EPI (2021) Male 04/12/2021 50 (L)  >=60 mL/min/1.75m2 Final    eGFR calculated with CKD-EPI 2021 equation in accordance with [employer] and [employer] of Nephrology Task Force recommendations.    Glucose 04/12/2021 92  70 - 179 mg/dL Final    Calcium 24/40/1027 9.6  8.7 - 10.4 mg/dL Final    Albumin 25/36/6440 3.8  3.4 - 5.0 g/dL Final    Total Protein 04/12/2021 7.1  5.7 - 8.2 g/dL Final    Total Bilirubin 04/12/2021 0.3  0.3 - 1.2 mg/dL Final    AST 34/74/2595 13  <=34 U/L Final    ALT 04/12/2021 <7 (L)  10 - 49 U/L Final    Alkaline Phosphatase 04/12/2021 117 (H)  46 - 116 U/L Final    WBC 04/12/2021 11.0  3.6 - 11.2 10*9/L Final    RBC 04/12/2021 4.42  4.26 - 5.60 10*12/L Final    HGB 04/12/2021 12.2 (L)  12.9 - 16.5 g/dL Final    HCT 63/87/5643 37.7 (L)  39.0 - 48.0 % Final    MCV 04/12/2021 85.4  77.6 - 95.7 fL Final    MCH 04/12/2021 27.7  25.9 - 32.4 pg Final    MCHC 04/12/2021 32.5  32.0 - 36.0 g/dL Final    RDW 32/95/1884 16.7 (H)  12.2 - 15.2 % Final    MPV 04/12/2021 8.2  6.8 - 10.7 fL Final    Platelet 04/12/2021 309  150 - 450 10*9/L Final    Neutrophils % 04/12/2021 63.4  % Final    Lymphocytes % 04/12/2021 21.7  % Final    Monocytes % 04/12/2021 6.4  % Final    Eosinophils % 04/12/2021 7.2  % Final    Basophils % 04/12/2021 1.3  % Final    Absolute Neutrophils 04/12/2021 7.0  1.8 - 7.8 10*9/L Final    Absolute Lymphocytes 04/12/2021 2.4  1.1 - 3.6 10*9/L Final    Absolute Monocytes 04/12/2021 0.7  0.3 - 0.8 10*9/L Final Absolute Eosinophils 04/12/2021 0.8 (H)  0.0 - 0.5 10*9/L Final    Absolute Basophils 04/12/2021 0.1  0.0 - 0.1 10*9/L Final    Anisocytosis 04/12/2021 Slight (A)  Not Present Final

## 2021-04-13 NOTE — Unmapped (Signed)
1825; Pt states that he feels back to normal again. Will discharge Pt.

## 2021-04-13 NOTE — Unmapped (Signed)
[facility] Refill Coordination Note    Specialty Medication(s) to be Shipped:   Hematology/Oncology: Iclusig    Other medication(s) to be shipped: No additional medications requested for fill at this time     Adam Keith, DOB: [birth year]  Phone: [phone] (home)       All above HIPAA information was verified with patient.     Was a Nurse, learning disability used for this call? No    Completed refill call assessment today to schedule patient's medication shipment from the [facility] [phone]).  All relevant notes have been reviewed.     Specialty medication(s) and dose(s) confirmed: Regimen is correct and unchanged.   Changes to medications: Adam Keith reports no changes at this time.  Changes to insurance: No  New side effects reported not previously addressed with a pharmacist or physician: None reported  Questions for the pharmacist: No    Confirmed patient received a Conservation officer, historic buildings and a Surveyor, mining with first shipment. The patient will receive a drug information handout for each medication shipped and additional FDA Medication Guides as required.       DISEASE/MEDICATION-SPECIFIC INFORMATION        N/A    SPECIALTY MEDICATION ADHERENCE     Medication Adherence    Patient reported X missed doses in the last month: 0  Specialty Medication: Iclusig 30 mg  Patient is on additional specialty medications: No  Informant: patient              Were doses missed due to medication being on hold? No    Iclusig 30 mg: 10 days of medicine on hand       REFERRAL TO PHARMACIST     Referral to the pharmacist: Not needed      [facility]     Shipping address confirmed in Epic.     Delivery Scheduled: Yes, Expected medication delivery date: 04/16/21.     Medication will be delivered via UPS to the prescription address in Epic [place].    Adam Keith   [facility] Specialty Technician

## 2021-04-13 NOTE — Unmapped (Signed)
Infusion Progress Note    Patient Name: Adam Keith  Patient Age: 41 y.o.  Encounter Date: 04/12/2021  Allergies   Allergen Reactions   ??? Bupropion Hcl Other (See Comments)     Per patient out of touch with reality, suicidal, homicidal   ??? Cefepime Rash     DRESS   ??? Ceftaroline Fosamil Rash and Other (See Comments)     Rash X 2 02/2020, suspected DRESS 06/2020   ??? 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 Onion Anaphylaxis   ??? Vancomycin Analogues      Hearing loss with Lasix  Other reaction(s): Other (See Comments)  Hearing loss  lasix   ??? Bismuth Subsalicylate Nausea And Vomiting   ??? Privigen [Immun Glob G(Igg)-Pro-Iga 0-50] Other (See Comments)     03/10/2021 VIG infusion stopped as patient developed rigors, tachycardia and HTN during infusion despite pre-medication; 03/12/21 completed IVIG with premedications and slower rate of infusion   ??? Furosemide      With Vancomycin caused hearing loss  Other reaction(s): Other (See Comments)  With Vancomycin caused hearing loss       Reason for visit  Infusion Center visit   Chief Complaint: hypersensitivity reaction to IVIG    I spent at least 40 minutes with this patient: assessing, performing physical exam with > 50% of time spent in counseling.     Assessment/Plan:  Hypersensitivity reaction Grade 2   -premedications: acetaminophen 650mg  PO, diphenhydramine 25mg  PO  -symptoms: SOB, head pressure, rigors  -amount infused: ~211ml  -reaction medications: diphenhydramine 25mg  IV, famotidine 20mg  IV, methylprednisolone 125mg  IV, meperidine 25mg  IV  -disposition: OK to discharge after symptoms resolve     I have notified the case (exam, laboratory results, and plan) to [name], NP. Mr. Milles is aware that any changes to his plan for infusion are pending at the time of discharge.     Interval History/HPI:   [name] is a 41 y.o. 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 [name] tolerated his first bottle of IVIG without issue through all dose titrations. Upon starting the second bottle, which remained at the same rate, he had sudden onset shortness of breath. He indicated that it felt like he could only take shallow breaths which is why he was breathing rapidly. He also started having rigors and head pressure. Infusion was immediately paused and he received reaction medications as above.     He did become hypertensive and initially hypoxic to 87% on RA. He recovered to 100% on 2L [place] within a minute of applying oxygen.     He denied chest pain, palpitations, cough, abdominal pain, fever, chills, back pain, or any other symptoms of hypersensitivity reaction.     He declined to re-challenge as he has had reactions to IVIG in the past and was anxious about his infusion today. He discharged home under self care as soon as he felt back to baseline.     Review of Systems:  A complete review of systems was obtained including: Constitutional, Eyes, ENT, Cardiovascular, Respiratory, GI, GU, Musculoskeletal, Skin, Neurological, Psychiatric, Endocrine, Heme/Lymphatic, and Allergic/Immunologic systems. It is negative or non-contributory to the patient???s management except for positives mentioned in HPI.     Physical Exam:  Vital Signs:  Vitals:    04/12/21 1715 04/12/21 1725   BP: 157/73 129/96   Pulse: 89 100   Resp: 24 22  SpO2: 100%        Physical Exam  Vitals reviewed.   Constitutional:       General: He is awake.      Appearance: Normal appearance.      Comments: Male sitting upright in recliner, breathing rapidly and clutching hands together. Appears in mild distress. Presents alone   Cardiovascular:      Rate and Rhythm: Normal rate and regular rhythm.      Heart sounds: Normal heart sounds. No murmur heard.    No friction rub. No gallop.   Pulmonary:      Effort: Tachypnea present.      Breath sounds: Normal breath sounds. No decreased breath sounds, wheezing, rhonchi or rales. Skin:     General: Skin is warm and dry.   Neurological:      General: No focal deficit present.      Mental Status: He is alert and oriented to person, place, and time.      Motor: Motor function is intact.      Gait: Gait is intact.   Psychiatric:         Attention and Perception: Attention normal.         Mood and Affect: Mood normal.         Speech: Speech normal.         Behavior: Behavior is cooperative.         Results:  Lab on 04/12/2021   Component Date Value Ref Range Status   ??? Sodium 04/12/2021 140  135 - 145 mmol/L Final   ??? Potassium 04/12/2021 4.4  3.4 - 4.8 mmol/L Final   ??? Chloride 04/12/2021 106  98 - 107 mmol/L Final   ??? CO2 04/12/2021 24.0  20.0 - 31.0 mmol/L Final   ??? Anion Gap 04/12/2021 10  5 - 14 mmol/L Final   ??? BUN 04/12/2021 14  9 - 23 mg/dL Final   ??? Creatinine 04/12/2021 1.74 (H)  0.60 - 1.10 mg/dL Final   ??? BUN/Creatinine Ratio 04/12/2021 8   Final   ??? eGFR CKD-EPI (2021) Male 04/12/2021 50 (L)  >=60 mL/min/1.80m2 Final    eGFR calculated with CKD-EPI 2021 equation in accordance with [employer] and [employer] of Nephrology Task Force recommendations.   ??? Glucose 04/12/2021 92  70 - 179 mg/dL Final   ??? Calcium 16/01/9603 9.6  8.7 - 10.4 mg/dL Final   ??? Albumin 54/12/8117 3.8  3.4 - 5.0 g/dL Final   ??? Total Protein 04/12/2021 7.1  5.7 - 8.2 g/dL Final   ??? Total Bilirubin 04/12/2021 0.3  0.3 - 1.2 mg/dL Final   ??? AST 14/78/2956 13  <=34 U/L Final   ??? ALT 04/12/2021 <7 (L)  10 - 49 U/L Final   ??? Alkaline Phosphatase 04/12/2021 117 (H)  46 - 116 U/L Final   ??? WBC 04/12/2021 11.0  3.6 - 11.2 10*9/L Final   ??? RBC 04/12/2021 4.42  4.26 - 5.60 10*12/L Final   ??? HGB 04/12/2021 12.2 (L)  12.9 - 16.5 g/dL Final   ??? HCT 21/30/8657 37.7 (L)  39.0 - 48.0 % Final   ??? MCV 04/12/2021 85.4  77.6 - 95.7 fL Final   ??? MCH 04/12/2021 27.7  25.9 - 32.4 pg Final   ??? MCHC 04/12/2021 32.5  32.0 - 36.0 g/dL Final   ??? RDW 84/69/6295 16.7 (H)  12.2 - 15.2 % Final   ??? MPV 04/12/2021 8.2  6.8 - 10.7 fL Final   ???  Platelet 04/12/2021 309  150 - 450 10*9/L Final   ??? Neutrophils % 04/12/2021 63.4  % Final   ??? Lymphocytes % 04/12/2021 21.7  % Final   ??? Monocytes % 04/12/2021 6.4  % Final   ??? Eosinophils % 04/12/2021 7.2  % Final   ??? Basophils % 04/12/2021 1.3  % Final   ??? Absolute Neutrophils 04/12/2021 7.0  1.8 - 7.8 10*9/L Final   ??? Absolute Lymphocytes 04/12/2021 2.4  1.1 - 3.6 10*9/L Final   ??? Absolute Monocytes 04/12/2021 0.7  0.3 - 0.8 10*9/L Final   ??? Absolute Eosinophils 04/12/2021 0.8 (H)  0.0 - 0.5 10*9/L Final   ??? Absolute Basophils 04/12/2021 0.1  0.0 - 0.1 10*9/L Final   ??? Anisocytosis 04/12/2021 Slight (A)  Not Present Final       Lavonda Jumbo, AGNP-BC  [facility]

## 2021-04-13 NOTE — Unmapped (Signed)
Patient arrived to chair 21. Pre medications was given as ordered. IVIG was started. Patient had a reaction see note from APP. Pt did not want to re challenge medication at this time. Report given to Laren Everts RN for continuing monitoring of patient at this time.

## 2021-04-14 NOTE — Unmapped (Signed)
Called and LM for pt re appt on 04/23/21

## 2021-04-15 MED FILL — ICLUSIG 30 MG TABLET: ORAL | 30 days supply | Qty: 30 | Fill #4

## 2021-04-22 ENCOUNTER — Telehealth
Admit: 2021-04-22 | Discharge: 2021-04-23 | Payer: MEDICAID | Attending: Student in an Organized Health Care Education/Training Program | Primary: Student in an Organized Health Care Education/Training Program

## 2021-04-22 DIAGNOSIS — G5622 Lesion of ulnar nerve, left upper limb: Principal | ICD-10-CM

## 2021-04-22 MED ORDER — GABAPENTIN 300 MG CAPSULE
ORAL_CAPSULE | 5 refills | 0 days | Status: CP
Start: 2021-04-22 — End: ?

## 2021-04-22 MED ORDER — OXYCONTIN 15 MG TABLET,CRUSH RESISTANT,EXTENDED RELEASE
ORAL_TABLET | Freq: Two times a day (BID) | ORAL | 0 refills | 28 days | Status: CP
Start: 2021-04-22 — End: ?

## 2021-04-22 MED ORDER — HYDROMORPHONE 2 MG TABLET
ORAL_TABLET | Freq: Two times a day (BID) | ORAL | 0 refills | 28 days | Status: CP | PRN
Start: 2021-04-22 — End: 2022-04-22

## 2021-04-22 NOTE — Unmapped (Signed)
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 He was recently admitted to [facility] with with complicated PNA requiring intubation. Seen today for evaluation of LUE pain.    Assessment/Plan:     # LUE pain: Sounds most c/w ulnar neuropathy/neuropraxia, possibly d/t positioning while in the ICU, in setting of severe illness. Currently uncontrolled.  - Increase gabapentin to 300mg -300mg -600mg        - Patient advised to decrease bedtime dose to 300mg  if bothersome sedation  - Order dilaudid 2mg  PO BID PRN       - PDMP reviewed, last fill 12/2020, ok to fill today  - Recommend wrist brace; will see if this can be ordered as DME  - Encouraged patient to avoid prolonged wrist/elbow flexion  - If no improvement, recommend further eval (e.g. EMG, OT); PCP notified of plan    #Chronic pain: Did not discuss today, however patient is due for a refill of oxycontin.  - Continue oxycontin 15mg  PO BID       - PDMP reviewed, renewed today, ok to fill 04/23/21    #Controlled substances risk management:  ??? Patient does not have a signed pain medication agreement with our team.  ??? [place] database was reviewed today and it was??appropriate.  ??? Urine drug screen was not??performed at this visit. Findings: not applicable.  ??? Patient has received information about safe storage and administration of medications.  ??? Patient has not??received a prescription for narcan; is not applicable.     F/u: 06/03/21    ----------------------------------------  Referring Provider:??Dr. Berline Lopes  Oncology Team: Malignant hematology team  PCP:??[name], MD    HPI: 41 year old man with a diagnosis of B-ALL, diagnosed in October 2021, now in complete remission.??Recently admitted to the [facility] ICU 03/06/21-03/20/21 for AHRF d/t MRSA/klebsiella PNA, requiring intubation. This is following admissions in August, May, April, March, February, and January 2022.    Since his last discharge he has experienced new pain in his LUE. It primarily affects the 4th/5th fingers and ulnar side of the palm, and feels sharp/stabbing. Sometimes he gets shocks in the area, which radiate to the shoulder. No pain in the neck/chest. Pain is constant and peaks at 9/10; dilaudid/oxycontin can bring it to a 6-7/10 (prior to this he rarely used dilaudid, relying on the same rx from September, however due to increased use he recently ran out). No clear triggers but is often worse at night, and patient has been sleeping very poorly as a result. He also noted decreased mobility in the 4th/5th fingers, with limited extension and a weak grip. This is especially bothersome because he is LHD.     Palliative Performance Scale: 70% - Ambulation: Reduced / unable to do normal work, some evidence of disease / Self-Care: Full / Intake: Normal or reduced / Level of Conscious: Full    Coping/Support Issues: Having a lot of difficulty coping with his current setback.??However, well supported by his wife and former co-workers. Also has a Patent attorney, though he hasn't seen them since discharge.  ??  Goals of Care: Treatment and cancer-directed therapy  ??  Social History:  Name of primary support: [name]  Occupation: Was in the [employer]	[place]  Hobbies: Training and development officer  Current residence / distance from [facility]: [place]  ??  Advance Care Planning: Did not discuss this visit.    Objective     Allergies:   Allergies  Allergen Reactions   ??? Bupropion Hcl Other (See Comments)     Per patient out of touch with reality, suicidal, homicidal   ??? Cefepime Rash     DRESS   ??? Ceftaroline Fosamil Rash and Other (See Comments)     Rash X 2 02/2020, suspected DRESS 06/2020   ??? 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 Onion Anaphylaxis   ??? Vancomycin Analogues      Hearing loss with Lasix  Other reaction(s): Other (See Comments)  Hearing loss  lasix   ??? Bismuth Subsalicylate Nausea And Vomiting   ??? Privigen [Immun Glob G(Igg)-Pro-Iga 0-50] Other (See Comments)     03/10/2021 VIG infusion stopped as patient developed rigors, tachycardia and HTN during infusion despite pre-medication; 03/12/21 completed IVIG with premedications and slower rate of infusion   ??? Furosemide      With Vancomycin caused hearing loss  Other reaction(s): Other (See Comments)  With Vancomycin caused hearing loss       Family History:  Cancer-related family history is negative for Melanoma.  He indicated that the status of his neg hx is unknown.      REVIEW OF SYSTEMS:  A comprehensive review of 10 systems was negative except for pertinent positives noted in HPI.    Lab Results   Component Value Date    CREATININE 1.74 (H) 04/12/2021     Lab Results   Component Value Date    ALKPHOS 117 (H) 04/12/2021    BILITOT 0.3 04/12/2021    BILIDIR 0.30 03/20/2021    PROT 7.1 04/12/2021    ALBUMIN 3.8 04/12/2021    ALT <7 (L) 04/12/2021    AST 13 04/12/2021          The patient reports they are currently: at home. I spent 25 minutes on the real-time audio and video with the patient on the date of service. I spent an additional 15 minutes on pre- and post-visit activities on the date of service.   ??  The patient was physically located in [place] or a state in which I am permitted to provide care. The patient and/or parent/guardian understood that s/he may incur co-pays and cost sharing, and agreed to the telemedicine visit. The visit was reasonable and appropriate under the circumstances given the patient's presentation at the time.  ??  The patient and/or parent/guardian has been advised of the potential risks and limitations of this mode of treatment (including, but not limited to, the absence of in-person examination) and has agreed to be treated using telemedicine. The patient's/patient's family's questions regarding telemedicine have been answered.   ??  If the visit was completed in an ambulatory setting, the patient and/or parent/guardian has also been advised to contact their provider???s office for worsening conditions, and seek emergency medical treatment and/or call 911 if the patient deems either necessary.    Barbette Merino MD MPH  Hospice and Palliative Medicine Fellow

## 2021-04-23 ENCOUNTER — Ambulatory Visit
Admit: 2021-04-23 | Discharge: 2021-04-24 | Payer: MEDICAID | Attending: Infectious Disease | Primary: Infectious Disease

## 2021-04-23 DIAGNOSIS — C9100 Acute lymphoblastic leukemia not having achieved remission: Principal | ICD-10-CM

## 2021-04-23 DIAGNOSIS — J15212 Pneumonia due to Methicillin resistant Staphylococcus aureus: Principal | ICD-10-CM

## 2021-04-23 NOTE — Unmapped (Signed)
[facility] orders placed for patient to have local labs prior to 04/27/21 appt.  Will send patient a mychart message.

## 2021-04-23 NOTE — Unmapped (Signed)
I was immediately available via phone/pager or present on site.  I reviewed and discussed the case with the fellow, but did not see the patient.  I agree with the assessment and plan as documented in the fellow's note. Rico Ala, MD

## 2021-04-23 NOTE — Unmapped (Signed)
IMMUNOCOMPROMISED HOST INFECTIOUS DISEASE PROGRESS NOTE    Assessment/Plan:     [name] is a 41 y.o. 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 krusei fungemia  - Prior chemotherapy: [id number]-2005 induction with dasatinib (vincristine, dexamethasone and dasatinib); C1D1 01/24/2020; difficulty tolerating single-agent dasatinib  - Current chemotherapy: blinatumomab (anti-CD19/CD3) + ponatinib (TKI) s/p C4 on 12/14/20; 01/2021 now on ponatinib monotherapy maintenance  - 01/11/21: Normocellular bone marrow (30% overall) with trilineage hematopoiesis and less than 1% blasts by manual aspirate differential; Flow cytometry MRD analysis reveals no definitive immunophenotypic evidence of residual B lymphoblastic leukemia; BCR-ABL p210 transcripts were detected at a level of 0.002 IS % ratio in bone marrow.     # Hypogammaglobulimia  - 11/01/20 IgG 266 declined IVIG  - 03/11/21 IgG 148 - IVIG infusion stopped as patient developed rigors, tachycardia and HTN during infusion despite pre-medication   - 03/12/21 IgG 213 - completed IVIG with premedications and slower rate of infusion  -03/17/21 IgG 632     # Mild CKD  Estimated Creatinine Clearance: 70.6 mL/min (A) (based on SCr of 1.74 mg/dL (H)).     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 Juliene Pina felt chest pain is appropriate in setting of medically managed empyema and risk of thoracotomy outweighed benefit given clinical symptoms  - 03/05/21 MRSA pneumonia after influenza infection requiring ICU admission/intubation  - 12/11 CT Chest : Multifocal consolidative airspace disease, nonspecific small pleural effusion in the dependent aspect of the left pleural space and trace loculated collections within the lateral aspect of the right chest.  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 Continue doxycycline 100 mg bid  indefinitely  ?? Educated about  sun protection measures given the risk of phototoxicity, to take doxycycline in an upright position with a full glass of water and 60 mins before going to bed and stay upright for 30 mins after taking the pill to avoid pill esophagitis.    Recommended COVID bivalent booster and Influenza vaccinations    Follow up   6-12 months           Recommendations were communicated via shared medical record.    Attending attestation  I saw and evaluated the patient, participating in the key portions of the service.  I reviewed the resident???s note.  I agree with the resident???s findings and plan.   I personally spent 39 minutes face-to-face and non-face-to-face in the care of this patient, which includes all pre, intra, and post visit time on the date of service.  All documented time was specific to the E/M visit and does not include any procedures that may have been performed.  [name], MD    Subjective     Interval History:    I reviewed and summarized the medical records from hospitalization on 03/06/2021 - 03/20/2021 which illustrated that Adam Keith is a 41 y.o. 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 He greatly improved with antibiotics and was discharged on room air.    Today patient feels well, no subjective fevers , chills or rigors. Does not report ongoing productive cough or pleuritic chest pain. He gets SOB on exertion and reports ongoing fatigue. He is compliant with doxycycline but reports GERD symptoms.     I have educated him about pill esophagitis risk with doxycyline and to avoid this to take doxycycline upright with full glass of water and remain upright for 30-60 mins after.       Medications:  Antimicrobials: Doxycycline 100 mg bd  Prior/Current immunomodulators: Ponatinib 30 mg  Other medications reviewed.    Objective     Vital signs:  BP 139/89  - Pulse 95  - Temp 36.7 ??C (98 ??F) (Oral)  - Resp 16  - Ht 185 cm (6' 0.84)  - Wt (!) 104.3 kg (229 lb 14.4 oz)  - SpO2 99%  - BMI 30.47 kg/m??     Physical Exam:    Const [x]  vital signs above    [x]  NAD, non-toxic appearance []  Chronically ill-appearing, non-distressed        Eyes [x]  Lids normal bilaterally, conjunctiva anicteric and noninjected OU     [] PERRL  [] EOMI        ENMT [x]  Normal appearance of external nose and ears, no nasal discharge        [x]  MMM, no lesions on lips or gums [x]  No thrush, leukoplakia, oral lesions  []  Dentition good []  Edentulous []  Dental caries present  []  Hearing normal  []  TMs with good light reflexes bilaterally         Neck [x]  Neck of normal appearance and trachea midline        [x]  No thyromegaly, nodules, or tenderness   [x]  Full neck ROM        Lymph [x]  No LAD in neck     []  No LAD in supraclavicular area     []  No LAD in axillae   []  No LAD in epitrochlear chains     []   No LAD in inguinal areas        CV [x]  RRR            [x]  No peripheral edema     []  Pedal pulses intact   [x]  No abnormal heart sounds appreciated   []  Extremities WWP         Resp [x]  Normal WOB at rest    []  No breathlessness with speaking, no coughing  [x]  CTA anteriorly    [x]  CTA posteriorly          GI [x]  Normal inspection, NTND   []  NABS     []  No umbilical hernia on exam       []  No hepatosplenomegaly     []  Inspection of perineal and perianal areas normal        GU []  Normal external genitalia     [] No urinary catheter present in urethra   []  No CVA tenderness    []  No tenderness over renal allograft  deferred      MSK [x]  No clubbing or cyanosis of hands       [x]  No vertebral point tenderness  [x]  No focal tenderness or abnormalities on palpation of joints in RUE, LUE, RLE, or LLE        Skin [x]  No rashes, lesions, or ulcers of visualized skin     []  Skin warm and dry to palpation         Neuro [x]  Face expression symmetric  []  Sensation to light touch grossly intact throughout    [x]  Moves extremities equally    [x]  No tremor noted        []  CNs II-XII grossly intact     []  DTRs normal and symmetric throughout []  Gait unremarkable        Psych [x]  Appropriate affect       [x]  Fluent speech         []  Attentive, good eye contact  [x]  Oriented to person, place, time          []  Judgment and insight are appropriate             Labs:  Results in Past 30 Days  Result Component Current Result Ref Range Previous Result Ref Range   Absolute Eosinophils 0.8 (H) (04/12/2021) 0.0 - 0.5 10*9/L 0.3 (03/30/2021) 0.0 - 0.5 10*9/L   Absolute Lymphocytes 2.4 (04/12/2021) 1.1 - 3.6 10*9/L 2.3 (03/30/2021) 1.1 - 3.6 10*9/L   Absolute Neutrophils 7.0 (04/12/2021) 1.8 - 7.8 10*9/L 7.8 (03/30/2021) 1.8 - 7.8 10*9/L   Alkaline Phosphatase 117 (H) (04/12/2021) 46 - 116 U/L 135 (H) (03/30/2021) 46 - 116 U/L   ALT <7 (L) (04/12/2021) 10 - 49 U/L 16 (03/30/2021) 10 - 49 U/L   AST 13 (04/12/2021) <=34 U/L 11 (03/30/2021) <=34 U/L   BUN 14 (04/12/2021) 9 - 23 mg/dL 10 (16/01/9603) 9 - 23 mg/dL   Calcium 9.6 (08/06/979) 8.7 - 10.4 mg/dL 9.6 (19/14/7829) 8.7 - 10.4 mg/dL   Creatinine 5.62 (H) (04/12/2021) 0.60 - 1.10 mg/dL 1.30 (H) (86/57/8469) 6.29 - 1.10 mg/dL   HGB 52.8 (L) (07/04/3242) 12.9 - 16.5 g/dL 01.0 (L) (27/25/3664) 40.3 - 16.5 g/dL   Platelet 474 (05/09/9561) 150 - 450 10*9/L 415 (03/30/2021) 150 - 450 10*9/L   Potassium 4.4 (04/12/2021) 3.4 - 4.8 mmol/L 3.7 (03/30/2021) 3.4 - 4.8 mmol/L   Total Bilirubin 0.3 (04/12/2021) 0.3 - 1.2 mg/dL 0.4 (87/56/4332) 0.3 - 1.2 mg/dL   WBC 95.1 (11/09/4164) 3.6 - 11.2  10*9/L 11.4 (H) (03/30/2021) 3.6 - 11.2 10*9/L     I reviewed and noted the following labs: normal WBC count, mild anemia , improving serum creatinine .    Microbiology:  Past cultures were reviewed in Epic and CareEverywhere.  No new     Imaging:  No new

## 2021-04-24 NOTE — Unmapped (Signed)
Addended by: Darlen Round on: 04/23/2021 07:28 PM     Modules accepted: Level of Service

## 2021-04-26 ENCOUNTER — Ambulatory Visit: Admit: 2021-04-26 | Payer: MEDICAID

## 2021-04-26 DIAGNOSIS — G5620 Lesion of ulnar nerve, unspecified upper limb: Principal | ICD-10-CM

## 2021-04-26 MED ORDER — WRIST BRACE
Freq: Every day | 0 refills | 0.00000 days | Status: CP | PRN
Start: 2021-04-26 — End: ?

## 2021-04-27 ENCOUNTER — Ambulatory Visit: Admit: 2021-04-27 | Discharge: 2021-04-28 | Payer: MEDICAID | Attending: Adult Health | Primary: Adult Health

## 2021-04-27 DIAGNOSIS — C9100 Acute lymphoblastic leukemia not having achieved remission: Principal | ICD-10-CM

## 2021-04-27 LAB — COMPREHENSIVE METABOLIC PANEL
A/G RATIO: 2.8 — ABNORMAL HIGH (ref 1.2–2.2)
ALBUMIN: 4.4 g/dL (ref 4.0–5.0)
ALKALINE PHOSPHATASE: 116 IU/L (ref 44–121)
ALT (SGPT): 15 IU/L (ref 0–44)
AST (SGOT): 13 IU/L (ref 0–40)
BILIRUBIN TOTAL: 0.2 mg/dL (ref 0.0–1.2)
BLOOD UREA NITROGEN: 21 mg/dL (ref 6–24)
BUN / CREAT RATIO: 11 (ref 9–20)
CALCIUM: 9.7 mg/dL (ref 8.7–10.2)
CHLORIDE: 101 mmol/L (ref 96–106)
CO2: 20 mmol/L (ref 20–29)
CREATININE: 1.86 mg/dL — ABNORMAL HIGH (ref 0.76–1.27)
EGFR: 46 mL/min/{1.73_m2} — ABNORMAL LOW
GLOBULIN, TOTAL: 1.6 g/dL (ref 1.5–4.5)
GLUCOSE: 73 mg/dL (ref 70–99)
POTASSIUM: 4.3 mmol/L (ref 3.5–5.2)
SODIUM: 141 mmol/L (ref 134–144)
TOTAL PROTEIN: 6 g/dL (ref 6.0–8.5)

## 2021-04-27 LAB — CBC W/ DIFFERENTIAL
BANDED NEUTROPHILS ABSOLUTE COUNT: 0.1 10*3/uL (ref 0.0–0.1)
BASOPHILS ABSOLUTE COUNT: 0.2 10*3/uL (ref 0.0–0.2)
BASOPHILS RELATIVE PERCENT: 1 %
EOSINOPHILS ABSOLUTE COUNT: 1 10*3/uL — ABNORMAL HIGH (ref 0.0–0.4)
EOSINOPHILS RELATIVE PERCENT: 8 %
HEMATOCRIT: 38.6 % (ref 37.5–51.0)
HEMOGLOBIN: 13 g/dL (ref 13.0–17.7)
IMMATURE GRANULOCYTES: 1 %
LYMPHOCYTES ABSOLUTE COUNT: 3.6 10*3/uL — ABNORMAL HIGH (ref 0.7–3.1)
LYMPHOCYTES RELATIVE PERCENT: 29 %
MEAN CORPUSCULAR HEMOGLOBIN CONC: 33.7 g/dL (ref 31.5–35.7)
MEAN CORPUSCULAR HEMOGLOBIN: 29.1 pg (ref 26.6–33.0)
MEAN CORPUSCULAR VOLUME: 86 fL (ref 79–97)
MONOCYTES ABSOLUTE COUNT: 0.7 10*3/uL (ref 0.1–0.9)
MONOCYTES RELATIVE PERCENT: 6 %
NEUTROPHILS ABSOLUTE COUNT: 7.1 10*3/uL — ABNORMAL HIGH (ref 1.4–7.0)
NEUTROPHILS RELATIVE PERCENT: 55 %
PLATELET COUNT: 278 10*3/uL (ref 150–450)
RED BLOOD CELL COUNT: 4.47 x10E6/uL (ref 4.14–5.80)
RED CELL DISTRIBUTION WIDTH: 15.7 % — ABNORMAL HIGH (ref 11.6–15.4)
WHITE BLOOD CELL COUNT: 12.7 10*3/uL — ABNORMAL HIGH (ref 3.4–10.8)

## 2021-04-27 NOTE — Unmapped (Signed)
[facility]    Patient Name: Adam Keith  Patient Age: 41 y.o.  Encounter Date: 04/27/2021    Primary Care Provider:  Mick Sell, MD    Referring Physician:  Lenon Ahmadi, AGNP  [address]  [id number]  [place],  [place] [address]    Reason for visit:  Ph+ B-ALL follow up to consider timing of therapy change    Assessment:  A 41 y.o. 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 The course was complicated by septic shock, candida krusei fungemia, MRSA bacteremia with septic emboli c/b acute renal failure and respiratory distress requiring dialysis and intubation.  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  He subsequently had difficulty tolerating single agent dasatinib (as a bridge to planned ponatinib-blinatumomab--had fluid retention and pleural effusion).  He then completed  4 cycles of ponatinib-blinatumomab, initiated for treatment of MRD in the setting of poor tolerance of standard therapy. He is now MRD-negative with BCR-ABL level of 0.002% on marrow.    Patient is now on ponatinib 30mg  monotherapy.     He has been stable in the past month, with no new infections or hospitalizations. IVIG was attempted but stopped midway due to infusion reaction. He is open to trying another method to safely infuse IVIG but does not want to receive the full dose in a single outpatient day. I will discuss this with our pharmacy team.    Lingering symptoms:  - MRSA infections - long term doxycycline per ICID - they will follow q 6-12 months  - neuropathy - left ulnar nerve pain has improved s/p gabapentin change by Palliative Care team. Overall he feels his neuropathy in his arms is decreased  - loss of taste - has improved significantly  - CKD - encouraged him to keep working on oral hydration, particularly now that he is getting more active    Plan and Recommendations:  Ph+ B-ALL: CSF with rare blasts 12/6, now in CR (low level BCR-ABL) and no blasts   ?? Consider reducing dose of ponatinib from 30 mg to 15 mg if BCR-ABL is negative  ?? Continue aspirin while on ponatinib  -  BCR-ABL q 3 months on peripheral blood - next due in March    DRESS: followed by Dr. Caryn Section, dermatology  - rash currently resolved  - prednisone completed 9/12    Hx AKI-->CKD, due to sepsis. Now s/p dialysis  - stable Cr appears to have developed CKD due to incompletely healed acute injury.  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 [name] and Dr. Verlan Friends and Dr. Maple Mirza following patient.    - long term doxycycline with goal of decolonization  **Hx Candidemia during induction - now s/p Cresemab  - followed by Dr. [name]  **Hx Covid-19   - symptoms now resolved    Symptom management: bowel regimen as listed in medication list, analgesics as listed in medication list or symptoms reviewed and due to acceptable control, no changes made  **Peripheral neuropathy, sensory and motor  - Grade 1-2 monitor  **Chronic Back Pain, complicated by hospital stay and procedures  - Followed by Dr. Genice Rouge,  Palliative Care  **Depression/anxiety: met with [name], [facility]  - nortriptyline  - PRN clonazepam  - has not been using PRN hydroxyzine    Venous access: I recommend the following change for venous access : PICC line removal     Supportive Care Recommendations:  We recommend based on the patient???s underlying diagnosis and treatment history the following supportive care:      1. Antimicrobial prophylaxis:    - Viral - valacyclovir 500mg  daily  - Bacterial - levofloxacin 500 mg if ANC <0.5  - Fungal - on treatment isavuconazole  - PJP - Bactrim DS BID Sat/Sun  - recommend Annual flu shot - declines thus far  - recommend updated Covid vaccine - declines    2. Blood product support:  I recommend the following intervals for laboratory monitoring to determine transfusion needs and monitor for hematologic effects of therapy and underlying disease: no routine monitoring outside of clinic follow-up    Leukoreduced blood products are required.  Irradiated blood products are preferred, but in case of urgent transfusion needs non-irradiated blood products may be used:     -  RBC transfusion threshold: transfuse 2 units for Hgb < 8 g/dL.  -  Platelet transfusion threshold: transfuse 1 unit of platelets for platelet count < 10, or for bleeding or need for invasive procedure.    3. Hematopoietic growth factor support: none      Care coordination: The next [facility] Follow up requested:  - late March with Dr. Senaida Ores      These services include the following elements of medical decision making:  addressing a hematologic cancer that poses a threat to life and/or bone marrow function  interpretation of diagnostic test reports or ordering of diagnostic tests and independent interpretation of diagnostic tests  High risk of morbidity due to drug therapy requiring monitoring for toxicity or decisions regarding goals and continuation of antineoplastic therapy      ______________________________________________________________________    I personally spent 40 minutes face-to-face and non-face-to-face in the care of this patient, which includes all pre, intra, and post visit time on the date of service.    [name], AGPCNP-BC  Nurse Practitioner  Hematology/Oncology  [facility]    Leukemia Program  Division of Hematology/Oncology  [facility]    Nurse Navigator (non-clinical trial patients): Colletta Maryland, RN        Tel. [phone]       Fax. [phone]  Toll-free appointments: [phone]  Scheduling assistance: [phone]  After hours/weekends: [phone] (ask for adult hematology/oncology on-call)          History of Present Illness:  We had the pleasure of seeing Adam Keith in the Leukemia Clinic at the [facility] on 04/27/2021.  He is a 41 y.o. male with Ph+ B-ALL.          His oncologic history is as follows:      Oncology History Overview Note   Referring/Local Oncologist: None    Diagnosis:Ph+ ALL    Genetics:    Karyotype/FISH:Abnormal Karyotype: 46,XY,t(9;22)(q34;q11.2)[1]/45,XY,der(7;9)(q10;q10)t(9;22)(q34;q11.2),der(22)t(9;22)[11]/46,sdl,+der(22)t(9;22)[5]/46,XY[3]     Abnormal FISH: A BCR/ABL1 interphase FISH assay shows an abnormal signal pattern in 97% of the 100 cells scored. 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  The findings support a diagnosis of leukemia and have implications for targeted therapy and for monitoring residual disease.  Molecular Genetics:  BCR-ABL1 p210 transcripts were detected at a level of 46.479 IS% ratio in bone marrow.  BCR-ABL1 p190 transcripts were detected at a level of 4 in 100,000 cells in bone marrow.    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)     01/24/2020 - 04/02/2020 Chemotherapy    IP/OP LEUKEMIA GRAAPH-2005 + RITUXIMAB < 60 YO  rituximab hypercvad (odd and even course)     02/24/2020 Remission    CR with PCR MRD+; marrow showing 70% blasts with <1% blasts, negative flow MRD, BCR-ABL p210 PCR 0.197%; CNS negative for blasts     03/09/2020 Progression    CSF - rare blast ID'ed - IT chemo given    03/13/20 - IT chemo, CSF negative     04/16/2020 Biopsy    BM Bx with 40-50% cellularity, <1% blasts, MRD flow cytometry negative, BCR-ABL p210 transcripts 0.036%     05/28/2020 - 05/28/2020 Chemotherapy    OP AML - CNS THERAPY (INTRATHECAL CYTARABINE, INTRATHECAL METHOTREXATE, OR INTRATHECAL TRIPLE)  Select one of the following: cytarabine IT 100 mg with hydrocortisone 50 mg, cytarabine IT 40 mg with methotrexate 15 mg with hydrocortisone 50 mg, OR methotrexate IT 12 mg with hydrocortisone 50 mg     06/19/2020 -  Chemotherapy    IP/OP LEUKEMIA BLINATUMOMAB 7-DAY INFUSION (MINIMAL RESIDUAL DISEASE; WT >= 22 KG) (HOME INFUSION)      Cycles 1*-4: Blinatumomab 28 mcg/day Days 1-28 of 6-week cycle.  *Given in the inpatient setting on Days 1-3 on Cycle 1 and Days 1-2 on Cycle 2, while other treatment days are given in the outpatient setting.    Cycle 1 MRD Dosing     07/18/2020 Adverse Reaction    Hospitalization: fevers. Pneumonia     07/23/2020 Biopsy    Diagnosis  Bone marrow, right iliac, aspiration and biopsy  -   Normocellular bone marrow (50%) with trilineage hematopoiesis and 1% blasts by manual aspirate differential  -   Flow cytometry MRD analysis reveals no definitive immunophenotypic evidence of residual B lymphoblastic leukemia   - BCR-ABL p210 0.006%         08/10/2020 -  Chemotherapy    Cycle 2 blinatumomab-ponatinib  Ponatinib 30mg     1 IT per cycle     09/21/2020 Adverse Reaction    Covid-19, symptomatic but not requiring hospitalization.     10/06/2020 -  Chemotherapy    Cycle 3 blinatumomab-ponatinib  Ponatinib 30mg        11/01/2020 Adverse Reaction    Hospitalization: MRSA Pneumonia requiring intubation    Blinatumomab stopped.  Ponatinib held until he stabilized.       12/14/2020 -  Chemotherapy    Cycle 4 blinatumomab 28 mcg/day days 1-28, ponatinib 30 mg per day IT triple therapy day 29     01/13/2021 -  Chemotherapy    Ponatinib 30mg  daily  - due to due low level BCR-ABL     03/06/2021 Adverse Reaction    Hospitalization - ICU on ventilator - influenza and MRSA/klebsiella PNA     ALL (acute lymphoblastic leukemia) (CMS-HCC)   06/11/2020 -  Chemotherapy    IP/OP LEUKEMIA BLINATUMOMAB 7-DAY INFUSION (MINIMAL RESIDUAL DISEASE; WT >= 22 KG) (HOME INFUSION)  Cycles 1*-4: Blinatumomab 28 mcg/day Days 1-28 of 6-week cycle.  *  Given in the inpatient setting on Days 1-3 on Cycle 1 and Days 1-2 on Cycle 2, while other treatment days are given in the outpatient setting.     09/21/2020 Initial Diagnosis    ALL (acute lymphoblastic leukemia) (CMS-HCC)     Acute lymphoblastic leukemia in remission (CMS-HCC)   06/11/2020 -  Chemotherapy    IP/OP LEUKEMIA BLINATUMOMAB 7-DAY INFUSION (MINIMAL RESIDUAL DISEASE; WT >= 22 KG) (HOME INFUSION)  Cycles 1*-4: Blinatumomab 28 mcg/day Days 1-28 of 6-week cycle.  *Given in the inpatient setting on Days 1-3 on Cycle 1 and Days 1-2 on Cycle 2, while other treatment days are given in the outpatient setting.     11/09/2020 Initial Diagnosis    Acute lymphoblastic leukemia in remission (CMS-HCC)         Interim History:  Since last seen here reports that he has done fairly well.  No new infections or fevers.  He has good days with energy to work in his workshop and bad days. Maybe 50/50.  Stamina is limited  Breathing is easy, unless he exerts himself too much.  Nerve pain in his arms is improved. He is not being awakened due to pain and the ulnar pain in the left arm has decreased. He is following the pain regimen from the Palliative Care team.  Taste buds are improving significantly.  Requests no appointments in February - the month in which his son died      Past Medical, Surgical and Family History were reviewed and pertinent updates were made in the Electronic Medical Record      ECOG Performance Status: 1    Medications:    Current Outpatient Medications   Medication Sig Dispense Refill   ??? acetaminophen (TYLENOL) 325 MG tablet Take 650 mg by mouth every six (6) hours as needed for pain.      ??? amLODIPine (NORVASC) 10 MG tablet Take 1 tablet (10 mg total) by mouth daily. 30 tablet 0   ??? arm brace (WRIST BRACE) Misc 1 Piece by Miscellaneous route as needed. left ulnar wrist brace 1 each 0   ??? aspirin 81 MG chewable tablet Chew 1 tablet (81 mg total) daily. 36 tablet 11   ??? carvediloL (COREG) 12.5 MG tablet Take 1 tablet (12.5 mg total) by mouth Two (2) times a day. 180 tablet 1   ??? clonazePAM (KLONOPIN) 0.5 MG tablet Take 1 tablet (0.5 mg total) by mouth two (2) times a day as needed for anxiety or sleep. 60 tablet 1   ??? doxycycline (VIBRA-TABS) 100 MG tablet Take 1 tablet (100 mg total) by mouth Two (2) times a day. 180 tablet 3   ??? gabapentin (NEURONTIN) 300 MG capsule Take 1 capsule (300mg ) by mouth in the morning, 1 capsule (300mg ) in the afternoon, and 2 capsules (600mg ) at bedtime. May cause drowsiness. 120 capsule 5   ??? HYDROmorphone (DILAUDID) 2 MG tablet Take 1 tablet (2 mg total) by mouth two (2) times a day as needed. 56 tablet 0   ??? OXYCONTIN 15 mg 12 hr crush resistant ER/CR tablet Take 1 tablet (15 mg total) by mouth Two (2) times a day. 56 tablet 0   ??? PONATinib (ICLUSIG) 30 mg tablet Take 1 tablet (30 mg total) by mouth daily. Swallow tablets whole. Do not crush, break, cut or chew tablets. 30 tablet 5   ??? sulfamethoxazole-trimethoprim (BACTRIM DS) 800-160 mg per tablet Take 1 tablet (160 mg of trimethoprim total) by mouth 2 times a day  on Saturday, Sunday. For prophylaxis while on chemo. 48 tablet 3   ??? umeclidinium-vilanteroL (ANORO ELLIPTA) 62.5-25 mcg/actuation inhaler Inhale 1 puff daily. 60 each 11   ??? valACYclovir (VALTREX) 500 MG tablet Take 1 tablet (500 mg total) by mouth daily. 90 tablet 11   ??? albuterol HFA 90 mcg/actuation inhaler Inhale 2 puffs every six (6) hours as needed for wheezing. (Patient not taking: Reported on 04/27/2021) 8 g 11   ??? chlorhexidine (HIBICLENS) 4 % external liquid Apply topically daily. as directed (Patient not taking: Reported on 04/27/2021) 946 mL 0   ??? mupirocin (BACTROBAN) 2 % ointment Apply topically daily. apply in each nostril prior to bed, as directed (Patient not taking: Reported on 04/27/2021) 15 g 0     No current facility-administered medications for this visit.       Vital Signs:  Vitals:    04/27/21 1258   BP: 124/88   Pulse: 84   Resp: 15   Temp: 36.3 ??C (97.3 ??F)   SpO2: 100%       Relevant Physical Exam Findings:  General: Resting in no apparent distress, accompanied by spouse  HEENT:  Clear sclera, conjunctiva, mask in place  CARDAC: RRR, no R,M,Gs, no pitting peripheral edema  RESP: nonlabored, bilateral CTA  GI: Soft, nontender, active bowel sounds, no hepatic or splenomegaly  NEURO: alert, and oriented x4, steady gait, no focal deficits  PSYCH: appropriate  DERM: no visible rashes, lesions        Relevant Laboratory, radiology and pathology results:  I personally viewed the most recent internal records, laboratory results and hematopathology report  and discussed the available results with the patient and family  A summary of results follows:  Telephone on 04/23/2021   Component Date Value Ref Range Status   ??? Glucose 04/26/2021 73  70 - 99 mg/dL Final   ??? BUN 46/96/2952 21  6 - 24 mg/dL Final   ??? Creatinine 04/26/2021 1.86 (H)  0.76 - 1.27 mg/dL Final   ??? eGFR 04/26/2021 46 (L)  >59 mL/min/1.73 Final   ??? BUN/Creatinine Ratio 04/26/2021 11  9 - 20 Final   ??? Sodium 04/26/2021 141  134 - 144 mmol/L Final   ??? Potassium 04/26/2021 4.3  3.5 - 5.2 mmol/L Final   ??? Chloride 04/26/2021 101  96 - 106 mmol/L Final   ??? CO2 04/26/2021 20  20 - 29 mmol/L Final   ??? Calcium 04/26/2021 9.7  8.7 - 10.2 mg/dL Final   ??? Total Protein 04/26/2021 6.0  6.0 - 8.5 g/dL Final   ??? Albumin 84/13/2440 4.4  4.0 - 5.0 g/dL Final   ??? Globulin, Total 04/26/2021 1.6  1.5 - 4.5 g/dL Final   ??? A/G Ratio 01/29/2535 2.8 (H)  1.2 - 2.2 Final   ??? Total Bilirubin 04/26/2021 0.2  0.0 - 1.2 mg/dL Final   ??? Alkaline Phosphatase 04/26/2021 116  44 - 121 IU/L Final   ??? AST 04/26/2021 13  0 - 40 IU/L Final   ??? ALT 04/26/2021 15  0 - 44 IU/L Final   ??? WBC 04/26/2021 12.7 (H)  3.4 - 10.8 x10E3/uL Final   ??? RBC 04/26/2021 4.47  4.14 - 5.80 x10E6/uL Final   ??? HGB 04/26/2021 13.0  13.0 - 17.7 g/dL Final   ??? HCT 64/40/3474 38.6  37.5 - 51.0 % Final   ??? MCV 04/26/2021 86  79 - 97 fL Final   ??? MCH 04/26/2021 29.1  26.6 - 33.0 pg Final   ???  MCHC 04/26/2021 33.7  31.5 - 35.7 g/dL Final   ??? RDW 16/01/9603 15.7 (H)  11.6 - 15.4 % Final   ??? Platelet 04/26/2021 278  150 - 450 x10E3/uL Final   ??? Neutrophils % 04/26/2021 55  Not Estab. % Final   ??? Lymphocytes % 04/26/2021 29  Not Estab. % Final   ??? Monocytes % 04/26/2021 6  Not Estab. % Final   ??? Eosinophils % 04/26/2021 8  Not Estab. % Final   ??? Basophils % 04/26/2021 1  Not Estab. % Final   ??? Absolute Neutrophils 04/26/2021 7.1 (H)  1.4 - 7.0 x10E3/uL Final   ??? Absolute Lymphocytes 04/26/2021 3.6 (H)  0.7 - 3.1 x10E3/uL Final   ??? Absolute Monocytes  04/26/2021 0.7  0.1 - 0.9 x10E3/uL Final   ??? Absolute Eosinophils 04/26/2021 1.0 (H)  0.0 - 0.4 x10E3/uL Final   ??? Absolute Basophils  04/26/2021 0.2  0.0 - 0.2 x10E3/uL Final   ??? Immature Granulocytes 04/26/2021 1  Not Estab. % Final   ??? Bands Absolute 04/26/2021 0.1  0.0 - 0.1 x10E3/uL Final     Chest X-ray PA and lateral ??Increased interstitial edema versus atypical infection/inflammation.  ??  Small right pleural effusion.    Diagnosis   Date Value Ref Range Status   03/16/2021   Final    A. Lung, right upper lobe, bronchoalveolar lavage:  - No cytologically malignant cells identified  - Abundant neutrophils present    This electronic signature is attestation that the pathologist personally reviewed the submitted material(s) and the final diagnosis reflects that evaluation.       Diagnosis   Date Value Ref Range Status   03/16/2021   Final    A. Lung, right upper lobe, bronchoalveolar lavage:  - No cytologically malignant cells identified  - Abundant neutrophils present    This electronic signature is attestation that the pathologist personally reviewed the submitted material(s) and the final diagnosis reflects that evaluation.     01/11/2021   Final    A:  Cerebrospinal fluid, cytospin review and flow cytometry   -  No blasts identified by morphology or flow cytometric analysis    This electronic signature is attestation that the pathologist personally reviewed the submitted material(s) and the final diagnosis reflects that evaluation.     01/11/2021   Final    Bone marrow, left iliac, aspiration and biopsy  -  Normocellular bone marrow (30% overall) with trilineage hematopoiesis and less than 1% blasts by manual aspirate differential  -   Flow cytometry MRD analysis reveals no definitive immunophenotypic evidence of residual B lymphoblastic leukemia (see Comment)     -  See linked reports for associated Ancillary Studies.      This electronic signature is attestation that the pathologist personally reviewed the submitted material(s) and the final diagnosis reflects that evaluation.

## 2021-04-28 NOTE — Unmapped (Signed)
I am glad to see you doing well.  Continue the ponatinib daily.  I will discuss with our pharmacy team ways the IVIG could be administered with less risk of reactions.  I am rescheduling your next appointment to late March (not April). Please remind the lab to collect the BCR-ABL level.    Please contact us sooner with any questions/concerns.

## 2021-05-04 ENCOUNTER — Other Ambulatory Visit: Admit: 2021-05-04 | Discharge: 2021-05-05 | Payer: MEDICAID

## 2021-05-04 ENCOUNTER — Ambulatory Visit: Admit: 2021-05-04 | Discharge: 2021-05-05 | Payer: MEDICAID

## 2021-05-04 ENCOUNTER — Ambulatory Visit: Admit: 2021-05-04 | Discharge: 2021-05-05 | Payer: MEDICAID | Attending: Adult Health | Primary: Adult Health

## 2021-05-04 DIAGNOSIS — J15212 Pneumonia due to Methicillin resistant Staphylococcus aureus: Principal | ICD-10-CM

## 2021-05-04 DIAGNOSIS — R5383 Other fatigue: Principal | ICD-10-CM

## 2021-05-04 DIAGNOSIS — C9100 Acute lymphoblastic leukemia not having achieved remission: Principal | ICD-10-CM

## 2021-05-04 LAB — CBC W/ AUTO DIFF
BASOPHILS ABSOLUTE COUNT: 0.2 10*9/L — ABNORMAL HIGH (ref 0.0–0.1)
BASOPHILS RELATIVE PERCENT: 1.5 %
EOSINOPHILS ABSOLUTE COUNT: 1.5 10*9/L — ABNORMAL HIGH (ref 0.0–0.5)
EOSINOPHILS RELATIVE PERCENT: 13.5 %
HEMATOCRIT: 39.9 % (ref 39.0–48.0)
HEMOGLOBIN: 13.5 g/dL (ref 12.9–16.5)
LYMPHOCYTES ABSOLUTE COUNT: 2.4 10*9/L (ref 1.1–3.6)
LYMPHOCYTES RELATIVE PERCENT: 21.7 %
MEAN CORPUSCULAR HEMOGLOBIN CONC: 33.8 g/dL (ref 32.0–36.0)
MEAN CORPUSCULAR HEMOGLOBIN: 28.6 pg (ref 25.9–32.4)
MEAN CORPUSCULAR VOLUME: 84.5 fL (ref 77.6–95.7)
MEAN PLATELET VOLUME: 7.9 fL (ref 6.8–10.7)
MONOCYTES ABSOLUTE COUNT: 0.8 10*9/L (ref 0.3–0.8)
MONOCYTES RELATIVE PERCENT: 7.1 %
NEUTROPHILS ABSOLUTE COUNT: 6.1 10*9/L (ref 1.8–7.8)
NEUTROPHILS RELATIVE PERCENT: 56.2 %
PLATELET COUNT: 293 10*9/L (ref 150–450)
RED BLOOD CELL COUNT: 4.72 10*12/L (ref 4.26–5.60)
RED CELL DISTRIBUTION WIDTH: 16.3 % — ABNORMAL HIGH (ref 12.2–15.2)
WBC ADJUSTED: 10.9 10*9/L (ref 3.6–11.2)

## 2021-05-04 LAB — COMPREHENSIVE METABOLIC PANEL
ALBUMIN: 3.9 g/dL (ref 3.4–5.0)
ALKALINE PHOSPHATASE: 95 U/L (ref 46–116)
ALT (SGPT): 24 U/L (ref 10–49)
ANION GAP: 6 mmol/L (ref 5–14)
AST (SGOT): 21 U/L (ref <=34)
BILIRUBIN TOTAL: 0.3 mg/dL (ref 0.3–1.2)
BLOOD UREA NITROGEN: 15 mg/dL (ref 9–23)
BUN / CREAT RATIO: 9
CALCIUM: 9.6 mg/dL (ref 8.7–10.4)
CHLORIDE: 109 mmol/L — ABNORMAL HIGH (ref 98–107)
CO2: 25 mmol/L (ref 20.0–31.0)
CREATININE: 1.63 mg/dL — ABNORMAL HIGH
EGFR CKD-EPI (2021) MALE: 54 mL/min/{1.73_m2} — ABNORMAL LOW (ref >=60)
GLUCOSE RANDOM: 86 mg/dL (ref 70–179)
POTASSIUM: 4.3 mmol/L (ref 3.4–4.8)
PROTEIN TOTAL: 6.9 g/dL (ref 5.7–8.2)
SODIUM: 140 mmol/L (ref 135–145)

## 2021-05-04 LAB — TSH: THYROID STIMULATING HORMONE: 4.437 u[IU]/mL (ref 0.550–4.780)

## 2021-05-04 LAB — MAGNESIUM: MAGNESIUM: 1.8 mg/dL (ref 1.6–2.6)

## 2021-05-04 MED ORDER — OSELTAMIVIR 75 MG CAPSULE
ORAL_CAPSULE | Freq: Two times a day (BID) | ORAL | 0 refills | 5.00000 days | Status: CP
Start: 2021-05-04 — End: 2021-05-09

## 2021-05-04 MED ADMIN — sodium chloride 0.9% (NS) bolus 1,000 mL: 1000 mL | INTRAVENOUS | @ 20:00:00 | Stop: 2021-05-04

## 2021-05-04 NOTE — Unmapped (Signed)
Infusion Center Progress Note    Patient Name: Adam Keith  Patient Age: 41 y.o.  Encounter Date: 05/04/2021    Reason for visit  Chief Complaint: SOB, generalized muscle aches and pains     Assessment/Plan:   I have discussed the case (exam, laboratory results, and plan) with [name], NP. Patient and team are all in agreement with plan outlined below.    Fatigue, decreased home oxygen readings with history of severe MRSA infections to include PNA + intubations-  --CBC: stable from prior. WBC 10.9, Hgb 13.5, plt 293, ANC 6.1  --CMP: stable from prior, CKD with sCr 1.63  --Mag: 1.8  --TSH: 4.437  --NS IVF  --Respiratory pathogen panel with Covid 19 testing: positive influenza A  --CXR 2V: Improved right midlung airspace consolidation. Right pleural effusion versus pleural thickening  --pre walk O2 sat 96%, post walk O2 sat 100%. During walk pulse ox left on. Read 89% throughout. Adam Keith denied SOB.  --encourage smoking cessation  --not currently using Anoro Ellipta inhaler due to bad taste in mouth. Encourage restart and reach out to pulmonologist for alternatives   --RX oseltamivir 75mg  PO BID x 5 days    Sent notification to Dr. Senaida Ores so any other changes to POC can be addressed. Adam Keith is aware to monitor symptoms closely. He should present for immediate evaluation of increased SOB, persistent low oxygen reading at home, fever, or any other acute changes in his condition. He is aware of what to monitor and states he will pick up his tamiflu as soon as [facility] has available.       Subjective/HPI:  Adam Keith is a 41 y.o. 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     Adam Keith states he just isn't feeling great. He endorses feeling tired, not having the energy to work outside of the home all day. This is stressful for him, because he wants desperately to get back to work and his body is holding him back. He states he has been feeling more tired for the last several days. He denies fever, chills, sweating, nausea, vomiting, abdominal pain, constipation or diarrhea. He reports some shortness of breath on exertion, denies SOB at rest. He also has a mild, intermittent dry cough. It is not particularly bothersome to him at this point. He denies rhinorrhea or sore throat. He notes February is when his son tragically passed away in 09/26/2019, so it is emotionally a challenging month in his family.     Adam Keith states his wife checked his oxygen level yesterday and it was continuously reading 89-90%. He states he has oxygen at home after a previous hospital discharge, but has not needed to use it. He is taking his doxycycline, he is actively smoking, and he does not use his inhaler (Anoro) for his COPD. He states not using it is a pretty bad reason, it coats his mouth in a powder and he can't get the taste off. He is open to using it and discussing alternatives with his pulmonologist.     Review of Systems:  Review of Systems   Constitutional: Positive for fatigue. Negative for appetite change, chills, diaphoresis and fever.   HENT:   Negative for mouth sores and sore throat.    Respiratory: Positive for cough and shortness of breath. Negative for chest tightness and wheezing.         Mild intermittent, dry  cough. Not frequently enough to be bothersome. Some DOE, no SOB at rest.   Cardiovascular: Positive for leg swelling. Negative for chest pain and palpitations.        Some BLE edema, has been chronic. During day legs get slightly swollen, resolves overnight with laying down. Does not wear compression socks.   Gastrointestinal: Negative for abdominal pain, constipation, diarrhea, nausea and vomiting.   Genitourinary: Negative for difficulty urinating and frequency.    Musculoskeletal: Positive for back pain. Negative for gait problem.        Chronic, unchanged   Skin: Negative.    Neurological: Negative for dizziness, extremity weakness, gait problem and headaches.   Hematological: Negative.    Psychiatric/Behavioral: Positive for depression.        February is personally a hard month in the family emotionally       Oncology History:  Oncology History Overview Note   Referring/Local Oncologist: None    Diagnosis:Ph+ ALL    Genetics:    Karyotype/FISH:Abnormal Karyotype: 46,XY,t(9;22)(q34;q11.2)[1]/45,XY,der(7;9)(q10;q10)t(9;22)(q34;q11.2),der(22)t(9;22)[11]/46,sdl,+der(22)t(9;22)[5]/46,XY[3]     Abnormal FISH: A BCR/ABL1 interphase FISH assay shows an abnormal signal pattern in 97% of the 100 cells scored. 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  The findings support a diagnosis of leukemia and have implications for targeted therapy and for monitoring residual disease.      Molecular Genetics:  BCR-ABL1 p210 transcripts were detected at a level of 46.479 IS% ratio in bone marrow.  BCR-ABL1 p190 transcripts were detected at a level of 4 in 100,000 cells in bone marrow.    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)     01/24/2020 - 04/02/2020 Chemotherapy    IP/OP LEUKEMIA GRAAPH-2005 + RITUXIMAB < 60 YO  rituximab hypercvad (odd and even course)     02/24/2020 Remission    CR with PCR MRD+; marrow showing 70% blasts with <1% blasts, negative flow MRD, BCR-ABL p210 PCR 0.197%; CNS negative for blasts     03/09/2020 Progression    CSF - rare blast ID'ed - IT chemo given    03/13/20 - IT chemo, CSF negative     04/16/2020 Biopsy    BM Bx with 40-50% cellularity, <1% blasts, MRD flow cytometry negative, BCR-ABL p210 transcripts 0.036%     05/28/2020 - 05/28/2020 Chemotherapy OP AML - CNS THERAPY (INTRATHECAL CYTARABINE, INTRATHECAL METHOTREXATE, OR INTRATHECAL TRIPLE)  Select one of the following: cytarabine IT 100 mg with hydrocortisone 50 mg, cytarabine IT 40 mg with methotrexate 15 mg with hydrocortisone 50 mg, OR methotrexate IT 12 mg with hydrocortisone 50 mg     06/19/2020 -  Chemotherapy    IP/OP LEUKEMIA BLINATUMOMAB 7-DAY INFUSION (MINIMAL RESIDUAL DISEASE; WT >= 22 KG) (HOME INFUSION)      Cycles 1*-4: Blinatumomab 28 mcg/day Days 1-28 of 6-week cycle.  *Given in the inpatient setting on Days 1-3 on Cycle 1 and Days 1-2 on Cycle 2, while other treatment days are given in the outpatient setting.    Cycle 1 MRD Dosing     07/18/2020 Adverse Reaction    Hospitalization: fevers. Pneumonia     07/23/2020 Biopsy    Diagnosis  Bone marrow, right iliac, aspiration  and biopsy  -   Normocellular bone marrow (50%) with trilineage hematopoiesis and 1% blasts by manual aspirate differential  -   Flow cytometry MRD analysis reveals no definitive immunophenotypic evidence of residual B lymphoblastic leukemia   - BCR-ABL p210 0.006%         08/10/2020 -  Chemotherapy    Cycle 2 blinatumomab-ponatinib  Ponatinib 30mg     1 IT per cycle     09/21/2020 Adverse Reaction    Covid-19, symptomatic but not requiring hospitalization.     10/06/2020 -  Chemotherapy    Cycle 3 blinatumomab-ponatinib  Ponatinib 30mg        11/01/2020 Adverse Reaction    Hospitalization: MRSA Pneumonia requiring intubation    Blinatumomab stopped.  Ponatinib held until he stabilized.       12/14/2020 -  Chemotherapy    Cycle 4 blinatumomab 28 mcg/day days 1-28, ponatinib 30 mg per day IT triple therapy day 29     01/13/2021 -  Chemotherapy    Ponatinib 30mg  daily  - due to due low level BCR-ABL     03/06/2021 Adverse Reaction    Hospitalization - ICU on ventilator - influenza and MRSA/klebsiella PNA     ALL (acute lymphoblastic leukemia) (CMS-HCC)   06/11/2020 -  Chemotherapy    IP/OP LEUKEMIA BLINATUMOMAB 7-DAY INFUSION (MINIMAL RESIDUAL DISEASE; WT >= 22 KG) (HOME INFUSION)  Cycles 1*-4: Blinatumomab 28 mcg/day Days 1-28 of 6-week cycle.  *Given in the inpatient setting on Days 1-3 on Cycle 1 and Days 1-2 on Cycle 2, while other treatment days are given in the outpatient setting.     09/21/2020 Initial Diagnosis    ALL (acute lymphoblastic leukemia) (CMS-HCC)     Acute lymphoblastic leukemia in remission (CMS-HCC)   06/11/2020 -  Chemotherapy    IP/OP LEUKEMIA BLINATUMOMAB 7-DAY INFUSION (MINIMAL RESIDUAL DISEASE; WT >= 22 KG) (HOME INFUSION)  Cycles 1*-4: Blinatumomab 28 mcg/day Days 1-28 of 6-week cycle.  *Given in the inpatient setting on Days 1-3 on Cycle 1 and Days 1-2 on Cycle 2, while other treatment days are given in the outpatient setting.     11/09/2020 Initial Diagnosis    Acute lymphoblastic leukemia in remission (CMS-HCC)         Allergies:  Allergies   Allergen Reactions   ??? Bupropion Hcl Other (See Comments)     Per patient out of touch with reality, suicidal, homicidal   ??? Cefepime Rash     DRESS   ??? Ceftaroline Fosamil Rash and Other (See Comments)     Rash X 2 02/2020, suspected DRESS 06/2020   ??? 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 Onion Anaphylaxis   ??? Vancomycin Analogues      Hearing loss with Lasix  Other reaction(s): Other (See Comments)  Hearing loss  lasix   ??? Bismuth Subsalicylate Nausea And Vomiting   ??? Privigen [Immun Glob G(Igg)-Pro-Iga 0-50] Other (See Comments)     03/10/2021 VIG infusion stopped as patient developed rigors, tachycardia and HTN during infusion despite pre-medication; 03/12/21 completed IVIG with premedications and slower rate of infusion   ??? Furosemide      With Vancomycin caused hearing loss  Other reaction(s): Other (See Comments)  With Vancomycin caused hearing loss       Medications:  Reviewed in the EMR    Physical Exam:  There were no vitals taken for this visit.    Physical Exam  Vitals reviewed.  Constitutional:       General: He is awake. He is not in acute distress.     Appearance: Normal appearance. He is not ill-appearing.      Comments: Sitting up in bed, appears comfortable. Presents alone   Cardiovascular:      Rate and Rhythm: Normal rate and regular rhythm.      Heart sounds: Normal heart sounds. No murmur heard.    No friction rub. No gallop.   Pulmonary:      Effort: Pulmonary effort is normal.      Breath sounds: Examination of the right-lower field reveals decreased breath sounds. Decreased breath sounds present. No wheezing, rhonchi or rales.   Musculoskeletal:      Right lower leg: No edema.      Left lower leg: No edema.   Skin:     General: Skin is warm and dry.   Neurological:      General: No focal deficit present.      Mental Status: He is alert and oriented to person, place, and time.      Motor: Motor function is intact.      Gait: Gait is intact.   Psychiatric:         Attention and Perception: Attention normal.         Mood and Affect: Mood normal.         Speech: Speech normal.         Behavior: Behavior is cooperative.           Results:  No visits with results within 1 Day(s) from this visit.   Latest known visit with results is:   Telephone on 04/23/2021   Component Date Value Ref Range Status   ??? Glucose 04/26/2021 73  70 - 99 mg/dL Final   ??? BUN 21/30/8657 21  6 - 24 mg/dL Final   ??? Creatinine 04/26/2021 1.86 (H)  0.76 - 1.27 mg/dL Final   ??? eGFR 04/26/2021 46 (L)  >59 mL/min/1.73 Final   ??? BUN/Creatinine Ratio 04/26/2021 11  9 - 20 Final   ??? Sodium 04/26/2021 141  134 - 144 mmol/L Final   ??? Potassium 04/26/2021 4.3  3.5 - 5.2 mmol/L Final   ??? Chloride 04/26/2021 101  96 - 106 mmol/L Final   ??? CO2 04/26/2021 20  20 - 29 mmol/L Final   ??? Calcium 04/26/2021 9.7  8.7 - 10.2 mg/dL Final   ??? Total Protein 04/26/2021 6.0  6.0 - 8.5 g/dL Final   ??? Albumin 84/69/6295 4.4  4.0 - 5.0 g/dL Final   ??? Globulin, Total 04/26/2021 1.6  1.5 - 4.5 g/dL Final   ??? A/G Ratio 28/41/3244 2.8 (H)  1.2 - 2.2 Final   ??? Total Bilirubin 04/26/2021 0.2  0.0 - 1.2 mg/dL Final   ??? Alkaline Phosphatase 04/26/2021 116  44 - 121 IU/L Final   ??? AST 04/26/2021 13  0 - 40 IU/L Final   ??? ALT 04/26/2021 15  0 - 44 IU/L Final   ??? WBC 04/26/2021 12.7 (H)  3.4 - 10.8 x10E3/uL Final   ??? RBC 04/26/2021 4.47  4.14 - 5.80 x10E6/uL Final   ??? HGB 04/26/2021 13.0  13.0 - 17.7 g/dL Final   ??? HCT 04/06/7251 38.6  37.5 - 51.0 % Final   ??? MCV 04/26/2021 86  79 - 97 fL Final   ??? MCH 04/26/2021 29.1  26.6 - 33.0 pg Final   ??? MCHC 04/26/2021 33.7  31.5 -  35.7 g/dL Final   ??? RDW 16/01/9603 15.7 (H)  11.6 - 15.4 % Final   ??? Platelet 04/26/2021 278  150 - 450 x10E3/uL Final   ??? Neutrophils % 04/26/2021 55  Not Estab. % Final   ??? Lymphocytes % 04/26/2021 29  Not Estab. % Final   ??? Monocytes % 04/26/2021 6  Not Estab. % Final   ??? Eosinophils % 04/26/2021 8  Not Estab. % Final   ??? Basophils % 04/26/2021 1  Not Estab. % Final   ??? Absolute Neutrophils 04/26/2021 7.1 (H)  1.4 - 7.0 x10E3/uL Final   ??? Absolute Lymphocytes 04/26/2021 3.6 (H)  0.7 - 3.1 x10E3/uL Final   ??? Absolute Monocytes  04/26/2021 0.7  0.1 - 0.9 x10E3/uL Final   ??? Absolute Eosinophils 04/26/2021 1.0 (H)  0.0 - 0.4 x10E3/uL Final   ??? Absolute Basophils  04/26/2021 0.2  0.0 - 0.2 x10E3/uL Final   ??? Immature Granulocytes 04/26/2021 1  Not Estab. % Final   ??? Bands Absolute 04/26/2021 0.1  0.0 - 0.1 x10E3/uL Final       I spent a total of 30 minutes in the care of this patient.     Lavonda Jumbo, AGNP-BC  [facility]

## 2021-05-04 NOTE — Unmapped (Signed)
PIV placed.  Labs drawn & sent for analysis. To next appt.  Care provided by Daniel Maxwell RN.

## 2021-05-05 NOTE — Unmapped (Signed)
Patient came to infusion as an Emergent visit and was seen by the APP. He was given 1L IVF as ordered and was given a Viral swab to rule out infection. His labs resulted WNP. The swab resulted negative for CoVid. The APP referred him for a chest CT. His PIV was removed. He declined an AVS  and was discharged.

## 2021-05-06 NOTE — Unmapped (Signed)
Called pt to set up time/date for CT, LM for call back .

## 2021-05-11 NOTE — Unmapped (Signed)
[facility] Refill Coordination Note    Specialty Medication(s) to be Shipped:   Hematology/Oncology: Iclusig    Other medication(s) to be shipped: No additional medications requested for fill at this time     Adam Keith, DOB: [birth year]  Phone: [phone] (home)       All above HIPAA information was verified with patient.     Was a Nurse, learning disability used for this call? No    Completed refill call assessment today to schedule patient's medication shipment from the [facility] [phone]).  All relevant notes have been reviewed.     Specialty medication(s) and dose(s) confirmed: Regimen is correct and unchanged.   Changes to medications: Adam Keith reports no changes at this time.  Changes to insurance: No  New side effects reported not previously addressed with a pharmacist or physician: None reported  Questions for the pharmacist: No    Confirmed patient received a Conservation officer, historic buildings and a Surveyor, mining with first shipment. The patient will receive a drug information handout for each medication shipped and additional FDA Medication Guides as required.       DISEASE/MEDICATION-SPECIFIC INFORMATION        N/A    SPECIALTY MEDICATION ADHERENCE     Medication Adherence    Patient reported X missed doses in the last month: 0  Specialty Medication: Iclusig 30 mg  Patient is on additional specialty medications: No  Informant: patient              Were doses missed due to medication being on hold? No    Iclusig 30 mg: 10 days of medicine on hand       REFERRAL TO PHARMACIST     Referral to the pharmacist: Not needed      [facility]     Shipping address confirmed in Epic.     Delivery Scheduled: Yes, Expected medication delivery date: 05/18/21.     Medication will be delivered via UPS to the prescription address in Epic [place].    Adam Keith   [facility] Specialty Technician

## 2021-05-14 ENCOUNTER — Telehealth: Admit: 2021-05-14 | Discharge: 2021-05-15 | Payer: MEDICAID

## 2021-05-14 DIAGNOSIS — F3342 Major depressive disorder, recurrent, in full remission: Principal | ICD-10-CM

## 2021-05-14 DIAGNOSIS — F419 Anxiety disorder, unspecified: Principal | ICD-10-CM

## 2021-05-14 NOTE — Unmapped (Signed)
[facility]  Psychiatry   Established Patient E&M Service - Outpatient       Assessment:    Adam Keith presents for follow-up evaluation. He states that his anxiety and depression is well-managed on the nortriptyline 100 mg at bedtime. His insomnia is well-managed with clonazepam 0.5 mg at bedtime. No medication changes needed. Will reassess on 08/13/21.    Identifying Information:  Adam Keith is a 41 y.o. male with a history of PH+ B cell ALL, major depressive disorder, PTSD, anxiety, and insomnia.??He has a long struggle with intermittent depressive episodes with one suicide attempt at age 89.??    Risk Assessment:  An assessment of suicide and violence risk factors was performed as part of this evaluation and is not significantly changed from the last visit.   While future psychiatric events cannot be accurately predicted, the patient does not currently require acute inpatient psychiatric care and does not currently meet [facility] involuntary commitment criteria.      Plan:    Problem 1:??Major Depressive Disorder  Status of problem: chronic and stable  Interventions:??  ??? Continue??nortriptyline??100??mg??at bedtime.   ??? Monitor nortriptyline level given elevated creatinine levels.??Nortriptyline level drawn on 12/21/20 was 66 (Ref range 70-170).????    Problem 2: Anxiety  Status of problem: chronic and stable  Interventions: see MDD above    Problem 3: Insomnia  Status of problem:  chronic and stable  Interventions:   ?? Continue clonazepam 0.5 mg at bedtime. Last filled on 04/19/21 per PDMP review.      Psychotherapy provided:  No billable psychotherapy service provided.    Patient has been given this writer's contact information as well as the [facility] Psychiat[facility] urgent line number. The patient has been instructed to call 911 for emergencies.      Subjective:    Chief complaint:  Follow-up psychiatric evaluation for depression and anxiety    Interval History:   States that he is no longer experiencing depressive or anxiety symptoms. Re-started the nortriptyline after completing linezolid after hospitalization in 12/22. Patient looking forward to the weeks ahead. Just visited [place] to get out of the house since he lost his son at this time 2 years ago. Considering going to the [place] to pursue therapy for his PTSD. Tolerating the nortriptyline well with no adverse effects. Clonazepam 0.5 mg is managing his insomnia well.     Prior Psychiatric Medication Trials: bupropion (could not tolerate), duloxetine, mirtazapine (given during recent hospitalization), lorazepam    Psychiatric Review of Systems  ??? Depressive Symptoms: denies  ??? Manic Symptoms: N/A  ??? Psychosis Symptoms: N/A  ??? Anxiety Symptoms: denies   ??? Panic Symptoms: denies  ??? PTSD Symptoms: no recent issues  ??? Sleep disturbance: denies   ??? Exercise: Unable to engage in a daily exercise routine.        Objective:      Mental Status Exam:  Appearance:    Appears stated age, Well nourished and Clean/Neat   Motor:   No abnormal movements   Speech/Language:    Normal rate, volume, tone, fluency   Mood:   I'm doing good   Affect:   Calm, Cooperative and Euthymic   Thought process and Associations:   Logical, linear, clear, coherent, goal directed   Abnormal/psychotic thought content:     Denies SI, HI, self harm, delusions, obsessions, paranoid ideation, or ideas of reference   Perceptual disturbances:     Denies auditory and visual hallucinations, behavior not concerning for  response to internal stimuli     Other:   insight and judgment intact     I personally spent 35 minutes face-to-face and non-face-to-face in the care of this patient, which includes all pre, intra, and post visit time on the date of service.    Visit was completed by video (or phone) and the appropriate disclaimer has been included below.    The patient reports they are currently: at home. I spent 20 minutes on the real-time audio and video with the patient on the date of service. I spent an additional 15 minutes on pre- and post-visit activities on the date of service.     The patient was physically located in [place] or a state in which I am permitted to provide care. The patient and/or parent/guardian understood that s/he may incur co-pays and cost sharing, and agreed to the telemedicine visit. The visit was reasonable and appropriate under the circumstances given the patient's presentation at the time.    The patient and/or parent/guardian has been advised of the potential risks and limitations of this mode of treatment (including, but not limited to, the absence of in-person examination) and has agreed to be treated using telemedicine. The patient's/patient's family's questions regarding telemedicine have been answered.     If the visit was completed in an ambulatory setting, the patient and/or parent/guardian has also been advised to contact their provider???s office for worsening conditions, and seek emergency medical treatment and/or call 911 if the patient deems either necessary.      Sheran Lawless, PMHNP  05/14/2021

## 2021-05-14 NOTE — Unmapped (Signed)
Comprehensive Cancer Support Program (CCSP) - Psychiatry Outpatient Clinic   After Visit Summary    It was a pleasure to see you today in the [facility]???s Comprehensive Cancer Support Program (CCSP). The CCSP is a multidisciplinary program dedicated to helping patients, caregivers, and families with cancer treatment, recovery and survivorship.      To schedule, cancel, or change your appointment:  Please call the [facility] schedulers at [phone], Monday through Friday 8AM - 5PM.  Someone will return your call within 24 hours.      If you have a question about your medicines or you need to contact your provider:  Please call the CCSP program coordinator, Myrene Galas, at [phone].     For after hours urgent issues, you may call [phone] or call the I need to talk line at [phone] anytime 24/7.    CCSP Patient and Family Resource Center: [phone].    CCSP Website:  [email or web address]    For prescription refills, please allow at least 24 hours (during business hours, M-F) for providers to call in refills to your pharmacy. We are generally unable to accommodate same-day requests for refills.     If you are taking any controlled substances (such as anxiety or sleep medications), you must use them as the directions say to use them. We generally do not provide early refills over the phone without clear reason, and it would be inappropriate to obtain the medications from other doctors. We routinely use the [place] controlled substance database to monitor prescription drug use.

## 2021-05-17 ENCOUNTER — Ambulatory Visit: Admit: 2021-05-17 | Discharge: 2021-05-18 | Payer: MEDICAID

## 2021-05-17 MED FILL — ICLUSIG 30 MG TABLET: ORAL | 30 days supply | Qty: 30 | Fill #5

## 2021-05-24 ENCOUNTER — Ambulatory Visit: Admit: 2021-05-24 | Discharge: 2021-05-24 | Payer: MEDICAID

## 2021-05-24 DIAGNOSIS — D721 Eosinophilia, unspecified type: Principal | ICD-10-CM

## 2021-05-24 LAB — CBC W/ AUTO DIFF
BASOPHILS ABSOLUTE COUNT: 0.1 10*9/L (ref 0.0–0.1)
BASOPHILS RELATIVE PERCENT: 0.7 %
EOSINOPHILS ABSOLUTE COUNT: 0.6 10*9/L — ABNORMAL HIGH (ref 0.0–0.5)
EOSINOPHILS RELATIVE PERCENT: 4.9 %
HEMATOCRIT: 41.1 % (ref 39.0–48.0)
HEMOGLOBIN: 13.6 g/dL (ref 12.9–16.5)
LYMPHOCYTES ABSOLUTE COUNT: 3 10*9/L (ref 1.1–3.6)
LYMPHOCYTES RELATIVE PERCENT: 25.1 %
MEAN CORPUSCULAR HEMOGLOBIN CONC: 33 g/dL (ref 32.0–36.0)
MEAN CORPUSCULAR HEMOGLOBIN: 28.2 pg (ref 25.9–32.4)
MEAN CORPUSCULAR VOLUME: 85.4 fL (ref 77.6–95.7)
MEAN PLATELET VOLUME: 7.7 fL (ref 6.8–10.7)
MONOCYTES ABSOLUTE COUNT: 0.9 10*9/L — ABNORMAL HIGH (ref 0.3–0.8)
MONOCYTES RELATIVE PERCENT: 7.6 %
NEUTROPHILS ABSOLUTE COUNT: 7.5 10*9/L (ref 1.8–7.8)
NEUTROPHILS RELATIVE PERCENT: 61.7 %
NUCLEATED RED BLOOD CELLS: 1 /100{WBCs} (ref <=4)
PLATELET COUNT: 292 10*9/L (ref 150–450)
RED BLOOD CELL COUNT: 4.81 10*12/L (ref 4.26–5.60)
RED CELL DISTRIBUTION WIDTH: 16.1 % — ABNORMAL HIGH (ref 12.2–15.2)
WBC ADJUSTED: 12.1 10*9/L — ABNORMAL HIGH (ref 3.6–11.2)

## 2021-05-24 NOTE — Unmapped (Signed)
Pulmonary Clinic - Follow-up Visit    Referring Physician :  Park Breed  PCP:     Mick Sell, MD  Reason for Consult:   DOE      ASSESSMENT and PLAN     [name] is a 41 y.o. 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    COPD 2B:  - Continue LABA/LAMA with Anoro - reviewed technique. Held on step up to ICS given ongoing infection.  - Repeat PFTs in 3 months  - Exacerbations: none so far  - Pulmonary Rehab:declines  - Lung Cancer Screening:not eligible  - Vaccines:declines  - Smoking cessation: Strongly encouraged but he declines.   - Hypercarbia/NIPPV screen: Declines currentlly   - Advanced therapies? Not indicated by PFTs  - START airway clearance with flutter valve to improve airway clearance. Consider nebs in the future.    Eosinophilia: Up to 1.5  - CBC w diff today  - Will discuss with heme    MRSA Empyema:  - Resolved on most recent CT, but improving RUL MRSA pna  - Cont doxy suppression as per ICID    Tobacco Use: ~ 2ppd  - Cessation encouraged      Vaccines:  Immunization History   Administered Date(s) Administered Comments   ??? COVID-19 VACCINE,MRNA(MODERNA)(PF) 02/25/2020    ???  03/23/2020    Deferred Date(s) Deferred Comments   ??? COVID-19 VACC,MRNA,(PFIZER)(PF) 01/24/2020    ??? Influenza Vaccine Quad (IIV4 PF) 50mo+ injectable 04/08/2020    ???  03/20/2021            Diagnoses and all orders for this visit:    Eosinophilia, unspecified type  -     Cancel: CBC w/ Differential  -     CBC w/ Differential; Future    Other orders  -     mucus clearing device Devi; 1 each by Miscellaneous route Two (2) times a day.        Plan of care was discussed with the patient who acknowledged understanding and is in agreement.    Patient will return to clinic in 3 months or sooner if needed.    This patient was seen and discussed with attending physician, Dr. Etta Quill who agrees with the assessment and plan above.     ZO:XWRU Minna Antis, Mick Sell, MD    Burman Riis Phinehas Grounds MD  Pulmonary and Critical Care Fellow  05/25/21   ~~~~~    HISTORY:     History of Present Illness:  [name] is a 41 y.o. male with a history of the above whom we are seeing in consultation requested by Park Breed for evaluation of DOE.     Patient developed DOE after his cancer diagnosis and chemotherapy. Trouble breathing since MRSA infection. Not SOB, more DOE. Takes shallower breaths due to pain in his right side. It takes him longer to recover. 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 Now he can't do a flight of stairs without stopping.    Currently smokes 2ppd and has done so for 10-20 years. Not interested in quitting for everyone else's sake.    Hearing loss, wife assists with meds and history.  Discussed therapies and implications of smoking. Recommended an inhaler as well as close follow-up for progression.     Interval 05/24/21:  - Admitted 03/06/21 for MRSA ONA after flu ICU stay with intubation.  - Using inhaler intermittently due to taste and powdery leftovers.   - Went a week without Anoro, and he felt more wheezy and SOB even between rooms. Wheezing was a big concern even at night.   - Not intersted in smoking cessation at this time, it feels like a nice thing for him when he's hurting and down in his mood.   - Discussed airway clearance      Past Medical History:  Past Medical History:   Diagnosis Date   ??? 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 BONE MARROW BIOPSY & ASPIRATION  01/21/2020        ??? CHG [place], CHEST,REAL TIME  11/20/2020    Procedure: ULTRASOUND, CHEST, REAL TIME WITH IMAGE DOCUMENTATION;  Surgeon: Jerelyn Charles, MD;  Location: [facility] PROCED[facility];  Service: Pulmonary   ??? IR INSERT PORT AGE GREATER THAN 5 YRS  03/10/2020    IR INSERT PORT AGE GREATER THAN 5 YRS 03/10/2020 [name], MD IMG VIR [facility]   ??? PR BRONCHOSCOPY,DIAGNOSTIC W LAVAGE Bilateral 07/20/2020    Procedure: BRONCHOSCOPY, RIGID OR FLEXIBLE, INCLUDE FLUOROSCOPIC GUIDANCE WHEN PERFORMED; W/BRONCHIAL ALVEOLAR LAVAGE WITH MODERATE SEDATION;  Surgeon: Dellis Filbert, MD;  Location: [facility]ON[facility] PROCEDURE LAB [facility];  Service: Pulmonary       Other History:  The social history and family history were personally reviewed and updated in the patient's electronic medical record.    Family History   Problem Relation Age of Onset   ??? Melanoma Neg Hx    ??? Basal cell carcinoma Neg Hx    ??? Squamous cell carcinoma Neg Hx      Social History     Socioeconomic History   ??? Marital status: Married   Tobacco Use   ??? Smoking status: Every Day     Packs/day: 2.00     Types: Cigarettes     Last attempt to quit: 01/20/2020     Years since quitting: 1.3   ??? Smokeless tobacco: Former     Types: Chew   Vaping Use   ??? Vaping Use: Never used   Substance and Sexual Activity   ??? Alcohol use: Not Currently   Other Topics Concern   ??? Do you use sunscreen? No   ??? Tanning bed use? No   ??? Are you easily burned? Yes   ??? Excessive sun exposure? No   ??? Blistering sunburns? Yes     Social Determinants of Health     Financial Resource Strain: Low Risk    ??? Difficulty of Paying Living Expenses: Not very hard   Food Insecurity: No Food Insecurity   ??? Worried About Running Out of Food in the Last Year: Never true   ??? Ran Out of Food in the Last Year: Never true   Transportation Needs: No Transportation Needs   ??? Lack of Transportation (Medical): No   ??? Lack of Transportation (Non-Medical): No       Home Medications:  Current Outpatient Medications on File Prior to Visit   Medication Sig Dispense Refill   ??? amLODIPine (NORVASC) 10 MG tablet Take 1 tablet (10 mg total) by mouth daily. 30 tablet 0   ???  aspirin 81 MG chewable tablet Chew 1 tablet (81 mg total) daily. 36 tablet 11   ??? carvediloL (COREG) 12.5 MG tablet Take 1 tablet (12.5 mg total) by mouth Two (2) times a day. 180 tablet 1   ??? clonazePAM (KLONOPIN) 0.5 MG tablet Take 1 tablet (0.5 mg total) by mouth two (2) times a day as needed for anxiety or sleep. 60 tablet 1   ??? doxycycline (VIBRA-TABS) 100 MG tablet Take 1 tablet (100 mg total) by mouth Two (2) times a day. 180 tablet 3   ??? gabapentin (NEURONTIN) 300 MG capsule Take 1 capsule (300mg ) by mouth in the morning, 1 capsule (300mg ) in the afternoon, and 2 capsules (600mg ) at bedtime. May cause drowsiness. 120 capsule 5   ??? HYDROmorphone (DILAUDID) 2 MG tablet Take 1 tablet (2 mg total) by mouth two (2) times a day as needed. 56 tablet 0   ??? nortriptyline (PAMELOR) 50 MG capsule Take 100 mg by mouth nightly.     ??? OXYCONTIN 15 mg 12 hr crush resistant ER/CR tablet Take 1 tablet (15 mg total) by mouth Two (2) times a day. 56 tablet 0   ??? PONATinib (ICLUSIG) 30 mg tablet Take 1 tablet (30 mg total) by mouth daily. Swallow tablets whole. Do not crush, break, cut or chew tablets. 30 tablet 5   ??? sulfamethoxazole-trimethoprim (BACTRIM DS) 800-160 mg per tablet Take 1 tablet (160 mg of trimethoprim total) by mouth 2 times a day on Saturday, Sunday. For prophylaxis while on chemo. 48 tablet 3   ??? umeclidinium-vilanteroL (ANORO ELLIPTA) 62.5-25 mcg/actuation inhaler Inhale 1 puff daily. 60 each 11   ??? valACYclovir (VALTREX) 500 MG tablet Take 1 tablet (500 mg total) by mouth daily. 90 tablet 11   ??? acetaminophen (TYLENOL) 325 MG tablet Take 650 mg by mouth every six (6) hours as needed for pain.      ??? albuterol HFA 90 mcg/actuation inhaler Inhale 2 puffs every six (6) hours as needed for wheezing. (Patient not taking: Reported on 04/27/2021) 8 g 11   ??? arm brace (WRIST BRACE) Misc 1 Piece by Miscellaneous route as needed. left ulnar wrist brace 1 each 0 ??? chlorhexidine (HIBICLENS) 4 % external liquid Apply topically daily. as directed (Patient not taking: Reported on 04/27/2021) 946 mL 0   ??? mupirocin (BACTROBAN) 2 % ointment Apply topically daily. apply in each nostril prior to bed, as directed (Patient not taking: Reported on 04/27/2021) 15 g 0   ??? [EXPIRED] oseltamivir (TAMIFLU) 75 MG capsule Take 1 capsule (75 mg total) by mouth Two (2) times a day for 5 days. 10 capsule 0     No current facility-administered medications on file prior to visit.       Allergies:  Allergies as of 05/24/2021 - Reviewed 05/24/2021   Allergen Reaction Noted   ??? Bupropion hcl Other (See Comments) 12/23/2019   ??? Cefepime Rash 11/04/2020   ??? Ceftaroline fosamil Rash and Other (See Comments) 03/03/2020   ??? Dapsone Other (See Comments) and Anaphylaxis 02/29/2020   ??? Onion Anaphylaxis 01/26/2020   ??? Vancomycin analogues  03/16/2020   ??? Bismuth subsalicylate Nausea And Vomiting 08/07/2013   ??? Privigen [immun glob g(igg)-pro-iga 0-50] Other (See Comments) 03/26/2021   ??? Furosemide  04/13/2020       Review of Systems:  A comprehensive review of systems was completed and negative except as noted in HPI.    PHYSICAL EXAM:   BP 124/92  - Pulse 88  -  Temp 36.7 ??C (98.1 ??F) (Temporal)  - Wt (!) 107.4 kg (236 lb 12.8 oz)  - SpO2 96%  - BMI 31.38 kg/m??   GEN: NAD, sitting in chair, friendly  EYES: EOMI, sclera anicteric  ENT: Trachea midline, MMM  CV: RRR, no murmurs appreciated  PULM: CTA B, normal WoB, no stridor, improved air movement, no wheeze  ABD: soft, non-tender, non-distended  EXT: No edema  NEURO: Grossly Non-focal, moving all extremities normally  PSYCH: A+Ox3, appropriate  MSK: no obvious joint deformities of b/l hands      LABORATORY and RADIOLOGY DATA:     Pulmonary Function Tests/Interpretation:      01/25/21 - My interp: moderate obstructive lung disease without significant bronchodilator response. Severely reduced DLCO.       Pertinent Laboratory Data:    Personally reviewed in EMR    Pertinent Imaging Data:  Personally reviewed in EMR    CT Chest 8/17 to 9/26 improvement

## 2021-05-24 NOTE — Unmapped (Addendum)
TO DO:  - Get a mucous clearance device - Acapella.   [email or web address]  - Do 10-15 sets of a full breath of the flutter valve alternating with a huff cough 1-2x per day, especially right before your Anoro.  - The biggest thing you can do for your breathing is stopping smoking.   Use Aerobika device for airway clearance.  Take a deep breath and breathe out steadily.  10 deep breaths followed by a huff cough = 1 set.  Do 2-3 sets twice a day for airway clearance.   Resistance dial is on the end - want some resistance but not so much that you get lightheaded at the end of a set.  Exercise can substitute for one session of airway clearance.

## 2021-06-02 DIAGNOSIS — C9101 Acute lymphoblastic leukemia, in remission: Principal | ICD-10-CM

## 2021-06-02 DIAGNOSIS — C9100 Acute lymphoblastic leukemia not having achieved remission: Principal | ICD-10-CM

## 2021-06-02 NOTE — Unmapped (Signed)
Called patient but no answer.  Wife did pick up so asked her about husband's doxycycline and any rashes.  She reports a rash on left hand that is painful.  Initially reported to provider as sunburn but it has persisted for over a month.  Have let [name], NP know.  Lab orders in place for repeat CBC and CMP--wife verbalized understanding and will go to [employer].

## 2021-06-02 NOTE — Unmapped (Signed)
[facility] orders entered

## 2021-06-03 ENCOUNTER — Telehealth: Admit: 2021-06-03 | Discharge: 2021-06-04 | Payer: MEDICAID

## 2021-06-03 ENCOUNTER — Telehealth
Admit: 2021-06-03 | Discharge: 2021-06-03 | Payer: MEDICAID | Attending: Student in an Organized Health Care Education/Training Program | Primary: Student in an Organized Health Care Education/Training Program

## 2021-06-03 DIAGNOSIS — G4700 Insomnia, unspecified: Principal | ICD-10-CM

## 2021-06-03 DIAGNOSIS — F4310 Post-traumatic stress disorder, unspecified: Principal | ICD-10-CM

## 2021-06-03 DIAGNOSIS — C9100 Acute lymphoblastic leukemia not having achieved remission: Principal | ICD-10-CM

## 2021-06-03 DIAGNOSIS — F419 Anxiety disorder, unspecified: Principal | ICD-10-CM

## 2021-06-03 DIAGNOSIS — F332 Major depressive disorder, recurrent severe without psychotic features: Principal | ICD-10-CM

## 2021-06-03 DIAGNOSIS — Z515 Encounter for palliative care: Principal | ICD-10-CM

## 2021-06-03 DIAGNOSIS — R2241 Localized swelling, mass and lump, right lower limb: Principal | ICD-10-CM

## 2021-06-03 DIAGNOSIS — C9101 Acute lymphoblastic leukemia, in remission: Principal | ICD-10-CM

## 2021-06-03 MED ORDER — GABAPENTIN 300 MG CAPSULE
ORAL_CAPSULE | 5 refills | 0 days | Status: CP
Start: 2021-06-03 — End: ?

## 2021-06-03 MED ORDER — HYDROMORPHONE 2 MG TABLET
ORAL_TABLET | Freq: Two times a day (BID) | ORAL | 0 refills | 28 days | Status: CP | PRN
Start: 2021-06-03 — End: 2022-06-03

## 2021-06-03 MED ORDER — OXYCONTIN 15 MG TABLET,CRUSH RESISTANT,EXTENDED RELEASE
ORAL_TABLET | Freq: Two times a day (BID) | ORAL | 0 refills | 28 days | Status: CP
Start: 2021-06-03 — End: ?

## 2021-06-03 MED ORDER — MIRTAZAPINE 15 MG TABLET
ORAL_TABLET | Freq: Every evening | ORAL | 11 refills | 30 days | Status: CP
Start: 2021-06-03 — End: 2022-06-03

## 2021-06-03 NOTE — Unmapped (Signed)
[facility]  Psychiatry   Established Patient E&M Service - Outpatient       Assessment:    Adam Keith presents for follow-up evaluation. He states that in the past few weeks, he started having pain in his leg that has interfered with his sleep and mood. He recognizes that his depression is now severe and that he needs to make some changes. He re-engaged with his therapist last week and meets with her again today (he had stopped therapy over the past year). He has been taking his medications as prescribed and is frustrated that his current medication regimen is no longer working well for him. Augmenting his nortriptyline with mirtazapine 15 mg at bedtime. Continuing his nortriptyline at 100 mg for now and clonazepam 0.5 mg at bedtime. Will reassess on 06/28/21.     Identifying Information:  Adam Keith is a 41 y.o. male with a history of PH+ B cell ALL, recurrent major depressive disorder, PTSD, anxiety, and insomnia.??He has had a long struggle with intermittent depressive episodes with one suicide attempt at age 70.??    Risk Assessment:  A suicide and violence risk assessment was performed as part of this evaluation. There patient is deemed to be at chronic elevated risk for self-harm/suicide given the following factors: current diagnosis of depression, hopelessness, chronic severe medical condition and suicide attempt 20 years ago. The patient is deemed to be at chronic elevated risk for violence given the following factors: male gender. These risk factors are mitigated by the following factors:lack of active SI/HI, motivation for treatment, utilization of positive coping skills, supportive family, sense of responsibility to family and social supports, minor children living at home, presence of a significant relationship, presence of an available support system, current treatment compliance, effective problem solving skills, safe housing and support system in agreement with treatment recommendations. There is no acute risk for suicide or violence at this time. The patient was educated about relevant modifiable risk factors including following recommendations for treatment of psychiatric illness and abstaining from substance abuse.    While future psychiatric events cannot be accurately predicted, the patient does not currently require acute inpatient psychiatric care and does not currently meet [facility] involuntary commitment criteria.      Plan:    Problem 1:??Major Depressive Disorder  Status of problem: chronic with moderate to severe exacerbation  Interventions:??  ??? Continue??nortriptyline??100??mg??at bedtime.   ??? Monitor nortriptyline level given elevated creatinine levels.??Nortriptyline level drawn on 12/21/20 was 66 (Ref range 70-170).??  ??? Start mirtazapine 15 mg at bedtime??  ??? Continue psychotherapy with local therapist  ??  Problem 2: Anxiety  Status of problem: chronic with mild exacerbation  Interventions: see MDD above  ??  Problem 3: Insomnia  Status of problem:  chronic with moderate to severe exacerbation  Interventions:   ??? Continue clonazepam 0.5 mg at bedtime. Last filled on 04/19/21 per PDMP review.  ??? See mirtazapine plan above      Psychotherapy provided:  No billable psychotherapy service provided.    Patient has been given this writer's contact information as well as the [facility] urgent line number. The patient has been instructed to call 911 for emergencies.      Subjective:    Chief complaint:  Follow-up psychiatric evaluation for depression and insomnia    Interval History:   Patient endorsing worsening depression that he believes stems from issues with pain that prevents him from engaging in activities that he enjoys and social isolation  out of fears about getting sick. Sleep is now a problem due to worsening pain (difficulty falling asleep). 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 Endorsing depressed mood, anhedonia, lack of motivation, feelings of hopelessness, feelings of worthlessness, and fatigue. He denies suicidal ideation (has had some passive SI, but states that he would never do that because it would hurt his family), excessive anxiety, or panic attacks. Tired all of the time.     Prior Psychiatric Medication Trials: bupropion (could not tolerate), duloxetine, mirtazapine (given during recent hospitalization), lorazepam    Psychiatric Review of Systems  ??? Depressive Symptoms: anhedonia, depressed mood, difficulty concentrating, fatigue, feelings of worthlessness/guilt, hopelessness, insomnia and passive suicidal thoughts without intent or plan  ??? Manic Symptoms: N/A  ??? Psychosis Symptoms: N/A  ??? Anxiety Symptoms: difficulty controlling worry, easily fatigued, difficulty concentrating, difficulty falling asleep   ??? Panic Symptoms: denies  ??? PTSD Symptoms: no recent issues  ??? Sleep disturbance: difficulty falling asleep and non-restful sleep   ??? Exercise: Unable to engage in a daily exercise routine.        Objective:      Mental Status Exam:  Appearance:    Appears stated age, Well nourished and Clean/Neat   Motor:   No abnormal movements   Speech/Language:    Normal rate, volume, tone, fluency   Mood:   Depressed   Affect:   Blunted, Cooperative, Decreased range, Depressed and Mood congruent   Thought process and Associations:   Logical, linear, clear, coherent, goal directed   Abnormal/psychotic thought content:     Denies SI, HI, self harm, delusions, obsessions, paranoid ideation, or ideas of reference   Perceptual disturbances:     Denies auditory and visual hallucinations, behavior not concerning for response to internal stimuli     Other:   insight and judgment intact     I personally spent 40 minutes face-to-face and non-face-to-face in the care of this patient, which includes all pre, intra, and post visit time on the date of service.    Visit was completed by video (or phone) and the appropriate disclaimer has been included below.    The patient reports they are currently: at home. I spent 25 minutes on the real-time audio and video with the patient on the date of service. I spent an additional 15 minutes on pre- and post-visit activities on the date of service.     The patient was physically located in [place] or a state in which I am permitted to provide care. The patient and/or parent/guardian understood that s/he may incur co-pays and cost sharing, and agreed to the telemedicine visit. The visit was reasonable and appropriate under the circumstances given the patient's presentation at the time.    The patient and/or parent/guardian has been advised of the potential risks and limitations of this mode of treatment (including, but not limited to, the absence of in-person examination) and has agreed to be treated using telemedicine. The patient's/patient's family's questions regarding telemedicine have been answered.     If the visit was completed in an ambulatory setting, the patient and/or parent/guardian has also been advised to contact their provider???s office for worsening conditions, and seek emergency medical treatment and/or call 911 if the patient deems either necessary.      Sheran Lawless, PMHNP  06/03/2021

## 2021-06-03 NOTE — Unmapped (Signed)
Called patient to let him know that he has appt for PVLs tomorrow at 11:15 and visit with NP in infusion tomorrow at 12p.

## 2021-06-03 NOTE — Unmapped (Signed)
Adam Keith,    Thank you for meeting with me today. I'm so sorry that you have been feeling so bad. Here's our plan:    Continue the nortriptyline 100 mg at bedtime  Start mirtazapine 15 mg at bedtime. This medication helps with sleep, depression, anxiety, and appetite.  Continue the clonazepam to help with sleep and/or anxiety    Please feel free to reach out with any questions or concerns.    Best,  Tashianna Broome    Comprehensive Cancer Support Program (CCSP) - Psychiatry Outpatient Clinic   After Visit Summary    It was a pleasure to see you today in the [facility]???s Comprehensive Cancer Support Program (CCSP). The CCSP is a multidisciplinary program dedicated to helping patients, caregivers, and families with cancer treatment, recovery and survivorship.      To schedule, cancel, or change your appointment:  Please call the [facility] schedulers at [phone], Monday through Friday 8AM - 5PM.  Someone will return your call within 24 hours.      If you have a question about your medicines or you need to contact your provider:  Please call the CCSP program coordinator, Myrene Galas, at [phone].     For after hours urgent issues, you may call [phone] or call the I need to talk line at [phone] ([phone]) anytime 24/7.    CCSP Patient and Family Resource Center: [phone].    CCSP Website:  [email or web address]    For prescription refills, please allow at least 24 hours (during business hours, M-F) for providers to call in refills to your pharmacy. We are generally unable to accommodate same-day requests for refills.     If you are taking any controlled substances (such as anxiety or sleep medications), you must use them as the directions say to use them. We generally do not provide early refills over the phone without clear reason, and it would be inappropriate to obtain the medications from other doctors. We routinely use the [place] controlled substance database to monitor prescription drug use.

## 2021-06-03 NOTE — Unmapped (Signed)
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 He was recently admitted to [facility] with with complicated PNA requiring intubation. Seen today for evaluation of LUE pain.    Assessment/Plan:     # Depressed Mood: Overall severe with passive SI (no current safety threat). Depression is primarily associated with patient's constant physical setbacks, his inability to engage in activities he enjoys, and his general isolation d/t fears about getting sick. His wife is his primary support, and he also follows with psych and a therapist.    - Will contact NP Leonor Liv to discuss current depression management  - Encouraged patient to call 911 if he becomes actively suicidal  - Will assess eligibility for the [employer] Program    # RLE pain: Primarily in shin radiating into the foot, started ~3 weeks ago when his UE pain began to improve. Given history (malignancy, recent hospitalization), c/f DVT.  - Onc team contacted to coordinate DVT w/u       - If negative, eval for other causes  - Increase gabapentin to 600mg  qAM and 900mg  qbedtime       - Discontinue afternoon dose  - Continue dilaudid 2mg  PO BID PRN       - PDMP reviewed, refilled today, ok to fill today  - Continue oxycontin 15mg  PO BID       - PDMP reviewed, refilled today, ok to fill 06/20/21  - Referral to cancer rehab    # LUE pain: Improved.   - Pain management as above    #Chronic pain: Exacerbated by acute pain and likely worsened by depression.   - Pain management as above    #Controlled substances risk management:  ??? Patient does not have a signed pain medication agreement with our team.  ??? [place] database was reviewed today and it was??appropriate.  ??? Urine drug screen was not??performed at this visit. Findings: not applicable.  ??? Patient has received information about safe storage and administration of medications.  ??? Patient has not??received a prescription for narcan; is not applicable.     F/u: 4-6 weeks  ----------------------------------------  Referring Provider:??Dr. Berline Lopes  Oncology Team: Malignant hematology team  PCP:??[name], MD    HPI: 41 year old man with a diagnosis of B-ALL, diagnosed in October 2021, now in complete remission.??Recently admitted to the [facility] ICU 03/06/21-03/20/21 for AHRF d/t MRSA/klebsiella PNA, requiring intubation. This is following admissions in August, May, April, March, February, and January 2022.    Farrell's LUE pain has improved significantly with gabapentin (though he rarely takes the afternoon dose), but shortly after this started to resolve he developed severe pain in the RLE, primarily in the shin and radiating into the foot. It is worst at night when lying flat, and also causes significant discomfort when he first walks around in the morning. Because of this he has had marked insomnia so always feels tired. He has also generally been unable to engage in his hobbies (he only goes to the woodshop a few times/week) due to fatigue, and has remained fairly isolated from his friends and family, and doesn't go out often, d/t concerns about getting sick.     All of this is causing significant emotional distress. Though he has some good days and some bad days, overall Rasaan's mood is very poor and he often just wants to lay down and cry. He has also recently experienced passive SI, though he  states he would never follow through because he wouldn't want to leave his wife or children alone. He also has guilt related to an accident that killed many of his friends in the [employer], but which he survived because he was not deployed with them.     Palliative Performance Scale: 70% - Ambulation: Reduced / unable to do normal work, some evidence of disease / Self-Care: Full / Intake: Normal or reduced / Level of Conscious: Full    Coping/Support Issues: Having a lot of difficulty coping with his current setback. Well supported by his wife. Also has a psychiatrist and therapist.  ??  Goals of Care: Treatment and cancer-directed therapy  ??  Social History:  Name of primary support: [name]  Occupation: Was in the [employer]	[place]  Hobbies: Training and development officer  Current residence / distance from [facility]: [place]  ??  Advance Care Planning: Did not discuss this visit.    Objective     Allergies:   Allergies   Allergen Reactions   ??? Bupropion Hcl Other (See Comments)     Per patient out of touch with reality, suicidal, homicidal   ??? Cefepime Rash     DRESS   ??? Ceftaroline Fosamil Rash and Other (See Comments)     Rash X 2 02/2020, suspected DRESS 06/2020   ??? 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 Onion Anaphylaxis   ??? Vancomycin Analogues      Hearing loss with Lasix  Other reaction(s): Other (See Comments)  Hearing loss  lasix   ??? Bismuth Subsalicylate Nausea And Vomiting   ??? Privigen [Immun Glob G(Igg)-Pro-Iga 0-50] Other (See Comments)     03/10/2021 VIG infusion stopped as patient developed rigors, tachycardia and HTN during infusion despite pre-medication; 03/12/21 completed IVIG with premedications and slower rate of infusion   ??? Furosemide      With Vancomycin caused hearing loss  Other reaction(s): Other (See Comments)  With Vancomycin caused hearing loss       Family History:  Cancer-related family history is negative for Melanoma.  He indicated that the status of his neg hx is unknown.      REVIEW OF SYSTEMS:  A comprehensive review of 10 systems was negative except for pertinent positives noted in HPI.    Lab Results   Component Value Date    CREATININE 1.63 (H) 05/04/2021     Lab Results   Component Value Date    ALKPHOS 95 05/04/2021    BILITOT 0.3 05/04/2021    BILIDIR 0.30 03/20/2021    PROT 6.9 05/04/2021    ALBUMIN 3.9 05/04/2021    ALT 24 05/04/2021    AST 21 05/04/2021          The patient reports they are currently: at home. I spent 45 minutes on the real-time audio and video with the patient on the date of service. I spent an additional 20 minutes on pre- and post-visit activities on the date of service.   ??  The patient was physically located in [place] or a state in which I am permitted to provide care. The patient and/or parent/guardian understood that s/he may incur co-pays and cost sharing, and agreed to the telemedicine visit. The visit was reasonable and appropriate under the circumstances given the patient's presentation at the time.  ??  The patient and/or parent/guardian has been advised of the potential risks and limitations of this mode of treatment (including, but not  limited to, the absence of in-person examination) and has agreed to be treated using telemedicine. The patient's/patient's family's questions regarding telemedicine have been answered.   ??  If the visit was completed in an ambulatory setting, the patient and/or parent/guardian has also been advised to contact their provider???s office for worsening conditions, and seek emergency medical treatment and/or call 911 if the patient deems either necessary.    Barbette Merino MD MPH  Hospice and Palliative Medicine Fellow

## 2021-06-03 NOTE — Unmapped (Signed)
Addended by: Barbette Merino A on: 06/03/2021 12:15 PM     Modules accepted: Orders

## 2021-06-04 ENCOUNTER — Other Ambulatory Visit: Admit: 2021-06-04 | Discharge: 2021-06-05 | Payer: MEDICAID

## 2021-06-04 ENCOUNTER — Ambulatory Visit: Admit: 2021-06-04 | Discharge: 2021-06-05 | Payer: MEDICAID

## 2021-06-04 ENCOUNTER — Ambulatory Visit: Admit: 2021-06-04 | Discharge: 2021-06-05 | Payer: MEDICAID | Attending: Adult Health | Primary: Adult Health

## 2021-06-04 DIAGNOSIS — R2241 Localized swelling, mass and lump, right lower limb: Principal | ICD-10-CM

## 2021-06-04 DIAGNOSIS — R52 Pain, unspecified: Principal | ICD-10-CM

## 2021-06-04 DIAGNOSIS — C9101 Acute lymphoblastic leukemia, in remission: Principal | ICD-10-CM

## 2021-06-04 DIAGNOSIS — C9100 Acute lymphoblastic leukemia not having achieved remission: Principal | ICD-10-CM

## 2021-06-04 LAB — COMPREHENSIVE METABOLIC PANEL
ALBUMIN: 4.2 g/dL (ref 3.4–5.0)
ALKALINE PHOSPHATASE: 85 U/L (ref 46–116)
ALT (SGPT): 20 U/L (ref 10–49)
ANION GAP: 10 mmol/L (ref 5–14)
AST (SGOT): 23 U/L (ref <=34)
BILIRUBIN TOTAL: 0.2 mg/dL — ABNORMAL LOW (ref 0.3–1.2)
BLOOD UREA NITROGEN: 17 mg/dL (ref 9–23)
BUN / CREAT RATIO: 10
CALCIUM: 9.4 mg/dL (ref 8.7–10.4)
CHLORIDE: 108 mmol/L — ABNORMAL HIGH (ref 98–107)
CO2: 23 mmol/L (ref 20.0–31.0)
CREATININE: 1.78 mg/dL — ABNORMAL HIGH
EGFR CKD-EPI (2021) MALE: 49 mL/min/{1.73_m2} — ABNORMAL LOW (ref >=60)
GLUCOSE RANDOM: 93 mg/dL (ref 70–179)
POTASSIUM: 4.1 mmol/L (ref 3.4–4.8)
PROTEIN TOTAL: 6.6 g/dL (ref 5.7–8.2)
SODIUM: 141 mmol/L (ref 135–145)

## 2021-06-04 LAB — CBC W/ AUTO DIFF
BASOPHILS ABSOLUTE COUNT: 0.2 10*9/L — ABNORMAL HIGH (ref 0.0–0.1)
BASOPHILS RELATIVE PERCENT: 1.4 %
EOSINOPHILS ABSOLUTE COUNT: 0.8 10*9/L — ABNORMAL HIGH (ref 0.0–0.5)
EOSINOPHILS RELATIVE PERCENT: 7.4 %
HEMATOCRIT: 42.1 % (ref 39.0–48.0)
HEMOGLOBIN: 14.9 g/dL (ref 12.9–16.5)
LYMPHOCYTES ABSOLUTE COUNT: 2.7 10*9/L (ref 1.1–3.6)
LYMPHOCYTES RELATIVE PERCENT: 25.5 %
MEAN CORPUSCULAR HEMOGLOBIN CONC: 35.5 g/dL (ref 32.0–36.0)
MEAN CORPUSCULAR HEMOGLOBIN: 30 pg (ref 25.9–32.4)
MEAN CORPUSCULAR VOLUME: 84.8 fL (ref 77.6–95.7)
MEAN PLATELET VOLUME: 8 fL (ref 6.8–10.7)
MONOCYTES ABSOLUTE COUNT: 0.8 10*9/L (ref 0.3–0.8)
MONOCYTES RELATIVE PERCENT: 7.2 %
NEUTROPHILS ABSOLUTE COUNT: 6.3 10*9/L (ref 1.8–7.8)
NEUTROPHILS RELATIVE PERCENT: 58.5 %
PLATELET COUNT: 251 10*9/L (ref 150–450)
RED BLOOD CELL COUNT: 4.96 10*12/L (ref 4.26–5.60)
RED CELL DISTRIBUTION WIDTH: 15.7 % — ABNORMAL HIGH (ref 12.2–15.2)
WBC ADJUSTED: 10.7 10*9/L (ref 3.6–11.2)

## 2021-06-04 NOTE — Unmapped (Unsigned)
Labs drawn and sent for analysis. Care provided by S.Lucretia Roers Charity fundraiser

## 2021-06-05 NOTE — Unmapped (Signed)
Addended by: Marita [place] on: 06/04/2021 05:51 PM     Modules accepted: Level of Service

## 2021-06-07 NOTE — Unmapped (Signed)
Called patient about his left hand which had been red, blistered, and painful.  Patient reports that it has almost resolved.  It feels much better and no visible blisters or redness.  Patient stated that he will upload a picture for MyChart so team can see it.  Encouraged patient to reach out if he needed anything or had any concerns.

## 2021-06-10 NOTE — Unmapped (Unsigned)
Physical Medicine and Rehab  Clinic Note          Chief Complaint: ***    History of Present Illness: Adam Keith is a 41 y.o. 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    Per review of EMR, he has had ongoing leg pain interfering with sleep. He has seen palliative care and reported ~1 month of RLE pain and his gabapentin was increased. No changes were made to opiate medications and he was also recommended for DVT work up, which was negative.     Today patient ***  Pain:  Nutrition:  Bladder:  Bowel:  Skin:    Cancer History:  Oncology History Overview Note   Referring/Local Oncologist: None    Diagnosis:Ph+ ALL    Genetics:    Karyotype/FISH:Abnormal Karyotype: 46,XY,t(9;22)(q34;q11.2)[1]/45,XY,der(7;9)(q10;q10)t(9;22)(q34;q11.2),der(22)t(9;22)[11]/46,sdl,+der(22)t(9;22)[5]/46,XY[3]     Abnormal FISH: A BCR/ABL1 interphase FISH assay shows an abnormal signal pattern in 97% of the 100 cells scored. 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  The findings support a diagnosis of leukemia and have implications for targeted therapy and for monitoring residual disease.      Molecular Genetics:  BCR-ABL1 p210 transcripts were detected at a level of 46.479 IS% ratio in bone marrow.  BCR-ABL1 p190 transcripts were detected at a level of 4 in 100,000 cells in bone marrow.    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)     01/24/2020 - 04/02/2020 Chemotherapy    IP/OP LEUKEMIA GRAAPH-2005 + RITUXIMAB < 60 YO  rituximab hypercvad (odd and even course)     02/24/2020 Remission    CR with PCR MRD+; marrow showing 70% blasts with <1% blasts, negative flow MRD, BCR-ABL p210 PCR 0.197%; CNS negative for blasts     03/09/2020 Progression    CSF - rare blast ID'ed - IT chemo given    03/13/20 - IT chemo, CSF negative     04/16/2020 Biopsy    BM Bx with 40-50% cellularity, <1% blasts, MRD flow cytometry negative, BCR-ABL p210 transcripts 0.036%     05/28/2020 - 05/28/2020 Chemotherapy    OP AML - CNS THERAPY (INTRATHECAL CYTARABINE, INTRATHECAL METHOTREXATE, OR INTRATHECAL TRIPLE)  Select one of the following: cytarabine IT 100 mg with hydrocortisone 50 mg, cytarabine IT 40 mg with methotrexate 15 mg with hydrocortisone 50 mg, OR methotrexate IT 12 mg with hydrocortisone 50 mg     06/19/2020 -  Chemotherapy    IP/OP LEUKEMIA BLINATUMOMAB 7-DAY INFUSION (MINIMAL RESIDUAL DISEASE; WT >= 22 KG) (HOME INFUSION)      Cycles 1*-4: Blinatumomab 28 mcg/day Days 1-28 of 6-week cycle.  *Given in the inpatient setting on Days 1-3 on Cycle 1 and Days 1-2 on Cycle 2, while other treatment days are given in the outpatient setting.    Cycle 1 MRD Dosing     07/18/2020 Adverse Reaction    Hospitalization: fevers. Pneumonia     07/23/2020 Biopsy    Diagnosis  Bone marrow, right iliac, aspiration and biopsy  -   Normocellular bone marrow (50%) with trilineage hematopoiesis and 1% blasts by manual aspirate differential  -   Flow cytometry  MRD analysis reveals no definitive immunophenotypic evidence of residual B lymphoblastic leukemia   - BCR-ABL p210 0.006%         08/10/2020 -  Chemotherapy    Cycle 2 blinatumomab-ponatinib  Ponatinib 30mg     1 IT per cycle     09/21/2020 Adverse Reaction    Covid-19, symptomatic but not requiring hospitalization.     10/06/2020 -  Chemotherapy    Cycle 3 blinatumomab-ponatinib  Ponatinib 30mg        11/01/2020 Adverse Reaction    Hospitalization: MRSA Pneumonia requiring intubation    Blinatumomab stopped.  Ponatinib held until he stabilized.       12/14/2020 -  Chemotherapy    Cycle 4 blinatumomab 28 mcg/day days 1-28, ponatinib 30 mg per day IT triple therapy day 29     01/13/2021 -  Chemotherapy    Ponatinib 30mg  daily  - due to due low level BCR-ABL     03/06/2021 Adverse Reaction    Hospitalization - ICU on ventilator - influenza and MRSA/klebsiella PNA     ALL (acute lymphoblastic leukemia) (CMS-HCC)   06/11/2020 -  Chemotherapy    IP/OP LEUKEMIA BLINATUMOMAB 7-DAY INFUSION (MINIMAL RESIDUAL DISEASE; WT >= 22 KG) (HOME INFUSION)  Cycles 1*-4: Blinatumomab 28 mcg/day Days 1-28 of 6-week cycle.  *Given in the inpatient setting on Days 1-3 on Cycle 1 and Days 1-2 on Cycle 2, while other treatment days are given in the outpatient setting.     09/21/2020 Initial Diagnosis    ALL (acute lymphoblastic leukemia) (CMS-HCC)     Acute lymphoblastic leukemia in remission (CMS-HCC)   06/11/2020 -  Chemotherapy    IP/OP LEUKEMIA BLINATUMOMAB 7-DAY INFUSION (MINIMAL RESIDUAL DISEASE; WT >= 22 KG) (HOME INFUSION)  Cycles 1*-4: Blinatumomab 28 mcg/day Days 1-28 of 6-week cycle.  *Given in the inpatient setting on Days 1-3 on Cycle 1 and Days 1-2 on Cycle 2, while other treatment days are given in the outpatient setting.     11/09/2020 Initial Diagnosis    Acute lymphoblastic leukemia in remission (CMS-HCC)         Medical / Surgical History:   Past Medical History:   Diagnosis Date   ??? 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 BONE MARROW BIOPSY & ASPIRATION  01/21/2020        ??? CHG [place], CHEST,REAL TIME  11/20/2020    Procedure: ULTRASOUND, CHEST, REAL TIME WITH IMAGE DOCUMENTATION;  Surgeon: Jerelyn Charles, MD;  Location: [facility] PROCEDURE LAB [facility];  Service: Pulmonary   ??? IR INSERT PORT AGE GREATER THAN 5 YRS  03/10/2020    IR INSERT PORT AGE GREATER THAN 5 YRS 03/10/2020 [name], MD IMG VIR [facility]   ??? PR BRONCHOSCOPY,DIAGNOSTIC W LAVAGE Bilateral 07/20/2020    Procedure: BRONCHOSCOPY, RIGID OR FLEXIBLE, INCLUDE FLUOROSCOPIC GUIDANCE WHEN PERFORMED; W/BRONCHIAL ALVEOLAR LAVAGE WITH MODERATE SEDATION;  Surgeon: Dellis Filbert, MD;  Location: [facility] PROC[facility];  Service: Pulmonary        Social History:   Social History     Tobacco Use   ??? Smoking status: Every Day     Packs/day: 2.00     Types: Cigarettes     Last attempt to quit: 01/20/2020     Years since quitting: 1.3   ??? Smokeless tobacco: Former     Types: Musician Use   ??? Vaping  Use: Never used   Substance Use Topics   ??? Alcohol use: Not Currently            Family History: Reviewed and non-contributory to rehab needs  family history is not on file.    Allergies:   Bupropion hcl, Cefepime, Ceftaroline fosamil, Dapsone, Onion, Vancomycin analogues, Bismuth subsalicylate, Privigen [immun glob g(igg)-pro-iga 0-50], and Furosemide    Medications:   Current Outpatient Medications   Medication Instructions   ??? acetaminophen (TYLENOL) 650 mg, Every 6 hours PRN   ??? albuterol HFA 90 mcg/actuation inhaler 2 puffs, Inhalation, Every 6 hours PRN   ??? amLODIPine (NORVASC) 10 mg, Oral, Daily (standard)   ??? arm brace (WRIST BRACE) Misc 1 Piece, Miscellaneous, As needed (once a day), left ulnar wrist brace   ??? aspirin 81 MG chewable tablet Chew 1 tablet (81 mg total) daily.   ??? carvediloL (COREG) 12.5 mg, Oral, 2 times a day (standard)   ??? chlorhexidine (HIBICLENS) 4 % external liquid Topical, Daily, as directed   ??? clonazePAM (KLONOPIN) 0.5 mg, Oral, 2 times a day PRN   ??? doxycycline (VIBRA-TABS) 100 mg, Oral, 2 times a day (standard)   ??? gabapentin (NEURONTIN) 300 MG capsule Take 2 capsules (600mg ) by mouth in the morning and 3 capsules (900mg ) at bedtime. May cause drowsiness.   ??? HYDROmorphone (DILAUDID) 2 mg, Oral, 2 times a day PRN   ??? mirtazapine (REMERON) 15 mg, Oral, Nightly   ??? mucus clearing device Devi 1 each, Miscellaneous, 2 times a day (standard)   ??? mupirocin (BACTROBAN) 2 % ointment Topical, Daily (standard), apply in each nostril prior to bed, as directed   ??? nortriptyline (PAMELOR) 100 mg, Oral, Nightly   ??? OxyCONTIN 15 mg, Oral, 2 times a day (standard)   ??? PONATinib (ICLUSIG) 30 mg tablet Take 1 tablet (30 mg total) by mouth daily. Swallow tablets whole. Do not crush, break, cut or chew tablets.   ??? sulfamethoxazole-trimethoprim (BACTRIM DS) 800-160 mg per tablet 160 mg of trimethoprim, Oral, 2 times a day on saturday, sunday, For prophylaxis while on chemo.   ??? umeclidinium-vilanteroL (ANORO ELLIPTA) 62.5-25 mcg/actuation inhaler 1 puff, Inhalation, Daily (standard)   ??? valACYclovir (VALTREX) 500 MG tablet Take 1 tablet (500 mg total) by mouth daily.         Review of Systems:    General ROS:   {ros baratta:67414::Full 10 systems reviewed and neg, unless noted in HPI}  OBJECTIVE:     Vitals:  There were no vitals filed for this visit.      Physical Exam:    GEN: {jmb exam gen:67762::Lying in bed in NAD.}  HEENT: {jmb exam heent:68258::Atraumatic.,Normocephalic.,Moist mucous membranes.,Trachea midline.}  RESP: {jmb exam resp:68259::NWOB on RA.}  CV: RRR *** edema  GI: abd soft, NTND  GU: *** Foley   SKIN: {jmb skin:67761::no rashes or ecchymoses on exposed skin}  MSK:  no notable contractures, no visible swelling or erythema over joints, joints NTTP  NEURO:  Mental Status: A&Ox***, attention ***, speech ***fluid and coherent, follows commands ***  Cranial Nerve:   ***2nd Cranial Nerve:  Intact Vision. 3rd, 4th, 6th Cranial Nerves:  Pupils equal, round, reactive to light; full extraocular movements. 5th Cranial Nerve: normal facial sensation. 7th Cranial Nerve: Full and symmetrical facial movement. 8th Cranial Nerve: Hears finger rub well bilaterally. 9th, 10th Cranial Nerves: Symmetrical palate 11th Cranial nerve: Normal trapezius and sternocleidomastoid. 12th Cranial nerve: Normal tongue movement.   {JMBCranial Nerves:61183::visual acuity intact, no visual fields  deficits, pupils equal, EOMI, facial sensation bilaterally, no facial droop, hearing grossly intact b/l, shoulder shrug full and equal, tongue protrudes midline}  Sensory: BUE and BLE sensation intact to light touch***  Motor:     RUE/LUE: shoulder abd ***/***, biceps ***/***, triceps ***/***, wrist extension ***/***, hand grasp ***/***    RLE/LLE: hip flexion ***/***, knee extension ***/***, DF ***/***, 1st toe extension ***/*** PF ***/***  Tone: within normal limits, no spasticity noted  Reflexes: {jmb reflexes:67763::no clonus}  Cerebellar: no abnml or extraneous mvmts, FNF wnl no dysmetria,*** no pronator drift  PSYCH: mood euthymic, affect appropriate, thought process logical***     Labs and Diagnostic Studies: Reviewed ***  CBC -   No results found for requested labs within last 2 days.     BMP -   No results found for requested labs within last 2 days.     Coagulation -   No results found for requested labs within last 2 days.     Cardiac markers -   No results found for requested labs within last 2 days.     LFT's -   No results found for requested labs within last 2 days.       Radiology Results: Personally reviewed ***    Doppler RLE 06/04/21  Final Interpretation  Right  There is no evidence of DVT in the lower extremity. However, portions of this examination were limited- see technologist comments above. There is no evidence of obstruction proximal to the inguinal ligament or in the common femoral vein.  Left  There is no evidence of obstruction proximal to the inguinal ligament or in the common femoral vein.    ASSESSMENT / RECOMMENDATIONS:     Adam Keith is a 41 y.o. 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    Functional Impairment(s) and Rehabilitation Plan of Care    1. RLE pain    _______________________________________________  Corrin Parker, MD  Director of Cancer Rehabilitation  Department of Physical Medicine and Rehabilitation  [facility]    I personally spent *** minutes face-to-face and non-face-to-face in the care of this patient, which includes all pre, intra, and post visit time on the date of service.

## 2021-06-14 DIAGNOSIS — C9101 Acute lymphoblastic leukemia, in remission: Principal | ICD-10-CM

## 2021-06-14 MED ORDER — PONATINIB 30 MG TABLET
ORAL_TABLET | Freq: Every day | ORAL | 5 refills | 30 days | Status: CP
Start: 2021-06-14 — End: ?
  Filled 2021-06-21: qty 30, 30d supply, fill #0

## 2021-06-14 NOTE — Unmapped (Signed)
[facility] Clinical Assessment & Refill Coordination Note    Kent Riendeau, DOB: 1980-05-14  Phone: [phone] (home)     All above HIPAA information was verified with patient.     Was a Nurse, learning disability used for this call? No    Specialty Medication(s):   Hematology/Oncology: Iclusig     Current Outpatient Medications   Medication Sig Dispense Refill   ??? acetaminophen (TYLENOL) 325 MG tablet Take 650 mg by mouth every six (6) hours as needed for pain.  (Patient not taking: Reported on 06/03/2021)     ??? albuterol HFA 90 mcg/actuation inhaler Inhale 2 puffs every six (6) hours as needed for wheezing. (Patient not taking: Reported on 04/27/2021) 8 g 11   ??? amLODIPine (NORVASC) 10 MG tablet Take 1 tablet (10 mg total) by mouth daily. 30 tablet 0   ??? arm brace (WRIST BRACE) Misc 1 Piece by Miscellaneous route as needed. left ulnar wrist brace (Patient not taking: Reported on 06/03/2021) 1 each 0   ??? aspirin 81 MG chewable tablet Chew 1 tablet (81 mg total) daily. 36 tablet 11   ??? carvediloL (COREG) 12.5 MG tablet Take 1 tablet (12.5 mg total) by mouth Two (2) times a day. 180 tablet 1   ??? chlorhexidine (HIBICLENS) 4 % external liquid Apply topically daily. as directed (Patient not taking: Reported on 04/27/2021) 946 mL 0   ??? clonazePAM (KLONOPIN) 0.5 MG tablet Take 1 tablet (0.5 mg total) by mouth two (2) times a day as needed for anxiety or sleep. 60 tablet 1   ??? doxycycline (VIBRA-TABS) 100 MG tablet Take 1 tablet (100 mg total) by mouth Two (2) times a day. 180 tablet 3   ??? gabapentin (NEURONTIN) 300 MG capsule Take 2 capsules (600mg ) by mouth in the morning and 3 capsules (900mg ) at bedtime. May cause drowsiness. 150 capsule 5   ??? HYDROmorphone (DILAUDID) 2 MG tablet Take 1 tablet (2 mg total) by mouth two (2) times a day as needed. 56 tablet 0   ??? mirtazapine (REMERON) 15 MG tablet Take 1 tablet (15 mg total) by mouth nightly. 30 tablet 11   ??? mucus clearing device Devi 1 each by Miscellaneous route Two (2) times a day. (Patient not taking: Reported on 06/03/2021) 1 each 0   ??? mupirocin (BACTROBAN) 2 % ointment Apply topically daily. apply in each nostril prior to bed, as directed (Patient not taking: Reported on 04/27/2021) 15 g 0   ??? nortriptyline (PAMELOR) 50 MG capsule Take 100 mg by mouth nightly.     ??? OXYCONTIN 15 mg 12 hr crush resistant ER/CR tablet Take 1 tablet (15 mg total) by mouth Two (2) times a day. 56 tablet 0   ??? PONATinib (ICLUSIG) 30 mg tablet Take 1 tablet (30 mg total) by mouth daily. Swallow tablets whole. Do not crush, break, cut or chew tablets. 30 tablet 5   ??? sulfamethoxazole-trimethoprim (BACTRIM DS) 800-160 mg per tablet Take 1 tablet (160 mg of trimethoprim total) by mouth 2 times a day on Saturday, Sunday. For prophylaxis while on chemo. 48 tablet 3   ??? umeclidinium-vilanteroL (ANORO ELLIPTA) 62.5-25 mcg/actuation inhaler Inhale 1 puff daily. 60 each 11   ??? valACYclovir (VALTREX) 500 MG tablet Take 1 tablet (500 mg total) by mouth daily. 90 tablet 11     No current facility-administered medications for this visit.        Changes to medications: Deshawn reports no changes at this time.  Allergies   Allergen Reactions   ??? Bupropion Hcl Other (See Comments)     Per patient out of touch with reality, suicidal, homicidal   ??? Cefepime Rash     DRESS   ??? Ceftaroline Fosamil Rash and Other (See Comments)     Rash X 2 02/2020, suspected DRESS 06/2020   ??? 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 Onion Anaphylaxis   ??? Vancomycin Analogues      Hearing loss with Lasix  Other reaction(s): Other (See Comments)  Hearing loss  lasix   ??? Bismuth Subsalicylate Nausea And Vomiting   ??? Privigen [Immun Glob G(Igg)-Pro-Iga 0-50] Other (See Comments)     03/10/2021 VIG infusion stopped as patient developed rigors, tachycardia and HTN during infusion despite pre-medication; 03/12/21 completed IVIG with premedications and slower rate of infusion   ??? Furosemide      With Vancomycin caused hearing loss  Other reaction(s): Other (See Comments)  With Vancomycin caused hearing loss       Changes to allergies: No    SPECIALTY MEDICATION ADHERENCE     Iclusig 30 mg: 8-10 days of medicine on hand     Medication Adherence    Patient reported X missed doses in the last month: 0  Specialty Medication: Iclusig 30 mg - 1 tablet daily  Patient is on additional specialty medications: No  Informant: patient  Confirmed plan for next specialty medication refill: delivery by pharmacy  Refills needed for supportive medications: not needed          Specialty medication(s) dose(s) confirmed: Regimen is correct and unchanged.     Are there any concerns with adherence? No    Adherence counseling provided? Not needed    CLINICAL MANAGEMENT AND INTERVENTION      Clinical Benefit Assessment:    Do you feel the medicine is effective or helping your condition? Yes    Clinical Benefit counseling provided? Not needed    Adverse Effects Assessment:    Are you experiencing any side effects? No    Are you experiencing difficulty administering your medicine? No    Quality of Life Assessment:    Quality of Life    Rheumatology  Oncology  2. On a scale of 1-10, how would you rate your ability to manage side effects associated with your specialty medication? (1=no issues, 10 = unable to take medication due to side effects): 1  Dermatology  Cystic Fibrosis          How many days over the past month did your ALL  keep you from your normal activities? For example, brushing your teeth or getting up in the morning. 0    Have you discussed this with your provider? Not needed    Acute Infection Status:    Acute infections noted within Epic:  MDR Bacteria  Patient reported infection: None    Therapy Appropriateness:    Is therapy appropriate and patient progressing towards therapeutic goals? Yes, therapy is appropriate and should be continued    DISEASE/MEDICATION-SPECIFIC INFORMATION N/A    PATIENT SPECIFIC NEEDS     - Does the patient have any physical, cognitive, or cultural barriers? No    - Is the patient high risk? Yes, patient is taking oral chemotherapy. Appropriateness of therapy as been assessed    - Does the patient require a Care Management Plan? No     SOCIAL DETERMINANTS OF HEALTH     At the  [facility], we have learned that life circumstances - like trouble affording food, housing, utilities, or transportation can affect the health of many of our patients.   That is why we wanted to ask: are you currently experiencing any life circumstances that are negatively impacting your health and/or quality of life? Patient declined to answer    Social Determinants of Health     Food Insecurity: No Food Insecurity   ??? Worried About Programme researcher, broadcasting/film/video in the Last Year: Never true   ??? Ran Out of Food in the Last Year: Never true   Tobacco Use: High Risk   ??? Smoking Tobacco Use: Every Day   ??? Smokeless Tobacco Use: Former   ??? Passive Exposure: Not on file   Transportation Needs: No Transportation Needs   ??? Lack of Transportation (Medical): No   ??? Lack of Transportation (Non-Medical): No   Alcohol Use: Not on file   Housing/Utilities: Low Risk    ??? Within the past 12 months, have you ever stayed: outside, in a car, in a tent, in an overnight shelter, or temporarily in someone else's home (i.e. couch-surfing)?: No   ??? Are you worried about losing your housing?: No   ??? Within the past 12 months, have you been unable to get utilities (heat, electricity) when it was really needed?: No   Substance Use: Not on file   Financial Resource Strain: Low Risk    ??? Difficulty of Paying Living Expenses: Not very hard   Physical Activity: Not on file   Health Literacy: Not on file   Stress: Not on file   Intimate Partner Violence: Not on file   Depression: Not on file   Social Connections: Not on file       Would you be willing to receive help with any of the needs that you have identified today? Not applicable       SHIPPING     Specialty Medication(s) to be Shipped:   Hematology/Oncology: Iclusig    Other medication(s) to be shipped: No additional medications requested for fill at this time     Changes to insurance: No    Delivery Scheduled: Yes, Expected medication delivery date: 06/22/21.  However, Rx request for refills was sent to the provider as there are none remaining.     Medication will be delivered via UPS to the confirmed prescription address in [facility].    The patient will receive a drug information handout for each medication shipped and additional FDA Medication Guides as required.  Verified that patient has previously received a Conservation officer, historic buildings and a Surveyor, mining.    The patient or caregiver noted above participated in the development of this care plan and knows that they can request review of or adjustments to the care plan at any time.      All of the patient's questions and concerns have been addressed.    Breck Coons [facility] Specialty Pharmacist

## 2021-06-18 NOTE — Unmapped (Signed)
Addended by: Viona Gilmore on: 06/18/2021 01:55 PM     Modules accepted: Level of Service

## 2021-06-23 NOTE — Unmapped (Signed)
[facility]    Patient Name: Adam Keith  Patient Age: 41 y.o.  Encounter Date: 06/24/2021    Primary Care Provider:  Mick Sell, MD    Referring Physician:  Unknown Per Patient Referring  No address on file    Reason for visit:  Ph+ B-ALL follow up     Assessment:  A 41 y.o. 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 The course was complicated by septic shock, candida krusei fungemia, MRSA bacteremia with septic emboli c/b acute renal failure and respiratory distress requiring dialysis and intubation.  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  He subsequently had difficulty tolerating single agent dasatinib (as a bridge to planned ponatinib-blinatumomab--had fluid retention and pleural effusion).  He then completed  4 cycles of ponatinib-blinatumomab, initiated for treatment of MRD in the setting of poor tolerance of standard therapy. He has low-level ds with BCR-ABL level of 0.002% on marrow and 0.004% in the blood.     Patient is now on ponatinib 30 mg monotherapy.     He has been stable in the past month, with no new infections or hospitalizations. IVIG was attempted but stopped midway due to infusion reaction. He is open to trying another method to safely infuse IVIG but does not want to receive the full dose in a single outpatient day. We will therefore plan to give a dose over 2 days. This is being arranged with our pharmacy team.     We discussed today the rationale for a bone marrow transplant. He has not been healthy enough to consider this in the past. He was initially reluctant to consider this, however is open to further discussion. His reluctance is due to an experience he had with a friend who went through BMT.     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   - Plan for another attempt IVIG for now split over 2 days - timing to be determined by Drs. Patel and Buhlinger      DRESS: followed by Dr. 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 Now s/p dialysis  - stable Cr appears to have developed CKD due to incompletely healed acute injury.  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 [name] and Dr. Verlan Friends and Dr. Maple Mirza following patient.    - long term doxycycline with goal of decolonization  **Hx Candidemia during induction - now s/p Cresemab  - followed by Dr. [name]  **Hx Covid-19   - symptoms now resolved    Symptom management: bowel regimen as listed in medication list, analgesics as listed in medication list or symptoms reviewed and due to acceptable control, no changes made  **Peripheral neuropathy,  sensory and motor  - Grade 1-2 monitor  **Chronic Back Pain, complicated by hospital stay and procedures  - Followed by Dr. Genice Rouge, Palliative Care  **Depression/anxiety: met with [name], [facility]  - nortriptyline  - PRN clonazepam  - has not been using PRN hydroxyzine    Venous access: I recommend the following change for venous access : PICC line removal      Supportive Care Recommendations:  We recommend based on the patient???s underlying diagnosis and treatment history the following supportive care:      1. Antimicrobial prophylaxis:    - Viral - valacyclovir 500mg  daily  - Bacterial - levofloxacin 500 mg if ANC <0.5  - Fungal - on treatment isavuconazole  - PJP - Bactrim DS BID Sat/Sun  - recommend Annual flu shot - declines thus far  - recommend updated Covid vaccine - declines    2. Blood product support:  I recommend the following intervals for laboratory monitoring to determine transfusion needs and monitor for hematologic effects of therapy and underlying disease: no routine monitoring outside of clinic follow-up    Leukoreduced blood products are required.  Irradiated blood products are preferred, but in case of urgent transfusion needs non-irradiated blood products may be used:     -  RBC transfusion threshold: transfuse 2 units for Hgb < 8 g/dL.  -  Platelet transfusion threshold: transfuse 1 unit of platelets for platelet count < 10, or for bleeding or need for invasive procedure.    3. Hematopoietic growth factor support: none      Care coordination: The next [facility] Follow up requested:  - 2 month followup      These services include the following elements of medical decision making:  addressing a hematologic cancer that poses a threat to life and/or bone marrow function  interpretation of diagnostic test reports or ordering of diagnostic tests and independent interpretation of diagnostic tests  High risk of morbidity due to drug therapy requiring monitoring for toxicity or decisions regarding goals and continuation of antineoplastic therapy    Mariel Aloe, MD  Leukemia Program  Division of Hematology  [facility]      Nurse Navigator (non-clinical trial patients): Elicia Lamp, RN        Tel. [phone]       Fax. [phone]  Toll-free appointments: [phone]  Scheduling assistance: [phone]  After hours/weekends: [phone] (ask for adult hematology/oncology on-call)            History of Present Illness:  We had the pleasure of seeing Adam Keith in the Leukemia Clinic at the [place] [facility] [place] on 06/24/2021.  He is a 41 y.o. male with  Ph+ B-ALL . His oncologic history is as follows:      Oncology History Overview Note   Referring/Local Oncologist: None    Diagnosis:Ph+ ALL    Genetics:    Karyotype/FISH:Abnormal Karyotype: 46,XY,t(9;22)(q34;q11.2)[1]/45,XY,der(7;9)(q10;q10)t(9;22)(q34;q11.2),der(22)t(9;22)[11]/46,sdl,+der(22)t(9;22)[5]/46,XY[3]     Abnormal FISH: A BCR/ABL1 interphase FISH assay shows an abnormal signal pattern in 97% of the 100 cells scored. 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  The findings support a diagnosis of leukemia and have implications for targeted therapy and for monitoring residual disease.      Molecular Genetics:  BCR-ABL1 p210 transcripts were detected at a level of 46.479 IS% ratio in bone marrow.  BCR-ABL1 p190 transcripts were  detected at a level of 4 in 100,000 cells in bone marrow.    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)     01/24/2020 - 04/02/2020 Chemotherapy    IP/OP LEUKEMIA GRAAPH-2005 + RITUXIMAB < 60 YO  rituximab hypercvad (odd and even course)       02/24/2020 Remission    CR with PCR MRD+; marrow showing 70% blasts with <1% blasts, negative flow MRD, BCR-ABL p210 PCR 0.197%; CNS negative for blasts     03/09/2020 Progression    CSF - rare blast ID'ed - IT chemo given    03/13/20 - IT chemo, CSF negative     04/16/2020 Biopsy    BM Bx with 40-50% cellularity, <1% blasts, MRD flow cytometry negative, BCR-ABL p210 transcripts 0.036%     05/28/2020 - 05/28/2020 Chemotherapy    OP AML - CNS THERAPY (INTRATHECAL CYTARABINE, INTRATHECAL METHOTREXATE, OR INTRATHECAL TRIPLE)  Select one of the following: cytarabine IT 100 mg with hydrocortisone 50 mg, cytarabine IT 40 mg with methotrexate 15 mg with hydrocortisone 50 mg, OR methotrexate IT 12 mg with hydrocortisone 50 mg       06/19/2020 -  Chemotherapy    IP/OP LEUKEMIA BLINATUMOMAB 7-DAY INFUSION (MINIMAL RESIDUAL DISEASE; WT >= 22 KG) (HOME INFUSION)      Cycles 1*-4: Blinatumomab 28 mcg/day Days 1-28 of 6-week cycle.  *Given in the inpatient setting on Days 1-3 on Cycle 1 and Days 1-2 on Cycle 2, while other treatment days are given in the outpatient setting.    Cycle 1 MRD Dosing     07/18/2020 Adverse Reaction    Hospitalization: fevers. Pneumonia     07/23/2020 Biopsy    Diagnosis  Bone marrow, right iliac, aspiration and biopsy  -   Normocellular bone marrow (50%) with trilineage hematopoiesis and 1% blasts by manual aspirate differential  -   Flow cytometry MRD analysis reveals no definitive immunophenotypic evidence of residual B lymphoblastic leukemia   - BCR-ABL p210 0.006%         08/10/2020 -  Chemotherapy    Cycle 2 blinatumomab-ponatinib  Ponatinib 30mg     1 IT per cycle     09/21/2020 Adverse Reaction    Covid-19, symptomatic but not requiring hospitalization.     10/06/2020 -  Chemotherapy    Cycle 3 blinatumomab-ponatinib  Ponatinib 30mg        11/01/2020 Adverse Reaction    Hospitalization: MRSA Pneumonia requiring intubation    Blinatumomab stopped.  Ponatinib held until he stabilized.       12/14/2020 -  Chemotherapy    Cycle 4 blinatumomab 28 mcg/day days 1-28, ponatinib 30 mg per day IT triple therapy day 29     01/13/2021 -  Chemotherapy    Ponatinib 30mg  daily  - due to due low level BCR-ABL     03/06/2021 Adverse Reaction    Hospitalization - ICU on ventilator - influenza and MRSA/klebsiella PNA     ALL (acute lymphoblastic leukemia) (CMS-HCC)   06/11/2020 -  Chemotherapy    IP/OP LEUKEMIA BLINATUMOMAB 7-DAY INFUSION (MINIMAL RESIDUAL DISEASE; WT >= 22 KG) (HOME INFUSION)  Cycles 1*-4: Blinatumomab 28 mcg/day Days 1-28 of 6-week cycle.  *Given in the inpatient setting on Days 1-3 on Cycle 1 and Days 1-2 on Cycle 2, while  other treatment days are given in the outpatient setting.       09/21/2020 Initial Diagnosis    ALL (acute lymphoblastic leukemia) (CMS-HCC)     Acute lymphoblastic leukemia in remission (CMS-HCC)   06/11/2020 -  Chemotherapy    IP/OP LEUKEMIA BLINATUMOMAB 7-DAY INFUSION (MINIMAL RESIDUAL DISEASE; WT >= 22 KG) (HOME INFUSION)  Cycles 1*-4: Blinatumomab 28 mcg/day Days 1-28 of 6-week cycle.  *Given in the inpatient setting on Days 1-3 on Cycle 1 and Days 1-2 on Cycle 2, while other treatment days are given in the outpatient setting.       11/09/2020 Initial Diagnosis    Acute lymphoblastic leukemia in remission (CMS-HCC)         Interim History:  Overall doing ok. Still quite fatigued. Not able to put in full days at work. No fevers.     Past Medical, Surgical and Family History were reviewed and pertinent updates were made in the Electronic Medical Record      ECOG Performance Status: 1    Medications:      Current Outpatient Medications   Medication Sig Dispense Refill   ??? acetaminophen (TYLENOL) 325 MG tablet Take 2 tablets (650 mg total) by mouth every six (6) hours as needed for pain.     ??? albuterol HFA 90 mcg/actuation inhaler Inhale 2 puffs every six (6) hours as needed for wheezing. 8 g 11   ??? amLODIPine (NORVASC) 10 MG tablet Take 1 tablet (10 mg total) by mouth daily. 30 tablet 0   ??? arm brace (WRIST BRACE) Misc 1 Piece by Miscellaneous route as needed. left ulnar wrist brace 1 each 0   ??? aspirin 81 MG chewable tablet Chew 1 tablet (81 mg total) daily. 36 tablet 11   ??? carvediloL (COREG) 12.5 MG tablet Take 1 tablet (12.5 mg total) by mouth Two (2) times a day. 180 tablet 1   ??? chlorhexidine (HIBICLENS) 4 % external liquid Apply topically daily. as directed 946 mL 0   ??? clonazePAM (KLONOPIN) 0.5 MG tablet Take 1 tablet (0.5 mg total) by mouth two (2) times a day as needed for anxiety or sleep. 60 tablet 1   ??? doxycycline (VIBRA-TABS) 100 MG tablet Take 1 tablet (100 mg total) by mouth Two (2) times a day. 180 tablet 3   ??? gabapentin (NEURONTIN) 300 MG capsule Take 2 capsules (600mg ) by mouth in the morning and 3 capsules (900mg ) at bedtime. May cause drowsiness. 150 capsule 5   ??? HYDROmorphone (DILAUDID) 2 MG tablet Take 1 tablet (2 mg total) by mouth two (2) times a day as needed. 56 tablet 0   ??? mucus clearing device Devi 1 each by Miscellaneous route Two (2) times a day. 1 each 0   ??? mupirocin (BACTROBAN) 2 % ointment Apply topically daily. apply in each nostril prior to bed, as directed 15 g 0   ??? nortriptyline (PAMELOR) 50 MG capsule Take 2 capsules (100 mg total) by mouth nightly.     ??? OXYCONTIN 15 mg 12 hr crush resistant ER/CR tablet Take 1 tablet (15 mg total) by mouth Two (2) times a day. 56 tablet 0   ??? PONATinib (ICLUSIG) 30 mg tablet Take 1 tablet (30 mg total) by mouth daily. Swallow tablets whole. Do not crush, break, cut or chew tablets. 30 tablet 5   ??? sulfamethoxazole-trimethoprim (BACTRIM DS) 800-160 mg per tablet Take 1 tablet (160 mg of trimethoprim total) by mouth 2 times a day on Saturday, Sunday. For  prophylaxis while on chemo. 48 tablet 3   ??? umeclidinium-vilanteroL (ANORO ELLIPTA) 62.5-25 mcg/actuation inhaler Inhale 1 puff daily. 60 each 11   ??? valACYclovir (VALTREX) 500 MG tablet Take 1 tablet (500 mg total) by mouth daily. 90 tablet 11   ??? mirtazapine (REMERON) 15 MG tablet TAKE 1 TABLET BY MOUTH EVERY DAY AT NIGHT 90 tablet 4     No current facility-administered medications for this visit.       Vital Signs:  Vitals:    06/24/21 0833   BP: 130/90   Pulse: 80   Resp: 18   Temp: 36.3 ??C (97.3 ??F)   SpO2: 93%       Relevant Physical Exam Findings:  General: Resting in no apparent distress, accompanied by spouse  HEENT:  Clear sclera, conjunctiva, mask in place  CARDAC: WWP  RESP: nonlabored  GI: non-distended  NEURO: alert, and oriented x4, steady gait, no focal deficits  PSYCH: appropriate  DERM: no visible rashes, lesions        Relevant Laboratory, radiology and pathology results:  I personally viewed the most recent internal records, laboratory results and hematopathology report  and discussed the available results with the patient and family  A summary of results follows:  Lab on 06/24/2021   Component Date Value Ref Range Status   ??? Magnesium 06/24/2021 1.7  1.6 - 2.6 mg/dL Final   ??? Collection 06/24/2021 Collected   Final   ??? WBC 06/24/2021 12.1 (H)  3.6 - 11.2 10*9/L Final   ??? RBC 06/24/2021 5.12  4.26 - 5.60 10*12/L Final   ??? HGB 06/24/2021 15.1  12.9 - 16.5 g/dL Final   ??? HCT 16/01/9603 44.1  39.0 - 48.0 % Final   ??? MCV 06/24/2021 86.1  77.6 - 95.7 fL Final   ??? MCH 06/24/2021 29.6  25.9 - 32.4 pg Final   ??? MCHC 06/24/2021 34.3  32.0 - 36.0 g/dL Final   ??? RDW 54/12/8117 15.5 (H)  12.2 - 15.2 % Final   ??? MPV 06/24/2021 8.1  6.8 - 10.7 fL Final   ??? Platelet 06/24/2021 253  150 - 450 10*9/L Final   ??? Neutrophils % 06/24/2021 57.9  % Final   ??? Lymphocytes % 06/24/2021 27.4  % Final   ??? Monocytes % 06/24/2021 7.2  % Final   ??? Eosinophils % 06/24/2021 6.2  % Final   ??? Basophils % 06/24/2021 1.3  % Final   ??? Absolute Neutrophils 06/24/2021 7.0  1.8 - 7.8 10*9/L Final   ??? Absolute Lymphocytes 06/24/2021 3.3  1.1 - 3.6 10*9/L Final   ??? Absolute Monocytes 06/24/2021 0.9 (H)  0.3 - 0.8 10*9/L Final   ??? Absolute Eosinophils 06/24/2021 0.8 (H)  0.0 - 0.5 10*9/L Final   ??? Absolute Basophils 06/24/2021 0.2 (H)  0.0 - 0.1 10*9/L Final   ??? Case Report 06/24/2021    Final                    Value:Molecular Genetics Report                         Case: [id number]                                 Authorizing Provider:  Lenon Ahmadi,     Collected:           06/24/2021 3086  AGNP                                                                         Ordering Location:     [facility] ADULT ONCOLOG[facility]    Received:            06/24/2021 0823                                     [place] Pathologist:           Prudencio Burly, MD                                                      Specimen:    Blood                                                                                     ??? Specimen Type 06/24/2021    Final                    Value:Blood   ??? BCR/ABL1 p210 Assay 06/24/2021 Negative   Final   ??? BCR/ABL1 p210 Assay Results 06/24/2021    Final                    Value:This result contains rich text formatting which cannot be displayed here.   ??? Total IgG 06/24/2021 292 (L)  646 - 2,013 mg/dL Final     Chest X-ray PA and lateral  Increased interstitial edema versus atypical infection/inflammation.     Small right pleural effusion.    Diagnosis   Date Value Ref Range Status   03/16/2021   Final    A. Lung, right upper lobe, bronchoalveolar lavage:  - No cytologically malignant cells identified  - Abundant neutrophils present    This electronic signature is attestation that the pathologist personally reviewed the submitted material(s) and the final diagnosis reflects that evaluation.         Diagnosis   Date Value Ref Range Status   03/16/2021   Final    A. Lung, right upper lobe, bronchoalveolar lavage:  - No cytologically malignant cells identified  - Abundant neutrophils present    This electronic signature is attestation that the pathologist personally reviewed the submitted material(s) and the final diagnosis reflects that evaluation.       01/11/2021   Final    A:  Cerebrospinal fluid, cytospin review and flow cytometry   -  No blasts identified by morphology or flow cytometric analysis    This electronic signature is attestation that the pathologist personally reviewed the submitted material(s) and the final diagnosis  reflects that evaluation.       01/11/2021   Final    Bone marrow, left iliac, aspiration and biopsy  -  Normocellular bone marrow (30% overall) with trilineage hematopoiesis and less than 1% blasts by manual aspirate differential  -   Flow cytometry MRD analysis reveals no definitive immunophenotypic evidence of residual B lymphoblastic leukemia (see Comment)     -  See linked reports for associated Ancillary Studies.      This electronic signature is attestation that the pathologist personally reviewed the submitted material(s) and the final diagnosis reflects that evaluation.

## 2021-06-24 ENCOUNTER — Other Ambulatory Visit: Admit: 2021-06-24 | Discharge: 2021-06-24 | Payer: MEDICAID

## 2021-06-24 ENCOUNTER — Ambulatory Visit: Admit: 2021-06-24 | Discharge: 2021-06-24 | Payer: MEDICAID

## 2021-06-24 ENCOUNTER — Ambulatory Visit
Admit: 2021-06-24 | Discharge: 2021-06-24 | Payer: MEDICAID | Attending: Physical Medicine & Rehabilitation | Primary: Physical Medicine & Rehabilitation

## 2021-06-24 ENCOUNTER — Ambulatory Visit: Admit: 2021-06-24 | Discharge: 2021-06-24 | Payer: MEDICAID | Attending: Hematology | Primary: Hematology

## 2021-06-24 DIAGNOSIS — C9101 Acute lymphoblastic leukemia, in remission: Principal | ICD-10-CM

## 2021-06-24 DIAGNOSIS — C9100 Acute lymphoblastic leukemia not having achieved remission: Principal | ICD-10-CM

## 2021-06-24 LAB — IGG: GAMMAGLOBULIN; IGG: 292 mg/dL — ABNORMAL LOW (ref 646–2013)

## 2021-06-24 LAB — CBC W/ AUTO DIFF
BASOPHILS ABSOLUTE COUNT: 0.2 10*9/L — ABNORMAL HIGH (ref 0.0–0.1)
BASOPHILS RELATIVE PERCENT: 1.3 %
EOSINOPHILS ABSOLUTE COUNT: 0.8 10*9/L — ABNORMAL HIGH (ref 0.0–0.5)
EOSINOPHILS RELATIVE PERCENT: 6.2 %
HEMATOCRIT: 44.1 % (ref 39.0–48.0)
HEMOGLOBIN: 15.1 g/dL (ref 12.9–16.5)
LYMPHOCYTES ABSOLUTE COUNT: 3.3 10*9/L (ref 1.1–3.6)
LYMPHOCYTES RELATIVE PERCENT: 27.4 %
MEAN CORPUSCULAR HEMOGLOBIN CONC: 34.3 g/dL (ref 32.0–36.0)
MEAN CORPUSCULAR HEMOGLOBIN: 29.6 pg (ref 25.9–32.4)
MEAN CORPUSCULAR VOLUME: 86.1 fL (ref 77.6–95.7)
MEAN PLATELET VOLUME: 8.1 fL (ref 6.8–10.7)
MONOCYTES ABSOLUTE COUNT: 0.9 10*9/L — ABNORMAL HIGH (ref 0.3–0.8)
MONOCYTES RELATIVE PERCENT: 7.2 %
NEUTROPHILS ABSOLUTE COUNT: 7 10*9/L (ref 1.8–7.8)
NEUTROPHILS RELATIVE PERCENT: 57.9 %
PLATELET COUNT: 253 10*9/L (ref 150–450)
RED BLOOD CELL COUNT: 5.12 10*12/L (ref 4.26–5.60)
RED CELL DISTRIBUTION WIDTH: 15.5 % — ABNORMAL HIGH (ref 12.2–15.2)
WBC ADJUSTED: 12.1 10*9/L — ABNORMAL HIGH (ref 3.6–11.2)

## 2021-06-24 LAB — MAGNESIUM: MAGNESIUM: 1.7 mg/dL (ref 1.6–2.6)

## 2021-06-24 NOTE — Unmapped (Unsigned)
Pt declined PIV placement at Lab appt.  Labs drawn via butterfly & sent for analysis.  To next appt.  Care provided by Moishe Spice RN.

## 2021-06-24 NOTE — Unmapped (Signed)
Nice to see you today.     We discussed the following:      1. ALL - You are doing much better than when I last saw you. Keep up the good work. We will work on arranging the IVIG.     Mariel Aloe, MD  Leukemia Program       Nurse Navigator (non-clinical trial patients): Elicia Lamp, RN        Tel. [phone]       Fax. [phone]  Toll-free appointments: [phone]  Scheduling assistance: [phone]  After hours/weekends: [phone] (ask for adult hematology/oncology on-call)      Lab Results   Component Value Date    WBC 12.1 (H) 06/24/2021    HGB 15.1 06/24/2021    HCT 44.1 06/24/2021    PLT 253 06/24/2021       Lab Results   Component Value Date    NA 141 06/04/2021    K 4.1 06/04/2021    CL 108 (H) 06/04/2021    CO2 23.0 06/04/2021    BUN 17 06/04/2021    CREATININE 1.78 (H) 06/04/2021    GLU 93 06/04/2021    CALCIUM 9.4 06/04/2021    MG 1.7 06/24/2021    PHOS 3.5 03/20/2021       Lab Results   Component Value Date    BILITOT 0.2 (L) 06/04/2021    BILIDIR 0.30 03/20/2021    PROT 6.6 06/04/2021    ALBUMIN 4.2 06/04/2021    ALT 20 06/04/2021    AST 23 06/04/2021    ALKPHOS 85 06/04/2021       Lab Results   Component Value Date    INR 0.84 12/01/2020    APTT 38.0 (H) 04/15/2020

## 2021-06-24 NOTE — Unmapped (Unsigned)
Physical Medicine and Rehab  Clinic Note          Chief Complaint: ***    History of Present Illness: Adam Keith is a 41 y.o. 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    Per review of EMR, he has had ongoing leg pain interfering with sleep. He has seen palliative care and reported ~1 month of RLE pain and his gabapentin was increased. No changes were made to opiate medications and he was also recommended for DVT work up, which was negative.     Today patient ***  Pain:  Nutrition:  Bladder:  Bowel:  Skin:    Cancer History:  Oncology History Overview Note   Referring/Local Oncologist: None    Diagnosis:Ph+ ALL    Genetics:    Karyotype/FISH:Abnormal Karyotype: 46,XY,t(9;22)(q34;q11.2)[1]/45,XY,der(7;9)(q10;q10)t(9;22)(q34;q11.2),der(22)t(9;22)[11]/46,sdl,+der(22)t(9;22)[5]/46,XY[3]     Abnormal FISH: A BCR/ABL1 interphase FISH assay shows an abnormal signal pattern in 97% of the 100 cells scored. 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  The findings support a diagnosis of leukemia and have implications for targeted therapy and for monitoring residual disease.      Molecular Genetics:  BCR-ABL1 p210 transcripts were detected at a level of 46.479 IS% ratio in bone marrow.  BCR-ABL1 p190 transcripts were detected at a level of 4 in 100,000 cells in bone marrow.    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)     01/24/2020 - 04/02/2020 Chemotherapy    IP/OP LEUKEMIA GRAAPH-2005 + RITUXIMAB < 60 YO  rituximab hypercvad (odd and even course)     02/24/2020 Remission    CR with PCR MRD+; marrow showing 70% blasts with <1% blasts, negative flow MRD, BCR-ABL p210 PCR 0.197%; CNS negative for blasts     03/09/2020 Progression    CSF - rare blast ID'ed - IT chemo given    03/13/20 - IT chemo, CSF negative     04/16/2020 Biopsy    BM Bx with 40-50% cellularity, <1% blasts, MRD flow cytometry negative, BCR-ABL p210 transcripts 0.036%     05/28/2020 - 05/28/2020 Chemotherapy    OP AML - CNS THERAPY (INTRATHECAL CYTARABINE, INTRATHECAL METHOTREXATE, OR INTRATHECAL TRIPLE)  Select one of the following: cytarabine IT 100 mg with hydrocortisone 50 mg, cytarabine IT 40 mg with methotrexate 15 mg with hydrocortisone 50 mg, OR methotrexate IT 12 mg with hydrocortisone 50 mg     06/19/2020 -  Chemotherapy    IP/OP LEUKEMIA BLINATUMOMAB 7-DAY INFUSION (MINIMAL RESIDUAL DISEASE; WT >= 22 KG) (HOME INFUSION)      Cycles 1*-4: Blinatumomab 28 mcg/day Days 1-28 of 6-week cycle.  *Given in the inpatient setting on Days 1-3 on Cycle 1 and Days 1-2 on Cycle 2, while other treatment days are given in the outpatient setting.    Cycle 1 MRD Dosing     07/18/2020 Adverse Reaction    Hospitalization: fevers. Pneumonia     07/23/2020 Biopsy    Diagnosis  Bone marrow, right iliac, aspiration and biopsy  -   Normocellular bone marrow (50%) with trilineage hematopoiesis and 1% blasts by manual aspirate differential  -   Flow cytometry  MRD analysis reveals no definitive immunophenotypic evidence of residual B lymphoblastic leukemia   - BCR-ABL p210 0.006%         08/10/2020 -  Chemotherapy    Cycle 2 blinatumomab-ponatinib  Ponatinib 30mg     1 IT per cycle     09/21/2020 Adverse Reaction    Covid-19, symptomatic but not requiring hospitalization.     10/06/2020 -  Chemotherapy    Cycle 3 blinatumomab-ponatinib  Ponatinib 30mg        11/01/2020 Adverse Reaction    Hospitalization: MRSA Pneumonia requiring intubation    Blinatumomab stopped.  Ponatinib held until he stabilized.       12/14/2020 -  Chemotherapy    Cycle 4 blinatumomab 28 mcg/day days 1-28, ponatinib 30 mg per day IT triple therapy day 29     01/13/2021 -  Chemotherapy    Ponatinib 30mg  daily  - due to due low level BCR-ABL     03/06/2021 Adverse Reaction    Hospitalization - ICU on ventilator - influenza and MRSA/klebsiella PNA     ALL (acute lymphoblastic leukemia) (CMS-HCC)   06/11/2020 -  Chemotherapy    IP/OP LEUKEMIA BLINATUMOMAB 7-DAY INFUSION (MINIMAL RESIDUAL DISEASE; WT >= 22 KG) (HOME INFUSION)  Cycles 1*-4: Blinatumomab 28 mcg/day Days 1-28 of 6-week cycle.  *Given in the inpatient setting on Days 1-3 on Cycle 1 and Days 1-2 on Cycle 2, while other treatment days are given in the outpatient setting.     09/21/2020 Initial Diagnosis    ALL (acute lymphoblastic leukemia) (CMS-HCC)     Acute lymphoblastic leukemia in remission (CMS-HCC)   06/11/2020 -  Chemotherapy    IP/OP LEUKEMIA BLINATUMOMAB 7-DAY INFUSION (MINIMAL RESIDUAL DISEASE; WT >= 22 KG) (HOME INFUSION)  Cycles 1*-4: Blinatumomab 28 mcg/day Days 1-28 of 6-week cycle.  *Given in the inpatient setting on Days 1-3 on Cycle 1 and Days 1-2 on Cycle 2, while other treatment days are given in the outpatient setting.     11/09/2020 Initial Diagnosis    Acute lymphoblastic leukemia in remission (CMS-HCC)         Medical / Surgical History:   Past Medical History:   Diagnosis Date   ??? 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 BONE MARROW BIOPSY & ASPIRATION  01/21/2020        ??? CHG [place], CHEST,REAL TIME  11/20/2020    Procedure: ULTRASOUND, CHEST, REAL TIME WITH IMAGE DOCUMENTATION;  Surgeon: Jerelyn Charles, MD;  Location: [facility]ON[facility] PROCEDURE LAB [facility];  Service: Pulmonary   ??? IR INSERT PORT AGE GREATER THAN 5 YRS  03/10/2020    IR INSERT PORT AGE GREATER THAN 5 YRS 03/10/2020 [name], MD IMG VIR [facility]   ??? PR BRONCHOSCOPY,DIAGNOSTIC W LAVAGE Bilateral 07/20/2020    Procedure: BRONCHOSCOPY, RIGID OR FLEXIBLE, INCLUDE FLUOROSCOPIC GUIDANCE WHEN PERFORMED; W/BRONCHIAL ALVEOLAR LAVAGE WITH MODERATE SEDATION;  Surgeon: Dellis Filbert, MD;  Location: [facility]ON[facility] PROCEDURE LAB [facility];  Service: Pulmonary        Social History:   Social History     Tobacco Use   ??? Smoking status: Every Day     Packs/day: 2.00     Types: Cigarettes     Last attempt to quit: 01/20/2020     Years since quitting: 1.4   ??? Smokeless tobacco: Former     Types: Musician Use   ??? Vaping  Use: Never used   Substance Use Topics   ??? Alcohol use: Not Currently            Family History: Reviewed and non-contributory to rehab needs  family history is not on file.    Allergies:   Bupropion hcl, Cefepime, Ceftaroline fosamil, Dapsone, Onion, Vancomycin analogues, Bismuth subsalicylate, Privigen [immun glob g(igg)-pro-iga 0-50], and Furosemide    Medications:   Current Outpatient Medications   Medication Instructions   ??? acetaminophen (TYLENOL) 650 mg, Oral, Every 6 hours PRN   ??? albuterol HFA 90 mcg/actuation inhaler 2 puffs, Inhalation, Every 6 hours PRN   ??? amLODIPine (NORVASC) 10 mg, Oral, Daily (standard)   ??? arm brace (WRIST BRACE) Misc 1 Piece, Miscellaneous, As needed (once a day), left ulnar wrist brace   ??? aspirin 81 MG chewable tablet Chew 1 tablet (81 mg total) daily.   ??? carvediloL (COREG) 12.5 mg, Oral, 2 times a day (standard)   ??? chlorhexidine (HIBICLENS) 4 % external liquid Topical, Daily, as directed   ??? clonazePAM (KLONOPIN) 0.5 mg, Oral, 2 times a day PRN   ??? doxycycline (VIBRA-TABS) 100 mg, Oral, 2 times a day (standard)   ??? gabapentin (NEURONTIN) 300 MG capsule Take 2 capsules (600mg ) by mouth in the morning and 3 capsules (900mg ) at bedtime. May cause drowsiness.   ??? HYDROmorphone (DILAUDID) 2 mg, Oral, 2 times a day PRN   ??? mirtazapine (REMERON) 15 mg, Oral, Nightly   ??? mucus clearing device Devi 1 each, Miscellaneous, 2 times a day (standard)   ??? mupirocin (BACTROBAN) 2 % ointment Topical, Daily (standard), apply in each nostril prior to bed, as directed   ??? nortriptyline (PAMELOR) 100 mg, Oral, Nightly   ??? OxyCONTIN 15 mg, Oral, 2 times a day (standard)   ??? PONATinib (ICLUSIG) 30 mg tablet Take 1 tablet (30 mg total) by mouth daily. Swallow tablets whole. Do not crush, break, cut or chew tablets.   ??? sulfamethoxazole-trimethoprim (BACTRIM DS) 800-160 mg per tablet 160 mg of trimethoprim, Oral, 2 times a day on saturday, sunday, For prophylaxis while on chemo.   ??? umeclidinium-vilanteroL (ANORO ELLIPTA) 62.5-25 mcg/actuation inhaler 1 puff, Inhalation, Daily (standard)   ??? valACYclovir (VALTREX) 500 MG tablet Take 1 tablet (500 mg total) by mouth daily.         Review of Systems:    General ROS:   {ros baratta:67414::Full 10 systems reviewed and neg, unless noted in HPI}  OBJECTIVE:     Vitals:  There were no vitals filed for this visit.      Physical Exam:    GEN: {jmb exam gen:67762::Lying in bed in NAD.}  HEENT: {jmb exam heent:68258::Atraumatic.,Normocephalic.,Moist mucous membranes.,Trachea midline.}  RESP: {jmb exam resp:68259::NWOB on RA.}  CV: RRR *** edema  GI: abd soft, NTND  GU: *** Foley   SKIN: {jmb skin:67761::no rashes or ecchymoses on exposed skin}  MSK:  no notable contractures, no visible swelling or erythema over joints, joints NTTP  NEURO:  Mental Status: A&Ox***, attention ***, speech ***fluid and coherent, follows commands ***  Cranial Nerve:   ***2nd Cranial Nerve:  Intact Vision. 3rd, 4th, 6th Cranial Nerves:  Pupils equal, round, reactive to light; full extraocular movements. 5th Cranial Nerve: normal facial sensation. 7th Cranial Nerve: Full and symmetrical facial movement. 8th Cranial Nerve: Hears finger rub well bilaterally. 9th, 10th Cranial Nerves: Symmetrical palate 11th Cranial nerve: Normal trapezius and sternocleidomastoid. 12th Cranial nerve: Normal tongue movement.   {JMBCranial Nerves:61183::visual acuity intact, no visual  fields deficits, pupils equal, EOMI, facial sensation bilaterally, no facial droop, hearing grossly intact b/l, shoulder shrug full and equal, tongue protrudes midline}  Sensory: BUE and BLE sensation intact to light touch***  Motor:     RUE/LUE: shoulder abd ***/***, biceps ***/***, triceps ***/***, wrist extension ***/***, hand grasp ***/***    RLE/LLE: hip flexion ***/***, knee extension ***/***, DF ***/***, 1st toe extension ***/*** PF ***/***  Tone: within normal limits, no spasticity noted  Reflexes: {jmb reflexes:67763::no clonus}  Cerebellar: no abnml or extraneous mvmts, FNF wnl no dysmetria,*** no pronator drift  PSYCH: mood euthymic, affect appropriate, thought process logical***     Labs and Diagnostic Studies: Reviewed ***  CBC -   Results in Past 2 Days  Result Component Current Result   WBC 12.1 (H) (06/24/2021)   RBC 5.12 (06/24/2021)   HGB 15.1 (06/24/2021)   HCT 44.1 (06/24/2021)   MCV 86.1 (06/24/2021)   MCH 29.6 (06/24/2021)   MCHC 34.3 (06/24/2021)   MPV 8.1 (06/24/2021)   Platelet 253 (06/24/2021)     BMP -   No results found for requested labs within last 2 days.     Coagulation -   No results found for requested labs within last 2 days.     Cardiac markers -   No results found for requested labs within last 2 days.     LFT's -   No results found for requested labs within last 2 days.       Radiology Results: Personally reviewed ***    Doppler RLE 06/04/21  Final Interpretation  Right  There is no evidence of DVT in the lower extremity. However, portions of this examination were limited- see technologist comments above. There is no evidence of obstruction proximal to the inguinal ligament or in the common femoral vein.  Left  There is no evidence of obstruction proximal to the inguinal ligament or in the common femoral vein.    ASSESSMENT / RECOMMENDATIONS:     Padraic Marinos is a 41 y.o. 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    Functional Impairment(s) and Rehabilitation Plan of Care    1. RLE pain    _______________________________________________  Corrin Parker, MD  Director of Cancer Rehabilitation  Department of Physical Medicine and Rehabilitation  [facility]    I personally spent *** minutes face-to-face and non-face-to-face in the care of this patient, which includes all pre, intra, and post visit time on the date of service.

## 2021-06-25 DIAGNOSIS — G4700 Insomnia, unspecified: Principal | ICD-10-CM

## 2021-06-25 DIAGNOSIS — F332 Major depressive disorder, recurrent severe without psychotic features: Principal | ICD-10-CM

## 2021-06-25 DIAGNOSIS — F419 Anxiety disorder, unspecified: Principal | ICD-10-CM

## 2021-06-25 MED ORDER — MIRTAZAPINE 15 MG TABLET
ORAL_TABLET | 4 refills | 0 days
Start: 2021-06-25 — End: ?

## 2021-06-28 ENCOUNTER — Telehealth: Admit: 2021-06-28 | Discharge: 2021-06-29 | Payer: MEDICAID

## 2021-06-28 DIAGNOSIS — F4310 Post-traumatic stress disorder, unspecified: Principal | ICD-10-CM

## 2021-06-28 DIAGNOSIS — F330 Major depressive disorder, recurrent, mild: Principal | ICD-10-CM

## 2021-06-28 DIAGNOSIS — F419 Anxiety disorder, unspecified: Principal | ICD-10-CM

## 2021-06-28 DIAGNOSIS — G4700 Insomnia, unspecified: Principal | ICD-10-CM

## 2021-06-28 MED ORDER — MIRTAZAPINE 15 MG TABLET
ORAL_TABLET | 4 refills | 0 days | Status: CP
Start: 2021-06-28 — End: ?

## 2021-06-28 NOTE — Unmapped (Signed)
Comprehensive Cancer Support Program (CCSP) - Psychiatry Outpatient Clinic   After Visit Summary    It was a pleasure to see you today in the [facility]???s Comprehensive Cancer Support Program (CCSP). The CCSP is a multidisciplinary program dedicated to helping patients, caregivers, and families with cancer treatment, recovery and survivorship.      To schedule, cancel, or change your appointment:  Please call the [facility] schedulers at [phone], Monday through Friday 8AM - 5PM.  Someone will return your call within 24 hours.      If you have a question about your medicines or you need to contact your provider:  Please call the CCSP program coordinator, Myrene Galas, at [phone].     For after hours urgent issues, you may call [phone] or call the I need to talk line at [phone] anytime 24/7.    CCSP Patient and Family Resource Center: [phone].    CCSP Website:  [email or web address]    For prescription refills, please allow at least 24 hours (during business hours, M-F) for providers to call in refills to your pharmacy. We are generally unable to accommodate same-day requests for refills.     If you are taking any controlled substances (such as anxiety or sleep medications), you must use them as the directions say to use them. We generally do not provide early refills over the phone without clear reason, and it would be inappropriate to obtain the medications from other doctors. We routinely use the [place] controlled substance database to monitor prescription drug use.

## 2021-06-28 NOTE — Unmapped (Signed)
Spoke to Adam Keith regarding IVIG infusions. He has a history of infusion related reactions to IVIG but team would like to proceed with low IgA (Gammagard) 0.4 g/kg split over 3 doses (3 infusions total and NO rate titration - will proceed with flat rate of 0.3 mL/kg/hr). He is aware that team will coordinate these appts. First infusion will be 07/08/21.

## 2021-06-28 NOTE — Unmapped (Signed)
[facility]  Psychiatry   Established Patient E&M Service - Outpatient       Assessment:    Adam Keith presents for follow-up evaluation. He states that his mood, anxiety, and sleep have all improved after starting the mirtazapine. Continuing his nortriptyline 100 mg, mirtazapine 15 mg, and clonazepam 0.5 mg. Will reassess on 08/13/21.     Identifying Information:  Adam Keith is a 41 y.o. male with a history of PH+ B cell ALL, recurrent major depressive disorder, PTSD, anxiety, and insomnia.??He has had a long struggle with intermittent depressive episodes with one suicide attempt at age 53.??    Risk Assessment:  An assessment of suicide and violence risk factors was performed as part of this evaluation and is not significantly changed from the last visit.   While future psychiatric events cannot be accurately predicted, the patient does not currently require acute inpatient psychiatric care and does not currently meet [facility] involuntary commitment criteria.      Plan:    Problem 1:??Major Depressive Disorder  Status of problem:??chronic with mild exacerbation  Interventions:??  ??? Continue??nortriptyline??100??mg??at bedtime.   ??? Monitor nortriptyline level given elevated creatinine levels.??Nortriptyline level drawn on 12/21/20 was 66 (Ref range 70-170).??  ??? Continue mirtazapine 15 mg at bedtime??  ??? Continue psychotherapy with local therapist  ??  Problem 2:??Anxiety  Status of problem:??chronic with mild exacerbation  Interventions:??see MDD above  ??  Problem 3:??Insomnia  Status of problem:????chronic and stable  Interventions:??  ??? Continue clonazepam 0.5 mg at bedtime. Last filled on 04/19/21 per PDMP review.  ??? See mirtazapine plan above      Psychotherapy provided:  No billable psychotherapy service provided.    Patient has been given this writer's contact information as well as the [facility] urgent line number. The patient has been instructed to call 911 for emergencies.      Subjective:    Chief complaint:  Follow-up psychiatric evaluation for depression, anxiety, and sleep    Interval History:   Patient reporting improvement in his mood and anxiety after starting the mirtazapine 15 mg at bedtime. No issues with tolerating this medication. States that he still has some bad days, but overall his mood has improved (confirmed by his wife). Sleeping well at night--about 10 hours per night. Still struggles to fall asleep at times. Denies any thoughts of suicide.    Patient still finds that he does not do as well when his wife is working and he doesn't feel like that will ever change.     Still meeting with his therapist. He had been going weekly, but doesn't feel like he needs it quite as often any longer. Now he is going as needed. He recognizes that he will need to meet with her after he meets with the transplant team, so he has arranged to meet with her at that time.    Prior Psychiatric Medication Trials: bupropion (could not tolerate), duloxetine, mirtazapine (given during recent hospitalization), lorazepam    Psychiatric Review of Systems  ??? Depressive Symptoms: anhedonia, depressed mood, difficulty concentrating, fatigue, feelings of worthlessness/guilt, hopelessness, insomnia and passive suicidal thoughts without intent or plan  ??? Manic Symptoms: N/A  ??? Psychosis Symptoms: N/A  ??? Anxiety Symptoms: difficulty controlling worry, easily fatigued, difficulty concentrating, difficulty falling asleep   ??? Panic Symptoms: denies  ??? PTSD Symptoms: no recent issues  ??? Sleep disturbance: difficulty falling asleep and non-restful sleep   ??? Exercise: Unable to engage in a daily  exercise routine.       Objective:      Mental Status Exam:  Appearance:    Appears stated age, Well nourished and Clean/Neat   Motor:   No abnormal movements   Speech/Language:    Normal rate, volume, tone, fluency   Mood:   I'm good   Affect:   Calm, Cooperative and Euthymic   Thought process and Associations:   Logical, linear, clear, coherent, goal directed   Abnormal/psychotic thought content:     Denies SI, HI, self harm, delusions, obsessions, paranoid ideation, or ideas of reference   Perceptual disturbances:     Denies auditory and visual hallucinations, behavior not concerning for response to internal stimuli     Other:   insight and judgment intact     I personally spent 35 minutes face-to-face and non-face-to-face in the care of this patient, which includes all pre, intra, and post visit time on the date of service.    Visit was completed by video (or phone) and the appropriate disclaimer has been included below.    The patient reports they are currently: at home. I spent 20 minutes on the real-time audio and video with the patient on the date of service. I spent an additional 15 minutes on pre- and post-visit activities on the date of service.     The patient was physically located in [place] or a state in which I am permitted to provide care. The patient and/or parent/guardian understood that s/he may incur co-pays and cost sharing, and agreed to the telemedicine visit. The visit was reasonable and appropriate under the circumstances given the patient's presentation at the time.    The patient and/or parent/guardian has been advised of the potential risks and limitations of this mode of treatment (including, but not limited to, the absence of in-person examination) and has agreed to be treated using telemedicine. The patient's/patient's family's questions regarding telemedicine have been answered.     If the visit was completed in an ambulatory setting, the patient and/or parent/guardian has also been advised to contact their provider???s office for worsening conditions, and seek emergency medical treatment and/or call 911 if the patient deems either necessary.      Sheran Lawless, PMHNP  06/28/2021

## 2021-07-08 ENCOUNTER — Ambulatory Visit: Admit: 2021-07-08 | Discharge: 2021-07-09 | Payer: MEDICAID

## 2021-07-08 ENCOUNTER — Other Ambulatory Visit: Admit: 2021-07-08 | Discharge: 2021-07-09 | Payer: MEDICAID

## 2021-07-08 DIAGNOSIS — C9100 Acute lymphoblastic leukemia not having achieved remission: Principal | ICD-10-CM

## 2021-07-08 LAB — CBC W/ AUTO DIFF
BASOPHILS ABSOLUTE COUNT: 0.1 10*9/L (ref 0.0–0.1)
BASOPHILS RELATIVE PERCENT: 0.6 %
EOSINOPHILS ABSOLUTE COUNT: 0.8 10*9/L — ABNORMAL HIGH (ref 0.0–0.5)
EOSINOPHILS RELATIVE PERCENT: 6.8 %
HEMATOCRIT: 43.9 % (ref 39.0–48.0)
HEMOGLOBIN: 15.2 g/dL (ref 12.9–16.5)
LYMPHOCYTES ABSOLUTE COUNT: 2.5 10*9/L (ref 1.1–3.6)
LYMPHOCYTES RELATIVE PERCENT: 22.6 %
MEAN CORPUSCULAR HEMOGLOBIN CONC: 34.6 g/dL (ref 32.0–36.0)
MEAN CORPUSCULAR HEMOGLOBIN: 29.7 pg (ref 25.9–32.4)
MEAN CORPUSCULAR VOLUME: 86 fL (ref 77.6–95.7)
MEAN PLATELET VOLUME: 8.4 fL (ref 6.8–10.7)
MONOCYTES ABSOLUTE COUNT: 0.9 10*9/L — ABNORMAL HIGH (ref 0.3–0.8)
MONOCYTES RELATIVE PERCENT: 7.8 %
NEUTROPHILS ABSOLUTE COUNT: 6.9 10*9/L (ref 1.8–7.8)
NEUTROPHILS RELATIVE PERCENT: 62.2 %
PLATELET COUNT: 240 10*9/L (ref 150–450)
RED BLOOD CELL COUNT: 5.11 10*12/L (ref 4.26–5.60)
RED CELL DISTRIBUTION WIDTH: 15.5 % — ABNORMAL HIGH (ref 12.2–15.2)
WBC ADJUSTED: 11.1 10*9/L (ref 3.6–11.2)

## 2021-07-08 LAB — COMPREHENSIVE METABOLIC PANEL
ALBUMIN: 4.1 g/dL (ref 3.4–5.0)
ALKALINE PHOSPHATASE: 81 U/L (ref 46–116)
ALT (SGPT): 42 U/L (ref 10–49)
ANION GAP: 10 mmol/L (ref 5–14)
AST (SGOT): 28 U/L (ref <=34)
BILIRUBIN TOTAL: 0.2 mg/dL — ABNORMAL LOW (ref 0.3–1.2)
BLOOD UREA NITROGEN: 18 mg/dL (ref 9–23)
BUN / CREAT RATIO: 10
CALCIUM: 8.9 mg/dL (ref 8.7–10.4)
CHLORIDE: 110 mmol/L — ABNORMAL HIGH (ref 98–107)
CO2: 21 mmol/L (ref 20.0–31.0)
CREATININE: 1.77 mg/dL — ABNORMAL HIGH
EGFR CKD-EPI (2021) MALE: 49 mL/min/{1.73_m2} — ABNORMAL LOW (ref >=60)
GLUCOSE RANDOM: 115 mg/dL (ref 70–179)
POTASSIUM: 4.1 mmol/L (ref 3.4–4.8)
PROTEIN TOTAL: 6.4 g/dL (ref 5.7–8.2)
SODIUM: 141 mmol/L (ref 135–145)

## 2021-07-08 MED ADMIN — LORazepam (ATIVAN) injection 1 mg: 1 mg | INTRAVENOUS | @ 16:00:00 | Stop: 2021-07-08

## 2021-07-08 MED ADMIN — methylPREDNISolone sodium succinate (PF) (Solu-MEDROL) injection 125 mg: 125 mg | INTRAVENOUS | @ 19:00:00

## 2021-07-08 MED ADMIN — methylPREDNISolone sodium succinate (PF) (Solu-MEDROL) injection 80 mg: 80 mg | INTRAVENOUS | @ 16:00:00 | Stop: 2021-07-08

## 2021-07-08 MED ADMIN — famotidine (PF) (PEPCID) injection 20 mg: 20 mg | INTRAVENOUS | @ 16:00:00 | Stop: 2021-07-08

## 2021-07-08 MED ADMIN — diphenhydrAMINE (BENADRYL) 50 mg/mL injection: INTRAVENOUS | @ 19:00:00 | Stop: 2021-07-08

## 2021-07-08 MED ADMIN — sodium chloride 0.9% (NS) bolus 1,000 mL: 1000 mL | INTRAVENOUS | @ 19:00:00

## 2021-07-08 MED ADMIN — famotidine (PF) (PEPCID) injection 20 mg: 20 mg | INTRAVENOUS | @ 19:00:00

## 2021-07-08 MED ADMIN — diphenhydrAMINE (BENADRYL) capsule/tablet 25 mg: 25 mg | ORAL | @ 16:00:00 | Stop: 2021-07-08

## 2021-07-08 MED ADMIN — famotidine (PF) (PEPCID) 20 mg/2 mL injection: INTRAVENOUS | @ 19:00:00 | Stop: 2021-07-08

## 2021-07-08 MED ADMIN — methylPREDNISolone sodium succinate (PF) (Solu-MEDROL) 125 mg/2 mL injection: INTRAVENOUS | @ 19:00:00 | Stop: 2021-07-08

## 2021-07-08 MED ADMIN — immune globulin, IgA less than 1 microgram/mL (GAMMAGARD S/D) 5% intravenous solution 10 g: .13 g/kg | INTRAVENOUS | @ 17:00:00 | Stop: 2021-07-08

## 2021-07-08 MED ADMIN — diphenhydrAMINE (BENADRYL) injection 25 mg: 25 mg | INTRAVENOUS | @ 19:00:00

## 2021-07-08 MED ADMIN — sodium chloride (NS) 0.9 % infusion: 20 mL/h | INTRAVENOUS | @ 16:00:00 | Stop: 2021-07-08

## 2021-07-08 MED ADMIN — acetaminophen (TYLENOL) tablet 650 mg: 650 mg | ORAL | @ 16:00:00 | Stop: 2021-07-08

## 2021-07-08 NOTE — Unmapped (Addendum)
Infusion Progress Note    Patient Name: Adam Keith  Patient Age: 41 y.o.  Encounter Date: 07/08/2021  Allergies   Allergen Reactions   ??? Bupropion Hcl Other (See Comments)     Per patient out of touch with reality, suicidal, homicidal   ??? Cefepime Rash     DRESS   ??? Ceftaroline Fosamil Rash and Other (See Comments)     Rash X 2 02/2020, suspected DRESS 06/2020   ??? 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 Onion Anaphylaxis   ??? Vancomycin Analogues      Hearing loss with Lasix  Other reaction(s): Other (See Comments)  Hearing loss  lasix   ??? Bismuth Subsalicylate Nausea And Vomiting   ??? Privigen [Immun Glob G(Igg)-Pro-Iga 0-50] Other (See Comments)     03/10/2021 VIG infusion stopped as patient developed rigors, tachycardia and HTN during infusion despite pre-medication; 03/12/21 completed IVIG with premedications and slower rate of infusion   ??? Furosemide      With Vancomycin caused hearing loss  Other reaction(s): Other (See Comments)  With Vancomycin caused hearing loss     Reason for visit  Infusion Center visit   Chief Complaint: hypersensitivity reaction (IVIG)    I spent at least 25 minutes with this patient: assessing, performing physical exam with > 50% of time spent in counseling.     Assessment/Plan:  Hypersensitivity reaction Grade 2    -premedications: Acetaminophen, diphenhydramine, famotidine, methylprednisolone  -symptoms: Chest discomfort, SOB, flushing  -amount infused: 40 ml  -reaction medications: Famotidine, diphenhydramine, methylprednisolone  -disposition: OK to restart infusion 30 min after symptoms resolve at half rate    I have discussed the case (exam, laboratory results, and plan) with Ellouise Newer, NP     Interval History/HPI:  Adam Keith is a 41 y.o. with ALL who is Infusion today for IVIG infusion. He hd received approximately 40 ml of IVIG (GAMMAGARD S/D) when he reported experiencing Chest discomfort/tightness, SOB, and flushing. Infusion was stopped and pt received iv famotidine, diphenhydramine, and methylprednisolone per emergency protocol. He also received supplemental oxygen at 5L/M, which was eventually weaned off to room air. VSS. He noticed symptom improvement shortly after receiving emergency meds. Per pt's team, he will not be re challenged today and tomorrow's infusion is also cancelled. Pt has also requested a no rechallenge today. Pt informed by RN of plan. Pt discharge home after complete symptom resolution.    Oncology History Overview Note   Referring/Local Oncologist: None    Diagnosis:Ph+ ALL    Genetics:    Karyotype/FISH:Abnormal Karyotype: 46,XY,t(9;22)(q34;q11.2)[1]/45,XY,der(7;9)(q10;q10)t(9;22)(q34;q11.2),der(22)t(9;22)[11]/46,sdl,+der(22)t(9;22)[5]/46,XY[3]     Abnormal FISH: A BCR/ABL1 interphase FISH assay shows an abnormal signal pattern in 97% of the 100 cells scored. 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  The findings support a diagnosis of leukemia and have implications for targeted therapy and for monitoring residual disease.      Molecular Genetics:  BCR-ABL1 p210 transcripts were detected at a level of 46.479 IS% ratio in bone marrow.  BCR-ABL1 p190 transcripts were detected at a level of 4 in 100,000 cells in bone marrow.    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)   01/23/2020 Initial Diagnosis    Acute  lymphoblastic leukemia (ALL) not having achieved remission (CMS-HCC)     01/24/2020 - 04/02/2020 Chemotherapy    IP/OP LEUKEMIA GRAAPH-2005 + RITUXIMAB < 60 YO  rituximab hypercvad (odd and even course)     02/24/2020 Remission    CR with PCR MRD+; marrow showing 70% blasts with <1% blasts, negative flow MRD, BCR-ABL p210 PCR 0.197%; CNS negative for blasts     03/09/2020 Progression    CSF - rare blast ID'ed - IT chemo given    03/13/20 - IT chemo, CSF negative     04/16/2020 Biopsy    BM Bx with 40-50% cellularity, <1% blasts, MRD flow cytometry negative, BCR-ABL p210 transcripts 0.036%     05/28/2020 - 05/28/2020 Chemotherapy    OP AML - CNS THERAPY (INTRATHECAL CYTARABINE, INTRATHECAL METHOTREXATE, OR INTRATHECAL TRIPLE)  Select one of the following: cytarabine IT 100 mg with hydrocortisone 50 mg, cytarabine IT 40 mg with methotrexate 15 mg with hydrocortisone 50 mg, OR methotrexate IT 12 mg with hydrocortisone 50 mg     06/19/2020 -  Chemotherapy    IP/OP LEUKEMIA BLINATUMOMAB 7-DAY INFUSION (MINIMAL RESIDUAL DISEASE; WT >= 22 KG) (HOME INFUSION)      Cycles 1*-4: Blinatumomab 28 mcg/day Days 1-28 of 6-week cycle.  *Given in the inpatient setting on Days 1-3 on Cycle 1 and Days 1-2 on Cycle 2, while other treatment days are given in the outpatient setting.    Cycle 1 MRD Dosing     07/18/2020 Adverse Reaction    Hospitalization: fevers. Pneumonia     07/23/2020 Biopsy    Diagnosis  Bone marrow, right iliac, aspiration and biopsy  -   Normocellular bone marrow (50%) with trilineage hematopoiesis and 1% blasts by manual aspirate differential  -   Flow cytometry MRD analysis reveals no definitive immunophenotypic evidence of residual B lymphoblastic leukemia   - BCR-ABL p210 0.006%         08/10/2020 -  Chemotherapy    Cycle 2 blinatumomab-ponatinib  Ponatinib 30mg     1 IT per cycle     09/21/2020 Adverse Reaction    Covid-19, symptomatic but not requiring hospitalization.     10/06/2020 -  Chemotherapy    Cycle 3 blinatumomab-ponatinib  Ponatinib 30mg        11/01/2020 Adverse Reaction    Hospitalization: MRSA Pneumonia requiring intubation    Blinatumomab stopped.  Ponatinib held until he stabilized.       12/14/2020 -  Chemotherapy    Cycle 4 blinatumomab 28 mcg/day days 1-28, ponatinib 30 mg per day IT triple therapy day 29     01/13/2021 -  Chemotherapy    Ponatinib 30mg  daily  - due to due low level BCR-ABL     03/06/2021 Adverse Reaction    Hospitalization - ICU on ventilator - influenza and MRSA/klebsiella PNA     ALL (acute lymphoblastic leukemia) (CMS-HCC)   06/11/2020 -  Chemotherapy    IP/OP LEUKEMIA BLINATUMOMAB 7-DAY INFUSION (MINIMAL RESIDUAL DISEASE; WT >= 22 KG) (HOME INFUSION)  Cycles 1*-4: Blinatumomab 28 mcg/day Days 1-28 of 6-week cycle.  *Given in the inpatient setting on Days 1-3 on Cycle 1 and Days 1-2 on Cycle 2, while other treatment days are given in the outpatient setting.     09/21/2020 Initial Diagnosis    ALL (acute lymphoblastic leukemia) (CMS-HCC)     Acute lymphoblastic leukemia in remission (CMS-HCC)   06/11/2020 -  Chemotherapy    IP/OP LEUKEMIA BLINATUMOMAB 7-DAY INFUSION (MINIMAL RESIDUAL DISEASE; WT >= 22 KG) (  HOME INFUSION)  Cycles 1*-4: Blinatumomab 28 mcg/day Days 1-28 of 6-week cycle.  *Given in the inpatient setting on Days 1-3 on Cycle 1 and Days 1-2 on Cycle 2, while other treatment days are given in the outpatient setting.     11/09/2020 Initial Diagnosis    Acute lymphoblastic leukemia in remission (CMS-HCC)       Review of Systems:  A complete review of systems was obtained including: Constitutional, Eyes, ENT, Cardiovascular, Respiratory, GI, GU, Musculoskeletal, Skin, Neurological, Psychiatric, Endocrine, Heme/Lymphatic, and Allergic/Immunologic systems. It is negative or non-contributory to the patient???s management except for positives mentioned in HPI.     Physical Exam:  Vital Signs:  Wt Readings from Last 3 Encounters:   07/08/21 (!) 113.4 kg (250 lb)   06/24/21 (!) 117.2 kg (258 lb 6.1 oz)   05/24/21 (!) 107.4 kg (236 lb 12.8 oz)     Temp Readings from Last 3 Encounters:   07/08/21 36.7 ??C (98.1 ??F) (Oral)   07/08/21 36 ??C (96.8 ??F) (Temporal)   06/24/21 36.3 ??C (97.3 ??F) (Temporal)     BP Readings from Last 3 Encounters:   07/08/21 125/66   07/08/21 145/80   06/24/21 130/90     Pulse Readings from Last 3 Encounters:   07/08/21 80 07/08/21 104   06/24/21 80     General:  In mild acute distress.  HEENT: Normocephalic and atraumatic. No scleral icterus or conjunctival injection.    Neck:  Supple. Trachea midline  Cardiovascular:  Regular rate, & rhythm. S1/S2 No significant murmurs or gallops  Respiratory: Breathing is unlabored and patient is speaking full sentences with ease. No stridor. Auscultation of lung fields reveals normal air movement without wheezing, ronchi or crackles.   Extremities: Lower extremities are warm and without edema. Distal pulses are full and symmetric. No clubbing, or cyanosis.  Skin and Subcutaneous Tissues: Erythematous rash noted on both hands. Rash is not new. Appropriately warm and dry  Psychiatric:  Alert and oriented to person, place, time and situation. Range of affect is appropriate.     Results:  Recent Labs     07/08/21  0821   WBC 11.1   HGB 15.2   HCT 43.9   PLT 240   NEUTROABS 6.9   LYMPHSABS 2.5   MONOSABS 0.9*   EOSABS 0.8*   BASOSABS 0.1     Recent Labs     07/08/21  0821   NA 141   K 4.1   CL 110*   CO2 21.0   BUN 18   CREATININE 1.77*   GLU 115   CALCIUM 8.9   ALBUMIN 4.1   PROT 6.4   BILITOT 0.2*   AST 28   ALT 42   ALKPHOS 81     Leeroy Cha, FNP

## 2021-07-08 NOTE — Unmapped (Signed)
PIV placed by RN Meghan Cheyunski, labs drawn and sent.

## 2021-07-08 NOTE — Unmapped (Signed)
Pt arrived ambulatory and stable to clinic.  Therapy plan, weight, VSS and lab results reviewed.  Pt to receive IVIG today, has reaction history to drug.  IVIG given after premeds (benadryl, Solu-Medrol, pepcid, and tylenol) at a flat rate of 0.66mL/kg/hr (97mL/hr).  Vitals charted during transfusion.  At 1432, infusion was stopped per Pt request. Pt communicated: I feel like its coming on. Regarding a reaction.  Pt concentrated on controlling his breathing, verbalized he was feeling the same sensations before going into anaphylaxis, as he lose[s] control of breathing.  Pt reports rigors in the past, I feel it on the inside, but its not coming to the outside yet.  Lucendia Herrlich, NP at chairside. 25mg  of Benadryl, 125mg  of Solu-Medrol, and 20mg  of Pepcid given in his IV. 1L NS bolus hung, supplemental O2 administered thru nasal canula, 5L O2.  See VSS recorded in Epic Flowsheets Vital Signs. 15 minutes after stopping IVIG and administering medications, Pt reports sensations to be improving.  After 45 minutes, Pt reports feeling back to normal.  Pt opted to not continue with IVIG today, and requested to go home.  Pt's VSS within normal limits and stable. OK to discharge per Lucendia Herrlich, NP and charge nurse Fredia Beets, RN. Pt discharged in ambulatory and stable condition.  Pt's medical team to re-evaluate IVIG regimen for Pt.  No further interventions at this time.

## 2021-07-09 NOTE — Unmapped (Signed)
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 We discussed plan to split dose over 3 infusion days, support with pre-medications, as well as flat rate with no titration. Reviewed medication list. Reports he is taking ponatinib with no missed doses. No symptoms or side effects noted with ponatinib. Also taking baby aspirin.     Labs: notable for creatinine 1.77 (stable, history of CKD)  Last total IgG=292 (06/25/30)    Oncologic History:  Oncology History Overview Note   Referring/Local Oncologist: None    Diagnosis:Ph+ ALL    Genetics:    Karyotype/FISH:Abnormal Karyotype: 46,XY,t(9;22)(q34;q11.2)[1]/45,XY,der(7;9)(q10;q10)t(9;22)(q34;q11.2),der(22)t(9;22)[11]/46,sdl,+der(22)t(9;22)[5]/46,XY[3]     Abnormal FISH: A BCR/ABL1 interphase FISH assay shows an abnormal signal pattern in 97% of the 100 cells scored. 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  The findings support a diagnosis of leukemia and have implications for targeted therapy and for monitoring residual disease.      Molecular Genetics:  BCR-ABL1 p210 transcripts were detected at a level of 46.479 IS% ratio in bone marrow.  BCR-ABL1 p190 transcripts were detected at a level of 4 in 100,000 cells in bone marrow.    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) 01/24/2020 - 04/02/2020 Chemotherapy    IP/OP LEUKEMIA GRAAPH-2005 + RITUXIMAB < 60 YO  rituximab hypercvad (odd and even course)     02/24/2020 Remission    CR with PCR MRD+; marrow showing 70% blasts with <1% blasts, negative flow MRD, BCR-ABL p210 PCR 0.197%; CNS negative for blasts     03/09/2020 Progression    CSF - rare blast ID'ed - IT chemo given    03/13/20 - IT chemo, CSF negative     04/16/2020 Biopsy    BM Bx with 40-50% cellularity, <1% blasts, MRD flow cytometry negative, BCR-ABL p210 transcripts 0.036%     05/28/2020 - 05/28/2020 Chemotherapy    OP AML - CNS THERAPY (INTRATHECAL CYTARABINE, INTRATHECAL METHOTREXATE, OR INTRATHECAL TRIPLE)  Select one of the following: cytarabine IT 100 mg with hydrocortisone 50 mg, cytarabine IT 40 mg with methotrexate 15 mg with hydrocortisone 50 mg, OR methotrexate IT 12 mg with hydrocortisone 50 mg     06/19/2020 -  Chemotherapy    IP/OP LEUKEMIA BLINATUMOMAB 7-DAY INFUSION (MINIMAL RESIDUAL DISEASE; WT >= 22 KG) (HOME INFUSION)      Cycles 1*-4: Blinatumomab 28 mcg/day Days 1-28 of 6-week cycle.  *Given in the inpatient setting on Days 1-3 on Cycle 1 and Days 1-2 on Cycle 2, while other treatment days are given in the outpatient setting.    Cycle 1 MRD Dosing     07/18/2020 Adverse Reaction    Hospitalization: fevers. Pneumonia     07/23/2020 Biopsy    Diagnosis  Bone marrow, right iliac, aspiration and biopsy  -   Normocellular bone marrow (50%)  with trilineage hematopoiesis and 1% blasts by manual aspirate differential  -   Flow cytometry MRD analysis reveals no definitive immunophenotypic evidence of residual B lymphoblastic leukemia   - BCR-ABL p210 0.006%         08/10/2020 -  Chemotherapy    Cycle 2 blinatumomab-ponatinib  Ponatinib 30mg     1 IT per cycle     09/21/2020 Adverse Reaction    Covid-19, symptomatic but not requiring hospitalization.     10/06/2020 -  Chemotherapy    Cycle 3 blinatumomab-ponatinib  Ponatinib 30mg        11/01/2020 Adverse Reaction Hospitalization: MRSA Pneumonia requiring intubation    Blinatumomab stopped.  Ponatinib held until he stabilized.       12/14/2020 -  Chemotherapy    Cycle 4 blinatumomab 28 mcg/day days 1-28, ponatinib 30 mg per day IT triple therapy day 29     01/13/2021 -  Chemotherapy    Ponatinib 30mg  daily  - due to due low level BCR-ABL     03/06/2021 Adverse Reaction    Hospitalization - ICU on ventilator - influenza and MRSA/klebsiella PNA     ALL (acute lymphoblastic leukemia) (CMS-HCC)   06/11/2020 -  Chemotherapy    IP/OP LEUKEMIA BLINATUMOMAB 7-DAY INFUSION (MINIMAL RESIDUAL DISEASE; WT >= 22 KG) (HOME INFUSION)  Cycles 1*-4: Blinatumomab 28 mcg/day Days 1-28 of 6-week cycle.  *Given in the inpatient setting on Days 1-3 on Cycle 1 and Days 1-2 on Cycle 2, while other treatment days are given in the outpatient setting.     09/21/2020 Initial Diagnosis    ALL (acute lymphoblastic leukemia) (CMS-HCC)     Acute lymphoblastic leukemia in remission (CMS-HCC)   06/11/2020 -  Chemotherapy    IP/OP LEUKEMIA BLINATUMOMAB 7-DAY INFUSION (MINIMAL RESIDUAL DISEASE; WT >= 22 KG) (HOME INFUSION)  Cycles 1*-4: Blinatumomab 28 mcg/day Days 1-28 of 6-week cycle.  *Given in the inpatient setting on Days 1-3 on Cycle 1 and Days 1-2 on Cycle 2, while other treatment days are given in the outpatient setting.     11/09/2020 Initial Diagnosis    Acute lymphoblastic leukemia in remission (CMS-HCC)         Weight and Vitals:  Wt Readings from Last 3 Encounters:   07/08/21 (!) 113.4 kg (250 lb)   06/24/21 (!) 117.2 kg (258 lb 6.1 oz)   05/24/21 (!) 107.4 kg (236 lb 12.8 oz)     Temp Readings from Last 3 Encounters:   07/08/21 36.6 ??C (97.9 ??F) (Oral)   07/08/21 36 ??C (96.8 ??F) (Temporal)   06/24/21 36.3 ??C (97.3 ??F) (Temporal)     BP Readings from Last 3 Encounters:   07/08/21 125/68   07/08/21 145/80   06/24/21 130/90     Pulse Readings from Last 3 Encounters:   07/08/21 74   07/08/21 104   06/24/21 80       Pertinent Labs:  Lab on 07/08/2021 Component Date Value Ref Range Status   ??? Sodium 07/08/2021 141  135 - 145 mmol/L Final   ??? Potassium 07/08/2021 4.1  3.4 - 4.8 mmol/L Final   ??? Chloride 07/08/2021 110 (H)  98 - 107 mmol/L Final   ??? CO2 07/08/2021 21.0  20.0 - 31.0 mmol/L Final   ??? Anion Gap 07/08/2021 10  5 - 14 mmol/L Final   ??? BUN 07/08/2021 18  9 - 23 mg/dL Final   ??? Creatinine 07/08/2021 1.77 (H)  0.60 - 1.10 mg/dL Final   ??? BUN/Creatinine Ratio  07/08/2021 10   Final   ??? eGFR CKD-EPI (2021) Male 07/08/2021 49 (L)  >=60 mL/min/1.97m2 Final    eGFR calculated with CKD-EPI 2021 equation in accordance with [employer] and [employer] of Nephrology Task Force recommendations.   ??? Glucose 07/08/2021 115  70 - 179 mg/dL Final   ??? Calcium 45/40/9811 8.9  8.7 - 10.4 mg/dL Final   ??? Albumin 91/47/8295 4.1  3.4 - 5.0 g/dL Final   ??? Total Protein 07/08/2021 6.4  5.7 - 8.2 g/dL Final   ??? Total Bilirubin 07/08/2021 0.2 (L)  0.3 - 1.2 mg/dL Final   ??? AST 62/13/0865 28  <=34 U/L Final   ??? ALT 07/08/2021 42  10 - 49 U/L Final   ??? Alkaline Phosphatase 07/08/2021 81  46 - 116 U/L Final   ??? WBC 07/08/2021 11.1  3.6 - 11.2 10*9/L Final   ??? RBC 07/08/2021 5.11  4.26 - 5.60 10*12/L Final   ??? HGB 07/08/2021 15.2  12.9 - 16.5 g/dL Final   ??? HCT 78/46/9629 43.9  39.0 - 48.0 % Final   ??? MCV 07/08/2021 86.0  77.6 - 95.7 fL Final   ??? MCH 07/08/2021 29.7  25.9 - 32.4 pg Final   ??? MCHC 07/08/2021 34.6  32.0 - 36.0 g/dL Final   ??? RDW 52/84/1324 15.5 (H)  12.2 - 15.2 % Final   ??? MPV 07/08/2021 8.4  6.8 - 10.7 fL Final   ??? Platelet 07/08/2021 240  150 - 450 10*9/L Final   ??? Neutrophils % 07/08/2021 62.2  % Final   ??? Lymphocytes % 07/08/2021 22.6  % Final   ??? Monocytes % 07/08/2021 7.8  % Final   ??? Eosinophils % 07/08/2021 6.8  % Final   ??? Basophils % 07/08/2021 0.6  % Final   ??? Absolute Neutrophils 07/08/2021 6.9  1.8 - 7.8 10*9/L Final   ??? Absolute Lymphocytes 07/08/2021 2.5  1.1 - 3.6 10*9/L Final   ??? Absolute Monocytes 07/08/2021 0.9 (H)  0.3 - 0.8 10*9/L Final   ??? Absolute Eosinophils 07/08/2021 0.8 (H)  0.0 - 0.5 10*9/L Final   ??? Absolute Basophils 07/08/2021 0.1  0.0 - 0.1 10*9/L Final       Allergies:   Allergies   Allergen Reactions   ??? Bupropion Hcl Other (See Comments)     Per patient out of touch with reality, suicidal, homicidal   ??? Cefepime Rash     DRESS   ??? Ceftaroline Fosamil Rash and Other (See Comments)     Rash X 2 02/2020, suspected DRESS 06/2020   ??? 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 Onion Anaphylaxis   ??? Vancomycin Analogues      Hearing loss with Lasix  Other reaction(s): Other (See Comments)  Hearing loss  lasix   ??? Bismuth Subsalicylate Nausea And Vomiting   ??? Privigen [Immun Glob G(Igg)-Pro-Iga 0-50] Other (See Comments)     03/10/2021 VIG infusion stopped as patient developed rigors, tachycardia and HTN during infusion despite pre-medication; 03/12/21 completed IVIG with premedications and slower rate of infusion   ??? Furosemide      With Vancomycin caused hearing loss  Other reaction(s): Other (See Comments)  With Vancomycin caused hearing loss       Drug Interactions: none identified      Current Medications:  Current Outpatient Medications   Medication Sig Dispense Refill   ??? acetaminophen (TYLENOL) 325 MG tablet Take  2 tablets (650 mg total) by mouth every six (6) hours as needed for pain.     ??? albuterol HFA 90 mcg/actuation inhaler Inhale 2 puffs every six (6) hours as needed for wheezing. 8 g 11   ??? amLODIPine (NORVASC) 10 MG tablet Take 1 tablet (10 mg total) by mouth daily. 30 tablet 0   ??? arm brace (WRIST BRACE) Misc 1 Piece by Miscellaneous route as needed. left ulnar wrist brace 1 each 0   ??? aspirin 81 MG chewable tablet Chew 1 tablet (81 mg total) daily. 36 tablet 11   ??? carvediloL (COREG) 12.5 MG tablet Take 1 tablet (12.5 mg total) by mouth Two (2) times a day. 180 tablet 1   ??? chlorhexidine (HIBICLENS) 4 % external liquid Apply topically daily. as directed 946 mL 0   ??? clonazePAM (KLONOPIN) 0.5 MG tablet Take 1 tablet (0.5 mg total) by mouth two (2) times a day as needed for anxiety or sleep. 60 tablet 1   ??? doxycycline (VIBRA-TABS) 100 MG tablet Take 1 tablet (100 mg total) by mouth Two (2) times a day. 180 tablet 3   ??? gabapentin (NEURONTIN) 300 MG capsule Take 2 capsules (600mg ) by mouth in the morning and 3 capsules (900mg ) at bedtime. May cause drowsiness. 150 capsule 5   ??? HYDROmorphone (DILAUDID) 2 MG tablet Take 1 tablet (2 mg total) by mouth two (2) times a day as needed. 56 tablet 0   ??? mirtazapine (REMERON) 15 MG tablet TAKE 1 TABLET BY MOUTH EVERY DAY AT NIGHT 90 tablet 4   ??? mucus clearing device Devi 1 each by Miscellaneous route Two (2) times a day. 1 each 0   ??? mupirocin (BACTROBAN) 2 % ointment Apply topically daily. apply in each nostril prior to bed, as directed 15 g 0   ??? nortriptyline (PAMELOR) 50 MG capsule Take 2 capsules (100 mg total) by mouth nightly.     ??? OXYCONTIN 15 mg 12 hr crush resistant ER/CR tablet Take 1 tablet (15 mg total) by mouth Two (2) times a day. 56 tablet 0   ??? PONATinib (ICLUSIG) 30 mg tablet Take 1 tablet (30 mg total) by mouth daily. Swallow tablets whole. Do not crush, break, cut or chew tablets. 30 tablet 5   ??? sulfamethoxazole-trimethoprim (BACTRIM DS) 800-160 mg per tablet Take 1 tablet (160 mg of trimethoprim total) by mouth 2 times a day on Saturday, Sunday. For prophylaxis while on chemo. 48 tablet 3   ??? umeclidinium-vilanteroL (ANORO ELLIPTA) 62.5-25 mcg/actuation inhaler Inhale 1 puff daily. 60 each 11   ??? valACYclovir (VALTREX) 500 MG tablet Take 1 tablet (500 mg total) by mouth daily. 90 tablet 11     No current facility-administered medications for this visit.       Adherence: no barriers identified        Assessment: AdamKeith is a 41 y.o. male with Ph+ B-ALL being treated currently with ponatinib 30 mg daily. Most recent BCR/ABL from 3/23 negative from peripheral blood. Could consider dose reduction of ponatinib - will discuss with Dr. Senaida Ores. Regarding IVIG for hyogammaglobulinemia - he has an extensive history of infusion-related reactions to IVIG. Will plan to rechallenge with IVIG today as outlined below.    Plan:   1) Ph+ B-ALL  -Continue ponatinib 30 mg PO daily  -Continue aspirin 81 mg daily while on ponatinib    2) Hypogammaglobulinemia  -Proceed with low IgA (Gammagard) 0.4 g/kg (~30 g total) split over 3 doses. 3 infusions  total and NO rate titration. Proceed with flat rate of 0.3 mL/kg/hr. Planned infusion today, 4/17, and 4/24. Therapy plan updated with additional pre-medications and admin instructions.   -If patient reacts today, we will NOT proceed with further IVIG.     F/u:  Future Appointments   Date Time Provider Department Center   07/14/2021 10:00 AM Artelia Laroche, MD [facility]   07/14/2021 11:00 AM ONCBMT COORDINATOR [facility]   07/14/2021 11:45 AM ONCBMT LABS [facility]   07/26/2021 11:30 AM Burman Riis Diddams, MD [facility]   07/28/2021  1:30 PM Everlena Cooper, MD [facility]   08/13/2021  1:30 PM Melissa Ronalee Belts, PMHNP [id number] [facility]   08/31/2021  7:30 AM ADULT ONC LAB [facility]   08/31/2021  8:30 AM Sean Marrian Salvage, AGNP [id number]   08/31/2021  9:30 AM ONCINF CHAIR 13 [id number]   10/22/2021 10:00 AM Park Breed, MD [id number]       I spent 20 minutes with Adam Keith in direct patient care.    Ronnald Collum, PharmD, BCOP, CPP  Hematology/Oncology Clinical Pharmacist  Pager [phone]

## 2021-07-12 DIAGNOSIS — C9101 Acute lymphoblastic leukemia, in remission: Principal | ICD-10-CM

## 2021-07-12 DIAGNOSIS — Z7682 Awaiting organ transplant status: Principal | ICD-10-CM

## 2021-07-13 NOTE — Unmapped (Addendum)
[facility] BMT/CT NEW PATIENT CONSULT NOTE    Reason for Consultation:  Kimon Loewen is seen today accompanied by his wife for consultation in regard to recommended treatment with an allogeneic hematopoietic cell transplantation. Patient was referred by [name].    Referring Physician:  [name], MD  [address]  [place],  [place] [address]        History of Present Illness (Details obtained from both available medical records and the patient)  Tasha Jindra is a 41 yo male with Ph(+) B-ALL originally diagnosed in 01/2020. He was previously healthy but the course of treatment was complicated by multiple hospitalizations for life threatening infections and resultant CKD, hypertension, anxiety, and hypogammaglobulinemia.   Treatment course is outlined below as follows:     01/20/20-04/01/20 Hospitalization:  - 01/20/20: Presented with painful 22.3 cm splenomegaly, non-bulky adenopathy, constitutional symptoms. CBC with WBC 37.2, hgb 13, MCV 84.3, Plt 20.  - 01/21/20: Flow cytometry of peripheral blood with B-cell ALL (90% blasts). BMBx with hypercellular bone marrow (>95%) involved by B lymphoblastic leukemia with >95% blasts. Cytogenetics with three abnormal clones containing t(9;22).   BCR-ABL p190 detected at 4/100,000. p210 transcripts detected at 46.479 IS%.    - 01/24/20: Began induction per [id number] with dasatinib substituted for imatinib.   - 02/24/20: Day 29 (remission) BMBx with hypercellular (70%) bone marrow with erythroid predominant TLH and <1% blasts. MRD(-) by flow. BCR-ABL p210 detected at 0.197%. Continued dasatinib.   - 04/08/20: Discharged home from AIR   Complications:   - Septic shock 2/2 MRSA bacteremia and likely endocarditis. Treated with daptomycin which was complicated by myalgia and CK elevation. Switched to ceftaroline complicated by pancytopenia and rash. Then treated with vancomycin which was complicated by hearing loss.   - Respiratory failure requiring intubation (01/28/20). Bronchoscopy confirmed MRSA pneumonia.   - Acute kidney injury requiring CRRT (10/27-10/28) with renal recovery.   - Candida krusei fungemia (11/4 BCx positive) and eye exam concerning for endophthalmitis. Treated with micafungin + voriconazole. Continued on micafungin prophylaxis.   - Bilateral hearing loss attributed to bilateral mastoid effusion and ototoxic use of vancomycin + lasix. Sp myringotomy with improvement of hearing and disequilibrium sensation to L ear.     - 04/16/20 BMBx: Normocellular (40-50%) with TLH and <1% blasts. MRD(-) by flow. BCR-ABL p210 0.036 IS%. The patient has not been able to received cytotoxic chemotherapy but continued dasatinib.     - 05/13/20: Presented with dyspnea and found small R pleural effusion and increased interstitial edema on CXR, attributed to dasatinib. Decision made to hold dasatinib. Due to complications with induction chemotherapy, plan was to switch to blinatumomab+ponatinib instead of hyper-CVAD + TKI.   Treatment delayed due to COVID-19 infection.   - 2/27-2/28 Hospitalization for COVID-19 infection with dyspnea, chills and myalgias. Given monoclonal antibodies.    - 06/12/20: Developed erythematous skin rash. Skin biopsy showed mild spongiosis with superficial dermal perivascular lymphoeosinophilic infiltrate, consistent with eczematous drug reaction and likely DRESS. Treated with prednisone taper. Attributed to dasatinib.     - 3/18-3/26/22 Hospitalization: 06/22/20: C1D1 blinatumomab + ponatinib.   - 07/17/20-07/21/20 Hospitalization: For fever, productive cough concerning for pneumonia. Bronchial cultures resulted positive for MRSA and he was treated with linezolid for 14 days. Left AMA.   - 07/23/20 BMBx: Normocellular (50%) with TLH and 1% blasts. MRD(-) by flow. BCR-ABL p210 0.006 IS%.     - 08/10/20-08/12/20 hospitalization: C2D1 blinatumomab + ponatinib.     - 10/06/20: C3D1 blinatumomab +  ponatinib.   - 11/01/20-11/09/20 Hospitalization: Presented with hypoxemic respiratory failure 2/2 RLL pneumonia. Intubated on 8/1 after failing BiPAP. Treated with daptomycin through 8/17.  - 11/17/20-11/22/20 Hospitalization: Re-admitted for pleuritic chest pain and fever with CT chest concerning for empyema. Diagnostic thoracentesis was unsuccessful. Found to have new pulmonary nodules. Discharged on linezolid with plan for outpatient bronchoscopy.     - 12/14/20: C4C1 blinatumomab and then continued on single agent ponatinib maintenance   - 01/11/21: BMBx: Normocellular with TLH and <1% blasts. MRD(-) by flow. BCR-ABL p210 0.002 IS%.   - 03/06/21-03/20/21: Hospitalized for hypoxic respiratory failure and productive cough requiring intubation. Bronchoscopy with BAL on 12/12 revealed MRSA and Klebsiella. Treated with inhaled amikacin, oral levofloxacin and oral linezolid for planned 4-6 weeks.     - 06/24/21 PB sample was BCR-ABL p210 negative.       Today, Mr. Vigen reports feeling stable and content with complete molecular response for the first time since his cancer diagnosis. He continues on ponatinib monotherapy with doxycycline for MRSA decolonization. He has good days and bad days and is still worn out from all of his hospitalizations last year. He requires help from his wife to move objects heavier than 30 pounds. He has four steps leading up to his house that he can climb without difficulty, but feels exhausted with prolonged walking inside a mall. 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 He is hoping to expand this. 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 He believes there is no way he would ever get back the strength to return to that line of work. He feels impatient with the slow progress he has had. He avoids stores during peak hours because of infection risk. He is worried about the complications he has had from chemotherapy and whether transplant would send him back to being critically ill and ventilator dependent. He is worried he wouldn't survive transplant and about the risk of relapse even if he did. One of his friends was diagnosed with AML around the same time as him and underwent allo-sCT one year ago. His friend is currently G-tube dependent and unable to ambulate. This is a source of considerable stress for him.    He does not have brothers or sisters.   Does have 2 daughters (53 year old fraternal twins). One is hypochondriac and would not be able to donate. The other, Lurena Joiner, lives nearby in [place].   He would prefer she not be typed until he has decided to move forward with transplant as this would add to her anxiety.      Oncology History Overview Note   Referring/Local Oncologist: None    Diagnosis:Ph+ ALL    Genetics:    Karyotype/FISH:Abnormal Karyotype: 46,XY,t(9;22)(q34;q11.2)[1]/45,XY,der(7;9)(q10;q10)t(9;22)(q34;q11.2),der(22)t(9;22)[11]/46,sdl,+der(22)t(9;22)[5]/46,XY[3]     Abnormal FISH: A BCR/ABL1 interphase FISH assay shows an abnormal signal pattern in 97% of the 100 cells scored. 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  The findings support a diagnosis of leukemia and have implications for targeted therapy and for monitoring residual disease.      Molecular Genetics:  BCR-ABL1 p210 transcripts were detected at a level of 46.479 IS% ratio in bone marrow.  BCR-ABL1 p190 transcripts were detected at a level of 4 in 100,000 cells in bone marrow.    Pertinent Phenotypic data:  Disease-specific prognostic estimate: High Risk, Ph+       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)   01/23/2020 Initial Diagnosis    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)     01/24/2020 - 04/02/2020 Chemotherapy    IP/OP LEUKEMIA GRAAPH-2005 + RITUXIMAB < 60 YO  rituximab hypercvad (odd and even course)     02/24/2020 Remission    CR with PCR MRD+; marrow showing 70% blasts with <1% blasts, negative flow MRD, BCR-ABL p210 PCR 0.197%; CNS negative for blasts     03/09/2020 Progression    CSF - rare blast ID'ed - IT chemo given    03/13/20 - IT chemo, CSF negative     04/16/2020 Biopsy    BM Bx with 40-50% cellularity, <1% blasts, MRD flow cytometry negative, BCR-ABL p210 transcripts 0.036%     05/28/2020 - 05/28/2020 Chemotherapy    OP AML - CNS THERAPY (INTRATHECAL CYTARABINE, INTRATHECAL METHOTREXATE, OR INTRATHECAL TRIPLE)  Select one of the following: cytarabine IT 100 mg with hydrocortisone 50 mg, cytarabine IT 40 mg with methotrexate 15 mg with hydrocortisone 50 mg, OR methotrexate IT 12 mg with hydrocortisone 50 mg     06/19/2020 -  Chemotherapy    IP/OP LEUKEMIA BLINATUMOMAB 7-DAY INFUSION (MINIMAL RESIDUAL DISEASE; WT >= 22 KG) (HOME INFUSION)      Cycles 1*-4: Blinatumomab 28 mcg/day Days 1-28 of 6-week cycle.  *Given in the inpatient setting on Days 1-3 on Cycle 1 and Days 1-2 on Cycle 2, while other treatment days are given in the outpatient setting.    Cycle 1 MRD Dosing     07/18/2020 Adverse Reaction    Hospitalization: fevers. Pneumonia     07/23/2020 Biopsy    Diagnosis  Bone marrow, right iliac, aspiration and biopsy  -   Normocellular bone marrow (50%) with trilineage hematopoiesis and 1% blasts by manual aspirate differential  -   Flow cytometry MRD analysis reveals no definitive immunophenotypic evidence of residual B lymphoblastic leukemia   - BCR-ABL p210 0.006%         08/10/2020 -  Chemotherapy    Cycle 2 blinatumomab-ponatinib  Ponatinib 30mg     1 IT per cycle     09/21/2020 Adverse Reaction    Covid-19, symptomatic but not requiring hospitalization.     10/06/2020 -  Chemotherapy    Cycle 3 blinatumomab-ponatinib  Ponatinib 30mg        11/01/2020 Adverse Reaction    Hospitalization: MRSA Pneumonia requiring intubation    Blinatumomab stopped.  Ponatinib held until he stabilized.       12/14/2020 -  Chemotherapy    Cycle 4 blinatumomab 28 mcg/day days 1-28, ponatinib 30 mg per day IT triple therapy day 29     01/13/2021 -  Chemotherapy    Ponatinib 30mg  daily  - due to due low level BCR-ABL     03/06/2021 Adverse Reaction    Hospitalization - ICU on ventilator - influenza and MRSA/klebsiella PNA     ALL (acute lymphoblastic leukemia) (CMS-HCC)   06/11/2020 -  Chemotherapy    IP/OP LEUKEMIA BLINATUMOMAB 7-DAY INFUSION (MINIMAL RESIDUAL DISEASE; WT >= 22 KG) (HOME INFUSION)  Cycles 1*-4: Blinatumomab 28 mcg/day Days 1-28 of 6-week cycle.  *Given in the inpatient setting on Days 1-3 on Cycle 1 and Days 1-2 on Cycle 2, while other treatment days are given in the outpatient setting.     09/21/2020 Initial Diagnosis    ALL (acute lymphoblastic leukemia) (CMS-HCC)  Acute lymphoblastic leukemia in remission (CMS-HCC)   06/11/2020 -  Chemotherapy    IP/OP LEUKEMIA BLINATUMOMAB 7-DAY INFUSION (MINIMAL RESIDUAL DISEASE; WT >= 22 KG) (HOME INFUSION)  Cycles 1*-4: Blinatumomab 28 mcg/day Days 1-28 of 6-week cycle.  *Given in the inpatient setting on Days 1-3 on Cycle 1 and Days 1-2 on Cycle 2, while other treatment days are given in the outpatient setting.     11/09/2020 Initial Diagnosis    Acute lymphoblastic leukemia in remission (CMS-HCC)       Allergies   Allergen Reactions   ??? Bupropion Hcl Other (See Comments)     Per patient out of touch with reality, suicidal, homicidal   ??? Cefepime Rash     DRESS   ??? Ceftaroline Fosamil Rash and Other (See Comments)     Rash X 2 02/2020, suspected DRESS 06/2020   ??? 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 Onion Anaphylaxis   ??? Vancomycin Analogues      Hearing loss with Lasix  Other reaction(s): Other (See Comments)  Hearing loss  lasix   ??? Bismuth Subsalicylate Nausea And Vomiting   ??? Privigen [Immun Glob G(Igg)-Pro-Iga 0-50] Other (See Comments)     03/10/2021 VIG infusion stopped as patient developed rigors, tachycardia and HTN during infusion despite pre-medication; 03/12/21 completed IVIG with premedications and slower rate of infusion   ??? Furosemide      With Vancomycin caused hearing loss  Other reaction(s): Other (See Comments)  With Vancomycin caused hearing loss       Current Outpatient Medications:   ???  acetaminophen (TYLENOL) 325 MG tablet, Take 2 tablets (650 mg total) by mouth every six (6) hours as needed for pain., Disp: , Rfl:   ???  albuterol HFA 90 mcg/actuation inhaler, Inhale 2 puffs every six (6) hours as needed for wheezing., Disp: 8 g, Rfl: 11  ???  amLODIPine (NORVASC) 10 MG tablet, Take 1 tablet (10 mg total) by mouth daily., Disp: 30 tablet, Rfl: 0  ???  arm brace (WRIST BRACE) Misc, 1 Piece by Miscellaneous route as needed. left ulnar wrist brace, Disp: 1 each, Rfl: 0  ???  aspirin 81 MG chewable tablet, Chew 1 tablet (81 mg total) daily., Disp: 36 tablet, Rfl: 11  ???  carvediloL (COREG) 12.5 MG tablet, Take 1 tablet (12.5 mg total) by mouth Two (2) times a day., Disp: 180 tablet, Rfl: 1  ???  chlorhexidine (HIBICLENS) 4 % external liquid, Apply topically daily. as directed, Disp: 946 mL, Rfl: 0  ???  clonazePAM (KLONOPIN) 0.5 MG tablet, Take 1 tablet (0.5 mg total) by mouth two (2) times a day as needed for anxiety or sleep., Disp: 60 tablet, Rfl: 1  ???  doxycycline (VIBRA-TABS) 100 MG tablet, Take 1 tablet (100 mg total) by mouth Two (2) times a day., Disp: 180 tablet, Rfl: 3  ???  gabapentin (NEURONTIN) 300 MG capsule, Take 2 capsules (600mg ) by mouth in the morning and 3 capsules (900mg ) at bedtime. May cause drowsiness., Disp: 150 capsule, Rfl: 5  ???  HYDROmorphone (DILAUDID) 2 MG tablet, Take 1 tablet (2 mg total) by mouth two (2) times a day as needed., Disp: 56 tablet, Rfl: 0  ???  mirtazapine (REMERON) 15 MG tablet, TAKE 1 TABLET BY MOUTH EVERY DAY AT NIGHT, Disp: 90 tablet, Rfl: 4  ???  mucus clearing device Devi, 1 each by Miscellaneous route Two (2) times a day., Disp: 1 each, Rfl:  0  ??? mupirocin (BACTROBAN) 2 % ointment, Apply topically daily. apply in each nostril prior to bed, as directed, Disp: 15 g, Rfl: 0  ???  nortriptyline (PAMELOR) 50 MG capsule, Take 2 capsules (100 mg total) by mouth nightly., Disp: , Rfl:   ???  OXYCONTIN 15 mg 12 hr crush resistant ER/CR tablet, Take 1 tablet (15 mg total) by mouth Two (2) times a day., Disp: 56 tablet, Rfl: 0  ???  PONATinib (ICLUSIG) 30 mg tablet, Take 1 tablet (30 mg total) by mouth daily. Swallow tablets whole. Do not crush, break, cut or chew tablets., Disp: 30 tablet, Rfl: 5  ???  sulfamethoxazole-trimethoprim (BACTRIM DS) 800-160 mg per tablet, Take 1 tablet (160 mg of trimethoprim total) by mouth 2 times a day on Saturday, Sunday. For prophylaxis while on chemo., Disp: 48 tablet, Rfl: 3  ???  umeclidinium-vilanteroL (ANORO ELLIPTA) 62.5-25 mcg/actuation inhaler, Inhale 1 puff daily., Disp: 60 each, Rfl: 11  ???  valACYclovir (VALTREX) 500 MG tablet, Take 1 tablet (500 mg total) by mouth daily., Disp: 90 tablet, Rfl: 11    Past Medical History:   Diagnosis Date   ??? 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 On oxycontin, clonazepam, dilaudid PRN, mirtazapine, nortriptyline. Coping better now. Follows with psychiatry.   - Hypogammaglobulinemia: Hx infusion reactions to IVIG. Plan for slow infusion outpatient. IgG 292 (L) on 06/24/21.   - CKD: Since renal failure during induction requiring CRRT with renal recovery. Baseline creatinine ~1.7.   - Hypertension: Exacerbated by TKI. On amlodipine, carvedilol.     Past Surgical History:   Procedure Laterality Date   ??? BONE MARROW BIOPSY & ASPIRATION  01/21/2020        ??? CHG [place], CHEST,REAL TIME  11/20/2020    Procedure: ULTRASOUND, CHEST, REAL TIME WITH IMAGE DOCUMENTATION;  Surgeon: Jerelyn Charles, MD;  Location: [facility]ON[facility] PROCED[facility];  Service: Pulmonary   ??? IR INSERT PORT AGE GREATER THAN 5 YRS  03/10/2020    IR INSERT PORT AGE GREATER THAN 5 YRS 03/10/2020 [name], MD IMG VIR [facility]   ??? PR BRONCHOSCOPY,DIAGNOSTIC W LAVAGE Bilateral 07/20/2020    Procedure: BRONCHOSCOPY, RIGID OR FLEXIBLE, INCLUDE FLUOROSCOPIC GUIDANCE WHEN PERFORMED; W/BRONCHIAL ALVEOLAR LAVAGE WITH MODERATE SEDATION;  Surgeon: Dellis Filbert, MD;  Location: [facility] PROCED[facility];  Service: Pulmonary     Family History   Problem Relation Age of Onset   ??? Melanoma Neg Hx    ??? Basal cell carcinoma Neg Hx    ??? Squamous cell carcinoma Neg Hx      Social History     Socioeconomic History   ??? Marital status: Married   Tobacco Use   ??? Smoking status: Every Day     Packs/day: 2.00     Types: Cigarettes     Last attempt to quit: 01/20/2020     Years since quitting: 1.4   ??? Smokeless tobacco: Former     Types: Chew   Vaping Use   ??? Vaping Use: Never used   Substance and Sexual Activity   ??? Alcohol use: Not Currently   Other Topics Concern   ??? Do you use sunscreen? No   ??? Tanning bed use? No   ??? Are you easily burned? Yes   ??? Excessive sun exposure? No   ??? Blistering sunburns? Yes     Social Determinants of Health  Financial Resource Strain: Low Risk    ??? Difficulty of Paying Living Expenses: Not very hard   Food Insecurity: No Food Insecurity   ??? Worried About Running Out of Food in the Last Year: Never true   ??? Ran Out of Food in the Last Year: Never true   Transportation Needs: No Transportation Needs   ??? Lack of Transportation (Medical): No   ??? Lack of Transportation (Non-Medical): No     Review of Systems  80, Normal activity with effort; some signs or symptoms of disease (ECOG equivalent 1)  Constitutional: Denies fever, chills, sweats, unexplained weight loss   Eyes: Denies vision changes, eye pain, eye redness   ENT: Denies headaches, sore throat, hoarseness, sneezing, vertigo, hearing loss, tinnitus, neck pain, stiffness, dizziness, tooth pain, gum pain, mouth ulcers   Skin: Denies rashes, sores, jaundice, itching, dryness   Cardiovascular: Denies chest pain, shortness of breath (at rest or with exertion), edema   Pulmonary: Reports SOB.   Endocrine: Denies polyuria, polydipsia, heat/cold intolerance   Gastrointestinal: Denies heartburn, early satiety, nausea, vomiting, abdominal pain, changes in bowel habits, blood per rectum  Genito-urinary: Denies frequency, urgency, dysuria, hematuria, nocturia   Musculoskeletal: Denies myalgias, arthralgias, joint swelling, joint pain   Neurologic:  Denies numbness, tingling, weakness, changes in gait, confusion, slurred speech   Psychology:  Reports anxiety, insomnia   Heme/Lymph: As per HPI/PMH      Physical Exam  BP 142/96  - Pulse 90  - Temp 36.2 ??C (97.1 ??F) (Temporal)  - Resp 16  - Ht 187 cm (6' 1.62)  - Wt (!) 113.4 kg (250 lb)  - SpO2 95%  - BMI 32.43 kg/m??   General appearance - Obese male, sitting upright in exam chair in no acute distress.   Mental status - alert, oriented to person, place, and time, normal mood, behavior, speech, dress, motor activity, and thought processes  Eyes - pupils equal and reactive, extraocular eye movements intact, sclera anicteric  Nose - normal and patent, no erythema, discharge or polyps  Mouth - mucous membranes moist, pharynx normal without lesions  Neck - supple  Lymphatics - no palpable lymphadenopathy  Pulmonary - Respirations unlabored on room air, no crackles or wheezing.   Cardiovascular-  Normal rate, regular rhythm. No murmur.   Gastrointestinal - Soft, non-tender, non-distended   Neurological - alert, oriented, normal speech, no focal findings or movement disorder noted  Musculoskeletal - no joint tenderness, deformity or swelling  Extremities - peripheral pulses normal, no pedal edema, no clubbing or cyanosis, no pedal edema noted  Skin - normal coloration and turgor, no rashes, no suspicious skin lesions noted    Data  WBC   Date Value Ref Range Status   07/08/2021 11.1 3.6 - 11.2 10*9/L Final   04/26/2021 12.7 (H) 3.4 - 10.8 x10E3/uL Final     HGB   Date Value Ref Range Status   07/08/2021 15.2 12.9 - 16.5 g/dL Final   56/21/3086 57.8 13.0 - 17.7 g/dL Final     Hemoglobin   Date Value Ref Range Status   03/11/2021 9.3 (L) 13.5 - 17.5 g/dL Final     HCT   Date Value Ref Range Status   07/08/2021 43.9 39.0 - 48.0 % Final   04/26/2021 38.6 37.5 - 51.0 % Final     Platelet   Date Value Ref Range Status   07/08/2021 240 150 - 450 10*9/L Final   04/26/2021 278 150 - 450 x10E3/uL Final  Absolute Neutrophils   Date Value Ref Range Status   07/08/2021 6.9 1.8 - 7.8 10*9/L Final   04/26/2021 7.1 (H) 1.4 - 7.0 x10E3/uL Final     Absolute Eosinophils   Date Value Ref Range Status   07/08/2021 0.8 (H) 0.0 - 0.5 10*9/L Final   04/26/2021 1.0 (H) 0.0 - 0.4 x10E3/uL Final       Sodium   Date Value Ref Range Status   07/08/2021 141 135 - 145 mmol/L Final   04/26/2021 141 134 - 144 mmol/L Final     Sodium Whole Blood   Date Value Ref Range Status   03/11/2021 137 135 - 145 mmol/L Final     Potassium   Date Value Ref Range Status   07/08/2021 4.1 3.4 - 4.8 mmol/L Final   04/26/2021 4.3 3.5 - 5.2 mmol/L Final     Potassium, Bld   Date Value Ref Range Status   03/11/2021 4.0 3.4 - 4.6 mmol/L Final     Chloride   Date Value Ref Range Status   07/08/2021 110 (H) 98 - 107 mmol/L Final   04/26/2021 101 96 - 106 mmol/L Final     CO2   Date Value Ref Range Status   07/08/2021 21.0 20.0 - 31.0 mmol/L Final   04/26/2021 20 20 - 29 mmol/L Final     BUN   Date Value Ref Range Status   07/08/2021 18 9 - 23 mg/dL Final   64/40/3474 21 6 - 24 mg/dL Final     Creatinine   Date Value Ref Range Status   07/08/2021 1.77 (H) 0.60 - 1.10 mg/dL Final   25/95/6387 5.64 (H) 0.76 - 1.27 mg/dL Final     Glucose   Date Value Ref Range Status   07/08/2021 115 70 - 179 mg/dL Final     Calcium   Date Value Ref Range Status   07/08/2021 8.9 8.7 - 10.4 mg/dL Final   33/29/5188 9.7 8.7 - 10.2 mg/dL Final     Magnesium   Date Value Ref Range Status   06/24/2021 1.7 1.6 - 2.6 mg/dL Final       Total Bilirubin   Date Value Ref Range Status   07/08/2021 0.2 (L) 0.3 - 1.2 mg/dL Final   41/66/0630 0.2 0.0 - 1.2 mg/dL Final     Total Protein   Date Value Ref Range Status   07/08/2021 6.4 5.7 - 8.2 g/dL Final   16/04/930 6.0 6.0 - 8.5 g/dL Final     Albumin   Date Value Ref Range Status   07/08/2021 4.1 3.4 - 5.0 g/dL Final     ALT   Date Value Ref Range Status   07/08/2021 42 10 - 49 U/L Final   04/26/2021 15 0 - 44 IU/L Final     AST   Date Value Ref Range Status   07/08/2021 28 <=34 U/L Final   04/26/2021 13 0 - 40 IU/L Final     Alkaline Phosphatase   Date Value Ref Range Status   07/08/2021 81 46 - 116 U/L Final   04/26/2021 116 44 - 121 IU/L Final     LDH   Date Value Ref Range Status   02/11/2021 225 120 - 246 U/L Final        Impression and Recommendations  Moses Odoherty is a 41 yo male smoker with Ph(+) B-ALL, who presents for evaluation for allogeneic stem cell transplantation.    The allo-HCT is a long-established and potent  strategy in treatment of Ph+ALL, one that for years offered the only chance of cure. With invention of TKI, majority of patients with Ph+ALL can enjoy long survival with TKI and chemotherapy even without transplant. The role of allo-SCT these days is less well defined.   The long term survival with TKI and hyper-CVAD reported by [facility] and SWOG investigators is certainly encouraging: while consolidation with an allo-SCT appears to increase a chance of cure, particularly for younger patients who are good candidates for transplant, many patients who did not receive transplant can survive long term and may be cured without transplant. In a SWOG 0805 study of dasatinib and hyperCVAD, which accrued 1 younger patients (median age 29, range 20-60 years), 49 underwent an alloHCT in first CR, followed by dasatinib maintenance. With median follow up of 36 mo (range 9-63), the OS and EFS at 3 years were 69% and 62%, respectively. The landmark analysis at 175 days from the time of CR (which was the longest time to alloHCT) showed a statistically superior RFS and OS in favor of transplant (P = .038 and P = .037, respectively).  In fact, Short et al (Blood 2016 128(4):504-7) reported that patients who achieve a CMR or MMR with TKI and hyperCVAD by 3 mo, can expect 63% and 66% of RFS and left eye without transplant.  CMR in that study was defined as the absence of a detectable BCR-ABL1 transcript with a sensitivity of 0.01%. MMR was defined as a BCR-ABL1:ABL1 ratio ?0.1% on the International Scale for p210 BCR-ABL1 or a 3-log reduction in transcripts for p190 BCR-ABL1, but not meeting criteria for CMR.     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 Mr. Toft eventually achieved a CMR, but he has had detectable disease for over a year now and I would be concerned that that does not fair well. He may be able to maintain a remission for a prolonged period of time but I do not think it is very likely that he could get cured with any type of therapy short of transplant.    On the other hand the complications he experienced in the course of relatively low intensity and generally well tolerated therapy are very impressive. This certainly raises the question if he even stands a chance of surviving transplant.     We reviewed with the patient and his wife daughter the logistics of treatment with an allogeneic stem cell trasplantation.  I discussed the process of administration of preparative regimen, infusion of donor???s stem cells, management of aplasia that follows administration of preparative regimen and the use of immunotherapy.  I explained that allogeneic HSCT is associated with a 20-40% risk of treatment related mortality, and could result in substantial morbidity. I went over the toxicities of transplant, which include but are not necessarily limited to: bleeding, infection, GVHD, graft failure, direct organ toxicity from the conditioning regimen (VOD, lung injury), N/V/D, mucositis, and alopecia. I also explained that the procedure would require a one month hospitalization, and extensive outpatient follow-up after hospitalization. I explained that the latter would involve physician visit 2-3 times per week, and that this would continue for at least 100 days after transplant. I also went over the need for a full  time caregiver(s) to stay with the patient during this period of very intense outpatient follow-up.         Given his poor tolerance of lower intensity therapy and the complications with transplant experienced by his friend, Mr. Kille is very reluctant to consider the procedure.  He is continuing to recover from his hospitalizations with persistent decreased exertional tolerance. He feels he is slowly getting his life back and is not eager to undergo an intensive treatment course.      Additional considerations would be whether he would be a candidate for a fully myeloablative conditioning regimen such as TBI + Cy versus a lower intensity regimen. A haploidentical transplant is a possibility, given he does not have siblings but may have a daughter who is able to donate. He would prefer she not be involved in this process until he has made the decision to move forward with transplant.     The plan we made today is as follows:   - Initiate donor search: Proceed with HLA-typing and search the NMDP Registry for donors. Delay HLA-typing typing of one of the two daughters who would be able to donate. No siblings.     - Communicate with ICID to determine what can be done to prevent future MRSA infections, particularly in face of possible allo-SCT.   - Patient will continue maintenance therapy with ponatinib. He and his wife are adamant that he is very complain with it. He is using a pill box and would be immediately aware if he were skipping doses.   - Mr. Rise feels that he needs at least a few months to recover from complications of his prior therapy physically and mentally. We are very sorry that his friend had such a difficult course of transplant. While transplant certainly does come with high risk of treatment related morbidity and mortality, majority of patients experience side effects and complications that could be managed quite effectively. We would plan to provide Mr. Shavers with contact for a few patients who underwent transplant for his type of disease.     We would plan to meed in 2-3 months period of time to discuss transplant again.         Napoleon Form, MD   [facility]    I personally saw and examined this patient. I participated in the key portions of the service. I have reviewed and edited the Fellow's note and agree with the documented findings and plan. The note reflects my participation and input in the interval history, physical exam, and complex medical decision making. It was medically necessary for me to see the patient due to complex medical issues in the setting of evaluation for possible treatment with an allo-SCT.   I personally spent over 120 minutes face-to-face and non-face-to-face in the care of this patient, which includes all pre, intra, and post visit time on the date of service.  All documented time was specific to the E/M visit and does not include any procedures that may have been performed.    Lanae Boast, MD  Associate Professor  Hematology [facility]

## 2021-07-14 ENCOUNTER — Other Ambulatory Visit: Admit: 2021-07-14 | Discharge: 2021-07-14 | Payer: MEDICAID

## 2021-07-14 ENCOUNTER — Encounter: Admit: 2021-07-14 | Discharge: 2021-07-14 | Payer: MEDICAID

## 2021-07-14 ENCOUNTER — Ambulatory Visit: Admit: 2021-07-14 | Discharge: 2021-07-14 | Payer: MEDICAID

## 2021-07-14 ENCOUNTER — Institutional Professional Consult (permissible substitution): Admit: 2021-07-14 | Discharge: 2021-07-14 | Payer: MEDICAID

## 2021-07-14 DIAGNOSIS — C9101 Acute lymphoblastic leukemia, in remission: Principal | ICD-10-CM

## 2021-07-14 DIAGNOSIS — Z7682 Awaiting organ transplant status: Principal | ICD-10-CM

## 2021-07-14 NOTE — Unmapped (Signed)
[facility] Refill Coordination Note    Specialty Medication(s) to be Shipped:   Hematology/Oncology: Iclusig    Other medication(s) to be shipped: No additional medications requested for fill at this time     Adam Keith, DOB: 1980-04-30  Phone: [phone] (home)       All above HIPAA information was verified with patient.     Was a Nurse, learning disability used for this call? No    Completed refill call assessment today to schedule patient's medication shipment from the [facility] [phone]).  All relevant notes have been reviewed.     Specialty medication(s) and dose(s) confirmed: Regimen is correct and unchanged.   Changes to medications: [name] reports no changes at this time.  Changes to insurance: No  New side effects reported not previously addressed with a pharmacist or physician: None reported  Questions for the pharmacist: No    Confirmed patient received a Conservation officer, historic buildings and a Surveyor, mining with first shipment. The patient will receive a drug information handout for each medication shipped and additional FDA Medication Guides as required.       DISEASE/MEDICATION-SPECIFIC INFORMATION        N/A    SPECIALTY MEDICATION ADHERENCE     Medication Adherence    Patient reported X missed doses in the last month: 0  Specialty Medication: Iclusig 30 mg  Patient is on additional specialty medications: No  Informant: patient              Were doses missed due to medication being on hold? No    Iclusig 30 mg: 10 days of medicine on hand       REFERRAL TO PHARMACIST     Referral to the pharmacist: Not needed      [facility]     Shipping address confirmed in Epic.     Delivery Scheduled: Yes, Expected medication delivery date: 07/20/21.     Medication will be delivered via UPS to the prescription address in Epic [place].    [name]   [facility] Specialty Technician

## 2021-07-14 NOTE — Unmapped (Signed)
Buccal swab obtained from patient, patient tolerated well. Sent to core lab.

## 2021-07-14 NOTE — Unmapped (Signed)
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  Patient arrived with spouse, [name]. Provided  BMT Patient Handbook, National Marrow Donor Program website information and coordinator contact information.  Fertility preservation information was given. Provided pre-transplant teaching regarding purpose of a BMT, HLA typing, donor search, timing as conveyed by Dr.Jamieson, hospitalization requirements during transplant, post-transplant expectations including 24/7 caregiver and the need to remain in the [facility] area for at least the first 100 days as well as long-term follow-up requirements.      Patient has an upcoming ICID appt on July 21st and would like to meet again with the transplant team afterwards for further discussions and to decide whether he'd proceed with allo transplant testing.  TNC will schedule.  He has agreed to have HLA typing completed today.  Will initiate NMDP donor review once resulted.  If he decides to proceed with transplant, he will then provide family information for typing.  He does not want to proceed with this just yet.  He has 2 children but states due to mental health may only type one.  He does have a half-sibling but is not sure if she will agree to donate.  Parents are reportedly not in good health. Patient will return his HLA form once he decides to proceed and speaks to family members regarding potential donation.  Per Dr. Oswald Hillock he has a friend who has had complications since his transplant so she'd like to connect him with other post-transplant patients to support him.  He does currently smoke cigarettes.  He was informed that smoking cessation would be a requirement for transplant candidacy.  He verbalized that the smoking cessation program has been calling him.      Patient verbalized an understanding of teaching and will contact me for further information prn.     Time spent with patient: 35 minutes.

## 2021-07-16 DIAGNOSIS — G893 Neoplasm related pain (acute) (chronic): Principal | ICD-10-CM

## 2021-07-16 LAB — HLA ANTIBODY SCREEN

## 2021-07-16 NOTE — Unmapped (Signed)
Spoke with patient who would be interested in connecting with post AlloSCT patients if he decides to  proceed.  Spoke with 2 former transplant patients who both agree to have Mr. Schnoebelen call them for questions or for support.  Contact information for both gentlemen provided to Mr. Jewel.

## 2021-07-19 LAB — CMV IGG: CMV IGG: NEGATIVE

## 2021-07-19 MED ORDER — OXYCONTIN 15 MG TABLET,CRUSH RESISTANT,EXTENDED RELEASE
ORAL_TABLET | Freq: Two times a day (BID) | ORAL | 0 refills | 28 days | Status: CP
Start: 2021-07-19 — End: ?

## 2021-07-19 MED FILL — ICLUSIG 30 MG TABLET: ORAL | 30 days supply | Qty: 30 | Fill #1

## 2021-07-22 LAB — HLA CL I&II, HIGH RES

## 2021-07-22 LAB — HI RES SBT ABCDR

## 2021-07-25 NOTE — Unmapped (Unsigned)
Pulmonary Clinic - Follow-up Visit    Referring Physician :  Park Breed  PCP:     Mick Sell, MD  Reason for Consult:   DOE      ASSESSMENT and PLAN     Timoth Schara is a 41 y.o. 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    COPD 2B:  - Continue LABA/LAMA with Anoro - reviewed technique. Held on step up to ICS given ongoing infection.  - Repeat PFTs in 3 months  - Exacerbations: none so far  - Pulmonary Rehab:declines  - Lung Cancer Screening:not eligible  - Vaccines:declines  - Smoking cessation: Strongly encouraged but he declines.   - Hypercarbia/NIPPV screen: Declines currentlly   - Advanced therapies? Not indicated by PFTs  - START airway clearance with flutter valve to improve airway clearance. Consider nebs in the future.    Eosinophilia: Up to 1.5, down to 800  - CBC w diff today  - Will discuss with heme    MRSA Empyema:  - Resolved on most recent CT, but improving RUL MRSA pna  - Cont doxy suppression as per ICID    Tobacco Use: ~ 2ppd  - Cessation encouraged      Vaccines:  Immunization History   Administered Date(s) Administered Comments   ??? COVID-19 VACCINE,MRNA(MODERNA)(PF) 02/25/2020    ???  03/23/2020    Deferred Date(s) Deferred Comments   ??? COVID-19 VACC,MRNA,(PFIZER)(PF) 01/24/2020    ??? Influenza Vaccine Quad (IIV4 PF) 11mo+ injectable 04/08/2020    ???  03/20/2021            There are no diagnoses linked to this encounter.    Plan of care was discussed with the patient who acknowledged understanding and is in agreement.    Patient will return to clinic in 3 months or sooner if needed.    This patient was seen and discussed with attending physician, Dr. Etta Quill who agrees with the assessment and plan above.     ZO:XWRU Minna Antis, Mick Sell, MD    Burman Riis Laymon Stockert MD  Pulmonary and Critical Care Fellow  07/25/21   ~~~~~    HISTORY:     History of Present Illness:  Mr. Ly is a 41 y.o. male with a history of the above whom we are seeing in consultation requested by Park Breed for evaluation of DOE.     Patient developed DOE after his cancer diagnosis and chemotherapy. Trouble breathing since MRSA infection. Not SOB, more DOE. Takes shallower breaths due to pain in his right side. It takes him longer to recover. 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 Now he can't do a flight of stairs without stopping.    Currently smokes 2ppd and has done so for 10-20 years. Not interested in quitting for everyone else's sake.    Hearing loss, wife assists with meds and history.    Discussed therapies and implications of smoking. Recommended an inhaler as well as close follow-up for progression.     Interval 05/24/21:  - Admitted 03/06/21 for MRSA ONA after flu ICU stay with intubation.  - Using inhaler intermittently due to taste and powdery leftovers.   -  Went a week without Anoro, and he felt more wheezy and SOB even between rooms. Wheezing was a big concern even at night.   - Not intersted in smoking cessation at this time, it feels like a nice thing for him when he's hurting and down in his mood.   - Discussed airway clearance    Interval 07/26/21  ***      Past Medical History:  Past Medical History:   Diagnosis Date   ??? 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 BONE MARROW BIOPSY & ASPIRATION  01/21/2020        ??? CHG [place], CHEST,REAL TIME  11/20/2020    Procedure: ULTRASOUND, CHEST, REAL TIME WITH IMAGE DOCUMENTATION;  Surgeon: Jerelyn Charles, MD;  Location: [facility] PROCEDURE LAB [facility];  Service: Pulmonary   ??? IR INSERT PORT AGE GREATER THAN 5 YRS  03/10/2020 IR INSERT PORT AGE GREATER THAN 5 YRS 03/10/2020 [name], MD IMG VIR [facility]   ??? PR BRONCHOSCOPY,DIAGNOSTIC W LAVAGE Bilateral 07/20/2020    Procedure: BRONCHOSCOPY, RIGID OR FLEXIBLE, INCLUDE FLUOROSCOPIC GUIDANCE WHEN PERFORMED; W/BRONCHIAL ALVEOLAR LAVAGE WITH MODERATE SEDATION;  Surgeon: Dellis Filbert, MD;  Location: [facility] PROCEDURE LAB [facility];  Service: Pulmonary       Other History:  The social history and family history were personally reviewed and updated in the patient's electronic medical record.    Family History   Problem Relation Age of Onset   ??? Melanoma Neg Hx    ??? Basal cell carcinoma Neg Hx    ??? Squamous cell carcinoma Neg Hx      Social History     Socioeconomic History   ??? Marital status: Married   Tobacco Use   ??? Smoking status: Every Day     Packs/day: 2.00     Types: Cigarettes     Last attempt to quit: 01/20/2020     Years since quitting: 1.5   ??? Smokeless tobacco: Former     Types: Chew   Vaping Use   ??? Vaping Use: Never used   Substance and Sexual Activity   ??? Alcohol use: Not Currently   Other Topics Concern   ??? Do you use sunscreen? No   ??? Tanning bed use? No   ??? Are you easily burned? Yes   ??? Excessive sun exposure? No   ??? Blistering sunburns? Yes     Social Determinants of Health     Financial Resource Strain: Low Risk    ??? Difficulty of Paying Living Expenses: Not very hard   Food Insecurity: No Food Insecurity   ??? Worried About Running Out of Food in the Last Year: Never true   ??? Ran Out of Food in the Last Year: Never true   Transportation Needs: No Transportation Needs   ??? Lack of Transportation (Medical): No   ??? Lack of Transportation (Non-Medical): No       Home Medications:  Current Outpatient Medications on File Prior to Visit   Medication Sig Dispense Refill   ??? acetaminophen (TYLENOL) 325 MG tablet Take 2 tablets (650 mg total) by mouth every six (6) hours as needed for pain.     ??? albuterol HFA 90 mcg/actuation inhaler Inhale 2 puffs every six (6) hours as needed for wheezing. 8 g 11   ??? amLODIPine (NORVASC) 10 MG tablet Take 1 tablet (10 mg total) by mouth daily.  30 tablet 0   ??? arm brace (WRIST BRACE) Misc 1 Piece by Miscellaneous route as needed. left ulnar wrist brace 1 each 0   ??? aspirin 81 MG chewable tablet Chew 1 tablet (81 mg total) daily. 36 tablet 11   ??? carvediloL (COREG) 12.5 MG tablet Take 1 tablet (12.5 mg total) by mouth Two (2) times a day. 180 tablet 1   ??? chlorhexidine (HIBICLENS) 4 % external liquid Apply topically daily. as directed 946 mL 0   ??? clonazePAM (KLONOPIN) 0.5 MG tablet Take 1 tablet (0.5 mg total) by mouth two (2) times a day as needed for anxiety or sleep. 60 tablet 1   ??? doxycycline (VIBRA-TABS) 100 MG tablet Take 1 tablet (100 mg total) by mouth Two (2) times a day. 180 tablet 3   ??? gabapentin (NEURONTIN) 300 MG capsule Take 2 capsules (600mg ) by mouth in the morning and 3 capsules (900mg ) at bedtime. May cause drowsiness. 150 capsule 5   ??? HYDROmorphone (DILAUDID) 2 MG tablet Take 1 tablet (2 mg total) by mouth two (2) times a day as needed. 56 tablet 0   ??? mirtazapine (REMERON) 15 MG tablet TAKE 1 TABLET BY MOUTH EVERY DAY AT NIGHT 90 tablet 4   ??? mucus clearing device Devi 1 each by Miscellaneous route Two (2) times a day. 1 each 0   ??? mupirocin (BACTROBAN) 2 % ointment Apply topically daily. apply in each nostril prior to bed, as directed 15 g 0   ??? nortriptyline (PAMELOR) 50 MG capsule Take 2 capsules (100 mg total) by mouth nightly.     ??? OXYCONTIN 15 mg 12 hr crush resistant ER/CR tablet Take 1 tablet (15 mg total) by mouth Two (2) times a day. 56 tablet 0   ??? PONATinib (ICLUSIG) 30 mg tablet Take 1 tablet (30 mg total) by mouth daily. Swallow tablets whole. Do not crush, break, cut or chew tablets. 30 tablet 5   ??? sulfamethoxazole-trimethoprim (BACTRIM DS) 800-160 mg per tablet Take 1 tablet (160 mg of trimethoprim total) by mouth 2 times a day on Saturday, Sunday. For prophylaxis while on chemo. 48 tablet 3   ??? umeclidinium-vilanteroL (ANORO ELLIPTA) 62.5-25 mcg/actuation inhaler Inhale 1 puff daily. 60 each 11   ??? valACYclovir (VALTREX) 500 MG tablet Take 1 tablet (500 mg total) by mouth daily. 90 tablet 11     No current facility-administered medications on file prior to visit.       Allergies:  Allergies as of 07/26/2021 - Reviewed 07/14/2021   Allergen Reaction Noted   ??? Bupropion hcl Other (See Comments) 12/23/2019   ??? Cefepime Rash 11/04/2020   ??? Ceftaroline fosamil Rash and Other (See Comments) 03/03/2020   ??? Dapsone Other (See Comments) and Anaphylaxis 02/29/2020   ??? Onion Anaphylaxis 01/26/2020   ??? Vancomycin analogues  03/16/2020   ??? Bismuth subsalicylate Nausea And Vomiting 08/07/2013   ??? Privigen [immun glob g(igg)-pro-iga 0-50] Other (See Comments) 03/26/2021   ??? Furosemide  04/13/2020       Review of Systems:  A comprehensive review of systems was completed and negative except as noted in HPI.    PHYSICAL EXAM:   There were no vitals taken for this visit.  GEN: NAD, sitting in chair, friendly  EYES: EOMI, sclera anicteric  ENT: Trachea midline, MMM  CV: RRR, no murmurs appreciated  PULM: CTA B, normal WoB, no stridor, improved air movement, no wheeze***  ABD: soft, non-tender, non-distended  EXT: No edema  NEURO:  Grossly Non-focal, moving all extremities normally  PSYCH: A+Ox3, appropriate  MSK: no obvious joint deformities of b/l hands      LABORATORY and RADIOLOGY DATA:     Pulmonary Function Tests/Interpretation:      01/25/21 - My interp: moderate obstructive lung disease without significant bronchodilator response. Severely reduced DLCO.       Pertinent Laboratory Data:    Personally reviewed in EMR    Pertinent Imaging Data:  Personally reviewed in EMR    CT Chest 8/17 to 9/26 improvement

## 2021-07-26 MED ORDER — TRIAMCINOLONE ACETONIDE 0.1 % TOPICAL CREAM
Freq: Two times a day (BID) | TOPICAL | 1 refills | 0 days | Status: CP
Start: 2021-07-26 — End: 2021-08-16

## 2021-07-28 ENCOUNTER — Telehealth
Admit: 2021-07-28 | Discharge: 2021-07-29 | Payer: MEDICAID | Attending: Student in an Organized Health Care Education/Training Program | Primary: Student in an Organized Health Care Education/Training Program

## 2021-07-28 DIAGNOSIS — C9101 Acute lymphoblastic leukemia, in remission: Principal | ICD-10-CM

## 2021-07-28 DIAGNOSIS — Z515 Encounter for palliative care: Principal | ICD-10-CM

## 2021-07-28 DIAGNOSIS — F32A Depression, unspecified depression type: Principal | ICD-10-CM

## 2021-07-28 DIAGNOSIS — G893 Neoplasm related pain (acute) (chronic): Principal | ICD-10-CM

## 2021-07-28 MED ORDER — OXYCONTIN 15 MG TABLET,CRUSH RESISTANT,EXTENDED RELEASE
ORAL_TABLET | Freq: Two times a day (BID) | ORAL | 0 refills | 28.00000 days | Status: CP
Start: 2021-07-28 — End: ?

## 2021-07-28 NOTE — Unmapped (Signed)
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 He was recently admitted to [facility] with with complicated PNA requiring intubation. Seen today for evaluation of LUE pain.    Assessment/Plan:     # Depressed Mood: Improved since last visit.  Still intermittent periods of depression but feels like he is doing better. Depression is primarily associated with patient's constant physical setbacks, his inability to engage in activities he enjoys, and his general isolation d/t fears about getting sick. His wife is his primary support, and he also follows with psych and a therapist.    -Continue mirtazapine 15 mg nightly, managed by [name]  -Consider r the [employer] [facility]    #Pain: Right lower extremity pain resolved.  Now predominantly has chronic back pain.  Very motivated to get off of opioids given his goal is to return to work and he worries that this is not allowed for his job.  Encouraged him to reach out to his supervisor to discuss.  Discussed possibility of titrating off of opioids over time versus switching to partial opioid agonist such as buprenorphine, consider Butrans patch  -Continue gabapentin to 600mg  qAM and 900mg  qbedtime  -No longer using Dilaudid dilaudid 2mg  PO BID PRN       - PDMP reviewed, refilled today, ok to fill today  - Continue oxycontin 15mg  PO BID       - PDMP reviewed, refills sent for May and June  - Referral to cancer rehab, has not seen yet    # LUE pain: Improved.   - Pain management as above    #Chronic pain: At baseline, see above  - Pain management as above    #Advance care planning: Reviewed today the difficult decision of deciding whether or not to pursue bone marrow transplant or not.  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  We will have to stop woodworking for at least a year if he pursues transplant.  Reviewed that the benefits of transplant is that it would hopefully give him significantly more time where he could be functional, productive and active, although the timeline would be delayed.  After discussion recommended he can pursue transplant, but he is still considering.  Told him I am available for further discussions if helpful.  He is waiting for infectious disease to weigh in about risk of future infections if he pursues transplant.    #Controlled substances risk management:  ??? Patient does not have a signed pain medication agreement with our team.  ??? [place] database was reviewed today and it was??appropriate.  ??? Urine drug screen was not??performed at this visit. Findings: not applicable.  ??? Patient has received information about safe storage and administration of medications.  ??? Patient has not??received a prescription for narcan; is not applicable.     F/u: 4-6 weeks  ----------------------------------------  Referring Provider:??Dr. Berline Lopes  Oncology Team: Malignant hematology team  PCP:??DAVID Adam Sites, MD    HPI: 41 year old man with a diagnosis of B-ALL, diagnosed in October 2021, now in complete remission.??Recently admitted to the [facility] ICU 03/06/21-03/20/21 for AHRF d/t MRSA/klebsiella PNA, requiring intubation. This is following admissions in August, May, April, March, February, and January 2022.    Interval History:   Says he's hanging in there. Had been sleeping up to 17-18 hours a day, felt  like it was related to his Oxycodone. He has stopped taking that and feels like he has felt a little bit better. Still taking Oxycontin 15mg  twice a day. Describes his pain as being about the same as it was. It's the chronic back and hip pain that has been the primary problems.     Overall he feels like his mood is a little better. Feels like his mood is very cyclical. Major stressors are not being able to be active, and work in his shop. Has been worrying a lot about doing a bone marrow transplant. Has heard that if he doesn't get a transplant then he knows that his cancer will come back at some point. Feels very worried about the side effects of transplant.    Waiting on ID doc to come back into town. Has some question about using CBD for pain and anxiety. His goal is to get back to work and is worried that he can't be on opioids.       Palliative Performance Scale: 80% - Ambulation: Full / Normal Activity with effort, some evidence of disease / Self-Care:Full / Intake: Normal or reduced / Level of Conscious: Full    Coping/Support Issues: Having a lot of difficulty coping with his current setback. Well supported by his wife. Also has a psychiatrist and therapist.  ??  Goals of Care: Treatment and cancer-directed therapy  ??  Social History:  Name of primary support: [name]  Occupation: Was in the [employer]	[place]  Hobbies: Training and development officer  Current residence / distance from [facility]: [place]    Has a 44 year old son.   ??  Advance Care Planning: Did not discuss this visit.    Objective     Allergies:   Allergies   Allergen Reactions   ??? Bupropion Hcl Other (See Comments)     Per patient out of touch with reality, suicidal, homicidal   ??? Cefepime Rash     DRESS   ??? Ceftaroline Fosamil Rash and Other (See Comments)     Rash X 2 02/2020, suspected DRESS 06/2020   ??? 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 Onion Anaphylaxis   ??? Vancomycin Analogues      Hearing loss with Lasix  Other reaction(s): Other (See Comments)  Hearing loss  lasix   ??? Bismuth Subsalicylate Nausea And Vomiting   ??? Privigen [Immun Glob G(Igg)-Pro-Iga 0-50] Other (See Comments)     03/10/2021 VIG infusion stopped as patient developed rigors, tachycardia and HTN during infusion despite pre-medication; 03/12/21 completed IVIG with premedications and slower rate of infusion   ??? Furosemide      With Vancomycin caused hearing loss  Other reaction(s): Other (See Comments)  With Vancomycin caused hearing loss       Family History:  Cancer-related family history is negative for Melanoma.  He indicated that the status of his neg hx is unknown.      REVIEW OF SYSTEMS:  A comprehensive review of 10 systems was negative except for pertinent positives noted in HPI.    Lab Results   Component Value Date    CREATININE 1.77 (H) 07/08/2021     Lab Results   Component Value Date    ALKPHOS 81 07/08/2021    BILITOT 0.2 (L) 07/08/2021    BILIDIR 0.30 03/20/2021    PROT 6.4 07/08/2021    ALBUMIN 4.1 07/08/2021    ALT 42 07/08/2021    AST 28  07/08/2021     Physical exam:  General: Overall well-appearing man in no acute distress, wearing sunglasses, standing in wood shop  Pulmonary: No increased work of breathing  Neurological: Normal gait, no acute deficits  Psychiatric: Mood up-and-down affect appropriate and full range         The patient reports they are currently: at home. I spent 30 minutes on the real-time audio and video with the patient on the date of service. I spent an additional 8 minutes on pre- and post-visit activities on the date of service.     The patient was physically located in [place] or a state in which I am permitted to provide care. The patient and/or parent/guardian understood that s/he may incur co-pays and cost sharing, and agreed to the telemedicine visit. The visit was reasonable and appropriate under the circumstances given the patient's presentation at the time.    The patient and/or parent/guardian has been advised of the potential risks and limitations of this mode of treatment (including, but not limited to, the absence of in-person examination) and has agreed to be treated using telemedicine. The patient's/patient's family's questions regarding telemedicine have been answered.     If the visit was completed in an ambulatory setting, the patient and/or parent/guardian has also been advised to contact their provider???s office for worsening conditions, and seek emergency medical treatment and/or call 911 if the patient deems either necessary.    Kathlynn Grate, MD  [facility] Outpatient Oncology Palliative Care

## 2021-07-29 NOTE — Unmapped (Signed)
Spoke with Adam Keith to provide NMDP donor search results.  He has numerous young 10/10 matches in the registry. He will follow up if he has any concerns or questions.

## 2021-08-03 DIAGNOSIS — F419 Anxiety disorder, unspecified: Principal | ICD-10-CM

## 2021-08-03 DIAGNOSIS — C9100 Acute lymphoblastic leukemia not having achieved remission: Principal | ICD-10-CM

## 2021-08-03 MED ORDER — CLONAZEPAM 0.5 MG TABLET
ORAL_TABLET | Freq: Two times a day (BID) | ORAL | 1 refills | 30 days | Status: CP | PRN
Start: 2021-08-03 — End: ?

## 2021-08-06 ENCOUNTER — Encounter: Admit: 2021-08-06 | Discharge: 2021-08-06 | Payer: MEDICAID | Attending: Internal Medicine | Primary: Internal Medicine

## 2021-08-09 DIAGNOSIS — Z005 Encounter for examination of potential donor of organ and tissue: Principal | ICD-10-CM

## 2021-08-09 NOTE — Unmapped (Signed)
[facility] Pharm[facility] Refill Coordination Note    Specialty Medication(s) to be Shipped:   Hematology/Oncology: Iclusig    Other medication(s) to be shipped: No additional medications requested for fill at this time     Adam Keith, DOB: Aug 10, 1980  Phone: [phone] (home)       All above HIPAA information was verified with patient.     Was a Nurse, learning disability used for this call? No    Completed refill call assessment today to schedule patient's medication shipment from the [facility] [phone]).  All relevant notes have been reviewed.     Specialty medication(s) and dose(s) confirmed: Regimen is correct and unchanged.   Changes to medications: Adam Keith reports no changes at this time.  Changes to insurance: No  New side effects reported not previously addressed with a pharmacist or physician: None reported  Questions for the pharmacist: No    Confirmed patient received a Conservation officer, historic buildings and a Surveyor, mining with first shipment. The patient will receive a drug information handout for each medication shipped and additional FDA Medication Guides as required.       DISEASE/MEDICATION-SPECIFIC INFORMATION        N/A    SPECIALTY MEDICATION ADHERENCE     Medication Adherence    Patient reported X missed doses in the last month: 0  Specialty Medication: Iclusig 30 mg  Patient is on additional specialty medications: No  Informant: patient              Were doses missed due to medication being on hold? No    Iclusig 30 mg: 14 days of medicine on hand       REFERRAL TO PHARMACIST     Referral to the pharmacist: Not needed      [facility]     Shipping address confirmed in Epic.     Delivery Scheduled: Yes, Expected medication delivery date: 08/20/21.     Medication will be delivered via UPS to the prescription address in Epic [place].    [name]   [facility] Specialty Technician

## 2021-08-11 ENCOUNTER — Encounter: Admit: 2021-08-11 | Discharge: 2021-08-11 | Payer: MEDICAID | Attending: Internal Medicine | Primary: Internal Medicine

## 2021-08-12 DIAGNOSIS — Z005 Encounter for examination of potential donor of organ and tissue: Principal | ICD-10-CM

## 2021-08-13 ENCOUNTER — Telehealth: Admit: 2021-08-13 | Discharge: 2021-08-14 | Payer: MEDICAID

## 2021-08-13 DIAGNOSIS — F419 Anxiety disorder, unspecified: Principal | ICD-10-CM

## 2021-08-13 DIAGNOSIS — F330 Major depressive disorder, recurrent, mild: Principal | ICD-10-CM

## 2021-08-13 DIAGNOSIS — F4310 Post-traumatic stress disorder, unspecified: Principal | ICD-10-CM

## 2021-08-13 NOTE — Unmapped (Addendum)
It was a pleasure talking with you this morning.     We discussed how you are sleeping an average of 12 hours per night. You are taking several medications that are sedating (pain medication, nortriptyline, clonazepam, and mirtazapine). You may want to try decreasing your clonazepam use at bedtime to see if that decreases the amount of sleep each night. If you need the clonazepam at bedtime, it's ok to use it, but it may not be needed any longer.    Please feel free to reach out with any questions or concerns.    Best,  [name]      Comprehensive Cancer Support Program (CCSP) - Psychiatry Outpatient Clinic   After Visit Summary    It was a pleasure to see you today in the [facility]???s Comprehensive Cancer Support Program (CCSP). The CCSP is a multidisciplinary program dedicated to helping patients, caregivers, and families with cancer treatment, recovery and survivorship.      To schedule, cancel, or change your appointment:  Please call the [facility] schedulers at [phone], Monday through Friday 8AM - 5PM.  Someone will return your call within 24 hours.      If you have a question about your medicines or you need to contact your provider:  Please call the CCSP program coordinator, Myrene Galas, at [phone].     For after hours urgent issues, you may call [phone] or call the I need to talk line at [phone]-TALK ([phone]) anytime 24/7.    CCSP Patient and Family Resource Center: [phone].    CCSP Website:  [email or web address]    For prescription refills, please allow at least 24 hours (during business hours, M-F) for providers to call in refills to your pharmacy. We are generally unable to accommodate same-day requests for refills.     If you are taking any controlled substances (such as anxiety or sleep medications), you must use them as the directions say to use them. We generally do not provide early refills over the phone without clear reason, and it would be inappropriate to obtain the medications from other doctors. We routinely use the [place] controlled substance database to monitor prescription drug use.

## 2021-08-13 NOTE — Unmapped (Signed)
[facility]  Psychiatry   Established Patient E&M Service - Outpatient     This is a non-billable visit given that he was not able to connect via video and the visit was completed by telephone.    Assessment:    Adam Keith presents for follow-up evaluation. He states that overall his depression and anxiety have improved. Depressive and anxiety symptoms still occur intermittently. Sleeping now 12 hours per night. Suggested to patient that he stop his clonazepam at bedtime. Continuing his nortriptyline 100 mg and mirtazapine 15 mg at bedtime. Will reassess on 11/05/21.    Identifying Information:  Adam Keith is a 41 y.o. male with a history of PH+ B cell ALL,??recurrent??major depressive disorder, PTSD, anxiety, and insomnia.??He has??had??a long struggle with intermittent depressive episodes with one suicide attempt at age 4.??    Risk Assessment:  An assessment of suicide and violence risk factors was performed as part of this evaluation and is not significantly changed from the last visit.   While future psychiatric events cannot be accurately predicted, the patient does not currently require acute inpatient psychiatric care and does not currently meet [facility] involuntary commitment criteria.      Plan:    Problem 1:??Major Depressive Disorder  Status of problem:??chronic??with mild exacerbation  Interventions:??  ??? Continue??nortriptyline??100??mg??at bedtime.   ??? Monitor nortriptyline level given elevated creatinine levels.??Nortriptyline level drawn on 12/21/20 was 66 (Ref range 70-170).??  ??? Continue mirtazapine 15 mg at bedtime??  ??? Continue psychotherapy with local therapist  ??  Problem 2:??Anxiety  Status of problem:??chronic??with mild exacerbation  Interventions:??see MDD above  ??  Problem 3:??Insomnia  Status of problem:????chronic??and stable--now experiencing hypersomnia  Interventions:??  ??? Stop clonazepam at bedtime. Likely no longer needed now that he has mirtazapine and he is sleeping 12 hours per night.  ??? See mirtazapine plan above      Psychotherapy provided:  No billable psychotherapy service provided.    Patient has been given this writer's contact information as well as the [facility] Psychiatry urgent line number. The patient has been instructed to call 911 for emergencies.      Subjective:    Chief complaint:  Follow-up psychiatric evaluation for depression and anxiety    Interval History:   States that his mood is up and down. Denies anhedonia and feelings of hopelessness. Denies SI. Feelings of worthlessness improving. Anxiety is intermittent and improving overall. Sleeping 12 hours per night. Discussed with patient the potential of stopping his clonazepam due to the number of sedating medications along with his hypersomnia. Patient agrees to consider stopping the clonazepam.    Doing lots of woodworking, which has helped his mood and self-esteem.    Considering transplant, but worried about his history of infections and eager to talk with his infectious disease provider. Lots of anxiety about the potential for transplant and something going wrong. Wants to avoid extended hospitalizations. Also, a friend of his recently died after undergoing a transplant.    Prior Psychiatric Medication Trials:  bupropion (could not tolerate), duloxetine, mirtazapine (given during recent hospitalization), lorazepam    Psychiatric Review of Systems  ??? Depressive Symptoms: depressed mood is intermittent (improving overall), difficulty concentrating, fatigue, feelings of worthlessness/guilt (improving)  ??? Manic Symptoms:??N/A  ??? Psychosis Symptoms:??N/A  ??? Anxiety Symptoms:??difficulty controlling worry at times, easily fatigued, difficulty concentrating  ??? Panic Symptoms:??denies  ??? PTSD Symptoms:??no recent issues  ??? Sleep disturbance:??difficulty falling asleep, but sleeping 12 hours per night??  ??? Exercise:??Unable to engage  in a daily exercise routine.??      Objective:      Mental Status Exam:  Appearance:    unable to assess   Motor:   unable to assess   Speech/Language:    Normal rate, volume, tone, fluency   Mood:   OK   Affect:   Calm, Cooperative and Euthymic   Thought process and Associations:   Logical, linear, clear, coherent, goal directed   Abnormal/psychotic thought content:     Denies SI, HI, self harm, delusions, obsessions, paranoid ideation, or ideas of reference   Perceptual disturbances:     Denies auditory and visual hallucinations, behavior not concerning for response to internal stimuli     Other:   insight and judgment intact     I personally spent 30 minutes face-to-face and non-face-to-face in the care of this patient, which includes all pre, intra, and post visit time on the date of service.    Visit was completed by video (or phone) and the appropriate disclaimer has been included below.    The patient reports they are currently: at home. I spent 15 minutes on the phone with the patient on the date of service. I spent an additional 15 minutes on pre- and post-visit activities on the date of service.     The patient was physically located in [place] or a state in which I am permitted to provide care. The patient and/or parent/guardian understood that s/he may incur co-pays and cost sharing, and agreed to the telemedicine visit. The visit was reasonable and appropriate under the circumstances given the patient's presentation at the time.    The patient and/or parent/guardian has been advised of the potential risks and limitations of this mode of treatment (including, but not limited to, the absence of in-person examination) and has agreed to be treated using telemedicine. The patient's/patient's family's questions regarding telemedicine have been answered.     If the visit was completed in an ambulatory setting, the patient and/or parent/guardian has also been advised to contact their provider???s office for worsening conditions, and seek emergency medical treatment and/or call 911 if the patient deems either necessary.      Sheran Lawless, PMHNP  08/13/2021

## 2021-08-16 ENCOUNTER — Telehealth
Admit: 2021-08-16 | Discharge: 2021-08-17 | Payer: MEDICAID | Attending: Physical Medicine & Rehabilitation | Primary: Physical Medicine & Rehabilitation

## 2021-08-16 DIAGNOSIS — R52 Pain, unspecified: Principal | ICD-10-CM

## 2021-08-16 DIAGNOSIS — Z7182 Exercise counseling: Principal | ICD-10-CM

## 2021-08-16 DIAGNOSIS — R0600 Dyspnea, unspecified: Principal | ICD-10-CM

## 2021-08-16 DIAGNOSIS — M549 Dorsalgia, unspecified: Principal | ICD-10-CM

## 2021-08-16 LAB — HLA CL I&II, HIGH RES

## 2021-08-16 NOTE — Unmapped (Signed)
Physical Medicine and Rehab  Clinic Note          Chief Complaint: back pain    History of Present Illness: Adam Keith is a 42 y.o. male with a past medical history of PH+ B cell ALL s/p chemo presenting with back pain.    Per review of EMR, he has been on nortriptyline and gabapentin and follows with survivorship and palliative care. He has reported chronic lower back pain and has been on oxycontin.      Today patient was seen via virtual visit.     He states that his current pain pre-dates his cancer diagnosis. He has most of his pain in his back and hips. He states that he had a fracture in his R foot many years ago and it never healed. He feels that his pain has been present at least 5 years. He feels that his pain has worsened over that time period. He feels he cannot lie or stay still for long periods of time due to pain. He states that his prolonged hospital stays worsened his pain as he was quite immobile. He states his current pain is located in the lower back and will radiate into his legs. He states that the pain will radiate to the posterior knees only, not to the feet. He feels his LLE>RLE pain is worse. He feels his pain is 7/10. He is active with woodworking and feels that prolonged standing worsens his pain. He feels that the oxycontin helps for a few hours. He previously took Tylenol and ibuprofen and wants to reduce his opiate use. He uses Dilaudid sparingly. He feels his pain in his back pain is dull and sharp in his posterior thighs. He feels that coughing and sneezing will worsen his pain. He denies any changes in pain x 6 months.    He does have underlying neuropathy in his fingertips and toes described as tingling. He feels that his symptoms have been improving over time which he is happy about. He feels that gabapentin is helpful for his pain.    He takes a stool softener to prevent constipation in the setting of opiate use. He denies any accidents of bowel or bladder. He has not participated in PT for some time.    He denies other symptoms today.      Cancer History:  Oncology History Overview Note   Referring/Local Oncologist: None    Diagnosis:Ph+ ALL    Genetics:    Karyotype/FISH:Abnormal Karyotype: 46,XY,t(9;22)(q34;q11.2)[1]/45,XY,der(7;9)(q10;q10)t(9;22)(q34;q11.2),der(22)t(9;22)[11]/46,sdl,+der(22)t(9;22)[5]/46,XY[3]     Abnormal FISH: A BCR/ABL1 interphase FISH assay shows an abnormal signal pattern in 97% of the 100 cells scored. 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  The findings support a diagnosis of leukemia and have implications for targeted therapy and for monitoring residual disease.      Molecular Genetics:  BCR-ABL1 p210 transcripts were detected at a level of 46.479 IS% ratio in bone marrow.  BCR-ABL1 p190 transcripts were detected at a level of 4 in 100,000 cells in bone marrow.    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)     01/24/2020 - 04/02/2020 Chemotherapy    IP/OP LEUKEMIA GRAAPH-2005 +  RITUXIMAB < 60 YO  rituximab hypercvad (odd and even course)     02/24/2020 Remission    CR with PCR MRD+; marrow showing 70% blasts with <1% blasts, negative flow MRD, BCR-ABL p210 PCR 0.197%; CNS negative for blasts     03/09/2020 Progression    CSF - rare blast ID'ed - IT chemo given    03/13/20 - IT chemo, CSF negative     04/16/2020 Biopsy    BM Bx with 40-50% cellularity, <1% blasts, MRD flow cytometry negative, BCR-ABL p210 transcripts 0.036%     05/28/2020 - 05/28/2020 Chemotherapy    OP AML - CNS THERAPY (INTRATHECAL CYTARABINE, INTRATHECAL METHOTREXATE, OR INTRATHECAL TRIPLE)  Select one of the following: cytarabine IT 100 mg with hydrocortisone 50 mg, cytarabine IT 40 mg with methotrexate 15 mg with hydrocortisone 50 mg, OR methotrexate IT 12 mg with hydrocortisone 50 mg     06/19/2020 -  Chemotherapy    IP/OP LEUKEMIA BLINATUMOMAB 7-DAY INFUSION (MINIMAL RESIDUAL DISEASE; WT >= 22 KG) (HOME INFUSION)      Cycles 1*-4: Blinatumomab 28 mcg/day Days 1-28 of 6-week cycle.  *Given in the inpatient setting on Days 1-3 on Cycle 1 and Days 1-2 on Cycle 2, while other treatment days are given in the outpatient setting.    Cycle 1 MRD Dosing     07/18/2020 Adverse Reaction    Hospitalization: fevers. Pneumonia     07/23/2020 Biopsy    Diagnosis  Bone marrow, right iliac, aspiration and biopsy  -   Normocellular bone marrow (50%) with trilineage hematopoiesis and 1% blasts by manual aspirate differential  -   Flow cytometry MRD analysis reveals no definitive immunophenotypic evidence of residual B lymphoblastic leukemia   - BCR-ABL p210 0.006%         08/10/2020 -  Chemotherapy    Cycle 2 blinatumomab-ponatinib  Ponatinib 30mg     1 IT per cycle     09/21/2020 Adverse Reaction    Covid-19, symptomatic but not requiring hospitalization.     10/06/2020 -  Chemotherapy    Cycle 3 blinatumomab-ponatinib  Ponatinib 30mg        11/01/2020 Adverse Reaction    Hospitalization: MRSA Pneumonia requiring intubation    Blinatumomab stopped.  Ponatinib held until he stabilized.       12/14/2020 -  Chemotherapy    Cycle 4 blinatumomab 28 mcg/day days 1-28, ponatinib 30 mg per day IT triple therapy day 29     01/13/2021 -  Chemotherapy    Ponatinib 30mg  daily  - due to due low level BCR-ABL     03/06/2021 Adverse Reaction    Hospitalization - ICU on ventilator - influenza and MRSA/klebsiella PNA     ALL (acute lymphoblastic leukemia) (CMS-HCC)   06/11/2020 -  Chemotherapy    IP/OP LEUKEMIA BLINATUMOMAB 7-DAY INFUSION (MINIMAL RESIDUAL DISEASE; WT >= 22 KG) (HOME INFUSION)  Cycles 1*-4: Blinatumomab 28 mcg/day Days 1-28 of 6-week cycle.  *Given in the inpatient setting on Days 1-3 on Cycle 1 and Days 1-2 on Cycle 2, while other treatment days are given in the outpatient setting.     09/21/2020 Initial Diagnosis    ALL (acute lymphoblastic leukemia) (CMS-HCC)     Acute lymphoblastic leukemia in remission (CMS-HCC)   06/11/2020 -  Chemotherapy    IP/OP LEUKEMIA BLINATUMOMAB 7-DAY INFUSION (MINIMAL RESIDUAL DISEASE; WT >= 22 KG) (HOME INFUSION)  Cycles 1*-4: Blinatumomab 28 mcg/day Days 1-28 of 6-week cycle.  *Given in the inpatient setting on Days 1-3 on  Cycle 1 and Days 1-2 on Cycle 2, while other treatment days are given in the outpatient setting.     11/09/2020 Initial Diagnosis    Acute lymphoblastic leukemia in remission (CMS-HCC)         Medical / Surgical History:   Past Medical History:   Diagnosis Date   ??? 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 BONE MARROW BIOPSY & ASPIRATION  01/21/2020        ??? CHG [place], CHEST,REAL TIME  11/20/2020    Procedure: ULTRASOUND, CHEST, REAL TIME WITH IMAGE DOCUMENTATION;  Surgeon: Jerelyn Charles, MD;  Location: [facility]ON[facility] PROCEDURE LAB [facility];  Service: Pulmonary   ??? IR INSERT PORT AGE GREATER THAN 5 YRS  03/10/2020    IR INSERT PORT AGE GREATER THAN 5 YRS 03/10/2020 [name], MD IMG VIR [facility]   ??? PR BRONCHOSCOPY,DIAGNOSTIC W LAVAGE Bilateral 07/20/2020    Procedure: BRONCHOSCOPY, RIGID OR FLEXIBLE, INCLUDE FLUOROSCOPIC GUIDANCE WHEN PERFORMED; W/BRONCHIAL ALVEOLAR LAVAGE WITH MODERATE SEDATION;  Surgeon: Dellis Filbert, MD;  Location: [facility]ON[facility] PROCEDURE LAB [facility];  Service: Pulmonary        Social History:   Social History     Tobacco Use   ??? Smoking status: Every Day     Packs/day: 2.00     Types: Cigarettes     Last attempt to quit: 01/20/2020     Years since quitting: 1.5   ??? Smokeless tobacco: Former     Types: Chew   Vaping Use   ??? Vaping Use: Never used   Substance Use Topics   ??? Alcohol use: Not Currently            Family History: Reviewed and non-contributory to rehab needs  family history is not on file.    Allergies:   Bupropion hcl, Cefepime, Ceftaroline fosamil, Dapsone, Onion, Vancomycin analogues, Bismuth subsalicylate, Privigen [immun glob g(igg)-pro-iga 0-50], and Furosemide    Medications:   Current Outpatient Medications   Medication Instructions   ??? acetaminophen (TYLENOL) 650 mg, Oral, Every 6 hours PRN   ??? albuterol HFA 90 mcg/actuation inhaler 2 puffs, Inhalation, Every 6 hours PRN   ??? amLODIPine (NORVASC) 10 mg, Oral, Daily (standard)   ??? arm brace (WRIST BRACE) Misc 1 Piece, Miscellaneous, As needed (once a day), left ulnar wrist brace   ??? aspirin 81 MG chewable tablet Chew 1 tablet (81 mg total) daily.   ??? carvediloL (COREG) 12.5 mg, Oral, 2 times a day (standard)   ??? chlorhexidine (HIBICLENS) 4 % external liquid Topical, Daily, as directed   ??? clonazePAM (KLONOPIN) 0.5 mg, Oral, 2 times a day PRN   ??? doxycycline (VIBRA-TABS) 100 mg, Oral, 2 times a day (standard)   ??? gabapentin (NEURONTIN) 300 MG capsule Take 2 capsules (600mg ) by mouth in the morning and 3 capsules (900mg ) at bedtime. May cause drowsiness.   ??? HYDROmorphone (DILAUDID) 2 mg, Oral, 2 times a day PRN   ??? mirtazapine (REMERON) 15 MG tablet TAKE 1 TABLET BY MOUTH EVERY DAY AT NIGHT   ??? mucus clearing device Devi 1 each, Miscellaneous, 2 times a day (standard)   ??? mupirocin (BACTROBAN) 2 % ointment Topical, Daily (standard), apply in each nostril prior to bed, as directed   ??? nortriptyline (PAMELOR) 100 mg, Oral, Nightly   ??? OxyCONTIN 15 mg, Oral, 2 times a day (standard)   ??? OxyCONTIN 15  mg, Oral, 2 times a day (standard)   ??? PONATinib (ICLUSIG) 30 mg tablet Take 1 tablet (30 mg total) by mouth daily. Swallow tablets whole. Do not crush, break, cut or chew tablets.   ??? sulfamethoxazole-trimethoprim (BACTRIM DS) 800-160 mg per tablet 160 mg of trimethoprim, Oral, 2 times a day on saturday, sunday, For prophylaxis while on chemo.   ??? triamcinolone (KENALOG) 0.1 % cream Topical, 2 times a day (standard), Apply to back of hands   ??? umeclidinium-vilanteroL (ANORO ELLIPTA) 62.5-25 mcg/actuation inhaler 1 puff, Inhalation, Daily (standard)   ??? valACYclovir (VALTREX) 500 MG tablet Take 1 tablet (500 mg total) by mouth daily.         Review of Systems:    General ROS:   Full 10 systems reviewed and neg, unless noted in HPI  OBJECTIVE:     Vitals:  There were no vitals filed for this visit.      Physical Exam:    GEN: Sitting in bedside chair in NAD.  HEENT: Atraumatic. Normocephalic. Moist mucous membranes.  RESP: NWOB on RA.  CV:  No edema on exposed extremities  SKIN: no rashes or ecchymoses on exposed skin    NEURO:  Mental Status: A&Ox3 attention full, speech fluid and coherent, follows commands   Motor: At least 3/5 for HF, DF, PF  Cerebellar: no abnml or extraneous mvmts  PSYCH: mood euthymic, affect appropriate, thought process logical    Back: No pain with forward flexion or extension. Per patient no pain with palpation of lumbosacral musculature. Mildly positive L slump, not R.     Hips: Mild discomfort     Labs and Diagnostic Studies: Reviewed   CBC -   No results found for requested labs within last 2 days.     BMP -   No results found for requested labs within last 2 days.     Coagulation -   No results found for requested labs within last 2 days.     Cardiac markers -   No results found for requested labs within last 2 days.     LFT's -   No results found for requested labs within last 2 days.       Radiology Results: Personally reviewed CT A+P from 03/25/21 - minimal degenerative changes appreciated. Agree with report as copied below:  Impression   --Mild circumferential wall thickening with adjacent soft tissue stranding involving the distal sigmoid colon/rectum. Findings are nonspecific although can be seen in the setting of early colitis/proctitis.   ??   --Additional chronic and incidental findings as detailed above.   ??   --Please see same day chest CT for findings above the diaphragm.   ??   ====================   ADDENDUM (03/16/2021 4:55 AM):    On review, the following additional findings were noted:   ??   Agree with preliminary report above. In addition to findings suggestive of colitis/proctitis, there is notably liquid stool with air-fluid levels throughout the colon. This can be seen in diarrheal illness.         ASSESSMENT / RECOMMENDATIONS:     Adam Keith is a 41 y.o. male with a past medical history of PH+ B cell ALL s/p chemo presenting with back pain.    Functional Impairment(s) and Rehabilitation Plan of Care    1. Back pain  - Discussed symptoms  - Reviewed EMR; currently on opiates and gabapentin along with nortriptyline  - No change to current pain medications currently  -  No evidence of red flags on discussion today  - Per patient, no change since last images were done (12/22)  - Discussed use of topical NSAID; declined for now  - Discussed utility of core/pelvic girdle strengthening via PT; declined referral for now    Follow up in 4-6 weeks or sooner if needed    _______________________________________________  Corrin Parker, MD  Director of Cancer Rehabilitation  Department of Physical Medicine and Rehabilitation  [facility]    I personally spent >30 minutes face-to-face and non-face-to-face in the care of this patient, which includes all pre, intra, and post visit time on the date of service.        The patient reports they are currently: at home. I spent >30 minutes on the real-time audio and video with the patient on the date of service and on pre- and post-visit activities on the date of service.     The patient was physically located in [place] or a state in which I am permitted to provide care. The patient and/or parent/guardian understood that s/he may incur co-pays and cost sharing, and agreed to the telemedicine visit. The visit was reasonable and appropriate under the circumstances given the patient's presentation at the time.    The patient and/or parent/guardian has been advised of the potential risks and limitations of this mode of treatment (including, but not limited to, the absence of in-person examination) and has agreed to be treated using telemedicine. The patient's/patient's family's questions regarding telemedicine have been answered.     If the visit was completed in an ambulatory setting, the patient and/or parent/guardian has also been advised to contact their provider???s office for worsening conditions, and seek emergency medical treatment and/or call 911 if the patient deems either necessary.

## 2021-08-16 NOTE — Unmapped (Signed)
Has chest pain on right, but now on the left. Worse with driving, better with stretching. Feels like his empyema before, but no fevers/chills/cough. Will check CXR locally and see if things easy off with conservative management. If worsening, or signs of infx, will order CT chest non-con for f/u.     Burman Riis Jack Bolio MD  Pulmonary and Critical Care Fellow  PGY-6  08/16/21

## 2021-08-16 NOTE — Unmapped (Signed)
Completed med. Review. Gave patient instructions for video visit in my chart.

## 2021-08-16 NOTE — Unmapped (Signed)
CXR order faxed to [facility]  [address] [place], [place] [address]   Main [phone] Fax: [phone]. Confirmation of receipt

## 2021-08-19 MED FILL — ICLUSIG 30 MG TABLET: ORAL | 30 days supply | Qty: 30 | Fill #2

## 2021-08-20 LAB — HLA CL I&II, HIGH RES

## 2021-08-23 NOTE — Unmapped (Signed)
Called Patient phone number to follow up on cold symptoms expressed over the weekend. Left voice mail with patient. Encouraged patient to send a my chart message with an update on his symptoms

## 2021-08-27 ENCOUNTER — Ambulatory Visit: Admit: 2021-08-27 | Discharge: 2021-08-28 | Payer: MEDICAID

## 2021-08-31 ENCOUNTER — Other Ambulatory Visit: Admit: 2021-08-31 | Discharge: 2021-08-31 | Payer: MEDICAID

## 2021-08-31 ENCOUNTER — Ambulatory Visit: Admit: 2021-08-31 | Discharge: 2021-08-31 | Payer: MEDICAID | Attending: Adult Health | Primary: Adult Health

## 2021-08-31 ENCOUNTER — Ambulatory Visit: Admit: 2021-08-31 | Discharge: 2021-08-31 | Payer: MEDICAID

## 2021-08-31 ENCOUNTER — Encounter: Admit: 2021-08-31 | Discharge: 2021-08-31 | Payer: MEDICAID | Attending: Adult Health | Primary: Adult Health

## 2021-08-31 DIAGNOSIS — C9100 Acute lymphoblastic leukemia not having achieved remission: Principal | ICD-10-CM

## 2021-08-31 LAB — COMPREHENSIVE METABOLIC PANEL
ALBUMIN: 4.3 g/dL (ref 3.4–5.0)
ALKALINE PHOSPHATASE: 86 U/L (ref 46–116)
ALT (SGPT): 43 U/L (ref 10–49)
ANION GAP: 8 mmol/L (ref 5–14)
AST (SGOT): 29 U/L (ref <=34)
BILIRUBIN TOTAL: 0.3 mg/dL (ref 0.3–1.2)
BLOOD UREA NITROGEN: 20 mg/dL (ref 9–23)
BUN / CREAT RATIO: 10
CALCIUM: 8.9 mg/dL (ref 8.7–10.4)
CHLORIDE: 107 mmol/L (ref 98–107)
CO2: 27 mmol/L (ref 20.0–31.0)
CREATININE: 1.91 mg/dL — ABNORMAL HIGH
EGFR CKD-EPI (2021) MALE: 44 mL/min/{1.73_m2} — ABNORMAL LOW (ref >=60)
GLUCOSE RANDOM: 107 mg/dL (ref 70–179)
POTASSIUM: 4.7 mmol/L (ref 3.4–4.8)
PROTEIN TOTAL: 6.6 g/dL (ref 5.7–8.2)
SODIUM: 142 mmol/L (ref 135–145)

## 2021-08-31 LAB — CBC W/ AUTO DIFF
BASOPHILS ABSOLUTE COUNT: 0.2 10*9/L — ABNORMAL HIGH (ref 0.0–0.1)
BASOPHILS RELATIVE PERCENT: 1.4 %
EOSINOPHILS ABSOLUTE COUNT: 0.5 10*9/L (ref 0.0–0.5)
EOSINOPHILS RELATIVE PERCENT: 4.2 %
HEMATOCRIT: 46.5 % (ref 39.0–48.0)
HEMOGLOBIN: 16.2 g/dL (ref 12.9–16.5)
LYMPHOCYTES ABSOLUTE COUNT: 2.5 10*9/L (ref 1.1–3.6)
LYMPHOCYTES RELATIVE PERCENT: 22.6 %
MEAN CORPUSCULAR HEMOGLOBIN CONC: 35 g/dL (ref 32.0–36.0)
MEAN CORPUSCULAR HEMOGLOBIN: 29.6 pg (ref 25.9–32.4)
MEAN CORPUSCULAR VOLUME: 84.7 fL (ref 77.6–95.7)
MEAN PLATELET VOLUME: 8.6 fL (ref 6.8–10.7)
MONOCYTES ABSOLUTE COUNT: 0.9 10*9/L — ABNORMAL HIGH (ref 0.3–0.8)
MONOCYTES RELATIVE PERCENT: 8.1 %
NEUTROPHILS ABSOLUTE COUNT: 7.2 10*9/L (ref 1.8–7.8)
NEUTROPHILS RELATIVE PERCENT: 63.7 %
PLATELET COUNT: 194 10*9/L (ref 150–450)
RED BLOOD CELL COUNT: 5.49 10*12/L (ref 4.26–5.60)
RED CELL DISTRIBUTION WIDTH: 15.8 % — ABNORMAL HIGH (ref 12.2–15.2)
WBC ADJUSTED: 11.3 10*9/L — ABNORMAL HIGH (ref 3.6–11.2)

## 2021-08-31 MED ORDER — SULFAMETHOXAZOLE 800 MG-TRIMETHOPRIM 160 MG TABLET
ORAL_TABLET | 3 refills | 0 days | Status: CP
Start: 2021-08-31 — End: ?

## 2021-08-31 NOTE — Unmapped (Signed)
Pt declined PIV placement at Lab appt.  Labs drawn via butterfly & sent for analysis.  To next appt.  Care provided by  Shelly Wood RN.

## 2021-08-31 NOTE — Unmapped (Signed)
Please refill if appropriate.    Most recent clinic visit: 08/31/2021    Next clinic visit: 09/08/2021

## 2021-09-01 NOTE — Unmapped (Signed)
See Mychart message about managing the congestion.    Continue the ponatinib and aspirin daily.    I will update you with the BCR/ABL level once it is available.    Please return in 3 months for a follow up visit and labs.    Please call us if you experience:    1. Fever of 100.5 F or higher, shaking chills, drenching night sweats.  2. Any rapidly enlarging lymph node or mass  3. Unintentional weight loss  4. Any other concerning symptom     MyChart Messages  For your safety and best care, please DO NOT use MyChart messages to report symptoms. (Symptoms should be reported by calling the nurse triage line). Please use MyChart for non-urgent matters such as general questions, non-urgent prescription refills, or non-urgent scheduling issues.     ?? Please do not use MyChart for URGENT messages, as messages are only checked during regular business hours.     ?? Please note that MyChart messages may be routed a central pool and one of your provider???s team members will get back to you.  - Expect up to 3 business days for response     If you have any other questions, please do not hesitate to contact us.    Nurse Navigator: Covering hematologic malignancies navigator  Nurse Practitioner: [name]    For health related questions Monday through Friday 8 AM-- 5 PM : please call the office at [phone] and ask to speak with a nurse.  For appointment changes call: Main Clinic [phone].  Toll free number is [phone].    On Nights, Weekends and Holidays:  Call [phone] and ask for the adult hematologist/oncologist on call.      [facility]  [address]  [place], [place] [address]  [email or web address]    Results for orders placed or performed in visit on 08/31/21   Comprehensive Metabolic Panel   Result Value Ref Range    Sodium 142 135 - 145 mmol/L    Potassium 4.7 3.4 - 4.8 mmol/L    Chloride 107 98 - 107 mmol/L    CO2 27.0 20.0 - 31.0 mmol/L    Anion Gap 8 5 - 14 mmol/L    BUN 20 9 - 23 mg/dL Creatinine 2.44 (H) 0.10 - 1.10 mg/dL    BUN/Creatinine Ratio 10     eGFR CKD-EPI (2021) Male 44 (L) >=60 mL/min/1.35m2    Glucose 107 70 - 179 mg/dL    Calcium 8.9 8.7 - 27.2 mg/dL    Albumin 4.3 3.4 - 5.0 g/dL    Total Protein 6.6 5.7 - 8.2 g/dL    Total Bilirubin 0.3 0.3 - 1.2 mg/dL    AST 29 <=53 U/L    ALT 43 10 - 49 U/L    Alkaline Phosphatase 86 46 - 116 U/L   Type and Screen   Result Value Ref Range    Blood Type O POS     Select Screen 3 NEG    CBC w/ Differential   Result Value Ref Range    WBC 11.3 (H) 3.6 - 11.2 10*9/L    RBC 5.49 4.26 - 5.60 10*12/L    HGB 16.2 12.9 - 16.5 g/dL    HCT 66.4 40.3 - 47.4 %    MCV 84.7 77.6 - 95.7 fL    MCH 29.6 25.9 - 32.4 pg    MCHC 35.0 32.0 - 36.0 g/dL    RDW 25.9 (H) 56.3 - 15.2 %  MPV 8.6 6.8 - 10.7 fL    Platelet 194 150 - 450 10*9/L    Neutrophils % 63.7 %    Lymphocytes % 22.6 %    Monocytes % 8.1 %    Eosinophils % 4.2 %    Basophils % 1.4 %    Absolute Neutrophils 7.2 1.8 - 7.8 10*9/L    Absolute Lymphocytes 2.5 1.1 - 3.6 10*9/L    Absolute Monocytes 0.9 (H) 0.3 - 0.8 10*9/L    Absolute Eosinophils 0.5 0.0 - 0.5 10*9/L    Absolute Basophils 0.2 (H) 0.0 - 0.1 10*9/L   BCR/ABL1 p210 Blood   Result Value Ref Range    Collection Collected      *Note: Due to a large number of results and/or encounters for the requested time period, some results have not been displayed. A complete set of results can be found in Results Review.

## 2021-09-01 NOTE — Unmapped (Signed)
[facility]    Patient Name: Adam Keith  Patient Age: 41 y.o.  Encounter Date: 08/31/2021    Primary Care Provider:  Mick Sell, MD    Referring Physician:  Unknown Per Patient Referring  No address on file    Reason for visit:  Ph+ B-ALL follow up     Assessment:  A 41 y.o. 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 The course was complicated by septic shock, candida krusei fungemia, MRSA bacteremia with septic emboli c/b acute renal failure and respiratory distress requiring dialysis and intubation.  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  He subsequently had difficulty tolerating single agent dasatinib (as a bridge to planned ponatinib-blinatumomab--had fluid retention and pleural effusion).  He then completed  4 cycles of ponatinib-blinatumomab, initiated for treatment of MRD in the setting of poor tolerance of standard therapy.  Most recent BCR-ABL level of 0.002% on marrow (01/2021) and undetectable in the blood (06/2021).     Patient is currently on ponatinib 30 mg monotherapy (since 01/2021).     He is overall stable. Aside from lingering nasal congestion s/p a cold he is slightly improved in energy/symptoms from 3 month ago. He remains quite torn about proceeding to transplant at [facility] time time and is planning to discuss with Dr. [name] to learn her perspective about the risks of infectious complications.  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   - Plan for another attempt IVIG for now split over 2 days - timing to be determined by Drs. Patel and Buhlinger      DRESS: followed by Dr. 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 Now s/p dialysis  - stable Cr appears to have developed CKD due to incompletely healed acute injury.  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 [name] and Dr. Verlan Friends and Dr. Maple Mirza following patient.    - long term doxycycline with goal of decolonization  **Hx Candidemia during induction - now s/p Cresemab  - followed by Dr. [name]  **Hx Covid-19   - symptoms now resolved    Symptom management: bowel regimen as listed in medication list, analgesics as listed in medication list or symptoms reviewed and due to acceptable control, no changes made  **Peripheral neuropathy, sensory and motor  - Grade 1-2 monitor  **Chronic Back Pain, complicated by hospital stay and procedures  - Followed by Dr. Genice Rouge, Palliative Care  **Depression/anxiety: met with [name], [facility]  - nortriptyline  -  PRN clonazepam  - has not been using PRN hydroxyzine    Venous access: I recommend the following change for venous access : PICC line removal      Supportive Care Recommendations:  We recommend based on the patient???s underlying diagnosis and treatment history the following supportive care:      1. Antimicrobial prophylaxis:    - Viral - valacyclovir 500mg  daily  - Bacterial - levofloxacin 500 mg if ANC <0.5  - Fungal - on treatment isavuconazole  - PJP - Bactrim DS BID Sat/Sun  - recommend Annual flu shot - declines thus far  - recommend updated Covid vaccine - declines    2. Blood product support:  I recommend the following intervals for laboratory monitoring to determine transfusion needs and monitor for hematologic effects of therapy and underlying disease: no routine monitoring outside of clinic follow-up    Leukoreduced blood products are required.  Irradiated blood products are preferred, but in case of urgent transfusion needs non-irradiated blood products may be used:     -  RBC transfusion threshold: transfuse 2 units for Hgb < 8 g/dL.  -  Platelet transfusion threshold: transfuse 1 unit of platelets for platelet count < 10, or for bleeding or need for invasive procedure.    3. Hematopoietic growth factor support: none      Care coordination: The next [facility] Follow up requested:  - 3 month followup      These services include the following elements of medical decision making:  addressing a hematologic cancer that poses a threat to life and/or bone marrow function  interpretation of diagnostic test reports or ordering of diagnostic tests and independent interpretation of diagnostic tests  High risk of morbidity due to drug therapy requiring monitoring for toxicity or decisions regarding goals and continuation of antineoplastic therapy      I personally spent 40 minutes face-to-face and non-face-to-face in the care of this patient, which includes all pre, intra, and post visit time on the date of service.  All documented time was specific to the E/M visit and does not include any procedures that may have been performed.    [name], AGPCNP-BC  Nurse Practitioner  Hematology/Oncology  [facility]    Mariel Aloe, MD was available  Leukemia Program  Division of Hematology  [facility]      Nurse Navigator (non-clinical trial patients): Elicia Lamp, RN        Tel. [phone]       Fax. [phone]  Toll-free appointments: [phone]  Scheduling assistance: [phone]  After hours/weekends: [phone] (ask for adult hematology/oncology on-call)            History of Present Illness:  We had the pleasure of seeing Adam Keith in the Leukemia Clinic at the [place] [facility] [place] on 08/31/2021.  He is a 41 y.o. male with  Ph+ B-ALL .          His oncologic history is as follows:      Oncology History Overview Note   Referring/Local Oncologist: None    Diagnosis:Ph+ ALL    Genetics:    Karyotype/FISH:Abnormal Karyotype: 46,XY,t(9;22)(q34;q11.2)[1]/45,XY,der(7;9)(q10;q10)t(9;22)(q34;q11.2),der(22)t(9;22)[11]/46,sdl,+der(22)t(9;22)[5]/46,XY[3]     Abnormal FISH: A BCR/ABL1 interphase FISH assay shows an abnormal signal pattern in 97% of the 100 cells scored. 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  The findings support a diagnosis  of leukemia and have implications for targeted therapy and for monitoring residual disease.      Molecular Genetics:  BCR-ABL1 p210 transcripts were detected at a level of 46.479 IS% ratio in bone marrow.  BCR-ABL1 p190 transcripts were detected at a level of 4 in 100,000 cells in bone marrow.    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)       01/24/2020 - 04/02/2020 Chemotherapy    IP/OP LEUKEMIA GRAAPH-2005 + RITUXIMAB < 60 YO  rituximab hypercvad (odd and even course)       02/24/2020 Remission    CR with PCR MRD+; marrow showing 70% blasts with <1% blasts, negative flow MRD, BCR-ABL p210 PCR 0.197%; CNS negative for blasts     03/09/2020 Progression    CSF - rare blast ID'ed - IT chemo given    03/13/20 - IT chemo, CSF negative     04/16/2020 Biopsy    BM Bx with 40-50% cellularity, <1% blasts, MRD flow cytometry negative, BCR-ABL p210 transcripts 0.036%     05/28/2020 - 05/28/2020 Chemotherapy    OP AML - CNS THERAPY (INTRATHECAL CYTARABINE, INTRATHECAL METHOTREXATE, OR INTRATHECAL TRIPLE)  Select one of the following: cytarabine IT 100 mg with hydrocortisone 50 mg, cytarabine IT 40 mg with methotrexate 15 mg with hydrocortisone 50 mg, OR methotrexate IT 12 mg with hydrocortisone 50 mg       06/19/2020 -  Chemotherapy    IP/OP LEUKEMIA BLINATUMOMAB 7-DAY INFUSION (MINIMAL RESIDUAL DISEASE; WT >= 22 KG) (HOME INFUSION)      Cycles 1*-4: Blinatumomab 28 mcg/day Days 1-28 of 6-week cycle.  *Given in the inpatient setting on Days 1-3 on Cycle 1 and Days 1-2 on Cycle 2, while other treatment days are given in the outpatient setting.    Cycle 1 MRD Dosing     07/18/2020 Adverse Reaction    Hospitalization: fevers. Pneumonia     07/23/2020 Biopsy    Diagnosis  Bone marrow, right iliac, aspiration and biopsy  -   Normocellular bone marrow (50%) with trilineage hematopoiesis and 1% blasts by manual aspirate differential  -   Flow cytometry MRD analysis reveals no definitive immunophenotypic evidence of residual B lymphoblastic leukemia   - BCR-ABL p210 0.006%         08/10/2020 -  Chemotherapy    Cycle 2 blinatumomab-ponatinib  Ponatinib 30mg     1 IT per cycle     09/21/2020 Adverse Reaction    Covid-19, symptomatic but not requiring hospitalization.     10/06/2020 -  Chemotherapy    Cycle 3 blinatumomab-ponatinib  Ponatinib 30mg        11/01/2020 Adverse Reaction    Hospitalization: MRSA Pneumonia requiring intubation    Blinatumomab stopped.  Ponatinib held until he stabilized.       12/14/2020 -  Chemotherapy    Cycle 4 blinatumomab 28 mcg/day days 1-28, ponatinib 30 mg per day IT triple therapy day 29     01/13/2021 -  Chemotherapy    Ponatinib 30mg  daily  - due to due low level BCR-ABL     03/06/2021 Adverse Reaction    Hospitalization - ICU on ventilator - influenza and MRSA/klebsiella PNA     ALL (acute lymphoblastic leukemia) (CMS-HCC)   06/11/2020 -  Chemotherapy    IP/OP  LEUKEMIA BLINATUMOMAB 7-DAY INFUSION (MINIMAL RESIDUAL DISEASE; WT >= 22 KG) (HOME INFUSION)  Cycles 1*-4: Blinatumomab 28 mcg/day Days 1-28 of 6-week cycle.  *Given in the inpatient setting on Days 1-3 on Cycle 1 and Days 1-2 on Cycle 2, while other treatment days are given in the outpatient setting.       09/21/2020 Initial Diagnosis    ALL (acute lymphoblastic leukemia) (CMS-HCC)       Acute lymphoblastic leukemia in remission (CMS-HCC)   06/11/2020 -  Chemotherapy    IP/OP LEUKEMIA BLINATUMOMAB 7-DAY INFUSION (MINIMAL RESIDUAL DISEASE; WT >= 22 KG) (HOME INFUSION)  Cycles 1*-4: Blinatumomab 28 mcg/day Days 1-28 of 6-week cycle.  *Given in the inpatient setting on Days 1-3 on Cycle 1 and Days 1-2 on Cycle 2, while other treatment days are given in the outpatient setting.       11/09/2020 Initial Diagnosis    Acute lymphoblastic leukemia in remission (CMS-HCC)           Interim History:  Overall doing ok, slightly better than 2 month ago.  He had a cold a month ago and it did not progress to a severe infection. He still has some lingering nasal congestion.  Persistent sun-sensitivity  Energy/stamina are limited.  Breathing fluctuates- overall slightly improved.  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  Financial challenges from not working.  Misses work.  Stressed about the BMT decision.      Past Medical, Surgical and Family History were reviewed and pertinent updates were made in the Electronic Medical Record      ECOG Performance Status: 1    Medications:      Current Outpatient Medications   Medication Sig Dispense Refill    acetaminophen (TYLENOL) 325 MG tablet Take 2 tablets (650 mg total) by mouth every six (6) hours as needed for pain.      albuterol HFA 90 mcg/actuation inhaler Inhale 2 puffs every six (6) hours as needed for wheezing. 8 g 11    amLODIPine (NORVASC) 10 MG tablet Take 1 tablet (10 mg total) by mouth daily. 30 tablet 0    arm brace (WRIST BRACE) Misc 1 Piece by Miscellaneous route as needed. left ulnar wrist brace 1 each 0    aspirin 81 MG chewable tablet Chew 1 tablet (81 mg total) daily. 36 tablet 11    carvediloL (COREG) 12.5 MG tablet Take 1 tablet (12.5 mg total) by mouth Two (2) times a day. 180 tablet 1    chlorhexidine (HIBICLENS) 4 % external liquid Apply topically daily. as directed 946 mL 0    clonazePAM (KLONOPIN) 0.5 MG tablet Take 1 tablet (0.5 mg total) by mouth two (2) times a day as needed for anxiety or sleep. 60 tablet 1    doxycycline (VIBRA-TABS) 100 MG tablet Take 1 tablet (100 mg total) by mouth Two (2) times a day. 180 tablet 3    gabapentin (NEURONTIN) 300 MG capsule Take 2 capsules (600mg ) by mouth in the morning and 3 capsules (900mg ) at bedtime. May cause drowsiness. 150 capsule 5    HYDROmorphone (DILAUDID) 2 MG tablet Take 1 tablet (2 mg total) by mouth two (2) times a day as needed. 56 tablet 0    mirtazapine (REMERON) 15 MG tablet TAKE 1 TABLET BY MOUTH EVERY DAY AT NIGHT 90 tablet 4    mucus clearing device Devi 1 each by Miscellaneous route Two (2) times a day. 1 each 0    mupirocin (  BACTROBAN) 2 % ointment Apply topically daily. apply in each nostril prior to bed, as directed 15 g 0    nortriptyline (PAMELOR) 50 MG capsule Take 2 capsules (100 mg total) by mouth nightly.      OXYCONTIN 15 mg 12 hr crush resistant ER/CR tablet Take 1 tablet (15 mg total) by mouth Two (2) times a day. 56 tablet 0    OXYCONTIN 15 mg 12 hr crush resistant ER/CR tablet Take 1 tablet (15 mg total) by mouth Two (2) times a day. 56 tablet 0    PONATinib (ICLUSIG) 30 mg tablet Take 1 tablet (30 mg total) by mouth daily. Swallow tablets whole. Do not crush, break, cut or chew tablets. 30 tablet 5    umeclidinium-vilanteroL (ANORO ELLIPTA) 62.5-25 mcg/actuation inhaler Inhale 1 puff daily. 60 each 11    valACYclovir (VALTREX) 500 MG tablet Take 1 tablet (500 mg total) by mouth daily. 90 tablet 11 sulfamethoxazole-trimethoprim (BACTRIM DS) 800-160 mg per tablet TAKE 1 TABLET BY MOUTH 2 TIMES A DAY ON SATURDAY, SUNDAY. FOR PROPHYLAXIS WHILE ON CHEMO. 48 tablet 3     No current facility-administered medications for this visit.       Vital Signs:  Vitals:    08/31/21 0819   BP: (S) 151/93   Pulse: 82   Resp: 18   Temp: 37.1 ??C (98.7 ??F)   SpO2: 97%       Relevant Physical Exam Findings:  General: Resting in no apparent distress, accompanied by spouse  HEENT:  Clear sclera, conjunctiva, mask in place  CARDAC: WWP  RESP: nonlabored  GI: non-distended  NEURO: alert, and oriented x4, steady gait, no focal deficits  PSYCH: appropriate  DERM: no visible rashes, lesions        Relevant Laboratory, radiology and pathology results:  I personally viewed the most recent internal records, laboratory results and hematopathology report  and discussed the available results with the patient and family  A summary of results follows:  Lab on 08/31/2021   Component Date Value Ref Range Status    Collection 08/31/2021 Collected   Final    Sodium 08/31/2021 142  135 - 145 mmol/L Final    Potassium 08/31/2021 4.7  3.4 - 4.8 mmol/L Final    Chloride 08/31/2021 107  98 - 107 mmol/L Final    CO2 08/31/2021 27.0  20.0 - 31.0 mmol/L Final    Anion Gap 08/31/2021 8  5 - 14 mmol/L Final    BUN 08/31/2021 20  9 - 23 mg/dL Final    Creatinine 56/21/3086 1.91 (H)  0.60 - 1.10 mg/dL Final    BUN/Creatinine Ratio 08/31/2021 10   Final    eGFR CKD-EPI (2021) Male 08/31/2021 44 (L)  >=60 mL/min/1.80m2 Final    eGFR calculated with CKD-EPI 2021 equation in accordance with [employer] and [employer] of Nephrology Task Force recommendations.    Glucose 08/31/2021 107  70 - 179 mg/dL Final    Calcium 57/84/6962 8.9  8.7 - 10.4 mg/dL Final    Albumin 95/28/4132 4.3  3.4 - 5.0 g/dL Final    Total Protein 08/31/2021 6.6  5.7 - 8.2 g/dL Final    Total Bilirubin 08/31/2021 0.3  0.3 - 1.2 mg/dL Final    AST 44/04/270 29  <=34 U/L Final ALT 08/31/2021 43  10 - 49 U/L Final    Alkaline Phosphatase 08/31/2021 86  46 - 116 U/L Final    WBC 08/31/2021 11.3 (H)  3.6 - 11.2 10*9/L  Final    RBC 08/31/2021 5.49  4.26 - 5.60 10*12/L Final    HGB 08/31/2021 16.2  12.9 - 16.5 g/dL Final    HCT 16/01/9603 46.5  39.0 - 48.0 % Final    MCV 08/31/2021 84.7  77.6 - 95.7 fL Final    MCH 08/31/2021 29.6  25.9 - 32.4 pg Final    MCHC 08/31/2021 35.0  32.0 - 36.0 g/dL Final    RDW 54/12/8117 15.8 (H)  12.2 - 15.2 % Final    MPV 08/31/2021 8.6  6.8 - 10.7 fL Final    Platelet 08/31/2021 194  150 - 450 10*9/L Final    Neutrophils % 08/31/2021 63.7  % Final    Lymphocytes % 08/31/2021 22.6  % Final    Monocytes % 08/31/2021 8.1  % Final    Eosinophils % 08/31/2021 4.2  % Final    Basophils % 08/31/2021 1.4  % Final    Absolute Neutrophils 08/31/2021 7.2  1.8 - 7.8 10*9/L Final    Absolute Lymphocytes 08/31/2021 2.5  1.1 - 3.6 10*9/L Final    Absolute Monocytes 08/31/2021 0.9 (H)  0.3 - 0.8 10*9/L Final    Absolute Eosinophils 08/31/2021 0.5  0.0 - 0.5 10*9/L Final    Absolute Basophils 08/31/2021 0.2 (H)  0.0 - 0.1 10*9/L Final    Blood Type 08/31/2021 O POS   Final    Select Screen 3 08/31/2021 NEG   Final    Patient has a history of red cell antibodies     Chest X-ray PA and lateral  Increased interstitial edema versus atypical infection/inflammation.     Small right pleural effusion.    Diagnosis   Date Value Ref Range Status   03/16/2021   Final    A. Lung, right upper lobe, bronchoalveolar lavage:  - No cytologically malignant cells identified  - Abundant neutrophils present    This electronic signature is attestation that the pathologist personally reviewed the submitted material(s) and the final diagnosis reflects that evaluation.         Diagnosis   Date Value Ref Range Status   03/16/2021   Final    A. Lung, right upper lobe, bronchoalveolar lavage:  - No cytologically malignant cells identified  - Abundant neutrophils present    This electronic signature is attestation that the pathologist personally reviewed the submitted material(s) and the final diagnosis reflects that evaluation.       01/11/2021   Final    A:  Cerebrospinal fluid, cytospin review and flow cytometry   -  No blasts identified by morphology or flow cytometric analysis    This electronic signature is attestation that the pathologist personally reviewed the submitted material(s) and the final diagnosis reflects that evaluation.       01/11/2021   Final    Bone marrow, left iliac, aspiration and biopsy  -  Normocellular bone marrow (30% overall) with trilineage hematopoiesis and less than 1% blasts by manual aspirate differential  -   Flow cytometry MRD analysis reveals no definitive immunophenotypic evidence of residual B lymphoblastic leukemia (see Comment)     -  See linked reports for associated Ancillary Studies.      This electronic signature is attestation that the pathologist personally reviewed the submitted material(s) and the final diagnosis reflects that evaluation.

## 2021-09-08 ENCOUNTER — Encounter: Admit: 2021-09-08 | Discharge: 2021-09-08 | Payer: MEDICAID | Attending: Internal Medicine | Primary: Internal Medicine

## 2021-09-08 ENCOUNTER — Encounter: Admit: 2021-09-08 | Discharge: 2021-09-09 | Payer: MEDICAID | Attending: Internal Medicine | Primary: Internal Medicine

## 2021-09-08 ENCOUNTER — Telehealth
Admit: 2021-09-08 | Discharge: 2021-09-09 | Payer: MEDICAID | Attending: Student in an Organized Health Care Education/Training Program | Primary: Student in an Organized Health Care Education/Training Program

## 2021-09-08 DIAGNOSIS — Z515 Encounter for palliative care: Principal | ICD-10-CM

## 2021-09-08 NOTE — Unmapped (Signed)
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 He was recently admitted to [facility] with with complicated PNA requiring intubation.  Has had episodic fluctuations in mood but currently doing fairly well.  Has big upcoming decision about whether or not to pursue bone marrow transplant which we will continue to help him with.    Assessment/Plan:     # Depressed Mood: Continues to improve as he has been more active.  Still intermittent periods of depression but feels like he is doing better. His wife is his primary support, and he also follows with psych and a therapist.    -Medications per [name]      #Pain: Right lower extremity pain resolved.  Now predominantly has chronic back pain.  Very motivated to get off of opioids given his goal is to return to work and he worries that this is not allowed for his job.  Discussed possibility of titrating off of opioids over time versus switching to partial opioid agonist such as buprenorphine, consider Butrans patch.  He is considering whether or not it is worth undergoing a bone marrow transplant.  If he does not undergo transplant he states he would like to start titrating off of his opioids.  -Continue gabapentin to 600mg  qAM and 900mg  qbedtime  -No longer using Dilaudid dilaudid 2mg  PO BID PRN       - PDMP reviewed  - Continue oxycontin 15mg  PO BID       - PDMP reviewed, refills sent for June and July (7/12)  - Referral to cancer rehab, so, but was not interested in following up with rehab or physical therapy    # LUE pain: Improved.   - Pain management as above    #Chronic pain: At baseline, see above  - Pain management as above    #Advance care planning: Reviewed again today the difficult decision of deciding whether or not to pursue bone marrow transplant or not.  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  He will have to stop woodworking for at least a year if he pursues transplant.  Reviewed that the benefits of transplant is that it would hopefully give him significantly more time where he could be functional, productive and active, although the timeline would be delayed.  After discussion recommended he can pursue transplant, but he is still considering.  Told him I am available for further discussions if helpful.  He is waiting for infectious disease to weigh in about risk of future infections if he pursues transplant.  We will discuss again on 7/19 after he meets with the infectious disease doctor.  Message sent to BMT, malignant heme, and infectious disease to try and coordinate care and facilitate decision-making.    #Controlled substances risk management:  ??? Patient does not have a signed pain medication agreement with our team.  ??? [place] database was reviewed today and it was??appropriate.  ??? Urine drug screen was not??performed at this visit. Findings: not applicable.  ??? Patient has received information about safe storage and administration of medications.  ??? Patient has not??received a prescription for narcan; is not applicable.     F/u: Fu on 7/19  ----------------------------------------  Referring Provider:??Dr. Berline Lopes  Oncology Team: Malignant hematology team  PCP:??[name], MD    HPI: Adam Keith with a  diagnosis of B-ALL, diagnosed in October 2021, now in complete remission.??Recently admitted to the [facility] ICU 03/06/21-03/20/21 for AHRF d/t MRSA/klebsiella PNA, requiring intubation. This is following admissions in August, May, April, March, February, and January 2022.    Interval History:   COntinues to be anxious and uncertain about transplant. Worried about infections, has fu with ID scheduled on 7/18. Recently saw PMR and declined referral to PT to work on core strength. Also saw [name] and no change to medications. Overall feels like everything is about the same. Hasn't had any issues or complications lately. Feels like his mood has been much better since he's been able to me more active. Fewer times where his mind is racing. Still has his days but feels like they are much less frequent. Takes his Oxyconitn at 8am and notices that he starts to have worse pain around 4-5p. Not taking the Dilaudid at all.      His wife keeps worrying about him.       Palliative Performance Scale: 90% - Ambulation: Full / Normal Activity, some evidence of disease / Self-Care:Full / Intake: Normal / Level of Conscious: Full    Coping/Support Issues: Having a lot of difficulty coping with his current setback. Well supported by his wife. Also has a psychiatrist and therapist.  ??  Goals of Care: Treatment and cancer-directed therapy  ??  Social History:  Name of primary support: [name]  Occupation: Was in the [employer]	[place]  Hobbies: Training and development officer  Current residence / distance from [facility]: [place]    Has a 60 year old son.   ??  Advance Care Planning: Did not discuss this visit.    Objective     Allergies:   Allergies   Allergen Reactions   ??? Bupropion Hcl Other (See Comments)     Per patient out of touch with reality, suicidal, homicidal   ??? Cefepime Rash     DRESS   ??? Ceftaroline Fosamil Rash and Other (See Comments)     Rash X 2 02/2020, suspected DRESS 06/2020   ??? 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 Onion Anaphylaxis   ??? Vancomycin Analogues      Hearing loss with Lasix  Other reaction(s): Other (See Comments)  Hearing loss  lasix   ??? Bismuth Subsalicylate Nausea And Vomiting   ??? Privigen [Immun Glob G(Igg)-Pro-Iga 0-50] Other (See Comments)     03/10/2021 VIG infusion stopped as patient developed rigors, tachycardia and HTN during infusion despite pre-medication; 03/12/21 completed IVIG with premedications and slower rate of infusion   ??? Furosemide      With Vancomycin caused hearing loss  Other reaction(s): Other (See Comments)  With Vancomycin caused hearing loss       Family History:  Cancer-related family history is negative for Melanoma.  He indicated that the status of his neg hx is unknown.      REVIEW OF SYSTEMS:  A comprehensive review of 10 systems was negative except for pertinent positives noted in HPI.    Lab Results   Component Value Date    CREATININE 1.91 (H) 08/31/2021     Lab Results   Component Value Date    ALKPHOS 86 08/31/2021    BILITOT 0.3 08/31/2021    BILIDIR 0.30 03/20/2021    PROT 6.6 08/31/2021    ALBUMIN 4.3 08/31/2021    ALT 43 08/31/2021    AST 29 08/31/2021     Physical  exam:  General: Overall well-appearing Keith in no acute distress, wearing sunglasses, standing in wood shop  Pulmonary: No increased work of breathing  Neurological: Normal gait, no acute deficits  Psychiatric: Mood I am feeling a lot better affect appropriate and full range         The patient reports they are currently: at home. I spent 20 minutes on the real-time audio and video with the patient on the date of service. I spent an additional 8 minutes on pre- and post-visit activities on the date of service.     The patient was physically located in [place] or a state in which I am permitted to provide care. The patient and/or parent/guardian understood that s/he may incur co-pays and cost sharing, and agreed to the telemedicine visit. The visit was reasonable and appropriate under the circumstances given the patient's presentation at the time.    The patient and/or parent/guardian has been advised of the potential risks and limitations of this mode of treatment (including, but not limited to, the absence of in-person examination) and has agreed to be treated using telemedicine. The patient's/patient's family's questions regarding telemedicine have been answered.     If the visit was completed in an ambulatory setting, the patient and/or parent/guardian has also been advised to contact their provider???s office for worsening conditions, and seek emergency medical treatment and/or call 911 if the patient deems either necessary.    Kathlynn Grate, MD  [facility] Outpatient Oncology Palliative Care

## 2021-09-08 NOTE — Unmapped (Signed)
[facility] Refill Coordination Note    Specialty Medication(s) to be Shipped:   Hematology/Oncology: Iclusig    Other medication(s) to be shipped: No additional medications requested for fill at this time     Adam Keith, DOB: Aug 15, 1980  Phone: [phone] (home)       All above HIPAA information was verified with patient.     Was a Nurse, learning disability used for this call? No    Completed refill call assessment today to schedule patient's medication shipment from the [facility] [phone]).  All relevant notes have been reviewed.     Specialty medication(s) and dose(s) confirmed: Regimen is correct and unchanged.   Changes to medications: Mitesh reports no changes at this time.  Changes to insurance: No  New side effects reported not previously addressed with a pharmacist or physician: None reported  Questions for the pharmacist: No    Confirmed patient received a Conservation officer, historic buildings and a Surveyor, mining with first shipment. The patient will receive a drug information handout for each medication shipped and additional FDA Medication Guides as required.       DISEASE/MEDICATION-SPECIFIC INFORMATION        N/A    SPECIALTY MEDICATION ADHERENCE     Medication Adherence    Patient reported X missed doses in the last month: 0  Specialty Medication: Iclusig 30 mg  Patient is on additional specialty medications: No  Informant: patient              Were doses missed due to medication being on hold? No    Iclusig 30 mg: 14 days of medicine on hand       REFERRAL TO PHARMACIST     Referral to the pharmacist: Not needed      [facility]     Shipping address confirmed in Epic.     Delivery Scheduled: Yes, Expected medication delivery date: 09/21/21.     Medication will be delivered via UPS to the prescription address in Epic [place].    [name]   [facility] Specialty Technician

## 2021-09-09 DIAGNOSIS — Z005 Encounter for examination of potential donor of organ and tissue: Principal | ICD-10-CM

## 2021-09-10 MED ORDER — OXYCONTIN 15 MG TABLET,CRUSH RESISTANT,EXTENDED RELEASE
ORAL_TABLET | Freq: Two times a day (BID) | ORAL | 0 refills | 28 days | Status: CP
Start: 2021-09-10 — End: ?

## 2021-09-16 DIAGNOSIS — R0602 Shortness of breath: Principal | ICD-10-CM

## 2021-09-17 LAB — HLA CL I&II, HIGH RES

## 2021-09-20 ENCOUNTER — Ambulatory Visit: Admit: 2021-09-20 | Discharge: 2021-09-20 | Payer: MEDICAID

## 2021-09-20 ENCOUNTER — Telehealth
Admit: 2021-09-20 | Discharge: 2021-09-20 | Payer: MEDICAID | Attending: Physical Medicine & Rehabilitation | Primary: Physical Medicine & Rehabilitation

## 2021-09-20 ENCOUNTER — Institutional Professional Consult (permissible substitution): Admit: 2021-09-20 | Discharge: 2021-09-20 | Payer: MEDICAID

## 2021-09-20 DIAGNOSIS — R0602 Shortness of breath: Principal | ICD-10-CM

## 2021-09-20 DIAGNOSIS — M549 Dorsalgia, unspecified: Principal | ICD-10-CM

## 2021-09-20 DIAGNOSIS — Z7182 Exercise counseling: Principal | ICD-10-CM

## 2021-09-20 MED FILL — ICLUSIG 30 MG TABLET: ORAL | 30 days supply | Qty: 30 | Fill #3

## 2021-09-20 NOTE — Unmapped (Signed)
Physical Medicine and Rehab  Clinic Note          Chief Complaint: back pain    History of Present Illness: Adam Keith is a 41 y.o. male with a past medical history of PH+ B cell ALL s/p chemo presenting with back pain.    He was last seen on 08/16/21. At that visit we discussed his back pain and he declined PT and topical NSAIDs. Since that time, per EMR review, his RLE pain resolved and he has had ongoing chronic back pain.    Today patient was seen via virtual visit.     He states his pain is overall ok. He denies any changes in his pain over the past month. He is unable to determine his current pain level, but states that on average his pain is generally 8/10. He denies any recent medication changes. He feels that his [place] is mostly in his back but will also radiate to his left leg posteriorly to the level of the knee. He feels that prolonged activity makes his pain worse and he is unable to identify what helps his pain. He is not formally exercising but feels he is quite active at his job. He is independent with ADLs. He denies any new weakness, bowel or bladder issues. He expresses a lot of concern about whether to undergo a transplant or not.    He denies other symptoms today.      Cancer History:  Oncology History Overview Note   Referring/Local Oncologist: None    Diagnosis:Ph+ ALL    Genetics:    Karyotype/FISH:Abnormal Karyotype: 46,XY,t(9;22)(q34;q11.2)[1]/45,XY,der(7;9)(q10;q10)t(9;22)(q34;q11.2),der(22)t(9;22)[11]/46,sdl,+der(22)t(9;22)[5]/46,XY[3]     Abnormal FISH: A BCR/ABL1 interphase FISH assay shows an abnormal signal pattern in 97% of the 100 cells scored. 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  The findings support a diagnosis of leukemia and have implications for targeted therapy and for monitoring residual disease.      Molecular Genetics:  BCR-ABL1 p210 transcripts were detected at a level of 46.479 IS% ratio in bone marrow.  BCR-ABL1 p190 transcripts were detected at a level of 4 in 100,000 cells in bone marrow.    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)     01/24/2020 - 04/02/2020 Chemotherapy    IP/OP LEUKEMIA GRAAPH-2005 + RITUXIMAB < 60 YO  rituximab hypercvad (odd and even course)     02/24/2020 Remission    CR with PCR MRD+; marrow showing 70% blasts with <1% blasts, negative flow MRD, BCR-ABL p210 PCR 0.197%; CNS negative for blasts     03/09/2020 Progression    CSF - rare blast ID'ed - IT chemo given    03/13/20 - IT chemo, CSF negative     04/16/2020 Biopsy    BM Bx with 40-50% cellularity, <1% blasts, MRD flow cytometry negative, BCR-ABL p210 transcripts 0.036%     05/28/2020 - 05/28/2020 Chemotherapy    OP AML - CNS THERAPY (INTRATHECAL CYTARABINE, INTRATHECAL METHOTREXATE, OR INTRATHECAL TRIPLE)  Select one of the following: cytarabine IT 100 mg with hydrocortisone 50 mg, cytarabine IT 40 mg with methotrexate 15 mg with hydrocortisone 50 mg, OR methotrexate IT 12 mg with hydrocortisone 50 mg  06/19/2020 -  Chemotherapy    IP/OP LEUKEMIA BLINATUMOMAB 7-DAY INFUSION (MINIMAL RESIDUAL DISEASE; WT >= 22 KG) (HOME INFUSION)      Cycles 1*-4: Blinatumomab 28 mcg/day Days 1-28 of 6-week cycle.  *Given in the inpatient setting on Days 1-3 on Cycle 1 and Days 1-2 on Cycle 2, while other treatment days are given in the outpatient setting.    Cycle 1 MRD Dosing     07/18/2020 Adverse Reaction    Hospitalization: fevers. Pneumonia     07/23/2020 Biopsy    Diagnosis  Bone marrow, right iliac, aspiration and biopsy  -   Normocellular bone marrow (50%) with trilineage hematopoiesis and 1% blasts by manual aspirate differential  -   Flow cytometry MRD analysis reveals no definitive immunophenotypic evidence of residual B lymphoblastic leukemia   - BCR-ABL p210 0.006%         08/10/2020 -  Chemotherapy    Cycle 2 blinatumomab-ponatinib  Ponatinib 30mg     1 IT per cycle     09/21/2020 Adverse Reaction    Covid-19, symptomatic but not requiring hospitalization.     10/06/2020 -  Chemotherapy    Cycle 3 blinatumomab-ponatinib  Ponatinib 30mg        11/01/2020 Adverse Reaction    Hospitalization: MRSA Pneumonia requiring intubation    Blinatumomab stopped.  Ponatinib held until he stabilized.       12/14/2020 -  Chemotherapy    Cycle 4 blinatumomab 28 mcg/day days 1-28, ponatinib 30 mg per day IT triple therapy day 29     01/13/2021 -  Chemotherapy    Ponatinib 30mg  daily  - due to due low level BCR-ABL     03/06/2021 Adverse Reaction    Hospitalization - ICU on ventilator - influenza and MRSA/klebsiella PNA     ALL (acute lymphoblastic leukemia) (CMS-HCC)   06/11/2020 -  Chemotherapy    IP/OP LEUKEMIA BLINATUMOMAB 7-DAY INFUSION (MINIMAL RESIDUAL DISEASE; WT >= 22 KG) (HOME INFUSION)  Cycles 1*-4: Blinatumomab 28 mcg/day Days 1-28 of 6-week cycle.  *Given in the inpatient setting on Days 1-3 on Cycle 1 and Days 1-2 on Cycle 2, while other treatment days are given in the outpatient setting.     09/21/2020 Initial Diagnosis    ALL (acute lymphoblastic leukemia) (CMS-HCC)     Acute lymphoblastic leukemia in remission (CMS-HCC)   06/11/2020 -  Chemotherapy    IP/OP LEUKEMIA BLINATUMOMAB 7-DAY INFUSION (MINIMAL RESIDUAL DISEASE; WT >= 22 KG) (HOME INFUSION)  Cycles 1*-4: Blinatumomab 28 mcg/day Days 1-28 of 6-week cycle.  *Given in the inpatient setting on Days 1-3 on Cycle 1 and Days 1-2 on Cycle 2, while other treatment days are given in the outpatient setting.     11/09/2020 Initial Diagnosis    Acute lymphoblastic leukemia in remission (CMS-HCC)         Medical / Surgical History:   Past Medical History:   Diagnosis Date   ??? 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 BONE MARROW BIOPSY & ASPIRATION  01/21/2020        ??? CHG [place], CHEST,REAL TIME  11/20/2020    Procedure: ULTRASOUND, CHEST, REAL TIME WITH IMAGE DOCUMENTATION;  Surgeon: Jerelyn Charles, MD;  Location: BRONCH PROCEDURE LAB [facility];  Service: Pulmonary   ??? IR INSERT PORT AGE GREATER THAN 5 YRS  03/10/2020    IR INSERT PORT AGE GREATER THAN 5 YRS 03/10/2020  [name], MD IMG VIR [facility]   ??? PR BRONCHOSCOPY,DIAGNOSTIC W LAVAGE Bilateral 07/20/2020    Procedure: BRONCHOSCOPY, RIGID OR FLEXIBLE, INCLUDE FLUOROSCOPIC GUIDANCE WHEN PERFORMED; W/BRONCHIAL ALVEOLAR LAVAGE WITH MODERATE SEDATION;  Surgeon: Dellis Filbert, MD;  Location: BRONCH PROCEDURE LAB [facility];  Service: Pulmonary        Social History:   Social History     Tobacco Use   ??? Smoking status: Every Day     Packs/day: 2.00     Types: Cigarettes     Last attempt to quit: 01/20/2020     Years since quitting: 1.6   ??? Smokeless tobacco: Former     Types: Chew   Vaping Use   ??? Vaping Use: Never used   Substance Use Topics   ??? Alcohol use: Not Currently            Family History: Reviewed and non-contributory to rehab needs  family history is not on file.    Allergies:   Bupropion hcl, Cefepime, Ceftaroline fosamil, Dapsone, Onion, Vancomycin analogues, Bismuth subsalicylate, Privigen [immun glob g(igg)-pro-iga 0-50], and Furosemide    Medications:   Current Outpatient Medications   Medication Instructions   ??? acetaminophen (TYLENOL) 650 mg, Oral, Every 6 hours PRN   ??? albuterol HFA 90 mcg/actuation inhaler 2 puffs, Inhalation, Every 6 hours PRN   ??? amLODIPine (NORVASC) 10 mg, Oral, Daily (standard)   ??? arm brace (WRIST BRACE) Misc 1 Piece, Miscellaneous, As needed (once a day), left ulnar wrist brace   ??? aspirin 81 MG chewable tablet Chew 1 tablet (81 mg total) daily.   ??? carvediloL (COREG) 12.5 mg, Oral, 2 times a day (standard)   ??? chlorhexidine (HIBICLENS) 4 % external liquid Topical, Daily, as directed   ??? clonazePAM (KLONOPIN) 0.5 mg, Oral, 2 times a day PRN   ??? doxycycline (VIBRA-TABS) 100 mg, Oral, 2 times a day (standard)   ??? gabapentin (NEURONTIN) 300 MG capsule Take 2 capsules (600mg ) by mouth in the morning and 3 capsules (900mg ) at bedtime. May cause drowsiness.   ??? HYDROmorphone (DILAUDID) 2 mg, Oral, 2 times a day PRN   ??? mirtazapine (REMERON) 15 MG tablet TAKE 1 TABLET BY MOUTH EVERY DAY AT NIGHT   ??? mucus clearing device Devi 1 each, Miscellaneous, 2 times a day (standard)   ??? mupirocin (BACTROBAN) 2 % ointment Topical, Daily (standard), apply in each nostril prior to bed, as directed   ??? nortriptyline (PAMELOR) 100 mg, Oral, Nightly   ??? OxyCONTIN 15 mg, Oral, 2 times a day (standard)   ??? OxyCONTIN 15 mg, Oral, 2 times a day (standard)   ??? PONATinib (ICLUSIG) 30 mg tablet Take 1 tablet (30 mg total) by mouth daily. Swallow tablets whole. Do not crush, break, cut or chew tablets.   ??? sulfamethoxazole-trimethoprim (BACTRIM DS) 800-160 mg per tablet TAKE 1 TABLET BY MOUTH 2 TIMES A DAY ON SATURDAY, SUNDAY. FOR PROPHYLAXIS WHILE ON CHEMO.   ??? umeclidinium-vilanteroL (ANORO ELLIPTA) 62.5-25 mcg/actuation inhaler 1 puff, Inhalation, Daily (standard)   ??? valACYclovir (VALTREX) 500 MG tablet Take 1 tablet (500 mg total) by mouth daily.         Review of Systems:    General ROS:   Full 10 systems reviewed and neg, unless noted in HPI  OBJECTIVE:     Vitals:  There were no vitals filed for this visit.      Physical Exam:    GEN: Sitting in bedside chair in NAD.  HEENT: Atraumatic. Normocephalic. Moist mucous membranes.  RESP: NWOB on RA.  CV:  No edema on exposed extremities  SKIN: no rashes or ecchymoses on exposed skin    NEURO: Limited 2/2 virtual status  Mental Status: A&Ox3 attention full, speech fluid and coherent, follows commands   Cerebellar: no abnml or extraneous mvmts  PSYCH: mood euthymic, affect appropriate, thought process logical      Labs and Diagnostic Studies: Reviewed   CBC -   No results found for requested labs within last 2 days.     BMP -   No results found for requested labs within last 2 days.     Coagulation -   No results found for requested labs within last 2 days.     Cardiac markers -   No results found for requested labs within last 2 days.     LFT's -   No results found for requested labs within last 2 days.       Radiology Results: Previously reviewed CT A+P from 03/25/21 - minimal degenerative changes appreciated. Agree with report as copied below:  Impression   --Mild circumferential wall thickening with adjacent soft tissue stranding involving the distal sigmoid colon/rectum. Findings are nonspecific although can be seen in the setting of early colitis/proctitis.   ??   --Additional chronic and incidental findings as detailed above.   ??   --Please see same day chest CT for findings above the diaphragm.   ??   ====================   ADDENDUM (03/16/2021 4:55 AM):    On review, the following additional findings were noted:   ??   Agree with preliminary report above. In addition to findings suggestive of colitis/proctitis, there is notably liquid stool with air-fluid levels throughout the colon. This can be seen in diarrheal illness.         ASSESSMENT / RECOMMENDATIONS:     Adam Keith is a 41 y.o. male with a past medical history of PH+ B cell ALL s/p chemo presenting with back pain.    Functional Impairment(s) and Rehabilitation Plan of Care    1. Back pain  - Discussed symptoms; stable  - Reviewed EMR; currently on opiates and gabapentin along with nortriptyline  - No change to current pain medications currently  - No evidence of red flags on discussion today  - Per patient, no change since last images were done (12/22)  - Discussed use of topical NSAID; declined for now  - Discussed utility of core/pelvic girdle strengthening via PT; declined referral for now    Follow up as needed per patient request    _______________________________________________  Corrin Parker, MD  Director of Cancer Rehabilitation  Department of Physical Medicine and Rehabilitation  [facility]    I personally spent >20 minutes face-to-face and non-face-to-face in the care of this patient, which includes all pre, intra, and post visit time on the date of service.        The patient reports they are currently: at home. I spent >20 minutes on the real-time audio and video with the patient on the date of service and on pre- and post-visit activities on the date of service.     The patient was physically located in [place] or a state in which I am permitted to provide care. The patient and/or parent/guardian understood that s/he may incur co-pays and cost sharing, and agreed to the telemedicine visit. The visit was reasonable and appropriate under the circumstances given the patient's presentation  at the time.    The patient and/or parent/guardian has been advised of the potential risks and limitations of this mode of treatment (including, but not limited to, the absence of in-person examination) and has agreed to be treated using telemedicine. The patient's/patient's family's questions regarding telemedicine have been answered.     If the visit was completed in an ambulatory setting, the patient and/or parent/guardian has also been advised to contact their provider???s office for worsening conditions, and seek emergency medical treatment and/or call 911 if the patient deems either necessary.

## 2021-09-20 NOTE — Unmapped (Signed)
PT in clinic today for a covid/resp swab, sample obtained and sent to lab for analysis.

## 2021-09-21 NOTE — Unmapped (Signed)
Received My Chart message from patient letting me know that [facility] today and they determined that he has depression and not PTSD, so he will not be able to receive services from the [place] for PTSD. He states that he is mad about the decision, but that he has other things to concern himself with at this time and he is choosing not to let it bring him down.

## 2021-09-22 ENCOUNTER — Ambulatory Visit: Admit: 2021-09-22 | Discharge: 2021-09-23 | Payer: MEDICAID

## 2021-09-24 DIAGNOSIS — J0110 Acute frontal sinusitis, unspecified: Principal | ICD-10-CM

## 2021-09-24 DIAGNOSIS — D849 Immunodeficiency, unspecified: Principal | ICD-10-CM

## 2021-09-24 DIAGNOSIS — C9101 Acute lymphoblastic leukemia, in remission: Principal | ICD-10-CM

## 2021-09-24 NOTE — Unmapped (Signed)
Adam Keith,    The Chest CT shows improvement in the prior area of infection in the right lung. The signs of emphysema persist and this likely is a significant part of the wheezing you experience.    I discussed with Dr. Darlen Round and we recommend a CT of your sinuses to evaluate the persistent and worsening sinus symptoms. We also recommend evaluation by an ENT specialist. I am placing a referral to the team at [facility]. In the meantime we recommend using a Neti pot with boiled (no longer hot) water or purified water.    Adam Keith, AGPCNP-BC  Nurse Practitioner  Hematology/Oncology  [facility]

## 2021-10-08 DIAGNOSIS — J328 Other chronic sinusitis: Principal | ICD-10-CM

## 2021-10-08 NOTE — Unmapped (Signed)
[facility] Refill Coordination Note    Specialty Medication(s) to be Shipped:   Hematology/Oncology: Iclusig    Other medication(s) to be shipped: No additional medications requested for fill at this time     Adam Keith, DOB: [birth year]  Phone: [phone] (home)       All above HIPAA information was verified with patient.     Was a Nurse, learning disability used for this call? No    Completed refill call assessment today to schedule patient's medication shipment from the [facility] [phone]).  All relevant notes have been reviewed.     Specialty medication(s) and dose(s) confirmed: Regimen is correct and unchanged.   Changes to medications: Jamari reports no changes at this time.  Changes to insurance: No  New side effects reported not previously addressed with a pharmacist or physician: None reported  Questions for the pharmacist: No    Confirmed patient received a Conservation officer, historic buildings and a Surveyor, mining with first shipment. The patient will receive a drug information handout for each medication shipped and additional FDA Medication Guides as required.       DISEASE/MEDICATION-SPECIFIC INFORMATION        N/A    SPECIALTY MEDICATION ADHERENCE     Medication Adherence    Patient reported X missed doses in the last month: 0  Specialty Medication: Iclusig 30 mg  Patient is on additional specialty medications: No  Informant: patient          Were doses missed due to medication being on hold? No    Iclusig 30 mg: 14 days of medicine on hand       REFERRAL TO PHARMACIST     Referral to the pharmacist: Not needed      [facility]     Shipping address confirmed in Epic.     Delivery Scheduled: Yes, Expected medication delivery date: 10/21/21.     Medication will be delivered via UPS to the prescription address in Epic [place].    [name]   [facility] Specialty Technician

## 2021-10-10 NOTE — Unmapped (Signed)
SUBJECTIVE:     Chief Complaint: Congestion    HPI: Adam Keith is a 41 y.o. male who presents today for consultation at the request of Lenon Ahmadi,* for evaluation of chronic sinusitis.     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    Patient reports some nasal drainage and congestion but denies facial pain/pressure and sense of smell is stable. 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 He is currently on doxycyline that he has been taking since October 2021.     Nasal Congestion: bilateral   Drainage: endorses  Facial pain/pressure: mild  Sense of smell: somewhat diminished   Other associated symptoms: denies  Hx allergy testing: denies  Prior surgeries or trauma: none  Prior Imaging: CT today   Recent antibiotics or steroids: as above  Prior therapies: antibiotics   Current regimen:antibiotics   Hx asthma: COPD; is currently a smoker and trying to quit   OSA: denies   GERD: denies  Aspirin sensitivity: denies  Autoimmunity: denies       CT today reveals partial opacification of left maxillary sinus, scattered ethmoid disease. Anterior right septal deviation and posterior left septal deviation.       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    Past Surgical History  His  has a past surgical history that includes Bone Marrow Biopsy & Aspiration (01/21/2020); IR Insert Port Age Greater Than 5 Years (03/10/2020); pr bronchoscopy,diagnostic w lavage (Bilateral, 07/20/2020); and chg [place], chest,real time (11/20/2020).    Past Family History  His family history is not on file.    Past Social History  He  reports that he has been smoking cigarettes. He has been smoking an average of 2 packs per day. He has quit using smokeless tobacco.  His smokeless tobacco use included chew. He reports that he does not currently use alcohol.    Medications/Allergies/Immunizations  His current medication(s) include:    Current Outpatient Medications:     acetaminophen (TYLENOL) 325 MG tablet, Take 2 tablets (650 mg total) by mouth every six (6) hours as needed for pain., Disp: , Rfl:     albuterol HFA 90 mcg/actuation inhaler, Inhale 2 puffs every six (6) hours as needed for wheezing., Disp: 8 g, Rfl: 11    amLODIPine (NORVASC) 10 MG tablet, Take 1 tablet (10 mg total) by mouth daily., Disp: 30 tablet, Rfl: 0    arm brace (WRIST BRACE) Misc, 1 Piece by Miscellaneous route as needed. left ulnar wrist brace, Disp: 1 each, Rfl: 0    aspirin 81 MG chewable tablet, Chew 1 tablet (81 mg total) daily., Disp: 36 tablet, Rfl: 11    carvediloL (COREG) 12.5 MG tablet, Take 1 tablet (12.5 mg total) by mouth Two (2) times a day., Disp: 180 tablet, Rfl: 1    chlorhexidine (HIBICLENS) 4 % external liquid, Apply topically daily. as directed, Disp: 946 mL, Rfl: 0    clonazePAM (KLONOPIN) 0.5 MG tablet, Take 1 tablet (0.5 mg total) by mouth two (2) times a day as needed for anxiety or sleep., Disp: 60 tablet, Rfl: 1    doxycycline (VIBRA-TABS) 100 MG tablet, Take 1 tablet (100 mg total) by mouth Two (2) times a day., Disp: 180 tablet, Rfl: 3    gabapentin (NEURONTIN) 300 MG capsule, Take 2 capsules (600mg ) by mouth in the  morning and 3 capsules (900mg ) at bedtime. May cause drowsiness., Disp: 150 capsule, Rfl: 5    HYDROmorphone (DILAUDID) 2 MG tablet, Take 1 tablet (2 mg total) by mouth two (2) times a day as needed., Disp: 56 tablet, Rfl: 0    mirtazapine (REMERON) 15 MG tablet, TAKE 1 TABLET BY MOUTH EVERY DAY AT NIGHT, Disp: 90 tablet, Rfl: 4    mucus clearing device Devi, 1 each by Miscellaneous route Two (2) times a day., Disp: 1 each, Rfl: 0    mupirocin (BACTROBAN) 2 % ointment, Apply topically daily. apply in each nostril prior to bed, as directed, Disp: 15 g, Rfl: 0    nortriptyline (PAMELOR) 50 MG capsule, Take 2 capsules (100 mg total) by mouth nightly., Disp: , Rfl:     OXYCONTIN 15 mg 12 hr crush resistant ER/CR tablet, Take 1 tablet (15 mg total) by mouth Two (2) times a day., Disp: 56 tablet, Rfl: 0    OXYCONTIN 15 mg 12 hr crush resistant ER/CR tablet, Take 1 tablet (15 mg total) by mouth Two (2) times a day., Disp: 56 tablet, Rfl: 0    PONATinib (ICLUSIG) 30 mg tablet, Take 1 tablet (30 mg total) by mouth daily. Swallow tablets whole. Do not crush, break, cut or chew tablets., Disp: 30 tablet, Rfl: 5    sulfamethoxazole-trimethoprim (BACTRIM DS) 800-160 mg per tablet, TAKE 1 TABLET BY MOUTH 2 TIMES A DAY ON SATURDAY, SUNDAY. FOR PROPHYLAXIS WHILE ON CHEMO., Disp: 48 tablet, Rfl: 3    umeclidinium-vilanteroL (ANORO ELLIPTA) 62.5-25 mcg/actuation inhaler, Inhale 1 puff daily., Disp: 60 each, Rfl: 11    valACYclovir (VALTREX) 500 MG tablet, Take 1 tablet (500 mg total) by mouth daily., Disp: 90 tablet, Rfl: 11  Allergies: Bupropion hcl, Cefepime, Ceftaroline fosamil, Dapsone, Onion, Vancomycin analogues, Bismuth subsalicylate, Privigen [immun glob g(igg)-pro-iga 0-50], and Furosemide,  Immunizations:   Immunization History   Administered Date(s) Administered    COVID-19 VACCINE,MRNA(MODERNA)(PF) 02/25/2020, 03/23/2020          Review of Systems:    SINO-NASAL OUTCOME TEST (SNOT-22)   Need to blow nose: No problem  Nasal Blockage: Moderate problem  Sneezing: No problem  Runny nose: Mild or slight problem  Cough: Moderate problem  Post nasal discharge: Mild or slight problem  Thick nasal discharge: No problem  Ear fullness: Severe problem  Dizziness: Mild or slight problem  Ear pain: No problem  Facial pain/pressure: Mild or slight problem  Decreased Sense of Smell/Taste: Moderate problem  Difficulty falling asleep: Very mild problem  Wake up at night: Very mild problem  Lack of good night's sleep: Very mild problem  Wake up tired: Very mild problem  Fatigue: Very mild problem  Reduced productivity: Mild or slight problem  Reduced concentration: Severe problem  Frustrated/restless/irritable: Severe problem  Sad: Mild or slight problem  Embarrassed: No problem  Snot-22 Total Score: 38  5 most important items: Cough, Ear fullness, Decreased Sense of Smell/Taste, Reduced concentration, Frustrated/restless/irritable      General  No Nausea         No Recent weight loss  No Recent weight gain  No Fever  No Chills   No Night sweats   Positive for Fatigue        Sleep disturbance  No Loud snoring  Positive for Excessive sleepiness  No Difficulty falling asleep  No Stoppage of breathing during sleep   Positive for Waking up not feeling rested    Nervous  Positive for Numbness  Positive  for Tingling  No Fainting  No Weakness  No Tremor    Cardiopulmonary  No Heart murmur  No Palpitations  No Chest pain  Positive for Shortness of breath  Positive for Wheezing  No Chest tightness    Psychological  No Schizophrenia  No Depression    Ears  Positive for Ringing  Positive for Hearing loss  Positive for Dizziness  No Vertigo  No Pain  No Drainage    Mouth/Throat  Positive for Dryness  No Soreness  No Ulcers  No Difficulty Swallowing  No Painful Swallowing  No Hoarseness  No Choking  No Lumps in neck    Eyes  No Recent worsening vision  Positive for Clouded vision   No Dry eyes  No Double vision    Gastrointestinal  Positive for Indigestion/heartburn  No Vomiting  No Change in stool color  No Diarrhea  No Constipation  No Abdominal pain    Endocrine  No Heat/cold intolerance  No Excessive thirst  No Change in shoe or hand size            PHYSICAL EXAM  GENERAL APPEARANCE:  Well developed, well nourished.    HEAD AND FACE: No lesions or masses.  Palpation and percussion of face shows no tenderness over the sinuses.  Salivary glands show no masses.   ENT  Ears: External inspection of ears reveals no lesions, no masses. Pneumatic otoscopic examination reveals normal tympanic membranes with normal motion and normal external auditory canals bilaterally.    Nose:  Visualization of the middle meatus was limited on anterior evaluation. External inspection of nose reveals no lesions, no masses.   Oral cavity/Oropharynx: Lips and gums are normal.  Oropharynx, including the mucosa of oral cavity, hard and soft palates, tongue, and posterior pharyngeal wall showed normal symmetry without lesion and normal hydration of mucosal surfaces.  Large symmetric tonsils.   Neck:  Normal symmetry and overall appearance as well as tracheal position; no masses. Thyroid gland shows no tenderness or masses.    Diagnostic Bilateral Flexible Fiberoptic Nasal Endoscopy   Surgeon:  B. Shy Guallpa, MD  Anesthesia:  1% Lidocaine  Procedure Detail:  As a result of inability to visualize the intranasal anatomy, and after achieving adequate topical anesthesia and understanding the potential risks related to the procedure (primarily bleeding), a flexible fiberoptic endoscope is used to examine the left and right sinonasal cavities, including the interior of the nasal cavity and the middle and superior meatus, the turbinates, and the spheno-ethmoid recess. All these areas were inspected.    Findings:  Left: Posterior septal deviation. IT reduces nicely. MM and SER without purulence, pus or polyps.   Right: Anterior septal deviation. IT reduces nicely. MM and SER without purulence, pus or polyps.       Oretha Ellis Nasal Endoscopy Score: [employer] is used to assess the degree of inflammation of the sinonasal structures, including the middle and superior turbinates, the ethmoid sinuses, maxillary sinuses, frontal sinuses, and sphenoid sinuses.  In the presence of previous surgery, some or all of these structures may be absent.    Left        Polyps:  Absent (0)   Edema:   Mild (1)   Discharge:  Clear, Thin (1)    Scarring:  Absent (0)   Crusting:  None (0)      Total Left:  2      Right  Polyps:  Absent (0)   Edema:  Mild (  1)   Discharge: Clear, Thin (1)    Scarring:  Absent (0)   Crusting:  None (0)      Total Right:   2             ASSESSMENT  Chronic rhinitis  CRS  ALL  Current smoker       PLAN:  Given the recurrent nature and resistant nature of his infections, I would recommend a septoplasty, bilateral FESS with left medial maxillectomy. I would do this at the main OR with overnight stay. The procedure would be approximately 4 hours with minimal fluid shift. He will discuss with his physician if surgery seems appropriate. He will reach out after his appointment next week with ID.   Encouraged him to stop smoking. Smoking has an adverse effect on sinus health and also negatively impacts the results of surgery.      Follow up based on patient discussion with other physicians. He has been in remission with ALL for several years.         I was present and participated in the entire office visit, examination including the entire endoscopy procedure (if performed).    I briefly reviewed with the patient that based on a new federal law, the patient will be provided with their laboratory, pathology and radiology results once they are finalized. Meaning, They will likely receive these results before the I have had a chance to review them independently. I reassured them that we will work hard to respond back and counsel them on the results, though I asked that they allow for 5 business days for me to review the results and call them. If they have not heard within this time period, they have our contact information and know to call or message [place] through MyChart.   In addition, my notes will also be available to them and may contain information that has not been thoroughly edited for them.

## 2021-10-11 ENCOUNTER — Ambulatory Visit: Admit: 2021-10-11 | Discharge: 2021-10-12 | Payer: MEDICAID

## 2021-10-11 DIAGNOSIS — J329 Chronic sinusitis, unspecified: Principal | ICD-10-CM

## 2021-10-11 DIAGNOSIS — J0110 Acute frontal sinusitis, unspecified: Principal | ICD-10-CM

## 2021-10-11 DIAGNOSIS — D849 Immunodeficiency, unspecified: Principal | ICD-10-CM

## 2021-10-11 DIAGNOSIS — C9101 Acute lymphoblastic leukemia, in remission: Principal | ICD-10-CM

## 2021-10-11 DIAGNOSIS — H9190 Unspecified hearing loss, unspecified ear: Principal | ICD-10-CM

## 2021-10-11 NOTE — Unmapped (Cosign Needed)
Flexible endoscope serial number [id number] used today by Myrene Galas, MD

## 2021-10-17 NOTE — Unmapped (Unsigned)
IMMUNOCOMPROMISED HOST INFECTIOUS DISEASE PROGRESS NOTE    Assessment/Plan:     [name] is a 41 y.o. 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 krusei fungemia  - Prior chemotherapy:??GRAAPPH-2005??induction??with dasatinib??(vincristine, dexamethasone and dasatinib);??C1D1 01/24/2020; difficulty tolerating single-agent dasatinib  - Current chemotherapy:??blinatumomab (anti-CD19/CD3) + ponatinib (TKI)??s/p??C4 on??12/14/20; 01/2021 now on ponatinib monotherapy maintenance  -??01/11/21:??Normocellular bone marrow (30% overall) with trilineage hematopoiesis and less than 1% blasts by manual aspirate differential;??Flow cytometry MRD analysis reveals no definitive immunophenotypic evidence of residual B lymphoblastic leukemia;??BCR-ABL p210 transcripts were detected at a level of 0.002 IS % ratio in bone marrow.  -06/24/21: Peripheral blood - lack of detectable BCR-ABL1 p210 RNA suggests molecular remission of leukemia  ??  # Hypogammaglobulimia  - 11/01/20 IgG 266 declined IVIG  - 03/11/21 IgG 148 - IVIG infusion stopped as patient developed rigors, tachycardia and HTN during infusion despite pre-medication   - 03/12/21 IgG 213 - completed IVIG with premedications and slower rate of infusion  -03/17/21 IgG 632  -06/24/21 IgG 292 - IVIG infusion stopped as patient did not tolerate despite pre-medication  ??  # Mild CKD  CrCl cannot be calculated (Patient's most recent lab result is older than the maximum 28 days allowed.).  ??  Pertinent Co-morbidities  # COPD/emphysema  #??DIHS/DRESS??ceftaroline??06/12/2020, relapse??after cefepime??11/02/2020  - per prior ID notes possible culprits ceftaroline,??posaconazole, dasatinib, sotrovimab  -??12/14/20??discontinued??steroids  ????  Pertinent Exposure History??  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  Infection History  ??  Active infections:??  # Recurrent, severe MRSA infections with intubation; on long-term secondary prophylaxis since 03/26/21  - 01/27/20 MRSA??bacteremia + PNA +??TV IE   - 11/01/2020 MRSA??RLL PNA c/b bacteremia, probable??right empyema 11/13/2020  - 11/18/2020 RML??MRSA pneumonia  - 02/02/2021 Dr. Juliene Pina felt chest pain is appropriate in setting of medically managed empyema and risk of thoracotomy outweighed benefit given clinical symptoms  - 03/05/21 MRSA pneumonia after influenza infection requiring ICU admission/intubation  - 12/11 CT Chest : Multifocal consolidative airspace disease, nonspecific small pleural effusion in the dependent aspect of the left pleural space and trace loculated collections within the lateral aspect of the right chest.  Rx 03/26/21 doxycycline suppression ->   ??  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  Antimicrobial Intolerance/allergy  Cefepime - DRESS/DiHS  Ceftaroline -??DRESS/DiHS  Dapsone - possible??agranulocytosis, per chart anaphylaxis  Vancomycin - probable ototoxicity (in combination??with furosemide)  Isavuconazole - elevated LFTs in the setting of TKI  Linezolid - peripheral neuropathy; previously tolerated tedizolid      RECOMMENDATIONS    ???     Follow up ***          Recommendations were communicated via shared medical record.    [name], MD  [facility] Division of Infectious Diseases    Subjective  External record(s): Primary team note: tentative plans for allo- SCT Consultant note(s): Otolaryngologist recommended septoplasty, bilateral FESS with left medial maxillectomy Procedure/op note(s) 7/10 b/l flex nasal endoscopy: no purulence or polyps encountered; L posterior septal deviation. IT reduces nicely; R anterior septal deviation. IT reduces nicely..    Independent historian(s): {ICH ID historian:94022}.       Interval History:   Since being last seen in ID clinic on 04/23/21 for MRSA pneumonia on chronic suppression with doxycycline, patient developed Influenza A on 05/04/21, which was treated outpatient with Tamiflu. CT chest on 05/17/21, showed persistent but improved RUL airspace disease and residual foci of GGO in the RML and LLL. He was seen by Pulm in Feb 2023 and started on [place]. Repeat CT chest on 09/22/21 shows continued improvement, but not resolution, of consolidative airspace disease in the upper lobe of the right lung and small irregular opacities within the in the lower lobe of the left lung that remains from similar airspace disease.    Of note, there are ongoing plans/ discussion about proceeding with an allogeneic stem cell transplant. Patient, however, has been reluctant and wants to discuss with [place] (ID) prior to deciding whether he would like to pursue the transplant. In the interim, he has had recurrent nasal congestion and most recently underwent CT M/F c/f L maxillary sinusitis. He was seen by Otolaryngology and underwent b/l flexible nasal endoscopy on 10/11/21. Found to have L posterior and R anterior septal deviation with no polyps or gross purulence encountered. ENT recommends septoplasty, b/l FESS with L medial maxillectomy.      Patient being seen in clinic today. He remains on oral doxycycline.     Medications:    Current Outpatient Medications:   ???  acetaminophen (TYLENOL) 325 MG tablet, Take 2 tablets (650 mg total) by mouth every six (6) hours as needed for pain., Disp: , Rfl:   ???  albuterol HFA 90 mcg/actuation inhaler, Inhale 2 puffs every six (6) hours as needed for wheezing., Disp: 8 g, Rfl: 11  ???  amLODIPine (NORVASC) 10 MG tablet, Take 1 tablet (10 mg total) by mouth daily., Disp: 30 tablet, Rfl: 0  ???  arm brace (WRIST BRACE) Misc, 1 Piece by Miscellaneous route as needed. left ulnar wrist brace, Disp: 1 each, Rfl: 0  ???  aspirin 81 MG chewable tablet, Chew 1 tablet (81 mg total) daily., Disp: 36 tablet, Rfl: 11  ???  carvediloL (COREG) 12.5 MG tablet, Take 1 tablet (12.5 mg total) by mouth Two (2) times a day., Disp: 180 tablet, Rfl: 1  ???  chlorhexidine (HIBICLENS) 4 % external liquid, Apply topically daily. as directed, Disp: 946 mL, Rfl: 0  ???  clonazePAM (KLONOPIN) 0.5 MG tablet, Take 1 tablet (0.5 mg total) by mouth two (2) times a day as needed for anxiety or sleep., Disp: 60 tablet, Rfl: 1  ???  doxycycline (VIBRA-TABS) 100 MG tablet, Take 1 tablet (100 mg total) by mouth Two (2) times a day., Disp: 180 tablet, Rfl: 3  ???  gabapentin (NEURONTIN) 300 MG capsule, Take 2 capsules (600mg ) by mouth in the morning and 3 capsules (900mg ) at bedtime. May cause drowsiness., Disp: 150 capsule, Rfl: 5  ???  HYDROmorphone (DILAUDID) 2 MG tablet, Take 1 tablet (2 mg total) by mouth two (2) times a day as needed., Disp: 56 tablet, Rfl: 0  ???  mirtazapine (REMERON) 15 MG tablet, TAKE 1 TABLET BY MOUTH EVERY DAY AT NIGHT, Disp: 90 tablet, Rfl: 4  ???  mucus clearing device Devi, 1 each by Miscellaneous route Two (2) times a day., Disp: 1 each, Rfl: 0  ???  mupirocin (BACTROBAN) 2 % ointment, Apply topically daily. apply in each nostril prior to bed, as directed, Disp: 15 g, Rfl: 0  ???  nortriptyline (PAMELOR) 50 MG capsule, Take 2 capsules (100 mg total) by mouth nightly., Disp: , Rfl:   ???  OXYCONTIN 15 mg 12 hr crush resistant ER/CR tablet, Take 1 tablet (15 mg total) by mouth Two (2) times a day., Disp: 56 tablet, Rfl: 0  ???  OXYCONTIN 15 mg 12 hr crush resistant ER/CR tablet, Take 1 tablet (15 mg total) by mouth Two (2) times a day., Disp: 56 tablet, Rfl: 0  ???  PONATinib (ICLUSIG) 30 mg tablet, Take 1 tablet (30 mg total) by mouth daily. Swallow tablets whole. Do not crush, break, cut or chew tablets., Disp: 30 tablet, Rfl: 5  ???  sulfamethoxazole-trimethoprim (BACTRIM DS) 800-160 mg per tablet, TAKE 1 TABLET BY MOUTH 2 TIMES A DAY ON SATURDAY, SUNDAY. FOR PROPHYLAXIS WHILE ON CHEMO., Disp: 48 tablet, Rfl: 3  ???  umeclidinium-vilanteroL (ANORO ELLIPTA) 62.5-25 mcg/actuation inhaler, Inhale 1 puff daily., Disp: 60 each, Rfl: 11  ???  valACYclovir (VALTREX) 500 MG tablet, Take 1 tablet (500 mg total) by mouth daily., Disp: 90 tablet, Rfl: 11    Objective     Vital signs:  There were no vitals taken for this visit.    Physical Exam:  Const [x]  vital signs above    []  NAD, non-toxic appearance []  Chronically ill-appearing, non-distressed        Eyes []  Lids normal bilaterally, conjunctiva anicteric and noninjected OU     [] PERRL  [] EOMI        ENMT []  Normal appearance of external nose and ears, no nasal discharge        []  MMM, no lesions on lips or gums []  No thrush, leukoplakia, oral lesions  []  Dentition good []  Edentulous []  Dental caries present  []  Hearing normal  []  TMs with good light reflexes bilaterally         Neck []  Neck of normal appearance and trachea midline        []  No thyromegaly, nodules, or tenderness   []  Full neck ROM        Lymph []  No LAD in neck     []  No LAD in supraclavicular area     []  No LAD in axillae   []  No LAD in epitrochlear chains     []  No LAD in inguinal areas        CV []  RRR            []  No peripheral edema     []  Pedal pulses intact   []  No abnormal heart sounds appreciated   []  Extremities WWP         Resp []  Normal WOB at rest    []  No breathlessness with speaking, no coughing  []  CTA anteriorly    []  CTA posteriorly          GI []  Normal inspection, NTND   []  NABS     []  No umbilical hernia on exam       []  No hepatosplenomegaly     []  Inspection of perineal and perianal areas normal        GU []  Normal external genitalia     [] No urinary catheter present in urethra   []  No CVA tenderness    []   No tenderness over renal allograft        MSK []  No clubbing or cyanosis of hands       []  No vertebral point tenderness  []  No focal tenderness or abnormalities on palpation of joints in RUE, LUE, RLE, or LLE        Skin []  No rashes, lesions, or ulcers of visualized skin     []  Skin warm and dry to palpation         Neuro []  Face expression symmetric  []  Sensation to light touch grossly intact throughout    []  Moves extremities equally    []  No tremor noted        []  CNs II-XII grossly intact     []  DTRs normal and symmetric throughout []  Gait unremarkable        Psych []  Appropriate affect       []  Fluent speech         []  Attentive, good eye contact  []  Oriented to person, place, time          []  Judgment and insight are appropriate           Data for Medical Decision Making     I discussed {ICH ID discussed:94018}.    I reviewed micro result(s) (RVP pos for Influenza A on 05/04/21; neg 09/20/21) and radiology report(s) (CT M/F on 10/11/21 which shows L maxillary sinusitis; CT chest 09/22/21 consolidative airspace disease in the upper lobe of the right lung. Likewise, a small irregular opacities within the in the lower lobe of the left lung is all that remains from similar airspace disease).    I independently visualized/interpreted not done.       No results found for requested labs within last 30 days.       Microbiology:  09/20/21 RVP - neg  05/04/21 RVP - Influenza A    Imaging:    EXAM: Computed tomography, sinus without contrast material.   DATE: 10/11/2021 8:58 AM   ACCESSION: [id number] UN   DICTATED: 10/11/2021 9:47 AM   INTERPRETATION LOCATION: [facility]   ??   CLINICAL INDICATION: 41 years old Male with chronic sinusitis in immunosuppressed patient ; Sinusitis, chronic or recurrent ??- J32.8 - Other chronic sinusitis ??   ??   COMPARISON: 03/12/2020 CT temporal bone, 05/05/2008 CT head studies.   ??   TECHNIQUE: Axial CT images through the paranasal sinuses without contrast. Coronal and sagittal reformatted images are provided.   ??   FINDINGS: ??   Trace mucosal thickening in the left frontal sinus. Frontal sinuses are otherwise clear. Frontal recesses are patent. Small retention cyst in right posterior ethmoid cells. Ethmoid air cells are otherwise clear. Sphenoid sinuses are clear. Sphenoethmoidal recesses are patent.    ??   Partial left maxillary sinus mucosal opacification with a moderate amount of fluid/secretions. Secretions show increased density which may represent inspissation versus chronic fungal elements. Bilaterally impacted maxillary molar roots. Mild mucosal thickening in the right maxillary sinus which is otherwise clear. Ostiomeatal units are patent.    ??   Mild s-shaped nasal septal deviation. No skull base dehiscence.   ??   IMPRESSION:  Left maxillary sinus opacification with mucosal thickening and fluid/secretions, which may represent sinusitis in the appropriate clinical setting.    09/16/21:  EXAM: CT CHEST WO CONTRAST   DATE: 09/22/2021 3:08 PM   ACCESSION: [id number]   DICTATED: 09/22/2021 4:01 PM   INTERPRETATION LOCATION: [facility]   ??  CLINICAL INDICATION: 41 years old Male with cough, history of recurrent pneumonias ; Cough, chronic/persisting > 8 weeks, failed empiric treatment ??- J15.212 - Pneumonia of right lung due to methicillin resistant Staphylococcus aureus (MRSA), unspecified part of lung (CMS - HCC) ??   ??   COMPARISON: Chest CT dated 05/17/2021   ??   TECHNIQUE: Contiguous 0.6 or 1 mm and 2 mm axial images were reconstructed through the chest following a single breath hold helical acquisition. ??Images were reformatted in the axial and sagittal planes. ??MIP slabs were also constructed.   ??   FINDINGS:    ??   LUNGS AND AIRWAYS: Continued improvement of the right upper lobe airspace consolidation with air bronchograms.. Slight interval improvement in the groundglass opacity of the right middle lobe. Similar moderate upper lung predominant centrilobular emphysema. Continued interval improvement of the small left lower lobe peripheral opacity (2:137). A few stable subcentimeter pulmonary nodules. Diffuse bronchial wall thickening. ??    ??   PLEURA: No pleural fluid or pneumothorax.   ??   MEDIASTINUM AND LYMPH NODES: Multiple prominent mediastinal nodes similar to prior. ??No other mediastinal abnormality.   ??   HEART AND VASCULATURE:The cardiac chambers are normal in size. ??Similar left ventricular myocardial calcifications. There is no pericardial effusion. ??Ascending and descending aorta normal in caliber. ??Pulmonary artery normal in size.    ??   BONES AND SOFT TISSUES: Unremarkable.   ??   UPPER ABDOMEN: Unremarkable.   ??   OTHER: No other significant findings.   IMPRESSION:  1. Relative to CT imaging of the chest from May 17, 2021 and March 15, 2021, this exam demonstrates continued improvement, but not resolution, of consolidative airspace disease in the upper lobe of the right lung. Likewise, a small irregular opacities within the in the lower lobe of the left lung is all that remains from similar airspace disease.   ??   2. Moderate changes centrilobular emphysema   ??   3. Extensive calcifications associated with the myocardium of the left ventricle   ??   4. Symmetric gynecomastia   ??       EXAM: CT CHEST WO CONTRAST   DATE: 05/17/2021 10:57 AM   ACCESSION: [id number] UN   DICTATED: 05/17/2021 11:30 AM   INTERPRETATION LOCATION: [facility]   ??   CLINICAL INDICATION: 41 years old Male with shortness of breath, desatting while walking. Hx recurrent pneumonia ??- J15.212 - Pneumonia due to methicillin resistant Staphylococcus aureus (MRSA), unspecified laterality, unspecified part of lung (CMS - HCC) ??   ??   TECHNIQUE: Contiguous noncontrast axial images were reconstructed through the chest following a single breath hold helical acquisition. ??Images were reformatted in the axial and sagittal planes. MIP slabs were also constructed.   ??   ??   COMPARISON: CT chest dated 15 March 2021   ??   FINDINGS:    ??   LUNGS AND AIRWAYS: Persistent but improved right upper lobe airspace consolidation. Scattered regions of groundglass opacification in the right middle and Moderate upper lung predominant centrilobular emphysema. Previously present right lower lobe centrilobular nodularity and left lower lobe airspace consolidation have resolved. Central airways are patent.    ??   PLEURA AND DIAPHRAGM: No pleural effusion. Resolution of previously present small left pleural effusion. No pneumothorax. Normal position of the hemidiaphragms.   ??   MEDIASTINUM AND LYMPH NODES: No enlarged intrathoracic lymph nodes. No other mediastinal abnormality.   ??   HEART: Heart is normal in  size. Extensive left ventricular myocardial calcifications again identified. No pericardial effusion.    ??   VASCULATURE: Aorta is normal in caliber. Main pulmonary artery is normal in size.   ??   CHEST WALL AND BONES: No chest wall abnormalities. Normal visualized thoracic skeleton.    ??   UPPER ABDOMEN: Imaged upper abdomen is normal.   ??   OTHER: No actionable thyroid nodule.   ??   DEVICES: None.   IMPRESSION:  *Persistent but improved right upper lobe airspace disease and residual foci of groundglass opacification in the right middle and left lower lobe which may be due to resolving infection/inflammation.   *Moderate upper lung predominant centrilobular emphysema.   *Left ventricular myocardial calcifications.   ??       Additional Studies:   (03/13/21) EKG QTcF 427 ms

## 2021-10-19 ENCOUNTER — Ambulatory Visit
Admit: 2021-10-19 | Discharge: 2021-10-20 | Discharge status: Against Medical Advice | Payer: MEDICAID | Admitting: Hematology & Oncology

## 2021-10-19 ENCOUNTER — Encounter
Admit: 2021-10-19 | Discharge: 2021-10-20 | Discharge status: Against Medical Advice | Payer: MEDICAID | Admitting: Hematology & Oncology

## 2021-10-19 LAB — CBC W/ AUTO DIFF
BASOPHILS ABSOLUTE COUNT: 0.1 10*9/L (ref 0.0–0.1)
BASOPHILS RELATIVE PERCENT: 1.1 %
EOSINOPHILS ABSOLUTE COUNT: 0 10*9/L (ref 0.0–0.5)
EOSINOPHILS RELATIVE PERCENT: 0.6 %
HEMATOCRIT: 49.5 % — ABNORMAL HIGH (ref 39.0–48.0)
HEMOGLOBIN: 17.3 g/dL — ABNORMAL HIGH (ref 12.9–16.5)
LYMPHOCYTES ABSOLUTE COUNT: 1.1 10*9/L (ref 1.1–3.6)
LYMPHOCYTES RELATIVE PERCENT: 14.2 %
MEAN CORPUSCULAR HEMOGLOBIN CONC: 34.9 g/dL (ref 32.0–36.0)
MEAN CORPUSCULAR HEMOGLOBIN: 29.6 pg (ref 25.9–32.4)
MEAN CORPUSCULAR VOLUME: 84.8 fL (ref 77.6–95.7)
MEAN PLATELET VOLUME: 9.1 fL (ref 6.8–10.7)
MONOCYTES ABSOLUTE COUNT: 0.9 10*9/L — ABNORMAL HIGH (ref 0.3–0.8)
MONOCYTES RELATIVE PERCENT: 11 %
NEUTROPHILS ABSOLUTE COUNT: 5.7 10*9/L (ref 1.8–7.8)
NEUTROPHILS RELATIVE PERCENT: 73.1 %
PLATELET COUNT: 133 10*9/L — ABNORMAL LOW (ref 150–450)
RED BLOOD CELL COUNT: 5.84 10*12/L — ABNORMAL HIGH (ref 4.26–5.60)
RED CELL DISTRIBUTION WIDTH: 15.9 % — ABNORMAL HIGH (ref 12.2–15.2)
WBC ADJUSTED: 7.9 10*9/L (ref 3.6–11.2)

## 2021-10-19 LAB — URINALYSIS WITH MICROSCOPY WITH CULTURE REFLEX
BACTERIA: NONE SEEN /HPF
BILIRUBIN UA: NEGATIVE
BLOOD UA: NEGATIVE
GLUCOSE UA: NEGATIVE
KETONES UA: NEGATIVE
LEUKOCYTE ESTERASE UA: NEGATIVE
NITRITE UA: NEGATIVE
PH UA: 6 (ref 5.0–9.0)
PROTEIN UA: 70 — AB
RBC UA: <1 /HPF (ref <=3)
SPECIFIC GRAVITY UA: 1.026 (ref 1.003–1.030)
SQUAMOUS EPITHELIAL: <1 /HPF (ref 0–5)
UROBILINOGEN UA: <2
WBC UA: 1 /HPF (ref <=2)

## 2021-10-19 LAB — COMPREHENSIVE METABOLIC PANEL
ALBUMIN: 4.6 g/dL (ref 3.4–5.0)
ALKALINE PHOSPHATASE: 99 U/L (ref 46–116)
ALT (SGPT): 47 U/L (ref 10–49)
ANION GAP: 10 mmol/L (ref 5–14)
AST (SGOT): 34 U/L (ref <=34)
BILIRUBIN TOTAL: 0.4 mg/dL (ref 0.3–1.2)
BLOOD UREA NITROGEN: 15 mg/dL (ref 9–23)
BUN / CREAT RATIO: 7
CALCIUM: 9 mg/dL (ref 8.7–10.4)
CHLORIDE: 103 mmol/L (ref 98–107)
CO2: 25 mmol/L (ref 20.0–31.0)
CREATININE: 2.15 mg/dL — ABNORMAL HIGH
EGFR CKD-EPI (2021) MALE: 38 mL/min/{1.73_m2} — ABNORMAL LOW (ref >=60)
GLUCOSE RANDOM: 101 mg/dL (ref 70–179)
POTASSIUM: 4 mmol/L (ref 3.4–4.8)
PROTEIN TOTAL: 7.2 g/dL (ref 5.7–8.2)
SODIUM: 138 mmol/L (ref 135–145)

## 2021-10-19 LAB — BLOOD GAS CRITICAL CARE PANEL, VENOUS
BASE EXCESS VENOUS: 0 (ref -2.0–2.0)
CALCIUM IONIZED VENOUS (MG/DL): 4.71 mg/dL (ref 4.40–5.40)
GLUCOSE WHOLE BLOOD: 92 mg/dL (ref 70–179)
HCO3 VENOUS: 27 mmol/L (ref 22–27)
HEMOGLOBIN BLOOD GAS: 17 g/dL (ref 13.50–17.50)
LACTATE BLOOD VENOUS: 1.1 mmol/L (ref 0.5–1.8)
O2 SATURATION VENOUS: 29.5 % — ABNORMAL LOW (ref 40.0–85.0)
PCO2 VENOUS: 51 mmHg (ref 40–60)
PH VENOUS: 7.33 (ref 7.32–7.43)
PO2 VENOUS: <31 mmHg (ref 30–55)
POTASSIUM WHOLE BLOOD: 3.7 mmol/L (ref 3.4–4.6)
SODIUM WHOLE BLOOD: 136 mmol/L (ref 135–145)

## 2021-10-19 LAB — GREEN LITHIUM HEPARIN EXTRA TUBE

## 2021-10-19 LAB — HEMOGLOBIN A1C
ESTIMATED AVERAGE GLUCOSE: 91 mg/dL
HEMOGLOBIN A1C: 4.8 % (ref 4.8–5.6)

## 2021-10-19 LAB — LACTATE SEPSIS, VENOUS
LACTATE BLOOD VENOUS: 1.1 mmol/L (ref 0.5–1.8)
LACTATE BLOOD VENOUS: 1.9 mmol/L — ABNORMAL HIGH (ref 0.5–1.8)

## 2021-10-19 LAB — IGG: GAMMAGLOBULIN; IGG: 321 mg/dL — ABNORMAL LOW (ref 646–2013)

## 2021-10-19 LAB — HIGH SENSITIVITY TROPONIN I - SINGLE: HIGH SENSITIVITY TROPONIN I: 9 ng/L (ref <=53)

## 2021-10-19 MED ADMIN — sodium chloride (NS) 0.9 % flush 10 mL: 10 mL | INTRAVENOUS | @ 15:00:00 | Stop: 2021-10-19

## 2021-10-19 MED ADMIN — oxyCODONE (ROXICODONE) immediate release tablet 10 mg: 10 mg | ORAL | @ 19:00:00 | Stop: 2021-10-21

## 2021-10-19 MED ADMIN — linezolid in dextrose 5% (ZYVOX) 600 mg/300 mL IVPB 600 mg: 600 mg | INTRAVENOUS | @ 07:00:00 | Stop: 2021-10-19

## 2021-10-19 MED ADMIN — remdesivir (VEKLURY) 200 mg in sodium chloride (NS) 0.9 % 315 mL IVPB: 200 mg | INTRAVENOUS | @ 19:00:00 | Stop: 2021-10-19

## 2021-10-19 MED ADMIN — gabapentin (NEURONTIN) capsule 600 mg: 600 mg | ORAL | @ 14:00:00

## 2021-10-19 MED ADMIN — linezolid in dextrose 5% (ZYVOX) 600 mg/300 mL IVPB 600 mg: 600 mg | INTRAVENOUS | @ 20:00:00 | Stop: 2021-10-19

## 2021-10-19 MED ADMIN — aztreonam (AZACTAM) 2 g in sodium chloride 0.9 % (NS) 100 mL IVPB-connector bag: 2 g | INTRAVENOUS | @ 06:00:00 | Stop: 2021-10-19

## 2021-10-19 MED ADMIN — aztreonam (AZACTAM) 2 g in sodium chloride 0.9 % (NS) 100 mL IVPB-MBP: 2 g | INTRAVENOUS | @ 21:00:00 | Stop: 2021-10-19

## 2021-10-19 MED ADMIN — aztreonam (AZACTAM) 2 g in sodium chloride 0.9 % (NS) 100 mL IVPB-connector bag: 2 g | INTRAVENOUS | @ 14:00:00 | Stop: 2021-10-19

## 2021-10-19 MED ADMIN — umeclidinium-vilanteroL (ANORO ELLIPTA) 62.5-25 mcg/actuation inhaler 1 puff: 1 | RESPIRATORY_TRACT | @ 16:00:00

## 2021-10-19 MED ADMIN — lactated ringers bolus 2,466 mL: 30 mL/kg | INTRAVENOUS | @ 05:00:00 | Stop: 2021-10-19

## 2021-10-19 MED ADMIN — aspirin chewable tablet 81 mg: 81 mg | ORAL | @ 14:00:00

## 2021-10-19 MED ADMIN — valACYclovir (VALTREX) tablet 500 mg: 500 mg | ORAL | @ 14:00:00

## 2021-10-19 MED ADMIN — MORPhine injection 4 mg: 4 mg | INTRAVENOUS | @ 06:00:00 | Stop: 2021-10-19

## 2021-10-19 MED ADMIN — oxyCODONE (ROXICODONE) immediate release tablet 5 mg: 5 mg | ORAL | @ 12:00:00 | Stop: 2021-10-21

## 2021-10-19 NOTE — Unmapped (Signed)
Inpatient Tobacco Cessation Counseling Note    This medical encounter was conducted virtually using [email or web address]  TeleHealth protocols.    I have identified myself to the patient and conveyed my credentials to Mr. Hosie  I have explained the capabilities and limitations of telemedicine and the patient/proxy and myself both agree that it is appropriate for their current circumstances/symptoms.     Contact Information  Person Contacted: Zakry Bonebrake         Contact Phone number: [phone]      Phone Outcome: Spoke with pt  Is there someone else in the room? No.     Patient's location at the time of the telephone visit: Hospitalized at [facility]   Provider's location at the time of the telephone visit: At home, in [place]      Purpose of contact:     Pt participated in a telephone visit for tobacco cessation counseling.  Patient was admitted to hospital for COVID-19 [U07.1]. Patient consented to telephone visit given due to social isolation measures in place due to the COVID-19 pandemic.     Tobacco Use History and Assessment  Time Since Last Tobacco Use: 1 to 7 days ago  Tobacco Withdrawal (Past 24 Hours): None noted  Type of Tobacco Products Used: Cigarettes  Quantity Used: 40  Quantity Per: day  Other Household Members Use Tobacco: Yes  Smoking Allowed in Home: Yes  Medications Used in Past Attempts: Nicotine Patch (while in hospital)  Previously seen by NDP?: Hospital Inpatient    Behavioral Assessment  Why Uses: 1. habit 2. stress-relief  Reasons to Become Tobacco Free: health  Barriers/Challenges: 1. long-standing habit 2. stress 3. wife also smokes 4. cravings  Strategies: 1. pt can use NRT to manage cravings 2. pt can make home smoke-free 3. pt and wife can work to quit together 4. pt can use hand to mouth replacements    NOTE: Pt reports smoking 2ppd and states he and his wife have been discussing quitting together soon. He is motivated to become tobacco-free due to his respiratory health. Pt denies cravings and declines NRT, though is aware of available options and our dosing recommendations. We discussed the benefit of using NRT post-discharge and pt will consider. He and his wife smoke inside the home/car and we reviewed the benefit of making these areas smoke-free to reduce accessibility and second-hand smoke exposure.SW provided education on how cessation aids in COVID recovery and symptom management of chronic diseases. SW provided pt with contact information, physical improvements related to tobacco cessation, and available resource (including outpt Tobacco Treatment Program at [facility] and [place] [facility]).    Treatment Plan  Please see below for medication recommendations in bold.   Cessation Meds Currently Using: None  Medications Recommended During Hospitalization: Patient declines  Outpatient/Discharge Medications Recommended: Patch 42mg , Lozenge 4mg   Patient's Plan Post Discharge/Visit: Plan to quit in next 6 months      As part of this Telephone Visit, no in-person exam was conducted.     I personally spent 12 minutes counseling the patient via telephone about tobacco cessation.  I spent an additional 10 minutes on pre- and post-visit activities.      The patient was physically located in [place] or a state in which I am permitted to provide care. The patient and/or parent/guardian understood that s/he may incur co-pays and cost sharing, and agreed to the telemedicine visit. The visit was reasonable and appropriate under the circumstances given the patient's  presentation at the time.     The patient and/or parent/guardian has been advised of the potential risks and limitations of this mode of treatment (including, but not limited to, the absence of in-person examination) and has agreed to be treated using telemedicine. The patient's/patient's family's questions regarding telemedicine have been answered.      If the visit was completed in an ambulatory setting, the patient and/or parent/guardian has also been advised to contact their provider???s office for worsening conditions, and seek emergency medical treatment and/or call 911 if the patient deems either necessary.     Visit Format/Coding: Telephone     Coding: 16109 (11-20 minutes)  Service rendered over the phone most consistent with: Tobacco cessation counseling, greater than 10 minutes ([id number])     Sara Chu, LCSW, LCAS, NCTTP  Clinical Social Worker / Tobacco Treatment Specialist  Tobacco Treatment Program  [facility]  phone: [phone]  pager: [phone]

## 2021-10-19 NOTE — Unmapped (Signed)
Hematology Resident (MEDE) History & Physical    Assessment & Plan:   Adam Keith is a 41 y.o. 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 *        Active Problems    Dyspnea, fever, COVID+- concern for overlying bacterial pneumonia  Presenting with upper respiratory infection symptoms, fever and myalgias. Found to be COVID-positive, currently has no supplemental oxygen requirements.  Suspect patient's presentation is most consistent with his COVID infection.  Patient has history of hypoxemic respiratory failure secondary to influenza and MRSA/Klebsiella pneumonia in 12/12 requiring mechanical ventilation, given this history, ALL  and underlying COPD his risk for acute decompensation is high, hence need for admission for close monitoring during this acute infection.  I have low suspicion for abdominal source of infection or pyelonephritis, he denies having any urinary symptoms, urinalysis pending.   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  -Follow-up repeat BMP, UA    Ph+ B-ALL  Follows with Dr. Senaida Ores.  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    Checklist:  Diet: Regular Diet  DVT PPx: Lovenox 40mg  q24h  Code Status: Full Code  Dispo: Patient appropriate for Inpatient based on expectation of ongoing need for hospitalization greater than two midnights based on severity of presentation/services including likelihood of acute decompensation in setting of COVID infection    Team Contact Information:   Primary Team: Hematology Resident (MEDE)    Chief Concern:   COVID-19      Subjective:   Adam Keith is a 41 y.o. 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       HPI:    Patient is presenting for 2-day history of shortness of breath, coughing, congestion, arthralgias and myalgias.  Reports that 2 days prior to presentation (Sunday), he woke up feeling sickly, checked his temperature which was 100.2F, he took a Tylenol and went back to sleep but has not felt better since then.  He endorses bilateral flank pain that he describes as kidney pain.  He denies any nausea, vomiting, diarrhea or abdominal pain.  Patient also reports his wife was having upper respiratory symptoms starting Friday/Saturday.    On presentation to the ED, patient was afebrile with stable vital signs, was found to be COVID-positive.  Chest x-ray with hazy airspace opacities in the right lower lobe likely corresponding to consolidative airspace disease seen on prior imaging however patient does have extensive history of MDR pneumonia leading to intubation on prior hospitalization so he was started on sepsis fluids and antibiotics for concern for superimposed bacterial pneumonia.      Oncology History Overview Note   Referring/Local Oncologist: None    Diagnosis:Ph+ ALL    Genetics:    Karyotype/FISH:Abnormal Karyotype: 46,XY,t(9;22)(q34;q11.2)[1]/45,XY,der(7;9)(q10;q10)t(9;22)(q34;q11.2),der(22)t(9;22)[11]/46,sdl,+der(22)t(9;22)[5]/46,XY[3]     Abnormal FISH: A BCR/ABL1 interphase FISH assay shows an abnormal signal pattern in 97% of the 100 cells scored. 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  The findings support a diagnosis of leukemia and have implications for targeted therapy and for monitoring residual disease.      Molecular Genetics:  BCR-ABL1 p210 transcripts were detected at a level of 46.479 IS% ratio in bone marrow.  BCR-ABL1 p190 transcripts were detected at a level of 4 in 100,000 cells in bone marrow.    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)       01/24/2020 - 04/02/2020 Chemotherapy    IP/OP LEUKEMIA GRAAPH-2005 + RITUXIMAB < 60 YO  rituximab hypercvad (odd and even course)       02/24/2020 Remission    CR with PCR MRD+; marrow showing 70% blasts with <1% blasts, negative flow MRD, BCR-ABL p210 PCR 0.197%; CNS negative for blasts     12 /09/2019 Progression    CSF - rare blast ID'ed - IT chemo given    03/13/20 - IT chemo, CSF negative     04/16/2020 Biopsy    BM Bx with 40-50% cellularity, <1% blasts, MRD flow cytometry negative, BCR-ABL p210 transcripts 0.036%     05/28/2020 - 05/28/2020 Chemotherapy    OP AML - CNS THERAPY (INTRATHECAL CYTARABINE, INTRATHECAL METHOTREXATE, OR INTRATHECAL TRIPLE)  Select one of the following: cytarabine IT 100 mg with hydrocortisone 50 mg, cytarabine IT 40 mg with methotrexate 15 mg with hydrocortisone 50 mg, OR methotrexate IT 12 mg with hydrocortisone 50 mg       06/19/2020 -  Chemotherapy    IP/OP LEUKEMIA BLINATUMOMAB 7-DAY INFUSION (MINIMAL RESIDUAL DISEASE; WT >= 22 KG) (HOME INFUSION)      Cycles 1*-4: Blinatumomab 28 mcg/day Days 1-28 of 6-week cycle.  *Given in the inpatient setting on Days 1-3 on Cycle 1 and Days 1-2 on  Cycle 2, while other treatment days are given in the outpatient setting.    Cycle 1 MRD Dosing     07/18/2020 Adverse Reaction    Hospitalization: fevers. Pneumonia     07/23/2020 Biopsy    Diagnosis  Bone marrow, right iliac, aspiration and biopsy  -   Normocellular bone marrow (50%) with trilineage hematopoiesis and 1% blasts by manual aspirate differential  -   Flow cytometry MRD analysis reveals no definitive immunophenotypic evidence of residual B lymphoblastic leukemia   - BCR-ABL p210 0.006%         08/10/2020 -  Chemotherapy    Cycle 2 blinatumomab-ponatinib  Ponatinib 30mg     1 IT per cycle     09/21/2020 Adverse Reaction    Covid-19, symptomatic but not requiring hospitalization.     10/06/2020 -  Chemotherapy    Cycle 3 blinatumomab-ponatinib  Ponatinib 30mg        11/01/2020 Adverse Reaction    Hospitalization: MRSA Pneumonia requiring intubation    Blinatumomab stopped.  Ponatinib held until he stabilized.       12/14/2020 -  Chemotherapy    Cycle 4 blinatumomab 28 mcg/day days 1-28, ponatinib 30 mg per day IT triple therapy day 29     01/13/2021 -  Chemotherapy    Ponatinib 30mg  daily  - due to due low level BCR-ABL     03/06/2021 Adverse Reaction    Hospitalization - ICU on ventilator - influenza and MRSA/klebsiella PNA     ALL (acute lymphoblastic leukemia) (CMS-HCC)   06/11/2020 -  Chemotherapy    IP/OP LEUKEMIA BLINATUMOMAB 7-DAY INFUSION (MINIMAL RESIDUAL DISEASE; WT >= 22 KG) (HOME INFUSION)  Cycles 1*-4: Blinatumomab 28 mcg/day Days 1-28 of 6-week cycle.  *Given in the inpatient setting on Days 1-3 on Cycle 1 and Days 1-2 on Cycle 2, while other treatment days are given in the outpatient setting.       09/21/2020 Initial Diagnosis    ALL (acute lymphoblastic leukemia) (CMS-HCC)       Acute lymphoblastic leukemia in remission (CMS-HCC)   06/11/2020 -  Chemotherapy    IP/OP LEUKEMIA BLINATUMOMAB 7-DAY INFUSION (MINIMAL RESIDUAL DISEASE; WT >= 22 KG) (HOME INFUSION)  Cycles 1*-4: Blinatumomab 28 mcg/day Days 1-28 of 6-week cycle.  *Given in the inpatient setting on Days 1-3 on Cycle 1 and Days 1-2 on Cycle 2, while other treatment days are given in the outpatient setting.       11/09/2020 Initial Diagnosis    Acute lymphoblastic leukemia in remission (CMS-HCC)           Pertinent Surgical Hx  None    Pertinent Family Hx  None    Pertinent Social Hx   Patient lives at home with spouse  Actively smoking, reports 1-1/2 pack a day, has been smoking since he was 41 years old, interested in nicotine replacement therapy as needed while admitted    Allergies  Bupropion hcl, Cefepime, Ceftaroline fosamil, Dapsone, Onion, Vancomycin analogues, Bismuth subsalicylate, Privigen [immun glob g(igg)-pro-iga 0-50], and Furosemide    I reviewed the Medication List. The current list is Accurate    Designated Healthcare Decision Maker:  Mr. Castiglia currently has decisional capacity for healthcare decision-making and is able to designate a surrogate healthcare decision maker. Mr. Michal designated healthcare decision maker(s) is/are [name] (the patient's spouse) as denoted by stated patient preference.    Objective:   Physical Exam:  Temp:  [36.7 ??C (98 ??F)-37.3 ??C (99.1 ??F)] 37.3 ??C (99.1 ??F)  Heart  Rate:  [79-114] 79  Resp:  [19-22] 22  BP: (149-159)/(101-112) 149/101  SpO2:  [92 %-99 %] 98 %    Gen: NAD, converses   Eyes: Sclera anicteric, EOMI grossly normal   HENT: Atraumatic, normocephalic  Neck: Trachea midline  Heart: RRR  Lungs: CTAB, faint crackles, no wheezes  Abdomen: Soft, NTND  Extremities: No edema  Neuro: Grossly symmetric, non-focal    Skin:  No rashes, lesions on clothed exam  Psych: Alert, oriented

## 2021-10-19 NOTE — Unmapped (Signed)
Patient here with fevers ranging from 99-104.0. Currently on chemo. Last tylenol more than 4 hours ago

## 2021-10-19 NOTE — Unmapped (Signed)
Adult Nutrition   Note    Visit Type:    Reason for Visit:        HPI & PMH:  Adam Keith is a 41 y.o. 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   Past Medical History:   Diagnosis Date   ??? 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         Anthropometric Data:  Height:   188cm  Admission weight:  pending  Last recorded weight:  124.7kg  IBW:  86.3kg  Percent IBW:  145%  BMI: 35  Usual Body Weight: Unable to obtain at this time     Weight history prior to admission: 10% increase since 07/14/21; 20% increase since 04/23/21-significant   Wt Readings from Last 10 Encounters:   10/11/21 (!) 124.7 kg (275 lb)   08/31/21 (!) 118.8 kg (261 lb 14.5 oz)   07/14/21 (!) 113.4 kg (250 lb)   07/08/21 (!) 113.4 kg (250 lb)   06/24/21 (!) 117.2 kg (258 lb 6.1 oz)   05/24/21 (!) 107.4 kg (236 lb 12.8 oz)   05/04/21 (!) 105 kg (231 lb 8 oz)   04/27/21 (!) 103.7 kg (228 lb 9.9 oz)   04/23/21 (!) 104.3 kg (229 lb 14.4 oz)   04/12/21 (!) 102.8 kg (226 lb 10.1 oz)        Weight changes this admission: There were no vitals filed for this visit.     Nutrition Focused Physical Exam:             Nutrition Evaluation  Overall Impressions: Nutrition-Focused Physical Exam not indicated due to lack of malnutrition risk factors. (10/19/21 1143)  Nutrition Designation: Obese class II  (BMI 35.00 - 39.99 kg/m2) (10/19/21 1143)         NUTRITIONALLY RELEVANT DATA     Medications:   Nutritionally pertinent medications reviewed and evaluated for potential food and/or medication interactions.     Labs:   Nutritionally pertinent labs reviewed.    Lab Results   Component Value Date    NA 136 10/19/2021    K 3.7 10/19/2021    CL 103 10/18/2021    CO2 25.0 10/18/2021    BUN 15 10/18/2021 CREATININE 2.15 (H) 10/18/2021    GLU 101 10/18/2021    CALCIUM 9.0 10/18/2021    ALBUMIN 4.6 10/18/2021    PHOS 3.5 03/20/2021         Nutrition History:   October 19, 2021: Prior to admission: Phone Visit-Pt reports decreased oral intake for 2 days with lack of taste and breathing. He is finding sweeter foods more palatable, like chocolate, brown sugar pop starts.     Allergies, Intolerances, Sensitivities, and/or Cultural/Religious Dietary Restrictions: include onion    Current Nutrition:  Oral intake          Nutritional Needs:   Healthy balance of carbohydrate, protein, and fat.       Malnutrition Assessment using AND/ASPEN Clinical Characteristics:    Patient does not meet AND/ASPEN criteria for malnutrition at this time (10/19/21 1144)       GOALS and EVALUATION     ??? Patient to consume 75% or greater of po intake via combination of meals, snacks, and/or oral supplements within 1-2 weeks.  - New    Motivation,  Barriers, and Compliance:  Evaluation of motivation, barriers, and compliance completed. No concerns identified at this time.     NUTRITION ASSESSMENT     ??? Pt with significant weight increases. No noted edema. Will continue monitor  ??? Pt new start oral decline. Sense of smell likely impacted by confirmed covid infection.  Pt will need assistance in maintaining stable nutrition status.       Discharge Planning:   Monitor via CAPP rounds for any discharge planning needs.  Monitor for potential discharge needs with multi-disciplinary team.       NUTRITION INTERVENTIONS and RECOMMENDATION     1. Added choc ensure plus high prot TID  2. Reviewed flavoring of water w/ juice or available crystal light packets  3. Reviewed flavoring foods, sweeter items/condiments to blander foods      Follow-Up Parameters:   1-2 times per 4 week period (and more frequent as indicated)    Ed Blalock, MS, RD, CSO, LDN  Pager # [phone]

## 2021-10-19 NOTE — Unmapped (Addendum)
The patient developed a reaction to one of his medications at the start of my shift around 7 PM.  He had slightly darker discoloration of his arms, and a red rash around his waist back and chest that were all new.  He reported his breathing was fine and was without nausea, vomiting or any abdominal pain.  He did have new left-sided pain around his temple and described sensation of echoing and tinnitus in his left ear that was similar to symptoms he had in his right ear before having hearing loss then.  I went to assess the patient in the room.    The pain was severe at onset, occurred 15 to 20 minutes before my assessment and it was not clear what triggered it.  He had never had these symptoms before in this year and he does not get anything like headaches.  When asked if anything for similar pain like this is helped him before his wife got quite upset and explained that he has never had pain like this before.  He had not tried anything to relieve the symptoms yet.  Prior to assessing I had ordered a one-time dose of Benadryl for suspected medication reaction based on rash above.  In the room, I explained that I was not sure what was causing patient's headache and my plan to trial Tylenol and a dose of Benadryl to see if that alleviates his symptoms and he was initially agreeable to this plan.    Several minutes later I received notification from his nursing team that the patient wished to discharge immediately.  I again went to his room to assess the patient and inquire about this.  He explained that he felt he was being treated very poorly and that none of the medications to treat this reaction had arrived yet.  I apologize for this inconvenience explained that we are trying to get the medications as fast as we could.  He was also upset that he was not able to use his home Dilaudid.  I explained that he had already been ordered a as needed order for oxycodone, and that I would happily exchange this order for Dilaudid instead.  However the patient was adamant he wanted to leave.    I asked him if he understood the risks of this and he explained to me that he knew he could have worsening fever.  I also explained that his symptoms could include fever, worsening shortness of breath and he could even have worsening complications including worsening of his infection and possibilities as serious as death.  He verbally acknowledged understanding of this to me and explained that he still wanted to leave.  His rationale was that he believed he could be treated better elsewhere.    I explained to him that he should come back to [facility] or another ED if he develops fever or any worsening symptoms right away.  He acknowledged and agreed to do this and also explained that he had close follow-up with his oncology team and infectious disease team and was even able to get in contact with them tonight as soon as tomorrow morning.  I offered to prescribe antibiotics and then on further review and with discussion with our pharmacist decided the best course was to discontinue his home prophylactic doxycycline 100 mg twice daily.  I called the patient without response and then his wife who picked up and did say they had enough refills of the doxycycline and understood they should continue that  medication twice a day 100 mg each dose.     The patient presented with URI symptoms, fevers, myalgias felt to be mostly secondary to COVID infection.  And was noted to have a background of moderate to severe lung disease and deemed high risk for severe COVID-pneumonia.  IC ID was consulted during the hospitalization and advised discontinuation of antibacterials with lower concern for superimposed bacterial infection.  He received a loading dose of remdesivir before departing tonight.  Broad infectious work-up was collected and pending at discharge.  The patient reported close follow-up with his infectious disease team and the primary team will be notified 7/19 for consideration of further COVID therapeutics to prevent disease progression.  His ponatinib was held on admission in setting of infection possibly for increased risk of thrombosis and was also stopped on discharge.  His primary oncologist was notified of discharge AGAINST MEDICAL ADVICE.

## 2021-10-19 NOTE — Unmapped (Signed)
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        Assessment/Recommendations:    Adam Keith is a 41 y.o. 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 krusei fungemia  - Prior chemotherapy: [id number]-2005 induction with dasatinib (vincristine, dexamethasone and dasatinib); C1D1 01/24/2020; difficulty tolerating single-agent dasatinib  - Current chemotherapy: blinatumomab (anti-CD19/CD3) + ponatinib (TKI) s/p C4 on 12/14/20; 01/2021 now on ponatinib monotherapy maintenance  - 01/11/21: Normocellular bone marrow (30% overall) with trilineage hematopoiesis and less than 1% blasts by manual aspirate differential; Flow cytometry MRD analysis reveals no definitive immunophenotypic evidence of residual B lymphoblastic leukemia; BCR-ABL p210 transcripts were detected at a level of 0.002 IS % ratio in bone marrow.  -06/24/21: Peripheral blood - lack of detectable BCR-ABL1 p210 RNA suggests molecular remission of leukemia     # Hypogammaglobulimia  - 11/01/20 IgG 266 declined IVIG  - 03/11/21 IgG 148 - IVIG infusion stopped as patient developed rigors, tachycardia and HTN during infusion despite pre-medication   - 03/12/21 IgG 213 - completed IVIG with premedications and slower rate of infusion  -03/17/21 IgG 632  -06/24/21 IgG 292 - IVIG infusion stopped as patient did not tolerate despite pre-medication  -10/19/21 IgG 321     # Mild CKD  CrCl cannot be calculated (Unknown ideal weight.).     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 Juliene Pina felt chest pain is appropriate in setting of medically managed empyema and risk of thoracotomy outweighed benefit given clinical symptoms  - 03/05/21 MRSA pneumonia after influenza infection requiring ICU admission/intubation  - 12/11 CT Chest : Multifocal consolidative airspace disease, nonspecific small pleural effusion in the dependent aspect of the left pleural space and trace loculated collections within the lateral aspect of the right chest.  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 Patient admitted with sepsis 2/2 COVID 19 infection. He doesn't have any S&S to suggest a superimposed bacterial infection that warrant antimicrobial therapy.     Management  ??? Discontinue aztreonam and linezolid.   ??? Continue remdesivir for COVID 19 per protocol.   ??? Patient has not tolerated IVIG infusions well in the past. Recommend alternatives such as s/c or Allergy input.    Antimicrobial prophylaxis required for host deficiency: hematological malignancy  ??? Resume PTA oral doxycycline 100mg  BID for recurrent MRSA pneumonia/ empyema  ??? Continue PTA oral valacyclovir 500mg  daily     Intensive toxicity monitoring for prescription antimicrobials   ??? CBC w/diff at least TWICE per week  ??? CMP at least TWICE per week  ??? clinical assessments for rashes or other skin changes    The [facility] ID service will continue to follow.           Please page the ID Transplant/Liquid Oncology Fellow consult at [phone] with questions.  Patient discussed with Dr. Leanord Hawking.    Adam Budd, MD  [facility] Division of Infectious Diseases    History of Present Illness:      External record(s): Primary team note: fevers.    Independent historian(s): no independent historian required.       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     Of note, patient has a hx of recurrent MRSA pneumonias/bactremia/ empyema and has been on chronic suppressive therapy with doxycyline since Dec 2022. He was last seen in ID clinic on 04/23/21. Since then, developed Influenza A on 05/04/21, which was treated outpatient with Tamiflu. CT chest on 05/17/21, showed persistent but improved RUL airspace disease and residual foci of GGO in the RML and LLL. He was seen by Pulm in Feb 2023 and started on [place]. Repeat CT chest on 09/22/21 shows continued improvement, but not resolution, of consolidative airspace disease in the upper lobe of the right lung and small irregular opacities within the in the lower lobe of the left lung that remains from similar airspace disease.     Of note, there have been ongoing plans/ discussion about proceeding with an allogeneic stem cell transplant. Patient, however, has been reluctant and wanted to discuss with [place] (ID) prior to deciding whether he would like to pursue the transplant. In the interim, he had recurrent nasal congestion and most recently underwent CT M/F c/f L maxillary sinusitis. He was seen by Otolaryngology and underwent b/l flexible nasal endoscopy on 10/11/21. Found to have L posterior and R anterior septal deviation with no polyps or gross purulence encountered. ENT recommended septoplasty, b/l FESS with L medial maxillectomy.       Patient was supposed to be seen in ID clinic on 10/19/21, however, he presented with SOB, fevers, myalgias cough on 10/18/21 to the ER. Patient's spouse reportedly had similar symptoms. Patient also reported b/l pain in his flanks. Denied any N/V/ CP, dysuria.  labs showed normal WBC count 7/9, plt 133; Cr 2.15. CXR showed Hazy airspace opacities in the right upper lobe, likely corresponding to consolidative airspace disease seen on prior CT chest. HE tested positive for SARsCoV2. He was started on IV aztreonam and linezolid. ID consulted for concern of sepsis. Of note, blood cx have been neg to date. Patient also on remdesivir for COVID 19 infection.     When seen this afternoon, he was laying in bed. Wife at bedside. Patient reports feeling miserable and states that he developed a rash on hsi torso and extremities. He reports cough, and SOB. Of note he is on RA. He denies any headaches, N/V/D, abdominal pain, dysuria. He states that he has been taking doxycycline since we last saw him in clinic. Has had some issues with photosensitivity.     Allergies:  Allergies   Allergen Reactions   ??? Bupropion Hcl Other (See Comments)     Per patient out of touch with reality, suicidal, homicidal   ??? Cefepime Rash     DRESS   ??? Ceftaroline Fosamil Rash and Other (See Comments)     Rash X 2 02/2020, suspected DRESS 06/2020   ??? 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 Onion Anaphylaxis   ??? Vancomycin Analogues      Hearing loss with Lasix  Other reaction(s): Other (See Comments)  Hearing loss  lasix   ??? Bismuth Subsalicylate Nausea And Vomiting   ??? Privigen [Immun Glob G(Igg)-Pro-Iga 0-50] Other (See Comments)     03/10/2021 VIG infusion stopped as patient developed rigors, tachycardia and HTN during infusion despite pre-medication; 03/12/21 completed IVIG with premedications and slower rate of infusion   ??? Furosemide      With Vancomycin caused hearing loss  Other reaction(s): Other (See Comments)  With Vancomycin caused hearing loss       Medications:   Antimicrobials:  Anti-infectives (From admission, onward)      Start     Dose/Rate Route Frequency Ordered Stop    10/20/21 0900  remdesivir (VEKLURY) 100 mg in sodium chloride (NS) 0.9 % 295 mL IVPB        Question Answer Comment   Lab confirmed SARS-CoV-2 infection? Yes    Does patient have symptoms consistent with SARS-CoV-2 infection? Yes    Hospitalization less than or equal to 10 days for COVID-19? Yes       See Hyperspace for full Linked Orders Report.    100 mg  590 mL/hr over 30 Minutes Intravenous Daily (standard) 10/19/21 0272 10/24/21 0859    10/19/21 1400  linezolid in dextrose 5% (ZYVOX) 600 mg/300 mL IVPB 600 mg         600 mg  300 mL/hr over 60 Minutes Intravenous Every 12 hours scheduled 10/19/21 0918 10/22/21 0859    10/19/21 1000 aztreonam (AZACTAM) 2 g in sodium chloride 0.9 % (NS) 100 mL IVPB-connector bag         2 g  300 mL/hr over 20 Minutes Intravenous Every 8 hours 10/19/21 0918 10/22/21 0959    10/19/21 0900  valACYclovir (VALTREX) tablet 500 mg         500 mg Oral Daily (standard) 10/19/21 0624      10/19/21 0800  remdesivir (VEKLURY) 200 mg in sodium chloride (NS) 0.9 % 315 mL IVPB        Question Answer Comment   Lab confirmed SARS-CoV-2 infection? Yes    Does patient have  symptoms consistent with SARS-CoV-2 infection? Yes    Hospitalization less than or equal to 10 days for COVID-19? Yes       See Hyperspace for full Linked Orders Report.    200 mg  630 mL/hr over 30 Minutes Intravenous Once 10/19/21 1610              Current/Prior immunomodulators per problem list. No change since admission.    Other medications reviewed.     Past Medical History:   Diagnosis Date   ??? 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 BONE MARROW BIOPSY & ASPIRATION  01/21/2020        ??? CHG [place], CHEST,REAL TIME  11/20/2020    Procedure: ULTRASOUND, CHEST, REAL TIME WITH IMAGE DOCUMENTATION;  Surgeon: Jerelyn Charles, MD;  Location: [facility] PROCED[facility];  Service: Pulmonary   ??? IR INSERT PORT AGE GREATER THAN 5 YRS  03/10/2020    IR INSERT PORT AGE GREATER THAN 5 YRS 03/10/2020 [name], MD IMG VIR [facility]   ??? PR BRONCHOSCOPY,DIAGNOSTIC W LAVAGE Bilateral 07/20/2020    Procedure: BRONCHOSCOPY, RIGID OR FLEXIBLE, INCLUDE FLUOROSCOPIC GUIDANCE WHEN PERFORMED; W/BRONCHIAL ALVEOLAR LAVAGE WITH MODERATE SEDATION;  Surgeon: Dellis Filbert, MD;  Location: [facility] PROCEDURE LAB [facility];  Service: Pulmonary     Denies prior surgeries with retained hardware    Social History:  Tobacco use:   reports that he has been smoking cigarettes. He has been smoking an average of 2 packs per day. He has been exposed to tobacco smoke. He has quit using smokeless tobacco.  His smokeless tobacco use included chew.   Alcohol use:    reports that he does not currently use alcohol.   Drug use:    has no history on file for drug use.   Living situation:  Lives with family   Residence:   [place], [place]   Employment:     Pets and animal exposure:  Animal exposures include dog     Family History:  Family History   Problem Relation Age of Onset   ??? Melanoma Neg Hx    ??? Basal cell carcinoma Neg Hx    ??? Squamous cell carcinoma Neg Hx             Vital Signs last 24 hours:  Temp:  [36.7 ??C (98 ??F)-37.3 ??C (99.1 ??F)] 36.7 ??C (98 ??F)  Heart Rate:  [79-114] 87  Resp:  [19-22] 22  BP: (149-161)/(88-112) 161/88  MAP (mmHg):  [114-126] 114  SpO2:  [92 %-99 %] 96 %    Physical Exam:  Patient Lines/Drains/Airways Status       Active Active Lines, Drains, & Airways       Name Placement date Placement time Site Days    Peripheral IV 10/18/21 Left Antecubital 10/18/21  2357  Antecubital  less than 1                  Const [x]  vital signs above    [x]  NAD, non-toxic appearance []  Chronically ill-appearing, non-distressed  Laying in bed; on RA      Eyes [x]  Lids normal bilaterally, conjunctiva anicteric and noninjected OU     [] PERRL  [] EOMI        ENMT []  Normal appearance of external nose and ears, no nasal discharge        [  x] MMM, no lesions on lips or gums [x]  No thrush, leukoplakia, oral lesions  []  Dentition good []  Edentulous []  Dental caries present  [x]  Hearing normal  []  TMs with good light reflexes bilaterally         Neck []  Neck of normal appearance and trachea midline        []  No thyromegaly, nodules, or tenderness   []  Full neck ROM        Lymph [x]  No LAD in neck     []  No LAD in supraclavicular area     []  No LAD in axillae   []  No LAD in epitrochlear chains     []  No LAD in inguinal areas        CV [x]  RRR            [x]  No peripheral edema     []  Pedal pulses intact   [x]  No abnormal heart sounds appreciated   []  Extremities WWP         Resp [x]  Normal WOB at rest    [x]  No breathlessness with speaking, no coughing  [x]  CTA anteriorly    []  CTA posteriorly          GI [x]  Normal inspection, NTND   [x]  NABS     []  No umbilical hernia on exam       []  No hepatosplenomegaly     []  Inspection of perineal and perianal areas normal        GU []  Normal external genitalia     [x] No urinary catheter present in urethra   []  No CVA tenderness    []  No tenderness over renal allograft        MSK []  No clubbing or cyanosis of hands       []  No vertebral point tenderness  []  No focal tenderness or abnormalities on palpation of joints in RUE, LUE, RLE, or LLE  Trace edema in LE's b/l      Skin []  No rashes, lesions, or ulcers of visualized skin     [x]  Skin warm and dry to palpation   Erythematous pacthy rash on RUQ; torso      Neuro [x]  Face expression symmetric  [x]  Sensation to light touch grossly intact throughout    [x]  Moves extremities equally    [x]  No tremor noted        []  CNs II-XII grossly intact     []  DTRs normal and symmetric throughout []  Gait unremarkable        Psych [x]  Appropriate affect       [x]  Fluent speech         [x]  Attentive, good eye contact  [x]  Oriented to person, place, time          [x]  Judgment and insight are appropriate           Data for Medical Decision Making     (10/19/21) EKG QTcF    I discussed mgm't w/qualified health care professional(s) involved in case: priamry team  to d/c aztrreonam and linezolid; IVIG eithe s/c or get Allergy input.    I reviewed CBC results (WBC WNL), chemistry results (Cr 2.15), micro result(s) (SARSCoV2 pos) and radiology report(s) (CXR - unchanged from prior).    I independently visualized/interpreted plain film images (CXR - appears unchanged ).       Recent Labs   Lab Units 10/19/21  0105 10/18/21  2359   WBC 10*9/L  --  7.9  HEMOGLOBIN g/dL  --  57.8*   PLATELET COUNT (1) 10*9/L  --  133*   NEUTRO ABS 10*9/L  --  5.7   LYMPHO ABS 10*9/L  --  1.1   EOSINO ABS 10*9/L  --  0.0   SODIUM WHOLE BLOOD mmol/L 136  --    SODIUM mmol/L  --  138   POTASSIUM WHOLE BLOOD mmol/L 3.7  --    POTASSIUM mmol/L  --  4.0   BUN mg/dL  --  15   CREATININE mg/dL  --  4.69*   GLUCOSE mg/dL  --  629   CALCIUM mg/dL  --  9.0   BILIRUBIN TOTAL mg/dL  --  0.4   AST U/L  --  34   ALT U/L  --  47       Microbiology:  Microbiology Results (last day)       Procedure Component Value Date/Time Date/Time    Lower Respiratory Culture [[id number]]     Lab Status: No result Specimen: SPUTUM EXPECTORATED     Blood Culture [[id number]] Collected: 10/19/21 0105    Lab Status: In process Specimen: Blood from 1 Peripheral Draw Updated: 10/19/21 0112    Blood Culture [[id number]] Collected: 10/19/21 0105    Lab Status: In process Specimen: Blood from 1 Peripheral Draw Updated: 10/19/21 0112    RAPID Influenza /RSV/COVID PCR [[id number]]  (Abnormal) Collected: 10/18/21 2359    Lab Status: Final result Specimen: Nasopharyngeal Swab Updated: 10/19/21 0059     SARS-CoV-2 PCR Positive     Influenza A Negative     Influenza B Negative     RSV Negative     SARS-CoV-2 CT Value 20.6     Comment: SARS-CoV-2 cycle numbers (Ct values) should not be used to guide clinical care without infectious disease consultation. Cycle numbers are provided solely for use by infectious diseases. Variation between testing platforms, laboratories, and sampling techniques limits comparison across tests. Validated clinical algorithms using cycle numbers are not currently available.       Narrative:      This test was performed using the Cepheid Xpert Xpress CoV-2/Flu/RSV plus assay, which has been validated by the CLIA-certified, CAP-inspected [employer] FDA has granted Emergency Use Authorization for this test. Negative results do not preclude infection and should be interpreted along with clinical observations, patient history, and epidemiological information. Information for providers and patients can be found here: [email or web address]            Imaging:  ECG 12 Lead    Result Date: 10/19/2021  NORMAL SINUS RHYTHM POSSIBLE LEFT ATRIAL ENLARGEMENT RIGHTWARD AXIS BORDERLINE ECG WHEN COMPARED WITH ECG OF 13-Mar-2021 06:24, NO SIGNIFICANT CHANGE WAS FOUND    XR Chest 2 views    Result Date: 10/19/2021  EXAM: XR CHEST 2 VIEWS DATE: 10/19/2021 3:23 AM ACCESSION: [id number] UN DICTATED: 10/19/2021 4:22 AM INTERPRETATION LOCATION: [facility]     CLINICAL INDICATION: 41 year old male with fever.      TECHNIQUE: PA and Lateral Chest Radiographs.     COMPARISON: Chest radiograph 08/27/2021, CT chest 09/22/2021.     FINDINGS:     Residual scarring in the right apex at site of prior extensive consolidation noted in December 2022 and January 2023. No acute consolidation.. No pleural effusion or pneumothorax.     Stable cardiomediastinal contour.         Residual abnormalities right apex improved since January 31 without much change since May 26. The appearance is consistent with  postinflammatory scarring as noted on CT June 21.     ==================== MODIFIED REPORT: (10/19/2021 8:48 AM) This report has been modified from its preliminary version; you may check the prior versions of radiology report, results history link for prior report versions.     -----------------------------------------------     Serologies:  Lab Results   Component Value Date    CMV IGG Negative 07/14/2021    Hep B Surface Ag Nonreactive 01/21/2020    Hep B S Ab Nonreactive 01/21/2020    Hep B Surf Ab Quant <8.00 01/21/2020    Hepatitis C Ab Nonreactive 01/21/2020    HSV 1 IgG Positive (A) 01/21/2020    HSV 2 IgG Negative 01/21/2020    Toxoplasma Gondii IgG Negative 02/11/2020       Immunizations:  Immunization History   Administered Date(s) Administered   ??? COVID-19 VACCINE,MRNA(MODERNA)(PF) 02/25/2020, 03/23/2020

## 2021-10-19 NOTE — Unmapped (Signed)
[facility]  Emergency Department Provider Note     ED Clinical Impression     Final diagnoses:   COVID-19 (Primary)      HPI, Medical Decision Making, ED Course     HPI: 41 y.o. 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  Patient states yesterday he developed fever with Tmax of 102 ??F.  Also developed cough, shortness of breath, generalized fatigue and myalgias.  Of note, patient's spouse has also had similar symptoms.  Patient also endorsing pain in his bilateral kidneys.  Patient states he has been taking Tylenol for his fever.  Denies any nausea, vomiting, chest pain, abdominal pain, dysuria, hematuria, or diarrhea.  Denies any history of blood clots or leg swelling.    Initial vitals notable for tachycardia of 114, afebrile T 98.2 ??F, BP 159/112.  Saturating 99% on room air.  Patient has mild tachypnea in the low 20s.  On lung exam, patient has some decreased breath sounds on the right upper lobe with scattered expiratory wheezes.  Abdomen soft, nontender, nondistended.  No lower extremity edema.  No nuchal rigidity.  No rash.    DDx/MDM: Initial differential includes but is not limited to pneumonia, viral URI, UTI, pyelonephritis, bacteremia, amongst multiple other etiologies.  Given the patient is immunocompromise and history of MRSA bacteremia and pneumonia, will plan for sepsis work-up including blood cultures, lactate, labs, viral swabs, chest x-ray, and UA.  No concern for meningitis at this time.  Considered doing a CTA chest to evaluate for PE however lower suspicion given his fever, cough, and myalgia which seem more consistent with infection.    Diagnostic workup as below. Will treat patient with 30 cc/kg IV fluid bolus as well as broad-spectrum antibiotics including aztreonam and linezolid due to his previous dress reaction from cefepime as well as ototoxicity from vancomycin.  Patient without any abdominal tenderness or symptoms concerning for intra-abdominal infection so do not feel like we need Flagyl at this time.    Orders Placed This Encounter   Procedures    RAPID Influenza /RSV/COVID PCR    Blood Culture    Blood Culture    XR Chest 2 views    ED Extra Tubes    CBC w/ Differential    Comprehensive Metabolic Panel    Blood Gas Critical Care Panel, Venous    Lactate Sepsis, Venous    Urinalysis with Microscopy with Culture Reflex    hsTroponin I (single, no delta)    Misc nursing order (Sepsis Timer)    Cardiac Monitor    Oxygen sat continuous monitoring    Notify Provider    In and Out (I & O) cath    Notify Provider    Obtain medical records    Vital signs    Notify pharmacy immediately that the ED Adult Sepsis order set has been initiated.    Adult Oxygen therapy    ECG 12 Lead    Insert peripheral IV    Insert 2nd peripheral IV    ED Admit Decision       ED Course  ED Course as of 10/19/21 0517   Tue Oct 19, 2021   0105 SARS-CoV-2 PCR(!): Positive   0106 CBC with WBC of 7.9, hemoglobin 7.3, platelets 133.   0111 CMP was normal electrolytes.  Creatinine elevated 2.15.  A month ago was 1.91.  Normal  LFTs, bilirubin, and alkaline phosphatase.   0111 hsTroponin I: 9   0115 Lactate, Venous(!): 1.9  Patient receiving IV fluids.   0115 ABG with normal pH, PCO2.   0436 Chest x-ray independently reviewed by myself and shows right hazy opacity however similar to previous chest x-ray.  However, given patient's history of MRSA bacteremia and need for intubation, will plan to admit.   [phone] Spoke with oncology team who will come and evaluate the patient for admission.       Discussion of Management with other Physicians, QHP, or Appropriate Source: Admitting team - spoke with oncology team who will come and evaluate the patient for admission  Independent Interpretation of Studies: If applicable, documented in ED Course above.  External Records Reviewed: I have reviewed recent and relevant previous record, including: Inpatient notes - reviewed discharge summary from 03/20/2021 where he was in the hospital for acute respiratory failure with hypoxia  Escalation of Care, Consideration of Admission/Observation/Transfer:  Patient to be admitted      ____________________________________________    The case was discussed with the attending physician, who is in agreement with the above assessment and plan.     Additional History Elements     Chief Complaint  Chief Complaint   Patient presents with    Fever Immunocompromised       Outside Historian(s): I have obtained additional independent history/collateral from the patient's spouse.    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       Past Surgical History:   Procedure Laterality Date    BONE MARROW BIOPSY & ASPIRATION  01/21/2020         CHG [place], CHEST,REAL TIME  11/20/2020    Procedure: ULTRASOUND, CHEST, REAL TIME WITH IMAGE DOCUMENTATION;  Surgeon: Jerelyn Charles, MD;  Location: BRON[facility] PROCEDURE LAB [facility];  Service: Pulmonary    IR INSERT PORT AGE GREATER THAN 5 YRS  03/10/2020    IR INSERT PORT AGE GREATER THAN 5 YRS 03/10/2020 [name], MD IMG VIR H&V [facility]    PR BRONCHOSCOPY,DIAGNOSTIC W LAVAGE Bilateral 07/20/2020    Procedure: BRONCHOSCOPY, RIGID OR FLEXIBLE, INCLUDE FLUOROSCOPIC GUIDANCE WHEN PERFORMED; W/BRONCHIAL ALVEOLAR LAVAGE WITH MODERATE SEDATION;  Surgeon: Dellis Filbert, MD;  Location: BRON[facility] PROCEDURE LAB [facility];  Service: Pulmonary       No current facility-administered medications for this encounter.    Current Outpatient Medications:     acetaminophen (TYLENOL) 325 MG tablet, Take 2 tablets (650 mg total) by mouth every six (6) hours as needed for pain., Disp: , Rfl:     albuterol HFA 90 mcg/actuation inhaler, Inhale 2 puffs every six (6) hours as needed for wheezing., Disp: 8 g, Rfl: 11    amLODIPine (NORVASC) 10 MG tablet, Take 1 tablet (10 mg total) by mouth daily., Disp: 30 tablet, Rfl: 0    arm brace (WRIST BRACE) Misc, 1 Piece by Miscellaneous route as needed. left ulnar wrist brace, Disp: 1 each, Rfl: 0    aspirin 81 MG chewable tablet, Chew 1 tablet (81 mg total) daily., Disp: 36 tablet, Rfl: 11    carvediloL (COREG) 12.5 MG tablet, Take 1 tablet (12.5 mg total) by mouth Two (2) times a day., Disp: 180 tablet, Rfl: 1    chlorhexidine (HIBICLENS) 4 % external liquid, Apply topically daily. as directed, Disp: 946 mL, Rfl: 0    clonazePAM (KLONOPIN) 0.5 MG tablet, Take 1 tablet (0.5  mg total) by mouth two (2) times a day as needed for anxiety or sleep., Disp: 60 tablet, Rfl: 1    doxycycline (VIBRA-TABS) 100 MG tablet, Take 1 tablet (100 mg total) by mouth Two (2) times a day., Disp: 180 tablet, Rfl: 3    gabapentin (NEURONTIN) 300 MG capsule, Take 2 capsules (600mg ) by mouth in the morning and 3 capsules (900mg ) at bedtime. May cause drowsiness., Disp: 150 capsule, Rfl: 5    HYDROmorphone (DILAUDID) 2 MG tablet, Take 1 tablet (2 mg total) by mouth two (2) times a day as needed., Disp: 56 tablet, Rfl: 0    mirtazapine (REMERON) 15 MG tablet, TAKE 1 TABLET BY MOUTH EVERY DAY AT NIGHT, Disp: 90 tablet, Rfl: 4    mucus clearing device Devi, 1 each by Miscellaneous route Two (2) times a day., Disp: 1 each, Rfl: 0    mupirocin (BACTROBAN) 2 % ointment, Apply topically daily. apply in each nostril prior to bed, as directed, Disp: 15 g, Rfl: 0    nortriptyline (PAMELOR) 50 MG capsule, Take 2 capsules (100 mg total) by mouth nightly., Disp: , Rfl:     OXYCONTIN 15 mg 12 hr crush resistant ER/CR tablet, Take 1 tablet (15 mg total) by mouth Two (2) times a day., Disp: 56 tablet, Rfl: 0    OXYCONTIN 15 mg 12 hr crush resistant ER/CR tablet, Take 1 tablet (15 mg total) by mouth Two (2) times a day., Disp: 56 tablet, Rfl: 0    PONATinib (ICLUSIG) 30 mg tablet, Take 1 tablet (30 mg total) by mouth daily. Swallow tablets whole. Do not crush, break, cut or chew tablets., Disp: 30 tablet, Rfl: 5    sulfamethoxazole-trimethoprim (BACTRIM DS) 800-160 mg per tablet, TAKE 1 TABLET BY MOUTH 2 TIMES A DAY ON SATURDAY, SUNDAY. FOR PROPHYLAXIS WHILE ON CHEMO., Disp: 48 tablet, Rfl: 3    umeclidinium-vilanteroL (ANORO ELLIPTA) 62.5-25 mcg/actuation inhaler, Inhale 1 puff daily., Disp: 60 each, Rfl: 11    valACYclovir (VALTREX) 500 MG tablet, Take 1 tablet (500 mg total) by mouth daily., Disp: 90 tablet, Rfl: 11    Allergies  Bupropion hcl, Cefepime, Ceftaroline fosamil, Dapsone, Onion, Vancomycin analogues, Bismuth subsalicylate, Privigen [immun glob g(igg)-pro-iga 0-50], and Furosemide    Family History  Family History   Problem Relation Age of Onset    Melanoma Neg Hx     Basal cell carcinoma Neg Hx     Squamous cell carcinoma Neg Hx        Social History  Social History     Tobacco Use    Smoking status: Every Day     Packs/day: 2.00     Types: Cigarettes     Last attempt to quit: 01/20/2020     Years since quitting: 1.7    Smokeless tobacco: Former     Types: Catering manager Use: Never used   Substance Use Topics    Alcohol use: Not Currently        Physical Exam     VITAL SIGNS:      Vitals:    10/19/21 0049 10/19/21 0100 10/19/21 0437 10/19/21 0440   BP: 155/102 149/101     Pulse: 100 94 79    Resp:  19 22    Temp: 36.7 ??C (98 ??F)   37.3 ??C (99.1 ??F)   TempSrc: Oral   Oral   SpO2: 94% 97% 92% 98%       Constitutional: Alert and  oriented. No acute distress.  Eyes: Conjunctivae are normal.  HEENT: Normocephalic and atraumatic. Conjunctivae clear. No congestion. Moist mucous membranes.   Cardiovascular: Rate as above, regular rhythm. Normal and symmetric distal pulses. Brisk capillary refill. Normal skin turgor.  Respiratory: Mild tachypnea to low 20s.  Patient has some decreased breath sounds on the right upper lobe with scattered expiratory wheezes.  Gastrointestinal: Soft, non-distended, non-tender.  Genitourinary: Deferred.  Musculoskeletal: Non-tender with normal range of motion in all extremities.  No lower extremity edema.  Neurologic: Normal speech and language. No gross focal neurologic deficits are appreciated. Patient is moving all extremities equally, face is symmetric at rest and with speech.  Skin: Skin is warm, dry and intact. No rash noted.  Psychiatric: Mood and affect are normal. Speech and behavior are normal.     Radiology     XR Chest 2 views   Preliminary Result   Hazy airspace opacities in the right upper lobe, likely corresponding to consolidative airspace disease seen on prior CT chest.          Pertinent labs & imaging results that were available during my care of the patient were independently interpreted by me and considered in my medical decision making (see chart for details).    Portions of this record have been created using Scientist, clinical (histocompatibility and immunogenetics). Dictation errors have been sought, but may not have been identified and corrected.        Francis Dowse, MD  Resident  10/19/21 [phone]

## 2021-10-20 DIAGNOSIS — C9101 Acute lymphoblastic leukemia, in remission: Principal | ICD-10-CM

## 2021-10-20 DIAGNOSIS — U071 COVID-19: Principal | ICD-10-CM

## 2021-10-20 MED ORDER — NIRMATRELVIR 300 MG (150 MG X2)-RITONAVIR 100 MG TABLET,DOSE PACK
ORAL_TABLET | 0 refills | 0.00000 days | Status: CP
Start: 2021-10-20 — End: 2021-10-20

## 2021-10-20 MED FILL — ICLUSIG 30 MG TABLET: ORAL | 30 days supply | Qty: 30 | Fill #4

## 2021-10-20 NOTE — Unmapped (Signed)
Problem: Adult Inpatient Plan of Care  Goal: Plan of Care Review  Outcome: Progressing  Goal: Patient-Specific Goal (Individualized)  Outcome: Progressing  Goal: Absence of Hospital-Acquired Illness or Injury  Outcome: Progressing  Intervention: Identify and Manage Fall Risk  Recent Flowsheet Documentation  Taken 10/19/2021 0820 by Joneen Boers, RN  Safety Interventions:   fall reduction program maintained   lighting adjusted for tasks/safety   low bed   nonskid shoes/slippers when out of bed  Intervention: Prevent Skin Injury  Recent Flowsheet Documentation  Taken 10/19/2021 1617 by Joneen Boers, RN  Skin Protection: adhesive use limited  Taken 10/19/2021 1418 by Joneen Boers, RN  Skin Protection: adhesive use limited  Taken 10/19/2021 1220 by Joneen Boers, RN  Skin Protection: adhesive use limited  Taken 10/19/2021 1019 by Joneen Boers, RN  Skin Protection: adhesive use limited  Taken 10/19/2021 0820 by Joneen Boers, RN  Skin Protection: adhesive use limited  Goal: Optimal Comfort and Wellbeing  Outcome: Progressing  Goal: Readiness for Transition of Care  Outcome: Progressing  Goal: Rounds/Family Conference  Outcome: Progressing     Problem: Infection  Goal: Absence of Infection Signs and Symptoms  Outcome: Progressing     Problem: Impaired Wound Healing  Goal: Optimal Wound Healing  Outcome: Progressing

## 2021-10-20 NOTE — Unmapped (Signed)
I called Adam Keith following his recent discharge related to a newly diagnosed Covid infection. On admission and covid positive result his ponatinib was held for fear of increased VTE risk in setting of covid. He was also given one dose of remdesivir in the hospital before he left AMA. Due to his high risk of progression to a covid related complication, his ICID doctor requested we work with him to start an anti-viral.    I spoke with Adam Keith, he took a dose of his ponatinib last night after he discharged. I asked him to HOLD ponatinib and start Paxlovid (sent to local pharmacy) x 5 days. He should HOLD ponatinib for 1 week, this was added back to his list to restart 7/26 to avoid interaction with Paxlovid. He should continue aspirin. I also warned him that there is a slight interaction with Paxlovid and his blood pressure and pain meds, and he should monitor for low BP (dizziness) and over sedation from his pain meds and let us know/adjust accordingly such as hold doses or move pain meds to just once daily.     I asked that he follow-up in clinic in next 1-2 weeks. We can also think about dosing of his ponatinib in setting of negative BCR-ABL vs leaving him at 30 mg daily.       Manfred Arch, PharmD, BCOP, CPP  Pager: [phone]

## 2021-10-20 NOTE — Unmapped (Signed)
Problem: Adult Inpatient Plan of Care  Goal: Plan of Care Review  10/19/2021 1723 by Joneen Boers, RN  Outcome: Progressing  10/19/2021 1722 by Joneen Boers, RN  Outcome: Progressing  Goal: Patient-Specific Goal (Individualized)  10/19/2021 1723 by Joneen Boers, RN  Outcome: Progressing  10/19/2021 1722 by Joneen Boers, RN  Outcome: Progressing  Goal: Absence of Hospital-Acquired Illness or Injury  10/19/2021 1723 by Joneen Boers, RN  Outcome: Progressing  10/19/2021 1722 by Joneen Boers, RN  Outcome: Progressing  Intervention: Identify and Manage Fall Risk  Recent Flowsheet Documentation  Taken 10/19/2021 0820 by Joneen Boers, RN  Safety Interventions:   fall reduction program maintained   lighting adjusted for tasks/safety   low bed   nonskid shoes/slippers when out of bed  Intervention: Prevent Skin Injury  Recent Flowsheet Documentation  Taken 10/19/2021 1617 by Joneen Boers, RN  Skin Protection: adhesive use limited  Taken 10/19/2021 1418 by Joneen Boers, RN  Skin Protection: adhesive use limited  Taken 10/19/2021 1220 by Joneen Boers, RN  Skin Protection: adhesive use limited  Taken 10/19/2021 1019 by Joneen Boers, RN  Skin Protection: adhesive use limited  Taken 10/19/2021 0820 by Joneen Boers, RN  Skin Protection: adhesive use limited  Goal: Optimal Comfort and Wellbeing  10/19/2021 1723 by Joneen Boers, RN  Outcome: Progressing  10/19/2021 1722 by Joneen Boers, RN  Outcome: Progressing  Goal: Readiness for Transition of Care  10/19/2021 1723 by Joneen Boers, RN  Outcome: Progressing  10/19/2021 1722 by Joneen Boers, RN  Outcome: Progressing  Goal: Rounds/Family Conference  10/19/2021 1723 by Joneen Boers, RN  Outcome: Progressing  10/19/2021 1722 by Joneen Boers, RN  Outcome: Progressing     Problem: Infection  Goal: Absence of Infection Signs and Symptoms  10/19/2021 1723 by Joneen Boers, RN  Outcome: Progressing  10/19/2021 1722 by Joneen Boers, RN  Outcome: Progressing     Problem: Impaired Wound Healing  Goal: Optimal Wound Healing  10/19/2021 1723 by Joneen Boers, RN  Outcome: Progressing  10/19/2021 1722 by Joneen Boers, RN  Outcome: Progressing

## 2021-10-20 NOTE — Unmapped (Signed)
Problem: Adult Inpatient Plan of Care  Goal: Plan of Care Review  10/19/2021 1824 by Joneen Boers, RN  Outcome: Progressing  10/19/2021 1723 by Joneen Boers, RN  Outcome: Progressing  10/19/2021 1722 by Joneen Boers, RN  Outcome: Progressing  Goal: Patient-Specific Goal (Individualized)  10/19/2021 1824 by Joneen Boers, RN  Outcome: Progressing  10/19/2021 1723 by Joneen Boers, RN  Outcome: Progressing  10/19/2021 1722 by Joneen Boers, RN  Outcome: Progressing  Goal: Absence of Hospital-Acquired Illness or Injury  10/19/2021 1824 by Joneen Boers, RN  Outcome: Progressing  10/19/2021 1723 by Joneen Boers, RN  Outcome: Progressing  10/19/2021 1722 by Joneen Boers, RN  Outcome: Progressing  Intervention: Identify and Manage Fall Risk  Recent Flowsheet Documentation  Taken 10/19/2021 0820 by Joneen Boers, RN  Safety Interventions:   fall reduction program maintained   lighting adjusted for tasks/safety   low bed   nonskid shoes/slippers when out of bed  Intervention: Prevent Skin Injury  Recent Flowsheet Documentation  Taken 10/19/2021 1800 by Joneen Boers, RN  Skin Protection: adhesive use limited  Taken 10/19/2021 1617 by Joneen Boers, RN  Skin Protection: adhesive use limited  Taken 10/19/2021 1418 by Joneen Boers, RN  Skin Protection: adhesive use limited  Taken 10/19/2021 1220 by Joneen Boers, RN  Skin Protection: adhesive use limited  Taken 10/19/2021 1019 by Joneen Boers, RN  Skin Protection: adhesive use limited  Taken 10/19/2021 0820 by Joneen Boers, RN  Skin Protection: adhesive use limited  Goal: Optimal Comfort and Wellbeing  10/19/2021 1824 by Joneen Boers, RN  Outcome: Progressing  10/19/2021 1723 by Joneen Boers, RN  Outcome: Progressing  10/19/2021 1722 by Joneen Boers, RN  Outcome: Progressing  Goal: Readiness for Transition of Care  10/19/2021 1824 by Joneen Boers, RN  Outcome: Progressing  10/19/2021 1723 by Joneen Boers, RN  Outcome: Progressing  10/19/2021 1722 by Joneen Boers, RN  Outcome: Progressing  Goal: Rounds/Family Conference  10/19/2021 1824 by Joneen Boers, RN  Outcome: Progressing  10/19/2021 1723 by Joneen Boers, RN  Outcome: Progressing  10/19/2021 1722 by Joneen Boers, RN  Outcome: Progressing     Problem: Infection  Goal: Absence of Infection Signs and Symptoms  10/19/2021 1824 by Joneen Boers, RN  Outcome: Progressing  10/19/2021 1723 by Joneen Boers, RN  Outcome: Progressing  10/19/2021 1722 by Joneen Boers, RN  Outcome: Progressing     Problem: Impaired Wound Healing  Goal: Optimal Wound Healing  10/19/2021 1824 by Joneen Boers, RN  Outcome: Progressing  10/19/2021 1723 by Joneen Boers, RN  Outcome: Progressing  10/19/2021 1722 by Joneen Boers, RN  Outcome: Progressing

## 2021-10-20 NOTE — Unmapped (Signed)
Physician Discharge Summary [facility]  4 ONC [facility]  [address]  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 [facility] Course:   The patient developed a reaction to one of his medications at the start of my shift around 7 PM.  He had slightly darker discoloration of his arms, and a red rash around his waist back and chest that were all new.  He reported his breathing was fine and was without nausea, vomiting or any abdominal pain.  He did have new left-sided pain around his temple and described sensation of echoing and tinnitus in his left ear that was similar to symptoms he had in his right ear before having hearing loss then.  I went to assess the patient in the room.    The pain was severe at onset, occurred 15 to 20 minutes before my assessment and it was not clear what triggered it.  He had never had these symptoms before in this year and he does not get anything like headaches.  When asked if anything for similar pain like this is helped him before his wife got quite upset and explained that he has never had pain like this before.  He had not tried anything to relieve the symptoms yet.  Prior to assessing I had ordered a one-time dose of Benadryl for suspected medication reaction based on rash above.  In the room, I explained that I was not sure what was causing patient's headache and my plan to trial Tylenol and a dose of Benadryl to see if that alleviates his symptoms and he was initially agreeable to this plan.    Several minutes later I received notification from his nursing team that the patient wished to discharge immediately.  I again went to his room to assess the patient and inquire about this.  He explained that he felt he was being treated very poorly and that none of the medications to treat this reaction had arrived yet.  I apologize for this inconvenience explained that we are trying to get the medications as fast as we could.  He was also upset that he was not able to use his home Dilaudid.  I explained that he had already been ordered a as needed order for oxycodone, and that I would happily exchange this order for Dilaudid instead.  However the patient was adamant he wanted to leave.  I asked him if he understood the risks of this and he explained to me that he knew he could have worsening fever.  I also explained that his symptoms could include fever, worsening shortness of breath and he could even have worsening complications including worsening of his infection and possibilities as serious as death.  He verbally acknowledged understanding of this to me and explained that he still wanted to leave.  His rationale was that he believed he could be treated better elsewhere.    I explained to him that he should come back to [facility] or another ED if he develops fever or any worsening symptoms right away.  He acknowledged and agreed to do this and also explained that he had close follow-up with his oncology team and infectious disease team and was even able to get in contact with them tonight as soon as tomorrow morning.  I offered to prescribe antibiotics and then on further review and with discussion with our pharmacist decided the best course was to discontinue his home prophylactic doxycycline 100 mg twice daily.  I called the patient without response and then his wife who picked up and did say they had enough refills of the doxycycline and understood they should continue that medication twice a day 100 mg each dose.     The patient presented with URI symptoms, fevers, myalgias felt to be mostly secondary to COVID infection.  And was noted to have a background of moderate to severe lung disease and deemed high risk for severe COVID-pneumonia.  IC ID was consulted during the hospitalization and advised discontinuation of antibacterials with lower concern for superimposed bacterial infection.  He received a loading dose of remdesivir before departing tonight.  Broad infectious work-up was collected and pending at discharge.  The patient reported close follow-up with his infectious disease team and the primary team will be notified 7/19 for consideration of further COVID therapeutics to prevent disease progression.  His ponatinib was held on admission in setting of infection possibly for increased risk of thrombosis and was also stopped on discharge.  His primary oncologist was notified of discharge AGAINST MEDICAL ADVICE.     Patient does not meet AND/ASPEN criteria for malnutrition at this time (10/19/21 1144)       Outpatient Provider Follow Up Issues:   - restarting ponatinib  - consideration of outpt vs inpt meds to prevent Covid infx progression     Touchbase with Outpatient Provider:  Warm Handoff: Completed on 10/19/21 by Mel Almond  (Resident) via [facility] Message    Procedures:  None  ______________________________________________________________________  Discharge Medications:      Your Medication List        STOP taking these medications      acetaminophen 325 MG tablet  Commonly known as: TYLENOL     albuterol 90 mcg/actuation inhaler  Commonly known as: PROVENTIL HFA;VENTOLIN HFA     ICLUSIG 30 mg tablet  Generic drug: PONATinib     mupirocin 2 % ointment  Commonly known as: BACTROBAN            CHANGE how you take these medications      gabapentin 300 MG capsule  Commonly known as: NEURONTIN  Take 2 capsules (600mg ) by mouth in the morning and 3 capsules (900mg ) at bedtime. May cause drowsiness.  What changed:   how much to take  when to take this  additional instructions            CONTINUE taking these  medications      amLODIPine 10 MG tablet  Commonly known as: NORVASC  Take 1 tablet (10 mg total) by mouth daily.     aspirin 81 MG chewable tablet  Chew 1 tablet (81 mg total) daily.     carvediloL 12.5 MG tablet  Commonly known as: COREG  Take 1 tablet (12.5 mg total) by mouth Two (2) times a day.     clonazePAM 0.5 MG tablet  Commonly known as: KlonoPIN  Take 1 tablet (0.5 mg total) by mouth two (2) times a day as needed for anxiety or sleep.     docusate sodium 100 MG capsule  Commonly known as: COLACE  Take 1 capsule (100 mg total) by mouth daily.     doxycycline 100 MG tablet  Commonly known as: VIBRA-TABS  Take 1 tablet (100 mg total) by mouth Two (2) times a day.     famotidine 20 MG tablet  Commonly known as: PEPCID  Take 1 tablet (20 mg total) by mouth Two (2) times a day.     HYDROmorphone 2 MG tablet  Commonly known as: DILAUDID  Take 1 tablet (2 mg total) by mouth two (2) times a day as needed.     mirtazapine 15 MG tablet  Commonly known as: REMERON  TAKE 1 TABLET BY MOUTH EVERY DAY AT NIGHT     mucus clearing device Devi  1 each by Miscellaneous route Two (2) times a day.     nortriptyline 50 MG capsule  Commonly known as: PAMELOR  Take 2 capsules (100 mg total) by mouth nightly.     OxyCONTIN 15 mg 12 hr crush resistant ER/CR tablet  Generic drug: oxyCODONE  Take 1 tablet (15 mg total) by mouth Two (2) times a day.     sulfamethoxazole-trimethoprim 800-160 mg per tablet  Commonly known as: BACTRIM DS  TAKE 1 TABLET BY MOUTH 2 TIMES A DAY ON SATURDAY, SUNDAY. FOR PROPHYLAXIS WHILE ON CHEMO.     umeclidinium-vilanteroL 62.5-25 mcg/actuation inhaler  Commonly known as: ANORO ELLIPTA  Inhale 1 puff daily.     valACYclovir 500 MG tablet  Commonly known as: VALTREX  Take 1 tablet (500 mg total) by mouth daily.     WRIST BRACE Misc  Generic drug: arm brace  1 Piece by Miscellaneous route as needed. left ulnar wrist brace              Allergies:  Bupropion hcl, Cefepime, Ceftaroline fosamil, Dapsone, Onion, Vancomycin analogues, Bismuth subsalicylate, Privigen [immun glob g(igg)-pro-iga 0-50], and Furosemide  ______________________________________________________________________  Pending Test Results:  Pending Labs       Order Current Status    Type and Screen Collected (10/19/21 0645)    Blood Culture In process    Blood Culture In process    MRSA Screen In process            Most Recent Labs:  All lab results last 24 hours -   Recent Results (from the past 24 hour(s))   RAPID Influenza /RSV/COVID PCR    Collection Time: 10/18/21 11:59 PM    Specimen: Nasopharyngeal Swab   Result Value Ref Range    SARS-CoV-2 PCR Positive (A) Negative    Influenza A Negative Negative    Influenza B Negative Negative    RSV Negative Negative    SARS-CoV-2 CT Value 20.6    GREEN LITHIUM HEPARIN EXTRA TUBE    Collection Time: 10/18/21 11:59 PM   Result Value Ref Range  Extra Tube Green Lithium Heparin     Comprehensive Metabolic Panel    Collection Time: 10/18/21 11:59 PM   Result Value Ref Range    Sodium 138 135 - 145 mmol/L    Potassium 4.0 3.4 - 4.8 mmol/L    Chloride 103 98 - 107 mmol/L    CO2 25.0 20.0 - 31.0 mmol/L    Anion Gap 10 5 - 14 mmol/L    BUN 15 9 - 23 mg/dL    Creatinine 1.61 (H) 0.60 - 1.10 mg/dL    BUN/Creatinine Ratio 7     eGFR CKD-EPI (2021) Male 38 (L) >=60 mL/min/1.91m2    Glucose 101 70 - 179 mg/dL    Calcium 9.0 8.7 - 09.6 mg/dL    Albumin 4.6 3.4 - 5.0 g/dL    Total Protein 7.2 5.7 - 8.2 g/dL    Total Bilirubin 0.4 0.3 - 1.2 mg/dL    AST 34 <=04 U/L    ALT 47 10 - 49 U/L    Alkaline Phosphatase 99 46 - 116 U/L   hsTroponin I (single, no delta)    Collection Time: 10/18/21 11:59 PM   Result Value Ref Range    hsTroponin I 9 <=53 ng/L   CBC w/ Differential    Collection Time: 10/18/21 11:59 PM   Result Value Ref Range    WBC 7.9 3.6 - 11.2 10*9/L    RBC 5.84 (H) 4.26 - 5.60 10*12/L    HGB 17.3 (H) 12.9 - 16.5 g/dL    HCT 54.0 (H) 98.1 - 48.0 %    MCV 84.8 77.6 - 95.7 fL    MCH 29.6 25.9 - 32.4 pg    MCHC 34.9 32.0 - 36.0 g/dL    RDW 19.1 (H) 47.8 - 15.2 %    MPV 9.1 6.8 - 10.7 fL    Platelet 133 (L) 150 - 450 10*9/L    Neutrophils % 73.1 %    Lymphocytes % 14.2 %    Monocytes % 11.0 %    Eosinophils % 0.6 %    Basophils % 1.1 %    Absolute Neutrophils 5.7 1.8 - 7.8 10*9/L    Absolute Lymphocytes 1.1 1.1 - 3.6 10*9/L    Absolute Monocytes 0.9 (H) 0.3 - 0.8 10*9/L    Absolute Eosinophils 0.0 0.0 - 0.5 10*9/L    Absolute Basophils 0.1 0.0 - 0.1 10*9/L   Hemoglobin A1c    Collection Time: 10/18/21 11:59 PM   Result Value Ref Range    Hemoglobin A1C 4.8 4.8 - 5.6 %    Estimated Average Glucose 91 mg/dL   Blood Gas Critical Care Panel, Venous    Collection Time: 10/19/21  1:05 AM   Result Value Ref Range    Specimen Source Venous     FIO2 Venous Not Specified     pH, Venous 7.33 7.32 - 7.43    pCO2, Ven 51 40 - 60 mm Hg    pO2, Ven <31 30 - 55 mm Hg    HCO3, Ven 27 22 - 27 mmol/L    Base Excess, Ven 0.0 -2.0 - 2.0    O2 Saturation, Venous 29.5 (L) 40.0 - 85.0 %    Sodium Whole Blood 136 135 - 145 mmol/L    Potassium, Bld 3.7 3.4 - 4.6 mmol/L    Calcium, Ionized Venous 4.71 4.40 - 5.40 mg/dL    Glucose Whole Blood 92 70 - 179 mg/dL    Lactate, Venous 1.1 0.5 - 1.8  mmol/L    Hgb, blood gas 17.00 13.50 - 17.50 g/dL   Lactate Sepsis, Venous    Collection Time: 10/19/21  1:05 AM   Result Value Ref Range    Lactate, Venous 1.1 0.5 - 1.8 mmol/L   Lactate Sepsis, Venous    Collection Time: 10/19/21  1:05 AM Result Value Ref Range    Lactate, Venous 1.9 (H) 0.5 - 1.8 mmol/L   ECG 12 Lead    Collection Time: 10/19/21  6:44 AM   Result Value Ref Range    EKG Systolic BP  mmHg    EKG Diastolic BP  mmHg    EKG Ventricular Rate 84 BPM    EKG Atrial Rate 84 BPM    EKG P-R Interval 146 ms    EKG QRS Duration 100 ms    EKG Q-T Interval 364 ms    EKG QTC Calculation 430 ms    EKG Calculated P Axis 59 degrees    EKG Calculated R Axis 100 degrees    EKG Calculated T Axis 74 degrees    QTC Fredericia 407 ms   Type and Screen on admission    Collection Time: 10/19/21  6:45 AM   Result Value Ref Range    Select Screen 4 NEG     Blood Type O POS    Urinalysis with Microscopy with Culture Reflex    Collection Time: 10/19/21  7:02 AM    Specimen: Clean Catch; Urine   Result Value Ref Range    Color, UA Yellow     Clarity, UA Clear     Specific Gravity, UA 1.026 1.003 - 1.030    pH, UA 6.0 5.0 - 9.0    Leukocyte Esterase, UA Negative Negative    Nitrite, UA Negative Negative    Protein, UA 70 mg/dL (A) Negative    Glucose, UA Negative Negative    Ketones, UA Negative Negative    Urobilinogen, UA <2.0 mg/dL <1.6 mg/dL    Bilirubin, UA Negative Negative    Blood, UA Negative Negative    RBC, UA <1 <=3 /HPF    WBC, UA 1 <=2 /HPF    Squam Epithel, UA <1 0 - 5 /HPF    Bacteria, UA None Seen None Seen /HPF    Mucus, UA Rare (A) None Seen /HPF   IgG    Collection Time: 10/19/21 10:25 AM   Result Value Ref Range    Total IgG 321 (L) 646 - 2,013 mg/dL       Relevant Studies/Radiology:  ECG 12 Lead    Result Date: 10/19/2021  NORMAL SINUS RHYTHM POSSIBLE LEFT ATRIAL ENLARGEMENT RIGHTWARD AXIS BORDERLINE ECG WHEN COMPARED WITH ECG OF 13-Mar-2021 06:24, NO SIGNIFICANT CHANGE WAS FOUND    XR Chest 2 views    Result Date: 10/19/2021  EXAM: XR CHEST 2 VIEWS DATE: 10/19/2021 3:23 AM ACCESSION: [id number] UN DICTATED: 10/19/2021 4:22 AM INTERPRETATION LOCATION: [facility] CLINICAL INDICATION: 41 year old male with fever.  TECHNIQUE: PA and Lateral Chest Radiographs. COMPARISON: Chest radiograph 08/27/2021, CT chest 09/22/2021. FINDINGS: Residual scarring in the right apex at site of prior extensive consolidation noted in December 2022 and January 2023. No acute consolidation.. No pleural effusion or pneumothorax. Stable cardiomediastinal contour.     Residual abnormalities right apex improved since January 31 without much change since May 26. The appearance is consistent with postinflammatory scarring as noted on CT June 21. ==================== MODIFIED REPORT: (10/19/2021 8:48 AM) This report has been modified from its preliminary version;  you may check the prior versions of radiology report, results history link for prior report versions. -----------------------------------------------   ______________________________________________________________________  Discharge Instructions:   Activity Instructions       Activity as tolerated                       Follow Up instructions and Outpatient Referrals     Call MD for:  difficulty breathing, headache or visual disturbances      Call MD for:  persistent nausea or vomiting      Call MD for:  severe uncontrolled pain      Call MD for:  temperature >38.5 Celsius          Appointments which have been scheduled for you      Oct 27, 2021 11:30 AM  (Arrive by 11:00 AM)  RETURN FOLLOW UP [place] with Artelia Laroche, MD  [facility] BMT [place] [facility] REGION) [address]  [place] [address]  [phone]        Nov 05, 2021 12:00 PM  (Arrive by 11:30 AM)  RETURN VIDEO MYCHART with Ward Givens, PMHNP  [facility]SP 2ND FLR CANCER HOSP [place] [facility] REGION) [address]  [place] [address]  [phone]   Please sign into My [place] Chart at least 15 minutes before your appointment to complete the eCheck-In process. You must complete eCheck-In before you can start your video visit. We also recommend testing your audio and video connection to troubleshoot any issues before your visit begins. Click ???Join Video Visit??? to complete these checks. Once you have completed eCheck-In and tested your audio and video, click ???Join Call??? to connect to your visit.     For your video visit, you will need a computer with a working camera, speaker and microphone, a smartphone, or a tablet with internet access.    My [place] Chart enables you to manage your health, send non-urgent messages to your provider, view your test results, schedule and manage appointments, and request prescription refills securely and conveniently from your computer or mobile device.    You can go to [email or web address] to sign in to your My [place] Chart account with your username and password. If you have forgotten your username or password, please choose the ???Forgot Username???? and/or ???Forgot Password???? links to gain access. You also can access your My [place] Chart account with the free MyChart mobile app for Android or iPhone.    If you need assistance accessing your My  Chart account or for assistance in reaching your provider's office to reschedule or cancel your appointment, please call [facility] [phone].           Dec 02, 2021  7:30 AM  (Arrive by 7:00 AM)  LAB ONLY [place] with ADULT ONC LAB  [facility] ADULT ONCOLOGY LAB DRAW STATION [facility] REGION) [address]  [place] [address]  [phone]        Dec 02, 2021  8:30 AM  (Arrive by 8:00 AM)  RETURN ACTIVE [place] with [name], MD  [facility] HEMATOLOGY ONCOLOGY 2ND FLR CANCER HOSP [facility] REGION) [address]  [place] [address]  [phone]             ______________________________________________________________________  Discharge Day Services:  BP 133/67  - Pulse 97  - Temp 36.6 ??C (97.9 ??F) (Oral)  - Resp  18  - SpO2 100%     Pt left AMA.     Condition at Discharge: poor    Length of Discharge: I spent greater than 30 mins in the discharge of this patient.

## 2021-10-21 DIAGNOSIS — C9101 Acute lymphoblastic leukemia, in remission: Principal | ICD-10-CM

## 2021-10-21 NOTE — Unmapped (Signed)
Clinical Assessment Needed For: Dose Change  Medication: Iclusig  Last Fill Date/Day Supply: 7/19 / 30  Copay $4  Was previous dose already scheduled to fill: No    Notes to Pharmacist: Reduction

## 2021-10-25 DIAGNOSIS — H9190 Unspecified hearing loss, unspecified ear: Principal | ICD-10-CM

## 2021-10-25 NOTE — Unmapped (Signed)
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 Oswald Hillock due to COVID+ status. Per Adam Keith he'd spoken to [facility] while recently inpatient and has decided against stem cell transplant for now.  He does not wish to proceed.  BMT appt has been cancelled. Patient made aware he has three unrelated 10/10 donor options if he is to change his mind in the future.  Requested he call this transplant coordinator back if he has any follow-up needs or questions.

## 2021-10-27 ENCOUNTER — Ambulatory Visit: Admit: 2021-10-27 | Discharge: 2021-10-28 | Payer: MEDICAID

## 2021-10-27 ENCOUNTER — Telehealth
Admit: 2021-10-27 | Discharge: 2021-10-28 | Payer: MEDICAID | Attending: Student in an Organized Health Care Education/Training Program | Primary: Student in an Organized Health Care Education/Training Program

## 2021-10-27 DIAGNOSIS — G893 Neoplasm related pain (acute) (chronic): Principal | ICD-10-CM

## 2021-10-27 DIAGNOSIS — Z515 Encounter for palliative care: Principal | ICD-10-CM

## 2021-10-27 DIAGNOSIS — F32A Depression, unspecified depression type: Principal | ICD-10-CM

## 2021-10-27 MED ORDER — OXYCONTIN 15 MG TABLET,CRUSH RESISTANT,EXTENDED RELEASE
ORAL_TABLET | Freq: Two times a day (BID) | ORAL | 0 refills | 28 days | Status: CP
Start: 2021-10-27 — End: ?

## 2021-10-27 MED ORDER — PONATINIB 15 MG TABLET
ORAL_TABLET | Freq: Every day | ORAL | 5 refills | 30 days | Status: CP
Start: 2021-10-27 — End: ?

## 2021-10-27 MED ORDER — PONATINIB 30 MG TABLET
ORAL_TABLET | Freq: Every day | ORAL | 5 refills | 30 days | Status: CP
Start: 2021-10-27 — End: ?
  Filled 2021-11-01: qty 30, 30d supply, fill #0

## 2021-10-27 NOTE — Unmapped (Signed)
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 He was recently admitted to [facility] with with complicated PNA requiring intubation.  Has had episodic fluctuations in mood but currently doing fairly well.  Has decided not to pursue bone marrow transplant at this time.    Assessment/Plan:     # Depressed Mood: Continues to improve as he has been more active.  Still intermittent periods of depression but feels like he is doing better. His wife is his primary support, and he also follows with psych and a therapist.    -Medications per [name]      #Pain: Right lower extremity pain resolved.  Now predominantly has chronic back pain.  Very motivated to get off of opioids given his goal is to return to work and he worries that this is not allowed for his job.  No changes today.  -Continue gabapentin to 600mg  qAM and 900mg  qbedtime  -No longer using Dilaudid dilaudid 2mg  PO BID PRN       - PDMP reviewed  - Continue oxycontin 15mg  PO BID, new prescription sent today       - PDMP reviewed,   - Referral to cancer rehab, so, but was not interested in following up with rehab or physical therapy    # LUE pain: Improved.   - Pain management as above    #Chronic pain: At baseline, see above  - Pain management as above    #Advance care planning: Continues to prioritize quality of life.  After discussion with infectious disease as well as oncology has made the decision not to pursue another bone marrow transplant at this time.    #Controlled substances risk management:  ??? Patient does not have a signed pain medication agreement with our team.  ??? [place] database was reviewed today and it was??appropriate.  ??? Urine drug screen was not??performed at this visit. Findings: not applicable.  ??? Patient has received information about safe storage and administration of medications.  ??? Patient has not??received a prescription for narcan; is not applicable.     F/u: Fu on 7/19  ----------------------------------------  Referring Provider:??Dr. Berline Lopes  Oncology Team: Malignant hematology team  PCP:??[name], MD    HPI: 41 year old man with a diagnosis of B-ALL, diagnosed in October 2021, now in complete remission.??Recently admitted to the [facility] ICU 03/06/21-03/20/21 for AHRF d/t MRSA/klebsiella PNA, requiring intubation. This is following admissions in August, May, April, March, February, and January 2022.    Interval History:   Had recurrent fevers and was admitted to the hospital. Became very frustrated because he was having a reaction to an antibiotic and was angry that it took 3 hours to get Benadryl. Was also having pain in his L ear. Left AMA.     Having to follow up with ENT bc he is worried about losing hearing in his L ear. 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 No longer able to hear traffic. 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     Has decided not to do the BMT. Met with ID and feels like the risk isn't worth the potential benefits.     Mood has been up and down. Remains frustrated and irritable at times, but overall feels like his is pretty stable. Has gotten to see his family a lot the last few days. His daughters are starting college in a few  weeks. These are his twins (67 year olds) from his first marriage. [name]'s oldest son died 12 years ago. He just adopted [name]'s son and daughter 2 months ago. They are 20 and 19. Twins live with their mom, son lives with gf, daughter lives with BF and grandparents.     Has been able to get out in the shop, mowed the lawn last night (has to avoid the sun).     Pain has been tough, back hurting a lot. [facility]. Still sleeping well. Not having to take Dilaudid.       Palliative Performance Scale: 90% - Ambulation: Full / Normal Activity, some evidence of disease / Self-Care:Full / Intake: Normal / Level of Conscious: Full    Coping/Support Issues: Having a lot of difficulty coping with his current setback. Well supported by his wife. Also has a psychiatrist and therapist.  ??  Goals of Care: Treatment and cancer-directed therapy  ??  Social History:  Name of primary support: [name], 2 daughters.  Occupation: Was in the [employer]	[place]  Hobbies: Woodworking  Current residence / distance from [facility]: [place]    Has a 3 year old son.   ??  Advance Care Planning: Did not discuss this visit.    Objective     Allergies:   Allergies   Allergen Reactions   ??? Bupropion Hcl Other (See Comments)     Per patient out of touch with reality, suicidal, homicidal   ??? Cefepime Rash     DRESS   ??? Ceftaroline Fosamil Rash and Other (See Comments)     Rash X 2 02/2020, suspected DRESS 06/2020   ??? 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 Onion Anaphylaxis   ??? Vancomycin Analogues      Hearing loss with Lasix  Other reaction(s): Other (See Comments)  Hearing loss  lasix   ??? Bismuth Subsalicylate Nausea And Vomiting   ??? Privigen [Immun Glob G(Igg)-Pro-Iga 0-50] Other (See Comments)     03/10/2021 VIG infusion stopped as patient developed rigors, tachycardia and HTN during infusion despite pre-medication; 03/12/21 completed IVIG with premedications and slower rate of infusion   ??? Furosemide      With Vancomycin caused hearing loss  Other reaction(s): Other (See Comments)  With Vancomycin caused hearing loss       Family History:  Cancer-related family history is negative for Melanoma.  He indicated that the status of his neg hx is unknown.      REVIEW OF SYSTEMS:  A comprehensive review of 10 systems was negative except for pertinent positives noted in HPI.    Lab Results   Component Value Date    CREATININE 2.15 (H) 10/18/2021     Lab Results   Component Value Date    ALKPHOS 99 10/18/2021    BILITOT 0.4 10/18/2021    BILIDIR 0.30 03/20/2021    PROT 7.2 10/18/2021    ALBUMIN 4.6 10/18/2021    ALT 47 10/18/2021    AST 34 10/18/2021     Physical exam:  General: Overall well-appearing man in no acute distress  Pulmonary: No increased work of breathing  Neurological: Normal gait, no acute deficits  Psychiatric: Mood pretty good today affect appropriate and full range         The patient reports they are currently: at home. I spent 22 minutes on the real-time audio and  video with the patient on the date of service. I spent an additional 12 minutes on pre- and post-visit activities on the date of service.     The patient was physically located in [place] or a state in which I am permitted to provide care. The patient and/or parent/guardian understood that s/he may incur co-pays and cost sharing, and agreed to the telemedicine visit. The visit was reasonable and appropriate under the circumstances given the patient's presentation at the time.    The patient and/or parent/guardian has been advised of the potential risks and limitations of this mode of treatment (including, but not limited to, the absence of in-person examination) and has agreed to be treated using telemedicine. The patient's/patient's family's questions regarding telemedicine have been answered.     If the visit was completed in an ambulatory setting, the patient and/or parent/guardian has also been advised to contact their provider???s office for worsening conditions, and seek emergency medical treatment and/or call 911 if the patient deems either necessary.    Kathlynn Grate, MD  [facility] Outpatient Oncology Palliative Care

## 2021-10-28 ENCOUNTER — Ambulatory Visit: Admit: 2021-10-28 | Discharge: 2021-10-28 | Payer: MEDICAID

## 2021-10-28 ENCOUNTER — Institutional Professional Consult (permissible substitution): Admit: 2021-10-28 | Discharge: 2021-10-28 | Payer: MEDICAID | Attending: Audiologist | Primary: Audiologist

## 2021-10-28 DIAGNOSIS — H9190 Unspecified hearing loss, unspecified ear: Principal | ICD-10-CM

## 2021-10-28 DIAGNOSIS — H906 Mixed conductive and sensorineural hearing loss, bilateral: Principal | ICD-10-CM

## 2021-10-28 NOTE — Unmapped (Signed)
[facility]  Department of Audiology    AUDIOLOGIC EVALUATION REPORT     PATIENT: Adam Keith, Adam Keith  DOB: [birth year]  MRN: [id number]  DOS: 10/28/2021    PLEASE SEE AUDIOGRAM INCLUDING FULL REPORT IN MEDIA MANAGER TAB.    HISTORY     [Pain 0/10]  [name] is a 41 y.o. individual seen by Audiology for a hearing evaluation in conjunction with ENT visit; patient is seeing Dr. Noemi Chapel today. Patient was accompanied by his wife, [name] to today's appointment. His medical history is significant for sudden right hearing loss 1 year ago, attributed to medication. Today, patient reports about a week ago he was in the hospital and reported having a reaction to a medication, similar to when he lost his hearing. He indicates he left the hospital at that point. He does not hear anything on the right side        RESULTS     Otoscopy  RIGHT Ear: clear external auditory canal  LEFT Ear: clear external auditory canal    Tympanometry - using a 226 Hz probe tone  RIGHT Ear: Type C tympanogram, consistent with negative middle ear pressure  LEFT Ear: Type B tympanogram, consistent with abnormal middle ear function      Acoustic Reflexes  RIGHT Ipsilateral (Stim. R): absent at [phone] Hz  LEFT Ipsilateral (Stim. L): absent at [phone] Hz    Pure Tone and Speech Audiometry  Today's behavioral evaluation was completed using conventional audiometry via insert earphones with good reliability.     RIGHT Ear: profound mixed hearing loss *potential undermasking due to contralateral conductive component   Speech Recognition Threshold (SRT): air conduction: NR dB HL (masked)  bone conduction: 50 dB HL (masked)  Word Recognition Score (Recorded NU-6): air conduction: 0% at 110 dB HL  bone conduction: 68% at 60 dB HL (masked at 95 dB HL)    LEFT Ear: mild to moderately severe mixed hearing loss  Speech Recognition Threshold (SRT): air conduction: 45 dB HL  bone conduction: 20 dB HL  Word Recognition Score (Recorded NU-6): 100% at 85 dB HL (could not mask effectively)    IMPRESSIONS   Right ear: profound mixed hearing loss with good word recognition via masked bone conduction  Left ear: mild to moderately severe mixed hearing loss with excellent word recognition     Patient was counseled on today's results and expressed understanding.    RECOMMENDATIONS      ENT - Seeing Dr. Melton Krebs today, repeat testing to re-assess RIGHT hearing once left conductive component is resolved   Re-evaluate hearing per ENT request, sooner should concerns arise          Care Provider(s) wore appropriate PPE throughout entire appointment.     Procedure(s):    CPT [id number] - Tympanometry    CPT [id number] - Comprehensive Audio Eval & Speech Recognition       Visit Time: 30 min    Alanda Amass, AuD  Clinical Audiologist  [facility] Adult Audiology Program  Scheduling [phone]

## 2021-10-28 NOTE — Unmapped (Signed)
Adam Keith is seen in consultation at the request of Dr. Gillermina Hu for the evaluation of left-sided hearing loss.    Dear Dr. Gillermina Hu,    Thank you for referring [name]; as you know he is a 41 y.o. male who presents for the evaluation of left-sided hearing loss. His medical history is significant for sudden right hearing loss 1 year ago, attributed to medication. He was admitted to the ED 10/18/2021 for a Covid infection. Today he reports that he doesn't hear anything out of the right side. He was put on antibiotics during his recent hospitalization and felt like this was giving him hearing loss similar to when he lost hearing in his right ear. He has taken steroids before, but is not currently on any. He is unsure if he can take steroids with his current medication regimen. He has tried a right-sided hearing aid previously without relief. He reports a history of ear issues as a child.    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    Past Surgical History  His  has a past surgical history that includes Bone Marrow Biopsy & Aspiration (01/21/2020); IR Insert Port Age Greater Than 5 Years (03/10/2020); pr bronchoscopy,diagnostic w lavage (Bilateral, 07/20/2020); and chg [place], chest,real time (11/20/2020).    Past Family History  His family history includes Cancer in his maternal grandfather and mother; No Known Problems in his brother, father, maternal aunt, maternal grandmother, maternal uncle, paternal aunt, paternal grandfather, paternal grandmother, paternal uncle, and sister.    Past Social History  He  reports that he has been smoking cigarettes. He has been smoking an average of 2 packs per day. He has been exposed to tobacco smoke. He has quit using smokeless tobacco.  His smokeless tobacco use included chew. He reports that he does not currently use alcohol.    Medications/Allergies  His current medication(s) include:    Current Outpatient Medications:     amLODIPine (NORVASC) 10 MG tablet, Take 1 tablet (10 mg total) by mouth daily., Disp: 30 tablet, Rfl: 0    arm brace (WRIST BRACE) Misc, 1 Piece by Miscellaneous route as needed. left ulnar wrist brace, Disp: 1 each, Rfl: 0    aspirin 81 MG chewable tablet, Chew 1 tablet (81 mg total) daily., Disp: 36 tablet, Rfl: 11    carvediloL (COREG) 12.5 MG tablet, Take 1 tablet (12.5 mg total) by mouth Two (2) times a day., Disp: 180 tablet, Rfl: 1    clonazePAM (KLONOPIN) 0.5 MG tablet, Take 1 tablet (0.5 mg total) by mouth two (2) times a day as needed for anxiety or sleep., Disp: 60 tablet, Rfl: 1    docusate sodium (COLACE) 100 MG capsule, Take 1 capsule (100 mg total) by mouth daily., Disp: , Rfl:     doxycycline (VIBRA-TABS) 100 MG tablet, Take 1 tablet (100 mg total) by mouth Two (2) times a day., Disp: 180 tablet, Rfl: 3    famotidine (PEPCID) 20 MG tablet, Take 1 tablet (20 mg total) by mouth Two (2) times a day., Disp: , Rfl:     gabapentin (NEURONTIN) 300 MG capsule, Take 2 capsules (600mg ) by mouth in the morning and 3 capsules (900mg ) at bedtime. May cause drowsiness. (Patient taking differently: 2 capsules (600 mg total) two (2) times a day. Take 2 capsules (600mg ) by mouth in the morning and 2 capsules (600mg ) at bedtime. May cause drowsiness.), Disp: 150 capsule, Rfl:  5    HYDROmorphone (DILAUDID) 2 MG tablet, Take 1 tablet (2 mg total) by mouth two (2) times a day as needed., Disp: 56 tablet, Rfl: 0    mirtazapine (REMERON) 15 MG tablet, TAKE 1 TABLET BY MOUTH EVERY DAY AT NIGHT, Disp: 90 tablet, Rfl: 4    mucus clearing device Devi, 1 each by Miscellaneous route Two (2) times a day., Disp: 1 each, Rfl: 0    nirmatrelvir-ritonavir (PAXLOVID, EUA,) 300 mg (150 mg x 2)-100 mg tablet, See package instructions., Disp: 30 tablet, Rfl: 0    nortriptyline (PAMELOR) 50 MG capsule, Take 2 capsules (100 mg total) by mouth nightly., Disp: , Rfl:     OXYCONTIN 15 mg 12 hr crush resistant ER/CR tablet, Take 1 tablet (15 mg total) by mouth Two (2) times a day., Disp: 56 tablet, Rfl: 0    PONATinib (ICLUSIG) 15 mg tablet, Take 1 tablet (15 mg total) by mouth daily. Swallow tablets whole. Do not crush, break, cut or chew tablets., Disp: 30 tablet, Rfl: 5    sulfamethoxazole-trimethoprim (BACTRIM DS) 800-160 mg per tablet, TAKE 1 TABLET BY MOUTH 2 TIMES A DAY ON SATURDAY, SUNDAY. FOR PROPHYLAXIS WHILE ON CHEMO., Disp: 48 tablet, Rfl: 3    umeclidinium-vilanteroL (ANORO ELLIPTA) 62.5-25 mcg/actuation inhaler, Inhale 1 puff daily., Disp: 60 each, Rfl: 11    valACYclovir (VALTREX) 500 MG tablet, Take 1 tablet (500 mg total) by mouth daily., Disp: 90 tablet, Rfl: 11  Allergies: Bupropion hcl, Cefepime, Ceftaroline fosamil, Dapsone, Onion, Vancomycin analogues, Bismuth subsalicylate, Privigen [immun glob g(igg)-pro-iga 0-50], and Furosemide,    Review of systems   Review of systems was reviewed on attached notes/patient intake forms.      Physical Examination  Temp 36.3 ??C (97.3 ??F)  - Ht 188 cm (6' 2)  - Wt (!) 122.5 kg (270 lb)  - BMI 34.67 kg/m??     General: well appearing, stated age, no distress   Communicates age appropriate, responds to speech well  Head - atraumatic, normocephalic   Face - no surface abnormalities, no tenderness over sinuses   Eyes - no conjunctivitis, no scleritis/iritis, EOM full   Nose - external nose without deformity, anterior rhinoscopy - turbinates, mucosa normal   Oral Cavity/Oropharynx/Lips:  Normal mucous membranes, normal floor of mouth/tongue/OP, no masses or lesions are noted.  Salivary Glands - no mass or asymmetry, no tenderness   Neck - no palpable masses/adenopathy, no thyromegaly   Lymphatic - no neck lymphadenopathy, no lymphedema   Cardiovascular - regular heart rate, no clubbing/cyanosis/edema in hands  Respiratory - breathing comfortably, no audible wheezing or stridor  Psychiatric- appropriate mood and affect  Neurologic - cranial nerves 2-12 intact  Facial Strength - HB 1/6 bilaterally    Ears - External ear- normal, no lesions, no malformations   Otoscopy - Right EAC clear, TM dull with scar. Left EAC clear, TM intact.    Tuning fork test -   Not performed    Audiogram  The audiogram was personally reviewed and interpreted. This demonstrates profound mixed hearing loss *potential undermasking due to contralateral conductive component on the right and mild to moderately severe mixed hearing loss on the left. 100% word recognition score on the left.        Tympanogram  The tympanogram was personally reviewed and interpreted. This demonstrates type C on the right and type B on the left.    Imaging  I personally reviewed and interpreted the patients imaging studies. 10/11/2021 CT Sinus  did not reveal significant effusion within the middle ear.     I reviewed outside medical records.    Assessment and Plan  The patient is a 41 y.o. male with bilateral hearing loss. He reports no hearing in his right ear and has a decrease in hearing on today's audiogram compared to previous, although similar to levels measured in 2021. His exam today and his recent imaging had no evidence of effusion. We discussed the possible etiologies of his hearing loss at length. I recommended consideration of steroids for treatment of his hearing loss, but he is not sure if he can be on steroids at this time considering his other medications. He should contact his other providers if it is safe for him to take steroids, he can contact me and I will provide him with a prescription. I encouraged him to use Flonase and a nasal saline irrigation for his ear popping. We briefly discussed the possibility of myringotomy given his tympanograms, and we discussed the risks and alternatives of myringotomy and tympanostomy tube placement at length. I will follow-up with him in 2 months with a repeat audiogram. He can contact me with any questions or concerns.    I appreciate the opportunity to participate in his care.    This record has been created using voice recognition software and may have errors that were not dictated and not seen in editing.    Teaching Attestation: Dr. Melton Krebs saw and evaluated the patient and participated in all aspects of the patient encounter and all procedures.    Scribe's Attestation: Mary Sella. Melton Krebs, MD, PhD, obtained and performed the history, physical exam and medical decision making elements that were entered into the chart. Signed by Jacques Navy, Scribe, on October 28, 2021 at 2:44 PM.    ----------------------------------------------------------------------------------------------------------------------  October 29, 2021 6:51 AM. Documentation assistance provided by the Scribe. I was present during the time the encounter was recorded. The information recorded by the Scribe was done at my direction and has been reviewed and validated by me.  ----------------------------------------------------------------------------------------------------------------------

## 2021-10-28 NOTE — Unmapped (Signed)
[place] [facility] Clinical Assessment & Refill Coordination Note    Adam Keith, DOB: Feb 20, 1981  Phone: [phone] (home)     All above HIPAA information was verified with patient.     Was a Nurse, learning disability used for this call? No    Specialty Medication(s):   Hematology/Oncology: Iclusig     Current Outpatient Medications   Medication Sig Dispense Refill    amLODIPine (NORVASC) 10 MG tablet Take 1 tablet (10 mg total) by mouth daily. 30 tablet 0    arm brace (WRIST BRACE) Misc 1 Piece by Miscellaneous route as needed. left ulnar wrist brace 1 each 0    aspirin 81 MG chewable tablet Chew 1 tablet (81 mg total) daily. 36 tablet 11    carvediloL (COREG) 12.5 MG tablet Take 1 tablet (12.5 mg total) by mouth Two (2) times a day. 180 tablet 1    clonazePAM (KLONOPIN) 0.5 MG tablet Take 1 tablet (0.5 mg total) by mouth two (2) times a day as needed for anxiety or sleep. 60 tablet 1    docusate sodium (COLACE) 100 MG capsule Take 1 capsule (100 mg total) by mouth daily.      doxycycline (VIBRA-TABS) 100 MG tablet Take 1 tablet (100 mg total) by mouth Two (2) times a day. 180 tablet 3    famotidine (PEPCID) 20 MG tablet Take 1 tablet (20 mg total) by mouth Two (2) times a day.      gabapentin (NEURONTIN) 300 MG capsule Take 2 capsules (600mg ) by mouth in the morning and 3 capsules (900mg ) at bedtime. May cause drowsiness. (Patient taking differently: 2 capsules (600 mg total) two (2) times a day. Take 2 capsules (600mg ) by mouth in the morning and 2 capsules (600mg ) at bedtime. May cause drowsiness.) 150 capsule 5    HYDROmorphone (DILAUDID) 2 MG tablet Take 1 tablet (2 mg total) by mouth two (2) times a day as needed. 56 tablet 0    mirtazapine (REMERON) 15 MG tablet TAKE 1 TABLET BY MOUTH EVERY DAY AT NIGHT 90 tablet 4    mucus clearing device Devi 1 each by Miscellaneous route Two (2) times a day. 1 each 0    nirmatrelvir-ritonavir (PAXLOVID, EUA,) 300 mg (150 mg x 2)-100 mg tablet See package instructions. 30 tablet 0    nortriptyline (PAMELOR) 50 MG capsule Take 2 capsules (100 mg total) by mouth nightly.      OXYCONTIN 15 mg 12 hr crush resistant ER/CR tablet Take 1 tablet (15 mg total) by mouth Two (2) times a day. 56 tablet 0    PONATinib (ICLUSIG) 15 mg tablet Take 1 tablet (15 mg total) by mouth daily. Swallow tablets whole. Do not crush, break, cut or chew tablets. 30 tablet 5    sulfamethoxazole-trimethoprim (BACTRIM DS) 800-160 mg per tablet TAKE 1 TABLET BY MOUTH 2 TIMES A DAY ON SATURDAY, SUNDAY. FOR PROPHYLAXIS WHILE ON CHEMO. 48 tablet 3    umeclidinium-vilanteroL (ANORO ELLIPTA) 62.5-25 mcg/actuation inhaler Inhale 1 puff daily. 60 each 11    valACYclovir (VALTREX) 500 MG tablet Take 1 tablet (500 mg total) by mouth daily. 90 tablet 11     No current facility-administered medications for this visit.        Changes to medications: Dilon reports no changes at this time.    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    Onion Anaphylaxis    Vancomycin Analogues      Hearing loss with Lasix  Other reaction(s): Other (See Comments)  Hearing loss  lasix    Bismuth Subsalicylate Nausea And Vomiting    Privigen [Immun Glob G(Igg)-Pro-Iga 0-50] Other (See Comments)     03/10/2021 VIG infusion stopped as patient developed rigors, tachycardia and HTN during infusion despite pre-medication; 03/12/21 completed IVIG with premedications and slower rate of infusion    Furosemide      With Vancomycin caused hearing loss  Other reaction(s): Other (See Comments)  With Vancomycin caused hearing loss       Changes to allergies: No    SPECIALTY MEDICATION ADHERENCE     Iclusig 15 mg: 0 days of medicine on hand Medication Adherence    Patient reported X missed doses in the last month: 0  Specialty Medication: Iclusig 15 mg once daily - Dose reduction  Patient is on additional specialty medications: No  Informant: patient  Confirmed plan for next specialty medication refill: delivery by pharmacy  Refills needed for supportive medications: not needed          Specialty medication(s) dose(s) confirmed:  dose reduced from 30 mg daily to 15 mg daily      Are there any concerns with adherence? No    Adherence counseling provided? Not needed    CLINICAL MANAGEMENT AND INTERVENTION      Clinical Benefit Assessment:    Do you feel the medicine is effective or helping your condition? Yes    Clinical Benefit counseling provided? Not needed    Adverse Effects Assessment:    Are you experiencing any side effects? No    Are you experiencing difficulty administering your medicine? No    Quality of Life Assessment:    Quality of Life    Rheumatology  Oncology  Dermatology  Cystic Fibrosis          How many days over the past month did your ALL  keep you from your normal activities? For example, brushing your teeth or getting up in the morning. 0    Have you discussed this with your provider? Not needed    Acute Infection Status:    Acute infections noted within Epic:  MDR Bacteria, COVID-19  Patient reported infection: None    Therapy Appropriateness:    Is therapy appropriate and patient progressing towards therapeutic goals? Yes, therapy is appropriate and should be continued    DISEASE/MEDICATION-SPECIFIC INFORMATION      N/A    PATIENT SPECIFIC NEEDS     Does the patient have any physical, cognitive, or cultural barriers? No    Is the patient high risk? Yes, patient is taking oral chemotherapy. Appropriateness of therapy as been assessed    Does the patient require a Care Management Plan? No     SOCIAL DETERMINANTS OF HEALTH     At the [facility], we have learned that life circumstances - like trouble affording food, housing, utilities, or transportation can affect the health of many of our patients.   That is why we wanted to ask: are you currently experiencing any life circumstances that are negatively impacting your health and/or quality of life? No  Social Determinants of Health     Financial Resource Strain: Low Risk     Difficulty of Paying Living Expenses: Not very hard   Internet Connectivity: Not on file   Food Insecurity: No Food Insecurity    Worried About Programme researcher, broadcasting/film/video in the Last Year: Never true    Barista in the Last Year: Never true   Tobacco Use: High Risk    Smoking Tobacco Use: Every Day    Smokeless Tobacco Use: Former    Passive Exposure: Current   Housing/Utilities: Low Risk     Within the past 12 months, have you ever stayed: outside, in a car, in a tent, in an overnight shelter, or temporarily in someone else's home (i.e. couch-surfing)?: No    Are you worried about losing your housing?: No    Within the past 12 months, have you been unable to get utilities (heat, electricity) when it was really needed?: No   Alcohol Use: Not on file   Transportation Needs: No Transportation Needs    Lack of Transportation (Medical): No    Lack of Transportation (Non-Medical): No   Substance Use: Not on file   Health Literacy: Not on file   Physical Activity: Not on file   Interpersonal Safety: Not on file   Stress: Not on file   Intimate Partner Violence: Not on file   Depression: Not on file   Social Connections: Not on file     Would you be willing to receive help with any of the needs that you have identified today? Not applicable     SHIPPING     Specialty Medication(s) to be Shipped:   Hematology/Oncology: Iclusig    Other medication(s) to be shipped: No additional medications requested for fill at this time     Changes to insurance: No    Delivery Scheduled: Yes, Expected medication delivery date: 11/02/21.     Medication will be delivered via UPS to the confirmed prescription address in [facility].    The patient will receive a drug information handout for each medication shipped and additional FDA Medication Guides as required.  Verified that patient has previously received a Conservation officer, historic buildings and a Surveyor, mining.    The patient or caregiver noted above participated in the development of this care plan and knows that they can request review of or adjustments to the care plan at any time.      All of the patient's questions and concerns have been addressed.    Breck Coons [facility] Specialty Pharmacist

## 2021-10-29 DIAGNOSIS — H906 Mixed conductive and sensorineural hearing loss, bilateral: Principal | ICD-10-CM

## 2021-10-29 MED ORDER — PREDNISONE 10 MG TABLET
ORAL_TABLET | ORAL | 0 refills | 12 days | Status: CP
Start: 2021-10-29 — End: 2021-11-10

## 2021-11-01 NOTE — Unmapped (Signed)
0930 Attempted to reach patient to review COVID positive workflow. Unable to reach due to mail box being full.

## 2021-11-09 ENCOUNTER — Telehealth: Admit: 2021-11-09 | Discharge: 2021-11-10 | Payer: MEDICAID

## 2021-11-09 DIAGNOSIS — F331 Major depressive disorder, recurrent, moderate: Principal | ICD-10-CM

## 2021-11-09 DIAGNOSIS — G4700 Insomnia, unspecified: Principal | ICD-10-CM

## 2021-11-09 DIAGNOSIS — F419 Anxiety disorder, unspecified: Principal | ICD-10-CM

## 2021-11-09 NOTE — Unmapped (Signed)
Comprehensive Cancer Support Program (CCSP) - Psychiatry Outpatient Clinic   After Visit Summary    It was a pleasure to see you today in the [facility]???s Comprehensive Cancer Support Program (CCSP). The CCSP is a multidisciplinary program dedicated to helping patients, caregivers, and families with cancer treatment, recovery and survivorship.      To schedule, cancel, or change your appointment:  Please call the [facility] schedulers at [phone], Monday through Friday 8AM - 5PM.  Someone will return your call within 24 hours.      If you have a question about your medicines or you need to contact your provider:  Please call the CCSP program coordinator, Myrene Galas, at [phone].     For after hours urgent issues, you may call [phone] or call the I need to talk line at [phone] anytime 24/7.    CCSP Patient and Family Resource Center: [phone].    CCSP Website:  [email or web address]    For prescription refills, please allow at least 24 hours (during business hours, M-F) for providers to call in refills to your pharmacy. We are generally unable to accommodate same-day requests for refills.     If you are taking any controlled substances (such as anxiety or sleep medications), you must use them as the directions say to use them. We generally do not provide early refills over the phone without clear reason, and it would be inappropriate to obtain the medications from other doctors. We routinely use the [place] controlled substance database to monitor prescription drug use.

## 2021-11-09 NOTE — Unmapped (Signed)
[facility]  Psychiatry   Established Patient E&M Service - Outpatient       Assessment:    Adam Keith presents for follow-up evaluation. He states that he continues to struggle with depression and anxiety, especially on days where his wife is at work. Currently on a prednisone taper, so he is not sleeping well and it could be the cause of some irritability. Patient no longer using his clonazepam 0.5 mg, but it is available if needed. No medication changes today. Continuing his nortriptyline 100 mg and mirtazapine 15 mg at bedtime. Available as needed for patient. We decided to not schedule a follow up appointment at this time.     Identifying Information:  Adam Keith is a 41 y.o. 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    Risk Assessment:  An assessment of suicide and violence risk factors was performed as part of this evaluation and is not significantly changed from the last visit.   While future psychiatric events cannot be accurately predicted, the patient does not currently require acute inpatient psychiatric care and does not currently meet [facility] involuntary commitment criteria.      Plan:    Problem 1:??Major Depressive Disorder  Status of problem:??chronic??with moderate??exacerbation  Interventions:??  ??? Continue??nortriptyline??100??mg??at bedtime. ??  ??? Continue??mirtazapine 15 mg at bedtime??  ??? Encouraged psychotherapy with local therapist  ??  Problem 2:??Anxiety  Status of problem:??chronic??with moderate exacerbation  Interventions:??  ?? See MDD above  ?? Clonazepam 0.5 mg available PRN. Patient states that he is not currently using this medication. Last filled on 08/03/21 per PDMP review.  ??  Problem 3:??Insomnia  Status of problem:????chronic??with moderate exacerbation  Interventions:??  ??? See mirtazapine plan above      Psychotherapy provided:  No billable psychotherapy service provided.    Patient has been given this writer's contact information as well as the [facility] Psychiatry urgent line number. The patient has been instructed to call 911 for emergencies.        Subjective:    Chief complaint:  Follow-up psychiatric evaluation for depression    Interval History:   States that his depression is touch and go and he explained that he does not feel depressed everyday. On days where his wife is at work, he is usually depressed and anxious. He explained, It doesn't feel like home when she is not here. When he feels depressed, he stressed that it is bad. States that he is overwhelmed with all of the things that he needs to accomplish. Profound fatigue remains an issue. Currently, he is on a prednisone taper and his sleep is suffering, so his mood is not as good overall. Rates his anxiety as moderate. States that he is not interested in making any medication changes. Patient is motivated to eventually get off all of his meds, especially his pain meds. Wants to pursue weaning off of his antidepressants after weaning off of his pain meds. Rarely using his clonazepam, but is glad to have it as needed. Not currently seeing his therapist.     Prior Psychiatric Medication Trials: bupropion (could not tolerate), duloxetine, mirtazapine (given during recent hospitalization), lorazepam    Psychiatric Review of Systems  ??? Depressive Symptoms: depressed mood is intermittent, difficulty concentrating, fatigue, feelings of worthlessness/guilt  ??? Manic Symptoms:??N/A  ??? Psychosis Symptoms:??N/A  ??? Anxiety Symptoms:??difficulty controlling worry at  times, easily fatigued, difficulty concentrating  ??? Panic Symptoms:??denies  ??? PTSD Symptoms:??no recent issues  ??? Sleep disturbance:??currently his sleep is interrupted with steroid use (3 more days left on prednisone taper)  ??? Exercise:??Unable to engage in a daily exercise routine.??      Objective:      Mental Status Exam:  Appearance:    Appears stated age, Well nourished and Clean/Neat   Motor:   No abnormal movements   Speech/Language:    Normal rate, volume, tone, fluency   Mood:   Depressed   Affect:   Blunted, Calm, Cooperative and Depressed   Thought process and Associations:   Logical, linear, clear, coherent, goal directed   Abnormal/psychotic thought content:     Denies SI, HI, self harm, delusions, obsessions, paranoid ideation, or ideas of reference   Perceptual disturbances:     Denies auditory and visual hallucinations, behavior not concerning for response to internal stimuli     Other:   insight and judgment intact     I personally spent 45 minutes face-to-face and non-face-to-face in the care of this patient, which includes all pre, intra, and post visit time on the date of service.    Visit was completed by video (or phone) and the appropriate disclaimer has been included below.    The patient reports they are currently: at home. I spent 30 minutes on the real-time audio and video with the patient on the date of service. I spent an additional 15 minutes on pre- and post-visit activities on the date of service.     The patient was physically located in [place] or a state in which I am permitted to provide care. The patient and/or parent/guardian understood that s/he may incur co-pays and cost sharing, and agreed to the telemedicine visit. The visit was reasonable and appropriate under the circumstances given the patient's presentation at the time.    The patient and/or parent/guardian has been advised of the potential risks and limitations of this mode of treatment (including, but not limited to, the absence of in-person examination) and has agreed to be treated using telemedicine. The patient's/patient's family's questions regarding telemedicine have been answered.     If the visit was completed in an ambulatory setting, the patient and/or parent/guardian has also been advised to contact their provider???s office for worsening conditions, and seek emergency medical treatment and/or call 911 if the patient deems either necessary.      Sheran Lawless, PMHNP  11/09/2021

## 2021-11-11 DIAGNOSIS — G893 Neoplasm related pain (acute) (chronic): Principal | ICD-10-CM

## 2021-11-19 NOTE — Unmapped (Signed)
[facility] Refill Coordination Note    Specialty Medication(s) to be Shipped:   Hematology/Oncology: Iclusig    Other medication(s) to be shipped: No additional medications requested for fill at this time     Adam Keith, DOB: Dec 22, 1980  Phone: [phone] (home)       All above HIPAA information was verified with patient.     Was a Nurse, learning disability used for this call? No    Completed refill call assessment today to schedule patient's medication shipment from the [facility] [phone]).  All relevant notes have been reviewed.     Specialty medication(s) and dose(s) confirmed: Regimen is correct and unchanged.   Changes to medications: Adam Keith reports no changes at this time.  Changes to insurance: No  New side effects reported not previously addressed with a pharmacist or physician: None reported  Questions for the pharmacist: No    Confirmed patient received a Conservation officer, historic buildings and a Surveyor, mining with first shipment. The patient will receive a drug information handout for each medication shipped and additional FDA Medication Guides as required.       DISEASE/MEDICATION-SPECIFIC INFORMATION        N/A    SPECIALTY MEDICATION ADHERENCE     Medication Adherence    Patient reported X missed doses in the last month: 0  Specialty Medication: Iclusig 15 mg  Patient is on additional specialty medications: No  Informant: patient                       Were doses missed due to medication being on hold? No    Iclusig 30 mg: 14 days of medicine on hand       REFERRAL TO PHARMACIST     Referral to the pharmacist: Not needed      [facility]     Shipping address confirmed in Epic.     Delivery Scheduled: Yes, Expected medication delivery date: 11/30/21.     Medication will be delivered via UPS to the prescription address in Epic [place].    Adam Keith Adam Keith Adam Keith   [facility] Specialty Technician

## 2021-11-22 NOTE — Unmapped (Addendum)
IMMUNOCOMPROMISED HOST INFECTIOUS DISEASE PROGRESS NOTE    Assessment/Plan:        The patient reports they are currently: not at home. I spent 14 minutes on the real-time audio and video visit with the patient on the date of service. I spent an additional 20 minutes on pre- and post-visit activities on the date of service.     The patient was not located and I was located within 250 yards of a hospital based location during the real-time audio and video visit. The patient was physically located in [place] or a state in which I am permitted to provide care. The patient and/or parent/guardian understood that s/he may incur co-pays and cost sharing, and agreed to the telemedicine visit. The visit was reasonable and appropriate under the circumstances given the patient's presentation at the time.    The patient and/or parent/guardian has been advised of the potential risks and limitations of this mode of treatment (including, but not limited to, the absence of in-person examination) and has agreed to be treated using telemedicine. The patient's/patient's family's questions regarding telemedicine have been answered.    If the visit was completed in an ambulatory setting, the patient and/or parent/guardian has also been advised to contact their provider???s office for worsening conditions, and seek emergency medical treatment and/or call 911 if the patient deems either necessary.      Mr.Burnett Jamerson Vonbargen is a 41 y.o. 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 krusei fungemia  - Prior chemotherapy:??[id number]-[id number]??induction??with dasatinib??(vincristine, dexamethasone and dasatinib);??C1D1 01/24/2020; difficulty tolerating single-agent dasatinib  - Current chemotherapy:??blinatumomab (anti-CD19/CD3) + ponatinib (TKI)??last in Oct 2022; 01/2021 now on ponatinib monotherapy maintenance  -??01/11/21:??Normocellular bone marrow (30% overall) with trilineage hematopoiesis and less than 1% blasts by manual aspirate differential;??Flow cytometry MRD analysis reveals no definitive immunophenotypic evidence of residual B lymphoblastic leukemia;??BCR-ABL p210 transcripts were detected at a level of 0.002 IS % ratio in bone marrow.  -06/24/21: Peripheral blood - lack of detectable BCR-ABL1 p210 RNA suggests molecular remission of leukemia  -11/2021: elected not to proceed with BMT  ??  # Hypogammaglobulimia  - 11/01/20 IgG 266 declined IVIG  - 03/11/21 IgG 148 - IVIG infusion stopped as patient developed rigors, tachycardia and HTN during infusion despite pre-medication   - 03/12/21 IgG 213 - completed IVIG with premedications and slower rate of infusion  -03/17/21 IgG 632  -06/24/21 IgG 292 - IVIG infusion stopped as patient did not tolerate despite pre-medication  -10/19/21 IgG 321  ??  #??Mild CKD  CrCl CrCl cannot be calculated (Patient's most recent lab result is older than the maximum 28 days allowed.).  ??  Pertinent Co-morbidities  # COPD/emphysema  #??DIHS/DRESS??ceftaroline??06/12/2020, relapse??after cefepime??11/02/2020  - per prior ID notes possible culprits ceftaroline,??posaconazole, dasatinib, sotrovimab  # GERD on omeprazole, 8/2  # Hearing loss; new onset L sided hearing loss, 10/2021    ??   Pertinent Exposure History??  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  Infection History  ??  Active infections:??  #??Recurrent, severe MRSA infections??with intubation; on long-term secondary prophylaxis??since 03/26/21  -??01/27/20 MRSA??bacteremia + PNA +??TV IE??  -??11/01/2020 MRSA??RLL PNA c/b bacteremia, probable??right empyema 11/13/2020  - 11/18/2020??RML??MRSA  pneumonia  - 02/02/2021 Dr. Juliene Pina felt chest pain is appropriate in setting of medically managed empyema and risk of thoracotomy outweighed benefit given clinical symptoms  -??03/05/21??MRSA pneumonia after influenza infection requiring ICU admission/intubation  - 12/11 CT Chest : Multifocal consolidative airspace disease, nonspecific small pleural effusion in the dependent aspect of the left pleural space and trace loculated collections within the lateral aspect of the right chest.  Rx??03/26/21??doxycycline??suppression??->??    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  Antimicrobial Intolerance/allergy  Cefepime - DRESS/DiHS  Ceftaroline -??DRESS/DiHS  Dapsone - possible??agranulocytosis, per chart anaphylaxis  Vancomycin - probable ototoxicity (in combination??with furosemide)  Isavuconazole - elevated LFTs in the setting of TKI  Linezolid - peripheral neuropathy; previously tolerated tedizolid  Aztreonam+linezolid - patient reported ear pain and worsening hearing (in the setting COVID-19)      RECOMMENDATIONS    Management  ??? Continue oral doxycycline 100mg  BID for recurrent MRSA pneumonia/ empyema for chronic suppression given recurrent, severe MRSA infections.  ??? For hearing loss, we d/w patient that he should follow up with ENT re myringotomy/ tympanostomy. Per review of literature did not find aztreonam or linezolid causing hearing loss, but noted this in our allergies above  ??? For  L posterior and R anterior septal deviation, we advised patient that from ID standpoint, benefits of sinus surgery are likely to outweigh the risks.   ??? He is high risk for infection during BMT. After our discussion, he notes that he is not inclined to pursue BMT.    Antimicrobial prophylaxis required for hematological malignancy   ??? COVID 19 vaccine - OK to defer vaccination for at least 3-6 months since patient recently acquired the infection in July 2023.   ??? Patient declined to take the Influenza vaccine this year.    ??? Patient is on TMP-SMZ (prescribed 08/31/21) for PJP ppx.   ??? Continue oral valacyclovir 500mg  daily.     Intensive toxicity monitoring for prescription antimicrobials   ??? clinical assessments for rashes or other skin changes.Patient counseled to apply sunscreen while on doxycycline, however, he declined noting the blisters do not bother him that much.     Follow up 6 months          Recommendations were communicated via shared medical record.    [name], MD  [facility]    Attending telehealth attestation  This was a telehealth service performed by a resident and I was present for the entirety of the via real-time audio and video connection. Immediately after or during the visit, I reviewed with the resident the medical history and the resident's assessment and plan. I discussed with the resident the patient's diagnosis and concur with the treatment plan as documented in the resident note.   [name], MD  Division of Infectious Diseases       Subjective     External record(s): Primary team note: patient prescribed paxlovid for  5 days for COVID in July Consultant note(s): Seen by ENT for L sided hearing loss started on steroids discharge summary 7/18, patient admitted for COVID 19 and left AMA.    Independent historian(s): no independent historian required.       Interval History:   Patient seen via video visit today. He reports that he did not take Paxlovid. He started experiencing L sided hearing loss which he attributes to the IV antibiotics given in the hospital (upon review, he received aztreonam and linezolid while in the hospital in July 2023). Patient has been seen by ENT/ Audiology. He was started on steroids by ENT with recommendation for myringotomy/tympanostomy tube placement. Patient states that he completed steroids and that his energy is better. He is reluctant to proceed with myringotomy/tympanostomy tube placement.     As far as doxycyline, he has been taking it but not applying sunscreen and states that he gets a rash which resolves in a week. We discussed he is not on Bactrim, however his ALC has been WNL and will reach out to the patient's Heme/Onc team re that.       Medications:    Current Outpatient Medications:   ???  amLODIPine (NORVASC) 10 MG tablet, Take 1 tablet (10 mg total) by mouth daily., Disp: 30 tablet, Rfl: 0  ???  arm brace (WRIST BRACE) Misc, 1 Piece by Miscellaneous route as needed. left ulnar wrist brace, Disp: 1 each, Rfl: 0  ???  aspirin 81 MG chewable tablet, Chew 1 tablet (81 mg total) daily., Disp: 36 tablet, Rfl: 11  ???  carvediloL (COREG) 12.5 MG tablet, Take 1 tablet (12.5 mg total) by mouth Two (2) times a day., Disp: 180 tablet, Rfl: 1  ???  clonazePAM (KLONOPIN) 0.5 MG tablet, Take 1 tablet (0.5 mg total) by mouth two (2) times a day as needed for anxiety or sleep., Disp: 60 tablet, Rfl: 1  ???  docusate sodium (COLACE) 100 MG capsule, Take 1 capsule (100 mg total) by mouth daily., Disp: , Rfl:   ???  doxycycline (VIBRA-TABS) 100 MG tablet, Take 1 tablet (100 mg total) by mouth Two (2) times a day., Disp: 180 tablet, Rfl: 3  ???  famotidine (PEPCID) 20 MG tablet, Take 1 tablet (20 mg total) by mouth Two (2) times a day., Disp: , Rfl:   ???  gabapentin (NEURONTIN) 300 MG capsule, Take 2 capsules (600mg ) by mouth in the morning and 3 capsules (900mg ) at bedtime. May cause drowsiness. (Patient taking differently: 2 capsules (600 mg total) two (2) times a day. Take 2 capsules (600mg ) by mouth in the morning and 2 capsules (600mg ) at bedtime. May cause drowsiness.), Disp: 150 capsule, Rfl: 5  ???  HYDROmorphone (DILAUDID) 2 MG tablet, Take 1 tablet (2 mg total) by mouth two (2) times a day as needed., Disp: 56 tablet, Rfl: 0  ???  mirtazapine (REMERON) 15 MG tablet, TAKE 1 TABLET BY MOUTH EVERY DAY AT NIGHT, Disp: 90 tablet, Rfl: 4  ???  mucus clearing device Devi, 1 each by Miscellaneous route Two (2) times a day., Disp: 1 each, Rfl: 0  ???  nortriptyline (PAMELOR) 50 MG capsule, Take 2 capsules (100 mg total) by mouth nightly., Disp: , Rfl:   ???  OXYCONTIN 15 mg 12 hr crush resistant ER/CR tablet, Take 1 tablet (15 mg total) by mouth Two (2) times a day., Disp: 56 tablet, Rfl: 0  ???  PONATinib (ICLUSIG) 15 mg tablet, Take 1 tablet (15 mg total) by  mouth daily. Swallow tablets whole. Do not crush, break, cut or chew tablets., Disp: 30 tablet, Rfl: 5  ???  sulfamethoxazole-trimethoprim (BACTRIM DS) 800-160 mg per tablet, TAKE 1 TABLET BY MOUTH 2 TIMES A DAY ON SATURDAY, SUNDAY. FOR PROPHYLAXIS WHILE ON CHEMO., Disp: 48 tablet, Rfl: 3  ???  umeclidinium-vilanteroL (ANORO ELLIPTA) 62.5-25 mcg/actuation inhaler, Inhale 1 puff daily., Disp: 60 each, Rfl: 11  ???  valACYclovir (VALTREX) 500 MG tablet, Take 1 tablet (500 mg total) by mouth daily., Disp: 90 tablet, Rfl: 11    Objective     Vital signs:  There were no vitals taken for this visit.    Physical Exam:  Const [x]  vital signs above    [x]  NAD, non-toxic appearance []  Chronically ill-appearing, non-distressed  Ambulating outside      Eyes []  Lids normal bilaterally, conjunctiva anicteric and noninjected OU     [] PERRL  [] EOMI        ENMT []  Normal appearance of external nose and ears, no nasal discharge        []  MMM, no lesions on lips or gums []  No thrush, leukoplakia, oral lesions  []  Dentition good []  Edentulous []  Dental caries present  []  Hearing normal  []  TMs with good light reflexes bilaterally   Wearing sunglasses and a hat      Neck []  Neck of normal appearance and trachea midline        []  No thyromegaly, nodules, or tenderness   []  Full neck ROM        Lymph []  No LAD in neck     []  No LAD in supraclavicular area     []  No LAD in axillae   []  No LAD in epitrochlear chains     []  No LAD in inguinal areas        CV []  RRR            []  No peripheral edema     []  Pedal pulses intact   []  No abnormal heart sounds appreciated   []  Extremities WWP         Resp [x]  Normal WOB at rest    [x]  No breathlessness with speaking, no coughing  []  CTA anteriorly    []  CTA posteriorly          GI []  Normal inspection, NTND   []  NABS     []  No umbilical hernia on exam       []  No hepatosplenomegaly     []  Inspection of perineal and perianal areas normal        GU []  Normal external genitalia     [] No urinary catheter present in urethra   []  No CVA tenderness    []  No tenderness over renal allograft        MSK []  No clubbing or cyanosis of hands       []  No vertebral point tenderness  []  No focal tenderness or abnormalities on palpation of joints in RUE, LUE, RLE, or LLE        Skin []  No rashes, lesions, or ulcers of visualized skin     []  Skin warm and dry to palpation         Neuro []  Face expression symmetric  []  Sensation to light touch grossly intact throughout    []  Moves extremities equally    []  No tremor noted        []  CNs II-XII grossly intact     []  DTRs normal  and symmetric throughout []  Gait unremarkable        Psych [x]  Appropriate affect       [x]  Fluent speech         [x]  Attentive, good eye contact  [x]  Oriented to person, place, time          [x]  Judgment and insight are appropriate           Data for Medical Decision Making     I discussed not done.    I reviewed CBC results (ALC is WNL).    I independently visualized/interpreted not done. No results found for requested labs within last 30 days.       Imaging:  10/28/21 CXR - Residual abnormalities right apex improved since January 31 without much change since May 26. The appearance is consistent with postinflammatory scarring as noted on CT June 21.    Additional Studies:   (10/19/21) EKG QTcF 407 ms

## 2021-11-23 ENCOUNTER — Telehealth
Admit: 2021-11-23 | Discharge: 2021-11-24 | Payer: MEDICAID | Attending: Infectious Disease | Primary: Infectious Disease

## 2021-11-23 DIAGNOSIS — Z79899 Other long term (current) drug therapy: Principal | ICD-10-CM

## 2021-11-23 DIAGNOSIS — J869 Pyothorax without fistula: Principal | ICD-10-CM

## 2021-11-23 NOTE — Unmapped (Signed)
Pt confirmed PHONE visit and meds

## 2021-11-29 MED FILL — ICLUSIG 15 MG TABLET: ORAL | 30 days supply | Qty: 30 | Fill #1

## 2021-12-01 NOTE — Unmapped (Unsigned)
[facility]    Patient Name: Adam Keith  Patient Age: 41 y.o.  Encounter Date: 12/02/2021    Primary Care Provider:  Dierdre Harness, MD    Referring Physician:  Referring, Unknown Per Patient  No address on file    Reason for visit:  Ph+ B-ALL follow up     Assessment:  A 41 y.o. 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 The course was complicated by septic shock, candida krusei fungemia, MRSA bacteremia with septic emboli c/b acute renal failure and respiratory distress requiring dialysis and intubation.  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  He subsequently had difficulty tolerating single agent dasatinib (as a bridge to planned ponatinib-blinatumomab--had fluid retention and pleural effusion).  He then completed  4 cycles of ponatinib-blinatumomab, initiated for treatment of MRD in the setting of poor tolerance of standard therapy.    Patient is currently on ponatinib 30 mg monotherapy (since 01/2021).  Most recent BCR-ABL is negative and the blood, the positive in the bone marrow. He is overall stable.  Energy is still low. He decided against transplant. In the meantime our recommendation is to continue ponatinib monotherapy and monitor BCR/ABL (pending today).    Update: He now has BCR-ABL positive from the blood (0.002%).    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   - No plan for further IVIG given several reactions     DRESS: followed by Dr. 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 Now s/p dialysis  - stable Cr appears to have developed CKD due to incompletely healed acute injury.  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 [name] and Dr. Verlan Friends and Dr. Maple Mirza following patient.    - long term doxycycline with goal of decolonization  **Hx Candidemia during induction - now s/p Cresemab  - followed by Dr. [name]  **Hx Covid-19   - symptoms now resolved    Symptom management: bowel regimen as listed in medication list, analgesics as listed in medication list or symptoms reviewed and due to acceptable control, no changes made  **Peripheral neuropathy, sensory and motor  - Grade 1-2 monitor  **Chronic Back Pain, complicated by hospital stay and procedures  - Followed by Dr. Genice Rouge, Palliative Care  **Depression/anxiety: met with [name], [facility]  - nortriptyline  - PRN clonazepam  - has not been using PRN hydroxyzine    Venous access: I recommend the following change for venous access : PICC line removal      Supportive Care Recommendations:  We recommend based  on the patient???s underlying diagnosis and treatment history the following supportive care:      1. Antimicrobial prophylaxis:    - Viral - valacyclovir 500mg  daily  - Bacterial - levofloxacin 500 mg if ANC <0.5  - Fungal - on treatment isavuconazole  - PJP - Bactrim DS BID Sat/Sun  - recommend Annual flu shot - declines thus far  - recommend updated Covid vaccine - declines    2. Blood product support:  I recommend the following intervals for laboratory monitoring to determine transfusion needs and monitor for hematologic effects of therapy and underlying disease: no routine monitoring outside of clinic follow-up    Leukoreduced blood products are required.  Irradiated blood products are preferred, but in case of urgent transfusion needs non-irradiated blood products may be used:     -  RBC transfusion threshold: transfuse 2 units for Hgb < 8 g/dL.  -  Platelet transfusion threshold: transfuse 1 unit of platelets for platelet count < 10, or for bleeding or need for invasive procedure.    3. Hematopoietic growth factor support: none      Care coordination: The next [facility] Follow up requested:  - 3 month followup      These services include the following elements of medical decision making:  addressing a hematologic cancer that poses a threat to life and/or bone marrow function  interpretation of diagnostic test reports or ordering of diagnostic tests and independent interpretation of diagnostic tests  High risk of morbidity due to drug therapy requiring monitoring for toxicity or decisions regarding goals and continuation of antineoplastic therapy      Mariel Aloe, MD  Leukemia Program  Division of Hematology  [facility]     Nurse Navigator (non-clinical trial patients): Elicia Lamp, RN        Tel. [phone]       Fax. [phone]  Toll-free appointments: [phone]  Scheduling assistance: [phone]  After hours/weekends: [phone] (ask for adult hematology/oncology on-call)            History of Present Illness:  We had the pleasure of seeing Adam Keith in the Leukemia Clinic at the [place] [facility] [place] on 12/02/2021.  He is a 41 y.o. male with  Ph+ B-ALL .          His oncologic history is as follows:      Oncology History Overview Note   Referring/Local Oncologist: None    Diagnosis:Ph+ ALL    Genetics:    Karyotype/FISH:Abnormal Karyotype: 46,XY,t(9;22)(q34;q11.2)[1]/45,XY,der(7;9)(q10;q10)t(9;22)(q34;q11.2),der(22)t(9;22)[11]/46,sdl,+der(22)t(9;22)[5]/46,XY[3] Abnormal FISH: A BCR/ABL1 interphase FISH assay shows an abnormal signal pattern in 97% of the 100 cells scored. 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  The findings support a diagnosis of leukemia and have implications for targeted therapy and for monitoring residual disease.      Molecular Genetics:  BCR-ABL1 p210 transcripts were detected at a level of 46.479 IS% ratio in bone marrow.  BCR-ABL1 p190 transcripts were detected at a level of 4 in 100,000 cells in bone marrow.    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)     01/24/2020 - 04/02/2020 Chemotherapy    IP/OP  LEUKEMIA GRAAPH-2005 + RITUXIMAB < 60 YO  rituximab hypercvad (odd and even course)     02/24/2020 Remission    CR with PCR MRD+; marrow showing 70% blasts with <1% blasts, negative flow MRD, BCR-ABL p210 PCR 0.197%; CNS negative for blasts     03/09/2020 Progression    CSF - rare blast ID'ed - IT chemo given    03/13/20 - IT chemo, CSF negative     04/16/2020 Biopsy    BM Bx with 40-50% cellularity, <1% blasts, MRD flow cytometry negative, BCR-ABL p210 transcripts 0.036%     05/28/2020 - 05/28/2020 Chemotherapy    OP AML - CNS THERAPY (INTRATHECAL CYTARABINE, INTRATHECAL METHOTREXATE, OR INTRATHECAL TRIPLE)  Select one of the following: cytarabine IT 100 mg with hydrocortisone 50 mg, cytarabine IT 40 mg with methotrexate 15 mg with hydrocortisone 50 mg, OR methotrexate IT 12 mg with hydrocortisone 50 mg     06/19/2020 -  Chemotherapy    IP/OP LEUKEMIA BLINATUMOMAB 7-DAY INFUSION (MINIMAL RESIDUAL DISEASE; WT >= 22 KG) (HOME INFUSION)      Cycles 1*-4: Blinatumomab 28 mcg/day Days 1-28 of 6-week cycle.  *Given in the inpatient setting on Days 1-3 on Cycle 1 and Days 1-2 on Cycle 2, while other treatment days are given in the outpatient setting.    Cycle 1 MRD Dosing     07/18/2020 Adverse Reaction    Hospitalization: fevers. Pneumonia     07/23/2020 Biopsy    Diagnosis  Bone marrow, right iliac, aspiration and biopsy  -   Normocellular bone marrow (50%) with trilineage hematopoiesis and 1% blasts by manual aspirate differential  -   Flow cytometry MRD analysis reveals no definitive immunophenotypic evidence of residual B lymphoblastic leukemia   - BCR-ABL p210 0.006%         08/10/2020 -  Chemotherapy    Cycle 2 blinatumomab-ponatinib  Ponatinib 30mg     1 IT per cycle     09/21/2020 Adverse Reaction    Covid-19, symptomatic but not requiring hospitalization.     10/06/2020 -  Chemotherapy    Cycle 3 blinatumomab-ponatinib  Ponatinib 30mg        11/01/2020 Adverse Reaction    Hospitalization: MRSA Pneumonia requiring intubation    Blinatumomab stopped.  Ponatinib held until he stabilized.       12/14/2020 -  Chemotherapy    Cycle 4 blinatumomab 28 mcg/day days 1-28, ponatinib 30 mg per day IT triple therapy day 29     01/13/2021 -  Chemotherapy    Ponatinib 30mg  daily  - due to due low level BCR-ABL     03/06/2021 Adverse Reaction    Hospitalization - ICU on ventilator - influenza and MRSA/klebsiella PNA     03/10/2022 -  Chemotherapy    OP LEUKEMIA VINCRISTINE  vinCRIStine 2 mg IV on day 1     ALL (acute lymphoblastic leukemia) (CMS-HCC)   06/11/2020 -  Chemotherapy    IP/OP LEUKEMIA BLINATUMOMAB 7-DAY INFUSION (MINIMAL RESIDUAL DISEASE; WT >= 22 KG) (HOME INFUSION)  Cycles 1*-4: Blinatumomab 28 mcg/day Days 1-28 of 6-week cycle.  *Given in the inpatient setting on Days 1-3 on Cycle 1 and Days 1-2 on Cycle 2, while other treatment days are given in the outpatient setting.     09/21/2020 Initial Diagnosis    ALL (acute lymphoblastic leukemia) (CMS-HCC)     03/10/2022 -  Chemotherapy    OP LEUKEMIA VINCRISTINE  vinCRIStine 2 mg IV on day 1     Acute lymphoblastic leukemia  in remission (CMS-HCC)   06/11/2020 -  Chemotherapy    IP/OP LEUKEMIA BLINATUMOMAB 7-DAY INFUSION (MINIMAL RESIDUAL DISEASE; WT >= 22 KG) (HOME INFUSION)  Cycles 1*-4: Blinatumomab 28 mcg/day Days 1-28 of 6-week cycle.  *Given in the inpatient setting on Days 1-3 on Cycle 1 and Days 1-2 on Cycle 2, while other treatment days are given in the outpatient setting.     11/09/2020 Initial Diagnosis    Acute lymphoblastic leukemia in remission (CMS-HCC)     03/10/2022 -  Chemotherapy    OP LEUKEMIA VINCRISTINE  vinCRIStine 2 mg IV on day 1         Interim History:  Doing okay.  Wants to go back to work.  Energy still low, though improving.  Breathing continues to fluctuate.      Past Medical, Surgical and Family History were reviewed and pertinent updates were made in the Electronic Medical Record      ECOG Performance Status: 1    Medications:      Current Outpatient Medications   Medication Sig Dispense Refill    amLODIPine (NORVASC) 10 MG tablet Take 1 tablet (10 mg total) by mouth daily. 30 tablet 0    arm brace (WRIST BRACE) Misc 1 Piece by Miscellaneous route as needed. left ulnar wrist brace 1 each 0    aspirin 81 MG chewable tablet Chew 1 tablet (81 mg total) daily. 36 tablet 11    carvediloL (COREG) 12.5 MG tablet Take 1 tablet (12.5 mg total) by mouth Two (2) times a day. 180 tablet 1    docusate sodium (COLACE) 100 MG capsule Take 1 capsule (100 mg total) by mouth daily.      doxycycline (VIBRA-TABS) 100 MG tablet Take 1 tablet (100 mg total) by mouth Two (2) times a day. 180 tablet 3    gabapentin (NEURONTIN) 300 MG capsule Take 2 capsules (600mg ) by mouth in the morning and 3 capsules (900mg ) at bedtime. May cause drowsiness. (Patient taking differently: 2 capsules (600 mg total) two (2) times a day. Take 1 capsules (600mg ) by mouth in the morning and 2 capsules (600mg ) at bedtime. May cause drowsiness.) 150 capsule 5    mirtazapine (REMERON) 15 MG tablet TAKE 1 TABLET BY MOUTH EVERY DAY AT NIGHT 90 tablet 4    mucus clearing device Devi 1 each by Miscellaneous route Two (2) times a day. 1 each 0    nortriptyline (PAMELOR) 50 MG capsule Take 2 capsules (100 mg total) by mouth nightly.      OXYCONTIN 15 mg 12 hr crush resistant ER/CR tablet Take 1 tablet (15 mg total) by mouth Two (2) times a day. (Patient taking differently: Take 1 tablet (15 mg total) by mouth nightly.) 56 tablet 0    sulfamethoxazole-trimethoprim (BACTRIM DS) 800-160 mg per tablet TAKE 1 TABLET BY MOUTH 2 TIMES A DAY ON SATURDAY, SUNDAY. FOR PROPHYLAXIS WHILE ON CHEMO. 48 tablet 3    umeclidinium-vilanteroL (ANORO ELLIPTA) 62.5-25 mcg/actuation inhaler Inhale 1 puff daily. 60 each 11    apixaban (ELIQUIS DVT-PE TREAT 30D START) 5 mg (74 tabs) DsPk Take 2 tablets (10 mg total) by mouth twice daily for 7 days, then take 1 tablet (5 mg) by mouth twice daily 70 tablet 1    omeprazole (PRILOSEC) 20 MG capsule Take 1 capsule (20 mg total) by mouth daily.      PONATinib (ICLUSIG) 30 mg tablet Take 1 tablet (30 mg total) by mouth daily. Swallow tablets whole. Do not crush, break,  cut or chew tablets. 30 tablet 5    valACYclovir (VALTREX) 500 MG tablet Take 1 tablet (500 mg total) by mouth daily. 90 tablet 11     No current facility-administered medications for this visit.       Vital Signs:  Vitals:    12/02/21 0822   BP: 132/88   Pulse: 85   Resp: 18   Temp: 36.6 ??C (97.8 ??F)   SpO2: 99%         Relevant Physical Exam Findings:  General: Resting in no apparent distress, accompanied by spouse  HEENT:  Clear sclera, conjunctiva, mask in place  CARDAC: WWP  RESP: nonlabored  GI: non-distended  NEURO: alert, and oriented x4, steady gait, no focal deficits  PSYCH: appropriate  DERM: no visible rashes, lesions        Relevant Laboratory, radiology and pathology results:  I personally viewed the most recent internal records, laboratory results and hematopathology report  and discussed the available results with the patient and family  A summary of results follows:  Lab on 12/02/2021   Component Date Value Ref Range Status    Specimen Type 12/02/2021    Final                    Value:Blood    BCR::ABL1 p210 Assay 12/02/2021 Positive   Final    BCR::ABL1 p210 IS% Ratio 12/02/2021 0.002  <0.001 IS% Ratio Final    BCR/ABL1 p210 Assay Results 12/02/2021    Final                    Value:This result contains rich text formatting which cannot be displayed here.    Sodium 12/02/2021 142  135 - 145 mmol/L Final    Potassium 12/02/2021 4.1  3.4 - 4.8 mmol/L Final    Chloride 12/02/2021 108 (H)  98 - 107 mmol/L Final    CO2 12/02/2021 24.0  20.0 - 31.0 mmol/L Final    Anion Gap 12/02/2021 10  5 - 14 mmol/L Final    BUN 12/02/2021 12  9 - 23 mg/dL Final    Creatinine 72/53/6644 1.69 (H)  0.60 - 1.10 mg/dL Final    BUN/Creatinine Ratio 12/02/2021 7   Final    eGFR CKD-EPI (2021) Male 12/02/2021 51 (L)  >=60 mL/min/1.43m2 Final    eGFR calculated with CKD-EPI 2021 equation in accordance with [employer] and [employer] of Nephrology Task Force recommendations.    Glucose 12/02/2021 133  70 - 179 mg/dL Final    Calcium 03/47/4259 8.6 (L)  8.7 - 10.4 mg/dL Final    Albumin 56/38/7564 3.8  3.4 - 5.0 g/dL Final    Total Protein 12/02/2021 6.1  5.7 - 8.2 g/dL Final    Total Bilirubin 12/02/2021 0.3  0.3 - 1.2 mg/dL Final    AST 33/29/5188 19  <=34 U/L Final    ALT 12/02/2021 24  10 - 49 U/L Final    Alkaline Phosphatase 12/02/2021 90  46 - 116 U/L Final    WBC 12/02/2021 8.5  3.6 - 11.2 10*9/L Final    RBC 12/02/2021 5.48  4.26 - 5.60 10*12/L Final    HGB 12/02/2021 16.0  12.9 - 16.5 g/dL Final    HCT 41/66/0630 47.6  39.0 - 48.0 % Final    MCV 12/02/2021 86.9  77.6 - 95.7 fL Final    MCH 12/02/2021 29.1  25.9 - 32.4 pg Final    MCHC 12/02/2021 33.5  32.0 - 36.0 g/dL Final    RDW 29/56/2130 16.0 (H)  12.2 - 15.2 % Final    MPV 12/02/2021 8.6  6.8 - 10.7 fL Final    Platelet 12/02/2021 218  150 - 450 10*9/L Final    Neutrophils % 12/02/2021 63.8  % Final    Lymphocytes % 12/02/2021 24.0  % Final    Monocytes % 12/02/2021 5.6  % Final    Eosinophils % 12/02/2021 5.2  % Final    Basophils % 12/02/2021 1.4  % Final    Absolute Neutrophils 12/02/2021 5.4  1.8 - 7.8 10*9/L Final    Absolute Lymphocytes 12/02/2021 2.0  1.1 - 3.6 10*9/L Final    Absolute Monocytes 12/02/2021 0.5  0.3 - 0.8 10*9/L Final    Absolute Eosinophils 12/02/2021 0.4  0.0 - 0.5 10*9/L Final    Absolute Basophils 12/02/2021 0.1  0.0 - 0.1 10*9/L Final    Total IgG 12/02/2021 284 (L)  646 - 2,013 mg/dL Final     Chest X-ray PA and lateral  Increased interstitial edema versus atypical infection/inflammation.     Small right pleural effusion.    Diagnosis   Date Value Ref Range Status   03/16/2021   Final    A. Lung, right upper lobe, bronchoalveolar lavage:  - No cytologically malignant cells identified  - Abundant neutrophils present    This electronic signature is attestation that the pathologist personally reviewed the submitted material(s) and the final diagnosis reflects that evaluation.       Diagnosis   Date Value Ref Range Status   03/16/2021   Final    A. Lung, right upper lobe, bronchoalveolar lavage:  - No cytologically malignant cells identified  - Abundant neutrophils present    This electronic signature is attestation that the pathologist personally reviewed the submitted material(s) and the final diagnosis reflects that evaluation.     01/11/2021   Final    A:  Cerebrospinal fluid, cytospin review and flow cytometry   -  No blasts identified by morphology or flow cytometric analysis    This electronic signature is attestation that the pathologist personally reviewed the submitted material(s) and the final diagnosis reflects that evaluation.     01/11/2021   Final    Bone marrow, left iliac, aspiration and biopsy  -  Normocellular bone marrow (30% overall) with trilineage hematopoiesis and less than 1% blasts by manual aspirate differential  -   Flow cytometry MRD analysis reveals no definitive immunophenotypic evidence of residual B lymphoblastic leukemia (see Comment)     -  See linked reports for associated Ancillary Studies.      This electronic signature is attestation that the pathologist personally reviewed the submitted material(s) and the final diagnosis reflects that evaluation. neutrophils present    This electronic signature is attestation that the pathologist personally reviewed the submitted material(s) and the final diagnosis reflects that evaluation.       Diagnosis   Date Value Ref Range Status   03/16/2021   Final    A. Lung, right upper lobe, bronchoalveolar lavage:  - No cytologically malignant cells identified  - Abundant neutrophils present    This electronic signature is attestation that the pathologist personally reviewed the submitted material(s) and the final diagnosis reflects that evaluation.     01/11/2021   Final    A:  Cerebrospinal fluid, cytospin review and flow cytometry   -  No blasts identified by morphology or flow cytometric analysis  This electronic signature is attestation that the pathologist personally reviewed the submitted material(s) and the final diagnosis reflects that evaluation.     01/11/2021   Final    Bone marrow, left iliac, aspiration and biopsy  -  Normocellular bone marrow (30% overall) with trilineage hematopoiesis and less than 1% blasts by manual aspirate differential  -   Flow cytometry MRD analysis reveals no definitive immunophenotypic evidence of residual B lymphoblastic leukemia (see Comment)     -  See linked reports for associated Ancillary Studies.      This electronic signature is attestation that the pathologist personally reviewed the submitted material(s) and the final diagnosis reflects that evaluation.

## 2021-12-02 ENCOUNTER — Ambulatory Visit: Admit: 2021-12-02 | Discharge: 2021-12-02 | Payer: MEDICAID | Attending: Hematology | Primary: Hematology

## 2021-12-02 ENCOUNTER — Ambulatory Visit: Admit: 2021-12-02 | Discharge: 2021-12-02 | Payer: MEDICAID

## 2021-12-02 ENCOUNTER — Other Ambulatory Visit: Admit: 2021-12-02 | Discharge: 2021-12-02 | Payer: MEDICAID

## 2021-12-02 DIAGNOSIS — C9100 Acute lymphoblastic leukemia not having achieved remission: Principal | ICD-10-CM

## 2021-12-02 LAB — COMPREHENSIVE METABOLIC PANEL
ALBUMIN: 3.8 g/dL (ref 3.4–5.0)
ALKALINE PHOSPHATASE: 90 U/L (ref 46–116)
ALT (SGPT): 24 U/L (ref 10–49)
ANION GAP: 10 mmol/L (ref 5–14)
AST (SGOT): 19 U/L (ref <=34)
BILIRUBIN TOTAL: 0.3 mg/dL (ref 0.3–1.2)
BLOOD UREA NITROGEN: 12 mg/dL (ref 9–23)
BUN / CREAT RATIO: 7
CALCIUM: 8.6 mg/dL — ABNORMAL LOW (ref 8.7–10.4)
CHLORIDE: 108 mmol/L — ABNORMAL HIGH (ref 98–107)
CO2: 24 mmol/L (ref 20.0–31.0)
CREATININE: 1.69 mg/dL — ABNORMAL HIGH
EGFR CKD-EPI (2021) MALE: 51 mL/min/{1.73_m2} — ABNORMAL LOW (ref >=60)
GLUCOSE RANDOM: 133 mg/dL (ref 70–179)
POTASSIUM: 4.1 mmol/L (ref 3.4–4.8)
PROTEIN TOTAL: 6.1 g/dL (ref 5.7–8.2)
SODIUM: 142 mmol/L (ref 135–145)

## 2021-12-02 LAB — CBC W/ AUTO DIFF
BASOPHILS ABSOLUTE COUNT: 0.1 10*9/L (ref 0.0–0.1)
BASOPHILS RELATIVE PERCENT: 1.4 %
EOSINOPHILS ABSOLUTE COUNT: 0.4 10*9/L (ref 0.0–0.5)
EOSINOPHILS RELATIVE PERCENT: 5.2 %
HEMATOCRIT: 47.6 % (ref 39.0–48.0)
HEMOGLOBIN: 16 g/dL (ref 12.9–16.5)
LYMPHOCYTES ABSOLUTE COUNT: 2 10*9/L (ref 1.1–3.6)
LYMPHOCYTES RELATIVE PERCENT: 24 %
MEAN CORPUSCULAR HEMOGLOBIN CONC: 33.5 g/dL (ref 32.0–36.0)
MEAN CORPUSCULAR HEMOGLOBIN: 29.1 pg (ref 25.9–32.4)
MEAN CORPUSCULAR VOLUME: 86.9 fL (ref 77.6–95.7)
MEAN PLATELET VOLUME: 8.6 fL (ref 6.8–10.7)
MONOCYTES ABSOLUTE COUNT: 0.5 10*9/L (ref 0.3–0.8)
MONOCYTES RELATIVE PERCENT: 5.6 %
NEUTROPHILS ABSOLUTE COUNT: 5.4 10*9/L (ref 1.8–7.8)
NEUTROPHILS RELATIVE PERCENT: 63.8 %
PLATELET COUNT: 218 10*9/L (ref 150–450)
RED BLOOD CELL COUNT: 5.48 10*12/L (ref 4.26–5.60)
RED CELL DISTRIBUTION WIDTH: 16 % — ABNORMAL HIGH (ref 12.2–15.2)
WBC ADJUSTED: 8.5 10*9/L (ref 3.6–11.2)

## 2021-12-02 LAB — IGG: GAMMAGLOBULIN; IGG: 284 mg/dL — ABNORMAL LOW (ref 646–2013)

## 2021-12-02 NOTE — Unmapped (Signed)
Continue the ponatinib and aspirin daily.    Continue with the doxycycline, bactrim, valtrex.     I will update you with the BCR/ABL level once it is available.    Please return in 3 months for a follow up visit and labs.    Please call us if you experience:    1. Fever of 100.5 F or higher, shaking chills, drenching night sweats.  2. Any rapidly enlarging lymph node or mass  3. Unintentional weight loss  4. Any other concerning symptom     MyChart Messages  For your safety and best care, please DO NOT use MyChart messages to report symptoms. (Symptoms should be reported by calling the nurse triage line). Please use MyChart for non-urgent matters such as general questions, non-urgent prescription refills, or non-urgent scheduling issues.     ?? Please do not use MyChart for URGENT messages, as messages are only checked during regular business hours.     ?? Please note that MyChart messages may be routed a central pool and one of your provider???s team members will get back to you.  - Expect up to 3 business days for response     If you have any other questions, please do not hesitate to contact us.    Nurse Navigator: Covering hematologic malignancies navigator  Nurse Practitioner: [name]    For health related questions Monday through Friday 8 AM-- 5 PM : please call the office at [phone] and ask to speak with a nurse.  For appointment changes call: Main Clinic [phone].  Toll free number is [phone].    On Nights, Weekends and Holidays:  Call [phone] and ask for the adult hematologist/oncologist on call.      [facility]  [address]  [place], [place] [address]  [email or web address]    Results for orders placed or performed in visit on 12/02/21   Comprehensive Metabolic Panel   Result Value Ref Range    Sodium 142 135 - 145 mmol/L    Potassium 4.1 3.4 - 4.8 mmol/L    Chloride 108 (H) 98 - 107 mmol/L    CO2 24.0 20.0 - 31.0 mmol/L    Anion Gap 10 5 - 14 mmol/L    BUN 12 9 - 23 mg/dL Creatinine 2.59 (H) 5.63 - 1.10 mg/dL    BUN/Creatinine Ratio 7     eGFR CKD-EPI (2021) Male 51 (L) >=60 mL/min/1.71m2    Glucose 133 70 - 179 mg/dL    Calcium 8.6 (L) 8.7 - 10.4 mg/dL    Albumin 3.8 3.4 - 5.0 g/dL    Total Protein 6.1 5.7 - 8.2 g/dL    Total Bilirubin 0.3 0.3 - 1.2 mg/dL    AST 19 <=87 U/L    ALT 24 10 - 49 U/L    Alkaline Phosphatase 90 46 - 116 U/L   CBC w/ Differential   Result Value Ref Range    WBC 8.5 3.6 - 11.2 10*9/L    RBC 5.48 4.26 - 5.60 10*12/L    HGB 16.0 12.9 - 16.5 g/dL    HCT 56.4 33.2 - 95.1 %    MCV 86.9 77.6 - 95.7 fL    MCH 29.1 25.9 - 32.4 pg    MCHC 33.5 32.0 - 36.0 g/dL    RDW 88.4 (H) 16.6 - 15.2 %    MPV 8.6 6.8 - 10.7 fL    Platelet 218 150 - 450 10*9/L    Neutrophils % 63.8 %  Lymphocytes % 24.0 %    Monocytes % 5.6 %    Eosinophils % 5.2 %    Basophils % 1.4 %    Absolute Neutrophils 5.4 1.8 - 7.8 10*9/L    Absolute Lymphocytes 2.0 1.1 - 3.6 10*9/L    Absolute Monocytes 0.5 0.3 - 0.8 10*9/L    Absolute Eosinophils 0.4 0.0 - 0.5 10*9/L    Absolute Basophils 0.1 0.0 - 0.1 10*9/L     *Note: Due to a large number of results and/or encounters for the requested time period, some results have not been displayed. A complete set of results can be found in Results Review.

## 2021-12-03 NOTE — Unmapped (Signed)
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 He recently had a Covid infection, held ponatinib 30 mg daily while he was on Paxlovid, and then restarted at a reduced dose of 15 mg daily. No symptoms or side effects noted with ponatinib. Also taking baby aspirin. He has been following with ICID, and is on BID doxycycline due to his history of MRSA PNA. Follows with supportive care for symptom management. Patient discusses that he's trying to build muscle, inquires about safety of protein powder.     Labs: on CBC, Hgb 16, PLT 218, ANC 5.4. CMP notable for creatinine 1.69 (stable, history of CKD)  Low IgG in past, infusion reactions to IVIG  BP 132/88, HR 85  Last BCR-ABL negative 5/30, pending from today    Oncologic History:  Oncology History Overview Note   Referring/Local Oncologist: None    Diagnosis:Ph+ ALL    Genetics:    Karyotype/FISH:Abnormal Karyotype: 46,XY,t(9;22)(q34;q11.2)[1]/45,XY,der(7;9)(q10;q10)t(9;22)(q34;q11.2),der(22)t(9;22)[11]/46,sdl,+der(22)t(9;22)[5]/46,XY[3]     Abnormal FISH: A BCR/ABL1 interphase FISH assay shows an abnormal signal pattern in 97% of the 100 cells scored. 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  The findings support a diagnosis of leukemia and have implications for targeted therapy and for monitoring residual disease.      Molecular Genetics:  BCR-ABL1 p210 transcripts were detected at a level of 46.479 IS% ratio in bone marrow.  BCR-ABL1 p190 transcripts were detected at a level of 4 in 100,000 cells in bone marrow.    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)     01/24/2020 - 04/02/2020 Chemotherapy    IP/OP LEUKEMIA GRAAPH-2005 + RITUXIMAB < 60 YO  rituximab hypercvad (odd and even course)     02/24/2020 Remission    CR with PCR MRD+; marrow showing 70% blasts with <1% blasts, negative flow MRD, BCR-ABL p210 PCR 0.197%; CNS negative for blasts     03/09/2020 Progression    CSF - rare blast ID'ed - IT chemo given    03/13/20 - IT chemo, CSF negative     04/16/2020 Biopsy    BM Bx with 40-50% cellularity, <1% blasts, MRD flow cytometry negative, BCR-ABL p210 transcripts 0.036%     05/28/2020 - 05/28/2020 Chemotherapy    OP AML - CNS THERAPY (INTRATHECAL CYTARABINE, INTRATHECAL METHOTREXATE, OR INTRATHECAL TRIPLE)  Select one of the following: cytarabine IT 100 mg with hydrocortisone 50 mg, cytarabine IT 40 mg with methotrexate 15 mg with hydrocortisone 50 mg, OR methotrexate IT 12 mg with hydrocortisone 50 mg     06/19/2020 -  Chemotherapy    IP/OP LEUKEMIA BLINATUMOMAB 7-DAY INFUSION (MINIMAL RESIDUAL DISEASE; WT >= 22 KG) (HOME INFUSION)      Cycles 1*-4: Blinatumomab 28 mcg/day Days 1-28 of 6-week cycle.  *Given in the inpatient setting on Days 1-3 on Cycle 1 and Days  1-2 on Cycle 2, while other treatment days are given in the outpatient setting.    Cycle 1 MRD Dosing     07/18/2020 Adverse Reaction    Hospitalization: fevers. Pneumonia     07/23/2020 Biopsy    Diagnosis  Bone marrow, right iliac, aspiration and biopsy  -   Normocellular bone marrow (50%) with trilineage hematopoiesis and 1% blasts by manual aspirate differential  -   Flow cytometry MRD analysis reveals no definitive immunophenotypic evidence of residual B lymphoblastic leukemia   - BCR-ABL p210 0.006%         08/10/2020 - Chemotherapy    Cycle 2 blinatumomab-ponatinib  Ponatinib 30mg     1 IT per cycle     09/21/2020 Adverse Reaction    Covid-19, symptomatic but not requiring hospitalization.     10/06/2020 -  Chemotherapy    Cycle 3 blinatumomab-ponatinib  Ponatinib 30mg        11/01/2020 Adverse Reaction    Hospitalization: MRSA Pneumonia requiring intubation    Blinatumomab stopped.  Ponatinib held until he stabilized.       12/14/2020 -  Chemotherapy    Cycle 4 blinatumomab 28 mcg/day days 1-28, ponatinib 30 mg per day IT triple therapy day 29     01/13/2021 -  Chemotherapy    Ponatinib 30mg  daily  - due to due low level BCR-ABL     03/06/2021 Adverse Reaction    Hospitalization - ICU on ventilator - influenza and MRSA/klebsiella PNA     03/10/2022 -  Chemotherapy    OP LEUKEMIA VINCRISTINE  vinCRIStine 2 mg IV on day 1     ALL (acute lymphoblastic leukemia) (CMS-HCC)   06/11/2020 -  Chemotherapy    IP/OP LEUKEMIA BLINATUMOMAB 7-DAY INFUSION (MINIMAL RESIDUAL DISEASE; WT >= 22 KG) (HOME INFUSION)  Cycles 1*-4: Blinatumomab 28 mcg/day Days 1-28 of 6-week cycle.  *Given in the inpatient setting on Days 1-3 on Cycle 1 and Days 1-2 on Cycle 2, while other treatment days are given in the outpatient setting.     09/21/2020 Initial Diagnosis    ALL (acute lymphoblastic leukemia) (CMS-HCC)     03/10/2022 -  Chemotherapy    OP LEUKEMIA VINCRISTINE  vinCRIStine 2 mg IV on day 1     Acute lymphoblastic leukemia in remission (CMS-HCC)   06/11/2020 -  Chemotherapy    IP/OP LEUKEMIA BLINATUMOMAB 7-DAY INFUSION (MINIMAL RESIDUAL DISEASE; WT >= 22 KG) (HOME INFUSION)  Cycles 1*-4: Blinatumomab 28 mcg/day Days 1-28 of 6-week cycle.  *Given in the inpatient setting on Days 1-3 on Cycle 1 and Days 1-2 on Cycle 2, while other treatment days are given in the outpatient setting.     11/09/2020 Initial Diagnosis    Acute lymphoblastic leukemia in remission (CMS-HCC)     03/10/2022 -  Chemotherapy    OP LEUKEMIA VINCRISTINE  vinCRIStine 2 mg IV on day 1         Weight and Vitals:  Wt Readings from Last 3 Encounters:   12/02/21 (!) 130.8 kg (288 lb 5.8 oz)   10/28/21 (!) 122.5 kg (270 lb)   10/11/21 (!) 124.7 kg (275 lb)     Temp Readings from Last 3 Encounters:   12/02/21 36.6 ??C (97.8 ??F) (Temporal)   10/28/21 36.3 ??C (97.3 ??F)   10/19/21 36.6 ??C (97.9 ??F) (Oral)     BP Readings from Last 3 Encounters:   12/02/21 132/88   10/19/21 133/67   10/11/21 144/84     Pulse Readings from  Last 3 Encounters:   12/02/21 85   10/19/21 97   08/31/21 82       Pertinent Labs:  Lab on 12/02/2021   Component Date Value Ref Range Status    Sodium 12/02/2021 142  135 - 145 mmol/L Final    Potassium 12/02/2021 4.1  3.4 - 4.8 mmol/L Final    Chloride 12/02/2021 108 (H)  98 - 107 mmol/L Final    CO2 12/02/2021 24.0  20.0 - 31.0 mmol/L Final    Anion Gap 12/02/2021 10  5 - 14 mmol/L Final    BUN 12/02/2021 12  9 - 23 mg/dL Final    Creatinine 16/01/9603 1.69 (H)  0.60 - 1.10 mg/dL Final    BUN/Creatinine Ratio 12/02/2021 7   Final    eGFR CKD-EPI (2021) Male 12/02/2021 51 (L)  >=60 mL/min/1.46m2 Final    eGFR calculated with CKD-EPI 2021 equation in accordance with [employer] and [employer] of Nephrology Task Force recommendations.    Glucose 12/02/2021 133  70 - 179 mg/dL Final    Calcium 54/12/8117 8.6 (L)  8.7 - 10.4 mg/dL Final    Albumin 14/78/2956 3.8  3.4 - 5.0 g/dL Final    Total Protein 12/02/2021 6.1  5.7 - 8.2 g/dL Final    Total Bilirubin 12/02/2021 0.3  0.3 - 1.2 mg/dL Final    AST 21/30/8657 19  <=34 U/L Final    ALT 12/02/2021 24  10 - 49 U/L Final    Alkaline Phosphatase 12/02/2021 90  46 - 116 U/L Final    WBC 12/02/2021 8.5  3.6 - 11.2 10*9/L Final    RBC 12/02/2021 5.48  4.26 - 5.60 10*12/L Final    HGB 12/02/2021 16.0  12.9 - 16.5 g/dL Final    HCT 84/69/6295 47.6  39.0 - 48.0 % Final    MCV 12/02/2021 86.9  77.6 - 95.7 fL Final    MCH 12/02/2021 29.1  25.9 - 32.4 pg Final    MCHC 12/02/2021 33.5  32.0 - 36.0 g/dL Final    RDW 28/41/3244 16.0 (H)  12.2 - 15.2 % Final    MPV 12/02/2021 8.6  6.8 - 10.7 fL Final    Platelet 12/02/2021 218  150 - 450 10*9/L Final    Neutrophils % 12/02/2021 63.8  % Final    Lymphocytes % 12/02/2021 24.0  % Final    Monocytes % 12/02/2021 5.6  % Final    Eosinophils % 12/02/2021 5.2  % Final    Basophils % 12/02/2021 1.4  % Final    Absolute Neutrophils 12/02/2021 5.4  1.8 - 7.8 10*9/L Final    Absolute Lymphocytes 12/02/2021 2.0  1.1 - 3.6 10*9/L Final    Absolute Monocytes 12/02/2021 0.5  0.3 - 0.8 10*9/L Final    Absolute Eosinophils 12/02/2021 0.4  0.0 - 0.5 10*9/L Final    Absolute Basophils 12/02/2021 0.1  0.0 - 0.1 10*9/L Final    Total IgG 12/02/2021 284 (L)  646 - 2,013 mg/dL Final       Allergies:   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  Onion Anaphylaxis    Vancomycin Analogues      Hearing loss with Lasix  Other reaction(s): Other (See Comments)  Hearing loss  lasix    Bismuth Subsalicylate Nausea And Vomiting    Privigen [Immun Glob G(Igg)-Pro-Iga 0-50] Other (See Comments)     03/10/2021 VIG infusion stopped as patient developed rigors, tachycardia and HTN during infusion despite pre-medication; 03/12/21 completed IVIG with premedications and slower rate of infusion    Furosemide      With Vancomycin caused hearing loss  Other reaction(s): Other (See Comments)  With Vancomycin caused hearing loss    Gammagard        Drug Interactions: none identified      Current Medications:  Current Outpatient Medications   Medication Sig Dispense Refill    amLODIPine (NORVASC) 10 MG tablet Take 1 tablet (10 mg total) by mouth daily. 30 tablet 0    aspirin 81 MG chewable tablet Chew 1 tablet (81 mg total) daily. 36 tablet 11    carvediloL (COREG) 12.5 MG tablet Take 1 tablet (12.5 mg total) by mouth Two (2) times a day. 180 tablet 1    clonazePAM (KLONOPIN) 0.5 MG tablet Take 1 tablet (0.5 mg total) by mouth two (2) times a day as needed for anxiety or sleep. 60 tablet 1    docusate sodium (COLACE) 100 MG capsule Take 1 capsule (100 mg total) by mouth daily.      doxycycline (VIBRA-TABS) 100 MG tablet Take 1 tablet (100 mg total) by mouth Two (2) times a day. 180 tablet 3    gabapentin (NEURONTIN) 300 MG capsule Take 2 capsules (600mg ) by mouth in the morning and 3 capsules (900mg ) at bedtime. May cause drowsiness. (Patient taking differently: 2 capsules (600 mg total) two (2) times a day. Take 1 capsules (600mg ) by mouth in the morning and 2 capsules (600mg ) at bedtime. May cause drowsiness.) 150 capsule 5    mirtazapine (REMERON) 15 MG tablet TAKE 1 TABLET BY MOUTH EVERY DAY AT NIGHT 90 tablet 4    nortriptyline (PAMELOR) 50 MG capsule Take 2 capsules (100 mg total) by mouth nightly.      omeprazole (PRILOSEC) 20 MG capsule Take 1 capsule (20 mg total) by mouth daily.      OXYCONTIN 15 mg 12 hr crush resistant ER/CR tablet Take 1 tablet (15 mg total) by mouth Two (2) times a day. (Patient taking differently: Take 1 tablet (15 mg total) by mouth nightly.) 56 tablet 0    PONATinib (ICLUSIG) 15 mg tablet Take 1 tablet (15 mg total) by mouth daily. Swallow tablets whole. Do not crush, break, cut or chew tablets. 30 tablet 5    sulfamethoxazole-trimethoprim (BACTRIM DS) 800-160 mg per tablet TAKE 1 TABLET BY MOUTH 2 TIMES A DAY ON SATURDAY, SUNDAY. FOR PROPHYLAXIS WHILE ON CHEMO. 48 tablet 3    umeclidinium-vilanteroL (ANORO ELLIPTA) 62.5-25 mcg/actuation inhaler Inhale 1 puff daily. 60 each 11    valACYclovir (VALTREX) 500 MG tablet Take 1 tablet (500 mg total) by mouth daily. 90 tablet 11    arm brace (WRIST BRACE) Misc 1 Piece by Miscellaneous route as needed. left ulnar wrist brace 1 each 0    HYDROmorphone (DILAUDID) 2 MG tablet Take 1 tablet (2 mg total) by mouth two (2) times a day as needed. (Patient not taking: Reported on 12/02/2021) 56 tablet 0    mucus clearing device Devi 1 each by Miscellaneous route Two (2) times a day. 1 each 0  No current facility-administered medications for this visit.       Adherence: no barriers identified        Assessment: Adam Keith is a 41 y.o. male with Ph+ B-ALL being treated currently with ponatinib 15 mg daily which was recently reduced for negative PCR.     Plan:   1) Ph+ B-ALL  -Continue ponatinib 15 mg PO daily  -Continue aspirin 81 mg daily while on ponatinib. Continue to control HTN (on amlodipine 10 mg daily)  -RTC 11/30, and discuss VCR/pred/ponatinib maintenance to start after. Plan to reduce pred to 100 mg D1-5 to minimize risk of infection complications  - Has received 6 total IT treatments. Consider additional treatments     2) ID ppx  - Continue doxycycline 100 mg BID per ICID for secondary MRSA PNA prevention  - Continue valtrex 500 mg daily  - Continue Bactrim DS BID on Sat/Sun    3) Supportive care  - Continue to follow with palliative/Dr. Genice Rouge for pain  - Continue inhalers, follow with ENT and audiology    F/u:  Future Appointments   Date Time Provider Department Center   12/09/2021 10:00 AM Everlena Cooper, MD [facility]   12/30/2021 12:40 PM SPCAUD AUDIOLOGIST [facility] [place] [facility]   12/30/2021  1:15 PM Corey Harold, MD [facility]   03/03/2022  8:00 AM ADULT ONC LAB [facility]   03/03/2022  9:00 AM Sean Marrian Salvage, AGNP [id number]       I spent 20 minutes with AdamKeith in direct patient care.      Manfred Arch, PharmD, BCOP, CPP  Pager: [phone]

## 2021-12-08 DIAGNOSIS — C9101 Acute lymphoblastic leukemia, in remission: Principal | ICD-10-CM

## 2021-12-08 DIAGNOSIS — R2243 Localized swelling, mass and lump, lower limb, bilateral: Principal | ICD-10-CM

## 2021-12-08 DIAGNOSIS — C9100 Acute lymphoblastic leukemia not having achieved remission: Principal | ICD-10-CM

## 2021-12-09 ENCOUNTER — Institutional Professional Consult (permissible substitution)
Admit: 2021-12-09 | Discharge: 2021-12-09 | Payer: MEDICAID | Attending: Student in an Organized Health Care Education/Training Program | Primary: Student in an Organized Health Care Education/Training Program

## 2021-12-09 ENCOUNTER — Other Ambulatory Visit: Admit: 2021-12-09 | Discharge: 2021-12-09 | Payer: MEDICAID

## 2021-12-09 ENCOUNTER — Ambulatory Visit: Admit: 2021-12-09 | Discharge: 2021-12-09 | Payer: MEDICAID

## 2021-12-09 DIAGNOSIS — C9100 Acute lymphoblastic leukemia not having achieved remission: Principal | ICD-10-CM

## 2021-12-09 DIAGNOSIS — I82432 Acute embolism and thrombosis of left popliteal vein: Principal | ICD-10-CM

## 2021-12-09 LAB — CBC W/ AUTO DIFF
BASOPHILS ABSOLUTE COUNT: 0.2 10*9/L — ABNORMAL HIGH (ref 0.0–0.1)
BASOPHILS RELATIVE PERCENT: 1.6 %
EOSINOPHILS ABSOLUTE COUNT: 0.2 10*9/L (ref 0.0–0.5)
EOSINOPHILS RELATIVE PERCENT: 2.3 %
HEMATOCRIT: 45.7 % (ref 39.0–48.0)
HEMOGLOBIN: 15.3 g/dL (ref 12.9–16.5)
LYMPHOCYTES ABSOLUTE COUNT: 2.1 10*9/L (ref 1.1–3.6)
LYMPHOCYTES RELATIVE PERCENT: 21 %
MEAN CORPUSCULAR HEMOGLOBIN CONC: 33.5 g/dL (ref 32.0–36.0)
MEAN CORPUSCULAR HEMOGLOBIN: 29.3 pg (ref 25.9–32.4)
MEAN CORPUSCULAR VOLUME: 87.4 fL (ref 77.6–95.7)
MEAN PLATELET VOLUME: 9.2 fL (ref 6.8–10.7)
MONOCYTES ABSOLUTE COUNT: 0.5 10*9/L (ref 0.3–0.8)
MONOCYTES RELATIVE PERCENT: 4.9 %
NEUTROPHILS ABSOLUTE COUNT: 6.9 10*9/L (ref 1.8–7.8)
NEUTROPHILS RELATIVE PERCENT: 70.2 %
PLATELET COUNT: 207 10*9/L (ref 150–450)
RED BLOOD CELL COUNT: 5.22 10*12/L (ref 4.26–5.60)
RED CELL DISTRIBUTION WIDTH: 15.6 % — ABNORMAL HIGH (ref 12.2–15.2)
WBC ADJUSTED: 9.8 10*9/L (ref 3.6–11.2)

## 2021-12-09 LAB — COMPREHENSIVE METABOLIC PANEL
ALBUMIN: 3.9 g/dL (ref 3.4–5.0)
ALKALINE PHOSPHATASE: 84 U/L (ref 46–116)
ALT (SGPT): 26 U/L (ref 10–49)
ANION GAP: 7 mmol/L (ref 5–14)
AST (SGOT): 19 U/L (ref <=34)
BILIRUBIN TOTAL: 0.4 mg/dL (ref 0.3–1.2)
BLOOD UREA NITROGEN: 13 mg/dL (ref 9–23)
BUN / CREAT RATIO: 7
CALCIUM: 8.5 mg/dL — ABNORMAL LOW (ref 8.7–10.4)
CHLORIDE: 109 mmol/L — ABNORMAL HIGH (ref 98–107)
CO2: 26 mmol/L (ref 20.0–31.0)
CREATININE: 1.86 mg/dL — ABNORMAL HIGH
EGFR CKD-EPI (2021) MALE: 46 mL/min/{1.73_m2} — ABNORMAL LOW (ref >=60)
GLUCOSE RANDOM: 127 mg/dL (ref 70–179)
POTASSIUM: 4.1 mmol/L (ref 3.4–4.8)
PROTEIN TOTAL: 6 g/dL (ref 5.7–8.2)
SODIUM: 142 mmol/L (ref 135–145)

## 2021-12-09 MED ORDER — ELIQUIS DVT-PE TREATMENT 30-DAY STARTER 5 MG (74 TABLETS) IN DOSE PACK
ORAL_TABLET | 1 refills | 0 days | Status: CP
Start: 2021-12-09 — End: ?

## 2021-12-09 NOTE — Unmapped (Unsigned)
Blood specimen's obtained via peripheral blood draw and sent to lab for processing. See phlebotomy flowsheet for specific. Germaine Ripp, RN

## 2021-12-09 NOTE — Unmapped (Unsigned)
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 He was recently admitted to [facility] with with complicated PNA requiring intubation.  Has had episodic fluctuations in mood but currently doing fairly well.  Has decided not to pursue bone marrow transplant at this time.    Assessment/Plan:     # Depressed Mood: Continues to improve as he has been more active.  Still intermittent periods of depression but feels like he is doing better. His wife is his primary support, and he also follows with psych and a therapist.    -Medications per Adam Keith      #Pain: Right lower extremity pain resolved.  Now predominantly has chronic back pain.  Very motivated to get off of opioids given his goal is to return to work and he worries that this is not allowed for his job.  No changes today.  -Continue gabapentin to 600mg  qAM and 900mg  qbedtime  -No longer using Dilaudid dilaudid 2mg  PO BID PRN       - PDMP reviewed  - Continue oxycontin 15mg  PO BID, new prescription sent today       - PDMP reviewed,   - Referral to cancer rehab, so, but was not interested in following up with rehab or physical therapy    # LUE pain: Improved.   - Pain management as above    #Chronic pain: At baseline, see above  - Pain management as above    #Advance care planning: Continues to prioritize quality of life.  After discussion with infectious disease as well as oncology has made the decision not to pursue another bone marrow transplant at this time.    #Controlled substances risk management:  ??? Patient does not have a signed pain medication agreement with our team.  ??? [place] database was reviewed today and it was??appropriate.  ??? Urine drug screen was not??performed at this visit. Findings: not applicable.  ??? Patient has received information about safe storage and administration of medications.  ??? Patient has not??received a prescription for narcan; Adam Sites, MD    HPI: 41 year old man with a diagnosis of B-ALL, diagnosed in October 2021, now in complete remission.??Recently admitted to the [facility] ICU 03/06/21-03/20/21 for AHRF d/t MRSA/klebsiella PNA, requiring intubation. This is following admissions in August, May, April, March, February, and January 2022.    Interval History:   Doing a telephone visit today. Went back to work last week. Feels like he has noticed a lot of problems since returning. Tried to stop daytime Oxycontin and felt like that was terrible and had significant withdrawal symptoms. Continues to be worried about having opioids in his system bc of workmans comp not covering him.     Plan is to try and drop some weight to help with his pain. Was on steroids for the past month to treat his hearing loss. Feltlike this helped some but still has some loss. Following up with audiology at the end of the month. Legs have been swollen. Coming back in today to get [place]. Can't take lasix bc of his kidneys. Got chemo results yesterday and this was worrisome to him. Is discussing with oncology about wanting to increase his chemo back.     Has noticed how deconditioned he has gotten over the last 2 years. Has decreased his Gabapentin to 300mg  in the am and 600mg  in the evening. Does have some sciatic pain. Not taking Klonopin. Has noticed  that he gets extremely fatigued around 1p each day.        Palliative Performance Scale: 90% - Ambulation: Full / Normal Activity, some evidence of disease / Self-Care:Full / Intake: Normal / Level of Conscious: Full    Coping/Support Issues: Having a lot of difficulty coping with his current setback. Well supported by his wife. Also has a psychiatrist and therapist.  ??  Goals of Care: Treatment and cancer-directed therapy  ??  Social History:  Name of primary support: Wife Adam Keith, 2 daughters.  Occupation: Was in the [employer]	[place]  Hobbies: Woodworking  Current residence / distance from [facility]: [place]    Has a longer able to hear traffic. 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     Has decided not to do the BMT. Met with ID and feels like the risk isn't worth the potential benefits.     Mood has been up and down. Remains frustrated and irritable at times, but overall feels like his is pretty stable. Has gotten to see his family a lot the last few days. His daughters are starting college in a few weeks. These are his twins (52 year olds) from his first marriage. Adam Keith's oldest son died 12 years ago. He just adopted Adam Keith's son and daughter 2 months ago. They are 20 and 19. Twins live with their mom, son lives with gf, daughter lives with BF and grandparents.     Has been able to get out in the shop, mowed the lawn last night (has to avoid the sun).     Pain has been tough, back hurting a lot. [facility]. Still sleeping well. Not having to take Dilaudid.       Palliative Performance Scale: 90% - Ambulation: Full / Normal Activity, some evidence of disease / Self-Care:Full / Intake: Normal / Level of Conscious: Full    Coping/Support Issues: Having a lot of difficulty coping with his current setback. Well supported by his wife. Also has a psychiatrist and therapist.  ??  Goals of Care: Treatment and cancer-directed therapy  ??  Social History:  Name of primary support: Wife Adam Keith, 2 daughters.  Occupation: Was in the [employer]	[place]  Hobbies: Woodworking  Current residence / distance from [facility]: [place]    Has a 98 year old son.   ??  Advance Care Planning: Did not discuss this visit.    Objective     Allergies:   Allergies   Allergen Reactions   ??? Bupropion Hcl Other (See Comments)     Per patient out of touch with reality, suicidal, homicidal   ??? Cefepime Rash     DRESS   ??? Ceftaroline Fosamil Rash and Other (See Comments)     Rash X 2 02/2020, suspected DRESS 06/2020   ??? Dapsone Other (See Comments) and Anaphylaxis     Possible agranulocytosis that the status of his neg hx is unknown.      REVIEW OF SYSTEMS:  A comprehensive review of 10 systems was negative except for pertinent positives noted in HPI.    Lab Results   Component Value Date    CREATININE 1.69 (H) 12/02/2021     Lab Results   Component Value Date    ALKPHOS 90 12/02/2021    BILITOT 0.3 12/02/2021    BILIDIR 0.30 03/20/2021    PROT 6.1 12/02/2021    ALBUMIN 3.8 12/02/2021  ALT 24 12/02/2021    AST 19 12/02/2021     Physical exam:  General: Overall well-appearing man in no acute distress  Pulmonary: No increased work of breathing  Neurological: Normal gait, no acute deficits  Psychiatric: Mood pretty good today affect appropriate and full range         The patient reports they are currently: at home. I spent 28 minutes on the phone with the patient on the date of service. I spent an additional 12 minutes on pre- and post-visit activities on the date of service.     The patient was physically located in [place] or a state in which I am permitted to provide care. The patient and/or parent/guardian understood that s/he may incur co-pays and cost sharing, and agreed to the telemedicine visit. The visit was reasonable and appropriate under the circumstances given the patient's presentation at the time.    The patient and/or parent/guardian has been advised of the potential risks and limitations of this mode of treatment (including, but not limited to, the absence of in-person examination) and has agreed to be treated using telemedicine. The patient's/patient's family's questions regarding telemedicine have been answered.     If the visit was completed in an ambulatory setting, the patient and/or parent/guardian has also been advised to contact their provider???s office for worsening conditions, and seek emergency medical treatment and/or call 911 if the patient deems either necessary.    Kathlynn Grate, MD  [facility] Outpatient Oncology Palliative Care Physical exam:  General: Overall well-appearing man in no acute distress  Pulmonary: No increased work of breathing  Neurological: Normal gait, no acute deficits  Psychiatric: Mood pretty good today affect appropriate and full range     {    Coding tips - Do not edit this text, it will delete upon signing of note!    ?? Telephone visits [phone] for Physicians and APP??s and [phone] for Non- Physician Clinicians)- Only use minutes on the phone to determine level of service.    ?? Video visits [phone]) - Use both minutes on video and pre/post minutes to determine level of service.       :[id number]}    The patient reports they are currently: at home. I spent 22 minutes on the real-time audio and video with the patient on the date of service. I spent an additional 12 minutes on pre- and post-visit activities on the date of service.     The patient was physically located in [place] or a state in which I am permitted to provide care. The patient and/or parent/guardian understood that s/he may incur co-pays and cost sharing, and agreed to the telemedicine visit. The visit was reasonable and appropriate under the circumstances given the patient's presentation at the time.    The patient and/or parent/guardian has been advised of the potential risks and limitations of this mode of treatment (including, but not limited to, the absence of in-person examination) and has agreed to be treated using telemedicine. The patient's/patient's family's questions regarding telemedicine have been answered.     If the visit was completed in an ambulatory setting, the patient and/or parent/guardian has also been advised to contact their provider???s office for worsening conditions, and seek emergency medical treatment and/or call 911 if the patient deems either necessary.    Kathlynn Grate, MD  [facility] Outpatient Oncology Palliative Care

## 2021-12-10 DIAGNOSIS — C9500 Acute leukemia of unspecified cell type not having achieved remission: Principal | ICD-10-CM

## 2021-12-10 DIAGNOSIS — C9101 Acute lymphoblastic leukemia, in remission: Principal | ICD-10-CM

## 2021-12-10 MED ORDER — PONATINIB 30 MG TABLET
ORAL_TABLET | Freq: Every day | ORAL | 5 refills | 30 days | Status: CP
Start: 2021-12-10 — End: ?
  Filled 2021-12-21: qty 30, 30d supply, fill #0

## 2021-12-10 MED ORDER — VALACYCLOVIR 500 MG TABLET
ORAL_TABLET | Freq: Every day | ORAL | 11 refills | 90 days | Status: CP
Start: 2021-12-10 — End: ?

## 2021-12-10 NOTE — Unmapped (Addendum)
[facility] Refill Coordination Note    Specialty Medication(s) to be Shipped:   Hematology/Oncology: Iclusig 30mg     Other medication(s) to be shipped: No additional medications requested for fill at this time     Adam Keith, DOB: 30-Dec-1980  Phone: [phone] (home)       All above HIPAA information was verified with patient.     Was a Nurse, learning disability used for this call? No    Completed refill call assessment today to schedule patient's medication shipment from the [facility] [phone]).  All relevant notes have been reviewed.     Specialty medication(s) and dose(s) confirmed: Patient reports changes to the regimen as follows: Dose has increased from Iclusig 15mg  to 30mg    Changes to medications: Adam Keith reports no changes at this time.  Changes to insurance: No  New side effects reported not previously addressed with a pharmacist or physician: None reported  Questions for the pharmacist: No    Confirmed patient received a Conservation officer, historic buildings and a Surveyor, mining with first shipment. The patient will receive a drug information handout for each medication shipped and additional FDA Medication Guides as required.       DISEASE/MEDICATION-SPECIFIC INFORMATION        N/A    SPECIALTY MEDICATION ADHERENCE     Medication Adherence    Patient reported X missed doses in the last month: 0  Specialty Medication: Iclusig dose increased to 30 mg once daily  Patient is on additional specialty medications: No  Informant: patient                          Were doses missed due to medication being on hold? No    Iclusig 30 mg: 0 days of medicine on hand   Patient was previously on Iclusig 15mg     REFERRAL TO PHARMACIST     Referral to the pharmacist: Not needed      [facility]     Shipping address confirmed in Epic.     Delivery Scheduled: Yes, Expected medication delivery date: 12/15/21.     Medication will be delivered via UPS to the prescription address in Epic WAM.    Jasper Loser   [facility] Specialty Technician

## 2021-12-10 NOTE — Unmapped (Addendum)
[facility] Pharmacist has reviewed a new prescription for Iclusig that indicates a dose increase.  Patient was counseled on this dosage change by clinic provider/CPP- see epic encounter from 12/07/21.  Next refill call date adjusted if necessary. He is currently using the 15 mg tablets until delivery is scheduled for 30 mg strength.    Adam Keith, PharmD  [facility]    Clinical Assessment Needed For: Dose Change  Medication: Iclusig  Last Fill Date/Day Supply: 8-28 / 30  Copay $4  Was previous dose already scheduled to fill: No    Notes to Pharmacist:

## 2021-12-14 NOTE — Unmapped (Signed)
Adam Keith 's Iclusig shipment will be delayed as a result of insufficient inventory of the drug.     I have reached out to the patient  and communicated the delay. We will reschedule the medication for the delivery date that the patient agreed upon.  We have confirmed the delivery date as 12/16/21, via ups.

## 2021-12-17 NOTE — Unmapped (Signed)
Informed Consent, HIPAA and Eligibility  Study: [facility] [employer] Health Coaching Program     I reviewed patient's chart to assess eligibility and then followed up with a patient call to discuss consent for [facility] [employer] Coaching Program. The essential aspects of the study were discussed with the patient, including: study protocol, length of participation, and participation requirements such as survey completion and wearing and syncing their health tracker, risks & benefits, confidentiality and alternatives to study participation. Patient was offered time to ask questions, in the absence of coercion or undue influence. All questions were answered to the patient's satisfaction. Patient verbalized understanding of information presented.   Patient stated they would like to receive a consent form. An e-consent and HIPAA form will be sent via [employer]. The patient will be emailed a copy of both forms after completion. Patient has been screened and determined to be eligible for [employer].    The patient has signed the study informed consent and HIPAA Authorization Form as follows:   X    Electronic agreement via [employer] form  A copy of the informed consent and HIPAA Authorization Form was given to the patient.  The informed consent and HIPAA Authorization Form will be placed in the regulatory files in the Office of Clinical & Translational Research. Every effort to maintain confidentiality will be employed.   Date: 12/17/2021  X     HIPAA Form Signature:  Same as above       Eligibility Criteria:  Inclusion Criteria:  a. Have an appointment at [facility], including if they are an inpatient at [facility]  - Pt seen at [facility] by Dr. Kathlynn Grate on 12/09/2021  b. Being actively seen by a clinical service providing care to patients with cancer  -Pt actively seen at the [facility]'s Leukemia clnic by Dr. Mariel Aloe. Last seen on 12/02/2021  c. Patient is > 34 years of age.  -Pt DOB Aug 23, 1980  d. Willing and able to participate in a 57-month intervention  -Pt indicated willing and able  e. Willing and able to provide patient-reported survey responses electronically or by telephone  - Pt indicated willing and able  f. Ability to use a wearable sensor and view wearable sensor data from home  -Pt indicated able  g. Working Agricultural engineer address and ability to check and respond to email  -Pt provided me with working email address  h. Participation in a research study is allowable by participants insurance provider  - Pt indicated no such concern  i. English or Spanish Speaking  - Pt is English speaking  We will require a medical clearance from the patient's cardiologist if they have: had a recent myocardial infarction or echocardiography changes (< 6 months prior to screening), complete heart block, acute congestive heart failure, unstable angina, or uncontrolled severe hypertension (BP ?180/110 mm Hg)  -N/A per medical chart review     Exclusion Criteria:  a. A subject will not be eligible for inclusion in The Program if, in the investigator's (or treating clinician's) opinion, the patient has any concurrent medical or psychiatric condition that may preclude participation (i.e. moderate-to-severe dementia and/or severe, uncontrolled schizophrenia, or other condition that would render them unable to provide informed consent or complete questionnaires).  - No such concern. Pt fit for participation  a. Patients who cannot speak or read [place] or Spanish will not be included in the study as our measures, website, and intervention have not been translated  into other languages.  -Pt is English Speaking    Research plan: Participants on study may work with a Control and instrumentation engineer for 6 months. Participants who work with a Control and instrumentation engineer will set weekly SMART Goals, take weekly PRO surveys, and have phone call or video call coaching sessions weekly, and have access to our study specific website. Participants will complete baseline, 3 month and 6 month physical assessments and surveys. Participants will receive a Fitbit health tracker.

## 2021-12-20 DIAGNOSIS — C9101 Acute lymphoblastic leukemia, in remission: Principal | ICD-10-CM

## 2021-12-22 NOTE — Unmapped (Signed)
Spoke with [name] in regards to him continuing to experience BLE swelling with pain associated. 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 [name] has been weighing himself daily and says for the past two days he has weighed 280 lbs. He describes his appearance as looking puffy but has a majority of his swelling in bilateral legs. He endorses no increase in activity as of late. He would like to consider a diuretic. After consulting with the team, we recommend consulting with PCP in regards to continuous swelling. Relayed this recommendation to Mr. Hoffer. He will reach out to PCP and keep [place] posted.     Elicia Lamp, RN

## 2021-12-28 ENCOUNTER — Ambulatory Visit: Admit: 2021-12-28 | Discharge: 2021-12-29 | Payer: MEDICAID | Attending: Adult Health | Primary: Adult Health

## 2021-12-28 ENCOUNTER — Ambulatory Visit: Admit: 2021-12-28 | Discharge: 2021-12-29 | Payer: MEDICAID

## 2021-12-28 ENCOUNTER — Other Ambulatory Visit: Admit: 2021-12-28 | Discharge: 2021-12-29 | Payer: MEDICAID

## 2021-12-28 DIAGNOSIS — C9100 Acute lymphoblastic leukemia not having achieved remission: Principal | ICD-10-CM

## 2021-12-28 DIAGNOSIS — C9101 Acute lymphoblastic leukemia, in remission: Principal | ICD-10-CM

## 2021-12-28 DIAGNOSIS — E8770 Fluid overload, unspecified: Principal | ICD-10-CM

## 2021-12-28 LAB — CBC W/ AUTO DIFF
BASOPHILS ABSOLUTE COUNT: 0.1 10*9/L (ref 0.0–0.1)
BASOPHILS RELATIVE PERCENT: 0.6 %
EOSINOPHILS ABSOLUTE COUNT: 0.5 10*9/L (ref 0.0–0.5)
EOSINOPHILS RELATIVE PERCENT: 5.3 %
HEMATOCRIT: 47.2 % (ref 39.0–48.0)
HEMOGLOBIN: 16.5 g/dL (ref 12.9–16.5)
LYMPHOCYTES ABSOLUTE COUNT: 1.9 10*9/L (ref 1.1–3.6)
LYMPHOCYTES RELATIVE PERCENT: 19.6 %
MEAN CORPUSCULAR HEMOGLOBIN CONC: 35 g/dL (ref 32.0–36.0)
MEAN CORPUSCULAR HEMOGLOBIN: 29.8 pg (ref 25.9–32.4)
MEAN CORPUSCULAR VOLUME: 85.2 fL (ref 77.6–95.7)
MEAN PLATELET VOLUME: 8.9 fL (ref 6.8–10.7)
MONOCYTES ABSOLUTE COUNT: 0.4 10*9/L (ref 0.3–0.8)
MONOCYTES RELATIVE PERCENT: 4.7 %
NEUTROPHILS ABSOLUTE COUNT: 6.6 10*9/L (ref 1.8–7.8)
NEUTROPHILS RELATIVE PERCENT: 69.8 %
PLATELET COUNT: 178 10*9/L (ref 150–450)
RED BLOOD CELL COUNT: 5.55 10*12/L (ref 4.26–5.60)
RED CELL DISTRIBUTION WIDTH: 15.9 % — ABNORMAL HIGH (ref 12.2–15.2)
WBC ADJUSTED: 9.5 10*9/L (ref 3.6–11.2)

## 2021-12-28 LAB — COMPREHENSIVE METABOLIC PANEL
ALBUMIN: 4.3 g/dL (ref 3.4–5.0)
ALKALINE PHOSPHATASE: 86 U/L (ref 46–116)
ALT (SGPT): 35 U/L (ref 10–49)
ANION GAP: 8 mmol/L (ref 5–14)
AST (SGOT): 20 U/L (ref <=34)
BILIRUBIN TOTAL: 0.5 mg/dL (ref 0.3–1.2)
BLOOD UREA NITROGEN: 11 mg/dL (ref 9–23)
BUN / CREAT RATIO: 6
CALCIUM: 9.3 mg/dL (ref 8.7–10.4)
CHLORIDE: 108 mmol/L — ABNORMAL HIGH (ref 98–107)
CO2: 26 mmol/L (ref 20.0–31.0)
CREATININE: 1.76 mg/dL — ABNORMAL HIGH
EGFR CKD-EPI (2021) MALE: 49 mL/min/{1.73_m2} — ABNORMAL LOW (ref >=60)
GLUCOSE RANDOM: 112 mg/dL (ref 70–179)
POTASSIUM: 4.5 mmol/L (ref 3.4–4.8)
PROTEIN TOTAL: 6.6 g/dL (ref 5.7–8.2)
SODIUM: 142 mmol/L (ref 135–145)

## 2021-12-28 MED ORDER — SPIRONOLACTONE 25 MG TABLET
ORAL_TABLET | Freq: Every day | ORAL | 3 refills | 90 days | Status: CP
Start: 2021-12-28 — End: 2022-12-28

## 2021-12-28 NOTE — Unmapped (Signed)
It was great to see you today!    I'm sorry you are having so much trouble with swelling.     We will start spironolactone 25 mg once daily.     We will need to check labs next week at a local [facility].     We will place an order for an ECHO (heart ultrasound) to evaluate your heart function.     We will know more about the swelling after we get the ECHO back.     Please elevate your legs as much as possible and wear compression socks to help with the swelling.

## 2021-12-29 NOTE — Unmapped (Addendum)
Leukemia Expanded Access    Urgent Visit Note    Patient Name: Adam Keith  Patient Age: 41 y.o.  Encounter Date: 12/28/2021    Referring Physician:     Dierdre Harness, MD  [address]  [place],  [place] [address]    Primary Care Provider:  Dierdre Harness, MD    Consulting Physician:    Reason for visit: lower extremity swelling    Assessment:  Adam Keith is a 41 y.o. 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 The course was complicated by septic shock, candida krusei fungemia, MRSA bacteremia with septic emboli c/b acute renal failure and respiratory distress requiring dialysis and intubation.  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  He subsequently had difficulty tolerating single agent dasatinib (as a bridge to planned ponatinib-blinatumomab--had fluid retention and pleural effusion).  He then completed  4 cycles of ponatinib-blinatumomab, initiated for treatment of MRD in the setting of poor tolerance of standard therapy. Patient is currently on ponatinib 30 mg monotherapy (since 01/2021) with most recent BCR-ABL level of 0.002% in the blood (12/02/21). He has decided against transplant. He presents to clinic today for evaluation of bilateral lower extremity edema.     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 He reports low energy but able to accomplish what he needs to do. He has cough and DOE, wife endorses worse than baseline, which he reluctantly agrees with. He notes significant weight gain and lower extremity swelling for which he would like to trial a diuretic. He has several allergies and lasix is not a good option for him. Labs today indicate stable CBC and stable CKD with sCr 1.76. He will start low dose spironolactone 25mg  daily. He should repeat lab work next week locally to evaluate renal function. Will also obtain ECHO to evaluate for cardiac source of swelling. He is advised to elevate extremities and wear compression stockings which he is not enthusiastic about. Will obtain CXR today due to DOE. His clinic follow up will be pending results from the above testing.       Plans and Recommendations:  - cont ponatinib 30mg  daily  - start spiroonolactone 25mg  daily  - CXR today   - will get ECHO, schedule pending  - obtain local labs next week  - follow up in clinic pending testing    ________________________________________________________________________________________________________________    I personally spent 45 minutes face-to-face and non-face-to-face in the care of this patient, which includes all pre, intra, and post visit time on the date of service.      Lavonda Jumbo, AGPCNP-BC  Leukemia Expanded Access Provider  [facility]    Interval history:  Adam Keith reports feeling generally ok currently. He notes that he has a cough, this is chronic but wife states recently worsened which he agrees with. He also has some DOE, using appropriate breaks when exerting himself. He is back at work and not back to his pre-treatment baseline, but is able to fulfill his duties. He states he has been experiencing lower extremity edema since being diagnosed with a DVT and having an admission for covid in July 2023. He reports significant weight gain during this time without change in appetite or intake. He is not having nausea, vomiting, constipation or diarrhea. He denies fevers or chills.    Review of  Systems:  Review of Systems   Constitutional:  Positive for fatigue and unexpected weight change. Negative for appetite change, chills and fever.   HENT:  Negative.     Respiratory:  Positive for cough and shortness of breath.    Cardiovascular:  Positive for leg swelling. Negative for chest pain.   Gastrointestinal:  Negative for abdominal pain, constipation, diarrhea, nausea and vomiting. Genitourinary: Negative.     Musculoskeletal:  Negative for gait problem.   Skin: Negative.    Neurological:  Negative for dizziness, gait problem and light-headedness.   Hematological: Negative.    Psychiatric/Behavioral: Negative.       Oncology History:    Oncology History Overview Note   Referring/Local Oncologist: None    Diagnosis:Ph+ ALL    Genetics:    Karyotype/FISH:Abnormal Karyotype: 46,XY,t(9;22)(q34;q11.2)[1]/45,XY,der(7;9)(q10;q10)t(9;22)(q34;q11.2),der(22)t(9;22)[11]/46,sdl,+der(22)t(9;22)[5]/46,XY[3]     Abnormal FISH: A BCR/ABL1 interphase FISH assay shows an abnormal signal pattern in 97% of the 100 cells scored. 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  The findings support a diagnosis of leukemia and have implications for targeted therapy and for monitoring residual disease.      Molecular Genetics:  BCR-ABL1 p210 transcripts were detected at a level of 46.479 IS% ratio in bone marrow.  BCR-ABL1 p190 transcripts were detected at a level of 4 in 100,000 cells in bone marrow.    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)     01/24/2020 - 04/02/2020 Chemotherapy    IP/OP LEUKEMIA GRAAPH-2005 + RITUXIMAB < 60 YO  rituximab hypercvad (odd and even course)     02/24/2020 Remission    CR with PCR MRD+; marrow showing 70% blasts with <1% blasts, negative flow MRD, BCR-ABL p210 PCR 0.197%; CNS negative for blasts     03/09/2020 Progression    CSF - rare blast ID'ed - IT chemo given    03/13/20 - IT chemo, CSF negative     04/16/2020 Biopsy    BM Bx with 40-50% cellularity, <1% blasts, MRD flow cytometry negative, BCR-ABL p210 transcripts 0.036%     05/28/2020 - 05/28/2020 Chemotherapy    OP AML - CNS THERAPY (INTRATHECAL CYTARABINE, INTRATHECAL METHOTREXATE, OR INTRATHECAL TRIPLE)  Select one of the following: cytarabine IT 100 mg with hydrocortisone 50 mg, cytarabine IT 40 mg with methotrexate 15 mg with hydrocortisone 50 mg, OR methotrexate IT 12 mg with hydrocortisone 50 mg     06/19/2020 -  Chemotherapy    IP/OP LEUKEMIA BLINATUMOMAB 7-DAY INFUSION (MINIMAL RESIDUAL DISEASE; WT >= 22 KG) (HOME INFUSION)      Cycles 1*-4: Blinatumomab 28 mcg/day Days 1-28 of 6-week cycle.  *Given in the inpatient setting on Days 1-3 on Cycle 1 and Days 1-2 on Cycle 2, while other treatment days are given in the outpatient setting.    Cycle 1 MRD Dosing     07/18/2020 Adverse Reaction    Hospitalization: fevers. Pneumonia     07/23/2020 Biopsy    Diagnosis  Bone marrow, right iliac, aspiration and biopsy  -   Normocellular bone marrow (50%) with trilineage hematopoiesis and 1% blasts by manual aspirate differential  -   Flow cytometry MRD analysis reveals no definitive immunophenotypic evidence  of residual B lymphoblastic leukemia   - BCR-ABL p210 0.006%         08/10/2020 -  Chemotherapy    Cycle 2 blinatumomab-ponatinib  Ponatinib 30mg     1 IT per cycle     09/21/2020 Adverse Reaction    Covid-19, symptomatic but not requiring hospitalization.     10/06/2020 -  Chemotherapy    Cycle 3 blinatumomab-ponatinib  Ponatinib 30mg        11/01/2020 Adverse Reaction    Hospitalization: MRSA Pneumonia requiring intubation    Blinatumomab stopped.  Ponatinib held until he stabilized.       12/14/2020 -  Chemotherapy    Cycle 4 blinatumomab 28 mcg/day days 1-28, ponatinib 30 mg per day IT triple therapy day 29     01/13/2021 -  Chemotherapy    Ponatinib 30mg  daily  - due to due low level BCR-ABL     03/06/2021 Adverse Reaction    Hospitalization - ICU on ventilator - influenza and MRSA/klebsiella PNA     03/10/2022 -  Chemotherapy    OP LEUKEMIA VINCRISTINE  vinCRIStine 2 mg IV on day 1 ALL (acute lymphoblastic leukemia) (CMS-HCC)   06/11/2020 -  Chemotherapy    IP/OP LEUKEMIA BLINATUMOMAB 7-DAY INFUSION (MINIMAL RESIDUAL DISEASE; WT >= 22 KG) (HOME INFUSION)  Cycles 1*-4: Blinatumomab 28 mcg/day Days 1-28 of 6-week cycle.  *Given in the inpatient setting on Days 1-3 on Cycle 1 and Days 1-2 on Cycle 2, while other treatment days are given in the outpatient setting.     09/21/2020 Initial Diagnosis    ALL (acute lymphoblastic leukemia) (CMS-HCC)     03/10/2022 -  Chemotherapy    OP LEUKEMIA VINCRISTINE  vinCRIStine 2 mg IV on day 1     Acute lymphoblastic leukemia in remission (CMS-HCC)   06/11/2020 -  Chemotherapy    IP/OP LEUKEMIA BLINATUMOMAB 7-DAY INFUSION (MINIMAL RESIDUAL DISEASE; WT >= 22 KG) (HOME INFUSION)  Cycles 1*-4: Blinatumomab 28 mcg/day Days 1-28 of 6-week cycle.  *Given in the inpatient setting on Days 1-3 on Cycle 1 and Days 1-2 on Cycle 2, while other treatment days are given in the outpatient setting.     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      Past Surgical History:   Procedure Laterality Date    BONE MARROW BIOPSY & ASPIRATION  01/21/2020         CHG [place], CHEST,REAL TIME  11/20/2020    Procedure: ULTRASOUND, CHEST, REAL TIME WITH IMAGE DOCUMENTATION;  Surgeon: Jerelyn Charles, MD;  Location: BRONCH PROCEDURE LAB [facility];  Service: Pulmonary    IR INSERT PORT AGE GREATER THAN 5 YRS  03/10/2020    IR INSERT PORT AGE GREATER THAN 5 YRS 03/10/2020 [name], MD IMG VIR [facility]    PR BRONCHOSCOPY,DIAGNOSTIC W LAVAGE Bilateral 07/20/2020    Procedure: BRONCHOSCOPY, RIGID OR FLEXIBLE, INCLUDE FLUOROSCOPIC GUIDANCE WHEN PERFORMED; W/BRONCHIAL ALVEOLAR LAVAGE WITH MODERATE SEDATION;  Surgeon: Dellis Filbert, MD;  Location: [facility] PROCEDURE LAB [facility];  Service: Pulmonary        Family History   Problem Relation Age of Onset    Cancer Mother  No Known Problems Father     No Known Problems Sister     No Known Problems Brother     No Known Problems Maternal Aunt     No Known Problems Maternal Uncle     No Known Problems Paternal Aunt     No Known Problems Paternal Uncle     No Known Problems Maternal Grandmother     Cancer Maternal Grandfather     No Known Problems Paternal Grandmother     No Known Problems Paternal Grandfather     Melanoma Neg Hx     Basal cell carcinoma Neg Hx     Squamous cell carcinoma Neg Hx       Family Status   Relation Name Status    Mother  (Not Specified)    Father  (Not Specified)    Sister  (Not Specified)    Brother  (Not Specified)    Mat Aunt  (Not Specified)    Mat Uncle  (Not Specified)    Oceanographer  (Not Specified)    Oneal Grout  (Not Specified)    MGM  (Not Specified)    MGF  (Not Specified)    PGM  (Not Specified)    PGF  (Not Specified)    Neg Hx  (Not Specified)     Social History     Occupational History    Not on file   Tobacco Use    Smoking status: Every Day     Packs/day: 2     Types: Cigarettes     Passive exposure: Current    Smokeless tobacco: Former     Types: Catering manager Use: Never used   Substance and Sexual Activity    Alcohol use: Not Currently    Drug use: Not on file    Sexual activity: Not on file       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    Onion Anaphylaxis    Vancomycin Analogues      Hearing loss with Lasix  Other reaction(s): Other (See Comments)  Hearing loss  lasix    Bismuth Subsalicylate Nausea And Vomiting    Privigen [Immun Glob G(Igg)-Pro-Iga 0-50] Other (See Comments) 03/10/2021 VIG infusion stopped as patient developed rigors, tachycardia and HTN during infusion despite pre-medication; 03/12/21 completed IVIG with premedications and slower rate of infusion    Furosemide      With Vancomycin caused hearing loss  Other reaction(s): Other (See Comments)  With Vancomycin caused hearing loss    Gammagard          Current Outpatient Medications   Medication Sig Dispense Refill    amLODIPine (NORVASC) 10 MG tablet Take 1 tablet (10 mg total) by mouth daily. 30 tablet 0    apixaban (ELIQUIS DVT-PE TREAT 30D START) 5 mg (74 tabs) DsPk Take 2 tablets (10 mg total) by mouth twice daily for 7 days, then take 1 tablet (5 mg) by mouth twice daily 70 tablet 1    arm brace (WRIST BRACE) Misc 1 Piece by Miscellaneous route as needed.  left ulnar wrist brace 1 each 0    aspirin 81 MG chewable tablet Chew 1 tablet (81 mg total) daily. 36 tablet 11    carvediloL (COREG) 12.5 MG tablet Take 1 tablet (12.5 mg total) by mouth Two (2) times a day. 180 tablet 1    docusate sodium (COLACE) 100 MG capsule Take 1 capsule (100 mg total) by mouth daily.      doxycycline (VIBRA-TABS) 100 MG tablet Take 1 tablet (100 mg total) by mouth Two (2) times a day. 180 tablet 3    gabapentin (NEURONTIN) 300 MG capsule Take 2 capsules (600mg ) by mouth in the morning and 3 capsules (900mg ) at bedtime. May cause drowsiness. (Patient taking differently: 2 capsules (600 mg total) two (2) times a day. Take 1 capsules (600mg ) by mouth in the morning and 2 capsules (600mg ) at bedtime. May cause drowsiness.) 150 capsule 5    mirtazapine (REMERON) 15 MG tablet TAKE 1 TABLET BY MOUTH EVERY DAY AT NIGHT 90 tablet 4    mucus clearing device Devi 1 each by Miscellaneous route Two (2) times a day. 1 each 0    nortriptyline (PAMELOR) 50 MG capsule Take 2 capsules (100 mg total) by mouth nightly.      omeprazole (PRILOSEC) 20 MG capsule Take 1 capsule (20 mg total) by mouth daily.      OXYCONTIN 15 mg 12 hr crush resistant ER/CR tablet Take 1 tablet (15 mg total) by mouth Two (2) times a day. (Patient taking differently: Take 1 tablet (15 mg total) by mouth nightly.) 56 tablet 0    PONATinib (ICLUSIG) 30 mg tablet Take 1 tablet (30 mg total) by mouth daily. Swallow tablets whole. Do not crush, break, cut or chew tablets. 30 tablet 5    sulfamethoxazole-trimethoprim (BACTRIM DS) 800-160 mg per tablet TAKE 1 TABLET BY MOUTH 2 TIMES A DAY ON SATURDAY, SUNDAY. FOR PROPHYLAXIS WHILE ON CHEMO. 48 tablet 3    umeclidinium-vilanteroL (ANORO ELLIPTA) 62.5-25 mcg/actuation inhaler Inhale 1 puff daily. 60 each 11    valACYclovir (VALTREX) 500 MG tablet Take 1 tablet (500 mg total) by mouth daily. 90 tablet 11    spironolactone (ALDACTONE) 25 MG tablet Take 1 tablet (25 mg total) by mouth daily. 90 tablet 3     No current facility-administered medications for this visit.       Vital Signs for this encounter:  BSA: 2.63 meters squared  BP 129/79  - Pulse 83  - Temp 36.7 ??C (98.1 ??F) (Temporal)  - Resp 18  - Ht 188 cm (6' 2)  - Wt (!) 132.5 kg (292 lb 1.8 oz)  - SpO2 100%  - BMI 37.50 kg/m??       Physical Exam  Vitals reviewed.   Constitutional:       General: He is awake. He is not in acute distress.     Appearance: He is not ill-appearing.      Comments: Well appearing male presents to clinic with his wife   Cardiovascular:      Rate and Rhythm: Normal rate and regular rhythm.      Heart sounds: Normal heart sounds. No murmur heard.     No friction rub. No gallop.      Comments: No redness, warmth, or tenderness to BLE  Pulmonary:      Effort: Pulmonary effort is normal.      Breath sounds: Examination of the right-middle field reveals decreased breath sounds. Examination of the right-lower field reveals decreased  breath sounds. Decreased breath sounds present. No wheezing, rhonchi or rales.   Abdominal:      General: Bowel sounds are normal.      Tenderness: There is no abdominal tenderness.   Musculoskeletal:      Right lower leg: 1+ Pitting Edema present. Left lower leg: 1+ Pitting Edema present.   Skin:     General: Skin is warm and dry.   Neurological:      General: No focal deficit present.      Mental Status: He is alert and oriented to person, place, and time.      Motor: Motor function is intact.      Gait: Gait is intact.   Psychiatric:         Attention and Perception: Attention normal.         Mood and Affect: Mood normal.         Speech: Speech normal.         Behavior: Behavior is cooperative.          ECOG Performance Status: 1    Orders/Results:    Orders placed or performed in visit on 12/28/21    XR Chest 2 views     *Note: Due to a large number of results and/or encounters for the requested time period, some results have not been displayed. A complete set of results can be found in Results Review.       No results found for this or any previous visit (from the past 24 hour(s)).

## 2021-12-29 NOTE — Unmapped (Signed)
Met with Barbette Hair. Press[place] via Zoom 12/29/21 for onboarding session for the [employer] Study.      Identified needs: Referral to dietitian to discuss nutrition and appropriate diet for protein and potassium intake with kidney function and medication regimen. Already working with palliative care for pain management/neuropathy, CCSP counseling for anxiety management, and cancer rehab for deconditioning. Seen and managed by APP Lavonda Jumbo) for new onset leg swelling. Referral for Nutrition placed; awaiting scheduling.     Communication: Barbette Hair. Sherrill provided verbal confirmation that HS team has permission to reach out to emergency contact [place]ie [place], should the need arise during his time on study. Patient is aware that oncology care team remains primary contact for all urgent concerns or symptoms.     Elicia Lamp, ONN, notified of study onboarding.     Owens Shark, BSN, RN, [facility]   She/Her/Hers  Oncology Nurse Navigator   [facility]  [address], [place], [place] [address]    Voicemail: [phone]  [email or web address]

## 2021-12-30 ENCOUNTER — Ambulatory Visit: Admit: 2021-12-30 | Discharge: 2021-12-31 | Payer: MEDICAID

## 2021-12-30 ENCOUNTER — Institutional Professional Consult (permissible substitution): Admit: 2021-12-30 | Discharge: 2021-12-31 | Payer: MEDICAID | Attending: Audiologist | Primary: Audiologist

## 2021-12-30 NOTE — Unmapped (Signed)
[facility]  Department of Audiology    AUDIOLOGIC EVALUATION REPORT     PATIENT: Adam Keith, Adam Keith  DOB: June 11, 1980  MRN: [id number]  DOS: 12/30/2021    PLEASE SEE AUDIOGRAM INCLUDING FULL REPORT IN MEDIA MANAGER TAB.    HISTORY     [Pain 0/10]  Adam Keith is a 41 y.o. individual seen by Audiology for a hearing evaluation in conjunction with ENT visit; patient is seeing Dr. Noemi Chapel today. Patient was unaccompanied to today's appointment. His medical history is significant for right sided sudden SNHL and recent left sided conductive hearing loss. Today, patient reports improved hearing on the left, no change on the right side.    RESULTS     Otoscopy  RIGHT Ear: clear external auditory canal  LEFT Ear: clear external auditory canal    Tympanometry - using a 226 Hz probe tone  RIGHT Ear: Type A tympanogram, consistent with normal middle ear pressure, compliance, and volume  LEFT Ear: Type C tympanogram, consistent with negative middle ear pressure    Acoustic Reflexes  RIGHT Ipsilateral (Stim. R): absent at [phone] Hz  LEFT Ipsilateral (Stim. L): present at [phone] Hz    Pure Tone and Speech Audiometry  Today's behavioral evaluation was completed using conventional audiometry via insert earphones with good reliability.     RIGHT Ear: Profound sensorineural hearing loss  Speech Awareness Threshold (SAT): 105 dB HL  Word Recognition Score (Recorded NU-6): 0% at 100 dB HL (MCL)    LEFT Ear:  Mild to normal sensorineural hearing loss  Speech Recognition Threshold (SRT): 25 dB HL  Word Recognition Score (Recorded NU-6): 100% at 65 dB HL    IMPRESSIONS   Right ear: Profound sensorineural hearing loss with poor word recognition  Left ear: Mild to normal sensorineural hearing loss with excellent word recognition    Results are improved when compared to those on 10/28/2021.     An asymmetry is noted in thresholds between ears.  An asymmetry is noted between ears for word recognition scores today (per SPRINT chart data). Today's word recognition score in ear demonstrated unchanged performance when compared with score in previous audiogram on 10/28/2021 (per SPRINT chart data).     Patient was counseled on today's results and expressed understanding.    RECOMMENDATIONS      ENT - Seeing Dr. Melton Krebs today   Re-evaluate hearing per ENT request, sooner should concerns arise    Charges associated with this visit:  CPT 92557 - Comprehensive Audio Eval & Speech Recognition  CPT [phone] - Tympanometry       Visit Time: 30 min    Alanda Amass, AUD, AuD  Clinical Audiologist  [facility]  Scheduling [phone]

## 2021-12-30 NOTE — Unmapped (Signed)
Adam Keith is seen in consultation at the request of Dr. Gillermina Hu for the evaluation of left-sided hearing loss.    Dear Dr. Gillermina Hu,    Thank you for referring Adam Keith; as you know he is a 41 y.o. male who presents for the evaluation of left-sided hearing loss. His medical history is significant for sudden right hearing loss 1 year ago, attributed to medication. He was admitted to the ED 10/18/2021 for a Covid infection. Today he reports that he doesn't hear anything out of the right side. He was put on antibiotics during his recent hospitalization and felt like this was giving him hearing loss similar to when he lost hearing in his right ear. He has taken steroids before, but is not currently on any. He is unsure if he can take steroids with his current medication regimen. He has tried a right-sided hearing aid previously without relief. He reports a history of ear issues as a child.    INTERVAL 12/30/2021: The patient returns for follow-up. He reports improved hearing on the left and denies any changes on the right side. He reports taking the steroids I prescribed him in the interval. He notes having difficulty pinpointing sounds in a crowd.     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    Past Surgical History  His  has a past surgical history that includes Bone Marrow Biopsy & Aspiration (01/21/2020); IR Insert Port Age Greater Than 5 Years (03/10/2020); pr bronchoscopy,diagnostic w lavage (Bilateral, 07/20/2020); and chg [place], chest,real time (11/20/2020).    Past Family History  His family history includes Cancer in his maternal grandfather and mother; No Known Problems in his brother, father, maternal aunt, maternal grandmother, maternal uncle, paternal aunt, paternal grandfather, paternal grandmother, paternal uncle, and sister.    Past Social History  He  reports that he has been smoking cigarettes. He has been smoking an average of 2 packs per day. He has been exposed to tobacco smoke. He has quit using smokeless tobacco.  His smokeless tobacco use included chew. He reports that he does not currently use alcohol. He reports that he does not use drugs.    Medications/Allergies  His current medication(s) include:    Current Outpatient Medications:     amLODIPine (NORVASC) 10 MG tablet, Take 1 tablet (10 mg total) by mouth daily., Disp: 30 tablet, Rfl: 0    apixaban (ELIQUIS DVT-PE TREAT 30D START) 5 mg (74 tabs) DsPk, Take 2 tablets (10 mg total) by mouth twice daily for 7 days, then take 1 tablet (5 mg) by mouth twice daily, Disp: 70 tablet, Rfl: 1    arm brace (WRIST BRACE) Misc, 1 Piece by Miscellaneous route as needed. left ulnar wrist brace, Disp: 1 each, Rfl: 0    aspirin 81 MG chewable tablet, Chew 1 tablet (81 mg total) daily., Disp: 36 tablet, Rfl: 11    carvediloL (COREG) 12.5 MG tablet, Take 1 tablet (12.5 mg total) by mouth Two (2) times a day., Disp: 180 tablet, Rfl: 1    docusate sodium (COLACE) 100 MG capsule, Take 1 capsule (100 mg total) by mouth daily., Disp: , Rfl:     doxycycline (VIBRA-TABS) 100 MG tablet, Take 1 tablet (100 mg total) by mouth Two (2) times a day., Disp: 180 tablet, Rfl: 3    gabapentin (NEURONTIN) 300 MG capsule, Take 2 capsules (600mg ) by mouth in the morning and 3 capsules (900mg ) at  bedtime. May cause drowsiness. (Patient taking differently: 2 capsules (600 mg total) two (2) times a day. Take 1 capsules (600mg ) by mouth in the morning and 2 capsules (600mg ) at bedtime. May cause drowsiness.), Disp: 150 capsule, Rfl: 5    mirtazapine (REMERON) 15 MG tablet, TAKE 1 TABLET BY MOUTH EVERY DAY AT NIGHT, Disp: 90 tablet, Rfl: 4    mucus clearing device Devi, 1 each by Miscellaneous route Two (2) times a day., Disp: 1 each, Rfl: 0    nortriptyline (PAMELOR) 50 MG capsule, Take 2 capsules (100 mg total) by mouth nightly., Disp: , Rfl:     omeprazole (PRILOSEC) 20 MG capsule, Take 1 capsule (20 mg total) by mouth daily., Disp: , Rfl:     OXYCONTIN 15 mg 12 hr crush resistant ER/CR tablet, Take 1 tablet (15 mg total) by mouth Two (2) times a day. (Patient taking differently: Take 1 tablet (15 mg total) by mouth nightly.), Disp: 56 tablet, Rfl: 0    PONATinib (ICLUSIG) 30 mg tablet, Take 1 tablet (30 mg total) by mouth daily. Swallow tablets whole. Do not crush, break, cut or chew tablets., Disp: 30 tablet, Rfl: 5    spironolactone (ALDACTONE) 25 MG tablet, Take 1 tablet (25 mg total) by mouth daily., Disp: 90 tablet, Rfl: 3    sulfamethoxazole-trimethoprim (BACTRIM DS) 800-160 mg per tablet, TAKE 1 TABLET BY MOUTH 2 TIMES A DAY ON SATURDAY, SUNDAY. FOR PROPHYLAXIS WHILE ON CHEMO., Disp: 48 tablet, Rfl: 3    umeclidinium-vilanteroL (ANORO ELLIPTA) 62.5-25 mcg/actuation inhaler, Inhale 1 puff daily., Disp: 60 each, Rfl: 11    valACYclovir (VALTREX) 500 MG tablet, Take 1 tablet (500 mg total) by mouth daily., Disp: 90 tablet, Rfl: 11  Allergies: Bupropion hcl, Cefepime, Ceftaroline fosamil, Dapsone, Onion, Vancomycin analogues, Bismuth subsalicylate, Privigen [immun glob g(igg)-pro-iga 0-50], Furosemide, and Gammagard,    Review of systems   Review of systems was reviewed on attached notes/patient intake forms.      Physical Examination  Temp 36.3 ??C (97.4 ??F)  - Ht 188 cm (6' 2)  - Wt (!) 127 kg (280 lb)  - BMI 35.95 kg/m??     General: well appearing, stated age, no distress   Communicates age appropriate, responds to speech well  Head - atraumatic, normocephalic   Face - no surface abnormalities, no tenderness over sinuses   Eyes - no conjunctivitis, no scleritis/iritis, EOM full   Nose - external nose without deformity, anterior rhinoscopy - turbinates, mucosa normal   Oral Cavity/Oropharynx/Lips:  Normal mucous membranes, normal floor of mouth/tongue/OP, no masses or lesions are noted.  Salivary Glands - no mass or asymmetry, no tenderness   Neck - no palpable masses/adenopathy, no thyromegaly   Lymphatic - no neck lymphadenopathy, no lymphedema   Cardiovascular - regular heart rate, no clubbing/cyanosis/edema in hands  Respiratory - breathing comfortably, no audible wheezing or stridor  Psychiatric- appropriate mood and affect  Neurologic - cranial nerves 2-12 intact  Facial Strength - HB 1/6 bilaterally    Ears - External ear- normal, no lesions, no malformations   Otoscopy - R: normal EAC, intact TM, clear middle ear space. L: normal EAC, intact TM, clear middle ear space.     Tuning fork test -   Not performed    Audiogram  The audiogram was personally reviewed and interpreted. This demonstrates profound sensorineural hearing loss on the right and mild to normal sensorineural hearing loss on the left. 100% word recognition score on the left. Results improved  since last visit. Stable profound loss on right               Prior:        Tympanogram  The tympanogram was personally reviewed and interpreted. This demonstrates type A on the right and type C on the left.    Imaging  I personally reviewed and interpreted the patients imaging studies. 10/11/2021 CT Sinus did not reveal significant effusion within the middle ear.     I reviewed outside medical records.    Assessment and Plan  The patient is a 41 y.o. male with bilateral hearing loss. He reports no hearing in his right ear and has relatively improved left side hearing on today's audiogram compared to previous. We discussed the possible etiologies of his hearing loss at length. I recommended consideration of steroids for treatment of his hearing loss. We had a thorough discussion about treatment options. We discussed the risks and benefits of BiCROS hearing aids versus cochlear implantation. He will consider both options and contact me if he wishes to proceed with surgery. He should follow-up with Audiology in 1 year for an audiogram . He can contact me with any questions or concerns.    I appreciate the opportunity to participate in his care.    This record has been created using voice recognition software and may have errors that were not dictated and not seen in editing.    Teaching Attestation: Dr. Melton Krebs saw and evaluated the patient and participated in all aspects of the patient encounter and all procedures.    ---------------------------  Scribe's Attestation: Noemi Chapel, MD obtained and performed the history, physical exam and medical decision making elements that were entered into the chart. Signed by Claudean Kinds, Scribe, on December 30, 2021 at 2:00 PM.    ----------------------------------------------------------------------------------------------------------------------  January 03, 2022 11:51 AM. Documentation assistance provided by the Scribe. I was present during the time the encounter was recorded. The information recorded by the Scribe was done at my direction and has been reviewed and validated by me.  ----------------------------------------------------------------------------------------------------------------------

## 2022-01-03 DIAGNOSIS — C9101 Acute lymphoblastic leukemia, in remission: Principal | ICD-10-CM

## 2022-01-06 NOTE — Unmapped (Signed)
In this week's [employer] coaching call, [name] discussed excitement for strength training. He wanted to participate in a failure test to determine his baseline fitness level. His coach recommended medical clearance and the potential need for medical staff around for safety reasons. He reported continued BLE edema (recently started on spironolactone) and new/worsening left knee pain.     I called [name] to re-enforce the need to discuss the failure test with his medical team prior to participating. He told me that he plans to message his team about his continued BLE edema, new/worsening left knee pain, and the failure test. He plans to follow-up with me after reaching out to his care team.     Contacting care team directly to make aware of [name]'s symptoms and to give context behind failure test.

## 2022-01-07 DIAGNOSIS — C9100 Acute lymphoblastic leukemia not having achieved remission: Principal | ICD-10-CM

## 2022-01-07 NOTE — Unmapped (Cosign Needed)
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 The participant reports that since the last call, there were no unplanned ED visits OR hospitalizations. The participant did not mention any recent changes to treatment, disease course, or discussions with the clinical team. This was our first call, and Adam Keith was eager to set goals and create healthy behaviors.      Coaching Call Review 01/05/22  This was our first call, Adam Keith decided to focus on nutrition and exercise.  His primary goal is to replace his fat mass with lean mass.  He is frustrated by his inability to do the things he wants to do independently.  Previously he felt like he could do most strength based activities with out assistance.  Adam Keith is fatigued and having issues with pain.  He feels that his back and hip pain are mostly related to his weight.  He continues to have swelling his lower limbs.     Weekly Report for week starting 01/04/2022  Please click below to view a more detailed report on our [employer] website:  weekly report 01/06/2022 * Please hold the shift key when clicking the hyperlink to access your patient???s report   The following symptoms have been flagged with an alert this week 01/04/2022:  Anxiety, Pain, Shortness of Breath    New SMART Goals:     Movement:   Domain discussed: yes   New goal set   SMART Goal 1: He will message his doctor today (10/4) about doing exercise to one exercise test to failure (push ups, sit ups, and plank). He will include questions about his edema, blood clot, and new knee pain.   SMART Goal 1 was developed by: solely by coach     SMART Goal 2: If his doctor approves him doing exercises to failure, he will perform an exercise test on himself to see how many reps he can perform of situps, pushups and plank (max number of seconds).  He will let me know the results to determine reps and sets for consistent workouts.    SMART Goal 2 was developed by: coach and participant together      SMART Goal 3: He will walk 20 minutes 3 times next week   SMART Goal 3: was developed by: solely by coach      Mood:   Domain discussed:  yes   New goal set  SMART Goal 1: starting Monday, Adam Keith will drink 2-3 protein shakes each day to ensure that he is getting enough protein    SMART Goal 1 was developed by: participant      SMART Goal 2: starting Monday, Adam Keith will replace daily [place] breakfast with eggs or cottage cheese and fruit     SMART Goal 2: was developed by: participant      SMART Goal 3: starting Monday, Adam Keith will use his grill to make sure he has healthy lunch options for the week while he is at work  SMART Goal 3: was developed by: participant      Meaning:   Domain discussed: Yes   No goal set        Coaching Call Detailed Notes 01/05/22       Symptoms:   Adam Keith reported continued swelling in his lower legs and new knee pain.  He mentioned that it felt like it did before he had surgery on his other knee.  He  also reported significant day-time pain (7-10) now that he is not taking his oxy.      Movement:   Movement was discussed this call.    In the last week Adam Keith completed 0 bouts of intentional exercise.  Adam Keith  is not working on any IADLS/ADL tasks at this time.  He is eager to get started and was waiting on the program to start.     Adam Keith reports the following changes in their physical function: Adam Keith is a person who is headstrong and reported being an ???all or nothing??? mentality.  He reported how his function has changed drastically over the past year and how disconcerting that is for him.  He reported being concerned that if he doesn???t make a change he will die of a heart attack.     Adam Keith plans to work on figuring out his baseline strength and walking this week.  These goals are an 10 on an importance scale of 1-10 and Adam Keith indicated that  he is an 9 on a confidence scale of 1-10.  He is motivated by becoming more functional and having more energy.     Additionally, we discussed these items related to movement: we talked about safety and how the idea of all or nothing doesn???t always work with exercise.  We talked about not wanting to get injured and the risk of what happens if he does (not working and not exercising).   He is open to slowing down, but was adamant about doing a max test to assess his baseline.  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        Mood:   Mood was discussed this call.    In the last week, Adam Keith reports no new stressors.      Adam Keith indicated that  his weight is affecting his wellness in the following ways: he is low energy, feels physically weak, and feels frustrated that he can???t do the things he wants to do.     Adam Keith did set a SMART goal to help with the management of this issue.     Additionally, we discussed these other items related to mood: Nutrition.  We spent a lot of time talking about his history with supplements and how he feels that he would benefit from them, but understands that they could affect his treatment.  We talked about micronutrients making sure he is getting lots of vegetables to take the place of the multi-vitamin he wants to take.  He is excited to start working his protein goals.  He has lots of knowledge about what???s healthy and what isn???t and was able to create a list of healthy snacks and what he needs to have a healthier eating week next week.  He mentioned that his wife has already started her diet and he is eager to support her as he starts his.     Adam Keith plans to work on new goals this week- he will stop eating daily [place], increase his protein so he is meeting his required amount, and will plan his meals for lunch at work so he doesn???t eat fast food. These goals are an 10 on an importance scale of and a 9 on a confidence scale of 1-10. Adam Keith expressed fears of dying of a heart attack if he doesn???t make a change.       Meaning:   Meaning was discussed this call.     In the  last week, Adam Keith reports no new changes in disease status, or uncertainty about future related to their disease. Adam Keith did not indicate interest in learning about  advanced care planning, health care decision making, or lack of understanding of how things are going with their cancer overall.     Adam Keith indicated that these are his current hopes: he feels that he has a second lease on life and is hopeful that this time around he wont make the same mistakes with his health and actions.    Resource Nurse Outreach (10/5-12:32pm):  In this week's [employer] coaching call, Adam Keith discussed excitement for strength training. He wanted to participate in a failure test to determine his baseline fitness level. His coach recommended medical clearance and the potential need for medical staff around for safety reasons. He reported continued BLE edema (recently started on spironolactone) and new/worsening left knee pain.      I called Adam Keith to re-enforce the need to discuss the failure test with his medical team prior to participating. Has anxiety about ongoing leg swelling and new knee pain. Encouraged him to get clarification from onc team about treatment plan. He told me that he plans to message his team about his continued BLE edema, new/worsening left knee pain, and the failure test. SOB continues and team is already aware. Encouraged compliance to COPD meds. He plans to follow-up with me after reaching out to his care team.      Contacted care team directly to make aware of Adam Keith's symptoms and to give context behind failure test.

## 2022-01-07 NOTE — Unmapped (Signed)
Telephone Call    I spoke with the patient to follow up on increased edema and start of spironolactone.    Since starting his weight has fluctuated up/down without a clear trend. He still is experiencing lower extremity swelling - more significant on work days.No other new complaints.    I recommended:  - Repeat labs Monday at [facility] to check CMP, BCR/ABL, CBC/d.  - If serum creatinine and potassium are stable, will increase the dose of spironolactone  - Echocardiogram next Friday to re-evaluate heart function  - the BCR/ABL order is standing - plan for q 4 week checks.  - I will follow up with him after the Echocardiogram results are available.    [name], AGPCNP-BC  Nurse Practitioner  Hematology/Oncology  [facility]

## 2022-01-10 ENCOUNTER — Ambulatory Visit: Admit: 2022-01-10 | Discharge: 2022-01-11 | Payer: MEDICAID

## 2022-01-10 DIAGNOSIS — C9100 Acute lymphoblastic leukemia not having achieved remission: Principal | ICD-10-CM

## 2022-01-10 LAB — CBC W/ AUTO DIFF
BASOPHILS ABSOLUTE COUNT: 0.1 10*9/L (ref 0.0–0.1)
BASOPHILS RELATIVE PERCENT: 1.3 %
EOSINOPHILS ABSOLUTE COUNT: 0.4 10*9/L (ref 0.0–0.5)
EOSINOPHILS RELATIVE PERCENT: 3.8 %
HEMATOCRIT: 47 % (ref 39.0–48.0)
HEMOGLOBIN: 16.5 g/dL (ref 12.9–16.5)
LYMPHOCYTES ABSOLUTE COUNT: 2.4 10*9/L (ref 1.1–3.6)
LYMPHOCYTES RELATIVE PERCENT: 24.9 %
MEAN CORPUSCULAR HEMOGLOBIN CONC: 35.1 g/dL (ref 32.0–36.0)
MEAN CORPUSCULAR HEMOGLOBIN: 30.3 pg (ref 25.9–32.4)
MEAN CORPUSCULAR VOLUME: 86.4 fL (ref 77.6–95.7)
MEAN PLATELET VOLUME: 8.9 fL (ref 6.8–10.7)
MONOCYTES ABSOLUTE COUNT: 0.7 10*9/L (ref 0.3–0.8)
MONOCYTES RELATIVE PERCENT: 7.2 %
NEUTROPHILS ABSOLUTE COUNT: 6 10*9/L (ref 1.8–7.8)
NEUTROPHILS RELATIVE PERCENT: 62.8 %
PLATELET COUNT: 223 10*9/L (ref 150–450)
RED BLOOD CELL COUNT: 5.45 10*12/L (ref 4.26–5.60)
RED CELL DISTRIBUTION WIDTH: 15.7 % — ABNORMAL HIGH (ref 12.2–15.2)
WBC ADJUSTED: 9.5 10*9/L (ref 3.6–11.2)

## 2022-01-10 LAB — COMPREHENSIVE METABOLIC PANEL
ALBUMIN: 4 g/dL (ref 3.4–5.0)
ALKALINE PHOSPHATASE: 103 U/L (ref 46–116)
ALT (SGPT): 55 U/L — ABNORMAL HIGH (ref 10–49)
ANION GAP: 6 mmol/L (ref 5–14)
BILIRUBIN TOTAL: 0.3 mg/dL (ref 0.3–1.2)
BLOOD UREA NITROGEN: 14 mg/dL (ref 9–23)
BUN / CREAT RATIO: 8
CALCIUM: 9.3 mg/dL (ref 8.7–10.4)
CHLORIDE: 108 mmol/L — ABNORMAL HIGH (ref 98–107)
CO2: 23.8 mmol/L (ref 20.0–31.0)
CREATININE: 1.78 mg/dL — ABNORMAL HIGH
EGFR CKD-EPI (2021) MALE: 48 mL/min/{1.73_m2} — ABNORMAL LOW (ref >=60)
GLUCOSE RANDOM: 115 mg/dL — ABNORMAL HIGH (ref 70–99)
PROTEIN TOTAL: 6.1 g/dL (ref 5.7–8.2)
SODIUM: 138 mmol/L (ref 135–145)

## 2022-01-10 LAB — SLIDE REVIEW

## 2022-01-12 NOTE — Unmapped (Signed)
[facility] Refill Coordination Note    Specialty Medication(s) to be Shipped:   Hematology/Oncology: Iclusig    Other medication(s) to be shipped: No additional medications requested for fill at this time     Adam Keith, DOB: Dec 23, 1980  Phone: [phone] (home)       All above HIPAA information was verified with patient.     Was a Nurse, learning disability used for this call? No    Completed refill call assessment today to schedule patient's medication shipment from the [facility] [phone]).  All relevant notes have been reviewed.     Specialty medication(s) and dose(s) confirmed: Regimen is correct and unchanged.   Changes to medications: [name] reports starting the following medications: Spironolactone and Eliquis  Changes to insurance: No  New side effects reported not previously addressed with a pharmacist or physician: Yes - Patient reports swelling and blood clot. Patient would not like to speak to the pharmacist today. Their provider is aware.  Questions for the pharmacist: No    Confirmed patient received a Conservation officer, historic buildings and a Surveyor, mining with first shipment. The patient will receive a drug information handout for each medication shipped and additional FDA Medication Guides as required.       DISEASE/MEDICATION-SPECIFIC INFORMATION        N/A    SPECIALTY MEDICATION ADHERENCE     Medication Adherence    Patient reported X missed doses in the last month: 0  Specialty Medication: Iclusig 30mg   Patient is on additional specialty medications: No  Informant: patient                       Were doses missed due to medication being on hold? No    Iclusig 30 mg: 14 days of medicine on hand       REFERRAL TO PHARMACIST     Referral to the pharmacist: Not needed      [facility]     Shipping address confirmed in Epic.     Delivery Scheduled: Yes, Expected medication delivery date: 01/21/22.     Medication will be delivered via UPS to the prescription address in Epic [place].    [name]   [facility] Specialty Technician

## 2022-01-14 ENCOUNTER — Ambulatory Visit: Admit: 2022-01-14 | Discharge: 2022-01-15 | Payer: MEDICAID

## 2022-01-14 MED ORDER — BUPRENORPHINE 5 MCG/HOUR WEEKLY TRANSDERMAL PATCH
MEDICATED_PATCH | TRANSDERMAL | 0 refills | 28 days | Status: CP
Start: 2022-01-14 — End: 2022-02-11

## 2022-01-15 MED ORDER — OXYCODONE ER 10 MG TABLET,CRUSH RESISTANT,EXTENDED RELEASE 12 HR
ORAL_TABLET | Freq: Every evening | ORAL | 0 refills | 28 days | Status: CP
Start: 2022-01-15 — End: 2022-02-12

## 2022-01-15 NOTE — Unmapped (Signed)
Addended by: Marita Kansas on: 01/15/2022 08:51 AM     Modules accepted: Orders

## 2022-01-17 DIAGNOSIS — C9101 Acute lymphoblastic leukemia, in remission: Principal | ICD-10-CM

## 2022-01-18 MED ORDER — HYDROCHLOROTHIAZIDE 12.5 MG TABLET
ORAL_TABLET | Freq: Every day | ORAL | 11 refills | 30 days | Status: CP
Start: 2022-01-18 — End: 2023-01-18

## 2022-01-19 ENCOUNTER — Ambulatory Visit: Admit: 2022-01-19 | Discharge: 2022-01-22 | Payer: MEDICAID

## 2022-01-19 ENCOUNTER — Encounter
Admit: 2022-01-19 | Discharge: 2022-01-22 | Disposition: A | Discharge status: Home / Self Care | Payer: MEDICAID | Attending: Hematology | Admitting: Hematology | Primary: Hematology

## 2022-01-19 ENCOUNTER — Ambulatory Visit
Admit: 2022-01-19 | Discharge: 2022-01-22 | Disposition: A | Discharge status: Home / Self Care | Payer: MEDICAID | Admitting: Hematology

## 2022-01-19 LAB — APTT
APTT: 35.8 s (ref 24.8–38.4)
HEPARIN CORRELATION: 0.2

## 2022-01-19 LAB — COMPREHENSIVE METABOLIC PANEL
ALBUMIN: 4.4 g/dL (ref 3.4–5.0)
ALKALINE PHOSPHATASE: 98 U/L (ref 46–116)
ALT (SGPT): 48 U/L (ref 10–49)
ANION GAP: 7 mmol/L (ref 5–14)
AST (SGOT): 25 U/L (ref <=34)
BILIRUBIN TOTAL: 0.4 mg/dL (ref 0.3–1.2)
BLOOD UREA NITROGEN: 18 mg/dL (ref 9–23)
BUN / CREAT RATIO: 11
CALCIUM: 9 mg/dL (ref 8.7–10.4)
CHLORIDE: 107 mmol/L (ref 98–107)
CO2: 25 mmol/L (ref 20.0–31.0)
CREATININE: 1.67 mg/dL — ABNORMAL HIGH
EGFR CKD-EPI (2021) MALE: 52 mL/min/{1.73_m2} — ABNORMAL LOW (ref >=60)
GLUCOSE RANDOM: 113 mg/dL (ref 70–179)
POTASSIUM: 4.9 mmol/L — ABNORMAL HIGH (ref 3.4–4.8)
PROTEIN TOTAL: 6.7 g/dL (ref 5.7–8.2)
SODIUM: 139 mmol/L (ref 135–145)

## 2022-01-19 LAB — CBC W/ AUTO DIFF
BASOPHILS ABSOLUTE COUNT: 0.1 10*9/L (ref 0.0–0.1)
BASOPHILS RELATIVE PERCENT: 1.4 %
EOSINOPHILS ABSOLUTE COUNT: 0.3 10*9/L (ref 0.0–0.5)
EOSINOPHILS RELATIVE PERCENT: 3.1 %
HEMATOCRIT: 48.3 % — ABNORMAL HIGH (ref 39.0–48.0)
HEMOGLOBIN: 17 g/dL — ABNORMAL HIGH (ref 12.9–16.5)
LYMPHOCYTES ABSOLUTE COUNT: 1.7 10*9/L (ref 1.1–3.6)
LYMPHOCYTES RELATIVE PERCENT: 18.9 %
MEAN CORPUSCULAR HEMOGLOBIN CONC: 35.2 g/dL (ref 32.0–36.0)
MEAN CORPUSCULAR HEMOGLOBIN: 30.1 pg (ref 25.9–32.4)
MEAN CORPUSCULAR VOLUME: 85.4 fL (ref 77.6–95.7)
MEAN PLATELET VOLUME: 8.6 fL (ref 6.8–10.7)
MONOCYTES ABSOLUTE COUNT: 0.7 10*9/L (ref 0.3–0.8)
MONOCYTES RELATIVE PERCENT: 7.8 %
NEUTROPHILS ABSOLUTE COUNT: 6.3 10*9/L (ref 1.8–7.8)
NEUTROPHILS RELATIVE PERCENT: 68.8 %
PLATELET COUNT: 187 10*9/L (ref 150–450)
RED BLOOD CELL COUNT: 5.65 10*12/L — ABNORMAL HIGH (ref 4.26–5.60)
RED CELL DISTRIBUTION WIDTH: 16.1 % — ABNORMAL HIGH (ref 12.2–15.2)
WBC ADJUSTED: 9.2 10*9/L (ref 3.6–11.2)

## 2022-01-19 LAB — URINALYSIS WITH MICROSCOPY WITH CULTURE REFLEX
BACTERIA: NONE SEEN /HPF
BILIRUBIN UA: NEGATIVE
BLOOD UA: NEGATIVE
GLUCOSE UA: NEGATIVE
KETONES UA: NEGATIVE
LEUKOCYTE ESTERASE UA: NEGATIVE
NITRITE UA: NEGATIVE
PH UA: 5.5 (ref 5.0–9.0)
PROTEIN UA: 30 — AB
RBC UA: <1 /HPF (ref <=3)
SPECIFIC GRAVITY UA: 1.024 (ref 1.003–1.030)
SQUAMOUS EPITHELIAL: <1 /HPF (ref 0–5)
UROBILINOGEN UA: 2 — AB
WBC UA: 1 /HPF (ref <=2)

## 2022-01-19 LAB — PROTIME-INR
INR: 0.94
PROTIME: 10.6 s (ref 9.9–12.6)

## 2022-01-19 MED ADMIN — morphine 4 mg/mL injection 4 mg: 4 mg | INTRAVENOUS | @ 23:00:00 | Stop: 2022-01-19

## 2022-01-19 MED ADMIN — morphine 4 mg/mL injection 4 mg: 4 mg | INTRAVENOUS | @ 21:00:00 | Stop: 2022-01-19

## 2022-01-19 MED ADMIN — ondansetron (ZOFRAN) injection 4 mg: 4 mg | INTRAVENOUS | @ 21:00:00 | Stop: 2022-01-19

## 2022-01-19 NOTE — Unmapped (Signed)
Pt concerned for blood clot in his groin. Pt woke up to sharp pain shooting from his testicles into his lower abdomen around 0200 today. Pain has not subsided. Pt has dyspnea while laying flat and with exertion but pt says this has been going on since his cancer diagnosis 2 years ago. LLE DVT was 2 weeks ago. Currently receiving PO chemo nightly.

## 2022-01-19 NOTE — Unmapped (Signed)
[facility] Emergency Department Provider Note      ED Clinical Impression     Final diagnoses:   Left testicular pain (Primary)       HPI, ED Course, Assessment and Plan     Initial Clinical Impression:    January 19, 2022 4:43 PM   Adam Keith is a 41 y.o. 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 The patient reports acute onset of testicular pain that shoots up into his suprapubic abdomen with associated nausea that awoke him from sleep at 2:00 AM today. He states that the pain is worsened when anything touches his groin or upon standing/sitting upright. His pain has been uncontrolled despite taking his prescribed 10 mg oxycodone. Of note, the patient was found to have acute DVT of the LLE several weeks ago on 12/09/21, after which he has been on Eliquis and Lasix with steady relief of leg soreness and swelling. He is also on chronic doxycycline BID due to his history of MRSA PNA and bacteremia. Denies swelling, lesions, or discharge to/from the testicles or penis. No new sexual partners, and the patient has not had intercourse in the past 2 years. Denies fevers or chills.     BP 140/99  - Pulse 93  - Temp 36.6 ??C (97.9 ??F) (Oral)  - Resp 22  - Wt (!) 129.3 kg (285 lb)  - SpO2 98%  - BMI 36.59 kg/m??     On exam, patient is significantly uncomfortable appearing but nontoxic.  Vital signs without significant abnormalities.  Afebrile.  Abdomen is soft, nondistended, with tenderness to palpation across the bilateral groins.  Testicular exam is notable for mildly firm testicles with left testicle being significantly tender to touch.  Neither without any testicular edema.  No testicular erythema or overlying skin changes to the groin or genitals.  No wounds or lesions or skin breakdown without any concern for Fournier's gangrene.  Differential includes testicular torsion, epididymitis, extension of DVT higher in the abdomen despite patient being on Eliquis, inguinal hernia, versus acute intra-abdominal pathology.  Obtain blood work as well as testicular and scrotal ultrasound will give morphine and Zofran for pain and nausea control.      Further ED updates and updates to plan as per ED Course below:    ED Course:  ED Course as of 01/20/22 0214   Wed Jan 19, 2022   1804 CBC without leukocytosis.  Hemoglobin 17 with hematocrit of 48.3.  CMP with mildly elevated potassium to 4.9.  Creatinine is stable at baseline at 1.67.  No other significant abnormalities on CMP.   1932 US Scrotum and Testes  IMPRESSION:  Left testicle is heterogenous and hyperemic. Differential is edema from vascular congestion versus orchitis or leukemic involvement. Normal arterial and venous waveforms are present.  No abnormalities of the right testis.    We will speak to urology regarding his ultrasound findings     2023 Urology states that leukemic involvement is very rare in adults and is typically only seen in children.  Per imaging he is it is probably orchitis and would recommend continuing with obtaining UA and culture.  He also suggested it is possible that vascular congestion could be due to extension of known DVT up to blockage of the renal vein.  In the setting and patient's continued persistent pain, will obtain CT abdomen pelvis to look for extension of the thrombus.  Spoke to  radiology who reported clysed that as CTV abdomen and pelvis with runoff to the knees   Thu Jan 20, 2022   0023 Patient declining any CT scan with contrast.  Patient reports that per his infectious disease doctor and oncologist, he is not allowed to have contrast due to his kidney function.  Discussed that patient's kidney function is currently within our normal criteria to use contrasted scans.  Discussed the risks and benefits of not obtaining CT scan with contrast.  We discussed that we would be unable to evaluate whether this clot was extending which could be life-threatening.  Patient expressed understanding and continues to decline.  He has medical capacity to make this decision.  Decision was made to obtain CT abdomen and pelvis without contrast despite limited study to evaluate for other acute intra-abdominal causes of pain.       Social Determinants of Health with Concerns     Internet Connectivity: Not on file   Tobacco Use: High Risk (01/03/2022)    Patient History    ??? Smoking Tobacco Use: Every Day    ??? Smokeless Tobacco Use: Former    ??? Passive Exposure: Current   Alcohol Use: Not on file   Substance Use: Not on file   Health Literacy: Not on file   Physical Activity: Not on file   Interpersonal Safety: Not on file   Stress: Not on file   Intimate Partner Violence: Not on file   Depression: Not on file   Social Connections: Not on file     _____________________________________________________________________    The case was discussed with the attending physician who is in agreement with the above assessment and plan    Additional Medical Decision Making     I have reviewed the vital signs and the nursing notes. Labs and radiology results that were available during my care of the patient were independently reviewed by me and considered in my medical decision making.   I independently visualized the EKG tracing if performed  I independently visualized the radiology images if performed  I reviewed the patient's prior medical records if available.  Additional history obtained from family if available.  For specific reads/information impacting care please refer to MDM/ED Course continued documentation    Past History     PAST MEDICAL HISTORY/PAST SURGICAL HISTORY:   Past Medical History:   Diagnosis Date   ??? 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 BONE MARROW BIOPSY & ASPIRATION  01/21/2020        ??? CHG [place], CHEST,REAL TIME  11/20/2020    Procedure: ULTRASOUND, CHEST, REAL TIME WITH IMAGE DOCUMENTATION;  Surgeon: Jerelyn Charles, MD;  Location: BRON[facility] PROCEDURE LAB [facility];  Service: Pulmonary   ??? IR INSERT PORT AGE GREATER THAN 5 YRS  03/10/2020    IR INSERT PORT AGE GREATER THAN 5 YRS 03/10/2020 [name], MD IMG VIR [facility]   ??? PR BRONCHOSCOPY,DIAGNOSTIC W LAVAGE Bilateral 07/20/2020    Procedure: BRONCHOSCOPY, RIGID OR FLEXIBLE, INCLUDE FLUOROSCOPIC GUIDANCE WHEN PERFORMED; W/BRONCHIAL ALVEOLAR LAVAGE WITH MODERATE SEDATION;  Surgeon: Dellis Filbert, MD;  Location: BRON[facility] PROCEDURE LAB [facility];  Service: Pulmonary       MEDICATIONS:   No current facility-administered medications for this encounter.    Current Outpatient Medications:   ???  amLODIPine (NORVASC) 10 MG tablet, Take 1 tablet (10 mg total) by mouth daily.,  Disp: 30 tablet, Rfl: 0  ???  apixaban (ELIQUIS DVT-PE TREAT 30D START) 5 mg (74 tabs) DsPk, Take 2 tablets (10 mg total) by mouth twice daily for 7 days, then take 1 tablet (5 mg) by mouth twice daily, Disp: 70 tablet, Rfl: 1  ???  arm brace (WRIST BRACE) Misc, 1 Piece by Miscellaneous route as needed. left ulnar wrist brace, Disp: 1 each, Rfl: 0  ???  aspirin 81 MG chewable tablet, Chew 1 tablet (81 mg total) daily., Disp: 36 tablet, Rfl: 11  ???  buprenorphine 5 mcg/hour PTWK transdermal patch, Place 1 patch on the skin every seven (7) days for 28 days., Disp: 4 patch, Rfl: 0  ???  carvediloL (COREG) 12.5 MG tablet, Take 1 tablet (12.5 mg total) by mouth Two (2) times a day., Disp: 180 tablet, Rfl: 1  ???  docusate sodium (COLACE) 100 MG capsule, Take 1 capsule (100 mg total) by mouth daily., Disp: , Rfl:   ???  doxycycline (VIBRA-TABS) 100 MG tablet, Take 1 tablet (100 mg total) by mouth Two (2) times a day., Disp: 180 tablet, Rfl: 3  ???  gabapentin (NEURONTIN) 300 MG capsule, Take 2 capsules (600mg ) by mouth in the morning and 3 capsules (900mg ) at bedtime. May cause drowsiness. (Patient taking differently: 2 capsules (600 mg total) two (2) times a day. Take 1 capsules (600mg ) by mouth in the morning and 2 capsules (600mg ) at bedtime. May cause drowsiness.), Disp: 150 capsule, Rfl: 5  ???  hydroCHLOROthiazide (HYDRODIURIL) 12.5 MG tablet, Take 1 tablet (12.5 mg total) by mouth daily., Disp: 30 tablet, Rfl: 11  ???  mirtazapine (REMERON) 15 MG tablet, TAKE 1 TABLET BY MOUTH EVERY DAY AT NIGHT, Disp: 90 tablet, Rfl: 4  ???  mucus clearing device Devi, 1 each by Miscellaneous route Two (2) times a day., Disp: 1 each, Rfl: 0  ???  nortriptyline (PAMELOR) 50 MG capsule, Take 2 capsules (100 mg total) by mouth nightly., Disp: , Rfl:   ???  omeprazole (PRILOSEC) 20 MG capsule, Take 1 capsule (20 mg total) by mouth daily., Disp: , Rfl:   ???  oxyCODONE (OXYCONTIN) 10 mg TR12 12 hr crush resistant ER/CR tablet, Take 1 tablet (10 mg total) by mouth nightly for 28 days., Disp: 28 tablet, Rfl: 0  ???  PONATinib (ICLUSIG) 30 mg tablet, Take 1 tablet (30 mg total) by mouth daily. Swallow tablets whole. Do not crush, break, cut or chew tablets., Disp: 30 tablet, Rfl: 5  ???  spironolactone (ALDACTONE) 25 MG tablet, Take 1 tablet (25 mg total) by mouth daily., Disp: 90 tablet, Rfl: 3  ???  sulfamethoxazole-trimethoprim (BACTRIM DS) 800-160 mg per tablet, TAKE 1 TABLET BY MOUTH 2 TIMES A DAY ON SATURDAY, SUNDAY. FOR PROPHYLAXIS WHILE ON CHEMO., Disp: 48 tablet, Rfl: 3  ???  umeclidinium-vilanteroL (ANORO ELLIPTA) 62.5-25 mcg/actuation inhaler, Inhale 1 puff daily., Disp: 60 each, Rfl: 11  ???  valACYclovir (VALTREX) 500 MG tablet, Take 1 tablet (500 mg total) by mouth daily., Disp: 90 tablet, Rfl: 11    ALLERGIES:   Bupropion hcl, Cefepime, Ceftaroline fosamil, Dapsone, Onion, Vancomycin analogues, Bismuth subsalicylate, Privigen [immun glob g(igg)-pro-iga 0-50], Furosemide, and Gammagard    SOCIAL HISTORY:   Social History     Tobacco Use   ??? Smoking status: Every Day     Packs/day: 2     Types: Cigarettes     Passive exposure: Current   ??? Smokeless tobacco: Former     Types: Sports administrator  Substance Use Topics   ??? Alcohol use: Not Currently FAMILY HISTORY:  Family History   Problem Relation Age of Onset   ??? Cancer Mother    ??? No Known Problems Father    ??? No Known Problems Sister    ??? No Known Problems Brother    ??? No Known Problems Maternal Aunt    ??? No Known Problems Maternal Uncle    ??? No Known Problems Paternal Aunt    ??? No Known Problems Paternal Uncle    ??? No Known Problems Maternal Grandmother    ??? Cancer Maternal Grandfather    ??? No Known Problems Paternal Grandmother    ??? No Known Problems Paternal Grandfather    ??? Melanoma Neg Hx    ??? Basal cell carcinoma Neg Hx    ??? Squamous cell carcinoma Neg Hx           Review of Systems     A review of systems was performed and relevant portions were as noted above in HPI     Physical Exam     VITAL SIGNS:    BP 164/103  - Pulse 102  - Temp 36.6 ??C (97.9 ??F) (Oral)  - Resp 18  - Wt (!) 129.3 kg (285 lb)  - SpO2 98%  - BMI 36.59 kg/m??     Constitutional:   ??? Alert and oriented.   Head:   ??? Normocephalic and atraumatic  Eyes:   ??? Conjunctivae are normal, EOMI, PERRL  ENT:   ??? No notable congestion, Mucous membranes moist, External ears normal, no notable stridor  Cardiovascular:   ??? Rate as vitals above. Appears warm and well perfused  Respiratory:   ??? Normal respiratory effort. Breath sounds are normal.  Gastrointestinal:   ??? Abdomen is soft, mildly distended with moderate tenderness to palpation of the LLQ.    Genitourinary:   ??? More tenderness noted to L groin and L testicle without any significant testicular swelling. No palpable masses. No overlying wounds, lesions, or erythema. No penile discharge or lesions.  Musculoskeletal:    ??? Normal range of motion in all extremities. No tenderness or edema noted in B/L lower extremities  Neurologic:   ??? No gross focal neurologic deficits beyond baseline are appreciated.  Skin:   ??? Skin is warm, dry and intact.     Radiology     CT Abdomen Pelvis Wo Contrast   Final Result   No evidence of acute intra-abdominal/pelvic pathology.         US Scrotum and Testes   Final Result      Left testicle is heterogenous and hyperemic. Differential is edema from vascular congestion versus orchitis or leukemic involvement. Normal arterial and venous waveforms are present.      No abnormalities of the right testis.          Labs     Labs Reviewed   COMPREHENSIVE METABOLIC PANEL - Abnormal; Notable for the following components:       Result Value    Potassium 4.9 (*)     Creatinine 1.67 (*)     eGFR CKD-EPI (2021) Male 40 (*)     All other components within normal limits   URINALYSIS WITH MICROSCOPY WITH CULTURE REFLEX - Abnormal; Notable for the following components:    Protein, UA 30 mg/dL (*)     Urobilinogen, UA 2.0 mg/dL (*)     All other components within normal limits   CBC W/ AUTO DIFF -  Abnormal; Notable for the following components:    RBC 5.65 (*)     HGB 17.0 (*)     HCT 48.3 (*)     RDW 16.1 (*)     Anisocytosis Slight (*)     All other components within normal limits   CBC W/ DIFFERENTIAL    Narrative:     The following orders were created for panel order CBC w/ Differential.  Procedure                               Abnormality         Status                     ---------                               -----------         ------                     CBC w/ Differential[[id number]]         Abnormal            Final result                 Please view results for these tests on the individual orders.   PROTIME-INR   APTT         Pertinent labs & imaging results that were available during my care of the patient were reviewed by me and considered in my medical decision making (see chart for details).    Please note- This chart has been created using [employer]. Chart creation errors have been sought, but may not always be located and such creation errors, especially pronoun confusion, do NOT reflect on the standard of medical care.    Documentation assistance was provided by Lorrine Kin, Scribe, on January 19, 2022 at 4:52 PM for Magda Bernheim, MD.    Documentation assistance provided by the above mentioned scribe. I was present during the time the encounter was recorded. The information recorded by the scribe was done at my direction and has been reviewed and validated by me.   Magda Bernheim, MD          Magda Bernheim, MD  Resident  01/20/22 [phone]

## 2022-01-19 NOTE — Unmapped (Signed)
Patient here with groin pain - patient concerned about blood clot. Patient on Eliquis - DVT in the left leg. Patient is on chemo

## 2022-01-20 MED ADMIN — HYDROmorphone (DILAUDID) injection 1 mg: 1 mg | INTRAVENOUS | @ 20:00:00 | Stop: 2022-01-20

## 2022-01-20 MED ADMIN — HYDROmorphone (PF) (DILAUDID) injection 0.5 mg: .5 mg | INTRAVENOUS | @ 01:00:00 | Stop: 2022-01-19

## 2022-01-20 MED ADMIN — sodium chloride (NS) 0.9 % infusion: 20 mL/h | INTRAVENOUS | @ 23:00:00

## 2022-01-20 MED ADMIN — ondansetron (ZOFRAN) injection 4 mg: 4 mg | INTRAVENOUS | @ 19:00:00 | Stop: 2022-01-20

## 2022-01-20 MED ADMIN — HYDROmorphone (DILAUDID) injection 1 mg: 1 mg | INTRAVENOUS | @ 13:00:00 | Stop: 2022-01-20

## 2022-01-20 MED ADMIN — HYDROmorphone (PF) (DILAUDID) injection 1 mg: 1 mg | INTRAVENOUS | @ 23:00:00 | Stop: 2022-02-03

## 2022-01-20 MED ADMIN — HYDROmorphone (DILAUDID) injection 1 mg: 1 mg | INTRAVENOUS | @ 09:00:00 | Stop: 2022-01-20

## 2022-01-20 MED ADMIN — HYDROmorphone (PF) (DILAUDID) injection 0.25 mg: .25 mg | INTRAVENOUS | @ 05:00:00 | Stop: 2022-01-20

## 2022-01-20 MED ADMIN — HYDROmorphone (DILAUDID) injection 1 mg: 1 mg | INTRAVENOUS | @ 17:00:00 | Stop: 2022-01-20

## 2022-01-20 MED ADMIN — hydroCHLOROthiazide (HYDRODIURIL) tablet 12.5 mg: 12.5 mg | ORAL | @ 19:00:00

## 2022-01-20 MED ADMIN — amLODIPine (NORVASC) tablet 10 mg: 10 mg | ORAL | @ 19:00:00

## 2022-01-20 MED ADMIN — HYDROmorphone (PF) (DILAUDID) injection 0.5 mg: .5 mg | INTRAVENOUS | @ 05:00:00 | Stop: 2022-01-20

## 2022-01-20 MED FILL — ICLUSIG 30 MG TABLET: ORAL | 30 days supply | Qty: 30 | Fill #1

## 2022-01-20 NOTE — Unmapped (Signed)
ED Progress Note    Received sign out from previous provider.    Patient Summary: Adam Keith is a 41 y.o. male with past medical history including ALL (being treated with ponatinib) and known left lower extremity DVT here with left-sided testicular and groin swelling.  Ultrasound imaging concerning for orchitis versus vascular congestion versus leukemic involvement.  Patient adamantly declining CT a or CT with contrast as he said that he was previously well that he Adam Keith cannot have contrast.  No documented allergy, we suspect that this is related to poor kidney function the past, which has improved.  Patient agreed to get a CT without contrast to at least get some information.  Action List:   Patient signed out to me pending CT, plan for admission for likely pain control and further evaluation    Updates  ED Course as of 01/20/22 0512   Thu Jan 20, 2022   0240 CT Abdomen Pelvis Wo Contrast  IMPRESSION:  No evidence of acute intra-abdominal/pelvic pathology.       Updated patient with CT results.  He is still requiring significant doses of it.  Pain medication.  Will admit for further evaluation.  Anticipate he will need to be evaluated by heme-onc, cleared for CT with contrast, and then further CT imaging to evaluate the cause of his pain.  He has no overlying skin changes, no concern for Fournier's, no concern for torsion given previous reassuring work-up of the standpoint and clearance by urology.  We will continue to dose of IV pain medication while awaiting an inpatient team

## 2022-01-20 NOTE — Unmapped (Cosign Needed)
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)    Hyperphosphatemia    MRSA bacteremia    Septic shock due to Staphylococcus aureus (CMS-HCC)    Hiccups    Red blood cell antibody positive    Hypokalemia    Hypomagnesemia    COVID-19    Dyspnea    Moderate episode of recurrent major depressive disorder (CMS-HCC)    PTSD (post-traumatic stress disorder)    Anxiety    Insomnia    Fever    Immunocompromised (CMS-HCC)    Hypogammaglobulinemia (CMS-HCC)    ALL (acute lymphoblastic leukemia) (CMS-HCC)    Respiratory failure with hypoxia (CMS-HCC)    Pneumonia    Chronic pain    Acute kidney injury superimposed on CKD (CMS-HCC)    COPD (chronic obstructive pulmonary disease) (CMS-HCC)    Acute lymphoblastic leukemia in remission (CMS-HCC)    Incarcerated umbilical hernia    Late effect of fracture of multiple bones    Low back pain    Migraine    Obesity    Pain in joint involving ankle and foot    Panic attacks    Tear of medial meniscus of knee    Acute respiratory failure with hypoxia (CMS-HCC)    Sepsis (CMS-HCC)    Influenza A    Bacterial lobar pneumonia    CKD (chronic kidney disease)    Pneumonia of right lung due to methicillin resistant Staphylococcus aureus (MRSA) (CMS-HCC)    Hypersensitivity    Cough    Bilateral lower extremity edema        Assessment/Plan: Adam Keith is an 41 y.o. male history complicated by Ph+ B-ALL, remote hx MRSA PNA + bacteremia, acute DVT LLE (on Eliquis), HTN who was admitted for L testicular pain .    L Testicular Pain  Patient with 1 day of acute L testicular pain. Scrotal US showing L testis with hyperemia and heterogeneity c/f orchitis vs vascular congestion vs leukemic involvement. Patient reports receiving MMR vaccine as a child, no history of viral illness.   -Urology c/s in ED, appreciate recs  -Pain: requiring Dilaudid pushes q3h, pended order for PCA if needed  -Fu GC/CT, urine culture  -Ice, elevation, scrotal support  -Consider Levaquin 500 mg PO daily x 10 day if c/f infection    Ph+ B-ALL  Follows with Dr. Senaida Ores. 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 Krusei), MRSA bacteremia w/ septic emboli c/b acute kidney failure and respiratory failure requiring CRRT and intubation. Treated with Dasatinib, difficulty tolerating due to edema. S/p C4 Ponatinib-Blinatumomab. Currently on Ponatinib monotherapy since 01/2021 with most recent BCR-ABL level of 0.002% in blood. Has decided against transplant. ANC of 6.3 on admission.  -Monitor daily CBC  -Hold Ponatinib  -Ppx:   -Valtrex   -Doxycyline for MRSA    L Popliteal DVT (Eliquis)  Patient noticed L leg swelling 1 month ago, PVL performed and found to have popliteal DVT. Started on Eliquis which he takes without issue.  -Continue Eliquis 5 mg BID    Hx of MRSA infections  Patient has a history of MRSA PNA requiring intubation.   -  Continue Doxycyline for ppx  -Consider ICID consult    HTN  Patient with hypertension chronically, SBP up to 160s and DBP up to 100s on admission. At current presentation elevated blood pressures are probably due to a mix of chronic hypertension and acute pain.  -Continue home Amlodipine 10 mg daily  -Continue home Hydrochlorothiazide 12.5 mg daily  -Hold home Coreg 12.5 mg BID     Cancer related Pain  Patient endorses pain associated with Leukemia. Pain worsens with no identified provoking factors. Pain has improved with treatment.  -Current Regimen: Gabapentin 600 mg AM 900 mg PM, Nortriptyline 100 mg nightly, Oxycodone 10 mg nightly.    Immunocompromised status  Patient is immunocompromised secondary to ALL disease or chemotherapy  -Antimicrobial prophylaxis as above     Impending Electrolyte Abnormality   Secondary to Chemotherapy and/or IV Fluids  -Daily Electrolyte monitoring  -Replete per [facility] guidelines.     Impending Pancytopenia secondary to Acute Leukemia/chemotherapy:   - Transfuse 1 unit of pRBCs for Hb >7  - Transfuse 1 unit plt for PLT >10K     HPI: Adam Keith is an 41 y.o. male history complicated by Ph+ B-ALL, remote hx MRSA PNA + bacteremia, acute DVT LLE (on Eliquis), HTN, p/w acute Left testicular pain c/f orchitis vs nutcracker vs other.    Patient with scrotal pain for past 1.5 days. Patient reports being in usual state of health except for bilateral hip pain that has been occurring for the past week. 2 nights ago he was awaken from sleep by acute scrotal pain. This pain was somewhat relieved by his chronic pain medications but not adequately controlled. He has not had any pain with urination and denies having any sexual partners outside of his wife of many years. He presented to the ED for management of his pain.    In the ED, his VS were found to be stable, he was started on pain medication, and seen by urology. Lab workup including CBC and BMP fairly unremarkable. A scrotal US was performed showing a heterogenous and hyperemic L testicle that is concerning for vascular congestion vs orchitis vs leukemic involvement. A CT AP without contrast was also performed but was unremarkable for any acute pathology.     Of note, patient had unilateral leg swelling 1 month ago and presented for evaluation, noted to have DVT in the L popliteal vein for which he was started on Eliquis 5 mg BID, which he has been taking without issue.     Review of Systems: All positive and pertinent negatives are noted in the HPI; otherwise all other systems are negative    Oncologic History:   Primary Oncologist: Senaida Ores  Oncology History Overview Note   Referring/Local Oncologist: None    Diagnosis:Ph+ ALL    Genetics:    Karyotype/FISH:Abnormal Karyotype: 46,XY,t(9;22)(q34;q11.2)[1]/45,XY,der(7;9)(q10;q10)t(9;22)(q34;q11.2),der(22)t(9;22)[11]/46,sdl,+der(22)t(9;22)[5]/46,XY[3]     Abnormal FISH: A BCR/ABL1 interphase FISH assay shows an abnormal signal pattern in 97% of the 100 cells scored. 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  The findings support a diagnosis of leukemia and have implications for targeted therapy and for monitoring residual disease.      Molecular Genetics:  BCR-ABL1 p210 transcripts were detected at a level of 46.479 IS% ratio in bone marrow.  BCR-ABL1 p190 transcripts were detected at a level of 4 in 100,000 cells in bone marrow.  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)     01/24/2020 - 04/02/2020 Chemotherapy    IP/OP LEUKEMIA GRAAPH-2005 + RITUXIMAB < 60 YO  rituximab hypercvad (odd and even course)     02/24/2020 Remission    CR with PCR MRD+; marrow showing 70% blasts with <1% blasts, negative flow MRD, BCR-ABL p210 PCR 0.197%; CNS negative for blasts     03/09/2020 Progression    CSF - rare blast ID'ed - IT chemo given    03/13/20 - IT chemo, CSF negative     04/16/2020 Biopsy    BM Bx with 40-50% cellularity, <1% blasts, MRD flow cytometry negative, BCR-ABL p210 transcripts 0.036%     05/28/2020 - 05/28/2020 Chemotherapy    OP AML - CNS THERAPY (INTRATHECAL CYTARABINE, INTRATHECAL METHOTREXATE, OR INTRATHECAL TRIPLE)  Select one of the following: cytarabine IT 100 mg with hydrocortisone 50 mg, cytarabine IT 40 mg with methotrexate 15 mg with hydrocortisone 50 mg, OR methotrexate IT 12 mg with hydrocortisone 50 mg     06/19/2020 -  Chemotherapy    IP/OP LEUKEMIA BLINATUMOMAB 7-DAY INFUSION (MINIMAL RESIDUAL DISEASE; WT >= 22 KG) (HOME INFUSION)      Cycles 1*-4: Blinatumomab 28 mcg/day Days 1-28 of 6-week cycle.  *Given in the inpatient setting on Days 1-3 on Cycle 1 and Days 1-2 on Cycle 2, while other treatment days are given in the outpatient setting.    Cycle 1 MRD Dosing     07/18/2020 Adverse Reaction    Hospitalization: fevers. Pneumonia     07/23/2020 Biopsy    Diagnosis  Bone marrow, right iliac, aspiration and biopsy  -   Normocellular bone marrow (50%) with trilineage hematopoiesis and 1% blasts by manual aspirate differential  -   Flow cytometry MRD analysis reveals no definitive immunophenotypic evidence of residual B lymphoblastic leukemia   - BCR-ABL p210 0.006%         08/10/2020 -  Chemotherapy    Cycle 2 blinatumomab-ponatinib  Ponatinib 30mg     1 IT per cycle     09/21/2020 Adverse Reaction    Covid-19, symptomatic but not requiring hospitalization.     10/06/2020 -  Chemotherapy    Cycle 3 blinatumomab-ponatinib  Ponatinib 30mg        11/01/2020 Adverse Reaction    Hospitalization: MRSA Pneumonia requiring intubation    Blinatumomab stopped.  Ponatinib held until he stabilized.       12/14/2020 -  Chemotherapy    Cycle 4 blinatumomab 28 mcg/day days 1-28, ponatinib 30 mg per day IT triple therapy day 29     01/13/2021 -  Chemotherapy    Ponatinib 30mg  daily  - due to due low level BCR-ABL     03/06/2021 Adverse Reaction    Hospitalization - ICU on ventilator - influenza and MRSA/klebsiella PNA     03/10/2022 -  Chemotherapy    OP LEUKEMIA VINCRISTINE  vinCRIStine 2 mg IV on day 1     ALL (acute lymphoblastic leukemia) (CMS-HCC)   06/11/2020 -  Chemotherapy    IP/OP LEUKEMIA BLINATUMOMAB 7-DAY INFUSION (MINIMAL RESIDUAL DISEASE; WT >= 22 KG) (HOME INFUSION)  Cycles 1*-4: Blinatumomab 28 mcg/day Days 1-28 of 6-week cycle.  *Given in the inpatient setting on Days 1-3 on Cycle 1 and Days 1-2 on  Cycle 2, while other treatment days are given in the outpatient setting. 09/21/2020 Initial Diagnosis    ALL (acute lymphoblastic leukemia) (CMS-HCC)     03/10/2022 -  Chemotherapy    OP LEUKEMIA VINCRISTINE  vinCRIStine 2 mg IV on day 1     Acute lymphoblastic leukemia in remission (CMS-HCC)   06/11/2020 -  Chemotherapy    IP/OP LEUKEMIA BLINATUMOMAB 7-DAY INFUSION (MINIMAL RESIDUAL DISEASE; WT >= 22 KG) (HOME INFUSION)  Cycles 1*-4: Blinatumomab 28 mcg/day Days 1-28 of 6-week cycle.  *Given in the inpatient setting on Days 1-3 on Cycle 1 and Days 1-2 on Cycle 2, while other treatment days are given in the outpatient setting.     11/09/2020 Initial Diagnosis    Acute lymphoblastic leukemia in remission (CMS-HCC)     03/10/2022 -  Chemotherapy    OP LEUKEMIA VINCRISTINE  vinCRIStine 2 mg IV on day 1         Medical History:  PCP: Dierdre Harness, MD  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    Surgical History:  Past Surgical History:   Procedure Laterality Date    BONE MARROW BIOPSY & ASPIRATION  01/21/2020         CHG [place], CHEST,REAL TIME  11/20/2020    Procedure: ULTRASOUND, CHEST, REAL TIME WITH IMAGE DOCUMENTATION;  Surgeon: Jerelyn Charles, MD;  Location: [facility];  Service: Pulmonary    IR INSERT PORT AGE GREATER THAN 5 YRS  03/10/2020    IR INSERT PORT AGE GREATER THAN 5 YRS 03/10/2020 [name], MD IMG VIR [facility]    PR BRONCHOSCOPY,DIAGNOSTIC W LAVAGE Bilateral 07/20/2020    Procedure: BRONCHOSCOPY, RIGID OR FLEXIBLE, INCLUDE FLUOROSCOPIC GUIDANCE WHEN PERFORMED; W/BRONCHIAL ALVEOLAR LAVAGE WITH MODERATE SEDATION;  Surgeon: Dellis Filbert, MD;  Location: [facility];  Service: Pulmonary      Social History:  Social History     Socioeconomic History    Marital status: Married   Tobacco Use    Smoking status: Every Day     Packs/day: 2     Types: Cigarettes     Passive exposure: Current    Smokeless tobacco: Former     Types: Catering manager Use: Never used Substance and Sexual Activity    Alcohol use: Not Currently    Drug use: Never   Other Topics Concern    Do you use sunscreen? No    Tanning bed use? No    Are you easily burned? Yes    Excessive sun exposure? No    Blistering sunburns? Yes     Social Determinants of Health     Financial Resource Strain: Low Risk  (03/08/2021)    Overall Financial Resource Strain (CARDIA)     Difficulty of Paying Living Expenses: Not very hard   Food Insecurity: No Food Insecurity (03/08/2021)    Hunger Vital Sign     Worried About Running Out of Food in the Last Year: Never true     Ran Out of Food in the Last Year: Never true   Transportation Needs: No Transportation Needs (03/08/2021)    PRAPARE - Therapist, art (Medical): No     Lack of Transportation (Non-Medical): No      Family History:   family history includes Cancer in his maternal grandfather and mother; No  Known Problems in his brother, father, maternal aunt, maternal grandmother, maternal uncle, paternal aunt, paternal grandfather, paternal grandmother, paternal uncle, and sister.     Allergies: is allergic to bupropion hcl, cefepime, ceftaroline fosamil, dapsone, onion, vancomycin analogues, bismuth subsalicylate, privigen [immun glob g(igg)-pro-iga 0-50], furosemide, and gammagard.    Medications:   Meds:   amLODIPine  10 mg Oral Daily    hydroCHLOROthiazide  12.5 mg Oral Daily     Continuous Infusions:  PRN Meds:.HYDROmorphone, nicotine    Objective:   Vitals: Temp:  [36.5 ??C (97.7 ??F)-36.6 ??C (97.9 ??F)] 36.5 ??C (97.7 ??F)  Heart Rate:  [88-96] 88  SpO2 Pulse:  [78-92] 91  Resp:  [18-22] 18  BP: (133-162)/(72-99) 162/84  MAP (mmHg):  [101] 101  SpO2:  [95 %-99 %] 96 %    Physical Exam:  General: Resting, uncomfortable due to pain, lying in bed  HEENT:  PERRL. No scleral icterus or conjunctival injection. MMM without ulceration, erythema or exudate. No cervical or axillary lymphadenopathy.   Heart: RRR. S1, S2. No murmurs, gallops, or rubs.  Lungs: Breathing is unlabored, and patient is speaking full sentences with ease. No stridor. CTAB. No rales, ronchi, or crackles.    Abdomen: No distention or pain on palpation. Bowel sounds are present and normoactive x 4. No palpable hepatomegaly or splenomegaly. No palpable masses.  Skin: No rashes, petechiae or purpura. No areas of skin breakdown. Warm to touch, dry, smooth, and even.  Musculoskeletal: No grossly-evident joint effusions or deformities. Range of motion about the shoulder, elbow, hips and knees is grossly normal.  Psychiatric: Range of affect is appropriate.    Neurologic: Alert and oriented to person, place, time and situation. Gait is normal.  No Nystagmus Cerebellar tasks (finger-to-nose, rapid hand movement, heel along shin) are completed with ease and are symmetric. CNII-CNXII grossly intact.  Extremities: Appear well-perfused. No clubbing, edema, or cyanosis.  CVAD: R CW Port - no erythema, nontender; dressing CDI.  Genitourinary: mild scrotal edema with erythema, intensely tender to light palpation over the L testis      Test Results  Recent Labs     01/19/22  1701   WBC 9.2   NEUTROABS 6.3   HGB 17.0*   PLT 187     Recent Labs     01/19/22  1701   NA 139   K 4.9*   CL 107   CO2 25.0   BUN 18   CREATININE 1.67*   CALCIUM 9.0       Imaging:  Scrotal US showing L testis with hyperemia. CT AP wo with no acute intra-abdominal/pelvic pathology.    DVT PPX Indicated: no,  on therapeutic AC  FEN:  Discharge Plan:  - fluids: no  - electrolytes: stable  - diet: regular     Need for PT: no  Anticipated Discharge: Their home    Code Status: @PATFPLSTATCODE @   Full Code      I personally spent 60 minutes face-to-face and non-face-to-face in the care of this patient, which includes all pre, intra, and post visit time on the date of service.  All documented time was specific to the E/M visit and does not include any procedures that may have been performed.

## 2022-01-20 NOTE — Unmapped (Signed)
UROLOGY CONSULT NOTE    Requesting Attending Physician:  Cherrie Gauze, MD  Service Requesting Consult:  Emergency Medicine  Service Providing Consult: SRU  Consulting Attending: Dr. Hoyle Barr    Assessment:  Patient is a 41 y.o. 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 Urology consulted for ultrasound with consideration of orchitis vs. Vascular congestion vs. Leukemic involvement vs. Referred pain.     Upon review of ultrasound, it appears that there is good bilateral flow to both testicles.  Specifically, the left testicle is heterogeneous and hyperemic, with the final read stating could be secondary to edema versus vascular congestion, orchitis, or leukemic involvement.    At this time, patient does not need urgent urologic surgical intervention, as there is no concern for testicular torsion.  It is much more likely that the patient has orchitis or vascular congestion.  Although the patient is not sexually active, men greater than 70 years of age often have a propensity to develop E. coli if there patient has any obstructive voiding pathologies.  In regards to concern for possible leukemic involvement, would ultimately defer to medical oncology.  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    Upon examination, did appear that epididymous and spermatic cord did feel more edematous compared to a normal baseline exam, with the left spermatic cord greater than right. Possible left varicocele. In setting of prior DVT, may benefit from further abdominal imaging to r/o renal vein thrombosis or any other cause of deferred testicular pain, such as kidney stones.     Recommendations:  No acute urologic intervention required at this time  Would recommend trial of anti-inflammatories if safe from medical perspective in addition to conservative therapies, including ice, elevation and good scrotal support (such as jock strap)  Would obtain dedicated Urine Culture, GC/CT, other infectious work-up for consideration of orchitis  Would trial additional abx therapy in setting of possible orchitis  At this time, although unable to rule out leukemic involvement, do not believe this is highest on the differential - could consider additional work-up should symptoms not resolve   Would defer to med onc team re: any further work-up re: leukemic relapse if high concern  Would consider additional imaging to r/o other etiologies such as referred pain (such as kidney stones) or abdominal pathology    Dr. Woodfin Ganja and Dr. Vernia Buff Bjurlin available.  Thank you for this consult. Please page [phone] with any questions or concerns.    History of Present Illness:   Adam Keith is seen in consultation for left scrotal pain at the request of Cherrie Gauze, MD on the Emergency Medicine.     Patient is a pleasant 41 year old man with past medical history of B-AL L, originally diagnosed in 2021.  He is status post induction chemotherapy, and course has been complicated by multiple infections, including fungemia, MRSA bacteremia with septic emboli and acute renal failure, respiratory distress and intubation.  He is currently on oral mono therapy.    Patient reports that he woke up yesterday and exquisite left testicular pain.  He states that this has not happened before, and rates his pain a 10 out of 10.  He states that he is not sexually active, and has never  had any urinary tract infections before.  He denies any known urinary obstructive voiding behaviors.    In the emergency department, normotensive, with heart rate in the 90s.  He has no leukocytosis, and his creatinine is at 1.67 (baseline appears to be between 1.6-2 over the past few months.)    Urine analysis with culture is currently in process.    Of note, patient was recently diagnosed with a DVT and is currently on Eliquis.    He does have remote history of kidney stones.     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       Past Surgical History:   Past Surgical History:   Procedure Laterality Date    BONE MARROW BIOPSY & ASPIRATION  01/21/2020         CHG [place], CHEST,REAL TIME  11/20/2020    Procedure: ULTRASOUND, CHEST, REAL TIME WITH IMAGE DOCUMENTATION;  Surgeon: Jerelyn Charles, MD;  Location: [facility];  Service: Pulmonary    IR INSERT PORT AGE GREATER THAN 5 YRS  03/10/2020    IR INSERT PORT AGE GREATER THAN 5 YRS 03/10/2020 [name], MD IMG [facility]    PR BRONCHOSCOPY,DIAGNOSTIC W LAVAGE Bilateral 07/20/2020    Procedure: BRONCHOSCOPY, RIGID OR FLEXIBLE, INCLUDE FLUOROSCOPIC GUIDANCE WHEN PERFORMED; W/BRONCHIAL ALVEOLAR LAVAGE WITH MODERATE SEDATION;  Surgeon: Dellis Filbert, MD;  Location: [facility];  Service: Pulmonary       Medication:  No current facility-administered medications for this encounter.     Current Outpatient Medications   Medication Sig Dispense Refill    amLODIPine (NORVASC) 10 MG tablet Take 1 tablet (10 mg total) by mouth daily. 30 tablet 0    apixaban (ELIQUIS DVT-PE TREAT 30D START) 5 mg (74 tabs) DsPk Take 2 tablets (10 mg total) by mouth twice daily for 7 days, then take 1 tablet (5 mg) by mouth twice daily 70 tablet 1    arm brace (WRIST BRACE) Misc 1 Piece by Miscellaneous route as needed. left ulnar wrist brace 1 each 0    aspirin 81 MG chewable tablet Chew 1 tablet (81 mg total) daily. 36 tablet 11    buprenorphine 5 mcg/hour PTWK transdermal patch Place 1 patch on the skin every seven (7) days for 28 days. 4 patch 0    carvediloL (COREG) 12.5 MG tablet Take 1 tablet (12.5 mg total) by mouth Two (2) times a day. 180 tablet 1    docusate sodium (COLACE) 100 MG capsule Take 1 capsule (100 mg total) by mouth daily.      doxycycline (VIBRA-TABS) 100 MG tablet Take 1 tablet (100 mg total) by mouth Two (2) times a day. 180 tablet 3    gabapentin (NEURONTIN) 300 MG capsule Take 2 capsules (600mg ) by mouth in the morning and 3 capsules (900mg ) at bedtime. May cause drowsiness. (Patient taking differently: 2 capsules (600 mg total) two (2) times a day. Take 1 capsules (600mg ) by mouth in the morning and 2 capsules (600mg ) at bedtime. May cause drowsiness.) 150 capsule 5    hydroCHLOROthiazide (HYDRODIURIL) 12.5 MG tablet Take 1 tablet (12.5 mg total) by mouth daily. 30 tablet 11    mirtazapine (REMERON) 15 MG tablet TAKE 1 TABLET BY MOUTH EVERY DAY AT NIGHT 90 tablet 4    mucus clearing device Devi 1 each by Miscellaneous route Two (2) times a  day. 1 each 0    nortriptyline (PAMELOR) 50 MG capsule Take 2 capsules (100 mg total) by mouth nightly.      omeprazole (PRILOSEC) 20 MG capsule Take 1 capsule (20 mg total) by mouth daily.      oxyCODONE (OXYCONTIN) 10 mg TR12 12 hr crush resistant ER/CR tablet Take 1 tablet (10 mg total) by mouth nightly for 28 days. 28 tablet 0    PONATinib (ICLUSIG) 30 mg tablet Take 1 tablet (30 mg total) by mouth daily. Swallow tablets whole. Do not crush, break, cut or chew tablets. 30 tablet 5    spironolactone (ALDACTONE) 25 MG tablet Take 1 tablet (25 mg total) by mouth daily. 90 tablet 3    sulfamethoxazole-trimethoprim (BACTRIM DS) 800-160 mg per tablet TAKE 1 TABLET BY MOUTH 2 TIMES A DAY ON SATURDAY, SUNDAY. FOR PROPHYLAXIS WHILE ON CHEMO. 48 tablet 3    umeclidinium-vilanteroL (ANORO ELLIPTA) 62.5-25 mcg/actuation inhaler Inhale 1 puff daily. 60 each 11    valACYclovir (VALTREX) 500 MG tablet Take 1 tablet (500 mg total) by mouth daily. 90 tablet 11       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    Onion Anaphylaxis    Vancomycin Analogues Other (See Comments)     Hearing loss with Lasix    Other reaction(s): Other (See Comments)    Hearing loss    lasix    Hearing loss      lasix      Hearing loss with Lasix Other reaction(s): Other (See Comments) Hearing loss lasix    Bismuth Subsalicylate Nausea And Vomiting    Privigen [Immun Glob G(Igg)-Pro-Iga 0-50] Other (See Comments)     12 /10/2020 VIG infusion stopped as patient developed rigors, tachycardia and HTN during infusion despite pre-medication; 03/12/21 completed IVIG with premedications and slower rate of infusion    Furosemide Other (See Comments)     With Vancomycin caused hearing loss    Other reaction(s): Other (See Comments)    With Vancomycin caused hearing loss      With Vancomycin caused hearing loss Other reaction(s): Other (See Comments) With Vancomycin caused hearing loss    Gammagard        Social History:  Social History     Tobacco Use    Smoking status: Every Day     Packs/day: 2     Types: Cigarettes     Passive exposure: Current    Smokeless tobacco: Former     Types: Catering manager Use: Never used   Substance Use Topics    Alcohol use: Not Currently    Drug use: Never       Family History:  Family History   Problem Relation Age of Onset    Cancer Mother     No Known Problems Father     No Known Problems Sister     No Known Problems Brother     No Known Problems Maternal Aunt     No Known Problems Maternal Uncle     No Known Problems Paternal Aunt  No Known Problems Paternal Uncle     No Known Problems Maternal Grandmother     Cancer Maternal Grandfather     No Known Problems Paternal Grandmother     No Known Problems Paternal Grandfather     Melanoma Neg Hx     Basal cell carcinoma Neg Hx     Squamous cell carcinoma Neg Hx        Review of Systems:  10 systems were reviewed and are negative except as noted specifically in the HPI.    Objective:     Intake/Output last 3 shifts:  No intake/output data recorded.  Vital signs in last 24 hours:  BP 140/99  - Pulse 93  - Temp 36.6 ??C (97.9 ??F) (Oral)  - Resp 22  - Wt (!) 129.3 kg (285 lb)  - SpO2 98%  - BMI 36.59 kg/m??     Physical Exam:  General:  Resting on exam room, moderately uncomfortable  HEENT: Normocephalic, atraumatic, pupils equal and round, sclera anicteric  Neck  Trachea midline, symmetrical  Lungs:   Coarse breath sounds bilaterally  Cardiac: Regular rate  Abdomen: Mildly tender, soft, non distended.  GU:  No CVA tenderness. Penis normal. Bilateral testicles mildly firm, not swollen. Left epididymal cyst appreciated, left spermatic cord appeared edematous. No testicular erythema.   Extremities: Warm and well perfused  Neuro:             Alert and oriented, strength and sensation grossly normal      Most Recent Labs:  Recent Labs   Lab Units 01/19/22  1701   WBC 10*9/L 9.2   RBC 10*12/L 5.65*   HEMOGLOBIN g/dL 16.1*   HEMATOCRIT % 09.6*   MCV fL 85.4   MCH pg 30.1   MCHC g/dL 04.5   RDW % 40.9*   PLATELET COUNT (1) 10*9/L 187   MPV fL 8.6     Recent Labs   Lab Units 01/19/22  1701   SODIUM mmol/L 139   POTASSIUM mmol/L 4.9*   CHLORIDE mmol/L 107   CO2 mmol/L 25.0   BUN mg/dL 18   CREATININE mg/dL 8.11*   GLUCOSE mg/dL 914     Recent Labs   Lab Units 01/19/22  1701   ALT U/L 48   AST U/L 25   ALK PHOS U/L 98   ALBUMIN g/dL 4.4   PROTEIN TOTAL g/dL 6.7   BILIRUBIN TOTAL mg/dL 0.4     Recent Labs   Lab Units 01/19/22  1701   INR  0.94   APTT sec 35.8       Microbiology Data:  Blood Culture, Routine   Date Value Ref Range Status   10/19/2021 No Growth at 5 days  Final   10/19/2021 No Growth at 5 days  Final   03/13/2021 No Growth at 5 days  Final   03/13/2021 No Growth at 5 days  Final   03/09/2021 No Growth at 5 days  Final   03/09/2021 No Growth at 5 days  Final   03/06/2021 No Growth at 5 days  Final 03/06/2021 No Growth at 5 days  Final     Urine Culture, Comprehensive   Date Value Ref Range Status   03/06/2021 NO GROWTH  Final   11/01/2020 NO GROWTH  Final   04/13/2020 Mixed Urogenital Flora  Final   02/04/2020 NO GROWTH  Final     Aerobic/Anaerobic Culture   Date Value Ref Range Status   02/26/2020 NO GROWTH  Final     Most recent Urinalysis:  No results in the last week   Urinalysis History:  Leukocyte Esterase, UA   Date Value Ref Range Status   10/19/2021 Negative Negative Final   03/14/2021 Negative Negative Final   03/06/2021 Small (A) Negative Final   07/18/2020 Negative Negative Final   05/31/2020 Negative Negative Final   04/29/2020 Negative Negative Final   04/28/2020 Negative Negative Final     Nitrite, UA   Date Value Ref Range Status   10/19/2021 Negative Negative Final   03/14/2021 Negative Negative Final   03/06/2021 Negative Negative Final   07/18/2020 Negative Negative Final   05/31/2020 Negative Negative Final   04/29/2020 Negative Negative Final   04/28/2020 Negative Negative Final     RBC, UA   Date Value Ref Range Status   10/19/2021 <1 <=3 /HPF Final   03/14/2021 2 <=3 /HPF Final   03/06/2021 1 <=3 /HPF Final   07/18/2020 1 <=3 /HPF Final   05/31/2020 <1 <=3 /HPF Final   04/29/2020 1 <=3 /HPF Final   04/28/2020 <1 <=3 /HPF Final     WBC, UA   Date Value Ref Range Status   10/19/2021 1 <=2 /HPF Final   03/14/2021 4 (H) <=2 /HPF Final   03/06/2021 17 (H) <=2 /HPF Final   07/18/2020 1 <=2 /HPF Final   05/31/2020 1 <=2 /HPF Final   04/29/2020 4 (H) <=2 /HPF Final   04/28/2020 1 <=2 /HPF Final     Squam Epithel, UA   Date Value Ref Range Status   10/19/2021 <1 0 - 5 /HPF Final   03/14/2021 <1 0 - 5 /HPF Final   03/06/2021 <1 0 - 5 /HPF Final   07/18/2020 1 0 - 5 /HPF Final   05/31/2020 <1 0 - 5 /HPF Final   04/29/2020 <1 0 - 5 /HPF Final   04/28/2020 <1 0 - 5 /HPF Final     Bacteria, UA   Date Value Ref Range Status   10/19/2021 None Seen None Seen /HPF Final   03/14/2021 None Seen None Seen /HPF Final   03/06/2021 Rare (A) None Seen /HPF Final   07/18/2020 None Seen None Seen /HPF Final   05/31/2020 None Seen None Seen /HPF Final   04/29/2020 Few (A) None Seen /HPF Final   04/28/2020 None Seen None Seen /HPF Final        Imaging:  US Scrotum and Testes    Result Date: 01/19/2022  EXAM: US SCROTUM DATE: 01/19/2022 ACCESSION: [id number] UN DICTATED: 01/19/2022 6:20 PM INTERPRETATION LOCATION: [facility]     CLINICAL INDICATION: 41 years old Male with acute left sided testicular pain     COMPARISON: None     TECHNIQUE:  Ultrasound views of the scrotum were obtained using gray scale and color Doppler imaging. Spectral Doppler imaging was also performed.     FINDINGS:     TESTICLES: The right testis is mildly heterogenous in echotexture. The left testis is moderately heterogeneous in echotexture with scattered areas of low echogenicity relative to the rest of the parenchyma. No discrete masses are seen appropriate arterial inflow and venous outflow of the testicles was documented with color and spectral Doppler imaging. Left testes appears mildly hyperemic compared to the right.      Right testicle: Sagittal 4.7 cm; AP 3.0 cm; Transverse 3.4 cm      Left testicle: Sagittal 4.6 cm; AP 2.8 cm; Transverse 3.7 cm     EPIDIDYMIS:  Appropriate flow was seen in the epididymal heads.      Right epididymis head: 1.1 cm      Left epididymis head: 0.70 cm     OTHER: Small left fat-containing inguinal hernia. No hydroceles noted.             Left testicle is heterogenous and hyperemic. Differential is edema from vascular congestion versus orchitis or leukemic involvement. Normal arterial and venous waveforms are present.     No abnormalities of the right testis.

## 2022-01-20 NOTE — Unmapped (Signed)
ED Progress Note    We are concerned about extension of blood clot causing venous congestion.  Patient however declines CT with contrast despite multiple reassurances.  Will do CT without contrast to look for other abdominal etiologies of discomfort but patien understands we can not evaluate for blood clot.

## 2022-01-20 NOTE — Unmapped (Signed)
Received sign out from previous provider.    Patient Summary: Adam Keith is a 41 y.o. male  a 41 y.o. male with past medical history including ALL (being treated with ponatinib) and known left lower extremity DVT here with left-sided testicular and groin swelling. Left testicle is heterogenous and hyperemic. Differential is edema from vascular congestion versus orchitis or leukemic involvement. Normal arterial and venous waveforms are present.  CT abdomen pelvis without contrast unrevealing.  Action List:   Pending admission for pain control and potential intervention in setting of vascular intervention for left testicle.    Updates  ED Course as of 01/20/22 1902   Thu Jan 20, 2022   1724 Discussed plan of care with admitting team, patient to be admitted.

## 2022-01-20 NOTE — Unmapped (Addendum)
Outpatient Follow-Up Considerations:  [ ]  Testicular Pain - Follow up resolution of pain vs Infectious studies (NGTD at time of discharge)    [ ]  Mumps - IgG level pending at time of discharge     Hospital Course:    Adam Keith is a 41 y.o. male with PMH Ph+ B-ALL, remote MRSA PNA and Bacteremia, acute DVT of LLE on AC, and HTN, who was admitted for L testicular pain.    L. Testicular Pain  Presented with acute L testicular pain for 1.5 days that woke him from sleep; no pain with urination, no new sexual partners. Scrotal U/S in ED showed heterogenous and hyperemic L Testicle concerning for vascular congestion versus orchitis versus leukemic involvement. CT A/P with and without contrast were negative for acute abdominal/pelvic pathology. U/A without bacteria and C/G test negative: urine culture from admission with < 10K bacteria. He remained afebrile with mild improvement of pain, managed with pain medications similar to home regiment. He was discharged with plan for Urology follow up as outpatient and close return precautions if symptoms worsen.    Ph+ B-ALL  Follows with Dr. Senaida Ores. 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 Krusei), MRSA bacteremia w/ septic emboli c/b acute kidney failure and respiratory failure requiring CRRT and intubation. Treated with Dasatinib, difficulty tolerating due to edema. S/p C4 Ponatinib-Blinatumomab. Currently on Ponatinib monotherapy since 01/2021 with most recent BCR-ABL level of 0.002% in blood. Has decided against transplant. ANC of 6.3 on admission. Continued his home Pontatinib and prophylactic medications (Valtrex, Doxycycline, Bactrim).    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

## 2022-01-21 LAB — BASIC METABOLIC PANEL
ANION GAP: 8 mmol/L (ref 5–14)
BLOOD UREA NITROGEN: 19 mg/dL (ref 9–23)
BUN / CREAT RATIO: 12
CALCIUM: 9 mg/dL (ref 8.7–10.4)
CHLORIDE: 102 mmol/L (ref 98–107)
CO2: 24 mmol/L (ref 20.0–31.0)
CREATININE: 1.55 mg/dL — ABNORMAL HIGH
EGFR CKD-EPI (2021) MALE: 57 mL/min/{1.73_m2} — ABNORMAL LOW (ref >=60)
GLUCOSE RANDOM: 119 mg/dL (ref 70–179)
POTASSIUM: 4 mmol/L (ref 3.4–4.8)
SODIUM: 134 mmol/L — ABNORMAL LOW (ref 135–145)

## 2022-01-21 LAB — PHOSPHORUS: PHOSPHORUS: 4.1 mg/dL (ref 2.4–5.1)

## 2022-01-21 LAB — MAGNESIUM: MAGNESIUM: 1.8 mg/dL (ref 1.6–2.6)

## 2022-01-21 LAB — HEPATIC FUNCTION PANEL
ALBUMIN: 4.3 g/dL (ref 3.4–5.0)
ALKALINE PHOSPHATASE: 89 U/L (ref 46–116)
ALT (SGPT): 32 U/L (ref 10–49)
AST (SGOT): 18 U/L (ref <=34)
BILIRUBIN DIRECT: 0.2 mg/dL (ref 0.00–0.30)
BILIRUBIN TOTAL: 0.7 mg/dL (ref 0.3–1.2)
PROTEIN TOTAL: 6.8 g/dL (ref 5.7–8.2)

## 2022-01-21 MED ADMIN — HYDROmorphone (PF) (DILAUDID) injection 1 mg: 1 mg | INTRAVENOUS | @ 04:00:00 | Stop: 2022-01-21

## 2022-01-21 MED ADMIN — gabapentin (NEURONTIN) capsule 900 mg: 900 mg | ORAL | @ 03:00:00

## 2022-01-21 MED ADMIN — apixaban (ELIQUIS) tablet 5 mg: 5 mg | ORAL | @ 13:00:00

## 2022-01-21 MED ADMIN — gabapentin (NEURONTIN) capsule 600 mg: 600 mg | ORAL | @ 13:00:00

## 2022-01-21 MED ADMIN — mirtazapine (REMERON) tablet 15 mg: 15 mg | ORAL | @ 03:00:00

## 2022-01-21 MED ADMIN — oxyCODONE (ROXICODONE) immediate release tablet 10 mg: 10 mg | ORAL | @ 23:00:00 | Stop: 2022-02-04

## 2022-01-21 MED ADMIN — valACYclovir (VALTREX) tablet 500 mg: 500 mg | ORAL | @ 03:00:00

## 2022-01-21 MED ADMIN — oxyCODONE (ROXICODONE) immediate release tablet 10 mg: 10 mg | ORAL | @ 01:00:00 | Stop: 2022-02-03

## 2022-01-21 MED ADMIN — HYDROmorphone (PF) (DILAUDID) injection 1 mg: 1 mg | INTRAVENOUS | @ 02:00:00 | Stop: 2022-02-03

## 2022-01-21 MED ADMIN — iohexoL (OMNIPAQUE) 350 mg iodine/mL solution 100 mL: 100 mL | INTRAVENOUS | @ 16:00:00 | Stop: 2022-01-21

## 2022-01-21 MED ADMIN — amLODIPine (NORVASC) tablet 10 mg: 10 mg | ORAL | @ 13:00:00

## 2022-01-21 MED ADMIN — doxycycline (VIBRA-TABS) tablet 100 mg: 100 mg | ORAL | @ 03:00:00 | Stop: 2022-02-19

## 2022-01-21 MED ADMIN — hydroCHLOROthiazide (HYDRODIURIL) tablet 12.5 mg: 12.5 mg | ORAL | @ 14:00:00

## 2022-01-21 MED ADMIN — doxycycline (VIBRA-TABS) tablet 100 mg: 100 mg | ORAL | @ 13:00:00 | Stop: 2022-02-19

## 2022-01-21 MED ADMIN — HYDROmorphone (DILAUDID) 50mg/50ml (1mg/ml) PCA CADD: INTRAVENOUS | @ 05:00:00 | Stop: 2022-01-21

## 2022-01-21 MED ADMIN — valACYclovir (VALTREX) tablet 500 mg: 500 mg | ORAL | @ 14:00:00

## 2022-01-21 MED ADMIN — apixaban (ELIQUIS) tablet 5 mg: 5 mg | ORAL | @ 03:00:00

## 2022-01-21 MED ADMIN — nortriptyline (PAMELOR) capsule 100 mg: 100 mg | ORAL | @ 03:00:00

## 2022-01-21 NOTE — Unmapped (Signed)
Problem: Adult Inpatient Plan of Care  Goal: Plan of Care Review  Outcome: Progressing  Goal: Patient-Specific Goal (Individualized)  Outcome: Progressing  Goal: Absence of Hospital-Acquired Illness or Injury  Outcome: Progressing  Goal: Optimal Comfort and Wellbeing  Outcome: Progressing  Goal: Readiness for Transition of Care  Outcome: Progressing  Goal: Rounds/Family Conference  Outcome: Progressing     Problem: COPD (Chronic Obstructive Pulmonary Disease) Comorbidity  Goal: Maintenance of COPD Symptom Control  Outcome: Progressing     Problem: Pain Chronic (Persistent) (Comorbidity Management)  Goal: Acceptable Pain Control and Functional Ability  Outcome: Progressing     Problem: Infection  Goal: Absence of Infection Signs and Symptoms  Outcome: Progressing     Problem: Impaired Wound Healing  Goal: Optimal Wound Healing  Outcome: Progressing

## 2022-01-21 NOTE — Unmapped (Signed)
Inpatient Tobacco Cessation Counseling Note    This medical encounter was conducted virtually using [email or web address]  TeleHealth protocols.    I have identified myself to the patient and conveyed my credentials to Adam Keith  I have explained the capabilities and limitations of telemedicine and the patient/proxy and myself both agree that it is appropriate for their current circumstances/symptoms.     Contact Information  Person Contacted: Adam Keith         Contact Phone number: [phone]      Phone Outcome: Spoke with pt  Is there someone else in the room? No.     Patient's location at the time of the telephone visit: Hospitalized at [facility]   Provider's location at the time of the telephone visit: At home, in [place]      Purpose of contact:     Pt participated in a telephone visit for tobacco cessation counseling.  Patient was admitted to hospital for Left testicular pain [N50.812]. Patient consented to telephone visit given due to social isolation measures in place due to the COVID-19 pandemic.     Tobacco Use History and Assessment  Time Since Last Tobacco Use: 1 to 7 days ago  Tobacco Withdrawal (Past 24 Hours): Desire or craving to smoke  Type of Tobacco Products Used: Cigarettes  Quantity Used: 40  Quantity Per: day  Other Household Members Use Tobacco: Yes  Smoking Allowed in Home: Yes  Smoking Allowed in Vehicles: Yes  Medications Used in Past Attempts: Nicotine Patch, Bupropion, Varenicline  Side Effects: Patches- 1 was ineffective, wearing 2 caused pain in Adam veins; Chantix- homicidal ideation; Wellbutrin- dreams about killing Adam Keith  Previously seen by NDP?: Hospital Inpatient    Behavioral Assessment  Why Uses: 1. habit 2. stress-relief 3. distract from grief, pain  Reasons to Become Tobacco Free: health  Barriers/Challenges: 1. does not feel ready to quit yet 2. long-standing habit 3. stress 4. unsuccessful with cessation meds 5. wife also smokes  Strategies: 1. pt can make home and car smoke-free 2. pt can use hand to mouth replacements/spacing strategies to reduce use 3. pt can identify other activities that bring joy to help cope with stress/grief    NOTE: Pt continues to smoke 2ppd and expresses that while he would like to quit, he does not feel ready to do so now given increased stress related to Adam health. He states when the doctors tell you you're lucky to be alive, that's not what you want to hear. Pt has been feeling overwhelmed and continues to grieve the loss of Adam 74 year old son who passed away in 2019/10/02. SW provided emotional support and validated how making a big change right now feels difficult. Pt is aware if how Adam smoking is further contributing to Adam health complications, stating I know it's making things worse but you have to want to quit. Pt states wearing one patch is ineffective (I could eat cigarettes while wearing a patch) and that wearing two patches made it feel like peanut butter was going through my veins. It felt like my veins were shutting down. He states that medications that typically work for others rarely work for him. We discussed strategies to help pt behaviorally manage urges and triggers. SW encouraged pt to consider reducing use until ready to quit. SW provided education on how cessation aids in healing. SW provided pt with contact information, physical improvements related to tobacco cessation, and available resources (including outpt Tobacco Treatment Program at [facility] and [facility]).    Treatment Plan  Please see below for medication recommendations in bold.   Cessation Meds Currently Using: None  Medications Recommended During Hospitalization: Patient declines  Outpatient/Discharge Medications Recommended: Patient declines  Patient's Plan Post Discharge/Visit: Does not plan to quit in next 6 months      As part of this Telephone Visit, no in-person exam was conducted.     I personally spent 15 minutes counseling the patient via telephone about tobacco cessation.  I spent an additional 10 minutes on pre- and post-visit activities.      The patient was physically located in [place] or a state in which I am permitted to provide care. The patient and/or parent/guardian understood that s/he may incur co-pays and cost sharing, and agreed to the telemedicine visit. The visit was reasonable and appropriate under the circumstances given the patient's presentation at the time.     The patient and/or parent/guardian has been advised of the potential risks and limitations of this mode of treatment (including, but not limited to, the absence of in-person examination) and has agreed to be treated using telemedicine. The patient's/patient's family's questions regarding telemedicine have been answered.      If the visit was completed in an ambulatory setting, the patient and/or parent/guardian has also been advised to contact their provider???s office for worsening conditions, and seek emergency medical treatment and/or call 911 if the patient deems either necessary.     Visit Format/Coding: Telephone     Coding: 60454 (11-20 minutes)  Service rendered over the phone most consistent with: Tobacco cessation counseling, greater than 10 minutes (09811)     Sara Chu, LCSW, LCAS, NCTTP  Clinical Social Worker / Tobacco Treatment Specialist  Tobacco Treatment Program  [facility]  phone: [phone]  pager: [phone]

## 2022-01-21 NOTE — Unmapped (Cosign Needed)
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 male with PMH Ph+ B-ALL, remote MRSA PNA and Bacteremia, acute DVT of LLE on AC, and HTN, admitted for L testicular pain.    L. Testicular Pain  Presented with acute L testicular pain, with scrotal US showing hyperemia and heterogeneity c/f orchitis versus vascular congestion or leukemic involvement. Afebrile and HDS since admission. Pain and nausea unchanged since admission despite PCA. Imaging without acute abdominal/pelvic findings to explain testicular pain. U/A without bacteria and negative C/G: urine culture pending. Will follow culture to completion to rule out infectious etiology.  - s/p CT A/P w./w.out contrast - no acute abdominal/pelvic abnormalities  - Follow up Urine Culture  - PCA in place for pain  - Ice, elevation, scrotal support    Ph+ B-ALL  Follows with Dr. Senaida Ores. 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 Krusei), MRSA bacteremia w/ septic emboli c/b acute kidney failure and respiratory failure requiring CRRT and intubation. Treated with Dasatinib, difficulty tolerating due to edema. S/p C4 Ponatinib-Blinatumomab. Currently on Ponatinib monotherapy since 01/2021 with most recent BCR-ABL level of 0.002% in blood. Has decided against transplant. ANC of 6.3 on admission.  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 Holding Coreg.    Cancer related Pain  Patient endorses pain associated with Leukemia. Pain worsens with no identified provoking factors. Pain has improved with treatment.  -Current Regimen: Gabapentin 600 mg AM 900 mg PM, Nortriptyline 100 mg nightly, Oxycodone 10 mg nightly.  - PCA in place iso acute testicular pain     Immunocompromised status  Patient is immunocompromised secondary to ALL disease or chemotherapy  -Antimicrobial prophylaxis as above     Impending Electrolyte Abnormality   Secondary to Chemotherapy and/or IV Fluids  -Daily Electrolyte monitoring  -Replete per [facility] guidelines.      Impending Pancytopenia secondary to Acute Leukemia/chemotherapy:   - Transfuse 1 unit of pRBCs for Hb >7  - Transfuse 1 unit plt for PLT >10K     Nutrition:                        Subjective:     Interval History:  - Nauseous overnight requiring compazine x1  - Persistent testicular pain, started on PCA  - Intermittently desat to high 80's overnight: suspect OSA component  - Continues to endorse unchanged testicular pain this AM and some low level nausea. Otherwise denies fever/chills, abdominal pain, dysuria, penile discharge.    10 point ROS otherwise negative except as above in the HPI.     Objective:     Vital signs in last 24 hours:  Temp:  [36.5 ??C (97.7 ??F)-36.9 ??C (98.4 ??F)] 36.5 ??C (97.7 ??F)  Heart Rate:  [95-120] 103  SpO2 Pulse:  [104-107] 107  Resp:  [16-20] 16  BP: (129-163)/(81-108) 143/81  MAP (mmHg):  [94-1263] 94  SpO2:  [84 %-95 %] 94 %  BMI (Calculated):  [36.58] 36.58    Intake/Output last 3 shifts:  No intake/output data recorded.    Meds:  Current Facility-Administered Medications   Medication Dose Route Frequency Provider Last Rate Last Admin    amLODIPine (NORVASC) tablet 10 mg  10 mg Oral Daily Resweber, Collier Salina, MD   10 mg at 01/21/22 0900    apixaban (ELIQUIS) tablet 5 mg  5 mg Oral BID Resweber, Collier Salina, MD   5 mg at 01/21/22 0900    doxycycline (VIBRA-TABS) tablet 100 mg  100 mg Oral BID Cydney Ok, MD   100 mg at 01/21/22 0925    emollient combination no.92 (LUBRIDERM) lotion 1 application.  1 application. Topical Q1H PRN Resweber, Collier Salina, MD        gabapentin (NEURONTIN) capsule 600 mg  600 mg Oral Daily Resweber, Collier Salina, MD   600 mg at 01/21/22 0900    gabapentin (NEURONTIN) capsule 900 mg  900 mg Oral Nightly Resweber, Collier Salina, MD   900 mg at 01/20/22 2307    hydroCHLOROthiazide (HYDRODIURIL) tablet 12.5 mg  12.5 mg Oral Daily Resweber, Collier Salina, MD   12.5 mg at 01/21/22 1000    HYDROmorphone (DILAUDID) 50mg /64ml (1mg /ml) PCA CADD   Intravenous Continuous Chagarlamudi, Eloy End, MD   Stopped at 01/21/22 1311    loperamide (IMODIUM) capsule 2 mg  2 mg Oral Q2H PRN Resweber, Collier Salina, MD        loperamide (IMODIUM) capsule 4 mg  4 mg Oral Once PRN Resweber, Collier Salina, MD        mirtazapine (REMERON) tablet 15 mg  15 mg Oral Nightly Resweber, Collier Salina, MD   15 mg at 01/20/22 2259    naloxone (NARCAN) injection 0.4 mg  0.4 mg Intravenous Q5 Min PRN Chagarlamudi, Hemadhanvi, MD        nicotine (NICODERM CQ) 21 mg/24 hr patch 1 patch  1 patch Transdermal Daily PRN Resweber, Collier Salina, MD        nortriptyline (PAMELOR) capsule 100 mg  100 mg Oral Nightly Resweber, Collier Salina, MD   100 mg at 01/20/22 2259    ondansetron (ZOFRAN-ODT) disintegrating tablet 8 mg  8 mg Oral Q8H PRN Resweber, Collier Salina, MD        Or    ondansetron [facility]) injection 4 mg  4 mg Intravenous Q8H PRN Resweber, Collier Salina, MD        sodium chloride (NS) 0.9 % infusion  20 mL/hr Intravenous Continuous Resweber, Collier Salina, MD 20 mL/hr at 01/20/22 2301 20 mL/hr at 01/20/22 2301    [START ON 01/22/2022] sulfamethoxazole-trimethoprim (BACTRIM DS) 800-160 mg tablet 160 mg of trimethoprim  1 tablet Oral BID on Sat and Sun Resweber, Collier Salina, MD        umeclidinium-vilanteroL [facility] ELLIPTA) 62.5-25 mcg/actuation inhaler 1 puff  1 puff Inhalation Daily Resweber, Collier Salina, MD        valACYclovir (VALTREX) tablet 500 mg  500 mg Oral Daily Resweber, Collier Salina, MD   500 mg at 01/21/22 1000       Physical Exam:  General: Sleeping on side, snoring, in no acute distress.  Heart: RRR, S1/S2, no r/g/m  Lungs: Breathing comfortably on RA, snoring while asleep. No respiratory distress. Lungs CTAB  Abdomen: Soft, obese, non-distended, non-tender to palpation. BS+  Skin: No visible rashes on clothed exam  Neurologic: Sleeping but alert and oriented when woken. Responding appropriately to commands/questions.  Moving all 4 extremities spontaneously.  Psych: normal mood, appropriate affect.      Labs:  Recent Labs     01/19/22  1701 01/21/22  0402   WBC 9.2  --    NEUTROABS 6.3  --    LYMPHSABS 1.7  --    HGB 17.0*  --    HCT 48.3*  --    PLT 187  --    CREATININE 1.67* 1.55*   BUN 18 19   BILITOT 0.4 0.7   BILIDIR  --  0.20   AST 25 18   ALT 48 32   ALKPHOS 98 89   K 4.9* 4.0   MG  --  1.8   CALCIUM 9.0 9.0   NA 139 134*   CL 107 102   CO2 25.0 24.0   PHOS  --  4.1   INR 0.94  --        Imaging:  CT A/P w.out Contrast (10/19)  No evidence of acute intra-abdominal/pelvic pathology.     CT A/P w. Contrast (10/20)  No acute abdominal or pelvic findings, specifically no evidence of left renal vein thrombosis.    Pecola Lawless, MD, PGY-1  01/21/2022  3:10 PM   Hematology/Oncology Department   [facility]   Group pager: [phone]

## 2022-01-21 NOTE — Unmapped (Addendum)
Pt seen in room 75D with approval of the primary RN.  Pts admission was completed without family at the bedside.  An education assessment and initial education were completed.  A password was declined at this time and a HCDM was confirmed during the admissions questionnaire.   A flu vaccine was declined.     New VS were taken during the visit:  BP 129/99  - Pulse 111  - Temp 36.7 ??C (98 ??F) (Oral)  - Resp 20  - Ht 188 cm (6' 2.02)  - Wt (!) 129.3 kg (285 lb)  - SpO2 (!) 87% Comment: Poor Connection - BMI 36.58 kg/m??     The Pt complained of pain and received IV pain medication during the visit.  Pt denies any further needs at this time.

## 2022-01-21 NOTE — Unmapped (Signed)
Pt AOX4, patient pain controlled by PCA. Pt does desat at times of deep sleep, MD aware. No further orders for this issue.  Problem: Adult Inpatient Plan of Care  Goal: Plan of Care Review  01/21/2022 0229 by Honor Junes, RN  Outcome: Progressing  01/21/2022 0048 by Honor Junes, RN  Outcome: Progressing  Goal: Patient-Specific Goal (Individualized)  01/21/2022 0229 by Honor Junes, RN  Outcome: Progressing  01/21/2022 0048 by Honor Junes, RN  Outcome: Progressing  Goal: Absence of Hospital-Acquired Illness or Injury  01/21/2022 0229 by Honor Junes, RN  Outcome: Progressing  01/21/2022 0048 by Honor Junes, RN  Outcome: Progressing  Intervention: Identify and Manage Fall Risk  Recent Flowsheet Documentation  Taken 01/21/2022 0212 by Honor Junes, RN  Safety Interventions:   fall reduction program maintained   lighting adjusted for tasks/safety   low bed  Taken 01/21/2022 0032 by Honor Junes, RN  Safety Interventions:   lighting adjusted for tasks/safety   low bed  Intervention: Prevent and Manage VTE (Venous Thromboembolism) Risk  Recent Flowsheet Documentation  Taken 01/21/2022 0032 by Honor Junes, RN  Activity Management: activity adjusted per tolerance  Goal: Optimal Comfort and Wellbeing  01/21/2022 0229 by Honor Junes, RN  Outcome: Progressing  01/21/2022 0048 by Honor Junes, RN  Outcome: Progressing  Goal: Readiness for Transition of Care  01/21/2022 0229 by Honor Junes, RN  Outcome: Progressing  01/21/2022 0048 by Honor Junes, RN  Outcome: Progressing  Goal: Rounds/Family Conference  01/21/2022 0229 by Honor Junes, RN  Outcome: Progressing  01/21/2022 0048 by Honor Junes, RN  Outcome: Progressing     Problem: COPD (Chronic Obstructive Pulmonary Disease) Comorbidity  Goal: Maintenance of COPD Symptom Control  01/21/2022 0229 by Honor Junes, RN  Outcome: Progressing  01/21/2022 0048 by Honor Junes, RN  Outcome: Progressing     Problem: Pain Chronic (Persistent) (Comorbidity Management)  Goal: Acceptable Pain Control and Functional Ability  01/21/2022 0229 by Honor Junes, RN  Outcome: Progressing  01/21/2022 0048 by Honor Junes, RN  Outcome: Progressing     Problem: Infection  Goal: Absence of Infection Signs and Symptoms  01/21/2022 0229 by Honor Junes, RN  Outcome: Progressing  01/21/2022 0048 by Honor Junes, RN  Outcome: Progressing     Problem: Impaired Wound Healing  Goal: Optimal Wound Healing  Outcome: Progressing  Intervention: Promote Wound Healing  Recent Flowsheet Documentation  Taken 01/21/2022 0032 by Honor Junes, RN  Activity Management: activity adjusted per tolerance

## 2022-01-22 LAB — BASIC METABOLIC PANEL
ANION GAP: 10 mmol/L (ref 5–14)
BLOOD UREA NITROGEN: 19 mg/dL (ref 9–23)
BUN / CREAT RATIO: 11
CALCIUM: 9.1 mg/dL (ref 8.7–10.4)
CHLORIDE: 104 mmol/L (ref 98–107)
CO2: 25 mmol/L (ref 20.0–31.0)
CREATININE: 1.76 mg/dL — ABNORMAL HIGH
EGFR CKD-EPI (2021) MALE: 49 mL/min/{1.73_m2} — ABNORMAL LOW (ref >=60)
GLUCOSE RANDOM: 132 mg/dL (ref 70–179)
SODIUM: 139 mmol/L (ref 135–145)

## 2022-01-22 LAB — CBC W/ AUTO DIFF
BASOPHILS ABSOLUTE COUNT: 0.1 10*9/L (ref 0.0–0.1)
BASOPHILS RELATIVE PERCENT: 1.4 %
EOSINOPHILS ABSOLUTE COUNT: 0.3 10*9/L (ref 0.0–0.5)
EOSINOPHILS RELATIVE PERCENT: 4.8 %
HEMATOCRIT: 45.7 % (ref 39.0–48.0)
HEMOGLOBIN: 15.9 g/dL (ref 12.9–16.5)
LYMPHOCYTES ABSOLUTE COUNT: 1.4 10*9/L (ref 1.1–3.6)
LYMPHOCYTES RELATIVE PERCENT: 25 %
MEAN CORPUSCULAR HEMOGLOBIN CONC: 34.8 g/dL (ref 32.0–36.0)
MEAN CORPUSCULAR HEMOGLOBIN: 29.7 pg (ref 25.9–32.4)
MEAN CORPUSCULAR VOLUME: 85.3 fL (ref 77.6–95.7)
MEAN PLATELET VOLUME: 8.4 fL (ref 6.8–10.7)
MONOCYTES ABSOLUTE COUNT: 0.7 10*9/L (ref 0.3–0.8)
MONOCYTES RELATIVE PERCENT: 12.5 %
NEUTROPHILS ABSOLUTE COUNT: 3.3 10*9/L (ref 1.8–7.8)
NEUTROPHILS RELATIVE PERCENT: 56.3 %
PLATELET COUNT: 164 10*9/L (ref 150–450)
RED BLOOD CELL COUNT: 5.36 10*12/L (ref 4.26–5.60)
RED CELL DISTRIBUTION WIDTH: 16 % — ABNORMAL HIGH (ref 12.2–15.2)
WBC ADJUSTED: 5.8 10*9/L (ref 3.6–11.2)

## 2022-01-22 LAB — MAGNESIUM: MAGNESIUM: 2 mg/dL (ref 1.6–2.6)

## 2022-01-22 LAB — POTASSIUM: POTASSIUM: 3.7 mmol/L (ref 3.4–4.8)

## 2022-01-22 LAB — PHOSPHORUS: PHOSPHORUS: 4.5 mg/dL (ref 2.4–5.1)

## 2022-01-22 MED ADMIN — sulfamethoxazole-trimethoprim (BACTRIM DS) 800-160 mg tablet 160 mg of trimethoprim: 1 | ORAL | @ 13:00:00 | Stop: 2022-01-22

## 2022-01-22 MED ADMIN — HYDROmorphone (PF) (DILAUDID) injection 1 mg: 1 mg | INTRAVENOUS | @ 05:00:00 | Stop: 2022-01-22

## 2022-01-22 MED ADMIN — acetaminophen (TYLENOL) tablet 1,000 mg: 1000 mg | ORAL | @ 11:00:00 | Stop: 2022-01-22

## 2022-01-22 MED ADMIN — amLODIPine (NORVASC) tablet 10 mg: 10 mg | ORAL | @ 13:00:00 | Stop: 2022-01-22

## 2022-01-22 MED ADMIN — apixaban (ELIQUIS) tablet 5 mg: 5 mg | ORAL | @ 02:00:00

## 2022-01-22 MED ADMIN — gabapentin (NEURONTIN) capsule 600 mg: 600 mg | ORAL | @ 13:00:00 | Stop: 2022-01-22

## 2022-01-22 MED ADMIN — valACYclovir (VALTREX) tablet 500 mg: 500 mg | ORAL | @ 13:00:00 | Stop: 2022-01-22

## 2022-01-22 MED ADMIN — doxycycline (VIBRA-TABS) tablet 100 mg: 100 mg | ORAL | @ 13:00:00 | Stop: 2022-01-22

## 2022-01-22 MED ADMIN — hydroCHLOROthiazide (HYDRODIURIL) tablet 12.5 mg: 12.5 mg | ORAL | @ 13:00:00 | Stop: 2022-01-22

## 2022-01-22 MED ADMIN — acetaminophen (TYLENOL) tablet 1,000 mg: 1000 mg | ORAL | @ 05:00:00 | Stop: 2022-01-22

## 2022-01-22 MED ADMIN — mirtazapine (REMERON) tablet 15 mg: 15 mg | ORAL | @ 02:00:00

## 2022-01-22 MED ADMIN — nortriptyline (PAMELOR) capsule 100 mg: 100 mg | ORAL | @ 02:00:00

## 2022-01-22 MED ADMIN — apixaban (ELIQUIS) tablet 5 mg: 5 mg | ORAL | @ 13:00:00 | Stop: 2022-01-22

## 2022-01-22 MED ADMIN — gabapentin (NEURONTIN) capsule 900 mg: 900 mg | ORAL | @ 02:00:00

## 2022-01-22 MED ADMIN — oxyCODONE (ROXICODONE) immediate release tablet 10 mg: 10 mg | ORAL | @ 11:00:00 | Stop: 2022-01-22

## 2022-01-22 MED ADMIN — doxycycline (VIBRA-TABS) tablet 100 mg: 100 mg | ORAL | @ 02:00:00 | Stop: 2022-02-19

## 2022-01-22 MED ADMIN — HYDROmorphone (PF) (DILAUDID) injection 0.5 mg: .5 mg | INTRAVENOUS | @ 02:00:00 | Stop: 2022-01-22

## 2022-01-22 NOTE — Unmapped (Signed)
UROLOGY FOLLOW UP REQUEST    Requesting physician/contact for questions: [place], MD PhD      Date(s) Needed: 2-3 weeks     Appt to be scheduled under: benign APP     Type of Appt: follow up after testicular pain, negative work up    Need to contact patient: Yes please    Special Requests/Instructions: None    Orders placed for Special Requests: N/A

## 2022-01-22 NOTE — Unmapped (Signed)
Problem: Adult Inpatient Plan of Care  Goal: Plan of Care Review  Outcome: Ongoing - Unchanged  Goal: Patient-Specific Goal (Individualized)  Outcome: Ongoing - Unchanged  Goal: Absence of Hospital-Acquired Illness or Injury  Outcome: Ongoing - Unchanged  Intervention: Identify and Manage Fall Risk  Recent Flowsheet Documentation  Taken 01/22/2022 1200 by Darleen Crocker, RN  Safety Interventions: low bed  Taken 01/22/2022 0800 by Darleen Crocker, RN  Safety Interventions: low bed  Intervention: Prevent and Manage VTE (Venous Thromboembolism) Risk  Recent Flowsheet Documentation  Taken 01/22/2022 1200 by Darleen Crocker, RN  Activity Management: activity adjusted per tolerance  Taken 01/22/2022 0800 by Darleen Crocker, RN  Activity Management: activity adjusted per tolerance  Goal: Optimal Comfort and Wellbeing  Outcome: Ongoing - Unchanged  Goal: Readiness for Transition of Care  Outcome: Ongoing - Unchanged  Goal: Rounds/Family Conference  Outcome: Ongoing - Unchanged     Problem: COPD (Chronic Obstructive Pulmonary Disease) Comorbidity  Goal: Maintenance of COPD Symptom Control  Outcome: Ongoing - Unchanged     Problem: Pain Chronic (Persistent) (Comorbidity Management)  Goal: Acceptable Pain Control and Functional Ability  Outcome: Ongoing - Unchanged     Problem: Infection  Goal: Absence of Infection Signs and Symptoms  Outcome: Ongoing - Unchanged     Problem: Impaired Wound Healing  Goal: Optimal Wound Healing  Outcome: Ongoing - Unchanged  Intervention: Promote Wound Healing  Recent Flowsheet Documentation  Taken 01/22/2022 1200 by Darleen Crocker, RN  Activity Management: activity adjusted per tolerance  Taken 01/22/2022 0800 by Darleen Crocker, RN  Activity Management: activity adjusted per tolerance     Problem: VTE (Venous Thromboembolism)  Goal: VTE (Venous Thromboembolism) Symptom Resolution  Outcome: Ongoing - Unchanged

## 2022-01-22 NOTE — Unmapped (Signed)
Problem: Adult Inpatient Plan of Care  Goal: Plan of Care Review  01/22/2022 1307 by Darleen Crocker, RN  Outcome: Resolved  01/22/2022 1223 by Darleen Crocker, RN  Outcome: Ongoing - Unchanged  Goal: Patient-Specific Goal (Individualized)  01/22/2022 1307 by Darleen Crocker, RN  Outcome: Resolved  01/22/2022 1223 by Darleen Crocker, RN  Outcome: Ongoing - Unchanged  Goal: Absence of Hospital-Acquired Illness or Injury  01/22/2022 1307 by Darleen Crocker, RN  Outcome: Resolved  01/22/2022 1223 by Darleen Crocker, RN  Outcome: Ongoing - Unchanged  Intervention: Identify and Manage Fall Risk  Recent Flowsheet Documentation  Taken 01/22/2022 1200 by Darleen Crocker, RN  Safety Interventions: low bed  Taken 01/22/2022 0800 by Darleen Crocker, RN  Safety Interventions: low bed  Intervention: Prevent and Manage VTE (Venous Thromboembolism) Risk  Recent Flowsheet Documentation  Taken 01/22/2022 1200 by Darleen Crocker, RN  Activity Management: activity adjusted per tolerance  Taken 01/22/2022 0800 by Darleen Crocker, RN  Activity Management: activity adjusted per tolerance  Goal: Optimal Comfort and Wellbeing  01/22/2022 1307 by Darleen Crocker, RN  Outcome: Resolved  01/22/2022 1223 by Darleen Crocker, RN  Outcome: Ongoing - Unchanged  Goal: Readiness for Transition of Care  01/22/2022 1307 by Darleen Crocker, RN  Outcome: Resolved  01/22/2022 1223 by Darleen Crocker, RN  Outcome: Ongoing - Unchanged  Goal: Rounds/Family Conference  01/22/2022 1307 by Darleen Crocker, RN  Outcome: Resolved  01/22/2022 1223 by Darleen Crocker, RN  Outcome: Ongoing - Unchanged     Problem: COPD (Chronic Obstructive Pulmonary Disease) Comorbidity  Goal: Maintenance of COPD Symptom Control  01/22/2022 1307 by Darleen Crocker, RN  Outcome: Resolved  01/22/2022 1223 by Darleen Crocker, RN  Outcome: Ongoing - Unchanged     Problem: Pain Chronic (Persistent) (Comorbidity Management)  Goal: Acceptable Pain Control and Functional Ability  01/22/2022 1307 by Darleen Crocker, RN  Outcome: Resolved  01/22/2022 1223 by Darleen Crocker, RN  Outcome: Ongoing - Unchanged     Problem: Infection  Goal: Absence of Infection Signs and Symptoms  01/22/2022 1307 by Darleen Crocker, RN  Outcome: Resolved  01/22/2022 1223 by Darleen Crocker, RN  Outcome: Ongoing - Unchanged     Problem: Impaired Wound Healing  Goal: Optimal Wound Healing  01/22/2022 1307 by Darleen Crocker, RN  Outcome: Resolved  01/22/2022 1223 by Darleen Crocker, RN  Outcome: Ongoing - Unchanged  Intervention: Promote Wound Healing  Recent Flowsheet Documentation  Taken 01/22/2022 1200 by Darleen Crocker, RN  Activity Management: activity adjusted per tolerance  Taken 01/22/2022 0800 by Darleen Crocker, RN  Activity Management: activity adjusted per tolerance     Problem: VTE (Venous Thromboembolism)  Goal: VTE (Venous Thromboembolism) Symptom Resolution  01/22/2022 1307 by Darleen Crocker, RN  Outcome: Resolved  01/22/2022 1223 by Darleen Crocker, RN  Outcome: Ongoing - Unchanged

## 2022-01-22 NOTE — Unmapped (Signed)
Alert and oriented, complained of pain to abdomen/scrotum, stated that the oxycodone was not helping at all and he needed the IV Dilaudid. Order obtained and 0.5mg  given x1. Only lasted about an hour and patient requesting something else. Order obtained for tylenol and IV Dilaudid, patient encouraged to alternate the oxycodone and dilaudid. Better relief obtained from 1mg  dilaudid. Wife at bedside. Continue to monitor    Problem: Adult Inpatient Plan of Care  Goal: Plan of Care Review  Outcome: Ongoing - Unchanged  Goal: Patient-Specific Goal (Individualized)  Outcome: Ongoing - Unchanged  Goal: Absence of Hospital-Acquired Illness or Injury  Outcome: Progressing  Intervention: Identify and Manage Fall Risk  Recent Flowsheet Documentation  Taken 01/21/2022 2000 by Erskine Squibb, RN  Safety Interventions:   lighting adjusted for tasks/safety   low bed   nonskid shoes/slippers when out of bed  Intervention: Prevent and Manage VTE (Venous Thromboembolism) Risk  Recent Flowsheet Documentation  Taken 01/21/2022 2000 by Erskine Squibb, RN  Activity Management: up ad lib  Goal: Optimal Comfort and Wellbeing  Outcome: Ongoing - Unchanged  Goal: Readiness for Transition of Care  Outcome: Ongoing - Unchanged  Goal: Rounds/Family Conference  Outcome: Ongoing - Unchanged     Problem: COPD (Chronic Obstructive Pulmonary Disease) Comorbidity  Goal: Maintenance of COPD Symptom Control  Outcome: Ongoing - Unchanged     Problem: Pain Chronic (Persistent) (Comorbidity Management)  Goal: Acceptable Pain Control and Functional Ability  Outcome: Ongoing - Unchanged     Problem: Infection  Goal: Absence of Infection Signs and Symptoms  Outcome: Ongoing - Unchanged     Problem: Impaired Wound Healing  Goal: Optimal Wound Healing  Outcome: Ongoing - Unchanged  Intervention: Promote Wound Healing  Recent Flowsheet Documentation  Taken 01/21/2022 2000 by Erskine Squibb, RN  Activity Management: up ad lib     Problem: VTE (Venous Thromboembolism)  Goal: VTE (Venous Thromboembolism) Symptom Resolution  Outcome: Ongoing - Unchanged

## 2022-01-22 NOTE — Unmapped (Signed)
Physician Discharge Summary [facility]  1 [facility] OBSERVATION [facility]  [address]  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 *      Outpatient Follow-Up Considerations:  [ ]  Testicular Pain - Follow up resolution of pain vs Infectious studies (NGTD at time of discharge)    [ ]  Mumps - IgG level pending at time of discharge     Hospital Course:    Adam Keith is a 41 y.o. male with PMH Ph+ B-ALL, remote MRSA PNA and Bacteremia, acute DVT of LLE on AC, and HTN, who was admitted for L testicular pain.    L. Testicular Pain  Presented with acute L testicular pain for 1.5 days that woke him from sleep; no pain with urination, no new sexual partners. Scrotal U/S in ED showed heterogenous and hyperemic L Testicle concerning for vascular congestion versus orchitis versus leukemic involvement. CT A/P with and without contrast were negative for acute abdominal/pelvic pathology. U/A without bacteria and C/G test negative: urine culture from admission with < 10K bacteria. He remained afebrile with mild improvement of pain, managed with pain medications similar to home regiment. He was discharged with plan for Urology follow up as outpatient and close return precautions if symptoms worsen.    Ph+ B-ALL  Follows with Dr. Senaida Ores. 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 Krusei), MRSA bacteremia w/ septic emboli c/b acute kidney failure and respiratory failure requiring CRRT and intubation. Treated with Dasatinib, difficulty tolerating due to edema. S/p C4 Ponatinib-Blinatumomab. Currently on Ponatinib monotherapy since 01/2021 with most recent BCR-ABL level of 0.002% in blood. Has decided against transplant. ANC of 6.3 on admission. Continued his home Pontatinib and prophylactic medications (Valtrex, Doxycycline, Bactrim).    Chronic Problems  L Popliteal DVT on Eliquis - Continued on Eliquis  Hx MRSA - Continued on Doxycycline ppx  HTN - Continue home amlodipine and hydrochlorothiazide; held Coreg on admission, resumed on discharge    Outpatient Provider Follow Up Issues:   See Above    Touchbase with Outpatient Provider:  Warm Handoff: Completed on 01/22/22 by Pecola Lawless, MD  (Intern) via [facility] Message    Procedures:  None  ______________________________________________________________________  Discharge Medications:      Your Medication List        CHANGE how you take these medications      apixaban 5 mg Tab  Commonly known as: ELIQUIS  Take 1 tablet (5 mg total) by mouth two (2) times a day.            CONTINUE taking these medications      amLODIPine 10 MG  tablet  Commonly known as: NORVASC  Take 1 tablet (10 mg total) by mouth daily.     buprenorphine 5 mcg/hour Ptwk transdermal patch  Place 1 patch on the skin every seven (7) days for 28 days.     carvediloL 12.5 MG tablet  Commonly known as: COREG  Take 1 tablet (12.5 mg total) by mouth Two (2) times a day.     docusate sodium 100 MG capsule  Commonly known as: COLACE  Take 1 capsule (100 mg total) by mouth daily.     doxycycline 100 MG tablet  Commonly known as: VIBRA-TABS  Take 1 tablet (100 mg total) by mouth Two (2) times a day.     gabapentin 300 MG capsule  Commonly known as: NEURONTIN  Take 2 capsules (600mg ) by mouth in the morning and 3 capsules (900mg ) at bedtime. May cause drowsiness.     hydroCHLOROthiazide 12.5 MG tablet  Commonly known as: HYDRODIURIL  Take 1 tablet (12.5 mg total) by mouth daily.     ICLUSIG 30 mg tablet  Generic drug: PONATinib  Take 1 tablet (30 mg total) by mouth daily. Swallow tablets whole. Do not crush, break, cut or chew tablets.     mirtazapine 15 MG tablet  Commonly known as: REMERON  TAKE 1 TABLET BY MOUTH EVERY DAY AT NIGHT     nortriptyline 50 MG capsule  Commonly known as: PAMELOR  Take 2 capsules (100 mg total) by mouth nightly.     omeprazole 40 MG capsule  Commonly known as: PriLOSEC  Take 1 capsule (40 mg total) by mouth every morning.     oxyCODONE 10 mg Tr12 12 hr crush resistant ER/CR tablet  Commonly known as: OxyCONTIN  Take 1 tablet (10 mg total) by mouth nightly for 28 days.     spironolactone 25 MG tablet  Commonly known as: ALDACTONE  Take 1 tablet (25 mg total) by mouth daily.     sulfamethoxazole-trimethoprim 800-160 mg per tablet  Commonly known as: BACTRIM DS  TAKE 1 TABLET BY MOUTH 2 TIMES A DAY ON SATURDAY, SUNDAY. FOR PROPHYLAXIS WHILE ON CHEMO.     umeclidinium-vilanteroL 62.5-25 mcg/actuation inhaler  Commonly known as: ANORO ELLIPTA  Inhale 1 puff daily.     valACYclovir 500 MG tablet  Commonly known as: VALTREX  Take 1 tablet (500 mg total) by mouth daily.     WRIST BRACE Misc  Generic drug: arm brace  1 Piece by Miscellaneous route as needed. left ulnar wrist brace              Allergies:  Bupropion hcl, Cefepime, Ceftaroline fosamil, Dapsone, Onion, Vancomycin analogues, Bismuth subsalicylate, Privigen [immun glob g(igg)-pro-iga 0-50], Furosemide, and Gammagard  ______________________________________________________________________  Pending Test Results:  Pending Labs       Order Current Status    Mumps antibody, IgG In process            Most Recent Labs:  All lab results last 24 hours -   Recent Results (from the past 24 hour(s))   CBC w/ Differential    Collection Time: 01/22/22  4:51 AM   Result Value Ref Range    WBC 5.8 3.6 - 11.2 10*9/L    RBC 5.36 4.26 - 5.60 10*12/L    HGB 15.9 12.9 - 16.5 g/dL    HCT 16.1 09.6 - 04.5 %    MCV 85.3 77.6 - 95.7 fL    MCH 29.7 25.9 - 32.4 pg    MCHC 34.8  32.0 - 36.0 g/dL    RDW 16.1 (H) 09.6 - 15.2 %    MPV 8.4 6.8 - 10.7 fL    Platelet 164 150 - 450 10*9/L    Neutrophils % 56.3 %    Lymphocytes % 25.0 %    Monocytes % 12.5 %    Eosinophils % 4.8 %    Basophils % 1.4 %    Absolute Neutrophils 3.3 1.8 - 7.8 10*9/L    Absolute Lymphocytes 1.4 1.1 - 3.6 10*9/L    Absolute Monocytes 0.7 0.3 - 0.8 10*9/L    Absolute Eosinophils 0.3 0.0 - 0.5 10*9/L    Absolute Basophils 0.1 0.0 - 0.1 10*9/L   Basic Metabolic Panel    Collection Time: 01/22/22  4:52 AM   Result Value Ref Range    Sodium 139 135 - 145 mmol/L    Potassium      Chloride 104 98 - 107 mmol/L    CO2 25.0 20.0 - 31.0 mmol/L    Anion Gap 10 5 - 14 mmol/L    BUN 19 9 - 23 mg/dL    Creatinine 0.45 (H) 0.60 - 1.10 mg/dL    BUN/Creatinine Ratio 11     eGFR CKD-EPI (2021) Male 49 (L) >=60 mL/min/1.52m2    Glucose 132 70 - 179 mg/dL    Calcium 9.1 8.7 - 40.9 mg/dL   Magnesium Level    Collection Time: 01/22/22  4:52 AM   Result Value Ref Range    Magnesium 2.0 1.6 - 2.6 mg/dL   Phosphorus Level    Collection Time: 01/22/22  4:52 AM   Result Value Ref Range    Phosphorus 4.5 2.4 - 5.1 mg/dL   Potassium Level    Collection Time: 01/22/22 11:06 AM   Result Value Ref Range    Potassium 3.7 3.4 - 4.8 mmol/L     Microbiology -   Microbiology Results (last day)       Procedure Component Value Date/Time Date/Time    Urine Culture [[id number]]  (Normal) Collected: 01/20/22 1957    Lab Status: Final result Specimen: Urine from Clean Catch Updated: 01/22/22 1037     Urine Culture, Comprehensive <10,000 CFU/mL    Narrative:      Specimen Source: Clean Catch            Relevant Studies/Radiology:  CT Abdomen Pelvis W Contrast    Result Date: 01/21/2022  EXAM: CT ABDOMEN PELVIS W CONTRAST ACCESSION: [id number] UN CLINICAL INDICATION: 41 years old with CTV A/P w/ contrast to evaluate for left renal vein thrombosis in s/o acute onset left testicular pain in s/o known acute LLE DVT on eliquis  COMPARISON: CT abdomen pelvis 01/20/2022 TECHNIQUE: A helical CT scan of the abdomen and pelvis was obtained following IV contrast from the lung bases through the pubic symphysis. Images were reconstructed in the axial plane. Coronal and sagittal reformatted images were also provided for further evaluation. FINDINGS: LOWER CHEST: Bibasilar atelectasis/scarring, left greater than right. High density of left ventricular wall, further evaluated on recent echocardiogram. LIVER: Normal liver contour.  No focal liver lesions. Hepatic steatosis. BILIARY: The gallbladder is normal in appearance. No biliary ductal dilatation.  SPLEEN: Mild enlargement of the spleen in craniocaudal dimension measuring up to 14.4 cm. PANCREAS: Mild diffuse atrophy. No focal lesions.  No ductal dilation. ADRENAL GLANDS: Normal appearance of the adrenal glands. KIDNEYS/URETERS: Symmetric renal enhancement.  No hydronephrosis.  No solid renal mass. BLADDER: Unremarkable. REPRODUCTIVE ORGANS: Unremarkable. GI TRACT: No findings of  bowel obstruction or acute inflammation.  The appendix is surgically absent. Colonic diverticula. PERITONEUM, RETROPERITONEUM AND MESENTERY: No free air.  No ascites.  No fluid collection. LYMPH NODES: No adenopathy. VESSELS: Hepatic and portal veins are patent.  Normal caliber aorta. No evidence of renal vein thrombosis. BONES and SOFT TISSUES: No aggressive osseous lesions.  No focal soft tissue lesions.     No acute abdominal or pelvic findings, specifically no evidence of left renal vein thrombosis.     ECG 12 Lead    Result Date: 01/21/2022  SINUS TACHYCARDIA POSSIBLE LEFT ATRIAL ENLARGEMENT BORDERLINE ECG WHEN COMPARED WITH ECG OF 19-Oct-2021 06:44, NO SIGNIFICANT CHANGE WAS FOUND Confirmed by Christella Noa ([id number]) on 01/21/2022 7:39:19 AM    CT Abdomen Pelvis Wo Contrast    Result Date: 01/20/2022  EXAM: CT ABDOMEN PELVIS WO CONTRAST ACCESSION: [id number] UN CLINICAL INDICATION: 41 years old with eval LLQ pain, patient denied contrasted scan  COMPARISON: 03/15/2021 CT abdomen pelvis. TECHNIQUE: A spiral CT scan was obtained without IV contrast from the lung bases to the pubic symphysis.  Images were reconstructed in the axial plane. Coronal and sagittal reformatted images were also provided for further evaluation. Evaluation of the solid organs and vasculature is limited in the absence of intravenous contrast. FINDINGS: LOWER CHEST: High density of the left ventricular wall, further evaluated on recent echocardiogram. LIVER: Normal liver contour. No focal liver lesion on non-contrast examination. BILIARY: The gallbladder is normal in appearance. No intrahepatic biliary ductal dilatation. SPLEEN: Normal in size and contour. PANCREAS: Normal pancreatic contour without signs of inflammation or gross ductal dilatation. ADRENAL GLANDS: Normal appearance of the adrenal glands. KIDNEYS/URETERS: Smooth renal contours.  No nephrolithiasis.  No ureteral dilatation or collecting system distention. BLADDER: Unremarkable. REPRODUCTIVE ORGANS: Unremarkable. GI TRACT: Nondistended unremarkable stomach. Loops of small bowel are normal in caliber. Nondilated appendix with punctate appendicoliths. No adjacent secondary inflammatory changes. Colonic diverticulosis. No evidence of acute colonic pathology. PERITONEUM/RETROPERITONEUM AND MESENTERY: No free air. No ascites. No fluid collection. VASCULATURE: Normal caliber aorta. Otherwise, limited evaluation without contrast. LYMPH NODES: No adenopathy. BONES and SOFT TISSUES: No aggressive osseous lesions. No focal soft tissue lesions.     No evidence of acute intra-abdominal/pelvic pathology.     US Scrotum and Testes    Result Date: 01/19/2022  EXAM: US SCROTUM DATE: 01/19/2022 ACCESSION: [id number] UN DICTATED: 01/19/2022 6:20 PM INTERPRETATION LOCATION: [facility] CLINICAL INDICATION: 41 years old Male with acute left sided testicular pain COMPARISON: None TECHNIQUE:  Ultrasound views of the scrotum were obtained using gray scale and color Doppler imaging. Spectral Doppler imaging was also performed. FINDINGS: TESTICLES: The right testis is mildly heterogenous in echotexture. The left testis is moderately heterogeneous in echotexture with scattered areas of low echogenicity relative to the rest of the parenchyma. No discrete masses are seen appropriate arterial inflow and venous outflow of the testicles was documented with color and spectral Doppler imaging. Left testes appears mildly hyperemic compared to the right.      Right testicle: Sagittal 4.7 cm; AP 3.0 cm; Transverse 3.4 cm      Left testicle: Sagittal 4.6 cm; AP 2.8 cm; Transverse 3.7 cm EPIDIDYMIS:  Appropriate flow was seen in the epididymal heads.      Right epididymis head: 1.1 cm      Left epididymis head: 0.70 cm OTHER: Small left fat-containing inguinal hernia. No hydroceles noted.     Left testicle is heterogenous and hyperemic. Differential is edema from vascular congestion versus orchitis or leukemic involvement.  Normal arterial and venous waveforms are present. No abnormalities of the right testis.   ______________________________________________________________________  Discharge Instructions:   Activity Instructions       Activity as tolerated            You were admitted to [facility] for testicular pain. You are now ready for discharge with close out-of-hospital follow-up.      Please carefully read and follow these instructions below upon your discharge:     1) Please take your medications as prescribed and note the changes listed on your discharge. At future follow-up appointments, please be sure to take all of your medications with you so your provider can better guide your care.      Medication Changes:   - CHANGE how you take Eliquis     2) Seek medical care with your primary care doctor or local Emergency Room or Urgent Care if you develop any changes in your mental status, worsening abdominal pain, fevers greater than 101.5, any unexplained/unrelieved shortness of breath, uncontrolled nausea and vomiting that keeps you from remaining hydrated or taking your medication, or any other concerning symptoms.      3) Please go to your follow-up appointments. Some of your follow-up appointments have been listed below. If you do not see an appointment listed below with your primary care doctor, please call your doctor's office as soon as possible to schedule an appointment to be seen within 7-10 days of discharge.      Outpatient Follow-up:  - You will need to follow up with your primary care physician within 7-10 days. We have placed this referral for you. If you do not hear from them, please call Dierdre Harness, MD [phone].   - You will also need to follow up with [facility] Hematology/Oncology and [facility] Urology. Please call them if you do not hear from them.     [facility] Urology [phone]   [facility] Hematology Oncology: [phone]     4) If you have any concerns before you are able to follow-up with your primary care doctor, you can reach [place] by calling [phone].                Appointments which have been scheduled for you      Jan 24, 2022 11:30 AM  (Arrive by 11:00 AM)  LAB ONLY [place] with ADULT ONC LAB  [facility] ADULT ONCOLOGY LAB DRAW STATION [facility] REGION) [address]  [place] [address]  [phone]        Mar 03, 2022  8:00 AM  (Arrive by 7:30 AM)  NURSE LAB DRAW with ADULT ONC LAB  [facility] ADULT ONCOLOGY LAB DRAW STATION [facility] REGION) [address]  [place] [address]  [phone]        Mar 03, 2022  9:00 AM  (Arrive by 8:30 AM)  RETURN ACTIVE [place] with Lenon Ahmadi, [place]  [facility] HEMATOLOGY ONCOLOGY 2ND FLR CANCER HOSP [facility] REGION) [address]  [place] [address]  [phone]        Mar 03, 2022  9:30 AM  (Arrive by 9:00 AM)  RETURN PALLIATIVE CARE with Everlena Cooper, MD  [facility] HEMATOLOGY ONCOLOGY 2ND FLR CANCER HOSP [facility] REGION) [address]  [place] [address]  [phone]             ______________________________________________________________________  Discharge Day Services:  BP 132/83  -  Pulse 110  - Temp 36.3 ??C (97.3 ??F) (Oral)  - Resp 18  - Ht 188 cm (6' 2.02)  - Wt (!) 131.7 kg (290 lb 5.5 oz)  - SpO2 94%  - BMI 37.26 kg/m??     Pt seen on the day of discharge and determined appropriate for discharge.    Condition at Discharge: stable    Length of Discharge: I spent greater than 30 mins in the discharge of this patient.

## 2022-01-24 NOTE — Unmapped (Signed)
You're welcome!

## 2022-01-24 NOTE — Unmapped (Signed)
He's been scheduled.      Thanks,  [employer]

## 2022-01-28 ENCOUNTER — Ambulatory Visit: Admit: 2022-01-28 | Payer: MEDICAID

## 2022-01-31 DIAGNOSIS — C9101 Acute lymphoblastic leukemia, in remission: Principal | ICD-10-CM

## 2022-02-04 ENCOUNTER — Encounter: Payer: Self-pay | Admitting: Emergency Medicine

## 2022-02-04 ENCOUNTER — Inpatient Hospital Stay
Admission: EM | Admit: 2022-02-04 | Discharge: 2022-02-05 | DRG: 175 | Disposition: A | Discharge status: Home / Self Care | Payer: Medicaid Other | Attending: Internal Medicine | Admitting: Internal Medicine

## 2022-02-04 ENCOUNTER — Emergency Department: Payer: Medicaid Other

## 2022-02-04 ENCOUNTER — Other Ambulatory Visit: Payer: Self-pay

## 2022-02-04 DIAGNOSIS — J9601 Acute respiratory failure with hypoxia: Secondary | ICD-10-CM | POA: Diagnosis present

## 2022-02-04 DIAGNOSIS — F17210 Nicotine dependence, cigarettes, uncomplicated: Secondary | ICD-10-CM | POA: Diagnosis present

## 2022-02-04 DIAGNOSIS — Z87442 Personal history of urinary calculi: Secondary | ICD-10-CM | POA: Diagnosis not present

## 2022-02-04 DIAGNOSIS — Z79899 Other long term (current) drug therapy: Secondary | ICD-10-CM | POA: Diagnosis not present

## 2022-02-04 DIAGNOSIS — I2699 Other pulmonary embolism without acute cor pulmonale: Secondary | ICD-10-CM | POA: Diagnosis present

## 2022-02-04 DIAGNOSIS — C9101 Acute lymphoblastic leukemia, in remission: Secondary | ICD-10-CM

## 2022-02-04 DIAGNOSIS — Z888 Allergy status to other drugs, medicaments and biological substances status: Secondary | ICD-10-CM | POA: Diagnosis not present

## 2022-02-04 DIAGNOSIS — I119 Hypertensive heart disease without heart failure: Secondary | ICD-10-CM | POA: Diagnosis present

## 2022-02-04 DIAGNOSIS — Z86718 Personal history of other venous thrombosis and embolism: Secondary | ICD-10-CM

## 2022-02-04 DIAGNOSIS — Z91018 Allergy to other foods: Secondary | ICD-10-CM | POA: Diagnosis not present

## 2022-02-04 DIAGNOSIS — E669 Obesity, unspecified: Secondary | ICD-10-CM | POA: Diagnosis present

## 2022-02-04 DIAGNOSIS — Z9049 Acquired absence of other specified parts of digestive tract: Secondary | ICD-10-CM

## 2022-02-04 DIAGNOSIS — Z720 Tobacco use: Secondary | ICD-10-CM | POA: Diagnosis not present

## 2022-02-04 DIAGNOSIS — I10 Essential (primary) hypertension: Secondary | ICD-10-CM

## 2022-02-04 DIAGNOSIS — Z881 Allergy status to other antibiotic agents status: Secondary | ICD-10-CM | POA: Diagnosis not present

## 2022-02-04 DIAGNOSIS — G4730 Sleep apnea, unspecified: Secondary | ICD-10-CM | POA: Diagnosis present

## 2022-02-04 DIAGNOSIS — Z6837 Body mass index (BMI) 37.0-37.9, adult: Secondary | ICD-10-CM | POA: Diagnosis not present

## 2022-02-04 DIAGNOSIS — Z20822 Contact with and (suspected) exposure to covid-19: Secondary | ICD-10-CM | POA: Diagnosis present

## 2022-02-04 LAB — CBC WITH DIFFERENTIAL/PLATELET
Abs Immature Granulocytes: 0.14 10*3/uL — ABNORMAL HIGH (ref 0.00–0.07)
Basophils Absolute: 0.1 10*3/uL (ref 0.0–0.1)
Basophils Relative: 1 %
Eosinophils Absolute: 0.3 10*3/uL (ref 0.0–0.5)
Eosinophils Relative: 2 %
HCT: 49.4 % (ref 39.0–52.0)
Hemoglobin: 16.7 g/dL (ref 13.0–17.0)
Immature Granulocytes: 1 %
Lymphocytes Relative: 13 %
Lymphs Abs: 1.7 10*3/uL (ref 0.7–4.0)
MCH: 29 pg (ref 26.0–34.0)
MCHC: 33.8 g/dL (ref 30.0–36.0)
MCV: 85.8 fL (ref 80.0–100.0)
Monocytes Absolute: 0.9 10*3/uL (ref 0.1–1.0)
Monocytes Relative: 7 %
Neutro Abs: 10.1 10*3/uL — ABNORMAL HIGH (ref 1.7–7.7)
Neutrophils Relative %: 76 %
Platelets: 238 10*3/uL (ref 150–400)
RBC: 5.76 MIL/uL (ref 4.22–5.81)
RDW: 14.6 % (ref 11.5–15.5)
WBC: 13.2 10*3/uL — ABNORMAL HIGH (ref 4.0–10.5)
nRBC: 0 % (ref 0.0–0.2)

## 2022-02-04 LAB — LACTIC ACID, PLASMA
Lactic Acid, Venous: 1.4 mmol/L (ref 0.5–1.9)
Lactic Acid, Venous: 0.9 mmol/L (ref 0.5–1.9)

## 2022-02-04 LAB — COMPREHENSIVE METABOLIC PANEL
ALT: 57 U/L — ABNORMAL HIGH (ref 0–44)
AST: 33 U/L (ref 15–41)
Albumin: 4.7 g/dL (ref 3.5–5.0)
Alkaline Phosphatase: 86 U/L (ref 38–126)
Anion gap: 9 (ref 5–15)
BUN: 22 mg/dL — ABNORMAL HIGH (ref 6–20)
CO2: 28 mmol/L (ref 22–32)
Calcium: 9.5 mg/dL (ref 8.9–10.3)
Chloride: 101 mmol/L (ref 98–111)
Creatinine, Ser: 2.22 mg/dL — ABNORMAL HIGH (ref 0.61–1.24)
GFR, Estimated: 37 mL/min — ABNORMAL LOW (ref >60)
Glucose, Bld: 102 mg/dL — ABNORMAL HIGH (ref 70–99)
Potassium: 4.5 mmol/L (ref 3.5–5.1)
Sodium: 138 mmol/L (ref 135–145)
Total Bilirubin: 1.1 mg/dL (ref 0.3–1.2)
Total Protein: 7.5 g/dL (ref 6.5–8.1)

## 2022-02-04 LAB — PROTIME-INR
INR: 1 (ref 0.8–1.2)
Prothrombin Time: 13.4 seconds (ref 11.4–15.2)

## 2022-02-04 LAB — TROPONIN I (HIGH SENSITIVITY)
Troponin I (High Sensitivity): 7 ng/L (ref <18)
Troponin I (High Sensitivity): 8 ng/L (ref <18)

## 2022-02-04 LAB — RESP PANEL BY RT-PCR (FLU A&B, COVID) ARPGX2
Influenza A by PCR: NEGATIVE
Influenza B by PCR: NEGATIVE
SARS Coronavirus 2 by RT PCR: NEGATIVE

## 2022-02-04 LAB — APTT: aPTT: 30 seconds (ref 24–36)

## 2022-02-04 LAB — BRAIN NATRIURETIC PEPTIDE: B Natriuretic Peptide: 13.6 pg/mL (ref 0.0–100.0)

## 2022-02-04 MED ORDER — ALPRAZOLAM 0.25 MG PO TABS: 0.2500 mg | ORAL_TABLET | Freq: Two times a day (BID) | ORAL | Status: DC | PRN

## 2022-02-04 MED ORDER — TRAZODONE HCL 50 MG PO TABS: 25.0000 mg | ORAL_TABLET | Freq: Every evening | ORAL | Status: DC | PRN

## 2022-02-04 MED ORDER — BUPROPION HCL ER (XL) 150 MG PO TB24: 150.0000 mg | ORAL_TABLET | Freq: Every day | ORAL | Status: DC

## 2022-02-04 MED ORDER — ONDANSETRON HCL 4 MG PO TABS: 4.0000 mg | ORAL_TABLET | Freq: Four times a day (QID) | ORAL | Status: DC | PRN

## 2022-02-04 MED ORDER — TRAMADOL HCL 50 MG PO TABS
50.0000 mg | ORAL_TABLET | Freq: Every day | ORAL | Status: DC | PRN
  Administered 2022-02-05: 50 mg via ORAL
  Filled 2022-02-04: qty 1

## 2022-02-04 MED ORDER — ONDANSETRON HCL 4 MG/2ML IJ SOLN
4.0000 mg | Freq: Once | INTRAMUSCULAR | Status: AC
  Administered 2022-02-04: 4 mg via INTRAVENOUS
  Filled 2022-02-04: qty 2

## 2022-02-04 MED ORDER — ACETAMINOPHEN 650 MG RE SUPP: 650.0000 mg | Freq: Four times a day (QID) | RECTAL | Status: DC | PRN

## 2022-02-04 MED ORDER — MORPHINE SULFATE (PF) 2 MG/ML IV SOLN
2.0000 mg | INTRAVENOUS | Status: DC | PRN
  Administered 2022-02-04: 2 mg via INTRAVENOUS
  Filled 2022-02-04: qty 1

## 2022-02-04 MED ORDER — ACETAMINOPHEN 325 MG PO TABS: 650.0000 mg | ORAL_TABLET | Freq: Four times a day (QID) | ORAL | Status: DC | PRN

## 2022-02-04 MED ORDER — GABAPENTIN 300 MG PO CAPS
300.0000 mg | ORAL_CAPSULE | Freq: Every day | ORAL | Status: DC
  Administered 2022-02-05: 300 mg via ORAL
  Filled 2022-02-04 (×2): qty 1

## 2022-02-04 MED ORDER — MAGNESIUM HYDROXIDE 400 MG/5ML PO SUSP: 30.0000 mL | Freq: Every day | ORAL | Status: DC | PRN

## 2022-02-04 MED ORDER — ONDANSETRON HCL 4 MG/2ML IJ SOLN
4.0000 mg | Freq: Four times a day (QID) | INTRAMUSCULAR | Status: DC | PRN
  Administered 2022-02-05: 4 mg via INTRAVENOUS
  Filled 2022-02-04 (×2): qty 2

## 2022-02-04 MED ORDER — DULOXETINE HCL 40 MG PO CPEP: 1.0000 | ORAL_CAPSULE | Freq: Every day | ORAL | Status: DC

## 2022-02-04 MED ORDER — IOHEXOL 350 MG/ML SOLN
75.0000 mL | Freq: Once | INTRAVENOUS | Status: AC | PRN
  Administered 2022-02-04: 75 mL via INTRAVENOUS

## 2022-02-04 MED ORDER — MORPHINE SULFATE (PF) 4 MG/ML IV SOLN
4.0000 mg | Freq: Once | INTRAVENOUS | Status: AC
  Administered 2022-02-04: 4 mg via INTRAVENOUS
  Filled 2022-02-04: qty 1

## 2022-02-04 MED ORDER — HEPARIN (PORCINE) 25000 UT/250ML-% IV SOLN
2050.0000 [IU]/h | INTRAVENOUS | Status: DC
  Administered 2022-02-04: 1750 [IU]/h via INTRAVENOUS
  Filled 2022-02-04 (×2): qty 250

## 2022-02-04 MED ORDER — HYDROMORPHONE HCL 1 MG/ML IJ SOLN
1.0000 mg | Freq: Once | INTRAMUSCULAR | Status: AC
  Administered 2022-02-04: 1 mg via INTRAVENOUS
  Filled 2022-02-04: qty 1

## 2022-02-04 MED ORDER — SODIUM CHLORIDE 0.9 % IV SOLN: INTRAVENOUS | Status: DC

## 2022-02-04 NOTE — Unmapped (Signed)
Spoke with [name] in regards to symptoms he reports via mychart message to Adam Keith, [place]. Per Adam Keith recommendation, it would be best for [name] to present to an urgent care or his PCP today for evaluation and work up if warranted based on symptoms mentioned in message. [name] prefers to be seen by his hem/onc team due to past history. Unfortunately, our team does not have availability to see [name] today to assess and would recommend the above plan. [name] did not tell me whether or not he was going to present to an urgent care or PCP.     Elicia Lamp, RN

## 2022-02-04 NOTE — Consult Note (Signed)
ANTICOAGULATION CONSULT NOTE - Follow Up Consult  Pharmacy Consult for heparin gtt Indication: pulmonary embolus  Allergies  Allergen Reactions   Pepto-Bismol [Bismuth] Nausea And Vomiting    Patient Measurements: Height: 6' 2.02" (188 cm) Weight: 131.7 kg (290 lb 5.5 oz) (entered from 01/21/2022 notes; please update for current admit) IBW/kg (Calculated) : 82.24 Heparin Dosing Weight: 111.5 kg  Vital Signs: Temp: 98.2 F (36.8 C) (11/03 2027) Temp Source: Oral (11/03 2027) BP: 143/89 (11/03 2027) Pulse Rate: 98 (11/03 2027)  Labs: Recent Labs    02/04/22 1748  HGB 16.7  HCT 49.4  PLT 238  LABPROT 13.4  INR 1.0  CREATININE 2.22*  TROPONINIHS 7   Estimated Creatinine Clearance: 62.5 mL/min (A) (by C-G formula based on SCr of 2.22 mg/dL (H)). Heparin Dosing Weight: 111.5 kg  Medications:  PTA: eliquis '5mg'$  BID (last dose 11/03 AM) Inpatient: +heparin gtt  Assessment: 41 yo M w/ h/o LL on chemotherapy, left lower extremity DVT on Eliquis who comes the ED complaining of acute onset of pleuritic chest pain in the right side of the chest and shortness of breath along with nonproductive cough. Pharmacy consulted for mgmt of heparin gtt ISO DOAC interaction PTA.  Date Time aPTT/HL Rate/Comment       Baseline Labs: aPTT - ordered; INR - 1 Hgb - 16.7; Plts - 238  Goal of Therapy:  Heparin level 0.3-0.7 units/ml aPTT 66-102 seconds Monitor platelets by anticoagulation protocol: Yes   Plan:  Last dose of Eliquis '5mg'$  on 11/03 in AM PTA. Per MD skip initial bolus d/t PTA DOAC; then start heparin infusion at 1750 units/hr Check aPTT/Anti-Xa level in 6 hours and daily once consecutively therapeutic.  Titrate by aPTT's until lab correlation is noted, then titrate by anti-xa alone. Continue to monitor H&H and platelets daily while on heparin gtt.   Brad Mcfarland 02/04/2022,8:44 PM

## 2022-02-04 NOTE — Assessment & Plan Note (Signed)
-   We will continue his antihypertensives. 

## 2022-02-04 NOTE — ED Notes (Signed)
Delay in collecting blood work due to pt being a difficult stick. Pt was stuck x3 for PIV. Twice by this RN and once by Terrence Dupont, Engineer, manufacturing.

## 2022-02-04 NOTE — ED Provider Triage Note (Signed)
Emergency Medicine Provider Triage Evaluation Note  Brad Mcfarland , a 41 y.o. male  was evaluated in triage.  Pt complains of shortness of breath.  Patient reports that he is on maintenance chemo for his known leukemia.  He has had a blood clot in his left lower extremity.  He reports that he has pain with deep inspiration and feels short of breath.  No fevers or chills.  He also reports dry cough.  Review of Systems  Positive: Shortness of breath, chest pain, leg swelling Negative: Fever  Physical Exam  There were no vitals taken for this visit. Gen:   Awake, no distress   Resp:  Able to speak in complete sentences, though does demonstrate slightly increased work of breathing by taking deep inhalations MSK:   Moves extremities without difficulty  Other:    Medical Decision Making  Medically screening exam initiated at 5:12 PM.  Appropriate orders placed.  Glenna Fellows was informed that the remainder of the evaluation will be completed by another provider, this initial triage assessment does not replace that evaluation, and the importance of remaining in the ED until their evaluation is complete.     Marquette Old, PA-C 02/04/22 1808

## 2022-02-04 NOTE — Assessment & Plan Note (Signed)
-   This is a small nonocclusive pulmonary embolus in the right upper lobe segmental branch with small thromboembolic burden. - The patient will be admitted to the progressive unit bed. - He will be placed on IV heparin and will hold off p.o. Eliquis. - Vascular surgery consult will be obtained. - I notified Dr. Gigi Gin about the patient. - Medical oncology consult will be obtained. - I notified Dr. Janese Banks about the patient.

## 2022-02-04 NOTE — ED Triage Notes (Signed)
Pt to ED via POV for shortness of breath. Pt states that he is having pain with breathing. Pt has hx/o ALL and is on chemo. Pt has cough, reports no fever. Pt has known blood clot in his left leg. Pt reports that his leg has been more swollen.

## 2022-02-04 NOTE — Assessment & Plan Note (Signed)
- 

## 2022-02-04 NOTE — ED Provider Notes (Signed)
[facility] Provider Note    Event Date/Time   First MD Initiated Contact with Patient 02/04/22 2014     (approximate)   History   Chief Complaint: Shortness of Breath   HPI  Brad Mcfarland is a 41 y.o. 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  No fever, no chills.  Does feel like his leg swelling has increased lately.  No trauma.  No dizziness or syncope.     Physical Exam   Triage Vital Signs: ED Triage Vitals [02/04/22 1716]  Enc Vitals Group     BP (!) 158/90     Pulse Rate (!) 106     Resp (!) 22     Temp 97.9 F (36.6 C)     Temp Source Oral     SpO2 91 %     Weight      Height      Head Circumference      Peak Flow      Pain Score 7     Pain Loc      Pain Edu?      Excl. in [place]?     Most recent vital signs: Vitals:   02/04/22 1900 02/04/22 2000  BP:    Pulse: (!) 101 95  Resp: 20 (!) 23  Temp:    SpO2: 99% 95%    General: Awake, mild distress due to pain.  CV:  Good peripheral perfusion.  Tachycardia, heart rate 105 Resp:  Normal effort.  Clear to auscultation bilaterally.  Mild tachypnea with a respiratory rate of 24 Abd:  No distention.  Soft nontender Other:  1+ pitting edema bilateral lower extremities, symmetric calf circumference.  No inflammatory skin changes.   ED Results / Procedures / Treatments   Labs (all labs ordered are listed, but only abnormal results are displayed) Labs Reviewed  CBC WITH DIFFERENTIAL/PLATELET - Abnormal; Notable for the following components:      Result Value   WBC 13.2 (*)    Neutro Abs 10.1 (*)    Abs Immature Granulocytes 0.14 (*)    All other components within normal limits  COMPREHENSIVE METABOLIC PANEL - Abnormal; Notable for the following components:   Glucose, Bld 102 (*)     BUN 22 (*)    Creatinine, Ser 2.22 (*)    ALT 57 (*)    GFR, Estimated 37 (*)    All other components within normal limits  RESP PANEL BY RT-PCR (FLU A&B, COVID) ARPGX2  CULTURE, BLOOD (ROUTINE X 2)  CULTURE, BLOOD (ROUTINE X 2)  BRAIN NATRIURETIC PEPTIDE  LACTIC ACID, PLASMA  PROTIME-INR  LACTIC ACID, PLASMA  URINALYSIS, ROUTINE W REFLEX MICROSCOPIC  HIV ANTIBODY (ROUTINE TESTING W REFLEX)  BASIC METABOLIC PANEL  CBC  TROPONIN I (HIGH SENSITIVITY)  TROPONIN I (HIGH SENSITIVITY)     EKG Interpreted by me Sinus tachycardia rate 104.  Right axis, normal intervals.  Normal QRS ST segments and T waves.  No ischemic changes.   RADIOLOGY Chest x-ray interpreted by me, appears unremarkable.  No edema consolidation or pleural effusion or pneumothorax.  Radiology report reviewed.  CT angiogram chest positive for right upper lobe segmental PE.  Discussed with radiologist.   PROCEDURES:  .Critical Care  Performed by: Carrie Mew, MD Authorized by:  Carrie Mew, MD   Critical care provider statement:    Critical care time (minutes):  35   Critical care time was exclusive of:  Separately billable procedures and treating other patients   Critical care was necessary to treat or prevent imminent or life-threatening deterioration of the following conditions:  Cardiac failure, circulatory failure and respiratory failure   Critical care was time spent personally by me on the following activities:  Development of treatment plan with patient or surrogate, discussions with consultants, evaluation of patient's response to treatment, examination of patient, obtaining history from patient or surrogate, ordering and performing treatments and interventions, ordering and review of laboratory studies, ordering and review of radiographic studies, pulse oximetry, re-evaluation of patient's condition and review of old charts   Care discussed with: admitting provider   Comments:        .1-3  Lead EKG Interpretation  Performed by: Carrie Mew, MD Authorized by: Carrie Mew, MD     Interpretation: abnormal     ECG rate:  105   ECG rate assessment: tachycardic     Rhythm: sinus tachycardia     Ectopy: none     Conduction: normal      MEDICATIONS ORDERED IN ED: Medications  0.9 %  sodium chloride infusion (has no administration in time range)  acetaminophen (TYLENOL) tablet 650 mg (has no administration in time range)    Or  acetaminophen (TYLENOL) suppository 650 mg (has no administration in time range)  traZODone (DESYREL) tablet 25 mg (has no administration in time range)  magnesium hydroxide (MILK OF MAGNESIA) suspension 30 mL (has no administration in time range)  ondansetron (ZOFRAN) tablet 4 mg (has no administration in time range)    Or  ondansetron (ZOFRAN) injection 4 mg (has no administration in time range)  iohexol (OMNIPAQUE) 350 MG/ML injection 75 mL (75 mLs Intravenous Contrast Given 02/04/22 1904)  morphine (PF) 4 MG/ML injection 4 mg (4 mg Intravenous Given 02/04/22 1933)  ondansetron (ZOFRAN) injection 4 mg (4 mg Intravenous Given 02/04/22 1933)     IMPRESSION / MDM / [place] / ED COURSE  I reviewed the triage vital signs and the nursing notes.                              Differential diagnosis includes, but is not limited to, pulmonary embolism, pneumonia, pneumothorax, pleural effusion, pulmonary edema, pleurisy  Patient's presentation is most consistent with acute presentation with potential threat to life or bodily function.  Patient presents with pleuritic chest pain, tachycardia, tachypnea, decreased oxygen level with medical history putting him at high risk for PE.  Will obtain CT angiogram of the chest and labs.  IV morphine and Zofran for initial symptom relief.   Clinical Course as of 02/04/22 2018  [facility] Feb 04, 2022  1801 Cxr unremarkable. Right hemidiaphragm somewhat indistinct, but no consolidation. Will proceed  with CT chest to eval for PE given high risk.  [PS]    Clinical Course User Index [PS] Carrie Mew, MD     ----------------------------------------- 8:41 PM on 02/04/2022 ----------------------------------------- CTA positive for PE.  Heparin ordered.  Case discussed with hospitalist.  Patient updated.  Additional pain control with 1 mg IV Dilaudid given, which "takes the edge off and makes it bearable" and does not cause any significant sedation.Marland Kitchen  FINAL CLINICAL IMPRESSION(S) / ED DIAGNOSES   Final diagnoses:  Other acute pulmonary embolism without acute cor pulmonale (HCC)  Rx / DC Orders   ED Discharge Orders     None        Note:  This document was prepared using Dragon voice recognition software and may include unintentional dictation errors.   Carrie Mew, MD 02/04/22 2042

## 2022-02-04 NOTE — Assessment & Plan Note (Signed)
-   We will hold off ponatinib for now as venous thromboembolism is one of its side effects pending oncology consult. - I notified Dr. Janese Banks about the patient as mentioned above. - Pain management will be provided.Marland Kitchen

## 2022-02-04 NOTE — H&P (Addendum)
[place]   PATIENT NAME: Brad Mcfarland    MR#:  [id number]  DATE OF BIRTH:  1981/03/17  DATE OF ADMISSION:  02/04/2022  PRIMARY CARE PHYSICIAN: Leonel Ramsay, MD   Patient is coming from: Home  REQUESTING/REFERRING PHYSICIAN: Brenton Grills, MD  CHIEF COMPLAINT:   Chief Complaint  Patient presents with   Shortness of Breath    HISTORY OF PRESENT ILLNESS:  Brad Mcfarland is a 41 y.o. male with medical history significant for acute lymphoblastic leukemia, on active chemotherapy, who presented to the emergency room with acute onset of worsening dyspnea with associated dry cough and wheezing as well as chest pain that is described as sharp pain worsening with cough or deep breathing, over the last couple of days.  He was diagnosed with a lower extremity DVT a couple months ago and has been taking Eliquis twice daily on a regular basis since then.  He denied any fever or chills.  No nausea or vomiting or abdominal pain.  No bleeding diathesis.  No dysuria, oliguria or hematuria or flank pain.  No headache or dizziness or blurred vision.  ED Course: When he came to the ER, BP was 158/90 with heart rate of 106 and respiratory rate of 22 pulse oximetry of 91% on room air and 94-97% on 4 L of O2 by nasal cannula..  Labs revealed a BUN of 22 with a creatinine of 2.22 and ALT of 57 with otherwise unremarkable CMP.  High sensitive troponin I was 7 and lactic acid 1.4.  CBC showed leukocytosis of 13.2 with neutrophilia.  INR is 1 and PT 13.1.  Influenza antigens and COVID-19 PCR came back negative.  Blood cultures were drawn. EKG as reviewed by me : EKG showed sinus tachycardia with rate 104 with biatrial enlargement and right axis deviation. Imaging: Two-view chest x-ray showed the following: 1. Interval probable pneumonia in the right upper lung zone with loculated pleural fluid laterally. If the patient does not have clinical findings of infection, a chest CT with contrast  would be recommended to exclude malignancy. If he does have clinical signs of infection, follow-up PA and lateral chest radiographs would be recommended in 3-4 weeks following trial of antibiotic therapy to ensure resolution and exclude underlying malignancy. 2. Additional smaller loculations of pleural fluid laterally in the right lower lung zone.  Chest CTA revealed the following: 1. Small nonocclusive pulmonary embolus in the right upper lobe segmental branch. Thromboembolic burden is small. 2. Volume loss in the right hemithorax with opacity in the right upper lobe extending to the apex. This has been reported on prior outside imaging which is not available for review. Stability/acuity cannot be determined. 3. Few prominent/borderline mediastinal nodes measuring up to 11 mm. Comparison with prior imaging is recommended to assess for stability, given leukemia history. 4. Left ventricular myocardial calcifications, chronic. 5.  Emphysema.  The patient was placed on IV heparin with bolus and drip.  He will be admitted to a progressive unit bed for further evaluation and management.The course was complicated by septic shock, candida krusei fungemia, MRSA bacteremia with septic emboli c/b acute renal failure and respiratory distress requiring dialysis and intubation. 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 He subsequently had difficulty tolerating single agent dasatinib (as a bridge to planned ponatinib-blinatumomab--had fluid retention and pleural effusion). He then completed 4 cycles of ponatinib-blinatumomab, initiated for treatment of MRD in the setting of poor tolerance of standard therapy.  Patient is currently on ponatinib 30 mg monotherapy (since 01/2021) with most recent BCR-ABL level of 0.002% in the blood (12/02/21). He has decided against transplant.   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  PAST SURGICAL HISTORY:   Past Surgical History:  Procedure Laterality Date   APPENDECTOMY     KNEE ARTHROSCOPY Left 65/78/4696   UMBILICAL HERNIA REPAIR N/A 04/11/2018   Procedure: HERNIA REPAIR UMBILICAL WITH MESH;  Surgeon: Benjamine Sprague, DO;  Location: [facility] ORS;  Service: General;  Laterality: N/A;   WISDOM TOOTH EXTRACTION      SOCIAL HISTORY:   Social History   Tobacco Use   Smoking status: Heavy Smoker    Packs/day: 2.00    Types: Cigarettes   Smokeless tobacco: Never  Substance Use Topics   Alcohol use: Yes    FAMILY HISTORY:  Positive for cancer in his mother.  DRUG ALLERGIES:   Allergies  Allergen Reactions   Bupropion Other (See Comments)    Per patient "out of touch with reality, suicidal, homicidal"   Cefepime Rash    DRESS   Ceftaroline Other (See Comments) and Rash    Rash X 2 02/2020, suspected DRESS 06/2020   Dapsone Anaphylaxis and Other (See Comments)    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   Onion Anaphylaxis   Vancomycin Other (See Comments)    Hearing loss    lasix    Hearing loss with Lasix Other reaction(s): Other (See Comments) Hearing loss lasix  Hearing loss with Lasix    Other reaction(s): Other (See Comments)    Hearing loss    lasix    Hearing loss  lasix  Hearing loss with Lasix Other reaction(s): Other (See Comments) Hearing loss lasix   Furosemide Other (See Comments)    With Vancomycin caused hearing loss    With Vancomycin caused hearing loss Other reaction(s): Other (See Comments) With Vancomycin caused hearing loss  With Vancomycin caused hearing loss    Other reaction(s): Other (See Comments)    With Vancomycin caused hearing loss   With Vancomycin caused hearing loss Other reaction(s): Other (See Comments) With Vancomycin caused hearing loss   Immune Globulin Other (See Comments)   Pepto-Bismol [Bismuth] Nausea And Vomiting    REVIEW OF SYSTEMS:   ROS As per history of present illness. All pertinent systems were reviewed above. Constitutional, HEENT, cardiovascular, respiratory, GI, GU, musculoskeletal, neuro, psychiatric, endocrine, integumentary and hematologic systems were reviewed and are otherwise negative/unremarkable except for positive findings mentioned above in the HPI.   MEDICATIONS AT HOME:   Prior to Admission medications   Medication Sig Start Date End Date Taking? Authorizing Provider  ALPRAZolam (XANAX) 0.25 MG tablet Take 0.25 mg by mouth 2 (two) times daily as needed.  01/09/20   [provider]  buPROPion (WELLBUTRIN XL) 150 MG 24 hr tablet Take 150 mg by mouth daily. 10/30/19   [provider]  DULoxetine HCl 40 MG CPEP Take 1 capsule by mouth daily. 01/14/20   [provider]  gabapentin (NEURONTIN) 300 MG capsule Take 300 mg by mouth at bedtime. 10/15/19   [provider]  HYDROcodone-acetaminophen (NORCO/VICODIN) 5-325 MG tablet Take 1 tablet by mouth every 6 (six) hours as needed for moderate pain. Patient not taking: Reported on 01/20/2020 04/24/19   Versie Starks, PA-C  predniSONE (STERAPRED UNI-PAK 48 TAB) 10 MG (48) TBPK tablet Take 6 pills for 2 days, 5 pills x 2d, 4 pills x 2d, 3 pills x 2d, 2 pills x 2d, 1 pill x 2d Patient not taking: Reported on 01/20/2020 04/24/19   Versie Starks, PA-C  traMADol (ULTRAM) 50 MG tablet Take 50 mg by mouth daily as needed for moderate pain.  12/23/19   [provider]      VITAL SIGNS:  Blood pressure (!) 166/97, pulse 96, temperature 98.2 F (36.8 C), temperature source Oral, resp. rate 18, height 6' 2.02" (1.88 m), weight 131.7 kg, SpO2 97 %.  PHYSICAL EXAMINATION:  Physical Exam  GENERAL:  41  y.o.-year-old Caucasian male patient lying in the bed with no acute distress.  EYES: Pupils equal, round, reactive to light and accommodation. No scleral icterus. Extraocular muscles intact.  HEENT: Head atraumatic, normocephalic. Oropharynx and nasopharynx clear.  NECK:  Supple, no jugular venous distention. No thyroid enlargement, no tenderness.  LUNGS: Normal breath sounds bilaterally, no wheezing, rales,rhonchi or crepitation. No use of accessory muscles of respiration.  CARDIOVASCULAR: Regular rate and rhythm, S1, S2 normal. No murmurs, rubs, or gallops.  ABDOMEN: Soft, nondistended, nontender. Bowel sounds present. No organomegaly or mass.  EXTREMITIES: Trace to 1+ bilateral lower extremity pitting edema no cyanosis, or clubbing.  NEUROLOGIC: Cranial nerves II through XII are intact. Muscle strength 5/5 in all extremities. Sensation intact. Gait not checked.  PSYCHIATRIC: The patient is alert and oriented x 3.  Normal affect and good eye contact. SKIN: No obvious rash, lesion, or ulcer.   LABORATORY PANEL:   CBC Recent Labs  Lab 02/04/22 1748  WBC 13.2*  HGB 16.7  HCT 49.4  PLT 238   ------------------------------------------------------------------------------------------------------------------  Chemistries  Recent Labs  Lab 02/04/22 1748  NA 138  K 4.5  CL 101  CO2 28  GLUCOSE 102*  BUN 22*  CREATININE 2.22*  CALCIUM 9.5  AST 33  ALT 57*  ALKPHOS 86  BILITOT 1.1   ------------------------------------------------------------------------------------------------------------------  Cardiac Enzymes No results for input(s): "TROPONINI" in the last 168 hours. ------------------------------------------------------------------------------------------------------------------  RADIOLOGY:  CT Angio Chest PE W and/or Wo Contrast  Result Date: 02/04/2022 CLINICAL DATA:  Pulmonary embolism (PE) suspected, high prob cancer, hypoxia, tachcyardia Shortness of breath.  Chemotherapy for leukemia. EXAM: CT ANGIOGRAPHY CHEST WITH CONTRAST TECHNIQUE: Multidetector CT imaging of the chest was performed using the standard protocol during bolus administration of intravenous contrast. Multiplanar CT image reconstructions and MIPs were obtained to evaluate the vascular anatomy. RADIATION DOSE REDUCTION: This exam was performed according to the departmental dose-optimization program which includes automated exposure control, adjustment of the mA and/or kV according to patient size and/or use of iterative reconstruction technique. CONTRAST:  63m OMNIPAQUE IOHEXOL 350 MG/ML SOLN COMPARISON:  Radiograph earlier today. Report from chest CT 05/17/2021 performed at an outside institution. Please note patient has had multiple prior outside exams that are not available for review. FINDINGS: Cardiovascular: Small nonocclusive pulmonary embolus in the upper lobe segmental branch. Thromboembolic burden is small. No other pulmonary arterial filling defects. The heart is normal in size. Extensive left ventricular myocardial calcifications. No pericardial effusion. Mediastinum/Nodes: Right paratracheal node measures 11 mm, series 4 image 38. There is an upper paratracheal node measuring 9 mm series 4 image  23. No enlarged hilar nodes. No esophageal wall thickening. There is no visible thyroid nodule Lungs/Pleura: There is volume loss in the right hemithorax with opacity in the right upper lobe extending to the apex. Suspected chronic areas of bronchiolectasis. Subsegmental opacities in the right middle lobe. There is background emphysema with bronchial thickening. No pleural effusion. Upper Abdomen: Suspected hepatic steatosis. No acute upper abdominal findings. There is no upper abdominal adenopathy. Musculoskeletal: There are no acute or suspicious osseous abnormalities. Review of the MIP images confirms the above findings. IMPRESSION: 1. Small nonocclusive pulmonary embolus in the right upper lobe  segmental branch. Thromboembolic burden is small. 2. Volume loss in the right hemithorax with opacity in the right upper lobe extending to the apex. This has been reported on prior outside imaging which is not available for review. Stability/acuity cannot be determined. 3. Few prominent/borderline mediastinal nodes measuring up to 11 mm. Comparison with prior imaging is recommended to assess for stability, given leukemia history. 4. Left ventricular myocardial calcifications, chronic. Emphysema (ICD10-J43.9). Critical Value/emergent results were called by telephone at the time of interpretation on 02/04/2022 at 7:40 pm to provider Joni Fears , who verbally acknowledged these results. Electronically Signed   By: Keith Rake M.D.   On: 02/04/2022 19:43   DG Chest 2 View  Result Date: 02/04/2022 CLINICAL DATA:  Chest pain, shortness of breath and cough. No fever. EXAM: CHEST - 2 VIEW COMPARISON:  01/20/2020 FINDINGS: Normal sized heart. Interval patchy density in the right upper lung zone with loculated pleural fluid laterally. Additional smaller loculations of pleural fluid laterally in the right lower lung zone. Clear left lung. Unremarkable bones. IMPRESSION: 1. Interval probable pneumonia in the right upper lung zone with loculated pleural fluid laterally. If the patient does not have clinical findings of infection, a chest CT with contrast would be recommended to exclude malignancy. If he does have clinical signs of infection, follow-up PA and lateral chest radiographs would be recommended in 3-4 weeks following trial of antibiotic therapy to ensure resolution and exclude underlying malignancy. 2. Additional smaller loculations of pleural fluid laterally in the right lower lung zone. Electronically Signed   By: Claudie Revering M.D.   On: 02/04/2022 18:09      IMPRESSION AND PLAN:  Assessment and Plan: * Acute pulmonary embolism ([place]) - This is a small nonocclusive pulmonary embolus in the right upper  lobe segmental branch with small thromboembolic burden. - The patient will be admitted to the progressive unit bed. - He will be placed on IV heparin and will hold off p.o. Eliquis. - Vascular surgery consult will be obtained. - I notified Dr. Gigi Gin about the patient. - Medical oncology consult will be obtained. - I notified Dr. Janese Banks about the patient.  Acute lymphoblastic leukemia in remission ([place]) - We will hold off ponatinib for now as venous thromboembolism is one of its side effects pending oncology consult. - I notified Dr. Janese Banks about the patient as mentioned above. - Pain management will be provided..  Essential hypertension - We will continue his antihypertensives.  Tobacco abuse Will be counseled for smoking cessation.    DVT prophylaxis: IV heparin Advanced Care Planning:  Code Status: full code. Family Communication:  The plan of care was discussed in details with the patient (and family). I answered all questions. The patient agreed to proceed with the above mentioned plan. Further management will depend upon hospital course. Disposition Plan: Back to previous home environment Consults called: Vascular surgery and hematology/oncology. All  the records are reviewed and case discussed with ED provider.  Status is: Inpatient   At the time of the admission, it appears that the appropriate admission status for this patient is inpatient.  This is judged to be reasonable and necessary in order to provide the required intensity of service to ensure the patient's safety given the presenting symptoms, physical exam findings and initial radiographic and laboratory data in the context of comorbid conditions.  The patient requires inpatient status due to high intensity of service, high risk of further deterioration and high frequency of surveillance required.  I certify that at the time of admission, it is my clinical judgment that the patient will require inpatient hospital care  extending more than 2 midnights.                            Dispo: The patient is from: Home              Anticipated d/c is to: Home              Patient currently is not medically stable to d/c.              Difficult to place patient: No  Christel Mormon M.D on 02/04/2022 at 11:40 PM  [facility]   From 7 PM-7 AM, contact night-coverage [email or web address]  CC: Primary care physician; Leonel Ramsay, MD

## 2022-02-05 DIAGNOSIS — I2699 Other pulmonary embolism without acute cor pulmonale: Secondary | ICD-10-CM | POA: Diagnosis not present

## 2022-02-05 LAB — URINALYSIS, ROUTINE W REFLEX MICROSCOPIC
Bacteria, UA: NONE SEEN
Bilirubin Urine: NEGATIVE
Glucose, UA: NEGATIVE mg/dL
Ketones, ur: NEGATIVE mg/dL
Leukocytes,Ua: NEGATIVE
Nitrite: NEGATIVE
Protein, ur: 30 mg/dL — AB
Specific Gravity, Urine: 1.024 (ref 1.005–1.030)
Squamous Epithelial / HPF: NONE SEEN (ref 0–5)
pH: 6 (ref 5.0–8.0)

## 2022-02-05 LAB — BASIC METABOLIC PANEL
Anion gap: 8 (ref 5–15)
BUN: 23 mg/dL — ABNORMAL HIGH (ref 6–20)
CO2: 27 mmol/L (ref 22–32)
Calcium: 8.9 mg/dL (ref 8.9–10.3)
Chloride: 102 mmol/L (ref 98–111)
Creatinine, Ser: 1.95 mg/dL — ABNORMAL HIGH (ref 0.61–1.24)
GFR, Estimated: 43 mL/min — ABNORMAL LOW (ref >60)
Glucose, Bld: 117 mg/dL — ABNORMAL HIGH (ref 70–99)
Potassium: 4.3 mmol/L (ref 3.5–5.1)
Sodium: 137 mmol/L (ref 135–145)

## 2022-02-05 LAB — CBC
HCT: 47.8 % (ref 39.0–52.0)
Hemoglobin: 16.4 g/dL (ref 13.0–17.0)
MCH: 29.5 pg (ref 26.0–34.0)
MCHC: 34.3 g/dL (ref 30.0–36.0)
MCV: 86.1 fL (ref 80.0–100.0)
Platelets: 200 10*3/uL (ref 150–400)
RBC: 5.55 MIL/uL (ref 4.22–5.81)
RDW: 14.6 % (ref 11.5–15.5)
WBC: 11.3 10*3/uL — ABNORMAL HIGH (ref 4.0–10.5)
nRBC: 0 % (ref 0.0–0.2)

## 2022-02-05 LAB — HEPARIN LEVEL (UNFRACTIONATED): Heparin Unfractionated: >1.1 IU/mL — ABNORMAL HIGH (ref 0.30–0.70)

## 2022-02-05 LAB — APTT: aPTT: 50 seconds — ABNORMAL HIGH (ref 24–36)

## 2022-02-05 LAB — HIV ANTIBODY (ROUTINE TESTING W REFLEX): HIV Screen 4th Generation wRfx: NONREACTIVE

## 2022-02-05 MED ORDER — CARVEDILOL 12.5 MG PO TABS: 12.5000 mg | ORAL_TABLET | Freq: Two times a day (BID) | ORAL | Status: DC

## 2022-02-05 MED ORDER — NORTRIPTYLINE HCL 25 MG PO CAPS
50.0000 mg | ORAL_CAPSULE | Freq: Two times a day (BID) | ORAL | Status: DC
  Administered 2022-02-05: 50 mg via ORAL
  Filled 2022-02-05: qty 2

## 2022-02-05 MED ORDER — HYDROMORPHONE HCL 1 MG/ML IJ SOLN
1.0000 mg | INTRAMUSCULAR | Status: DC | PRN
  Administered 2022-02-05 (×3): 1 mg via INTRAVENOUS
  Filled 2022-02-05 (×3): qty 1

## 2022-02-05 MED ORDER — AMLODIPINE BESYLATE 10 MG PO TABS
10.0000 mg | ORAL_TABLET | Freq: Every day | ORAL | Status: DC
  Administered 2022-02-05: 10 mg via ORAL
  Filled 2022-02-05: qty 1

## 2022-02-05 MED ORDER — OXYCODONE HCL ER 10 MG PO T12A: 10.0000 mg | EXTENDED_RELEASE_TABLET | Freq: Every day | ORAL | Status: DC

## 2022-02-05 MED ORDER — IPRATROPIUM-ALBUTEROL 0.5-2.5 (3) MG/3ML IN SOLN
3.0000 mL | RESPIRATORY_TRACT | Status: DC | PRN
  Administered 2022-02-05: 3 mL via RESPIRATORY_TRACT
  Filled 2022-02-05 (×2): qty 3

## 2022-02-05 MED ORDER — ENOXAPARIN SODIUM 150 MG/ML IJ SOSY
1.0000 mg/kg | PREFILLED_SYRINGE | Freq: Two times a day (BID) | INTRAMUSCULAR | Status: DC
  Administered 2022-02-05: 132 mg via SUBCUTANEOUS
  Filled 2022-02-05: qty 0.88

## 2022-02-05 MED ORDER — PONATINIB HCL 30 MG PO TABS: 30.0000 mg | ORAL_TABLET | Freq: Every day | ORAL | Status: DC

## 2022-02-05 MED ORDER — PANTOPRAZOLE SODIUM 40 MG PO TBEC
40.0000 mg | DELAYED_RELEASE_TABLET | Freq: Every day | ORAL | Status: DC
  Administered 2022-02-05: 40 mg via ORAL
  Filled 2022-02-05: qty 1

## 2022-02-05 MED ORDER — VALACYCLOVIR HCL 500 MG PO TABS
500.0000 mg | ORAL_TABLET | Freq: Every day | ORAL | Status: DC
  Administered 2022-02-05: 500 mg via ORAL
  Filled 2022-02-05: qty 1

## 2022-02-05 MED ORDER — SULFAMETHOXAZOLE-TRIMETHOPRIM 800-160 MG PO TABS
1.0000 | ORAL_TABLET | ORAL | Status: DC
  Administered 2022-02-05: 1 via ORAL
  Filled 2022-02-05: qty 1

## 2022-02-05 MED ORDER — HEPARIN BOLUS VIA INFUSION
3300.0000 [IU] | Freq: Once | INTRAVENOUS | Status: AC
  Administered 2022-02-05: 3300 [IU] via INTRAVENOUS
  Filled 2022-02-05: qty 3300

## 2022-02-05 MED ORDER — ENOXAPARIN SODIUM 150 MG/ML IJ SOSY: 1.0000 mg/kg | PREFILLED_SYRINGE | Freq: Two times a day (BID) | INTRAMUSCULAR | 1 refills | Status: AC

## 2022-02-05 NOTE — Progress Notes (Signed)
Pt ambulated in room with and without oxygen. Patient maintained 97 oxygen with and without while ambulating in room to bathroom. Pt did not get SOB or dyspneic. Louanne Skye 02/05/22 11:51 AM

## 2022-02-05 NOTE — Consult Note (Signed)
ANTICOAGULATION CONSULT NOTE - Follow Up Consult  Pharmacy Consult for heparin gtt Indication: pulmonary embolus  Allergies  Allergen Reactions   Bupropion Other (See Comments)    Per patient "out of touch with reality, suicidal, homicidal"   Cefepime Rash    DRESS   Ceftaroline Other (See Comments) and Rash    Rash X 2 02/2020, suspected DRESS 06/2020   Dapsone Anaphylaxis and Other (See Comments)    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   Onion Anaphylaxis   Vancomycin Other (See Comments)    Hearing loss    lasix    Hearing loss with Lasix Other reaction(s): Other (See Comments) Hearing loss lasix  Hearing loss with Lasix    Other reaction(s): Other (See Comments)    Hearing loss    lasix    Hearing loss  lasix  Hearing loss with Lasix Other reaction(s): Other (See Comments) Hearing loss lasix   Furosemide Other (See Comments)    With Vancomycin caused hearing loss    With Vancomycin caused hearing loss Other reaction(s): Other (See Comments) With Vancomycin caused hearing loss  With Vancomycin caused hearing loss    Other reaction(s): Other (See Comments)    With Vancomycin caused hearing loss  With Vancomycin caused hearing loss Other reaction(s): Other (See Comments) With Vancomycin caused hearing loss   Immune Globulin Other (See Comments)   Pepto-Bismol [Bismuth] Nausea And Vomiting    Patient Measurements: Height: 6' 2.02" (188 cm) Weight: 131.7 kg (290 lb 5.5 oz) (entered from 01/21/2022 notes; please update for current admit) IBW/kg (Calculated) : 82.24 Heparin Dosing Weight: 111.5 kg  Vital Signs: Temp: 97.8 F (36.6 C) (11/04 0436) Temp Source: Oral (11/04 0018) BP: 149/80 (11/04 0436) Pulse Rate: 99 (11/04 0436)  Labs: Recent Labs    02/04/22 1748 02/04/22 2048 02/04/22 2115 02/05/22 0452  HGB 16.7  --   --  16.4  HCT 49.4  --   --  47.8  PLT 238  --   --  200  APTT  --   --  30 50*  LABPROT 13.4  --   --   --   INR 1.0  --   --   --    HEPARINUNFRC  --   --   --  >1.10*  CREATININE 2.22*  --   --  1.95*  TROPONINIHS 7 8  --   --     Estimated Creatinine Clearance: 71.2 mL/min (A) (by C-G formula based on SCr of 1.95 mg/dL (H)). Heparin Dosing Weight: 111.5 kg  Medications:  PTA: eliquis '5mg'$  BID (last dose 11/03 AM) Inpatient: +heparin gtt  Assessment: 41 yo M w/ h/o LL on chemotherapy, left lower extremity DVT on Eliquis who comes the ED complaining of acute onset of pleuritic chest pain in the right side of the chest and shortness of breath along with nonproductive cough. Pharmacy consulted for mgmt of heparin gtt ISO DOAC interaction PTA.  Date Time aPTT/HL Rate/Comment 11/04 0452 50 / >1.1 Subtherapeutic      Baseline Labs: aPTT - ordered; INR - 1 Hgb - 16.7; Plts - 238  Goal of Therapy:  Heparin level 0.3-0.7 units/ml aPTT 66-102 seconds Monitor platelets by anticoagulation protocol: Yes   Plan:  Bolus 3300 units x 1 Increase heparin infusion rate to 2050 Recheck aPTT in 6 hrs after rate change HL and CBC daily while on heparin.  Renda Rolls, PharmD, [facility] 02/05/2022 6:25 AM

## 2022-02-05 NOTE — Consult Note (Signed)
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   Onion Anaphylaxis   Vancomycin Other (See Comments)    Hearing loss    lasix    Hearing loss with Lasix Other reaction(s): Other (See Comments) Hearing loss lasix  Hearing loss with Lasix    Other reaction(s): Other (See Comments)    Hearing loss    lasix    Hearing loss  lasix  Hearing loss with Lasix Other reaction(s): Other (See Comments) Hearing loss lasix   Furosemide Other (See Comments)    With Vancomycin caused hearing loss    With Vancomycin caused hearing loss Other reaction(s): Other (See Comments) With Vancomycin caused hearing loss  With Vancomycin caused hearing loss    Other reaction(s): Other (See Comments)    With Vancomycin caused hearing loss  With Vancomycin caused hearing loss Other reaction(s): Other (See Comments) With Vancomycin caused hearing loss   Immune Globulin Other (See Comments)   Pepto-Bismol [Bismuth] Nausea And Vomiting    Patient Measurements: Height: 6' 2.02" (188 cm) Weight: 131.7 kg (290 lb 5.5 oz) (entered from 01/21/2022 notes; please update for current admit) IBW/kg (Calculated) : 82.24 Heparin Dosing Weight: 111.5 kg  Vital Signs: Temp: 97.6 F (36.4 C) (11/04 1113) Temp Source: Oral (11/04 0018) BP: 143/75 (11/04 1113) Pulse Rate: 86 (11/04 1113)  Labs: Recent Labs    02/04/22 1748 02/04/22 2048 02/04/22 2115 02/05/22 0452  HGB 16.7  --   --  16.4  HCT 49.4  --   --  47.8  PLT 238  --   --  200  APTT  --   --  30 50*  LABPROT 13.4  --   --   --   INR 1.0  --   --   --   HEPARINUNFRC  --   --    --  >1.10*  CREATININE 2.22*  --   --  1.95*  TROPONINIHS 7 8  --   --     Estimated Creatinine Clearance: 71.2 mL/min (A) (by C-G formula based on SCr of 1.95 mg/dL (H)). Heparin Dosing Weight: 111.5 kg  Medications:  PTA: eliquis '5mg'$  BID (last dose 11/03 AM)  Assessment: 40 yo M w/ h/o LL on chemotherapy, left lower extremity DVT on Eliquis who comes the ED complaining of acute onset of pleuritic chest pain in the right side of the chest and shortness of breath along with nonproductive cough. Pharmacy consulted for mgmt of heparin gtt ISO DOAC interaction PTA now being transitioned to enoxaparin.  Goal of Therapy:  Monitor platelets by anticoagulation protocol: Yes   Plan:  ---stop heparin infusion ---start enoxaparin 1 mg/kg subcutaneously twice daily ---HL and CBC at least every 72 hours per protocol  Vallery Sa, PharmD, BCPS 02/05/2022 11:14 AM

## 2022-02-05 NOTE — Plan of Care (Signed)

## 2022-02-05 NOTE — Hospital Course (Addendum)
Taken from H&P.  Brad Mcfarland is a 41 y.o. male with medical history significant for acute lymphoblastic leukemia, on active chemotherapy, who presented to the emergency room with acute onset of worsening dyspnea with associated dry cough and wheezing as well as chest pain that is described as sharp pain worsening with cough or deep breathing, over the last couple of days.  He was diagnosed with a lower extremity DVT a couple months ago and has been taking Eliquis twice daily on a regular basis since then.   ED Course: When he came to the ER, BP was 158/90 with heart rate of 106 and respiratory rate of 22 pulse oximetry of 91% on room air and 94-97% on 4 L of O2 by nasal cannula..  Labs revealed a BUN of 22 with a creatinine of 2.22 and ALT of 57 with otherwise unremarkable CMP.  High sensitive troponin I was 7 and lactic acid 1.4.  CBC showed leukocytosis of 13.2 with neutrophilia.  INR is 1 and PT 13.1.  Influenza antigens and COVID-19 PCR came back negative.  Blood cultures were drawn. EKG as reviewed by me : EKG showed sinus tachycardia with rate 104 with biatrial enlargement and right axis deviation. Imaging: Two-view chest x-ray showed the following: 1. Interval probable pneumonia in the right upper lung zone with loculated pleural fluid laterally. If the patient does not have clinical findings of infection, a chest CT with contrast would be recommended to exclude malignancy. If he does have clinical signs of infection, follow-up PA and lateral chest radiographs would be recommended in 3-4 weeks following trial of antibiotic therapy to ensure resolution and exclude underlying malignancy. 2. Additional smaller loculations of pleural fluid laterally in the right lower lung zone.   Chest CTA revealed the following: 1. Small nonocclusive pulmonary embolus in the right upper lobe segmental branch. Thromboembolic burden is small. 2. Volume loss in the right hemithorax with opacity in the  right upper lobe extending to the apex. This has been reported on prior outside imaging which is not available for review. Stability/acuity cannot be determined. 3. Few prominent/borderline mediastinal nodes measuring up to 11 mm. Comparison with prior imaging is recommended to assess for stability, given leukemia history. 4. Left ventricular myocardial calcifications, chronic. 5.  Emphysema.  11/4: CBC with some improvement in leukocytosis to 13, Creatinine improving, at 1.95 with baseline of 1.6-1.7 which makes it CKD stage IIIa.  Discussed with Dr. Janese Banks from oncology and she was recommending continuing home Ponatinib, start him on Lovenox and he need to follow-up with his oncologist at [facility].  No significant recorded hypoxia recorded.  Patient was able to ambulate without any hypoxia and maintaining saturation in high 90s.  Discussed with vascular surgery regarding placement of IVC as patient developed PE with an active cancer and being on Eliquis for DVT.  They were suggesting Lovenox and follow-up with his oncologist for further recommendations.  They will defer placement of IVC at this time.  Patient was started on Lovenox injections, heparin infusion was discontinued and he was asked to stop taking Eliquis and have a close follow-up with his oncologist for further recommendations.  Patient remained stable and is being discharged home to continue his home medications, Eliquis was discontinued and he was started on Lovenox and need to have a close follow-up with his oncologist for further recommendations.

## 2022-02-05 NOTE — Discharge Summary (Signed)
Physician Discharge Summary   Patient: Brad Mcfarland MRN: [id number] DOB: [birth year]  Admit date:     02/04/2022  Discharge date: 02/05/22  Discharge Physician: Lorella Nimrod   PCP: Leonel Ramsay, MD   Recommendations at discharge:  Please obtain CBC and BMP in 1 week Follow-up with oncology within a week Follow-up with primary care provider  Discharge Diagnoses: Principal Problem:   Acute pulmonary embolism [facility]) Active Problems:   Acute lymphoblastic leukemia in remission [facility])   Essential hypertension   Tobacco abuse   Acute respiratory failure with hypoxia [facility])   Hospital Course: Taken from H&P.  Brad Mcfarland is a 41 y.o. male with medical history significant for acute lymphoblastic leukemia, on active chemotherapy, who presented to the emergency room with acute onset of worsening dyspnea with associated dry cough and wheezing as well as chest pain that is described as sharp pain worsening with cough or deep breathing, over the last couple of days.  He was diagnosed with a lower extremity DVT a couple months ago and has been taking Eliquis twice daily on a regular basis since then.   ED Course: When he came to the ER, BP was 158/90 with heart rate of 106 and respiratory rate of 22 pulse oximetry of 91% on room air and 94-97% on 4 L of O2 by nasal cannula..  Labs revealed a BUN of 22 with a creatinine of 2.22 and ALT of 57 with otherwise unremarkable CMP.  High sensitive troponin I was 7 and lactic acid 1.4.  CBC showed leukocytosis of 13.2 with neutrophilia.  INR is 1 and PT 13.1.  Influenza antigens and COVID-19 PCR came back negative.  Blood cultures were drawn. EKG as reviewed by me : EKG showed sinus tachycardia with rate 104 with biatrial enlargement and right axis deviation. Imaging: Two-view chest x-ray showed the following: 1. Interval probable pneumonia in the right upper lung zone with loculated pleural fluid laterally. If the patient does not  have clinical findings of infection, a chest CT with contrast would be recommended to exclude malignancy. If he does have clinical signs of infection, follow-up PA and lateral chest radiographs would be recommended in 3-4 weeks following trial of antibiotic therapy to ensure resolution and exclude underlying malignancy. 2. Additional smaller loculations of pleural fluid laterally in the right lower lung zone.   Chest CTA revealed the following: 1. Small nonocclusive pulmonary embolus in the right upper lobe segmental branch. Thromboembolic burden is small. 2. Volume loss in the right hemithorax with opacity in the right upper lobe extending to the apex. This has been reported on prior outside imaging which is not available for review. Stability/acuity cannot be determined. 3. Few prominent/borderline mediastinal nodes measuring up to 11 mm. Comparison with prior imaging is recommended to assess for stability, given leukemia history. 4. Left ventricular myocardial calcifications, chronic. 5.  Emphysema.  11/4: CBC with some improvement in leukocytosis to 13, Creatinine improving, at 1.95 with baseline of 1.6-1.7 which makes it CKD stage IIIa.  Discussed with Dr. Janese Banks from oncology and she was recommending continuing home Ponatinib, start him on Lovenox and he need to follow-up with his oncologist at [facility].  No significant recorded hypoxia recorded.  Patient was able to ambulate without any hypoxia and maintaining saturation in high 90s.  Discussed with vascular surgery regarding placement of IVC as patient developed PE with an active cancer and being on Eliquis for DVT.  They were suggesting Lovenox and follow-up with his oncologist  for further recommendations.  They will defer placement of IVC at this time.  Patient was started on Lovenox injections, heparin infusion was discontinued and he was asked to stop taking Eliquis and have a close follow-up with his oncologist for further  recommendations.  Patient remained stable and is being discharged home to continue his home medications, Eliquis was discontinued and he was started on Lovenox and need to have a close follow-up with his oncologist for further recommendations.  Assessment and Plan: * Acute pulmonary embolism ([place]) - This is a small nonocclusive pulmonary embolus in the right upper lobe segmental branch with small thromboembolic burden. - The patient will be admitted to the progressive unit bed. - He will be placed on IV heparin and will hold off p.o. Eliquis. - Vascular surgery consult will be obtained. - I notified Dr. Gigi Gin about the patient. - Medical oncology consult will be obtained. - I notified Dr. Janese Banks about the patient.  Acute lymphoblastic leukemia in remission ([place]) - We will hold off ponatinib for now as venous thromboembolism is one of its side effects pending oncology consult. - I notified Dr. Janese Banks about the patient as mentioned above. - Pain management will be provided..  Essential hypertension - We will continue his antihypertensives.  Tobacco abuse Will be counseled for smoking cessation.   Consultants: Cardiology, vascular surgery Procedures performed: None Disposition: Home Diet recommendation:  Discharge Diet Orders (From admission, onward)     Start     Ordered   02/05/22 0000  Diet - low sodium heart healthy        02/05/22 1253           Regular diet DISCHARGE MEDICATION: Allergies as of 02/05/2022       Reactions   Bupropion Other (See Comments)   Per patient "out of touch with reality, suicidal, homicidal"   Cefepime Rash   DRESS   Ceftaroline Other (See Comments), Rash   Rash X 2 02/2020, suspected DRESS 06/2020   Dapsone Anaphylaxis, Other (See Comments)   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   Onion Anaphylaxis   Vancomycin Other (See Comments)   Hearing loss    lasix    Hearing loss with Lasix Other reaction(s): Other (See Comments) Hearing loss  lasix Hearing loss with Lasix    Other reaction(s): Other (See Comments)    Hearing loss    lasix    Hearing loss  lasix  Hearing loss with Lasix Other reaction(s): Other (See Comments) Hearing loss lasix   Furosemide Other (See Comments)   With Vancomycin caused hearing loss    With Vancomycin caused hearing loss Other reaction(s): Other (See Comments) With Vancomycin caused hearing loss With Vancomycin caused hearing loss    Other reaction(s): Other (See Comments)    With Vancomycin caused hearing loss  With Vancomycin caused hearing loss Other reaction(s): Other (See Comments) With Vancomycin caused hearing loss   Immune Globulin Other (See Comments)   Pepto-bismol [bismuth] Nausea And Vomiting        Medication List     STOP taking these medications    apixaban 5 MG Tabs tablet Commonly known as: ELIQUIS   buprenorphine 5 MCG/HR Ptwk Commonly known as: BUTRANS   celecoxib 200 MG capsule Commonly known as: CELEBREX   umeclidinium-vilanterol 62.5-25 MCG/ACT Aepb Commonly known as: ANORO ELLIPTA       TAKE these medications    ALPRAZolam 0.25 MG tablet Commonly known as: XANAX Take 0.25 mg by mouth 2 (two) times daily as  needed.   amLODipine 10 MG tablet Commonly known as: NORVASC Take 1 tablet by mouth daily.   carvedilol 12.5 MG tablet Commonly known as: COREG Take 12.5 mg by mouth 2 (two) times daily with a meal.   doxycycline 100 MG capsule Commonly known as: MONODOX Take 100 mg by mouth 2 (two) times daily.   enoxaparin 150 MG/ML injection Commonly known as: LOVENOX Inject 0.88 mLs (132 mg total) into the skin every 12 (twelve) hours.   gabapentin 300 MG capsule Commonly known as: NEURONTIN Take 300 mg by mouth at bedtime.   hydrochlorothiazide 12.5 MG tablet Commonly known as: HYDRODIURIL Take 1 tablet by mouth daily.   mirtazapine 15 MG tablet Commonly known as: REMERON Take 15 mg by mouth at bedtime.   nortriptyline 50 MG  capsule Commonly known as: PAMELOR Take 50 mg by mouth 2 (two) times daily.   omeprazole 40 MG capsule Commonly known as: PRILOSEC Take 40 mg by mouth daily.   ondansetron 4 MG disintegrating tablet Commonly known as: ZOFRAN-ODT Take 4 mg by mouth every 8 (eight) hours as needed.   oxyCODONE 10 mg 12 hr tablet Commonly known as: OXYCONTIN Take 10 mg by mouth at bedtime.   PONATinib HCl 30 MG tablet Commonly known as: ICLUSIG Take 30 mg by mouth daily.   spironolactone 25 MG tablet Commonly known as: ALDACTONE Take 1 tablet by mouth daily.   sulfamethoxazole-trimethoprim 800-160 MG tablet Commonly known as: BACTRIM DS Take 1 tablet by mouth as directed.   valACYclovir 500 MG tablet Commonly known as: VALTREX Take 1 tablet by mouth daily.        Follow-up Information     Leonel Ramsay, MD. Schedule an appointment as soon as possible for a visit in 1 week(s).   Specialty: Infectious Diseases Contact information: [place] [address] [phone]                Discharge Exam: Danley Danker Weights   02/04/22 2027  Weight: 131.7 kg   General.  Obese gentleman, in no acute distress. Pulmonary.  Lungs clear bilaterally, normal respiratory effort. CV.  Regular rate and rhythm, no JVD, rub or murmur. Abdomen.  Soft, nontender, nondistended, BS positive. CNS.  Alert and oriented .  No focal neurologic deficit. Extremities.  No edema, no cyanosis, pulses intact and symmetrical. Psychiatry.  Judgment and insight appears normal.   Condition at discharge: stable  The results of significant diagnostics from this hospitalization (including imaging, microbiology, ancillary and laboratory) are listed below for reference.   Imaging Studies: CT Angio Chest PE W and/or Wo Contrast  Result Date: 02/04/2022 CLINICAL DATA:  Pulmonary embolism (PE) suspected, high prob cancer, hypoxia, tachcyardia Shortness of breath. Chemotherapy for leukemia.  EXAM: CT ANGIOGRAPHY CHEST WITH CONTRAST TECHNIQUE: Multidetector CT imaging of the chest was performed using the standard protocol during bolus administration of intravenous contrast. Multiplanar CT image reconstructions and MIPs were obtained to evaluate the vascular anatomy. RADIATION DOSE REDUCTION: This exam was performed according to the departmental dose-optimization program which includes automated exposure control, adjustment of the mA and/or kV according to patient size and/or use of iterative reconstruction technique. CONTRAST:  61m OMNIPAQUE IOHEXOL 350 MG/ML SOLN COMPARISON:  Radiograph earlier today. Report from chest CT 05/17/2021 performed at an outside institution. Please note patient has had multiple prior outside exams that are not available for review. FINDINGS: Cardiovascular: Small nonocclusive pulmonary embolus in the upper lobe segmental branch. Thromboembolic burden is small. No other  pulmonary arterial filling defects. The heart is normal in size. Extensive left ventricular myocardial calcifications. No pericardial effusion. Mediastinum/Nodes: Right paratracheal node measures 11 mm, series 4 image 38. There is an upper paratracheal node measuring 9 mm series 4 image 23. No enlarged hilar nodes. No esophageal wall thickening. There is no visible thyroid nodule Lungs/Pleura: There is volume loss in the right hemithorax with opacity in the right upper lobe extending to the apex. Suspected chronic areas of bronchiolectasis. Subsegmental opacities in the right middle lobe. There is background emphysema with bronchial thickening. No pleural effusion. Upper Abdomen: Suspected hepatic steatosis. No acute upper abdominal findings. There is no upper abdominal adenopathy. Musculoskeletal: There are no acute or suspicious osseous abnormalities. Review of the MIP images confirms the above findings. IMPRESSION: 1. Small nonocclusive pulmonary embolus in the right upper lobe segmental branch.  Thromboembolic burden is small. 2. Volume loss in the right hemithorax with opacity in the right upper lobe extending to the apex. This has been reported on prior outside imaging which is not available for review. Stability/acuity cannot be determined. 3. Few prominent/borderline mediastinal nodes measuring up to 11 mm. Comparison with prior imaging is recommended to assess for stability, given leukemia history. 4. Left ventricular myocardial calcifications, chronic. Emphysema (ICD10-J43.9). Critical Value/emergent results were called by telephone at the time of interpretation on 02/04/2022 at 7:40 pm to provider Joni Fears , who verbally acknowledged these results. Electronically Signed   By: Keith Rake M.D.   On: 02/04/2022 19:43   DG Chest 2 View  Result Date: 02/04/2022 CLINICAL DATA:  Chest pain, shortness of breath and cough. No fever. EXAM: CHEST - 2 VIEW COMPARISON:  01/20/2020 FINDINGS: Normal sized heart. Interval patchy density in the right upper lung zone with loculated pleural fluid laterally. Additional smaller loculations of pleural fluid laterally in the right lower lung zone. Clear left lung. Unremarkable bones. IMPRESSION: 1. Interval probable pneumonia in the right upper lung zone with loculated pleural fluid laterally. If the patient does not have clinical findings of infection, a chest CT with contrast would be recommended to exclude malignancy. If he does have clinical signs of infection, follow-up PA and lateral chest radiographs would be recommended in 3-4 weeks following trial of antibiotic therapy to ensure resolution and exclude underlying malignancy. 2. Additional smaller loculations of pleural fluid laterally in the right lower lung zone. Electronically Signed   By: Claudie Revering M.D.   On: 02/04/2022 18:09    Microbiology: Results for orders placed or performed during the hospital encounter of 02/04/22  Resp Panel by RT-PCR (Flu A&B, Covid) Anterior Nasal Swab     Status: None    Collection Time: 02/04/22  5:18 PM   Specimen: Anterior Nasal Swab  Result Value Ref Range Status   SARS Coronavirus 2 by RT PCR NEGATIVE NEGATIVE Final    Comment: (NOTE) SARS-CoV-2 target nucleic acids are NOT DETECTED.  The SARS-CoV-2 RNA is generally detectable in upper respiratory specimens during the acute phase of infection. The lowest concentration of SARS-CoV-2 viral copies this assay can detect is 138 copies/mL. A negative result does not preclude SARS-Cov-2 infection and should not be used as the sole basis for treatment or other patient management decisions. A negative result may occur with  improper specimen collection/handling, submission of specimen other than nasopharyngeal swab, presence of viral mutation(s) within the areas targeted by this assay, and inadequate number of viral copies(<138 copies/mL). A negative result must be combined with clinical observations, patient history, and epidemiological  information. The expected result is Negative.  Fact Sheet for Patients:  EntrepreneurPulse.com.au  Fact Sheet for Healthcare Providers:  IncredibleEmployment.be  This test is no t yet approved or cleared by the [place] FDA and  has been authorized for detection and/or diagnosis of SARS-CoV-2 by FDA under an Emergency Use Authorization (EUA). This EUA will remain  in effect (meaning this test can be used) for the duration of the COVID-19 declaration under Section 564(b)(1) of the Act, 21 U.S.C.section 360bbb-3(b)(1), unless the authorization is terminated  or revoked sooner.       Influenza A by PCR NEGATIVE NEGATIVE Final   Influenza B by PCR NEGATIVE NEGATIVE Final    Comment: (NOTE) The Xpert Xpress SARS-CoV-2/FLU/RSV plus assay is intended as an aid in the diagnosis of influenza from Nasopharyngeal swab specimens and should not be used as a sole basis for treatment. Nasal washings and aspirates are unacceptable for  Xpert Xpress SARS-CoV-2/FLU/RSV testing.  Fact Sheet for Patients: EntrepreneurPulse.com.au  Fact Sheet for Healthcare Providers: IncredibleEmployment.be  This test is not yet approved or cleared by the [place] FDA and has been authorized for detection and/or diagnosis of SARS-CoV-2 by FDA under an Emergency Use Authorization (EUA). This EUA will remain in effect (meaning this test can be used) for the duration of the COVID-19 declaration under Section 564(b)(1) of the Act, 21 U.S.C. section 360bbb-3(b)(1), unless the authorization is terminated or revoked.  Performed at [facility], [address] [place], [place] [address]   Culture, blood (Routine x 2)     Status: None (Preliminary result)   Collection Time: 02/04/22  5:18 PM   Specimen: BLOOD  Result Value Ref Range Status   Specimen Description BLOOD BLOOD LEFT ARM  Final   Special Requests   Final    BOTTLES DRAWN AEROBIC AND ANAEROBIC Blood Culture adequate volume   Culture   Final    NO GROWTH < 24 HOURS Performed at [facility], [address], [place], [place] [address]    Report Status PENDING  Incomplete  Culture, blood (Routine x 2)     Status: None (Preliminary result)   Collection Time: 02/04/22  5:23 PM   Specimen: BLOOD  Result Value Ref Range Status   Specimen Description BLOOD BLOOD RIGHT ARM  Final   Special Requests   Final    BOTTLES DRAWN AEROBIC AND ANAEROBIC Blood Culture results may not be optimal due to an inadequate volume of blood received in culture bottles   Culture   Final    NO GROWTH < 24 HOURS Performed at [facility], [address] [place], [place] [address]    Report Status PENDING  Incomplete    Labs: CBC: Recent Labs  Lab 02/04/22 1748 02/05/22 0452  WBC 13.2* 11.3*  NEUTROABS 10.1*  --   HGB 16.7 16.4  HCT 49.4 47.8  MCV 85.8 86.1  PLT 238 026   Basic Metabolic Panel: Recent Labs  Lab  02/04/22 1748 02/05/22 0452  NA 138 137  K 4.5 4.3  CL 101 102  CO2 28 27  GLUCOSE 102* 117*  BUN 22* 23*  CREATININE 2.22* 1.95*  CALCIUM 9.5 8.9   Liver Function Tests: Recent Labs  Lab 02/04/22 1748  AST 33  ALT 57*  ALKPHOS 86  BILITOT 1.1  PROT 7.5  ALBUMIN 4.7   CBG: No results for input(s): "GLUCAP" in the last 168 hours.  Discharge time spent: greater than 30 minutes.  This record  has been created using Systems analyst. Errors have been sought and corrected,but may not always be located. Such creation errors do not reflect on the standard of care.   Signed: Lorella Nimrod, MD [facility] 02/05/2022

## 2022-02-06 ENCOUNTER — Ambulatory Visit
Admit: 2022-02-06 | Discharge: 2022-02-08 | Disposition: A | Discharge status: Home / Self Care | Payer: MEDICAID | Admitting: Hematology & Oncology

## 2022-02-06 LAB — CBC W/ AUTO DIFF
BASOPHILS ABSOLUTE COUNT: 0.1 10*9/L (ref 0.0–0.1)
BASOPHILS RELATIVE PERCENT: 1.1 %
EOSINOPHILS ABSOLUTE COUNT: 0.2 10*9/L (ref 0.0–0.5)
EOSINOPHILS RELATIVE PERCENT: 2.9 %
HEMATOCRIT: 43.6 % (ref 39.0–48.0)
HEMOGLOBIN: 15.1 g/dL (ref 12.9–16.5)
LYMPHOCYTES ABSOLUTE COUNT: 1.6 10*9/L (ref 1.1–3.6)
LYMPHOCYTES RELATIVE PERCENT: 21.8 %
MEAN CORPUSCULAR HEMOGLOBIN CONC: 34.7 g/dL (ref 32.0–36.0)
MEAN CORPUSCULAR HEMOGLOBIN: 29.7 pg (ref 25.9–32.4)
MEAN CORPUSCULAR VOLUME: 85.4 fL (ref 77.6–95.7)
MEAN PLATELET VOLUME: 8.7 fL (ref 6.8–10.7)
MONOCYTES ABSOLUTE COUNT: 0.7 10*9/L (ref 0.3–0.8)
MONOCYTES RELATIVE PERCENT: 10.2 %
NEUTROPHILS ABSOLUTE COUNT: 4.6 10*9/L (ref 1.8–7.8)
NEUTROPHILS RELATIVE PERCENT: 64 %
PLATELET COUNT: 189 10*9/L (ref 150–450)
RED BLOOD CELL COUNT: 5.1 10*12/L (ref 4.26–5.60)
RED CELL DISTRIBUTION WIDTH: 15.4 % — ABNORMAL HIGH (ref 12.2–15.2)
WBC ADJUSTED: 7.2 10*9/L (ref 3.6–11.2)

## 2022-02-06 LAB — APTT
APTT: 41 s — ABNORMAL HIGH (ref 24.8–38.4)
APTT: 105.1 s — ABNORMAL HIGH (ref 24.8–38.4)
HEPARIN CORRELATION: 0.2
HEPARIN CORRELATION: 0.6

## 2022-02-06 LAB — HIGH SENSITIVITY TROPONIN I - 2 HOUR SERIAL
HIGH SENSITIVITY TROPONIN - DELTA (0-2H): 0 ng/L (ref <=7)
HIGH-SENSITIVITY TROPONIN I - 2 HOUR: 6 ng/L (ref <=53)

## 2022-02-06 LAB — COMPREHENSIVE METABOLIC PANEL
ALBUMIN: 3.9 g/dL (ref 3.4–5.0)
ALKALINE PHOSPHATASE: 85 U/L (ref 46–116)
ALT (SGPT): 53 U/L — ABNORMAL HIGH (ref 10–49)
ANION GAP: 8 mmol/L (ref 5–14)
AST (SGOT): 31 U/L (ref <=34)
BILIRUBIN TOTAL: 0.4 mg/dL (ref 0.3–1.2)
BLOOD UREA NITROGEN: 20 mg/dL (ref 9–23)
BUN / CREAT RATIO: 11
CALCIUM: 9.1 mg/dL (ref 8.7–10.4)
CHLORIDE: 105 mmol/L (ref 98–107)
CO2: 25 mmol/L (ref 20.0–31.0)
CREATININE: 1.77 mg/dL — ABNORMAL HIGH
EGFR CKD-EPI (2021) MALE: 49 mL/min/{1.73_m2} — ABNORMAL LOW (ref >=60)
GLUCOSE RANDOM: 95 mg/dL (ref 70–179)
POTASSIUM: 4.3 mmol/L (ref 3.4–4.8)
PROTEIN TOTAL: 6.5 g/dL (ref 5.7–8.2)
SODIUM: 138 mmol/L (ref 135–145)

## 2022-02-06 LAB — HIGH SENSITIVITY TROPONIN I - 6 HOUR SERIAL
HIGH SENSITIVITY TROPONIN - DELTA (2-6H): 0 ng/L (ref <=7)
HIGH-SENSITIVITY TROPONIN I - 6 HOUR: 6 ng/L (ref <=53)

## 2022-02-06 LAB — HIGH SENSITIVITY TROPONIN I - SERIAL: HIGH SENSITIVITY TROPONIN I: 6 ng/L (ref <=53)

## 2022-02-06 MED ADMIN — oxyCODONE (ROXICODONE) immediate release tablet 10 mg: 10 mg | ORAL | @ 22:00:00 | Stop: 2022-02-06

## 2022-02-06 MED ADMIN — lidocaine 4 % patch 1 patch: 1 | TRANSDERMAL | @ 22:00:00

## 2022-02-06 MED ADMIN — heparin (porcine) 1000 unit/mL injection 5,000 Units: 5000 [IU] | INTRAVENOUS | @ 21:00:00

## 2022-02-06 MED ADMIN — morphine 4 mg/mL injection 6 mg: 6 mg | INTRAVENOUS | @ 19:00:00 | Stop: 2022-02-06

## 2022-02-06 MED ADMIN — ipratropium-albuteroL (DUO-NEB) 0.5-2.5 mg/3 mL nebulizer solution 3 mL: 3 mL | RESPIRATORY_TRACT | @ 19:00:00 | Stop: 2022-02-06

## 2022-02-06 MED ADMIN — HYDROmorphone (DILAUDID) tablet 2 mg: 2 mg | ORAL | @ 23:00:00 | Stop: 2022-02-20

## 2022-02-06 MED ADMIN — acetaminophen (TYLENOL) tablet 1,000 mg: 1000 mg | ORAL

## 2022-02-06 MED ADMIN — heparin 25,000 Units/250 mL (100 units/mL) in 5% dextrose infusion: 0-24 [IU]/kg/h | INTRAVENOUS | @ 21:00:00

## 2022-02-06 NOTE — Unmapped (Signed)
Pt reports that he has a known PE in R lung, cancer pt/immunocompromised. Lung pain, SOB, weakness.

## 2022-02-06 NOTE — Unmapped (Addendum)
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     AHRF - Rhinovirus  Presented with dyspnea and dry cough, new O2 requirement since discharge from OSH 11/4. RPP 11/5 positive for rhinovirus. No acute changes on CT chest. Continued supportive care, PE treatment, and COPD treatment. Able to tolerate ambulation test on room air for home oxygen evaluation.     Pulmonary Embolism - DVT  DVT found 12/2021, patient started on Eliquis. Presented to OSH 11/3 with dyspnea found to have small nonocclusive PE. OSH decided against IVC filter, discharged on 11/4 on Lovenox twice daily. At [facility] patient started on heparin ggt. PVLs returned WNL. APLS panel is pending. Patient was discharged back on eliquis based on the normal PVL and d-dimer findings, with less concern for new clot and possible inconsistent dosing of eliquis previously.     Chronic Pain - c/f Costochondritis vs Nerve Damage  Patient has chronic pain iso fibromyalgia and malignancy. Previous chest tube insertion at site of pain. Follows with Dr. Remus Loffler. Palliative consulted to assist with pain management, started muscle relaxer but not found to be effective.  At discharge, was restarted on home medications.     COPD - Tobacco Use Disorder  No home O2 requirement. Patient has known COPD in the setting of tobacco use, does not use inhalers at home. COVID infection 10/2021. Scheduled duo nebs during hospitalization. Discharged with anoro epllipta.     Pre B-ALL  Follows with Dr. Senaida Ores. 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 Krusei), MRSA bacteremia w/ septic emboli c/b acute kidney failure and respiratory failure requiring CRRT and intubation. Treated with Dasatinib, difficulty tolerating due to edema. S/p C4 Ponatinib-Blinatumomab. Remains on ponatinib monotherapy 01/2021. Has decided against transplant. Recent restaging shows remission.  Prophylactic medications continued on admission.      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   MDD - Anxiety: nortriptyline continued, hold mirtazapine   GERD: home PPI continued

## 2022-02-06 NOTE — Unmapped (Signed)
Pt reports recently stopped taking Eliquis and switched to SubQ Heparin. Actively on chemo.

## 2022-02-06 NOTE — Unmapped (Signed)
Bed: 67-D  Expected date:   Expected time:   Means of arrival:   Comments:  Room 3

## 2022-02-06 NOTE — Unmapped (Signed)
[facility] Emergency Department Provider Note      ED Clinical Impression     Final diagnoses:   Pulmonary embolism, unspecified chronicity, unspecified pulmonary embolism type, unspecified whether acute cor pulmonale present (CMS-HCC) (Primary)   Hypoxia       HPI, ED Course, Assessment and Plan     Initial Clinical Impression:    February 06, 2022 12:58 PM   Adam Keith is a 41 y.o. 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 The patient reports feeling lightheaded on ambulation. He states he was hypoxic this morning on his home pulse ox. Per chart review, the patient was admitted 11/3-11/4 for an acute PE in the RUL segmental branch despite being on Eliquis for DVT. His eliquis was discontinued and he was started on Lovenox at that time. The patient reports a fever last night that resolved with tylenol as well as several days of a dry cough. He is not on O2 at baseline. Denies current fever, chills, or hemoptysis.    BP 176/109  - Pulse 89  - Temp 36.4 ??C (97.5 ??F) (Oral)  - Resp 18  - Wt (!) 136.1 kg (300 lb)  - SpO2 92%  - BMI 38.50 kg/m??     Medical Decision Making  41 y.o. male with history above. Vital signs remarkable for hypertension. Saturating 91% on RA. Afebrile. On exam, mild inspiratory wheezing in right lower lobe.     41 year old male with history above.  This patient is history is a bit complicated.  With a history of leukemia, with DVTs formally on Eliquis and developed a PE while on this recently.  He went to an outside hospital, where they consider potential IVC filter but ultimately decided against this and had him follow-up with his oncologist here, switched his anticoagulation from Eliquis to Lovenox.  Came in today with worsening chest pain and shortness of breath, was found to be hypoxic.  Differential included COPD exacerbation versus worsening pulmonary embolism burden lower suspicion for ACS.  Did try DuoNeb which did not improve his symptoms.  Suspect this is related to worsening burden.  Discussed with admitting team, will admit patient, will start heparin down in the ED without loading dose.    Amount and/or Complexity of Data Reviewed  External Data Reviewed: notes.     Details: 02/05/22 Discharge Summary for PMH and acute PE while on anticoagulation  Labs: ordered. Decision-making details documented in ED Course.  Radiology: ordered. Decision-making details documented in ED Course.  ECG/medicine tests: ordered. Decision-making details documented in ED Course.          Further ED updates and updates to plan as per ED Course below:    ED Course:       Social Determinants of Health with Concerns     Internet Connectivity: Not on file   Tobacco Use: High Risk (02/06/2022)    Patient History     Smoking Tobacco Use: Every Day     Smokeless Tobacco Use: Former     Passive Exposure: Current   Alcohol Use: Not on file   Substance Use: Not on file   Health Literacy: Not on file   Physical Activity: Not on file   Interpersonal Safety: Not on file   Stress: Not on file   Intimate Partner Violence: Not on file  Depression: Not on file   Social Connections: Not on file     _____________________________________________________________________    The case was discussed with the attending physician who is in agreement with the above assessment and plan    Additional Medical Decision Making     I have reviewed the vital signs and the nursing notes. Labs and radiology results that were available during my care of the patient were independently reviewed by me and considered in my medical decision making.   I independently visualized the EKG tracing if performed  I independently visualized the radiology images if performed  I reviewed the patient's prior medical records if available.  Additional history obtained from family if available.  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       Past Surgical History:   Procedure Laterality Date    BONE MARROW BIOPSY & ASPIRATION  01/21/2020         CHG [place], CHEST,REAL TIME  11/20/2020    Procedure: ULTRASOUND, CHEST, REAL TIME WITH IMAGE DOCUMENTATION;  Surgeon: Jerelyn Charles, MD;  Location: [facility]ON[facility] PROCEDURE LAB [facility];  Service: Pulmonary    IR INSERT PORT AGE GREATER THAN 5 YRS  03/10/2020    IR INSERT PORT AGE GREATER THAN 5 YRS 03/10/2020 [name], MD IMG VIR H&V [facility]    PR BRONCHOSCOPY,DIAGNOSTIC W LAVAGE Bilateral 07/20/2020    Procedure: BRONCHOSCOPY, RIGID OR FLEXIBLE, INCLUDE FLUOROSCOPIC GUIDANCE WHEN PERFORMED; W/BRONCHIAL ALVEOLAR LAVAGE WITH MODERATE SEDATION;  Surgeon: Dellis Filbert, MD;  Location: [facility] PROCEDURE LAB [facility];  Service: Pulmonary       MEDICATIONS:     Current Facility-Administered Medications:     heparin (porcine) 1000 unit/mL injection 5,000 Units, 5,000 Units, Intravenous, Q6H PRN, Slauson, Ovidio Hanger., MD    heparin 25,000 Units/250 mL (100 units/mL) in 5% dextrose infusion, 0-24 Units/kg/hr, Intravenous, Continuous, Slauson, Ovidio Hanger., MD    Current Outpatient Medications:     amLODIPine (NORVASC) 10 MG tablet, Take 1 tablet (10 mg total) by mouth daily., Disp: 30 tablet, Rfl: 0    apixaban (ELIQUIS) 5 mg Tab, Take 1 tablet (5 mg total) by mouth two (2) times a day., Disp: , Rfl: 0    arm brace (WRIST BRACE) Misc, 1 Piece by Miscellaneous route as needed. left ulnar wrist brace, Disp: 1 each, Rfl: 0    buprenorphine 5 mcg/hour PTWK transdermal patch, Place 1 patch on the skin every seven (7) days for 28 days., Disp: 4 patch, Rfl: 0    carvediloL (COREG) 12.5 MG tablet, Take 1 tablet (12.5 mg total) by mouth Two (2) times a day., Disp: 180 tablet, Rfl: 1    docusate sodium (COLACE) 100 MG capsule, Take 1 capsule (100 mg total) by mouth daily., Disp: , Rfl:     doxycycline (VIBRA-TABS) 100 MG tablet, Take 1 tablet (100 mg total) by mouth Two (2) times a day., Disp: 180 tablet, Rfl: 3    gabapentin (NEURONTIN) 300 MG capsule, Take 2 capsules (600mg ) by mouth in the morning and 3 capsules (900mg ) at bedtime. May cause drowsiness., Disp: 150 capsule, Rfl: 5    hydroCHLOROthiazide (HYDRODIURIL) 12.5 MG tablet, Take 1 tablet (12.5 mg total) by mouth daily., Disp: 30 tablet, Rfl: 11  mirtazapine (REMERON) 15 MG tablet, TAKE 1 TABLET BY MOUTH EVERY DAY AT NIGHT, Disp: 90 tablet, Rfl: 4    nortriptyline (PAMELOR) 50 MG capsule, Take 2 capsules (100 mg total) by mouth nightly., Disp: , Rfl:     omeprazole (PRILOSEC) 40 MG capsule, Take 1 capsule (40 mg total) by mouth every morning., Disp: , Rfl:     oxyCODONE (OXYCONTIN) 10 mg TR12 12 hr crush resistant ER/CR tablet, Take 1 tablet (10 mg total) by mouth nightly for 28 days., Disp: 28 tablet, Rfl: 0    PONATinib (ICLUSIG) 30 mg tablet, Take 1 tablet (30 mg total) by mouth daily. Swallow tablets whole. Do not crush, break, cut or chew tablets., Disp: 30 tablet, Rfl: 5    spironolactone (ALDACTONE) 25 MG tablet, Take 1 tablet (25 mg total) by mouth daily., Disp: 90 tablet, Rfl: 3    sulfamethoxazole-trimethoprim (BACTRIM DS) 800-160 mg per tablet, TAKE 1 TABLET BY MOUTH 2 TIMES A DAY ON SATURDAY, SUNDAY. FOR PROPHYLAXIS WHILE ON CHEMO., Disp: 48 tablet, Rfl: 3    umeclidinium-vilanteroL (ANORO ELLIPTA) 62.5-25 mcg/actuation inhaler, Inhale 1 puff daily., Disp: 60 each, Rfl: 11    valACYclovir (VALTREX) 500 MG tablet, Take 1 tablet (500 mg total) by mouth daily., Disp: 90 tablet, Rfl: 11    ALLERGIES:   Bupropion hcl, Cefepime, Ceftaroline fosamil, Dapsone, Onion, Vancomycin analogues, Bismuth subsalicylate, Privigen [immun glob g(igg)-pro-iga 0-50], Furosemide, and Gammagard    SOCIAL HISTORY:   Social History     Tobacco Use    Smoking status: Every Day     Packs/day: 2     Types: Cigarettes     Passive exposure: Current    Smokeless tobacco: Former     Types: Chew   Substance Use Topics    Alcohol use: Yes     Comment: Rarely       FAMILY HISTORY:  Family History   Problem Relation Age of Onset    Cancer Mother     No Known Problems Father     No Known Problems Sister     No Known Problems Brother     No Known Problems Maternal Aunt     No Known Problems Maternal Uncle     No Known Problems Paternal Aunt     No Known Problems Paternal Uncle     No Known Problems Maternal Grandmother     Cancer Maternal Grandfather     No Known Problems Paternal Grandmother     No Known Problems Paternal Grandfather     Melanoma Neg Hx     Basal cell carcinoma Neg Hx     Squamous cell carcinoma Neg Hx           Review of Systems     A review of systems was performed and relevant portions were as noted above in HPI     Physical Exam     VITAL SIGNS:    BP 176/109  - Pulse 89  - Temp 36.4 ??C (97.5 ??F) (Oral)  - Resp 18  - Wt (!) 136.1 kg (300 lb)  - SpO2 92%  - BMI 38.50 kg/m??       Constitutional:   Alert and oriented.   Head:   Normocephalic and atraumatic  Eyes:   Conjunctivae are norma.  ENT:   No notable congestion, Mucous membranes moist, no notable stridor  Cardiovascular:   Rate as vitals above. Appears warm and well perfused  Respiratory:   Normal respiratory effort. Mild  inspiratory wheezing in right lower lobe.  Gastrointestinal:   Soft, non-distended, and nontender.   Genitourinary:   Deferred  Musculoskeletal:    Normal range of motion in all extremities. No tenderness or edema noted in B/L lower extremities  Neurologic:   No gross focal neurologic deficits beyond baseline are appreciated.  Skin:   Skin is warm, dry and intact.       Radiology     XR Chest 1 view Portable   Final Result      Low lung volumes. Right apical scarring similar previous.      Small right pleural effusion versus scarring.      CT Chest Interpretation of Outside film    (Results Pending)       Labs     Labs Reviewed   COMPREHENSIVE METABOLIC PANEL - Abnormal; Notable for the following components:       Result Value    Creatinine 1.77 (*)     eGFR CKD-EPI (2021) Male 63 (*)     ALT 53 (*)     All other components within normal limits   CBC W/ AUTO DIFF - Abnormal; Notable for the following components:    RDW 15.4 (*)     All other components within normal limits   INFLUENZA/RSV/COVID PCR - Normal    Narrative:     This test was performed using the Cepheid Xpert Xpress CoV-2/Flu/RSV plus assay, which has been validated by the CLIA-certified, CAP-inspected [employer] FDA has granted Emergency Use Authorization for this test. Negative results do not preclude infection and should be interpreted along with clinical observations, patient history, and epidemiological information. Information for providers and patients can be found here: [email or web address]   HIGH SENSITIVITY TROPONIN I - SERIAL - Normal   BLOOD CULTURE   BLOOD CULTURE   RESPIRATORY PATHOGEN PANEL WITH COVID   CBC W/ DIFFERENTIAL    Narrative:     The following orders were created for panel order CBC w/ Differential.                  Procedure                               Abnormality         Status                                     ---------                               -----------         ------                                     CBC w/ Differential[[id number]]         Abnormal            Final result                                                 Please view results for these tests on the individual orders.  HIGH SENSITIVITY TROPONIN I - 2 HOUR SERIAL   APTT         Pertinent labs & imaging results that were available during my care of the patient were reviewed by me and considered in my medical decision making (see chart for details).    Please note- This chart has been created using [employer]. Chart creation errors have been sought, but may not always be located and such creation errors, especially pronoun confusion, do NOT reflect on the standard of medical care.    Documentation assistance was provided by Gus Height, Scribe on February 06, 2022 at 1:02 PM for Curlene Labrum, MD.       Documentation assistance was provided by the scribe in my presence.  The documentation recorded by the scribe has been reviewed by me and accurately reflects the services I personally performed.           Slauson, Ovidio Hanger., MD  Resident  02/06/22 [phone]

## 2022-02-07 LAB — CBC W/ AUTO DIFF
BASOPHILS ABSOLUTE COUNT: 0.1 10*9/L (ref 0.0–0.1)
BASOPHILS RELATIVE PERCENT: 1 %
EOSINOPHILS ABSOLUTE COUNT: 0.3 10*9/L (ref 0.0–0.5)
EOSINOPHILS RELATIVE PERCENT: 4.9 %
HEMATOCRIT: 46.2 % (ref 39.0–48.0)
HEMOGLOBIN: 15.8 g/dL (ref 12.9–16.5)
LYMPHOCYTES ABSOLUTE COUNT: 2 10*9/L (ref 1.1–3.6)
LYMPHOCYTES RELATIVE PERCENT: 38.1 %
MEAN CORPUSCULAR HEMOGLOBIN CONC: 34.2 g/dL (ref 32.0–36.0)
MEAN CORPUSCULAR HEMOGLOBIN: 29.7 pg (ref 25.9–32.4)
MEAN CORPUSCULAR VOLUME: 86.9 fL (ref 77.6–95.7)
MEAN PLATELET VOLUME: 8.4 fL (ref 6.8–10.7)
MONOCYTES ABSOLUTE COUNT: 0.5 10*9/L (ref 0.3–0.8)
MONOCYTES RELATIVE PERCENT: 9.1 %
NEUTROPHILS ABSOLUTE COUNT: 2.5 10*9/L (ref 1.8–7.8)
NEUTROPHILS RELATIVE PERCENT: 46.9 %
PLATELET COUNT: 168 10*9/L (ref 150–450)
RED BLOOD CELL COUNT: 5.32 10*12/L (ref 4.26–5.60)
RED CELL DISTRIBUTION WIDTH: 15.8 % — ABNORMAL HIGH (ref 12.2–15.2)
WBC ADJUSTED: 5.2 10*9/L (ref 3.6–11.2)

## 2022-02-07 LAB — BASIC METABOLIC PANEL
ANION GAP: 9 mmol/L (ref 5–14)
BLOOD UREA NITROGEN: 20 mg/dL (ref 9–23)
BUN / CREAT RATIO: 10
CALCIUM: 9 mg/dL (ref 8.7–10.4)
CHLORIDE: 104 mmol/L (ref 98–107)
CO2: 26 mmol/L (ref 20.0–31.0)
CREATININE: 2 mg/dL — ABNORMAL HIGH
EGFR CKD-EPI (2021) MALE: 42 mL/min/{1.73_m2} — ABNORMAL LOW (ref >=60)
GLUCOSE RANDOM: 99 mg/dL (ref 70–179)
POTASSIUM: 4.3 mmol/L (ref 3.4–4.8)
SODIUM: 139 mmol/L (ref 135–145)

## 2022-02-07 LAB — APTT
APTT: 107.9 s — ABNORMAL HIGH (ref 24.8–38.4)
HEPARIN CORRELATION: 0.6

## 2022-02-07 LAB — PHOSPHORUS: PHOSPHORUS: 4.8 mg/dL (ref 2.4–5.1)

## 2022-02-07 LAB — HEPATIC FUNCTION PANEL
ALBUMIN: 4.2 g/dL (ref 3.4–5.0)
ALKALINE PHOSPHATASE: 87 U/L (ref 46–116)
ALT (SGPT): 56 U/L — ABNORMAL HIGH (ref 10–49)
AST (SGOT): 33 U/L (ref <=34)
BILIRUBIN DIRECT: 0.2 mg/dL (ref 0.00–0.30)
BILIRUBIN TOTAL: 0.4 mg/dL (ref 0.3–1.2)
PROTEIN TOTAL: 6.8 g/dL (ref 5.7–8.2)

## 2022-02-07 LAB — MAGNESIUM: MAGNESIUM: 2 mg/dL (ref 1.6–2.6)

## 2022-02-07 LAB — D-DIMER, QUANTITATIVE: D-DIMER QUANTITATIVE (CW,ML,HL,HS,CH,JS,JC,RX): <215 ng{FEU}/mL (ref <=500)

## 2022-02-07 MED ADMIN — tizanidine (ZANAFLEX) tablet 2 mg: 2 mg | ORAL | @ 22:00:00

## 2022-02-07 MED ADMIN — sodium chloride (NS) 0.9 % flush 10 mL: 10 mL | INTRAVENOUS | @ 02:00:00

## 2022-02-07 MED ADMIN — doxycycline (VIBRA-TABS) tablet 100 mg: 100 mg | ORAL | @ 13:00:00 | Stop: 2022-03-08

## 2022-02-07 MED ADMIN — ondansetron (ZOFRAN-ODT) disintegrating tablet 4 mg: 4 mg | ORAL | @ 05:00:00 | Stop: 2022-02-07

## 2022-02-07 MED ADMIN — perflutren protein type-A microspheres (OPTISON) injection 2 mL: 2 mL | INTRAVENOUS | @ 19:00:00 | Stop: 2022-02-07

## 2022-02-07 MED ADMIN — HYDROmorphone (PF) injection Syrg 0.5 mg: .5 mg | INTRAVENOUS | @ 13:00:00 | Stop: 2022-02-07

## 2022-02-07 MED ADMIN — pantoprazole (PROTONIX) EC tablet 40 mg: 40 mg | ORAL | @ 13:00:00

## 2022-02-07 MED ADMIN — carvediloL (COREG) tablet 12.5 mg: 12.5 mg | ORAL | @ 13:00:00

## 2022-02-07 MED ADMIN — HYDROmorphone (PF) injection Syrg 0.5 mg: .5 mg | INTRAVENOUS | @ 16:00:00 | Stop: 2022-02-07

## 2022-02-07 MED ADMIN — acetaminophen (TYLENOL) tablet 1,000 mg: 1000 mg | ORAL | @ 22:00:00

## 2022-02-07 MED ADMIN — acetaminophen (TYLENOL) tablet 1,000 mg: 1000 mg | ORAL | @ 16:00:00

## 2022-02-07 MED ADMIN — HYDROmorphone (PF) injection Syrg 0.5 mg: .5 mg | INTRAVENOUS | @ 06:00:00 | Stop: 2022-02-07

## 2022-02-07 MED ADMIN — ipratropium-albuteroL (DUO-NEB) 0.5-2.5 mg/3 mL nebulizer solution 3 mL: 3 mL | RESPIRATORY_TRACT | @ 16:00:00

## 2022-02-07 MED ADMIN — gabapentin (NEURONTIN) capsule 900 mg: 900 mg | ORAL | @ 02:00:00

## 2022-02-07 MED ADMIN — HYDROmorphone (PF) injection Syrg 0.5 mg: .5 mg | INTRAVENOUS | @ 18:00:00 | Stop: 2022-02-08

## 2022-02-07 MED ADMIN — nortriptyline (PAMELOR) capsule 100 mg: 100 mg | ORAL | @ 02:00:00

## 2022-02-07 MED ADMIN — spironolactone (ALDACTONE) tablet 25 mg: 25 mg | ORAL | @ 13:00:00

## 2022-02-07 MED ADMIN — diclofenac sodium (VOLTAREN) 1 % gel 2 g: 2 g | TOPICAL | @ 22:00:00

## 2022-02-07 MED ADMIN — doxycycline (VIBRA-TABS) tablet 100 mg: 100 mg | ORAL | @ 02:00:00 | Stop: 2022-03-08

## 2022-02-07 MED ADMIN — HYDROmorphone (PF) injection Syrg 0.5 mg: .5 mg | INTRAVENOUS | @ 20:00:00 | Stop: 2022-02-07

## 2022-02-07 MED ADMIN — sulfamethoxazole-trimethoprim (BACTRIM DS) 800-160 mg tablet 160 mg of trimethoprim: 1 | ORAL | @ 02:00:00 | Stop: 2022-03-12

## 2022-02-07 MED ADMIN — amlodipine (NORVASC) tablet 10 mg: 10 mg | ORAL | @ 13:00:00

## 2022-02-07 MED ADMIN — HYDROmorphone (PF) injection Syrg 0.5 mg: .5 mg | INTRAVENOUS | @ 09:00:00 | Stop: 2022-02-07

## 2022-02-07 MED ADMIN — valACYclovir (VALTREX) tablet 500 mg: 500 mg | ORAL | @ 13:00:00

## 2022-02-07 MED ADMIN — ipratropium-albuteroL (DUO-NEB) 0.5-2.5 mg/3 mL nebulizer solution 3 mL: 3 mL | RESPIRATORY_TRACT | @ 09:00:00

## 2022-02-07 MED ADMIN — ipratropium-albuteroL (DUO-NEB) 0.5-2.5 mg/3 mL nebulizer solution 3 mL: 3 mL | RESPIRATORY_TRACT | @ 22:00:00

## 2022-02-07 MED ADMIN — HYDROmorphone (PF) (DILAUDID) injection 1 mg: 1 mg | INTRAVENOUS | @ 23:00:00 | Stop: 2022-02-21

## 2022-02-07 MED ADMIN — heparin 25,000 Units/250 mL (100 units/mL) in 5% dextrose infusion: 0-24 [IU]/kg/h | INTRAVENOUS | @ 11:00:00

## 2022-02-07 MED ADMIN — hydroCHLOROthiazide (HYDRODIURIL) tablet 12.5 mg: 12.5 mg | ORAL | @ 13:00:00

## 2022-02-07 MED ADMIN — carvediloL (COREG) tablet 12.5 mg: 12.5 mg | ORAL | @ 02:00:00

## 2022-02-07 MED ADMIN — gabapentin (NEURONTIN) capsule 600 mg: 600 mg | ORAL | @ 13:00:00

## 2022-02-07 MED ADMIN — lidocaine 4 % patch 1 patch: 1 | TRANSDERMAL | @ 13:00:00 | Stop: 2022-02-07

## 2022-02-07 MED ADMIN — HYDROmorphone (PF) injection Syrg 0.5 mg: .5 mg | INTRAVENOUS | @ 02:00:00 | Stop: 2022-02-08

## 2022-02-07 MED ADMIN — HYDROmorphone (DILAUDID) tablet 2 mg: 2 mg | ORAL | @ 04:00:00 | Stop: 2022-02-20

## 2022-02-07 MED ADMIN — acetaminophen (TYLENOL) tablet 1,000 mg: 1000 mg | ORAL | @ 06:00:00

## 2022-02-07 MED ADMIN — sodium chloride (NS) 0.9 % flush 10 mL: 10 mL | INTRAVENOUS | @ 13:00:00

## 2022-02-07 NOTE — Unmapped (Signed)
OCCUPATIONAL THERAPY  Evaluation (02/07/22 1108)    Patient Name:  Adam Keith       Medical Record Number: [id number]   Date of Birth: Jun 28, 1980  Sex: Male            OT Treatment Diagnosis:  Pt presents to acute OT with slightly decreased activity tolerance and subjective reports of SOB and increased WOB during participation in more difficult iADL routines, but otherwise at or near his functional baseline with no acute deficits impacting pt's ability to complete bADLs safely and independently.    Assessment  Problem List: Decreased endurance    Clinical Decision Making: Low Complexity    Assessment: Adam Keith is an 41 y.o. 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    Consideration of pt's occupational profile, assessment review, level of clinical decision making involved, and intervention plan, indicates pt is currently a low complexity case, presenting to OT at or near his functional baseline with bADL and functional mobility participation.  Post-acute OT not indicated at this time and pt is most appropriate for discharge from acute OT services.    Today's Interventions: Pt rec'd education on role of OT, OT POC, importance of completing bADL routines and functional mobility progressively to promote improved activity tolerance, use of call-bell for any acute needs/concerns, benefits of various energy-conservation strategies (including activity pacing/planning and seated rest-breaks) to help pt pace himself, value of continuing to monitor SpO2 in home-context, and benefits fo reincorporating shower-chair for seated bathing routines to reduce risk of O2 desaturation. Pt participated in brief occupational interview, bed-mobility, sit<>stand t/f, room-level mobility (~30 ft), toilet t/f, LB dressing (donning B slippers).      AM-PAC-Daily Activity  Lower Body Dressing assistance needs: None - Modified Independent/Independent  Bathing assistance needs: None - Modified Independent/Independent  Toileting assistance needs: None - Modified Independent/Independent  Upper Body Dressing assistance needs: None - Modified Independent/Independent  Personal Grooming assistance needs: None - Modified Independent/Independent  Eating Meals assistance needs: None - Modified Independent/Independent    Daily Activity Score:  Daily Activity Score: 24    Score (in points): % of Functional Impairment, Limitation, Restriction  6: 100% impaired, limited, restricted  7-8: At least 80%, but less than 100% impaired, limited restricted  9-13: At least 60%, but less than 80% impaired, limited restricted  14-19: At least 40%, but less than 60% impaired, limited restricted  20-22: At least 20%, but less than 40% impaired, limited restricted  23: At least 1%, but less than 20% impaired, limited restricted  24: 0% impaired, limited restricted      Activity Tolerance During Today's Session  Tolerated treatment well    Plan  Planned Frequency of Treatment:  D/C Services for: D/C Services       Planned Interventions:       Post-Discharge Occupational Therapy Recommendations:   Skilled OT services NOT indicated   OT DME Recommendations: Defer to post acute -        GOALS:   Patient and Family Goals: None stated    Prognosis:  Good  Positive Indicators:  Motivation; PLOF; CLOF; Spouse support  Barriers to Discharge: None    Subjective  Current Status Pt rec'd semi-reclined in bed. Pt left seated EOB with all immediate needs met, call-bell within reach, and RN made aware.    Prior Functional Status Independent with ADL and functional mobility without an assistive device. Pt drives and lives  with his wife who can assist as needed. Pt reports that he has been having some mild difficulty with iADLs over the past week or so in which he would feel winded after taking groceries inside or doing any heavy lifting. 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    Septic shock due to Staphylococcus aureus (CMS-HCC) 01/28/2020    Tear of medial meniscus of knee 10/25/2016    Testicular pain, left 01/21/2022    Social History     Tobacco Use    Smoking status: Every Day     Packs/day: 2     Types: Cigarettes     Passive exposure: Current    Smokeless tobacco: Former     Types: Chew   Substance Use Topics    Alcohol use: Yes     Comment: Rarely      Past Surgical History:   Procedure Laterality Date    BONE MARROW BIOPSY & ASPIRATION  01/21/2020         CHG [place], CHEST,REAL TIME  11/20/2020    Procedure: ULTRASOUND, CHEST, REAL TIME WITH IMAGE DOCUMENTATION;  Surgeon: Jerelyn Charles, MD;  Location: BRONCH PROCEDURE LAB [facility];  Service: Pulmonary    IR INSERT PORT AGE GREATER THAN 5 YRS  03/10/2020    IR INSERT PORT AGE GREATER THAN 5 YRS 03/10/2020 [name], MD IMG VIR H&V [facility]    PR BRONCHOSCOPY,DIAGNOSTIC W LAVAGE Bilateral 07/20/2020    Procedure: BRONCHOSCOPY, RIGID OR FLEXIBLE, INCLUDE FLUOROSCOPIC GUIDANCE WHEN PERFORMED; W/BRONCHIAL ALVEOLAR LAVAGE WITH MODERATE SEDATION;  Surgeon: Dellis Filbert, MD;  Location: [facility]ON[facility] PROCEDURE LAB [facility];  Service: Pulmonary    Family History   Problem Relation Age of Onset    Cancer Mother     No Known Problems Father     No Known Problems Sister     No Known Problems Brother     No Known Problems Maternal Aunt     No Known Problems Maternal Uncle     No Known Problems Paternal Aunt     No Known Problems Paternal Uncle     No Known Problems Maternal Grandmother     Cancer Maternal Grandfather     No Known Problems Paternal Grandmother     No Known Problems Paternal Grandfather     Melanoma Neg Hx     Basal cell carcinoma Neg Hx     Squamous cell carcinoma Neg Hx         Bupropion hcl, Cefepime, Ceftaroline fosamil, Dapsone, Onion, Vancomycin analogues, Bismuth subsalicylate, Privigen [immun glob g(igg)-pro-iga 0-50], Furosemide, and Gammagard     Objective Findings  Precautions / Restrictions  Falls precautions, Isolation precautions       Weight Bearing  Non-applicable    Required Braces or Orthoses  Non-applicable    Communication Preference  Verbal    Pain  Pt acknowledged 8/10 pain upon OT entry into room. RN provided IV pain meds prior to any attempted bed/functional mobility. Pt reported that pain slightly  decreased to a 7/10 after some functional mobility. RN made aware and activity adjusted for pt tolerance.    Equipment / Environment  Vascular access (PIV, TLC, Port-a-cath, PICC)    Living Situation  Living Environment: House  Lives With: Spouse  Home Living: One level home, Stairs to enter with rails, Grab bars in shower, Standard height toilet, Tub/shower unit, Shower chair with back, Bedside commode  Rail placement (outside): Bilateral rails  Number of Stairs to Enter (outside): 5  Equipment available at home: Paediatric nurse with back, Bedside Commode     Cognition   Orientation Level:  Oriented x 4   Arousal/Alertness:  Appropriate responses to stimuli   Attention Span:  Appears intact   Memory:  Appears intact   Following Commands:  Follows all commands and directions without difficulty   Safety Judgment:  Good awareness of safety precautions   Awareness of Errors:  Good awareness of errors made   Problem Solving:  Able to problem solve independently    Vision / Hearing   Vision: No acute deficits identified  Vision Comments: Pt denied any acute visual changes  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     Hand Function:  Right Hand Function: Right hand grip strength, ROM and coordination WNL  Left Hand Function: Left hand grip strength, ROM and coordination WNL  Hand Dominance: Unknown    Skin Inspection:  Skin Inspection: Intact where visualized    ROM / Strength:  UE ROM/Strength: Left WFL, Right WFL  LE ROM/Strength: Left WFL, Right WFL    Coordination:  Coordination: WFL    Sensation:  RUE Sensation: RUE intact  LUE Sensation: LUE intact  RLE Sensation: RLE impaired  RLE Sensation Impairment: chronic peripheral neuropathy  LLE Sensation: LLE impaired  LLE Sensation Impairment: chronic peripheral neuropathy  Sensory/ Proprioception/ Stereognosis comments: Pt reports that chronic numbness in toes remains constant, but reports that his fingertip sensation has improved over the past 1-2 years    Balance:  Static Sitting-Level of Assistance: Independent  Dynamic Sitting-Level of Assistance: Archivist Standing-Level of Assistance: Independent  Dynamic Standing - Level of Assistance: Independent  Standing Balance comments: Pt independent with all OOB balance    Functional Mobility  Transfer Assistance Needed: No (Sit<>stand t/f & toilet t/f both independently with no AD)  Bed Mobility Assistance Needed: No (Supine>sit t/f independently)  Ambulation: Pt completed ~30 ft room-level functional mobility independently with no AD, no LOB    ADLs  ADLs: Modified Independent, Independent  IADLs: NT    Vitals / Orthostatics  Vitals/Orthostatics: VSS per tele; Pt satting between 88-91 during all functional mobility and denied any subjective SOB    Medical Staff Made Aware: RN Konrad Felix made aware    Occupational Therapy Session Duration  OT Individual [mins]: 10  OT Co-Treatment [mins]: 17 (w/ Sandria Senter, PT)       I attest that I have reviewed the above information.  Signed: Toy Cookey, OT  Filed 02/07/2022

## 2022-02-07 NOTE — Unmapped (Addendum)
Palliative Care Consult Note    Consultation from Requesting Attending Physician:  Avie Arenas, MD  Service Requesting Consult:  Oncology/Hematology ([facility]DE)  Reason for Consult Request from Attending Physician:  Evaluation of Symptoms  Primary Care Provider:  Dierdre Harness, MD  Primary Oncologist: Dr. Senaida Ores        Assessment/Plan:      SUMMARY:  This 41 y.o. 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        -- [name] is seen in consultation at the request of primary team for symptom evaluation.  -- Communicated with primary team on updates and recommendations. Palliative Care plans to visit the patient again on 11/7      Symptom Assessment and Recommendations:      #Chronic fibromyalgia painlower back/bilateral knee and hip pain  #Acute on chronic R thoracic pain/Costochondritis, etiology not clear but likely related to PE and residual MRSA PNA.  pain is very localized between right lateral ribs (see details in subjective assessment)  Home regimen: OxyContin 10mg  po nightly. This does not offer effective pain relief but he need to try to work during the day and cannot take opioids.  Today severe pain continues. IV dilaudid offers some relief for very short period of time but wears off quickly.   Recommend:  --Continue scheduled tylenol 1000mg  po every 8 hours  --Continue home gabapentin 600mg  in AM and 900mg  in PM  --Discontinue lidocaine patch daily, has not been effective  --Increase hydromorphone from 0.5mg   to 1mg  IV every 3 hours PRN (last 24 hours x 3mg ). This mirrors dosing from his last admission that did offer effective pain relief.  --Trial Tizanidine 2mg  every 8 hours PRN  --Trial voltaren gel QID      # prevention of opioid-induced constipation  Recommendation:  -- Miralax twice daily   -- Senna two tabs nightly     #Low mood as it relates to poor pain control and functional decline  --Follows [name] in [employer]          Goals of Care and Decision Making Assessment and Recommendations:       Prognosis / prognostic understanding:  Not discussed  Decisional capacity at time of visit:  Full  Healthcare Decision Maker if lacks capacity:    HCDM (patient stated preference): Giancarlos, [place] Spouse - [phone]  Advance Directive: no  Code status:   Code Status: Full Code       Current Goals of care:    --Relief of symptom burden      Practical, Emotional, Spiritual Support Recommendations:  -- Follows Dr. Genice Rouge in outpatient palliative care  -- -See note from Thornton Papas, Palliative Care LCSW, for psychosocial support     Counseling and Coordination:    -- Patient shares frustration over severe pain burden. He says           Thank you for this consult. Please page Barbette Merino, Juel Burrow (pager: [phone]) or Palliative Care [phone]) if there are any questions.       Subjective:     OZH:[id number] Parin Bonam is an 41 y.o. male B-ALL, DVT, HTN, COPD, OSA who presented with worsening chest pain and dyspnea with new O2 requirement iso acute PE  in the setting of  known DVT.      Symptom Severity, Assessment and Current Medication / Treatment:  Pain:  Severe pain to  R lower sides of ribs that has been getting worse over the last several days. Knife-like stabbing pain. 9/10. Reduces to 6/10 with IV dilaudid. Sometimes able to reduce pain by putting significant pressure on the spot with his thumb. He has tried managing pain with deep breathing and position changes, neither of which help alleviate the pain. Pain has been unresponsive to oxycontin nightly at home.  He cannot work while on opioids and would like to find alternative ways to treat the pain if possible.  Shortness of breath:  mild sob with exertion. Reports recent dry cough  Nausea:  denies  Constipation: denies  Sleep:poor quality and quantity sleep due to poor pain control  Anxiety/Depression:  Low mood. Feels well supported by wife.  Appetite:  not discussed      Psychosocial situation and relevant past history (medical, family, social):    --Lives in [place]  with his wife, [name].   --Together they have 5 adult children who live nearby  --Works in [employer], [employer] in [place]. Having difficulty maintaining work due to pain.  --Hobbies include woodworking.      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    Onion Anaphylaxis    Vancomycin Analogues Other (See Comments)     Hearing loss with Lasix    Other reaction(s): Other (See Comments)    Hearing loss    lasix    Hearing loss      lasix      Hearing loss with Lasix Other reaction(s): Other (See Comments) Hearing loss lasix    Bismuth Subsalicylate Nausea And Vomiting    Privigen [Immun Glob G(Igg)-Pro-Iga 0-50] Other (See Comments)     03/10/2021 VIG infusion stopped as patient developed rigors, tachycardia and HTN during infusion despite pre-medication; 03/12/21 completed IVIG with premedications and slower rate of infusion    Furosemide Other (See Comments)     With Vancomycin caused hearing loss    Other reaction(s): Other (See Comments)    With Vancomycin caused hearing loss      With Vancomycin caused hearing loss Other reaction(s): Other (See Comments) With Vancomycin caused hearing loss    Gammagard        Medications:  Scheduled Meds:   acetaminophen  1,000 mg Oral Q8H    amlodipine  10 mg Oral Daily    carvediloL  12.5 mg Oral BID    doxycycline  100 mg Oral BID    gabapentin  600 mg Oral Daily    And    gabapentin  900 mg Oral Nightly    hydroCHLOROthiazide  12.5 mg Oral Daily    flu vacc qs2023-24 6mos up(PF)  0.5 mL Intramuscular During hospitalization    ipratropium-albuteroL  3 mL Nebulization Q6H (RT)    lidocaine  1 patch Transdermal Daily    nicotine  1 patch Transdermal Daily    nortriptyline  100 mg Oral Nightly    pantoprazole  40 mg Oral Daily    polyethylene glycol  17 g Oral BID    senna  2 tablet Oral Nightly  sodium chloride  10 mL Intravenous BID    sodium chloride  10 mL Intravenous BID    sodium chloride  10 mL Intravenous BID    spironolactone  25 mg Oral Daily    sulfamethoxazole-trimethoprim  1 tablet Oral 2 times per day on Sun Sat    valACYclovir  500 mg Oral Daily     Continuous Infusions:   heparin 18 Units/kg/hr (02/07/22 0541)     PRN Meds:.heparin (porcine), HYDROmorphone, HYDROmorphone, loperamide, loperamide, ondansetron, white petrolatum-mineral oil-lanolin topical     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    Septic shock due to Staphylococcus aureus (CMS-HCC) 01/28/2020    Tear of medial meniscus of knee 10/25/2016    Testicular pain, left 01/21/2022       Past Surgical History:   Procedure Laterality Date    BONE MARROW BIOPSY & ASPIRATION  01/21/2020         CHG [place], CHEST,REAL TIME  11/20/2020    Procedure: ULTRASOUND, CHEST, REAL TIME WITH IMAGE DOCUMENTATION;  Surgeon: Jerelyn Charles, MD;  Location: [facility];  Service: Pulmonary    IR INSERT PORT AGE GREATER THAN 5 YRS  03/10/2020    IR INSERT PORT AGE GREATER THAN 5 YRS 03/10/2020 [name], MD IMG VIR [facility]    PR BRONCHOSCOPY,DIAGNOSTIC W LAVAGE Bilateral 07/20/2020    Procedure: BRONCHOSCOPY, RIGID OR FLEXIBLE, INCLUDE FLUOROSCOPIC GUIDANCE WHEN PERFORMED; W/BRONCHIAL ALVEOLAR LAVAGE WITH MODERATE SEDATION;  Surgeon: Dellis Filbert, MD;  Location: [facility];  Service: Pulmonary Objective:       Function:  50% - Ambulation: Mainly sit/lie / Unable to do any work, extensive disease / Self-Care:Considerable assistance required, Intake: Normal or reduced / Level of Conscious: Full or confusion    Temp:  [35.6 ??C (96.1 ??F)-36.5 ??C (97.7 ??F)] 36.4 ??C (97.5 ??F)  Heart Rate:  [80-96] 92  SpO2 Pulse:  [85-87] 85  Resp:  [18-24] 20  BP: (124-188)/(60-109) 140/80  SpO2:  [90 %-95 %] 90 %    Physical Exam:  Constitutional: Pleasant middle aged male. Wincing/grimacing with movements.  Eyes: anicteric sclera  ENMT: moist oral mucosa  Pulm:  No increased work of breathing or use of accessory muscles.  Abd:  Soft, round.  MS: Normal range of motion. No sarcopenia  Extremities: +2 BLE edema R>L  Skin:  Warm/dry.  Neuro: cognitive status:  A/O x 3  muscle strength: 5/5 bilaterally  Psych: Attentive. Speech fluent and repetition intact. Mood appropriate; no evidence of disordered thinking    Test Results:  Lab Results   Component Value Date    WBC 5.2 02/07/2022    RBC 5.32 02/07/2022    HGB 15.8 02/07/2022    HCT 46.2 02/07/2022    MCV 86.9 02/07/2022    MCH 29.7 02/07/2022    MCHC 34.2 02/07/2022    RDW 15.8 (H) 02/07/2022    PLT 168 02/07/2022    MPV 8.4 02/07/2022     Lab Results   Component Value Date    NA 139 02/07/2022    K 4.3 02/07/2022    CL 104 02/07/2022    CO2 26.0 02/07/2022    BUN 20 02/07/2022    CREATININE 2.00 (H) 02/07/2022  GLU 99 02/07/2022    CALCIUM 9.0 02/07/2022    ALBUMIN 4.2 02/07/2022    PHOS 4.8 02/07/2022      Lab Results   Component Value Date    ALKPHOS 87 02/07/2022    BILITOT 0.4 02/07/2022    BILIDIR 0.20 02/07/2022    PROT 6.8 02/07/2022    ALBUMIN 4.2 02/07/2022    ALT 56 (H) 02/07/2022    AST 33 02/07/2022       Imaging: reviewed in Epic      I personally spent 60 minutes face-to-face and non-face-to-face in the care of this patient, which includes all pre, intra, and post visit time on the date of service.  All documented time was specific to the E/M visit and does not include any procedures that may have been performed.     See ACP Note from today for additional billable service:  No.      Barbette Merino, Palliative Care AGNP

## 2022-02-07 NOTE — Unmapped (Signed)
error 

## 2022-02-07 NOTE — Unmapped (Signed)
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)    Red blood cell antibody positive    Moderate episode of recurrent major depressive disorder (CMS-HCC)    PTSD (post-traumatic stress disorder)    Anxiety    Insomnia    Immunocompromised (CMS-HCC)    Hypogammaglobulinemia (CMS-HCC)    ALL (acute lymphoblastic leukemia) (CMS-HCC)    Chronic pain    COPD (chronic obstructive pulmonary disease) (CMS-HCC)    Acute lymphoblastic leukemia in remission (CMS-HCC)    Incarcerated umbilical hernia    Late effect of fracture of multiple bones    Low back pain    Migraine    Obesity    Pain in joint involving ankle and foot    Panic attacks    CKD (chronic kidney disease)    Hypersensitivity    Bilateral lower extremity edema    Popliteal DVT (deep venous thrombosis) (CMS-HCC)    Hypertension    Acute hypoxic respiratory failure (CMS-HCC)    Subacute pulmonary embolism (CMS-HCC)        Assessment/Plan: Adam Keith is an 41 y.o. 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    AHRF - Acute PE - DVT: Patient admitted to OSH 11/30 with shortness of breath and dry cough, found to have small nonocclusive PE in the right upper lobe.  Patient developed this PE while on Eliquis for recent DVT (12/2021).  Patient was placed on heparin drip at OSH, IVC filter considered but deferred, discharged on Lovenox twice daily.  Patient represented to [facility] 11/4 with worsening shortness of breath, persistent dry cough, and one fever at home.  At discharge from OSH, patient was on oxygen, but at [facility] he has a new oxygen requirement of 3L.  Oxygen requirement is most likely in the setting of PE combined with known COPD (not using inhalers regularly) and untreated OSA, however it is possible that patient has acquired a URI as he has been in and out of hospitals recently.  He is also immunocompromise in the setting of Ponatinib use, development of bacterial infection also possible.  On admission, patient afebrile and hemodynamically stable. No leukocytosis on CBC.  Started on heparin GGT, remains on oxygen.  TTE sent to evaluate for right heart strain, CT chest without contrast sent to evaluate for possible pulmonary infection, full RPP sent to assess for URIs.  Will consider pros and cons of IVC filter, consider ordering upper extremity PVLs to assess utility of IVC filter placement.  - Bcx 11/5: drawn  - full RPP: ordered   - heparin ggt, daily aPTT   - TTE  - CT chest wo contrast  - consider upper extremity PVLs to assess utility of IVC filter     COPD - Tobacco Use Disorder: Patient has known COPD in the setting of tobacco use, does not use inhalers at home. COVID infection 10/2021. Expiratory wheeze on exam.  Untreated COPD is likely contributing to poor respiratory status, however do not believe patient is having a COPD exacerbation at this time, as his cough is nonproductive.  Additionally he does not have a history of hospitalization for COPD exacerbations. Plan to  order scheduled DuoNebs to improve respiratory status from a COPD perspective.  - Duo Nebs scheduled q6h  - nicotine patch    Pre B-ALL: Follows with Dr. Senaida Ores. 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 Krusei), MRSA bacteremia w/ septic emboli c/b acute kidney failure and respiratory failure requiring CRRT and intubation. Treated with Dasatinib, difficulty tolerating due to edema. S/p C4 Ponatinib-Blinatumomab. Remains on ponatinib monotherapy 01/2021. Has decided against transplant. Recent restaging shows remission.  Prophylactic medications continued on admission  - Treatment: Ponatinib   - ppx: valtrex, doxycycline (hx MRSA), bactrim   - daily CBC w/ dif    Chronic Pain - Costochondritis: patient has chronic pain iso fibromyalgia and malignancy. Follows with Dr. Remus Loffler. Endorsed intermittent R flank pain consistent with costochondritis.    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   MDD - Anxiety: nortriptyline continued, hold mirtazapine   GERD: home PPI continued       Immunocompromised status: Patient is immunocompromised secondary to disease or chemotherapy  -Antimicrobial prophylaxis as above     Impending Electrolyte Abnormality Secondary to Chemotherapy and/or IV Fluids  -Daily Electrolyte monitoring  -Replete per [facility] guidelines.       HPI: Adam Keith is an 41 y.o. male B-ALL, DVT, HTN, COPD, OSA who presented with worsening chest pain and dyspnea with new O2 requirement iso acute PE.     Patient was recently admitted to main ED 10/18 - 10/21 for testicular pain.    Presented to [facility] health 11/3 with acute hypoxic respiratory failure, found to have small PE while on Eliquis.  Patient has known DVT.  Admitted for possible IVC placement, however IVC placement deferred.  Patient discharged on Lovenox twice daily and instructed to follow-up outpatient with his oncologist.    Patient has continued to have dyspnea at home and intermittent chest pain, chest pain is on the right lower side of his ribs and has been present on and off for the past year, worse when he is uncomfortable or sick.  Per wife, patient desatted to the 70s overnight, however he has OSA and does not use a CPAP.  He was satting in the 80s at home, and had 1 fever.  When he presented to [facility] ED, he now has a new O2 requirement and is on 3 L.  Has had a dry cough for several days, nonproductive, no hematemesis.  Has felt lightheaded at home.    Has COPD, does not use inhaler regularly because it takes bad.  Does not use oxygen at baseline.  No known hospitalizations for COPD exacerbations.    Patient is a smoker, smokes approximately 2 packs/day.  Has not been able to smoke for the last day because he cannot inhale properly.    Has been on Ponatinib at home for his cancer.         Review of Systems: All positive and pertinent negatives are noted in the HPI; otherwise all other systems are negative  Oncologic History:   Primary Oncologist: Senaida Ores  Oncology History Overview Note   Referring/Local Oncologist: None    Diagnosis:Ph+ ALL    Genetics:    Karyotype/FISH:Abnormal Karyotype: 46,XY,t(9;22)(q34;q11.2)[1]/45,XY,der(7;9)(q10;q10)t(9;22)(q34;q11.2),der(22)t(9;22)[11]/46,sdl,+der(22)t(9;22)[5]/46,XY[3]     Abnormal FISH: A BCR/ABL1 interphase FISH assay shows an abnormal signal pattern in 97% of the 100 cells scored. 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  The findings support a diagnosis of leukemia and have implications for targeted therapy and for monitoring residual disease.      Molecular Genetics:  BCR-ABL1 p210 transcripts were detected at a level of 46.479 IS% ratio in bone marrow.  BCR-ABL1 p190 transcripts were detected at a level of 4 in 100,000 cells in bone marrow.    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)     01/24/2020 - 04/02/2020 Chemotherapy    IP/OP LEUKEMIA GRAAPH-2005 + RITUXIMAB < 60 YO  rituximab hypercvad (odd and even course)     02/24/2020 Remission    CR with PCR MRD+; marrow showing 70% blasts with <1% blasts, negative flow MRD, BCR-ABL p210 PCR 0.197%; CNS negative for blasts     03/09/2020 Progression    CSF - rare blast ID'ed - IT chemo given    03/13/20 - IT chemo, CSF negative     04/16/2020 Biopsy    BM Bx with 40-50% cellularity, <1% blasts, MRD flow cytometry negative, BCR-ABL p210 transcripts 0.036%     05/28/2020 - 05/28/2020 Chemotherapy    OP AML - CNS THERAPY (INTRATHECAL CYTARABINE, INTRATHECAL METHOTREXATE, OR INTRATHECAL TRIPLE)  Select one of the following: cytarabine IT 100 mg with hydrocortisone 50 mg, cytarabine IT 40 mg with methotrexate 15 mg with hydrocortisone 50 mg, OR methotrexate IT 12 mg with hydrocortisone 50 mg     06/19/2020 -  Chemotherapy    IP/OP LEUKEMIA BLINATUMOMAB 7-DAY INFUSION (MINIMAL RESIDUAL DISEASE; WT >= 22 KG) (HOME INFUSION)      Cycles 1*-4: Blinatumomab 28 mcg/day Days 1-28 of 6-week cycle.  *Given in the inpatient setting on Days 1-3 on Cycle 1 and Days 1-2 on Cycle 2, while other treatment days are given in the outpatient setting.    Cycle 1 MRD Dosing     07/18/2020 Adverse Reaction    Hospitalization: fevers. Pneumonia     07/23/2020 Biopsy    Diagnosis  Bone marrow, right iliac, aspiration and biopsy  -   Normocellular bone marrow (50%) with trilineage hematopoiesis and 1% blasts by manual aspirate differential  -   Flow cytometry MRD analysis reveals no definitive immunophenotypic evidence of residual B lymphoblastic leukemia   - BCR-ABL p210 0.006%         08/10/2020 -  Chemotherapy    Cycle 2 blinatumomab-ponatinib  Ponatinib 30mg     1 IT per cycle     09/21/2020 Adverse Reaction    Covid-19, symptomatic but not requiring hospitalization.     10/06/2020 -  Chemotherapy    Cycle 3 blinatumomab-ponatinib  Ponatinib 30mg        11/01/2020 Adverse Reaction    Hospitalization: MRSA Pneumonia requiring intubation    Blinatumomab stopped.  Ponatinib held until he stabilized.       12/14/2020 -  Chemotherapy    Cycle 4 blinatumomab 28 mcg/day days 1-28, ponatinib 30 mg per day IT triple therapy day 29     01/13/2021 -  Chemotherapy    Ponatinib 30mg  daily  - due to due low level BCR-ABL     03/06/2021 Adverse Reaction    Hospitalization - ICU on ventilator - influenza and MRSA/klebsiella PNA     06/13/2022 -  Chemotherapy    OP LEUKEMIA VINCRISTINE  vinCRIStine 2 mg IV on day 1     ALL (acute lymphoblastic leukemia) (CMS-HCC)   06/11/2020 -  Chemotherapy    IP/OP LEUKEMIA BLINATUMOMAB 7-DAY INFUSION (MINIMAL RESIDUAL DISEASE; WT >= 22 KG) (HOME INFUSION)  Cycles 1*-4: Blinatumomab 28 mcg/day Days 1-28 of 6-week cycle.  *Given in the inpatient setting on Days 1-3 on Cycle 1 and Days 1-2 on Cycle 2, while other treatment days are given in the outpatient setting.     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  *Given in the inpatient setting on Days 1-3 on Cycle 1 and Days 1-2 on Cycle 2, while other treatment days are given in the outpatient setting.     11/09/2020 Initial Diagnosis    Acute lymphoblastic leukemia in remission (CMS-HCC)     06/13/2022 -  Chemotherapy    OP LEUKEMIA VINCRISTINE  vinCRIStine 2 mg IV on day 1         Medical History:  PCP: Dierdre Harness, MD  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    Septic shock due to Staphylococcus aureus (CMS-HCC) 01/28/2020    Tear of medial meniscus of knee 10/25/2016    Testicular pain, left 01/21/2022    Surgical History:  Past Surgical History:   Procedure Laterality Date    BONE MARROW BIOPSY & ASPIRATION  01/21/2020         CHG [place], CHEST,REAL TIME  11/20/2020    Procedure: ULTRASOUND, CHEST, REAL TIME WITH IMAGE DOCUMENTATION;  Surgeon: Jerelyn Charles, MD;  Location: [facility]ON[facility] PROCEDURE LAB [facility];  Service: Pulmonary    IR INSERT PORT AGE GREATER THAN 5 YRS  03/10/2020    IR INSERT PORT AGE GREATER THAN 5 YRS 03/10/2020 [name], MD IMG VIR H&V [facility]    PR BRONCHOSCOPY,DIAGNOSTIC W LAVAGE Bilateral 07/20/2020    Procedure: BRONCHOSCOPY, RIGID OR FLEXIBLE, INCLUDE FLUOROSCOPIC GUIDANCE WHEN PERFORMED; W/BRONCHIAL ALVEOLAR LAVAGE WITH MODERATE SEDATION;  Surgeon: Dellis Filbert, MD;  Location: [facility] PROCEDURE LAB [facility];  Service: Pulmonary      Social History:  Social History     Socioeconomic History    Marital status: Married   Tobacco Use    Smoking status: Every Day     Packs/day: 2     Types: Cigarettes     Passive exposure: Current    Smokeless tobacco: Former     Types: Sports administrator  Vaping Use    Vaping Use: Never used   Substance and Sexual Activity    Alcohol use: Yes     Comment: Rarely    Drug use: Never   Other Topics Concern    Do you use sunscreen? No    Tanning bed use? No    Are you easily burned? Yes    Excessive sun exposure? No    Blistering sunburns? Yes     Social Determinants of Health     Financial Resource Strain: Low Risk  (03/08/2021)    Overall Financial Resource Strain (CARDIA)     Difficulty of Paying Living Expenses: Not very hard   Food Insecurity: No Food Insecurity (03/08/2021)    Hunger Vital Sign     Worried About Running Out of Food in the Last Year: Never true     Ran Out of Food in the Last Year: Never true   Transportation Needs: No Transportation Needs (03/08/2021)    PRAPARE - Therapist, art (Medical): No     Lack of Transportation (Non-Medical): No      Family History:   family history includes Cancer in his maternal grandfather and mother; No Known Problems in his brother, father, maternal aunt, maternal grandmother, maternal uncle, paternal aunt, paternal grandfather, paternal grandmother, paternal uncle, and sister.       Allergies: is allergic to bupropion hcl, cefepime, ceftaroline fosamil, dapsone, onion, vancomycin analogues, bismuth subsalicylate, privigen [immun glob g(igg)-pro-iga 0-50], furosemide, and gammagard.    Medications:   Meds:   [START ON 02/07/2022] amlodipine  10 mg Oral Daily    carvediloL  12.5 mg Oral BID    doxycycline  100 mg Oral BID    [START ON 02/07/2022] gabapentin  600 mg Oral Daily    And    gabapentin  900 mg Oral Nightly    [START ON 02/07/2022] hydroCHLOROthiazide  12.5 mg Oral Daily    ipratropium-albuteroL  3 mL Nebulization Q6H (RT)    lidocaine  1 patch Transdermal Daily    nicotine  1 patch Transdermal Daily    nortriptyline  100 mg Oral Nightly    [START ON 02/07/2022] pantoprazole  40 mg Oral Daily    polyethylene glycol  17 g Oral BID    senna  2 tablet Oral Nightly    sodium chloride  10 mL Intravenous BID    sodium chloride  10 mL Intravenous BID    sodium chloride  10 mL Intravenous BID    [START ON 02/07/2022] spironolactone  25 mg Oral Daily    sulfamethoxazole-trimethoprim  1 tablet Oral 2 times per day on Sun Sat    [START ON 02/07/2022] valACYclovir  500 mg Oral Daily     Continuous Infusions:   heparin 19 Units/kg/hr (02/06/22 1619)     PRN Meds:.acetaminophen, heparin (porcine), HYDROmorphone, loperamide, loperamide, oxyCODONE **OR** oxyCODONE, white petrolatum-mineral oil-lanolin topical    Objective:   Vitals: Temp:  [36.4 ??C (97.5 ??F)-36.5 ??C (97.7 ??F)] 36.5 ??C (97.7 ??F)  Heart Rate:  [81-96] 81  SpO2 Pulse:  [85-87] 85  Resp:  [18-24] 19  BP: (124-176)/(84-109) 124/84  MAP (mmHg):  [95] 95  SpO2:  [91 %-93 %] 92 %    Physical Exam  Constitutional:       General: He is not in acute distress.     Appearance: He is obese. He is not ill-appearing or diaphoretic.   HENT:      Head: Normocephalic  and atraumatic.      Nose: No congestion or rhinorrhea.   Cardiovascular:      Rate and Rhythm: Normal rate and regular rhythm.      Heart sounds: No murmur heard.  Pulmonary:      Effort: No respiratory distress.      Breath sounds: Examination of the right-upper field reveals wheezing. Examination of the right-middle field reveals wheezing. Wheezing (expiratory wheeze worse on right) present.      Comments: On 3L [place]  Chest:      Chest wall: Tenderness (R lower chest) present.   Abdominal:      General: There is no distension.      Palpations: Abdomen is soft.   Musculoskeletal:         General: No swelling.   Skin:     Coloration: Skin is not jaundiced.   Neurological:      General: No focal deficit present.      Mental Status: Mental status is at baseline.             Test Results  Recent Labs     02/06/22  1348   WBC 7.2   NEUTROABS 4.6   HGB 15.1   PLT 189     Recent Labs     02/06/22  1348   NA 138   K 4.3   CL 105   CO2 25.0   BUN 20   CREATININE 1.77*   CALCIUM 9.1       Imaging:     DVT PPX Indicated: yes,  treatment dose heparin ggt    Need for PT:   Anticipated Discharge: Their home    Code Status: @PATFPLSTATCODE @   Full Code    HCDM Wife     I personally spent 40 minutes face-to-face and non-face-to-face in the care of this patient, which includes all pre, intra, and post visit time on the date of service.  All documented time was specific to the E/M visit and does not include any procedures that may have been performed.    Jiles Prows, MD  Internal Medicine, PGY-1

## 2022-02-07 NOTE — Unmapped (Signed)
Patient oriented to unit and room. Wife at bedside and pt belongings at bedside. Patient A&Ox4, VSS and afebrile. At start of shift patient's BP elevated, pt c/o 10/10 excruciating pain but otherwise denies headache or chest pain - MD aware, pain meds given along with scheduled coreg. BP improved. New PIV placed by VAT team in right forearm. Heparin gtt continued. Safe environment maintained, call bell within reach. Wife at bedside. Contact/droplet precautions maintained.     Problem: Adult Inpatient Plan of Care  Goal: Plan of Care Review  Outcome: Ongoing - Unchanged  Goal: Patient-Specific Goal (Individualized)  Outcome: Ongoing - Unchanged  Goal: Absence of Hospital-Acquired Illness or Injury  Outcome: Ongoing - Unchanged  Intervention: Identify and Manage Fall Risk  Recent Flowsheet Documentation  Taken 02/07/2022 0050 by Jairo Ben, RN  Safety Interventions:   fall reduction program maintained   family at bedside   lighting adjusted for tasks/safety   low bed   nonskid shoes/slippers when out of bed  Taken 02/06/2022 2248 by Jairo Ben, RN  Safety Interventions:   fall reduction program maintained   family at bedside   lighting adjusted for tasks/safety   low bed   nonskid shoes/slippers when out of bed   isolation precautions  Taken 02/06/2022 2041 by Jairo Ben, RN  Safety Interventions:   fall reduction program maintained   lighting adjusted for tasks/safety   low bed   family at bedside   nonskid shoes/slippers when out of bed   isolation precautions  Intervention: Prevent Skin Injury  Recent Flowsheet Documentation  Taken 02/07/2022 0050 by Jairo Ben, RN  Positioning for Skin: Right  Taken 02/06/2022 2248 by Jairo Ben, RN  Positioning for Skin: Supine/Back  Taken 02/06/2022 2041 by Jairo Ben, RN  Positioning for Skin: Supine/Back  Goal: Optimal Comfort and Wellbeing  Outcome: Ongoing - Unchanged  Goal: Readiness for Transition of Care  Outcome: Ongoing - Unchanged     Problem: VTE (Venous Thromboembolism)  Goal: Tissue Perfusion  Outcome: Ongoing - Unchanged

## 2022-02-07 NOTE — Unmapped (Signed)
VENOUS ACCESS ULTRASOUND PROCEDURE NOTE    Indications:   Poor venous access.    The Venous Access Team has assessed this patient for the placement of a PIV. Ultrasound guidance was necessary to obtain access.     Procedure Details:  Identity of the patient was confirmed via name, medical record number and date of birth. The availability of the correct equipment was verified.    The vein was identified for ultrasound catheter insertion.  Field was prepared with necessary supplies and equipment.  Probe cover and sterile gel utilized.  Insertion site was prepped with chlorhexidine solution and allowed to dry.  The catheter extension was primed with normal saline.A(n) 22 gauge 1.75 catheter was placed in the R Forearm with 1attempt(s). See LDA for additional details.    Catheter aspirated, 3 mL blood return present. The catheter was then flushed with 10 mL of normal saline. Insertion site cleansed, and dressing applied per manufacturer guidelines. The catheter was inserted with difficulty due to poor vasculatureby Melodye Ped, RN.      RN was notified.     Thank you,     Melodye Ped, RN Venous Access Team   [phone]     Workup / Procedure Time:  15 minutes    See vein image in PACs.

## 2022-02-07 NOTE — Unmapped (Signed)
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    AHRF - Rhinovirus: presented with dyspnea and dry cough, new O2 requirement since discharge from OSH 11/4. RPP 11/5 positive for rhinovirus. No acute changes on CT chest. Plan to continue supportive care, duo nebsf or COPD, and wean O2 as tolerated.  - Bcx 11/5: NG at 24 hrs  - Supportive Care  - wean O2 as tolerated  - COPD and PE treatment below     Pulmonary Embolism - DVT: DVT found 12/2021, patient on Eliquis. Presented to OSH 11/3 with dyspnea found to have small nonocclusive PE. OSH decided against IVC filter, discharged on Lovenox twice daily. Clotting risk factors include ponatinib, tobacco use, obesity. Need to determine if patient failed Eliquis and if he has any additional clotting risk factors. APLS panel ordered. Would be useful to know how acute this PE is, although that will be difficult to determine. D-dimer ordered. TTE ordered to assess for R heart strain. LE PVLs ordered to assess clot burden. Continue heparin ggt.   - heparin ggt, daily aPTT  - TTE   - D-dimer  - APLS panel  - LE PVLs     Chronic Pain - c/f Costochondritis vs Nerve Damage: patient has chronic pain iso fibromyalgia and malignancy. Previous chest tube insertion at site of pain. Follows with Dr. Remus Loffler. Endorsed intermittent R flank pain consistent with costochondritis. Patient in acute severe pain. Consulting palliative to assist with pain management.   - lidocaine patches   - home nortriptyline continued   - home gabapentin continued   - home dilaudid 2 mg PO daily continued   - oxycodone prn  - tylenol prn  - Bowel Reg: sch miralax, senna   - Palliative consult, follow up recs      COPD - Tobacco Use Disorder: No home O2 requirement. Patient has known COPD in the setting of tobacco use, does not use inhalers at home. COVID infection 10/2021.  Untreated COPD is likely contributing to poor respiratory status, however do not believe patient is having a COPD exacerbation at this time, as his cough is nonproductive.  Additionally he does not have a history of hospitalization for COPD exacerbations. 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 Krusei), MRSA bacteremia w/ septic emboli c/b acute kidney failure and respiratory failure requiring CRRT and intubation. Treated with Dasatinib, difficulty tolerating due to edema. S/p C4 Ponatinib-Blinatumomab. Remains on ponatinib monotherapy 01/2021. Has decided against transplant. Recent restaging shows remission.  Prophylactic medications continued on admission.   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   MDD - Anxiety: nortriptyline continued, hold mirtazapine   GERD: home PPI continued     Impending Electrolyte Abnormality Secondary to Chemotherapy and/or IV Fluids  -Daily Electrolyte monitoring  -Replete per [facility] guidelines.     Immunocompromised status: Patient is immunocompromised secondary to disease or chemotherapy  -Antimicrobial prophylaxis as above      Nutrition:                        Subjective:     Interval History: admitted 11/5 for PE and pain. Struggled with R flank pain overnight, only responded to IV dilaudid. Remains on O2, weaned to 2L.     10 point ROS otherwise negative except as above in the HPI.     Objective:     Vital signs in last 24 hours:  Temp:  [35.6 ??C (96.1 ??F)-36.5 ??C (97.7 ??F)] 36.4 ??C (97.5 ??F)  Heart Rate:  [80-92] 92  SpO2 Pulse:  [85-87] 85  Resp:  [19-22] 20  BP: (124-188)/(60-106) 140/80  MAP (mmHg):  [87-133] 103  SpO2:  [90 %-95 %] 90 %    Intake/Output last 3 shifts:  No intake/output data recorded.    Meds:  Current Facility-Administered Medications   Medication Dose Route Frequency Provider Last Rate Last Admin    acetaminophen (TYLENOL) tablet 1,000 mg  1,000 mg Oral Q8H Cyruss Arata, Francine Graven, MD   1,000 mg at 02/07/22 1114    amlodipine (NORVASC) tablet 10 mg  10 mg Oral Daily Marja Kays, MD   10 mg at 02/07/22 0824    carvediloL (COREG) tablet 12.5 mg  12.5 mg Oral BID Marja Kays, MD   12.5 mg at 02/07/22 1610    doxycycline (VIBRA-TABS) tablet 100 mg  100 mg Oral BID Marja Kays, MD   100 mg at 02/07/22 9604    gabapentin (NEURONTIN) capsule 600 mg  600 mg Oral Daily Marja Kays, MD   600 mg at 02/07/22 5409    And    gabapentin (NEURONTIN) capsule 900 mg  900 mg Oral Nightly Marja Kays, MD   900 mg at 02/06/22 2041    heparin (porcine) 1000 unit/mL injection 5,000 Units  5,000 Units Intravenous Q6H PRN Marja Kays, MD   5,000 Units at 02/06/22 1624    heparin 25,000 Units/250 mL (100 units/mL) in 5% dextrose infusion  0-24 Units/kg/hr (Adjusted) Intravenous Continuous Marja Kays, MD 18.68 mL/hr at 02/07/22 0541 18 Units/kg/hr at 02/07/22 0541    hydroCHLOROthiazide (HYDRODIURIL) tablet 12.5 mg  12.5 mg Oral Daily Marja Kays, MD   12.5 mg at 02/07/22 8119    HYDROmorphone (DILAUDID) tablet 2 mg  2 mg Oral Q4H PRN Marja Kays, MD HYDROmorphone (PF) injection Syrg 0.5 mg  0.5 mg Intravenous Q3H PRN Derek Jack A, MD   0.5 mg at 02/07/22 1525    HYDROmorphone (PF) injection Syrg 0.5 mg  0.5 mg Intravenous Once PRN Marja Kays, MD   0.5 mg at 02/07/22 1325    influenza vaccine quad (FLUARIX, FLULAVAL, FLUZONE) (6 MOS & UP) 2023-24  0.5 mL Intramuscular During hospitalization Zeidner, Windell Norfolk, MD        ipratropium-albuteroL (DUO-NEB) 0.5-2.5 mg/3  mL nebulizer solution 3 mL  3 mL Nebulization Q6H (RT) Marja Kays, MD   3 mL at 02/07/22 1114    lidocaine 4 % patch 1 patch  1 patch Transdermal Daily Marja Kays, MD   1 patch at 02/07/22 1610    loperamide (IMODIUM) capsule 2 mg  2 mg Oral Q2H PRN Marja Kays, MD        loperamide (IMODIUM) capsule 4 mg  4 mg Oral Once PRN Marja Kays, MD        nicotine (NICODERM CQ) 14 mg/24 hr patch 1 patch  1 patch Transdermal Daily Marja Kays, MD        nortriptyline (PAMELOR) capsule 100 mg  100 mg Oral Nightly Marja Kays, MD   100 mg at 02/06/22 2041    ondansetron (ZOFRAN-ODT) disintegrating tablet 4 mg  4 mg Oral Q6H PRN Winfred Leeds, MD        pantoprazole (PROTONIX) EC tablet 40 mg  40 mg Oral Daily Marja Kays, MD   40 mg at 02/07/22 0824    polyethylene glycol (MIRALAX) packet 17 g  17 g Oral BID Marja Kays, MD        senna Mancel Parsons) tablet 2 tablet  2 tablet Oral Nightly Marja Kays, MD        sodium chloride (NS) 0.9 % flush 10 mL  10 mL Intravenous BID Marja Kays, MD   10 mL at 02/06/22 2115    sodium chloride (NS) 0.9 % flush 10 mL  10 mL Intravenous BID Marja Kays, MD        sodium chloride (NS) 0.9 % flush 10 mL  10 mL Intravenous BID Marja Kays, MD   10 mL at 02/07/22 0825    spironolactone (ALDACTONE) tablet 25 mg  25 mg Oral Daily Marja Kays, MD   25 mg at 02/07/22 9604    sulfamethoxazole-trimethoprim (BACTRIM DS) 800-160 mg tablet 160 mg of trimethoprim  1 tablet Oral 2 times per day on Sun Sat Marja Kays, MD   160 mg of trimethoprim at 02/06/22 2041    valACYclovir (VALTREX) tablet 500 mg  500 mg Oral Daily Marja Kays, MD   500 mg at 02/07/22 0824    white petrolatum-mineral oil-lanolin topical cream   Topical Daily PRN Marja Kays, MD           Physical Exam  Constitutional:       General: He is not in acute distress.     Appearance: He is obese. He is not ill-appearing.   HENT:      Head: Normocephalic and atraumatic.   Cardiovascular:      Rate and Rhythm: Normal rate and regular rhythm.      Heart sounds: No murmur heard.  Pulmonary:      Effort: No respiratory distress.      Breath sounds: Decreased air movement present. Examination of the right-upper field reveals wheezing. Examination of the right-middle field reveals wheezing. Wheezing present.   Abdominal:      General: There is no distension.      Tenderness: There is no abdominal tenderness.   Musculoskeletal:      Right lower leg: Edema present.      Left lower leg: Edema present.   Skin:     Coloration: Skin is not jaundiced.      Findings: No bruising.   Neurological:  General: No focal deficit present.      Mental Status: He is alert. Mental status is at baseline.   Psychiatric:         Mood and Affect: Mood normal.         Behavior: Behavior normal.             Labs:  Recent Labs     02/06/22  1348 02/07/22  0736   WBC 7.2 5.2   NEUTROABS 4.6 2.5   LYMPHSABS 1.6 2.0   HGB 15.1 15.8   HCT 43.6 46.2   PLT 189 168   CREATININE 1.77* 2.00*   BUN 20 20   BILITOT 0.4 0.4   BILIDIR  --  0.20   AST 31 33   ALT 53* 56*   ALKPHOS 85 87   K 4.3 4.3   MG  --  2.0   CALCIUM 9.1 9.0   NA 138 139   CL 105 104   CO2 25.0 26.0   PHOS  --  4.8       Imaging:  CT Chest   Impression:       1. Unchanged consolidation with volume loss in the right upper lobe, favored to or present residual scarring from prior infection.    2. Similar/slight increase in a small focus of groundglass opacification in the medial segment of the middle lobe of the right lung, which is favored to be infectious/laboratory. However, there is no new consolidative airspace disease.    3. Similar unusual calcifications associated with the myocardium of the left ventricle.    4. Debris within the proximal esophagus correlates with poor esophageal clearing and/or gastroesophageal reflux       Echo: pending     Marja Kays, MD, PGY-1  02/07/2022  3:41 PM   Hematology/Oncology Department   [facility]   Group pager: [phone]

## 2022-02-07 NOTE — Unmapped (Signed)
PHYSICAL THERAPY  Evaluation (02/07/22 1107)          Patient Name:  Adam Keith       Medical Record Number: [id number]   Date of Birth: 26-Jul-1980  Sex: Male        Treatment Diagnosis: decreased endurance     Activity Tolerance: Tolerated treatment well     ASSESSMENT  Problem List: Shortness of breath, Decreased endurance      Assessment : Kamaurion Dedes is an 41 y.o. 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     Emrey presents to acute PT services with decreased endurance and new oxygen requirements but remains independent with functional mobility. He has no further PT needs at this time. After a review of the personal factors, comorbidities, clinical presentation, and examination of the number of affected body systems, the patient presents as a low complexity case.      Today's Interventions: PT Eval, pt education re: PT role, POC, activity pacing, importance of monitoring O2 sats, OOB to chair, pursed lip breathing                        PLAN  Planned Frequency of Treatment:  D/C Services for: D/C Services       Planned Interventions:       Post-Discharge Physical Therapy Recommendations:  PT Post Acute Discharge Recommendations: Skilled PT services NOT indicated      PT DME Recommendations: None            Goals:   Patient and Family Goals: return to work                                  Prognosis:  Excellent  Positive Indicators: age, participation, PLOF, CLOF, caregiver support  Barriers to Discharge: None     SUBJECTIVE  Patient reports: Pt agreeable to PT, has been ambulating to the bathroom  Current Functional Status: session began with pt in bed, ended with pt sitting EOB, needs in reach, RN aware     Prior Functional Status: Pt is IND, no use of DME or hx of falls. Is back to work but will likely take a little time off after this hospitalization. reports he is unable to run 2/2 his neuropathy but otherwise has no restrictions. Also does woodworking- gets short of breath carrying wood from his truck but otherwise is able to work for 4-5 hours at a time.  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    Septic shock due to Staphylococcus aureus (CMS-HCC) 01/28/2020    Tear of medial meniscus of knee 10/25/2016    Testicular pain, left 01/21/2022            Social History     Tobacco Use    Smoking status: Every Day     Packs/day: 2     Types: Cigarettes     Passive exposure: Current    Smokeless tobacco: Former     Types: Chew   Substance Use Topics  Alcohol use: Yes     Comment: Rarely       Past Surgical History:   Procedure Laterality Date    BONE MARROW BIOPSY & ASPIRATION  01/21/2020         CHG [place], CHEST,REAL TIME  11/20/2020    Procedure: ULTRASOUND, CHEST, REAL TIME WITH IMAGE DOCUMENTATION;  Surgeon: Jerelyn Charles, MD;  Location: [facility]ON[facility] PROCEDURE LAB [facility];  Service: Pulmonary    IR INSERT PORT AGE GREATER THAN 5 YRS  03/10/2020    IR INSERT PORT AGE GREATER THAN 5 YRS 03/10/2020 [name], MD IMG VIR [facility]    PR BRONCHOSCOPY,DIAGNOSTIC W LAVAGE Bilateral 07/20/2020    Procedure: BRONCHOSCOPY, RIGID OR FLEXIBLE, INCLUDE FLUOROSCOPIC GUIDANCE WHEN PERFORMED; W/BRONCHIAL ALVEOLAR LAVAGE WITH MODERATE SEDATION;  Surgeon: Dellis Filbert, MD;  Location: BRON[facility] PROCEDURE LAB [facility];  Service: Pulmonary             Family History   Problem Relation Age of Onset    Cancer Mother     No Known Problems Father     No Known Problems Sister     No Known Problems Brother     No Known Problems Maternal Aunt     No Known Problems Maternal Uncle     No Known Problems Paternal Aunt     No Known Problems Paternal Uncle     No Known Problems Maternal Grandmother     Cancer Maternal Grandfather     No Known Problems Paternal Grandmother     No Known Problems Paternal Grandfather     Melanoma Neg Hx     Basal cell carcinoma Neg Hx     Squamous cell carcinoma Neg Hx         Allergies: Bupropion hcl, Cefepime, Ceftaroline fosamil, Dapsone, Onion, Vancomycin analogues, Bismuth subsalicylate, Privigen [immun glob g(igg)-pro-iga 0-50], Furosemide, and Gammagard                  Objective Findings  Precautions / Restrictions  Precautions: Falls precautions, Isolation precautions  Weight Bearing Status: Non-applicable  Required Braces or Orthoses: Non-applicable     Communication Preference: Verbal          Pain Comments: reported 8/10 posterior rib/flank pain, RN in room to provide IV pain medication. Pt reported 7/10 pain at same location for remainder of session. RN aware  Medical Tests / Procedures: chart reviewed  Equipment / Environment: Vascular access (PIV, TLC, Port-a-cath, PICC)     Vitals/Orthostatics : spO2 89% on 3L at rest, 88-91% on 3L with activity. RN made aware     Living Situation  Living Environment: House  Lives With: Spouse  Home Living: One level home, Stairs to enter with rails, Grab bars in shower, Standard height toilet, Tub/shower unit, Shower chair with back, Bedside commode  Rail placement (outside): Bilateral rails  Number of Stairs to Enter (outside): 5      Cognition: WFL  Visual/Perception: Within Functional Limits           Upper Extremities  UE Strength: Right WFL, Left WFL    Lower Extremities  LE Strength: Right WFL, Left WFL     Sensation: Impaired  Motor/Sensory/Neuro Comments: pt with decreased sensation in all toes which is chronic. Intermittent neuropathy in fingertips but largely resolved.    Static Sitting-Level of Assistance: Independent  Dynamic Sitting-Level of Assistance: Archivist Standing-Level of Assistance: Independent  Dynamic Standing - Level of Assistance: Independent  Bed Mobility: Supine to Sit  Supine to Sit assistance level: Independent     Transfers: Sit to Stand  Sit to Stand assistance level: Independent      Gait Level of Assistance: Independent  Gait Distance Ambulated (ft): 60 ft  Gait: Pt ambulated independently at a moderate pace     Stairs: not assessed            Endurance: Good but below baseline     Physical Therapy Session Duration  PT Individual [mins]: 10  PT Co-Treatment [mins]: 20  Reason for Co-treatment: Poor pain control     Medical Staff Made Aware: RN Katelyn     I attest that I have reviewed the above information.  Signed: Salome Holmes, PT  Filed 02/07/2022

## 2022-02-08 LAB — APTT
APTT: 62.2 s — ABNORMAL HIGH (ref 24.8–38.4)
HEPARIN CORRELATION: 0.4

## 2022-02-08 LAB — BASIC METABOLIC PANEL
ANION GAP: 8 mmol/L (ref 5–14)
BLOOD UREA NITROGEN: 23 mg/dL (ref 9–23)
BUN / CREAT RATIO: 12
CALCIUM: 8.8 mg/dL (ref 8.7–10.4)
CHLORIDE: 104 mmol/L (ref 98–107)
CO2: 27 mmol/L (ref 20.0–31.0)
CREATININE: 1.95 mg/dL — ABNORMAL HIGH
EGFR CKD-EPI (2021) MALE: 43 mL/min/{1.73_m2} — ABNORMAL LOW (ref >=60)
GLUCOSE RANDOM: 101 mg/dL (ref 70–179)
POTASSIUM: 4 mmol/L (ref 3.4–4.8)
SODIUM: 139 mmol/L (ref 135–145)

## 2022-02-08 LAB — CBC W/ AUTO DIFF
BASOPHILS ABSOLUTE COUNT: 0.1 10*9/L (ref 0.0–0.1)
BASOPHILS RELATIVE PERCENT: 0.9 %
EOSINOPHILS ABSOLUTE COUNT: 0.3 10*9/L (ref 0.0–0.5)
EOSINOPHILS RELATIVE PERCENT: 3.8 %
HEMATOCRIT: 41.9 % (ref 39.0–48.0)
HEMOGLOBIN: 14.6 g/dL (ref 12.9–16.5)
LYMPHOCYTES ABSOLUTE COUNT: 2 10*9/L (ref 1.1–3.6)
LYMPHOCYTES RELATIVE PERCENT: 22.4 %
MEAN CORPUSCULAR HEMOGLOBIN CONC: 34.8 g/dL (ref 32.0–36.0)
MEAN CORPUSCULAR HEMOGLOBIN: 29.9 pg (ref 25.9–32.4)
MEAN CORPUSCULAR VOLUME: 85.8 fL (ref 77.6–95.7)
MEAN PLATELET VOLUME: 8.7 fL (ref 6.8–10.7)
MONOCYTES ABSOLUTE COUNT: 0.8 10*9/L (ref 0.3–0.8)
MONOCYTES RELATIVE PERCENT: 8.6 %
NEUTROPHILS ABSOLUTE COUNT: 5.6 10*9/L (ref 1.8–7.8)
NEUTROPHILS RELATIVE PERCENT: 64.3 %
PLATELET COUNT: 206 10*9/L (ref 150–450)
RED BLOOD CELL COUNT: 4.89 10*12/L (ref 4.26–5.60)
RED CELL DISTRIBUTION WIDTH: 15.6 % — ABNORMAL HIGH (ref 12.2–15.2)
WBC ADJUSTED: 8.7 10*9/L (ref 3.6–11.2)

## 2022-02-08 LAB — MAGNESIUM: MAGNESIUM: 1.9 mg/dL (ref 1.6–2.6)

## 2022-02-08 LAB — PHOSPHORUS: PHOSPHORUS: 4 mg/dL (ref 2.4–5.1)

## 2022-02-08 MED ORDER — APIXABAN 5 MG TABLET
ORAL_TABLET | Freq: Two times a day (BID) | ORAL | 2 refills | 30 days | Status: CP
Start: 2022-02-08 — End: ?
  Filled 2022-02-08: qty 60, 30d supply, fill #0

## 2022-02-08 MED ORDER — HYDROMORPHONE 2 MG TABLET
ORAL_TABLET | Freq: Two times a day (BID) | ORAL | 0 refills | 14 days | Status: CP | PRN
Start: 2022-02-08 — End: 2022-02-22
  Filled 2022-02-08: qty 28, 14d supply, fill #0

## 2022-02-08 MED ORDER — TIZANIDINE 2 MG TABLET
ORAL_TABLET | Freq: Three times a day (TID) | ORAL | 0 refills | 10 days | Status: CN | PRN
Start: 2022-02-08 — End: ?

## 2022-02-08 MED ORDER — POLYETHYLENE GLYCOL 3350 17 GRAM ORAL POWDER PACKET
PACK | Freq: Two times a day (BID) | ORAL | 0 refills | 30 days | Status: CN
Start: 2022-02-08 — End: 2022-03-10

## 2022-02-08 MED ORDER — SENNOSIDES 8.6 MG TABLET
ORAL_TABLET | Freq: Every evening | ORAL | 0 refills | 30 days | Status: CN
Start: 2022-02-08 — End: 2022-03-10

## 2022-02-08 MED ORDER — NICOTINE 14 MG/24 HR DAILY TRANSDERMAL PATCH
MEDICATED_PATCH | Freq: Every day | TRANSDERMAL | 0 refills | 28 days | Status: CN
Start: 2022-02-08 — End: ?

## 2022-02-08 MED ADMIN — acetaminophen (TYLENOL) tablet 1,000 mg: 1000 mg | ORAL | @ 14:00:00 | Stop: 2022-02-08

## 2022-02-08 MED ADMIN — HYDROmorphone (DILAUDID) tablet 2 mg: 2 mg | ORAL | @ 16:00:00 | Stop: 2022-02-08

## 2022-02-08 MED ADMIN — pantoprazole (PROTONIX) EC tablet 40 mg: 40 mg | ORAL | @ 14:00:00 | Stop: 2022-02-08

## 2022-02-08 MED ADMIN — heparin 25,000 Units/250 mL (100 units/mL) in 5% dextrose infusion: 0-24 [IU]/kg/h | INTRAVENOUS | @ 02:00:00

## 2022-02-08 MED ADMIN — HYDROmorphone (PF) (DILAUDID) injection 1 mg: 1 mg | INTRAVENOUS | @ 11:00:00 | Stop: 2022-02-08

## 2022-02-08 MED ADMIN — carvediloL (COREG) tablet 12.5 mg: 12.5 mg | ORAL | @ 02:00:00

## 2022-02-08 MED ADMIN — HYDROmorphone (PF) (DILAUDID) injection 1 mg: 1 mg | INTRAVENOUS | @ 06:00:00 | Stop: 2022-02-08

## 2022-02-08 MED ADMIN — nortriptyline (PAMELOR) capsule 100 mg: 100 mg | ORAL | @ 02:00:00

## 2022-02-08 MED ADMIN — gabapentin (NEURONTIN) capsule 600 mg: 600 mg | ORAL | @ 14:00:00 | Stop: 2022-02-08

## 2022-02-08 MED ADMIN — carvediloL (COREG) tablet 12.5 mg: 12.5 mg | ORAL | @ 14:00:00 | Stop: 2022-02-08

## 2022-02-08 MED ADMIN — HYDROmorphone (PF) (DILAUDID) injection 1 mg: 1 mg | INTRAVENOUS | @ 03:00:00 | Stop: 2022-02-21

## 2022-02-08 MED ADMIN — doxycycline (VIBRA-TABS) tablet 100 mg: 100 mg | ORAL | @ 14:00:00 | Stop: 2022-02-08

## 2022-02-08 MED ADMIN — diclofenac sodium (VOLTAREN) 1 % gel 2 g: 2 g | TOPICAL | @ 11:00:00 | Stop: 2022-02-08

## 2022-02-08 MED ADMIN — spironolactone (ALDACTONE) tablet 25 mg: 25 mg | ORAL | @ 14:00:00 | Stop: 2022-02-08

## 2022-02-08 MED ADMIN — ipratropium-albuteroL (DUO-NEB) 0.5-2.5 mg/3 mL nebulizer solution 3 mL: 3 mL | RESPIRATORY_TRACT | @ 11:00:00 | Stop: 2022-02-08

## 2022-02-08 MED ADMIN — sodium chloride (NS) 0.9 % flush 10 mL: 10 mL | INTRAVENOUS | @ 02:00:00

## 2022-02-08 MED ADMIN — ipratropium-albuteroL (DUO-NEB) 0.5-2.5 mg/3 mL nebulizer solution 3 mL: 3 mL | RESPIRATORY_TRACT | @ 03:00:00

## 2022-02-08 MED ADMIN — amlodipine (NORVASC) tablet 10 mg: 10 mg | ORAL | @ 14:00:00 | Stop: 2022-02-08

## 2022-02-08 MED ADMIN — doxycycline (VIBRA-TABS) tablet 100 mg: 100 mg | ORAL | @ 02:00:00 | Stop: 2022-03-08

## 2022-02-08 MED ADMIN — diclofenac sodium (VOLTAREN) 1 % gel 2 g: 2 g | TOPICAL | @ 02:00:00

## 2022-02-08 MED ADMIN — hydroCHLOROthiazide (HYDRODIURIL) tablet 12.5 mg: 12.5 mg | ORAL | @ 14:00:00 | Stop: 2022-02-08

## 2022-02-08 MED ADMIN — apixaban (ELIQUIS) tablet 5 mg: 5 mg | ORAL | @ 17:00:00 | Stop: 2022-02-08

## 2022-02-08 MED ADMIN — sodium chloride (NS) 0.9 % flush 10 mL: 10 mL | INTRAVENOUS | @ 14:00:00 | Stop: 2022-02-08

## 2022-02-08 MED ADMIN — gabapentin (NEURONTIN) capsule 900 mg: 900 mg | ORAL | @ 02:00:00

## 2022-02-08 MED ADMIN — HYDROmorphone (PF) (DILAUDID) injection 1 mg: 1 mg | INTRAVENOUS | @ 17:00:00 | Stop: 2022-02-08

## 2022-02-08 MED ADMIN — ipratropium-albuteroL (DUO-NEB) 0.5-2.5 mg/3 mL nebulizer solution 3 mL: 3 mL | RESPIRATORY_TRACT | @ 16:00:00 | Stop: 2022-02-08

## 2022-02-08 MED ADMIN — valACYclovir (VALTREX) tablet 500 mg: 500 mg | ORAL | @ 14:00:00 | Stop: 2022-02-08

## 2022-02-08 NOTE — Unmapped (Signed)
No acute changes or events. Requiring ~Q3 IV Dilaudid for 8+/10 pain - see MAR. Heparin drip continues, correlation still therapeutic. Wife at bedside. No falls or injuries with all safety measures maintained.     Problem: Adult Inpatient Plan of Care  Goal: Plan of Care Review  Outcome: Progressing  Goal: Patient-Specific Goal (Individualized)  Outcome: Progressing  Goal: Absence of Hospital-Acquired Illness or Injury  Outcome: Progressing  Intervention: Identify and Manage Fall Risk  Recent Flowsheet Documentation  Taken 02/07/2022 2152 by Felipa Furnace, RN  Safety Interventions: family at bedside  Taken 02/07/2022 2030 by Felipa Furnace, RN  Safety Interventions:   lighting adjusted for tasks/safety   low bed  Intervention: Prevent Skin Injury  Recent Flowsheet Documentation  Taken 02/07/2022 2030 by Felipa Furnace, RN  Positioning for Skin: Supine/Back  Intervention: Prevent Infection  Recent Flowsheet Documentation  Taken 02/07/2022 2030 by Felipa Furnace, RN  Infection Prevention:   cohorting utilized   hand hygiene promoted   rest/sleep promoted  Goal: Optimal Comfort and Wellbeing  Outcome: Progressing  Goal: Readiness for Transition of Care  Outcome: Progressing  Goal: Rounds/Family Conference  Outcome: Progressing     Problem: VTE (Venous Thromboembolism)  Goal: Tissue Perfusion  Outcome: Progressing  Goal: Right Ventricular Function  Outcome: Progressing

## 2022-02-08 NOTE — Unmapped (Addendum)
Pt VSS, afebrile, A/Ox4. Pt received pain meds per MAR. Pt PIV used for blood draw d/t 4 unsuccessful attempts by phlebotomy. Heparin drip continued at 18 units/kg/hr. ECHO done at bedside. PT/OT done in the room. Palliative met with patient; PRN meds added for additional pain support - PRN zanaflex given with limited effect. Pt free of falls or injuries with all safety measures maintained.   Problem: Adult Inpatient Plan of Care  Goal: Plan of Care Review  Outcome: Ongoing - Unchanged  Goal: Patient-Specific Goal (Individualized)  Outcome: Ongoing - Unchanged  Goal: Absence of Hospital-Acquired Illness or Injury  Outcome: Ongoing - Unchanged  Intervention: Identify and Manage Fall Risk  Recent Flowsheet Documentation  Taken 02/07/2022 0815 by Jimmy Picket, RN  Safety Interventions:  ??? low bed  ??? lighting adjusted for tasks/safety  ??? family at bedside  ??? fall reduction program maintained  ??? infection management  ??? nonskid shoes/slippers when out of bed  ??? room near unit station  Intervention: Prevent Skin Injury  Recent Flowsheet Documentation  Taken 02/07/2022 0815 by Jimmy Picket, RN  Positioning for Skin: Right  Skin Protection:  ??? adhesive use limited  ??? cleansing with dimethicone incontinence wipes  Intervention: Prevent Infection  Recent Flowsheet Documentation  Taken 02/07/2022 0815 by Jimmy Picket, RN  Infection Prevention:  ??? hand hygiene promoted  ??? personal protective equipment utilized  ??? rest/sleep promoted  ??? single patient room provided  Goal: Optimal Comfort and Wellbeing  Outcome: Ongoing - Unchanged  Goal: Readiness for Transition of Care  Outcome: Ongoing - Unchanged  Goal: Rounds/Family Conference  Outcome: Ongoing - Unchanged     Problem: Infection  Goal: Absence of Infection Signs and Symptoms  Outcome: Ongoing - Unchanged  Intervention: Prevent or Manage Infection  Recent Flowsheet Documentation  Taken 02/07/2022 0815 by Jimmy Picket, RN  Infection Management: aseptic technique maintained  Isolation Precautions: contact precautions maintained     Problem: VTE (Venous Thromboembolism)  Goal: Tissue Perfusion  Outcome: Ongoing - Unchanged  Goal: Right Ventricular Function  Outcome: Ongoing - Unchanged     Problem: Wound  Goal: Optimal Coping  Outcome: Ongoing - Unchanged  Goal: Optimal Functional Ability  Outcome: Ongoing - Unchanged  Intervention: Optimize Functional Ability  Recent Flowsheet Documentation  Taken 02/07/2022 0815 by Jimmy Picket, RN  Activity Management: ambulated in room  Goal: Absence of Infection Signs and Symptoms  Outcome: Ongoing - Unchanged  Intervention: Prevent or Manage Infection  Recent Flowsheet Documentation  Taken 02/07/2022 0815 by Jimmy Picket, RN  Infection Management: aseptic technique maintained  Isolation Precautions: contact precautions maintained  Goal: Improved Oral Intake  Outcome: Ongoing - Unchanged  Goal: Optimal Pain Control and Function  Outcome: Ongoing - Unchanged  Goal: Skin Health and Integrity  Outcome: Ongoing - Unchanged  Intervention: Optimize Skin Protection  Recent Flowsheet Documentation  Taken 02/07/2022 0815 by Jimmy Picket, RN  Activity Management: ambulated in room  Pressure Reduction Techniques: frequent weight shift encouraged  Head of Bed (HOB) Positioning: HOB elevated  Pressure Reduction Devices: pressure-redistributing mattress utilized  Skin Protection:  ??? adhesive use limited  ??? cleansing with dimethicone incontinence wipes  Goal: Optimal Wound Healing  Outcome: Ongoing - Unchanged

## 2022-02-09 LAB — CULTURE, BLOOD (ROUTINE X 2)
Culture: NO GROWTH
Culture: NO GROWTH
Special Requests: ADEQUATE

## 2022-02-09 NOTE — Unmapped (Signed)
Palliative Care Consult Note    Consultation from Requesting Attending Physician:  Avie Arenas, MD  Service Requesting Consult:  Oncology/Hematology (MDE)  Reason for Consult Request from Attending Physician:  Evaluation of Symptoms  Primary Care Provider:  Dierdre Harness, MD  Primary Oncologist: Dr. Senaida Ores        Assessment/Plan:      SUMMARY:  This 41 y.o. 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     11/7 update:  Pain control not optimized, however, primary team feels he is medically stable for discharge. Adam Keith desires to go home with the understanding that his will leave on home regimen of nightly oxycontin. His goal is to  minimize opioid use at home so he can continue to work.  Discussed recommendations with primary team.  Patient has outpatient oncology follow up with Dr. Genice Rouge on 11/30.  As goals of care are established, we will sign off on Adam Keith at this time. Please do not hesitate to re-consult if any new issues or concerns arise.       Symptom Assessment and Recommendations:      #Chronic fibromyalgia painlower back/bilateral knee and hip pain  #Acute on chronic R thoracic pain/Costochondritis, etiology not clear but likely related to PE and residual MRSA PNA.  pain is very localized between right lateral ribs (see details in subjective assessment)  Moderate to severe pain continues despite frequent IV dilaudid. Patient wants to go home and feels he can manage on his home regimen: OxyContin 10mg  po nightly.   Recommend:  --Continue scheduled tylenol 1000mg  po every 8 hours  --Continue home gabapentin 600mg  in AM and 900mg  in PM  --Continue voltaren gel QID, mildly effective  --Resume oxycontin 10mg  po nightly, home med, following discharge  --Discontinue hydromorphone 1mg  IV every 3 hours PRN (last 24 hours x 5mg ) following discharge  --Discontinue Tizanidine 2mg  every 8 hours PRN, not effective  -lidocaine patched discontinued, not effective        # prevention of opioid-induced constipation  Recommendation:  -- Miralax twice daily   -- Senna two tabs nightly     #Low mood as it relates to poor pain control and functional decline  --Follows [name] in [employer] [employer]          Goals of Care and Decision Making Assessment and Recommendations:       Prognosis / prognostic understanding:  Not discussed  Decisional capacity at time of visit:  Full  Healthcare Decision Maker if lacks capacity:    HCDM (patient stated preference): Adam Keith, Adam Keith - [phone]  Advance Directive: no  Code status:   Code Status: Full Code       Current Goals of care:    --Relief of symptom burden      Practical, Emotional, Spiritual Support Recommendations:  -- Follows Dr. Genice Rouge in outpatient palliative care. Next appointment 11/30.  -- -See note from Thornton Papas, Palliative Care LCSW, for psychosocial support     Counseling and Coordination:    -- Patient shares frustration over severe pain burden. He says           Thank you for this consult. Please page Barbette Merino, Juel Burrow (pager: [phone]) or Palliative Care [phone]) if there are any questions.       Subjective:     Interval event:   Following discussion with primary team, who  feels he is medically optimized from a clot perspective, patient requests to go home.  Pain control not optimized but he wants to minimize opioid use at home so he can continue to work.   -He denies nausea, constipation, dyspnea  -Ambulating in room independently  -wife remains at bedside for support.    Psychosocial situation and relevant past history (medical, family, social):    --Lives in [place]  with his wife, Adam Keith.   --Together they have 5 adult children who live nearby  --Works in [employer], [employer] in [place]. Having difficulty maintaining work due to pain.  --Hobbies include woodworking.      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    Onion Anaphylaxis    Vancomycin Analogues Other (See Comments)     Hearing loss with Lasix    Other reaction(s): Other (See Comments)    Hearing loss    lasix    Hearing loss      lasix      Hearing loss with Lasix Other reaction(s): Other (See Comments) Hearing loss lasix    Bismuth Subsalicylate Nausea And Vomiting    Privigen [Immun Glob G(Igg)-Pro-Iga 0-50] Other (See Comments)     03/10/2021 VIG infusion stopped as patient developed rigors, tachycardia and HTN during infusion despite pre-medication; 03/12/21 completed IVIG with premedications and slower rate of infusion    Furosemide Other (See Comments)     With Vancomycin caused hearing loss    Other reaction(s): Other (See Comments)    With Vancomycin caused hearing loss      With Vancomycin caused hearing loss Other reaction(s): Other (See Comments) With Vancomycin caused hearing loss    Gammagard        Medications:  Scheduled Meds:   acetaminophen  1,000 mg Oral Q8H    amlodipine  10 mg Oral Daily    apixaban  5 mg Oral BID    carvediloL  12.5 mg Oral BID    diclofenac sodium  2 g Topical QID    doxycycline  100 mg Oral BID    gabapentin  600 mg Oral Daily    And    gabapentin  900 mg Oral Nightly    hydroCHLOROthiazide  12.5 mg Oral Daily    flu vacc qs2023-24 6mos up(PF)  0.5 mL Intramuscular During hospitalization    ipratropium-albuteroL  3 mL Nebulization Q6H (RT)    nicotine  1 patch Transdermal Daily    nortriptyline  100 mg Oral Nightly    pantoprazole  40 mg Oral Daily    polyethylene glycol  17 g Oral BID    senna  2 tablet Oral Nightly    sodium chloride  10 mL Intravenous BID sodium chloride  10 mL Intravenous BID    sodium chloride  10 mL Intravenous BID    spironolactone  25 mg Oral Daily    sulfamethoxazole-trimethoprim  1 tablet Oral 2 times per day on Sun Sat    valACYclovir  500 mg Oral Daily     Continuous Infusions:      PRN Meds:.guaiFENesin, HYDROmorphone, HYDROmorphone, loperamide, loperamide, ondansetron,  tizanidine, white petrolatum-mineral oil-lanolin topical     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    Septic shock due to Staphylococcus aureus (CMS-HCC) 01/28/2020    Tear of medial meniscus of knee 10/25/2016    Testicular pain, left 01/21/2022       Past Surgical History:   Procedure Laterality Date    BONE MARROW BIOPSY & ASPIRATION  01/21/2020         CHG [place], CHEST,REAL TIME  11/20/2020    Procedure: ULTRASOUND, CHEST, REAL TIME WITH IMAGE DOCUMENTATION;  Surgeon: Jerelyn Charles, MD;  Location: [facility];  Service: Pulmonary    IR INSERT PORT AGE GREATER THAN 5 YRS  03/10/2020    IR INSERT PORT AGE GREATER THAN 5 YRS 03/10/2020 [name], MD IMG VIR [facility]    PR BRONCHOSCOPY,DIAGNOSTIC W LAVAGE Bilateral 07/20/2020    Procedure: BRONCHOSCOPY, RIGID OR FLEXIBLE, INCLUDE FLUOROSCOPIC GUIDANCE WHEN PERFORMED; W/BRONCHIAL ALVEOLAR LAVAGE WITH MODERATE SEDATION;  Surgeon: Dellis Filbert, MD;  Location: [facility];  Service: Pulmonary         Objective:       Function:  50% - Ambulation: Mainly sit/lie / Unable to do any work, extensive disease / Self-Care:Considerable assistance required, Intake: Normal or reduced / Level of Conscious: Full or confusion    Temp:  [36.4 ??C (97.6 ??F)-37 ??C (98.6 ??F)] 36.7 ??C (98 ??F)  Heart Rate:  [79-95] 87  Resp:  [18-20] 20  BP: (128-146)/(77-82) 128/79  SpO2:  [89 %-94 %] 94 %    Physical Exam:  Constitutional: Pleasant middle aged male. Sitting on couch in room. Wife at bedside for support  Pulm:  No increased work of breathing or use of accessory muscles.  MS: Normal range of motion. No sarcopenia  Extremities: +1BLE edema R>L  Skin:  Warm/dry.  Neuro: cognitive status:  A/O x 3  muscle strength: 5/5 bilaterally  Psych: Appears in better spirits today compared to 11/6    Test Results:  Lab Results   Component Value Date    WBC 8.7 02/08/2022    RBC 4.89 02/08/2022    HGB 14.6 02/08/2022    HCT 41.9 02/08/2022    MCV 85.8 02/08/2022    MCH 29.9 02/08/2022    MCHC 34.8 02/08/2022    RDW 15.6 (H) 02/08/2022    PLT 206 02/08/2022    MPV 8.7 02/08/2022     Lab Results   Component Value Date    NA 139 02/08/2022    K 4.0 02/08/2022    CL 104 02/08/2022    CO2 27.0 02/08/2022    BUN 23 02/08/2022    CREATININE 1.95 (H) 02/08/2022    GLU 101 02/08/2022    CALCIUM 8.8 02/08/2022    ALBUMIN 4.2 02/07/2022    PHOS 4.0 02/08/2022      Lab Results   Component Value Date    ALKPHOS 87 02/07/2022    BILITOT 0.4 02/07/2022    BILIDIR 0.20 02/07/2022    PROT 6.8 02/07/2022    ALBUMIN 4.2 02/07/2022    ALT 56 (H) 02/07/2022    AST 33 02/07/2022       Imaging: reviewed in Epic      I personally  spent 30 minutes face-to-face and non-face-to-face in the care of this patient, which includes all pre, intra, and post visit time on the date of service.  All documented time was specific to the E/M visit and does not include any procedures that may have been performed.     See ACP Note from today for additional billable service:  No.      Barbette Merino, Palliative Care AGNP

## 2022-02-09 NOTE — Unmapped (Signed)
Physician Discharge Summary [facility]  4 ONC [facility]  [address]  [place] [address]  Dept: [phone]  Loc: [phone]     Identifying Information:   Adam Keith  [birth year]  [id number]    Primary Care Physician: Dierdre Harness, MD     Referring Physician: Referred Self     Code Status: Full Code    Admit Date: 02/06/2022    Discharge Date: 02/08/2022     Discharge To: Home    Discharge Service: [facility] - Hematology Res Floor Team (MED E - Tower)     Discharge Attending Physician: No att. 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 *      Outpatient Provider Follow Up Issues:   Follow-up Plan after discharge:    Issues related to hospitalization: Anticoagulation  Patient's primary oncologist and/or nurse navigator: Dr. Senaida Ores    Supportive Care Recommendations:  We recommend based on the patient???s underlying diagnosis and treatment history the following supportive care:    1. Antimicrobial prophylaxis:  Other: Bactrim and Valtrex    Hospital Course:   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     AHRF - Rhinovirus  Presented with dyspnea and dry cough, new O2 requirement since discharge from OSH 11/4. RPP 11/5 positive for rhinovirus. No acute changes on CT chest. Continued supportive care, PE treatment, and COPD treatment. Able to tolerate ambulation test on room air for home oxygen evaluation.     Pulmonary Embolism - DVT  DVT found 12/2021, patient started on Eliquis. Presented to OSH 11/3 with dyspnea found to have small nonocclusive PE. OSH decided against IVC filter, discharged on 11/4 on Lovenox twice daily. At [facility] patient started on heparin ggt. PVLs returned WNL. APLS panel is pending. Patient was discharged back on eliquis based on the normal PVL and d-dimer findings, with less concern for new clot and possible inconsistent dosing of eliquis previously.     Chronic Pain - c/f Costochondritis vs Nerve Damage  Patient has chronic pain iso fibromyalgia and malignancy. Previous chest tube insertion at site of pain. Follows with Dr. Remus Loffler. Palliative consulted to assist with pain management, started muscle relaxer but not found to be effective.  At discharge, was restarted on home medications.     COPD - Tobacco Use Disorder  No home O2 requirement. Patient has known COPD in the setting of tobacco use, does not use inhalers at home. COVID infection 10/2021. Scheduled duo nebs during hospitalization. Discharged with anoro epllipta.     Pre B-ALL  Follows with Dr. Senaida Ores. 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 Krusei), MRSA bacteremia w/ septic emboli c/b acute kidney failure and respiratory failure requiring CRRT and intubation. Treated with Dasatinib, difficulty tolerating due to edema. S/p C4 Ponatinib-Blinatumomab. Remains on ponatinib monotherapy 01/2021. Has decided against transplant. Recent  restaging shows remission.  Prophylactic medications continued on admission.      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   MDD - Anxiety: nortriptyline continued, hold mirtazapine   GERD: home PPI continued             Procedures:  Echocardiogram   No admission procedures for hospital encounter.  ______________________________________________________________________  Discharge Medications:     Your Medication List        STOP taking these medications      enoxaparin 150 mg/mL injection  Commonly known as: LOVENOX            START taking these medications      ELIQUIS 5 mg Tab  Generic drug: apixaban  Take 1 tablet (5 mg total) by mouth two (2) times a day.            CHANGE how you take these medications      HYDROmorphone 2 MG tablet  Commonly known as: DILAUDID  Take 1 tablet (2 mg total) by mouth two (2) times a day as needed for severe pain for up to 14 days.  What changed:   when to take this  reasons to take this            CONTINUE taking these medications      amlodipine 10 MG tablet  Commonly known as: NORVASC  Take 1 tablet (10 mg total) by mouth daily.     buprenorphine 5 mcg/hour Ptwk transdermal patch  Place 1 patch on the skin every seven (7) days for 28 days.     carvediloL 12.5 MG tablet  Commonly known as: COREG  Take 1 tablet (12.5 mg total) by mouth Two (2) times a day.     docusate sodium 100 MG capsule  Commonly known as: COLACE  Take 1 capsule (100 mg total) by mouth daily.     doxycycline 100 MG tablet  Commonly known as: VIBRA-TABS  Take 1 tablet (100 mg total) by mouth Two (2) times a day.     gabapentin 300 MG capsule  Commonly known as: NEURONTIN  Take 2 capsules (600mg ) by mouth in the morning and 3 capsules (900mg ) at bedtime. May cause drowsiness.     hydroCHLOROthiazide 12.5 MG tablet  Commonly known as: HYDRODIURIL  Take 1 tablet (12.5 mg total) by mouth daily.     ICLUSIG 30 mg tablet  Generic drug: PONATinib  Take 1 tablet (30 mg total) by mouth daily. Swallow tablets whole. Do not crush, break, cut or chew tablets.     mirtazapine 15 MG tablet  Commonly known as: REMERON  TAKE 1 TABLET BY MOUTH EVERY DAY AT NIGHT     nortriptyline 50 MG capsule  Commonly known as: PAMELOR  Take 2 capsules (100 mg total) by mouth nightly.     omeprazole 40 MG capsule  Commonly known as: PriLOSEC  Take 1 capsule (40 mg total) by mouth every morning.     oxyCODONE 10 mg Tr12 12 hr crush resistant ER/CR tablet  Commonly known as: OxyCONTIN  Take 1 tablet (10 mg total) by mouth nightly for 28 days.     spironolactone 25 MG tablet  Commonly known as: ALDACTONE  Take 1 tablet (25 mg total) by mouth daily.     sulfamethoxazole-trimethoprim 800-160 mg per tablet  Commonly known as: BACTRIM  DS  TAKE 1 TABLET BY MOUTH 2 TIMES A DAY ON SATURDAY, SUNDAY. FOR PROPHYLAXIS WHILE ON CHEMO.     umeclidinium-vilanteroL 62.5-25 mcg/actuation inhaler  Commonly known as: ANORO ELLIPTA  Inhale 1 puff daily.     valACYclovir 500 MG tablet  Commonly known as: VALTREX  Take 1 tablet (500 mg total) by mouth daily.     WRIST BRACE Misc  Generic drug: arm brace  1 Piece by Miscellaneous route as needed. left ulnar wrist brace              Allergies:  Bupropion hcl, Cefepime, Ceftaroline fosamil, Dapsone, Onion, Vancomycin analogues, Bismuth subsalicylate, Privigen [immun glob g(igg)-pro-iga 0-50], Furosemide, and Gammagard  ______________________________________________________________________  Pending Test Results (if blank, then none):  Pending Labs       Order Current Status    ANTIPHOSPHOLIPID PANEL In process    Beta-2 Glycoprotein Antibodies In process    Cardiolipin Antibody, IgM/IgG In process    LUPUS INHIBITOR PANEL In process    Blood Culture #1 Preliminary result    Blood Culture #2 Preliminary result            Most Recent Labs:  All lab results last 24 hours -   Recent Results (from the past 24 hour(s))   APTT    Collection Time: 02/08/22  5:58 AM   Result Value Ref Range    APTT 62.2 (H) 24.8 - 38.4 sec    Heparin Correlation 0.4    Basic Metabolic Panel    Collection Time: 02/08/22  5:58 AM   Result Value Ref Range    Sodium 139 135 - 145 mmol/L    Potassium 4.0 3.4 - 4.8 mmol/L    Chloride 104 98 - 107 mmol/L    CO2 27.0 20.0 - 31.0 mmol/L    Anion Gap 8 5 - 14 mmol/L    BUN 23 9 - 23 mg/dL Creatinine 8.11 (H) 9.14 - 1.18 mg/dL    BUN/Creatinine Ratio 12     eGFR CKD-EPI (2021) Male 43 (L) >=60 mL/min/1.19m2    Glucose 101 70 - 179 mg/dL    Calcium 8.8 8.7 - 78.2 mg/dL   Magnesium Level    Collection Time: 02/08/22  5:58 AM   Result Value Ref Range    Magnesium 1.9 1.6 - 2.6 mg/dL   Phosphorus Level    Collection Time: 02/08/22  5:58 AM   Result Value Ref Range    Phosphorus 4.0 2.4 - 5.1 mg/dL   CBC w/ Differential    Collection Time: 02/08/22  5:58 AM   Result Value Ref Range    WBC 8.7 3.6 - 11.2 10*9/L    RBC 4.89 4.26 - 5.60 10*12/L    HGB 14.6 12.9 - 16.5 g/dL    HCT 95.6 21.3 - 08.6 %    MCV 85.8 77.6 - 95.7 fL    MCH 29.9 25.9 - 32.4 pg    MCHC 34.8 32.0 - 36.0 g/dL    RDW 57.8 (H) 46.9 - 15.2 %    MPV 8.7 6.8 - 10.7 fL    Platelet 206 150 - 450 10*9/L    Neutrophils % 64.3 %    Lymphocytes % 22.4 %    Monocytes % 8.6 %    Eosinophils % 3.8 %    Basophils % 0.9 %    Absolute Neutrophils 5.6 1.8 - 7.8 10*9/L    Absolute Lymphocytes 2.0 1.1 - 3.6 10*9/L    Absolute Monocytes 0.8  0.3 - 0.8 10*9/L    Absolute Eosinophils 0.3 0.0 - 0.5 10*9/L    Absolute Basophils 0.1 0.0 - 0.1 10*9/L     Coagulation - Results in Past 2 Days  Result Component Current Result   PT Not in Time Range   INR, POC Not in Time Range   APTT 62.2 (H) (02/08/2022)       Relevant Studies/Radiology (if blank, then none):  PVL Venous Duplex Lower Extremity Bilateral    Result Date: 02/08/2022   Peripheral Vascular Lab     [address]   [place], [place] [address]  PVL VENOUS DUPLEX LOWER EXTREMITY BILATERAL Patient Demographics Pt. Name: LAMARCUS BUSHY Tagliaferro Location: PVL Inpatient Lab MRN:      [id number]            Sex:      M DOB:      1980/09/07            Age:      61 years  Study Information Authorizing         [id number] CAMILLE T      Performed Time       02/08/2022 Provider Name       BEATON                                     10:05:20 AM Ordering Physician  Michiel Sites       Patient Location     [facility] Accession Number [id number] UN         Technologist         Tanja Port RVT Diagnosis:                                Assisting                                           Technologist Ordered Reason For Exam: pulmonary embolism Indication: SOB Risk Factors: Chemotherapy, confirmed PE and Cancer (ALL). Anticoagulation: (Heparin). Protocol The major deep veins from the inguinal ligament to the ankle are assessed for bilaterally for compressibility and color and spectral Doppler flow characteristics. The assessed veins include bilateral common femoral vein, femoral vein in the thigh, popliteal vein, and intramuscular calf veins. The iliac vein is assessed indirectly using Doppler waveform analysis. The great saphenous vein is assessed for compressibility at the saphenofemoral junction, and the small saphenous vein assessed for compressibility behind the knee.  Right Duplex Findings All veins visualized appear fully compressible. Doppler flow signals demonstrate normal spontaneity, phasicity, and augmentation.  Left Duplex Findings All veins visualized appear fully compressible. Doppler flow signals demonstrate normal spontaneity, phasicity, and augmentation.  Right Technical Summary No evidence of deep venous obstruction in the lower extremity. No indirect evidence of obstruction proximal to the inguinal ligament.  Left Technical Summary No evidence of deep venous obstruction in the lower extremity. No indirect evidence of obstruction proximal to the inguinal ligament.  Final Interpretation Right There is no evidence of DVT in the lower extremity. There is no evidence of obstruction proximal to the inguinal ligament or in the common femoral vein. Left There is no evidence of DVT in the lower extremity. There is no evidence of obstruction proximal to the inguinal  ligament or in the common femoral vein.  Electronically signed by [id number] Jodell Cipro MD on 02/08/2022 at 11:13:33 AM.      Echocardiogram Follow Up/Limited Echo With Contrast    Result Date: 02/07/2022  Patient Info Name:     Narvel Kata Age:     41 years DOB:     [birth year] Gender:     Male MRN:     [id number] Accession #:     [id number] UN Account #:     [id number] Ht:     188 cm Wt:     136 kg BSA:     2.72 m2 BP:     140 /     80 mmHg HR:     75 bpm Exam Date:     02/07/2022 2:02 PM Admit Date:     02/06/2022     Exam Type:     ECHOCARDIOGRAM FOLLOW UP/LIMITED ECHO WITH CONTRAST     Technical Quality:     Fair     Staff Sonographer:     Metro Kung Reading Fellow:     Luretha Murphy     Study Info Indications      - PE look for R heart stran ; Definity/Optison Procedure(s)   Limited two-dimensional, color flow and Doppler transthoracic echocardiogram is performed with contrast to opacify the left ventricle and to improve the delineation of the left ventricle endocardial borders.     Ultrasound Enhancing Agent/Agitated Saline     ------------------------------ UEA/Ag. Saline:     Optison Amount:     --- ml IV Access Condition:     patent with no signs of infiltration         Summary   1. Limited study to assess right ventricular function.   2. The left ventricle is normal in size with normal wall thickness.   3. The left ventricular systolic function is normal, LVEF is visually estimated at > 55%.   4. The right ventricle is normal in size, with normal systolic function.         Left Ventricle   The left ventricle is normal in size with normal wall thickness. The left ventricular systolic function is normal, LVEF is visually estimated at > 55%.     Right Ventricle   The right ventricle is normal in size, with normal systolic function.         Left Atrium   The left atrium is normal in size.     Right Atrium   The right atrium is normal in size.         Aortic Valve   The aortic valve is trileaflet with normal appearing leaflets with normal excursion. There is no significant aortic regurgitation.     Mitral Valve   The mitral valve leaflets are normal with normal leaflet mobility. There is no significant mitral valve regurgitation.     Tricuspid Valve   The tricuspid valve leaflets are poorly visualized but probably normal, with normal leaflet mobility. There is no significant tricuspid regurgitation.         Aorta   The aorta is normal in size in the visualized segments.     Inferior Vena Cava   The IVC is not well visualized precluding the ability to accurate assess right atrial pressure.     Pericardium/Pleural   There is no pericardial effusion.         Ventricles ---------------------------------------------------------------------- Name  Value        Normal ----------------------------------------------------------------------     LV Dimensions 2D/MM ---------------------------------------------------------------------- LVID Diastole (2D)                  5.2 cm       4.2-5.8 LVID Systole (2D)                   3.2 cm       2.5-4.0     RV Dimensions 2D/MM ----------------------------------------------------------------------  RV Basal Diastolic Dimension                           4.5 cm       2.5-4.1 TAPSE                               2.8 cm         >=1.7     Atria ---------------------------------------------------------------------- Name                                 Value        Normal ----------------------------------------------------------------------     RA Dimensions ---------------------------------------------------------------------- RA Area (4C)                      16.4 cm2        <=18.0 RA Area (4C) Index              6.0 cm2/m2               RA ESV Index (4C MOD)             15 ml/m2         18-32     Aortic Valve ---------------------------------------------------------------------- Name                                 Value        Normal ----------------------------------------------------------------------     AV Doppler ---------------------------------------------------------------------- AV Peak Velocity 1.3 m/s               AV Peak Gradient                    7 mmHg     Venous ---------------------------------------------------------------------- Name                                 Value        Normal ----------------------------------------------------------------------     IVC/SVC ---------------------------------------------------------------------- IVC Diameter (Exp 2D)               2.1 cm         <=2.1         Report Signatures Finalized by Ardeth Sportsman  MD on 02/07/2022 04:20 PM Resident Kirk Ruths Ng on 02/07/2022 04:08 PM    CT Chest Wo Contrast    Result Date: 02/07/2022  EXAM: CT CHEST WO CONTRAST DATE: 02/07/2022 ACCESSION: [id number] UN DICTATED: 02/07/2022 12:07 AM INTERPRETATION LOCATION: MAIN CAMPUS     CLINICAL INDICATION: 41 years old Male with c/f infection      COMPARISON: CT chest 02/04/2022     TECHNIQUE: Contiguous 0.6 or 1 mm and 2 mm axial images were  reconstructed through the chest following a single breath hold helical acquisition.  Images were reformatted in the axial and sagittal planes.  MIP slabs were also constructed.     FINDINGS:     LUNGS AND AIRWAYS: Unchanged appearance of consolidation and volume loss in the right upper lobe. Similar appearance of right middle lobe round glass opacity. Moderate emphysematous changes. No new consolidation. The central airways are patent.     PLEURA: No pleural fluid or pneumothorax.     MEDIASTINUM AND LYMPH NODES: Unchanged 1.2 cm right paratracheal lymph node. Debris within the proximal esophagus     HEART AND VASCULATURE:The cardiac chambers are normal in size.  There is no pericardial effusion.  Ascending and descending aorta normal in caliber.  Pulmonary artery normal in size. Unchanged left ventricular myocardial calcification.     BONES AND SOFT TISSUES: Symmetric gynecomastia     UPPER ABDOMEN: Unremarkable.     OTHER: No other significant findings.                 1. Unchanged consolidation with volume loss in the right upper lobe, favored to or present residual scarring from prior infection.     2. Similar/slight increase in a small focus of groundglass opacification in the medial segment of the middle lobe of the right lung, which is favored to be infectious/laboratory. However, there is no new consolidative airspace disease.     3. Similar unusual calcifications associated with the myocardium of the left ventricle.     4. Debris within the proximal esophagus correlates with poor esophageal clearing and/or gastroesophageal reflux         ==================== MODIFIED REPORT: (02/07/2022 6:45 AM) This report has been modified from its preliminary version; you may check the prior versions of radiology report, results history link for prior report versions (if they were previously visible in Epic).     -----------------------------------------------    CT Chest Interpretation of Outside film    Result Date: 02/06/2022  CTA chest is presented for interpretation 02/06/2022 3:43 PM.  The study is dated 02/04/2022, was obtained at [facility] and is interpreted at the request of Dr. Sabino Gasser .     EXAM: CT CHEST INTERPRETATION OF OUTSIDE FILM DATE: 02/06/2022 2:59 PM ACCESSION: [id number] UN DICTATED: 02/06/2022 3:42 PM INTERPRETATION LOCATION: MAIN CAMPUS     CLINICAL INDICATION: 41 years old Male with R06.02 - SOB (shortness of breath)      COMPARISON: CT chest 09/22/2021     TECHNIQUE: Outside CTA chest from [facility] dated 02/05/2019. Serial axial CT images of the chest from the thoracic inlet through the hemidiaphragms were obtained after administration of IV contrast.     FINDINGS:     PULMONARY ARTERIES: Nonocclusive, somewhat peripherally located thrombus in the right upper lobe segmental pulmonary artery (6:127). No CT evidence of right heart failure.     AIRWAYS, LUNGS, PLEURA: Mild diffuse bronchial wall thickening. Right upper lobe consolidation and volume loss, similar to previous. Groundglass opacity involving the right middle lobe, improved compared to previous. Upper lung predominant paraseptal and moderate centrilobular emphysema. No pleural effusion.     MEDIASTINUM: Normal heart size left ventricular myocardial calcification, similar to previous. No pericardial effusion.  Normal caliber thoracic aorta. 1.2 cm right paratracheal lymph node (6:104), previously 1.1 cm. Additional prominent but subcentimeter mediastinal lymph nodes.     IMAGED ABDOMEN: Unremarkable.     SOFT TISSUES: Unremarkable.     BONES: Unremarkable.  Nonocclusive thrombus in the right upper lobe apical segmental pulmonary artery. Somewhat eccentric location, suggesting subacute chronicity.     Consolidation with volume loss in the right upper lobe, similar to previous. Finding likely represents residual scarring from prior infection/inflammation. Improvement in groundglass opacity involving the right middle lobe is also likely postinfectious/inflammatory.     Paraseptal and moderate centrilobular emphysema.     1.2 cm right paratracheal lymph node, similar to previous. Indeterminate, recommend continued attention on follow-up.    XR Chest 1 view Portable    Result Date: 02/06/2022  EXAM: XR CHEST PORTABLE DATE: 02/06/2022 1:54 PM ACCESSION: [id number] UN DICTATED: 02/06/2022 1:55 PM INTERPRETATION LOCATION: MAIN CAMPUS     CLINICAL INDICATION: 41 years old Male with CHEST PAIN      TECHNIQUE: Single View AP Chest Radiograph.     COMPARISON: Chest radiograph 12/28/2021     FINDINGS:     Unchanged support devices.     Right apical scarring similar to previous. Low lung volumes. Blunted right costophrenic sulcus may be related to scarring or small right pleural effusion. No pneumothorax.        Unremarkable cardiomediastinal silhouette.             Low lung volumes. Right apical scarring similar previous.     Small right pleural effusion versus scarring.    ECG 12 Lead    Result Date: 02/06/2022  NORMAL SINUS RHYTHM RIGHTWARD AXIS POOR R WAVE PROGRESSION IN PRECORDIAL LEADS WHEN COMPARED WITH ECG OF 20-Jan-2022 21:28, NO SIGNIFICANT CHANGE WAS FOUND Confirmed by Schuyler Amor ([id number]) on 02/06/2022 1:01:11 PM   ______________________________________________________________________  Discharge Instructions:     Activity Instructions       Activity as tolerated                  Other Instructions       Call MD for:  difficulty breathing, headache or visual disturbances      Call MD for:  persistent nausea or vomiting      Call MD for:  severe uncontrolled pain      Call MD for:  temperature >38.5 Celsius      Discharge instructions      It was a pleasure taking care of you!    You were admitted to [facility] with difficulty breathing and cough, and were found to have a cold virus and a blood clot in your lungs.  You were treated with blood thinners and supportive care with improvement.  You were found not to need oxygen prior to discharge.    You are now ready to discharge and will be discharging to: Home    Please take your medications as prescribed. Medication changes are listed below and a full list of medications is in this discharge packet. Please keep your follow-up appointments after the hospital for ongoing care. It has been a pleasure taking care of you, we wish you the best.     MEDICATION CHANGES:  -- Start eliquis 5 mg twice daily  -- Stop lovenox injections  -- Continue OxyContin at night and oral dilaudid as needed for breakthrough pain.  Please talk to Dr. Genice Rouge about starting the buprenorphine patch at your next visit.  -- We recommend taking a bowel regimen to prevent constipation while taking opioids (miralax and senna are available over the counter)    FOLLOW-UP:  -- Please follow up with your oncologist and palliative care doctors at your next appt on 11/30      --------------  When to Call Your [facility] Cancer Care Team:   Monday- Friday from 8:00 am - 5:00 pm: Call [phone] or Toll free [phone].  Ask to speak to the Triage Nurse  On Nights, Weekends and Holidays: Call [phone]. Ask the operator to page the Oncology Fellow on Call     RED ZONE:  Take action now!  You need to be seen right away. Call 911 or go to your nearest hospital for help.   - Symptoms are at a severe level of discomfort    - Bleeding that will not stop  - Chest Pain    - Hard to breathe    - Fall or passing out    - New Seizure    - Thoughts of hurting yourself or others     YELLOW ZONE: Take action today  This is NOT an all-inclusive list. Pleae call with any new or worsening symptoms.   Call your doctor, nurse or other healthcare provider at [phone]  You can be seen by a provider the same day through our Same Day Acute Care for Patients with Cancer program.   - Symptoms are new or worsening; You are not within your goal range for:    - Pain          - Swelling (leg, arm, abdomen, face, neck)    - Shortness of breath        - Skin rash or skin changes    - Bleeding (nose, urine, stool, wound)    - Wound issues (redness, drainage, re-opened)    - Feeling sick to your stomach and throwing up    - Confusion    - Mouth sores/pain in your mouth or throat     - Vision changes   - Hard stool or very loose stools (increase in ostomy   - Fever >100.4 F, chills     Output)        - Worsening cough with mucus that is green, yellow or bloody   - No urine for 12 hours      - Pain or burning when going to the bathroom    - Feeding tube or other catheter/tube issue     - Home infusion pump issue - call [phone]   - Redness or pain at previous IV or port/catheter site    - Depressed or anxiety     GREEN ZONE: You are in control   Your symptoms are under control. Continue to take your medicine as ordered. Keep all visits to the doctor.   - No increase or worsening symptoms   - Able to take your medicine   - Able to drink and eat     For your safety and best care, please DO NOT use MyChart messages to report red or yellow symptoms.   MyChart messages are only checked during weekday normal business hours and you should receive a   Response within 2 business days.   Please use MyChart only for the follows:   - Non-urgent medication refills, scheduling requests or general questions.           Patient Education:     - Wash your hands routinely with soap and water  - Take your temperature when you have chills or are not feeling well  - Use a soft toothbrush  - Avoid constipation or straining with bowel movements. This may mean you occasionally need to take over-the-counter stool softeners or laxatives.   - Avoid people who have  colds or the flu, or are not feeling well.  - Wear a mask when visiting crowded places.  - Maintain a well-balanced diet and eat healthy foods  - Speak with your doctor before having any dental work done  - Do only as much activity as you can tolerate    Other instructions:  - Don't use dental floss if your platelet count is below 50,000. Your doctor or nurse should tell you if this is the case.  - Use any mouthwashes given to you as directed.  - If you can't tolerate regular brushing, use an oral swab (bristle-less) toothbrush, or use salt and baking soda to clean your mouth. Mix 1 teaspoon of salt and 1 teaspoon of baking soda into an 8-ounce glass of warm water. Swish and spit.  - Watch your mouth and tongue for white patches. This is a sign of fungal infection, a common side effect of chemotherapy. Be sure to tell your doctor about these patches. Medication/mouthwashes can be prescribed to help you fight the fungal infection.      COVID-19 is a new challenge, but [facility] is dedicated to providing you and your loved ones with the best possible cancer care and support in the safest way possible during this time. We made two videos about the ways we are working to keep you safe, such as offering the option to visit your care team over the phone or through a video, as well as support services offered for our patients and their caregivers. If you have any questions about your cancer care, please call your care team.     Video #1: Keeping [facility] Cancer Care patients safe during the COVID-19 crisis  http://go.eabjmlille.com     Video #2: Support for cancer patients and their caregivers during the COVID-19 pandemic  http://go.SecureGap.uy     Video #2: Support for cancer patients and their caregivers during the COVID-19 pandemic  http://go.SecureGap.uy            Follow Up instructions and Outpatient Referrals     Call MD for:  difficulty breathing, headache or visual disturbances      Call MD for:  persistent nausea or vomiting      Call MD for:  severe uncontrolled pain      Call MD for:  temperature >38.5 Celsius      Discharge instructions          Appointments which have been scheduled for you      Mar 02, 2022  9:15 AM  (Arrive by 9:00 AM)  RETURN GENERAL with Raynelle Chary, FNP  [facility] UROLOGY SERVICE [place] [facility] REGION) [address]  [facility] 1 through 4  [place] [address]  [phone]        Mar 03, 2022  8:00 AM  (Arrive by 7:30 AM)  NURSE LAB DRAW with ADULT ONC LAB  [facility] ADULT ONCOLOGY LAB DRAW [place] [facility] REGION) [address]  [place] [address]  [phone]        Mar 03, 2022  9:00 AM  (Arrive by 8:30 AM)  RETURN ACTIVE [place] with Lenon Ahmadi, [place]  [facility] HEMATOLOGY ONCOLOGY 2ND FLR CANCER HOSP [facility] REGION) [address]  [place] [address]  [phone]        Mar 03, 2022  9:30 AM  (Arrive by 9:00 AM)  RETURN PALLIATIVE CARE with Everlena Cooper,  MD  [place] HEMATOLOGY ONCOLOGY 2ND FLR CANCER HOSP [facility] REGION) [address]  [place] [address]  [phone]        Mar 03, 2022 10:20 AM  (Arrive by 9:50 AM)  NEW NUTRITION with Howie Ill, RD/LDN  [facility] NUTRITION SERVICES CANCER HOSP [place] [facility] REGION) [address]  [place] [address]  [phone]        Apr 06, 2022  1:00 PM  (Arrive by 12:45 PM)  RETURN  PULMONARY with Gaston Islam, DO  [facility] PULMONARY SPECIALTY CL [place] [facility] [facility] REGION) [address]  [facility] 1 through 4  [place] [address]  [phone]             ______________________________________________________________________  Discharge Day Services:  BP 128/79  - Pulse 87  - Temp 36.7 ??C (98 ??F) (Oral)  - Resp 20  - Ht 188 cm (6' 2.02)  - Wt (!) 136.1 kg (300 lb)  - SpO2 94%  - BMI 38.50 kg/m??   Pt seen on the day of discharge and determined appropriate for discharge.    General: No acute distress  HEENT: MMM. Sclera anicteric  Heart: S1 and S2 present.   Lungs: Mild wheezing appreciated diffusely. Patient noted pain upon his own palpation of R lower rib but denied testing of palpation.   Abdomen: No tenderness to palpation.   Extremities: No edema noted.     Condition at Discharge: good    Length of Discharge: I spent greater than 30 mins in the discharge of this patient.

## 2022-02-10 LAB — HEXAG PHOSPHOLIPID APTT
HPN DIFFERENCE: 3.1 s (ref 0.0–7.9)
WITH PHOSPHOLIPID: 41.4 s

## 2022-02-10 LAB — LUPUS INHIBITOR PANEL
DILUTE RUSSELL VIPER VENOM TIME: 39.1 s (ref 0.0–46.4)
PTT LUPUS ANTICOAGULANT: 45 s — ABNORMAL HIGH (ref 0.0–42.8)

## 2022-02-10 LAB — BETA-2 GLYCOPROTEIN ANTIBODIES
BETA-2 GLYCOPROTEIN 1 IGG ANTIBODY: 1 (ref <20.0)
BETA-2 GLYCOPROTEIN 1 IGM ANTIBODY: <1 (ref <20.0)

## 2022-02-10 LAB — CARDIOLIPIN ANTIBODY, IGM/IGG
ANTICARDIOLIPIN IGG ANTIBODY: <1 [GPL'U] (ref 0.0–23.0)
ANTICARDIOLIPIN IGM ANTIBODY: 6 [MPL'U] (ref 0.0–11.0)

## 2022-02-11 NOTE — Unmapped (Signed)
Resource RN called to discuss symptoms reported on weekly survey and Proofreader. [name] reported that AD was not discussed when he was inpatient, and he would still like to discuss still. [name] said he is going to try to stop in the Patient and [facility] after his appointments on 11/30, depending on how he feels, to fill out AD and discuss his questions. [name] was interested in me e-mailing the AD packet to him to start. [name] has my contact information if he has any questions he wants addressed before 11/30. HCDM documented in chart.    Owens Shark, BSN, RN, [facility]   She/Her/Hers  Oncology Nurse Navigator - [place] Patient [facility]  [facility]  [address], [place], [place] [address]    Center: [phone]  Voicemail: [phone]  [email or web address]

## 2022-02-11 NOTE — Unmapped (Signed)
[facility] Refill Coordination Note    Specialty Medication(s) to be Shipped:   Hematology/Oncology: Iclusig    Other medication(s) to be shipped: No additional medications requested for fill at this time     Rae Pivirotto, DOB: [birth year]  Phone: [phone] (home)       All above HIPAA information was verified with patient.     Was a Nurse, learning disability used for this call? No    Completed refill call assessment today to schedule patient's medication shipment from the [facility] [phone]).  All relevant notes have been reviewed.     Specialty medication(s) and dose(s) confirmed: Regimen is correct and unchanged.   Changes to medications: Martavion reports starting the following medications: HCTZ  Changes to insurance: No  New side effects reported not previously addressed with a pharmacist or physician: None reported  Questions for the pharmacist: No    Confirmed patient received a Conservation officer, historic buildings and a Surveyor, mining with first shipment. The patient will receive a drug information handout for each medication shipped and additional FDA Medication Guides as required.       DISEASE/MEDICATION-SPECIFIC INFORMATION        N/A    SPECIALTY MEDICATION ADHERENCE     Medication Adherence    Patient reported X missed doses in the last month: 2  Specialty Medication: Iclusig 30mg   Patient is on additional specialty medications: No  Informant: patient                       Were doses missed due to medication being on hold? No    Iclusig 30 mg: 14 days of medicine on hand       REFERRAL TO PHARMACIST     Referral to the pharmacist: Not needed      [facility]     Shipping address confirmed in Epic.     Delivery Scheduled: Yes, Expected medication delivery date: 02/18/22.     Medication will be delivered via UPS to the prescription address in Epic [place].    [name]   [facility] Specialty Technician

## 2022-02-12 LAB — LUPUS INHIBITOR PANEL
DILUTE RUSSELL VIPER VENOM TIME: 39.1 s (ref 0.0–46.4)
PTT LUPUS ANTICOAGULANT: 45 s — ABNORMAL HIGH (ref 0.0–42.8)

## 2022-02-15 ENCOUNTER — Telehealth: Admit: 2022-02-15 | Discharge: 2022-02-16 | Payer: MEDICAID

## 2022-02-15 DIAGNOSIS — F331 Major depressive disorder, recurrent, moderate: Principal | ICD-10-CM

## 2022-02-15 DIAGNOSIS — F419 Anxiety disorder, unspecified: Principal | ICD-10-CM

## 2022-02-15 DIAGNOSIS — F4310 Post-traumatic stress disorder, unspecified: Principal | ICD-10-CM

## 2022-02-15 NOTE — Unmapped (Signed)
[facility]  Psychiatry--Comprehensive Cancer Support Program   Established Patient E&M Service - Outpatient       Assessment:    Adam Keith presents for follow-up evaluation. ***    Identifying Information:  Adam Keith is a 41 y.o. male with a history of ***    Risk Assessment:  {PSY risk assessment 3:70986}   While future psychiatric events cannot be accurately predicted, the patient does not currently require acute inpatient psychiatric care and does not currently meet [facility] involuntary commitment criteria.      Plan:    Problem 1: ***  Status of problem: {Status of problem:68359}  Interventions: ***    Problem 2: ***  Status of problem: {Status of problem:68359}  Interventions: ***    Problem 3: ***  Status of problem:  {Status of problem:68359}  Interventions: ***      Psychotherapy provided:  {psychotherapy add-on documentation:53071}    Patient has been given this writer's contact information as well as the [facility] Psychiatry urgent line number. The patient has been instructed to call 911 for emergencies.      Subjective:    Chief complaint:  {Chief Complaint:53034}    Interval History: ***  Angry, aggravated, elevated anxiety  Depressed at times usually when I'm thinking about not being here  Appealing for VA benefits  Do I fight the blood clots or do I fight the cancer?  Returned to [employer] ok. Overwhelmed with all of the stressors  No choice about work. Credit cards all maxed out and all of savings is now gone  Felt like pushed off onto PCP  Considering switch to [facility]  Recently lost a friend who had a long cancer battle of ovarian and eventually died of leukemia.      Prior Psychiatric Medication Trials:    Psychiatric Review of Systems  Depressive Symptoms: {Symptoms; depression:1002}  Manic Symptoms: {jlm mania symptoms:57189}  Psychosis Symptoms: {jlm psychosis symptoms:57190}  Anxiety Symptoms: {AGH Anxiety Symptoms:35548}   Obsession/Compulsions:  Panic Symptoms: {AGH Panic Symptoms:35842}  Trauma Symptoms:   Sleep disturbance: {Insomnia [phone]}   Appetite: {JB3 NPIQ2 Appetite/Eating:35637}  Exercise: {Daily exercise routine:51451}       Objective:      Mental Status Exam:  Appearance:    Appears stated age, Well nourished, and Clean/Neat   Motor:   No abnormal movements   Speech/Language:    Normal rate, volume, tone, fluency   Mood:    Angry   Affect:   Angry, Anxious, and Cooperative   Thought process and Associations:   Logical, linear, clear, coherent, goal directed   Abnormal/psychotic thought content:     Denies SI, HI, self harm, delusions, obsessions, paranoid ideation, or ideas of reference   Perceptual disturbances:     Denies auditory and visual hallucinations, behavior not concerning for response to internal stimuli     Other:   Insight and judgment intact     I personally spent 48 minutes face-to-face and non-face-to-face in the care of this patient, which includes all pre, intra, and post visit time on the date of service.    Visit was completed by video (or phone) and the appropriate disclaimer has been included below.    The patient reports they are physically located in [place] and is currently: at home. I conducted a audio/video visit. I spent  59m 09s on the video call with the patient. I spent an additional 20 minutes on pre- and post-visit activities on the date of  service .       Sheran Lawless, PMHNP  02/15/2022 Exam:  Appearance:    Appears stated age, Well nourished, and Clean/Neat   Motor:   No abnormal movements   Speech/Language:    Normal rate, volume, tone, fluency   Mood:    Angry   Affect:   Angry, Anxious, and Cooperative   Thought process and Associations:   Logical, linear, clear, coherent, goal directed   Abnormal/psychotic thought content:     Denies SI, HI, self harm, delusions, obsessions, paranoid ideation, or ideas of reference   Perceptual disturbances:     Denies auditory and visual hallucinations, behavior not concerning for response to internal stimuli     Other:   Insight and judgment intact     I personally spent 48 minutes face-to-face and non-face-to-face in the care of this patient, which includes all pre, intra, and post visit time on the date of service.    Visit was completed by video (or phone) and the appropriate disclaimer has been included below.    The patient reports they are physically located in [place] and is currently: at home. I conducted a audio/video visit. I spent  65m 09s on the video call with the patient. I spent an additional 20 minutes on pre- and post-visit activities on the date of service .       Sheran Lawless, PMHNP  02/15/2022

## 2022-02-15 NOTE — Unmapped (Signed)
Comprehensive Cancer Support Program (CCSP) - Psychiatry Outpatient Clinic   After Visit Summary    It was a pleasure to see you today in the [facility] Comprehensive Cancer Support Program (CCSP). The CCSP is a multidisciplinary program dedicated to helping patients, caregivers, and families with cancer treatment, recovery and survivorship.      To schedule, cancel, or change your appointment:  Please call the [facility] schedulers at [phone], Monday through Friday 8AM - 5PM.  Someone will return your call within 24 hours.      If you have a question about your medicines or you need to contact your provider:  First, try sending a My Chart message to Chante Mayson, PMHNP. If you are unable to do so, please call the CCSP program coordinator, Devon Suitt, at [phone].     For after hours urgent issues, you may call [phone] or call the "I need to talk" line at 1-800-273-TALK (8255) anytime 24/7.    CCSP Patient and Family Resource Center: [phone].    CCSP Website:  [email or web address]    For prescription refills, please allow at least 24 hours (during business hours, M-F) for providers to call in refills to your pharmacy. We are generally unable to accommodate same-day requests for refills.     If you are taking any controlled substances (such as anxiety or sleep medications), you must use them as the directions say to use them. We generally do not provide early refills over the phone without clear reason, and it would be inappropriate to obtain the medications from other doctors. We routinely use the [place] controlled substance database to monitor prescription drug use.

## 2022-02-17 MED FILL — ICLUSIG 30 MG TABLET: ORAL | 30 days supply | Qty: 30 | Fill #2

## 2022-02-18 NOTE — Unmapped (Signed)
Participant: Adam Keith  Coach: Hansel Starling    Summary Statement:    This is a 41 year old with a history of acute leukemia, currently being treated with panatinib. The participant reports that since the last call, there has been a hospital stay due to RSV infection. The participant reports that since the last call, there were not discussions with the clinical team about treatment planning or about the overall disease course. He is anticipating his visit with his clinical team on the 30th to discuss next steps for his treatment. He has not been meeting his goals since the last call.      Coaching Call Review 02/16/22  Adam Keith is recovering after his RSV infection and subsequent hospitalization, as such our call was very short today. He continues to struggle with shortness of breath and is uncertain if that is related to his RSV infection.  He intends to return to work tomorrow.  During today???s call he was in workshop.     Weekly Report for week starting 02/15/2022  Please click below to view a more detailed report on our [employer] website:  weekly report 02/13/2022 * Please hold the shift key when clicking the hyperlink to access your patient???s report      New SMART Goals:     Movement:   Domain discussed: yes  No goals    Mood:   Domain discussed:  yes   No goals      Meaning:   Domain discussed: no      Coaching Call Detailed Notes 02/16/22       Symptoms:   Adam Keith continues to have pain and shortness of breath. Resting is helpful in the management of these symptoms.  During our call, he had not yet taken his survey and he declined taking it together.      Movement:   Movement was discussed this call.   In the last week Adam Keith completed 0 bouts of intentional exercise. Adam Keith challenged his sedentary time by: working in his workshop     Adam Keith reports the following changes in their physical function: Adam Keith has continued frustration around not being as functional and strong as he once was.     Adam Keith attempted some strength training last week and found that just getting to the floor caused him to be short of breath.     Mood:   Mood was discussed this call.    In the last week, Adam Keith reports new stressors related to his care planning.  He is concerned about ???fighting the cancer or fighting blood clots??? and is unsure about next steps.  He has an appointment in two weeks with his care team and he is hopeful that some decisions will be made about how to better manage his symptoms and keep his cancer under control.        Meaning:   Meaning and meaning-making were not discussed this call.     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       Next call: November 21 10:30 am

## 2022-02-19 DIAGNOSIS — I82432 Acute embolism and thrombosis of left popliteal vein: Principal | ICD-10-CM

## 2022-02-19 MED ORDER — NORTRIPTYLINE 50 MG CAPSULE
ORAL_CAPSULE | 11 refills | 0 days
Start: 2022-02-19 — End: ?

## 2022-02-19 MED ORDER — ELIQUIS DVT-PE TREATMENT 30-DAY STARTER 5 MG (74 TABLETS) IN DOSE PACK
ORAL_TABLET | 1 refills | 0 days
Start: 2022-02-19 — End: ?

## 2022-02-21 MED ORDER — APIXABAN 5 MG TABLET
ORAL_TABLET | Freq: Two times a day (BID) | ORAL | 6 refills | 30 days | Status: CP
Start: 2022-02-21 — End: ?

## 2022-02-21 MED ORDER — NORTRIPTYLINE 50 MG CAPSULE
ORAL_CAPSULE | 11 refills | 0 days | Status: CP
Start: 2022-02-21 — End: ?

## 2022-02-21 MED ORDER — ELIQUIS DVT-PE TREATMENT 30-DAY STARTER 5 MG (74 TABLETS) IN DOSE PACK
ORAL_TABLET | 1 refills | 0 days
Start: 2022-02-21 — End: ?

## 2022-02-21 NOTE — Unmapped (Signed)
Pt is requesting refill    Most recent clinic visit: 12/28/2021  Next clinic visit:  03/03/2022

## 2022-02-21 NOTE — Unmapped (Signed)
Duplicate. I refilled the other order instead.

## 2022-02-22 ENCOUNTER — Emergency Department
Admit: 2022-02-22 | Discharge: 2022-02-23 | Disposition: A | Discharge status: Home / Self Care | Payer: MEDICAID | Attending: Emergency Medicine

## 2022-02-22 ENCOUNTER — Ambulatory Visit
Admit: 2022-02-22 | Discharge: 2022-02-23 | Disposition: A | Discharge status: Home / Self Care | Payer: MEDICAID | Attending: Emergency Medicine

## 2022-02-22 DIAGNOSIS — R109 Unspecified abdominal pain: Principal | ICD-10-CM

## 2022-02-22 LAB — COMPREHENSIVE METABOLIC PANEL
ALBUMIN: 4.1 g/dL (ref 3.4–5.0)
ALKALINE PHOSPHATASE: 92 U/L (ref 46–116)
ALT (SGPT): 96 U/L — ABNORMAL HIGH (ref 10–49)
ANION GAP: 9 mmol/L (ref 5–14)
AST (SGOT): 45 U/L — ABNORMAL HIGH (ref <=34)
BILIRUBIN TOTAL: 0.4 mg/dL (ref 0.3–1.2)
BLOOD UREA NITROGEN: 19 mg/dL (ref 9–23)
BUN / CREAT RATIO: 10
CALCIUM: 9.1 mg/dL (ref 8.7–10.4)
CHLORIDE: 108 mmol/L — ABNORMAL HIGH (ref 98–107)
CO2: 25 mmol/L (ref 20.0–31.0)
CREATININE: 1.93 mg/dL — ABNORMAL HIGH
EGFR CKD-EPI (2021) MALE: 44 mL/min/{1.73_m2} — ABNORMAL LOW (ref >=60)
GLUCOSE RANDOM: 102 mg/dL (ref 70–179)
POTASSIUM: 4.4 mmol/L (ref 3.4–4.8)
PROTEIN TOTAL: 6.6 g/dL (ref 5.7–8.2)
SODIUM: 142 mmol/L (ref 135–145)

## 2022-02-22 LAB — CBC W/ AUTO DIFF
BASOPHILS ABSOLUTE COUNT: 0.2 10*9/L — ABNORMAL HIGH (ref 0.0–0.1)
BASOPHILS RELATIVE PERCENT: 1.5 %
EOSINOPHILS ABSOLUTE COUNT: 0.3 10*9/L (ref 0.0–0.5)
EOSINOPHILS RELATIVE PERCENT: 2.8 %
HEMATOCRIT: 45 % (ref 39.0–48.0)
HEMOGLOBIN: 15.2 g/dL (ref 12.9–16.5)
LYMPHOCYTES ABSOLUTE COUNT: 2.6 10*9/L (ref 1.1–3.6)
LYMPHOCYTES RELATIVE PERCENT: 22.8 %
MEAN CORPUSCULAR HEMOGLOBIN CONC: 33.8 g/dL (ref 32.0–36.0)
MEAN CORPUSCULAR HEMOGLOBIN: 29.3 pg (ref 25.9–32.4)
MEAN CORPUSCULAR VOLUME: 86.6 fL (ref 77.6–95.7)
MEAN PLATELET VOLUME: 8.5 fL (ref 6.8–10.7)
MONOCYTES ABSOLUTE COUNT: 0.8 10*9/L (ref 0.3–0.8)
MONOCYTES RELATIVE PERCENT: 7.3 %
NEUTROPHILS ABSOLUTE COUNT: 7.5 10*9/L (ref 1.8–7.8)
NEUTROPHILS RELATIVE PERCENT: 65.6 %
PLATELET COUNT: 204 10*9/L (ref 150–450)
RED BLOOD CELL COUNT: 5.19 10*12/L (ref 4.26–5.60)
RED CELL DISTRIBUTION WIDTH: 15.7 % — ABNORMAL HIGH (ref 12.2–15.2)
WBC ADJUSTED: 11.5 10*9/L — ABNORMAL HIGH (ref 3.6–11.2)

## 2022-02-22 LAB — URINALYSIS WITH MICROSCOPY WITH CULTURE REFLEX
BACTERIA: NONE SEEN /HPF
BILIRUBIN UA: NEGATIVE
BLOOD UA: NEGATIVE
GLUCOSE UA: NEGATIVE
KETONES UA: NEGATIVE
LEUKOCYTE ESTERASE UA: NEGATIVE
NITRITE UA: NEGATIVE
PH UA: 5.5 (ref 5.0–9.0)
PROTEIN UA: 30 — AB
RBC UA: <1 /HPF (ref <=3)
SPECIFIC GRAVITY UA: 1.024 (ref 1.003–1.030)
SQUAMOUS EPITHELIAL: <1 /HPF (ref 0–5)
UROBILINOGEN UA: 2 — AB
WBC UA: 1 /HPF (ref <=2)

## 2022-02-22 LAB — PROTIME-INR
INR: 0.94
PROTIME: 10.5 s (ref 9.9–12.6)

## 2022-02-22 LAB — B-TYPE NATRIURETIC PEPTIDE: B-TYPE NATRIURETIC PEPTIDE: 7 pg/mL (ref <=100)

## 2022-02-22 LAB — MAGNESIUM: MAGNESIUM: 2 mg/dL (ref 1.6–2.6)

## 2022-02-22 LAB — HIGH SENSITIVITY TROPONIN I - SINGLE: HIGH SENSITIVITY TROPONIN I: 5 ng/L (ref <=53)

## 2022-02-22 MED ADMIN — morphine 4 mg/mL injection 4 mg: 4 mg | INTRAVENOUS | @ 23:00:00 | Stop: 2022-02-22

## 2022-02-22 NOTE — Unmapped (Signed)
Patient reported worsening SOB, kidney pain, difficulty urinating, and worsening leg swelling to [employer] coach on weekly call this morning. Health coach encouraged Adam Keith to contact clinical team and made resource RN aware of symptoms.     10:59 am- Resource RN called patient to discuss symptoms. Based on details below, advised that he should report to the ED. Adam Keith said, he will think about it and will go if it gets worse. I told him that I will call him back this afternoon for an update, but I advise him to go to the ED now.   Stated that his leg swelling is so bad he thought he would have to cut his boots off after work. Continues to take prescribed diuretic and has made it clear that he does not want to change his diuretic.   Last week he was SOB with exertion, which continues this week. Reports that he has moments where he gets a cold sensation, feels like his heart stops and then sinks into his belly, and is followed by SOB that will subside without intervention. He said this comes and goes but typically happens several times a day. Symptom does not align with feelings of anxiety and happens when he is at rest. [name] no longer has a cough and has stopped smoking.  Adam Keith reports difficulty urinating and feeling like he isn't able to empty his bladder completely. Reports that he does not have fever, chills, or blood with urination. Stated that testicular pain and swelling has resolved.   Reports 8-9 kidney stones in the past and that this pain feels different. He said that it feels like someone has punched him in both his kidneys. Pain is in back and does not feel like it is radiating from elsewhere. Has a history of lumbar pain, but said this feels different from his typical lumbar pain. Does take prescribed oxycodone at night when he is not working and plans to talk to Dr. Genice Rouge about a pain medication regimen that works better for his work schedule. Adam Keith said that he tried some heat to help with pain and it helped slightly, but nothing else has helped with the kidney pain.    2:18pm- Resource RN called patient to check-in.   Patient stated, I am afraid to go to the ED because I feel like it is something really bad. We discussed his holiday plans and desire to spend this Thanksgiving out of the hospital. Provided active listening and emotional support.   Encouraged patient to report to the ED. Adam Keith said he plans to report to the ED and if nothing has been done by 8:00am, he will leave.    2:57pm- [name] called health coach and asked me to call him back. Resource RN called him back.   Adam Keith said he does not have a ride to the ED currently. He said that he messaged his care team while he is waiting for his ride to see if anything can be done at home prior to going to the ED. Does still plan to present to the ED when his ride arrives.   Albin Felling my direct callback number.     Owens Shark, BSN, RN, [facility]   She/Her/Hers  Oncology Nurse Navigator - [place] Patient [facility]  [facility]  [address] [place], [place] [address]    Center: [phone]  Voicemail: [phone]  [email or web address]

## 2022-02-23 MED ADMIN — HYDROmorphone (PF) (DILAUDID) injection 1 mg: 1 mg | INTRAVENOUS | @ 01:00:00 | Stop: 2022-02-22

## 2022-02-23 NOTE — Unmapped (Signed)
Pt presents with lower back pain c/w kidney issues per pt. Pt reports a h/o cancer and is immunocompromised.

## 2022-02-23 NOTE — Unmapped (Signed)
Feels like he got punched in the kidneys. Hx leukemia. Denies urinary issues. States feels more swollen.

## 2022-02-23 NOTE — Unmapped (Signed)
[facility]  Emergency Department Provider Note     ED Clinical Impression     Final diagnoses:   Bilateral flank pain (Primary)      Impression, Medical Decision Making, ED Course     Impression: 41 y.o. 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 who presents with 3 days of worsening bilateral flank pain, urinary urgency, as well as intermittent palpitations and mild shortness of breath as described below.     Vital signs stable.  Patient is normotensive and is not tachycardic.  Afebrile.  On exam, heart sounds regular.  Breath sounds clear.  No wheezing or respiratory distress.  Bilateral CVA tenderness. Abdomen soft, nontender. Symmetric bilateral lower extremity pitting edema.  No overlying erythema or tenderness.    Differential diagnosis includes ACS, arrhythmia, pneumonia, pulmonary edema, viral illness, fluid overload, UTI, pyelonephritis, nephrolithiasis, renal infarction, versus other etiology.  Lower suspicion for acute intra-abdominal pathology such as appendicitis or diverticulitis given benign abdominal exam.  Considered PE however patient has been compliant with Eliquis and with normal vital signs, thus less likely.  Do not suspect dissection given lack of tearing or ripping chest pain, no radiation into the back, symmetric radial pulses bilaterally.  No wheezing, normal work of breathing, and normal O2 saturations, less likely to be COPD exacerbation.  No midline spinal tenderness or red flag signs or symptoms to suggest acute spinal pathology such as epidural abscess, epidural hematoma, or cord compression.  Plan for basic labs, BNP, troponin, coags, UA, EKG, chest x-ray, and CT abdomen/pelvis.  Will give morphine for pain.     Diagnostic workup as below.     Orders Placed This Encounter   Procedures    XR Chest Portable    CT abdomen pelvis without contrast    CBC w/ Differential    Comprehensive metabolic panel    hsTroponin I (single, no delta)    B-type Natriuretic Peptide    Urinalysis with Microscopy with Culture Reflex    Magnesium Level    PT-INR    ECG 12 Lead       ED Course as of 02/22/22 2245   Tue Feb 22, 2022   1803 EKG shows normal sinus rhythm with a rate of 89 bpm, normal intervals, no ST elevation or depression, overall unremarkable.   1828 CBC with mild leukocytosis to 11.5, otherwise unremarkable.  No anemia.   1845 CMP shows mild elevations in AST and ALT, creatinine elevated to 1.93 which is patient's baseline.    1845 hsTroponin I: 5   1920 BNP: 7   1943 XR Chest Portable  Per my independent interpretation, shows airspace opacity in the right lung which appears improved from prior chest x-ray on 02/06/2022.    IMPRESSION:  Nonspecific airspace opacity lateral right lung which could represent infection in the correct clinical setting.   2058 UA negative for infection.   2224 CT abdomen pelvis without contrast  IMPRESSION:  1. Hepatic steatosis.  2. Borderline splenomegaly.  3. Mild calcified atherosclerotic disease, greater than expected for age.  4. Additional findings, as described above.   2227 Offered the patient admission however he declines.  States that he would like to go home and try pain management at home and  will return if his symptoms do not improve or worsen.  I believe this is reasonable given his overall reassuring workup.  Plan to discharge the patient at this time.  Strict return precautions were discussed and all questions were answered.  Patient verbalized understanding of this and agrees with this plan.       Independent Interpretation of Studies: I have independently interpreted the following studies:  See MDM and ED course    Discussion of Management With Other Providers or Support Staff: I discussed the management of this patient with the:  N/A    Escalation of Care including OBS/Admission/Transfer was considered:  However patient is appropriate for discharge.  ____________________________________________    The case was discussed with the attending physician, who is in agreement with the above assessment and plan.      History     Chief Complaint  Chief Complaint   Patient presents with    Back Pain       HPI   Adam Keith is a 41 y.o. male with past medical history as below who presents with bilateral flank pain. The patient reports 3 days of worsening bilateral flank pain and urinary urgency. He reports 2 days of worsening leg swelling and diffuse swelling, he is on a diuretic. He reports 2 episodes today of palpitations with shortness of breath that lasts a split second prior to resolving. No syncope. He reports prior issues with his kidneys due to cancer (last chemotherapy last night) and sepsis due to MRSA bacteremia. He reports prior blood clots - one PE and one DVT. He is anticoagulated and has not missed any doses. He denies falls or trauma to his back. He denies fever, chest pain, vomiting, hematochezia, diarrhea, constipation, nasal congestion, or cough.  No urinary retention, saddle anesthesia, or incontinence.  States that his pain feels as though he has been hit in the kidneys.  States that he ideally cannot receive contrast due to his prior kidney injuries.    Outside Historian(s): I have obtained additional history/collateral from N/A.    External Records Reviewed: I have reviewed hem onc mychart messages from today where patient reported bilateral CVA pain and difficulty urinating, he was sent to the ED. 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     Past Medical History:   Diagnosis Date COVID-19 05/31/2020    MRSA bacteremia 01/28/2020    Pneumonia of right lung due to methicillin resistant Staphylococcus aureus (MRSA) (CMS-HCC) 03/09/2021    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    Septic shock due to Staphylococcus aureus (CMS-HCC) 01/28/2020    Tear of medial meniscus of knee 10/25/2016    Testicular pain, left 01/21/2022       Past Surgical History:   Procedure Laterality Date    BONE MARROW BIOPSY & ASPIRATION  01/21/2020         CHG [place], CHEST,REAL TIME  11/20/2020    Procedure: ULTRASOUND, CHEST, REAL TIME WITH IMAGE DOCUMENTATION;  Surgeon: Jerelyn Charles, MD;  Location: [facility] PROCED[facility];  Service: Pulmonary    IR INSERT PORT AGE GREATER THAN 5 YRS  03/10/2020    IR INSERT PORT AGE GREATER THAN 5 YRS 03/10/2020 [name], MD IMG VIR [facility]    PR BRONCHOSCOPY,DIAGNOSTIC W LAVAGE Bilateral 07/20/2020    Procedure: BRONCHOSCOPY, RIGID OR FLEXIBLE, INCLUDE FLUOROSCOPIC GUIDANCE WHEN  PERFORMED; W/BRONCHIAL ALVEOLAR LAVAGE WITH MODERATE SEDATION;  Surgeon: Dellis Filbert, MD;  Location: [facility] PROCED[facility];  Service: Pulmonary       No current facility-administered medications for this encounter.    Current Outpatient Medications:     amLODIPine (NORVASC) 10 MG tablet, Take 1 tablet (10 mg total) by mouth daily., Disp: 30 tablet, Rfl: 0    apixaban (ELIQUIS) 5 mg Tab, Take 1 tablet (5 mg total) by mouth two (2) times a day., Disp: 60 tablet, Rfl: 6    arm brace (WRIST BRACE) Misc, 1 Piece by Miscellaneous route as needed. left ulnar wrist brace, Disp: 1 each, Rfl: 0    carvediloL (COREG) 12.5 MG tablet, Take 1 tablet (12.5 mg total) by mouth Two (2) times a day., Disp: 180 tablet, Rfl: 1    docusate sodium (COLACE) 100 MG capsule, Take 1 capsule (100 mg total) by mouth daily., Disp: , Rfl:     doxycycline (VIBRA-TABS) 100 MG tablet, Take 1 tablet (100 mg total) by mouth Two (2) times a day., Disp: 180 tablet, Rfl: 3    gabapentin (NEURONTIN) 300 MG capsule, Take 2 capsules (600mg ) by mouth in the morning and 3 capsules (900mg ) at bedtime. May cause drowsiness., Disp: 150 capsule, Rfl: 5    hydroCHLOROthiazide (HYDRODIURIL) 12.5 MG tablet, Take 1 tablet (12.5 mg total) by mouth daily., Disp: 30 tablet, Rfl: 11    HYDROmorphone (DILAUDID) 2 MG tablet, Take 1 tablet (2 mg total) by mouth two (2) times a day as needed for severe pain for up to 14 days., Disp: 28 tablet, Rfl: 0    mirtazapine (REMERON) 15 MG tablet, TAKE 1 TABLET BY MOUTH EVERY DAY AT NIGHT, Disp: 90 tablet, Rfl: 4    nortriptyline (PAMELOR) 50 MG capsule, TAKE 2 CAPSULES BY MOUTH NIGHTLY., Disp: 60 capsule, Rfl: 11    omeprazole (PRILOSEC) 40 MG capsule, Take 1 capsule (40 mg total) by mouth every morning., Disp: , Rfl:     PONATinib (ICLUSIG) 30 mg tablet, Take 1 tablet (30 mg total) by mouth daily. Swallow tablets whole. Do not crush, break, cut or chew tablets., Disp: 30 tablet, Rfl: 5    spironolactone (ALDACTONE) 25 MG tablet, Take 1 tablet (25 mg total) by mouth daily., Disp: 90 tablet, Rfl: 3    sulfamethoxazole-trimethoprim (BACTRIM DS) 800-160 mg per tablet, TAKE 1 TABLET BY MOUTH 2 TIMES A DAY ON SATURDAY, SUNDAY. FOR PROPHYLAXIS WHILE ON CHEMO., Disp: 48 tablet, Rfl: 3    umeclidinium-vilanteroL (ANORO ELLIPTA) 62.5-25 mcg/actuation inhaler, Inhale 1 puff daily., Disp: 60 each, Rfl: 11    valACYclovir (VALTREX) 500 MG tablet, Take 1 tablet (500 mg total) by mouth daily., Disp: 90 tablet, Rfl: 11    Allergies  Bupropion hcl, Cefepime, Ceftaroline fosamil, Dapsone, Onion, Vancomycin analogues, Bismuth subsalicylate, Privigen [immun glob g(igg)-pro-iga 0-50], Furosemide, and Gammagard    Family History  Family History   Problem Relation Age of Onset    Cancer Mother     No Known Problems Father     No Known Problems Sister     No Known Problems Brother     No Known Problems Maternal Aunt     No Known Problems Maternal Uncle     No Known Problems Paternal Aunt     No Known Problems Paternal Uncle     No Known Problems Maternal Grandmother     Cancer Maternal Grandfather     No Known Problems Paternal Grandmother  No Known Problems Paternal Grandfather     Melanoma Neg Hx     Basal cell carcinoma Neg Hx     Squamous cell carcinoma Neg Hx        Social History  Social History     Tobacco Use    Smoking status: Every Day     Packs/day: 2     Types: Cigarettes     Passive exposure: Current    Smokeless tobacco: Former     Types: Catering manager Use: Never used   Substance Use Topics    Alcohol use: Yes     Comment: Rarely    Drug use: Never        Physical Exam     VITAL SIGNS:      Vitals:    02/22/22 1643 02/22/22 1646 02/22/22 1959   BP:  172/95 152/106   Pulse: 89 95 87   Resp:  18 18   Temp:  36.4 ??C (97.6 ??F) 36.4 ??C (97.5 ??F)   TempSrc:  Oral Oral   SpO2: 98% 98% 95%   Weight:  (!) 136.1 kg (300 lb)        Constitutional: Alert and oriented.  Appears mildly uncomfortable but in no acute distress.  Eyes: Conjunctivae are normal.  HEENT: Normocephalic and atraumatic. Conjunctivae clear. No congestion. Moist mucous membranes.   Cardiovascular: Rate as above, regular rhythm. Normal and symmetric distal pulses. Brisk capillary refill.   Respiratory: Normal respiratory effort. Breath sounds are normal. There are no wheezing or crackles heard.  Gastrointestinal: Soft, non-distended, non-tender. No rebound or guarding.  Genitourinary: Deferred.  Musculoskeletal: Non-tender with normal range of motion in all extremities. Bilateral CVA tenderness. Bilateral lower extremity pitting edema.  No midline spinal tenderness.  Neurologic: Normal speech and language. No gross focal neurologic deficits are appreciated. Patient is moving all extremities equally, face is symmetric at rest and with speech.  5 out of 5 strength in upper and lower extremities.  Sensation intact throughout.  Skin: Skin is warm, dry and intact. No rash noted.  Psychiatric: Mood and affect are normal. Speech and behavior are normal.     Radiology     CT abdomen pelvis without contrast   Final Result      1. Hepatic steatosis.      2. Borderline splenomegaly.      3. Mild calcified atherosclerotic disease, greater than expected for age.      4. Additional findings, as described above.         XR Chest Portable   Final Result   Nonspecific airspace opacity lateral right lung which could represent infection in the correct clinical setting.             Pertinent labs & imaging results that were available during my care of the patient were independently interpreted by me and considered in my medical decision making (see chart for details).    Portions of this record have been created using Scientist, clinical (histocompatibility and immunogenetics). Dictation errors have been sought, but may not have been identified and corrected.    Attestations     Documentation assistance was provided by Mohammed Kindle, Scribe on February 22, 2022 at 5:12 PM for Suzzanne Cloud, MD.     Documentation assistance was provided by the scribe in my presence.  The documentation recorded by the scribe has been reviewed by me and accurately reflects the services I personally performed.  Suzzanne Cloud, MD  Resident  02/22/22 [phone]

## 2022-02-28 MED ORDER — OXYCODONE ER 10 MG TABLET,CRUSH RESISTANT,EXTENDED RELEASE 12 HR
ORAL_TABLET | Freq: Every evening | ORAL | 0 refills | 28 days | Status: CP
Start: 2022-02-28 — End: 2022-03-28

## 2022-03-02 ENCOUNTER — Ambulatory Visit: Admit: 2022-03-02 | Payer: MEDICAID

## 2022-03-02 NOTE — Unmapped (Unsigned)
[facility]ukemia [facility]    Patient Name: Adam Keith  Patient Age: 41 y.o.  Encounter Date: 03/03/2022    Primary Care Provider:  Dierdre Harness, MD    Referring Physician:  Referring, None Per Patient  [address] [place],  [place] [address]    Reason for visit:  Ph+ B-ALL follow up     Assessment:  A 42 y.o. 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 The course was complicated by septic shock, candida krusei fungemia, MRSA bacteremia with septic emboli c/b acute renal failure and respiratory distress requiring dialysis and intubation.  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  He subsequently had difficulty tolerating single agent dasatinib (as a bridge to planned ponatinib-blinatumomab--had fluid retention and pleural effusion).  He then completed  4 cycles of ponatinib-blinatumomab, initiated for treatment of MRD in the setting of poor tolerance of standard therapy.      Patient is currently on ponatinib 30 mg monotherapy (since 01/2021).  Most recent BCR-ABL is positive from the blood (0.002%).    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   - No plan for further IVIG given several reactions     DRESS: followed by Dr. 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 Now s/p dialysis  - stable Cr appears to have developed CKD due to incompletely healed acute injury.  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 CT chest 02/06/22 with residual subacute thrombus.   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 [name] and Dr. Verlan Friends and Dr. Maple Mirza following patient.    - long term doxycycline with goal of decolonization  **Hx Candidemia during induction - now s/p Cresemab  - followed by Dr. [name]  **Hx Covid-19   - symptoms now resolved    Symptom management: bowel regimen as listed in medication list, analgesics as listed in medication list or symptoms reviewed and due to acceptable control, no changes made  **Peripheral neuropathy, sensory and motor  - Grade 1-2 monitor  **Chronic Back Pain, complicated by hospital stay and procedures  - Followed by Dr. Genice Rouge, Palliative Care  **Depression/anxiety: met with [name], [facility]  - nortriptyline  - PRN clonazepam  - has not been using PRN hydroxyzine    Venous access: I recommend the following change for venous access : PICC line removal  Supportive Care Recommendations:  We recommend based on the patient???s underlying diagnosis and treatment history the following supportive care:      1. Antimicrobial prophylaxis:    - Viral - valacyclovir 500mg  daily  - Bacterial - levofloxacin 500 mg if ANC <0.5  - Fungal - on treatment isavuconazole  - PJP - Bactrim DS BID Sat/Sun  - recommend Annual flu shot - declines thus far  - recommend updated Covid vaccine - declines    2. Blood product support:  I recommend the following intervals for laboratory monitoring to determine transfusion needs and monitor for hematologic effects of therapy and underlying disease: no routine monitoring outside of clinic follow-up    Leukoreduced blood products are required.  Irradiated blood products are preferred, but in case of urgent transfusion needs non-irradiated blood products may be used:     -  RBC transfusion threshold: transfuse 2 units for Hgb < 8 g/dL.  -  Platelet transfusion threshold: transfuse 1 unit of platelets for platelet count < 10, or for bleeding or need for invasive procedure.    3. Hematopoietic growth factor support: none      Care coordination: The next [facility]ukemia [facility] Follow up requested:  - 3 month followup      These services include the following elements of medical decision making:  addressing a hematologic cancer that poses a threat to life and/or bone marrow function  interpretation of diagnostic test reports or ordering of diagnostic tests and independent interpretation of diagnostic tests  High risk of morbidity due to drug therapy requiring monitoring for toxicity or decisions regarding goals and continuation of antineoplastic therapy      Mariel Aloe, MD  Leukemia Program  Division of Hematology  [facility]     Nurse Navigator (non-clinical trial patients): Elicia Lamp, RN        Tel. [phone]       Fax. [phone]  Toll-free appointments: [phone]  Scheduling assistance: [phone]  After hours/weekends: [phone] (ask for adult hematology/oncology on-call)            History of Present Illness:  We had the pleasure of seeing Adam Keith in the Leukemia Clinic at the [facility] on 03/03/2022.  He is a 41 y.o. male with  Ph+ B-ALL .          His oncologic history is as follows:      Oncology History Overview Note   Referring/Local Oncologist: None    Diagnosis:Ph+ ALL    Genetics:    Karyotype/FISH:Abnormal Karyotype: Karyotype: 46,XY,t(9;22)(q34;q11.2)[1]/45,XY,der(7;9)(q10;q10)t(9;22)(q34;q11.2),der(22)t(9;22)[11]/46,sdl,+der(22)t(9;22)[5]/46,XY[3]     Abnormal FISH: A BCR/ABL1 interphase FISH assay shows an abnormal signal pattern in 97% of the 100 cells scored. 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  The findings support a diagnosis of leukemia and have implications for targeted therapy and for monitoring residual disease.      Molecular Genetics:  BCR-ABL1 p210 transcripts were detected at a level of 46.479 IS% ratio in bone marrow.  BCR-ABL1 p190 transcripts were detected at a level of 4 in 100,000 cells in bone marrow.    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)  01/24/2020 - 04/02/2020 Chemotherapy    IP/OP LEUKEMIA GRAAPH-2005 + RITUXIMAB < 60 YO  rituximab hypercvad (odd and even course)     02/24/2020 Remission    CR with PCR MRD+; marrow showing 70% blasts with <1% blasts, negative flow MRD, BCR-ABL p210 PCR 0.197%; CNS negative for blasts     03/09/2020 Progression    CSF - rare blast ID'ed - IT chemo given    03/13/20 - IT chemo, CSF negative     04/16/2020 Biopsy    BM Bx with 40-50% cellularity, <1% blasts, MRD flow cytometry negative, BCR-ABL p210 transcripts 0.036%     05/28/2020 - 05/28/2020 Chemotherapy    OP AML - CNS THERAPY (INTRATHECAL CYTARABINE, INTRATHECAL METHOTREXATE, OR INTRATHECAL TRIPLE)  Select one of the following: cytarabine IT 100 mg with hydrocortisone 50 mg, cytarabine IT 40 mg with methotrexate 15 mg with hydrocortisone 50 mg, OR methotrexate IT 12 mg with hydrocortisone 50 mg     06/19/2020 -  Chemotherapy    IP/OP LEUKEMIA 7-DAY INFUSION (MINIMAL RESIDUAL DISEASE; WT >= 22 KG) (HOME INFUSION)      Cycles 1*-4: Blinatumomab 28 mcg/day Days 1-28 of 6-week cycle.  *Given in the inpatient setting on Days 1-3 on Cycle 1 and Days 1-2 on Cycle 2, while other treatment days are given in the outpatient setting.    Cycle 1 MRD Dosing     07/18/2020 Adverse Reaction    Hospitalization: fevers. Pneumonia     07/23/2020 Biopsy    Diagnosis  Bone marrow, right iliac, aspiration and biopsy  -   Normocellular bone marrow (50%) with trilineage hematopoiesis and 1% blasts by manual aspirate differential  -   Flow cytometry MRD analysis reveals no definitive immunophenotypic evidence of residual B lymphoblastic leukemia   - BCR-ABL p210 0.006%         08/10/2020 -  Chemotherapy    Cycle 2 blinatumomab-ponatinib  Ponatinib 30mg     1 IT per cycle     09/21/2020 Adverse Reaction    Covid-19, symptomatic but not requiring hospitalization.     10/06/2020 -  Chemotherapy    Cycle 3 blinatumomab-ponatinib  Ponatinib 30mg        11/01/2020 Adverse Reaction    Hospitalization: MRSA Pneumonia requiring intubation    Blinatumomab stopped.  Ponatinib held until he stabilized.       12/14/2020 -  Chemotherapy    Cycle 4 blinatumomab 28 mcg/day days 1-28, ponatinib 30 mg per day IT triple therapy day 29     01/13/2021 -  Chemotherapy    Ponatinib 30mg  daily  - due to due low level BCR-ABL     03/06/2021 Adverse Reaction    Hospitalization - ICU on ventilator - influenza and MRSA/klebsiella PNA     06/13/2022 -  Chemotherapy    OP LEUKEMIA VINCRISTINE  vinCRIStine 2 mg IV on day 1     ALL (acute lymphoblastic leukemia) (CMS-HCC)   06/11/2020 -  Chemotherapy    IP/OP LEUKEMIA BLINATUMOMAB 7-DAY INFUSION (MINIMAL RESIDUAL DISEASE; WT >= 22 KG) (HOME INFUSION)  Cycles 1*-4: Blinatumomab 28 mcg/day Days 1-28 of 6-week cycle.  *Given in the inpatient setting on Days 1-3 on Cycle 1 and Days 1-2 on Cycle 2, while other treatment days are given in the outpatient setting.     09/21/2020 Initial Diagnosis    ALL (acute lymphoblastic leukemia) (CMS-HCC)     06/13/2022 -  Chemotherapy    OP LEUKEMIA VINCRISTINE  vinCRIStine 2 mg IV on day 1  Acute lymphoblastic leukemia in remission (CMS-HCC)   06/11/2020 -  Chemotherapy    IP/OP LEUKEMIA BLINATUMOMAB 7-DAY INFUSION (MINIMAL RESIDUAL DISEASE; WT >= 22 KG) (HOME INFUSION)  Cycles 1*-4: Blinatumomab 28 mcg/day Days 1-28 of 6-week cycle.  *Given in the inpatient setting on Days 1-3 on Cycle 1 and Days 1-2 on Cycle 2, while other treatment days are given in the outpatient setting.     11/09/2020 Initial Diagnosis    Acute lymphoblastic leukemia in remission (CMS-HCC)     06/13/2022 -  Chemotherapy    OP LEUKEMIA VINCRISTINE  vinCRIStine 2 mg IV on day 1         Interim History:  Doing okay.  Wants to go back to work.  Energy still low, though improving.  Breathing continues to fluctuate.      Past Medical, Surgical and Family History were reviewed and pertinent updates were made in the Electronic Medical Record      ECOG Performance Status: 1    Medications:      Current Outpatient Medications   Medication Sig Dispense Refill    amLODIPine (NORVASC) 10 MG tablet Take 1 tablet (10 mg total) by mouth daily. 30 tablet 0    apixaban (ELIQUIS) 5 mg Tab Take 1 tablet (5 mg total) by mouth two (2) times a day. 60 tablet 6    arm brace (WRIST BRACE) Misc 1 Piece by Miscellaneous route as needed. left ulnar wrist brace 1 each 0    carvediloL (COREG) 12.5 MG tablet Take 1 tablet (12.5 mg total) by mouth Two (2) times a day. 180 tablet 1    docusate sodium (COLACE) 100 MG capsule Take 1 capsule (100 mg total) by mouth daily.      doxycycline (VIBRA-TABS) 100 MG tablet Take 1 tablet (100 mg total) by mouth Two (2) times a day. 180 tablet 3    gabapentin (NEURONTIN) 300 MG capsule Take 2 capsules (600mg ) by mouth in the morning and 3 capsules (900mg ) at bedtime. May cause drowsiness. 150 capsule 5    hydroCHLOROthiazide (HYDRODIURIL) 12.5 MG tablet Take 1 tablet (12.5 mg total) by mouth daily. 30 tablet 11    mirtazapine (REMERON) 15 MG tablet TAKE 1 TABLET BY MOUTH EVERY DAY AT NIGHT 90 tablet 4    nortriptyline (PAMELOR) 50 MG capsule TAKE 2 CAPSULES BY MOUTH NIGHTLY. 60 capsule 11    omeprazole (PRILOSEC) 40 MG capsule Take 1 capsule (40 mg total) by mouth every morning.      oxyCODONE (OXYCONTIN) 10 mg TR12 12 hr crush resistant ER/CR tablet Take 1 tablet (10 mg total) by mouth nightly for 28 days. 28 tablet 0    PONATinib (ICLUSIG) 30 mg tablet Take 1 tablet (30 mg total) by mouth daily. Swallow tablets whole. Do not crush, break, cut or chew tablets. 30 tablet 5    spironolactone (ALDACTONE) 25 MG tablet Take 1 tablet (25 mg total) by mouth daily. 90 tablet 3    sulfamethoxazole-trimethoprim (BACTRIM DS) 800-160 mg per tablet TAKE 1 TABLET BY MOUTH 2 TIMES A DAY ON SATURDAY, SUNDAY. FOR PROPHYLAXIS WHILE ON CHEMO. 48 tablet 3    umeclidinium-vilanteroL (ANORO ELLIPTA) 62.5-25 mcg/actuation inhaler Inhale 1 puff daily. 60 each 11    valACYclovir (VALTREX) 500 MG tablet Take 1 tablet (500 mg total) by mouth daily. 90 tablet 11     No current facility-administered medications for this visit.       Vital Signs:  There were no vitals filed for this  visit.        Relevant Physical Exam Findings:  General: Resting in no apparent distress, accompanied by spouse  HEENT:  Clear sclera, conjunctiva, mask in place  CARDAC: WWP  RESP: nonlabored  GI: non-distended  NEURO: alert, and oriented x4, steady gait, no focal deficits  PSYCH: appropriate  DERM: no visible rashes, lesions        Relevant Laboratory, radiology and pathology results:  I personally viewed the most recent internal records, laboratory results and hematopathology report  and discussed the available results with the patient and family  A summary of results follows:  No visits with results within 1 Week(s) from this visit.   Latest known visit with results is:   Admission on 02/22/2022, Discharged on 02/22/2022 Component Date Value Ref Range Status    Sodium 02/22/2022 142  135 - 145 mmol/L Final    Potassium 02/22/2022 4.4  3.4 - 4.8 mmol/L Final    Chloride 02/22/2022 108 (H)  98 - 107 mmol/L Final    CO2 02/22/2022 25.0  20.0 - 31.0 mmol/L Final    Anion Gap 02/22/2022 9  5 - 14 mmol/L Final    BUN 02/22/2022 19  9 - 23 mg/dL Final    Creatinine 16/01/9603 1.93 (H)  0.73 - 1.18 mg/dL Final    BUN/Creatinine Ratio 02/22/2022 10   Final    eGFR CKD-EPI (2021) Male 02/22/2022 44 (L)  >=60 mL/min/1.97m2 Final    eGFR calculated with CKD-EPI 2021 equation in accordance with [employer] and [employer] of Nephrology Task Force recommendations.    Glucose 02/22/2022 102  70 - 179 mg/dL Final    Calcium 54/12/8117 9.1  8.7 - 10.4 mg/dL Final    Albumin 14/78/2956 4.1  3.4 - 5.0 g/dL Final    Total Protein 02/22/2022 6.6  5.7 - 8.2 g/dL Final    Total Bilirubin 02/22/2022 0.4  0.3 - 1.2 mg/dL Final    AST 21/30/8657 45 (H)  <=34 U/L Final    ALT 02/22/2022 96 (H)  10 - 49 U/L Final    Alkaline Phosphatase 02/22/2022 92  46 - 116 U/L Final    EKG Ventricular Rate 02/22/2022 89  BPM Final    EKG Atrial Rate 02/22/2022 89  BPM Final    EKG P-R Interval 02/22/2022 148  ms Final    EKG QRS Duration 02/22/2022 96  ms Final    EKG Q-T Interval 02/22/2022 352  ms Final    EKG QTC Calculation 02/22/2022 428  ms Final    EKG Calculated P Axis 02/22/2022 60  degrees Final    EKG Calculated R Axis 02/22/2022 91  degrees Final    EKG Calculated T Axis 02/22/2022 73  degrees Final    QTC Fredericia 02/22/2022 401  ms Final    hsTroponin I 02/22/2022 5  <=53 ng/L Final    BNP 02/22/2022 7  <=100 pg/mL Final    Color, UA 02/22/2022 Yellow   Final    Clarity, UA 02/22/2022 Clear   Final    Specific Gravity, UA 02/22/2022 1.024  1.003 - 1.030 Final    pH, UA 02/22/2022 5.5  5.0 - 9.0 Final    Leukocyte Esterase, UA 02/22/2022 Negative  Negative Final    Nitrite, UA 02/22/2022 Negative  Negative Final    Protein, UA 02/22/2022 30 mg/dL (A)  Negative Final    Glucose, UA 02/22/2022 Negative  Negative Final    Ketones, UA 02/22/2022 Negative  Negative  Final    Urobilinogen, UA 02/22/2022 2.0 mg/dL (A)  <6.2 mg/dL Final    Bilirubin, UA 02/22/2022 Negative  Negative Final    Blood, UA 02/22/2022 Negative  Negative Final    RBC, UA 02/22/2022 <1  <=3 /HPF Final    WBC, UA 02/22/2022 1  <=2 /HPF Final    Squam Epithel, UA 02/22/2022 <1  0 - 5 /HPF Final    Bacteria, UA 02/22/2022 None Seen  None Seen /HPF Final    Mucus, UA 02/22/2022 Rare (A)  None Seen /HPF Final    Magnesium 02/22/2022 2.0  1.6 - 2.6 mg/dL Final    PT 69/48/5462 10.5  9.9 - 12.6 sec Final    INR 02/22/2022 0.94   Final    WBC 02/22/2022 11.5 (H)  3.6 - 11.2 10*9/L Final    RBC 02/22/2022 5.19  4.26 - 5.60 10*12/L Final    HGB 02/22/2022 15.2  12.9 - 16.5 g/dL Final    HCT 70/35/0093 45.0  39.0 - 48.0 % Final    MCV 02/22/2022 86.6  77.6 - 95.7 fL Final    MCH 02/22/2022 29.3  25.9 - 32.4 pg Final    MCHC 02/22/2022 33.8  32.0 - 36.0 g/dL Final    RDW 81/82/9937 15.7 (H)  12.2 - 15.2 % Final    MPV 02/22/2022 8.5  6.8 - 10.7 fL Final    Platelet 02/22/2022 204  150 - 450 10*9/L Final    Neutrophils % 02/22/2022 65.6  % Final    Lymphocytes % 02/22/2022 22.8  % Final    Monocytes % 02/22/2022 7.3  % Final    Eosinophils % 02/22/2022 2.8  % Final    Basophils % 02/22/2022 1.5  % Final    Absolute Neutrophils 02/22/2022 7.5  1.8 - 7.8 10*9/L Final    Absolute Lymphocytes 02/22/2022 2.6  1.1 - 3.6 10*9/L Final    Absolute Monocytes 02/22/2022 0.8  0.3 - 0.8 10*9/L Final    Absolute Eosinophils 02/22/2022 0.3  0.0 - 0.5 10*9/L Final    Absolute Basophils 02/22/2022 0.2 (H)  0.0 - 0.1 10*9/L Final     Chest X-ray PA and lateral  Increased interstitial edema versus atypical infection/inflammation.     Small right pleural effusion.    Diagnosis   Date Value Ref Range Status   03/16/2021   Final    A. Lung, right upper lobe, bronchoalveolar lavage:  - No cytologically malignant cells identified  - Abundant neutrophils present    This electronic signature is attestation that the pathologist personally reviewed the submitted material(s) and the final diagnosis reflects that evaluation.       Diagnosis   Date Value Ref Range Status   03/16/2021   Final    A. Lung, right upper lobe, bronchoalveolar lavage:  - No cytologically malignant cells identified  - Abundant neutrophils present    This electronic signature is attestation that the pathologist personally reviewed the submitted material(s) and the final diagnosis reflects that evaluation.     01/11/2021   Final    A:  Cerebrospinal fluid, cytospin review and flow cytometry   -  No blasts identified by morphology or flow cytometric analysis    This electronic signature is attestation that the pathologist personally reviewed the submitted material(s) and the final diagnosis reflects that evaluation.     01/11/2021   Final    Bone marrow, left iliac, aspiration and biopsy  -  Normocellular bone marrow (30% overall)  with trilineage hematopoiesis and less than 1% blasts by manual aspirate differential  -   Flow cytometry MRD analysis reveals no definitive immunophenotypic evidence of residual B lymphoblastic leukemia (see Comment)     -  See linked reports for associated Ancillary Studies.      This electronic signature is attestation that the pathologist personally reviewed the submitted material(s) and the final diagnosis reflects that evaluation.

## 2022-03-03 ENCOUNTER — Ambulatory Visit
Admit: 2022-03-03 | Discharge: 2022-03-03 | Payer: MEDICAID | Attending: Student in an Organized Health Care Education/Training Program | Primary: Student in an Organized Health Care Education/Training Program

## 2022-03-03 ENCOUNTER — Ambulatory Visit: Admit: 2022-03-03 | Discharge: 2022-03-03 | Payer: MEDICAID | Attending: Hematology | Primary: Hematology

## 2022-03-03 ENCOUNTER — Other Ambulatory Visit: Admit: 2022-03-03 | Discharge: 2022-03-03 | Payer: MEDICAID

## 2022-03-03 ENCOUNTER — Ambulatory Visit: Admit: 2022-03-03 | Discharge: 2022-03-03 | Payer: MEDICAID | Attending: Adult Health | Primary: Adult Health

## 2022-03-03 ENCOUNTER — Ambulatory Visit: Admit: 2022-03-03 | Discharge: 2022-03-03 | Payer: MEDICAID | Attending: Registered" | Primary: Registered"

## 2022-03-03 DIAGNOSIS — C9100 Acute lymphoblastic leukemia not having achieved remission: Principal | ICD-10-CM

## 2022-03-03 LAB — CBC W/ AUTO DIFF
BASOPHILS ABSOLUTE COUNT: 0.1 10*9/L (ref 0.0–0.1)
BASOPHILS RELATIVE PERCENT: 1.3 %
EOSINOPHILS ABSOLUTE COUNT: 0.4 10*9/L (ref 0.0–0.5)
EOSINOPHILS RELATIVE PERCENT: 4.2 %
HEMATOCRIT: 44.7 % (ref 39.0–48.0)
HEMOGLOBIN: 15.4 g/dL (ref 12.9–16.5)
LYMPHOCYTES ABSOLUTE COUNT: 2.1 10*9/L (ref 1.1–3.6)
LYMPHOCYTES RELATIVE PERCENT: 23.1 %
MEAN CORPUSCULAR HEMOGLOBIN CONC: 34.5 g/dL (ref 32.0–36.0)
MEAN CORPUSCULAR HEMOGLOBIN: 30.1 pg (ref 25.9–32.4)
MEAN CORPUSCULAR VOLUME: 87.1 fL (ref 77.6–95.7)
MEAN PLATELET VOLUME: 8.9 fL (ref 6.8–10.7)
MONOCYTES ABSOLUTE COUNT: 0.5 10*9/L (ref 0.3–0.8)
MONOCYTES RELATIVE PERCENT: 5.5 %
NEUTROPHILS ABSOLUTE COUNT: 6 10*9/L (ref 1.8–7.8)
NEUTROPHILS RELATIVE PERCENT: 65.9 %
PLATELET COUNT: 195 10*9/L (ref 150–450)
RED BLOOD CELL COUNT: 5.13 10*12/L (ref 4.26–5.60)
RED CELL DISTRIBUTION WIDTH: 15.7 % — ABNORMAL HIGH (ref 12.2–15.2)
WBC ADJUSTED: 9.1 10*9/L (ref 3.6–11.2)

## 2022-03-03 LAB — COMPREHENSIVE METABOLIC PANEL
ALBUMIN: 4.2 g/dL (ref 3.4–5.0)
ALKALINE PHOSPHATASE: 86 U/L (ref 46–116)
ALT (SGPT): 57 U/L — ABNORMAL HIGH (ref 10–49)
ANION GAP: 12 mmol/L (ref 5–14)
AST (SGOT): 28 U/L (ref <=34)
BILIRUBIN TOTAL: 0.5 mg/dL (ref 0.3–1.2)
BLOOD UREA NITROGEN: 17 mg/dL (ref 9–23)
BUN / CREAT RATIO: 10
CALCIUM: 9 mg/dL (ref 8.7–10.4)
CHLORIDE: 106 mmol/L (ref 98–107)
CO2: 23 mmol/L (ref 20.0–31.0)
CREATININE: 1.76 mg/dL — ABNORMAL HIGH
EGFR CKD-EPI (2021) MALE: 49 mL/min/{1.73_m2} — ABNORMAL LOW (ref >=60)
GLUCOSE RANDOM: 138 mg/dL (ref 70–179)
POTASSIUM: 4 mmol/L (ref 3.4–4.8)
PROTEIN TOTAL: 6.5 g/dL (ref 5.7–8.2)
SODIUM: 141 mmol/L (ref 135–145)

## 2022-03-03 MED ORDER — BUPRENORPHINE 15 MCG/HOUR WEEKLY TRANSDERMAL PATCH
MEDICATED_PATCH | TRANSDERMAL | 0 refills | 7 days | Status: CP
Start: 2022-03-03 — End: 2022-03-10

## 2022-03-03 MED ORDER — BUPRENORPHINE 20 MCG/HOUR WEEKLY TRANSDERMAL PATCH
MEDICATED_PATCH | TRANSDERMAL | 0 refills | 28 days | Status: CP
Start: 2022-03-03 — End: 2022-03-31

## 2022-03-03 MED ORDER — BUPRENORPHINE 10 MCG/HOUR WEEKLY TRANSDERMAL PATCH
MEDICATED_PATCH | TRANSDERMAL | 0 refills | 7 days | Status: CP
Start: 2022-03-03 — End: 2022-03-10

## 2022-03-03 NOTE — Unmapped (Signed)
OUTPATIENT ONCOLOGY PALLIATIVE CARE     Principal Diagnosis: Adam Keith is a 41 y.o. 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 He was recently admitted to [facility] with lowe extremity pain and edema. Has had episodic fluctuations in mood but currently doing fairly well.  Has decided not to pursue bone marrow transplant at this time.     Assessment/Plan:      # Depressed Mood: Mood has been vacillating recently as he continues to struggle with not being able to do things he is used to, like working in his woodshop, without being affected by pain and fatigue. Still intermittent periods of depression, but he is hopeful this will improve with improvement in pain management.His wife is his primary support, and he also follows with psych and a therapist.    -Medications per [name]        #Pain: Still with persistent pain, primarily in back and flank regions, which limits him in his daily activities. He did not tolerate dilaudid well, was previously taking Oxycontin at night.  Buprenorphine patch started at 5 micrograms, he has not noted major improvements yet. He is amenable to uptitrating buprenorphine and coming off of oxycontin currently  -Continue gabapentin to 600mg  qAM and 900mg  qbedtime  -No longer using Dilaudid dilaudid 2mg  PO BID PRN       - PDMP reviewed  - Discontinue oxycontin 15mg  PO QHS       - PDMP reviewed  - Increase buprenorphine patch as followed:   10 micrograms x one week   15 micrograms x one week   20 micrograms x one week  -Encouraged patient to reach out via MyChart with updates in how he is doing in light of these changes     # LUE pain: Improved.   - Pain management as above     #Chronic pain: At baseline, see above  - Pain management as above     #Advance care planning: Continues to prioritize quality of life.  After discussion with infectious disease as well as oncology has made the decision not to pursue another bone marrow transplant at this time. #Controlled substances risk management:  Patient does not have a signed pain medication agreement with our team.  NCCSRS database was reviewed today and it was appropriate.  Urine drug screen was not performed at this visit. Findings: not applicable.  Patient has received information about safe storage and administration of medications.  Patient has not received a prescription for narcan; is not applicable.      F/u: Six weeks     ----------------------------------------  Referring Provider: Dr. Berline Lopes  Oncology Team: Malignant hematology team  PCP: [name], MD     HPI: Adam Keith with a diagnosis of B-ALL, diagnosed in October 2021, now in complete remission. Recently admitted to the [facility] ICU 03/06/21-03/20/21 for AHRF d/t MRSA/klebsiella PNA, requiring intubation. This is following admissions in August, May, April, March, February, and January 2022.     Interval History:   Has unfortunately been feeling worse over the last several weeks. Having worsening pain, fatigue and mood. Feels like he doesn't have the motivation or energy that we wants and is unable to spend as much time in his wood working shop as he would like. Describes it as being primarily in his back, legs and feet. Feels like he has sciatica.      Currently taking Butrans 5 mcg/hr patch as well as Oxycontin 10mg  nightly. Feels  like the Dilaudid makes him extremely groggy so taking very infrequently. Started the [place] patch at the end of November. Was off of the Oxycontin for about a week and felt like his pain was a lot worse.      For his mood, continues to see [name] and also enrolled in the [employer] coaching program. Taking Nortriptyline 100mg  nightly and Mirtazapine 15mg . Feeling extremely frustrated.     He does say he is amenable to switching to buprenorphine patch fully while coming off of oxycontin with hope this will help with his pain.  He also voices some challenges with friends dying, particularly those who have cancer, as this makes him think about his own mortality.                 Palliative Performance Scale: 90% - Ambulation: Full / Normal Activity, some evidence of disease / Self-Care:Full / Intake: Normal / Level of Conscious: Full     Coping/Support Issues: Having a lot of difficulty coping with his current setback. Well supported by his wife. Also has a psychiatrist and therapist.     Goals of Care: Treatment and cancer-directed therapy     Social History:  Name of primary support: Wife Adam Keith, 2 daughters.  Occupation: Was in the [employer]	[place]  Hobbies: Woodworking  Current residence / distance from [facility]: [place]     Has a 17 year old son.      Advance Care Planning: Did not discuss this visit.        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    Onion Anaphylaxis    Vancomycin Analogues Other (See Comments)       Hearing loss with Lasix     Other reaction(s): Other (See Comments)     Hearing loss     lasix     Hearing loss      lasix      Hearing loss with Lasix Other reaction(s): Other (See Comments) Hearing loss lasix    Bismuth Subsalicylate Nausea And Vomiting    Privigen [Immun Glob G(Igg)-Pro-Iga 0-50] Other (See Comments)       03/10/2021 VIG infusion stopped as patient developed rigors, tachycardia and HTN during infusion despite pre-medication; 03/12/21 completed IVIG with premedications and slower rate of infusion    Furosemide Other (See Comments)       With Vancomycin caused hearing loss     Other reaction(s): Other (See Comments)     With Vancomycin caused hearing loss      With Vancomycin caused hearing loss Other reaction(s): Other (See Comments) With Vancomycin caused hearing loss    Gammagard           Family History:  Cancer-related family history includes Cancer in his maternal grandfather and mother. There is no history of Melanoma.  He indicated that the status of his mother is unknown. He indicated that the status of his father is unknown. He indicated that the status of his sister is unknown. He  indicated that the status of his brother is unknown. He indicated that the status of his maternal grandmother is unknown. He indicated that the status of his maternal grandfather is unknown. He indicated that the status of his paternal grandmother is unknown. He indicated that the status of his paternal grandfather is unknown. He indicated that the status of his maternal aunt is unknown. He indicated that the status of his maternal uncle is unknown. He indicated that the status of his paternal aunt is unknown. He indicated that the status of his paternal uncle is unknown. He indicated that the status of his neg hx is unknown.        REVIEW OF SYSTEMS:  A comprehensive review of 10 systems was negative except for pertinent positives noted in HPI.           Lab Results   Component Value Date     CREATININE 1.93 (H) 02/22/2022            Lab Results   Component Value Date     ALKPHOS 92 02/22/2022     BILITOT 0.4 02/22/2022     BILIDIR 0.20 02/07/2022     PROT 6.6 02/22/2022     ALBUMIN 4.1 02/22/2022     ALT 96 (H) 02/22/2022     AST 45 (H) 02/22/2022      Physical exam:  General: Overall well-appearing Keith in no acute distress  Pulmonary: No increased work of breathing  Neurological: no acute deficits  Psychiatric:  affect appropriate and full range      Total time, prior to, during, and after visit: 50 minutes.   {  Nehemiah Settle, MD  HPM Fellow

## 2022-03-03 NOTE — Unmapped (Deleted)
OUTPATIENT ONCOLOGY PALLIATIVE CARE    Principal Diagnosis: Adam Keith is a 41 y.o. 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 He was recently admitted to [facility] with with complicated PNA requiring intubation.  Has had episodic fluctuations in mood but currently doing fairly well.  Has decided not to pursue bone marrow transplant at this time.    Assessment/Plan:     # Depressed Mood: Continues to improve as he has been more active.  Still intermittent periods of depression but feels like he is doing better. His wife is his primary support, and he also follows with psych and a therapist.    -Medications per [name]      #Pain: Worsening back pain as he has returned to work. Had difficulty tolerating wean down to Oxycontin at bedtime only. But feeling better now.   -Continue gabapentin to 600mg  qAM and 900mg  qbedtime  -No longer using Dilaudid dilaudid 2mg  PO BID PRN       - PDMP reviewed  - Continue oxycontin 15mg  PO QHS       - PDMP reviewed,   - Referral to [employer]    # LUE pain: Improved.   - Pain management as above    #Chronic pain: At baseline, see above  - Pain management as above    #Advance care planning: Continues to prioritize quality of life.  After discussion with infectious disease as well as oncology has made the decision not to pursue another bone marrow transplant at this time.    #Controlled substances risk management:  Patient does not have a signed pain medication agreement with our team.  NCCSRS database was reviewed today and it was appropriate.  Urine drug screen was not performed at this visit. Findings: not applicable.  Patient has received information about safe storage and administration of medications.  Patient has not received a prescription for narcan; is not applicable.     F/u: November 30th in person    ----------------------------------------  Referring Provider: Dr. Berline Lopes  Oncology Team: Malignant hematology team  PCP: [name], MD    HPI: Adam Keith with a diagnosis of B-ALL, diagnosed in October 2021, now in complete remission. Recently admitted to the [facility] ICU 03/06/21-03/20/21 for AHRF d/t MRSA/klebsiella PNA, requiring intubation. This is following admissions in August, May, April, March, February, and January 2022.    Interval History:   Has unfortunately been feeling worse over the last several weeks. Having worsening pain, fatigue and mood. Feels like he doesn't have the motivation or energy that we wants and is unable to spend as much time in his wood working shop as he would like. Describes it as being primarily in his back, legs and feet. Feels like he has sciatica.     Currently taking Butrans 5 mcg/hr patch as well as Oxycontin 10mg  nightly. Feels like the Dilaudid makes him extremely groggy so taking very infrequently. Started the [place] patch at the end of November. Was off of the Oxycontin for about a week and felt like his pain was a lot worse.     For his mood, continues to see [name] and also enrolled in the [employer] coaching program. Taking Nortriptyline 100mg  nightly and Mirtazapine 15mg . Feeling extremely frustrated.         ***Doing a telephone visit today. Went back to work last week. Feels like he has noticed a lot of problems since returning. Tried to stop daytime Oxycontin and felt like that  was terrible and had significant withdrawal symptoms. Continues to be worried about having opioids in his system bc of workmans comp not covering him.     Plan is to try and drop some weight to help with his pain. Was on steroids for the past month to treat his hearing loss. Feltlike this helped some but still has some loss. Following up with audiology at the end of the month. Legs have been swollen. Coming back in today to get [place]. Can't take lasix bc of his kidneys. Got chemo results yesterday and this was worrisome to him. Is discussing with oncology about wanting to increase his chemo back. Has noticed how deconditioned he has gotten over the last 2 years. Has decreased his Gabapentin to 300mg  in the am and 600mg  in the evening. Does have some sciatic pain. Not taking Klonopin. Has noticed that he gets extremely fatigued around 1p each day.        Palliative Performance Scale: 90% - Ambulation: Full / Normal Activity, some evidence of disease / Self-Care:Full / Intake: Normal / Level of Conscious: Full    Coping/Support Issues: Having a lot of difficulty coping with his current setback. Well supported by his wife. Also has a psychiatrist and therapist.     Goals of Care: Treatment and cancer-directed therapy     Social History:  Name of primary support: Wife Adam Keith, 2 daughters.  Occupation: Was in the [employer]	[place]  Hobbies: Woodworking  Current residence / distance from [facility]: [place]    Has a 85 year old son.      Advance Care Planning: Did not discuss this visit.    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    Onion Anaphylaxis    Vancomycin Analogues Other (See Comments)     Hearing loss with Lasix    Other reaction(s): Other (See Comments)    Hearing loss    lasix    Hearing loss      lasix      Hearing loss with Lasix Other reaction(s): Other (See Comments) Hearing loss lasix    Bismuth Subsalicylate Nausea And Vomiting    Privigen [Immun Glob G(Igg)-Pro-Iga 0-50] Other (See Comments)     03/10/2021 VIG infusion stopped as patient developed rigors, tachycardia and HTN during infusion despite pre-medication; 03/12/21 completed IVIG with premedications and slower rate of infusion    Furosemide Other (See Comments)     With Vancomycin caused hearing loss    Other reaction(s): Other (See Comments)    With Vancomycin caused hearing loss      With Vancomycin caused hearing loss Other reaction(s): Other (See Comments) With Vancomycin caused hearing loss    Gammagard        Family History:  Cancer-related family history includes Cancer in his maternal grandfather and mother. There is no history of Melanoma.  He indicated that the status of his mother is unknown. He indicated that the status of his father is unknown. He indicated that the status of his sister is unknown. He indicated that the status of his brother  is unknown. He indicated that the status of his maternal grandmother is unknown. He indicated that the status of his maternal grandfather is unknown. He indicated that the status of his paternal grandmother is unknown. He indicated that the status of his paternal grandfather is unknown. He indicated that the status of his maternal aunt is unknown. He indicated that the status of his maternal uncle is unknown. He indicated that the status of his paternal aunt is unknown. He indicated that the status of his paternal uncle is unknown. He indicated that the status of his neg hx is unknown.      REVIEW OF SYSTEMS:  A comprehensive review of 10 systems was negative except for pertinent positives noted in HPI.    Lab Results   Component Value Date    CREATININE 1.93 (H) 02/22/2022     Lab Results   Component Value Date    ALKPHOS 92 02/22/2022    BILITOT 0.4 02/22/2022    BILIDIR 0.20 02/07/2022    PROT 6.6 02/22/2022    ALBUMIN 4.1 02/22/2022    ALT 96 (H) 02/22/2022    AST Adam (H) 02/22/2022     Physical exam:  General: Overall well-appearing Keith in no acute distress  Pulmonary: No increased work of breathing  Neurological: Normal gait, no acute deficits  Psychiatric: Mood pretty good today affect appropriate and full range     {    Coding tips - Do not edit this text, it will delete upon signing of note!    Telephone visits [phone] for Physicians and APPs and [phone] for Non- Physician Clinicians)- Only use minutes on the phone to determine level of service.    Video visits [phone]) - Use either level of medical decision making just as an in-person visit OR time which includes both minutes on video and pre/post minutes to determine the level of service.      :[id number]}    The patient reports they are currently: at home. I spent 28 minutes on the phone with the patient on the date of service. I spent an additional 12 minutes on pre- and post-visit activities on the date of service.     The patient was physically located in [place] or a state in which I am permitted to provide care. The patient and/or parent/guardian understood that s/he may incur co-pays and cost sharing, and agreed to the telemedicine visit. The visit was reasonable and appropriate under the circumstances given the patient's presentation at the time.    The patient and/or parent/guardian has been advised of the potential risks and limitations of this mode of treatment (including, but not limited to, the absence of in-person examination) and has agreed to be treated using telemedicine. The patient's/patient's family's questions regarding telemedicine have been answered.     If the visit was completed in an ambulatory setting, the patient and/or parent/guardian has also been advised to contact their provider???s office for worsening conditions, and seek emergency medical treatment and/or call 911 if the patient deems either necessary.    Kathlynn Grate, MD  [facility] Outpatient Oncology Palliative Care

## 2022-03-04 DIAGNOSIS — G893 Neoplasm related pain (acute) (chronic): Principal | ICD-10-CM

## 2022-03-04 MED ORDER — BUPRENORPHINE 10 MCG/HOUR WEEKLY TRANSDERMAL PATCH
MEDICATED_PATCH | TRANSDERMAL | 0 refills | 14 days | Status: CP
Start: 2022-03-04 — End: 2022-03-18

## 2022-03-04 NOTE — Unmapped (Signed)
Participant: Scharlene Gloss  Coach: Hansel Starling    Summary Statement:     This is a 41 year old with a history of acute leukemia, currently being treated with panatinib. The participant reports that since the last call, there has been an ED visit due to kidney pain. The participant reports that since the last call, there were not discussions with the clinical team about treatment planning or about the overall disease course. He is anticipating his visit with his clinical team on the 30th to discuss next steps for his treatment. He has not been meeting his goals since the last call.      Coaching Call Review 02/28/22  [name] went to the ER last week for kidney pain and left feeling like they didn???t assess what he was most worried about (blood clots).  He continues to feel frustrated about the quality of his care and is eager to talk with his care team later this week about next steps.  He is struggling this week with pain management but was feeling contented and happy after his thanksgiving with his family.    Weekly Report for week starting 03/01/2022  Please click below to view a more detailed report on our [employer] website:  weekly report 02/27/2022 * Please hold the shift key when clicking the hyperlink to access your patient???s report       New SMART Goals:     Movement:              Domain discussed: yes  No goals     Mood:              Domain discussed:  yes              No goals        Meaning:              Domain discussed: Yes  SMART Goal 1: [name] will get his 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  SMART Goal 1: Developed by Teaching laboratory technician Call Detailed Notes 02/16/22      Symptoms:  [name] continues to have ???around the clock??? pain despite taking dilaudid.  He reported being out of his oxy which seems to better manage the pain.     Movement:  Movement was discussed this call.  In the last week [name] completed 0 bouts of intentional exercise. [name] challenged his sedentary time by: working in his workshop.     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  He is nervous about the lifting and exertion required to perform this task, but has no other backup other than his wife. [name] plans to continue to integrate physical activity into his day-to-day life by working in [employer] and completing the gifts he needs to by early December.   [name] has no plans to work this week and is grateful for the time to be able to focus on his woodworking.        Mood:  Mood was discussed this call.     In the last week, [name] reports new stressors related to his family life.  He expressed disappointment that his father did not come to Thanksgiving and frustration that his father no longer welcomes his girls into his house due to their relationship statuses.     [name] also expressed anger and is hopeful that he will  diffuse it before his appointment on Thursday.  He plans to manage his anger and frustration by wearing himself out.  In the past, he has tried to cope through alcohol and being positive.  He did not want to talk about other ways to manage his anger or set goals around it.        Meaning:  Meaning and meaning-making were discussed during this call.  [name] continues to find joy in woodworking.  He enjoys being in his woodshop and making things for the people he cares about.  He is looking forward to completing the gifts for his colleagues and made a goal to complete a part of them this week.  This is a 10/10 on the importance and confidence scales for him.     Meal prep and managing his diet are less important to him now.  He feels like he wants to enjoy the rest of his life and that dieting is not part of that picture.       Next call: December 4, 11:00 am

## 2022-03-06 NOTE — Unmapped (Signed)
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 male seen for medical nutrition therapy for nutrition during cancer treatment. 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 His active problem list, medication list, allergies, family history, social history, notes from last a few encounters, and lab results were reviewed.     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    Septic shock due to Staphylococcus aureus (CMS-HCC) 01/28/2020    Tear of medial meniscus of knee 10/25/2016    Testicular pain, left 01/21/2022     Anthropometrics   Height: 188 cm  Current Weight: 135.3 kg  BMI: 38.3 kg/m2  Ideal Body Weight: 86.4 ([place] Method)  Percent Ideal Body Weight: 157%  Recent wt change: 3.6 kg wt gain in ~ 1 month  % wt change: 2.7% wt gain in ~ 1 month    Weight history:  Wt Readings from Last 6 Encounters:   03/03/22 (!) 135.3 kg (298 lb 4.5 oz)   02/22/22 (!) 136.1 kg (300 lb)   02/06/22 (!) 136.1 kg (300 lb)   01/21/22 (!) 131.7 kg (290 lb 5.5 oz)   12/30/21 (!) 127 kg (280 lb)   12/28/21 (!) 132.5 kg (292 lb 1.8 oz)       Nutrition Risk Screening:   Food Insecurity: No Food Insecurity (03/08/2021)    Hunger Vital Sign     Worried About Running Out of Food in the Last Year: Never true     Ran Out of Food in the Last Year: Never true        Nutrition-Focused Physical Findings   Overall Appearance: Nourished  Skin:  Intact    Physical Activity:  Physical activity level is light with some exercise.     Nutrition Focused Physical Exam:    Nutrition Evaluation  Overall Impressions: Nutrition-Focused Physical Exam not indicated due to lack of malnutrition risk factors. (03/07/22 1827)     Malnutrition Assessment using AND/ASPEN Clinical Characteristics:    Patient does not meet AND/ASPEN criteria for malnutrition at this time (03/07/22 1827)      Biochemical Data, Medical Tests and Procedures:  All pertinent labs and imaging reviewed by Howie Ill, RD/LDN at 9:00 AM 03/03/2022.    Cr 1.76 mg/dL, eGFR 49 ZO/XWR/6.04V4    Medications and Vitamin/Mineral Supplementation:   All nutritionally pertinent medications reviewed on 03/03/2022.   Nutritionally pertinent medications include: Remeron  He is not taking nutrition supplements.     Current Outpatient Medications   Medication Sig Dispense Refill    amLODIPine (NORVASC) 10 MG tablet Take 1 tablet (10 mg total) by mouth daily. 30 tablet 0    apixaban (ELIQUIS) 5 mg Tab Take 1 tablet (5 mg total) by mouth two (2) times a day. 60 tablet 6    arm brace (WRIST BRACE) Misc 1 Piece by Miscellaneous route as needed. left ulnar wrist brace 1 each 0    buprenorphine (BUTRANS) 10 mcg/hour PTWK transdermal patch Place 1 patch on the skin  every seven (7) days for 7 days, THEN 2 patches every seven (7) days for 7 days. 4 patch 0    buprenorphine 15 mcg/hour PTWK transdermal patch Place 1 patch on the skin every seven (7) days for 7 days. 1 patch 0    [START ON 03/17/2022] buprenorphine 20 mcg/hour PTWK transdermal patch Place 1 patch on the skin every seven (7) days for 28 days. 4 patch 0    carvediloL (COREG) 12.5 MG tablet Take 1 tablet (12.5 mg total) by mouth Two (2) times a day. 180 tablet 1    docusate sodium (COLACE) 100 MG capsule Take 1 capsule (100 mg total) by mouth daily.      doxycycline (VIBRA-TABS) 100 MG tablet Take 1 tablet (100 mg total) by mouth Two (2) times a day. 180 tablet 3    gabapentin (NEURONTIN) 300 MG capsule Take 2 capsules (600mg ) by mouth in the morning and 3 capsules (900mg ) at bedtime. May cause drowsiness. 150 capsule 5 hydroCHLOROthiazide (HYDRODIURIL) 12.5 MG tablet Take 1 tablet (12.5 mg total) by mouth daily. 30 tablet 11    mirtazapine (REMERON) 15 MG tablet TAKE 1 TABLET BY MOUTH EVERY DAY AT NIGHT 90 tablet 4    nortriptyline (PAMELOR) 50 MG capsule TAKE 2 CAPSULES BY MOUTH NIGHTLY. 60 capsule 11    omeprazole (PRILOSEC) 40 MG capsule Take 1 capsule (40 mg total) by mouth every morning.      PONATinib (ICLUSIG) 30 mg tablet Take 1 tablet (30 mg total) by mouth daily. Swallow tablets whole. Do not crush, break, cut or chew tablets. 30 tablet 5    spironolactone (ALDACTONE) 25 MG tablet Take 1 tablet (25 mg total) by mouth daily. 90 tablet 3    sulfamethoxazole-trimethoprim (BACTRIM DS) 800-160 mg per tablet TAKE 1 TABLET BY MOUTH 2 TIMES A DAY ON SATURDAY, SUNDAY. FOR PROPHYLAXIS WHILE ON CHEMO. 48 tablet 3    umeclidinium-vilanteroL (ANORO ELLIPTA) 62.5-25 mcg/actuation inhaler Inhale 1 puff daily. 60 each 11    valACYclovir (VALTREX) 500 MG tablet Take 1 tablet (500 mg total) by mouth daily. 90 tablet 11     No current facility-administered medications for this visit.       Nutrition History:     Dietary Restrictions: He reported food allergies to onions.    Gastrointestinal Issues: Denied issues    Oral Issues: taste changes- salt/cardboard taste    Hunger and Satiety: Denied issues.     Food Safety and Access: He did not report issues.     Other: fatigue      Diet Recall:   Breakfast: [place]- sausage and cheese biscuit, gravy biscuit and hash browns X 2  Lunch: Hibachi, sushi, or pizza  Dinner: pork chops/chicken with broccoli, potatoes  Snack(s): cottage cheese with fruit, nachos, small portion of leftovers  Beverages: 2 L Mountain Dew, large glass of whole milk    Alcohol: none reported  Supplements: none reported  Enteral feedings: no feeding tube    Pt reports eating majority of meals outside of home. Often gets snacks from gas station due to convenience. Desires to make healthier food choices.  Nutrition Assessment     Food- and nutrition-related knowledge deficit related to healthy eating habits, management of nutrition-related impact symptoms during treatment as evidenced by pt's questions, high intake of energy dense foods and beverages.      Daily Estimated Nutrient Needs:  Energy: 25-30 kcal/kg using ideal body weight, 86.4 kg (03/07/22 1825)]  Protein: [0.8-1 gm/kg using ideal body  weight, 86.4 kg (03/07/22 1825)]  Carbohydrate: [no restriction]  Fluid: [1 mL/kcal (maintenance)]     Nutrition Goals & Evaluation    - Increase intake of fruit/veggies at snacks.  (New)  - Increase knowledge regarding management of taste changes. (Met)      Nutrition goals reviewed, and relevant barriers identified and addressed: none evident. He is evaluated to have excellent willingness and ability to achieve nutrition goals.     Nutrition Intervention      - Nutrition Education: nutrition during cancer treatment diet/eating style. Education resources provided include: handouts promoting nutrition intervention  and contact information   - Nutrition Counseling: Motivational Interviewing Goal setting Problem solving    Nutrition Plan:   Recommend filling 1/2 plate with variety of fruits and vegetables, 1/4 with whole grain, and 1/4 with lean protein.  Will bring fruit and veggies to work for snacks vs going to gas station. Discussed including protein and fat with fruit/veggies such as peanut butter, nuts/seeds, cottage cheese, low fat cheese.  Discussed strategies to manage taste changes such as including fruit with meals, adding lemon/lime juice, and using fruit/vinegar-based sauces/dressings.      Materials Provided: Taste Changes      Follow up will occur in 1 month, or as needed.       Food/Nutrition-related history, Anthropometric measurements, Biochemical data, medical tests, procedures, Nutrition-focused physical findings, Patient understanding or compliance with intervention and recommendations , and Effectiveness of nutrition interventions will be assessed at time of follow-up.     Recommendations for Clinical Team, Care Team , and Referring Provider :  - Encourage pt to follow nutrition plan as noted above.       Time spent 30 minutes

## 2022-03-07 ENCOUNTER — Telehealth: Admit: 2022-03-07 | Discharge: 2022-03-08 | Payer: MEDICAID

## 2022-03-07 DIAGNOSIS — F330 Major depressive disorder, recurrent, mild: Principal | ICD-10-CM

## 2022-03-07 DIAGNOSIS — F4310 Post-traumatic stress disorder, unspecified: Principal | ICD-10-CM

## 2022-03-07 NOTE — Unmapped (Signed)
[facility]  Psychiatry--Comprehensive Cancer Support Program   Established Patient E&M Service - Outpatient       Assessment:    Smayan Berenson presents for follow-up evaluation. He states that his mood is improving. Ongoing PTSD symptoms, especially irritability. Recently sent in appeal to [facility] for benefits. Feeling better overall. No medication changes made today. Continuing his nortriptyline 100 mg and mirtazapine 15 mg at bedtime. Will reassess on 06/06/22.    Identifying Information:  Michiah Krocker is a 41 y.o. male with a history of PH+ B cell ALL, recurrent major depressive disorder, PTSD, anxiety, and insomnia. He has had a long struggle with intermittent depressive episodes with one suicide attempt at age 14.     Risk Assessment:  An assessment of suicide and violence risk factors was performed as part of this evaluation and is not significantly increased from the last visit.   While future psychiatric events cannot be accurately predicted, the patient does not currently require acute inpatient psychiatric care and does not currently meet [facility] involuntary commitment criteria.      Plan:    Problem 1: Major Depressive Disorder  Status of problem: chronic with mild exacerbation  Interventions:   Continue nortriptyline 100 mg at bedtime.    Continue mirtazapine 15 mg at bedtime   Encouraged psychotherapy with local therapist     Problem 2: Anxiety/PTSD  Status of problem: chronic with mild exacerbation  Interventions:   See MDD above     Problem 3: Insomnia  Status of problem:  chronic and stable  Interventions:   See mirtazapine plan above      Psychotherapy provided:  No billable psychotherapy service provided.    Patient has been given this writer's contact information as well as the [facility] Psychiatry urgent line number. The patient has been instructed to call 911 for emergencies.      Subjective:    Chief complaint:  Follow-up psychiatric evaluation for depression, anxiety, and PTSD    Interval History:   He states that his mood is improving. Mild depressive symptoms. States that he had a good visit with oncology and is happy that he is being referred to hematology for his blood clots. Ongoing PTSD symptom of irritability (I'm always irritable). Does not feel overly stressed. Sleeping well at night.    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 Wishes he could spend more time with just the wood working. 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    Recently learned that a friend of his has been diagnosed with MS and also 2 friends recently died. Grief expressed.    Concern expressed about his weight. We discussed how mirtazapine and steroids can contribute to weight gain. Patient happy with his current medication regimen and is not interested in  any changes today.     Right now I feel pretty good.    Prior Psychiatric Medication Trials: bupropion (could not tolerate), duloxetine, mirtazapine (given during recent hospitalization), lorazepam, clonazepam     Psychiatric Review of Systems  Depressive Symptoms: depressed mood improving, difficulty concentrating, fatigue  Manic Symptoms: N/A  Psychosis Symptoms: N/A  Anxiety Symptoms: difficulty controlling worry at times, easily fatigued, difficulty concentrating  Panic Symptoms: denies  PTSD Symptoms: anger, irritability  Sleep disturbance: denies  Exercise: Working with [employer]      Objective:      Mental Status Exam:  Appearance:    Appears stated age and Well nourished   Motor:   No abnormal movements  Speech/Language:    Normal rate, volume, tone, fluency   Mood:    Good   Affect:   Calm, Cooperative, and Euthymic   Thought process and Associations:   Logical, linear, clear, coherent, goal directed   Abnormal/psychotic thought content:     Denies SI, HI, self harm, delusions, obsessions, paranoid ideation, or ideas of reference   Perceptual disturbances:     Denies auditory and visual hallucinations, behavior not concerning for response to internal stimuli     Other:   Insight and judgment intact     I personally spent 38 minutes face-to-face and non-face-to-face in the care of this patient, which includes all pre, intra, and post visit time on the date of service.    Visit was completed by video (or phone) and the appropriate disclaimer has been included below.    The patient reports they are physically located in [place] and is currently: at home. I conducted a audio/video visit. I spent 56m 23s on the video call with the patient. I spent an additional 15 minutes on pre- and post-visit activities on the date of service .       Sheran Lawless, PMHNP  03/07/2022

## 2022-03-07 NOTE — Unmapped (Signed)
Comprehensive Cancer Support Program (CCSP) - Psychiatry Outpatient Clinic   After Visit Summary    It was a pleasure to see you today in the [employer] [facility] Comprehensive Cancer Support Program (CCSP). The CCSP is a multidisciplinary program dedicated to helping patients, caregivers, and families with cancer treatment, recovery and survivorship.      To schedule, cancel, or change your appointment:  Please call the [facility] schedulers at [phone], Monday through Friday 8AM - 5PM.  Someone will return your call within 24 hours.      If you have a question about your medicines or you need to contact your provider:  First, try sending a My Chart message to Tamel Abel, PMHNP. If you are unable to do so, please call the CCSP program coordinator, Devon Suitt, at [phone].     For after hours urgent issues, you may call [phone] or call the "I need to talk" line at 1-800-273-TALK (8255) anytime 24/7.    CCSP Patient and Family Resource Center: [phone].    CCSP Website:  [email or web address]    For prescription refills, please allow at least 24 hours (during business hours, M-F) for providers to call in refills to your pharmacy. We are generally unable to accommodate same-day requests for refills.     If you are taking any controlled substances (such as anxiety or sleep medications), you must use them as the directions say to use them. We generally do not provide early refills over the phone without clear reason, and it would be inappropriate to obtain the medications from other doctors. We routinely use the [place] controlled substance database to monitor prescription drug use.

## 2022-03-12 NOTE — Unmapped (Signed)
Participant: Adam Keith  Coach: Adam Keith    Summary Statement:  This is a 41 year old with a history of acute leukemia, currently being treated with panatinib. The participant reports that since the last call, there have been no unplanned ED visits. The participant reports that since the last call, there were discussions with the clinical team about treatment planning or about the overall disease course. He is pleased that his treatment isn???t changing and that a referall was placed for a clot specialist.  He has been meeting his goals since the last call.      Coaching Call Review 03/07/22  Adam Keith was much more upbeat on today???s call.  Although brief, he reports feeling better and that he is pleased by his progress in the shop.    Weekly Report for week starting 03/08/2022  Please click below to view a more detailed report on our [employer] website:  weekly report 03/07/2022 * Please hold the shift key when clicking the hyperlink to access your patient???s report    New SMART Goals:    Movement:  Domain discussed: yes  No goals    Mood:  Domain discussed:  no  No goals    Meaning:  Domain discussed: Yes  SMART Goal 1: Adam Keith will complete the remainder of the officer gifts by the end of the week.  SMART Goal 1: Developed by Medical laboratory scientific officer Call Detailed Notes 03/07/22    Symptoms:  Adam Keith continues to have pain, but it is better managed by the new patch that he is using.  He reports no longer taking his night time oxy.  We discussed the importance of taking his symptom survey and he declined taking it together during the call.      Movement:  Movement was discussed this call.  In the last week Adam Keith completed 0 bouts of intentional exercise. Adam Keith challenged his sedentary time by: working in his workshop.    Adam Keith continues to work and has a physically demanding job. His pain is the biggest barrier related to not moving or completing his woodworking goals.  He declined brainstorming about tools to manage his pain and complete his goals.  His approach is to ???gut it out??? and just keep working through the pain.    I provided a reminder to wear his FitBit.  He had stopped wearing it due to low activity.      Mood:  Mood was not discussed this call.    In the last week, Adam Keith reports no new stressors.  Adam Keith was pleased with the outcome of the visit with his clinical team last week.  He feels and sounded much more settled with the plan going forward.  Much of his anxiety was diffused knowing that he won???t be changing medications.      Meaning:  Meaning and meaning-making were discussed during this call.  Adam Keith continues to find joy in woodworking.  He enjoys being in his woodshop and making things for the people he cares about.  He is looking forward to completing the gifts for his colleagues and made a goal to complete a part of them this week.  This is a 10/10 on the importance and confidence scales for him.    Meal prep and managing his diet are less important to him now.  He feels like he wants to enjoy the rest of his life and that dieting is not part of that picture.  Next call: December 12 10:00 am

## 2022-03-14 NOTE — Unmapped (Signed)
[facility] Refill Coordination Note    Specialty Medication(s) to be Shipped:   Hematology/Oncology: Iclusig    Other medication(s) to be shipped: No additional medications requested for fill at this time     Adam Keith, DOB: [birth year]  Phone: [phone] (home)       All above HIPAA information was verified with patient.     Was a Nurse, learning disability used for this call? No    Completed refill call assessment today to schedule patient's medication shipment from the [facility] [phone]).  All relevant notes have been reviewed.     Specialty medication(s) and dose(s) confirmed: Regimen is correct and unchanged.   Changes to medications: Adam Keith reports no changes at this time.  Changes to insurance: No  New side effects reported not previously addressed with a pharmacist or physician: None reported  Questions for the pharmacist: No    Confirmed patient received a Conservation officer, historic buildings and a Surveyor, mining with first shipment. The patient will receive a drug information handout for each medication shipped and additional FDA Medication Guides as required.       DISEASE/MEDICATION-SPECIFIC INFORMATION        N/A    SPECIALTY MEDICATION ADHERENCE     Medication Adherence    Patient reported X missed doses in the last month: 0  Specialty Medication: Iclusig 30mg   Patient is on additional specialty medications: No  Informant: patient                       Were doses missed due to medication being on hold? No    Iclusig 30 mg: 10 days of medicine on hand       REFERRAL TO PHARMACIST     Referral to the pharmacist: Not needed      [facility]     Shipping address confirmed in Epic.     Delivery Scheduled: Yes, Expected medication delivery date: 03/22/22.     Medication will be delivered via UPS to the prescription address in Epic [place].    [name]   [facility] Specialty Technician

## 2022-03-17 MED ORDER — BUPRENORPHINE 20 MCG/HOUR WEEKLY TRANSDERMAL PATCH
MEDICATED_PATCH | TRANSDERMAL | 0 refills | 28 days | Status: CP
Start: 2022-03-17 — End: 2022-04-14

## 2022-03-17 NOTE — Unmapped (Signed)
Participant: Adam Keith  Coach: Hansel Starling    This is a 41 year old with a history of acute leukemia, currently being treated with panatinib. The participant reports that since the last call, there have been no unplanned ED visits. The participant reports that since the last call, there were discussions with the clinical team about treatment planning or about the overall disease course. He has not been meeting his goals since the last call.      Coaching Call Review 03/07/22  Adam Keith sounded tired during today???s call.  He reported that my phone call woke him up.  He reported that he had fun at the Christmas party, but did not complete the gifts like he had planned.  His machine broke and that was frustrating for him, but he is unwilling to have it fixed before Christmas.     Weekly Report for week starting 03/15/2022  Please click below to view a more detailed report on our [employer] website:  weekly report 03/14/2022 * Please hold the shift key when clicking the hyperlink to access your patient???s report      New SMART Goals:     Movement:   Domain discussed: no  No goals    Mood:   Domain discussed: yes   No goals      Meaning:   Domain discussed: Yes  SMART Goal 1: Adam Keith will complete the remainder of his Christmas gifts by Christmas  SMART Goal 1: Developed by participant and coach together       Coaching Call Detailed Notes 03/15/22       Symptoms:   Adam Keith continues to have pain, which is managed by his pain patch.  He reports that it helps but doesn???t really make it go away.  He reported insomnia this week due to sleeping longer than he wants.  He has had no change in sleeping habits and wakes up feeling ???normal??? when he sleeps later.  He did not want to set a goal for helping him wake up earlier.        Movement:   Movement was not discussed this call.   In the last week [name] completed 0 bouts of intentional exercise. [name] challenged his sedentary time by: working in his workshop.     Adam Keith continues to work and has a physically demanding job. His pain is the biggest barrier related to not moving or completing his woodworking goals.  He declined brainstorming about tools to manage his pain and complete his goals.  His approach is to ???gut it out??? and just keep working through the pain.     He has not been wearing his FitBit.       Mood:   Mood was discussed this call.    In the last week, Adam Keith reports no new stressors.  Adam Keith reported that he is not using any tools to manage his stress, he declined connection to [facility] and brainstorming about methods for stress management.     Insomnia is possibly affecting mood.  Adam Keith has struggled with his pain and sleep.  He isn???t sure what is causing his increased fatigue that is making him sleep later, and he is frustrated that sleeping later makes him not as productive during the day.  Adam Keith prefers to ???just lay there??? and try to fall asleep rather than create a new routine to help support good sleep hygiene.  He mentioned that his pain is may keep him awake, and he is no longer taking his oxy  to help with his pain management.     [name] did not set a SMART goal to help with the management of this issue. SMART goal was not set due to: Adam Keith did not want to continue the conversation about setting a SMART goal to help with stress and pain management.      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  He is stressed about completing this Christmas gifts on time since there are factors outside of his control that make it hard (his machine malfunctioning).  Adam Keith continues to plan to complete his woodworking as his goal this week and feels that his machine is the barrier to his success and he cannot fix it right now.         Meaning:   Meaning and meaning-making were discussed during this call. Adam Keith continues to find joy in woodworking.  He enjoys being in his woodshop and making things for the people he cares about.  He is looking forward to completing the gifts from his Christmas orders.  This is a 10/10 on the importance and confidence scales for him.     He is looking fowrad to spending time with his family after Christmas.  He and his wife are both working on Christmas, so he will be with his kids after.  He does not have any traditions or meaningful activities that he does on Christmas except to be with his family.     Meal prep and managing his diet are less important to him now.  He feels like he wants to enjoy the rest of his life and that dieting is not part of that picture.         Next call: January 3 10:00 am

## 2022-03-21 MED FILL — ICLUSIG 30 MG TABLET: ORAL | 30 days supply | Qty: 30 | Fill #3

## 2022-04-04 MED ORDER — APIXABAN 5 MG TABLET
ORAL_TABLET | Freq: Two times a day (BID) | ORAL | 6 refills | 30 days | Status: CP
Start: 2022-04-04 — End: ?

## 2022-04-06 ENCOUNTER — Institutional Professional Consult (permissible substitution): Admit: 2022-04-06 | Discharge: 2022-04-07 | Payer: MEDICAID | Attending: Registered" | Primary: Registered"

## 2022-04-07 LAB — COMPREHENSIVE METABOLIC PANEL
ALBUMIN: 4.1 g/dL (ref 3.4–5.0)
ALKALINE PHOSPHATASE: 90 U/L (ref 46–116)
ALT (SGPT): 53 U/L — ABNORMAL HIGH (ref 10–49)
ANION GAP: 6 mmol/L (ref 5–14)
BILIRUBIN TOTAL: 0.4 mg/dL (ref 0.3–1.2)
BLOOD UREA NITROGEN: 13 mg/dL (ref 9–23)
BUN / CREAT RATIO: 8
CALCIUM: 9.9 mg/dL (ref 8.7–10.4)
CHLORIDE: 106 mmol/L (ref 98–107)
CO2: 25 mmol/L (ref 20.0–31.0)
CREATININE: 1.66 mg/dL — ABNORMAL HIGH
EGFR CKD-EPI (2021) MALE: 52 mL/min/{1.73_m2} — ABNORMAL LOW (ref >=60)
GLUCOSE RANDOM: 105 mg/dL (ref 70–179)
PROTEIN TOTAL: 6.5 g/dL (ref 5.7–8.2)
SODIUM: 137 mmol/L (ref 135–145)

## 2022-04-07 LAB — BLOOD GAS, VENOUS
BASE EXCESS VENOUS: 3.9 — ABNORMAL HIGH (ref -2.0–2.0)
HCO3 VENOUS: 26 mmol/L (ref 22–27)
O2 SATURATION VENOUS: 61.4 % (ref 40.0–85.0)
PCO2 VENOUS: 54 mmHg (ref 40–60)
PH VENOUS: 7.35 (ref 7.32–7.43)
PO2 VENOUS: 34 mmHg (ref 30–55)

## 2022-04-07 LAB — CBC W/ AUTO DIFF
HEMATOCRIT: 48.4 % — ABNORMAL HIGH (ref 39.0–48.0)
HEMOGLOBIN: 16.7 g/dL — ABNORMAL HIGH (ref 12.9–16.5)
MEAN CORPUSCULAR HEMOGLOBIN CONC: 34.5 g/dL (ref 32.0–36.0)
MEAN CORPUSCULAR HEMOGLOBIN: 29.7 pg (ref 25.9–32.4)
MEAN CORPUSCULAR VOLUME: 86.3 fL (ref 77.6–95.7)
MEAN PLATELET VOLUME: 8.7 fL (ref 6.8–10.7)
PLATELET COUNT: 197 10*9/L (ref 150–450)
RED BLOOD CELL COUNT: 5.6 10*12/L (ref 4.26–5.60)
RED CELL DISTRIBUTION WIDTH: 15.3 % — ABNORMAL HIGH (ref 12.2–15.2)

## 2022-04-07 LAB — HIGH SENSITIVITY TROPONIN I - SERIAL: HIGH SENSITIVITY TROPONIN I: 6 ng/L (ref <=53)

## 2022-04-07 LAB — PROTIME-INR
INR: 0.91
PROTIME: 10.2 s (ref 9.9–12.6)

## 2022-04-07 NOTE — Unmapped (Unsigned)
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 male seen for medical nutrition therapy for nutrition during cancer treatment. 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 His active problem list, medication list, allergies, family history, social history, notes from last a few encounters, and lab results were reviewed.     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    Weight history:  Wt Readings from Last 6 Encounters:   03/03/22 (!) 135.3 kg (298 lb 4.5 oz)   02/22/22 (!) 136.1 kg (300 lb)   02/06/22 (!) 136.1 kg (300 lb)   01/21/22 (!) 131.7 kg (290 lb 5.5 oz)   12/30/21 (!) 127 kg (280 lb)   12/28/21 (!) 132.5 kg (292 lb 1.8 oz)       Nutrition Risk Screening:   Food Insecurity: No Food Insecurity (03/08/2021)    Hunger Vital Sign     Worried About Running Out of Food in the Last Year: Never true     Ran Out of Food in the Last Year: Never true        Physical Activity:  Physical activity level is light with some exercise.     Nutrition Focused Physical Exam:    Nutrition Evaluation  Overall Impressions: Nutrition-Focused Physical Exam not indicated due to lack of malnutrition risk factors. (04/07/22 1238)  Nutrition Designation: Obese class II  (BMI 35.00 - 39.99 kg/m2) (04/07/22 1238)     Malnutrition Assessment using AND/ASPEN Clinical Characteristics:    Patient does not meet AND/ASPEN criteria for malnutrition at this time (04/07/22 1239)      Biochemical Data, Medical Tests and Procedures:  No recent pertinent labs or other nutritionally relevant data available for review.    Medications and Vitamin/Mineral Supplementation:   All nutritionally pertinent medications reviewed on 04/06/2022.   Nutritionally pertinent medications include: Remeron  He is not taking nutrition supplements.     Current Outpatient Medications   Medication Sig Dispense Refill    amLODIPine (NORVASC) 10 MG tablet Take 1 tablet (10 mg total) by mouth daily. 30 tablet 0    apixaban (ELIQUIS) 5 mg Tab Take 1 tablet (5 mg total) by mouth two (2) times a day. 60 tablet 6    arm brace (WRIST BRACE) Misc 1 Piece by Miscellaneous route as needed. left ulnar wrist brace 1 each 0    buprenorphine 20 mcg/hour PTWK transdermal patch Place  1 patch on the skin every seven (7) days for 28 days. 4 patch 0    carvediloL (COREG) 12.5 MG tablet Take 1 tablet (12.5 mg total) by mouth Two (2) times a day. 180 tablet 1    docusate sodium (COLACE) 100 MG capsule Take 1 capsule (100 mg total) by mouth daily.      doxycycline (VIBRA-TABS) 100 MG tablet Take 1 tablet (100 mg total) by mouth Two (2) times a day. 180 tablet 3    gabapentin (NEURONTIN) 300 MG capsule Take 2 capsules (600mg ) by mouth in the morning and 3 capsules (900mg ) at bedtime. May cause drowsiness. 150 capsule 5    hydroCHLOROthiazide (HYDRODIURIL) 12.5 MG tablet Take 1 tablet (12.5 mg total) by mouth daily. 30 tablet 11    mirtazapine (REMERON) 15 MG tablet TAKE 1 TABLET BY MOUTH EVERY DAY AT NIGHT 90 tablet 4    nortriptyline (PAMELOR) 50 MG capsule TAKE 2 CAPSULES BY MOUTH NIGHTLY. 60 capsule 11    omeprazole (PRILOSEC) 40 MG capsule Take 1 capsule (40 mg total) by mouth every morning.      PONATinib (ICLUSIG) 30 mg tablet Take 1 tablet (30 mg total) by mouth daily. Swallow tablets whole. Do not crush, break, cut or chew tablets. 30 tablet 5    spironolactone (ALDACTONE) 25 MG tablet Take 1 tablet (25 mg total) by mouth daily. 90 tablet 3    sulfamethoxazole-trimethoprim (BACTRIM DS) 800-160 mg per tablet TAKE 1 TABLET BY MOUTH 2 TIMES A DAY ON SATURDAY, SUNDAY. FOR PROPHYLAXIS WHILE ON CHEMO. 48 tablet 3    umeclidinium-vilanteroL (ANORO ELLIPTA) 62.5-25 mcg/actuation inhaler Inhale 1 puff daily. 60 each 11    valACYclovir (VALTREX) 500 MG tablet Take 1 tablet (500 mg total) by mouth daily. 90 tablet 11     No current facility-administered medications for this visit.       Nutrition History:     Dietary Restrictions: He reported food allergies to onions.    Gastrointestinal Issues: Denied issues    Oral Issues: taste changes- sour/cardboard taste; sugary foods taste best.    Hunger and Satiety: Denied issues.     Food Safety and Access: He did not report issues.     Other: fatigue      Diet Recall:   Breakfast: typically skips  Lunch: 12 inch sub (Italian/meatball) sandwich from [employer], combo meal with Junior cheeseburger from [employer] or hibachi meal from [employer]  Dinner: [employer] or chicken cordon bleu, or homemade veggie soup  Snack(s): chips, sweets, Cheez-Its with peanut butter, cottage cheese, or string cheese  Beverages: 40 oz Mountain Dew, 32 oz coffee with creamer and sugar, occasional water  Alcohol: none reported  Supplements: none reported  Enteral feedings: no feeding tube    Pt reports increased appetite since starting Remeron; plans to discuss tapering down with provider. Attempted to try fruit and veggies for snacks, though unable to sustain due to preference for higher calorie snacks such as chips. Does not become easily full when eating, thus opting for second servings. Typically consumes meals in less than 15 minutes; attributes fast-paced eating to nature of job.  Nutrition Assessment     Food- and nutrition-related knowledge deficit related to healthy eating habits as evidenced by dietary recall, anthropometrics.    Daily Estimated Nutrient Needs:  Energy: 25-30 kcal/kg using ideal body weight, 86.4 kg (04/07/22 1238)]  Protein: [0.8-1 gm/kg using ideal body weight, 86.4 kg (04/07/22 1238)]  Carbohydrate: [no restriction]  Fluid: [1 mL/kcal (  maintenance)]     Nutrition Goals & Evaluation    - Increase intake of fruit/veggies at snacks.  (Not Met)  - Increase knowledge regarding management of taste changes. (Met and Ongoing)  - Practice mindful eating habits at dinner. (New)      Nutrition goals reviewed, and relevant barriers identified and addressed: desire/motivation. He is evaluated to have good willingness and ability to achieve nutrition goals.   Nutrition Intervention      - Nutrition Education: mindful eating diet/eating style. Education resources provided include: handouts promoting nutrition intervention  and contact information   - Nutrition Counseling: Motivational Interviewing Goal setting Problem solving    Nutrition Plan:   Practice mindful eating during dinner meal by using hunger/satiety scale. Try to stay between 4 and 6 on scale. Check in with self throughout meal to determine level of satiety. May slow down pace of eating by using chopsticks or taking frequent sips of water.  Discussed strategies to manage taste changes such as including fruit with meals, adding lemon/lime juice, and using fruit/vinegar-based sauces/dressings.      Materials Provided: Hunger and Fullness Scale      Follow up will occur in 3 months, or as needed.       Food/Nutrition-related history, Anthropometric measurements, Biochemical data, medical tests, procedures, Nutrition-focused physical findings, Patient understanding or compliance with intervention and recommendations , and Effectiveness of nutrition interventions will be assessed at time of follow-up.     Recommendations for Clinical Team, Care Team , and Referring Provider :  - Encourage pt to follow nutrition plan as noted above.     Time spent *** minutes   {    Coding tips - Do not edit this text, it will delete upon signing of note!    Telephone visits [phone] for Physicians and APPs and [phone] for Non- Physician Clinicians)- Only use minutes on the phone to determine level of service.    Video visits [phone]) - Use either level of medical decision making just as an in-person visit OR time which includes both minutes on video and pre/post minutes to determine the level of service.      :[id number]}  The patient reports they are physically located in [place] and is currently: {patient location:81390}. I conducted a {phone audio video visit:101857} .     The patient {[place] Attestations Was/Was Not:71380} located and I {[place] Attestations Was/Was Not:71380} located within 250 yards of a hospital-based location during the {phone audio video visit:67489}.

## 2022-04-07 NOTE — Unmapped (Signed)
Addended by: Tenny Craw B on: 04/07/2022 02:44 PM     Modules accepted: Orders

## 2022-04-08 ENCOUNTER — Ambulatory Visit: Admit: 2022-04-08 | Discharge: 2022-04-08 | Disposition: A | Discharge status: Home / Self Care | Payer: MEDICAID

## 2022-04-08 ENCOUNTER — Emergency Department: Admit: 2022-04-08 | Discharge: 2022-04-08 | Disposition: A | Discharge status: Home / Self Care | Payer: MEDICAID

## 2022-04-08 LAB — CBC W/ AUTO DIFF
BASOPHILS ABSOLUTE COUNT: 0.1 10*9/L (ref 0.0–0.1)
BASOPHILS RELATIVE PERCENT: 1.4 %
EOSINOPHILS ABSOLUTE COUNT: 0.2 10*9/L (ref 0.0–0.5)
EOSINOPHILS RELATIVE PERCENT: 2.1 %
HEMATOCRIT: 48.4 % — ABNORMAL HIGH (ref 39.0–48.0)
HEMOGLOBIN: 16.7 g/dL — ABNORMAL HIGH (ref 12.9–16.5)
LYMPHOCYTES ABSOLUTE COUNT: 2.5 10*9/L (ref 1.1–3.6)
LYMPHOCYTES RELATIVE PERCENT: 22.8 %
MEAN CORPUSCULAR HEMOGLOBIN CONC: 34.5 g/dL (ref 32.0–36.0)
MEAN CORPUSCULAR HEMOGLOBIN: 29.7 pg (ref 25.9–32.4)
MEAN CORPUSCULAR VOLUME: 86.3 fL (ref 77.6–95.7)
MEAN PLATELET VOLUME: 8.7 fL (ref 6.8–10.7)
MONOCYTES ABSOLUTE COUNT: 1 10*9/L — ABNORMAL HIGH (ref 0.3–0.8)
MONOCYTES RELATIVE PERCENT: 8.7 %
NEUTROPHILS ABSOLUTE COUNT: 7.1 10*9/L (ref 1.8–7.8)
NEUTROPHILS RELATIVE PERCENT: 65 %
PLATELET COUNT: 197 10*9/L (ref 150–450)
RED BLOOD CELL COUNT: 5.6 10*12/L (ref 4.26–5.60)
RED CELL DISTRIBUTION WIDTH: 15.3 % — ABNORMAL HIGH (ref 12.2–15.2)
WBC ADJUSTED: 10.9 10*9/L (ref 3.6–11.2)

## 2022-04-08 LAB — SLIDE REVIEW

## 2022-04-08 MED ADMIN — HYDROmorphone (PF) (DILAUDID) injection 0.5 mg: 0.5 mg | INTRAVENOUS | @ 04:00:00 | Stop: 2022-04-07

## 2022-04-08 MED ADMIN — iohexol (OMNIPAQUE) 350 mg iodine/mL solution 75 mL: 75 mL | INTRAVENOUS | @ 05:00:00 | Stop: 2022-04-07

## 2022-04-08 MED ADMIN — HYDROmorphone (PF) (DILAUDID) injection 1 mg: 1 mg | INTRAVENOUS | @ 05:00:00 | Stop: 2022-04-08

## 2022-04-08 NOTE — Unmapped (Signed)
Patient reports SOB, chest heaviness. History of leukemia, on active chemo. Patient had PE last month, does take eliquis.

## 2022-04-08 NOTE — Unmapped (Signed)
Summary Statement:    This is a 42 year old with a history of acute leukemia, currently being treated with panatinib. The participant reports that since the last call, there have been no unplanned ED visits. The participant reports that since the last call, there were discussions with the clinical team about treatment planning or about the overall disease course. He has not been meeting his goals since the last call.      Coaching Call Review 04/06/2022  This was our first call in two weeks, Adam Keith sounded more upbeat during today???s call.  He was at work and has been working more frequently over the holidays.  He reported being able to spend time with his family during the holidays and is looking forward to the new year and his woodworking goals. Adam Keith was working during our call and had to go abruptly due to work, so no goals were set this week.     Weekly Report for week starting 04/05/2022  Please click below to view a more detailed report on our [employer] website:  weekly report 04/05/2022 * Please hold the shift key when clicking the hyperlink to access your patient???s report     The following symptoms have been flagged with an alert this week 04/05/2022:  Pain    New SMART Goals:     Movement:   Domain discussed: no          Goal: Talk to care team about medical clearance for [employer].     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     Coaching Call Detailed Notes 04/06/22       Symptoms:   Adam Keith continues to have pain, shortness of breath, and coughing.  He reported that he has been battling a cold/cough for about 3 weeks.  He does not want to go to the hospital.  He reports lung pain when breathing and near fainting during coughing spells.  He has been taking Mucinex for three weeks.  He has emailed his team earlier today and he is waiting for a response.  The coordinator has already called him.  He is unlikely to go to the hospital.      Movement:   Movement was discussed this call.   In the last week Adam Keith completed 0 bouts of intentional exercise. Adam Keith challenged his sedentary time by: working in his workshop.  Adam Keith expressed interest in doing a workout that he saw on Facebook.   I provided information about [employer] providing him with a workout or joining [employer] online.  He is interested in joining [employer].   Currently, his pain and shortness of breath are barriers to exercise.  He plans to discuss with his care team.     Adam Keith continues to work and has a physically demanding job.  He is not interested in exercise at this time, but is interested in remaining functional enough to do his woodworking.      He has not been wearing his FitBit.       Mood:   Mood was discussed this call.    In the last week, Adam Keith reports no new stressors.  Adam Keith reported that he is not using any tools to manage his stress.  Adam Keith is anticipating the anniversary of his  son???s death and the family stress that comes with that day.  He plans to go out of town with his wife around that time in February.     Adam Keith???s primary form of stress management is to just keep going. He reported that he knows this is ???not healthy??? but it is ???working for now???.  He did not want to focus on developing new stress management tools.      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 He felt like he needed some guidance around what he could discuss with me and he settled on focusing on his woodworking goals.  Details are included in the meaning section below.     Meaning:   Meaning and meaning-making were discussed during this call.  Adam Keith continues to find joy in woodworking.  He enjoys being in his wood shop and making things for the people he cares about.  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      Woodworking is his only mechanism for stress management and self-care.  Additionally, woodworking adds meaning to his life by allowing him to create art for his customers and provide for his family. We talked about how woodworking can support wellbeing, but it can also be too much at times, causing him stress and physical fatigue.  His plan is to just continue on regardless of how his body feels.       Next call: January 11,  11 am

## 2022-04-08 NOTE — Unmapped (Signed)
Pt arrives w/ C/C of SOB, chest pain to the R side of chest going on for a week. Hx of chemo, hx of blood clots and is on blood thinners. Appears is respiratory distress.

## 2022-04-08 NOTE — Unmapped (Signed)
[facility] Emergency Department Provider Note      ED Clinical Impression     Final diagnoses:   Chest pain, unspecified type (Primary)       HPI, ED Course, Assessment and Plan     Initial Clinical Impression:    April 08, 2022 1:23 AM   Adam Keith is a 42 y.o. 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  Patient reports over the past week he has had some intermittent cough.  Today, patient had acute chest pain that he localizes to the right chest wall.  He denies any fevers, congestion, sore throat, shortness of breath, diaphoresis, nausea, vomiting, abdominal pain, or back pain.     BP 158/88  - Pulse 86  - Temp 36.8 ??C (98.3 ??F) (Oral)  - Resp 16  - SpO2 92%     MDM  On exam, patient is well-appearing in no acute distress.  Vitals without significant abnormalities.  Heart is regular rate and rhythm.  Lungs are clear to auscultation bilaterally no evidence of respiratory distress.  Lungs are clear to auscultation bilaterally.  No significant pain to palpation over the chest wall no overlying rashes or skin changes.  Abdomen is otherwise soft, nondistended, and nontender to palpation.      Differential diagnosis includes ACS, aortic dissection, myocarditis/pericarditis, PE, pneumothorax, Boerhaave syndrome.  Aortic dissection unlikely given normal chest x-ray without aortic widening, no pulse deficits, no new murmurs.  Myocarditis/pericarditis unlikely given patient's normal vital signs, afebrile, normal cardiac exam.  Obtain a CTA chest given his elevated risk of PE with cancer and prior PEs although he is not anticoagulated that showed no evidence of recurrent PE.  No evidence of pneumonia or pneumothorax on CT or chest x-ray.  Boerhaave syndrome unlikely given lack of retching, nor physical exam, description of chest pain.     Proceed ACS workup with overall reassuring labs including 2 negative troponins and EKG that shows normal sinus rhythm at a rate of 91 that is unchanged from prior.  No evidence of acute infarct or ischemia.  Remaining labs without any electrolyte abnormalities or anemia    Given reassuring workup and pain overall improved with pain medication, will discharge patient home with continued outpatient follow-up.  Discussed tricked return precautions.  Patient expressed understanding agreeable with this discharge plan.      Social Determinants of Health with Concerns     Internet Connectivity: Not on file   Tobacco Use: High Risk (04/07/2022)    Patient History     Smoking Tobacco Use: Every Day     Smokeless Tobacco Use: Former     Passive Exposure: Current   Alcohol Use: Not on file   Substance Use: Not on file   Health Literacy: Not on file   Physical Activity: Not on file   Interpersonal Safety: Not on file   Stress: Not on file   Intimate Partner Violence: Not on file   Depression: Not on file   Social Connections: Not on file     _____________________________________________________________________    The case was discussed with the attending physician who is in agreement with the above assessment and plan    Additional Medical Decision  Making     I have reviewed the vital signs and the nursing notes. Labs and radiology results that were available during my care of the patient were independently reviewed by me and considered in my medical decision making.   I independently visualized the EKG tracing if performed  I independently visualized the radiology images if performed  I reviewed the patient's prior medical records if available.  Additional history obtained from family if available.  For specific reads/information impacting care please refer to MDM/ED Course continued documentation    Past History     PAST MEDICAL HISTORY/PAST SURGICAL HISTORY:   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    Septic shock due to Staphylococcus aureus (CMS-HCC) 01/28/2020    Tear of medial meniscus of knee 10/25/2016    Testicular pain, left 01/21/2022       Past Surgical History:   Procedure Laterality Date    BONE MARROW BIOPSY & ASPIRATION  01/21/2020         CHG [place], CHEST,REAL TIME  11/20/2020    Procedure: ULTRASOUND, CHEST, REAL TIME WITH IMAGE DOCUMENTATION;  Surgeon: Jerelyn Charles, MD;  Location: [facility];  Service: Pulmonary    IR INSERT PORT AGE GREATER THAN 5 YRS  03/10/2020    IR INSERT PORT AGE GREATER THAN 5 YRS 03/10/2020 [name], MD IMG [facility]    PR BRONCHOSCOPY,DIAGNOSTIC W LAVAGE Bilateral 07/20/2020    Procedure: BRONCHOSCOPY, RIGID OR FLEXIBLE, INCLUDE FLUOROSCOPIC GUIDANCE WHEN PERFORMED; W/BRONCHIAL ALVEOLAR LAVAGE WITH MODERATE SEDATION;  Surgeon: Dellis Filbert, MD;  Location: [facility];  Service: Pulmonary       MEDICATIONS:     Current Facility-Administered Medications:     aspirin chewable tablet 324 mg, 324 mg, Oral, Once, Jocelyn Lamer, MD    Current Outpatient Medications:     amLODIPine (NORVASC) 10 MG tablet, Take 1 tablet (10 mg total) by mouth daily., Disp: 30 tablet, Rfl: 0    apixaban (ELIQUIS) 5 mg Tab, Take 1 tablet (5 mg total) by mouth two (2) times a day., Disp: 60 tablet, Rfl: 6    arm brace (WRIST BRACE) Misc, 1 Piece by Miscellaneous route as needed. left ulnar wrist brace, Disp: 1 each, Rfl: 0    buprenorphine 20 mcg/hour PTWK transdermal patch, Place 1 patch on the skin every seven (7) days for 28 days., Disp: 4 patch, Rfl: 0    carvediloL (COREG) 12.5 MG tablet, Take 1 tablet (12.5 mg total) by mouth Two (2) times a day., Disp: 180 tablet, Rfl: 1    docusate sodium (COLACE) 100 MG capsule, Take 1 capsule (100 mg total) by mouth daily., Disp: , Rfl:     doxycycline (VIBRA-TABS) 100 MG tablet, Take 1 tablet (100 mg total) by mouth Two (2) times a day., Disp: 180 tablet, Rfl: 3    gabapentin (NEURONTIN) 300 MG capsule, Take 2 capsules (600mg ) by mouth in the morning and 3 capsules (900mg ) at bedtime. May cause drowsiness., Disp: 150 capsule, Rfl: 5    hydroCHLOROthiazide (HYDRODIURIL) 12.5 MG tablet, Take 1 tablet (12.5 mg total) by mouth daily., Disp: 30 tablet, Rfl: 11    mirtazapine (REMERON) 15 MG tablet, TAKE 1 TABLET BY MOUTH EVERY DAY  AT NIGHT, Disp: 90 tablet, Rfl: 4    nortriptyline (PAMELOR) 50 MG capsule, TAKE 2 CAPSULES BY MOUTH NIGHTLY., Disp: 60 capsule, Rfl: 11    omeprazole (PRILOSEC) 40 MG capsule, Take 1 capsule (40 mg total) by mouth every morning., Disp: , Rfl:     PONATinib (ICLUSIG) 30 mg tablet, Take 1 tablet (30 mg total) by mouth daily. Swallow tablets whole. Do not crush, break, cut or chew tablets., Disp: 30 tablet, Rfl: 5    spironolactone (ALDACTONE) 25 MG tablet, Take 1 tablet (25 mg total) by mouth daily., Disp: 90 tablet, Rfl: 3    sulfamethoxazole-trimethoprim (BACTRIM DS) 800-160 mg per tablet, TAKE 1 TABLET BY MOUTH 2 TIMES A DAY ON SATURDAY, SUNDAY. FOR PROPHYLAXIS WHILE ON CHEMO., Disp: 48 tablet, Rfl: 3    umeclidinium-vilanteroL (ANORO ELLIPTA) 62.5-25 mcg/actuation inhaler, Inhale 1 puff daily., Disp: 60 each, Rfl: 11    valACYclovir (VALTREX) 500 MG tablet, Take 1 tablet (500 mg total) by mouth daily., Disp: 90 tablet, Rfl: 11    ALLERGIES:   Bupropion hcl, Cefepime, Ceftaroline fosamil, Dapsone, Onion, Vancomycin analogues, Bismuth subsalicylate, Privigen [immun glob g(igg)-pro-iga 0-50], Furosemide, and Gammagard    SOCIAL HISTORY:   Social History     Tobacco Use    Smoking status: Every Day     Current packs/day: 2.00     Types: Cigarettes     Passive exposure: Current    Smokeless tobacco: Former     Types: Chew   Substance Use Topics    Alcohol use: Yes     Comment: Rarely       FAMILY HISTORY:  Family History   Problem Relation Age of Onset    Cancer Mother     No Known Problems Father     No Known Problems Sister     No Known Problems Brother     No Known Problems Maternal Aunt     No Known Problems Maternal Uncle     No Known Problems Paternal Aunt     No Known Problems Paternal Uncle     No Known Problems Maternal Grandmother     Cancer Maternal Grandfather     No Known Problems Paternal Grandmother     No Known Problems Paternal Grandfather     Melanoma Neg Hx     Basal cell carcinoma Neg Hx     Squamous cell carcinoma Neg Hx           Review of Systems     A review of systems was performed and relevant portions were as noted above in HPI     Physical Exam     VITAL SIGNS:    BP 158/88  - Pulse 86  - Temp 36.8 ??C (98.3 ??F) (Oral)  - Resp 16  - SpO2 92%     Constitutional:   Alert and oriented.   Head:   Normocephalic and atraumatic  Eyes:   Conjunctivae are normal, EOMI, PERRL  ENT:   No notable congestion, Mucous membranes moist, External ears normal, no notable stridor  Cardiovascular:   Rate as vitals above. Appears warm and well perfused.   Respiratory:   Normal respiratory effort. Breath sounds are normal.  Gastrointestinal:   Soft, non-distended, and nontender.   Genitourinary:   Deferred  Musculoskeletal:    Normal range of motion in all extremities. No tenderness or edema noted in B/L lower extremities  Neurologic:   No gross focal neurologic deficits beyond baseline are appreciated.  Skin:  Skin is warm, dry and intact.       Radiology     CTA Chest W Contrast   Preliminary Result      No emboli and no acute findings.      XR Chest 2 views   Final Result      No acute abnormality.          Labs     Labs Reviewed   COMPREHENSIVE METABOLIC PANEL - Abnormal; Notable for the following components:       Result Value    Creatinine 1.66 (*)     eGFR CKD-EPI (2021) Male 23 (*)     ALT 53 (*)     All other components within normal limits   BLOOD GAS, VENOUS - Abnormal; Notable for the following components:    Base Excess, Ven 3.9 (*)     All other components within normal limits   CBC W/ AUTO DIFF - Abnormal; Notable for the following components:    HGB 16.7 (*)     HCT 48.4 (*)     RDW 15.3 (*)     All other components within normal limits    Narrative:     WBC, PLT and/or Differential requires smear review. Expected TAT is 2 hours.   HIGH SENSITIVITY TROPONIN I - SERIAL - Normal   PROTIME-INR   CBC W/ DIFFERENTIAL    Narrative:     The following orders were created for panel order CBC w/ Differential.                  Procedure                               Abnormality         Status                                     ---------                               -----------         ------                                     CBC w/ Differential[[id number]]         Abnormal            Preliminary result                                           Please view results for these tests on the individual orders.   HIGH SENSITIVITY TROPONIN I - 2 HOUR SERIAL   POTASSIUM   AST   HIGH SENSITIVITY TROPONIN I - 6 HOUR SERIAL         Pertinent labs & imaging results that were available during my care of the patient were reviewed by me and considered in my medical decision making (see chart for details).    Please note- This chart has been created using [employer]. Chart creation errors have been sought, but may not always be located and such creation errors, especially pronoun confusion, do  NOT reflect on the standard of medical care.       Magda Bernheim, MD  Resident  04/08/22 [phone]

## 2022-04-11 ENCOUNTER — Ambulatory Visit
Admit: 2022-04-11 | Discharge: 2022-04-12 | Payer: MEDICAID | Attending: Student in an Organized Health Care Education/Training Program | Primary: Student in an Organized Health Care Education/Training Program

## 2022-04-11 ENCOUNTER — Ambulatory Visit: Admit: 2022-04-11 | Discharge: 2022-04-12 | Payer: MEDICAID

## 2022-04-11 DIAGNOSIS — J449 Chronic obstructive pulmonary disease, unspecified: Principal | ICD-10-CM

## 2022-04-11 DIAGNOSIS — C9101 Acute lymphoblastic leukemia, in remission: Principal | ICD-10-CM

## 2022-04-11 MED ORDER — STIOLTO RESPIMAT 2.5 MCG-2.5 MCG/ACTUATION SOLUTION FOR INHALATION
Freq: Every day | RESPIRATORY_TRACT | 3 refills | 14 days | Status: CP
Start: 2022-04-11 — End: ?

## 2022-04-11 MED ORDER — AEROBIKA OSCILLATING PEP SYSTEM DEVICE
Freq: Once | 0 refills | 0 days | Status: CP
Start: 2022-04-11 — End: 2022-04-11

## 2022-04-11 MED ORDER — GLYCOPYRROLATE 9 MCG-FORMOTEROL 4.8 MCG HFA AEROSOL INHALER
Freq: Two times a day (BID) | RESPIRATORY_TRACT | 0 refills | 30 days | Status: CN
Start: 2022-04-11 — End: ?

## 2022-04-11 NOTE — Unmapped (Signed)
Hello Adam Keith,    It was great meeting you today. Please see our plan from today below:  - START the new Stiolto inhaler 2 puffs every day  - Please use the spacer with the albuterol   - Use the aerobika or flutter valve to help move your secretions in your chest up so that you can cough it out  - We collected a genetic test from you today to see if your underlying lung disease has a genetic component  - STOP Anoro   - Please complete your lung function test prior to your next appointment.    Please return to clinic in 3 months.     If you have any non-urgent questions or concerns please reach out by calling the clinic [phone]) or via MyChart.    Frieda Arnall Griffith Citron, DO  Pulmonary and Critical Care Fellow

## 2022-04-11 NOTE — Unmapped (Signed)
Pulmonary Clinic - Follow-up Visit    Referring Physician :  Dierdre Harness  PCP:     Dierdre Harness, MD  Reason for Consult:   DOE      ASSESSMENT and PLAN     Adam Keith is a 42 y.o. 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 Will optimize on LABA/LAMA therapy with Stiolto (was not taking Anoro due to being powdery). On Albuterol with spacer PRN.   - Discontinue Anoro (due to being to powdery)  - Prescribed Stiolto   - Continue Albuterol PRN  - Reviewed inhaler technique; spacer given today  - Pulmonary Rehab: declines  - Lung Cancer Screening: not eligible  - Vaccines: declines  - Smoking cessation: Strongly encouraged but he declines.   - Advanced therapies? Not indicated by PFTs  - START airway clearance with flutter valve to improve airway clearance. Consider nebs in the future.  - alpha 1 anti-trypsin testing sent today 04/11/22  - RTC in 3 months with PFT (Flow volumes, DLCO)    RUL architectural 7distortion with associated scarring   Prior history of RUL MRSA PNA, pleural empyema  - most recent CTA 04/07/22 reviewed. Stable findings c/w scarring.    OSA  - Hypercarbia/NIPPV screen: Declines currently (trialed CPAP in hospital, could not tolerate face mask, is a mouth breather).  - Would like to lose some weight to help with his sleep apnea    Tobacco Use: ~ 2ppd  - Cessation encouraged    Vaccines:  Immunization History   Administered Date(s) Administered Comments    COVID-19 VACCINE,MRNA(MODERNA)(PF) 02/25/2020      03/23/2020    Deferred Date(s) Deferred Comments    COVID-19 VACC,MRNA,(PFIZER)(PF) 01/24/2020     Influenza Vaccine Quad(IM)6 MO-Adult(PF) 04/08/2020      03/20/2021      02/08/2022        There are no diagnoses linked to this encounter.      Plan of care was discussed with the patient who acknowledged understanding and is in agreement.    Patient will return to clinic in 3 or sooner if needed.    This patient was seen and discussed with attending physician, Dr. Hoyle Barr who agrees with the assessment and plan above.     YN:WGNFA Adam Keith, Dierdre Harness, MD    Gaston Islam, DO  Pulmonary and Critical Care Fellow    HISTORY:     History of Present Illness:  [name] is a 42 y.o. male with a history of the above whom we are seeing in consultation requested by Dierdre Harness for evaluation of DOE.     Patient developed DOE after his cancer diagnosis and chemotherapy. Trouble breathing since MRSA infection. Not SOB, more DOE. Takes shallower breaths due to pain in his right side. It takes him longer to recover. 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 He could do 6 flights in full gear without stopping. Now he can't do a flight of stairs without stopping.    Currently smokes 2ppd and has done so for 10-20 years. Not interested in quitting for everyone else's sake.    Hearing loss, wife assists with meds and history.    Discussed therapies and implications of smoking. Recommended an inhaler as well as close follow-up for progression.     Interval 05/24/21:  - Admitted 03/06/21 for MRSA ONA after flu ICU stay with intubation.  - Using inhaler intermittently due to taste and powdery leftovers.   - Went a week without Anoro, and he felt more wheezy and SOB even between rooms. Wheezing was a big concern even at night.   - Not intersted in smoking cessation at this time, it feels like a nice thing for him when he's hurting and down in his mood.   - Discussed airway clearance    Interval History 04/11/22:  - Recent ED visit for cough, SOB, R sided chest pain. CTAPE neg.  - No dyspnea on exertion. Can maintain his breathing while walking at a slow pace. Generally fatigued, muscle aches.  - Recently discontinued oxycodone, on buprenorphine (has an appoint on Thurs for his chronic pain)  - Sometimes feels like he's drowning when he's lying down, usually improves with coughing phlegm up; uses 2 pillows beneath his head (always has used 2), denies PND. Currently no BLE swelling (worst at nighttime)  - Intermittently productive cough (100x daily), usually clear phlegm   - Wheezing and chest congestion (very frequent).   - Inhaler use: albuterol inhalers 1-2 times day.   - No nighttime awakenings.     Tobacco history  - 1.5 ppd x 15 years, 2 ppd x  10 years, currently at 1ppd (~42 pack years)  - No other illicit drugs, vaping  - Alcohol: drinks 3-4 times/year  - AFFF firefighting foam  - Occupation as a Licensed conveyancer; part of heavy rescue team (fires, Printmaker). 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    Septic shock due to Staphylococcus aureus (CMS-HCC) 01/28/2020    Tear of medial meniscus of knee 10/25/2016    Testicular pain, left 01/21/2022     Past Surgical History:   Procedure Laterality Date    BONE MARROW BIOPSY & ASPIRATION  01/21/2020         CHG [place], CHEST,REAL TIME  11/20/2020    Procedure: ULTRASOUND, CHEST, REAL TIME WITH IMAGE DOCUMENTATION;  Surgeon: Jerelyn Charles, MD;  Location: [facility] PROCED[facility] LAB [facility];  Service: Pulmonary    IR INSERT PORT AGE GREATER THAN 5 YRS  03/10/2020    IR INSERT PORT AGE GREATER THAN 5 YRS 03/10/2020 [name], MD IMG VIR H&V [facility]    PR BRONCHOSCOPY,DIAGNOSTIC W LAVAGE Bilateral 07/20/2020    Procedure: BRONCHOSCOPY, RIGID OR FLEXIBLE, INCLUDE FLUOROSCOPIC GUIDANCE WHEN PERFORMED; W/BRONCHIAL ALVEOLAR LAVAGE WITH MODERATE SEDATION;  Surgeon: Dellis Filbert, MD;  Location: [facility]ON[facility] PROCEDURE LAB [facility];  Service: Pulmonary       Other History:  The social history and family history were personally reviewed  and updated in the patient's electronic medical record.    Family History   Problem Relation Age of Onset    Cancer Mother     No Known Problems Father     No Known Problems Sister     No Known Problems Brother     No Known Problems Maternal Aunt     No Known Problems Maternal Uncle     No Known Problems Paternal Aunt     No Known Problems Paternal Uncle     No Known Problems Maternal Grandmother     Cancer Maternal Grandfather     No Known Problems Paternal Grandmother     No Known Problems Paternal Grandfather     Melanoma Neg Hx     Basal cell carcinoma Neg Hx     Squamous cell carcinoma Neg Hx      Social History     Socioeconomic History    Marital status: Married   Tobacco Use    Smoking status: Every Day     Current packs/day: 2.00     Types: Cigarettes     Passive exposure: Current    Smokeless tobacco: Former     Types: Catering manager Use: Never used   Substance and Sexual Activity    Alcohol use: Yes     Comment: Rarely    Drug use: Never   Other Topics Concern    Do you use sunscreen? No    Tanning bed use? No    Are you easily burned? Yes    Excessive sun exposure? No    Blistering sunburns? Yes     Social Determinants of Health     Financial Resource Strain: Low Risk  (03/08/2021)    Overall Financial Resource Strain (CARDIA)     Difficulty of Paying Living Expenses: Not very hard   Food Insecurity: No Food Insecurity (03/08/2021)    Hunger Vital Sign     Worried About Running Out of Food in the Last Year: Never true     Ran Out of Food in the Last Year: Never true   Transportation Needs: No Transportation Needs (03/08/2021)    PRAPARE - Therapist, art (Medical): No     Lack of Transportation (Non-Medical): No       Home Medications:  Current Outpatient Medications on File Prior to Visit   Medication Sig Dispense Refill    amLODIPine (NORVASC) 10 MG tablet Take 1 tablet (10 mg total) by mouth daily. 30 tablet 0    apixaban (ELIQUIS) 5 mg Tab Take 1 tablet (5 mg total) by mouth two (2) times a day. 60 tablet 6    arm brace (WRIST BRACE) Misc 1 Piece by Miscellaneous route as needed. left ulnar wrist brace 1 each 0    [EXPIRED] buprenorphine (BUTRANS) 10 mcg/hour PTWK transdermal patch Place 1 patch on the skin every seven (7) days for 7 days, THEN 2 patches every seven (7) days for 7 days. 4 patch 0    buprenorphine 20 mcg/hour PTWK transdermal patch Place 1 patch on the skin every seven (7) days for 28 days. 4 patch 0    carvediloL (COREG) 12.5 MG tablet Take 1 tablet (12.5 mg total) by mouth Two (2) times a day. 180 tablet 1    docusate sodium (COLACE) 100 MG capsule Take 1 capsule (100 mg total) by mouth daily.      doxycycline (VIBRA-TABS) 100 MG tablet  Take 1 tablet (100 mg total) by mouth Two (2) times a day. 180 tablet 3    gabapentin (NEURONTIN) 300 MG capsule Take 2 capsules (600mg ) by mouth in the morning and 3 capsules (900mg ) at bedtime. May cause drowsiness. 150 capsule 5    hydroCHLOROthiazide (HYDRODIURIL) 12.5 MG tablet Take 1 tablet (12.5 mg total) by mouth daily. 30 tablet 11    mirtazapine (REMERON) 15 MG tablet TAKE 1 TABLET BY MOUTH EVERY DAY AT NIGHT 90 tablet 4    nortriptyline (PAMELOR) 50 MG capsule TAKE 2 CAPSULES BY MOUTH NIGHTLY. 60 capsule 11    omeprazole (PRILOSEC) 40 MG capsule Take 1 capsule (40 mg total) by mouth every morning.      PONATinib (ICLUSIG) 30 mg tablet Take 1 tablet (30 mg total) by mouth daily. Swallow tablets whole. Do not crush, break, cut or chew tablets. 30 tablet 5    spironolactone (ALDACTONE) 25 MG tablet Take 1 tablet (25 mg total) by mouth daily. 90 tablet 3    sulfamethoxazole-trimethoprim (BACTRIM DS) 800-160 mg per tablet TAKE 1 TABLET BY MOUTH 2 TIMES A DAY ON SATURDAY, SUNDAY. FOR PROPHYLAXIS WHILE ON CHEMO. 48 tablet 3    umeclidinium-vilanteroL (ANORO ELLIPTA) 62.5-25 mcg/actuation inhaler Inhale 1 puff daily. 60 each 11    valACYclovir (VALTREX) 500 MG tablet Take 1 tablet (500 mg total) by mouth daily. 90 tablet 11     No current facility-administered medications on file prior to visit.       Allergies:  Allergies as of 04/11/2022 - Reviewed 04/11/2022   Allergen Reaction Noted    Bupropion hcl Other (See Comments) 12/23/2019    Cefepime Rash 11/04/2020    Ceftaroline fosamil Rash and Other (See Comments) 03/03/2020    Dapsone Other (See Comments) and Anaphylaxis 02/29/2020    Onion Anaphylaxis 01/26/2020    Vancomycin analogues Other (See Comments) 03/16/2020    Bismuth subsalicylate Nausea And Vomiting 08/07/2013    Privigen [immun glob g(igg)-pro-iga 0-50] Other (See Comments) 03/26/2021    Furosemide Other (See Comments) 04/13/2020    Gammagard  10/29/2021       Review of Systems:  A comprehensive review of systems was completed and negative except as noted in HPI.    PHYSICAL EXAM:   BP 144/98  - Pulse 87  - Temp 36.8 ??C (98.2 ??F)  - Wt (!) 134.9 kg (297 lb 6.4 oz)  - SpO2 92%  - BMI 38.17 kg/m??   GEN: NAD, sitting in chair, friendly  EYES: EOMI, sclera anicteric  ENT: Trachea midline, MMM  CV: RRR, no murmurs appreciated  PULM: CTAB, no rhonchi, rales or wheezing  ABD: soft, non-tender, non-distended  EXT: No edema  NEURO: Grossly Non-focal, moving all extremities normally  PSYCH: A+Ox3, appropriate  MSK: no obvious joint deformities of b/l hands      LABORATORY and RADIOLOGY DATA:     Pulmonary Function Tests/Interpretation:    01/25/21 - My interp: moderate obstructive lung disease without significant bronchodilator response. Severely reduced DLCO.       Pertinent Laboratory Data:  Personally reviewed in EMR    Pertinent Imaging Data:  Personally reviewed in EMR

## 2022-04-12 MED ORDER — ALBUTEROL SULFATE HFA 90 MCG/ACTUATION AEROSOL INHALER
Freq: Four times a day (QID) | RESPIRATORY_TRACT | 0 refills | 0 days | Status: CP | PRN
Start: 2022-04-12 — End: 2023-04-12

## 2022-04-12 MED ORDER — STIOLTO RESPIMAT 2.5 MCG-2.5 MCG/ACTUATION SOLUTION FOR INHALATION
Freq: Every day | RESPIRATORY_TRACT | 3 refills | 14 days | Status: CP
Start: 2022-04-12 — End: ?

## 2022-04-12 NOTE — Unmapped (Signed)
I saw and evaluated the patient, participating in the key portions of the service.  I reviewed the resident???s note.  I agree with the resident???s findings and plan. Guido Sander, MD

## 2022-04-12 NOTE — Unmapped (Signed)
[facility] Clinical Assessment & Refill Coordination Note    Adam Keith, DOB: 15-Aug-1980  Phone: [phone] (home)     All above HIPAA information was verified with patient's family member, wife.     Was a Nurse, learning disability used for this call? No    Specialty Medication(s):   Hematology/Oncology: Iclusig     Current Outpatient Medications   Medication Sig Dispense Refill    albuterol HFA 90 mcg/actuation inhaler Inhale 2 puffs every six (6) hours as needed for wheezing. 18 g 0    amLODIPine (NORVASC) 10 MG tablet Take 1 tablet (10 mg total) by mouth daily. 30 tablet 0    apixaban (ELIQUIS) 5 mg Tab Take 1 tablet (5 mg total) by mouth two (2) times a day. 60 tablet 6    arm brace (WRIST BRACE) Misc 1 Piece by Miscellaneous route as needed. left ulnar wrist brace 1 each 0    buprenorphine 20 mcg/hour PTWK transdermal patch Place 1 patch on the skin every seven (7) days for 28 days. 4 patch 0    carvediloL (COREG) 12.5 MG tablet Take 1 tablet (12.5 mg total) by mouth Two (2) times a day. 180 tablet 1    docusate sodium (COLACE) 100 MG capsule Take 1 capsule (100 mg total) by mouth daily.      doxycycline (VIBRA-TABS) 100 MG tablet Take 1 tablet (100 mg total) by mouth Two (2) times a day. 180 tablet 3    gabapentin (NEURONTIN) 300 MG capsule Take 2 capsules (600mg ) by mouth in the morning and 3 capsules (900mg ) at bedtime. May cause drowsiness. 150 capsule 5    hydroCHLOROthiazide (HYDRODIURIL) 12.5 MG tablet Take 1 tablet (12.5 mg total) by mouth daily. 30 tablet 11    mirtazapine (REMERON) 15 MG tablet TAKE 1 TABLET BY MOUTH EVERY DAY AT NIGHT 90 tablet 4    nortriptyline (PAMELOR) 50 MG capsule TAKE 2 CAPSULES BY MOUTH NIGHTLY. 60 capsule 11    omeprazole (PRILOSEC) 40 MG capsule Take 1 capsule (40 mg total) by mouth every morning.      PONATinib (ICLUSIG) 30 mg tablet Take 1 tablet (30 mg total) by mouth daily. Swallow tablets whole. Do not crush, break, cut or chew tablets. 30 tablet 5    spironolactone (ALDACTONE) 25 MG tablet Take 1 tablet (25 mg total) by mouth daily. 90 tablet 3    sulfamethoxazole-trimethoprim (BACTRIM DS) 800-160 mg per tablet TAKE 1 TABLET BY MOUTH 2 TIMES A DAY ON SATURDAY, SUNDAY. FOR PROPHYLAXIS WHILE ON CHEMO. 48 tablet 3    tiotropium-olodaterol (STIOLTO RESPIMAT) 2.5-2.5 mcg/actuation Mist Inhale 2 puffs daily. 4 g 3    valACYclovir (VALTREX) 500 MG tablet Take 1 tablet (500 mg total) by mouth daily. 90 tablet 11     No current facility-administered medications for this visit.        Changes to medications: [name] reports no changes at this time.    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    Onion Anaphylaxis    Vancomycin Analogues Other (See Comments)     Hearing loss  with Lasix    Other reaction(s): Other (See Comments)    Hearing loss    lasix    Hearing loss      lasix      Hearing loss with Lasix Other reaction(s): Other (See Comments) Hearing loss lasix    Bismuth Subsalicylate Nausea And Vomiting    Privigen [Immun Glob G(Igg)-Pro-Iga 0-50] Other (See Comments)     03/10/2021 VIG infusion stopped as patient developed rigors, tachycardia and HTN during infusion despite pre-medication; 03/12/21 completed IVIG with premedications and slower rate of infusion    Furosemide Other (See Comments)     With Vancomycin caused hearing loss    Other reaction(s): Other (See Comments)    With Vancomycin caused hearing loss      With Vancomycin caused hearing loss Other reaction(s): Other (See Comments) With Vancomycin caused hearing loss    Gammagard        Changes to allergies: No    SPECIALTY MEDICATION ADHERENCE     Iclusig 30 mg: ~7-10 days of medicine on hand Are there any concerns with adherence? No    Adherence counseling provided? Not needed    Patient-Reported Symptoms Tracker for Cancer Patients on Oral Chemotherapy     Oral chemotherapy medication name(s): Iclusig  Dose and frequency: 30 mg once daily  Oral Chemotherapy Start Date:    Baseline? No  Clinic(s) visited: Hematology    Symptom Grouping Question Patient Response   Digestion and Eating Have you felt sick to your stomach? Denies    Had diarrhea? Denies    Constipated? Denies    Not wanting to eat? Denies    Comments      Sleep and Pain Felt very tired even after you rest? Denies    Pain due to cancer medication or cancer? Denies    Comments     Other Side Effects Numbness or tingling in hands and/or feet? Denies    Felt short of breath? Denies    Mouth or throat Sores? Denies    Rash? Denies    Palmar-plantar erythrodysesthesia syndrome?      Rash - acneiform?      Rash - maculo-papular?      How many days over the past month did your cancer medication or cancer keep you from your normal activities?  Write in number of days, 0-30:  0    Other side effects or things you would like to discuss?      Comments?     Adherence  In the last 30 days, on how many days did you miss at least one dose of any of your [drug name]? Write in number of days, 0-30:  0    What reasons are you having trouble taking your medication [pharmacist: check all that apply]? Specify chemotherapy cycle:        No problems identified    Comments:        Comments       Optional Symptom Tracking Comments: NA    CLINICAL MANAGEMENT AND INTERVENTION      Clinical Benefit Assessment:    Do you feel the medicine is effective or helping your condition? Yes    Clinical Benefit counseling provided? Not needed    Acute Infection Status:    Acute infections noted within Epic:  No active infections    Patient reported infection: None    Therapy Appropriateness:    Is therapy appropriate and patient progressing towards therapeutic goals? Yes, therapy is appropriate and should  be continued    DISEASE/MEDICATION-SPECIFIC INFORMATION      N/A    Is the patient receiving adequate infection prevention treatment? Not applicable    Does the patient have adequate nutritional support? Not applicable    PATIENT SPECIFIC NEEDS     Does the patient have any physical, cognitive, or cultural barriers? No    Is the patient high risk? No    Did the patient require a clinical intervention? No    Does the patient require physician intervention or other additional services (i.e., nutrition, smoking cessation, social work)? No    SOCIAL DETERMINANTS OF HEALTH     At the [facility], we have learned that life circumstances - like trouble affording food, housing, utilities, or transportation can affect the health of many of our patients.   That is why we wanted to ask: are you currently experiencing any life circumstances that are negatively impacting your health and/or quality of life? Patient declined to answer    Social Determinants of Health     Financial Resource Strain: Low Risk  (03/08/2021)    Overall Financial Resource Strain (CARDIA)     Difficulty of Paying Living Expenses: Not very hard   Internet Connectivity: Not on file   Food Insecurity: No Food Insecurity (03/08/2021)    Hunger Vital Sign     Worried About Running Out of Food in the Last Year: Never true     Ran Out of Food in the Last Year: Never true   Tobacco Use: High Risk (04/07/2022)    Patient History     Smoking Tobacco Use: Every Day     Smokeless Tobacco Use: Former     Passive Exposure: Current   Housing/Utilities: Low Risk  (03/08/2021)    Housing/Utilities     Within the past 12 months, have you ever stayed: outside, in a car, in a tent, in an overnight shelter, or temporarily in someone else's home (i.e. couch-surfing)?: No     Are you worried about losing your housing?: No     Within the past 12 months, have you been unable to get utilities (heat, electricity) when it was really needed?: No Alcohol Use: Not on file   Transportation Needs: No Transportation Needs (03/08/2021)    PRAPARE - Therapist, art (Medical): No     Lack of Transportation (Non-Medical): No   Substance Use: Not on file   Health Literacy: Not on file   Physical Activity: Not on file   Interpersonal Safety: Not on file   Stress: Not on file   Intimate Partner Violence: Not on file   Depression: Not on file   Social Connections: Not on file     Would you be willing to receive help with any of the needs that you have identified today? Not applicable     SHIPPING     Specialty Medication(s) to be Shipped:   Hematology/Oncology: Iclusig    Other medication(s) to be shipped: No additional medications requested for fill at this time     Changes to insurance: No    Delivery Scheduled: Yes, Expected medication delivery date: 04/20/22 - [place].     Medication will be delivered via UPS to the confirmed prescription address in [facility]    The patient will receive a drug information handout for each medication shipped and additional FDA Medication Guides as required.  Verified that patient has previously received a Conservation officer, historic buildings and a Technical sales engineer  Practices.    The patient or caregiver noted above participated in the development of this care plan and knows that they can request review of or adjustments to the care plan at any time.      All of the patient's questions and concerns have been addressed.    Kermit Balo, [facility]   [facility] Specialty Pharmacist

## 2022-04-14 ENCOUNTER — Telehealth
Admit: 2022-04-14 | Discharge: 2022-04-15 | Payer: MEDICAID | Attending: Student in an Organized Health Care Education/Training Program | Primary: Student in an Organized Health Care Education/Training Program

## 2022-04-14 DIAGNOSIS — G893 Neoplasm related pain (acute) (chronic): Principal | ICD-10-CM

## 2022-04-14 DIAGNOSIS — Z515 Encounter for palliative care: Principal | ICD-10-CM

## 2022-04-14 MED ORDER — OXYCODONE 5 MG TABLET
ORAL_TABLET | ORAL | 0 refills | 3 days | Status: CP | PRN
Start: 2022-04-14 — End: 2022-04-28

## 2022-04-14 MED ORDER — BUPRENORPHINE 20 MCG/HOUR WEEKLY TRANSDERMAL PATCH
MEDICATED_PATCH | TRANSDERMAL | 0 refills | 30 days | Status: CP
Start: 2022-04-14 — End: 2022-05-14
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
## 2022-04-15 DIAGNOSIS — G893 Neoplasm related pain (acute) (chronic): Principal | ICD-10-CM

## 2022-04-15 MED ORDER — OXYCODONE 5 MG TABLET
ORAL_TABLET | ORAL | 0 refills | 3 days | Status: CP | PRN
Start: 2022-04-15 — End: 2022-04-29
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
## 2022-04-18 MED FILL — ICLUSIG 30 MG TABLET: ORAL | 30 days supply | Qty: 30 | Fill #4

## 2022-04-21 ENCOUNTER — Ambulatory Visit
Admit: 2022-04-21 | Discharge: 2022-04-21 | Payer: MEDICAID | Attending: Hematology & Oncology | Primary: Hematology & Oncology

## 2022-04-21 ENCOUNTER — Ambulatory Visit: Admit: 2022-04-21 | Discharge: 2022-04-21 | Payer: MEDICAID

## 2022-04-21 DIAGNOSIS — I82532 Chronic embolism and thrombosis of left popliteal vein: Principal | ICD-10-CM

## 2022-04-21 DIAGNOSIS — C9100 Acute lymphoblastic leukemia not having achieved remission: Principal | ICD-10-CM

## 2022-04-21 DIAGNOSIS — C9101 Acute lymphoblastic leukemia, in remission: Principal | ICD-10-CM

## 2022-04-21 LAB — COMPREHENSIVE METABOLIC PANEL
ALBUMIN: 4.3 g/dL (ref 3.4–5.0)
ALKALINE PHOSPHATASE: 97 U/L (ref 46–116)
ALT (SGPT): 44 U/L (ref 10–49)
ANION GAP: 9 mmol/L (ref 5–14)
AST (SGOT): 20 U/L (ref <=34)
BILIRUBIN TOTAL: 0.4 mg/dL (ref 0.3–1.2)
BLOOD UREA NITROGEN: 15 mg/dL (ref 9–23)
BUN / CREAT RATIO: 9
CALCIUM: 9.3 mg/dL (ref 8.7–10.4)
CHLORIDE: 105 mmol/L (ref 98–107)
CO2: 25.4 mmol/L (ref 20.0–31.0)
CREATININE: 1.76 mg/dL — ABNORMAL HIGH
EGFR CKD-EPI (2021) MALE: 49 mL/min/{1.73_m2} — ABNORMAL LOW (ref >=60)
GLUCOSE RANDOM: 128 mg/dL — ABNORMAL HIGH (ref 70–99)
POTASSIUM: 3.8 mmol/L (ref 3.4–4.8)
PROTEIN TOTAL: 6.7 g/dL (ref 5.7–8.2)
SODIUM: 139 mmol/L (ref 135–145)

## 2022-04-21 LAB — CBC W/ AUTO DIFF
BASOPHILS ABSOLUTE COUNT: 0.1 10*9/L (ref 0.0–0.1)
BASOPHILS RELATIVE PERCENT: 1.2 %
EOSINOPHILS ABSOLUTE COUNT: 0.3 10*9/L (ref 0.0–0.5)
EOSINOPHILS RELATIVE PERCENT: 2.9 %
HEMATOCRIT: 50.3 % — ABNORMAL HIGH (ref 39.0–48.0)
HEMOGLOBIN: 17.4 g/dL — ABNORMAL HIGH (ref 12.9–16.5)
LYMPHOCYTES ABSOLUTE COUNT: 2.2 10*9/L (ref 1.1–3.6)
LYMPHOCYTES RELATIVE PERCENT: 20.2 %
MEAN CORPUSCULAR HEMOGLOBIN CONC: 34.6 g/dL (ref 32.0–36.0)
MEAN CORPUSCULAR HEMOGLOBIN: 30 pg (ref 25.9–32.4)
MEAN CORPUSCULAR VOLUME: 86.8 fL (ref 77.6–95.7)
MEAN PLATELET VOLUME: 9.1 fL (ref 6.8–10.7)
MONOCYTES ABSOLUTE COUNT: 0.5 10*9/L (ref 0.3–0.8)
MONOCYTES RELATIVE PERCENT: 4.3 %
NEUTROPHILS ABSOLUTE COUNT: 7.9 10*9/L — ABNORMAL HIGH (ref 1.8–7.8)
NEUTROPHILS RELATIVE PERCENT: 71.4 %
NUCLEATED RED BLOOD CELLS: 0 /100{WBCs} (ref <=4)
PLATELET COUNT: 204 10*9/L (ref 150–450)
RED BLOOD CELL COUNT: 5.8 10*12/L — ABNORMAL HIGH (ref 4.26–5.60)
RED CELL DISTRIBUTION WIDTH: 15.4 % — ABNORMAL HIGH (ref 12.2–15.2)
WBC ADJUSTED: 11.1 10*9/L (ref 3.6–11.2)

## 2022-04-22 DIAGNOSIS — R600 Localized edema: Principal | ICD-10-CM
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
## 2022-04-23 DIAGNOSIS — A4902 Methicillin resistant Staphylococcus aureus infection, unspecified site: Principal | ICD-10-CM

## 2022-04-23 MED ORDER — DOXYCYCLINE HYCLATE 100 MG TABLET
ORAL_TABLET | Freq: Two times a day (BID) | ORAL | 3 refills | 90 days | Status: CP
Start: 2022-04-23 — End: ?

## 2022-04-23 NOTE — Unmapped (Signed)
Doxycycline refilled.

## 2022-04-25 NOTE — Unmapped (Signed)
Hematology Consult Note    Referring Physician :  [name], MD    Primary Care Physician:   Dierdre Harness, MD    Reason for Consult:  Lower extremity swelling, prior proximal LLE DVT    Assessment/Recommendations:   Adam Keith is a 42 y.o. year old white male who we are seeing in consultation at the request of Dr. Senaida Ores for further  evaluation of LE swelling, and history of DVT.    1.  DVT LLE (proximal):  September 2023 (see PVL report below). Risk factors include Ponatinib therapy (after re-escalation of dose), but no immobilization, injury, surgery, hospitalization in the preceding 3 months. Small PE on CTA 02/04/2022, age indeterminate (see report below)    2. Bilateral LE edema: Began when patient returned to work in August 2023. Worsened by upright posture, usually relieved by reclining. Works in a heavy physical capacity.     Follow up PVLs in 11/23 showed no residual LLE obstruction; bilateral dopplers did not suggest IVC  obstruction above inguinal ligaments. This, and the fact that edema is bilateral are less likely indicators of relationship to previous DVT. However, besides residual obstruction, venous valvular incompetence could be present.    Echo not suggestive of R heart failure    3. 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 Course documented in Dr. Senaida Ores clinic notes. Has been maintained on Ponatinib since 10/22. Dose had been reduced from 30 mg to 15 mg but due to an increase in BCR-ABL      Plans:  1.  Will evaluate LLE for venous insufficiency (bilateral study not possible); order placed. CTV of abdomen/pelvis is probably the best way to rule out IVC obstruction, although this seems unlikely from the PVLs as discussed above.    2. Patient declines to wear below knee compression stockings    3. Given the fact that Ponatinib was the only clear precipitating risk factor for VTE, and because he continues to be dependent on Ponatinib (which is well recognized to be associated with a high risk of VTE and ATE)  for control of his leukemia, would recommend continue Eliquis at current dose of 5 mg bid.     4. RTC prn      History of Present Illness:   Adam Keith is a 42 y.o. year old white male who we are seeing in consultation at the request of Dr. Senaida Ores for further  evaluation of LE swelling, and history of DVT.    The patient had a complicated history related to the diagnosis of Ph+ B-ALL in 2021. This included several months of ICU due to sepsis, acute renal failure and respiratory distress requiring dialysis and intubation. No prior history of VTE or arterial thrombosis until he developed a L popliteal DVT in September 2023, likely accompanied by incidentally discovered small burden PE.    FH is negative for VTE.    Tolerating Eliquis very well; denies no epistaxis, gum bleeding, mouth bleeding, hemoptysis, hematemesis, hematochezia, melena, or hematuria.      Medical History:  Patient Active Problem List   Diagnosis   ??? Tobacco use disorder   ??? 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 Red blood cell antibody positive   ??? 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 PTSD (post-traumatic stress disorder)   ??? Anxiety   ??? Insomnia   ??? Immunocompromised (CMS-HCC)   ??? Hypogammaglobulinemia (CMS-HCC)   ??? ALL (acute lymphoblastic leukemia) (CMS-HCC)   ???  Chronic pain   ??? COPD (chronic obstructive pulmonary disease) (CMS-HCC)   ??? Acute lymphoblastic leukemia in remission (CMS-HCC)   ??? Incarcerated umbilical hernia   ??? Late effect of fracture of multiple bones   ??? Low back pain   ??? Migraine   ??? Obesity   ??? Pain in joint involving ankle and foot   ??? Panic attacks   ??? CKD (chronic kidney disease)   ??? Hypersensitivity   ??? Bilateral lower extremity edema   ??? Popliteal DVT (deep venous thrombosis) (CMS-HCC)   ??? Hypertension   ??? Acute hypoxic respiratory failure (CMS-HCC)   ??? Subacute pulmonary embolism (CMS-HCC)   ??? Obstructive sleep apnea         Past Surgical History:   Procedure Laterality Date   ??? BONE MARROW BIOPSY & ASPIRATION  01/21/2020        ??? CHG [place], CHEST,REAL TIME  11/20/2020    Procedure: ULTRASOUND, CHEST, REAL TIME WITH IMAGE DOCUMENTATION;  Surgeon: Jerelyn Charles, MD;  Location: [facility];  Service: Pulmonary   ??? IR INSERT PORT AGE GREATER THAN 5 YRS  03/10/2020    IR INSERT PORT AGE GREATER THAN 5 YRS 03/10/2020 [name], MD IMG VIR H&V [facility]   ??? PR BRONCHOSCOPY,DIAGNOSTIC W LAVAGE Bilateral 07/20/2020    Procedure: BRONCHOSCOPY, RIGID OR FLEXIBLE, INCLUDE FLUOROSCOPIC GUIDANCE WHEN PERFORMED; W/BRONCHIAL ALVEOLAR LAVAGE WITH MODERATE SEDATION;  Surgeon: Dellis Filbert, MD;  Location: [facility];  Service: Pulmonary         Medications:   Current Outpatient Medications   Medication Sig Dispense Refill   ??? albuterol HFA 90 mcg/actuation inhaler Inhale 2 puffs every six (6) hours as needed for wheezing. 18 g 0   ??? amLODIPine (NORVASC) 10 MG tablet Take 1 tablet (10 mg total) by mouth daily. 30 tablet 0   ??? apixaban (ELIQUIS) 5 mg Tab Take 1 tablet (5 mg total) by mouth two (2) times a day. 60 tablet 6   ??? arm brace (WRIST BRACE) Misc 1 Piece by Miscellaneous route as needed. left ulnar wrist brace 1 each 0   ??? carvediloL (COREG) 12.5 MG tablet Take 1 tablet (12.5 mg total) by mouth Two (2) times a day. 180 tablet 1   ??? cyclobenzaprine (FLEXERIL) 10 MG tablet Take 1 tablet (10 mg total) by mouth Three (3) times a day as needed for muscle spasms.     ??? docusate sodium (COLACE) 100 MG capsule Take 1 capsule (100 mg total) by mouth daily.     ??? gabapentin (NEURONTIN) 300 MG capsule Take 2 capsules (600mg ) by mouth in the morning and 3 capsules (900mg ) at bedtime. May cause drowsiness. 150 capsule 5   ??? hydroCHLOROthiazide (HYDRODIURIL) 12.5 MG tablet Take 1 tablet (12.5 mg total) by mouth daily. 30 tablet 11   ??? mirtazapine (REMERON) 15 MG tablet TAKE 1 TABLET BY MOUTH EVERY DAY AT NIGHT 90 tablet 4   ??? nortriptyline (PAMELOR) 50 MG capsule TAKE 2 CAPSULES BY MOUTH NIGHTLY. 60 capsule 11   ??? omeprazole (PRILOSEC) 40 MG capsule Take 1 capsule (40 mg total) by mouth every morning.     ??? ondansetron (ZOFRAN-ODT) 4 MG disintegrating tablet Take 1 tablet (4 mg total) by mouth every eight (8) hours as needed for nausea.     ??? oxyCODONE (ROXICODONE) 5 MG immediate release tablet Take 1-2 tablets (5-10 mg total) by mouth every four (4) hours as needed for pain for up  to 14 days. 30 tablet 0   ??? PONATinib (ICLUSIG) 30 mg tablet Take 1 tablet (30 mg total) by mouth daily. Swallow tablets whole. Do not crush, break, cut or chew tablets. 30 tablet 5   ??? spironolactone (ALDACTONE) 25 MG tablet Take 1 tablet (25 mg total) by mouth daily. 90 tablet 3   ??? sulfamethoxazole-trimethoprim (BACTRIM DS) 800-160 mg per tablet TAKE 1 TABLET BY MOUTH 2 TIMES A DAY ON SATURDAY, SUNDAY. FOR PROPHYLAXIS WHILE ON CHEMO. 48 tablet 3   ??? tiotropium-olodaterol (STIOLTO RESPIMAT) 2.5-2.5 mcg/actuation Mist Inhale 2 puffs daily. 4 g 3   ??? valACYclovir (VALTREX) 500 MG tablet Take 1 tablet (500 mg total) by mouth daily. 90 tablet 11   ??? doxycycline (VIBRA-TABS) 100 MG tablet Take 1 tablet (100 mg total) by mouth two (2) times a day. 180 tablet 3     No current facility-administered medications for this visit.         Allergies:  Bupropion hcl, Cefepime, Ceftaroline fosamil, Dapsone, Onion, Vancomycin analogues, Bismuth subsalicylate, Privigen [immun glob g(igg)-pro-iga 0-50], Furosemide, and Gammagard      Social History:  Social History     Socioeconomic History   ??? Marital status: Married     Spouse name: None   ??? Number of children: None   ??? Years of education: None   ??? Highest education level: None   Tobacco Use   ??? Smoking status: Every Day     Current packs/day: 2.00     Types: Cigarettes     Passive exposure: Current   ??? Smokeless tobacco: Former     Types: Financial planner   ??? Vaping Use: Never used Substance and Sexual Activity   ??? Alcohol use: Yes     Comment: Rarely   ??? Drug use: Never   Other Topics Concern   ??? Do you use sunscreen? No   ??? Tanning bed use? No   ??? Are you easily burned? Yes   ??? Excessive sun exposure? No   ??? Blistering sunburns? Yes     Social Determinants of Health     Financial Resource Strain: Low Risk  (03/08/2021)    Overall Financial Resource Strain (CARDIA)    ??? Difficulty of Paying Living Expenses: Not very hard   Food Insecurity: No Food Insecurity (03/08/2021)    Hunger Vital Sign    ??? Worried About Running Out of Food in the Last Year: Never true    ??? Ran Out of Food in the Last Year: Never true   Transportation Needs: No Transportation Needs (03/08/2021)    PRAPARE - Transportation    ??? Lack of Transportation (Medical): No    ??? Lack of Transportation (Non-Medical): No       Family History:  family history includes Cancer in his maternal grandfather and mother; No Known Problems in his brother, father, maternal aunt, maternal grandmother, maternal uncle, paternal aunt, paternal grandfather, paternal grandmother, paternal uncle, and sister.   He indicated that the status of his mother is unknown. He indicated that the status of his father is unknown. He indicated that the status of his sister is unknown. He indicated that the status of his brother is unknown. He indicated that the status of his maternal grandmother is unknown. He indicated that the status of his maternal grandfather is unknown. He indicated that the status of his paternal grandmother is unknown. He indicated that the status of his paternal grandfather is unknown. He indicated that  the status of his maternal aunt is unknown. He indicated that the status of his maternal uncle is unknown. He indicated that the status of his paternal aunt is unknown. He indicated that the status of his paternal uncle is unknown. He indicated that the status of his neg hx is unknown.    Negative for VTE    Review of Systems:  As per HPI, otherwise negative x 10 systems.    Objective :  Vitals:    04/21/22 0854   BP: (!) 134/100   BP Site: L Arm   BP Position: Sitting   Pulse: 88   Temp: 36.9 ??C (98.4 ??F)   TempSrc: Temporal   SpO2: 97%   Weight: 135.4 kg (298 lb 9.6 oz)   Height: 188 cm (6' 2.02)       Physical Exam:  GEN: well-appearing man in no acute distress  HENT: oropharynx is without erythema or exudate, mucous membranes are moist, no oral lesions, dentition is normal, no nasal discharge, nasal mucosae are pink  EYES: pupils equal, round, and reactive to light, no scleral injection or icterus, conjunctivae are pink with no discharge  NECK: supple, without enlargements  LYMPH NODES: no enlarged lymph nodes palpated in the submandibular,cervical, or clavicular or submandibular, cervical, clavicular, axillary, epitrochlear, inguinal, or popliteal areas  CV: heart is regular rate and rhythm, no murmurs or rubs; radial pulses 2+ bilaterally  PULM: clear to auscultation bilaterally without wheezes or rales; breathing is non-labored  ABD: soft, non-tender, non-distended, normal bowel sounds, no hepatosplenomegaly  EXTREMITIES: no lower extremity edema  MSK: no joint effusions or muscle tenderness  SKIN: Quarter sized ulcer R ant calf (traumatic), no edema evident, no hemosiderosis, no varicosities   NEURO: gait normal, sensation is grossly intact, motor function is grossly intact      Imaging:    PVL VENOUS DUPLEX LOWER EXTREMITY BILATERAL  Patient Demographics Pt. Name: Adam Keith Location: PVL Outpatient Lab  MRN:      [id number]            Sex:      M  DOB:      03-08-1981            Age:      51 years     Study Information  Authorizing         [phone] Ursula Alert     Performed Time       12/09/2021 1:53:20  Provider Name       [facility]                                  PM  Ordering Physician  Lenon Ahmadi Patient Location     [facility]  Accession Number    [id number] UN         Technologist         Lauren Napolitano  Diagnosis: Assisting                                            Technologist  Ordered Reason For Exam: new lower leg swelling R>L  Indication: Swelling  Risk Factors: Chemotherapy and Cancer (ALL).  Protocol  The major deep veins from the inguinal ligament to the ankle are assessed for bilaterally for compressibility and color and spectral Doppler flow characteristics.  The assessed veins include bilateral common femoral vein, femoral vein in the thigh, popliteal vein, and intramuscular calf veins. The iliac vein is assessed indirectly using Doppler waveform analysis. The great saphenous vein is assessed for compressibility at the saphenofemoral junction, and the small saphenous vein assessed for compressibility behind the knee.     Right Duplex Findings  All veins visualized appear fully compressible. Doppler flow signals demonstrate normal spontaneity, phasicity, and augmentation.     Left Duplex Findings  The left popliteal vein is partially compressible and appears spongy with compression and softly echogenic.  All other veins visualized appear fully compressible and demonstrate appropriate Doppler characteristics.     Right Technical Summary  No evidence of deep venous obstruction in the lower extremity. No indirect evidence of obstruction proximal to the inguinal ligament.     Left Technical Summary  Evidence of acute obstruction in the left popliteal vein.  All other veins visualized appear fully compressible and demonstrate appropriate Doppler characteristics.     Final Interpretation  Right  There is no evidence of DVT in the lower extremity. There is no evidence of obstruction proximal to the inguinal ligament or in the common femoral vein.  Left  There is evidence of acute DVT in the proximal veins.     Electronically signed by [id number] Jodell Cipro MD on 12/09/2021 at 2:18:58 PM.      PVL VENOUS DUPLEX LOWER EXTREMITY BILATERAL  Patient Demographics Pt. Name: Adam Keith Reasons Location: PVL Inpatient Lab  MRN:      [id number]            Sex:      M  DOB:      [birth year]            Age:      53 years     Study Information  Authorizing         [id number] CAMILLE T      Performed Time       02/08/2022  Provider Name       BEATON                                     10:05:20 AM  Ordering Physician  Michiel Sites       Patient Location     [facility]  Accession Number    [id number] UN         Technologist         Tanja Port RVT  Diagnosis:                                Assisting                                            Technologist  Ordered Reason For Exam: pulmonary embolism  Indication: SOB  Risk Factors: Chemotherapy, confirmed PE and Cancer (ALL).  Anticoagulation: (Heparin).  Protocol  The major deep veins from the inguinal ligament to the ankle are assessed for bilaterally for compressibility and color and spectral Doppler flow characteristics. The assessed veins include bilateral common femoral vein, femoral vein in the thigh, popliteal vein, and intramuscular calf veins. The iliac vein is assessed indirectly using Doppler waveform analysis. The great saphenous vein is assessed  for compressibility at the saphenofemoral junction, and the small saphenous vein assessed for compressibility behind the knee.     Right Duplex Findings  All veins visualized appear fully compressible. Doppler flow signals demonstrate normal spontaneity, phasicity, and augmentation.     Left Duplex Findings  All veins visualized appear fully compressible. Doppler flow signals demonstrate normal spontaneity, phasicity, and augmentation.     Right Technical Summary  No evidence of deep venous obstruction in the lower extremity. No indirect evidence of obstruction proximal to the inguinal ligament.     Left Technical Summary  No evidence of deep venous obstruction in the lower extremity. No indirect evidence of obstruction proximal to the inguinal ligament.     Final Interpretation  Right  There is no evidence of DVT in the lower extremity. There is no evidence of obstruction proximal to the inguinal ligament or in the common femoral vein.  Left  There is no evidence of DVT in the lower extremity. There is no evidence of obstruction proximal to the inguinal ligament or in the common femoral vein.     Electronically signed by [id number] Jodell Cipro MD on 02/08/2022 at 11:13:33 AM      EXAM: 02/04/2022  CT ANGIOGRAPHY CHEST WITH CONTRAST     TECHNIQUE:   Multidetector CT imaging of the chest was performed using the   standard protocol during bolus administration of intravenous   contrast. Multiplanar CT image reconstructions and MIPs were   obtained to evaluate the vascular anatomy.     RADIATION DOSE REDUCTION: This exam was performed according to the   departmental dose-optimization program which includes automated   exposure control, adjustment of the mA and/or kV according to   patient size and/or use of iterative reconstruction technique.     CONTRAST: ??75mL OMNIPAQUE IOHEXOL 350 MG/ML SOLN     COMPARISON: ??Radiograph earlier today. Report from chest CT   05/17/2021 performed at an outside institution. Please note patient   has had multiple prior outside exams that are not available for   review.     FINDINGS:   Cardiovascular: Small nonocclusive pulmonary embolus in the upper   lobe segmental branch. Thromboembolic burden is small. No other   pulmonary arterial filling defects. The heart is normal in size.   Extensive left ventricular myocardial calcifications. No pericardial   effusion.     Mediastinum/Nodes: Right paratracheal node measures 11 mm, series 4   image 38. There is an upper paratracheal node measuring 9 mm series   4 image 23. No enlarged hilar nodes. No esophageal wall thickening.   There is no visible thyroid nodule     Lungs/Pleura: There is volume loss in the right hemithorax with   opacity in the right upper lobe extending to the apex. Suspected   chronic areas of bronchiolectasis. Subsegmental opacities in the   right middle lobe. There is background emphysema with bronchial   thickening. No pleural effusion.     Upper Abdomen: Suspected hepatic steatosis. No acute upper abdominal   findings. There is no upper abdominal adenopathy.     Musculoskeletal: There are no acute or suspicious osseous   abnormalities.     Review of the MIP images confirms the above findings.     IMPRESSION:   1. Small nonocclusive pulmonary embolus in the right upper lobe   segmental branch. Thromboembolic burden is small.   2. Volume loss in the right hemithorax with opacity in the right   upper lobe extending to the  apex. This has been reported on prior   outside imaging which is not available for review. Stability/acuity   cannot be determined.   3. Few prominent/borderline mediastinal nodes measuring up to 11 mm.   Comparison with prior imaging is recommended to assess for   stability, given leukemia history.   4. Left ventricular myocardial calcifications, chronic.     Emphysema (ICD10-J43.9).

## 2022-04-30 NOTE — Unmapped (Signed)
Summary Statement:    This is a 42 year old with a history of acute leukemia, currently being treated with panatinib. The participant reports that since the last call, there has been an unplanned ED visits. The participant reports that since the last call, there were not discussions with the clinical team about treatment planning or about the overall disease course. He has been meeting his goals since the last call, connecting with Gabby at [employer] and completing his [employer]Score survey.      Coaching Call Review 04/25/2022  Adam Keith was tired and emotionally drained after a busy weekend at work.  He is feeling frustrated about his finances and feels trapped by ruined credit and living paycheck to paycheck.  The only way he sees out is to either build a new woodshop, get a sawmill, or a get a tractor.  To move towards those goals he plans to apply for a disabled veteran loan through the [place].  He was not interested in setting goals to make the deadline at the end of the month.      Weekly Report for week starting 04/26/2022  Please click below to view a more detailed report on our HealthScore website:  weekly report 04/22/2022 * Please hold the shift key when clicking the hyperlink to access your patient???s report   The following symptoms have been flagged with an alert this week 04/26/2022:  No alerts    New SMART Goals:     Movement:   Domain discussed: no    Mood:   Domain discussed: yes   No goal    Meaning:   Domain discussed: Yes  No Goals    [name] declined all goals this week     Coaching Call Detailed Notes 04/25/22       Symptoms:   Adam Keith continues to report back pain that is constantly between a 6 and 8.  His shin is still painful where he hit it and it opens each day when his legs swell.  We talked about other types of bandages and he declined wanting to try anything different.      Movement:   Movement was not discussed this call.     In the last week [name] completed 0 bouts of intentional exercise. He has not been wearing his FitBit.        Mood:   Mood was discussed this call.    In the last week, Adam Keith reports no new stressors.  He seemed frustrated by his finances, but wasn???t open to brainstorming or creating solutions.  He feels like his path is the only way out.  He is interested in completing a VA loan for disables veterans to support building a new woodshop that would cost about $30,000.  The grant cycle is monthly, and he can apply monthly, but he wasn???t interested in setting a goal to apply this month.  He is approaching the anniversary of his son???s death and spent some time talking about how he just runs out of happiness and isn???t sure what makes it fill back up.  He said that he was ???out of happy juice??? as we were talking- when I offered to brainstorm ways to fill up he declined.     Meaning:   Meaning and meaning-making were discussed during this call. The most important thing to Elida right now is to ???show up???.  When I asked him what he was interested in showing up to, he said to work, but he  feels like he doesn???t have a choice.  We talked about how frustrating it can be to have a vision for life and not be able to see how to get there or when it will happen.  His faith does not play a role in the day-to-day trust that things will work out, but rather he feels that he just must keep going.  His faith is important for his peace of mind about what will happen when he dies.     Adam Keith also talked today about the frustration he feels with the medical community and feeling like his days are numbered.  I asked him what living looks like in the context of his illness and he said that he can???t.  He was not open to generating ideas about things that could make a difference in his quality of life.     Overall, Adam Keith is resistant to coaching.  He has really creative and good ideas about how to care for himself and cultivate happiness, but isn???t open to setting goals to actualizing those ideas.  He declined all goal setting today.     Next call: January 29, 11 am

## 2022-05-04 DIAGNOSIS — G893 Neoplasm related pain (acute) (chronic): Principal | ICD-10-CM

## 2022-05-04 MED ORDER — BUPRENORPHINE 20 MCG/HOUR WEEKLY TRANSDERMAL PATCH
MEDICATED_PATCH | TRANSDERMAL | 0 refills | 40 days | Status: CP
Start: 2022-05-04 — End: 2022-06-13

## 2022-05-04 MED ORDER — OXYCODONE 5 MG TABLET
ORAL_TABLET | ORAL | 0 refills | 3 days | Status: CP | PRN
Start: 2022-05-04 — End: 2022-05-04

## 2022-05-04 NOTE — Unmapped (Signed)
[name] met with his health coach today and reported that he has had issues with medication management since they last met. Health coach contacted me to let me know that he was almost out of his oxycodone, his pain patch had not been refilled, and he ran out of a yellow pill that he never got refilled (has not been taking in the past week). Reported that he already reached out to his palliative care team to discuss.     I contacted Dr. Genice Rouge and Allegra Lai to update them. Also called Adam Keith to get additional information about the yellow pill. He said that he has the pill bottle somewhere but is unsure if it is the right one. Does describe a yellow capsule and believes it was his 300mg  gabapentin. He said that he does not need it refilled because he has been fine without it the last week. Reports that he has 3, 5mg  oxycodone left. Updated palliative care team.    Owens Shark, BSN, RN, [facility]   She/Her/Hers  Oncology Nurse Navigator - HealthScore Resource Nurse  [facility]  [address], [place], [place] [address]

## 2022-05-04 NOTE — Unmapped (Signed)
Called Mr. Nida to let him know that Dr. Genice Rouge refilled his oxycodone and Butrans at the [facility]. Mr. Fradette asked that the oxycodone be filled at the [facility] [address] in [place] due to supply issues. Mr. Lefrancois wanted the Butrans refill to remain at the [facility] location. Updated palliative care team.     [facility] pharmacy to cancel previous oxycodone script, per Dr. Williemae Natter request.     Called Mr. Schaad to update him that both the prescriptions have been sent to his pharmacies. Mr. Kalmbach confirmed that it was the gabapentin that he was unable to identify earlier. Told him to notify palliative care team if he does want the gabapentin refilled after all.     Owens Shark, BSN, RN, [facility]   She/Her/Hers  Oncology Nurse Navigator - [employer] Resource RN  [facility]  [address], [place], [place] [address]    Center: [phone]  Voicemail: [phone]  [email or web address]

## 2022-05-05 NOTE — Unmapped (Signed)
Prior Authorization submitted May 05, 2022 for Buprenorphine 20 mcg/h patch Qty 6 for 30 days.    Insurance information: [place] [employer] - submitted via Provider Portal      ID: [id number]    Confirmation #: [id number]  Prior Approval #: [id number]    Time spent: 15 min    APPROVED for 365 days. Left VM with pharmacy notifying them of approval. Of note, Butrans patches come in boxes of 4 patches - rx was sent for two boxes (8 patches or 40 days supply). Informed pharmacy in VM that if unable to dispense quantity larger than 30 days supply, ok to dispense 20 days supply (1 box) at a time.    [name], PharmD, CPP  Outpatient Oncology Palliative Care  May 05, 2022 3:39 PM

## 2022-05-07 NOTE — Unmapped (Signed)
Summary Statement:    This is a 42 year old with a history of acute leukemia, currently being treated with panatinib. The participant reports that since the last call, there have been no unplanned ED visits. The participant reports that since the last call, there were not discussions with the clinical team about treatment planning or about the overall disease course. He has not been meeting or setting goals.       Coaching Call Review 05/04/2022  Adam Keith reported a cold on today???s call.  He got sick over the weekend and still feels poorly with body aches, low grade fever and congestion.  He is taking Tyleonol cold and flu and thinks it helps a little.  He is frustrated at how long it takes him to recover and how little he is able to do.     Weekly Report for week starting 05/03/2022  Please click below to view a more detailed report on our [employer] website:  weekly report 05/01/2022 * Please hold the shift key when clicking the hyperlink to access your patient???s report   The following symptoms have been flagged with an alert this week 05/03/2022:  Med Managment, Cold Symptoms    Resource Nurse Note:    05/04/22: Resource RN call: Called to ask additional questions about medication management and symptoms. Has 3 oxy (5mg ) tablets left and believes it is his gabapentin that he has not been taking for the past week. Does have the original pill bottle but is unsure what the yellow pill was. Will update palliative care team. Symptom management plan in place for cold symptoms. Will notify PCP or report to the ED if symptoms worsen.     05/04/22: Resource RN called [facility] pharmacy to cancel previous oxycodone script, per Dr. Williemae Natter request.      05/04/2022: Resource RN called Adam Keith to update him that both the prescriptions have been sent to his pharmacies. Adam Keith confirmed that it was the gabapentin that he was unable to identify earlier. Told him to notify palliative care team if he does want the gabapentin refilled after all.     New SMART Goals:     Movement:   Domain discussed: no    Mood:   Domain discussed: yes   No goal    Meaning:   Domain discussed: Yes  No Goals    Adam Keith declined all goals this week     Coaching Call Detailed Notes 05/04/22       Symptoms:   Adam Keith continues to report pain in his leg from his injury as well as his cold symptoms.  Adam Keith reported that his pain patch wasn???t able to be filled and that he is almost out of his medication for breakthrough pain.       Movement:   Movement was not discussed this call.     In the last week Adam Keith completed 0 bouts of intentional exercise.     He has not been wearing his FitBit.        Mood:   Mood was discussed this call.    In the last week, Adam Keith reports new stressors.  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  On the other hand, he feels needed and important in his workplace and knows that people can count on  him. He is proud of that, but it doesn???t bring him joy like woodworking does.  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      Adam Keith continues to express that the only way that he will do things is on his terms and his own way.  This is why he won???t set goals or address barriers, he feels like he has to ready, or ???over it??? before he can stop.  He is hopeful that this spring will open some new doors for his woodworking.     Meaning:   Meaning and meaning-making were discussed during this call. The most important thing to [place] right now is to ???show up??? and get better (from his cold).  When I asked him what he was interested in showing up to, he said to work, but he feels like he doesn???t have a choice.      During today???s call Adam Keith decided to put woodworking on the backburner for now due to the weather.  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  Since woodworking is main tool for stress management, we talked about how he plans to manage stress this month, knowing that next week is the anniversary of his son???s death.   He has no other tools for stress management except to keep going and show up and does not want to create new tools.  We talked about this could force presence in his activities and how that might serve him and keep focused on the day-to-day rather than the future or the past.     We also talked about using February to rest and recover from his cold. Rest for Adam Keith is being in bed.  He doesn???t know what else rest could look like and didn???t want to come up with ideas.  He is either in bed because his body makes him be or doing his normal life, there is no in-between.     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  He is afraid of the ???what if??? scenarios. He has really creative and good ideas about how to care for himself and cultivate happiness, but isn???t open to setting goals to actualizing those ideas.  He declined all goal setting today.       Next call: February 13, 12pm (no call next week per patient request)

## 2022-05-10 NOTE — Unmapped (Signed)
[facility] Refill Coordination Note    Specialty Medication(s) to be Shipped:   Hematology/Oncology: Iclusig    Other medication(s) to be shipped: No additional medications requested for fill at this time     Adam Keith, DOB: 1980-10-31  Phone: [phone] (home)       All above HIPAA information was verified with patient.     Was a Nurse, learning disability used for this call? No    Completed refill call assessment today to schedule patient's medication shipment from the [facility] [phone]).  All relevant notes have been reviewed.     Specialty medication(s) and dose(s) confirmed: Regimen is correct and unchanged.   Changes to medications: Kinta reports no changes at this time.  Changes to insurance: No  New side effects reported not previously addressed with a pharmacist or physician: None reported  Questions for the pharmacist: No    Confirmed patient received a Conservation officer, historic buildings and a Surveyor, mining with first shipment. The patient will receive a drug information handout for each medication shipped and additional FDA Medication Guides as required.       DISEASE/MEDICATION-SPECIFIC INFORMATION        N/A    SPECIALTY MEDICATION ADHERENCE     Medication Adherence    Patient reported X missed doses in the last month: 0  Specialty Medication: Iclusig 30 mg  Patient is on additional specialty medications: No  Informant: patient                       Were doses missed due to medication being on hold? No    Iclusig 30 mg: 10 days of medicine on hand       REFERRAL TO PHARMACIST     Referral to the pharmacist: Not needed      [facility]     Shipping address confirmed in Epic.     Delivery Scheduled: Yes, Expected medication delivery date: 05/19/22.     Medication will be delivered via UPS to the prescription address in Epic [place].    [name]   [facility] Specialty Technician

## 2022-05-12 ENCOUNTER — Emergency Department
Admit: 2022-05-12 | Discharge: 2022-05-12 | Disposition: A | Discharge status: Home / Self Care | Payer: MEDICAID | Attending: Emergency Medicine

## 2022-05-12 ENCOUNTER — Ambulatory Visit
Admit: 2022-05-12 | Discharge: 2022-05-12 | Disposition: A | Discharge status: Home / Self Care | Payer: MEDICAID | Attending: Emergency Medicine

## 2022-05-12 DIAGNOSIS — R0600 Dyspnea, unspecified: Principal | ICD-10-CM

## 2022-05-12 LAB — COMPREHENSIVE METABOLIC PANEL
ALBUMIN: 3.9 g/dL (ref 3.4–5.0)
ALKALINE PHOSPHATASE: 97 U/L (ref 46–116)
ALT (SGPT): 37 U/L (ref 10–49)
ANION GAP: 7 mmol/L (ref 5–14)
AST (SGOT): 22 U/L (ref <=34)
BILIRUBIN TOTAL: 0.3 mg/dL (ref 0.3–1.2)
BLOOD UREA NITROGEN: 16 mg/dL (ref 9–23)
BUN / CREAT RATIO: 9
CALCIUM: 8.9 mg/dL (ref 8.7–10.4)
CHLORIDE: 107 mmol/L (ref 98–107)
CO2: 26 mmol/L (ref 20.0–31.0)
CREATININE: 1.78 mg/dL — ABNORMAL HIGH
EGFR CKD-EPI (2021) MALE: 48 mL/min/{1.73_m2} — ABNORMAL LOW (ref >=60)
GLUCOSE RANDOM: 112 mg/dL (ref 70–179)
POTASSIUM: 3.7 mmol/L (ref 3.4–4.8)
PROTEIN TOTAL: 6.3 g/dL (ref 5.7–8.2)
SODIUM: 140 mmol/L (ref 135–145)

## 2022-05-12 LAB — CBC W/ AUTO DIFF
BASOPHILS ABSOLUTE COUNT: 0.2 10*9/L — ABNORMAL HIGH (ref 0.0–0.1)
BASOPHILS RELATIVE PERCENT: 1.4 %
EOSINOPHILS ABSOLUTE COUNT: 0.3 10*9/L (ref 0.0–0.5)
EOSINOPHILS RELATIVE PERCENT: 2.5 %
HEMATOCRIT: 49.1 % — ABNORMAL HIGH (ref 39.0–48.0)
HEMOGLOBIN: 16.8 g/dL — ABNORMAL HIGH (ref 12.9–16.5)
LYMPHOCYTES ABSOLUTE COUNT: 2.5 10*9/L (ref 1.1–3.6)
LYMPHOCYTES RELATIVE PERCENT: 22.7 %
MEAN CORPUSCULAR HEMOGLOBIN CONC: 34.2 g/dL (ref 32.0–36.0)
MEAN CORPUSCULAR HEMOGLOBIN: 29.5 pg (ref 25.9–32.4)
MEAN CORPUSCULAR VOLUME: 86.2 fL (ref 77.6–95.7)
MEAN PLATELET VOLUME: 8.7 fL (ref 6.8–10.7)
MONOCYTES ABSOLUTE COUNT: 0.6 10*9/L (ref 0.3–0.8)
MONOCYTES RELATIVE PERCENT: 5.3 %
NEUTROPHILS ABSOLUTE COUNT: 7.6 10*9/L (ref 1.8–7.8)
NEUTROPHILS RELATIVE PERCENT: 68.1 %
PLATELET COUNT: 196 10*9/L (ref 150–450)
RED BLOOD CELL COUNT: 5.7 10*12/L — ABNORMAL HIGH (ref 4.26–5.60)
RED CELL DISTRIBUTION WIDTH: 14.8 % (ref 12.2–15.2)
WBC ADJUSTED: 11.2 10*9/L (ref 3.6–11.2)

## 2022-05-12 LAB — B-TYPE NATRIURETIC PEPTIDE: B-TYPE NATRIURETIC PEPTIDE: 11 pg/mL (ref <=100)

## 2022-05-12 LAB — HIGH SENSITIVITY TROPONIN I - SINGLE: HIGH SENSITIVITY TROPONIN I: 5 ng/L (ref <=53)

## 2022-05-12 MED ORDER — AZITHROMYCIN 250 MG TABLET
ORAL_TABLET | Freq: Every day | ORAL | 0 refills | 5.00000 days | Status: CP
Start: 2022-05-12 — End: 2022-05-18

## 2022-05-12 MED ORDER — BUPRENORPHINE 20 MCG/HOUR WEEKLY TRANSDERMAL PATCH
MEDICATED_PATCH | TRANSDERMAL | 0 refills | 30 days | Status: CP
Start: 2022-05-12 — End: 2022-06-11
  Filled 2022-07-04: qty 2, 1d supply, fill #0

## 2022-05-12 MED ORDER — AMOXICILLIN 875 MG-POTASSIUM CLAVULANATE 125 MG TABLET
ORAL_TABLET | Freq: Two times a day (BID) | ORAL | 0 refills | 10 days | Status: CP
Start: 2022-05-12 — End: 2022-05-22

## 2022-05-12 MED ADMIN — amoxicillin-clavulanate (AUGMENTIN) 875-125 mg per tablet 1 tablet: 1 | ORAL | @ 23:00:00 | Stop: 2022-05-12

## 2022-05-12 MED ADMIN — morphine 4 mg/mL injection 8 mg: 8 mg | INTRAVENOUS | @ 19:00:00 | Stop: 2022-05-12

## 2022-05-12 MED ADMIN — azithromycin (ZITHROMAX) tablet 500 mg: 500 mg | ORAL | @ 23:00:00 | Stop: 2022-05-12

## 2022-05-12 MED ADMIN — ketorolac (TORADOL) injection 15 mg: 15 mg | INTRAVENOUS | @ 19:00:00 | Stop: 2022-05-12

## 2022-05-12 MED ADMIN — morphine 4 mg/mL injection 8 mg: 8 mg | INTRAVENOUS | @ 23:00:00 | Stop: 2022-05-12

## 2022-05-12 NOTE — Unmapped (Addendum)
Pt here with SOB on going for about a month. Came in today for pain in R lung and both kidneys. Pt is a Leukemia pt. Pt is immunocompromised

## 2022-05-12 NOTE — Unmapped (Signed)
[name]    ED Course, Assessment, and Plan     ED Course as of 05/12/22 1451   Thu May 12, 2022   7165 42 year old male presenting to the ED for evaluation of cough, shortness of breath, chest pain, body aches, and chills over the past few days.  Vitals reassuring.  On exam patient is resting comfortably in no acute distress, actively coughing in the room, lungs clear bilaterally with normal work of breathing, abdomen soft and nontender, mentating appropriately following all commands, no JVD or lower extremity edema.  Differential would include viral respiratory illness, ACS, pneumonia, symptomatic anemia, pulmonary edema, among others.  Have lower suspicion for pulmonary embolism given normal vital signs and patient's adherent use of Eliquis.  Plan for labs, viral swab, chest x-ray, EKG.   1447 EKG without acute ischemic changes.  Chest x-ray shows possible small right upper lobe infiltrate.  CBC without leukocytosis.  Further care in the ED signed out to oncoming provider at shift change pending remainder of labs and respiratory pathogen panel.     _____________________________________________________________________    The case was discussed with the attending physician who is in agreement with the above assessment and plan.    Additional Medical Decision Making     - Any discussion of this patient's case/presentation between myself and consultants, admitting teams, or other team members has been documented above.  - Imaging and other studies, if performed, that were available during my care of the patient were independently reviewed and interpreted by me and considered in my medical decision making as documented above.  - External records reviewed: Outpatient clinic note 04/11/2022 Pulmonology note and 04/21/2022 Hematology note for patient's past medical history.    History     Chief Complaint:   Chief Complaint   Patient presents with    Shortness of Breath History of Present Illness:  Adam Keith is a 42 y.o. 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 The patient reports 1 month of ongoing shortness of breath and orthopnea with acute worsening of right-sided chest pain and bilateral lower back pain 2 days ago. He has taken oxycodone with minimal relief of his pain. He further notes cough productive of white-colored sputum, chills, and generalized body aches, as well as BLE swelling, consistent with his baseline. He endorses adherence to his anticoagulant, Eliquis. No known sick contacts. He denies fevers, nausea, vomiting, diarrhea, sore throat, or headache.     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    Current Outpatient Medications:     albuterol HFA 90 mcg/actuation inhaler, Inhale 2 puffs every six (6) hours as needed for wheezing., Disp: 18 g, Rfl: 0    amLODIPine (NORVASC) 10 MG tablet, Take 1 tablet (10 mg total) by mouth daily., Disp: 30 tablet, Rfl: 0    apixaban (  ELIQUIS) 5 mg Tab, Take 1 tablet (5 mg total) by mouth two (2) times a day., Disp: 60 tablet, Rfl: 6    arm brace (WRIST BRACE) Misc, 1 Piece by Miscellaneous route as needed. left ulnar wrist brace, Disp: 1 each, Rfl: 0    buprenorphine (BUTRANS) 20 mcg/hour PTWK transdermal patch, Place 1 patch on the skin every 5 days., Disp: 6 patch, Rfl: 0    carvediloL (COREG) 12.5 MG tablet, Take 1 tablet (12.5 mg total) by mouth Two (2) times a day., Disp: 180 tablet, Rfl: 1    cyclobenzaprine (FLEXERIL) 10 MG tablet, Take 1 tablet (10 mg total) by mouth Three (3) times a day as needed for muscle spasms., Disp: , Rfl:     docusate sodium (COLACE) 100 MG capsule, Take 1 capsule (100 mg total) by mouth daily., Disp: , Rfl:     doxycycline (VIBRA-TABS) 100 MG tablet, Take 1 tablet (100 mg total) by mouth two (2) times a day., Disp: 180 tablet, Rfl: 3    gabapentin (NEURONTIN) 300 MG capsule, Take 2 capsules (600mg ) by mouth in the morning and 3 capsules (900mg ) at bedtime. May cause drowsiness., Disp: 150 capsule, Rfl: 5    hydroCHLOROthiazide (HYDRODIURIL) 12.5 MG tablet, Take 1 tablet (12.5 mg total) by mouth daily., Disp: 30 tablet, Rfl: 11    mirtazapine (REMERON) 15 MG tablet, TAKE 1 TABLET BY MOUTH EVERY DAY AT NIGHT, Disp: 90 tablet, Rfl: 4    nortriptyline (PAMELOR) 50 MG capsule, TAKE 2 CAPSULES BY MOUTH NIGHTLY., Disp: 60 capsule, Rfl: 11    omeprazole (PRILOSEC) 40 MG capsule, Take 1 capsule (40 mg total) by mouth every morning., Disp: , Rfl:     ondansetron (ZOFRAN-ODT) 4 MG disintegrating tablet, Take 1 tablet (4 mg total) by mouth every eight (8) hours as needed for nausea., Disp: , Rfl:     oxyCODONE (ROXICODONE) 5 MG immediate release tablet, Take 1-2 tablets (5-10 mg total) by mouth every four (4) hours as needed for pain for up to 14 days., Disp: 30 tablet, Rfl: 0    PONATinib (ICLUSIG) 30 mg tablet, Take 1 tablet (30 mg total) by mouth daily. Swallow tablets whole. Do not crush, break, cut or chew tablets., Disp: 30 tablet, Rfl: 5    spironolactone (ALDACTONE) 25 MG tablet, Take 1 tablet (25 mg total) by mouth daily., Disp: 90 tablet, Rfl: 3    sulfamethoxazole-trimethoprim (BACTRIM DS) 800-160 mg per tablet, TAKE 1 TABLET BY MOUTH 2 TIMES A DAY ON SATURDAY, SUNDAY. FOR PROPHYLAXIS WHILE ON CHEMO., Disp: 48 tablet, Rfl: 3    tiotropium-olodaterol (STIOLTO RESPIMAT) 2.5-2.5 mcg/actuation Mist, Inhale 2 puffs daily., Disp: 4 g, Rfl: 3    valACYclovir (VALTREX) 500 MG tablet, Take 1 tablet (500 mg total) by mouth daily., Disp: 90 tablet, Rfl: 11  Patient's Medications   New Prescriptions    No medications on file   Previous Medications    ALBUTEROL HFA 90 MCG/ACTUATION INHALER    Inhale 2 puffs every six (6) hours as needed for wheezing.    AMLODIPINE (NORVASC) 10 MG TABLET    Take 1 tablet (10 mg total) by mouth daily.    APIXABAN (ELIQUIS) 5 MG TAB    Take 1 tablet (5 mg total) by mouth two (2) times a day.    ARM BRACE (WRIST BRACE) MISC    1 Piece by Miscellaneous route as needed. left ulnar wrist brace    CARVEDILOL (COREG) 12.5 MG TABLET    Take  1 tablet (12.5 mg total) by mouth Two (2) times a day.    CYCLOBENZAPRINE (FLEXERIL) 10 MG TABLET    Take 1 tablet (10 mg total) by mouth Three (3) times a day as needed for muscle spasms.    DOCUSATE SODIUM (COLACE) 100 MG CAPSULE    Take 1 capsule (100 mg total) by mouth daily.    DOXYCYCLINE (VIBRA-TABS) 100 MG TABLET    Take 1 tablet (100 mg total) by mouth two (2) times a day.    GABAPENTIN (NEURONTIN) 300 MG CAPSULE    Take 2 capsules (600mg ) by mouth in the morning and 3 capsules (900mg ) at bedtime. May cause drowsiness.    HYDROCHLOROTHIAZIDE (HYDRODIURIL) 12.5 MG TABLET    Take 1 tablet (12.5 mg total) by mouth daily.    MIRTAZAPINE (REMERON) 15 MG TABLET    TAKE 1 TABLET BY MOUTH EVERY DAY AT NIGHT    NORTRIPTYLINE (PAMELOR) 50 MG CAPSULE    TAKE 2 CAPSULES BY MOUTH NIGHTLY.    OMEPRAZOLE (PRILOSEC) 40 MG CAPSULE    Take 1 capsule (40 mg total) by mouth every morning.    ONDANSETRON (ZOFRAN-ODT) 4 MG DISINTEGRATING TABLET    Take 1 tablet (4 mg total) by mouth every eight (8) hours as needed for nausea.    OXYCODONE (ROXICODONE) 5 MG IMMEDIATE RELEASE TABLET    Take 1-2 tablets (5-10 mg total) by mouth every four (4) hours as needed for pain for up to 14 days.    PONATINIB (ICLUSIG) 30 MG TABLET    Take 1 tablet (30 mg total) by mouth daily. Swallow tablets whole. Do not crush, break, cut or chew tablets.    SPIRONOLACTONE (ALDACTONE) 25 MG TABLET    Take 1 tablet (25 mg total) by mouth daily.    SULFAMETHOXAZOLE-TRIMETHOPRIM (BACTRIM DS) 800-160 MG PER TABLET    TAKE 1 TABLET BY MOUTH 2 TIMES A DAY ON SATURDAY, SUNDAY. FOR PROPHYLAXIS WHILE ON CHEMO.    TIOTROPIUM-OLODATEROL (STIOLTO RESPIMAT) 2.5-2.5 MCG/ACTUATION MIST    Inhale 2 puffs daily.    VALACYCLOVIR (VALTREX) 500 MG TABLET    Take 1 tablet (500 mg total) by mouth daily.   Modified Medications    Modified Medication Previous Medication    BUPRENORPHINE (BUTRANS) 20 MCG/HOUR PTWK TRANSDERMAL PATCH buprenorphine (BUTRANS) 20 mcg/hour PTWK transdermal patch       Place 1 patch on the skin every 5 days.    Place 1 patch on the skin Q 5 days.   Discontinued Medications    No medications on file       Allergies:   Bupropion hcl, Cefepime, Ceftaroline fosamil, Dapsone, Onion, Vancomycin analogues, Bismuth subsalicylate, Privigen [immun glob g(igg)-pro-iga 0-50], Furosemide, and Gammagard    Past Surgical History:   Past Surgical History:   Procedure Laterality Date    BONE MARROW BIOPSY & ASPIRATION  01/21/2020         CHG [place], CHEST,REAL TIME  11/20/2020    Procedure: ULTRASOUND, CHEST, REAL TIME WITH IMAGE DOCUMENTATION;  Surgeon: Jerelyn Charles, MD;  Location: BRONCH PROCEDURE LAB [facility];  Service: Pulmonary    IR INSERT PORT AGE GREATER THAN 5 YRS  03/10/2020    IR INSERT PORT AGE GREATER THAN 5 YRS 03/10/2020 [name], MD IMG VIR [facility]    PR BRONCHOSCOPY,DIAGNOSTIC W LAVAGE Bilateral 07/20/2020    Procedure: BRONCHOSCOPY, RIGID OR FLEXIBLE, INCLUDE FLUOROSCOPIC GUIDANCE WHEN PERFORMED; W/BRONCHIAL ALVEOLAR LAVAGE WITH MODERATE SEDATION;  Surgeon: Dellis Filbert, MD;  Location: BRONCH PROCEDURE LAB [facility];  Service: Pulmonary       Social History:   Social History     Tobacco Use    Smoking status: Every Day     Current packs/day: 2.00     Types: Cigarettes     Passive exposure: Current    Smokeless tobacco: Former     Types: Chew   Substance Use Topics    Alcohol use: Yes     Comment: Rarely       Family History:  Family History   Problem Relation Age of Onset    Cancer Mother     No Known Problems Father     No Known Problems Sister     No Known Problems Brother     No Known Problems Maternal Aunt     No Known Problems Maternal Uncle     No Known Problems Paternal Aunt     No Known Problems Paternal Uncle     No Known Problems Maternal Grandmother     Cancer Maternal Grandfather     No Known Problems Paternal Grandmother     No Known Problems Paternal Grandfather     Melanoma Neg Hx     Basal cell carcinoma Neg Hx     Squamous cell carcinoma Neg Hx         Physical Exam     Vital Signs:    BP 129/78  - Pulse 85  - Temp 36.6 ??C (97.8 ??F) (Oral)  - Resp 16  - SpO2 95%     General: Awake, in no distress.  Skin: Warm, dry.  Heart: Regular rhythm, normal heart sounds.  Lungs: Normal work of breathing.  Abdomen: Soft, non-tender.  Musculoskeletal: No deformities or tenderness.  Neurological: No gross focal neurologic deficits are appreciated.  Psychiatric: Normal affect and behavior for situation.  _____________________________________________________________________    Please note - This documentation was generated using dictation and/or voice recognition software, and as such, may contain spelling or other transcription errors. Any questions regarding the content of this documentation should be directed to the individual who electronically signed.    Documentation assistance was provided by Johnathan Hausen, Scribe, on May 12, 2022 at 12:54 PM for Margo Aye, MD.    May 12, 2022 12:54 PM. Documentation assistance provided by the scribe. I was present during the time the encounter was recorded. The information recorded by the scribe was done at my direction and has been reviewed and validated by me.      Anders Grant, MD  Resident  05/12/22 [phone]

## 2022-05-12 NOTE — Unmapped (Shared)
Received sign out from previous provider.    Patient Summary: Adam Keith is a 42 y.o. 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 Thus far, his basic labs were within normal limits without leukocytosis, creatinine above baseline, or electrolyte abnormalities. Troponin and BNP within normal limits. Chest X-ray without acute cardiopulmonary abnormalities. Of note, the patient had a CTA chest 04/07/22 while having the same symptoms which revealed no PE or acute findings.   Action List:   Pending respiratory pathogen panel.     Updates         Documentation assistance was provided by Vilinda Blanks, Scribe on May 12, 2022 at 3:24 PM for Jamelle Rushing, MD.    {*** note to provider: please use .[email or web address] to enter a scribe attestation}

## 2022-05-13 NOTE — Unmapped (Signed)
Spoke with Mr. Fetch via telephone in regards to mychart sent to [name], AGNP about symptoms over the past month. Due to patient's complex history and symptoms noted (Desaturations to 80's on RA, decreased stamina, pain in lungs/kidney area) our team recommends he present to ED for evaluation. Patient in agreement and I personally sent message to ED charge nurse to make them aware.     Elicia Lamp, RN

## 2022-05-18 MED FILL — ICLUSIG 30 MG TABLET: ORAL | 30 days supply | Qty: 30 | Fill #5

## 2022-05-20 NOTE — Unmapped (Signed)
Adam Keith reported to health coach that he continues to have shortness of breath and chills, despite taking his Z-Pak and antibiotic that were prescribed in the ED. Does have history of abx resistance. Adam Keith said that his symptoms are better than when he went to the ED, but he continues to have symptoms and is now off the abx. Reports that he is not having fevers. Adam Keith has not reported his symptoms to his oncology team since his ED visit on 2/8. Adam Keith said he will message his oncology team [name]) today to make them aware of continued symptoms.    On chart review, should have 2 days remaining for 10-day abx script. Is followed by ID.    Owens Shark, BSN, RN, [facility]   She/Her/Hers  Oncology Nurse Navigator - [employer] Resource Nurse  [facility]  [address] [place], [place] [address]    Center: [phone]  Voicemail: [phone]  [email or web address]

## 2022-05-20 NOTE — Unmapped (Signed)
Summary statement:    This is a 42 year old with a history of acute leukemia, currently being treated with panatinib. The participant reports that since the last call, there have been unplanned ED visits. The participant reports that since the last call, there were not discussions with the clinical team about treatment planning or about the overall disease course. He has not been meeting or setting goals.       Coaching Call Review 05/17/22  Adam Keith reported that he went to the ER last week with pain and trouble breathing. He continues to be frustrated by lack of answers from medical providers about what is causing these issues. 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     Weekly Report for week starting 05/17/2022  Please click below to view a more detailed report on our [employer] website:  weekly report 05/17/2022 * Please hold the shift key when clicking the hyperlink to access your patient???s report   The following symptoms have been flagged with an alert this week 05/17/2022:  Pain    New SMART Goals:     Movement:   Domain discussed: no    Mood:   Domain discussed: yes   No goal    Meaning:   Domain discussed: Yes  Goal 1: Adam Keith will get in the workshop as much as possible since he has many days off this week      Coaching Call Detailed Notes 05/17/22       Symptoms:   Adam Keith continues to report pain, cold sweats and body aches.  He indicated that the antibiotic is helping, but also making him feel poorly.  It is overall getting better, but not resolved.      Movement:   Movement was not discussed this call.     In the last week [name] completed 0 bouts of intentional exercise.     He has not been wearing his FitBit.        Mood:   Mood was discussed this call.    In the last week, Adam Keith reports no new stressors.  Adam Keith reported that last week was the week of the anniversary of his son, Colbie???s, death.  He and his wife did nothing to honor the day.  He spent the day making sure his wife was ok.  He continues to express frustration about his health and feels like it is out of his control.     [name]???s primary coping mechanism is to ???keep going???, but he couldn???t indicate what that might look like this week.  He was pleased to report that he did complete everything that was in the queue at the shop.     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 While this is a large job, he indicated that it is not a great use of his time or skills, but is pleased about the publicity this will bring him.     Meaning:   Meaning and meaning-making were discussed during this call. Adam Keith is eager and motivated to be in the woodshop.  He is limited by his health and the weather and feels like there is nothing he can do to address those barriers.     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  He is afraid of the ???what if??? scenarios. He has really creative and good ideas about how to care for himself and cultivate happiness, but isn???t open to setting goals to actualizing those ideas.  He declined all goal setting today.       Next call: February 20, 11am

## 2022-05-26 ENCOUNTER — Telehealth
Admit: 2022-05-26 | Discharge: 2022-05-27 | Payer: MEDICAID | Attending: Student in an Organized Health Care Education/Training Program | Primary: Student in an Organized Health Care Education/Training Program

## 2022-05-26 DIAGNOSIS — Z515 Encounter for palliative care: Principal | ICD-10-CM

## 2022-05-26 DIAGNOSIS — G893 Neoplasm related pain (acute) (chronic): Principal | ICD-10-CM

## 2022-05-26 MED ORDER — OXYCODONE 5 MG TABLET
ORAL_TABLET | ORAL | 0 refills | 8 days | Status: CP | PRN
Start: 2022-05-26 — End: 2022-06-09

## 2022-05-27 NOTE — Unmapped (Cosign Needed)
Summary Statement:    This is a 42 year old with a history of acute leukemia, currently being treated with panatinib. The participant reports that since the last call, there have not been unplanned ED visits. The participant reports that since the last call, there were not discussions with the clinical team about treatment planning or about the overall disease course. He has not been meeting or setting goals.       Coaching Call Review 05/24/22  Adam Keith continues to report feeling poorly as a result of his cold symptoms. He did not finish his antibiotic as prescribed and still feels sick.  He reported ear pain for three days and loss of hearing in that ear.  He is frustrated by his poor physical health.  Adam Keith was at work during our call and had to leave the call abruptly.     Weekly Report for week starting 05/24/2022  Please click below to view a more detailed report on our [employer] website:  weekly report 05/25/2022 * Please hold the shift key when clicking the hyperlink to access your patient???s report     The following symptoms have been flagged with an alert this week 05/24/2022:  Pain, Depression    New SMART Goals:     Movement:   Domain discussed: yes   No goal    Mood:   Domain discussed: yes   No goal    Meaning:   Domain discussed: Yes  No goal      Coaching Call Detailed Notes 05/24/22       Symptoms:   Adam Keith continues to report pain, and cold-symptoms.  He indicated that he stopped the antibiotic before finishing it and now has ear pain.  Adam Keith also reported trouble sleeping, decreased motivation, and increased fatigue.       Movement:   Movement was discussed this call.     In the last week [name] completed 0 bouts of intentional exercise.  Adam Keith is frustrated that he cannot do the things that he wants to do because he limited physically.  All of the things that he enjoys doing are physical and he is unable to perform those things due to his fatigue.  He was unable to generate alternatives to the things that he likes (example: working in his woodshop, working, and working on his boat).  He was also unwilling to ask for help or train someone to help him around the shop.     He has not been wearing his FitBit.        Mood:   Mood was discussed this call.    In the last week, Adam Keith reports significant frustration with his physical health.  He is disheartened by his pain management plan and his inability to do the things he wants to do.  He also reported being unmotivated to do the things that previously made him happy and had to stop working in his workshop because of frustration over the weekend.       He reported that he isn???t sure if he will abel to continue working. He isn't sure how much longer he will be able to work because of his health.   Being out in the elements is problematic for him.  He isn't sure if being sick is just his new normal and he isn't ok with that.  He doesn't have the go and he is sleeping 16 hours a day.  He doesn't want to have to sleep that much to have energy.  He isn't sure if it is his medicines or just being sick.   He has not talked to [facility] about changing his anti-depressants.  He talks with her again in March.    When asked what feels good Adam Keith reported ???nothing???.  Being in the woodshop and being at work don???t feel good anymore and he ???could care less??? about those things now.   I asked if he was having thoughts of hurting himself or others and he indicated no.     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 He was not open to going on disability to support himself and his family.     Meaning:   Meaning and meaning-making were discussed during this call. Adam Keith previously was eager and motivated to be in the woodshop, but that was not the case today.  He was frustrated by his health and the weather and feels like there is nothing he can do to address those barriers.     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  He is afraid of the ???what if??? scenarios. He has really creative and good ideas about how to care for himself and cultivate happiness, but isn???t open to setting goals to actualizing those ideas.  He declined all goal setting today and had to leave the call early before we could schedule our next call.       Next call: TBD

## 2022-05-27 NOTE — Unmapped (Signed)
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 He was recently admitted to [facility] with lowe extremity pain and edema. Has had episodic fluctuations in mood but currently doing fairly well.  Has decided not to pursue bone marrow transplant at this time.     Assessment/Plan:      # Depressed Mood: Mood has been vacillating recently as he continues to struggle with not being able to do things he is used to, like working in his woodshop, without being affected by pain and fatigue. Still intermittent periods of depression, but he is hopeful this will improve with improvement in pain management.His wife is his primary support, and he also follows with psych and a therapist.  he reports a worsening of his depressive and anxiety symptoms recently in the context of persistent cough, fatigue, ENT infections, and pain over the past two months.    -Increase mirtazapine from 15 mg to 30 mg at bedtime   -Medications per [name]; Dr. Genice Rouge to contact her after visit today        #Pain: Still with persistent pain, primarily in back and flank regions, which limits him in his daily activities. More recently he has been having more significant pain in his rib region, this in conjunction with worsened respiratory symptoms/coughing. Recently discontinued gabapentin. He stated that he has not recently been applying butrans patch as he was told by pharmacy this was contraindicated with his use of doxycycline.  He felt that this was initially somewhat effective but has not seemed to help much lately. He is not interested in re-trialing oxycontin.     -Continue gabapentin to 600mg  qAM and 900mg  qbedtime  -No longer using Dilaudid dilaudid 2mg  PO BID PRN  - Will renew oxycodone 5 mg to 10 mg q4 prn for breakthrough pain  - continue buprenorphine patch 20 micrograms every 5 days  -Encouraged patient to reach out via MyChart with updates in how he is doing in light of these changes    #Persistent reproductive cough, ENT infections, low grade fevers  -He endorses the above symptoms for the past two months. He has been on different oral antibiotics which do not seem to have provided much relief.    -I will message ID team to try to get patient scheduled for follow-up evaluation (he was last seen in August).      # LUE pain: Improved.   - Pain management as above     #Chronic pain: At baseline, see above  - Pain management as above     #Advance care planning: Continues to prioritize quality of life.  After discussion with infectious disease as well as oncology has made the decision not to pursue another bone marrow transplant at this time.     #Controlled substances risk management:  Patient does not have a signed pain medication agreement with our team.  [place] database was reviewed today and it was appropriate.  Urine drug screen was not performed at this visit. Findings: not applicable.  Patient has received information about safe storage and administration of medications.  Patient has not received a prescription for narcan; is not applicable.      F/u: Six weeks     ----------------------------------------  Referring Provider: Dr. Berline Lopes  Oncology Team: Malignant hematology team  PCP: [name], MD     HPI: 42 year old man with a diagnosis of  B-ALL, diagnosed in October 2021, now in complete remission. Recently admitted to the [facility] ICU 03/06/21-03/20/21 for AHRF d/t MRSA/klebsiella PNA, requiring intubation. This is following admissions in August, May, April, March, February, and January 2022.     Interval History:   He reports that he has not felt well over the prior two months. He reports a persistent productive cough with associated shortness of breath which does not seem to have improved at all despite multiple oral antibiotic courses.  He also reports recent ear infection, in addition to recent low-grade fevers.    He feels like these symptoms have contributed to worsening of his pain. He states that he was told the pharmacy could not fill his butrans patch recently because there was a reported contraindication with the suppressive doxycycline he takes.  He states that his fatigue has worsened during this time, and that he has not been able to go outside, work regularly, and do other activities he typically enjoys due to all of this.  As a result, he reports his depression and anxiety as worsening significantly over this time period.      He states he believes if he could get over the combination of above symptoms, his pain would be more manageable and his mental status would be much improved.                   Palliative Performance Scale: 90% - Ambulation: Full / Normal Activity, some evidence of disease / Self-Care:Full / Intake: Normal / Level of Conscious: Full     Coping/Support Issues: Having a lot of difficulty coping with his current setback. Well supported by his wife. Also has a psychiatrist and therapist.     Goals of Care: Treatment and cancer-directed therapy     Social History:  Name of primary support: Wife Adam Keith, 2 daughters.  Occupation: Was in the [employer]	[place]  Hobbies: Woodworking  Current residence / distance from [facility]: [place]     Has a 71 year old son.      Advance Care Planning: Did not discuss this visit.        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    Onion Anaphylaxis    Vancomycin Analogues Other (See Comments)       Hearing loss with Lasix     Other reaction(s): Other (See Comments)     Hearing loss     lasix     Hearing loss      lasix      Hearing loss with Lasix Other reaction(s): Other (See Comments) Hearing loss lasix    Bismuth Subsalicylate Nausea And Vomiting    Privigen [Immun Glob G(Igg)-Pro-Iga 0-50] Other (See Comments)       03/10/2021 VIG infusion stopped as patient developed rigors, tachycardia and HTN during infusion despite pre-medication; 03/12/21 completed IVIG with premedications and slower rate of infusion    Furosemide Other (See Comments)       With Vancomycin caused hearing loss  Other reaction(s): Other (See Comments)     With Vancomycin caused hearing loss      With Vancomycin caused hearing loss Other reaction(s): Other (See Comments) With Vancomycin caused hearing loss    Gammagard           Family History:  Cancer-related family history includes Cancer in his maternal grandfather and mother. There is no history of Melanoma.  He indicated that the status of his mother is unknown. He indicated that the status of his father is unknown. He indicated that the status of his sister is unknown. He indicated that the status of his brother is unknown. He indicated that the status of his maternal grandmother is unknown. He indicated that the status of his maternal grandfather is unknown. He indicated that the status of his paternal grandmother is unknown. He indicated that the status of his paternal grandfather is unknown. He indicated that the status of his maternal aunt is unknown. He indicated that the status of his maternal uncle is unknown. He indicated that the status of his paternal aunt is unknown. He indicated that the status of his paternal uncle is unknown. He indicated that the status of his neg hx is unknown.        REVIEW OF SYSTEMS:  A comprehensive review of 10 systems was negative except for pertinent positives noted in HPI.           Lab Results   Component Value Date     CREATININE 1.93 (H) 02/22/2022            Lab Results   Component Value Date     ALKPHOS 92 02/22/2022     BILITOT 0.4 02/22/2022     BILIDIR 0.20 02/07/2022 PROT 6.6 02/22/2022     ALBUMIN 4.1 02/22/2022     ALT 96 (H) 02/22/2022     AST 45 (H) 02/22/2022      Physical exam:  Gen: NAD  Pulm: nl WOB  Skin: no rashes on exposed skin  Psych: A&O x3      Patient at home in [place] at time of visit.      Total time, prior to, during, and after visit: 40 minutes.   {  Nehemiah Settle, MD  HPM Fellow

## 2022-05-30 DIAGNOSIS — D849 Immunodeficiency, unspecified: Principal | ICD-10-CM

## 2022-05-30 DIAGNOSIS — C9101 Acute lymphoblastic leukemia, in remission: Principal | ICD-10-CM

## 2022-05-30 NOTE — Unmapped (Addendum)
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 krusei fungemia  - Prior chemotherapy: [id number] induction with dasatinib (vincristine, dexamethasone and dasatinib); C1D1 01/24/2020; difficulty tolerating single-agent dasatinib  - Current chemotherapy: blinatumomab (anti-CD19/CD3) + ponatinib (TKI) last in Oct 2022; 01/2021 now on ponatinib monotherapy maintenance  - 01/11/21: Normocellular bone marrow (30% overall) with trilineage hematopoiesis and less than 1% blasts by manual aspirate differential; Flow cytometry MRD analysis reveals no definitive immunophenotypic evidence of residual B lymphoblastic leukemia; BCR-ABL p210 transcripts were detected at a level of 0.002 IS % ratio in bone marrow.  -06/24/21: Peripheral blood - lack of detectable BCR-ABL1 p210 RNA suggests molecular remission of leukemia  -11/2021: elected not to proceed with BMT     # Hypogammaglobulimia  - 11/01/20 IgG 266 declined IVIG  - 03/11/21 IgG 148 - IVIG infusion stopped as patient developed rigors, tachycardia and HTN during infusion despite pre-medication   - 03/12/21 IgG 213 - completed IVIG with premedications and slower rate of infusion  -03/17/21 IgG 632  -06/24/21 IgG 292 - IVIG infusion stopped as patient did not tolerate despite pre-medication  -10/19/21 IgG 321     # Mild CKD  baseline Cr 1.7    Pertinent Co-morbidities  # COPD/emphysema  # DIHS/DRESS ceftaroline 06/12/2020, relapse after cefepime 11/02/2020  - per prior ID notes possible culprits ceftaroline, posaconazole, dasatinib, sotrovimab  # GERD on omeprazole, 8/2  # Hearing loss; new onset L sided hearing loss, 10/2021        Pertinent Exposure History   Active smoking  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 Juliene Pina felt chest pain is appropriate in setting of medically managed empyema and risk of thoracotomy outweighed benefit given clinical symptoms  - 03/05/21 MRSA pneumonia after influenza infection requiring ICU admission/intubation  - 12/11 CT Chest : Multifocal consolidative airspace disease, nonspecific small pleural effusion in the dependent aspect of the left pleural space and trace loculated collections within the lateral aspect of the right chest.  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     Antimicrobial Intolerance/allergy  Cefepime - DRESS/DiHS  Ceftaroline - DRESS/DiHS  Dapsone - possible agranulocytosis, per chart anaphylaxis  Vancomycin - probable ototoxicity (in combination with furosemide)  Isavuconazole - elevated LFTs in the setting of TKI  Linezolid - peripheral neuropathy; previously tolerated tedizolid  Aztreonam+linezolid - patient reported ear pain and worsening hearing (in the setting COVID-19)      RECOMMENDATIONS    Management  Continue oral doxycycline 100mg  BID for recurrent MRSA pneumonia/ empyema for chronic suppression given recurrent, severe MRSA infections.  Follow up with ENT for hearing aid and consideration of sinus surgery  Wear O2 to keep oxygen above 90%  Has OSA but cannot tolerate CPAP  Quit or cut back on smoking  RPP, sinus cx, CMV PCR, Fungitell today -> molnupiravir 5d (I'm not certain that he will take this but Rx sent)  Unable to tolerate IgG    Antimicrobial prophylaxis required for hematological malignancy   Declines COVID 19 vaccine and flu vaccines  Continue TMP-SMX BID S/S (prescribed 08/31/21) for PJP ppx.   Continue oral valacyclovir 500mg  daily.     Intensive toxicity monitoring for prescription antimicrobials   clinical assessments for rashes or other skin changes.  Sun screen while on doxycycline    Addendum 06/04/22:  Sinus cx reuslts from 2/27 reviewed and showed growth of OPF; CMV PCR and serum BD glucan neg. Called patient and left a voice message re results as was unable to get through.     Follow up 3 months          Recommendations were communicated via shared medical record.    Adam Hamburger, MD  [facility] Division of Infectious Diseases      Subjective     External record(s):  ED notes from 2/8 reviewed: presented with dyspnea, cough, body aches,chills. CTA no PE. RPP negative. Prescrib ed amox+clav and azithromycinAlso reviewed palliative onc note from 2/22, endorsed productive cough, low grade fevers, ENT infections  .    Independent historian(s): no independent historian required.       Interval History:   Per above.  Additionally,   1.5 pack per day  SOB with walking  Symptoms as describes above continue    Medications:    Current Outpatient Medications:     albuterol HFA 90 mcg/actuation inhaler, Inhale 2 puffs every six (6) hours as needed for wheezing., Disp: 18 g, Rfl: 0    amLODIPine (NORVASC) 10 MG tablet, Take 1 tablet (10 mg total) by mouth daily., Disp: 30 tablet, Rfl: 0    apixaban (ELIQUIS) 5 mg Tab, Take 1 tablet (5 mg total) by mouth two (2) times a day., Disp: 60 tablet, Rfl: 6    arm brace (WRIST BRACE) Misc, 1 Piece by Miscellaneous route as needed. left ulnar wrist brace, Disp: 1 each, Rfl: 0    azithromycin (ZITHROMAX Z-PAK) 250 MG tablet, Take 1 tablet (250 mg total) by mouth daily. 1 by mouth daily for 4 days, Disp: 4 tablet, Rfl: 0    buprenorphine (BUTRANS) 20 mcg/hour PTWK transdermal patch, Place 1 patch on the skin every 5 days., Disp: 6 patch, Rfl: 0    carvediloL (COREG) 12.5 MG tablet, Take 1 tablet (12.5 mg total) by mouth Two (2) times a day., Disp: 180 tablet, Rfl: 1    cyclobenzaprine (FLEXERIL) 10 MG tablet, Take 1 tablet (10 mg total) by mouth Three (3) times a day  as needed for muscle spasms., Disp: , Rfl:     docusate sodium (COLACE) 100 MG capsule, Take 1 capsule (100 mg total) by mouth daily., Disp: , Rfl:     doxycycline (VIBRA-TABS) 100 MG tablet, Take 1 tablet (100 mg total) by mouth two (2) times a day., Disp: 180 tablet, Rfl: 3    hydroCHLOROthiazide (HYDRODIURIL) 12.5 MG tablet, Take 1 tablet (12.5 mg total) by mouth daily., Disp: 30 tablet, Rfl: 11    mirtazapine (REMERON) 15 MG tablet, TAKE 1 TABLET BY MOUTH EVERY DAY AT NIGHT, Disp: 90 tablet, Rfl: 4    nortriptyline (PAMELOR) 50 MG capsule, TAKE 2 CAPSULES BY MOUTH NIGHTLY., Disp: 60 capsule, Rfl: 11    omeprazole (PRILOSEC) 40 MG capsule, Take 1 capsule (40 mg total) by mouth every morning., Disp: , Rfl:     ondansetron (ZOFRAN-ODT) 4 MG disintegrating tablet, Take 1 tablet (4 mg total) by mouth every eight (8) hours as needed for nausea., Disp: , Rfl: oxyCODONE (ROXICODONE) 5 MG immediate release tablet, Take 1-2 tablets (5-10 mg total) by mouth every four (4) hours as needed for pain for up to 14 days., Disp: 90 tablet, Rfl: 0    PONATinib (ICLUSIG) 30 mg tablet, Take 1 tablet (30 mg total) by mouth daily. Swallow tablets whole. Do not crush, break, cut or chew tablets., Disp: 30 tablet, Rfl: 5    spironolactone (ALDACTONE) 25 MG tablet, Take 1 tablet (25 mg total) by mouth daily., Disp: 90 tablet, Rfl: 3    sulfamethoxazole-trimethoprim (BACTRIM DS) 800-160 mg per tablet, TAKE 1 TABLET BY MOUTH 2 TIMES A DAY ON SATURDAY, SUNDAY. FOR PROPHYLAXIS WHILE ON CHEMO., Disp: 48 tablet, Rfl: 3    tiotropium-olodaterol (STIOLTO RESPIMAT) 2.5-2.5 mcg/actuation Mist, Inhale 2 puffs daily., Disp: 4 g, Rfl: 3    valACYclovir (VALTREX) 500 MG tablet, Take 1 tablet (500 mg total) by mouth daily., Disp: 90 tablet, Rfl: 11    Objective     Vital signs:  BP 143/92  - Pulse 83  - Temp 36.5 ??C (97.7 ??F) (Oral)  - SpO2 97%     Physical Exam:  Const [x]  vital signs above    [x]  NAD, non-toxic appearance []  Chronically ill-appearing, non-distressed        Eyes [x]  Lids normal bilaterally, conjunctiva anicteric and noninjected OU     [] PERRL  [] EOMI        ENMT [x]  Normal appearance of external nose and ears, no nasal discharge        [x]  MMM, no lesions on lips or gums []  No thrush, leukoplakia, oral lesions  []  Dentition good []  Edentulous []  Dental caries present  []  Hearing normal  []  TMs with good light reflexes bilaterally         Neck []  Neck of normal appearance and trachea midline        []  No thyromegaly, nodules, or tenderness   []  Full neck ROM        Lymph []  No LAD in neck     []  No LAD in supraclavicular area     []  No LAD in axillae   []  No LAD in epitrochlear chains     []  No LAD in inguinal areas        CV []  RRR            []  No peripheral edema     []  Pedal pulses intact   []  No abnormal heart sounds appreciated   []  Extremities WWP  Slight PE bilaterally      GI []  Normal inspection, NTND   []  NABS     []  No umbilical hernia on exam       []  No hepatosplenomegaly     []  Inspection of perineal and perianal areas normal        GU []  Normal external genitalia     [] No urinary catheter present in urethra   []  No CVA tenderness    []  No tenderness over renal allograft        MSK []  No clubbing or cyanosis of hands       []  No vertebral point tenderness  []  No focal tenderness or abnormalities on palpation of joints in RUE, LUE, RLE, or LLE        Skin [x]  No rashes, lesions, or ulcers of visualized skin     []  Skin warm and dry to palpation         Neuro [x]  Face expression symmetric  []  Sensation to light touch grossly intact throughout    []  Moves extremities equally    []  No tremor noted        []  CNs II-XII grossly intact     []  DTRs normal and symmetric throughout []  Gait unremarkable  Hearing very decreased      Psych [x]  Appropriate affect       [x]  Fluent speech         [x]  Attentive, good eye contact  [x]  Oriented to person, place, time          [x]  Judgment and insight are appropriate           Data for Medical Decision Making     I discussed mgm't w/qualified health care professional(s) involved in case: hem-onc provider .    I reviewed CBC results (WBC normal, Hb elevated on 2/8 labs), micro result(s) (RPP negative), and radiology report(s) (CXR no infiltrate).    I independently visualized/interpreted not done.       No results in the last week    Lab Results   Component Value Date    CRP 44.0 (H) 12/28/2020    Total IgG 284 (L) 12/02/2021       Microbiology:  RPP negative 2/8    Imaging:  CTA PE 1/4  : Unchanged right upper lobe architectural distortion with associated scarring and volume loss. No consolidative airspace disease. Moderate emphysema. Diffuse bronchial wall thickening. No pleural effusion or pneumothorax.

## 2022-05-31 ENCOUNTER — Other Ambulatory Visit: Admit: 2022-05-31 | Discharge: 2022-06-01 | Payer: MEDICAID

## 2022-05-31 ENCOUNTER — Ambulatory Visit: Admit: 2022-05-31 | Discharge: 2022-06-01 | Payer: MEDICAID | Attending: Adult Health | Primary: Adult Health

## 2022-05-31 ENCOUNTER — Ambulatory Visit
Admit: 2022-05-31 | Discharge: 2022-06-01 | Payer: MEDICAID | Attending: Infectious Disease | Primary: Infectious Disease

## 2022-05-31 DIAGNOSIS — U071 COVID-19 virus infection: Principal | ICD-10-CM

## 2022-05-31 DIAGNOSIS — C9100 Acute lymphoblastic leukemia not having achieved remission: Principal | ICD-10-CM

## 2022-05-31 DIAGNOSIS — R059 Cough, unspecified type: Principal | ICD-10-CM

## 2022-05-31 DIAGNOSIS — C9101 Acute lymphoblastic leukemia, in remission: Principal | ICD-10-CM

## 2022-05-31 DIAGNOSIS — R0981 Nasal congestion: Principal | ICD-10-CM

## 2022-05-31 DIAGNOSIS — D849 Immunodeficiency, unspecified: Principal | ICD-10-CM

## 2022-05-31 DIAGNOSIS — H918X3 Other specified hearing loss, bilateral: Principal | ICD-10-CM

## 2022-05-31 LAB — CMV DNA, QUANTITATIVE, PCR: CMV VIRAL LD: NOT DETECTED

## 2022-05-31 LAB — CBC W/ AUTO DIFF
BASOPHILS ABSOLUTE COUNT: 0.1 10*9/L (ref 0.0–0.1)
BASOPHILS RELATIVE PERCENT: 1.2 %
EOSINOPHILS ABSOLUTE COUNT: 0.2 10*9/L (ref 0.0–0.5)
EOSINOPHILS RELATIVE PERCENT: 2.4 %
HEMATOCRIT: 49.9 % — ABNORMAL HIGH (ref 39.0–48.0)
HEMOGLOBIN: 17.4 g/dL — ABNORMAL HIGH (ref 12.9–16.5)
LYMPHOCYTES ABSOLUTE COUNT: 2 10*9/L (ref 1.1–3.6)
LYMPHOCYTES RELATIVE PERCENT: 20.7 %
MEAN CORPUSCULAR HEMOGLOBIN CONC: 34.9 g/dL (ref 32.0–36.0)
MEAN CORPUSCULAR HEMOGLOBIN: 29.8 pg (ref 25.9–32.4)
MEAN CORPUSCULAR VOLUME: 85.4 fL (ref 77.6–95.7)
MEAN PLATELET VOLUME: 9 fL (ref 6.8–10.7)
MONOCYTES ABSOLUTE COUNT: 0.5 10*9/L (ref 0.3–0.8)
MONOCYTES RELATIVE PERCENT: 4.8 %
NEUTROPHILS ABSOLUTE COUNT: 7 10*9/L (ref 1.8–7.8)
NEUTROPHILS RELATIVE PERCENT: 70.9 %
PLATELET COUNT: 184 10*9/L (ref 150–450)
RED BLOOD CELL COUNT: 5.84 10*12/L — ABNORMAL HIGH (ref 4.26–5.60)
RED CELL DISTRIBUTION WIDTH: 14.8 % (ref 12.2–15.2)
WBC ADJUSTED: 9.9 10*9/L (ref 3.6–11.2)

## 2022-05-31 LAB — COMPREHENSIVE METABOLIC PANEL
ALBUMIN: 4.2 g/dL (ref 3.4–5.0)
ALKALINE PHOSPHATASE: 97 U/L (ref 46–116)
ALT (SGPT): 49 U/L (ref 10–49)
ANION GAP: 7 mmol/L (ref 5–14)
AST (SGOT): 27 U/L (ref <=34)
BILIRUBIN TOTAL: 0.4 mg/dL (ref 0.3–1.2)
BLOOD UREA NITROGEN: 14 mg/dL (ref 9–23)
BUN / CREAT RATIO: 8
CALCIUM: 9 mg/dL (ref 8.7–10.4)
CHLORIDE: 108 mmol/L — ABNORMAL HIGH (ref 98–107)
CO2: 24 mmol/L (ref 20.0–31.0)
CREATININE: 1.75 mg/dL — ABNORMAL HIGH
EGFR CKD-EPI (2021) MALE: 49 mL/min/{1.73_m2} — ABNORMAL LOW (ref >=60)
GLUCOSE RANDOM: 117 mg/dL (ref 70–179)
POTASSIUM: 4.2 mmol/L (ref 3.4–4.8)
PROTEIN TOTAL: 6.8 g/dL (ref 5.7–8.2)
SODIUM: 139 mmol/L (ref 135–145)

## 2022-05-31 MED ORDER — MOLNUPIRAVIR 200 MG CAPSULE (EUA)
ORAL_CAPSULE | Freq: Two times a day (BID) | ORAL | 0 refills | 5 days | Status: CP
Start: 2022-05-31 — End: 2022-06-05

## 2022-05-31 NOTE — Unmapped (Signed)
Hello Adam Keith,    With [facility] first apt available is 5/7  Pt is currently scheduled w/ Dr Juel Burrow for end of May.    Do you want me to move to 5/7?

## 2022-05-31 NOTE — Unmapped (Signed)
[facility]    Patient Name: Adam Keith  Patient Age: 42 y.o.  Encounter Date: 05/31/2022    Primary Care Provider:  Dierdre Harness, MD    Referring Physician:  Dierdre Harness, MD  [address]  [address]  [place],  [place] [address]    Reason for visit:  Ph+ B-ALL follow up     Assessment:  A 42 y.o. 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 The course was complicated by septic shock, candida krusei fungemia, MRSA bacteremia with septic emboli c/b acute renal failure and respiratory distress requiring dialysis and intubation.  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  He subsequently had difficulty tolerating single agent dasatinib (as a bridge to planned ponatinib-blinatumomab--had fluid retention and pleural effusion).  He then completed 4 cycles of ponatinib-blinatumomab, initiated for treatment of MRD in the setting of poor tolerance of standard therapy.      Patient is currently on ponatinib 30 mg monotherapy (since 01/2021).  When we decreased his ponatinib to 15 mg he had a rise in transcripts. Most recent BCR-ABL is negative.     He's seen a little earlier than scheduled and in conjunction with ICID due to multiple symptoms - chills, body aches, sinus and ear congestion. Cell counts are stable, although hgb is elevated (possibly due to tobacco use). Will await BCR/ABL and infectious workup. Discussed with Dr. [name], ICID who feels the sinus may be harboring a persistent infection. Pt has now sent a messaged to Dr. Gillermina Hu, ENT, to arrange evaluation. I also discussed smoking cessation with Adam Keith who is not ready to quit - feeling that smoking helps him cope. We also discussed following with [name] regarding his significant depression and he reports he is continuing to work with her.    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   - No plan for further IVIG given several reactions   - Rec Flu and Covid vaccines     DRESS: followed by Dr. 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 Now s/p dialysis  - stable Cr appears to have developed CKD due to incompletely healed acute injury.  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 CT chest 02/06/22 with residual subacute thrombus.   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 [name] and Dr.  Verlan Friends and Dr. Maple Mirza following patient.    - long term doxycycline with goal of decolonization  **Hx Candidemia during induction - now s/p Cresemba  - followed by Dr. [name]  **Hx Covid-19   - symptoms now resolved    Symptom management: bowel regimen as listed in medication list, analgesics as listed in medication list or symptoms reviewed and due to acceptable control, no changes made  **Peripheral neuropathy, sensory and motor  - Grade 1-2 monitor  **Chronic Back Pain, complicated by hospital stay and procedures  - Followed by Dr. Genice Rouge, Palliative Care  **Depression/anxiety: met with [name], [facility]  - nortriptyline  - PRN clonazepam  - has not been using PRN hydroxyzine    Venous access: I recommend the following change for venous access : PICC line removal      Supportive Care Recommendations:  We recommend based on the patient???s underlying diagnosis and treatment history the following supportive care:    1. Antimicrobial prophylaxis:    - Viral - valacyclovir 500mg  daily  - Bacterial - levofloxacin 500 mg if ANC <0.5  - Fungal - on treatment isavuconazole  - PJP - Bactrim DS BID Sat/Sun  - recommend Annual flu shot - declines thus far  - recommend updated Covid vaccine - declines    2. Blood product support:  I recommend the following intervals for laboratory monitoring to determine transfusion needs and monitor for hematologic effects of therapy and underlying disease: no routine monitoring outside of clinic follow-up    Leukoreduced blood products are required.  Irradiated blood products are preferred, but in case of urgent transfusion needs non-irradiated blood products may be used:     -  RBC transfusion threshold: transfuse 2 units for Hgb < 8 g/dL.  -  Platelet transfusion threshold: transfuse 1 unit of platelets for platelet count < 10, or for bleeding or need for invasive procedure.    3. Hematopoietic growth factor support: none      Care coordination: The next [facility] Follow up requested:  - 3 month followup      These services include the following elements of medical decision making:  addressing a hematologic cancer that poses a threat to life and/or bone marrow function  interpretation of diagnostic test reports or ordering of diagnostic tests and independent interpretation of diagnostic tests  High risk of morbidity due to drug therapy requiring monitoring for toxicity or decisions regarding goals and continuation of antineoplastic therapy    [name], AGPCNP-BC  Nurse Practitioner  Hematology/Oncology  [facility]    Mariel Aloe, MD was available  Leukemia Program  Division of Hematology  [facility]     Nurse Navigator (non-clinical trial patients): Elicia Lamp, RN        Tel. [phone]       Fax. [phone]  Toll-free appointments: [phone]  Scheduling assistance: [phone]  After hours/weekends: [phone] (ask for adult hematology/oncology on-call)            History of Present Illness:  We had the pleasure of seeing Shamil Bosscher in the Leukemia Clinic at the [facility] on 05/31/2022.  He is a 42 y.o. male with  Ph+ B-ALL .          His oncologic history is as follows:      Hematology/Oncology History Overview Note   Referring/Local Oncologist: None    Diagnosis:Ph+ ALL    Genetics:    Karyotype/FISH:Abnormal Karyotype: 46,XY,t(9;22)(q34;q11.2)[1]/45,XY,der(7;9)(q10;q10)t(9;22)(q34;q11.2),der(22)t(9;22)[11]/46,sdl,+der(22)t(9;22)[5]/46,XY[3]  Abnormal FISH: A BCR/ABL1 interphase FISH assay shows an abnormal signal pattern in 97% of the 100 cells scored. 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  The findings support a diagnosis of leukemia and have implications for targeted therapy and for monitoring residual disease.      Molecular Genetics:  BCR-ABL1 p210 transcripts were detected at a level of 46.479 IS% ratio in bone marrow.  BCR-ABL1 p190 transcripts were detected at a level of 4 in 100,000 cells in bone marrow.    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)     01/24/2020 - 04/02/2020 Chemotherapy    IP/OP LEUKEMIA GRAAPH-2005 + RITUXIMAB < 60 YO  rituximab hypercvad (odd and even course)     02/24/2020 Remission    CR with PCR MRD+; marrow showing 70% blasts with <1% blasts, negative flow MRD, BCR-ABL p210 PCR 0.197%; CNS negative for blasts     03/09/2020 Progression    CSF - rare blast ID'ed - IT chemo given    03/13/20 - IT chemo, CSF negative     04/16/2020 Biopsy    BM Bx with 40-50% cellularity, <1% blasts, MRD flow cytometry negative, BCR-ABL p210 transcripts 0.036%     05/28/2020 - 05/28/2020 Chemotherapy    OP AML - CNS THERAPY (INTRATHECAL CYTARABINE, INTRATHECAL METHOTREXATE, OR INTRATHECAL TRIPLE)  Select one of the following: cytarabine IT 100 mg with hydrocortisone 50 mg, cytarabine IT 40 mg with methotrexate 15 mg with hydrocortisone 50 mg, OR methotrexate IT 12 mg with hydrocortisone 50 mg     06/19/2020 -  Chemotherapy    IP/OP LEUKEMIA BLINATUMOMAB 7-DAY INFUSION (MINIMAL RESIDUAL DISEASE; WT >= 22 KG) (HOME INFUSION)      Cycles 1*-4: Blinatumomab 28 mcg/day Days 1-28 of 6-week cycle.  *Given in the inpatient setting on Days 1-3 on Cycle 1 and Days 1-2 on Cycle 2, while other treatment days are given in the outpatient setting.    Cycle 1 MRD Dosing     07/18/2020 Adverse Reaction    Hospitalization: fevers. Pneumonia     07/23/2020 Biopsy    Diagnosis  Bone marrow, right iliac, aspiration and biopsy  -   Normocellular bone marrow (50%) with trilineage hematopoiesis and 1% blasts by manual aspirate differential  -   Flow cytometry MRD analysis reveals no definitive immunophenotypic evidence of residual B lymphoblastic leukemia   - BCR-ABL p210 0.006%         08/10/2020 -  Chemotherapy    Cycle 2 blinatumomab-ponatinib  Ponatinib 30mg     1 IT per cycle     09/21/2020 Adverse Reaction    Covid-19, symptomatic but not requiring hospitalization.     10/06/2020 -  Chemotherapy    Cycle 3 blinatumomab-ponatinib  Ponatinib 30mg        11/01/2020 Adverse Reaction    Hospitalization: MRSA Pneumonia requiring intubation    Blinatumomab stopped.  Ponatinib held until he stabilized.       12/14/2020 -  Chemotherapy    Cycle 4 blinatumomab 28 mcg/day days 1-28, ponatinib 30 mg per day IT triple therapy day 29     01/13/2021 -  Chemotherapy    Ponatinib 30mg   daily  - due to due low level BCR-ABL     03/06/2021 Adverse Reaction    Hospitalization - ICU on ventilator - influenza and MRSA/klebsiella PNA     06/13/2022 -  Chemotherapy    OP LEUKEMIA VINCRISTINE  vinCRIStine 2 mg IV on day 1     ALL (acute lymphoblastic leukemia) (CMS-HCC)   06/11/2020 -  Chemotherapy    IP/OP LEUKEMIA BLINATUMOMAB 7-DAY INFUSION (MINIMAL RESIDUAL DISEASE; WT >= 22 KG) (HOME INFUSION)  Cycles 1*-4: Blinatumomab 28 mcg/day Days 1-28 of 6-week cycle.  *Given in the inpatient setting on Days 1-3 on Cycle 1 and Days 1-2 on Cycle 2, while other treatment days are given in the outpatient setting.     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  *Given in the inpatient setting on Days 1-3 on Cycle 1 and Days 1-2 on Cycle 2, while other treatment days are given in the outpatient setting.     11/09/2020 Initial Diagnosis    Acute lymphoblastic leukemia in remission (CMS-HCC)     06/13/2022 -  Chemotherapy    OP LEUKEMIA VINCRISTINE  vinCRIStine 2 mg IV on day 1         Interim History:    Since his last visit he was treated in the ED for pneumonia. He reports that he also had an ear infection. He now has decreased hearing in his good ear.  He continues to feel poorly overall  - intermittent chills, cold sweats  - low grade elevated temps - 99's  - persistent sinus congestion  - SOB with walking  - limited taste/appetite  - depressed mood secondary to loss of QoL  - smokes 1.5 packs/day and is not interested in quiting. The one thing I enjoy  - He is following with [name] and Kathlynn Grate      Past Medical, Surgical and Family History were reviewed and pertinent updates were made in the Electronic Medical Record      ECOG Performance Status: 1    Medications:      Current Outpatient Medications   Medication Sig Dispense Refill    albuterol HFA 90 mcg/actuation inhaler Inhale 2 puffs every six (6) hours as needed for wheezing. 18 g 0    amLODIPine (NORVASC) 10 MG tablet Take 1 tablet (10 mg total) by mouth daily. 30 tablet 0    apixaban (ELIQUIS) 5 mg Tab Take 1 tablet (5 mg total) by mouth two (2) times a day. 60 tablet 6    arm brace (WRIST BRACE) Misc 1 Piece by Miscellaneous route as needed. left ulnar wrist brace 1 each 0    azithromycin (ZITHROMAX Z-PAK) 250 MG tablet Take 1 tablet (250 mg total) by mouth daily. 1 by mouth daily for 4 days 4 tablet 0    buprenorphine (BUTRANS) 20 mcg/hour PTWK transdermal patch Place 1 patch on the skin every 5 days. 6 patch 0    carvediloL (COREG) 12.5 MG tablet Take 1 tablet (12.5 mg total) by mouth Two (2) times a day. 180 tablet 1    cyclobenzaprine (FLEXERIL)  10 MG tablet Take 1 tablet (10 mg total) by mouth Three (3) times a day as needed for muscle spasms.      docusate sodium (COLACE) 100 MG capsule Take 1 capsule (100 mg total) by mouth daily.      doxycycline (VIBRA-TABS) 100 MG tablet Take 1 tablet (100 mg total) by mouth two (2) times a day. 180 tablet 3    hydroCHLOROthiazide (HYDRODIURIL) 12.5 MG tablet Take 1 tablet (12.5 mg total) by mouth daily. 30 tablet 11    mirtazapine (REMERON) 15 MG tablet TAKE 1 TABLET BY MOUTH EVERY DAY AT NIGHT 90 tablet 4    molnupiravir, EUA, (LAGEVRIO) 200 mg capsule Take 4 capsules (800 mg total) by mouth every twelve (12) hours for 5 days. 40 capsule 0    nortriptyline (PAMELOR) 50 MG capsule TAKE 2 CAPSULES BY MOUTH NIGHTLY. 60 capsule 11 omeprazole (PRILOSEC) 40 MG capsule Take 1 capsule (40 mg total) by mouth every morning.      ondansetron (ZOFRAN-ODT) 4 MG disintegrating tablet Take 1 tablet (4 mg total) by mouth every eight (8) hours as needed for nausea.      oxyCODONE (ROXICODONE) 5 MG immediate release tablet Take 1-2 tablets (5-10 mg total) by mouth every four (4) hours as needed for pain for up to 14 days. 90 tablet 0    PONATinib (ICLUSIG) 30 mg tablet Take 1 tablet (30 mg total) by mouth daily. Swallow tablets whole. Do not crush, break, cut or chew tablets. 30 tablet 5    spironolactone (ALDACTONE) 25 MG tablet Take 1 tablet (25 mg total) by mouth daily. 90 tablet 3    sulfamethoxazole-trimethoprim (BACTRIM DS) 800-160 mg per tablet TAKE 1 TABLET BY MOUTH 2 TIMES A DAY ON SATURDAY, SUNDAY. FOR PROPHYLAXIS WHILE ON CHEMO. 48 tablet 3    tiotropium-olodaterol (STIOLTO RESPIMAT) 2.5-2.5 mcg/actuation Mist Inhale 2 puffs daily. 4 g 3    valACYclovir (VALTREX) 500 MG tablet Take 1 tablet (500 mg total) by mouth daily. 90 tablet 11     No current facility-administered medications for this visit.       Vital Signs:  There were no vitals filed for this visit.    Vitals reviewed in EMR 05/31/2022 - stable with slightly elevated BP      Relevant Physical Exam Findings:  General: Resting in no apparent distress, accompanied by spouse  HEENT:  Clear sclera, conjunctiva, mask in place  CARDAC: WWP  RESP: nonlabored  GI: non-distended  NEURO: alert, and oriented x4, steady gait, no focal deficits  PSYCH: DEPRESSED  DERM: no visible rashes, lesions    MDM:  1 chronic life threatening illness  Drug therapy requiring intense monitoring for toxicity

## 2022-05-31 NOTE — Unmapped (Signed)
Hello Adam Keith,    Scheduled for 3/20 with oto skull fellow and AOA, pt is aware.

## 2022-05-31 NOTE — Unmapped (Signed)
I will update you once the BCR/ABL result is back.  We will follow the viral and fungal tests and update you about those.  Please keep me up to date about the recommendations from Dr. Gillermina Hu.  If you choose to cut back smoking, I am happy to refer you to the Smoking Cessation specialists.    I'll see you back in 3 months, but please keep me up to date.

## 2022-06-01 NOTE — Unmapped (Signed)
Scheduled for 6/27 with Dr Gillermina Hu, wife is aware

## 2022-06-02 LAB — FUNGITELL ASSAY
FUNGITELL QUALITATIVE: NEGATIVE
FUNGITELL: <31 pg/mL

## 2022-06-03 DIAGNOSIS — C9101 Acute lymphoblastic leukemia, in remission: Principal | ICD-10-CM

## 2022-06-03 NOTE — Unmapped (Signed)
Summary Statement:    This is a 42 year old with a history of acute leukemia, currently being treated with panatinib. The participant reports that since the last call, there have not been unplanned ED visits. The participant reports that since the last call, there were not discussions with the clinical team about treatment planning or about the overall disease course. He has not been meeting or setting goals.       Coaching Call Review 05/31/22  Adam Keith continues to feel poorly and have frustration around his illness and symptoms.  We spent some time reflecting on the things that he is able to do rather than the things he is not able to do. Today???s call was very short due to lack of participant engagement.     Weekly Report for week starting 05/31/2022  Please click below to view a more detailed report on our [employer] website:  weekly report 05/09/2022 * Please hold the shift key when clicking the hyperlink to access your patient???s report   The following symptoms have been flagged with an alert this week 05/31/2022:  No alerts    New SMART Goals:     Movement:   Domain discussed: no    Mood:   Domain discussed: no   No goal    Meaning:   Domain discussed: Yes  By our next call, Adam Keith will complete at least one outstanding workshop projects.       Coaching Call Detailed Notes 05/31/22       Symptoms:   Adam Keith continues to report hearing loss, he was able to make an appointment with the audiologist.  Adam Keith also reported anxiety related to his worsening hearing loss.      Movement:   Movement was not discussed this call.     In the last week Adam Keith completed 0 bouts of intentional exercise.  Adam Keith is frustrated that he cannot do the things that he wants to do because he limited physically.  All of the things that he enjoys doing are physical and he is unable to perform those things due to his fatigue.      He has not been wearing his FitBit.        Mood:   Mood was discussed this call.    In the last week, Adam Keith reports significant frustration with his physical health.  He is disheartened by his pain management plan and his inability to do the things he wants to do.  We spent some time talking about how a few hours in the woodshop is an improvement from no hours in the woodshop.  We also recognized that listening to his body and resting when he needs to is a new skill that has developed over the course of the last 5 months.  He is frustrated by these things, but he did acknowledge that he is resting more and that he feels better when he rests.     He reported that he isn???t sure if he will able to continue working. He isn't sure how much longer he will be able to work because of his health.   He expressed concern about not having an income, but had no other ideas about income generation if he left his job.    Adam Keith mentioned that his anxiety is very high due to his hearing loss and not being able to be aware of his surroundings.     Meaning:   Meaning and meaning-making were discussed during this call.  Adam Keith is eager  to finish his workshop projects since he has the entire week off of work and the weather is improving.  He is hopeful that he will be able to spend significant amounts of time in the shop.  His main barrier is how he feels and his stamina, he was not open to brainstorming ways to improve these things.     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  He is afraid of the ???what if??? scenarios. He has really creative and good ideas about how to care for himself and cultivate happiness, but isn???t open to setting goals to actualizing those ideas.        Next call: March 5, 12pm

## 2022-06-05 DIAGNOSIS — C9101 Acute lymphoblastic leukemia, in remission: Principal | ICD-10-CM

## 2022-06-05 MED ORDER — ICLUSIG 30 MG TABLET
ORAL_TABLET | Freq: Every day | ORAL | 5 refills | 30 days
Start: 2022-06-05 — End: ?

## 2022-06-06 ENCOUNTER — Telehealth: Admit: 2022-06-06 | Discharge: 2022-06-07 | Payer: MEDICAID

## 2022-06-06 DIAGNOSIS — F4310 Post-traumatic stress disorder, unspecified: Principal | ICD-10-CM

## 2022-06-06 DIAGNOSIS — F331 Major depressive disorder, recurrent, moderate: Principal | ICD-10-CM

## 2022-06-06 MED ORDER — MIRTAZAPINE 30 MG TABLET
ORAL_TABLET | Freq: Every evening | ORAL | 3 refills | 90 days | Status: CP
Start: 2022-06-06 — End: 2023-06-01

## 2022-06-06 NOTE — Unmapped (Signed)
Comprehensive Cancer Support Program (CCSP) - Psychiatry Outpatient Clinic   After Visit Summary    It was a pleasure to see you today in the [facility] Comprehensive Cancer Support Program ([facility]). The CCSP is a multidisciplinary program dedicated to helping patients, caregivers, and families with cancer treatment, recovery and survivorship.      To schedule, cancel, or change your appointment:  Please call the [facility] schedulers at [phone], Monday through Friday 8AM - 5PM.  Someone will return your call within 24 hours.      If you have a question about your medicines or you need to contact your provider:  First, try sending a My Chart message to Cleopha Indelicato, PMHNP. If you are unable to do so, please call the CCSP program coordinator, Devon Suitt, at [phone].     For after hours urgent issues, you may call [phone] or call the "I need to talk" line at 1-800-273-TALK (8255) anytime 24/7.    CCSP Patient and Family Resource Center: [phone].    CCSP Website:  [email or web address]    For prescription refills, please allow at least 24 hours (during business hours, M-F) for providers to call in refills to your pharmacy. We are generally unable to accommodate same-day requests for refills.     If you are taking any controlled substances (such as anxiety or sleep medications), you must use them as the directions say to use them. We generally do not provide early refills over the phone without clear reason, and it would be inappropriate to obtain the medications from other doctors. We routinely use the [place] controlled substance database to monitor prescription drug use.

## 2022-06-06 NOTE — Unmapped (Signed)
[facility]  Psychiatry--Comprehensive Cancer Support Program   Established Patient E&M Service - Outpatient       Assessment:    Adam Keith presents for follow-up evaluation. Appointment requested due to worsening depression. Palliative care had increased his mirtazapine from 15 mg to 30 mg at bedtime back on 05/27/22. He states that he had not realized when he met with palliative care on 2/23 that he had stopped his mirtazapine, but he went ahead and re-started it at 30 mg. Today he states that his mood is improving with the mirtazapine 30 mg along with his nortriptyline 100 mg at bedtime. No medication changes needed today. Will reassess on 09/05/22.    Identifying Information:  Adam Keith is a 42 y.o. male with a history of PH+ B cell ALL, recurrent major depressive disorder, PTSD, anxiety, and insomnia. He has had a long struggle with intermittent depressive episodes with one suicide attempt at age 34.      Risk Assessment:  An assessment of suicide and violence risk factors was performed as part of this evaluation and is not significantly increased from the last visit.   While future psychiatric events cannot be accurately predicted, the patient does not currently require acute inpatient psychiatric care and does not currently meet [facility] involuntary commitment criteria.      Plan:    Problem 1: Major Depressive Disorder  Status of problem: chronic with mild to moderate exacerbation  Interventions:   Continue nortriptyline 100 mg at bedtime.    Continue mirtazapine 30 mg at bedtime   Encouraged psychotherapy with local therapist     Problem 2: Anxiety/PTSD  Status of problem: chronic with mild exacerbation  Interventions:   See MDD above     Problem 3: Insomnia  Status of problem:  chronic and stable  Interventions:   See mirtazapine plan above      Psychotherapy provided:  No billable psychotherapy service provided.    Patient has been given this writer's contact information as well as the [facility] Psychiatry urgent line number. The patient has been instructed to call [phone] for emergencies.      Subjective:    Chief complaint:  Follow-up psychiatric evaluation for depression and anxiety    Interval History:   Palliative care had recommended that he increase his mirtazapine from 15 mg to 30 mg due to worsening depression back on 05/27/22. Today he states that he had not realized that he had stopped his mirtazapine, but he went ahead and re-started the mirtazapine at 30 mg.     Today, he states that he his depression is improving despite still feeling physically ill with N/V and fatigue. He is endorsing mild to moderate depression, anhedonia, decreased motivation, low self-esteem, ruminations, and mild intermittent anxiety. He states that he is sleeping well each night and averaging 6 to 8 hours of sleep per night.     Prior Psychiatric Medication Trials: bupropion (could not tolerate), duloxetine, mirtazapine (given during recent hospitalization), lorazepam, clonazepam      Psychiatric Review of Systems  Depressive Symptoms: depressed mood improving (mild to moderate in severity), anhedonia, decreased motivation, feelings of worthlessness, fatigue  Manic Symptoms: N/A  Psychosis Symptoms: N/A  Anxiety Symptoms: intermittent mild anxiety, ruminations, restlessness at times, easily fatigued, muscle tension  Panic Symptoms: denies  PTSD Symptoms: denies  Sleep disturbance: denies  Exercise: Working with [employer]      Objective:      Mental Status Exam:  Appearance:  Appears stated age and Well nourished   Motor:   No abnormal movements   Speech/Language:    Normal rate, volume, tone, fluency   Mood:    OK   Affect:   Calm, Cooperative, and Euthymic   Thought process and Associations:   Logical, linear, clear, coherent, goal directed   Abnormal/psychotic thought content:     Denies SI, HI, self harm, delusions, obsessions, paranoid ideation, or ideas of reference   Perceptual disturbances:     Denies auditory and visual hallucinations, behavior not concerning for response to internal stimuli     Other:   Insight and judgment intact     I personally spent 30 minutes face-to-face and non-face-to-face in the care of this patient, which includes all pre, intra, and post visit time on the date of service.    Visit was completed by video (or phone) and the appropriate disclaimer has been included below.    The patient reports they are physically located in [place] and is currently: at home. I conducted a audio/video visit. I spent 15 minutes on the video call with the patient. I spent an additional 15 minutes on pre- and post-visit activities on the date of service .       Sheran Lawless, PMHNP  06/06/2022

## 2022-06-14 ENCOUNTER — Ambulatory Visit: Admit: 2022-06-14 | Discharge: 2022-06-14 | Payer: MEDICAID

## 2022-06-14 NOTE — Unmapped (Signed)
[facility] Refill Coordination Note    Specialty Medication(s) to be Shipped:   Hematology/Oncology: Iclusig    Other medication(s) to be shipped: No additional medications requested for fill at this time     Adam Keith, DOB: May 27, 1980  Phone: [phone] (home) [phone] (work)      All above HIPAA information was verified with patient's family member, Spouse.     Was a Nurse, learning disability used for this call? No    Completed refill call assessment today to schedule patient's medication shipment from the [facility] [phone]).  All relevant notes have been reviewed.     Specialty medication(s) and dose(s) confirmed: Regimen is correct and unchanged.   Changes to medications: Adam Keith reports no changes at this time.  Changes to insurance: No  New side effects reported not previously addressed with a pharmacist or physician: None reported  Questions for the pharmacist: No    Confirmed patient received a Conservation officer, historic buildings and a Surveyor, mining with first shipment. The patient will receive a drug information handout for each medication shipped and additional FDA Medication Guides as required.       DISEASE/MEDICATION-SPECIFIC INFORMATION        N/A    SPECIALTY MEDICATION ADHERENCE     Medication Adherence    Patient reported X missed doses in the last month: 0  Specialty Medication: Iclusig 30 mg  Patient is on additional specialty medications: No  Informant: patient     Were doses missed due to medication being on hold? No    Iclusig 30 mg: 14 days of medicine on hand       REFERRAL TO PHARMACIST     Referral to the pharmacist: Not needed      [facility]     Shipping address confirmed in Epic.     Delivery Scheduled: Yes, Expected medication delivery date: 06/23/22.  However, Rx request for refills was sent to the provider as there are none remaining.     Medication will be delivered via UPS to the prescription address in [employer] [place].    Adam Keith Adam Keith   [facility] Specialty Technician

## 2022-06-14 NOTE — Unmapped (Signed)
Otolaryngology New Clinic Visit Note    Reason for Consultation:   The patient is being seen in consultation at the request of Referred Self for the evaluation of hearing loss.    History of Present Illness:   The patient is a 42 y.o. male with a past medical history of ALL on ponatinib who presents for the evaluation of hearing loss.    The patient has a prior history of severe right sided hearing loss related to medications (furosemide) who presents with a complaint of sudden hearing loss on the left.  This occurred about 1 and 1/2 months ago.  He had a preceding earache.  No otorrhea.  He has intermittent ear popping on the left and he notes that that prior to his hearing loss he had fluctuating hearing with ear popping.  This that he woke up 1 morning and the ear pain was gone, but the hearing on the left was also gone.  He denies dizziness or vertigo.  He has no rhinologic complaints today    His last audio Ram was from August of last year.  He saw Dr. Bradd Burner at that time.  They had discussed options for hearing rehabilitation.    The patient denies fevers, chills, shortness of breath, chest pain, nausea, vomiting, diarrhea, inability to lie flat, dysphagia, odynophagia, hemoptysis, hematemesis, changes in vision, changes in voice quality, otalgia, otorrhea, vertiginous symptoms, focal deficits, or other concerning symptoms.    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    Septic shock due to Staphylococcus aureus (CMS-HCC) 01/28/2020    Tear of medial meniscus of knee 10/25/2016    Testicular pain, left 01/21/2022       Past Surgical History:  Past Surgical History:   Procedure Laterality Date    BONE MARROW BIOPSY & ASPIRATION  01/21/2020         CHG [place], CHEST,REAL TIME 11/20/2020    Procedure: ULTRASOUND, CHEST, REAL TIME WITH IMAGE DOCUMENTATION;  Surgeon: Jerelyn Charles, MD;  Location: BRONCH PROCEDURE LAB [facility];  Service: Pulmonary    IR INSERT PORT AGE GREATER THAN 5 YRS  03/10/2020    IR INSERT PORT AGE GREATER THAN 5 YRS 03/10/2020 [name], MD IMG VIR H&V [facility]    PR BRONCHOSCOPY,DIAGNOSTIC W LAVAGE Bilateral 07/20/2020    Procedure: BRONCHOSCOPY, RIGID OR FLEXIBLE, INCLUDE FLUOROSCOPIC GUIDANCE WHEN PERFORMED; W/BRONCHIAL ALVEOLAR LAVAGE WITH MODERATE SEDATION;  Surgeon: Dellis Filbert, MD;  Location: BRONCH PROCEDURE LAB [facility];  Service: Pulmonary       Current Medications:  Current Outpatient Medications   Medication Sig Dispense Refill    albuterol HFA 90 mcg/actuation inhaler Inhale 2 puffs every six (6) hours as needed for wheezing. 18 g 0    amLODIPine (NORVASC) 10 MG tablet Take 1 tablet (10 mg total) by mouth daily. 30 tablet 0    apixaban (ELIQUIS) 5 mg Tab Take 1 tablet (5 mg total) by mouth two (2) times a day. 60 tablet 6    arm brace (WRIST BRACE) Misc 1 Piece by Miscellaneous route as needed. left ulnar wrist brace 1 each 0    azithromycin (ZITHROMAX Z-PAK) 250 MG tablet Take 1 tablet (250 mg total) by mouth daily. 1 by mouth daily for  4 days 4 tablet 0    carvediloL (COREG) 12.5 MG tablet Take 1 tablet (12.5 mg total) by mouth Two (2) times a day. 180 tablet 1    cyclobenzaprine (FLEXERIL) 10 MG tablet Take 1 tablet (10 mg total) by mouth Three (3) times a day as needed for muscle spasms.      docusate sodium (COLACE) 100 MG capsule Take 1 capsule (100 mg total) by mouth daily.      doxycycline (VIBRA-TABS) 100 MG tablet Take 1 tablet (100 mg total) by mouth two (2) times a day. 180 tablet 3    hydroCHLOROthiazide (HYDRODIURIL) 12.5 MG tablet Take 1 tablet (12.5 mg total) by mouth daily. 30 tablet 11    mirtazapine (REMERON) 30 MG tablet Take 1 tablet (30 mg total) by mouth nightly. 90 tablet 3    nortriptyline (PAMELOR) 50 MG capsule TAKE 2 CAPSULES BY MOUTH NIGHTLY. 60 capsule 11    omeprazole (PRILOSEC) 40 MG capsule Take 1 capsule (40 mg total) by mouth every morning.      ondansetron (ZOFRAN-ODT) 4 MG disintegrating tablet Take 1 tablet (4 mg total) by mouth every eight (8) hours as needed for nausea.      PONATinib (ICLUSIG) 30 mg tablet Take 1 tablet (30 mg total) by mouth daily. Swallow tablets whole. Do not crush, break, cut or chew tablets. 30 tablet 5    spironolactone (ALDACTONE) 25 MG tablet Take 1 tablet (25 mg total) by mouth daily. 90 tablet 3    sulfamethoxazole-trimethoprim (BACTRIM DS) 800-160 mg per tablet TAKE 1 TABLET BY MOUTH 2 TIMES A DAY ON SATURDAY, SUNDAY. FOR PROPHYLAXIS WHILE ON CHEMO. 48 tablet 3    tiotropium-olodaterol (STIOLTO RESPIMAT) 2.5-2.5 mcg/actuation Mist Inhale 2 puffs daily. 4 g 3    valACYclovir (VALTREX) 500 MG tablet Take 1 tablet (500 mg total) by mouth daily. 90 tablet 11     No current facility-administered medications for this visit.       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    Onion Anaphylaxis    Vancomycin Analogues Other (See Comments)     Hearing loss with Lasix    Other reaction(s): Other (See Comments)    Hearing loss    lasix    Hearing loss      lasix      Hearing loss with Lasix Other reaction(s): Other (See Comments) Hearing loss lasix    Bismuth Subsalicylate Nausea And Vomiting    Privigen [Immun Glob G(Igg)-Pro-Iga 0-50] Other (See Comments)     12 /10/2020 VIG infusion stopped as patient developed rigors, tachycardia and HTN during infusion despite pre-medication; 03/12/21 completed IVIG with premedications and slower rate of infusion    Furosemide Other (See Comments)     With Vancomycin caused hearing loss    Other reaction(s): Other (See Comments)    With Vancomycin caused hearing loss      With Vancomycin caused hearing loss Other reaction(s): Other (See Comments) With Vancomycin caused hearing loss    Gammagard        Family History:  Family History   Problem Relation  Age of Onset    Cancer Mother     No Known Problems Father     No Known Problems Sister     No Known Problems Brother     No Known Problems Maternal Aunt     No Known Problems Maternal Uncle     No Known Problems Paternal Aunt     No Known Problems Paternal Uncle     No Known Problems Maternal Grandmother     Cancer Maternal Grandfather     No Known Problems Paternal Grandmother     No Known Problems Paternal Grandfather     Melanoma Neg Hx     Basal cell carcinoma Neg Hx     Squamous cell carcinoma Neg Hx        Social History:  Social History     Tobacco Use   Smoking Status Every Day    Current packs/day: 2.00    Types: Cigarettes    Passive exposure: Current   Smokeless Tobacco Former    Types: Chew     Social History     Substance and Sexual Activity   Alcohol Use Not Currently    Comment: Rarely     Social History     Substance and Sexual Activity   Drug Use Never       Review of Systems:  A 12 system review of systems was performed and is negative other than that noted in the history of present illness.    Vital Signs:  Blood pressure 143/90, pulse 83, temperature 36.5 ??C (97.7 ??F), weight (!) 130.2 kg (287 lb).    Physical Exam:  General: Well-developed, well-nourished. Appropriate, comfortable, and in no apparent distress.  Head/Face: On external examination there is no obvious asymmetry or scars. On palpation there is no tenderness over maxillary sinuses or masses within the salivary glands. Cranial nerves V and VII are intact through all distributions.  Eyes: PERRL, EOMI, the conjunctiva are not injected and sclera is non-icteric.  Ears: On external exam, there is no obvious lesions or asymmetry. The EACs are bilaterally with minimal cerumen and no lesions. The TMs are in the neutral position.There are no middle ear masses or fluid noted.   Nose: External examination of the nose reveals no masses, lesions, or deformity. Anterior rhinoscopy reveals moist mucosa with no purulence, polyposis, or polypoid edema.  Oral cavity/oropharynx: The mucosa of the lips, gums, hard and soft palate, posterior pharyngeal wall, tongue, floor of mouth, and buccal region are without masses or lesions and are normally hydrated. Good dentition. Tongue protrudes midline. Tonsils are normal appearing. Supraglottis not visualized due to gag reflex.  Neck: There is no asymmetry or masses. Trachea is midline. There is no enlargement of the thyroid or palpable thyroid nodules.   Lymphatics: There is no palpable lymphadenopathy along the jugulodiagastric, submental, or posterior cervical chains.  Neurologic: Cranial nerve???s II-XII are grossly intact. Exam is non-focal.  Extremities: No cyanosis, clubbing or edema.      Labs and Diagnostic Tests:  Results for orders placed or performed in visit on 05/31/22   Sinus Culture    Specimen: Sinus, Maxillary   Result Value Ref Range    Sinus Culture OROPHARYNGEAL FLORA ISOLATED     Gram Stain Result No polymorphonuclear leukocytes seen     Gram Stain Result No organisms seen    Respiratory Pathogen Panel   Result Value Ref Range    Adenovirus Not Detected Not Detected    Coronavirus HKU1 Not Detected  Not Detected    Coronavirus NL63 Not Detected Not Detected    Coronavirus 229E Not Detected Not Detected    Coronavirus OC43 PCR Not Detected Not Detected    Metapneumovirus Not Detected Not Detected    Rhinovirus/Enterovirus Not Detected Not Detected    Influenza A Not Detected Not Detected    Influenza B Not Detected Not Detected    Parainfluenza 1 Not Detected Not Detected    Parainfluenza 2 Not Detected Not Detected    Parainfluenza 3 Not Detected Not Detected    Parainfluenza 4 Not Detected Not Detected RSV Not Detected Not Detected    Bordetella pertussis Not Detected Not Detected    Bordetella parapertussis Not Detected Not Detected    Chlamydophila (Chlamydia) pneumoniae Not Detected Not Detected    Mycoplasma pneumoniae Not Detected Not Detected    SARS-CoV-2 PCR Detected (A) Not Detected     *Note: Due to a large number of results and/or encounters for the requested time period, some results have not been displayed. A complete set of results can be found in Results Review.     Audio today      10/28/21      Assessment/Recommendations:  The patient is a 42 y.o. male with a past medical history as stated above who presents for the evaluation of sudden sensorineural hearing loss.  Although this was about a month and a half and ago, we discussed the risks, benefits, and alternatives of an oral steroid taper, particularly this long after his hearing loss.  He states that he had sudden hearing loss last year and that the steroid taper helped even though it was delayed as well.     We will attempt to obtain an audiogram today, but as it is late in the day we will obtain an audiogram as soon as possible this week.    Will send in a steroid taper, refilled in a similar dosing fashion from Dr. Wynell Balloon last visit.    We will get him back into see Dr. Melton Krebs as they had planned a yearly visit in August, but may need to be seen sooner. He is interested in talking about hearing aids, but was under the impression that he needed to see Dr. Melton Krebs prior to that discussion with audiology. I will send a message to Dr. Melton Krebs.     The patient's physical examination findings were thoroughly discussed.    The patient voiced complete understanding of the plan as detailed above and is in full agreement.    [name], MD  Clinical Fellow - Rhinology/Anterior Skull Base Surgery  [facility] - Department of Otorhinolaryngology - Head and Neck Surgery    ATTENDING ATTESTATION:  I discussed the findings, assessment and plan with the Fellow and agree with the Fellow's findings and plan as documented in the Fellow's note. I was immediately available.  Oris Drone Harvie Heck, MD

## 2022-06-15 ENCOUNTER — Ambulatory Visit
Admit: 2022-06-15 | Discharge: 2022-06-16 | Payer: MEDICAID | Attending: Student in an Organized Health Care Education/Training Program | Primary: Student in an Organized Health Care Education/Training Program

## 2022-06-15 ENCOUNTER — Institutional Professional Consult (permissible substitution): Admit: 2022-06-15 | Discharge: 2022-06-16 | Payer: MEDICAID | Attending: Audiologist | Primary: Audiologist

## 2022-06-15 MED ORDER — PREDNISONE 10 MG TABLET
ORAL_TABLET | ORAL | 0 refills | 12 days | Status: CP
Start: 2022-06-15 — End: 2022-06-15

## 2022-06-15 NOTE — Unmapped (Signed)
[facility]  Audiology at [facility]    AUDIOLOGIC EVALUATION REPORT     Patient: [name], [name]  MRN: [id number]  DOB: 22-Mar-1981  DATE OF EVALUATION: 06/15/2022    [place] [employer]: Please see Audiogram with full report under MEDIA tab    HISTORY     Adam Keith is a 42 y.o. individual seen by Audiology for a hearing evaluation as part of [name], MD's ENT clinic. 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 Today, patient reports onset of decreased hearing in the left ear following an ear infection approximately 1-2 months ago. He reports constant tinnitus in the right ear. Patient denies any other otologic changes.    RESULTS     Otoscopy revealed:  Right Ear: clear external auditory canal  Left Ear: clear external auditory canal    Tympanometry using a 226 Hz probe tone was consistent with:  Right Ear: Type B tympanogram, consistent with abnormal middle ear function  Left Ear: Type B tympanogram, consistent with abnormal middle ear function    Today's behavioral evaluation was completed using conventional audiometry via insert earphones with good reliability.   Right Ear: Profound SNHL (potential masking dilemma due to contralateral conductive component)   Speech Recognition Threshold (SRT): NR @ 105 dB HL  Word Recognition Testing: 0% at 105 dB HL using recorded NU-6 word list  Left Ear: Moderate to moderately-severe mixed hearing loss  Speech Recognition Threshold (SRT): 50 dB HL  Word Recognition Testing: 100% at 90 dB HL using recorded NU-6 word list    Patient was counseled on today's results and expressed understanding.    RECOMMENDATIONS     ENT - Seeing Dr. Ronnald Ramp today  Re-evaluate hearing per ENT request, sooner should concerns arise    Annabell Howells, Au.D., CCC-A  Pediatric Audiologist  [facility] at [facility]  Scheduling: [phone]    Provider and student wore appropriate PPE for the entirety of today's appointment. Blenda Mounts, B.S., [employer] Doctoral Student, participated in this patient's visit.     Access your Audiogram via MyChart:  - Log into myChart: Menu > Document Center > Medical Record Requests  - Click the hyperlink 'Request Medical Records'   - Fill out fields + Select Audiograms option under 'Specific Diagnostic Images' > Submit    Charges associated with this visit:  HC TYMPANOMETRY  HC COMP AUDIO EVAL & SPEECH RECOG

## 2022-06-16 MED ORDER — PONATINIB 30 MG TABLET
ORAL_TABLET | Freq: Every day | ORAL | 5 refills | 30 days | Status: CP
Start: 2022-06-16 — End: ?
  Filled 2022-06-22: qty 30, 30d supply, fill #0

## 2022-06-17 MED ORDER — NORTRIPTYLINE 50 MG CAPSULE
ORAL_CAPSULE | 4 refills | 0 days | Status: CP
Start: 2022-06-17 — End: ?

## 2022-06-17 NOTE — Unmapped (Signed)
Summary Statement:    This is a 42 year old with a history of acute leukemia, currently being treated with panatinib. The participant reports that since the last call, there have not been unplanned ED visits. The participant reports that since the last call, there were not discussions with the clinical team about treatment planning or about the overall disease course. He has not been meeting or setting goals.       Coaching Call Review 06/14/22  We missed last week???s call due to illness.  Adam Keith struggled with persistent nausea and vomiting last week, and reported today that he is overall better, but still sometimes feeling poorly. Adam Keith was driving to [facility] for an appointment during our call, he wasn???t feeling like the appointment was necessary, but he was still going. Today???s call was very short due to lack of participant engagement.  I provided a reminder to complete his survey.     Weekly Report for week starting 06/14/2022  Please click below to view a more detailed report on our Health[employer] website:  weekly report 05/09/2022 * Please hold the shift key when clicking the hyperlink to access your patient???s report     The following symptoms have been flagged with an alert this week 06/14/2022:  No alerts    New SMART Goals:     Movement:   Domain discussed: no    Mood:   Domain discussed: no   No goal    Meaning:   Domain discussed: Yes  By our next call, Adam Keith will have spent time daily working outside cleaning up around his house and figuring out what needs to be done and how much it will cost.       Coaching Call Detailed Notes 06/14/22       Symptoms:   Adam Keith reporting that his nausea/vomiting symptoms are mostly resolved.  He did not report any other symptoms.      Movement:   Movement was not discussed this call.     In the last week [name] completed 0 bouts of intentional exercise.  Adam Keith is frustrated that he cannot do the things that he wants to do because he limited physically.  All of the things that he enjoys doing are physical and he is unable to perform those things due to his fatigue.      He has not been wearing his FitBit.        Mood:   Mood was not discussed this call.    Meaning:   Meaning was discussed during this call.  Adam Keith reported disappointment that he has not been able to be in the woodshop for the past two weeks due to illness.  He is eager to be outside now that the weather is getting warmer.  He was hopeful that he would be able to spend time outside daily working on things around the outside of his house.  His primary barrier to these projects is money.  He felt like he would be able to work on his woodworking projects while doing things outside.     His primary barriers to completing his outside work is feeling poorly and being tired.  He feels like these are things that are outside of his control and becomes de-motivated to restart when he takes breaks.   For that reason, he plans to just ???keep going??? when doing his outdoor work.      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  He has really creative and good ideas about how to care for himself and cultivate happiness, but isn???t open to setting goals to actualizing those ideas.        Next call: March 20, 11 am

## 2022-06-22 NOTE — Unmapped (Signed)
Adam Keith is seen in consultation at the request of Dr. Gillermina Hu for the evaluation of left-sided hearing loss.    Dear Dr. Gillermina Hu,    Thank you for referring Adam Keith; as you know he is a 42 y.o. male who presents for the evaluation of left-sided hearing loss. His medical history is significant for sudden right hearing loss 1 year ago, attributed to medication. He was admitted to the ED 10/18/2021 for a Covid infection. Today he reports that he doesn't hear anything out of the right side. He was put on antibiotics during his recent hospitalization and felt like this was giving him hearing loss similar to when he lost hearing in his right ear. He has taken steroids before, but is not currently on any. He is unsure if he can take steroids with his current medication regimen. He has tried a right-sided hearing aid previously without relief. He reports a history of ear issues as a child.    INTERVAL 12/30/2021: The patient returns for follow-up. He reports improved hearing on the left and denies any changes on the right side. He reports taking the steroids I prescribed him in the interval. He notes having difficulty pinpointing sounds in a crowd.     INTERVAL 06/23/2022: The patient returns for a follow-up visit. 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 The ear infection made him hypersensitive to sounds. He used ear drops which resolved the pain but resulted in his third episode of sudden left-sided hearing loss. He has difficulty talking on the phone, working, and hearing the television. He is 6 days into a 3-week Prednisone taper which has improved his hearing by 20%. He would like a hearing aid prescription in case his hearing drops again. He also notes any liquid in his ear (drops, water in shower) hurt his ears. He reports infections from implants (MRSA). Doesn't like using nasal sprays.     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    Past Surgical History  His  has a past surgical history that includes Bone Marrow Biopsy & Aspiration (01/21/2020); IR Insert Port Age Greater Than 5 Years (03/10/2020); pr bronchoscopy,diagnostic w lavage (Bilateral, 07/20/2020); and chg [place], chest,real time (11/20/2020).    Past Family History  His family history includes Cancer in his maternal grandfather and mother; No Known Problems in his brother, father, maternal aunt, maternal grandmother, maternal uncle, paternal aunt, paternal grandfather, paternal grandmother, paternal uncle, and sister.    Past Social History  He  reports that he has been smoking cigarettes. He has been exposed to tobacco smoke. He has quit using smokeless tobacco.  His smokeless tobacco use included chew. He reports that he does not currently use alcohol. He reports that he does not use drugs.    Medications/Allergies  His current medication(s) include:    Current Outpatient Medications:     albuterol HFA 90 mcg/actuation inhaler, Inhale 2 puffs every six (6) hours as needed for wheezing. (Patient taking differently: Inhale 2 puffs every six (6) hours as needed for wheezing. prn), Disp: 18 g, Rfl: 0    amLODIPine (NORVASC) 10 MG tablet, Take 1 tablet (10 mg total) by mouth  daily., Disp: 30 tablet, Rfl: 0    apixaban (ELIQUIS) 5 mg Tab, Take 1 tablet (5 mg total) by mouth two (2) times a day., Disp: 60 tablet, Rfl: 6    carvediloL (COREG) 12.5 MG tablet, Take 1 tablet (12.5 mg total) by mouth Two (2) times a day., Disp: 180 tablet, Rfl: 1    cyclobenzaprine (FLEXERIL) 10 MG tablet, Take 1 tablet (10 mg total) by mouth Three (3) times a day as needed for muscle spasms., Disp: , Rfl:     mirtazapine (REMERON) 30 MG tablet, Take 1 tablet (30 mg total) by mouth nightly., Disp: 90 tablet, Rfl: 3    nortriptyline (PAMELOR) 50 MG capsule, TAKE 2 CAPSULES BY MOUTH EVERY DAY AT NIGHT, Disp: 180 capsule, Rfl: 4    omeprazole (PRILOSEC) 40 MG capsule, Take 1 capsule (40 mg total) by mouth every morning., Disp: , Rfl:     ondansetron (ZOFRAN-ODT) 4 MG disintegrating tablet, Take 1 tablet (4 mg total) by mouth every eight (8) hours as needed for nausea., Disp: , Rfl:     PONATinib (ICLUSIG) 30 mg tablet, Take 1 tablet (30 mg total) by mouth daily. Swallow tablets whole. Do not crush, break, cut or chew tablets., Disp: 30 tablet, Rfl: 5    predniSONE (DELTASONE) 10 MG tablet, Take 6 tablets (60 mg total) by mouth daily for 7 days, THEN 5 tablets (50 mg total) daily for 1 day, THEN 4 tablets (40 mg total) daily for 1 day, THEN 3 tablets (30 mg total) daily for 1 day, THEN 2 tablets (20 mg total) daily for 1 day, THEN 1 tablet (10 mg total) daily for 1 day., Disp: 57 tablet, Rfl: 0    spironolactone (ALDACTONE) 25 MG tablet, Take 1 tablet (25 mg total) by mouth daily., Disp: 90 tablet, Rfl: 3    sulfamethoxazole-trimethoprim (BACTRIM DS) 800-160 mg per tablet, TAKE 1 TABLET BY MOUTH 2 TIMES A DAY ON SATURDAY, SUNDAY. FOR PROPHYLAXIS WHILE ON CHEMO., Disp: 48 tablet, Rfl: 3    tiotropium-olodaterol (STIOLTO RESPIMAT) 2.5-2.5 mcg/actuation Mist, Inhale 2 puffs daily., Disp: 4 g, Rfl: 3    valACYclovir (VALTREX) 500 MG tablet, Take 1 tablet (500 mg total) by mouth daily., Disp: 90 tablet, Rfl: 11    arm brace (WRIST BRACE) Misc, 1 Piece by Miscellaneous route as needed. left ulnar wrist brace, Disp: 1 each, Rfl: 0    azithromycin (ZITHROMAX Z-PAK) 250 MG tablet, Take 1 tablet (250 mg total) by mouth daily. 1 by mouth daily for 4 days (Patient not taking: Reported on 06/23/2022), Disp: 4 tablet, Rfl: 0    docusate sodium (COLACE) 100 MG capsule, Take 1 capsule (100 mg total) by mouth daily., Disp: , Rfl:     doxycycline (VIBRA-TABS) 100 MG tablet, Take 1 tablet (100 mg total) by mouth two (2) times a day., Disp: 180 tablet, Rfl: 3    hydroCHLOROthiazide (HYDRODIURIL) 12.5 MG tablet, Take 1 tablet (12.5 mg total) by mouth daily., Disp: 30 tablet, Rfl: 11  Allergies: Bupropion hcl, Cefepime, Ceftaroline fosamil, Dapsone, Onion, Vancomycin analogues, Bismuth subsalicylate, Privigen [immun glob g(igg)-pro-iga 0-50], Furosemide, and Gammagard,    Review of systems   Review of systems was reviewed on attached notes/patient intake forms.      Physical Examination  Temp 36.6 ??C (97.9 ??F)  - Ht 188 cm (6' 2)  - Wt (!) 133.4 kg (294 lb)  - BMI 37.75 kg/m??     General: well appearing, stated age, no distress  Communicates age appropriate, responds to speech well  Head - atraumatic, normocephalic   Face - no surface abnormalities, no tenderness over sinuses   Eyes - no conjunctivitis, no scleritis/iritis, EOM full   Nose - external nose without deformity, anterior rhinoscopy - turbinates, mucosa normal   Oral Cavity/Oropharynx/Lips:  Normal mucous membranes, normal floor of mouth/tongue/OP, no masses or lesions are noted.  Salivary Glands - no mass or asymmetry, no tenderness   Neck - no palpable masses/adenopathy, no thyromegaly   Lymphatic - no neck lymphadenopathy, no lymphedema   Cardiovascular - regular heart rate, no clubbing/cyanosis/edema in hands  Respiratory - breathing comfortably, no audible wheezing or stridor  Psychiatric- appropriate mood and affect  Neurologic - cranial nerves 2-12 intact  Facial Strength - HB 1/6 bilaterally    Ears - External ear- normal, no lesions, no malformations   Otoscopy - Right EAC clear, TM intact, clear middle ear space. Left EAC with impacted cerumen, removed, revealing intact TM and clear middle ear space.     Procedure: cerumen removal AS  Preop Dx: cerumen  Anesthesia: none  Description: cerumen removed from affected ear(s) using microinstruments and binocular microscopy      Audiogram  The audiogram (06/15/2022) was personally reviewed and interpreted. 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 WRS of 0% at 105 dB on right and 100% at 90 dB on left.              (12/30/2021)      Prior:        Tympanogram  The tympanogram was personally reviewed and interpreted. This demonstrates Type B bilaterally.     Imaging  I personally reviewed and interpreted the patients imaging studies. 10/11/2021 CT Sinus did not reveal significant effusion within the middle ear.     I reviewed outside medical records.    Assessment and Plan  The patient is a 42 y.o. male with bilateral hearing loss. 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 He can continue taking Prednisone as prescribed. We discussed the risks and benefits of BiCROS hearing aids versus cochlear implantation. For his left-sided hearing loss we discussed tympanostomy. Risks and benefits were discussed. This could be helpful since I suspect Eustachian tube dysfunction is playing a role in his recurrent symptoms. He will consider his options and contact me if he wishes to proceed with surgery. I will set him up for a BiCROS hearing aid evaluation. I also recommend applying 1-2 drops of mineral oil to his ears 3-4 times per week to manage cerumen buildup. He will begin using Flonase daily for aural fullness.     I will follow-up in 2-3 months with repeat audiogram. He can contact me with any questions or concerns.    I appreciate the opportunity to participate in his care.    This record has been created using voice recognition software and may have errors that were not dictated and not seen in editing.    Teaching Attestation: Dr. Melton Krebs saw and evaluated the patient and participated in all aspects of the patient encounter and all procedures.    Scribe's Attestation: Mary Sella. Elonda Giuliano, MD obtained and performed the history, physical exam and medical decision making elements that were entered into the chart. Signed by Corrin Parker, Scribe, on June 23, 2022 at 11:50 AM.    ----------------------------------------------------------------------------------------------------------------------  June 27, 2022 10:04 AM. Documentation assistance provided by the Scribe. I was present during the time the encounter was recorded. The information recorded by the Scribe was done at my direction and has been reviewed and validated by me.  ----------------------------------------------------------------------------------------------------------------------

## 2022-06-23 ENCOUNTER — Ambulatory Visit: Admit: 2022-06-23 | Discharge: 2022-06-24 | Payer: MEDICAID

## 2022-06-23 DIAGNOSIS — H906 Mixed conductive and sensorineural hearing loss, bilateral: Principal | ICD-10-CM

## 2022-06-23 NOTE — Unmapped (Signed)
Attempted to connect with Mr. Taira symptoms reported on final HealthScore survey. No answer. Did leave voicemail with my name, role, reason for calling, and direct callback number.    Owens Shark, BSN, RN, [facility]   She/Her/Hers  HealthScore Resource Nurse  [facility]  [address], [place], [place] [address]

## 2022-06-30 ENCOUNTER — Telehealth
Admit: 2022-06-30 | Discharge: 2022-07-01 | Payer: MEDICAID | Attending: Student in an Organized Health Care Education/Training Program | Primary: Student in an Organized Health Care Education/Training Program

## 2022-06-30 DIAGNOSIS — F32A Depression, unspecified depression type: Principal | ICD-10-CM

## 2022-06-30 DIAGNOSIS — C9100 Acute lymphoblastic leukemia not having achieved remission: Principal | ICD-10-CM

## 2022-06-30 DIAGNOSIS — G893 Neoplasm related pain (acute) (chronic): Principal | ICD-10-CM

## 2022-06-30 DIAGNOSIS — Z515 Encounter for palliative care: Principal | ICD-10-CM

## 2022-06-30 DIAGNOSIS — R5383 Other fatigue: Principal | ICD-10-CM

## 2022-06-30 MED ORDER — OXYCODONE 5 MG TABLET
ORAL_TABLET | ORAL | 0 refills | 8 days | Status: CP | PRN
Start: 2022-06-30 — End: 2022-07-14

## 2022-06-30 NOTE — Unmapped (Signed)
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 He was recently admitted to [facility] with lowe extremity pain and edema. Has had episodic fluctuations in mood but currently doing fairly well.  Has decided not to pursue bone marrow transplant at this time.     Assessment/Plan:      # Depressed Mood: Mood has been vacillating recently as he continues to struggle with not being able to do things he is used to, like working in his woodshop, without being affected by pain and fatigue. Still intermittent periods of depression, had a brief period of feeling exceptionally well but has been down and fatigued again the last several days. Overall more optimistic and hopeful though.    -Increase mirtazapine from 15 mg to 30 mg at bedtime   -Medications per Adam Keith; Dr. Genice Keith to contact her after visit today        #Pain: Still with persistent pain, primarily in back and flank regions, which limits him in his daily activities. More recently he has been having more significant pain in his rib region, this in conjunction with worsened respiratory symptoms/coughing. Recently discontinued gabapentin. He stated that he has not recently been applying butrans patch as he was told by pharmacy this was contraindicated with his use of doxycycline.  He felt that this was initially somewhat effective but has not seemed to help much lately. He is not interested in re-trialing oxycontin. Currently stable, no changes today    -Continue gabapentin to 600mg  qAM and 900mg  qbedtime  - oxycodone 5 mg to 10 mg q4 prn for breakthrough pain, only using twice a day, refill sent today  -Encouraged patient to reach out via MyChart with updates in how he is doing in light of these changes    #Persistent reproductive cough, ENT infections, low grade fevers  -He endorses the above symptoms for the past two months. He has been on different oral antibiotics which do not seem to have provided much relief.    -I will message ID team to try to get patient scheduled for follow-up evaluation (he was last seen in August).      # LUE pain: Improved.   - Pain management as above     #Chronic pain: At baseline, see above  - Pain management as above     #Advance care planning: Continues to prioritize quality of life.  After discussion with infectious disease as well as oncology has made the decision not to pursue another bone marrow transplant at this time.     #Controlled substances risk management:  Patient does not have a signed pain medication agreement with our team.  NCCSRS database was reviewed today and it was appropriate.  Urine drug screen was not performed at this visit. Findings: not applicable.  Patient has received information about safe storage and administration of medications.  Patient has not received a prescription for narcan; is not applicable.      F/u: 4-6 weeks     ----------------------------------------  Referring Provider: Dr. Berline Keith  Oncology Team: Malignant hematology team  PCP: Adam Ezra Sites, MD     HPI: 42 year old man with a diagnosis of B-ALL, diagnosed in October 2021, now in complete remission. Recently admitted to the [facility] ICU 03/06/21-03/20/21 for AHRF d/t MRSA/klebsiella PNA, requiring intubation. This is following admissions in August, May, April, March, February, and January 2022.  Interval History:   Has been up and down. Felt really good for a week a couple weeks ago. Started feeling tired and down again the last few days. Just finished his steroids this week and his hearing has gotten worse. Has gained 20 lbs over a week and a half.     Feeling hopeful and optimistic that as the weather gets better. Pain is pretty good still taking Oxycodone twice a day and feels like this has helped, although hasn't been very active. So worried about what will happen when he does get more active. Has some pain in the side of his chest.         Palliative Performance Scale: 90% - Ambulation: Full / Normal Activity, some evidence of disease / Self-Care:Full / Intake: Normal / Level of Conscious: Full     Coping/Support Issues: Having a lot of difficulty coping with his current setback. Well supported by his wife. Also has a psychiatrist and therapist.     Goals of Care: Treatment and cancer-directed therapy     Social History:  Name of primary support: Wife Adam Keith, 2 daughters.  Occupation: Was in the [employer]	[place]  Hobbies: Woodworking  Current residence / distance from [facility]: [place]     Has a 38 year old son.      Advance Care Planning: Did not discuss this visit.        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    Onion Anaphylaxis    Vancomycin Analogues Other (See Comments)       Hearing loss with Lasix     Other reaction(s): Other (See Comments)     Hearing loss     lasix     Hearing loss      lasix      Hearing loss with Lasix Other reaction(s): Other (See Comments) Hearing loss lasix    Bismuth Subsalicylate Nausea And Vomiting    Privigen [Immun Glob G(Igg)-Pro-Iga 0-50] Other (See Comments)       03/10/2021 VIG infusion stopped as patient developed rigors, tachycardia and HTN during infusion despite pre-medication; 03/12/21 completed IVIG with premedications and slower rate of infusion    Furosemide Other (See Comments)       With Vancomycin caused hearing loss     Other reaction(s): Other (See Comments)     With Vancomycin caused hearing loss      With Vancomycin caused hearing loss Other reaction(s): Other (See Comments) With Vancomycin caused hearing loss    Gammagard Family History:  Cancer-related family history includes Cancer in his maternal grandfather and mother. There is no history of Melanoma.  He indicated that the status of his mother is unknown. He indicated that the status of his father is unknown. He indicated that the status of his sister is unknown. He indicated that the status of his brother is unknown. He indicated that the status of his maternal grandmother is unknown. He indicated that the status of his maternal grandfather is unknown. He  indicated that the status of his paternal grandmother is unknown. He indicated that the status of his paternal grandfather is unknown. He indicated that the status of his maternal aunt is unknown. He indicated that the status of his maternal uncle is unknown. He indicated that the status of his paternal aunt is unknown. He indicated that the status of his paternal uncle is unknown. He indicated that the status of his neg hx is unknown.        REVIEW OF SYSTEMS:  A comprehensive review of 10 systems was negative except for pertinent positives noted in HPI.           Lab Results   Component Value Date     CREATININE 1.93 (H) 02/22/2022            Lab Results   Component Value Date     ALKPHOS 92 02/22/2022     BILITOT 0.4 02/22/2022     BILIDIR 0.20 02/07/2022     PROT 6.6 02/22/2022     ALBUMIN 4.1 02/22/2022     ALT 96 (H) 02/22/2022     AST 45 (H) 02/22/2022      Physical exam:  Gen: NAD  Pulm: nl WOB  Skin: no rashes on exposed skin  Psych: A&O x3        The patient reports they are physically located in [place] and is currently: not at home. I conducted a audio/video visit. I spent  72m 45s on the video call with the patient. I spent an additional 10 minutes on pre- and post-visit activities on the date of service .

## 2022-07-01 ENCOUNTER — Ambulatory Visit: Admit: 2022-07-01 | Discharge: 2022-07-04 | Disposition: A | Discharge status: Home / Self Care | Payer: MEDICAID

## 2022-07-01 ENCOUNTER — Encounter
Admit: 2022-07-01 | Discharge: 2022-07-04 | Disposition: A | Discharge status: Home / Self Care | Payer: MEDICAID | Attending: Student in an Organized Health Care Education/Training Program

## 2022-07-01 LAB — COMPREHENSIVE METABOLIC PANEL
ALBUMIN: 4.1 g/dL (ref 3.4–5.0)
ALKALINE PHOSPHATASE: 89 U/L (ref 46–116)
ALT (SGPT): 87 U/L — ABNORMAL HIGH (ref 10–49)
ANION GAP: 7 mmol/L (ref 5–14)
AST (SGOT): 28 U/L (ref <=34)
BILIRUBIN TOTAL: 0.5 mg/dL (ref 0.3–1.2)
BLOOD UREA NITROGEN: 16 mg/dL (ref 9–23)
BUN / CREAT RATIO: 11
CALCIUM: 9 mg/dL (ref 8.7–10.4)
CHLORIDE: 105 mmol/L (ref 98–107)
CO2: 24 mmol/L (ref 20.0–31.0)
CREATININE: 1.51 mg/dL — ABNORMAL HIGH
EGFR CKD-EPI (2021) MALE: 59 mL/min/{1.73_m2} — ABNORMAL LOW (ref >=60)
GLUCOSE RANDOM: 84 mg/dL (ref 70–179)
POTASSIUM: 4.2 mmol/L (ref 3.4–4.8)
PROTEIN TOTAL: 6.6 g/dL (ref 5.7–8.2)
SODIUM: 136 mmol/L (ref 135–145)

## 2022-07-01 LAB — BLOOD GAS CRITICAL CARE PANEL, VENOUS
BASE EXCESS VENOUS: 1.3 (ref -2.0–2.0)
CALCIUM IONIZED VENOUS (MG/DL): 4.51 mg/dL (ref 4.40–5.40)
GLUCOSE WHOLE BLOOD: 101 mg/dL (ref 70–179)
HCO3 VENOUS: 29 mmol/L — ABNORMAL HIGH (ref 22–27)
HEMOGLOBIN BLOOD GAS: 18.7 g/dL — ABNORMAL HIGH (ref 13.50–17.50)
LACTATE BLOOD VENOUS: 1.1 mmol/L (ref 0.5–1.8)
O2 SATURATION VENOUS: 76.8 % (ref 40.0–85.0)
PCO2 VENOUS: 53 mmHg (ref 40–60)
PH VENOUS: 7.34 (ref 7.32–7.43)
PO2 VENOUS: 40 mmHg (ref 30–55)
POTASSIUM WHOLE BLOOD: 4.2 mmol/L (ref 3.4–4.6)
SODIUM WHOLE BLOOD: 139 mmol/L (ref 135–145)

## 2022-07-01 LAB — CBC W/ AUTO DIFF
BASOPHILS ABSOLUTE COUNT: 0.2 10*9/L — ABNORMAL HIGH (ref 0.0–0.1)
BASOPHILS RELATIVE PERCENT: 1 %
EOSINOPHILS ABSOLUTE COUNT: 0.2 10*9/L (ref 0.0–0.5)
EOSINOPHILS RELATIVE PERCENT: 1.3 %
HEMATOCRIT: 51.4 % — ABNORMAL HIGH (ref 39.0–48.0)
HEMOGLOBIN: 17.6 g/dL — ABNORMAL HIGH (ref 12.9–16.5)
LYMPHOCYTES ABSOLUTE COUNT: 2.1 10*9/L (ref 1.1–3.6)
LYMPHOCYTES RELATIVE PERCENT: 13.6 %
MEAN CORPUSCULAR HEMOGLOBIN CONC: 34.3 g/dL (ref 32.0–36.0)
MEAN CORPUSCULAR HEMOGLOBIN: 30 pg (ref 25.9–32.4)
MEAN CORPUSCULAR VOLUME: 87.5 fL (ref 77.6–95.7)
MEAN PLATELET VOLUME: 8.3 fL (ref 6.8–10.7)
MONOCYTES ABSOLUTE COUNT: 0.8 10*9/L (ref 0.3–0.8)
MONOCYTES RELATIVE PERCENT: 5.3 %
NEUTROPHILS ABSOLUTE COUNT: 12.2 10*9/L — ABNORMAL HIGH (ref 1.8–7.8)
NEUTROPHILS RELATIVE PERCENT: 78.8 %
PLATELET COUNT: 147 10*9/L — ABNORMAL LOW (ref 150–450)
RED BLOOD CELL COUNT: 5.88 10*12/L — ABNORMAL HIGH (ref 4.26–5.60)
RED CELL DISTRIBUTION WIDTH: 16.3 % — ABNORMAL HIGH (ref 12.2–15.2)
WBC ADJUSTED: 15.5 10*9/L — ABNORMAL HIGH (ref 3.6–11.2)

## 2022-07-01 LAB — BLOOD GAS, VENOUS
BASE EXCESS VENOUS: 1.7 (ref -2.0–2.0)
HCO3 VENOUS: 26 mmol/L (ref 22–27)
O2 SATURATION VENOUS: 86.5 % — ABNORMAL HIGH (ref 40.0–85.0)
PCO2 VENOUS: 44 mmHg (ref 40–60)
PH VENOUS: 7.39 (ref 7.32–7.43)
PO2 VENOUS: 47 mmHg (ref 30–55)

## 2022-07-01 LAB — SLIDE REVIEW

## 2022-07-01 LAB — HIGH SENSITIVITY TROPONIN I - 2 HOUR SERIAL
HIGH SENSITIVITY TROPONIN - DELTA (0-2H): 1 ng/L (ref <=7)
HIGH-SENSITIVITY TROPONIN I - 2 HOUR: 6 ng/L (ref <=53)

## 2022-07-01 LAB — LIPASE: LIPASE: 98 U/L — ABNORMAL HIGH (ref 12–53)

## 2022-07-01 LAB — LACTATE SEPSIS, VENOUS: LACTATE BLOOD VENOUS: 1.2 mmol/L (ref 0.5–1.8)

## 2022-07-01 LAB — B-TYPE NATRIURETIC PEPTIDE: B-TYPE NATRIURETIC PEPTIDE: 15 pg/mL (ref <=100)

## 2022-07-01 LAB — HIGH SENSITIVITY TROPONIN I - SERIAL: HIGH SENSITIVITY TROPONIN I: 7 ng/L (ref <=53)

## 2022-07-01 LAB — LACTATE, VENOUS, WHOLE BLOOD: LACTATE BLOOD VENOUS: 1.1 mmol/L (ref 0.5–1.8)

## 2022-07-01 LAB — GREEN LITHIUM HEPARIN EXTRA TUBE

## 2022-07-01 MED ADMIN — piperacillin-tazobactam (ZOSYN) IVPB (premix) 4.5 g: 4.5 g | INTRAVENOUS | @ 22:00:00 | Stop: 2022-07-01

## 2022-07-01 MED ADMIN — HYDROmorphone (PF) (DILAUDID) injection 1 mg: 1 mg | INTRAVENOUS | @ 21:00:00 | Stop: 2022-07-15

## 2022-07-01 MED ADMIN — ondansetron (ZOFRAN-ODT) disintegrating tablet 4 mg: 4 mg | ORAL | @ 21:00:00

## 2022-07-01 MED ADMIN — iohexol (OMNIPAQUE) 350 mg iodine/mL solution 75 mL: 75 mL | INTRAVENOUS | @ 22:00:00 | Stop: 2022-07-01

## 2022-07-01 MED ADMIN — oxyCODONE (ROXICODONE) immediate release tablet 5-10 mg: 5-10 mg | ORAL | @ 17:00:00 | Stop: 2022-07-15

## 2022-07-01 MED ADMIN — sodium chloride (NS) 0.9 % infusion: 20 mL/h | INTRAVENOUS | @ 22:00:00

## 2022-07-01 MED ADMIN — HYDROmorphone (PF) (DILAUDID) injection 1 mg: 1 mg | INTRAVENOUS | @ 18:00:00 | Stop: 2022-07-01

## 2022-07-01 NOTE — Unmapped (Signed)
Patient coming in with dizziness, SOB and left ear pain and deafness. Known ear infection, finished steroids and abx, not better. No fevers. Does endorse sweating

## 2022-07-01 NOTE — Unmapped (Cosign Needed)
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)    PTSD (post-traumatic stress disorder)    Anxiety    Insomnia    Immunocompromised (CMS-HCC)    Hypogammaglobulinemia (CMS-HCC)    ALL (acute lymphoblastic leukemia) (CMS-HCC)    Chronic pain    COPD (chronic obstructive pulmonary disease) (CMS-HCC)    Acute lymphoblastic leukemia in remission (CMS-HCC)    Incarcerated umbilical hernia    Late effect of fracture of multiple bones    Low back pain    Migraine    Obesity    Pain in joint involving ankle and foot    Panic attacks    CKD (chronic kidney disease)    Hypersensitivity    Bilateral lower extremity edema    Popliteal DVT (deep venous thrombosis) (CMS-HCC)    Hypertension    Acute hypoxic respiratory failure (CMS-HCC)    Subacute pulmonary embolism (CMS-HCC)    Obstructive sleep apnea        Assessment/Plan: Adam Keith is an 42 y.o. male with a pmh of  Ph+ B-ALL here with dypnea concerning for infection.     Recurrent Infections - Immunosuppression  Patient reports ongoing respiratory symptoms, fatigue, ear pain, and night sweats for the past two months. He has been treated with a variety of antibiotics without any relief. Patient is on chronic Doxy, Bactrim, and Valtrex for prophylaxis. Despite this, patient presenting with acute onset of dypnea and cough. Patient also with complaints of ear fullness and pain. RPP negative in the ED. Lung sounds were clear to auscultation bilaterally. CXR unremarkable.  I do suspect however that this is infectious vs    - Linezolid & Zosyn per ICID recommendations  - ICID on board; will continue to follow   - oxygen via nasal cannula; titrate for sats above 92%  - Bcx pending  - LRCx pending    L Ear Pain  Patient follows with ENT outpatient. Reports of worsening left ear pain and hearing loss. 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 This time his left sided hearing loss started a few months ago and has gotten worse. 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 He reports no relief. I suspect he may have otitis media. Will cover with broad spectrum antibiotics as above.   - ENT on board; appreciate recs  - abx as above    Ph+ B-ALL   He is s/p induction GRAAPH-2005 induction. The course was complicated by septic shock, candida krusei fungemia, MRSA bacteremia with septic emboli c/b acute renal failure and respiratory distress requiring dialysis and intubation. 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 He subsequently had difficulty tolerating single agent dasatinib (as a bridge to planned ponatinib-blinatumomab--had fluid retention and pleural effusion).  He then completed 4 cycles of ponatinib-blinatumomab, initiated for treatment of MRD in the setting of poor tolerance of standard therapy. He decided to forego bone marrow transplant.   - pharmacy to approve chemo; wife is going to bring from home  Mood Disorder  Patient has ongoing trouble with mood and depression in the setting of chronic ailments. He used to like working in his woodshop, but cannot anymore due to pain and fatigue. He follows with Dr. [name] of [facility].   - continue home Mirtazapine, Nortriptyline    Cancer related fatigue:  Patient endorses fatigue with onset of cancer symptoms or treatment.  Symptoms started many weeks ago.   Primary symptoms are tired, fatigue, and night sweats      Cancer related Pain:  Patient endorses pain associated with Leukemia.    -Current Regimen: Oxycodone 5-10 mg PO    Immunocompromised status: Patient is immunocompromised secondary to leukemia and chemotherapy  -Antimicrobial treatment as above     Impending Electrolyte Abnormality Secondary to Chemotherapy  -Daily Electrolyte monitoring  -Replete per [facility] guidelines.     Anemia/thrombocytopenia secondary to disease: Patient with relatively normal blood counts in .   - Transfuse 1 unit of pRBCs for hgb >7  - Transfuse 1 unit plt for plts >10K     HPI: Adam Keith is an 42 y.o. male who presented with chills, fatigue, and dyspnea of acute onset . Patient reports he was in his usual state of health when he began feeling more tired and fatigued. He reports he has woken up the last few nights drenched in sweat. He has a cough and shortness of breath. Patient also reports episodes of intermittent nausea. He says this hit him all at once. He denies fevers or GI symptoms. No recent sick contacts. He says he was recently treated with a steroid taper and Augmentin for an ear infection. He reports this has not gotten any better and his left ear is still hurting.     In the ED, patient was satting 91 on RA and placed on 2L [place]. His CXR was without acute cardiopulmonary abnormality. WBC of 14.5. EKG revealed NSR. CTA chest was pending. Admitted to 4 ONC with acute hypoxic respiratory failure of unknown etiology.     Review of Systems: All positive and pertinent negatives are noted in the HPI; otherwise all other systems are negative    Oncologic History:   Primary Oncologist: Dr. Senaida Ores  Hematology/Oncology History Overview Note   Referring/Local Oncologist: None    Diagnosis:Ph+ ALL    Genetics:    Karyotype/FISH:Abnormal Karyotype: 46,XY,t(9;22)(q34;q11.2)[1]/45,XY,der(7;9)(q10;q10)t(9;22)(q34;q11.2),der(22)t(9;22)[11]/46,sdl,+der(22)t(9;22)[5]/46,XY[3]     Abnormal FISH: A BCR/ABL1 interphase FISH assay shows an abnormal signal pattern in 97% of the 100 cells scored. 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  The findings support a diagnosis of leukemia and have implications for targeted therapy and for monitoring residual disease.      Molecular Genetics:  BCR-ABL1 p210 transcripts were detected at a level of 46.479 IS% ratio in bone marrow.  BCR-ABL1 p190 transcripts were detected at a level of 4 in 100,000 cells in bone marrow.    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)     01/24/2020 - 04/02/2020 Chemotherapy    IP/OP LEUKEMIA GRAAPH-2005 + RITUXIMAB < 60 YO  rituximab hypercvad (odd and even course)     02/24/2020 Remission    CR with PCR  MRD+; marrow showing 70% blasts with <1% blasts, negative flow MRD, BCR-ABL p210 PCR 0.197%; CNS negative for blasts     03/09/2020 Progression    CSF - rare blast ID'ed - IT chemo given    03/13/20 - IT chemo, CSF negative     04/16/2020 Biopsy    BM Bx with 40-50% cellularity, <1% blasts, MRD flow cytometry negative, BCR-ABL p210 transcripts 0.036%     05/28/2020 - 05/28/2020 Chemotherapy    OP AML - CNS THERAPY (INTRATHECAL CYTARABINE, INTRATHECAL METHOTREXATE, OR INTRATHECAL TRIPLE)  Select one of the following: cytarabine IT 100 mg with hydrocortisone 50 mg, cytarabine IT 40 mg with methotrexate 15 mg with hydrocortisone 50 mg, OR methotrexate IT 12 mg with hydrocortisone 50 mg     06/19/2020 -  Chemotherapy    IP/OP LEUKEMIA BLINATUMOMAB 7-DAY INFUSION (MINIMAL RESIDUAL DISEASE; WT >= 22 KG) (HOME INFUSION)      Cycles 1*-4: Blinatumomab 28 mcg/day Days 1-28 of 6-week cycle.  *Given in the inpatient setting on Days 1-3 on Cycle 1 and Days 1-2 on Cycle 2, while other treatment days are given in the outpatient setting.    Cycle 1 MRD Dosing     07/18/2020 Adverse Reaction    Hospitalization: fevers. Pneumonia     07/23/2020 Biopsy    Diagnosis  Bone marrow, right iliac, aspiration and biopsy  -   Normocellular bone marrow (50%) with trilineage hematopoiesis and 1% blasts by manual aspirate differential  -   Flow cytometry MRD analysis reveals no definitive immunophenotypic evidence of residual B lymphoblastic leukemia   - BCR-ABL p210 0.006%         08/10/2020 -  Chemotherapy    Cycle 2 blinatumomab-ponatinib  Ponatinib 30mg     1 IT per cycle     09/21/2020 Adverse Reaction    Covid-19, symptomatic but not requiring hospitalization.     10/06/2020 -  Chemotherapy    Cycle 3 blinatumomab-ponatinib  Ponatinib 30mg        11/01/2020 Adverse Reaction    Hospitalization: MRSA Pneumonia requiring intubation    Blinatumomab stopped.  Ponatinib held until he stabilized.       12/14/2020 -  Chemotherapy    Cycle 4 blinatumomab 28 mcg/day days 1-28, ponatinib 30 mg per day IT triple therapy day 29     01/13/2021 -  Chemotherapy    Ponatinib 30mg  daily  - due to due low level BCR-ABL     03/06/2021 Adverse Reaction    Hospitalization - ICU on ventilator - influenza and MRSA/klebsiella PNA     06/13/2022 -  Chemotherapy    OP LEUKEMIA VINCRISTINE  vinCRIStine 2 mg IV on day 1     ALL (acute lymphoblastic leukemia) (CMS-HCC)   06/11/2020 -  Chemotherapy    IP/OP LEUKEMIA BLINATUMOMAB 7-DAY INFUSION (MINIMAL RESIDUAL DISEASE; WT >= 22 KG) (HOME INFUSION)  Cycles 1*-4: Blinatumomab 28 mcg/day Days 1-28 of 6-week cycle.  *Given in the inpatient setting on Days 1-3 on Cycle 1 and Days 1-2 on Cycle 2, while other treatment days are given in the outpatient setting.     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  *Given in the inpatient setting on Days 1-3 on Cycle 1 and Days 1-2 on Cycle 2, while other treatment days are given in the outpatient setting.     11/09/2020 Initial Diagnosis    Acute lymphoblastic leukemia in remission (CMS-HCC)     06/13/2022 -  Chemotherapy    OP LEUKEMIA VINCRISTINE  vinCRIStine 2 mg IV on day 1         Medical History:  PCP: Dierdre Harness, MD  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    Septic shock due to Staphylococcus aureus (CMS-HCC) 01/28/2020    Tear of medial meniscus of knee 10/25/2016    Testicular pain, left 01/21/2022    Surgical History:  Past Surgical History:   Procedure Laterality Date    BONE MARROW BIOPSY & ASPIRATION  01/21/2020         CHG [place], CHEST,REAL TIME  11/20/2020    Procedure: ULTRASOUND, CHEST, REAL TIME WITH IMAGE DOCUMENTATION;  Surgeon: Jerelyn Charles, MD;  Location: BRON[facility] PROCEDURE LAB [facility];  Service: Pulmonary    IR INSERT PORT AGE GREATER THAN 5 YRS  03/10/2020    IR INSERT PORT AGE GREATER THAN 5 YRS 03/10/2020 [name], MD IMG VIR H&V [facility]    PR BRONCHOSCOPY,DIAGNOSTIC W LAVAGE Bilateral 07/20/2020    Procedure: BRONCHOSCOPY, RIGID OR FLEXIBLE, INCLUDE FLUOROSCOPIC GUIDANCE WHEN PERFORMED; W/BRONCHIAL ALVEOLAR LAVAGE WITH MODERATE SEDATION;  Surgeon: Dellis Filbert, MD;  Location: [facility] PROCEDURE LAB [facility];  Service: Pulmonary      Social History:  Social History     Socioeconomic History    Marital status: Married   Tobacco Use    Smoking status: Every Day     Current packs/day: 2.00     Types: Cigarettes     Passive exposure: Current    Smokeless tobacco: Former     Types: Financial planner    Vaping status: Never Used   Substance and Sexual Activity    Alcohol use: Not Currently     Comment: Rarely    Drug use: Never   Other Topics Concern    Do you use sunscreen? No    Tanning bed use? No    Are you easily burned? Yes    Excessive sun exposure? No    Blistering sunburns? Yes     Social Determinants of Health     Financial Resource Strain: Low Risk  (03/08/2021)    Overall Financial Resource Strain (CARDIA)     Difficulty of Paying Living Expenses: Not very hard   Food Insecurity: No Food Insecurity (03/08/2021)    Hunger Vital Sign     Worried About Running Out of Food in the Last Year: Never true     Ran Out of Food in the Last Year: Never true   Transportation Needs: No Transportation Needs (03/08/2021)    PRAPARE - Therapist, art (Medical): No     Lack of Transportation (Non-Medical): No    Received from [facility]    Social Network      Family History:   family history includes Cancer in his maternal grandfather and mother; No Known Problems in  his brother, father, maternal aunt, maternal grandmother, maternal uncle, paternal aunt, paternal grandfather, paternal grandmother, paternal uncle, and sister.       Allergies: is allergic to bupropion hcl, cefepime, ceftaroline fosamil, dapsone, onion, vancomycin analogues, bismuth subsalicylate, privigen [immun glob g(igg)-pro-iga 0-50], furosemide, and gammagard.    Medications:   Meds:  Continuous Infusions:  PRN Meds:.oxyCODONE    Objective:   Vitals: Temp:  [36.5 ??C (97.7 ??F)] 36.5 ??C (97.7 ??F)  Heart Rate:  [83-95] 83  SpO2 Pulse:  [82] 82  Resp:  [21-22] 21  BP: (176)/(106) 176/106  MAP (mmHg):  [121] 121  SpO2:  [91 %-97 %] 91 %    Physical Exam:  General: Resting, in minimal distress, lying in bed  HEENT:  PERRL. No scleral icterus or conjunctival injection. MMM without ulceration, erythema or exudate. No cervical or axillary lymphadenopathy.   Heart:  RRR. S1, S2. No murmurs, gallops, or rubs.  Lungs:  Breathing is labored, and patient is dyspneic.  No stridor.  CTAB. No rales, ronchi, or crackles.    Abdomen:  No distention or pain on palpation.  Bowel sounds are present and normoactive x 4.  No palpable hepatomegaly or splenomegaly.  No palpable masses.  Skin:  No rashes, petechiae or purpura.  No areas of skin breakdown. Warm to touch, dry, smooth, and even.  Musculoskeletal:  No grossly-evident joint effusions or deformities.  Range of motion about the shoulder, elbow, hips and knees is grossly normal.  Psychiatric:  Range of affect is appropriate.    Neurologic:  Alert and oriented to person, place, time and situation.   CNII-CNXII grossly intact.  Extremities:  Appear well-perfused. No clubbing, edema, or cyanosis.  CVAD: R CW Port - no erythema, nontender; dressing CDI.      Test Results  Recent Labs     07/01/22  1216   WBC 15.5*   NEUTROABS 12.2*   HGB 17.6*   PLT 147*     Recent Labs     07/01/22  1216   NA 136   K 4.2   CL 105   CO2 24.0   BUN 16   CREATININE 1.51*   CALCIUM 9.0       Imaging: Radiology studies were personally reviewed    DVT PPX Indicated: yes, pharmacologic prophylaxis (with any of the following: enoxaparin (Lovenox) 40mg  SQ 2 hours prior to surgery then every day)  FEN:  Discharge Plan:  - fluids: no  - electrolytes: stable   - diet: regular     Need for PT: yes  Anticipated Discharge: Their home    Code Status:  Full Code    Time spent on counseling/coordination of care: 45 Minutes  Total time spent with patient: 53 Minutes     Melvia Heaps, MD  PGY-1 Internal Medicine

## 2022-07-01 NOTE — Unmapped (Shared)
HPI: 42 y.o. gentleman with PMHx of Ph+ B-ALL (diagnosed in 01/2020, now in complete remission) presented to [facility] with shortness of breath.    Last ICHID note on 05/31/22.    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  This occurred about 1 and 1/2 months ago.  He had a preceding earache.  No otorrhea.  He has intermittent ear popping on the left and he notes that that prior to his hearing loss he had fluctuating hearing with ear popping.  This that he woke up 1 morning and the ear pain was gone, but the hearing on the left was also gone.  He denies dizziness or vertigo.  He has no rhinologic complaints today     Although this was about a month and a half and ago, we discussed the risks, benefits, and alternatives of an oral steroid taper, particularly this long after his hearing loss.  He states that he had sudden hearing loss last year and that the steroid taper helped even though it was delayed as well.     [facility] Otolaryngology Clinic Visit Note 3/21:  The patient returns for a follow-up visit. 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 The ear infection made him hypersensitive to sounds. He used ear drops which resolved the pain but resulted in his third episode of sudden left-sided hearing loss. He has difficulty talking on the phone, working, and hearing the television. He is 6 days into a 3-week Prednisone taper which has improved his hearing by 20%. He would like a hearing aid prescription in case his hearing drops again. He also notes any liquid in his ear (drops, water in shower) hurt his ears. He reports infections from implants (MRSA). Doesn't like using nasal sprays.      Outpatient Oncology Palliative Care Note 3/28:  #Persistent reproductive cough, ENT infections, low grade fevers  -He endorses the above symptoms for the past two months. He has been on different oral antibiotics which do not seem to have provided much relief.     -I will message ID team to try to get patient scheduled for follow-up evaluation (he was last seen in August).     ED provider note 3/29:  Impression: This is a 42 y.o. 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 Upon initial evaluation in the emergency department, the patient was non-toxic appearing with vitals as below (elevated BP, otherwise stable and within normal limits).  Oxygen sats have been reassuring on room air.  Breath sounds are clear on exam and although patient appears visibly uncomfortable and in pain, no increased work of breathing at this time. Patient presents with several days of worsening muscle aches, chills, difficulty breathing.  He describes chest discomfort starting in the center of his chest and radiating to bilateral lateral chest walls.  No history of supplemental oxygen use at home.  Will and she workup with VBG, RPP, blood and urine cultures and  urinalysis.  Will also check CBC, CMP, lactate and get a chest x-ray. For pain control, giving patient's home prn oxycodone as well as dilaudid for breakthrough pain. Will admit for ongoing evaluation and management.      BP 176/106  - Pulse 95  - Temp 36.5 ??C (97.7 ??F) (Oral)  - Resp 22  - SpO2 97%      Diagnostic workup as below.  - VBG: 7.39 / 44 / 26  - RPP: negative  - Lactate: within normal limits   - UA: ordered  - Bcx: collected  - CMP: Cr 1.51 (elevated to 1.6-1.8 at baseline); elevated ALT (87)  - CBC: WBC 15.5, Hgb 17.6, Plt 147, ANC 12.2  - Troponin: ordered  - BNP: ordered    Recent Pertinent Micro/Imaging Data:  - 3/29 CXR: unremarkable  - 3/29 RPP neg  - 3/29 Bcx sent

## 2022-07-01 NOTE — Unmapped (Addendum)
[facility]  Emergency Department Provider Note       ED Clinical Impression     Final diagnoses:   Shortness of breath (Primary)        History     Chief Complaint  Chief Complaint   Patient presents with    Shortness of Breath       HPI   Adam Keith is a 42 y.o. male with a history as below who is presenting with shortness of breath.     Patient has a history of persistent redproductive cough, ENT infections, and low-grade fevers (present for the past two months). No improvement after course of oral antibiotics. Last seen by [facility]ID in on 11/23/2021.     Patient is here today with his wife.  He reports difficulty breathing has been going on for a few days but acutely worsened last night.  He is also been having chills, muscle aches, headache, and unsteadiness on his feet.  He has fallen twice-once last night and again this morning.  He localizes the pain to the center of his chest, radiating out to his left and right side.  He also has chronic pain in his back and hips.  Right now his difficulty breathing is to the point where he feels winded even while sitting down and is not able to walk any distance without having difficulty breathing.  He is very short of breath with activity.  At baseline he is not on any oxygen and has never used any supplemental O2 at home.  He does have inhalers which he uses daily and does provide relief, though only temporarily.  He uses oxycodone 5 mg to 10 mg every 4-6 hours as needed for pain and this also provides temporary relief.    Patient denies any recent travel.  No known sick contacts.  He does have seasonal allergies, but has shortness of breath right now feels unrelated to the allergies.     Impression, Medical Decision Making, ED Course     Impression: This is a 42 y.o. 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 Upon initial evaluation in the emergency department, the patient was non-toxic appearing with vitals as below (elevated BP, otherwise stable and within normal limits).  Oxygen sats have been reassuring on room air.  Breath sounds are clear on exam and although patient appears visibly uncomfortable and in pain, no increased work of breathing at this time. Patient presents with several days of worsening muscle aches, chills, difficulty breathing.  He describes chest discomfort starting in the center of his chest and radiating to bilateral lateral chest walls.  No history of supplemental oxygen use at home.  Will and she workup with VBG, RPP, blood and urine cultures and urinalysis.  Will also check CBC, CMP, lactate and get a chest x-ray. For pain control, giving patient's home prn oxycodone as well as dilaudid for breakthrough pain. Will admit for ongoing evaluation and management.     BP 176/106  - Pulse 95  - Temp 36.5 ??C (97.7 ??F) (Oral)  - Resp 22  - SpO2 97%     Diagnostic workup  as below.  - VBG: 7.39 / 44 / 26  - RPP: negative  - Lactate: within normal limits   - UA: ordered  - Bcx: collected  - CMP: Cr 1.51 (elevated to 1.6-1.8 at baseline); elevated ALT (87)  - CBC: WBC 15.5, Hgb 17.6, Plt 147, ANC 12.2  - Troponin: ordered  - BNP: ordered    Patient signed out to evening resident at 15:00.     Orders Placed This Encounter   Procedures    Blood Culture #1    Blood Culture #2    Respiratory Pathogen Panel with COVID    XR Chest 2 views    CTA Chest W Contrast    Blood Gas, Venous    ED Extra Tubes    CBC w/ Differential    Comprehensive Metabolic Panel    Lactate Sepsis, Venous    Urinalysis with Microscopy with Culture Reflex hsTroponin I (serial 0-2-6H w/ delta)    B-type Natriuretic Peptide    hsTroponin I - 2 Hour    hsTroponin I - 6 Hour    Lipase    ECG 12 Lead    ECG 12 Lead    ECG 12 Lead    ED Admit Decision       ED Course as of 07/01/22 1518   Fri Jul 01, 2022   1252 VBG: 7.39/44/26   1331 Creatinine(!): 1.51   1331 eGFR CKD-EPI (2021) Male(!): 59   1331 ALT(!): 87   1331 WBC(!): 15.5   1332 HGB(!): 17.6   1332 Absolute Neutrophils(!): 12.2   1332 Lactate, Venous: 1.2   1414 RPP negative   1428 Dilaudid 1mg  for pain   1430 Paged MAO for admission       ____________________________________________    The case was discussed with Dr. Clovis Fredrickson, MD, who is in agreement with the above assessment and plan.    Dictation software was used while making this note. Please excuse any errors made with dictation software.     Additional Medical Decision Making     I have reviewed the vital signs and the nursing notes. Labs and radiology results that were available during my care of the patient were independently reviewed by me and considered in my medical decision making.     I independently visualized the EKG tracing if performed.  I independently visualized the radiology images if performed.  I reviewed the patient's prior medical records if available.  Additional history obtained from family if available.  I discussed the case with the admitting provider and the consulting services if the patient was admitted and/or consulting services were utilized.     Physical Exam     VITAL SIGNS:      Vitals:    07/01/22 1129   BP: 176/106   Pulse: 95   Resp: 22   Temp: 36.5 ??C (97.7 ??F)   TempSrc: Oral   SpO2: 97%       Physical Exam   Constitutional:   Appears in pain   Eyes: Conjunctivae are normal.   Cardiovascular: Normal rate and regular rhythm.   Pulmonary/Chest: He has no wheezes. He has no rales.   Abdominal: Soft. There is no abdominal tenderness.   Musculoskeletal:      Cervical back: Normal range of motion.   Neurological: He is alert and oriented to person, place, and time.   Skin: Skin is warm and dry. No rash noted.        History  Chief Complaint  Chief Complaint   Patient presents with    Shortness of Breath       HPI   See above     All other systems have been reviewed and are negative except as otherwise documented.    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    Septic shock due to Staphylococcus aureus (CMS-HCC) 01/28/2020    Tear of medial meniscus of knee 10/25/2016    Testicular pain, left 01/21/2022       Past Surgical History:   Procedure Laterality Date    BONE MARROW BIOPSY & ASPIRATION  01/21/2020         CHG [place], CHEST,REAL TIME  11/20/2020    Procedure: ULTRASOUND, CHEST, REAL TIME WITH IMAGE DOCUMENTATION;  Surgeon: Jerelyn Charles, MD;  Location: BRON[facility] PROCEDURE LAB [facility];  Service: Pulmonary    IR INSERT PORT AGE GREATER THAN 5 YRS  03/10/2020    IR INSERT PORT AGE GREATER THAN 5 YRS 03/10/2020 [name], MD IMG VIR [facility]    PR BRONCHOSCOPY,DIAGNOSTIC W LAVAGE Bilateral 07/20/2020    Procedure: BRONCHOSCOPY, RIGID OR FLEXIBLE, INCLUDE FLUOROSCOPIC GUIDANCE WHEN PERFORMED; W/BRONCHIAL ALVEOLAR LAVAGE WITH MODERATE SEDATION;  Surgeon: Dellis Filbert, MD;  Location: [facility]ON[facility] PROCEDURE LAB [facility];  Service: Pulmonary         Current Facility-Administered Medications:     oxyCODONE (ROXICODONE) immediate release tablet 5-10 mg, 5-10 mg, Oral, Q4H PRN, Terrisha Lopata A, MD, 10 mg at 07/01/22 1249    Current Outpatient Medications:     albuterol HFA 90 mcg/actuation inhaler, Inhale 2 puffs every six (6) hours as needed for wheezing. (Patient taking differently: Inhale 2 puffs every six (6) hours as needed for wheezing. prn), Disp: 18 g, Rfl: 0    amLODIPine (NORVASC) 10 MG tablet, Take 1 tablet (10 mg total) by mouth daily., Disp: 30 tablet, Rfl: 0    apixaban (ELIQUIS) 5 mg Tab, Take 1 tablet (5 mg total) by mouth two (2) times a day., Disp: 60 tablet, Rfl: 6    arm brace (WRIST BRACE) Misc, 1 Piece by Miscellaneous route as needed. left ulnar wrist brace, Disp: 1 each, Rfl: 0    azithromycin (ZITHROMAX Z-PAK) 250 MG tablet, Take 1 tablet (250 mg total) by mouth daily. 1 by mouth daily for 4 days (Patient not taking: Reported on 06/23/2022), Disp: 4 tablet, Rfl: 0    carvediloL (COREG) 12.5 MG tablet, Take 1 tablet (12.5 mg total) by mouth Two (2) times a day., Disp: 180 tablet, Rfl: 1    cyclobenzaprine (FLEXERIL) 10 MG tablet, Take 1 tablet (10 mg total) by mouth Three (3) times a day as needed for muscle spasms., Disp: , Rfl:     docusate sodium (COLACE) 100 MG capsule, Take 1 capsule (100 mg total) by mouth daily., Disp: , Rfl:     doxycycline (VIBRA-TABS) 100 MG tablet, Take 1 tablet (100 mg total) by mouth two (2) times a day., Disp: 180 tablet, Rfl: 3    hydroCHLOROthiazide (HYDRODIURIL) 12.5 MG tablet, Take 1 tablet (12.5 mg total) by mouth daily., Disp: 30 tablet, Rfl: 11    mirtazapine (REMERON) 30 MG tablet, Take 1 tablet (30 mg total) by mouth nightly., Disp: 90 tablet, Rfl: 3  nortriptyline (PAMELOR) 50 MG capsule, TAKE 2 CAPSULES BY MOUTH EVERY DAY AT NIGHT, Disp: 180 capsule, Rfl: 4    omeprazole (PRILOSEC) 40 MG capsule, Take 1 capsule (40 mg total) by mouth every morning., Disp: , Rfl:     ondansetron (ZOFRAN-ODT) 4 MG disintegrating tablet, Take 1 tablet (4 mg total) by mouth every eight (8) hours as needed for nausea., Disp: , Rfl:     oxyCODONE (ROXICODONE) 5 MG immediate release tablet, Take 1-2 tablets (5-10 mg total) by mouth every four (4) hours as needed for pain for up to 14 days., Disp: 90 tablet, Rfl: 0    PONATinib (ICLUSIG) 30 mg tablet, Take 1 tablet (30 mg total) by mouth daily. Swallow tablets whole. Do not crush, break, cut or chew tablets., Disp: 30 tablet, Rfl: 5 spironolactone (ALDACTONE) 25 MG tablet, Take 1 tablet (25 mg total) by mouth daily., Disp: 90 tablet, Rfl: 3    sulfamethoxazole-trimethoprim (BACTRIM DS) 800-160 mg per tablet, TAKE 1 TABLET BY MOUTH 2 TIMES A DAY ON SATURDAY, SUNDAY. FOR PROPHYLAXIS WHILE ON CHEMO., Disp: 48 tablet, Rfl: 3    tiotropium-olodaterol (STIOLTO RESPIMAT) 2.5-2.5 mcg/actuation Mist, Inhale 2 puffs daily., Disp: 4 g, Rfl: 3    valACYclovir (VALTREX) 500 MG tablet, Take 1 tablet (500 mg total) by mouth daily., Disp: 90 tablet, Rfl: 11    Allergies  Bupropion hcl, Cefepime, Ceftaroline fosamil, Dapsone, Onion, Vancomycin analogues, Bismuth subsalicylate, Privigen [immun glob g(igg)-pro-iga 0-50], Furosemide, and Gammagard    Family History   Problem Relation Age of Onset    Cancer Mother     No Known Problems Father     No Known Problems Sister     No Known Problems Brother     No Known Problems Maternal Aunt     No Known Problems Maternal Uncle     No Known Problems Paternal Aunt     No Known Problems Paternal Uncle     No Known Problems Maternal Grandmother     Cancer Maternal Grandfather     No Known Problems Paternal Grandmother     No Known Problems Paternal Grandfather     Melanoma Neg Hx     Basal cell carcinoma Neg Hx     Squamous cell carcinoma Neg Hx        Social History  Social History     Tobacco Use    Smoking status: Every Day     Current packs/day: 2.00     Types: Cigarettes     Passive exposure: Current    Smokeless tobacco: Former     Types: Financial planner    Vaping status: Never Used   Substance Use Topics    Alcohol use: Not Currently     Comment: Rarely    Drug use: Never        Radiology     XR Chest 2 views   Final Result      No acute cardiopulmonary abnormalities.      CTA Chest W Contrast    (Results Pending)        Laboratory Data     Lab Results   Component Value Date    WBC 15.5 (H) 07/01/2022    HGB 17.6 (H) 07/01/2022    HCT 51.4 (H) 07/01/2022    PLT 147 (L) 07/01/2022       Lab Results   Component Value Date    NA 136 07/01/2022    K 4.2 07/01/2022  CL 105 07/01/2022    CO2 24.0 07/01/2022    BUN 16 07/01/2022    CREATININE 1.51 (H) 07/01/2022    GLU 84 07/01/2022    CALCIUM 9.0 07/01/2022    MG 2.0 02/22/2022    PHOS 4.0 02/08/2022       Lab Results   Component Value Date    BILITOT 0.5 07/01/2022    BILIDIR 0.20 02/07/2022    PROT 6.6 07/01/2022    ALBUMIN 4.1 07/01/2022    ALT 87 (H) 07/01/2022    AST 28 07/01/2022    ALKPHOS 89 07/01/2022       Lab Results   Component Value Date    INR 0.91 04/07/2022    APTT 62.2 (H) 02/08/2022       Pertinent labs & imaging results that were available during my care of the patient were reviewed by me and considered in my medical decision making (see chart for details).    Portions of this record have been created using Scientist, clinical (histocompatibility and immunogenetics). Dictation errors have been sought, but may not have been identified and corrected.      Alvino Chapel, MD  Resident  07/01/22 1429       Alvino Chapel, MD  Resident  07/01/22 [phone]

## 2022-07-02 LAB — URINALYSIS WITH MICROSCOPY WITH CULTURE REFLEX
BACTERIA: NONE SEEN /HPF
BILIRUBIN UA: NEGATIVE
GLUCOSE UA: NEGATIVE
KETONES UA: NEGATIVE
LEUKOCYTE ESTERASE UA: NEGATIVE
NITRITE UA: NEGATIVE
PH UA: 6 (ref 5.0–9.0)
PROTEIN UA: 200 — AB
RBC UA: 2 /HPF (ref <=3)
SPECIFIC GRAVITY UA: 1.035 — ABNORMAL HIGH (ref 1.003–1.030)
SQUAMOUS EPITHELIAL: <1 /HPF (ref 0–5)
UROBILINOGEN UA: <2
WBC UA: 1 /HPF (ref <=2)

## 2022-07-02 LAB — CBC W/ AUTO DIFF
BASOPHILS ABSOLUTE COUNT: 0.1 10*9/L (ref 0.0–0.1)
BASOPHILS RELATIVE PERCENT: 0.5 %
EOSINOPHILS ABSOLUTE COUNT: 0.2 10*9/L (ref 0.0–0.5)
EOSINOPHILS RELATIVE PERCENT: 1.4 %
HEMATOCRIT: 50.5 % — ABNORMAL HIGH (ref 39.0–48.0)
HEMOGLOBIN: 17.2 g/dL — ABNORMAL HIGH (ref 12.9–16.5)
LYMPHOCYTES ABSOLUTE COUNT: 1.4 10*9/L (ref 1.1–3.6)
LYMPHOCYTES RELATIVE PERCENT: 9.5 %
MEAN CORPUSCULAR HEMOGLOBIN CONC: 34.1 g/dL (ref 32.0–36.0)
MEAN CORPUSCULAR HEMOGLOBIN: 30 pg (ref 25.9–32.4)
MEAN CORPUSCULAR VOLUME: 88 fL (ref 77.6–95.7)
MEAN PLATELET VOLUME: 8.3 fL (ref 6.8–10.7)
MONOCYTES ABSOLUTE COUNT: 0.6 10*9/L (ref 0.3–0.8)
MONOCYTES RELATIVE PERCENT: 4.3 %
NEUTROPHILS ABSOLUTE COUNT: 12.2 10*9/L — ABNORMAL HIGH (ref 1.8–7.8)
NEUTROPHILS RELATIVE PERCENT: 84.3 %
PLATELET COUNT: 137 10*9/L — ABNORMAL LOW (ref 150–450)
RED BLOOD CELL COUNT: 5.74 10*12/L — ABNORMAL HIGH (ref 4.26–5.60)
RED CELL DISTRIBUTION WIDTH: 16.1 % — ABNORMAL HIGH (ref 12.2–15.2)
WBC ADJUSTED: 14.5 10*9/L — ABNORMAL HIGH (ref 3.6–11.2)

## 2022-07-02 LAB — BASIC METABOLIC PANEL
ANION GAP: 8 mmol/L (ref 5–14)
BLOOD UREA NITROGEN: 17 mg/dL (ref 9–23)
BUN / CREAT RATIO: 11
CALCIUM: 8.9 mg/dL (ref 8.7–10.4)
CHLORIDE: 105 mmol/L (ref 98–107)
CO2: 24 mmol/L (ref 20.0–31.0)
CREATININE: 1.52 mg/dL — ABNORMAL HIGH
EGFR CKD-EPI (2021) MALE: 58 mL/min/{1.73_m2} — ABNORMAL LOW (ref >=60)
GLUCOSE RANDOM: 102 mg/dL (ref 70–179)
POTASSIUM: 4 mmol/L (ref 3.4–4.8)
SODIUM: 137 mmol/L (ref 135–145)

## 2022-07-02 LAB — TSH: THYROID STIMULATING HORMONE: 3.349 u[IU]/mL (ref 0.550–4.780)

## 2022-07-02 LAB — HEPATIC FUNCTION PANEL
ALBUMIN: 3.8 g/dL (ref 3.4–5.0)
ALKALINE PHOSPHATASE: 87 U/L (ref 46–116)
ALT (SGPT): 70 U/L — ABNORMAL HIGH (ref 10–49)
AST (SGOT): 21 U/L (ref <=34)
BILIRUBIN DIRECT: 0.3 mg/dL (ref 0.00–0.30)
BILIRUBIN TOTAL: 0.7 mg/dL (ref 0.3–1.2)
PROTEIN TOTAL: 6.5 g/dL (ref 5.7–8.2)

## 2022-07-02 LAB — LEGIONELLA ANTIGEN, URINE: LEGIONELLA, URINARY ANTIGEN: NEGATIVE

## 2022-07-02 MED ADMIN — nortriptyline (PAMELOR) capsule 50 mg: 50 mg | ORAL | @ 02:00:00

## 2022-07-02 MED ADMIN — apixaban (ELIQUIS) tablet 5 mg: 5 mg | ORAL

## 2022-07-02 MED ADMIN — HYDROmorphone (PF) (DILAUDID) injection 1 mg: 1 mg | INTRAVENOUS | @ 18:00:00 | Stop: 2022-07-02

## 2022-07-02 MED ADMIN — apixaban (ELIQUIS) tablet 5 mg: 5 mg | ORAL | @ 13:00:00

## 2022-07-02 MED ADMIN — aspirin chewable tablet 81 mg: 81 mg | ORAL | @ 18:00:00

## 2022-07-02 MED ADMIN — piperacillin-tazobactam (ZOSYN) IVPB (premix) 4.5 g: 4.5 g | INTRAVENOUS | @ 18:00:00 | Stop: 2022-07-06

## 2022-07-02 MED ADMIN — oxyCODONE (ROXICODONE) immediate release tablet 15 mg: 15 mg | ORAL | @ 22:00:00 | Stop: 2022-07-02

## 2022-07-02 MED ADMIN — amlodipine (NORVASC) tablet 5 mg: 5 mg | ORAL | @ 13:00:00

## 2022-07-02 MED ADMIN — sulfamethoxazole-trimethoprim (BACTRIM DS) 800-160 mg tablet 160 mg of trimethoprim: 1 | ORAL | @ 16:00:00 | Stop: 2022-07-09

## 2022-07-02 MED ADMIN — sodium chloride (NS) 0.9 % flush 10 mL: 10 mL | INTRAVENOUS | @ 13:00:00

## 2022-07-02 MED ADMIN — linezolid (ZYVOX) tablet 600 mg: 600 mg | ORAL | @ 02:00:00 | Stop: 2022-07-09

## 2022-07-02 MED ADMIN — gadobenate dimeglumine (MULTIHANCE) 529 mg/mL (0.1mmol/0.2mL) solution 20 mL: 20 mL | INTRAVENOUS | @ 19:00:00 | Stop: 2022-07-02

## 2022-07-02 MED ADMIN — prochlorperazine (COMPAZINE) tablet 5 mg: 5 mg | ORAL | @ 05:00:00

## 2022-07-02 MED ADMIN — HYDROmorphone (DILAUDID) tablet 2 mg: 2 mg | ORAL | @ 23:00:00 | Stop: 2022-07-16

## 2022-07-02 MED ADMIN — HYDROmorphone (PF) (DILAUDID) injection 1 mg: 1 mg | INTRAVENOUS | @ 10:00:00 | Stop: 2022-07-02

## 2022-07-02 MED ADMIN — piperacillin-tazobactam (ZOSYN) IVPB (premix) 4.5 g: 4.5 g | INTRAVENOUS | @ 10:00:00 | Stop: 2022-07-06

## 2022-07-02 MED ADMIN — mirtazapine (REMERON) tablet 30 mg: 30 mg | ORAL

## 2022-07-02 MED ADMIN — valACYclovir (VALTREX) tablet 500 mg: 500 mg | ORAL | @ 13:00:00

## 2022-07-02 MED ADMIN — linezolid (ZYVOX) tablet 600 mg: 600 mg | ORAL | @ 13:00:00 | Stop: 2022-07-09

## 2022-07-02 MED ADMIN — piperacillin-tazobactam (ZOSYN) IVPB (premix) 4.5 g: 4.5 g | INTRAVENOUS | @ 03:00:00 | Stop: 2022-07-06

## 2022-07-02 MED ADMIN — sodium chloride (NS) 0.9 % flush 10 mL: 10 mL | INTRAVENOUS

## 2022-07-02 MED ADMIN — carvedilol (COREG) tablet 12.5 mg: 12.5 mg | ORAL | @ 13:00:00

## 2022-07-02 MED ADMIN — sodium chloride (OCEAN) 0.65 % nasal spray 2 spray: 2 | NASAL | @ 20:00:00

## 2022-07-02 MED ADMIN — oxyCODONE (ROXICODONE) immediate release tablet 5-10 mg: 5-10 mg | ORAL | Stop: 2022-07-15

## 2022-07-02 MED ADMIN — HYDROmorphone (PF) (DILAUDID) injection 1 mg: 1 mg | INTRAVENOUS | @ 02:00:00 | Stop: 2022-07-15

## 2022-07-02 MED ADMIN — fluticasone propionate (FLONASE) 50 mcg/actuation nasal spray 2 spray: 2 | NASAL | @ 20:00:00

## 2022-07-02 MED ADMIN — oxymetazoline (AFRIN) 0.05 % nasal spray 3 spray: 3 | NASAL | @ 20:00:00 | Stop: 2022-07-05

## 2022-07-02 NOTE — Unmapped (Addendum)
Hematology/Oncology History Overview Note   Referring/Local Oncologist: None    Diagnosis:Ph+ ALL    Genetics:    Karyotype/FISH:Abnormal Karyotype: 46,XY,t(9;22)(q34;q11.2)[1]/45,XY,der(7;9)(q10;q10)t(9;22)(q34;q11.2),der(22)t(9;22)[11]/46,sdl,+der(22)t(9;22)[5]/46,XY[3]     Abnormal FISH: A BCR/ABL1 interphase FISH assay shows an abnormal signal pattern in 97% of the 100 cells scored. 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  The findings support a diagnosis of leukemia and have implications for targeted therapy and for monitoring residual disease.      Molecular Genetics:  BCR-ABL1 p210 transcripts were detected at a level of 46.479 IS% ratio in bone marrow.  BCR-ABL1 p190 transcripts were detected at a level of 4 in 100,000 cells in bone marrow.    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)     01/24/2020 - 04/02/2020 Chemotherapy    IP/OP LEUKEMIA GRAAPH-2005 + RITUXIMAB < 60 YO  rituximab hypercvad (odd and even course)     02/24/2020 Remission    CR with PCR MRD+; marrow showing 70% blasts with <1% blasts, negative flow MRD, BCR-ABL p210 PCR 0.197%; CNS negative for blasts     03/09/2020 Progression    CSF - rare blast ID'ed - IT chemo given    03/13/20 - IT chemo, CSF negative     04/16/2020 Biopsy    BM Bx with 40-50% cellularity, <1% blasts, MRD flow cytometry negative, BCR-ABL p210 transcripts 0.036%     05/28/2020 - 05/28/2020 Chemotherapy    OP AML - CNS THERAPY (INTRATHECAL CYTARABINE, INTRATHECAL METHOTREXATE, OR INTRATHECAL TRIPLE)  Select one of the following: cytarabine IT 100 mg with hydrocortisone 50 mg, cytarabine IT 40 mg with methotrexate 15 mg with hydrocortisone 50 mg, OR methotrexate IT 12 mg with hydrocortisone 50 mg     06/19/2020 -  Chemotherapy    IP/OP LEUKEMIA BLINATUMOMAB 7-DAY INFUSION (MINIMAL RESIDUAL DISEASE; WT >= 22 KG) (HOME INFUSION)      Cycles 1*-4: Blinatumomab 28 mcg/day Days 1-28 of 6-week cycle.  *Given in the inpatient setting on Days 1-3 on Cycle 1 and Days 1-2 on Cycle 2, while other treatment days are given in the outpatient setting.    Cycle 1 MRD Dosing     07/18/2020 Adverse Reaction    Hospitalization: fevers. Pneumonia     07/23/2020 Biopsy    Diagnosis  Bone marrow, right iliac, aspiration and biopsy  -   Normocellular bone marrow (50%) with trilineage hematopoiesis and 1% blasts by manual aspirate differential  -   Flow cytometry MRD analysis reveals no definitive immunophenotypic evidence of residual B lymphoblastic leukemia   - BCR-ABL p210 0.006%         08/10/2020 -  Chemotherapy    Cycle 2 blinatumomab-ponatinib  Ponatinib 30mg     1 IT per cycle     09/21/2020 Adverse Reaction    Covid-19, symptomatic but not requiring hospitalization.     10/06/2020 -  Chemotherapy    Cycle 3 blinatumomab-ponatinib  Ponatinib 30mg        11/01/2020 Adverse Reaction    Hospitalization: MRSA Pneumonia requiring intubation    Blinatumomab stopped.  Ponatinib held until he stabilized.       12/14/2020 -  Chemotherapy    Cycle 4  blinatumomab 28 mcg/day days 1-28, ponatinib 30 mg per day IT triple therapy day 29     01/13/2021 -  Chemotherapy    Ponatinib 30mg  daily  - due to due low level BCR-ABL     03/06/2021 Adverse Reaction    Hospitalization - ICU on ventilator - influenza and MRSA/klebsiella PNA     06/13/2022 -  Chemotherapy    OP LEUKEMIA VINCRISTINE  vinCRIStine 2 mg IV on day 1     ALL (acute lymphoblastic leukemia) (CMS-HCC)   06/11/2020 -  Chemotherapy    IP/OP LEUKEMIA BLINATUMOMAB 7-DAY INFUSION (MINIMAL RESIDUAL DISEASE; WT >= 22 KG) (HOME INFUSION)  Cycles 1*-4: Blinatumomab 28 mcg/day Days 1-28 of 6-week cycle.  *Given in the inpatient setting on Days 1-3 on Cycle 1 and Days 1-2 on Cycle 2, while other treatment days are given in the outpatient setting.     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  *Given in the inpatient setting on Days 1-3 on Cycle 1 and Days 1-2 on Cycle 2, while other treatment days are given in the outpatient setting.     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  -Recommended flexible fiberoptic laryngoscopy, which the patient declined.  Consider offering this test at a later date.  -Although less likely, patient has known immunocompromise state, which could put him at higher risk for atypical presentations of otologic infections.  Consider dedicated CT with contrast for further evaluation

## 2022-07-02 NOTE — Unmapped (Signed)
Vital signs stable overnight, on 2L oxygen. PRN medications given, see MAR. No falls occurred, call bell within reach.   Vitals:    07/02/22 0349   BP: 145/92   Pulse: 93   Resp: 20   Temp: 36.7 ??C (98.1 ??F)   SpO2: 92%      Problem: Adult Inpatient Plan of Care  Goal: Plan of Care Review  Outcome: Ongoing - Unchanged  Flowsheets (Taken 07/02/2022 0333)  Progress: no change  Plan of Care Reviewed With: patient  Goal: Patient-Specific Goal (Individualized)  Outcome: Ongoing - Unchanged  Flowsheets (Taken 07/02/2022 0333)  Individualized Care Needs: provide PRN medications as needed  Anxieties, Fears or Concerns: pain, dyspnea on exertion, nausea  Goal: Absence of Hospital-Acquired Illness or Injury  Outcome: Ongoing - Unchanged  Intervention: Identify and Manage Fall Risk  Flowsheets  Taken 07/02/2022 0333  Safety Interventions:   fall reduction program maintained   lighting adjusted for tasks/safety   low bed   nonskid shoes/slippers when out of bed  Taken 07/02/2022 0043  Safety Interventions:   fall reduction program maintained   family at bedside   lighting adjusted for tasks/safety   low bed   nonskid shoes/slippers when out of bed  Intervention: Prevent Skin Injury  Flowsheets  Taken 07/02/2022 1610  Positioning for Skin: Supine/Back  Device Skin Pressure Protection:   adhesive use limited   absorbent pad utilized/changed  Skin Protection:   adhesive use limited   protective footwear used  Taken 07/02/2022 0100  Positioning for Skin: Supine/Back  Intervention: Prevent Infection  Flowsheets  Taken 07/02/2022 9604  Infection Prevention:   hand hygiene promoted   rest/sleep promoted   single patient room provided  Taken 07/02/2022 0043  Infection Prevention:   hand hygiene promoted   rest/sleep promoted  Goal: Optimal Comfort and Wellbeing  Outcome: Ongoing - Unchanged  Intervention: Monitor Pain and Promote Comfort  Flowsheets (Taken 07/02/2022 5409)  Pain Management Interventions:   care clustered   pain management plan reviewed with patient/caregiver   quiet environment facilitated  Intervention: Provide Person-Centered Care  Flowsheets (Taken 07/02/2022 8119)  Trust Relationship/Rapport:   care explained   choices provided   emotional support provided   empathic listening provided   questions answered   questions encouraged   reassurance provided   thoughts/feelings acknowledged  Goal: Readiness for Transition of Care  Outcome: Ongoing - Unchanged  Intervention: Mutually Develop Transition Plan  Flowsheets (Taken 07/02/2022 [phone])  Equipment Needed After Discharge: none  Equipment Currently Used at Home: none  Anticipated Changes Related to Illness: none  Transportation Anticipated: none  Transportation Concerns: none  Concerns to be Addressed: denies needs/concerns at this time  Readmission Within the Last 30 Days: no previous admission in last 30 days  Patient/Family Anticipated Services at Transition: none  Patient/Family Anticipates Transition to: home with family  Goal: Rounds/Family Conference  Outcome: Ongoing - Unchanged     Problem: Nausea and Vomiting  Goal: Nausea and Vomiting Relief  Outcome: Ongoing - Unchanged  Intervention: Prevent and Manage Nausea and Vomiting  Flowsheets (Taken 07/02/2022 0333)  Environmental Support: calm environment promoted     Problem: Oncology Care  Goal: Effective Coping  Outcome: Ongoing - Unchanged  Intervention: Support and Enhance Coping Strategies  Flowsheets (Taken 07/02/2022 0333)  Supportive Measures: active listening utilized  Environmental Support: calm environment promoted  Goal: Improved Activity Tolerance  Outcome: Ongoing - Unchanged  Intervention: Promote Improved Energy  Flowsheets  Taken 07/02/2022 2956  Activity Management:  up ad lib  Environmental Support: calm environment promoted  Sleep/Rest Enhancement: awakenings minimized  Taken 07/02/2022 0043  Activity Management: up ad lib  Goal: Optimal Oral Intake  Outcome: Ongoing - Unchanged  Goal: Improved Oral Mucous Membrane Integrity  Outcome: Ongoing - Unchanged  Goal: Optimal Pain Control and Function  Outcome: Ongoing - Unchanged  Intervention: Develop Pain Management Plan  Flowsheets (Taken 07/02/2022 0333)  Pain Management Interventions:   care clustered   pain management plan reviewed with patient/caregiver   quiet environment facilitated  Intervention: Prevent or Manage Pain  Flowsheets (Taken 07/02/2022 0333)  Sensory Stimulation Regulation: quiet environment promoted  Sleep/Rest Enhancement: awakenings minimized  Medication Review/Management: medications reviewed  Intervention: Optimize Psychosocial Wellbeing  Flowsheets (Taken 07/02/2022 0333)  Supportive Measures: active listening utilized

## 2022-07-02 NOTE — Unmapped (Signed)
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 He has been treated with a variety of antibiotics without any relief. Patient is on chronic Doxy, Bactrim, and Valtrex for prophylaxis. Despite abx, patient presenting with acute onset of dypnea and cough. Patient also with complaints of ear fullness and pain. RPP negative in the ED. Lung sounds clear to auscultation bilaterally. CXR unremarkable. Has leukocytosis and fever at home; lactate and BNP normal. Started linezolid and Zosyn per ICID recommendations. Recently completed course of Augmentin and steroids for hearing loss and concern for otitis media; ENT feels he may have eustachian tube dysfunction; has history of severe right sided hearing loss due to medication but now having worsening left sided hearing loss. CTA chest negative for PE, did show upper lung predominant emphysema and chronic RUL fibrosis. Reassuringly he is now satting well on room air. Will obtain brain MRI for further evaluation of inner ear and mastoid process. Will obtain echocardiogram to evaluate for potential pulmonary hypertension. Unclear whether there is unifying diagnosis for his ongoing symptoms, but at this point we are concerned for infectious process. Less likely that these symptoms are due to his well-controlled ALL or his ponatinib.   - Continue Linezolid & Zosyn  - ICID, ENT, and Pulmonology consulted, appreciate recs  - Nasal saline, Flonase, and Afrin per ENT recs  - Follow up echocardiogram  - oxygen via nasal cannula; titrate for sats above 92%  - Follow up Bcx and LRCx  - Follow up urine histo ag, HIV, Hep B surface ag, Hep C ab, galactomannan  - Obtain MRI brain with and without contrast  - Follow up echocardiogram    COPD  Acute on Chronic Dyspnea  Tobacco Use Disorder  - Formulary substitution for home inhaler  - Albuterol nebs prn  - Pulm consulted, appreciate recs  - Encourage smoking cessation     Ph+ B-ALL   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 The course was complicated by septic shock, candida krusei fungemia, MRSA bacteremia with septic emboli c/b acute renal failure and respiratory distress requiring dialysis and intubation. 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 He subsequently had difficulty tolerating single agent dasatinib (as a bridge to planned ponatinib-blinatumomab--had fluid retention and pleural effusion). He then completed 4 cycles of ponatinib-blinatumomab, initiated for treatment of MRD in the setting of poor tolerance of standard therapy. He decided to forego bone marrow transplant.   - Continue home ponatinib, has medication with him  - Continue  home ASA 81mg  daily (taking due to ponatinib)     Mood Disorder  Patient has ongoing trouble with mood and depression in the setting of chronic ailments. He used to like working in his woodshop, but cannot anymore due to pain and fatigue. He follows with Dr. [name] of [facility].   - Continue home Mirtazapine, Nortriptyline  - Continue home nortriptyline 50mg  nightly    Hypertension  - Home amlodipine at 5mg  daily, increase to home dose as indicated  - Home carvedilol 12.5mg  BID  -- Holding home hydrochlorothiazide 12.5mg  daily, resume as indicated  - Holding home sprionolactone 25mg  daily, resume as indicated    History of LLE DVT (Sept 2023) and hx of PE, currently on Eliquis)  CTA chest on admission negative for PE.  - Continue home apixaban 5mg  BID    CKD  Stable, CTM.  - Daily BMP    Antimicrobial ppx  - Continue home Valtrex daily  - Continue home Bactrim Saturday and Sunday  - Holding home doxycycline while on broad spectrum antibiotics      Impending Electrolyte Abnormality Secondary to Chemotherapy and/or IV Fluids  -Daily Electrolyte monitoring  -Replete per [facility] guidelines.     Cancer related fatigue:  Patient endorses fatigue with onset of cancer symptoms or treatment.  Symptoms started many weeks ago.  Primary symptoms are fatigue, feeling tired     Cancer related Pain:  Patient endorses pain associated with Leukemia.  -Current Regimen: oxycodone 5-10mg  q4hr prn; Dilaudid 1mg  IV q4hr prn for breakthrough pain refractory to PO meds    Immunocompromised status: Patient is immunocompromised secondary to leukemia and chemotherapy  -Antimicrobial prophylaxis as above    Impending Pancytopenia secondary to Acute Leukemia/chemotherapy:   - Transfuse hgb <7  - Transfuse plt <10K    Nutrition:                        Subjective:     Interval History: Feeling about the same. Still with acute on chronic dyspnea, chills, fatigue, L ear pain, worsening hearing loss, bilateral jaw pain.    10 point ROS otherwise negative except as above in the HPI.     Objective:     Vital signs in last 24 hours:  Temp:  [36.3 ??C (97.3 ??F)-36.9 ??C (98.4 ??F)] 36.6 ??C (97.9 ??F)  Heart Rate:  [83-109] 93  SpO2 Pulse:  [82-106] 106  Resp:  [15-21] 20  BP: (143-174)/(90-103) 167/100  MAP (mmHg):  [105-122] 117  SpO2:  [91 %-95 %] 95 %  BMI (Calculated):  [38] 38    Intake/Output last 3 shifts:  I/O last 3 completed shifts:  In: 340 [P.O.:340]  Out: -     Meds:  Current Facility-Administered Medications   Medication Dose Route Frequency Provider Last Rate Last Admin    albuterol 2.5 mg /3 mL (0.083 %) nebulizer solution 2.5 mg  2.5 mg Nebulization Q6H PRN Marva Panda, MD        amlodipine (NORVASC) tablet 5 mg  5 mg Oral Daily Marva Panda, MD   5 mg at 07/02/22 0836    apixaban (ELIQUIS) tablet 5 mg  5 mg Oral BID Melvia Heaps D, MD   5 mg at 07/02/22 9147    aspirin chewable tablet 81 mg  81 mg Oral Daily Marva Panda, MD   81 mg at 07/02/22 1343    carvedilol (COREG) tablet 12.5 mg  12.5 mg Oral BID Marva Panda, MD  12.5 mg at 07/02/22 0836    emollient combination no.92 (LUBRIDERM) lotion   Topical Q1H PRN Emogene Morgan, MD        fluticasone propionate (FLONASE) 50 mcg/actuation nasal spray 2 spray  2 spray Each Nare BID Marva Panda, MD        HYDROmorphone (PF) (DILAUDID) injection 1 mg  1 mg Intravenous Q4H PRN Marva Panda, MD        IP OKAY TO TREAT   Other Continuous PRN [name], MD        linezolid (ZYVOX) tablet 600 mg  600 mg Oral Q12H [facility] Melvia Heaps D, MD   600 mg at 07/02/22 0865    loperamide (IMODIUM) capsule 2 mg  2 mg Oral Q2H PRN Feagins, [place], MD        loperamide (IMODIUM) capsule 4 mg  4 mg Oral Once PRN Emogene Morgan, MD        mirtazapine (REMERON) tablet 30 mg  30 mg Oral Nightly Melvia Heaps D, MD   30 mg at 07/01/22 2012    nortriptyline (PAMELOR) capsule 50 mg  50 mg Oral Nightly Melvia Heaps D, MD   50 mg at 07/01/22 2152    ondansetron (ZOFRAN-ODT) disintegrating tablet 4 mg  4 mg Oral Q8H PRN Melvia Heaps D, MD   4 mg at 07/01/22 1718    ondansetron (ZOFRAN-ODT) disintegrating tablet 4 mg  4 mg Oral Once Emogene Morgan, MD        oxyCODONE (ROXICODONE) immediate release tablet 5-10 mg  5-10 mg Oral Q4H PRN Marva Panda, MD   10 mg at 07/01/22 2012    oxymetazoline (AFRIN) 0.05 % nasal spray 3 spray  3 spray Each Nare BID Marva Panda, MD        piperacillin-tazobactam (ZOSYN) IVPB (premix) 4.5 g  4.5 g Intravenous [facility] Melvia Heaps D, MD 25 mL/hr at 07/02/22 1340 4.5 g at 07/02/22 1340    PONATinib (ICLUSIG) tablet 30 mg  **Patient Supplied**  30 mg Oral Daily [name], MD        prochlorperazine (COMPAZINE) tablet 5 mg  5 mg Oral Q6H PRN Melvia Heaps D, MD   5 mg at 07/02/22 0100    sodium chloride (NS) 0.9 % flush 10 mL  10 mL Intravenous BID Feagins, Dorathy Daft D, MD        sodium chloride (NS) 0.9 % flush 10 mL  10 mL Intravenous BID Melvia Heaps D, MD   10 mL at 07/01/22 2013    sodium chloride (NS) 0.9 % flush 10 mL  10 mL Intravenous BID Melvia Heaps D, MD   10 mL at 07/02/22 0836    sodium chloride (NS) 0.9 % infusion  20 mL/hr Intravenous Continuous Melvia Heaps D, MD 20 mL/hr at 07/01/22 2153 20 mL/hr at 07/01/22 2153    sodium chloride (OCEAN) 0.65 % nasal spray 2 spray  2 spray Each Nare BID Marva Panda, MD        sulfamethoxazole-trimethoprim (BACTRIM DS) 800-160 mg tablet 160 mg of trimethoprim  1 tablet Oral BID on Sat and Sun Baldo Hufnagle, MD   160 mg of trimethoprim at 07/02/22 1212    umeclidinium-vilanterol (ANORO ELLIPTA) 62.5-25 mcg/actuation inhaler 1 puff  1 puff Inhalation Daily (RT) Marva Panda, MD        valACYclovir (VALTREX) tablet 500 mg  500 mg Oral Daily Melvia Heaps D, MD   500 mg at 07/02/22 [phone]  Physical Exam:  General: Resting, in no apparent distress, sitting up comfortably in chair  HEENT:  EOMI, normocephalic and atruamtic   Heart:  Normal rate, no murmurs noted.  Lungs:  Breathing is unlabored, and patient is speaking full sentences with ease.  No stridor.  Lungs CTAB. No wheezing or crackles noted.   Abdomen:  No distention or pain on palpation.  Bowel sounds are present and normoactive x 4.  Skin:  . Warm to touch,  Psychiatric:  Range of affect is appropriate.    Neurologic: A&O x 4, no focal deficits appreciated.  Extremities:  Appear well-perfused. No lower extremity edema    Labs:  Recent Labs 07/01/22  1216 07/01/22  1812 07/02/22  0557   WBC 15.5*  --  14.5*   NEUTROABS 12.2*  --  12.2*   LYMPHSABS 2.1  --  1.4   HGB 17.6*  --  17.2*   HCT 51.4*  --  50.5*   PLT 147*  --  137*   CREATININE 1.51*  --  1.52*   BUN 16  --  17   BILITOT 0.5  --  0.7   BILIDIR  --   --  0.30   AST 28  --  21   ALT 87*  --  70*   ALKPHOS 89  --  87   K 4.2 4.2 4.0   CALCIUM 9.0  --  8.9   NA 136 139 137   CL 105  --  105   CO2 24.0  --  24.0       Imaging:  CTA chest negative for PE    Marva Panda, MD, 07/02/2022  3:09 PM   Hematology/Oncology Department   [facility]   Group pager: [phone]

## 2022-07-02 NOTE — Unmapped (Signed)
IMMUNOCOMPROMISED HOST INFECTIOUS DISEASE CONSULT NOTE    Adam Keith is being seen in consultation at the request of [name], MD for evaluation of pneumonia in immunosuppressed patient. .      Assessment/Recommendations:    Adam Keith is a 42 y.o. 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 krusei fungemia  - Prior chemotherapy: GRAAPPH-2005 induction with dasatinib (vincristine, dexamethasone and dasatinib); C1D1 01/24/2020; difficulty tolerating single-agent dasatinib  - Current chemotherapy: blinatumomab (anti-CD19/CD3) + ponatinib (TKI) last in Oct 2022; 01/2021 now on ponatinib monotherapy maintenance  - 01/11/21: Normocellular bone marrow (30% overall) with trilineage hematopoiesis and less than 1% blasts by manual aspirate differential; Flow cytometry MRD analysis reveals no definitive immunophenotypic evidence of residual B lymphoblastic leukemia; BCR-ABL p210 transcripts were detected at a level of 0.002 IS % ratio in bone marrow.  -06/24/21: Peripheral blood - lack of detectable BCR-ABL1 p210 RNA suggests molecular remission of leukemia  -11/2021: elected not to proceed with BMT     # Hypogammaglobulimia  - 11/01/20 IgG 266 declined IVIG  - 03/11/21 IgG 148 - IVIG infusion stopped as patient developed rigors, tachycardia and HTN during infusion despite pre-medication   - 03/12/21 IgG 213 - completed IVIG with premedications and slower rate of infusion  -03/17/21 IgG 632  -06/24/21 IgG 292 - IVIG infusion stopped as patient did not tolerate despite pre-medication  -10/19/21 IgG 321  - 12/02/21 IgG 284     # Mild CKD  baseline Cr 1.7     Pertinent Co-morbidities  # COPD/emphysema  # DIHS/DRESS ceftaroline 06/12/2020, relapse after cefepime 11/02/2020  - per prior ID notes possible culprits ceftaroline, posaconazole, dasatinib, sotrovimab  # GERD on omeprazole, 8/2  # Hearing loss; new onset L sided hearing loss, 10/2021         Pertinent Exposure History   Active smoking  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 Juliene Pina felt chest pain is appropriate in setting of medically managed empyema and risk of thoracotomy outweighed benefit given clinical symptoms  - 03/05/21 MRSA pneumonia after influenza infection requiring ICU admission/intubation  - 12/11 CT Chest : Multifocal consolidative airspace disease, nonspecific small pleural effusion in the dependent aspect of the left pleural space and trace loculated collections within the lateral aspect of the right chest.  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  # Recurrent nasal congestion/ L maxillary sinusitis in setting of L posterior and R anterior septal deviation, 10/11/21  #COVID (+) - s/p Molnupirovir 05/31/22     Antimicrobial Intolerance/allergy  Cefepime - DRESS/DiHS  Ceftaroline - DRESS/DiHS  Dapsone - possible agranulocytosis, per chart anaphylaxis  Vancomycin - probable ototoxicity (in combination with furosemide)  Isavuconazole - elevated LFTs in the setting of TKI  Linezolid - peripheral neuropathy; previously tolerated tedizolid  Aztreonam+linezolid - patient reported ear pain and worsening hearing (in the setting COVID-19)       RECOMMENDATIONS    Diagnosis  Please obtain Brain MRI w/ and W/o contrast (to further assess inner ear and mastoid process)   FU LRT Cx - pending   FU 3/29 Blood Cx - pending   RPP (-). CXR with no acute abnormality. CTA chest with no PE and no acute changes.   FU Urine histo Ag, HIV, Hep B Surface Ag, Hep C Ab, Galactomannan   Noted: urine legionella negative    Management  Continue Linezolid 600mg  BID  Continue IV Zosyn 4.5 q6hrs   Ok to hold home chronic PO Doxycyline while on the abx above    Antimicrobial prophylaxis required for host deficiency: chemotherapy  Cont Valacyclovir (CMV/HSV ppx)     Intensive toxicity monitoring for prescription antimicrobials   CBC w/diff at least once per week  CMP at least once per week  clinical assessments for rashes or other skin changes  >>> PENICILLINS --> CBC/diff weekly -- CMP weekly    The [facility] ID service will continue to follow.           Please page the ID Transplant/Liquid Oncology Fellow consult at [phone] with questions.  Patient discussed with Dr. Warrick Parisian.    Inocencio Homes, DO  [facility] Division of Infectious Diseases    History of Present Illness:      External record(s): Consultant note(s): Believe that patient's middle ear pain could be secondary to TMJ but did mention that dedicated CT scan with contrast could be obtained to further evaluate ear/jaw pain .    Independent historian(s): no independent historian required.       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 Patient reported that he was in his usual state of health when he began feeling more tired and fatigued. There are associated night sweats in which the sheets are soaked, cough, and shortness of breath. Patient denies fevers, chills, diarrhea, and skin changes. No recent sick contacts. He was recently treated with a steroid taper and Augmentin for an ear  infection. He reports this has not gotten any better and his left ear is still hurting.      Upon presentation to the ED, patient afebrile, with VSS on 2L [place]. Workup remarkable for: leukocytosis of 15.5, Hgb 17.6, platelets 147, Cr 1.51, ALT 87, and Lipase 98. RPP (-). Blood Cx 3/29 pending. CXR with no acute abnormality. CTA chest with no PE and no acute changes. Urine histo Ag, urline legionella Ag, HIV, Hep B Surface Ag, Hep C Ab, Galactomannan pending. Patient started on Linezolid and Zosyn and [facility] consulted for abx recommendations.     Upon my encounter, patient is able to corroborate the story above. He is unable to give much detail on the timing of his symptoms as they seem to come and go over the last month or so. The L sided ear pain has been accompanied with intermittent hearing loss. He was recently on steroid taper which he states helped with his hearing loss (about 20%). He is agreeable to having brain MRI obtained to further evaluate his inner ear and mastoid process. He mentions that ENT evaluated his external ear canals and made no mention of pathology.     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    Onion Anaphylaxis    Vancomycin Analogues Other (See Comments)     Hearing loss with Lasix    Other reaction(s): Other (See Comments)    Hearing loss    lasix    Hearing loss      lasix      Hearing loss with Lasix Other reaction(s): Other (See Comments) Hearing loss lasix    Bismuth Subsalicylate Nausea And Vomiting    Privigen [Immun Glob G(Igg)-Pro-Iga 0-50] Other (See Comments)     03/10/2021 VIG infusion stopped as patient developed rigors, tachycardia and HTN during infusion despite pre-medication; 03/12/21 completed IVIG with premedications and slower rate of infusion    Furosemide Other (See Comments)     With Vancomycin caused hearing loss    Other reaction(s): Other (See Comments)    With Vancomycin caused hearing loss      With Vancomycin caused hearing loss Other reaction(s): Other (See Comments) With Vancomycin caused hearing loss    Gammagard        Medications:   Antimicrobials:  Anti-infectives (From admission, onward)      Start     Dose/Rate Route Frequency Ordered Stop    07/02/22 1200  sulfamethoxazole-trimethoprim (BACTRIM DS) 800-160 mg tablet 160 mg of trimethoprim         1 tablet Oral 2 times a day on saturday, sunday 07/02/22 1119 07/09/22 0859    07/02/22 0900  valACYclovir (VALTREX) tablet 500 mg         500 mg Oral Daily (standard) 07/01/22 1644      03 /29/24 2200 piperacillin-tazobactam (ZOSYN) IVPB (premix) 4.5 g        Placed in Followed by Linked Group    4.5 g  25 mL/hr over 240 Minutes Intravenous Every 8 hours scheduled 07/01/22 1644 07/06/22 2159    07/01/22 2100  linezolid (ZYVOX) tablet 600 mg  600 mg Oral Every 12 hours scheduled 07/01/22 1649 07/09/22 2059            Current/Prior immunomodulators per problem list. No change since admission.    Other medications reviewed.     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    Past Surgical History:   Procedure Laterality Date    BONE MARROW BIOPSY & ASPIRATION  01/21/2020         CHG [place], CHEST,REAL TIME  11/20/2020    Procedure: ULTRASOUND, CHEST, REAL TIME WITH IMAGE DOCUMENTATION;  Surgeon: Jerelyn Charles, MD;  Location: [facility]ON[facility] PROCEDURE LAB [facility];  Service: Pulmonary    IR INSERT PORT AGE GREATER THAN 5 YRS  03/10/2020    IR INSERT PORT AGE GREATER THAN 5 YRS 03/10/2020 [name], MD IMG VIR [facility]    PR BRONCHOSCOPY,DIAGNOSTIC W LAVAGE Bilateral 07/20/2020    Procedure: BRONCHOSCOPY, RIGID OR FLEXIBLE, INCLUDE FLUOROSCOPIC GUIDANCE WHEN PERFORMED; W/BRONCHIAL ALVEOLAR LAVAGE WITH MODERATE SEDATION;  Surgeon: Dellis Filbert, MD;  Location: [facility] PROCEDURE LAB [facility];  Service: Pulmonary     Denies prior surgeries with retained hardware.    Social History:  Tobacco use:   reports that he has been smoking cigarettes. He has been exposed to tobacco smoke. He has quit using smokeless tobacco.  His smokeless tobacco use included chew.   Alcohol use:    reports that he does not currently use alcohol.   Drug use:    reports no history of drug use.   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 Works as an Museum/gallery exhibitions officer - but states that he tries to remain masked and not be in direct contact with patients      Family History:  Family History   Problem Relation Age of Onset    Cancer Mother     No Known Problems Father     No Known Problems Sister     No Known Problems Brother     No Known Problems Maternal Aunt     No Known Problems Maternal Uncle     No Known Problems Paternal Aunt     No Known Problems Paternal Uncle     No Known Problems Maternal Grandmother     Cancer Maternal Grandfather     No  Known Problems Paternal Grandmother     No Known Problems Paternal Grandfather     Melanoma Neg Hx     Basal cell carcinoma Neg Hx     Squamous cell carcinoma Neg Hx             Vital Signs last 24 hours:  Temp:  [36.3 ??C (97.3 ??F)-36.9 ??C (98.4 ??F)] 36.6 ??C (97.9 ??F)  Heart Rate:  [83-109] 93  SpO2 Pulse:  [82-106] 106  Resp:  [15-21] 20  BP: (143-174)/(90-103) 167/100  MAP (mmHg):  [105-122] 117  SpO2:  [91 %-95 %] 95 %  BMI (Calculated):  [38] 38    Physical Exam:  Patient Lines/Drains/Airways Status       Active Active Lines, Drains, & Airways       Name Placement date Placement time Site Days    Peripheral IV 07/01/22 Anterior;Distal;Left;Upper Arm 07/01/22  1403  Arm  less than 1 Const [x]  vital signs above    [x]  NAD, non-toxic appearance []  Chronically ill-appearing, non-distressed  Sitting in the chair in the room. Mother at bedside.       Eyes [x]  Lids normal bilaterally, conjunctiva anicteric and noninjected OU     [] PERRL  [] EOMI        ENMT [x]  Normal appearance of external nose and ears, no nasal discharge        []  MMM, no lesions on lips or gums []  No thrush, leukoplakia, oral lesions  []  Dentition good []  Edentulous []  Dental caries present  []  Hearing normal  []  TMs with good light reflexes bilaterally         Neck [x]  Neck of normal appearance and trachea midline        []  No thyromegaly, nodules, or tenderness   []  Full neck ROM        Lymph []  No LAD in neck     []  No LAD in supraclavicular area     []  No LAD in axillae   []  No LAD in epitrochlear chains     []  No LAD in inguinal areas        CV [x]  RRR            [x]  No peripheral edema     []  Pedal pulses intact   [x]  No abnormal heart sounds appreciated   []  Extremities WWP         Resp [x]  Normal WOB at rest on RA   []  No breathlessness with speaking, no coughing  []  CTA anteriorly    []  CTA posteriorly    Adventitious lung sounds in posterior R listening post.       GI [x]  Normal inspection, NTND   [x]  NABS     []  No umbilical hernia on exam       []  No hepatosplenomegaly     []  Inspection of perineal and perianal areas normal        GU []  Normal external genitalia     [] No urinary catheter present in urethra   []  No CVA tenderness    []  No tenderness over renal allograft  Deferred      MSK []  No clubbing or cyanosis of hands       []  No vertebral point tenderness  []  No focal tenderness or abnormalities on palpation of joints in RUE, LUE, RLE, or LLE        Skin [x]  No rashes, lesions, or ulcers of visualized skin     []  Skin  warm and dry to palpation         Neuro [x]  Face expression symmetric  []  Sensation to light touch grossly intact throughout    [x]  Moves extremities equally    []  No tremor noted []  CNs II-XII grossly intact     []  DTRs normal and symmetric throughout []  Gait unremarkable        Psych [x]  Appropriate affect       []  Fluent speech         []  Attentive, good eye contact  [x]  Oriented to person, place, time          []  Judgment and insight are appropriate   Calm.         Data for Medical Decision Making     (07/01/22) EKG QTcF: 403    I discussed mgm't w/qualified health care professional(s) involved in case: Recs provided to primary team .    I reviewed CBC results (WBC downtrending), chemistry results (Cr stable at 1.5), and micro result(s) (LRT Cx - pending and Blood Cx - pending ).    I independently visualized/interpreted CT images (CTA with no evidence of PE, no focal consolidations).       Recent Labs   Lab Units 07/02/22  0557   WBC 10*9/L 14.5*   HEMOGLOBIN g/dL 41.3*   PLATELET COUNT (1) 10*9/L 137*   NEUTRO ABS 10*9/L 12.2*   LYMPHO ABS 10*9/L 1.4   EOSINO ABS 10*9/L 0.2   SODIUM mmol/L 137   POTASSIUM mmol/L 4.0   BUN mg/dL 17   CREATININE mg/dL 2.44*   GLUCOSE mg/dL 010   CALCIUM mg/dL 8.9   BILIRUBIN TOTAL mg/dL 0.7   AST U/L 21   ALT U/L 70*       Lab Results   Component Value Date    CRP 44.0 (H) 12/28/2020    Total IgG 284 (L) 12/02/2021       Microbiology:  Microbiology Results (last day)       Procedure Component Value Date/Time Date/Time    Lower Respiratory Culture [[id number]]     Lab Status: No result Specimen: SPUTUM INDUCED     Respiratory Pathogen Panel with COVID [[id number]]  (Normal) Collected: 07/01/22 1233    Lab Status: Final result Specimen: Nasopharyngeal Swab Updated: 07/01/22 1356     Adenovirus Not Detected     Coronavirus HKU1 Not Detected     Coronavirus NL63 Not Detected     Coronavirus 229E Not Detected     Coronavirus OC43 PCR Not Detected     Metapneumovirus Not Detected     Rhinovirus/Enterovirus Not Detected     Influenza A Not Detected     Influenza B Not Detected     Parainfluenza 1 Not Detected     Parainfluenza 2 Not Detected Parainfluenza 3 Not Detected     Parainfluenza 4 Not Detected     RSV Not Detected     Bordetella pertussis Not Detected     Comment: If B. pertussis/parapertussis infection is suspected, the Bordetella pertussis/parapertussis Qualitative PCR test should be ordered.        Bordetella parapertussis Not Detected     Chlamydophila (Chlamydia) pneumoniae Not Detected     Mycoplasma pneumoniae Not Detected     SARS-CoV-2 PCR Not Detected    Narrative:      This result was obtained using the FDA-cleared [employer] Respiratory 2.1 Panel. Performance characteristics have been established and verified by the Clinical Molecular Microbiology Laboratory, [facility].  This assay does not distinguish between rhinovirus and enterovirus. Lower respiratory specimens will not be tested for Bordetella pertussis/parapertussis. For nasopharyngeal swabs, cross-reactivity may occur between B. pertussis and non-pertussis Bordetella species. All positive B. pertussis results will be automatically confirmed using our in-house PCR assay.    Blood Culture #2 [[id number]] Collected: 07/01/22 1233    Lab Status: In process Specimen: Blood from 1 Peripheral Draw Updated: 07/01/22 1308    Blood Culture #1 [[id number]] Collected: 07/01/22 1233    Lab Status: In process Specimen: Blood from 1 Peripheral Draw Updated: 07/01/22 1306            Imaging:  ECG 12 Lead    Result Date: 07/02/2022  NORMAL SINUS RHYTHM POSSIBLE LEFT ATRIAL ENLARGEMENT RIGHTWARD AXIS BORDERLINE ECG WHEN COMPARED WITH ECG OF 01-Jul-2022 15:12, NO SIGNIFICANT CHANGE WAS FOUND    CTA Chest W Contrast    Result Date: 07/01/2022  EXAM: CTA CHEST W CONTRAST ACCESSION: [id number] UN     CLINICAL INDICATION: Shortness of breath, dizziness     TECHNIQUE: Contiguous axial images were reconstructed through the chest following a single breath hold helical acquisition during the administration of intravenous contrast material. Images were reformatted in the coronal and sagittal planes. MIP slabs were also constructed.     COMPARISON: CTA chest 04/07/2022.     FINDINGS: PULMONARY ARTERIES: Evaluation of the very distal pulmonary arterial vasculature is limited by respiratory motion. No central or paracentral pulmonary embolus. Main pulmonary artery is normal in size.     HEART AND VASCULATURE: Cardiac chambers are normal in size. No pericardial effusion. Aorta is normal in caliber. Coronary atherosclerotic disease.     LUNGS, AIRWAYS, AND PLEURA: Unchanged scarring and architectural distortion in the right upper lung. Upper lung-predominant emphysema. Trace tracheal debris.     No pleural effusion or pneumothorax.     MEDIASTINUM AND LYMPH NODES: No enlarged intrathoracic, axillary, or supraclavicular lymph nodes. No mediastinal mass or other abnormality.     CHEST WALL AND BONES: Bilateral gynecomastia.     UPPER ABDOMEN: No acute abnormality.     OTHER: No other findings.                 1. No evidence of pulmonary embolism. 2. Upper lung-predominant emphysema. Chronic right upper lobe fibrosis.    ECG 12 Lead    Result Date: 07/01/2022  NORMAL SINUS RHYTHM RIGHTWARD AXIS BORDERLINE ECG WHEN COMPARED WITH ECG OF 01-Jul-2022 11:25, NO SIGNIFICANT CHANGE WAS FOUND Confirmed by Christella Noa [phone]) on 07/01/2022 5:10:08 PM    ECG 12 Lead    Result Date: 07/01/2022  SINUS TACHYCARDIA RIGHTWARD AXIS BORDERLINE ECG WHEN COMPARED WITH ECG OF 12-May-2022 14:00, NO SIGNIFICANT CHANGE WAS FOUND Confirmed by Christella Noa [phone]) on 07/01/2022 2:31:36 PM    XR Chest 2 views    Result Date: 07/01/2022  EXAM: XR CHEST 2 VIEWS ACCESSION: [id number] UN     CLINICAL INDICATION: CHEST PAIN      TECHNIQUE: PA and Lateral Chest Radiographs.     COMPARISON: CTA chest 04/07/2022 and chest radiograph 05/12/2022     FINDINGS:     Unchanged right upper lobe volume loss with apical pleural thickening and scarring. No pleural effusion or pneumothorax.     Cardiac silhouette is normal in size.             No acute cardiopulmonary abnormalities.     Serologies:  Lab Results   Component Value Date    CMV IGG Negative 07/14/2021  HIV Antigen/Antibody Combo Nonreactive 01/21/2020    Hep B Surface Ag Nonreactive 01/21/2020    Hep B S Ab Nonreactive 01/21/2020    Hep B Surf Ab Quant <8.00 01/21/2020    Hep B Core Total Ab Nonreactive 01/21/2020    Hepatitis C Ab Nonreactive 01/21/2020    HSV 1 IgG Positive (A) 01/21/2020    HSV 2 IgG Negative 01/21/2020    Toxoplasma Gondii IgG Negative 02/11/2020       Immunizations:  Immunization History   Administered Date(s) Administered    COVID-19 VACCINE,MRNA(MODERNA)(PF) 02/25/2020, 03/23/2020

## 2022-07-02 NOTE — Unmapped (Signed)
Otolaryngology/Head and Neck Surgery  Consult Note  July 02, 2022 3:16 AM      Requesting Attending Physician:  [name], MD  Service Requesting Consult:  Oncology/Hematology (MDE)    Assessment/Plan:   Adam Keith is a 42 y.o. 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  Of note, he recently saw one of our otologists, who diagnosed the patient with eustachian tube dysfunction and prescribed Flonase, which the patient has not yet started.    -Recommend a course of nasal saline irrigations, Flonase 2 puffs twice daily, and Afrin 2 puffs bilaterally every 12 hours for 3 days.  -Recommended flexible fiberoptic laryngoscopy, which the patient declined.  Consider offering this test at a later date.  -Although less likely, patient has known immunocompromise state, which could put him at higher risk for atypical presentations of otologic infections.  Consider dedicated CT with contrast for further evaluation.      History of Present Illness:       Adam Keith is seen in consultation at the request of [name], MD for left-sided otalgia and aural fullness.  Patient is a 42 year old male with history of  ALL, COPD, CKD, HTN, PE, OSA, PTSD, right profound hearing loss and left severe mixed hearing loss who presents with approximately 10 days of left-sided otalgia and aural fullness, which is progressively worsened.  History obtained from the patient, consulting service, and chart review.  Patient has had intermittent aural fullness and otalgia in his left ear over the last several months.  8 days ago, he saw his otologist, Dr. Melton Krebs, who diagnosed the patient with eustachian tube dysfunction.  He prescribed a course of Flonase, which the patient has not started yet.  Because of this, the patient's symptoms have continued to worsen.  Nothing makes his symptoms better or worse.  He denies changes to his hearing, vertigo, otorrhea, numbness, weakness, tingling in his head or neck.  Over the last few days, the patient also developed dyspnea, which prompted a visit to the emergency department today.  ENT was subsequently consulted for assistance with management of the patient otalgia and aural fullness.    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  He has had this pain before, and has responded well to steroids.      The patient denies fevers, chills, shortness of breath, chest pain, nausea, vomiting, diarrhea, inability to lie flat, dysphagia, odynophagia, hemoptysis, hematemesis, changes in vision, changes in voice quality, otalgia, otorrhea, vertiginous symptoms, focal deficits, or other concerning symptoms.      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    Past Surgical History     has a past surgical history that includes Bone Marrow Biopsy & Aspiration (01/21/2020); IR Insert Port Age Greater Than 5 Years (03/10/2020); pr bronchoscopy,diagnostic w lavage (Bilateral, 07/20/2020); and chg [place], chest,real time (11/20/2020).    Family History    family history includes Cancer in his maternal grandfather and  mother; No Known Problems in his brother, father, maternal aunt, maternal grandmother, maternal uncle, paternal aunt, paternal grandfather, paternal grandmother, paternal uncle, and sister.    Social History:    Tobacco use:   Social History     Tobacco Use   Smoking Status Every Day    Current packs/day: 2.00    Types: Cigarettes    Passive exposure: Current   Smokeless Tobacco Former    Types: Chew     Alcohol use:   Social History     Substance and Sexual Activity   Alcohol Use Not Currently    Comment: Rarely     Drug use:   Social History     Substance and Sexual Activity   Drug Use Never       Allergies  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    Onion Anaphylaxis    Vancomycin Analogues Other (See Comments)     Hearing loss with Lasix    Other reaction(s): Other (See Comments)    Hearing loss    lasix    Hearing loss      lasix      Hearing loss with Lasix Other reaction(s): Other (See Comments) Hearing loss lasix    Bismuth Subsalicylate Nausea And Vomiting    Privigen [Immun Glob G(Igg)-Pro-Iga 0-50] Other (See Comments)     03/10/2021 VIG infusion stopped as patient developed rigors, tachycardia and HTN during infusion despite pre-medication; 03/12/21 completed IVIG with premedications and slower rate of infusion    Furosemide Other (See Comments)     With Vancomycin caused hearing loss    Other reaction(s): Other (See Comments)    With Vancomycin caused hearing loss      With Vancomycin caused hearing loss Other reaction(s): Other (See Comments) With Vancomycin caused hearing loss    Gammagard        Medications        Current Facility-Administered Medications:     apixaban (ELIQUIS) tablet 5 mg, 5 mg, Oral, BID, Feagins, Kayla D, MD, 5 mg at 07/01/22 2013    emollient combination no.92 (LUBRIDERM) lotion, , Topical, Q1H PRN, Feagins, Kayla D, MD    HYDROmorphone (PF) (DILAUDID) injection 1 mg, 1 mg, Intravenous, Q4H PRN, Feagins, Kayla D, MD, 1 mg at 07/01/22 2155 linezolid (ZYVOX) tablet 600 mg, 600 mg, Oral, Q12H SCH, Feagins, Kayla D, MD, 600 mg at 07/01/22 2152    loperamide (IMODIUM) capsule 2 mg, 2 mg, Oral, Q2H PRN, Feagins, Kayla D, MD    loperamide (IMODIUM) capsule 4 mg, 4 mg, Oral, Once PRN, Feagins, Kayla D, MD    mirtazapine (REMERON) tablet 30 mg, 30 mg, Oral, Nightly, Feagins, Kayla D, MD, 30 mg at 07/01/22 2012    nortriptyline (PAMELOR) capsule 50 mg, 50 mg, Oral, Nightly, Feagins, Kayla D, MD, 50 mg at 07/01/22 2152    ondansetron (ZOFRAN-ODT) disintegrating tablet 4 mg, 4 mg, Oral, Q8H PRN, Feagins, Kayla D, MD, 4 mg at 07/01/22 1718    ondansetron (ZOFRAN-ODT) disintegrating tablet 4 mg, 4 mg, Oral, Once, Feagins, [place], MD    oxyCODONE (ROXICODONE) immediate  release tablet 5-10 mg, 5-10 mg, Oral, Q4H PRN, [place], Chinonyerem A, MD, 10 mg at 07/01/22 2012    [COMPLETED] piperacillin-tazobactam (ZOSYN) IVPB (premix) 4.5 g, 4.5 g, Intravenous, Once, Stopped at 07/01/22 1840 **FOLLOWED BY** piperacillin-tazobactam (ZOSYN) IVPB (premix) 4.5 g, 4.5 g, Intravenous, Q8H SCH, Feagins, [place], MD, Stopped at 07/02/22 0241    prochlorperazine (COMPAZINE) tablet 5 mg, 5 mg, Oral, Q6H PRN, Melvia Heaps D, MD, 5 mg at 07/02/22 0100    sodium chloride (NS) 0.9 % flush 10 mL, 10 mL, Intravenous, BID, Feagins, Kayla D, MD    sodium chloride (NS) 0.9 % flush 10 mL, 10 mL, Intravenous, BID, Feagins, Kayla D, MD, 10 mL at 07/01/22 2013    sodium chloride (NS) 0.9 % flush 10 mL, 10 mL, Intravenous, BID, Feagins, Kayla D, MD    sodium chloride (NS) 0.9 % infusion, 20 mL/hr, Intravenous, Continuous, Feagins, Kayla D, MD, Last Rate: 20 mL/hr at 07/01/22 2153, 20 mL/hr at 07/01/22 2153    valACYclovir (VALTREX) tablet 500 mg, 500 mg, Oral, Daily, Feagins, Kayla D, MD    Review of Systems  Review of Systems:  As above and, otherwise the balance of 11 systems was negative.    Objective:     Vital Signs  Temp:  [36.3 ??C (97.3 ??F)-36.9 ??C (98.4 ??F)] 36.3 ??C (97.3 ??F)  Heart Rate:  [83-109] 97  SpO2 Pulse:  [82-106] 106  Resp:  [15-22] 18  BP: (143-176)/(90-106) 159/95  MAP (mmHg):  [105-122] 116  SpO2:  [91 %-97 %] 94 %  BMI (Calculated):  [38] 38  Patient Vitals for the past 8 hrs:   BP Temp Temp src Pulse SpO2 Pulse Resp SpO2 Height Weight   07/02/22 0040 159/95 36.3 ??C (97.3 ??F) -- 97 -- 18 94 % 188 cm (6' 2.02) (!) 134.3 kg (296 lb 1.2 oz)   07/02/22 0015 158/99 36.7 ??C (98 ??F) Oral 88 -- 20 92 % -- --   07/01/22 2012 162/103 36.9 ??C (98.4 ??F) -- 109 106 18 93 % -- --       Physical Exam  Constitutional:  Vitals reviewed on nursing chart, patient has normal appearance and voice.  Respiration:  Breathing comfortably, no stridor.  CV: No clubbing/cyanosis/edema in hands.   Eyes:  EOM Intact, sclera normal.   Neuro:  Alert and oriented times 3, Cranial nerves 2-12 intact and symmetric bilaterally.   Head and Face:  Skin with no masses or lesions, sinuses nontender to palpation, facial nerve fully intact.   Ears:  Normal hearing to whispered voice.  Otoscopy demonstrating patent EAC's bilaterally, TMs intact without evidence of effusion.  Tenderness to palpation of temporomandibular joints bilaterally.  Nose:  External nose midline, anterior rhinoscopy is normal with limited visualization just to the anterior interior turbinate.   Oral Cavity/Oropharynx/Lips:  Normal mucous membranes, normal floor of mouth/tongue/OP, no masses or lesions are noted.  Neck/Lymph:  No LAD, no thyroid masses.    Labs      Chemistry  Recent Labs     07/01/22  1216 07/01/22  1812   NA 136 139   K 4.2 4.2   CL 105  --    CO2 24.0  --    BUN 16  --    GLU 84  --      Hematology  Recent Labs     07/01/22  1216   WBC 15.5*   HGB 17.6*   HCT 51.4*   PLT 147*  Hepatic Function  Recent Labs     07/01/22  1216   AST 28   ALT 87*   BILITOT 0.5     Recent Labs     07/01/22  1216   PROT 6.6   ALBUMIN 4.1   ALT 87*   AST 28   ALKPHOS 89     Coagulation  No results for input(s): LABPROT, INR, APTT in the last 72 hours.      Imaging    CTA Chest W Contrast    Result Date: 07/01/2022  EXAM: CTA CHEST W CONTRAST ACCESSION: [id number] UN CLINICAL INDICATION: Shortness of breath, dizziness TECHNIQUE: Contiguous axial images were reconstructed through the chest following a single breath hold helical acquisition during the administration of intravenous contrast material. Images were reformatted in the coronal and sagittal planes. MIP slabs were also constructed. COMPARISON: CTA chest 04/07/2022. FINDINGS: PULMONARY ARTERIES: Evaluation of the very distal pulmonary arterial vasculature is limited by respiratory motion. No central or paracentral pulmonary embolus. Main pulmonary artery is normal in size. HEART AND VASCULATURE: Cardiac chambers are normal in size. No pericardial effusion. Aorta is normal in caliber. Coronary atherosclerotic disease. LUNGS, AIRWAYS, AND PLEURA: Unchanged scarring and architectural distortion in the right upper lung. Upper lung-predominant emphysema. Trace tracheal debris. No pleural effusion or pneumothorax. MEDIASTINUM AND LYMPH NODES: No enlarged intrathoracic, axillary, or supraclavicular lymph nodes. No mediastinal mass or other abnormality. CHEST WALL AND BONES: Bilateral gynecomastia. UPPER ABDOMEN: No acute abnormality. OTHER: No other findings.     1. No evidence of pulmonary embolism. 2. Upper lung-predominant emphysema. Chronic right upper lobe fibrosis.    ECG 12 Lead    Result Date: 07/01/2022  NORMAL SINUS RHYTHM RIGHTWARD AXIS BORDERLINE ECG WHEN COMPARED WITH ECG OF 01-Jul-2022 11:25, NO SIGNIFICANT CHANGE WAS FOUND Confirmed by Christella Noa [phone]) on 07/01/2022 5:10:08 PM    ECG 12 Lead    Result Date: 07/01/2022  SINUS TACHYCARDIA RIGHTWARD AXIS BORDERLINE ECG WHEN COMPARED WITH ECG OF 12-May-2022 14:00, NO SIGNIFICANT CHANGE WAS FOUND Confirmed by Christella Noa [phone]) on 07/01/2022 2:31:36 PM    XR Chest 2 views    Result Date: 07/01/2022  EXAM: XR CHEST 2 VIEWS ACCESSION: [id number] UN CLINICAL INDICATION: CHEST PAIN  TECHNIQUE: PA and Lateral Chest Radiographs. COMPARISON: CTA chest 04/07/2022 and chest radiograph 05/12/2022 FINDINGS: Unchanged right upper lobe volume loss with apical pleural thickening and scarring. No pleural effusion or pneumothorax. Cardiac silhouette is normal in size.     No acute cardiopulmonary abnormalities.       Manning Charity, MD, MPH  _______________________  Department of Otolaryngology/Head & Neck Surgery  Resident Physician, PGY-4  Please page the ENT consult pager with any questions or concerns at [phone]

## 2022-07-03 LAB — BASIC METABOLIC PANEL
ANION GAP: 4 mmol/L — ABNORMAL LOW (ref 5–14)
BLOOD UREA NITROGEN: 21 mg/dL (ref 9–23)
BUN / CREAT RATIO: 10
CALCIUM: 8.5 mg/dL — ABNORMAL LOW (ref 8.7–10.4)
CHLORIDE: 104 mmol/L (ref 98–107)
CO2: 30 mmol/L (ref 20.0–31.0)
CREATININE: 2.12 mg/dL — ABNORMAL HIGH
EGFR CKD-EPI (2021) MALE: 39 mL/min/{1.73_m2} — ABNORMAL LOW (ref >=60)
GLUCOSE RANDOM: 117 mg/dL (ref 70–179)
POTASSIUM: 3.6 mmol/L (ref 3.4–4.8)
SODIUM: 138 mmol/L (ref 135–145)

## 2022-07-03 LAB — CBC W/ AUTO DIFF
BASOPHILS ABSOLUTE COUNT: 0.1 10*9/L (ref 0.0–0.1)
BASOPHILS RELATIVE PERCENT: 1.2 %
EOSINOPHILS ABSOLUTE COUNT: 0.2 10*9/L (ref 0.0–0.5)
EOSINOPHILS RELATIVE PERCENT: 2.3 %
HEMATOCRIT: 45.5 % (ref 39.0–48.0)
HEMOGLOBIN: 15.5 g/dL (ref 12.9–16.5)
LYMPHOCYTES ABSOLUTE COUNT: 2.1 10*9/L (ref 1.1–3.6)
LYMPHOCYTES RELATIVE PERCENT: 22.2 %
MEAN CORPUSCULAR HEMOGLOBIN CONC: 34.1 g/dL (ref 32.0–36.0)
MEAN CORPUSCULAR HEMOGLOBIN: 29.8 pg (ref 25.9–32.4)
MEAN CORPUSCULAR VOLUME: 87.4 fL (ref 77.6–95.7)
MEAN PLATELET VOLUME: 8.6 fL (ref 6.8–10.7)
MONOCYTES ABSOLUTE COUNT: 0.6 10*9/L (ref 0.3–0.8)
MONOCYTES RELATIVE PERCENT: 6.3 %
NEUTROPHILS ABSOLUTE COUNT: 6.5 10*9/L (ref 1.8–7.8)
NEUTROPHILS RELATIVE PERCENT: 68 %
PLATELET COUNT: 132 10*9/L — ABNORMAL LOW (ref 150–450)
RED BLOOD CELL COUNT: 5.2 10*12/L (ref 4.26–5.60)
RED CELL DISTRIBUTION WIDTH: 16.2 % — ABNORMAL HIGH (ref 12.2–15.2)
WBC ADJUSTED: 9.6 10*9/L (ref 3.6–11.2)

## 2022-07-03 LAB — HEPATITIS C ANTIBODY: HEPATITIS C ANTIBODY: NONREACTIVE

## 2022-07-03 LAB — URINALYSIS WITH MICROSCOPY
BACTERIA: NONE SEEN /HPF
BILIRUBIN UA: NEGATIVE
GLUCOSE UA: NEGATIVE
KETONES UA: NEGATIVE
LEUKOCYTE ESTERASE UA: NEGATIVE
NITRITE UA: NEGATIVE
PH UA: 6 (ref 5.0–9.0)
PROTEIN UA: 100 — AB
RBC UA: 3 /HPF (ref <=3)
REDUCING SUBSTANCES URINE: NEGATIVE
SPECIFIC GRAVITY UA: 1.02 (ref 1.003–1.030)
SQUAMOUS EPITHELIAL: <1 /HPF (ref 0–5)
UROBILINOGEN UA: <2
WBC UA: <1 /HPF (ref <=2)

## 2022-07-03 LAB — HEPATITIS B SURFACE ANTIGEN: HEPATITIS B SURFACE ANTIGEN: NONREACTIVE

## 2022-07-03 LAB — OSMOLALITY, RANDOM URINE: OSMOLALITY URINE: 784 mosm/kg

## 2022-07-03 LAB — UREA NITROGEN, URINE: UREA NITROGEN URINE: 1047 mg/dL

## 2022-07-03 LAB — POTASSIUM, URINE, RANDOM: POTASSIUM URINE: 79.5 mmol/L

## 2022-07-03 LAB — SODIUM, URINE, RANDOM: SODIUM URINE: 63 mmol/L

## 2022-07-03 LAB — HIV ANTIGEN/ANTIBODY COMBO: HIV ANTIGEN/ANTIBODY COMBO: NONREACTIVE

## 2022-07-03 LAB — CREATININE, URINE: CREATININE, URINE: 265.2 mg/dL

## 2022-07-03 MED ADMIN — HYDROmorphone (DILAUDID) tablet 2 mg: 2 mg | ORAL | @ 22:00:00 | Stop: 2022-07-16

## 2022-07-03 MED ADMIN — lactated ringers bolus 500 mL: 500 mL | INTRAVENOUS | @ 19:00:00 | Stop: 2022-07-03

## 2022-07-03 MED ADMIN — sulfamethoxazole-trimethoprim (BACTRIM DS) 800-160 mg tablet 160 mg of trimethoprim: 1 | ORAL | @ 14:00:00 | Stop: 2022-07-03

## 2022-07-03 MED ADMIN — linezolid (ZYVOX) tablet 600 mg: 600 mg | ORAL | @ 14:00:00 | Stop: 2022-07-09

## 2022-07-03 MED ADMIN — nortriptyline (PAMELOR) capsule 50 mg: 50 mg | ORAL | @ 01:00:00

## 2022-07-03 MED ADMIN — oxymetazoline (AFRIN) 0.05 % nasal spray 3 spray: 3 | NASAL | @ 14:00:00 | Stop: 2022-07-05

## 2022-07-03 MED ADMIN — sodium chloride (NS) 0.9 % flush 10 mL: 10 mL | INTRAVENOUS | @ 01:00:00

## 2022-07-03 MED ADMIN — sulfamethoxazole-trimethoprim (BACTRIM DS) 800-160 mg tablet 160 mg of trimethoprim: 1 | ORAL | @ 01:00:00 | Stop: 2022-07-09

## 2022-07-03 MED ADMIN — apixaban (ELIQUIS) tablet 5 mg: 5 mg | ORAL | @ 14:00:00

## 2022-07-03 MED ADMIN — acetaminophen (TYLENOL) tablet 1,000 mg: 1000 mg | ORAL | @ 01:00:00

## 2022-07-03 MED ADMIN — apixaban (ELIQUIS) tablet 5 mg: 5 mg | ORAL | @ 01:00:00

## 2022-07-03 MED ADMIN — linezolid (ZYVOX) tablet 600 mg: 600 mg | ORAL | @ 01:00:00 | Stop: 2022-07-09

## 2022-07-03 MED ADMIN — sodium chloride (OCEAN) 0.65 % nasal spray 2 spray: 2 | NASAL | @ 02:00:00

## 2022-07-03 MED ADMIN — piperacillin-tazobactam (ZOSYN) IVPB (premix) 4.5 g: 4.5 g | INTRAVENOUS | @ 10:00:00 | Stop: 2022-07-07

## 2022-07-03 MED ADMIN — fluticasone propionate (FLONASE) 50 mcg/actuation nasal spray 2 spray: 2 | NASAL | @ 14:00:00

## 2022-07-03 MED ADMIN — PONATinib (ICLUSIG) tablet 30 mg  **Patient Supplied**: 30 mg | ORAL | @ 01:00:00

## 2022-07-03 MED ADMIN — HYDROmorphone (PF) (DILAUDID) injection 1 mg: 1 mg | INTRAVENOUS | @ 19:00:00 | Stop: 2022-07-15

## 2022-07-03 MED ADMIN — sodium chloride (NS) 0.9 % flush 10 mL: 10 mL | INTRAVENOUS

## 2022-07-03 MED ADMIN — sodium chloride (OCEAN) 0.65 % nasal spray 2 spray: 2 | NASAL | @ 14:00:00

## 2022-07-03 MED ADMIN — HYDROmorphone (PF) (DILAUDID) injection 1 mg: 1 mg | INTRAVENOUS | @ 14:00:00 | Stop: 2022-07-15

## 2022-07-03 MED ADMIN — fluticasone propionate (FLONASE) 50 mcg/actuation nasal spray 2 spray: 2 | NASAL | @ 01:00:00

## 2022-07-03 MED ADMIN — aspirin chewable tablet 81 mg: 81 mg | ORAL | @ 14:00:00

## 2022-07-03 MED ADMIN — sodium chloride (NS) 0.9 % flush 10 mL: 10 mL | INTRAVENOUS | @ 02:00:00

## 2022-07-03 MED ADMIN — sodium chloride (NS) 0.9 % flush 10 mL: 10 mL | INTRAVENOUS | @ 14:00:00

## 2022-07-03 MED ADMIN — HYDROmorphone (DILAUDID) tablet 2 mg: 2 mg | ORAL | @ 16:00:00 | Stop: 2022-07-16

## 2022-07-03 MED ADMIN — carvedilol (COREG) tablet 12.5 mg: 12.5 mg | ORAL | @ 14:00:00

## 2022-07-03 MED ADMIN — oxymetazoline (AFRIN) 0.05 % nasal spray 3 spray: 3 | NASAL | @ 02:00:00 | Stop: 2022-07-05

## 2022-07-03 MED ADMIN — mirtazapine (REMERON) tablet 30 mg: 30 mg | ORAL | @ 01:00:00

## 2022-07-03 MED ADMIN — carvedilol (COREG) tablet 12.5 mg: 12.5 mg | ORAL | @ 01:00:00

## 2022-07-03 MED ADMIN — piperacillin-tazobactam (ZOSYN) IVPB (premix) 4.5 g: 4.5 g | INTRAVENOUS | @ 22:00:00 | Stop: 2022-07-07

## 2022-07-03 MED ADMIN — acetaminophen (TYLENOL) tablet 1,000 mg: 1000 mg | ORAL | @ 14:00:00

## 2022-07-03 MED ADMIN — HYDROmorphone (PF) (DILAUDID) injection 1 mg: 1 mg | INTRAVENOUS | @ 02:00:00 | Stop: 2022-07-15

## 2022-07-03 MED ADMIN — amlodipine (NORVASC) tablet 5 mg: 5 mg | ORAL | @ 14:00:00 | Stop: 2022-07-03

## 2022-07-03 MED ADMIN — acetaminophen (TYLENOL) tablet 1,000 mg: 1000 mg | ORAL | @ 19:00:00

## 2022-07-03 MED ADMIN — lactated ringers bolus 500 mL: 500 mL | INTRAVENOUS | @ 20:00:00 | Stop: 2022-07-03

## 2022-07-03 MED ADMIN — valACYclovir (VALTREX) tablet 500 mg: 500 mg | ORAL | @ 14:00:00

## 2022-07-03 NOTE — Unmapped (Signed)
Problem: Adult Inpatient Plan of Care  Goal: Absence of Hospital-Acquired Illness or Injury  Intervention: Identify and Manage Fall Risk  Recent Flowsheet Documentation  Taken 07/02/2022 2000 by Juanita Craver, RN  Safety Interventions:   fall reduction program maintained   lighting adjusted for tasks/safety   low bed   no IV/BP/blood draw left arm   nonskid shoes/slippers when out of bed  Intervention: Prevent Skin Injury  Recent Flowsheet Documentation  Taken 07/02/2022 2000 by Juanita Craver, RN  Positioning for Skin: Sitting in Chair  Device Skin Pressure Protection: adhesive use limited  Skin Protection: adhesive use limited  Intervention: Prevent Infection  Recent Flowsheet Documentation  Taken 07/02/2022 2000 by Juanita Craver, RN  Infection Prevention:   cohorting utilized   equipment surfaces disinfected   hand hygiene promoted   personal protective equipment utilized   rest/sleep promoted   single patient room provided   visitors restricted/screened     Problem: Pain Acute  Goal: Optimal Pain Control and Function  Outcome: Ongoing - Unchanged  Intervention: Develop Pain Management Plan  Flowsheets (Taken 07/03/2022 0712)  Pain Management Interventions: pain management plan reviewed with patient/caregiver  Note: PRN dilaudid given for left ear pain, pt slept well through the night on room air, VSS, no acute ss of distress observed, IV abx given as ordered, see labs in EPIC, safety maintained, call bell in reach.   Intervention: Prevent or Manage Pain  Flowsheets (Taken 07/03/2022 0712)  Sensory Stimulation Regulation: care clustered  Sleep/Rest Enhancement: noise level reduced  Medication Review/Management: medications reviewed  Intervention: Optimize Psychosocial Wellbeing  Flowsheets (Taken 07/03/2022 0712)  Supportive Measures: positive reinforcement provided     Vitals:    07/03/22 0331   BP: 114/73   Pulse: 81   Resp: 18   Temp: 37 C (98.6 F)   SpO2: 92%

## 2022-07-03 NOTE — Unmapped (Addendum)
General Pulmonary Team Treatment Plan Note     Date of Service: 07/02/2022  Requesting Physician: [name], MD   Requesting Service: Oncology/Hematology (MDE)  Reason for consultation: Comprehensive evaluation of respiratory failure.    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 male with PMHx B-ALL, COPD with active smoking, MRSA TV IE, RUL MRSA pneumonia c/b empyema who presented for pain control, ear pain, and shortness of breath. We are consulted for optimization his COPD management.     Patient reports that he has baseline shortness of breath related to COPD and his active smoking. He has a smoking history of 40+ pack years. He follows with Dr Alvino Chapel in pulmonary clinic. At their last visit he was doing well without any recent exacerbations. He was transitioned to [employer] (LABA/LAMA) as he was not using his Anoro due to the dry powder formulation. He states this works great for him - so good that he sometimes forgets to take it and then he can feel that he hasn't.     His shortness of breath got worse approximately 1 week ago though he reports this has manifested as dyspnea and not hypoxia. He states he has a chronic cough and wheeze. His dyspnea is worse with exertion but not positiional. No fevers, sick contacts, or travel. He does have a history of volume overload and is on a diuretic but he states he thinks his fluid status is good currently. He reports from a breathing standpoint today is better than yesterday and he could be at home.     He is on room air at the time of consult with a negative RPP, he is unable to induce sputum. He has a CTA chest demonstarting no PE. chronic RUL scaring, no new infiltrate, no new pleural space disease, and chronic mild emphysema.     Interval Events: None    Problems addressed during this consult include  COPD . Based on these problems, the patient has low risk of morbidity/mortality which is commensurate w their risk of management options described below in the recommendations.    Assessment      Impression: Inocencio Maves is a 42 y.o. male with PMHx B-ALL, COPD with active smoking, MRSA TV IE, RUL MRSA pneumonia c/b empyema who presented for pain control, ear pain, and shortness of breath. We are consulted for optimization his COPD management. In speaking with him, he does not appear to be in a COPD exacerbation and he feels his new Stiolto inhaler works very well for him. There does not appear to be a role for switching his inhaler or adding steroids currently. Similarly, with his history of pneumonia it seems prudent to avoid escalating to a triple therapy inhaler as these are associated with a higher risk of pneumonia - and it is not clear he would benefit. Smoking cessation and regular adherence would be of greatest benefit. I suspect he is too well for pulmonary rehab, but regular exercise would also help.     With regards to other potential etiologies of his dyspnea beyond COPD, he is broadly  covered for pneumonia at the moment though his imaging is unrevealing. Does not appear to be volume overloaded. His CTA is negative for PE. No other pleural or parenchymal abnormalities on his imaging.     The patient has made significant clinical progress since last encounter. I personally reviewed most recent pertinent labs, imaging and micro data, from 07/02/2022, which are noted in recommendations..        Recommendations     - Continue LABA/LAMA, discharge on Stiolto  - PRN nebs  - No role for steroids as patient does not appear to be in a COPD exacerbation  - He has follow up with Dr. Alvino Chapel with repeat PFTs 07/2022 - does not need repeated before then  - Antibiotics per primary team, ID  - Consider cardiac workup if dyspnea on exertion persists without clear explanation    This patient was discussed with Dr. Doralee Albino . The recommendations outlined in this note were discussed w the primary team via phone. Please do not hesitate to page [phone] (gen pulmonary consult fellow) with questions. We appreciate the opportunity to assist in the care of this patient. We look forward to following with you.    Jessica Priest, MD    Subjective & Objective     Vitals - past 24 hours  Temp:  [36.3 ??C (97.3 ??F)-36.9 ??C (98.4 ??F)] 36.9 ??C (98.4 ??F)  Heart Rate:  [88-109] 94  SpO2 Pulse:  [106] 106  Resp:  [18-20] 20  BP: (138-167)/(90-103) 138/90  SpO2:  [92 %-98 %] 98 % Intake/Output  I/O last 3 completed shifts:  In: 1160 [P.O.:1160]  Out: -       Pertinent exam findings:   Well appearing, in chair at window on his computer, talking with his mom  RRR  Speaking easily on room air  No wheeze, rhonchi, or cough  No add tenderness  No LEE    Relevant Imaging:  -06/2022 CTA chest demonstarting no PE. chronic RUL scaring, no new infiltrate, no new pleural space disease, and chronic mild emphysema. Stable from prior.    Arterial Blood Gas:   No results in the last day     Venous Blood Gas:   No results in the last day     Cultures:  Blood Culture, Routine (no units)   Date Value   07/01/2022 No Growth at 24 hours   07/01/2022 No Growth at 24 hours     WBC (10*9/L)   Date Value   07/02/2022 14.5 (H)     WBC, UA (/HPF)   Date Value   07/02/2022 1          Other Labs:  Lab Results   Component Value Date    WBC 14.5 (H) 07/02/2022    HGB 17.2 (H) 07/02/2022    HCT 50.5 (H) 07/02/2022    PLT 137 (L) 07/02/2022     Lab Results   Component Value Date    NA 137 07/02/2022    K 4.0 07/02/2022    CL 105 07/02/2022    CO2 24.0 07/02/2022    BUN 17 07/02/2022    CREATININE 1.52 (H) 07/02/2022    GLU 102 07/02/2022    CALCIUM 8.9 07/02/2022    MG 2.0 02/22/2022    PHOS 4.0 02/08/2022     Lab Results   Component Value Date    BILITOT 0.7 07/02/2022    BILIDIR 0.30 07/02/2022    PROT 6.5 07/02/2022    ALBUMIN 3.8 07/02/2022  ALT 70 (H) 07/02/2022    AST 21 07/02/2022    ALKPHOS 87 07/02/2022     Lab Results   Component Value Date    INR 0.91 04/07/2022    APTT 62.2 (H) 02/08/2022       Allergies & Home Medications   Personally reviewed in Epic    Continuous Infusions:    IP okay to treat      sodium chloride 20 mL/hr (07/01/22 2153)       Scheduled Medications:    acetaminophen  1,000 mg Oral TID    amlodipine  5 mg Oral Daily    apixaban  5 mg Oral BID    aspirin  81 mg Oral Daily    carvedilol  12.5 mg Oral BID    fluticasone propionate  2 spray Each Nare BID    linezolid  600 mg Oral Q12H SCH    mirtazapine  30 mg Oral Nightly    nortriptyline  50 mg Oral Nightly    ondansetron  4 mg Oral Once    oxymetazoline  3 spray Each Nare BID    piperacillin-tazobactam  4.5 g Intravenous Q8H SCH    PONATinib  30 mg Oral Daily    sodium chloride  10 mL Intravenous BID    sodium chloride  10 mL Intravenous BID    sodium chloride  10 mL Intravenous BID    sodium chloride  2 spray Each Nare BID    sulfamethoxazole-trimethoprim  1 tablet Oral BID on Sat and Sun    umeclidinium-vilanterol  1 puff Inhalation Daily (RT)    valACYclovir  500 mg Oral Daily       PRN medications:  albuterol, emollient combination no.92, HYDROmorphone, HYDROmorphone, IP okay to treat, loperamide, loperamide, ondansetron, prochlorperazine

## 2022-07-03 NOTE — Unmapped (Signed)
Pt alert and oriented x4; VSS; afebrile. Pt c/o unresolved facial pain; PRN pain medication provided.  No acute events occurred during the shift. Pt ambulated off the unit three times today.  Will continue to monitor.     Problem: Adult Inpatient Plan of Care  Goal: Plan of Care Review  Outcome: Ongoing - Unchanged  Goal: Patient-Specific Goal (Individualized)  Outcome: Ongoing - Unchanged  Goal: Absence of Hospital-Acquired Illness or Injury  Outcome: Ongoing - Unchanged  Intervention: Prevent Skin Injury  Recent Flowsheet Documentation  Taken 07/03/2022 1200 by Etter Sjogren, RN  Positioning for Skin: Sitting in Chair  Device Skin Pressure Protection: adhesive use limited  Skin Protection: adhesive use limited  Goal: Optimal Comfort and Wellbeing  Outcome: Ongoing - Unchanged  Goal: Readiness for Transition of Care  Outcome: Ongoing - Unchanged  Goal: Rounds/Family Conference  Outcome: Ongoing - Unchanged     Problem: Nausea and Vomiting  Goal: Nausea and Vomiting Relief  Outcome: Ongoing - Unchanged     Problem: Oncology Care  Goal: Effective Coping  Outcome: Ongoing - Unchanged  Goal: Improved Activity Tolerance  Outcome: Ongoing - Unchanged  Goal: Optimal Oral Intake  Outcome: Ongoing - Unchanged  Goal: Improved Oral Mucous Membrane Integrity  Outcome: Ongoing - Unchanged  Intervention: Promote Oral Comfort and Health  Recent Flowsheet Documentation  Taken 07/03/2022 1200 by Etter Sjogren, RN  Oral Mucous Membrane Protection:   nonirritating oral fluids promoted   nonirritating oral foods promoted  Goal: Optimal Pain Control and Function  Outcome: Ongoing - Unchanged     Problem: Pain Acute  Goal: Optimal Pain Control and Function  Outcome: Ongoing - Unchanged

## 2022-07-03 NOTE — Unmapped (Signed)
General Pulmonary Team Treatment Plan Note     Date of Service: 07/03/2022  Requesting Physician: [name], MD   Requesting Service: Oncology/Hematology (MDE)  Reason for consultation: Comprehensive evaluation of respiratory failure.    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 male with PMHx B-ALL, COPD with active smoking, MRSA TV IE, RUL MRSA pneumonia c/b empyema who presented for pain control, ear pain, and shortness of breath. We are consulted for optimization his COPD management.     Patient reports that he has baseline shortness of breath related to COPD and his active smoking. He has a smoking history of 40+ pack years. He follows with Dr Alvino Chapel in pulmonary clinic. At their last visit he was doing well without any recent exacerbations. He was transitioned to [employer] (LABA/LAMA) as he was not using his Anoro due to the dry powder formulation. He states this works great for him - so good that he sometimes forgets to take it and then he can feel that he hasn't.     His shortness of breath got worse approximately 1 week ago though he reports this has manifested as dyspnea and not hypoxia. He states he has a chronic cough and wheeze. His dyspnea is worse with exertion but not positiional. No fevers, sick contacts, or travel. He does have a history of volume overload and is on a diuretic but he states he thinks his fluid status is good currently. He reports from a breathing standpoint today is better than yesterday and he could be at home.     He is on room air at the time of consult with a negative RPP, he is unable to induce sputum. He has a CTA chest demonstarting no PE. chronic RUL scaring, no new infiltrate, no new pleural space disease, and chronic mild emphysema.     Interval Events:   > 3/31 Still on room air. Describes a lot of jaw/ear pain on L side which is his major concern. Still feels his breathing is at baseline.    Problems addressed during this consult include  COPD . Based on these problems, the patient has low risk of morbidity/mortality which is commensurate w their risk of management options described below in the recommendations.    Assessment      Impression: Adam Keith is a 42 y.o. male with PMHx B-ALL, COPD with active smoking, MRSA TV IE, RUL MRSA pneumonia c/b empyema who presented for pain control, ear pain, and shortness of breath. We are consulted for optimization his COPD management. In speaking with him, he does not appear to be in a COPD exacerbation and he feels his new Stiolto inhaler works very well for him. There does not appear to be a role for switching his inhaler or adding steroids currently. Similarly, with his history of pneumonia it seems prudent to avoid escalating to a triple therapy inhaler as these are associated with a higher risk of pneumonia - and it is not clear he would benefit. Smoking cessation and regular adherence would be of greatest benefit. I suspect he is too  well for pulmonary rehab, but regular exercise would also help.     With regards to other potential etiologies of his dyspnea beyond COPD, he is broadly covered for pneumonia at the moment though his imaging is unrevealing. Does not appear to be volume overloaded. His CTA is negative for PE. No other pleural or parenchymal abnormalities on his imaging.     The patient has made significant clinical progress since last encounter. I personally reviewed most recent pertinent labs, imaging and micro data, from 07/03/2022, which are noted in recommendations..        Recommendations     - Continue LABA/LAMA, discharge on Stiolto  - PRN nebs  - No role for steroids as patient does not appear to be in a COPD exacerbation  - He has follow up with Dr. Alvino Chapel with repeat PFTs 07/2022 - does not need repeated before then  - Antibiotics per primary team, ID  - Consider cardiac workup if dyspnea on exertion persists without clear explanation  - We will sign off    This patient was discussed with Dr. Doralee Albino . The recommendations outlined in this note were discussed w the primary team via phone. Please do not hesitate to page [phone] (gen pulmonary consult fellow) with questions. We appreciate the opportunity to assist in the care of this patient. We will sign off at this time.    Jessica Priest, MD    Subjective & Objective     Vitals - past 24 hours  Temp:  [36.4 ??C (97.5 ??F)-37 ??C (98.6 ??F)] 36.4 ??C (97.5 ??F)  Heart Rate:  [77-94] 92  Resp:  [17-20] 17  BP: (101-166)/(73-90) 166/88  SpO2:  [92 %-98 %] 92 % Intake/Output  I/O last 3 completed shifts:  In: 1160 [P.O.:1160]  Out: -       Pertinent exam findings:   Well appearing, in chair at window on his computer, talking with his mom  RRR  Speaking easily on room air  No wheeze, rhonchi, or cough  No add tenderness  No LEE    Relevant Imaging:  -06/2022 CTA chest demonstarting no PE. chronic RUL scaring, no new infiltrate, no new pleural space disease, and chronic mild emphysema. Stable from prior.    Arterial Blood Gas:   No results in the last day     Venous Blood Gas:   No results in the last day     Cultures:  Blood Culture, Routine (no units)   Date Value   07/01/2022 No Growth at 24 hours   07/01/2022 No Growth at 24 hours     WBC (10*9/L)   Date Value   07/03/2022 9.6     WBC, UA (/HPF)   Date Value   07/02/2022 1          Other Labs:  Lab Results   Component Value Date    WBC 9.6 07/03/2022    HGB 15.5 07/03/2022    HCT 45.5 07/03/2022    PLT 132 (L) 07/03/2022     Lab Results   Component Value Date    NA 138 07/03/2022    K 3.6 07/03/2022    CL 104 07/03/2022    CO2 30.0 07/03/2022    BUN 21 07/03/2022    CREATININE 2.12 (H) 07/03/2022    GLU 117 07/03/2022    CALCIUM 8.5 (L) 07/03/2022    MG 2.0 02/22/2022    PHOS 4.0 02/08/2022     Lab Results   Component Value Date  BILITOT 0.7 07/02/2022    BILIDIR 0.30 07/02/2022    PROT 6.5 07/02/2022    ALBUMIN 3.8 07/02/2022    ALT 70 (H) 07/02/2022    AST 21 07/02/2022    ALKPHOS 87 07/02/2022     Lab Results   Component Value Date    INR 0.91 04/07/2022    APTT 62.2 (H) 02/08/2022       Allergies & Home Medications   Personally reviewed in Epic    Continuous Infusions:    IP okay to treat      sodium chloride 20 mL/hr (07/01/22 2153)       Scheduled Medications:    acetaminophen  1,000 mg Oral TID    amlodipine  5 mg Oral Daily    apixaban  5 mg Oral BID    aspirin  81 mg Oral Daily    carvedilol  12.5 mg Oral BID    fluticasone propionate  2 spray Each Nare BID    linezolid  600 mg Oral Q12H SCH    mirtazapine  30 mg Oral Nightly    nortriptyline  50 mg Oral Nightly    ondansetron  4 mg Oral Once    oxymetazoline  3 spray Each Nare BID    piperacillin-tazobactam  4.5 g Intravenous Q8H SCH    PONATinib  30 mg Oral Daily    sodium chloride  10 mL Intravenous BID    sodium chloride  10 mL Intravenous BID    sodium chloride  10 mL Intravenous BID    sodium chloride  2 spray Each Nare BID    sulfamethoxazole-trimethoprim  1 tablet Oral BID on Sat and Sun    umeclidinium-vilanterol  1 puff Inhalation Daily (RT)    valACYclovir  500 mg Oral Daily       PRN medications:  albuterol, emollient combination no.92, HYDROmorphone, HYDROmorphone, IP okay to treat, loperamide, loperamide, ondansetron, prochlorperazine

## 2022-07-03 NOTE — Unmapped (Signed)
Problem: Adult Inpatient Plan of Care  Goal: Plan of Care Review  Outcome: Ongoing - Unchanged  Flowsheets (Taken 07/02/2022 1746)  Progress: no change  Plan of Care Reviewed With: patient  Goal: Patient-Specific Goal (Individualized)  Outcome: Ongoing - Unchanged  Goal: Absence of Hospital-Acquired Illness or Injury  Outcome: Ongoing - Unchanged  Goal: Optimal Comfort and Wellbeing  Outcome: Ongoing - Unchanged  Goal: Readiness for Transition of Care  Outcome: Ongoing - Unchanged  Goal: Rounds/Family Conference  Outcome: Ongoing - Unchanged     Problem: Oncology Care  Goal: Effective Coping  Outcome: Ongoing - Unchanged  Goal: Improved Activity Tolerance  Outcome: Ongoing - Unchanged  Goal: Optimal Oral Intake  Outcome: Ongoing - Unchanged  Goal: Improved Oral Mucous Membrane Integrity  Outcome: Ongoing - Unchanged  Goal: Optimal Pain Control and Function  Outcome: Ongoing - Unchanged

## 2022-07-03 NOTE — Unmapped (Signed)
IMMUNOCOMPROMISED HOST INFECTIOUS DISEASE CONSULT NOTE    Adam Keith is being seen in consultation at the request of [name], MD for evaluation of Otitis Media with left mastoid effusion in immunosuppressed patient.       Assessment/Recommendations:    Adam Keith is a 42 y.o. 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 krusei fungemia  - Prior chemotherapy: [id number]-2005 induction with dasatinib (vincristine, dexamethasone and dasatinib); C1D1 01/24/2020; difficulty tolerating single-agent dasatinib  - Current chemotherapy: blinatumomab (anti-CD19/CD3) + ponatinib (TKI) last in Oct 2022; 01/2021 now on ponatinib monotherapy maintenance  - 01/11/21: Normocellular bone marrow (30% overall) with trilineage hematopoiesis and less than 1% blasts by manual aspirate differential; Flow cytometry MRD analysis reveals no definitive immunophenotypic evidence of residual B lymphoblastic leukemia; BCR-ABL p210 transcripts were detected at a level of 0.002 IS % ratio in bone marrow.  -06/24/21: Peripheral blood - lack of detectable BCR-ABL1 p210 RNA suggests molecular remission of leukemia  -11/2021: elected not to proceed with BMT     # Hypogammaglobulimia  - 11/01/20 IgG 266 declined IVIG  - 03/11/21 IgG 148 - IVIG infusion stopped as patient developed rigors, tachycardia and HTN during infusion despite pre-medication   - 03/12/21 IgG 213 - completed IVIG with premedications and slower rate of infusion  -03/17/21 IgG 632  -06/24/21 IgG 292 - IVIG infusion stopped as patient did not tolerate despite pre-medication  -10/19/21 IgG 321  - 12/02/21 IgG 284     # Mild CKD  baseline Cr 1.7     Pertinent Co-morbidities  # COPD/emphysema  # DIHS/DRESS ceftaroline 06/12/2020, relapse after cefepime 11/02/2020  - per prior ID notes possible culprits ceftaroline, posaconazole, dasatinib, sotrovimab  # GERD on omeprazole, 8/2  # Hearing loss; new onset L sided hearing loss, 10/2021         Pertinent Exposure History   Active smoking  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 Nonspecific findings but can be seen with otitis media. No evidence of intracranial extension and no evidence of cholesteatoma.     Rx: 3/29: Linezolid/Zosyn --->     # Recurrent, severe MRSA infections with intubation; on long-term secondary prophylaxis since 03/26/21  - 01/27/20 MRSA bacteremia + PNA + TV IE   - 11/01/2020 MRSA RLL PNA c/b bacteremia, probable right empyema 11/13/2020  - 11/18/2020 RML MRSA pneumonia  - 02/02/2021 Dr. Juliene Pina felt chest pain is appropriate in setting of medically managed empyema and risk of thoracotomy outweighed benefit given clinical symptoms  - 03/05/21 MRSA pneumonia after influenza infection requiring ICU admission/intubation  - 12/11 CT Chest : Multifocal consolidative airspace disease, nonspecific small pleural effusion in the dependent aspect of the left pleural space and trace loculated collections within the lateral aspect of the right chest.  3/29 - presented to ED with chills/fatigue/dyspnea   3/29 - CXR with no acute process. 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  # Recurrent nasal congestion/ L maxillary sinusitis in setting of L posterior and R anterior septal deviation, 10/11/21  #COVID (+) - s/p Molnupirovir 05/31/22     Antimicrobial Intolerance/allergy  Cefepime - DRESS/DiHS  Ceftaroline - DRESS/DiHS  Dapsone - possible agranulocytosis, per chart anaphylaxis  Vancomycin - probable ototoxicity (in combination with furosemide)  Isavuconazole - elevated LFTs in the setting of TKI  Linezolid - peripheral neuropathy; previously tolerated tedizolid  Aztreonam+linezolid - patient reported ear pain and worsening hearing (in the setting COVID-19)       RECOMMENDATIONS    Diagnosis  NOTED: Brain Mri with evidence of otitis media with L mastoid effusion. No intracranial extension or evidence of cholesteatoma.   Would appreciate ENT thoughts on patient's brain MRI   FU 3/29 Blood Cx - NGTD  FU Hep B Surface Ag (-), Hep C Ab non-reactive, Galactomannan pending  Noted: urine histo/legionella negative    Management  Continue Linezolid 600mg  BID  Continue IV Zosyn 4.5 q6hrs   Ok to hold home chronic PO Doxycyline while on the abx above    Antimicrobial prophylaxis required for host deficiency: chemotherapy  Cont Valacyclovir (CMV/HSV ppx)     Intensive toxicity monitoring for prescription antimicrobials   CBC w/diff at least once per week  CMP at least once per week  clinical assessments for rashes or other skin changes  >>> PENICILLINS --> CBC/diff weekly -- CMP weekly    The [facility] ID service will continue to follow.           Please page the ID Transplant/Liquid Oncology Fellow consult at [phone] with questions.  Patient discussed with Dr. Warrick Parisian.    Inocencio Homes, DO  [place] [facility]    History of Present Illness:      External record(s): Consultant note(s): NAEO. Patient with continued L ear pain. Patient is really wanting to leave the hospital .    Independent historian(s): no independent historian required.       INTERVAL HISTORY:   NAEO. VSS on RA. Patient states that he still has the L ear fullness but otherwise is feeling better. Patient is really wanting  to go home as he has a boat and garage that needs cleaned. Wife at bedside updated.      Vital Signs last 24 hours:  Temp:  [36.4 ??C (97.5 ??F)-37 ??C (98.6 ??F)] 37 ??C (98.6 ??F)  Heart Rate:  [77-94] 80  Resp:  [17-20] 17  BP: (101-166)/(71-90) 121/71  MAP (mmHg):  [83-104] 86  SpO2:  [92 %-98 %] 98 %    Physical Exam:  Patient Lines/Drains/Airways Status       Active Active Lines, Drains, & Airways  Name Placement date Placement time Site Days    Peripheral IV 07/01/22 Anterior;Distal;Left;Upper Arm 07/01/22  1403  Arm  2                  Const [x]  vital signs above    [x]  NAD, non-toxic appearance []  Chronically ill-appearing, non-distressed  Sitting in the chair in the room playing on his phone. Wife at bedside.       Eyes [x]  Lids normal bilaterally, conjunctiva anicteric and noninjected OU     [] PERRL  [] EOMI        ENMT [x]  Normal appearance of external nose and ears, no nasal discharge        []  MMM, no lesions on lips or gums []  No thrush, leukoplakia, oral lesions  []  Dentition good []  Edentulous []  Dental caries present  []  Hearing normal  []  TMs with good light reflexes bilaterally         Neck [x]  Neck of normal appearance and trachea midline        []  No thyromegaly, nodules, or tenderness   []  Full neck ROM        Lymph [x]  No LAD in neck     []  No LAD in supraclavicular area     []  No LAD in axillae   []  No LAD in epitrochlear chains     []  No LAD in inguinal areas  Some tenderness over the TMJ. Unable to appreciate lymphadenopathy.       CV [x]  RRR            [x]  No peripheral edema     []  Pedal pulses intact   [x]  No abnormal heart sounds appreciated   []  Extremities WWP         Resp [x]  Normal WOB at rest on RA   []  No breathlessness with speaking, no coughing  []  CTA anteriorly    []  CTA posteriorly    (+) crackles in upper R listening post.       GI [x]  Normal inspection, NTND   [x]  NABS     []  No umbilical hernia on exam       []  No hepatosplenomegaly     []  Inspection of perineal and perianal areas normal        GU []  Normal external genitalia     [] No urinary catheter present in urethra   []  No CVA tenderness    []  No tenderness over renal allograft  Deferred      MSK []  No clubbing or cyanosis of hands       []  No vertebral point tenderness  []  No focal tenderness or abnormalities on palpation of joints in RUE, LUE, RLE, or LLE        Skin [x]  No rashes, lesions, or ulcers of visualized skin     []  Skin warm and dry to palpation         Neuro [x]  Face expression symmetric  []  Sensation to light touch grossly intact throughout    [x]  Moves extremities equally    []  No tremor noted        []  CNs II-XII grossly intact     []  DTRs normal and symmetric throughout []  Gait unremarkable        Psych [x]  Appropriate affect       []  Fluent speech         []  Attentive, good eye contact  [x]  Oriented to person, place, time          []   Judgment and insight are appropriate   Calm.         Data for Medical Decision Making     (07/01/22) EKG QTcF: 403    I discussed mgm't w/qualified health care professional(s) involved in case: Recs shared with primary team .    I reviewed CBC results (WBC downtrending), chemistry results (Cr uptrending), and micro result(s) (3/29 Blood Cx - NGTD, LRT Cx - pending).    I independently visualized/interpreted MRI images (brain MRI with L mastoid effusion).       Recent Labs   Lab Units 07/03/22  0506 07/02/22  0557   WBC 10*9/L 9.6 14.5*   HEMOGLOBIN g/dL 16.1 09.6*   PLATELET COUNT (1) 10*9/L 132* 137*   NEUTRO ABS 10*9/L 6.5 12.2*   LYMPHO ABS 10*9/L 2.1 1.4   EOSINO ABS 10*9/L 0.2 0.2   BUN mg/dL 21 17   CREATININE mg/dL 0.45* 4.09*   AST U/L  --  21   ALT U/L  --  70*   BILIRUBIN TOTAL mg/dL  --  0.7   ALK PHOS U/L  --  87   POTASSIUM mmol/L 3.6 4.0   CALCIUM mg/dL 8.5* 8.9       Lab Results   Component Value Date    CRP 44.0 (H) 12/28/2020    Total IgG 284 (L) 12/02/2021       Microbiology:  Microbiology Results (last day)       Procedure Component Value Date/Time Date/Time    Blood Culture #1 [[id number]]  (Normal) Collected: 07/01/22 1233    Lab Status: Preliminary result Specimen: Blood from 1 Peripheral Draw Updated: 07/02/22 1315     Blood Culture, Routine No Growth at 24 hours    Blood Culture #2 [[id number]]  (Normal) Collected: 07/01/22 1233    Lab Status: Preliminary result Specimen: Blood from 1 Peripheral Draw Updated: 07/02/22 1315     Blood Culture, Routine No Growth at 24 hours            Imaging:  MRI Brain W Wo Contrast    Result Date: 07/02/2022  EXAM: Magnetic resonance imaging, brain without and with contrast material. DATE: 07/02/2022 3:13 PM ACCESSION: [id number] UN DICTATED: 07/02/2022 3:17 PM INTERPRETATION LOCATION: [facility]     CLINICAL INDICATION: 42 years old Male with 13M with ALL, persistent ear pain and jaw pain with concern for infection (further eval inner ear and mastoid process)      COMPARISON: MRI brain 03/23/2020     TECHNIQUE: Multiplanar, multisequence MR imaging of the brain was performed without and with I.V. contrast, including high resolution skull base images according to our cranial nerve VIII protocol.     FINDINGS: RIGHT: No evidence of cerebellopontine angle mass or internal auditory canal lesion.  There are four cranial nerves within the internal auditory canals. Normal morphology of the inner ear structures. No abnormal enhancement. No restricted diffusion. No superior semicircular canal dehiscence.     LEFT: No evidence of cerebellopontine angle mass or internal auditory canal lesion.  There are four cranial nerves within the internal auditory canals. Normal morphology of the inner ear structures. No abnormal enhancement. No restricted diffusion. No superior semicircular canal dehiscence. T2 hyperintense signal in the middle ear. Effusion of the mastoid air cells.     The temporomandibular joints are normal. No surrounding soft tissue edema or enhancement.     Ventricles are normal in size. No midline shift. There is no acute intracranial hemorrhage or acute infarct. Diminutive left  V4 vertebral artery, unchanged.         --Signal normality in the left middle ear with left mastoid effusion. Findings are nonspecific though can be seen with otitis media. There is no evidence of intracranial extension, and no evidence of restricted diffusion to suggest cholesteatoma.         ECG 12 Lead    Result Date: 07/02/2022  NORMAL SINUS RHYTHM POSSIBLE LEFT ATRIAL ENLARGEMENT RIGHTWARD AXIS WHEN COMPARED WITH ECG OF 01-Jul-2022 15:12, NO SIGNIFICANT CHANGE WAS FOUND Confirmed by Aundra Dubin ([id number]) on 07/02/2022 3:06:41 PM    CTA Chest W Contrast    Result Date: 07/01/2022  EXAM: CTA CHEST W CONTRAST ACCESSION: [id number] UN     CLINICAL INDICATION: Shortness of breath, dizziness     TECHNIQUE: Contiguous axial images were reconstructed through the chest following a single breath hold helical acquisition during the administration of intravenous contrast material. Images were reformatted in the coronal and sagittal planes. MIP slabs were also constructed.     COMPARISON: CTA chest 04/07/2022.     FINDINGS: PULMONARY ARTERIES: Evaluation of the very distal pulmonary arterial vasculature is limited by respiratory motion. No central or paracentral pulmonary embolus. Main pulmonary artery is normal in size.     HEART AND VASCULATURE: Cardiac chambers are normal in size. No pericardial effusion. Aorta is normal in caliber. Coronary atherosclerotic disease.     LUNGS, AIRWAYS, AND PLEURA: Unchanged scarring and architectural distortion in the right upper lung. Upper lung-predominant emphysema. Trace tracheal debris.     No pleural effusion or pneumothorax.     MEDIASTINUM AND LYMPH NODES: No enlarged intrathoracic, axillary, or supraclavicular lymph nodes. No mediastinal mass or other abnormality.     CHEST WALL AND BONES: Bilateral gynecomastia.     UPPER ABDOMEN: No acute abnormality.     OTHER: No other findings.                 1. No evidence of pulmonary embolism. 2. Upper lung-predominant emphysema. Chronic right upper lobe fibrosis.    ECG 12 Lead    Result Date: 07/01/2022  NORMAL SINUS RHYTHM RIGHTWARD AXIS BORDERLINE ECG WHEN COMPARED WITH ECG OF 01-Jul-2022 11:25, NO SIGNIFICANT CHANGE WAS FOUND Confirmed by Christella Noa [phone]) on 07/01/2022 5:10:08 PM    ECG 12 Lead    Result Date: 07/01/2022  SINUS TACHYCARDIA RIGHTWARD AXIS BORDERLINE ECG WHEN COMPARED WITH ECG OF 12-May-2022 14:00, NO SIGNIFICANT CHANGE WAS FOUND Confirmed by Christella Noa [phone]) on 07/01/2022 2:31:36 PM    XR Chest 2 views    Result Date: 07/01/2022  EXAM: XR CHEST 2 VIEWS ACCESSION: [id number] UN     CLINICAL INDICATION: CHEST PAIN      TECHNIQUE: PA and Lateral Chest Radiographs.     COMPARISON: CTA chest 04/07/2022 and chest radiograph 05/12/2022     FINDINGS:     Unchanged right upper lobe volume loss with apical pleural thickening and scarring. No pleural effusion or pneumothorax.     Cardiac silhouette is normal in size.             No acute cardiopulmonary abnormalities.

## 2022-07-03 NOTE — Unmapped (Signed)
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 He has been treated with a variety of antibiotics without any relief. Patient is on chronic Doxy, Bactrim, and Valtrex for prophylaxis. Despite abx, patient presenting with acute onset of dypnea and cough. Patient also with complaints of ear fullness and pain. RPP negative in the ED. Lung sounds clear to auscultation bilaterally. CXR unremarkable. Had leukocytosis and fever at home; lactate and BNP normal. Started linezolid and Zosyn per ICID recommendations. Recently completed course of Augmentin and steroids for hearing loss and concern for otitis media; ENT feels he may have eustachian tube dysfunction; has history of severe right sided hearing loss due to medication but now having worsening left sided hearing loss. MRI brain revealed Signal abnormality in the left middle ear with left mastoid effusion.CTA chest negative for PE, did show upper lung predominant emphysema and chronic RUL fibrosis. Reassuringly he is now satting well on room air. Will obtain echocardiogram to evaluate for potential pulmonary hypertension. Unclear whether there is unifying diagnosis for his ongoing symptoms, but at this point we are concerned for infectious process. Less likely that these symptoms are due to his well-controlled ALL or his ponatinib.   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 The course was complicated by septic shock, candida krusei fungemia, MRSA bacteremia with septic emboli c/b acute renal failure and respiratory distress requiring dialysis and intubation. 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 He subsequently had difficulty tolerating single agent dasatinib (as a bridge to planned ponatinib-blinatumomab--had fluid retention and pleural effusion). He then completed 4 cycles of ponatinib-blinatumomab, initiated for treatment of MRD in the setting of poor tolerance of standard therapy. He decided to forego bone marrow transplant.   - Continue home ponatinib, has medication with him  - Continue home ASA 81mg  daily (taking due to ponatinib)     Mood Disorder  Patient has ongoing trouble with mood and depression in the  setting of chronic ailments. He used to like working in his woodshop, but cannot anymore due to pain and fatigue. He follows with Dr. [name] of [facility].   - Continue home Mirtazapine, Nortriptyline    Hypertension  - Home amlodipine at 5mg  daily, increase to home dose as indicated  - Home carvedilol 12.5mg  BID  -- Holding home hydrochlorothiazide 12.5mg  daily, resume as indicated  - Holding home sprionolactone 25mg  daily, resume as indicated    History of LLE DVT (Sept 2023) and hx of PE, currently on Eliquis)  CTA chest on admission negative for PE.  - Continue home apixaban 5mg  BID    AKI on CKD  Creatinine bumped to 2.12 on 3/31.Unclear etiology. Work up as below:   - renal lytes pending  - renal ultrasound pending  - UA w microscopy pending  - Daily BMP    Antimicrobial ppx  - Continue home Valtrex daily  - Continue home Bactrim Saturday and Sunday  - Holding home doxycycline while on broad spectrum antibiotics    Impending Electrolyte Abnormality Secondary to Chemotherapy and/or IV Fluids  -Daily Electrolyte monitoring  -Replete per [facility] guidelines.     Cancer related fatigue:  Patient endorses fatigue with onset of cancer symptoms or treatment.  Symptoms started many weeks ago.  Primary symptoms are fatigue, feeling tired     Cancer related Pain:  Patient endorses pain associated with Leukemia.  -Current Regimen: oxycodone 5-10mg  q4hr prn; Dilaudid 1mg  IV q4hr prn for breakthrough pain refractory to PO meds    Immunocompromised status: Patient is immunocompromised secondary to leukemia and chemotherapy  -Antimicrobial prophylaxis as above    Impending Pancytopenia secondary to Acute Leukemia/chemotherapy:   - Transfuse hgb <7  - Transfuse plt <10K    Subjective:     Interval History: Feeling about the same.    10 point ROS otherwise negative except as above in the HPI.     Objective:     Vital signs in last 24 hours:  Temp:  [36.4 ??C (97.5 ??F)-37 ??C (98.6 ??F)] 36.4 ??C (97.5 ??F)  Heart Rate:  [77-94] 92  Resp:  [17-20] 17  BP: (101-166)/(73-90) 166/88  MAP (mmHg):  [83-104] 102  SpO2:  [92 %-98 %] 92 %    Intake/Output last 3 shifts:  I/O last 3 completed shifts:  In: 1160 [P.O.:1160]  Out: -     Meds:  Current Facility-Administered Medications   Medication Dose Route Frequency Provider Last Rate Last Admin    acetaminophen (TYLENOL) tablet 1,000 mg  1,000 mg Oral TID Marva Panda, MD   1,000 mg at 07/03/22 1013    albuterol 2.5 mg /3 mL (0.083 %) nebulizer solution 2.5 mg  2.5 mg Nebulization Q6H PRN Marva Panda, MD        amlodipine (NORVASC) tablet 5 mg  5 mg Oral Daily Marva Panda, MD   5 mg at 07/03/22 1013    apixaban (ELIQUIS) tablet 5 mg  5 mg Oral BID Melvia Heaps D, MD   5 mg at 07/03/22 1019    aspirin chewable tablet 81 mg  81 mg Oral Daily Marva Panda, MD   81 mg at 07/03/22 1013    carvedilol (COREG) tablet 12.5 mg  12.5 mg Oral BID Marva Panda, MD   12.5 mg at 07/03/22 1012    emollient combination no.92 (LUBRIDERM) lotion   Topical Q1H PRN Emogene Morgan, MD        fluticasone propionate (FLONASE) 50 mcg/actuation nasal  spray 2 spray  2 spray Each Nare BID Marva Panda, MD   2 spray at 07/03/22 1020    HYDROmorphone (DILAUDID) tablet 2 mg  2 mg Oral Q4H PRN Marva Panda, MD   2 mg at 07/02/22 1924    HYDROmorphone (PF) (DILAUDID) injection 1 mg  1 mg Intravenous Q4H PRN Marva Panda, MD   1 mg at 07/03/22 1007    IP OKAY TO TREAT   Other Continuous PRN [name], MD        linezolid (ZYVOX) tablet 600 mg  600 mg Oral Q12H [facility] Melvia Heaps D, MD   600 mg at 07/03/22 1012    loperamide (IMODIUM) capsule 2 mg  2 mg Oral Q2H PRN Jamyiah Labella, [place], MD        loperamide (IMODIUM) capsule 4 mg  4 mg Oral Once PRN Emogene Morgan, MD        mirtazapine (REMERON) tablet 30 mg  30 mg Oral Nightly Melvia Heaps D, MD   30 mg at 07/02/22 2107    nortriptyline (PAMELOR) capsule 50 mg  50 mg Oral Nightly Melvia Heaps D, MD   50 mg at 07/02/22 2106    ondansetron (ZOFRAN-ODT) disintegrating tablet 4 mg  4 mg Oral Q8H PRN Melvia Heaps D, MD   4 mg at 07/01/22 1718    ondansetron (ZOFRAN-ODT) disintegrating tablet 4 mg  4 mg Oral Once Emogene Morgan, MD        oxymetazoline (AFRIN) 0.05 % nasal spray 3 spray  3 spray Each Nare BID Marva Panda, MD   3 spray at 07/03/22 1021    piperacillin-tazobactam (ZOSYN) IVPB (premix) 4.5 g  4.5 g Intravenous [facility] Emogene Morgan, MD   Stopped at 07/03/22 1020    PONATinib (ICLUSIG) tablet 30 mg  **Patient Supplied**  30 mg Oral Daily [name], MD   30 mg at 07/02/22 2110    prochlorperazine (COMPAZINE) tablet 5 mg  5 mg Oral Q6H PRN Melvia Heaps D, MD   5 mg at 07/02/22 0100    sodium chloride (NS) 0.9 % flush 10 mL  10 mL Intravenous BID Melvia Heaps D, MD   10 mL at 07/02/22 2000    sodium chloride (NS) 0.9 % flush 10 mL  10 mL Intravenous BID Melvia Heaps D, MD   10 mL at 07/02/22 2200    sodium chloride (NS) 0.9 % flush 10 mL  10 mL Intravenous BID Melvia Heaps D, MD   10 mL at 07/03/22 1027    sodium chloride (NS) 0.9 % infusion  20 mL/hr Intravenous Continuous Melvia Heaps D, MD 20 mL/hr at 07/01/22 2153 20 mL/hr at 07/01/22 2153    sodium chloride (OCEAN) 0.65 % nasal spray 2 spray  2 spray Each Nare BID Marva Panda, MD   2 spray at 07/03/22 1021    sulfamethoxazole-trimethoprim (BACTRIM DS) 800-160 mg tablet 160 mg of trimethoprim  1 tablet Oral BID on Sat and Sun Phillips, [place], MD   160 mg of trimethoprim at 07/03/22 1012    umeclidinium-vilanterol (ANORO ELLIPTA) 62.5-25 mcg/actuation inhaler 1 puff  1 puff Inhalation Daily (RT) Marva Panda, MD        valACYclovir (VALTREX) tablet 500 mg  500 mg Oral Daily Melvia Heaps D, MD   500 mg at 07/03/22 1013       Physical Exam:  General: Resting, in no apparent distress, lying in bed  HEENT:  EOMI, normocephalic and  atruamtic   Heart:  Normal rate, no murmurs noted.  Lungs:  Breathing is unlabored, and patient is speaking full sentences with ease.  No stridor.  Lungs CTAB. No wheezing or crackles noted.   Abdomen:  No distention or pain on palpation.  Bowel sounds are present and normoactive x 4.  Skin:  . Warm to touch,  Psychiatric:  Range of affect is appropriate.    Neurologic: A&O x 4, no focal deficits appreciated.  Extremities:  Appear well-perfused. No lower extremity edema    Labs:  Recent Labs     07/01/22  1216 07/01/22  1812 07/02/22  0557 07/03/22  0506 WBC 15.5*  --  14.5* 9.6   NEUTROABS 12.2*  --  12.2* 6.5   LYMPHSABS 2.1  --  1.4 2.1   HGB 17.6*  --  17.2* 15.5   HCT 51.4*  --  50.5* 45.5   PLT 147*  --  137* 132*   CREATININE 1.51*  --  1.52* 2.12*   BUN 16  --  17 21   BILITOT 0.5  --  0.7  --    BILIDIR  --   --  0.30  --    AST 28  --  21  --    ALT 87*  --  70*  --    ALKPHOS 89  --  87  --    K 4.2 4.2 4.0 3.6   CALCIUM 9.0  --  8.9 8.5*   NA 136 139 137 138   CL 105  --  105 104   CO2 24.0  --  24.0 30.0       Imaging:  CTA chest negative for PE    Emogene Morgan, MD, 07/03/2022  10:57 AM   Hematology/Oncology Department   [facility]   Group pager: [phone]

## 2022-07-04 LAB — CBC W/ AUTO DIFF
BASOPHILS ABSOLUTE COUNT: 0.1 10*9/L (ref 0.0–0.1)
BASOPHILS RELATIVE PERCENT: 1.1 %
EOSINOPHILS ABSOLUTE COUNT: 0.2 10*9/L (ref 0.0–0.5)
EOSINOPHILS RELATIVE PERCENT: 2.6 %
HEMATOCRIT: 44 % (ref 39.0–48.0)
HEMOGLOBIN: 15 g/dL (ref 12.9–16.5)
LYMPHOCYTES ABSOLUTE COUNT: 2.1 10*9/L (ref 1.1–3.6)
LYMPHOCYTES RELATIVE PERCENT: 26.7 %
MEAN CORPUSCULAR HEMOGLOBIN CONC: 34 g/dL (ref 32.0–36.0)
MEAN CORPUSCULAR HEMOGLOBIN: 29.6 pg (ref 25.9–32.4)
MEAN CORPUSCULAR VOLUME: 87.3 fL (ref 77.6–95.7)
MEAN PLATELET VOLUME: 8.9 fL (ref 6.8–10.7)
MONOCYTES ABSOLUTE COUNT: 0.5 10*9/L (ref 0.3–0.8)
MONOCYTES RELATIVE PERCENT: 6.7 %
NEUTROPHILS ABSOLUTE COUNT: 4.9 10*9/L (ref 1.8–7.8)
NEUTROPHILS RELATIVE PERCENT: 62.9 %
PLATELET COUNT: 131 10*9/L — ABNORMAL LOW (ref 150–450)
RED BLOOD CELL COUNT: 5.04 10*12/L (ref 4.26–5.60)
RED CELL DISTRIBUTION WIDTH: 16 % — ABNORMAL HIGH (ref 12.2–15.2)
WBC ADJUSTED: 7.8 10*9/L (ref 3.6–11.2)

## 2022-07-04 LAB — BASIC METABOLIC PANEL
ANION GAP: 5 mmol/L (ref 5–14)
BLOOD UREA NITROGEN: 17 mg/dL (ref 9–23)
BUN / CREAT RATIO: 10
CALCIUM: 9 mg/dL (ref 8.7–10.4)
CHLORIDE: 110 mmol/L — ABNORMAL HIGH (ref 98–107)
CO2: 27 mmol/L (ref 20.0–31.0)
CREATININE: 1.67 mg/dL — ABNORMAL HIGH
EGFR CKD-EPI (2021) MALE: 52 mL/min/{1.73_m2} — ABNORMAL LOW (ref >=60)
GLUCOSE RANDOM: 108 mg/dL (ref 70–179)
POTASSIUM: 4.1 mmol/L (ref 3.4–4.8)
SODIUM: 142 mmol/L (ref 135–145)

## 2022-07-04 LAB — PINK EXTRA TUBE

## 2022-07-04 MED ORDER — LEVOFLOXACIN 750 MG TABLET
ORAL_TABLET | ORAL | 0 refills | 10 days | Status: CP
Start: 2022-07-04 — End: 2022-07-14
  Filled 2022-07-04: qty 10, 10d supply, fill #0

## 2022-07-04 MED ORDER — STIOLTO RESPIMAT 2.5 MCG-2.5 MCG/ACTUATION SOLUTION FOR INHALATION
Freq: Every day | RESPIRATORY_TRACT | 0 refills | 14.00000 days | Status: CP
Start: 2022-07-04 — End: ?
  Filled 2022-07-04: qty 4, 30d supply, fill #0

## 2022-07-04 MED ORDER — OXYCODONE ER 30 MG TABLET,CRUSH RESISTANT,EXTENDED RELEASE 12 HR
ORAL_TABLET | Freq: Two times a day (BID) | ORAL | 0 refills | 5 days | Status: CP
Start: 2022-07-04 — End: ?

## 2022-07-04 MED ORDER — FLUTICASONE FUROATE 100 MCG-VILANTEROL 25 MCG/DOSE INHALATION POWDER
Freq: Every day | RESPIRATORY_TRACT | 0 refills | 1 days | Status: CN
Start: 2022-07-04 — End: ?

## 2022-07-04 MED ORDER — METHYLPREDNISOLONE 4 MG TABLET
ORAL_TABLET | ORAL | 0 refills | 6 days | Status: CN
Start: 2022-07-04 — End: 2022-07-10

## 2022-07-04 MED ORDER — MORPHINE ER 30 MG TABLET,EXTENDED RELEASE
ORAL_TABLET | Freq: Two times a day (BID) | ORAL | 0 refills | 30 days | Status: CN
Start: 2022-07-04 — End: ?

## 2022-07-04 MED ORDER — OXYCODONE 10 MG TABLET
ORAL_TABLET | ORAL | 0 refills | 5 days | Status: CP | PRN
Start: 2022-07-04 — End: 2022-07-09
  Filled 2022-07-04: qty 30, 5d supply, fill #0

## 2022-07-04 MED ORDER — NALOXONE 4 MG/ACTUATION NASAL SPRAY
0 refills | 0 days
Start: 2022-07-04 — End: ?

## 2022-07-04 MED ADMIN — methylPREDNISolone (MEDROL) tablet 24 mg: 24 mg | ORAL | @ 18:00:00 | Stop: 2022-07-04

## 2022-07-04 MED ADMIN — nortriptyline (PAMELOR) capsule 50 mg: 50 mg | ORAL | @ 01:00:00

## 2022-07-04 MED ADMIN — aspirin chewable tablet 81 mg: 81 mg | ORAL | @ 14:00:00 | Stop: 2022-07-04

## 2022-07-04 MED ADMIN — valACYclovir (VALTREX) tablet 500 mg: 500 mg | ORAL | @ 14:00:00 | Stop: 2022-07-04

## 2022-07-04 MED ADMIN — HYDROmorphone (DILAUDID) tablet 2 mg: 2 mg | ORAL | @ 02:00:00 | Stop: 2022-07-16

## 2022-07-04 MED ADMIN — oxymetazoline (AFRIN) 0.05 % nasal spray 3 spray: 3 | NASAL | @ 14:00:00 | Stop: 2022-07-04

## 2022-07-04 MED ADMIN — apixaban (ELIQUIS) tablet 5 mg: 5 mg | ORAL | @ 14:00:00 | Stop: 2022-07-04

## 2022-07-04 MED ADMIN — amlodipine (NORVASC) tablet 10 mg: 10 mg | ORAL | @ 14:00:00 | Stop: 2022-07-04

## 2022-07-04 MED ADMIN — HYDROmorphone (PF) (DILAUDID) injection 1 mg: 1 mg | INTRAVENOUS | @ 01:00:00 | Stop: 2022-07-15

## 2022-07-04 MED ADMIN — mirtazapine (REMERON) tablet 30 mg: 30 mg | ORAL | @ 01:00:00

## 2022-07-04 MED ADMIN — HYDROmorphone (PF) (DILAUDID) injection 1 mg: 1 mg | INTRAVENOUS | @ 06:00:00 | Stop: 2022-07-04

## 2022-07-04 MED ADMIN — apixaban (ELIQUIS) tablet 5 mg: 5 mg | ORAL | @ 01:00:00

## 2022-07-04 MED ADMIN — HYDROmorphone (DILAUDID) tablet 2 mg: 2 mg | ORAL | @ 09:00:00 | Stop: 2022-07-04

## 2022-07-04 MED ADMIN — sodium chloride (NS) 0.9 % flush 10 mL: 10 mL | INTRAVENOUS | @ 01:00:00

## 2022-07-04 MED ADMIN — carvedilol (COREG) tablet 12.5 mg: 12.5 mg | ORAL | @ 14:00:00 | Stop: 2022-07-04

## 2022-07-04 MED ADMIN — morphine (MS Contin) 12 hr tablet 30 mg: 30 mg | ORAL | @ 18:00:00 | Stop: 2022-07-04

## 2022-07-04 MED ADMIN — oxymetazoline (AFRIN) 0.05 % nasal spray 3 spray: 3 | NASAL | @ 01:00:00 | Stop: 2022-07-05

## 2022-07-04 MED ADMIN — sulfamethoxazole-trimethoprim (BACTRIM DS) 800-160 mg tablet 160 mg of trimethoprim: 1 | ORAL | @ 01:00:00 | Stop: 2022-07-03

## 2022-07-04 MED ADMIN — fluticasone propionate (FLONASE) 50 mcg/actuation nasal spray 2 spray: 2 | NASAL | @ 14:00:00 | Stop: 2022-07-04

## 2022-07-04 MED ADMIN — piperacillin-tazobactam (ZOSYN) IVPB (premix) 4.5 g: 4.5 g | INTRAVENOUS | @ 05:00:00 | Stop: 2022-07-04

## 2022-07-04 MED ADMIN — HYDROmorphone (PF) (DILAUDID) injection 1 mg: 1 mg | INTRAVENOUS | @ 18:00:00 | Stop: 2022-07-04

## 2022-07-04 MED ADMIN — sodium chloride (OCEAN) 0.65 % nasal spray 2 spray: 2 | NASAL | @ 14:00:00 | Stop: 2022-07-04

## 2022-07-04 MED ADMIN — acetaminophen (TYLENOL) tablet 1,000 mg: 1000 mg | ORAL | @ 14:00:00 | Stop: 2022-07-04

## 2022-07-04 MED ADMIN — HYDROmorphone (DILAUDID) tablet 2 mg: 2 mg | ORAL | @ 13:00:00 | Stop: 2022-07-04

## 2022-07-04 MED ADMIN — sodium chloride (OCEAN) 0.65 % nasal spray 2 spray: 2 | NASAL | @ 01:00:00

## 2022-07-04 MED ADMIN — fluticasone propionate (FLONASE) 50 mcg/actuation nasal spray 2 spray: 2 | NASAL | @ 01:00:00

## 2022-07-04 MED ADMIN — linezolid (ZYVOX) tablet 600 mg: 600 mg | ORAL | @ 14:00:00 | Stop: 2022-07-04

## 2022-07-04 MED ADMIN — PONATinib (ICLUSIG) tablet 30 mg  **Patient Supplied**: 30 mg | ORAL | @ 01:00:00

## 2022-07-04 MED ADMIN — HYDROmorphone (PF) (DILAUDID) injection 1 mg: 1 mg | INTRAVENOUS | @ 05:00:00 | Stop: 2022-07-04

## 2022-07-04 MED ADMIN — piperacillin-tazobactam (ZOSYN) IVPB (premix) 4.5 g: 4.5 g | INTRAVENOUS | @ 14:00:00 | Stop: 2022-07-04

## 2022-07-04 MED ADMIN — carvedilol (COREG) tablet 12.5 mg: 12.5 mg | ORAL | @ 01:00:00

## 2022-07-04 MED ADMIN — linezolid (ZYVOX) tablet 600 mg: 600 mg | ORAL | @ 01:00:00 | Stop: 2022-07-09

## 2022-07-04 MED ADMIN — HYDROmorphone (PF) (DILAUDID) injection 1 mg: 1 mg | INTRAVENOUS | @ 14:00:00 | Stop: 2022-07-04

## 2022-07-04 MED ADMIN — acetaminophen (TYLENOL) tablet 1,000 mg: 1000 mg | ORAL | @ 01:00:00

## 2022-07-04 MED FILL — OXYCONTIN 30 MG TABLET,CRUSH RESISTANT,EXTENDED RELEASE: ORAL | 5 days supply | Qty: 10 | Fill #0

## 2022-07-04 NOTE — Unmapped (Signed)
Error

## 2022-07-04 NOTE — Unmapped (Signed)
Physician Discharge Summary [facility]  4 ONC [facility]  [address]  [place] [address]  Dept: [phone]  Loc: [phone]     Identifying Information:   Tanner Tovar  02-Jun-1980  [id number]    Primary Care Physician: Dierdre Harness, MD     Referring Physician: Referred Self     Code Status: Full Code    Admit Date: 07/01/2022    Discharge Date: 07/04/2022     Discharge To: Home    Discharge Service: [facility] - Hematology Res Floor Team (MED E - Tower)     Discharge Attending Physician: Linna Hoff, MD    Discharge Diagnoses:  Principal Problem:    Fatigue  Active Problems:    Tobacco use disorder    Immunocompromised (CMS-HCC)    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 *      Outpatient Provider Follow Up Issues:   Follow-up Plan after discharge:  Issues related to hospitalization: pain control   Follow-up appointment with [facility] Oncology: 4/3 Senaida Ores (Provider and day/time)  Oncology specific plans going forward: continue Ponatinib    Patient's primary oncologist and/or nurse navigator: [facility] handoff via Epic message or direct conversation?: Yes.    Supportive Care Recommendations:  We recommend based on the patient???s underlying diagnosis and treatment history the following supportive care:    1. Blood product support:  Leukoreduced blood products are required.  Irradiated blood products are preferred, but in case of urgent transfusion needs non-irradiated blood products may be used:     -  RBC transfusion threshold: transfuse 1 units for Hgb < 7 g/dL.  -  Platelet transfusion threshold: transfuse 1 unit of platelets for platelet count < 10, or for bleeding or need for invasive procedure.    Based on the patient's disease status and intensity of therapy, complete blood count with differential should be evaluated 2 times per week and used to guide transfusion support      Hospital Course:     Mr. 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 Infectious work up remained negative throughout hospitalization. His white count improved. We recommended that patient stay and continue being worked up and receiving IV antibiotics, however he left against medical advice. Work up and hospital course as below by problem:     Acute on chronic dyspnea, fatigue, cough, L ear pain, jaw pain, hearing loss  Leukocytosis  Immunocompromised status  Presented with worsening dyspnea, cough, fatigue, ear pain, hearing loss, and night sweats for the past two months prior to admission. He has been treated with a variety of antibiotics without any relief. Patient is on chronic Doxy, Bactrim, and Valtrex for prophylaxis. Despite abx, patient presenting with acute onset of dypnea and cough. Patient also with complaints of ear fullness and pain. RPP negative in the ED. Lung sounds clear to auscultation bilaterally. CXR unremarkable. Had leukocytosis and fever at home; lactate and BNP normal. Started linezolid and Zosyn per ICID recommendations. 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 MRI brain revealed Signal abnormality in the left middle ear with left mastoid effusion.CTA chest negative for PE, did show upper lung predominant emphysema and chronic RUL fibrosis. Reassuringly he is now satting well on room air. Will obtain echocardiogram to evaluate for potential pulmonary hypertension. Unclear whether there is unifying diagnosis for his ongoing symptoms, but at this point we are concerned for infectious process. Less likely that these symptoms are due to his well-controlled ALL or his ponatinib. Continued on Linezolid and Zosyn. Infectious disease, ENT, and Pulm were all consulted. ENT recommended nasal saline, flonase, and afrin. ICID recommended antibiotic regimen. The left mastoid effusion did not change ENT's recommendations or management. Patient reported relief previously with steroids. ENT said okay to trial this.   Again, given his increase in pain and spread to face (no longer localized only on ear), both ICID and hematology recommended patient stay. He left against medical advice.     COPD  Acute on Chronic Dyspnea  Tobacco Use Disorder Continued formulary substitution for home inhaler and albuterol nebs prn. Pulm was consulted and recommended smoking cessation.      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 The course was complicated by septic shock, candida krusei fungemia, MRSA bacteremia with septic emboli c/b acute renal failure and respiratory distress requiring dialysis and intubation. 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 He subsequently had difficulty tolerating single agent dasatinib (as a bridge to planned ponatinib-blinatumomab--had fluid retention and pleural effusion). He then completed 4 cycles of ponatinib-blinatumomab, initiated for treatment of MRD in the setting of poor tolerance of standard therapy. He decided to forego bone marrow transplant. Continued his home ponatinib and ASA 81 mg daily.      Mood Disorder  Patient has ongoing trouble with mood and depression in the setting of chronic ailments. He used to like working in his woodshop, but cannot anymore due to pain and fatigue. He follows with Dr. [name] of [facility]. Continued home mirtazapine and nortriptyline. Hypertension  Resumed home amlodipine at 10 mg daily. 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 He was continued on his home Apixaban 5 mg BID.      AKI on CKD  Creatinine bumped to 2.12 on 3/31. Resolved with fluids. Urine studies consistent with a pre-renal injury. Renal ultrasound revealed medical renal disease; nonspecific. Patients creatinine returned to baseline on discharge.     Hematology/Oncology History Overview Note   Referring/Local Oncologist: None    Diagnosis:Ph+ ALL    Genetics:    Karyotype/FISH:Abnormal Karyotype: 46,XY,t(9;22)(q34;q11.2)[1]/45,XY,der(7;9)(q10;q10)t(9;22)(q34;q11.2),der(22)t(9;22)[11]/46,sdl,+der(22)t(9;22)[5]/46,XY[3]     Abnormal FISH: A BCR/ABL1 interphase FISH assay shows an abnormal signal pattern in 97% of the 100 cells scored. 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  The findings support a diagnosis of leukemia and have implications for targeted therapy and for monitoring residual disease.      Molecular Genetics:  BCR-ABL1 p210 transcripts were detected at  a level of 46.479 IS% ratio in bone marrow.  BCR-ABL1 p190 transcripts were detected at a level of 4 in 100,000 cells in bone marrow.    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)     01/24/2020 - 04/02/2020 Chemotherapy    IP/OP LEUKEMIA GRAAPH-2005 + RITUXIMAB < 60 YO  rituximab hypercvad (odd and even course)     02/24/2020 Remission    CR with PCR MRD+; marrow showing 70% blasts with <1% blasts, negative flow MRD, BCR-ABL p210 PCR 0.197%; CNS negative for blasts     03/09/2020 Progression    CSF - rare blast ID'ed - IT chemo given    03/13/20 - IT chemo, CSF negative     04/16/2020 Biopsy    BM Bx with 40-50% cellularity, <1% blasts, MRD flow cytometry negative, BCR-ABL p210 transcripts 0.036%     05/28/2020 - 05/28/2020 Chemotherapy    OP AML - CNS THERAPY (INTRATHECAL CYTARABINE, INTRATHECAL METHOTREXATE, OR INTRATHECAL TRIPLE)  Select one of the following: cytarabine IT 100 mg with hydrocortisone 50 mg, cytarabine IT 40 mg with methotrexate 15 mg with hydrocortisone 50 mg, OR methotrexate IT 12 mg with hydrocortisone 50 mg     06/19/2020 -  Chemotherapy    IP/OP LEUKEMIA BLINATUMOMAB 7-DAY INFUSION (MINIMAL RESIDUAL DISEASE; WT >= 22 KG) (HOME INFUSION)      Cycles 1*-4: Blinatumomab 28 mcg/day Days 1-28 of 6-week cycle.  *Given in the inpatient setting on Days 1-3 on Cycle 1 and Days 1-2 on Cycle 2, while other treatment days are given in the outpatient setting.    Cycle 1 MRD Dosing     07/18/2020 Adverse Reaction    Hospitalization: fevers. Pneumonia     07/23/2020 Biopsy    Diagnosis  Bone marrow, right iliac, aspiration and biopsy  -   Normocellular bone marrow (50%) with trilineage hematopoiesis and 1% blasts by manual aspirate differential  -   Flow cytometry MRD analysis reveals no definitive immunophenotypic evidence of residual B lymphoblastic leukemia   - BCR-ABL p210 0.006%         08/10/2020 -  Chemotherapy    Cycle 2 blinatumomab-ponatinib  Ponatinib 30mg     1 IT per cycle     09/21/2020 Adverse Reaction    Covid-19, symptomatic but not requiring hospitalization.     10/06/2020 -  Chemotherapy    Cycle 3 blinatumomab-ponatinib  Ponatinib 30mg        11/01/2020 Adverse Reaction    Hospitalization: MRSA Pneumonia requiring intubation    Blinatumomab stopped.  Ponatinib held until he stabilized.       12/14/2020 -  Chemotherapy    Cycle 4 blinatumomab 28 mcg/day days 1-28, ponatinib 30 mg per day IT triple therapy day 29     01/13/2021 -  Chemotherapy    Ponatinib 30mg  daily  - due to due low level BCR-ABL     03/06/2021 Adverse Reaction    Hospitalization - ICU on ventilator - influenza and MRSA/klebsiella PNA     06/13/2022 -  Chemotherapy    OP LEUKEMIA VINCRISTINE  vinCRIStine 2 mg IV on day 1     ALL (acute lymphoblastic leukemia) (CMS-HCC)   06/11/2020 -  Chemotherapy    IP/OP LEUKEMIA BLINATUMOMAB 7-DAY INFUSION (MINIMAL RESIDUAL DISEASE; WT >= 22 KG) (  HOME INFUSION)  Cycles 1*-4: Blinatumomab 28 mcg/day Days 1-28 of 6-week cycle.  *Given in the inpatient setting on Days 1-3 on Cycle 1 and Days 1-2 on Cycle 2, while other treatment days are given in the outpatient setting.     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  *Given in the inpatient setting on Days 1-3 on Cycle 1 and Days 1-2 on Cycle 2, while other treatment days are given in the outpatient setting.     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  -Recommended flexible fiberoptic laryngoscopy, which the patient declined.  Consider offering this test at a later date.  -Although less likely, patient has known immunocompromise state, which could put him at higher risk for atypical presentations of otologic infections.  Consider dedicated CT with contrast for further evaluation    Procedures:  Chemotherapy  No admission procedures for hospital encounter.  ______________________________________________________________________  Discharge Medications:     Your Medication List        ASK your doctor about these medications      albuterol 90 mcg/actuation inhaler  Commonly known as: PROVENTIL HFA;VENTOLIN HFA  Inhale 2 puffs every six (6) hours as needed for wheezing.     amlodipine 10 MG tablet  Commonly known as: NORVASC  Take 1 tablet (10 mg total) by mouth daily.     apixaban 5 mg Tab  Commonly known as: ELIQUIS  Take 1 tablet (5 mg total) by mouth two (2) times a day.     carvedilol 12.5 MG tablet  Commonly known as: COREG  Take 1 tablet (12.5 mg total) by mouth Two (2) times a day.     docusate sodium 100 MG capsule  Commonly known as: COLACE  Take 1 capsule (100 mg total) by mouth daily.     doxycycline 100 MG tablet  Commonly known as: VIBRA-TABS  Take 1 tablet (100 mg total) by mouth two (2) times a day.     hydroCHLOROthiazide 12.5 MG tablet  Take 1 tablet (12.5 mg total) by mouth daily.     ICLUSIG 30 mg tablet  Generic drug: PONATinib  Take 1 tablet (30 mg total) by mouth daily. Swallow tablets whole. Do not crush, break, cut or chew tablets.     mirtazapine 30 MG tablet  Commonly known as: REMERON  Take 1 tablet (30 mg total) by mouth nightly.     nortriptyline 50 MG capsule  Commonly known as: PAMELOR  TAKE 2 CAPSULES BY MOUTH EVERY DAY AT NIGHT     omeprazole 40 MG capsule  Commonly known as: PriLOSEC  Take 1 capsule (40 mg total) by mouth every morning.     ondansetron 4 MG disintegrating tablet  Commonly known as: ZOFRAN-ODT  Take 1  tablet (4 mg total) by mouth every eight (8) hours as needed for nausea.     oxyCODONE 5 MG immediate release tablet  Commonly known as: ROXICODONE  Take 1-2 tablets (5-10 mg total) by mouth every four (4) hours as needed for pain for up to 14 days.     spironolactone 25 MG tablet  Commonly known as: ALDACTONE  Take 1 tablet (25 mg total) by mouth daily.     STIOLTO RESPIMAT 2.5-2.5 mcg/actuation Mist  Generic drug: tiotropium-olodaterol  Inhale 2 puffs daily.     sulfamethoxazole-trimethoprim 800-160 mg per tablet  Commonly known as: BACTRIM DS  TAKE 1 TABLET BY MOUTH 2 TIMES A DAY ON SATURDAY, SUNDAY. FOR PROPHYLAXIS WHILE ON CHEMO.     valACYclovir 500 MG tablet  Commonly known as: VALTREX  Take 1 tablet (500 mg total) by mouth daily.     WRIST BRACE Misc  Generic drug: arm brace  1 Piece by Miscellaneous route as needed. left ulnar wrist brace              Allergies:  Bupropion hcl, Cefepime, Ceftaroline fosamil, Dapsone, Onion, Vancomycin analogues, Bismuth subsalicylate, Privigen [immun glob g(igg)-pro-iga 0-50], Furosemide, and Gammagard  ______________________________________________________________________  Pending Test Results (if blank, then none):  Pending Labs       Order Current Status    Aspergillus Galactomannan Antigen, Serum In process    Histoplasma Antigen, Urine In process    Blood Culture #1 Preliminary result    Blood Culture #2 Preliminary result            Most Recent Labs:  All lab results last 24 hours -   Recent Results (from the past 24 hour(s))   Type and Screen subsequent    Collection Time: 07/04/22  5:33 AM   Result Value Ref Range    Blood Type O POS     Select Screen 3 NEG    Basic Metabolic Panel    Collection Time: 07/04/22  5:33 AM   Result Value Ref Range    Sodium 142 135 - 145 mmol/L    Potassium 4.1 3.4 - 4.8 mmol/L    Chloride 110 (H) 98 - 107 mmol/L    CO2 27.0 20.0 - 31.0 mmol/L    Anion Gap 5 5 - 14 mmol/L    BUN 17 9 - 23 mg/dL    Creatinine 2.95 (H) 0.73 - 1.18 mg/dL    BUN/Creatinine Ratio 10     eGFR CKD-EPI (2021) Male 52 (L) >=60 mL/min/1.42m2    Glucose 108 70 - 179 mg/dL    Calcium 9.0 8.7 - 28.4 mg/dL   CBC w/ Differential    Collection Time: 07/04/22  5:33 AM   Result Value Ref Range    WBC 7.8 3.6 - 11.2 10*9/L    RBC 5.04 4.26 - 5.60 10*12/L    HGB 15.0 12.9 - 16.5 g/dL    HCT 13.2 44.0 - 10.2 %    MCV 87.3 77.6 - 95.7 fL    MCH 29.6 25.9 - 32.4 pg    MCHC 34.0 32.0 - 36.0 g/dL    RDW 72.5 (H) 36.6 - 15.2 %    MPV 8.9 6.8 - 10.7 fL    Platelet 131 (L) 150 - 450 10*9/L    Neutrophils % 62.9 %    Lymphocytes % 26.7 %    Monocytes % 6.7 %    Eosinophils % 2.6 %    Basophils % 1.1 %  Absolute Neutrophils 4.9 1.8 - 7.8 10*9/L    Absolute Lymphocytes 2.1 1.1 - 3.6 10*9/L    Absolute Monocytes 0.5 0.3 - 0.8 10*9/L    Absolute Eosinophils 0.2 0.0 - 0.5 10*9/L    Absolute Basophils 0.1 0.0 - 0.1 10*9/L       Relevant Studies/Radiology (if blank, then none):  Echocardiogram Follow Up/Limited Echo    Result Date: 07/04/2022  Patient Info Name:     Mani Ruckert Age:     41 years DOB:     1981/02/12 Gender:     Male MRN:     [id number] Accession #:     [id number] UN Account #:     [id number] Ht:     188 cm Wt:     134 kg BSA:     2.70 m2 BP:     126 /     74 mmHg Exam Date:     07/04/2022 7:56 AM Admit Date:     07/01/2022 Exam Type:     ECHOCARDIOGRAM FOLLOW UP/LIMITED ECHO Technical Quality:     Fair Staff Sonographer:     Lavonda Jumbo Reading Fellow:     Luretha Murphy Study Info Indications      - shortness of breath Procedure(s)   Limited 2D, color flow and Doppler transthoracic echocardiogram is performed. Strain analysis performed. Summary   1. Limited study to assess ventricular function.   2. The left ventricle is normal in size with upper normal wall thickness.   3. The left ventricular systolic function is normal, LVEF is visually estimated at > 55%.   4. The right ventricle is normal in size, with normal systolic function. Left Ventricle   The left ventricle is normal in size with upper normal wall thickness. The left ventricular systolic function is normal, LVEF is visually estimated at > 55%. There is normal left ventricular diastolic function. Right Ventricle   The right ventricle is normal in size, with normal systolic function. Tricuspid Valve   The tricuspid valve leaflets are poorly visualized but probably normal, with normal leaflet mobility. There is no significant tricuspid regurgitation. The pulmonary systolic pressure cannot be estimated due to insufficient TR signal. Inferior Vena Cava   IVC size and inspiratory change suggest normal right atrial pressure. (0-5 mmHg). Pericardium/Pleural   There is a trivial pericardial effusion. Ventricles ---------------------------------------------------------------------- Name                                 Value        Normal ---------------------------------------------------------------------- LV Dimensions 2D/MM ----------------------------------------------------------------------  IVS Diastolic Thickness (2D)                                1.1 cm       0.6-1.0 LVID Diastole (2D)                  4.4 cm       4.2-5.8 LVID Systole (2D)                   2.9 cm       2.5-4.0 Mitral Valve ---------------------------------------------------------------------- Name                                 Value        Normal ----------------------------------------------------------------------  MV Diastolic Function ---------------------------------------------------------------------- MV E Peak Velocity                102 cm/s               MV A Peak Velocity                 87 cm/s               MV E/A                                 1.2               MV Annular TDI ---------------------------------------------------------------------- MV Septal e' Velocity             9.1 cm/s         >=8.0 MV E/e' (Septal)                      11.2               MV Lateral e' Velocity           12.9 cm/s        >=10.0 MV E/e' (Lateral)                      7.9               MV e' Average                    11.0 cm/s               MV E/e' (Average)                      9.5 Tricuspid Valve ---------------------------------------------------------------------- Name                                 Value        Normal ---------------------------------------------------------------------- Estimated PAP/RSVP ---------------------------------------------------------------------- RA Pressure                         3 mmHg           <=5 Report Signatures Finalized by Glynn Octave  MD on 07/04/2022 10:07 AM Resident Kirk Ruths Ng on 07/04/2022 09:53 AM    US Renal Complete    Result Date: 07/03/2022  EXAM: US RENAL COMPLETE ACCESSION: [id number] UN CLINICAL INDICATION: 65 years old with acute kidney injury  COMPARISON: CT 02/22/2022. TECHNIQUE: Static and cine images of the kidneys and bladder were performed. FINDINGS: KIDNEYS: Normal size with mildly increased echogenicity. No solid masses or calculi. No hydronephrosis.      Right kidney: 11.6 cm      Left kidney: 11.1 cm BLADDER: Unremarkable.      Bladder volume prevoid: 95.1 mL          Mildly echogenic kidneys, compatible with medical renal disease. No hydronephrosis.    MRI Brain W Wo Contrast    Result Date: 07/02/2022  EXAM: Magnetic resonance imaging, brain without and with contrast material. DATE: 07/02/2022 3:13 PM ACCESSION: [id number] UN DICTATED: 07/02/2022 3:17 PM INTERPRETATION LOCATION: [facility] CLINICAL INDICATION: 42 years old Male with 90M with ALL, persistent ear pain and jaw pain with concern for infection (further eval inner  ear and mastoid process)  COMPARISON: MRI brain 03/23/2020 TECHNIQUE: Multiplanar, multisequence MR imaging of the brain was performed without and with I.V. contrast, including high resolution skull base images according to our cranial nerve VIII protocol. FINDINGS: RIGHT: No evidence of cerebellopontine angle mass or internal auditory canal lesion.  There are four cranial nerves within the internal auditory canals. Normal morphology of the inner ear structures. No abnormal enhancement. No restricted diffusion. No superior semicircular canal dehiscence. LEFT: No evidence of cerebellopontine angle mass or internal auditory canal lesion.  There are four cranial nerves within the internal auditory canals. Normal morphology of the inner ear structures. No abnormal enhancement. No restricted diffusion. No superior semicircular canal dehiscence. T2 hyperintense signal in the middle ear. Effusion of the mastoid air cells. The temporomandibular joints are normal. No surrounding soft tissue edema or enhancement. Ventricles are normal in size. No midline shift. There is no acute intracranial hemorrhage or acute infarct. Diminutive left V4 vertebral artery, unchanged.     --Signal normality in the left middle ear with left mastoid effusion. Findings are nonspecific though can be seen with otitis media. There is no evidence of intracranial extension, and no evidence of restricted diffusion to suggest cholesteatoma.     ECG 12 Lead    Result Date: 07/02/2022  NORMAL SINUS RHYTHM POSSIBLE LEFT ATRIAL ENLARGEMENT RIGHTWARD AXIS WHEN COMPARED WITH ECG OF 01-Jul-2022 15:12, NO SIGNIFICANT CHANGE WAS FOUND Confirmed by Aundra Dubin ([id number]) on 07/02/2022 3:06:41 PM    CTA Chest W Contrast    Result Date: 07/01/2022  EXAM: CTA CHEST W CONTRAST ACCESSION: [id number] UN CLINICAL INDICATION: Shortness of breath, dizziness TECHNIQUE: Contiguous axial images were reconstructed through the chest following a single breath hold helical acquisition during the administration of intravenous contrast material. Images were reformatted in the coronal and sagittal planes. MIP slabs were also constructed. COMPARISON: CTA chest 04/07/2022. FINDINGS: PULMONARY ARTERIES: Evaluation of the very distal pulmonary arterial vasculature is limited by respiratory motion. No central or paracentral pulmonary embolus. Main pulmonary artery is normal in size. HEART AND VASCULATURE: Cardiac chambers are normal in size. No pericardial effusion. Aorta is normal in caliber. Coronary atherosclerotic disease. LUNGS, AIRWAYS, AND PLEURA: Unchanged scarring and architectural distortion in the right upper lung. Upper lung-predominant emphysema. Trace tracheal debris. No pleural effusion or pneumothorax. MEDIASTINUM AND LYMPH NODES: No enlarged intrathoracic, axillary, or supraclavicular lymph nodes. No mediastinal mass or other abnormality. CHEST WALL AND BONES: Bilateral gynecomastia. UPPER ABDOMEN: No acute abnormality. OTHER: No other findings.     1. No evidence of pulmonary embolism. 2. Upper lung-predominant emphysema. Chronic right upper lobe fibrosis.    ECG 12 Lead    Result Date: 07/01/2022  NORMAL SINUS RHYTHM RIGHTWARD AXIS BORDERLINE ECG WHEN COMPARED WITH ECG OF 01-Jul-2022 11:25, NO SIGNIFICANT CHANGE WAS FOUND Confirmed by Christella Noa [phone]) on 07/01/2022 5:10:08 PM    ECG 12 Lead    Result Date: 07/01/2022  SINUS TACHYCARDIA RIGHTWARD AXIS BORDERLINE ECG WHEN COMPARED WITH ECG OF 12-May-2022 14:00, NO SIGNIFICANT CHANGE WAS FOUND Confirmed by Christella Noa [phone]) on 07/01/2022 2:31:36 PM    XR Chest 2 views    Result Date: 07/01/2022  EXAM: XR CHEST 2 VIEWS ACCESSION: [id number] UN CLINICAL INDICATION: CHEST PAIN  TECHNIQUE: PA and Lateral Chest Radiographs. COMPARISON: CTA chest 04/07/2022 and chest radiograph 05/12/2022 FINDINGS: Unchanged right upper lobe volume loss with apical pleural thickening and scarring. No pleural effusion or pneumothorax. Cardiac silhouette is normal in size.     No acute cardiopulmonary abnormalities.  ______________________________________________________________________  Discharge Instructions:                     Appointments which have been scheduled for you      Jul 05, 2022  1:30 PM  (Arrive by 1:15 PM)  HEARING AID CONSULT with Mauri Brooklyn, AUD  [facility] AUDIOLOGY [place] [address] [facility] REGION) [address]  [address]  [place] [address]  [phone]        Jul 06, 2022 10:20 AM  (Arrive by 9:50 AM)  RETURN HCP TELEPHONE with Howie Ill, RD/LDN  [facility] NUTRITION SERVICES CANCER HOSP [place] [facility] REGION) [address]  [place] [address]  [phone]        Jul 06, 2022  3:30 PM  (Arrive by 3:00 PM)  RETURN VIDEO MYCHART with [name], MD  [facility] HEMATOLOGY ONCOLOGY 2ND FLR CANCER HOSP [facility] REGION) [address]  [place] [address]  [phone]   Please sign into My [place] Chart at least 15 minutes before your appointment to complete the eCheck-In process. You must complete eCheck-In before you can start your video visit. We also recommend testing your audio and video connection to troubleshoot any issues before your visit begins. Click ???Join Video Visit??? to complete these checks. Once you have completed eCheck-In and tested your audio and video, click ???Join Call??? to connect to your visit.     For your video visit, you will need a computer with a working camera, speaker and microphone, a smartphone, or a tablet with internet access.    My [place] Chart enables you to manage your health, send non-urgent messages to your provider, view your test results, schedule and manage appointments, and request prescription refills securely and conveniently from your computer or mobile device.    You can go to [email or web address] to sign in to your My [place] Chart account with your username and password. If you have forgotten your username or password, please choose the ???Forgot Username???? and/or ???Forgot Password???? links to gain access. You also can access your My [place] Chart account with the free MyChart mobile app for Android or iPhone.    If you need assistance accessing your My [place] Chart account or for assistance in reaching your provider's office to reschedule or cancel your appointment, please call [facility] [phone].         Jul 20, 2022 10:20 AM  (Arrive by 10:05 AM)  PFT with PFT 3  [facility] PULMONARY SPECIALTY FUNCT [place]) [address]  [place] [address] through 4  [place] [address]  [phone] If you have inhaled breathing medications, please do not use it 4 hours prior to this breathing test.  Please wear comfortable clothing and well-fitting , closed toe shoes in the even that a 6-minute walk is part of your test.  If you have fallen in the past month please inform your respiratory therapist at the start of your appointment.    While you may bring a guest to your appointment, they will not be able to be present in the testing area.         Jul 20, 2022 11:00 AM  (Arrive by 10:45 AM)  RETURN  PULMONARY with Gaston Islam, DO  [facility] PULMONARY SPECIALTY CL [place]) [address]  [place] [address]  [place]  [place] [address]  [phone]        Jul 28, 2022 10:00 AM  (Arrive by 9:30 AM)  RETURN VIDEO MYCHART with Everlena Cooper, MD  [facility] HEMATOLOGY ONCOLOGY 2ND FLR CANCER HOSP [facility] REGION) [address]  [place] [address]  [phone]   Please sign into My [place] Chart at least 15 minutes before your appointment to complete the eCheck-In process. You must complete eCheck-In before you can start your video visit. We also recommend testing your audio and video connection to troubleshoot any issues before your visit begins. Click ???Join Video Visit??? to complete these checks. Once you have completed eCheck-In and tested your audio and video, click ???Join Call??? to connect to your visit.     For your video visit, you will need a computer with a working camera, speaker and microphone, a smartphone, or a tablet with internet access.    My [place] Chart enables you to manage your health, send non-urgent messages to your provider, view your test results, schedule and manage appointments, and request prescription refills securely and conveniently from your computer or mobile device.    You can go to [email or web address] to sign in to your My [place] Chart account with your username and password. If you have forgotten your username or password, please choose the ???Forgot Username???? and/or ???Forgot Password???? links to gain access. You also can access your My [place] Chart account with the free MyChart mobile app for Android or iPhone.    If you need assistance accessing your My [place] Chart account or for assistance in reaching your provider's office to reschedule or cancel your appointment, please call [facility] [phone].         Aug 25, 2022  9:00 AM  (Arrive by 8:45 AM)  ADD ON AUDIO CC with SPCAUD AUDIOLOGIST CC  [facility] AUDIOLOGY [place] [address] [facility] REGION) [address]  [address]  [place] [address]  [phone]        Aug 25, 2022  9:45 AM  RETURN ENT with Corey Harold, MD  [facility] OTOLARYNGOLOGY [address] [place] [facility] REGION) [phone] [address]  [place] [address]  [phone]        Aug 30, 2022  7:30 AM  (Arrive by 7:00 AM)  LAB ONLY [place] with ADULT ONC LAB  [facility] ADULT ONCOLOGY LAB DRAW STATION [place] [facility] REGION) [address]  [place] [address]  [phone]        Aug 30, 2022  8:30 AM  (Arrive by 8:00 AM)  RETURN ACTIVE [place] with Lenon Ahmadi, AGNP  [place] HEMATOLOGY ONCOLOGY 2ND FLR CANCER HOSP [facility] REGION) [address]  [place] [address]  [phone]        Sep 05, 2022  3:30 PM  (Arrive by 3:00 PM)  RETURN VIDEO MYCHART with Ward Givens, PMHNP  [facility] CCSP 2ND FLR CANCER HOSP [facility] REGION) [address]  [place] [address]  [phone]   Please sign into My  Chart at least 15 minutes before your appointment to complete the eCheck-In process. You must complete eCheck-In before you can start your video visit. We also recommend testing your audio and video connection to troubleshoot any issues before your visit begins. Click ???Join Video Visit??? to complete these checks. Once you have completed eCheck-In and tested your audio  and video, click ???Join Call??? to connect to your visit.     For your video visit, you will need a computer with a working camera, speaker and microphone, a smartphone, or a tablet with internet access.    My [place] Chart enables you to manage your health, send non-urgent messages to your provider, view your test results, schedule and manage appointments, and request prescription refills securely and conveniently from your computer or mobile device.    You can go to [email or web address] to sign in to your My [place] Chart account with your username and password. If you have forgotten your username or password, please choose the ???Forgot Username???? and/or ???Forgot Password???? links to gain access. You also can access your My [place] Chart account with the free MyChart mobile app for Android or iPhone.    If you need assistance accessing your My [place] Chart account or for assistance in reaching your provider's office to reschedule or cancel your appointment, please call [facility] [phone].         Sep 29, 2022  9:30 AM  (Arrive by 9:15 AM)  RETURN ENT with Linde Gillis Senior, MD  [place] OTOLARYNGOLOGY [address] [place] [facility] REGION) [address]  [address]  [place] [address]  [phone]             ______________________________________________________________________  Discharge Day Services:  BP 126/74  - Pulse 70  - Temp 36 ??C (96.8 ??F) (Oral)  - Resp 18  - Ht 188 cm (6' 2.02)  - Wt (!) 134.3 kg (296 lb 1.2 oz)  - SpO2 92%  - BMI 38.00 kg/m??   Pt seen on the day of discharge and determined appropriate for discharge.    Condition at Discharge: stable    Length of Discharge: I spent greater than 30 mins in the discharge of this patient.     Melvia Heaps, MD  PGY-1 Internal Medicine

## 2022-07-04 NOTE — Unmapped (Signed)
Pt rested on and off through the night, pain meds given as need. Pt monitored closely for signs of decline.    Problem: Adult Inpatient Plan of Care  Goal: Plan of Care Review  Outcome: Progressing  Flowsheets (Taken 07/04/2022 0233)  Progress: no change  Plan of Care Reviewed With: patient  Goal: Patient-Specific Goal (Individualized)  Outcome: Progressing  Goal: Absence of Hospital-Acquired Illness or Injury  Outcome: Progressing  Goal: Optimal Comfort and Wellbeing  Outcome: Progressing  Goal: Readiness for Transition of Care  Outcome: Progressing  Goal: Rounds/Family Conference  Outcome: Progressing     Problem: Nausea and Vomiting  Goal: Nausea and Vomiting Relief  Outcome: Progressing     Problem: Oncology Care  Goal: Effective Coping  Outcome: Progressing  Goal: Improved Activity Tolerance  Outcome: Progressing  Goal: Optimal Oral Intake  Outcome: Progressing  Goal: Improved Oral Mucous Membrane Integrity  Outcome: Progressing  Goal: Optimal Pain Control and Function  Outcome: Progressing     Problem: Pain Acute  Goal: Optimal Pain Control and Function  Outcome: Progressing

## 2022-07-04 NOTE — Unmapped (Signed)
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  Admitted for acute on chronic fever, dyspnea, fatigue, cough, L ear pain, hearing loss, jaw pain .    Palliative care consulted for symptom management in setting of acute L ear pain (mastoid infection).  Follows w/ [place]. Genice Rouge in [facility].  Patinet is adament about leaving today.      Symptom Assessment and Recommendations:   #Cancer related bony pain low back/hip:  well-managed on home regimen.  #L ear pain due to mastoid infection:  constant throbbing pain in L ear that radiates into jaw and temple.  10/10 at worst.  Opioids reduce pain by about 50%.  IV Dilaudid is the most effective for him.Marland Kitchen  He was taking oxycodone 5-10 mg (1-2x/daily) w/ 2 mg po Dilaudid for breakthrough pain (rarely) at home for bony pain.  It became much less effective as his ear pain worsened.  OME over last 24 hours 112.5 mg.  Current regimen:  Acetaminophen 1 gm TID  Methyl-prednisolone 20 mg taper per primary team  Dilaudid 1 mg IV q4 hours prn (x5/24)  Dilaudid 4 mg po q4 hours prn. (X5/24)  REC: starting Oxy-contin 30 mg BID w/ 10 mg oxycodone q4 hours prn.  He will follow-up w/ Dr. Genice Rouge [facility]) to wean LA Oxy-contin as pain improves w/ Abx/steroid.    #Bowel regimen w/ goal of BM every 1-2 days.  Current regimen:  Miralax 17 gm daily  Senna 2 tabletsBID     Goals of Care and Decision Making Assessment and Recommendations:     Prognosis / prognostic understanding:  not addressed today as visit focused on symptom management    Decisional capacity at time of visit:  he is fully decisional    Healthcare Decision Maker if lacks capacity:    HCDM (patient stated preference): Adam Keith, Adam Keith Spouse - [phone]    Advance Directive: no    Code status:   Code Status: Full Code         Current Goals of care:    -pain management.  -getting back to work.      Practical, Emotional, Spiritual Support Assessment and Recommendations:  -Palliative care visit today included focused interview, active listening, therapeutic use of silence, offering support, sharing empathy and summarization of today's discussion.          Recommendations shared with primary team in person        Thank you for this consult. Please contact  Trainer [place] PA-C  via Epic chat     or page Palliative Care if there are any questions.   Palliative Care plans to visit the patient again on -discharge today     Subjective:     HPI:  obtained from chart review.       Presents with worsening dyspnea, cough, fatigue, ear pain, hearing loss, and night sweats for the past two months prior to admission. He has been treated with a variety of antibiotics without any relief. Patient is on chronic Doxy, Bactrim, and Valtrex for prophylaxis.  Despite abx, patient presenting with acute onset of dypnea and cough. Patient also with complaints of ear fullness and pain.    Brain MRI - signal abnormality in the L middle ear with left mastoid effusion. Nonspecific findings but can be seen with otitis media. No evidence of intracranial extension and no evidence of cholesteatoma.  Started on Linezolid/Zosyn 3/29.        Symptom Severity, Assessment and Current Medication / Treatment:     Pain:  as documented above  Shortness of breath:  chronic due to underlying COPD,  worsened prior to admission but now better.  Nausea:  denies  Constipation:  denies.  Uses stool softners at home.  Other symptoms:      Psychosocial situation and relevant past history (medical, family, social):  -married.  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      Objective:       Function:  70% - Ambulation: Reduced / unable to do normal work, some evidence of disease / Self-Care: Full / Intake: Normal or reduced / Level of Conscious: Full    Temp:  [36 ??C (96.8 ??F)-37 ??C (98.6 ??F)] 36 ??C (96.8 ??F)  Heart Rate:  [70-87] 70  Resp:  [17-20] 18  BP: (121-151)/(66-93) 126/74  SpO2:  [90 %-98 %] 92 %    Physical Exam:  Sitting in chair eating lunch.  Appears comfortable.  Breathing is unlabored on RA.  Oriented x3.  CV: RRR  PULM: lungs clear  ABD: +BS, non-tender.  EXT: no edema/swelling.      Testing reviewed and interpreted:   Reviewed and interpreted test results for Cr elevated at 1.6  Qtc= 400 Albumin 3.8  affecting assessment of underlying illness severity and prognosis    I personally spent 60 minutes face-to-face and non-face-to-face in the care of this patient, which includes all pre, intra, and post visit time on the date of service.  All documented time was specific to the E/M visit and does not include any procedures that may have been performed.     See ACP Note from today for additional billable service:  No.

## 2022-07-04 NOTE — Unmapped (Signed)
Pt left unit with wife. Pt with belongings. Pt discharging to home. Pt hemodynamically stable prior to discharge. Discharge instructions provided to pt and wife, questions and concerns addressed.  Return precautions reviewed. PIV removed, skin intact.    Problem: Adult Inpatient Plan of Care  Goal: Plan of Care Review  Outcome: Resolved  Flowsheets (Taken 07/04/2022 1517)  Plan of Care Reviewed With: patient  Goal: Patient-Specific Goal (Individualized)  Outcome: Resolved  Goal: Absence of Hospital-Acquired Illness or Injury  Outcome: Resolved  Intervention: Identify and Manage Fall Risk  Recent Flowsheet Documentation  Taken 07/04/2022 0800 by Cristobal Goldmann, RN  Safety Interventions:   lighting adjusted for tasks/safety   family at bedside   low bed  Intervention: Prevent Skin Injury  Recent Flowsheet Documentation  Taken 07/04/2022 0900 by Sujata Maines, Maureen Ralphs, RN  Positioning for Skin: Standing  Device Skin Pressure Protection: adhesive use limited  Skin Protection: adhesive use limited  Taken 07/04/2022 0800 by Cristobal Goldmann, RN  Positioning for Skin: Supine/Back  Device Skin Pressure Protection: adhesive use limited  Skin Protection: adhesive use limited  Intervention: Prevent Infection  Recent Flowsheet Documentation  Taken 07/04/2022 0800 by Mollyann Halbert, Maureen Ralphs, RN  Infection Prevention: single patient room provided  Goal: Optimal Comfort and Wellbeing  Outcome: Resolved  Goal: Readiness for Transition of Care  Outcome: Resolved  Goal: Rounds/Family Conference  Outcome: Resolved     Problem: Nausea and Vomiting  Goal: Nausea and Vomiting Relief  Outcome: Resolved     Problem: Oncology Care  Goal: Effective Coping  Outcome: Resolved  Goal: Improved Activity Tolerance  Outcome: Resolved  Intervention: Promote Improved Energy  Recent Flowsheet Documentation  Taken 07/04/2022 0800 by Steffanie Mingle, Maureen Ralphs, RN  Activity Management: up ad lib  Goal: Optimal Oral Intake  Outcome: Resolved  Goal: Improved Oral Mucous Membrane Integrity  Outcome: Resolved  Intervention: Promote Oral Comfort and Health  Recent Flowsheet Documentation  Taken 07/04/2022 0800 by Yaret Hush, Maureen Ralphs, RN  Oral Mucous Membrane Protection:   nonirritating oral fluids promoted   nonirritating oral foods promoted  Goal: Optimal Pain Control and Function  Outcome: Resolved     Problem: Pain Acute  Goal: Optimal Pain Control and Function  Outcome: Resolved

## 2022-07-04 NOTE — Unmapped (Cosign Needed)
IMMUNOCOMPROMISED HOST INFECTIOUS DISEASE CONSULT NOTE    Kaishawn Riquelme is being seen in consultation at the request of Linna Hoff, MD for evaluation of Otitis Media with left mastoid effusion in immunosuppressed patient.       Assessment/Recommendations:    Caziah Stranger is a 42 y.o. 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 krusei fungemia  - Prior chemotherapy: [id number]-2005 induction with dasatinib (vincristine, dexamethasone and dasatinib); C1D1 01/24/2020; difficulty tolerating single-agent dasatinib  - Current chemotherapy: blinatumomab (anti-CD19/CD3) + ponatinib (TKI) last in Oct 2022; 01/2021 now on ponatinib monotherapy maintenance  - 01/11/21: Normocellular bone marrow (30% overall) with trilineage hematopoiesis and less than 1% blasts by manual aspirate differential; Flow cytometry MRD analysis reveals no definitive immunophenotypic evidence of residual B lymphoblastic leukemia; BCR-ABL p210 transcripts were detected at a level of 0.002 IS % ratio in bone marrow.  -06/24/21: Peripheral blood - lack of detectable BCR-ABL1 p210 RNA suggests molecular remission of leukemia  -11/2021: elected not to proceed with BMT     # Hypogammaglobulimia  - 11/01/20 IgG 266 declined IVIG  - 03/11/21 IgG 148 - IVIG infusion stopped as patient developed rigors, tachycardia and HTN during infusion despite pre-medication   - 03/12/21 IgG 213 - completed IVIG with premedications and slower rate of infusion  -03/17/21 IgG 632  -06/24/21 IgG 292 - IVIG infusion stopped as patient did not tolerate despite pre-medication  -10/19/21 IgG 321  - 12/02/21 IgG 284     # Mild CKD  baseline Cr 1.7     Pertinent Co-morbidities  # COPD/emphysema  # DIHS/DRESS ceftaroline 06/12/2020, relapse after cefepime 11/02/2020  - per prior ID notes possible culprits ceftaroline, posaconazole, dasatinib, sotrovimab  # GERD on omeprazole, 8/2  # Hearing loss; new onset L sided hearing loss, 10/2021         Pertinent Exposure History   Active smoking  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 Nonspecific findings but can be seen with otitis media. No evidence of intracranial extension and no evidence of cholesteatoma.   - 4/1: patient noting worsening L sided face pain/eye pain. 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 Juliene Pina felt chest pain is appropriate in setting of medically managed empyema and risk of thoracotomy outweighed benefit given clinical symptoms  - 03/05/21 MRSA pneumonia after influenza infection requiring ICU admission/intubation  - 03/14/21 CT Chest : Multifocal consolidative airspace disease, nonspecific small pleural effusion in the dependent aspect of the left pleural space and trace loculated collections within the lateral aspect of the right chest.  - 07/01/22 - presented  to ED with chills/fatigue/dyspnea   - 07/01/22 - CXR with no acute process. 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  # Recurrent nasal congestion/ L maxillary sinusitis in setting of L posterior and R anterior septal deviation, 10/11/21  #COVID (+) - s/p Molnupirovir 05/31/22     Antimicrobial Intolerance/allergy  Cefepime - DRESS/DiHS  Ceftaroline - DRESS/DiHS  Dapsone - possible agranulocytosis, per chart anaphylaxis  Vancomycin - probable ototoxicity (in combination with furosemide)  Isavuconazole - elevated LFTs in the setting of TKI  Linezolid - peripheral neuropathy; previously tolerated tedizolid  Aztreonam+linezolid - patient reported ear pain and worsening hearing (in the setting COVID-19)       RECOMMENDATIONS    Diagnosis  4/1 - patient with worsening L sided facial/eye pain. Awaiting ENT recs   3/31 Brain Mri with evidence of otitis media with L mastoid effusion. No intracranial extension or evidence of cholesteatoma - Would appreciate ENT thoughts on patient's brain MRI   FU 3/29 Blood Cx - NGTD  FU Hep B Surface Ag (-), Hep C Ab non-reactive, 3/29 Galactomannan pending    Management  Continue Linezolid 600mg  BID  Continue IV Zosyn 4.5 q6hrs   If patient discharges, ok to send out on PO Doxycycline 100mg  (chronic suppression)        And PO Levaquin 750mg  daily x 10 days (EOT: 07/11/2022)    Antimicrobial prophylaxis required for host deficiency: chemotherapy  Cont Valacyclovir (CMV/HSV ppx)     Intensive toxicity monitoring for prescription antimicrobials   CBC w/diff at least once per week  CMP at least once per week  clinical assessments for rashes or other skin changes  >>> PENICILLINS --> CBC/diff weekly -- CMP weekly    The [facility] ID service will sign off and arrange outpatient ID follow up.  Message sent to [facility] scheduling team to have patient seen in the clinic with Dr. [name] next week. They will be in touch with patient regarding appt date/time.           Please page the ID Transplant/Liquid Oncology Fellow consult at [phone] with questions.  Patient discussed with Dr. Warrick Parisian.    Inocencio Homes, DO  [place] [facility] of Infectious Diseases    History of Present Illness:      External record(s): Consultant note(s): NAEO. Patient with some complaints of L sided jaw/facial pain overnight .    Independent historian(s): no independent historian required.       INTERVAL HISTORY:   NAEO. VSS on RA. Patient states that his L sided facial pain is worse and he is now having L eye pain - no vision changes. He is aware that we will inquire with our ENT colleagues about next  steps and that we will continue the same abx regimen.     Vital Signs last 24 hours:  Temp:  [36 ??C (96.8 ??F)-37 ??C (98.6 ??F)] 36 ??C (96.8 ??F)  Heart Rate:  [70-87] 70  Resp:  [17-20] 18  BP: (121-151)/(66-93) 126/74  MAP (mmHg):  [80-109] 80  SpO2:  [90 %-98 %] 92 %    Physical Exam:  Patient Lines/Drains/Airways Status       Active Active Lines, Drains, & Airways       Name Placement date Placement time Site Days    Peripheral IV 07/01/22 Anterior;Distal;Left;Upper Arm 07/01/22  1403  Arm  2                  Const [x]  vital signs above    [x]  NAD, non-toxic appearance []  Chronically ill-appearing, non-distressed  Sitting on the edge of his bed with wife at bedside.       Eyes [x]  Lids normal bilaterally, conjunctiva anicteric and noninjected OU     [] PERRL  [] EOMI        ENMT [x]  Normal appearance of external nose and ears, no nasal discharge        []  MMM, no lesions on lips or gums []  No thrush, leukoplakia, oral lesions  []  Dentition good []  Edentulous []  Dental caries present  []  Hearing normal  []  TMs with good light reflexes bilaterally         Neck [x]  Neck of normal appearance and trachea midline        []  No thyromegaly, nodules, or tenderness   []  Full neck ROM  Some tenderness over the TMJ and sinuses. Unable to appreciate lymphadenopathy.       Lymph [x]  No LAD in neck     []  No LAD in supraclavicular area     []  No LAD in axillae   []  No LAD in epitrochlear chains     []  No LAD in inguinal areas        CV [x]  RRR            [x]  No peripheral edema     []  Pedal pulses intact   [x]  No abnormal heart sounds appreciated   []  Extremities WWP         Resp [x]  Normal WOB at rest on RA   []  No breathlessness with speaking, no coughing  []  CTA anteriorly    []  CTA posteriorly    (+) crackles in upper R listening post.       GI [x]  Normal inspection, NTND   [x]  NABS     []  No umbilical hernia on exam       []  No hepatosplenomegaly     []  Inspection of perineal and perianal areas normal        GU []  Normal external genitalia     [] No urinary catheter present in urethra   []  No CVA tenderness    []  No tenderness over renal allograft  Deferred      MSK []  No clubbing or cyanosis of hands       []  No vertebral point tenderness  []  No focal tenderness or abnormalities on palpation of joints in RUE, LUE, RLE, or LLE        Skin [x]  No rashes, lesions, or ulcers of visualized skin     []  Skin warm and dry to palpation         Neuro [x]  Face expression symmetric  []  Sensation to  light touch grossly intact throughout    [x]  Moves extremities equally    []  No tremor noted        []  CNs II-XII grossly intact     []  DTRs normal and symmetric throughout []  Gait unremarkable        Psych [x]  Appropriate affect       []  Fluent speech         []  Attentive, good eye contact  [x]  Oriented to person, place, time          []  Judgment and insight are appropriate   Calm.         Data for Medical Decision Making     (07/01/22) EKG QTcF: 403    I discussed mgm't w/qualified health care professional(s) involved in case: Recs discussed with primary team .    I reviewed CBC results (WBC down-trending) and chemistry results (Cr up-trending).    I independently visualized/interpreted not done.       Recent Labs   Lab Units 07/04/22  0533 07/03/22  0506 07/02/22  0557   WBC 10*9/L 7.8   < > 14.5*   HEMOGLOBIN g/dL 16.1   < > 09.6*   PLATELET COUNT (1) 10*9/L 131*   < > 137*   NEUTRO ABS 10*9/L 4.9   < > 12.2*   LYMPHO ABS 10*9/L 2.1   < > 1.4   EOSINO ABS 10*9/L 0.2   < > 0.2   BUN mg/dL 17   < > 17   CREATININE mg/dL 0.45*   < > 4.09*   AST U/L  --   --  21   ALT U/L  --   --  70*   BILIRUBIN TOTAL mg/dL  --   --  0.7   ALK PHOS U/L  --   --  87   POTASSIUM mmol/L 4.1   < > 4.0   CALCIUM mg/dL 9.0   < > 8.9    < > = values in this interval not displayed.       Lab Results   Component Value Date    CRP 44.0 (H) 12/28/2020    Total IgG 284 (L) 12/02/2021       Microbiology:  Microbiology Results (last day)       Procedure Component Value Date/Time Date/Time    Blood Culture #2 [[id number]]  (Normal) Collected: 07/01/22 1233    Lab Status: Preliminary result Specimen: Blood from 1 Peripheral Draw Updated: 07/03/22 1315     Blood Culture, Routine No Growth at 48 hours    Blood Culture #1 [[id number]]  (Normal) Collected: 07/01/22 1233    Lab Status: Preliminary result Specimen: Blood from 1 Peripheral Draw Updated: 07/03/22 1315     Blood Culture, Routine No Growth at 48 hours            Imaging:  Echocardiogram Follow Up/Limited Echo    Result Date: 07/04/2022  Patient Info Name:     Latoya Manjarres Age:     41 years DOB:     Sep 11, 1980 Gender:     Male MRN:     [id number] Accession #:     [id number] UN Account #:     [id number] Ht:     188 cm Wt:     134 kg BSA:     2.70 m2 BP:     126 /     74 mmHg Exam Date:     07/04/2022 7:56 AM Admit Date: 07/01/2022  Exam Type:     ECHOCARDIOGRAM FOLLOW UP/LIMITED ECHO     Technical Quality:     Fair     Staff Sonographer:     Lavonda Jumbo Reading Fellow:     Luretha Murphy     Study Info Indications      - shortness of breath Procedure(s)   Limited 2D, color flow and Doppler transthoracic echocardiogram is performed. Strain analysis performed.         Summary   1. Limited study to assess ventricular function.   2. The left ventricle is normal in size with upper normal wall thickness.   3. The left ventricular systolic function is normal, LVEF is visually estimated at > 55%.   4. The right ventricle is normal in size, with normal systolic function.         Left Ventricle   The left ventricle is normal in size with upper normal wall thickness. The left ventricular systolic function is normal, LVEF is visually estimated at > 55%. There is normal left ventricular diastolic function.     Right Ventricle   The right ventricle is normal in size, with normal systolic function.         Tricuspid Valve   The tricuspid valve leaflets are poorly visualized but probably normal, with normal leaflet mobility. There is no significant tricuspid regurgitation. The pulmonary systolic pressure cannot be estimated due to insufficient TR signal.         Inferior Vena Cava   IVC size and inspiratory change suggest normal right atrial pressure. (0-5 mmHg).     Pericardium/Pleural   There is a trivial pericardial effusion.         Ventricles ---------------------------------------------------------------------- Name                                 Value        Normal ----------------------------------------------------------------------     LV Dimensions 2D/MM ----------------------------------------------------------------------  IVS Diastolic Thickness (2D)                                1.1 cm       0.6-1.0 LVID Diastole (2D)                  4.4 cm       4.2-5.8 LVID Systole (2D)                   2.9 cm       2.5-4.0     Mitral Valve ---------------------------------------------------------------------- Name                                 Value        Normal ----------------------------------------------------------------------     MV Diastolic Function ---------------------------------------------------------------------- MV E Peak Velocity                102 cm/s               MV A Peak Velocity                 87 cm/s               MV E/A  1.2                   MV Annular TDI ---------------------------------------------------------------------- MV Septal e' Velocity             9.1 cm/s         >=8.0 MV E/e' (Septal)                      11.2               MV Lateral e' Velocity           12.9 cm/s        >=10.0 MV E/e' (Lateral)                      7.9               MV e' Average                    11.0 cm/s               MV E/e' (Average)                      9.5     Tricuspid Valve ---------------------------------------------------------------------- Name                                 Value        Normal ----------------------------------------------------------------------     Estimated PAP/RSVP ---------------------------------------------------------------------- RA Pressure                         3 mmHg           <=5         Report Signatures Finalized by Glynn Octave  MD on 07/04/2022 10:07 AM Resident Kirk Ruths Ng on 07/04/2022 09:53 AM    US Renal Complete    Result Date: 07/03/2022  EXAM: US RENAL COMPLETE ACCESSION: [id number] UN     CLINICAL INDICATION: 60 years old with acute kidney injury      COMPARISON: CT 02/22/2022.     TECHNIQUE: Static and cine images of the kidneys and bladder were performed.     FINDINGS:     KIDNEYS: Normal size with mildly increased echogenicity. No solid masses or calculi. No hydronephrosis.      Right kidney: 11.6 cm      Left kidney: 11.1 cm     BLADDER: Unremarkable.      Bladder volume prevoid: 95.1 mL          Mildly echogenic kidneys, compatible with medical renal disease. No hydronephrosis.    MRI Brain W Wo Contrast    Result Date: 07/02/2022  EXAM: Magnetic resonance imaging, brain without and with contrast material. DATE: 07/02/2022 3:13 PM ACCESSION: [id number] UN DICTATED: 07/02/2022 3:17 PM INTERPRETATION LOCATION: [facility]     CLINICAL INDICATION: 42 years old Male with 41M with ALL, persistent ear pain and jaw pain with concern for infection (further eval inner ear and mastoid process)      COMPARISON: MRI brain 03/23/2020     TECHNIQUE: Multiplanar, multisequence MR imaging of the brain was performed without and with I.V. contrast, including high resolution skull base images according to our cranial nerve VIII protocol.     FINDINGS: RIGHT: No evidence of cerebellopontine angle mass or internal auditory canal  lesion.  There are four cranial nerves within the internal auditory canals. Normal morphology of the inner ear structures. No abnormal enhancement. No restricted diffusion. No superior semicircular canal dehiscence.     LEFT: No evidence of cerebellopontine angle mass or internal auditory canal lesion.  There are four cranial nerves within the internal auditory canals. Normal morphology of the inner ear structures. No abnormal enhancement. No restricted diffusion. No superior semicircular canal dehiscence. T2 hyperintense signal in the middle ear. Effusion of the mastoid air cells.     The temporomandibular joints are normal. No surrounding soft tissue edema or enhancement.     Ventricles are normal in size. No midline shift. There is no acute intracranial hemorrhage or acute infarct. Diminutive left V4 vertebral artery, unchanged.         --Signal normality in the left middle ear with left mastoid effusion. Findings are nonspecific though can be seen with otitis media. There is no evidence of intracranial extension, and no evidence of restricted diffusion to suggest cholesteatoma.         ECG 12 Lead    Result Date: 07/02/2022  NORMAL SINUS RHYTHM POSSIBLE LEFT ATRIAL ENLARGEMENT RIGHTWARD AXIS WHEN COMPARED WITH ECG OF 01-Jul-2022 15:12, NO SIGNIFICANT CHANGE WAS FOUND Confirmed by Aundra Dubin ([id number]) on 07/02/2022 3:06:41 PM    CTA Chest W Contrast    Result Date: 07/01/2022  EXAM: CTA CHEST W CONTRAST ACCESSION: [id number] UN     CLINICAL INDICATION: Shortness of breath, dizziness     TECHNIQUE: Contiguous axial images were reconstructed through the chest following a single breath hold helical acquisition during the administration of intravenous contrast material. Images were reformatted in the coronal and sagittal planes. MIP slabs were also constructed.     COMPARISON: CTA chest 04/07/2022.     FINDINGS: PULMONARY ARTERIES: Evaluation of the very distal pulmonary arterial vasculature is limited by respiratory motion. No central or paracentral pulmonary embolus. Main pulmonary artery is normal in size.     HEART AND VASCULATURE: Cardiac chambers are normal in size. No pericardial effusion. Aorta is normal in caliber. Coronary atherosclerotic disease.     LUNGS, AIRWAYS, AND PLEURA: Unchanged scarring and architectural distortion in the right upper lung. Upper lung-predominant emphysema. Trace tracheal debris.     No pleural effusion or pneumothorax.     MEDIASTINUM AND LYMPH NODES: No enlarged intrathoracic, axillary, or supraclavicular lymph nodes. No mediastinal mass or other abnormality.     CHEST WALL AND BONES: Bilateral gynecomastia.     UPPER ABDOMEN: No acute abnormality.     OTHER: No other findings.                 1. No evidence of pulmonary embolism. 2. Upper lung-predominant emphysema. Chronic right upper lobe fibrosis.    ECG 12 Lead    Result Date: 07/01/2022  NORMAL SINUS RHYTHM RIGHTWARD AXIS BORDERLINE ECG WHEN COMPARED WITH ECG OF 01-Jul-2022 11:25, NO SIGNIFICANT CHANGE WAS FOUND Confirmed by Christella Noa [id number] on 07/01/2022 5:10:08 PM    ECG 12 Lead    Result Date: 07/01/2022  SINUS TACHYCARDIA RIGHTWARD AXIS BORDERLINE ECG WHEN COMPARED WITH ECG OF 12-May-2022 14:00, NO SIGNIFICANT CHANGE WAS FOUND Confirmed by Christella Noa [phone]) on 07/01/2022 2:31:36 PM    XR Chest 2 views    Result Date: 07/01/2022  EXAM: XR CHEST 2 VIEWS ACCESSION: [id number] UN     CLINICAL INDICATION: CHEST PAIN      TECHNIQUE: PA and Lateral Chest Radiographs.     COMPARISON:  CTA chest 04/07/2022 and chest radiograph 05/12/2022     FINDINGS:     Unchanged right upper lobe volume loss with apical pleural thickening and scarring. No pleural effusion or pneumothorax.     Cardiac silhouette is normal in size.             No acute cardiopulmonary abnormalities.

## 2022-07-05 ENCOUNTER — Institutional Professional Consult (permissible substitution): Admit: 2022-07-05 | Discharge: 2022-07-06 | Payer: MEDICAID

## 2022-07-05 LAB — ASPERGILLUS GALACTOMANNAN ANTIGEN, SERUM: ASPERGILLUS AG SERUM: <0.5 (ref <0.5)

## 2022-07-05 MED ORDER — METHYLPREDNISOLONE 4 MG TABLET
ORAL_TABLET | ORAL | 0 refills | 5 days | Status: CP
Start: 2022-07-05 — End: 2022-07-10
  Filled 2022-07-04: qty 15, 5d supply, fill #0

## 2022-07-05 NOTE — Unmapped (Signed)
[facility]  Adult Audiology     HEARING AID CONSULTATION     PATIENT: Adam Keith, Adam Keith  DOB: November 26, 1980  MRN: [id number]  DOS: 07/05/2022    Adam Keith is seen today to discuss amplification options. He attended today's appointment with his wife, Adam Keith, to today's appointment.    Patient reported a decline in hearing in the LEFT ear following recent hospital visit. An audiogram was recommended, however, patient declined reporting that he is on a lot of medication and in a lot of pain and felt like the hearing test was going to make things worse. He notes that he will follow up with his ENT, Dr. Melton Krebs, regarding next steps.      SUBJECTIVE REPORT     Patient identified the following listening needs/goals:  Hearing in busy work environment/emergency situations     CLINICAL OBSERVATIONS     Considerations reported today that should be taken into account when considering hearing aid options:  []  Manual dexterity challenges  []  Visual acuity  []  Known cognitive concerns (i.e., memory loss)  [x]  Known RIGHT profound hearing loss    IMPRESSIONS     Discussed amplification style and technology level best suited for patient's lifestyle. Due to reported concerns/considerations as well as patient listening needs/goals, the following features were identified as important to the patient:  [x]  Hearing Aid style:  CROS/BiCROS system  [x]  Rechargeable batteries  [x]  Bluetooth connectivity. Patient has the following smartphone model: Android  []  Accessories: TV streamer, Remote microphone    PLAN     The following hearing aid manufacturers and models were discussed today:   Signia Pure Charge & Go +  CROS  Phonak Audeo L-R + CROS P-R     Patient provided with literature from the [facility]).     COUNSELING     Adam Keith was counseled regarding realistic expectations and the role of the audiologist.    A price quote was signed and provided to patient. Patient was provided contact information for our financial counselor should Adam Keith have any financial concerns or questions.    [place] Telecommunications Program: Due to financial concerns, the [facility] Telecommunications program was discussed today. Patient was counseled regarding purpose for the state program in assisting with phone communication via Production assistant, radio.     Patient meets the audiologic requirements for eligibility to the state program. Telecommunication Program's Certification & Documentation of Equipment Need form along with patient's audiogram were provided to patient today.   Audiogram for Adam Keith program MUST include AC and BC thresholds BILATERALLY and thresholds at 6000 Hz must be included. Most recent audiogram did not include this information so additional testing was completed today in order to provide the [facility] program with the a complete audiogram.       Patient was given instructions and contact information to the appropriate [place] regional contact center.    [facility]: [phone], Toll Free: [phone]   Additional, up-to-date contact information for each regional center can be found here: [email or web address]    Policies:  In accordance with the [facility] protocol, Applicant to return to clinic for the following follow-up visits after the initial hearing aid fitting: within 30 days, in 3 months, and in 9 months.  Applicant has a 30 day trial period that begins on the first day following fitting.    Returns can be made to the clinic within 30 days at no cost to the patient  If a hearing  aid is returned to the contractor after the expiration of the 30 day trial period, the Contractor shall forward the hearing aid to the Division.  The Contractor shall provide maintenance and repair services for the hearing aid, at no additional cost to the Division or the Recipient, for a term of twelve months or for the term of the manufacturer's warranty, whichever is longer.  Patient is responsible for out-of-warranty charges and maintenance fees.  During the warranty period, a one-time loss and damage replacement is allowed if an aid is lost for damaged beyond repair.  The deductible for the one-time loss and damage replacement is $384 and is the patient's responsibility.    ORDERING - upon approval from the state     Hearing aid(s):   RIGHT Ear LEFT Ear   Manufacturer Signia Signia   Model CROS Pure Charge & Go Pure Charge & Go T   Hearing Aid Fitting Date N/A N/A   Color Black Black   Receiver Size and Strength N/A 62M   Dome medium CLOSED medium CLOSED   Battery Rechargeable Rechargeable     RECOMMENDATIONS    Return for hearing aid fitting pending state approval   Continue to monitor hearing annually    Vale Haven, B.S.  Audiology Graduate Student Clinician    I was physically present and immediately available to direct and supervise tasks that were related to patient management. The direction and supervision was continuous throughout the time these tasks were performed.    Care Provider(s) wore appropriate PPE throughout entire appointment.     Charges associated with today's visit:  No Charge - Quantity: 4    Visit Time: 60 min    Vale Haven, AuD  Clinical Audiologist  [facility] Adult Audiology Program  Scheduling: [phone]

## 2022-07-05 NOTE — Unmapped (Deleted)
[facility]  Adult Audiology     HEARING AID CONSULTATION     PATIENT: Jadrian, Murphy  DOB: 1980-09-30  MRN: [id number]  DOS: 07/05/2022    Baruch Gouty is seen today to discuss amplification options.      SUBJECTIVE REPORT     Patient identified the following listening needs/goals:  ***  ***  ***    Pertinent comments by family members and/or care givers: ***     CLINICAL OBSERVATIONS     Considerations reported today that should be taken into account when considering hearing aid options:  []  Manual dexterity challenges  []  Visual acuity  []  Known cognitive concerns (i.e., memory loss)    IMPRESSIONS     Discussed amplification style and technology level best suited for patient's lifestyle. Due to reported concerns/considerations as well as patient listening needs/goals, the following features were identified as important to the patient:  [x]  Hearing Aid style: RIC/RITE  [x]  Rechargeable batteries  [x]  Bluetooth connectivity. Patient has the following smartphone model: ***  [x]  Accessories: CROS    PLAN     The following hearing aid manufacturers and models were discussed today:   {HearingAidManufacturers:83896} *** {HearingAidStyles:83895}  {HearingAidManufacturers:83896} *** {HearingAidStyles:83895}  {HearingAidManufacturers:83896} *** {HearingAidStyles:83895}    Patient provided with literature from the [facility]).     COUNSELING     Jamarrion Longaker was counseled regarding realistic expectations and the role of the audiologist.    A price quote was signed and provided to patient. Patient was provided contact information for our financial counselor should Adonnis Penaflor have any financial concerns or questions.    ***[facility] Telecommunications Program: Due to financial concerns, the [facility] Telecommunications program was discussed today. Patient was counseled regarding purpose for the state program in assisting with phone communication via Production assistant, radio.     Patient ***meets the audiologic requirements for eligibility to the state program. Telecommunication Program's Certification & Documentation of Equipment Need form along with patient's audiogram were provided to patient today.   ***Audiogram for [place] [facility] program MUST include AC and BC thresholds BILATERALLY and thresholds at 6000 Hz must be included. ***Most recent audiogram did not include this information so additional testing was completed today in order to provide the [facility] program with the a complete audiogram.   Audiogram Here    Patient was given instructions and contact information to the appropriate [place] [facility] regional contact center.    {[facility]RegionalCenters:83899}  Additional, up-to-date contact information for each regional center can be found here: [email or web address]    Policies:  In accordance with the [facility] protocol, Applicant to return to clinic for the following follow-up visits after the initial hearing aid fitting: within 30 days, in 3 months, and in 9 months.  Applicant has a 30 day trial period that begins on the first day following fitting.    Returns can be made to the clinic within 30 days at no cost to the patient  If a hearing aid is returned to the contractor after the expiration of the 30 day trial period, the Contractor shall forward the hearing aid to the Division.  The Contractor shall provide maintenance and repair services for the hearing aid, at no additional cost to the Division or the Recipient, for a term of twelve months or for the term of the manufacturer's warranty, whichever is longer.  Patient is responsible for out-of-warranty charges and maintenance fees.  During the warranty period, a one-time loss and damage replacement is allowed if  an aid is lost for damaged beyond repair.  The deductible for the one-time loss and damage replacement is $384 and is the patient's responsibility.    EARMOLD IMPRESSION(S)      Otoscopy  RIGHT Ear: {otoscopy:64378::clear external auditory canal}  LEFT Ear: {otoscopy:64378::clear external auditory canal}    Cerumen Management: ***Cerumen was removed today without incident prior to completing hearing aid fitting procedures.    Earmold impression was taken on {Desc; right/left/bilateral:5002} without incident.     ORDERING - upon approval from the state     Hearing aid(s):   RIGHT Ear LEFT Ear   Manufacturer {hamanu:70224} {hamanu:70224}   Model {nachoice:70225} {nachoice:70225}    Serial Number {nachoice:70225} {nachoice:70225}   Hearing Aid Fitting Date {nachoice:70225} {nachoice:70225}   Warranty {nachoice:70225} {nachoice:70225}   Receiver Size and Strength {RECLENGTH:70226} {RECLENGTH:70226}   SlimTube Length  {STLENGTH:70227} {STLENGTH:70227}   Dome {domesize:70229} {DOMES:70228} {domesize:70229} {DOMES:70228}   Earmold {nachoice:70225} {nachoice:70225}   Bluetooth {BLUETOOTH:70230} {BLUETOOTH:70230}   Battery {battery:71346} {battery:71346}   Charger Serial Number {nachoice:70225} {nachoice:70225}   Charger Warranty {nachoice:70225} {nachoice:70225}   Loss and Damage Claim  {landd:71347} {landd:71347}     Earmold(s):   RIGHT Ear LEFT Ear   Manufacturer {hamanu:70224} {hamanu:70224}   Serial Number  {nachoice:70225} {nachoice:70225}   Style/Type  {emstyle:90852} {emstyle:90852}   Material  {emmaterial:90851} {emmaterial:90851}   Color  {nachoice:70225} {nachoice:70225}   Vent Size {nachoice:70225} {nachoice:70225}   Removal Option  {nachoice:70225} {nachoice:70225}     RECOMMENDATIONS      Consider trial with amplification pending medical clearance and patient motivation    Return for hearing aid fitting pending state approval   ***Schedule earmold impression visit upon approval from the state program    Continue to monitor hearing annually     {HA consult recommendations:96018}   {HA consult recommendations:96018}   {HA consult recommendations:96018}   {HA consult recommendations:96018}     ***, {Bachelor's ZOXWRU:04540}  Audiology Graduate Student Clinician    I was physically present and immediately available to direct and supervise tasks that were related to patient management. The direction and supervision was continuous throughout the time these tasks were performed.    Care Provider(s) ***wore appropriate PPE throughout entire appointment.     Charges associated with today's visit:  {UNCADULTCODINGHACONSULT:74145}    Visit Time: {unccivisittime:73518}    Mauri Brooklyn, AUD, AuD  Clinical Audiologist  [facility] Adult Audiology Program  Scheduling: [phone]

## 2022-07-06 ENCOUNTER — Telehealth: Admit: 2022-07-06 | Discharge: 2022-07-07 | Payer: MEDICAID | Attending: Hematology | Primary: Hematology

## 2022-07-06 ENCOUNTER — Ambulatory Visit: Admit: 2022-07-06 | Discharge: 2022-07-07 | Payer: MEDICAID | Attending: Registered" | Primary: Registered"

## 2022-07-06 DIAGNOSIS — C9101 Acute lymphoblastic leukemia, in remission: Principal | ICD-10-CM

## 2022-07-06 LAB — HISTOPLASMA ANTIGEN, URINE
HISTOPLASMA AG, URINE RESULT: NOT DETECTED
HISTOPLASMA AG, URINE VALUE: NOT DETECTED ng/mL

## 2022-07-06 MED ORDER — OXYCODONE ER 30 MG TABLET,CRUSH RESISTANT,EXTENDED RELEASE 12 HR
ORAL_TABLET | Freq: Two times a day (BID) | ORAL | 0 refills | 30 days | Status: CP
Start: 2022-07-06 — End: ?

## 2022-07-06 NOTE — Unmapped (Signed)
Reviewed Meds and Allergies, confirmed pharmacy and informed how to use MyChart

## 2022-07-06 NOTE — Unmapped (Signed)
Nice to see you today.     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 No changes until you start feeling much better when we would consider increasing the intensity of therapy.     2. Infections - Please reach out to the infectious disease team to reschedule your appt. Stay on the doxy and the levaquin as prescribed.     3. Ear pain - Please follow-up with the ENT docs about the steroids and pain.     We will see you back in 2 months with a video visit    Mariel Aloe, MD  Leukemia Program       Nurse Navigator (non-clinical trial patients): Elicia Lamp, RN        Tel. [phone]       Fax. [phone]  Toll-free appointments: [phone]  Scheduling assistance: [phone]  After hours/weekends: [phone] (ask for adult hematology/oncology on-call)      Lab Results   Component Value Date    WBC 7.8 07/04/2022    HGB 15.0 07/04/2022    HCT 44.0 07/04/2022    PLT 131 (L) 07/04/2022       Lab Results   Component Value Date    NA 142 07/04/2022    K 4.1 07/04/2022    CL 110 (H) 07/04/2022    CO2 27.0 07/04/2022    BUN 17 07/04/2022    CREATININE 1.67 (H) 07/04/2022    GLU 108 07/04/2022    CALCIUM 9.0 07/04/2022    MG 2.0 02/22/2022    PHOS 4.0 02/08/2022       Lab Results   Component Value Date    BILITOT 0.7 07/02/2022    BILIDIR 0.30 07/02/2022    PROT 6.5 07/02/2022    ALBUMIN 3.8 07/02/2022    ALT 70 (H) 07/02/2022    AST 21 07/02/2022    ALKPHOS 87 07/02/2022       Lab Results   Component Value Date    INR 0.91 04/07/2022    APTT 62.2 (H) 02/08/2022

## 2022-07-06 NOTE — Unmapped (Signed)
[facility] Leukemia Clinic    Patient Name: Adam Keith  Patient Age: 42 y.o.  Encounter Date: 07/06/2022    Primary Care Provider:  Dierdre Harness, MD    Referring Physician:  [name], MD  [address]  [place],  [place] [address]    Reason for visit:  Ph+ B-ALL follow up     Assessment:  A 42 y.o. 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 The course was complicated by septic shock, candida krusei fungemia, MRSA bacteremia with septic emboli c/b acute renal failure and respiratory distress requiring dialysis and intubation.  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  He subsequently had difficulty tolerating single agent dasatinib (as a bridge to planned ponatinib-blinatumomab--had fluid retention and pleural effusion).  He then completed 4 cycles of ponatinib-blinatumomab, initiated for treatment of MRD in the setting of poor tolerance of standard therapy.      Patient is currently on ponatinib 30 mg monotherapy (since 01/2021).  When we decreased his ponatinib to 15 mg he had a rise in transcripts. Most recent BCR-ABL is + at 0.001.     Recently admitted with shortness of breath, malaise, ear pain. C/w infection. CT chest negative. Improved on zosyn and linezolid, discharged on levaquin and doxycycline. Will stay on ppx doxy.     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   - No plan for further IVIG given several reactions   - Rec Flu and Covid vaccines     DRESS: followed by Dr. 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 Now s/p dialysis  - stable Cr appears to have developed CKD due to incompletely healed acute injury.  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 CT chest 02/06/22 with residual subacute thrombus.   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 [name] and Dr. Verlan Friends and Dr. Maple Mirza following patient.    - long term doxycycline with goal of decolonization  **Hx Candidemia during induction - now s/p Cresemba  - followed by Dr. [name]  **Hx Covid-19   -  symptoms now resolved    Symptom management: bowel regimen as listed in medication list, analgesics as listed in medication list or symptoms reviewed and due to acceptable control, no changes made  **Peripheral neuropathy, sensory and motor  - Grade 1-2 monitor  **Chronic Back Pain, complicated by hospital stay and procedures  - Followed by Dr. Genice Rouge, Palliative Care  **Depression/anxiety: met with [name], [facility]  - nortriptyline  - PRN clonazepam  - has not been using PRN hydroxyzine    Venous access: I recommend the following change for venous access : PICC line removal      Supportive Care Recommendations:  We recommend based on the patient???s underlying diagnosis and treatment history the following supportive care:    1. Antimicrobial prophylaxis:    - Viral - valacyclovir 500mg  daily  - Bacterial - levofloxacin 500 mg if ANC <0.5  - Fungal - previously on treatment isavuconazole - off now  - PJP - On Bactrim DS BID Sat/Sun  - recommend Annual flu shot - declines thus far  - recommend updated Covid vaccine - declines    2. Blood product support:  I recommend the following intervals for laboratory monitoring to determine transfusion needs and monitor for hematologic effects of therapy and underlying disease: no routine monitoring outside of clinic follow-up    Leukoreduced blood products are required.  Irradiated blood products are preferred, but in case of urgent transfusion needs non-irradiated blood products may be used:     -  RBC transfusion threshold: transfuse 2 units for Hgb < 8 g/dL.  -  Platelet transfusion threshold: transfuse 1 unit of platelets for platelet count < 10, or for bleeding or need for invasive procedure.    3. Hematopoietic growth factor support: none    Care coordination: The next [facility] Leukemia Clinic Follow up requested:  - 2 month followup    Mariel Aloe, MD   Leukemia Program  Division of Hematology  [facility]     Nurse Navigator (non-clinical trial patients): Elicia Lamp, RN        Tel. [phone]       Fax. [phone]  Toll-free appointments: [phone]  Scheduling assistance: [phone]  After hours/weekends: [phone] (ask for adult hematology/oncology on-call)            History of Present Illness:  We had the pleasure of seeing Adam Keith in the Leukemia Clinic at the [place] [facility] [place] on 07/06/2022.  He is a 42 y.o. male with  Ph+ B-ALL .          His oncologic history is as follows:      Hematology/Oncology History Overview Note   Referring/Local Oncologist: None    Diagnosis:Ph+ ALL    Genetics:    Karyotype/FISH:Abnormal Karyotype: 46,XY,t(9;22)(q34;q11.2)[1]/45,XY,der(7;9)(q10;q10)t(9;22)(q34;q11.2),der(22)t(9;22)[11]/46,sdl,+der(22)t(9;22)[5]/46,XY[3]     Abnormal FISH: A BCR/ABL1 interphase FISH assay shows an abnormal signal pattern in 97% of the 100 cells scored. 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  The findings support a diagnosis of leukemia and have implications for targeted therapy and for monitoring residual disease.      Molecular Genetics:  BCR-ABL1 p210 transcripts were detected at a level of 46.479 IS% ratio in bone marrow.  BCR-ABL1 p190 transcripts were detected at a level of 4 in 100,000 cells in bone marrow.    Pertinent Phenotypic data:    Disease-specific prognostic estimate:  High Risk, Ph+       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)   01/23/2020 Initial Diagnosis    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)     01/24/2020 - 04/02/2020 Chemotherapy    IP/OP LEUKEMIA GRAAPH-2005 + RITUXIMAB < 60 YO  rituximab hypercvad (odd and even course)     02/24/2020 Remission    CR with PCR MRD+; marrow showing 70% blasts with <1% blasts, negative flow MRD, BCR-ABL p210 PCR 0.197%; CNS negative for blasts     03/09/2020 Progression    CSF - rare blast ID'ed - IT chemo given    03/13/20 - IT chemo, CSF negative     04/16/2020 Biopsy    BM Bx with 40-50% cellularity, <1% blasts, MRD flow cytometry negative, BCR-ABL p210 transcripts 0.036%     05/28/2020 - 05/28/2020 Chemotherapy    OP AML - CNS THERAPY (INTRATHECAL CYTARABINE, INTRATHECAL METHOTREXATE, OR INTRATHECAL TRIPLE)  Select one of the following: cytarabine IT 100 mg with hydrocortisone 50 mg, cytarabine IT 40 mg with methotrexate 15 mg with hydrocortisone 50 mg, OR methotrexate IT 12 mg with hydrocortisone 50 mg 06/19/2020 -  Chemotherapy    IP/OP LEUKEMIA BLINATUMOMAB 7-DAY INFUSION (MINIMAL RESIDUAL DISEASE; WT >= 22 KG) (HOME INFUSION)      Cycles 1*-4: Blinatumomab 28 mcg/day Days 1-28 of 6-week cycle.  *Given in the inpatient setting on Days 1-3 on Cycle 1 and Days 1-2 on Cycle 2, while other treatment days are given in the outpatient setting.    Cycle 1 MRD Dosing     07/18/2020 Adverse Reaction    Hospitalization: fevers. Pneumonia     07/23/2020 Biopsy    Diagnosis  Bone marrow, right iliac, aspiration and biopsy  -   Normocellular bone marrow (50%) with trilineage hematopoiesis and 1% blasts by manual aspirate differential  -   Flow cytometry MRD analysis reveals no definitive immunophenotypic evidence of residual B lymphoblastic leukemia   - BCR-ABL p210 0.006%         08/10/2020 -  Chemotherapy    Cycle 2 blinatumomab-ponatinib  Ponatinib 30mg     1 IT per cycle     09/21/2020 Adverse Reaction    Covid-19, symptomatic but not requiring hospitalization.     10/06/2020 -  Chemotherapy    Cycle 3 blinatumomab-ponatinib  Ponatinib 30mg        11/01/2020 Adverse Reaction    Hospitalization: MRSA Pneumonia requiring intubation    Blinatumomab stopped.  Ponatinib held until he stabilized.       12/14/2020 -  Chemotherapy    Cycle 4 blinatumomab 28 mcg/day days 1-28, ponatinib 30 mg per day IT triple therapy day 29     01/13/2021 -  Chemotherapy    Ponatinib 30mg  daily  - due to due low level BCR-ABL     03/06/2021 Adverse Reaction    Hospitalization - ICU on ventilator - influenza and MRSA/klebsiella PNA     06/13/2022 -  Chemotherapy    OP LEUKEMIA VINCRISTINE  vinCRIStine 2 mg IV on day 1     ALL (acute lymphoblastic leukemia) (CMS-HCC)   06/11/2020 -  Chemotherapy    IP/OP LEUKEMIA BLINATUMOMAB 7-DAY INFUSION (MINIMAL RESIDUAL DISEASE; WT >= 22 KG) (HOME INFUSION)  Cycles 1*-4: Blinatumomab 28 mcg/day Days 1-28 of 6-week cycle.  *Given in the inpatient setting on Days 1-3 on Cycle 1 and Days 1-2 on Cycle 2, while other treatment days are given in the outpatient setting.  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  *Given in the inpatient setting on Days 1-3 on Cycle 1 and Days 1-2 on Cycle 2, while other treatment days are given in the outpatient setting.     11/09/2020 Initial Diagnosis    Acute lymphoblastic leukemia in remission (CMS-HCC)     06/13/2022 -  Chemotherapy    OP LEUKEMIA VINCRISTINE  vinCRIStine 2 mg IV on day 1         Interim History:    Since his last visit he was admitted with concern for infection. Treated with linezolid and zosyn. Feeling better now. Not as much pain. Ear pain waxes and wanes. Still with night sweats. No fevers. Hoping to get back to normal life.       Past Medical, Surgical and Family History were reviewed and pertinent updates were made in the Electronic Medical Record      ECOG Performance Status: 1    Medications:      Current Outpatient Medications   Medication Sig Dispense Refill    albuterol HFA 90 mcg/actuation inhaler Inhale 2 puffs every six (6) hours as needed for wheezing. 18 g 0    amLODIPine (NORVASC) 10 MG tablet Take 1 tablet (10 mg total) by mouth daily. 30 tablet 0    apixaban (ELIQUIS) 5 mg Tab Take 1 tablet (5 mg total) by mouth two (2) times a day. 60 tablet 6    arm brace (WRIST BRACE) Misc 1 Piece by Miscellaneous route as needed. left ulnar wrist brace 1 each 0    carvediloL (COREG) 12.5 MG tablet Take 1 tablet (12.5 mg total) by mouth Two (2) times a day. 180 tablet 1    docusate sodium (COLACE) 100 MG capsule Take 1 capsule (100 mg total) by mouth daily.      doxycycline (VIBRA-TABS) 100 MG tablet Take 1 tablet (100 mg total) by mouth two (2) times a day. 180 tablet 3    hydroCHLOROthiazide (HYDRODIURIL) 12.5 MG tablet Take 1 tablet (12.5 mg total) by mouth daily. 30 tablet 11    levoFLOXacin (LEVAQUIN) 750 MG tablet Take 1 tablet (750 mg total) by mouth daily for 10 days. 10 tablet 0    methylPREDNISolone (MEDROL) 4 MG tablet Take 5 tablets (20 mg total) by mouth daily for 1 day, THEN 4 tablets (16 mg total) daily for 1 day, THEN 3 tablets (12 mg total) daily for 1 day, THEN 2 tablets (8 mg total) daily for 1 day, THEN 1 tablet (4 mg total) daily for 1 day. 15 tablet 0    mirtazapine (REMERON) 30 MG tablet Take 1 tablet (30 mg total) by mouth nightly. 90 tablet 3    naloxone (NARCAN) 4 mg nasal spray One spray in either nostril once for known/suspected opioid overdose. May repeat every 2-3 minutes in alternating nostril til EMS arrives 2 each 0    nortriptyline (PAMELOR) 50 MG capsule TAKE 2 CAPSULES BY MOUTH EVERY DAY AT NIGHT 180 capsule 4    omeprazole (PRILOSEC) 40 MG capsule Take 1 capsule (40 mg total) by mouth every  morning.      ondansetron (ZOFRAN-ODT) 4 MG disintegrating tablet Take 1 tablet (4 mg total) by mouth every eight (8) hours as needed for nausea.      oxyCODONE (OXYCONTIN) 30 mg TR12 12 hr crush resistant ER/CR tablet Take 1 tablet (30 mg total) by mouth every twelve (12) hours. 10 tablet 0    oxyCODONE (ROXICODONE) 10 mg immediate release tablet Take 1 tablet (10 mg total) by mouth every four (4) hours as needed for pain for up to 5 days. 30 tablet 0    PONATinib (ICLUSIG) 30 mg tablet Take 1 tablet (30 mg total) by mouth daily. Swallow tablets whole. Do not crush, break, cut or chew tablets. 30 tablet 5    spironolactone (ALDACTONE) 25 MG tablet Take 1 tablet (25 mg total) by mouth daily. 90 tablet 3    sulfamethoxazole-trimethoprim (BACTRIM DS) 800-160 mg per tablet TAKE 1 TABLET BY MOUTH 2 TIMES A DAY ON SATURDAY, SUNDAY. FOR PROPHYLAXIS WHILE ON CHEMO. 48 tablet 3    tiotropium-olodaterol (STIOLTO RESPIMAT) 2.5-2.5 mcg/actuation Mist Inhale 2 puffs daily. 4 g 3    tiotropium-olodaterol (STIOLTO RESPIMAT) 2.5-2.5 mcg/actuation Mist Inhale 2 puffs daily. 4 g 0    tiotropium-olodaterol (STIOLTO RESPIMAT) 2.5-2.5 mcg/actuation Mist Inhale 2 puffs daily. 4 g 0    valACYclovir (VALTREX) 500 MG tablet Take 1 tablet (500 mg total) by mouth daily. 90 tablet 11     No current facility-administered medications for this visit.       Vital Signs:  There were no vitals filed for this visit.    Vitals reviewed in EMR 07/06/2022 - stable with slightly elevated BP    GENERAL: Well-appearing white man. NAD.   HEENT: Sunglasses  HEART: Normal color, not excessive pallor. Not ashen.   CHEST/LUNG: Normal work of breathing. No dyspnea with conversation.   EXTREMITIES: Not evaluated.   SKIN: No noticable rash or petechiae.   NEURO EXAM: Grossly intact.     MDM:  1 chronic life threatening illness  Drug therapy requiring intense monitoring for toxicity       The patient reports they are physically located in [place] and is currently: at home. I conducted a audio/video visit. I spent  65m 00s on the video call with the patient. I spent an additional 12 minutes on pre- and post-visit activities on the date of service .

## 2022-07-07 NOTE — Unmapped (Signed)
[facility]  Adult Audiology     HEARING AID CONSULTATION     PATIENT: Adam Keith, Adam Keith  DOB: Jan 20, 1981  MRN: [id number]  DOS: 07/05/2022    Baruch Gouty is seen today to discuss amplification options. He attended today's appointment with his wife, [name], to today's appointment.    Patient reported a decline in hearing in the LEFT ear following recent hospital visit. An audiogram was recommended, however, patient declined reporting that he is on a lot of medication and in a lot of pain and felt like the hearing test was going to make things worse. He notes that he will follow up with his ENT, Dr. Melton Krebs, regarding next steps.      SUBJECTIVE REPORT     Patient identified the following listening needs/goals:  Hearing in busy work environment/emergency situations     CLINICAL OBSERVATIONS     Considerations reported today that should be taken into account when considering hearing aid options:  []  Manual dexterity challenges  []  Visual acuity  []  Known cognitive concerns (i.e., memory loss)  [x]  Known RIGHT profound hearing loss    IMPRESSIONS     Discussed amplification style and technology level best suited for patient's lifestyle. Due to reported concerns/considerations as well as patient listening needs/goals, the following features were identified as important to the patient:  [x]  Hearing Aid style: CROS/BiCROS system  [x]  Rechargeable batteries  [x]  Bluetooth connectivity. Patient has the following smartphone model: Android  []  Accessories: TV streamer, Remote microphone    PLAN     The following hearing aid manufacturers and models were discussed today:   Signia Pure Charge & Go + CROS  Phonak Audeo L-R + CROS P-R     Patient provided with literature from the [facility]).     COUNSELING     Liang Stolle was counseled regarding realistic expectations and the role of the audiologist.    A price quote was signed and provided to patient. Patient was provided contact information for our financial counselor should Hussan Boyden have any financial concerns or questions.    [place] [facility] Telecommunications Program: Due to financial concerns, the [facility] Telecommunications program was discussed today. Patient was counseled regarding purpose for the state program in assisting with phone communication via Production assistant, radio.     Patient meets the audiologic requirements for eligibility to the state program. Telecommunication Program's Certification & Documentation of Equipment Need form along with patient's audiogram were provided to patient today.   Audiogram for [place] [facility] program MUST include AC and BC thresholds BILATERALLY and thresholds at 6000 Hz must be included. Most recent audiogram did not include this information so additional testing was completed today in order to provide the [facility] program with the a complete audiogram.       Patient was given instructions and contact information to the appropriate [place] [facility] regional contact center.    [facility]: [phone], Toll Free: [phone]   Additional, up-to-date contact information for each regional center can be found here: [email or web address]    Policies:  In accordance with the [facility] protocol, Applicant to return to clinic for the following follow-up visits after the initial hearing aid fitting: within 30 days, in 3 months, and in 9 months.  Applicant has a 30 day trial period that begins on the first day following fitting.    Returns can be made to the clinic within 30 days at no cost to the patient  If a hearing aid is  returned to the contractor after the expiration of the 30 day trial period, the Contractor shall forward the hearing aid to the Division.  The Contractor shall provide maintenance and repair services for the hearing aid, at no additional cost to the Division or the Recipient, for a term of twelve months or for the term of the manufacturer's warranty, whichever is longer.  Patient is responsible for out-of-warranty charges and maintenance fees.  During the warranty period, a one-time loss and damage replacement is allowed if an aid is lost for damaged beyond repair.  The deductible for the one-time loss and damage replacement is $384 and is the patient's responsibility.    ORDERING - upon approval from the state for the LEFT      Hearing aid(s):   RIGHT Ear LEFT Ear   Manufacturer Signia Signia   Model CROS Pure Charge & Go Pure Charge & Go T   Hearing Aid Fitting Date N/A N/A   Color Black Black   Receiver Size and Strength N/A 36M   Dome medium CLOSED medium CLOSED   Battery Rechargeable Rechargeable     RECOMMENDATIONS    Return for hearing aid fitting pending state approval   Continue to monitor hearing annually    Vale Haven, B.S.  Audiology Graduate Student Clinician    I was physically present and immediately available to direct and supervise tasks that were related to patient management. The direction and supervision was continuous throughout the time these tasks were performed.    Care Provider(s) wore appropriate PPE throughout entire appointment.     Charges associated with today's visit:  No Charge - Quantity: 4    Visit Time: 60 min    Cyndia Skeeters, Au.D.   Clinical Audiologist  [facility] Adult Audiology Program  Scheduling: [phone]

## 2022-07-19 ENCOUNTER — Ambulatory Visit: Admit: 2022-07-19 | Payer: MEDICAID | Attending: Registered" | Primary: Registered"

## 2022-07-19 NOTE — Unmapped (Signed)
RD attempted to reach pt at all listed contact numbers regarding scheduled nutrition phone visit. Left VM with contact information.

## 2022-07-19 NOTE — Unmapped (Signed)
[facility] Refill Coordination Note    Specialty Medication(s) to be Shipped:   Hematology/Oncology: Iclusig    Other medication(s) to be shipped: No additional medications requested for fill at this time     Siva Livaudais, DOB: Apr 30, 1980  Phone: [phone] (home) [phone] (work)      All above HIPAA information was verified with patient.     Was a Nurse, learning disability used for this call? No    Completed refill call assessment today to schedule patient's medication shipment from the [facility] [phone]).  All relevant notes have been reviewed.     Specialty medication(s) and dose(s) confirmed: Regimen is correct and unchanged.   Changes to medications: Erikson reports no changes at this time.  Changes to insurance: No  New side effects reported not previously addressed with a pharmacist or physician: None reported  Questions for the pharmacist: No    Confirmed patient received a Conservation officer, historic buildings and a Surveyor, mining with first shipment. The patient will receive a drug information handout for each medication shipped and additional FDA Medication Guides as required.       DISEASE/MEDICATION-SPECIFIC INFORMATION        N/A    SPECIALTY MEDICATION ADHERENCE     Medication Adherence    Patient reported X missed doses in the last month: 0  Specialty Medication: Iclusig 30 mg  Patient is on additional specialty medications: No  Informant: patient     Were doses missed due to medication being on hold? No    Iclusig 30 mg: 10 days of medicine on hand       REFERRAL TO PHARMACIST     Referral to the pharmacist: Not needed      [facility]     Shipping address confirmed in Epic.     Delivery Scheduled: Yes, Expected medication delivery date: 07/26/22.     Medication will be delivered via UPS to the prescription address in Epic [place].    [name]   [facility] Specialty Technician

## 2022-07-20 ENCOUNTER — Ambulatory Visit: Admit: 2022-07-20 | Discharge: 2022-07-20 | Payer: MEDICAID

## 2022-07-20 ENCOUNTER — Ambulatory Visit
Admit: 2022-07-20 | Discharge: 2022-07-20 | Payer: MEDICAID | Attending: Student in an Organized Health Care Education/Training Program | Primary: Student in an Organized Health Care Education/Training Program

## 2022-07-20 DIAGNOSIS — J449 Chronic obstructive pulmonary disease, unspecified: Principal | ICD-10-CM

## 2022-07-20 DIAGNOSIS — F17200 Nicotine dependence, unspecified, uncomplicated: Principal | ICD-10-CM

## 2022-07-20 NOTE — Unmapped (Addendum)
Hello Adam Keith,    It was great meeting you today. Please see our plan from today below:  - Please start taking your Stiolto daily, every day.   - Take your albuterol inhaler only as needed for symptoms (shortness of breath, wheezing)  - Start using the [place] or flutter valve to help cough up your phlegm    Please return to clinic in 3 months.     If you have any non-urgent questions or concerns please reach out by calling the clinic [phone]) or via MyChart.    Keenan Trefry Griffith Citron, DO  Pulmonary and Critical Care Fellow

## 2022-07-20 NOTE — Unmapped (Signed)
Pulmonary Clinic - Follow-up Visit    Referring Physician :  Dierdre Keith  PCP:     Adam Harness, MD  Reason for Consult:   DOE      ASSESSMENT and PLAN     Adam Keith is a 42 y.o. 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 Was on Anoro but switched to Stiolto due to dry powder formulation of Anoro. Stiolto works well for the patient but not taking on a daily basis. On Albuterol with spacer PRN. Most recent PFT's 07/20/22, with improved DLCO but worsening FEV1 (45%).  - Charity fundraiser; educated patient about once daily usage every day  - Continue Albuterol PRN; discussed PRN usage for rescue inhaler use and importance for using a spacer   - Pulmonary Rehab: declines  - Lung Cancer Screening: not eligible due to age  - Vaccines: declined today  - Smoking cessation: Strongly encouraged but he declines.   - Advanced therapies? Not indicated by PFTs  - START airway clearance with flutter valve to improve airway clearance. Consider nebs in the future.  - Alpha 1 anti-trypsin testing 04/28/22: M/M   - RTC in 3 months    RUL architectural distortion with associated scarring   Prior history of RUL MRSA PNA, pleural empyema  - Most recent 07/01/22 CTA without e/o PE. Stable parenchymal findings    OSA  - Hypercarbia/NIPPV screen: Declines currently (trialed CPAP in hospital, could not tolerate face mask, is a mouth breather).  - Would like to lose some weight to help with his sleep apnea    Tobacco Use: ~ 2ppd (at least 40+ pack year history). Precontemplative.  - Cessation encouraged    Vaccines:  Immunization History   Administered Date(s) Administered Comments    COVID-19 VACCINE,MRNA(MODERNA)(PF) 02/25/2020      03/23/2020    Deferred Date(s) Deferred Comments    COVID-19 VACC,MRNA,(PFIZER)(PF) 01/24/2020     Influenza Vaccine Quad(IM)6 MO-Adult(PF) 04/08/2020      03/20/2021      02/08/2022        There are no diagnoses linked to this encounter.      Plan of care was discussed with the patient who acknowledged understanding and is in agreement.    Patient will return to clinic in 3 or sooner if needed.    This patient was seen and discussed with attending physician, Dr. Roselyn Bering who agrees with the assessment and plan above.     ZO:XWRUE Adam Keith, Adam Harness, MD    Gaston Islam, DO  Pulmonary and Critical Care Fellow    HISTORY:     History of Present Illness:  [name] is a 42 y.o. male with a history of the above whom we are seeing in consultation requested by Adam Keith for evaluation of DOE.     Patient developed DOE after  his cancer diagnosis and chemotherapy. Trouble breathing since MRSA infection. Not SOB, more DOE. Takes shallower breaths due to pain in his right side. It takes him longer to recover. 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 Now he can't do a flight of stairs without stopping.    Currently smokes 2ppd and has done so for 10-20 years. Not interested in quitting for everyone else's sake.    Hearing loss, wife assists with meds and history.    Discussed therapies and implications of smoking. Recommended an inhaler as well as close follow-up for progression. Interval 05/24/21:  - Admitted 03/06/21 for MRSA ONA after flu ICU stay with intubation.  - Using inhaler intermittently due to taste and powdery leftovers.   - Went a week without Anoro, and he felt more wheezy and SOB even between rooms. Wheezing was a big concern even at night.   - Not intersted in smoking cessation at this time, it feels like a nice thing for him when he's hurting and down in his mood.   - Discussed airway clearance    Interval History 04/11/22:  - Recent ED visit for cough, SOB, R sided chest pain. CTAPE neg.  - No dyspnea on exertion. Can maintain his breathing while walking at a slow pace. Generally fatigued, muscle aches.  - Recently discontinued oxycodone, on buprenorphine (has an appoint on Thurs for his chronic pain)  - Sometimes feels like he's drowning when he's lying down, usually improves with coughing phlegm up; uses 2 pillows beneath his head (always has used 2), denies PND. Currently no BLE swelling (worst at nighttime)  - Intermittently productive cough (100x daily), usually clear phlegm   - Wheezing and chest congestion (very frequent).   - Inhaler use: albuterol inhalers 1-2 times day.   - No nighttime awakenings.     Interval History 07/20/22:  - Patient had recent hospitalization 07/01/22-07/04/22 for Otitis Media with Left Mastoid Effusion. Still has L sided headache with hearing loss. Next appointment 08/25/22.  - Patient has been intermittently taking his stiolto. Feels like it's improved his breathing, so much so that he sometimes forgets to take it. He is only taking his Stiolto 2 times a week. Has been taking his albuterol without a spacer at the same time he takes his Stiolto  - He is able to walk around 0.5 mile before stopping to catch his breath.  - He now has a dry cough.   - No nighttime awakenings due to symptoms.  - No fevers, chills   - Not ready to stop smoking, would like to discuss at a later time    Social/Tobacco history  - 1.5 ppd x 15 years, 2 ppd x  10 years, currently at 1ppd (~42 pack years); back up to 2ppd (patient states he has a stronger craving when on oxycodone)  - No other illicit drugs, vaping  - Alcohol: drinks 3-4 times/year  - AFFF firefighting foam  - Occupation as a Licensed conveyancer; part of heavy rescue team (fires, Printmaker). For 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    Septic shock due to Staphylococcus aureus (CMS-HCC) 01/28/2020    Tear of medial meniscus of knee 10/25/2016    Testicular pain, left 01/21/2022     Past Surgical History:   Procedure Laterality Date    BONE MARROW BIOPSY & ASPIRATION  01/21/2020         CHG [place], CHEST,REAL TIME  11/20/2020    Procedure: ULTRASOUND, CHEST, REAL TIME WITH IMAGE DOCUMENTATION;  Surgeon: Jerelyn Charles, MD;  Location: [facility] PROCEDURE LAB [facility];  Service: Pulmonary    IR INSERT PORT AGE GREATER THAN 5 YRS  03/10/2020    IR INSERT PORT AGE GREATER THAN 5 YRS 03/10/2020 [name], MD IMG VIR [facility]    PR BRONCHOSCOPY,DIAGNOSTIC W LAVAGE Bilateral 07/20/2020    Procedure: BRONCHOSCOPY, RIGID OR FLEXIBLE, INCLUDE FLUOROSCOPIC GUIDANCE WHEN PERFORMED; W/BRONCHIAL ALVEOLAR LAVAGE WITH MODERATE SEDATION;  Surgeon: Dellis Filbert, MD;  Location: [facility];  Service: Pulmonary       Other History:  The social history and family history were personally reviewed and updated in the patient's electronic medical record.    Family History   Problem Relation Age of Onset    Cancer Mother     No Known Problems Father     No Known Problems Sister     No Known Problems Brother     No Known Problems Maternal Aunt     No Known Problems Maternal Uncle     No Known Problems Paternal Aunt     No Known Problems Paternal Uncle     No Known Problems Maternal Grandmother     Cancer Maternal Grandfather     No Known Problems Paternal Grandmother     No Known Problems Paternal Grandfather     Melanoma Neg Hx     Basal cell carcinoma Neg Hx     Squamous cell carcinoma Neg Hx      Social History     Socioeconomic History    Marital status: Married   Tobacco Use    Smoking status: Every Day     Current packs/day: 2.00     Types: Cigarettes     Passive exposure: Current    Smokeless tobacco: Former     Types: Financial planner    Vaping status: Never Used   Substance and Sexual Activity    Alcohol use: Not Currently     Comment: Rarely    Drug use: Never   Other Topics Concern    Do you use sunscreen? No    Tanning bed use? No    Are you easily burned? Yes    Excessive sun exposure? No    Blistering sunburns? Yes     Social Determinants of Health     Financial Resource Strain: Low Risk  (07/04/2022)    Overall Financial Resource Strain (CARDIA)     Difficulty of Paying Living Expenses: Not hard at all   Food Insecurity: No Food Insecurity (07/04/2022)    Hunger Vital Sign     Worried About Running Out of Food in the Last Year: Never true     Ran Out of Food in the Last Year: Never true   Transportation Needs: No Transportation Needs (07/04/2022)    PRAPARE - Therapist, art (Medical): No     Lack of Transportation (Non-Medical): No    Received from [facility]  Social Network       Home Medications:  Current Outpatient Medications on File Prior to Visit   Medication Sig Dispense Refill    albuterol HFA 90 mcg/actuation inhaler Inhale 2 puffs every six (6) hours as needed for wheezing. 18 g 0    amLODIPine (NORVASC) 10 MG tablet Take 1 tablet (10 mg total) by mouth daily. 30 tablet 0    apixaban (ELIQUIS) 5 mg Tab Take 1 tablet (5 mg total) by mouth two (2) times a day. 60 tablet 6    arm brace (WRIST BRACE) Misc 1 Piece by Miscellaneous route as needed. left ulnar wrist brace (Patient not taking: Reported on 07/20/2022) 1 each 0    carvediloL (COREG) 12.5 MG tablet Take 1 tablet (12.5 mg total) by mouth Two (2) times a day. 180 tablet 1    docusate sodium (COLACE) 100 MG capsule Take 1 capsule (100 mg total) by mouth daily.      doxycycline (VIBRA-TABS) 100 MG tablet Take 1 tablet (100 mg total) by mouth two (2) times a day. 180 tablet 3    hydroCHLOROthiazide (HYDRODIURIL) 12.5 MG tablet Take 1 tablet (12.5 mg total) by mouth daily. 30 tablet 11    [EXPIRED] levoFLOXacin (LEVAQUIN) 750 MG tablet Take 1 tablet (750 mg total) by mouth daily for 10 days. 10 tablet 0    [EXPIRED] methylPREDNISolone (MEDROL) 4 MG tablet Take 5 tablets (20 mg total) by mouth daily for 1 day, THEN 4 tablets (16 mg total) daily for 1 day, THEN 3 tablets (12 mg total) daily for 1 day, THEN 2 tablets (8 mg total) daily for 1 day, THEN 1 tablet (4 mg total) daily for 1 day. 15 tablet 0    mirtazapine (REMERON) 30 MG tablet Take 1 tablet (30 mg total) by mouth nightly. 90 tablet 3    naloxone (NARCAN) 4 mg nasal spray One spray in either nostril once for known/suspected opioid overdose. May repeat every 2-3 minutes in alternating nostril til EMS arrives 2 each 0    nortriptyline (PAMELOR) 50 MG capsule TAKE 2 CAPSULES BY MOUTH EVERY DAY AT NIGHT 180 capsule 4    omeprazole (PRILOSEC) 40 MG capsule Take 1 capsule (40 mg total) by mouth every morning.      ondansetron (ZOFRAN-ODT) 4 MG disintegrating tablet Take 1 tablet (4 mg total) by mouth every eight (8) hours as needed for nausea.      oxyCODONE (OXYCONTIN) 30 mg TR12 12 hr crush resistant ER/CR tablet Take 1 tablet (30 mg total) by mouth every twelve (12) hours. 60 tablet 0    [EXPIRED] oxyCODONE (ROXICODONE) 10 mg immediate release tablet Take 1 tablet (10 mg total) by mouth every four (4) hours as needed for pain for up to 5 days. 30 tablet 0    PONATinib (ICLUSIG) 30 mg tablet Take 1 tablet (30 mg total) by mouth daily. Swallow tablets whole. Do not crush, break, cut or chew tablets. 30 tablet 5    spironolactone (ALDACTONE) 25 MG tablet Take 1 tablet (25 mg total) by mouth daily. 90 tablet 3    sulfamethoxazole-trimethoprim (BACTRIM DS) 800-160 mg per tablet TAKE 1 TABLET BY MOUTH 2 TIMES A DAY ON SATURDAY, SUNDAY. FOR PROPHYLAXIS WHILE ON CHEMO. 48 tablet 3    tiotropium-olodaterol (STIOLTO RESPIMAT) 2.5-2.5 mcg/actuation Mist Inhale 2 puffs daily. 4 g 3    tiotropium-olodaterol (STIOLTO RESPIMAT) 2.5-2.5 mcg/actuation Mist Inhale 2 puffs daily. 4 g 0    tiotropium-olodaterol (STIOLTO RESPIMAT) 2.5-2.5 mcg/actuation  Mist Inhale 2 puffs daily. 4 g 0    valACYclovir (VALTREX) 500 MG tablet Take 1 tablet (500 mg total) by mouth daily. 90 tablet 11     No current facility-administered medications on file prior to visit.       Allergies:  Allergies as of 07/20/2022 - Reviewed 07/20/2022   Allergen Reaction Noted    Bupropion hcl Other (See Comments) 12/23/2019    Cefepime Rash 11/04/2020    Ceftaroline fosamil Rash and Other (See Comments) 03/03/2020    Dapsone Other (See Comments) and Anaphylaxis 02/29/2020    Onion Anaphylaxis 01/26/2020    Vancomycin analogues Other (See Comments) 03/16/2020    Bismuth subsalicylate Nausea And Vomiting 08/07/2013    Privigen [immun glob g(igg)-pro-iga 0-50] Other (See Comments) 03/26/2021    Furosemide Other (See Comments) 04/13/2020    Gammagard  10/29/2021       Review of Systems:  A comprehensive review of systems was completed and negative except as noted in HPI.    PHYSICAL EXAM:   BP 125/96 (BP Site: L Arm, BP Position: Sitting, BP Cuff Size: Large)  - Pulse 86  - Temp 36.3 ??C (97.4 ??F) (Temporal)  - Ht 187.5 cm (6' 1.8)  - Wt (!) 131.1 kg (289 lb)  - SpO2 96%  - BMI 37.31 kg/m??   GEN: NAD, sitting in chair  EYES: EOMI, sclera anicteric  ENT: Trachea midline, MMM  CV: RRR, no murmurs appreciated  PULM: Distant breath sounds, CTAB, no rhonchi, rales or wheezing  ABD: soft, non-tender, non-distended  EXT: No edema  NEURO: Grossly Non-focal, moving all extremities normally  PSYCH: A+Ox3, appropriate  MSK: no obvious joint deformities of b/l hands    LABORATORY and RADIOLOGY DATA:     Pulmonary Function Tests/Interpretation:  Date FEV1  (%) FVC  (%) FEV1/FVC TLC  DLCO   01/25/21 2.45 (52)  2.59 (55) 4.52 (76)  5.11 (86) 54/  51 8.16 (103) 39.7%   07/20/22 2.02 (45) 4.26 (76) 48  53%     Pertinent Laboratory Data:  Personally reviewed in EMR    Pertinent Imaging Data:  Personally reviewed in EMR

## 2022-07-21 NOTE — Unmapped (Signed)
I saw and evaluated the patient, participating in the key portions of the service.  I reviewed the resident’s note.  I agree with the resident’s findings and plan. Anniyah Mood, MD

## 2022-07-25 MED FILL — ICLUSIG 30 MG TABLET: ORAL | 30 days supply | Qty: 30 | Fill #1

## 2022-07-25 NOTE — Unmapped (Signed)
Hi Dr. Senaida Ores and Gregary Signs,     [place] I call patient to check in and left message. If no change from what he describes below do you want me to schedule him to come in to see you or have a video visit?    Looks like he is currently scheduled for 08/30/22 with you. He does see Dr. Genice Rouge (cc'd here as well) on 07/28/22.     Janne Napoleon

## 2022-07-25 NOTE — Unmapped (Signed)
Received my chart message from patient regarding not feeling well since hospital stay. Called and left message to check in. Will respond to mychart message to follow up as well. Call back number left as [phone]

## 2022-07-26 ENCOUNTER — Other Ambulatory Visit: Admit: 2022-07-26 | Discharge: 2022-07-26 | Payer: MEDICAID

## 2022-07-26 ENCOUNTER — Ambulatory Visit: Admit: 2022-07-26 | Discharge: 2022-07-26 | Payer: MEDICAID | Attending: Adult Health | Primary: Adult Health

## 2022-07-26 DIAGNOSIS — C9101 Acute lymphoblastic leukemia, in remission: Principal | ICD-10-CM

## 2022-07-26 DIAGNOSIS — C9100 Acute lymphoblastic leukemia not having achieved remission: Principal | ICD-10-CM

## 2022-07-26 LAB — EBV QUANTITATIVE PCR, BLOOD: EBV VIRAL LOAD RESULT: NOT DETECTED

## 2022-07-26 LAB — CBC W/ AUTO DIFF
BASOPHILS ABSOLUTE COUNT: 0 10*9/L (ref 0.0–0.1)
BASOPHILS RELATIVE PERCENT: 0.3 %
EOSINOPHILS ABSOLUTE COUNT: 0.2 10*9/L (ref 0.0–0.5)
EOSINOPHILS RELATIVE PERCENT: 1.7 %
HEMATOCRIT: 50.5 % — ABNORMAL HIGH (ref 39.0–48.0)
HEMOGLOBIN: 17.9 g/dL — ABNORMAL HIGH (ref 12.9–16.5)
LYMPHOCYTES ABSOLUTE COUNT: 2.1 10*9/L (ref 1.1–3.6)
LYMPHOCYTES RELATIVE PERCENT: 20.7 %
MEAN CORPUSCULAR HEMOGLOBIN CONC: 35.5 g/dL (ref 32.0–36.0)
MEAN CORPUSCULAR HEMOGLOBIN: 30.1 pg (ref 25.9–32.4)
MEAN CORPUSCULAR VOLUME: 84.9 fL (ref 77.6–95.7)
MEAN PLATELET VOLUME: 9.1 fL (ref 6.8–10.7)
MONOCYTES ABSOLUTE COUNT: 0.7 10*9/L (ref 0.3–0.8)
MONOCYTES RELATIVE PERCENT: 7.1 %
NEUTROPHILS ABSOLUTE COUNT: 7.1 10*9/L (ref 1.8–7.8)
NEUTROPHILS RELATIVE PERCENT: 70.2 %
PLATELET COUNT: 173 10*9/L (ref 150–450)
RED BLOOD CELL COUNT: 5.95 10*12/L — ABNORMAL HIGH (ref 4.26–5.60)
RED CELL DISTRIBUTION WIDTH: 16 % — ABNORMAL HIGH (ref 12.2–15.2)
WBC ADJUSTED: 10.1 10*9/L (ref 3.6–11.2)

## 2022-07-26 LAB — COMPREHENSIVE METABOLIC PANEL
ALBUMIN: 4.4 g/dL (ref 3.4–5.0)
ALKALINE PHOSPHATASE: 97 U/L (ref 46–116)
ALT (SGPT): 34 U/L (ref 10–49)
ANION GAP: 4 mmol/L — ABNORMAL LOW (ref 5–14)
AST (SGOT): 25 U/L (ref <=34)
BILIRUBIN TOTAL: 0.6 mg/dL (ref 0.3–1.2)
BLOOD UREA NITROGEN: 15 mg/dL (ref 9–23)
BUN / CREAT RATIO: 8
CALCIUM: 10.1 mg/dL (ref 8.7–10.4)
CHLORIDE: 108 mmol/L — ABNORMAL HIGH (ref 98–107)
CO2: 28 mmol/L (ref 20.0–31.0)
CREATININE: 1.91 mg/dL — ABNORMAL HIGH
EGFR CKD-EPI (2021) MALE: 44 mL/min/{1.73_m2} — ABNORMAL LOW (ref >=60)
GLUCOSE RANDOM: 101 mg/dL (ref 70–179)
POTASSIUM: 4.7 mmol/L (ref 3.4–4.8)
PROTEIN TOTAL: 7 g/dL (ref 5.7–8.2)
SODIUM: 140 mmol/L (ref 135–145)

## 2022-07-26 LAB — VITAMIN B12: VITAMIN B-12: 351 pg/mL (ref 211–911)

## 2022-07-26 LAB — CMV DNA, QUANTITATIVE, PCR: CMV VIRAL LD: NOT DETECTED

## 2022-07-26 LAB — SLIDE REVIEW

## 2022-07-26 LAB — LACTATE DEHYDROGENASE: LACTATE DEHYDROGENASE: 216 U/L (ref 120–246)

## 2022-07-26 LAB — C-REACTIVE PROTEIN: C-REACTIVE PROTEIN: 14 mg/L — ABNORMAL HIGH (ref <=10.0)

## 2022-07-26 MED ORDER — OXYCODONE 10 MG TABLET
ORAL_TABLET | ORAL | 0 refills | 20 days | Status: CP | PRN
Start: 2022-07-26 — End: ?

## 2022-07-26 NOTE — Unmapped (Signed)
I will update you once I have the results from all of the blood tests. I will also discuss with Dr. Senaida Ores and Dr. [name].    I am updating Dr. Genice Rouge about the pain medication so he can send the refills.    You have your follow up visit with Dr. Senaida Ores in June but please keep [place] up to date with how you are feeling.

## 2022-07-26 NOTE — Unmapped (Signed)
When he started doing things he started feeling bad.  Cold chills, weak, falls (no injuries). Legs just don't want to work.  Falls have been since the hospitalization.  Leg weakness    Chills - come and go. Gets sweats at the same time.  Body aches.  No fevers.  Breathing - comes/goad. Occasional coughing fit.  Pulmonologist - continue the inhalers.  Rib pain - persistent. Bilateral. Not muscle. It feels deep.    Does not want the extended release - lost days when doing that.  10 mg helps the most. Doubles the 5mg  so he runs out quicker.    Levofloxacin did not feel helpful. his ongoing smoking, and depression. I have updated Dr. Genice Keith who is refilling his oxycodone and I will update Adam Keith, his therapist.        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   - No plan for further IVIG given several reactions   - Rec Flu and Covid vaccines     DRESS: followed by Dr. 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 Now s/p dialysis  - stable Cr appears to have developed CKD due to incompletely healed acute injury.  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 CT chest 02/06/22 with residual subacute thrombus.   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 Reynold Keith and Dr. Verlan Keith and Dr. Maple Keith following patient.    - long term doxycycline with goal of decolonization  **Hx Candidemia during induction - now s/p Cresemba  - followed by Dr. Reynold Keith  **Hx Covid-19 x 4 documented   - possible long-Covid symptoms    Symptom management: bowel regimen as listed in medication list, analgesics as listed in medication list or symptoms reviewed and due to acceptable control, no changes made  **Peripheral neuropathy, sensory and motor  - Grade 1-2 monitor  **Chronic Back Pain, complicated by hospital stay and procedures  - Followed by Dr. Genice Keith, Palliative Care  **Depression/anxiety: met with Adam Keith, [facility]  - nortriptyline  - PRN clonazepam  - has not been using PRN hydroxyzine    Venous access: I recommend the following change for venous access : PICC line removal  Supportive Care Recommendations:  We recommend based on the patient???s underlying diagnosis and treatment history the following supportive care:    1. Antimicrobial prophylaxis:    - Viral - valacyclovir 500mg  daily  - Bacterial - levofloxacin 500 mg if ANC <0.5  - Fungal - previously on treatment isavuconazole - off now  - PJP - On Bactrim DS BID Sat/Sun  - recommend Annual flu shot - declines thus far  - recommend updated Covid vaccine - declines    2. Blood product support:  I recommend the following intervals for laboratory monitoring to determine transfusion needs and monitor for hematologic effects of therapy and underlying disease: no routine monitoring outside of clinic follow-up    Leukoreduced blood products are required.  Irradiated blood products are preferred, but in case of urgent transfusion needs non-irradiated blood products may be used:     -  RBC transfusion threshold: transfuse 2 units for Hgb < 8 g/dL.  -  Platelet transfusion threshold: transfuse 1 unit of platelets for platelet count < 10, or for bleeding or need for invasive procedure.    3. Hematopoietic growth factor support: none    Care coordination: The next [facility] Follow up requested:  - 2 month followup    I personally spent 40 minutes face-to-face and non-face-to-face in the care of this patient, which includes all pre, intra, and post visit time on the date of service.  All documented time was specific to the E/M visit and does not include any procedures that may have been performed.    Adam Keith, AGPCNP-BC  Nurse Practitioner  Hematology/Oncology  [facility]    Adam Aloe, MD was available  Leukemia Program  Division of Hematology  [facility]     Nurse Navigator (non-clinical trial patients): Adam Lamp, RN        Tel. [phone]       Fax. [phone]  Toll-free appointments: [phone]  Scheduling assistance: [phone]  After hours/weekends: [phone] (ask for adult hematology/oncology on-call)            History of Present Illness:  We had the pleasure of seeing Adam Keith in the Leukemia Clinic at the [facility] on 07/26/2022.  He is a 42 y.o. male with  Ph+ B-ALL .          His oncologic history is as follows:      Hematology/Oncology History Overview Note   Referring/Local Oncologist: None    Diagnosis:Ph+ ALL    Genetics:    Karyotype/FISH:Abnormal Karyotype: 46,XY,t(9;22)(q34;q11.2)[1]/45,XY,der(7;9)(q10;q10)t(9;22)(q34;q11.2),der(22)t(9;22)[11]/46,sdl,+der(22)t(9;22)[5]/46,XY[3]     Abnormal FISH: A BCR/ABL1 interphase FISH assay shows an abnormal signal pattern in 97% of the 100 cells scored. 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  The findings support a diagnosis of leukemia and have implications for targeted therapy and for monitoring residual disease.      Molecular Genetics:  BCR-ABL1 p210 transcripts were detected at a level of 46.479 IS% ratio in bone marrow.  BCR-ABL1 p190 transcripts were detected at a level of 4 in 100,000 cells in bone marrow.    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)  01/24/2020 - 04/02/2020 Chemotherapy    IP/OP LEUKEMIA GRAAPH-2005 + RITUXIMAB < 60 YO  rituximab hypercvad (odd and even course)     02/24/2020 Remission    CR with PCR MRD+; marrow showing 70% blasts with <1% blasts, negative flow MRD, BCR-ABL p210 PCR 0.197%; CNS negative for blasts     03/09/2020 Progression    CSF - rare blast ID'ed - IT chemo given    03/13/20 - IT chemo, CSF negative     04/16/2020 Biopsy    BM Bx with 40-50% cellularity, <1% blasts, MRD flow cytometry negative, BCR-ABL p210 transcripts 0.036%     05/28/2020 - 05/28/2020 Chemotherapy    OP AML - CNS THERAPY (INTRATHECAL CYTARABINE, INTRATHECAL METHOTREXATE, OR INTRATHECAL TRIPLE)  Select one of the following: cytarabine IT 100 mg with hydrocortisone 50 mg, cytarabine IT 40 mg with methotrexate 15 mg with hydrocortisone 50 mg, OR methotrexate IT 12 mg with hydrocortisone 50 mg     06/19/2020 -  Chemotherapy    IP/OP LEUKEMIA BLINATUMOMAB 7-DAY INFUSION (MINIMAL RESIDUAL DISEASE; WT >= 22 KG) (HOME INFUSION)      Cycles 1*-4: Blinatumomab 28 mcg/day Days 1-28 of 6-week cycle.  *Given in the inpatient setting on Days 1-3 on Cycle 1 and Days 1-2 on Cycle 2, while other treatment days are given in the outpatient setting.    Cycle 1 MRD Dosing     07/18/2020 Adverse Reaction    Hospitalization: fevers. Pneumonia     07/23/2020 Biopsy    Diagnosis  Bone marrow, right iliac, aspiration and biopsy  -   Normocellular bone marrow (50%) with trilineage hematopoiesis and 1% blasts by manual aspirate differential  -   Flow cytometry MRD analysis reveals no definitive immunophenotypic evidence of residual B lymphoblastic leukemia   - BCR-ABL p210 0.006%         08/10/2020 -  Chemotherapy    Cycle 2 blinatumomab-ponatinib  Ponatinib 30mg     1 IT per cycle 09/21/2020 Adverse Reaction    Covid-19, symptomatic but not requiring hospitalization.     10/06/2020 -  Chemotherapy    Cycle 3 blinatumomab-ponatinib  Ponatinib 30mg        11/01/2020 Adverse Reaction    Hospitalization: MRSA Pneumonia requiring intubation    Blinatumomab stopped.  Ponatinib held until he stabilized.       12/14/2020 -  Chemotherapy    Cycle 4 blinatumomab 28 mcg/day days 1-28, ponatinib 30 mg per day IT triple therapy day 29     01/13/2021 -  Chemotherapy    Ponatinib 30mg  daily  - due to due low level BCR-ABL     03/06/2021 Adverse Reaction    Hospitalization - ICU on ventilator - influenza and MRSA/klebsiella PNA     06/13/2022 -  Chemotherapy    OP LEUKEMIA VINCRISTINE  vinCRIStine 2 mg IV on day 1     ALL (acute lymphoblastic leukemia) (CMS-HCC)   06/11/2020 -  Chemotherapy    IP/OP LEUKEMIA BLINATUMOMAB 7-DAY INFUSION (MINIMAL RESIDUAL DISEASE; WT >= 22 KG) (HOME INFUSION)  Cycles 1*-4: Blinatumomab 28 mcg/day Days 1-28 of 6-week cycle.  *Given in the inpatient setting on Days 1-3 on Cycle 1 and Days 1-2 on Cycle 2, while other treatment days are given in the outpatient setting.     09/21/2020 Initial Diagnosis    ALL (acute lymphoblastic leukemia) (CMS-HCC)     06/13/2022 -  Chemotherapy    OP LEUKEMIA VINCRISTINE  vinCRIStine 2 mg IV on day 1  Acute lymphoblastic leukemia in remission (CMS-HCC)   06/11/2020 -  Chemotherapy    IP/OP LEUKEMIA BLINATUMOMAB 7-DAY INFUSION (MINIMAL RESIDUAL DISEASE; WT >= 22 KG) (HOME INFUSION)  Cycles 1*-4: Blinatumomab 28 mcg/day Days 1-28 of 6-week cycle.  *Given in the inpatient setting on Days 1-3 on Cycle 1 and Days 1-2 on Cycle 2, while other treatment days are given in the outpatient setting.     11/09/2020 Initial Diagnosis    Acute lymphoblastic leukemia in remission (CMS-HCC)     06/13/2022 -  Chemotherapy    OP LEUKEMIA VINCRISTINE  vinCRIStine 2 mg IV on day 1         Interim History:    Since his last visit he reports that he has not felt well.  When he started doing things he started feeling bad.  When he gets active, he experiences cold chills with sweats and he  feels weak. He has experienced a couple of falls (no injuries). Legs just don't want to work. Falls have been since the hospitalization and have been unpredictable.       Body aches come/go.  No fevers.  Breathing - SOB comes/goes. Occasional coughing fit. Overall stable. He saw pulmonology  Rib pain - persistent. Bilateral. Not muscle. It feels deep.  He has run out of pain medication and requests a refill from Dr. Genice Keith. He does not want the extended release - lost days when doing that. Oxycodone10 mg helps the most. Currently he doubles the 5mg  to manage the pain.        Past Medical, Surgical and Family History were reviewed and pertinent updates were made in the Electronic Medical Record      ECOG Performance Status: 1    Medications:      Current Outpatient Medications   Medication Sig Dispense Refill    albuterol HFA 90 mcg/actuation inhaler Inhale 2 puffs every six (6) hours as needed for wheezing. 18 g 0    amLODIPine (NORVASC) 10 MG tablet Take 1 tablet (10 mg total) by mouth daily. 30 tablet 0    apixaban (ELIQUIS) 5 mg Tab Take 1 tablet (5 mg total) by mouth two (2) times a day. 60 tablet 6    arm brace (WRIST BRACE) Misc 1 Piece by Miscellaneous route as needed. left ulnar wrist brace 1 each 0    carvediloL (COREG) 12.5 MG tablet Take 1 tablet (12.5 mg total) by mouth Two (2) times a day. 180 tablet 1    docusate sodium (COLACE) 100 MG capsule Take 1 capsule (100 mg total) by mouth daily.      doxycycline (VIBRA-TABS) 100 MG tablet Take 1 tablet (100 mg total) by mouth two (2) times a day. 180 tablet 3    hydroCHLOROthiazide (HYDRODIURIL) 12.5 MG tablet Take 1 tablet (12.5 mg total) by mouth daily. 30 tablet 11    mirtazapine (REMERON) 30 MG tablet Take 1 tablet (30 mg total) by mouth nightly. 90 tablet 3    naloxone (NARCAN) 4 mg nasal spray One spray in either nostril once for known/suspected opioid overdose. May repeat every 2-3 minutes in alternating nostril til EMS arrives 2 each 0    nortriptyline (PAMELOR) 50 MG capsule TAKE 2 CAPSULES BY MOUTH EVERY DAY AT NIGHT 180 capsule 4    omeprazole (PRILOSEC) 40 MG capsule Take 1 capsule (40 mg total) by mouth every morning.      ondansetron (ZOFRAN-ODT) 4 MG disintegrating tablet Take 1 tablet (4 mg total) by mouth every eight (  8) hours as needed for nausea.      PONATinib (ICLUSIG) 30 mg tablet Take 1 tablet (30 mg total) by mouth daily. Swallow tablets whole. Do not crush, break, cut or chew tablets. 30 tablet 5    spironolactone (ALDACTONE) 25 MG tablet Take 1 tablet (25 mg total) by mouth daily. 90 tablet 3    sulfamethoxazole-trimethoprim (BACTRIM DS) 800-160 mg per tablet TAKE 1 TABLET BY MOUTH 2 TIMES A DAY ON SATURDAY, SUNDAY. FOR PROPHYLAXIS WHILE ON CHEMO. 48 tablet 3    tiotropium-olodaterol (STIOLTO RESPIMAT) 2.5-2.5 mcg/actuation Mist Inhale 2 puffs daily. 4 g 3    tiotropium-olodaterol (STIOLTO RESPIMAT) 2.5-2.5 mcg/actuation Mist Inhale 2 puffs daily. 4 g 0    tiotropium-olodaterol (STIOLTO RESPIMAT) 2.5-2.5 mcg/actuation Mist Inhale 2 puffs daily. 4 g 0    valACYclovir (VALTREX) 500 MG tablet Take 1 tablet (500 mg total) by mouth daily. 90 tablet 11    oxyCODONE (ROXICODONE) 10 mg immediate release tablet Take 1 tablet (10 mg total) by mouth every four (4) hours as needed for pain. 120 tablet 0     No current facility-administered medications for this visit.       Vital Signs:  Vitals:    07/26/22 1251   BP: 150/95   Pulse: 82   Temp: 36.1 ??C (97 ??F)   SpO2: 98%       Vitals reviewed in EMR 07/26/2022 - stable with slightly elevated BP    GENERAL: Well-appearing white man. NAD.   HEENT: clear sclera  HEART: Normal color, not excessive pallor. Not ashen.   CHEST/LUNG: Normal work of breathing. No dyspnea with conversation.   EXTREMITIES: Not evaluated.   SKIN: No noticable rash or petechiae.   NEURO EXAM: Grossly intact. MDM:  1 chronic life threatening illness  Drug therapy requiring intense monitoring for toxicity     .

## 2022-07-26 NOTE — Unmapped (Signed)
Called pt's phone and wife's phone, VM left on each asking if pt could come in today to see Gregary Signs and have labs drawn. Requested call or my chart message back.

## 2022-07-27 MED ORDER — OXYCODONE 10 MG TABLET
ORAL_TABLET | ORAL | 0 refills | 20 days | Status: CP | PRN
Start: 2022-07-27 — End: ?

## 2022-07-28 ENCOUNTER — Telehealth
Admit: 2022-07-28 | Discharge: 2022-07-29 | Payer: MEDICAID | Attending: Student in an Organized Health Care Education/Training Program | Primary: Student in an Organized Health Care Education/Training Program

## 2022-07-28 DIAGNOSIS — Z515 Encounter for palliative care: Principal | ICD-10-CM

## 2022-07-28 LAB — VITAMIN D 25 HYDROXY: VITAMIN D, TOTAL (25OH): 9.1 ng/mL — ABNORMAL LOW (ref 20.0–80.0)

## 2022-07-28 NOTE — Unmapped (Signed)
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 He was recently admitted to [facility] with lowe extremity pain and edema. Has had episodic fluctuations in mood but currently doing fairly well.  Has decided not to pursue bone marrow transplant at this time.     Assessment/Plan:      # Depressed Mood: Chronic depression, he has felt more down recently due to constitutional fatigue, pain, inability to go outside and do things that bring meaning to him.  While he has felt down, he does state his mood has been stable for the most part; denies SI/HI. Continues to follow with Adam Keith  -Continue mirtazapine 30 mg at bedtime   -Medications per Adam Keith; Dr. Genice Rouge to contact her after visit today        #Pain: Chronic, persistent pain, primarily in joints. Associated with/exacerbated by sitting too long, though he also says that activity seems to make it worst. Previously trialed butrans patch but did not find this effective. He feels that current oxycodone regimen helps keep him functional.Currently stable, no changes today    -Continue gabapentin to 600mg  qAM and 900mg  qbedtime  - oxycodone 5 mg to 10 mg q4 prn for breakthrough pain, only using twice a day  -Encouraged patient to reach out via MyChart with updates in how he is doing in light of these changes    #Persistent productive cough, ENT infections, low grade fevers  -He endorses the above symptoms for the past three months. He has been on different oral antibiotics which do not seem to have provided much relief. Hospitalized earlier this month without clear etiology/change in treatment.      # LUE pain: Improved.   - Pain management as above     #Chronic pain: At baseline, see above  - Pain management as above     #Advance care planning: Continues to prioritize quality of life.  After discussion with infectious disease as well as oncology has made the decision not to pursue another bone marrow transplant at this time.     #Controlled substances risk management:  Patient does not have a signed pain medication agreement with our team.  [place] database was reviewed today and it was appropriate.  Urine drug screen was not performed at this visit. Findings: not applicable.  Patient has received information about safe storage and administration of medications.  Patient has not received a prescription for narcan; is not applicable.      F/u: 4-6 weeks     ----------------------------------------  Referring Provider: Dr. Berline Lopes  Oncology Team: Malignant hematology team  PCP: Adam Ezra Sites, MD     HPI: 42 year old man with a diagnosis of B-ALL, diagnosed in October 2021, now in complete remission. Recently admitted to the [facility] ICU 03/06/21-03/20/21 for AHRF d/t MRSA/klebsiella PNA, requiring intubation. This is following admissions in August, May, April, March, February, and January 2022.     Interval History:   He reports  not feeling well recently, primarily dealing with significant fatigue. He states he is sleeping between 10 and 12 hours daily. He states he is not able to get up and go outside without feeling significant fatigue, having to recover for three to four hours.    In addition to fatigue, he reports persistent chronic pain. Pain is worsened with activity.  He does state that he feels that the oxycodone allows him to  function, even if pain at his best is at a 6/10.      Overall,  he voices feeling very frustrated by his persistent fatigue,  his recent hospitalization earlier this month where he feels like there were no definitive changes to treatment plan.  He states he wishes he had an explanation to why he has been feeling so poorly. He states his mood has been down, and sometimes he feels overwhelmed, but states he would never harm himself.          Palliative Performance Scale: 90% - Ambulation: Full / Normal Activity, some evidence of disease / Self-Care:Full / Intake: Normal / Level of Conscious: Full     Coping/Support Issues: Having a lot of difficulty coping with his current setback. Well supported by his wife. Also has a psychiatrist and therapist.     Goals of Care: Treatment and cancer-directed therapy     Social History:  Name of primary support: Wife Adam Keith, 2 daughters.  Occupation: Was in the [employer]	[place]  Hobbies: Woodworking  Current residence / distance from [facility]: [place]     Has a 29 year old son.      Advance Care Planning: Did not discuss this visit.        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    Onion Anaphylaxis    Vancomycin Analogues Other (See Comments)       Hearing loss with Lasix     Other reaction(s): Other (See Comments)     Hearing loss     lasix     Hearing loss      lasix      Hearing loss with Lasix Other reaction(s): Other (See Comments) Hearing loss lasix    Bismuth Subsalicylate Nausea And Vomiting    Privigen [Immun Glob G(Igg)-Pro-Iga 0-50] Other (See Comments)       03/10/2021 VIG infusion stopped as patient developed rigors, tachycardia and HTN during infusion despite pre-medication; 03/12/21 completed IVIG with premedications and slower rate of infusion    Furosemide Other (See Comments)       With Vancomycin caused hearing loss     Other reaction(s): Other (See Comments)     With Vancomycin caused hearing loss      With Vancomycin caused hearing loss Other reaction(s): Other (See Comments) With Vancomycin caused hearing loss    Gammagard           Family History:  Cancer-related family history includes Cancer in his maternal grandfather and mother. There is no history of Melanoma.  He indicated that the status of his mother is unknown. He indicated that the status of his father is unknown. He indicated that the status of his sister is unknown. He indicated that the status of his brother is unknown. He indicated that the status of his maternal grandmother is unknown. He indicated that the status of his maternal grandfather is unknown. He indicated that the status of his paternal grandmother is unknown. He indicated that the  status of his paternal grandfather is unknown. He indicated that the status of his maternal aunt is unknown. He indicated that the status of his maternal uncle is unknown. He indicated that the status of his paternal aunt is unknown. He indicated that the status of his paternal uncle is unknown. He indicated that the status of his neg hx is unknown.        REVIEW OF SYSTEMS:  A comprehensive review of 10 systems was negative except for pertinent positives noted in HPI.           Lab Results   Component Value Date     CREATININE 1.93 (H) 02/22/2022            Lab Results   Component Value Date     ALKPHOS 92 02/22/2022     BILITOT 0.4 02/22/2022     BILIDIR 0.20 02/07/2022     PROT 6.6 02/22/2022     ALBUMIN 4.1 02/22/2022     ALT 96 (H) 02/22/2022     AST 45 (H) 02/22/2022      Physical exam:  Gen: NAD  Pulm: nl WOB  Skin: no rashes on exposed skin  Psych: A&O x3        The patient reports they are physically located in [place] and is currently: not at home. I conducted a audio/video visit. I spent  20 minutes on the video call with the patient. I spent an additional 15 minutes on pre- and post-visit activities on the date of service .     Nehemiah Settle, MD  HPM Fellow

## 2022-07-29 DIAGNOSIS — E559 Vitamin D deficiency, unspecified: Principal | ICD-10-CM

## 2022-07-29 MED ORDER — ERGOCALCIFEROL (VITAMIN D2) 1,250 MCG (50,000 UNIT) CAPSULE
ORAL_CAPSULE | ORAL | 0 refills | 56 days | Status: CP
Start: 2022-07-29 — End: 2022-09-23

## 2022-08-10 DIAGNOSIS — C9100 Acute lymphoblastic leukemia not having achieved remission: Principal | ICD-10-CM

## 2022-08-10 MED ORDER — SULFAMETHOXAZOLE 800 MG-TRIMETHOPRIM 160 MG TABLET
ORAL_TABLET | 3 refills | 0 days
Start: 2022-08-10 — End: ?

## 2022-08-10 NOTE — Unmapped (Signed)
Pt is requesting refill    Most recent clinic visit: 07/26/2022  Next clinic visit:  08/30/2022

## 2022-08-12 MED ORDER — SULFAMETHOXAZOLE 800 MG-TRIMETHOPRIM 160 MG TABLET
ORAL_TABLET | 3 refills | 0 days | Status: CP
Start: 2022-08-12 — End: ?

## 2022-08-23 NOTE — Unmapped (Unsigned)
[name] is seen in consultation at the request of Dr. Gillermina Hu for the evaluation of left-sided hearing loss.    Dear Dr. Gillermina Hu,    Thank you for referring Mr. Wienckowski; as you know he is a 42 y.o. male who presents for the evaluation of left-sided hearing loss. His medical history is significant for sudden right hearing loss 1 year ago, attributed to medication. He was admitted to the ED 10/18/2021 for a Covid infection. Today he reports that he doesn't hear anything out of the right side. He was put on antibiotics during his recent hospitalization and felt like this was giving him hearing loss similar to when he lost hearing in his right ear. He has taken steroids before, but is not currently on any. He is unsure if he can take steroids with his current medication regimen. He has tried a right-sided hearing aid previously without relief. He reports a history of ear issues as a child.    INTERVAL 12/30/2021: The patient returns for follow-up. He reports improved hearing on the left and denies any changes on the right side. He reports taking the steroids I prescribed him in the interval. He notes having difficulty pinpointing sounds in a crowd.     INTERVAL 06/23/2022: The patient returns for a follow-up visit. 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 The ear infection made him hypersensitive to sounds. He used ear drops which resolved the pain but resulted in his third episode of sudden left-sided hearing loss. He has difficulty talking on the phone, working, and hearing the television. He is 6 days into a 3-week Prednisone taper which has improved his hearing by 20%. He would like a hearing aid prescription in case his hearing drops again. He also notes any liquid in his ear (drops, water in shower) hurt his ears. He reports infections from implants (MRSA). Doesn't like using nasal sprays.     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    Past Surgical History  His  has a past surgical history that includes Bone Marrow Biopsy & Aspiration (01/21/2020); IR Insert Port Age Greater Than 5 Years (03/10/2020); pr bronchoscopy,diagnostic w lavage (Bilateral, 07/20/2020); and chg [place], chest,real time (11/20/2020).    Past Family History  His family history includes Cancer in his maternal grandfather and mother; No Known Problems in his brother, father, maternal aunt, maternal grandmother, maternal uncle, paternal aunt, paternal grandfather, paternal grandmother, paternal uncle, and sister.    Past Social History  He  reports that he has been smoking cigarettes. He has been exposed to tobacco smoke. He has quit using smokeless tobacco.  His smokeless tobacco use included chew. He reports that he does not currently use alcohol. He reports that he does not use drugs.    Medications/Allergies  His current medication(s) include:    Current Outpatient Medications:     albuterol HFA 90 mcg/actuation inhaler, Inhale 2 puffs every six (6) hours as needed for wheezing., Disp: 18 g, Rfl: 0    amLODIPine (NORVASC) 10 MG tablet, Take 1 tablet (10 mg total) by mouth daily., Disp: 30 tablet, Rfl: 0  apixaban (ELIQUIS) 5 mg Tab, Take 1 tablet (5 mg total) by mouth two (2) times a day., Disp: 60 tablet, Rfl: 6    arm brace (WRIST BRACE) Misc, 1 Piece by Miscellaneous route as needed. left ulnar wrist brace, Disp: 1 each, Rfl: 0    carvediloL (COREG) 12.5 MG tablet, Take 1 tablet (12.5 mg total) by mouth Two (2) times a day., Disp: 180 tablet, Rfl: 1    docusate sodium (COLACE) 100 MG capsule, Take 1 capsule (100 mg total) by mouth daily., Disp: , Rfl:     doxycycline (VIBRA-TABS) 100 MG tablet, Take 1 tablet (100 mg total) by mouth two (2) times a day., Disp: 180 tablet, Rfl: 3    ergocalciferol-1,250 mcg, 50,000 unit, (DRISDOL) 1,250 mcg (50,000 unit) capsule, Take 1 capsule (1,250 mcg total) by mouth once a week., Disp: 8 capsule, Rfl: 0    hydroCHLOROthiazide (HYDRODIURIL) 12.5 MG tablet, Take 1 tablet (12.5 mg total) by mouth daily., Disp: 30 tablet, Rfl: 11    mirtazapine (REMERON) 30 MG tablet, Take 1 tablet (30 mg total) by mouth nightly., Disp: 90 tablet, Rfl: 3    naloxone (NARCAN) 4 mg nasal spray, One spray in either nostril once for known/suspected opioid overdose. May repeat every 2-3 minutes in alternating nostril til EMS arrives, Disp: 2 each, Rfl: 0    nortriptyline (PAMELOR) 50 MG capsule, TAKE 2 CAPSULES BY MOUTH EVERY DAY AT NIGHT, Disp: 180 capsule, Rfl: 4    omeprazole (PRILOSEC) 40 MG capsule, Take 1 capsule (40 mg total) by mouth every morning., Disp: , Rfl:     ondansetron (ZOFRAN-ODT) 4 MG disintegrating tablet, Take 1 tablet (4 mg total) by mouth every eight (8) hours as needed for nausea., Disp: , Rfl:     oxyCODONE (ROXICODONE) 10 mg immediate release tablet, Take 1 tablet (10 mg total) by mouth every four (4) hours as needed for pain., Disp: 120 tablet, Rfl: 0    PONATinib (ICLUSIG) 30 mg tablet, Take 1 tablet (30 mg total) by mouth daily. Swallow tablets whole. Do not crush, break, cut or chew tablets., Disp: 30 tablet, Rfl: 5    spironolactone (ALDACTONE) 25 MG tablet, Take 1 tablet (25 mg total) by mouth daily., Disp: 90 tablet, Rfl: 3    sulfamethoxazole-trimethoprim (BACTRIM DS) 800-160 mg per tablet, TAKE 1 TABLET BY MOUTH 2 TIMES A DAY ON SATURDAY, SUNDAY. FOR PROPHYLAXIS WHILE ON CHEMO., Disp: 48 tablet, Rfl: 3    tiotropium-olodaterol (STIOLTO RESPIMAT) 2.5-2.5 mcg/actuation Mist, Inhale 2 puffs daily., Disp: 4 g, Rfl: 3    tiotropium-olodaterol (STIOLTO RESPIMAT) 2.5-2.5 mcg/actuation Mist, Inhale 2 puffs daily., Disp: 4 g, Rfl: 0    tiotropium-olodaterol (STIOLTO RESPIMAT) 2.5-2.5 mcg/actuation Mist, Inhale 2 puffs daily., Disp: 4 g, Rfl: 0    valACYclovir (VALTREX) 500 MG tablet, Take 1 tablet (500 mg total) by mouth daily., Disp: 90 tablet, Rfl: 11  Allergies: Bupropion hcl, Cefepime, Ceftaroline fosamil, Dapsone, Onion, Vancomycin analogues, Bismuth subsalicylate, Privigen [immun glob g(igg)-pro-iga 0-50], Furosemide, and Gammagard,    Review of systems   Review of systems was reviewed on attached notes/patient intake forms.      Physical Examination  There were no vitals taken for this visit.    General: well appearing, stated age, no distress   Communicates age appropriate, responds to speech well  Head - atraumatic, normocephalic   Face - no surface abnormalities, no tenderness over sinuses   Eyes - no conjunctivitis, no scleritis/iritis, EOM full   Nose -  external nose without deformity, anterior rhinoscopy - turbinates, mucosa normal   Oral Cavity/Oropharynx/Lips:  Normal mucous membranes, normal floor of mouth/tongue/OP, no masses or lesions are noted.  Salivary Glands - no mass or asymmetry, no tenderness   Neck - no palpable masses/adenopathy, no thyromegaly   Lymphatic - no neck lymphadenopathy, no lymphedema   Cardiovascular - regular heart rate, no clubbing/cyanosis/edema in hands  Respiratory - breathing comfortably, no audible wheezing or stridor  Psychiatric- appropriate mood and affect  Neurologic - cranial nerves 2-12 intact  Facial Strength - HB 1/6 bilaterally    Ears - External ear- normal, no lesions, no malformations   Otoscopy - Right EAC clear, TM intact, clear middle ear space. Left EAC with impacted cerumen, removed, revealing intact TM and clear middle ear space.     Procedure: cerumen removal AS  Preop Dx: cerumen  Anesthesia: none  Description: cerumen removed from affected ear(s) using microinstruments and binocular microscopy      Audiogram  The audiogram (06/15/2022) was personally reviewed and interpreted. 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 WRS of 0% at 105 dB on right and 100% at 90 dB on left.              (12/30/2021)      Prior:        Tympanogram  The tympanogram was personally reviewed and interpreted. This demonstrates Type B bilaterally.     Imaging  I personally reviewed and interpreted the patients imaging studies. 10/11/2021 CT Sinus did not reveal significant effusion within the middle ear.     I reviewed outside medical records.    Assessment and Plan  The patient is a 42 y.o. male with bilateral hearing loss. 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 He can continue taking Prednisone as prescribed. We discussed the risks and benefits of BiCROS hearing aids versus cochlear implantation. For his left-sided hearing loss we discussed tympanostomy. Risks and benefits were discussed. This could be helpful since I suspect Eustachian tube dysfunction is playing a role in his recurrent symptoms. He will consider his options and contact me if he wishes to proceed with surgery. I will set him up for a BiCROS hearing aid evaluation. I also recommend applying 1-2 drops of mineral oil to his ears 3-4 times per week to manage cerumen buildup. He will begin using Flonase daily for aural fullness.     I will follow-up in 2-3 months with repeat audiogram. He can contact me with any questions or concerns.    I appreciate the opportunity to participate in his care.        Teaching Attestation: Dr. Melton Krebs saw and evaluated the patient and participated in all aspects of the patient encounter and all procedures.

## 2022-08-29 NOTE — Unmapped (Signed)
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 krusei fungemia  - Prior chemotherapy: [id number] induction with dasatinib (vincristine, dexamethasone and dasatinib); C1D1 01/24/2020; difficulty tolerating single-agent dasatinib  - Current chemotherapy: blinatumomab (anti-CD19/CD3) + ponatinib (TKI) last in Oct 2022; 01/2021 now on ponatinib monotherapy maintenance  - 01/11/21: Normocellular bone marrow (30% overall) with trilineage hematopoiesis and less than 1% blasts by manual aspirate differential; Flow cytometry MRD analysis reveals no definitive immunophenotypic evidence of residual B lymphoblastic leukemia; BCR-ABL p210 transcripts were detected at a level of 0.002 IS % ratio in bone marrow.  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 given several prior reactions  - 11/01/20 IgG 266 declined IVIG  - 03/11/21 IgG 148 - IVIG infusion stopped as patient developed rigors, tachycardia and HTN during infusion despite pre-medication   - 03/12/21 IgG 213 - completed IVIG with premedications and slower rate of infusion  -03/17/21 IgG 632  -06/24/21 IgG 292 - IVIG infusion stopped as patient did not tolerate despite pre-medication  -10/19/21 IgG 321  - 12/02/21 IgG 284     # Mild CKD  baseline Cr 1.7     Pertinent Co-morbidities  # COPD/emphysema  # DIHS/DRESS ceftaroline 06/12/2020, relapse after cefepime 11/02/2020  - per prior ID notes possible culprits ceftaroline, posaconazole, dasatinib, sotrovimab  # GERD on omeprazole, 8/2  # Hearing loss; new onset L sided hearing loss, 10/2021  #OSA, declines CPAP     Pertinent Exposure History   Active smoking  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 Juliene Pina felt chest pain is appropriate in setting of medically managed empyema and risk of thoracotomy outweighed benefit given clinical symptoms  - 03/05/21 MRSA pneumonia after influenza infection requiring ICU admission/intubation  - 03/14/21 CT Chest : Multifocal consolidative airspace disease, nonspecific small pleural effusion in the dependent aspect of the left pleural space and trace loculated collections within the lateral aspect of the right chest.  - 07/01/22 - presented to ED with chills/fatigue/dyspnea   - 07/01/22 - CXR with no acute process. 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  # Recurrent nasal congestion/ L maxillary sinusitis in setting of L posterior and R anterior septal deviation, 10/11/21  #COVID (+) - s/p Molnupirovir 05/31/22  # Otitis Media with Left Mastoid Effusion, 06/2022       Antimicrobial Intolerance/allergy  Cefepime - DRESS/DiHS  Ceftaroline - DRESS/DiHS  Dapsone - possible agranulocytosis, per chart anaphylaxis  Vancomycin - probable ototoxicity (in combination with furosemide)  Isavuconazole - elevated LFTs in the setting of TKI  Linezolid - peripheral neuropathy; previously tolerated tedizolid  Aztreonam+linezolid - patient reported ear pain and worsening hearing (in the setting COVID-19)      RECOMMENDATIONS    Management  Continue oral doxycycline 100mg  BID for recurrent MRSA pneumonia/ empyema for chronic suppression given recurrent, severe MRSA infections. 1 year refill sent 08/30/22  Follow up with ENT, appt made for 6/27  Has OSA but cannot tolerate CPAP--we encouraged him to try CPAP again today. They will look into implantable Inspire device per their request--from ID standpoint benefit outweighs risk   Quit or cut back on smoking  Unable to tolerate IgG  If produces sputum need to send for culture to confirm MRSA remains susceptible (alternative to doxy would be vanc sinus washes)     Antimicrobial prophylaxis required for hematological malignancy   Declines COVID 19 vaccine and flu vaccines  Continue TMP-SMX BID S/S  for PJP ppx (1 year supply sent 08/30/22). Could consider stopping but he's tolerating right now and risk/benefit favors continuing at this time  Continue oral valacyclovir 500mg  daily (year refill sent 08/30/22)--we let him know he could stop this if he wanted. Note he is HSV1 IgG positive. If he stops, we would recommend Shingles vaccine series. He elected to continue ppx today    Intensive toxicity monitoring for prescription antimicrobials   CBC w diff and CMP q3 months .  Sun screen while on doxycycline    Follow up 6-12 months        I personally spent 55 minutes face-to-face and non-face-to-face in the care of this patient, which includes all pre, intra, and post visit time on the date of service.  All documented time was specific to the E/M visit and does not include any procedures that may have been performed.    Recommendations were communicated via shared medical record.    Audley Hose, MD  [place] [facility]      Subjective     External record(s): Primary team note: reviewed recent discharge summary. Patient admitted with chills/fatigue/dyspnea/night sweats also with worsening ear/jaw pain Seen by ICID, pulm, ENT. ENT recommended flexible laryngoscopy, patient declined. Had worsening ear/facial pain, he self-discharged home. Plazed on linezolid on pip+tazo, discharged on doxy (chronic) and levofloxacin planned EOT 4/8. Saw Dr. Senaida Ores (hem/onc) on 4/3, pulm on  4/17. Hem/onc again on 4/23 [facility])--patient overall not feeling well   .    Independent historian(s): no independent historian required.       Interval History:   Per above.  Additionally,   Feeling quite tired and depressed--not doing his woodwork  Not using flonase  Declining CPAP and pulmonary rehab right now    Medications:    Current Outpatient Medications:     albuterol HFA 90 mcg/actuation inhaler, Inhale 2 puffs every six (6) hours as needed for wheezing. (Patient not taking: Reported on 08/30/2022), Disp: 18  g, Rfl: 0    amLODIPine (NORVASC) 10 MG tablet, Take 1 tablet (10 mg total) by mouth daily., Disp: 30 tablet, Rfl: 0    apixaban (ELIQUIS) 5 mg Tab, Take 1 tablet (5 mg total) by mouth two (2) times a day., Disp: 60 tablet, Rfl: 6    arm brace (WRIST BRACE) Misc, 1 Piece by Miscellaneous route as needed. left ulnar wrist brace, Disp: 1 each, Rfl: 0    carvediloL (COREG) 12.5 MG tablet, Take 1 tablet (12.5 mg total) by mouth Two (2) times a day., Disp: 180 tablet, Rfl: 1    docusate sodium (COLACE) 100 MG capsule, Take 1 capsule (100 mg total) by mouth daily., Disp: , Rfl:     doxycycline (VIBRA-TABS) 100 MG tablet, Take 1 tablet (100 mg total) by mouth two (2) times a day., Disp: 180 tablet, Rfl: 3    ergocalciferol-1,250 mcg, 50,000 unit, (DRISDOL) 1,250 mcg (50,000 unit) capsule, Take 1 capsule (1,250 mcg total) by mouth once a week., Disp: 8 capsule, Rfl: 0    hydroCHLOROthiazide (HYDRODIURIL) 12.5 MG tablet, Take 1 tablet (12.5 mg total) by mouth daily., Disp: 30 tablet, Rfl: 11    mirtazapine (REMERON) 30 MG tablet, Take 1 tablet (30 mg total) by mouth nightly., Disp: 90 tablet, Rfl: 3    naloxone (NARCAN) 4 mg nasal spray, One spray in either nostril once for known/suspected opioid overdose. May repeat every 2-3 minutes in alternating nostril til EMS arrives (Patient not taking: Reported on 08/30/2022), Disp: 2 each, Rfl: 0    nortriptyline (PAMELOR) 50 MG capsule, TAKE 2 CAPSULES BY MOUTH EVERY DAY AT NIGHT, Disp: 180 capsule, Rfl: 4    omeprazole (PRILOSEC) 40 MG capsule, Take 1 capsule (40 mg total) by mouth every morning., Disp: , Rfl:     ondansetron (ZOFRAN-ODT) 4 MG disintegrating tablet, Take 1 tablet (4 mg total) by mouth every eight (8) hours as needed for nausea., Disp: , Rfl:     oxyCODONE (ROXICODONE) 10 mg immediate release tablet, Take 1 tablet (10 mg total) by mouth every four (4) hours as needed for pain., Disp: 120 tablet, Rfl: 0    PONATinib (ICLUSIG) 30 mg tablet, Take 1 tablet (30 mg total) by mouth daily. Swallow tablets whole. Do not crush, break, cut or chew tablets., Disp: 30 tablet, Rfl: 5    spironolactone (ALDACTONE) 25 MG tablet, Take 1 tablet (25 mg total) by mouth daily., Disp: 90 tablet, Rfl: 3    sulfamethoxazole-trimethoprim (BACTRIM DS) 800-160 mg per tablet, TAKE 1 TABLET BY MOUTH 2 TIMES A DAY ON SATURDAY, SUNDAY. FOR PROPHYLAXIS WHILE ON CHEMO., Disp: 48 tablet, Rfl: 3    tiotropium-olodaterol (STIOLTO RESPIMAT) 2.5-2.5 mcg/actuation Mist, Inhale 2 puffs daily. (Patient not taking: Reported on 08/30/2022), Disp: 4 g, Rfl: 3 tiotropium-olodaterol (STIOLTO RESPIMAT) 2.5-2.5 mcg/actuation Mist, Inhale 2 puffs daily. (Patient not taking: Reported on 08/30/2022), Disp: 4 g, Rfl: 0    tiotropium-olodaterol (STIOLTO RESPIMAT) 2.5-2.5 mcg/actuation Mist, Inhale 2 puffs daily. (Patient not taking: Reported on 08/30/2022), Disp: 4 g, Rfl: 0    valACYclovir (VALTREX) 500 MG tablet, Take 1 tablet (500 mg total) by mouth daily., Disp: 90 tablet, Rfl: 11    Objective     Vital signs:  There were no vitals taken for this visit.    Physical Exam:  Const [x]  vital signs above    [x]  NAD, non-toxic appearance []  Chronically ill-appearing, non-distressed        Eyes [x]  Lids normal bilaterally, conjunctiva anicteric  and noninjected OU     [] PERRL  [] EOMI        ENMT [x]  Normal appearance of external nose and ears, no nasal discharge        []  MMM, no lesions on lips or gums []  No thrush, leukoplakia, oral lesions  []  Dentition good []  Edentulous []  Dental caries present  []  Hearing normal  []  TMs with good light reflexes bilaterally         Neck []  Neck of normal appearance and trachea midline        []  No thyromegaly, nodules, or tenderness   []  Full neck ROM        Lymph []  No LAD in neck     []  No LAD in supraclavicular area     []  No LAD in axillae   []  No LAD in epitrochlear chains     []  No LAD in inguinal areas        CV []  RRR            []  No peripheral edema     []  Pedal pulses intact   []  No abnormal heart sounds appreciated   []  Extremities WWP         GI []  Normal inspection, NTND   []  NABS     []  No umbilical hernia on exam       []  No hepatosplenomegaly     []  Inspection of perineal and perianal areas normal        GU []  Normal external genitalia     [] No urinary catheter present in urethra   []  No CVA tenderness    []  No tenderness over renal allograft        MSK []  No clubbing or cyanosis of hands       []  No vertebral point tenderness  []  No focal tenderness or abnormalities on palpation of joints in RUE, LUE, RLE, or LLE        Skin []  No rashes, lesions, or ulcers of visualized skin     []  Skin warm and dry to palpation   Fair skin with numerous freckles      Neuro [x]  Face expression symmetric  []  Sensation to light touch grossly intact throughout    []  Moves extremities equally    []  No tremor noted        []  CNs II-XII grossly intact     []  DTRs normal and symmetric throughout []  Gait unremarkable  \      Psych []  Appropriate affect       [x]  Fluent speech         [x]  Attentive, good eye contact  [x]  Oriented to person, place, time          [x]  Judgment and insight are appropriate   Flat affect        Data for Medical Decision Making     I discussed mgm't w/qualified health care professional(s) involved in case: recs with oncology team .    I reviewed CBC results (4/23 WBC normal), chemistry results (4/23 Cr improved to 1.7), and micro result(s) (4/23 EBV and CMR PCRs from blood neg).    I independently visualized/interpreted CT images (CTA chest from 3/29 with emphysema in upper lobes, simlar to prior).       Recent Labs     Units 08/30/22  0735   WBC 10*9/L 8.5   HGB g/dL 56.4   PLT 33*2/R 518   NEUTROABS 10*9/L 5.4  LYMPHSABS 10*9/L 2.2   EOSABS 10*9/L 0.3   BUN mg/dL 9   CREATININE mg/dL 9.14*   AST U/L 19   ALT U/L 35   BILITOT mg/dL 0.6   ALKPHOS U/L 89   K mmol/L 3.5   CALCIUM mg/dL 8.8       Lab Results   Component Value Date    CRP 14.0 (H) 07/26/2022    Total IgG 284 (L) 12/02/2021         Imaging:  No results found.

## 2022-08-30 ENCOUNTER — Telehealth
Admit: 2022-08-30 | Discharge: 2022-08-30 | Payer: MEDICAID | Attending: Student in an Organized Health Care Education/Training Program | Primary: Student in an Organized Health Care Education/Training Program

## 2022-08-30 ENCOUNTER — Ambulatory Visit
Admit: 2022-08-30 | Discharge: 2022-08-30 | Payer: MEDICAID | Attending: Infectious Disease | Primary: Infectious Disease

## 2022-08-30 ENCOUNTER — Other Ambulatory Visit: Admit: 2022-08-30 | Discharge: 2022-08-30 | Payer: MEDICAID

## 2022-08-30 ENCOUNTER — Ambulatory Visit: Admit: 2022-08-30 | Discharge: 2022-08-30 | Payer: MEDICAID | Attending: Adult Health | Primary: Adult Health

## 2022-08-30 DIAGNOSIS — C9100 Acute lymphoblastic leukemia not having achieved remission: Principal | ICD-10-CM

## 2022-08-30 DIAGNOSIS — C9101 Acute lymphoblastic leukemia, in remission: Principal | ICD-10-CM

## 2022-08-30 DIAGNOSIS — G893 Neoplasm related pain (acute) (chronic): Principal | ICD-10-CM

## 2022-08-30 DIAGNOSIS — R5383 Other fatigue: Principal | ICD-10-CM

## 2022-08-30 DIAGNOSIS — F32A Depression, unspecified depression type: Principal | ICD-10-CM

## 2022-08-30 DIAGNOSIS — C9500 Acute leukemia of unspecified cell type not having achieved remission: Principal | ICD-10-CM

## 2022-08-30 DIAGNOSIS — Z515 Encounter for palliative care: Principal | ICD-10-CM

## 2022-08-30 LAB — COMPREHENSIVE METABOLIC PANEL
ALBUMIN: 4.1 g/dL (ref 3.4–5.0)
ALKALINE PHOSPHATASE: 89 U/L (ref 46–116)
ALT (SGPT): 35 U/L (ref 10–49)
ANION GAP: 8 mmol/L (ref 5–14)
AST (SGOT): 19 U/L (ref <=34)
BILIRUBIN TOTAL: 0.6 mg/dL (ref 0.3–1.2)
BLOOD UREA NITROGEN: 9 mg/dL (ref 9–23)
BUN / CREAT RATIO: 5
CALCIUM: 8.8 mg/dL (ref 8.7–10.4)
CHLORIDE: 109 mmol/L — ABNORMAL HIGH (ref 98–107)
CO2: 24 mmol/L (ref 20.0–31.0)
CREATININE: 1.7 mg/dL — ABNORMAL HIGH
EGFR CKD-EPI (2021) MALE: 51 mL/min/{1.73_m2} — ABNORMAL LOW (ref >=60)
GLUCOSE RANDOM: 138 mg/dL (ref 70–179)
POTASSIUM: 3.5 mmol/L (ref 3.4–4.8)
PROTEIN TOTAL: 6.2 g/dL (ref 5.7–8.2)
SODIUM: 141 mmol/L (ref 135–145)

## 2022-08-30 LAB — CBC W/ AUTO DIFF
BASOPHILS ABSOLUTE COUNT: 0.1 10*9/L (ref 0.0–0.1)
BASOPHILS RELATIVE PERCENT: 1.4 %
EOSINOPHILS ABSOLUTE COUNT: 0.3 10*9/L (ref 0.0–0.5)
EOSINOPHILS RELATIVE PERCENT: 4 %
HEMATOCRIT: 45.5 % (ref 39.0–48.0)
HEMOGLOBIN: 16.1 g/dL (ref 12.9–16.5)
LYMPHOCYTES ABSOLUTE COUNT: 2.2 10*9/L (ref 1.1–3.6)
LYMPHOCYTES RELATIVE PERCENT: 26.3 %
MEAN CORPUSCULAR HEMOGLOBIN CONC: 35.4 g/dL (ref 32.0–36.0)
MEAN CORPUSCULAR HEMOGLOBIN: 29.6 pg (ref 25.9–32.4)
MEAN CORPUSCULAR VOLUME: 83.6 fL (ref 77.6–95.7)
MEAN PLATELET VOLUME: 9.3 fL (ref 6.8–10.7)
MONOCYTES ABSOLUTE COUNT: 0.4 10*9/L (ref 0.3–0.8)
MONOCYTES RELATIVE PERCENT: 4.8 %
NEUTROPHILS ABSOLUTE COUNT: 5.4 10*9/L (ref 1.8–7.8)
NEUTROPHILS RELATIVE PERCENT: 63.5 %
PLATELET COUNT: 151 10*9/L (ref 150–450)
RED BLOOD CELL COUNT: 5.44 10*12/L (ref 4.26–5.60)
RED CELL DISTRIBUTION WIDTH: 15.6 % — ABNORMAL HIGH (ref 12.2–15.2)
WBC ADJUSTED: 8.5 10*9/L (ref 3.6–11.2)

## 2022-08-30 LAB — SLIDE REVIEW

## 2022-08-30 MED ORDER — DOXYCYCLINE HYCLATE 100 MG TABLET
ORAL_TABLET | Freq: Two times a day (BID) | ORAL | 3 refills | 90 days | Status: CP
Start: 2022-08-30 — End: ?

## 2022-08-30 MED ORDER — SULFAMETHOXAZOLE 800 MG-TRIMETHOPRIM 160 MG TABLET
ORAL_TABLET | Freq: Two times a day (BID) | ORAL | 3 refills | 24 days | Status: CP
Start: 2022-08-30 — End: ?

## 2022-08-30 MED ORDER — OXYCODONE 10 MG TABLET
ORAL_TABLET | ORAL | 0 refills | 20.00000 days | Status: CP | PRN
Start: 2022-08-30 — End: 2022-08-30

## 2022-08-30 MED ORDER — VALACYCLOVIR 500 MG TABLET
ORAL_TABLET | Freq: Every day | ORAL | 3 refills | 90 days | Status: CP
Start: 2022-08-30 — End: ?

## 2022-08-30 NOTE — Unmapped (Signed)
[facility]    Patient Name: Adam Keith  Patient Age: 42 y.o.  Encounter Date: 08/30/2022    Primary Care Provider:  Dierdre Harness, MD    Referring Physician:  Dario Ave  [address]  [id number]  [place],  [place] [address]    Reason for visit:  Ph+ B-ALL follow up     Assessment:  A 42 y.o. 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 The course was complicated by septic shock, candida krusei fungemia, MRSA bacteremia with septic emboli c/b acute renal failure and respiratory distress requiring dialysis and intubation.  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  He subsequently had difficulty tolerating single agent dasatinib (as a bridge to planned ponatinib-blinatumomab--had fluid retention and pleural effusion).  He then completed 4 cycles of ponatinib-blinatumomab, initiated for treatment of MRD in the setting of poor tolerance of standard therapy.      Patient is currently on ponatinib 30 mg monotherapy (since 01/2021).  When we decreased his ponatinib to 15 mg he had a rise in transcripts. Most recent BCR-ABL is + at 0.001. Level is pending from today.    He continues to feel poorly, although with no new symptoms. We discussed the option of decreasing the ponatinib (which might be contributing to symptoms) but, given the rise in BCR/ABL transcripts in the past he he not interested. I encouraged him to reconsider pulmonary rehab and decreasing cigarette use. I will update [name] about Adam Keith's ongoing depression.  Discussed with Dr. [name], ICID, who saw him today too. She considered stopping Bactrim but given his fragile pulmonary health she decided to continue it.      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   - No plan for further IVIG given several reactions   - Rec Flu and Covid vaccines     DRESS: followed by Dr. 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 Now s/p dialysis  - stable Cr appears to have developed CKD due to incompletely healed acute injury.  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 CT chest 02/06/22 with residual subacute thrombus.   - Indefinite anticoagulation with apixaban (in addition to asa given ponatinib)     Vitamin D Deficiency: was low 07/2022.   - started supplementation 07/2022.   - He has not noticed any change in symptoms with it. Will recheck level in 1 month.     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 [name] and Dr. Verlan Friends and Dr. Maple Mirza following patient.    - long term doxycycline with goal of decolonization  **Hx Candidemia during induction - now s/p Cresemba  - followed by Dr. [name]  **Hx Covid-19 x 4 documented   - possible long-Covid symptoms    Symptom management: bowel regimen as listed in medication list, analgesics as listed in medication list or symptoms reviewed and due to acceptable control, no changes made  **Peripheral neuropathy, sensory and motor  - Grade 1-2 monitor  **Chronic Back Pain, complicated by hospital stay and procedures  - Followed by Dr. Genice Rouge, Palliative Care  **Depression/anxiety: met with [name], [facility]  - nortriptyline  - PRN clonazepam  - has not been using PRN hydroxyzine    Venous access: I recommend the following change for venous access : PICC line removal      Supportive Care Recommendations:  We recommend based on the patient???s underlying diagnosis and treatment history the following supportive care:    1. Antimicrobial prophylaxis:    - Viral - valacyclovir 500mg  daily  - Bacterial - levofloxacin 500 mg if ANC <0.5  - Fungal - previously on treatment isavuconazole - off now  - PJP - On Bactrim DS BID Sat/Sun  - recommend Annual flu shot - declines thus far  - recommend updated Covid vaccine - declines    2. Blood product support:  I recommend the following intervals for laboratory monitoring to determine transfusion needs and monitor for hematologic effects of therapy and underlying disease: no routine monitoring outside of clinic follow-up    Leukoreduced blood products are required.  Irradiated blood products are preferred, but in case of urgent transfusion needs non-irradiated blood products may be used:     -  RBC transfusion threshold: transfuse 2 units for Hgb < 8 g/dL.  -  Platelet transfusion threshold: transfuse 1 unit of platelets for platelet count < 10, or for bleeding or need for invasive procedure.    3. Hematopoietic growth factor support: none    Care coordination: The next [facility] Follow up requested:  - 3 month followup    I personally spent 40 minutes face-to-face and non-face-to-face in the care of this patient, which includes all pre, intra, and post visit time on the date of service.  All documented time was specific to the E/M visit and does not include any procedures that may have been performed.    [name], AGPCNP-BC  Nurse Practitioner  Hematology/Oncology  [facility]    Mariel Aloe, MD was available  Leukemia Program  Division of Hematology  [facility]     Nurse Navigator (non-clinical trial patients): Elicia Lamp, RN        Tel. [phone]       Fax. [phone]  Toll-free appointments: [phone]  Scheduling assistance: [phone]  After hours/weekends: [phone] (ask for adult hematology/oncology on-call)            History of Present Illness:  We had the pleasure of seeing Adam Keith in the Leukemia Clinic at the [facility] [place] on 08/30/2022.  He is a 42 y.o. male with  Ph+ B-ALL .  His oncologic history is as follows:      Hematology/Oncology History Overview Note   Referring/Local Oncologist: None    Diagnosis:Ph+ ALL    Genetics:    Karyotype/FISH:Abnormal Karyotype: 46,XY,t(9;22)(q34;q11.2)[1]/45,XY,der(7;9)(q10;q10)t(9;22)(q34;q11.2),der(22)t(9;22)[11]/46,sdl,+der(22)t(9;22)[5]/46,XY[3]     Abnormal FISH: A BCR/ABL1 interphase FISH assay shows an abnormal signal pattern in 97% of the 100 cells scored. 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  The findings support a diagnosis of leukemia and have implications for targeted therapy and for monitoring residual disease.      Molecular Genetics:  BCR-ABL1 p210 transcripts were detected at a level of 46.479 IS% ratio in bone marrow.  BCR-ABL1 p190 transcripts were detected at a level of 4 in 100,000 cells in bone marrow.    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)     01/24/2020 - 04/02/2020 Chemotherapy    IP/OP LEUKEMIA GRAAPH-2005 + RITUXIMAB < 43 YO  rituximab hypercvad (odd and even course)     02/24/2020 Remission    CR with PCR MRD+; marrow showing 70% blasts with <1% blasts, negative flow MRD, BCR-ABL p210 PCR 0.197%; CNS negative for blasts     03/09/2020 Progression    CSF - rare blast ID'ed - IT chemo given    03/13/20 - IT chemo, CSF negative     04/16/2020 Biopsy    BM Bx with 40-50% cellularity, <1% blasts, MRD flow cytometry negative, BCR-ABL p210 transcripts 0.036%     05/28/2020 - 05/28/2020 Chemotherapy    OP AML - CNS THERAPY (INTRATHECAL CYTARABINE, INTRATHECAL METHOTREXATE, OR INTRATHECAL TRIPLE)  Select one of the following: cytarabine IT 100 mg with hydrocortisone 50 mg, cytarabine IT 40 mg with methotrexate 15 mg with hydrocortisone 50 mg, OR methotrexate IT 12 mg with hydrocortisone 50 mg     06/19/2020 -  Chemotherapy    IP/OP LEUKEMIA BLINATUMOMAB 7-DAY INFUSION (MINIMAL RESIDUAL DISEASE; WT >= 22 KG) (HOME INFUSION)      Cycles 1*-4: Blinatumomab 28 mcg/day Days 1-28 of 6-week cycle.  *Given in the inpatient setting on Days 1-3 on Cycle 1 and Days 1-2 on Cycle 2, while other treatment days are given in the outpatient setting.    Cycle 1 MRD Dosing     07/18/2020 Adverse Reaction    Hospitalization: fevers. Pneumonia     07/23/2020 Biopsy    Diagnosis  Bone marrow, right iliac, aspiration and biopsy  -   Normocellular bone marrow (50%) with trilineage hematopoiesis and 1% blasts by manual aspirate differential  -   Flow cytometry MRD analysis reveals no definitive immunophenotypic evidence of residual B lymphoblastic leukemia   - BCR-ABL p210 0.006%         08/10/2020 -  Chemotherapy    Cycle 2 blinatumomab-ponatinib  Ponatinib 30mg     1 IT per cycle     09/21/2020 Adverse Reaction    Covid-19, symptomatic but not requiring hospitalization.     10/06/2020 -  Chemotherapy    Cycle 3 blinatumomab-ponatinib  Ponatinib 30mg        11/01/2020 Adverse Reaction    Hospitalization: MRSA Pneumonia requiring intubation    Blinatumomab stopped.  Ponatinib held until he stabilized.  12/14/2020 -  Chemotherapy    Cycle 4 blinatumomab 28 mcg/day days 1-28, ponatinib 30 mg per day IT triple therapy day 29     01/13/2021 -  Chemotherapy    Ponatinib 30mg  daily  - due to due low level BCR-ABL     03/06/2021 Adverse Reaction    Hospitalization - ICU on ventilator - influenza and MRSA/klebsiella PNA     06/13/2022 -  Chemotherapy    OP LEUKEMIA VINCRISTINE  vinCRIStine 2 mg IV on day 1     ALL (acute lymphoblastic leukemia) (CMS-HCC)   06/11/2020 -  Chemotherapy    IP/OP LEUKEMIA BLINATUMOMAB 7-DAY INFUSION (MINIMAL RESIDUAL DISEASE; WT >= 22 KG) (HOME INFUSION)  Cycles 1*-4: Blinatumomab 28 mcg/day Days 1-28 of 6-week cycle.  *Given in the inpatient setting on Days 1-3 on Cycle 1 and Days 1-2 on Cycle 2, while other treatment days are given in the outpatient setting.     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  *Given in the inpatient setting on Days 1-3 on Cycle 1 and Days 1-2 on Cycle 2, while other treatment days are given in the outpatient setting.     11/09/2020 Initial Diagnosis    Acute lymphoblastic leukemia in remission (CMS-HCC)     06/13/2022 -  Chemotherapy    OP LEUKEMIA VINCRISTINE  vinCRIStine 2 mg IV on day 1         Interim History:    Since his last visit he reports that he has not felt well.  Limited energy.  He works in his [employer] for only an hour or so.  Back pain has increased.  He isn't finding enjoyment in anything.  Sleeping 12-14 hours a day.  No fevers/chills.  No change in respiratory complaints. Still smoking.      Past Medical, Surgical and Family History were reviewed and pertinent updates were made in the Electronic Medical Record      ECOG Performance Status: 1    Medications:      Current Outpatient Medications   Medication Sig Dispense Refill    amLODIPine (NORVASC) 10 MG tablet Take 1 tablet (10 mg total) by mouth daily. 30 tablet 0    apixaban (ELIQUIS) 5 mg Tab Take 1 tablet (5 mg total) by mouth two (2) times a day. 60 tablet 6    arm brace (WRIST BRACE) Misc 1 Piece by Miscellaneous route as needed. left ulnar wrist brace 1 each 0    carvediloL (COREG) 12.5 MG tablet Take 1 tablet (12.5 mg total) by mouth Two (2) times a day. 180 tablet 1    docusate sodium (COLACE) 100 MG capsule Take 1 capsule (100 mg total) by mouth daily.      ergocalciferol-1,250 mcg, 50,000 unit, (DRISDOL) 1,250 mcg (50,000 unit) capsule Take 1 capsule (1,250 mcg total) by mouth once a week. 8 capsule 0    hydroCHLOROthiazide (HYDRODIURIL) 12.5 MG tablet Take 1 tablet (12.5 mg total) by mouth daily. 30 tablet 11    mirtazapine (REMERON) 30 MG tablet Take 1 tablet (30 mg total) by mouth nightly.  90 tablet 3    nortriptyline (PAMELOR) 50 MG capsule TAKE 2 CAPSULES BY MOUTH EVERY DAY AT NIGHT 180 capsule 4    omeprazole (PRILOSEC) 40 MG capsule Take 1 capsule (40 mg total) by mouth every morning.      ondansetron (ZOFRAN-ODT) 4 MG disintegrating tablet Take 1 tablet (4 mg total) by mouth every eight (8) hours as needed for nausea.      PONATinib (ICLUSIG) 30 mg tablet Take 1 tablet (30 mg total) by mouth daily. Swallow tablets whole. Do not crush, break, cut or chew tablets. 30 tablet 5    spironolactone (ALDACTONE) 25 MG tablet Take 1 tablet (25 mg total) by mouth daily. 90 tablet 3    albuterol HFA 90 mcg/actuation inhaler Inhale 2 puffs every six (6) hours as needed for wheezing. (Patient not taking: Reported on 08/30/2022) 18 g 0 doxycycline (VIBRA-TABS) 100 MG tablet Take 1 tablet (100 mg total) by mouth two (2) times a day. 180 tablet 3    naloxone (NARCAN) 4 mg nasal spray One spray in either nostril once for known/suspected opioid overdose. May repeat every 2-3 minutes in alternating nostril til EMS arrives (Patient not taking: Reported on 08/30/2022) 2 each 0    oxyCODONE (ROXICODONE) 10 mg immediate release tablet Take 1 tablet (10 mg total) by mouth every four (4) hours as needed for pain. 120 tablet 0    sulfamethoxazole-trimethoprim (BACTRIM DS) 800-160 mg per tablet Take 1 tablet (160 mg of trimethoprim total) by mouth two (2) times a day. TAKE 1 TABLET BY MOUTH 2 TIMES A DAY ON SATURDAY, SUNDAY. FOR PROPHYLAXIS 48 tablet 3    tiotropium-olodaterol (STIOLTO RESPIMAT) 2.5-2.5 mcg/actuation Mist Inhale 2 puffs daily. (Patient not taking: Reported on 08/30/2022) 4 g 3    tiotropium-olodaterol (STIOLTO RESPIMAT) 2.5-2.5 mcg/actuation Mist Inhale 2 puffs daily. (Patient not taking: Reported on 08/30/2022) 4 g 0    tiotropium-olodaterol (STIOLTO RESPIMAT) 2.5-2.5 mcg/actuation Mist Inhale 2 puffs daily. (Patient not taking: Reported on 08/30/2022) 4 g 0    valACYclovir (VALTREX) 500 MG tablet Take 1 tablet (500 mg total) by mouth daily. 90 tablet 3     No current facility-administered medications for this visit.       Vital Signs:  Vitals:    08/30/22 0819   BP: 119/83   Pulse: 73   Resp: 16   Temp: 36.6 ??C (97.8 ??F)   SpO2: 94%       Vitals reviewed in EMR 08/30/2022 - stable with slightly elevated BP    GENERAL: Well-appearing white man. NAD but depressed. Accompanied by his wife  HEENT: clear sclera  HEART: Normal color, not excessive pallor. Not ashen.   CHEST/LUNG: Normal work of breathing. No dyspnea with conversation.   EXTREMITIES: Not evaluated.   SKIN: No noticable rash or petechiae.   NEURO EXAM: Grossly intact.     MDM:  1 chronic life threatening illness  Drug therapy requiring intense monitoring for toxicity     .

## 2022-08-30 NOTE — Unmapped (Signed)
Your cell counts are stable. The BCR/ABL level is in process but was undetectable last visit.    Continue the ponatinib and apixaban without changes.    I encourage you to reconsider pulmonary rehabilitation. Whatever you can do to improve your lung function would be great.    Continue to work with Dr. Genice Rouge and [name] to support you as you cope with your chronic pain and health challenges.    We will see you back in 3 months.    Get a vitamin D level when you have completed the vitamin D doses.  The order is in place - just let the lab know to collect it.

## 2022-08-30 NOTE — Unmapped (Signed)
OUTPATIENT ONCOLOGY PALLIATIVE CARE     Principal Diagnosis: [name] is a 42 y.o. 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 He was recently admitted to [facility] with lowe extremity pain and edema. Has had episodic fluctuations in mood.  Has decided not to pursue bone marrow transplant at this time. Has felt chronically ill for the last several months.      Assessment/Plan:      # Depressed Mood: Chronic depression, he has felt more down recently due to constitutional fatigue, pain, inability to go outside and do things that bring meaning to him.  While he has felt down, he does state his mood has been stable for the most part; denies SI/HI. Continues to follow with [name]  -Medications per [name]        #Pain: Chronic, persistent pain, primarily in joints. Associated with/exacerbated by sitting too long, though he also says that activity seems to make it worst. Previously trialed butrans patch but did not find this effective. He feels that current oxycodone regimen helps keep him functional.Currently stable, no changes today    -Continue gabapentin to 600mg  qAM and 900mg  qbedtime  - oxycodone 5 mg to 10 mg q4 prn for breakthrough pain, only using 2-4 times a day  -Encouraged patient to reach out via MyChart with updates in how he is doing in light of these changes    #Persistent productive cough, ENT infections, low grade fevers  -He endorses the above symptoms for the past three months. He has been on different oral antibiotics which do not seem to have provided much relief. Hospitalized earlier this month without clear etiology/change in treatment.      # LUE pain: Improved.   - Pain management as above     #Chronic pain: At baseline, see above  - Pain management as above     #Advance care planning: Continues to prioritize quality of life.  After discussion with infectious disease as well as oncology has made the decision not to pursue another bone marrow transplant at this time.     #Controlled substances risk management:  Patient does not have a signed pain medication agreement with our team.  [place] database was reviewed today and it was appropriate.  Urine drug screen was not performed at this visit. Findings: not applicable.  Patient has received information about safe storage and administration of medications.  Patient has not received a prescription for narcan; is not applicable.      F/u: 4-6 weeks     ----------------------------------------  Referring Provider: Dr. Berline Lopes  Oncology Team: Malignant hematology team  PCP: [name], MD     HPI: 42 year old man with a diagnosis of B-ALL, diagnosed in October 2021, now in complete remission. Recently admitted to the [facility] ICU 03/06/21-03/20/21 for AHRF d/t MRSA/klebsiella PNA, requiring intubation. This is following admissions in August, May, April, March, February, and January 2022.     Interval History:   Planning to fu with his PCP about chronic back pain. Continues to feel poorly overall. Fatigued all the time.     Briefer visit today because he was on his way home after seeing oncology and ID. He requests to keep his pain meds at the current level for now. Will fu with [name] about his mood. Continues to be frustrated, also continues to deny any safety concerns.      Denies nausea or vomiting. Having regular BMs.  Palliative Performance Scale: 90% - Ambulation: Full / Normal Activity, some evidence of disease / Self-Care:Full / Intake: Normal / Level of Conscious: Full     Coping/Support Issues: Having a lot of difficulty coping with his current setback. Well supported by his wife. Also has a psychiatrist and therapist.     Goals of Care: Treatment and cancer-directed therapy     Social History:  Name of primary support: [name], 2 daughters.  Occupation: Was in the [employer]	[place]  Hobbies: Woodworking  Current residence / distance from [facility]: [place]     Has a 11 year old son. Advance Care Planning: Did not discuss this visit.        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    Onion Anaphylaxis    Vancomycin Analogues Other (See Comments)       Hearing loss with Lasix     Other reaction(s): Other (See Comments)     Hearing loss     lasix     Hearing loss      lasix      Hearing loss with Lasix Other reaction(s): Other (See Comments) Hearing loss lasix    Bismuth Subsalicylate Nausea And Vomiting    Privigen [Immun Glob G(Igg)-Pro-Iga 0-50] Other (See Comments)       03/10/2021 VIG infusion stopped as patient developed rigors, tachycardia and HTN during infusion despite pre-medication; 03/12/21 completed IVIG with premedications and slower rate of infusion    Furosemide Other (See Comments)       With Vancomycin caused hearing loss     Other reaction(s): Other (See Comments)     With Vancomycin caused hearing loss      With Vancomycin caused hearing loss Other reaction(s): Other (See Comments) With Vancomycin caused hearing loss    Gammagard           Family History:  Cancer-related family history includes Cancer in his maternal grandfather and mother. There is no history of Melanoma.  He indicated that the status of his mother is unknown. He indicated that the status of his father is unknown. He indicated that the status of his sister is unknown. He indicated that the status of his brother is unknown. He indicated that the status of his maternal grandmother is unknown. He indicated that the status of his maternal grandfather is unknown. He indicated that the status of his paternal grandmother is unknown. He indicated that the status of his paternal grandfather is unknown. He indicated that the status of his maternal aunt is unknown. He indicated that the status of his maternal uncle is unknown. He indicated that the status of his paternal aunt is unknown. He indicated that the status of his paternal uncle is unknown. He indicated that the status of his neg hx is unknown.        REVIEW OF SYSTEMS:  A comprehensive review of 10 systems was negative except for pertinent positives noted in HPI.  Lab Results   Component Value Date     CREATININE 1.93 (H) 02/22/2022            Lab Results   Component Value Date     ALKPHOS 92 02/22/2022     BILITOT 0.4 02/22/2022     BILIDIR 0.20 02/07/2022     PROT 6.6 02/22/2022     ALBUMIN 4.1 02/22/2022     ALT 96 (H) 02/22/2022     AST 45 (H) 02/22/2022      Physical exam:  Gen: NAD  Pulm: nl WOB  Skin: no rashes on exposed skin  Psych: A&O x3        The patient reports they are physically located in [place] and is currently: not at home. I conducted a audio/video visit. I spent 12 minutes on the video call with the patient. I spent an additional 10 minutes on pre- and post-visit activities on the date of service .     Marita Kansas, MD  [facility]

## 2022-09-01 NOTE — Unmapped (Signed)
The [facility] has made a third and final attempt to reach this patient to refill the following medication:Iclusig.      We have left voicemails on the following phone numbers: [phone], have sent a text message to the following phone numbers: [phone], and have sent a Mychart questionnaire..    Dates contacted: 5/14,21,30  Last scheduled delivery: 07/25/22    The patient may be at risk of non-compliance with this medication. The patient should call the [facility] at [phone]  Option 4, then Option 1: Oncology to refill medication.    Adam Keith   [facility] Specialty Technician

## 2022-09-06 ENCOUNTER — Telehealth: Admit: 2022-09-06 | Discharge: 2022-09-07 | Payer: MEDICAID

## 2022-09-06 DIAGNOSIS — F419 Anxiety disorder, unspecified: Principal | ICD-10-CM

## 2022-09-06 DIAGNOSIS — F331 Major depressive disorder, recurrent, moderate: Principal | ICD-10-CM

## 2022-09-06 DIAGNOSIS — F4310 Post-traumatic stress disorder, unspecified: Principal | ICD-10-CM

## 2022-09-06 MED ORDER — MIRTAZAPINE 45 MG TABLET
ORAL_TABLET | Freq: Every evening | ORAL | 11 refills | 30 days | Status: CP
Start: 2022-09-06 — End: 2023-09-01

## 2022-09-06 NOTE — Unmapped (Signed)
Comprehensive Cancer Support Program (CCSP) - Psychiatry Outpatient Clinic   After Visit Summary    It was a pleasure to see you today in the [facility] Comprehensive Cancer Support Program (CCSP). The CCSP is a multidisciplinary program dedicated to helping patients, caregivers, and families with cancer treatment, recovery and survivorship.      To schedule, cancel, or change your appointment:  Please call the [facility] schedulers at [phone], Monday through Friday 8AM - 5PM.  Someone will return your call within 24 hours.      If you have a question about your medicines or you need to contact your provider:  First, try sending a My Chart message to Erikka Follmer, PMHNP. If you are unable to do so, please call the CCSP program coordinator, Devon Suitt, at [phone].     For after hours urgent issues, you may call [phone] or call the "I need to talk" line at 1-800-273-TALK (8255) anytime 24/7.    CCSP Patient and Family Resource Center: [phone].    CCSP Website:  [email or web address]    For prescription refills, please allow at least 24 hours (during business hours, M-F) for providers to call in refills to your pharmacy. We are generally unable to accommodate same-day requests for refills.     If you are taking any controlled substances (such as anxiety or sleep medications), you must use them as the directions say to use them. We generally do not provide early refills over the phone without clear reason, and it would be inappropriate to obtain the medications from other doctors. We routinely use the  controlled substance database to monitor prescription drug use.

## 2022-09-06 NOTE — Unmapped (Signed)
[facility]  Psychiatry--Comprehensive Cancer Support Program   Established Patient E&M Service - Outpatient       Assessment:    Adam Keith presents for follow-up evaluation. Patient continues to struggle with depression and anxiety (moderate to severe). Increasing his mirtazapine from 30 mg to 45 mg and continuing his nortriptyline 100 mg at bedtime. Will reassess on 10/04/22.    Identifying Information:  Adam Keith is a 42 y.o. male with a history of PH+ B cell ALL, recurrent major depressive disorder, PTSD, anxiety, and insomnia. He has had a long struggle with intermittent depressive episodes with one suicide attempt at age 10.      Risk Assessment:  An assessment of suicide and violence risk factors was performed as part of this evaluation and is not significantly increased from the last visit.   While future psychiatric events cannot be accurately predicted, the patient does not currently require acute inpatient psychiatric care and does not currently meet [facility] involuntary commitment criteria.      Plan:    Problem 1: Major Depressive Disorder  Status of problem: chronic with moderate to severe exacerbation  Interventions:   Continue nortriptyline 100 mg at bedtime.    Increase mirtazapine from 30 mg to 45 mg at bedtime   Encouraged psychotherapy with local therapist     Problem 2: Anxiety/PTSD  Status of problem: chronic with moderate to severe exacerbation  Interventions:   See MDD above     Problem 3: Insomnia  Status of problem:  chronic and stable  Interventions:   See mirtazapine plan above      Psychotherapy provided:  No billable psychotherapy service provided.    Patient has been given this writer's contact information as well as the [facility] urgent line number. The patient has been instructed to call [phone] for emergencies.      Subjective:    Chief complaint:  Follow-up psychiatric evaluation for depression and anxiety    Interval History:   Patient reporting profound fatigue making it difficult to participate with many of his work duties, but he continues to go to work. States that he is sleeping too much of late (he would sleep 16 hours a day if he could). I don't have any energy. I don't have drive. I don't have the energy to do any wood-working, which really sucks.    Depressed mood moderate to severe. Anxiety also moderate to severe.     Prior Psychiatric Medication Trials: bupropion (could not tolerate), duloxetine, mirtazapine (given during recent hospitalization), lorazepam, clonazepam      Psychiatric Review of Systems  Depressive Symptoms: depressed mood, anhedonia, decreased motivation, feelings of worthlessness, fatigue  Manic Symptoms: N/A  Psychosis Symptoms: N/A  Anxiety Symptoms: excessive anxiety, ruminations, restlessness at times, easily fatigued, muscle tension  Panic Symptoms: denies  PTSD Symptoms: denies  Sleep disturbance: hypersomnia  Exercise: Unable to engage in a daily exercise routine.        Objective:      Mental Status Exam:  Appearance:    Appears stated age, Well nourished, and Clean/Neat   Motor:   No abnormal movements   Speech/Language:    Normal rate, volume, tone, fluency   Mood:   Depressed   Affect:   Calm, Cooperative, and Flat   Thought process and Associations:   Logical, linear, clear, coherent, goal directed   Abnormal/psychotic thought content:     Denies SI, HI, self harm, delusions, obsessions, paranoid ideation, or ideas of  reference   Perceptual disturbances:     Denies auditory and visual hallucinations, behavior not concerning for response to internal stimuli     Other:   Insight and judgment intact     I personally spent 26 minutes face-to-face and non-face-to-face in the care of this patient, which includes all pre, intra, and post visit time on the date of service.    Visit was completed by video (or phone) and the appropriate disclaimer has been included below.    The patient reports they are physically located in [place] and is currently: at home. I conducted a audio/video visit. I spent 11 minutes on the video call with the patient. I spent an additional 15 minutes on pre- and post-visit activities on the date of service .       Sheran Lawless, PMHNP  09/06/2022

## 2022-09-08 NOTE — Unmapped (Signed)
[facility] Clinical Assessment & Refill Coordination Note    Adam Keith, DOB: 1980/09/26  Phone: [phone] (home) [phone] (work)    All above HIPAA information was verified with patient's family member, wife.     Was a Nurse, learning disability used for this call? No    Specialty Medication(s):   Hematology/Oncology: Iclusig     Current Outpatient Medications   Medication Sig Dispense Refill    albuterol HFA 90 mcg/actuation inhaler Inhale 2 puffs every six (6) hours as needed for wheezing. (Patient not taking: Reported on 08/30/2022) 18 g 0    amLODIPine (NORVASC) 10 MG tablet Take 1 tablet (10 mg total) by mouth daily. 30 tablet 0    apixaban (ELIQUIS) 5 mg Tab Take 1 tablet (5 mg total) by mouth two (2) times a day. 60 tablet 6    arm brace (WRIST BRACE) Misc 1 Piece by Miscellaneous route as needed. left ulnar wrist brace 1 each 0    carvediloL (COREG) 12.5 MG tablet Take 1 tablet (12.5 mg total) by mouth Two (2) times a day. 180 tablet 1    docusate sodium (COLACE) 100 MG capsule Take 1 capsule (100 mg total) by mouth daily.      doxycycline (VIBRA-TABS) 100 MG tablet Take 1 tablet (100 mg total) by mouth two (2) times a day. 180 tablet 3    ergocalciferol-1,250 mcg, 50,000 unit, (DRISDOL) 1,250 mcg (50,000 unit) capsule Take 1 capsule (1,250 mcg total) by mouth once a week. 8 capsule 0    hydroCHLOROthiazide (HYDRODIURIL) 12.5 MG tablet Take 1 tablet (12.5 mg total) by mouth daily. 30 tablet 11    mirtazapine (REMERON) 45 MG tablet Take 1 tablet (45 mg total) by mouth nightly. 30 tablet 11    naloxone (NARCAN) 4 mg nasal spray One spray in either nostril once for known/suspected opioid overdose. May repeat every 2-3 minutes in alternating nostril til EMS arrives (Patient not taking: Reported on 08/30/2022) 2 each 0    nortriptyline (PAMELOR) 50 MG capsule TAKE 2 CAPSULES BY MOUTH EVERY DAY AT NIGHT 180 capsule 4    omeprazole (PRILOSEC) 40 MG capsule Take 1 capsule (40 mg total) by mouth every morning.      ondansetron (ZOFRAN-ODT) 4 MG disintegrating tablet Take 1 tablet (4 mg total) by mouth every eight (8) hours as needed for nausea.      oxyCODONE (ROXICODONE) 10 mg immediate release tablet Take 1 tablet (10 mg total) by mouth every four (4) hours as needed for pain. 120 tablet 0    PONATinib (ICLUSIG) 30 mg tablet Take 1 tablet (30 mg total) by mouth daily. Swallow tablets whole. Do not crush, break, cut or chew tablets. 30 tablet 5    spironolactone (ALDACTONE) 25 MG tablet Take 1 tablet (25 mg total) by mouth daily. 90 tablet 3    sulfamethoxazole-trimethoprim (BACTRIM DS) 800-160 mg per tablet Take 1 tablet (160 mg of trimethoprim total) by mouth two (2) times a day. TAKE 1 TABLET BY MOUTH 2 TIMES A DAY ON SATURDAY, SUNDAY. FOR PROPHYLAXIS 48 tablet 3    tiotropium-olodaterol (STIOLTO RESPIMAT) 2.5-2.5 mcg/actuation Mist Inhale 2 puffs daily. (Patient not taking: Reported on 08/30/2022) 4 g 3    tiotropium-olodaterol (STIOLTO RESPIMAT) 2.5-2.5 mcg/actuation Mist Inhale 2 puffs daily. (Patient not taking: Reported on 08/30/2022) 4 g 0    tiotropium-olodaterol (STIOLTO RESPIMAT) 2.5-2.5 mcg/actuation Mist Inhale 2 puffs daily. (Patient not taking: Reported on 08/30/2022) 4 g 0  valACYclovir (VALTREX) 500 MG tablet Take 1 tablet (500 mg total) by mouth daily. 90 tablet 3     No current facility-administered medications for this visit.        Changes to medications: Adam Keith reports no changes at this time.    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    Onion Anaphylaxis    Vancomycin Analogues Other (See Comments)     Hearing loss with Lasix    Other reaction(s): Other (See Comments)    Hearing loss    lasix    Hearing loss      lasix      Hearing loss with Lasix Other reaction(s): Other (See Comments) Hearing loss lasix    Bismuth Subsalicylate Nausea And Vomiting    Privigen [Immun Glob G(Igg)-Pro-Iga 0-50] Other (See Comments)     03/10/2021 VIG infusion stopped as patient developed rigors, tachycardia and HTN during infusion despite pre-medication; 03/12/21 completed IVIG with premedications and slower rate of infusion    Furosemide Other (See Comments)     With Vancomycin caused hearing loss    Other reaction(s): Other (See Comments)    With Vancomycin caused hearing loss      With Vancomycin caused hearing loss Other reaction(s): Other (See Comments) With Vancomycin caused hearing loss    Gammagard        Changes to allergies: No    SPECIALTY MEDICATION ADHERENCE     Iclusig 30 mg: 12-14 days of medicine on hand     Are there any concerns with adherence?  Wife denies missed doses.  [facility] dispensed Iclusig 30 mg tablets on 07/22/22 for #30 day supply. Wife reports receiving medication refill in May as well    Adherence counseling provided?  No    Patient-Reported Symptoms Tracker for Cancer Patients on Oral Chemotherapy     Oral chemotherapy medication name(s): Iclusig  Dose and frequency: 30 mg once daily  Oral Chemotherapy Start Date:    Baseline? No  Clinic(s) visited: Hematology    Symptom Grouping Question Patient Response   Digestion and Eating Have you felt sick to your stomach? Denies    Had diarrhea? Denies    Constipated? Denies    Not wanting to eat? Denies    Comments  no new or worsening side effects reported   Sleep and Pain Felt very tired even after you rest? Denies    Pain due to cancer medication or cancer? Denies    Comments no new or worsening side effects reported   Other Side Effects Numbness or tingling in hands and/or feet? Denies    Felt short of breath? Denies    Mouth or throat Sores? Denies    Rash? Denies    Palmar-plantar erythrodysesthesia syndrome?      Rash - acneiform?      Rash - maculo-papular?      How many days over the past month did your cancer medication or cancer keep you from your normal activities?  Write in number of days, 0-30:  0    Other side effects or things you would like to discuss?  Swelling in legs - not a new side effect and provider is aware    Comments?     Adherence  In the last 30 days, on how many days did you miss at least one dose of any of your [drug name]? Write in number of days, 0-30:  0    What reasons are you having trouble taking your medication [pharmacist: check all that apply]? Specify chemotherapy cycle:        No problems identified    Comments:        Comments       Optional Symptom Tracking Comments: [facility] dispensed Iclusig 30 mg tablets on 07/22/22 for #30 day supply. Wife reports receiving medication refill in May as well    CLINICAL MANAGEMENT AND INTERVENTION      Clinical Benefit Assessment:    Do you feel the medicine is effective or helping your condition? Yes    Clinical Benefit counseling provided? Not needed    Acute Infection Status:    Acute infections noted within Epic:  No active infections    Patient reported infection: None    Therapy Appropriateness:    Is therapy appropriate and patient progressing towards therapeutic goals? Yes, therapy is appropriate and should be continued    DISEASE/MEDICATION-SPECIFIC INFORMATION      N/A    Is the patient receiving adequate infection prevention treatment? Not applicable    Does the patient have adequate nutritional support? Not applicable    PATIENT SPECIFIC NEEDS     Does the patient have any physical, cognitive, or cultural barriers? No    Is the patient high risk? No    Did the patient require a clinical intervention? No    Does the patient require physician intervention or other additional services (i.e., nutrition, smoking cessation, social work)? No    SOCIAL DETERMINANTS OF HEALTH     At the [facility], we have learned that life circumstances - like trouble affording food, housing, utilities, or transportation can affect the health of many of our patients.   That is why we wanted to ask: are you currently experiencing any life circumstances that are negatively impacting your health and/or quality of life? Patient declined to answer    Social Determinants of Health     Financial Resource Strain: Low Risk  (07/04/2022)    Overall Financial Resource Strain (CARDIA)     Difficulty of Paying Living Expenses: Not hard at all   Internet Connectivity: Not on file   Food Insecurity: No Food Insecurity (07/04/2022)    Hunger Vital Sign     Worried About Running Out of Food in the Last Year: Never true     Ran Out of Food in the Last Year: Never true   Tobacco Use: High Risk (09/06/2022)    Patient History     Smoking Tobacco Use: Every Day     Smokeless Tobacco Use: Former     Passive Exposure: Current   Housing/Utilities: Low Risk  (07/04/2022)    Housing/Utilities     Within the past 12 months, have you ever stayed: outside, in a car, in a tent, in an overnight shelter, or temporarily in someone else's home (i.e. couch-surfing)?: No     Are you worried about losing your housing?: No     Within the past 12 months, have you been unable to get utilities (heat, electricity) when it was really needed?: No   Alcohol Use: Not on file   Transportation  Needs: No Transportation Needs (07/04/2022)    PRAPARE - Therapist, art (Medical): No     Lack of Transportation (Non-Medical): No   Substance Use: Not on file   Health Literacy: Not on file   Physical Activity: Not on file   Interpersonal Safety: Not on file   Stress: Not on file   Intimate Partner Violence: Unknown (07/07/2021)    Received from [facility]    HITS     Physically Hurt: Not on file     Insult or Talk Down To: Not on file     Threaten Physical Harm: Not on file     Scream or Curse: Not on file   Depression: Not at risk (11/03/2021)    Received from [facility]    PHQ-2     (OBSOLETE) Total Prescreening Score: 1   Social Connections: Unknown (08/09/2021)    Received from [facility]    Social Network     Social Network: Not on file     Would you be willing to receive help with any of the needs that you have identified today? Not applicable       SHIPPING     Specialty Medication(s) to be Shipped:   Hematology/Oncology: Iclusig    Other medication(s) to be shipped: No additional medications requested for fill at this time     Changes to insurance: No    Delivery Scheduled: Yes, Expected medication delivery date: 09/15/22.     Medication will be delivered via UPS to the confirmed prescription address in [facility].    The patient will receive a drug information handout for each medication shipped and additional FDA Medication Guides as required.  Verified that patient has previously received a Conservation officer, historic buildings and a Surveyor, mining.    The patient or caregiver noted above participated in the development of this care plan and knows that they can request review of or adjustments to the care plan at any time.      All of the patient's questions and concerns have been addressed.    Kermit Balo, [facility]   [facility] Specialty Pharmacist

## 2022-09-14 MED FILL — ICLUSIG 30 MG TABLET: ORAL | 30 days supply | Qty: 30 | Fill #2

## 2022-09-15 ENCOUNTER — Telehealth: Admit: 2022-09-15 | Discharge: 2022-09-16 | Payer: MEDICAID | Attending: Hematology | Primary: Hematology

## 2022-09-15 DIAGNOSIS — C9101 Acute lymphoblastic leukemia, in remission: Principal | ICD-10-CM

## 2022-09-15 DIAGNOSIS — R5383 Other fatigue: Principal | ICD-10-CM

## 2022-09-15 NOTE — Unmapped (Signed)
Pt confirmed PHONE visit and meds

## 2022-09-15 NOTE — Unmapped (Signed)
[facility]    Patient Name: Adam Keith  Patient Age: 42 y.o.  Encounter Date: 09/15/2022    Primary Care Provider:  Dierdre Harness, MD    Referring Physician:  Dierdre Harness, MD  [address]  [place],  [place] [address]    Reason for visit:  Ph+ B-ALL follow up     Assessment:  A 42 y.o. 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 The course was complicated by septic shock, candida krusei fungemia, MRSA bacteremia with septic emboli c/b acute renal failure and respiratory distress requiring dialysis and intubation.  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  He subsequently had difficulty tolerating single agent dasatinib (as a bridge to planned ponatinib-blinatumomab--had fluid retention and pleural effusion).  He then completed 4 cycles of ponatinib-blinatumomab, initiated for treatment of MRD in the setting of poor tolerance of standard therapy.      He is currently on ponatinib 30 mg monotherapy (since 01/2021).  When we decreased his ponatinib to 15 mg, he had a rise in transcripts. Most recent BCR-ABL is + at 0.002.     He continues to feel poorly, although with no new symptoms. Fatigue most bothersome. I discussed that I would expand the workup for fatigue as below. Some clear steps are to get treatment for OSA, depression.     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  - Continue aspirin while on ponatinib (also on apixaban)   - BCR-ABL q 3 months on peripheral blood - next 11/30/22  - Declined BMT for now Fatigue, malaise: Unclear driving etiology. Could be related to known depression, COPD, OSA unable to tolerate CPAP, Vit D deficiency, prior infections. 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   - No plan for further IVIG given several reactions   - Rec Flu and Covid vaccines     DRESS: followed by Dr. 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 Now s/p dialysis  - stable Cr appears to have developed CKD due to incompletely healed acute injury.  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 CT chest 02/06/22 with residual subacute thrombus.   - Indefinite anticoagulation with apixaban (in addition to asa given ponatinib)     Vitamin D Deficiency: was low 07/2022.   - started supplementation 07/2022.   - He has not noticed any change in symptoms with it. Will recheck level in 1 month.     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 [name] and Dr. Verlan Friends and Dr. Maple Mirza following patient.    - long term doxycycline with goal of decolonization  **Hx Candidemia during induction - now s/p Cresemba  - followed by Dr. [name]  **Hx Covid-19 x 4 documented   - possible long-Covid symptoms    Symptom management: bowel regimen as listed in medication list, analgesics as listed in medication list or symptoms reviewed and due to acceptable control, no changes made  **Peripheral neuropathy, sensory and motor  - Grade 1-2 monitor  **Chronic Back Pain, complicated by hospital stay and procedures  - Followed by Dr. Genice Rouge, Palliative Care  **Depression/anxiety: following with [name], [facility]  - management per psychiatry     Venous access: I recommend the following change for venous access : PICC line removal      Supportive Care Recommendations:  We recommend based on the patient???s underlying diagnosis and treatment history the following supportive care:    1. Antimicrobial prophylaxis:   - Primary management per ICID  - On daily doxy for recurrent MRSA pna    - Viral - valacyclovir 500mg  daily  - Bacterial - levofloxacin 500 mg if ANC <0.5  - Fungal - previously on treatment isavuconazole - off now  - PJP - On Bactrim DS BID Sat/Sun  - recommend Annual flu shot - declines thus far  - recommend updated Covid vaccine - declines    2. Blood product support:  I recommend the following intervals for laboratory monitoring to determine transfusion needs and monitor for hematologic effects of therapy and underlying disease: no routine monitoring outside of clinic follow-up    Leukoreduced blood products are required.  Irradiated blood products are preferred, but in case of urgent transfusion needs non-irradiated blood products may be used:     -  RBC transfusion threshold: transfuse 2 units for Hgb < 8 g/dL.  -  Platelet transfusion threshold: transfuse 1 unit of platelets for platelet count < 10, or for bleeding or need for invasive procedure.    3. Hematopoietic growth factor support: none    Care coordination: The next [facility] Follow up requested:  - 3 month followup    Mariel Aloe, MD   Leukemia Program  Division of Hematology  [facility]     Nurse Navigator (non-clinical trial patients): Elicia Lamp, RN Tel. [phone]       Fax. [phone]  Toll-free appointments: [phone]  Scheduling assistance: [phone]  After hours/weekends: [phone] (ask for adult hematology/oncology on-call)            History of Present Illness:  We had the pleasure of seeing Adam Keith in the Leukemia Clinic at the [facility] [place] on 09/15/2022.  He is a 42 y.o. male with  Ph+ B-ALL .        His oncologic history is as follows:      Hematology/Oncology History Overview Note   Referring/Local Oncologist: None    Diagnosis:Ph+ ALL    Genetics:    Karyotype/FISH:Abnormal Karyotype: 46,XY,t(9;22)(q34;q11.2)[1]/45,XY,der(7;9)(q10;q10)t(9;22)(q34;q11.2),der(22)t(9;22)[11]/46,sdl,+der(22)t(9;22)[5]/46,XY[3]     Abnormal FISH: A BCR/ABL1 interphase FISH assay shows an abnormal signal pattern in 97% of the 100  cells scored. 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  The findings support a diagnosis of leukemia and have implications for targeted therapy and for monitoring residual disease.      Molecular Genetics:  BCR-ABL1 p210 transcripts were detected at a level of 46.479 IS% ratio in bone marrow.  BCR-ABL1 p190 transcripts were detected at a level of 4 in 100,000 cells in bone marrow.    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)     01/24/2020 - 04/02/2020 Chemotherapy    IP/OP LEUKEMIA GRAAPH-2005 + RITUXIMAB < 60 YO  rituximab hypercvad (odd and even course)     02/24/2020 Remission    CR with PCR MRD+; marrow showing 70% blasts with <1% blasts, negative flow MRD, BCR-ABL p210 PCR 0.197%; CNS negative for blasts     03/09/2020 Progression    CSF - rare blast ID'ed - IT chemo given    03/13/20 - IT chemo, CSF negative     04/16/2020 Biopsy    BM Bx with 40-50% cellularity, <1% blasts, MRD flow cytometry negative, BCR-ABL p210 transcripts 0.036%     05/28/2020 - 05/28/2020 Chemotherapy    OP AML - CNS THERAPY (INTRATHECAL CYTARABINE, INTRATHECAL METHOTREXATE, OR INTRATHECAL TRIPLE)  Select one of the following: cytarabine IT 100 mg with hydrocortisone 50 mg, cytarabine IT 40 mg with methotrexate 15 mg with hydrocortisone 50 mg, OR methotrexate IT 12 mg with hydrocortisone 50 mg     06/19/2020 -  Chemotherapy    IP/OP LEUKEMIA BLINATUMOMAB 7-DAY INFUSION (MINIMAL RESIDUAL DISEASE; WT >= 22 KG) (HOME INFUSION)      Cycles 1*-4: Blinatumomab 28 mcg/day Days 1-28 of 6-week cycle.  *Given in the inpatient setting on Days 1-3 on Cycle 1 and Days 1-2 on Cycle 2, while other treatment days are given in the outpatient setting.    Cycle 1 MRD Dosing     07/18/2020 Adverse Reaction    Hospitalization: fevers. Pneumonia     07/23/2020 Biopsy    Diagnosis  Bone marrow, right iliac, aspiration and biopsy  -   Normocellular bone marrow (50%) with trilineage hematopoiesis and 1% blasts by manual aspirate differential  -   Flow cytometry MRD analysis reveals no definitive immunophenotypic evidence of residual B lymphoblastic leukemia   - BCR-ABL p210 0.006%         08/10/2020 -  Chemotherapy    Cycle 2 blinatumomab-ponatinib  Ponatinib 30mg     1 IT per cycle     09/21/2020 Adverse Reaction    Covid-19, symptomatic but not requiring hospitalization.     10/06/2020 -  Chemotherapy    Cycle 3 blinatumomab-ponatinib  Ponatinib 30mg        11/01/2020 Adverse Reaction    Hospitalization: MRSA Pneumonia requiring intubation    Blinatumomab stopped.  Ponatinib held until he stabilized.       12/14/2020 -  Chemotherapy    Cycle 4 blinatumomab 28 mcg/day days 1-28, ponatinib 30 mg per day IT triple therapy day 29     01/13/2021 -  Chemotherapy    Ponatinib 30mg  daily  - due to due low level BCR-ABL     03/06/2021 Adverse Reaction  Hospitalization - ICU on ventilator - influenza and MRSA/klebsiella PNA     06/13/2022 -  Chemotherapy    OP LEUKEMIA VINCRISTINE  vinCRIStine 2 mg IV on day 1     ALL (acute lymphoblastic leukemia) (CMS-HCC)   06/11/2020 -  Chemotherapy    IP/OP LEUKEMIA BLINATUMOMAB 7-DAY INFUSION (MINIMAL RESIDUAL DISEASE; WT >= 22 KG) (HOME INFUSION)  Cycles 1*-4: Blinatumomab 28 mcg/day Days 1-28 of 6-week cycle.  *Given in the inpatient setting on Days 1-3 on Cycle 1 and Days 1-2 on Cycle 2, while other treatment days are given in the outpatient setting.     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  *Given in the inpatient setting on Days 1-3 on Cycle 1 and Days 1-2 on Cycle 2, while other treatment days are given in the outpatient setting.     11/09/2020 Initial Diagnosis    Acute lymphoblastic leukemia in remission (CMS-HCC)     06/13/2022 -  Chemotherapy    OP LEUKEMIA VINCRISTINE  vinCRIStine 2 mg IV on day 1         Interim History:  Still not feeling well. Sleeping a lot. No energy. Coughing a lot. Using inhalers helping when he remembers. No benefit yet from increased anti-depression meds. No fevers or infectious symptoms other than cough.     Past Medical, Surgical and Family History were reviewed and pertinent updates were made in the Electronic Medical Record      ECOG Performance Status: 1    Medications:      Current Outpatient Medications   Medication Sig Dispense Refill    albuterol HFA 90 mcg/actuation inhaler Inhale 2 puffs every six (6) hours as needed for wheezing. 18 g 0    amLODIPine (NORVASC) 10 MG tablet Take 1 tablet (10 mg total) by mouth daily. 30 tablet 0    apixaban (ELIQUIS) 5 mg Tab Take 1 tablet (5 mg total) by mouth two (2) times a day. 60 tablet 6    arm brace (WRIST BRACE) Misc 1 Piece by Miscellaneous route as needed. left ulnar wrist brace 1 each 0    carvediloL (COREG) 12.5 MG tablet Take 1 tablet (12.5 mg total) by mouth Two (2) times a day. 180 tablet 1    docusate sodium (COLACE) 100 MG capsule Take 1 capsule (100 mg total) by mouth daily.      doxycycline (VIBRA-TABS) 100 MG tablet Take 1 tablet (100 mg total) by mouth two (2) times a day. 180 tablet 3    ergocalciferol-1,250 mcg, 50,000 unit, (DRISDOL) 1,250 mcg (50,000 unit) capsule Take 1 capsule (1,250 mcg total) by mouth once a week. 8 capsule 0    hydroCHLOROthiazide (HYDRODIURIL) 12.5 MG tablet Take 1 tablet (12.5 mg total) by mouth daily. 30 tablet 11    mirtazapine (REMERON) 45 MG tablet Take 1 tablet (45 mg total) by mouth nightly. 30 tablet 11    naloxone (NARCAN) 4 mg nasal spray One spray in either nostril once for known/suspected opioid overdose. May repeat every 2-3 minutes in alternating nostril til EMS arrives 2 each 0  nortriptyline (PAMELOR) 50 MG capsule TAKE 2 CAPSULES BY MOUTH EVERY DAY AT NIGHT 180 capsule 4    omeprazole (PRILOSEC) 40 MG capsule Take 1 capsule (40 mg total) by mouth every morning.      ondansetron (ZOFRAN-ODT) 4 MG disintegrating tablet Take 1 tablet (4 mg total) by mouth every eight (8) hours as needed for nausea.      oxyCODONE (ROXICODONE) 10 mg immediate release tablet Take 1 tablet (10 mg total) by mouth every four (4) hours as needed for pain. 120 tablet 0    PONATinib (ICLUSIG) 30 mg tablet Take 1 tablet (30 mg total) by mouth daily. Swallow tablets whole. Do not crush, break, cut or chew tablets. 30 tablet 5    spironolactone (ALDACTONE) 25 MG tablet Take 1 tablet (25 mg total) by mouth daily. 90 tablet 3    sulfamethoxazole-trimethoprim (BACTRIM DS) 800-160 mg per tablet Take 1 tablet (160 mg of trimethoprim total) by mouth two (2) times a day. TAKE 1 TABLET BY MOUTH 2 TIMES A DAY ON SATURDAY, SUNDAY. FOR PROPHYLAXIS 48 tablet 3    tiotropium-olodaterol (STIOLTO RESPIMAT) 2.5-2.5 mcg/actuation Mist Inhale 2 puffs daily. 4 g 3    tiotropium-olodaterol (STIOLTO RESPIMAT) 2.5-2.5 mcg/actuation Mist Inhale 2 puffs daily. 4 g 0    tiotropium-olodaterol (STIOLTO RESPIMAT) 2.5-2.5 mcg/actuation Mist Inhale 2 puffs daily. 4 g 0    valACYclovir (VALTREX) 500 MG tablet Take 1 tablet (500 mg total) by mouth daily. 90 tablet 3     No current facility-administered medications for this visit.       Vital Signs:  There were no vitals filed for this visit. - video visit    GENERAL: Well-appearing white man. NAD but depressed. Alone.   HEENT: clear sclera  HEART: Normal color, not excessive pallor. Not ashen.   CHEST/LUNG: Normal work of breathing. No dyspnea with conversation.   EXTREMITIES: Not evaluated.   SKIN: No noticable rash or petechiae.   NEURO EXAM: Grossly intact.     MDM:  1 chronic life threatening illness  Drug therapy requiring intense monitoring for toxicity       The patient reports they are physically located in [place] and is currently: at home. I conducted a audio/video visit. I spent  60m 12s on the video call with the patient. I spent an additional 10 minutes on pre- and post-visit activities on the date of service .

## 2022-09-27 NOTE — Unmapped (Unsigned)
SUBJECTIVE:     Chief Complaint: Congestion    HPI: Adam Keith is a 43 y.o. male who presents today for consultation at the request of Marvetta Gibbons* for evaluation of chronic sinusitis.     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    Patient reports some nasal drainage and congestion but denies facial pain/pressure and sense of smell is stable. 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 He is currently on doxycyline that he has been taking since October 2021.     Nasal Congestion: bilateral   Drainage: endorses  Facial pain/pressure: mild  Sense of smell: somewhat diminished   Other associated symptoms: denies  Hx allergy testing: denies  Prior surgeries or trauma: none  Prior Imaging: CT today   Recent antibiotics or steroids: as above  Prior therapies: antibiotics   Current regimen:antibiotics   Hx asthma: COPD; is currently a smoker and trying to quit   OSA: denies   GERD: denies  Aspirin sensitivity: denies  Autoimmunity: denies       CT today reveals partial opacification of left maxillary sinus, scattered ethmoid disease. Anterior right septal deviation and posterior left septal deviation.     Update 09/29/2022:  FU Chronic rhinitis, CRS, ALL, Current smoker.   Was seen by Dr. Ronnald Ramp on 06/15/2022 for sensorineural hearing loss and was started on tapered dose of prednisone.    Was seen by Dr. 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 He can continue taking Prednisone as prescribed. Also recommend applying 1-2 drops of mineral oil to his ears 3-4 times per week to manage cerumen buildup. He will begin using Flonase daily for aural fullness.     Has a history of COPD managed with Stiolto and Albuterol as needed and follows up with Pulmonology.   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    Past Surgical History  His  has a past surgical history that includes Bone Marrow Biopsy & Aspiration (01/21/2020); IR Insert Port Age Greater Than 5 Years (03/10/2020); pr bronchoscopy,diagnostic w lavage (Bilateral, 07/20/2020); and chg [place], chest,real time (11/20/2020).    Past Family History  His family history includes Cancer in his maternal grandfather and mother; No Known Problems in his brother, father, maternal aunt, maternal grandmother, maternal uncle, paternal aunt, paternal grandfather, paternal grandmother, paternal uncle, and sister.    Past Social History  He  reports that he has been smoking cigarettes. He has been exposed to tobacco smoke. He has quit using smokeless tobacco.  His smokeless tobacco use included chew. He reports that he does not currently use alcohol. He reports that he does not use drugs.    Medications/Allergies/Immunizations  His current medication(s) include:    Current Outpatient Medications:     albuterol HFA 90 mcg/actuation inhaler, Inhale 2 puffs every six (6) hours as needed for wheezing., Disp: 18 g, Rfl: 0    amLODIPine (NORVASC) 10 MG tablet, Take 1 tablet (10 mg total) by mouth  daily., Disp: 30 tablet, Rfl: 0    apixaban (ELIQUIS) 5 mg Tab, Take 1 tablet (5 mg total) by mouth two (2) times a day., Disp: 60 tablet, Rfl: 6    arm brace (WRIST BRACE) Misc, 1 Piece by Miscellaneous route as needed. left ulnar wrist brace, Disp: 1 each, Rfl: 0    carvediloL (COREG) 12.5 MG tablet, Take 1 tablet (12.5 mg total) by mouth Two (2) times a day., Disp: 180 tablet, Rfl: 1    docusate sodium (COLACE) 100 MG capsule, Take 1 capsule (100 mg total) by mouth daily., Disp: , Rfl:     doxycycline (VIBRA-TABS) 100 MG tablet, Take 1 tablet (100 mg total) by mouth two (2) times a day., Disp: 180 tablet, Rfl: 3    hydroCHLOROthiazide (HYDRODIURIL) 12.5 MG tablet, Take 1 tablet (12.5 mg total) by mouth daily., Disp: 30 tablet, Rfl: 11    mirtazapine (REMERON) 45 MG tablet, Take 1 tablet (45 mg total) by mouth nightly., Disp: 30 tablet, Rfl: 11    naloxone (NARCAN) 4 mg nasal spray, One spray in either nostril once for known/suspected opioid overdose. May repeat every 2-3 minutes in alternating nostril til EMS arrives, Disp: 2 each, Rfl: 0    nortriptyline (PAMELOR) 50 MG capsule, TAKE 2 CAPSULES BY MOUTH EVERY DAY AT NIGHT, Disp: 180 capsule, Rfl: 4    omeprazole (PRILOSEC) 40 MG capsule, Take 1 capsule (40 mg total) by mouth every morning., Disp: , Rfl:     ondansetron (ZOFRAN-ODT) 4 MG disintegrating tablet, Take 1 tablet (4 mg total) by mouth every eight (8) hours as needed for nausea., Disp: , Rfl:     oxyCODONE (ROXICODONE) 10 mg immediate release tablet, Take 1 tablet (10 mg total) by mouth every four (4) hours as needed for pain., Disp: 120 tablet, Rfl: 0    PONATinib (ICLUSIG) 30 mg tablet, Take 1 tablet (30 mg total) by mouth daily. Swallow tablets whole. Do not crush, break, cut or chew tablets., Disp: 30 tablet, Rfl: 5    spironolactone (ALDACTONE) 25 MG tablet, Take 1 tablet (25 mg total) by mouth daily., Disp: 90 tablet, Rfl: 3    sulfamethoxazole-trimethoprim (BACTRIM DS) 800-160 mg per tablet, Take 1 tablet (160 mg of trimethoprim total) by mouth two (2) times a day. TAKE 1 TABLET BY MOUTH 2 TIMES A DAY ON SATURDAY, SUNDAY. FOR PROPHYLAXIS, Disp: 48 tablet, Rfl: 3    tiotropium-olodaterol (STIOLTO RESPIMAT) 2.5-2.5 mcg/actuation Mist, Inhale 2 puffs daily., Disp: 4 g, Rfl: 3    tiotropium-olodaterol (STIOLTO RESPIMAT) 2.5-2.5 mcg/actuation Mist, Inhale 2 puffs daily., Disp: 4 g, Rfl: 0    tiotropium-olodaterol (STIOLTO RESPIMAT) 2.5-2.5 mcg/actuation Mist, Inhale 2 puffs daily., Disp: 4 g, Rfl: 0    valACYclovir (VALTREX) 500 MG tablet, Take 1 tablet (500 mg total) by mouth daily., Disp: 90 tablet, Rfl: 3  Allergies: Bupropion hcl, Cefepime, Ceftaroline fosamil, Dapsone, Onion, Vancomycin analogues, Bismuth subsalicylate, Privigen [immun glob g(igg)-pro-iga 0-50], Furosemide, and Gammagard,  Immunizations:   Immunization History   Administered Date(s) Administered    COVID-19 VACCINE,MRNA(MODERNA)(PF) 02/25/2020, 03/23/2020          Review of Systems:     Her SNOT-22 score was *** obtained today, and is documented in the chart.     PHYSICAL EXAM  GENERAL APPEARANCE:  Well developed, well nourished.    HEAD AND FACE: No lesions or masses.  Palpation and percussion of face shows no tenderness over the sinuses.  Salivary glands show no masses.  ENT  Ears: External inspection of ears reveals no lesions, no masses. Pneumatic otoscopic examination reveals normal tympanic membranes with normal motion and normal external auditory canals bilaterally.    Nose:  Visualization of the middle meatus was limited on anterior evaluation. External inspection of nose reveals no lesions, no masses.   Oral cavity/Oropharynx: Lips and gums are normal.  Oropharynx, including the mucosa of oral cavity, hard and soft palates, tongue, and posterior pharyngeal wall showed normal symmetry without lesion and normal hydration of mucosal surfaces.  Large symmetric tonsils.   Neck:  Normal symmetry and overall appearance as well as tracheal position; no masses. Thyroid gland shows no tenderness or masses.    Diagnostic Bilateral Flexible Fiberoptic Nasal Endoscopy   Surgeon:  B. Senior, MD  Anesthesia:  1% Lidocaine  Procedure Detail:  As a result of inability to visualize the intranasal anatomy, and after achieving adequate topical anesthesia and understanding the potential risks related to the procedure (primarily bleeding), a flexible fiberoptic endoscope is used to examine the left and right sinonasal cavities, including the interior of the nasal cavity and the middle and superior meatus, the turbinates, and the spheno-ethmoid recess. All these areas were inspected.    Findings:  Left: Posterior septal deviation. IT reduces nicely. MM and SER without purulence, pus or polyps.   Right: Anterior septal deviation. IT reduces nicely. MM and SER without purulence, pus or polyps.       Oretha Ellis Nasal Endoscopy Score: [employer] is used to assess the degree of inflammation of the sinonasal structures, including the middle and superior turbinates, the ethmoid sinuses, maxillary sinuses, frontal sinuses, and sphenoid sinuses.  In the presence of previous surgery, some or all of these structures may be absent.    Left        Polyps:  Absent (0)   Edema:   Mild (1)   Discharge:  Clear, Thin (1)    Scarring:  Absent (0)   Crusting:  None (0)      Total Left:  2      Right  Polyps:  Absent (0)   Edema:  Mild (1)   Discharge: Clear, Thin (1)    Scarring:  Absent (0)   Crusting:  None (0)      Total Right:   2             ASSESSMENT  Chronic rhinitis  CRS  ALL  Current smoker       PLAN:  Given the recurrent nature and resistant nature of his infections, I would recommend a septoplasty, bilateral FESS with left medial maxillectomy. I would do this at the main OR with overnight stay. The procedure would be approximately 4 hours with minimal fluid shift. He will discuss with his physician if surgery seems appropriate. He will reach out after his appointment next week with ID.   Encouraged him to stop smoking. Smoking has an adverse effect on sinus health and also negatively impacts the results of surgery.      Follow up based on patient discussion with other physicians. He has been in remission with ALL for several years.           I was present and participated in the entire office visit, examination including the entire endoscopy procedure (if performed).    I briefly reviewed with the patient that based on a new federal law, the patient will be provided with their laboratory, pathology and radiology results once they  are finalized. Meaning, They will likely receive these results before the I have had a chance to review them independently. I reassured them that we will work hard to respond back and counsel them on the results, though I asked that they allow for 5 business days for me to review the results and call them. If they have not heard within this time period, they have our contact information and know to call or message [place] through MyChart.   In addition, my notes will also be available to them and may contain information that has not been thoroughly edited for them.

## 2022-09-29 ENCOUNTER — Ambulatory Visit: Admit: 2022-09-29 | Payer: MEDICAID

## 2022-09-29 DIAGNOSIS — F4310 Post-traumatic stress disorder, unspecified: Principal | ICD-10-CM

## 2022-09-29 DIAGNOSIS — F331 Major depressive disorder, recurrent, moderate: Principal | ICD-10-CM

## 2022-09-29 DIAGNOSIS — F419 Anxiety disorder, unspecified: Principal | ICD-10-CM

## 2022-09-29 MED ORDER — MIRTAZAPINE 45 MG TABLET
ORAL_TABLET | Freq: Every evening | ORAL | 4 refills | 90 days | Status: CP
Start: 2022-09-29 — End: ?

## 2022-10-04 ENCOUNTER — Telehealth: Admit: 2022-10-04 | Discharge: 2022-10-05 | Payer: MEDICAID

## 2022-10-04 DIAGNOSIS — F4310 Post-traumatic stress disorder, unspecified: Principal | ICD-10-CM

## 2022-10-04 DIAGNOSIS — R5383 Other fatigue: Principal | ICD-10-CM

## 2022-10-04 DIAGNOSIS — F419 Anxiety disorder, unspecified: Principal | ICD-10-CM

## 2022-10-04 DIAGNOSIS — F331 Major depressive disorder, recurrent, moderate: Principal | ICD-10-CM

## 2022-10-04 MED ORDER — METHYLPHENIDATE 5 MG TABLET
ORAL_TABLET | Freq: Two times a day (BID) | ORAL | 0 refills | 30 days | Status: CP
Start: 2022-10-04 — End: 2022-11-03

## 2022-10-04 NOTE — Unmapped (Signed)
Comprehensive Cancer Support Program (CCSP) - Psychiatry Outpatient Clinic   After Visit Summary    It was a pleasure to see you today in the [facility] ([facility]). The CCSP is a multidisciplinary program dedicated to helping patients, caregivers, and families with cancer treatment, recovery and survivorship.      To schedule, cancel, or change your appointment:  Please call the [facility] schedulers at [phone], Monday through Friday 8AM - 5PM.  Someone will return your call within 24 hours.      If you have a question about your medicines or you need to contact your provider:  First, try sending a My Chart message to Antavia Tandy, PMHNP. If you are unable to do so, please call the CCSP program coordinator, Devon Suitt, at [phone].     For after hours urgent issues, you may call [phone] or call the "I need to talk" line at 1-800-273-TALK (8255) anytime 24/7.    CCSP Patient and Family Resource Center: [phone].    CCSP Website:  [email or web address]    For prescription refills, please allow at least 24 hours (during business hours, M-F) for providers to call in refills to your pharmacy. We are generally unable to accommodate same-day requests for refills.     If you are taking any controlled substances (such as anxiety or sleep medications), you must use them as the directions say to use them. We generally do not provide early refills over the phone without clear reason, and it would be inappropriate to obtain the medications from other doctors. We routinely use the [place] controlled substance database to monitor prescription drug use.

## 2022-10-04 NOTE — Unmapped (Signed)
[facility]  Psychiatry--Comprehensive Cancer Support Program   Established Patient E&M Service - Outpatient       Assessment:    Adam Keith presents for follow-up evaluation. He states that he continues to struggle with depression and anxiety, but he is finding improvement since increasing his mirtazapine to 45 mg. He continues to report hypersomnia and profound fatigue. He has agreed to a trial of methylphenidate 5 mg BID to help boost his mood and improve his fatigue. Continuing his nortriptyline 100 mg at bedtime with his mirtazapine 45 mg. Will reassess on 11/01/22.    Identifying Information:  Adam Keith is a 42 y.o. male with a history of PH+ B cell ALL, recurrent major depressive disorder, PTSD, anxiety, and insomnia. He has had a long struggle with intermittent depressive episodes with one suicide attempt at age 35.      Risk Assessment:  An assessment of suicide and violence risk factors was performed as part of this evaluation and is not significantly increased from the last visit.   While future psychiatric events cannot be accurately predicted, the patient does not currently require acute inpatient psychiatric care and does not currently meet [facility] involuntary commitment criteria.      Plan:    Problem 1: Major Depressive Disorder  Status of problem: chronic with moderate exacerbation  Interventions:   Continue nortriptyline 100 mg at bedtime.    Continue mirtazapine 45 mg at bedtime   Start methylphenidate 5 mg BID. The risks, benefits, and alternatives to this medication were discussed with the patient who consents to the above plan.  Encouraged psychotherapy with local therapist     Problem 2: Anxiety/PTSD  Status of problem: chronic with moderate exacerbation  Interventions:   See MDD above     Problem 3: Insomnia  Status of problem:  chronic and stable  Interventions:   See mirtazapine plan above      Psychotherapy provided:  No billable psychotherapy service provided.    Patient has been given this writer's contact information as well as the [facility] Psychiatry urgent line number. The patient has been instructed to call 911 for emergencies.      Subjective:    Chief complaint:  Follow-up psychiatric evaluation for depression, anxiety, and PTSD    Interval History:   Some improvement with his depression and anxiety since increasing the mirtazapine from 30 mg to 45 mg. Patient reporting intrusive thoughts, but denies suicidal ideation. Depression now moderate in severity. Profound fatigue remains a problem. Patient finding the fatigue to be contributing to fear of recurrence. Eager to have lab work as recommended by oncology. It's possible that the depression is contributing to the fatigue.     [place] to receive benefits for his PTSD.     Prior Psychiatric Medication Trials: bupropion (could not tolerate), duloxetine, mirtazapine (given during recent hospitalization), lorazepam, clonazepam      Psychiatric Review of Systems  Depressive Symptoms: depressed mood, anhedonia, decreased motivation, feelings of worthlessness, fatigue  Manic Symptoms: N/A  Psychosis Symptoms: N/A  Anxiety Symptoms: excessive anxiety, ruminations, restlessness at times, easily fatigued, muscle tension  Panic Symptoms: denies  PTSD Symptoms: denies  Sleep disturbance: hypersomnia  Exercise: Unable to engage in a daily exercise routine.      Objective:      Mental Status Exam:  Appearance:    Appears stated age, Well nourished, and Clean/Neat   Motor:   No abnormal movements   Speech/Language:  Normal rate, volume, tone, fluency   Mood:    OK   Affect:   Blunted, Calm, and Cooperative   Thought process and Associations:   Logical, linear, clear, coherent, goal directed   Abnormal/psychotic thought content:     Denies SI, HI, self harm, delusions, obsessions, paranoid ideation, or ideas of reference   Perceptual disturbances:     Denies auditory and visual hallucinations, behavior not concerning for response to internal stimuli     Other:   Insight and judgment intact     I personally spent 39 minutes face-to-face and non-face-to-face in the care of this patient, which includes all pre, intra, and post visit time on the date of service.    Visit was completed by video (or phone) and the appropriate disclaimer has been included below.    The patient reports they are physically located in [place] and is currently: at home. I conducted a audio/video visit. I spent  69m 16s on the video call with the patient. I spent an additional 15 minutes on pre- and post-visit activities on the date of service .       Sheran Lawless, PMHNP  10/04/2022

## 2022-10-10 MED ORDER — OXYCODONE 10 MG TABLET
ORAL_TABLET | ORAL | 0 refills | 20 days | Status: CP | PRN
Start: 2022-10-10 — End: ?

## 2022-10-11 MED ORDER — OXYCODONE 10 MG TABLET
ORAL_TABLET | ORAL | 0 refills | 20 days | Status: CP | PRN
Start: 2022-10-11 — End: ?

## 2022-10-12 NOTE — Unmapped (Signed)
Addended by: Marita Kansas on: 10/11/2022 05:24 PM     Modules accepted: Orders

## 2022-10-17 ENCOUNTER — Ambulatory Visit: Admit: 2022-10-17 | Discharge: 2022-10-17 | Payer: MEDICAID | Attending: Adult Health | Primary: Adult Health

## 2022-10-17 ENCOUNTER — Other Ambulatory Visit: Admit: 2022-10-17 | Discharge: 2022-10-17 | Payer: MEDICAID

## 2022-10-17 DIAGNOSIS — C9101 Acute lymphoblastic leukemia, in remission: Principal | ICD-10-CM

## 2022-10-17 DIAGNOSIS — E559 Vitamin D deficiency, unspecified: Principal | ICD-10-CM

## 2022-10-17 DIAGNOSIS — C9100 Acute lymphoblastic leukemia not having achieved remission: Principal | ICD-10-CM

## 2022-10-17 DIAGNOSIS — R5383 Other fatigue: Principal | ICD-10-CM

## 2022-10-17 LAB — COMPREHENSIVE METABOLIC PANEL
ALBUMIN: 4.1 g/dL (ref 3.4–5.0)
ALKALINE PHOSPHATASE: 94 U/L (ref 46–116)
ALT (SGPT): 37 U/L (ref 10–49)
ANION GAP: 4 mmol/L — ABNORMAL LOW (ref 5–14)
AST (SGOT): 22 U/L (ref <=34)
BILIRUBIN TOTAL: 0.7 mg/dL (ref 0.3–1.2)
BLOOD UREA NITROGEN: 16 mg/dL (ref 9–23)
BUN / CREAT RATIO: 8
CALCIUM: 9.7 mg/dL (ref 8.7–10.4)
CHLORIDE: 106 mmol/L (ref 98–107)
CO2: 29 mmol/L (ref 20.0–31.0)
CREATININE: 2.01 mg/dL — ABNORMAL HIGH
EGFR CKD-EPI (2021) MALE: 41 mL/min/{1.73_m2} — ABNORMAL LOW (ref >=60)
GLUCOSE RANDOM: 106 mg/dL (ref 70–179)
POTASSIUM: 4.4 mmol/L (ref 3.4–4.8)
PROTEIN TOTAL: 6.8 g/dL (ref 5.7–8.2)
SODIUM: 139 mmol/L (ref 135–145)

## 2022-10-17 LAB — CBC W/ AUTO DIFF
BASOPHILS ABSOLUTE COUNT: 0.1 10*9/L (ref 0.0–0.1)
BASOPHILS RELATIVE PERCENT: 1.2 %
EOSINOPHILS ABSOLUTE COUNT: 0.3 10*9/L (ref 0.0–0.5)
EOSINOPHILS RELATIVE PERCENT: 2.7 %
HEMATOCRIT: 48.9 % — ABNORMAL HIGH (ref 39.0–48.0)
HEMOGLOBIN: 17.1 g/dL — ABNORMAL HIGH (ref 12.9–16.5)
LYMPHOCYTES ABSOLUTE COUNT: 2.5 10*9/L (ref 1.1–3.6)
LYMPHOCYTES RELATIVE PERCENT: 22.5 %
MEAN CORPUSCULAR HEMOGLOBIN CONC: 35 g/dL (ref 32.0–36.0)
MEAN CORPUSCULAR HEMOGLOBIN: 29.7 pg (ref 25.9–32.4)
MEAN CORPUSCULAR VOLUME: 84.7 fL (ref 77.6–95.7)
MEAN PLATELET VOLUME: 8.9 fL (ref 6.8–10.7)
MONOCYTES ABSOLUTE COUNT: 0.7 10*9/L (ref 0.3–0.8)
MONOCYTES RELATIVE PERCENT: 6.2 %
NEUTROPHILS ABSOLUTE COUNT: 7.5 10*9/L (ref 1.8–7.8)
NEUTROPHILS RELATIVE PERCENT: 67.4 %
PLATELET COUNT: 193 10*9/L (ref 150–450)
RED BLOOD CELL COUNT: 5.77 10*12/L — ABNORMAL HIGH (ref 4.26–5.60)
RED CELL DISTRIBUTION WIDTH: 15.9 % — ABNORMAL HIGH (ref 12.2–15.2)
WBC ADJUSTED: 11.1 10*9/L (ref 3.6–11.2)

## 2022-10-17 LAB — SEDIMENTATION RATE: ERYTHROCYTE SEDIMENTATION RATE: 13 mm/h (ref 0–15)

## 2022-10-17 LAB — RHEUMATOID FACTOR, QUANT: RHEUMATOID FACTOR: <3.5 [IU]/mL (ref <14.0)

## 2022-10-17 LAB — TSH: THYROID STIMULATING HORMONE: 3.436 u[IU]/mL (ref 0.550–4.780)

## 2022-10-17 LAB — SLIDE REVIEW

## 2022-10-17 LAB — T4, FREE: FREE T4: 1.04 ng/dL (ref 0.89–1.76)

## 2022-10-17 LAB — T3, FREE: T3 FREE: 2.92 pg/mL (ref 2.30–4.20)

## 2022-10-17 LAB — CORTISOL: CORTISOL TOTAL: 12.1 ug/dL

## 2022-10-17 LAB — C-REACTIVE PROTEIN: C-REACTIVE PROTEIN: 21 mg/L — ABNORMAL HIGH (ref <=10.0)

## 2022-10-17 NOTE — Unmapped (Signed)
[facility]    Patient Name: Adam Keith  Patient Age: 42 y.o.  Encounter Date: 10/17/2022    Primary Care Provider:  Dierdre Harness, MD    Referring Physician:  Referring, None Per Patient  [address] [place],  [place] [address]    Reason for visit:  Ph+ B-ALL follow up     Assessment:  A 41 y.o. 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 The course was complicated by septic shock, candida krusei fungemia, MRSA bacteremia with septic emboli c/b acute renal failure and respiratory distress requiring dialysis and intubation.  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  He subsequently had difficulty tolerating single agent dasatinib (as a bridge to planned ponatinib-blinatumomab--had fluid retention and pleural effusion).  He then completed 4 cycles of ponatinib-blinatumomab, initiated for treatment of MRD in the setting of poor tolerance of standard therapy.      He is currently on ponatinib 30 mg monotherapy (since 01/2021).  When we decreased his ponatinib to 15 mg, he had a rise in transcripts. Most recent BCR-ABL is + at 0.002. Patient feels strongly that he would want to increase the ponatinib if BCR/ABL remains positive/rising.    New acute issue is low back pain that radiates down both leg, which would not be typical of sciatic pain. Currently no symptoms concerning for cauda equina.  Etiology is unclear, - there was no trauma or known strains/etc that caused the pain, but it is possible there was an unknown/unintentiional strain. However given his history of B-ALL there is concern for extramedulllary disease involving the low back/spine. Will obtain Lumbar Spine imaging ASAP to evaluate.    Regarding his overall malaise, Dr. Senaida Ores has ordered a broad set of tests to evaluate possible causes. Will follow the results.    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  - Continue aspirin while on ponatinib (also on apixaban)   - BCR-ABL q 3 months on peripheral blood - next 11/30/22  - Declined BMT for now     Fatigue, malaise: Unclear driving etiology. Could be related to known depression, COPD, OSA unable to tolerate CPAP, Vit D deficiency, prior infections. 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   - No plan for further IVIG given several reactions   - Rec Flu and Covid vaccines     DRESS: followed by Dr. 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 Now s/p dialysis  - stable Cr appears to have developed CKD due to incompletely healed acute injury.  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 CT chest 02/06/22 with residual subacute thrombus.   -  Indefinite anticoagulation with apixaban (in addition to asa given ponatinib)     Vitamin D Deficiency: was low 07/2022.   - started supplementation 07/2022.   - He has not noticed any change in symptoms with it. Will recheck level in 1 month.     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 [name] and Dr. Verlan Friends and Dr. Maple Mirza following patient.    - long term doxycycline with goal of decolonization  **Hx Candidemia during induction - now s/p Cresemba  - followed by Dr. [name]  **Hx Covid-19 x 4 documented   - possible long-Covid symptoms    Symptom management: bowel regimen as listed in medication list, analgesics as listed in medication list or symptoms reviewed and due to acceptable control, no changes made  **Peripheral neuropathy, sensory and motor  - Grade 1-2 monitor  **Chronic Back Pain, complicated by hospital stay and procedures  - Followed by Dr. Genice Rouge, Palliative Care  **Depression/anxiety: following with [name], [facility]  - management per psychiatry     Venous access: I recommend the following change for venous access : PICC line removal      Supportive Care Recommendations:  We recommend based on the patient???s underlying diagnosis and treatment history the following supportive care:    1. Antimicrobial prophylaxis:   - Primary management per ICID  - On daily doxy for recurrent MRSA pna    - Viral - valacyclovir 500mg  daily  - Bacterial - levofloxacin 500 mg if ANC <0.5  - Fungal - previously on treatment isavuconazole - off now  - PJP - On Bactrim DS BID Sat/Sun  - recommend Annual flu shot - declines thus far  - recommend updated Covid vaccine - declines    2. Blood product support:  I recommend the following intervals for laboratory monitoring to determine transfusion needs and monitor for hematologic effects of therapy and underlying disease: no routine monitoring outside of clinic follow-up    Leukoreduced blood products are required.  Irradiated blood products are preferred, but in case of urgent transfusion needs non-irradiated blood products may be used:     -  RBC transfusion threshold: transfuse 2 units for Hgb < 8 g/dL.  -  Platelet transfusion threshold: transfuse 1 unit of platelets for platelet count < 10, or for bleeding or need for invasive procedure.    3. Hematopoietic growth factor support: none    Care coordination: The next [facility] Follow up requested:  - MRI imaging as detailed above  - follow up lab results  - currently RTC in September. Will adjust PRN    [name], AGPCNP-BC  Nurse Practitioner  Hematology/Oncology  [facility]      Mariel Aloe, MD was available.  Leukemia Program  Division of Hematology  [facility]     Nurse Navigator (non-clinical trial patients): Elicia Lamp, RN        Tel. [phone]       Fax. [phone]  Toll-free appointments: [phone]  Scheduling assistance: [phone]  After hours/weekends: [phone] (  ask for adult hematology/oncology on-call)            History of Present Illness:  We had the pleasure of seeing Michae Grimley in the Leukemia Clinic at the [facility] [place] on 10/17/2022.  He is a 42 y.o. male with  Ph+ B-ALL .        His oncologic history is as follows:      Hematology/Oncology History Overview Note   Referring/Local Oncologist: None    Diagnosis:Ph+ ALL    Genetics:    Karyotype/FISH:Abnormal Karyotype: 46,XY,t(9;22)(q34;q11.2)[1]/45,XY,der(7;9)(q10;q10)t(9;22)(q34;q11.2),der(22)t(9;22)[11]/46,sdl,+der(22)t(9;22)[5]/46,XY[3]     Abnormal FISH: A BCR/ABL1 interphase FISH assay shows an abnormal signal pattern in 97% of the 100 cells scored. 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  The findings support a diagnosis of leukemia and have implications for targeted therapy and for monitoring residual disease.      Molecular Genetics:  BCR-ABL1 p210 transcripts were detected at a level of 46.479 IS% ratio in bone marrow.  BCR-ABL1 p190 transcripts were detected at a level of 4 in 100,000 cells in bone marrow.    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)     01/24/2020 - 04/02/2020 Chemotherapy    IP/OP LEUKEMIA GRAAPH-2005 + RITUXIMAB < 60 YO  rituximab hypercvad (odd and even course)     02/24/2020 Remission    CR with PCR MRD+; marrow showing 70% blasts with <1% blasts, negative flow MRD, BCR-ABL p210 PCR 0.197%; CNS negative for blasts     03/09/2020 Progression    CSF - rare blast ID'ed - IT chemo given    03/13/20 - IT chemo, CSF negative     04/16/2020 Biopsy    BM Bx with 40-50% cellularity, <1% blasts, MRD flow cytometry negative, BCR-ABL p210 transcripts 0.036%     05/28/2020 - 05/28/2020 Chemotherapy    OP AML - CNS THERAPY (INTRATHECAL CYTARABINE, INTRATHECAL METHOTREXATE, OR INTRATHECAL TRIPLE)  Select one of the following: cytarabine IT 100 mg with hydrocortisone 50 mg, cytarabine IT 40 mg with methotrexate 15 mg with hydrocortisone 50 mg, OR methotrexate IT 12 mg with hydrocortisone 50 mg     06/19/2020 -  Chemotherapy    IP/OP LEUKEMIA BLINATUMOMAB 7-DAY INFUSION (MINIMAL RESIDUAL DISEASE; WT >= 22 KG) (HOME INFUSION)      Cycles 1*-4: Blinatumomab 28 mcg/day Days 1-28 of 6-week cycle.  *Given in the inpatient setting on Days 1-3 on Cycle 1 and Days 1-2 on Cycle 2, while other treatment days are given in the outpatient setting.    Cycle 1 MRD Dosing     07/18/2020 Adverse Reaction    Hospitalization: fevers. Pneumonia     07/23/2020 Biopsy    Diagnosis  Bone marrow, right iliac, aspiration and biopsy  -   Normocellular bone marrow (50%) with trilineage hematopoiesis and 1% blasts by manual aspirate differential  -   Flow cytometry MRD analysis reveals no definitive immunophenotypic evidence of residual B lymphoblastic leukemia - BCR-ABL p210 0.006%         08/10/2020 -  Chemotherapy    Cycle 2 blinatumomab-ponatinib  Ponatinib 30mg     1 IT per cycle     09/21/2020 Adverse  Reaction    Covid-19, symptomatic but not requiring hospitalization.     10/06/2020 -  Chemotherapy    Cycle 3 blinatumomab-ponatinib  Ponatinib 30mg        11/01/2020 Adverse Reaction    Hospitalization: MRSA Pneumonia requiring intubation    Blinatumomab stopped.  Ponatinib held until he stabilized.       12/14/2020 -  Chemotherapy    Cycle 4 blinatumomab 28 mcg/day days 1-28, ponatinib 30 mg per day IT triple therapy day 29     01/13/2021 -  Chemotherapy    Ponatinib 30mg  daily  - due to due low level BCR-ABL     03/06/2021 Adverse Reaction    Hospitalization - ICU on ventilator - influenza and MRSA/klebsiella PNA     06/13/2022 -  Chemotherapy    OP LEUKEMIA VINCRISTINE  vinCRIStine 2 mg IV on day 1     ALL (acute lymphoblastic leukemia) (CMS-HCC)   06/11/2020 -  Chemotherapy    IP/OP LEUKEMIA BLINATUMOMAB 7-DAY INFUSION (MINIMAL RESIDUAL DISEASE; WT >= 22 KG) (HOME INFUSION)  Cycles 1*-4: Blinatumomab 28 mcg/day Days 1-28 of 6-week cycle.  *Given in the inpatient setting on Days 1-3 on Cycle 1 and Days 1-2 on Cycle 2, while other treatment days are given in the outpatient setting.     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  *Given in the inpatient setting on Days 1-3 on Cycle 1 and Days 1-2 on Cycle 2, while other treatment days are given in the outpatient setting.     11/09/2020 Initial Diagnosis    Acute lymphoblastic leukemia in remission (CMS-HCC)     06/13/2022 -  Chemotherapy    OP LEUKEMIA VINCRISTINE  vinCRIStine 2 mg IV on day 1         Interim History:  Still not feeling well.     Low back pain woke him up in the middle of the night about a week ago.  He has not been able to relieve if by changing positions.  Pain is constant/stabbing on fire.  Pain starts in low back/butt and radiates down both legs. When he is up and walking it gets to the point that that it is too painful and he has to go down. To the floor. Then he can get back up.  Left leg - pain extends to th back of knee  Right  leg- pain extends down the front of thigh sometimes down the full leg.  Could not think of any changes prior (no trauma or unusual movements etc).  Sensation in legs is stable/intact.  No incontinence.  No change in balance.  No symptoms in his arms.   Pain was constant for multiple days. The past few the pain has built through the day no matter what I do.  He is using his pain medication to cope with the pain.    Overall he is the same. Ritalin was started recently but he decided to stop it while he is dealing with the new leg/back pain.  Otherwise he is the same: Sleeping a lot. No energy. Using inhalers helping  when he remembers. No benefit yet from increased anti-depression meds. No fevers or infectious symptoms other than cough.     Past Medical, Surgical and Family History were reviewed and pertinent updates were made in the Electronic Medical Record      ECOG Performance Status: 1    Medications:      Current Outpatient Medications   Medication Sig Dispense Refill    albuterol HFA 90 mcg/actuation inhaler Inhale 2 puffs every six (6) hours as needed for wheezing. 18 g 0    amLODIPine (NORVASC) 10 MG tablet Take 1 tablet (10 mg total) by mouth daily. 30 tablet 0    apixaban (ELIQUIS) 5 mg Tab Take 1 tablet (5 mg total) by mouth two (2) times a day. 60 tablet 6    arm brace (WRIST BRACE) Misc 1 Piece by Miscellaneous route as needed. left ulnar wrist brace 1 each 0    carvediloL (COREG) 12.5 MG tablet Take 1 tablet (12.5 mg total) by mouth Two (2) times a day. 180 tablet 1    docusate sodium (COLACE) 100 MG capsule Take 1 capsule (100 mg total) by mouth daily.      doxycycline (VIBRA-TABS) 100 MG tablet Take 1 tablet (100 mg total) by mouth two (2) times a day. 180 tablet 3    hydroCHLOROthiazide (HYDRODIURIL) 12.5 MG tablet Take 1 tablet (12.5 mg total) by mouth daily. 30 tablet 11    methylphenidate HCl (RITALIN) 5 MG tablet Take 1 tablet (5 mg total) by mouth two (2) times a day. 60 tablet 0    mirtazapine (REMERON) 45 MG tablet TAKE 1 TABLET BY MOUTH NIGHTLY 90 tablet 4    nortriptyline (PAMELOR) 50 MG capsule TAKE 2 CAPSULES BY MOUTH EVERY DAY AT NIGHT 180 capsule 4    omeprazole (PRILOSEC) 40 MG capsule Take 1 capsule (40 mg total) by mouth every morning.      ondansetron (ZOFRAN-ODT) 4 MG disintegrating tablet Take 1 tablet (4 mg total) by mouth every eight (8) hours as needed for nausea.      oxyCODONE (ROXICODONE) 10 mg immediate release tablet Take 1 tablet (10 mg total) by mouth every four (4) hours as needed for pain. 120 tablet 0    PONATinib (ICLUSIG) 30 mg tablet Take 1 tablet (30 mg total) by mouth daily. Swallow tablets whole. Do not crush, break, cut or chew tablets. 30 tablet 5    spironolactone (ALDACTONE) 25 MG tablet Take 1 tablet (25 mg total) by mouth daily. 90 tablet 3    sulfamethoxazole-trimethoprim (BACTRIM DS) 800-160 mg per tablet Take 1 tablet (160 mg of trimethoprim total) by mouth two (2) times a day. TAKE 1 TABLET BY MOUTH 2 TIMES A DAY ON SATURDAY, SUNDAY. FOR PROPHYLAXIS 48 tablet 3    tiotropium-olodaterol (STIOLTO RESPIMAT) 2.5-2.5 mcg/actuation Mist Inhale 2 puffs daily. 4 g 3    tiotropium-olodaterol (STIOLTO RESPIMAT) 2.5-2.5 mcg/actuation Mist Inhale 2 puffs daily. 4 g 0    tiotropium-olodaterol (STIOLTO RESPIMAT) 2.5-2.5 mcg/actuation Mist Inhale 2 puffs daily. 4 g 0    valACYclovir (VALTREX) 500 MG tablet Take 1 tablet (500 mg total) by mouth daily. 90 tablet 3 naloxone (NARCAN) 4 mg nasal spray One spray in either nostril once for known/suspected opioid overdose. May repeat every 2-3 minutes in alternating nostril til EMS arrives (Patient not taking: Reported on 10/17/2022) 2 each 0     No current facility-administered medications for this visit.       Vital Signs:  Vitals:  10/17/22 1214   BP: 122/90   Pulse: 78   Resp: 16   Temp: 36.9 ??C (98.4 ??F)   SpO2: 95%    - video visit    GENERAL: Well-appearing white man. NAD but depressed. Alone.   HEENT: clear sclera  HEART: Normal color, not excessive pallor. Not ashen.   CHEST/LUNG: Normal work of breathing. No dyspnea with conversation.   EXTREMITIES: Not evaluated.   SKIN: No noticable rash or petechiae.   NEURO EXAM: Grossly intact.. Stable gait.    MDM:  1 chronic life threatening illness  Drug therapy requiring intense monitoring for toxicity

## 2022-10-18 NOTE — Unmapped (Signed)
I spoke with [name], wife to patient Adam Keith, to confirm appointments on the following date(s):     7/17 MRI at [facility]    Note: she thinks this will be fine for date/time. She will have Roverto call us to reschedule if he cannot keep the appointment.    Carly Allen Derry

## 2022-10-19 ENCOUNTER — Ambulatory Visit: Admit: 2022-10-19 | Discharge: 2022-10-20 | Payer: MEDICAID

## 2022-10-19 DIAGNOSIS — E559 Vitamin D deficiency, unspecified: Principal | ICD-10-CM

## 2022-10-19 LAB — ANA: ANTINUCLEAR ANTIBODIES (ANA): NEGATIVE

## 2022-10-19 LAB — VITAMIN D 25 HYDROXY: VITAMIN D, TOTAL (25OH): 18.2 ng/mL — ABNORMAL LOW (ref 20.0–80.0)

## 2022-10-19 MED ORDER — CHOLECALCIFEROL (VITAMIN D3) 250 MCG (10,000 UNIT) CAPSULE
ORAL_CAPSULE | Freq: Every day | ORAL | 3 refills | 120 days | Status: CP
Start: 2022-10-19 — End: ?

## 2022-10-19 MED ADMIN — gadopiclenol injection 10 mL: 10 mL | INTRAVENOUS | @ 23:00:00 | Stop: 2022-10-19 | NDC 67684423202

## 2022-10-20 NOTE — Unmapped (Signed)
[facility] Refill Coordination Note    Specialty Medication(s) to be Shipped:   Hematology/Oncology: Iclusig    Other medication(s) to be shipped: No additional medications requested for fill at this time     Adam Keith, DOB: [birth year]  Phone: [phone] (work)      All above HIPAA information was verified with patient's family member, wife.     Was a Nurse, learning disability used for this call? No    Completed refill call assessment today to schedule patient's medication shipment from the [facility] [phone]).  All relevant notes have been reviewed.     Specialty medication(s) and dose(s) confirmed: Regimen is correct and unchanged.   Changes to medications: Kial reports no changes at this time.  Changes to insurance: No  New side effects reported not previously addressed with a pharmacist or physician: None reported  Questions for the pharmacist: No    Confirmed patient received a Conservation officer, historic buildings and a Surveyor, mining with first shipment. The patient will receive a drug information handout for each medication shipped and additional FDA Medication Guides as required.       DISEASE/MEDICATION-SPECIFIC INFORMATION        N/A    SPECIALTY MEDICATION ADHERENCE     Medication Adherence    Patient reported X missed doses in the last month: 0  Specialty Medication: ICLUSIG 30 mg tablet (PONATinib)  Patient is on additional specialty medications: No  Patient is on more than two specialty medications: No  Any gaps in refill history greater than 2 weeks in the last 3 months: no  Demonstrates understanding of importance of adherence: yes  Informant: spouse  Reliability of informant: reliable  Provider-estimated medication adherence level: good  Patient is at risk for Non-Adherence: No  Reasons for non-adherence: no problems identified  Confirmed plan for next specialty medication refill: delivery by pharmacy  Refills needed for supportive medications: not needed Refill Coordination    Has the Patients' Contact Information Changed: No  Is the Shipping Address Different: No         Were doses missed due to medication being on hold? No    ICLUSIG 30 mg tablet (PONATinib)    : 12 days of medicine on hand       REFERRAL TO PHARMACIST     Referral to the pharmacist: Not needed      [facility]     Shipping address confirmed in Epic.       Delivery Scheduled: Yes, Expected medication delivery date: 07/24.     Medication will be delivered via UPS to the prescription address in Epic WAM.    Antonietta Barcelona   [facility] Specialty Technician

## 2022-10-20 NOTE — Unmapped (Signed)
The [facility] has made a second and final attempt to reach this patient to refill the following medication:PONATinib: ICLUSIG 30 mg tablet.      We have left voicemails on the following phone numbers: [phone]  and have sent a Mychart questionnaire..    Dates contacted: 10/12/22 and 10/20/22   Last scheduled delivery: 09/14/22    The patient may be at risk of non-compliance with this medication. The patient should call the [facility] at [phone]  Option 4, then Option 1: Oncology to refill medication.    Ricci Barker   [facility] Specialty Technician

## 2022-10-23 LAB — TESTOSTERONE, FREE, TOTAL
TESTOSTERONE (MAYO): 187 ng/dL — ABNORMAL LOW
TESTOSTERONE FREE: 5.83 ng/dL

## 2022-10-25 MED FILL — ICLUSIG 30 MG TABLET: ORAL | 30 days supply | Qty: 30 | Fill #3

## 2022-10-27 ENCOUNTER — Telehealth
Admit: 2022-10-27 | Payer: MEDICAID | Attending: Student in an Organized Health Care Education/Training Program | Primary: Student in an Organized Health Care Education/Training Program

## 2022-10-27 NOTE — Unmapped (Signed)
No show

## 2022-11-01 ENCOUNTER — Telehealth: Admit: 2022-11-01 | Discharge: 2022-11-02 | Payer: MEDICAID

## 2022-11-01 DIAGNOSIS — F419 Anxiety disorder, unspecified: Principal | ICD-10-CM

## 2022-11-01 DIAGNOSIS — F330 Major depressive disorder, recurrent, mild: Principal | ICD-10-CM

## 2022-11-01 DIAGNOSIS — R5383 Other fatigue: Principal | ICD-10-CM

## 2022-11-01 DIAGNOSIS — N529 Male erectile dysfunction, unspecified: Principal | ICD-10-CM

## 2022-11-01 DIAGNOSIS — R7989 Other specified abnormal findings of blood chemistry: Principal | ICD-10-CM

## 2022-11-01 DIAGNOSIS — F4310 Post-traumatic stress disorder, unspecified: Principal | ICD-10-CM

## 2022-11-01 NOTE — Unmapped (Signed)
[facility]  Psychiatry--Comprehensive Cancer Support Program   Established Patient E&M Service - Outpatient       Assessment:    Adam Keith presents for follow-up evaluation. He states that he has been struggling with back and leg pain, which has contributed to his ongoing struggles with anxiety and depression. On 10/04/22, I prescribed methylphenidate 5 mg BID to help with profound fatigue and mood, but he stopped this medication after one day due to the onset of pain. He does plan to use the methylphenidate once his pain subsides further (he does report improvement in the pain over the past few weeks). Continuing his nortriptyline 100 mg at bedtime, mirtazapine 45 mg at bedtime, and methylphenidate 5 mg BID. Will reassess on 12/06/22.     Identifying Information:  Adam Keith is a 42 y.o. male with a history of PH+ B cell ALL, recurrent major depressive disorder, PTSD, anxiety, and insomnia. He has had a long struggle with intermittent depressive episodes with one suicide attempt at age 8.      Risk Assessment:  An assessment of suicide and violence risk factors was performed as part of this evaluation and is not significantly increased from the last visit.   While future psychiatric events cannot be accurately predicted, the patient does not currently require acute inpatient psychiatric care and does not currently meet [facility] involuntary commitment criteria.      Plan:    Problem 1: Major Depressive Disorder  Status of problem: chronic with moderate exacerbation  Interventions:   Continue nortriptyline 100 mg at bedtime.    Continue mirtazapine 45 mg at bedtime   Re-start methylphenidate 5 mg BID. He stopped this medication after one day due to an abrupt onset of back and leg pain. He plans to re-start this medication once the pain subsides further.  Encouraged psychotherapy with local therapist     Problem 2: Anxiety/PTSD  Status of problem: chronic with moderate exacerbation  Interventions:   See MDD above     Problem 3: Insomnia  Status of problem:  hypersomnia  Interventions:   See mirtazapine plan above      Psychotherapy provided:  No billable psychotherapy service provided.    Patient has been given this writer's contact information as well as the [facility] Psychiatry urgent line number. The patient has been instructed to call 911 for emergencies.      Subjective:    Chief complaint:  Follow-up psychiatric evaluation for depression and anxiety    Interval History:   Ongoing depression and anxiety. States that he has been struggling with pain over the past month in his back and legs, which he believes has worsened his mood and anxiety. Started the methylphenidate 5 mg BID, but stopped it after one day due to the onset of pain. Continues to report profound fatigue and hypersomnia. Sleeping 12-13 hours on non-work days and 7-8 hours on work days.     Prior Psychiatric Medication Trials: bupropion (could not tolerate), duloxetine, mirtazapine (given during recent hospitalization), lorazepam, clonazepam      Psychiatric Review of Systems  Depressive Symptoms: depressed mood, anhedonia, decreased motivation, feelings of worthlessness, fatigue  Manic Symptoms: N/A  Psychosis Symptoms: N/A  Anxiety Symptoms: excessive anxiety, ruminations, restlessness at times, easily fatigued, muscle tension  Panic Symptoms: denies  PTSD Symptoms: denies  Sleep disturbance: hypersomnia  Exercise: Unable to engage in a daily exercise routine.      Objective:      Mental Status Exam:  Appearance:    Appears stated age and Well nourished   Motor:   No abnormal movements   Speech/Language:    Normal rate, volume, tone, fluency   Mood:   Depressed   Affect:   Blunted, Calm, and Depressed   Thought process and Associations:   Logical, linear, clear, coherent, goal directed   Abnormal/psychotic thought content:     Denies SI, HI, self harm, delusions, obsessions, paranoid ideation, or ideas of reference Perceptual disturbances:     Denies auditory and visual hallucinations, behavior not concerning for response to internal stimuli     Other:   Insight and judgment intact     I personally spent 23 minutes face-to-face and non-face-to-face in the care of this patient, which includes all pre, intra, and post visit time on the date of service.    Visit was completed by video (or phone) and the appropriate disclaimer has been included below.    The patient reports they are physically located in [place] and is currently: at home. I conducted a audio/video visit. I spent  8 minutes on the video call with the patient. I spent an additional 15 minutes on pre- and post-visit activities on the date of service .        Sheran Lawless, PMHNP  11/01/2022

## 2022-11-03 DIAGNOSIS — C9101 Acute lymphoblastic leukemia, in remission: Principal | ICD-10-CM

## 2022-11-15 MED ORDER — OXYCODONE 10 MG TABLET
ORAL_TABLET | ORAL | 0 refills | 20 days | Status: CP | PRN
Start: 2022-11-15 — End: ?

## 2022-11-23 NOTE — Unmapped (Signed)
[facility] Refill Coordination Note    Specialty Medication(s) to be Shipped:   Hematology/Oncology: Iclusig    Other medication(s) to be shipped: No additional medications requested for fill at this time     Adam Keith, DOB: 1980-11-14  Phone: [phone] (work)      All above HIPAA information was verified with patient.     Was a Nurse, learning disability used for this call? No    Completed refill call assessment today to schedule patient's medication shipment from the [facility] [phone]).  All relevant notes have been reviewed.     Specialty medication(s) and dose(s) confirmed: Regimen is correct and unchanged.   Changes to medications: [name] reports no changes at this time.  Changes to insurance: No  New side effects reported not previously addressed with a pharmacist or physician: None reported  Questions for the pharmacist: No    Confirmed patient received a Conservation officer, historic buildings and a Surveyor, mining with first shipment. The patient will receive a drug information handout for each medication shipped and additional FDA Medication Guides as required.       DISEASE/MEDICATION-SPECIFIC INFORMATION        N/A    SPECIALTY MEDICATION ADHERENCE     Medication Adherence    Patient reported X missed doses in the last month: 0  Specialty Medication: Iclusig 30 mg  Patient is on additional specialty medications: No  Informant: patient     Were doses missed due to medication being on hold? No    Iclusig 30 mg: 14 days of medicine on hand       REFERRAL TO PHARMACIST     Referral to the pharmacist: Not needed      [facility]     Shipping address confirmed in Epic.     Delivery Scheduled: Yes, Expected medication delivery date: 12/01/22.     Medication will be delivered via UPS to the prescription address in [employer] [place].    Adam Keith   [facility] Specialty Technician

## 2022-11-25 MED ORDER — SPIRONOLACTONE 25 MG TABLET
ORAL_TABLET | Freq: Every day | ORAL | 3 refills | 0 days
Start: 2022-11-25 — End: ?

## 2022-11-25 NOTE — Unmapped (Signed)
Please refill if appropriate.    Most recent clinic visit: 10/17/2022    Next clinic visit: 12/08/2022

## 2022-11-28 MED ORDER — SPIRONOLACTONE 25 MG TABLET
ORAL_TABLET | Freq: Every day | ORAL | 3 refills | 90 days | Status: CP
Start: 2022-11-28 — End: 2023-11-28

## 2022-11-30 MED FILL — ICLUSIG 30 MG TABLET: ORAL | 30 days supply | Qty: 30 | Fill #4

## 2022-12-06 ENCOUNTER — Telehealth: Admit: 2022-12-06 | Discharge: 2022-12-07 | Payer: MEDICAID

## 2022-12-06 DIAGNOSIS — F419 Anxiety disorder, unspecified: Principal | ICD-10-CM

## 2022-12-06 DIAGNOSIS — Z79899 Other long term (current) drug therapy: Principal | ICD-10-CM

## 2022-12-06 DIAGNOSIS — F4310 Post-traumatic stress disorder, unspecified: Principal | ICD-10-CM

## 2022-12-06 DIAGNOSIS — F331 Major depressive disorder, recurrent, moderate: Principal | ICD-10-CM

## 2022-12-06 NOTE — Unmapped (Signed)
[facility]  Psychiatry--Comprehensive Cancer Support Program   Established Patient E&M Service - Outpatient       Assessment:    Adam Keith presents for follow-up evaluation. He states that he continues to struggle with intermittent nerve pain in his legs along with profound fatigue. States that his fatigue is limiting his quality of life. He has not been using the methylphenidate because he thought it would be best to hold it when he's in pain, but I encouraged him to re-try the medication at 1/2 the prescribed dose to see if it improves his fatigue without making him feel jittery. He meets with oncology on 9/5 and I have ordered a nortriptyline level to add onto his current lab orders. We may need to explore different antidepressant options, but he is resistant to making any changes despite ongoing depression and anxiety. Continuing his nortriptyline 100 mg at bedtime, mirtazapine 45 mg at bedtime, and methylphenidate 5 mg BID. Will reassess on 03/07/23.     Identifying Information:  Adam Keith is a 42 y.o. male with a history of PH+ B cell ALL, recurrent major depressive disorder, PTSD, anxiety, and insomnia. He has had a long struggle with intermittent depressive episodes with one suicide attempt at age 64.      Risk Assessment:  An assessment of suicide and violence risk factors was performed as part of this evaluation and is not significantly increased from the last visit.   While future psychiatric events cannot be accurately predicted, the patient does not currently require acute inpatient psychiatric care and does not currently meet [facility] involuntary commitment criteria.      Plan:    Problem 1: Major Depressive Disorder  Status of problem: chronic with moderate exacerbation  Interventions:   Continue nortriptyline 100 mg at bedtime.    Continue mirtazapine 45 mg at bedtime   Re-start methylphenidate 5 mg BID.   Encouraged psychotherapy with local therapist     Problem 2: Anxiety/PTSD  Status of problem: chronic with moderate exacerbation  Interventions:   See MDD above     Problem 3: Insomnia  Status of problem:  hypersomnia  Interventions:   See mirtazapine plan above      Psychotherapy provided:  No billable psychotherapy service provided.    Patient has been given this writer's contact information as well as the [facility] Psychiatry urgent line number. The patient has been instructed to call 911 for emergencies.      Subjective:    Chief complaint:  Follow-up psychiatric evaluation for depression, anxiety, PTSD, and sleep    Interval History:   I constantly feel so bad.  If I do something that exerts me, my whole day is shot.  Still endorsing intermittent nerve pain that shoots down his legs and ongoing profound fatigue. Moderate depressive symptoms reported. Hypersomnia on non-work days. Decreased motivation.     I previously prescribed methylphenidate 5 mg BID for fatigue and he only used it for a few days and states that he felt like he had the jitters. I encouraged him to re-try the medication at 1/2 tablet to see if this helps him with the fatigue without increasing his anxiety. Explained to patient that sometimes it takes a little time for the body to get used to a stimulant and it's ok to start with a low dose.     Prior Psychiatric Medication Trials: bupropion (could not tolerate), duloxetine, mirtazapine (given during recent hospitalization), lorazepam, clonazepam      Psychiatric  Review of Systems  Depressive Symptoms: depressed mood, anhedonia, decreased motivation, feelings of worthlessness, fatigue  Manic Symptoms: N/A  Psychosis Symptoms: N/A  Anxiety Symptoms: excessive anxiety, ruminations, restlessness at times, easily fatigued, muscle tension  Panic Symptoms: denies  PTSD Symptoms: denies  Sleep disturbance: hypersomnia  Exercise: Unable to engage in a daily exercise routine.      Objective:      Mental Status Exam:  Appearance:    Appears stated age, Well nourished, and Clean/Neat   Motor:   No abnormal movements   Speech/Language:    Normal rate, volume, tone, fluency   Mood:   Depressed   Affect:   Blunted, Calm, Cooperative, and Depressed   Thought process and Associations:   Logical, linear, clear, coherent, goal directed   Abnormal/psychotic thought content:     Denies SI, HI, self harm, delusions, obsessions, paranoid ideation, or ideas of reference   Perceptual disturbances:     Denies auditory and visual hallucinations, behavior not concerning for response to internal stimuli     Other:   Insight and judgment intact     I personally spent 27 minutes face-to-face and non-face-to-face in the care of this patient, which includes all pre, intra, and post visit time on the date of service.    Visit was completed by video (or phone) and the appropriate disclaimer has been included below.    The patient reports they are physically located in [place] and is currently: at home. I conducted a audio/video visit. I spent  71m 31s on the video call with the patient. I spent an additional 15 minutes on pre- and post-visit activities on the date of service .        Sheran Lawless, PMHNP  12/06/2022

## 2022-12-06 NOTE — Unmapped (Signed)
Comprehensive Cancer Support Program (CCSP) - Psychiatry Outpatient Clinic   After Visit Summary    It was a pleasure to see you today in the [facility] Comprehensive Cancer Support Program (CCSP). The CCSP is a multidisciplinary program dedicated to helping patients, caregivers, and families with cancer treatment, recovery and survivorship.      To schedule, cancel, or change your appointment:  Please call the [facility] schedulers at [phone], Monday through Friday 8AM - 5PM.  Someone will return your call within 24 hours.      If you have a question about your medicines or you need to contact your provider:  First, try sending a My Chart message to Noelie Renfrow, PMHNP. If you are unable to do so, please call the CCSP program coordinator, Devon Suitt, at [phone].     For after hours urgent issues, you may call [phone] or call the "I need to talk" line at 1-800-273-TALK (8255) anytime 24/7.    CCSP Patient and Family Resource Center: [phone].    CCSP Website:  [email or web address]    For prescription refills, please allow at least 24 hours (during business hours, M-F) for providers to call in refills to your pharmacy. We are generally unable to accommodate same-day requests for refills.     If you are taking any controlled substances (such as anxiety or sleep medications), you must use them as the directions say to use them. We generally do not provide early refills over the phone without clear reason, and it would be inappropriate to obtain the medications from other doctors. We routinely use the [place] controlled substance database to monitor prescription drug use.

## 2022-12-07 NOTE — Unmapped (Signed)
[facility]    Patient Name: Adam Keith  Patient Age: 42 y.o.  Encounter Date: 12/08/2022    Primary Care Provider:  Dierdre Harness, MD    Referring Physician:  Marisa Sprinkles Per Patient  [address] [place],  [place] [address]    Reason for visit:  Ph+ B-ALL follow up. Increased fatigue.      PMR?  Trial steroids 15 mg  Esr, ana, anca, lh fsh shgb    Assessment:  A 42 y.o. 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 The course was complicated by septic shock, candida krusei fungemia, MRSA bacteremia with septic emboli c/b acute renal failure and respiratory distress requiring dialysis and intubation.  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  He subsequently had difficulty tolerating single agent dasatinib (as a bridge to planned ponatinib-blinatumomab--had fluid retention and pleural effusion).  He then completed 4 cycles of ponatinib-blinatumomab, initiated for treatment of MRD in the setting of poor tolerance of standard therapy.      He is currently on ponatinib 30 mg monotherapy (since 01/2021).  When we decreased his ponatinib to 15 mg, he had a rise in transcripts. Most recent BCR-ABL is + at 0.002. Patient feels strongly that he would want to increase the ponatinib if BCR/ABL remains positive/rising.      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  - Continue aspirin while on ponatinib (also on apixaban)   - BCR-ABL q 3 months on peripheral blood     Fatigue with BL symmetric oligoarthritis of shoulders, hips and knees and increased inflammatory markers: Rule out polymyalgia rheumatica vs other inflammatory processes vs side effects from TKI vs chronic infectious process.   - Symptoms consistent most significantly with severe fatigue, myalgias particularly at night and pain in hips, shoulders and knees. He denies morning stiffness in any of the affected joints and hand joints are spared. Intermittent headaches present but no temporal tenderness. No evidence of GCA headaches or jaw claudication.    - Trial of Prednisone 15 mg on 11/07/2022 to determine if there is improvement of symptoms and inflammatory markers to confirm diagnosis. If no improvement next step would be to try 25 mg of Prednisone for 1 week. If no improvement with this approach then will likely require further workup for other causes and refer to rheumatology.  - Repeat inflammatory markers next visit. ESR not available this visit.  - Multifactorial etiology for fatigue. Could be related to known depression, COPD, OSA unable to tolerate CPAP, Vit D deficiency, prior infections and male hypogonadism (work up pending)  - May need to rule out infection (hepatitis, CMV, EBV, mycobacterial) if steroids provide no improvement or worsening of symptoms.    Rule out male hypogonadism:  - Testosterone level low. FSH/LH pending.  - Will likely need to repeat Total testosterone level at 8 am prior to next visit to confirm, given diurnal fluctuations  - SHBG pending, patient is obese.    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   - No plan for further IVIG given several reactions   -  Rec Flu and Covid vaccines     DRESS: followed by Dr. 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 Now s/p dialysis  - stable Cr appears to have developed CKD due to incompletely healed acute injury.  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 CT chest 02/06/22 with residual subacute thrombus.   - Indefinite anticoagulation with apixaban (in addition to asa given ponatinib)     Vitamin D Deficiency: was low 07/2022.   - started supplementation 07/2022.   - He has not noticed any change in symptoms with it. Will recheck level in 1 month.     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 [name] and Dr. Verlan Friends and Dr. Maple Mirza following patient.    - long term doxycycline with goal of decolonization  **Hx Candidemia during induction - now s/p Cresemba  - followed by Dr. [name]  **Hx Covid-19 x 4 documented   - possible long-Covid symptoms    Symptom management: bowel regimen as listed in medication list, analgesics as listed in medication list or symptoms reviewed and due to acceptable control, no changes made  **Peripheral neuropathy, sensory and motor  - Grade 1-2 monitor  **Chronic Back Pain, complicated by hospital stay and procedures  - Followed by Dr. Genice Rouge, Palliative Care  **Depression/anxiety: following with [name], [facility]  - management per psychiatry     Venous access: I recommend the following change for venous access : PICC line removal      Supportive Care Recommendations:  We recommend based on the patient???s underlying diagnosis and treatment history the following supportive care:    1. Antimicrobial prophylaxis:   - Primary management per ICID  - On daily doxy for recurrent MRSA pna    - Viral - valacyclovir 500mg  daily  - Bacterial - levofloxacin 500 mg if ANC <0.5  - Fungal - previously on treatment isavuconazole - off now  - PJP - On Bactrim DS BID Sat/Sun  - recommend Annual flu shot - declines thus far  - recommend updated Covid vaccine - declines    2. Blood product support:  I recommend the following intervals for laboratory monitoring to determine transfusion needs and monitor for hematologic effects of therapy and underlying disease: no routine monitoring outside of clinic follow-up    Leukoreduced blood products are required.  Irradiated blood products are preferred, but in case of urgent transfusion needs non-irradiated blood products may be used:     -  RBC transfusion threshold: transfuse 2 units for Hgb < 8 g/dL.  -  Platelet transfusion threshold: transfuse 1 unit of platelets for platelet count < 10, or for bleeding or need for invasive procedure.    3. Hematopoietic growth factor support: none    Care coordination: The next [facility] Leukemia  Clinic Follow up requested:  - follow up lab results  - currently RTC in 3 weeks    Rosaura Carpenter, MD    Mariel Aloe, MD was available.  Leukemia Program  Division of Hematology  [facility]     Nurse Navigator (non-clinical trial patients): Elicia Lamp, RN        Tel. [phone]       Fax. [phone]  Toll-free appointments: [phone]  Scheduling assistance: [phone]  After hours/weekends: [phone] (ask for adult hematology/oncology on-call)            History of Present Illness:  We had the pleasure of seeing Mickael Mcnutt in the Leukemia Clinic at the [facility] [place] on 12/08/2022.  He is a 42 y.o. male with  Ph+ B-ALL .  He continues to complain of significant fatigue, predominantly at night but it usually lasts all day. He states having significant amount of pain in all his big muscle groups nocturnally and has constant pain in shoulders, hips and back. He has not noticed pain in his arms and denies morning stiffness of hands or other joints. He may have intermittent headaches that are usually located on the left side of his head and behind his eye, and notices some paresthesias on his face when chewing food but does not describe claudication as his symptoms. Currently he denies having a headache. He denies any fevers but admits having night sweats and chills overnight.        His oncologic history is as follows:    Review of Systems   Constitutional:  Positive for chills, diaphoresis and malaise/fatigue. Negative for fever and weight loss.   HENT:  Positive for hearing loss. Negative for congestion, ear pain, nosebleeds, sinus pain, sore throat and tinnitus.    Eyes:  Negative for blurred vision, double vision, photophobia, discharge and redness.   Respiratory:  Positive for cough and shortness of breath. Negative for hemoptysis and sputum production.    Cardiovascular:  Negative for chest pain, palpitations, orthopnea, claudication, leg swelling and PND.   Gastrointestinal:  Negative for abdominal pain, blood in stool, constipation, diarrhea, melena, nausea and vomiting.   Genitourinary:  Negative for dysuria, flank pain, frequency, hematuria and urgency.   Musculoskeletal:  Positive for back pain, joint pain and myalgias. Negative for neck pain.   Skin:  Negative for itching and rash.   Neurological:  Positive for sensory change and headaches. Negative for dizziness, tingling and focal weakness.   Endo/Heme/Allergies:  Does not bruise/bleed easily.   Psychiatric/Behavioral:  Positive for depression. Negative for hallucinations, substance abuse and suicidal ideas.    All other systems reviewed and are negative.    Hematology/Oncology History Overview Note   Referring/Local Oncologist: None    Diagnosis:Ph+ ALL    Genetics:    Karyotype/FISH:Abnormal Karyotype: 46,XY,t(9;22)(q34;q11.2)[1]/45,XY,der(7;9)(q10;q10)t(9;22)(q34;q11.2),der(22)t(9;22)[11]/46,sdl,+der(22)t(9;22)[5]/46,XY[3]     Abnormal FISH: A BCR/ABL1 interphase FISH assay shows an abnormal signal pattern in 97% of the 100 cells scored. 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  The findings support a diagnosis of leukemia and have implications for targeted therapy and for monitoring residual disease.      Molecular Genetics:  BCR-ABL1 p210 transcripts were detected at a level of 46.479 IS% ratio in bone marrow.  BCR-ABL1 p190 transcripts were detected at a level of 4 in 100,000 cells in bone marrow.  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)     01/24/2020 - 04/02/2020 Chemotherapy    IP/OP LEUKEMIA GRAAPH-2005 + RITUXIMAB < 60 YO  rituximab hypercvad (odd and even course)     02/24/2020 Remission    CR with PCR MRD+; marrow showing 70% blasts with <1% blasts, negative flow MRD, BCR-ABL p210 PCR 0.197%; CNS negative for blasts     03/09/2020 Progression    CSF - rare blast ID'ed - IT chemo given    03/13/20 - IT chemo, CSF negative     04/16/2020 Biopsy    BM Bx with 40-50% cellularity, <1% blasts, MRD flow cytometry negative, BCR-ABL p210 transcripts 0.036%     05/28/2020 - 05/28/2020 Chemotherapy    OP AML - CNS THERAPY (INTRATHECAL CYTARABINE, INTRATHECAL METHOTREXATE, OR INTRATHECAL TRIPLE)  Select one of the following: cytarabine IT 100 mg with hydrocortisone 50 mg, cytarabine IT 40 mg with methotrexate 15 mg with hydrocortisone 50 mg, OR methotrexate IT 12 mg with hydrocortisone 50 mg     06/19/2020 -  Chemotherapy    IP/OP LEUKEMIA BLINATUMOMAB 7-DAY INFUSION (MINIMAL RESIDUAL DISEASE; WT >= 22 KG) (HOME INFUSION)      Cycles 1*-4: Blinatumomab 28 mcg/day Days 1-28 of 6-week cycle.  *Given in the inpatient setting on Days 1-3 on Cycle 1 and Days 1-2 on Cycle 2, while other treatment days are given in the outpatient setting.    Cycle 1 MRD Dosing     07/18/2020 Adverse Reaction    Hospitalization: fevers. Pneumonia     07/23/2020 Biopsy    Diagnosis  Bone marrow, right iliac, aspiration and biopsy  -   Normocellular bone marrow (50%) with trilineage hematopoiesis and 1% blasts by manual aspirate differential  -   Flow cytometry MRD analysis reveals no definitive immunophenotypic evidence of residual B lymphoblastic leukemia   - BCR-ABL p210 0.006%         08/10/2020 -  Chemotherapy    Cycle 2 blinatumomab-ponatinib  Ponatinib 30mg     1 IT per cycle     09/21/2020 Adverse Reaction    Covid-19, symptomatic but not requiring hospitalization.     10/06/2020 -  Chemotherapy    Cycle 3 blinatumomab-ponatinib  Ponatinib 30mg        11/01/2020 Adverse Reaction    Hospitalization: MRSA Pneumonia requiring intubation    Blinatumomab stopped.  Ponatinib held until he stabilized.       12/14/2020 -  Chemotherapy    Cycle 4 blinatumomab 28 mcg/day days 1-28, ponatinib 30 mg per day IT triple therapy day 29     01/13/2021 -  Chemotherapy    Ponatinib 30mg  daily  - due to due low level BCR-ABL     03/06/2021 Adverse Reaction    Hospitalization - ICU on ventilator - influenza and MRSA/klebsiella PNA     06/13/2022 -  Chemotherapy    OP LEUKEMIA VINCRISTINE  vinCRIStine 2 mg IV on day 1     ALL (acute lymphoblastic leukemia) (CMS-HCC)   06/11/2020 -  Chemotherapy    IP/OP LEUKEMIA BLINATUMOMAB 7-DAY INFUSION (MINIMAL RESIDUAL DISEASE; WT >= 22 KG) (HOME INFUSION)  Cycles 1*-4: Blinatumomab 28 mcg/day Days 1-28 of 6-week cycle.  *Given in the inpatient setting on Days 1-3 on Cycle 1 and Days 1-2 on  Cycle 2, while other treatment days are given in the outpatient setting.     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  *Given in the inpatient setting on Days 1-3 on Cycle 1 and Days 1-2 on Cycle 2, while other treatment days are given in the outpatient setting.     11/09/2020 Initial Diagnosis    Acute lymphoblastic leukemia in remission (CMS-HCC)     06/13/2022 -  Chemotherapy    OP LEUKEMIA VINCRISTINE  vinCRIStine 2 mg IV on day 1           Past Medical, Surgical and Family History were reviewed and pertinent updates were made in the Electronic Medical Record      ECOG Performance Status: 1    Medications:      Current Outpatient Medications   Medication Sig Dispense Refill    albuterol HFA 90 mcg/actuation inhaler Inhale 2 puffs every six (6) hours as needed for wheezing. 18 g 0    amLODIPine (NORVASC) 10 MG tablet Take 1 tablet (10 mg total) by mouth daily. 30 tablet 0    apixaban (ELIQUIS) 5 mg Tab Take 1 tablet (5 mg total) by mouth two (2) times a day. 60 tablet 6    arm brace (WRIST BRACE) Misc 1 Piece by Miscellaneous route as needed. left ulnar wrist brace 1 each 0    carvediloL (COREG) 12.5 MG tablet Take 1 tablet (12.5 mg total) by mouth Two (2) times a day. 180 tablet 1    cholecalciferol, vitamin D3-250 mcg, 10,000 unit,, 250 mcg (10,000 unit) capsule Take 1 capsule (250 mcg total) by mouth daily. 120 capsule 3    docusate sodium (COLACE) 100 MG capsule Take 1 capsule (100 mg total) by mouth daily.      doxycycline (VIBRA-TABS) 100 MG tablet Take 1 tablet (100 mg total) by mouth two (2) times a day. 180 tablet 3    hydroCHLOROthiazide (HYDRODIURIL) 12.5 MG tablet Take 1 tablet (12.5 mg total) by mouth daily. 30 tablet 11    mirtazapine (REMERON) 45 MG tablet TAKE 1 TABLET BY MOUTH NIGHTLY 90 tablet 4    naloxone (NARCAN) 4 mg nasal spray One spray in either nostril once for known/suspected opioid overdose. May repeat every 2-3 minutes in alternating nostril til EMS arrives (Patient not taking: Reported on 10/17/2022) 2 each 0    nortriptyline (PAMELOR) 50 MG capsule TAKE 2 CAPSULES BY MOUTH EVERY DAY AT NIGHT 180 capsule 4    omeprazole (PRILOSEC) 40 MG capsule Take 1 capsule (40 mg total) by mouth every morning.      ondansetron (ZOFRAN-ODT) 4 MG disintegrating tablet Take 1 tablet (4 mg total) by mouth every eight (8) hours as needed for nausea.      oxyCODONE (ROXICODONE) 10 mg immediate release tablet Take 1 tablet (10 mg total) by mouth every four (4) hours as needed for pain. 120 tablet 0    PONATinib (ICLUSIG) 30 mg tablet Take 1 tablet (30 mg total) by mouth daily. Swallow tablets whole. Do not crush, break, cut or chew tablets. 30 tablet 5  spironolactone (ALDACTONE) 25 MG tablet TAKE 1 TABLET (25 MG TOTAL) BY MOUTH DAILY. 90 tablet 3    sulfamethoxazole-trimethoprim (BACTRIM DS) 800-160 mg per tablet Take 1 tablet (160 mg of trimethoprim total) by mouth two (2) times a day. TAKE 1 TABLET BY MOUTH 2 TIMES A DAY ON SATURDAY, SUNDAY. FOR PROPHYLAXIS 48 tablet 3    tiotropium-olodaterol (STIOLTO RESPIMAT) 2.5-2.5 mcg/actuation Mist Inhale 2 puffs daily. 4 g 3    tiotropium-olodaterol (STIOLTO RESPIMAT) 2.5-2.5 mcg/actuation Mist Inhale 2 puffs daily. 4 g 0    tiotropium-olodaterol (STIOLTO RESPIMAT) 2.5-2.5 mcg/actuation Mist Inhale 2 puffs daily. 4 g 0    valACYclovir (VALTREX) 500 MG tablet Take 1 tablet (500 mg total) by mouth daily. 90 tablet 3     No current facility-administered medications for this visit.       Vital Signs:  There were no vitals filed for this visit.     Physical Exam  Vitals and nursing note reviewed.   Constitutional:       General: He is not in acute distress.     Appearance: Normal appearance. He is well-developed. He is obese. He is not ill-appearing or toxic-appearing.   HENT:      Head: Normocephalic and atraumatic.      Jaw: No tenderness.      Comments: No temporal tenderness     Nose: Nose normal. No congestion or rhinorrhea.      Mouth/Throat:      Mouth: Mucous membranes are moist.      Pharynx: Oropharynx is clear.   Eyes:      Extraocular Movements: Extraocular movements intact.      Conjunctiva/sclera: Conjunctivae normal.      Pupils: Pupils are equal, round, and reactive to light.   Cardiovascular:      Rate and Rhythm: Normal rate and regular rhythm.      Pulses: Normal pulses.      Heart sounds: No murmur heard.     No friction rub. No gallop.   Pulmonary:      Effort: Pulmonary effort is normal. No respiratory distress.      Breath sounds: Examination of the right-lower field reveals decreased breath sounds. Examination of the left-lower field reveals decreased breath sounds. Decreased breath sounds present. No wheezing or rhonchi.   Chest:      Chest wall: No tenderness.   Abdominal:      General: Abdomen is flat. Bowel sounds are normal. There is no distension.      Palpations: Abdomen is soft. There is no mass.      Tenderness: There is no abdominal tenderness. There is no guarding or rebound.   Musculoskeletal:      Right shoulder: Tenderness present. Decreased range of motion.      Left shoulder: Tenderness present. Decreased range of motion.      Right hand: No swelling, deformity or tenderness.      Left hand: No swelling, deformity or tenderness.      Cervical back: Normal range of motion. Tenderness present. No rigidity. Muscular tenderness present.      Thoracic back: Tenderness present.      Lumbar back: Tenderness present.      Right knee: Effusion present. Tenderness present.      Left knee: Effusion present. Tenderness present.      Comments: Significant bilateral paraspinal tenderness through out the spine   Skin:     General: Skin is warm.      Capillary Refill: Capillary  refill takes 2 to 3 seconds.      Findings: No erythema, lesion or rash.   Neurological:      General: No focal deficit present.      Mental Status: He is alert and oriented to person, place, and time. Cranial Nerves: No cranial nerve deficit.      Sensory: No sensory deficit.      Motor: No weakness.   Psychiatric:         Mood and Affect: Mood is depressed.         Behavior: Behavior is slowed.         Thought Content: Thought content normal.         Judgment: Judgment normal.       MDM:  1 chronic life threatening illness  Drug therapy requiring intense monitoring for toxicity

## 2022-12-08 ENCOUNTER — Ambulatory Visit: Admit: 2022-12-08 | Discharge: 2022-12-09 | Payer: MEDICAID | Attending: Hematology | Primary: Hematology

## 2022-12-08 ENCOUNTER — Other Ambulatory Visit: Admit: 2022-12-08 | Discharge: 2022-12-09 | Payer: MEDICAID

## 2022-12-08 ENCOUNTER — Ambulatory Visit
Admit: 2022-12-08 | Discharge: 2022-12-09 | Payer: MEDICAID | Attending: Student in an Organized Health Care Education/Training Program | Primary: Student in an Organized Health Care Education/Training Program

## 2022-12-08 ENCOUNTER — Ambulatory Visit: Admit: 2022-12-08 | Discharge: 2022-12-09 | Payer: MEDICAID

## 2022-12-08 DIAGNOSIS — C9101 Acute lymphoblastic leukemia, in remission: Principal | ICD-10-CM

## 2022-12-08 DIAGNOSIS — C9100 Acute lymphoblastic leukemia not having achieved remission: Principal | ICD-10-CM

## 2022-12-08 DIAGNOSIS — Z79899 Other long term (current) drug therapy: Principal | ICD-10-CM

## 2022-12-08 DIAGNOSIS — Z515 Encounter for palliative care: Principal | ICD-10-CM

## 2022-12-08 DIAGNOSIS — E559 Vitamin D deficiency, unspecified: Principal | ICD-10-CM

## 2022-12-08 DIAGNOSIS — F331 Major depressive disorder, recurrent, moderate: Principal | ICD-10-CM

## 2022-12-08 LAB — CBC W/ AUTO DIFF
BASOPHILS ABSOLUTE COUNT: 0.1 10*9/L (ref 0.0–0.1)
BASOPHILS RELATIVE PERCENT: 1.1 %
EOSINOPHILS ABSOLUTE COUNT: 0.2 10*9/L (ref 0.0–0.5)
EOSINOPHILS RELATIVE PERCENT: 2.9 %
HEMATOCRIT: 48.5 % — ABNORMAL HIGH (ref 39.0–48.0)
HEMOGLOBIN: 16.6 g/dL — ABNORMAL HIGH (ref 12.9–16.5)
LYMPHOCYTES ABSOLUTE COUNT: 2.1 10*9/L (ref 1.1–3.6)
LYMPHOCYTES RELATIVE PERCENT: 25.1 %
MEAN CORPUSCULAR HEMOGLOBIN CONC: 34.2 g/dL (ref 32.0–36.0)
MEAN CORPUSCULAR HEMOGLOBIN: 29.6 pg (ref 25.9–32.4)
MEAN CORPUSCULAR VOLUME: 86.5 fL (ref 77.6–95.7)
MEAN PLATELET VOLUME: 9.2 fL (ref 6.8–10.7)
MONOCYTES ABSOLUTE COUNT: 0.3 10*9/L (ref 0.3–0.8)
MONOCYTES RELATIVE PERCENT: 4.1 %
NEUTROPHILS ABSOLUTE COUNT: 5.6 10*9/L (ref 1.8–7.8)
NEUTROPHILS RELATIVE PERCENT: 66.8 %
PLATELET COUNT: 170 10*9/L (ref 150–450)
RED BLOOD CELL COUNT: 5.61 10*12/L — ABNORMAL HIGH (ref 4.26–5.60)
RED CELL DISTRIBUTION WIDTH: 15.6 % — ABNORMAL HIGH (ref 12.2–15.2)
WBC ADJUSTED: 8.4 10*9/L (ref 3.6–11.2)

## 2022-12-08 LAB — COMPREHENSIVE METABOLIC PANEL
ALBUMIN: 4 g/dL (ref 3.4–5.0)
ALKALINE PHOSPHATASE: 94 U/L (ref 46–116)
ALT (SGPT): 41 U/L (ref 10–49)
ANION GAP: 6 mmol/L (ref 5–14)
AST (SGOT): 24 U/L (ref <=34)
BILIRUBIN TOTAL: 0.5 mg/dL (ref 0.3–1.2)
BLOOD UREA NITROGEN: 11 mg/dL (ref 9–23)
BUN / CREAT RATIO: 7
CALCIUM: 9.6 mg/dL (ref 8.7–10.4)
CHLORIDE: 106 mmol/L (ref 98–107)
CO2: 26 mmol/L (ref 20.0–31.0)
CREATININE: 1.56 mg/dL — ABNORMAL HIGH
EGFR CKD-EPI (2021) MALE: 56 mL/min/{1.73_m2} — ABNORMAL LOW (ref >=60)
GLUCOSE RANDOM: 126 mg/dL (ref 70–179)
POTASSIUM: 3.9 mmol/L (ref 3.4–4.8)
PROTEIN TOTAL: 6.3 g/dL (ref 5.7–8.2)
SODIUM: 138 mmol/L (ref 135–145)

## 2022-12-08 LAB — LUTEINIZING HORMONE
LUTEINIZING HORMONE: 7.6 m[IU]/mL
LUTEINIZING HORMONE: 7.2 m[IU]/mL

## 2022-12-08 LAB — FOLLICLE STIMULATING HORMONE
FOLLICLE STIMULATING HORMONE: 15.3 m[IU]/mL
FOLLICLE STIMULATING HORMONE: 13.9 m[IU]/mL

## 2022-12-08 LAB — SLIDE REVIEW

## 2022-12-08 LAB — CK: CREATINE KINASE TOTAL: 126 U/L

## 2022-12-08 MED ORDER — HYDROCHLOROTHIAZIDE 12.5 MG TABLET
ORAL_TABLET | Freq: Every day | ORAL | 11 refills | 30 days | Status: CP
Start: 2022-12-08 — End: 2023-12-08

## 2022-12-08 MED ORDER — APIXABAN 5 MG TABLET
ORAL_TABLET | Freq: Two times a day (BID) | ORAL | 6 refills | 30 days | Status: CP
Start: 2022-12-08 — End: ?

## 2022-12-08 MED ORDER — OXYCODONE 10 MG TABLET
ORAL_TABLET | ORAL | 0 refills | 20 days | Status: CP | PRN
Start: 2022-12-08 — End: ?

## 2022-12-08 MED ORDER — PREDNISONE 5 MG TABLET
ORAL_TABLET | Freq: Every day | ORAL | 3 refills | 28 days | Status: CP
Start: 2022-12-08 — End: 2023-03-30

## 2022-12-08 MED ORDER — PREGABALIN 50 MG CAPSULE
ORAL_CAPSULE | Freq: Three times a day (TID) | ORAL | 3 refills | 30 days | Status: CP
Start: 2022-12-08 — End: 2023-12-08

## 2022-12-08 NOTE — Unmapped (Unsigned)
OUTPATIENT ONCOLOGY PALLIATIVE CARE     Principal Diagnosis: [name] is a 42 y.o. 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 He was recently admitted to [facility] with lowe extremity pain and edema. Has had episodic fluctuations in mood.  Has decided not to pursue bone marrow transplant at this time. Has felt chronically ill for the last year.  Unclear unfortunately exactly what is contributing to this, like the combination of treatment side effects and refractory depression.  Appreciate oncology and psychiatry assistance in management.     Assessment/Plan:      # Depressed Mood: Chronic depression, he has felt more down recently due to constitutional fatigue, pain, inability to go outside and do things that bring meaning to him.  While he has felt down, he does state his mood has been stable for the most part; denies SI/HI. Continues to follow with [name]  -Medications per [name]        #Pain: Chronic, persistent pain, primarily in joints. Associated with/exacerbated by sitting too long, though he also says that activity seems to make it worst. Previously trialed butrans patch but did not find this effective. He feels that current oxycodone regimen helps keep him functional.  Unfortunately pain has worsened and he has been much less active.  However, after detailed discussion today he decided not to make significant changes.    -Self discontinued gabapentin.  Given sciatic pain, encouraged him to restart this but unclear if she will  - oxycodone 10 mg q4 prn for breakthrough pain, using 4-6 times a day.  New prescription written today      #Persistent productive cough, ENT infections, low grade fevers  -Not addressed in detail today      # LUE pain: Ongoing.   - Pain management as above     #Chronic pain: At baseline, see above  - Pain management as above     #Advance care planning: Continues to prioritize quality of life.  After discussion with infectious disease as well as oncology has made the decision not to pursue another bone marrow transplant at this time.  Reviewed today if he is considering not continuing cancer directed therapy.  He states that despite how poorly he has been feeling he is not at that stage yet.     #Controlled substances risk management:  Patient does not have a signed pain medication agreement with our team.  [place] database was reviewed today and it was appropriate.  Urine drug screen was not performed at this visit. Findings: not applicable.  Patient has received information about safe storage and administration of medications.  Patient has not received a prescription for narcan; is not applicable.      F/u: 4-6 weeks     ----------------------------------------  Referring Provider: Dr. Berline Lopes  Oncology Team: Malignant hematology team  PCP: [name], MD     HPI: Adam Keith with a diagnosis of B-ALL, diagnosed in October 2021, now in complete remission. Recently admitted to the [facility] ICU 03/06/21-03/20/21 for AHRF d/t MRSA/klebsiella PNA, requiring intubation. This is following admissions in August, May, April, March, February, and January 2022.     Interval History:   Still feels terrible all the time. Body aches, chills, fatigue, and pain. Pain is mostly in the legs and radiates down his legs. Has had several episodes recently where his legs have just given out and he has fallen. Currently taking Oxycodone 10mg  4-5 times a day. When he  takes     Has been back at work for about a year now. Hasn't had to do the physical part of his job bc he has people that respond with him.     Not currently taking Gabapentin.     Continues to feel down and hopeless.  Denies suicidal thoughts.     Palliative Performance Scale: 80% - Ambulation: Full / Normal Activity, some evidence of disease / Self-Care:Full / Intake: Normal / Level of Conscious: Full     Coping/Support Issues: Having a lot of difficulty coping with his current setback. Well supported by his wife. Also has a psychiatrist and therapist.     Goals of Care: Treatment and cancer-directed therapy     Social History:  Adam Keith, 2 daughters.  Occupation: Was in the [employer]	[place]  Hobbies: Woodworking  Current residence / distance from [facility]: [place]     Has a 55 year old son.      Advance Care Planning: Did not discuss this visit.        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    Onion Anaphylaxis    Vancomycin Analogues Other (See Comments)       Hearing loss with Lasix     Other reaction(s): Other (See Comments)     Hearing loss     lasix     Hearing loss      lasix      Hearing loss with Lasix Other reaction(s): Other (See Comments) Hearing loss lasix    Bismuth Subsalicylate Nausea And Vomiting    Privigen [Immun Glob G(Igg)-Pro-Iga 0-50] Other (See Comments)       03/10/2021 VIG infusion stopped as patient developed rigors, tachycardia and HTN during infusion despite pre-medication; 03/12/21 completed IVIG with premedications and slower rate of infusion    Furosemide Other (See Comments)       With Vancomycin caused hearing loss     Other reaction(s): Other (See Comments)     With Vancomycin caused hearing loss      With Vancomycin caused hearing loss Other reaction(s): Other (See Comments) With Vancomycin caused hearing loss    Gammagard           Family History:  Cancer-related family history includes Cancer in his maternal grandfather and mother. There is no history of Melanoma.  He indicated that the status of his mother is unknown. He indicated that the status of his father is unknown. He indicated that the status of his sister is unknown. He indicated that the status of his brother is unknown. He indicated that the status of his maternal grandmother is unknown. He indicated that the status of his maternal grandfather is unknown. He indicated that the status of his paternal grandmother is unknown. He indicated that the status of his paternal grandfather is unknown. He indicated that the status of his maternal aunt is unknown. He indicated that the status of his maternal uncle is unknown. He indicated that  the status of his paternal aunt is unknown. He indicated that the status of his paternal uncle is unknown. He indicated that the status of his neg hx is unknown.        REVIEW OF SYSTEMS:  A comprehensive review of 10 systems was negative except for pertinent positives noted in HPI.           Lab Results   Component Value Date     CREATININE 1.93 (H) 02/22/2022            Lab Results   Component Value Date     ALKPHOS 92 02/22/2022     BILITOT 0.4 02/22/2022     BILIDIR 0.20 02/07/2022     PROT 6.6 02/22/2022     ALBUMIN 4.1 02/22/2022     ALT 96 (H) 02/22/2022     AST 45 (H) 02/22/2022      Physical exam:  Gen: NAD  Pulm: nl WOB  Skin: Dry and red  Psych: Guarded and dysphoric      I personally spent 35 minutes face-to-face and non-face-to-face in the care of this patient, which includes all pre, intra, and post visit time on the date of service.     Kathlynn Grate, MD  [facility] Outpatient Oncology Palliative Care video call with the patient. I spent an additional 10 minutes on pre- and post-visit activities on the date of service .     Marita [place], MD  [facility]

## 2022-12-12 LAB — ANA: ANTINUCLEAR ANTIBODIES (ANA): NEGATIVE

## 2022-12-12 LAB — ANTI-NEUTROPHILIC CYTOPLASMIC ANTIBODY
ANCA IFA: NEGATIVE
MPO-ELISA: NEGATIVE
MPO-QUANT: 1.1 U/mL (ref <21.0)
PR3 ELISA: NEGATIVE
PR3-QUANT: 0.8 U/mL (ref <21.0)

## 2022-12-12 LAB — NORTRIPTYLINE LEVEL: NORTRIPTYLINE: 71 ng/mL

## 2022-12-13 NOTE — Unmapped (Signed)
Called patient's home to let them know that his nortriptyline level was at the lower end of the normal range and that he continues to be safe on the 100 mg dose. Patient recently started prednisone and he appears to be feeling a little better.

## 2022-12-14 LAB — VITAMIN D 25 HYDROXY: VITAMIN D, TOTAL (25OH): 21.1 ng/mL (ref 20.0–80.0)

## 2022-12-20 LAB — TESTOSTERONE, FREE, TOTAL
TESTOSTERONE (MAYO): 222 ng/dL — ABNORMAL LOW
TESTOSTERONE FREE: 4.76 ng/dL

## 2022-12-28 NOTE — Unmapped (Signed)
[facility]    Patient Name: Adam Keith  Patient Age: 42 y.o.  Encounter Date: 12/29/2022    Primary Care Provider:  Dierdre Harness, MD    Referring Physician:  [name], MD  [address]  [place],  [place] [address]    Cancer Diagnosis: Ph+ ALL; Initial Dx 01/2020  Cancer status: MRD-neg remission  Treatment Regimen: Third Line, ponatinib monotherapy   Treatment Goal: Curative  Comorbidities: COPD, OSA, DVT/PE  Transplant: Discussed, but did not want to proceed    Assessment:  A 42 y.o. 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 The course was complicated by septic shock, candida krusei fungemia, MRSA bacteremia with septic emboli c/b acute renal failure and respiratory distress requiring dialysis and intubation.  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  He subsequently had difficulty tolerating single agent dasatinib (as a bridge to planned ponatinib-blinatumomab--had fluid retention and pleural effusion).  He then completed 4 cycles of ponatinib-blinatumomab, initiated for treatment of MRD in the setting of poor tolerance of standard therapy.      He is currently on ponatinib 30 mg monotherapy (on monotherapy since 01/2021).  When we decreased his ponatinib to 15 mg, he had a rise in transcripts. Most recent BCR-ABL is + at 0.002. Now, BCR/ABL is negative on 30 mg. He feels strongly that he would want to stay on the 30 mg for now.     Plan and Recommendations:  Ph+ B-ALL : MRD-neg by PCR, hx of CNS ds   - Continue ponatinib 30 mg  - Continue aspirin while on ponatinib (also on apixaban)   - BCR-ABL q 3 months on peripheral blood     Fatigue with BL symmetric oligoarthritis of shoulders, hips and knees and increased inflammatory markers: Multifactorial etiology for fatigue. Could be related to known depression, COPD, OSA unable to tolerate CPAP, Vit D deficiency, prior infections and male hypogonadism. Rule out polymyalgia rheumatica vs other inflammatory processes vs side effects from TKI vs chronic infectious process. Improved with 15 mg of prednisone.   - Increase pred to 25 mg x 2 weeks, then re-evaluate   - Ref to rheumatology for further workup   - Repeat inflammatory markers next visit. ESR not available this visit.    Rule out male hypogonadism: Testosterone level low on 2 occasions. FSH/LH normal.  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   - No plan for further IVIG given several reactions   - Rec Flu and Covid vaccines     DRESS: followed by Dr. 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 Now s/p dialysis  - stable Cr appears to have developed CKD due to incompletely healed acute injury.  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 CT chest 02/06/22 with residual subacute thrombus.   - Indefinite anticoagulation with apixaban (in addition to asa given ponatinib)     Vitamin D Deficiency:  was low 07/2022.   - started supplementation 07/2022.   - He has not noticed any change in symptoms with it. Will recheck level in 1 month.     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 [name] and Dr. Verlan Friends and Dr. Maple Mirza following patient.    - long term doxycycline with goal of decolonization  **Hx Candidemia during induction - now s/p Cresemba  - followed by Dr. [name]  **Hx Covid-19 x 4 documented   - possible long-Covid symptoms    Symptom management: bowel regimen as listed in medication list, analgesics as listed in medication list or symptoms reviewed and due to acceptable control, no changes made  **Peripheral neuropathy, sensory and motor  - Grade 1-2 monitor  **Chronic Back Pain, complicated by hospital stay and procedures  - Followed by Dr. Genice Rouge, Palliative Care  **Depression/anxiety: following with [name], [facility]  - management per psychiatry     Venous access: I recommend the following change for venous access : PICC line removal      Supportive Care Recommendations:  We recommend based on the patient???s underlying diagnosis and treatment history the following supportive care:    1. Antimicrobial prophylaxis:   - Primary management per ICID  - On daily doxy for recurrent MRSA pna    - Viral - valacyclovir 500mg  daily  - Bacterial - levofloxacin 500 mg if ANC <0.5  - Fungal - previously on treatment isavuconazole - off now  - PJP - On Bactrim DS BID Sat/Sun  - recommend Annual flu shot - declines thus far  - recommend updated Covid vaccine - declines    2. Blood product support:  I recommend the following intervals for laboratory monitoring to determine transfusion needs and monitor for hematologic effects of therapy and underlying disease: no routine monitoring outside of clinic follow-up    Leukoreduced blood products are required.  Irradiated blood products are preferred, but in case of urgent transfusion needs non-irradiated blood products may be used:     -  RBC transfusion threshold: transfuse 2 units for Hgb < 8 g/dL.  -  Platelet transfusion threshold: transfuse 1 unit of platelets for platelet count < 10, or for bleeding or need for invasive procedure.    3. Hematopoietic growth factor support: none    Care coordination: The next [facility] Follow up requested:  - 2w followup video visit to readdress steroid need     Mariel Aloe, MD  Leukemia Program  Division of Hematology  [facility]     Nurse Navigator (non-clinical trial patients): Elicia Lamp, RN        Tel. [phone]       Fax. [phone]  Toll-free appointments: [phone]  Scheduling assistance: [phone]  After hours/weekends: [phone] (ask for adult hematology/oncology on-call)      History of Present Illness:  We had the pleasure of seeing Adam Keith in the Leukemia Clinic at the [facility] on 12/29/2022.  He is a 42  y.o. male with  Ph+ B-ALL .  He continues to complain of significant fatigue, predominantly at night but it usually lasts all day. He states having significant amount of pain in all his big muscle groups nocturnally and has constant pain in shoulders, hips and back. He has not noticed pain in his arms and denies morning stiffness of hands or other joints. He may have intermittent headaches that are usually located on the left side of his head and behind his eye, and notices some paresthesias on his face when chewing food but does not describe claudication as his symptoms. Currently he denies having a headache. He denies any fevers but admits having night sweats and chills overnight.        His oncologic history is as follows:    Hematology/Oncology History Overview Note   Referring/Local Oncologist: None    Diagnosis:Ph+ ALL    Genetics:    Karyotype/FISH:Abnormal Karyotype: 46,XY,t(9;22)(q34;q11.2)[1]/45,XY,der(7;9)(q10;q10)t(9;22)(q34;q11.2),der(22)t(9;22)[11]/46,sdl,+der(22)t(9;22)[5]/46,XY[3]     Abnormal FISH: A BCR/ABL1 interphase FISH assay shows an abnormal signal pattern in 97% of the 100 cells scored. 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  The findings support a diagnosis of leukemia and have implications for targeted therapy and for monitoring residual disease.      Molecular Genetics:  BCR-ABL1 p210 transcripts were detected at a level of 46.479 IS% ratio in bone marrow.  BCR-ABL1 p190 transcripts were detected at a level of 4 in 100,000 cells in bone marrow.    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)     01/24/2020 - 04/02/2020 Chemotherapy    IP/OP LEUKEMIA GRAAPH-2005 + RITUXIMAB < 60 YO  rituximab hypercvad (odd and even course)     02/24/2020 Remission    CR with PCR MRD+; marrow showing 70% blasts with <1% blasts, negative flow MRD, BCR-ABL p210 PCR 0.197%; CNS negative for blasts     03/09/2020 Progression    CSF - rare blast ID'ed - IT chemo given    03/13/20 - IT chemo, CSF negative     04/16/2020 Biopsy    BM Bx with 40-50% cellularity, <1% blasts, MRD flow cytometry negative, BCR-ABL p210 transcripts 0.036%     05/28/2020 - 05/28/2020 Chemotherapy    OP AML - CNS THERAPY (INTRATHECAL CYTARABINE, INTRATHECAL METHOTREXATE, OR INTRATHECAL TRIPLE)  Select one of the following: cytarabine IT 100 mg with hydrocortisone 50 mg, cytarabine IT 40 mg with methotrexate 15 mg with hydrocortisone 50 mg, OR methotrexate IT 12 mg with hydrocortisone 50 mg     06/19/2020 -  Chemotherapy    IP/OP LEUKEMIA BLINATUMOMAB 7-DAY INFUSION (MINIMAL RESIDUAL DISEASE; WT >= 22 KG) (HOME INFUSION)      Cycles 1*-4: Blinatumomab 28 mcg/day Days 1-28 of 6-week cycle.  *Given in the inpatient setting on Days 1-3 on Cycle 1 and Days 1-2 on Cycle 2, while other treatment days are given in the outpatient setting.    Cycle 1 MRD Dosing     07/18/2020 Adverse Reaction    Hospitalization: fevers. Pneumonia     07/23/2020 Biopsy    Diagnosis  Bone marrow, right iliac, aspiration and biopsy  -   Normocellular bone marrow (50%) with trilineage  hematopoiesis and 1% blasts by manual aspirate differential  -   Flow cytometry MRD analysis reveals no definitive immunophenotypic evidence of residual B lymphoblastic leukemia   - BCR-ABL p210 0.006%         08/10/2020 -  Chemotherapy    Cycle 2 blinatumomab-ponatinib  Ponatinib 30mg     1 IT per cycle     09/21/2020 Adverse Reaction    Covid-19, symptomatic but not requiring hospitalization.     10/06/2020 -  Chemotherapy    Cycle 3 blinatumomab-ponatinib  Ponatinib 30mg        11/01/2020 Adverse Reaction    Hospitalization: MRSA Pneumonia requiring intubation    Blinatumomab stopped.  Ponatinib held until he stabilized.       12/14/2020 -  Chemotherapy    Cycle 4 blinatumomab 28 mcg/day days 1-28, ponatinib 30 mg per day IT triple therapy day 29     01/13/2021 -  Chemotherapy    Ponatinib 30mg  daily  - due to due low level BCR-ABL     03/06/2021 Adverse Reaction    Hospitalization - ICU on ventilator - influenza and MRSA/klebsiella PNA     06/13/2022 -  Chemotherapy    OP LEUKEMIA VINCRISTINE  vinCRIStine 2 mg IV on day 1     ALL (acute lymphoblastic leukemia) (CMS-HCC)   06/11/2020 -  Chemotherapy    IP/OP LEUKEMIA BLINATUMOMAB 7-DAY INFUSION (MINIMAL RESIDUAL DISEASE; WT >= 22 KG) (HOME INFUSION)  Cycles 1*-4: Blinatumomab 28 mcg/day Days 1-28 of 6-week cycle.  *Given in the inpatient setting on Days 1-3 on Cycle 1 and Days 1-2 on Cycle 2, while other treatment days are given in the outpatient setting.     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  *Given in the inpatient setting on Days 1-3 on Cycle 1 and Days 1-2 on Cycle 2, while other treatment days are given in the outpatient setting.     11/09/2020 Initial Diagnosis    Acute lymphoblastic leukemia in remission (CMS-HCC)     06/13/2022 -  Chemotherapy    OP LEUKEMIA VINCRISTINE  vinCRIStine 2 mg IV on day 1           Past Medical, Surgical and Family History were reviewed and pertinent updates were made in the Electronic Medical Record      ECOG Performance Status: 1    Medications:      Current Outpatient Medications   Medication Sig Dispense Refill    albuterol HFA 90 mcg/actuation inhaler Inhale 2 puffs every six (6) hours as needed for wheezing. 18 g 0    amLODIPine (NORVASC) 10 MG tablet Take 1 tablet (10 mg total) by mouth daily. 30 tablet 0    apixaban (ELIQUIS) 5 mg Tab Take 1 tablet (5 mg total) by mouth two (2) times a day. 60 tablet 6    arm brace (WRIST BRACE) Misc 1 Piece by Miscellaneous route as needed. left ulnar wrist brace 1 each 0    carvediloL (COREG) 12.5 MG tablet Take 1 tablet (12.5 mg total) by mouth Two (2) times a day. 180 tablet 1  cholecalciferol, vitamin D3-250 mcg, 10,000 unit,, 250 mcg (10,000 unit) capsule Take 1 capsule (250 mcg total) by mouth daily. 120 capsule 3    docusate sodium (COLACE) 100 MG capsule Take 1 capsule (100 mg total) by mouth daily.      doxycycline (VIBRA-TABS) 100 MG tablet Take 1 tablet (100 mg total) by mouth two (2) times a day. 180 tablet 3    hydroCHLOROthiazide 12.5 MG tablet Take 1 tablet (12.5 mg total) by mouth daily. 30 tablet 11    mirtazapine (REMERON) 45 MG tablet TAKE 1 TABLET BY MOUTH NIGHTLY 90 tablet 4    naloxone (NARCAN) 4 mg nasal spray One spray in either nostril once for known/suspected opioid overdose. May repeat every 2-3 minutes in alternating nostril til EMS arrives (Patient not taking: Reported on 12/08/2022) 2 each 0    nortriptyline (PAMELOR) 50 MG capsule TAKE 2 CAPSULES BY MOUTH EVERY DAY AT NIGHT 180 capsule 4    omeprazole (PRILOSEC) 40 MG capsule Take 1 capsule (40 mg total) by mouth every morning.      ondansetron (ZOFRAN-ODT) 4 MG disintegrating tablet Take 1 tablet (4 mg total) by mouth every eight (8) hours as needed for nausea.      oxyCODONE (ROXICODONE) 10 mg immediate release tablet Take 1 tablet (10 mg total) by mouth every four (4) hours as needed for pain. 120 tablet 0    PONATinib (ICLUSIG) 30 mg tablet Take 1 tablet (30 mg total) by mouth daily. Swallow tablets whole. Do not crush, break, cut or chew tablets. 30 tablet 5    predniSONE (DELTASONE) 5 MG tablet Take 3 tablets (15 mg total) by mouth daily. 84 tablet 3    pregabalin (LYRICA) 50 MG capsule Take 1 capsule (50 mg total) by mouth Three (3) times a day. 90 capsule 3    spironolactone (ALDACTONE) 25 MG tablet TAKE 1 TABLET (25 MG TOTAL) BY MOUTH DAILY. 90 tablet 3    sulfamethoxazole-trimethoprim (BACTRIM DS) 800-160 mg per tablet Take 1 tablet (160 mg of trimethoprim total) by mouth two (2) times a day. TAKE 1 TABLET BY MOUTH 2 TIMES A DAY ON SATURDAY, SUNDAY. FOR PROPHYLAXIS 48 tablet 3    tiotropium-olodaterol (STIOLTO RESPIMAT) 2.5-2.5 mcg/actuation Mist Inhale 2 puffs daily. 4 g 3    tiotropium-olodaterol (STIOLTO RESPIMAT) 2.5-2.5 mcg/actuation Mist Inhale 2 puffs daily. 4 g 0    tiotropium-olodaterol (STIOLTO RESPIMAT) 2.5-2.5 mcg/actuation Mist Inhale 2 puffs daily. 4 g 0    valACYclovir (VALTREX) 500 MG tablet Take 1 tablet (500 mg total) by mouth daily. 90 tablet 3     No current facility-administered medications for this visit.       Vital Signs:  There were no vitals filed for this visit.    GENERAL: Well-appearing white man. NAD.   HEENT: Pupils equal, round. EOMI.   HEART: Normal color, not excessive pallor. Not ashen.   CHEST/LUNG: Normal work of breathing. No dyspnea with conversation.   EXTREMITIES: No edema, cyanosis or clubbing in viewed UE.   SKIN: No noticable rash or petechiae.   NEURO EXAM: Grossly intact.     MDM:  1 chronic life threatening illness  Drug therapy requiring intense monitoring for toxicity       The patient reports they are physically located in [place] and is currently: at home. I conducted a audio/video visit. I spent  45m 38s on the video call with the patient. I spent an additional 12 minutes on pre- and post-visit activities  on the date of service .

## 2022-12-28 NOTE — Unmapped (Signed)
[facility] Refill Coordination Note    Specialty Medication(s) to be Shipped:   Hematology/Oncology: Iclusig    Other medication(s) to be shipped: No additional medications requested for fill at this time     Adam Keith, DOB: September 06, 1980  Phone: [phone] (work)      All above HIPAA information was verified with patient.     Was a Nurse, learning disability used for this call? No    Completed refill call assessment today to schedule patient's medication shipment from the [facility] [phone]).  All relevant notes have been reviewed.     Specialty medication(s) and dose(s) confirmed: Regimen is correct and unchanged.   Changes to medications: [name] reports starting the following medications: Prednisone  Changes to insurance: No  New side effects reported not previously addressed with a pharmacist or physician: None reported  Questions for the pharmacist: No    Confirmed patient received a Conservation officer, historic buildings and a Surveyor, mining with first shipment. The patient will receive a drug information handout for each medication shipped and additional FDA Medication Guides as required.       DISEASE/MEDICATION-SPECIFIC INFORMATION        N/A    SPECIALTY MEDICATION ADHERENCE     Medication Adherence    Patient reported X missed doses in the last month: 0  Specialty Medication: Iclusig 30 mg  Patient is on additional specialty medications: No  Informant: patient     Were doses missed due to medication being on hold? No    Iclusig 30 mg: 10 days of medicine on hand       REFERRAL TO PHARMACIST     Referral to the pharmacist: Not needed      [facility]     Shipping address confirmed in Epic.     Delivery Scheduled: Yes, Expected medication delivery date: 01/04/23.     Medication will be delivered via UPS to the prescription address in Epic [place].    [name]   [facility] Specialty Technician

## 2022-12-29 ENCOUNTER — Telehealth: Admit: 2022-12-29 | Discharge: 2022-12-30 | Payer: MEDICAID | Attending: Hematology | Primary: Hematology

## 2022-12-29 DIAGNOSIS — C9101 Acute lymphoblastic leukemia, in remission: Principal | ICD-10-CM

## 2022-12-29 DIAGNOSIS — M1380 Other specified arthritis, unspecified site: Principal | ICD-10-CM

## 2022-12-29 MED ORDER — PREDNISONE 5 MG TABLET
ORAL_TABLET | Freq: Every day | ORAL | 3 refills | 16 days | Status: CP
Start: 2022-12-29 — End: 2023-04-20

## 2022-12-29 MED ORDER — TESTOSTERONE 1 % (50 MG/5 GRAM) TRANSDERMAL GEL PACKET
PACK | Freq: Every day | TRANSDERMAL | 3 refills | 90 days | Status: CP
Start: 2022-12-29 — End: 2023-12-24

## 2022-12-29 NOTE — Unmapped (Signed)
Pt confirmed PHONE visit and meds

## 2023-01-03 MED FILL — ICLUSIG 30 MG TABLET: ORAL | 30 days supply | Qty: 30 | Fill #5

## 2023-01-09 ENCOUNTER — Ambulatory Visit: Admit: 2023-01-09 | Discharge: 2023-01-10 | Payer: MEDICAID | Attending: Adult Health | Primary: Adult Health

## 2023-01-09 DIAGNOSIS — R5383 Other fatigue: Principal | ICD-10-CM

## 2023-01-09 DIAGNOSIS — E291 Testicular hypofunction: Principal | ICD-10-CM

## 2023-01-09 DIAGNOSIS — N529 Male erectile dysfunction, unspecified: Principal | ICD-10-CM

## 2023-01-09 LAB — LUTEINIZING HORMONE: LUTEINIZING HORMONE: 3.6 m[IU]/mL

## 2023-01-09 LAB — HEMATOCRIT: HEMATOCRIT: 48 % (ref 39.0–48.0)

## 2023-01-09 LAB — PROLACTIN: PROLACTIN: 14.8 ng/mL

## 2023-01-09 LAB — SEDIMENTATION RATE: ERYTHROCYTE SEDIMENTATION RATE: <1 mm/h (ref 0–15)

## 2023-01-09 LAB — TESTOSTERONE: TESTOSTERONE TOTAL: 38 ng/dL — ABNORMAL LOW

## 2023-01-09 LAB — ESTRADIOL(ESTROGEN) LEVEL: ESTRADIOL LEVEL: 30.8 pg/mL

## 2023-01-09 LAB — PSA: PROSTATE SPECIFIC ANTIGEN: 0.32 ng/mL (ref 0.00–4.00)

## 2023-01-09 MED ORDER — TESTOSTERONE 1 % (50 MG/5 GRAM) TRANSDERMAL GEL PACKET
PACK | Freq: Every day | TRANSDERMAL | 5 refills | 30 days | Status: CP
Start: 2023-01-09 — End: 2024-01-04

## 2023-01-09 NOTE — Unmapped (Signed)
Assessment:   Diagnosis ICD-10-CM Associated Orders   1. Fatigue, unspecified type  R53.83 Ambulatory referral to Urology     Testosterone     Estradiol (Estrogen) Level     Prolactin     Luteinizing hormone     PSA (Prostate Specific Antigen)     Hematocrit     Testosterone     Estradiol (Estrogen) Level     Prolactin     Luteinizing hormone     PSA (Prostate Specific Antigen)     Hematocrit      2. Erectile dysfunction, unspecified erectile dysfunction type  N52.9 Ambulatory referral to Urology      3. 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     Plan:  - hormone profile today (hematocrit, e2, prolactin, lh, t, psa)  - will send in testosterone gel; insurance will notify nursing if we need prior authorization (may require 2 am t levels)   - will trial viagra 100 mg once he consistently taking testosterone medication as this may naturally improve his erections.   We discussed he may take this 30 minutes to four hours prior to intercourse to help with erections.  This medication is best on an empty stomach.  We also discussed he may not takes nitrates for chest pain with this medication and that he should go to the ED if he has an erection lasting more than four hours.   - rtc 3 months with repeat labs   (briefly discussed ICI, but declined for now)     Counseling:    We discussed that options include observation with implementation of dietary/exercise strategies versus testosterone replacement (with gel, injections, pellets, etc) and alternative therapies such as Clomid and/or Arimidex to increase natural production. We also discussed that losing weight is a means of naturally improving testosterone levels. Studies suggest dietary modifications including drinking less alcohol, and eating a well-balanced diet with lots of vegetables can also help.  Also stress management and improved sleep habits were discussed.  I suggested a trial of 6 months of diet/exercise strategies prior to medical therapy.    We reviewed risks of TRT including but not limited to acne, fluid retention, BPH or urinary symptoms, breast enlargement, worsening of sleep apnea, infertility, changes in blood count requiring phlebotomy, changes in cholesterol requiring additional medical therapy, blood clots including deep venous thrombosis or thromboembolic events, and prostate cancer progression.  We also discussed recent literature suggesting an increase in cardiovascular events (including heart attack/stroke) and mortality, but we also reviewed that there is conflicting literature in this regard.  He understands these risks and wishes to proceed.  We also reviewed the AUA's Position Statement on testosterone therapy with him and the new FDA warning, and these statements were provided in writing.    We discussed that he will need to be followed at regular intervals for physical exam (including digital rectal exam), reassessment of symptoms, and lab work including PSA, hormone levels, and complete blood counts.  After being fully advised of these risks and expressing understanding, he would like to pursue therapy.  He was given a handout delineating the importance of regular follow-up and risks of testosterone replacement therapy, including but not limited to those noted above.           Referring Provider:  NP Dellis Anes    PCP:   Dr.  Sampson Goon     Reason for Visit:  ED/ Hypogonadism     HPI:   Adam Keith  is a pleasant 42 y.o. year-old male who is being seen today in consultation at the request of NP Dellis Anes for ED/ hypogonadism.  History of Ph + B-ALL.  After review of records, reported fatigue to oncology. Had T drawn and was found to be in the 200s.     He was prescribed testosterone 9/26, but states he hasn't been able to get the medication due to it needing prior authorization.     Using [facility], states he's tried cialis 5 mg and then most recently viagra 100 mg. Married monogamous. States last time he was able to have an erection was about two years ago. States he has desire, but certainly not able to even achieve a nocturnal erection.     Has extreme fatigue; barely feeling like he can get up out of bed.      Latest Reference Range & Units 12/08/22 11:04   Testosterone ([facility]) 240 - 950 ng/dL 846 (L)   (L): Data is abnormally low    Today, he reports that he is doing well. Today, he denies fever, chills, chest pain, shortness of breath, cough, wheezing, nausea, vomiting, abdominal pain, flank pain, diarrhea, constipation, dysuria, hematuria, rashes, open wounds, edema, and weakness or numbness in the upper or lower extremities.     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    Septic shock due to Staphylococcus aureus (CMS-HCC) 01/28/2020    Tear of medial meniscus of knee 10/25/2016    Testicular pain, left 01/21/2022       Past surgical history:  Past Surgical History:   Procedure Laterality Date    BONE MARROW BIOPSY & ASPIRATION  01/21/2020         CHG [place], CHEST,REAL TIME  11/20/2020    Procedure: ULTRASOUND, CHEST, REAL TIME WITH IMAGE DOCUMENTATION;  Surgeon: Jerelyn Charles, MD;  Location: [facility];  Service: Pulmonary    IR INSERT PORT AGE GREATER THAN 5 YRS  03/10/2020    IR INSERT PORT AGE GREATER THAN 5 YRS 03/10/2020 [name], MD IMG [facility]    PR BRONCHOSCOPY,DIAGNOSTIC W LAVAGE Bilateral 07/20/2020    Procedure: BRONCHOSCOPY, RIGID OR FLEXIBLE, INCLUDE FLUOROSCOPIC GUIDANCE WHEN PERFORMED; W/BRONCHIAL ALVEOLAR LAVAGE WITH MODERATE SEDATION;  Surgeon: Dellis Filbert, MD;  Location: [facility];  Service: Pulmonary       Social history:  Social History     Social History Narrative    Not on file   Licensed conveyancer     Family history:  family history includes Cancer in his maternal grandfather and mother; No Known Problems in his brother, father, maternal aunt, maternal grandmother, maternal uncle, paternal aunt, paternal grandfather, paternal grandmother, paternal uncle, and sister.    MEDICATIONS:   Current Outpatient Medications   Medication Sig Dispense Refill    albuterol HFA 90 mcg/actuation inhaler Inhale 2 puffs every six (6) hours as needed for wheezing. 18 g 0    amLODIPine (NORVASC) 10 MG tablet Take 1 tablet (10 mg total) by mouth daily. 30 tablet 0  apixaban (ELIQUIS) 5 mg Tab Take 1 tablet (5 mg total) by mouth two (2) times a day. 60 tablet 6    arm brace (WRIST BRACE) Misc 1 Piece by Miscellaneous route as needed. left ulnar wrist brace 1 each 0    carvediloL (COREG) 12.5 MG tablet Take 1 tablet (12.5 mg total) by mouth Two (2) times a day. 180 tablet 1    cholecalciferol, vitamin D3-250 mcg, 10,000 unit,, 250 mcg (10,000 unit) capsule Take 1 capsule (250 mcg total) by mouth daily. 120 capsule 3    docusate sodium (COLACE) 100 MG capsule Take 1 capsule (100 mg total) by mouth daily.      doxycycline (VIBRA-TABS) 100 MG tablet Take 1 tablet (100 mg total) by mouth two (2) times a day. 180 tablet 3    hydroCHLOROthiazide 12.5 MG tablet Take 1 tablet (12.5 mg total) by mouth daily. 30 tablet 11    mirtazapine (REMERON) 45 MG tablet TAKE 1 TABLET BY MOUTH NIGHTLY 90 tablet 4    naloxone (NARCAN) 4 mg nasal spray One spray in either nostril once for known/suspected opioid overdose. May repeat every 2-3 minutes in alternating nostril til EMS arrives 2 each 0    nortriptyline (PAMELOR) 50 MG capsule TAKE 2 CAPSULES BY MOUTH EVERY DAY AT NIGHT 180 capsule 4    omeprazole (PRILOSEC) 40 MG capsule Take 1 capsule (40 mg total) by mouth every morning.      ondansetron (ZOFRAN-ODT) 4 MG disintegrating tablet Take 1 tablet (4 mg total) by mouth every eight (8) hours as needed for nausea.      oxyCODONE (ROXICODONE) 10 mg immediate release tablet Take 1 tablet (10 mg total) by mouth every four (4) hours as needed for pain. 120 tablet 0    PONATinib (ICLUSIG) 30 mg tablet Take 1 tablet (30 mg total) by mouth daily. Swallow tablets whole. Do not crush, break, cut or chew tablets. 30 tablet 5    predniSONE (DELTASONE) 5 MG tablet Take 5 tablets (25 mg total) by mouth daily. 84 tablet 3    pregabalin (LYRICA) 50 MG capsule Take 1 capsule (50 mg total) by mouth Three (3) times a day. 90 capsule 3    spironolactone (ALDACTONE) 25 MG tablet TAKE 1 TABLET (25 MG TOTAL) BY MOUTH DAILY. 90 tablet 3    sulfamethoxazole-trimethoprim (BACTRIM DS) 800-160 mg per tablet Take 1 tablet (160 mg of trimethoprim total) by mouth two (2) times a day. TAKE 1 TABLET BY MOUTH 2 TIMES A DAY ON SATURDAY, SUNDAY. FOR PROPHYLAXIS 48 tablet 3    testosterone 1 % (50 mg/5 gram) gel packet Place 1 packet (50 mg of testosterone total) on the skin daily. 30 packet 5    tiotropium-olodaterol (STIOLTO RESPIMAT) 2.5-2.5 mcg/actuation Mist Inhale 2 puffs daily. 4 g 3    tiotropium-olodaterol (STIOLTO RESPIMAT) 2.5-2.5 mcg/actuation Mist Inhale 2 puffs daily. 4 g 0    tiotropium-olodaterol (STIOLTO RESPIMAT) 2.5-2.5 mcg/actuation Mist Inhale 2 puffs daily. 4 g 0    valACYclovir (VALTREX) 500 MG tablet Take 1 tablet (500 mg total) by mouth daily. 90 tablet 3     No current facility-administered medications for this visit.       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    Onion Anaphylaxis    Vancomycin Analogues Other (See Comments)     Hearing loss with Lasix    Other reaction(s): Other (See Comments)    Hearing loss    lasix    Hearing loss      lasix      Hearing loss with Lasix Other reaction(s): Other (See Comments) Hearing loss lasix    Bismuth Subsalicylate Nausea And Vomiting    Privigen [Immun Glob G(Igg)-Pro-Iga 0-50] Other (See Comments)     03/10/2021 VIG infusion stopped as patient developed rigors, tachycardia and HTN during infusion despite pre-medication; 03/12/21 completed IVIG with premedications and slower rate of infusion    Furosemide Other (See Comments)     With Vancomycin caused hearing loss    Other reaction(s): Other (See Comments)    With Vancomycin caused hearing loss      With Vancomycin caused hearing loss Other reaction(s): Other (See Comments) With Vancomycin caused hearing loss    Gammagard        Review of Systems:  Positive for fatigue.  Otherwise, a 10 system ROS questionnaire completed by the patient and reviewed by me was normal.    Physical Exam  Vitals:    01/09/23 0923   BP: 135/84   Pulse: 103   Resp: 20   SpO2: 95%   Weight: (!) 128.4 kg (283 lb)   Height: 185.4 cm (6' 1)     GENERAL: The patient is a pleasant, male in no acute distress.   HEENT: Normocephalic and atraumatic.   NECK: Supple with trachea midline.   LYMPHATICS: No cervical or supraclavicular lymphadenopathy.   PULMONARY: Lungs are clear to auscultation without wheezes, rales or rhonchi. Relaxed respiratory effort on room air.   CARDIOVASCULAR: Regular rate and rhythm without murmurs, rubs or gallops. Normal S1, S2.   GASTROINTESTINAL: Soft, nontender and nondistended with bowel sounds equal throughout. No CVA tenderness.   GENITOURINARY: Circumcised penis with bilaterally descended testicles. No testicular masses or tenderness.   SKIN: No signs of cyanosis or clubbing.   NEUROLOGICAL: Grossly intact.   PSYCH: Alert and oriented x 3.

## 2023-01-09 NOTE — Unmapped (Signed)
Hypogonadism - Patient Information  Matt Coward, MD      Introduction  Hypogonadism is a condition defined by low serum testosterone levels and symptoms such as fatigue, decreased libido, erectile dysfunction, weakness, and weight gain. It is known to occur with aging, as most men have declining testosterone levels beginning in their 30???s, but it can also be associated with particular medical conditions. Treatment is usually undertaken when these underlying conditions are causing the issue, rather than just low levels associated with aging. Treatment is important for these men because, in addition to symptoms, low testosterone levels have been associated with decreased muscle mass and strength, osteoporosis, depression, decreased cognition, ED, and metabolic syndrome. Testosterone replacement therapy (TRT) has been shown to increase lean body mass, improve bone mineral density, increase cognitive performance, and improve sexual function.     Diagnosis  Currently, there is no single consensus statement regarding diagnosis and management of hypogonadism. In general, the diagnosis requires a low serum testosterone level measured in the morning hours coupled with at least one clinical symptom of low testosterone. Absolute ranges of normal testosterone levels are difficult to establish. Therefore, treatment is generally geared toward improvement of clinical symptoms rather than an absolute serum testosterone level.     Treatment options  There are many options for TRT, each of which has its benefits and disadvantages. The decision about which one is right for you will depend on your personal preferences and a discussion with Dr. Coward. In some cases, different insurance companies may cover one option and not another, which may also be taken into consideration. If the desired effects are not achieved with your initial choice, a different option can be tried to see if it is a better fit for you. A summary of the most commonly used TRT options is provided below.    Topical Gels: Advantages include more constant levels with daily dosing, high patient satisfaction, and avoidance of needles. Disadvantages include increased cost compared to injectables, the potential for transference of the gel to others (e.g., spouses and young children) through contact with your skin or clothes, messiness of gel application, and potential skin irritation.     Injectables: Advantages include efficacy and patient satisfaction, weekly to biweekly dosing, and low cost. Disadvantages include increased fluctuation (peaks and valleys) in testosterone levels compared to daily dosing options and the requirement for needles and self-injection. There is a higher risk of elevation of the hematocrit (red blood cell count) which would require dosing changes or blood donation.    Implantable (Testopel): The advantages of this therapy include convenience and decreased frequency of dosing. As this requires a short office procedure, there are risks including bleeding, infection, and pellet extrusion in less than 1% of cases.    Monitoring  Regardless of the type of testosterone replacement therapy chosen, you will need to be monitored at regular intervals (usually every 3-6 months); both to confirm good control of your symptoms and to ensure that there are no potentially dangerous side effects. The follow-up regimen usually consists of the following:  1. Physical examination, including digital rectal exam to rule out prostate nodules (yearly).  2. Routine blood work for testosterone levels and other hormones (every 3-6 months).  3. Routine blood work for lipids, hemoglobin and hematocrit, and PSA (prostate-specific antigen) (every 6 months).    Risks  Historically, the concern for TRT was its effect on the prostate. Despite evidence that the prostate does enlarge slightly on TRT, no studies have shown any significant   worsening of urinary symptoms while on therapy. Studies have also demonstrated no significant change in PSA while on therapy. An increasing PSA while on TRT may indicate underlying malignancy and warrants evaluation. There has been no increased risk of prostate cancer demonstrated with TRT. Additionally, studies have demonstrated no increased risk of recurrence in men on TRT after undergoing treatment for prostate cancer. Small studies of men with active prostate cancer have shown no progression of disease on TRT. Until larger studies are published, TRT should be avoided in men with known or suspected active prostate cancer.    Another major risk to be aware of is the almost certain risk to a man???s fertility. Men of reproductive age or men who may want to preserve their fertility for the future should not use testosterone. There are other medications and treatment options for these men, so be sure to discuss this with Dr. Coward if you are concerned about preserving your fertility. Testosterone almost uniformly lowers sperm counts, sometimes to zero counts, and may irreversibly affect a man???s fertility.        Understanding the Cardiovascular Risk of Testosterone Replacement  Abraham Morgentaler, MD. The Journal of Sexual Medicine Volume 11, Issue 6, Article first published online: 07 Sep 2012    Two recent articles published in medical journals have suggested that men who received testosterone prescriptions were at greater risk of developing cardiovascular problems such as heart attack, stroke, and even death. Although these articles prompted a great amount of media attention, after careful scientific review many prominent experts on testosterone therapy have concluded that neither of these studies provides any definite evidence of cardiac risk from testosterone therapy. On the contrary, a rich literature spanning more than 30 years strongly suggests that increased risk of cardiovascular events is associated with low levels of testosterone; furthermore, testosterone therapy may provide cardiovascular benefits. Despite this, all patients should know that, based on these studies, the FDA has released a warning that there is a possible increased cardiovascular risk associated with testosterone use.    The first article was published in the Journal of the American Medical Association (JAMA) in November 2013 [1]. It analyzed data obtained from men who had undergone cardiac catheterization for possible heart problems, and identified men with low levels of testosterone. Some of those men eventually received a prescription for testosterone, and others did not. The investigators looked back at several years of data on these groups to determine rates of heart attacks, strokes, and deaths. They reported that although there was no significant difference in rates of these events at 1 year, 2 years, or 3 years, the overall trend over the course of the study showed a 29% increase in men who received testosterone prescriptions.    Strangely, the actual percentage of individuals who had any of these CV events was lower by half among the men who received T therapy (10.1%) compared with untreated men (21.2%). The investigators came to the opposite conclusion by complex statistics in which an event in a testosterone-treated man was given more ???weight??? than the same event in an untreated man. Moreover, the authors improperly excluded a large number of men who started testosterone after they had a heart attack or other event. If those men had been included in the ???no T??? group (which would have been appropriate as they were not taking T at the time of the event), the study would likely have concluded that men who received testosterone were less likely to have cardiac events or strokes.   The validity of this study has been questioned further due to new revelations of large errors in data. A large group of world experts have called for retraction of the study, concluding that these errors make the study ???no longer credible.    The second article was published in the journal PLoS One in January 2014 [2]. It analyzed health insurance data such as diagnosis codes and prescriptions, and reported that men who received a testosterone prescription had a greater risk of non-fatal heart attack in the short period (up to 90 days) after receiving the prescription than in the prior 12 months. Although the media made a big deal out of this study, the actual increased risk was just over 1 event for every 1,000 years of testosterone use. Such a tiny number is meaningless. More importantly, since the information was not obtained as part of any experiment, it is impossible to know whether this miniscule increase in risk was due to the prescription, or the underlying condition of low testosterone, or to other factors, such as age or smoking.    Cardiovascular Safety  Two previous studies have shown that men with low levels of testosterone who received treatment had a reduction in mortality by half compared with untreated men [3,4]. Numerous studies have shown that men with low levels of testosterone die sooner than men with normal testosterone levels [5]. Placebo-controlled studies have shown that men with congestive heart failure treated with testosterone were able to walk farther during testing than men who received placebo. Men with known coronary artery disease who received testosterone therapy were able to exercise longer on a treadmill without chest pain than men who received placebo. Increased ability to exercise may help prevent heart problems from progressing in some men. In addition, testosterone therapy improves risk factors for heart disease, such as reducing fat mass, lowering blood sugar, and reducing waist circumference. Although definitive large studies are yet to be completed, there is a great deal of evidence which strongly suggests that having a normal testosterone level, naturally or with T therapy, is beneficial for cardiovascular health.    If the Studies Linking T to Heart Problems Were So Weak, Why Did They Receive So Much Attention?  Unfortunately, there are many reasons why journal articles receive media attention, and it is not always on the strength of the science. Sensational headlines about risks of death or disease are always of interest, particularly when they are about a therapy (such as testosterone) that many people are using or are interested in using.    References:  1 Vigen R, O???Donnell CI, Bar??n AE, Grunwald GK, Maddox TM, Bradley SM, Barqawi A, Woning G, Wierman ME, Plomondon ME, Rumsfeld JS, Ho PM. Association of testosterone therapy with mortality, myocardial infarction, and stroke in men with low  testosterone levels. JAMA 2013;310:1829-36.  2 Finkle WD, Greenland S, Ridgeway GK, Adams JL, Frasco MA, Cook MB, Fraumeni JF Jr, Hoover RN. Increased risk of non-fatal myocardial infarction following testosterone therapy prescriptions in men. PLoS ONE 2014;9:e85805.  3 Shores MM, Smith NL, Forsberg CW, Anawalt BD, Matsumoto AM. Testosterone treatment and mortality in men with low testosterone levels. J Clin Endocrinol Metab 2012;97:2050-8.  4 Muraleedharan V, Marsh H, Kapoor D, Channers KS, Jones TH. Testosterone deficiency is associated with increased risk of mortality and testosterone  replacement improves survival in men with type 2 diabetes. Eur J Endocrinol 2013  ;169:725-33.  5 Traish AM, Miner MM, Morgentaler A, Zitzmann M. Testosterone deficiency. Am J Med 2011;123:578-87.  6   Khera M, Crawford D, Morales A, Salonia A, Morgentaler A. A new era of testosterone and prostate cancer: From physiology to clinical implications. Eur Urol 2014;65:115-23.      The AUA Position Statement On Testosterone Therapy (February 2014)  https://www.auanet.org/about/testosterone-therapy.cfm    The American Urological Association (AUA) has followed closely the recent media attention regarding reports that testosterone therapy increases cardiovascular events in men as well as the FDA's stated intent to review cardiovascular risk with this treatment in men with hypogonadism. The AUA notes there is also contradictory evidence suggesting a beneficial influence of testosterone therapy on cardiovascular risk. Definitive studies have not been performed.    The AUA is also concerned about the potential for misuse of testosterone for non-medical indications, such as body building or performance enhancement.    Hypogonadism is defined as biochemically low testosterone levels in the setting of a cluster of symptoms which may include reduced sexual desire (libido) and activity, decreased spontaneous erections, decreased energy and depressed mood. Men with hypogonadism may also experience reduced muscle mass and strength and increased body fat. Hypogonadism may also contribute to reduced bone mineral density and anemia. Testosterone therapy is an appropriate treatment for hypogonadism after full discussion of potential adverse effects. Treatment requires follow-up and medical monitoring. Testosterone therapy in the absence of hypogonadism is not appropriate.    Increased awareness about hypogonadism has been stimulated by an increase in availability and diversity of patient-acceptable forms of testosterone replacement options in recent years. The management of hypogonadism should start with careful evaluation by a physician experienced in diagnosing hypogonadism. Many of the symptoms are non-specific and may be multifactorial in origin. Hence, symptoms may not be necessarily linked to hypogonadism alone. This fact needs to be considered in the overall evaluation.    The diagnosis and management of testosterone deficiency should be made by a physician with training in the condition and its treatments. The diagnosis should be made only after taking detailed medical history, physical examination, and obtaining appropriate blood tests. Testosterone therapy should not be offered to men with normal testosterone levels. Testosterone therapy is never a treatment for infertility.    The potential adverse effects of testosterone therapy should be discussed prior to treatment. These include acne, breast swelling or tenderness, increased red blood cell count, swelling of the feet or ankles, reduced testicular size and infertility. Current evidence does not provide any definitive answers regarding the risks of testosterone therapy on prostate cancer and cardiovascular disease, and patients should be so informed.    The optimal follow-up of men on testosterone therapy has not been defined, but should include measurement of testosterone level, PSA and hematocrit. Other patient-specific measures may be appropriate.    The AUA recognizes and encourages the need for increased educational awareness of the benefits and risks of testosterone therapy among both patients and health care providers.      FDA Drug Safety Communication: FDA cautions about using testosterone products for low testosterone due to aging; requires labeling change to inform of possible increased risk of heart attack and stroke with use (06/04/2013)    The U.S. Food and Drug Administration (FDA) cautions that prescription testosterone products are approved only for men who have low testosterone levels caused by certain medical conditions. The benefit and safety of these medications have not been established for the treatment of low testosterone levels due to aging, even if a man???s symptoms seem related to low testosterone. We are requiring that the manufacturers of all approved prescription testosterone products   change their labeling to clarify the approved uses of these medications. We are also requiring these manufacturers to add information to the labeling about a possible increased risk of heart attacks and strokes in patients taking testosterone. Health care professionals should prescribe testosterone therapy only for men with low testosterone levels caused by certain medical conditions and confirmed by laboratory tests.    Testosterone is FDA-approved as replacement therapy only for men who have low testosterone levels due to disorders of the testicles, pituitary gland, or brain that cause a condition called hypogonadism. Examples of these disorders include failure of the testicles to produce testosterone because of genetic problems, or damage from chemotherapy or infection. However, FDA has become aware that testosterone is being used extensively in attempts to relieve symptoms in men who have low testosterone for no apparent reason other than aging. The benefits and safety of this use have not been established.    In addition, based on the available evidence from published studies and expert input from an Advisory Committee meeting, FDA has concluded that there is a possible increased cardiovascular risk associated with testosterone use. These studies included aging men treated with testosterone. Some studies reported an increased risk of heart attack, stroke, or death associated with testosterone treatment, while others did not.    Based on our findings, we are requiring labeling changes for all prescription testosterone products to reflect the possible increased risk of heart attacks and strokes associated with testosterone use. Health care professionals should make patients aware of this possible risk when deciding whether to start or continue a patient on testosterone therapy. We are also requiring manufacturers of approved testosterone products to conduct a well-designed clinical trial to more clearly address the question of whether an increased risk of heart attack or stroke exists among users of these products. We are encouraging these manufacturers to work together on a clinical trial, but they are allowed to work separately if they so choose.    Patients using testosterone should seek medical attention immediately if symptoms of a heart attack or stroke are present, such as:    Chest pain  Shortness of breath or trouble breathing  Weakness in one part or one side of the body  Slurred speech

## 2023-01-16 ENCOUNTER — Telehealth: Admit: 2023-01-16 | Discharge: 2023-01-17 | Payer: MEDICAID | Attending: Adult Health | Primary: Adult Health

## 2023-01-16 DIAGNOSIS — C9100 Acute lymphoblastic leukemia not having achieved remission: Principal | ICD-10-CM

## 2023-01-16 DIAGNOSIS — R6889 Other general symptoms and signs: Principal | ICD-10-CM

## 2023-01-16 DIAGNOSIS — R61 Generalized hyperhidrosis: Principal | ICD-10-CM

## 2023-01-16 MED ORDER — OXYCODONE 10 MG TABLET
ORAL_TABLET | ORAL | 0 refills | 20 days | Status: CP | PRN
Start: 2023-01-16 — End: ?

## 2023-01-16 NOTE — Unmapped (Signed)
Reviewed Meds and Allergies, confirmed pharmacy and informed how to use MyChart video visit

## 2023-01-16 NOTE — Unmapped (Signed)
I left a voicemail for patient Adam Keith to confirm appointments on the following date(s):     01/16/23 video with Drenda Freeze

## 2023-01-16 NOTE — Unmapped (Signed)
A prior authorization for testosterone gel (Androgel) was submitted via CoverMyMeds to patient's insurance on file: medicaid.

## 2023-01-16 NOTE — Unmapped (Signed)
[facility]    Patient Name: Adam Keith  Patient Age: 42 y.o.  Encounter Date: 01/16/2023    Primary Care Provider:  Dierdre Harness, MD    Referring Physician:  Dario Ave  [address]  [id number] [facility] [place],  [place] [address]    Cancer Diagnosis: Ph+ ALL; Initial Dx 01/2020  Cancer status: MRD-neg remission  Treatment Regimen: Third Line, ponatinib monotherapy   Treatment Goal: Curative  Comorbidities: COPD, OSA, DVT/PE  Transplant: Discussed, but did not want to proceed    Assessment:  A 42 y.o. 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 The course was complicated by septic shock, candida krusei fungemia, MRSA bacteremia with septic emboli c/b acute renal failure and respiratory distress requiring dialysis and intubation.  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  He subsequently had difficulty tolerating single agent dasatinib (as a bridge to planned ponatinib-blinatumomab--had fluid retention and pleural effusion).  He then completed 4 cycles of ponatinib-blinatumomab, initiated for treatment of MRD in the setting of poor tolerance of standard therapy.      He is currently on ponatinib 30 mg monotherapy (on monotherapy since 01/2021).  When we decreased his ponatinib to 15 mg, he had a rise in transcripts. Now, BCR/ABL is negative on 30 mg. He feels strongly that he would want to stay on the 30 mg for now.     Today's visit is regarding worsening symptoms s/p start of prednisone (at the last visit). He has developed heavy sweats/chills (even rigors a couple of days ago), which wouldn't be expected on prednisone. I would have expected that would prevent that symptom. His disease has been well controlled, but will collect a CBC/d to re-evaluate. I discussed with Dr. Genice Rouge, Palliative Care, who had started Lyrica at that same time as the prednisone. These symptoms would not be expected with Lyrica either. He has a complex infectious history and Dr. [name] recommended an infectious workup. It is possible his blood glucose is elevated due to prednisone. He hasn't checked at home (does not have a meter), so a glucose level will be helpful. He will get labs collected in the next 1-2 days. I also recommend tapering of the prednisone, since the prednisone did not help his symptoms and may have exacerbated symptoms.     Plan and Recommendations:  Ph+ B-ALL : MRD-neg by PCR, hx of CNS ds   - Continue ponatinib 30 mg  - Continue aspirin while on ponatinib (also on apixaban)   - BCR-ABL q 3 months on peripheral blood     Fatigue with BL symmetric oligoarthritis of shoulders, hips and knees and increased inflammatory markers: Multifactorial etiology for fatigue. Could be related to known depression, COPD, OSA unable to tolerate CPAP, Vit D deficiency, prior infections and male hypogonadism. Rule out polymyalgia rheumatica vs other inflammatory processes vs side effects from TKI vs chronic infectious process. Improved with 15 mg of prednisone.   - TAPER pred     ---20 mg x 3 days  ---15 mg x 5 days  --- 10 mg x 5 days  --- 5 mg x 5 days  - Rheumatology appt in February - recommended asking if he could be added to the cancellation list in case there is an opening  - Repeat inflammatory markers next  visit. ESR not available this visit.    Rule out male hypogonadism: Testosterone level low on 2 occasions. FSH/LH normal.  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   - No plan for further IVIG given several reactions   - Rec Flu and Covid vaccines     DRESS: followed by Dr. 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 Now s/p dialysis  - stable Cr appears to have developed CKD due to incompletely healed acute injury.  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 CT chest 02/06/22 with residual subacute thrombus.   - Indefinite anticoagulation with apixaban (in addition to asa given ponatinib)     Vitamin D Deficiency: was low 07/2022.   - started supplementation 07/2022.   - He has not noticed any change in symptoms with it. Will recheck level in 1 month.     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 [name] and Dr. Verlan Friends and Dr. Maple Mirza following patient.    - long term doxycycline with goal of decolonization  **Hx Candidemia during induction - now s/p Cresemba  - followed by Dr. [name]  **Hx Covid-19 x 4 documented   - possible long-Covid symptoms    Symptom management: bowel regimen as listed in medication list, analgesics as listed in medication list or symptoms reviewed and due to acceptable control, no changes made  **Peripheral neuropathy, sensory and motor  - Grade 1-2 monitor  **Chronic Back Pain, complicated by hospital stay and procedures  - Followed by Dr. Genice Rouge, Palliative Care  **Depression/anxiety: following with [name], [facility]  - management per psychiatry     Venous access: I recommend the following change for venous access : PICC line removal      Supportive Care Recommendations:  We recommend based on the patient???s underlying diagnosis and treatment history the following supportive care:    1. Antimicrobial prophylaxis:   - Primary management per ICID  - On daily doxy for recurrent MRSA pna    - Viral - valacyclovir 500mg  daily  - Bacterial - levofloxacin 500 mg if ANC <0.5  - Fungal - previously on treatment isavuconazole - off now  - PJP - On Bactrim DS BID Sat/Sun  - recommend Annual flu shot - declines thus far  - recommend updated Covid vaccine - declines    2. Blood product support:  I recommend the following intervals for laboratory monitoring to determine transfusion needs and monitor for hematologic effects of therapy and underlying disease: no routine monitoring outside of clinic follow-up    Leukoreduced blood products are required.  Irradiated blood products are preferred, but in  case of urgent transfusion needs non-irradiated blood products may be used:     -  RBC transfusion threshold: transfuse 2 units for Hgb < 8 g/dL.  -  Platelet transfusion threshold: transfuse 1 unit of platelets for platelet count < 10, or for bleeding or need for invasive procedure.    3. Hematopoietic growth factor support: none    Care coordination: The next [facility] Follow up requested:      Mariel Aloe, MD  Leukemia Program  Division of Hematology  [facility]     Nurse Navigator (non-clinical trial patients): Elicia Lamp, RN        Tel. [phone]       Fax. [phone]  Toll-free appointments: [phone]  Scheduling assistance: [phone]  After hours/weekends: [phone] (ask for adult hematology/oncology on-call)      History of Present Illness:  We had the pleasure of seeing Priyam Woodards in the Leukemia Clinic at the [place] [facility] [place] on 01/16/2023.  He is a 42 y.o. male with  Ph+ B-ALL .        Interim History:    This visit is earlier than scheduled, due to worsening symptoms since starting 25mg  of prednisone.    The first couple of days on steroids (15mg ) he felt good. But then his symptoms returned to baseline. He didn't feel any improvement with 25mg  dosing.    He then began to develop heavy sweats. Reports a history of night sweats, but now they are occurring during the day. Most days the sweats start around 1pm. He can sweat through 2-3 shirts and sometimes his pant (but the sweating is mostly upper body). He feels chilled during this like an allergic reaction. A couple of days ago he had shakes (likely rigors) that he couldn't stop for a while. No fevers during this time.  He takes the prednisone around 10 am, after a meal.  He hasn't checked his blood glucose, but could at work if needed.  Only other change at that time was starting Lyrica.  No other supplements or new medications.  He is using more oxycodone and wants to discuss with Dr. Genice Rouge possibly increasing the dose. He will run out prior to Wednesday.        His oncologic history is as follows:    Hematology/Oncology History Overview Note   Referring/Local Oncologist: None    Diagnosis:Ph+ ALL    Genetics:    Karyotype/FISH:Abnormal Karyotype: 46,XY,t(9;22)(q34;q11.2)[1]/45,XY,der(7;9)(q10;q10)t(9;22)(q34;q11.2),der(22)t(9;22)[11]/46,sdl,+der(22)t(9;22)[5]/46,XY[3]     Abnormal FISH: A BCR/ABL1 interphase FISH assay shows an abnormal signal pattern in 97% of the 100 cells scored. 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  The findings support a diagnosis of leukemia and have implications for targeted therapy and for monitoring residual disease.      Molecular Genetics:  BCR-ABL1 p210 transcripts were detected at a level of 46.479 IS% ratio in bone marrow.  BCR-ABL1 p190 transcripts were detected at a level of 4 in 100,000 cells in bone marrow.    Pertinent Phenotypic data:    Disease-specific prognostic estimate: High Risk, Ph+ Acute lymphoblastic leukemia (ALL) not having achieved remission (CMS-HCC)   01/23/2020 Initial Diagnosis    Acute lymphoblastic leukemia (ALL) not having achieved remission (CMS-HCC)     01/24/2020 - 04/02/2020 Chemotherapy    IP/OP LEUKEMIA GRAAPH-2005 + RITUXIMAB < 60 YO  rituximab hypercvad (odd and even  course)     02/24/2020 Remission    CR with PCR MRD+; marrow showing 70% blasts with <1% blasts, negative flow MRD, BCR-ABL p210 PCR 0.197%; CNS negative for blasts     03/09/2020 Progression    CSF - rare blast ID'ed - IT chemo given    03/13/20 - IT chemo, CSF negative     04/16/2020 Biopsy    BM Bx with 40-50% cellularity, <1% blasts, MRD flow cytometry negative, BCR-ABL p210 transcripts 0.036%     05/28/2020 - 05/28/2020 Chemotherapy    OP AML - CNS THERAPY (INTRATHECAL CYTARABINE, INTRATHECAL METHOTREXATE, OR INTRATHECAL TRIPLE)  Select one of the following: cytarabine IT 100 mg with hydrocortisone 50 mg, cytarabine IT 40 mg with methotrexate 15 mg with hydrocortisone 50 mg, OR methotrexate IT 12 mg with hydrocortisone 50 mg     06/19/2020 -  Chemotherapy    IP/OP LEUKEMIA BLINATUMOMAB 7-DAY INFUSION (MINIMAL RESIDUAL DISEASE; WT >= 22 KG) (HOME INFUSION)      Cycles 1*-4: Blinatumomab 28 mcg/day Days 1-28 of 6-week cycle.  *Given in the inpatient setting on Days 1-3 on Cycle 1 and Days 1-2 on Cycle 2, while other treatment days are given in the outpatient setting.    Cycle 1 MRD Dosing     07/18/2020 Adverse Reaction    Hospitalization: fevers. Pneumonia     07/23/2020 Biopsy    Diagnosis  Bone marrow, right iliac, aspiration and biopsy  -   Normocellular bone marrow (50%) with trilineage hematopoiesis and 1% blasts by manual aspirate differential  -   Flow cytometry MRD analysis reveals no definitive immunophenotypic evidence of residual B lymphoblastic leukemia   - BCR-ABL p210 0.006%         08/10/2020 -  Chemotherapy    Cycle 2 blinatumomab-ponatinib  Ponatinib 30mg     1 IT per cycle     09/21/2020 Adverse Reaction    Covid-19, symptomatic but not requiring hospitalization.     10/06/2020 -  Chemotherapy    Cycle 3 blinatumomab-ponatinib  Ponatinib 30mg        11/01/2020 Adverse Reaction    Hospitalization: MRSA Pneumonia requiring intubation    Blinatumomab stopped.  Ponatinib held until he stabilized.       12/14/2020 -  Chemotherapy    Cycle 4 blinatumomab 28 mcg/day days 1-28, ponatinib 30 mg per day IT triple therapy day 29     01/13/2021 -  Chemotherapy    Ponatinib 30mg  daily  - due to due low level BCR-ABL     03/06/2021 Adverse Reaction    Hospitalization - ICU on ventilator - influenza and MRSA/klebsiella PNA     06/13/2022 -  Chemotherapy    OP LEUKEMIA VINCRISTINE  vinCRIStine 2 mg IV on day 1     ALL (acute lymphoblastic leukemia) (CMS-HCC)   06/11/2020 -  Chemotherapy    IP/OP LEUKEMIA BLINATUMOMAB 7-DAY INFUSION (MINIMAL RESIDUAL DISEASE; WT >= 22 KG) (HOME INFUSION)  Cycles 1*-4: Blinatumomab 28 mcg/day Days 1-28 of 6-week cycle.  *Given in the inpatient setting on Days 1-3 on Cycle 1 and Days 1-2 on Cycle 2, while other treatment days are given in the outpatient setting.     09/21/2020 Initial Diagnosis    ALL (acute lymphoblastic leukemia) (CMS-HCC)     06/13/2022 -  Chemotherapy    OP LEUKEMIA VINCRISTINE  vinCRIStine 2 mg IV on day 1     Acute lymphoblastic leukemia in remission (CMS-HCC)   06/11/2020 -  Chemotherapy    IP/OP  LEUKEMIA BLINATUMOMAB 7-DAY INFUSION (MINIMAL RESIDUAL DISEASE; WT >= 22 KG) (HOME INFUSION)  Cycles 1*-4: Blinatumomab 28 mcg/day Days 1-28 of 6-week cycle.  *Given in the inpatient setting on Days 1-3 on Cycle 1 and Days 1-2 on Cycle 2, while other treatment days are given in the outpatient setting.     11/09/2020 Initial Diagnosis    Acute lymphoblastic leukemia in remission (CMS-HCC)     06/13/2022 -  Chemotherapy    OP LEUKEMIA VINCRISTINE  vinCRIStine 2 mg IV on day 1           Past Medical, Surgical and Family History were reviewed and pertinent updates were made in the Electronic Medical Record      ECOG Performance Status: 1    Medications:      Current Outpatient Medications   Medication Sig Dispense Refill    albuterol HFA 90 mcg/actuation inhaler Inhale 2 puffs every six (6) hours as needed for wheezing. 18 g 0    amLODIPine (NORVASC) 10 MG tablet Take 1 tablet (10 mg total) by mouth daily. 30 tablet 0    apixaban (ELIQUIS) 5 mg Tab Take 1 tablet (5 mg total) by mouth two (2) times a day. 60 tablet 6    arm brace (WRIST BRACE) Misc 1 Piece by Miscellaneous route as needed. left ulnar wrist brace 1 each 0    carvediloL (COREG) 12.5 MG tablet Take 1 tablet (12.5 mg total) by mouth Two (2) times a day. 180 tablet 1    cholecalciferol, vitamin D3-250 mcg, 10,000 unit,, 250 mcg (10,000 unit) capsule Take 1 capsule (250 mcg total) by mouth daily. 120 capsule 3    docusate sodium (COLACE) 100 MG capsule Take 1 capsule (100 mg total) by mouth daily.      doxycycline (VIBRA-TABS) 100 MG tablet Take 1 tablet (100 mg total) by mouth two (2) times a day. 180 tablet 3    hydroCHLOROthiazide 12.5 MG tablet Take 1 tablet (12.5 mg total) by mouth daily. 30 tablet 11    mirtazapine (REMERON) 45 MG tablet TAKE 1 TABLET BY MOUTH NIGHTLY 90 tablet 4    naloxone (NARCAN) 4 mg nasal spray One spray in either nostril once for known/suspected opioid overdose. May repeat every 2-3 minutes in alternating nostril til EMS arrives 2 each 0    nortriptyline (PAMELOR) 50 MG capsule TAKE 2 CAPSULES BY MOUTH EVERY DAY AT NIGHT 180 capsule 4    omeprazole (PRILOSEC) 40 MG capsule Take 1 capsule (40 mg total) by mouth every morning.      ondansetron (ZOFRAN-ODT) 4 MG disintegrating tablet Take 1 tablet (4 mg total) by mouth every eight (8) hours as needed for nausea.      PONATinib (ICLUSIG) 30 mg tablet Take 1 tablet (30 mg total) by mouth daily. Swallow tablets whole. Do not crush, break, cut or chew tablets. 30 tablet 5    predniSONE (DELTASONE) 5 MG tablet Take 5 tablets (25 mg total) by mouth daily. 84 tablet 3 pregabalin (LYRICA) 50 MG capsule Take 1 capsule (50 mg total) by mouth Three (3) times a day. 90 capsule 3    spironolactone (ALDACTONE) 25 MG tablet TAKE 1 TABLET (25 MG TOTAL) BY MOUTH DAILY. 90 tablet 3    sulfamethoxazole-trimethoprim (BACTRIM DS) 800-160 mg per tablet Take 1 tablet (160 mg of trimethoprim total) by mouth two (2) times a day. TAKE 1 TABLET BY MOUTH 2 TIMES A DAY ON SATURDAY, SUNDAY. FOR PROPHYLAXIS 48 tablet 3  testosterone 1 % (50 mg/5 gram) gel packet Place 1 packet (50 mg of testosterone total) on the skin daily. 30 packet 5    tiotropium-olodaterol (STIOLTO RESPIMAT) 2.5-2.5 mcg/actuation Mist Inhale 2 puffs daily. 4 g 3    tiotropium-olodaterol (STIOLTO RESPIMAT) 2.5-2.5 mcg/actuation Mist Inhale 2 puffs daily. 4 g 0    tiotropium-olodaterol (STIOLTO RESPIMAT) 2.5-2.5 mcg/actuation Mist Inhale 2 puffs daily. 4 g 0    valACYclovir (VALTREX) 500 MG tablet Take 1 tablet (500 mg total) by mouth daily. 90 tablet 3    oxyCODONE (ROXICODONE) 10 mg immediate release tablet Take 1 tablet (10 mg total) by mouth every four (4) hours as needed for pain. 120 tablet 0     No current facility-administered medications for this visit.       Vital Signs:  There were no vitals filed for this visit.    GENERAL: Well-appearing white man. NAD.   HEENT: Pupils equal, round. EOMI.   HEART: Normal color, not excessive pallor. Not ashen.   CHEST/LUNG: Normal work of breathing. No dyspnea with conversation.   EXTREMITIES: No edema, cyanosis or clubbing in viewed UE.   SKIN: No noticable rash or petechiae.   NEURO EXAM: Grossly intact.     MDM:  1 chronic life threatening illness  Drug therapy requiring intense monitoring for toxicity       The patient reports they are physically located in [place] and is currently: at home. I conducted a audio/video visit. I spent  26m 43s on the video call with the patient. I spent an additional 15 minutes on pre- and post-visit activities on the date of service .

## 2023-01-17 NOTE — Unmapped (Signed)
Please see the MyChart message I sent you.

## 2023-01-19 ENCOUNTER — Telehealth
Admit: 2023-01-19 | Discharge: 2023-01-20 | Payer: MEDICAID | Attending: Student in an Organized Health Care Education/Training Program | Primary: Student in an Organized Health Care Education/Training Program

## 2023-01-19 DIAGNOSIS — R5383 Other fatigue: Principal | ICD-10-CM

## 2023-01-19 DIAGNOSIS — Z515 Encounter for palliative care: Principal | ICD-10-CM

## 2023-01-19 DIAGNOSIS — C9100 Acute lymphoblastic leukemia not having achieved remission: Principal | ICD-10-CM

## 2023-01-19 DIAGNOSIS — F419 Anxiety disorder, unspecified: Principal | ICD-10-CM

## 2023-01-19 DIAGNOSIS — F32A Depression, unspecified depression type: Principal | ICD-10-CM

## 2023-01-19 DIAGNOSIS — G893 Neoplasm related pain (acute) (chronic): Principal | ICD-10-CM

## 2023-01-19 DIAGNOSIS — C9101 Acute lymphoblastic leukemia, in remission: Principal | ICD-10-CM

## 2023-01-19 NOTE — Unmapped (Signed)
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 He was recently admitted to [facility] with lowe extremity pain and edema. Has had episodic fluctuations in mood.  Has decided not to pursue bone marrow transplant at this time. Has felt chronically ill for the last year.  Unclear unfortunately exactly what is contributing to this, like the combination of treatment side effects and refractory depression.  Appreciate oncology and psychiatry assistance in management.     Assessment/Plan:      # Depressed Mood: Chronic depression, continues to feel down due to constitutional fatigue, pain, inability to go outside and do things that bring meaning to him.  While he has felt down, he does state his mood has been stable for the most part; denies SI/HI.  Has made plan to taper off of prednisone with oncology.  Hopeful that this will improve constitutional symptoms and also improve mood.  Continues to follow with [name]  -Medications per [name]        #Pain: Chronic, persistent pain, primarily in joints. Associated with/exacerbated by sitting too long, though he also says that activity seems to make it worst. Previously trialed butrans patch but did not find this effective. He feels that current oxycodone regimen helps keep him functional.  MRI of the spine showed narrowing in the lumbar region likely causing sciatica-like pain.    -Continue Lyrica 50 mg p.o. 3 times daily.  Will consider increase in the future, but no adjustments today.  - oxycodone 10 mg q4 prn for breakthrough pain, using 4-5 times a day.  Last refill 10/14, none needed today      #Persistent productive cough, ENT infections, low grade fevers  -Not addressed in detail today      # LUE pain: Ongoing.   - Pain management as above     #Chronic pain: At baseline, see above  - Pain management as above     #Advance care planning: Continues to prioritize quality of life.  After discussion with infectious disease as well as oncology has made the decision not to pursue another bone marrow transplant at this time.  Reviewed previously if he is considering not continuing cancer directed therapy.  He states that despite how poorly he has been feeling he is not at that stage yet.     #Controlled substances risk management:  Patient does not have a signed pain medication agreement with our team.  [place] database was reviewed today and it was appropriate.  Urine drug screen was not performed at this visit. Findings: not applicable.  Patient has received information about safe storage and administration of medications.  Patient has not received a prescription for narcan; is not applicable.      F/u: 4-6 weeks     ----------------------------------------  Referring Provider: Dr. Berline Lopes  Oncology Team: Malignant hematology team  PCP: [name], MD     HPI: 42 year old man with a diagnosis of B-ALL, diagnosed in October 2021, now in complete remission. Recently admitted to the [facility] ICU 03/06/21-03/20/21 for AHRF d/t MRSA/klebsiella PNA, requiring intubation. This is following admissions in August, May, April, March, February, and January 2022.     Interval History:   Still super frustrated from how bad he is feeling physically. Talked about how the only way he can motivate himself to get out of bed is to go to work, He feels better  when he is able to do that.     Having a lot of disgusting burps. All afternoon after he takes his meds. Still having a daily BM, every night. Doesn't gett he urge, has to make himself go. Still having a decent amount of pain. Taking Oxycodone every 2 hours, so 5 times a day on average. Takes the edge off but doesn't take it away. Has been taking Lyrica 50mg  tid, feels like this helps take the edge off too. Pain worse with standing for more than 5 minutes.        Palliative Performance Scale: 80% - Ambulation: Full / Normal Activity, some evidence of disease / Self-Care:Full / Intake: Normal / Level of Conscious: Full     Coping/Support Issues: Having a lot of difficulty coping with his current setback. Well supported by his wife. Also has a psychiatrist and therapist.     Goals of Care: Treatment and cancer-directed therapy     Social History:  Name of primary support: [name], 2 daughters.  Occupation: Was in the [employer]	[place]  Hobbies: Woodworking  Current residence / distance from [facility]: [place]     Has a 16 year old son.      Advance Care Planning: Did not discuss this visit.        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    Onion Anaphylaxis    Vancomycin Analogues Other (See Comments)       Hearing loss with Lasix     Other reaction(s): Other (See Comments)     Hearing loss     lasix     Hearing loss      lasix      Hearing loss with Lasix Other reaction(s): Other (See Comments) Hearing loss lasix    Bismuth Subsalicylate Nausea And Vomiting    Privigen [Immun Glob G(Igg)-Pro-Iga 0-50] Other (See Comments)       03/10/2021 VIG infusion stopped as patient developed rigors, tachycardia and HTN during infusion despite pre-medication; 03/12/21 completed IVIG with premedications and slower rate of infusion    Furosemide Other (See Comments)       With Vancomycin caused hearing loss     Other reaction(s): Other (See Comments)     With Vancomycin caused hearing loss      With Vancomycin caused hearing loss Other reaction(s): Other (See Comments) With Vancomycin caused hearing loss    Gammagard           Family History:  Cancer-related family history includes Cancer in his maternal grandfather and mother. There is no history of Melanoma.  He indicated that the status of his mother is unknown. He indicated that the status of his father is unknown. He indicated that the status of his sister is unknown. He indicated that the status of his brother is unknown. He indicated that the status of his maternal grandmother is unknown. He indicated that the status of his maternal grandfather is unknown. He indicated that the status of his  paternal grandmother is unknown. He indicated that the status of his paternal grandfather is unknown. He indicated that the status of his maternal aunt is unknown. He indicated that the status of his maternal uncle is unknown. He indicated that the status of his paternal aunt is unknown. He indicated that the status of his paternal uncle is unknown. He indicated that the status of his neg hx is unknown.        REVIEW OF SYSTEMS:  A comprehensive review of 10 systems was negative except for pertinent positives noted in HPI.           Lab Results   Component Value Date     CREATININE 1.93 (H) 02/22/2022            Lab Results   Component Value Date     ALKPHOS 92 02/22/2022     BILITOT 0.4 02/22/2022     BILIDIR 0.20 02/07/2022     PROT 6.6 02/22/2022     ALBUMIN 4.1 02/22/2022     ALT 96 (H) 02/22/2022     AST 45 (H) 02/22/2022      Physical exam:  Gen: NAD  Pulm: nl WOB  Skin: Dry and red  Psych: Guarded and dysphoric      The patient reports they are physically located in [place] and is currently: at home. I conducted a audio/video visit. I spent  64m 35s on the video call with the patient. I spent an additional 15 minutes on pre- and post-visit activities on the date of service .       Kathlynn Grate, MD  [facility] Outpatient Oncology Palliative Care

## 2023-01-23 MED ORDER — PREDNISONE 5 MG TABLET
ORAL_TABLET | ORAL | 0 refills | 13 days | Status: CP
Start: 2023-01-23 — End: 2023-02-05

## 2023-01-24 ENCOUNTER — Ambulatory Visit: Admit: 2023-01-24 | Discharge: 2023-01-25 | Payer: MEDICAID

## 2023-01-24 DIAGNOSIS — C9101 Acute lymphoblastic leukemia, in remission: Principal | ICD-10-CM

## 2023-01-24 LAB — CBC W/ AUTO DIFF
BASOPHILS ABSOLUTE COUNT: 0.1 10*9/L (ref 0.0–0.1)
BASOPHILS RELATIVE PERCENT: 0.5 %
EOSINOPHILS ABSOLUTE COUNT: 2.2 10*9/L — ABNORMAL HIGH (ref 0.0–0.5)
EOSINOPHILS RELATIVE PERCENT: 12 %
HEMATOCRIT: 46 % (ref 39.0–48.0)
HEMOGLOBIN: 15.3 g/dL (ref 12.9–16.5)
LYMPHOCYTES ABSOLUTE COUNT: 3.4 10*9/L (ref 1.1–3.6)
LYMPHOCYTES RELATIVE PERCENT: 18.3 %
MEAN CORPUSCULAR HEMOGLOBIN CONC: 33.3 g/dL (ref 32.0–36.0)
MEAN CORPUSCULAR HEMOGLOBIN: 30.2 pg (ref 25.9–32.4)
MEAN CORPUSCULAR VOLUME: 90.5 fL (ref 77.6–95.7)
MEAN PLATELET VOLUME: 9.3 fL (ref 6.8–10.7)
MONOCYTES ABSOLUTE COUNT: 0.7 10*9/L (ref 0.3–0.8)
MONOCYTES RELATIVE PERCENT: 3.9 %
NEUTROPHILS ABSOLUTE COUNT: 11.9 10*9/L — ABNORMAL HIGH (ref 1.8–7.8)
NEUTROPHILS RELATIVE PERCENT: 65.3 %
NUCLEATED RED BLOOD CELLS: 0 /100{WBCs} (ref <=4)
PLATELET COUNT: 185 10*9/L (ref 150–450)
RED BLOOD CELL COUNT: 5.08 10*12/L (ref 4.26–5.60)
RED CELL DISTRIBUTION WIDTH: 17.1 % — ABNORMAL HIGH (ref 12.2–15.2)
WBC ADJUSTED: 18.3 10*9/L — ABNORMAL HIGH (ref 3.6–11.2)

## 2023-01-24 LAB — COMPREHENSIVE METABOLIC PANEL
ALBUMIN: 3.4 g/dL (ref 3.4–5.0)
ALKALINE PHOSPHATASE: 93 U/L (ref 46–116)
ALT (SGPT): 49 U/L (ref 10–49)
ANION GAP: 5 mmol/L (ref 5–14)
AST (SGOT): 21 U/L (ref <=34)
BILIRUBIN TOTAL: 0.3 mg/dL (ref 0.3–1.2)
BLOOD UREA NITROGEN: 18 mg/dL (ref 9–23)
BUN / CREAT RATIO: 11
CALCIUM: 9.2 mg/dL (ref 8.7–10.4)
CHLORIDE: 110 mmol/L — ABNORMAL HIGH (ref 98–107)
CO2: 26.6 mmol/L (ref 20.0–31.0)
CREATININE: 1.64 mg/dL — ABNORMAL HIGH
EGFR CKD-EPI (2021) MALE: 53 mL/min/{1.73_m2} — ABNORMAL LOW (ref >=60)
GLUCOSE RANDOM: 100 mg/dL (ref 70–179)
POTASSIUM: 3.8 mmol/L (ref 3.4–4.8)
PROTEIN TOTAL: 5.6 g/dL — ABNORMAL LOW (ref 5.7–8.2)
SODIUM: 142 mmol/L (ref 135–145)

## 2023-01-24 LAB — FOLLICLE STIMULATING HORMONE: FOLLICLE STIMULATING HORMONE: 18.8 m[IU]/mL

## 2023-01-24 LAB — C-REACTIVE PROTEIN: C-REACTIVE PROTEIN: 7 mg/L (ref <=10.0)

## 2023-01-24 LAB — LUTEINIZING HORMONE: LUTEINIZING HORMONE: 4.1 m[IU]/mL

## 2023-01-24 LAB — SEDIMENTATION RATE: ERYTHROCYTE SEDIMENTATION RATE: <1 mm/h (ref 0–15)

## 2023-01-24 LAB — CRYPTOCOCCAL ANTIGEN, SERUM: CRYPTOCOCCAL ANTIGEN: NEGATIVE

## 2023-01-24 LAB — CMV DNA, QUANTITATIVE, PCR: CMV VIRAL LD: NOT DETECTED

## 2023-01-24 MED ORDER — PONATINIB 30 MG TABLET
ORAL_TABLET | Freq: Every day | ORAL | 5 refills | 30 days | Status: CP
Start: 2023-01-24 — End: ?
  Filled 2023-02-23: qty 30, 30d supply, fill #0

## 2023-01-28 LAB — HISTO/BLASTO ANTIGEN, URINE
HISTOPLASMA/BLASTOMYCES AG RESULT: NOT DETECTED
HISTOPLASMA/BLASTOMYCES AG VALUE: NOT DETECTED ng/mL

## 2023-01-29 LAB — TESTOSTERONE, FREE, TOTAL
TESTOSTERONE (MAYO): 120 ng/dL — ABNORMAL LOW
TESTOSTERONE FREE: 2.91 ng/dL — ABNORMAL LOW

## 2023-02-01 MED ORDER — TESTOSTERONE 20.25 MG/1.25 GRAM PER PUMP ACT.(1.62 %) TRANSDERMAL GEL
Freq: Every day | TRANSDERMAL | 5 refills | 0 days | Status: CP
Start: 2023-02-01 — End: ?

## 2023-02-01 NOTE — Unmapped (Signed)
Call place to Medicaid to follow up on prior authorization for Testosterone. Daisha from [facility] reports patient needs to try and fail Androgel pump before approving the packets. Message sent to [employer] to clarify and update prescription. Patient messaged with update.    Reference # [id number]

## 2023-02-02 MED ORDER — TESTOSTERONE 20.25 MG/1.25 GRAM PER PUMP ACT.(1.62 %) TRANSDERMAL GEL
Freq: Every day | TRANSDERMAL | 5 refills | 0 days | Status: CP
Start: 2023-02-02 — End: ?

## 2023-02-06 NOTE — Unmapped (Signed)
IMMUNOCOMPROMISED HOST INFECTIOUS DISEASE PROGRESS NOTE    The patient reports they are physically located in [place] and is currently: at home. I conducted a audio/video visit. I spent 14 minutes on the video call with the patient. I spent an additional 36 minutes on pre- and post-visit activities on the date of service .         Assessment/Plan:     [name] is a 42 y.o. 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 krusei fungemia  - Prior chemotherapy: [id number]-2005 induction with dasatinib (vincristine, dexamethasone and dasatinib); C1D1 01/24/2020; difficulty tolerating single-agent dasatinib  - Current chemotherapy: blinatumomab (anti-CD19/CD3) + ponatinib (TKI) last in Oct 2022; 01/2021 ponatinib monotherapy maintenance  - 01/11/21: Normocellular bone marrow (30% overall) with trilineage hematopoiesis and less than 1% blasts by manual aspirate differential; Flow cytometry MRD analysis reveals no definitive immunophenotypic evidence of residual B lymphoblastic leukemia; BCR-ABL p210 transcripts were detected at a level of 0.002 IS % ratio in bone marrow.  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 s/p mica+azole until 05/13/20  # Possible post-COVID-19 associated fungal infection 07/17/20 s/p isavuconazole until 12/03/20  # Recurrent nasal congestion/ L maxillary sinusitis in setting of L posterior and R anterior septal deviation, 10/11/21  # Influenza A, 05/04/21  # Otitis media with left mastoid effusion, 06/2022   # COVID-19 infection 05/28/2020, 09/21/2020, 10/18/2021, 05/31/22 (s/p molnupirovir)    Active infections:    # Recurrent, severe MRSA infections with intubation; on doxycycline prophylaxis since 03/26/21  - 01/27/20 MRSA bacteremia + PNA + TV IE; 11/01/2020 MRSA RLL PNA c/b bacteremia/right empyema; 11/18/2020 RML MRSA pneumonia; 03/05/21 MRSA pneumonia after influenza infection requiring ICU admission/intubation  - 07/01/22 CT chest: Unchanged scarring and architectural distortion in the right upper lung. Upper lung-predominant emphysema. Trace tracheal debris. No pleural effusion or pneumothorax.    # Night sweats 01/2023, low grade fever 02/07/2023  - difficulty weaning off of prednisone, but off in end Oct 2024; started testosterone 02/03/23  - 10/2022 cortisol 12.1, thyroid nl, low testosterone, ANA, RF, ANCA studies neg  - 01/24/23 ESR<1, CRP 7, bd cx x 2 neg, crAg neg, histo Ag neg, CMV PCR ND     Antimicrobial Intolerance/allergy  Cefepime - DRESS/DiHS  Ceftaroline - DRESS/DiHS  Dapsone - possible agranulocytosis, per chart anaphylaxis  Vancomycin - probable ototoxicity (in combination with furosemide)  Isavuconazole - elevated LFTs in the setting of TKI  Linezolid - peripheral neuropathy; previously tolerated tedizolid  Aztreonam+linezolid - patient reported ear pain and worsening hearing (in the setting COVID-19)      RECOMMENDATIONS    Diagnosis  Coming off prednisone and starting testosterone, if fever still persists in a month then reasonable to obtain PET CT for FUO workup    Management  Continue doxycycline 100mg  bid indefinitely  Has OSA but cannot tolerate CPAP - encouraged investigating implantable Inspire device --from ID standpoint benefit outweighs risk   Notes GERD control is improved but still could consider bricks under head of bed    Antimicrobial prophylaxis required for hematological malignancy   Continue valacyclovir 500mg  daily  Continue tmp-smx 2 DS bid on S/S  Declined COVID 19 vaccine and flu vaccines previously. Discussed Pemgarda. Reenforced early diagnosis and treatment of COVID-19 and influenza infection    Intensive toxicity monitoring for prescription antimicrobials   NA    Follow up 6 months          I personally spent 40 minutes face-to-face and non-face-to-face in the care of this patient, which includes all pre, intra, and post visit time on the date of service.  All documented time was specific to the E/M visit and does not include any procedures that may have been performed. Time spent in H&P, chart review, charting, and communicating with other providers and spouse.     [name], MD  [facility] Division of Infectious Diseases    Subjective     External record(s): Primary team note: having night sweats, feels better on steroids Consultant note(s): struggling with chronic pain and feeling poorly, burping; working with urology to get on testosterone .    Independent historian(s): no independent historian required.       Interval History:   Started testosterone 4 days  Low grade fever for the last few nights - started with the steroids  Off prednisone now - for over a week  Burping has stopped - was never burning or irritating.   Sleeps flat. No sure if still having apneic events  Doing ok from ENT perspective - sinuses and ears not bothering him    Medications:    Current Outpatient Medications:     albuterol HFA 90 mcg/actuation inhaler, Inhale 2 puffs every six (6) hours as needed for wheezing., Disp: 18 g, Rfl: 0    amLODIPine (NORVASC) 10 MG tablet, Take 1 tablet (10 mg total) by mouth daily., Disp: 30 tablet, Rfl: 0    apixaban (ELIQUIS) 5 mg Tab, Take 1 tablet (5 mg total) by mouth two (2) times a day., Disp: 60 tablet, Rfl: 6    arm brace (WRIST BRACE) Misc, 1 Piece by Miscellaneous route as needed. left ulnar wrist brace, Disp: 1 each, Rfl: 0    carvediloL (COREG) 12.5 MG tablet, Take 1 tablet (12.5 mg total) by mouth Two (2) times a day., Disp: 180 tablet, Rfl: 1    cholecalciferol, vitamin D3-250 mcg, 10,000 unit,, 250 mcg (10,000 unit) capsule, Take 1 capsule (250 mcg total) by mouth daily., Disp: 120 capsule, Rfl: 3    docusate sodium (COLACE) 100 MG capsule, Take 1 capsule (100 mg total) by mouth daily., Disp: , Rfl:     doxycycline (VIBRA-TABS) 100 MG tablet, Take 1 tablet (100 mg total) by mouth two (2) times a day., Disp: 180 tablet, Rfl: 3    hydroCHLOROthiazide 12.5 MG tablet, Take 1 tablet (12.5 mg total) by mouth  daily., Disp: 30 tablet, Rfl: 11    mirtazapine (REMERON) 45 MG tablet, TAKE 1 TABLET BY MOUTH NIGHTLY, Disp: 90 tablet, Rfl: 4    naloxone (NARCAN) 4 mg nasal spray, One spray in either nostril once for known/suspected opioid overdose. May repeat every 2-3 minutes in alternating nostril til EMS arrives, Disp: 2 each, Rfl: 0    nortriptyline (PAMELOR) 50 MG capsule, TAKE 2 CAPSULES BY MOUTH EVERY DAY AT NIGHT, Disp: 180 capsule, Rfl: 4    omeprazole (PRILOSEC) 40 MG capsule, Take 1 capsule (40 mg total) by mouth every morning., Disp: , Rfl:     ondansetron (ZOFRAN-ODT) 4 MG disintegrating tablet, Take 1 tablet (4 mg total) by mouth every eight (8) hours as needed for nausea., Disp: , Rfl:     oxyCODONE (ROXICODONE) 10 mg immediate release tablet, Take 1 tablet (10 mg total) by mouth every four (4) hours as needed for pain., Disp: 120 tablet, Rfl: 0    PONATinib (ICLUSIG) 30 mg tablet, Take 1 tablet (30 mg total) by mouth daily. Swallow tablets whole. Do not crush, break, cut or chew tablets., Disp: 30 tablet, Rfl: 5    pregabalin (LYRICA) 50 MG capsule, Take 1 capsule (50 mg total) by mouth Three (3) times a day., Disp: 90 capsule, Rfl: 3    spironolactone (ALDACTONE) 25 MG tablet, TAKE 1 TABLET (25 MG TOTAL) BY MOUTH DAILY., Disp: 90 tablet, Rfl: 3    sulfamethoxazole-trimethoprim (BACTRIM DS) 800-160 mg per tablet, Take 1 tablet (160 mg of trimethoprim total) by mouth two (2) times a day. TAKE 1 TABLET BY MOUTH 2 TIMES A DAY ON SATURDAY, SUNDAY. FOR PROPHYLAXIS, Disp: 48 tablet, Rfl: 3    testosterone (ANDROGEL) 20.25 mg/1.25 gram (1.62 %) gel pump, Place 2 sprays (40.5 mg total) on the skin daily., Disp: 75 g, Rfl: 5    tiotropium-olodaterol (STIOLTO RESPIMAT) 2.5-2.5 mcg/actuation Mist, Inhale 2 puffs daily., Disp: 4 g, Rfl: 3    tiotropium-olodaterol (STIOLTO RESPIMAT) 2.5-2.5 mcg/actuation Mist, Inhale 2 puffs daily., Disp: 4 g, Rfl: 0    tiotropium-olodaterol (STIOLTO RESPIMAT) 2.5-2.5 mcg/actuation Mist, Inhale 2 puffs daily., Disp: 4 g, Rfl: 0    valACYclovir (VALTREX) 500 MG tablet, Take 1 tablet (500 mg total) by mouth daily., Disp: 90 tablet, Rfl: 3    Objective     Vital signs:  There were no vitals taken for this visit.    Physical Exam:  Const [x]  vital signs above    [x]  NAD, non-toxic appearance []  Chronically ill-appearing, non-distressed        Eyes [x]  Lids normal bilaterally, conjunctiva anicteric and noninjected OU     [] PERRL  [] EOMI        ENMT [x]  Normal appearance of external nose and ears, no nasal discharge        [x]  MMM, no lesions on lips or gums []  No thrush, leukoplakia, oral lesions  []  Dentition good []  Edentulous []  Dental caries present  []  Hearing normal  []  TMs with good light reflexes bilaterally   Decreased hearing      Neck []  Neck of normal appearance and trachea midline        []  No thyromegaly, nodules, or tenderness   []  Full neck ROM        Lymph []  No LAD in neck     []  No LAD in supraclavicular area     []  No LAD in axillae   []  No LAD in epitrochlear chains     []   No LAD in inguinal areas        CV []  RRR            []  No peripheral edema     []  Pedal pulses intact   []  No abnormal heart sounds appreciated   []  Extremities WWP         Resp [x]  Normal WOB at rest    [x]  No breathlessness with speaking, no coughing  []  CTA anteriorly    []  CTA posteriorly          GI []  Normal inspection, NTND   []  NABS     []  No umbilical hernia on exam       []  No hepatosplenomegaly     []  Inspection of perineal and perianal areas normal        GU []  Normal external genitalia     [] No urinary catheter present in urethra   []  No CVA tenderness    []  No tenderness over renal allograft        MSK []  No clubbing or cyanosis of hands       []  No vertebral point tenderness  []  No focal tenderness or abnormalities on palpation of joints in RUE, LUE, RLE, or LLE        Skin [x]  No rashes, lesions, or ulcers of visualized skin     []  Skin warm and dry to palpation         Neuro []  Face expression symmetric  []  Sensation to light touch grossly intact throughout    []  Moves extremities equally    []  No tremor noted        []  CNs II-XII grossly intact     []  DTRs normal and symmetric throughout []  Gait unremarkable        Psych [x]  Appropriate affect       [x]  Fluent speech         [x]  Attentive, good eye contact  []  Oriented to person, place, time          []  Judgment and insight are appropriate   He seemed less flat today then other visits        Data for Medical Decision Making       I discussed mgm't w/qualified health care professional(s) involved in case: plan with oncology team` .    I reviewed CBC results (wbc elevated after steroids), chemistry results (Cr stable), micro result(s) (review, no positives), and CRP/ESR normal .    I independently visualized/interpreted not done.       Results in Past 30 Days  Result Component Current Result Ref Range Previous Result Ref Range   Absolute Eosinophils 2.2 (H) (01/24/2023) 0.0 - 0.5 10*9/L Not in Time Range    Absolute Lymphocytes 3.4 (01/24/2023) 1.1 - 3.6 10*9/L Not in Time Range    Absolute Neutrophils 11.9 (H) (01/24/2023) 1.8 - 7.8 10*9/L Not in Time Range    Alkaline Phosphatase 93 (01/24/2023) 46 - 116 U/L Not in Time Range    ALT 49 (01/24/2023) 10 - 49 U/L Not in Time Range    AST 21 (01/24/2023) <=34 U/L Not in Time Range    BUN 18 (01/24/2023) 9 - 23 mg/dL Not in Time Range    Calcium 9.2 (01/24/2023) 8.7 - 10.4 mg/dL Not in Time Range    Creatinine 1.64 (H) (01/24/2023) 0.73 - 1.18 mg/dL Not in Time Range    CRP 7.0 (01/24/2023) <=10.0 mg/L Not in Time Range  HGB 15.3 (01/24/2023) 12.9 - 16.5 g/dL Not in Time Range    Platelet 185 (01/24/2023) 150 - 450 10*9/L Not in Time Range    Potassium 3.8 (01/24/2023) 3.4 - 4.8 mmol/L Not in Time Range    Total Bilirubin 0.3 (01/24/2023) 0.3 - 1.2 mg/dL Not in Time Range    WBC 18.3 (H) (01/24/2023) 3.6 - 11.2 10*9/L Not in Time Range

## 2023-02-07 ENCOUNTER — Telehealth
Admit: 2023-02-07 | Discharge: 2023-02-08 | Payer: MEDICAID | Attending: Infectious Disease | Primary: Infectious Disease

## 2023-02-07 DIAGNOSIS — R61 Generalized hyperhidrosis: Principal | ICD-10-CM

## 2023-02-07 DIAGNOSIS — A4902 Methicillin resistant Staphylococcus aureus infection, unspecified site: Principal | ICD-10-CM

## 2023-02-07 DIAGNOSIS — C9101 Acute lymphoblastic leukemia, in remission: Principal | ICD-10-CM

## 2023-02-07 MED ORDER — VALACYCLOVIR 500 MG TABLET
ORAL_TABLET | Freq: Every day | ORAL | 3 refills | 90 days | Status: CP
Start: 2023-02-07 — End: ?

## 2023-02-07 MED ORDER — DOXYCYCLINE HYCLATE 100 MG TABLET
ORAL_TABLET | Freq: Two times a day (BID) | ORAL | 3 refills | 90 days | Status: CP
Start: 2023-02-07 — End: ?

## 2023-02-07 MED ORDER — SULFAMETHOXAZOLE 800 MG-TRIMETHOPRIM 160 MG TABLET
ORAL_TABLET | Freq: Two times a day (BID) | ORAL | 3 refills | 24 days | Status: CP
Start: 2023-02-07 — End: ?

## 2023-02-11 MED ORDER — OXYCODONE 10 MG TABLET
ORAL_TABLET | ORAL | 0 refills | 20 days | Status: CP | PRN
Start: 2023-02-11 — End: ?

## 2023-02-14 NOTE — Unmapped (Signed)
The [facility] has made a third and final attempt to reach this patient to refill the following medication:Iclusig.      We have left voicemails on the following phone numbers: [phone], have sent a text message to the following phone numbers: [phone], and have sent a Mychart questionnaire..    Dates contacted: 10/22,30,11/12  Last scheduled delivery: 01/03/23    The patient may be at risk of non-compliance with this medication. The patient should call the [facility] at [phone]  Option 4, then Option 1: Oncology to refill medication.    [facility] Specialty Technician

## 2023-02-22 NOTE — Unmapped (Signed)
Upon investigation, one additional attempt for patient contact was warranted.  The [facility] attempted to reach out to patient on 02/22/23.  Calls placed and message left on both numbers listed in patient chart as well as text message.  [facility] will wait for patient to return our calls.  If not contact is made within an appropriate time frame, the patient will be disenrolled from the [facility] specialty call services.    Previous Dates contacted: 10/22,30,11/12  Last scheduled delivery: 01/03/23    Kermit Balo, [facility]  [facility] Specialty Pharmacist

## 2023-02-22 NOTE — Unmapped (Signed)
[facility] Refill Coordination Note    Specialty Medication(s) to be Shipped:   Hematology/Oncology: Iclusig    Other medication(s) to be shipped: No additional medications requested for fill at this time     Adam Keith, DOB: [birth year]  Phone: [phone] (work)      All above HIPAA information was verified with patient.     Was a Nurse, learning disability used for this call? No    Completed refill call assessment today to schedule patient's medication shipment from the [facility] [phone]).  All relevant notes have been reviewed.     Specialty medication(s) and dose(s) confirmed: Regimen is correct and unchanged.   Changes to medications: Adam Keith reports starting the following medications: Testosterone Gel  Changes to insurance: No  New side effects reported not previously addressed with a pharmacist or physician: Yes - Patient reports low testosterone. Patient would not like to speak to the pharmacist today. Their provider is aware.  Questions for the pharmacist: No    Confirmed patient received a Conservation officer, historic buildings and a Surveyor, mining with first shipment. The patient will receive a drug information handout for each medication shipped and additional FDA Medication Guides as required.       DISEASE/MEDICATION-SPECIFIC INFORMATION        N/A    SPECIALTY MEDICATION ADHERENCE     Medication Adherence    Patient reported X missed doses in the last month: 0  Specialty Medication: Iclusig 30 mg  Patient is on additional specialty medications: No  Informant: patient     Were doses missed due to medication being on hold? No    Iclusig 30 mg: 5 days of medicine on hand       REFERRAL TO PHARMACIST     Referral to the pharmacist: Not needed      [facility]     Shipping address confirmed in Epic.     Delivery Scheduled: Yes, Expected medication delivery date: 02/24/23.     Medication will be delivered via UPS to the prescription address in Epic [place].    [name] [facility] Specialty Technician

## 2023-02-23 DIAGNOSIS — C9101 Acute lymphoblastic leukemia, in remission: Principal | ICD-10-CM

## 2023-03-07 ENCOUNTER — Telehealth: Admit: 2023-03-07 | Discharge: 2023-03-08 | Payer: MEDICAID

## 2023-03-07 DIAGNOSIS — F4310 Post-traumatic stress disorder, unspecified: Principal | ICD-10-CM

## 2023-03-07 DIAGNOSIS — F3341 Major depressive disorder, recurrent, in partial remission: Principal | ICD-10-CM

## 2023-03-07 DIAGNOSIS — F419 Anxiety disorder, unspecified: Principal | ICD-10-CM

## 2023-03-07 NOTE — Unmapped (Signed)
[facility]  Psychiatry--Comprehensive Cancer Support Program   Established Patient E&M Service - Outpatient       Assessment:    Adam Keith presents for follow-up evaluation. He states that his depression and anxiety are both mild in severity and that he feels that his current medication regimen is working well for him. No medication changes needed today. Continuing his nortriptyline 100 mg at bedtime, mirtazapine 45 mg at bedtime, and methylphenidate 5 mg BID (if he chooses to take the methylphenidate). Will reassess on 06/13/23.     Identifying Information:  Adam Keith is a 42 y.o. male with a history of PH+ B cell ALL, recurrent major depressive disorder, PTSD, anxiety, and insomnia. He has had a long struggle with intermittent depressive episodes with one suicide attempt at age 62.      Risk Assessment:  An assessment of suicide and violence risk factors was performed as part of this evaluation and is not significantly increased from the last visit.   While future psychiatric events cannot be accurately predicted, the patient does not currently require acute inpatient psychiatric care and does not currently meet [facility] involuntary commitment criteria.      Plan:    Problem 1: Major Depressive Disorder  Status of problem: chronic with mild exacerbation  Interventions:   Continue nortriptyline 100 mg at bedtime.    Continue mirtazapine 45 mg at bedtime   Re-start methylphenidate 5 mg BID. He still plans to trial this medication at a later date.  Encouraged psychotherapy with local therapist     Problem 2: Anxiety/PTSD  Status of problem: chronic with mild exacerbation  Interventions:   See MDD above     Problem 3: Insomnia  Status of problem:  hypersomnia  Interventions:   See mirtazapine plan above      Psychotherapy provided:  No billable psychotherapy service provided.    Patient has been given this writer's contact information as well as the [facility] Psychiatry urgent line number. The patient has been instructed to call 911 for emergencies.      Subjective:    Chief complaint:  Follow-up psychiatric evaluation for depression and anxiety    Interval History:   Still not feeling well physically and very frustrated. States that the testosterone is not helping. Ongoing fatigue. He has not re-tried the methylphenidate for fatigue because of his ongoing physical concerns.     States that his depression and anxiety are mild in severity. Denies anhedonia. I physically feel so bad--in my head I want to do things, but my body won't cooperate.  Explained that he has had a lot of days of hopelessness, but states that he's not depressed or anxious about it. Very motivated to enjoy the holidays to the fullest. I don't feel like I'm going to be here for the next one--I have accepted it. Pain has been worsening. Patient questioning his life expectancy given he has been feeling bad for a very long time.     Ongoing issue with hypersomnia. Having a hard time getting up in the mornings. Often sleeping 12 to 14 hours at night.     Prior Psychiatric Medication Trials: bupropion (could not tolerate), duloxetine, mirtazapine (given during recent hospitalization), lorazepam, clonazepam      Psychiatric Review of Systems  Depressive Symptoms: depressed mood,(now mild in severity) and fatigue  Manic Symptoms: N/A  Psychosis Symptoms: N/A  Anxiety Symptoms: excessive anxiety at times, ruminations  Panic Symptoms: denies  PTSD Symptoms: denies  Sleep  disturbance: hypersomnia  Exercise: Unable to engage in a daily exercise routine.      Objective:      Mental Status Exam:  Appearance:    Appears stated age and Well nourished   Motor:   No abnormal movements   Speech/Language:    Normal rate, volume, tone, fluency   Mood:    OK   Affect:   Calm, Cooperative, and Euthymic   Thought process and Associations:   Logical, linear, clear, coherent, goal directed   Abnormal/psychotic thought content:     Denies SI, HI, self harm, delusions, obsessions, paranoid ideation, or ideas of reference   Perceptual disturbances:     Denies auditory and visual hallucinations, behavior not concerning for response to internal stimuli     Other:   Insight and judgment intact     I personally spent 43 minutes face-to-face and non-face-to-face in the care of this patient, which includes all pre, intra, and post visit time on the date of service.    Visit was completed by video (or phone) and the appropriate disclaimer has been included below.    The patient reports they are physically located in [place] and is currently: at home. I conducted a audio/video visit. I spent 28 minutes on the video call with the patient. I spent an additional 15 minutes on pre- and post-visit activities on the date of service .        Sheran Lawless, PMHNP  03/07/2023

## 2023-03-08 NOTE — Unmapped (Signed)
 Comprehensive Cancer Support Program (CCSP) - Psychiatry Outpatient Clinic   After Visit Summary    It was a pleasure to see you today in the [facility]???s Comprehensive Cancer Support Program (CCSP). The CCSP is a multidisciplinary program dedicated to helping patients, caregivers, and families with cancer treatment, recovery and survivorship.      To schedule, cancel, or change your appointment:  Please call the [facility] schedulers at [phone], Monday through Friday 8AM - 5PM.  Someone will return your call within 24 hours.      If you have a question about your medicines or you need to contact your provider:  First, try sending a My Chart message to [name], PMHNP. If you are unable to do so, please call the CCSP program coordinator, [facility], at [phone].     For after hours urgent issues, you may call [phone] or call the I need to talk line at [phone]-TALK ([phone]) anytime 24/7.    CCSP Patient and Family Resource Center: [phone].    CCSP Website:  [email or web address]    For prescription refills, please allow at least 24 hours (during business hours, M-F) for providers to call in refills to your pharmacy. We are generally unable to accommodate same-day requests for refills.     If you are taking any controlled substances (such as anxiety or sleep medications), you must use them as the directions say to use them. We generally do not provide early refills over the phone without clear reason, and it would be inappropriate to obtain the medications from other doctors. We routinely use the [place] controlled substance database to monitor prescription drug use.

## 2023-03-09 ENCOUNTER — Ambulatory Visit: Admit: 2023-03-09 | Discharge: 2023-03-10 | Payer: MEDICAID | Attending: Hematology | Primary: Hematology

## 2023-03-09 ENCOUNTER — Telehealth
Admit: 2023-03-09 | Discharge: 2023-03-10 | Payer: MEDICAID | Attending: Student in an Organized Health Care Education/Training Program | Primary: Student in an Organized Health Care Education/Training Program

## 2023-03-09 ENCOUNTER — Ambulatory Visit: Admit: 2023-03-09 | Discharge: 2023-03-10 | Payer: MEDICAID

## 2023-03-09 DIAGNOSIS — R5383 Other fatigue: Principal | ICD-10-CM

## 2023-03-09 DIAGNOSIS — E291 Testicular hypofunction: Principal | ICD-10-CM

## 2023-03-09 DIAGNOSIS — C9101 Acute lymphoblastic leukemia, in remission: Principal | ICD-10-CM

## 2023-03-09 DIAGNOSIS — Z515 Encounter for palliative care: Principal | ICD-10-CM

## 2023-03-09 DIAGNOSIS — F32A Depression, unspecified depression type: Principal | ICD-10-CM

## 2023-03-09 DIAGNOSIS — G893 Neoplasm related pain (acute) (chronic): Principal | ICD-10-CM

## 2023-03-09 MED ORDER — PREGABALIN 100 MG CAPSULE
ORAL_CAPSULE | Freq: Three times a day (TID) | ORAL | 3 refills | 30 days | Status: CP
Start: 2023-03-09 — End: 2024-03-08

## 2023-03-09 MED ORDER — OXYCODONE 10 MG TABLET
ORAL_TABLET | ORAL | 0 refills | 20 days | Status: CP | PRN
Start: 2023-03-09 — End: ?

## 2023-03-09 NOTE — Unmapped (Signed)
OUTPATIENT ONCOLOGY PALLIATIVE CARE     Principal Diagnosis: Mr. Boatner is a 42 y.o. 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 He was recently admitted to [facility] with lowe extremity pain and edema. Has had episodic fluctuations in mood.  Has decided not to pursue bone marrow transplant at this time. Has felt chronically ill for the last year.  Unclear unfortunately exactly what is contributing to this, like the combination of treatment side effects and refractory depression.  Unfortunately has started to feel a little bit better with testosterone supplementation. Appreciate oncology and psychiatry assistance in management.     Assessment/Plan:      # Depressed Mood: Chronic depression, continues to feel down due to constitutional fatigue, pain, inability to go outside and do things that bring meaning to him.  Continues to follow with [name].  Actually feeling a little bit better psychologically today.  -Medications per [name]        #Pain: Chronic, persistent pain, primarily in joints. Associated with/exacerbated by sitting too long, though he also says that activity seems to make it worst. Previously trialed butrans patch but did not find this effective. He feels that current oxycodone regimen helps keep him functional.  MRI of the spine showed narrowing in the lumbar region likely causing sciatica-like pain.    - Increase Lyrica to 1000 mg p.o. 3 times daily.    - oxycodone 10 mg q4 prn for breakthrough pain, using 4-5 times a day.  Refill sent today      #Persistent productive cough, ENT infections, low grade fevers  -Not addressed in detail today       #Chronic pain: At baseline, see above  - Pain management as above     #Advance care planning: Continues to prioritize quality of life.  After discussion with infectious disease as well as oncology has made the decision not to pursue another bone marrow transplant at this time.  Reviewed previously if he is considering not continuing cancer directed therapy.  He states that despite how poorly he has been feeling he is not at that stage yet.     #Controlled substances risk management:  Patient does not have a signed pain medication agreement with our team.  [place] database was reviewed today and it was appropriate.  Urine drug screen was not performed at this visit. Findings: not applicable.  Patient has received information about safe storage and administration of medications.  Patient has not received a prescription for narcan; is not applicable.      F/u: 6-8 weeks     ----------------------------------------  Referring Provider: Dr. Berline Lopes  Oncology Team: Malignant hematology team  PCP: [name], MD     HPI: 42 year old man with a diagnosis of B-ALL, diagnosed in October 2021, now in complete remission. Recently admitted to the [facility] ICU 03/06/21-03/20/21 for AHRF d/t MRSA/klebsiella PNA, requiring intubation. This is following admissions in August, May, April, March, February, and January 2022.     Interval History:   Has seen ID and CCSP since our last visit. Was started on testosterone a month ago and is hoping that is starting to work. Has had several good days and has been able to be more active and get into his shop. Urology has prescribed.     Is seeing oncology next week and still wants more of a workup on why he has days where he sleeps for 16+ hours. He hasn't been able to identify a  pattern.     Overall his mood is more hopeful. Pain has still been bad the last month. Worse at night. When he is active and out in the shop on his feet his pain is a little worse. Describes it as aches in his joints and then chronic back pain that gets worse if he doesn't change positions.     Is taking Lyrica 50mg  TID. Hasn't noticed much benefit. Does have appointment to see rheum in February.    Celebrated Thanksgiving on a Saturday so all the kids could visit.        Palliative Performance Scale: 80% - Ambulation: Full / Normal Activity, some evidence of disease / Self-Care:Full / Intake: Normal / Level of Conscious: Full     Coping/Support Issues: Having a lot of difficulty coping with his current setback. Well supported by his wife. Also has a psychiatrist and therapist.     Goals of Care: Treatment and cancer-directed therapy     Social History:  Name of primary support: [name], 4 daughters and 1 son. 25, 22, 3 20 year olds.  Occupation: Was in the [employer]	[place]  Hobbies: Woodworking  Current residence / distance from [facility]: [place]     Has a 44 year old son.      Advance Care Planning: Did not discuss this visit.        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    Onion Anaphylaxis    Vancomycin Analogues Other (See Comments)       Hearing loss with Lasix     Other reaction(s): Other (See Comments)     Hearing loss     lasix     Hearing loss      lasix      Hearing loss with Lasix Other reaction(s): Other (See Comments) Hearing loss lasix    Bismuth Subsalicylate Nausea And Vomiting    Privigen [Immun Glob G(Igg)-Pro-Iga 0-50] Other (See Comments)       03/10/2021 VIG infusion stopped as patient developed rigors, tachycardia and HTN during infusion despite pre-medication; 03/12/21 completed IVIG with premedications and slower rate of infusion    Furosemide Other (See Comments)       With Vancomycin caused hearing loss     Other reaction(s): Other (See Comments)     With Vancomycin caused hearing loss      With Vancomycin caused hearing loss Other reaction(s): Other (See Comments) With Vancomycin caused hearing loss    Gammagard           Family History:  Cancer-related family history includes Cancer in his maternal grandfather and mother. There is no history of Melanoma.  He indicated that the status of his mother is unknown. He indicated that the status of his father is unknown. He indicated that the status of his sister is unknown. He indicated that the status of his brother is unknown. He indicated that the status of his maternal grandmother is unknown. He indicated that the status of  his maternal grandfather is unknown. He indicated that the status of his paternal grandmother is unknown. He indicated that the status of his paternal grandfather is unknown. He indicated that the status of his maternal aunt is unknown. He indicated that the status of his maternal uncle is unknown. He indicated that the status of his paternal aunt is unknown. He indicated that the status of his paternal uncle is unknown. He indicated that the status of his neg hx is unknown.        REVIEW OF SYSTEMS:  A comprehensive review of 10 systems was negative except for pertinent positives noted in HPI.           Lab Results   Component Value Date     CREATININE 1.93 (H) 02/22/2022            Lab Results   Component Value Date     ALKPHOS 92 02/22/2022     BILITOT 0.4 02/22/2022     BILIDIR 0.20 02/07/2022     PROT 6.6 02/22/2022     ALBUMIN 4.1 02/22/2022     ALT 96 (H) 02/22/2022     AST 45 (H) 02/22/2022      Physical exam:  Gen: NAD  Pulm: nl WOB  Skin: Dry and red  Psych: Guarded and dysphoric      The patient reports they are physically located in [place] and is currently: at home. I conducted a audio/video visit. I spent  46m 10s on the video call with the patient. I spent an additional 15 minutes on pre- and post-visit activities on the date of service .       Kathlynn Grate, MD  [facility] Outpatient Oncology Palliative Care

## 2023-03-20 ENCOUNTER — Other Ambulatory Visit: Admit: 2023-03-20 | Discharge: 2023-03-21 | Payer: MEDICAID

## 2023-03-20 ENCOUNTER — Ambulatory Visit: Admit: 2023-03-20 | Discharge: 2023-03-21 | Payer: MEDICAID | Attending: Adult Health | Primary: Adult Health

## 2023-03-20 ENCOUNTER — Ambulatory Visit: Admit: 2023-03-20 | Discharge: 2023-03-21 | Payer: MEDICAID

## 2023-03-20 DIAGNOSIS — C9101 Acute lymphoblastic leukemia, in remission: Principal | ICD-10-CM

## 2023-03-20 DIAGNOSIS — R051 Acute cough: Principal | ICD-10-CM

## 2023-03-20 DIAGNOSIS — E291 Testicular hypofunction: Principal | ICD-10-CM

## 2023-03-20 LAB — CBC W/ AUTO DIFF
BASOPHILS ABSOLUTE COUNT: 0.1 10*9/L (ref 0.0–0.1)
BASOPHILS RELATIVE PERCENT: 1.1 %
EOSINOPHILS ABSOLUTE COUNT: 0.6 10*9/L — ABNORMAL HIGH (ref 0.0–0.5)
EOSINOPHILS RELATIVE PERCENT: 7.5 %
HEMATOCRIT: 47.3 % (ref 39.0–48.0)
HEMOGLOBIN: 16.6 g/dL — ABNORMAL HIGH (ref 12.9–16.5)
LYMPHOCYTES ABSOLUTE COUNT: 2.4 10*9/L (ref 1.1–3.6)
LYMPHOCYTES RELATIVE PERCENT: 27.9 %
MEAN CORPUSCULAR HEMOGLOBIN CONC: 35 g/dL (ref 32.0–36.0)
MEAN CORPUSCULAR HEMOGLOBIN: 30.3 pg (ref 25.9–32.4)
MEAN CORPUSCULAR VOLUME: 86.5 fL (ref 77.6–95.7)
MEAN PLATELET VOLUME: 10.1 fL (ref 6.8–10.7)
MONOCYTES ABSOLUTE COUNT: 0.5 10*9/L (ref 0.3–0.8)
MONOCYTES RELATIVE PERCENT: 5.8 %
NEUTROPHILS ABSOLUTE COUNT: 5 10*9/L (ref 1.8–7.8)
NEUTROPHILS RELATIVE PERCENT: 57.7 %
PLATELET COUNT: 195 10*9/L (ref 150–450)
RED BLOOD CELL COUNT: 5.47 10*12/L (ref 4.26–5.60)
RED CELL DISTRIBUTION WIDTH: 14.8 % (ref 12.2–15.2)
WBC ADJUSTED: 8.6 10*9/L (ref 3.6–11.2)

## 2023-03-20 LAB — LYMPH MARKER LIMITED,FLOW
ABSOLUTE CD3 CNT: 1848 {cells}/uL (ref 915–3400)
ABSOLUTE CD4 CNT: 1488 {cells}/uL (ref 510–2320)
ABSOLUTE CD8 CNT: 312 {cells}/uL (ref 180–1520)
CD3% (T CELLS): 77 % (ref 61–86)
CD4% (T HELPER): 62 % — ABNORMAL HIGH (ref 34–58)
CD4:CD8 RATIO: 4.8 (ref 0.9–4.8)
CD8% T SUPPRESR: 13 % (ref 12–38)

## 2023-03-20 LAB — COMPREHENSIVE METABOLIC PANEL
ALBUMIN: 3.9 g/dL (ref 3.4–5.0)
ALKALINE PHOSPHATASE: 103 U/L (ref 46–116)
ALT (SGPT): 36 U/L (ref 10–49)
ANION GAP: 10 mmol/L (ref 5–14)
AST (SGOT): 30 U/L (ref <=34)
BILIRUBIN TOTAL: 0.4 mg/dL (ref 0.3–1.2)
BLOOD UREA NITROGEN: 17 mg/dL (ref 9–23)
BUN / CREAT RATIO: 10
CALCIUM: 9.6 mg/dL (ref 8.7–10.4)
CHLORIDE: 109 mmol/L — ABNORMAL HIGH (ref 98–107)
CO2: 25 mmol/L (ref 20.0–31.0)
CREATININE: 1.73 mg/dL — ABNORMAL HIGH (ref 0.73–1.18)
EGFR CKD-EPI (2021) MALE: 50 mL/min/{1.73_m2} — ABNORMAL LOW (ref >=60)
GLUCOSE RANDOM: 104 mg/dL (ref 70–179)
POTASSIUM: 4 mmol/L (ref 3.5–5.1)
PROTEIN TOTAL: 6.1 g/dL (ref 5.7–8.2)
SODIUM: 144 mmol/L (ref 135–145)

## 2023-03-20 LAB — IGG: GAMMAGLOBULIN; IGG: 271 mg/dL — ABNORMAL LOW (ref 650–1600)

## 2023-03-20 LAB — SLIDE REVIEW

## 2023-03-20 LAB — TESTOSTERONE: TESTOSTERONE TOTAL: 158 ng/dL — ABNORMAL LOW (ref 197–670)

## 2023-03-20 NOTE — Unmapped (Signed)
Adam Keith- Were you able to start your testosterone?

## 2023-03-20 NOTE — Unmapped (Signed)
I will update you once the viral panel and chest xray are complete and I will discuss with Dr. [name].    BCR/ABL will be back later this week.    Continue the ponatinib and aspirin daily.    I will look into who manages sleep apnea and the Inspire implant.    We will see you in 3 months, sooner if needed.

## 2023-03-20 NOTE — Unmapped (Signed)
[facility]    Patient Name: Adam Keith  Patient Age: 42 y.o.  Encounter Date: 03/20/2023    Primary Care Provider:  Dierdre Harness, MD    Referring Physician:  Oneita Keith, None Per Patient  [address] [place],  [place] [address]    Cancer Diagnosis: Ph+ ALL; Initial Dx 01/2020  Cancer status: MRD-neg remission  Treatment Regimen: Third Line, ponatinib monotherapy   Treatment Goal: Curative  Comorbidities: COPD, OSA, DVT/PE  Transplant: Discussed, but did not want to proceed    Assessment:  A 42 y.o. 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 The course was complicated by septic shock, candida krusei fungemia, MRSA bacteremia with septic emboli c/b acute renal failure and respiratory distress requiring dialysis and intubation.  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  He subsequently had difficulty tolerating single agent dasatinib (as a bridge to planned ponatinib-blinatumomab--had fluid retention and pleural effusion).  He then completed 4 cycles of ponatinib-blinatumomab, initiated for treatment of MRD in the setting of poor tolerance of standard therapy.      He is currently on ponatinib 30 mg monotherapy (on monotherapy since 01/2021).  When we decreased his ponatinib to 15 mg, he had a rise in transcripts. Now, BCR/ABL is negative on 30 mg. He feels strongly that he would want to stay on the 30 mg for now.     Today he is feeling poorly, with elevated temps (not full fever) and worsening pulmonary symptoms. CXR and RVP are negative. Given his complex infectious history, I updated Dr. Reynold Keith - given the negative tests and long-term doxycycline, she doesn't recommend additional abx. Encouraged him to follow recommendations from pulmonology.    Unfortunately he continues to feel poorly overall and is quite depressed. He continues to smoke, does note use the inhalers and has not moved forward with treatment of sleep apnea. I encouraged him to consider making changes to potentially improve his symptoms and he reports interest in evaluation for [facility] for sleep apnea. I will place a referral.      Plan and Recommendations:  Ph+ B-ALL : MRD-neg by PCR, hx of CNS ds   - Continue ponatinib 30 mg  - Continue aspirin while on ponatinib (also on apixaban)   - BCR-ABL q 3 months on peripheral blood     Fatigue with BL symmetric oligoarthritis of shoulders, hips and knees and increased inflammatory markers: Multifactorial etiology for fatigue. Could be related to known depression, COPD, OSA unable to tolerate CPAP, Vit D deficiency, prior infections and male hypogonadism. Rule out polymyalgia rheumatica vs other inflammatory processes vs side effects from TKI vs chronic infectious process. Improved with 15 mg of prednisone.   - symptoms worsened with trial of prednisone Fall 2024    Rule out male hypogonadism: Testosterone level low on 2 occasions. FSH/LH normal.  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   - No plan for further IVIG given several reactions   - Rec Flu and Covid vaccines     DRESS: followed by Dr. 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 Now s/p dialysis  - stable Cr appears to have developed CKD due to incompletely healed acute injury.  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 CT chest 02/06/22 with residual subacute thrombus.   - Indefinite anticoagulation with apixaban (in addition to asa given ponatinib)     Vitamin D Deficiency: was low 07/2022.   - started supplementation 07/2022.   - He has not noticed any change in symptoms with it. Will recheck level in 1 month.     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 Reynold Keith and Dr. Verlan Keith and Dr. Maple Keith following patient.    - long term doxycycline with goal of decolonization  **Hx Candidemia during induction - now s/p Cresemba  - followed by Dr. Reynold Keith  **Hx Covid-19 x 4 documented   - possible long-Covid symptoms    Symptom management: bowel regimen as listed in medication list, analgesics as listed in medication list or symptoms reviewed and due to acceptable control, no changes made  **Peripheral neuropathy, sensory and motor  - Grade 1-2 monitor  **Chronic Back Pain, complicated by hospital stay and procedures  - Followed by Dr. Genice Keith, Palliative Care  **Depression/anxiety: following with Adam Keith, [facility]  - management per psychiatry     Venous access: I recommend the following change for venous access : PICC line removal      Supportive Care Recommendations:  We recommend based on the patient???s underlying diagnosis and treatment history the following supportive care:    1. Antimicrobial prophylaxis:   - Primary management per ICID  - On daily doxy for recurrent MRSA pna    - Viral - valacyclovir 500mg  daily  - Bacterial - levofloxacin 500 mg if ANC <0.5  - Fungal - previously on treatment isavuconazole - off now  - PJP - On Bactrim DS BID Sat/Sun  - recommend Annual flu shot - declines thus far  - recommend updated Covid vaccine - declines    2. Blood product support:  I recommend the following intervals for laboratory monitoring to determine transfusion needs and monitor for hematologic effects of therapy and underlying disease: no routine monitoring outside of clinic follow-up    Leukoreduced blood products are required.  Irradiated blood products are preferred, but in case of urgent transfusion needs non-irradiated blood products may be used:     -  RBC transfusion threshold: transfuse 2 units for Hgb < 8 g/dL.  -  Platelet transfusion threshold: transfuse 1 unit of platelets for platelet count < 10, or for bleeding or need for invasive procedure.    3. Hematopoietic growth factor support: none    Care coordination: The next [facility] Follow up requested:      Mariel Aloe, MD  Leukemia Program  Division of Hematology  [facility]     Nurse Navigator (non-clinical trial patients): Elicia Lamp, RN  Tel. [phone]       Fax. [phone]  Toll-free appointments: [phone]  Scheduling assistance: [phone]  After hours/weekends: [phone] (ask for adult hematology/oncology on-call)      History of Present Illness:  We had the pleasure of seeing Timoty Spaur in the Leukemia Clinic at the [facility] [place] on 03/20/2023.  He is a 42 y.o. male with  Ph+ B-ALL .        Interim History:    He has felt worse in the past week or so - elevated temps (to 100.3), more SOB, more wheezing, more tired. He is not using his albuterol inhaler. Smokes about 2 packs/day.  Prior to feeling worse, he reports feeling up/down.  Some good days, some bad days.  Trying to be more active. But then needs a lot of time for recovery.  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  Nighttime he feels worse.  Dr. Genice Keith recently Increased the Lyrica  - taking morning and night.  Feel sick inside - from the chemo. Feel a darkness like he is deteriorating. However he doesn't want to decrease the dose out of concern the leukemia progresses.  He wants to give himself a year to feel better.      His oncologic history is as follows:    Hematology/Oncology History Overview Note   Referring/Local Oncologist: None    Diagnosis:Ph+ ALL    Genetics:    Karyotype/FISH:Abnormal Karyotype: 46,XY,t(9;22)(q34;q11.2)[1]/45,XY,der(7;9)(q10;q10)t(9;22)(q34;q11.2),der(22)t(9;22)[11]/46,sdl,+der(22)t(9;22)[5]/46,XY[3]     Abnormal FISH: A BCR/ABL1 interphase FISH assay shows an abnormal signal pattern in 97% of the 100 cells scored. 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  The findings support a diagnosis of leukemia and have implications for targeted therapy and for monitoring residual disease.      Molecular Genetics:  BCR-ABL1 p210 transcripts were detected at a level of 46.479 IS% ratio in bone marrow.  BCR-ABL1 p190 transcripts were detected at a level of 4 in 100,000 cells in bone marrow.    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)     01/24/2020 - 04/02/2020 Chemotherapy    IP/OP LEUKEMIA GRAAPH-2005 + RITUXIMAB < 60 YO  rituximab hypercvad (odd and even course)     02/24/2020 Remission    CR with PCR MRD+; marrow showing 70% blasts with <1% blasts, negative flow MRD, BCR-ABL p210 PCR 0.197%; CNS negative for blasts     03/09/2020 Progression    CSF - rare blast ID'ed - IT chemo given    03/13/20 - IT chemo, CSF negative     04/16/2020 Biopsy    BM Bx with 40-50% cellularity, <1% blasts, MRD flow cytometry negative, BCR-ABL p210 transcripts 0.036%     05/28/2020 - 05/28/2020 Chemotherapy    OP AML - CNS THERAPY (INTRATHECAL CYTARABINE, INTRATHECAL METHOTREXATE, OR INTRATHECAL TRIPLE)  Select one of the following: cytarabine IT 100 mg with hydrocortisone 50 mg, cytarabine IT 40 mg with methotrexate 15 mg with hydrocortisone 50 mg, OR methotrexate IT 12 mg with hydrocortisone 50 mg     06/19/2020 -  Chemotherapy    IP/OP LEUKEMIA BLINATUMOMAB 7-DAY INFUSION (MINIMAL RESIDUAL DISEASE; WT >= 22 KG) (HOME  INFUSION)      Cycles 1*-4: Blinatumomab 28 mcg/day Days 1-28 of 6-week cycle.  *Given in the inpatient setting on Days 1-3 on Cycle 1 and Days 1-2 on Cycle 2, while other treatment days are given in the outpatient setting.    Cycle 1 MRD Dosing     07/18/2020 Adverse Reaction    Hospitalization: fevers. Pneumonia     07/23/2020 Biopsy    Diagnosis  Bone marrow, right iliac, aspiration and biopsy  -   Normocellular bone marrow (50%) with trilineage hematopoiesis and 1% blasts by manual aspirate differential  -   Flow cytometry MRD analysis reveals no definitive immunophenotypic evidence of residual B lymphoblastic leukemia   - BCR-ABL p210 0.006%         08/10/2020 -  Chemotherapy    Cycle 2 blinatumomab-ponatinib  Ponatinib 30mg     1 IT per cycle     09/21/2020 Adverse Reaction    Covid-19, symptomatic but not requiring hospitalization.     10/06/2020 -  Chemotherapy    Cycle 3 blinatumomab-ponatinib  Ponatinib 30mg        11/01/2020 Adverse Reaction    Hospitalization: MRSA Pneumonia requiring intubation    Blinatumomab stopped.  Ponatinib held until he stabilized.       12/14/2020 -  Chemotherapy    Cycle 4 blinatumomab 28 mcg/day days 1-28, ponatinib 30 mg per day IT triple therapy day 29     01/13/2021 -  Chemotherapy    Ponatinib 30mg  daily  - due to due low level BCR-ABL     03/06/2021 Adverse Reaction    Hospitalization - ICU on ventilator - influenza and MRSA/klebsiella PNA     06/13/2022 -  Chemotherapy    OP LEUKEMIA VINCRISTINE  vinCRIStine 2 mg IV on day 1     ALL (acute lymphoblastic leukemia) (CMS-HCC)   06/11/2020 -  Chemotherapy    IP/OP LEUKEMIA BLINATUMOMAB 7-DAY INFUSION (MINIMAL RESIDUAL DISEASE; WT >= 22 KG) (HOME INFUSION)  Cycles 1*-4: Blinatumomab 28 mcg/day Days 1-28 of 6-week cycle.  *Given in the inpatient setting on Days 1-3 on Cycle 1 and Days 1-2 on Cycle 2, while other treatment days are given in the outpatient setting.     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  *Given in the inpatient setting on Days 1-3 on Cycle 1 and Days 1-2 on Cycle 2, while other treatment days are given in the outpatient setting.     11/09/2020 Initial Diagnosis    Acute lymphoblastic leukemia in remission (CMS-HCC)     06/13/2022 -  Chemotherapy    OP LEUKEMIA VINCRISTINE  vinCRIStine 2 mg IV on day 1           Past Medical, Surgical and Family History were reviewed and pertinent updates were made in the Electronic Medical Record      ECOG Performance Status: 1    Medications:      Current Outpatient Medications   Medication Sig Dispense Refill    albuterol HFA 90 mcg/actuation inhaler Inhale 2 puffs every six (6) hours as needed for wheezing. 18  g 0    amLODIPine (NORVASC) 10 MG tablet Take 1 tablet (10 mg total) by mouth daily. 30 tablet 0    apixaban (ELIQUIS) 5 mg Tab Take 1 tablet (5 mg total) by mouth two (2) times a day. 60 tablet 6    arm brace (WRIST BRACE) Misc 1 Piece by Miscellaneous route as needed. left ulnar wrist brace 1 each 0    carvediloL (COREG) 12.5 MG tablet Take 1 tablet (12.5 mg total) by mouth Two (2) times a day. 180 tablet 1    cholecalciferol, vitamin D3-250 mcg, 10,000 unit,, 250 mcg (10,000 unit) capsule Take 1 capsule (250 mcg total) by mouth daily. 120 capsule 3    docusate sodium (COLACE) 100 MG capsule Take 1 capsule (100 mg total) by mouth daily.      doxycycline (VIBRA-TABS) 100 MG tablet Take 1 tablet (100 mg total) by mouth two (2) times a day. 180 tablet 3    hydroCHLOROthiazide 12.5 MG tablet Take 1 tablet (12.5 mg total) by mouth daily. 30 tablet 11    mirtazapine (REMERON) 45 MG tablet TAKE 1 TABLET BY MOUTH NIGHTLY 90 tablet 4    naloxone (NARCAN) 4 mg nasal spray One spray in either nostril once for known/suspected opioid overdose. May repeat every 2-3 minutes in alternating nostril til EMS arrives 2 each 0    nortriptyline (PAMELOR) 50 MG capsule TAKE 2 CAPSULES BY MOUTH EVERY DAY AT NIGHT 180 capsule 4    omeprazole (PRILOSEC) 40 MG capsule Take 1 capsule (40 mg total) by mouth every morning.      ondansetron (ZOFRAN-ODT) 4 MG disintegrating tablet Take 1 tablet (4 mg total) by mouth every eight (8) hours as needed for nausea.      oxyCODONE (ROXICODONE) 10 mg immediate release tablet Take 1 tablet (10 mg total) by mouth every four (4) hours as needed for pain. 120 tablet 0    PONATinib (ICLUSIG) 30 mg tablet Take 1 tablet (30 mg total) by mouth daily. Swallow tablets whole. Do not crush, break, cut or chew tablets. 30 tablet 5    pregabalin (LYRICA) 100 MG capsule Take 1 capsule (100 mg total) by mouth Three (3) times a day. 90 capsule 3    spironolactone (ALDACTONE) 25 MG tablet TAKE 1 TABLET (25 MG TOTAL) BY MOUTH DAILY. 90 tablet 3    sulfamethoxazole-trimethoprim (BACTRIM DS) 800-160 mg per tablet Take 1 tablet (160 mg of trimethoprim total) by mouth two (2) times a day. TAKE 1 TABLET BY MOUTH 2 TIMES A DAY ON SATURDAY, SUNDAY. FOR PROPHYLAXIS 48 tablet 3    testosterone (ANDROGEL) 20.25 mg/1.25 gram (1.62 %) gel pump Place 2 sprays (40.5 mg total) on the skin daily. 75 g 5    tiotropium-olodaterol (STIOLTO RESPIMAT) 2.5-2.5 mcg/actuation Mist Inhale 2 puffs daily. 4 g 3    tiotropium-olodaterol (STIOLTO RESPIMAT) 2.5-2.5 mcg/actuation Mist Inhale 2 puffs daily. 4 g 0    tiotropium-olodaterol (STIOLTO RESPIMAT) 2.5-2.5 mcg/actuation Mist Inhale 2 puffs daily. 4 g 0    valACYclovir (VALTREX) 500 MG tablet Take 1 tablet (500 mg total) by mouth daily. 90 tablet 3     No current facility-administered medications for this visit.       Vital Signs:  Vitals:    03/20/23 1304   BP: 147/98   Pulse: 89   Resp: 18   Temp: 36.1 ??C (97 ??F)   SpO2: 95%       GENERAL: Well-appearing white man. NAD. Accompanied by his  wife  HEENT: Pupils equal, round. EOMI.   HEART: Normal color, not excessive pallor. Not ashen.   CHEST/LUNG: Normal work of breathing. No dyspnea with conversation. Tight sounding lungs with scattered wheeze  EXTREMITIES: No edema, cyanosis or clubbing in viewed UE.   SKIN: No noticable rash or petechiae.   NEURO EXAM: Grossly intact.     MDM:  1 chronic life threatening illness  Drug therapy requiring intense monitoring for toxicity

## 2023-03-21 DIAGNOSIS — C9101 Acute lymphoblastic leukemia, in remission: Principal | ICD-10-CM

## 2023-03-22 DIAGNOSIS — G4733 Obstructive sleep apnea (adult) (pediatric): Principal | ICD-10-CM

## 2023-03-22 DIAGNOSIS — C9101 Acute lymphoblastic leukemia, in remission: Principal | ICD-10-CM

## 2023-03-22 NOTE — Unmapped (Signed)
Called the patient and discussed the [facility] criteria per patient's insurance ([place]). He reports his last sleep study was about 10 years ago and I explained per his insurance, he would need a new sleep study, which I recommended asking his PCP for. He said he would request a referral for a new sleep study and send the results to our office via fax. Provided our clinic fax number via MyChart. Also discussed that per his chart, his BMI is 37.55, which is over the limit of 35 per his insurance. Patient verbalized understanding. All questions answered.

## 2023-03-23 DIAGNOSIS — E291 Testicular hypofunction: Principal | ICD-10-CM

## 2023-03-23 NOTE — Unmapped (Signed)
Needs testosterone injection teaching with nursing. Per [employer]

## 2023-03-27 DIAGNOSIS — C9101 Acute lymphoblastic leukemia, in remission: Principal | ICD-10-CM

## 2023-04-04 ENCOUNTER — Institutional Professional Consult (permissible substitution): Admit: 2023-04-04 | Discharge: 2023-04-05 | Payer: MEDICAID

## 2023-04-04 DIAGNOSIS — R7989 Other specified abnormal findings of blood chemistry: Principal | ICD-10-CM

## 2023-04-04 NOTE — Unmapped (Signed)
The patient was identified with two identifiers. Patient in clinic today to learn how to give himself IM injection. The patient was shown how to safely and sterilely give himself IM injection. The patient did demonstrate back to the nurse in the correct manner. The patient did verbalized how to correctly give himself IM injection while demonstrating. 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 The patient verbalized understanding of teaching.

## 2023-04-04 NOTE — Unmapped (Signed)
The patient was in the clinic today for IM injection teaching for his testosterone  The patient demonstrated   back how to give himself the injections without difficulty   The patient needs his medication called into the pharmacy so he can get started   Thank you  Belenda Cruise

## 2023-04-06 DIAGNOSIS — C9101 Acute lymphoblastic leukemia, in remission: Principal | ICD-10-CM

## 2023-04-06 MED ORDER — TESTOSTERONE CYPIONATE 200 MG/ML INTRAMUSCULAR OIL
INTRAMUSCULAR | 5 refills | 56.00 days | Status: CP
Start: 2023-04-06 — End: ?

## 2023-04-06 MED ORDER — BD ECLIPSE LUER-LOK 3 ML 22 GAUGE X 1 1/2" SYRINGE
0 refills | 0.00 days | Status: CP
Start: 2023-04-06 — End: ?

## 2023-04-06 NOTE — Unmapped (Signed)
[facility] Clinical Assessment & Refill Coordination Note    Adam Keith, DOB: 1980-08-08  Phone: [phone] (work)    All above HIPAA information was verified with patient.     Was a Nurse, learning disability used for this call? No    Specialty Medication(s):   Hematology/Oncology: Iclusig     Current Outpatient Medications   Medication Sig Dispense Refill    albuterol HFA 90 mcg/actuation inhaler Inhale 2 puffs every six (6) hours as needed for wheezing. 18 g 0    amLODIPine (NORVASC) 10 MG tablet Take 1 tablet (10 mg total) by mouth daily. 30 tablet 0    apixaban (ELIQUIS) 5 mg Tab Take 1 tablet (5 mg total) by mouth two (2) times a day. 60 tablet 6    arm brace (WRIST BRACE) Misc 1 Piece by Miscellaneous route as needed. left ulnar wrist brace 1 each 0    carvediloL (COREG) 12.5 MG tablet Take 1 tablet (12.5 mg total) by mouth Two (2) times a day. 180 tablet 1    cholecalciferol, vitamin D3-250 mcg, 10,000 unit,, 250 mcg (10,000 unit) capsule Take 1 capsule (250 mcg total) by mouth daily. 120 capsule 3    docusate sodium (COLACE) 100 MG capsule Take 1 capsule (100 mg total) by mouth daily.      doxycycline (VIBRA-TABS) 100 MG tablet Take 1 tablet (100 mg total) by mouth two (2) times a day. 180 tablet 3    hydroCHLOROthiazide 12.5 MG tablet Take 1 tablet (12.5 mg total) by mouth daily. 30 tablet 11    mirtazapine (REMERON) 45 MG tablet TAKE 1 TABLET BY MOUTH NIGHTLY 90 tablet 4    naloxone (NARCAN) 4 mg nasal spray One spray in either nostril once for known/suspected opioid overdose. May repeat every 2-3 minutes in alternating nostril til EMS arrives 2 each 0    nortriptyline (PAMELOR) 50 MG capsule TAKE 2 CAPSULES BY MOUTH EVERY DAY AT NIGHT 180 capsule 4    omeprazole (PRILOSEC) 40 MG capsule Take 1 capsule (40 mg total) by mouth every morning.      ondansetron (ZOFRAN-ODT) 4 MG disintegrating tablet Take 1 tablet (4 mg total) by mouth every eight (8) hours as needed for nausea. oxyCODONE (ROXICODONE) 10 mg immediate release tablet Take 1 tablet (10 mg total) by mouth every four (4) hours as needed for pain. 120 tablet 0    PONATinib (ICLUSIG) 30 mg tablet Take 1 tablet (30 mg total) by mouth daily. Swallow tablets whole. Do not crush, break, cut or chew tablets. 30 tablet 5    pregabalin (LYRICA) 100 MG capsule Take 1 capsule (100 mg total) by mouth Three (3) times a day. 90 capsule 3    spironolactone (ALDACTONE) 25 MG tablet TAKE 1 TABLET (25 MG TOTAL) BY MOUTH DAILY. 90 tablet 3    sulfamethoxazole-trimethoprim (BACTRIM DS) 800-160 mg per tablet Take 1 tablet (160 mg of trimethoprim total) by mouth two (2) times a day. TAKE 1 TABLET BY MOUTH 2 TIMES A DAY ON SATURDAY, SUNDAY. FOR PROPHYLAXIS 48 tablet 3    syringe with needle, safety (BD ECLIPSE LUER-LOK) 3 mL 22 gauge x 1 1/2 Syrg To be used with TC injections 50 each 0    testosterone cypionate (DEPOTESTOTERONE CYPIONATE) 200 mg/mL injection Inject 0.5 mL (100 mg total) into the muscle once a week. Single Use Vials 4 mL 5    tiotropium-olodaterol (STIOLTO RESPIMAT) 2.5-2.5 mcg/actuation Mist Inhale 2 puffs daily. 4 g 3  tiotropium-olodaterol (STIOLTO RESPIMAT) 2.5-2.5 mcg/actuation Mist Inhale 2 puffs daily. 4 g 0    tiotropium-olodaterol (STIOLTO RESPIMAT) 2.5-2.5 mcg/actuation Mist Inhale 2 puffs daily. 4 g 0    valACYclovir (VALTREX) 500 MG tablet Take 1 tablet (500 mg total) by mouth daily. 90 tablet 3     Current Facility-Administered Medications   Medication Dose Route Frequency Provider Last Rate Last Admin    testosterone cypionate (DEPOTESTOTERONE CYPIONATE) injection 100 mg  100 mg Intramuscular Once Dionicio Stall, AGNP            Changes to medications: Adam Keith reports no changes at this time.    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    Onion Anaphylaxis    Vancomycin Analogues Other (See Comments)     Hearing loss with Lasix    Other reaction(s): Other (See Comments)    Hearing loss    lasix    Hearing loss      lasix      Hearing loss with Lasix Other reaction(s): Other (See Comments) Hearing loss lasix    Bismuth Subsalicylate Nausea And Vomiting    Privigen [Immun Glob G(Igg)-Pro-Iga 0-50] Other (See Comments)     03/10/2021 VIG infusion stopped as patient developed rigors, tachycardia and HTN during infusion despite pre-medication; 03/12/21 completed IVIG with premedications and slower rate of infusion    Furosemide Other (See Comments)     With Vancomycin caused hearing loss    Other reaction(s): Other (See Comments)    With Vancomycin caused hearing loss      With Vancomycin caused hearing loss Other reaction(s): Other (See Comments) With Vancomycin caused hearing loss    Gammagard        Changes to allergies: No    SPECIALTY MEDICATION ADHERENCE     Iclusig 30 mg: 0 days of medicine on hand     Are there any concerns with adherence?  No, patient denies missed doses.  [facility] last dispensed 30 day supply on 02/23/23    Adherence counseling provided?  Advised patient to call if he was down to less than 7 day supply going forward to avoid missed doses    Patient-Reported Symptoms Tracker for Cancer Patients on Oral Chemotherapy     Oral chemotherapy medication name(s): Iclusig  Dose and frequency: 30 mg once daily  Oral Chemotherapy Start Date:    Baseline? No  Clinic(s) visited: Hematology    Symptom Grouping Question Patient Response   Digestion and Eating Have you felt sick to your stomach? Denies    Had diarrhea? Denies    Constipated? Denies    Not wanting to eat? Denies    Comments      Sleep and Pain Felt very tired even after you rest? Denies    Pain due to cancer medication or cancer? Denies    Comments Other Side Effects Numbness or tingling in hands and/or feet? Denies    Felt short of breath? Denies    Mouth or throat Sores? Denies    Rash? Denies    Palmar-plantar erythrodysesthesia syndrome?      Rash - acneiform?  Rash - maculo-papular?      How many days over the past month did your cancer medication or cancer keep you from your normal activities?  Write in number of days, 0-30:  0    Other side effects or things you would like to discuss?      Comments?     Adherence  In the last 30 days, on how many days did you miss at least one dose of any of your [drug name]? Write in number of days, 0-30:  0    What reasons are you having trouble taking your medication [pharmacist: check all that apply]? Specify chemotherapy cycle:        No problems identified    Comments:   Patient denies  missed doses, [facility] last dispensed Iclusig on 02/23/23 for #30 day supply.    Comments       Optional Symptom Tracking Comments: Patient ran out and took last dose on 04/05/23    CLINICAL MANAGEMENT AND INTERVENTION      Clinical Benefit Assessment:    Do you feel the medicine is effective or helping your condition? Yes    Clinical Benefit counseling provided? Not needed    Acute Infection Status:    Acute infections noted within Epic:  No active infections    Patient reported infection: None    Therapy Appropriateness:    Is therapy appropriate based on current medication list, adverse reactions, adherence, clinical benefit and progress toward achieving therapeutic goals?  Yes, therapy is appropriate and should be continued    DISEASE/MEDICATION-SPECIFIC INFORMATION      N/A    Is the patient receiving adequate infection prevention treatment? Not applicable    Does the patient have adequate nutritional support? Not applicable    PATIENT SPECIFIC NEEDS     Does the patient have any physical, cognitive, or cultural barriers? No    Is the patient high risk? No    Did the patient require a clinical intervention? No    Does the patient require physician intervention or other additional services (i.e., nutrition, smoking cessation, social work)? No    SOCIAL DETERMINANTS OF HEALTH     At the [facility], we have learned that life circumstances - like trouble affording food, housing, utilities, or transportation can affect the health of many of our patients.   That is why we wanted to ask: are you currently experiencing any life circumstances that are negatively impacting your health and/or quality of life? Patient declined to answer    Social Drivers of Health     Food Insecurity: No Food Insecurity (07/04/2022)    Hunger Vital Sign     Worried About Running Out of Food in the Last Year: Never true     Ran Out of Food in the Last Year: Never true   Internet Connectivity: Not on file   Housing/Utilities: Low Risk  (07/04/2022)    Housing/Utilities     Within the past 12 months, have you ever stayed: outside, in a car, in a tent, in an overnight shelter, or temporarily in someone else's home (i.e. couch-surfing)?: No     Are you worried about losing your housing?: No     Within the past 12 months, have you been unable to get utilities (heat, electricity) when it was really needed?: No   Tobacco Use: High Risk (03/07/2023)    Patient History     Smoking Tobacco Use: Every Day     Smokeless Tobacco  Use: Former     Passive Exposure: Current   Transportation Needs: No Transportation Needs (07/04/2022)    PRAPARE - Therapist, art (Medical): No     Lack of Transportation (Non-Medical): No   Alcohol Use: Not on file   Interpersonal Safety: Not on file   Physical Activity: Not on file   Intimate Partner Violence: Unknown (07/07/2021)    Received from [facility], [facility]    HITS     Physically Hurt: Not on file     Insult or Talk Down To: Not on file     Threaten Physical Harm: Not on file     Scream or Curse: Not on file   Stress: Not on file   Substance Use: Not on file (02/06/2023)   Social Connections: Unknown (08/09/2021)    Received from [facility], [facility]    Social Network     Social Network: Not on file   Financial Resource Strain: Low Risk  (07/04/2022)    Overall Financial Resource Strain (CARDIA)     Difficulty of Paying Living Expenses: Not hard at all   Depression: Not at risk (11/03/2021)    Received from [facility]    PHQ-2   Health Literacy: Not on file       Would you be willing to receive help with any of the needs that you have identified today? Not applicable       SHIPPING     Specialty Medication(s) to be Shipped:   Hematology/Oncology: Iclusig    Other medication(s) to be shipped: No additional medications requested for fill at this time     Changes to insurance: No    Delivery Scheduled: Yes, Expected medication delivery date: 04/10/23.     Medication will be delivered via UPS to the confirmed prescription address in [facility].    The patient will receive a drug information handout for each medication shipped and additional FDA Medication Guides as required.  Verified that patient has previously received a Conservation officer, historic buildings and a Surveyor, mining.    The patient or caregiver noted above participated in the development of this care plan and knows that they can request review of or adjustments to the care plan at any time.      All of the patient's questions and concerns have been addressed.    Kermit Balo, [facility]   [facility]  Pharmacist

## 2023-04-06 NOTE — Unmapped (Signed)
Copied from CRM #[id number]. Topic: Access To Clinicians - Req Clinic Call Back  >> Apr 06, 2023  3:06 PM Adam Keith H wrote:  Pt is calling to have the prescription form  today sent to the pharmacy below instead of the the Home delivery       Please call pt and confirm changes have been made [phone] [facility])      [facility] [address] [place] - [address]  [address] [place], [place] [address]  Phone: [phone]  Fax: [phone]     Routine callback turnaround time: 24-48 business hours. Programmer, systems Notified)

## 2023-04-07 DIAGNOSIS — C9101 Acute lymphoblastic leukemia, in remission: Principal | ICD-10-CM

## 2023-04-07 MED ORDER — BD ECLIPSE LUER-LOK 3 ML 22 GAUGE X 1 1/2" SYRINGE
0 refills | 0.00 days | Status: CP
Start: 2023-04-07 — End: ?

## 2023-04-07 MED ORDER — TESTOSTERONE CYPIONATE 200 MG/ML INTRAMUSCULAR OIL
INTRAMUSCULAR | 5 refills | 56.00 days | Status: CP
Start: 2023-04-07 — End: ?

## 2023-04-07 MED ORDER — OXYCODONE 10 MG TABLET
ORAL_TABLET | ORAL | 0 refills | 20.00 days | Status: CP | PRN
Start: 2023-04-07 — End: ?

## 2023-04-08 NOTE — Unmapped (Signed)
Adam Keith 's ICLUSIG 30 mg tablet (PONATinib) shipment will be delayed as a result of test claim stuck.     I have reached out to the patient  at [phone]  and communicated the delay. We will call the patient back to reschedule the delivery upon resolution. We have not confirmed the new delivery date.

## 2023-04-10 DIAGNOSIS — C9101 Acute lymphoblastic leukemia, in remission: Principal | ICD-10-CM

## 2023-04-10 MED FILL — ICLUSIG 30 MG TABLET: ORAL | 30 days supply | Qty: 30 | Fill #1

## 2023-04-10 NOTE — Unmapped (Signed)
[name] 's ICLUSIG 30 mg tablet (PONATinib) shipment will be sent out as a result of claim was able to be reversed through [facility] Medicaid.      I have spoken with the patient  at [phone]  and communicated the delivery change. We will reschedule the medication for the delivery date that the patient agreed upon.  We have confirmed the delivery date as 04/11/23 via UPS

## 2023-04-13 DIAGNOSIS — C9101 Acute lymphoblastic leukemia, in remission: Principal | ICD-10-CM

## 2023-04-28 MED ORDER — OXYCODONE 10 MG TABLET
ORAL_TABLET | ORAL | 0 refills | 20.00 days | Status: CP | PRN
Start: 2023-04-28 — End: ?

## 2023-04-28 NOTE — Unmapped (Signed)
PDMP and chart reviewed; refill request for oxycodone appropriate. [facility].

## 2023-05-02 DIAGNOSIS — C9101 Acute lymphoblastic leukemia, in remission: Principal | ICD-10-CM

## 2023-05-02 NOTE — Unmapped (Signed)
[facility] Refill Coordination Note    Adam Keith, DOB: 1980/07/25  Phone: [phone] (work)      All above HIPAA information was verified with patient.         05/01/2023     7:38 PM   Specialty Rx Medication Refill Questionnaire   Which Medications would you like refilled and shipped? Ponatnib. 7 days left   Please list all current allergies: see my chart allergies   Have you missed any doses in the last 30 days? No   Have you had any changes to your medication(s) since your last refill? No   How many days remaining of each medication do you have at home? 7 days   Have you experienced any side effects in the last 30 days? No   Please enter the full address (street address, city, state, zip code) where you would like your medication(s) to be delivered to. [address] [place] [address], [place], [place] [address]   Please specify on which day you would like your medication(s) to arrive. Note: if you need your medication(s) within 3 days, please call the pharmacy to schedule your order at [phone]  05/08/2023   Has your insurance changed since your last refill? No   Would you like a pharmacist to call you to discuss your medication(s)? No   Do you require a signature for your package? (Note: if we are billing Medicare Part B or your order contains a controlled substance, we will require a signature) No         Completed refill call assessment today to schedule patient's medication shipment from the [facility] [phone]).  All relevant notes have been reviewed.       Confirmed patient received a Conservation officer, historic buildings and a Surveyor, mining with first shipment. The patient will receive a drug information handout for each medication shipped and additional FDA Medication Guides as required.         REFERRAL TO PHARMACIST     Referral to the pharmacist: Not needed      [facility]     Shipping address confirmed in Epic.     Delivery Scheduled: Yes, Expected medication delivery date: 05/09/23. Pt approved delivery for 05/09/23    Medication will be delivered via UPS to the prescription address in Epic [facility].    Adam Keith   [facility] Specialty Technician

## 2023-05-08 MED FILL — ICLUSIG 30 MG TABLET: ORAL | 30 days supply | Qty: 30 | Fill #2

## 2023-05-11 ENCOUNTER — Encounter
Admit: 2023-05-11 | Discharge: 2023-05-12 | Payer: MEDICAID | Attending: Student in an Organized Health Care Education/Training Program | Primary: Student in an Organized Health Care Education/Training Program

## 2023-05-11 DIAGNOSIS — G893 Neoplasm related pain (acute) (chronic): Principal | ICD-10-CM

## 2023-05-11 DIAGNOSIS — C9101 Acute lymphoblastic leukemia, in remission: Principal | ICD-10-CM

## 2023-05-11 DIAGNOSIS — Z515 Encounter for palliative care: Principal | ICD-10-CM

## 2023-05-11 DIAGNOSIS — R5383 Other fatigue: Principal | ICD-10-CM

## 2023-05-11 DIAGNOSIS — F32A Depression, unspecified depression type: Principal | ICD-10-CM

## 2023-05-11 MED ORDER — FENTANYL 12 MCG/HR TRANSDERMAL PATCH
MEDICATED_PATCH | TRANSDERMAL | 0 refills | 30.00 days | Status: CP
Start: 2023-05-11 — End: 2023-06-10

## 2023-05-11 NOTE — Unmapped (Signed)
OUTPATIENT ONCOLOGY PALLIATIVE CARE     Principal Diagnosis: Adam Keith is a 43 y.o. 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 He was recently admitted to [facility] with lowe extremity pain and edema. Has had episodic fluctuations in mood.  Has decided not to pursue bone marrow transplant at this time. Has felt chronically ill for the last year.  Unclear unfortunately exactly what is contributing to this, like the combination of treatment side effects and refractory depression.  Unfortunately has started to feel a little bit better with testosterone supplementation. Appreciate oncology and psychiatry assistance in management.     Assessment/Plan:      # Depressed Mood: Chronic depression, continues to feel down due to constitutional fatigue, pain, inability to go outside and do things that bring meaning to him.  Continues to follow with [name].  No safety concerns but continues to feel poorly overall.  -Medications per [name]        #Pain: Chronic, persistent pain, primarily in joints. Associated with/exacerbated by sitting too long, though he also says that activity seems to make it worst. Previously trialed butrans patch but did not find this effective. MRI of the spine showed narrowing in the lumbar region likely causing sciatica-like pain.  Trying to limit opioids, but given level of pain and distress impacting quality of life will start a low-dose fentanyl patch.    - Stopped Lyrica  - Start fentanyl patch 12 mcg/h every 72 hours  - oxycodone 10 mg q4 prn for breakthrough pain, using 4-6 times a day.  Refill sent today      #Persistent productive cough, ENT infections, low grade fevers  -Not addressed in detail today       #Chronic pain: At baseline, see above  - Pain management as above     #Advance care planning: Continues to prioritize quality of life.  After discussion with infectious disease as well as oncology has made the decision not to pursue another bone marrow transplant at this time.  Reviewed previously if he is considering not continuing cancer directed therapy.  He states that despite how poorly he has been feeling he is not at that stage yet.     #Controlled substances risk management:  Patient does not have a signed pain medication agreement with our team.  [place] database was reviewed today and it was appropriate.  Urine drug screen was not performed at this visit. Findings: not applicable.  Patient has received information about safe storage and administration of medications.  Patient has not received a prescription for narcan; is not applicable.      F/u: 6-8 weeks     ----------------------------------------  Referring Provider: Dr. Berline Lopes  Oncology Team: Malignant hematology team  PCP: [name], MD     HPI: 43 year old man with a diagnosis of B-ALL, diagnosed in October 2021, now in complete remission. Recently admitted to the [facility] ICU 03/06/21-03/20/21 for AHRF d/t MRSA/klebsiella PNA, requiring intubation. This is following admissions in August, May, April, March, February, and January 2022.     Interval History:   Is on his 5th week of testosterone shots. Feels like this has helped some, but he still feels pretty crappy overall. Unfortunately still feels pretty down and fatigued.     Has been taking Oxycodone 10mg  on average 4 times a day. Has had some worsening pain in his L foot. Planning to go to the [place] for this. Broke his R foot multiple times  in [employer].     No nausea, vomiting, constipation.       Palliative Performance Scale: 80% - Ambulation: Full / Normal Activity, some evidence of disease / Self-Care:Full / Intake: Normal / Level of Conscious: Full     Coping/Support Issues: Having a lot of difficulty coping with his current setback. Well supported by his wife. Also has a psychiatrist and therapist.     Goals of Care: Treatment and cancer-directed therapy     Social History:  Name of primary support: [name], 4 daughters and 1 son. 25, 22, 3 20 year olds.  Occupation: Was in the [employer]	[place]  Hobbies: Woodworking  Current residence / distance from [facility]: [place]     Has a 43 year old son.      Advance Care Planning: Did not discuss this visit.        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    Onion Anaphylaxis    Vancomycin Analogues Other (See Comments)       Hearing loss with Lasix     Other reaction(s): Other (See Comments)     Hearing loss     lasix     Hearing loss      lasix      Hearing loss with Lasix Other reaction(s): Other (See Comments) Hearing loss lasix    Bismuth Subsalicylate Nausea And Vomiting    Privigen [Immun Glob G(Igg)-Pro-Iga 0-50] Other (See Comments)       03/10/2021 VIG infusion stopped as patient developed rigors, tachycardia and HTN during infusion despite pre-medication; 03/12/21 completed IVIG with premedications and slower rate of infusion    Furosemide Other (See Comments)       With Vancomycin caused hearing loss     Other reaction(s): Other (See Comments)     With Vancomycin caused hearing loss      With Vancomycin caused hearing loss Other reaction(s): Other (See Comments) With Vancomycin caused hearing loss    Gammagard           Family History:  Cancer-related family history includes Cancer in his maternal grandfather and mother. There is no history of Melanoma.  He indicated that the status of his mother is unknown. He indicated that the status of his father is unknown. He indicated that the status of his sister is unknown. He indicated that the status of his brother is unknown. He indicated that the status of his maternal grandmother is unknown. He indicated that the status of his maternal grandfather is unknown. He indicated that the status of his paternal grandmother is unknown. He indicated that the status of his paternal grandfather is unknown. He indicated that the status of his maternal aunt is unknown. He indicated that the status of his maternal uncle is unknown. He indicated that the status of his paternal aunt is unknown. He indicated that the status of his paternal uncle is unknown. He indicated that the status of his neg hx is unknown.  REVIEW OF SYSTEMS:  A comprehensive review of 10 systems was negative except for pertinent positives noted in HPI.           Lab Results   Component Value Date     CREATININE 1.93 (H) 02/22/2022            Lab Results   Component Value Date     ALKPHOS 92 02/22/2022     BILITOT 0.4 02/22/2022     BILIDIR 0.20 02/07/2022     PROT 6.6 02/22/2022     ALBUMIN 4.1 02/22/2022     ALT 96 (H) 02/22/2022     AST 45 (H) 02/22/2022      Physical exam:  Gen: NAD  Pulm: nl WOB  Skin: Dry and red  Psych: Guarded and dysphoric      The patient reports they are physically located in [place] and is currently: at home. I conducted a audio/video visit. I spent  58m 49s on the video call with the patient. I spent an additional 15 minutes on pre- and post-visit activities on the date of service .       Kathlynn Grate, MD  [facility] Outpatient Oncology Palliative Care

## 2023-05-16 ENCOUNTER — Ambulatory Visit: Admit: 2023-05-16 | Payer: MEDICAID | Attending: Adult Health | Primary: Adult Health

## 2023-05-16 DIAGNOSIS — C9101 Acute lymphoblastic leukemia, in remission: Principal | ICD-10-CM

## 2023-05-16 MED ORDER — OXYCODONE 10 MG TABLET
ORAL_TABLET | ORAL | 0 refills | 20.00 days | Status: CP | PRN
Start: 2023-05-16 — End: ?

## 2023-05-20 DIAGNOSIS — C9101 Acute lymphoblastic leukemia, in remission: Principal | ICD-10-CM

## 2023-05-22 NOTE — Unmapped (Deleted)
 Rheumatology Clinic Initial Visit Note     Reason for consult: Adam Keith is seen in consultation at the request of Dr. [name] for evaluation of ***    Assessment and Plan: Adam Keith is a 43 y.o.  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 He has had prednisone trials and today reports no benefit from them (prior notes mention some response but not resolution). Will obtain imaging as below to round out work up.     We discussed these diagnoses at length today. We also discussed the unfortunate lack of disease modifying agents for them. Medications are often focused on symptomatic relief and include NSAIDs and/or other pain medications. Reviewed the critical interplay of joints and muscles and the importance of maintaining strength to maintain function. Discussed that comorbid psychiatric disease is often contributory to symptom burden. Sleep also plays a vital role in managing symptom burden. Also reviewed that joint replacement is reserved for advanced OA and that his recurrent severe MRSA infections may very well preclude him from this option give the high risk.    - XR hands, feet, hips, knees    No follow-ups on file.      Fleeta Emmer, MD  Assistant Professor of Medicine  Department of Medicine/Division of Rheumatology  [facility]    HPI: Adam Keith is a 43 y.o.  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    Patient reports that hit joints started bothering him back in 2000 when he joined the [employer]. Broke his R foot 7 time and has dealt with chronic back pain for years and has torn up some of his other joints due to compensating.    He notes that when he first underwent chemo, he dealt with a lot new issues. He says that the chronic pain got a lot worse around this time as well. This was back in 2021. He says he had to learn how to eat again and how to walk again. MRSA took around 100lbs of muscle mass from him. He has come around from a lot of this but he notes that the more he moves the more he hurts. His pain seems to have spread to all over. Used to largely been his back and LEs. Now he drops things sporadically. His legs will give out on him. He is on a pain regimen through the oncology team, this help some. He is clear that the more active he is the more it hurts.     He does have a lot of muscle pain with his back.     He feels much of  his depression comes from his pain and not being able to function like he used to.    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    Mornings are the worst. Wakes up with body aches and chills but is not running a fever. Every morning is like this. Really rough for the first hour.    At night he has to constantly move. Can't lay on his stomach or back for more than 30 minutes.    He usually takes prednisone 2-3x/year for breathing issues or blood clots or something else. He says he has a different reaction each time. He did not feel any change in his joints the last time he took this. He has had a rash to it and at times he had more pain in his joints after taking it. It has effected his taste and he lost a lot of weight.    Has had knee joint arthroscopic surgeries and tendon repairs. Also had steroid injetions.    ROS: 14 systems were reviewed and are negative except for that mentioned in the HPI.   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    Septic shock due to Staphylococcus aureus (CMS-HCC) 01/28/2020    Tear of medial meniscus of knee 10/25/2016    Testicular pain, left 01/21/2022     PSHx:   Past Surgical History:   Procedure Laterality Date    BONE MARROW BIOPSY & ASPIRATION  01/21/2020         CHG [place], CHEST,REAL TIME  11/20/2020    Procedure: ULTRASOUND, CHEST, REAL TIME WITH IMAGE DOCUMENTATION;  Surgeon: Jerelyn Charles, MD;  Location: [facility] PROC[facility];  Service: Pulmonary    IR INSERT PORT AGE GREATER THAN 5 YRS  03/10/2020    IR INSERT PORT AGE GREATER THAN 5 YRS 03/10/2020 [name], MD IMG VIR [facility]    PR BRONCHOSCOPY,DIAGNOSTIC W LAVAGE Bilateral 07/20/2020    Procedure: BRONCHOSCOPY, RIGID OR FLEXIBLE, INCLUDE FLUOROSCOPIC GUIDANCE WHEN PERFORMED; W/BRONCHIAL ALVEOLAR LAVAGE WITH MODERATE SEDATION;  Surgeon: Dellis Filbert, MD;  Location: [facility] PROCED[facility];  Service: Pulmonary     Family Hx:   Family History Problem Relation Age of Onset    Cancer Mother     No Known Problems Father     No Known Problems Sister     No Known Problems Brother     No Known Problems Maternal Aunt     No Known Problems Maternal Uncle     No Known Problems Paternal Aunt     No Known Problems Paternal Uncle     No Known Problems Maternal Grandmother     Cancer Maternal Grandfather     No Known Problems Paternal Grandmother     No Known Problems Paternal Grandfather     Melanoma Neg Hx  Basal cell carcinoma Neg Hx     Squamous cell carcinoma Neg Hx      Social Hx:   Social History     Socioeconomic History    Marital status: Married   Tobacco Use    Smoking status: Every Day     Current packs/day: 2.00     Types: Cigarettes     Passive exposure: Current    Smokeless tobacco: Former     Types: Financial planner    Vaping status: Never Used   Substance and Sexual Activity    Alcohol use: Not Currently     Comment: Rarely    Drug use: Never   Other Topics Concern    Do you use sunscreen? No    Tanning bed use? No    Are you easily burned? Yes    Excessive sun exposure? No    Blistering sunburns? Yes     Social Drivers of Psychologist, prison and probation services Strain: Low Risk  (07/04/2022)    Overall Financial Resource Strain (CARDIA)     Difficulty of Paying Living Expenses: Not hard at all   Food Insecurity: No Food Insecurity (07/04/2022)    Hunger Vital Sign     Worried About Running Out of Food in the Last Year: Never true     Ran Out of Food in the Last Year: Never true   Transportation Needs: No Transportation Needs (07/04/2022)    PRAPARE - Therapist, art (Medical): No     Lack of Transportation (Non-Medical): No    Received from [facility]    Social Network       Medications:   Current Outpatient Medications   Medication Sig Dispense Refill    albuterol HFA 90 mcg/actuation inhaler Inhale 2 puffs every six (6) hours as needed for wheezing. 18 g 0    amLODIPine (NORVASC) 10 MG tablet Take 1 tablet (10 mg total) by mouth daily. 30 tablet 0    apixaban (ELIQUIS) 5 mg Tab Take 1 tablet (5 mg total) by mouth two (2) times a day. 60 tablet 6    arm brace (WRIST BRACE) Misc 1 Piece by Miscellaneous route as needed. left ulnar wrist brace 1 each 0    carvediloL (COREG) 12.5 MG tablet Take 1 tablet (12.5 mg total) by mouth Two (2) times a day. 180 tablet 1    cholecalciferol, vitamin D3-250 mcg, 10,000 unit,, 250 mcg (10,000 unit) capsule Take 1 capsule (250 mcg total) by mouth daily. 120 capsule 3    docusate sodium (COLACE) 100 MG capsule Take 1 capsule (100 mg total) by mouth daily.      doxycycline (VIBRA-TABS) 100 MG tablet Take 1 tablet (100 mg total) by mouth two (2) times a day. 180 tablet 3    fentaNYL (DURAGESIC) 12 mcg/hr patch Place 1 patch on the skin every third day. 10 patch 0    hydroCHLOROthiazide 12.5 MG tablet Take 1 tablet (12.5 mg total) by mouth daily. 30 tablet 11    mirtazapine (REMERON) 45 MG tablet TAKE 1 TABLET BY MOUTH NIGHTLY 90 tablet 4    naloxone (NARCAN) 4 mg nasal spray One spray in either nostril once for known/suspected opioid overdose. May repeat every 2-3 minutes in alternating nostril til EMS arrives 2 each 0    nortriptyline (PAMELOR) 50 MG capsule TAKE 2 CAPSULES BY MOUTH EVERY DAY AT NIGHT 180 capsule 4    omeprazole (  PRILOSEC) 40 MG capsule Take 1 capsule (40 mg total) by mouth every morning.      ondansetron (ZOFRAN-ODT) 4 MG disintegrating tablet Take 1 tablet (4 mg total) by mouth every eight (8) hours as needed for nausea.      oxyCODONE (ROXICODONE) 10 mg immediate release tablet Take 1 tablet (10 mg total) by mouth every four (4) hours as needed for pain. 120 tablet 0    PONATinib (ICLUSIG) 30 mg tablet Take 1 tablet (30 mg total) by mouth daily. Swallow tablets whole. Do not crush, break, cut or chew tablets. 30 tablet 5    pregabalin (LYRICA) 100 MG capsule Take 1 capsule (100 mg total) by mouth Three (3) times a day. 90 capsule 3    spironolactone (ALDACTONE) 25 MG tablet TAKE 1 TABLET (25 MG TOTAL) BY MOUTH DAILY. 90 tablet 3    sulfamethoxazole-trimethoprim (BACTRIM DS) 800-160 mg per tablet Take 1 tablet (160 mg of trimethoprim total) by mouth two (2) times a day. TAKE 1 TABLET BY MOUTH 2 TIMES A DAY ON SATURDAY, SUNDAY. FOR PROPHYLAXIS 48 tablet 3    syringe with needle, safety (BD ECLIPSE LUER-LOK) 3 mL 22 gauge x 1 1/2 Syrg To be used with TC injections 50 each 0    testosterone cypionate (DEPOTESTOTERONE CYPIONATE) 200 mg/mL injection Inject 0.5 mL (100 mg total) into the muscle once a week. Single Use Vials 4 mL 5    tiotropium-olodaterol (STIOLTO RESPIMAT) 2.5-2.5 mcg/actuation Mist Inhale 2 puffs daily. 4 g 3    tiotropium-olodaterol (STIOLTO RESPIMAT) 2.5-2.5 mcg/actuation Mist Inhale 2 puffs daily. 4 g 0    tiotropium-olodaterol (STIOLTO RESPIMAT) 2.5-2.5 mcg/actuation Mist Inhale 2 puffs daily. 4 g 0    valACYclovir (VALTREX) 500 MG tablet Take 1 tablet (500 mg total) by mouth daily. 90 tablet 3     No current facility-administered medications for this visit.       Physical Exam:    There were no vitals filed for this visit.  GEN:  In no acute distress.  Well nourished and well kempt.  HEENT: No scarring alopecia. [place]/AT. EOMI, sclera white. Clear orpharynx, no nasal ulcers.  Neck:  Supple, no JVD at 90 degrees. No carotid bruit.   Lymph: No cervical or supraclavicular lymphadenopathy.  Chest: Good respiratory effort with symmetrical chest expansion.  Clear.  Cardiovascular: RRR, normal s1/s2.  2+ distal pulses equal bilaterally.  GI: Soft, NT, ND, normal bowel sounds.  MSK:     Hands: Non-tender. No swelling/synovitis. Full ROM. Able to make fist.   Wrists: Non-tender. No swelling/synovitis. Full ROM.   Elbows: Non-tender. No swelling/synovitis. Full ROM.   Shoulders: Non-tender. Full ROM.   Neck: Non-tender to mid-line palpation. Full ROM.   Back: Non-tender to mid-line palpation & SI joints. No deformities. Full ROM.   Hips: Non-tender. Full ROM.   Knees: Non-tender. No swelling/synovitis. Full ROM.   Ankles: Non-tender. No swelling/synovitis. Full ROM.   Feet: Non-tender. No swelling/synovitis. Full ROM.  Neuro: AAOx3. No focal neurologic deficits. Strength 5/5 bilaterally in upper and lower extremities.  Skin: No rashes, nodules or dry skin noted.  No petichiae or purpura.  No cyanosis or clubbing.  Psych: Normal mood and affect. Normal thought process.    I personally spent *** minutes face-to-face and non-face-to-face in the care of this patient, which includes all pre, intra, and post visit time on the date of service.  All documented time was specific to the E/M visit and does not include any procedures  that may have been performed.

## 2023-05-24 ENCOUNTER — Encounter
Admit: 2023-05-24 | Discharge: 2023-05-25 | Payer: MEDICAID | Attending: Student in an Organized Health Care Education/Training Program | Primary: Student in an Organized Health Care Education/Training Program

## 2023-05-24 DIAGNOSIS — M1380 Other specified arthritis, unspecified site: Principal | ICD-10-CM

## 2023-05-26 DIAGNOSIS — C9101 Acute lymphoblastic leukemia, in remission: Principal | ICD-10-CM

## 2023-05-26 NOTE — Unmapped (Signed)
 Rheumatology Phone Visit    Visit was originally scheduled as a new patient in person appointment but switched to video visit due to inclement weather. He then was unable to connect to the video so we had a phone visit.    This visit is conducted via telephone.    Contact Information  Person Contacted: Patient  Contact Phone number: [phone] (work)  Is there someone else in the room? No.   Patient agreed to a phone visit    Adam Keith is a 43 y.o. male  participating in a telephone encounter.    Reason for Call  Evaluation of inflammatory arthritis    Subjective:  Patient reports that hit joints started bothering him back in 2000 when he joined the [employer]. Broke his R foot 7 time and has dealt with chronic back pain for years and has torn up some of his other joints due to compensating.     He notes that when he first underwent chemo, he dealt with a lot new issues. He says that the chronic pain got a lot worse around this time as well. This was back in 2021. He says he had to learn how to eat again and how to walk again. MRSA took around 100lbs of muscle mass from him. He has come around from a lot of this but he notes that the more he moves the more he hurts. His pain seems to have spread to all over. Used to largely been his back and LEs. Now he drops things sporadically. His legs will give out on him. He is on a pain regimen through the oncology team, this help some. He is clear that the more active he is the more it hurts.      He does have a lot of muscle pain with his back.      He feels much of his depression comes from his pain and not being able to function like he used to.     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     Mornings are the worst. Wakes up with body aches and chills but is not running a fever. Every morning is like this. Really rough for the first hour.     At night he has to constantly move. Can't lay on his stomach or back for more than 30 minutes.     He usually takes prednisone 2-3x/year for breathing issues or blood clots or something else. He says he has a different reaction each time. He did not feel any change in his joints the last time he took this. He has had a rash to it and at times he had more pain in his joints after taking it. It has effected his taste and he lost a lot of weight.     Has had knee joint arthroscopic surgeries and tendon repairs. Also had steroid injetions.    I have reviewed the problem list, medications, and allergies and have updated/reconciled them if needed.    Objective:  No distress  Speech intact and appropriate  No labored breathing, able to speak in full sentences  Pleasant      Assessment & Plan:  1. Oligoarthritis             Adam Keith is a 44 y.o.  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 He has had prednisone trials and today reports no benefit from them (prior notes mention some response but not resolution). Will obtain imaging as below to round out work up. May need to see him in person to also complete evaluation.     We discussed these diagnoses at length today. We also discussed the unfortunate lack of disease modifying agents for them. Medications are often focused on symptomatic relief and include NSAIDs and/or other pain medications. Reviewed the critical interplay of joints and muscles and the importance of maintaining strength to maintain function. Discussed that comorbid psychiatric disease is often contributory to symptom burden. Sleep also plays a vital role in managing symptom burden. Also reviewed that joint replacement is reserved for advanced OA and that his recurrent severe MRSA infections may very well preclude him from this option give the high risk.     - XR hands, feet, hips, knees    No follow-ups on file.       The patient reports they are physically located in [place] and is currently: at home. I conducted a phone visit.  I spent 64 minutes on the phone call with the patient on the date of service .

## 2023-06-06 DIAGNOSIS — C9101 Acute lymphoblastic leukemia, in remission: Principal | ICD-10-CM

## 2023-06-06 NOTE — Unmapped (Signed)
 [facility] Refill Coordination Note    Adam Keith, DOB: 1980-12-24  Phone: [phone] (work)      All above HIPAA information was verified with patient.         06/04/2023    12:10 PM   Specialty Rx Medication Refill Questionnaire   Which Medications would you like refilled and shipped? Ponatnib   Please list all current allergies: See MyChart   Have you missed any doses in the last 30 days? No   Have you had any changes to your medication(s) since your last refill? No   How many days remaining of each medication do you have at home? 6 days   Have you experienced any side effects in the last 30 days? No   Please enter the full address (street address, city, state, zip code) where you would like your medication(s) to be delivered to. [address] [place] , [place] [address]   Please specify on which day you would like your medication(s) to arrive. Note: if you need your medication(s) within 3 days, please call the pharmacy to schedule your order at [phone]  06/09/2023   Has your insurance changed since your last refill? No   Would you like a pharmacist to call you to discuss your medication(s)? No   Do you require a signature for your package? (Note: if we are billing Medicare Part B or your order contains a controlled substance, we will require a signature) No         Completed refill call assessment today to schedule patient's medication shipment from the [facility] [phone]).  All relevant notes have been reviewed.       Confirmed patient received a Conservation officer, historic buildings and a Surveyor, mining with first shipment. The patient will receive a drug information handout for each medication shipped and additional FDA Medication Guides as required.         REFERRAL TO PHARMACIST     Referral to the pharmacist: Not needed      [facility]     Shipping address confirmed in Epic.     Delivery Scheduled: Yes, Expected medication delivery date: 06/09/23.     Medication will be delivered via UPS to the prescription address in Epic [place].    [name]   [facility] Specialty Technician

## 2023-06-08 MED FILL — ICLUSIG 30 MG TABLET: ORAL | 30 days supply | Qty: 30 | Fill #3

## 2023-06-09 ENCOUNTER — Ambulatory Visit: Admit: 2023-06-09 | Payer: MEDICAID | Attending: Adult Health | Primary: Adult Health

## 2023-06-09 NOTE — Unmapped (Unsigned)
 Attempted to contact patient via mychart video request as well as telephone, no response. Left voicemail for patient to log onto mychart if they get this message within the next 30 minutes, or contact the urology office to reschedule. erections.  This medication is best on an empty stomach.  We also discussed he may not takes nitrates for chest pain with this medication and that he should go to the ED if he has an erection lasting more than four hours.   - rtc 3 months with repeat labs   (briefly discussed ICI, but declined for now)     Counseling:    We discussed that options include observation with implementation of dietary/exercise strategies versus testosterone replacement (with gel, injections, pellets, etc) and alternative therapies such as Clomid and/or Arimidex to increase natural production. We also discussed that losing weight is a means of naturally improving testosterone levels. Studies suggest dietary modifications including drinking less alcohol, and eating a well-balanced diet with lots of vegetables can also help.  Also stress management and improved sleep habits were discussed.  I suggested a trial of 6 months of diet/exercise strategies prior to medical therapy.    We reviewed risks of TRT including but not limited to acne, fluid retention, BPH or urinary symptoms, breast enlargement, worsening of sleep apnea, infertility, changes in blood count requiring phlebotomy, changes in cholesterol requiring additional medical therapy, blood clots including deep venous thrombosis or thromboembolic events, and prostate cancer progression.  We also discussed recent literature suggesting an increase in cardiovascular events (including heart attack/stroke) and mortality, but we also reviewed that there is conflicting literature in this regard.  He understands these risks and wishes to proceed.  We also reviewed the AUA's Position Statement on testosterone therapy with him and the new FDA warning, and these statements were provided in writing.    We discussed that he will need to be followed at regular intervals for physical exam (including digital rectal exam), reassessment of symptoms, and lab work including PSA, hormone levels, and complete blood counts.  After being fully advised of these risks and expressing understanding, he would like to pursue therapy.  He was given a handout delineating the importance of regular follow-up and risks of testosterone replacement therapy, including but not limited to those noted above.           Referring Provider:  NP Mellody Life    PCP:   Dr. Sampson Goon     Reason for Visit:  ED/ Hypogonadism     HPI:   Mr. Snee  is a pleasant 43 y.o. year-old male who is being seen today in consultation at the request of NP Dellis Anes for ED/ hypogonadism.  History of Ph + B-ALL.  After review of records, reported fatigue to oncology. Had T drawn and was found to be in the 200s.     He was prescribed testosterone 9/26, but states he hasn't been able to get the medication due to it needing prior authorization.     Using [facility], states he's tried cialis 5 mg and then most recently viagra 100 mg. Married monogamous. States last time he was able to have an erection was about two years ago. States he has desire, but certainly not able to even achieve a nocturnal erection.     Has extreme fatigue; barely feeling like he can get up out of bed.      Latest Reference Range & Units 12/08/22 11:04   Testosterone (Mayo) 240 - 950 ng/dL 811 (L)   (  L): Data is abnormally low      Interval 06/09/23:     He returns today for follow up, on TC 100 mg weekly for hypogonadism.       Component      Latest Ref Rng 01/09/2023 01/24/2023 03/20/2023   Testosterone, Free      4.46 - 17.1 ng/dL  1.30 (L)     Testosterone (Mayo)      240 - 950 ng/dL  865 (L)     Testosterone      197 - 670 ng/dL 38 (L)   784 (L)    Estradiol      pg/mL 30.8      Prolactin      ng/mL 14.8      LH      mIU/mL 3.6  4.1     Prostate Specific Antigen      0.00 - 4.00 ng/mL 0.32      HCT      39.0 - 48.0 % 48.0      FSH      mIU/mL  18.8        Legend:  (L) Low    Today, he reports that he is doing well. Today, he denies fever, chills, chest pain, shortness of breath, cough, wheezing, nausea, vomiting, abdominal pain, flank pain, diarrhea, constipation, dysuria, hematuria, rashes, open wounds, edema, and weakness or numbness in the upper or lower extremities.     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    Septic shock due to Staphylococcus aureus (CMS-HCC) 01/28/2020    Tear of medial meniscus of knee 10/25/2016    Testicular pain, left 01/21/2022       Past surgical history:  Past Surgical History:   Procedure Laterality Date    BONE MARROW BIOPSY & ASPIRATION  01/21/2020         CHG [place], CHEST,REAL TIME  11/20/2020    Procedure: ULTRASOUND, CHEST, REAL TIME WITH IMAGE DOCUMENTATION;  Surgeon: Jerelyn Charles, MD;  Location: BRONCH PROCEDURE LAB [facility];  Service: Pulmonary    IR INSERT PORT AGE GREATER THAN 5 YRS  03/10/2020    IR INSERT PORT AGE GREATER THAN 5 YRS 03/10/2020 [name], MD IMG VIR H&V [facility]    PR BRONCHOSCOPY,DIAGNOSTIC W LAVAGE Bilateral 07/20/2020    Procedure: BRONCHOSCOPY, RIGID OR FLEXIBLE, INCLUDE FLUOROSCOPIC GUIDANCE WHEN PERFORMED; W/BRONCHIAL ALVEOLAR LAVAGE WITH MODERATE SEDATION;  Surgeon: Dellis Filbert, MD;  Location: BRON[facility] PROCEDURE LAB [facility];  Service: Pulmonary       Social history:  Social History     Social History Narrative    Not on file   Licensed conveyancer     Family history:  family history includes Cancer in his maternal grandfather and mother; No Known Problems in his brother, father, maternal aunt, maternal grandmother, maternal uncle, paternal aunt, paternal grandfather, paternal grandmother, paternal uncle, and sister.    MEDICATIONS:   Current Outpatient Medications   Medication Sig Dispense Refill    albuterol HFA 90 mcg/actuation inhaler Inhale 2 puffs every six (6) hours as needed for wheezing. 18 g 0    amLODIPine (NORVASC) 10 MG tablet Take 1 tablet (10 mg total)  by mouth daily. 30 tablet 0    apixaban (ELIQUIS) 5 mg Tab Take 1 tablet (5 mg total) by mouth two (2) times a day. 60 tablet 6    arm brace (WRIST BRACE) Misc 1 Piece by Miscellaneous route as needed. left ulnar wrist brace 1 each 0    carvediloL (COREG) 12.5 MG tablet Take 1 tablet (12.5 mg total) by mouth Two (2) times a day. 180 tablet 1    cholecalciferol, vitamin D3-250 mcg, 10,000 unit,, 250 mcg (10,000 unit) capsule Take 1 capsule (250 mcg total) by mouth daily. 120 capsule 3    docusate sodium (COLACE) 100 MG capsule Take 1 capsule (100 mg total) by mouth daily.      doxycycline (VIBRA-TABS) 100 MG tablet Take 1 tablet (100 mg total) by mouth two (2) times a day. 180 tablet 3    fentaNYL (DURAGESIC) 12 mcg/hr patch Place 1 patch on the skin every third day. 10 patch 0    hydroCHLOROthiazide 12.5 MG tablet Take 1 tablet (12.5 mg total) by mouth daily. 30 tablet 11    mirtazapine (REMERON) 45 MG tablet TAKE 1 TABLET BY MOUTH NIGHTLY 90 tablet 4    naloxone (NARCAN) 4 mg nasal spray One spray in either nostril once for known/suspected opioid overdose. May repeat every 2-3 minutes in alternating nostril til EMS arrives 2 each 0    nortriptyline (PAMELOR) 50 MG capsule TAKE 2 CAPSULES BY MOUTH EVERY DAY AT NIGHT 180 capsule 4    omeprazole (PRILOSEC) 40 MG capsule Take 1 capsule (40 mg total) by mouth every morning.      ondansetron (ZOFRAN-ODT) 4 MG disintegrating tablet Take 1 tablet (4 mg total) by mouth every eight (8) hours as needed for nausea.      oxyCODONE (ROXICODONE) 10 mg immediate release tablet Take 1 tablet (10 mg total) by mouth every four (4) hours as needed for pain. 120 tablet 0    PONATinib (ICLUSIG) 30 mg tablet Take 1 tablet (30 mg total) by mouth daily. Swallow tablets whole. Do not crush, break, cut or chew tablets. 30 tablet 5    pregabalin (LYRICA) 100 MG capsule Take 1 capsule (100 mg total) by mouth Three (3) times a day. 90 capsule 3    spironolactone (ALDACTONE) 25 MG tablet TAKE 1 TABLET (25 MG TOTAL) BY MOUTH DAILY. 90 tablet 3    sulfamethoxazole-trimethoprim (BACTRIM DS) 800-160 mg per tablet Take 1 tablet (160 mg of trimethoprim total) by mouth two (2) times a day. TAKE 1 TABLET BY MOUTH 2 TIMES A DAY ON SATURDAY, SUNDAY. FOR PROPHYLAXIS 48 tablet 3    syringe with needle, safety (BD ECLIPSE LUER-LOK) 3 mL 22 gauge x 1 1/2 Syrg To be used with TC injections 50 each 0    testosterone cypionate (DEPOTESTOTERONE CYPIONATE) 200 mg/mL injection Inject 0.5 mL (100 mg total) into the muscle once a week. Single Use Vials 4 mL 5    tiotropium-olodaterol (STIOLTO RESPIMAT) 2.5-2.5 mcg/actuation Mist Inhale 2 puffs daily. 4 g 3    tiotropium-olodaterol (STIOLTO RESPIMAT) 2.5-2.5 mcg/actuation Mist Inhale 2 puffs daily. 4 g 0    tiotropium-olodaterol (STIOLTO RESPIMAT) 2.5-2.5 mcg/actuation Mist Inhale 2 puffs daily. 4 g 0    valACYclovir (VALTREX) 500 MG tablet Take 1 tablet (500 mg total) by mouth daily. 90 tablet 3     No current facility-administered medications for this visit.       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    Onion Anaphylaxis    Vancomycin Analogues Other (See Comments)     Hearing loss with Lasix    Other reaction(s): Other (See Comments)    Hearing loss    lasix    Hearing loss      lasix      Hearing loss with Lasix Other reaction(s): Other (See Comments) Hearing loss lasix    Bismuth Subsalicylate Nausea And Vomiting    Privigen [Immun Glob G(Igg)-Pro-Iga 0-50] Other (See Comments)     03/10/2021 VIG infusion stopped as patient developed rigors, tachycardia and HTN during infusion despite pre-medication; 03/12/21 completed IVIG with premedications and slower rate of infusion    Furosemide Other (See Comments)     With Vancomycin caused hearing loss    Other reaction(s): Other (See Comments)    With Vancomycin caused hearing loss      With Vancomycin caused hearing loss Other reaction(s): Other (See Comments) With Vancomycin caused hearing loss    Gammagard        Review of Systems:  Positive for fatigue.  Otherwise, a 10 system ROS questionnaire completed by the patient and reviewed by me was normal.    Physical Exam  There were no vitals filed for this visit.    GENERAL: The patient is a pleasant, male in no acute distress.

## 2023-06-12 DIAGNOSIS — C9101 Acute lymphoblastic leukemia, in remission: Principal | ICD-10-CM

## 2023-06-13 ENCOUNTER — Encounter: Admit: 2023-06-13 | Discharge: 2023-06-14 | Payer: MEDICAID

## 2023-06-13 DIAGNOSIS — R7989 Other specified abnormal findings of blood chemistry: Principal | ICD-10-CM

## 2023-06-13 DIAGNOSIS — F331 Major depressive disorder, recurrent, moderate: Principal | ICD-10-CM

## 2023-06-13 DIAGNOSIS — F4310 Post-traumatic stress disorder, unspecified: Principal | ICD-10-CM

## 2023-06-13 DIAGNOSIS — F419 Anxiety disorder, unspecified: Principal | ICD-10-CM

## 2023-06-13 NOTE — Unmapped (Signed)
 Comprehensive Cancer Support Program (CCSP) - Psychiatry Outpatient Clinic   After Visit Summary    It was a pleasure to see you today in the [facility]s Comprehensive Cancer Support Program (CCSP). The CCSP is a multidisciplinary program dedicated to helping patients, caregivers, and families with cancer treatment, recovery and survivorship.      To schedule, cancel, or change your appointment:  Please call the [facility] schedulers at [phone], Monday through Friday 8AM - 5PM.  Someone will return your call within 24 hours.      If you have a question about your medicines or you need to contact your provider:  First, try sending a My Chart message to [name], PMHNP. If you are unable to do so, please call the CCSP program coordinator, [facility], at [phone].     For after hours urgent issues, you may call [phone] or call the I need to talk line at [phone] ([phone] anytime 24/7.    CCSP Patient and Family Resource Center: [phone].    CCSP Website:  [email or web address]    For prescription refills, please allow at least 24 hours (during business hours, M-F) for providers to call in refills to your pharmacy. We are generally unable to accommodate same-day requests for refills.     If you are taking any controlled substances (such as anxiety or sleep medications), you must use them as the directions say to use them. We generally do not provide early refills over the phone without clear reason, and it would be inappropriate to obtain the medications from other doctors. We routinely use the [place] controlled substance database to monitor prescription drug use.

## 2023-06-13 NOTE — Unmapped (Signed)
 [facility]  Psychiatry--Comprehensive Cancer Support Program   Established Patient E&M Service - Outpatient       Assessment:    Adam Keith presents for follow-up evaluation. He states that he has poor quality of life due to profound fatigue, ongoing pain, and feeling awful all over. Hypersomnia is an ongoing issue, so we decided to wean him off of the mirtazapine to see if this improves his excessive sleep and fatigue. Continuing his nortriptyline 100 mg at bedtime and will plan to reassess on 07/11/23 to see if a different augmentation strategy is needed.     Identifying Information:  Adam Keith is a 43 y.o. male with a history of PH+ B cell ALL, recurrent major depressive disorder, PTSD, anxiety, and insomnia. He has had a long struggle with intermittent depressive episodes with one suicide attempt at age 89.      Risk Assessment:  An assessment of suicide and violence risk factors was performed as part of this evaluation and is not significantly increased from the last visit.   While future psychiatric events cannot be accurately predicted, the patient does not currently require acute inpatient psychiatric care and does not currently meet [facility] involuntary commitment criteria.      Plan:    Problem 1: Major Depressive Disorder  Status of problem: chronic with moderate exacerbation  Interventions:   Continue nortriptyline 100 mg at bedtime.    Decrease mirtazapine from 45 mg to 1/2 tablet for 5 nights before stopping this medication. Stopping this medication due to hypersomnia at night. We discussed plans to re-evaluate in 4 weeks and pursue a different medication strategy, if needed.    Encouraged psychotherapy with local therapist     Problem 2: Anxiety/PTSD  Status of problem: chronic with mild exacerbation  Interventions:   See MDD above     Problem 3: Insomnia  Status of problem:  hypersomnia  Interventions:   See mirtazapine plan above      Psychotherapy provided:  No billable psychotherapy service provided.    Patient has been given this writer's contact information as well as the [place] [facility] urgent line number. The patient has been instructed to call [phone] for emergencies.      Subjective:    Chief complaint:  Follow-up psychiatric evaluation for depression and anxiety    Interval History:   He presents with ongoing general malaise, difficulty waking up in the morning, and uncontrolled pain. He reports feeling 'awful all over' and 'not feeling good all day, every day, all night.' He reports ongoing depression and frustration that he never feels well. He denies having any panic attacks but expresses feeling at his wit's end.    The patient is on testosterone therapy, which was providing good results, but he is worried that it is not as effective of late. Ongoing profound fatigue reported.     The patient is currently on nortriptyline and mirtazapine at bedtime but reports difficulty waking up in the morning. He describes it taking almost two hours to get out of bed after waking up, feeling like he's only been asleep for five minutes.    Prior Psychiatric Medication Trials: bupropion (could not tolerate), duloxetine, mirtazapine (given during recent hospitalization), lorazepam, clonazepam, methylphenidate (jittery)     Psychiatric Review of Systems  Depressive Symptoms: depressed mood and profound fatigue  Manic Symptoms: N/A  Psychosis Symptoms: N/A  Anxiety Symptoms: excessive anxiety at times, ruminations  Panic Symptoms: denies  PTSD Symptoms: denies  Sleep disturbance:  hypersomnia  Exercise: Unable to engage in a daily exercise routine.      Objective:      Mental Status Exam:  Appearance:    Appears stated age, Well nourished, and Clean/Neat   Motor:   No abnormal movements   Speech/Language:    Normal rate, volume, tone, fluency   Mood:   Depressed   Affect:   Blunted, Calm, and Cooperative   Thought process and Associations:   Logical, linear, clear, coherent, goal directed   Abnormal/psychotic thought content:     Denies SI, HI, self harm, delusions, obsessions, paranoid ideation, or ideas of reference   Perceptual disturbances:     Denies auditory and visual hallucinations, behavior not concerning for response to internal stimuli     Other:   Insight and judgment intact     I personally spent 30 minutes face-to-face and non-face-to-face in the care of this patient, which includes all pre, intra, and post visit time on the date of service.    Visit was completed by video (or phone) and the appropriate disclaimer has been included below.    The patient reports they are physically located in [place] and is currently: at home. I conducted a audio/video visit. I spent 15 minutes on the video call with the patient. I spent an additional 15 minutes on pre- and post-visit activities on the date of service .        Sheran Lawless, PMHNP  06/13/2023

## 2023-06-13 NOTE — Unmapped (Signed)
 [facility]    Patient Name: Adam Keith  Patient Age: 43 y.o.  Encounter Date: 06/14/2023    Primary Care Provider:  Dierdre Harness, MD    Referring Physician:  Dario Ave  [address]  [id number] [facility] [place],  [place] [address]    Cancer Diagnosis: Ph+ ALL; Initial Dx 01/2020  Cancer status: MRD-neg remission  Treatment Regimen: Third Line, ponatinib monotherapy   Treatment Goal: Curative  Comorbidities: COPD, OSA, DVT/PE  Transplant: Discussed, but did not want to proceed    Assessment:  A 43 y.o. 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 The course was complicated by septic shock, candida krusei fungemia, MRSA bacteremia with septic emboli c/b acute renal failure and respiratory distress requiring dialysis and intubation.  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  He subsequently had difficulty tolerating single agent dasatinib (as a bridge to planned ponatinib-blinatumomab--had fluid retention and pleural effusion).  He then completed 4 cycles of ponatinib-blinatumomab, initiated for treatment of MRD in the setting of poor tolerance of standard therapy.      He is currently on ponatinib 30 mg monotherapy (on monotherapy since 01/2021).  When we decreased his ponatinib to 15 mg, he had a rise in transcripts. Most recent BCR-ABL is + at 0.002. Now, BCR/ABL is negative on 30 mg.   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 Deep remission maintained with BCR-ABL levels at 0.006 or less since April 2022. Considering nilotinib or asciminib if ponatinib is confirmed as the cause of symptoms.  - Hold ponatinib for one week to assess symptom changes - then restart at 30 mg again  - Draw BCR-ABL in one month.  - Schedule a video visit in six weeks to discuss results and next steps.    Fatigue with BL symmetric oligoarthritis of shoulders, hips and knees and increased inflammatory markers: Multifactorial etiology for fatigue. Could be related to known depression, COPD, OSA unable to tolerate CPAP, Vit D deficiency, prior infections and male hypogonadism. Ruled out polymyalgia rheumatica vs other inflammatory processes vs side effects from TKI vs chronic infectious process.   - Hold ponatinib as above for 1 week and re-evaluate     Depression and anxiety  Reports feeling awful with no motivation or energy. Under psychiatric care with recent mirtazapine dose reduction. Suspects depression secondary to chemotherapy.  - Continue psychiatric follow-up and current medication regimen.    Chronic back pain  Chronic back pain managed under palliative care. Stopped pregabalin, using fentanyl patch and oxycodone for breakthrough pain.  - Continue follow-up with palliative care every six to eight weeks.    Chronic obstructive pulmonary disease (COPD)  Shortness of breath attributed to smoking. Attempting to reduce smoking with nicotine pouches.  - Encourage smoking cessation efforts.    Elevated hemoglobin  Hemoglobin elevated at 18.6, likely due to testosterone therapy and smoking, increasing risk of blood clots.  - Monitor hemoglobin levels and discuss with urologist managing testosterone therapy.    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   - No plan for further IVIG given several reactions   - Rec Flu and Covid vaccines     DRESS: followed by Dr. 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 Now s/p dialysis  - stable Cr appears to have developed CKD due to incompletely healed acute injury.  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 CT chest 02/06/22 with residual subacute thrombus.   - Indefinite anticoagulation with apixaban (in addition to asa given ponatinib)     Vitamin D Deficiency: was low 07/2022.   - started supplementation 07/2022.   - He has not noticed any change in symptoms with it. Will recheck level in 1 month.     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 [name] and Dr. Verlan Friends and Dr. Maple Mirza following patient.    - long term doxycycline with goal of decolonization  **Hx Candidemia during induction - now s/p Cresemba  - followed by Dr. [name]  **Hx Covid-19 x 4 documented   - possible long-Covid symptoms    Symptom management: bowel regimen as listed in medication list, analgesics as listed in medication list or symptoms reviewed and due to acceptable control, no changes made  **Peripheral neuropathy, sensory and motor  - Grade 1-2 monitor  **Chronic Back Pain, complicated by hospital stay and procedures  - Followed by Dr. Genice Rouge, Palliative Care  **Depression/anxiety: following with [name], [facility]  - management per psychiatry     Venous access: I recommend the following change for venous access : PICC line removal      Supportive Care Recommendations:  We recommend based on the patient???s underlying diagnosis and treatment history the following supportive care:    1. Antimicrobial prophylaxis:   - Primary management per ICID  - On daily doxy for recurrent MRSA pna    - Viral - valacyclovir 500mg  daily  - Bacterial - levofloxacin 500 mg if ANC <0.5  - Fungal - previously on treatment isavuconazole - off now  - PJP - On Bactrim DS BID Sat/Sun  - recommend Annual flu shot - declines thus far  - recommend updated Covid vaccine - declines    2. Blood product support:  I recommend the following intervals for laboratory monitoring to determine transfusion needs and monitor for hematologic effects of therapy and underlying disease: no routine monitoring outside of clinic follow-up    Leukoreduced blood products are required.  Irradiated blood products are preferred, but in case of urgent transfusion needs non-irradiated blood products may be used:     -  RBC transfusion threshold: transfuse 2 units for Hgb < 8 g/dL.  -  Platelet transfusion threshold: transfuse 1 unit of platelets for platelet count < 10, or for bleeding or need for invasive procedure.    3. Hematopoietic growth factor support: none    Care coordination: The next [facility] Follow up requested:  - 6w video visit to reassess - will plan for nilotinib if symptoms improve off ponatinib     Mariel Aloe, MD  Leukemia Program  Division of Hematology  [facility]     Nurse Navigator (non-clinical trial patients): Luiz Iron,  RN       Tel. [phone]       Fax. [phone]  Toll-free appointments: [phone]  Scheduling assistance: [phone]  After hours/weekends: [phone] (ask for adult hematology/oncology on-call)      History of Present Illness:  We had the pleasure of seeing Mycah Formica in the Leukemia Clinic at the [place] [place] on 06/14/2023.  He is a 43 y.o. male with  Ph+ B-ALL .  He is currently on ponatinib, a third-generation TKI, and has been in deep remission since July, with BCR-ABL levels consistently low. He feels that his chemotherapy might be contributing to his fatigue, as he feels slightly better when he misses doses.    He experiences profound fatigue and a general feeling of being 'wiped out' daily. He sleeps excessively, up to 12-16 hours if not prompted to wake up, and lacks motivation and energy. Even minor activities lead to a significant energy crash, likening it to having the flu. Despite being able to work, he finds it challenging to perform tasks and often sits at a desk for extended periods without engaging in physical activity.    For pain management, he previously stopped Lyrica and started using a fentanyl patch and oxycodone for breakthrough pain. He continues to follow up with Doctor Genice Rouge for palliative care every six to eight weeks. Despite these treatments, he reports no significant improvement in pain.    He has a history of depression and anxiety, for which he recently saw [name] for a psychiatric evaluation. He feels 'awful all over' and experiences frustration. His mirtazapine dosage was reduced from 45 mg to half a tablet and then stopped, as it did not provide significant benefit. He is also on testosterone therapy, which initially provided some improvement, but he is concerned about its current effectiveness.    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 He smokes approximately one and a half packs of cigarettes per day and is attempting to cut back. He has tried nicotine pouches to aid in reducing smoking. He reports shortness of breath, which he attributes to smoking.    No chest pain. He acknowledges smoking as a contributing factor to his symptoms.          His oncologic history is as follows:    Hematology/Oncology History Overview Note   Referring/Local Oncologist: None    Diagnosis:Ph+ ALL    Genetics:    Karyotype/FISH:Abnormal Karyotype: 46,XY,t(9;22)(q34;q11.2)[1]/45,XY,der(7;9)(q10;q10)t(9;22)(q34;q11.2),der(22)t(9;22)[11]/46,sdl,+der(22)t(9;22)[5]/46,XY[3]     Abnormal FISH: A BCR/ABL1 interphase FISH assay shows an abnormal signal pattern in 97% of the 100 cells scored. 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  The findings support a diagnosis of leukemia and have implications for targeted therapy and for monitoring residual disease.      Molecular Genetics:  BCR-ABL1 p210 transcripts were detected at a level of 46.479 IS% ratio in bone marrow.  BCR-ABL1 p190 transcripts were detected at a level of 4 in 100,000 cells in bone marrow.    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)     01/24/2020 - 04/02/2020 Chemotherapy    IP/OP LEUKEMIA GRAAPH-2005 +  RITUXIMAB < 60 YO  rituximab hypercvad (odd and even course)     02/24/2020 Remission    CR with PCR MRD+; marrow showing 70% blasts with <1% blasts, negative flow MRD, BCR-ABL p210 PCR 0.197%; CNS negative for blasts     03/09/2020 Progression    CSF - rare blast ID'ed - IT chemo given    03/13/20 - IT chemo, CSF negative     04/16/2020 Biopsy    BM Bx with 40-50% cellularity, <1% blasts, MRD flow cytometry negative, BCR-ABL p210 transcripts 0.036%     05/28/2020 - 05/28/2020 Chemotherapy    OP AML - CNS THERAPY (INTRATHECAL CYTARABINE, INTRATHECAL METHOTREXATE, OR INTRATHECAL TRIPLE)  Select one of the following: cytarabine IT 100 mg with hydrocortisone 50 mg, cytarabine IT 40 mg with methotrexate 15 mg with hydrocortisone 50 mg, OR methotrexate IT 12 mg with hydrocortisone 50 mg     06/19/2020 -  Chemotherapy    IP/OP LEUKEMIA BLINATUMOMAB 7-DAY INFUSION (MINIMAL RESIDUAL DISEASE; WT >= 22 KG) (HOME INFUSION)      Cycles 1*-4: Blinatumomab 28 mcg/day Days 1-28 of 6-week cycle.  *Given in the inpatient setting on Days 1-3 on Cycle 1 and Days 1-2 on Cycle 2, while other treatment days are given in the outpatient setting.    Cycle 1 MRD Dosing     07/18/2020 Adverse Reaction    Hospitalization: fevers. Pneumonia     07/23/2020 Biopsy    Diagnosis  Bone marrow, right iliac, aspiration and biopsy  -   Normocellular bone marrow (50%) with trilineage hematopoiesis and 1% blasts by manual aspirate differential  -   Flow cytometry MRD analysis reveals no definitive immunophenotypic evidence of residual B lymphoblastic leukemia   - BCR-ABL p210 0.006%         08/10/2020 -  Chemotherapy    Cycle 2 blinatumomab-ponatinib  Ponatinib 30mg     1 IT per cycle     09/21/2020 Adverse Reaction    Covid-19, symptomatic but not requiring hospitalization.     10/06/2020 -  Chemotherapy    Cycle 3 blinatumomab-ponatinib  Ponatinib 30mg        11/01/2020 Adverse Reaction    Hospitalization: MRSA Pneumonia requiring intubation    Blinatumomab stopped.  Ponatinib held until he stabilized.       12/14/2020 -  Chemotherapy    Cycle 4 blinatumomab 28 mcg/day days 1-28, ponatinib 30 mg per day IT triple therapy day 29     01/13/2021 -  Chemotherapy    Ponatinib 30mg  daily  - due to due low level BCR-ABL     03/06/2021 Adverse Reaction    Hospitalization - ICU on ventilator - influenza and MRSA/klebsiella PNA     06/13/2022 -  Chemotherapy    OP LEUKEMIA VINCRISTINE  vinCRIStine 2 mg IV on day 1     ALL (acute lymphoblastic leukemia) (CMS-HCC)   06/11/2020 -  Chemotherapy    IP/OP LEUKEMIA BLINATUMOMAB 7-DAY INFUSION (MINIMAL RESIDUAL DISEASE; WT >= 22 KG) (HOME INFUSION)  Cycles 1*-4: Blinatumomab 28 mcg/day Days 1-28 of 6-week cycle.  *Given in the inpatient setting on Days 1-3 on Cycle 1 and Days 1-2 on Cycle 2, while other treatment days are given in the outpatient setting.     09/21/2020 Initial Diagnosis    ALL (acute lymphoblastic leukemia) (CMS-HCC)     06/13/2022 -  Chemotherapy    OP LEUKEMIA VINCRISTINE  vinCRIStine 2 mg IV on day 1     Acute lymphoblastic leukemia in remission (CMS-HCC)  06/11/2020 -  Chemotherapy    IP/OP LEUKEMIA BLINATUMOMAB 7-DAY INFUSION (MINIMAL RESIDUAL DISEASE; WT >= 22 KG) (HOME INFUSION)  Cycles 1*-4: Blinatumomab 28 mcg/day Days 1-28 of 6-week cycle.  *Given in the inpatient setting on Days 1-3 on Cycle 1 and Days 1-2 on Cycle 2, while other treatment days are given in the outpatient setting.     11/09/2020 Initial Diagnosis    Acute lymphoblastic leukemia in remission (CMS-HCC)     06/13/2022 -  Chemotherapy    OP LEUKEMIA VINCRISTINE  vinCRIStine 2 mg IV on day 1           Past Medical, Surgical and Family History were reviewed and pertinent updates were made in the Electronic Medical Record      ECOG Performance Status: 1    Medications:      Current Outpatient Medications   Medication Sig Dispense Refill    amLODIPine (NORVASC) 10 MG tablet Take 1 tablet (10 mg total) by mouth daily. 30 tablet 0    apixaban (ELIQUIS) 5 mg Tab Take 1 tablet (5 mg total) by mouth two (2) times a day. 60 tablet 6    arm brace (WRIST BRACE) Misc 1 Piece by Miscellaneous route as needed. left ulnar wrist brace 1 each 0    carvediloL (COREG) 12.5 MG tablet Take 1 tablet (12.5 mg total) by mouth Two (2) times a day. 180 tablet 1    cholecalciferol, vitamin D3-250 mcg, 10,000 unit,, 250 mcg (10,000 unit) capsule Take 1 capsule (250 mcg total) by mouth daily. 120 capsule 3    docusate sodium (COLACE) 100 MG capsule Take 1 capsule (100 mg total) by mouth daily.      doxycycline (VIBRA-TABS) 100 MG tablet Take 1 tablet (100 mg total) by mouth two (2) times a day. 180 tablet 3    hydroCHLOROthiazide 12.5 MG tablet Take 1 tablet (12.5 mg total) by mouth daily. 30 tablet 11    naloxone (NARCAN) 4 mg nasal spray One spray in either nostril once for known/suspected opioid overdose. May repeat every 2-3 minutes in alternating nostril til EMS arrives 2 each 0    nortriptyline (PAMELOR) 50 MG capsule TAKE 2 CAPSULES BY MOUTH EVERY DAY AT NIGHT 180 capsule 4    omeprazole (PRILOSEC) 40 MG capsule Take 1 capsule (40 mg total) by mouth every morning.      ondansetron (ZOFRAN-ODT) 4 MG disintegrating tablet Take 1 tablet (4 mg total) by mouth every eight (8) hours as needed for nausea.      oxyCODONE (ROXICODONE) 10 mg immediate release tablet Take 1 tablet (10 mg total) by mouth every four (4) hours as needed for pain. 120 tablet 0    PONATinib (ICLUSIG) 30 mg tablet Take 1 tablet (30 mg total) by mouth daily. Swallow tablets whole. Do not crush, break, cut or chew tablets. 30 tablet 5    pregabalin (LYRICA) 100 MG capsule Take 1 capsule (100 mg total) by mouth Three (3) times a day. 90 capsule 3    spironolactone (ALDACTONE) 25 MG tablet TAKE 1 TABLET (25 MG TOTAL) BY MOUTH DAILY. 90 tablet 3    sulfamethoxazole-trimethoprim (BACTRIM DS) 800-160 mg per tablet Take 1 tablet (160 mg of trimethoprim total) by mouth two (2) times a day. TAKE 1 TABLET BY MOUTH 2 TIMES A DAY ON SATURDAY, SUNDAY. FOR PROPHYLAXIS 48 tablet 3    syringe with needle, safety (BD ECLIPSE LUER-LOK) 3 mL 22 gauge x 1 1/2 Syrg To be used with  TC injections 50 each 0    testosterone cypionate (DEPOTESTOTERONE CYPIONATE) 200 mg/mL injection Inject 0.5 mL (100 mg total) into the muscle once a week. Single Use Vials 4 mL 5    tiotropium-olodaterol (STIOLTO RESPIMAT) 2.5-2.5 mcg/actuation Mist Inhale 2 puffs daily. 4 g 3    tiotropium-olodaterol (STIOLTO RESPIMAT) 2.5-2.5 mcg/actuation Mist Inhale 2 puffs daily. 4 g 0    tiotropium-olodaterol (STIOLTO RESPIMAT) 2.5-2.5 mcg/actuation Mist Inhale 2 puffs daily. 4 g 0    valACYclovir (VALTREX) 500 MG tablet Take 1 tablet (500 mg total) by mouth daily. 90 tablet 3    albuterol HFA 90 mcg/actuation inhaler Inhale 2 puffs every six (6) hours as needed for wheezing. 18 g 0     No current facility-administered medications for this visit.       Vital Signs:  Vitals:    06/14/23 1314   BP: 167/108   Pulse: 93   Resp: 18   Temp: 36.1 ??C (97 ??F)   SpO2: 95%       Physical Exam:  GENERAL: Well-appearing white man accompanied by his wife. Looks fatigued.   HEART: Normal color, not excessive pallor.  CHEST/LUNG: Normal work of breathing. No dyspnea with conversation.  ABDOMEN: No obvious distention.    EXTREMITIES: No edema, cyanosis or clubbing.   SKIN: No noticable rash or petechiae.   NEURO EXAM: Grossly intact.       MDM:  1 chronic life threatening illness  Drug therapy requiring intense monitoring for toxicity

## 2023-06-14 ENCOUNTER — Ambulatory Visit: Admit: 2023-06-14 | Discharge: 2023-06-15 | Payer: MEDICAID | Attending: Hematology | Primary: Hematology

## 2023-06-14 ENCOUNTER — Other Ambulatory Visit: Admit: 2023-06-14 | Discharge: 2023-06-15 | Payer: MEDICAID

## 2023-06-14 DIAGNOSIS — R7989 Other specified abnormal findings of blood chemistry: Principal | ICD-10-CM

## 2023-06-14 DIAGNOSIS — C9101 Acute lymphoblastic leukemia, in remission: Principal | ICD-10-CM

## 2023-06-14 LAB — TESTOSTERONE: TESTOSTERONE TOTAL: 330 ng/dL (ref 197–670)

## 2023-06-14 LAB — COMPREHENSIVE METABOLIC PANEL
ALBUMIN: 3.8 g/dL (ref 3.4–5.0)
ALKALINE PHOSPHATASE: 98 U/L (ref 46–116)
ALT (SGPT): 30 U/L (ref 10–49)
ANION GAP: 11 mmol/L (ref 5–14)
AST (SGOT): 22 U/L (ref <=34)
BILIRUBIN TOTAL: 0.4 mg/dL (ref 0.3–1.2)
BLOOD UREA NITROGEN: 11 mg/dL (ref 9–23)
BUN / CREAT RATIO: 6
CALCIUM: 9.4 mg/dL (ref 8.7–10.4)
CHLORIDE: 104 mmol/L (ref 98–107)
CO2: 26 mmol/L (ref 20.0–31.0)
CREATININE: 1.86 mg/dL — ABNORMAL HIGH (ref 0.73–1.18)
EGFR CKD-EPI (2021) MALE: 45 mL/min/{1.73_m2} — ABNORMAL LOW (ref >=60)
GLUCOSE RANDOM: 119 mg/dL (ref 70–179)
POTASSIUM: 4.1 mmol/L (ref 3.5–5.1)
PROTEIN TOTAL: 6 g/dL (ref 5.7–8.2)
SODIUM: 141 mmol/L (ref 135–145)

## 2023-06-14 LAB — CBC W/ AUTO DIFF
BASOPHILS ABSOLUTE COUNT: 0.1 10*9/L (ref 0.0–0.1)
BASOPHILS RELATIVE PERCENT: 0.9 %
EOSINOPHILS ABSOLUTE COUNT: 0.6 10*9/L — ABNORMAL HIGH (ref 0.0–0.5)
EOSINOPHILS RELATIVE PERCENT: 5.9 %
HEMATOCRIT: 54.6 % — ABNORMAL HIGH (ref 39.0–48.0)
HEMOGLOBIN: 18.6 g/dL — ABNORMAL HIGH (ref 12.9–16.5)
LYMPHOCYTES ABSOLUTE COUNT: 2.3 10*9/L (ref 1.1–3.6)
LYMPHOCYTES RELATIVE PERCENT: 22.5 %
MEAN CORPUSCULAR HEMOGLOBIN CONC: 34.1 g/dL (ref 32.0–36.0)
MEAN CORPUSCULAR HEMOGLOBIN: 28.6 pg (ref 25.9–32.4)
MEAN CORPUSCULAR VOLUME: 83.7 fL (ref 77.6–95.7)
MEAN PLATELET VOLUME: 10.8 fL — ABNORMAL HIGH (ref 6.8–10.7)
MONOCYTES ABSOLUTE COUNT: 0.6 10*9/L (ref 0.3–0.8)
MONOCYTES RELATIVE PERCENT: 6 %
NEUTROPHILS ABSOLUTE COUNT: 6.6 10*9/L (ref 1.8–7.8)
NEUTROPHILS RELATIVE PERCENT: 64.7 %
PLATELET COUNT: 178 10*9/L (ref 150–450)
RED BLOOD CELL COUNT: 6.52 10*12/L — ABNORMAL HIGH (ref 4.26–5.60)
RED CELL DISTRIBUTION WIDTH: 15.6 % — ABNORMAL HIGH (ref 12.2–15.2)
WBC ADJUSTED: 10.2 10*9/L (ref 3.6–11.2)

## 2023-06-14 LAB — PSA: PROSTATE SPECIFIC ANTIGEN: 0.36 ng/mL (ref 0.00–4.00)

## 2023-06-16 DIAGNOSIS — C9101 Acute lymphoblastic leukemia, in remission: Principal | ICD-10-CM

## 2023-06-19 DIAGNOSIS — C9101 Acute lymphoblastic leukemia, in remission: Principal | ICD-10-CM

## 2023-06-19 MED ORDER — FENTANYL 12 MCG/HR TRANSDERMAL PATCH
MEDICATED_PATCH | TRANSDERMAL | 0 refills | 30.00 days | Status: CP
Start: 2023-06-19 — End: 2023-07-19

## 2023-06-19 MED ORDER — OXYCODONE 10 MG TABLET
ORAL_TABLET | ORAL | 0 refills | 20.00 days | Status: CP | PRN
Start: 2023-06-19 — End: ?

## 2023-06-22 ENCOUNTER — Encounter
Admit: 2023-06-22 | Discharge: 2023-06-23 | Payer: MEDICAID | Attending: Student in an Organized Health Care Education/Training Program | Primary: Student in an Organized Health Care Education/Training Program

## 2023-06-22 DIAGNOSIS — Z515 Encounter for palliative care: Principal | ICD-10-CM

## 2023-06-22 DIAGNOSIS — G893 Neoplasm related pain (acute) (chronic): Principal | ICD-10-CM

## 2023-06-22 DIAGNOSIS — R0600 Dyspnea, unspecified: Principal | ICD-10-CM

## 2023-06-22 DIAGNOSIS — C9101 Acute lymphoblastic leukemia, in remission: Principal | ICD-10-CM

## 2023-06-22 DIAGNOSIS — F419 Anxiety disorder, unspecified: Principal | ICD-10-CM

## 2023-06-22 DIAGNOSIS — R5383 Other fatigue: Principal | ICD-10-CM

## 2023-06-22 DIAGNOSIS — F32A Depression, unspecified depression type: Principal | ICD-10-CM

## 2023-06-22 MED ORDER — FENTANYL 25 MCG/HR TRANSDERMAL PATCH
MEDICATED_PATCH | TRANSDERMAL | 0 refills | 30.00 days | Status: CP
Start: 2023-06-22 — End: 2023-07-22

## 2023-06-22 NOTE — Unmapped (Signed)
 OUTPATIENT ONCOLOGY PALLIATIVE CARE     Principal Diagnosis: Adam Keith is a 43 y.o. 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 He was recently admitted to [facility] with lowe extremity pain and edema. Has had episodic fluctuations in mood.  Has decided not to pursue bone marrow transplant at this time. Has felt chronically ill for the last year.  Unclear unfortunately exactly what is contributing to this, like the combination of treatment side effects and refractory depression.  Unfortunately has started to feel a little bit better with testosterone supplementation. Appreciate oncology and psychiatry assistance in management.     Assessment/Plan:      # Depressed Mood: Chronic depression, continues to feel down due to constitutional fatigue, pain, inability to go outside and do things that bring meaning to him.  Continues to follow with Adam Keith and feels that he is making some progress with her.  No safety concerns but continues to feel poorly overall.  -Medications per Adam Keith        #Pain: Chronic, persistent pain, primarily in joints. Associated with/exacerbated by sitting too long, though he also says that activity seems to make it worst. Previously trialed butrans patch but did not find this effective. MRI of the spine showed narrowing in the lumbar region likely causing sciatica-like pain.  Trying to limit opioids, but given level of pain and distress impacting quality of life will continue to optimize opioid regimen given relief reported with oxycodone use. Notes that he has been taking consistently 6 times a day so will increase fentanyl patch in hope of improved basal pain relief in addition to topical voltran gel, as needed tylenol, and use of a heated pad. Adam Keith will also restart chemotherapy this evening as he has felt over all worse with discontinued use.    - Increase fentanyl patch to 25 mcg/h every 72 hours - refill sent 3/20  - Continue with oxycodone 10 mg q4 prn for breakthrough pain, using 6 times a day consistently   - Encouraged to usel add voltran gel as well as heating pads understanding that opioids are not the best modality for pain management       #Persistent productive cough, ENT infections, low grade fevers  -Not addressed in detail today    #Chronic pain: At baseline, see above  - Pain management as above     #Advance care planning: Continues to prioritize quality of life.  After discussion with infectious disease as well as oncology has made the decision not to pursue another bone marrow transplant at this time.  He states that despite how poorly he has been feeling he will continue with chemotherapy at this time.      #Controlled substances risk management:  Patient does not have a signed pain medication agreement with our team.  NCCSRS database was reviewed today and it was appropriate.  Urine drug screen was not performed at this visit. Findings: not applicable.  Patient has received information about safe storage and administration of medications.  Patient has not received a prescription for narcan; is not applicable.      F/u: 6-8 weeks     ----------------------------------------  Referring Provider: Dr. Berline Keith  Oncology Team: Malignant hematology team  PCP: Adam [facility]     HPI: 43 year old man with a diagnosis of B-ALL, diagnosed in October 2021, now in complete remission. Recently admitted to the [facility] ICU 03/06/21-03/20/21 for AHRF d/t MRSA/klebsiella PNA, requiring intubation. This is  following admissions in August, May, April, March, February, and January 2022.     Interval History:     Adam Keith has recently trailed a week without chemotherapy and states that he has felt  over all worse. He endorses an interval increase in body aches and intermittent migraines and has thus decided to restart his chemotherapy this evening. We discussed his joint pain and he continues to work with his RA . Given cancer treatment with chemotherapy, he is not able to start further immunosuppressive therapy for his RA. We have discussed the challenges of joint pain and the need to treat with a multimodality approach. Adam Keith shares that he is trying to get out more and complete his wood working projects and that he feels supported by his wife. Due to his changing ability to appreciate the taste of food his wife graciously cooks a variety of meals and he appreciates the effort. We discussed trailing to help smoothies for days he does not feel inclined to eat much more. Adam Keith also shares that his mood has been stable as he continues to work with Adam [facility].     Palliative Performance Scale: 80% - Ambulation: Full / Normal Activity, some evidence of disease / Self-Care:Full / Intake: Normal / Level of Conscious: Full     Coping/Support Issues: Having a lot of difficulty coping with his current setback. Well supported by his wife. Also has a psychiatrist and therapist.     Goals of Care: Treatment and cancer-directed therapy     Social History:  Name of primary support: Wife Adam Keith, 4 daughters and 1 son. 25, 22, 3 20 year olds.  Occupation: Was in the [employer]	[place]  Hobbies: Woodworking  Current residence / distance from [facility]: [place]     Has a 74 year old son.      Advance Care Planning: Did not discuss this visit. Continuing with chemotherapy at this time.         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    Onion Anaphylaxis    Vancomycin Analogues Other (See Comments)       Hearing loss with Lasix     Other reaction(s): Other (See Comments)     Hearing loss     lasix     Hearing loss      lasix Hearing loss with Lasix Other reaction(s): Other (See Comments) Hearing loss lasix    Bismuth Subsalicylate Nausea And Vomiting    Privigen [Immun Glob G(Igg)-Pro-Iga 0-50] Other (See Comments)       03/10/2021 VIG infusion stopped as patient developed rigors, tachycardia and HTN during infusion despite pre-medication; 03/12/21 completed IVIG with premedications and slower rate of infusion    Furosemide Other (See Comments)       With Vancomycin caused hearing loss     Other reaction(s): Other (See Comments)     With Vancomycin caused hearing loss      With Vancomycin caused hearing loss Other reaction(s): Other (See Comments) With Vancomycin caused  hearing loss    Gammagard           Family History:  Cancer-related family history includes Cancer in his maternal grandfather and mother. There is no history of Melanoma.  He indicated that the status of his mother is unknown. He indicated that the status of his father is unknown. He indicated that the status of his sister is unknown. He indicated that the status of his brother is unknown. He indicated that the status of his maternal grandmother is unknown. He indicated that the status of his maternal grandfather is unknown. He indicated that the status of his paternal grandmother is unknown. He indicated that the status of his paternal grandfather is unknown. He indicated that the status of his maternal aunt is unknown. He indicated that the status of his maternal uncle is unknown. He indicated that the status of his paternal aunt is unknown. He indicated that the status of his paternal uncle is unknown. He indicated that the status of his neg hx is unknown.        REVIEW OF SYSTEMS:  A comprehensive review of 10 systems was negative except for pertinent positives noted in HPI.           Lab Results   Component Value Date     CREATININE 1.93 (H) 02/22/2022            Lab Results   Component Value Date     ALKPHOS 92 02/22/2022     BILITOT 0.4 02/22/2022     BILIDIR 0.20 02/07/2022     PROT 6.6 02/22/2022     ALBUMIN 4.1 02/22/2022     ALT 96 (H) 02/22/2022     AST 45 (H) 02/22/2022      Physical exam:  Gen: NAD  Pulm: nl WOB  Skin: Dry and red  Psych: Guarded and dysphoric      The patient reports they are physically located in [place] and is currently: at home. I conducted a audio/video visit. I spent  0s on the video call with the patient. I spent an additional 15 minutes on pre- and post-visit activities on the date of service .       Fredirick Lathe, Keith  [facility] Outpatient Oncology Palliative Care

## 2023-06-23 ENCOUNTER — Encounter: Admit: 2023-06-23 | Discharge: 2023-06-24 | Payer: MEDICAID | Attending: Adult Health | Primary: Adult Health

## 2023-06-23 DIAGNOSIS — E291 Testicular hypofunction: Principal | ICD-10-CM

## 2023-06-23 DIAGNOSIS — R5383 Other fatigue: Principal | ICD-10-CM

## 2023-06-23 DIAGNOSIS — D751 Secondary polycythemia: Principal | ICD-10-CM

## 2023-06-23 DIAGNOSIS — N529 Male erectile dysfunction, unspecified: Principal | ICD-10-CM

## 2023-06-23 NOTE — Unmapped (Addendum)
 The patient reports they are physically located in [place] and is currently: at home. I conducted a audio/video visit. I spent  30m 12s on the video call with the patient. I spent an additional 10 minutes on pre- and post-visit activities on the date of service .     Assessment:   Diagnosis ICD-10-CM Associated Orders   1. Fatigue, unspecified type  R53.83 Ambulatory referral to Urology     Testosterone     Estradiol (Estrogen) Level     Prolactin     Luteinizing hormone     PSA (Prostate Specific Antigen)     Hematocrit     Testosterone     Estradiol (Estrogen) Level     Prolactin     Luteinizing hormone     PSA (Prostate Specific Antigen)     Hematocrit      2. Erectile dysfunction, unspecified erectile dysfunction type  N52.9 Ambulatory referral to Urology      3. Hypogonadism in male   4. Erythrocytosis  E29.1 Sedimentation rate, manual      This is a very pleasant 43 yo male with history of ALL likely contributing to ED and hypogonadism. Hematocrit last 54.6, potentially an outlier. Will have him hold hist testosterone until he rechecks his hematocrit. Currently undergoing chemotherapy.     Plan:  - tc 100 mg weekly (on hold until he rechecks his hematocrit)   - viagra 100 mg once he consistently taking testosterone medication as this may naturally improve his erections.   We discussed he may take this 30 minutes to four hours prior to intercourse to help with erections.  This medication is best on an empty stomach.  We also discussed he may not takes nitrates for chest pain with this medication and that he should go to the ED if he has an erection lasting more than four hours.   - hematocrit/ testosterone next available  - if stable rtc 6 months  months with repeat labs   (briefly discussed ICI, but declined for now)     Counseling:    We discussed that options include observation with implementation of dietary/exercise strategies versus testosterone replacement (with gel, injections, pellets, etc) and alternative therapies such as Clomid and/or Arimidex to increase natural production. We also discussed that losing weight is a means of naturally improving testosterone levels. Studies suggest dietary modifications including drinking less alcohol, and eating a well-balanced diet with lots of vegetables can also help.  Also stress management and improved sleep habits were discussed.  I suggested a trial of 6 months of diet/exercise strategies prior to medical therapy.    We reviewed risks of TRT including but not limited to acne, fluid retention, BPH or urinary symptoms, breast enlargement, worsening of sleep apnea, infertility, changes in blood count requiring phlebotomy, changes in cholesterol requiring additional medical therapy, blood clots including deep venous thrombosis or thromboembolic events, and prostate cancer progression.  We also discussed recent literature suggesting an increase in cardiovascular events (including heart attack/stroke) and mortality, but we also reviewed that there is conflicting literature in this regard.  He understands these risks and wishes to proceed.  We also reviewed the AUA's Position Statement on testosterone therapy with him and the new FDA warning, and these statements were provided in writing.    We discussed that he will need to be followed at regular intervals for physical exam (including digital rectal exam), reassessment of symptoms, and lab work including PSA, hormone levels, and complete blood counts.  After being fully advised of these risks and expressing understanding, he would like to pursue therapy.  He was given a handout delineating the importance of regular follow-up and risks of testosterone replacement therapy, including but not limited to those noted above.           Referring Provider:  NP Sampson Goon    PCP:   Dr. Sampson Goon     Reason for Visit:  ED/ Hypogonadism     HPI:   Adam Keith  is a pleasant 43 y.o. year-old male who is being seen today in consultation at the request of NP Dellis Anes for ED/ hypogonadism.  History of Ph + B-ALL.  After review of records, reported fatigue to oncology. Had T drawn and was found to be in the 200s.     He was prescribed testosterone 9/26, but states he hasn't been able to get the medication due to it needing prior authorization.     Using HIMS, states he's tried cialis 5 mg and then most recently viagra 100 mg. Married monogamous. States last time he was able to have an erection was about two years ago. States he has desire, but certainly not able to even achieve a nocturnal erection.     Has extreme fatigue; barely feeling like he can get up out of bed.      Latest Reference Range & Units 12/08/22 11:04   Testosterone (Mayo) 240 - 950 ng/dL 782 (L)   (L): Data is abnormally low      Interval 06/23/23:     He notes that he injects Tuesdays/ Wednesdays. States he got his last labs about day 7 after his injection cycle, so likely why levels were low. Still undergoing chemo. States his energy has improved somewhat.     States he did try viagra 100 mg x one time, and it wasn't effective, but not able to penetrate without the viagra.     Today, he reports that he is doing well. Today, he denies fever, chills, chest pain, shortness of breath, cough, wheezing, nausea, vomiting, abdominal pain, flank pain, diarrhea, constipation, dysuria, hematuria, rashes, open wounds, edema, and weakness or numbness in the upper or lower extremities.      Latest Reference Range & Units 06/14/23 12:40   Testosterone 197 - 670 ng/dL 956      Latest Reference Range & Units 06/14/23 12:40   Prostate Specific Antigen 0.00 - 4.00 ng/mL 0.36        Latest Reference Range & Units 06/14/23 12:40   HCT 39.0 - 48.0 % 54.6 (H)   (H): Data is abnormally high      Component      Latest Ref Rng 01/09/2023 01/24/2023 06/14/2023   Testosterone, Free      4.46 - 17.1 ng/dL  2.13 (L)     Testosterone (Mayo)      240 - 950 ng/dL  086 (L)     LH      mIU/mL 3.6  4.1 Prostate Specific Antigen      0.00 - 4.00 ng/mL 0.32   0.36    HCT      39.0 - 48.0 % 48.0      FSH      mIU/mL  18.8        Legend:  (L) Low  Past medical history:  Past Medical History:   Diagnosis Date    ALL (acute lymphoblastic leukemia) (CMS-HCC)     COVID-19 05/31/2020    MRSA bacteremia 01/28/2020  Pneumonia of right lung due to methicillin resistant Staphylococcus aureus (MRSA) (CMS-HCC) 03/09/2021    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    Septic shock due to Staphylococcus aureus (CMS-HCC) 01/28/2020    Tear of medial meniscus of knee 10/25/2016    Testicular pain, left 01/21/2022       Past surgical history:  Past Surgical History:   Procedure Laterality Date    BONE MARROW BIOPSY & ASPIRATION  01/21/2020         CHG [place], CHEST,REAL TIME  11/20/2020    Procedure: ULTRASOUND, CHEST, REAL TIME WITH IMAGE DOCUMENTATION;  Surgeon: Jerelyn Charles, MD;  Location: BRONCH PROCEDURE LAB [facility];  Service: Pulmonary    IR INSERT PORT AGE GREATER THAN 5 YRS  03/10/2020    IR INSERT PORT AGE GREATER THAN 5 YRS 03/10/2020 [name], MD IMG VIR H&V [facility]    PR BRONCHOSCOPY,DIAGNOSTIC W LAVAGE Bilateral 07/20/2020    Procedure: BRONCHOSCOPY, RIGID OR FLEXIBLE, INCLUDE FLUOROSCOPIC GUIDANCE WHEN PERFORMED; W/BRONCHIAL ALVEOLAR LAVAGE WITH MODERATE SEDATION;  Surgeon: Dellis Filbert, MD;  Location: BRON[facility] PROCEDURE LAB [facility];  Service: Pulmonary       Social history:  Social History     Social History Narrative    Not on file   Licensed conveyancer     Family history:  family history includes Cancer in his maternal grandfather and mother; No Known Problems in his brother, father, maternal aunt, maternal grandmother, maternal uncle, paternal aunt, paternal grandfather, paternal grandmother, paternal uncle, and sister.    MEDICATIONS:   Current Outpatient Medications   Medication Sig Dispense Refill    albuterol HFA 90 mcg/actuation inhaler Inhale 2 puffs every six (6) hours as needed for wheezing. 18 g 0 amLODIPine (NORVASC) 10 MG tablet Take 1 tablet (10 mg total) by mouth daily. 30 tablet 0    apixaban (ELIQUIS) 5 mg Tab Take 1 tablet (5 mg total) by mouth two (2) times a day. 60 tablet 6    arm brace (WRIST BRACE) Misc 1 Piece by Miscellaneous route as needed. left ulnar wrist brace 1 each 0    carvediloL (COREG) 12.5 MG tablet Take 1 tablet (12.5 mg total) by mouth Two (2) times a day. 180 tablet 1    cholecalciferol, vitamin D3-250 mcg, 10,000 unit,, 250 mcg (10,000 unit) capsule Take 1 capsule (250 mcg total) by mouth daily. 120 capsule 3    docusate sodium (COLACE) 100 MG capsule Take 1 capsule (100 mg total) by mouth daily.      doxycycline (VIBRA-TABS) 100 MG tablet Take 1 tablet (100 mg total) by mouth two (2) times a day. 180 tablet 3    fentaNYL (DURAGESIC) 25 mcg/hr patch Place 1 patch on the skin every third day. 10 patch 0    hydroCHLOROthiazide 12.5 MG tablet Take 1 tablet (12.5 mg total) by mouth daily. 30 tablet 11    naloxone (NARCAN) 4 mg nasal spray One spray in either nostril once for known/suspected opioid overdose. May repeat every 2-3 minutes in alternating nostril til EMS arrives 2 each 0    nortriptyline (PAMELOR) 50 MG capsule TAKE 2 CAPSULES BY MOUTH EVERY DAY AT NIGHT 180 capsule 4    omeprazole (PRILOSEC) 40 MG capsule Take 1 capsule (40 mg total) by mouth every morning.      ondansetron (ZOFRAN-ODT) 4 MG disintegrating tablet Take 1 tablet (4 mg total) by mouth every eight (8) hours as needed for nausea.  oxyCODONE (ROXICODONE) 10 mg immediate release tablet Take 1 tablet (10 mg total) by mouth every four (4) hours as needed for pain. 120 tablet 0    PONATinib (ICLUSIG) 30 mg tablet Take 1 tablet (30 mg total) by mouth daily. Swallow tablets whole. Do not crush, break, cut or chew tablets. 30 tablet 5    pregabalin (LYRICA) 100 MG capsule Take 1 capsule (100 mg total) by mouth Three (3) times a day. 90 capsule 3    spironolactone (ALDACTONE) 25 MG tablet TAKE 1 TABLET (25 MG TOTAL) BY MOUTH DAILY. 90 tablet 3    sulfamethoxazole-trimethoprim (BACTRIM DS) 800-160 mg per tablet Take 1 tablet (160 mg of trimethoprim total) by mouth two (2) times a day. TAKE 1 TABLET BY MOUTH 2 TIMES A DAY ON SATURDAY, SUNDAY. FOR PROPHYLAXIS 48 tablet 3    syringe with needle, safety (BD ECLIPSE LUER-LOK) 3 mL 22 gauge x 1 1/2 Syrg To be used with TC injections 50 each 0    testosterone cypionate (DEPOTESTOTERONE CYPIONATE) 200 mg/mL injection Inject 0.5 mL (100 mg total) into the muscle once a week. Single Use Vials 4 mL 5    tiotropium-olodaterol (STIOLTO RESPIMAT) 2.5-2.5 mcg/actuation Mist Inhale 2 puffs daily. 4 g 3    tiotropium-olodaterol (STIOLTO RESPIMAT) 2.5-2.5 mcg/actuation Mist Inhale 2 puffs daily. 4 g 0    tiotropium-olodaterol (STIOLTO RESPIMAT) 2.5-2.5 mcg/actuation Mist Inhale 2 puffs daily. 4 g 0    valACYclovir (VALTREX) 500 MG tablet Take 1 tablet (500 mg total) by mouth daily. 90 tablet 3     No current facility-administered medications for this visit.       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    Onion Anaphylaxis    Vancomycin Analogues Other (See Comments)     Hearing loss with Lasix    Other reaction(s): Other (See Comments)    Hearing loss    lasix    Hearing loss      lasix      Hearing loss with Lasix Other reaction(s): Other (See Comments) Hearing loss lasix    Bismuth Subsalicylate Nausea And Vomiting    Privigen [Immun Glob G(Igg)-Pro-Iga 0-50] Other (See Comments)     12 /10/2020 VIG infusion stopped as patient developed rigors, tachycardia and HTN during infusion despite pre-medication; 03/12/21 completed IVIG with premedications and slower rate of infusion Furosemide Other (See Comments)     With Vancomycin caused hearing loss    Other reaction(s): Other (See Comments)    With Vancomycin caused hearing loss      With Vancomycin caused hearing loss Other reaction(s): Other (See Comments) With Vancomycin caused hearing loss    Gammagard        Review of Systems:  Positive for fatigue.  Otherwise, a 10 system ROS questionnaire completed by the patient and reviewed by me was normal.    Physical Exam  There were no vitals filed for this visit.    GENERAL: The patient is a pleasant, male in no acute distress.

## 2023-06-26 DIAGNOSIS — C9101 Acute lymphoblastic leukemia, in remission: Principal | ICD-10-CM

## 2023-06-27 ENCOUNTER — Ambulatory Visit: Admit: 2023-06-27 | Discharge: 2023-06-28

## 2023-06-27 DIAGNOSIS — D751 Secondary polycythemia: Principal | ICD-10-CM

## 2023-06-27 LAB — CBC W/ AUTO DIFF
BASOPHILS ABSOLUTE COUNT: 0.1 10*9/L (ref 0.0–0.1)
BASOPHILS RELATIVE PERCENT: 0.9 %
EOSINOPHILS ABSOLUTE COUNT: 0.3 10*9/L (ref 0.0–0.5)
EOSINOPHILS RELATIVE PERCENT: 2.4 %
HEMATOCRIT: 57.7 % — ABNORMAL HIGH (ref 39.0–48.0)
HEMOGLOBIN: 19 g/dL — ABNORMAL HIGH (ref 12.9–16.5)
LYMPHOCYTES ABSOLUTE COUNT: 2.2 10*9/L (ref 1.1–3.6)
LYMPHOCYTES RELATIVE PERCENT: 20.4 %
MEAN CORPUSCULAR HEMOGLOBIN CONC: 33 g/dL (ref 32.0–36.0)
MEAN CORPUSCULAR HEMOGLOBIN: 28.1 pg (ref 25.9–32.4)
MEAN CORPUSCULAR VOLUME: 85.2 fL (ref 77.6–95.7)
MEAN PLATELET VOLUME: 8.7 fL (ref 6.8–10.7)
MONOCYTES ABSOLUTE COUNT: 0.6 10*9/L (ref 0.3–0.8)
MONOCYTES RELATIVE PERCENT: 5.6 %
NEUTROPHILS ABSOLUTE COUNT: 7.5 10*9/L (ref 1.8–7.8)
NEUTROPHILS RELATIVE PERCENT: 70.7 %
NUCLEATED RED BLOOD CELLS: 1 /100{WBCs} (ref <=4)
PLATELET COUNT: 170 10*9/L (ref 150–450)
RED BLOOD CELL COUNT: 6.77 10*12/L — ABNORMAL HIGH (ref 4.26–5.60)
RED CELL DISTRIBUTION WIDTH: 16.1 % — ABNORMAL HIGH (ref 12.2–15.2)
WBC ADJUSTED: 10.7 10*9/L (ref 3.6–11.2)

## 2023-06-27 LAB — COMPREHENSIVE METABOLIC PANEL
ALBUMIN: 4.3 g/dL (ref 3.4–5.0)
ALKALINE PHOSPHATASE: 95 U/L (ref 46–116)
ALT (SGPT): 33 U/L (ref 10–49)
ANION GAP: 9 mmol/L (ref 5–14)
AST (SGOT): 22 U/L (ref <=34)
BILIRUBIN TOTAL: 0.7 mg/dL (ref 0.3–1.2)
BLOOD UREA NITROGEN: 13 mg/dL (ref 9–23)
BUN / CREAT RATIO: 7
CALCIUM: 10.5 mg/dL — ABNORMAL HIGH (ref 8.7–10.4)
CHLORIDE: 105 mmol/L (ref 98–107)
CO2: 29.6 mmol/L (ref 20.0–31.0)
CREATININE: 1.79 mg/dL — ABNORMAL HIGH (ref 0.73–1.18)
EGFR CKD-EPI (2021) MALE: 48 mL/min/{1.73_m2} — ABNORMAL LOW (ref >=60)
GLUCOSE RANDOM: 108 mg/dL (ref 70–179)
POTASSIUM: 4.7 mmol/L (ref 3.4–4.8)
PROTEIN TOTAL: 7 g/dL (ref 5.7–8.2)
SODIUM: 144 mmol/L (ref 135–145)

## 2023-06-27 LAB — TESTOSTERONE: TESTOSTERONE TOTAL: 220 ng/dL (ref 197–670)

## 2023-06-29 DIAGNOSIS — C9101 Acute lymphoblastic leukemia, in remission: Principal | ICD-10-CM

## 2023-06-29 NOTE — Unmapped (Signed)
 [facility] Refill Coordination Note    Adam Keith, DOB: Nov 30, 1980  Phone: [phone] (work)      All above HIPAA information was verified with patient.         06/28/2023     4:06 PM   Specialty Rx Medication Refill Questionnaire   Which Medications would you like refilled and shipped? Ponatnib (iglusive)   Please list all current allergies: See list in mychart   Have you missed any doses in the last 30 days? Yes   If Yes, how many doses have you missed ? 6-7   Have you had any changes to your medication(s) since your last refill? No   How many days remaining of each medication do you have at home? 1 week or so more   Have you experienced any side effects in the last 30 days? No   Please enter the full address (street address, city, state, zip code) where you would like your medication(s) to be delivered to. [address]  , [place] [address]   Please specify on which day you would like your medication(s) to arrive. Note: if you need your medication(s) within 3 days, please call the pharmacy to schedule your order at [phone]  07/05/2023   Has your insurance changed since your last refill? No   Would you like a pharmacist to call you to discuss your medication(s)? No   Do you require a signature for your package? (Note: if we are billing Medicare Part B or your order contains a controlled substance, we will require a signature) No   I have been provided my out of pocket cost for my medication and approve the pharmacy to charge the amount to my credit card on file. Yes   Additional Comments: Went off chemo for 7 days per Dr's request.  Started back and probably won't be taking anymore breaks         Completed refill call assessment today to schedule patient's medication shipment from the [facility] [phone]).  All relevant notes have been reviewed.       Confirmed patient received a Conservation officer, historic buildings and a Surveyor, mining with first shipment. The patient will receive a drug information handout for each medication shipped and additional FDA Medication Guides as required.         REFERRAL TO PHARMACIST     Referral to the pharmacist: Not needed      [facility]     Shipping address confirmed in Epic.     Delivery Scheduled: Yes, Expected medication delivery date: 07/05/23.     Medication will be delivered via UPS to the prescription address in Epic [place].    Adam Keith [name]   [facility] Specialty Technician

## 2023-07-03 DIAGNOSIS — C9100 Acute lymphoblastic leukemia not having achieved remission: Principal | ICD-10-CM

## 2023-07-03 DIAGNOSIS — C9101 Acute lymphoblastic leukemia, in remission: Principal | ICD-10-CM

## 2023-07-04 DIAGNOSIS — D751 Secondary polycythemia: Principal | ICD-10-CM

## 2023-07-04 MED FILL — ICLUSIG 30 MG TABLET: ORAL | 30 days supply | Qty: 30 | Fill #4

## 2023-07-10 NOTE — Unmapped (Signed)
 Lm with the number for benign hematology [phone]) for pt to schedule therapeutic phlebotomy appts. Checked with that clinic and they have the plan in Epic.

## 2023-07-11 ENCOUNTER — Encounter: Admit: 2023-07-11 | Discharge: 2023-07-12

## 2023-07-11 DIAGNOSIS — F331 Major depressive disorder, recurrent, moderate: Principal | ICD-10-CM

## 2023-07-11 DIAGNOSIS — F4310 Post-traumatic stress disorder, unspecified: Principal | ICD-10-CM

## 2023-07-11 DIAGNOSIS — F419 Anxiety disorder, unspecified: Principal | ICD-10-CM

## 2023-07-11 NOTE — Unmapped (Signed)
 [facility]  Psychiatry--Comprehensive Cancer Support Program   Established Patient E&M Service - Outpatient       Assessment:    Adam Keith presents for follow-up evaluation. He states that he continues to struggle with profound fatigue and not feeling well most days. Depression is variable depending on the day. No difficulty weaning off of the mirtazapine. Sleeping well most nights, but finds that he struggles to fall asleep on occasion. No medication changes needed--continuing his nortriptyline 100 mg at bedtime. Will plan to meet on 09/26/23.    Identifying Information:  Adam Keith is a 43 y.o. male with a history of PH+ B cell ALL, recurrent major depressive disorder, PTSD, anxiety, and insomnia. He has had a long struggle with intermittent depressive episodes with one suicide attempt at age 50.      Risk Assessment:  An assessment of suicide and violence risk factors was performed as part of this evaluation and is not significantly increased from the last visit.   While future psychiatric events cannot be accurately predicted, the patient does not currently require acute inpatient psychiatric care and does not currently meet [facility]  involuntary commitment criteria.      Plan:    Problem 1: Major Depressive Disorder  Status of problem: chronic with mild to moderate exacerbation  Interventions:   Continue nortriptyline 100 mg at bedtime.      Encouraged psychotherapy with local therapist     Problem 2: Anxiety/PTSD  Status of problem: chronic with mild exacerbation  Interventions:   See MDD above     Problem 3: Insomnia  Status of problem: improving  Interventions:   See nortriptyline plan above      Psychotherapy provided:  No billable psychotherapy service provided.    Patient has been given this writer's contact information as well as the [facility] Psychiat[facility] urgent line number. The patient has been instructed to call [phone] for emergencies.      Subjective:    Chief complaint:  Follow-up psychiatric evaluation for depression, anxiety, and sleep    Interval History:   He recently weaned off mirtazapine, which has resulted in some difficulty falling asleep but less difficulty waking up and overall less daytime fatigue. He describes his depression as variable, with some good days and some bad days, often influenced by the weather and his physical health. He reports that he feels worse on cold, gloomy days when he is alone and not feeling well. He also notes that his fatigue is worsening again as he is coming off testosterone injections due to concerns about blood viscosity and increased risk of stroke and heart attack. He reports that he did not initially notice an energy boost with the testosterone, but upon discontinuation, he noticed a significant increase in fatigue.     Prior Psychiatric Medication Trials: bupropion (could not tolerate), duloxetine, mirtazapine (given during recent hospitalization), lorazepam, clonazepam, methylphenidate (jittery)     Psychiatric Review of Systems  Depressive Symptoms: intermittent depressed mood and profound fatigue  Manic Symptoms: N/A  Psychosis Symptoms: N/A  Anxiety Symptoms: excessive anxiety at times, ruminations, fatigue  Panic Symptoms: denies  PTSD Symptoms: denies  Sleep disturbance: difficulty falling asleep at times  Exercise: Unable to engage in a daily exercise routine.      Objective:      Mental Status Exam:  Appearance:    Appears stated age, Well nourished, and Clean/Neat   Motor:   No abnormal movements   Speech/Language:    Normal rate,  volume, tone, fluency   Mood:    OK   Affect:   Calm, Cooperative, and Euthymic   Thought process and Associations:   Logical, linear, clear, coherent, goal directed   Abnormal/psychotic thought content:     Denies SI, HI, self harm, delusions, obsessions, paranoid ideation, or ideas of reference   Perceptual disturbances:     Denies auditory and visual hallucinations, behavior not concerning for response to internal stimuli     Other:   Insight and judgment intact     I personally spent 26 minutes face-to-face and non-face-to-face in the care of this patient, which includes all pre, intra, and post visit time on the date of service.    Visit was completed by video (or phone) and the appropriate disclaimer has been included below.    The patient reports they are physically located in [place]  and is currently: at home. I conducted a audio/video visit. I spent 11 minutes on the video call with the patient. I spent an additional 15 minutes on pre- and post-visit activities on the date of service .        Shana Younge B Mindy Behnken, PMHNP  07/11/2023

## 2023-07-13 DIAGNOSIS — C9101 Acute lymphoblastic leukemia, in remission: Principal | ICD-10-CM

## 2023-07-13 MED ORDER — OXYCODONE 10 MG TABLET
ORAL_TABLET | ORAL | 0 refills | 20.00 days | Status: CP | PRN
Start: 2023-07-13 — End: ?

## 2023-07-21 ENCOUNTER — Ambulatory Visit: Admit: 2023-07-21 | Discharge: 2023-07-22 | Payer: MEDICAID

## 2023-07-21 LAB — CBC W/ AUTO DIFF
BASOPHILS ABSOLUTE COUNT: 0.1 10*9/L (ref 0.0–0.1)
BASOPHILS RELATIVE PERCENT: 0.7 %
EOSINOPHILS ABSOLUTE COUNT: 0.4 10*9/L (ref 0.0–0.5)
EOSINOPHILS RELATIVE PERCENT: 3.1 %
HEMATOCRIT: 58 % — ABNORMAL HIGH (ref 39.0–48.0)
HEMOGLOBIN: 20 g/dL — ABNORMAL HIGH (ref 12.9–16.5)
LYMPHOCYTES ABSOLUTE COUNT: 2.5 10*9/L (ref 1.1–3.6)
LYMPHOCYTES RELATIVE PERCENT: 21.9 %
MEAN CORPUSCULAR HEMOGLOBIN CONC: 34.5 g/dL (ref 32.0–36.0)
MEAN CORPUSCULAR HEMOGLOBIN: 28.6 pg (ref 25.9–32.4)
MEAN CORPUSCULAR VOLUME: 83 fL (ref 77.6–95.7)
MEAN PLATELET VOLUME: 9.7 fL (ref 6.8–10.7)
MONOCYTES ABSOLUTE COUNT: 0.6 10*9/L (ref 0.3–0.8)
MONOCYTES RELATIVE PERCENT: 5.4 %
NEUTROPHILS ABSOLUTE COUNT: 8 10*9/L — ABNORMAL HIGH (ref 1.8–7.8)
NEUTROPHILS RELATIVE PERCENT: 68.9 %
NUCLEATED RED BLOOD CELLS: 1 /100{WBCs} (ref <=4)
PLATELET COUNT: 141 10*9/L — ABNORMAL LOW (ref 150–450)
RED BLOOD CELL COUNT: 6.99 10*12/L — ABNORMAL HIGH (ref 4.26–5.60)
RED CELL DISTRIBUTION WIDTH: 15.5 % — ABNORMAL HIGH (ref 12.2–15.2)
WBC ADJUSTED: 11.5 10*9/L — ABNORMAL HIGH (ref 3.6–11.2)

## 2023-07-21 LAB — COMPREHENSIVE METABOLIC PANEL
ALBUMIN: 4.4 g/dL (ref 3.4–5.0)
ALKALINE PHOSPHATASE: 104 U/L (ref 46–116)
ALT (SGPT): 41 U/L (ref 10–49)
ANION GAP: 11 mmol/L (ref 5–14)
AST (SGOT): 27 U/L (ref <=34)
BILIRUBIN TOTAL: 0.5 mg/dL (ref 0.3–1.2)
BLOOD UREA NITROGEN: 17 mg/dL (ref 9–23)
BUN / CREAT RATIO: 9
CALCIUM: 10.4 mg/dL (ref 8.7–10.4)
CHLORIDE: 103 mmol/L (ref 98–107)
CO2: 30.3 mmol/L (ref 20.0–31.0)
CREATININE: 1.83 mg/dL — ABNORMAL HIGH (ref 0.73–1.18)
EGFR CKD-EPI (2021) MALE: 46 mL/min/1.73m2 — ABNORMAL LOW (ref >=60)
GLUCOSE RANDOM: 106 mg/dL (ref 70–179)
POTASSIUM: 4.4 mmol/L (ref 3.4–4.8)
PROTEIN TOTAL: 6.9 g/dL (ref 5.7–8.2)
SODIUM: 144 mmol/L (ref 135–145)

## 2023-07-23 NOTE — Unmapped (Signed)
 This pharmacist was notified by a technician that this patient has reported that they've missed 7 doses of their Iclusig .. I have reviewed the patient's medical record and have determined that no further pharmacist action is needed. Provider is aware of break in therapy.      Approximate time spent: 0-5 minutes    Wilbern Hancock, [facility], Clinical Specialty Pharmacist  [facility]

## 2023-07-26 ENCOUNTER — Encounter: Admit: 2023-07-26 | Discharge: 2023-07-27 | Payer: MEDICAID | Attending: Hematology | Primary: Hematology

## 2023-07-26 DIAGNOSIS — C9101 Acute lymphoblastic leukemia, in remission: Principal | ICD-10-CM

## 2023-07-26 NOTE — Unmapped (Unsigned)
 [facility]    Patient Name: Adam Keith  Patient Age: 43 y.o.  Encounter Date: 07/26/2023    Primary Care Provider:  Jerelyn Money, MD    Referring Physician:  Tanya Fantasia, MD  [address]  [place],  [place] [address]    Cancer Diagnosis: Ph+ ALL; Initial Dx 01/2020  Cancer status: MRD-neg remission  Treatment Regimen: Third Line, ponatinib monotherapy   Treatment Goal: Curative  Comorbidities: COPD, OSA, DVT/PE  Transplant: Discussed, but did not want to proceed    Assessment:  A 43 y.o. 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 The course was complicated by septic shock, candida krusei fungemia, MRSA bacteremia with septic emboli c/b acute renal failure and respiratory distress requiring dialysis and intubation.  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  He subsequently had difficulty tolerating single agent dasatinib (as a bridge to planned ponatinib-blinatumomab--had fluid retention and pleural effusion).  He then completed 4 cycles of ponatinib-blinatumomab, initiated for treatment of MRD in the setting of poor tolerance of standard therapy.      He is currently on ponatinib 30 mg monotherapy (on monotherapy since 01/2021).  When we decreased his ponatinib to 15 mg, he had a rise in transcripts. Most recent BCR-ABL is + at 0.002. Now, BCR/ABL is negative on 30 mg.   Assessment & Plan  Ph+ B-ALL : MRD-neg by PCR, hx of CNS ds   Ph+ ALL managed with ponatinib. Previous trial off chemotherapy for 1 week caused withdrawal symptoms. Plan to reassess chemotherapy withdrawal impact on BCR-ABL levels.  - Conduct two-week trial off ponatinib to assess withdrawal symptoms and BCR-ABL levels.  - Coordinate with palliative care for pain management during chemotherapy withdrawal.  - Schedule blood work at [facility] for late May.    Elevated hemoglobin  Elevated hemoglobin likely due to testosterone therapy. Off testosterone for a month, reports decreased energy. Awaiting urology's approval for therapeutic phlebotomy.  - Coordinate with urology for therapeutic phlebotomy.  - Resume testosterone therapy post-phlebotomy, pending urology's approval.    Hypogonadism  Hypogonadism managed with testosterone therapy. Reports decreased energy after discontinuation. Awaiting urology's plan for therapeutic phlebotomy before resuming therapy.  - Resume testosterone therapy post-therapeutic phlebotomy, pending urology's approval.    Chronic back pain  Chronic back pain managed with fentanyl patches and oxycodone. Current regimen provides temporary relief. Exploring non-pharmacological options.  - Continue current pain management regimen with fentanyl patches and oxycodone.  - Explore non-pharmacological options such as different footwear and foam padding for concrete surfaces.    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   - No plan for further IVIG given several reactions   - Rec Flu and Covid vaccines     DRESS: followed by Dr. 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 Now s/p dialysis  - stable Cr appears to have developed CKD due to incompletely healed acute injury.  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 CT chest 02/06/22 with residual subacute thrombus.   - Indefinite anticoagulation with apixaban (in addition to asa given ponatinib)  Vitamin D Deficiency: was low 07/2022.   - started supplementation 07/2022.   - He has not noticed any change in symptoms with it. Will recheck level in 1 month.     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 Armond Bertin and Dr. Lorenz Romano and Dr. Teofilo Fellers following patient.    - long term doxycycline with goal of decolonization  **Hx Candidemia during induction - now s/p Cresemba  - followed by Dr. Armond Bertin  **Hx Covid-19 x 4 documented   - possible long-Covid symptoms    Symptom management: bowel regimen as listed in medication list, analgesics as listed in medication list or symptoms reviewed and due to acceptable control, no changes made  **Peripheral neuropathy, sensory and motor  - Grade 1-2 monitor  **Chronic Back Pain, complicated by hospital stay and procedures  - Followed by Dr. Lavin, Palliative Care  **Depression/anxiety: following with Vona Grumet, [facility]  - management per psychiatry     Venous access: I recommend the following change for venous access : PICC line removal      Supportive Care Recommendations:  We recommend based on the patient???s underlying diagnosis and treatment history the following supportive care:    1. Antimicrobial prophylaxis:   - Primary management per ICID  - On daily doxy for recurrent MRSA pna    - Viral - valacyclovir 500mg  daily  - Bacterial - levofloxacin 500 mg if ANC <0.5  - Fungal - previously on treatment isavuconazole - off now  - PJP - On Bactrim DS BID Sat/Sun  - recommend Annual flu shot - declines thus far  - recommend updated Covid vaccine - declines    2. Blood product support:  I recommend the following intervals for laboratory monitoring to determine transfusion needs and monitor for hematologic effects of therapy and underlying disease: no routine monitoring outside of clinic follow-up    Leukoreduced blood products are required.  Irradiated blood products are preferred, but in case of urgent transfusion needs non-irradiated blood products may be used:     -  RBC transfusion threshold: transfuse 2 units for Hgb < 8 g/dL.  -  Platelet transfusion threshold: transfuse 1 unit of platelets for platelet count < 10, or for bleeding or need for invasive procedure.    3. Hematopoietic growth factor support: none    Care coordination: The next [facility] Follow up requested:  - 6w video visit to reassess - will plan for nilotinib if symptoms improve off ponatinib     Wayland Haggis, MD  Leukemia Program  Division of Hematology  [facility]     Nurse Navigator (non-clinical trial patients): Madelon Scheuermann, RN       Tel. [phone]       Fax. [phone]  Toll-free appointments: [phone]  Scheduling assistance: [phone]  After hours/weekends: [phone] (ask for adult hematology/oncology on-call)      History of Present Illness:  We had the pleasure of seeing Delawrence Fridman in the Leukemia Clinic at the [facility]  on 07/26/2023.  He is a 43 y.o. male with  Ph+ B-ALL .  He is currently on ponatinib, a third-generation TKI, and has been in deep remission since July,  with BCR-ABL levels consistently low.     Interval hx:   After discontinuing chemotherapy for five days, he felt significantly worse and almost required hospitalization. He experienced nausea for a week, which he attributes to potential withdrawal effects from stopping the medication. He is concerned about his testosterone levels, noting that he has been off testosterone therapy for a month and feels a lack of energy. He is awaiting a therapeutic phlebotomy to manage his elevated hemoglobin levels, which were noted to be higher at 20. He has not yet heard back from his urology team regarding the procedure. He reports ongoing pain management issues, currently using fentanyl patches and oxycodone. The fentanyl patches provide relief for about a day, but the pain returns thereafter, affecting his joints, knees, and hips. He changes the patches every three days to manage his symptoms. 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 He is considering trying different shoes and foam padding to alleviate discomfort from standing on concrete. He has twelve orders to fulfill and receives help from his children to manage his workload.       His oncologic history is as follows:    Hematology/Oncology History Overview Note   Referring/Local Oncologist: None    Diagnosis:Ph+ ALL    Genetics:    Karyotype/FISH:Abnormal Karyotype: 46,XY,t(9;22)(q34;q11.2)[1]/45,XY,der(7;9)(q10;q10)t(9;22)(q34;q11.2),der(22)t(9;22)[11]/46,sdl,+der(22)t(9;22)[5]/46,XY[3]     Abnormal FISH: A BCR/ABL1 interphase FISH assay shows an abnormal signal pattern in 97% of the 100 cells scored. 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  The findings support a diagnosis of leukemia and have implications for targeted therapy and for monitoring residual disease.      Molecular Genetics:  BCR-ABL1 p210 transcripts were detected at a level of 46.479 IS% ratio in bone marrow.  BCR-ABL1 p190 transcripts were detected at a level of 4 in 100,000 cells in bone marrow.    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   01/23/2020 Initial Diagnosis    Acute lymphoblastic leukemia (ALL) not having achieved remission (CMS-HCC)     01/24/2020 - 04/02/2020 Chemotherapy    IP/OP LEUKEMIA GRAAPH-2005 + RITUXIMAB < 60 YO  rituximab hypercvad (odd and even course)     02/24/2020 Remission    CR with PCR MRD+; marrow showing 70% blasts with <1% blasts, negative flow MRD, BCR-ABL p210 PCR 0.197%; CNS negative for blasts     03/09/2020 Progression    CSF - rare blast ID'ed - IT chemo given    03/13/20 - IT chemo, CSF negative     04/16/2020 Biopsy    BM Bx with 40-50% cellularity, <1% blasts, MRD flow cytometry negative, BCR-ABL p210 transcripts 0.036%     05/28/2020 - 05/28/2020 Chemotherapy    OP AML - CNS THERAPY (INTRATHECAL CYTARABINE, INTRATHECAL METHOTREXATE, OR INTRATHECAL TRIPLE)  Select one of the following: cytarabine IT 100 mg with hydrocortisone 50 mg, cytarabine IT 40 mg with methotrexate 15 mg with hydrocortisone 50 mg, OR methotrexate IT 12 mg with hydrocortisone 50 mg     06/19/2020 -  Chemotherapy    IP/OP LEUKEMIA BLINATUMOMAB 7-DAY INFUSION (MINIMAL RESIDUAL DISEASE; WT >= 22 KG) (HOME INFUSION)  Cycles 1*-4: Blinatumomab 28 mcg/day Days 1-28 of 6-week cycle.  *Given in the inpatient setting on Days 1-3 on Cycle 1 and Days 1-2 on Cycle 2, while other treatment days are given in the outpatient setting.    Cycle 1 MRD Dosing     07/18/2020 Adverse Reaction    Hospitalization: fevers. Pneumonia     07/23/2020 Biopsy    Diagnosis  Bone marrow, right iliac, aspiration and biopsy  -   Normocellular bone marrow (50%) with trilineage hematopoiesis and 1% blasts by manual aspirate differential  -   Flow cytometry MRD analysis reveals no definitive immunophenotypic evidence of residual B lymphoblastic leukemia   - BCR-ABL p210 0.006%         08/10/2020 -  Chemotherapy    Cycle 2 blinatumomab-ponatinib  Ponatinib 30mg     1 IT per cycle     09/21/2020 Adverse Reaction    Covid-19, symptomatic but not requiring hospitalization.     10/06/2020 -  Chemotherapy    Cycle 3 blinatumomab-ponatinib  Ponatinib 30mg        11/01/2020 Adverse Reaction    Hospitalization: MRSA Pneumonia requiring intubation    Blinatumomab stopped.  Ponatinib held until he stabilized.       12/14/2020 -  Chemotherapy    Cycle 4 blinatumomab 28 mcg/day days 1-28, ponatinib 30 mg per day IT triple therapy day 29     01/13/2021 - Chemotherapy    Ponatinib 30mg  daily  - due to due low level BCR-ABL     03/06/2021 Adverse Reaction    Hospitalization - ICU on ventilator - influenza and MRSA/klebsiella PNA     06/13/2022 -  Chemotherapy    OP LEUKEMIA VINCRISTINE  vinCRIStine 2 mg IV on day 1     ALL (acute lymphoblastic leukemia)   06/11/2020 -  Chemotherapy    IP/OP LEUKEMIA BLINATUMOMAB 7-DAY INFUSION (MINIMAL RESIDUAL DISEASE; WT >= 22 KG) (HOME INFUSION)  Cycles 1*-4: Blinatumomab 28 mcg/day Days 1-28 of 6-week cycle.  *Given in the inpatient setting on Days 1-3 on Cycle 1 and Days 1-2 on Cycle 2, while other treatment days are given in the outpatient setting.     09/21/2020 Initial Diagnosis    ALL (acute lymphoblastic leukemia) (CMS-HCC)     06/13/2022 -  Chemotherapy    OP LEUKEMIA VINCRISTINE  vinCRIStine 2 mg IV on day 1     Acute lymphoblastic leukemia in remission   06/11/2020 -  Chemotherapy    IP/OP LEUKEMIA BLINATUMOMAB 7-DAY INFUSION (MINIMAL RESIDUAL DISEASE; WT >= 22 KG) (HOME INFUSION)  Cycles 1*-4: Blinatumomab 28 mcg/day Days 1-28 of 6-week cycle.  *Given in the inpatient setting on Days 1-3 on Cycle 1 and Days 1-2 on Cycle 2, while other treatment days are given in the outpatient setting.     11/09/2020 Initial Diagnosis    Acute lymphoblastic leukemia in remission (CMS-HCC)     06/13/2022 -  Chemotherapy    OP LEUKEMIA VINCRISTINE  vinCRIStine 2 mg IV on day 1           Past Medical, Surgical and Family History were reviewed and pertinent updates were made in the Electronic Medical Record      ECOG Performance Status: 1    Medications:      Current Outpatient Medications   Medication Sig Dispense Refill    albuterol HFA 90 mcg/actuation inhaler Inhale 2 puffs every six (6) hours as needed for wheezing. 18 g 0    amLODIPine (NORVASC) 10 MG  tablet Take 1 tablet (10 mg total) by mouth daily. 30 tablet 0    apixaban (ELIQUIS) 5 mg Tab Take 1 tablet (5 mg total) by mouth two (2) times a day. 60 tablet 6    arm brace (WRIST BRACE) Misc 1 Piece by Miscellaneous route as needed. left ulnar wrist brace 1 each 0    carvediloL (COREG) 12.5 MG tablet Take 1 tablet (12.5 mg total) by mouth Two (2) times a day. 180 tablet 1    cholecalciferol, vitamin D3-250 mcg, 10,000 unit,, 250 mcg (10,000 unit) capsule Take 1 capsule (250 mcg total) by mouth daily. 120 capsule 3    docusate sodium (COLACE) 100 MG capsule Take 1 capsule (100 mg total) by mouth daily.      doxycycline (VIBRA-TABS) 100 MG tablet Take 1 tablet (100 mg total) by mouth two (2) times a day. 180 tablet 3    hydroCHLOROthiazide 12.5 MG tablet Take 1 tablet (12.5 mg total) by mouth daily. 30 tablet 11    naloxone (NARCAN) 4 mg nasal spray One spray in either nostril once for known/suspected opioid overdose. May repeat every 2-3 minutes in alternating nostril til EMS arrives 2 each 0    nortriptyline (PAMELOR) 50 MG capsule TAKE 2 CAPSULES BY MOUTH EVERY DAY AT NIGHT 180 capsule 4    omeprazole (PRILOSEC) 40 MG capsule Take 1 capsule (40 mg total) by mouth every morning.      ondansetron (ZOFRAN-ODT) 4 MG disintegrating tablet Take 1 tablet (4 mg total) by mouth every eight (8) hours as needed for nausea.      oxyCODONE (ROXICODONE) 10 mg immediate release tablet Take 1 tablet (10 mg total) by mouth every four (4) hours as needed for pain. 120 tablet 0    PONATinib (ICLUSIG) 30 mg tablet Take 1 tablet (30 mg total) by mouth daily. Swallow tablets whole. Do not crush, break, cut or chew tablets. 30 tablet 5    pregabalin (LYRICA) 100 MG capsule Take 1 capsule (100 mg total) by mouth Three (3) times a day. 90 capsule 3    spironolactone (ALDACTONE) 25 MG tablet TAKE 1 TABLET (25 MG TOTAL) BY MOUTH DAILY. 90 tablet 3    sulfamethoxazole-trimethoprim (BACTRIM DS) 800-160 mg per tablet Take 1 tablet (160 mg of trimethoprim total) by mouth two (2) times a day. TAKE 1 TABLET BY MOUTH 2 TIMES A DAY ON SATURDAY, SUNDAY. FOR PROPHYLAXIS 48 tablet 3    syringe with needle, safety (BD ECLIPSE LUER-LOK) 3 mL 22 gauge x 1 1/2 Syrg To be used with TC injections 50 each 0    testosterone cypionate (DEPOTESTOTERONE CYPIONATE) 200 mg/mL injection Inject 0.5 mL (100 mg total) into the muscle once a week. Single Use Vials 4 mL 5    tiotropium-olodaterol (STIOLTO RESPIMAT) 2.5-2.5 mcg/actuation Mist Inhale 2 puffs daily. 4 g 3    tiotropium-olodaterol (STIOLTO RESPIMAT) 2.5-2.5 mcg/actuation Mist Inhale 2 puffs daily. 4 g 0    tiotropium-olodaterol (STIOLTO RESPIMAT) 2.5-2.5 mcg/actuation Mist Inhale 2 puffs daily. 4 g 0    valACYclovir (VALTREX) 500 MG tablet Take 1 tablet (500 mg total) by mouth daily. 90 tablet 3     No current facility-administered medications for this visit.       Vital Signs:  There were no vitals filed for this visit.    GENERAL: Well-appearing white man. NAD. Driving.    HEENT: Pupils equal, round. EOMI.   HEART: Normal color, not excessive pallor. Not ashen.   CHEST/LUNG: Normal  work of breathing. No dyspnea with conversation.   EXTREMITIES: Not viewed.   NEURO EXAM: Grossly intact.         MDM:  1 chronic life threatening illness  Drug therapy requiring intense monitoring for toxicity       The patient reports they are physically located in [place]  and is currently: not at home. I conducted a audio/video visit. I spent  14m 58s on the video call with the patient. I spent an additional 10 minutes on pre- and post-visit activities on the date of service . tablet 3    sulfamethoxazole-trimethoprim (BACTRIM DS) 800-160 mg per tablet Take 1 tablet (160 mg of trimethoprim total) by mouth two (2) times a day. TAKE 1 TABLET BY MOUTH 2 TIMES A DAY ON SATURDAY, SUNDAY. FOR PROPHYLAXIS 48 tablet 3    syringe with needle, safety (BD ECLIPSE LUER-LOK) 3 mL 22 gauge x 1 1/2 Syrg To be used with TC injections 50 each 0    testosterone cypionate (DEPOTESTOTERONE CYPIONATE) 200 mg/mL injection Inject 0.5 mL (100 mg total) into the muscle once a week. Single Use Vials 4 mL 5    tiotropium-olodaterol (STIOLTO RESPIMAT) 2.5-2.5 mcg/actuation Mist Inhale 2 puffs daily. 4 g 3    tiotropium-olodaterol (STIOLTO RESPIMAT) 2.5-2.5 mcg/actuation Mist Inhale 2 puffs daily. 4 g 0    tiotropium-olodaterol (STIOLTO RESPIMAT) 2.5-2.5 mcg/actuation Mist Inhale 2 puffs daily. 4 g 0    valACYclovir (VALTREX) 500 MG tablet Take 1 tablet (500 mg total) by mouth daily. 90 tablet 3     No current facility-administered medications for this visit.       Vital Signs:  There were no vitals filed for this visit.      Physical Exam:  GENERAL: Well-appearing white man accompanied by his wife. Looks fatigued.   HEART: Normal color, not excessive pallor.  CHEST/LUNG: Normal work of breathing. No dyspnea with conversation.  ABDOMEN: No obvious distention.    EXTREMITIES: No edema, cyanosis or clubbing.   SKIN: No noticable rash or petechiae.   NEURO EXAM: Grossly intact.       MDM:  1 chronic life threatening illness  Drug therapy requiring intense monitoring for toxicity

## 2023-07-26 NOTE — Unmapped (Signed)
 [facility] Refill Coordination Note    Adam Keith, DOB: December 21, 1980  Phone: [phone] (work)      All above HIPAA information was verified with patient.         07/26/2023    10:01 AM   Specialty Rx Medication Refill Questionnaire   Which Medications would you like refilled and shipped? Inclig (Ponatnib)   Please list all current allergies: See MyChart   Have you missed any doses in the last 30 days? No   Have you had any changes to your medication(s) since your last refill? No   How many days remaining of each medication do you have at home? 1 week or so   Have you experienced any side effects in the last 30 days? No   Please enter the full address (street address, city, state, zip code) where you would like your medication(s) to be delivered to. [address] [place], [place], [place] [address]   Please specify on which day you would like your medication(s) to arrive. Note: if you need your medication(s) within 3 days, please call the pharmacy to schedule your order at [phone]  07/31/2023   Has your insurance changed since your last refill? No   Would you like a pharmacist to call you to discuss your medication(s)? No   Do you require a signature for your package? (Note: if we are billing Medicare Part B or your order contains a controlled substance, we will require a signature) No   I have been provided my out of pocket cost for my medication and approve the pharmacy to charge the amount to my credit card on file. Yes         Completed refill call assessment today to schedule patient's medication shipment from the [facility] [phone]).  All relevant notes have been reviewed.       Confirmed patient received a Conservation officer, historic buildings and a Surveyor, mining with first shipment. The patient will receive a drug information handout for each medication shipped and additional FDA Medication Guides as required.         REFERRAL TO PHARMACIST Referral to the pharmacist: Not needed      [facility]     Shipping address confirmed in Epic.     Delivery Scheduled: Yes, Expected medication delivery date: 08/03/23. PT approved delivery for 08/03/23    Medication will be delivered via UPS to the prescription address in Epic WAM.    Adam Keith   [facility] Specialty Technician

## 2023-08-01 DIAGNOSIS — C9101 Acute lymphoblastic leukemia, in remission: Principal | ICD-10-CM

## 2023-08-02 DIAGNOSIS — C9101 Acute lymphoblastic leukemia, in remission: Principal | ICD-10-CM

## 2023-08-02 MED FILL — ICLUSIG 30 MG TABLET: ORAL | 30 days supply | Qty: 30 | Fill #5

## 2023-08-06 NOTE — Unmapped (Addendum)
 IMMUNOCOMPROMISED HOST INFECTIOUS DISEASE PROGRESS NOTE      The patient reports they are physically located in   and is currently: at home. I conducted a audio/video visit. I spent  31m on the video call with the patient. I spent an additional 23 minutes on pre- and post-visit activities on the date of service .       Assessment/Plan:     [name] is a 43 y.o. 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 krusei fungemia  - Prior chemotherapy: [id number] induction with dasatinib (vincristine, dexamethasone and dasatinib); C1D1 01/24/2020; difficulty tolerating single-agent dasatinib  - 01/11/21: Normocellular bone marrow (30% overall) with trilineage hematopoiesis and less than 1% blasts by manual aspirate differential; Flow cytometry MRD analysis reveals no definitive immunophenotypic evidence of residual B lymphoblastic leukemia; BCR-ABL p210 transcripts were detected at a level of 0.002 IS % ratio in bone marrow.  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 s/p mica+azole until 05/13/20  # Possible post-COVID-19 associated fungal infection 07/17/20 s/p isavuconazole until 12/03/20  # Recurrent nasal congestion/ L maxillary sinusitis in setting of L posterior and R anterior septal deviation, 10/11/21  # Influenza A, 05/04/21  # Otitis media with left mastoid effusion, 06/2022   # COVID-19 infection 05/28/2020, 09/21/2020, 10/18/2021, 05/31/22 (s/p molnupirovir)     Active infections:    # Recurrent, severe MRSA infections with intubation; on doxycycline prophylaxis since 03/26/21  - 01/27/20 MRSA bacteremia + PNA + TV IE;   - 11/01/2020 MRSA RLL PNA c/b bacteremia/right empyema;   - 11/18/2020 RML MRSA pneumonia;   - 03/05/21 MRSA pneumonia after influenza infection requiring ICU admission/intubation  - 07/01/22 CT chest: Unchanged scarring and architectural distortion in the right upper lung. Upper lung-predominant emphysema. Trace tracheal debris. No pleural effusion or pneumothorax.    #Acute bilateral LE pain below waist ~ 3 days 08/08/23  - no weakness or swelling, sensation intact, no change in bowel or bladder function    #Recent tick exposure late April, with no fever and resolving tick bite  reaction 08/08/23  - photo from wife review 5/6 without evidence of EM or site infection    Antimicrobial Intolerance/allergy  Cefepime - DRESS/DiHS  Ceftaroline - DRESS/DiHS  Dapsone - possible agranulocytosis, per chart anaphylaxis  Vancomycin - probable ototoxicity (in combination with furosemide)  Isavuconazole - elevated LFTs in the setting of TKI  Linezolid - peripheral neuropathy; previously tolerated tedizolid  Aztreonam+linezolid - patient reported ear pain and worsening hearing (in the setting COVID-19)      RECOMMENDATIONS    Unclear etiology of acute b/l limb pain He is on apixaban though had breakthrough DVT/PE in past while on DOAC. Denies any numbness or weakness with no worsening back pain. He recently discontinued ponatinib and there is concern of withdrawal with approximately 25-30% of people have some withdrawal symptoms from stopping ponatinib or similar medications (TKI's) presenting as myalgias. Doubt the tick exposure is clinical relevant here with no weakness or rash or fevers and should otherwise be covered by doxycycline. Given recent testosterone use and underlying malignancy, would rule out acute arterial thrombosis. Heme team made aware     Diagnosis  Obtain labs including CBC wdiff, CMP, CK, Mg  Check UA   CTA aorta b/l LE to ensure no arterial clots, though he remains on apixaban    Management  On doxycycline 100mg  bid indefinitely - discussed on potentially ok to observe off antibiotics. He wishes to continue for now overweighing benefits over risk.    Prophylaxis required for hematological malignancy   Continue valacyclovir 500mg  daily  Continue tmp-smx 2 DS bid on S/S  Avoid prolong sun exposure while on PO doxycycline with appropriate sun skin care measures  Vaccination - did not discuss today. Will benefit PCV21 with COVID booster this summer, Flu shot in september    Follow up in 2-3 months, earlier via myChart if needed    08/09/23 labs review  Stable blood and urine abnormalities  Hgb 19, Cr 1.7  UA protein 30, urobili 2.0, tr blood  08/24/23  CTA a/p reviewed no source of LE pain but 80% left renal artery stenosis noted.  I called to go over the results with him and told him that [facility] with oncology would refer him to VIR and maybe vascular.   His bruised legs are better. He continues to deal with the chronic pain/hip pain and going to be seen at the [facility] for this.          Recommendations were communicated via shared medical record.    I personally spent 36 minutes face-to-face and non-face-to-face in the care of this patient, which includes all pre, intra, and post visit time on the date of service. Time was spent on chart review, obtaining the history, charting, and discussion on prevention.       Analicia Skibinski J Fishel Wamble, MD  [facility] Division of Infectious Diseases    Attending telehealth attestation  This was a telehealth service performed by a fellow, and I was available to the fellow via real-time audio and video connection (I was present on the video call for the full 13 minutes). Immediately after or during the visit, I reviewed with the fellow the medical history and the fellow's assessment and plan. I discussed with the fellow the patient's diagnosis and concur with the treatment plan as documented in the fellow note.   Fletcher Humble, MD  Division of Infectious Diseases       Subjective     External record(s): seen by heme 4/26-plans for  Conduct two-week  trial off ponatinib to assess withdraw .    Independent historian(s): no independent historian required.       Interval History:   Doing well. Off testosterone since a month  C/o b/l LE pain started since 3 days intact sensation. No bowel or urinary incontinence.  No worsening lower back pain. No fevers or chills. No LE weakness  Having intermittent temporal headaches, no vision issues. No GI symptoms.   Later he mentioned of having exposure to tick , no rash or fevers.     Medications:    Current Outpatient Medications:     albuterol HFA 90 mcg/actuation inhaler, Inhale 2 puffs every six (6) hours as needed for wheezing., Disp: 18 g, Rfl: 0    amLODIPine (NORVASC) 10 MG tablet, Take 1 tablet (10 mg total) by mouth daily., Disp: 30 tablet, Rfl: 0    apixaban (ELIQUIS) 5 mg Tab, Take 1 tablet (5 mg total) by mouth two (2) times a day., Disp: 60 tablet, Rfl: 6    arm brace (WRIST BRACE) Misc, 1 Piece by Miscellaneous route as needed. left ulnar wrist brace, Disp: 1 each, Rfl: 0    carvediloL (COREG) 12.5 MG tablet, Take 1 tablet (12.5 mg total) by mouth Two (2) times a day., Disp: 180 tablet, Rfl: 1    cholecalciferol, vitamin D3-250 mcg, 10,000 unit,, 250 mcg (10,000 unit) capsule, Take 1 capsule (250 mcg total) by mouth daily., Disp: 120 capsule, Rfl: 3    docusate sodium (COLACE) 100 MG capsule, Take 1 capsule (100 mg total) by mouth daily., Disp: , Rfl:     doxycycline (VIBRA-TABS) 100 MG tablet, Take 1 tablet (100 mg total) by mouth two (2) times a day., Disp: 180 tablet, Rfl: 3    hydroCHLOROthiazide 12.5 MG tablet, Take 1 tablet (12.5 mg total) by mouth daily., Disp: 30 tablet, Rfl: 11    naloxone (NARCAN) 4 mg nasal spray, One spray in either nostril once for known/suspected opioid overdose. May repeat every 2-3 minutes in alternating nostril til EMS arrives, Disp: 2 each, Rfl: 0    nortriptyline (PAMELOR) 50 MG capsule, TAKE 2 CAPSULES BY MOUTH EVERY DAY AT NIGHT, Disp: 180 capsule, Rfl: 4    omeprazole (PRILOSEC) 40 MG capsule, Take 1 capsule (40 mg total) by mouth every morning., Disp: , Rfl:     ondansetron (ZOFRAN-ODT) 4 MG disintegrating tablet, Take 1 tablet (4 mg total) by mouth every eight (8) hours as needed for nausea., Disp: , Rfl:     oxyCODONE (ROXICODONE) 10 mg immediate release tablet, Take 1 tablet (10 mg total) by mouth every four (4) hours as needed for pain., Disp: 120 tablet, Rfl: 0    PONATinib (ICLUSIG) 30 mg tablet, Take 1 tablet (30 mg total) by mouth daily. Swallow tablets whole. Do not crush, break, cut or chew tablets., Disp: 30 tablet, Rfl: 5    pregabalin (LYRICA) 100 MG capsule, Take 1 capsule (100 mg total) by mouth Three (3) times a day., Disp: 90 capsule, Rfl: 3    spironolactone (ALDACTONE) 25 MG tablet, TAKE 1 TABLET (25 MG TOTAL) BY MOUTH DAILY., Disp: 90 tablet, Rfl: 3    sulfamethoxazole-trimethoprim (BACTRIM DS) 800-160 mg per tablet, Take 1 tablet (160 mg of trimethoprim total) by mouth two (2) times a day. TAKE 1 TABLET BY MOUTH 2 TIMES A DAY ON SATURDAY, SUNDAY. FOR PROPHYLAXIS, Disp: 48 tablet, Rfl: 3    syringe with needle, safety (BD ECLIPSE LUER-LOK) 3 mL 22 gauge x 1 1/2  Syrg, To be used with TC injections, Disp: 50 each, Rfl: 0    testosterone cypionate (DEPOTESTOTERONE CYPIONATE) 200 mg/mL injection, Inject 0.5 mL (100 mg total) into the muscle once a week. Single Use Vials, Disp: 4 mL, Rfl: 5    tiotropium-olodaterol (STIOLTO RESPIMAT) 2.5-2.5 mcg/actuation Mist, Inhale 2 puffs daily., Disp: 4 g, Rfl: 3    tiotropium-olodaterol (STIOLTO RESPIMAT) 2.5-2.5 mcg/actuation Mist, Inhale 2 puffs daily., Disp: 4 g, Rfl: 0    tiotropium-olodaterol (STIOLTO RESPIMAT) 2.5-2.5 mcg/actuation Mist, Inhale 2 puffs daily., Disp: 4 g, Rfl: 0    valACYclovir (VALTREX) 500 MG tablet, Take 1 tablet (500 mg total) by mouth daily., Disp: 90 tablet, Rfl: 3    Objective     Vital signs:  There were no vitals taken for this visit.      Physical Exam:   Const []  vital signs above    [x]  NAD, non-toxic appearance []  Chronically ill-appearing, non-distressed        Eyes []  Lids normal bilaterally, conjunctiva anicteric and noninjected OU     [] PERRL  [] EOMI        ENMT []  Normal appearance of external nose and ears, no nasal discharge        []  MMM, no lesions on lips or gums [x]  No thrush, leukoplakia, oral lesions  []  Dentition good []  Edentulous []  Dental caries present  []  Hearing normal  []  TMs with good light reflexes bilaterally         Neck []  Neck of normal appearance and trachea midline        []  No thyromegaly, nodules, or tenderness   []  Full neck ROM        Lymph []  No LAD in neck     []  No LAD in supraclavicular area     []  No LAD in axillae   []  No LAD in epitrochlear chains     []  No LAD in inguinal areas        CV []  RRR            []  No peripheral edema []  Pedal pulses intact   []  No abnormal heart sounds appreciated   []  Extremities WWP         Resp [x]  Normal WOB at rest    []  No breathlessness with speaking, no coughing  []  CTA anteriorly    []  CTA posteriorly          GI []  Normal inspection, NTND   []  NABS     []  No umbilical hernia on exam       []  No hepatosplenomegaly     []  Inspection of perineal and perianal areas normal        GU []  Normal external genitalia     [] No urinary catheter present in urethra   []  No CVA tenderness    []  No tenderness over renal allograft        MSK []  No clubbing or cyanosis of hands       []  No vertebral point tenderness  []  No focal tenderness or abnormalities on palpation of joints in RUE, LUE, RLE, or LLE        Skin []  No rashes, lesions, or ulcers of visualized skin     []  Skin warm and dry to palpation         Neuro []  Face expression symmetric  []  Sensation to light touch grossly intact throughout    [x]  Moves extremities equally    []   No tremor noted        []  CNs II-XII grossly intact     []  DTRs normal and symmetric throughout []  Gait unremarkable        Psych [x]  Appropriate affect       []  Fluent speech         []  Attentive, good eye contact  []  Oriented to person, place, time          []  Judgment and insight are appropriate           Data for Medical Decision Making     (07/02/23) EKG QTcF    I discussed mgm't w/qualified health care professional(s) involved in case: none.    I reviewed CBC results (wbc 11.5 ALC 2.5 07/21/23), chemistry results (Cr 1.83, LFTs nl), and EKG 07/01/23 .    I independently visualized/interpreted not done.       Results in Past 30 Days  Result Component Current Result Ref Range Previous Result Ref Range   Absolute Eosinophils 0.4 (07/21/2023) 0.0 - 0.5 10*9/L Not in Time Range    Absolute Lymphocytes 2.5 (07/21/2023) 1.1 - 3.6 10*9/L Not in Time Range    Absolute Neutrophils 8.0 (H) (07/21/2023) 1.8 - 7.8 10*9/L Not in Time Range    Alkaline Phosphatase 104 (07/21/2023) 46 - 116 U/L Not in Time Range    ALT 41 (07/21/2023) 10 - 49 U/L Not in Time Range    AST 27 (07/21/2023) <=34 U/L Not in Time Range    BUN 17 (07/21/2023) 9 - 23 mg/dL Not in Time Range    Calcium 10.4 (07/21/2023) 8.7 - 10.4 mg/dL Not in Time Range    Creatinine 1.83 (H) (07/21/2023) 0.73 - 1.18 mg/dL Not in Time Range    HGB 20.0 (H) (07/21/2023) 12.9 - 16.5 g/dL Not in Time Range    Platelet 141 (L) (07/21/2023) 150 - 450 10*9/L Not in Time Range    Potassium 4.4 (07/21/2023) 3.4 - 4.8 mmol/L Not in Time Range    Total Bilirubin 0.5 (07/21/2023) 0.3 - 1.2 mg/dL Not in Time Range    WBC 11.5 (H) (07/21/2023) 3.6 - 11.2 10*9/L Not in Time Range        Immunization History   Administered Date(s) Administered    COVID-19 VACCINE,MRNA(MODERNA)(PF) 02/25/2020, 03/23/2020

## 2023-08-07 DIAGNOSIS — C9101 Acute lymphoblastic leukemia, in remission: Principal | ICD-10-CM

## 2023-08-08 ENCOUNTER — Ambulatory Visit: Admit: 2023-08-08 | Discharge: 2023-08-08 | Payer: MEDICAID

## 2023-08-08 ENCOUNTER — Encounter
Admit: 2023-08-08 | Discharge: 2023-08-08 | Payer: MEDICAID | Attending: Infectious Disease | Primary: Infectious Disease

## 2023-08-08 DIAGNOSIS — A4902 Methicillin resistant Staphylococcus aureus infection, unspecified site: Principal | ICD-10-CM

## 2023-08-08 DIAGNOSIS — C9101 Acute lymphoblastic leukemia, in remission: Principal | ICD-10-CM

## 2023-08-08 DIAGNOSIS — M79609 Pain in unspecified limb: Principal | ICD-10-CM

## 2023-08-08 LAB — CBC W/ AUTO DIFF
BASOPHILS ABSOLUTE COUNT: 0.1 10*9/L (ref 0.0–0.1)
BASOPHILS RELATIVE PERCENT: 1.2 %
EOSINOPHILS ABSOLUTE COUNT: 0.5 10*9/L (ref 0.0–0.5)
EOSINOPHILS RELATIVE PERCENT: 5 %
HEMATOCRIT: 55.2 % — ABNORMAL HIGH (ref 39.0–48.0)
HEMOGLOBIN: 19 g/dL — ABNORMAL HIGH (ref 12.9–16.5)
LYMPHOCYTES ABSOLUTE COUNT: 2.3 10*9/L (ref 1.1–3.6)
LYMPHOCYTES RELATIVE PERCENT: 22.3 %
MEAN CORPUSCULAR HEMOGLOBIN CONC: 34.5 g/dL (ref 32.0–36.0)
MEAN CORPUSCULAR HEMOGLOBIN: 28.2 pg (ref 25.9–32.4)
MEAN CORPUSCULAR VOLUME: 81.9 fL (ref 77.6–95.7)
MEAN PLATELET VOLUME: 9.3 fL (ref 6.8–10.7)
MONOCYTES ABSOLUTE COUNT: 0.6 10*9/L (ref 0.3–0.8)
MONOCYTES RELATIVE PERCENT: 5.9 %
NEUTROPHILS ABSOLUTE COUNT: 6.9 10*9/L (ref 1.8–7.8)
NEUTROPHILS RELATIVE PERCENT: 65.6 %
NUCLEATED RED BLOOD CELLS: 1 /100{WBCs} (ref <=4)
PLATELET COUNT: 138 10*9/L — ABNORMAL LOW (ref 150–450)
RED BLOOD CELL COUNT: 6.74 10*12/L — ABNORMAL HIGH (ref 4.26–5.60)
RED CELL DISTRIBUTION WIDTH: 15 % (ref 12.2–15.2)
WBC ADJUSTED: 10.5 10*9/L (ref 3.6–11.2)

## 2023-08-08 LAB — URINALYSIS WITH MICROSCOPY
BACTERIA: NONE SEEN /HPF
BILIRUBIN UA: NEGATIVE
GLUCOSE UA: NEGATIVE
KETONES UA: NEGATIVE
LEUKOCYTE ESTERASE UA: NEGATIVE
NITRITE UA: NEGATIVE
PH UA: 6 (ref 5.0–9.0)
PROTEIN UA: 300 — AB
RBC UA: 1 /HPF (ref <=3)
SPECIFIC GRAVITY UA: 1.031 — ABNORMAL HIGH (ref 1.003–1.030)
SQUAMOUS EPITHELIAL: <1 /HPF (ref 0–5)
UROBILINOGEN UA: 2 — AB
WBC UA: 2 /HPF (ref <=2)

## 2023-08-08 LAB — COMPREHENSIVE METABOLIC PANEL
ALBUMIN: 4 g/dL (ref 3.4–5.0)
ALKALINE PHOSPHATASE: 108 U/L (ref 46–116)
ALT (SGPT): 41 U/L (ref 10–49)
ANION GAP: 10 mmol/L (ref 5–14)
AST (SGOT): 27 U/L (ref <=34)
BILIRUBIN TOTAL: 0.5 mg/dL (ref 0.3–1.2)
BLOOD UREA NITROGEN: 15 mg/dL (ref 9–23)
BUN / CREAT RATIO: 9
CALCIUM: 9.7 mg/dL (ref 8.7–10.4)
CHLORIDE: 105 mmol/L (ref 98–107)
CO2: 28.6 mmol/L (ref 20.0–31.0)
CREATININE: 1.7 mg/dL — ABNORMAL HIGH (ref 0.73–1.18)
EGFR CKD-EPI (2021) MALE: 51 mL/min/1.73m2 — ABNORMAL LOW (ref >=60)
GLUCOSE RANDOM: 103 mg/dL (ref 70–179)
POTASSIUM: 4 mmol/L (ref 3.4–4.8)
PROTEIN TOTAL: 6.6 g/dL (ref 5.7–8.2)
SODIUM: 144 mmol/L (ref 135–145)

## 2023-08-08 LAB — MAGNESIUM: MAGNESIUM: 1.9 mg/dL (ref 1.6–2.6)

## 2023-08-08 LAB — CK: CREATINE KINASE TOTAL: 138 U/L (ref 46.0–171.0)

## 2023-08-09 DIAGNOSIS — C9101 Acute lymphoblastic leukemia, in remission: Principal | ICD-10-CM

## 2023-08-09 MED ORDER — OXYCODONE 10 MG TABLET
ORAL_TABLET | ORAL | 0 refills | 20.00000 days | Status: CP | PRN
Start: 2023-08-09 — End: ?

## 2023-08-09 NOTE — Unmapped (Signed)
 Addended by: Briannah Lona N on: 08/09/2023 01:09 PM     Modules accepted: Orders

## 2023-08-09 NOTE — Unmapped (Signed)
 [facility] Other Call ONC Phone Room Smart Lists: Medication Refill -     Hi,    Patient Adam Keith called requesting a medication refill for the following:    Medication: oxyCODONE (ROXICODONE) 10 mg immediate release tablet   Pharmacy: [id number] [place], [place] - [address]  [address] [place], [place] [address]  Phone: [phone]  Fax: [phone]       The expected turnaround time is 3-4 business days     Thank you,  Delsie Figures  [facility]  [phone]    [facility]

## 2023-08-09 NOTE — Unmapped (Signed)
 Patient was contacted to schedule Ct and appointment.

## 2023-08-09 NOTE — Unmapped (Signed)
 Addended by: Kimberlynn Lumbra on: 08/09/2023 04:36 PM     Modules accepted: Level of Service

## 2023-08-10 ENCOUNTER — Encounter
Admit: 2023-08-10 | Discharge: 2023-08-11 | Payer: MEDICAID | Attending: Student in an Organized Health Care Education/Training Program | Primary: Student in an Organized Health Care Education/Training Program

## 2023-08-10 DIAGNOSIS — F419 Anxiety disorder, unspecified: Principal | ICD-10-CM

## 2023-08-10 DIAGNOSIS — R5383 Other fatigue: Principal | ICD-10-CM

## 2023-08-10 DIAGNOSIS — Z515 Encounter for palliative care: Principal | ICD-10-CM

## 2023-08-10 DIAGNOSIS — C9101 Acute lymphoblastic leukemia, in remission: Principal | ICD-10-CM

## 2023-08-10 DIAGNOSIS — G893 Neoplasm related pain (acute) (chronic): Principal | ICD-10-CM

## 2023-08-10 DIAGNOSIS — F32A Depression, unspecified depression type: Principal | ICD-10-CM

## 2023-08-10 MED ORDER — ELIQUIS 5 MG TABLET
ORAL_TABLET | Freq: Two times a day (BID) | ORAL | 6 refills | 0.00000 days
Start: 2023-08-10 — End: ?

## 2023-08-10 MED ORDER — OXYCODONE ER 20 MG TABLET,CRUSH RESISTANT,EXTENDED RELEASE 12 HR
ORAL_TABLET | Freq: Two times a day (BID) | ORAL | 0 refills | 28.00000 days | Status: CP
Start: 2023-08-10 — End: 2023-09-07

## 2023-08-10 NOTE — Unmapped (Signed)
 OUTPATIENT ONCOLOGY PALLIATIVE CARE     Principal Diagnosis: Adam Keith is a 43 y.o. 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 He was recently admitted to [facility] with lowe extremity pain and edema. Has had episodic fluctuations in mood.  Has decided not to pursue bone marrow transplant at this time. Has felt chronically ill for the last year.  Unclear unfortunately exactly what is contributing to this, like the combination of treatment side effects and refractory depression.  Had started to feel a little bit better with testosterone supplementation although this benefit seems to be wearing off. Appreciate oncology and psychiatry assistance in management.     Assessment/Plan:      # Depressed Mood: Chronic depression, continues to feel down due to constitutional fatigue, pain, inability to go outside and do things that bring meaning to him.  Continues to follow with Vona Grumet.  No safety concerns but continues to feel poorly overall.  -Medications per Vona Grumet        #Pain: Chronic, persistent pain, primarily in joints. Associated with/exacerbated by sitting too long, though he also says that activity seems to make it worst. Previously trialed butrans patch but did not find this effective. MRI of the spine showed narrowing in the lumbar region likely causing sciatica-like pain.  Trying to limit opioids, but given level of pain and distress impacting quality of life will start a low-dose fentanyl patch.  Also worry about experiencing withdrawal from Ponatinib in the next few weeks.    - Stopped Lyrica  - STOP Fentanyl  -Start OxyContin 20 mg p.o. every 12 hours  - oxycodone 10 mg q4 prn for breakthrough pain, using Adam-6 times a day.  Refill sent yesterday      #Persistent productive cough, ENT infections, low grade fevers  -Not addressed in detail today       #Chronic pain: At baseline, see above  - Pain management as above     #Advance care planning: Continues to prioritize quality of life.  After discussion with infectious disease as well as oncology has made the decision not to pursue another bone marrow transplant at this time.  Reviewed previously if he is considering not continuing cancer directed therapy.  He states that despite how poorly he has been feeling he is not at that stage yet.     #Controlled substances risk management:  Patient does not have a signed pain medication agreement with our team.  [place]CCS[place] database was reviewed today and it was appropriate.  Urine drug screen was not performed at this visit. Findings: not applicable.  Patient has received information about safe storage and administration of medications.  Patient has not received a prescription for narcan; is not applicable.      F/u: 6-8 weeks     ----------------------------------------  Referring Provider: Dr. Kelle Pate  Oncology Team: Malignant hematology team  PCP: Adam Peggi Bowels, Adam Keith     HPI: 43 year old man with a diagnosis of B-ALL, diagnosed in October 2021, now in complete remission. Recently admitted to the [facility] ICU 03/06/21-03/20/21 for AHRF d/t MRSA/klebsiella PNA, requiring intubation. This is following admissions in August, May, April, March, February, and January 2022.     Interval History:   Ran out of his pain meds for a day and his pain was much worse. Took a dose of Dilaudid but didn't feel like it helped. Picked up his Oxy and now it's feeling better. Having worsening pain in his legs. Says they  feel bruised all over his legs. Following closely with Dr. Elton Ham for this.     Planning to stop his chemo soon and then do his BCR able testing. Worried about having withdrawal symptoms from stopping Pazatonib. Worried about taking steroids because of risk of infections.     Still using the Fentanyl patch. Feels like it causes some nausea. Currently taking Oxycodone 10mg  5 times a day.     No nausea, vomiting, constipation. Mood remains irritable. Up and down, has had some good times, but gets very cranky when his pain isn't controlled. Has ongoing fatigue.       Palliative Performance Scale: 80% - Ambulation: Full / Normal Activity, some evidence of disease / Self-Care:Full / Intake: Normal / Level of Conscious: Full     Coping/Support Issues: Having a lot of difficulty coping with his current setback. Well supported by his wife. Also has a psychiatrist and therapist.     Goals of Care: Treatment and cancer-directed therapy     Social History:  Name of primary support: Wife Adam Keith, Adam Keith. 25, 22, 3 20 year olds.  Occupation: Was in the [employer]	[place]  Hobbies: Woodworking  Current residence / distance from [facility]: [place]     Has a 44 year old Keith.      Advance Care Planning: Did not discuss this visit.        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    Onion Anaphylaxis    Vancomycin Analogues Other (See Comments)       Hearing loss with Lasix     Other reaction(s): Other (See Comments)     Hearing loss     lasix     Hearing loss      lasix      Hearing loss with Lasix Other reaction(s): Other (See Comments) Hearing loss lasix    Bismuth Subsalicylate Nausea And Vomiting    Privigen [Immun Glob G(Igg)-Pro-Iga 0-50] Other (See Comments)       03/10/2021 VIG infusion stopped as patient developed rigors, tachycardia and HTN during infusion despite pre-medication; 03/12/21 completed IVIG with premedications and slower rate of infusion    Furosemide Other (See Comments)       With Vancomycin caused hearing loss     Other reaction(s): Other (See Comments)     With Vancomycin caused hearing loss      With Vancomycin caused hearing loss Other reaction(s): Other (See Comments) With Vancomycin caused hearing loss    Gammagard           Family History:  Cancer-related family history includes Cancer in his maternal grandfather and mother. There is no history of Melanoma.  He indicated that the status of his mother is unknown. He indicated that the status of his father is unknown. He indicated that the status of his sister is unknown. He indicated that the status of his brother is unknown. He indicated that the status of his maternal grandmother is unknown. He  indicated that the status of his maternal grandfather is unknown. He indicated that the status of his paternal grandmother is unknown. He indicated that the status of his paternal grandfather is unknown. He indicated that the status of his maternal aunt is unknown. He indicated that the status of his maternal uncle is unknown. He indicated that the status of his paternal aunt is unknown. He indicated that the status of his paternal uncle is unknown. He indicated that the status of his neg hx is unknown.        REVIEW OF SYSTEMS:  A comprehensive review of 10 systems was negative except for pertinent positives noted in HPI.           Lab Results   Component Value Date     CREATININE 1.93 (H) 02/22/2022            Lab Results   Component Value Date     ALKPHOS 92 02/22/2022     BILITOT 0.Adam 02/22/2022     BILIDIR 0.20 02/07/2022     PROT 6.6 02/22/2022     ALBUMIN Adam.1 02/22/2022     ALT 96 (H) 02/22/2022     AST 45 (H) 02/22/2022      Physical exam:  Gen: NAD  Pulm: nl WOB  Skin: Dry and red  Psych: Guarded and dysphoric      The patient reports they are physically located in [place]  and is currently: at home. I conducted a audio/video visit. I spent  69m 41s on the video call with the patient. I spent an additional 15 minutes on pre- and post-visit activities on the date of service .       Rhiann Boucher, Adam Keith  [facility] Outpatient Oncology Palliative Care

## 2023-08-10 NOTE — Unmapped (Signed)
 Pt is requesting refill    Most recent clinic visit: 06/14/2023  Next clinic visit:  08/10/2023

## 2023-08-10 NOTE — Unmapped (Signed)
 Prior Authorization submitted Aug 10, 2023 for OxyContin 20 mg Qty 60 for 30 days.    Insurance information: [place] [employer]    BIN:   PCN:   Grp:   ID: [id number]      Time spent: 5 min      Confirmation #: [id number]  Prior Approval #: [id number]  Status: APPROVED    APPROVED per provider portal; approval submitted for 365 days.    Jearld Min, PharmD, CPP  Outpatient Oncology Palliative Care  Aug 10, 2023 3:05 PM

## 2023-08-14 DIAGNOSIS — C9101 Acute lymphoblastic leukemia, in remission: Principal | ICD-10-CM

## 2023-08-16 DIAGNOSIS — C9101 Acute lymphoblastic leukemia, in remission: Principal | ICD-10-CM

## 2023-08-18 DIAGNOSIS — C9101 Acute lymphoblastic leukemia, in remission: Principal | ICD-10-CM

## 2023-08-18 NOTE — Unmapped (Signed)
 NN added to care team.

## 2023-08-21 DIAGNOSIS — C9101 Acute lymphoblastic leukemia, in remission: Principal | ICD-10-CM

## 2023-08-21 NOTE — Unmapped (Signed)
 I spoke with patient Adam Keith to confirm appointments on the following date(s):     6/12    Carly R Tarry Farmer

## 2023-08-22 ENCOUNTER — Inpatient Hospital Stay: Admit: 2023-08-22 | Discharge: 2023-08-23 | Payer: MEDICAID

## 2023-08-22 MED ADMIN — iohexol (OMNIPAQUE) 350 mg iodine/mL solution 150 mL: 150 mL | INTRAVENOUS | @ 23:00:00 | Stop: 2023-08-22

## 2023-08-23 DIAGNOSIS — C9101 Acute lymphoblastic leukemia, in remission: Principal | ICD-10-CM

## 2023-08-23 MED ORDER — NORTRIPTYLINE 50 MG CAPSULE
ORAL_CAPSULE | ORAL | 3 refills | 0.00000 days | Status: CP
Start: 2023-08-23 — End: ?

## 2023-08-24 DIAGNOSIS — C9100 Acute lymphoblastic leukemia not having achieved remission: Principal | ICD-10-CM

## 2023-08-24 DIAGNOSIS — C9101 Acute lymphoblastic leukemia, in remission: Principal | ICD-10-CM

## 2023-08-24 DIAGNOSIS — I701 Atherosclerosis of renal artery: Principal | ICD-10-CM

## 2023-08-24 MED ORDER — ELIQUIS 5 MG TABLET
ORAL_TABLET | Freq: Two times a day (BID) | ORAL | 6 refills | 30.00000 days | Status: CP
Start: 2023-08-24 — End: ?

## 2023-08-24 NOTE — Unmapped (Signed)
 Vascular radiology referral.    Patient with Ph+ B-ALL in remission on long-term ponatinib + ASA. PMHx significant for tobacco use, PE/DVT, chronic pain, COPD.     CTA, ordered by the ICID team to evaluate lower extremity weakness/pain identified flow-limiting left renal artery stenosis (08/08/23).     Referral to Vascular Surgery placed for further evaluation.    Abelina Hoes, AGPCNP-BC  Nurse Practitioner  Hematology/Oncology  [facility]

## 2023-08-25 NOTE — Unmapped (Signed)
 Referral received for incidental finding of renal artery stenosis on CT imaging, duplex ordered ready to schedule.

## 2023-08-27 DIAGNOSIS — C9101 Acute lymphoblastic leukemia, in remission: Principal | ICD-10-CM

## 2023-08-30 ENCOUNTER — Ambulatory Visit: Admit: 2023-08-30 | Discharge: 2023-08-31 | Payer: MEDICAID

## 2023-08-30 DIAGNOSIS — C9101 Acute lymphoblastic leukemia, in remission: Principal | ICD-10-CM

## 2023-08-30 DIAGNOSIS — D751 Secondary polycythemia: Principal | ICD-10-CM

## 2023-08-30 LAB — CBC
HEMATOCRIT: 51.5 % — ABNORMAL HIGH (ref 39.0–48.0)
HEMOGLOBIN: 17.5 g/dL — ABNORMAL HIGH (ref 12.9–16.5)
MEAN CORPUSCULAR HEMOGLOBIN CONC: 33.9 g/dL (ref 32.0–36.0)
MEAN CORPUSCULAR HEMOGLOBIN: 28.3 pg (ref 25.9–32.4)
MEAN CORPUSCULAR VOLUME: 83.4 fL (ref 77.6–95.7)
MEAN PLATELET VOLUME: 9.1 fL (ref 6.8–10.7)
PLATELET COUNT: 192 10*9/L (ref 150–450)
RED BLOOD CELL COUNT: 6.18 10*12/L — ABNORMAL HIGH (ref 4.26–5.60)
RED CELL DISTRIBUTION WIDTH: 15.6 % — ABNORMAL HIGH (ref 12.2–15.2)
WBC ADJUSTED: 11.3 10*9/L — ABNORMAL HIGH (ref 3.6–11.2)

## 2023-08-30 NOTE — Unmapped (Signed)
 Pt here for labs and therapeutic phlebotomy. Labs and therapy plan reviewed.  18g apheresis needle inserted to left AC.   500 ml whole blood removed.   Needle removed, gauze and coban applied over site. Site without complications. Bottle water given and encouraged. Tolerated well. Left clinic in stable condition.   Blood discarded in biohazard medical waste.

## 2023-08-30 NOTE — Unmapped (Signed)
 Hematocrit was 51.5, fine to resume your testosterone and recheck labs in a month. Gurney Lefort

## 2023-08-31 DIAGNOSIS — C9101 Acute lymphoblastic leukemia, in remission: Principal | ICD-10-CM

## 2023-08-31 DIAGNOSIS — E291 Testicular hypofunction: Principal | ICD-10-CM

## 2023-08-31 MED ORDER — ICLUSIG 30 MG TABLET
ORAL_TABLET | Freq: Every day | ORAL | 5 refills | 30.00000 days
Start: 2023-08-31 — End: ?

## 2023-08-31 NOTE — Unmapped (Signed)
 [facility] Clinical Assessment & Refill Coordination Note    Adam Keith, DOB: 1980/05/30  Phone: [phone] (work)    All above HIPAA information was verified with patient's family member, wife.     Was a Nurse, learning disability used for this call? No    Specialty Medication(s):   Hematology/Oncology: Iclusig     Current Outpatient Medications   Medication Sig Dispense Refill    albuterol HFA 90 mcg/actuation inhaler Inhale 2 puffs every six (6) hours as needed for wheezing. 18 g 0    amLODIPine (NORVASC) 10 MG tablet Take 1 tablet (10 mg total) by mouth daily. 30 tablet 0    arm brace (WRIST BRACE) Misc 1 Piece by Miscellaneous route as needed. left ulnar wrist brace 1 each 0    carvediloL (COREG) 12.5 MG tablet Take 1 tablet (12.5 mg total) by mouth Two (2) times a day. 180 tablet 1    cholecalciferol, vitamin D3-250 mcg, 10,000 unit,, 250 mcg (10,000 unit) capsule Take 1 capsule (250 mcg total) by mouth daily. 120 capsule 3    docusate sodium (COLACE) 100 MG capsule Take 1 capsule (100 mg total) by mouth daily.      doxycycline (VIBRA-TABS) 100 MG tablet Take 1 tablet (100 mg total) by mouth two (2) times a day. 180 tablet 3    ELIQUIS 5 mg Tab TAKE 1 TABLET BY MOUTH TWO TIMES A DAY. 60 tablet 6    hydroCHLOROthiazide 12.5 MG tablet Take 1 tablet (12.5 mg total) by mouth daily. 30 tablet 11    naloxone (NARCAN) 4 mg nasal spray One spray in either nostril once for known/suspected opioid overdose. May repeat every 2-3 minutes in alternating nostril til EMS arrives 2 each 0    nortriptyline (PAMELOR) 50 MG capsule TAKE 2 CAPSULES BY MOUTH EVERY DAY AT NIGHT 180 capsule 3    omeprazole (PRILOSEC) 40 MG capsule Take 1 capsule (40 mg total) by mouth every morning.      ondansetron (ZOFRAN-ODT) 4 MG disintegrating tablet Take 1 tablet (4 mg total) by mouth every eight (8) hours as needed for nausea.      oxyCODONE (OXYCONTIN) 20 mg TR12 12 hr crush resistant ER/CR tablet Take 1 tablet (20 mg total) by mouth every twelve (12) hours for 28 days. G89.3 Cancer related pain. Please call [phone] for dispense or PA issues or fax [phone]. Please fill today. 56 tablet 0    oxyCODONE (ROXICODONE) 10 mg immediate release tablet Take 1 tablet (10 mg total) by mouth every four (4) hours as needed for pain. 120 tablet 0    PONATinib (ICLUSIG) 30 mg tablet Take 1 tablet (30 mg total) by mouth daily. Swallow tablets whole. Do not crush, break, cut or chew tablets. 30 tablet 5    spironolactone (ALDACTONE) 25 MG tablet TAKE 1 TABLET (25 MG TOTAL) BY MOUTH DAILY. 90 tablet 3    sulfamethoxazole-trimethoprim (BACTRIM DS) 800-160 mg per tablet Take 1 tablet (160 mg of trimethoprim total) by mouth two (2) times a day. TAKE 1 TABLET BY MOUTH 2 TIMES A DAY ON SATURDAY, SUNDAY. FOR PROPHYLAXIS 48 tablet 3    syringe with needle, safety (BD ECLIPSE LUER-LOK) 3 mL 22 gauge x 1 1/2 Syrg To be used with TC injections 50 each 0    testosterone cypionate (DEPOTESTOTERONE CYPIONATE) 200 mg/mL injection Inject 0.5 mL (100 mg total) into the muscle once a week. Single Use Vials 4 mL 5    tiotropium-olodaterol (STIOLTO  RESPIMAT) 2.5-2.5 mcg/actuation Mist Inhale 2 puffs daily. 4 g 3    tiotropium-olodaterol (STIOLTO RESPIMAT) 2.5-2.5 mcg/actuation Mist Inhale 2 puffs daily. 4 g 0    tiotropium-olodaterol (STIOLTO RESPIMAT) 2.5-2.5 mcg/actuation Mist Inhale 2 puffs daily. 4 g 0    valACYclovir (VALTREX) 500 MG tablet Take 1 tablet (500 mg total) by mouth daily. 90 tablet 3     No current facility-administered medications for this visit.        Changes to medications: Grady reports no changes at this time.    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    Onion Anaphylaxis    Vancomycin Analogues Other (See Comments)     Hearing loss with Lasix    Other reaction(s): Other (See Comments)    Hearing loss    lasix    Hearing loss      lasix      Hearing loss with Lasix Other reaction(s): Other (See Comments) Hearing loss lasix    Bismuth Subsalicylate Nausea And Vomiting    Privigen [Immun Glob G(Igg)-Pro-Iga 0-50] Other (See Comments)     03/10/2021 VIG infusion stopped as patient developed rigors, tachycardia and HTN during infusion despite pre-medication; 03/12/21 completed IVIG with premedications and slower rate of infusion    Furosemide Other (See Comments)     With Vancomycin caused hearing loss    Other reaction(s): Other (See Comments)    With Vancomycin caused hearing loss      With Vancomycin caused hearing loss Other reaction(s): Other (See Comments) With Vancomycin caused hearing loss    Gammagard        Changes to allergies: No    Allergies have been reviewed and updated in Epic: Yes    SPECIALTY MEDICATION ADHERENCE     Iclusig 30 mg: 14 days of medicine on hand     Medication Adherence    Patient reported X missed doses in the last month: >5  Specialty Medication: Iclusig 30 mg once daily  Patient is on additional specialty medications: No  Informant: spouse  Confirmed plan for next specialty medication refill: delivery by pharmacy  Refills needed for supportive medications: not needed          Are there any concerns with adherence? No    Adherence counseling provided? Not needed, medication is on hold by provider.  He will resume as of 09/01/23    Patient-Reported Symptoms Tracker for Cancer Patients on Oral Chemotherapy     Oral chemotherapy medication name(s): IClusig  Dose and frequency: 30 mg once daily  Oral Chemotherapy Start Date:    Baseline? No  Clinic(s) visited: Hematology  Were you able to reach the patient on a call today? Yes    Symptom Grouping Question Patient Response   Digestion and Eating Have you felt sick to your stomach? Denies    Had diarrhea? Denies    Constipated? Denies    Not wanting to eat? Denies    Comments      Sleep and Pain Felt very tired even after you rest? Denies  Pain due to cancer medication or cancer? Denies    Comments     Other Side Effects Numbness or tingling in hands and/or feet? Denies    Felt short of breath? Denies    Mouth or throat Sores? Denies    Rash? Denies    Palmar-plantar erythrodysesthesia syndrome?      Rash - acneiform?      Rash - maculo-papular?      How many days over the past month did your cancer medication or cancer keep you from your normal activities?  Write in number of days, 0-30:  0    Other side effects or things you would like to discuss?      Comments?     Adherence  In the last 30 days, on how many days did you miss at least one dose of any of your [drug name]? Write in number of days, 0-30:  0    What reasons are you having trouble taking your medication [pharmacist: check all that apply]? Specify chemotherapy cycle:        No problems identified    Comments:        Comments       Optional Symptom Tracking  New start - hematology ([facility]    Comments:      Specialty Pharmacist Interventions:     Question Patient Response    What actions did you take in response to the patient's PRO answers? No action required   Other (Specify):          CLINICAL MANAGEMENT AND INTERVENTION      Clinical Benefit Assessment:    Do you feel the medicine is effective or helping your condition? Yes    Clinical Benefit counseling provided? Not needed    Acute Infection Status:    Acute infections noted within Epic:  No active infections    Patient reported infection: None    Therapy Appropriateness:    Is therapy appropriate based on current medication list, adverse reactions, adherence, clinical benefit and progress toward achieving therapeutic goals?  Yes, therapy is appropriate and should be continued    DISEASE/MEDICATION-SPECIFIC INFORMATION      N/A    Is the patient receiving adequate infection prevention treatment? Not applicable    Does the patient have adequate nutritional support? Not applicable    PATIENT SPECIFIC NEEDS     Does the patient have any physical, cognitive, or cultural barriers? No    Is the patient high risk? No    Did the patient require a clinical intervention? No    Does the patient require physician intervention or other additional services (i.e., nutrition, smoking cessation, social work)? No    Does the patient have an additional or emergency contact listed in their chart? Yes    SOCIAL DETERMINANTS OF HEALTH     At the [facility], we have learned that life circumstances - like trouble affording food, housing, utilities, or transportation can affect the health of many of our patients.   That is why we wanted to ask: are you currently experiencing any life circumstances that are negatively impacting your health and/or quality of life? Patient declined to answer    Social Drivers of Health     Food Insecurity: No Food Insecurity (07/04/2022)    Hunger Vital Sign     Worried About Running Out of Food in the Last Year: Never true     Ran Out of Food in the Last  Year: Never true   Tobacco Use: High Risk (07/11/2023)    Patient History     Smoking Tobacco Use: Every Day     Smokeless Tobacco Use: Former     Passive Exposure: Current   Transportation Needs: No Transportation Needs (07/04/2022)    PRAPARE - Therapist, art (Medical): No     Lack of Transportation (Non-Medical): No   Alcohol Use: Not on file   Housing: Low Risk  (07/04/2022)    Housing     Within the past 12 months, have you ever stayed: outside, in a car, in a tent, in an overnight shelter, or temporarily in someone else's home (i.e. couch-surfing)?: No     Are you worried about losing your housing?: No   Physical Activity: Not on file   Utilities: Low Risk  (07/04/2022)    Utilities     Within the past 12 months, have you been unable to get utilities (heat, electricity) when it was really needed?: No   Stress: Not on file   Interpersonal Safety: Not on file   Substance Use: Not on file (02/06/2023)   Intimate Partner Violence: Unknown (07/07/2021)    Received from [facility]    HITS     Physically Hurt: Not on file     Insult or Talk Down To: Not on file     Threaten Physical Harm: Not on file     Scream or Curse: Not on file   Social Connections: Unknown (08/09/2021)    Received from [facility], [facility]    Social Network     Social Network: Not on file   Financial Resource Strain: Low Risk  (07/04/2022)    Overall Financial Resource Strain (CARDIA)     Difficulty of Paying Living Expenses: Not hard at all   Health Literacy: Not on file   Internet Connectivity: Not on file       Would you be willing to receive help with any of the needs that you have identified today? Not applicable       SHIPPING     Specialty Medication(s) to be Shipped:   Hematology/Oncology: Iclusig    Other medication(s) to be shipped: No additional medications requested for fill at this time     Changes to insurance: No    Delivery Scheduled: Yes, Expected medication delivery date: 09/08/23.     Medication will be delivered via UPS to the confirmed prescription address in [facility].    The patient will receive a drug information handout for each medication shipped and additional FDA Medication Guides as required.  Verified that patient has previously received a Conservation officer, historic buildings and a Surveyor, mining.    The patient or caregiver noted above participated in the development of this care plan and knows that they can request review of or adjustments to the care plan at any time.      All of the patient's questions and concerns have been addressed.    Wilbern Hancock, [facility]   [facility]  Pharmacist

## 2023-09-01 ENCOUNTER — Ambulatory Visit: Admit: 2023-09-01 | Discharge: 2023-09-02 | Payer: MEDICAID

## 2023-09-01 LAB — CBC W/ AUTO DIFF
BASOPHILS ABSOLUTE COUNT: 0.2 10*9/L — ABNORMAL HIGH (ref 0.0–0.1)
BASOPHILS RELATIVE PERCENT: 1.5 %
EOSINOPHILS ABSOLUTE COUNT: 0.5 10*9/L (ref 0.0–0.5)
EOSINOPHILS RELATIVE PERCENT: 3.7 %
HEMATOCRIT: 46.9 % (ref 39.0–48.0)
HEMOGLOBIN: 16.4 g/dL (ref 12.9–16.5)
LYMPHOCYTES ABSOLUTE COUNT: 2.6 10*9/L (ref 1.1–3.6)
LYMPHOCYTES RELATIVE PERCENT: 21.5 %
MEAN CORPUSCULAR HEMOGLOBIN CONC: 34.9 g/dL (ref 32.0–36.0)
MEAN CORPUSCULAR HEMOGLOBIN: 28.4 pg (ref 25.9–32.4)
MEAN CORPUSCULAR VOLUME: 81.4 fL (ref 77.6–95.7)
MEAN PLATELET VOLUME: 8.9 fL (ref 6.8–10.7)
MONOCYTES ABSOLUTE COUNT: 0.7 10*9/L (ref 0.3–0.8)
MONOCYTES RELATIVE PERCENT: 5.7 %
NEUTROPHILS ABSOLUTE COUNT: 8.3 10*9/L — ABNORMAL HIGH (ref 1.8–7.8)
NEUTROPHILS RELATIVE PERCENT: 67.6 %
NUCLEATED RED BLOOD CELLS: 0 /100{WBCs} (ref <=4)
PLATELET COUNT: 181 10*9/L (ref 150–450)
RED BLOOD CELL COUNT: 5.77 10*12/L — ABNORMAL HIGH (ref 4.26–5.60)
RED CELL DISTRIBUTION WIDTH: 15.3 % — ABNORMAL HIGH (ref 12.2–15.2)
WBC ADJUSTED: 12.3 10*9/L — ABNORMAL HIGH (ref 3.6–11.2)

## 2023-09-01 LAB — COMPREHENSIVE METABOLIC PANEL
ALBUMIN: 4 g/dL (ref 3.4–5.0)
ALKALINE PHOSPHATASE: 95 U/L (ref 46–116)
ALT (SGPT): 25 U/L (ref 10–49)
ANION GAP: 13 mmol/L (ref 5–14)
AST (SGOT): 16 U/L (ref <=34)
BILIRUBIN TOTAL: 0.5 mg/dL (ref 0.3–1.2)
BLOOD UREA NITROGEN: 15 mg/dL (ref 9–23)
BUN / CREAT RATIO: 8
CALCIUM: 9.6 mg/dL (ref 8.7–10.4)
CHLORIDE: 102 mmol/L (ref 98–107)
CO2: 27.9 mmol/L (ref 20.0–31.0)
CREATININE: 1.83 mg/dL — ABNORMAL HIGH (ref 0.73–1.18)
EGFR CKD-EPI (2021) MALE: 46 mL/min/1.73m2 — ABNORMAL LOW (ref >=60)
GLUCOSE RANDOM: 115 mg/dL (ref 70–179)
POTASSIUM: 3.7 mmol/L (ref 3.5–5.1)
PROTEIN TOTAL: 6.1 g/dL (ref 5.7–8.2)
SODIUM: 143 mmol/L (ref 135–145)

## 2023-09-01 LAB — TESTOSTERONE: TESTOSTERONE TOTAL: 52 ng/dL — ABNORMAL LOW (ref 197–670)

## 2023-09-01 LAB — PSA: PROSTATE SPECIFIC ANTIGEN: 0.21 ng/mL (ref 0.00–4.00)

## 2023-09-06 DIAGNOSIS — C9101 Acute lymphoblastic leukemia, in remission: Principal | ICD-10-CM

## 2023-09-06 DIAGNOSIS — D751 Secondary polycythemia: Principal | ICD-10-CM

## 2023-09-06 MED ORDER — OXYCODONE ER 20 MG TABLET,CRUSH RESISTANT,EXTENDED RELEASE 12 HR
ORAL_TABLET | Freq: Three times a day (TID) | ORAL | 0 refills | 28.00000 days | Status: CP
Start: 2023-09-06 — End: 2023-10-04

## 2023-09-06 MED ORDER — PONATINIB 30 MG TABLET
ORAL_TABLET | Freq: Every day | ORAL | 5 refills | 30.00000 days | Status: CP
Start: 2023-09-06 — End: ?
  Filled 2023-09-11: qty 30, 30d supply, fill #0

## 2023-09-07 ENCOUNTER — Emergency Department: Admit: 2023-09-07 | Discharge: 2023-09-08 | Disposition: A | Discharge status: Home / Self Care | Payer: MEDICAID

## 2023-09-07 DIAGNOSIS — C9101 Acute lymphoblastic leukemia, in remission: Principal | ICD-10-CM

## 2023-09-07 LAB — CBC W/ AUTO DIFF
BASOPHILS ABSOLUTE COUNT: 0.1 10*9/L (ref 0.0–0.1)
BASOPHILS RELATIVE PERCENT: 1.2 %
EOSINOPHILS ABSOLUTE COUNT: 0.2 10*9/L (ref 0.0–0.5)
EOSINOPHILS RELATIVE PERCENT: 3.4 %
HEMATOCRIT: 48.3 % — ABNORMAL HIGH (ref 39.0–48.0)
HEMOGLOBIN: 16.9 g/dL — ABNORMAL HIGH (ref 12.9–16.5)
LYMPHOCYTES ABSOLUTE COUNT: 1.7 10*9/L (ref 1.1–3.6)
LYMPHOCYTES RELATIVE PERCENT: 27.2 %
MEAN CORPUSCULAR HEMOGLOBIN CONC: 35 g/dL (ref 32.0–36.0)
MEAN CORPUSCULAR HEMOGLOBIN: 28.7 pg (ref 25.9–32.4)
MEAN CORPUSCULAR VOLUME: 82.2 fL (ref 77.6–95.7)
MEAN PLATELET VOLUME: 9 fL (ref 6.8–10.7)
MONOCYTES ABSOLUTE COUNT: 0.8 10*9/L (ref 0.3–0.8)
MONOCYTES RELATIVE PERCENT: 12.8 %
NEUTROPHILS ABSOLUTE COUNT: 3.4 10*9/L (ref 1.8–7.8)
NEUTROPHILS RELATIVE PERCENT: 55.4 %
PLATELET COUNT: 160 10*9/L (ref 150–450)
RED BLOOD CELL COUNT: 5.88 10*12/L — ABNORMAL HIGH (ref 4.26–5.60)
RED CELL DISTRIBUTION WIDTH: 15.8 % — ABNORMAL HIGH (ref 12.2–15.2)
WBC ADJUSTED: 6.2 10*9/L (ref 3.6–11.2)

## 2023-09-07 LAB — URINALYSIS WITH MICROSCOPY WITH CULTURE REFLEX PERFORMABLE
BACTERIA: NONE SEEN /HPF
BILIRUBIN UA: NEGATIVE
BLOOD UA: NEGATIVE
GLUCOSE UA: NEGATIVE
KETONES UA: NEGATIVE
LEUKOCYTE ESTERASE UA: NEGATIVE
NITRITE UA: NEGATIVE
PH UA: 6.5 (ref 5.0–9.0)
PROTEIN UA: 200 — AB
RBC UA: 1 /HPF (ref <=3)
SPECIFIC GRAVITY UA: 1.05 — ABNORMAL HIGH (ref 1.003–1.030)
SQUAMOUS EPITHELIAL: <1 /HPF (ref 0–5)
UROBILINOGEN UA: <2
WBC UA: 1 /HPF (ref <=2)

## 2023-09-07 LAB — COMPREHENSIVE METABOLIC PANEL
ALBUMIN: 4.1 g/dL (ref 3.4–5.0)
ALKALINE PHOSPHATASE: 95 U/L (ref 46–116)
ALT (SGPT): 17 U/L (ref 10–49)
ANION GAP: 6 mmol/L (ref 5–14)
AST (SGOT): 16 U/L (ref <=34)
BILIRUBIN TOTAL: 0.5 mg/dL (ref 0.3–1.2)
BLOOD UREA NITROGEN: 14 mg/dL (ref 9–23)
BUN / CREAT RATIO: 8
CALCIUM: 9.7 mg/dL (ref 8.7–10.4)
CHLORIDE: 107 mmol/L (ref 98–107)
CO2: 28 mmol/L (ref 20.0–31.0)
CREATININE: 1.71 mg/dL — ABNORMAL HIGH (ref 0.73–1.18)
EGFR CKD-EPI (2021) MALE: 50 mL/min/1.73m2 — ABNORMAL LOW (ref >=60)
GLUCOSE RANDOM: 104 mg/dL (ref 70–179)
POTASSIUM: 4.2 mmol/L (ref 3.4–4.8)
PROTEIN TOTAL: 6.6 g/dL (ref 5.7–8.2)
SODIUM: 141 mmol/L (ref 135–145)

## 2023-09-07 LAB — PHOSPHORUS: PHOSPHORUS: 2.4 mg/dL (ref 2.4–5.1)

## 2023-09-07 LAB — PRO-BNP: PRO-BNP: 108 pg/mL (ref <=300.0)

## 2023-09-07 LAB — MAGNESIUM: MAGNESIUM: 1.8 mg/dL (ref 1.6–2.6)

## 2023-09-07 MED ADMIN — morphine 4 mg/mL injection 4 mg: 4 mg | INTRAVENOUS | @ 23:00:00 | Stop: 2023-09-07

## 2023-09-07 NOTE — Unmapped (Signed)
 Returned call to patient to follow-up on symptoms reported to triage and via mychart encounter. Patient reports feeling poor in addition to congestion, shortness of breath, fatigue, and dizziness. Report he has been unable to tolerate activity >5 minutes at a time. Advised patient that he needs to be evaluated in the ED. Patient plans to come to the ED at [facility] for eval.

## 2023-09-07 NOTE — Unmapped (Signed)
 Copied from CRM #[id number]. Topic: Symptom Management - Urgent Symptoms  >> Sep 07, 2023  1:15 PM Kathey Pang wrote:    Adam Keith has contacted the Communication Center on behalf of Adam Keith in regards to the following symptom:     Dizziness, Lightheadedness and Extreme fatigue    Caller reports that patient's treatment status is active chemotherapy.    Please contact Tayvien at [phone].    Symptoms deemed to be Urgent are marked High Priority    Thank you,  Noelle Batten   [facility]   [phone]

## 2023-09-07 NOTE — Unmapped (Signed)
 Pt here with SOB, increased pain, and feels lightheaded. Pt on chemo.

## 2023-09-07 NOTE — Unmapped (Signed)
 [facility]  Emergency Department Provider Note    ED Clinical Impression     Final diagnoses:   Parainfluenza (Primary)   Chest pain, unspecified type       HPI, ED Course, Assessment and Plan     Initial Clinical Impression:    September 07, 2023 5:38 PM   Adam Keith is a 43 y.o. 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 Patient endorses congestion, SOB, fatigue, and dizziness, decreased activity  tolerance. Spoke with oncology over the phone today who recommended coming to the ED for evaluation.  Patient reports he was taken off his chemotherapy 16 days ago to see how he would tolerate.  For the past 3 days, he has been feeling shortness of breath at rest and with exertion.  He has had a nonproductive cough.  He has also been feeling lightheaded and presyncopal.  Endorses fevers.  Denies chest pain, nausea, vomiting.    BP 146/90  - Pulse 84  - Temp 36.6 ??C (97.9 ??F) (Oral)  - Resp 12  - Wt (!) 118.4 kg (261 lb 0.4 oz)  - SpO2 91%  - BMI 34.44 kg/m??     Medical Decision Making  Patient presents with vague shortness of breath/dyspnea. Differential diagnosis is broad. Will obtain troponin and ECG to evaluate for cardiac ischemia or other cardiac causes, BNP to evaluate for congestive heart failure, CBC to evaluate for anemia, electrolyte panel to evaluate for renal dysfunction or other electrolyte abnormalities, and chest x-ray to evaluate for pneumonia, pneumothorax. Viral swab to evaluate for influenza/covid. Will obtain POCUS to evaluate further for cardiac function and for presence/absence of pericardial effusion.  Concern for PE given patient's history of malignancy and presentation with shortness of breath.  Patient is compliant with Eliquis.  Not consistent with asthma, COPD exacerbation, allergic etiologies.    Further ED updates and updates to plan as per ED Course below:    ED Course as of 09/08/23 1558   Thu Sep 07, 2023 1741 ECG 12 Lead  EKG shows normal sinus rhythm with no ST changes concerning for ischemia.  Normal QTc.   1813 XR Chest Portable  On my interpretation, right-sided haziness and edema, possible pleural effusion in right lower lobe.   1902 Creatinine(!): 1.71  At baseline/improved from most recent 7 days ago which was 1.83.   2204 Parainfluenza 3(!): Detected   2300 Patient was reevaluated at bedside.  Patient is resting comfortably in no acute distress.  Patient feeling improvement after pain control with dilaudid. Patient to be signed out to oncoming provider pending CTA chest. If negative for acute pathology, anticipate patient can be discharged safely with strict return precautions.         External Records Reviewed:     Independent Interpretation of Studies: I have independently interpreted the following studies:  CXR, EKG    Discussion of Management With Other Providers or Support Staff: I discussed the management of this patient with the:  Attending physician    Considerations Regarding Disposition/Escalation of Care and Critical Care:  Pending CTA imaging                Social Drivers of Health with Concerns     Tobacco Use: High Risk (09/07/2023)    Patient History     Smoking Tobacco Use: Every Day     Smokeless Tobacco Use: Former     Passive Exposure: Current   Alcohol Use:  Not on file   Physical Activity: Not on file   Stress: Not on file   Substance Use: Not on file (02/06/2023)   Social Connections: Unknown (08/09/2021)    Received from [facility]    Social Network     Social Network: Not on file   Health Literacy: Not on file   Internet Connectivity: Not on file     _____________________________________________________________________    The case was discussed with the attending physician who is in agreement with the above assessment and plan    Past History     PAST MEDICAL HISTORY/PAST SURGICAL HISTORY:   Past Medical History:   Diagnosis Date    ALL (acute lymphoblastic leukemia) COVID-19 05/31/2020    MRSA bacteremia 01/28/2020    Pneumonia of right lung due to methicillin resistant Staphylococcus aureus (MRSA)   03/09/2021    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    Septic shock due to Staphylococcus aureus   01/28/2020    Tear of medial meniscus of knee 10/25/2016    Testicular pain, left 01/21/2022       Past Surgical History:   Procedure Laterality Date    BONE MARROW BIOPSY & ASPIRATION  01/21/2020         CHG US , CHEST,REAL TIME  11/20/2020    Procedure: ULTRASOUND, CHEST, REAL TIME WITH IMAGE DOCUMENTATION;  Surgeon: Vera Gip, MD;  Location: [facility]ON[facility] PROCEDURE LAB [facility];  Service: Pulmonary    IR INSERT PORT AGE GREATER THAN 5 YRS  03/10/2020    IR INSERT PORT AGE GREATER THAN 5 YRS 03/10/2020 Junella Olden, MD IMG VIR H&V [facility]    PR BRONCHOSCOPY,DIAGNOSTIC W LAVAGE Bilateral 07/20/2020    Procedure: BRONCHOSCOPY, RIGID OR FLEXIBLE, INCLUDE FLUOROSCOPIC GUIDANCE WHEN PERFORMED; W/BRONCHIAL ALVEOLAR LAVAGE WITH MODERATE SEDATION;  Surgeon: Carole Churches, MD;  Location: BRON[facility] PROCEDURE LAB [facility];  Service: Pulmonary       MEDICATIONS:   No current facility-administered medications for this encounter.    Current Outpatient Medications:     albuterol HFA 90 mcg/actuation inhaler, Inhale 2 puffs every six (6) hours as needed for wheezing., Disp: 18 g, Rfl: 0    amLODIPine (NORVASC) 10 MG tablet, Take 1 tablet (10 mg total) by mouth daily., Disp: 30 tablet, Rfl: 0    arm brace (WRIST BRACE) Misc, 1 Piece by Miscellaneous route as needed. left ulnar wrist brace, Disp: 1 each, Rfl: 0    carvediloL (COREG) 12.5 MG tablet, Take 1 tablet (12.5 mg total) by mouth Two (2) times a day., Disp: 180 tablet, Rfl: 1    cholecalciferol, vitamin D3-250 mcg, 10,000 unit,, 250 mcg (10,000 unit) capsule, Take 1 capsule (250 mcg total) by mouth daily., Disp: 120 capsule, Rfl: 3    docusate sodium (COLACE) 100 MG capsule, Take 1 capsule (100 mg total) by mouth daily., Disp: , Rfl:     doxycycline (VIBRA-TABS) 100 MG tablet, Take 1 tablet (100 mg total) by mouth two (2) times a day., Disp: 180 tablet, Rfl: 3    ELIQUIS 5 mg Tab, TAKE 1 TABLET BY MOUTH TWO TIMES A DAY., Disp: 60 tablet, Rfl: 6    hydroCHLOROthiazide 12.5 MG tablet, Take 1 tablet (12.5 mg total) by mouth daily., Disp: 30 tablet, Rfl: 11    naloxone (NARCAN) 4 mg nasal spray, One spray in either nostril once for known/suspected opioid overdose. May repeat every 2-3 minutes in alternating nostril til EMS arrives, Disp: 2 each,  Rfl: 0    nortriptyline (PAMELOR) 50 MG capsule, TAKE 2 CAPSULES BY MOUTH EVERY DAY AT NIGHT, Disp: 180 capsule, Rfl: 3    omeprazole (PRILOSEC) 40 MG capsule, Take 1 capsule (40 mg total) by mouth every morning., Disp: , Rfl:     ondansetron (ZOFRAN-ODT) 4 MG disintegrating tablet, Take 1 tablet (4 mg total) by mouth every eight (8) hours as needed for nausea., Disp: , Rfl:     oxyCODONE (OXYCONTIN) 20 mg TR12 12 hr crush resistant ER/CR tablet, Take 1 tablet (20 mg total) by mouth every eight (8) hours for 28 days., Disp: 84 tablet, Rfl: 0    oxyCODONE (ROXICODONE) 10 mg immediate release tablet, Take 1 tablet (10 mg total) by mouth every four (4) hours as needed for pain., Disp: 120 tablet, Rfl: 0    PONATinib (ICLUSIG) 30 mg tablet, Take 1 tablet (30 mg total) by mouth daily. Swallow tablets whole. Do not crush, break, cut or chew tablets., Disp: 30 tablet, Rfl: 5    spironolactone (ALDACTONE) 25 MG tablet, TAKE 1 TABLET (25 MG TOTAL) BY MOUTH DAILY., Disp: 90 tablet, Rfl: 3    sulfamethoxazole-trimethoprim (BACTRIM DS) 800-160 mg per tablet, Take 1 tablet (160 mg of trimethoprim total) by mouth two (2) times a day. TAKE 1 TABLET BY MOUTH 2 TIMES A DAY ON SATURDAY, SUNDAY. FOR PROPHYLAXIS, Disp: 48 tablet, Rfl: 3    syringe with needle, safety (BD ECLIPSE LUER-LOK) 3 mL 22 gauge x 1 1/2 Syrg, To be used with TC injections, Disp: 50 each, Rfl: 0    testosterone cypionate (DEPOTESTOTERONE CYPIONATE) 200 mg/mL injection, Inject 0.5 mL (100 mg total) into the muscle once a week. Single Use Vials, Disp: 4 mL, Rfl: 5    tiotropium-olodaterol (STIOLTO RESPIMAT) 2.5-2.5 mcg/actuation Mist, Inhale 2 puffs daily., Disp: 4 g, Rfl: 3    tiotropium-olodaterol (STIOLTO RESPIMAT) 2.5-2.5 mcg/actuation Mist, Inhale 2 puffs daily., Disp: 4 g, Rfl: 0    tiotropium-olodaterol (STIOLTO RESPIMAT) 2.5-2.5 mcg/actuation Mist, Inhale 2 puffs daily., Disp: 4 g, Rfl: 0    valACYclovir (VALTREX) 500 MG tablet, Take 1 tablet (500 mg total) by mouth daily., Disp: 90 tablet, Rfl: 3    ALLERGIES:   Bupropion hcl, Cefepime, Ceftaroline fosamil, Dapsone, Onion, Vancomycin analogues, Bismuth subsalicylate, Privigen [immun glob g(igg)-pro-iga 0-50], Furosemide, and Gammagard    SOCIAL HISTORY:   Social History     Tobacco Use    Smoking status: Every Day     Current packs/day: 2.00     Types: Cigarettes     Passive exposure: Current    Smokeless tobacco: Former     Types: Chew   Substance Use Topics    Alcohol use: Not Currently     Comment: Rarely       FAMILY HISTORY:  Family History   Problem Relation Age of Onset    Cancer Mother     No Known Problems Father     No Known Problems Sister     No Known Problems Brother     No Known Problems Maternal Aunt     No Known Problems Maternal Uncle     No Known Problems Paternal Aunt     No Known Problems Paternal Uncle     No Known Problems Maternal Grandmother     Cancer Maternal Grandfather     No Known Problems Paternal Grandmother     No Known Problems Paternal Grandfather     Melanoma Neg Hx     Basal  cell carcinoma Neg Hx     Squamous cell carcinoma Neg Hx         Review of Systems     A review of systems was performed and relevant portions were as noted above in HPI     Physical Exam     VITAL SIGNS:    BP 146/90  - Pulse 84  - Temp 36.6 ??C (97.9 ??F) (Oral)  - Resp 12  - Wt (!) 118.4 kg (261 lb 0.4 oz)  - SpO2 91%  - BMI 34.44 kg/m??     Constitutional:   Alert and oriented.  Uncomfortable appearing.  Head:   Normocephalic and atraumatic  Eyes:   Conjunctivae are normal, EOMI, PERRL  ENT:   No notable congestion, Mucous membranes moist, External ears normal, no notable stridor  Cardiovascular:   Rate as vitals above. Appears warm and well perfused  Respiratory:   Normal respiratory effort.  Decreased breath sounds bilaterally.  Gastrointestinal:   Soft, non-distended, and nontender.   Genitourinary:   Deferred  Musculoskeletal:    Normal range of motion in all extremities. No tenderness or edema noted in B/L lower extremities  Neurologic:   No gross focal neurologic deficits beyond baseline are appreciated.  Skin:   Skin is warm, dry and intact.       Radiology     CTA Chest W Contrast   Final Result      No pulmonary embolism or other acute findings.         XR Chest Portable   Final Result      No acute abnormalities.            ED POCUS    (Results Pending)       Labs     Labs Reviewed   RESPIRATORY PATHOGEN PANEL - Abnormal; Notable for the following components:       Result Value    Parainfluenza 3 Detected (*)     All other components within normal limits    Narrative:     This result was obtained using the FDA-cleared BioFire Respiratory 2.1 Panel. Performance characteristics have been established and verified by the Clinical Molecular Microbiology Laboratory, [facility]. This assay does not distinguish between rhinovirus and enterovirus. Lower respiratory specimens will not be tested for Bordetella pertussis/parapertussis. For nasopharyngeal swabs, cross-reactivity may occur between B. pertussis and non-pertussis Bordetella species. All positive B. pertussis results will be automatically confirmed using our in-house PCR assay.   COMPREHENSIVE METABOLIC PANEL - Abnormal; Notable for the following components:    Creatinine 1.71 (*)     eGFR CKD-EPI (2021) Male 75 (*)     All other components within normal limits   CBC W/ AUTO DIFF - Abnormal; Notable for the following components:    RBC 5.88 (*)     HGB 16.9 (*)     HCT 48.3 (*)     RDW 15.8 (*)     All other components within normal limits   URINALYSIS WITH MICROSCOPY WITH CULTURE REFLEX PERFORMABLE - Abnormal; Notable for the following components:    Specific Gravity, UA 1.050 (*)     Protein, UA 200 mg/dL (*)     Mucus, UA Rare (*)     All other components within normal limits   INFLUENZA/RSV/COVID PCR - Normal    Narrative:     This test was performed using the Cepheid Xpert Xpress CoV-2/Flu/RSV plus assay, which has been validated by the CLIA-certified, CAP-inspected [facility]. FDA has granted Emergency Use Authorization for this test. Negative results do not preclude infection and should be interpreted along with clinical observations, patient history, and epidemiological information. Information for providers and patients can be found here: [email or web address]   PRO-BNP - Normal    Narrative:     Effective 12/12/22, NT-proBNP replaces BNP, results are not interchangeable. Values less than 300 pg/mL strongly rule out the presence of acute heart failure.   MAGNESIUM - Normal   PHOSPHORUS - Normal   BLOOD CULTURE   BLOOD CULTURE   CBC W/ DIFFERENTIAL    Narrative:     The following orders were created for panel order CBC w/ Differential.                  Procedure                               Abnormality         Status                                     ---------                               -----------         ------                                     CBC w/ Differential[[id number]]         Abnormal            Final result                                                 Please view results for these tests on the individual orders.   URINALYSIS WITH MICROSCOPY WITH CULTURE REFLEX    Narrative:     The following orders were created for panel order Urinalysis with Microscopy with Culture Reflex (Clean Catch).                  Procedure Abnormality         Status                                     ---------                               -----------         ------                                     Urinalysis with Microsc.Aaron AasAaron Aas[[id number]]  Abnormal            Final result  Please view results for these tests on the individual orders.         Pertinent labs & imaging results that were available during my care of the patient were reviewed by me and considered in my medical decision making (see chart for details).    Please note- This chart has been created using [employer]. Chart creation errors have been sought, but may not always be located and such creation errors, especially pronoun confusion, do NOT reflect on the standard of medical care.       Marilyne Shu, MD  Resident  09/08/23 [phone]

## 2023-09-07 NOTE — Unmapped (Signed)
 Pt dose increased, as per Dr Kelsey Patricia suggestion, the Oxycontin 20 mg FROM BID to TID, beginning on 5/19, from the existing supply he had at home (filled on 08/15/23).    New Rx has been sent to his pharm fto reflect new TID dosing, pharm can do a partial supply today and then will need a new Rx in future.  RN spoke to pt and asked he call him pharm to ensure he can pick up the partial supply today

## 2023-09-07 NOTE — Unmapped (Signed)
 Here for SOB, increased pain, lightheadedness. Pt on chemo for ALL.

## 2023-09-08 MED ADMIN — HYDROmorphone (PF) (DILAUDID) injection 2 mg: 2 mg | INTRAVENOUS | Stop: 2023-09-07

## 2023-09-08 MED ADMIN — HYDROmorphone (PF) (DILAUDID) injection 2 mg: 2 mg | INTRAVENOUS | @ 03:00:00 | Stop: 2023-09-07

## 2023-09-08 MED ADMIN — iohexol (OMNIPAQUE) 350 mg iodine/mL solution 75 mL: 75 mL | INTRAVENOUS | @ 03:00:00 | Stop: 2023-09-07

## 2023-09-08 NOTE — Unmapped (Signed)
 Received sign out from previous provider.    Patient Summary: Adam Keith is a 43 y.o. 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 Patient endorses congestion, SOB, fatigue, and dizziness, decreased activity  tolerance. Spoke with oncology over the phone today who recommended coming to the ED for evaluation.  Patient reports he was taken off his chemotherapy 16 days ago to see how he would tolerate.  For the past 3 days, he has been feeling shortness of breath at rest and with exertion.  He has had a nonproductive cough.  He has also been feeling lightheaded and presyncopal.  Endorses fevers.  Denies chest pain, nausea, vomiting.   Action List:   Pending CT imaging  Imaging negative.  Spoke to patient.  He is appealing.  He will be discharged.  Strict return precautions provided.    Updates  ED Course as of 09/08/23 0039   Thu Sep 07, 2023   2306 Hx ALL, on 2wk chemo break. SOB x3 days. +Parainfluenza. If nothing else, can DC   Fri Sep 08, 2023   0036 CTA Chest W Contrast  IMPRESSION:     No pulmonary embolism or other acute findings.

## 2023-09-11 NOTE — Unmapped (Signed)
 Adam Keith 's ICLUSIG 30 mg tablet (PONATinib) shipment will be sent out as a result of credit card ending in [phone] declined     I have reached out to the patient  at [phone] and communicated the delivery change. We will reschedule the medication for the delivery date that the patient agreed upon.  We have confirmed the delivery date as 09/12/23, via ups.

## 2023-09-14 ENCOUNTER — Encounter: Admit: 2023-09-14 | Discharge: 2023-09-15 | Payer: MEDICAID | Attending: Adult Health | Primary: Adult Health

## 2023-09-14 DIAGNOSIS — C9101 Acute lymphoblastic leukemia, in remission: Principal | ICD-10-CM

## 2023-09-14 NOTE — Unmapped (Signed)
 Spoke to pt prior to video visit. No refills needed. Pharmacy confirmed.

## 2023-09-14 NOTE — Unmapped (Signed)
 Continue the ponatinib 30 mg daily.  Please have a lab check ~11/06/23 and confirm they collect the BCR/ABL level.  We will have a virtual visit the following week. If the BCR/ABL is undetectable, we will plan to reduce the dose to 15 mg daily.

## 2023-09-14 NOTE — Unmapped (Unsigned)
 [facility]    Patient Name: Adam Keith  Patient Age: 43 y.o.  Encounter Date: 09/14/2023    Primary Care Provider:  Epifanio Alm Dover, MD    Referring Physician:  Iva Alyce Debby ELNITA  [address]  [id number]  [place],  [place] [address]    Cancer Diagnosis: Ph+ ALL; Initial Dx 01/2020  Cancer status: p210 0.007% on 09/01/23   Treatment Regimen: Third Line, ponatinib monotherapy since 01/2021  Treatment Goal: Curative  Comorbidities: COPD, OSA, DVT/PE, tobacco use, hypogonadism  Transplant: Discussed, but did not want to proceed    Assessment:  A 43 y.o. 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 The course was complicated by septic shock, candida krusei fungemia, MRSA bacteremia with septic emboli c/b acute renal failure and respiratory distress requiring dialysis and intubation.  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  He subsequently had difficulty tolerating single agent dasatinib (as a bridge to planned ponatinib-blinatumomab--had fluid retention and pleural effusion).  He then completed 4 cycles of ponatinib-blinatumomab, initiated for treatment of MRD in the setting of poor tolerance of standard therapy.      He is currently on ponatinib 30 mg monotherapy (on monotherapy since 01/2021).  The course has been complicated by chronic pain, weakness, fatigue that are multifactorial. He continues to follow with Palliative Care, ICID, and pulmonology.  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 Recently tried 2 week break from ponatinib. Symptoms initially better but then felt worse (at the end of 2 weeks). BCR/ABL detectable again (0.007%). He would like to restart at 30 mg but then try decreasing the dose to 15 mg once undetectable (to limit side effects). This is a reasonable plan  - ponatinib 30 mg daily restarted 11/06/23  - plan repeat labs in 2 months  - virtual visit 1 week later to discuss results  - ASA not needed because he is on apixaban.     Elevated hemoglobin  Elevated hemoglobin likely due to testosterone therapy and COPD/tobacco use. Now receiving therapeutic phlebotomy  - continue phlebotomy (plan in place by urology team)    Hypogonadism  Hypogonadism managed with testosterone therapy.   - testosterone therapy per urology    Chronic back pain  Chronic back pain managed with fentanyl patches and oxycodone. Current regimen provides temporary relief. Exploring non-pharmacological options.  - Continue current pain management regimen with fentanyl patches and oxycodone.  - Explore non-pharmacological options such as different footwear and foam padding for concrete surfaces.    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   - No plan for further IVIG given several reactions   - Rec Flu and Covid vaccines     Hx DRESS: followed by Dr. 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 Now s/p dialysis  - stable Cr appears to have developed CKD due to incompletely healed acute injury.  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 CT chest 02/06/22 with residual subacute thrombus.   - Indefinite anticoagulation with apixaban (  in addition to asa given ponatinib)     Vitamin D Deficiency: was low 07/2022.   - started supplementation 07/2022.   - He has not noticed any change in symptoms with it. Will recheck level in 1 month.     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 Arlander and Dr. Fleeta Abbot and Dr. Cary following patient.    - long term doxycycline with goal of decolonization  **Hx Candidemia during induction - now s/p Cresemba  - followed by Dr. Arlander  **Hx Covid-19 x 4 documented   - possible long-Covid symptoms    Symptom management: bowel regimen as listed in medication list, analgesics as listed in medication list or symptoms reviewed and due to acceptable control, no changes made  **Peripheral neuropathy, sensory and motor  - Grade 1-2 monitor  **Chronic Back Pain, complicated by hospital stay and procedures  - Followed by Dr. Lavin, Palliative Care  **Depression/anxiety: following with [name], [facility]  - management per psychiatry     Venous access: I recommend the following change for venous access : PICC line removal      Supportive Care Recommendations:  We recommend based on the patient???s underlying diagnosis and treatment history the following supportive care:    1. Antimicrobial prophylaxis:   - Primary management per ICID  - On daily doxy for recurrent MRSA pna    - Viral - valacyclovir 500mg  daily  - Bacterial - levofloxacin 500 mg if ANC <0.5  - Fungal - previously on treatment isavuconazole - off now  - PJP - On Bactrim DS BID Sat/Sun  - recommend Annual flu shot - declines thus far  - recommend updated Covid vaccine - declines    2. Blood product support:  I recommend the following intervals for laboratory monitoring to determine transfusion needs and monitor for hematologic effects of therapy and underlying disease: no routine monitoring outside of clinic follow-up    Leukoreduced blood products are required.  Irradiated blood products are preferred, but in case of urgent transfusion needs non-irradiated blood products may be used:     -  RBC transfusion threshold: transfuse 2 units for Hgb < 8 g/dL.  - Platelet transfusion threshold: transfuse 1 unit of platelets for platelet count < 10, or for bleeding or need for invasive procedure.    3. Hematopoietic growth factor support: none    Care coordination:   - as detailed above    Alyce Shaggy, AGPCNP-BC  Nurse Practitioner  Hematology/Oncology  [facility]      Toribio Bunker, MD was available  Leukemia Program  Division of Hematology  [facility]     Nurse Navigator (non-clinical trial patients): Irving Margo, RN       Tel. [phone]       Fax. [phone]  Toll-free appointments: [phone]  Scheduling assistance: [phone]  After hours/weekends: [phone] (ask for adult hematology/oncology on-call)      History of Present Illness:  We had the pleasure of seeing Adam Keith in the Leukemia Clinic at the [facility]  on 09/14/2023.  He is a 43 y.o. male with  Ph+ B-ALL .  He is currently on ponatinib, a third-generation TKI, and  has been in deep remission since July, with BCR-ABL levels consistently low.     Interval hx:     Since his last visit he trialed stopping ponatinib. He felt okay for the 2 weeks but then started feeling worse. Around this time he also developed parainfluenza and felt quite sick. He feels that the withdrawal symptoms from ponatinib made the URI symptoms much worse. He restarted ponatinib 6/4. He is starting to feel better. URI symptoms are lingering but significantly better. The increased pain he experienced with the infection/withdrawal are diminished back to baseline chronic pain.          His oncologic history is as follows:    Hematology/Oncology History Overview Note   Referring/Local Oncologist: None    Diagnosis:Ph+ ALL    Genetics:    Karyotype/FISH:Abnormal Karyotype: 46,XY,t(9;22)(q34;q11.2)[1]/45,XY,der(7;9)(q10;q10)t(9;22)(q34;q11.2),der(22)t(9;22)[11]/46,sdl,+der(22)t(9;22)[5]/46,XY[3]     Abnormal FISH: A BCR/ABL1 interphase FISH assay shows an abnormal signal pattern in 97% of the 100 cells scored. 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  The findings support a diagnosis of leukemia and have implications for targeted therapy and for monitoring residual disease.      Molecular Genetics:  BCR-ABL1 p210 transcripts were detected at a level of 46.479 IS% ratio in bone marrow.  BCR-ABL1 p190 transcripts were detected at a level of 4 in 100,000 cells in bone marrow.    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      01/23/2020 Initial Diagnosis    Acute lymphoblastic leukemia (ALL) not having achieved remission (CMS-HCC)     01/24/2020 - 04/02/2020 Chemotherapy    IP/OP LEUKEMIA GRAAPH-2005 + RITUXIMAB < 60 YO  rituximab hypercvad (odd and even course)     02/24/2020 Remission    CR with PCR MRD+; marrow showing 70% blasts with <1% blasts, negative flow MRD, BCR-ABL p210 PCR 0.197%; CNS negative for blasts     03/09/2020 Progression    CSF - rare blast ID'ed - IT chemo given    03/13/20 - IT chemo, CSF negative     04/16/2020 Biopsy    BM Bx with 40-50% cellularity, <1% blasts, MRD flow cytometry negative, BCR-ABL p210 transcripts 0.036%     05/28/2020 - 05/28/2020 Chemotherapy    OP AML - CNS THERAPY (INTRATHECAL CYTARABINE, INTRATHECAL METHOTREXATE, OR INTRATHECAL TRIPLE)  Select one of the following: cytarabine IT 100 mg with hydrocortisone 50 mg, cytarabine IT 40 mg with methotrexate 15 mg with hydrocortisone 50 mg, OR methotrexate IT 12 mg with hydrocortisone 50 mg     06/19/2020 -  Chemotherapy    IP/OP LEUKEMIA BLINATUMOMAB 7-DAY INFUSION (MINIMAL RESIDUAL DISEASE; WT >= 22 KG) (HOME INFUSION)      Cycles 1*-4: Blinatumomab 28 mcg/day Days 1-28 of 6-week cycle.  *Given in the inpatient setting on Days 1-3 on Cycle 1 and Days 1-2 on Cycle 2, while other treatment days are given in the outpatient setting.    Cycle 1 MRD Dosing     07/18/2020 Adverse Reaction    Hospitalization: fevers. Pneumonia     07/23/2020 Biopsy    Diagnosis  Bone marrow, right iliac, aspiration and biopsy  -   Normocellular bone marrow (50%) with trilineage hematopoiesis and 1% blasts by manual aspirate differential  -   Flow cytometry MRD analysis reveals  no definitive immunophenotypic evidence of residual B lymphoblastic leukemia   - BCR-ABL p210 0.006%         08/10/2020 -  Chemotherapy    Cycle 2 blinatumomab-ponatinib  Ponatinib 30mg     1 IT per cycle     09/21/2020 Adverse Reaction    Covid-19, symptomatic but not requiring hospitalization.     10/06/2020 -  Chemotherapy    Cycle 3 blinatumomab-ponatinib  Ponatinib 30mg        11/01/2020 Adverse Reaction    Hospitalization: MRSA Pneumonia requiring intubation    Blinatumomab stopped.  Ponatinib held until he stabilized.       12/14/2020 -  Chemotherapy    Cycle 4 blinatumomab 28 mcg/day days 1-28, ponatinib 30 mg per day IT triple therapy day 29     01/13/2021 -  Chemotherapy    Ponatinib 30mg  daily  - due to due low level BCR-ABL     03/06/2021 Adverse Reaction    Hospitalization - ICU on ventilator - influenza and MRSA/klebsiella PNA     06/13/2022 -  Chemotherapy    OP LEUKEMIA VINCRISTINE  vinCRIStine 2 mg IV on day 1     ALL (acute lymphoblastic leukemia)      06/11/2020 -  Chemotherapy    IP/OP LEUKEMIA BLINATUMOMAB 7-DAY INFUSION (MINIMAL RESIDUAL DISEASE; WT >= 22 KG) (HOME INFUSION)  Cycles 1*-4: Blinatumomab 28 mcg/day Days 1-28 of 6-week cycle.  *Given in the inpatient setting on Days 1-3 on Cycle 1 and Days 1-2 on Cycle 2, while other treatment days are given in the outpatient setting.     09/21/2020 Initial Diagnosis    ALL (acute lymphoblastic leukemia) (CMS-HCC)     06/13/2022 -  Chemotherapy    OP LEUKEMIA VINCRISTINE  vinCRIStine 2 mg IV on day 1     Acute lymphoblastic leukemia in remission 06/11/2020 -  Chemotherapy    IP/OP LEUKEMIA BLINATUMOMAB 7-DAY INFUSION (MINIMAL RESIDUAL DISEASE; WT >= 22 KG) (HOME INFUSION)  Cycles 1*-4: Blinatumomab 28 mcg/day Days 1-28 of 6-week cycle.  *Given in the inpatient setting on Days 1-3 on Cycle 1 and Days 1-2 on Cycle 2, while other treatment days are given in the outpatient setting.     11/09/2020 Initial Diagnosis    Acute lymphoblastic leukemia in remission (CMS-HCC)     06/13/2022 -  Chemotherapy    OP LEUKEMIA VINCRISTINE  vinCRIStine 2 mg IV on day 1           Past Medical, Surgical and Family History were reviewed and pertinent updates were made in the Electronic Medical Record      ECOG Performance Status: 1    Medications:      Current Outpatient Medications   Medication Sig Dispense Refill    albuterol HFA 90 mcg/actuation inhaler Inhale 2 puffs every six (6) hours as needed for wheezing. 18 g 0    amLODIPine (NORVASC) 10 MG tablet Take 1 tablet (10 mg total) by mouth daily. 30 tablet 0    arm brace (WRIST BRACE) Misc 1 Piece by Miscellaneous route as needed. left ulnar wrist brace 1 each 0    carvediloL (COREG) 12.5 MG tablet Take 1 tablet (12.5 mg total) by mouth Two (2) times a day. 180 tablet 1    cholecalciferol, vitamin D3-250 mcg, 10,000 unit,, 250 mcg (10,000 unit) capsule Take 1 capsule (250 mcg total) by mouth daily. 120 capsule 3    docusate sodium (COLACE) 100 MG capsule Take 1 capsule (100 mg total) by  mouth daily.      doxycycline (VIBRA-TABS) 100 MG tablet Take 1 tablet (100 mg total) by mouth two (2) times a day. 180 tablet 3    ELIQUIS 5 mg Tab TAKE 1 TABLET BY MOUTH TWO TIMES A DAY. 60 tablet 6    hydroCHLOROthiazide 12.5 MG tablet Take 1 tablet (12.5 mg total) by mouth daily. 30 tablet 11    naloxone (NARCAN) 4 mg nasal spray One spray in either nostril once for known/suspected opioid overdose. May repeat every 2-3 minutes in alternating nostril til EMS arrives 2 each 0    nortriptyline (PAMELOR) 50 MG capsule TAKE 2 CAPSULES BY MOUTH EVERY DAY AT NIGHT 180 capsule 3    omeprazole (PRILOSEC) 40 MG capsule Take 1 capsule (40 mg total) by mouth every morning.      ondansetron (ZOFRAN-ODT) 4 MG disintegrating tablet Take 1 tablet (4 mg total) by mouth every eight (8) hours as needed for nausea.      oxyCODONE (OXYCONTIN) 20 mg TR12 12 hr crush resistant ER/CR tablet Take 1 tablet (20 mg total) by mouth every eight (8) hours for 28 days. 84 tablet 0    oxyCODONE (ROXICODONE) 10 mg immediate release tablet Take 1 tablet (10 mg total) by mouth every four (4) hours as needed for pain. 120 tablet 0    PONATinib (ICLUSIG) 30 mg tablet Take 1 tablet (30 mg total) by mouth daily. Swallow tablets whole. Do not crush, break, cut or chew tablets. 30 tablet 5    spironolactone (ALDACTONE) 25 MG tablet TAKE 1 TABLET (25 MG TOTAL) BY MOUTH DAILY. 90 tablet 3    sulfamethoxazole-trimethoprim (BACTRIM DS) 800-160 mg per tablet Take 1 tablet (160 mg of trimethoprim total) by mouth two (2) times a day. TAKE 1 TABLET BY MOUTH 2 TIMES A DAY ON SATURDAY, SUNDAY. FOR PROPHYLAXIS 48 tablet 3    syringe with needle, safety (BD ECLIPSE LUER-LOK) 3 mL 22 gauge x 1 1/2 Syrg To be used with TC injections 50 each 0    testosterone cypionate (DEPOTESTOTERONE CYPIONATE) 200 mg/mL injection Inject 0.5 mL (100 mg total) into the muscle once a week. Single Use Vials 4 mL 5    tiotropium-olodaterol (STIOLTO RESPIMAT) 2.5-2.5 mcg/actuation Mist Inhale 2 puffs daily. 4 g 3    tiotropium-olodaterol (STIOLTO RESPIMAT) 2.5-2.5 mcg/actuation Mist Inhale 2 puffs daily. 4 g 0    tiotropium-olodaterol (STIOLTO RESPIMAT) 2.5-2.5 mcg/actuation Mist Inhale 2 puffs daily. 4 g 0    valACYclovir (VALTREX) 500 MG tablet Take 1 tablet (500 mg total) by mouth daily. 90 tablet 3     No current facility-administered medications for this visit.       Vital Signs:  There were no vitals filed for this visit.    GENERAL: Well-appearing white man. NAD. Driving.    HEENT: Pupils equal, round. EOMI. HEART: Normal color, not excessive pallor. Not ashen.   CHEST/LUNG: Normal work of breathing. No dyspnea with conversation.   EXTREMITIES: Not viewed.   NEURO EXAM: Grossly intact.         MDM:  1 chronic life threatening illness  Drug therapy requiring intense monitoring for toxicity       The patient reports they are physically located in [place]  and is currently: not at home. I conducted a audio/video visit. I spent  66m 34s on the video call with the patient. I spent an additional 10 minutes on pre- and post-visit activities on the date of service . in [place]   and is currently: not at home. I conducted a audio/video visit. I spent  0s on the video call with the patient. I spent an additional 10 minutes on pre- and post-visit activities on the date of service .

## 2023-09-15 DIAGNOSIS — C9101 Acute lymphoblastic leukemia, in remission: Principal | ICD-10-CM

## 2023-09-18 ENCOUNTER — Inpatient Hospital Stay: Admit: 2023-09-18 | Discharge: 2023-09-19 | Payer: MEDICAID

## 2023-09-18 ENCOUNTER — Ambulatory Visit: Admit: 2023-09-18 | Discharge: 2023-09-19 | Payer: MEDICAID | Attending: Vascular Surgery | Primary: Vascular Surgery

## 2023-09-18 DIAGNOSIS — I701 Atherosclerosis of renal artery: Principal | ICD-10-CM

## 2023-09-18 NOTE — Unmapped (Addendum)
 Your surgery is scheduled for _________ at [address] [place].    SABRA You will receive a call the day before your surgery with your exact arrival time.  If you need PreCare, your anesthesia team, will also contact you prior to your procedure.  You may be asked to go to our [facility] clinic for more labs or testing.      . Do not eat or drink anything 8 hours before surgery.      . If you are taking blood thinners (Eliquis, Coumadin, Xarelto, Pradaxa)  we will discuss when to stop these medications.  Stop taking your blood thinner (ELIQUIS)  _2 days prior on ________.  You will receive instructions on when to start taking it again after surgery.      . Do not take medications for diabetes the day of surgery.      Stop any medications containing ibuprofen 7 days prior to your surgery date.    Call the Vascular Surgery clinic [phone] (Monday-Friday 8am-3:00 pm) and ask to speak with a nurse if you have any questions.  If you have signs/symptoms of infection (fever, chills, night sweats, large amounts of drainage, severe pain) go to your closest Emergency Room.     If it is after hours or on the weekend, please call the hospital operator [phone] and ask to speak with the Vascular Surgeon on call.

## 2023-09-18 NOTE — Unmapped (Signed)
 VASCULAR SURGERY [place]    Patient Name: Adam Keith  Encounter Date: 09/18/2023  Referring provider: Hardie  Surgeon: Benrashid    ASSESSMENT     43 y.o. male with history of ALL, DVT, PE, tobacco abuse, and leg pain.  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  He is on 4 antihypertensive medications, including diuretics, and has a reduced GFR. Duplex today with >60% stenosis and significant velocity elevations.  Recommend left renal angiogram with stenting. Procedure, as well as risks and benefits were reviewed in detail with patient and his wife; he is agreeable to proceed with intervention.     He has bilateral leg pain and weakness but no apparent claudication symptoms and no significant stenosis on CTA.  He has multiphasic DP and PT signals bilaterally, so low suspicion that this is d/t PAD.    PLAN     Schedule for L renal angiogram with stent.  Follow up 1 month post procedure with duplex.   Hold Eliquis 2 days prior to procedure.  Medical optimization  Smoking cessation: encouraged smoking reduction/cessation; 1.5 ppd x 28 years  Anticoagulation therapy: Eliquis 5 mg    HISTORY OF PRESENT ILLNESS      Adam Keith is a 43 y.o. male who presents for evaluation of renal artery stenosis.    Stenosis identified incidentally on CTA to work up his leg pain and weakness.  He is on 4 BP medications. Current smoker (1.5 ppd).  He reports leg pain in his hips, thighs, and knees, R>L.  He has a family hx CVA but no personal history of stroke like symptoms.  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  He is on ponatinib for ALL in remission.    ROS: The balance of 10 systems reviewed is negative except as noted in the HPI     RX/ ALLERGIES/ MED HX/SURG HX/ SOC HX/FAM HX: Reviewed & updated in Epic    Current Outpatient Medications   Medication Instructions    albuterol HFA 90 mcg/actuation inhaler 2 puffs, Inhalation, Every 6 hours PRN amlodipine (NORVASC) 10 mg, Oral, Daily (standard)    arm brace (WRIST BRACE) Misc 1 Piece, Miscellaneous, As needed during procedure (one-step-med), left ulnar wrist brace    carvedilol (COREG) 12.5 mg, Oral, 2 times a day (standard)    cholecalciferol (vitamin D3-250 mcg (10,000 unit)) 250 mcg, Oral, Daily (standard)    docusate sodium (COLACE) 100 mg, Daily (standard)    doxycycline (VIBRA-TABS) 100 mg, Oral, 2 times a day (standard)    ELIQUIS 5 mg, Oral, 2 times a day (standard)    hydroCHLOROthiazide 12.5 mg, Oral, Daily (standard)    ICLUSIG 30 mg, Oral, Daily (standard), Swallow tablets whole. Do not crush, break, cut or chew tablets.    naloxone (NARCAN) 4 mg nasal spray One spray in either nostril once for known/suspected opioid overdose. May repeat every 2-3 minutes in alternating nostril til EMS arrives    nortriptyline (PAMELOR) 50 MG capsule TAKE 2 CAPSULES BY MOUTH EVERY DAY AT NIGHT    omeprazole (PRILOSEC) 40 mg, Every morning    ondansetron (ZOFRAN-ODT) 4 mg, Every 8 hours PRN    oxyCODONE (OXYCONTIN) 20 mg, Oral, Every 8 hours    oxyCODONE (ROXICODONE) 10 mg, Oral, Every 4 hours PRN    spironolactone (ALDACTONE) 25 mg, Oral, Daily (standard)    sulfamethoxazole-trimethoprim (BACTRIM DS) 800-160 mg per tablet 160 mg of  trimethoprim, Oral, 2 times a day (standard), TAKE 1 TABLET BY MOUTH 2 TIMES A DAY ON SATURDAY, SUNDAY. FOR PROPHYLAXIS    syringe with needle, safety (BD ECLIPSE LUER-LOK) 3 mL 22 gauge x 1 1/2 Syrg To be used with TC injections    testosterone cypionate (DEPOTESTOTERONE CYPIONATE) 100 mg, Intramuscular, Weekly, Single Use Vials    tiotropium-olodaterol (STIOLTO RESPIMAT) 2.5-2.5 mcg/actuation Mist 2 puffs, Inhalation, Daily (standard)    tiotropium-olodaterol (STIOLTO RESPIMAT) 2.5-2.5 mcg/actuation Mist 2 puffs, Inhalation, Daily (standard)    tiotropium-olodaterol (STIOLTO RESPIMAT) 2.5-2.5 mcg/actuation Mist 2 puffs, Inhalation, Daily (standard)    valACYclovir (VALTREX) 500 MG tablet Take 1 tablet (500 mg total) by mouth daily.        PHYSICAL EXAM      Vitals:    09/18/23 0919   BP: 132/86   Pulse: 72   Temp: 36.5 ??C (97.7 ??F)     General: well appearing, no acute distress   Head: normocephalic, atraumatic  Neck: Supple, no carotid bruit  Cardiac: RRR   Lungs: Easy work of breathing, CTA Bilaterally  Abdomen: Soft, non-distended, non tender, without mass  MSK: negative joint tenderness, normal gait and ROM  Skin: intact, good turgor, no sparse hair growth pattern, no onychomycosis   Neuro: AAO x 3, appropriate to questions  Extremities: Upper extremities warm and perfused. Bilateral lower extremities negative for edema, ulceration or digital gangrene.   Pulses: Bilateral radial pulses palpable. Feet warm, multiphasic doppler signals DP and AT b/l.        DIAGNOSTICS      Images and reports reviewed independently.    Labs:  Lab Results   Component Value Date    CREATININE 1.71 (H) 09/07/2023    LDL 123 (H) 05/13/2020    J8R 4.8 10/18/2021       Summary of Findings  Right: No evidence of hemodynamically significant stenosis detected in the renal artery. Kidney size and resistive index are within normal limits. Doppler signal demonstrated presence of flow in the right renal vein.  Left: Doppler signal demonstrated presence of flow in the left renal vein. Kidney size and resistive index are within normal limits. Elevated LRA velocity and left RAR is suggestive of a >60% stenosis at the LRA origin.  Mesenteric:  Other:     Duplex Findings:     +---------------+-------+-------+  -Vessel         -PSV    -EDV    -  +---------------+-------+-------+  -Aorta at SMA   -54 cm/s-8 cm/s -  +---------------+-------+-------+  -Aorta at Celiac-71 cm/s-13 cm/s-  +---------------+-------+-------+  +---------------+--------+-------++--------------+--------+--------+  -Right Renal Art-PSV     -EDV    --Left Renal Art-PSV     -EDV -  +---------------+--------+-------++--------------+--------+--------+  -Origin         -155 cm/s-36 cm/s--Origin        -414 cm/s-174 cm/s-  +---------------+--------+-------++--------------+--------+--------+  -Proximal       -158 cm/s-38 cm/s--Proximal      -353 cm/s-195 cm/s-  +---------------+--------+-------++--------------+--------+--------+  -Mid            -61 cm/s -17 cm/s--Mid           -75 cm/s -20 cm/s -  +---------------+--------+-------++--------------+--------+--------+  -Distal         -39 cm/s -12 cm/s--Distal        -52 cm/s -18 cm/s -  +---------------+--------+-------++--------------+--------+--------+  -Hilum          -35 cm/s -11 cm/s--Hilum         -25  cm/s -11 cm/s -  +---------------+--------+-------++--------------+--------+--------+  +-------------------+--------+------------------+-------+  -Right Hilum RI     -0.69    -Left Hilum RI     -0.56   -  +-------------------+--------+------------------+-------+  -Right Hilum Time   -61 msec -Left Hilum Time   -83 msec-  +-------------------+--------+------------------+-------+  -Right Kidney Length-10.54 cm-Left Kidney Length-9.93 cm-  +-------------------+--------+------------------+-------+  -Right Kidney RI    -0.65    -Left Kidney RI    -0.52   -  +-------------------+--------+------------------+-------+  -Right RAR at celiac-2.23    -Left RAR at celiac-5.83   -  +-------------------+--------+------------------+-------+  -Right RAR at SMA   -2.93    -Left RAR at SMA   -7.67   -

## 2023-09-20 ENCOUNTER — Ambulatory Visit
Admit: 2023-09-20 | Payer: MEDICAID | Attending: Student in an Organized Health Care Education/Training Program | Primary: Student in an Organized Health Care Education/Training Program

## 2023-09-20 ENCOUNTER — Ambulatory Visit: Admit: 2023-09-20 | Payer: MEDICAID | Attending: Hematology & Oncology | Primary: Hematology & Oncology

## 2023-09-22 DIAGNOSIS — C9101 Acute lymphoblastic leukemia, in remission: Principal | ICD-10-CM

## 2023-09-26 ENCOUNTER — Encounter: Admit: 2023-09-26 | Discharge: 2023-09-27 | Payer: MEDICAID

## 2023-09-26 DIAGNOSIS — F4310 Post-traumatic stress disorder, unspecified: Principal | ICD-10-CM

## 2023-09-26 DIAGNOSIS — F419 Anxiety disorder, unspecified: Principal | ICD-10-CM

## 2023-09-26 DIAGNOSIS — F3340 Major depressive disorder, recurrent, in remission, unspecified: Principal | ICD-10-CM

## 2023-09-26 NOTE — Unmapped (Signed)
 Comprehensive Cancer Support Program (CCSP) - Psychiatry Outpatient Clinic   After Visit Summary    It was a pleasure to see you today in the [facility]s Comprehensive Cancer Support Program (CCSP). The CCSP is a multidisciplinary program dedicated to helping patients, caregivers, and families with cancer treatment, recovery and survivorship.      To schedule, cancel, or change your appointment:  Please call the [facility] schedulers at [phone], Monday through Friday 8AM - 5PM.  Someone will return your call within 24 hours.      If you have a question about your medicines or you need to contact your provider:  First, try sending a My Chart message to [name], PMHNP. If you are unable to do so, please call the CCSP program coordinator, [facility], at [phone].     For after hours urgent issues, you may call [phone] or call the I need to talk line at [phone] ([phone] anytime 24/7.    CCSP Patient and Family Resource Center: [phone].    CCSP Website:  [email or web address]    For prescription refills, please allow at least 24 hours (during business hours, M-F) for providers to call in refills to your pharmacy. We are generally unable to accommodate same-day requests for refills.     If you are taking any controlled substances (such as anxiety or sleep medications), you must use them as the directions say to use them. We generally do not provide early refills over the phone without clear reason, and it would be inappropriate to obtain the medications from other doctors. We routinely use the [place] controlled substance database to monitor prescription drug use.

## 2023-09-26 NOTE — Unmapped (Signed)
 [facility]  Psychiatry--Comprehensive Cancer Support Program   Established Patient E&M Service - Outpatient       Assessment:    Adam Keith presents for follow-up evaluation. He states that his depression is well-managed and his anxiety is mild to moderate in severity. Sleeping well at night. No medication changes needed. Continuing his nortriptyline  100 mg nightly. Will plan to reassess on 03/12/24.    Identifying Information:  Adam Keith is a 43 y.o. male with a history of PH+ B cell ALL, recurrent major depressive disorder, PTSD, anxiety, and insomnia. He has had a long struggle with intermittent depressive episodes with one suicide attempt at age 53.      Risk Assessment:  An assessment of suicide and violence risk factors was performed as part of this evaluation and is not significantly increased from the last visit.   While future psychiatric events cannot be accurately predicted, the patient does not currently require acute inpatient psychiatric care and does not currently meet [place]  involuntary commitment criteria.      Plan:    Problem 1: Major Depressive Disorder  Status of problem: chronic and stable  Interventions:   Continue nortriptyline  100 mg at bedtime.      Encouraged psychotherapy with local therapist     Problem 2: Anxiety/PTSD  Status of problem: chronic with mild exacerbation  Interventions:   See MDD above     Problem 3: Insomnia  Status of problem: improved  Interventions:   See nortriptyline  plan above      Psychotherapy provided:  No billable psychotherapy service provided.    Patient has been given this writer's contact information as well as the [facility] Psychiatry urgent line number. The patient has been instructed to call [phone] for emergencies.      Subjective:    Chief complaint:  Follow-up psychiatric evaluation for depression and anxiety    Interval History:   He experiences mild to moderate anxiety, rating it as 3 or 4 out of 10. Triggers such as smells or visual cues exacerbate his anxiety and PTSD. He reports a decrease in constant worrying and has come to terms with the inevitability of certain events.    His mood is stable with no worsening of depression after stopping mirtazapine . He experiences occasional 'bad days' once or twice a month, often when his wife is working or due to weather constraints. He continues to take nortriptyline  at bedtime.    His sleep has improved with ease of waking. On non-working days, he sleeps 10-11 hours, while on working days, he sleeps 7-8 hours. He requires a recovery day after a stretch of work.    Energy levels have improved, allowing for more activity. On particularly tired days, he rests indoors and plans active days when feeling better. No significant feelings of hopelessness or worthlessness are present.    Prior Psychiatric Medication Trials: bupropion (could not tolerate), duloxetine , mirtazapine  (given during recent hospitalization), lorazepam , clonazepam , methylphenidate  (jittery)     Psychiatric Review of Systems  Depressive Symptoms: denies, but states that he has a few bad days where he feels down each month  Manic Symptoms: N/A  Psychosis Symptoms: N/A  Anxiety Symptoms: excessive anxiety at times  Panic Symptoms: denies  PTSD Symptoms: denies  Sleep disturbance: denies      Objective:      Mental Status Exam:  Appearance:    Appears stated age, Well nourished, and Clean/Neat   Motor:   No abnormal movements   Speech/Language:  Normal rate, volume, tone, fluency   Mood:   Good   Affect:   Calm, Cooperative, and Euthymic   Thought process and Associations:   Logical, linear, clear, coherent, goal directed   Abnormal/psychotic thought content:     Denies SI, HI, self harm, delusions, obsessions, paranoid ideation, or ideas of reference   Perceptual disturbances:     Denies auditory and visual hallucinations, behavior not concerning for response to internal stimuli     Other:   Insight and judgment intact     I personally spent 25 minutes face-to-face and non-face-to-face in the care of this patient, which includes all pre, intra, and post visit time on the date of service.    Visit was completed by video (or phone) and the appropriate disclaimer has been included below.    The patient reports they are physically located in [place]  and is currently: at home. I conducted a audio/video visit. I spent 10 minutes on the video call with the patient. I spent an additional 15 minutes on pre- and post-visit activities on the date of service .        Delilah Mulgrew B Chadric Kimberley, PMHNP  09/26/2023

## 2023-09-27 DIAGNOSIS — C9101 Acute lymphoblastic leukemia, in remission: Principal | ICD-10-CM

## 2023-09-27 NOTE — Unmapped (Signed)
 I spoke with patient Adam Keith to confirm appointments on the following date(s):     8/11    [name]

## 2023-10-01 DIAGNOSIS — C9101 Acute lymphoblastic leukemia, in remission: Principal | ICD-10-CM

## 2023-10-01 NOTE — Unmapped (Signed)
 [facility] Pharm[facility] Refill Coordination Note    Haley Roza, DOB: May 23, 1980  Phone: [phone] (work)      All above HIPAA information was verified with patient.         09/30/2023     1:38 PM   Specialty Rx Medication Refill Questionnaire   Which Medications would you like refilled and shipped? Ponatnib   Please list all current allergies: See Mychart   Have you missed any doses in the last 30 days? No   Have you had any changes to your medication(s) since your last refill? No   How many days remaining of each medication do you have at home? Unsure   Have you experienced any side effects in the last 30 days? Yes   Please list the medication side effects below. (A pharmacist will reach out to you shortly to discuss your side effects. Note: your medications will not be shipped until we reach out to you). Skin Cancer   Please enter the full address (street address, city, state, zip code) where you would like your medication(s) to be delivered to. [address] [place] , [place] [address]   Please specify on which day you would like your medication(s) to arrive. Note: if you need your medication(s) within 3 days, please call the pharmacy to schedule your order at [phone]  10/10/2023   Has your insurance changed since your last refill? No   Would you like a pharmacist to call you to discuss your medication(s)? No   Do you require a signature for your package? (Note: if we are billing Medicare Part B or your order contains a controlled substance, we will require a signature) No   I have been provided my out of pocket cost for my medication and approve the pharmacy to charge the amount to my credit card on file. Yes         Completed refill call assessment today to schedule patient's medication shipment from the [facility] [phone]).  All relevant notes have been reviewed.       Confirmed patient received a Conservation officer, historic buildings and a Surveyor, mining with first shipment. The patient will receive a drug information handout for each medication shipped and additional FDA Medication Guides as required.         REFERRAL TO PHARMACIST     Referral to the pharmacist: Not needed      [facility]     Shipping address confirmed in Epic.     Delivery Scheduled: Yes, Expected medication delivery date: 10/10/23.     Medication will be delivered via UPS to the prescription address in Epic [place].    Breah Joa M Santer Torres   [place] [facility] Specialty Technician

## 2023-10-04 DIAGNOSIS — D751 Secondary polycythemia: Principal | ICD-10-CM

## 2023-10-05 NOTE — Unmapped (Signed)
 VIR pre rL enal angiogram prep call completed. Reviewed to register at 0740 on [facility] then [facility].  Informed of no show/late cancellation policy. NPO guidelines reviewed. Pt OK to take sips of clear liquids with all AM meds.  Pt aware of need for driver >44 years of age able to stay throughout procedure and recovery. Made aware of visitation policy.  Pt verbalized understanding. All questions answered.     Knows to hold eliquis  2 days prior.

## 2023-10-05 NOTE — Unmapped (Signed)
 Patient scheduled for left renal angiogram on 10/11/23.  Per Dr Rodrick, reviewed with patient that last dose of Eliquis  will be on 10/08/23.  Patient verbalized that he will not take Eliquis  after 10/08/23.

## 2023-10-08 DIAGNOSIS — C9101 Acute lymphoblastic leukemia, in remission: Principal | ICD-10-CM

## 2023-10-09 DIAGNOSIS — C9101 Acute lymphoblastic leukemia, in remission: Principal | ICD-10-CM

## 2023-10-09 MED FILL — ICLUSIG 30 MG TABLET: ORAL | 30 days supply | Qty: 30 | Fill #1

## 2023-10-09 NOTE — Unmapped (Signed)
 IMMUNOCOMPROMISED HOST INFECTIOUS DISEASE PROGRESS NOTE    The patient reports they are physically located in [place]  and is currently: at home. I conducted a audio/video visit. I spent  59m 17s on the video call with the patient. I spent an additional 15 minutes on pre- and post-visit activities on the date of service .         Assessment/Plan:     Adam Keith is a 43 y.o. 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 krusei fungemia  - Prior chemotherapy: [id number] induction with dasatinib  (vincristine, dexamethasone  and dasatinib ); C1D1 01/24/2020; difficulty tolerating single-agent dasatinib   - 01/11/21: Normocellular bone marrow (30% overall) with trilineage hematopoiesis and less than 1% blasts by manual aspirate differential; Flow cytometry MRD analysis reveals no definitive immunophenotypic evidence of residual B lymphoblastic leukemia; BCR-ABL p210 transcripts were detected at a level of 0.002 IS % ratio in bone marrow.  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 s/p mica+azole until 05/13/20  # Possible post-COVID-19 associated fungal infection 07/17/20 s/p isavuconazole until 12/03/20  # Recurrent nasal congestion/ L maxillary sinusitis in setting of L posterior and R anterior septal deviation, 10/11/21  # Influenza A, 05/04/21  # Otitis media with left mastoid effusion, 06/2022   # COVID-19 infection 05/28/2020, 09/21/2020, 10/18/2021, 05/31/22 (s/p molnupirovir)     Active infections:    # Recurrent, severe MRSA infections with intubation; on doxycycline  prophylaxis since 03/26/21  - 01/27/20 MRSA bacteremia + PNA + TV IE;   - 11/01/2020 MRSA RLL PNA c/b bacteremia/right empyema;   - 11/18/2020 RML MRSA pneumonia;   - 03/05/21 MRSA pneumonia after influenza infection requiring ICU admission/intubation  - 09/07/23 CTA chest no PE, Trachea and large airways are patent. Similar right upper lobe scarring and architectural distortion. Mild centrilobular emphysema.. No pleural effusion or pneumothorax.     Antimicrobial Intolerance/allergy  Cefepime  - DRESS/DiHS  Ceftaroline - DRESS/DiHS  Dapsone - possible agranulocytosis, per chart anaphylaxis  Vancomycin  - probable ototoxicity (in combination with furosemide )  Isavuconazole - elevated LFTs in the setting of  TKI  Linezolid  - peripheral neuropathy; previously tolerated tedizolid  Aztreonam +linezolid  - patient reported ear pain and worsening hearing (in the setting COVID-19)      RECOMMENDATIONS    Diagnosis  NA    Management  Continue doxycycline  100mg  bid until 5 days after surgery. He's ready to try discontinuing prophylaxis this summer since he has done well without pneumonia for several years. Avoid prolong sun exposure while on PO doxycycline  with appropriate sun skin care measures.   He will send me a MyChart message if any concern with breathing or congestion.     Antimicrobial prophylaxis required for hematological malignancy   Continue valacyclovir  500mg  daily  Continue tmp-smx 2 DS bid on S/S  Declined COVID 19 vaccine and flu vaccines previously. Reinforced early diagnosis and treatment of COVID-19 and influenza infection    Intensive toxicity monitoring for prescription antimicrobials   Monthly cbc and cmp    Follow up PRN          I personally spent 21 minutes face-to-face and non-face-to-face in the care of this patient, which includes all pre, intra, and post visit time on the date of service.  All documented time was specific to the E/M visit and does not include any procedures that may have been performed. Time spent in H&P, chart review, charting, and communicating with other providers.    Adam CHRISTELLA Marten, MD  [facility] Division of Infectious Diseases    Subjective     External record(s): Consultant note(s): psych note on 6/24 suggests that he is doing better; vascular notes indicate left renal art angio this week.    Independent historian(s): no independent historian required.       Interval History:   Working part-time.  Off steroids  Trial off chemo didn't go too well  Surgery tomorrow (sedation)  Plans to go back on testosterone  after that  Feeling pretty good overall  Still short of breath with exertion    Medications:    Current Outpatient Medications:     albuterol  HFA 90 mcg/actuation inhaler, Inhale 2 puffs every six (6) hours as needed for wheezing., Disp: 18 g, Rfl: 0    amLODIPine  (NORVASC ) 10 MG tablet, Take 1 tablet (10 mg total) by mouth daily., Disp: 30 tablet, Rfl: 0    arm brace (WRIST BRACE) Misc, 1 Piece by Miscellaneous route as needed. left ulnar wrist brace, Disp: 1 each, Rfl: 0    carvediloL  (COREG ) 12.5 MG tablet, Take 1 tablet (12.5 mg total) by mouth Two (2) times a day., Disp: 180 tablet, Rfl: 1    cholecalciferol , vitamin D3-250 mcg, 10,000 unit,, 250 mcg (10,000 unit) capsule, Take 1 capsule (250 mcg total) by mouth daily., Disp: 120 capsule, Rfl: 3    docusate sodium (COLACE) 100 MG capsule, Take 1 capsule (100 mg total) by mouth daily., Disp: , Rfl:     doxycycline  (VIBRA -TABS) 100 MG tablet, Take 1 tablet (100 mg total) by mouth two (2) times a day., Disp: 180 tablet, Rfl: 3    ELIQUIS  5 mg Tab, TAKE 1 TABLET BY MOUTH TWO TIMES A DAY., Disp: 60 tablet, Rfl: 6    hydroCHLOROthiazide  12.5 MG tablet, Take 1 tablet (12.5 mg total) by mouth daily., Disp: 30 tablet, Rfl: 11    naloxone  (NARCAN ) 4 mg nasal spray, One spray in either nostril once for known/suspected opioid overdose. May repeat every 2-3 minutes in alternating nostril til EMS arrives, Disp: 2 each, Rfl: 0    nortriptyline  (PAMELOR ) 50 MG capsule,  TAKE 2 CAPSULES BY MOUTH EVERY DAY AT NIGHT, Disp: 180 capsule, Rfl: 3    omeprazole (PRILOSEC) 40 MG capsule, Take 1 capsule (40 mg total) by mouth every morning., Disp: , Rfl:     ondansetron  (ZOFRAN -ODT) 4 MG disintegrating tablet, Take 1 tablet (4 mg total) by mouth every eight (8) hours as needed for nausea., Disp: , Rfl:     oxyCODONE  (ROXICODONE ) 10 mg immediate release tablet, Take 1 tablet (10 mg total) by mouth every four (4) hours as needed for pain., Disp: 120 tablet, Rfl: 0    PONATinib  (ICLUSIG ) 30 mg tablet, Take 1 tablet (30 mg total) by mouth daily. Swallow tablets whole. Do not crush, break, cut or chew tablets., Disp: 30 tablet, Rfl: 5    spironolactone  (ALDACTONE ) 25 MG tablet, TAKE 1 TABLET (25 MG TOTAL) BY MOUTH DAILY., Disp: 90 tablet, Rfl: 3    sulfamethoxazole -trimethoprim  (BACTRIM  DS) 800-160 mg per tablet, Take 1 tablet (160 mg of trimethoprim  total) by mouth two (2) times a day. TAKE 1 TABLET BY MOUTH 2 TIMES A DAY ON SATURDAY, SUNDAY. FOR PROPHYLAXIS, Disp: 48 tablet, Rfl: 3    syringe with needle, safety (BD ECLIPSE LUER-LOK) 3 mL 22 gauge x 1 1/2 Syrg, To be used with TC injections, Disp: 50 each, Rfl: 0    testosterone  cypionate (DEPOTESTOTERONE CYPIONATE) 200 mg/mL injection, Inject 0.5 mL (100 mg total) into the muscle once a week. Single Use Vials, Disp: 4 mL, Rfl: 5    tiotropium-olodaterol (STIOLTO RESPIMAT ) 2.5-2.5 mcg/actuation Mist, Inhale 2 puffs daily., Disp: 4 g, Rfl: 3    tiotropium-olodaterol (STIOLTO RESPIMAT ) 2.5-2.5 mcg/actuation Mist, Inhale 2 puffs daily., Disp: 4 g, Rfl: 0    tiotropium-olodaterol (STIOLTO RESPIMAT ) 2.5-2.5 mcg/actuation Mist, Inhale 2 puffs daily., Disp: 4 g, Rfl: 0    valACYclovir  (VALTREX ) 500 MG tablet, Take 1 tablet (500 mg total) by mouth daily., Disp: 90 tablet, Rfl: 3    Objective     Vital signs:  There were no vitals taken for this visit.    Physical Exam:  Const []  vital signs above    [x]  NAD, non-toxic appearance []  Chronically ill-appearing, non-distressed        Eyes [x]  Lids normal bilaterally, conjunctiva anicteric and noninjected OU     [] PERRL  [] EOMI        ENMT [x]  Normal appearance of external nose and ears, no nasal discharge        [x]  MMM, no lesions on lips or gums []  No thrush, leukoplakia, oral lesions  []  Dentition good []  Edentulous []  Dental caries present  []  Hearing normal  []  TMs with good light reflexes bilaterally   Decreased hearing      Neck []  Neck of normal appearance and trachea midline        []  No thyromegaly, nodules, or tenderness   []  Full neck ROM        Lymph []  No LAD in neck     []  No LAD in supraclavicular area     []  No LAD in axillae   []  No LAD in epitrochlear chains     []  No LAD in inguinal areas        CV []  RRR            []  No peripheral edema     []  Pedal pulses intact   []  No abnormal heart sounds appreciated   []  Extremities WWP         Resp [x]   Normal WOB at rest    [x]  No breathlessness with speaking, no coughing  []  CTA anteriorly    []  CTA posteriorly          GI []  Normal inspection, NTND   []  NABS     []  No umbilical hernia on exam       []  No hepatosplenomegaly     []  Inspection of perineal and perianal areas normal        GU []  Normal external genitalia     [] No urinary catheter present in urethra   []  No CVA tenderness    []  No tenderness over renal allograft        MSK []  No clubbing or cyanosis of hands       []  No vertebral point tenderness  []  No focal tenderness or abnormalities on palpation of joints in RUE, LUE, RLE, or LLE        Skin [x]  No rashes, lesions, or ulcers of visualized skin     []  Skin warm and dry to palpation         Neuro []  Face expression symmetric  []  Sensation to light touch grossly intact throughout    []  Moves extremities equally    []  No tremor noted        []  CNs II-XII grossly intact     []  DTRs normal and symmetric throughout [x]  Gait unremarkable        Psych [x]  Appropriate affect       [x]  Fluent speech         [x]  Attentive, good eye contact  []  Oriented to person, place, time          []  Judgment and insight are appropriate   Mood better today        Data for Medical Decision Making     I discussed mgm't w/qualified health care professional(s) involved in case: plan to dc doxycycline .    I reviewed CBC results (wbc nl), chemistry results (Cr nl), and radiology report(s) (CTA lung parenchyma normal).    I independently visualized/interpreted not done.         Lab Results   Component Value Date    CRP 7.0 01/24/2023    Total IgG 271 (L) 03/20/2023     Lab Results   Component Value Date    WBC 6.2 09/07/2023    HGB 16.9 (H) 09/07/2023    HCT 48.3 (H) 09/07/2023    PLT 160 09/07/2023       Lab Results   Component Value Date    NA 141 09/07/2023    K 4.2 09/07/2023    CL 107 09/07/2023    CO2 28.0 09/07/2023    BUN 14 09/07/2023    CREATININE 1.71 (H) 09/07/2023    GLU 104 09/07/2023    CALCIUM 9.7 09/07/2023    MG 1.8 09/07/2023    PHOS 2.4 09/07/2023       Lab Results   Component Value Date    BILITOT 0.5 09/07/2023    BILIDIR 0.30 07/02/2022    PROT 6.6 09/07/2023    ALBUMIN 4.1 09/07/2023    ALT 17 09/07/2023    AST 16 09/07/2023    ALKPHOS 95 09/07/2023       Lab Results   Component Value Date    PT 10.2 04/07/2022    INR 0.91 04/07/2022    APTT 62.2 (H) 02/08/2022     Microbiology:  none    Imaging:  CTA  Chest W Contrast  Result Date: 09/08/2023   Impression: No pulmonary embolism or other acute findings.     XR Chest Portable  Result Date: 09/07/2023   Impression: No acute abnormalities.     CTA Abdominal Aorta And Bilateral Iliofem Runoff W Wo Contrast  Result Date: 08/23/2023   Impression: --Patent lower extremity arteries with normal three-vessel runoff in either leg. --Focal probable flow-limiting stenosis of the proximal left renal artery. Correlate clinically for history of hypertension and consider vascular and interventional radiology consultation.

## 2023-10-10 ENCOUNTER — Encounter
Admit: 2023-10-10 | Discharge: 2023-10-11 | Payer: MEDICAID | Attending: Infectious Disease | Primary: Infectious Disease

## 2023-10-10 DIAGNOSIS — C9101 Acute lymphoblastic leukemia, in remission: Principal | ICD-10-CM

## 2023-10-10 DIAGNOSIS — J189 Pneumonia, unspecified organism: Principal | ICD-10-CM

## 2023-10-10 DIAGNOSIS — A4902 Methicillin resistant Staphylococcus aureus infection, unspecified site: Principal | ICD-10-CM

## 2023-10-11 ENCOUNTER — Inpatient Hospital Stay: Admit: 2023-10-11 | Discharge: 2023-10-11 | Payer: MEDICAID

## 2023-10-11 MED ORDER — ALBUTEROL SULFATE HFA 90 MCG/ACTUATION AEROSOL INHALER
Freq: Four times a day (QID) | RESPIRATORY_TRACT | 0 refills | 0.00000 days | Status: CP | PRN
Start: 2023-10-11 — End: 2024-10-10

## 2023-10-11 MED ADMIN — fentaNYL (PF) (SUBLIMAZE) injection: INTRAVENOUS | @ 13:00:00 | Stop: 2023-10-11

## 2023-10-11 MED ADMIN — ceFAZolin (ANCEF) injection: INTRAVENOUS | @ 13:00:00 | Stop: 2023-10-11

## 2023-10-11 MED ADMIN — protamine injection: INTRAVENOUS | @ 14:00:00 | Stop: 2023-10-11

## 2023-10-11 MED ADMIN — famotidine (PF) (PEPCID) injection: INTRAVENOUS | @ 13:00:00 | Stop: 2023-10-11

## 2023-10-11 MED ADMIN — midazolam (VERSED) injection: INTRAVENOUS | @ 13:00:00 | Stop: 2023-10-11

## 2023-10-11 MED ADMIN — heparin (porcine) 1000 unit/mL injection: INTRAVENOUS | @ 13:00:00 | Stop: 2023-10-11

## 2023-10-11 MED ADMIN — diphenhydrAMINE (BENADRYL) injection: INTRAVENOUS | @ 13:00:00 | Stop: 2023-10-11

## 2023-10-11 MED ADMIN — lidocaine (PF) (XYLOCAINE-MPF) 10 mg/mL (1 %) injection: INTRADERMAL | @ 13:00:00 | Stop: 2023-10-11

## 2023-10-11 MED ADMIN — midazolam (VERSED) injection: INTRAVENOUS | @ 14:00:00 | Stop: 2023-10-11

## 2023-10-11 MED ADMIN — fentaNYL (PF) (SUBLIMAZE) injection: INTRAVENOUS | @ 14:00:00 | Stop: 2023-10-11

## 2023-10-11 MED ADMIN — iodixanol (VISIPAQUE) 270 mg iodine/mL injection 80 mL: 80 mL | INTRA_ARTERIAL | @ 13:00:00 | Stop: 2023-10-11

## 2023-10-11 MED ADMIN — heparin (porcine) 1000 unit/mL injection: INTRAVENOUS | @ 14:00:00 | Stop: 2023-10-11

## 2023-10-12 MED ORDER — CLOPIDOGREL 75 MG TABLET
ORAL_TABLET | Freq: Every day | ORAL | 2 refills | 30.00000 days | Status: CP
Start: 2023-10-12 — End: 2024-01-10

## 2023-10-13 DIAGNOSIS — C9101 Acute lymphoblastic leukemia, in remission: Principal | ICD-10-CM

## 2023-10-13 MED ORDER — OXYCODONE 10 MG TABLET
ORAL_TABLET | ORAL | 0 refills | 20.00000 days | Status: CP | PRN
Start: 2023-10-13 — End: ?

## 2023-10-13 MED ORDER — OXYCODONE ER 20 MG TABLET,CRUSH RESISTANT,EXTENDED RELEASE 12 HR
ORAL_TABLET | Freq: Three times a day (TID) | ORAL | 0 refills | 28.00000 days | Status: CP
Start: 2023-10-13 — End: 2023-11-10

## 2023-10-24 DIAGNOSIS — C9101 Acute lymphoblastic leukemia, in remission: Principal | ICD-10-CM

## 2023-10-30 DIAGNOSIS — C9101 Acute lymphoblastic leukemia, in remission: Principal | ICD-10-CM

## 2023-11-02 NOTE — Unmapped (Signed)
 [facility] Specialty and Home Delivery Pharmacy Refill Coordination Note    Specialty Medication(s) to be Shipped:   Hematology/Oncology: Iclusig     Other medication(s) to be shipped: No additional medications requested for fill at this time     Adam Keith, DOB: 03-17-1981  Phone: [phone] (work)      All above HIPAA information was verified with patient.     Was a Nurse, learning disability used for this call? No    Completed refill call assessment today to schedule patient's medication shipment from the [facility] and Home Delivery Pharmacy  [phone]).  All relevant notes have been reviewed.     Specialty medication(s) and dose(s) confirmed: Regimen is correct and unchanged.   Changes to medications: Rock reports no changes at this time.  Changes to insurance: No  New side effects reported not previously addressed with a pharmacist or physician: None reported - no new side effects  Questions for the pharmacist: No    Confirmed patient received a Conservation officer, historic buildings and a Surveyor, mining with first shipment. The patient will receive a drug information handout for each medication shipped and additional FDA Medication Guides as required.       DISEASE/MEDICATION-SPECIFIC INFORMATION        N/A    SPECIALTY MEDICATION ADHERENCE     Medication Adherence    Patient reported X missed doses in the last month: 0  Specialty Medication: Iclusig  30 mg once daily  Patient is on additional specialty medications: No  Informant: patient  Confirmed plan for next specialty medication refill: delivery by pharmacy  Refills needed for supportive medications: not needed          Refill Coordination    Has the Patients' Contact Information Changed: No  Is the Shipping Address Different: No         Were doses missed due to medication being on hold? No    Iclusig  30 mg: unsure days of medicine on hand - wife keeps up with medication but he has not missed any doses.    REFERRAL TO PHARMACIST     Referral to the pharmacist: Not needed      [facility]     Shipping address confirmed in Epic.     Cost and Payment: Patient has a copay of $4. They are aware and have authorized the pharmacy to charge the credit card on file.    Delivery Scheduled: Yes, Expected medication delivery date: 11/09/23.     Medication will be delivered via UPS to the prescription address in Epic WAM.    [name] Merck, [facility]   [facility] Special[facility] and Home Delivery Pharmacy  Specialty Pharmacist

## 2023-11-02 NOTE — Unmapped (Signed)
 This pharmacist was notified by a technician that this patient has reported they are experiencing the following side effects Skin Cancer.  .. I have reached out to the patient and He was diagnosed by a doctor at the [place].  Skin cancer can be caused by certain chemo medications.  He has an appointment at [facility] for further testing /removal of skin cancer.  No further action required by pharmacist.      Approximate time spent: 5-10 minutes    [name] Merck, [facility], Clinical Specialty Pharmacist  [facility] Special[facility] and Home Delivery Pharm[facility]

## 2023-11-08 DIAGNOSIS — C9101 Acute lymphoblastic leukemia, in remission: Principal | ICD-10-CM

## 2023-11-08 MED FILL — ICLUSIG 30 MG TABLET: ORAL | 30 days supply | Qty: 30 | Fill #2

## 2023-11-09 MED ORDER — OXYCODONE 10 MG TABLET
ORAL_TABLET | ORAL | 0 refills | 20.00000 days | Status: CP | PRN
Start: 2023-11-09 — End: ?

## 2023-11-09 MED ORDER — OXYCODONE ER 20 MG TABLET,CRUSH RESISTANT,EXTENDED RELEASE 12 HR
ORAL_TABLET | Freq: Three times a day (TID) | ORAL | 0 refills | 28.00000 days | Status: CP
Start: 2023-11-09 — End: 2023-12-07

## 2023-11-10 DIAGNOSIS — C9101 Acute lymphoblastic leukemia, in remission: Principal | ICD-10-CM

## 2023-11-13 ENCOUNTER — Encounter: Admit: 2023-11-13 | Discharge: 2023-11-14 | Payer: MEDICAID | Attending: Adult Health | Primary: Adult Health

## 2023-11-13 DIAGNOSIS — C9101 Acute lymphoblastic leukemia, in remission: Principal | ICD-10-CM

## 2023-11-13 NOTE — Unmapped (Signed)
 Spoke to pt prior to video visit. No refills needed. Pharmacy updated. Pt to log in closer to appt time.

## 2023-11-13 NOTE — Unmapped (Signed)
 Please get labs in the next 1-2 weeks. Once we have the BCR/ABL level back, we can confirm the plan about ponatinib . Tentative plan would be to reduce the dose to 15 mg daily, if the BCR/ABL level is undetectable.

## 2023-11-13 NOTE — Unmapped (Signed)
 The patient reports they are physically located in [place]  and is currently: not at home. I conducted a audio/video visit. I spent  23m 01s on the video call with the patient. I spent an additional 10 minutes on pre- and post-visit activities on the date of service .   [facility]    Patient Name: Adam Keith  Patient Age: 43 y.o.  Encounter Date: 11/13/2023    Primary Care Provider:  Epifanio Alm Dover, MD    Referring Physician:  Iva Alyce Debby ELNITA  [address]  [id number] [facility] [place],  [place] [address]    Cancer Diagnosis: Ph+ ALL; Initial Dx 01/2020  Cancer status: p210 0.007% on 09/01/23   Treatment Regimen: Third Line, ponatinib  monotherapy since 01/2021  Treatment Goal: Curative  Comorbidities: COPD, OSA, DVT/PE, tobacco use, hypogonadism  Transplant: Discussed, but did not want to proceed    Assessment:  A 43 y.o. 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 The course was complicated by septic shock, candida krusei fungemia, MRSA bacteremia with septic emboli c/b acute renal failure and respiratory distress requiring dialysis and intubation.  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  He subsequently had difficulty tolerating single agent dasatinib  (as a bridge to planned ponatinib -blinatumomab --had fluid retention and pleural effusion).  He then completed 4 cycles of ponatinib -blinatumomab , initiated for treatment of MRD in the setting of poor tolerance of standard therapy.      He is currently on ponatinib  30 mg monotherapy (on monotherapy since 01/2021).  The course has been complicated by chronic pain, weakness, fatigue that are multifactorial. He continues to follow with Palliative Care, ICID, and pulmonology.  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 Currently on ponatinib  30 mg daily, started 09/06/23. Plan to reduce dose if BCR-ABL becomes undetectable. Open to lower dose to reduce symptoms.  - Continue ponatinib  30 mg daily.  - Order labs including BCR-ABL level this week or next week.  - Discuss lab results via MyChart and phone call if needed.  - If BCR-ABL level is undetectable, decrease ponatinib  dose to 15 mg daily.  - Recheck BCR-ABL level in three months.  - Schedule follow-up visit in three months.    Chronic pain syndrome  Chronic pain persists despite ponatinib  discontinuation. Managed by palliative care team. Worsening symptoms at night.  - Continue follow-up with Dr. Lavin from palliative care for chronic pain management.    Chronic kidney disease and renal artery stenosis, status post stent placement  Chronic kidney disease secondary to sepsis. Recent stent placement for renal artery stenosis. No significant symptom improvement post-procedure.  - Continue follow-up with nephrology and interventional radiology.  - Attend follow-up ultrasound and consultation next week.    Chronic obstructive pulmonary disease (COPD)  COPD managed by pulmonology. Discontinued doxycycline  due to photosensitivity. Breathing difficulties noted without doxycycline .  - continue to follow with Pulmonology    Ongoing tobacco use  Continued tobacco use despite cessation recommendations. Smoking cessation advised post-stent placement.  - Encourage smoking cessation.    Hypogonadism  Hypogonadism managed by urology. Inconsistent testosterone  therapy adherence. Plans to resume therapy before blood work.  - Resume testosterone  therapy as prescribed.  - Plan to  be on testosterone  for a month before obtaining blood work.      MRSA, repeated infections  - followed by Dr. Arlander, ICID  - currently of doxycycline  due to easy sunburns. Skin improved but patient feels breathing is worse and is considering restarting doxy  - pt to discuss with Dr. 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   - No plan for further IVIG given several reactions   - Rec Flu and Covid vaccines     Hx DRESS: followed by Dr. Brinda, dermatology  - rash currently resolved  - prednisone  completed 12/14/21      Hx AKI-->CKD, due to sepsis. Now s/p dialysis  - stable Cr appears to have developed CKD due to incompletely healed acute injury.  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 CT chest 02/06/22 with residual subacute thrombus.   - Indefinite anticoagulation with apixaban  (in addition to asa given ponatinib )     Vitamin D  Deficiency: was low 07/2022.   - started supplementation 07/2022.   - He has not noticed any change in symptoms with it. Will recheck level in 1 month.     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 Arlander and Dr. Fleeta Abbot and Dr. Cary following patient.    - long term doxycycline  with goal of decolonization  **Hx Candidemia during induction - now s/p Cresemba   - followed by Dr. Arlander  **Hx Covid-19 x 4 documented   - possible long-Covid symptoms    Symptom management: bowel regimen as listed in medication list, analgesics as listed in medication list or symptoms reviewed and due to acceptable control, no changes made  **Peripheral neuropathy, sensory and motor  - Grade 1-2 monitor  **Chronic Back Pain, complicated by hospital stay and procedures  - Followed by Dr. Lavin, Palliative Care  **Depression/anxiety: following with [name], [facility]  - management per psychiatry     Venous access: I recommend the following change for venous access : PICC line removal      Supportive Care Recommendations:  We recommend based on the patient???s underlying diagnosis and treatment history the following supportive care:    1. Antimicrobial prophylaxis:   - Primary management per ICID  - On daily doxy for recurrent MRSA pna    - Viral - valacyclovir  500mg  daily  - Bacterial - levofloxacin  500 mg if ANC <0.5  - Fungal - previously on treatment isavuconazole - off now  - PJP - On Bactrim  DS BID Sat/Sun  - recommend Annual flu shot - declines thus far  - recommend updated Covid vaccine - declines    2. Blood product support:  I recommend the following intervals for laboratory monitoring to determine transfusion needs and monitor for hematologic effects of therapy and underlying disease: no routine monitoring outside of clinic follow-up    Leukoreduced blood products are required.  Irradiated blood products are preferred, but in case of urgent transfusion needs non-irradiated blood products may be used:     -  RBC transfusion threshold: transfuse 2 units for Hgb < 8  g/dL.  -  Platelet transfusion threshold: transfuse 1 unit of platelets for platelet count < 10, or for bleeding or need for invasive procedure.    3. Hematopoietic growth factor support: none    Care coordination:   - as detailed above    Alyce Shaggy, AGPCNP-BC  Nurse Practitioner  Hematology/Oncology  [facility]      Toribio Bunker, MD was available  Leukemia Program  Division of Hematology  [facility]     Nurse Navigator (non-clinical trial patients): Irving Margo, RN       Tel. [phone]       Fax. [phone]  Toll-free appointments: [phone]  Scheduling assistance: [phone]  After hours/weekends: [phone] (ask for adult hematology/oncology on-call)      History of Present Illness:  We had the pleasure of seeing Adam Keith in the Leukemia Clinic at the [facility]  on 11/13/2023.  He is a 43 y.o. male with  Ph+ B-ALL .  He is currently on ponatinib , a third-generation TKI, and has been in deep remission since July, with BCR-ABL levels consistently low.     Interval hx:     Since his last visit he trialed stopping ponatinib . He felt okay for the 2 weeks but then started feeling worse. Around this time he also developed parainfluenza and felt quite sick. He feels that the withdrawal symptoms from ponatinib  made the URI symptoms much worse. He restarted ponatinib  6/4. He is starting to feel better. URI symptoms are lingering but significantly better. The increased pain he experienced with the infection/withdrawal are diminished back to baseline chronic pain.          His oncologic history is as follows:    Hematology/Oncology History Overview Note   Referring/Local Oncologist: None    Diagnosis:Ph+ ALL    Genetics:    Karyotype/FISH:Abnormal Karyotype: 46,XY,t(9;22)(q34;q11.2)[1]/45,XY,der(7;9)(q10;q10)t(9;22)(q34;q11.2),der(22)t(9;22)[11]/46,sdl,+der(22)t(9;22)[5]/46,XY[3]     Abnormal FISH: A BCR/ABL1 interphase FISH assay shows an abnormal signal pattern in 97% of the 100 cells scored. 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  The findings support a diagnosis of leukemia and have implications for targeted therapy and for monitoring residual disease.      Molecular Genetics:  BCR-ABL1 p210 transcripts were detected at a level of 46.479 IS% ratio in bone marrow.  BCR-ABL1 p190 transcripts were detected at a level of 4 in 100,000 cells in bone marrow.    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      01/23/2020 Initial Diagnosis    Acute lymphoblastic leukemia (ALL) not having achieved remission (CMS-HCC)     01/24/2020 - 04/02/2020 Chemotherapy    IP/OP LEUKEMIA GRAAPH-2005 + RITUXIMAB < 60 YO  rituximab hypercvad (odd and even course)     02/24/2020 Remission    CR with PCR MRD+; marrow showing 70% blasts with <1% blasts, negative flow MRD, BCR-ABL p210 PCR 0.197%; CNS negative for blasts     03/09/2020 Progression    CSF - rare blast ID'ed - IT chemo given    03/13/20 - IT chemo, CSF negative     04/16/2020 Biopsy    BM Bx with 40-50% cellularity, <1% blasts, MRD flow cytometry negative, BCR-ABL p210 transcripts 0.036%     05/28/2020 - 05/28/2020 Chemotherapy    OP  AML - CNS THERAPY (INTRATHECAL CYTARABINE , INTRATHECAL METHOTREXATE , OR INTRATHECAL TRIPLE)  Select one of the following: cytarabine  IT 100 mg with hydrocortisone  50 mg, cytarabine  IT 40 mg with methotrexate  15 mg with hydrocortisone  50 mg, OR methotrexate  IT 12 mg with hydrocortisone  50 mg     06/19/2020 -  Chemotherapy    IP/OP LEUKEMIA BLINATUMOMAB  7-DAY INFUSION (MINIMAL RESIDUAL DISEASE; WT >= 22 KG) (HOME INFUSION)      Cycles 1*-4: Blinatumomab  28 mcg/day Days 1-28 of 6-week cycle.  *Given in the inpatient setting on Days 1-3 on Cycle 1 and Days 1-2 on Cycle 2, while other treatment days are given in the outpatient setting.    Cycle 1 MRD Dosing     07/18/2020 Adverse Reaction    Hospitalization: fevers. Pneumonia     07/23/2020 Biopsy    Diagnosis  Bone marrow, right iliac, aspiration and biopsy  -   Normocellular bone marrow (50%) with trilineage hematopoiesis and 1% blasts by manual aspirate differential  -   Flow cytometry MRD analysis reveals no definitive immunophenotypic evidence of residual B lymphoblastic leukemia   - BCR-ABL p210 0.006%         08/10/2020 -  Chemotherapy    Cycle 2 blinatumomab -ponatinib   Ponatinib  30mg     1 IT per cycle     09/21/2020 Adverse Reaction    Covid-19, symptomatic but not requiring hospitalization.     10/06/2020 -  Chemotherapy    Cycle 3 blinatumomab -ponatinib   Ponatinib  30mg        11/01/2020 Adverse Reaction    Hospitalization: MRSA Pneumonia requiring intubation    Blinatumomab  stopped.  Ponatinib  held until he stabilized.       12/14/2020 -  Chemotherapy    Cycle 4 blinatumomab  28 mcg/day days 1-28, ponatinib  30 mg per day IT triple therapy day 29     01/13/2021 -  Chemotherapy    Ponatinib  30mg  daily  - due to due low level BCR-ABL     03/06/2021 Adverse Reaction    Hospitalization - ICU on ventilator - influenza and MRSA/klebsiella PNA     06/13/2022 -  Chemotherapy    OP LEUKEMIA VINCRISTINE  vinCRIStine 2 mg IV on day 1     ALL (acute lymphoblastic leukemia)      06/11/2020 -  Chemotherapy    IP/OP LEUKEMIA BLINATUMOMAB  7-DAY INFUSION (MINIMAL RESIDUAL DISEASE; WT >= 22 KG) (HOME INFUSION)  Cycles 1*-4: Blinatumomab  28 mcg/day Days 1-28 of 6-week cycle.  *Given in the inpatient setting on Days 1-3 on Cycle 1 and Days 1-2 on Cycle 2, while other treatment days are given in the outpatient setting.     09/21/2020 Initial Diagnosis    ALL (acute lymphoblastic leukemia) (CMS-HCC)     06/13/2022 -  Chemotherapy    OP LEUKEMIA VINCRISTINE  vinCRIStine 2 mg IV on day 1     Acute lymphoblastic leukemia in remission      06/11/2020 -  Chemotherapy    IP/OP LEUKEMIA BLINATUMOMAB  7-DAY INFUSION (MINIMAL RESIDUAL DISEASE; WT >= 22 KG) (HOME INFUSION)  Cycles 1*-4: Blinatumomab  28 mcg/day Days 1-28 of 6-week cycle.  *Given in the inpatient setting on Days 1-3 on Cycle 1 and Days 1-2 on Cycle 2, while other treatment days are given in the outpatient setting.     11/09/2020 Initial Diagnosis    Acute lymphoblastic leukemia in remission (CMS-HCC)     06/13/2022 -  Chemotherapy    OP LEUKEMIA VINCRISTINE  vinCRIStine 2 mg IV on day 1  Past Medical, Surgical and Family History were reviewed and pertinent updates were made in the Electronic Medical Record      ECOG Performance Status: 1    Medications:      Current Outpatient Medications   Medication Sig Dispense Refill    albuterol  HFA 90 mcg/actuation inhaler Inhale 2 puffs every six (6) hours as needed for wheezing (wheezing). 18 g 0    amLODIPine  (NORVASC ) 10 MG tablet Take 1 tablet (10 mg total) by mouth daily. 30 tablet 0    arm brace (WRIST BRACE) Misc 1 Piece by Miscellaneous route as needed. left ulnar wrist brace 1 each 0    carvediloL  (COREG ) 12.5 MG tablet Take 1 tablet (12.5 mg total) by mouth Two (2) times a day. 180 tablet 1    cholecalciferol , vitamin D3-250 mcg, 10,000 unit,, 250 mcg (10,000 unit) capsule Take 1 capsule (250 mcg total) by mouth daily. 120 capsule 3    clopidogrel  (PLAVIX ) 75 mg tablet Take 1 tablet (75 mg total) by mouth daily. 30 tablet 2    docusate sodium (COLACE) 100 MG capsule Take 1 capsule (100 mg total) by mouth daily.      doxycycline  (VIBRA -TABS) 100 MG tablet Take 1 tablet (100 mg total) by mouth two (2) times a day. 180 tablet 3    ELIQUIS  5 mg Tab TAKE 1 TABLET BY MOUTH TWO TIMES A DAY. 60 tablet 6    hydroCHLOROthiazide  12.5 MG tablet Take 1 tablet (12.5 mg total) by mouth daily. (Patient not taking: Reported on 10/11/2023) 30 tablet 11    naloxone  (NARCAN ) 4 mg nasal spray One spray in either nostril once for known/suspected opioid overdose. May repeat every 2-3 minutes in alternating nostril til EMS arrives 2 each 0    nortriptyline  (PAMELOR ) 50 MG capsule TAKE 2 CAPSULES BY MOUTH EVERY DAY AT NIGHT 180 capsule 3    omeprazole (PRILOSEC) 40 MG capsule Take 1 capsule (40 mg total) by mouth every morning.      ondansetron  (ZOFRAN -ODT) 4 MG disintegrating tablet Take 1 tablet (4 mg total) by mouth every eight (8) hours as needed for nausea.      oxyCODONE  (OXYCONTIN ) 20 mg TR12 12 hr crush resistant ER/CR tablet Take 1 tablet (20 mg total) by mouth every eight (8) hours for 28 days. 84 tablet 0    oxyCODONE  (ROXICODONE ) 10 mg immediate release tablet Take 1 tablet (10 mg total) by mouth every four (4) hours as needed for pain. 120 tablet 0    PONATinib  (ICLUSIG ) 30 mg tablet Take 1 tablet (30 mg total) by mouth daily. Swallow tablets whole. Do not crush, break, cut or chew tablets. 30 tablet 5    spironolactone  (ALDACTONE ) 25 MG tablet TAKE 1 TABLET (25 MG TOTAL) BY MOUTH DAILY. 90 tablet 3    sulfamethoxazole -trimethoprim  (BACTRIM  DS) 800-160 mg per tablet Take 1 tablet (160 mg of trimethoprim  total) by mouth two (2) times a day. TAKE 1 TABLET BY MOUTH 2 TIMES A DAY ON SATURDAY, SUNDAY. FOR PROPHYLAXIS 48 tablet 3    syringe with needle, safety (BD ECLIPSE LUER-LOK) 3 mL 22 gauge x 1 1/2 Syrg To be used with TC injections 50 each 0    testosterone  cypionate (DEPOTESTOTERONE CYPIONATE) 200 mg/mL injection Inject 0.5 mL (100 mg total) into the muscle once a week. Single Use Vials 4 mL 5    tiotropium-olodaterol (STIOLTO RESPIMAT ) 2.5-2.5 mcg/actuation Mist Inhale 2 puffs daily. 4 g 3    tiotropium-olodaterol (STIOLTO RESPIMAT ) 2.5-2.5  mcg/actuation Mist Inhale 2 puffs daily. 4 g 0    tiotropium-olodaterol (STIOLTO RESPIMAT ) 2.5-2.5 mcg/actuation Mist Inhale 2 puffs daily. 4 g 0    valACYclovir  (VALTREX ) 500 MG tablet Take 1 tablet (500 mg total) by mouth daily. 90 tablet 3     No current facility-administered medications for this visit.       Vital Signs:  There were no vitals filed for this visit.    GENERAL: Well-appearing white man. NAD. Driving.    HEENT: Pupils equal, round. EOMI.   HEART: Normal color, not excessive pallor. Not ashen.   CHEST/LUNG: Normal work of breathing. No dyspnea with conversation.   EXTREMITIES: Not viewed.   NEURO EXAM: Grossly intact.         MDM:  1 chronic life threatening illness  Drug therapy requiring intense monitoring for toxicity

## 2023-11-14 DIAGNOSIS — C9101 Acute lymphoblastic leukemia, in remission: Principal | ICD-10-CM

## 2023-11-21 ENCOUNTER — Encounter
Admit: 2023-11-21 | Discharge: 2023-11-22 | Payer: Medicaid (Managed Care) | Attending: Adult Health | Primary: Adult Health

## 2023-11-21 ENCOUNTER — Ambulatory Visit: Admit: 2023-11-21 | Discharge: 2023-11-22 | Payer: MEDICAID

## 2023-11-21 LAB — CBC W/ AUTO DIFF
BASOPHILS ABSOLUTE COUNT: 0.1 10*9/L (ref 0.0–0.1)
BASOPHILS RELATIVE PERCENT: 1.3 %
EOSINOPHILS ABSOLUTE COUNT: 0.5 10*9/L (ref 0.0–0.5)
EOSINOPHILS RELATIVE PERCENT: 5.2 %
HEMATOCRIT: 48.6 % — ABNORMAL HIGH (ref 39.0–48.0)
HEMOGLOBIN: 17.1 g/dL — ABNORMAL HIGH (ref 12.9–16.5)
LYMPHOCYTES ABSOLUTE COUNT: 2.7 10*9/L (ref 1.1–3.6)
LYMPHOCYTES RELATIVE PERCENT: 25.4 %
MEAN CORPUSCULAR HEMOGLOBIN CONC: 35.2 g/dL (ref 32.0–36.0)
MEAN CORPUSCULAR HEMOGLOBIN: 29.3 pg (ref 25.9–32.4)
MEAN CORPUSCULAR VOLUME: 83.5 fL (ref 77.6–95.7)
MEAN PLATELET VOLUME: 9.3 fL (ref 6.8–10.7)
MONOCYTES ABSOLUTE COUNT: 0.5 10*9/L (ref 0.3–0.8)
MONOCYTES RELATIVE PERCENT: 4.7 %
NEUTROPHILS ABSOLUTE COUNT: 6.6 10*9/L (ref 1.8–7.8)
NEUTROPHILS RELATIVE PERCENT: 63.4 %
NUCLEATED RED BLOOD CELLS: 0 /100{WBCs} (ref <=4)
PLATELET COUNT: 187 10*9/L (ref 150–450)
RED BLOOD CELL COUNT: 5.82 10*12/L — ABNORMAL HIGH (ref 4.26–5.60)
RED CELL DISTRIBUTION WIDTH: 15.2 % (ref 12.2–15.2)
WBC ADJUSTED: 10.5 10*9/L (ref 3.6–11.2)

## 2023-11-21 LAB — COMPREHENSIVE METABOLIC PANEL
ALBUMIN: 4.2 g/dL (ref 3.4–5.0)
ALKALINE PHOSPHATASE: 101 U/L (ref 46–116)
ALT (SGPT): 16 U/L (ref 10–49)
ANION GAP: 11 mmol/L (ref 5–14)
AST (SGOT): 14 U/L (ref <=34)
BILIRUBIN TOTAL: 0.5 mg/dL (ref 0.3–1.2)
BLOOD UREA NITROGEN: 11 mg/dL (ref 9–23)
BUN / CREAT RATIO: 7
CALCIUM: 10.1 mg/dL (ref 8.7–10.4)
CHLORIDE: 104 mmol/L (ref 98–107)
CO2: 30.5 mmol/L (ref 20.0–31.0)
CREATININE: 1.69 mg/dL — ABNORMAL HIGH (ref 0.73–1.18)
EGFR CKD-EPI (2021) MALE: 51 mL/min/1.73m2 — ABNORMAL LOW (ref >=60)
GLUCOSE RANDOM: 95 mg/dL (ref 70–179)
POTASSIUM: 4 mmol/L (ref 3.4–4.8)
PROTEIN TOTAL: 6.7 g/dL (ref 5.7–8.2)
SODIUM: 145 mmol/L (ref 135–145)

## 2023-11-22 DIAGNOSIS — C9101 Acute lymphoblastic leukemia, in remission: Principal | ICD-10-CM

## 2023-11-23 ENCOUNTER — Encounter
Admit: 2023-11-23 | Discharge: 2023-11-24 | Payer: Medicaid (Managed Care) | Attending: Student in an Organized Health Care Education/Training Program | Primary: Student in an Organized Health Care Education/Training Program

## 2023-11-23 DIAGNOSIS — C9101 Acute lymphoblastic leukemia, in remission: Principal | ICD-10-CM

## 2023-11-23 DIAGNOSIS — F32A Depression, unspecified depression type: Principal | ICD-10-CM

## 2023-11-23 DIAGNOSIS — Z515 Encounter for palliative care: Principal | ICD-10-CM

## 2023-11-23 DIAGNOSIS — G893 Neoplasm related pain (acute) (chronic): Principal | ICD-10-CM

## 2023-11-23 NOTE — Unmapped (Signed)
 Spoke to pt prior to video visit. No refills needed. Pharmacy confirmed. Pt to log in closer to appt time.

## 2023-11-23 NOTE — Unmapped (Signed)
 OUTPATIENT ONCOLOGY PALLIATIVE CARE     Principal Diagnosis: [name] is a 43 y.o. 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 He was recently admitted to [facility] with lowe extremity pain and edema. Has had episodic fluctuations in mood.  Has decided not to pursue bone marrow transplant at this time. Has felt chronically ill for the last year.  Unclear unfortunately exactly what is contributing to this, like the combination of treatment side effects and refractory depression.  Had started to feel a little bit better with testosterone  supplementation although this benefit seems to be wearing off. Appreciate oncology and psychiatry assistance in management.     Assessment/Plan:      # Depressed Mood: Chronic depression, continues to feel down due to constitutional fatigue, pain, inability to go outside and do things that bring meaning to him.  Continues to follow with [name].  No safety concerns but continues to feel poorly overall.  -Medications per [name]        #Pain: Chronic, persistent pain, primarily in joints. Associated with/exacerbated by sitting too long, though he also says that activity seems to make it worst. Previously trialed butrans  patch but did not find this effective. MRI of the spine showed narrowing in the lumbar region likely causing sciatica-like pain.  Trying to limit opioids, but given level of pain and distress impacting quality of life to address symptoms.  Fortunately feeling much better today.  Only requiring OxyContin  not needing breakthrough oxycodone .    -Start OxyContin  20 mg p.o. every 8 hours  - oxycodone  10 mg q4 prn for breakthrough pain, using 4-6 times a day.  Refill sent yesterday      #Persistent productive cough, ENT infections, low grade fevers  -Not addressed in detail today       #Chronic pain: At baseline, see above  - Pain management as above     #Advance care planning: Continues to prioritize quality of life.  After discussion with infectious disease as well as oncology has made the decision not to pursue another bone marrow transplant at this time.  Reviewed previously if he is considering not continuing cancer directed therapy.  He states that despite how poorly he has been feeling he is not at that stage yet.     #Controlled substances risk management:  Patient does not have a signed pain medication agreement with our team.  [place]CCSRS database was reviewed today and it was appropriate.  Urine drug screen was not performed at this visit. Findings: not applicable.  Patient has received information about safe storage and administration of medications.  Patient has not received a prescription for narcan ; is not applicable.      F/u: 6-10 weeks     ----------------------------------------  Referring Provider: Dr. Adina Saba  Oncology Team: Malignant hematology team  PCP: DAVID BELVIE NEEDLE, MD     HPI: 43 year old man with a diagnosis of B-ALL, diagnosed in October 2021, now in complete remission. Recently admitted to the [facility] ICU 03/06/21-03/20/21 for AHRF d/t MRSA/klebsiella PNA, requiring intubation. This is following admissions in August, May, April, March, February, and January 2022.     Interval History:   Pain is starting to get a little bit better. Taking the Oxycontin  20mg  q8h. Still has pain when he works or is in the [employer] and it feels more like normal pain. Had bad symptoms of discontinuation when trying to stop the cancer therapy. Has stopped the Doxy. Still has his flu  like symptoms which is frustrating.     Has been able to go back to work which has given him more purpose. Hopeful to be able to decrease ponatinib  to 15mg  if his blood work is reassuring.     Mood overall has been better. More good days than bad during the day. Still has tough days, but less frequent. Was diagnosed with skin cancer on his nose and neck, thought to be related to his chemo.  Has lost 50 lbs overall and feels like he has gotten significantly weaker,         Palliative Performance Scale: 80% - Ambulation: Full / Normal Activity, some evidence of disease / Self-Care:Full / Intake: Normal / Level of Conscious: Full     Coping/Support Issues: Having a lot of difficulty coping with his current setback. Well supported by his wife. Also has a psychiatrist and therapist.     Goals of Care: Treatment and cancer-directed therapy     Social History:  Name of primary support: Wife Bascom, 4 daughters and 1 son. 25, 22, 3 20 year olds.  Occupation: Was in the [employer]	[place]  Hobbies: Woodworking  Current residence / distance from [facility] [place]     Has a 70 year old son.      Advance Care Planning: Did not discuss this visit.        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    Onion Anaphylaxis    Vancomycin  Analogues Other (See Comments)       Hearing loss with Lasix      Other reaction(s): Other (See Comments)     Hearing loss     lasix      Hearing loss      lasix       Hearing loss with Lasix  Other reaction(s): Other (See Comments) Hearing loss lasix     Bismuth Subsalicylate Nausea And Vomiting    Privigen  [Immun Glob G(Igg)-Pro-Iga 0-50] Other (See Comments)       03/10/2021 VIG infusion stopped as patient developed rigors, tachycardia and HTN during infusion despite pre-medication; 03/12/21 completed IVIG with premedications and slower rate of infusion    Furosemide  Other (See Comments)       With Vancomycin  caused hearing loss     Other reaction(s): Other (See Comments)     With Vancomycin  caused hearing loss      With Vancomycin  caused hearing loss Other reaction(s): Other (See Comments) With Vancomycin  caused hearing loss    Gammagard            Family History:  Cancer-related family history includes Cancer in his maternal grandfather and mother. There is no history of Melanoma.  He indicated that the status of his mother is unknown. He indicated that the status of his father is unknown. He indicated that the status of his sister is unknown. He indicated that the status of his brother is unknown. He indicated that the status of his maternal grandmother is unknown. He indicated that the status of his  maternal grandfather is unknown. He indicated that the status of his paternal grandmother is unknown. He indicated that the status of his paternal grandfather is unknown. He indicated that the status of his maternal aunt is unknown. He indicated that the status of his maternal uncle is unknown. He indicated that the status of his paternal aunt is unknown. He indicated that the status of his paternal uncle is unknown. He indicated that the status of his neg hx is unknown.        REVIEW OF SYSTEMS:  A comprehensive review of 10 systems was negative except for pertinent positives noted in HPI.           Lab Results   Component Value Date     CREATININE 1.93 (H) 02/22/2022            Lab Results   Component Value Date     ALKPHOS 92 02/22/2022     BILITOT 0.4 02/22/2022     BILIDIR 0.20 02/07/2022     PROT 6.6 02/22/2022     ALBUMIN 4.1 02/22/2022     ALT 96 (H) 02/22/2022     AST 45 (H) 02/22/2022      Physical exam:  Gen: NAD  Pulm: nl WOB  Skin: Dry and red  Psych: Guarded and dysphoric      The patient reports they are physically located in [place]  and is currently: at home. I conducted a audio/video visit. I spent  65m 46s on the video call with the patient. I spent an additional 15 minutes on pre- and post-visit activities on the date of service .       Nyana Haren, MD  [facility] Outpatient Oncology Palliative Care

## 2023-11-24 DIAGNOSIS — C9101 Acute lymphoblastic leukemia, in remission: Principal | ICD-10-CM

## 2023-11-24 MED ORDER — PONATINIB 15 MG TABLET
ORAL_TABLET | Freq: Every day | ORAL | 5 refills | 30.00000 days | Status: CP
Start: 2023-11-24 — End: ?
  Filled 2023-11-28: qty 30, 30d supply, fill #0

## 2023-11-24 NOTE — Unmapped (Signed)
**  Pharmacy team, please see the message below. Please update the ponatinib  prescription to 15mg  daily.  Thanks!    Harden Cheng news, the BCR/ABL is undetectable again! We discussed reducing the dose to 15 mg once the BCR/ABL was undetectable. I am going to contact our pharmacy team so we can update the prescription and have it delivered to you. Please let me know when it arrives and the day you start. That way I know the Day 1 of your new dose. I will set up a 3 month lab visit. Please get labs (with BCR/ABL) approximately 1 week before the visit.    Please keep me up to date with any updates/concerns.       Alyce Alyce Shaggy, AGPCNP-BC  Nurse Practitioner  Hematology/Oncology  [facility]

## 2023-11-27 NOTE — Unmapped (Signed)
 Iclusig  dose reduced from 30 mg to 15 mg daily    [facility] Home Delivery Pharmacy Refill Coordination Note    Specialty Medication(s) to be Shipped:   Hematology/Oncology: Iclusig     Other medication(s) to be shipped: No additional medications requested for fill at this time    Specialty Medications not needed at this time: N/A     Adam Keith, DOB: [birth year]  Phone: [phone] (work)      All above HIPAA information was verified with patient.     Was a Nurse, learning disability used for this call? No    Completed refill call assessment today to schedule patient's medication shipment from the [facility] and [facility] Delivery Pharmacy  [phone]).  All relevant notes have been reviewed.     Specialty medication(s) and dose(s) confirmed: Dose reduced to 15 mg once daily (was on 30 mg previously)   Changes to medications: Stiven reports no changes at this time.  Changes to insurance: No  New side effects reported not previously addressed with a pharmacist or physician: None reported  Questions for the pharmacist: No    Confirmed patient received a Conservation officer, historic buildings and a Surveyor, mining with first shipment. The patient will receive a drug information handout for each medication shipped and additional FDA Medication Guides as required.       DISEASE/MEDICATION-SPECIFIC INFORMATION        N/A    SPECIALTY MEDICATION ADHERENCE     Medication Adherence    Patient reported X missed doses in the last month: >5  Specialty Medication: Iclusig  15 mg once daily - 14 day treatment break  Patient is on additional specialty medications: No  Informant: patient  Confirmed plan for next specialty medication refill: delivery by pharmacy  Refills needed for supportive medications: not needed          Were doses missed due to medication being on hold? Yes - 14 days    Iclusig  15 mg: 0 days of medicine on hand     REFERRAL TO PHARMACIST     Referral to the pharmacist: Not needed      [facility]     Shipping address confirmed in Epic.     Cost and Payment: Patient has a copay of $4. They are aware and have authorized the pharmacy to charge the credit card on file.    Delivery Scheduled: Yes, Expected medication delivery date: 11/29/23.     Medication will be delivered via UPS to the prescription address in Epic [place].    [name] Merck, [facility]   [facility] Delivery [facility]  Specialty Pharmacist

## 2023-12-04 DIAGNOSIS — D751 Secondary polycythemia: Principal | ICD-10-CM

## 2023-12-06 DIAGNOSIS — C9101 Acute lymphoblastic leukemia, in remission: Principal | ICD-10-CM

## 2023-12-06 NOTE — Unmapped (Signed)
 Ophthalmology referral placed for new intermittent diplopia and blurred vision. Patient with Ph+ B-ALL in remission or oral chemotherapy (ponatinib ). See Mychart message from 9/2-12/06/2023.    Alyce Shaggy, AGPCNP-BC  Nurse Practitioner  Hematology/Oncology  [facility]

## 2023-12-11 ENCOUNTER — Ambulatory Visit
Admit: 2023-12-11 | Discharge: 2023-12-11 | Payer: Medicare (Managed Care) | Attending: Vascular Surgery | Primary: Vascular Surgery

## 2023-12-11 ENCOUNTER — Inpatient Hospital Stay: Admit: 2023-12-11 | Discharge: 2023-12-11 | Payer: Medicare (Managed Care)

## 2023-12-11 DIAGNOSIS — C9101 Acute lymphoblastic leukemia, in remission: Principal | ICD-10-CM

## 2023-12-11 DIAGNOSIS — I701 Atherosclerosis of renal artery: Principal | ICD-10-CM

## 2023-12-11 MED ORDER — OXYCODONE ER 20 MG TABLET,CRUSH RESISTANT,EXTENDED RELEASE 12 HR
ORAL_TABLET | Freq: Three times a day (TID) | ORAL | 0 refills | 28.00000 days | Status: CP
Start: 2023-12-11 — End: 2024-01-08

## 2023-12-11 NOTE — Unmapped (Signed)
 VASCULAR SURGERY [place]    Patient Name: Adam Keith  Encounter Date: 12/11/2023  Referring provider: Vicci  Surgeon: Benrashid    ASSESSMENT     43 y.o. male with history of ALL (on po chemotherapy), DVT, PE, tobacco abuse, and leg pain.  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  He is on 4 antihypertensive medications, including diuretics, and has a reduced GFR. PVL today showed resolution of LRA stenosis after angioplasty and stenting. Px denies any flank pain or swelling. He is currently still taking elliquis. There are weak palpable b/l DP.    PLAN     Follow up in 6 months with duplex w/ Dr. Rodrick.  Continue anticoagulation therapy: Elliquis 5 mg.  Encouraged smoking cessation. Currently, still smoking about 1.5 ppd x 28 years.    HISTORY OF PRESENT ILLNESS      Adam Keith is a 43 y.o. male who presents for evaluation of renal artery stenosis.    Stenosis identified incidentally on CTA to work up his leg pain and weakness.  He is on 4 BP medications. Current smoker (1.5 ppd).  He reports leg pain in his hips, thighs, and knees, R>L.  He has a family hx CVA but no personal history of stroke like symptoms.  He is on Eliquis  for history of LLE DVT and subsequent PE. He is on ponatinib  for ALL in remission.    ROS: The balance of 10 systems reviewed is negative except as noted in the HPI     RX/ ALLERGIES/ MED HX/SURG HX/ SOC HX/FAM HX: Reviewed & updated in Epic    Current Outpatient Medications   Medication Instructions    albuterol  HFA 90 mcg/actuation inhaler 2 puffs, Inhalation, Every 6 hours PRN    amlodipine  (NORVASC ) 10 mg, Oral, Daily (standard)    arm brace (WRIST BRACE) Misc 1 Piece, Miscellaneous, As needed during procedure (one-step-med), left ulnar wrist brace    carvedilol  (COREG ) 12.5 mg, Oral, 2 times a day (standard)    cholecalciferol  (vitamin D3-250 mcg (10,000 unit)) 250 mcg, Oral, Daily (standard)    clopidogrel  (PLAVIX ) 75 mg, Oral, Daily (standard)    docusate sodium (COLACE) 100 mg, Daily (standard)    doxycycline  (VIBRA -TABS) 100 mg, Oral, 2 times a day (standard)    ELIQUIS  5 mg, Oral, 2 times a day (standard)    hydroCHLOROthiazide  12.5 mg, Oral, Daily (standard)    ICLUSIG  15 mg, Oral, Daily (standard), Swallow tablets whole. Do not crush, break, cut or chew tablets.    naloxone  (NARCAN ) 4 mg nasal spray One spray in either nostril once for known/suspected opioid overdose. May repeat every 2-3 minutes in alternating nostril til EMS arrives    nortriptyline  (PAMELOR ) 50 MG capsule TAKE 2 CAPSULES BY MOUTH EVERY DAY AT NIGHT    omeprazole (PRILOSEC) 40 mg, Every morning    ondansetron  (ZOFRAN -ODT) 4 mg, Every 8 hours PRN    oxyCODONE  (OXYCONTIN ) 20 mg, Oral, Every 8 hours    oxyCODONE  (ROXICODONE ) 10 mg, Oral, Every 4 hours PRN    spironolactone  (ALDACTONE ) 25 mg, Oral, Daily (standard)    sulfamethoxazole -trimethoprim  (BACTRIM  DS) 800-160 mg per tablet 160 mg of trimethoprim , Oral, 2 times a day (standard), TAKE 1 TABLET BY MOUTH 2 TIMES A DAY ON SATURDAY, SUNDAY. FOR PROPHYLAXIS    syringe with needle, safety (BD ECLIPSE LUER-LOK) 3 mL 22 gauge x 1 1/2 Syrg To be used with TC injections  testosterone  cypionate (DEPOTESTOTERONE CYPIONATE) 100 mg, Intramuscular, Weekly, Single Use Vials    tiotropium-olodaterol (STIOLTO RESPIMAT ) 2.5-2.5 mcg/actuation Mist 2 puffs, Inhalation, Daily (standard)    tiotropium-olodaterol (STIOLTO RESPIMAT ) 2.5-2.5 mcg/actuation Mist 2 puffs, Inhalation, Daily (standard)    tiotropium-olodaterol (STIOLTO RESPIMAT ) 2.5-2.5 mcg/actuation Mist 2 puffs, Inhalation, Daily (standard)    valACYclovir  (VALTREX ) 500 MG tablet Take 1 tablet (500 mg total) by mouth daily.        PHYSICAL EXAM      Vitals:    12/11/23 0942   Temp: 36.7 ??C (98.1 ??F)     General: well appearing, no acute distress   Head: normocephalic, atraumatic  Neck: Supple, no carotid bruit  Cardiac: RRR   Lungs: Easy work of breathing, CTA Bilaterally  Abdomen: Soft, non-distended, non tender, without mass  MSK: negative joint tenderness, normal gait and ROM  Skin: intact, good turgor, no sparse hair growth pattern, no onychomycosis   Neuro: AAO x 3, appropriate to questions  Extremities: Upper extremities warm and perfused.  Pulses: Bilateral DP pulses palpable. B/l feet are warm.    DIAGNOSTICS      Images and reports reviewed independently.    Labs:  Lab Results   Component Value Date    CREATININE 1.69 (H) 11/21/2023    LDL 123 (H) 05/13/2020    J8R 4.8 10/18/2021       Summary of Findings  Right: No evidence of hemodynamically significant stenosis detected in the renal artery. Kidney size and resistive index are within normal limits. Doppler signal demonstrated presence of flow in the right renal vein.  Left: Doppler signal demonstrated presence of flow in the left renal vein. Kidney size and resistive index are within normal limits. Elevated LRA velocity and left RAR is suggestive of a >60% stenosis at the LRA origin.  Mesenteric:  Other:     Duplex Findings:     +---------------+-------+-------+  -Vessel         -PSV    -EDV    -  +---------------+-------+-------+  -Aorta at SMA   -54 cm/s-8 cm/s -  +---------------+-------+-------+  -Aorta at Celiac-71 cm/s-13 cm/s-  +---------------+-------+-------+  +---------------+--------+-------++--------------+--------+--------+  -Right Renal Art-PSV     -EDV    --Left Renal Art-PSV     -EDV     -  +---------------+--------+-------++--------------+--------+--------+  -Origin         -155 cm/s-36 cm/s--Origin        -414 cm/s-174 cm/s-  +---------------+--------+-------++--------------+--------+--------+  -Proximal       -158 cm/s-38 cm/s--Proximal      -353 cm/s-195 cm/s-  +---------------+--------+-------++--------------+--------+--------+  -Mid            -61 cm/s -17 cm/s--Mid           -75 cm/s -20 cm/s -  +---------------+--------+-------++--------------+--------+--------+  -Distal         -39 cm/s -12 cm/s--Distal        -52 cm/s -18 cm/s -  +---------------+--------+-------++--------------+--------+--------+  -Hilum          -35 cm/s -11 cm/s--Hilum         -25 cm/s -11 cm/s -  +---------------+--------+-------++--------------+--------+--------+  +-------------------+--------+------------------+-------+  -Right Hilum RI     -0.69    -Left Hilum RI     -0.56   -  +-------------------+--------+------------------+-------+  -Right Hilum Time   -61 msec -Left Hilum Time   -83 msec-  +-------------------+--------+------------------+-------+  -Right Kidney Length-10.54 cm-Left Kidney Length-9.93 cm-  +-------------------+--------+------------------+-------+  -Right Kidney RI    -0.65    -Left  Kidney RI    -0.52   -  +-------------------+--------+------------------+-------+  -Right RAR at celiac-2.23    -Left RAR at celiac-5.83   -  +-------------------+--------+------------------+-------+  -Right RAR at SMA   -2.93    -Left RAR at SMA   -7.67   -    Corean Door, MD  General Surgery PGY-1

## 2023-12-11 NOTE — Unmapped (Addendum)
 Follow up in 6 months with ultrasound of your kidney arteries to make sure your stent is ok

## 2023-12-24 DIAGNOSIS — C9101 Acute lymphoblastic leukemia, in remission: Principal | ICD-10-CM

## 2023-12-24 NOTE — Unmapped (Signed)
 [facility] Specialty and Home Delivery Pharmacy Refill Coordination Note    Adam Keith, DOB: 1980-04-25  Phone: [phone] (work)      All above HIPAA information was verified with patient.         12/23/2023     8:56 AM   Specialty Rx Medication Refill Questionnaire   Which Medications would you like refilled and shipped? 15mg  Iclusig  Ponatnib   Please list all current allergies: See MyChart   Have you missed any doses in the last 30 days? No   Have you had any changes to your medication(s) since your last refill? No   How much of each medication do you have remaining at home? (eg. number of tablets, injections, etc.) 6 days   Have you experienced any side effects in the last 30 days? No   Please enter the full address (street address, city, state, zip code) where you would like your medication(s) to be delivered to. [address] [place], [place]  [address]   Please specify on which day you would like your medication(s) to arrive. Note: if you need your medication(s) within 3 days, please call the pharmacy to schedule your order at [phone]  12/25/2023   Has your insurance changed since your last refill? No   Would you like a pharmacist to call you to discuss your medication(s)? No   Do you require a signature for your package? (Note: if we are billing Medicare Part B or your order contains a controlled substance, we will require a signature) No   I have been provided my out of pocket cost for my medication and approve the pharmacy to charge the amount to my credit card on file. Yes         Completed refill call assessment today to schedule patient's medication shipment from the [facility] and Home Delivery Pharmacy [phone]).  All relevant notes have been reviewed.       Confirmed patient received a Conservation officer, historic buildings and a Surveyor, mining with first shipment. The patient will receive a drug information handout for each medication shipped and additional FDA Medication Guides as required.         REFERRAL TO PHARMACIST     Referral to the pharmacist: Not needed      [facility]     Shipping address confirmed in Epic.     Delivery Scheduled: Yes, Expected medication delivery date: 12/27/23. 12/24/2023 - Patient originally requested delivery for 12/25/23. Delivery is not possible on this date due to it being a Monday. I have reached out to the patient and confirmed that delivery on 12/26/23 is ok.      Medication will be delivered via UPS to the prescription address in Epic [place].    Adam Keith M Santer Torres   [place] [facility] Delivery Pharm[facility] Specialty Technician

## 2023-12-26 MED FILL — ICLUSIG 15 MG TABLET: ORAL | 30 days supply | Qty: 30 | Fill #1

## 2024-01-03 DIAGNOSIS — D751 Secondary polycythemia: Principal | ICD-10-CM

## 2024-01-08 MED ORDER — OXYCODONE ER 20 MG TABLET,CRUSH RESISTANT,EXTENDED RELEASE 12 HR
ORAL_TABLET | Freq: Three times a day (TID) | ORAL | 0 refills | 28.00000 days | Status: CP
Start: 2024-01-08 — End: 2024-02-05

## 2024-01-08 NOTE — Unmapped (Signed)
 Received fax notification from pharmacy that patient does not have active Medicaid coverage and OxyContin  rx is not covered. Called [facility] to clarify; pharmacy staff shares that Medicaid plan does not appear to be active any longer and OxyContin  is very expensive out of pocket. Resent rx to [facility] for test claim; Medicaid plan still saying not active.    Called patient to discuss; he was not aware of any changes to his Medicaid. Shares he does have VA benefits but that these are only related to disability and not cancer care. Pt plans to call Medicaid today to check his status.

## 2024-01-08 NOTE — Unmapped (Signed)
 Addended by: Carlise Stofer N on: 01/08/2024 08:04 AM     Modules accepted: Orders

## 2024-01-10 DIAGNOSIS — C9101 Acute lymphoblastic leukemia, in remission: Principal | ICD-10-CM

## 2024-01-11 ENCOUNTER — Ambulatory Visit
Admit: 2024-01-11 | Payer: MEDICAID | Attending: Student in an Organized Health Care Education/Training Program | Primary: Student in an Organized Health Care Education/Training Program

## 2024-01-16 DIAGNOSIS — C9101 Acute lymphoblastic leukemia, in remission: Principal | ICD-10-CM

## 2024-01-18 ENCOUNTER — Encounter
Admit: 2024-01-18 | Discharge: 2024-01-19 | Payer: MEDICAID | Attending: Student in an Organized Health Care Education/Training Program | Primary: Student in an Organized Health Care Education/Training Program

## 2024-01-18 DIAGNOSIS — C9101 Acute lymphoblastic leukemia, in remission: Principal | ICD-10-CM

## 2024-01-18 NOTE — Progress Notes (Unsigned)
 OUTPATIENT ONCOLOGY PALLIATIVE CARE     Principal Diagnosis: Adam Keith is a 43 y.o. 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 He was recently admitted to [facility] with lowe extremity pain and edema. Has had episodic fluctuations in mood.  Has decided not to pursue bone marrow transplant at this time. Has felt chronically ill for the last year.  Unclear unfortunately exactly what is contributing to this, like the combination of treatment side effects and refractory depression.  Had started to feel a little bit better with testosterone  supplementation although this benefit seems to be wearing off. Appreciate oncology and psychiatry assistance in management.     Assessment/Plan:      # Depressed Mood: Chronic depression, continues to feel down due to constitutional fatigue, pain, inability to go outside and do things that bring meaning to him.  Continues to follow with [name].  No safety concerns but continues to feel poorly overall.  -Medications per [name]        #Pain: Chronic, persistent pain, primarily in joints. Associated with/exacerbated by sitting too long, though he also says that activity seems to make it worst. Previously trialed butrans  patch but did not find this effective. MRI of the spine showed narrowing in the lumbar region likely causing sciatica-like pain.  Trying to limit opioids, but given level of pain and distress impacting quality of life to address symptoms.  Fortunately feeling much better today.  Only requiring OxyContin  not needing breakthrough oxycodone .    -Start OxyContin  20 mg p.o. every 8 hours  - oxycodone  10 mg q4 prn for breakthrough pain, using 4-6 times a day.  Refill sent yesterday      #Persistent productive cough, ENT infections, low grade fevers  -Not addressed in detail today       #Chronic pain: At baseline, see above  - Pain management as above     #Advance care planning: Continues to prioritize quality of life.  After discussion with infectious disease as well as oncology has made the decision not to pursue another bone marrow transplant at this time.  Reviewed previously if he is considering not continuing cancer directed therapy.  He states that despite how poorly he has been feeling he is not at that stage yet.     #Controlled substances risk management:  Patient does not have a signed pain medication agreement with our team.  [place] database was reviewed today and it was appropriate.  Urine drug screen was not performed at this visit. Findings: not applicable.  Patient has received information about safe storage and administration of medications.  Patient has not received a prescription for narcan ; is not applicable.      F/u: 6-10 weeks     ----------------------------------------  Referring Provider: Dr. Adina Saba  Oncology Team: Malignant hematology team  PCP: DAVID BELVIE NEEDLE, MD     HPI: 43 year old man with a diagnosis of B-ALL, diagnosed in October 2021, now in complete remission. Recently admitted to the [facility] ICU 03/06/21-03/20/21 for AHRF d/t MRSA/klebsiella PNA, requiring intubation. This is following admissions in August, May, April, March, February, and January 2022.     Interval History:   Unfortunately has had a lapse in his insurance. Has been unable to get his long acting Oxycontin . Pain has been a lot worse. He has been taking the Oxycodone  10mg  every 3-4 hours, during the day. Taking about 4-5 times a day, doesn't take it overnight.     Took 20mg  yesterday  and it made him feel sick. Will stick with 10mg . He is working with Onc SW to try and get his cancer treatment while he is off Medicaid. Chemo is $38k a month.           Palliative Performance Scale: 80% - Ambulation: Full / Normal Activity, some evidence of disease / Self-Care:Full / Intake: Normal / Level of Conscious: Full     Coping/Support Issues: Having a lot of difficulty coping with his current setback. Well supported by his wife. Also has a psychiatrist and therapist.     Goals of Care: Treatment and cancer-directed therapy     Social History:  Name of primary support: Wife Adam Keith, 4 daughters and 1 son. 25, 22, 3 20 year olds.  Occupation: Was in the [employer]  Hobbies: Woodworking  Current [facility] / [facility]: [place]     Has a 47 year old son.      Advance Care Planning: Did not discuss this visit.        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    Onion Anaphylaxis    Vancomycin  Analogues Other (See Comments)       Hearing loss with Lasix      Other reaction(s): Other (See Comments)     Hearing loss     lasix      Hearing loss      lasix       Hearing loss with Lasix  Other reaction(s): Other (See Comments) Hearing loss lasix     Bismuth Subsalicylate Nausea And Vomiting    Privigen  [Immun Glob G(Igg)-Pro-Iga 0-50] Other (See Comments)       03/10/2021 VIG infusion stopped as patient developed rigors, tachycardia and HTN during infusion despite pre-medication; 03/12/21 completed IVIG with premedications and slower rate of infusion    Furosemide  Other (See Comments)       With Vancomycin  caused hearing loss     Other reaction(s): Other (See Comments)     With Vancomycin  caused hearing loss      With Vancomycin  caused hearing loss Other reaction(s): Other (See Comments) With Vancomycin  caused hearing loss    Gammagard            Family History:  Cancer-related family history includes Cancer in his maternal grandfather and mother. There is no history of Melanoma.  He indicated that the status of his mother is unknown. He indicated that the status of his father is unknown. He indicated that the status of his sister is unknown. He indicated that the status of his brother is unknown. He indicated that the status of his maternal grandmother is unknown. He indicated that the status of his maternal grandfather is unknown. He indicated that the status of his paternal grandmother is unknown. He indicated that the status of his paternal grandfather is unknown. He indicated that the status of his maternal aunt is unknown. He indicated that the status of his maternal uncle is unknown. He indicated that the status of his paternal aunt is  unknown. He indicated that the status of his paternal uncle is unknown. He indicated that the status of his neg hx is unknown.        REVIEW OF SYSTEMS:  A comprehensive review of 10 systems was negative except for pertinent positives noted in HPI.           Lab Results   Component Value Date     CREATININE 1.93 (H) 02/22/2022            Lab Results   Component Value Date     ALKPHOS 92 02/22/2022     BILITOT 0.4 02/22/2022     BILIDIR 0.20 02/07/2022     PROT 6.6 02/22/2022     ALBUMIN 4.1 02/22/2022     ALT 96 (H) 02/22/2022     AST 45 (H) 02/22/2022      Physical exam:  Gen: NAD  Pulm: nl WOB  Skin: Dry and red  Psych: Guarded and dysphoric      The patient reports they are physically located in [place]  and is currently: at home. I conducted a audio/video visit. I spent  23m 14s on the video call with the patient. I spent an additional 15 minutes on pre- and post-visit activities on the date of service .       Keyah Blizard, MD  [facility] Outpatient Oncology Palliative Care dysphoric    {    Coding tips - Do not edit this text, it will delete upon signing of note!    Telephone visits [phone] for Physicians and APPs and [phone] for Non- Physician Clinicians)- Only use minutes on the phone to determine level of service.    Video visits [phone]) - Use either level of medical decision making just as an in-person visit OR time which includes both minutes on video and pre/post minutes to determine the level of service.      :[id number]}  The patient reports they are physically located in [place]  and is currently: at home. I conducted a audio/video visit. I spent  0s on the video call with the patient. I spent an additional 15 minutes on pre- and post-visit activities on the date of service .       Kallan Bischoff, MD  [facility] Outpatient Oncology Palliative Care

## 2024-01-19 DIAGNOSIS — C9101 Acute lymphoblastic leukemia, in remission: Principal | ICD-10-CM

## 2024-01-19 NOTE — Unmapped (Signed)
 [facility] Refill Coordination Note    Adam Keith, DOB: 1980/12/03  Phone: [phone] (work)      All above HIPAA information was verified with patient.         01/18/2024    11:45 AM   Specialty Rx Medication Refill Questionnaire   Which Medications would you like refilled and shipped? Inclusig   Please list all current allergies: No new   Have you missed any doses in the last 30 days? No   Have you had any changes to your medication(s) since your last refill? No   How much of each medication do you have remaining at home? (eg. number of tablets, injections, etc.) 5 days   Have you experienced any side effects in the last 30 days? No   Please enter the full address (street address, city, state, zip code) where you would like your medication(s) to be delivered to. [address], [place], [place]  [address]   Please specify on which day you would like your medication(s) to arrive. Note: if you need your medication(s) within 3 days, please call the pharmacy to schedule your order at [phone]  01/23/2024   Has your insurance changed since your last refill? No   Would you like a pharmacist to call you to discuss your medication(s)? No   Do you require a signature for your package? (Note: if we are billing Medicare Part B or your order contains a controlled substance, we will require a signature) No   I have been provided my out of pocket cost for my medication and approve the pharmacy to charge the amount to my credit card on file. Yes   Additional Comments: 1st ship date possible.         Completed refill call assessment today to schedule patient's medication shipment from the [facility] [phone]).  All relevant notes have been reviewed.       Confirmed patient received a Conservation officer, historic buildings and a Surveyor, mining with first shipment. The patient will receive a drug information handout for each medication shipped and additional FDA Medication Guides as required.         REFERRAL TO PHARMACIST     Referral to the pharmacist: Not needed      [facility]     Shipping address confirmed in Epic.     Delivery Scheduled: Yes, Expected medication delivery date: 01/23/24.     Medication will be delivered via UPS to the prescription address in Epic [place].    [name]   [facility] Specialty Technician

## 2024-01-19 NOTE — Unmapped (Signed)
 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   Pt receiving treatment with ponatinib  for his Ph+ ALL.     Financial/Insurance: [place] [employer]  SW called pt to provide introduction, assess needs, and offer support.   Pt said that it was time to re-certify Medicaid but he did not receive a list of the documents needed and thus missed the deadline.  SW checked Medicaid status in [employer] portal and confirmed it is still active through 02/02/2024, inquired whether pt had discussed with his caseworker  Pt said he had called many times and went to DSS in person but has been unable to reach either his caseworker or their supervisor.  SW offered to assist pt to call.   Pt said he had tried again today already, requested to try next week.    Psychosocial/Coping Issues:   No acute distress reported or observed during initial call.     Follow-Up Plan:   SW will be out of the office on Monday 10/20 so will follow up with pt re Medicaid on Tuesday 10/21.   Pt has this SW's contact information and will reach out for additional assistance as needed.      Lauraine Louder, LCSW  Oncology Outpatient Social Worker  [phone]

## 2024-01-22 MED ORDER — OXYCODONE ER 20 MG TABLET,CRUSH RESISTANT,EXTENDED RELEASE 12 HR
ORAL_TABLET | Freq: Three times a day (TID) | ORAL | 0 refills | 28.00000 days | Status: CP
Start: 2024-01-22 — End: 2024-02-19

## 2024-01-22 NOTE — Unmapped (Signed)
 Adam Keith 's ICLUSIG  15 mg tablet (PONATinib ) shipment will be delayed as a result of unresolved issue with insurance coverage.     I have reached out to the patient  at [phone] and communicated the delay. We will call the patient back to reschedule the delivery upon resolution. We have not confirmed the new delivery date.

## 2024-01-23 NOTE — Unmapped (Signed)
 Outpatient Oncology Social Work  Follow Up     SW called pt to follow up re Medicaid re-certification.  Pt said that his Medicaid is now showing as active everywhere for another month but he has been unable to reach a caseworker to learn what he needs to do to re-certify, said he has been calling both morning and afternoon.  SW offered to assist pt to call but pt said he had already tried for today, requested for SW to call back next Tuesday 10/28 since he will be in class all the rest of this week and has a medical appt on Monday 10/27.  Pt has this SW's contact information and will reach out for additional assistance as needed.    Lauraine Louder, LCSW  Oncology Outpatient Social Worker  [phone]

## 2024-01-24 DIAGNOSIS — C9101 Acute lymphoblastic leukemia, in remission: Principal | ICD-10-CM

## 2024-01-24 NOTE — Unmapped (Signed)
 Adam Keith 's ICLUSIG  15 mg tablet (PONATinib ) shipment will be rescheduled as a result of the insurance issue has been resolved. Copay is $4, patient approved.     I have reached out to the patient  at [phone] and communicated the delivery change. We will reschedule the medication for the delivery date that the patient agreed upon.  We have confirmed the delivery date as 01/26/24, via ups.

## 2024-01-25 MED FILL — ICLUSIG 15 MG TABLET: ORAL | 30 days supply | Qty: 30 | Fill #2

## 2024-01-29 MED ORDER — FLUOROURACIL 5 % TOPICAL CREAM
Freq: Two times a day (BID) | TOPICAL | 0 refills | 0.00000 days | Status: CP
Start: 2024-01-29 — End: 2025-01-28

## 2024-01-29 NOTE — Progress Notes (Signed)
 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  2) Diagnosis, etiology, natural history, and treatment discussed, emphasizing the importance of surveillance and photoprotection.   3) Prognosis and future surveillance discussed.  4) Letter with treatment outcome sent to referring provider.       Photodamage/future skin cancer risk: evidenced by rhytids, telangiectasias, and lentigines, and history of skin cancer  1) Currently stable, no concerning lesions on exam today. Actinic damage-sun protection measures discussed/advised.   2) Advise continued surveillance in addition to photoprotection.        HISTORY OF PRESENT ILLNESS: Mr. Weil is seen in consultation at the request of Dr. Christinia  for biopsy-proven SCCIS.   They note that the area has been present for about a few months  increasing in size with scaling and total resolution after the biopsy .  There is no history of previous treatment.  Reports no other new or changing lesions and has no other complaints today.      REVIEW OF SYSTEMS: No fevers, chills, weight loss, lymphadenopathy or other skin concerns.    PHYSICAL EXAMINATION:  GENERAL:  well-appearing in no acute distress, alert, appropriately oriented and interactive.  SKIN: Examination of the relevant anatomic areas notable for no residual lesion on the nose. There are rhytids, telangiectasias, and lentigines, consistent with photodamage.    Biopsy report(s) reviewed, confirming the diagnosis.

## 2024-01-30 NOTE — Progress Notes (Signed)
 Outpatient Oncology Social Work  Follow Up     SW called pt to follow up re Medicaid re-certification.   Pt said that he had finally reached a supervisor and was told the caseworker should be reaching out this week, has supervisor's number to call back if not.  SW also provided [facility] Medicaid Ombudsman contact information in case pt is unable to get this resolved in time to maintain coverage.   Pt has this SW's contact information and will reach out for additional assistance as needed.    Lauraine Louder, LCSW  Oncology Outpatient Social Worker  [phone]

## 2024-02-01 DIAGNOSIS — H524 Presbyopia: Principal | ICD-10-CM

## 2024-02-01 DIAGNOSIS — H539 Unspecified visual disturbance: Principal | ICD-10-CM

## 2024-02-01 NOTE — Progress Notes (Signed)
 Adam Keith is a 43 y.o. y/o male, who presents to neuro-ophthalmology clinic on February 01, 2024 due to diplopia.    REFERRED BY: Alyce Shaggy, AGNP at [facility] Hematology oncology clinic    Patient Care Team:  Epifanio Alm Dover, MD as PCP - General  Clinic, Maryl Colon, Delon LABOR, RN as Registered Nurse (Palliative Medicine)  Ancil Mungo, MD as Consulting Physician (Palliative Medicine)  Ina Eleanor Moors, PMHNP as Nurse Practitioner (Psychiatry)  Shaggy Alyce Debby ELNITA as Nurse Practitioner (Hematology and Oncology)   Fredna Rockey Murphy, MD as Consulting Physician (Palliative Medicine)  Jamieson, Katarzyna Joanna, MD as Consulting Physician (Hematology and Oncology)  Estelle Toribio SAUNDERS, MD as Attending Provider (Hematology)  Paola Irving SAUNDERS, RN as Nurse Navigator (Hematology and Oncology)    Ophthalmic Technician preparing this note: Sunday.    REASON FOR VISIT (Chief Complaint): 43 yo male here today consultation requested by Alyce Shaggy, AGNP for a Neuro-Ophthalmology eye exam to evaluate diplopia.    Currently on ponatinib  monotherapy.  Couple months ago started having episodes of bilateral blurry vision at near and distance lasting couple days. Occasionally sees colored floaters both eyes. Pt does report history of migraines but has not had an episode in a few years. Denies dark spots in vision, flashes of light, TVOs, headaches.       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  - candida chorioretinitis right eye     PMHx:  - He  has a past medical history of ALL (acute lymphoblastic leukemia) (CMS-HCC), COVID-19 (05/31/2020), Hypertension, MRSA bacteremia (01/28/2020), Pneumonia of right lung due to methicillin resistant Staphylococcus aureus (MRSA)    (CMS-HCC) (03/09/2021), Red blood cell antibody positive (02/14/2020), Septic shock due to Staphylococcus aureus    (CMS-HCC) (01/28/2020), Tear of medial meniscus of knee (10/25/2016), and Testicular pain, left (01/21/2022).  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    (CMS-HCC); Red blood cell antibody positive; Recurrent major depressive disorder, in remission; PTSD (post-traumatic stress disorder); Anxiety; Insomnia; Immunocompromised (HHS-HCC); Hypogammaglobulinemia (HHS-HCC); ALL (acute lymphoblastic leukemia) (CMS-HCC); Chronic pain; COPD (chronic obstructive pulmonary disease) (CMS-HCC); Acute lymphoblastic leukemia in remission    (CMS-HCC); Incarcerated umbilical hernia; Late effect of fracture of multiple bones; Low back pain; Migraine; Obesity; Pain in joint involving ankle and foot; Panic attacks; CKD (chronic kidney disease); Hypersensitivity; Bilateral lower extremity edema; Popliteal DVT (deep venous thrombosis)    (CMS-HCC); Hypertension; Acute hypoxic respiratory failure    (CMS-HCC); Subacute pulmonary embolism    (CMS-HCC); Obstructive sleep apnea; Fatigue; Chills; Dyspnea; Ear pain; Jaw pain; Otitis media; and Erythrocytosis on their problem list.    Surgical Hx:  - He  has a past surgical history that includes Bone Marrow Biopsy & Aspiration (01/21/2020); IR Insert Port Age Greater Than 5 Years (03/10/2020); pr bronchoscopy,diagnostic w lavage (Bilateral, 07/20/2020); and chg us , chest,real time (11/20/2020).    Medication List:  - He has a current medication list which includes the following prescription(s): albuterol , amlodipine , wrist brace, carvedilol , cholecalciferol  (vitamin d3-250 mcg (10,000 unit)), docusate sodium, doxycycline , eliquis , fluorouracil, naloxone , nortriptyline , omeprazole, ondansetron , oxycodone , oxycodone , ponatinib , spironolactone , sulfamethoxazole -trimethoprim , bd eclipse luer-lok, testosterone  cypionate, stiolto respimat , stiolto respimat , stiolto respimat , valacyclovir , clopidogrel , and hydrochlorothiazide .  Allergies:  - He is allergic to bupropion hcl, cefepime , ceftaroline fosamil, dapsone, onion, vancomycin  analogues, bismuth subsalicylate, privigen  [immun glob g(igg)-pro-iga 0-50], furosemide , and gammagard .    Social Hx:  - He  reports that he has been smoking cigarettes. He has been exposed to tobacco smoke. He has quit using smokeless tobacco.  His smokeless tobacco use included chew. He reports that he does not currently use alcohol. He reports that he does not use drugs.    Family Hx/Pedigree/Heredogram:  - He family history includes Cancer in his maternal grandfather and mother; No Known Problems in his brother, father, maternal aunt, maternal grandmother, maternal uncle, paternal aunt, paternal grandfather, paternal grandmother, paternal uncle, and sister.    Recent Relevant Labs:  - NA    General Imaging:  MRI Brain with and without contrast( 07/02/2022 date):   IMPRESSION  --Signal normality in the left middle ear with left mastoid effusion. Findings are nonspecific though can be seen with otitis media. There is no evidence of intracranial extension, and no evidence of restricted diffusion to suggest cholesteatoma.    EXAM TODAY    - BCVA: Uncorrected distance visual acuity was 20/30 in the right eye and 20/20 in the left eye. Uncorrected near visual acuity was J1+ in the right eye and J1+ in the left eye.  Not recorded       Color       Color         Right Left    [place] 11/11 11/11                  Pupils       Pupils         Dark Shape React APD    Right 3 Round Brisk None    Left 3 Round Brisk None                  Tonometry       Tonometry ([facility], 10:43 AM)         Right Left    Pressure 18 17                    ANCILLARY TESTING  - OCT Retinal Nerve Fiber Layer (RNFL): slightly thick inf both eyes but symmetric  - OCT macular Ganglion Cell Complex [facility]): 80//80    ASSESSMENT    # Visual disturbance  - ddx include acephalgic migraine vs ocular strain/spasm  - onset 09/2023;   - last episode 12/2023.  - episodes include sudden onset blurry vision in both eyes; blrury for distance and near. Improves after 1 or 2 days. Improvement always noted on awakening.  - normal neuro ophthalmological exam= normal RNFL/GCL./motility/VA    PLAN    # Brain MRI w wo ; MRA head wo      # Artificial tears ordered for eye lubrication 3-4 times a day.  - Refresh Optive Advance: Use 1 drop 4 times daily.    # Return in about 5 weeks (around 03/07/2024) for DFE; MRI review..    Patient has been advised regarding signs and symptoms  suggestive of worsening or new problem, to report to the [facility]   Emergency Room immediately.    Advised if there are further questions they can contact us  via [email or web address] or  calling our clinical office number [phone] [address], Environmental Health Practitioner).    Resident assisting with Chart: Lonni KANDICE Burns, MD    PREPARING FOR YOUR NEXT NEURO-OPHTHALMOLOGY EVALUATION  Please be advised that Neuro-Ophthalmology evaluation is a highly specialized and thorough  process. The time requirement for visits ranges from 3-5 hours. This includes registration,  technician evaluation, pupils dilation, special testing, attending/fellow chart review and  clinical encounter with attending provider.  1. Request that your treating physicians send all relevant information to the neuro-  ophthalmologist prior to your appointment, including office notes, results of laboratory  tests and reports of CT and MRI scans.  2. If you have had a CT or MRI scan performed, arrange to pick up the actual films and  bring them with you, or have the facility mail them to the neuro-ophthalmologist in  advance or your appointment.  3. You will probably have your pupils dilated during the visit. The eye drops last about 4  hours and will make things look bright and blurry up close. Have someone else drive you  to the appointment and bring your sunglasses.  4. Ladies, in order for the physician to get a good look at your eyelids and to avoid ruining  your appearance when the eye drops are administered, do not wear eye makeup.  5. Bring a complete list of medications with you, including the name and dosage of  prescription and over-the-counter medications.    WHAT WILL HAPPEN DURING YOUR NEXT EVALUATION?  1. The neuro-ophthalmologic evaluation is one of the most comprehensive examinations  you will experience. It may take a few hours to complete. You will be asked to give an  account of your current problem and relate your entire medical history, including  previous hospitalizations, operations, serious illnesses, medical problems in your family  members, and medication allergies.   2. You will have a complete eye examination. This may include testing of your peripheral  vision (visual field test).  3. You may have a partial or complete neurologic exam to test your strength, sensation,  and coordination.   4. The neuro-ophthalmologist will review the records and scans from previous evaluations,  if applicable.   5. After the examination, the neuro-ophthalmolgist will discuss the diagnosis (or possible  diagnoses), the need for any additional testing and possible treatment.

## 2024-02-01 NOTE — Patient Instructions (Signed)
#   Artificial tears ordered for eye lubrication 3-4 times a day.  - Refresh Optive Advance: Use 1 drop 4 times daily.

## 2024-02-02 DIAGNOSIS — C9101 Acute lymphoblastic leukemia, in remission: Principal | ICD-10-CM

## 2024-02-05 DIAGNOSIS — C9101 Acute lymphoblastic leukemia, in remission: Principal | ICD-10-CM

## 2024-02-09 ENCOUNTER — Inpatient Hospital Stay: Admit: 2024-02-09 | Discharge: 2024-02-09

## 2024-02-09 DIAGNOSIS — H524 Presbyopia: Principal | ICD-10-CM

## 2024-02-09 DIAGNOSIS — H539 Unspecified visual disturbance: Principal | ICD-10-CM

## 2024-02-09 MED ADMIN — gadopiclenol (ELUCIREM,VUEWAY) injection 10 mL: 10 mL | INTRAVENOUS | @ 17:00:00 | Stop: 2024-02-09

## 2024-02-15 ENCOUNTER — Encounter: Admit: 2024-02-15 | Discharge: 2024-02-16 | Attending: Psychiatric/Mental Health | Primary: Psychiatric/Mental Health

## 2024-02-15 DIAGNOSIS — F419 Anxiety disorder, unspecified: Principal | ICD-10-CM

## 2024-02-15 DIAGNOSIS — C9101 Acute lymphoblastic leukemia, in remission: Principal | ICD-10-CM

## 2024-02-15 DIAGNOSIS — R5383 Other fatigue: Principal | ICD-10-CM

## 2024-02-15 DIAGNOSIS — R41840 Attention and concentration deficit: Principal | ICD-10-CM

## 2024-02-15 NOTE — Progress Notes (Signed)
 [facility]  Psychiatry--Comprehensive Cancer Support Program   Established Patient E&M Service - Outpatient       Assessment:    Adam Keith presents for follow-up evaluation. He states that he is experiencing depression with associated irritability, anhedonia, loss of appetite with recent 70 pound weight loss, and poor concentration. Increased irritability and frustration with physical limitations and attention/focus issues. Denies hopelessness or suicidal ideation. Anhedonia and poor concentration affect his daily activities. We explored the potential of augmenting his nortriptyline  100 mg at bedtime with mirtazapine  to target mood, anxiety, sleep, and appetite, but patient did not want to revisit mirtazapine  because it was too sedating in the past. He requested a trial of a stimulant to help with attention/concentration and fatigue. He has an old bottle of methylphenidate  5 mg tablets and he will start with taking it BID for the first week and potentially increasing his dose to 10 mg BID. He will continue his nortriptyline  100 mg at bedtime. Will plan to reassess on 03/07/24.    Identifying Information:  Adam Keith is a 43 y.o. male with a history of PH+ B cell ALL, recurrent major depressive disorder, PTSD, anxiety, and insomnia. He has had a long struggle with intermittent depressive episodes with one suicide attempt at age 16.      Risk Assessment:  An assessment of suicide and violence risk factors was performed as part of this evaluation and is not significantly increased from the last visit.   While future psychiatric events cannot be accurately predicted, the patient does not currently require acute inpatient psychiatric care and does not currently meet [place]  involuntary commitment criteria.      Plan:    Problem 1: Major Depressive Disorder  Status of problem: worsening  Interventions:   Continue nortriptyline  100 mg at bedtime.      Encouraged psychotherapy with local therapist     Problem 2: Anxiety/PTSD  Status of problem: chronic with mild exacerbation  Interventions:   See MDD above     Problem 3: Insomnia  Status of problem: worsening  Interventions:   See nortriptyline  plan above    Problem 4: Attention/Concentration issues   Status of problem:  new problem to this provider  Interventions:  Start methylphenidate  5 mg BID. Explained to patient that he can increase his dose to 10 mg BID if he finds that the 5 mg is not effective. No prescription needed as he has an old bottle of 5 mg tablets left over from last year that he would like to use first.       Psychotherapy provided:  No billable psychotherapy service provided.    Patient has been given this writer's contact information as well as the [facility] Psychiatry urgent line number. The patient has been instructed to call 911 for emergencies.      Subjective:    Chief complaint:  Follow-up psychiatric evaluation for depression, anxiety, and insomnia    Interval History:   He presents with worsening irritability, poor focus, low energy, sleep disturbance, and decreased appetite.    Increasing irritability and anger have led him to describe frequent episodes of snapping at others and feeling upset by minor frustrations. He becomes particularly angry when unable to complete tasks or when feeling physically worn out. He notes that these experiences have placed him in a 'dark place' recently, though he denies current hopelessness. A persistent sense of aggravation related to his limitations and inability to function as he once did continues to  affect him.    Significant difficulty with focus and concentration prevents him from reading books, completing puzzles, or finishing tasks. He states that his mind frequently wanders, making it hard to start or complete activities. Attempts to use audiobooks have not helped, as he cannot maintain attention. A lack of drive and energy has made him unable to sustain interest in activities he previously enjoyed, such as woodworking, and he often loses interest after a short period.    Disrupted sleep remains a concern, with him typically waking up 4 to 5 times per night and sometimes only getting 2 hours of sleep. On rare occasions, such [place], he manages to sleep for up to 12 hours with minimal awakenings. He expresses concern about using medications that make him sleep excessively or cause difficulty waking up.    A loss of appetite has made him lose over 70 pounds unintentionally over the past 3 months.    Ongoing low mood and occasional thoughts about mortality, particularly after the recent death of a close friend, continue to affect him, but he denies current suicidal ideation. While he sometimes has thoughts about not wanting to live, these are less frequent and he is able to move past them.    Ongoing anxiety remains present, though he feels unsure if his symptoms are due to anxiety or anger. He describes his anxiety as feeling out of control and 'out of body,' but states that his current symptoms feel different and are more related to irritability.    He currently takes nortriptyline  100 mg daily. He has methylphenidate  5 mg tablets available, which he previously took for 3 or 4 days but discontinued due to feeling jittery. He also has a history of taking mirtazapine  within the past year, which he discontinued because it caused excessive sedation and prolonged sleep. He has not taken testosterone  replacement injections for the past 6 to 7 months, but he may revisit this plan.    Prior Psychiatric Medication Trials: bupropion (could not tolerate), duloxetine , mirtazapine  (given during recent hospitalization), lorazepam , clonazepam , methylphenidate  (jittery)     Psychiatric Review of Systems  Depressive Symptoms: depressed mood, fatigue, insomnia  Manic Symptoms: N/A  Psychosis Symptoms: N/A  Anxiety Symptoms: excessive anxiety at times, irritability  Panic Symptoms: denies  PTSD Symptoms: denies  Sleep disturbance: frequent wakening with difficulty getting back to sleep      Objective:      Mental Status Exam:  Appearance:    Appears stated age, Well nourished, and Clean/Neat   Motor:   No abnormal movements   Speech/Language:    Normal rate, volume, tone, fluency   Mood:   OK   Affect:   Blunted, Calm, Cooperative, and Depressed   Thought process and Associations:   Logical, linear, clear, coherent, goal directed   Abnormal/psychotic thought content:     Denies SI, HI, self harm, delusions, obsessions, paranoid ideation, or ideas of reference   Perceptual disturbances:     Denies auditory and visual hallucinations, behavior not concerning for response to internal stimuli     Other:   Insight and judgment intact     I personally spent 43 minutes face-to-face and non-face-to-face in the care of this patient, which includes all pre, intra, and post visit time on the date of service.    Visit was completed by video (or phone) and the appropriate disclaimer has been included below.    The patient reports they are physically  located in [place]  and is currently: at home. I conducted a audio/video visit. I spent  70m 07s on the video call with the patient. I spent an additional 15 minutes on pre- and post-visit activities on the date of service .        Chigozie Basaldua B Shalece Staffa, PMHNP  02/15/2024

## 2024-02-15 NOTE — Patient Instructions (Signed)
 Comprehensive Cancer Support Program (CCSP) - Psychiatry Outpatient Clinic   After Visit Summary    It was a pleasure to see you today in the [facility]s Comprehensive Cancer Support Program (CCSP). The CCSP is a multidisciplinary program dedicated to helping patients, caregivers, and families with cancer treatment, recovery and survivorship.      To schedule, cancel, or change your appointment:  Please call the [facility] schedulers at [phone], Monday through Friday 8AM - 5PM.  Someone will return your call within 24 hours.      If you have a question about your medicines or you need to contact your provider:  First, try sending a My Chart message to [name], PMHNP. If you are unable to do so, please call the CCSP program coordinator, [facility], at [phone].     For after hours urgent issues, you may call [phone] or call the I need to talk line at [phone] ([phone]) anytime 24/7.    CCSP Patient and Family Resource Center: [phone].    CCSP Website:  [email or web address]    For prescription refills, please allow at least 24 hours (during business hours, M-F) for providers to call in refills to your pharmacy. We are generally unable to accommodate same-day requests for refills.     If you are taking any controlled substances (such as anxiety or sleep medications), you must use them as the directions say to use them. We generally do not provide early refills over the phone without clear reason, and it would be inappropriate to obtain the medications from other doctors. We routinely use the [place] controlled substance database to monitor prescription drug use.

## 2024-02-15 NOTE — Progress Notes (Signed)
 [facility] Specialty and Home Delivery Pharmacy Refill Coordination Note    Adam Keith, DOB: 06/05/1980  Phone: [phone] (work)      All above HIPAA information was verified with patient.         02/15/2024     9:27 AM   Specialty Rx Medication Refill Questionnaire   Which Medications would you like refilled and shipped? Inclusig   Please list all current allergies: See MyChart   Have you missed any doses in the last 30 days? No   Have you had any changes to your medication(s) since your last refill? No   How much of each medication do you have remaining at home? (eg. number of tablets, injections, etc.) 7 days i think   Have you experienced any side effects in the last 30 days? No   Please enter the full address (street address, city, state, zip code) where you would like your medication(s) to be delivered to. [address] [place], [place] [address]   Please specify on which day you would like your medication(s) to arrive. Note: if you need your medication(s) within 3 days, please call the pharmacy to schedule your order at [phone]  02/22/2024   Has your insurance changed since your last refill? No   Would you like a pharmacist to call you to discuss your medication(s)? No   Do you require a signature for your package? (Note: if we are billing Medicare Part B or your order contains a controlled substance, we will require a signature) No   I have been provided my out of pocket cost for my medication and approve the pharmacy to charge the amount to my credit card on file. Yes         Completed refill call assessment today to schedule patient's medication shipment from the [facility] Delivery [facility] [phone]).  All relevant notes have been reviewed.       Confirmed patient received a Conservation Officer, Historic Buildings and a Surveyor, Mining with first shipment. The patient will receive a drug information handout for each medication shipped and additional FDA Medication Guides as required. REFERRAL TO PHARMACIST     Referral to the pharmacist: Not needed      [facility]     Shipping address confirmed in Epic.     Delivery Scheduled: Yes, Expected medication delivery date: 02/22/24.     Medication will be delivered via UPS to the prescription address in Epic [place].    Adam Keith   [place] [facility] Specialty Technician

## 2024-02-21 DIAGNOSIS — C9101 Acute lymphoblastic leukemia, in remission: Principal | ICD-10-CM

## 2024-02-21 NOTE — Progress Notes (Signed)
 Adam Keith 's ICLUSIG  15 mg tablet (PONATinib ) shipment will be canceled as a result of the patient's insurance being terminated.  The patient will contact Dr. Toribio Richardson's office to inform them.    I have reached out to the patient  at [phone] and communicated the delay. We will not reschedule the medication and have removed this/these medication(s) from the work request.  We have canceled this work request.

## 2024-02-22 NOTE — Progress Notes (Signed)
 Outpatient Oncology Social Work  Follow Up       Medical team notified SW that pt was having issues with Medicaid again, requested outreach.  SW reviewed pt's Medicaid status in [employer] portal and saw that coverage had ended after October.   SW called pt to follow up re the re-certification process.  Pt said he never heard back from his [place] [employer] caseworker or their supervisor about the process and thought the matter was resolved because his Medicaid was active again, but apparently it was just for a an additional month.  SW encouraged him to go ahead and reach out to the Medicaid Ombudsman to get some backup in getting through to the county [place] team.   Pt agreed to call the Ombudsman and to let SW know if he runs into another wall in communication.   Pt has this SW's contact information and will reach out for additional assistance as needed.    Lauraine Louder, LCSW  Oncology Outpatient Social Worker  [phone]

## 2024-02-26 DIAGNOSIS — C9101 Acute lymphoblastic leukemia, in remission: Principal | ICD-10-CM

## 2024-02-27 DIAGNOSIS — C9101 Acute lymphoblastic leukemia, in remission: Principal | ICD-10-CM

## 2024-02-27 NOTE — Progress Notes (Addendum)
 OUTPATIENT SOCIAL WORKER [LCSW] PROGRESS NOTE: COVERAGE    Service: Adult Hematology Oncology                                                               Patient Adam Keith) is a 43 y.o. male with  a diagnosis of ALL. LCSW spoke with pt by telephone on 02/27/2024. A language interpreter was not requested/needed for conversation.       Referral Source: LCSW providing coverage for pt's LCSW Adam Keith. LCSW received in-basket referral from Adam Keith regarding pt. Medical team requesting LCSW to follow up with pt regarding medicaid status.       Reported or Identified Needs/Stressors: Insurance Coverage Concerns      Assessment/Narrative: LCSW spoke with pt by phone who stated that he was able to speak with his caseworker at [facility] DSS. Pt reported that as per DSS, he is over-income for medicaid and can not submit a new application until after 04/08/24. Pt reported that DSS worker is not accepting new applications until after that date. Pt stated that he was enrolled in the COVID19 medicaid plan and was told that he needed to be enrolled in the cancer medicaid plan within 30 days of his initial diagnosis. Pt reported that his spouse applied for him approximately 4 years ago and he was never made aware of this medicaid plan. Pt stated that he has not contacted the medicaid ombudsman at this time. LCSW provided pt with medicaid ombudsman information and pt stated that he will follow up. LCSW offered to reach out to [facility] worker Adam Keith for possible additional assistance or guidance. Pt agreeable and LCSW reached out to [facility] worker Adam Keith upon request. Pt agreeable for LCSW to attempt to call [facility] DSS on pt behalf. LCSW attempted to call [facility] DSS multiple times. LCSW LM for DSS worker Adam Keith requesting call back. LCSW spoke with pt regarding possible [facility] as he has had [place] [employer] in the past. Pt stated that he is still working but is unsure how much longer he will be able to work due to his medical condition. Pt stated that hsi job does not offer FMLA or STD. Pt reported that he spoke with SSA regarding possible SSD application but was told he must be out of work for a year prior to applying. LCSW provided pt with local SSA office and additional information regarding SSD process. LCSW provided pt with [facility] FAA via secure email as well as legal aid contact information. LCSW provided pt with LCSW contact information and will remain available.     LCSW spoke with Adam Keith with [facility] PAP team to determine if pt is eligible for PAP application. Adam Keith stated that pt is not eligible for PAP due to having insurance through the [place].     1521 LCSW spoke with Adam Keith from [facility] DSS who stated that pt spoke with his caseworker yesterday and was deemed to be over-income for medicaid. Adam Keith reported that pt can submit a medicaid application at any time. Adam Keith stated that they sent a request to pt for additional documents in July 2025 to verify [place] residency and proof of income. Adam Keith reported that pt did not turn in documents and medicaid coverage was terminated on 02/02/24. LCSW  attempted to reach pt to inform of updated conversation. VM is currently full and LCSW unable to LM at this time.     11/26 8:40 LCSW received Mychart message from pt stating that he is in pain and has a message out to the RN NN Irving Margo to see if he could go to the ER for bloodwork and pain medication. LCSW advised pt that if symptoms are unmanageable he should call 911 or go to the nearest ER for medical attention. LCSW also provided pt with RN triage line information via MyChart message. LCSW reached out to RN [place] regarding pt concerns.     Intervention(s):     - Resources provided/referrals made: Medicaid, Medicaid Ombudsman, Legal Aid, and SSD         LCSW Plan for Follow-Up:  LCSW will continue to be available for psychosocial support and resource assistance as needed/appropriate.         Ashley Monks, MSW, LCSW - Ambulatory Social Worker II  [facility] Care Management OP Social Workers  [facility]  [address], [place], [place] [address]  p [phone]

## 2024-02-28 DIAGNOSIS — C9101 Acute lymphoblastic leukemia, in remission: Principal | ICD-10-CM

## 2024-02-28 MED ORDER — MORPHINE ER 30 MG TABLET,EXTENDED RELEASE
ORAL_TABLET | Freq: Two times a day (BID) | ORAL | 0 refills | 30.00000 days | Status: CP
Start: 2024-02-28 — End: ?

## 2024-02-28 NOTE — Progress Notes (Signed)
 Called patient to review insurance issues and medication access.     Patient had Medicaid insurance until the end of October that was covering all oncology care with [facility]. Once expired, attempted to have [employer] cover care, but he does not have an active community care referral from the [place] for oncology care. He has a visit with his PCP on Monday where he will ask them to complete the referral.    In the meantime, patient reports he is out of his ER Oxycodone  and has been out for 2 days. He does having remaining IR oxycodone , though it is not providing much released. He dicussed presenting to the ED for pain management, but it waiting at this time. I advised patient if he feels that he is entering a pain crisis, he can seek management in the ED. Additionally, reached out to palliative care to discuss options for patient having pain medications refilled and paying out of pocket costs for the month.     Provided patient with financial navigation contact information to discuss Medicaid issues. He has worked with social work as well who provided resources for him.     Patient is in agreement with the above plan and will provide updates once he meets with his PCP.

## 2024-02-28 NOTE — Telephone Encounter (Addendum)
 Pt has recently lost his [place] Medicaid, ONC [facility] [place] spoke to pt, see note.   He missed his recertification for medicaid and the DSS office informed him that he is over-income to reapply for medicaid. They stated that they are not taking new applications until 04/08/24 but he will likely not qualify due to income. I provided him with the number to the [facility]. I spoke with Lavern with the PAP team earlier today. Unfortunately since he still has [employer] he does not qualify for PAP as per Alex. I know the pharmacy team is working on a MAP application for him but I do not know any other resources other than a discount card for medications.     RN called [facility], they have historically billed medicaid for his oxycontin  and oxycodone , they do not have any other insurance on file for him at this time.     RN asked pt to call his VA insurance to ask if he has a pharmacy benefit on his [place] plan, if not, or team may need to switch his ER med to something more affordable.  Dr Lavin aware.    Spoke to pt is is OK to switch from Oxycontin  20 mg (noted it was working very well for him re pain control)  TID to MS Contin  30 mg BID for now while his insurance is getting worked out. He will stay on the oxy IR 10 mg tabs for now and he has enough to get through the weekend.   RN educated about possible SE of itching and can can take benadryl  or atarax  if needed. He states he has had morphine  in past and been fine with it.     He will see his PCP on Monday and will ask him to send referral to [facility] to request they send a new referral to [facility] under cancer care to continue his tx under VA insurance. His VA insurance was just activated this year.

## 2024-02-28 NOTE — Progress Notes (Signed)
 30 DAY TEMP DENIED HAS ACTIVE VA BENEFITS

## 2024-03-04 DIAGNOSIS — D751 Secondary polycythemia: Principal | ICD-10-CM

## 2024-03-07 ENCOUNTER — Encounter: Admit: 2024-03-07 | Discharge: 2024-03-08 | Attending: Psychiatric/Mental Health | Primary: Psychiatric/Mental Health

## 2024-03-07 DIAGNOSIS — R5383 Other fatigue: Principal | ICD-10-CM

## 2024-03-07 DIAGNOSIS — F419 Anxiety disorder, unspecified: Principal | ICD-10-CM

## 2024-03-07 DIAGNOSIS — F331 Major depressive disorder, recurrent, moderate: Principal | ICD-10-CM

## 2024-03-07 MED ORDER — ARIPIPRAZOLE 2 MG TABLET
ORAL_TABLET | Freq: Every day | ORAL | 1 refills | 30.00000 days | Status: CP
Start: 2024-03-07 — End: 2024-05-06

## 2024-03-07 NOTE — Progress Notes (Signed)
 [facility]  Psychiatry--Comprehensive Cancer Support Program   Established Patient E&M Service - Outpatient       Assessment:    Adam Keith presents for follow-up evaluation. He states that he is struggling with moderate depressive symptoms, anger, fatigue, and anxiety. He no longer has Medicaid and he doesn't qualify for [facility] PAP due to having VA insurance. He reports that his new PCP through the [place] wants him to be followed by pain management and wean off of his opioids, which further angers him because he has ongoing pain. He experienced a paradoxical reaction to methylphenidate  and found that it worsened his fatigue, so the methylphenidate  was stopped. Augmenting his nortriptyline  100 mg with aripiprazole  2 mg to better target his depression. Will see if improved depression also improves his fatigue. Given his insurance issues, we are not scheduling a follow up appointment at this time. He plans to communicate with me through My Chart to let me know how it is going with the aripiprazole . If this medication is helpful, he will see if his PCP can prescribe it moving forward. Available as needed.     Identifying Information:  Adam Keith is a 43 y.o. male with a history of PH+ B cell ALL, recurrent major depressive disorder, PTSD, anxiety, and insomnia. He has had a long struggle with intermittent depressive episodes with one suicide attempt at age 34.      Risk Assessment:  An assessment of suicide and violence risk factors was performed as part of this evaluation and is not significantly increased from the last visit.   While future psychiatric events cannot be accurately predicted, the patient does not currently require acute inpatient psychiatric care and does not currently meet [place]  involuntary commitment criteria.      Plan:    Problem 1: Major Depressive Disorder  Status of problem: chronic with moderate exacerbation  Interventions:   Continue nortriptyline  100 mg at bedtime. Start aripiprazole  2 mg. The risks, benefits, and alternatives to this medication were discussed with the patient who consents to the above plan.    Encouraged psychotherapy with local therapist     Problem 2: Anxiety/PTSD  Status of problem: chronic with mild exacerbation  Interventions:   See MDD above     Problem 3: Insomnia  Status of problem: improved  Interventions:   See nortriptyline  plan above     Problem 4: Attention/Concentration issues   Status of problem:  ongoing issue  Interventions:  Patient had a paradoxical reaction to methylphenidate , so this medication was stopped. Patient does not wish to pursue other medication trials at this time.      Psychotherapy provided:  No billable psychotherapy service provided.    Patient has been given this writer's contact information as well as the [facility] Psychiatry urgent line number. The patient has been instructed to call 911 for emergencies.      Subjective:    Chief complaint:  Follow-up psychiatric evaluation for depression, anxiety, and fatigue    Interval History:   He describes experiencing moderate depression, currently overshadowed by significant anger and frustration due to ongoing challenges with insurance coverage (he recently lost his Medicaid and the [place] is wanting him to switch all of his care to the [place]) and changes in his pain management regimen (patient could not afford prior pain regimen and he is finding that the morphine  is not providing adequate relief). He states that his mood has been more angry than depressed lately, linking this to  the stress of dealing with Medicaid loss, VA referrals, and concerns about access to effective pain medication. He also reports anxiety related to these ongoing stressors.    He denies feelings of hopelessness and worthlessness. When asked about thoughts of self-harm, he states that if his pain medications were taken away, he would not want to live. He reports telling others that he would not be able to continue if his pain medications were discontinued. He notes increased pain since switching from Oxycontin  to morphine  due to insurance changes, and he describes morphine  as less effective, stating it only takes the top edge off.    He reports ongoing anhedonia, stating he has lost interest in activities he once enjoyed, with his current focus consumed by managing his medical and insurance issues. He reports persistent fatigue. He notes that he is not as tired since stopping methylphenidate  but still experiences significant fatigue. He reports that sleep remains adequate, with an average of 9 hours per night when not working and 6 to 7 hours when working, and he occasionally sleeps up to 12 hours if not awakened.    He currently takes nortriptyline  for depression, which is now filled through the [place]. He describes a history of paradoxical reactions to medications, including methylphenidate  and doxycycline .    Prior Psychiatric Medication Trials: bupropion (could not tolerate), duloxetine , mirtazapine  (given during recent hospitalization), lorazepam , clonazepam , methylphenidate  (paradoxical reaction)     Psychiatric Review of Systems  Depressive Symptoms: depressed mood, fatigue, anhedonia  Manic Symptoms: N/A  Psychosis Symptoms: N/A  Anxiety Symptoms: excessive anxiety and worry, irritability  Panic Symptoms: denies  PTSD Symptoms: denies  Sleep disturbance: denies      Objective:      Mental Status Exam:  Appearance:    Appears stated age, Well nourished, and Clean/Neat   Motor:   No abnormal movements   Speech/Language:    Normal rate, volume, tone, fluency   Mood:   I'm angry   Affect:   Angry, Cooperative, and Irritable   Thought process and Associations:   Logical, linear, clear, coherent, goal directed   Abnormal/psychotic thought content:     Denies SI, HI, self harm, delusions, obsessions, paranoid ideation, or ideas of reference   Perceptual disturbances:     Denies auditory and visual hallucinations, behavior not concerning for response to internal stimuli     Other:   Insight and judgment intact     I personally spent 30 minutes face-to-face and non-face-to-face in the care of this patient, which includes all pre, intra, and post visit time on the date of service.    Visit was completed by video (or phone) and the appropriate disclaimer has been included below.    The patient reports they are physically located in [place]  and is currently: at home. I conducted a audio/video visit. I spent  76m 19s on the video call with the patient. I spent an additional 15 minutes on pre- and post-visit activities on the date of service .        Keiffer Piper B Anuj Summons, PMHNP  03/07/2024

## 2024-03-07 NOTE — Patient Instructions (Signed)
 Comprehensive Cancer Support Program (CCSP) - Psychiatry Outpatient Clinic   After Visit Summary    It was a pleasure to see you today in the [facility]s Comprehensive Cancer Support Program (CCSP). The CCSP is a multidisciplinary program dedicated to helping patients, caregivers, and families with cancer treatment, recovery and survivorship.      To schedule, cancel, or change your appointment:  Please call the [facility] schedulers at [phone], Monday through Friday 8AM - 5PM.  Someone will return your call within 24 hours.      If you have a question about your medicines or you need to contact your provider:  First, try sending a My Chart message to [name], PMHNP. If you are unable to do so, please call the CCSP program coordinator, [facility], at [phone].     For after hours urgent issues, you may call [phone] or call the I need to talk line at [phone] ([phone]) anytime 24/7.    CCSP Patient and Family Resource Center: [phone].    CCSP Website:  [email or web address]    For prescription refills, please allow at least 24 hours (during business hours, M-F) for providers to call in refills to your pharmacy. We are generally unable to accommodate same-day requests for refills.     If you are taking any controlled substances (such as anxiety or sleep medications), you must use them as the directions say to use them. We generally do not provide early refills over the phone without clear reason, and it would be inappropriate to obtain the medications from other doctors. We routinely use the [place] controlled substance database to monitor prescription drug use.

## 2024-03-11 DIAGNOSIS — C9101 Acute lymphoblastic leukemia, in remission: Principal | ICD-10-CM

## 2024-03-11 MED ORDER — PONATINIB 15 MG TABLET
ORAL_TABLET | Freq: Every day | ORAL | 5 refills | 30.00000 days | Status: CP
Start: 2024-03-11 — End: ?

## 2024-03-12 DIAGNOSIS — C9101 Acute lymphoblastic leukemia, in remission: Principal | ICD-10-CM

## 2024-03-13 NOTE — Telephone Encounter (Signed)
 My chart sent to pt today:    Sounds good, you can start this increase from your existing home supply, and let us  know once you get about 3-4 days before running out and we will send a new Rx. Also let us  know after 3-4 days if pain has improved, thanks Mr Sermersheim.    Randall Colon RN BSN OCN MS    Mychart sent 03/12/24 as per Dr Lavin recommendations due to poor pain control:    Hello Mr Sally,      Looping back from your questions about the pain meds not working.  Since you are somewhat limited in what you can/are able to take, Dr Lavin suggested increasing the long acting morphine  30 mg to three times a day (AM, mid day, and PM).     Is this something you 'de like to trial?     Randall Colon RN BSN OCN MS

## 2024-03-14 NOTE — Progress Notes (Signed)
 Specialty Medication(s): Iclusig     Adam Keith has been dis-enrolled from the [facility] specialty pharmacy services as a result of a pharmacy change. The patient is now filling at [facility].    Additional information provided to the patient: NA    Adam Keith, [name]  [facility] Specialty Pharmacist

## 2024-03-14 NOTE — Progress Notes (Signed)
 Adam Keith has been contacted in regards to their refill of Iclusig . At this time, they have declined refill due to discontinued at our pharmacy.  No longer has medicaid and is trying to get at [place].

## 2024-03-25 MED ORDER — MORPHINE ER 30 MG TABLET,EXTENDED RELEASE
ORAL_TABLET | Freq: Three times a day (TID) | ORAL | 0 refills | 30.00000 days | Status: CP
Start: 2024-03-25 — End: ?

## 2024-03-29 DIAGNOSIS — F331 Major depressive disorder, recurrent, moderate: Principal | ICD-10-CM

## 2024-03-29 MED ORDER — ARIPIPRAZOLE 2 MG TABLET
ORAL_TABLET | Freq: Every day | ORAL | 1 refills | 0.00000 days
Start: 2024-03-29 — End: ?

## 2024-03-31 DIAGNOSIS — C9101 Acute lymphoblastic leukemia, in remission: Principal | ICD-10-CM

## 2024-04-01 ENCOUNTER — Ambulatory Visit: Admit: 2024-04-01 | Discharge: 2024-04-02

## 2024-04-01 DIAGNOSIS — C9101 Acute lymphoblastic leukemia, in remission: Principal | ICD-10-CM

## 2024-04-01 MED ORDER — ARIPIPRAZOLE 2 MG TABLET
ORAL_TABLET | Freq: Every day | ORAL | 1 refills | 90.00000 days | Status: CP
Start: 2024-04-01 — End: ?

## 2024-04-11 ENCOUNTER — Encounter
Admit: 2024-04-11 | Discharge: 2024-04-12 | Attending: Student in an Organized Health Care Education/Training Program | Primary: Student in an Organized Health Care Education/Training Program

## 2024-04-11 DIAGNOSIS — C9101 Acute lymphoblastic leukemia, in remission: Principal | ICD-10-CM

## 2024-04-11 MED ORDER — OXYCODONE 10 MG TABLET
ORAL_TABLET | Freq: Three times a day (TID) | ORAL | 0 refills | 10.00000 days | Status: CP | PRN
Start: 2024-04-11 — End: ?

## 2024-04-12 DIAGNOSIS — C9101 Acute lymphoblastic leukemia, in remission: Principal | ICD-10-CM

## 2024-04-15 DIAGNOSIS — C9101 Acute lymphoblastic leukemia, in remission: Principal | ICD-10-CM

## 2024-04-17 DIAGNOSIS — C9101 Acute lymphoblastic leukemia, in remission: Principal | ICD-10-CM

## 2024-04-18 DIAGNOSIS — C9101 Acute lymphoblastic leukemia, in remission: Principal | ICD-10-CM

## 2024-04-18 MED ORDER — OXYCODONE 10 MG TABLET
ORAL_TABLET | Freq: Three times a day (TID) | ORAL | 0 refills | 10.00000 days | Status: CP | PRN
Start: 2024-04-18 — End: ?

## 2024-04-19 ENCOUNTER — Encounter: Admit: 2024-04-19 | Discharge: 2024-04-20 | Attending: Adult Health | Primary: Adult Health

## 2024-04-19 DIAGNOSIS — D751 Secondary polycythemia: Principal | ICD-10-CM

## 2024-04-19 DIAGNOSIS — E291 Testicular hypofunction: Principal | ICD-10-CM

## 2024-04-19 MED ORDER — TESTOSTERONE CYPIONATE 200 MG/ML INTRAMUSCULAR OIL
INTRAMUSCULAR | 5 refills | 56.00000 days
Start: 2024-04-19 — End: ?

## 2024-04-25 DIAGNOSIS — C9101 Acute lymphoblastic leukemia, in remission: Principal | ICD-10-CM

## 2024-04-30 ENCOUNTER — Encounter: Admit: 2024-04-30 | Discharge: 2024-05-01 | Attending: Infectious Disease | Primary: Infectious Disease

## 2024-04-30 DIAGNOSIS — C9101 Acute lymphoblastic leukemia, in remission: Principal | ICD-10-CM

## 2024-04-30 DIAGNOSIS — J189 Pneumonia, unspecified organism: Principal | ICD-10-CM

## 2024-05-03 ENCOUNTER — Ambulatory Visit: Admit: 2024-05-03 | Discharge: 2024-05-04

## 2024-05-03 ENCOUNTER — Inpatient Hospital Stay: Admit: 2024-05-03 | Discharge: 2024-05-04

## 2024-05-06 ENCOUNTER — Encounter: Admit: 2024-05-06 | Discharge: 2024-05-07 | Attending: Adult Health | Primary: Adult Health

## 2024-05-06 DIAGNOSIS — C9101 Acute lymphoblastic leukemia, in remission: Principal | ICD-10-CM
